# Patient Record
Sex: Male | Born: 1940
Health system: Southern US, Community
[De-identification: ages and names within clinical notes are randomized; demographics above are authoritative.]

## PROBLEM LIST (undated history)

## (undated) DIAGNOSIS — N029 Recurrent and persistent hematuria with unspecified morphologic changes: Secondary | ICD-10-CM

## (undated) DIAGNOSIS — I619 Nontraumatic intracerebral hemorrhage, unspecified: Secondary | ICD-10-CM

## (undated) DIAGNOSIS — I639 Cerebral infarction, unspecified: Secondary | ICD-10-CM

## (undated) DIAGNOSIS — A419 Sepsis, unspecified organism: Secondary | ICD-10-CM

## (undated) DIAGNOSIS — T380X5A Adverse effect of glucocorticoids and synthetic analogues, initial encounter: Secondary | ICD-10-CM

## (undated) DIAGNOSIS — I10 Essential (primary) hypertension: Secondary | ICD-10-CM

## (undated) DIAGNOSIS — F39 Unspecified mood [affective] disorder: Secondary | ICD-10-CM

## (undated) DIAGNOSIS — I209 Angina pectoris, unspecified: Secondary | ICD-10-CM

## (undated) DIAGNOSIS — C439 Malignant melanoma of skin, unspecified: Secondary | ICD-10-CM

## (undated) DIAGNOSIS — C719 Malignant neoplasm of brain, unspecified: Secondary | ICD-10-CM

## (undated) DIAGNOSIS — E7800 Pure hypercholesterolemia, unspecified: Secondary | ICD-10-CM

## (undated) DIAGNOSIS — N400 Enlarged prostate without lower urinary tract symptoms: Secondary | ICD-10-CM

## (undated) DIAGNOSIS — E039 Hypothyroidism, unspecified: Secondary | ICD-10-CM

## (undated) DIAGNOSIS — C4490 Unspecified malignant neoplasm of skin, unspecified: Secondary | ICD-10-CM

## (undated) DIAGNOSIS — I2510 Atherosclerotic heart disease of native coronary artery without angina pectoris: Secondary | ICD-10-CM

## (undated) DIAGNOSIS — G4730 Sleep apnea, unspecified: Secondary | ICD-10-CM

## (undated) DIAGNOSIS — R319 Hematuria, unspecified: Secondary | ICD-10-CM

## (undated) DIAGNOSIS — K219 Gastro-esophageal reflux disease without esophagitis: Secondary | ICD-10-CM

## (undated) DIAGNOSIS — K625 Hemorrhage of anus and rectum: Secondary | ICD-10-CM

## (undated) DIAGNOSIS — I219 Acute myocardial infarction, unspecified: Secondary | ICD-10-CM

## (undated) DIAGNOSIS — Z950 Presence of cardiac pacemaker: Secondary | ICD-10-CM

## (undated) DIAGNOSIS — N289 Disorder of kidney and ureter, unspecified: Secondary | ICD-10-CM

## (undated) DIAGNOSIS — C7800 Secondary malignant neoplasm of unspecified lung: Secondary | ICD-10-CM

## (undated) DIAGNOSIS — R739 Hyperglycemia, unspecified: Secondary | ICD-10-CM

## (undated) DIAGNOSIS — M1990 Unspecified osteoarthritis, unspecified site: Secondary | ICD-10-CM

## (undated) HISTORY — DX: Benign prostatic hyperplasia without lower urinary tract symptoms: N40.0

## (undated) HISTORY — PX: EYE SURGERY: SHX253

## (undated) HISTORY — PX: CARDIAC SURGERY: SHX584

## (undated) HISTORY — DX: Recurrent and persistent hematuria with unspecified morphologic changes: N02.9

## (undated) HISTORY — PX: TONSILLECTOMY: SUR1361

## (undated) HISTORY — PX: JOINT REPLACEMENT: SHX530

## (undated) HISTORY — DX: Hyperglycemia, unspecified: T38.0X5A

## (undated) HISTORY — DX: Nontraumatic intracerebral hemorrhage, unspecified: I61.9

## (undated) HISTORY — DX: Unspecified malignant neoplasm of skin, unspecified: C44.90

## (undated) HISTORY — PX: LYMPH NODE BIOPSY: SHX201

## (undated) HISTORY — DX: Adverse effect of glucocorticoids and synthetic analogues, initial encounter: R73.9

## (undated) HISTORY — DX: Secondary malignant neoplasm of unspecified lung: C78.00

## (undated) HISTORY — DX: Unspecified mood (affective) disorder: F39

## (undated) HISTORY — DX: Hematuria, unspecified: R31.9

## (undated) HISTORY — PX: PACEMAKER INSERTION: SHX728

## (undated) HISTORY — DX: Acute myocardial infarction, unspecified: I21.9

---
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
## 2006-02-15 ENCOUNTER — Encounter: Payer: Self-pay | Admitting: Cardiovascular Disease

## 2006-03-20 ENCOUNTER — Ambulatory Visit (HOSPITAL_BASED_OUTPATIENT_CLINIC_OR_DEPARTMENT_OTHER): Admission: RE | Admit: 2006-03-20 | Discharge: 2006-03-21 | Payer: Self-pay | Admitting: Orthopedic Surgery

## 2008-12-09 HISTORY — PX: KNEE ARTHROSCOPY: SUR90

## 2008-12-23 ENCOUNTER — Ambulatory Visit (HOSPITAL_COMMUNITY): Admission: RE | Admit: 2008-12-23 | Discharge: 2008-12-23 | Payer: Self-pay | Admitting: Orthopedic Surgery

## 2010-07-15 LAB — BASIC METABOLIC PANEL
BUN: 24 mg/dL — ABNORMAL HIGH (ref 6–23)
CO2: 28 mEq/L (ref 19–32)
Chloride: 108 mEq/L (ref 96–112)
Creatinine, Ser: 1.29 mg/dL (ref 0.4–1.5)
Glucose, Bld: 96 mg/dL (ref 70–99)

## 2010-07-15 LAB — HEMOGLOBIN AND HEMATOCRIT, BLOOD: Hemoglobin: 15.4 g/dL (ref 13.0–17.0)

## 2010-08-09 HISTORY — PX: FOOT SURGERY: SHX648

## 2010-08-26 NOTE — Op Note (Signed)
NAME:  SATVIK, PARCO NO.:  [id number]   MEDICAL RECORD NO.:  [id number]          PATIENT TYPE:  AMB   LOCATION:  DSC                          FACILITY:  [facility]   PHYSICIAN:  Leonides Grills, M.D.     DATE OF BIRTH:  May 20, 1940   DATE OF PROCEDURE:  03/20/2006  DATE OF DISCHARGE:                               OPERATIVE REPORT   PREOPERATIVE DIAGNOSES:  1. Right grade 2 posterior tibial tendon insufficiency with valgus      hindfoot malalignment.  2. Right posterior tibial tendon rupture.  3. Hypermobile right first ray.  4. Right tight gastroc.   POSTOPERATIVE DIAGNOSES:  1. Right grade 2 posterior tibial tendon insufficiency with valgus      hindfoot malalignment.  2. Right posterior tibial tendon rupture.  3. Hypermobile right first ray.  4. Right tight gastroc.   OPERATION:  1. Right lengthening calcaneal osteotomy.  2. Right iliac crest bone graft.  3. Right gastroc slide.  4. Right FDL2 navicular tendon transfer.  5. Right plantar flexion first TMP joint fusion with osteotomy.   ANESTHESIA:  General.   SURGEON:  Leonides Grills, M.D.   ASSISTANT:  Evlyn Kanner, PA-C.   ESTIMATED BLOOD LOSS:  Minimal.   TOURNIQUET TIME:  Two hours.   COMPLICATIONS:  None.   DISPOSITION:  Stable to PR.   INDICATIONS:  This is a 70 year old male who has had longstanding hind  foot pain that was interfering with his life to the point that he could  not do what he wants to do.  He was consented for the above procedure.  All risks which include infection, nerve or vessel injury, nonunion,  malunion, hardware rotation, hardware failure, persistence of the pain,  worsening of the pain, prolonged recovery, stiffness, arthritis,  weakness, bleeding, DVT, and possible PE were all explained.  Questions  were encouraged and answered.   DESCRIPTION OF PROCEDURE:  The patient was brought to the operating room  and placed in the supine position after adequate general  endotracheal  tube anesthesia was administered as well as Ancef 1 gram IV piggyback.  The right lower extremity as well as iliac crest bone graft site were  prepped and draped in a sterile manner over a proximally placed thigh  tourniquet.  We started the procedure with a longitudinal incision over  the iliac crest graft site.  Dissection was carried down through skin.  Hemostasis was obtained.  Dissection was carried down directly to crest.  Soft tissue was elevated on the inner and outer table.  An approximately  10 mm wide trapezoidal shaped tricortical graft was then obtained using  a sagittal saw followed by a curved quarter inch osteotome.  Once this  was removed cancellous graft was also obtained for the first TMT joint  fusion. Cancellous graft was placed on the back table as well for later  first TMT joint plantar flexion and fusion.   The area was copiously irrigated with normal saline.  Bone wax was  applied to the bony surfaces.  Marcaine was instilled into the wound.  Deep tissues were closed with  0Vicryl, the subcu was closed with 2-0  Vicryl, the skin was closed with 4-0 Monocryl subcuticular stitch.  Steri-Strips were applied.   We then made a longitudinal incision over the medial aspect of the  gastrocnemius muscle tendinous junction.  Dissection was carried down  through skin.  Hemostasis was obtained.  The fascia was opened in line  with the incision.  The conjoin region was then developed between the  gastroc and soleus.  We then lengthened the gastrocnemius by technique  described by Beverlyn Roux.  This had an excellent release of the tight  gastroc and improved dorsiflexion almost 20 degrees with the knee  extended.  The area was copiously irrigated with normal saline.  The  subcu was closed with 3-0 Vicryl.  The skin was closed with 4-0 Monocryl  subcuticular stitch.  Steri-Strips were applied.  The limb was gravity  exsanguinated.  Tourniquet was elevated to 290 mmHg.   A longitudinal  incision over the right post tibial tendon was then made.  Dissection  was carried down through the skin.  Hemostasis was obtained.  The flexor  retinaculum over the posterior tibial tendon was then incised.  The  posterior tibial tendon was completely ruptured and was actually vacant  from the area.  FDL tendon was then identified.  The distal stump of the  posterior tibial tendon was severely tendinosed and actually was almost  as hard as a rock.  This was also excised as well.   The navicular tuberosity was then prepared for transfer.  FDL tendon was  then traced past the knot of Henry and then tenotomized.  We then placed  a 3.5 x 12 x 6.0 mm drill into the navicular for later transfer.  The  tendon was left in the wound for later transfer after the lengthening  calcaneal osteotomy was then performed.  We then made a longitudinal  incision of the neck into the calcaneus laterally.  Dissection was  carried down through the skin.  Hemostasis was obtained.  Peroneal  tendons were protected plantarly. EDB was then elevated off the neck of  the calcaneus.  The CC joint was then entered and approximately 1.2 cm  proximal to this an osteotomy was then made within the neck of the  calcaneus parallel to the CC joint perpendicular to the lateral wall of  the calcaneus.  We then placed a large base lamina spreader into the  area and distracted and recreated the arch.  We then measured this out  with the caliper and this was 8 mm at its maximum width and then tapered  to about 6 to 5 mm at the tip of the trapezoid.  We then fashioned the  block of the tricortical bone to accommodate this perfectly.  This was  then tamped into place.  The lamina spreader was removed and this was  then affixed with a 3.5-mm fully threaded cortical set screw using a 2.5 mm drill hole respectively.  This had excellent purchase and maintenance  of the correction.  Stress x-rays were obtained in the AP  and lateral  planes.  This showed no gross motion, fixation and proper position and  correction excellent as well.  Clinically the arch was excellent.  When  looking at the hind foot the hind foot was in about 5 degrees of valgus  and perfect and he did not require a medializing calcaneal osteotomy.   We then made a longitudinal incision over the interval between the EHL  and  EHB.  Dissection was carried down through skin.  Hemostasis was  obtained.  Tendons were retracted out of harm's way.  The first TMT  joint was then entered.  We then performed an osteotomy of closing wedge  type through the base of the medial cuneiform.  Once this bone was  removed the joint was prepared.  This took quite some time.  Once this  was done and the correction was excellent.  We then placed some  cancellous graft that was obtained from the iliac crest graft and also  from the closing wedge osteotomy and packed this lightly as almost a  film in this area.  Multiple 2-mm drills were placed in the base of the  first metatarsal once the remaining cartilage was removed.  We then  placed a notch in the base of the first metatarsal approximately 1.5 cm  distal to the first TMT joint.  We then placed 2 two-point reduction  clamps on either side of the osteotomy fusion site and compressed this  area.  The forefoot was then palpated and felt to be in excellent  position.  The arch was excellent and the medial column was stabilized  beautifully.  We then placed a 3.5-mm fully threaded cortical lag screw  using 3.5, 2.5 long drills.  The screw had excellent purchase and  compression across the osteotomy/first ETMT joint fusion site.  We then  placed another screw just lateral to this from the medial cuneiform at  the base of the first metatarsal using a 3.5, 2.5 mm hole respectively.  Again this had excellent purchase and maintenance of the correction as  well as compression across the fusion site.  Stress strain  relieving  bone graft was then packed in using a bur followed by cancellous graft.  Final x-rays were obtained in AP and lateral planes.  This showed the  fixation proposition, no gross motion correction excellent as well  clinically.  The foot was in excellent alignment.  The medial column was  stabilized and he had an excellent arch postoperatively.   The area was copiously irrigated with normal saline.  Tourniquet was  deflated.  Hemostasis was obtained.  Prior to deflating the tourniquet  the FDL was pulled through a bone tunnel and the navicular tuberosity.  This was done using a Swanson suture passer and 2-0 FiberWire.  We then  sewed the tendon to the periosteum dorsally as well as to the portion of  the spring ligament plantarly as well as a portion of the distal aspect  of the posterior tibial tendon stump that was remained after the excision of the tendon.  This had an Conservation officer, historic buildings.  This was  repaired with 2-0 FiberWire stitches.  Tourniquet was deflated.  Hemostasis was obtained.  The retinaculum was closed with 2-0 Vicryl.  Subcu was closed with 3-0 Vicryl over all wounds.  Skin was closed with  4-0 nylon over all wounds.  Sterile dressing was applied.  A modified  Jones dressing was applied with the ankle in neutral dorsiflexion.  The  patient was stable to the PR.      Leonides Grills, M.D.  Electronically Signed     PB/MEDQ  D:  03/20/2006  T:  03/20/2006  Job:  [id number]

## 2011-04-11 HISTORY — PX: CORONARY ARTERY BYPASS GRAFT: SHX141

## 2011-04-11 HISTORY — PX: CARDIAC CATHETERIZATION: SHX172

## 2013-02-25 ENCOUNTER — Encounter: Payer: Self-pay | Admitting: Cardiovascular Disease

## 2015-08-22 ENCOUNTER — Inpatient Hospital Stay (HOSPITAL_COMMUNITY)
Admission: EM | Admit: 2015-08-22 | Discharge: 2015-08-26 | DRG: 545 | Disposition: A | Discharge status: Rehabilitation Facility | Payer: Medicare Other | Attending: Neurology | Admitting: Neurology

## 2015-08-22 ENCOUNTER — Encounter (HOSPITAL_COMMUNITY): Payer: Self-pay

## 2015-08-22 ENCOUNTER — Emergency Department (HOSPITAL_COMMUNITY): Payer: Medicare Other

## 2015-08-22 DIAGNOSIS — R4701 Aphasia: Secondary | ICD-10-CM | POA: Diagnosis present

## 2015-08-22 DIAGNOSIS — E854 Organ-limited amyloidosis: Secondary | ICD-10-CM | POA: Diagnosis not present

## 2015-08-22 DIAGNOSIS — R402362 Coma scale, best motor response, obeys commands, at arrival to emergency department: Secondary | ICD-10-CM | POA: Diagnosis present

## 2015-08-22 DIAGNOSIS — R402142 Coma scale, eyes open, spontaneous, at arrival to emergency department: Secondary | ICD-10-CM | POA: Diagnosis present

## 2015-08-22 DIAGNOSIS — C799 Secondary malignant neoplasm of unspecified site: Secondary | ICD-10-CM | POA: Diagnosis not present

## 2015-08-22 DIAGNOSIS — E785 Hyperlipidemia, unspecified: Secondary | ICD-10-CM | POA: Diagnosis present

## 2015-08-22 DIAGNOSIS — I619 Nontraumatic intracerebral hemorrhage, unspecified: Secondary | ICD-10-CM | POA: Diagnosis not present

## 2015-08-22 DIAGNOSIS — Z950 Presence of cardiac pacemaker: Secondary | ICD-10-CM | POA: Diagnosis not present

## 2015-08-22 DIAGNOSIS — I129 Hypertensive chronic kidney disease with stage 1 through stage 4 chronic kidney disease, or unspecified chronic kidney disease: Secondary | ICD-10-CM | POA: Diagnosis present

## 2015-08-22 DIAGNOSIS — N189 Chronic kidney disease, unspecified: Secondary | ICD-10-CM | POA: Diagnosis present

## 2015-08-22 DIAGNOSIS — I69151 Hemiplegia and hemiparesis following nontraumatic intracerebral hemorrhage affecting right dominant side: Secondary | ICD-10-CM | POA: Diagnosis not present

## 2015-08-22 DIAGNOSIS — Z6830 Body mass index (BMI) 30.0-30.9, adult: Secondary | ICD-10-CM | POA: Diagnosis not present

## 2015-08-22 DIAGNOSIS — I69398 Other sequelae of cerebral infarction: Secondary | ICD-10-CM | POA: Diagnosis not present

## 2015-08-22 DIAGNOSIS — E860 Dehydration: Secondary | ICD-10-CM | POA: Diagnosis present

## 2015-08-22 DIAGNOSIS — Z7289 Other problems related to lifestyle: Secondary | ICD-10-CM

## 2015-08-22 DIAGNOSIS — R29810 Facial weakness: Secondary | ICD-10-CM | POA: Diagnosis present

## 2015-08-22 DIAGNOSIS — R402242 Coma scale, best verbal response, confused conversation, at arrival to emergency department: Secondary | ICD-10-CM | POA: Diagnosis present

## 2015-08-22 DIAGNOSIS — Z9989 Dependence on other enabling machines and devices: Secondary | ICD-10-CM

## 2015-08-22 DIAGNOSIS — G4733 Obstructive sleep apnea (adult) (pediatric): Secondary | ICD-10-CM | POA: Diagnosis present

## 2015-08-22 DIAGNOSIS — I612 Nontraumatic intracerebral hemorrhage in hemisphere, unspecified: Secondary | ICD-10-CM

## 2015-08-22 DIAGNOSIS — N179 Acute kidney failure, unspecified: Secondary | ICD-10-CM | POA: Diagnosis present

## 2015-08-22 DIAGNOSIS — R0989 Other specified symptoms and signs involving the circulatory and respiratory systems: Secondary | ICD-10-CM | POA: Diagnosis not present

## 2015-08-22 DIAGNOSIS — Z0000 Encounter for general adult medical examination without abnormal findings: Secondary | ICD-10-CM

## 2015-08-22 DIAGNOSIS — I680 Cerebral amyloid angiopathy: Secondary | ICD-10-CM | POA: Diagnosis present

## 2015-08-22 DIAGNOSIS — I161 Hypertensive emergency: Secondary | ICD-10-CM | POA: Diagnosis present

## 2015-08-22 DIAGNOSIS — R29702 NIHSS score 2: Secondary | ICD-10-CM | POA: Diagnosis present

## 2015-08-22 DIAGNOSIS — R918 Other nonspecific abnormal finding of lung field: Secondary | ICD-10-CM | POA: Diagnosis present

## 2015-08-22 DIAGNOSIS — I160 Hypertensive urgency: Secondary | ICD-10-CM

## 2015-08-22 DIAGNOSIS — E669 Obesity, unspecified: Secondary | ICD-10-CM | POA: Diagnosis present

## 2015-08-22 DIAGNOSIS — E663 Overweight: Secondary | ICD-10-CM

## 2015-08-22 DIAGNOSIS — I10 Essential (primary) hypertension: Secondary | ICD-10-CM | POA: Diagnosis not present

## 2015-08-22 DIAGNOSIS — I629 Nontraumatic intracranial hemorrhage, unspecified: Secondary | ICD-10-CM

## 2015-08-22 DIAGNOSIS — G936 Cerebral edema: Secondary | ICD-10-CM | POA: Diagnosis present

## 2015-08-22 DIAGNOSIS — R4182 Altered mental status, unspecified: Secondary | ICD-10-CM | POA: Diagnosis not present

## 2015-08-22 DIAGNOSIS — Z951 Presence of aortocoronary bypass graft: Secondary | ICD-10-CM | POA: Diagnosis not present

## 2015-08-22 DIAGNOSIS — I611 Nontraumatic intracerebral hemorrhage in hemisphere, cortical: Secondary | ICD-10-CM | POA: Diagnosis present

## 2015-08-22 DIAGNOSIS — I369 Nonrheumatic tricuspid valve disorder, unspecified: Secondary | ICD-10-CM | POA: Diagnosis not present

## 2015-08-22 DIAGNOSIS — I69251 Hemiplegia and hemiparesis following other nontraumatic intracranial hemorrhage affecting right dominant side: Secondary | ICD-10-CM | POA: Diagnosis not present

## 2015-08-22 DIAGNOSIS — E66811 Obesity, class 1: Secondary | ICD-10-CM

## 2015-08-22 DIAGNOSIS — N182 Chronic kidney disease, stage 2 (mild): Secondary | ICD-10-CM | POA: Diagnosis not present

## 2015-08-22 DIAGNOSIS — Z95810 Presence of automatic (implantable) cardiac defibrillator: Secondary | ICD-10-CM | POA: Insufficient documentation

## 2015-08-22 DIAGNOSIS — N183 Chronic kidney disease, stage 3 (moderate): Secondary | ICD-10-CM | POA: Diagnosis not present

## 2015-08-22 DIAGNOSIS — I69120 Aphasia following nontraumatic intracerebral hemorrhage: Secondary | ICD-10-CM | POA: Diagnosis not present

## 2015-08-22 DIAGNOSIS — R32 Unspecified urinary incontinence: Secondary | ICD-10-CM

## 2015-08-22 HISTORY — DX: Essential (primary) hypertension: I10

## 2015-08-22 HISTORY — DX: Disorder of kidney and ureter, unspecified: N28.9

## 2015-08-22 HISTORY — DX: Presence of cardiac pacemaker: Z95.0

## 2015-08-22 HISTORY — DX: Pure hypercholesterolemia, unspecified: E78.00

## 2015-08-22 HISTORY — DX: Hemorrhage of anus and rectum: K62.5

## 2015-08-22 HISTORY — DX: Sleep apnea, unspecified: G47.30

## 2015-08-22 LAB — CBC
HEMATOCRIT: 47.1 % (ref 39.0–52.0)
HEMOGLOBIN: 16.4 g/dL (ref 13.0–17.0)
MCH: 32.9 pg (ref 26.0–34.0)
MCHC: 34.8 g/dL (ref 30.0–36.0)
MCV: 94.4 fL (ref 78.0–100.0)
Platelets: 159 10*3/uL (ref 150–400)
RBC: 4.99 MIL/uL (ref 4.22–5.81)
RDW: 15.2 % (ref 11.5–15.5)
WBC: 5.8 10*3/uL (ref 4.0–10.5)

## 2015-08-22 LAB — COMPREHENSIVE METABOLIC PANEL
ALBUMIN: 3.8 g/dL (ref 3.5–5.0)
ALT: 23 U/L (ref 17–63)
ANION GAP: 11 (ref 5–15)
AST: 29 U/L (ref 15–41)
Alkaline Phosphatase: 54 U/L (ref 38–126)
BILIRUBIN TOTAL: 1 mg/dL (ref 0.3–1.2)
BUN: 32 mg/dL — ABNORMAL HIGH (ref 6–20)
CO2: 23 mmol/L (ref 22–32)
Calcium: 9.7 mg/dL (ref 8.9–10.3)
Chloride: 106 mmol/L (ref 101–111)
Creatinine, Ser: 1.59 mg/dL — ABNORMAL HIGH (ref 0.61–1.24)
GFR calc Af Amer: 47 mL/min — ABNORMAL LOW (ref >60)
GFR, EST NON AFRICAN AMERICAN: 41 mL/min — AB (ref >60)
Glucose, Bld: 98 mg/dL (ref 65–99)
POTASSIUM: 4.7 mmol/L (ref 3.5–5.1)
Sodium: 140 mmol/L (ref 135–145)
TOTAL PROTEIN: 7.1 g/dL (ref 6.5–8.1)

## 2015-08-22 LAB — DIFFERENTIAL
BASOS ABS: 0 10*3/uL (ref 0.0–0.1)
BASOS PCT: 0 %
EOS ABS: 0.3 10*3/uL (ref 0.0–0.7)
EOS PCT: 5 %
LYMPHS ABS: 0.5 10*3/uL — AB (ref 0.7–4.0)
LYMPHS PCT: 9 %
MONOS PCT: 11 %
Monocytes Absolute: 0.6 10*3/uL (ref 0.1–1.0)
Neutro Abs: 4.4 10*3/uL (ref 1.7–7.7)
Neutrophils Relative %: 75 %

## 2015-08-22 LAB — I-STAT TROPONIN, ED: TROPONIN I, POC: 0.02 ng/mL (ref 0.00–0.08)

## 2015-08-22 LAB — I-STAT CHEM 8, ED
BUN: 36 mg/dL — ABNORMAL HIGH (ref 6–20)
CALCIUM ION: 1.22 mmol/L (ref 1.13–1.30)
Chloride: 104 mmol/L (ref 101–111)
Creatinine, Ser: 1.5 mg/dL — ABNORMAL HIGH (ref 0.61–1.24)
Glucose, Bld: 93 mg/dL (ref 65–99)
HCT: 51 % (ref 39.0–52.0)
HEMOGLOBIN: 17.3 g/dL — AB (ref 13.0–17.0)
Potassium: 4.7 mmol/L (ref 3.5–5.1)
SODIUM: 142 mmol/L (ref 135–145)
TCO2: 22 mmol/L (ref 0–100)

## 2015-08-22 LAB — CBG MONITORING, ED: GLUCOSE-CAPILLARY: 94 mg/dL (ref 65–99)

## 2015-08-22 LAB — PROTIME-INR
INR: 1.03 (ref 0.00–1.49)
Prothrombin Time: 13.7 seconds (ref 11.6–15.2)

## 2015-08-22 LAB — APTT: aPTT: 32 seconds (ref 24–37)

## 2015-08-22 MED ORDER — SODIUM CHLORIDE 0.9 % IV BOLUS (SEPSIS)
500.0000 mL | Freq: Once | INTRAVENOUS | Status: AC
  Administered 2015-08-22: 500 mL via INTRAVENOUS

## 2015-08-22 MED ORDER — ASPIRIN 81 MG PO CHEW: 324.0000 mg | CHEWABLE_TABLET | Freq: Once | ORAL | Status: DC

## 2015-08-22 MED ORDER — THIAMINE HCL 100 MG/ML IJ SOLN
100.0000 mg | Freq: Once | INTRAMUSCULAR | Status: AC
  Administered 2015-08-22: 100 mg via INTRAVENOUS
  Filled 2015-08-22: qty 2

## 2015-08-22 MED ORDER — NICARDIPINE HCL IN NACL 20-0.86 MG/200ML-% IV SOLN
5.0000 mg/h | Freq: Once | INTRAVENOUS | Status: AC
  Administered 2015-08-22: 5 mg/h via INTRAVENOUS
  Filled 2015-08-22: qty 200

## 2015-08-22 NOTE — ED Provider Notes (Signed)
CSN: [id number]     Arrival date & time 08/22/15  1943 History   First MD Initiated Contact with Patient 08/22/15 2014     Chief Complaint  Patient presents with  . Altered Mental Status     (Consider location/radiation/quality/duration/timing/severity/associated sxs/prior Treatment) HPI Comments: 75 year male with history of high blood pressure, regular alcohol use, pacemaker, renal disorder presents with confusion and speech difficulty since last night. No history of stroke. No unilateral weakness. No fevers chills or recent infections. Patient had alcohol last night and will drink up to 6 beers and one occasion. Patient had normal discussion with family Saturday evening regarding politics. Symptoms constant  Patient is a 75 y.o. male presenting with altered mental status. The history is provided by the patient and a relative.  Altered Mental Status Presenting symptoms: confusion   Associated symptoms: no abdominal pain, no fever, no headaches, no light-headedness, no rash and no vomiting     Past Medical History  Diagnosis Date  . Hypertension   . Sleep apnea   . High cholesterol   . Renal disorder   . Pacemaker    Past Surgical History  Procedure Laterality Date  . Cardiac surgery     History reviewed. No pertinent family history. Social History  Substance Use Topics  . Smoking status: Never Smoker   . Smokeless tobacco: None  . Alcohol Use: Yes    Review of Systems  Constitutional: Negative for fever and chills.  HENT: Negative for congestion.   Eyes: Negative for visual disturbance.  Respiratory: Negative for shortness of breath.   Cardiovascular: Negative for chest pain.  Gastrointestinal: Negative for vomiting and abdominal pain.  Genitourinary: Negative for dysuria and flank pain.  Musculoskeletal: Negative for back pain, neck pain and neck stiffness.  Skin: Negative for rash.  Neurological: Positive for speech difficulty. Negative for light-headedness and  headaches.  Psychiatric/Behavioral: Positive for confusion.      Allergies  Review of patient's allergies indicates no known allergies.  Home Medications   Prior to Admission medications   Medication Sig Start Date End Date Taking? Authorizing Provider  PRESCRIPTION MEDICATION Inhale into the lungs at bedtime. CPAP   Yes Historical Provider, MD  TURMERIC PO Take 1 capsule by mouth 2 (two) times daily.   Yes Historical Provider, MD   BP 158/108 mmHg  Pulse 77  Temp(Src) 98.2 F (36.8 C) (Oral)  Resp 23  Ht 5' 10.5" (1.791 m)  Wt 215 lb 9 oz (97.779 kg)  BMI 30.48 kg/m2  SpO2 92% Physical Exam  Constitutional: He appears well-developed and well-nourished.  HENT:  Head: Normocephalic and atraumatic.  Eyes: Conjunctivae are normal. Right eye exhibits no discharge. Left eye exhibits no discharge.  Neck: Normal range of motion. Neck supple. No tracheal deviation present.  Cardiovascular: Normal rate and regular rhythm.   Pulmonary/Chest: Effort normal and breath sounds normal.  Abdominal: Soft. He exhibits no distension. There is no tenderness. There is no guarding.  Musculoskeletal: He exhibits no edema.  Neurological: He is alert. GCS eye subscore is 4. GCS verbal subscore is 4. GCS motor subscore is 6.  Patient mild global confusion, receptive aphasia. Extraocular muscle function intact, equal 5+ strength upper lower extremities or sensation intact. Finger nose intact. Patient at times requires repeat instructions to complete a task. Patient did not know his date of birth or city.  Skin: Skin is warm. No rash noted.  Psychiatric: He has a normal mood and affect.  Nursing note and vitals  reviewed.   ED Course  Procedures (including critical care time) CRITICAL CARE Performed by: Mariea Clonts   Total critical care time: 35 minutes  Critical care time was exclusive of separately billable procedures and treating other patients.  Critical care was necessary to treat or  prevent imminent or life-threatening deterioration.  Critical care was time spent personally by me on the following activities: development of treatment plan with patient and/or surrogate as well as nursing, discussions with consultants, evaluation of patient's response to treatment, examination of patient, obtaining history from patient or surrogate, ordering and performing treatments and interventions, ordering and review of laboratory studies, ordering and review of radiographic studies, pulse oximetry and re-evaluation of patient's condition.  Labs Review Labs Reviewed  DIFFERENTIAL - Abnormal; Notable for the following:    Lymphs Abs 0.5 (*)    All other components within normal limits  COMPREHENSIVE METABOLIC PANEL - Abnormal; Notable for the following:    BUN 32 (*)    Creatinine, Ser 1.59 (*)    GFR calc non Af Amer 41 (*)    GFR calc Af Amer 47 (*)    All other components within normal limits  I-STAT CHEM 8, ED - Abnormal; Notable for the following:    BUN 36 (*)    Creatinine, Ser 1.50 (*)    Hemoglobin 17.3 (*)    All other components within normal limits  PROTIME-INR  APTT  CBC  I-STAT TROPOININ, ED  CBG MONITORING, ED    Imaging Review Ct Head Wo Contrast  08/22/2015  CLINICAL DATA:  75 year old male stents with stroke-like symptoms. EXAM: CT HEAD WITHOUT CONTRAST TECHNIQUE: Contiguous axial images were obtained from the base of the skull through the vertex without intravenous contrast. COMPARISON:  None. FINDINGS: There is a 5.7 x 4.5 cm left frontal intraparenchymal acute hemorrhage. There is edema in the surrounding brain parenchyma. There is associated mild mass effect and compression of the frontal horn of the left lateral ventricle. This is a 6 mm left-to-right midline shift is noted. No other acute hemorrhage identified. There is mild prominence of the ventricles and sulci compatible with age-related atrophy. Periventricular and deep white matter chronic microvascular  ischemic changes noted. The visualized paranasal sinuses and mastoid air cells are clear. The calvarium is intact. IMPRESSION: Large left frontal lobe intraparenchymal acute hemorrhage with associated mass effect and approximately 6 mm left-to-right midline shift. These results were called by telephone at the time of interpretation on 08/22/2015 at 10:03 pm to Dr. Elnora Morrison , who verbally acknowledged these results. Electronically Signed   By: Anner Crete M.D.   On: 08/22/2015 22:07   I have personally reviewed and evaluated these images and lab results as part of my medical decision-making.   EKG Interpretation   Date/Time:  Sunday Aug 22 2015 19:54:38 EDT Ventricular Rate:  77 PR Interval:  156 QRS Duration: 196 QT Interval:  450 QTC Calculation: 509 R Axis:   -115 Text Interpretation:  Normal sinus rhythm Ventricular pre-excitation, WPW  pattern type A Abnormal ECG paced. Confirmed by Johnney Killian, MD, Jeannie Done  [phone]) on 08/22/2015 7:59:34 PM      MDM   Final diagnoses:  ICB (intracranial bleed) [facility])   Patient presents with clinical concern for stroke that started last night. Plan for stroke evaluation with CT scan of the head. Discussed with neurology and plan for admission to medicine. Patient will be given aspirin if no bleeding on CT head.  Reviewed CT after complete, discussed with  NSGY, plan for neuro admit, bp control. Updated family.  The patients results and plan were reviewed and discussed.   Any x-rays performed were independently reviewed by myself.   Differential diagnosis were considered with the presenting HPI.  Medications  nicardipine (CARDENE) '20mg'$  in 0.86% saline 284m IV infusion (0.1 mg/ml) (not administered)  thiamine (B-1) injection 100 mg (100 mg Intravenous Given 08/22/15 2133)  sodium chloride 0.9 % bolus 500 mL (500 mLs Intravenous New Bag/Given 08/22/15 2133)    Filed Vitals:   08/22/15 1947 08/22/15 2045 08/22/15 2100 08/22/15 2115  BP:  174/103 171/102 164/108 158/108  Pulse: 79 76 74 77  Temp: 98.2 F (36.8 C)     TempSrc: Oral     Resp: '20 17 16 23  '$ Height: 5' 10.5" (1.791 m)     Weight: 215 lb 9 oz (97.779 kg)     SpO2: 93% 94% 94% 92%    Final diagnoses:  ICB (intracranial bleed) (HCC)    Admission/ observation were discussed with the admitting physician, patient and/or family and they are comfortable with the plan.     JElnora Morrison MD 08/27/15 [phone]

## 2015-08-22 NOTE — ED Notes (Signed)
Per visitor with patient, pt more confused and per daughter has flattened nasolabial folds, pt with equal grip strengths. Onset Saturday.

## 2015-08-22 NOTE — H&P (Signed)
Admission H&P    Chief Complaint: New onset speech output difficulty and right facial droop.  HPI: David [name] is an 75 y.o. male history of hypertension, hypercholesterolemia, sleep apnea, renal disorder and pacemaker placement for AV node disorder, brought to the emergency room with new onset speech output difficulty and right lower facial weakness. He said difficulty with word finding as well as paraphasic errors. Onset was at 7 PM on 08/21/2015. He has no previous history of stroke nor TIA. He has not been on an anticoagulant. CT scan showed a 5.7 x 4.5 cm left frontal intraparenchymal hemorrhage. 6 mm left-to-right midline shift was also noted. Patient has had no receptive aphasia. He also has had no weakness involving right extremities and no visual changes. Patient's blood pressure was elevated at 176/111 on presentation. Cardene drip was started for management of hypertensive urgency.  LSN: 10 PM on 08/21/2015 tPA Given: No: Acute ICH mRankin:  Past Medical History  Diagnosis Date  . Hypertension   . Sleep apnea   . High cholesterol   . Renal disorder   . Pacemaker     Past Surgical History  Procedure Laterality Date  . Cardiac surgery      History reviewed. No pertinent family history. Social History:  reports that he has never smoked. He does not have any smokeless tobacco history on file. He reports that he drinks alcohol. He reports that he does not use illicit drugs.  Allergies: No Known Allergies  Medications: Preadmission medications were reviewed by me.  ROS: History obtained from patient and spouse.  General ROS: negative for - chills, fatigue, fever, night sweats, weight gain or weight loss Psychological ROS: negative for - behavioral disorder, hallucinations, memory difficulties, mood swings or suicidal ideation Ophthalmic ROS: negative for - blurry vision, double vision, eye pain or loss of vision ENT ROS: negative for - epistaxis, nasal discharge, oral  lesions, sore throat, tinnitus or vertigo Allergy and Immunology ROS: negative for - hives or itchy/watery eyes Hematological and Lymphatic ROS: negative for - bleeding problems, bruising or swollen lymph nodes Endocrine ROS: negative for - galactorrhea, hair pattern changes, polydipsia/polyuria or temperature intolerance Respiratory ROS: negative for - cough, hemoptysis, shortness of breath or wheezing Cardiovascular ROS: negative for - chest pain, dyspnea on exertion, edema or irregular heartbeat Gastrointestinal ROS: negative for - abdominal pain, diarrhea, hematemesis, nausea/vomiting or stool incontinence Genito-Urinary ROS: negative for - dysuria, hematuria, incontinence or urinary frequency/urgency Musculoskeletal ROS: negative for - joint swelling or muscular weakness Neurological ROS: as noted in HPI Dermatological ROS: negative for rash and skin lesion changes  Physical Examination: Blood pressure 154/88, pulse 77, temperature 98.2 F (36.8 C), temperature source Oral, resp. rate 23, height 5' 10.5" (1.791 m), weight 97.779 kg (215 lb 9 oz), SpO2 92 %.  HEENT-  Normocephalic, no lesions, without obvious abnormality.  Normal external eye and conjunctiva.  Normal TM's bilaterally.  Normal auditory canals and external ears. Normal external nose, mucus membranes and septum.  Normal pharynx. Neck supple with no masses, nodes, nodules or enlargement. Cardiovascular - regular rate and rhythm, S1, S2 normal, no murmur, click, rub or gallop Lungs - chest clear, no wheezing, rales, normal symmetric air entry Abdomen - soft, non-tender; bowel sounds normal; no masses,  no organomegaly Extremities - no joint deformities, effusion, or inflammation and no edema  Neurologic Examination: Mental Status: Alert, oriented, thought content appropriate.  Moderate word finding difficulty and early frequent paraphasic errors. No receptive aphasia. Able to follow commands without  difficulty. Cranial  Nerves: II-Visual fields were normal. III/IV/VI-Pupils were equal and reacted normally to light. Extraocular movements were full and conjugate.    V/VII-no facial numbness and no facial weakness. VIII-normal. X-normal speech and symmetrical palatal movement. XI: trapezius strength/neck flexion strength normal bilaterally XII-midline tongue extension with normal strength. Motor: 5/5 bilaterally with normal tone and bulk Sensory: Normal throughout. Deep Tendon Reflexes: 1+ and symmetric. Plantars: Mute bilaterally Cerebellar: Normal finger-to-nose testing. Carotid auscultation: Normal  Results for orders placed or performed during the hospital encounter of 08/22/15 (from the past 48 hour(s))  Protime-INR     Status: None   Collection Time: 08/22/15  8:24 PM  Result Value Ref Range   Prothrombin Time 13.7 11.6 - 15.2 seconds   INR 1.03 0.00 - 1.49  APTT     Status: None   Collection Time: 08/22/15  8:24 PM  Result Value Ref Range   aPTT 32 24 - 37 seconds  CBC     Status: None   Collection Time: 08/22/15  8:24 PM  Result Value Ref Range   WBC 5.8 4.0 - 10.5 K/uL   RBC 4.99 4.22 - 5.81 MIL/uL   Hemoglobin 16.4 13.0 - 17.0 g/dL   HCT 47.1 39.0 - 52.0 %   MCV 94.4 78.0 - 100.0 fL   MCH 32.9 26.0 - 34.0 pg   MCHC 34.8 30.0 - 36.0 g/dL   RDW 15.2 11.5 - 15.5 %   Platelets 159 150 - 400 K/uL  Differential     Status: Abnormal   Collection Time: 08/22/15  8:24 PM  Result Value Ref Range   Neutrophils Relative % 75 %   Neutro Abs 4.4 1.7 - 7.7 K/uL   Lymphocytes Relative 9 %   Lymphs Abs 0.5 (L) 0.7 - 4.0 K/uL   Monocytes Relative 11 %   Monocytes Absolute 0.6 0.1 - 1.0 K/uL   Eosinophils Relative 5 %   Eosinophils Absolute 0.3 0.0 - 0.7 K/uL   Basophils Relative 0 %   Basophils Absolute 0.0 0.0 - 0.1 K/uL  Comprehensive metabolic panel     Status: Abnormal   Collection Time: 08/22/15  8:24 PM  Result Value Ref Range   Sodium 140 135 - 145 mmol/L   Potassium 4.7 3.5 - 5.1  mmol/L   Chloride 106 101 - 111 mmol/L   CO2 23 22 - 32 mmol/L   Glucose, Bld 98 65 - 99 mg/dL   BUN 32 (H) 6 - 20 mg/dL   Creatinine, Ser 1.59 (H) 0.61 - 1.24 mg/dL   Calcium 9.7 8.9 - 10.3 mg/dL   Total Protein 7.1 6.5 - 8.1 g/dL   Albumin 3.8 3.5 - 5.0 g/dL   AST 29 15 - 41 U/L   ALT 23 17 - 63 U/L   Alkaline Phosphatase 54 38 - 126 U/L   Total Bilirubin 1.0 0.3 - 1.2 mg/dL   GFR calc non Af Amer 41 (L) >60 mL/min   GFR calc Af Amer 47 (L) >60 mL/min    Comment: (NOTE) The eGFR has been calculated using the CKD EPI equation. This calculation has not been validated in all clinical situations. eGFR's persistently <60 mL/min signify possible Chronic Kidney Disease.    Anion gap 11 5 - 15  I-stat troponin, ED     Status: None   Collection Time: 08/22/15  8:46 PM  Result Value Ref Range   Troponin i, poc 0.02 0.00 - 0.08 ng/mL   Comment 3  Comment: Due to the release kinetics of cTnI, a negative result within the first hours of the onset of symptoms does not rule out myocardial infarction with certainty. If myocardial infarction is still suspected, repeat the test at appropriate intervals.   I-Stat Chem 8, ED     Status: Abnormal   Collection Time: 08/22/15  8:48 PM  Result Value Ref Range   Sodium 142 135 - 145 mmol/L   Potassium 4.7 3.5 - 5.1 mmol/L   Chloride 104 101 - 111 mmol/L   BUN 36 (H) 6 - 20 mg/dL   Creatinine, Ser 1.50 (H) 0.61 - 1.24 mg/dL   Glucose, Bld 93 65 - 99 mg/dL   Calcium, Ion 1.22 1.13 - 1.30 mmol/L   TCO2 22 0 - 100 mmol/L   Hemoglobin 17.3 (H) 13.0 - 17.0 g/dL   HCT 51.0 39.0 - 52.0 %   Ct Head Wo Contrast  08/22/2015  CLINICAL DATA:  75 year old male stents with stroke-like symptoms. EXAM: CT HEAD WITHOUT CONTRAST TECHNIQUE: Contiguous axial images were obtained from the base of the skull through the vertex without intravenous contrast. COMPARISON:  None. FINDINGS: There is a 5.7 x 4.5 cm left frontal intraparenchymal acute  hemorrhage. There is edema in the surrounding brain parenchyma. There is associated mild mass effect and compression of the frontal horn of the left lateral ventricle. This is a 6 mm left-to-right midline shift is noted. No other acute hemorrhage identified. There is mild prominence of the ventricles and sulci compatible with age-related atrophy. Periventricular and deep white matter chronic microvascular ischemic changes noted. The visualized paranasal sinuses and mastoid air cells are clear. The calvarium is intact. IMPRESSION: Large left frontal lobe intraparenchymal acute hemorrhage with associated mass effect and approximately 6 mm left-to-right midline shift. These results were called by telephone at the time of interpretation on 08/22/2015 at 10:03 pm to Dr. Elnora Morrison , who verbally acknowledged these results. Electronically Signed   By: Anner Crete M.D.   On: 08/22/2015 22:07    Assessment: 75 y.o. male with a history of hypertension and hyperlipidemia presenting with a large left frontal intraparenchymal hemorrhage, most likely secondary to hypertension. Patient has moderate expressive aphasia and no receptive aphasia. He has no other deficits other than mild right lower facial droop.  Stroke Risk Factors - hyperlipidemia and hypertension  Plan: 1. HgbA1c, fasting lipid panel 2. Speech consult 3. Echocardiogram 4. CT angiogram of head and neck with contrast 5. Prophylactic therapy-None 6. Risk factor modification 7. Telemetry monitoring  C.R. Nicole Kindred, MD [facility] [phone]  08/22/2015, 10:36 PM

## 2015-08-23 ENCOUNTER — Inpatient Hospital Stay (HOSPITAL_COMMUNITY): Payer: Medicare Other

## 2015-08-23 DIAGNOSIS — E785 Hyperlipidemia, unspecified: Secondary | ICD-10-CM

## 2015-08-23 DIAGNOSIS — R0989 Other specified symptoms and signs involving the circulatory and respiratory systems: Secondary | ICD-10-CM

## 2015-08-23 DIAGNOSIS — I369 Nonrheumatic tricuspid valve disorder, unspecified: Secondary | ICD-10-CM

## 2015-08-23 LAB — BASIC METABOLIC PANEL
ANION GAP: 10 (ref 5–15)
BUN: 24 mg/dL — AB (ref 6–20)
CALCIUM: 9.2 mg/dL (ref 8.9–10.3)
CO2: 24 mmol/L (ref 22–32)
Chloride: 104 mmol/L (ref 101–111)
Creatinine, Ser: 1.24 mg/dL (ref 0.61–1.24)
GFR calc Af Amer: >60 mL/min (ref >60)
GFR, EST NON AFRICAN AMERICAN: 55 mL/min — AB (ref >60)
GLUCOSE: 108 mg/dL — AB (ref 65–99)
POTASSIUM: 4.6 mmol/L (ref 3.5–5.1)
Sodium: 138 mmol/L (ref 135–145)

## 2015-08-23 LAB — ECHOCARDIOGRAM COMPLETE
Height: 70.5 in
WEIGHTICAEL: 3449 [oz_av]

## 2015-08-23 LAB — CBC
HEMATOCRIT: 44.1 % (ref 39.0–52.0)
Hemoglobin: 14.9 g/dL (ref 13.0–17.0)
MCH: 31.8 pg (ref 26.0–34.0)
MCHC: 33.8 g/dL (ref 30.0–36.0)
MCV: 94 fL (ref 78.0–100.0)
PLATELETS: 140 10*3/uL — AB (ref 150–400)
RBC: 4.69 MIL/uL (ref 4.22–5.81)
RDW: 15.2 % (ref 11.5–15.5)
WBC: 4.8 10*3/uL (ref 4.0–10.5)

## 2015-08-23 LAB — MRSA PCR SCREENING: MRSA by PCR: NEGATIVE

## 2015-08-23 LAB — VITAMIN B12: VITAMIN B 12: 471 pg/mL (ref 180–914)

## 2015-08-23 LAB — TSH: TSH: 3.902 u[IU]/mL (ref 0.350–4.500)

## 2015-08-23 MED ORDER — SENNOSIDES-DOCUSATE SODIUM 8.6-50 MG PO TABS
1.0000 | ORAL_TABLET | Freq: Two times a day (BID) | ORAL | Status: DC
  Administered 2015-08-23 – 2015-08-26 (×6): 1 via ORAL
  Filled 2015-08-23 (×6): qty 1

## 2015-08-23 MED ORDER — LISINOPRIL 5 MG PO TABS: 5.0000 mg | ORAL_TABLET | Freq: Two times a day (BID) | ORAL | Status: DC

## 2015-08-23 MED ORDER — ACETAMINOPHEN 325 MG PO TABS
650.0000 mg | ORAL_TABLET | ORAL | Status: DC | PRN
  Administered 2015-08-25: 650 mg via ORAL
  Filled 2015-08-23: qty 2

## 2015-08-23 MED ORDER — ACETAMINOPHEN 650 MG RE SUPP: 650.0000 mg | RECTAL | Status: DC | PRN

## 2015-08-23 MED ORDER — SODIUM CHLORIDE 0.9 % IV SOLN
INTRAVENOUS | Status: DC
Start: 2015-08-23 — End: 2015-08-24
  Administered 2015-08-23 – 2015-08-24 (×2): via INTRAVENOUS

## 2015-08-23 MED ORDER — LISINOPRIL 10 MG PO TABS
10.0000 mg | ORAL_TABLET | Freq: Two times a day (BID) | ORAL | Status: DC
  Administered 2015-08-23 – 2015-08-26 (×6): 10 mg via ORAL
  Filled 2015-08-23 (×6): qty 1

## 2015-08-23 MED ORDER — CARVEDILOL 3.125 MG PO TABS
3.1250 mg | ORAL_TABLET | Freq: Two times a day (BID) | ORAL | Status: DC
  Administered 2015-08-23 – 2015-08-25 (×4): 3.125 mg via ORAL
  Filled 2015-08-23 (×4): qty 1

## 2015-08-23 MED ORDER — LABETALOL HCL 5 MG/ML IV SOLN
10.0000 mg | INTRAVENOUS | Status: DC | PRN
  Administered 2015-08-23 – 2015-08-24 (×3): 10 mg via INTRAVENOUS
  Filled 2015-08-23 (×3): qty 4

## 2015-08-23 MED ORDER — NICARDIPINE HCL IN NACL 20-0.86 MG/200ML-% IV SOLN
3.0000 mg/h | INTRAVENOUS | Status: DC
  Administered 2015-08-23: 7.5 mg/h via INTRAVENOUS
  Administered 2015-08-23: 10 mg/h via INTRAVENOUS
  Filled 2015-08-23 (×2): qty 200

## 2015-08-23 MED ORDER — IOPAMIDOL (ISOVUE-370) INJECTION 76%
INTRAVENOUS | Status: AC
  Administered 2015-08-23: 50 mL
  Filled 2015-08-23: qty 50

## 2015-08-23 MED ORDER — PANTOPRAZOLE SODIUM 40 MG IV SOLR
40.0000 mg | Freq: Every day | INTRAVENOUS | Status: DC
  Administered 2015-08-23: 40 mg via INTRAVENOUS
  Filled 2015-08-23: qty 40

## 2015-08-23 MED ORDER — STROKE: EARLY STAGES OF RECOVERY BOOK
Freq: Once | Status: AC
  Administered 2015-08-23: 01:00:00
  Filled 2015-08-23: qty 1

## 2015-08-23 NOTE — Progress Notes (Signed)
*  PRELIMINARY RESULTS* Vascular Ultrasound Carotid Duplex (Doppler) has been completed.   There is no obvious evidence of hemodynamically significant internal carotid artery stenosis bilaterally. Vertebral arteries are patent with antegrade flow.  08/23/2015 11:28 AM Maudry Mayhew, RVT, RDCS, RDMS

## 2015-08-23 NOTE — Progress Notes (Signed)
STROKE TEAM PROGRESS NOTE   HISTORY OF PRESENT ILLNESS David [name] is an 75 y.o. male history of hypertension, hypercholesterolemia, sleep apnea, renal disorder and pacemaker placement for AV node disorder, brought to the emergency room with new onset speech output difficulty and right lower facial weakness. He said difficulty with word finding as well as paraphasic errors. Onset was at 7 PM on 08/21/2015. He has no previous history of stroke nor TIA. He has not been on an anticoagulant. CT scan showed a 5.7 x 4.5 cm left frontal intraparenchymal hemorrhage. 6 mm left-to-right midline shift was also noted. Patient has had no receptive aphasia. He also has had no weakness involving right extremities and no visual changes. Patient's blood pressure was elevated at 176/111 on presentation. Cardene drip was started for management of hypertensive urgency.  LSN: 10 PM on 08/21/2015 tPA Given: No: Acute ICH mRankin:   SUBJECTIVE (INTERVAL HISTORY) His daughter and wife are at the bedside.  Overall he feels his condition is stable. He still has partial expressive aphasia but otherwise intact. No HA. Pending CT and CTA head. Passed swallow.   OBJECTIVE Temp:  [97.6 F (36.4 C)-98.2 F (36.8 C)] 97.6 F (36.4 C) (05/15 0800) Pulse Rate:  [74-88] 75 (05/15 1000) Cardiac Rhythm:  [-] Atrial paced (05/15 0800) Resp:  [11-24] 11 (05/15 1000) BP: (115-176)/(66-111) 155/90 mmHg (05/15 1000) SpO2:  [90 %-98 %] 94 % (05/15 1000) Weight:  [97.779 kg (215 lb 9 oz)] 97.779 kg (215 lb 9 oz) (05/14 1947)  CBC:  Recent Labs Lab 08/22/15 2024 08/22/15 2048  WBC 5.8  --   NEUTROABS 4.4  --   HGB 16.4 17.3*  HCT 47.1 51.0  MCV 94.4  --   PLT 159  --     Basic Metabolic Panel:  Recent Labs Lab 08/22/15 2024 08/22/15 2048  NA 140 142  K 4.7 4.7  CL 106 104  CO2 23  --   GLUCOSE 98 93  BUN 32* 36*  CREATININE 1.59* 1.50*  CALCIUM 9.7  --     Lipid Panel: No results found for: CHOL, TRIG,  HDL, CHOLHDL, VLDL, LDLCALC HgbA1c: No results found for: HGBA1C Urine Drug Screen: No results found for: LABOPIA, COCAINSCRNUR, LABBENZ, AMPHETMU, THCU, LABBARB    IMAGING  Ct Head Wo Contrast 08/22/2015   Large left frontal lobe intraparenchymal acute hemorrhage with associated mass effect and approximately 6 mm left-to-right midline shift.   Chest Port 1 View 08/23/2015   Apparent left lower lobe mass measuring 3.8 x 3.0 cm. Advise correlation with chest CT, ideally with intravenous contrast, to further evaluate. Lungs elsewhere clear. Heart mildly enlarged with pacemaker leads as described. Patient is also status post coronary artery bypass grafting.   CTA head - pending  CUS - There is no obvious evidence of hemodynamically significant internal carotid artery stenosis bilaterally. Vertebral arteries are patent with antegrade flow.  TTE - pending   PHYSICAL EXAM  Temp:  [97.6 F (36.4 C)-98.2 F (36.8 C)] 97.7 F (36.5 C) (05/15 1200) Pulse Rate:  [74-88] 78 (05/15 1500) Resp:  [11-24] 12 (05/15 1500) BP: (115-176)/(66-111) 153/94 mmHg (05/15 1500) SpO2:  [90 %-98 %] 98 % (05/15 1500) Weight:  [215 lb 9 oz (97.779 kg)] 215 lb 9 oz (97.779 kg) (05/14 1947)  General - Well nourished, well developed, in no apparent distress.  Ophthalmologic - Fundi not visualized due to noncooperation.  Cardiovascular - Regular rate and rhythm with no murmur.  Mental Status -  Level of arousal and orientation to time, place, and person were intact. Language exam showed partial expressive aphasia, difficulty with naming and complex sentence repeating. Intact on following commands.   Cranial Nerves II - XII - II - Visual field intact OU. III, IV, VI - Extraocular movements intact. V - Facial sensation intact bilaterally. VII - Facial movement intact bilaterally. VIII - Hearing & vestibular intact bilaterally. X - Palate elevates symmetrically. XI - Chin turning & shoulder shrug intact  bilaterally. XII - Tongue protrusion intact.  Motor Strength - The patient's strength was normal in all extremities and pronator drift was absent.  Bulk was normal and fasciculations were absent.   Motor Tone - Muscle tone was assessed at the neck and appendages and was normal.  Reflexes - The patient's reflexes were 1+ in all extremities and he had no pathological reflexes.  Sensory - Light touch, temperature/pinprick were assessed and were symmetrical.    Coordination - The patient had normal movements in the hands with no ataxia or dysmetria.  Tremor was absent.  Gait and Station - deferred due to safety concerns.   ASSESSMENT/PLAN [name] is a 75 y.o. male with history of hypertension, OSA, hyperlipidemia, CKD, and s/p permanent pacemaker presenting with speech difficulties and elevated blood pressure. He did not receive IV t-PA due to intraparenchymal hemorrhage.  Stroke:  Left frontal ICH likely secondary to hypertension vs. CAA.  Resultant  Partial expressive aphasia  CT head - left frontal large ICH  CTA of head - pending  Carotid Doppler - no significant stenosis  2D Echo - pending  LDL - pending  HgbA1c pending  VTE prophylaxis - SCDs  Diet NPO time specified  No antithrombotic prior to admission, now on No antithrombotic secondary to hemorrhage  Ongoing aggressive stroke risk factor management  Therapy recommendations: Pending  Disposition: Pending  Cerebral edema  Closing monitoring  Consider 3% saline if needed.  Hypertension  Blood pressure mildly high  BP goal < 160  cardene PRN  Put back on coreg and lisinopril home meds  Hyperlipidemia  Home meds:  Crestor 10 mg daily currently on hold  LDL pending, goal < 70  Continue statin at discharge  Other Stroke Risk Factors  Advanced age  ETOH use  Obesity, Body mass index is 30.48 kg/(m^2).   Other Active Problems  Apparent left lower lobe lung mass measuring 3.8 x 3.0  cm - further evaluation recommended  Renal insufficiency - creatinine 1.5 - 1.24     Hospital day # 1  This patient is critically ill due to large left frontal ICH and at significant risk of neurological worsening, death form recurrent ICH, cerebral edema, brain herniation. This patient's care requires constant monitoring of vital signs, hemodynamics, respiratory and cardiac monitoring, review of multiple databases, neurological assessment, discussion with family, other specialists and medical decision making of high complexity. I spent 35 minutes of neurocritical care time in the care of this patient.  Rosalin Hawking, MD PhD Stroke Neurology 08/23/2015 4:10 PM    To contact Stroke Continuity provider, please refer to [email or web address] After hours, contact General Neurology

## 2015-08-23 NOTE — Progress Notes (Signed)
  Echocardiogram 2D Echocardiogram has been performed.  David [name] 08/23/2015, 2:26 PM

## 2015-08-24 DIAGNOSIS — G936 Cerebral edema: Secondary | ICD-10-CM

## 2015-08-24 DIAGNOSIS — N182 Chronic kidney disease, stage 2 (mild): Secondary | ICD-10-CM

## 2015-08-24 LAB — LIPID PANEL
CHOL/HDL RATIO: 5.2 ratio
CHOLESTEROL: 215 mg/dL — AB (ref 0–200)
HDL: 41 mg/dL (ref >40)
LDL Cholesterol: 135 mg/dL — ABNORMAL HIGH (ref 0–99)
Triglycerides: 196 mg/dL — ABNORMAL HIGH (ref <150)
VLDL: 39 mg/dL (ref 0–40)

## 2015-08-24 LAB — CBC
HEMATOCRIT: 43.4 % (ref 39.0–52.0)
HEMOGLOBIN: 14.4 g/dL (ref 13.0–17.0)
MCH: 31.4 pg (ref 26.0–34.0)
MCHC: 33.2 g/dL (ref 30.0–36.0)
MCV: 94.6 fL (ref 78.0–100.0)
Platelets: 142 10*3/uL — ABNORMAL LOW (ref 150–400)
RBC: 4.59 MIL/uL (ref 4.22–5.81)
RDW: 15.3 % (ref 11.5–15.5)
WBC: 5.2 10*3/uL (ref 4.0–10.5)

## 2015-08-24 LAB — BASIC METABOLIC PANEL
Anion gap: 12 (ref 5–15)
BUN: 21 mg/dL — ABNORMAL HIGH (ref 6–20)
CALCIUM: 8.9 mg/dL (ref 8.9–10.3)
CHLORIDE: 103 mmol/L (ref 101–111)
CO2: 23 mmol/L (ref 22–32)
CREATININE: 1.27 mg/dL — AB (ref 0.61–1.24)
GFR calc non Af Amer: 53 mL/min — ABNORMAL LOW (ref >60)
GLUCOSE: 94 mg/dL (ref 65–99)
Potassium: 4.1 mmol/L (ref 3.5–5.1)
Sodium: 138 mmol/L (ref 135–145)

## 2015-08-24 LAB — HEMOGLOBIN A1C
Hgb A1c MFr Bld: 6.1 % — ABNORMAL HIGH (ref 4.8–5.6)
MEAN PLASMA GLUCOSE: 128 mg/dL

## 2015-08-24 MED ORDER — PANTOPRAZOLE SODIUM 40 MG PO TBEC
40.0000 mg | DELAYED_RELEASE_TABLET | Freq: Every day | ORAL | Status: DC
Start: 2015-08-24 — End: 2015-08-26
  Administered 2015-08-24 – 2015-08-26 (×3): 40 mg via ORAL
  Filled 2015-08-24 (×3): qty 1

## 2015-08-24 MED ORDER — ADULT MULTIVITAMIN W/MINERALS CH
1.0000 | ORAL_TABLET | Freq: Every day | ORAL | Status: DC
  Administered 2015-08-24 – 2015-08-26 (×3): 1 via ORAL
  Filled 2015-08-24 (×3): qty 1

## 2015-08-24 MED ORDER — LEVOTHYROXINE SODIUM 25 MCG PO TABS
125.0000 ug | ORAL_TABLET | Freq: Every day | ORAL | Status: DC
  Administered 2015-08-25 – 2015-08-26 (×2): 125 ug via ORAL
  Filled 2015-08-24 (×2): qty 1

## 2015-08-24 MED ORDER — SERTRALINE HCL 100 MG PO TABS
100.0000 mg | ORAL_TABLET | Freq: Every day | ORAL | Status: DC
  Administered 2015-08-24 – 2015-08-26 (×3): 100 mg via ORAL
  Filled 2015-08-24 (×2): qty 1
  Filled 2015-08-24: qty 2

## 2015-08-24 MED ORDER — ROSUVASTATIN CALCIUM 10 MG PO TABS
10.0000 mg | ORAL_TABLET | Freq: Every day | ORAL | Status: DC
  Administered 2015-08-24 – 2015-08-25 (×2): 10 mg via ORAL
  Filled 2015-08-24 (×4): qty 1

## 2015-08-24 MED ORDER — LABETALOL HCL 5 MG/ML IV SOLN: 10.0000 mg | INTRAVENOUS | Status: DC | PRN

## 2015-08-24 NOTE — Evaluation (Signed)
Speech Language Pathology Evaluation Patient Details Name: David [name] MRN: [id number] DOB: 05/20/1940 Today's Date: 08/24/2015 Time: 9833-8250 SLP Time Calculation (min) (ACUTE ONLY): 26 min  Problem List:  Patient Active Problem List   Diagnosis Date Noted  . ICH (intracerebral hemorrhage) ([place]) 08/22/2015   Past Medical History:  Past Medical History  Diagnosis Date  . Hypertension   . Sleep apnea   . High cholesterol   . Renal disorder   . Pacemaker    Past Surgical History:  Past Surgical History  Procedure Laterality Date  . Cardiac surgery     HPI:  75 y.o. male history of hypertension, hypercholesterolemia, sleep apnea, renal disorder and pacemaker brought to the emergency room with new onset speech output difficulty (word finding and paraphasic errors) and right lower facial weakness. CT scan showed a 5.7 x 4.5 cm left frontal intraparenchymal hemorrhage. 6 mm left-to-right midline shift was also noted.    Assessment / Plan / Recommendation Clinical Impression  Pt demonstrated global aphasia and apraxia of speech during assessment with bedside WAB however receptive language > expressive. Pt aware of majority of expressive errors/difficulty (dysfluency, phonemic paraphasias) although stimulable with phonemic cues approximately 30% of the time. Dysnomia with common objects, biographical and environmental y/n questions 70%, 4/10 on sequential commands, dysgraphia. Continued ST to maximize communicative independence.      SLP Assessment  Patient needs continued Speech Lanaguage Pathology Services    Follow Up Recommendations  Inpatient Rehab    Frequency and Duration min 2x/week  2 weeks      SLP Evaluation Prior Functioning  Cognitive/Linguistic Baseline: Within functional limits   Cognition  Overall Cognitive Status: Impaired/Different from baseline Arousal/Alertness: Awake/alert Orientation Level: Oriented to person;Oriented to place;Oriented to  time Attention: Sustained Sustained Attention: Appears intact Memory:  (appeared intact during assessment, will continue to evaluate) Awareness: Impaired Awareness Impairment: Anticipatory impairment Problem Solving:  (will continue to assess) Safety/Judgment:  (will continue to assess)    Comprehension  Auditory Comprehension Overall Auditory Comprehension: Impaired Yes/No Questions: Impaired Basic Biographical Questions: 51-75% accurate Basic Immediate Environment Questions: 50-74% accurate Commands: Impaired Two Step Basic Commands: 0-24% accurate Multistep Basic Commands: 0-24% accurate Visual Recognition/Discrimination Discrimination: Not tested Reading Comprehension Reading Status:  (TBA)    Expression Expression Primary Mode of Expression: Verbal Verbal Expression Overall Verbal Expression: Impaired Initiation: Impaired Level of Generative/Spontaneous Verbalization: Phrase Repetition: Impaired Level of Impairment: Phrase level;Sentence level (4/6) Naming: Impairment Responsive: Not tested Confrontation: Impaired (20%) Convergent: Not tested Verbal Errors: Aware of errors;Phonemic paraphasias Pragmatics: No impairment Written Expression Dominant Hand: Right Written Expression: Exceptions to [facility] Self Formulation Ability: Word   Oral / Motor  Oral Motor/Sensory Function Overall Oral Motor/Sensory Function: Mild impairment Facial ROM: Reduced right Lingual ROM: Within Functional Limits Motor Speech Overall Motor Speech: Impaired Respiration: Within functional limits Phonation: Normal Resonance: Within functional limits Articulation: Impaired Level of Impairment: Phrase Intelligibility: Intelligible Motor Planning: Impaired Level of Impairment: Phrase Motor Speech Errors: Aware   GO                    Houston Siren 08/24/2015, 11:50 AM  Orbie Pyo Colvin Caroli.Ed [employer] [phone]

## 2015-08-24 NOTE — Clinical Documentation Improvement (Signed)
  Neurology  Can the diagnosis of Renal Insufficiency be further specified? Please document response in next progress note. Thank you.   Acute Renal Failure/Acute Kidney Injury  Acute on Chronic Renal Failure - please identify stage if known  Chronic Renal Failure - please identify stage if known (see GFR's below)  Other Condition  Clinically Undetermined  Document any associated diagnoses/conditions.  Supporting Information:  Creatinine's have improved from 1.59 on admit to 1.27 this morning  This is a 0.32 drop in a 48 hour time frame  GFR's for current admission are running from 41 to 55  Please exercise your independent, professional judgment when responding. A specific answer is not anticipated or expected.  Thank You,  Zoila Shutter RN, BSN, [place] [phone]; Cell: [phone]

## 2015-08-24 NOTE — Progress Notes (Signed)
PT Cancellation Note  Patient Details Name: David [name] MRN: [id number] DOB: Jul 28, 1940   Cancelled Treatment:    Reason Eval/Treat Not Completed: Patient not medically ready, patient with active bedrest orders at this time.   David [name] 08/24/2015, 7:29 AM Alben Deeds, PT DPT  [phone]

## 2015-08-24 NOTE — Evaluation (Signed)
Physical Therapy Evaluation Patient Details Name: David [name] MRN: [id number] DOB: 12/23/1940 Today's Date: 08/24/2015   History of Present Illness  75 y.o. male history of hypertension, hypercholesterolemia, sleep apnea, renal disorder and pacemaker brought to the emergency room with new onset speech output difficulty (word finding and paraphasic errors) and right lower facial weakness. CT scan showed a 5.7 x 4.5 cm left frontal intraparenchymal hemorrhage. 6 mm left-to-right midline shift was also noted.    Clinical Impression  Patient demonstrates deficits in functional mobility as indicated below. Will need continued skilled PT to address deficits and maximize function. Will see as indicated and progress as tolerated. Patient with cognitive deficits and expressive deficits impacting functional tasks. Feel patient would benefit from comprehensive therapies upon acute discharge to address impairments and facilitate recovery.    Follow Up Recommendations CIR;Supervision/Assistance - 24 hour    Equipment Recommendations  None recommended by PT    Recommendations for Other Services       Precautions / Restrictions Precautions Precautions: Fall      Mobility  Bed Mobility Overal bed mobility: Needs Assistance Bed Mobility: Supine to Sit     Supine to sit: Min assist     General bed mobility comments: when coming to EOB, patient could not problem solve how to get LE in position around bed rail, assist to complete task and come to EOB.  Transfers Overall transfer level: Needs assistance Equipment used: 1 person hand held assist Transfers: Sit to/from Stand Sit to Stand: Min assist         General transfer comment: min assist for stability  Ambulation/Gait Ambulation/Gait assistance: Min assist Ambulation Distance (Feet): 80 Feet Assistive device: 1 person hand held assist Gait Pattern/deviations: Decreased stride length;Drifts right/left;Narrow base of support Gait  velocity: decreased   General Gait Details: patient very rigid and guarded with gait  Stairs            Wheelchair Mobility    Modified Rankin (Stroke Patients Only)       Balance Overall balance assessment: Needs assistance   Sitting balance-Leahy Scale: Fair       Standing balance-Leahy Scale: Fair Standing balance comment: min guard for static standing while holding on to wall and objects to stablize                             Pertinent Vitals/Pain Pain Assessment: No/denies pain    Home Living Family/patient expects to be discharged to:: Private residence Living Arrangements: Spouse/significant other;Children Available Help at Discharge: Family Type of Home: House Home Access: Stairs to enter Entrance Stairs-Rails: None Technical brewer of Steps: 3 Home Layout: Able to live on main level with bedroom/bathroom Home Equipment: None      Prior Function Level of Independence: Independent               Hand Dominance   Dominant Hand: Right    Extremity/Trunk Assessment               Lower Extremity Assessment:  (strength WFL, some coordination deficits)         Communication   Communication: Expressive difficulties  Cognition Arousal/Alertness: Awake/alert Behavior During Therapy: Flat affect Overall Cognitive Status: Impaired/Different from baseline Area of Impairment: Following commands;Awareness;Problem solving       Following Commands: Follows one step commands with increased time   Awareness: Intellectual Problem Solving: Slow processing;Decreased initiation;Difficulty sequencing;Requires verbal cues;Requires tactile cues General Comments: patient with  intermittent difficulty problem sovling through tasks such as getting his LE around the side rail to sit at EOB    General Comments General comments (skin integrity, edema, etc.): patient incontinent of bowl and bladder during session, assisted patient with  bathing, hygiene, and pericare.    Exercises        Assessment/Plan    PT Assessment Patient needs continued PT services  PT Diagnosis Difficulty walking;Abnormality of gait;Altered mental status   PT Problem List Decreased activity tolerance;Decreased balance;Decreased mobility;Decreased coordination;Decreased safety awareness  PT Treatment Interventions DME instruction;Gait training;Stair training;Functional mobility training;Therapeutic activities;Therapeutic exercise;Balance training;Cognitive remediation;Patient/family education   PT Goals (Current goals can be found in the Care Plan section) Acute Rehab PT Goals Patient Stated Goal: to get better (per wife) PT Goal Formulation: With patient/family Time For Goal Achievement: 09/07/15 Potential to Achieve Goals: Good    Frequency Min 3X/week   Barriers to discharge        Co-evaluation               End of Session Equipment Utilized During Treatment: Gait belt Activity Tolerance: Patient limited by fatigue Patient left: in bed;with call bell/phone within reach;with family/visitor present Nurse Communication: Mobility status         Time: 3532-9924 PT Time Calculation (min) (ACUTE ONLY): 34 min   Charges:   PT Evaluation $PT Eval Moderate Complexity: 1 Procedure PT Treatments $Therapeutic Activity: 8-22 mins   PT G CodesDuncan Dull 09/18/2015, 4:36 PM Alben Deeds, [place] DPT  [phone]

## 2015-08-24 NOTE — Progress Notes (Signed)
STROKE TEAM PROGRESS NOTE   SUBJECTIVE (INTERVAL HISTORY) His daughter and wife are at the bedside.  He seems more awake alert than yesterday. Repeat CT yesterday showed stable hematoma and swelling. No AVM or aneurysm. He still has partial expressive aphasia but otherwise intact. No HA. Passed swallow on diet. Cre better but still has high BP and put back on po meds.   OBJECTIVE Temp:  [97.5 F (36.4 C)-98.9 F (37.2 C)] 97.5 F (36.4 C) (05/16 0800) Pulse Rate:  [74-83] 75 (05/16 1000) Cardiac Rhythm:  [-] Atrial paced (05/16 0800) Resp:  [12-25] 21 (05/16 1000) BP: (123-162)/(83-127) 151/97 mmHg (05/16 0900) SpO2:  [90 %-98 %] 91 % (05/16 1000)  CBC:  Recent Labs Lab 08/22/15 2024  08/23/15 1114 08/24/15 0336  WBC 5.8  --  4.8 5.2  NEUTROABS 4.4  --   --   --   HGB 16.4  < > 14.9 14.4  HCT 47.1  < > 44.1 43.4  MCV 94.4  --  94.0 94.6  PLT 159  --  140* 142*  < > = values in this interval not displayed.  Basic Metabolic Panel:   Recent Labs Lab 08/23/15 1114 08/24/15 0336  NA 138 138  K 4.6 4.1  CL 104 103  CO2 24 23  GLUCOSE 108* 94  BUN 24* 21*  CREATININE 1.24 1.27*  CALCIUM 9.2 8.9    Lipid Panel:     Component Value Date/Time   CHOL 215* 08/24/2015 0336   TRIG 196* 08/24/2015 0336   HDL 41 08/24/2015 0336   CHOLHDL 5.2 08/24/2015 0336   VLDL 39 08/24/2015 0336   LDLCALC 135* 08/24/2015 0336   HgbA1c:  Lab Results  Component Value Date   HGBA1C 6.1* 08/23/2015   Urine Drug Screen: No results found for: LABOPIA, COCAINSCRNUR, LABBENZ, AMPHETMU, THCU, LABBARB    IMAGING I have personally reviewed the radiological images below and agree with the radiology interpretations.  Ct Head Wo Contrast 08/22/2015   Large left frontal lobe intraparenchymal acute hemorrhage with associated mass effect and approximately 6 mm left-to-right midline shift.   Chest Port 1 View 08/23/2015   Apparent left lower lobe mass measuring 3.8 x 3.0 cm. Advise  correlation with chest CT, ideally with intravenous contrast, to further evaluate. Lungs elsewhere clear. Heart mildly enlarged with pacemaker leads as described. Patient is also status post coronary artery bypass grafting.   Ct Angio Head W/cm &/or Wo Cm  08/23/2015  IMPRESSION: Large left frontal hematoma unchanged from yesterday. No underlying vascular malformation. Consider amyloid and hypertension as causes of the hemorrhage.  CUS - There is no obvious evidence of hemodynamically significant internal carotid artery stenosis bilaterally. Vertebral arteries are patent with antegrade flow.  TTE - - Procedure narrative: Transthoracic echocardiography. Image  quality was poor. The study was technically difficult, as a  result of poor sound wave transmission. - Left ventricle: The cavity size was normal. There was moderate  concentric hypertrophy. Systolic function was vigorous. The  estimated ejection fraction was in the range of 65% to 70%. Wall  motion was normal; there were no regional wall motion  abnormalities. Doppler parameters are consistent with abnormal  left ventricular relaxation (grade 1 diastolic dysfunction).  There was no evidence of elevated ventricular filling pressure by  Doppler parameters. - Aortic valve: There was no regurgitation. - Aortic root: The aortic root was normal in size. - Ascending aorta: The ascending aorta was normal in size. - Mitral valve: There was  no regurgitation. - Left atrium: The atrium was normal in size. - Right ventricle: The cavity size was normal. Wall thickness was  normal. Systolic function was normal. - Right atrium: The atrium was normal in size. - Tricuspid valve: There was mild regurgitation. - Pulmonic valve: There was no regurgitation. - Pulmonary arteries: Systolic pressure was within the normal  range. - Inferior vena cava: The vessel was normal in size. - Pericardium, extracardiac: There was no pericardial  effusion.   PHYSICAL EXAM  Temp:  [97.5 F (36.4 C)-98.9 F (37.2 C)] 97.5 F (36.4 C) (05/16 0800) Pulse Rate:  [74-83] 75 (05/16 1000) Resp:  [12-25] 21 (05/16 1000) BP: (123-162)/(83-127) 151/97 mmHg (05/16 0900) SpO2:  [90 %-98 %] 91 % (05/16 1000)  General - Well nourished, well developed, in no apparent distress.  Ophthalmologic - Fundi not visualized due to noncooperation.  Cardiovascular - Regular rate and rhythm with no murmur.  Mental Status -  Level of arousal and orientation to time, place, and person were intact. Language exam showed partial expressive aphasia, difficulty with naming and complex sentence repeating. Agraphia. Intact calculation. Not able to do finger naming but intact left right orientation. Able to read. Did not test writing. Intact on following commands.   Cranial Nerves II - XII - II - Visual field intact OU. III, IV, VI - Extraocular movements intact. V - Facial sensation intact bilaterally. VII - Facial movement intact bilaterally. VIII - Hearing & vestibular intact bilaterally. X - Palate elevates symmetrically. XI - Chin turning & shoulder shrug intact bilaterally. XII - Tongue protrusion intact.  Motor Strength - The patient's strength was normal in all extremities and pronator drift was absent.  Bulk was normal and fasciculations were absent.   Motor Tone - Muscle tone was assessed at the neck and appendages and was normal.  Reflexes - The patient's reflexes were 1+ in all extremities and he had no pathological reflexes.  Sensory - Light touch, temperature/pinprick were assessed and were symmetrical.    Coordination - The patient had normal movements in the hands with no ataxia or dysmetria.  Tremor was absent.  Gait and Station - deferred due to safety concerns.   ASSESSMENT/PLAN David [name] is a 75 y.o. male with history of hypertension, OSA, hyperlipidemia, CKD, and s/p permanent pacemaker presenting with speech  difficulties and elevated blood pressure. He did not receive IV t-PA due to intraparenchymal hemorrhage.  Stroke:  Left frontal ICH likely secondary to hypertension vs. CAA.  Resultant  Partial expressive aphasia, agraphia  CT head - left frontal large ICH  CTA of head - no underlying vascular pathology  Carotid Doppler - no significant stenosis  2D Echo EF 65-70%  LDL - 135  HgbA1c 6.1  VTE prophylaxis - SCDs Diet Heart Room service appropriate?: Yes; Fluid consistency:: Thin  No antithrombotic prior to admission, now on No antithrombotic secondary to hemorrhage.  Ongoing aggressive stroke risk factor management  Therapy recommendations: Pending  Disposition: Pending  Acute on chronic CKD  Cre 1.5 on admission, likely due to dehydration  Cre 1.24-1.27 after hydration  Encourage po intake  D/c IVF today  Put back on lisinopril  Hypertension  Blood pressure mildly high  BP goal < 160  cardene PRN  Put back on coreg and lisinopril home meds  Hyperlipidemia  Home meds:  Crestor 10 mg daily currently on hold  LDL 135, goal < 70  Resume crestor 10  Continue statin at discharge  Other Stroke Risk  Factors  Advanced age  ETOH use  Obesity, Body mass index is 30.48 kg/(m^2).   Other Active Problems  Apparent left lower lobe lung mass measuring 3.8 x 3.0 cm - further evaluation recommended - consider CT chest with contrast after Cre stabilized   Hospital day # 2  This patient is critically ill due to large left frontal ICH and at significant risk of neurological worsening, death form recurrent ICH, cerebral edema, brain herniation. This patient's care requires constant monitoring of vital signs, hemodynamics, respiratory and cardiac monitoring, review of multiple databases, neurological assessment, discussion with family, other specialists and medical decision making of high complexity. I spent 35 minutes of neurocritical care time in the care of this  patient.  Rosalin Hawking, MD PhD Stroke Neurology 08/24/2015 10:05 AM    To contact Stroke Continuity provider, please refer to [email or web address] After hours, contact General Neurology

## 2015-08-24 NOTE — Progress Notes (Signed)
OT Cancellation Note  Patient Details Name: David [name] MRN: [id number] DOB: [birth year]   Cancelled Treatment:    Reason Eval/Treat Not Completed: Patient not medically ready;Other (comment) (bedrest) OT order received and appreciated however this conflicts with current bedrest order set. Please increase activity tolerance as appropriate and remove bedrest from orders.OT will hold evaluation at this time and will check back as time allows pending increased activity orders.  Carden drip started and CT scan showed a 5.7 x 4.5 cm left frontal intraparenchymal hemorrhage. 6 mm left-to-right midline shift   Vonita Moss   OTR/L Pager: [phone] Office: [phone] .  08/24/2015, 7:00 AM

## 2015-08-24 NOTE — Progress Notes (Signed)
Rehab Admissions Coordinator Note:  Patient was screened by Cleatrice Burke for appropriateness for an Inpatient Acute Rehab Consult per PT recommendation.  At this time, we are recommending Inpatient Rehab consult. Please place order.  Cleatrice Burke 08/24/2015, 4:49 PM  I can be reached at [phone].

## 2015-08-25 ENCOUNTER — Encounter (HOSPITAL_COMMUNITY): Payer: Self-pay | Admitting: Physical Medicine and Rehabilitation

## 2015-08-25 DIAGNOSIS — Z95810 Presence of automatic (implantable) cardiac defibrillator: Secondary | ICD-10-CM | POA: Insufficient documentation

## 2015-08-25 DIAGNOSIS — Z950 Presence of cardiac pacemaker: Secondary | ICD-10-CM

## 2015-08-25 DIAGNOSIS — Z9989 Dependence on other enabling machines and devices: Secondary | ICD-10-CM

## 2015-08-25 DIAGNOSIS — N183 Chronic kidney disease, stage 3 (moderate): Secondary | ICD-10-CM

## 2015-08-25 DIAGNOSIS — I611 Nontraumatic intracerebral hemorrhage in hemisphere, cortical: Secondary | ICD-10-CM

## 2015-08-25 DIAGNOSIS — G4733 Obstructive sleep apnea (adult) (pediatric): Secondary | ICD-10-CM

## 2015-08-25 DIAGNOSIS — I10 Essential (primary) hypertension: Secondary | ICD-10-CM

## 2015-08-25 LAB — CBC
HCT: 43.1 % (ref 39.0–52.0)
Hemoglobin: 14.8 g/dL (ref 13.0–17.0)
MCH: 32.5 pg (ref 26.0–34.0)
MCHC: 34.3 g/dL (ref 30.0–36.0)
MCV: 94.5 fL (ref 78.0–100.0)
PLATELETS: 162 10*3/uL (ref 150–400)
RBC: 4.56 MIL/uL (ref 4.22–5.81)
RDW: 15.3 % (ref 11.5–15.5)
WBC: 7.7 10*3/uL (ref 4.0–10.5)

## 2015-08-25 LAB — BASIC METABOLIC PANEL
Anion gap: 13 (ref 5–15)
BUN: 22 mg/dL — AB (ref 6–20)
CO2: 23 mmol/L (ref 22–32)
Calcium: 9 mg/dL (ref 8.9–10.3)
Chloride: 102 mmol/L (ref 101–111)
Creatinine, Ser: 1.36 mg/dL — ABNORMAL HIGH (ref 0.61–1.24)
GFR calc Af Amer: 57 mL/min — ABNORMAL LOW (ref >60)
GFR, EST NON AFRICAN AMERICAN: 49 mL/min — AB (ref >60)
GLUCOSE: 102 mg/dL — AB (ref 65–99)
Potassium: 4.2 mmol/L (ref 3.5–5.1)
Sodium: 138 mmol/L (ref 135–145)

## 2015-08-25 MED ORDER — CARVEDILOL 6.25 MG PO TABS
6.2500 mg | ORAL_TABLET | Freq: Two times a day (BID) | ORAL | Status: DC
  Administered 2015-08-25 – 2015-08-26 (×2): 6.25 mg via ORAL
  Filled 2015-08-25 (×2): qty 1

## 2015-08-25 NOTE — Progress Notes (Addendum)
Occupational Therapy Evaluation Patient Details Name: David [name] MRN: [id number] DOB: 10/24/1940 Today's Date: 08/25/2015    History of Present Illness 75 y.o. male history of hypertension, hypercholesterolemia, sleep apnea, renal disorder and pacemaker brought to the emergency room with new onset speech output difficulty (word finding and paraphasic errors) and right lower facial weakness. CT scan showed a 5.7 x 4.5 cm left frontal intraparenchymal hemorrhage. 6 mm left-to-right midline shift was also noted.     Clinical Impression   PTA, pt was independent with ADLs and mobility as reported by his wife. Pt currently presents with cognitive deficits including functional communication, motor planning, delayed processing, attention, and awareness deficits impacting his ability to perform daily tasks. Pt was unaware of BM incontinence on OT arrival required min-max assist with bathing and dressing tasks and mod assist for functional transfers. Overall, pt responded better to gestural cues rather than verbal cues. Pt will benefit from continued acute OT to increase independence and safety with ADLs and functional mobility to allow for safe discharge to the venue listed below. Recommend CIR for post-acute stay as I feel pt could make significant gains towards independence. Will continue to follow acutely.    Follow Up Recommendations  CIR    Equipment Recommendations  Other (comment) (TBD in next venue)    Recommendations for Other Services       Precautions / Restrictions Precautions Precautions: Fall Restrictions Weight Bearing Restrictions: No      Mobility Bed Mobility Overal bed mobility: Needs Assistance Bed Mobility: Supine to Sit     Supine to sit: Min guard     General bed mobility comments: Pt able to sit EOB with 1 verbal and gestural cue. Required increased time and effort.  Transfers Overall transfer level: Needs assistance Equipment used: 2 person hand held  assist;Rolling walker (2 wheeled) Transfers: Sit to/from Stand Sit to Stand: Mod assist         General transfer comment: Min-mod assist for boost to stand and balance upon standing. Initially used 1-2 person hand held assist, but pt reaching for wall outside BoS, pt performed better with RW and min assist on R side. Pt demonstrated weakness on R side and was unable to clear R foot from floor while ambulating. Gestural/tactile cues for safe hand placement while using RW and cues to bring awareness to R side drifting.      Balance Overall balance assessment: Needs assistance Sitting-balance support: No upper extremity supported;Feet supported Sitting balance-Leahy Scale: Good     Standing balance support: Bilateral upper extremity supported;During functional activity Standing balance-Leahy Scale: Poor Standing balance comment: Unable to maintain balance without UE support from table, wall, therapist or RW                            ADL Overall ADL's : Needs assistance/impaired             Lower Body Bathing: Maximal assistance;Cueing for sequencing;Sit to/from stand Lower Body Bathing Details (indicate cue type and reason): Assist for sit-stand and to thoroughly complete washing. With 2 verbal cues, pt unable to get wash cloth, but pt responded better with 1 gestural cue.   Upper Body Dressing : Minimal assistance;Cueing for sequencing;Sitting Upper Body Dressing Details (indicate cue type and reason): Pt able to remove gown when asked and put arms through clean gown sleeves with 1 verbal cues. However, when asked to "Put this gown on like a robe", pt attempted but  was unable to complete task as asked. Lower Body Dressing: Minimal assistance;Sit to/from stand Lower Body Dressing Details (indicate cue type and reason): Handed pt 1 sock at a time and pt automatically put them on and did not require verbal cues. Assist to fully don socks as pt visibly fatigued, but was unable  to ask for help until therapist prompted. Toilet Transfer: Moderate assistance;Cueing for sequencing;Ambulation;BSC;RW Toilet Transfer Details (indicate cue type and reason): Assist for sit-stand, better response to gestural cues. Toileting- Clothing Manipulation and Hygiene: Maximal assistance;Cueing for sequencing;Sit to/from stand Toileting - Clothing Manipulation Details (indicate cue type and reason): Pt unaware of BM incontinence in bed on OT arrival. Assist for sit-stand, pericare and dressing tasks.     Functional mobility during ADLs: Minimal assistance;Rolling walker;Cueing for safety;Cueing for sequencing General ADL Comments: Pt responds better to gestural cues and requires increased time and effort. Pt also responds better when given 2 choices due to functional communication deficits.     Vision Additional Comments: Unsure if pt experiencing visual deficits. Difficult to assess due to impaired cognition. Appears to be deficits related more to attention and awareness.   Perception     Praxis      Pertinent Vitals/Pain Pain Assessment: No/denies pain     Hand Dominance Right   Extremity/Trunk Assessment         Cervical / Trunk Assessment Cervical / Trunk Assessment: Other exceptions Cervical / Trunk Exceptions: forward head, rounded shoulders   Communication Communication Communication: Expressive difficulties   Cognition Arousal/Alertness: Awake/alert Behavior During Therapy: Flat affect Overall Cognitive Status: Impaired/Different from baseline Area of Impairment: Attention;Memory;Following commands;Safety/judgement;Awareness;Problem solving   Current Attention Level: Sustained Memory: Decreased short-term memory Following Commands: Follows one step commands inconsistently;Follows one step commands with increased time Safety/Judgement: Decreased awareness of safety;Decreased awareness of deficits Awareness: Intellectual Problem Solving: Slow  processing;Decreased initiation;Difficulty sequencing;Requires verbal cues;Requires tactile cues General Comments: Pt has motor planning, functional communication, and attention deficits impacting his ability to perform daily self-care tasks. Pt appears to have difficulty initiating and sequencing tasks and responds better with gestural cues than verbal cues. Unsure if pt demonstrates decreased attention or visual perceptual deficits as pt ran therapist into laundry cart on R side and required cues to avoid doing this. When provided simple choices (2), pt was able to verbalize that he wanted water, but was unable to respond to open ended question "What would you like to drink."    General Comments       Exercises       Shoulder Instructions      Home Living Family/patient expects to be discharged to:: Private residence Living Arrangements: Spouse/significant other;Children Available Help at Discharge: Family Type of Home: House Home Access: Stairs to enter Technical brewer of Steps: 3 Entrance Stairs-Rails: None Home Layout: Able to live on main level with bedroom/bathroom         Bathroom Toilet: Standard     Home Equipment: None          Prior Functioning/Environment Level of Independence: Independent             OT Diagnosis: Cognitive deficits;Apraxia;Hemiplegia non-dominant side   OT Problem List: Decreased activity tolerance;Impaired balance (sitting and/or standing);Decreased coordination;Decreased cognition;Decreased safety awareness;Decreased knowledge of use of DME or AE   OT Treatment/Interventions: Self-care/ADL training;Therapeutic exercise;Energy conservation;DME and/or AE instruction;Therapeutic activities;Cognitive remediation/compensation;Patient/family education;Balance training    OT Goals(Current goals can be found in the care plan section) Acute Rehab OT Goals Patient Stated Goal: to get  better (per wife) OT Goal Formulation: With  family Time For Goal Achievement: 09/08/15 Potential to Achieve Goals: Good ADL Goals Pt Will Perform Grooming: with set-up;sitting (with 1 gestural cue) Pt Will Perform Upper Body Dressing: with set-up;sitting (with 1 gestural cue) Pt Will Perform Lower Body Dressing: with min guard assist;sit to/from stand (with 1 gestural cue) Pt Will Transfer to Toilet: with supervision;ambulating;bedside commode (with 1 gestural cue) Pt Will Perform Toileting - Clothing Manipulation and hygiene: with supervision;sitting/lateral leans;sit to/from stand (with 1 gestural cue) Additional ADL Goal #1: Pt will initiate a self-care task when provided with 1 gestural cue.  OT Frequency: Min 3X/week   Barriers to D/C: Decreased caregiver support  Pt lives with wife who can only provide supervision assistance       Co-evaluation              End of Session Equipment Utilized During Treatment: Gait belt;Rolling walker Nurse Communication: Mobility status;Other (comment) (Pt responds better to gestural cues than verbal cues)  Activity Tolerance: Patient tolerated treatment well Patient left: in chair;with call bell/phone within reach;with chair alarm set;with family/visitor present   Time: 2993-7169 OT Time Calculation (min): 38 min Charges:  OT General Charges $OT Visit: 1 Procedure OT Evaluation $OT Eval High Complexity: 1 Procedure OT Treatments $Self Care/Home Management : 23-37 mins G-Codes:    Redmond Baseman, OTR/L Pager: [phone] 08/25/2015, 4:45 PM

## 2015-08-25 NOTE — Progress Notes (Signed)
Speech Language Pathology Treatment: Cognitive-Linquistic  Patient Details Name: David [name] MRN: [id number] DOB: [birth year] Today's Date: 08/25/2015 Time: 1694-5038 SLP Time Calculation (min) (ACUTE ONLY): 16 min  Assessment / Plan / Recommendation Clinical Impression: Pt was very tired having just finished walking and working with PT/OT. He fell asleep multiple times during session. He did not respond to semantic or phonemic cues well possibly due to lethargy. His wife said he is much more awake during the morning.      HPI        SLP Plan  Continue with current plan of care                  Plan: Continue with current plan of care     [place], MA, CCC-SLP 08/25/2015 4:01 PM

## 2015-08-25 NOTE — Care Management Important Message (Signed)
Important Message  Patient Details  Name: David [name] MRN: [id number] Date of Birth: 09-16-1940   Medicare Important Message Given:  Yes    Pollie Friar, RN 08/25/2015, 9:11 AM

## 2015-08-25 NOTE — Care Management Note (Signed)
Case Management Note  Patient Details  Name: David [name] MRN: [id number] Date of Birth: [birth year]  Subjective/Objective:       Pt admitted with ICH. He is from home with his wife.              Action/Plan: PT rec is for CIR. CM following for discharge disposition.   Expected Discharge Date:                  Expected Discharge Plan:  [place]  In-House Referral:     Discharge planning Services     Post Acute Care Choice:    Choice offered to:     DME Arranged:    DME Agency:     HH Arranged:    [place] Agency:     Status of Service:  In process, will continue to follow  Medicare Important Message Given:  Yes Date Medicare IM Given:    Medicare IM give by:    Date Additional Medicare IM Given:    Additional Medicare Important Message give by:     If discussed at [place] of Stay Meetings, dates discussed:    Additional Comments:  Pollie Friar, RN 08/25/2015, 10:28 AM

## 2015-08-25 NOTE — Consult Note (Signed)
Physical Medicine and Rehabilitation Consult   Reason for Consult: Right facial weakness and global aphasia.   Referring Physician: Dr. Erlinda Hong   HPI: David [name] is a 75 y.o. male with history of HTN, OSA, CKD, PPM who was admitted on 08/22/15 with expressive difficulty. He was found to have left frontal hemorrhage 5.7 cm x 4.5 cm with 6 mm left to right shift.  Blood pressure 176/111 at admission and he was started on Cardene drip. Carotid dopplers without significant ICA stenosis. TEE with EF 65-70% with grade one diastolic dysfunction. Follow up CTA head showed stable large left frontal hematoma without vascular malformations. Speech therapy evaluation done showing global aphasia with receptive > expressive deficits including dysfluency, paraphasias and dysnomia. PT evaluation showed cognitive deficits and processing affecting mobility and CIR recommended by rehab team.   Lives with wife at [facility].  Daughter is Dr. [name] at [facility] [employer] ([place]).   Review of Systems  Unable to perform ROS: language     Past Medical History  Diagnosis Date  . Hypertension   . Sleep apnea   . High cholesterol   . Renal disorder   . Pacemaker     Past Surgical History  Procedure Laterality Date  . Cardiac surgery    . Knee arthroscopy  12/2008  . Foot surgery  08/2010    History reviewed. No pertinent family history.    Social History:  Married. Lives with wife. He smoked briefly in college and was exposed to second hand smoke. Uses cigar occasionally.  He does not have any smokeless tobacco history on file. Per reports that he drinks alcohol--"too much"  per sister. Per  reports that he does not use illicit drugs.    Allergies: No Known Allergies    Medications Prior to Admission  Medication Sig Dispense Refill  . carvedilol (COREG) 3.125 MG tablet Take 3.125 mg by mouth 2 (two) times daily with a meal.    . levothyroxine (SYNTHROID, LEVOTHROID)  125 MCG tablet Take 125 mcg by mouth daily before breakfast.    . lisinopril (PRINIVIL,ZESTRIL) 5 MG tablet Take 5 mg by mouth 2 (two) times daily.    . Multiple Vitamin (MULTIVITAMIN WITH MINERALS) TABS tablet Take 1 tablet by mouth daily.    . naproxen sodium (ALEVE) 220 MG tablet Take 220 mg by mouth 2 (two) times daily as needed (pain).    Marland Kitchen PRESCRIPTION MEDICATION Inhale into the lungs at bedtime. CPAP    . rosuvastatin (CRESTOR) 10 MG tablet Take 10 mg by mouth at bedtime.    . sertraline (ZOLOFT) 100 MG tablet Take 100 mg by mouth daily.    . Turmeric Curcumin 500 MG CAPS Take 500 mg by mouth 2 (two) times daily.      Home: Home Living Family/patient expects to be discharged to:: Private residence Living Arrangements: Spouse/significant other, Children Available Help at Discharge: Family Type of Home: House Home Access: Stairs to enter Technical brewer of Steps: 3 Entrance Stairs-Rails: None Home Layout: Able to live on main level with bedroom/bathroom Bathroom Toilet: Standard Home Equipment: None  Functional History: Prior Function Level of Independence: Independent Functional Status:  Mobility: Bed Mobility Overal bed mobility: Needs Assistance Bed Mobility: Supine to Sit Supine to sit: Min assist General bed mobility comments: when coming to EOB, patient could not problem solve how to get LE in position around bed rail, assist to complete task and come to EOB. Transfers Overall transfer level: Needs  assistance Equipment used: 1 person hand held assist Transfers: Sit to/from Stand Sit to Stand: Min assist General transfer comment: min assist for stability Ambulation/Gait Ambulation/Gait assistance: Min assist Ambulation Distance (Feet): 80 Feet Assistive device: 1 person hand held assist Gait Pattern/deviations: Decreased stride length, Drifts right/left, Narrow base of support General Gait Details: patient very rigid and guarded with gait Gait velocity:  decreased    ADL:    Cognition: Cognition Overall Cognitive Status: Impaired/Different from baseline Arousal/Alertness: Awake/alert Orientation Level: Oriented to person, Oriented to place, Oriented to time (expressive aphasia ) Attention: Sustained Sustained Attention: Appears intact Memory:  (appeared intact during assessment, will continue to evaluate) Awareness: Impaired Awareness Impairment: Anticipatory impairment Problem Solving:  (will continue to assess) Safety/Judgment:  (will continue to assess) Cognition Arousal/Alertness: Awake/alert Behavior During Therapy: Flat affect Overall Cognitive Status: Impaired/Different from baseline Area of Impairment: Following commands, Awareness, Problem solving Following Commands: Follows one step commands with increased time Awareness: Intellectual Problem Solving: Slow processing, Decreased initiation, Difficulty sequencing, Requires verbal cues, Requires tactile cues General Comments: patient with intermittent difficulty problem sovling through tasks such as getting his LE around the side rail to sit at EOB Difficult to assess due to: Impaired communication   Blood pressure 122/72, pulse 92, temperature 97.9 F (36.6 C), temperature source Oral, resp. rate 16, height '5\' 10"'$  (1.778 m), weight 95.664 kg (210 lb 14.4 oz), SpO2 95 %. Physical Exam  Vitals reviewed. Constitutional: He appears well-developed and well-nourished.  HENT:  Head: Normocephalic and atraumatic.  Eyes: Conjunctivae and EOM are normal.  Neck: No JVD present. No tracheal deviation present.  Cardiovascular: Normal rate and regular rhythm.   Respiratory: Effort normal and breath sounds normal.  GI: Soft. Bowel sounds are normal.  Musculoskeletal: He exhibits no edema or tenderness.  Neurological: He is alert.  Rec >Exp aphasia DTRs symmetric Right facial weakness Inconsistently follows commands Unable to assess sensation Motor: ?Grossly 4+/5 throughout    Skin: Skin is warm and dry.  Psychiatric:  Unable to assess    Results for orders placed or performed during the hospital encounter of 08/22/15 (from the past 24 hour(s))  CBC     Status: None   Collection Time: 08/25/15  5:33 AM  Result Value Ref Range   WBC 7.7 4.0 - 10.5 K/uL   RBC 4.56 4.22 - 5.81 MIL/uL   Hemoglobin 14.8 13.0 - 17.0 g/dL   HCT 43.1 39.0 - 52.0 %   MCV 94.5 78.0 - 100.0 fL   MCH 32.5 26.0 - 34.0 pg   MCHC 34.3 30.0 - 36.0 g/dL   RDW 15.3 11.5 - 15.5 %   Platelets 162 150 - 400 K/uL  Basic metabolic panel     Status: Abnormal   Collection Time: 08/25/15  5:33 AM  Result Value Ref Range   Sodium 138 135 - 145 mmol/L   Potassium 4.2 3.5 - 5.1 mmol/L   Chloride 102 101 - 111 mmol/L   CO2 23 22 - 32 mmol/L   Glucose, Bld 102 (H) 65 - 99 mg/dL   BUN 22 (H) 6 - 20 mg/dL   Creatinine, Ser 1.36 (H) 0.61 - 1.24 mg/dL   Calcium 9.0 8.9 - 10.3 mg/dL   GFR calc non Af Amer 49 (L) >60 mL/min   GFR calc Af Amer 57 (L) >60 mL/min   Anion gap 13 5 - 15   Ct Angio Head W/cm &/or Wo Cm  08/23/2015  CLINICAL DATA:  Intracranial hemorrhage. EXAM: CT  ANGIOGRAPHY HEAD TECHNIQUE: Multidetector CT imaging of the head was performed using the standard protocol during bolus administration of intravenous contrast. Multiplanar CT image reconstructions and MIPs were obtained to evaluate the vascular anatomy. CONTRAST:  100 mL Omnipaque 300 IV COMPARISON:  CT head 08/22/2015 FINDINGS: CT HEAD Brain: Left frontal lobe hemorrhage is unchanged measuring 5.9 x 4.7 cm. There is surrounding edema which shows mild progression. No new area of hemorrhage. There is mass-effect on the left frontal lobe with mild midline shift to the right of 5 mm which appears unchanged. No ventricular hemorrhage. Negative for acute ischemic infarct. Calvarium and skull base: Negative Paranasal sinuses: Mild mucosal edema in the sphenoid sinus. Mastoid sinus is clear. Orbits: No orbital mass. Bilateral lens  replacement. Scleral buckle on the left. CTA HEAD Anterior circulation: Cavernous carotid widely patent bilaterally. No cavernous stenosis or aneurysm. Anterior and middle cerebral arteries are patent bilaterally. No stenosis or occluded segments. No aneurysm or vascular malformation to account for the left frontal hemorrhage. Posterior circulation: Both vertebral arteries patent to the basilar. Left vertebral dominant. PICA patent bilaterally. Basilar patent. Superior cerebellar and posterior cerebral arteries patent bilaterally without stenosis. Venous sinuses: Patent Anatomic variants: None Delayed phase:Normal enhancement on delayed imaging. No enhancing mass lesion identified. IMPRESSION: Large left frontal hematoma unchanged from yesterday. No underlying vascular malformation. Consider amyloid and hypertension as causes of the hemorrhage. Electronically Signed   By: Franchot Gallo M.D.   On: 08/23/2015 17:38    Assessment/Plan: Diagnosis: Left frontal hemorrhage Labs and images independently reviewed.  Records reviewed and summated above. Stroke: Continue secondary stroke prophylaxis and Risk Factor Modification listed below:   Blood Pressure Management:  Continue current medication with prn's with permisive HTN per primary   1. Does the need for close, 24 hr/day medical supervision in concert with the patient's rehab needs make it unreasonable for this patient to be served in a less intensive setting? Yes  2. Co-Morbidities requiring supervision/potential complications: HTN (monitor and provide prns in accordance with increased physical exertion and pain), OSA (encourage CPAP, monitor for daytime somnolence), CKD (avoid nephrotoxic meds), PPM  3. Due to bladder management, safety, disease management, medication administration and patient education, does the patient require 24 hr/day rehab nursing? Yes 4. Does the patient require coordinated care of a physician, rehab nurse, PT (1-2 hrs/day, 5  days/week), OT (1-2 hrs/day, 5 days/week) and SLP (1-2 hrs/day, 5 days/week) to address physical and functional deficits in the context of the above medical diagnosis(es)? Yes Addressing deficits in the following areas: balance, endurance, locomotion, strength, transferring, bathing, dressing, toileting, cognition, speech, language and psychosocial support 5. Can the patient actively participate in an intensive therapy program of at least 3 hrs of therapy per day at least 5 days per week? Yes 6. The potential for patient to make measurable gains while on inpatient rehab is excellent 7. Anticipated functional outcomes upon discharge from inpatient rehab are supervision  with PT, supervision and min assist with OT, min assist and mod assist with SLP. 8. Estimated rehab length of stay to reach the above functional goals is: 20-24 days. 9. Does the patient have adequate social supports and living environment to accommodate these discharge functional goals? Yes 10. Anticipated D/C setting: Home 11. Anticipated post D/C treatments: HH therapy and Home excercise program 12. Overall Rehab/Functional Prognosis: good  RECOMMENDATIONS: This patient's condition is appropriate for continued rehabilitative care in the following setting: CIR Patient has agreed to participate in recommended program. Potentially Note that  insurance prior authorization may be required for reimbursement for recommended care.  Comment: Rehab Admissions Coordinator to follow up.  Delice Lesch, MD 08/25/2015

## 2015-08-26 ENCOUNTER — Inpatient Hospital Stay (HOSPITAL_COMMUNITY)
Admission: RE | Admit: 2015-08-26 | Discharge: 2015-09-10 | DRG: 057 | Disposition: A | Discharge status: Skilled Nursing Facility | Payer: Medicare Other | Source: Intra-hospital | Attending: Physical Medicine & Rehabilitation | Admitting: Physical Medicine & Rehabilitation

## 2015-08-26 ENCOUNTER — Inpatient Hospital Stay (HOSPITAL_COMMUNITY): Payer: Medicare Other

## 2015-08-26 DIAGNOSIS — E7800 Pure hypercholesterolemia, unspecified: Secondary | ICD-10-CM | POA: Diagnosis present

## 2015-08-26 DIAGNOSIS — N183 Chronic kidney disease, stage 3 unspecified: Secondary | ICD-10-CM | POA: Insufficient documentation

## 2015-08-26 DIAGNOSIS — C799 Secondary malignant neoplasm of unspecified site: Secondary | ICD-10-CM | POA: Diagnosis not present

## 2015-08-26 DIAGNOSIS — R319 Hematuria, unspecified: Secondary | ICD-10-CM | POA: Diagnosis present

## 2015-08-26 DIAGNOSIS — F17290 Nicotine dependence, other tobacco product, uncomplicated: Secondary | ICD-10-CM | POA: Diagnosis present

## 2015-08-26 DIAGNOSIS — Z951 Presence of aortocoronary bypass graft: Secondary | ICD-10-CM

## 2015-08-26 DIAGNOSIS — I611 Nontraumatic intracerebral hemorrhage in hemisphere, cortical: Secondary | ICD-10-CM | POA: Diagnosis not present

## 2015-08-26 DIAGNOSIS — R32 Unspecified urinary incontinence: Secondary | ICD-10-CM

## 2015-08-26 DIAGNOSIS — N189 Chronic kidney disease, unspecified: Secondary | ICD-10-CM | POA: Diagnosis present

## 2015-08-26 DIAGNOSIS — E039 Hypothyroidism, unspecified: Secondary | ICD-10-CM | POA: Diagnosis present

## 2015-08-26 DIAGNOSIS — G4733 Obstructive sleep apnea (adult) (pediatric): Secondary | ICD-10-CM | POA: Diagnosis present

## 2015-08-26 DIAGNOSIS — I69151 Hemiplegia and hemiparesis following nontraumatic intracerebral hemorrhage affecting right dominant side: Principal | ICD-10-CM

## 2015-08-26 DIAGNOSIS — R338 Other retention of urine: Secondary | ICD-10-CM | POA: Diagnosis present

## 2015-08-26 DIAGNOSIS — I69120 Aphasia following nontraumatic intracerebral hemorrhage: Secondary | ICD-10-CM

## 2015-08-26 DIAGNOSIS — R269 Unspecified abnormalities of gait and mobility: Secondary | ICD-10-CM

## 2015-08-26 DIAGNOSIS — I69398 Other sequelae of cerebral infarction: Secondary | ICD-10-CM | POA: Diagnosis not present

## 2015-08-26 DIAGNOSIS — Z950 Presence of cardiac pacemaker: Secondary | ICD-10-CM

## 2015-08-26 DIAGNOSIS — I161 Hypertensive emergency: Secondary | ICD-10-CM

## 2015-08-26 DIAGNOSIS — Z79899 Other long term (current) drug therapy: Secondary | ICD-10-CM

## 2015-08-26 DIAGNOSIS — N179 Acute kidney failure, unspecified: Secondary | ICD-10-CM | POA: Diagnosis present

## 2015-08-26 DIAGNOSIS — R29810 Facial weakness: Secondary | ICD-10-CM | POA: Diagnosis present

## 2015-08-26 DIAGNOSIS — M25579 Pain in unspecified ankle and joints of unspecified foot: Secondary | ICD-10-CM | POA: Insufficient documentation

## 2015-08-26 DIAGNOSIS — I2510 Atherosclerotic heart disease of native coronary artery without angina pectoris: Secondary | ICD-10-CM | POA: Diagnosis present

## 2015-08-26 DIAGNOSIS — M25473 Effusion, unspecified ankle: Secondary | ICD-10-CM

## 2015-08-26 DIAGNOSIS — F329 Major depressive disorder, single episode, unspecified: Secondary | ICD-10-CM | POA: Diagnosis present

## 2015-08-26 DIAGNOSIS — N39498 Other specified urinary incontinence: Secondary | ICD-10-CM | POA: Diagnosis present

## 2015-08-26 DIAGNOSIS — R4701 Aphasia: Secondary | ICD-10-CM | POA: Diagnosis present

## 2015-08-26 DIAGNOSIS — E663 Overweight: Secondary | ICD-10-CM

## 2015-08-26 DIAGNOSIS — E785 Hyperlipidemia, unspecified: Secondary | ICD-10-CM

## 2015-08-26 DIAGNOSIS — N401 Enlarged prostate with lower urinary tract symptoms: Secondary | ICD-10-CM | POA: Diagnosis present

## 2015-08-26 DIAGNOSIS — I619 Nontraumatic intracerebral hemorrhage, unspecified: Secondary | ICD-10-CM | POA: Insufficient documentation

## 2015-08-26 DIAGNOSIS — R918 Other nonspecific abnormal finding of lung field: Secondary | ICD-10-CM | POA: Diagnosis not present

## 2015-08-26 DIAGNOSIS — F419 Anxiety disorder, unspecified: Secondary | ICD-10-CM | POA: Diagnosis present

## 2015-08-26 DIAGNOSIS — E669 Obesity, unspecified: Secondary | ICD-10-CM

## 2015-08-26 DIAGNOSIS — I129 Hypertensive chronic kidney disease with stage 1 through stage 4 chronic kidney disease, or unspecified chronic kidney disease: Secondary | ICD-10-CM | POA: Diagnosis present

## 2015-08-26 DIAGNOSIS — R339 Retention of urine, unspecified: Secondary | ICD-10-CM | POA: Insufficient documentation

## 2015-08-26 DIAGNOSIS — I69251 Hemiplegia and hemiparesis following other nontraumatic intracranial hemorrhage affecting right dominant side: Secondary | ICD-10-CM

## 2015-08-26 HISTORY — DX: Malignant melanoma of skin, unspecified: C43.9

## 2015-08-26 LAB — URINALYSIS, ROUTINE W REFLEX MICROSCOPIC
Bilirubin Urine: NEGATIVE
GLUCOSE, UA: NEGATIVE mg/dL
KETONES UR: NEGATIVE mg/dL
LEUKOCYTES UA: NEGATIVE
NITRITE: NEGATIVE
PROTEIN: 100 mg/dL — AB
Specific Gravity, Urine: 1.017 (ref 1.005–1.030)
pH: 5.5 (ref 5.0–8.0)

## 2015-08-26 LAB — URINE MICROSCOPIC-ADD ON

## 2015-08-26 LAB — BASIC METABOLIC PANEL
Anion gap: 15 (ref 5–15)
BUN: 28 mg/dL — ABNORMAL HIGH (ref 6–20)
CO2: 22 mmol/L (ref 22–32)
Calcium: 8.9 mg/dL (ref 8.9–10.3)
Chloride: 98 mmol/L — ABNORMAL LOW (ref 101–111)
Creatinine, Ser: 1.55 mg/dL — ABNORMAL HIGH (ref 0.61–1.24)
GFR calc Af Amer: 48 mL/min — ABNORMAL LOW (ref >60)
GFR calc non Af Amer: 42 mL/min — ABNORMAL LOW (ref >60)
Glucose, Bld: 119 mg/dL — ABNORMAL HIGH (ref 65–99)
Potassium: 4.3 mmol/L (ref 3.5–5.1)
Sodium: 135 mmol/L (ref 135–145)

## 2015-08-26 LAB — CBC
HCT: 41.8 % (ref 39.0–52.0)
Hemoglobin: 14.4 g/dL (ref 13.0–17.0)
MCH: 32.2 pg (ref 26.0–34.0)
MCHC: 34.4 g/dL (ref 30.0–36.0)
MCV: 93.5 fL (ref 78.0–100.0)
PLATELETS: 157 10*3/uL (ref 150–400)
RBC: 4.47 MIL/uL (ref 4.22–5.81)
RDW: 15.1 % (ref 11.5–15.5)
WBC: 9.1 10*3/uL (ref 4.0–10.5)

## 2015-08-26 MED ORDER — ACETAMINOPHEN 325 MG PO TABS: 325.0000 mg | ORAL_TABLET | ORAL | Status: DC | PRN

## 2015-08-26 MED ORDER — ADULT MULTIVITAMIN W/MINERALS CH
1.0000 | ORAL_TABLET | Freq: Every day | ORAL | Status: DC
  Administered 2015-08-27 – 2015-09-10 (×15): 1 via ORAL
  Filled 2015-08-26 (×15): qty 1

## 2015-08-26 MED ORDER — LISINOPRIL 10 MG PO TABS
10.0000 mg | ORAL_TABLET | Freq: Two times a day (BID) | ORAL | Status: DC
  Administered 2015-08-26 – 2015-08-28 (×4): 10 mg via ORAL
  Filled 2015-08-26 (×5): qty 1

## 2015-08-26 MED ORDER — CARVEDILOL 6.25 MG PO TABS
6.2500 mg | ORAL_TABLET | Freq: Two times a day (BID) | ORAL | Status: DC
  Administered 2015-08-26 – 2015-08-28 (×4): 6.25 mg via ORAL
  Filled 2015-08-26 (×5): qty 1

## 2015-08-26 MED ORDER — ACETAMINOPHEN 325 MG PO TABS
650.0000 mg | ORAL_TABLET | ORAL | Status: DC | PRN
  Administered 2015-08-27 – 2015-09-08 (×3): 650 mg via ORAL
  Filled 2015-08-26 (×3): qty 2

## 2015-08-26 MED ORDER — PROCHLORPERAZINE EDISYLATE 5 MG/ML IJ SOLN: 5.0000 mg | Freq: Four times a day (QID) | INTRAMUSCULAR | Status: DC | PRN

## 2015-08-26 MED ORDER — LEVOTHYROXINE SODIUM 25 MCG PO TABS
125.0000 ug | ORAL_TABLET | Freq: Every day | ORAL | Status: DC
  Administered 2015-08-27 – 2015-09-10 (×15): 125 ug via ORAL
  Filled 2015-08-26 (×16): qty 1

## 2015-08-26 MED ORDER — ALUM & MAG HYDROXIDE-SIMETH 200-200-20 MG/5ML PO SUSP: 30.0000 mL | ORAL | Status: DC | PRN

## 2015-08-26 MED ORDER — TRAZODONE HCL 50 MG PO TABS: 25.0000 mg | ORAL_TABLET | Freq: Every evening | ORAL | Status: DC | PRN

## 2015-08-26 MED ORDER — PROCHLORPERAZINE MALEATE 5 MG PO TABS: 5.0000 mg | ORAL_TABLET | Freq: Four times a day (QID) | ORAL | Status: DC | PRN

## 2015-08-26 MED ORDER — GUAIFENESIN-DM 100-10 MG/5ML PO SYRP: 5.0000 mL | ORAL_SOLUTION | Freq: Four times a day (QID) | ORAL | Status: DC | PRN

## 2015-08-26 MED ORDER — BISACODYL 10 MG RE SUPP
10.0000 mg | Freq: Every day | RECTAL | Status: DC | PRN
  Administered 2015-08-31: 10 mg via RECTAL
  Filled 2015-08-26: qty 1

## 2015-08-26 MED ORDER — FLEET ENEMA 7-19 GM/118ML RE ENEM: 1.0000 | ENEMA | Freq: Once | RECTAL | Status: DC | PRN

## 2015-08-26 MED ORDER — SENNOSIDES-DOCUSATE SODIUM 8.6-50 MG PO TABS
1.0000 | ORAL_TABLET | Freq: Two times a day (BID) | ORAL | Status: DC
  Administered 2015-08-27 – 2015-09-10 (×29): 1 via ORAL
  Filled 2015-08-26 (×30): qty 1

## 2015-08-26 MED ORDER — PANTOPRAZOLE SODIUM 40 MG PO TBEC
40.0000 mg | DELAYED_RELEASE_TABLET | Freq: Every day | ORAL | Status: DC
  Administered 2015-08-27 – 2015-09-10 (×15): 40 mg via ORAL
  Filled 2015-08-26 (×15): qty 1

## 2015-08-26 MED ORDER — PROCHLORPERAZINE 25 MG RE SUPP: 12.5000 mg | Freq: Four times a day (QID) | RECTAL | Status: DC | PRN

## 2015-08-26 MED ORDER — SERTRALINE HCL 100 MG PO TABS
100.0000 mg | ORAL_TABLET | Freq: Every day | ORAL | Status: DC
  Administered 2015-08-27 – 2015-08-30 (×4): 100 mg via ORAL
  Filled 2015-08-26: qty 2
  Filled 2015-08-26 (×3): qty 1

## 2015-08-26 MED ORDER — CLONIDINE HCL 0.1 MG PO TABS
0.1000 mg | ORAL_TABLET | ORAL | Status: DC | PRN
Start: 2015-08-26 — End: 2015-09-10

## 2015-08-26 MED ORDER — ROSUVASTATIN CALCIUM 10 MG PO TABS
10.0000 mg | ORAL_TABLET | Freq: Every day | ORAL | Status: DC
  Administered 2015-08-26 – 2015-09-09 (×15): 10 mg via ORAL
  Filled 2015-08-26 (×15): qty 1

## 2015-08-26 NOTE — Care Management Note (Signed)
Case Management Note  Patient Details  Name: David [name] MRN: [id number] Date of Birth: [birth year]  Subjective/Objective:                 Patient admitted with CVA.   Action/Plan:  Will DC to CIR today.  Expected Discharge Date:                  Expected Discharge Plan:  [place]  In-House Referral:     Discharge planning Services  CM Consult  Post Acute Care Choice:  NA Choice offered to:  Patient  DME Arranged:    DME Agency:     HH Arranged:    [place] Agency:     Status of Service:  Completed, signed off  Medicare Important Message Given:  Yes Date Medicare IM Given:    Medicare IM give by:    Date Additional Medicare IM Given:    Additional Medicare Important Message give by:     If discussed at [place] of Stay Meetings, dates discussed:    Additional Comments:  Carles Collet, RN 08/26/2015, 11:16 AM

## 2015-08-26 NOTE — Progress Notes (Signed)
Patient ID: David [name], male   DOB: 01-Sep-1940, 75 y.o.   MRN: [id number] Patient admitted to 4W01 via bed, escorted by nursing staff and family.  Patients son verbalized understanding of rehab process, signed fall safety agreement.  Patient appears to be in no immediate distress but is very drowsy and falls asleep during conversation.  Will continue to monitor.  Brita Romp, RN

## 2015-08-26 NOTE — Clinical Documentation Improvement (Signed)
Neurology  Can the diagnosis of CKD be further specified? Please document response in next progress note. Thank you.   CKD Stage I - GFR greater than or equal to 90  CKD Stage II - GFR 60-89  CKD Stage III - GFR 30-59  CKD Stage IV - GFR 15-29  CKD Stage V - GFR < 15  ESRD (End Stage Renal Disease)  Other condition  Unable to clinically determine  Supporting Information: : (risk factors, signs and symptoms, diagnostics, treatment)  White male  GFR's running from 52 to 59 this hospitalization  Please exercise your independent, professional judgment when responding. A specific answer is not anticipated or expected.  Thank You, Zoila Shutter RN, BSN, [place] [phone]; Cell: [phone]

## 2015-08-26 NOTE — Discharge Summary (Signed)
Stroke Discharge Summary  Patient ID: David [name]   MRN: [id number]      DOB: 03/21/1941  Date of Admission: 08/22/2015 Date of Discharge: 08/26/2015  Attending Physician:  Rosalin Hawking, MD, Stroke MD Consulting Physician(s):    Delice Lesch, MD (Physical Medicine & Rehabtilitation)  Patient's PCP:  No primary care provider on file.  Discharge Diagnoses:  Principal Problem:   ICH (intracerebral hemorrhage) ([place]) - L frontal due to HTN vs CAA Active Problems:   Benign essential HTN   OSA (obstructive sleep apnea)   CKD (chronic kidney disease)   Pacemaker   Hypertensive emergency   Hyperlipidemia   Obesity   Lung mass   Incontinence BMI  Body mass index is 30.26 kg/(m^2).  Past Medical History  Diagnosis Date  . Hypertension   . Sleep apnea     uses CPAP  . High cholesterol   . Renal disorder   . Pacemaker     due to syncope  . Rectal bleed     due to NSAIDS   Past Surgical History  Procedure Laterality Date  . Cardiac surgery      bypass X 2  . Knee arthroscopy  12/2008  . Foot surgery  08/2010    Medications to be continued on Rehab . carvedilol  6.25 mg Oral BID  . levothyroxine  125 mcg Oral QAC breakfast  . lisinopril  10 mg Oral BID  . multivitamin with minerals  1 tablet Oral Daily  . pantoprazole  40 mg Oral Daily  . rosuvastatin  10 mg Oral QHS  . senna-docusate  1 tablet Oral BID  . sertraline  100 mg Oral Daily    LABORATORY STUDIES CBC    Component Value Date/Time   WBC 9.1 08/26/2015 0704   RBC 4.47 08/26/2015 0704   HGB 14.4 08/26/2015 0704   HCT 41.8 08/26/2015 0704   PLT 157 08/26/2015 0704   MCV 93.5 08/26/2015 0704   MCH 32.2 08/26/2015 0704   MCHC 34.4 08/26/2015 0704   RDW 15.1 08/26/2015 0704   LYMPHSABS 0.5* 08/22/2015 2024   MONOABS 0.6 08/22/2015 2024   EOSABS 0.3 08/22/2015 2024   BASOSABS 0.0 08/22/2015 2024   CMP    Component Value Date/Time   NA 135 08/26/2015 0704   K 4.3 08/26/2015 0704   CL 98* 08/26/2015  0704   CO2 22 08/26/2015 0704   GLUCOSE 119* 08/26/2015 0704   BUN 28* 08/26/2015 0704   CREATININE 1.55* 08/26/2015 0704   CALCIUM 8.9 08/26/2015 0704   PROT 7.1 08/22/2015 2024   ALBUMIN 3.8 08/22/2015 2024   AST 29 08/22/2015 2024   ALT 23 08/22/2015 2024   ALKPHOS 54 08/22/2015 2024   BILITOT 1.0 08/22/2015 2024   GFRNONAA 42* 08/26/2015 0704   GFRAA 48* 08/26/2015 0704   COAGS Lab Results  Component Value Date   INR 1.03 08/22/2015   Lipid Panel    Component Value Date/Time   CHOL 215* 08/24/2015 0336   TRIG 196* 08/24/2015 0336   HDL 41 08/24/2015 0336   CHOLHDL 5.2 08/24/2015 0336   VLDL 39 08/24/2015 0336   LDLCALC 135* 08/24/2015 0336   HgbA1C  Lab Results  Component Value Date   HGBA1C 6.1* 08/23/2015    SIGNIFICANT DIAGNOSTIC STUDIES I have personally reviewed the radiological images below and agree with the radiology interpretations.  Ct Head Wo Contrast 08/22/2015  Large left frontal lobe intraparenchymal acute hemorrhage with associated mass effect and  approximately 6 mm left-to-right midline shift.   Chest Port 1 View 08/23/2015  Apparent left lower lobe mass measuring 3.8 x 3.0 cm. Advise correlation with chest CT, ideally with intravenous contrast, to further evaluate. Lungs elsewhere clear. Heart mildly enlarged with pacemaker leads as described. Patient is also status post coronary artery bypass grafting.   Ct Angio Head W/cm &/or Wo Cm 08/23/2015 Large left frontal hematoma unchanged from yesterday. No underlying vascular malformation. Consider amyloid and hypertension as causes of the hemorrhage.  CT Head 08/26/2015 Left frontal hematoma shows no interval enlargement. Similar local mass effect and vasogenic edema.  Carotid ultrasound There is no obvious evidence of hemodynamically significant internal carotid artery stenosis bilaterally. Vertebral arteries are patent with antegrade flow.  2D echocardiogram - Left ventricle: The cavity  size was normal. There was moderate concentric hypertrophy. Systolic function was vigorous. The estimated ejection fraction was in the range of 65% to 70%. Wall motion was normal; there were no regional wall motion abnormalities. Doppler parameters are consistent with abnormal left ventricular relaxation (grade 1 diastolic dysfunction). There was no evidence of elevated ventricular filling pressure by Doppler parameters. - Aortic valve: There was no regurgitation. - Aortic root: The aortic root was normal in size. - Ascending aorta: The ascending aorta was normal in size. - Mitral valve: There was no regurgitation. - Left atrium: The atrium was normal in size. - Right ventricle: The cavity size was normal. Wall thickness was normal. Systolic function was normal. - Right atrium: The atrium was normal in size. - Tricuspid valve: There was mild regurgitation. - Pulmonic valve: There was no regurgitation. - Pulmonary arteries: Systolic pressure was within the normal range. - Inferior vena cava: The vessel was normal in size. - Pericardium, extracardiac: There was no pericardial effusion.     HISTORY OF PRESENT ILLNESS David [name] is an 75 y.o. male history of hypertension, hypercholesterolemia, sleep apnea, renal disorder and pacemaker placement for AV node disorder, brought to the emergency room with new onset speech output difficulty and right lower facial weakness. He had difficulty with word finding as well as paraphasic errors. Onset was at 7 PM on 08/21/2015. He has no previous history of stroke nor TIA. He has not been on an anticoagulant. CT scan showed a 5.7 x 4.5 cm left frontal intraparenchymal hemorrhage. 6 mm left-to-right midline shift was also noted. Patient has had no receptive aphasia. He also has had no weakness involving right extremities and no visual changes. Patient's blood pressure was elevated at 176/111 on presentation. Cardene drip was started for management of hypertensive  urgency.   HOSPITAL COURSE [name] is a 75 y.o. male with history of hypertension, OSA, hyperlipidemia, CKD, and s/p permanent pacemaker presenting with speech difficulties and elevated blood pressure. CT showed a L frontal ICH w/ midline shift.  Stroke: Left frontal ICH with midline shift, likely secondary to hypertension vs. cerebral amyloid angiopathy  Resultant Partial expressive aphasia, agraphia  CT head - large left frontal ICH  CTA of head - no underlying vascular pathology  Carotid Doppler - no significant stenosis  Repeat CT head day of discharge stable  2D Echo EF 65-70%  LDL - 135  HgbA1c 6.1  No antithrombotic prior to admission, now on No antithrombotic secondary to hemorrhage.  Ongoing aggressive stroke risk factor management  Therapy recommendations: CIR  Disposition:  CIR  Acute on chronic CKD  Cre 1.5 on admission, likely due to dehydration  Cre 1.24-1.27-1.36->1.55  Encourage po fluid intake  Hypertensive Emergency  Blood pressure 174/103 on arrival in setting of neurologic symptoms  Increased coreg and resumed lisinopril  Now stabilized  Hyperlipidemia  Home meds: Crestor 10 mg daily   LDL 135, goal < 70  Resumed crestor 10  Continue statin at discharge  Other Stroke Risk Factors  Advanced age  ETOH use  Obesity, Body mass index is 30.26 kg/(m^2).  Other Active Problems  Apparent left lower lobe lung mass measuring 3.8 x 3.0 cm - further evaluation recommended - consider CT chest with contrast after Cre stabilized  Incontinence    DISCHARGE EXAM Blood pressure 124/69, pulse 75, temperature 97.8 F (36.6 C), temperature source Oral, resp. rate 16, height '5\' 10"'$  (1.778 m), weight 95.664 kg (210 lb 14.4 oz), SpO2 94 %. General - Well nourished, well developed, in no apparent distress.  Ophthalmologic - Fundi not visualized due to noncooperation.  Cardiovascular - Regular rate and rhythm with no  murmur.  Mental Status -  Level of arousal and orientation to time, place, and person were intact. Language exam showed partial expressive aphasia, difficulty with naming and complex sentence repeating. Agraphia. Intact calculation. Not able to do finger naming but intact left right orientation. Able to read. Did not test writing. Intact on following commands.   Cranial Nerves II - XII - II - Visual field intact OU. III, IV, VI - Extraocular movements intact. V - Facial sensation intact bilaterally. VII - Facial movement intact bilaterally. VIII - Hearing & vestibular intact bilaterally. X - Palate elevates symmetrically. XI - Chin turning & shoulder shrug intact bilaterally. XII - Tongue protrusion intact.  Motor Strength - The patient's strength was normal in all extremities and pronator drift was absent. Bulk was normal and fasciculations were absent.  Motor Tone - Muscle tone was assessed at the neck and appendages and was normal.  Reflexes - The patient's reflexes were 1+ in all extremities and he had no pathological reflexes.  Sensory - Light touch, temperature/pinprick were assessed and were symmetrical.   Coordination - The patient had normal movements in the hands with no ataxia or dysmetria. Tremor was absent.  Gait and Station - deferred due to safety concerns.  Discharge Diet  Diet Heart Room service appropriate?: Yes; Fluid consistency:: Thin liquids  DISCHARGE PLAN  Disposition:  Transfer to [facility] for ongoing PT, OT and ST  No aspirin or aspirin containing products  Recommend ongoing risk factor control by Primary Care Physician at time of discharge from inpatient rehabilitation.  Follow-up primary care provider in 2 weeks following discharge from rehab.get a primary MD if you do not have one  CT chest to follow up LLL mass seen on CXR  Follow-up with Dr. Rosalin Hawking, Stroke Clinic in 2 months, office to schedule an appointment.    45 minutes were spent preparing discharge.  [place] for Pager information 08/26/2015 3:18 PM   I, the attending vascular neurologist, have personally obtained a history, examined the patient, evaluated laboratory data, individually viewed imaging studies and agree with radiology interpretations. I also discussed with daughter over the phone regarding his care plan. Together with the NP/PA, we formulated the assessment and plan of care which reflects our mutual decision.  I have made any additions or clarifications directly to the above note and agree with the findings and plan as currently documented.   Rosalin Hawking, MD PhD Stroke Neurology 08/26/2015 7:12 PM

## 2015-08-26 NOTE — Progress Notes (Signed)
STROKE TEAM PROGRESS NOTE   SUBJECTIVE (INTERVAL HISTORY) His sister and wife are at the bedside.  He is stable neurologically. BP still at high side. Will increase his BP meds. Pending CIR.    OBJECTIVE Temp:  [97.9 F (36.6 C)-99.6 F (37.6 C)] 98.4 F (36.9 C) (05/17 2252) Pulse Rate:  [80-92] 81 (05/17 2252) Cardiac Rhythm:  [-] Ventricular paced (05/17 1952) Resp:  [16] 16 (05/17 2252) BP: (122-152)/(72-89) 152/79 mmHg (05/17 2252) SpO2:  [93 %-95 %] 94 % (05/17 2252)  CBC:  Recent Labs Lab 08/22/15 2024  08/24/15 0336 08/25/15 0533  WBC 5.8  < > 5.2 7.7  NEUTROABS 4.4  --   --   --   HGB 16.4  < > 14.4 14.8  HCT 47.1  < > 43.4 43.1  MCV 94.4  < > 94.6 94.5  PLT 159  < > 142* 162  < > = values in this interval not displayed.  Basic Metabolic Panel:   Recent Labs Lab 08/24/15 0336 08/25/15 0533  NA 138 138  K 4.1 4.2  CL 103 102  CO2 23 23  GLUCOSE 94 102*  BUN 21* 22*  CREATININE 1.27* 1.36*  CALCIUM 8.9 9.0    Lipid Panel:     Component Value Date/Time   CHOL 215* 08/24/2015 0336   TRIG 196* 08/24/2015 0336   HDL 41 08/24/2015 0336   CHOLHDL 5.2 08/24/2015 0336   VLDL 39 08/24/2015 0336   LDLCALC 135* 08/24/2015 0336   HgbA1c:  Lab Results  Component Value Date   HGBA1C 6.1* 08/23/2015   Urine Drug Screen: No results found for: LABOPIA, COCAINSCRNUR, LABBENZ, AMPHETMU, THCU, LABBARB    IMAGING I have personally reviewed the radiological images below and agree with the radiology interpretations.  Ct Head Wo Contrast 08/22/2015   Large left frontal lobe intraparenchymal acute hemorrhage with associated mass effect and approximately 6 mm left-to-right midline shift.   Chest Port 1 View 08/23/2015   Apparent left lower lobe mass measuring 3.8 x 3.0 cm. Advise correlation with chest CT, ideally with intravenous contrast, to further evaluate. Lungs elsewhere clear. Heart mildly enlarged with pacemaker leads as described. Patient is also status  post coronary artery bypass grafting.   Ct Angio Head W/cm &/or Wo Cm  08/23/2015  IMPRESSION: Large left frontal hematoma unchanged from yesterday. No underlying vascular malformation. Consider amyloid and hypertension as causes of the hemorrhage.  CUS - There is no obvious evidence of hemodynamically significant internal carotid artery stenosis bilaterally. Vertebral arteries are patent with antegrade flow.  TTE - - Procedure narrative: Transthoracic echocardiography. Image  quality was poor. The study was technically difficult, as a  result of poor sound wave transmission. - Left ventricle: The cavity size was normal. There was moderate  concentric hypertrophy. Systolic function was vigorous. The  estimated ejection fraction was in the range of 65% to 70%. Wall  motion was normal; there were no regional wall motion  abnormalities. Doppler parameters are consistent with abnormal  left ventricular relaxation (grade 1 diastolic dysfunction).  There was no evidence of elevated ventricular filling pressure by  Doppler parameters. - Aortic valve: There was no regurgitation. - Aortic root: The aortic root was normal in size. - Ascending aorta: The ascending aorta was normal in size. - Mitral valve: There was no regurgitation. - Left atrium: The atrium was normal in size. - Right ventricle: The cavity size was normal. Wall thickness was  normal. Systolic function was normal. - Right  atrium: The atrium was normal in size. - Tricuspid valve: There was mild regurgitation. - Pulmonic valve: There was no regurgitation. - Pulmonary arteries: Systolic pressure was within the normal  range. - Inferior vena cava: The vessel was normal in size. - Pericardium, extracardiac: There was no pericardial effusion.   PHYSICAL EXAM  Temp:  [97.9 F (36.6 C)-99.6 F (37.6 C)] 98.4 F (36.9 C) (05/17 2252) Pulse Rate:  [80-92] 81 (05/17 2252) Resp:  [16] 16 (05/17 2252) BP:  (122-152)/(72-89) 152/79 mmHg (05/17 2252) SpO2:  [93 %-95 %] 94 % (05/17 2252)  General - Well nourished, well developed, in no apparent distress.  Ophthalmologic - Fundi not visualized due to noncooperation.  Cardiovascular - Regular rate and rhythm with no murmur.  Mental Status -  Level of arousal and orientation to time, place, and person were intact. Language exam showed partial expressive aphasia, difficulty with naming and complex sentence repeating. Agraphia. Intact calculation. Not able to do finger naming but intact left right orientation. Able to read. Did not test writing. Intact on following commands.   Cranial Nerves II - XII - II - Visual field intact OU. III, IV, VI - Extraocular movements intact. V - Facial sensation intact bilaterally. VII - Facial movement intact bilaterally. VIII - Hearing & vestibular intact bilaterally. X - Palate elevates symmetrically. XI - Chin turning & shoulder shrug intact bilaterally. XII - Tongue protrusion intact.  Motor Strength - The patient's strength was normal in all extremities and pronator drift was absent.  Bulk was normal and fasciculations were absent.   Motor Tone - Muscle tone was assessed at the neck and appendages and was normal.  Reflexes - The patient's reflexes were 1+ in all extremities and he had no pathological reflexes.  Sensory - Light touch, temperature/pinprick were assessed and were symmetrical.    Coordination - The patient had normal movements in the hands with no ataxia or dysmetria.  Tremor was absent.  Gait and Station - deferred due to safety concerns.   ASSESSMENT/PLAN David [name] is a 75 y.o. male with history of hypertension, OSA, hyperlipidemia, CKD, and s/p permanent pacemaker presenting with speech difficulties and elevated blood pressure. He did not receive IV t-PA due to intraparenchymal hemorrhage.  Stroke:  Left frontal ICH likely secondary to hypertension vs. CAA.  Resultant   Partial expressive aphasia, agraphia  CT head - left frontal large ICH  CTA of head - no underlying vascular pathology  Carotid Doppler - no significant stenosis  2D Echo EF 65-70%  LDL - 135  HgbA1c 6.1  VTE prophylaxis - SCDs Diet Heart Room service appropriate?: Yes; Fluid consistency:: Thin  No antithrombotic prior to admission, now on No antithrombotic secondary to hemorrhage.  Ongoing aggressive stroke risk factor management  Therapy recommendations: CIR  Disposition: Pending  Acute on chronic CKD  Cre 1.5 on admission, likely due to dehydration  Cre 1.24-1.27-1.36  Encourage po intake  D/c IVF today  Hypertension  Blood pressure mildly high  BP goal < 160  Put back on coreg and lisinopril home meds  Increase coreg to 6.'25mg'$  bid  Hyperlipidemia  Home meds:  Crestor 10 mg daily currently on hold  LDL 135, goal < 70  Resume crestor 10  Continue statin at discharge  Other Stroke Risk Factors  Advanced age  ETOH use  Obesity, Body mass index is 30.26 kg/(m^2).   Other Active Problems  Apparent left lower lobe lung mass measuring 3.8 x 3.0 cm - further  evaluation recommended - consider CT chest with contrast after Cre stabilized   Hospital day # 4   Rosalin Hawking, MD PhD Stroke Neurology 08/26/2015 12:51 AM    To contact Stroke Continuity provider, please refer to [email or web address] After hours, contact General Neurology

## 2015-08-26 NOTE — Progress Notes (Signed)
Physical Therapy Treatment Patient Details Name: David [name] MRN: [id number] DOB: 10/07/1940 Today's Date: 08/26/2015    History of Present Illness 75 y.o. male history of hypertension, hypercholesterolemia, sleep apnea, renal disorder and pacemaker brought to the emergency room with new onset speech output difficulty (word finding and paraphasic errors) and right lower facial weakness. CT scan showed a 5.7 x 4.5 cm left frontal intraparenchymal hemorrhage. 6 mm left-to-right midline shift was also noted.      PT Comments    Plan is for repeat CT today and transfer to CIR if no changes.  Follow Up Recommendations  CIR;Supervision/Assistance - 24 hour     Equipment Recommendations  None recommended by PT    Recommendations for Other Services       Precautions / Restrictions Precautions Precautions: Fall    Mobility  Bed Mobility Overal bed mobility: Needs Assistance Bed Mobility: Supine to Sit;Sit to Supine     Supine to sit: Min assist Sit to supine: Min assist   General bed mobility comments: Physical assist required today to scoot to EOB during supine <> sit and to get BLEs back in bed during sit <> supine.  Transfers Overall transfer level: Needs assistance Equipment used: Rolling walker (2 wheeled) Transfers: Sit to/from [facility]   Stand pivot transfers: Mod assist       General transfer comment: Verbal/tactile cues for hand placement and sequencing. Assist to power up. Pt trembling upon stance hindering ability to progress with gait.  Ambulation/Gait                 Stairs            Wheelchair Mobility    Modified Rankin (Stroke Patients Only) Modified Rankin (Stroke Patients Only) Pre-Morbid Rankin Score: No symptoms Modified Rankin: Moderately severe disability     Balance Overall balance assessment: Needs assistance Sitting-balance support: No upper extremity supported;Feet supported Sitting balance-Leahy  Scale: Good     Standing balance support: Bilateral upper extremity supported;During functional activity Standing balance-Leahy Scale: Poor                      Cognition Arousal/Alertness: Awake/alert Behavior During Therapy: Flat affect Overall Cognitive Status: Impaired/Different from baseline Area of Impairment: Attention;Memory;Following commands;Safety/judgement;Awareness;Problem solving   Current Attention Level: Sustained Memory: Decreased short-term memory Following Commands: Follows one step commands inconsistently;Follows one step commands with increased time Safety/Judgement: Decreased awareness of safety;Decreased awareness of deficits Awareness: Intellectual Problem Solving: Slow processing;Decreased initiation;Difficulty sequencing;Requires verbal cues;Requires tactile cues General Comments: Pt responds better with gestural cues than verbal cues. He appears very frustrated with his expressive aphasia.    Exercises      General Comments        Pertinent Vitals/Pain Pain Assessment: No/denies pain    Home Living                      Prior Function            PT Goals (current goals can now be found in the care plan section) Acute Rehab PT Goals Patient Stated Goal: to get better (per wife) PT Goal Formulation: With patient/family Time For Goal Achievement: 09/07/15 Potential to Achieve Goals: Good Progress towards PT goals: Progressing toward goals    Frequency  Min 3X/week    PT Plan Current plan remains appropriate    Co-evaluation             End of Session Equipment Utilized  During Treatment: Gait belt Activity Tolerance: Patient limited by fatigue;Treatment limited secondary to medical complications (Comment) Patient left: in bed;with call bell/phone within reach;with bed alarm set     Time: 1040-1120 PT Time Calculation (min) (ACUTE ONLY): 40 min  Charges:  $Therapeutic Activity: 38-52 mins                    G  Codes:      Lorriane Shire 08/26/2015, 12:13 PM

## 2015-08-26 NOTE — Progress Notes (Signed)
Rehab admissions - I met with patient, but he is aphasic.  I obtained permission to call his family.  I spoke with his daughter.  Daughter wants inpatient rehab here at [facility].  I spoke with Dr. Erlinda Hong who says that patient is medically ready for inpatient rehab today.  Bed available and will admit to acute inpatient rehab today.  Call me for questions.  #[phone]

## 2015-08-26 NOTE — H&P (Signed)
Physical Medicine and Rehabilitation Admission H&P     Chief Complaint   Patient presents with   .  Right facial weakness and global aphsia    HPI:  David [name] is a 75 y.o. male with history of HTN, OSA, CKD, PPM who was admitted on 08/22/15 with expressive difficulty. He was found to have left frontal hemorrhage 5.7 cm x 4.5 cm with 6 mm left to right shift.  Blood pressure 176/111 at admission and he was started on Cardene drip. Carotid dopplers without significant ICA stenosis. TEE with EF 65-70% with grade one diastolic dysfunction. Follow up CTA head showed stable large left frontal hematoma without vascular malformations. Speech therapy evaluation done showing global aphasia with receptive > expressive deficits including dysfluency, paraphasias and dysnomia. Dr. Erlinda Hong felt that bleed due to uncontrolled HTN v/s CAA.   Lisinopril titrated for better control.  Incidental note of LLL mass 3.8 X 3.0 cm noted--CT with contrast recommended once renal status improves.  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 Wife with memory problems but his sister lives close by. PT/OT evaluation showed cognitive deficits and processing affecting mobility and ability to carry out ADL tasks.   Repeat CT head ordered today due to family's concerns of decline/incontinence-The scan was repeated and showed no changes compared to prior scan on 5/15   Review of Systems  Unable to perform ROS: mental acuity        Past Medical History   Diagnosis  Date   .  Hypertension     .  Sleep apnea         uses CPAP   .  High cholesterol     .  Renal disorder     .  Pacemaker         due to syncope   .  Rectal bleed         due to NSAIDS       Past Surgical History   Procedure  Laterality  Date   .  Cardiac surgery           bypass X 2   .  Knee arthroscopy    12/2008   .  Foot surgery    08/2010    Family History   Problem  Relation  Age of Onset   .  Cancer  Mother      .  Hypertension  Mother     .  Hypertension  Father     .  Heart attack  Father     .  Rheum arthritis  Sister       Social History:   Married. Independent PTA--has been limited by back pain. Per  reports that he has smokes cigars occasionally.  He does not have any smokeless tobacco history on file.  Pt reports that he drinks wine and occasional liquor --"too much" per sister.  Per  reports that he does not use illicit drugs.    Allergies: No Known Allergies      Medications Prior to Admission   Medication  Sig  Dispense  Refill   .  carvedilol (COREG) 3.125 MG tablet  Take 3.125 mg by mouth 2 (two) times daily with a meal.       .  levothyroxine (SYNTHROID, LEVOTHROID) 125 MCG tablet  Take 125 mcg by mouth daily before breakfast.       .  lisinopril (PRINIVIL,ZESTRIL) 5 MG tablet  Take 5 mg by mouth  2 (two) times daily.       .  Multiple Vitamin (MULTIVITAMIN WITH MINERALS) TABS tablet  Take 1 tablet by mouth daily.       .  naproxen sodium (ALEVE) 220 MG tablet  Take 220 mg by mouth 2 (two) times daily as needed (pain).       Marland Kitchen  PRESCRIPTION MEDICATION  Inhale into the lungs at bedtime. CPAP       .  rosuvastatin (CRESTOR) 10 MG tablet  Take 10 mg by mouth at bedtime.       .  sertraline (ZOLOFT) 100 MG tablet  Take 100 mg by mouth daily.       .  Turmeric Curcumin 500 MG CAPS  Take 500 mg by mouth 2 (two) times daily.         Home: Home Living Family/patient expects to be discharged to:: Private residence Living Arrangements: Spouse/significant other, Children Available Help at Discharge: Family Type of Home: House Home Access: Stairs to enter Technical brewer of Steps: 3 Entrance Stairs-Rails: None Home Layout: Able to live on main level with bedroom/bathroom Bathroom Toilet: Standard Home Equipment: None    Functional History: Prior Function Level of Independence: Independent  Functional Status:  Mobility: Bed Mobility Overal bed mobility: Needs  Assistance Bed Mobility: Supine to Sit Supine to sit: Min guard General bed mobility comments: Pt able to sit EOB with 1 verbal and gestural cue. Required increased time and effort. Transfers Overall transfer level: Needs assistance Equipment used: 2 person hand held assist, Rolling walker (2 wheeled) Transfers: Sit to/from Stand Sit to Stand: Mod assist General transfer comment: Min-mod assist for boost to stand and balance upon standing. Initially used 1-2 person hand held assist, but pt reaching for wall outside BoS, pt performed better with RW and min assist on R side. Pt demonstrated weakness on R side and was unable to clear R foot from floor while ambulating. Gestural/tactile cues for safe hand placement while using RW and cues to bring awareness to R side drifting.   Ambulation/Gait Ambulation/Gait assistance: Min assist Ambulation Distance (Feet): 80 Feet Assistive device: 1 person hand held assist Gait Pattern/deviations: Decreased stride length, Drifts right/left, Narrow base of support General Gait Details: patient very rigid and guarded with gait Gait velocity: decreased    ADL: ADL Overall ADL's : Needs assistance/impaired Lower Body Bathing: Maximal assistance, Cueing for sequencing, Sit to/from stand Lower Body Bathing Details (indicate cue type and reason): Assist for sit-stand and to thoroughly complete washing. With 2 verbal cues, pt unable to get wash cloth, but pt responded better with 1 gestural cue.    Upper Body Dressing : Minimal assistance, Cueing for sequencing, Sitting Upper Body Dressing Details (indicate cue type and reason): Pt able to remove gown when asked and put arms through clean gown sleeves with 1 verbal cues. However, when asked to "Put this gown on like a robe", pt attempted but was unable to complete task as asked. Lower Body Dressing: Minimal assistance, Sit to/from stand Lower Body Dressing Details (indicate cue type and reason): Handed pt 1 sock  at a time and pt automatically put them on and did not require verbal cues. Assist to fully don socks as pt visibly fatigued, but was unable to ask for help until therapist prompted. Toilet Transfer: Moderate assistance, Cueing for sequencing, Ambulation, BSC, RW Toilet Transfer Details (indicate cue type and reason): Assist for sit-stand, better response to gestural cues. Toileting- Clothing Manipulation and Hygiene: Maximal assistance, Cueing  for sequencing, Sit to/from stand Toileting - Clothing Manipulation Details (indicate cue type and reason): Pt unaware of BM incontinence in bed on OT arrival. Assist for sit-stand, pericare and dressing tasks. Functional mobility during ADLs: Minimal assistance, Rolling walker, Cueing for safety, Cueing for sequencing General ADL Comments: Pt responds better to gestural cues and requires increased time and effort. Pt also responds better when given 2 choices due to functional communication deficits.   Cognition: Cognition Overall Cognitive Status: Impaired/Different from baseline Arousal/Alertness: Awake/alert Orientation Level: Oriented to person, Oriented to place, Oriented to time, Disoriented to situation (expressive aphasia ) Attention: Sustained Sustained Attention: Appears intact Memory:  (appeared intact during assessment, will continue to evaluate) Awareness: Impaired Awareness Impairment: Anticipatory impairment Problem Solving:  (will continue to assess) Safety/Judgment:  (will continue to assess) Cognition Arousal/Alertness: Awake/alert Behavior During Therapy: Flat affect Overall Cognitive Status: Impaired/Different from baseline Area of Impairment: Attention, Memory, Following commands, Safety/judgement, Awareness, Problem solving Current Attention Level: Sustained Memory: Decreased short-term memory Following Commands: Follows one step commands inconsistently, Follows one step commands with increased time Safety/Judgement:  Decreased awareness of safety, Decreased awareness of deficits Awareness: Intellectual Problem Solving: Slow processing, Decreased initiation, Difficulty sequencing, Requires verbal cues, Requires tactile cues General Comments: Pt has motor planning, functional communication, and attention deficits impacting his ability to perform daily self-care tasks. Pt appears to have difficulty initiating and sequencing tasks and responds better with gestural cues than verbal cues. Unsure if pt demonstrates decreased attention or visual perceptual deficits as pt ran therapist into laundry cart on R side and required cues to avoid doing this. When provided simple choices (2), pt was able to verbalize that he wanted water, but was unable to respond to open ended question "What would you like to drink."   Difficult to assess due to: Impaired communication (Appears expressive>receptive)   Blood pressure 127/73, pulse 77, temperature 97.4 F (36.3 C), temperature source Oral, resp. rate 16, height 5' 10"  (1.778 m), weight 95.664 kg (210 lb 14.4 oz), SpO2 90 %. Physical Exam  Nursing note and vitals reviewed. Constitutional: He appears well-developed and well-nourished.  HENT:   Head: Normocephalic and atraumatic.   Mouth/Throat: Oropharynx is clear and moist.  Eyes: Conjunctivae are normal. Pupils are equal, round, and reactive to light. Right eye exhibits no discharge. Left eye exhibits no discharge.  Neck: Normal range of motion. Neck supple.  Cardiovascular: Normal rate and regular rhythm.    Murmur heard. Respiratory: Effort normal and breath sounds normal. No stridor. No respiratory distress. He has no wheezes.  GI: Soft. Bowel sounds are normal. He exhibits no distension. There is no tenderness.  Musculoskeletal: He exhibits  edema and tenderness, Left ankle. Patient has pain with left ankle range of motion Neurological: He is alert.  Able to state name. Perseveration due to frustrated. Able to state  name and follow occasional motor commands with visual/tactile cues. Unable to answer basic Y/N questions with choice of two.  Has component of apraxia. Motor strength is 4 minus in the right deltoid, biceps, triceps, grip, hip flexor, knee extensor, ankle dorsiflexor 55/5 in the left deltoid, biceps, triceps, grip, hip flexor, knee extensor left ankle dorsiflexor 4 minus limited by pain Skin: Skin is warm and dry. No rash noted. No erythema.  Psychiatric: His mood appears anxious. His speech is delayed.      Lab Results Last 48 Hours    Results for orders placed or performed during the hospital encounter of 08/22/15 (from the past 48  hour(s))   CBC     Status: None     Collection Time: 08/25/15  5:33 AM   Result  Value  Ref Range     WBC  7.7  4.0 - 10.5 K/uL     RBC  4.56  4.22 - 5.81 MIL/uL     Hemoglobin  14.8  13.0 - 17.0 g/dL     HCT  43.1  39.0 - 52.0 %     MCV  94.5  78.0 - 100.0 fL     MCH  32.5  26.0 - 34.0 pg     MCHC  34.3  30.0 - 36.0 g/dL     RDW  15.3  11.5 - 15.5 %     Platelets  162  150 - 400 K/uL   Basic metabolic panel     Status: Abnormal     Collection Time: 08/25/15  5:33 AM   Result  Value  Ref Range     Sodium  138  135 - 145 mmol/L     Potassium  4.2  3.5 - 5.1 mmol/L     Chloride  102  101 - 111 mmol/L     CO2  23  22 - 32 mmol/L     Glucose, Bld  102 (H)  65 - 99 mg/dL     BUN  22 (H)  6 - 20 mg/dL     Creatinine, Ser  1.36 (H)  0.61 - 1.24 mg/dL     Calcium  9.0  8.9 - 10.3 mg/dL     GFR calc non Af Amer  49 (L)  >60 mL/min     GFR calc Af Amer  57 (L)  >60 mL/min       Comment:  (NOTE)  The eGFR has been calculated using the CKD EPI equation. This calculation has not been validated in all clinical situations. eGFR's persistently <60 mL/min signify possible Chronic Kidney Disease.      Anion gap  13  5 - 15   CBC     Status: None     Collection Time: 08/26/15  7:04 AM   Result  Value  Ref Range     WBC  9.1  4.0 - 10.5 K/uL     RBC  4.47  4.22 -  5.81 MIL/uL     Hemoglobin  14.4  13.0 - 17.0 g/dL     HCT  41.8  39.0 - 52.0 %     MCV  93.5  78.0 - 100.0 fL     MCH  32.2  26.0 - 34.0 pg     MCHC  34.4  30.0 - 36.0 g/dL     RDW  15.1  11.5 - 15.5 %     Platelets  157  150 - 400 K/uL   Basic metabolic panel     Status: Abnormal     Collection Time: 08/26/15  7:04 AM   Result  Value  Ref Range     Sodium  135  135 - 145 mmol/L     Potassium  4.3  3.5 - 5.1 mmol/L     Chloride  98 (L)  101 - 111 mmol/L     CO2  22  22 - 32 mmol/L     Glucose, Bld  119 (H)  65 - 99 mg/dL     BUN  28 (H)  6 - 20 mg/dL     Creatinine, Ser  1.55 (H)  0.61 -  1.24 mg/dL     Calcium  8.9  8.9 - 10.3 mg/dL     GFR calc non Af Amer  42 (L)  >60 mL/min     GFR calc Af Amer  48 (L)  >60 mL/min       Comment:  (NOTE)  The eGFR has been calculated using the CKD EPI equation. This calculation has not been validated in all clinical situations. eGFR's persistently <60 mL/min signify possible Chronic Kidney Disease.      Anion gap  15  5 - 15       Imaging Results (Last 48 hours)    No results found.       Medical Problem List and Plan: 1.  Right hemiparesis, a facial, apraxia and cognitive deficits secondary to Left frontal intracranial hypertensive hemorrhage 2.  DVT Prophylaxis/Anticoagulation: Mechanical: Sequential compression devices, below knee Bilateral lower extremities 3. Pain Management: tylenol prn 4. Mood:  Seems frustrated due to expressive/receptive deficits. Team to provide ego support. LCSW to follow for evaluation and support.   5. Neuropsych: This patient is not capable of making decisions on his own behalf. 6. Skin/Wound Care: routine pressure relief measures 7. Fluids/Electrolytes/Nutrition:  Monitor I/O. Check lytes in am. ] 8.  CAD s/p CABG: On coreg and crestor 9. HTN: Monitor BP tid. Continue Lisinopril bid and titrate as needed for tighter control.   10. CKD: BUN/Creatinine 32/1.5 at admission and currently at baseline.    Encourage fluid intake.   11. Depression: Continue zoloft daily.  12. Hypothyroid: Resume supplement.   13. LLL mass: CT chest when as renal status stabilizes.   14. Incontinence: Likely due to cognitive and processing issues--will check UA/UCS. Toilet every 3 hours while awake.    Post Admission Physician Evaluation: 1. Functional deficits secondary  to Left frontal intracranial hypertensive hemorrhage. 2. Patient is admitted to receive collaborative, interdisciplinary care between the physiatrist, rehab nursing staff, and therapy team. 3. Patient's level of medical complexity and substantial therapy needs in context of that medical necessity cannot be provided at a lesser intensity of care such as a SNF. 4. Patient has experienced substantial functional loss from his/her baseline which was documented above under the "Functional History" and "Functional Status" headings.  Judging by the patient's diagnosis, physical exam, and functional history, the patient has potential for functional progress which will result in measurable gains while on inpatient rehab.  These gains will be of substantial and practical use upon discharge  in facilitating mobility and self-care at the household level. 5. Physiatrist will provide 24 hour management of medical needs as well as oversight of the therapy plan/treatment and provide guidance as appropriate regarding the interaction of the two. 6. 24 hour rehab nursing will assist with bladder management, bowel management, safety, skin/wound care, disease management, medication administration, pain management and patient education  and help integrate therapy concepts, techniques,education, etc. 7. PT will assess and treat for/with: pre gait, gait training, endurance , safety, equipment, neuromuscular re education.   Goals are: Supervision//min assist. 8. OT will assess and treat for/with: ADLs, Cognitive perceptual skills, Neuromuscular re education, safety, endurance,  equipment.   Goals are: Supervision/min assist. Therapy May proceed with showering this patient. 9. SLP will assess and treat for/with: Attention, contact attention, aphasia, swallowing.  Goals are: Safe and adequate by mouth intake, express basic needs at a sentence level. 10. Case Management and Social Worker will assess and treat for psychological issues and discharge planning. 11. Team conference will be held  weekly to assess progress toward goals and to determine barriers to discharge. 12. Patient will receive at least 3 hours of therapy per day at least 5 days per week. 13. ELOS: 21-25 days        14. Prognosis: good     Charlett Blake M.D. [facility] FAAPM&R (Sports Med, Neuromuscular Med) Diplomate [place] Board of Electrodiagnostic Med  08/26/2015

## 2015-08-26 NOTE — Progress Notes (Signed)
Ankit Lorie Phenix, MD Physician Signed Physical Medicine and Rehabilitation Consult Note 08/25/2015 11:58 AM  Related encounter: ED to Hosp-Admission (Current) from 08/22/2015 in [place] All Collapse All        Physical Medicine and Rehabilitation Consult   Reason for Consult: Right facial weakness and global aphasia.  Referring Physician: Dr. Erlinda Hong   HPI: David [name] is a 75 y.o. male with history of HTN, OSA, CKD, PPM who was admitted on 08/22/15 with expressive difficulty. He was found to have left frontal hemorrhage 5.7 cm x 4.5 cm with 6 mm left to right shift. Blood pressure 176/111 at admission and he was started on Cardene drip. Carotid dopplers without significant ICA stenosis. TEE with EF 65-70% with grade one diastolic dysfunction. Follow up CTA head showed stable large left frontal hematoma without vascular malformations. Speech therapy evaluation done showing global aphasia with receptive > expressive deficits including dysfluency, paraphasias and dysnomia. PT evaluation showed cognitive deficits and processing affecting mobility and CIR recommended by rehab team.   Lives with wife at [facility]. Daughter is Dr. [name] at [facility].   Review of Systems  Unable to perform ROS: language     Past Medical History  Diagnosis Date  . Hypertension   . Sleep apnea   . High cholesterol   . Renal disorder   . Pacemaker     Past Surgical History  Procedure Laterality Date  . Cardiac surgery    . Knee arthroscopy  12/2008  . Foot surgery  08/2010    History reviewed. No pertinent family history.    Social History: Married. Lives with wife. He smoked briefly in college and was exposed to second hand smoke. Uses cigar occasionally. He does not have any smokeless tobacco history on file. Per reports that he drinks alcohol--"too much" per  sister. Per reports that he does not use illicit drugs.    Allergies: No Known Allergies    Medications Prior to Admission  Medication Sig Dispense Refill  . carvedilol (COREG) 3.125 MG tablet Take 3.125 mg by mouth 2 (two) times daily with a meal.    . levothyroxine (SYNTHROID, LEVOTHROID) 125 MCG tablet Take 125 mcg by mouth daily before breakfast.    . lisinopril (PRINIVIL,ZESTRIL) 5 MG tablet Take 5 mg by mouth 2 (two) times daily.    . Multiple Vitamin (MULTIVITAMIN WITH MINERALS) TABS tablet Take 1 tablet by mouth daily.    . naproxen sodium (ALEVE) 220 MG tablet Take 220 mg by mouth 2 (two) times daily as needed (pain).    Marland Kitchen PRESCRIPTION MEDICATION Inhale into the lungs at bedtime. CPAP    . rosuvastatin (CRESTOR) 10 MG tablet Take 10 mg by mouth at bedtime.    . sertraline (ZOLOFT) 100 MG tablet Take 100 mg by mouth daily.    . Turmeric Curcumin 500 MG CAPS Take 500 mg by mouth 2 (two) times daily.      Home: Home Living Family/patient expects to be discharged to:: Private residence Living Arrangements: Spouse/significant other, Children Available Help at Discharge: Family Type of Home: House Home Access: Stairs to enter Technical brewer of Steps: 3 Entrance Stairs-Rails: None Home Layout: Able to live on main level with bedroom/bathroom Bathroom Toilet: Standard Home Equipment: None  Functional History: Prior Function Level of Independence: Independent Functional Status:  Mobility: Bed Mobility Overal bed mobility: Needs Assistance Bed Mobility: Supine to Sit Supine to sit: Min  assist General bed mobility comments: when coming to EOB, patient could not problem solve how to get LE in position around bed rail, assist to complete task and come to EOB. Transfers Overall transfer level: Needs assistance Equipment used: 1 person hand held assist Transfers: Sit to/from Stand Sit to Stand: Min assist General  transfer comment: min assist for stability Ambulation/Gait Ambulation/Gait assistance: Min assist Ambulation Distance (Feet): 80 Feet Assistive device: 1 person hand held assist Gait Pattern/deviations: Decreased stride length, Drifts right/left, Narrow base of support General Gait Details: patient very rigid and guarded with gait Gait velocity: decreased    ADL:    Cognition: Cognition Overall Cognitive Status: Impaired/Different from baseline Arousal/Alertness: Awake/alert Orientation Level: Oriented to person, Oriented to place, Oriented to time (expressive aphasia ) Attention: Sustained Sustained Attention: Appears intact Memory: (appeared intact during assessment, will continue to evaluate) Awareness: Impaired Awareness Impairment: Anticipatory impairment Problem Solving: (will continue to assess) Safety/Judgment: (will continue to assess) Cognition Arousal/Alertness: Awake/alert Behavior During Therapy: Flat affect Overall Cognitive Status: Impaired/Different from baseline Area of Impairment: Following commands, Awareness, Problem solving Following Commands: Follows one step commands with increased time Awareness: Intellectual Problem Solving: Slow processing, Decreased initiation, Difficulty sequencing, Requires verbal cues, Requires tactile cues General Comments: patient with intermittent difficulty problem sovling through tasks such as getting his LE around the side rail to sit at EOB Difficult to assess due to: Impaired communication   Blood pressure 122/72, pulse 92, temperature 97.9 F (36.6 C), temperature source Oral, resp. rate 16, height '5\' 10"'$  (1.778 m), weight 95.664 kg (210 lb 14.4 oz), SpO2 95 %. Physical Exam  Vitals reviewed. Constitutional: He appears well-developed and well-nourished.  HENT:  Head: Normocephalic and atraumatic.  Eyes: Conjunctivae and EOM are normal.  Neck: No JVD present. No tracheal deviation present.  Cardiovascular: Normal  rate and regular rhythm.  Respiratory: Effort normal and breath sounds normal.  GI: Soft. Bowel sounds are normal.  Musculoskeletal: He exhibits no edema or tenderness.  Neurological: He is alert.  Rec >Exp aphasia DTRs symmetric Right facial weakness Inconsistently follows commands Unable to assess sensation Motor: ?Grossly 4+/5 throughout  Skin: Skin is warm and dry.  Psychiatric:  Unable to assess     Lab Results Last 24 Hours    Results for orders placed or performed during the hospital encounter of 08/22/15 (from the past 24 hour(s))  CBC Status: None   Collection Time: 08/25/15 5:33 AM  Result Value Ref Range   WBC 7.7 4.0 - 10.5 K/uL   RBC 4.56 4.22 - 5.81 MIL/uL   Hemoglobin 14.8 13.0 - 17.0 g/dL   HCT 43.1 39.0 - 52.0 %   MCV 94.5 78.0 - 100.0 fL   MCH 32.5 26.0 - 34.0 pg   MCHC 34.3 30.0 - 36.0 g/dL   RDW 15.3 11.5 - 15.5 %   Platelets 162 150 - 400 K/uL  Basic metabolic panel Status: Abnormal   Collection Time: 08/25/15 5:33 AM  Result Value Ref Range   Sodium 138 135 - 145 mmol/L   Potassium 4.2 3.5 - 5.1 mmol/L   Chloride 102 101 - 111 mmol/L   CO2 23 22 - 32 mmol/L   Glucose, Bld 102 (H) 65 - 99 mg/dL   BUN 22 (H) 6 - 20 mg/dL   Creatinine, Ser 1.36 (H) 0.61 - 1.24 mg/dL   Calcium 9.0 8.9 - 10.3 mg/dL   GFR calc non Af Amer 49 (L) >60 mL/min   GFR calc Af Amer 57 (  L) >60 mL/min   Anion gap 13 5 - 15      Imaging Results (Last 48 hours)    Ct Angio Head W/cm &/or Wo Cm  08/23/2015 CLINICAL DATA: Intracranial hemorrhage. EXAM: CT ANGIOGRAPHY HEAD TECHNIQUE: Multidetector CT imaging of the head was performed using the standard protocol during bolus administration of intravenous contrast. Multiplanar CT image reconstructions and MIPs were obtained to evaluate the vascular anatomy. CONTRAST: 100 mL Omnipaque 300 IV COMPARISON: CT head 08/22/2015  FINDINGS: CT HEAD Brain: Left frontal lobe hemorrhage is unchanged measuring 5.9 x 4.7 cm. There is surrounding edema which shows mild progression. No new area of hemorrhage. There is mass-effect on the left frontal lobe with mild midline shift to the right of 5 mm which appears unchanged. No ventricular hemorrhage. Negative for acute ischemic infarct. Calvarium and skull base: Negative Paranasal sinuses: Mild mucosal edema in the sphenoid sinus. Mastoid sinus is clear. Orbits: No orbital mass. Bilateral lens replacement. Scleral buckle on the left. CTA HEAD Anterior circulation: Cavernous carotid widely patent bilaterally. No cavernous stenosis or aneurysm. Anterior and middle cerebral arteries are patent bilaterally. No stenosis or occluded segments. No aneurysm or vascular malformation to account for the left frontal hemorrhage. Posterior circulation: Both vertebral arteries patent to the basilar. Left vertebral dominant. PICA patent bilaterally. Basilar patent. Superior cerebellar and posterior cerebral arteries patent bilaterally without stenosis. Venous sinuses: Patent Anatomic variants: None Delayed phase:Normal enhancement on delayed imaging. No enhancing mass lesion identified. IMPRESSION: Large left frontal hematoma unchanged from yesterday. No underlying vascular malformation. Consider amyloid and hypertension as causes of the hemorrhage. Electronically Signed By: Franchot Gallo M.D. On: 08/23/2015 17:38     Assessment/Plan: Diagnosis: Left frontal hemorrhage Labs and images independently reviewed. Records reviewed and summated above. Stroke: Continue secondary stroke prophylaxis and Risk Factor Modification listed below:  Blood Pressure Management: Continue current medication with prn's with permisive HTN per primary   1. Does the need for close, 24 hr/day medical supervision in concert with the patient's rehab needs make it unreasonable for this patient to be served in a less  intensive setting? Yes  2. Co-Morbidities requiring supervision/potential complications: HTN (monitor and provide prns in accordance with increased physical exertion and pain), OSA (encourage CPAP, monitor for daytime somnolence), CKD (avoid nephrotoxic meds), PPM  3. Due to bladder management, safety, disease management, medication administration and patient education, does the patient require 24 hr/day rehab nursing? Yes 4. Does the patient require coordinated care of a physician, rehab nurse, PT (1-2 hrs/day, 5 days/week), OT (1-2 hrs/day, 5 days/week) and SLP (1-2 hrs/day, 5 days/week) to address physical and functional deficits in the context of the above medical diagnosis(es)? Yes Addressing deficits in the following areas: balance, endurance, locomotion, strength, transferring, bathing, dressing, toileting, cognition, speech, language and psychosocial support 5. Can the patient actively participate in an intensive therapy program of at least 3 hrs of therapy per day at least 5 days per week? Yes 6. The potential for patient to make measurable gains while on inpatient rehab is excellent 7. Anticipated functional outcomes upon discharge from inpatient rehab are supervision with PT, supervision and min assist with OT, min assist and mod assist with SLP. 8. Estimated rehab length of stay to reach the above functional goals is: 20-24 days. 9. Does the patient have adequate social supports and living environment to accommodate these discharge functional goals? Yes 10. Anticipated D/C setting: Home 11. Anticipated post D/C treatments: HH therapy and Home excercise program 12. Overall Rehab/Functional  Prognosis: good  RECOMMENDATIONS: This patient's condition is appropriate for continued rehabilitative care in the following setting: CIR Patient has agreed to participate in recommended program. Potentially Note that insurance prior authorization may be required for reimbursement for recommended  care.  Comment: Rehab Admissions Coordinator to follow up.  Delice Lesch, MD 08/25/2015       Revision History     Date/Time User Provider Type Action   08/25/2015 1:31 PM Ankit Lorie Phenix, MD Physician Sign   08/25/2015 12:53 PM Bary Leriche, PA-C Physician Assistant [facility] Details Report

## 2015-08-26 NOTE — PMR Pre-admission (Signed)
PMR Admission Coordinator Pre-Admission Assessment  Patient: David [name] is an 75 y.o., male MRN: [id number] DOB: 12/28/1940 Height: '5\' 10"'$  (177.8 cm) Weight: 95.664 kg (210 lb 14.4 oz)              Insurance Information HMO: No    PPO:       PCP:       IPA:       80/20:       OTHER:   PRIMARY: Medicare A/B      Policy#: [id number]      Subscriber: [name] CM Name:        Phone#:       Fax#:   Pre-Cert#:        Employer: Self employed - side business Benefits:  Phone #:       Name: Checked in [place]. Date: 06/08/04     Deduct: $1316      Out of Pocket Max: none      Life Max: unlimited CIR: 100%      SNF: 100 days Outpatient: 80%     Co-Pay: 20% Home Health: 100%      Co-Pay: none DME: 80%     Co-Pay: 20% Providers: patient's choice  SECONDARY:        Policy#:        Subscriber:   CM Name:        Phone#:       Fax#:   Pre-Cert#:        Employer:   Benefits:  Phone #:       Name:   Eff. Date:       Deduct:        Out of Pocket Max:        Life Max:   CIR:        SNF:   Outpatient:       Co-Pay:   Home Health:        Co-Pay:   DME:       Co-Pay:    Emergency Contact Information Contact Information    Name Relation Home Work [place] Daughter   [phone]   Navarro,Jacqueline  [phone]       Current Medical History  Patient Admitting Diagnosis:  Left frontal hemorrhage  History of Present Illness: A 75 y.o. male with history of HTN, OSA, CKD, PPM who was admitted on 08/22/15 with expressive difficulty. He was found to have left frontal hemorrhage 5.7 cm x 4.5 cm with 6 mm left to right shift. Blood pressure 176/111 at admission and he was started on Cardene drip. Carotid dopplers without significant ICA stenosis. TEE with EF 65-70% with grade one diastolic dysfunction. Follow up CTA head showed stable large left frontal hematoma without vascular malformations. Speech therapy evaluation done showing global aphasia with receptive > expressive  deficits including dysfluency, paraphasias and dysnomia. Dr. Erlinda Hong felt that bleed due to uncontrolled HTN v/s CAA.   Lisinopril titrated for better control. Incidental note of LLL mass 3.8 X 3.0 cm noted--CT with contrast recommended once renal satus improves. 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 Wife with memory problems but his sister lives close by. PT/OT evaluation showed cognitive deficits and processing affecting mobility and ability to carry out ADL tasks. CIR recommended by rehab team.   Repeat CT head ordered today due to family's concerns of decline/incontinence and repeat CT shows no interval enlargement. Similar local mass effect and vasogenic edema.  Total: 7=NIH  Past Medical History  Past Medical History  Diagnosis Date  . Hypertension   . Sleep apnea     uses CPAP  . High cholesterol   . Renal disorder   . Pacemaker     due to syncope  . Rectal bleed     due to NSAIDS    Family History  family history includes Cancer in his mother; Heart attack in his father; Hypertension in his father and mother; Rheum arthritis in his sister.  Prior Rehab/Hospitalizations: No previous rehab.  Has the patient had major surgery during 100 days prior to admission? No  Current Medications   Current facility-administered medications:  .  acetaminophen (TYLENOL) tablet 650 mg, 650 mg, Oral, Q4H PRN, 650 mg at 08/25/15 1330 **OR** [DISCONTINUED] acetaminophen (TYLENOL) suppository 650 mg, 650 mg, Rectal, Q4H PRN, Wallie Char .  carvedilol (COREG) tablet 6.25 mg, 6.25 mg, Oral, BID, Rosalin Hawking, MD, 6.25 mg at 08/26/15 1100 .  labetalol (NORMODYNE,TRANDATE) injection 10 mg, 10 mg, Intravenous, Q2H PRN, Rosalin Hawking, MD .  levothyroxine (SYNTHROID, LEVOTHROID) tablet 125 mcg, 125 mcg, Oral, QAC breakfast, Rosalin Hawking, MD, 125 mcg at 08/26/15 0755 .  lisinopril (PRINIVIL,ZESTRIL) tablet 10 mg, 10 mg, Oral, BID, Rosalin Hawking, MD, 10 mg at  08/26/15 1100 .  multivitamin with minerals tablet 1 tablet, 1 tablet, Oral, Daily, Rosalin Hawking, MD, 1 tablet at 08/26/15 1059 .  pantoprazole (PROTONIX) EC tablet 40 mg, 40 mg, Oral, Daily, Rosalin Hawking, MD, 40 mg at 08/26/15 1059 .  rosuvastatin (CRESTOR) tablet 10 mg, 10 mg, Oral, QHS, Rosalin Hawking, MD, 10 mg at 08/25/15 2252 .  senna-docusate (Senokot-S) tablet 1 tablet, 1 tablet, Oral, BID, Wallie Char, 1 tablet at 08/26/15 1100 .  sertraline (ZOLOFT) tablet 100 mg, 100 mg, Oral, Daily, Rosalin Hawking, MD, 100 mg at 08/26/15 1059  Patients Current Diet: Diet Heart Room service appropriate?: Yes; Fluid consistency:: Thin  Precautions / Restrictions Precautions Precautions: Fall Restrictions Weight Bearing Restrictions: No   Has the patient had 2 or more falls or a fall with injury in the past year?No  Prior Activity Level Community (5-7x/wk): Went out daily.  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  Was driving.  Home Assistive Devices / Equipment Home Assistive Devices/Equipment: None Home Equipment: None  Prior Device Use: Indicate devices/aids used by the patient prior to current illness, exacerbation or injury? None  Prior Functional Level Prior Function Level of Independence: Independent  Self Care: Did the patient need help bathing, dressing, using the toilet or eating?  Independent  Indoor Mobility: Did the patient need assistance with walking from room to room (with or without device)? Independent  Stairs: Did the patient need assistance with internal or external stairs (with or without device)? Independent  Functional Cognition: Did the patient need help planning regular tasks such as shopping or remembering to take medications? Independent  Current Functional Level Cognition  Arousal/Alertness: Awake/alert Overall Cognitive Status: Impaired/Different from baseline Difficult to assess due to: Impaired communication Current Attention Level:  Sustained Orientation Level: Oriented to person, Oriented to place, Oriented to time, Disoriented to situation (expressive aphasia ) Following Commands: Follows one step commands inconsistently, Follows one step commands with increased time Safety/Judgement: Decreased awareness of safety, Decreased awareness of deficits General Comments: Pt responds better with gestural cues than verbal cues. He appears very frustrated with his expressive aphasia. Attention: Sustained Sustained Attention: Appears intact Memory:  (appeared intact during assessment, will continue to evaluate) Awareness: Impaired  Awareness Impairment: Anticipatory impairment Problem Solving:  (will continue to assess) Safety/Judgment:  (will continue to assess)    Extremity Assessment (includes Sensation/Coordination)     Lower Extremity Assessment:  (strength WFL, some coordination deficits)    ADLs  Overall ADL's : Needs assistance/impaired Lower Body Bathing: Maximal assistance, Cueing for sequencing, Sit to/from stand Lower Body Bathing Details (indicate cue type and reason): Assist for sit-stand and to thoroughly complete washing. With 2 verbal cues, pt unable to get wash cloth, but pt responded better with 1 gestural cue.   Upper Body Dressing : Minimal assistance, Cueing for sequencing, Sitting Upper Body Dressing Details (indicate cue type and reason): Pt able to remove gown when asked and put arms through clean gown sleeves with 1 verbal cues. However, when asked to "Put this gown on like a robe", pt attempted but was unable to complete task as asked. Lower Body Dressing: Minimal assistance, Sit to/from stand Lower Body Dressing Details (indicate cue type and reason): Handed pt 1 sock at a time and pt automatically put them on and did not require verbal cues. Assist to fully don socks as pt visibly fatigued, but was unable to ask for help until therapist prompted. Toilet Transfer: Moderate assistance, Cueing for  sequencing, Ambulation, BSC, RW Toilet Transfer Details (indicate cue type and reason): Assist for sit-stand, better response to gestural cues. Toileting- Clothing Manipulation and Hygiene: Maximal assistance, Cueing for sequencing, Sit to/from stand Toileting - Clothing Manipulation Details (indicate cue type and reason): Pt unaware of BM incontinence in bed on OT arrival. Assist for sit-stand, pericare and dressing tasks. Functional mobility during ADLs: Minimal assistance, Rolling walker, Cueing for safety, Cueing for sequencing General ADL Comments: Pt responds better to gestural cues and requires increased time and effort. Pt also responds better when given 2 choices due to functional communication deficits.    Mobility  Overal bed mobility: Needs Assistance Bed Mobility: Supine to Sit, Sit to Supine Supine to sit: Min assist Sit to supine: Min assist General bed mobility comments: Physical assist required today to scoot to EOB during supine <> sit and to get BLEs back in bed during sit <> supine.    Transfers  Overall transfer level: Needs assistance Equipment used: Rolling walker (2 wheeled) Transfers: Sit to/from Stand, [employer] Transfers Sit to Stand: Mod assist Stand pivot transfers: Mod assist General transfer comment: Verbal/tactile cues for hand placement and sequencing. Assist to power up. Pt trembling upon stance hindering ability to progress with gait.    Ambulation / Gait / Stairs / Wheelchair Mobility  Ambulation/Gait Ambulation/Gait assistance: Museum/gallery curator (Feet): 80 Feet Assistive device: 1 person hand held assist Gait Pattern/deviations: Decreased stride length, Drifts right/left, Narrow base of support General Gait Details: patient very rigid and guarded with gait Gait velocity: decreased    Posture / Balance Balance Overall balance assessment: Needs assistance Sitting-balance support: No upper extremity supported, Feet supported Sitting  balance-Leahy Scale: Good Standing balance support: Bilateral upper extremity supported, During functional activity Standing balance-Leahy Scale: Poor Standing balance comment: Unable to maintain balance without UE support from table, wall, therapist or RW    Special needs/care consideration BiPAP/CPAP Yes, CPAP at home CPM No Continuous Drip IV 0.9% NS 50 mL/hr  Dialysis No        Life Vest No Oxygen No Special Bed No Trach Size No Wound Vac (area) No     Skin H/O skin cancers/melanoma  Bowel mgmt: Incontinence BM documented 08/25/15 Bladder mgmt: Voiding in urinal Diabetic mgmt No    Previous Home Environment Living Arrangements: Spouse/significant other, Children Available Help at Discharge: Family Type of Home: House Home Layout: Able to live on main level with bedroom/bathroom Home Access: Stairs to enter Entrance Stairs-Rails: None Entrance Stairs-Number of Steps: 3 Bathroom Toilet: [place]: No  Discharge Living Setting Plans for Discharge Living Setting: House, Lives with (comment) (Lives in a home at [facility] in [place].) Type of Home at Discharge: [place] Name at Discharge: Maia Breslow Discharge Home Layout: One level Discharge Home Access: Stairs to enter Entrance Stairs-Number of Steps: 2 steps Does the patient have any problems obtaining your medications?: No  Social/Family/Support Systems Patient Roles: Spouse, Parent (Has a wife at home with dementia.  Daughter works at [employer] ) Sport and exercise psychologist Information: Silas [place] - daughter [phone] Anticipated Caregiver: Wife can provide supervision and very light assistance per daughter. Anticipated Caregiver's Contact Information: Declan Mier - wife (h) [phone] Ability/Limitations of Caregiver: Wife has dementia.  Daughter works at [employer]. Caregiver Availability: 24/7 Discharge Plan Discussed with Primary  Caregiver: Yes Is Caregiver In Agreement with Plan?: Yes Does Caregiver/Family have Issues with Lodging/Transportation while Pt is in Rehab?: No  Goals/Additional Needs Patient/Family Goal for Rehab: PT/OT supervision to min assist, ST min to mod assist goals Expected length of stay: 20-24 days Cultural Considerations: [facility] Dietary Needs: Heart diet, thin liquids Equipment Needs: TBD Pt/Family Agrees to Admission and willing to participate: Yes Program Orientation Provided & Reviewed with Pt/Caregiver Including Roles  & Responsibilities: Yes  Decrease burden of Care through IP rehab admission: N/A  Possible need for SNF placement upon discharge: Yes, if patient cannot be managed by wife after rehab stay.  Patient Condition: This patient's condition remains as documented in the consult dated 08/25/15, in which the Rehabilitation Physician determined and documented that the patient's condition is appropriate for intensive rehabilitative care in an inpatient rehabilitation facility. Will admit to inpatient rehab today.  Preadmission Screen Completed By:  Retta Diones, 08/26/2015 2:01 PM ______________________________________________________________________   Discussed status with Dr. Letta Pate on 08/26/15 at 1400 and received telephone approval for admission today.  Admission Coordinator:  Retta Diones, time1400/Date05/18/17

## 2015-08-26 NOTE — Progress Notes (Signed)
David Diones, RN Rehab Admission Coordinator Signed Physical Medicine and Rehabilitation PMR Pre-admission 08/26/2015 12:17 PM  Related encounter: ED to Hosp-Admission (Current) from 08/22/2015 in [place] All Collapse All   PMR Admission Coordinator Pre-Admission Assessment  Patient: David [name] is an 75 y.o., male MRN: [id number] DOB: June 27, 1940 Height: '5\' 10"'$  (177.8 cm) Weight: 95.664 kg (210 lb 14.4 oz)  Insurance Information HMO: No PPO: PCP: IPA: 80/20: OTHER:  PRIMARY: Medicare A/B Policy#: [id number] Subscriber: David [name] CM Name: Phone#: Fax#:  Pre-Cert#: Employer: Self employed - side business Benefits: Phone #: Name: Checked in [place]. Date: 06/08/04 Deduct: $1316 Out of Pocket Max: none Life Max: unlimited CIR: 100% SNF: 100 days Outpatient: 80% Co-Pay: 20% Home Health: 100% Co-Pay: none DME: 80% Co-Pay: 20% Providers: patient's choice  SECONDARY: Policy#: Subscriber:  CM Name: Phone#: Fax#:  Pre-Cert#: Employer:  Benefits: Phone #: Name:  Eff. Date: Deduct: Out of Pocket Max: Life Max:  CIR: SNF:  Outpatient: Co-Pay:  Home Health: Co-Pay:  DME: Co-Pay:   Emergency Contact Information Contact Information    Name Relation Home Work [place] Daughter   [phone]   [name],David  [phone]       Current Medical History  Patient Admitting Diagnosis: Left frontal hemorrhage  History of Present Illness: A 75 y.o. male with history of HTN, OSA, CKD, PPM who was admitted on 08/22/15 with expressive difficulty. He  was found to have left frontal hemorrhage 5.7 cm x 4.5 cm with 6 mm left to right shift. Blood pressure 176/111 at admission and he was started on Cardene drip. Carotid dopplers without significant ICA stenosis. TEE with EF 65-70% with grade one diastolic dysfunction. Follow up CTA head showed stable large left frontal hematoma without vascular malformations. Speech therapy evaluation done showing global aphasia with receptive > expressive deficits including dysfluency, paraphasias and dysnomia. Dr. Erlinda Hong felt that bleed due to uncontrolled HTN v/s CAA.   Lisinopril titrated for better control. Incidental note of LLL mass 3.8 X 3.0 cm noted--CT with contrast recommended once renal satus improves. 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 Wife with memory problems but his sister lives close by. PT/OT evaluation showed cognitive deficits and processing affecting mobility and ability to carry out ADL tasks. CIR recommended by rehab team.   Repeat CT head ordered today due to family's concerns of decline/incontinence and repeat CT shows no interval enlargement. Similar local mass effect and vasogenic edema.  Total: 7=NIH  Past Medical History  Past Medical History  Diagnosis Date  . Hypertension   . Sleep apnea     uses CPAP  . High cholesterol   . Renal disorder   . Pacemaker     due to syncope  . Rectal bleed     due to NSAIDS    Family History  family history includes Cancer in his mother; Heart attack in his father; Hypertension in his father and mother; Rheum arthritis in his sister.  Prior Rehab/Hospitalizations: No previous rehab.  Has the patient had major surgery during 100 days prior to admission? No  Current Medications   Current facility-administered medications:  . acetaminophen (TYLENOL) tablet 650 mg, 650 mg, Oral, Q4H PRN, 650 mg at 08/25/15 1330 **OR** [DISCONTINUED] acetaminophen (TYLENOL)  suppository 650 mg, 650 mg, Rectal, Q4H PRN, Wallie Char . carvedilol (COREG) tablet 6.25 mg, 6.25 mg, Oral, BID, Rosalin Hawking, MD, 6.25 mg  at 08/26/15 1100 . labetalol (NORMODYNE,TRANDATE) injection 10 mg, 10 mg, Intravenous, Q2H PRN, Rosalin Hawking, MD . levothyroxine (SYNTHROID, LEVOTHROID) tablet 125 mcg, 125 mcg, Oral, QAC breakfast, Rosalin Hawking, MD, 125 mcg at 08/26/15 0755 . lisinopril (PRINIVIL,ZESTRIL) tablet 10 mg, 10 mg, Oral, BID, Rosalin Hawking, MD, 10 mg at 08/26/15 1100 . multivitamin with minerals tablet 1 tablet, 1 tablet, Oral, Daily, Rosalin Hawking, MD, 1 tablet at 08/26/15 1059 . pantoprazole (PROTONIX) EC tablet 40 mg, 40 mg, Oral, Daily, Rosalin Hawking, MD, 40 mg at 08/26/15 1059 . rosuvastatin (CRESTOR) tablet 10 mg, 10 mg, Oral, QHS, Rosalin Hawking, MD, 10 mg at 08/25/15 2252 . senna-docusate (Senokot-S) tablet 1 tablet, 1 tablet, Oral, BID, Wallie Char, 1 tablet at 08/26/15 1100 . sertraline (ZOLOFT) tablet 100 mg, 100 mg, Oral, Daily, Rosalin Hawking, MD, 100 mg at 08/26/15 1059  Patients Current Diet: Diet Heart Room service appropriate?: Yes; Fluid consistency:: Thin  Precautions / Restrictions Precautions Precautions: Fall Restrictions Weight Bearing Restrictions: No   Has the patient had 2 or more falls or a fall with injury in the past year?No  Prior Activity Level Community (5-7x/wk): Went out daily. 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 Was driving.  Home Assistive Devices / Equipment Home Assistive Devices/Equipment: None Home Equipment: None  Prior Device Use: Indicate devices/aids used by the patient prior to current illness, exacerbation or injury? None  Prior Functional Level Prior Function Level of Independence: Independent  Self Care: Did the patient need help bathing, dressing, using the toilet or eating? Independent  Indoor Mobility: Did the patient need assistance with walking from room to room (with or without device)?  Independent  Stairs: Did the patient need assistance with internal or external stairs (with or without device)? Independent  Functional Cognition: Did the patient need help planning regular tasks such as shopping or remembering to take medications? Independent  Current Functional Level Cognition  Arousal/Alertness: Awake/alert Overall Cognitive Status: Impaired/Different from baseline Difficult to assess due to: Impaired communication Current Attention Level: Sustained Orientation Level: Oriented to person, Oriented to place, Oriented to time, Disoriented to situation (expressive aphasia ) Following Commands: Follows one step commands inconsistently, Follows one step commands with increased time Safety/Judgement: Decreased awareness of safety, Decreased awareness of deficits General Comments: Pt responds better with gestural cues than verbal cues. He appears very frustrated with his expressive aphasia. Attention: Sustained Sustained Attention: Appears intact Memory: (appeared intact during assessment, will continue to evaluate) Awareness: Impaired Awareness Impairment: Anticipatory impairment Problem Solving: (will continue to assess) Safety/Judgment: (will continue to assess)   Extremity Assessment (includes Sensation/Coordination)     Lower Extremity Assessment: (strength WFL, some coordination deficits)    ADLs  Overall ADL's : Needs assistance/impaired Lower Body Bathing: Maximal assistance, Cueing for sequencing, Sit to/from stand Lower Body Bathing Details (indicate cue type and reason): Assist for sit-stand and to thoroughly complete washing. With 2 verbal cues, pt unable to get wash cloth, but pt responded better with 1 gestural cue.  Upper Body Dressing : Minimal assistance, Cueing for sequencing, Sitting Upper Body Dressing Details (indicate cue type and reason): Pt able to remove gown when asked and put arms through clean gown sleeves with 1 verbal cues.  However, when asked to "Put this gown on like a robe", pt attempted but was unable to complete task as asked. Lower Body Dressing: Minimal assistance, Sit to/from stand Lower Body Dressing Details (indicate cue type and reason): Handed pt 1 sock at a time and pt automatically  put them on and did not require verbal cues. Assist to fully don socks as pt visibly fatigued, but was unable to ask for help until therapist prompted. Toilet Transfer: Moderate assistance, Cueing for sequencing, Ambulation, BSC, RW Toilet Transfer Details (indicate cue type and reason): Assist for sit-stand, better response to gestural cues. Toileting- Clothing Manipulation and Hygiene: Maximal assistance, Cueing for sequencing, Sit to/from stand Toileting - Clothing Manipulation Details (indicate cue type and reason): Pt unaware of BM incontinence in bed on OT arrival. Assist for sit-stand, pericare and dressing tasks. Functional mobility during ADLs: Minimal assistance, Rolling walker, Cueing for safety, Cueing for sequencing General ADL Comments: Pt responds better to gestural cues and requires increased time and effort. Pt also responds better when given 2 choices due to functional communication deficits.    Mobility  Overal bed mobility: Needs Assistance Bed Mobility: Supine to Sit, Sit to Supine Supine to sit: Min assist Sit to supine: Min assist General bed mobility comments: Physical assist required today to scoot to EOB during supine <> sit and to get BLEs back in bed during sit <> supine.    Transfers  Overall transfer level: Needs assistance Equipment used: Rolling walker (2 wheeled) Transfers: Sit to/from Stand, [employer] Transfers Sit to Stand: Mod assist Stand pivot transfers: Mod assist General transfer comment: Verbal/tactile cues for hand placement and sequencing. Assist to power up. Pt trembling upon stance hindering ability to progress with gait.    Ambulation / Gait / Stairs / Wheelchair  Mobility  Ambulation/Gait Ambulation/Gait assistance: Museum/gallery curator (Feet): 80 Feet Assistive device: 1 person hand held assist Gait Pattern/deviations: Decreased stride length, Drifts right/left, Narrow base of support General Gait Details: patient very rigid and guarded with gait Gait velocity: decreased    Posture / Balance Balance Overall balance assessment: Needs assistance Sitting-balance support: No upper extremity supported, Feet supported Sitting balance-Leahy Scale: Good Standing balance support: Bilateral upper extremity supported, During functional activity Standing balance-Leahy Scale: Poor Standing balance comment: Unable to maintain balance without UE support from table, wall, therapist or RW    Special needs/care consideration BiPAP/CPAP Yes, CPAP at home CPM No Continuous Drip IV 0.9% NS 50 mL/hr  Dialysis No  Life Vest No Oxygen No Special Bed No Trach Size No Wound Vac (area) No  Skin H/O skin cancers/melanoma  Bowel mgmt: Incontinence BM documented 08/25/15 Bladder mgmt: Voiding in urinal Diabetic mgmt No    Previous Home Environment Living Arrangements: Spouse/significant other, Children Available Help at Discharge: Family Type of Home: House Home Layout: Able to live on main level with bedroom/bathroom Home Access: Stairs to enter Entrance Stairs-Rails: None Entrance Stairs-Number of Steps: 3 Biochemist, clinical: Standard Home Care Services: No  Discharge Living Setting Plans for Discharge Living Setting: House, Lives with (comment) (Lives in a home at [facility] in [place].) Type of Home at Discharge: [place] Name at Discharge: Maia Breslow Discharge Home Layout: One level Discharge Home Access: Stairs to enter Entrance Stairs-Number of Steps: 2 steps Does the patient have any problems obtaining your medications?: No  Social/Family/Support  Systems Patient Roles: Spouse, Parent (Has a wife at home with dementia. Daughter works at [employer] ) Sport and exercise psychologist Information: Silas [place] - daughter [phone] Anticipated Caregiver: Wife can provide supervision and very light assistance per daughter. Anticipated Caregiver's Contact Information: Kien Mirsky - wife (h) [phone] Ability/Limitations of Caregiver: Wife has dementia. Daughter works at [employer]. Caregiver Availability: 24/7 Discharge Plan Discussed with Primary Caregiver: Yes  Is Caregiver In Agreement with Plan?: Yes Does Caregiver/Family have Issues with Lodging/Transportation while Pt is in Rehab?: No  Goals/Additional Needs Patient/Family Goal for Rehab: PT/OT supervision to min assist, ST min to mod assist goals Expected length of stay: 20-24 days Cultural Considerations: [facility] Dietary Needs: Heart diet, thin liquids Equipment Needs: TBD Pt/Family Agrees to Admission and willing to participate: Yes Program Orientation Provided & Reviewed with Pt/Caregiver Including Roles & Responsibilities: Yes  Decrease burden of Care through IP rehab admission: N/A  Possible need for SNF placement upon discharge: Yes, if patient cannot be managed by wife after rehab stay.  Patient Condition: This patient's condition remains as documented in the consult dated 08/25/15, in which the Rehabilitation Physician determined and documented that the patient's condition is appropriate for intensive rehabilitative care in an inpatient rehabilitation facility. Will admit to inpatient rehab today.  Preadmission Screen Completed By: David [name], 08/26/2015 2:01 PM ______________________________________________________________________  Discussed status with Dr. Letta Pate on 08/26/15 at 1400 and received telephone approval for admission today.  Admission Coordinator: David [name], time1400/Date05/18/17          Cosigned by: Charlett Blake, MD at 08/26/2015  2:33 PM  Revision History     Date/Time User Provider Type Action   08/26/2015 2:33 PM Charlett Blake, MD Physician Cosign   08/26/2015 2:01 PM David Diones, RN Rehab Admission Coordinator Sign

## 2015-08-27 ENCOUNTER — Inpatient Hospital Stay (HOSPITAL_COMMUNITY): Payer: Medicare Other | Admitting: Occupational Therapy

## 2015-08-27 ENCOUNTER — Inpatient Hospital Stay (HOSPITAL_COMMUNITY): Payer: Medicare Other | Admitting: Physical Therapy

## 2015-08-27 ENCOUNTER — Inpatient Hospital Stay (HOSPITAL_COMMUNITY): Payer: Medicare Other | Admitting: Speech Pathology

## 2015-08-27 ENCOUNTER — Inpatient Hospital Stay (HOSPITAL_COMMUNITY): Payer: Medicare Other

## 2015-08-27 DIAGNOSIS — I69120 Aphasia following nontraumatic intracerebral hemorrhage: Secondary | ICD-10-CM

## 2015-08-27 DIAGNOSIS — I69398 Other sequelae of cerebral infarction: Secondary | ICD-10-CM

## 2015-08-27 DIAGNOSIS — R269 Unspecified abnormalities of gait and mobility: Secondary | ICD-10-CM

## 2015-08-27 DIAGNOSIS — I69151 Hemiplegia and hemiparesis following nontraumatic intracerebral hemorrhage affecting right dominant side: Principal | ICD-10-CM

## 2015-08-27 DIAGNOSIS — I611 Nontraumatic intracerebral hemorrhage in hemisphere, cortical: Secondary | ICD-10-CM

## 2015-08-27 LAB — COMPREHENSIVE METABOLIC PANEL
ALK PHOS: 63 U/L (ref 38–126)
ALT: 21 U/L (ref 17–63)
AST: 28 U/L (ref 15–41)
Albumin: 3.2 g/dL — ABNORMAL LOW (ref 3.5–5.0)
Anion gap: 13 (ref 5–15)
BILIRUBIN TOTAL: 0.9 mg/dL (ref 0.3–1.2)
BUN: 37 mg/dL — AB (ref 6–20)
CALCIUM: 9.4 mg/dL (ref 8.9–10.3)
CO2: 25 mmol/L (ref 22–32)
CREATININE: 1.61 mg/dL — AB (ref 0.61–1.24)
Chloride: 96 mmol/L — ABNORMAL LOW (ref 101–111)
GFR calc Af Amer: 46 mL/min — ABNORMAL LOW (ref >60)
GFR, EST NON AFRICAN AMERICAN: 40 mL/min — AB (ref >60)
Glucose, Bld: 113 mg/dL — ABNORMAL HIGH (ref 65–99)
Potassium: 4.6 mmol/L (ref 3.5–5.1)
Sodium: 134 mmol/L — ABNORMAL LOW (ref 135–145)
TOTAL PROTEIN: 7.7 g/dL (ref 6.5–8.1)

## 2015-08-27 LAB — CBC WITH DIFFERENTIAL/PLATELET
BASOS ABS: 0 10*3/uL (ref 0.0–0.1)
Basophils Relative: 0 %
Eosinophils Absolute: 0.1 10*3/uL (ref 0.0–0.7)
Eosinophils Relative: 1 %
HEMATOCRIT: 44.9 % (ref 39.0–52.0)
Hemoglobin: 15.3 g/dL (ref 13.0–17.0)
LYMPHS PCT: 4 %
Lymphs Abs: 0.4 10*3/uL — ABNORMAL LOW (ref 0.7–4.0)
MCH: 32.1 pg (ref 26.0–34.0)
MCHC: 34.1 g/dL (ref 30.0–36.0)
MCV: 94.3 fL (ref 78.0–100.0)
MONO ABS: 1.2 10*3/uL — AB (ref 0.1–1.0)
Monocytes Relative: 12 %
NEUTROS ABS: 8.5 10*3/uL — AB (ref 1.7–7.7)
Neutrophils Relative %: 83 %
Platelets: 208 10*3/uL (ref 150–400)
RBC: 4.76 MIL/uL (ref 4.22–5.81)
RDW: 15.1 % (ref 11.5–15.5)
WBC: 10.2 10*3/uL (ref 4.0–10.5)

## 2015-08-27 LAB — URINE CULTURE

## 2015-08-27 MED ORDER — SELENIUM SULFIDE 1 % EX LOTN
TOPICAL_LOTION | Freq: Every day | CUTANEOUS | Status: DC
  Administered 2015-08-28 – 2015-09-10 (×13): via TOPICAL
  Filled 2015-08-27 (×2): qty 207

## 2015-08-27 NOTE — Progress Notes (Signed)
Patient information reviewed and entered into eRehab system by Caine Barfield, RN, CRRN, PPS Coordinator.  Information including medical coding and functional independence measure will be reviewed and updated through discharge.     Per nursing patient was given "Data Collection Information Summary for Patients in Inpatient Rehabilitation Facilities with attached "Privacy Act Statement-Health Care Records" upon admission.  

## 2015-08-27 NOTE — IPOC Note (Signed)
Overall Plan of Care [facility]) Patient Details Name: David [name] MRN: [id number] DOB: Aug 13, 1940  Admitting Diagnosis: LT FRONTAL HEMMORRHAGE  Hospital Problems: Principal Problem:   Hemiplegia and hemiparesis following nontraumatic intracerebral hemorrhage affecting right dominant side ([place]) Active Problems:   ICH (intracerebral hemorrhage) (HCC) - L frontal due to HTN vs CAA   Aphasia following nontraumatic intracerebral hemorrhage   Gait disturbance, post-stroke     Functional Problem List: Nursing Bladder, Edema, Bowel, Endurance, Medication Management, Perception, Safety  PT Balance, Edema, Endurance, Motor, Pain, Perception, Safety, Sensory  OT Balance, Cognition, Endurance, Motor, Pain, Perception, Safety, Sensory  SLP Behavior, Cognition, Linguistic, Safety  TR         Basic ADL's: OT Grooming, Bathing, Dressing, Toileting     Advanced  ADL's: OT Simple Meal Preparation, Laundry     Transfers: PT Bed Mobility, Bed to Chair, Musician, Manufacturing systems engineer, Metallurgist: PT Stairs, Ambulation, Emergency planning/management officer     Additional Impairments: OT None  SLP Communication, Social Cognition comprehension, expression Social Interaction, Problem Solving, Memory, Attention, Awareness  TR      Anticipated Outcomes Item Anticipated Outcome  Self Feeding n/a  Swallowing  mod I regular diet   Basic self-care  supervision overall  Toileting  supervision overall   Bathroom Transfers supervision overall  Bowel/Bladder  Min assist  Transfers  supervision  Locomotion  supervision, min assist on stairs  Communication  mod I for basic  Cognition  supervision  Pain  < 3  Safety/Judgment  Supervision   Therapy Plan: PT Intensity: Minimum of 1-2 x/day ,45 to 90 minutes PT Frequency: 5 out of 7 days PT Duration Estimated Length of Stay: 21-24 OT Intensity: Minimum of 1-2 x/day, 45 to 90 minutes OT Frequency: 5 out of 7 days OT Duration/Estimated  Length of Stay: 21-24 days SLP Intensity: Minumum of 1-2 x/day, 30 to 90 minutes SLP Frequency: 3 to 5 out of 7 days SLP Duration/Estimated Length of Stay: 21-24 days       Team Interventions: Nursing Interventions Patient/Family Education, Bladder Management, Bowel Management, Disease Management/Prevention, Cognitive Remediation/Compensation, Medication Management, Discharge Planning  PT interventions Ambulation/gait training, Pain management, Stair training, Wheelchair propulsion/positioning, Therapeutic Activities, Patient/family education, DME/adaptive equipment instruction, Training and development officer, Cognitive remediation/compensation, Psychosocial support, Therapeutic Exercise, UE/LE Strength taining/ROM, Functional mobility training, Community reintegration, Discharge planning, Neuromuscular re-education, Splinting/orthotics, UE/LE Coordination activities  OT Interventions Training and development officer, Cognitive remediation/compensation, Community reintegration, Discharge planning, Pain management, UE/LE Coordination activities, Functional mobility training, Self Care/advanced ADL retraining, UE/LE Strength taining/ROM, Therapeutic Exercise, Psychosocial support, DME/adaptive equipment instruction, Patient/family education, Therapeutic Activities  SLP Interventions Cognitive remediation/compensation, Multimodal communication approach, Speech/Language facilitation, Functional tasks, Patient/family education, Dysphagia/aspiration precaution training, Cueing hierarchy, Therapeutic Activities  TR Interventions    SW/CM Interventions Discharge Planning, Psychosocial Support, Patient/Family Education    Team Discharge Planning: Destination: PT-Home ,OT- Home , SLP-Home Projected Follow-up: PT-Home health PT, 24 hour supervision/assistance, OT-  Home health OT, SLP-Outpatient SLP Projected Equipment Needs: PT-To be determined, OT- To be determined, SLP-None recommended by SLP Equipment Details:  PT-likely will need to use RW at d/c, may need w/c , OT-  Patient/family involved in discharge planning: PT- Patient,  OT-Patient, SLP-Patient unable/family or caregive not available  MD ELOS: 21-24 days Medical Rehab Prognosis:  Excellent Assessment: The patient has been admitted for CIR therapies with the diagnosis of left frontal hemorrhage. The team will be addressing functional mobility, strength, stamina, balance, safety, adaptive techniques and equipment, self-care, bowel and bladder  mgt, patient and caregiver education, NMR, visual-spatial awareness, cognitive perceptual awareness, coping skills, communication, community reintegration. Goals have been set at supervision to min assist with mobility and self-care activities and mod I to supervision for cognition and communication.    Meredith Staggers, MD, FAAPMR      See Team Conference Notes for weekly updates to the plan of care

## 2015-08-27 NOTE — Evaluation (Signed)
Physical Therapy Assessment and Plan  Patient Details  Name: David [name] MRN: [id number] Date of Birth: [birth year]  PT Diagnosis: Abnormality of gait, Ataxia, Cognitive deficits and Coordination disorder Rehab Potential: Fair ELOS: 21-24   Today's Date: 08/27/2015 PT Individual Time: 0900-1000 PT Individual Time Calculation (min): 60 min    Problem List:  Patient Active Problem List   Diagnosis Date Noted  . Hypertensive emergency 08/26/2015  . Hyperlipidemia 08/26/2015  . Obesity 08/26/2015  . Lung mass 08/26/2015  . Incontinence 08/26/2015  . Hemorrhagic stroke ([place]) 08/26/2015  . Hemiplegia and hemiparesis following nontraumatic intracerebral hemorrhage affecting right dominant side ([place]) 08/26/2015  . Aphasia following nontraumatic intracerebral hemorrhage 08/26/2015  . Gait disturbance, post-stroke 08/26/2015  . Benign essential HTN   . OSA (obstructive sleep apnea)   . CKD (chronic kidney disease)   . Pacemaker   . ICH (intracerebral hemorrhage) ([facility]) - L frontal due to HTN vs CAA 08/22/2015    Past Medical History:  Past Medical History  Diagnosis Date  . Hypertension   . Sleep apnea     uses CPAP  . High cholesterol   . Renal disorder   . Pacemaker     due to syncope  . Rectal bleed     due to NSAIDS   Past Surgical History:  Past Surgical History  Procedure Laterality Date  . Cardiac surgery      bypass X 2  . Knee arthroscopy  12/2008  . Foot surgery  08/2010    Assessment & Plan Clinical Impression: A 75 y.o. male with history of HTN, OSA, CKD, PPM who was admitted on 08/22/15 with expressive difficulty. He was found to have left frontal hemorrhage 5.7 cm x 4.5 cm with 6 mm left to right shift.  Blood pressure 176/111 at admission and he was started on Cardene drip. Carotid dopplers without significant ICA stenosis. TEE with EF 65-70% with grade one diastolic dysfunction. Follow up CTA head showed stable large left frontal hematoma without vascular  malformations. Speech therapy evaluation done showing global aphasia with receptive > expressive deficits including dysfluency, paraphasias and dysnomia. Dr. Erlinda Hong felt that bleed due to uncontrolled HTN v/s CAA.   Lisinopril titrated for better control.  Incidental note of LLL mass 3.8 X 3.0 cm noted--CT with contrast recommended once renal satus improves.  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 Wife with memory problems but his sister lives close by. PT/OT evaluation showed cognitive deficits and processing affecting mobility and ability to carry out ADL tasks. CIR recommended by rehab team.   Patient transferred to CIR on 08/26/2015 .   Patient currently requires mod with mobility secondary to muscle weakness, decreased endurance, impaired timing and sequencing, unbalanced muscle activation, motor apraxia, ataxia, decreased coordination and decreased motor planning, decreased motor planning and ideational apraxia, decreased initiation, decreased awareness, decreased problem solving and decreased memory and decreased sitting balance, decreased standing balance, decreased postural control and decreased balance strategies.  Prior to hospitalization, patient was modified independent  with mobility and lived with Spouse in a House home.  Home access is 3Stairs to enter.  Patient will benefit from skilled PT intervention to maximize safe functional mobility, minimize fall risk and decrease caregiver burden for planned discharge home with 24 hour supervision.  Anticipate patient will benefit from follow up [place] at discharge.  PT - End of Session Activity Tolerance: Tolerates 30+ min activity with multiple rests Endurance Deficit: Yes Endurance Deficit Description: rest  breaks secondary to fatigue PT Assessment Rehab Potential (ACUTE/IP ONLY): Fair Barriers to Discharge: Inaccessible home environment;Decreased caregiver support PT Patient demonstrates impairments in  the following area(s): Balance;Edema;Endurance;Motor;Pain;Perception;Safety;Sensory PT Transfers Functional Problem(s): Bed Mobility;Bed to Chair;Car;Furniture PT Locomotion Functional Problem(s): Stairs;Ambulation;Wheelchair Mobility PT Plan PT Intensity: Minimum of 1-2 x/day ,45 to 90 minutes PT Frequency: 5 out of 7 days PT Duration Estimated Length of Stay: 21-24 PT Treatment/Interventions: Ambulation/gait training;Pain management;Stair training;Wheelchair propulsion/positioning;Therapeutic Activities;Patient/family education;DME/adaptive equipment instruction;Balance/vestibular training;Cognitive remediation/compensation;Psychosocial support;Therapeutic Exercise;UE/LE Strength taining/ROM;Functional mobility training;Community reintegration;Discharge planning;Neuromuscular re-education;Splinting/orthotics;UE/LE Coordination activities PT Transfers Anticipated Outcome(s): supervision PT Locomotion Anticipated Outcome(s): supervision, min assist on stairs PT Recommendation Follow Up Recommendations: Home health PT;24 hour supervision/assistance Patient destination: Home Equipment Recommended: To be determined Equipment Details: likely will need to use RW at d/c, may need w/c   Skilled Therapeutic Intervention Pt received resting upright in w/c with no c/o pain (but grimaces with L knee/ankle movement), agreeable to therapy.  Pt globally aphasic and presents with apraxia which affect his ability to carry out functional mobility without assistance.  PT provided education on role of PT, goals of therapy, plan of care, and reviewed safety plan.  Pt correctly able to recall use of call bell for assistance back to bed.   Pt's mobility fluctuates from steady assist to mod assist due to a likely combination of weakness, pain, and apraxia.  Gait training x20' with initial steady assist fade to mod assist with pt fatigue and worsening forward trunk flexion.  Pt returned to room in w/c at end of session and  left upright with call bell in reach and needs met.   PT Evaluation Precautions/Restrictions Precautions Precautions: Fall Precaution Comments: L ankle edema and pain with AROM and weight bearing Required Braces or Orthoses: Other Brace/Splint Other Brace/Splint: ASO  Restrictions Weight Bearing Restrictions: No Home Living/Prior Functioning Home Living Available Help at Discharge: Family Type of Home: House Home Access: Stairs to enter Technical brewer of Steps: 3 Entrance Stairs-Rails: None Home Layout: Able to live on main level with bedroom/bathroom;Two level Bathroom Shower/Tub: Walk-in shower;Door Bathroom Accessibility: Yes  Lives With: Spouse Prior Function Level of Independence: Independent with gait;Independent with transfers  Able to Take Stairs?: Yes Driving: Yes Vocation: Retired Biomedical scientist: retired Pharmacist, community Comments: retired Pharmacist, community Vision/Perception  Vision - Risk analyst: Within Passenger transport manager Range of Motion: Within Functional Limits Alignment/Gaze Preference: Within Defined Limits Tracking/Visual Pursuits: Able to track stimulus in all quads without difficulty Saccades: Within functional limits Convergence: Within functional limits Additional Comments: difficult to formally assess secondary to aphasia and cognition  Cognition Overall Cognitive Status: Impaired/Different from baseline Arousal/Alertness: Awake/alert Orientation Level: Oriented to person (difficult to assess 2/2 global aphasia) Attention: Sustained Sustained Attention: Appears intact Memory:  (appears intact but unable to formally assess) Awareness: Impaired Awareness Impairment: Anticipatory impairment;Emergent impairment Problem Solving: Impaired Behaviors: Perseveration Safety/Judgment: Impaired Sensation Sensation Light Touch: Appears Intact Stereognosis: Not tested Hot/Cold: Appears Intact Proprioception: Impaired Detail Proprioception  Impaired Details: Impaired LLE;Impaired LUE Additional Comments: decreased proprioception in L foot and thumb, unable to assess ankle/knee due to pain Coordination Gross Motor Movements are Fluid and Coordinated: No Fine Motor Movements are Fluid and Coordinated: No Finger Nose Finger Test: normal, R=L Heel Shin Test: severely impaired ? due to pain, apraxia, coordination disorder, or combination Motor  Motor Motor: Ataxia;Motor apraxia;Abnormal postural alignment and control  Mobility Transfers Transfers: Yes Sit to Stand: 3: Mod assist Sit to Stand Details: Tactile cues for posture;Tactile cues for weight shifting;Verbal cues  for technique;Visual cues/gestures for sequencing;Visual cues for safe use of DME/AE Sit to Stand Details (indicate cue type and reason): some ideomotor apraxia noted as sit<>stand fluctuates from supervision to mod assist Stand to Sit: 3: Mod assist Stand to Sit Details (indicate cue type and reason): Visual cues for safe use of DME/AE;Tactile cues for weight shifting;Verbal cues for technique;Tactile cues for posture;Visual cues/gestures for sequencing Locomotion  Ambulation Ambulation: Yes Ambulation/Gait Assistance: 3: Mod assist Ambulation Distance (Feet): 20 Feet Assistive device: Rolling walker Ambulation/Gait Assistance Details: Tactile cues for placement;Tactile cues for posture;Manual facilitation for weight shifting;Verbal cues for safe use of DME/AE;Visual cues/gestures for sequencing;Visual cues for safe use of DME/AE Ambulation/Gait Assistance Details: forward flexed trunk leads to increased forward momentum requiring verbal/tactile cues for upright posture and L/R LE progression Gait Gait: Yes Gait Pattern: Impaired Gait Pattern: Step-through pattern;Decreased step length - left;Decreased step length - right;Trunk flexed Gait velocity: decreased Stairs / Additional Locomotion Stairs: No Architect: Yes Wheelchair  Assistance: 5: Investment banker, operational Details: Verbal cues for Marketing executive: Both upper extremities Wheelchair Parts Management: Needs assistance Distance: 120  Trunk/Postural Assessment  Cervical Assessment Cervical Assessment: Within Water engineer Thoracic Assessment: Within Functional Limits Lumbar Assessment Lumbar Assessment: Within Functional Limits Postural Control Postural Control: Deficits on evaluation Righting Reactions: delayed Protective Responses: delayed  Balance Balance Balance Assessed: Yes Static Standing Balance Static Standing - Balance Support: Bilateral upper extremity supported Static Standing - Level of Assistance: 4: Min assist Dynamic Standing Balance Dynamic Standing - Balance Support: During functional activity;No upper extremity supported;Left upper extremity supported;Right upper extremity supported Dynamic Standing - Level of Assistance: 3: Mod assist Extremity Assessment  RUE Assessment RUE Assessment: Within Functional Limits LUE Assessment LUE Assessment: Within Functional Limits RLE Assessment RLE Assessment: Exceptions to [facility] RLE AROM (degrees) RLE Overall AROM Comments: WFL asssessed in sitting RLE Strength Right Hip Flexion: 4-/5 Right Knee Flexion: 4/5 Right Knee Extension: 4-/5 Right Ankle Dorsiflexion: 3+/5 Right Ankle Plantar Flexion: 3+/5 LLE Assessment LLE Assessment: Exceptions to WFL LLE AROM (degrees) LLE Overall AROM Comments: WFL assessed in sitting LLE Strength Left Hip Flexion: 4/5 Left Knee Flexion: 3-/5 Left Knee Extension: 3-/5 Left Ankle Dorsiflexion: 3-/5 (painful) Left Ankle Plantar Flexion: 3-/5 (painful)   See Function Navigator for Current Functional Status.   Refer to Care Plan for Long Term Goals  Recommendations for other services: None  Discharge Criteria: Patient will be discharged from PT if patient refuses treatment 3 consecutive times  without medical reason, if treatment goals not met, if there is a change in medical status, if patient makes no progress towards goals or if patient is discharged from hospital.  The above assessment, treatment plan, treatment alternatives and goals were discussed and mutually agreed upon: by patient  Urban Gibson E Penven-Crew 08/27/2015, 11:40 AM

## 2015-08-27 NOTE — Progress Notes (Signed)
Speech Language Pathology Daily Session Note  Patient Details  Name: David [name] MRN: [id number] Date of Birth: May 25, 1940  Today's Date: 08/27/2015 SLP Individual Time: 1330-1400 SLP Individual Time Calculation (min): 30 min  Short Term Goals: Week 1: SLP Short Term Goal 1 (Week 1): Pt demonstrate >75% acc with Y/N?s with min A. SLP Short Term Goal 2 (Week 1): Pt demonstrate single word reading comprehension with min A. SLP Short Term Goal 3 (Week 1): Pt demonstrate object naming >75% with mod A. SLP Short Term Goal 4 (Week 1): Pt communicate basic needs/wants with mod A. SLP Short Term Goal 5 (Week 1): Pt demonstrate O x4 with mod A. SLP Short Term Goal 6 (Week 1): Pt tolerate regular diet at mod I.  Skilled Therapeutic Interventions: Skilled treatment session focused on language goals. SLP facilitated session by providing max multi-modal demonstration of simple yes/no questions. After demonstration, pt was not able to answer any yes/no questions accurately. When pt was given two verbal choices, he was able to independently state correct choice for orientation information (month, day, date, year). He was able to independently demonstrate reading and comprehension at the word level for 15/15. He was able to read at the simple phrase level for 10/10 but was not able to demonstrate comprehension. Pt was able to independently demonstrate use of call bell. Pt was left in bed with all needs within reach. Education provided to his nurse on communication abilities following session. Continue current plan of care.   Function:  Cognition Comprehension Comprehension assist level: Understands basic 25 - 49% of the time/ requires cueing 50 - 75% of the time  Expression   Expression assist level: Expresses basis less than 25% of the time/requires cueing >75% of the time.  Social Interaction Social Interaction assist level: Interacts appropriately 75 - 89% of the time - Needs redirection for  appropriate language or to initiate interaction.  Problem Solving Problem solving assist level: Solves basic less than 25% of the time - needs direction nearly all the time or does not effectively solve problems and may need a restraint for safety  Memory Memory assist level: Recognizes or recalls less than 25% of the time/requires cueing greater than 75% of the time    Pain Pain Assessment Pain Assessment: Faces Faces Pain Scale: Hurts a little bit Pain Intervention(s): Distraction  Therapy/Group: Individual Therapy  Teron Blais 08/27/2015, 4:35 PM

## 2015-08-27 NOTE — Progress Notes (Signed)
75 y.o. male with history of HTN, OSA, CKD, PPM who was admitted on 08/22/15 with expressive difficulty. He was found to have left frontal hemorrhage 5.7 cm x 4.5 cm with 6 mm left to right shift. Blood pressure 176/111 at admission and he was started on Cardene drip. Carotid dopplers without significant ICA stenosis. TEE with EF 65-70% with grade one diastolic dysfunction. Follow up CTA head showed stable large left frontal hematoma without vascular malformations. Speech therapy evaluation done showing global aphasia with receptive > expressive deficits including dysfluency, paraphasias and dysnomia. Dr. Erlinda Hong felt that bleed due to uncontrolled HTN v/s CAA.   Subjective/Complaints: no complaints, remains aphasic but has some short phrase level expressive abilities I discussed patient's ankle pain with OT this morning. Patient does not recall any type of injury to ankle on the left side. Prior ankle surgery on the right side   Review systems limited by aphasia and cognition  Objective: Vital Signs: Blood pressure 139/89, pulse 78, temperature 98.3 F (36.8 C), temperature source Oral, resp. rate 18, height '5\' 9"'$  (1.753 m), SpO2 97 %. Ct Head Wo Contrast  08/26/2015  CLINICAL DATA:  Follow-up for intracranial hemorrhage. EXAM: CT HEAD WITHOUT CONTRAST TECHNIQUE: Contiguous axial images were obtained from the base of the skull through the vertex without intravenous contrast. COMPARISON:  08/23/2015 FINDINGS: Skull and Sinuses:Stable.  No interval or acute finding. Visualized orbits: Bilateral cataract resection and scleral band on the left. No acute finding Brain: Parenchymal hematoma in the left frontal lobe shows no interval enlargement. Maximal dimensions today are 56 x 51 by 31 mm. A rim of vasogenic edema is stable. The hematoma continues to dissect to the left lateral ventricular margin, without intraventricular or subarachnoid extension. Mass effect is similar to previous, midline shift 6-7 mm  anteriorly. No signs of acute infarct. No new site of hemorrhage. IMPRESSION: Left frontal hematoma shows no interval enlargement. Similar local mass effect and vasogenic edema. Electronically Signed   By: Monte Fantasia M.D.   On: 08/26/2015 12:49   Results for orders placed or performed during the hospital encounter of 08/26/15 (from the past 72 hour(s))  Urinalysis, Routine w reflex microscopic (not at [facility])     Status: Abnormal   Collection Time: 08/26/15  6:38 PM  Result Value Ref Range   Color, Urine YELLOW YELLOW   APPearance CLEAR CLEAR   Specific Gravity, Urine 1.017 1.005 - 1.030   pH 5.5 5.0 - 8.0   Glucose, UA NEGATIVE NEGATIVE mg/dL   Hgb urine dipstick MODERATE (A) NEGATIVE   Bilirubin Urine NEGATIVE NEGATIVE   Ketones, ur NEGATIVE NEGATIVE mg/dL   Protein, ur 100 (A) NEGATIVE mg/dL   Nitrite NEGATIVE NEGATIVE   Leukocytes, UA NEGATIVE NEGATIVE  Urine microscopic-add on     Status: Abnormal   Collection Time: 08/26/15  6:38 PM  Result Value Ref Range   Squamous Epithelial / LPF 0-5 (A) NONE SEEN   WBC, UA 0-5 0 - 5 WBC/hpf   RBC / HPF 0-5 0 - 5 RBC/hpf   Bacteria, UA FEW (A) NONE SEEN     HEENT: normal Cardio: RRR and No murmur Resp: CTA B/L and unlabored GI: BS positive and nondistended nontender Extremity:  Pulses positive and No Edema Skin:   Intact Neuro: Confused, Flat, Cranial Nerve II-XII normal, Normal Sensory, Abnormal Motor motor strength is 4 minus in the right deltoid, biceps, triceps, grip, hip flexor, knee extensor, ankle dorsiflex and plantar flexor. 5/5 on the left side in  the same muscle groups., Abnormal FMC Ataxic/ dec FMC and Aphasic Musc/Skel:  Swelling left ankle swelling and pain with range of motion Gen. no acute distress   Assessment/Plan: 1. Functional deficits secondary to Left frontal intracranial hypertensive hemorrhage  which require 3+ hours per day of interdisciplinary therapy in a comprehensive inpatient rehab  setting. Physiatrist is providing close team supervision and 24 hour management of active medical problems listed below. Physiatrist and rehab team continue to assess barriers to discharge/monitor patient progress toward functional and medical goals. FIM:                   Function - Comprehension Comprehension: Auditory  Function - Expression Expression: Verbal        Function - Memory Patient normally able to recall (first 3 days only): That he or she is in a hospital, Current season  Medical Problem List and Plan: 1.  Right hemiparesis, aphasia, apraxia and cognitive deficits secondary to Left frontal intracranial hypertensive hemorrhage, initiate CIR PT, OT SLP today 2.  DVT Prophylaxis/Anticoagulation: Mechanical: Sequential compression devices, below knee Bilateral lower extremities 3. Pain Management: tylenol prn, left ankle pain suspect sprain we'll check x-rays, Aircast for now, weightbearing as tolerated 4. Mood:  Seems frustrated due to expressive/receptive deficits. Team to provide ego support. LCSW to follow for evaluation and support.   5. Neuropsych: This patient is not capable of making decisions on his own behalf. 6. Skin/Wound Care: routine pressure relief measures 7. Fluids/Electrolytes/Nutrition:  Monitor I/O. Check lytes today 8.  CAD s/p CABG: On coreg and crestor 9. HTN: Monitor BP tid. Continue Lisinopril bid and titrate as needed for tighter control.  Filed Vitals:   08/26/15 2041 08/27/15 0448  BP: 135/77 139/89  Pulse: 78 78  Temp:  98.3 F (36.8 C)  Resp: 16 18    10. CKD: BUN/Creatinine 32/1.5 at admission and currently at baseline.   Encourage fluid intake.   11. Depression: Continue zoloft daily.   12. Hypothyroid: Resume supplement.   13. LLL mass: CT chest when as renal status stabilizes.   14. Incontinence: Likely due to cognitive and processing issues--UA essentially neg except for some hematuria   LOS (Days) 1 A FACE TO FACE  EVALUATION WAS PERFORMED  Sharmane Dame E 08/27/2015, 7:49 AM

## 2015-08-27 NOTE — Evaluation (Signed)
Speech Language Pathology Assessment and Plan  Patient Details  Name: David [name] MRN: [id number] Date of Birth: 1940-12-22  SLP Diagnosis: Aphasia;Cognitive Impairments;Speech and Language deficits  Rehab Potential: Excellent ELOS: 21-24 days    Today's Date: 08/27/2015 SLP Individual Time: 1100-1200 SLP Individual Time Calculation (min): 60 min   Problem List:  Patient Active Problem List   Diagnosis Date Noted  . Hypertensive emergency 08/26/2015  . Hyperlipidemia 08/26/2015  . Obesity 08/26/2015  . Lung mass 08/26/2015  . Incontinence 08/26/2015  . Hemorrhagic stroke ([place]) 08/26/2015  . Hemiplegia and hemiparesis following nontraumatic intracerebral hemorrhage affecting right dominant side ([place]) 08/26/2015  . Aphasia following nontraumatic intracerebral hemorrhage 08/26/2015  . Gait disturbance, post-stroke 08/26/2015  . Benign essential HTN   . OSA (obstructive sleep apnea)   . CKD (chronic kidney disease)   . Pacemaker   . ICH (intracerebral hemorrhage) ([facility]) - L frontal due to HTN vs CAA 08/22/2015   Past Medical History:  Past Medical History  Diagnosis Date  . Hypertension   . Sleep apnea     uses CPAP  . High cholesterol   . Renal disorder   . Pacemaker     due to syncope  . Rectal bleed     due to NSAIDS   Past Surgical History:  Past Surgical History  Procedure Laterality Date  . Cardiac surgery      bypass X 2  . Knee arthroscopy  12/2008  . Foot surgery  08/2010    Assessment / Plan / Recommendation Clinical Impression   [name] is a 75 y.o. male with history of HTN, OSA, CKD, PPM who was admitted on 08/22/15 with expressive difficulty. He was found to have left frontal hemorrhage 5.7 cm x 4.5 cm with 6 mm left to right shift. Blood pressure 176/111 at admission and he was started on Cardene drip. Carotid dopplers without significant ICA stenosis. TEE with EF 65-70% with grade one diastolic dysfunction. Follow up CTA head showed stable  large left frontal hematoma without vascular malformations.  Pt participated with speech/language and swallow assessment. Pt's ability to take PO safely is limited by his cognitive impairment- impulsivity, oral holding, etc. Pt demonstrates moderate global aphasia characterized by decreased initiation, phonemic paraphasias and perseveration. Pt would benefit from ongoing SLP services throughout this rehab stay.   Skilled Therapeutic Interventions          Pt was able to follow 1-step directives with 80% acc with increased time and repetition. Picture ID in field of 4, 6/8. Repetition is labored, but possible and pt benefits from visual cueing. Pt able to label letters and read at the single word level with 100% acc, but unable to demonstrate comprehension at the word level. Pt appears to benefit from semantic and visual cueing as well as repetition.   SLP Assessment  Patient will need skilled Speech Lanaguage Pathology Services during CIR admission    Recommendations  SLP Diet Recommendations: Age appropriate regular solids Liquid Administration via: Cup;Straw Medication Administration: Whole meds with liquid Supervision: Intermittent supervision to cue for compensatory strategies Compensations: Minimize environmental distractions;Slow rate;Small sips/bites;Other (Comment) (monitor for pocketing/residue) Postural Changes and/or Swallow Maneuvers: Out of bed for meals;Seated upright 90 degrees Oral Care Recommendations: Oral care QID Patient destination: Home Follow up Recommendations: Outpatient SLP Equipment Recommended: None recommended by SLP    SLP Frequency 3 to 5 out of 7 days   SLP Duration  SLP Intensity  SLP Treatment/Interventions 21-24 days  Minumum of 1-2 x/day,  30 to 90 minutes  Cognitive remediation/compensation;Multimodal communication approach;Speech/Language facilitation;Functional tasks;Patient/family education;Dysphagia/aspiration precaution training;Cueing  hierarchy;Therapeutic Activities    Pain Pain Assessment Pain Assessment: Faces Faces Pain Scale: Hurts a little bit Pain Intervention(s): Distraction  Prior Functioning Cognitive/Linguistic Baseline: Within functional limits  Lives With: Spouse Vocation: Retired Community education officer)  Function:  Eating Eating   Modified Consistency Diet: No Eating Assist Level: Supervision or verbal cues;Helper checks for pocketed food           Cognition Comprehension Comprehension assist level: Understands basic 25 - 49% of the time/ requires cueing 50 - 75% of the time  Expression   Expression assist level: Expresses basis less than 25% of the time/requires cueing >75% of the time.  Social Interaction Social Interaction assist level: Interacts appropriately 75 - 89% of the time - Needs redirection for appropriate language or to initiate interaction.  Problem Solving Problem solving assist level: Solves basic less than 25% of the time - needs direction nearly all the time or does not effectively solve problems and may need a restraint for safety  Memory Memory assist level: Recognizes or recalls less than 25% of the time/requires cueing greater than 75% of the time   Short Term Goals: Week 1: SLP Short Term Goal 1 (Week 1): Pt demonstrate >75% acc with Y/N?s with min A. SLP Short Term Goal 2 (Week 1): Pt demonstrate single word reading comprehension with min A. SLP Short Term Goal 3 (Week 1): Pt demonstrate object naming >75% with mod A. SLP Short Term Goal 4 (Week 1): Pt communicate basic needs/wants with mod A. SLP Short Term Goal 5 (Week 1): Pt demonstrate O x4 with mod A. SLP Short Term Goal 6 (Week 1): Pt tolerate regular diet at mod I.  Refer to Care Plan for Long Term Goals  Recommendations for other services: None  Discharge Criteria: Patient will be discharged from SLP if patient refuses treatment 3 consecutive times without medical reason, if treatment goals not met, if there is a change  in medical status, if patient makes no progress towards goals or if patient is discharged from hospital.  The above assessment, treatment plan, treatment alternatives and goals were discussed and mutually agreed upon: No family available/patient unable  Vinetta Bergamo 08/27/2015, 4:15 PM

## 2015-08-27 NOTE — Progress Notes (Signed)
Orthopedic Tech Progress Note Patient Details:  David [name] 05/27/1940 [id number]  Ortho Devices Type of Ortho Device: ASO Ortho Device/Splint Location: lle Ortho Device/Splint Interventions: Application   [name] 08/27/2015, 9:31 AM

## 2015-08-27 NOTE — Progress Notes (Addendum)
Social Work Patient ID: David [name], male   DOB: 07-04-1940, 75 y.o.   MRN: [id number]   CSW attempted to meet with pt to introduce self and complete assessment, but he was not able to communicate with CSW at the time and no family was present.  CSW will try again to meet with them on 08-30-15.

## 2015-08-27 NOTE — Evaluation (Signed)
Occupational Therapy Assessment and Plan  Patient Details  Name: Omeed Osuna MRN: [id number] Date of Birth: 07/07/1940  OT Diagnosis: abnormal posture, acute pain, apraxia, muscle weakness (generalized) and coordination disorder Rehab Potential: Rehab Potential (ACUTE ONLY): Fair ELOS: 21-24 days   Today's Date: 08/27/2015 OT Individual Time: 0801-0900 OT Individual Time Calculation (min): 59 min     Problem List:  Patient Active Problem List   Diagnosis Date Noted  . Hypertensive emergency 08/26/2015  . Hyperlipidemia 08/26/2015  . Obesity 08/26/2015  . Lung mass 08/26/2015  . Incontinence 08/26/2015  . Hemorrhagic stroke ([place]) 08/26/2015  . Hemiplegia and hemiparesis following nontraumatic intracerebral hemorrhage affecting right dominant side ([place]) 08/26/2015  . Aphasia following nontraumatic intracerebral hemorrhage 08/26/2015  . Gait disturbance, post-stroke 08/26/2015  . Benign essential HTN   . OSA (obstructive sleep apnea)   . CKD (chronic kidney disease)   . Pacemaker   . ICH (intracerebral hemorrhage) (HCC) - L frontal due to HTN vs CAA 08/22/2015    Past Medical History:  Past Medical History  Diagnosis Date  . Hypertension   . Sleep apnea     uses CPAP  . High cholesterol   . Renal disorder   . Pacemaker     due to syncope  . Rectal bleed     due to NSAIDS   Past Surgical History:  Past Surgical History  Procedure Laterality Date  . Cardiac surgery      bypass X 2  . Knee arthroscopy  12/2008  . Foot surgery  08/2010    Assessment & Plan Clinical Impression: Patient is a 75 y.o. year old male with history of HTN, OSA, CKD, PPM who was admitted on 08/22/15 with expressive difficulty. He was found to have left frontal hemorrhage 5.7 cm x 4.5 cm with 6 mm left to right shift. Blood pressure 176/111 at admission and he was started on Cardene drip. Carotid dopplers without significant ICA stenosis. TEE with EF 65-70% with grade one diastolic dysfunction.  Follow up CTA head showed stable large left frontal hematoma without vascular malformations. Speech therapy evaluation done showing global aphasia with receptive > expressive deficits including dysfluency, paraphasias and dysnomia. Dr. Erlinda Hong felt that bleed due to uncontrolled HTN v/s CAA.   Lisinopril titrated for better control. Incidental note of LLL mass 3.8 X 3.0 cm noted--CT with contrast recommended once renal status improves. 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 Wife with memory problems but his sister lives close by. PT/OT evaluation showed cognitive deficits and processing affecting mobility and ability to carry out ADL tasks.   Repeat CT head ordered today due to family's concerns of decline/incontinence-The scan was repeated and showed no changes compared to prior scan on 5/15 .  Patient transferred to CIR on 08/26/2015 .    Patient currently requires mod with basic self-care skills secondary to muscle weakness, decreased cardiorespiratoy endurance, motor apraxia and decreased coordination, decreased awareness, decreased problem solving and decreased safety awareness and decreased sitting balance, decreased standing balance and decreased balance strategies.  Prior to hospitalization, patient could complete ADLs and IADLs with independent .  Patient will benefit from skilled intervention to increase independence with basic self-care skills prior to discharge home with care partner.  Anticipate patient will require 24 hour supervision and follow up home health.  OT - End of Session Activity Tolerance: Decreased this session Endurance Deficit: Yes Endurance Deficit Description: rest breaks secondary to fatigue OT Assessment Rehab Potential (ACUTE  ONLY): Fair OT Patient demonstrates impairments in the following area(s): Balance;Cognition;Endurance;Motor;Pain;Perception;Safety;Sensory OT Basic ADL's Functional Problem(s):  Grooming;Bathing;Dressing;Toileting OT Advanced ADL's Functional Problem(s): Simple Meal Preparation;Laundry OT Transfers Functional Problem(s): Toilet;Tub/Shower OT Additional Impairment(s): None OT Plan OT Intensity: Minimum of 1-2 x/day, 45 to 90 minutes OT Frequency: 5 out of 7 days OT Duration/Estimated Length of Stay: 21-24 days OT Treatment/Interventions: Balance/vestibular training;Cognitive remediation/compensation;Community reintegration;Discharge planning;Pain management;UE/LE Coordination activities;Functional mobility training;Self Care/advanced ADL retraining;UE/LE Strength taining/ROM;Therapeutic Exercise;Psychosocial support;DME/adaptive equipment instruction;Patient/family education;Therapeutic Activities OT Self Feeding Anticipated Outcome(s): n/a OT Basic Self-Care Anticipated Outcome(s): supervision overall OT Toileting Anticipated Outcome(s): supervision overall OT Bathroom Transfers Anticipated Outcome(s): supervision overall OT Recommendation Patient destination: Home Follow Up Recommendations: Home health OT Equipment Recommended: To be determined   Skilled Therapeutic Intervention Upon entering the room, pt supine in bed awaiting therapist with generalized c/o pain "all over". OT noted that pt has swelling on L ankle and MD ordered air cast but states WBAT for mobility this session. Pt agreeable to OT intervention and motivated to shower. Pt required mod multimodal cues for self care tasks this session. Pt ambulated short distance of 10' with RW and min A into bathroom for shower. Pt bathing at shower level with one sit <>stand in order to wash buttocks and peri area with mod lifting assistance to stand. Pt exited the shower and donned hospital gown as clothing from home not available. Pt answered "yes" and "no" questions correctly based on information found in patients chart given by family. Pt remained in seated in wheelchair at end of session with call bell and all  needed items within reach upon exiting the room. When given call bell and asked how pt would call RN for assistance pt pointed to correct button on call bell.   OT Evaluation Precautions/Restrictions  Precautions Precautions: Fall Precaution Comments: L ankle edema noted. MD ordered air cast and states WBAT Pain Pain Assessment Pain Assessment: Faces Faces Pain Scale: Hurts even more Pain Type: Acute pain Pain Location: Generalized Pain Descriptors / Indicators: Grimacing;Guarding Pain Onset: On-going Patients Stated Pain Goal: 2 Pain Intervention(s): Repositioned;Relaxation Home Living/Prior Functioning Home Living Family/patient expects to be discharged to:: Assisted living Living Arrangements: Other (Comment) Available Help at Discharge: Family Type of Home: House Home Access: Stairs to enter Technical brewer of Steps: 3 Entrance Stairs-Rails: None Home Layout: Able to live on main level with bedroom/bathroom, Two level Bathroom Shower/Tub: Gaffer, Door Bathroom Accessibility: Yes  Lives With: Spouse Prior Function Level of Independence: Independent with basic ADLs, Independent with homemaking with ambulation, Independent with gait, Independent with transfers  Able to Take Stairs?: Yes Driving: Yes Vocation: Retired Comments: retired Administrator, arts- History Baseline Vision/History: Wears glasses Wears Glasses: Reading only Patient Visual Report: No change from baseline Vision- Assessment Vision Assessment?: Yes Eye Alignment: Within Functional Limits Ocular Range of Motion: Within Functional Limits Alignment/Gaze Preference: Within Defined Limits Tracking/Visual Pursuits: Able to track stimulus in all quads without difficulty Saccades: Within functional limits Convergence: Within functional limits Visual Fields:  (possible mild deficit in L visual field to be further tested in functional setting) Additional Comments: difficult to  formally assess secondary to aphasia and cognition  Cognition Overall Cognitive Status: Impaired/Different from baseline Arousal/Alertness: Awake/alert Orientation Level: Other(comment) (unable to formally assess secondary to global aphasia) Memory:  (appears intact but unable to formally assess) Attention: Sustained Sustained Attention: Appears intact Awareness: Impaired Awareness Impairment: Anticipatory impairment;Emergent impairment Problem Solving: Impaired Behaviors: Perseveration Safety/Judgment: Impaired Sensation Sensation Light Touch: Appears Intact  Stereognosis: Not tested Hot/Cold: Appears Intact Proprioception: Impaired Detail Proprioception Impaired Details: Impaired LLE;Impaired LUE Additional Comments: decreased proprioception in L foot and thumb, unable to assess ankle/knee due to pain Coordination Gross Motor Movements are Fluid and Coordinated: No Fine Motor Movements are Fluid and Coordinated: No Finger Nose Finger Test: normal, R=L Heel Shin Test: severely impaired ? due to pain, apraxia, coordination disorder, or combination Motor  Motor Motor: Ataxia;Motor apraxia;Abnormal postural alignment and control Mobility  Transfers Sit to Stand: 3: Mod assist Sit to Stand Details: Tactile cues for posture;Tactile cues for weight shifting;Verbal cues for technique;Visual cues/gestures for sequencing;Visual cues for safe use of DME/AE Sit to Stand Details (indicate cue type and reason): some ideomotor apraxia noted as sit<>stand fluctuates from supervision to mod assist Stand to Sit: 3: Mod assist Stand to Sit Details (indicate cue type and reason): Visual cues for safe use of DME/AE;Tactile cues for weight shifting;Verbal cues for technique;Tactile cues for posture;Visual cues/gestures for sequencing  Trunk/Postural Assessment  Cervical Assessment Cervical Assessment: Within Functional Limits Thoracic Assessment Thoracic Assessment: Within Functional  Limits Lumbar Assessment Lumbar Assessment: Within Functional Limits Postural Control Postural Control: Deficits on evaluation Righting Reactions: delayed Protective Responses: delayed  Balance Balance Balance Assessed: Yes Static Standing Balance Static Standing - Balance Support: Bilateral upper extremity supported Static Standing - Level of Assistance: 4: Min assist Dynamic Standing Balance Dynamic Standing - Balance Support: During functional activity;No upper extremity supported;Left upper extremity supported;Right upper extremity supported Dynamic Standing - Level of Assistance: 3: Mod assist Extremity/Trunk Assessment RUE Assessment RUE Assessment: Within Functional Limits LUE Assessment LUE Assessment: Within Functional Limits   See Function Navigator for Current Functional Status.   Refer to Care Plan for Long Term Goals  Recommendations for other services: None  Discharge Criteria: Patient will be discharged from OT if patient refuses treatment 3 consecutive times without medical reason, if treatment goals not met, if there is a change in medical status, if patient makes no progress towards goals or if patient is discharged from hospital.  The above assessment, treatment plan, treatment alternatives and goals were discussed and mutually agreed upon: by patient  [name] 08/27/2015, 8:48 AM

## 2015-08-28 ENCOUNTER — Inpatient Hospital Stay (HOSPITAL_COMMUNITY): Payer: Medicare Other | Admitting: Speech Pathology

## 2015-08-28 ENCOUNTER — Inpatient Hospital Stay (HOSPITAL_COMMUNITY): Payer: Medicare Other

## 2015-08-28 ENCOUNTER — Inpatient Hospital Stay (HOSPITAL_COMMUNITY): Payer: Medicare Other | Admitting: *Deleted

## 2015-08-28 DIAGNOSIS — N183 Chronic kidney disease, stage 3 (moderate): Secondary | ICD-10-CM

## 2015-08-28 LAB — URINALYSIS, ROUTINE W REFLEX MICROSCOPIC
BILIRUBIN URINE: NEGATIVE
Glucose, UA: NEGATIVE mg/dL
KETONES UR: NEGATIVE mg/dL
Leukocytes, UA: NEGATIVE
NITRITE: NEGATIVE
PROTEIN: 30 mg/dL — AB
Specific Gravity, Urine: 1.015 (ref 1.005–1.030)
pH: 5.5 (ref 5.0–8.0)

## 2015-08-28 LAB — BASIC METABOLIC PANEL
Anion gap: 16 — ABNORMAL HIGH (ref 5–15)
Anion gap: 11 (ref 5–15)
BUN: 49 mg/dL — AB (ref 6–20)
BUN: 65 mg/dL — AB (ref 6–20)
CALCIUM: 8.9 mg/dL (ref 8.9–10.3)
CHLORIDE: 96 mmol/L — AB (ref 101–111)
CO2: 21 mmol/L — ABNORMAL LOW (ref 22–32)
CO2: 21 mmol/L — ABNORMAL LOW (ref 22–32)
CREATININE: 1.54 mg/dL — AB (ref 0.61–1.24)
CREATININE: 1.91 mg/dL — AB (ref 0.61–1.24)
Calcium: 9 mg/dL (ref 8.9–10.3)
Chloride: 101 mmol/L (ref 101–111)
GFR calc Af Amer: 38 mL/min — ABNORMAL LOW (ref >60)
GFR, EST AFRICAN AMERICAN: 49 mL/min — AB (ref >60)
GFR, EST NON AFRICAN AMERICAN: 42 mL/min — AB (ref >60)
GFR, EST NON AFRICAN AMERICAN: 32 mL/min — AB (ref >60)
GLUCOSE: 135 mg/dL — AB (ref 65–99)
Glucose, Bld: 97 mg/dL (ref 65–99)
Potassium: 4.2 mmol/L (ref 3.5–5.1)
Potassium: 4.7 mmol/L (ref 3.5–5.1)
SODIUM: 133 mmol/L — AB (ref 135–145)
Sodium: 133 mmol/L — ABNORMAL LOW (ref 135–145)

## 2015-08-28 LAB — CBC
HCT: 41.3 % (ref 39.0–52.0)
Hemoglobin: 14.2 g/dL (ref 13.0–17.0)
MCH: 32.3 pg (ref 26.0–34.0)
MCHC: 34.4 g/dL (ref 30.0–36.0)
MCV: 93.9 fL (ref 78.0–100.0)
PLATELETS: 240 10*3/uL (ref 150–400)
RBC: 4.4 MIL/uL (ref 4.22–5.81)
RDW: 14.9 % (ref 11.5–15.5)
WBC: 7.3 10*3/uL (ref 4.0–10.5)

## 2015-08-28 LAB — URINE MICROSCOPIC-ADD ON: SQUAMOUS EPITHELIAL / LPF: NONE SEEN

## 2015-08-28 MED ORDER — SODIUM CHLORIDE 0.9 % IV SOLN
INTRAVENOUS | Status: DC
  Administered 2015-08-28 – 2015-08-29 (×2): via INTRAVENOUS

## 2015-08-28 MED ORDER — CARVEDILOL 3.125 MG PO TABS: 3.1250 mg | ORAL_TABLET | Freq: Two times a day (BID) | ORAL | Status: DC

## 2015-08-28 MED ORDER — SODIUM CHLORIDE 0.9 % IV BOLUS (SEPSIS)
300.0000 mL | Freq: Once | INTRAVENOUS | Status: AC
  Administered 2015-08-28: 300 mL via INTRAVENOUS

## 2015-08-28 MED ORDER — CARVEDILOL 3.125 MG PO TABS
3.1250 mg | ORAL_TABLET | Freq: Two times a day (BID) | ORAL | Status: DC
  Administered 2015-08-29 – 2015-09-10 (×24): 3.125 mg via ORAL
  Filled 2015-08-28 (×25): qty 1

## 2015-08-28 MED ORDER — CIPROFLOXACIN HCL 500 MG PO TABS
500.0000 mg | ORAL_TABLET | Freq: Two times a day (BID) | ORAL | Status: DC
  Administered 2015-08-28 – 2015-08-30 (×4): 500 mg via ORAL
  Filled 2015-08-28 (×4): qty 1

## 2015-08-28 MED ORDER — DEXTROSE 5 % IV SOLN
1.0000 g | Freq: Once | INTRAVENOUS | Status: AC
  Administered 2015-08-28: 1 g via INTRAVENOUS
  Filled 2015-08-28: qty 10

## 2015-08-28 NOTE — Progress Notes (Signed)
Occupational Therapy Session Note  Patient Details  Name: David [name] MRN: [id number] Date of Birth: 1941/01/28  Today's Date: 08/28/2015 OT Individual Time: 1300-1330 OT Individual Time Calculation (min): 30 min    Short Term Goals: Week 1:  OT Short Term Goal 1 (Week 1): Pt will perform toilet transfer with min A in order to decrease level of assist needed for functional transfers.  OT Short Term Goal 2 (Week 1): Pt will engage in 15 minutes of functional activity with 2 or less rest breaks secondary to fatigue. OT Short Term Goal 3 (Week 1): Pt will perform LB dressing with mod A in order to decrease level of assist with functional tasks.  OT Short Term Goal 4 (Week 1): Pt will perform shower transfer with min A in order to decrease level of assist for functional transfers.   Skilled Therapeutic Interventions/Progress Updates:    OT session focused on functional transfers, standing balance, and activity tolerance. Pt continued to be limited with standing today secondary to LLE pain. Practiced squat pivot transfers w/c<>mat table 5x as alternative technique technique and for core strengthening. Pt completed transfers initially with mod A, progressing to min A. Pt completed clothes pin activity in standing for 1-2 minutes 3x with focus on dynamic standing balance as pt alternated UE support during task. Pt required min A for balance during standing activity. Pt returned to room and left sitting in w/c with all needs in reach.  Therapy Documentation Precautions:  Precautions Precautions: Fall Precaution Comments: L ankle edema and pain with AROM and weight bearing Required Braces or Orthoses: Other Brace/Splint Other Brace/Splint: ASO  Restrictions Weight Bearing Restrictions: No General:   Vital Signs: Therapy Vitals Temp: 97.8 F (36.6 C) Temp Source: Oral Pulse Rate: 75 Resp: 18 BP: 106/70 mmHg Patient Position (if appropriate): Sitting Oxygen Therapy SpO2: 93 % O2  Device: Not Delivered Pain:   ADL:  See Function Navigator for Current Functional Status.   Therapy/Group: Individual Therapy  [name] 08/28/2015, 2:39 PM

## 2015-08-28 NOTE — Progress Notes (Signed)
David [name] is a 74 y.o. male 1940/04/21 [id number]  Subjective:  No new problems. Slept well. Feeling OK.  Objective: Vital signs in last 24 hours: Temp:  [98.6 F (37 C)] 98.6 F (37 C) (05/19 1356) Pulse Rate:  [75-80] 78 (05/20 0603) Resp:  [18] 18 (05/20 0603) BP: (109-122)/(62-78) 120/78 mmHg (05/20 0603) SpO2:  [95 %-97 %] 95 % (05/20 0603) Weight change:  Last BM Date: 08/27/15  Intake/Output from previous day: 05/19 0701 - 05/20 0700 In: 600 [P.O.:600] Out: 750 [Urine:750] Last cbgs: CBG (last 3)  No results for input(s): GLUCAP in the last 72 hours.   Physical Exam General: No apparent distress   HEENT: not dry Lungs: Normal effort. Lungs clear to auscultation, no crackles or wheezes. Cardiovascular: Regular rate and rhythm, no edema Abdomen: S/NT/ND; BS(+) Musculoskeletal:  unchanged Neurological: No new neurological deficits Wounds: N/A    Skin: clear  Aging changes Mental state: Alert, aphasic, cooperative    Lab Results: BMET    Component Value Date/Time   NA 133* 08/28/2015 0624   K 4.2 08/28/2015 0624   CL 96* 08/28/2015 0624   CO2 21* 08/28/2015 0624   GLUCOSE 97 08/28/2015 0624   BUN 49* 08/28/2015 0624   CREATININE 1.54* 08/28/2015 0624   CALCIUM 9.0 08/28/2015 0624   GFRNONAA 42* 08/28/2015 0624   GFRAA 49* 08/28/2015 0624   CBC    Component Value Date/Time   WBC 10.2 08/27/2015 1207   RBC 4.76 08/27/2015 1207   HGB 15.3 08/27/2015 1207   HCT 44.9 08/27/2015 1207   PLT 208 08/27/2015 1207   MCV 94.3 08/27/2015 1207   MCH 32.1 08/27/2015 1207   MCHC 34.1 08/27/2015 1207   RDW 15.1 08/27/2015 1207   LYMPHSABS 0.4* 08/27/2015 1207   MONOABS 1.2* 08/27/2015 1207   EOSABS 0.1 08/27/2015 1207   BASOSABS 0.0 08/27/2015 1207    Studies/Results: Dg Ankle 2 Views Left  08/27/2015  CLINICAL DATA:  Left ankle pain and swelling EXAM: LEFT ANKLE - 2 VIEW COMPARISON:  None. FINDINGS: Two views of the left ankle submitted. Ankle  mortise is preserved. There is avulsed bony fragment adjacent to distal tibia medial malleolus measures about 8.8 mm. This is of indeterminate age. Appears well corticated and could be from prior injury. Clinical correlation is necessary. IMPRESSION: Ankle mortise is preserved. There is avulsed bony fragment adjacent to distal tibia medial malleolus measures about 8.8 mm. This is of indeterminate age. Appears well corticated and could be from prior injury. Clinical correlation is necessary. Electronically Signed   By: Lahoma Crocker M.D.   On: 08/27/2015 13:43   Ct Head Wo Contrast  08/26/2015  CLINICAL DATA:  Follow-up for intracranial hemorrhage. EXAM: CT HEAD WITHOUT CONTRAST TECHNIQUE: Contiguous axial images were obtained from the base of the skull through the vertex without intravenous contrast. COMPARISON:  08/23/2015 FINDINGS: Skull and Sinuses:Stable.  No interval or acute finding. Visualized orbits: Bilateral cataract resection and scleral band on the left. No acute finding Brain: Parenchymal hematoma in the left frontal lobe shows no interval enlargement. Maximal dimensions today are 56 x 51 by 31 mm. A rim of vasogenic edema is stable. The hematoma continues to dissect to the left lateral ventricular margin, without intraventricular or subarachnoid extension. Mass effect is similar to previous, midline shift 6-7 mm anteriorly. No signs of acute infarct. No new site of hemorrhage. IMPRESSION: Left frontal hematoma shows no interval enlargement. Similar local mass effect and vasogenic edema. Electronically Signed  By: Monte Fantasia M.D.   On: 08/26/2015 12:49    Medications: I have reviewed the patient's current medications.  Assessment/Plan:  1. Right hemiparesis, aphasia, apraxia and cognitive deficits secondary to Left frontal intracranial hypertensive hemorrhage - CIR PT, OT SLP  2. DVT Prophylaxis/Anticoagulation: Mechanical: Sequential compression devices, below knee Bilateral lower  extremities 3. Pain Management: tylenol prn, left ankle pain suspect sprain we'll check x-rays, Aircast for now, weightbearing as tolerated 4. Mood: Seems frustrated due to expressive/receptive deficits. Team to provide ego support. LCSW to follow for evaluation and support.  5. Neuropsych: This patient is not capable of making decisions on his own behalf. 6. Skin/Wound Care: routine pressure relief measures 7. Fluids/Electrolytes/Nutrition: Monitor I/O. Check lytes today 8. CAD s/p CABG: On coreg and crestor 9. HTN: Monitor BP tid. Continue Lisinopril bid and titrate as needed for tighter control.  10. CKD: BUN/Creatinine 32/1.5 at admission and currently at baseline. Encourage fluid intake.  11. Depression: Continue zoloft daily.  12. Hypothyroid: Resume supplement.  13. LLL mass: CT chest when as renal status stabilizes.  14. Incontinence: Likely due to cognitive and processing issues         Length of stay, days: 2  Walker Kehr , MD 08/28/2015, 8:57 AM

## 2015-08-28 NOTE — Progress Notes (Signed)
08/27/17 1915 nursing Daughter called RN re: pt getting confused. Some confusion noted when RN went to check patient ;  V/S: T 100 120/60 78 94%RA; RN noted urine has a smell called MD New orders noted; RN called back daughter to update patient's condition. Daughter came to check patient. RN updated her again. Report given to incoming RN. We will continue to monitor.       Marland Kitchen..

## 2015-08-28 NOTE — Procedures (Signed)
Placed patient on CPAP for the night.  Patient is tolerating well at this time. 

## 2015-08-28 NOTE — Progress Notes (Signed)
Physical Therapy Session Note  Patient Details  Name: David [name] MRN: [id number] Date of Birth: 07-06-1940  Today's Date: 08/28/2015 PT Individual Time: 0715-0800 and 1435-1450 PT Individual Time Calculation (min): 45 min and 14mn   Short Term Goals: Week 1:  PT Short Term Goal 1 (Week 1): Pt will transfer with consistent min assist and LRAD PT Short Term Goal 2 (Week 1): Pt will amb 50' with min assist and LRAD PT Short Term Goal 3 (Week 1): Pt will propel w/c 150' with supervision PT Short Term Goal 4 (Week 1): Pt will negotiate 4 steps with 1 rail and min assist  Skilled Therapeutic Interventions/Progress Updates:  Tx focused on functional mobility training, gait with RW, and NMR via cognitive remediation, forced use, manual facilitation, and multi-modal cues. Pt sleeping hard upon arrival, but able to wake and engage easily. Pt indicating being bothered by OA pain this morning, "a little bit all over, but mostly the leg." Modified tx and offered pain relief from RN, but he declined.  Pt engaged in expressive tasks throughout with good success after multiple attempts, increased processing time, and cues. L ankle support brace donned.   Bed mobility with Min A for initiation  Transfer training for sit<>stands and squat-pivot transfers with solid Mod A for lifting and steadying. Demonstration instruction 5xSTS 33 sec from ~28" elevated surface to RW with Min A, score below age-norm for safety and function.   Balance training for static and dynamic tasks EOB and at sink with Mod A overall due multi-directional instability.  - static standing without UE support x15m - reaching in/outside BOS in all directions - head/trunk rotations - marching at RW x20 with challenge to count aloud Seated NMR in sitting for trunk control and tasks as well as marching  WC propulsion x100' with S and cues for efficient propulsion technique.  Gait training with RW x75' with up to Mod A  Second tx  included car [facility] for lifting and lowering as well as gait in controlled setting x75' with Mod A for turns. Pt limited by fatigue, unable to safely complete stairs today.  Pt left with RN to toilet due to bladder incontinence.          Therapy Documentation Precautions:  Precautions Precautions: Fall Precaution Comments: L ankle edema and pain with AROM and weight bearing Required Braces or Orthoses: Other Brace/Splint Other Brace/Splint: ASO  Restrictions Weight Bearing Restrictions: No General:   Vital Signs: Therapy Vitals Pulse Rate: 78 Resp: 18 BP: 120/78 mmHg Oxygen Therapy SpO2: 95 % O2 Device: Not Delivered Pain: + but unable to give number rating. Modified tx throughout.     See Function Navigator for Current Functional Status.   Therapy/Group: Individual Therapy CoKennieth RadPT, DPT  KADwaine DeterCOFowler/20/2017, 7:08 AM

## 2015-08-28 NOTE — Progress Notes (Signed)
.  Patient ID: David [name], male   DOB: 1941/03/23, 75 y.o.   MRN: [id number]  08/28/15  75 year old patient admitted with left frontal hemorrhagic stroke.  Patient admitted for CIR with aphasia and right hemiparesis.  Medical problems include essential hypertension and mild chronic kidney disease. Patient has become slightly more confused throughout the day with intermittent hypotension and intermittently febrile.  Family states his by mouth intake has been good but laboratory studies revealed increasing BUN/creatinine.  Patient has been incontinent and there is been no recent recorded urine output.  Staff has reported urine to be foul-smelling and dark.  Daughter reports that he has been voiding poorly since his hospital admission  Past Medical History  Diagnosis Date  . Hypertension   . Sleep apnea     uses CPAP  . High cholesterol   . Renal disorder   . Pacemaker     due to syncope  . Rectal bleed     due to NSAIDS    Patient Vitals for the past 24 hrs:  BP Temp Temp src Pulse Resp SpO2  08/28/15 2054 (!) 90/50 mmHg 98.3 F (36.8 C) Oral 78 17 94 %  08/28/15 1910 120/60 mmHg 100 F (37.8 C) Oral 78 18 94 %  08/28/15 1404 106/70 mmHg 97.8 F (36.6 C) Oral 75 18 93 %  08/28/15 0603 120/78 mmHg - - 78 18 95 %    Intake/Output Summary (Last 24 hours) at 08/28/15 2203 Last data filed at 08/28/15 1404  Gross per 24 hour  Intake    420 ml  Output      0 ml  Net    420 ml    General- alert, appears weak with expressive aphasia HEENT- ENT unremarkable; oral mucosa appears well-hydrated Neck supple.  No adenopathy Chest clear CV- regular.  No tachycardia Abdomen-soft, no tenderness.  Bowel sounds active Genitourinary- condom cath with no urine output Extremities- SCDs  Noted; left ankle brace; no calf tenderness or swelling.  Negative Homans Neuro- expressive aphasia with right-sided weakness  Impression- intermittent fever and hypotension in the setting of acute on chronic  renal insufficiency.  A Foley catheter will be placed to rule out possible bladder outlet obstruction and to monitor urine output.  Patient will be placed at bed rest and given supplemental IV fluids.  A urine culture and urinalysis will be repeated and the patient placed on empiric antibiotic therapy.  Follow-up laboratory studies in the morning.  Lisinopril  discontinued and Coreg dose down titrated. Depending on clinical response will be considered for chest x-ray and lower extremity venous Doppler studies tomorrow.  Discussed with  daughter (OBGYN MD)  and family who wished patient to stay in the rehabilitation unit if possible.  We'll transfer to acute care setting if any deterioration.

## 2015-08-28 NOTE — Progress Notes (Signed)
Speech Language Pathology Daily Session Note  Patient Details  Name: David [name] MRN: [id number] Date of Birth: 1940-06-07  Today's Date: 08/28/2015 SLP Individual Time: 6950-7225 SLP Individual Time Calculation (min): 45 min  Short Term Goals: Week 1: SLP Short Term Goal 1 (Week 1): Pt demonstrate >75% acc with Y/N?s with min A. SLP Short Term Goal 2 (Week 1): Pt demonstrate single word reading comprehension with min A. SLP Short Term Goal 3 (Week 1): Pt demonstrate object naming >75% with mod A. SLP Short Term Goal 4 (Week 1): Pt communicate basic needs/wants with mod A. SLP Short Term Goal 5 (Week 1): Pt demonstrate O x4 with mod A. SLP Short Term Goal 6 (Week 1): Pt tolerate regular diet at mod I.  Skilled Therapeutic Interventions:  Pt was seen for skilled ST targeting communication goals.  Pt followed 1 and 2-step simple commands in a functional context with min assist verbal and visual cues.  Pt was able to answer simple, immediate environmental yes/no questions for 100% accuracy with min assist.  Pt required mod assist sentence completion cues to tell therapist his name and birthday.  Pt was able to name 1 out of 10 familiar objects independently which improved to 70% accuracy with mod assist verbal cues for sentence completion and phonemic placement.  Pt aware of verbal errors but requires mod assist verbal and visual cues to correct verbal errors in the moment due to perseveration and echolalia.  Pt left in wheelchair with call bell within reach and quick release belt donned.  Continue per current plan of care.    Function:  Eating Eating                 Cognition Comprehension Comprehension assist level: Understands basic 50 - 74% of the time/ requires cueing 25 - 49% of the time  Expression   Expression assist level: Expresses basic 25 - 49% of the time/requires cueing 50 - 75% of the time. Uses single words/gestures.  Social Interaction Social Interaction assist  level: Interacts appropriately 75 - 89% of the time - Needs redirection for appropriate language or to initiate interaction.  Problem Solving Problem solving assist level: Solves basic 50 - 74% of the time/requires cueing 25 - 49% of the time  Memory Memory assist level: Recognizes or recalls less than 25% of the time/requires cueing greater than 75% of the time    Pain Pain Assessment Pain Assessment: Faces Faces Pain Scale: No hurt  Therapy/Group: Individual Therapy  Ruchi Stoney, Selinda Orion 08/28/2015, 8:42 AM

## 2015-08-28 NOTE — Progress Notes (Signed)
NT assisted patient up to stand to attempt to void but unsuccessful and assisted back to bed. Checked pt VS at 2055 B/P 90/50, T 98.3, HR 78, RR 17, pt reports feeling dizzy. Daughter at the bedside and concerned with patient status and complaints. MD on call notified and order received. MD arrived to unit shortly after to assess patient. IVF started and indwelling foley catheter inserted at 2230 with minimal resistance. Initial urine output of 825 cc. Family left shortly after IVF and foley was placed. Will cont to monitor pt.  Kenita Bines, Dione Plover

## 2015-08-28 NOTE — Progress Notes (Signed)
Occupational Therapy Session Note  Patient Details  Name: David [name] MRN: [id number] Date of Birth: May 13, 1940  Today's Date: 08/28/2015 OT Individual Time: 0900-1000 OT Individual Time Calculation (min): 60 min    Short Term Goals: Week 1:  OT Short Term Goal 1 (Week 1): Pt will perform toilet transfer with min A in order to decrease level of assist needed for functional transfers.  OT Short Term Goal 2 (Week 1): Pt will engage in 15 minutes of functional activity with 2 or less rest breaks secondary to fatigue. OT Short Term Goal 3 (Week 1): Pt will perform LB dressing with mod A in order to decrease level of assist with functional tasks.  OT Short Term Goal 4 (Week 1): Pt will perform shower transfer with min A in order to decrease level of assist for functional transfers.   Skilled Therapeutic Interventions/Progress Updates:    OT session focused on ADL retraining, functional transfers, sit<>stand and standing balance, and activity tolerance. Pt received sitting in w/c agreeable to shower this AM. Attempted to complete sit>stand with RW however pt limited by pain. Completed stand pivot transfer w/c>TTB in walk-in shower with mod A and heavy reliance on grab bar. Pt completed bathing with overall mod A, requiring increased time for buttocks hygiene due to incontinent episode. Pt required moderate multimodal cues for sequencing during bathing. Completed dressing sit<>stand at sink, requiring min-mod A for sit<>stand and min A for static standing balance. Completed sit<>stand 5x, remaining in static standing without UE support for 35-60 seconds with min A overall. Pt required max multimodal cues for use of UE to reach back and facilitate controlled decent from standing position. Pt left sitting in w/c with all needs in reach.   Therapy Documentation Precautions:  Precautions Precautions: Fall Precaution Comments: L ankle edema and pain with AROM and weight bearing Required Braces or  Orthoses: Other Brace/Splint Other Brace/Splint: ASO  Restrictions Weight Bearing Restrictions: No General:   Vital Signs:   Pain: Pain Assessment Pain Assessment: Faces Faces Pain Scale: No hurt ADL:   Exercises:   Other Treatments:    See Function Navigator for Current Functional Status.   Therapy/Group: Individual Therapy  [name], [name] 08/28/2015, 10:05 AM

## 2015-08-29 ENCOUNTER — Inpatient Hospital Stay (HOSPITAL_COMMUNITY): Payer: Medicare Other | Admitting: Occupational Therapy

## 2015-08-29 ENCOUNTER — Inpatient Hospital Stay (HOSPITAL_COMMUNITY): Payer: Medicare Other

## 2015-08-29 DIAGNOSIS — N183 Chronic kidney disease, stage 3 unspecified: Secondary | ICD-10-CM | POA: Insufficient documentation

## 2015-08-29 DIAGNOSIS — R339 Retention of urine, unspecified: Secondary | ICD-10-CM | POA: Insufficient documentation

## 2015-08-29 LAB — COMPREHENSIVE METABOLIC PANEL
ALK PHOS: 46 U/L (ref 38–126)
ALT: 55 U/L (ref 17–63)
ANION GAP: 12 (ref 5–15)
AST: 56 U/L — ABNORMAL HIGH (ref 15–41)
Albumin: 2.4 g/dL — ABNORMAL LOW (ref 3.5–5.0)
BILIRUBIN TOTAL: 0.5 mg/dL (ref 0.3–1.2)
BUN: 62 mg/dL — ABNORMAL HIGH (ref 6–20)
CALCIUM: 8.6 mg/dL — AB (ref 8.9–10.3)
CO2: 21 mmol/L — ABNORMAL LOW (ref 22–32)
Chloride: 100 mmol/L — ABNORMAL LOW (ref 101–111)
Creatinine, Ser: 1.73 mg/dL — ABNORMAL HIGH (ref 0.61–1.24)
GFR calc Af Amer: 42 mL/min — ABNORMAL LOW (ref >60)
GFR, EST NON AFRICAN AMERICAN: 37 mL/min — AB (ref >60)
Glucose, Bld: 105 mg/dL — ABNORMAL HIGH (ref 65–99)
POTASSIUM: 4.2 mmol/L (ref 3.5–5.1)
Sodium: 133 mmol/L — ABNORMAL LOW (ref 135–145)
TOTAL PROTEIN: 6.3 g/dL — AB (ref 6.5–8.1)

## 2015-08-29 LAB — CBC
HEMATOCRIT: 38.7 % — AB (ref 39.0–52.0)
Hemoglobin: 13 g/dL (ref 13.0–17.0)
MCH: 31.1 pg (ref 26.0–34.0)
MCHC: 33.6 g/dL (ref 30.0–36.0)
MCV: 92.6 fL (ref 78.0–100.0)
Platelets: 232 10*3/uL (ref 150–400)
RBC: 4.18 MIL/uL — ABNORMAL LOW (ref 4.22–5.81)
RDW: 15 % (ref 11.5–15.5)
WBC: 6.7 10*3/uL (ref 4.0–10.5)

## 2015-08-29 MED ORDER — ALFUZOSIN HCL ER 10 MG PO TB24
10.0000 mg | ORAL_TABLET | Freq: Every day | ORAL | Status: DC
  Administered 2015-08-29 – 2015-09-10 (×13): 10 mg via ORAL
  Filled 2015-08-29 (×13): qty 1

## 2015-08-29 NOTE — Progress Notes (Signed)
David [name] is a 75 y.o. male 1940-10-04 [id number]  Subjective: No complaints  Per Dr Raliegh Ip:  Last nighta Foley catheter was placed to rule out possible bladder outlet obstruction and to monitor urine output; bed rest and given supplemental IV fluids. A urine culture and urinalysis were repeated and the patient placed on empiric antibiotic therapy. Lisinopril discontinued and Coreg dose down titrated.  Objective: Vital signs in last 24 hours: Temp:  [97.8 F (36.6 C)-100 F (37.8 C)] 98 F (36.7 C) (05/21 0527) Pulse Rate:  [69-79] 79 (05/21 0527) Resp:  [17-18] 18 (05/21 0527) BP: (90-120)/(50-71) 108/71 mmHg (05/21 0527) SpO2:  [93 %-97 %] 95 % (05/21 0527) Weight change:  Last BM Date: 08/27/15  Intake/Output from previous day: 05/20 0701 - 05/21 0700 In: 420 [P.O.:420] Out: 1425 [Urine:1425] Last cbgs: CBG (last 3)  No results for input(s): GLUCAP in the last 72 hours.   Physical Exam General: No apparent distress   HEENT: not dry Lungs: Normal effort. Lungs clear to auscultation, no crackles or wheezes. Cardiovascular: Regular rate and rhythm, no edema Abdomen: S/NT/ND; BS(+) Foley is in Musculoskeletal:  unchanged Neurological: No new neurological deficits Wounds: N/A    Skin: clear  Aging changes Mental state: Alert, aphasic, cooperative    Lab Results: BMET    Component Value Date/Time   NA 133* 08/29/2015 0454   K 4.2 08/29/2015 0454   CL 100* 08/29/2015 0454   CO2 21* 08/29/2015 0454   GLUCOSE 105* 08/29/2015 0454   BUN 62* 08/29/2015 0454   CREATININE 1.73* 08/29/2015 0454   CALCIUM 8.6* 08/29/2015 0454   GFRNONAA 37* 08/29/2015 0454   GFRAA 42* 08/29/2015 0454   CBC    Component Value Date/Time   WBC 6.7 08/29/2015 0454   RBC 4.18* 08/29/2015 0454   HGB 13.0 08/29/2015 0454   HCT 38.7* 08/29/2015 0454   PLT 232 08/29/2015 0454   MCV 92.6 08/29/2015 0454   MCH 31.1 08/29/2015 0454   MCHC 33.6 08/29/2015 0454   RDW 15.0 08/29/2015  0454   LYMPHSABS 0.4* 08/27/2015 1207   MONOABS 1.2* 08/27/2015 1207   EOSABS 0.1 08/27/2015 1207   BASOSABS 0.0 08/27/2015 1207    Studies/Results: Dg Ankle 2 Views Left  08/27/2015  CLINICAL DATA:  Left ankle pain and swelling EXAM: LEFT ANKLE - 2 VIEW COMPARISON:  None. FINDINGS: Two views of the left ankle submitted. Ankle mortise is preserved. There is avulsed bony fragment adjacent to distal tibia medial malleolus measures about 8.8 mm. This is of indeterminate age. Appears well corticated and could be from prior injury. Clinical correlation is necessary. IMPRESSION: Ankle mortise is preserved. There is avulsed bony fragment adjacent to distal tibia medial malleolus measures about 8.8 mm. This is of indeterminate age. Appears well corticated and could be from prior injury. Clinical correlation is necessary. Electronically Signed   By: Lahoma Crocker M.D.   On: 08/27/2015 13:43    Medications: I have reviewed the patient's current medications.  Assessment/Plan:  1. Right hemiparesis, aphasia, apraxia and cognitive deficits secondary to Left frontal intracranial hypertensive hemorrhage - CIR PT, OT SLP  2. DVT Prophylaxis/Anticoagulation: Mechanical: Sequential compression devices, below knee Bilateral lower extremities 3. Pain Management: tylenol prn, left ankle pain suspect sprain we'll check x-rays, Aircast for now, weightbearing as tolerated 4. Mood: Seems frustrated due to expressive/receptive deficits. Team to provide ego support. LCSW to follow for evaluation and support.  5. Neuropsych: This patient is not capable of making decisions on his  own behalf. 6. Skin/Wound Care: routine pressure relief measures 7. Fluids/Electrolytes/Nutrition: IVF. Monitor I/O. Check lytes today 8. CAD s/p CABG: On coreg and crestor 9. HTN: Monitor BP tid. D/c'dLisinopril bid and titrate as needed for tighter control. Coreg reduced. 10. CKD: BUN/Creatinine is being monitored. Encourage fluid  intake.  11. Depression: Continue zoloft daily.  12. Hypothyroid: Resume supplement.  13. LLL mass: CT chest when as renal status stabilizes.  14. Incontinence: Likely due to cognitive and processing issues    15. Urinary retention, low BP: Last nighta Foley catheter was placed to rule out possible bladder outlet obstruction and to monitor urine output; bed rest and given supplemental IV fluids. A urine culture and urinalysis were repeated and the patient placed on empiric antibiotic therapy. Rocephin x 1 dose; Cipro po. Start 16. Low BP: Lisinopril discontinued and Coreg dose down titrated. Rocephin x 1 dose; Cipro po. IVF 17. CRF -- Creat clearance 38: will get renal US 18. BPH: Start Uroxatral 65. OSA on CPAP      Length of stay, days: 3  Walker Kehr , MD 08/29/2015, 8:12 AM

## 2015-08-29 NOTE — Plan of Care (Signed)
Problem: RH BLADDER ELIMINATION Goal: RH STG MANAGE BLADDER WITH ASSISTANCE STG Manage Bladder With min Assistance  Outcome: Not Progressing Patient has acute urinary retention on foley cath

## 2015-08-29 NOTE — Progress Notes (Signed)
Occupational Therapy Session Note  Patient Details  Name: David [name] MRN: [id number] Date of Birth: 02-Feb-1941  Today's Date: 08/29/2015 OT Individual Time:  - 0900-0945  (36 miin)      Short Term Goals: Week 1:  OT Short Term Goal 1 (Week 1): Pt will perform toilet transfer with min A in order to decrease level of assist needed for functional transfers.  OT Short Term Goal 2 (Week 1): Pt will engage in 15 minutes of functional activity with 2 or less rest breaks secondary to fatigue. OT Short Term Goal 3 (Week 1): Pt will perform LB dressing with mod A in order to decrease level of assist with functional tasks.  OT Short Term Goal 4 (Week 1): Pt will perform shower transfer with min A in order to decrease level of assist for functional transfers.  Week 2:     Skilled Therapeutic Interventions/Progress Updates:   Addressed the following: functional transfers, standing balance, dressing techniques and activity tolerance.   Pt lying in bed upon arrival.   He had bouts of confusion last evening with decreased urine output.   Pt off bed rest.    Pt agreed to bath and dress.  .  Pt engaged in rolling for pressure relief.  Pt rolled to right and left with manual assistance and hand placement.  BP=  118/76;  HR= 76, Ox 95.  .  Transferred to wc with mod assist  (stand pivot).  Set pt up in wc for breakfast.    Pt knew day of week, and year with calendar as cue.   . Pt left in room and left sitting in w/c with all needs in reach and eating breakfast.  .  Therapy Documentation Precautions:  Precautions Precautions: Fall Precaution Comments: L ankle edema and pain with AROM and weight bearing Required Braces or Orthoses: Other Brace/Splint Other Brace/Splint: ASO  Restrictions Weight Bearing Restrictions: No    Vital Signs: Therapy Vitals Temp: 98 F (36.7 C) Temp Source: Axillary Pulse Rate: 79 Resp: 18 BP: 108/71 mmHg Patient Position (if appropriate): Lying Oxygen Therapy SpO2:  95 % O2 Device: CPAP Pain:  Left leg 3/10              See Function Navigator for Current Functional Status.   Therapy/Group: Individual Therapy  Lisa Roca 08/29/2015, 7:53 AM

## 2015-08-30 ENCOUNTER — Inpatient Hospital Stay (HOSPITAL_COMMUNITY): Payer: Medicare Other | Admitting: Speech Pathology

## 2015-08-30 ENCOUNTER — Inpatient Hospital Stay (HOSPITAL_COMMUNITY): Payer: Medicare Other | Admitting: Occupational Therapy

## 2015-08-30 ENCOUNTER — Inpatient Hospital Stay (HOSPITAL_COMMUNITY): Payer: Medicare Other | Admitting: Physical Therapy

## 2015-08-30 DIAGNOSIS — M25473 Effusion, unspecified ankle: Secondary | ICD-10-CM | POA: Insufficient documentation

## 2015-08-30 DIAGNOSIS — M25472 Effusion, left ankle: Secondary | ICD-10-CM

## 2015-08-30 DIAGNOSIS — M25475 Effusion, left foot: Secondary | ICD-10-CM

## 2015-08-30 DIAGNOSIS — M25572 Pain in left ankle and joints of left foot: Secondary | ICD-10-CM

## 2015-08-30 DIAGNOSIS — M25579 Pain in unspecified ankle and joints of unspecified foot: Secondary | ICD-10-CM | POA: Insufficient documentation

## 2015-08-30 LAB — URINE CULTURE: Culture: NO GROWTH

## 2015-08-30 LAB — BASIC METABOLIC PANEL
Anion gap: 7 (ref 5–15)
BUN: 52 mg/dL — ABNORMAL HIGH (ref 6–20)
CALCIUM: 8.8 mg/dL — AB (ref 8.9–10.3)
CO2: 23 mmol/L (ref 22–32)
CREATININE: 1.45 mg/dL — AB (ref 0.61–1.24)
Chloride: 106 mmol/L (ref 101–111)
GFR calc Af Amer: 52 mL/min — ABNORMAL LOW (ref >60)
GFR, EST NON AFRICAN AMERICAN: 45 mL/min — AB (ref >60)
GLUCOSE: 100 mg/dL — AB (ref 65–99)
Potassium: 4.7 mmol/L (ref 3.5–5.1)
Sodium: 136 mmol/L (ref 135–145)

## 2015-08-30 LAB — PSA: PSA: 3.9 ng/mL (ref 0.00–4.00)

## 2015-08-30 MED ORDER — SERTRALINE HCL 50 MG PO TABS
25.0000 mg | ORAL_TABLET | Freq: Once | ORAL | Status: AC
  Administered 2015-08-30: 25 mg via ORAL
  Filled 2015-08-30: qty 1

## 2015-08-30 MED ORDER — SERTRALINE HCL 50 MG PO TABS
125.0000 mg | ORAL_TABLET | Freq: Every day | ORAL | Status: DC
  Administered 2015-08-31 – 2015-09-10 (×11): 125 mg via ORAL
  Filled 2015-08-30 (×11): qty 1

## 2015-08-30 NOTE — Progress Notes (Signed)
Subjective/Complaints: Patient sitting up in bed. He is awake. He repeats "okay" when asked questions.   Review systems limited by aphasia and cognition  Objective: Vital Signs: Blood pressure 113/66, pulse 72, temperature 97.9 F (36.6 C), temperature source Oral, resp. rate 18, height _0  (1.753 m), SpO2 98 %. US Renal  08/29/2015  CLINICAL DATA:  75 year old with left frontal hematoma, also with acute superimposed upon chronic renal insufficiency. EXAM: RENAL / URINARY TRACT ULTRASOUND COMPLETE COMPARISON:  None. FINDINGS: Right Kidney: Length: Approximately 11.7 cm. Well-preserved cortex. Echogenic parenchyma. Approximate 2.3 x 1.9 x 1.6 cm anechoic mass with a thin internal septation demonstrating acoustic enhancement and no internal color Doppler flow involving the mid kidney. Approximate 1.1 x 1.0 x 1.3 cm anechoic mass with acoustic enhancement and no internal color Doppler flow involving the upper pole. No solid renal masses. No hydronephrosis. Left Kidney: Length: Approximately 12.1 cm. Well-preserved cortex. Echogenic parenchyma. Approximate 1.3 x 1.3 x 1.1 cm anechoic mass with acoustic enhancement and no internal color Doppler flow involving the upper pole. Approximate 1.2 x 0.9 x 0.6 cm anechoic mass with acoustic enhancement and no internal color Doppler flow involving the mid kidney. No solid renal masses. No hydronephrosis. Bladder: Decompressed by Foley catheter. IMPRESSION: 1. No evidence of hydronephrosis involving either kidney to suggest urinary tract obstruction as a cause of renal insufficiency. 2. Echogenic parenchyma bilaterally indicating medical renal disease. 3. Cortical cysts involving both kidneys. No significant focal parenchymal abnormality involving either kidney. Electronically Signed   By: Evangeline Dakin M.D.   On: 08/29/2015 15:50   Results for orders placed or performed during the hospital encounter of 08/26/15 (from the past 72 hour(s))  CBC with  Differential/Platelet     Status: Abnormal   Collection Time: 08/27/15 12:07 PM  Result Value Ref Range   WBC 10.2 4.0 - 10.5 K/uL   RBC 4.76 4.22 - 5.81 MIL/uL   Hemoglobin 15.3 13.0 - 17.0 g/dL   HCT 44.9 39.0 - 52.0 %   MCV 94.3 78.0 - 100.0 fL   MCH 32.1 26.0 - 34.0 pg   MCHC 34.1 30.0 - 36.0 g/dL   RDW 15.1 11.5 - 15.5 %   Platelets 208 150 - 400 K/uL   Neutrophils Relative % 83 %   Neutro Abs 8.5 (H) 1.7 - 7.7 K/uL   Lymphocytes Relative 4 %   Lymphs Abs 0.4 (L) 0.7 - 4.0 K/uL   Monocytes Relative 12 %   Monocytes Absolute 1.2 (H) 0.1 - 1.0 K/uL   Eosinophils Relative 1 %   Eosinophils Absolute 0.1 0.0 - 0.7 K/uL   Basophils Relative 0 %   Basophils Absolute 0.0 0.0 - 0.1 K/uL  Comprehensive metabolic panel     Status: Abnormal   Collection Time: 08/27/15 12:07 PM  Result Value Ref Range   Sodium 134 (L) 135 - 145 mmol/L   Potassium 4.6 3.5 - 5.1 mmol/L   Chloride 96 (L) 101 - 111 mmol/L   CO2 25 22 - 32 mmol/L   Glucose, Bld 113 (H) 65 - 99 mg/dL   BUN 37 (H) 6 - 20 mg/dL   Creatinine, Ser 1.61 (H) 0.61 - 1.24 mg/dL   Calcium 9.4 8.9 - 10.3 mg/dL   Total Protein 7.7 6.5 - 8.1 g/dL   Albumin 3.2 (L) 3.5 - 5.0 g/dL   AST 28 15 - 41 U/L   ALT 21 17 - 63 U/L   Alkaline Phosphatase 63 38 - 126 U/L  Total Bilirubin 0.9 0.3 - 1.2 mg/dL   GFR calc non Af Amer 40 (L) >60 mL/min   GFR calc Af Amer 46 (L) >60 mL/min    Comment: (NOTE) The eGFR has been calculated using the CKD EPI equation. This calculation has not been validated in all clinical situations. eGFR's persistently <60 mL/min signify possible Chronic Kidney Disease.    Anion gap 13 5 - 15  Basic metabolic panel     Status: Abnormal   Collection Time: 08/28/15  6:24 AM  Result Value Ref Range   Sodium 133 (L) 135 - 145 mmol/L   Potassium 4.2 3.5 - 5.1 mmol/L   Chloride 96 (L) 101 - 111 mmol/L   CO2 21 (L) 22 - 32 mmol/L   Glucose, Bld 97 65 - 99 mg/dL   BUN 49 (H) 6 - 20 mg/dL   Creatinine, Ser 1.54  (H) 0.61 - 1.24 mg/dL   Calcium 9.0 8.9 - 10.3 mg/dL   GFR calc non Af Amer 42 (L) >60 mL/min   GFR calc Af Amer 49 (L) >60 mL/min    Comment: (NOTE) The eGFR has been calculated using the CKD EPI equation. This calculation has not been validated in all clinical situations. eGFR's persistently <60 mL/min signify possible Chronic Kidney Disease.    Anion gap 16 (H) 5 - 15  CBC     Status: None   Collection Time: 08/28/15  8:00 PM  Result Value Ref Range   WBC 7.3 4.0 - 10.5 K/uL   RBC 4.40 4.22 - 5.81 MIL/uL   Hemoglobin 14.2 13.0 - 17.0 g/dL   HCT 41.3 39.0 - 52.0 %   MCV 93.9 78.0 - 100.0 fL   MCH 32.3 26.0 - 34.0 pg   MCHC 34.4 30.0 - 36.0 g/dL   RDW 14.9 11.5 - 15.5 %   Platelets 240 150 - 400 K/uL  Basic metabolic panel     Status: Abnormal   Collection Time: 08/28/15  8:00 PM  Result Value Ref Range   Sodium 133 (L) 135 - 145 mmol/L   Potassium 4.7 3.5 - 5.1 mmol/L   Chloride 101 101 - 111 mmol/L   CO2 21 (L) 22 - 32 mmol/L   Glucose, Bld 135 (H) 65 - 99 mg/dL   BUN 65 (H) 6 - 20 mg/dL   Creatinine, Ser 1.91 (H) 0.61 - 1.24 mg/dL   Calcium 8.9 8.9 - 10.3 mg/dL   GFR calc non Af Amer 32 (L) >60 mL/min   GFR calc Af Amer 38 (L) >60 mL/min    Comment: (NOTE) The eGFR has been calculated using the CKD EPI equation. This calculation has not been validated in all clinical situations. eGFR's persistently <60 mL/min signify possible Chronic Kidney Disease.    Anion gap 11 5 - 15  Urinalysis, Routine w reflex microscopic (not at [facility])     Status: Abnormal   Collection Time: 08/28/15 10:47 PM  Result Value Ref Range   Color, Urine YELLOW YELLOW   APPearance CLOUDY (A) CLEAR   Specific Gravity, Urine 1.015 1.005 - 1.030   pH 5.5 5.0 - 8.0   Glucose, UA NEGATIVE NEGATIVE mg/dL   Hgb urine dipstick SMALL (A) NEGATIVE   Bilirubin Urine NEGATIVE NEGATIVE   Ketones, ur NEGATIVE NEGATIVE mg/dL   Protein, ur 30 (A) NEGATIVE mg/dL   Nitrite NEGATIVE NEGATIVE   Leukocytes, UA  NEGATIVE NEGATIVE  Urine microscopic-add on     Status: Abnormal   Collection Time: 08/28/15  10:47 PM  Result Value Ref Range   Squamous Epithelial / LPF NONE SEEN NONE SEEN   WBC, UA 0-5 0 - 5 WBC/hpf   RBC / HPF 0-5 0 - 5 RBC/hpf   Bacteria, UA RARE (A) NONE SEEN   Casts HYALINE CASTS (A) NEGATIVE   Urine-Other AMORPHOUS URATES/PHOSPHATES   Culture, Urine     Status: None   Collection Time: 08/28/15 10:48 PM  Result Value Ref Range   Specimen Description URINE, CATHETERIZED    Special Requests NONE    Culture NO GROWTH    Report Status 08/30/2015 FINAL   CBC     Status: Abnormal   Collection Time: 08/29/15  4:54 AM  Result Value Ref Range   WBC 6.7 4.0 - 10.5 K/uL   RBC 4.18 (L) 4.22 - 5.81 MIL/uL   Hemoglobin 13.0 13.0 - 17.0 g/dL   HCT 38.7 (L) 39.0 - 52.0 %   MCV 92.6 78.0 - 100.0 fL   MCH 31.1 26.0 - 34.0 pg   MCHC 33.6 30.0 - 36.0 g/dL   RDW 15.0 11.5 - 15.5 %   Platelets 232 150 - 400 K/uL  Comprehensive metabolic panel     Status: Abnormal   Collection Time: 08/29/15  4:54 AM  Result Value Ref Range   Sodium 133 (L) 135 - 145 mmol/L   Potassium 4.2 3.5 - 5.1 mmol/L   Chloride 100 (L) 101 - 111 mmol/L   CO2 21 (L) 22 - 32 mmol/L   Glucose, Bld 105 (H) 65 - 99 mg/dL   BUN 62 (H) 6 - 20 mg/dL   Creatinine, Ser 1.73 (H) 0.61 - 1.24 mg/dL   Calcium 8.6 (L) 8.9 - 10.3 mg/dL   Total Protein 6.3 (L) 6.5 - 8.1 g/dL   Albumin 2.4 (L) 3.5 - 5.0 g/dL   AST 56 (H) 15 - 41 U/L   ALT 55 17 - 63 U/L   Alkaline Phosphatase 46 38 - 126 U/L   Total Bilirubin 0.5 0.3 - 1.2 mg/dL   GFR calc non Af Amer 37 (L) >60 mL/min   GFR calc Af Amer 42 (L) >60 mL/min    Comment: (NOTE) The eGFR has been calculated using the CKD EPI equation. This calculation has not been validated in all clinical situations. eGFR's persistently <60 mL/min signify possible Chronic Kidney Disease.    Anion gap 12 5 - 15  Basic metabolic panel     Status: Abnormal   Collection Time: 08/30/15  8:01 AM   Result Value Ref Range   Sodium 136 135 - 145 mmol/L   Potassium 4.7 3.5 - 5.1 mmol/L   Chloride 106 101 - 111 mmol/L   CO2 23 22 - 32 mmol/L   Glucose, Bld 100 (H) 65 - 99 mg/dL   BUN 52 (H) 6 - 20 mg/dL   Creatinine, Ser 1.45 (H) 0.61 - 1.24 mg/dL   Calcium 8.8 (L) 8.9 - 10.3 mg/dL   GFR calc non Af Amer 45 (L) >60 mL/min   GFR calc Af Amer 52 (L) >60 mL/min    Comment: (NOTE) The eGFR has been calculated using the CKD EPI equation. This calculation has not been validated in all clinical situations. eGFR's persistently <60 mL/min signify possible Chronic Kidney Disease.    Anion gap 7 5 - 15  PSA     Status: None   Collection Time: 08/30/15  8:01 AM  Result Value Ref Range   PSA 3.90 0.00 - 4.00 ng/mL  Comment: (NOTE) While PSA levels of <=4.0 ng/ml are reported as reference range, some men with levels below 4.0 ng/ml can have prostate cancer and many men with PSA above 4.0 ng/ml do not have prostate cancer.  Other tests such as free PSA, age specific reference ranges, PSA velocity and PSA doubling time may be helpful especially in men less than 71 years old.     Gen. no acute distress. Vital signs reviewed. Well-developed, well-nourished. HENT: Normocephalic atraumatic Eyes: Conjunctivae and EOM normal Cardio: RRR and No murmur Resp: CTA B/L and unlabored GI: BS positive and nondistended nontender Musc/Skel:  No edema, no tenderness noted. Neuro:  Global aphasia Alert, confused Able to perform demonstrated commands for the most part Motor: 4/5 in the right deltoid, biceps, triceps, grip, hip flexor, knee extensor, ankle dorsiflex and plantar flexor.  5/5 on the left side in the same muscle groups., Skin:   Intact. Warm and dry.  Assessment/Plan: 1. Functional deficits secondary to Left frontal intracranial hypertensive hemorrhage  which require 3+ hours per day of interdisciplinary therapy in a comprehensive inpatient rehab setting. Physiatrist is providing  close team supervision and 24 hour management of active medical problems listed below. Physiatrist and rehab team continue to assess barriers to discharge/monitor patient progress toward functional and medical goals. FIM: Function - Bathing Position: Sitting EOB Body parts bathed by patient: Right arm, Left arm, Chest, Abdomen, Front perineal area, Right upper leg, Left upper leg Body parts bathed by helper: Buttocks, Right lower leg, Left lower leg, Back Assist Level: Touching or steadying assistance(Pt > 75%)  Function- Upper Body Dressing/Undressing What is the patient wearing?: Pull over shirt/dress Pull over shirt/dress - Perfomed by patient: Thread/unthread right sleeve, Thread/unthread left sleeve, Put head through opening, Pull shirt over trunk Assist Level: Set up Set up : To obtain clothing/put away Function - Lower Body Dressing/Undressing What is the patient wearing?: Pants Position: Sitting EOB Pants- Performed by patient: Thread/unthread left pants leg, Thread/unthread right pants leg, Pull pants up/down, Fasten/unfasten pants Pants- Performed by helper: Pull pants up/down, Thread/unthread right pants leg Non-skid slipper socks- Performed by helper: Don/doff right sock, Don/doff left sock Assist for footwear: Dependant Assist for lower body dressing: Touching or steadying assistance (Pt > 75%)  Function - Toileting Toileting activity did not occur: No continent bowel/bladder event Toileting steps completed by patient:  (gown on)  Function - Air cabin crew transfer activity did not occur: Refused  Function - Chair/bed transfer Chair/bed transfer method: Stand pivot Chair/bed transfer assist level: Touching or steadying assistance (Pt > 75%) Chair/bed transfer assistive device: Armrests, Walker Chair/bed transfer details: Verbal cues for sequencing, Verbal cues for technique, Tactile cues for posture, Tactile cues for weight bearing  Function - Locomotion:  Wheelchair Will patient use wheelchair at discharge?: No Type: Manual Max wheelchair distance: 150 Assist Level: Supervision or verbal cues Assist Level: Supervision or verbal cues Wheel 150 feet activity did not occur: Safety/medical concerns Assist Level: Supervision or verbal cues Turns around,maneuvers to table,bed, and toilet,negotiates 3% grade,maneuvers on rugs and over doorsills: No Function - Locomotion: Ambulation Assistive device: Walker-rolling Max distance: 105 Assist level: Touching or steadying assistance (Pt > 75%) Assist level: Touching or steadying assistance (Pt > 75%) Assist level: Touching or steadying assistance (Pt > 75%) Walk 150 feet activity did not occur: Safety/medical concerns Walk 10 feet on uneven surfaces activity did not occur: Safety/medical concerns  Function - Comprehension Comprehension: Auditory Comprehension assist level: Understands basic 25 - 49% of the time/  requires cueing 50 - 75% of the time  Function - Expression Expression: Verbal Expression assist level: Expresses basis less than 25% of the time/requires cueing >75% of the time.  Function - Social Interaction Social Interaction assist level: Interacts appropriately 75 - 89% of the time - Needs redirection for appropriate language or to initiate interaction.  Function - Problem Solving Problem solving assist level: Solves basic less than 25% of the time - needs direction nearly all the time or does not effectively solve problems and may need a restraint for safety  Function - Memory Memory assist level: Recognizes or recalls less than 25% of the time/requires cueing greater than 75% of the time Patient normally able to recall (first 3 days only): That he or she is in a hospital, Location of own room  Medical Problem List and Plan: 1.  Right hemiparesis, aphasia, apraxia and cognitive deficits secondary to Left frontal intracranial hypertensive hemorrhage  Continue CIR 2.  DVT  Prophylaxis/Anticoagulation: Mechanical: Sequential compression devices, below knee Bilateral lower extremities 3. Pain Management: tylenol prn,   left ankle pain suspect sprain, x-ray reviewed, showing avulsed bony fragment distal tibia medial malleolus of indeterminate age. Continue Aircast, weightbearing as tolerated 4. Mood:  Seems frustrated due to expressive/receptive deficits. Team to provide ego support. LCSW to follow for evaluation and support.   5. Neuropsych: This patient is not capable of making decisions on his own behalf. 6. Skin/Wound Care: routine pressure relief measures 7. Fluids/Electrolytes/Nutrition:  Monitor I/O.   BMP improving on 5/22 8.  CAD s/p CABG: On coreg and crestor 9. Labile BP: Monitor BP. Lisinoprildiscontinued and Coreg dose down titrated.  Improved with IVF, DC'd on 5/22 10. CKD: BUN/Creatinine 32/1.5 at admission and at baseline.     Creatinine 1.5 on 5/22  Will continue to monitor  Renal ultrasound reviewed suggesting medical renal disease, junction  Encourage fluid intake.   11. Depression: Continue zoloft daily.   12. Hypothyroid: Resumed supplement.   13. LLL mass: CT chest when renal status stabilizes.   14. Incontinence: Likely due to cognitive and processing issues--UA essentially neg except for some hematuria 15. Urinary retention  Urine culture negative on 5/21  Renal U/S reviewed suggesting medical renal disease, no obstruction. 16. BPH: Started Uroxatral 17. OSA : Continue CPAP  LOS (Days) 4 A FACE TO FACE EVALUATION WAS PERFORMED  Luisana Lutzke Lorie Phenix 08/30/2015, 10:19 AM

## 2015-08-30 NOTE — Progress Notes (Signed)
Occupational Therapy Session Note  Patient Details  Name: David [name] MRN: [id number] Date of Birth: Aug 18, 1940  Today's Date: 08/30/2015 OT Individual Time: 1638-4536 OT Individual Time Calculation (min): 45 min    Short Term Goals: Week 1:  OT Short Term Goal 1 (Week 1): Pt will perform toilet transfer with min A in order to decrease level of assist needed for functional transfers.  OT Short Term Goal 2 (Week 1): Pt will engage in 15 minutes of functional activity with 2 or less rest breaks secondary to fatigue. OT Short Term Goal 3 (Week 1): Pt will perform LB dressing with mod A in order to decrease level of assist with functional tasks.  OT Short Term Goal 4 (Week 1): Pt will perform shower transfer with min A in order to decrease level of assist for functional transfers.   Skilled Therapeutic Interventions/Progress Updates:    Pt seen for skilled OT to facilitate sit to stand, dynamic balance, communication and cognition skills with ADL retraining. Pt agreeable to a shower. Pt needed steady A with all sit to stands today from w/c and tub bench, ambulated with RW with steady A to shower. Once in standing position, pt stands and reaches to thigh level with supervision. Needs A with bathing feet and donning socks due to low back pain. Pt often confuses his yes/no responses or states "I don't care". Pt tired after B/D. Pt adjusted in w/c with all needs met.    Therapy Documentation Precautions:  Precautions Precautions: Fall Precaution Comments: L ankle edema and pain with AROM and weight bearing Required Braces or Orthoses: Other Brace/Splint Other Brace/Splint: ASO  Restrictions Weight Bearing Restrictions: No      Pain: Pain Assessment Pain Assessment: No/denies pain   ADL:  See Function Navigator for Current Functional Status.   Therapy/Group: Individual Therapy  Justice 08/30/2015, 12:30 PM

## 2015-08-30 NOTE — Progress Notes (Signed)
Social Work Assessment and Plan  Patient Details  Name: David [name] MRN: [id number] Date of Birth: 1940/11/30  Today's Date: 08/30/2015  Problem List:  Patient Active Problem List   Diagnosis Date Noted  . Pain and swelling of ankle   . CRF (chronic renal failure)   . Urinary retention   . Hypertensive emergency 08/26/2015  . Hyperlipidemia 08/26/2015  . Obesity 08/26/2015  . Lung mass 08/26/2015  . Incontinence 08/26/2015  . Hemorrhagic stroke ([place]) 08/26/2015  . Hemiplegia and hemiparesis following nontraumatic intracerebral hemorrhage affecting right dominant side ([place]) 08/26/2015  . Aphasia following nontraumatic intracerebral hemorrhage 08/26/2015  . Gait disturbance, post-stroke 08/26/2015  . Benign essential HTN   . OSA (obstructive sleep apnea)   . CKD (chronic kidney disease)   . Pacemaker   . ICH (intracerebral hemorrhage) ([facility]) - L frontal due to HTN vs CAA 08/22/2015   Past Medical History:  Past Medical History  Diagnosis Date  . Hypertension   . Sleep apnea     uses CPAP  . High cholesterol   . Renal disorder   . Pacemaker     due to syncope  . Rectal bleed     due to NSAIDS   Past Surgical History:  Past Surgical History  Procedure Laterality Date  . Cardiac surgery      bypass X 2  . Knee arthroscopy  12/2008  . Foot surgery  08/2010   Social History:  reports that he has never smoked. He does not have any smokeless tobacco history on file. He reports that he drinks alcohol. He reports that he does not use illicit drugs.  Family / Support Systems Marital Status: Married Patient Roles: Spouse, Parent (Pt has a wife at home with dementia.  Dtr is an OB/GYN at [employer].) Spouse/Significant Other: David [name] - wife - [phone] Children: David [name] - dtr - [phone] Anticipated Caregiver: Wife can provide supervision and very light assistance per daughter.  CSW to verify this. Ability/Limitations of Caregiver: Wife has  dementia.  Daughter is an Software engineer and works at [employer]. Caregiver Availability: 24/7  Social History Preferred language: English Religion:  Education: Pt is a retired Pharmacist, community. Read: Yes Write: Yes Employment Status: Retired Date Retired/Disabled/Unemployed: 2007 Age Retired: 65 Public relations account executive Issues: none reported Guardian/Conservator: None at this time.  Pt is not able to make his own decisions, but his dtr is an OB/GYN and is helping him with medical decisions/plan.   Abuse/Neglect Physical Abuse: Denies Verbal Abuse: Denies Sexual Abuse: Denies Exploitation of patient/patient's resources: Denies Self-Neglect: Denies  Emotional Status Pt's affect, behavior and adjustment status: Pt was flat during CSW visit and seemed ambivalent to how rehab was going.  CSW to continue to assess this during his stay and refer to Neuropsychology when appropriate. Recent Psychosocial Issues: Pt recently moved to independent living at [facility]. Psychiatric History: none reported Substance Abuse History: none reported  Patient / Family Perceptions, Expectations & Goals Pt/Family understanding of illness & functional limitations: CSW will continue to assess this.  Awaiting return call from pt's dtr. Premorbid pt/family roles/activities: Pt was very independent PTA and was helping his wife at home. Anticipated changes in roles/activities/participation: Pt will need assistance at home, at least at the supervision level.  He will not be driving and will need help with errands, appointments, etc. Pt/family expectations/goals: Pt was not able to state.  CSW to ask dtr for family's goals/expectations.  Community [employer] Agencies: None Premorbid Home  Care/DME Agencies: Other (Comment) (Pt and wife recently moved to [facility] in an independent living apartment.) Transportation available at discharge: family Resource referrals recommended: Neuropsychology, Support group  (specify)  Discharge Planning Living Arrangements: Spouse/significant other Support Systems: Spouse/significant other, Children, Other relatives, Other (Comment) ([place]) Type of Residence: Private residence ([place]) Insurance Resources: Chartered certified accountant Resources: [place] Referred: No Money Management: Patient Does the patient have any problems obtaining your medications?: No Home Management: CSW to ask dtr who will assist with this. Patient/Family Preliminary Plans: CSW to speak with dtr about their plans for pt at d/c. Barriers to Discharge: Steps, Self care Social Work Anticipated Follow Up Needs: HH/OP, Support Group Expected length of stay: 21 to 24 days  Clinical Impression CSW visited with pt to introduce self and role of CSW, as well as to complete assessment.  Pt could answer some of CSW's questions, but deferred to his dtr, Dr. Silas [name], for things he did not know.  CSW called Dr. [name] and she will return CSW's call when she is available to talk.  CSW will ask her to fill in the gaps on what pt could not remember or did not know - PCP, plan at d/c, care wife can assist with, etc.  Pt was caring for his wife who has dementia and he stated he has watched her decline over the last two years.  They live in independent living at [facility].  CSW will continue to follow and assist pt/family with d/c planning.  Await return call from pt's dtr.  David [name], David [name] 08/31/2015, 10:03 AM

## 2015-08-30 NOTE — Progress Notes (Signed)
Pt places self on/off cpap.  Rt will monitor.

## 2015-08-30 NOTE — Progress Notes (Signed)
Occupational Therapy Session Note  Patient Details  Name: David [name] MRN: [id number] Date of Birth: February 08, 1941  Today's Date: 08/30/2015 OT Individual Time: 1300-1400 OT Individual Time Calculation (min): 60 min    Short Term Goals: Week 1:  OT Short Term Goal 1 (Week 1): Pt will perform toilet transfer with min A in order to decrease level of assist needed for functional transfers.  OT Short Term Goal 2 (Week 1): Pt will engage in 15 minutes of functional activity with 2 or less rest breaks secondary to fatigue. OT Short Term Goal 3 (Week 1): Pt will perform LB dressing with mod A in order to decrease level of assist with functional tasks.  OT Short Term Goal 4 (Week 1): Pt will perform shower transfer with min A in order to decrease level of assist for functional transfers.   Skilled Therapeutic Interventions/Progress Updates:    Treatment session with focus on sit > stand, standing balance, and functional transfers.  Pt received upright in w/c, requiring mod assist sit > stand from w/c and cues for technique.  Ambulated to therapy gym with RW and min assist for upright posture.  Engaged in sit > stand x5 throughout activity with pt progressing from mod assist/mod cues to supervision/min cues by end of session.  Completed 3D pipe tree puzzle in standing with pt maintaining standing 10 mins before seated rest break.  Incorporated sit > stand throughout task to increase independence.  Pt completed 30 mins standing activity with 2 seated rest breaks.  Returned to room and completed toilet transfer with min assist onto toilet and mod assist off from lower surface.  Recommend use of BSC over toilet to elevate seat height and increase independence with toilet transfers.  Therapy Documentation Precautions:  Precautions Precautions: Fall Precaution Comments: L ankle edema and pain with AROM and weight bearing Required Braces or Orthoses: Other Brace/Splint Other Brace/Splint: ASO   Restrictions Weight Bearing Restrictions: No General:   Vital Signs: Therapy Vitals Temp: 98.1 F (36.7 C) Temp Source: Oral Pulse Rate: 76 Resp: 18 BP: 121/61 mmHg Patient Position (if appropriate): Lying Oxygen Therapy SpO2: 97 % O2 Device: Not Delivered Pain:  Pt with no c/o pain  See Function Navigator for Current Functional Status.   Therapy/Group: Individual Therapy  [name] 08/30/2015, 3:13 PM

## 2015-08-30 NOTE — Progress Notes (Signed)
Pt's foley d/ced at 1030; assisted to stand to attempt void at 1600, unable to void, scan = 255cc, no I/O at this time. At 1800 assisted to stand, unable to void, I/O cathed for 500cc. Urine clear,  light amber. Good po fluid intake, See I/O FS.

## 2015-08-30 NOTE — Plan of Care (Signed)
Problem: RH BLADDER ELIMINATION Goal: RH STG MANAGE BLADDER WITH ASSISTANCE STG Manage Bladder With min Assistance  Outcome: Not Progressing Foley placed

## 2015-08-30 NOTE — Progress Notes (Signed)
Physical Therapy Session Note  Patient Details  Name: David [name] MRN: [id number] Date of Birth: October 25, 1940  Today's Date: 08/30/2015 PT Individual Time: 0905-1004 PT Individual Time Calculation (min): 59 min   Short Term Goals: Week 1:  PT Short Term Goal 1 (Week 1): Pt will transfer with consistent min assist and LRAD PT Short Term Goal 2 (Week 1): Pt will amb 50' with min assist and LRAD PT Short Term Goal 3 (Week 1): Pt will propel w/c 150' with supervision PT Short Term Goal 4 (Week 1): Pt will negotiate 4 steps with 1 rail and min assist  Skilled Therapeutic Interventions/Progress Updates:    Pt received sleeping in bed, easily awakes to voice, and agreeable to therapy.  Pt c/o pain in ankle but does not rate.  Session focus on balance, transfers, and gait.    Balance retraining at EOB for LB dressing with verbal cues for technique to thread pants and perform lateral leans to pull up pants.  Pt required max cues to stand to pull pants all the way up and fasten.  Balance retraining in gym focus on reaching and crossing midline to move horseshoes to/from basketball rim 2x6 trials with close supervision, progress to 2x6 trials on compliant surface with initial mod assist fade to close supervision with practice.    Pt transfers sit<>stand and stand/pivot throughout session with steady assist and min/mod verbal cues for sequencing question due to motor planning deficits.    Gait training [phone]' with RW and steady assist at pelvis with verbal cues for upright posture.  Pt demonstrates decreased stride length and decreased weight bearing through LLE due to ankle pain.  PT instructed pt in negotiation of 4 steps with 2 hand rails with mod assist to ascend and steady assist to descend.  Discussed possibility of installing 1-2 rails for pt's home entry to reduce risk of falling.  Pt states he's unsure whether he can add rails at the ILF or not.    Pt returned to room in w/c at end of session,  positioned upright in chair with call bell in reach and needs met.  RN in to deliver meds.   Therapy Documentation Precautions:  Precautions Precautions: Fall Precaution Comments: L ankle edema and pain with AROM and weight bearing Required Braces or Orthoses: Other Brace/Splint Other Brace/Splint: ASO  Restrictions Weight Bearing Restrictions: No   See Function Navigator for Current Functional Status.   Therapy/Group: Individual Therapy  Earnest Conroy Penven-Crew 08/30/2015, 9:46 AM

## 2015-08-30 NOTE — Progress Notes (Signed)
Speech Language Pathology Daily Session Note  Patient Details  Name: David [name] MRN: [id number] Date of Birth: 1940-12-28  Today's Date: 08/30/2015 SLP Individual Time: 5883-2549 SLP Individual Time Calculation (min): 45 min  Short Term Goals: Week 1: SLP Short Term Goal 1 (Week 1): Pt demonstrate >75% acc with Y/N?s with min A. SLP Short Term Goal 2 (Week 1): Pt demonstrate single word reading comprehension with min A. SLP Short Term Goal 3 (Week 1): Pt demonstrate object naming >75% with mod A. SLP Short Term Goal 4 (Week 1): Pt communicate basic needs/wants with mod A. SLP Short Term Goal 5 (Week 1): Pt demonstrate O x4 with mod A. SLP Short Term Goal 6 (Week 1): Pt tolerate regular diet at mod I.  Skilled Therapeutic Interventions: Skilled treatment session focused on dysphagia and cognitive-linguistic goals. SLP facilitated session by providing Mod A verbal cues for use of small bites and a slow rate of self-feeding with breakfast meal of regular textures with thin liquids. Patient consumed meal without overt s/s of aspiration and required liquid washes to clear minimal oral residue. SLP also facilitated session by providing Min-Mod A verbal and visual cues for reading comprehension at the word level from a field of two with 70% accuracy. Patient independently verbalized at the word level in regards to basic biographical information and was oriented to place and situation with verbal choices from a field of 2 with 100% accuracy. Patient left supine in bed with all needs within reach. Continue with current plan of care.   Function:  Eating Eating   Modified Consistency Diet: No Eating Assist Level: Supervision or verbal cues;Helper checks for pocketed food           Cognition Comprehension Comprehension assist level: Understands basic 50 - 74% of the time/ requires cueing 25 - 49% of the time  Expression   Expression assist level: Expresses basic 25 - 49% of the time/requires  cueing 50 - 75% of the time. Uses single words/gestures.  Social Interaction Social Interaction assist level: Interacts appropriately 75 - 89% of the time - Needs redirection for appropriate language or to initiate interaction.  Problem Solving Problem solving assist level: Solves basic less than 25% of the time - needs direction nearly all the time or does not effectively solve problems and may need a restraint for safety  Memory Memory assist level: Recognizes or recalls less than 25% of the time/requires cueing greater than 75% of the time    Pain No/Denies Pain   Therapy/Group: Individual Therapy  Taygen Newsome 08/30/2015, 3:39 PM

## 2015-08-30 NOTE — Progress Notes (Signed)
Patient placed on CPAP for the night with the assistance of RN. Kennieth Francois, RN

## 2015-08-30 NOTE — Progress Notes (Signed)
[facility] Individual Statement of Services  Patient Name:  David [name]  Date:  08/30/2015  Welcome to the [facility].  Our goal is to provide you with an individualized program based on your diagnosis and situation, designed to meet your specific needs.  With this comprehensive rehabilitation program, you will be expected to participate in at least 3 hours of rehabilitation therapies Monday-Friday, with modified therapy programming on the weekends.  Your rehabilitation program will include the following services:  Physical Therapy (PT), Occupational Therapy (OT), Speech Therapy (ST), 24 hour per day rehabilitation nursing, Neuropsychology, Case Management (Social Worker), Rehabilitation Medicine, Nutrition Services and Pharmacy Services  Weekly team conferences will be held on Wednesdays to discuss your progress.  Your Social Worker will talk with you frequently to get your input and to update you on team discussions.  Team conferences with you and your family in attendance may also be held.  Expected length of stay:  21 to 24 days  Overall anticipated outcome:  Supervision with minimal assistance for stairs  Depending on your progress and recovery, your program may change. Your Social Worker will coordinate services and will keep you informed of any changes. Your Social Worker's name and contact numbers are listed  below.  The following services may also be recommended but are not provided by the [facility] will be made to provide these services after discharge if needed.  Arrangements include referral to agencies that provide these services.  Your insurance has been verified to be:  Medicare Your primary doctor is:  Dr. [name]  Pertinent information will be shared with your doctor and your insurance  company.  Social Worker:  Alfonse Alpers, LCSW  [phone] or [phone]  Information discussed with and copy given to patient by: Trey Sailors, 08/30/2015, 1:30 PM

## 2015-08-31 ENCOUNTER — Inpatient Hospital Stay (HOSPITAL_COMMUNITY): Payer: Medicare Other | Admitting: *Deleted

## 2015-08-31 ENCOUNTER — Inpatient Hospital Stay (HOSPITAL_COMMUNITY): Payer: Medicare Other | Admitting: Physical Therapy

## 2015-08-31 ENCOUNTER — Inpatient Hospital Stay (HOSPITAL_COMMUNITY): Payer: Medicare Other | Admitting: Occupational Therapy

## 2015-08-31 ENCOUNTER — Inpatient Hospital Stay (HOSPITAL_COMMUNITY): Payer: Medicare Other | Admitting: Speech Pathology

## 2015-08-31 LAB — BASIC METABOLIC PANEL
Anion gap: 10 (ref 5–15)
BUN: 46 mg/dL — AB (ref 6–20)
CHLORIDE: 103 mmol/L (ref 101–111)
CO2: 22 mmol/L (ref 22–32)
Calcium: 9 mg/dL (ref 8.9–10.3)
Creatinine, Ser: 1.26 mg/dL — ABNORMAL HIGH (ref 0.61–1.24)
GFR calc Af Amer: >60 mL/min (ref >60)
GFR calc non Af Amer: 54 mL/min — ABNORMAL LOW (ref >60)
Glucose, Bld: 96 mg/dL (ref 65–99)
POTASSIUM: 4.4 mmol/L (ref 3.5–5.1)
SODIUM: 135 mmol/L (ref 135–145)

## 2015-08-31 MED ORDER — BETHANECHOL CHLORIDE 10 MG PO TABS
5.0000 mg | ORAL_TABLET | Freq: Three times a day (TID) | ORAL | Status: DC
  Administered 2015-08-31 – 2015-09-01 (×4): 5 mg via ORAL
  Filled 2015-08-31 (×4): qty 1

## 2015-08-31 NOTE — Progress Notes (Signed)
Speech Language Pathology Daily Session Note  Patient Details  Name: David [name] MRN: [id number] Date of Birth: 07/27/1940  Today's Date: 08/31/2015 SLP Individual Time: 0800-0900 SLP Individual Time Calculation (min): 60 min  Short Term Goals: Week 1: SLP Short Term Goal 1 (Week 1): Pt demonstrate >75% acc with Y/N?s with min A. SLP Short Term Goal 2 (Week 1): Pt demonstrate single word reading comprehension with min A. SLP Short Term Goal 3 (Week 1): Pt demonstrate object naming >75% with mod A. SLP Short Term Goal 4 (Week 1): Pt communicate basic needs/wants with mod A. SLP Short Term Goal 5 (Week 1): Pt demonstrate O x4 with mod A. SLP Short Term Goal 6 (Week 1): Pt tolerate regular diet at mod I.  Skilled Therapeutic Interventions: Pt seen for skilled SLP with focus on language. Given significant difficulty and resulting frustration, some focus also on cognition to take a break from language. Automatic speech required min- mod A of initiation and verbal/visual cueing to complete. Object naming 10% acc at mod I, 30% with semantic and phonemic cueing. Single word reading comprehension 0% acc. Pt expressed feeling discouraged with decline this a.m. Pt able to sequence 3-step pictures of functional activities with mod I for increased time/processing. Discussed using singing as a tool to gain fluency. Pt O x 4 with choices. Min A for verbal cueing with PO intake due to large bites/rapid rate of intake.   Function:  Eating Eating   Modified Consistency Diet: No Eating Assist Level: Supervision or verbal cues           Cognition Comprehension Comprehension assist level: Understands basic 50 - 74% of the time/ requires cueing 25 - 49% of the time  Expression   Expression assist level: Expresses basis less than 25% of the time/requires cueing >75% of the time.  Social Interaction Social Interaction assist level: Interacts appropriately 75 - 89% of the time - Needs redirection for  appropriate language or to initiate interaction.  Problem Solving Problem solving assist level: Solves basic 25 - 49% of the time - needs direction more than half the time to initiate, plan or complete simple activities  Memory Memory assist level: Recognizes or recalls 25 - 49% of the time/requires cueing 50 - 75% of the time    Pain Pain Assessment Pain Assessment: No/denies pain  Therapy/Group: Individual Therapy  Vinetta Bergamo MA, CCC-SLP 08/31/2015, 1:56 PM

## 2015-08-31 NOTE — Plan of Care (Signed)
Problem: RH BOWEL ELIMINATION Goal: RH STG MANAGE BOWEL WITH ASSISTANCE STG Manage Bowel with min Assistance.  Outcome: Not Progressing incontinent of bowel   Problem: RH BLADDER ELIMINATION Goal: RH STG MANAGE BLADDER WITH ASSISTANCE STG Manage Bladder With min Assistance  Outcome: Not Progressing Requiring I&O cath

## 2015-08-31 NOTE — Progress Notes (Signed)
RT placed patient on CPAP of 8. No O2 bleed in needed. Patient tolerating well.

## 2015-08-31 NOTE — Progress Notes (Addendum)
Subjective/Complaints: Patient lying in bed this morning. He is easily arousable. He appears to be able to communicate a little more freely today.  Review systems limited by aphasia and cognition  Objective: Vital Signs: Blood pressure 129/82, pulse 77, temperature 98.5 F (36.9 C), temperature source Oral, resp. rate 17, height 5' 9"  (1.753 m), SpO2 96 %. US Renal  08/29/2015  CLINICAL DATA:  75 year old with left frontal hematoma, also with acute superimposed upon chronic renal insufficiency. EXAM: RENAL / URINARY TRACT ULTRASOUND COMPLETE COMPARISON:  None. FINDINGS: Right Kidney: Length: Approximately 11.7 cm. Well-preserved cortex. Echogenic parenchyma. Approximate 2.3 x 1.9 x 1.6 cm anechoic mass with a thin internal septation demonstrating acoustic enhancement and no internal color Doppler flow involving the mid kidney. Approximate 1.1 x 1.0 x 1.3 cm anechoic mass with acoustic enhancement and no internal color Doppler flow involving the upper pole. No solid renal masses. No hydronephrosis. Left Kidney: Length: Approximately 12.1 cm. Well-preserved cortex. Echogenic parenchyma. Approximate 1.3 x 1.3 x 1.1 cm anechoic mass with acoustic enhancement and no internal color Doppler flow involving the upper pole. Approximate 1.2 x 0.9 x 0.6 cm anechoic mass with acoustic enhancement and no internal color Doppler flow involving the mid kidney. No solid renal masses. No hydronephrosis. Bladder: Decompressed by Foley catheter. IMPRESSION: 1. No evidence of hydronephrosis involving either kidney to suggest urinary tract obstruction as a cause of renal insufficiency. 2. Echogenic parenchyma bilaterally indicating medical renal disease. 3. Cortical cysts involving both kidneys. No significant focal parenchymal abnormality involving either kidney. Electronically Signed   By: Evangeline Dakin M.D.   On: 08/29/2015 15:50   Results for orders placed or performed during the hospital encounter of 08/26/15 (from the  past 72 hour(s))  CBC     Status: None   Collection Time: 08/28/15  8:00 PM  Result Value Ref Range   WBC 7.3 4.0 - 10.5 K/uL   RBC 4.40 4.22 - 5.81 MIL/uL   Hemoglobin 14.2 13.0 - 17.0 g/dL   HCT 41.3 39.0 - 52.0 %   MCV 93.9 78.0 - 100.0 fL   MCH 32.3 26.0 - 34.0 pg   MCHC 34.4 30.0 - 36.0 g/dL   RDW 14.9 11.5 - 15.5 %   Platelets 240 150 - 400 K/uL  Basic metabolic panel     Status: Abnormal   Collection Time: 08/28/15  8:00 PM  Result Value Ref Range   Sodium 133 (L) 135 - 145 mmol/L   Potassium 4.7 3.5 - 5.1 mmol/L   Chloride 101 101 - 111 mmol/L   CO2 21 (L) 22 - 32 mmol/L   Glucose, Bld 135 (H) 65 - 99 mg/dL   BUN 65 (H) 6 - 20 mg/dL   Creatinine, Ser 1.91 (H) 0.61 - 1.24 mg/dL   Calcium 8.9 8.9 - 10.3 mg/dL   GFR calc non Af Amer 32 (L) >60 mL/min   GFR calc Af Amer 38 (L) >60 mL/min    Comment: (NOTE) The eGFR has been calculated using the CKD EPI equation. This calculation has not been validated in all clinical situations. eGFR's persistently <60 mL/min signify possible Chronic Kidney Disease.    Anion gap 11 5 - 15  Urinalysis, Routine w reflex microscopic (not at [facility])     Status: Abnormal   Collection Time: 08/28/15 10:47 PM  Result Value Ref Range   Color, Urine YELLOW YELLOW   APPearance CLOUDY (A) CLEAR   Specific Gravity, Urine 1.015 1.005 - 1.030   pH 5.5  5.0 - 8.0   Glucose, UA NEGATIVE NEGATIVE mg/dL   Hgb urine dipstick SMALL (A) NEGATIVE   Bilirubin Urine NEGATIVE NEGATIVE   Ketones, ur NEGATIVE NEGATIVE mg/dL   Protein, ur 30 (A) NEGATIVE mg/dL   Nitrite NEGATIVE NEGATIVE   Leukocytes, UA NEGATIVE NEGATIVE  Urine microscopic-add on     Status: Abnormal   Collection Time: 08/28/15 10:47 PM  Result Value Ref Range   Squamous Epithelial / LPF NONE SEEN NONE SEEN   WBC, UA 0-5 0 - 5 WBC/hpf   RBC / HPF 0-5 0 - 5 RBC/hpf   Bacteria, UA RARE (A) NONE SEEN   Casts HYALINE CASTS (A) NEGATIVE   Urine-Other AMORPHOUS URATES/PHOSPHATES   Culture,  Urine     Status: None   Collection Time: 08/28/15 10:48 PM  Result Value Ref Range   Specimen Description URINE, CATHETERIZED    Special Requests NONE    Culture NO GROWTH    Report Status 08/30/2015 FINAL   CBC     Status: Abnormal   Collection Time: 08/29/15  4:54 AM  Result Value Ref Range   WBC 6.7 4.0 - 10.5 K/uL   RBC 4.18 (L) 4.22 - 5.81 MIL/uL   Hemoglobin 13.0 13.0 - 17.0 g/dL   HCT 38.7 (L) 39.0 - 52.0 %   MCV 92.6 78.0 - 100.0 fL   MCH 31.1 26.0 - 34.0 pg   MCHC 33.6 30.0 - 36.0 g/dL   RDW 15.0 11.5 - 15.5 %   Platelets 232 150 - 400 K/uL  Comprehensive metabolic panel     Status: Abnormal   Collection Time: 08/29/15  4:54 AM  Result Value Ref Range   Sodium 133 (L) 135 - 145 mmol/L   Potassium 4.2 3.5 - 5.1 mmol/L   Chloride 100 (L) 101 - 111 mmol/L   CO2 21 (L) 22 - 32 mmol/L   Glucose, Bld 105 (H) 65 - 99 mg/dL   BUN 62 (H) 6 - 20 mg/dL   Creatinine, Ser 1.73 (H) 0.61 - 1.24 mg/dL   Calcium 8.6 (L) 8.9 - 10.3 mg/dL   Total Protein 6.3 (L) 6.5 - 8.1 g/dL   Albumin 2.4 (L) 3.5 - 5.0 g/dL   AST 56 (H) 15 - 41 U/L   ALT 55 17 - 63 U/L   Alkaline Phosphatase 46 38 - 126 U/L   Total Bilirubin 0.5 0.3 - 1.2 mg/dL   GFR calc non Af Amer 37 (L) >60 mL/min   GFR calc Af Amer 42 (L) >60 mL/min    Comment: (NOTE) The eGFR has been calculated using the CKD EPI equation. This calculation has not been validated in all clinical situations. eGFR's persistently <60 mL/min signify possible Chronic Kidney Disease.    Anion gap 12 5 - 15  Basic metabolic panel     Status: Abnormal   Collection Time: 08/30/15  8:01 AM  Result Value Ref Range   Sodium 136 135 - 145 mmol/L   Potassium 4.7 3.5 - 5.1 mmol/L   Chloride 106 101 - 111 mmol/L   CO2 23 22 - 32 mmol/L   Glucose, Bld 100 (H) 65 - 99 mg/dL   BUN 52 (H) 6 - 20 mg/dL   Creatinine, Ser 1.45 (H) 0.61 - 1.24 mg/dL   Calcium 8.8 (L) 8.9 - 10.3 mg/dL   GFR calc non Af Amer 45 (L) >60 mL/min   GFR calc Af Amer 52 (L)  >60 mL/min    Comment: (NOTE) The  eGFR has been calculated using the CKD EPI equation. This calculation has not been validated in all clinical situations. eGFR's persistently <60 mL/min signify possible Chronic Kidney Disease.    Anion gap 7 5 - 15  PSA     Status: None   Collection Time: 08/30/15  8:01 AM  Result Value Ref Range   PSA 3.90 0.00 - 4.00 ng/mL    Comment: (NOTE) While PSA levels of <=4.0 ng/ml are reported as reference range, some men with levels below 4.0 ng/ml can have prostate cancer and many men with PSA above 4.0 ng/ml do not have prostate cancer.  Other tests such as free PSA, age specific reference ranges, PSA velocity and PSA doubling time may be helpful especially in men less than 101 years old.   Basic metabolic panel     Status: Abnormal   Collection Time: 08/31/15  4:40 AM  Result Value Ref Range   Sodium 135 135 - 145 mmol/L   Potassium 4.4 3.5 - 5.1 mmol/L   Chloride 103 101 - 111 mmol/L   CO2 22 22 - 32 mmol/L   Glucose, Bld 96 65 - 99 mg/dL   BUN 46 (H) 6 - 20 mg/dL   Creatinine, Ser 1.26 (H) 0.61 - 1.24 mg/dL   Calcium 9.0 8.9 - 10.3 mg/dL   GFR calc non Af Amer 54 (L) >60 mL/min   GFR calc Af Amer >60 >60 mL/min    Comment: (NOTE) The eGFR has been calculated using the CKD EPI equation. This calculation has not been validated in all clinical situations. eGFR's persistently <60 mL/min signify possible Chronic Kidney Disease.    Anion gap 10 5 - 15    Gen. no acute distress. Vital signs reviewed. Well-developed, well-nourished. HENT: Normocephalic atraumatic Eyes: Conjunctivae and EOM normal Cardio: RRR and No murmur Resp: CTA B/L and unlabored GI: BS positive and nondistended nontender Musc/Skel:  No edema, no tenderness noted. Neuro:  Global aphasia, Expressive > receptive Alert, confused Able to perform demonstrated commands for the most part Motor: 4+/5 in the right deltoid, biceps, triceps, grip, hip flexor, knee extensor, ankle  dorsiflex and plantar flexor.  5/5 on the left side in the same muscle groups., Skin:   Intact. Warm and dry.  Assessment/Plan: 1. Functional deficits secondary to Left frontal intracranial hypertensive hemorrhage  which require 3+ hours per day of interdisciplinary therapy in a comprehensive inpatient rehab setting. Physiatrist is providing close team supervision and 24 hour management of active medical problems listed below. Physiatrist and rehab team continue to assess barriers to discharge/monitor patient progress toward functional and medical goals. FIM: Function - Bathing Position: Shower Body parts bathed by patient: Right arm, Left arm, Chest, Abdomen, Front perineal area, Right upper leg, Left upper leg, Buttocks Body parts bathed by helper: Right lower leg, Left lower leg, Back Assist Level: Touching or steadying assistance(Pt > 75%)  Function- Upper Body Dressing/Undressing What is the patient wearing?: Pull over shirt/dress Pull over shirt/dress - Perfomed by patient: Thread/unthread right sleeve, Thread/unthread left sleeve, Put head through opening, Pull shirt over trunk Assist Level: Set up Set up : To obtain clothing/put away Function - Lower Body Dressing/Undressing What is the patient wearing?: Pants, Socks Position: Wheelchair/chair at sink Pants- Performed by patient: Thread/unthread left pants leg, Thread/unthread right pants leg, Pull pants up/down, Fasten/unfasten pants Pants- Performed by helper: Pull pants up/down, Thread/unthread right pants leg Non-skid slipper socks- Performed by helper: Don/doff right sock, Don/doff left sock Assist for footwear: Dependant  Assist for lower body dressing: Touching or steadying assistance (Pt > 75%)  Function - Toileting Toileting activity did not occur: No continent bowel/bladder event Toileting steps completed by patient:  (gown on)  Function - Air cabin crew transfer activity did not occur: Risk analyst  transfer assistive device: Grab bar, Walker Assist level to toilet: Touching or steadying assistance (Pt > 75%) Assist level from toilet: Moderate assist (Pt 50 - 74%/lift or lower)  Function - Chair/bed transfer Chair/bed transfer method: Stand pivot Chair/bed transfer assist level: Touching or steadying assistance (Pt > 75%) Chair/bed transfer assistive device: Armrests, Walker Chair/bed transfer details: Verbal cues for sequencing, Verbal cues for technique, Tactile cues for posture, Tactile cues for weight bearing  Function - Locomotion: Wheelchair Will patient use wheelchair at discharge?: No Type: Manual Max wheelchair distance: 150 Assist Level: Supervision or verbal cues Assist Level: Supervision or verbal cues Wheel 150 feet activity did not occur: Safety/medical concerns Assist Level: Supervision or verbal cues Turns around,maneuvers to table,bed, and toilet,negotiates 3% grade,maneuvers on rugs and over doorsills: No Function - Locomotion: Ambulation Assistive device: Walker-rolling Max distance: 105 Assist level: Touching or steadying assistance (Pt > 75%) Assist level: Touching or steadying assistance (Pt > 75%) Assist level: Touching or steadying assistance (Pt > 75%) Walk 150 feet activity did not occur: Safety/medical concerns Walk 10 feet on uneven surfaces activity did not occur: Safety/medical concerns  Function - Comprehension Comprehension: Auditory Comprehension assist level: Understands basic 50 - 74% of the time/ requires cueing 25 - 49% of the time  Function - Expression Expression: Verbal Expression assist level: Expresses basic 25 - 49% of the time/requires cueing 50 - 75% of the time. Uses single words/gestures.  Function - Social Interaction Social Interaction assist level: Interacts appropriately 75 - 89% of the time - Needs redirection for appropriate language or to initiate interaction.  Function - Problem Solving Problem solving assist level:  Solves basic less than 25% of the time - needs direction nearly all the time or does not effectively solve problems and may need a restraint for safety  Function - Memory Memory assist level: Recognizes or recalls less than 25% of the time/requires cueing greater than 75% of the time Patient normally able to recall (first 3 days only): That he or she is in a hospital, Location of own room  Medical Problem List and Plan: 1.  Right hemiparesis, aphasia, apraxia and cognitive deficits secondary to Left frontal intracranial hypertensive hemorrhage  Continue CIR 2.  DVT Prophylaxis/Anticoagulation: Mechanical: Sequential compression devices, below knee Bilateral lower extremities 3. Pain Management: tylenol prn,   left ankle pain suspect sprain, x-ray reviewed, showing avulsed bony fragment distal tibia medial malleolus of indeterminate age. Continue Aircast, weightbearing as tolerated 4. Mood:  Seems frustrated due to expressive/receptive deficits. Team to provide ego support. LCSW to follow for evaluation and support.   5. Neuropsych: This patient is not capable of making decisions on his own behalf. 6. Skin/Wound Care: routine pressure relief measures 7. Fluids/Electrolytes/Nutrition:  Monitor I/O.   BMP improving on 5/23 8.  CAD s/p CABG: On coreg and crestor 9. Labile BP: Monitor BP. Lisinoprildiscontinued and Coreg dose down titrated.  Improved with IVF, DC'd on 5/22 10. CKD: BUN/Creatinine 32/1.5 at admission and at baseline.     Creatinine 1.26 on 5/23, improving  Will continue to monitor  Renal ultrasound reviewed suggesting medical renal disease, junction  Continue to Encourage fluid intake.   11. Depression: Continue zoloft daily, increased on 5/22.  12. Hypothyroid: Resumed supplement.   13. LLL mass: CT chest when renal status stabilizes.   14. Incontinence: Likely due to cognitive and processing issues--UA essentially neg except for some hematuria 15. Urinary retention  Urine  culture negative on 5/21  Renal U/S reviewed suggesting medical renal disease, no obstruction.  Bethanechol 5 mg started 5/23, will monitor 16. BPH: Started Uroxatral 17. OSA : Continue CPAP  LOS (Days) 5 A FACE TO FACE EVALUATION WAS PERFORMED  David [name] 08/31/2015, 9:43 AM   Diarrhea

## 2015-08-31 NOTE — Plan of Care (Signed)
Problem: RH BLADDER ELIMINATION Goal: RH STG MANAGE BLADDER WITH ASSISTANCE STG Manage Bladder With min Assistance  Outcome: Not Progressing Requiring I&O cath

## 2015-08-31 NOTE — Progress Notes (Signed)
Recreational Therapy Session Note  Patient Details  Name: David [name] MRN: [id number] Date of Birth: 08/16/1940 Today's Date: 08/31/2015  Attempted eval completion, but difficult due to aphasia and pt's frustration level.  Leisure interest screen left in room for family completion.  [place] 08/31/2015, 3:15 PM

## 2015-08-31 NOTE — Progress Notes (Signed)
Occupational Therapy Session Note  Patient Details  Name: David [name] MRN: [id number] Date of Birth: 08-31-1940  Today's Date: 08/31/2015 OT Individual Time: [phone] and 1415-1445 OT Individual Time Calculation (min): 58 min and 30 min    Short Term Goals: Week 1:  OT Short Term Goal 1 (Week 1): Pt will perform toilet transfer with min A in order to decrease level of assist needed for functional transfers.  OT Short Term Goal 2 (Week 1): Pt will engage in 15 minutes of functional activity with 2 or less rest breaks secondary to fatigue. OT Short Term Goal 3 (Week 1): Pt will perform LB dressing with mod A in order to decrease level of assist with functional tasks.  OT Short Term Goal 4 (Week 1): Pt will perform shower transfer with min A in order to decrease level of assist for functional transfers.   Skilled Therapeutic Interventions/Progress Updates:    Session 1: Upon entering the room, pt seated in wheelchair awaiting therapist arrival. Pt declined shower stating, "I did that yesterday." Pt ambulated with min A and use of RW to dresser to obtain clothing items. Pt in squatted position in order to open drawers 2 and 3 with min A for balance and use of RW. Pt required mod verbal questioning cues to obtain clothing items. Pt then ambulating to sit in wheelchair at sink for dressing tasks. Pt needing min - mod A for standing balance with LB dressing tasks. Pt with increased frustration with verbal communication. When provided with a choice of 2 options either verbally or written pt was able to answer effectively and correctly 10/10 times with increased time and min cues. Pt remained seated in wheelchair with quick release belt donned at end of session. Call bell within reach.     Session 2: Upon entering the room, pt supine in bed with wife present in the room. Pt verbalizes he does not need to use the bathroom. However, OT encouraged pt to practice toileting transfer with pt agreeing. Pt  performed supine >sit with steady assist to EOB. Pt ambulated with RW and min A into bathroom. Pt able to perform clothing management and hygiene with steady assist for balance after having BM. Pt returning to stand at sink for mod multimodal cues for sequencing and initiation to wash hands. Pt returning to bed at end of session with bed alarm activated and call bell within reach upon exiting the room.   Therapy Documentation Precautions:  Precautions Precautions: Fall Precaution Comments: L ankle edema and pain with AROM and weight bearing Required Braces or Orthoses: Other Brace/Splint Other Brace/Splint: ASO  Restrictions Weight Bearing Restrictions: No General:   Vital Signs: Therapy Vitals Temp: 98.7 F (37.1 C) Temp Source: Oral Pulse Rate: 75 Resp: 18 BP: (!) 144/79 mmHg Patient Position (if appropriate): Lying Oxygen Therapy SpO2: 98 % O2 Device: Not Delivered  See Function Navigator for Current Functional Status.   Therapy/Group: Individual Therapy  [name] 08/31/2015, 5:20 PM

## 2015-08-31 NOTE — Progress Notes (Signed)
Physical Therapy Session Note  Patient Details  Name: David [name] MRN: [id number] Date of Birth: 1941-03-18  Today's Date: 08/31/2015 PT Individual Time: 1117-1206 PT Individual Time Calculation (min): 49 min   Short Term Goals: Week 1:  PT Short Term Goal 1 (Week 1): Pt will transfer with consistent min assist and LRAD PT Short Term Goal 2 (Week 1): Pt will amb 50' with min assist and LRAD PT Short Term Goal 3 (Week 1): Pt will propel w/c 150' with supervision PT Short Term Goal 4 (Week 1): Pt will negotiate 4 steps with 1 rail and min assist  Skilled Therapeutic Interventions/Progress Updates:    Pt received upright in w/c with no c/o pain and agreeable to therapy session.  Session focus on L NMR, endurance, transfers, stair negotiation, and edema management.    Pt propelled w/c x100' with more than a reasonable amount of time and supervision with verbal cues to use LUE.  Nustep x12 minutes at level 4 for NMR forced use, reciprocal stepping pattern, reciprocal UE movement, and overall endurance.  PT instructed pt in stair negotiation 2x4 steps with 2 handrails, initially mod assist for first step progress to min assist for remaining 7 steps.  PT provided min verbal cues for sequencing of LE advancement and hand placement.    PT assessed pt's L ankle due to ongoing pain and swelling.  Painful PROM with plantarflexion>inversion>eversion.  Not painful with dorsiflexion.  PT applied kinesiotape for edema management to pt's L foot.    Pt returned to room at end of session and positioned in bed with steady assist, call bell in reach and needs met.   Therapy Documentation Precautions:  Precautions Precautions: Fall Precaution Comments: L ankle edema and pain with AROM and weight bearing Required Braces or Orthoses: Other Brace/Splint Other Brace/Splint: ASO  Restrictions Weight Bearing Restrictions: No   See Function Navigator for Current Functional Status.   Therapy/Group:  Individual Therapy  Earnest Conroy Penven-Crew 08/31/2015, 12:15 PM

## 2015-09-01 ENCOUNTER — Inpatient Hospital Stay (HOSPITAL_COMMUNITY): Payer: Medicare Other | Admitting: Occupational Therapy

## 2015-09-01 ENCOUNTER — Inpatient Hospital Stay (HOSPITAL_COMMUNITY): Payer: Medicare Other

## 2015-09-01 ENCOUNTER — Inpatient Hospital Stay (HOSPITAL_COMMUNITY): Payer: Medicare Other | Admitting: Speech Pathology

## 2015-09-01 ENCOUNTER — Inpatient Hospital Stay (HOSPITAL_COMMUNITY): Payer: Medicare Other | Admitting: Physical Therapy

## 2015-09-01 DIAGNOSIS — J984 Other disorders of lung: Secondary | ICD-10-CM

## 2015-09-01 DIAGNOSIS — R918 Other nonspecific abnormal finding of lung field: Secondary | ICD-10-CM | POA: Insufficient documentation

## 2015-09-01 LAB — BASIC METABOLIC PANEL
ANION GAP: 10 (ref 5–15)
BUN: 42 mg/dL — ABNORMAL HIGH (ref 6–20)
CALCIUM: 9.1 mg/dL (ref 8.9–10.3)
CO2: 21 mmol/L — ABNORMAL LOW (ref 22–32)
Chloride: 104 mmol/L (ref 101–111)
Creatinine, Ser: 1.16 mg/dL (ref 0.61–1.24)
GFR, EST NON AFRICAN AMERICAN: 59 mL/min — AB (ref >60)
Glucose, Bld: 100 mg/dL — ABNORMAL HIGH (ref 65–99)
POTASSIUM: 4.4 mmol/L (ref 3.5–5.1)
SODIUM: 135 mmol/L (ref 135–145)

## 2015-09-01 MED ORDER — BETHANECHOL CHLORIDE 10 MG PO TABS
10.0000 mg | ORAL_TABLET | Freq: Three times a day (TID) | ORAL | Status: DC
  Administered 2015-09-01 – 2015-09-02 (×3): 10 mg via ORAL
  Filled 2015-09-01 (×3): qty 1

## 2015-09-01 MED ORDER — SODIUM CHLORIDE 0.9 % IV SOLN
INTRAVENOUS | Status: DC
  Administered 2015-09-01: 11:00:00 via INTRAVENOUS

## 2015-09-01 MED ORDER — IOPAMIDOL (ISOVUE-300) INJECTION 61%
INTRAVENOUS | Status: AC
  Administered 2015-09-01: 80 mL
  Filled 2015-09-01: qty 100

## 2015-09-01 MED ORDER — LIDOCAINE HCL 2 % EX GEL
1.0000 "application " | CUTANEOUS | Status: DC | PRN
  Filled 2015-09-01 (×2): qty 5

## 2015-09-01 MED ORDER — LIDOCAINE HCL 2 % EX GEL: 1.0000 "application " | Freq: Once | CUTANEOUS | Status: DC

## 2015-09-01 NOTE — Progress Notes (Signed)
Subjective/Complaints: Pt laying in bed this AM.  He appears to indicate he slept well overnight.    Review systems limited by aphasia and cognition  Objective: Vital Signs: Blood pressure 144/79, pulse 76, temperature 97.5 F (36.4 C), temperature source Oral, resp. rate 17, height _0  (1.753 m), SpO2 92 %. No results found. Results for orders placed or performed during the hospital encounter of 08/26/15 (from the past 72 hour(s))  Basic metabolic panel     Status: Abnormal   Collection Time: 08/30/15  8:01 AM  Result Value Ref Range   Sodium 136 135 - 145 mmol/L   Potassium 4.7 3.5 - 5.1 mmol/L   Chloride 106 101 - 111 mmol/L   CO2 23 22 - 32 mmol/L   Glucose, Bld 100 (H) 65 - 99 mg/dL   BUN 52 (H) 6 - 20 mg/dL   Creatinine, Ser 1.45 (H) 0.61 - 1.24 mg/dL   Calcium 8.8 (L) 8.9 - 10.3 mg/dL   GFR calc non Af Amer 45 (L) >60 mL/min   GFR calc Af Amer 52 (L) >60 mL/min    Comment: (NOTE) The eGFR has been calculated using the CKD EPI equation. This calculation has not been validated in all clinical situations. eGFR's persistently <60 mL/min signify possible Chronic Kidney Disease.    Anion gap 7 5 - 15  PSA     Status: None   Collection Time: 08/30/15  8:01 AM  Result Value Ref Range   PSA 3.90 0.00 - 4.00 ng/mL    Comment: (NOTE) While PSA levels of <=4.0 ng/ml are reported as reference range, some men with levels below 4.0 ng/ml can have prostate cancer and many men with PSA above 4.0 ng/ml do not have prostate cancer.  Other tests such as free PSA, age specific reference ranges, PSA velocity and PSA doubling time may be helpful especially in men less than 90 years old.   Basic metabolic panel     Status: Abnormal   Collection Time: 08/31/15  4:40 AM  Result Value Ref Range   Sodium 135 135 - 145 mmol/L   Potassium 4.4 3.5 - 5.1 mmol/L   Chloride 103 101 - 111 mmol/L   CO2 22 22 - 32 mmol/L   Glucose, Bld 96 65 - 99 mg/dL   BUN 46 (H) 6 - 20 mg/dL   Creatinine,  Ser 1.26 (H) 0.61 - 1.24 mg/dL   Calcium 9.0 8.9 - 10.3 mg/dL   GFR calc non Af Amer 54 (L) >60 mL/min   GFR calc Af Amer >60 >60 mL/min    Comment: (NOTE) The eGFR has been calculated using the CKD EPI equation. This calculation has not been validated in all clinical situations. eGFR's persistently <60 mL/min signify possible Chronic Kidney Disease.    Anion gap 10 5 - 15  Basic metabolic panel     Status: Abnormal   Collection Time: 09/01/15  7:00 AM  Result Value Ref Range   Sodium 135 135 - 145 mmol/L   Potassium 4.4 3.5 - 5.1 mmol/L   Chloride 104 101 - 111 mmol/L   CO2 21 (L) 22 - 32 mmol/L   Glucose, Bld 100 (H) 65 - 99 mg/dL   BUN 42 (H) 6 - 20 mg/dL   Creatinine, Ser 1.16 0.61 - 1.24 mg/dL   Calcium 9.1 8.9 - 10.3 mg/dL   GFR calc non Af Amer 59 (L) >60 mL/min   GFR calc Af Amer >60 >60 mL/min    Comment: (  NOTE) The eGFR has been calculated using the CKD EPI equation. This calculation has not been validated in all clinical situations. eGFR's persistently <60 mL/min signify possible Chronic Kidney Disease.    Anion gap 10 5 - 15    Gen. no acute distress. Vital signs reviewed. Well-developed, well-nourished. HENT: Normocephalic atraumatic Eyes: Conjunctivae and EOM normal Cardio: RRR and No murmur Resp: CTA B/L and unlabored GI: BS positive and nondistended nontender Musc/Skel: No edema, no tenderness noted. Neuro:  Global aphasia, Expressive >> receptive Alert, confused Able to perform demonstrated commands for the most part Motor: 4+/5 in the right deltoid, biceps, triceps, grip, hip flexor, knee extensor, ankle dorsiflex and plantar flexor.  5/5 on the left side in the same muscle groups., Skin:   Intact. Warm and dry.  Assessment/Plan: 1. Functional deficits secondary to Left frontal intracranial hypertensive hemorrhage  which require 3+ hours per day of interdisciplinary therapy in a comprehensive inpatient rehab setting. Physiatrist is providing close  team supervision and 24 hour management of active medical problems listed below. Physiatrist and rehab team continue to assess barriers to discharge/monitor patient progress toward functional and medical goals. FIM: Function - Bathing Bathing activity did not occur: Refused Position: Shower Body parts bathed by patient: Right arm, Left arm, Chest, Abdomen, Front perineal area, Right upper leg, Left upper leg, Buttocks Body parts bathed by helper: Right lower leg, Left lower leg, Back Assist Level: Touching or steadying assistance(Pt > 75%)  Function- Upper Body Dressing/Undressing What is the patient wearing?: Pull over shirt/dress Pull over shirt/dress - Perfomed by patient: Thread/unthread right sleeve, Thread/unthread left sleeve, Put head through opening, Pull shirt over trunk Assist Level: Supervision or verbal cues Set up : To obtain clothing/put away Function - Lower Body Dressing/Undressing What is the patient wearing?: Pants, Socks Position: Wheelchair/chair at sink Pants- Performed by patient: Thread/unthread left pants leg, Thread/unthread right pants leg, Pull pants up/down, Fasten/unfasten pants Pants- Performed by helper: Pull pants up/down, Thread/unthread right pants leg Non-skid slipper socks- Performed by patient: Don/doff right sock, Don/doff left sock Non-skid slipper socks- Performed by helper: Don/doff right sock, Don/doff left sock Assist for footwear: Supervision/touching assist Assist for lower body dressing: Touching or steadying assistance (Pt > 75%)  Function - Toileting Toileting activity did not occur: No continent bowel/bladder event Toileting steps completed by patient: Adjust clothing prior to toileting, Performs perineal hygiene, Adjust clothing after toileting Toileting steps completed by helper: Adjust clothing prior to toileting, Performs perineal hygiene, Adjust clothing after toileting Assist level: Touching or steadying assistance  (Pt.75%)  Function - Air cabin crew transfer activity did not occur: Refused Toilet transfer assistive device: Grab bar, Walker Assist level to toilet: Moderate assist (Pt 50 - 74%/lift or lower) Assist level from toilet: Moderate assist (Pt 50 - 74%/lift or lower) Assist level to bedside commode (at bedside): 2 helpers (per Phebe Colla, NT) Assist level from bedside commode (at bedside): 2 helpers  Function - Chair/bed transfer Chair/bed transfer method: Stand pivot, Ambulatory Chair/bed transfer assist level: Touching or steadying assistance (Pt > 75%) Chair/bed transfer assistive device: Armrests, Walker Chair/bed transfer details: Verbal cues for sequencing, Verbal cues for technique, Tactile cues for posture, Tactile cues for weight bearing  Function - Locomotion: Wheelchair Will patient use wheelchair at discharge?: No Type: Manual Max wheelchair distance: 100 Assist Level: Supervision or verbal cues Assist Level: Supervision or verbal cues Wheel 150 feet activity did not occur: Safety/medical concerns Assist Level: Supervision or verbal cues Turns around,maneuvers to table,bed, and  toilet,negotiates 3% grade,maneuvers on rugs and over doorsills: No Function - Locomotion: Ambulation Assistive device: Walker-rolling Max distance: 105 Assist level: Touching or steadying assistance (Pt > 75%) Assist level: Touching or steadying assistance (Pt > 75%) Assist level: Touching or steadying assistance (Pt > 75%) Walk 150 feet activity did not occur: Safety/medical concerns Walk 10 feet on uneven surfaces activity did not occur: Safety/medical concerns  Function - Comprehension Comprehension: Auditory Comprehension assist level: Understands basic 50 - 74% of the time/ requires cueing 25 - 49% of the time  Function - Expression Expression: Verbal Expression assist level: Expresses basis less than 25% of the time/requires cueing >75% of the time.  Function - Social  Interaction Social Interaction assist level: Interacts appropriately 50 - 74% of the time - May be physically or verbally inappropriate.  Function - Problem Solving Problem solving assist level: Solves basic 25 - 49% of the time - needs direction more than half the time to initiate, plan or complete simple activities  Function - Memory Memory assist level: Recognizes or recalls 25 - 49% of the time/requires cueing 50 - 75% of the time Patient normally able to recall (first 3 days only): That he or she is in a hospital, Location of own room  Medical Problem List and Plan: 1.  Right hemiparesis, aphasia, apraxia and cognitive deficits secondary to Left frontal intracranial hypertensive hemorrhage  Continue CIR 2.  DVT Prophylaxis/Anticoagulation: Mechanical: Sequential compression devices, below knee Bilateral lower extremities 3. Pain Management: tylenol prn,   left ankle pain suspect sprain, x-ray reviewed, showing avulsed bony fragment distal tibia medial malleolus of indeterminate age. Continue Aircast, weightbearing as tolerated 4. Mood:  Seems frustrated due to expressive/receptive deficits. Team to provide ego support. LCSW to follow for evaluation and support.   5. Neuropsych: This patient is not capable of making decisions on his own behalf. 6. Skin/Wound Care: routine pressure relief measures 7. Fluids/Electrolytes/Nutrition:  Monitor I/O.   BMP improving on 5/23 8.  CAD s/p CABG: On coreg and crestor 9. Labile BP: Monitor BP. Lisinoprildiscontinued and Coreg dose down titrated.  Improved with IVF, DC'd on 5/22 10. CKD: BUN/Creatinine 32/1.5 at admission and at baseline.     Creatinine 1.16 on 5/24, improving  IVF started for CT, will plan to d/c tomorrow  Will continue to monitor  Renal ultrasound reviewed suggesting medical renal disease disease  Continue to Encourage fluid intake.   11. Depression: Continue zoloft daily, increased on 5/22.   12. Hypothyroid: Resumed  supplement.   13. LLL mass: Will order CT chest as renal status has improved 14. Incontinence: Likely due to cognitive and processing issues--UA essentially neg except for some hematuria 15. Urinary retention  Urine culture negative on 5/21  Renal U/S reviewed suggesting medical renal disease, no obstruction.  Bethanechol 5 mg started 5/23, increased to 10 on 5/24 will monitor 16. BPH: Started Uroxatral 17. OSA : Continue CPAP  LOS (Days) 6 A FACE TO FACE EVALUATION WAS PERFORMED  Ankit Lorie Phenix 09/01/2015, 10:48 AM   Diarrhea

## 2015-09-01 NOTE — Progress Notes (Signed)
Physical Therapy Session Note  Patient Details  Name: David [name] MRN: [id number] Date of Birth: 1940-07-29  Today's Date: 09/01/2015 PT Individual Time: 1400-1430 PT Individual Time Calculation (min): 30 min   Short Term Goals: Week 1:  PT Short Term Goal 1 (Week 1): Pt will transfer with consistent min assist and LRAD PT Short Term Goal 2 (Week 1): Pt will amb 50' with min assist and LRAD PT Short Term Goal 3 (Week 1): Pt will propel w/c 150' with supervision PT Short Term Goal 4 (Week 1): Pt will negotiate 4 steps with 1 rail and min assist  Skilled Therapeutic Interventions/Progress Updates:    Pt received resting in w/c and agreeable to therapy session.  Session focus on gait training with RW, activity tolerance, and dynamic standing balance.    Gait training to/from therapy gym with overall steady assist with periods of supervision with RW.  Intermittent min verbal cues for slowing face, increased RLE step length, and walker positioning.  Dynamic standing balance in therapy gym reaching R/L for horse shoes and retrieving with long handled reacher.  PT provided close supervision for dynamic standing balance, increase to steady assist with reaching outside BOS, and mod verbal cues for sustained attention to task.    Pt returned to room at end of session and positioned in bed with supervision, call bell in reach and needs met.   Therapy Documentation Precautions:  Precautions Precautions: Fall Precaution Comments: L ankle edema and pain with AROM and weight bearing Required Braces or Orthoses: Other Brace/Splint Other Brace/Splint: ASO  Restrictions Weight Bearing Restrictions: No   See Function Navigator for Current Functional Status.   Therapy/Group: Individual Therapy  Earnest Conroy Penven-Crew 09/01/2015, 3:05 PM

## 2015-09-01 NOTE — Progress Notes (Signed)
RT placed patient on  CPAP of 8. Patient tolerating well. No O2 BLleed in needed.

## 2015-09-01 NOTE — Progress Notes (Signed)
Speech Language Pathology Daily Session Note  Patient Details  Name: David [name] MRN: [id number] Date of Birth: [birth year]  Today's Date: 09/01/2015 SLP Individual Time: 0900-1000 SLP Individual Time Calculation (min): 60 min  Short Term Goals: Week 1: SLP Short Term Goal 1 (Week 1): Pt demonstrate >75% acc with Y/N?s with min A. SLP Short Term Goal 2 (Week 1): Pt demonstrate single word reading comprehension with min A. SLP Short Term Goal 3 (Week 1): Pt demonstrate object naming >75% with mod A. SLP Short Term Goal 4 (Week 1): Pt communicate basic needs/wants with mod A. SLP Short Term Goal 5 (Week 1): Pt demonstrate O x4 with mod A. SLP Short Term Goal 6 (Week 1): Pt tolerate regular diet at mod I.  Skilled Therapeutic Interventions: Skilled treatment session focused on cognitive-linguistic goals. SLP facilitated session by providing extra time and Min A phonemic cues to complete familiar automatic sequences and Mod A multimodal cues to match an object to a written word from a field of 2. However, once the written aid was removed, patient was able to identify 1/8 objects with Max A multimodal cues.  Patient performed a basic matching task with Mod I and required Max A multimodal cues to name 3/9 colors. Patient left upright in wheelchair with all needs within reach. Continue with current plan of care.    Cognition Comprehension Comprehension assist level: Understands basic 50 - 74% of the time/ requires cueing 25 - 49% of the time  Expression   Expression assist level: Expresses basis less than 25% of the time/requires cueing >75% of the time.  Social Interaction Social Interaction assist level: Interacts appropriately 50 - 74% of the time - May be physically or verbally inappropriate.  Problem Solving Problem solving assist level: Solves basic 25 - 49% of the time - needs direction more than half the time to initiate, plan or complete simple activities  Memory Memory assist level:  Recognizes or recalls 25 - 49% of the time/requires cueing 50 - 75% of the time    Pain No/Denies Pain  Therapy/Group: Individual Therapy  Jessiah Steinhart 09/01/2015, 4:09 PM

## 2015-09-01 NOTE — Progress Notes (Signed)
Occupational Therapy Session Note  Patient Details  Name: David [name] MRN: [id number] Date of Birth: 08/22/1940  Today's Date: 09/01/2015 OT Individual Time: [phone] and 1017-5102 OT Individual Time Calculation (min): 59 min and 56 min    Short Term Goals: Week 1:  OT Short Term Goal 1 (Week 1): Pt will perform toilet transfer with min A in order to decrease level of assist needed for functional transfers.  OT Short Term Goal 2 (Week 1): Pt will engage in 15 minutes of functional activity with 2 or less rest breaks secondary to fatigue. OT Short Term Goal 3 (Week 1): Pt will perform LB dressing with mod A in order to decrease level of assist with functional tasks.  OT Short Term Goal 4 (Week 1): Pt will perform shower transfer with min A in order to decrease level of assist for functional transfers.   Skilled Therapeutic Interventions/Progress Updates:    Session 1: Upon entering the room, pt seated in wheelchair awaiting therapist. Pt with no c/o pain this session. Pt needing max multimodal cues this session in order to sequence, problem solve, attend, and initiate functional tasks. Pt ambulated to shower with min A and use of RW. Pt engaged in bathing from TTB with min A for sit <>stand in order to clean buttocks. Pt displaying perseveration during shower as well with washing face. Pt exited the shower and dressed from EOB with min A for balance with LB clothing management. Pt transferred to wheelchair at end of session with quick release belt donned and call bell within reach upon exiting the room.   Session 2: Upon entering the room, pt supine in bed with wife and sister present in the room. OT discussing discharge recommendations with family present in the room in order to confirm current set up at home. OT recommended shower chair for home use and pt's sister vebalizing she would provide for pt. Pt performed supine >sit with supervision to EOB. Pt standing and ambulating with min A into  bathroom with use of RW for toileting. Pt performed clothing management with max multimodal cues. Pt unable to void this session. He is also currently receiving IV fluids with therapist managing IV during ambulation. Pt ambulated 60' into dayroom for seated aerobic exercises. Pt needing frequent rest breaks secondary to fatigue and decreased activity tolerance. Pt then ambulating back to room in same manner. Pt seated on EOB with family present in the room as pt will be transitioning to next therapy session as therapist exits.   Therapy Documentation Precautions:  Precautions Precautions: Fall Precaution Comments: L ankle edema and pain with AROM and weight bearing Required Braces or Orthoses: Other Brace/Splint Other Brace/Splint: ASO  Restrictions Weight Bearing Restrictions: No   Pain: Pain Assessment Pain Assessment: No/denies pain  See Function Navigator for Current Functional Status.   Therapy/Group: Individual Therapy  [name] 09/01/2015, 12:51 PM

## 2015-09-02 ENCOUNTER — Inpatient Hospital Stay (HOSPITAL_COMMUNITY): Payer: Medicare Other | Admitting: Speech Pathology

## 2015-09-02 ENCOUNTER — Inpatient Hospital Stay (HOSPITAL_COMMUNITY): Payer: Medicare Other | Admitting: Occupational Therapy

## 2015-09-02 ENCOUNTER — Inpatient Hospital Stay (HOSPITAL_COMMUNITY): Payer: Medicare Other | Admitting: Physical Therapy

## 2015-09-02 ENCOUNTER — Encounter (HOSPITAL_COMMUNITY): Payer: Self-pay | Admitting: Physical Medicine and Rehabilitation

## 2015-09-02 DIAGNOSIS — R918 Other nonspecific abnormal finding of lung field: Secondary | ICD-10-CM

## 2015-09-02 DIAGNOSIS — C799 Secondary malignant neoplasm of unspecified site: Secondary | ICD-10-CM

## 2015-09-02 LAB — BASIC METABOLIC PANEL
ANION GAP: 8 (ref 5–15)
BUN: 40 mg/dL — AB (ref 6–20)
CALCIUM: 9 mg/dL (ref 8.9–10.3)
CO2: 23 mmol/L (ref 22–32)
Chloride: 104 mmol/L (ref 101–111)
Creatinine, Ser: 1.3 mg/dL — ABNORMAL HIGH (ref 0.61–1.24)
GFR calc Af Amer: 60 mL/min — ABNORMAL LOW (ref >60)
GFR calc non Af Amer: 52 mL/min — ABNORMAL LOW (ref >60)
GLUCOSE: 103 mg/dL — AB (ref 65–99)
Potassium: 4.5 mmol/L (ref 3.5–5.1)
Sodium: 135 mmol/L (ref 135–145)

## 2015-09-02 MED ORDER — BETHANECHOL CHLORIDE 25 MG PO TABS: 25.0000 mg | ORAL_TABLET | Freq: Three times a day (TID) | ORAL | Status: DC

## 2015-09-02 MED ORDER — BETHANECHOL CHLORIDE 25 MG PO TABS
25.0000 mg | ORAL_TABLET | Freq: Three times a day (TID) | ORAL | Status: DC
  Administered 2015-09-02 – 2015-09-03 (×3): 25 mg via ORAL
  Filled 2015-09-02 (×3): qty 1

## 2015-09-02 NOTE — Progress Notes (Signed)
When going to I&O cath patient, RN noticed head of penis to be dark in color, and swollen/blister like skin surrounding head of penis. I&O cath with 16Fr Coude cath with no resistance, or pain. Lidocaine gel used. Patient saw the area, but denies any pain. RN assessed with oncoming RN to continue to monitor. Kennieth Francois, RN

## 2015-09-02 NOTE — Progress Notes (Signed)
Occupational Therapy Weekly Progress Note  Patient Details  Name: David [name] MRN: [id number] Date of Birth: October 07, 1940  Beginning of progress report period: Aug 27, 2015 End of progress report period: Sep 02, 2015  Today's Date: 09/02/2015 OT Individual Time: 2585-2778 OT Individual Time Calculation (min): 72 min    Patient has met 4 of 4 short term goals. Pt making steady progress towards occupational therapy goals. Pt requires supervision - min A for self care tasks with use of RW. Pt currently needing mod - max multimodal cues during functional tasks.   Patient continues to demonstrate the following deficits: decreased I in self care, decreased balance, decreased cognition, problem solving, initiation, sequencing, and safety awareness and therefore will continue to benefit from skilled OT intervention to enhance overall performance with BADL and iADL.  Patient progressing toward long term goals..  Continue plan of care.  OT Short Term Goals Week 1:  OT Short Term Goal 1 (Week 1): Pt will perform toilet transfer with min A in order to decrease level of assist needed for functional transfers.  OT Short Term Goal 1 - Progress (Week 1): Met OT Short Term Goal 2 (Week 1): Pt will engage in 15 minutes of functional activity with 2 or less rest breaks secondary to fatigue. OT Short Term Goal 2 - Progress (Week 1): Met OT Short Term Goal 3 (Week 1): Pt will perform LB dressing with mod A in order to decrease level of assist with functional tasks.  OT Short Term Goal 3 - Progress (Week 1): Met OT Short Term Goal 4 (Week 1): Pt will perform shower transfer with min A in order to decrease level of assist for functional transfers.  OT Short Term Goal 4 - Progress (Week 1): Met Week 2:  OT Short Term Goal 1 (Week 2): STGs=LTGs secondary to upcoming discharge  Skilled Therapeutic Interventions/Progress Updates:    Upon entering the room, pt supine in bed with no c/o pain this session. Pt  agreeable to OT intervention. Pt performed supine >sit with supervision to EOB. Pt declined shower but changed clothing while seated on EOB. Pt required mod multimodal cues for initiation and sequencing of dressing tasks along with increased time. Pt donning LB clothing with min A for standing balance. Pt verbalized need for toileting. Pt ambulating with steady assist and RW into bathroom. Pt able to perform clothing management and hygiene with max multimodal cues and steady assistance. Pt having BM and wiping self with wash cloth and attempting to flush down toilet. Pt returned to wheelchair with quick release belt donned and pt washing hands before eating meal. OT provided supervision for meal with min cues for smaller bites. Call bell and all needed items within reach upon exiting the room.   Therapy Documentation Precautions:  Precautions Precautions: Fall Precaution Comments: L ankle edema and pain with AROM and weight bearing Required Braces or Orthoses: Other Brace/Splint Other Brace/Splint: ASO  Restrictions Weight Bearing Restrictions: No General:   Vital Signs: Therapy Vitals Temp: 97.4 F (36.3 C) Temp Source: Oral Pulse Rate: 91 Resp: 18 BP: 128/81 mmHg Patient Position (if appropriate): Lying Oxygen Therapy SpO2: 93 % O2 Device: CPAP FiO2 (%): 21 %  See Function Navigator for Current Functional Status.   Therapy/Group: Individual Therapy  [name] 09/02/2015, 8:02 AM

## 2015-09-02 NOTE — Consult Note (Signed)
Name: Torence Palmeri MRN: [id number] DOB: 1940-08-10    ADMISSION DATE:  08/26/2015 CONSULTATION DATE:  09/02/15  REFERRING MD :  Dr. 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     During his initial hospitalization, he was evaluated with carotid dopplers (without significant ICA stenosis), ECHO with EF 65-70% with grade one diastolic dysfunction and follow up CTA that showed a stable large left frontal hematoma.  Speech therapy evaluation revealed global aphasia receptive > expressive deficits.  He was also noted to have a left lung mass on CXR but due to renal insufficiency, CT with contrast was delayed.  He was discharged to [facility] on 5/18 for further PT efforts. While on rehab, CT of the chest was evaluated which showed a 2.8 x 2.5 x 2.1 cm well-circumscribed mass in the left upper lobe in the lingular region and an additional 6 mm posterior lateral nodule in the LUL.    PCCM consulted for nodule evaluation.  The patient currently denies shortness of breath, chest pain, pain with inspiration, cough, fevers, chills, sputum production, decreased appetite and weight loss.    Of note, patients daughter is Dr. [name] Leggitt  PAST MEDICAL HISTORY :   has a past medical history of Hypertension; Sleep apnea; High cholesterol; Renal disorder; Pacemaker; and Rectal bleed.  has past surgical history that includes Cardiac surgery; Knee arthroscopy (12/2008); and Foot surgery (08/2010).   Prior to Admission medications   Medication Sig Start Date End Date Taking? Authorizing Provider    carvedilol (COREG) 3.125 MG tablet Take 3.125 mg by mouth 2 (two) times daily with a meal.    Historical Provider, MD  levothyroxine (SYNTHROID, LEVOTHROID) 125 MCG tablet Take 125 mcg by mouth daily before breakfast.    Historical Provider, MD  lisinopril (PRINIVIL,ZESTRIL) 5 MG tablet Take 5 mg by mouth 2 (two) times daily.    Historical Provider, MD  Multiple Vitamin (MULTIVITAMIN WITH MINERALS) TABS tablet Take 1 tablet by mouth daily.    Historical Provider, MD  naproxen sodium (ALEVE) 220 MG tablet Take 220 mg by mouth 2 (two) times daily as needed (pain).    Historical Provider, MD  PRESCRIPTION MEDICATION Inhale into the lungs at bedtime. CPAP    Historical Provider, MD  rosuvastatin (CRESTOR) 10 MG tablet Take 10 mg by mouth at bedtime.    Historical Provider, MD  sertraline (ZOLOFT) 100 MG tablet Take 100 mg by mouth daily.    Historical Provider, MD  Turmeric Curcumin 500 MG CAPS Take 500 mg by mouth 2 (two) times daily.    Historical Provider, MD   No Known Allergies  FAMILY HISTORY:  family history includes Cancer in his mother; Heart attack in his father; Hypertension in his father and mother; Rheum arthritis in his sister.   SOCIAL HISTORY:  reports that he has never smoked. He does not have any smokeless tobacco history on file. He reports that he drinks alcohol. He reports that he does not use illicit drugs.  REVIEW OF SYSTEMS:   Constitutional: Negative for fever, chills, weight loss,  malaise/fatigue and diaphoresis.  HENT: Negative for hearing loss, ear pain, nosebleeds, congestion, sore throat, neck pain, tinnitus and ear discharge.   Eyes: Negative for blurred vision, double vision, photophobia, pain, discharge and redness.  Respiratory: Negative for cough, hemoptysis, sputum production, shortness of breath, wheezing and stridor.   Cardiovascular: Negative for chest pain, palpitations, orthopnea, claudication, leg swelling and PND.  Gastrointestinal: Negative for  heartburn, nausea, vomiting, abdominal pain, diarrhea, constipation, blood in stool and melena.  Genitourinary: Negative for dysuria, urgency, frequency, hematuria and flank pain.  Musculoskeletal: Negative for myalgias, back pain, joint pain and falls.  Skin: Negative for itching and rash.  Neurological: Negative for dizziness, tingling, tremors, sensory change, speech change, focal weakness, seizures, loss of consciousness, weakness and headaches.  Endo/Heme/Allergies: Negative for environmental allergies and polydipsia. Does not bruise/bleed easily.  SUBJECTIVE:   VITAL SIGNS: Temp:  [97.4 F (36.3 C)-98.2 F (36.8 C)] 97.4 F (36.3 C) (05/25 0510) Pulse Rate:  [76-91] 91 (05/25 0510) Resp:  [18-20] 18 (05/25 0510) BP: (116-128)/(76-81) 128/81 mmHg (05/25 0510) SpO2:  [93 %-100 %] 93 % (05/25 0510) FiO2 (%):  [21 %] 21 % (05/25 0510) Weight:  [202 lb 1.6 oz (91.672 kg)] 202 lb 1.6 oz (91.672 kg) (05/25 0510)  PHYSICAL EXAMINATION: General:  Well developed elderly male in NAD  Neuro:  Awake, alert, answers yes / no questions appropriately, follows commands, expressive aphasia  HEENT:  MM pink/moist, no jvd  Cardiovascular:  s1s2 rrr, no m/r/g Lungs:  Even/non-labored, lungs bilaterally clear  Abdomen:  Obese/soft, bsx4 active  Musculoskeletal:  No acute deformities  Skin:  Warm/dry, no edema    Recent Labs Lab 08/31/15 0440 09/01/15 0700 09/02/15 0556  NA 135 135 135  K 4.4 4.4 4.5  CL 103 104 104  CO2 22 21* 23  BUN 46* 42* 40*  CREATININE 1.26* 1.16 1.30*  GLUCOSE 96 100* 103*    Recent Labs Lab 08/27/15 1207 08/28/15 2000 08/29/15 0454  HGB 15.3 14.2 13.0  HCT 44.9 41.3 38.7*  WBC 10.2 7.3 6.7  PLT 208 240 232   Ct Chest W Contrast  09/01/2015  CLINICAL DATA:  Left lower lobe mass on previous chest radiography. EXAM: CT CHEST WITH CONTRAST TECHNIQUE: Multidetector CT imaging of the chest was performed during intravenous contrast administration. CONTRAST:   82m ISOVUE-300 IOPAMIDOL (ISOVUE-300) INJECTION 61% COMPARISON:  Radiography 08/23/2015 FINDINGS: Within the left upper lobe in the lingular region, there is a well-circumscribed mass measuring 2.8 x 2.5 x 2.1 cm. This was not described on a previous study from 2000 for as therefore worrisome for well-circumscribed malignancy. This is not spiculated. Does not appear to extend to the pleura. No second lesion identified in the left lung. In the right lung, in the upper lobe posterior laterally, there is a 6 mm well-circumscribed nodule. Otherwise, the lung parenchyma is clear. There is not a background pattern of emphysema. No pleural fluid. No pericardial fluid. The patient has had previous median sternotomy and CABG. Pacemaker is in place. There is atherosclerosis of the aorta but no aneurysm or dissection. No mediastinal or hilar mass or lymphadenopathy. Scans in the upper abdomen do not show any significant finding. No adrenal mass. Ordinary degenerative changes affect the spine. IMPRESSION: 2.8 x 2.5 x 2.1 cm well-circumscribed mass in the left upper lobe in the lingular region, not described on a CT of 2004. This is likely to represent a malignancy. 6 mm nodule posterior laterally in the left upper lobe, image 35.  This is nonspecific and could be a benign nodule, a second primary or a metastasis. No lymphadenopathy. PET scan may be useful in evaluating these 2 areas. Electronically Signed   By: Nelson Chimes M.D.   On: 09/01/2015 20:09    SIGNIFICANT EVENTS  5/18  Admit to CIR for rehab post ICH  STUDIES:     ASSESSMENT / PLAN:  Pulmonary Nodules - two LUL nodules identified on CT, largest 2.8 x 2.5 x 2.1 cm concerning for malignancy.    Plan: Obtain Super-D images of most recent CT (verifed images available with CT, will need to be picked up) Will discuss case with Dr. Lamonte Sakai for possible ENB vs CT guided needle biopsy of LUL for best approach Will need outpatient PET scan  Arrange for follow  up with Dr. Lamonte Sakai in office (See d/c section).  Will need outpatient PET scan before seeing Dr. Lamonte Sakai.   Wife appropriately concerned that now (in the middle of CVA rehab) is not the best timing to biopsy the nodule.  She is requesting that we update her daughter - Dr. [name] Leggitt (OBGYN in [place] community)      Noe Gens, NP-C [place] Pgr: [phone] or if no answer [phone] 09/02/2015, 12:27 PM  Attending Note:  75 year old male with PMH of melanoma who suffered a recent stroke and is now in rehab.  CT of the chest was done with homogenous mass in the LUL.  PCCM called for consultation.  On exam, lungs are clear.  I reviewed CT myself, LUL lung mass.  Discussed with Dr. Lamonte Sakai and PCCM NP.  Lung Mass: peripheral, sub pleural.  I spoke with the patient's daughter who is a Field seismologist.  Recommend completion of rehab then f/u with a PET scan as outpatient then f/u with Dr. Lamonte Sakai.  I am concerned that this is metastasis of the melanoma therefore PET scan is essential.  Super D images produced.  Arranged appointment on June 29, but office will arrange PET scan then move appointment up sooner.   PCCM will sign off, please call back if needed.  Patient seen and examined, agree with above note.  I dictated the care and orders written for this patient under my direction.  Rush Farmer, MD [phone]

## 2015-09-02 NOTE — Progress Notes (Signed)
Speech Language Pathology Daily Session Note  Patient Details  Name: David [name] MRN: [id number] Date of Birth: [birth year]  Today's Date: 09/02/2015 SLP Individual Time: 1030-1100 SLP Individual Time Calculation (min): 30 min  Short Term Goals: Week 1: SLP Short Term Goal 1 (Week 1): Pt demonstrate >75% acc with Y/N?s with min A. SLP Short Term Goal 2 (Week 1): Pt demonstrate single word reading comprehension with min A. SLP Short Term Goal 3 (Week 1): Pt demonstrate object naming >75% with mod A. SLP Short Term Goal 4 (Week 1): Pt communicate basic needs/wants with mod A. SLP Short Term Goal 5 (Week 1): Pt demonstrate O x4 with mod A. SLP Short Term Goal 6 (Week 1): Pt tolerate regular diet at mod I.  Skilled Therapeutic Interventions: Skilled treatment session focused on cognitive-linguistic and dysphagia goals. SLP facilitated session by providing max multi-modal cues for singe word reading with 20% accuracy this session. Pt with 25% accuracy with basic yes/no questions with mod to max A verbal cues. Pt able to match object to word in field of two with max multi-modal cues for 40% accuracy. Pt was Mod I with regular snack for cognitive retention of bite size and rate. Pt left upright in chair with all needs within reach. Continue plan of care.   Function:   Cognition Comprehension Comprehension assist level: Understands basic 50 - 74% of the time/ requires cueing 25 - 49% of the time  Expression   Expression assist level: Expresses basic 25 - 49% of the time/requires cueing 50 - 75% of the time. Uses single words/gestures.  Social Interaction Social Interaction assist level: Interacts appropriately 50 - 74% of the time - May be physically or verbally inappropriate.  Problem Solving Problem solving assist level: Solves basic less than 25% of the time - needs direction nearly all the time or does not effectively solve problems and may need a restraint for safety  Memory Memory assist  level: Recognizes or recalls 25 - 49% of the time/requires cueing 50 - 75% of the time    Pain    Therapy/Group: Individual Therapy  Rickiya Picariello 09/02/2015, 12:24 PM

## 2015-09-02 NOTE — Progress Notes (Signed)
Physical Therapy Weekly Progress Note  Patient Details  Name: David [name] MRN: [id number] Date of Birth: 1940-08-09  Beginning of progress report period: Aug 26, 2015 End of progress report period: Sep 02, 2015  Today's Date: 09/02/2015 PT Individual Time: 0920-1017 PT Individual Time Calculation (min): 57 min   Patient has met 4 of 4 short term goals.  Pt has made excellent progress with his mobility, but continues to require intermittent steady assist and fluctuates with need for mod to max verbal cues.  Pt demonstrates poor frustration tolerance, which presents challenges with communication and mobility.    Patient continues to demonstrate the following deficits: balance, initiation, and cognition and therefore will continue to benefit from skilled PT intervention to enhance overall performance with activity tolerance, balance, awareness and coordination.  Patient progressing toward long term goals..  Continue plan of care.  PT Short Term Goals Week 1:  PT Short Term Goal 1 (Week 1): Pt will transfer with consistent min assist and LRAD PT Short Term Goal 1 - Progress (Week 1): Met PT Short Term Goal 2 (Week 1): Pt will amb 50' with min assist and LRAD PT Short Term Goal 2 - Progress (Week 1): Met PT Short Term Goal 3 (Week 1): Pt will propel w/c 150' with supervision PT Short Term Goal 3 - Progress (Week 1): Met PT Short Term Goal 4 (Week 1): Pt will negotiate 4 steps with 1 rail and min assist PT Short Term Goal 4 - Progress (Week 1): Met Week 2:  PT Short Term Goal 1 (Week 2): =LTGs  Skilled Therapeutic Interventions/Progress Updates:    Pt received resting in w/c with no c/o pain and agreeable to therapy session.  Session focus on gait training, problem solving, communication, and functional tasks.    Pt transfers sit<>stand and stand/pivot with RW with close supervision and verbal cues for hand placement and multimodal cues for initiation.    Gait training to therapy gym  with RW and close supervision.  Pt demonstrates appropriate walker positioning and requires min verbal cues for R step length.  PT instructed pt in obstacle course x2 trials with min assist progress to supervision and mod progress to min verbal cues for navigating over and around obstacles.    PT instructed pt in dynamic standing task washing dishes and putting away in kitchen with supervision and mod/max verbal cues for sequencing of familiar functional task.    Pt returned to room in w/c at end of session and positioned upright with call bell in reach and needs met.   Therapy Documentation Precautions:  Precautions Precautions: Fall Precaution Comments: L ankle edema and pain with AROM and weight bearing Required Braces or Orthoses: Other Brace/Splint Other Brace/Splint: ASO  Restrictions Weight Bearing Restrictions: No   See Function Navigator for Current Functional Status.  Therapy/Group: Individual Therapy  Earnest Conroy Penven-Crew 09/02/2015, 12:19 PM

## 2015-09-02 NOTE — Progress Notes (Addendum)
Subjective/Complaints: Pt laying in bed this AM.  He is able to tell me the date and where he is with options.  Family conference with daughter as well.     Review systems limited by aphasia and cognition  Objective: Vital Signs: Blood pressure 128/81, pulse 91, temperature 97.4 F (36.3 C), temperature source Oral, resp. rate 18, height 5' 9" (1.753 m), weight 91.672 kg (202 lb 1.6 oz), SpO2 93 %. Ct Chest W Contrast  09/01/2015  CLINICAL DATA:  Left lower lobe mass on previous chest radiography. EXAM: CT CHEST WITH CONTRAST TECHNIQUE: Multidetector CT imaging of the chest was performed during intravenous contrast administration. CONTRAST:  34m ISOVUE-300 IOPAMIDOL (ISOVUE-300) INJECTION 61% COMPARISON:  Radiography 08/23/2015 FINDINGS: Within the left upper lobe in the lingular region, there is a well-circumscribed mass measuring 2.8 x 2.5 x 2.1 cm. This was not described on a previous study from 2000 for as therefore worrisome for well-circumscribed malignancy. This is not spiculated. Does not appear to extend to the pleura. No second lesion identified in the left lung. In the right lung, in the upper lobe posterior laterally, there is a 6 mm well-circumscribed nodule. Otherwise, the lung parenchyma is clear. There is not a background pattern of emphysema. No pleural fluid. No pericardial fluid. The patient has had previous median sternotomy and CABG. Pacemaker is in place. There is atherosclerosis of the aorta but no aneurysm or dissection. No mediastinal or hilar mass or lymphadenopathy. Scans in the upper abdomen do not show any significant finding. No adrenal mass. Ordinary degenerative changes affect the spine. IMPRESSION: 2.8 x 2.5 x 2.1 cm well-circumscribed mass in the left upper lobe in the lingular region, not described on a CT of 2004. This is likely to represent a malignancy. 6 mm nodule posterior laterally in the left upper lobe, image 35. This is nonspecific and could be a benign nodule,  a second primary or a metastasis. No lymphadenopathy. PET scan may be useful in evaluating these 2 areas. Electronically Signed   By: MNelson ChimesM.D.   On: 09/01/2015 20:09   Results for orders placed or performed during the hospital encounter of 08/26/15 (from the past 72 hour(s))  Basic metabolic panel     Status: Abnormal   Collection Time: 08/31/15  4:40 AM  Result Value Ref Range   Sodium 135 135 - 145 mmol/L   Potassium 4.4 3.5 - 5.1 mmol/L   Chloride 103 101 - 111 mmol/L   CO2 22 22 - 32 mmol/L   Glucose, Bld 96 65 - 99 mg/dL   BUN 46 (H) 6 - 20 mg/dL   Creatinine, Ser 1.26 (H) 0.61 - 1.24 mg/dL   Calcium 9.0 8.9 - 10.3 mg/dL   GFR calc non Af Amer 54 (L) >60 mL/min   GFR calc Af Amer >60 >60 mL/min    Comment: (NOTE) The eGFR has been calculated using the CKD EPI equation. This calculation has not been validated in all clinical situations. eGFR's persistently <60 mL/min signify possible Chronic Kidney Disease.    Anion gap 10 5 - 15  Basic metabolic panel     Status: Abnormal   Collection Time: 09/01/15  7:00 AM  Result Value Ref Range   Sodium 135 135 - 145 mmol/L   Potassium 4.4 3.5 - 5.1 mmol/L   Chloride 104 101 - 111 mmol/L   CO2 21 (L) 22 - 32 mmol/L   Glucose, Bld 100 (H) 65 - 99 mg/dL   BUN 42 (H)  6 - 20 mg/dL   Creatinine, Ser 1.16 0.61 - 1.24 mg/dL   Calcium 9.1 8.9 - 10.3 mg/dL   GFR calc non Af Amer 59 (L) >60 mL/min   GFR calc Af Amer >60 >60 mL/min    Comment: (NOTE) The eGFR has been calculated using the CKD EPI equation. This calculation has not been validated in all clinical situations. eGFR's persistently <60 mL/min signify possible Chronic Kidney Disease.    Anion gap 10 5 - 15  Basic metabolic panel     Status: Abnormal   Collection Time: 09/02/15  5:56 AM  Result Value Ref Range   Sodium 135 135 - 145 mmol/L   Potassium 4.5 3.5 - 5.1 mmol/L   Chloride 104 101 - 111 mmol/L   CO2 23 22 - 32 mmol/L   Glucose, Bld 103 (H) 65 - 99 mg/dL    BUN 40 (H) 6 - 20 mg/dL   Creatinine, Ser 1.30 (H) 0.61 - 1.24 mg/dL   Calcium 9.0 8.9 - 10.3 mg/dL   GFR calc non Af Amer 52 (L) >60 mL/min   GFR calc Af Amer 60 (L) >60 mL/min    Comment: (NOTE) The eGFR has been calculated using the CKD EPI equation. This calculation has not been validated in all clinical situations. eGFR's persistently <60 mL/min signify possible Chronic Kidney Disease.    Anion gap 8 5 - 15    Gen. no acute distress. Vital signs reviewed. Well-developed, well-nourished. HENT: Normocephalic atraumatic Eyes: Conjunctivae and EOM normal Cardio: RRR and No murmur Resp: CTA B/L and unlabored GI: BS positive and nondistended nontender Musc/Skel: No edema, no tenderness noted. Neuro:  Global aphasia, Expressive >> receptive Alert and oriented x3 with increased time and options Able to perform demonstrated commands for the most part Motor: 4+/5 in the right deltoid, biceps, triceps, grip, hip flexor, knee extensor, ankle dorsiflex and plantar flexor.  5/5 on the left side in the same muscle groups., No ataxia. Skin:   Intact. Warm and dry.  Assessment/Plan: 1. Functional deficits secondary to Left frontal intracranial hypertensive hemorrhage  which require 3+ hours per day of interdisciplinary therapy in a comprehensive inpatient rehab setting. Physiatrist is providing close team supervision and 24 hour management of active medical problems listed below. Physiatrist and rehab team continue to assess barriers to discharge/monitor patient progress toward functional and medical goals. FIM: Function - Bathing Bathing activity did not occur: Refused Position: Shower Body parts bathed by patient: Right arm, Left arm, Chest, Abdomen, Front perineal area, Right upper leg, Left upper leg, Buttocks Body parts bathed by helper: Right lower leg, Left lower leg Bathing not applicable: Back Assist Level: Touching or steadying assistance(Pt > 75%)  Function- Upper Body  Dressing/Undressing What is the patient wearing?: Pull over shirt/dress Pull over shirt/dress - Perfomed by patient: Thread/unthread right sleeve, Thread/unthread left sleeve, Put head through opening, Pull shirt over trunk Assist Level: Set up Set up : To obtain clothing/put away Function - Lower Body Dressing/Undressing What is the patient wearing?: Pants, Underwear Position: Sitting EOB Underwear - Performed by patient: Thread/unthread right underwear leg, Thread/unthread left underwear leg, Pull underwear up/down Pants- Performed by patient: Thread/unthread left pants leg, Thread/unthread right pants leg, Pull pants up/down, Fasten/unfasten pants Pants- Performed by helper: Pull pants up/down, Thread/unthread right pants leg Non-skid slipper socks- Performed by patient: Don/doff right sock, Don/doff left sock Non-skid slipper socks- Performed by helper: Don/doff right sock, Don/doff left sock Assist for footwear: Supervision/touching assist Assist for lower  body dressing: Touching or steadying assistance (Pt > 75%)  Function - Toileting Toileting activity did not occur: No continent bowel/bladder event Toileting steps completed by patient: Adjust clothing prior to toileting, Performs perineal hygiene, Adjust clothing after toileting Toileting steps completed by helper: Adjust clothing prior to toileting, Performs perineal hygiene, Adjust clothing after toileting Assist level: Touching or steadying assistance (Pt.75%)  Function - Toilet Transfers Toilet transfer activity did not occur: Refused Toilet transfer assistive device: Grab bar, Walker, Elevated toilet seat/BSC over toilet Assist level to toilet: Touching or steadying assistance (Pt > 75%) Assist level from toilet: Touching or steadying assistance (Pt > 75%) Assist level to bedside commode (at bedside): 2 helpers (per Phebe Colla, NT) Assist level from bedside commode (at bedside): 2 helpers  Function - Chair/bed  transfer Chair/bed transfer method: Stand pivot, Ambulatory Chair/bed transfer assist level: Touching or steadying assistance (Pt > 75%) Chair/bed transfer assistive device: Armrests, Walker Chair/bed transfer details: Verbal cues for sequencing, Verbal cues for technique, Tactile cues for posture, Tactile cues for weight bearing  Function - Locomotion: Wheelchair Will patient use wheelchair at discharge?: No Type: Manual Max wheelchair distance: 100 Assist Level: Supervision or verbal cues Assist Level: Supervision or verbal cues Wheel 150 feet activity did not occur: Safety/medical concerns Assist Level: Supervision or verbal cues Turns around,maneuvers to table,bed, and toilet,negotiates 3% grade,maneuvers on rugs and over doorsills: No Function - Locomotion: Ambulation Assistive device: Walker-rolling, No device Max distance: 105 Assist level: Touching or steadying assistance (Pt > 75%) Assist level: Supervision or verbal cues Assist level: Supervision or verbal cues Walk 150 feet activity did not occur: Safety/medical concerns Assist level: Touching or steadying assistance (Pt > 75%) Walk 10 feet on uneven surfaces activity did not occur: Safety/medical concerns  Function - Comprehension Comprehension: Auditory Comprehension assist level: Understands basic 75 - 89% of the time/ requires cueing 10 - 24% of the time  Function - Expression Expression: Verbal Expression assist level: Expresses basic 25 - 49% of the time/requires cueing 50 - 75% of the time. Uses single words/gestures.  Function - Social Interaction Social Interaction assist level: Interacts appropriately 50 - 74% of the time - May be physically or verbally inappropriate.  Function - Problem Solving Problem solving assist level: Solves basic 25 - 49% of the time - needs direction more than half the time to initiate, plan or complete simple activities  Function - Memory Memory assist level: Recognizes or  recalls 25 - 49% of the time/requires cueing 50 - 75% of the time Patient normally able to recall (first 3 days only): That he or she is in a hospital, Location of own room  Medical Problem List and Plan: 1.  Right hemiparesis, aphasia, apraxia and cognitive deficits secondary to Left frontal intracranial hypertensive hemorrhage  Continue CIR 2.  DVT Prophylaxis/Anticoagulation: Mechanical: Sequential compression devices, below knee Bilateral lower extremities 3. Pain Management: tylenol prn,   left ankle pain suspect sprain, x-ray reviewed, showing avulsed bony fragment distal tibia medial malleolus of indeterminate age. Continue Aircast, weightbearing as tolerated 4. Mood:  Seems frustrated due to expressive/receptive deficits. Team to provide ego support. LCSW to follow for evaluation and support.   5. Neuropsych: This patient is not capable of making decisions on his own behalf. 6. Skin/Wound Care: routine pressure relief measures 7. Fluids/Electrolytes/Nutrition:  Monitor I/O.   BMP stable on 5/25 8.  CAD s/p CABG: On coreg and crestor 9. Labile BP: Monitor BP. Lisinoprildiscontinued and Coreg dose down titrated.  Improved  with IVF, [place] on 5/22 10. CKD: BUN/Creatinine 32/1.5 at admission and at baseline.     Creatinine 1.30 on 5/25  IVF started for CT, will plan to d/c tomorrow  Will continue to monitor  Renal ultrasound reviewed suggesting medical renal disease disease  Continue to Encourage fluid intake.   11. Depression: Continue zoloft daily, increased on 5/22.   12. Hypothyroid: Resumed supplement.   13. LLL mass: CT chest reviewed from 5/24, suggesting potential malignancy.  Will follow up with Heme/Onc 14. Incontinence: Likely due to cognitive and processing issues--UA essentially neg except for some hematuria 15. Urinary retention  Urine culture negative on 5/21  Renal U/S reviewed suggesting medical renal disease, no obstruction.  Bethanechol 5 mg started 5/23, increased  to 10 on 5/24, increased to 25 on 5/25. Cont to monitor 16. BPH: Cont Uroxatral 17. OSA : Continue CPAP  Total of 1 hour spent with pt and family, 6mn of counseling  LOS (Days) 7 A FACE TO FACE EVALUATION WAS PERFORMED  Ankit ALorie Phenix5/25/2017, 9:16 AM   Diarrhea

## 2015-09-02 NOTE — Consult Note (Signed)
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 He was found to have left frontal hemorrhage 5.7 cm x 4.5 cm with 6 mm left to right shift.During the workup for his stroke, a left upper lung mass was noticed on the CT scan. I was called to evaluate his lung mass.  Patient has aphasia, not able to communicate much.  According to his daughter, Dr. Gala [name], who is a OB GYN at [employer], he probably only smoked for very short period time during the college. He did have 3 skin melanoma, which was removed, last one was 2-3 years ago. No other cancer history.  MEDICAL HISTORY:  Past Medical History  Diagnosis Date  . Hypertension   . Sleep apnea     uses CPAP  . High cholesterol   . Renal disorder   . Pacemaker     due to syncope  . Rectal bleed     due to NSAIDS  . Melanoma ([place])     SURGICAL HISTORY: Past Surgical History  Procedure Laterality Date  . Cardiac surgery      bypass X 2  . Knee arthroscopy  12/2008  . Foot surgery  08/2010    SOCIAL HISTORY: Social History   Social History  . Marital Status: Single    Spouse Name: N/A  . Number of Children: N/A  . Years of Education: N/A   Occupational History  . Not on file.   Social History Main Topics  . Smoking status: Never Smoker   . Smokeless tobacco: Not on file  . Alcohol Use: Yes  . Drug Use: No  . Sexual Activity: Not on file   Other Topics Concern  . Not on file   Social History Narrative    FAMILY HISTORY: Family History  Problem Relation Age of Onset  . Cancer Mother     . Hypertension Mother   . Hypertension Father   . Heart attack Father   . Rheum arthritis Sister     ALLERGIES:  has No Known Allergies.  MEDICATIONS:  Current Facility-Administered Medications  Medication Dose Route Frequency Provider Last Rate Last Dose  . 0.9 %  sodium chloride infusion   Intravenous Continuous Ankit Lorie Phenix, MD 75 mL/hr at 09/01/15 1112    . acetaminophen (TYLENOL) tablet 650 mg  650 mg Oral Q4H PRN Bary Leriche, PA-C   650 mg at 08/27/15 1754  . alfuzosin (UROXATRAL) 24 hr tablet 10 mg  10 mg Oral Q breakfast Aleksei Plotnikov V, MD   10 mg at 09/02/15 0816  . alum & mag hydroxide-simeth (MAALOX/MYLANTA) 200-200-20 MG/5ML suspension 30 mL  30 mL Oral Q4H PRN Bary Leriche, PA-C      . bethanechol (URECHOLINE) tablet 25 mg  25 mg Oral TID Ivan Anchors Love, PA-C   25 mg at 09/02/15 1414  . bisacodyl (DULCOLAX) suppository 10 mg  10 mg Rectal Daily PRN Bary Leriche, PA-C   10 mg at 08/31/15 1756  . carvedilol (COREG) tablet 3.125 mg  3.125 mg Oral BID WC Aleksei  Plotnikov V, MD   3.125 mg at 09/02/15 1751  . cloNIDine (CATAPRES) tablet 0.1 mg  0.1 mg Oral Q4H PRN Ivan Anchors Love, PA-C      . guaiFENesin-dextromethorphan (ROBITUSSIN DM) 100-10 MG/5ML syrup 5-10 mL  5-10 mL Oral Q6H PRN Bary Leriche, PA-C      . levothyroxine (SYNTHROID, LEVOTHROID) tablet 125 mcg  125 mcg Oral QAC breakfast Bary Leriche, PA-C   125 mcg at 09/02/15 0600  . lidocaine (XYLOCAINE) 2 % jelly 1 application  1 application Urethral Once Ankit Lorie Phenix, MD      . lidocaine (XYLOCAINE) 2 % jelly 1 application  1 application Urethral PRN Ankit Lorie Phenix, MD      . multivitamin with minerals tablet 1 tablet  1 tablet Oral Daily Bary Leriche, PA-C   1 tablet at 09/02/15 0817  . pantoprazole (PROTONIX) EC tablet 40 mg  40 mg Oral Daily Bary Leriche, PA-C   40 mg at 09/02/15 2595  . prochlorperazine (COMPAZINE) tablet 5-10 mg  5-10 mg Oral Q6H PRN Bary Leriche, PA-C       Or  .  prochlorperazine (COMPAZINE) injection 5-10 mg  5-10 mg Intramuscular Q6H PRN Bary Leriche, PA-C       Or  . prochlorperazine (COMPAZINE) suppository 12.5 mg  12.5 mg Rectal Q6H PRN Bary Leriche, PA-C      . rosuvastatin (CRESTOR) tablet 10 mg  10 mg Oral QHS Ivan Anchors Love, PA-C   10 mg at 09/01/15 2028  . selenium sulfide (SELSUN) 1 % shampoo   Topical Daily Pamela S Love, PA-C      . senna-docusate (Senokot-S) tablet 1 tablet  1 tablet Oral BID Bary Leriche, PA-C   1 tablet at 09/02/15 [phone]  . sertraline (ZOLOFT) tablet 125 mg  125 mg Oral Daily Bary Leriche, PA-C   125 mg at 09/02/15 0816  . sodium phosphate (FLEET) 7-19 GM/118ML enema 1 enema  1 enema Rectal Once PRN Pamela S Love, PA-C      . traZODone (DESYREL) tablet 25-50 mg  25-50 mg Oral QHS PRN Bary Leriche, PA-C        REVIEW OF SYSTEMS:   Unable to obtain due to patient's aphasia  PHYSICAL EXAMINATION: ECOG PERFORMANCE STATUS: 4 - Bedbound  Filed Vitals:   09/02/15 0510 09/02/15 1547  BP: 128/81 114/71  Pulse: 91 80  Temp: 97.4 F (36.3 C) 98.2 F (36.8 C)  Resp: 18 18   Filed Weights   09/01/15 1116 09/02/15 0510  Weight: 209 lb 3.2 oz (94.892 kg) 202 lb 1.6 oz (91.672 kg)    GENERAL:alert, no distress and comfortable SKIN: skin color, texture, turgor are normal, no rashes or significant lesions EYES: normal, conjunctiva are pink and non-injected, sclera clear OROPHARYNX:no exudate, no erythema and lips, buccal mucosa, and tongue normal  NECK: supple, thyroid normal size, non-tender, without nodularity LYMPH:  no palpable lymphadenopathy in the cervical, axillary or inguinal LUNGS: clear to auscultation and percussion with normal breathing effort HEART: regular rate & rhythm and no murmurs and no lower extremity edema ABDOMEN:abdomen soft, non-tender and normal bowel sounds Musculoskeletal:no cyanosis of digits and no clubbing    LABORATORY DATA:  I have reviewed the data as listed Lab Results   Component Value Date   WBC 6.7 08/29/2015   HGB 13.0 08/29/2015   HCT 38.7* 08/29/2015   MCV 92.6 08/29/2015   PLT 232 08/29/2015  Recent Labs  08/22/15 2024  08/27/15 1207  08/29/15 0454  08/31/15 0440 09/01/15 0700 09/02/15 0556  NA 140  < > 134*  < > 133*  < > 135 135 135  K 4.7  < > 4.6  < > 4.2  < > 4.4 4.4 4.5  CL 106  < > 96*  < > 100*  < > 103 104 104  CO2 23  < > 25  < > 21*  < > 22 21* 23  GLUCOSE 98  < > 113*  < > 105*  < > 96 100* 103*  BUN 32*  < > 37*  < > 62*  < > 46* 42* 40*  CREATININE 1.59*  < > 1.61*  < > 1.73*  < > 1.26* 1.16 1.30*  CALCIUM 9.7  < > 9.4  < > 8.6*  < > 9.0 9.1 9.0  GFRNONAA 41*  < > 40*  < > 37*  < > 54* 59* 52*  GFRAA 47*  < > 46*  < > 42*  < > >60 >60 60*  PROT 7.1  --  7.7  --  6.3*  --   --   --   --   ALBUMIN 3.8  --  3.2*  --  2.4*  --   --   --   --   AST 29  --  28  --  56*  --   --   --   --   ALT 23  --  21  --  55  --   --   --   --   ALKPHOS 54  --  63  --  46  --   --   --   --   BILITOT 1.0  --  0.9  --  0.5  --   --   --   --   < > = values in this interval not displayed.  RADIOGRAPHIC STUDIES: I have personally reviewed the radiological images as listed and agreed with the findings in the report. Ct Angio Head W/cm &/or Wo Cm  08/23/2015  CLINICAL DATA:  Intracranial hemorrhage. EXAM: CT ANGIOGRAPHY HEAD TECHNIQUE: Multidetector CT imaging of the head was performed using the standard protocol during bolus administration of intravenous contrast. Multiplanar CT image reconstructions and MIPs were obtained to evaluate the vascular anatomy. CONTRAST:  100 mL Omnipaque 300 IV COMPARISON:  CT head 08/22/2015 FINDINGS: CT HEAD Brain: Left frontal lobe hemorrhage is unchanged measuring 5.9 x 4.7 cm. There is surrounding edema which shows mild progression. No new area of hemorrhage. There is mass-effect on the left frontal lobe with mild midline shift to the right of 5 mm which appears unchanged. No ventricular hemorrhage. Negative  for acute ischemic infarct. Calvarium and skull base: Negative Paranasal sinuses: Mild mucosal edema in the sphenoid sinus. Mastoid sinus is clear. Orbits: No orbital mass. Bilateral lens replacement. Scleral buckle on the left. CTA HEAD Anterior circulation: Cavernous carotid widely patent bilaterally. No cavernous stenosis or aneurysm. Anterior and middle cerebral arteries are patent bilaterally. No stenosis or occluded segments. No aneurysm or vascular malformation to account for the left frontal hemorrhage. Posterior circulation: Both vertebral arteries patent to the basilar. Left vertebral dominant. PICA patent bilaterally. Basilar patent. Superior cerebellar and posterior cerebral arteries patent bilaterally without stenosis. Venous sinuses: Patent Anatomic variants: None Delayed phase:Normal enhancement on delayed imaging. No enhancing mass lesion identified. IMPRESSION: Large left frontal hematoma unchanged from yesterday. No underlying  vascular malformation. Consider amyloid and hypertension as causes of the hemorrhage. Electronically Signed   By: Franchot Gallo M.D.   On: 08/23/2015 17:38   Dg Ankle 2 Views Left  08/27/2015  CLINICAL DATA:  Left ankle pain and swelling EXAM: LEFT ANKLE - 2 VIEW COMPARISON:  None. FINDINGS: Two views of the left ankle submitted. Ankle mortise is preserved. There is avulsed bony fragment adjacent to distal tibia medial malleolus measures about 8.8 mm. This is of indeterminate age. Appears well corticated and could be from prior injury. Clinical correlation is necessary. IMPRESSION: Ankle mortise is preserved. There is avulsed bony fragment adjacent to distal tibia medial malleolus measures about 8.8 mm. This is of indeterminate age. Appears well corticated and could be from prior injury. Clinical correlation is necessary. Electronically Signed   By: Lahoma Crocker M.D.   On: 08/27/2015 13:43   Ct Head Wo Contrast  08/26/2015  CLINICAL DATA:  Follow-up for intracranial  hemorrhage. EXAM: CT HEAD WITHOUT CONTRAST TECHNIQUE: Contiguous axial images were obtained from the base of the skull through the vertex without intravenous contrast. COMPARISON:  08/23/2015 FINDINGS: Skull and Sinuses:Stable.  No interval or acute finding. Visualized orbits: Bilateral cataract resection and scleral band on the left. No acute finding Brain: Parenchymal hematoma in the left frontal lobe shows no interval enlargement. Maximal dimensions today are 56 x 51 by 31 mm. A rim of vasogenic edema is stable. The hematoma continues to dissect to the left lateral ventricular margin, without intraventricular or subarachnoid extension. Mass effect is similar to previous, midline shift 6-7 mm anteriorly. No signs of acute infarct. No new site of hemorrhage. IMPRESSION: Left frontal hematoma shows no interval enlargement. Similar local mass effect and vasogenic edema. Electronically Signed   By: Monte Fantasia M.D.   On: 08/26/2015 12:49   Ct Head Wo Contrast  08/22/2015  CLINICAL DATA:  75 year old male stents with stroke-like symptoms. EXAM: CT HEAD WITHOUT CONTRAST TECHNIQUE: Contiguous axial images were obtained from the base of the skull through the vertex without intravenous contrast. COMPARISON:  None. FINDINGS: There is a 5.7 x 4.5 cm left frontal intraparenchymal acute hemorrhage. There is edema in the surrounding brain parenchyma. There is associated mild mass effect and compression of the frontal horn of the left lateral ventricle. This is a 6 mm left-to-right midline shift is noted. No other acute hemorrhage identified. There is mild prominence of the ventricles and sulci compatible with age-related atrophy. Periventricular and deep white matter chronic microvascular ischemic changes noted. The visualized paranasal sinuses and mastoid air cells are clear. The calvarium is intact. IMPRESSION: Large left frontal lobe intraparenchymal acute hemorrhage with associated mass effect and approximately 6 mm  left-to-right midline shift. These results were called by telephone at the time of interpretation on 08/22/2015 at 10:03 pm to Dr. Elnora Morrison , who verbally acknowledged these results. Electronically Signed   By: Anner Crete M.D.   On: 08/22/2015 22:07   Ct Chest W Contrast  09/01/2015  CLINICAL DATA:  Left lower lobe mass on previous chest radiography. EXAM: CT CHEST WITH CONTRAST TECHNIQUE: Multidetector CT imaging of the chest was performed during intravenous contrast administration. CONTRAST:  64m ISOVUE-300 IOPAMIDOL (ISOVUE-300) INJECTION 61% COMPARISON:  Radiography 08/23/2015 FINDINGS: Within the left upper lobe in the lingular region, there is a well-circumscribed mass measuring 2.8 x 2.5 x 2.1 cm. This was not described on a previous study from 2000 for as therefore worrisome for well-circumscribed malignancy. This is not spiculated. Does not  appear to extend to the pleura. No second lesion identified in the left lung. In the right lung, in the upper lobe posterior laterally, there is a 6 mm well-circumscribed nodule. Otherwise, the lung parenchyma is clear. There is not a background pattern of emphysema. No pleural fluid. No pericardial fluid. The patient has had previous median sternotomy and CABG. Pacemaker is in place. There is atherosclerosis of the aorta but no aneurysm or dissection. No mediastinal or hilar mass or lymphadenopathy. Scans in the upper abdomen do not show any significant finding. No adrenal mass. Ordinary degenerative changes affect the spine. IMPRESSION: 2.8 x 2.5 x 2.1 cm well-circumscribed mass in the left upper lobe in the lingular region, not described on a CT of 2004. This is likely to represent a malignancy. 6 mm nodule posterior laterally in the left upper lobe, image 35. This is nonspecific and could be a benign nodule, a second primary or a metastasis. No lymphadenopathy. PET scan may be useful in evaluating these 2 areas. Electronically Signed   By: Nelson Chimes  M.D.   On: 09/01/2015 20:09   US Renal  08/29/2015  CLINICAL DATA:  75 year old with left frontal hematoma, also with acute superimposed upon chronic renal insufficiency. EXAM: RENAL / URINARY TRACT ULTRASOUND COMPLETE COMPARISON:  None. FINDINGS: Right Kidney: Length: Approximately 11.7 cm. Well-preserved cortex. Echogenic parenchyma. Approximate 2.3 x 1.9 x 1.6 cm anechoic mass with a thin internal septation demonstrating acoustic enhancement and no internal color Doppler flow involving the mid kidney. Approximate 1.1 x 1.0 x 1.3 cm anechoic mass with acoustic enhancement and no internal color Doppler flow involving the upper pole. No solid renal masses. No hydronephrosis. Left Kidney: Length: Approximately 12.1 cm. Well-preserved cortex. Echogenic parenchyma. Approximate 1.3 x 1.3 x 1.1 cm anechoic mass with acoustic enhancement and no internal color Doppler flow involving the upper pole. Approximate 1.2 x 0.9 x 0.6 cm anechoic mass with acoustic enhancement and no internal color Doppler flow involving the mid kidney. No solid renal masses. No hydronephrosis. Bladder: Decompressed by Foley catheter. IMPRESSION: 1. No evidence of hydronephrosis involving either kidney to suggest urinary tract obstruction as a cause of renal insufficiency. 2. Echogenic parenchyma bilaterally indicating medical renal disease. 3. Cortical cysts involving both kidneys. No significant focal parenchymal abnormality involving either kidney. Electronically Signed   By: Evangeline Dakin M.D.   On: 08/29/2015 15:50   Chest Port 1 View  08/23/2015  CLINICAL DATA:  Hypertension EXAM: PORTABLE CHEST 1 VIEW COMPARISON:  None. FINDINGS: There is a masslike area in the left lower lobe region measuring 3.8 x 3.0 cm. Lungs elsewhere clear. Heart is mildly enlarged with pulmonary vascularity within normal limits. No adenopathy. Pacemaker leads are attached to the right atrium, right ventricle, and left ventricle. There is atherosclerotic  calcification in the aorta. IMPRESSION: Apparent left lower lobe mass measuring 3.8 x 3.0 cm. Advise correlation with chest CT, ideally with intravenous contrast, to further evaluate. Lungs elsewhere clear. Heart mildly enlarged with pacemaker leads as described. Patient is also status post coronary artery bypass grafting. These results will be called to the ordering clinician or representative by the Radiologist Assistant, and communication documented in the PACS or zVision Dashboard. Electronically Signed   By: Lowella Grip III M.D.   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 He was incidentally found to have  a 2.8 cm mass in the left upper lobe of lung.  1. LUL lung mass -I personally reviewed his CT chest, the left upper lobe lung mass appears to be round, no other significant lung nodules, no hilar or mediastinal adenopathy. -His daughter will try to obtain a CT chest from 2-3 years ago, for comparison -I discussed with her daughter that malignancy certainly is a possibility, could be a primary lung cancer, versus a metastatic melanoma. -I recommend a PET scan as outpatient. If PET scan is negative for other metastasis, I would recommend a tissue biopsy. The lesion is peripheral, I think percutaneous needle biopsy by interventional radiology is probably more feasible. She has been validated by only service, he can certainly follow up with Dr. Lamonte Sakai also for bronchoscopy and biopsy. Given his recent hemorrhagic stroke, I'll feel bronchoscopy and anesthesia may more difficult for pt to tolerate.  -If patient is going to stay for rehabilitation more than 3-4 weeks, I recommend get IR consult for needle biopsy. -I will follow up with him after his hospital discharge. I will be happy to set up his outpt PET scan if  needed. -I discussed the above with his daughter Dr. Gala [name].  -please inform me when he is ready to be discharged.  All questions were answered. The patient knows to call the clinic with any problems, questions or concerns.      [name], MD 09/02/2015 7:48 PM

## 2015-09-02 NOTE — Progress Notes (Signed)
Speech Language Pathology Daily Session Note  Patient Details  Name: David [name] MRN: [id number] Date of Birth: [birth year]  Today's Date: 09/02/2015 SLP Individual Time: 1430-1530 SLP Individual Time Calculation (min): 60 min  Short Term Goals: Week 1: SLP Short Term Goal 1 (Week 1): Pt demonstrate >75% acc with Y/N?s with min A. SLP Short Term Goal 2 (Week 1): Pt demonstrate single word reading comprehension with min A. SLP Short Term Goal 3 (Week 1): Pt demonstrate object naming >75% with mod A. SLP Short Term Goal 4 (Week 1): Pt communicate basic needs/wants with mod A. SLP Short Term Goal 5 (Week 1): Pt demonstrate O x4 with mod A. SLP Short Term Goal 6 (Week 1): Pt tolerate regular diet at mod I.  Skilled Therapeutic Interventions: Pt seen for skilled SLP with focus on language.  Focus this date on increasing confidence and fluency, so the majority of tasks were completed initially with max A faded to min/mod. Pt O x4 with written choices. Object labeling 90% acc with phonemic and semantic cueing. Demonstrated single word reading comprehension 4/5 acc. Pt expressed feeling encouraged with today's session.   Function:  Eating Eating                 Cognition Comprehension Comprehension assist level: Understands basic 50 - 74% of the time/ requires cueing 25 - 49% of the time  Expression   Expression assist level: Expresses basic 25 - 49% of the time/requires cueing 50 - 75% of the time. Uses single words/gestures.  Social Interaction Social Interaction assist level: Interacts appropriately 75 - 89% of the time - Needs redirection for appropriate language or to initiate interaction.  Problem Solving Problem solving assist level: Solves basic 25 - 49% of the time - needs direction more than half the time to initiate, plan or complete simple activities  Memory Memory assist level: Recognizes or recalls 25 - 49% of the time/requires cueing 50 - 75% of the time    Pain Pain  Assessment Pain Assessment: Faces Faces Pain Scale: Hurts little more Pain Location: Arm Pain Orientation: Left Pain Descriptors / Indicators: Grimacing Pain Onset: With Activity Pain Intervention(s): Rest  Therapy/Group: Individual Therapy  Vinetta Bergamo MA, CCC-SLP 09/02/2015, 5:03 PM

## 2015-09-03 ENCOUNTER — Inpatient Hospital Stay (HOSPITAL_COMMUNITY): Payer: Medicare Other | Admitting: Occupational Therapy

## 2015-09-03 ENCOUNTER — Inpatient Hospital Stay (HOSPITAL_COMMUNITY): Payer: Medicare Other | Admitting: Physical Therapy

## 2015-09-03 ENCOUNTER — Inpatient Hospital Stay (HOSPITAL_COMMUNITY): Payer: Medicare Other | Admitting: Speech Pathology

## 2015-09-03 ENCOUNTER — Other Ambulatory Visit: Payer: Self-pay | Admitting: Emergency Medicine

## 2015-09-03 DIAGNOSIS — R918 Other nonspecific abnormal finding of lung field: Secondary | ICD-10-CM

## 2015-09-03 LAB — BASIC METABOLIC PANEL
Anion gap: 9 (ref 5–15)
BUN: 36 mg/dL — AB (ref 6–20)
CHLORIDE: 106 mmol/L (ref 101–111)
CO2: 22 mmol/L (ref 22–32)
Calcium: 8.8 mg/dL — ABNORMAL LOW (ref 8.9–10.3)
Creatinine, Ser: 1.26 mg/dL — ABNORMAL HIGH (ref 0.61–1.24)
GFR calc Af Amer: >60 mL/min (ref >60)
GFR calc non Af Amer: 54 mL/min — ABNORMAL LOW (ref >60)
GLUCOSE: 92 mg/dL (ref 65–99)
POTASSIUM: 4.4 mmol/L (ref 3.5–5.1)
Sodium: 137 mmol/L (ref 135–145)

## 2015-09-03 MED ORDER — BETHANECHOL CHLORIDE 25 MG PO TABS
50.0000 mg | ORAL_TABLET | Freq: Three times a day (TID) | ORAL | Status: DC
  Administered 2015-09-03 – 2015-09-07 (×14): 50 mg via ORAL
  Filled 2015-09-03 (×15): qty 2

## 2015-09-03 NOTE — Progress Notes (Signed)
At 0415, no void, bladder scan=387, I & O cath=400. No change in previous nurses assessment of edema like area  to penis. I & O cath with 54f coude cath, with minimal discomfort. No lidocaine gel used during cath.  NS infusing at 75cc/hr. MPatrici RanksA

## 2015-09-03 NOTE — Progress Notes (Signed)
Social Work Patient ID: David [name], male   DOB: January 04, 1941, 74 y.o.   MRN: [id number]  Patient/Family Conference  Patient/family in attendance:  Dr. Silas [place], pt's dtr  Staff in attendance:  Weldon Inches, ST; Benay Pillow, OT; Dwyane Dee, PT; Dr. Joie Bimler, attending MD; Alfonse Alpers, LCSW  Main focus:  To discuss pt's progress, prognosis, and discharge planning.  Synopsis of information shared:  Urban Gibson discussed pt's overall goals of supervision.  She is concerned about how pt will do stairs and she recommended to dtr that they have rails installed which she said can be done.  Katie talked about pt needing lots of verbal cues and that his cognition fluctuates with his frustration and fatigue levels.  Marcene Brawn shared that speech has been pt's biggest challenge and will continue to be as he heals.  She is using music to help him and having him sing.  She reported that pt is trying really hard.  It's not just his language that is difficult, but his initiation of language.  Dr. Posey Pronto discussed pt's brain condition and how the brain works and heals.  Pt's dtr asked about pt's urination and renal function.  Dr. Posey Pronto explained the reasons for the retention include that pt may have the urge to urinate, but can't express the need or that the stroke itself can cause urinary retention.  Pt is on urecholine and Dr. Posey Pronto just increased this.  Pt's dtr questioned why pt was voiding in ICU and Dr. Posey Pronto said that sometimes it takes a while to see all of the symptoms of stroke or that he may not have been completely emptying back then.  BP is good and lisinopril has been stopped.  Dtr shared that pt has been on Zoloft for years and wondered if there was something better for him to be on.  Dr. Posey Pronto wants to stick with Zoloft for now.    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  Both  of them will need supervision and dtr is trying to decide if she will hire caregivers for them at home.or if they need to move to assisted living.  Pt could also move to [facility] for a bit to give him more time to heal and rehab.  Dtr is eager to receive results from pt's CT scan of lung where a mass was found on x-ray.  Patient/family response:  Dtr was appreciative of time spent with team and stated that her questions had been answered. She is not sure of the next step, but was made aware of the targeted d/c date of 09-10-15.  Follow-up/action plans:  CSW to call [facility] to find what pt's options are for a higher level of care, if needed.  CSW to f/u with pt's dtr at that time.  CSW also gave her a private duty list, as she wonders if pt would do better in his own home.  PT explained that pt will likely need 24/7 supervision for a while and it does not seem that pt's wife is an appropriate person to provide this.  CSW and dtr to talk after [facility] calls CSW re: options and then plans for pt's d/c can start to be made.

## 2015-09-03 NOTE — Progress Notes (Signed)
Physical Therapy Session Note  Patient Details  Name: David [name] MRN: [id number] Date of Birth: Jul 19, 1940  Today's Date: 09/03/2015 PT Individual Time: 0812-0920 PT Individual Time Calculation (min): 68 min   Short Term Goals: Week 2:  PT Short Term Goal 1 (Week 2): =LTGs  Skilled Therapeutic Interventions/Progress Updates:   Pt received in bed; performed supine > sit with bed rail and supervision with extra time and verbal cues to initiate and sequence.  Pt noted to have had incontinent BM.  Required mod A to stand initially from bed but then able to ambulate with RW and ASO to toilet with min A and verbal cues for safety with RW.  Pt able to doff pants but able to maintain balance in standing with UE support while therapist performed hygiene and donning of clothing.  Transitioned to w/c with min A.  In gym discussed with pt home entry/exit.  Pt able to confirm 3 STE, no rail.  Assessed pt ability to perform one step negotiation with L HHA; pt required mod-max A to perform safely and consistently grabbed at rail on R.  Performed stair negotiation with quad cane in RUE with therapist demonstrating safe sequence first; pt return demonstrated up/down 4 stairs with quad cane in RUE, HHA on L with mod A overall and verbal cues for safe step to sequence.  Also performed gait training with quad cane x 100' over level and compliant surface with min A for even surfaces and mod A for compliant with verbal cues for gait sequence and full step length RLE.  Pt preferred quad cane over RW but will require continued training.  Returned to room in w/c and pt left with all items within reach.  Therapy Documentation Precautions:  Precautions Precautions: Fall Precaution Comments: L ankle edema and pain with AROM and weight bearing Required Braces or Orthoses: Other Brace/Splint Other Brace/Splint: ASO  Restrictions Weight Bearing Restrictions: No Vital Signs:  116/49; HR: 78 bpm, 98% Pain:  Reports  pain around penis; RN and PA aware and in room assessing at start of session  See Function Navigator for Current Functional Status.   Therapy/Group: Individual Therapy  Raylene Everts [facility] 09/03/2015, 12:27 PM

## 2015-09-03 NOTE — Progress Notes (Signed)
Social Work Patient ID: David [name], male   DOB: 07-01-1940, 75 y.o.   MRN: [id number]   CSW called pt's dtr to update her on team conference and to discuss pt's d/c plan.  Dtr wanted to have a family conference, so CSW arranged this for 09-02-15.  See team conference note for more details.

## 2015-09-03 NOTE — Progress Notes (Signed)
Occupational Therapy Session Note  Patient Details  Name: David [name] MRN: [id number] Date of Birth: 01/31/1941  Today's Date: 09/03/2015 OT Individual Time:  -   1300--1400   (60 min)      Short Term Goals: Week 1:  OT Short Term Goal 1 (Week 1): Pt will perform toilet transfer with min A in order to decrease level of assist needed for functional transfers.  OT Short Term Goal 1 - Progress (Week 1): Met OT Short Term Goal 2 (Week 1): Pt will engage in 15 minutes of functional activity with 2 or less rest breaks secondary to fatigue. OT Short Term Goal 2 - Progress (Week 1): Met OT Short Term Goal 3 (Week 1): Pt will perform LB dressing with mod A in order to decrease level of assist with functional tasks.  OT Short Term Goal 3 - Progress (Week 1): Met OT Short Term Goal 4 (Week 1): Pt will perform shower transfer with min A in order to decrease level of assist for functional transfers.  OT Short Term Goal 4 - Progress (Week 1): Met Week 2:  OT Short Term Goal 1 (Week 2): STGs=LTGs secondary to upcoming discharge Week 3:     Skilled Therapeutic Interventions/Progress Updates:    Pt sitting in wc.  Wife and sister present. Skilled OT intervention with treatment focus on the following: transfers, standing balance, sit to stand, safety awareness, dynamic sitting balance.     Pt agreed to shower.  Transferred wc to tub transfer bench with mod assist.  Pt performed sit to stand x 4 with min assist during shower/  Pt exhibied safe behaviors during shower.  He urinated while in shower.  Transferred to wc and dressed at sink with increased time due for rest breaks needed.    Pt  Transferred to bed with squat pivot and left with all needs in reach.    Therapy Documentation Precautions:  Precautions Precautions: Fall Precaution Comments: L ankle edema and pain with AROM and weight bearing Required Braces or Orthoses: Other Brace/Splint Other Brace/Splint: ASO  Restrictions Weight Bearing  Restrictions: No    Pain:  none        See Function Navigator for Current Functional Status.   Therapy/Group: Individual Therapy  Lisa Roca 09/03/2015, 1:51 PM

## 2015-09-03 NOTE — Progress Notes (Signed)
Social Work Patient ID: David [name], male   DOB: 1940-09-02, 75 y.o.   MRN: [id number]   Lynnda Child, LCSW Social Worker Signed  Patient Care Conference 09/03/2015 10:35 AM    Expand All Collapse All   Inpatient RehabilitationTeam Conference and Plan of Care Update Date: 09/01/2015   Time: 2:50 PM     Patient Name: David [name]       Medical Record Number: [id number]  Date of Birth: [birth year] Sex: Male         Room/Bed: 4W11C/4W11C-01 Payor Info: Payor: MEDICARE / Plan: MEDICARE PART A AND B / Product Type: *No Product type* /    Admitting Diagnosis: LT FRONTAL HEMMORRHAGE   Admit Date/Time:  08/26/2015  4:42 PM Admission Comments: No comment available   Primary Diagnosis:  Hemiplegia and hemiparesis following nontraumatic intracerebral hemorrhage affecting right dominant side (HCC) Principal Problem: Hemiplegia and hemiparesis following nontraumatic intracerebral hemorrhage affecting right dominant side [facility])    Patient Active Problem List     Diagnosis  Date Noted   .  Mass of lung     .  Mass of lower lobe of left lung     .  Pain and swelling of ankle     .  CRF (chronic renal failure)     .  Urinary retention     .  Hypertensive emergency  08/26/2015   .  Hyperlipidemia  08/26/2015   .  Obesity  08/26/2015   .  Lung mass  08/26/2015   .  Incontinence  08/26/2015   .  Hemorrhagic stroke ([place])  08/26/2015   .  Hemiplegia and hemiparesis following nontraumatic intracerebral hemorrhage affecting right dominant side ([place])  08/26/2015   .  Aphasia following nontraumatic intracerebral hemorrhage  08/26/2015   .  Gait disturbance, post-stroke  08/26/2015   .  Benign essential HTN     .  OSA (obstructive sleep apnea)     .  CKD (chronic kidney disease)     .  Pacemaker     .  ICH (intracerebral hemorrhage) ([place]) - L frontal due to HTN vs CAA  08/22/2015     Expected Discharge Date: Expected Discharge Date: 09/10/15  Team Members Present: Physician leading  conference: Dr. Delice Lesch Social Worker Present: Lennart Pall, LCSW Nurse Present: Dorien Chihuahua, RN PT Present: Dwyane Dee, PT OT Present: Benay Pillow, OT SLP Present: Weston Anna, SLP PPS Coordinator present : Daiva Nakayama, RN, CRRN        Current Status/Progress  Goal  Weekly Team Focus   Medical     Right hemiparesis, aphasia, apraxia and cognitive deficits secondary to Left frontal intracranial hypertensive hemorrhage   SLP, improve internal frustration, follow up on imaging and AKI   See above   Bowel/Bladder     I&O coude cath for large volumes. Will have >350 q4hours   I&O cath from caregiver with min assist and manage bowel min assist  contiune education of I&O cathing and continue to attempt timed tolieting with bowel    Swallow/Nutrition/ Hydration     Regular textures with thin liquids, Mod I-Supervision for use of swallow strategies  Mod I  Increased use of small bites and a slow rate   ADL's     supervision - min A for self care tasks, min A for functional transfers/mobility, mod- max multimodal cues  supervision overall  self care retraining, transfers, balance, activity tolerance    Mobility     min assist for  transfers and short distance gait, mod assist on stairs   supervision overall  gait training, transfers, stair negotiation, balance, activity tolerance   Communication     Max-Total A  Min A  yes/no accuracy, naming, comprehension at the word level, basic wants/needs    Safety/Cognition/ Behavioral Observations    Supervision   Supervision   orientation, awareness    Pain     pain to left ankle. PRN tylenol   3 or less  assess pain and medicate as needed    Skin     skin CDI          Rehab Goals Patient on target to meet rehab goals: Yes Rehab Goals Revised: none *See Care Plan and progress notes for long and short-term goals.    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  Family is trying to decide what is best for both parents going forward. Independent living apt with paid caregivers vs. temporary ALF vs. short term SNF.  Pt's family can come for family education as needed.    Team Discussion:    Family conference to be held in the morning of 09-02-15.  Family has already purchased tub seat.  MD stated that kidney function is improving, but pt still with urinary retention and being switched to coude cath and medications started.  MD to work up left lower lobe mass when renal function is better.  Pt is currently stead assist/supervision for showers and dressing with max cues.  Language is his biggest barrier.  Supervision with PT with max multi-modal cues.  Some decreased safety awareness.  Pt's frustration may be making his language worse.  Pt with supervision level goals.   Revisions to Treatment Plan:    none    Continued Need for Acute Rehabilitation Level of Care: The patient requires daily medical management by a physician with specialized training in physical medicine and rehabilitation for the following conditions: Daily direction of a multidisciplinary physical rehabilitation program to ensure safe treatment while eliciting the highest outcome that is of practical value to the patient.: Yes Daily medical management of patient stability for increased activity during participation in an intensive rehabilitation regime.: Yes Daily analysis of laboratory values and/or radiology reports with any subsequent need for medication adjustment of medical intervention for : Neurological problems;Urological problems;Renal problems;Pulmonary problems  Sheridyn Canino, [name] 09/03/2015, 10:38 AM

## 2015-09-03 NOTE — Patient Care Conference (Signed)
Inpatient RehabilitationTeam Conference and Plan of Care Update Date: 09/01/2015   Time: 2:50 PM    Patient Name: David [name]      Medical Record Number: [id number]  Date of Birth: 1940/04/14 Sex: Male         Room/Bed: 4W11C/4W11C-01 Payor Info: Payor: MEDICARE / Plan: MEDICARE PART A AND B / Product Type: *No Product type* /    Admitting Diagnosis: LT FRONTAL HEMMORRHAGE  Admit Date/Time:  08/26/2015  4:42 PM Admission Comments: No comment available   Primary Diagnosis:  Hemiplegia and hemiparesis following nontraumatic intracerebral hemorrhage affecting right dominant side (HCC) Principal Problem: Hemiplegia and hemiparesis following nontraumatic intracerebral hemorrhage affecting right dominant side [facility])  Patient Active Problem List   Diagnosis Date Noted  . Mass of lung   . Mass of lower lobe of left lung   . Pain and swelling of ankle   . CRF (chronic renal failure)   . Urinary retention   . Hypertensive emergency 08/26/2015  . Hyperlipidemia 08/26/2015  . Obesity 08/26/2015  . Lung mass 08/26/2015  . Incontinence 08/26/2015  . Hemorrhagic stroke ([place]) 08/26/2015  . Hemiplegia and hemiparesis following nontraumatic intracerebral hemorrhage affecting right dominant side ([place]) 08/26/2015  . Aphasia following nontraumatic intracerebral hemorrhage 08/26/2015  . Gait disturbance, post-stroke 08/26/2015  . Benign essential HTN   . OSA (obstructive sleep apnea)   . CKD (chronic kidney disease)   . Pacemaker   . ICH (intracerebral hemorrhage) ([place]) - L frontal due to HTN vs CAA 08/22/2015    Expected Discharge Date: Expected Discharge Date: 09/10/15  Team Members Present: Physician leading conference: Dr. Delice Lesch Social Worker Present: Lennart Pall, LCSW Nurse Present: Dorien Chihuahua, RN PT Present: Dwyane Dee, PT OT Present: Benay Pillow, OT SLP Present: Weston Anna, SLP PPS Coordinator present : Daiva Nakayama, RN, CRRN     Current Status/Progress Goal  Weekly Team Focus  Medical   Right hemiparesis, aphasia, apraxia and cognitive deficits secondary to Left frontal intracranial hypertensive hemorrhage  SLP, improve internal frustration, follow up on imaging and AKI  See above   Bowel/Bladder   I&O coude cath for large volumes. Will have >350 q4hours  I&O cath from caregiver with min assist and manage bowel min assist  contiune education of I&O cathing and continue to attempt timed tolieting with bowel    Swallow/Nutrition/ Hydration   Regular textures with thin liquids, Mod I-Supervision for use of swallow strategies  Mod I  Increased use of small bites and a slow rate   ADL's   supervision - min A for self care tasks, min A for functional transfers/mobility, mod- max multimodal cues  supervision overall  self care retraining, transfers, balance, activity tolerance   Mobility   min assist for transfers and short distance gait, mod assist on stairs   supervision overall  gait training, transfers, stair negotiation, balance, activity tolerance   Communication   Max-Total A  Min A  yes/no accuracy, naming, comprehension at the word level, basic wants/needs    Safety/Cognition/ Behavioral Observations  Supervision   Supervision   orientation, awareness    Pain   pain to left ankle. PRN tylenol   3 or less  assess pain and medicate as needed   Skin   skin CDI          Rehab Goals Patient on target to meet rehab goals: Yes Rehab Goals Revised: none *See Care Plan and progress notes for long and short-term goals.  Barriers to Discharge: LLL  mass, AKI, safety, mobility, transfers    Possible Resolutions to Barriers:  CT ordered, follow labs, pt and family edu    Discharge Planning/Teaching Needs:  Pt is the caregiver for his wife with dementia.  Family is trying to decide what is best for both parents going forward. Independent living apt with paid caregivers vs. temporary ALF vs. short term SNF.  Pt's family can come for family  education as needed.   Team Discussion:  Family conference to be held in the morning of 09-02-15.  Family has already purchased tub seat.  MD stated that kidney function is improving, but pt still with urinary retention and being switched to coude cath and medications started.  MD to work up left lower lobe mass when renal function is better.  Pt is currently stead assist/supervision for showers and dressing with max cues.  Language is his biggest barrier.  Supervision with PT with max multi-modal cues.  Some decreased safety awareness.  Pt's frustration may be making his language worse.  Pt with supervision level goals.  Revisions to Treatment Plan:  none   Continued Need for Acute Rehabilitation Level of Care: The patient requires daily medical management by a physician with specialized training in physical medicine and rehabilitation for the following conditions: Daily direction of a multidisciplinary physical rehabilitation program to ensure safe treatment while eliciting the highest outcome that is of practical value to the patient.: Yes Daily medical management of patient stability for increased activity during participation in an intensive rehabilitation regime.: Yes Daily analysis of laboratory values and/or radiology reports with any subsequent need for medication adjustment of medical intervention for : Neurological problems;Urological problems;Renal problems;Pulmonary problems  Chetan Mehring, [name] 09/03/2015, 10:38 AM

## 2015-09-03 NOTE — Progress Notes (Signed)
Subjective/Complaints: Pt seen sitting up in bed this morning.  He appears to be able to answer questions more freely.  Review systems limited by aphasia and cognition  Objective: Vital Signs: Blood pressure 131/82, pulse 76, temperature 98.2 F (36.8 C), temperature source Oral, resp. rate 16, height 5' 9"  (1.753 m), weight 91.536 kg (201 lb 12.8 oz), SpO2 97 %. Ct Chest W Contrast  09/01/2015  CLINICAL DATA:  Left lower lobe mass on previous chest radiography. EXAM: CT CHEST WITH CONTRAST TECHNIQUE: Multidetector CT imaging of the chest was performed during intravenous contrast administration. CONTRAST:  39m ISOVUE-300 IOPAMIDOL (ISOVUE-300) INJECTION 61% COMPARISON:  Radiography 08/23/2015 FINDINGS: Within the left upper lobe in the lingular region, there is a well-circumscribed mass measuring 2.8 x 2.5 x 2.1 cm. This was not described on a previous study from 2000 for as therefore worrisome for well-circumscribed malignancy. This is not spiculated. Does not appear to extend to the pleura. No second lesion identified in the left lung. In the right lung, in the upper lobe posterior laterally, there is a 6 mm well-circumscribed nodule. Otherwise, the lung parenchyma is clear. There is not a background pattern of emphysema. No pleural fluid. No pericardial fluid. The patient has had previous median sternotomy and CABG. Pacemaker is in place. There is atherosclerosis of the aorta but no aneurysm or dissection. No mediastinal or hilar mass or lymphadenopathy. Scans in the upper abdomen do not show any significant finding. No adrenal mass. Ordinary degenerative changes affect the spine. IMPRESSION: 2.8 x 2.5 x 2.1 cm well-circumscribed mass in the left upper lobe in the lingular region, not described on a CT of 2004. This is likely to represent a malignancy. 6 mm nodule posterior laterally in the left upper lobe, image 35. This is nonspecific and could be a benign nodule, a second primary or a metastasis. No  lymphadenopathy. PET scan may be useful in evaluating these 2 areas. Electronically Signed   By: MNelson ChimesM.D.   On: 09/01/2015 20:09   Results for orders placed or performed during the hospital encounter of 08/26/15 (from the past 72 hour(s))  Basic metabolic panel     Status: Abnormal   Collection Time: 09/01/15  7:00 AM  Result Value Ref Range   Sodium 135 135 - 145 mmol/L   Potassium 4.4 3.5 - 5.1 mmol/L   Chloride 104 101 - 111 mmol/L   CO2 21 (L) 22 - 32 mmol/L   Glucose, Bld 100 (H) 65 - 99 mg/dL   BUN 42 (H) 6 - 20 mg/dL   Creatinine, Ser 1.16 0.61 - 1.24 mg/dL   Calcium 9.1 8.9 - 10.3 mg/dL   GFR calc non Af Amer 59 (L) >60 mL/min   GFR calc Af Amer >60 >60 mL/min    Comment: (NOTE) The eGFR has been calculated using the CKD EPI equation. This calculation has not been validated in all clinical situations. eGFR's persistently <60 mL/min signify possible Chronic Kidney Disease.    Anion gap 10 5 - 15  Basic metabolic panel     Status: Abnormal   Collection Time: 09/02/15  5:56 AM  Result Value Ref Range   Sodium 135 135 - 145 mmol/L   Potassium 4.5 3.5 - 5.1 mmol/L   Chloride 104 101 - 111 mmol/L   CO2 23 22 - 32 mmol/L   Glucose, Bld 103 (H) 65 - 99 mg/dL   BUN 40 (H) 6 - 20 mg/dL   Creatinine, Ser 1.30 (H) 0.61 -  1.24 mg/dL   Calcium 9.0 8.9 - 10.3 mg/dL   GFR calc non Af Amer 52 (L) >60 mL/min   GFR calc Af Amer 60 (L) >60 mL/min    Comment: (NOTE) The eGFR has been calculated using the CKD EPI equation. This calculation has not been validated in all clinical situations. eGFR's persistently <60 mL/min signify possible Chronic Kidney Disease.    Anion gap 8 5 - 15  Basic metabolic panel     Status: Abnormal   Collection Time: 09/03/15  4:12 AM  Result Value Ref Range   Sodium 137 135 - 145 mmol/L   Potassium 4.4 3.5 - 5.1 mmol/L   Chloride 106 101 - 111 mmol/L   CO2 22 22 - 32 mmol/L   Glucose, Bld 92 65 - 99 mg/dL   BUN 36 (H) 6 - 20 mg/dL    Creatinine, Ser 1.26 (H) 0.61 - 1.24 mg/dL   Calcium 8.8 (L) 8.9 - 10.3 mg/dL   GFR calc non Af Amer 54 (L) >60 mL/min   GFR calc Af Amer >60 >60 mL/min    Comment: (NOTE) The eGFR has been calculated using the CKD EPI equation. This calculation has not been validated in all clinical situations. eGFR's persistently <60 mL/min signify possible Chronic Kidney Disease.    Anion gap 9 5 - 15    Gen. no acute distress. Vital signs reviewed. Well-developed, well-nourished. HENT: Normocephalic atraumatic Eyes: Conjunctivae and EOM normal Cardio: RRR and No murmur Resp: CTA B/L and unlabored GI: BS positive and nondistended nontender Musc/Skel: No edema, no tenderness noted. Neuro:  Global aphasia, Expressive > receptive Alert and oriented x3 with increased time and options Able to perform demonstrated commands for the most part Motor: 4+/5 in the right deltoid, biceps, triceps, grip, hip flexor, knee extensor, ankle dorsiflex and plantar flexor.  5/5 on the left side in the same muscle groups., No ataxia. Skin:   Intact. Warm and dry.  Assessment/Plan: 1. Functional deficits secondary to Left frontal intracranial hypertensive hemorrhage  which require 3+ hours per day of interdisciplinary therapy in a comprehensive inpatient rehab setting. Physiatrist is providing close team supervision and 24 hour management of active medical problems listed below. Physiatrist and rehab team continue to assess barriers to discharge/monitor patient progress toward functional and medical goals. FIM: Function - Bathing Bathing activity did not occur: Refused Position: Shower Body parts bathed by patient: Right arm, Left arm, Chest, Abdomen, Front perineal area, Right upper leg, Left upper leg, Buttocks Body parts bathed by helper: Right lower leg, Left lower leg Bathing not applicable: Back Assist Level: Touching or steadying assistance(Pt > 75%)  Function- Upper Body Dressing/Undressing What is the  patient wearing?: Pull over shirt/dress Pull over shirt/dress - Perfomed by patient: Thread/unthread right sleeve, Thread/unthread left sleeve, Put head through opening, Pull shirt over trunk Assist Level: Set up Set up : To obtain clothing/put away Function - Lower Body Dressing/Undressing What is the patient wearing?: Pants, Underwear Position: Sitting EOB Underwear - Performed by patient: Thread/unthread right underwear leg, Thread/unthread left underwear leg, Pull underwear up/down Pants- Performed by patient: Thread/unthread left pants leg, Thread/unthread right pants leg, Pull pants up/down, Fasten/unfasten pants Pants- Performed by helper: Pull pants up/down, Thread/unthread right pants leg Non-skid slipper socks- Performed by patient: Don/doff right sock, Don/doff left sock Non-skid slipper socks- Performed by helper: Don/doff right sock, Don/doff left sock Assist for footwear: Supervision/touching assist Assist for lower body dressing: Touching or steadying assistance (Pt > 75%)  Function -  Toileting Toileting activity did not occur: No continent bowel/bladder event Toileting steps completed by patient: Adjust clothing prior to toileting, Performs perineal hygiene, Adjust clothing after toileting Toileting steps completed by helper: Adjust clothing prior to toileting, Performs perineal hygiene, Adjust clothing after toileting Assist level: Touching or steadying assistance (Pt.75%)  Function - Toilet Transfers Toilet transfer activity did not occur: Refused Toilet transfer assistive device: Grab bar, Walker, Elevated toilet seat/BSC over toilet Assist level to toilet: Touching or steadying assistance (Pt > 75%) Assist level from toilet: Touching or steadying assistance (Pt > 75%) Assist level to bedside commode (at bedside): 2 helpers (per Phebe Colla, NT) Assist level from bedside commode (at bedside): 2 helpers  Function - Chair/bed transfer Chair/bed transfer method: Stand  pivot, Ambulatory Chair/bed transfer assist level: Supervision or verbal cues Chair/bed transfer assistive device: Armrests, Walker Chair/bed transfer details: Verbal cues for sequencing, Verbal cues for technique, Tactile cues for posture, Tactile cues for weight bearing  Function - Locomotion: Wheelchair Will patient use wheelchair at discharge?: No Type: Manual Max wheelchair distance: 100 Assist Level: Supervision or verbal cues Assist Level: Supervision or verbal cues Wheel 150 feet activity did not occur: Safety/medical concerns Assist Level: Supervision or verbal cues Turns around,maneuvers to table,bed, and toilet,negotiates 3% grade,maneuvers on rugs and over doorsills: No Function - Locomotion: Ambulation Assistive device: Walker-rolling Max distance: 220 Assist level: Supervision or verbal cues Assist level: Supervision or verbal cues Assist level: Supervision or verbal cues Walk 150 feet activity did not occur: Safety/medical concerns Assist level: Supervision or verbal cues Walk 10 feet on uneven surfaces activity did not occur: Safety/medical concerns  Function - Comprehension Comprehension: Auditory Comprehension assist level: Understands basic 50 - 74% of the time/ requires cueing 25 - 49% of the time  Function - Expression Expression: Verbal Expression assist level: Expresses basic 25 - 49% of the time/requires cueing 50 - 75% of the time. Uses single words/gestures.  Function - Social Interaction Social Interaction assist level: Interacts appropriately 75 - 89% of the time - Needs redirection for appropriate language or to initiate interaction.  Function - Problem Solving Problem solving assist level: Solves basic 50 - 74% of the time/requires cueing 25 - 49% of the time  Function - Memory Memory assist level: Recognizes or recalls 25 - 49% of the time/requires cueing 50 - 75% of the time Patient normally able to recall (first 3 days only): That he or she is  in a hospital, Location of own room  Medical Problem List and Plan: 1.  Right hemiparesis, aphasia, apraxia and cognitive deficits secondary to Left frontal intracranial hypertensive hemorrhage  Continue CIR 2.  DVT Prophylaxis/Anticoagulation: Mechanical: Sequential compression devices, below knee Bilateral lower extremities 3. Pain Management: tylenol prn,   left ankle pain suspect sprain, x-ray reviewed, showing avulsed bony fragment distal tibia medial malleolus of indeterminate age 29. Mood:  Seems frustrated due to expressive/receptive deficits. Team to provide ego support. LCSW to follow for evaluation and support.   5. Neuropsych: This patient is not capable of making decisions on his own behalf. 6. Skin/Wound Care: routine pressure relief measures 7. Fluids/Electrolytes/Nutrition:  Monitor I/O.   BMP stable on 5/26 8.  CAD s/p CABG: On coreg and crestor 9. Labile BP: Monitor BP. Lisinoprildiscontinued and Coreg dose down titrated.  Improved with IVF, DC'd 10. CKD: BUN/Creatinine 32/1.5 at admission and at baseline.     Creatinine 1.26 on 5/26  Will continue to monitor  Renal ultrasound reviewed suggesting medical renal disease  disease  Continue to Encourage fluid intake.   11. Depression: Continue zoloft daily, increased on 5/22.   12. Hypothyroid: Resumed supplement.   13. LLL mass: CT chest reviewed from 5/24, suggesting potential malignancy.    Patient being followed by pulmonary and he monitored, who have communicated with the patient's daughter. Workup to be performed as outpatient. 14. Incontinence: Likely due to cognitive and processing issues--UA essentially neg except for some hematuria 15. Urinary retention  Urine culture negative on 5/21  Renal U/S reviewed suggesting medical renal disease, no obstruction.  Bethanechol 5 mg started 5/23, increased to 10 on 5/24, increased to 25 on 5/25, increased to 50 mg on 5/26. Cont to monitor 16. BPH: Cont Uroxatral 17. OSA :  Continue CPAP  LOS (Days) 8 A FACE TO FACE EVALUATION WAS PERFORMED  Ankit Lorie Phenix 09/03/2015, 9:18 AM   Diarrhea

## 2015-09-03 NOTE — Progress Notes (Signed)
Speech Language Pathology Weekly Progress and Session Notes  Patient Details  Name: David [name] MRN: [id number] Date of Birth: Apr 02, 1941  Beginning of progress report period: Aug 27, 2015 End of progress report period: Sep 03, 2015  Today's Date: 09/03/2015 SLP Individual Time: 0930-1030 SLP Individual Time Calculation (min): 60 min  Short Term Goals: Week 1: SLP Short Term Goal 1 (Week 1): Pt demonstrate >75% acc with Y/N?s with min A. SLP Short Term Goal 1 - Progress (Week 1): Not met SLP Short Term Goal 2 (Week 1): Pt demonstrate single word reading comprehension with min A. SLP Short Term Goal 2 - Progress (Week 1): Not met SLP Short Term Goal 3 (Week 1): Pt demonstrate object naming >75% with mod A. SLP Short Term Goal 3 - Progress (Week 1): Not met SLP Short Term Goal 4 (Week 1): Pt communicate basic needs/wants with mod A. SLP Short Term Goal 4 - Progress (Week 1): Not met SLP Short Term Goal 5 (Week 1): Pt demonstrate O x4 with mod A. SLP Short Term Goal 5 - Progress (Week 1): Met SLP Short Term Goal 6 (Week 1): Pt tolerate regular diet at mod I. SLP Short Term Goal 6 - Progress (Week 1): Met    New Short Term Goals: Week 2: SLP Short Term Goal 1 (Week 2): Pt will demonstrate >75% acc with Y/N?s with min A multimodal cues. SLP Short Term Goal 2 (Week 2): Pt will demonstrate O x4 with min A verbal and visual cues. SLP Short Term Goal 3 (Week 2): Pt will communicate basic needs/wants with mod A multimodal cues.  SLP Short Term Goal 4 (Week 2): Pt will demonstrate object naming >75% with mod A multimodal cues.  SLP Short Term Goal 5 (Week 2): Pt will demonstrate single word reading comprehension with min A multimodal cues.   Weekly Progress Updates: Patient has made functional but inconsistent gains and has met 2 of 6 STG's this reporting period due to improved swallowing function and orientation. Currently, patient is consuming regular textures with thin liquids without  overt s/s of aspiration and is Mod I for use of swallowing strategies. Patient is independently oriented X 4 when given written choices from a field of 3 and can complete automatic sequencies with extra time and overall Min A phonemic cues. Patient requires overall Max A multimodal cues to express his basic wants/needs at the word and phrase level, name functional objects and perform reading comprehension at the word level. Patient and family education is ongoing. Patient would benefit from continued skilled SLP intervention to maximize his functional communication and overall functional independence prior to discharge.     Intensity: Minumum of 1-2 x/day, 30 to 90 minutes Frequency: 3 to 5 out of 7 days Duration/Length of Stay: 6/2 Treatment/Interventions: Cognitive remediation/compensation;Multimodal communication approach;Speech/Language facilitation;Functional tasks;Patient/family education;Dysphagia/aspiration precaution training;Cueing hierarchy;Therapeutic Activities;Internal/external aids;Environmental controls   Daily Session  Skilled Therapeutic Interventions: Skilled treatment session focused on functional communication. SLP facilitated session by providing extra time and Min A phonemic cues for patient to perform automatic sequences with 100% accuracy.  Focus of this session was increasing confidence and fluency, so the majority of tasks were completed initially with Max A faded to Mod A multimodal cues. Patient was oriented x4 with written choices from a field of 3 and completed object naming with 60% accuaracy with Mod-Max phonemic and semantic cueing. Patient demonstrated single word reading comprehension 3/5 accuracy with Max A multimodal cues. Patient left upright in wheelchair with all needs  within reach and quick release belt in place. Continue with current plan of care.       Function:   Cognition Comprehension Comprehension assist level: Understands basic 50 - 74% of the time/  requires cueing 25 - 49% of the time  Expression   Expression assist level: Expresses basic 25 - 49% of the time/requires cueing 50 - 75% of the time. Uses single words/gestures.  Social Interaction Social Interaction assist level: Interacts appropriately 75 - 89% of the time - Needs redirection for appropriate language or to initiate interaction.  Problem Solving Problem solving assist level: Solves basic 50 - 74% of the time/requires cueing 25 - 49% of the time  Memory Memory assist level: Recognizes or recalls 25 - 49% of the time/requires cueing 50 - 75% of the time   Pain No/Denies Pain   Therapy/Group: Individual Therapy  Lennard Capek, [place] 09/03/2015, 4:25 PM

## 2015-09-04 ENCOUNTER — Inpatient Hospital Stay (HOSPITAL_COMMUNITY): Payer: Medicare Other

## 2015-09-04 ENCOUNTER — Inpatient Hospital Stay (HOSPITAL_COMMUNITY): Payer: Medicare Other | Admitting: Physical Therapy

## 2015-09-04 ENCOUNTER — Inpatient Hospital Stay (HOSPITAL_COMMUNITY): Payer: Medicare Other | Admitting: *Deleted

## 2015-09-04 ENCOUNTER — Inpatient Hospital Stay (HOSPITAL_COMMUNITY): Payer: Medicare Other | Admitting: Speech Pathology

## 2015-09-04 LAB — BASIC METABOLIC PANEL
ANION GAP: 8 (ref 5–15)
BUN: 31 mg/dL — ABNORMAL HIGH (ref 6–20)
CALCIUM: 9 mg/dL (ref 8.9–10.3)
CHLORIDE: 104 mmol/L (ref 101–111)
CO2: 24 mmol/L (ref 22–32)
Creatinine, Ser: 1.08 mg/dL (ref 0.61–1.24)
GFR calc non Af Amer: >60 mL/min (ref >60)
GLUCOSE: 100 mg/dL — AB (ref 65–99)
POTASSIUM: 4.4 mmol/L (ref 3.5–5.1)
Sodium: 136 mmol/L (ref 135–145)

## 2015-09-04 LAB — CBC WITH DIFFERENTIAL/PLATELET
BASOS PCT: 0 %
Basophils Absolute: 0 10*3/uL (ref 0.0–0.1)
Eosinophils Absolute: 0.3 10*3/uL (ref 0.0–0.7)
Eosinophils Relative: 3 %
HEMATOCRIT: 37.5 % — AB (ref 39.0–52.0)
HEMOGLOBIN: 12.8 g/dL — AB (ref 13.0–17.0)
LYMPHS PCT: 7 %
Lymphs Abs: 0.6 10*3/uL — ABNORMAL LOW (ref 0.7–4.0)
MCH: 31.4 pg (ref 26.0–34.0)
MCHC: 34.1 g/dL (ref 30.0–36.0)
MCV: 92.1 fL (ref 78.0–100.0)
MONO ABS: 0.6 10*3/uL (ref 0.1–1.0)
MONOS PCT: 7 %
NEUTROS ABS: 7.1 10*3/uL (ref 1.7–7.7)
NEUTROS PCT: 83 %
Platelets: 344 10*3/uL (ref 150–400)
RBC: 4.07 MIL/uL — ABNORMAL LOW (ref 4.22–5.81)
RDW: 14.4 % (ref 11.5–15.5)
WBC: 8.6 10*3/uL (ref 4.0–10.5)

## 2015-09-04 NOTE — Progress Notes (Signed)
Physical Therapy Session Note  Patient Details  Name: David [name] MRN: [id number] Date of Birth: 01-24-1941  Today's Date: 09/04/2015 PT Individual Time: 1515-1630 PT Individual Time Calculation (min): 75 min   Short Term Goals: Week 1:  PT Short Term Goal 1 (Week 1): Pt will transfer with consistent min assist and LRAD PT Short Term Goal 1 - Progress (Week 1): Met PT Short Term Goal 2 (Week 1): Pt will amb 50' with min assist and LRAD PT Short Term Goal 2 - Progress (Week 1): Met PT Short Term Goal 3 (Week 1): Pt will propel w/c 150' with supervision PT Short Term Goal 3 - Progress (Week 1): Met PT Short Term Goal 4 (Week 1): Pt will negotiate 4 steps with 1 rail and min assist PT Short Term Goal 4 - Progress (Week 1): Met Week 2:  PT Short Term Goal 1 (Week 2): =LTGs  Skilled Therapeutic Interventions/Progress Updates:    Gait with LBQC 3x 80 ft with min A from PT and mod cues for improved step to gait pattern and proprt sequencing. Gait training with RW for 171f, 1260f2, and 7542fT. Patient required min progressing to supervision A from PT with RW, as well as min cues for improved step-through gait pattern. Patient reports feeling more secure with RW compared to Quad cane on this day.  Gait with RW over 4 canes for 25 ft with min-supervision A from PT and min-mod cueing for improved AD management to avoid obstacles and improve gait patterns. .   NMR Foot taps on 3 inch step without UE support with min-mod A from PT and mod cues to remain on task and proper sequencing Lateral stepping to target x 10 BLE with HHA from PT. Min-mod A from PT to maintain  Balance and mod cues for problem solving to locate and identify target Anterior Stepping to target x 10 BLE with HHA from PT. PT provided min A and provided min-mod cues for identification of targets.   Patient left sitting on toilet with RN present.     Therapy Documentation Precautions:  Precautions Precautions:  Fall Precaution Comments: L ankle edema and pain with AROM and weight bearing Required Braces or Orthoses: Other Brace/Splint Other Brace/Splint: ASO  Restrictions Weight Bearing Restrictions: No General:   Vital Signs: Therapy Vitals Temp: 98.2 F (36.8 C) Temp Source: Oral Pulse Rate: 75 Resp: 17 BP: 118/69 mmHg  See Function Navigator for Current Functional Status.   Therapy/Group: Individual Therapy  AusLorie Phenix27/2017, 4:58 PM

## 2015-09-04 NOTE — Progress Notes (Signed)
Speech Language Pathology Daily Session Note  Patient Details  Name: David [name] MRN: [id number] Date of Birth: [birth year]  Today's Date: 09/04/2015 SLP Individual Time: 1300-1330 SLP Individual Time Calculation (min): 30 min  Short Term Goals: Week 2: SLP Short Term Goal 1 (Week 2): Pt will demonstrate >75% acc with Y/N?s with min A multimodal cues. SLP Short Term Goal 2 (Week 2): Pt will demonstrate O x4 with min A verbal and visual cues. SLP Short Term Goal 3 (Week 2): Pt will communicate basic needs/wants with mod A multimodal cues.  SLP Short Term Goal 4 (Week 2): Pt will demonstrate object naming >75% with mod A multimodal cues.  SLP Short Term Goal 5 (Week 2): Pt will demonstrate single word reading comprehension with min A multimodal cues.   Skilled Therapeutic Interventions: Skilled treatment session focused on cognition goals. SLP facilitated session by providing written cues for orientation information. Pt required mod A verbal cues to select correct written response in field of two. Pt provided with written choices for yes/no with basic questions with object present. Uncertain if pt understood task as he repeated object names when [place] named object. Pt returned to room, left upright in wheelchair with safety belt in place and all needs within reach. Continue current plan of care.    Function:   Cognition Comprehension Comprehension assist level: Understands basic 50 - 74% of the time/ requires cueing 25 - 49% of the time  Expression   Expression assist level: Expresses basic 25 - 49% of the time/requires cueing 50 - 75% of the time. Uses single words/gestures.  Social Interaction Social Interaction assist level: Interacts appropriately 75 - 89% of the time - Needs redirection for appropriate language or to initiate interaction.  Problem Solving Problem solving assist level: Solves basic 50 - 74% of the time/requires cueing 25 - 49% of the time  Memory Memory assist level:  Recognizes or recalls 25 - 49% of the time/requires cueing 50 - 75% of the time    Pain    Therapy/Group: Individual Therapy  Legend Tumminello 09/04/2015, 1:48 PM

## 2015-09-04 NOTE — Progress Notes (Signed)
Subjective/Complaints: Pt seen sitting up in bed this morning.  He appears to be able to answer questions more freely.  Review systems limited by aphasia and cognition  Objective: Vital Signs: Blood pressure 133/72, pulse 75, temperature 97.8 F (36.6 C), temperature source Axillary, resp. rate 16, height _0  (1.753 m), weight 91.173 kg (201 lb), SpO2 94 %. No results found. Results for orders placed or performed during the hospital encounter of 08/26/15 (from the past 72 hour(s))  Basic metabolic panel     Status: Abnormal   Collection Time: 09/02/15  5:56 AM  Result Value Ref Range   Sodium 135 135 - 145 mmol/L   Potassium 4.5 3.5 - 5.1 mmol/L   Chloride 104 101 - 111 mmol/L   CO2 23 22 - 32 mmol/L   Glucose, Bld 103 (H) 65 - 99 mg/dL   BUN 40 (H) 6 - 20 mg/dL   Creatinine, Ser 1.30 (H) 0.61 - 1.24 mg/dL   Calcium 9.0 8.9 - 10.3 mg/dL   GFR calc non Af Amer 52 (L) >60 mL/min   GFR calc Af Amer 60 (L) >60 mL/min    Comment: (NOTE) The eGFR has been calculated using the CKD EPI equation. This calculation has not been validated in all clinical situations. eGFR's persistently <60 mL/min signify possible Chronic Kidney Disease.    Anion gap 8 5 - 15  Basic metabolic panel     Status: Abnormal   Collection Time: 09/03/15  4:12 AM  Result Value Ref Range   Sodium 137 135 - 145 mmol/L   Potassium 4.4 3.5 - 5.1 mmol/L   Chloride 106 101 - 111 mmol/L   CO2 22 22 - 32 mmol/L   Glucose, Bld 92 65 - 99 mg/dL   BUN 36 (H) 6 - 20 mg/dL   Creatinine, Ser 1.26 (H) 0.61 - 1.24 mg/dL   Calcium 8.8 (L) 8.9 - 10.3 mg/dL   GFR calc non Af Amer 54 (L) >60 mL/min   GFR calc Af Amer >60 >60 mL/min    Comment: (NOTE) The eGFR has been calculated using the CKD EPI equation. This calculation has not been validated in all clinical situations. eGFR's persistently <60 mL/min signify possible Chronic Kidney Disease.    Anion gap 9 5 - 15  Basic metabolic panel     Status: Abnormal   Collection Time: 09/04/15  7:11 AM  Result Value Ref Range   Sodium 136 135 - 145 mmol/L   Potassium 4.4 3.5 - 5.1 mmol/L   Chloride 104 101 - 111 mmol/L   CO2 24 22 - 32 mmol/L   Glucose, Bld 100 (H) 65 - 99 mg/dL   BUN 31 (H) 6 - 20 mg/dL   Creatinine, Ser 1.08 0.61 - 1.24 mg/dL   Calcium 9.0 8.9 - 10.3 mg/dL   GFR calc non Af Amer >60 >60 mL/min   GFR calc Af Amer >60 >60 mL/min    Comment: (NOTE) The eGFR has been calculated using the CKD EPI equation. This calculation has not been validated in all clinical situations. eGFR's persistently <60 mL/min signify possible Chronic Kidney Disease.    Anion gap 8 5 - 15  CBC with Differential/Platelet     Status: Abnormal   Collection Time: 09/04/15  7:11 AM  Result Value Ref Range   WBC 8.6 4.0 - 10.5 K/uL   RBC 4.07 (L) 4.22 - 5.81 MIL/uL   Hemoglobin 12.8 (L) 13.0 - 17.0 g/dL   HCT 37.5 (L) 39.0 -  52.0 %   MCV 92.1 78.0 - 100.0 fL   MCH 31.4 26.0 - 34.0 pg   MCHC 34.1 30.0 - 36.0 g/dL   RDW 14.4 11.5 - 15.5 %   Platelets 344 150 - 400 K/uL   Neutrophils Relative % 83 %   Neutro Abs 7.1 1.7 - 7.7 K/uL   Lymphocytes Relative 7 %   Lymphs Abs 0.6 (L) 0.7 - 4.0 K/uL   Monocytes Relative 7 %   Monocytes Absolute 0.6 0.1 - 1.0 K/uL   Eosinophils Relative 3 %   Eosinophils Absolute 0.3 0.0 - 0.7 K/uL   Basophils Relative 0 %   Basophils Absolute 0.0 0.0 - 0.1 K/uL    Gen. no acute distress. Vital signs reviewed. Well-developed, well-nourished. HENT: Normocephalic atraumatic Eyes: Conjunctivae and EOM normal Cardio: RRR and No murmur Resp: CTA B/L and unlabored GI: BS positive and nondistended nontender Musc/Skel: No edema, no tenderness noted. Neuro:  Global aphasia, Expressive > receptive Alert and oriented x3 with increased time and options Able to perform demonstrated commands for the most part Motor: 4+/5 in the right deltoid, biceps, triceps, grip, hip flexor, knee extensor, ankle dorsiflex and plantar flexor.  5/5  on the left side in the same muscle groups., No ataxia. Skin:   Intact. Warm and dry.  Assessment/Plan: 1. Functional deficits secondary to Left frontal intracranial hypertensive hemorrhage  which require 3+ hours per day of interdisciplinary therapy in a comprehensive inpatient rehab setting. Physiatrist is providing close team supervision and 24 hour management of active medical problems listed below. Physiatrist and rehab team continue to assess barriers to discharge/monitor patient progress toward functional and medical goals. FIM: Function - Bathing Bathing activity did not occur: Refused Position: Shower Body parts bathed by patient: Right arm, Left arm, Chest, Abdomen, Front perineal area, Right upper leg, Left upper leg, Buttocks, Right lower leg, Left lower leg Body parts bathed by helper: Back Bathing not applicable: Back Assist Level: Touching or steadying assistance(Pt > 75%)  Function- Upper Body Dressing/Undressing What is the patient wearing?: Pull over shirt/dress Pull over shirt/dress - Perfomed by patient: Thread/unthread right sleeve, Thread/unthread left sleeve, Put head through opening, Pull shirt over trunk Assist Level: Set up Set up : To obtain clothing/put away Function - Lower Body Dressing/Undressing What is the patient wearing?: Pants, Underwear Position: Wheelchair/chair at sink Underwear - Performed by patient: Thread/unthread right underwear leg, Pull underwear up/down Underwear - Performed by helper: Thread/unthread left underwear leg Pants- Performed by patient: Thread/unthread right pants leg, Pull pants up/down, Fasten/unfasten pants Pants- Performed by helper: Thread/unthread left pants leg Non-skid slipper socks- Performed by patient: Don/doff right sock, Don/doff left sock Non-skid slipper socks- Performed by helper: Don/doff left sock, Don/doff right sock Assist for footwear: Partial/moderate assist Assist for lower body dressing: Touching or  steadying assistance (Pt > 75%)  Function - Toileting Toileting activity did not occur: No continent bowel/bladder event Toileting steps completed by patient: Adjust clothing prior to toileting, Performs perineal hygiene, Adjust clothing after toileting Toileting steps completed by helper: Adjust clothing prior to toileting, Performs perineal hygiene, Adjust clothing after toileting Assist level: Touching or steadying assistance (Pt.75%)  Function - Air cabin crew transfer activity did not occur: Refused Toilet transfer assistive device: Drop arm commode Assist level to toilet: Touching or steadying assistance (Pt > 75%) Assist level from toilet: Touching or steadying assistance (Pt > 75%) Assist level to bedside commode (at bedside): Moderate assist (Pt 50 - 74%/lift or lower) Assist level  from bedside commode (at bedside): Moderate assist (Pt 50 - 74%/lift or lower)  Function - Chair/bed transfer Chair/bed transfer method: Stand pivot, Ambulatory Chair/bed transfer assist level: Moderate assist (Pt 50 - 74%/lift or lower) Chair/bed transfer assistive device: Angela Burke, Armrests Chair/bed transfer details: Verbal cues for sequencing, Verbal cues for technique, Tactile cues for posture, Tactile cues for weight bearing  Function - Locomotion: Wheelchair Will patient use wheelchair at discharge?: No Type: Manual Max wheelchair distance: 100 Assist Level: Supervision or verbal cues Assist Level: Supervision or verbal cues Wheel 150 feet activity did not occur: Safety/medical concerns Assist Level: Supervision or verbal cues Turns around,maneuvers to table,bed, and toilet,negotiates 3% grade,maneuvers on rugs and over doorsills: No Function - Locomotion: Ambulation Assistive device: Cane-quad Max distance: 100 Assist level: Touching or steadying assistance (Pt > 75%) Assist level: Touching or steadying assistance (Pt > 75%) Assist level: Touching or steadying assistance  (Pt > 75%) Walk 150 feet activity did not occur: Safety/medical concerns Assist level: Supervision or verbal cues Walk 10 feet on uneven surfaces activity did not occur: Safety/medical concerns Assist level: Moderate assist (Pt 50 - 74%)  Function - Comprehension Comprehension: Auditory Comprehension assist level: Understands basic 50 - 74% of the time/ requires cueing 25 - 49% of the time  Function - Expression Expression: Verbal Expression assist level: Expresses basic 25 - 49% of the time/requires cueing 50 - 75% of the time. Uses single words/gestures.  Function - Social Interaction Social Interaction assist level: Interacts appropriately 75 - 89% of the time - Needs redirection for appropriate language or to initiate interaction.  Function - Problem Solving Problem solving assist level: Solves basic 50 - 74% of the time/requires cueing 25 - 49% of the time  Function - Memory Memory assist level: Recognizes or recalls 25 - 49% of the time/requires cueing 50 - 75% of the time Patient normally able to recall (first 3 days only): That he or she is in a hospital, Location of own room  Medical Problem List and Plan: 1.  Right hemiparesis, aphasia, apraxia and cognitive deficits secondary to Left frontal intracranial hypertensive hemorrhage  Continue CIR 2.  DVT Prophylaxis/Anticoagulation: Mechanical: Sequential compression devices, below knee Bilateral lower extremities 3. Pain Management: tylenol prn,   left ankle pain suspect sprain, x-ray reviewed, showing avulsed bony fragment distal tibia medial malleolus of indeterminate age 41. Mood:  Seems frustrated due to expressive/receptive deficits. Team to provide ego support. LCSW to follow for evaluation and support.   5. Neuropsych: This patient is not capable of making decisions on his own behalf. 6. Skin/Wound Care: routine pressure relief measures 7. Fluids/Electrolytes/Nutrition:  Monitor I/O.   BMP stable on 5/26 8.  CAD s/p  CABG: On coreg and crestor 9. Labile BP: Monitor BP. Lisinoprildiscontinued and Coreg dose down titrated.  Improved with IVF, DC'd 10. CKD: BUN/Creatinine 32/1.5 at admission and at baseline.     Creatinine 1.26 on 5/26  Will continue to monitor  Renal ultrasound reviewed suggesting medical renal disease disease  Continue to Encourage fluid intake.   11. Depression: Continue zoloft daily, increased on 5/22.   12. Hypothyroid: Resumed supplement.   13. LLL mass: CT chest reviewed from 5/24, suggesting potential malignancy.    Patient being followed by pulmonary and he monitored, who have communicated with the patient's daughter. Workup to be performed as outpatient. 14. Incontinence: Likely due to cognitive and processing issues--UA essentially neg except for some hematuria 15. Urinary retention- on uroxatral  Urine culture negative  on 5/21  Renal U/S reviewed suggesting medical renal disease, no obstruction.  Bethanechol 5 mg started 5/23, increased to 10 on 5/24, increased to 25 on 5/25, increased to 50 mg on 5/26. Cont to monitor, still requiring ICP 16. BPH: Cont Uroxatral 17. OSA : Continue CPAP  LOS (Days) 9 A FACE TO FACE EVALUATION WAS PERFORMED  KIRSTEINS,ANDREW E 09/04/2015, 10:05 AM   Diarrhea

## 2015-09-04 NOTE — Progress Notes (Signed)
Occupational Therapy Session Note  Patient Details  Name: David [name] MRN: [id number] Date of Birth: 1941/02/26  Today's Date: 09/04/2015 OT Individual Time: 2549-8264 and 1330-1430 OT Individual Time Calculation (min): 60 min and 60 min    Short Term Goals: Week 2:  OT Short Term Goal 1 (Week 2): STGs=LTGs secondary to upcoming discharge  Skilled Therapeutic Interventions/Progress Updates:    Session 1: OT session focused on functional transfers, standing balance, sequencing, and activity tolerance. Pt received supine in bed and had not had breakfast. Completed supine>sit with supervision then pt ate breakfast sitting unsupported EOB at supervision. Completed bathing at sink today requiring min A for sit<>stand 4/4x remaining in standing up to 1 minute. Pt required max multi-modal cues for task initiation and sequencing for bathing. Transitioned to carpeted room to simulate home environment. Pt completed functional mobility, ambulating apprx 50 feet with QC on carpet at CGA and increase time. Engaged in dynamic standing balance activities including side stepping left and right with overall CGA for balance. Pt returned to room and left sitting in w/c with all needs in reach.  Session 2: OT session focused on functional mobility, balance, activity tolerance, and strengthening. Pt received sitting in w/c. OT donned ankle brace then transitioned to ADL apartment. Practiced sit<>stand from low couch 5x with overall min A and increased time. Pt required rest break between each stand. Transitioned outside to increase affect and practice functional mobility on community surface. Pt ambulated around items for apprx 20 feet 3x with min A and use of quad cane. Pt required moderate verbal cues for sequencing with cane outside. Engaged in [place] with OT placing on low workload of 2 to increase functional endurance as pt completed for 11 minutes with 3 rest breaks. Pt returned to room and left sitting in w/c  with visitors present.  Therapy Documentation Precautions:  Precautions Precautions: Fall Precaution Comments: L ankle edema and pain with AROM and weight bearing Required Braces or Orthoses: Other Brace/Splint Other Brace/Splint: ASO  Restrictions Weight Bearing Restrictions: No General:   Vital Signs: Therapy Vitals Temp: 97.8 F (36.6 C) Temp Source: Axillary Pulse Rate: 75 Resp: 16 BP: 133/72 mmHg Oxygen Therapy SpO2: 94 % O2 Device: CPAP Pain: L ankle pain  See Function Navigator for Current Functional Status.   Therapy/Group: Individual Therapy  [name] 09/04/2015, 10:11 AM

## 2015-09-04 NOTE — Progress Notes (Signed)
RT placed patient on CPAP of 8. NO O2 bleed in needed. Patient tolerating well.

## 2015-09-05 NOTE — Progress Notes (Signed)
Subjective/Complaints: PT sitting at sink waiting for help with orofacial hygiene , moves BUEs but states he cannot do this independantly  Review systems limited by aphasia and cognition  Objective: Vital Signs: Blood pressure 130/74, pulse 76, temperature 97.9 F (36.6 C), temperature source Axillary, resp. rate 17, height 5' 9"  (1.753 m), weight 91.173 kg (201 lb), SpO2 95 %. No results found. Results for orders placed or performed during the hospital encounter of 08/26/15 (from the past 72 hour(s))  Basic metabolic panel     Status: Abnormal   Collection Time: 09/03/15  4:12 AM  Result Value Ref Range   Sodium 137 135 - 145 mmol/L   Potassium 4.4 3.5 - 5.1 mmol/L   Chloride 106 101 - 111 mmol/L   CO2 22 22 - 32 mmol/L   Glucose, Bld 92 65 - 99 mg/dL   BUN 36 (H) 6 - 20 mg/dL   Creatinine, Ser 1.26 (H) 0.61 - 1.24 mg/dL   Calcium 8.8 (L) 8.9 - 10.3 mg/dL   GFR calc non Af Amer 54 (L) >60 mL/min   GFR calc Af Amer >60 >60 mL/min    Comment: (NOTE) The eGFR has been calculated using the CKD EPI equation. This calculation has not been validated in all clinical situations. eGFR's persistently <60 mL/min signify possible Chronic Kidney Disease.    Anion gap 9 5 - 15  Basic metabolic panel     Status: Abnormal   Collection Time: 09/04/15  7:11 AM  Result Value Ref Range   Sodium 136 135 - 145 mmol/L   Potassium 4.4 3.5 - 5.1 mmol/L   Chloride 104 101 - 111 mmol/L   CO2 24 22 - 32 mmol/L   Glucose, Bld 100 (H) 65 - 99 mg/dL   BUN 31 (H) 6 - 20 mg/dL   Creatinine, Ser 1.08 0.61 - 1.24 mg/dL   Calcium 9.0 8.9 - 10.3 mg/dL   GFR calc non Af Amer >60 >60 mL/min   GFR calc Af Amer >60 >60 mL/min    Comment: (NOTE) The eGFR has been calculated using the CKD EPI equation. This calculation has not been validated in all clinical situations. eGFR's persistently <60 mL/min signify possible Chronic Kidney Disease.    Anion gap 8 5 - 15  CBC with Differential/Platelet     Status:  Abnormal   Collection Time: 09/04/15  7:11 AM  Result Value Ref Range   WBC 8.6 4.0 - 10.5 K/uL   RBC 4.07 (L) 4.22 - 5.81 MIL/uL   Hemoglobin 12.8 (L) 13.0 - 17.0 g/dL   HCT 37.5 (L) 39.0 - 52.0 %   MCV 92.1 78.0 - 100.0 fL   MCH 31.4 26.0 - 34.0 pg   MCHC 34.1 30.0 - 36.0 g/dL   RDW 14.4 11.5 - 15.5 %   Platelets 344 150 - 400 K/uL   Neutrophils Relative % 83 %   Neutro Abs 7.1 1.7 - 7.7 K/uL   Lymphocytes Relative 7 %   Lymphs Abs 0.6 (L) 0.7 - 4.0 K/uL   Monocytes Relative 7 %   Monocytes Absolute 0.6 0.1 - 1.0 K/uL   Eosinophils Relative 3 %   Eosinophils Absolute 0.3 0.0 - 0.7 K/uL   Basophils Relative 0 %   Basophils Absolute 0.0 0.0 - 0.1 K/uL    Gen. no acute distress. Vital signs reviewed. Well-developed, well-nourished. HENT: Normocephalic atraumatic Eyes: Conjunctivae and EOM normal Cardio: RRR and No murmur Resp: CTA B/L and unlabored GI: BS positive and  nondistended nontender Musc/Skel: No edema, no tenderness noted. Neuro:  Global aphasia, Expressive > receptive Alert and oriented x3 with increased time and options Able to perform demonstrated commands for the most part Motor: 4+/5 in the right deltoid, biceps, triceps, grip, hip flexor, knee extensor, ankle dorsiflex and plantar flexor.  5/5 on the left side in the same muscle groups., No ataxia. Skin:   Intact. Warm and dry.  Assessment/Plan: 1. Functional deficits secondary to Left frontal intracranial hypertensive hemorrhage  which require 3+ hours per day of interdisciplinary therapy in a comprehensive inpatient rehab setting. Physiatrist is providing close team supervision and 24 hour management of active medical problems listed below. Physiatrist and rehab team continue to assess barriers to discharge/monitor patient progress toward functional and medical goals. FIM: Function - Bathing Bathing activity did not occur: Refused Position: Shower Body parts bathed by patient: Right arm, Left arm,  Chest, Abdomen, Front perineal area, Right upper leg, Left upper leg, Buttocks, Right lower leg, Left lower leg Body parts bathed by helper: Back Bathing not applicable: Back Assist Level: Touching or steadying assistance(Pt > 75%)  Function- Upper Body Dressing/Undressing What is the patient wearing?: Pull over shirt/dress Pull over shirt/dress - Perfomed by patient: Thread/unthread right sleeve, Thread/unthread left sleeve, Put head through opening, Pull shirt over trunk Assist Level: Set up Set up : To obtain clothing/put away Function - Lower Body Dressing/Undressing What is the patient wearing?: Pants, Non-skid slipper socks Position: Wheelchair/chair at sink Underwear - Performed by patient: Thread/unthread right underwear leg, Pull underwear up/down Underwear - Performed by helper: Thread/unthread left underwear leg Pants- Performed by patient: Thread/unthread right pants leg, Pull pants up/down, Fasten/unfasten pants Pants- Performed by helper: Thread/unthread left pants leg Non-skid slipper socks- Performed by patient: Don/doff right sock, Don/doff left sock Non-skid slipper socks- Performed by helper: Don/doff left sock, Don/doff right sock Assist for footwear: Maximal assist Assist for lower body dressing: Touching or steadying assistance (Pt > 75%)  Function - Toileting Toileting activity did not occur: No continent bowel/bladder event Toileting steps completed by patient: Adjust clothing prior to toileting Toileting steps completed by helper: Adjust clothing prior to toileting, Performs perineal hygiene, Adjust clothing after toileting Toileting Assistive Devices: Grab bar or rail Assist level: Touching or steadying assistance (Pt.75%)  Function - Air cabin crew transfer activity did not occur: Refused Toilet transfer assistive device: Elevated toilet seat/BSC over toilet, Grab bar Assist level to toilet: Touching or steadying assistance (Pt > 75%) Assist level  from toilet: Touching or steadying assistance (Pt > 75%) Assist level to bedside commode (at bedside): Moderate assist (Pt 50 - 74%/lift or lower) Assist level from bedside commode (at bedside): Moderate assist (Pt 50 - 74%/lift or lower)  Function - Chair/bed transfer Chair/bed transfer method: Stand pivot, Ambulatory Chair/bed transfer assist level: Touching or steadying assistance (Pt > 75%) Chair/bed transfer assistive device: Angela Burke, Armrests Chair/bed transfer details: Verbal cues for sequencing, Verbal cues for technique, Tactile cues for posture, Tactile cues for weight bearing  Function - Locomotion: Wheelchair Will patient use wheelchair at discharge?: No Type: Manual Max wheelchair distance: 100 Assist Level: Supervision or verbal cues Assist Level: Supervision or verbal cues Wheel 150 feet activity did not occur: Safety/medical concerns Assist Level: Supervision or verbal cues Turns around,maneuvers to table,bed, and toilet,negotiates 3% grade,maneuvers on rugs and over doorsills: No Function - Locomotion: Ambulation Assistive device: Cane-quad Max distance: 100 Assist level: Touching or steadying assistance (Pt > 75%) Assist level: Touching or steadying assistance (Pt >  75%) Assist level: Touching or steadying assistance (Pt > 75%) Walk 150 feet activity did not occur: Safety/medical concerns Assist level: Supervision or verbal cues Walk 10 feet on uneven surfaces activity did not occur: Safety/medical concerns Assist level: Moderate assist (Pt 50 - 74%)  Function - Comprehension Comprehension: Auditory Comprehension assist level: Understands basic 50 - 74% of the time/ requires cueing 25 - 49% of the time  Function - Expression Expression: Verbal Expression assist level: Expresses basic 25 - 49% of the time/requires cueing 50 - 75% of the time. Uses single words/gestures.  Function - Social Interaction Social Interaction assist level: Interacts appropriately  75 - 89% of the time - Needs redirection for appropriate language or to initiate interaction.  Function - Problem Solving Problem solving assist level: Solves basic 25 - 49% of the time - needs direction more than half the time to initiate, plan or complete simple activities  Function - Memory Memory assist level: Recognizes or recalls 25 - 49% of the time/requires cueing 50 - 75% of the time Patient normally able to recall (first 3 days only): That he or she is in a hospital, Location of own room  Medical Problem List and Plan: 1.  Right hemiparesis, aphasia, apraxia and cognitive deficits secondary to Left frontal intracranial hypertensive hemorrhage  Continue CIR 2.  DVT Prophylaxis/Anticoagulation: Mechanical: Sequential compression devices, below knee Bilateral lower extremities 3. Pain Management: tylenol prn,    4. Mood:  Seems frustrated due to expressive/receptive deficits. Team to provide ego support. LCSW to follow for evaluation and support.   5. Neuropsych: This patient is not capable of making decisions on his own behalf. 6. Skin/Wound Care: routine pressure relief measures 7. Fluids/Electrolytes/Nutrition:  Monitor I/O.   BMP normal on 5/27 except mildly elevated BUN 8.  CAD s/p CABG: On coreg and crestor 9. Labile BP: Monitor BP. Lisinoprildiscontinued and Coreg dose down titrated.  Improved with IVF, DC'd 10. CKD: BUN/Creatinine 32/1.5 at admission and at baseline.     Creatinine 1.08 on 5/27  Will continue to monitor  Renal ultrasound reviewed suggesting medical renal disease disease  Continue to Encourage fluid intake.   11. Depression: Continue zoloft daily, increased on 5/22.   12. Hypothyroid: Resumed supplement.   13. LLL mass: CT chest reviewed from 5/24, suggesting potential malignancy.    Patient being followed by pulmonary and he monitored, who have communicated with the patient's daughter. Workup to be performed as outpatient. 14. Incontinence: Likely due  to cognitive and processing issues--UA essentially neg except for some hematuria 15. Urinary retention- on uroxatral  Urine culture negative on 5/21  Renal U/S reviewed suggesting medical renal disease, no obstruction.  Bethanechol 5 mg started 5/23, increased to 10 on 5/24, increased to 25 on 5/25, increased to 50 mg on 5/26. Cont to monitor, still requiring ICP 16. BPH: Cont Uroxatral, may need to go home with foley if no caregiver can be trained with ICP, will need OP uro f/u 17. OSA : Continue CPAP  LOS (Days) 10 A FACE TO FACE EVALUATION WAS PERFORMED  KIRSTEINS,ANDREW E 09/05/2015, 9:17 AM   Diarrhea

## 2015-09-05 NOTE — Progress Notes (Signed)
Placed patient on CPAP for the night with pressure set at 8cm  

## 2015-09-06 ENCOUNTER — Inpatient Hospital Stay (HOSPITAL_COMMUNITY): Payer: Medicare Other | Admitting: Speech Pathology

## 2015-09-06 ENCOUNTER — Inpatient Hospital Stay (HOSPITAL_COMMUNITY): Payer: Medicare Other | Admitting: Physical Therapy

## 2015-09-06 ENCOUNTER — Inpatient Hospital Stay (HOSPITAL_COMMUNITY): Payer: Medicare Other | Admitting: Occupational Therapy

## 2015-09-06 NOTE — Progress Notes (Signed)
Speech Language Pathology Daily Session Note  Patient Details  Name: David [name] MRN: [id number] Date of Birth: Feb 28, 1941  Today's Date: 09/06/2015 SLP Individual Time: 1000-1030 SLP Individual Time Calculation (min): 30 min  Short Term Goals: Week 2: SLP Short Term Goal 1 (Week 2): Pt will demonstrate >75% acc with Y/N?s with min A multimodal cues. SLP Short Term Goal 2 (Week 2): Pt will demonstrate O x4 with min A verbal and visual cues. SLP Short Term Goal 3 (Week 2): Pt will communicate basic needs/wants with mod A multimodal cues.  SLP Short Term Goal 4 (Week 2): Pt will demonstrate object naming >75% with mod A multimodal cues.  SLP Short Term Goal 5 (Week 2): Pt will demonstrate single word reading comprehension with min A multimodal cues.   Skilled Therapeutic Interventions: Pt seen for skilled SLP therapy with focus on spoken language, letter ID and word finding. Pt able to complete automatic speech tasks with min A for initiation with 100% acc. Letter ID 100% acc at mod I for increased time and self-correction. Pt able to generate words that begin with specific letters with mod A of semantic and phonemic cueing. Of note, discussed anxiety as a barrier with the pt. He increases his rate of breathing when he encounters difficulty- encouraged a slow deep breath to increase awareness of anxiety and actively work to dispel it. Pt verbalized understanding and agreement with dicussion re: anxiety.   Function:  Eating Eating                 Cognition Comprehension Comprehension assist level: Understands basic 50 - 74% of the time/ requires cueing 25 - 49% of the time  Expression   Expression assist level: Expresses basic 25 - 49% of the time/requires cueing 50 - 75% of the time. Uses single words/gestures.  Social Interaction Social Interaction assist level: Interacts appropriately 90% of the time - Needs monitoring or encouragement for participation or interaction.  Problem  Solving Problem solving assist level: Solves basic 50 - 74% of the time/requires cueing 25 - 49% of the time  Memory Memory assist level: Recognizes or recalls 50 - 74% of the time/requires cueing 25 - 49% of the time    Pain Pain Assessment No pain  Therapy/Group: Individual Therapy  Vinetta Bergamo MA, CCC-SLP 09/06/2015, 12:32 PM

## 2015-09-06 NOTE — Progress Notes (Signed)
Subjective/Complaints: Patient sitting up in bed this morning. He states he had a good weekend and a good night.  Review systems limited by aphasia and cognition  Objective: Vital Signs: Blood pressure 123/79, pulse 87, temperature 99 F (37.2 C), temperature source Axillary, resp. rate 17, height 5' 9"  (1.753 m), weight 91.173 kg (201 lb), SpO2 94 %. No results found. Results for orders placed or performed during the hospital encounter of 08/26/15 (from the past 72 hour(s))  Basic metabolic panel     Status: Abnormal   Collection Time: 09/04/15  7:11 AM  Result Value Ref Range   Sodium 136 135 - 145 mmol/L   Potassium 4.4 3.5 - 5.1 mmol/L   Chloride 104 101 - 111 mmol/L   CO2 24 22 - 32 mmol/L   Glucose, Bld 100 (H) 65 - 99 mg/dL   BUN 31 (H) 6 - 20 mg/dL   Creatinine, Ser 1.08 0.61 - 1.24 mg/dL   Calcium 9.0 8.9 - 10.3 mg/dL   GFR calc non Af Amer >60 >60 mL/min   GFR calc Af Amer >60 >60 mL/min    Comment: (NOTE) The eGFR has been calculated using the CKD EPI equation. This calculation has not been validated in all clinical situations. eGFR's persistently <60 mL/min signify possible Chronic Kidney Disease.    Anion gap 8 5 - 15  CBC with Differential/Platelet     Status: Abnormal   Collection Time: 09/04/15  7:11 AM  Result Value Ref Range   WBC 8.6 4.0 - 10.5 K/uL   RBC 4.07 (L) 4.22 - 5.81 MIL/uL   Hemoglobin 12.8 (L) 13.0 - 17.0 g/dL   HCT 37.5 (L) 39.0 - 52.0 %   MCV 92.1 78.0 - 100.0 fL   MCH 31.4 26.0 - 34.0 pg   MCHC 34.1 30.0 - 36.0 g/dL   RDW 14.4 11.5 - 15.5 %   Platelets 344 150 - 400 K/uL   Neutrophils Relative % 83 %   Neutro Abs 7.1 1.7 - 7.7 K/uL   Lymphocytes Relative 7 %   Lymphs Abs 0.6 (L) 0.7 - 4.0 K/uL   Monocytes Relative 7 %   Monocytes Absolute 0.6 0.1 - 1.0 K/uL   Eosinophils Relative 3 %   Eosinophils Absolute 0.3 0.0 - 0.7 K/uL   Basophils Relative 0 %   Basophils Absolute 0.0 0.0 - 0.1 K/uL    Gen. no acute distress. Vital signs  reviewed. Well-developed, well-nourished. HENT: Normocephalic atraumatic Eyes: Conjunctivae and EOM normal Cardio: RRR and No murmur Resp: CTA B/L and unlabored GI: BS positive and nondistended nontender Musc/Skel: No edema, no tenderness noted. Neuro:  Global aphasia, Expressive > receptive Alert and oriented x3 with increased time and options Able to perform demonstrated commands for the most part Motor: 4+-5/5 in the right deltoid, biceps, triceps, grip, hip flexor, knee extensor, ankle dorsiflex and plantar flexor.  5/5 on the left side in the same muscle groups., No ataxia. Skin:   Intact. Warm and dry.  Assessment/Plan: 1. Functional deficits secondary to Left frontal intracranial hypertensive hemorrhage  which require 3+ hours per day of interdisciplinary therapy in a comprehensive inpatient rehab setting. Physiatrist is providing close team supervision and 24 hour management of active medical problems listed below. Physiatrist and rehab team continue to assess barriers to discharge/monitor patient progress toward functional and medical goals. FIM: Function - Bathing Bathing activity did not occur: Refused Position: Wheelchair/chair at sink Body parts bathed by patient: Right arm, Left arm, Chest Body  parts bathed by helper: Abdomen, Front perineal area, Buttocks, Right upper leg, Left upper leg, Right lower leg, Left lower leg, Back Bathing not applicable: Back Assist Level: Touching or steadying assistance(Pt > 75%)  Function- Upper Body Dressing/Undressing What is the patient wearing?: Pull over shirt/dress Pull over shirt/dress - Perfomed by patient: Thread/unthread right sleeve, Thread/unthread left sleeve Pull over shirt/dress - Perfomed by helper: Put head through opening, Pull shirt over trunk Assist Level: Touching or steadying assistance(Pt > 75%) Set up : To obtain clothing/put away Function - Lower Body Dressing/Undressing What is the patient wearing?:  Pants Position: Wheelchair/chair at sink Underwear - Performed by patient: Thread/unthread right underwear leg, Pull underwear up/down Underwear - Performed by helper: Thread/unthread left underwear leg Pants- Performed by patient: Pull pants up/down Pants- Performed by helper: Thread/unthread right pants leg, Thread/unthread left pants leg, Fasten/unfasten pants Non-skid slipper socks- Performed by patient: Don/doff right sock, Don/doff left sock Non-skid slipper socks- Performed by helper: Don/doff left sock, Don/doff right sock Assist for footwear: Maximal assist Assist for lower body dressing: Touching or steadying assistance (Pt > 75%)  Function - Toileting Toileting activity did not occur: No continent bowel/bladder event Toileting steps completed by patient: Adjust clothing prior to toileting, Adjust clothing after toileting Toileting steps completed by helper: Performs perineal hygiene, Adjust clothing after toileting Toileting Assistive Devices: Grab bar or rail Assist level: Touching or steadying assistance (Pt.75%)  Function - Air cabin crew transfer activity did not occur: Refused Toilet transfer assistive device: Elevated toilet seat/BSC over toilet, Grab bar Assist level to toilet: Touching or steadying assistance (Pt > 75%) Assist level from toilet: Touching or steadying assistance (Pt > 75%) Assist level to bedside commode (at bedside): Moderate assist (Pt 50 - 74%/lift or lower) Assist level from bedside commode (at bedside): Moderate assist (Pt 50 - 74%/lift or lower)  Function - Chair/bed transfer Chair/bed transfer method: Stand pivot, Ambulatory Chair/bed transfer assist level: Touching or steadying assistance (Pt > 75%) Chair/bed transfer assistive device: Angela Burke, Armrests Chair/bed transfer details: Verbal cues for sequencing, Verbal cues for technique, Tactile cues for posture, Tactile cues for weight bearing  Function - Locomotion:  Wheelchair Will patient use wheelchair at discharge?: No Type: Manual Max wheelchair distance: 100 Assist Level: Supervision or verbal cues Assist Level: Supervision or verbal cues Wheel 150 feet activity did not occur: Safety/medical concerns Assist Level: Supervision or verbal cues Turns around,maneuvers to table,bed, and toilet,negotiates 3% grade,maneuvers on rugs and over doorsills: No Function - Locomotion: Ambulation Assistive device: Cane-quad Max distance: 100 Assist level: Touching or steadying assistance (Pt > 75%) Assist level: Touching or steadying assistance (Pt > 75%) Assist level: Touching or steadying assistance (Pt > 75%) Walk 150 feet activity did not occur: Safety/medical concerns Assist level: Supervision or verbal cues Walk 10 feet on uneven surfaces activity did not occur: Safety/medical concerns Assist level: Moderate assist (Pt 50 - 74%)  Function - Comprehension Comprehension: Auditory Comprehension assist level: Understands basic 50 - 74% of the time/ requires cueing 25 - 49% of the time  Function - Expression Expression: Verbal Expression assist level: Expresses basic 25 - 49% of the time/requires cueing 50 - 75% of the time. Uses single words/gestures.  Function - Social Interaction Social Interaction assist level: Interacts appropriately 75 - 89% of the time - Needs redirection for appropriate language or to initiate interaction.  Function - Problem Solving Problem solving assist level: Solves basic 50 - 74% of the time/requires cueing 25 - 49% of the  time  Function - Memory Memory assist level: Recognizes or recalls 25 - 49% of the time/requires cueing 50 - 75% of the time Patient normally able to recall (first 3 days only): That he or she is in a hospital  Medical Problem List and Plan: 1.  Right hemiparesis, aphasia, apraxia and cognitive deficits secondary to Left frontal intracranial hypertensive hemorrhage  Continue CIR 2.  DVT  Prophylaxis/Anticoagulation: Mechanical: Sequential compression devices, below knee Bilateral lower extremities 3. Pain Management: tylenol prn,    4. Mood:  Seems frustrated due to expressive/receptive deficits. Team to provide ego support. LCSW to follow for evaluation and support.   5. Neuropsych: This patient is not capable of making decisions on his own behalf. 6. Skin/Wound Care: routine pressure relief measures 7. Fluids/Electrolytes/Nutrition:  Monitor I/O.   BMP improving on 5/27 8.  CAD s/p CABG: On coreg and crestor 9. Labile BP: Monitor BP. Lisinoprildiscontinued and Coreg dose down titrated.  Improved with IVF, DC'd 10. CKD: BUN/Creatinine 32/1.5 at admission and at baseline.     Creatinine 1.08 on 5/27  Will continue to monitor  Renal ultrasound reviewed suggesting medical renal disease disease  Continue to Encourage fluid intake.   11. Depression: Continue zoloft daily, increased on 5/22.   12. Hypothyroid: Resumed supplement.   13. LLL mass: CT chest reviewed from 5/24, suggesting potential malignancy.    Patient being followed by pulmonary and he monitored, who have communicated with the patient's daughter. Workup to be performed as outpatient. 14. Incontinence: Likely due to cognitive and processing issues--UA essentially neg except for some hematuria 15. Urinary retention- on uroxatral  Urine culture negative on 5/21  Renal U/S reviewed suggesting medical renal disease, no obstruction.  Bethanechol 5 mg started 5/23, increased to 10 on 5/24, increased to 25 on 5/25, increased to 50 mg on 5/26. Cont to monitor, still requiring ICP, will monitor today and consider DC medication tomorrow if continues to be effective. 16. BPH: Cont Uroxatral, may need to go home with foley if no caregiver can be trained with ICP, will need OP uro f/u 17. OSA : Continue CPAP  LOS (Days) 11 A FACE TO FACE EVALUATION WAS PERFORMED  Ankit Lorie Phenix 09/06/2015, 9:07 AM   Diarrhea

## 2015-09-06 NOTE — Progress Notes (Addendum)
Speech Language Pathology Daily Session Notes  Patient Details  Name: David [name] MRN: [id number] Date of Birth: 04-Aug-1940  Today's Date: 09/06/2015  Session 1: SLP Individual Time: 1000-1030 SLP Individual Time Calculation (min): 30 min  Session 1: SLP Individual Time: 3557-3220 SLP Individual Time Calculation (min): 35 min  Short Term Goals: Week 2: SLP Short Term Goal 1 (Week 2): Pt will demonstrate >75% acc with Y/N?s with min A multimodal cues. SLP Short Term Goal 2 (Week 2): Pt will demonstrate O x4 with min A verbal and visual cues. SLP Short Term Goal 3 (Week 2): Pt will communicate basic needs/wants with mod A multimodal cues.  SLP Short Term Goal 4 (Week 2): Pt will demonstrate object naming >75% with mod A multimodal cues.  SLP Short Term Goal 5 (Week 2): Pt will demonstrate single word reading comprehension with min A multimodal cues.   Skilled Therapeutic Interventions:  Session 1: Skilled treatment session focused on functional communication. SLP facilitated session by providing extra time for patient to match pictures of objects and written words from a field of 3 with 100% accuracy and Mod A verbal and question cues for patient to match an object to the corresponding word from a field of 2 with 75% accuracy. Patient participated in a basic conversation in regards to his weekend with Min A verbal cues. Patient left upright in wheelchair with all needs within reach. Continue with current plan of care.   Session 2: Skilled treatment session focused on cognitive-linguistic goals. Upon arrival, patient was awake while supine in bed with family present. Patient transferred to the wheelchair with extra time and Min verbal cues needed for initiation and sequencing. SLP facilitated session by providing extra time for patient to sequence 4 step picture cards with 100% accuracy and Mod I. Patient then answered questions in regards to activities within the pictures at the word and  phrase level with Mod-Max A multimodal cues. Patient left upright in wheelchair with family present. Continue with current plan of care.   Function:  Cognition Comprehension Comprehension assist level: Understands basic 50 - 74% of the time/ requires cueing 25 - 49% of the time  Expression   Expression assist level: Expresses basic 25 - 49% of the time/requires cueing 50 - 75% of the time. Uses single words/gestures.  Social Interaction Social Interaction assist level: Interacts appropriately 90% of the time - Needs monitoring or encouragement for participation or interaction.  Problem Solving Problem solving assist level: Solves basic 50 - 74% of the time/requires cueing 25 - 49% of the time  Memory Memory assist level: Recognizes or recalls 50 - 74% of the time/requires cueing 25 - 49% of the time    Pain Pain Assessment Pain Assessment: No/denies pain  Therapy/Group: Individual Therapy  David [name] 09/06/2015, 12:30 PM

## 2015-09-06 NOTE — Progress Notes (Signed)
Physical Therapy Session Note  Patient Details  Name: David [name] MRN: [id number] Date of Birth: Sep 18, 1940  Today's Date: 09/06/2015 PT Individual Time: 0915-1001 and 9163-8466 PT Individual Time Calculation (min): 46 min and 30 min   Short Term Goals: Week 2:  PT Short Term Goal 1 (Week 2): =LTGs  Skilled Therapeutic Interventions/Progress Updates:    Treatment 1: Pt received in w/c with L ASO donned & agreeable to PT, denying c/o pain. Transported pt room>gym via w/c total A for time management. Pt reports he feels more comfortable using RW for ambulation compared to [facility]. Attempted stair negotiation forwards & backwards with RW but pt demonstrates increased anxiety with task. Switched to using [facility] & attempted negotiation with various sequences. Pt appears most comfortable by ascending stairs with LBQC>RLE>LLE and descending with LBQC>LLE>RLE. Completed stair negotiation multiple times, cuing pt to not lean on rails with forearms and for sequencing. Pt required min A fade to close supervision & still demonstrates anxiety with task. Pt completed car transfer at small SUV simulated height by ambulating to car with [facility] & supervision A. Pt able to transfer in/out of car with supervision, requiring cuing to sit down on seat then transfer BLE into vehicle. Pt returned to w/c & transported to room via w/c total A. Pt left in w/c with QRB in place & vitals assessed due to pt not appearing to feel well; HR = 87 bpm, SpO2 = 93% on room air, BP = 114/74 mmHg. Pt left in handoff to SLP.  Treatment 2: Pt received in w/c & agreeable to PT, denying c/o pain. Gait training x 200 ft with RW & steady A fade to supervision A. Pt with decreased hip flexion during swing through phase LLE & decreased step length BLE. With verbal cuing to increase step length BLE pt able to do so ~30% of the time. Utilized nu-step up to Level 5 x 12 minutes for cardiovascular endurance training, with pt reporting he's working "medium  to hard".  Gait training x 100 ft with RW & Supervision + 50 ft without AD & Min A. When ambulating without AD pt with decreased step length RLE & increased lateral sway. At end of session pt left in w/c with QRB in place & all needs within reach.   Therapy Documentation Precautions:  Precautions Precautions: Fall Precaution Comments: L ankle edema and pain with AROM and weight bearing Required Braces or Orthoses: Other Brace/Splint Other Brace/Splint: ASO  Restrictions Weight Bearing Restrictions: No  Pain: Pain Assessment Pain Assessment: No/denies pain    See Function Navigator for Current Functional Status.   Therapy/Group: Individual Therapy  Waunita Schooner 09/06/2015, 7:52 AM

## 2015-09-06 NOTE — Progress Notes (Signed)
Occupational Therapy Session Note  Patient Details  Name: David [name] MRN: [id number] Date of Birth: 1941-03-23  Today's Date: 09/06/2015 OT Individual Time:  - 1115-1215  (60 min)      Short Term Goals: Week 1:  OT Short Term Goal 1 (Week 1): Pt will perform toilet transfer with min A in order to decrease level of assist needed for functional transfers.  OT Short Term Goal 1 - Progress (Week 1): Met OT Short Term Goal 2 (Week 1): Pt will engage in 15 minutes of functional activity with 2 or less rest breaks secondary to fatigue. OT Short Term Goal 2 - Progress (Week 1): Met OT Short Term Goal 3 (Week 1): Pt will perform LB dressing with mod A in order to decrease level of assist with functional tasks.  OT Short Term Goal 3 - Progress (Week 1): Met OT Short Term Goal 4 (Week 1): Pt will perform shower transfer with min A in order to decrease level of assist for functional transfers.  OT Short Term Goal 4 - Progress (Week 1): Met Week 2:  OT Short Term Goal 1 (Week 2): STGs=LTGs secondary to upcoming discharge      Skilled Therapeutic Interventions/Progress Updates:    Pt sitting EOB.  He had transferred from wc to EOB after removing his safety belt.  OT and nursing educated him on using call bell to let us know when he needed something.  Pt agreed to shower.  Ambulated with quad cane to toilet  Was continent of bowel and bladder.  Undressed and ambulated to TTB.  Pt having trouble with expressive component of speech and increased time for getting his message across.  Pt  Demonstrated decreased eccentric control when going to TTB and toilet.  He needed cues for organizing and sequencing his bath.  He washed a dry head but was cued and was able to correct with minimal assist.  He needed cues throughout session for attention to activity.  Ambulated to bed and returned to supine with all needs in reach.    Therapy Documentation Precautions:  Precautions Precautions: Fall Precaution  Comments: L ankle edema and pain with AROM and weight bearing Required Braces or Orthoses: Other Brace/Splint Other Brace/Splint: ASO  Restrictions Weight Bearing Restrictions: No      Pain:  All over           See Function Navigator for Current Functional Status.   Therapy/Group: Individual Therapy  Lisa Roca 09/06/2015, 7:59 AM

## 2015-09-07 ENCOUNTER — Inpatient Hospital Stay (HOSPITAL_COMMUNITY): Payer: Medicare Other | Admitting: Speech Pathology

## 2015-09-07 ENCOUNTER — Inpatient Hospital Stay (HOSPITAL_COMMUNITY): Payer: Medicare Other

## 2015-09-07 ENCOUNTER — Inpatient Hospital Stay (HOSPITAL_COMMUNITY): Payer: Medicare Other | Admitting: Physical Therapy

## 2015-09-07 NOTE — Progress Notes (Signed)
Physical Therapy Session Note  Patient Details  Name: David [name] MRN: [id number] Date of Birth: Apr 02, 1941  Today's Date: 09/07/2015 PT Individual Time: 0905-1000 PT Individual Time Calculation (min): 55 min   Short Term Goals: Week 2:  PT Short Term Goal 1 (Week 2): =LTGs  Skilled Therapeutic Interventions/Progress Updates:    Pt received resting in bed, no c/o pain, and agreeable to therapy.  Pt does report some discomfort in L ankle throughout session, describes it as "medium" and declines intervention for pain.  Session focus on initiation, transfers, balance, gait, and stair negotiation.    Pt transfers throughout session from a variety of surfaces with RW and supervision.  Pt continues to require min>mod verbal cues for initiation and safe placement of hands throughout session.   Pt amb throughout unit, room>therapy gym>ortho gym>room, max 300' with RW and supervision.  Pt demonstrates antalgic gait pattern, RW>LLE>RLE with decreased LLE weight shift and weight bearing.  PT instructed pt in negotiation of 2x4 steps with 2 handrails with supervision.  PT instructed pt in LE therex for improved balance including 2x10 heel/toe raises in standing and minisquats, side stepping x30' each direction, and backwards walking x30'.    PT provided rest breaks as needed, removed kinesiotape from L ankle, and provided ice to L ankle at end of session.    Pt returned to room at end of session and left upright in w/c with call bell in reach and needs met.   Therapy Documentation Precautions:  Precautions Precautions: Fall Precaution Comments: L ankle edema and pain with AROM and weight bearing Required Braces or Orthoses: Other Brace/Splint Other Brace/Splint: ASO  Restrictions Weight Bearing Restrictions: No   See Function Navigator for Current Functional Status.   Therapy/Group: Individual Therapy  Earnest Conroy Penven-Crew 09/07/2015, 9:46 AM

## 2015-09-07 NOTE — Progress Notes (Signed)
Occupational Therapy Session Note  Patient Details  Name: David [name] MRN: [id number] Date of Birth: 08/17/1940  Today's Date: 09/07/2015 OT Individual Time: 1000-1100 OT Individual Time Calculation (min): 60 min    Short Term Goals: Week 1:  OT Short Term Goal 1 (Week 1): Pt will perform toilet transfer with min A in order to decrease level of assist needed for functional transfers.  OT Short Term Goal 1 - Progress (Week 1): Met OT Short Term Goal 2 (Week 1): Pt will engage in 15 minutes of functional activity with 2 or less rest breaks secondary to fatigue. OT Short Term Goal 2 - Progress (Week 1): Met OT Short Term Goal 3 (Week 1): Pt will perform LB dressing with mod A in order to decrease level of assist with functional tasks.  OT Short Term Goal 3 - Progress (Week 1): Met OT Short Term Goal 4 (Week 1): Pt will perform shower transfer with min A in order to decrease level of assist for functional transfers.  OT Short Term Goal 4 - Progress (Week 1): Met  Skilled Therapeutic Interventions/Progress Updates:    Pt engaged in BADL retraining including bathing at shower level and dressing with sit<>stand from seat.  Pt required max multimodal cues for task initiation and sequencing for all tasks.  Pt required assistance with LB bathing and donning socks, stating "I can't." Pt required more than a reasonable amount of time to complete tasks.  Pt amb to/from bathroom with HHA/min A.  Focus on BADL retraining, task initiation, sequencing, functional amb with HHA, and safety awareness to increase independence with BADLs.  Therapy Documentation Precautions:  Precautions Precautions: Fall Precaution Comments: L ankle edema and pain with AROM and weight bearing Required Braces or Orthoses: Other Brace/Splint Other Brace/Splint: ASO  Restrictions Weight Bearing Restrictions: No Pain:  Pt denied pain  See Function Navigator for Current Functional Status.   Therapy/Group: Individual  Therapy  Leroy Libman 09/07/2015, 11:21 AM

## 2015-09-07 NOTE — Progress Notes (Signed)
Occupational Therapy Note  Patient Details  Name: David [name] MRN: [id number] Date of Birth: Nov 21, 1940  Today's Date: 09/07/2015 OT Individual Time: 1300-1330 OT Individual Time Calculation (min): 30 min   Pt denied pain Individual therapy  Pt resting in bed upon arrival and agreeable to therapy.  Pt sat EOB at supervision level but required min A and max multimodal cues for sit>stand from EOB to transfer to w/c.  Pt engaged in table activity with pipe tree.  Pt was able to assemble three structures with increasing complexity after appropriate pieces were provided. Pt remained in w/c with QRB in place and all needs within reach.   Leotis Shames [facility] 09/07/2015, 2:24 PM

## 2015-09-07 NOTE — Progress Notes (Signed)
Social Work Patient ID: David [name], male   DOB: 09-Apr-1941, 75 y.o.   MRN: [id number]   CSW spoke with pt's dtr re: options for pt to have support at d/c, including SNF at [facility] vs. 24/7 paid caregivers in his independent apartment at [facility].  Dtr feels pt going to the Rehab at [facility] is in pt's best interest, giving him more time and more therapy before going home.  CSW to talk pt and his wife tomorrow re: this.  Family plans to have hand rails installed at pt's home and will begin making plans to have 24/7 supervision when he leaves the SNF.  Dtr had other medical questions related to PET scan and records, so CSW to talk with PA and relay information to the dtr.  CSW will begin process to send information to [facility].

## 2015-09-07 NOTE — Progress Notes (Signed)
Speech Language Pathology Daily Session Notes  Patient Details  Name: David [name] MRN: [id number] Date of Birth: Aug 12, 1940  Today's Date: 09/07/2015  Session 1: SLP Individual Time: 1130-1200 SLP Individual Time Calculation (min): 30 min   Session 2: SLP Individual Time: 0762-2633 SLP Individual Time Calculation (min): 30 min  Short Term Goals: Week 2: SLP Short Term Goal 1 (Week 2): Pt will demonstrate >75% acc with Y/N?s with min A multimodal cues. SLP Short Term Goal 2 (Week 2): Pt will demonstrate O x4 with min A verbal and visual cues. SLP Short Term Goal 3 (Week 2): Pt will communicate basic needs/wants with mod A multimodal cues.  SLP Short Term Goal 4 (Week 2): Pt will demonstrate object naming >75% with mod A multimodal cues.  SLP Short Term Goal 5 (Week 2): Pt will demonstrate single word reading comprehension with min A multimodal cues.   Skilled Therapeutic Interventions:  Session 1: Skilled treatment session focused on dysphagia goals. SLP facilitated session by providing supervision verbal cues for use of swallowing compensatory strategies of small bites due to impulsivity with lunch meal of regular textures and thin liquids. Patient consumed meal without overt s/s of aspiration. Recommend patient continue current diet. Patient transferred back to bed at end of session with all needs within reach. Continue with current plan of care.   Session 2: Skilled treatment session focused on functional communication. Upon arrival, patient was asleep while supine in bed but awakened to voice. It was apparent that the patient has transferred himself back to bed from the wheelchair based on the position of the leg rests with the quick release belt still intact (slid over his head). RN made aware. SLP facilitated session by providing Max A multimodal cues for patient to complete sentence completion task with use of a visual aid with 40% accuracy. Patient also wrote his name, the date and  where he was (hospital) with extra time and Mod-Max A verbal and visual cues. Patient left upright in wheelchair with family present. Continue with current plan of care.   Function:  Eating Eating   Modified Consistency Diet: No Eating Assist Level: Supervision or verbal cues           Cognition Comprehension Comprehension assist level: Understands basic 50 - 74% of the time/ requires cueing 25 - 49% of the time  Expression   Expression assist level: Expresses basic 25 - 49% of the time/requires cueing 50 - 75% of the time. Uses single words/gestures.  Social Interaction Social Interaction assist level: Interacts appropriately 90% of the time - Needs monitoring or encouragement for participation or interaction.  Problem Solving Problem solving assist level: Solves basic 50 - 74% of the time/requires cueing 25 - 49% of the time  Memory Memory assist level: Recognizes or recalls 75 - 89% of the time/requires cueing 10 - 24% of the time    Pain    Therapy/Group: Individual Therapy  Zayed Griffie, [place] 09/07/2015, 12:19 PM

## 2015-09-07 NOTE — Progress Notes (Signed)
Subjective/Complaints: Patient lying in bed this morning. He is easily arousable. He states stimulable.  Review systems limited by aphasia and cognition  Objective: Vital Signs: Blood pressure 116/81, pulse 80, temperature 98.2 F (36.8 C), temperature source Oral, resp. rate 18, height '5\' 9"'$  (1.753 m), weight 91.173 kg (201 lb), SpO2 97 %. No results found. No results found for this or any previous visit (from the past 72 hour(s)).  Gen. no acute distress. Vital signs reviewed. Well-developed, well-nourished. HENT: Normocephalic atraumatic Eyes: Conjunctivae and EOM normal Cardio: RRR and No murmur Resp: CTA B/L and unlabored GI: BS positive and nondistended nontender Musc/Skel: No edema, no tenderness noted. Neuro:  Global aphasia, Expressive > receptive Alert and oriented x3 with increased time and options Able to perform demonstrated commands for the most part Motor: 4+-5/5 in the right deltoid, biceps, triceps, grip, hip flexor, knee extensor, ankle dorsiflex and plantar flexor.  5/5 on the left side in the same muscle groups., No ataxia. Skin:   Intact. Warm and dry.  Assessment/Plan: 1. Functional deficits secondary to Left frontal intracranial hypertensive hemorrhage  which require 3+ hours per day of interdisciplinary therapy in a comprehensive inpatient rehab setting. Physiatrist is providing close team supervision and 24 hour management of active medical problems listed below. Physiatrist and rehab team continue to assess barriers to discharge/monitor patient progress toward functional and medical goals. FIM: Function - Bathing Bathing activity did not occur: Refused Position: Shower Body parts bathed by patient: Right arm, Left arm, Abdomen, Chest, Front perineal area, Buttocks, Right upper leg, Left upper leg Body parts bathed by helper: Left lower leg, Right lower leg, Back Bathing not applicable: Back Assist Level: Touching or steadying assistance(Pt >  75%)  Function- Upper Body Dressing/Undressing What is the patient wearing?: Pull over shirt/dress Pull over shirt/dress - Perfomed by patient: Thread/unthread right sleeve, Thread/unthread left sleeve, Put head through opening Pull over shirt/dress - Perfomed by helper: Put head through opening, Pull shirt over trunk Assist Level: Touching or steadying assistance(Pt > 75%) Set up : To obtain clothing/put away Function - Lower Body Dressing/Undressing What is the patient wearing?: Pants, Non-skid slipper socks Position: Standing at sink Underwear - Performed by patient: Thread/unthread right underwear leg, Pull underwear up/down Underwear - Performed by helper: Thread/unthread left underwear leg Pants- Performed by patient: Thread/unthread right pants leg, Thread/unthread left pants leg Pants- Performed by helper: Pull pants up/down Non-skid slipper socks- Performed by patient: Don/doff right sock, Don/doff left sock (able to don) Non-skid slipper socks- Performed by helper: Don/doff left sock, Don/doff right sock (unable to don) Assist for footwear: Partial/moderate assist Assist for lower body dressing: Touching or steadying assistance (Pt > 75%)  Function - Toileting Toileting activity did not occur: No continent bowel/bladder event Toileting steps completed by patient: Performs perineal hygiene, Adjust clothing prior to toileting Toileting steps completed by helper: Adjust clothing after toileting Toileting Assistive Devices: Grab bar or rail Assist level: Touching or steadying assistance (Pt.75%)  Function - Air cabin crew transfer activity did not occur: Refused Toilet transfer assistive device: Grab bar, Elevated toilet seat/BSC over toilet Assist level to toilet: Moderate assist (Pt 50 - 74%/lift or lower) Assist level from toilet: Moderate assist (Pt 50 - 74%/lift or lower) Assist level to bedside commode (at bedside): Moderate assist (Pt 50 - 74%/lift or  lower) Assist level from bedside commode (at bedside): Moderate assist (Pt 50 - 74%/lift or lower)  Function - Chair/bed transfer Chair/bed transfer method: Stand pivot Chair/bed transfer assist level:  Touching or steadying assistance (Pt > 75%) Chair/bed transfer assistive device: Armrests Chair/bed transfer details: Verbal cues for sequencing  Function - Locomotion: Wheelchair Will patient use wheelchair at discharge?: No Type: Manual Max wheelchair distance: 100 Assist Level: Supervision or verbal cues Assist Level: Supervision or verbal cues Wheel 150 feet activity did not occur: Safety/medical concerns Assist Level: Supervision or verbal cues Turns around,maneuvers to table,bed, and toilet,negotiates 3% grade,maneuvers on rugs and over doorsills: No Function - Locomotion: Ambulation Assistive device: Walker-rolling Max distance: 200 ft Assist level: Supervision or verbal cues Assist level: Supervision or verbal cues Assist level: Supervision or verbal cues Walk 150 feet activity did not occur: Safety/medical concerns Assist level: Supervision or verbal cues Walk 10 feet on uneven surfaces activity did not occur: Safety/medical concerns Assist level: Moderate assist (Pt 50 - 74%)  Function - Comprehension Comprehension: Auditory Comprehension assist level: Understands basic 50 - 74% of the time/ requires cueing 25 - 49% of the time  Function - Expression Expression: Verbal Expression assist level: Expresses basic 25 - 49% of the time/requires cueing 50 - 75% of the time. Uses single words/gestures.  Function - Social Interaction Social Interaction assist level: Interacts appropriately 90% of the time - Needs monitoring or encouragement for participation or interaction.  Function - Problem Solving Problem solving assist level: Solves basic 50 - 74% of the time/requires cueing 25 - 49% of the time  Function - Memory Memory assist level: Recognizes or recalls 50 - 74% of  the time/requires cueing 25 - 49% of the time Patient normally able to recall (first 3 days only): That he or she is in a hospital  Medical Problem List and Plan: 1.  Right hemiparesis, aphasia, apraxia and cognitive deficits secondary to Left frontal intracranial hypertensive hemorrhage  Continue CIR 2.  DVT Prophylaxis/Anticoagulation: Mechanical: Sequential compression devices, below knee Bilateral lower extremities 3. Pain Management: tylenol prn  4. Mood:  Seems frustrated due to expressive/receptive deficits. Team to provide ego support. LCSW to follow for evaluation and support.   5. Neuropsych: This patient is not capable of making decisions on his own behalf. 6. Skin/Wound Care: routine pressure relief measures 7. Fluids/Electrolytes/Nutrition:  Monitor I/O.   BMP improving on 5/27 8.  CAD s/p CABG: On coreg and crestor 9. Labile BP: Monitor BP. Lisinoprildiscontinued and Coreg dose down titrated.  Improved with IVF, DC'd 10. CKD: BUN/Creatinine 32/1.5 at admission and at baseline.     Creatinine 1.08 on 5/27  Will continue to monitor  Renal ultrasound reviewed suggesting medical renal disease disease  Continue to Encourage fluid intake.   11. Depression: Continue zoloft daily, increased on 5/22.   12. Hypothyroid: Resumed supplement.   13. LLL mass: CT chest reviewed from 5/24, suggesting potential malignancy.    Patient being followed by pulmonary and he monitored, who have communicated with the patient's daughter. Workup to be performed as outpatient. 14. Incontinence: Likely due to cognitive and processing issues--UA essentially neg except for some hematuria 15. Urinary retention- on uroxatral  Urine culture negative on 5/21  Renal U/S reviewed suggesting medical renal disease, no obstruction.  Bethanechol 5 mg started 5/23, increased to 10 on 5/24, increased to 25 on 5/25, increased to 50 mg on 5/26. Cont to monitor, still requiring ICP, ? Appears to be improving based on  documentation. We'll continue to follow. 16. BPH: Cont Uroxatral, may need to go home with foley if no caregiver can be trained with ICP, will need OP uro f/u 17. OSA :  Continue CPAP  LOS (Days) 12 A FACE TO FACE EVALUATION WAS PERFORMED  David [name] David [name] 09/07/2015, 8:11 AM   Diarrhea

## 2015-09-08 ENCOUNTER — Inpatient Hospital Stay (HOSPITAL_COMMUNITY): Payer: Medicare Other | Admitting: Speech Pathology

## 2015-09-08 ENCOUNTER — Inpatient Hospital Stay (HOSPITAL_COMMUNITY): Payer: Medicare Other | Admitting: Occupational Therapy

## 2015-09-08 ENCOUNTER — Inpatient Hospital Stay (HOSPITAL_COMMUNITY): Payer: Medicare Other | Admitting: Physical Therapy

## 2015-09-08 MED ORDER — BETHANECHOL CHLORIDE 25 MG PO TABS
25.0000 mg | ORAL_TABLET | Freq: Three times a day (TID) | ORAL | Status: DC
  Administered 2015-09-08 (×3): 25 mg via ORAL
  Filled 2015-09-08 (×3): qty 1

## 2015-09-08 NOTE — Progress Notes (Signed)
Physical Therapy Session Note  Patient Details  Name: David [name] MRN: [id number] Date of Birth: 1940-04-17  Today's Date: 09/08/2015 PT Individual Time: 1430-1500 PT Individual Time Calculation (min): 30 min   Short Term Goals: Week 2:  PT Short Term Goal 1 (Week 2): =LTGs  Skilled Therapeutic Interventions/Progress Updates:    Pt received resting in bed, no c/o pain, and agreeable to therapy session.    Pt transfers throughout session and ambulates throughout unit with close supervision with RW.  Occasional verbal cues for hand placement during transfer.    PT instructed pt in RLE and LLE tapping target and coming back to center 3x4 each side for balance.  Pt performed with HHA on L and min assist overall to maintain balance.  PT instructed pt in functional task, putting clean pillowcases on pillows which pt was able to do with increased time and min verbal cues.   Handoff to SLP in speech office at completion of session.   Therapy Documentation Precautions:  Precautions Precautions: Fall Precaution Comments: L ankle edema and pain with AROM and weight bearing Required Braces or Orthoses: Other Brace/Splint Other Brace/Splint: ASO  Restrictions Weight Bearing Restrictions: No   See Function Navigator for Current Functional Status.   Therapy/Group: Individual Therapy  Earnest Conroy Penven-Crew 09/08/2015, 2:59 PM

## 2015-09-08 NOTE — Progress Notes (Signed)
Occupational Therapy Session Note  Patient Details  Name: Ranvir Renovato MRN: [id number] Date of Birth: [birth year]  Today's Date: 09/08/2015 OT Individual Time: 0705-0800 OT Individual Time Calculation (min): 55 min    Short Term Goals: Week 2:  OT Short Term Goal 1 (Week 2): STGs=LTGs secondary to upcoming discharge  Skilled Therapeutic Interventions/Progress Updates:  Upon entering the room, pt supine in bed with no c/o pain this session. Pt declined bathing multiple times throughout session. Pt performed supine >sit with supervision and short ambulation to toilet with steady assist and use of quad cane. Pt performed toilet transfer with supervision onto standard toilet. Pt unable to void during this session. Pt agreeable to don clean pants from toilet with steady assist for balance to manage clothing. Pt returning to sink for grooming tasks while standing with mod multimodal cues for functional routine tasks during this session with increased time. Pt seated on EOB for breakfast with min verbal cues needed for small bites of food and to utilize utensils with some food items as pt attempts to pick up french toast with fingers. Pt returns to bed at end of session with call bell and all needed items within reach upon exiting the room.   Therapy Documentation Precautions:  Precautions Precautions: Fall Precaution Comments: L ankle edema and pain with AROM and weight bearing Required Braces or Orthoses: Other Brace/Splint Other Brace/Splint: ASO  Restrictions Weight Bearing Restrictions: No  See Function Navigator for Current Functional Status.   Therapy/Group: Individual Therapy  [name] 09/08/2015, 12:57 PM

## 2015-09-08 NOTE — Progress Notes (Signed)
Subjective/Complaints: Patient seen lying in bed this morning. He continues to be pleasant and denies complaints.  Review systems limited by aphasia and cognition  Objective: Vital Signs: Blood pressure 124/81, pulse 85, temperature 97.6 F (36.4 C), temperature source Oral, resp. rate 18, height '5\' 9"'$  (1.753 m), weight 86.728 kg (191 lb 3.2 oz), SpO2 96 %. No results found. No results found for this or any previous visit (from the past 72 hour(s)).  Gen. no acute distress. Vital signs reviewed. Well-developed, well-nourished. HENT: Normocephalic atraumatic Eyes: Conjunctivae and EOM normal Cardio: RRR and No murmur Resp: CTA B/L and unlabored GI: BS positive and nondistended nontender Musc/Skel: No edema, no tenderness noted. Neuro:  Global aphasia, Expressive > receptive Alert and oriented x3 with increased time and options Able to perform demonstrated commands for the most part Motor: 4+/5 in the right deltoid, biceps, triceps, grip, hip flexor, knee extensor, ankle dorsiflex and plantar flexor.  5/5 on the left side in the same muscle groups., No ataxia. Skin:   Intact. Warm and dry.  Assessment/Plan: 1. Functional deficits secondary to Left frontal intracranial hypertensive hemorrhage  which require 3+ hours per day of interdisciplinary therapy in a comprehensive inpatient rehab setting. Physiatrist is providing close team supervision and 24 hour management of active medical problems listed below. Physiatrist and rehab team continue to assess barriers to discharge/monitor patient progress toward functional and medical goals. FIM: Function - Bathing Bathing activity did not occur: Refused Position: Shower Body parts bathed by patient: Right arm, Left arm, Abdomen, Chest, Front perineal area, Buttocks, Right upper leg, Left upper leg, Back Body parts bathed by helper: Left lower leg, Right lower leg Bathing not applicable: Back Assist Level: Touching or steadying  assistance(Pt > 75%)  Function- Upper Body Dressing/Undressing What is the patient wearing?: Pull over shirt/dress Pull over shirt/dress - Perfomed by patient: Thread/unthread right sleeve, Thread/unthread left sleeve, Put head through opening, Pull shirt over trunk Pull over shirt/dress - Perfomed by helper: Put head through opening, Pull shirt over trunk Assist Level: Set up, Supervision or verbal cues Set up : To obtain clothing/put away Function - Lower Body Dressing/Undressing What is the patient wearing?: Pants, Non-skid slipper socks Position: Wheelchair/chair at sink Underwear - Performed by patient: Thread/unthread right underwear leg, Pull underwear up/down Underwear - Performed by helper: Thread/unthread left underwear leg Pants- Performed by patient: Thread/unthread right pants leg, Thread/unthread left pants leg, Pull pants up/down Pants- Performed by helper: Pull pants up/down Non-skid slipper socks- Performed by patient: Don/doff right sock, Don/doff left sock (able to don) Non-skid slipper socks- Performed by helper: Don/doff left sock, Don/doff right sock Assist for footwear: Maximal assist Assist for lower body dressing: Touching or steadying assistance (Pt > 75%)  Function - Toileting Toileting activity did not occur: No continent bowel/bladder event Toileting steps completed by patient: Adjust clothing prior to toileting, Performs perineal hygiene Toileting steps completed by helper: Adjust clothing after toileting Toileting Assistive Devices: Grab bar or rail Assist level: Touching or steadying assistance (Pt.75%)  Function - Air cabin crew transfer activity did not occur: Refused Toilet transfer assistive device: Grab bar, Elevated toilet seat/BSC over toilet Assist level to toilet: Moderate assist (Pt 50 - 74%/lift or lower) Assist level from toilet: Moderate assist (Pt 50 - 74%/lift or lower) Assist level to bedside commode (at bedside): Moderate  assist (Pt 50 - 74%/lift or lower) Assist level from bedside commode (at bedside): Moderate assist (Pt 50 - 74%/lift or lower)  Function - Chair/bed transfer Chair/bed  transfer method: Ambulatory, Stand pivot Chair/bed transfer assist level: Supervision or verbal cues Chair/bed transfer assistive device: Armrests, Walker Chair/bed transfer details: Verbal cues for technique  Function - Locomotion: Wheelchair Will patient use wheelchair at discharge?: No Type: Manual Max wheelchair distance: 100 Assist Level: Supervision or verbal cues Assist Level: Supervision or verbal cues Wheel 150 feet activity did not occur: Safety/medical concerns Assist Level: Supervision or verbal cues Turns around,maneuvers to table,bed, and toilet,negotiates 3% grade,maneuvers on rugs and over doorsills: No Function - Locomotion: Ambulation Assistive device: Walker-rolling Max distance: 300 Assist level: Supervision or verbal cues Assist level: Supervision or verbal cues Assist level: Supervision or verbal cues Walk 150 feet activity did not occur: Safety/medical concerns Assist level: Supervision or verbal cues Walk 10 feet on uneven surfaces activity did not occur: Safety/medical concerns Assist level: Moderate assist (Pt 50 - 74%)  Function - Comprehension Comprehension: Auditory Comprehension assist level: Understands basic 50 - 74% of the time/ requires cueing 25 - 49% of the time  Function - Expression Expression: Verbal Expression assist level: Expresses basic 25 - 49% of the time/requires cueing 50 - 75% of the time. Uses single words/gestures.  Function - Social Interaction Social Interaction assist level: Interacts appropriately 90% of the time - Needs monitoring or encouragement for participation or interaction.  Function - Problem Solving Problem solving assist level: Solves basic 50 - 74% of the time/requires cueing 25 - 49% of the time  Function - Memory Memory assist level:  Recognizes or recalls 75 - 89% of the time/requires cueing 10 - 24% of the time Patient normally able to recall (first 3 days only): That he or she is in a hospital  Medical Problem List and Plan: 1.  Right hemiparesis, aphasia, apraxia and cognitive deficits secondary to Left frontal intracranial hypertensive hemorrhage  Continue CIR 2.  DVT Prophylaxis/Anticoagulation: Mechanical: Sequential compression devices, below knee Bilateral lower extremities 3. Pain Management: tylenol prn  4. Mood:  Seems frustrated due to expressive/receptive deficits. Team to provide ego support. LCSW to follow for evaluation and support.   5. Neuropsych: This patient is not capable of making decisions on his own behalf. 6. Skin/Wound Care: routine pressure relief measures 7. Fluids/Electrolytes/Nutrition:  Monitor I/O.   BMP improving on 5/27 8.  CAD s/p CABG: On coreg and crestor 9. Labile BP: Monitor BP. Lisinoprildiscontinued and Coreg dose down titrated.   10. CKD: BUN/Creatinine 32/1.5 at admission and at baseline.     Creatinine 1.08 on 5/27  Will continue to monitor  Renal ultrasound reviewed suggesting medical renal disease disease  Continue to Encourage fluid intake.   11. Depression: Continue zoloft daily, increased on 5/22.   12. Hypothyroid: Resumed supplement.   13. LLL mass: CT chest reviewed from 5/24, suggesting potential malignancy.    Patient being followed by pulmonary and he monitored, who have communicated with the patient's daughter. Workup to be performed as outpatient. 14. Incontinence: Likely due to cognitive and processing issues--UA essentially neg except for some hematuria 15. Urinary retention- on uroxatral  Urine culture negative on 5/21  Renal U/S reviewed suggesting medical renal disease, no obstruction.  Bethanechol 5 mg started 5/23, increased to 10 on 5/24, increased to 25 on 5/25, increased to 50 mg on 5/26. Cont to monitor, Appears to be improving based on  documentation, will start doing bethanechol and monitor for retention, will wean further as tolerated. 16. BPH: Cont Uroxatral, may need to go home with foley if no caregiver can be trained with  ICP, will need OP uro f/u 17. OSA : Continue CPAP  LOS (Days) 13 A FACE TO FACE EVALUATION WAS PERFORMED  Ankit Lorie Phenix 09/08/2015, 7:48 AM   Diarrhea

## 2015-09-08 NOTE — Progress Notes (Signed)
Speech Language Pathology Daily Session Notes  Patient Details  Name: David [name] MRN: [id number] Date of Birth: January 23, 1941  Today's Date: 09/08/2015  Session 1: SLP Individual Time: 1000-1030 SLP Individual Time Calculation (min): 30 min   Session 2: SLP Individual Time: 5379-4327 SLP Individual Time Calculation (min): 30 min  Short Term Goals: Week 2: SLP Short Term Goal 1 (Week 2): Pt will demonstrate >75% acc with Y/N?s with min A multimodal cues. SLP Short Term Goal 2 (Week 2): Pt will demonstrate O x4 with min A verbal and visual cues. SLP Short Term Goal 3 (Week 2): Pt will communicate basic needs/wants with mod A multimodal cues.  SLP Short Term Goal 4 (Week 2): Pt will demonstrate object naming >75% with mod A multimodal cues.  SLP Short Term Goal 5 (Week 2): Pt will demonstrate single word reading comprehension with min A multimodal cues.   Skilled Therapeutic Interventions:  Session 1: Skilled treatment session focused on functional communication. Patient was able to name functional items with Mod-Max semantic and phonemic cues with 100% accuracy and was able to write the object names at the word level with Min A verbal cues and extra time. Patient ambulated to and from his room to the SLP office with supervision. Patient left supine in bed with family present and all needs within reach. Continue with current plan of care.   Session 2: Skilled treatment session focused on functional communication. SLP facilitated session by providing Max A sentence completion, verbal and phonemic cues for word-finding during a sentence completion and written expression task at the word level. Patient with decreased frustration tolerance this session, SLP provided encouragement. Patient ambulated to his room with supervision and left supine in bed with all needs within reach. Continue with current plan of care.   Function:  Cognition Comprehension Comprehension assist level: Understands basic  75 - 89% of the time/ requires cueing 10 - 24% of the time  Expression   Expression assist level: Expresses basic 25 - 49% of the time/requires cueing 50 - 75% of the time. Uses single words/gestures.  Social Interaction Social Interaction assist level: Interacts appropriately 90% of the time - Needs monitoring or encouragement for participation or interaction.  Problem Solving Problem solving assist level: Solves basic 50 - 74% of the time/requires cueing 25 - 49% of the time  Memory Memory assist level: Recognizes or recalls 75 - 89% of the time/requires cueing 10 - 24% of the time    Pain No/Denies Pain   Therapy/Group: Individual Therapy  Miriana Gaertner 09/08/2015, 2:03 PM

## 2015-09-08 NOTE — Progress Notes (Signed)
Physical Therapy Session Note  Patient Details  Name: David [name] MRN: [id number] Date of Birth: 1941-01-14  Today's Date: 09/08/2015 PT Individual Time: 0900-0958 PT Individual Time Calculation (min): 58 min   Short Term Goals: Week 2:  PT Short Term Goal 1 (Week 2): =LTGs  Skilled Therapeutic Interventions/Progress Updates:    Pt received resting in bed, no c/o pain, and agreeable to therapy session.  Session focus on problem solving, balance, activity tolerance, NMR, gait, stair negotiation, and transfers.    Pt transfers sit<>stand, stand/pivot, and ambulatory transfers throughout session with RW and supervision with verbal cues for hand placement and occasional cues for initiation.  Pt ambulates throughout unit, max distance 300', with RW and supervision with cues for upright posture and walker positioning.    PT instructed pt in dynamic standing balance activity x2 trials of 5 minutes each with and without RW reaching in all planes with LUE and RUE for cards to match on back of mirror.    Pt requesting to toilet and ambulates to public restroom on [address] performs toilet transfer and clothing management with supervision>steady assist and hygiene with distant supervision.  Pt unable to problem solve hand washing without total cues for sequencing of water on, soap, wash soap off, and get paper towel.  Pt demos low frustration tolerance for problem solving and frequently states "I can't do it."   PT instructed pt in Nustep x10 minutes for reciprocal stepping pattern, and attention to task with min verbal cues to stop at 10 minutes.  Pt amb back to room at end of session and positioned self in bed with call bell in reach and needs met.    Therapy Documentation Precautions:  Precautions Precautions: Fall Precaution Comments: L ankle edema and pain with AROM and weight bearing Required Braces or Orthoses: Other Brace/Splint Other Brace/Splint: ASO  Restrictions Weight Bearing  Restrictions: No  See Function Navigator for Current Functional Status.   Therapy/Group: Individual Therapy  Earnest Conroy Penven-Crew 09/08/2015, 9:46 AM

## 2015-09-09 ENCOUNTER — Inpatient Hospital Stay (HOSPITAL_COMMUNITY): Payer: Medicare Other | Admitting: Physical Therapy

## 2015-09-09 ENCOUNTER — Telehealth: Payer: Self-pay | Admitting: Hematology

## 2015-09-09 ENCOUNTER — Inpatient Hospital Stay (HOSPITAL_COMMUNITY): Payer: Medicare Other

## 2015-09-09 ENCOUNTER — Other Ambulatory Visit: Payer: Self-pay | Admitting: Hematology

## 2015-09-09 ENCOUNTER — Inpatient Hospital Stay (HOSPITAL_COMMUNITY): Payer: Medicare Other | Admitting: Speech Pathology

## 2015-09-09 DIAGNOSIS — R918 Other nonspecific abnormal finding of lung field: Secondary | ICD-10-CM

## 2015-09-09 MED ORDER — SENNOSIDES-DOCUSATE SODIUM 8.6-50 MG PO TABS: 1.0000 | ORAL_TABLET | Freq: Two times a day (BID) | ORAL | Status: DC

## 2015-09-09 MED ORDER — SELENIUM SULFIDE 1 % EX LOTN: 1.0000 "application " | TOPICAL_LOTION | Freq: Every day | CUTANEOUS | Status: DC

## 2015-09-09 MED ORDER — BETHANECHOL CHLORIDE 5 MG PO TABS: 5.0000 mg | ORAL_TABLET | Freq: Three times a day (TID) | ORAL | Status: DC

## 2015-09-09 MED ORDER — BETHANECHOL CHLORIDE 10 MG PO TABS: 5.0000 mg | ORAL_TABLET | Freq: Three times a day (TID) | ORAL | Status: DC

## 2015-09-09 MED ORDER — SERTRALINE HCL 50 MG PO TABS
125.0000 mg | ORAL_TABLET | Freq: Every day | ORAL | Status: DC
Start: 2015-09-09 — End: 2016-05-06

## 2015-09-09 MED ORDER — ACETAMINOPHEN 325 MG PO TABS: 650.0000 mg | ORAL_TABLET | Freq: Four times a day (QID) | ORAL | Status: DC | PRN

## 2015-09-09 MED ORDER — PANTOPRAZOLE SODIUM 40 MG PO TBEC: 40.0000 mg | DELAYED_RELEASE_TABLET | Freq: Every day | ORAL | Status: DC

## 2015-09-09 MED ORDER — ALFUZOSIN HCL ER 10 MG PO TB24: 10.0000 mg | ORAL_TABLET | Freq: Every day | ORAL | Status: DC

## 2015-09-09 MED ORDER — BISACODYL 10 MG RE SUPP: 10.0000 mg | Freq: Every day | RECTAL | Status: DC | PRN

## 2015-09-09 MED ORDER — BETHANECHOL CHLORIDE 10 MG PO TABS
5.0000 mg | ORAL_TABLET | Freq: Three times a day (TID) | ORAL | Status: DC
  Administered 2015-09-09 (×3): 5 mg via ORAL
  Filled 2015-09-09 (×3): qty 1

## 2015-09-09 NOTE — Progress Notes (Signed)
Oncology brief follow up note  I received a message from Pt's daughter that pt will be discharged soon. Per her request, I will set up his PET scan for the next few weeks, and I will see him after the scan.  David [name]  09/09/2015

## 2015-09-09 NOTE — Progress Notes (Signed)
Physical Therapy Discharge Summary  Patient Details  Name: David [name] MRN: [id number] Date of Birth: May 18, 1940  Today's Date: 09/09/2015 PT Individual Time: 1100-1200 and 1350-1415 PT Individual Time Calculation (min): 60 min and 25 min    Patient has met 12 of 12 long term goals due to improved activity tolerance, improved balance, improved postural control, increased strength and improved awareness.  Patient to discharge at an ambulatory level Supervision.   Patient to d/c to SNF for further rehabilitation in order to maximize safety upon ultimate d/c home.   Recommendation:  Patient will benefit from ongoing skilled PT services in skilled nursing facility setting to continue to advance safe functional mobility, address ongoing impairments in awareness, balance, and activity tolerance, and minimize fall risk.  Equipment: No equipment provided, to be provided at next level of are  Reasons for discharge: treatment goals met  Patient/family agrees with progress made and goals achieved: Yes   Skilled Therapeutic Intervention: Session 1: Pt received resting in w/c and agreeable to therapy session, no c/o pain.  Session focus on transfers, gait, stair negotiation, gait on compliant surfaces, problem solving, communication, and patient education.  Pt transfers from a variety of surfaces with stand/pivot and ambulatory approach with RW and supervision.  Pt ambulates throughout unit, max distance 300' with RW and distant supervision.  PT instructed pt in car transfer at simulated small SUV height with supervision and verbal cues for sequencing of sitting first before swinging LEs into car.  PT instructed pt in ambulation up/down a ramp and on compliant surfaces with distant supervision.  Pt negotiates 12 steps with 2 handrails and supervision with verbal cues for hand placement and sequencing on LEs.  Pt able to path find throughout unit without cues and returned to room at end of session,  positioned self supine in bed with supervision.  Call bell in reach and needs met.   Session 2: Pt received resting in w/c with no c/o pain and agreeable to therapy session.  Pt amb to/from [employer] with supervision.  Pt engaged in cognitive task focus on problem solving and self correct to create a calendar for June with min question cues and increased time.  Pt demos one episode of poor frustration tolerance but easily redirected to task with min cues. Pt able to sustain attention x5 minutes until frustrated and then for 15 minutes following redirection.  Pt returned to room at end of session and left in w/c with call bell in reach and needs met.   PT Discharge Precautions/Restrictions Precautions Precautions: Fall Restrictions Weight Bearing Restrictions: No Pain Pain Assessment Pain Assessment: No/denies pain  Cognition Arousal/Alertness: Awake/alert Orientation Level: Oriented to person;Oriented to place;Oriented to time (min question cues for situation) Sustained Attention: Appears intact Awareness: Impaired Awareness Impairment: Anticipatory impairment Problem Solving: Impaired Problem Solving Impairment: Functional basic;Verbal basic Behaviors: Poor frustration tolerance Safety/Judgment: Appears intact Sensation Sensation Light Touch: Appears Intact Motor  Motor Motor: Within Functional Limits Motor - Discharge Observations: improved strength and mobility   Mobility Transfers Transfers: Yes Sit to Stand: 5: Supervision Stand to Sit: 5: Supervision Locomotion  Ambulation Ambulation: Yes Ambulation/Gait Assistance: 5: Supervision Ambulation Distance (Feet): 300 Feet Assistive device: Rolling walker Ambulation/Gait Assistance Details: cues for relaxed shoulders/upper back Gait Gait: Yes Gait Pattern: Step-to pattern;Decreased stance time - left;Decreased stride length;Decreased weight shift to left;Antalgic Gait velocity: decreased Stairs / Additional  Locomotion Stairs: Yes Stairs Assistance: 5: Supervision Stair Management Technique: Two rails Number of Stairs: 12 Ramp: 5: Supervision  Curb: 5: Psychiatric nurse: No  Trunk/Postural Assessment  Cervical Assessment Cervical Assessment: Within Functional Limits Thoracic Assessment Thoracic Assessment: Within Functional Limits Lumbar Assessment Lumbar Assessment: Within Functional Limits Postural Control Postural Control: Within Functional Limits  Balance Balance Balance Assessed: Yes Static Standing Balance Static Standing - Level of Assistance: 6: Modified independent (Device/Increase time) Dynamic Standing Balance Dynamic Standing - Level of Assistance: 5: Stand by assistance Extremity Assessment      RLE AROM (degrees) RLE Overall AROM Comments: WFL asssessed in sitting RLE Strength Right Hip Flexion: 4/5 Right Knee Flexion: 4+/5 Right Knee Extension: 5/5 Right Ankle Dorsiflexion: 4+/5 Right Ankle Plantar Flexion: 4+/5 LLE AROM (degrees) LLE Overall AROM Comments: WFL assessed in sitting LLE Strength Left Hip Flexion: 4+/5 Left Knee Flexion: 5/5 Left Knee Extension: 5/5 Left Ankle Dorsiflexion: 3/5 (not tested with resistance 2/2 pain) Left Ankle Plantar Flexion: 3/5 (not tested with resistance 2/2 pain)   See Function Navigator for Current Functional Status.  Nanea Jared E Penven-Crew 09/09/2015, 11:44 AM

## 2015-09-09 NOTE — NC FL2 (Signed)
[place] MEDICAID FL2 LEVEL OF CARE SCREENING TOOL     IDENTIFICATION  Patient Name: David [name] Birthdate: 01-08-1941 Sex: male Admission Date (Current Location): 08/26/2015  [facility] and [place] Number:  Engineering geologist and Address:  [facility] [place], [place] [address], [place], [place] [address]      Provider Number:    Attending Physician Name and Address:  Ankit Lorie Phenix, MD  Relative Name and Phone Number:  Silas [place] - daughter - [phone]    Current Level of Care: Hospital Recommended Level of Care: [place] Prior Approval Number:    Date Approved/Denied:   PASRR Number: [id number]  Discharge Plan: SNF    Current Diagnoses: Patient Active Problem List   Diagnosis Date Noted  . Mass of lung   . Mass of lower lobe of left lung   . Pain and swelling of ankle   . CRF (chronic renal failure)   . Urinary retention   . Hypertensive emergency 08/26/2015  . Hyperlipidemia 08/26/2015  . Obesity 08/26/2015  . Lung mass 08/26/2015  . Incontinence 08/26/2015  . Hemorrhagic stroke ([place]) 08/26/2015  . Hemiplegia and hemiparesis following nontraumatic intracerebral hemorrhage affecting right dominant side ([place]) 08/26/2015  . Aphasia following nontraumatic intracerebral hemorrhage 08/26/2015  . Gait disturbance, post-stroke 08/26/2015  . Benign essential HTN   . OSA (obstructive sleep apnea)   . CKD (chronic kidney disease)   . Pacemaker   . ICH (intracerebral hemorrhage) (HCC) - L frontal due to HTN vs CAA 08/22/2015    Orientation RESPIRATION BLADDER Height & Weight     Self, Time, Situation, Place  Normal Continent (with timed toileting, every 4 hours) Weight: 87.1 kg (192 lb 0.3 oz) Height:  '5\' 9"'$  (175.3 cm)  BEHAVIORAL SYMPTOMS/MOOD NEUROLOGICAL BOWEL NUTRITION STATUS      Continent Diet (heart healthy)  AMBULATORY STATUS COMMUNICATION OF NEEDS Skin   Supervision Verbally (has some difficulty with  communicating when he's frustrated) Normal                       Personal Care Assistance Level of Assistance  Bathing, Dressing Bathing Assistance: Limited assistance Feeding assistance: Limited assistance Dressing Assistance: Limited assistance     Functional Limitations Info  Speech     Speech Info: Impaired    SPECIAL CARE FACTORS FREQUENCY  PT (By licensed PT), OT (By licensed OT), Bowel and bladder program, Speech therapy (watch pt for regular urination. monitor for no urination for > 6-8 hours)     PT Frequency: 5x/week OT Frequency: 5x/week Bowel and Bladder Program Frequency: timed toileting every 4 hours   Speech Therapy Frequency: 5x/week - 30 - 45 minutes sessions      Contractures Contractures Info: Not present    Additional Factors Info  Code Status, Allergies Code Status Info: full Allergies Info: NKA           Current Medications (09/09/2015):  This is the current hospital active medication list Current Facility-Administered Medications  Medication Dose Route Frequency Provider Last Rate Last Dose  . acetaminophen (TYLENOL) tablet 650 mg  650 mg Oral Q4H PRN Bary Leriche, PA-C   650 mg at 09/08/15 3474  . alfuzosin (UROXATRAL) 24 hr tablet 10 mg  10 mg Oral Q breakfast Aleksei Plotnikov V, MD   10 mg at 09/09/15 0944  . alum & mag hydroxide-simeth (MAALOX/MYLANTA) 200-200-20 MG/5ML suspension 30 mL  30 mL Oral Q4H PRN Bary Leriche,  PA-C      . bethanechol (URECHOLINE) tablet 5 mg  5 mg Oral TID Bary Leriche, PA-C   5 mg at 09/09/15 1107  . bisacodyl (DULCOLAX) suppository 10 mg  10 mg Rectal Daily PRN Bary Leriche, PA-C   10 mg at 08/31/15 1756  . carvedilol (COREG) tablet 3.125 mg  3.125 mg Oral BID WC Aleksei Plotnikov V, MD   3.125 mg at 09/09/15 0945  . cloNIDine (CATAPRES) tablet 0.1 mg  0.1 mg Oral Q4H PRN Ivan Anchors Love, PA-C      . guaiFENesin-dextromethorphan (ROBITUSSIN DM) 100-10 MG/5ML syrup 5-10 mL  5-10 mL Oral Q6H PRN Bary Leriche,  PA-C      . levothyroxine (SYNTHROID, LEVOTHROID) tablet 125 mcg  125 mcg Oral QAC breakfast Bary Leriche, PA-C   125 mcg at 09/09/15 0263  . lidocaine (XYLOCAINE) 2 % jelly 1 application  1 application Urethral Once Ankit Lorie Phenix, MD      . lidocaine (XYLOCAINE) 2 % jelly 1 application  1 application Urethral PRN Ankit Lorie Phenix, MD      . multivitamin with minerals tablet 1 tablet  1 tablet Oral Daily Bary Leriche, PA-C   1 tablet at 09/09/15 0945  . pantoprazole (PROTONIX) EC tablet 40 mg  40 mg Oral Daily Bary Leriche, PA-C   40 mg at 09/09/15 0946  . prochlorperazine (COMPAZINE) tablet 5-10 mg  5-10 mg Oral Q6H PRN Bary Leriche, PA-C       Or  . prochlorperazine (COMPAZINE) injection 5-10 mg  5-10 mg Intramuscular Q6H PRN Bary Leriche, PA-C       Or  . prochlorperazine (COMPAZINE) suppository 12.5 mg  12.5 mg Rectal Q6H PRN Bary Leriche, PA-C      . rosuvastatin (CRESTOR) tablet 10 mg  10 mg Oral QHS Ivan Anchors Love, PA-C   10 mg at 09/08/15 2006  . selenium sulfide (SELSUN) 1 % shampoo   Topical Daily Pamela S Love, PA-C      . senna-docusate (Senokot-S) tablet 1 tablet  1 tablet Oral BID Bary Leriche, PA-C   1 tablet at 09/09/15 0944  . sertraline (ZOLOFT) tablet 125 mg  125 mg Oral Daily Bary Leriche, PA-C   125 mg at 09/09/15 0945  . sodium phosphate (FLEET) 7-19 GM/118ML enema 1 enema  1 enema Rectal Once PRN Pamela S Love, PA-C      . traZODone (DESYREL) tablet 25-50 mg  25-50 mg Oral QHS PRN Bary Leriche, PA-C         Discharge Medications: Please see discharge summary for a list of discharge medications.  Relevant Imaging Results:  Relevant Lab Results:   Additional Information SSN: [id number]  David [name], [name], LCSW

## 2015-09-09 NOTE — Progress Notes (Signed)
Speech Language Pathology Daily Session Notes  Patient Details  Name: David [name] MRN: [id number] Date of Birth: 12-29-1940  Today's Date: 09/09/2015   Session 1: SLP Individual Time: 0900-0930 SLP Individual Time Calculation (min): 30 min   Session 2: SLP Individual Time: 6712-4580 SLP Individual Time Calculation (min): 30 min  Short Term Goals: Week 2: SLP Short Term Goal 1 (Week 2): Pt will demonstrate >75% acc with Y/N?s with min A multimodal cues. SLP Short Term Goal 2 (Week 2): Pt will demonstrate O x4 with min A verbal and visual cues. SLP Short Term Goal 3 (Week 2): Pt will communicate basic needs/wants with mod A multimodal cues.  SLP Short Term Goal 4 (Week 2): Pt will demonstrate object naming >75% with mod A multimodal cues.  SLP Short Term Goal 5 (Week 2): Pt will demonstrate single word reading comprehension with min A multimodal cues.   Skilled Therapeutic Interventions:  Session 1: Skilled treatment session focused on functional communication goals. Upon arrival, patient was awake while supine in bed. SLP facilitated session by providing Min A verbal cues for problem solving for donning pants and a clean shirt. SLP also facilitated session by providing extra time and Max A multimodal cues for word-finding during a sentence completion task. Patient answered basic yes/no questions with 100% accuracy and complex yes/no questions with 70% accuracy. Patient also follow 1-step commands with 100% accuracy and 2-step commands with 60% accuracy and extra time due to apraxia. Patient left upright in wheelchair with quick release belt in place and all needs within reach. Continue with current plan of care.   Session 2: Skilled treatment session focused on functional communication. SLP facilitated session by providing Mod A sentence completion and phonemic cues for naming functional items with 80% accuracy and Max A multimodal cues to complete responsive naming tasks with 80% accuracy.  Patient completed basic automatic sequences (MOY, DOW, counting 1-10) with extra time and 100% accuracy and completed reading comprehension at the word-sentence level with 100% accuracy and Mod A verbal and question cues. Patient ambulated to and from the SLP office with supervision. Patient left upright in wheelchair with all needs within reach, quick release belt in place and family present. Continue with current plan of care.   Function:   Cognition Comprehension Comprehension assist level: Understands basic 75 - 89% of the time/ requires cueing 10 - 24% of the time  Expression   Expression assist level: Expresses basic 25 - 49% of the time/requires cueing 50 - 75% of the time. Uses single words/gestures.  Social Interaction Social Interaction assist level: Interacts appropriately 90% of the time - Needs monitoring or encouragement for participation or interaction.  Problem Solving Problem solving assist level: Solves basic 50 - 74% of the time/requires cueing 25 - 49% of the time  Memory Memory assist level: Recognizes or recalls 75 - 89% of the time/requires cueing 10 - 24% of the time    Pain Pain Assessment Pain Assessment: No/denies pain  Therapy/Group: Individual Therapy  Sven Pinheiro 09/09/2015, 12:38 PM

## 2015-09-09 NOTE — Plan of Care (Signed)
Problem: RH Laundry Goal: LTG Patient will perform laundry w/assist, cues (OT) LTG: Patient will perform laundry with assistance, with/without cues (OT).  Outcome: Not Met (add Reason) Discharge to SNF

## 2015-09-09 NOTE — Progress Notes (Signed)
Occupational Therapy Discharge Summary  Patient Details  Name: David [name] MRN: [id number] Date of Birth: 11-Jan-1941   Patient has met 8 of 12 long term goals due to improved activity tolerance, improved balance and ability to compensate for deficits.  Pt has made steady progress with BADLs and functional transfers but continues to require assistance with LB bathing and dressing tasks. Pt requires max verbal cues for task initiation and sequencing.  Laundry and simple meal prep goals were not addressed secondary to planned discharge to SNF. Patient to discharge at overall supervision - min A level.    Reasons goals not met: goals not met secondary to safety and pt needing additional assist  Recommendation:  Patient will benefit from ongoing skilled OT services in skilled nursing facility setting to continue to advance functional skills in the area of BADL.  Equipment: No equipment providedDischarge to SNF  Reasons for discharge: discharge from hospital  Patient/family agrees with progress made and goals achieved: Yes  OT Discharge Precautions/Restrictions  Precautions Precautions: Fall Restrictions Weight Bearing Restrictions: No Pain Pain Assessment Pain Assessment: No/denies pain Vision/Perception  Vision- History Baseline Vision/History: Wears glasses Wears Glasses: Reading only Patient Visual Report: No change from baseline Vision- Assessment Vision Assessment?: No apparent visual deficits Eye Alignment: Within Functional Limits Ocular Range of Motion: Within Functional Limits Alignment/Gaze Preference: Within Defined Limits  Cognition Overall Cognitive Status: Impaired/Different from baseline Arousal/Alertness: Awake/alert Orientation Level: Oriented X4 Attention: Sustained Sustained Attention: Appears intact Sustained Attention Impairment: Verbal basic;Functional basic Awareness: Impaired Awareness Impairment: Anticipatory impairment Problem Solving:  Impaired Problem Solving Impairment: Functional basic;Verbal basic Behaviors: Poor frustration tolerance Safety/Judgment: Appears intact Sensation Sensation Light Touch: Appears Intact Stereognosis: Not tested Hot/Cold: Appears Intact Proprioception: Impaired by gross assessment Proprioception Impaired Details: Impaired LLE;Impaired LUE Motor  Motor Motor: Within Functional Limits Motor - Discharge Observations: improved strength and mobility  Mobility  Transfers Sit to Stand: 5: Supervision Stand to Sit: 5: Supervision  Trunk/Postural Assessment  Cervical Assessment Cervical Assessment: Within Functional Limits Thoracic Assessment Thoracic Assessment: Within Functional Limits Lumbar Assessment Lumbar Assessment: Within Functional Limits Postural Control Postural Control: Within Functional Limits  Balance Balance Balance Assessed: Yes Static Standing Balance Static Standing - Level of Assistance: 6: Modified independent (Device/Increase time) Dynamic Standing Balance Dynamic Standing - Level of Assistance: 5: Stand by assistance Extremity/Trunk Assessment RUE Assessment RUE Assessment: Within Functional Limits LUE Assessment LUE Assessment: Within Functional Limits   See Function Navigator for Current Functional Status.  Leotis Shames [facility] 09/09/2015, 1:59 PM

## 2015-09-09 NOTE — Telephone Encounter (Signed)
per pof to sch pt appt-per pof [name] will send [facility] message to sch.I will send one as well-PET [facility]

## 2015-09-09 NOTE — Plan of Care (Signed)
Problem: RH Simple Meal Prep Goal: LTG Patient will perform simple meal prep w/assist (OT) LTG: Patient will perform simple meal prep with assistance, with/without cues (OT).  Outcome: Not Met (add Reason) Discharge to SNF

## 2015-09-09 NOTE — Discharge Summary (Addendum)
Physician Discharge Summary  Patient ID: David [name] MRN: [id number] DOB/AGE: Jun 01, 1940 75 y.o.  Admit date: 08/26/2015 Discharge date: 09/10/2015  Discharge Diagnoses:  Principal Problem:   Hemiplegia and hemiparesis following nontraumatic intracerebral hemorrhage affecting right dominant side (HCC) Active Problems:   ICH (intracerebral hemorrhage) (HCC) - L frontal due to HTN vs CAA   Aphasia following nontraumatic intracerebral hemorrhage   Gait disturbance, post-stroke   CRF (chronic renal failure)   Urinary retention   Pain and swelling of ankle   Mass of lower lobe of left lung   Mass of lung   Discharged Condition:  Stable.   Significant Diagnostic Studies: Dg Ankle 2 Views Left  08/27/2015  CLINICAL DATA:  Left ankle pain and swelling EXAM: LEFT ANKLE - 2 VIEW COMPARISON:  None. FINDINGS: Two views of the left ankle submitted. Ankle mortise is preserved. There is avulsed bony fragment adjacent to distal tibia medial malleolus measures about 8.8 mm. This is of indeterminate age. Appears well corticated and could be from prior injury. Clinical correlation is necessary. IMPRESSION: Ankle mortise is preserved. There is avulsed bony fragment adjacent to distal tibia medial malleolus measures about 8.8 mm. This is of indeterminate age. Appears well corticated and could be from prior injury. Clinical correlation is necessary. Electronically Signed   By: Lahoma Crocker M.D.   On: 08/27/2015 13:43   Ct Head Wo Contrast  08/26/2015  CLINICAL DATA:  Follow-up for intracranial hemorrhage. EXAM: CT HEAD WITHOUT CONTRAST TECHNIQUE: Contiguous axial images were obtained from the base of the skull through the vertex without intravenous contrast. COMPARISON:  08/23/2015 FINDINGS: Skull and Sinuses:Stable.  No interval or acute finding. Visualized orbits: Bilateral cataract resection and scleral band on the left. No acute finding Brain: Parenchymal hematoma in the left frontal lobe shows no interval  enlargement. Maximal dimensions today are 56 x 51 by 31 mm. A rim of vasogenic edema is stable. The hematoma continues to dissect to the left lateral ventricular margin, without intraventricular or subarachnoid extension. Mass effect is similar to previous, midline shift 6-7 mm anteriorly. No signs of acute infarct. No new site of hemorrhage. IMPRESSION: Left frontal hematoma shows no interval enlargement. Similar local mass effect and vasogenic edema. Electronically Signed   By: Monte Fantasia M.D.   On: 08/26/2015 12:49    Ct Chest W Contrast  09/01/2015  CLINICAL DATA:  Left lower lobe mass on previous chest radiography. EXAM: CT CHEST WITH CONTRAST TECHNIQUE: Multidetector CT imaging of the chest was performed during intravenous contrast administration. CONTRAST:  15m ISOVUE-300 IOPAMIDOL (ISOVUE-300) INJECTION 61% COMPARISON:  Radiography 08/23/2015 FINDINGS: Within the left upper lobe in the lingular region, there is a well-circumscribed mass measuring 2.8 x 2.5 x 2.1 cm. This was not described on a previous study from 2000 for as therefore worrisome for well-circumscribed malignancy. This is not spiculated. Does not appear to extend to the pleura. No second lesion identified in the left lung. In the right lung, in the upper lobe posterior laterally, there is a 6 mm well-circumscribed nodule. Otherwise, the lung parenchyma is clear. There is not a background pattern of emphysema. No pleural fluid. No pericardial fluid. The patient has had previous median sternotomy and CABG. Pacemaker is in place. There is atherosclerosis of the aorta but no aneurysm or dissection. No mediastinal or hilar mass or lymphadenopathy. Scans in the upper abdomen do not show any significant finding. No adrenal mass. Ordinary degenerative changes affect the spine. IMPRESSION: 2.8 x 2.5 x 2.1 cm  well-circumscribed mass in the left upper lobe in the lingular region, not described on a CT of 2004. This is likely to represent a  malignancy. 6 mm nodule posterior laterally in the left upper lobe, image 35. This is nonspecific and could be a benign nodule, a second primary or a metastasis. No lymphadenopathy. PET scan may be useful in evaluating these 2 areas. Electronically Signed   By: Nelson Chimes M.D.   On: 09/01/2015 20:09   US Renal  08/29/2015  CLINICAL DATA:  75 year old with left frontal hematoma, also with acute superimposed upon chronic renal insufficiency. EXAM: RENAL / URINARY TRACT ULTRASOUND COMPLETE COMPARISON:  None. FINDINGS: Right Kidney: Length: Approximately 11.7 cm. Well-preserved cortex. Echogenic parenchyma. Approximate 2.3 x 1.9 x 1.6 cm anechoic mass with a thin internal septation demonstrating acoustic enhancement and no internal color Doppler flow involving the mid kidney. Approximate 1.1 x 1.0 x 1.3 cm anechoic mass with acoustic enhancement and no internal color Doppler flow involving the upper pole. No solid renal masses. No hydronephrosis. Left Kidney: Length: Approximately 12.1 cm. Well-preserved cortex. Echogenic parenchyma. Approximate 1.3 x 1.3 x 1.1 cm anechoic mass with acoustic enhancement and no internal color Doppler flow involving the upper pole. Approximate 1.2 x 0.9 x 0.6 cm anechoic mass with acoustic enhancement and no internal color Doppler flow involving the mid kidney. No solid renal masses. No hydronephrosis. Bladder: Decompressed by Foley catheter. IMPRESSION: 1. No evidence of hydronephrosis involving either kidney to suggest urinary tract obstruction as a cause of renal insufficiency. 2. Echogenic parenchyma bilaterally indicating medical renal disease. 3. Cortical cysts involving both kidneys. No significant focal parenchymal abnormality involving either kidney. Electronically Signed   By: Evangeline Dakin M.D.   On: 08/29/2015 15:50    Labs:  Basic Metabolic Panel: BMP Latest Ref Rng 09/04/2015 09/03/2015 09/02/2015  Glucose 65 - 99 mg/dL 100(H) 92 103(H)  BUN 6 - 20 mg/dL 31(H)  36(H) 40(H)  Creatinine 0.61 - 1.24 mg/dL 1.08 1.26(H) 1.30(H)  Sodium 135 - 145 mmol/L 136 137 135  Potassium 3.5 - 5.1 mmol/L 4.4 4.4 4.5  Chloride 101 - 111 mmol/L 104 106 104  CO2 22 - 32 mmol/L '24 22 23  '$ Calcium 8.9 - 10.3 mg/dL 9.0 8.8(L) 9.0     CBC: CBC Latest Ref Rng 09/04/2015 08/29/2015 08/28/2015  WBC 4.0 - 10.5 K/uL 8.6 6.7 7.3  Hemoglobin 13.0 - 17.0 g/dL 12.8(L) 13.0 14.2  Hematocrit 39.0 - 52.0 % 37.5(L) 38.7(L) 41.3  Platelets 150 - 400 K/uL 344 232 240     CBG: No results for input(s): GLUCAP in the last 168 hours.   Brief HPI:   David [name] is a 75 y.o. male with history of HTN, OSA, CKD, PPM who was admitted on 08/22/15 with expressive difficulty. He was found to have left frontal hemorrhage 5.7 cm x 4.5 cm with 6 mm left to right shift. Blood pressure 176/111 at admission and he was started on Cardene drip and transitioned to lisinopril.   Follow up CTA head showed stable large left frontal hematoma without vascular malformations.  Incidental note of LLL mass 3.8 X 3.0 cm noted--CT with contrast recommended once renal status improves.  Dr. Erlinda Hong felt that bleed due to uncontrolled HTN v/s CAA.  Repeat CT head done on 5/18 due to concerns regarding incontinence and showed no changes compared to prior scan on 5/15. Patient with resultant gait disorder, right sided weakness and global aphasia with receptive > expressive deficits. CIR was recommended  for follow up therapy.   Hospital Course: David [name] was admitted to rehab 08/26/2015 for inpatient therapies to consist of PT, ST and OT at least three hours five days a week. Past admission physiatrist, therapy team and rehab RN have worked together to provide customized collaborative inpatient rehab. Blood pressures were monitored bid and have shown good control.  He was found to have left ankle edema with pain at admission and X rays of foot showed ankle mortise preserved with evidence of old injury and ASO was ordered  for support. He was found to have acute on chronic renal failure and was encouraged to push fluids. He continued to worsen with orthostatic changes and arise in BUN/Cr to 65/1.91.  He was hydrated with IVF and lisinopril was discontinued. Renal ultrasound was negative for obstruction and showed medical renal disease. Urine cultures of 5/18 and 5/20 were negative for infection but he was  found to have urinary retention. He was started on uroxatrol as well as urecholine with improvement in voiding function. Currently PVRs are ranging from 0- 50 cc and urecholine has been tapered off.   He is continent of bowel and requires scheduled toileting to help with bladder continence.  Incontinence due to cognitive and processing issues. Po intake has been good and anxiety/frustration levels are decreasing. Zoloft was increased to 125 mg daily. CT chest was done on  5/24 and showed LUL lingular lung mass worrisome for malignancy as well as small posterior LUL mass question benign or second primary. Dr. Nelda Marseille was consulted for work up and expressed concerns of metastasis of melanoma. PET scan to be arranged by office and patient to follow up with Dr. Lamonte Sakai after discharge. Dr. Burr Medico was also consulted and recommended PET scan and if latter negative to work up with biopsy. Respiratory status has been stable and he has been compliant with CPAP use during his stay.  He has made good progress during his rehab stay and is currently at supervision level for mobility, min assist with ADL tasks and requires max multimodal cues for verbal expression due to apraxia. Family has elected on SNF for further therapies and he was discharged to [facility] on 09/10/15.     Rehab course: During patient's stay in rehab weekly team conferences were held to monitor patient's progress, set goals and discuss barriers to discharge. At admission, patient required moderate assist with ADL tasks and mobility. He had moderate global aphasia with  phonemic paraphasias, perseveration and required max assist for problem solving and memory. He has had improvement in activity tolerance, balance, postural control, as well as ability to compensate for deficits. He is has had improvement in functional use RUE  and RLE as well as improved awareness. He is bathing at supervision level and requires max verbal cues with sequencing for initiation of dressing.    He is currently ambulating with supervision.  He requires extra time with max multimodal cues for word finding for sentence completion. He is able to follow 1 step command with 100% accuracy and 2 step commands with 60% accuracy. He is 100% accurate for basic Y/N questions and requires min verbal cues for basic functional tasks.     Disposition:  Skilled Nursing facility.   Diet: Heart Healthy  Special Instructions: 1. Toilet patient every 4 hours when awake. Check post void residuals and resume urecholine if volumes noted to be over 200 cc.  2. Offer fluids frequently during the day.  3. Recheck BMET in 5-7 days  Medication List    STOP taking these medications        ALEVE 220 MG tablet  Generic drug:  naproxen sodium     lisinopril 5 MG tablet  Commonly known as:  PRINIVIL,ZESTRIL      TAKE these medications        acetaminophen 325 MG tablet  Commonly known as:  TYLENOL  Take 2 tablets (650 mg total) by mouth every 6 (six) hours as needed for mild pain.     alfuzosin 10 MG 24 hr tablet  Commonly known as:  UROXATRAL  Take 1 tablet (10 mg total) by mouth daily with breakfast.     bisacodyl 10 MG suppository  Commonly known as:  DULCOLAX  Place 1 suppository (10 mg total) rectally daily as needed for moderate constipation.     carvedilol 3.125 MG tablet  Commonly known as:  COREG  Take 3.125 mg by mouth 2 (two) times daily with a meal.     levothyroxine 125 MCG tablet  Commonly known as:  SYNTHROID, LEVOTHROID  Take 125 mcg by mouth daily before breakfast.      multivitamin with minerals Tabs tablet  Take 1 tablet by mouth daily.     pantoprazole 40 MG tablet  Commonly known as:  PROTONIX  Take 1 tablet (40 mg total) by mouth daily.     PRESCRIPTION MEDICATION  Inhale into the lungs at bedtime. CPAP     rosuvastatin 10 MG tablet  Commonly known as:  CRESTOR  Take 10 mg by mouth at bedtime.     selenium sulfide 1 % Lotn  Commonly known as:  SELSUN  Apply 1 application topically daily. To head and face and rinse off     senna-docusate 8.6-50 MG tablet  Commonly known as:  Senokot-S  Take 1 tablet by mouth 2 (two) times daily.     sertraline 50 MG tablet  Commonly known as:  ZOLOFT  Take 2.5 tablets (125 mg total) by mouth daily.     Turmeric Curcumin 500 MG Caps  Take 500 mg by mouth 2 (two) times daily.        Follow-up Information    Follow up with Ankit Lorie Phenix, MD.   Specialty:  Physical Medicine and Rehabilitation   Why:  office will call you with follow up appointment. ( Address after June 10th--[address]  [place], [place] [address]   Contact information:   [place] [address] [phone]       Follow up with Xu,Jindong, MD On 11/04/2015.   Specialty:  Neurology   Why:  appointment at 8:30 am for follow.    Contact information:   [address] [place] [address] [phone]       Follow up with [name], MD. Call today.   Specialties:  Hematology, Oncology   Why:  for follow up appointment   Contact information:   [place] [address] [phone]       Follow up with Collene Gobble., MD On 10/07/2015.   Specialty:  Pulmonary Disease   Why:  Appt at 10:15 to follow up on pulmonary nodule   Contact information:   [address] [address] [phone]       Follow up with [facility] .   Specialties:  [place], [place]   Contact information:   [place]  [place] [address] [phone]      Signed: Bary Leriche 09/10/2015, 10:03 AM

## 2015-09-09 NOTE — Progress Notes (Signed)
Occupational Therapy Session Note  Patient Details  Name: David [name] MRN: [id number] Date of Birth: September 25, 1940  Today's Date: 09/09/2015 OT Individual Time: 1000-1100 OT Individual Time Calculation (min): 60 min    Short Term Goals: Week 2:  OT Short Term Goal 1 (Week 2): STGs=LTGs secondary to upcoming discharge  Skilled Therapeutic Interventions/Progress Updates:    Pt engaged in BADL retraining including bathing at shower level and dressing with sit<>stand from seat.  Pt required assistance with LB bathing and dressing.  Pt encouraged to attempt tasks but continued to decline.  Pt amb with RW into bathroom and completed all transfers at supervision level.  Pt requested use of toilet and completed all toileting tasks at supervision level. Pt required max verbal cues for task initiation and sequencing during bathing tasks but initiated all dressing tasks when presented with clothing.  Pt stood at sink to complete grooming tasks.  Pt returned to w/c with QRB in place and all needs within reach.  Therapy Documentation Precautions:  Precautions Precautions: Fall Precaution Comments: L ankle edema and pain with AROM and weight bearing Required Braces or Orthoses: Other Brace/Splint Other Brace/Splint: ASO  Restrictions Weight Bearing Restrictions: No Pain:  Pt c/o generalized pain (unrated); emotional support, RN aware  See Function Navigator for Current Functional Status.   Therapy/Group: Individual Therapy  Leroy Libman 09/09/2015, 11:03 AM

## 2015-09-09 NOTE — Progress Notes (Signed)
Subjective/Complaints: Patient seen lying in bed this morning. He appears to continue to be doing well.    Review systems limited by aphasia and cognition, but appears to deny CP, SOB, N/V/D.  Objective: Vital Signs: Blood pressure 117/75, pulse 71, temperature 98.2 F (36.8 C), temperature source Oral, resp. rate 18, height '5\' 9"'$  (1.753 m), weight 87.1 kg (192 lb 0.3 oz), SpO2 96 %. No results found. No results found for this or any previous visit (from the past 72 hour(s)).  Gen. no acute distress. Vital signs reviewed. Well-developed, well-nourished. HENT: Normocephalic atraumatic Eyes: Conjunctivae and EOM normal Cardio: RRR and No murmur Resp: CTA B/L and unlabored GI: BS positive and nondistended nontender Musc/Skel: No edema, no tenderness noted. Neuro:  Global aphasia, Expressive > receptive Alert and oriented x3 with increased time and options Able to perform demonstrated commands for the most part Motor: 4+/5 in the right deltoid, biceps, triceps, grip, hip flexor, knee extensor, ankle dorsiflex and plantar flexor.  5/5 on the left side in the same muscle groups., No ataxia. Skin:   Intact. Warm and dry.  Assessment/Plan: 1. Functional deficits secondary to Left frontal intracranial hypertensive hemorrhage  which require 3+ hours per day of interdisciplinary therapy in a comprehensive inpatient rehab setting. Physiatrist is providing close team supervision and 24 hour management of active medical problems listed below. Physiatrist and rehab team continue to assess barriers to discharge/monitor patient progress toward functional and medical goals. FIM: Function - Bathing Bathing activity did not occur: Refused Position: Shower Body parts bathed by patient: Right arm, Left arm, Abdomen, Chest, Front perineal area, Buttocks, Right upper leg, Left upper leg, Back Body parts bathed by helper: Left lower leg, Right lower leg Bathing not applicable: Back Assist Level:  Touching or steadying assistance(Pt > 75%)  Function- Upper Body Dressing/Undressing Upper body dressing/undressing activity did not occur: Refused What is the patient wearing?: Pull over shirt/dress Pull over shirt/dress - Perfomed by patient: Pull shirt over trunk Pull over shirt/dress - Perfomed by helper: Put head through opening, Pull shirt over trunk Assist Level: Set up, Supervision or verbal cues Set up : To obtain clothing/put away Function - Lower Body Dressing/Undressing What is the patient wearing?: Pants Position: Wheelchair/chair at sink Underwear - Performed by patient: Thread/unthread right underwear leg, Pull underwear up/down Underwear - Performed by helper: Thread/unthread left underwear leg Pants- Performed by patient: Thread/unthread right pants leg, Thread/unthread left pants leg, Pull pants up/down Pants- Performed by helper: Pull pants up/down Non-skid slipper socks- Performed by patient: Don/doff right sock, Don/doff left sock (able to don) Non-skid slipper socks- Performed by helper: Don/doff left sock, Don/doff right sock Assist for footwear: Maximal assist Assist for lower body dressing: Touching or steadying assistance (Pt > 75%)  Function - Toileting Toileting activity did not occur: No continent bowel/bladder event Toileting steps completed by patient: Adjust clothing prior to toileting, Performs perineal hygiene, Adjust clothing after toileting Toileting steps completed by helper: Adjust clothing after toileting Toileting Assistive Devices: Grab bar or rail Assist level: Touching or steadying assistance (Pt.75%)  Function - Air cabin crew transfer activity did not occur: Refused Toilet transfer assistive device: Grab bar, Elevated toilet seat/BSC over toilet Assist level to toilet: Touching or steadying assistance (Pt > 75%) Assist level from toilet: Touching or steadying assistance (Pt > 75%) Assist level to bedside commode (at bedside):  Moderate assist (Pt 50 - 74%/lift or lower) Assist level from bedside commode (at bedside): Moderate assist (Pt 50 - 74%/lift or lower)  Function - Chair/bed transfer Chair/bed transfer method: Stand pivot Chair/bed transfer assist level: Supervision or verbal cues Chair/bed transfer assistive device: Walker, Armrests Chair/bed transfer details: Verbal cues for precautions/safety  Function - Locomotion: Wheelchair Will patient use wheelchair at discharge?: No Type: Manual Max wheelchair distance: 100 Assist Level: Supervision or verbal cues Assist Level: Supervision or verbal cues Wheel 150 feet activity did not occur: Safety/medical concerns Assist Level: Supervision or verbal cues Turns around,maneuvers to table,bed, and toilet,negotiates 3% grade,maneuvers on rugs and over doorsills: No Function - Locomotion: Ambulation Assistive device: Walker-rolling Max distance: 200 Assist level: Supervision or verbal cues Assist level: Supervision or verbal cues Assist level: Supervision or verbal cues Walk 150 feet activity did not occur: Safety/medical concerns Assist level: Supervision or verbal cues Walk 10 feet on uneven surfaces activity did not occur: Safety/medical concerns Assist level: Supervision or verbal cues  Function - Comprehension Comprehension: Auditory Comprehension assist level: Understands basic 75 - 89% of the time/ requires cueing 10 - 24% of the time  Function - Expression Expression: Verbal Expression assist level: Expresses basic 25 - 49% of the time/requires cueing 50 - 75% of the time. Uses single words/gestures.  Function - Social Interaction Social Interaction assist level: Interacts appropriately 90% of the time - Needs monitoring or encouragement for participation or interaction.  Function - Problem Solving Problem solving assist level: Solves basic 50 - 74% of the time/requires cueing 25 - 49% of the time  Function - Memory Memory assist level:  Recognizes or recalls 75 - 89% of the time/requires cueing 10 - 24% of the time Patient normally able to recall (first 3 days only): That he or she is in a hospital  Medical Problem List and Plan: 1.  Right hemiparesis, aphasia, apraxia and cognitive deficits secondary to Left frontal intracranial hypertensive hemorrhage  Continue CIR, plan for d/c tomorrow 2.  DVT Prophylaxis/Anticoagulation: Mechanical: Sequential compression devices, below knee Bilateral lower extremities 3. Pain Management: tylenol prn  4. Mood:  Seems frustrated due to expressive/receptive deficits. Team to provide ego support. LCSW to follow for evaluation and support.   5. Neuropsych: This patient is not capable of making decisions on his own behalf. 6. Skin/Wound Care: routine pressure relief measures 7. Fluids/Electrolytes/Nutrition:  Monitor I/O.   BMP improving on 5/27 8.  CAD s/p CABG: On coreg and crestor 9. Labile BP: Monitor BP. Lisinoprildiscontinued and Coreg dose down titrated.   10. CKD: BUN/Creatinine 32/1.5 at admission and at baseline.     Creatinine 1.08 on 5/27  Will continue to monitor  Renal ultrasound reviewed suggesting medical renal disease disease  Continue to Encourage fluid intake.   11. Depression: Continue zoloft daily, increased on 5/22.   12. Hypothyroid: Resumed supplement.   13. LLL mass: CT chest reviewed from 5/24, suggesting potential malignancy.    Patient being followed by pulmonary and he monitored, who have communicated with the patient's daughter. Workup to be performed as outpatient. 14. Incontinence: Likely due to cognitive and processing issues--UA essentially neg except for some hematuria 15. Urinary retention- on uroxatral  Urine culture negative on 5/21  Renal U/S reviewed suggesting medical renal disease, no obstruction.  Bethanechol 5 mg started 5/23, increased to 10 on 5/24, increased to 25 on 5/25, increased to 50 mg on 5/26. Cont to monitor, Appears to be improving  based on documentation, will cont to wean bethanechol and monitor for retention, will plan to d/c tomorrow. 16. BPH: Cont Uroxatral, may need to go home with foley  if no caregiver can be trained with ICP, will need OP uro f/u 17. OSA : Continue CPAP  LOS (Days) 14 A FACE TO FACE EVALUATION WAS PERFORMED  Ankit Lorie Phenix 09/09/2015, 8:15 AM   Diarrhea

## 2015-09-10 ENCOUNTER — Other Ambulatory Visit: Payer: Self-pay | Admitting: Emergency Medicine

## 2015-09-10 ENCOUNTER — Inpatient Hospital Stay (HOSPITAL_COMMUNITY): Payer: Medicare Other | Admitting: Speech Pathology

## 2015-09-10 NOTE — Progress Notes (Signed)
Social Work Discharge Note  The overall goal for the admission was met for:   Discharge location: No - Pt to SNF at [facility] of Stay: Yes - 15 days  Discharge activity level: Yes - supervision  Home/community participation: No - pt's cognition impacts his ability to return home   Services provided included: MD, RD, PT, OT, SLP, RN, Pharmacy and SW  Financial Services: Medicare  Follow-up services arranged: Other: [facility] SNF  Comments (or additional information): Pt to transfer to SNF for more rehab.  Pt's family is also working on the plan for pt and his wife after he leaves SNF at [facility].  Dtr is working on caregivers at home and handrails for entry to home.    Patient/Family verbalized understanding of follow-up arrangements: Yes  Individual responsible for coordination of the follow-up plan: pt's dtr, Dr. Silas [place]  Confirmed correct DME delivered: Alwin Lanigan, [name] 09/10/2015    Imajean Mcdermid, [name]

## 2015-09-10 NOTE — Plan of Care (Signed)
Problem: RH SAFETY Goal: RH STG ADHERE TO SAFETY PRECAUTIONS W/ASSISTANCE/DEVICE STG Adhere to Safety Precautions With mod Assistance/Device.  Outcome: Completed/Met Date Met:  09/10/15 Pt supervision w/RW     

## 2015-09-10 NOTE — Progress Notes (Signed)
Pt discharged to [facility] in [place]. Report called to receiving facility nurse, Precious Bard. Precious Bard also questioning CPAP setting so that they could provide the pt with a CPAP upon arrival. Pt and wife unaware of his home CPAP setting. Spoke to [place]. Mindy stated that our CPAP's settings automatically tritiated to pt's needs. However, Mindy did indicate that we could provide the nursing home with the following information. EPAT of 8, and pt's was on Room Air. Information related to [place]. Pt escorted off unit in w/c with personal belonging by [place], [place]. Pt transferred via private transport to receiving facility, by pt's wife and daughter.

## 2015-09-10 NOTE — Progress Notes (Signed)
Social Work Patient ID: David [name], male   DOB: Dec 04, 1940, 75 y.o.   MRN: [id number]   Lynnda Child, LCSW Social Worker Signed  Patient Care Conference 09/10/2015 11:10 AM    Expand All Collapse All   Inpatient RehabilitationTeam Conference and Plan of Care Update Date: 09/08/2015   Time: 2:00 PM     Patient Name: David [name]       Medical Record Number: [id number]  Date of Birth: 08-27-1940 Sex: Male         Room/Bed: 4W11C/4W11C-01 Payor Info: Payor: MEDICARE / Plan: MEDICARE PART A AND B / Product Type: *No Product type* /    Admitting Diagnosis: LT FRONTAL HEMMORRHAGE   Admit Date/Time:  08/26/2015  4:42 PM Admission Comments: No comment available   Primary Diagnosis:  Hemiplegia and hemiparesis following nontraumatic intracerebral hemorrhage affecting right dominant side (HCC) Principal Problem: Hemiplegia and hemiparesis following nontraumatic intracerebral hemorrhage affecting right dominant side [facility])    Patient Active Problem List     Diagnosis  Date Noted   .  Mass of lung     .  Mass of lower lobe of left lung     .  Pain and swelling of ankle     .  CRF (chronic renal failure)     .  Urinary retention     .  Hypertensive emergency  08/26/2015   .  Hyperlipidemia  08/26/2015   .  Obesity  08/26/2015   .  Lung mass  08/26/2015   .  Incontinence  08/26/2015   .  Hemorrhagic stroke ([place])  08/26/2015   .  Hemiplegia and hemiparesis following nontraumatic intracerebral hemorrhage affecting right dominant side ([place])  08/26/2015   .  Aphasia following nontraumatic intracerebral hemorrhage  08/26/2015   .  Gait disturbance, post-stroke  08/26/2015   .  Benign essential HTN     .  OSA (obstructive sleep apnea)     .  CKD (chronic kidney disease)     .  Pacemaker     .  ICH (intracerebral hemorrhage) ([place]) - L frontal due to HTN vs CAA  08/22/2015     Expected Discharge Date: Expected Discharge Date: 09/10/15  Team Members Present: Physician leading  conference: Dr. Delice Lesch Social Worker Present: Alfonse Alpers, LCSW Nurse Present: Junius Creamer, RN PT Present: Dwyane Dee, PT;Austin Berline Lopes, PT;Victoria Miller, Dustin Folks, PT OT Present: Benay Pillow, OT SLP Present: Weston Anna, SLP PPS Coordinator present : Daiva Nakayama, RN, CRRN        Current Status/Progress  Goal  Weekly Team Focus   Medical     Right hemiparesis, aphasia, apraxia and cognitive deficits secondary to Left frontal intracranial hypertensive hemorrhage with urinary retention  Improve cognition, mobility, urinary retention   see above   Bowel/Bladder     continent of bowel and bladder. uses urinal to void.   remain continent of bowel and bladder.   monitor for no void >6-8 hours   Swallow/Nutrition/ Hydration     Regular textures with thin liquids, Mod I for use of swallow strategies   Mod I  Goals Met   ADL's     supervision with RW for self care tasks, with steady assist for LB clothing management  supervision overall  self care retraining, d/c planning, activity tolerance    Mobility     supervision overall for mobility, min assist on stairs and compliant surfaces  supervision overall  progressive cognitive challenges during mobility tasks  Communication     Max A for functional communiction, Min A for comprehension of basic information   Max A for functional communication, Min A for comprehension of basic information   comprehension of basic commands, functional communication    Safety/Cognition/ Behavioral Observations    Supervison  Supervision   awareness   Pain     denies pain.  pain less than 2  monitor for pain and medicate prn.    Skin     No skin breakdown.  monitor skin q shift prn  No new skin breakdown.    Rehab Goals Patient on target to meet rehab goals: Yes Rehab Goals Revised: none *See Care Plan and progress notes for long and short-term goals.    Barriers to Discharge:  safety, mobility, transfers, urinary  retention      Possible Resolutions to Barriers:   follow labs, pt and family edu, may need SNF, weaning urinary meds as function improving     Discharge Planning/Teaching Needs:   Family has decided that pt going to SNF at [facility] is the best option at this time.  Family will then prepare for pt's return to their apt by having handrails installed and hiring caregivers, as needed.   none - Pt to SNF.  Family has been present for therapies.    Team Discussion:    Pt's labs are stable and urinary retention is better and Dr. Posey Pronto is weaning pt's medications.  Pt should be on track for SNF transfer on 09-10-15.  PT reported that pt is supervision for transfers and gait.  His challenges are with problem solving and poor tolerance for frustration.  OT stated that pt did better with cueing today.  He fluctuates, but overall is supervision with rolling walker.  ST shared that pt has trouble recognizing/naming common objects due to aphasia and ataxia.  Pt's reading is intact and writing is functional.  Pt has anxiety and frustration when he can't figure things out, so he does better with shorter, daily SLP sessions.   Revisions to Treatment Plan:    none    Continued Need for Acute Rehabilitation Level of Care: The patient requires daily medical management by a physician with specialized training in physical medicine and rehabilitation for the following conditions: Daily direction of a multidisciplinary physical rehabilitation program to ensure safe treatment while eliciting the highest outcome that is of practical value to the patient.: Yes Daily medical management of patient stability for increased activity during participation in an intensive rehabilitation regime.: Yes Daily analysis of laboratory values and/or radiology reports with any subsequent need for medication adjustment of medical intervention for : Neurological problems;Urological problems  Rawan Riendeau, [name] 09/10/2015, 11:10 AM

## 2015-09-10 NOTE — Progress Notes (Addendum)
Subjective/Complaints: Patient seen lying in bed this morning. He states he is doing well.  Review systems limited by aphasia and cognition, but appears to deny CP, SOB, N/V/D.  Objective: Vital Signs: Blood pressure 122/76, pulse 73, temperature 98.5 F (36.9 C), temperature source Oral, resp. rate 18, height '5\' 9"'$  (1.753 m), weight 87.1 kg (192 lb 0.3 oz), SpO2 97 %. No results found. No results found for this or any previous visit (from the past 72 hour(s)).  Gen. no acute distress. Vital signs reviewed. Well-developed, well-nourished. HENT: Normocephalic atraumatic Eyes: Conjunctivae and EOM normal Cardio: RRR and No murmur Resp: CTA B/L and unlabored GI: BS positive and nondistended nontender Musc/Skel: No edema, no tenderness noted. Neuro:  Global aphasia, Expressive > receptive, with repitition Alert and oriented x3 with increased time and options Able to perform demonstrated commands for the most part Motor: 4+/5 in the right deltoid, biceps, triceps, grip, hip flexor, knee extensor, ankle dorsiflex and plantar flexor.  5/5 on the left side in the same muscle groups., No ataxia. Skin:   Intact. Warm and dry.  Assessment/Plan: 1. Functional deficits secondary to Left frontal intracranial hypertensive hemorrhage  which require 3+ hours per day of interdisciplinary therapy in a comprehensive inpatient rehab setting. Physiatrist is providing close team supervision and 24 hour management of active medical problems listed below. Physiatrist and rehab team continue to assess barriers to discharge/monitor patient progress toward functional and medical goals. FIM: Function - Bathing Bathing activity did not occur: Refused Position: Shower Body parts bathed by patient: Right arm, Left arm, Abdomen, Chest, Front perineal area, Buttocks, Right upper leg, Left upper leg, Back Body parts bathed by helper: Right lower leg, Left lower leg Bathing not applicable: Back Assist Level:  Touching or steadying assistance(Pt > 75%)  Function- Upper Body Dressing/Undressing Upper body dressing/undressing activity did not occur: Refused What is the patient wearing?: Pull over shirt/dress Pull over shirt/dress - Perfomed by patient: Thread/unthread right sleeve, Thread/unthread left sleeve, Put head through opening, Pull shirt over trunk Pull over shirt/dress - Perfomed by helper: Put head through opening, Pull shirt over trunk Assist Level: Set up, Supervision or verbal cues Set up : To obtain clothing/put away Function - Lower Body Dressing/Undressing What is the patient wearing?: Pants, Non-skid slipper socks, AFO Position: Wheelchair/chair at sink Underwear - Performed by patient: Thread/unthread right underwear leg, Pull underwear up/down Underwear - Performed by helper: Thread/unthread left underwear leg Pants- Performed by patient: Pull pants up/down, Thread/unthread left pants leg Pants- Performed by helper: Thread/unthread right pants leg Non-skid slipper socks- Performed by patient: Don/doff right sock, Don/doff left sock (able to don) Non-skid slipper socks- Performed by helper: Don/doff right sock, Don/doff left sock Assist for footwear: Dependant Assist for lower body dressing: Touching or steadying assistance (Pt > 75%)  Function - Toileting Toileting activity did not occur: No continent bowel/bladder event Toileting steps completed by patient: Adjust clothing prior to toileting, Performs perineal hygiene, Adjust clothing after toileting Toileting steps completed by helper: Adjust clothing after toileting Toileting Assistive Devices: Grab bar or rail Assist level: Supervision or verbal cues  Function - Air cabin crew transfer activity did not occur: Risk analyst transfer assistive device: Pension scheme manager level to toilet: Supervision or verbal cues Assist level from toilet: Supervision or verbal cues Assist level to bedside commode (at bedside):  Moderate assist (Pt 50 - 74%/lift or lower) Assist level from bedside commode (at bedside): Moderate assist (Pt 50 - 74%/lift or lower)  Function - Chair/bed transfer  Chair/bed transfer method: Ambulatory Chair/bed transfer assist level: Supervision or verbal cues Chair/bed transfer assistive device: Armrests, Walker Chair/bed transfer details: Verbal cues for precautions/safety  Function - Locomotion: Wheelchair Will patient use wheelchair at discharge?: No Type: Manual Wheelchair activity did not occur: N/A (pt ambulatory on unit) Max wheelchair distance: 100 Assist Level: Supervision or verbal cues Assist Level: Supervision or verbal cues Wheel 150 feet activity did not occur: Safety/medical concerns Assist Level: Supervision or verbal cues Turns around,maneuvers to table,bed, and toilet,negotiates 3% grade,maneuvers on rugs and over doorsills: No Function - Locomotion: Ambulation Assistive device: Walker-rolling Max distance: 300 Assist level: Supervision or verbal cues Assist level: Supervision or verbal cues Assist level: Supervision or verbal cues Walk 150 feet activity did not occur: Safety/medical concerns Assist level: Supervision or verbal cues Walk 10 feet on uneven surfaces activity did not occur: Safety/medical concerns Assist level: Supervision or verbal cues  Function - Comprehension Comprehension: Auditory Comprehension assist level: Understands basic 75 - 89% of the time/ requires cueing 10 - 24% of the time  Function - Expression Expression: Verbal Expression assist level: Expresses basic 25 - 49% of the time/requires cueing 50 - 75% of the time. Uses single words/gestures.  Function - Social Interaction Social Interaction assist level: Interacts appropriately 90% of the time - Needs monitoring or encouragement for participation or interaction.  Function - Problem Solving Problem solving assist level: Solves basic 50 - 74% of the time/requires cueing 25 -  49% of the time  Function - Memory Memory assistive device: Memory book Memory assist level: Recognizes or recalls 50 - 74% of the time/requires cueing 25 - 49% of the time Patient normally able to recall (first 3 days only): That he or she is in a hospital  Medical Problem List and Plan: 1.  Right hemiparesis, aphasia, apraxia and cognitive deficits secondary to Left frontal intracranial hypertensive hemorrhage  D/c today  Will see pt for transitional care management in 1-2 weeks 2.  DVT Prophylaxis/Anticoagulation: Mechanical: Sequential compression devices, below knee Bilateral lower extremities 3. Pain Management: tylenol prn  4. Mood:  Seems frustrated due to expressive/receptive deficits. Team to provide ego support. LCSW to follow for evaluation and support.   5. Neuropsych: This patient is not capable of making decisions on his own behalf. 6. Skin/Wound Care: routine pressure relief measures 7. Fluids/Electrolytes/Nutrition:  Monitor I/O.   BMP improving on 5/27 8.  CAD s/p CABG: On coreg and crestor 9. Labile BP: Monitor BP. Lisinoprildiscontinued and Coreg dose down titrated.   10. CKD: BUN/Creatinine 32/1.5 at admission and at baseline.     Creatinine 1.08 on 5/27  Will continue to monitor  Renal ultrasound reviewed suggesting medical renal disease disease  Continue to Encourage fluid intake.   11. Depression: Continue zoloft daily, increased on 5/22.   12. Hypothyroid: Resumed supplement.   13. LLL mass: CT chest reviewed from 5/24, suggesting potential malignancy.    Patient being followed by pulmonary and he monitored, who have communicated with the patient's daughter. Workup to be performed as outpatient.  Appreciate Onc recs, plan PET as outpt 14. Incontinence: Likely due to cognitive and processing issues--UA essentially neg except for some hematuria 15. Urinary retention- on uroxatral  Urine culture negative on 5/21  Renal U/S reviewed suggesting medical renal  disease, no obstruction.  Bethanechol 5 mg started 5/23, increased to 10 on 5/24, increased to 25 on 5/25, increased to 50 mg on 5/26. Cont to monitor, Appears to be improving based on documentation,  will cont to wean bethanechol and monitor for retention, slight retention, but improving.  Due to possibility of side effects and limiting polypharmacy, will d/c today and have pt follow as outpt.  May restart if necessary. 16. BPH: Cont Uroxatral, may need to go home with foley if no caregiver can be trained with ICP, will need OP uro f/u 17. OSA : Continue CPAP  LOS (Days) 15 A FACE TO FACE EVALUATION WAS PERFORMED  David [name] 09/10/2015, 8:46 AM   Diarrhea

## 2015-09-10 NOTE — Discharge Instructions (Signed)
Inpatient Rehab Discharge Instructions  David [name] Discharge date and time:  09/10/15  Activities/Precautions/ Functional Status: Activity: no lifting, driving, or strenuous exercise till cleared by MD Diet: cardiac diet Wound Care: none needed   Functional status:  ___ No restrictions     ___ Walk up steps independently _X__ 24/7 supervision/assistance   ___ Walk up steps with assistance ___ Intermittent supervision/assistance  ___ Bathe/dress independently ___ Walk with walker     _X__ Bathe/dress with assistance ___ Walk Independently    ___ Shower independently ___ Walk with assistance    ___ Shower with assistance _X__ No alcohol     ___ Return to work/school ________   Special Instructions:    STROKE/TIA DISCHARGE INSTRUCTIONS SMOKING Cigarette smoking nearly doubles your risk of having a stroke & is the single most alterable risk factor  If you smoke or have smoked in the last 12 months, you are advised to quit smoking for your health.  Most of the excess cardiovascular risk related to smoking disappears within a year of stopping.  Ask you doctor about anti-smoking medications  [place] Quit Line: 1-800-QUIT NOW  Free Smoking Cessation Classes [phone]  CHOLESTEROL Know your levels; limit fat & cholesterol in your diet  Lipid Panel     Component Value Date/Time   CHOL 215* 08/24/2015 0336   TRIG 196* 08/24/2015 0336   HDL 41 08/24/2015 0336   CHOLHDL 5.2 08/24/2015 0336   VLDL 39 08/24/2015 0336   LDLCALC 135* 08/24/2015 0336      Many patients benefit from treatment even if their cholesterol is at goal.  Goal: Total Cholesterol (CHOL) less than 160  Goal:  Triglycerides (TRIG) less than 150  Goal:  HDL greater than 40  Goal:  LDL (LDLCALC) less than 100   BLOOD PRESSURE American Stroke Association blood pressure target is less that 120/80 mm/Hg  Your discharge blood pressure is:  BP: 128/81 mmHg  Monitor your blood pressure  Limit your salt and  alcohol intake  Many individuals will require more than one medication for high blood pressure  DIABETES (A1c is a blood sugar average for last 3 months) Goal HGBA1c is under 7% (HBGA1c is blood sugar average for last 3 months)  Diabetes: No known diagnosis of diabetes    Lab Results  Component Value Date   HGBA1C 6.1* 08/23/2015     Your HGBA1c can be lowered with medications, healthy diet, and exercise.  Check your blood sugar as directed by your physician  Call your physician if you experience unexplained or low blood sugars.  PHYSICAL ACTIVITY/REHABILITATION Goal is 30 minutes at least 4 days per week  Activity: No driving, Therapies: See above Return to work: N/A  Activity decreases your risk of heart attack and stroke and makes your heart stronger.  It helps control your weight and blood pressure; helps you relax and can improve your mood.  Participate in a regular exercise program.  Talk with your doctor about the best form of exercise for you (dancing, walking, swimming, cycling).  DIET/WEIGHT Goal is to maintain a healthy weight  Your discharge diet is: Diet Heart Room service appropriate?: Yes; Fluid consistency:: Thin  liquids Your height is:  Height: '5\' 9"'$  (175.3 cm) Your current weight is: Weight: 91.672 kg (202 lb 1.6 oz) Your Body Mass Index (BMI) is:     Following the type of diet specifically designed for you will help prevent another stroke.  Your goal weight is:   Your goal Body  Mass Index (BMI) is 19-24.  Healthy food habits can help reduce 3 risk factors for stroke:  High cholesterol, hypertension, and excess weight.  RESOURCES Stroke/Support Group:  Call [phone]   STROKE EDUCATION PROVIDED/REVIEWED AND GIVEN TO PATIENT Stroke warning signs and symptoms How to activate emergency medical system (call 911). Medications prescribed at discharge. Need for follow-up after discharge. Personal risk factors for stroke. Pneumonia vaccine given:  Flu  vaccine given:  My questions have been answered, the writing is legible, and I understand these instructions.  I will adhere to these goals & educational materials that have been provided to me after my discharge from the hospital.      My questions have been answered and I understand these instructions. I will adhere to these goals and the provided educational materials after my discharge from the hospital.  Patient/Caregiver Signature _______________________________ Date __________  Clinician Signature _______________________________________ Date __________  Please bring this form and your medication list with you to all your follow-up doctor's appointments.

## 2015-09-10 NOTE — Progress Notes (Signed)
Social Work Patient ID: [name], male   DOB: 09-21-1940, 75 y.o.   MRN: [id number]   CSW met with pt and his wife with pt's dtr, Dr. Silas [place], on speaker phone to update them on team conference discussion and to talk about pt's d/c plan.  Pt's wife and pt both feel he will need more rehab before trying to navigate at home and dtr agrees.  Pt and wife both have difficulty problem solving and they will likely need help when pt does return home.  In the meantime, pt will go to SNF at [facility] on 09-10-15 and dtr will transport him in her private vehicle.  Facility will be ready to accept him when she arrives around 3:30 PM.  CSW to send orders ahead of pt for them to prepare for him.  CSW will continue to follow and assist as needed.

## 2015-09-10 NOTE — Progress Notes (Signed)
Speech Language Pathology Session Note & Discharge Summary  Patient Details  Name: David [name] MRN: [id number] Date of Birth: 08/07/1940  Today's Date: 09/10/2015 SLP Individual Time: 1330-1400 SLP Individual Time Calculation (min): 30 min   Skilled Therapeutic Interventions:  Skilled treatment session focused on functional communication. SLP facilitated session by providing Mod A semantic and phonemic cues for word-finding during a sentence completion task. Patient also followed basic commands within functional tasks with Mod I. Patient left in wheelchair with all needs within reach.   Patient has met 5 of 5 long term goals.  Patient to discharge at [facility] Max;Mod level.   Reasons goals not met: N/A   Clinical Impression/Discharge Summary: Patient has made functional gains and has met 5 of 5 LTG's this admission due to improved orientation, swallowing and cognitive function. Currently, patient is consuming regular textures with thin liquids without overt s/s of aspiration and is Mod I for use of swallowing compensatory strategies. Patient can follow basic commands with extra time and Min A cues and can express his basic wants/needs with Mod-Max A multimodal communication. Education is complete and patient's family is unable to provide the necessary assistance needed at this time, therefore, he will discharge to a SNF. Patient would benefit from f/u SLP services to maximize his functional communication and overall functional independence.   Care Partner:  Caregiver Able to Provide Assistance: No  Type of Caregiver Assistance: Physical;Cognitive  Recommendation:  Outpatient SLP;24 hour supervision/assistance  Rationale for SLP Follow Up: Maximize functional communication;Maximize cognitive function and independence;Reduce caregiver burden   Equipment: N/A   Reasons for discharge: Discharged from hospital   Patient/Family Agrees with Progress Made and Goals Achieved: Yes    Function:   Cognition Comprehension Comprehension assist level: Understands basic 75 - 89% of the time/ requires cueing 10 - 24% of the time  Expression   Expression assist level: Expresses basic 25 - 49% of the time/requires cueing 50 - 75% of the time. Uses single words/gestures.  Social Interaction Social Interaction assist level: Interacts appropriately 90% of the time - Needs monitoring or encouragement for participation or interaction.  Problem Solving Problem solving assist level: Solves basic 50 - 74% of the time/requires cueing 25 - 49% of the time  Memory Memory assist level: Recognizes or recalls 50 - 74% of the time/requires cueing 25 - 49% of the time   David [name] 09/10/2015, 5:10 PM

## 2015-09-10 NOTE — Patient Care Conference (Signed)
Inpatient RehabilitationTeam Conference and Plan of Care Update Date: 09/08/2015   Time: 2:00 PM    Patient Name: David [name]      Medical Record Number: [id number]  Date of Birth: 1940-06-29 Sex: Male         Room/Bed: 4W11C/4W11C-01 Payor Info: Payor: MEDICARE / Plan: MEDICARE PART A AND B / Product Type: *No Product type* /    Admitting Diagnosis: LT FRONTAL HEMMORRHAGE  Admit Date/Time:  08/26/2015  4:42 PM Admission Comments: No comment available   Primary Diagnosis:  Hemiplegia and hemiparesis following nontraumatic intracerebral hemorrhage affecting right dominant side (HCC) Principal Problem: Hemiplegia and hemiparesis following nontraumatic intracerebral hemorrhage affecting right dominant side [facility])  Patient Active Problem List   Diagnosis Date Noted  . Mass of lung   . Mass of lower lobe of left lung   . Pain and swelling of ankle   . CRF (chronic renal failure)   . Urinary retention   . Hypertensive emergency 08/26/2015  . Hyperlipidemia 08/26/2015  . Obesity 08/26/2015  . Lung mass 08/26/2015  . Incontinence 08/26/2015  . Hemorrhagic stroke ([place]) 08/26/2015  . Hemiplegia and hemiparesis following nontraumatic intracerebral hemorrhage affecting right dominant side ([name]) 08/26/2015  . Aphasia following nontraumatic intracerebral hemorrhage 08/26/2015  . Gait disturbance, post-stroke 08/26/2015  . Benign essential HTN   . OSA (obstructive sleep apnea)   . CKD (chronic kidney disease)   . Pacemaker   . ICH (intracerebral hemorrhage) ([place]) - L frontal due to HTN vs CAA 08/22/2015    Expected Discharge Date: Expected Discharge Date: 09/10/15  Team Members Present: Physician leading conference: Dr. Delice Lesch Social Worker Present: Alfonse Alpers, LCSW Nurse Present: Junius Creamer, RN PT Present: Dwyane Dee, PT;Austin Berline Lopes, PT;Victoria Miller, Dustin Folks, PT OT Present: Benay Pillow, OT SLP Present: Weston Anna, SLP PPS Coordinator present :  Daiva Nakayama, RN, CRRN     Current Status/Progress Goal Weekly Team Focus  Medical   Right hemiparesis, aphasia, apraxia and cognitive deficits secondary to Left frontal intracranial hypertensive hemorrhage with urinary retention  Improve cognition, mobility, urinary retention  see above   Bowel/Bladder   continent of bowel and bladder. uses urinal to void.  remain continent of bowel and bladder.  monitor for no void >6-8 hours   Swallow/Nutrition/ Hydration   Regular textures with thin liquids, Mod I for use of swallow strategies   Mod I  Goals Met   ADL's   supervision with RW for self care tasks, with steady assist for LB clothing management  supervision overall  self care retraining, d/c planning, activity tolerance   Mobility   supervision overall for mobility, min assist on stairs and compliant surfaces  supervision overall  progressive cognitive challenges during mobility tasks   Communication   Max A for functional communiction, Min A for comprehension of basic information   Max A for functional communication, Min A for comprehension of basic information   comprehension of basic commands, functional communication    Safety/Cognition/ Behavioral Observations  Supervison  Supervision   awareness   Pain   denies pain.  pain less than 2  monitor for pain and medicate prn.   Skin   No skin breakdown.  monitor skin q shift prn  No new skin breakdown.    Rehab Goals Patient on target to meet rehab goals: Yes Rehab Goals Revised: none *See Care Plan and progress notes for long and short-term goals.  Barriers to Discharge: safety, mobility, transfers, urinary retention  Possible Resolutions to Barriers:  follow labs, pt and family edu, may need SNF, weaning urinary meds as function improving    Discharge Planning/Teaching Needs:  Family has decided that pt going to SNF at [facility] is the best option at this time.  Family will then prepare for pt's return to their apt by  having handrails installed and hiring caregivers, as needed.  none - Pt to SNF.  Family has been present for therapies.   Team Discussion:  Pt's labs are stable and urinary retention is better and Dr. Posey Pronto is weaning pt's medications.  Pt should be on track for SNF transfer on 09-10-15.  PT reported that pt is supervision for transfers and gait.  His challenges are with problem solving and poor tolerance for frustration.  OT stated that pt did better with cueing today.  He fluctuates, but overall is supervision with rolling walker.  ST shared that pt has trouble recognizing/naming common objects due to aphasia and ataxia.  Pt's reading is intact and writing is functional.  Pt has anxiety and frustration when he can't figure things out, so he does better with shorter, daily SLP sessions.  Revisions to Treatment Plan:  none   Continued Need for Acute Rehabilitation Level of Care: The patient requires daily medical management by a physician with specialized training in physical medicine and rehabilitation for the following conditions: Daily direction of a multidisciplinary physical rehabilitation program to ensure safe treatment while eliciting the highest outcome that is of practical value to the patient.: Yes Daily medical management of patient stability for increased activity during participation in an intensive rehabilitation regime.: Yes Daily analysis of laboratory values and/or radiology reports with any subsequent need for medication adjustment of medical intervention for : Neurological problems;Urological problems  Suzzanne Brunkhorst, [name] 09/10/2015, 11:10 AM

## 2015-09-14 ENCOUNTER — Telehealth: Payer: Self-pay

## 2015-09-14 DIAGNOSIS — R918 Other nonspecific abnormal finding of lung field: Secondary | ICD-10-CM

## 2015-09-14 DIAGNOSIS — I619 Nontraumatic intracerebral hemorrhage, unspecified: Secondary | ICD-10-CM

## 2015-09-14 DIAGNOSIS — F39 Unspecified mood [affective] disorder: Secondary | ICD-10-CM

## 2015-09-14 DIAGNOSIS — N400 Enlarged prostate without lower urinary tract symptoms: Secondary | ICD-10-CM

## 2015-09-14 DIAGNOSIS — G8191 Hemiplegia, unspecified affecting right dominant side: Secondary | ICD-10-CM

## 2015-09-14 NOTE — Telephone Encounter (Signed)
Spoke with a nurse to make aware of pt's appt on 09/23/15 @ 12:00 pm.

## 2015-09-16 ENCOUNTER — Other Ambulatory Visit: Payer: Self-pay | Admitting: Hematology

## 2015-09-16 ENCOUNTER — Encounter (HOSPITAL_COMMUNITY)
Admission: RE | Admit: 2015-09-16 | Discharge: 2015-09-16 | Disposition: A | Discharge status: Home / Self Care | Payer: No Typology Code available for payment source | Source: Ambulatory Visit | Attending: Hematology | Admitting: Hematology

## 2015-09-16 DIAGNOSIS — M899 Disorder of bone, unspecified: Secondary | ICD-10-CM | POA: Diagnosis not present

## 2015-09-16 DIAGNOSIS — R918 Other nonspecific abnormal finding of lung field: Secondary | ICD-10-CM | POA: Diagnosis not present

## 2015-09-16 DIAGNOSIS — E079 Disorder of thyroid, unspecified: Secondary | ICD-10-CM | POA: Diagnosis not present

## 2015-09-16 LAB — GLUCOSE, CAPILLARY: GLUCOSE-CAPILLARY: 107 mg/dL — AB (ref 65–99)

## 2015-09-16 MED ORDER — FLUDEOXYGLUCOSE F - 18 (FDG) INJECTION
9.5000 | Freq: Once | INTRAVENOUS | Status: AC | PRN
  Administered 2015-09-16: 9.5 via INTRAVENOUS

## 2015-09-17 ENCOUNTER — Ambulatory Visit (HOSPITAL_BASED_OUTPATIENT_CLINIC_OR_DEPARTMENT_OTHER): Payer: Medicare Other | Admitting: Hematology

## 2015-09-17 ENCOUNTER — Encounter: Payer: Self-pay | Admitting: Hematology

## 2015-09-17 ENCOUNTER — Telehealth: Payer: Self-pay | Admitting: Hematology

## 2015-09-17 ENCOUNTER — Ambulatory Visit (HOSPITAL_COMMUNITY): Admission: RE | Admit: 2015-09-17 | Payer: Medicare Other | Source: Ambulatory Visit

## 2015-09-17 VITALS — BP 136/85 | HR 86 | Temp 97.9°F | Resp 18 | Ht 69.0 in | Wt 201.4 lb

## 2015-09-17 DIAGNOSIS — R918 Other nonspecific abnormal finding of lung field: Secondary | ICD-10-CM | POA: Diagnosis present

## 2015-09-17 DIAGNOSIS — I619 Nontraumatic intracerebral hemorrhage, unspecified: Secondary | ICD-10-CM

## 2015-09-17 DIAGNOSIS — L989 Disorder of the skin and subcutaneous tissue, unspecified: Secondary | ICD-10-CM | POA: Diagnosis not present

## 2015-09-17 NOTE — Progress Notes (Signed)
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 He was found to have left frontal hemorrhage 5.7 cm x 4.5 cm with 6 mm left to right shift.During the workup for his stroke, a left upper lung mass was noticed on the CT scan. I was called to evaluate his lung mass.  Patient has aphasia, not able to communicate much. According to his daughter, Dr. [name], who is a OB GYN at [employer], he probably only smoked for very short period time during the college. He did have 3 skin melanoma, which was removed, last one was 2-3 years ago. No other cancer history.  INTERVAL HISTORY: Mr. Burmester returns for follow-up. He is accompanied by his wife, daughter and a son to my clinic today. I initially saw him in the hospital when he had a stroke, and workup showed a left lung mass.  He has been recovering well from the stroke. Her aphasia has mostly resolved. She was discharged from inpatient rehabilitation to a nursing home last week. She is able to walk independently, with a walker. No significant residual weakness. His swallow function is fine, he has good appetite and eats well. He denies any significant pain, cough, dyspnea, abdominal discomfort, or other symptoms.  REVIEW OF SYSTEMS:   Constitutional: Denies fevers, chills or abnormal weight loss Eyes: Denies blurriness of vision Ears, nose, mouth, throat, and face: Denies mucositis or sore throat Respiratory: Denies cough, dyspnea or wheezes Cardiovascular: Denies palpitation, chest discomfort or lower extremity swelling Gastrointestinal:  Denies nausea, heartburn or change in bowel habits Skin: Denies  abnormal skin rashes Lymphatics: Denies new lymphadenopathy or easy bruising Neurological:see HPI  Behavioral/Psych: Mood is stable, no new changes  All other systems were reviewed with the patient and are negative.  MEDICAL HISTORY:  Past Medical History  Diagnosis Date  . Hypertension   . Sleep apnea     uses CPAP  . High cholesterol   . Renal disorder   . Pacemaker     due to syncope  . Rectal bleed     due to NSAIDS  . Melanoma ([place])     SURGICAL HISTORY: Past Surgical History  Procedure Laterality Date  . Cardiac surgery      bypass X 2  . Knee arthroscopy  12/2008  . Foot surgery  08/2010    I have reviewed the social history and family history with the patient and they are unchanged from previous note.  ALLERGIES:  is allergic to zocor.  MEDICATIONS:  Current Outpatient Prescriptions  Medication Sig Dispense Refill  . acetaminophen (TYLENOL) 325 MG tablet Take 2 tablets (650 mg total) by mouth every 6 (six) hours as needed for mild pain.    Marland Kitchen alfuzosin (UROXATRAL) 10 MG 24 hr tablet Take 1 tablet (10 mg total) by mouth daily with breakfast.    . bisacodyl (DULCOLAX) 10 MG suppository Place 1 suppository (10 mg total) rectally daily as needed for moderate constipation. 12 suppository 0  . carvedilol (COREG) 3.125 MG tablet Take 3.125 mg by mouth 2 (two) times daily with a meal.    . levothyroxine (SYNTHROID, LEVOTHROID) 125 MCG tablet Take 125 mcg by mouth daily before breakfast.    .  Multiple Vitamin (MULTIVITAMIN WITH MINERALS) TABS tablet Take 1 tablet by mouth daily.    . pantoprazole (PROTONIX) 40 MG tablet Take 1 tablet (40 mg total) by mouth daily.    Marland Kitchen PRESCRIPTION MEDICATION Inhale into the lungs at bedtime. CPAP    . rosuvastatin (CRESTOR) 10 MG tablet Take 10 mg by mouth at bedtime.    . selenium sulfide (SELSUN) 1 % LOTN Apply 1 application topically daily. To head and face and rinse off    . senna-docusate (SENOKOT-S) 8.6-50 MG tablet Take 1 tablet by  mouth 2 (two) times daily.    . sertraline (ZOLOFT) 50 MG tablet Take 2.5 tablets (125 mg total) by mouth daily.    . Turmeric Curcumin 500 MG CAPS Take 500 mg by mouth 2 (two) times daily.     No current facility-administered medications for this visit.    PHYSICAL EXAMINATION: ECOG PERFORMANCE STATUS: 2 - Symptomatic, <50% confined to bed  Filed Vitals:   09/17/15 1619  BP: 136/85  Pulse: 86  Temp: 97.9 F (36.6 C)  Resp: 18   Filed Weights   09/17/15 1619  Weight: 201 lb 6.4 oz (91.354 kg)    GENERAL:alert, no distress and comfortable SKIN: skin color, texture, turgor are normal, no rashes or significant lesions EYES: normal, Conjunctiva are pink and non-injected, sclera clear OROPHARYNX:no exudate, no erythema and lips, buccal mucosa, and tongue normal  NECK: supple, thyroid normal size, non-tender, without nodularity LYMPH:  no palpable lymphadenopathy in the cervical, [place], axillary or inguinal LUNGS: clear to auscultation and percussion with normal breathing effort HEART: regular rate & rhythm and no murmurs and no lower extremity edema ABDOMEN:abdomen soft, non-tender and normal bowel sounds Musculoskeletal:no cyanosis of digits and no clubbing  NEURO: alert & oriented x 3 with fluent speech, no focal motor/sensory deficits  LABORATORY DATA:  I have reviewed the data as listed CBC Latest Ref Rng 09/04/2015 08/29/2015 08/28/2015  WBC 4.0 - 10.5 K/uL 8.6 6.7 7.3  Hemoglobin 13.0 - 17.0 g/dL 12.8(L) 13.0 14.2  Hematocrit 39.0 - 52.0 % 37.5(L) 38.7(L) 41.3  Platelets 150 - 400 K/uL 344 232 240     CMP Latest Ref Rng 09/04/2015 09/03/2015 09/02/2015  Glucose 65 - 99 mg/dL 100(H) 92 103(H)  BUN 6 - 20 mg/dL 31(H) 36(H) 40(H)  Creatinine 0.61 - 1.24 mg/dL 1.08 1.26(H) 1.30(H)  Sodium 135 - 145 mmol/L 136 137 135  Potassium 3.5 - 5.1 mmol/L 4.4 4.4 4.5  Chloride 101 - 111 mmol/L 104 106 104  CO2 22 - 32 mmol/L '24 22 23  '$ Calcium 8.9 - 10.3 mg/dL 9.0 8.8(L) 9.0       RADIOGRAPHIC STUDIES: I have personally reviewed the radiological images as listed and agreed with the findings in the report. Nm Pet Image Initial (pi) Skull Base To Thigh  09/16/2015  CLINICAL DATA:  Initial treatment strategy for lung mass. EXAM: NUCLEAR MEDICINE PET SKULL BASE TO THIGH TECHNIQUE: 9.5 mCi F-18 FDG was injected intravenously. Full-ring PET imaging was performed from the skull base to thigh after the radiotracer. CT data was obtained and used for attenuation correction and anatomic localization. FASTING BLOOD GLUCOSE:  Value: 107 mg/dl COMPARISON:  CT chest 09/01/2015. FINDINGS: NECK No hypermetabolic lung of the neck. CT images show no acute findings. CHEST Inferior lingular nodule measures 2.2 x 2.5 cm (series 8, image 42) with an SUV max of 17.7. 6 mm right upper lobe nodule (series 8, image 20) has an SUV max 2.0. 3 mm  nodule along the minor fissure (image 33), too small for PET resolution. No hypermetabolic mediastinal, hilar or axillary lymph nodes. There is focal hypermetabolism in the right lobe of the thyroid, which is slightly asymmetrically enlarged with an SUV max 6.8. Faint soft tissue nodule overlying the distal left clavicle measures 9 mm (CT image 51) with an SUV max of 10.2. 1.7 cm low-attenuation extrapleural lesion in the posteromedial mid right hemi thorax (series 4, image 83), shows no abnormal hypermetabolism and may represent a neurogenic tumor. No pericardial effusion. There may be a tiny right pleural effusion. ABDOMEN/PELVIS An 8 mm nodule in or adjacent to the left paraspinous musculature at the L1-2 level measures 8 mm (series 4, image 128) with an SUV max of 6.9. Subcutaneous lesion overlying the right gluteus musculature measures 8 mm (image 170) with an SUV max of 4.0. No abnormal hypermetabolism in the liver, adrenal glands, spleen or pancreas. No hypermetabolic lymph nodes. CT images show the liver, gallbladder, adrenal glands to be unremarkable.  Low-attenuation lesions in the kidneys measure up to approximately 1.7 cm and are difficult to further characterize due to size and/or lack of post-contrast imaging. Kidneys, spleen, pancreas, stomach and bowel are otherwise grossly unremarkable. SKELETON Slightly increased marrow attenuation is seen in the proximal right humerus (series 4, images 60-70). This is best seen on soft tissue windows. Associated SUV max of 10.5. There is cortical hypermetabolism along the greater trochanter of the right femur, where there may be slight cortical thickening (series 4, image 196), SUV max 6.1. No additional abnormal osseous hypermetabolism. IMPRESSION: 1. Hypermetabolic lingular nodule with hypermetabolic right upper lobe nodule, subcutaneous nodules and osseous lesions. Distribution is not typical of primary bronchogenic carcinoma, although not excluded. Melanoma could have this appearance. Consider MR of the proximal right humerus without and with contrast in further evaluation. Percutaneous tissue sampling of lingular lesion may be useful, as clinically indicated. 2. Hypermetabolic right thyroid lesion. Ultrasound is recommended in further evaluation as malignancy cannot be excluded. Electronically Signed   By: Lorin Picket M.D.   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 He was incidentally found to have a 2.8 cm mass in the left upper lobe of lung.  1. LUL lung mass, possible bone and subcutaneous metastasis -I personally reviewed his PET/CT scan images with pt and his family members. The scan revealed hypermetabolic left upper lobe lung mass, 2 small right lung nodules, probable bone metastasis, and a subcutaneous left supraclavicular lesion, which is not palpable  -I discussed with her daughter that malignancy is highly  suspected, could be a primary lung cancer, versus a metastatic melanoma, given his prior history of skin melanoma  -I recommend tissue biopsy. I suggest [place] to look for the left [place] subcutaneous lesion, if this visible, consider core needle biopsy. If ultrasound could not find the lesion, then I would recommend a CT-guided left lung biopsy. The lesion is peripheral, I think percutaneous needle biopsy by interventional radiology is probably feasible.  -Bronchoscopy with chemotherapy and the navigation biopsy of the left lung nodule is also possible, but would require a general anesthesia. Patient had recent hemorrhagic stroke, would like to avoid general anesthesia if possible.  -We briefly discussed that tolerated therapy and immunotherapy available for post metastatic lung and melanoma, in addition to chemotherapy. Given his recent stroke and advanced  age, tolerated therapy or immunotherapy are preferred if he is a candidate.  2. Hemorrhagic stroke -I encouraged him to continue physical therapy and occupational therapy. He has recovered well -We'll repeat CT has in 4-8 weeks, to ruled out brain metastasis after the hemorrhage resolves.  Plan -IR consult for US guided left [place] subcutaneous nodule biopsy, or CT-guided left lung mass biopsy -Motor testing will be ordered if tissue biopsy confirms malignancy -I'll see him back in 2-3 weeks to discuss biopsy results.  All questions were answered. The patient knows to call the clinic with any problems, questions or concerns. No barriers to learning was detected.  I spent 40 minutes counseling the patient face to face. The total time spent in the appointment was 55 minutes and more than 50% was on counseling and review of test results     [name], MD 09/17/2015

## 2015-09-17 NOTE — Telephone Encounter (Signed)
s.w pt wife and advised on todays appt....he is in the hospital at [facility].Marland KitchenMarland Kitchen

## 2015-09-18 ENCOUNTER — Encounter: Payer: Self-pay | Admitting: Hematology

## 2015-09-20 ENCOUNTER — Other Ambulatory Visit: Payer: Self-pay | Admitting: General Surgery

## 2015-09-21 ENCOUNTER — Encounter (HOSPITAL_COMMUNITY): Payer: Self-pay

## 2015-09-21 ENCOUNTER — Ambulatory Visit (HOSPITAL_COMMUNITY)
Admission: RE | Admit: 2015-09-21 | Discharge: 2015-09-21 | Disposition: A | Discharge status: Home / Self Care | Payer: Medicare Other | Source: Ambulatory Visit | Attending: Hematology | Admitting: Hematology

## 2015-09-21 DIAGNOSIS — C773 Secondary and unspecified malignant neoplasm of axilla and upper limb lymph nodes: Secondary | ICD-10-CM | POA: Diagnosis not present

## 2015-09-21 DIAGNOSIS — Z951 Presence of aortocoronary bypass graft: Secondary | ICD-10-CM | POA: Diagnosis not present

## 2015-09-21 DIAGNOSIS — I252 Old myocardial infarction: Secondary | ICD-10-CM | POA: Insufficient documentation

## 2015-09-21 DIAGNOSIS — Z85820 Personal history of malignant melanoma of skin: Secondary | ICD-10-CM | POA: Diagnosis not present

## 2015-09-21 DIAGNOSIS — Z79899 Other long term (current) drug therapy: Secondary | ICD-10-CM | POA: Insufficient documentation

## 2015-09-21 DIAGNOSIS — Z87891 Personal history of nicotine dependence: Secondary | ICD-10-CM | POA: Diagnosis not present

## 2015-09-21 DIAGNOSIS — R590 Localized enlarged lymph nodes: Secondary | ICD-10-CM | POA: Diagnosis present

## 2015-09-21 DIAGNOSIS — G4730 Sleep apnea, unspecified: Secondary | ICD-10-CM | POA: Insufficient documentation

## 2015-09-21 DIAGNOSIS — Z950 Presence of cardiac pacemaker: Secondary | ICD-10-CM | POA: Insufficient documentation

## 2015-09-21 DIAGNOSIS — Z801 Family history of malignant neoplasm of trachea, bronchus and lung: Secondary | ICD-10-CM | POA: Diagnosis not present

## 2015-09-21 DIAGNOSIS — E7800 Pure hypercholesterolemia, unspecified: Secondary | ICD-10-CM | POA: Insufficient documentation

## 2015-09-21 DIAGNOSIS — R918 Other nonspecific abnormal finding of lung field: Secondary | ICD-10-CM

## 2015-09-21 DIAGNOSIS — I10 Essential (primary) hypertension: Secondary | ICD-10-CM | POA: Insufficient documentation

## 2015-09-21 DIAGNOSIS — N289 Disorder of kidney and ureter, unspecified: Secondary | ICD-10-CM | POA: Insufficient documentation

## 2015-09-21 DIAGNOSIS — Z888 Allergy status to other drugs, medicaments and biological substances status: Secondary | ICD-10-CM | POA: Diagnosis not present

## 2015-09-21 LAB — CBC
HCT: 37.9 % — ABNORMAL LOW (ref 39.0–52.0)
Hemoglobin: 13.5 g/dL (ref 13.0–17.0)
MCH: 31.6 pg (ref 26.0–34.0)
MCHC: 35.6 g/dL (ref 30.0–36.0)
MCV: 88.8 fL (ref 78.0–100.0)
PLATELETS: 240 10*3/uL (ref 150–400)
RBC: 4.27 MIL/uL (ref 4.22–5.81)
RDW: 14 % (ref 11.5–15.5)
WBC: 5.2 10*3/uL (ref 4.0–10.5)

## 2015-09-21 LAB — PROTIME-INR
INR: 1.21 (ref 0.00–1.49)
PROTHROMBIN TIME: 15 s (ref 11.6–15.2)

## 2015-09-21 LAB — APTT: aPTT: 40 seconds — ABNORMAL HIGH (ref 24–37)

## 2015-09-21 MED ORDER — MIDAZOLAM HCL 2 MG/2ML IJ SOLN
INTRAMUSCULAR | Status: AC
  Filled 2015-09-21: qty 6

## 2015-09-21 MED ORDER — SODIUM CHLORIDE 0.9 % IV SOLN: INTRAVENOUS | Status: DC

## 2015-09-21 MED ORDER — FENTANYL CITRATE (PF) 100 MCG/2ML IJ SOLN
INTRAMUSCULAR | Status: AC
  Filled 2015-09-21: qty 4

## 2015-09-21 MED ORDER — FENTANYL CITRATE (PF) 100 MCG/2ML IJ SOLN
INTRAMUSCULAR | Status: AC | PRN
  Administered 2015-09-21 (×2): 25 ug via INTRAVENOUS

## 2015-09-21 MED ORDER — MIDAZOLAM HCL 2 MG/2ML IJ SOLN
INTRAMUSCULAR | Status: AC | PRN
Start: 2015-09-21 — End: 2015-09-21
  Administered 2015-09-21: 0.5 mg via INTRAVENOUS
  Administered 2015-09-21: 1 mg via INTRAVENOUS

## 2015-09-21 NOTE — H&P (Signed)
Chief Complaint: lymphadenopathy, lung mass  Referring Physician:Dr. [name]  Supervising Physician: Sandi Mariscal  Patient Status: Out-pt  HPI: David [name] is an 75 y.o. male with a history of melanoma as well as aphasia secondary to an Century.  During that stay he was found to have a left lung mass.  He has since had a PET scan that revealed hypermetabolic activity in the right thyroid, left lung mass, and left supraclavicular LN.  We have been asked to biopsy this LN for hopefully some more information.  He states he lives with his wife in [place].  He has otherwise been feeling well.  Past Medical History:  Past Medical History  Diagnosis Date  . Hypertension   . Sleep apnea     uses CPAP  . High cholesterol   . Renal disorder   . Pacemaker     due to syncope  . Rectal bleed     due to NSAIDS  . Melanoma ([place])   . Skin cancer   . Myocardial infarction [facility])     Past Surgical History:  Past Surgical History  Procedure Laterality Date  . Cardiac surgery      bypass X 2  . Knee arthroscopy  12/2008  . Foot surgery  08/2010    Family History:  Family History  Problem Relation Age of Onset  . Cancer Mother 77    lung   . Hypertension Mother   . Hypertension Father   . Heart attack Father   . Rheum arthritis Sister     Social History:  reports that he quit smoking about 96 years ago. He does not have any smokeless tobacco history on file. He reports that he drinks about 2.4 oz of alcohol per week. He reports that he does not use illicit drugs.  Allergies:  Allergies  Allergen Reactions  . Zocor [Simvastatin] Other (See Comments)    Fever - temp of 103.   In 2003    Medications:   Medication List    ASK your doctor about these medications        acetaminophen 325 MG tablet  Commonly known as:  TYLENOL  Take 2 tablets (650 mg total) by mouth every 6 (six) hours as needed for mild pain.     alfuzosin 10 MG 24 hr tablet  Commonly known as:  UROXATRAL    Take 1 tablet (10 mg total) by mouth daily with breakfast.     bisacodyl 10 MG suppository  Commonly known as:  DULCOLAX  Place 1 suppository (10 mg total) rectally daily as needed for moderate constipation.     carvedilol 3.125 MG tablet  Commonly known as:  COREG  Take 3.125 mg by mouth 2 (two) times daily with a meal.     levothyroxine 125 MCG tablet  Commonly known as:  SYNTHROID, LEVOTHROID  Take 125 mcg by mouth daily before breakfast.     multivitamin with minerals Tabs tablet  Take 1 tablet by mouth daily.     pantoprazole 40 MG tablet  Commonly known as:  PROTONIX  Take 1 tablet (40 mg total) by mouth daily.     PRESCRIPTION MEDICATION  Inhale into the lungs at bedtime. CPAP     rosuvastatin 10 MG tablet  Commonly known as:  CRESTOR  Take 10 mg by mouth at bedtime.     selenium sulfide 1 % Lotn  Commonly known as:  SELSUN  Apply 1 application topically daily. To head and  face and rinse off     senna-docusate 8.6-50 MG tablet  Commonly known as:  Senokot-S  Take 1 tablet by mouth 2 (two) times daily.     sertraline 50 MG tablet  Commonly known as:  ZOLOFT  Take 2.5 tablets (125 mg total) by mouth daily.     Turmeric Curcumin 500 MG Caps  Take 500 mg by mouth 2 (two) times daily.        Please HPI for pertinent positives, otherwise complete 10 system ROS negative.  Mallampati Score: MD Evaluation Airway: WNL Heart: WNL Abdomen: WNL Chest/ Lungs: WNL ASA  Classification: 3 Mallampati/Airway Score: One  Physical Exam: Temp (F)   97.8  97.8 (36.6)  06/13 0957   Pulse Rate   77  77  06/13 0957   Resp   18  18  06/13 0957   BP   116/81  116/81  06/13 0957   SpO2 (%)   95  95  06/13 0957      General: pleasant, WD, WN white male who is laying in bed in NAD HEENT: head is normocephalic, atraumatic.  Sclera are noninjected.  PERRL.  Ears and nose without any masses or lesions.  Mouth is pink and moist Heart: regular, rate, and rhythm.   Normal s1,s2. No obvious murmurs, gallops, or rubs noted.  Palpable radial and pedal pulses bilaterally Lungs/chest: CTAB, no wheezes, rhonchi, or rales noted.  Respiratory effort nonlabored.  Palpable fairly hard LN that is anterior to his clavicle on the left side. Abd: soft, NT, ND, +BS, no masses, hernias, or organomegaly MS: all 4 extremities are symmetrical with no cyanosis, clubbing, or edema. Psych: A&Ox3 with an appropriate affect.   Labs: Results for orders placed or performed during the hospital encounter of 09/21/15 (from the past 48 hour(s))  APTT upon arrival     Status: Abnormal   Collection Time: 09/21/15 10:20 AM  Result Value Ref Range   aPTT 40 (H) 24 - 37 seconds    Comment:        IF BASELINE aPTT IS ELEVATED, SUGGEST PATIENT RISK ASSESSMENT BE USED TO DETERMINE APPROPRIATE ANTICOAGULANT THERAPY.   CBC upon arrival     Status: Abnormal   Collection Time: 09/21/15 10:20 AM  Result Value Ref Range   WBC 5.2 4.0 - 10.5 K/uL   RBC 4.27 4.22 - 5.81 MIL/uL   Hemoglobin 13.5 13.0 - 17.0 g/dL   HCT 37.9 (L) 39.0 - 52.0 %   MCV 88.8 78.0 - 100.0 fL   MCH 31.6 26.0 - 34.0 pg   MCHC 35.6 30.0 - 36.0 g/dL   RDW 14.0 11.5 - 15.5 %   Platelets 240 150 - 400 K/uL  Protime-INR upon arrival     Status: None   Collection Time: 09/21/15 10:20 AM  Result Value Ref Range   Prothrombin Time 15.0 11.6 - 15.2 seconds   INR 1.21 0.00 - 1.49    Imaging: No results found.  Assessment/Plan 1. Left lung mass with left-side supraclavicular lymph node with hypermetabolic activity on PET -this LN is very palpable.  We will plan on biopsying this area under US guidance today. -labs and vitals have been reviewed -Risks and Benefits discussed with the patient including, but not limited to bleeding, infection, damage to adjacent structures or low yield requiring additional tests. All of the patient's questions were answered, patient is agreeable to proceed. Consent signed and in  chart.   Thank you for this interesting consult.  I greatly enjoyed meeting David [name] and look forward to participating in their care.  A copy of this report was sent to the requesting provider on this date.  Electronically Signed: Henreitta Cea 09/21/2015, 11:35 AM   I spent a total of  30 Minutes   in face to face in clinical consultation, greater than 50% of which was counseling/coordinating care for supraclavicular lymphadenopathy

## 2015-09-21 NOTE — Discharge Instructions (Signed)
Needle Biopsy, Care After °These instructions give you information about caring for yourself after your procedure. Your doctor may also give you more specific instructions. Call your doctor if you have any problems or questions after your procedure. °HOME CARE °· Rest as told by your doctor. °· Take medicines only as told by your doctor. °· There are many different ways to close and cover the biopsy site, including stitches (sutures), skin glue, and adhesive strips. Follow instructions from your doctor about: °¨ How to take care of your biopsy site. °¨ When and how you should change your bandage (dressing). °¨ When you should remove your dressing. °¨ Removing whatever was used to close your biopsy site. °· Check your biopsy site every day for signs of infection. Watch for: °¨ Redness, swelling, or pain. °¨ Fluid, blood, or pus. °GET HELP IF: °· You have a fever. °· You have redness, swelling, or pain at the biopsy site, and it lasts longer than a few days. °· You have fluid, blood, or pus coming from the biopsy site. °· You feel sick to your stomach (nauseous). °· You throw up (vomit). °GET HELP RIGHT AWAY IF: °· You are short of breath. °· You have trouble breathing. °· Your chest hurts. °· You feel dizzy or you pass out (faint). °· You have bleeding that does not stop with pressure or a bandage. °· You cough up blood. °· Your belly (abdomen) hurts. °  °This information is not intended to replace advice given to you by your health care provider. Make sure you discuss any questions you have with your health care provider. °  °Document Released: 03/09/2008 Document Revised: 08/11/2014 Document Reviewed: 03/23/2014 °Elsevier Interactive Patient Education ©2016 [employer] °Moderate Conscious Sedation, Adult, Care After °Refer to this sheet in the next few weeks. These instructions provide you with information on caring for yourself after your procedure. Your health care provider may also give you more specific  instructions. Your treatment has been planned according to current medical practices, but problems sometimes occur. Call your health care provider if you have any problems or questions after your procedure. °WHAT TO EXPECT AFTER THE PROCEDURE  °After your procedure: °· You may feel sleepy, clumsy, and have poor balance for several hours. °· Vomiting may occur if you eat too soon after the procedure. °HOME CARE INSTRUCTIONS °· Do not participate in any activities where you could become injured for at least 24 hours. Do not: °· Drive. °· Swim. °· Ride a bicycle. °· Operate heavy machinery. °· Cook. °· Use power tools. °· Climb ladders. °· Work from a high place. °· Do not make important decisions or sign legal documents until you are improved. °· If you vomit, drink water, juice, or soup when you can drink without vomiting. Make sure you have little or no nausea before eating solid foods. °· Only take over-the-counter or prescription medicines for pain, discomfort, or fever as directed by your health care provider. °· Make sure you and your family fully understand everything about the medicines given to you, including what side effects may occur. °· You should not drink alcohol, take sleeping pills, or take medicines that cause drowsiness for at least 24 hours. °· If you smoke, do not smoke without supervision. °· If you are feeling better, you may resume normal activities 24 hours after you were sedated. °· Keep all appointments with your health care provider. °SEEK MEDICAL CARE IF: °· Your skin is pale or bluish in color. °· You   continue to feel nauseous or vomit. °· Your pain is getting worse and is not helped by medicine. °· You have bleeding or swelling. °· You are still sleepy or feeling clumsy after 24 hours. °SEEK IMMEDIATE MEDICAL CARE IF: °· You develop a rash. °· You have difficulty breathing. °· You develop any type of allergic problem. °· You have a fever. °MAKE SURE YOU: °· Understand these  instructions. °· Will watch your condition. °· Will get help right away if you are not doing well or get worse. °  °This information is not intended to replace advice given to you by your health care provider. Make sure you discuss any questions you have with your health care provider. °  °Document Released: 01/15/2013 Document Revised: 04/17/2014 Document Reviewed: 01/15/2013 °Elsevier Interactive Patient Education ©2016 Elsevier Inc. ° °

## 2015-09-21 NOTE — Procedures (Signed)
Technically successful US guided biopsy of hypermetabolic left sub-clavicular LN.   EBL: Minimal   No immediate complications.   Ronny Bacon, MD Pager #: [phone]

## 2015-09-22 ENCOUNTER — Other Ambulatory Visit: Payer: Self-pay | Admitting: Hematology

## 2015-09-22 ENCOUNTER — Telehealth: Payer: Self-pay | Admitting: *Deleted

## 2015-09-22 ENCOUNTER — Inpatient Hospital Stay (HOSPITAL_COMMUNITY): Admit: 2015-09-22 | Payer: Self-pay

## 2015-09-22 ENCOUNTER — Other Ambulatory Visit: Payer: Self-pay | Admitting: Neurology

## 2015-09-22 DIAGNOSIS — B351 Tinea unguium: Secondary | ICD-10-CM

## 2015-09-22 DIAGNOSIS — R918 Other nonspecific abnormal finding of lung field: Secondary | ICD-10-CM

## 2015-09-22 DIAGNOSIS — C799 Secondary malignant neoplasm of unspecified site: Secondary | ICD-10-CM

## 2015-09-22 DIAGNOSIS — I611 Nontraumatic intracerebral hemorrhage in hemisphere, cortical: Secondary | ICD-10-CM

## 2015-09-22 DIAGNOSIS — C439 Malignant melanoma of skin, unspecified: Secondary | ICD-10-CM | POA: Insufficient documentation

## 2015-09-22 NOTE — Telephone Encounter (Signed)
-----  Message from Yan Feng, MD sent at 09/22/2015  3:04 PM EDT ----- ,   Please call pathology and request Foundation One test on his biopsy tissue. If not enough tissue for Foundattion One, then do BRAF mutation test. Please call pt's daughter Dr. leggett and let her know the biopsy showed melanoma, and I recommend Nivolumab IV every 2 weeks, will start the week of 6/26, and I will see him before treatment, chemo class. I will send POF.  Feng, Yan   I will 

## 2015-09-22 NOTE — Telephone Encounter (Signed)
Called daughter, Dr. Silas [place] with info re: diagnosis & treatment & appts per Dr Ernestina Penna request.  Dr [name] would like to be called regarding any communication since mother has dementia & pt doesn't have phone reception in his room. She wanted to know if there was a tumor board that discussed his case & informed that I did not know the answer to this but would try to find out.  Message to [name] & Dr Alen Blew.  She also states that she has talked with a neurologist to schedule a head CT for f/u for mets to brain in a couple of weeks.  Unable to reach Pathology at this late hour & will have RN, Royce Macadamia to call for testing tomorrow.

## 2015-09-23 ENCOUNTER — Telehealth: Payer: Self-pay | Admitting: Hematology

## 2015-09-23 ENCOUNTER — Encounter: Payer: Self-pay | Admitting: Physical Medicine & Rehabilitation

## 2015-09-23 ENCOUNTER — Encounter
Payer: No Typology Code available for payment source | Attending: Physical Medicine & Rehabilitation | Admitting: Physical Medicine & Rehabilitation

## 2015-09-23 ENCOUNTER — Ambulatory Visit: Payer: Medicare Other

## 2015-09-23 VITALS — BP 119/81 | HR 77 | Resp 14

## 2015-09-23 DIAGNOSIS — N189 Chronic kidney disease, unspecified: Secondary | ICD-10-CM | POA: Diagnosis not present

## 2015-09-23 DIAGNOSIS — N400 Enlarged prostate without lower urinary tract symptoms: Secondary | ICD-10-CM

## 2015-09-23 DIAGNOSIS — I252 Old myocardial infarction: Secondary | ICD-10-CM | POA: Insufficient documentation

## 2015-09-23 DIAGNOSIS — Z87891 Personal history of nicotine dependence: Secondary | ICD-10-CM | POA: Insufficient documentation

## 2015-09-23 DIAGNOSIS — R339 Retention of urine, unspecified: Secondary | ICD-10-CM

## 2015-09-23 DIAGNOSIS — R748 Abnormal levels of other serum enzymes: Secondary | ICD-10-CM

## 2015-09-23 DIAGNOSIS — N401 Enlarged prostate with lower urinary tract symptoms: Secondary | ICD-10-CM | POA: Insufficient documentation

## 2015-09-23 DIAGNOSIS — I611 Nontraumatic intracerebral hemorrhage in hemisphere, cortical: Secondary | ICD-10-CM | POA: Diagnosis not present

## 2015-09-23 DIAGNOSIS — R338 Other retention of urine: Secondary | ICD-10-CM | POA: Diagnosis not present

## 2015-09-23 DIAGNOSIS — S93402S Sprain of unspecified ligament of left ankle, sequela: Secondary | ICD-10-CM

## 2015-09-23 DIAGNOSIS — Z5189 Encounter for other specified aftercare: Secondary | ICD-10-CM | POA: Diagnosis not present

## 2015-09-23 DIAGNOSIS — I129 Hypertensive chronic kidney disease with stage 1 through stage 4 chronic kidney disease, or unspecified chronic kidney disease: Secondary | ICD-10-CM | POA: Diagnosis not present

## 2015-09-23 DIAGNOSIS — I69151 Hemiplegia and hemiparesis following nontraumatic intracerebral hemorrhage affecting right dominant side: Secondary | ICD-10-CM | POA: Diagnosis present

## 2015-09-23 DIAGNOSIS — G4733 Obstructive sleep apnea (adult) (pediatric): Secondary | ICD-10-CM | POA: Diagnosis not present

## 2015-09-23 DIAGNOSIS — I69219 Unspecified symptoms and signs involving cognitive functions following other nontraumatic intracranial hemorrhage: Secondary | ICD-10-CM | POA: Diagnosis not present

## 2015-09-23 DIAGNOSIS — I69120 Aphasia following nontraumatic intracerebral hemorrhage: Secondary | ICD-10-CM | POA: Diagnosis not present

## 2015-09-23 DIAGNOSIS — E7800 Pure hypercholesterolemia, unspecified: Secondary | ICD-10-CM | POA: Diagnosis not present

## 2015-09-23 DIAGNOSIS — R7989 Other specified abnormal findings of blood chemistry: Secondary | ICD-10-CM

## 2015-09-23 NOTE — Progress Notes (Signed)
Subjective:    Patient ID: David [name], male    DOB: 11/24/1940, 75 y.o.   MRN: [id number]  HPI 75 y.o. male with history of HTN, OSA, CKD, PPM presents for transitional care management after being discharged from the hospital after have left frontal hemorrhage. Wife (with dementia) present as well. He had deficits with hemiparesis, aphasia, apraxia, and cognition.   Admit date: 08/26/2015 Discharge date: 09/10/2015 Pt was discharged to SNF.  At the time of discharge he was encouraged to follow up with Pulm and Heme/Onc. He has since had a bx of sub-clavicular LN.  He is scheduled to have a repeat head CT in 4-8 weeks. He continues to have times toileting.  He is getting good fluids.  He is unsure if his labs have been drawn (he was told to have a BMET).  He is uncertain if he has seen the Neurology.   He denies urinary incontinence.  Denies falls.  Therapies: At SNF DME: At [facility] Mobility Walker for mobility  Pain Inventory Average Pain 4 Pain Right Now 4 My pain is dull  In the last 24 hours, has pain interfered with the following? General activity 3 Relation with others 3 Enjoyment of life 3 What TIME of day is your pain at its worst? morning Sleep (in general) Good  Pain is worse with: walking Pain improves with: medication Relief from Meds: 3  Mobility walk without assistance walk with assistance use a cane use a walker how many minutes can you walk? 10 ability to climb steps?  yes do you drive?  yes Do you have any goals in this area?  yes  Function retired I need assistance with the following:  bathing, toileting, meal prep, household duties and shopping  Neuro/Psych trouble walking  Prior Studies hospital follow up  Physicians involved in your care hospital follow up   Family History  Problem Relation Age of Onset  . Cancer Mother 44    lung   . Hypertension Mother   . Hypertension Father   . Heart attack Father   . Rheum arthritis Sister     Social History   Social History  . Marital Status: Single    Spouse Name: N/A  . Number of Children: N/A  . Years of Education: N/A   Social History Main Topics  . Smoking status: Former Smoker -- 1.00 packs/day for 3 years    Quit date: 04/11/1919  . Smokeless tobacco: None  . Alcohol Use: 2.4 oz/week    4 Glasses of wine per week     Comment: daily for the past 10 years   . Drug Use: No  . Sexual Activity: Not Asked   Other Topics Concern  . None   Social History Narrative   Past Surgical History  Procedure Laterality Date  . Cardiac surgery      bypass X 2  . Knee arthroscopy  12/2008  . Foot surgery  08/2010   Past Medical History  Diagnosis Date  . Hypertension   . Sleep apnea     uses CPAP  . High cholesterol   . Renal disorder   . Pacemaker     due to syncope  . Rectal bleed     due to NSAIDS  . Melanoma ([place])   . Skin cancer   . Myocardial infarction ([facility])    BP 119/81 mmHg  Pulse 77  Resp 14  SpO2 91%  Opioid Risk Score:   Fall Risk Score:  `1  Depression screen PHQ 2/9  Depression screen PHQ 2/9 09/23/2015  Decreased Interest 0  Down, Depressed, Hopeless 0  PHQ - 2 Score 0  Altered sleeping 0  Tired, decreased energy 0  Change in appetite 0  Feeling bad or failure about yourself  0  Trouble concentrating 0  Moving slowly or fidgety/restless 0  Suicidal thoughts 0  PHQ-9 Score 0    Review of Systems  Eyes: Negative for visual disturbance.  Respiratory: Positive for apnea.   Genitourinary: Positive for difficulty urinating.  Musculoskeletal: Negative for back pain, arthralgias and neck pain.  Neurological: Positive for weakness. Negative for dizziness, numbness and headaches.  All other systems reviewed and are negative.     Objective:   Physical Exam Gen. no acute distress. Vital signs reviewed. Well-developed, well-nourished. HENT: Normocephalic atraumatic Eyes: Conjunctivae and EOM normal Cardio: RRR and No murmur Resp:  CTA B/L and unlabored GI: BS positive and nondistended nontender Musc/Skel: Edema and tenderness in left ankle. Neuro:   Global aphasia, Expressive >> receptive (significantly improved with ability to converse) Alert and oriented x3 Motor: 4+/5 in the right deltoid, biceps, triceps, grip, hip flexor, knee extensor, ankle dorsiflex and plantar flexor.   5/5 on the left side in the same muscle groups. Sensation intact to light touch No ataxia. Skin:   Intact. Warm and dry.    Assessment & Plan:  75 y.o. male with history of HTN, OSA, CKD, PPM presents for transitional care management after being discharged from the hospital after have left frontal hemorrhage.  1. Left frontal intracranial hypertensive hemorrhage Sequela of right hemiparesis, aphasia, apraxia, and cognitive deficits.  Appears to have significant improvement.   Cont therapies  Follow up with Neurology, Dr. Erlinda Hong.  If no scheduled appointment, please call office (pt unaware of appointment)  2. Fluids/Electrolytes/Nutrition:  Please order BMP and follow up on results. Abnormal in hospital  Encourage fluid intake  3. LLL mass:  Follow up plans per Heme/Onc.  Await results of bx.  Follow up as scheduled  4.  Urinary retention/BPH.    Likely due to cognitive and processing issues  Bethanochol d/ced prior to discharge.  Pt unclear if retaining.  May resuming 5 mg TID if retention.   Cont Uroxatral  5. Left ankle sprain  Denies pain  Hx of fracture on Xray  WBAT  Rest, Ice, Compression, elevation  Pain meds as needed  Cont cast  Follow up in 3 months  Meds reviewed Referrals reviewed. All questions answered

## 2015-09-23 NOTE — Telephone Encounter (Signed)
per pof to sch pt appt-mailed copy both voicemails were full

## 2015-09-24 ENCOUNTER — Telehealth: Payer: Self-pay | Admitting: Hematology

## 2015-09-24 NOTE — Progress Notes (Signed)
Jan called pathology on thurs 09/23/15 for Foundation One testing.

## 2015-09-24 NOTE — Telephone Encounter (Signed)
cld & spoke to [name] and Harvin Hazel of time & date of appt on 6/21'@10'$ -adv t get updated copy at chemo class

## 2015-09-28 ENCOUNTER — Telehealth: Payer: Self-pay | Admitting: Neurology

## 2015-09-28 ENCOUNTER — Telehealth: Payer: Self-pay | Admitting: Hematology

## 2015-09-28 DIAGNOSIS — C7800 Secondary malignant neoplasm of unspecified lung: Secondary | ICD-10-CM | POA: Diagnosis not present

## 2015-09-28 DIAGNOSIS — M159 Polyosteoarthritis, unspecified: Secondary | ICD-10-CM | POA: Diagnosis not present

## 2015-09-28 DIAGNOSIS — C439 Malignant melanoma of skin, unspecified: Secondary | ICD-10-CM | POA: Diagnosis not present

## 2015-09-28 NOTE — Telephone Encounter (Signed)
Pt's daughter called said pt's CT scan needs to have PA. Pt has not been seen in our clinic. I tried to explain that this would probably not be done by the clinic but I would send the message to Dr Erlinda Hong.  She is Dr @ ED at [facility].

## 2015-09-28 NOTE — Telephone Encounter (Signed)
pt daughter cld to get updated schfor pt-stated may chage chemo class-keep what is scheduled for now

## 2015-09-29 ENCOUNTER — Other Ambulatory Visit: Payer: Medicare Other

## 2015-09-29 ENCOUNTER — Encounter: Payer: Self-pay | Admitting: *Deleted

## 2015-09-29 NOTE — Telephone Encounter (Signed)
Rn spoke with Andee Poles in referrals. PEr Andee Poles the CT scan does not require a PA. The wife had been communicating with [facility]. The daughter was given number to call to schedule for her dad for the scan next week.

## 2015-09-29 NOTE — Telephone Encounter (Signed)
Hi, Katrina, I have ordered CT scan on 09/22/15 to be done on 10/06/15. Please do the PA if needed. Thanks.   Rosalin Hawking, MD PhD Stroke Neurology 09/29/2015 12:54 PM

## 2015-09-29 NOTE — Telephone Encounter (Signed)
Spoke with the patients daughter who stated that [facility] told her they were awaiting a PA from our office to schedule. I told her that the order notes in the system said [facility] had tried calling the patient and spoke with his wife who told them to call back to schedule in two weeks. I called [facility] who said they had called the patient to schedule twice but that the patient requested a call back later. Then they told me that referral requested [facility], so I call [facility] to make sure they had received the referral and they stated that they had also called the patient and his wife had requested a call back later. Called the daughter to inform her and give her the number to [facility].

## 2015-09-30 ENCOUNTER — Encounter (HOSPITAL_COMMUNITY): Payer: Self-pay

## 2015-10-04 ENCOUNTER — Ambulatory Visit (HOSPITAL_COMMUNITY)
Admission: RE | Admit: 2015-10-04 | Discharge: 2015-10-04 | Disposition: A | Discharge status: Home / Self Care | Payer: Medicare Other | Source: Ambulatory Visit | Attending: Neurology | Admitting: Neurology

## 2015-10-04 DIAGNOSIS — I611 Nontraumatic intracerebral hemorrhage in hemisphere, cortical: Secondary | ICD-10-CM | POA: Diagnosis not present

## 2015-10-04 DIAGNOSIS — G939 Disorder of brain, unspecified: Secondary | ICD-10-CM | POA: Diagnosis not present

## 2015-10-04 MED ORDER — IOPAMIDOL (ISOVUE-300) INJECTION 61%
75.0000 mL | Freq: Once | INTRAVENOUS | Status: AC | PRN
  Administered 2015-10-04: 75 mL via INTRAVENOUS

## 2015-10-05 ENCOUNTER — Other Ambulatory Visit: Payer: Self-pay | Admitting: *Deleted

## 2015-10-05 ENCOUNTER — Telehealth: Payer: Self-pay | Admitting: Neurology

## 2015-10-05 NOTE — Telephone Encounter (Signed)
Jody with [facility] is calling to let Dr. Erlinda Hong know the Information CT/head/with/without contrast is ready to be viewed.

## 2015-10-05 NOTE — Telephone Encounter (Signed)
Rn call patients daughter to give her fathers Ct scan results. Rn told David [name] that tell her that her father's CT showed likely solitary melanoma metastasis at left frontal in the center of previous bleeding. Please let her discuss with pt's oncologist for further action. Thanks. This is per Dr. Erlinda Hong note. [name] verbalized understanding. Pt will be seeing a [facility] and they can see the Ct scan in epic.

## 2015-10-06 ENCOUNTER — Ambulatory Visit (HOSPITAL_BASED_OUTPATIENT_CLINIC_OR_DEPARTMENT_OTHER): Payer: Medicare Other | Admitting: Hematology

## 2015-10-06 ENCOUNTER — Encounter: Payer: Self-pay | Admitting: Hematology

## 2015-10-06 ENCOUNTER — Other Ambulatory Visit (HOSPITAL_BASED_OUTPATIENT_CLINIC_OR_DEPARTMENT_OTHER): Payer: Medicare Other

## 2015-10-06 ENCOUNTER — Ambulatory Visit (HOSPITAL_BASED_OUTPATIENT_CLINIC_OR_DEPARTMENT_OTHER): Payer: Medicare Other

## 2015-10-06 ENCOUNTER — Telehealth: Payer: Self-pay | Admitting: Hematology

## 2015-10-06 VITALS — BP 139/87 | HR 77 | Temp 98.0°F | Resp 18 | Ht 69.0 in | Wt 204.6 lb

## 2015-10-06 DIAGNOSIS — I10 Essential (primary) hypertension: Secondary | ICD-10-CM

## 2015-10-06 DIAGNOSIS — C7800 Secondary malignant neoplasm of unspecified lung: Secondary | ICD-10-CM

## 2015-10-06 DIAGNOSIS — C439 Malignant melanoma of skin, unspecified: Secondary | ICD-10-CM

## 2015-10-06 DIAGNOSIS — C7931 Secondary malignant neoplasm of brain: Secondary | ICD-10-CM | POA: Diagnosis not present

## 2015-10-06 DIAGNOSIS — I619 Nontraumatic intracerebral hemorrhage, unspecified: Secondary | ICD-10-CM

## 2015-10-06 DIAGNOSIS — I629 Nontraumatic intracranial hemorrhage, unspecified: Secondary | ICD-10-CM | POA: Diagnosis not present

## 2015-10-06 DIAGNOSIS — Z5112 Encounter for antineoplastic immunotherapy: Secondary | ICD-10-CM | POA: Diagnosis present

## 2015-10-06 DIAGNOSIS — C799 Secondary malignant neoplasm of unspecified site: Secondary | ICD-10-CM

## 2015-10-06 DIAGNOSIS — C770 Secondary and unspecified malignant neoplasm of lymph nodes of head, face and neck: Secondary | ICD-10-CM | POA: Diagnosis not present

## 2015-10-06 DIAGNOSIS — C7951 Secondary malignant neoplasm of bone: Secondary | ICD-10-CM

## 2015-10-06 LAB — COMPREHENSIVE METABOLIC PANEL
ALT: 28 U/L (ref 0–55)
ANION GAP: 11 meq/L (ref 3–11)
AST: 29 U/L (ref 5–34)
Albumin: 3.4 g/dL — ABNORMAL LOW (ref 3.5–5.0)
Alkaline Phosphatase: 77 U/L (ref 40–150)
BUN: 33.7 mg/dL — ABNORMAL HIGH (ref 7.0–26.0)
CALCIUM: 9.6 mg/dL (ref 8.4–10.4)
CHLORIDE: 105 meq/L (ref 98–109)
CO2: 22 mEq/L (ref 22–29)
Creatinine: 1.1 mg/dL (ref 0.7–1.3)
EGFR: 64 mL/min/{1.73_m2} — ABNORMAL LOW (ref >90)
Glucose: 103 mg/dl (ref 70–140)
POTASSIUM: 4.5 meq/L (ref 3.5–5.1)
Sodium: 138 mEq/L (ref 136–145)
Total Bilirubin: <0.3 mg/dL (ref 0.20–1.20)
Total Protein: 7.3 g/dL (ref 6.4–8.3)

## 2015-10-06 LAB — CBC WITH DIFFERENTIAL/PLATELET
BASO%: 0.1 % (ref 0.0–2.0)
BASOS ABS: 0 10*3/uL (ref 0.0–0.1)
EOS ABS: 0.2 10*3/uL (ref 0.0–0.5)
EOS%: 3.3 % (ref 0.0–7.0)
HEMATOCRIT: 39.3 % (ref 38.4–49.9)
HGB: 13.6 g/dL (ref 13.0–17.1)
LYMPH%: 8.5 % — AB (ref 14.0–49.0)
MCH: 31.5 pg (ref 27.2–33.4)
MCHC: 34.6 g/dL (ref 32.0–36.0)
MCV: 91 fL (ref 79.3–98.0)
MONO#: 0.4 10*3/uL (ref 0.1–0.9)
MONO%: 5.9 % (ref 0.0–14.0)
NEUT#: 6 10*3/uL (ref 1.5–6.5)
NEUT%: 82.2 % — ABNORMAL HIGH (ref 39.0–75.0)
PLATELETS: 237 10*3/uL (ref 140–400)
RBC: 4.32 10*6/uL (ref 4.20–5.82)
RDW: 14.8 % — ABNORMAL HIGH (ref 11.0–14.6)
WBC: 7.3 10*3/uL (ref 4.0–10.3)
lymph#: 0.6 10*3/uL — ABNORMAL LOW (ref 0.9–3.3)

## 2015-10-06 LAB — TSH: TSH: 3.38 m(IU)/L (ref 0.320–4.118)

## 2015-10-06 MED ORDER — SODIUM CHLORIDE 0.9 % IV SOLN
Freq: Once | INTRAVENOUS | Status: AC
  Administered 2015-10-06: 15:00:00 via INTRAVENOUS

## 2015-10-06 MED ORDER — SODIUM CHLORIDE 0.9 % IV SOLN
240.0000 mg | Freq: Once | INTRAVENOUS | Status: AC
  Administered 2015-10-06: 240 mg via INTRAVENOUS
  Filled 2015-10-06: qty 20

## 2015-10-06 NOTE — Patient Instructions (Addendum)
[facility] Discharge Instructions for Patients Receiving Chemotherapy  Today you received the following chemotherapy agents:  Nivolumab  To help prevent nausea and vomiting after your treatment, we encourage you to take your nausea medication as prescribed.   If you develop nausea and vomiting that is not controlled by your nausea medication, call the clinic.   BELOW ARE SYMPTOMS THAT SHOULD BE REPORTED IMMEDIATELY:  *FEVER GREATER THAN 100.5 F  *CHILLS WITH OR WITHOUT FEVER  NAUSEA AND VOMITING THAT IS NOT CONTROLLED WITH YOUR NAUSEA MEDICATION  *UNUSUAL SHORTNESS OF BREATH  *UNUSUAL BRUISING OR BLEEDING  TENDERNESS IN MOUTH AND THROAT WITH OR WITHOUT PRESENCE OF ULCERS  *URINARY PROBLEMS  *BOWEL PROBLEMS  UNUSUAL RASH Items with * indicate a potential emergency and should be followed up as soon as possible.  Feel free to call the clinic you have any questions or concerns. The clinic phone number is [phone].  Please show the CHEMO ALERT CARD at check-in to the Emergency Department and triage nurse.    Nivolumab injection What is this medicine? NIVOLUMAB (nye VOL ue mab) is a monoclonal antibody. It is used to treat melanoma, lung cancer, kidney cancer, and Hodgkin lymphoma. This medicine may be used for other purposes; ask your health care provider or pharmacist if you have questions. What should I tell my health care provider before I take this medicine? They need to know if you have any of these conditions: -diabetes -immune system problems -kidney disease -liver disease -lung disease -organ transplant -stomach or intestine problems -thyroid disease -an unusual or allergic reaction to nivolumab, other medicines, foods, dyes, or preservatives -pregnant or trying to get pregnant -breast-feeding How should I use this medicine? This medicine is for infusion into a vein. It is given by a health care professional in a hospital or clinic  setting. A special MedGuide will be given to you before each treatment. Be sure to read this information carefully each time. Talk to your pediatrician regarding the use of this medicine in children. Special care may be needed. Overdosage: If you think you have taken too much of this medicine contact a poison control center or emergency room at once. NOTE: This medicine is only for you. Do not share this medicine with others. What if I miss a dose? It is important not to miss your dose. Call your doctor or health care professional if you are unable to keep an appointment. What may interact with this medicine? Interactions have not been studied. Give your health care provider a list of all the medicines, herbs, non-prescription drugs, or dietary supplements you use. Also tell them if you smoke, drink alcohol, or use illegal drugs. Some items may interact with your medicine. This list may not describe all possible interactions. Give your health care provider a list of all the medicines, herbs, non-prescription drugs, or dietary supplements you use. Also tell them if you smoke, drink alcohol, or use illegal drugs. Some items may interact with your medicine. What should I watch for while using this medicine? This drug may make you feel generally unwell. Continue your course of treatment even though you feel ill unless your doctor tells you to stop. You may need blood work done while you are taking this medicine. Do not become pregnant while taking this medicine or for 5 months after stopping it. Women should inform their doctor if they wish to become pregnant or think they might be pregnant. There is a potential for serious side effects   to an unborn child. Talk to your health care professional or pharmacist for more information. Do not breast-feed an infant while taking this medicine. What side effects may I notice from receiving this medicine? Side effects that you should report to your doctor or health  care professional as soon as possible: -allergic reactions like skin rash, itching or hives, swelling of the face, lips, or tongue -black, tarry stools -blood in the urine -bloody or watery diarrhea -changes in vision -change in sex drive -changes in emotions or moods -chest pain -confusion -cough -decreased appetite -diarrhea -facial flushing -feeling faint or lightheaded -fever, chills -hair loss -hallucination, loss of contact with reality -headache -irritable -joint pain -loss of memory -muscle pain -muscle weakness -seizures -shortness of breath -signs and symptoms of high blood sugar such as dizziness; dry mouth; dry skin; fruity breath; nausea; stomach pain; increased hunger or thirst; increased urination -signs and symptoms of kidney injury like trouble passing urine or change in the amount of urine -signs and symptoms of liver injury like dark yellow or brown urine; general ill feeling or flu-like symptoms; light-colored stools; loss of appetite; nausea; right upper belly pain; unusually weak or tired; yellowing of the eyes or skin -stiff neck -swelling of the ankles, feet, hands -weight gain Side effects that usually do not require medical attention (report to your doctor or health care professional if they continue or are bothersome): -bone pain -constipation -tiredness -vomiting This list may not describe all possible side effects. Call your doctor for medical advice about side effects. You may report side effects to FDA at 1-800-FDA-1088. Where should I keep my medicine? This drug is given in a hospital or clinic and will not be stored at home. NOTE: This sheet is a summary. It may not cover all possible information. If you have questions about this medicine, talk to your doctor, pharmacist, or health care provider.    2016, Elsevier/Gold Standard. (2014-08-26 10:03:42)  

## 2015-10-06 NOTE — Telephone Encounter (Signed)
Gave pt cal & avs °

## 2015-10-06 NOTE — Progress Notes (Signed)
[place]  [phone] [phone]  Clinic Follow up Note   Patient Care Team: [name], MD as PCP - General (Internal Medicine) 10/06/2015  Chief complaint: Follow-up metastatic melanoma  Oncology History   Metastatic melanoma [facility])   Staging form: Melanoma of the Skin, AJCC 7th Edition     Clinical stage from 09/21/2015: Stage IV [place], [place], [place]) - Signed by [name], MD on 10/09/2015       Metastatic melanoma ([place])   09/16/2015 Imaging PET scan showed a hypermetabolic 2.5EN lingular nodule, and a 70m right upper lobe lung nodule, left subclavicular nodes, and bone metastasis in the proximal right humerus and right femur.    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 He was found to have left frontal hemorrhage 5.7 cm x 4.5 cm with 6 mm left to right shift.During the workup for his stroke, a left upper lung mass was noticed on the CT scan. I was called to evaluate his lung mass.  Patient has aphasia, not able to communicate much. According to his daughter, Dr. LeGala Romneywho is a OB GYN at [employer] probably only smoked for very short period time during the college. He did have 3 skin melanoma, which was removed, last one was 2-3 years ago. No other cancer history.  CURRENT THERAPY: Nivolumab 240 mg, every 2 weeks, starting  10/06/2015  INTERVAL HISTORY: Mr. HaSesayeturns for follow-up. He is accompanied by his daughter kelly. He has been recovering well from his stroke, he was discharged from the rehabilitation this morning, and lives in the assistant living with his wife now. He denies any significant weakness, walks normally without any assistance. He is more physically active now. His speech is fluent, memory has improved, but does not remember what happened with each he was in the hospital after stroke. His appetite and energy level are decent. He denies any significant pain cough or dyspnea.    REVIEW OF SYSTEMS:   Constitutional: Denies fevers, chills or abnormal weight loss Eyes: Denies blurriness of vision Ears, nose, mouth, throat, and face: Denies mucositis or sore throat Respiratory: Denies cough, dyspnea or wheezes Cardiovascular: Denies palpitation, chest discomfort or lower extremity swelling Gastrointestinal:  Denies nausea, heartburn or change in bowel habits Skin: Denies abnormal skin rashes Lymphatics: Denies new lymphadenopathy or easy bruising Neurological:see HPI  Behavioral/Psych: Mood is stable, no new changes  All other systems were reviewed with the patient and are negative.  MEDICAL HISTORY:  Past Medical History  Diagnosis Date  . Hypertension   . Sleep apnea     uses CPAP  . High cholesterol   . Renal disorder   . Pacemaker     due to syncope  . Rectal bleed     due to NSAIDS  . [place]  . Skin cancer   . [facility]    SURGICAL HISTORY: Past  Surgical History  Procedure Laterality Date  . Cardiac surgery      bypass X 2  . Knee arthroscopy  12/2008  . Foot surgery  08/2010    I have reviewed the social history and family history with the patient and they are unchanged from previous note.  ALLERGIES:  is allergic to zocor.  MEDICATIONS:  Current Outpatient Prescriptions  Medication Sig Dispense Refill  . acetaminophen (TYLENOL) 325 MG  tablet Take 2 tablets (650 mg total) by mouth every 6 (six) hours as needed for mild pain.    . carvedilol (COREG) 3.125 MG tablet Take 3.125 mg by mouth 2 (two) times daily with a meal.    . levothyroxine (SYNTHROID, LEVOTHROID) 125 MCG tablet Take 125 mcg by mouth daily before breakfast.    . Multiple Vitamin (MULTIVITAMIN WITH MINERALS) TABS tablet Take 1 tablet by mouth daily.    Marland Kitchen PRESCRIPTION MEDICATION Inhale into the lungs at bedtime. CPAP    . rosuvastatin (CRESTOR) 10 MG tablet Take 10 mg by mouth at bedtime.    . selenium sulfide (SELSUN) 1 % LOTN Apply 1 application topically daily. To head and face and rinse off    . senna-docusate (SENOKOT-S) 8.6-50 MG tablet Take 1 tablet by mouth 2 (two) times daily.    . sertraline (ZOLOFT) 50 MG tablet Take 2.5 tablets (125 mg total) by mouth daily.    . Turmeric Curcumin 500 MG CAPS Take 500 mg by mouth 2 (two) times daily.     No current facility-administered medications for this visit.    PHYSICAL EXAMINATION: ECOG PERFORMANCE STATUS: 2 - Symptomatic, <50% confined to bed  Filed Vitals:   10/06/15 1340  BP: 139/87  Pulse: 77  Temp: 98 F (36.7 C)  Resp: 18   Filed Weights   10/06/15 1340  Weight: 204 lb 9.6 oz (92.806 kg)    GENERAL:alert, no distress and comfortable SKIN: skin color, texture, turgor are normal, no rashes or significant lesions EYES: normal, Conjunctiva are pink and non-injected, sclera clear OROPHARYNX:no exudate, no erythema and lips, buccal mucosa, and tongue normal  NECK: supple, thyroid normal size, non-tender, without nodularity LYMPH:  no palpable lymphadenopathy in the cervical, [place], axillary or inguinal LUNGS: clear to auscultation and percussion with normal breathing effort HEART: regular rate & rhythm and no murmurs and no lower extremity edema ABDOMEN:abdomen soft, non-tender and normal bowel sounds Musculoskeletal:no cyanosis of digits and no clubbing  NEURO: alert & oriented x 3 with fluent  speech, no focal motor/sensory deficits  LABORATORY DATA:  I have reviewed the data as listed CBC Latest Ref Rng 10/06/2015 09/21/2015 09/04/2015  WBC 4.0 - 10.3 10e3/uL 7.3 5.2 8.6  Hemoglobin 13.0 - 17.1 g/dL 13.6 13.5 12.8(L)  Hematocrit 38.4 - 49.9 % 39.3 37.9(L) 37.5(L)  Platelets 140 - 400 10e3/uL 237 240 344     CMP Latest Ref Rng 10/06/2015 09/04/2015 09/03/2015  Glucose 70 - 140 mg/dl 103 100(H) 92  BUN 7.0 - 26.0 mg/dL 33.7(H) 31(H) 36(H)  Creatinine 0.7 - 1.3 mg/dL 1.1 1.08 1.26(H)  Sodium 136 - 145 mEq/L 138 136 137  Potassium 3.5 - 5.1 mEq/L 4.5 4.4 4.4  Chloride 101 - 111 mmol/L - 104 106  CO2 22 - 29 mEq/L 22 24 22   Calcium 8.4 - 10.4 mg/dL 9.6 9.0 8.8(L)  Total Protein 6.4 - 8.3 g/dL 7.3 - -  Total Bilirubin 0.20 - 1.20 mg/dL <0.30 - -  Alkaline Phos 40 - 150 U/L 77 - -  AST 5 - 34 U/L 29 - -  ALT 0 - 55 U/L 28 - -   PATHOLOGY REPORT: Diagnosis 09/21/2015 Lymph node, needle/core biopsy, hypermetabolic left sub clavicular LN - METASTATIC MALIGNANT MELANOMA. - SEE COMMENT. Microscopic Comment The case was discussed with Dr. Alen Blew on 09/22/15. Additional studies can be performed upon clinician request. (JBK:gt, 09/22/15)    RADIOGRAPHIC STUDIES: I have personally reviewed the radiological images as listed and agreed with the findings in the report. Ct Head W Wo Contrast  10/04/2015  CLINICAL DATA:  Continued surveillance LEFT frontal hemorrhage. Biopsy of a LEFT neck lymph node yielded melanoma. Pacemaker. EXAM: CT HEAD WITHOUT AND WITH CONTRAST TECHNIQUE: Contiguous axial images were obtained from the base of the skull through the vertex without and with intravenous contrast CONTRAST:  35m ISOVUE-300 IOPAMIDOL (ISOVUE-300) INJECTION 61% COMPARISON:  Multiple priors, most recent CT head 08/26/2015. FINDINGS: Generalized atrophy. Hypoattenuation of white matter representing chronic microvascular ischemic change. No hydrocephalus or extra-axial fluid. Residual hyperdense  focus in the LEFT frontal lobe, with considerable improvement since previous CT scans. See image 19 series 2. Post infusion, there is variable, but definite enhancement concerning for a solid mass, surrounded by cystic change; see for instance image 18 series 3. There is some enhancement of the cystic change into the parenchyma which could represent granulation tissue. The nodular area of enhancement, measuring 20 x 16 x 17 mm, is favored to represent a melanoma metastasis. No other similar lesions. No midline shift. Mild surrounding vasogenic edema is suspected, extending to the centrum semiovale as well as the frontal lobe, all on the LEFT. Cortical lucency, LEFT parietal bone, not clearly representing a venous [place]. See image 27 series 4. No skull base lesions. No sinus or mastoid disease. BILATERAL cataract extraction with LEFT globe banding. IMPRESSION: 20 x 16 x 17 mm superficial LEFT frontal hyperdense lesion, showing postcontrast enhancement, lying within the epicenter of the previous hemorrhage, concerning for a solitary melanoma metastasis. Interval resolution of the previous intraparenchymal hemorrhage, but residual vasogenic edema may be secondary to the enhancing tumor. Peripheral enhancement surrounding the hypoattenuating cavity occupied previously by the hematoma, is favored to represent granulation tissue, rather than tumor. These results will be called to the ordering clinician or representative by the Radiologist Assistant, and communication documented in the PACS or zVision Dashboard. Electronically Signed   By: JStaci RighterM.D.   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 He was incidentally found to have a 2.8 cm mass in the left upper lobe of lung.  1. Metastatic melanoma to lung, node, bone and brain, BRAF  V600K mutation (+) -I previously  reviewed his PET/CT scan images with pt and his family members. The scan revealed hypermetabolic left upper lobe lung mass, 2 small right lung nodules, probable bone metastasis, and a subcutaneous left supraclavicular lesion, which is not palpable  -His repeat his CT had showed a 2.0 cm left frontal hyperdense lesion with enhancement on contrast, in the area of his recent hemorrhagic stroke, highly suspicious for brain metastasis.  -We discussed his left supraclavicular node biopsy results, which showed metastatic melanoma.. -His case was discussed in our sarcoma tumor board last week.  -I recommend a first-line treatment with Nivolumab alone, given his recent stroke, low tumor burden. I'll reserve. Of Proteus therapy with or without MEK inhibitor  as a second line therapy if he progress on immunotherapy. Other options of immunotherapy, especially checkpoint inhibitor with ipilumimab, were also discussed with pt and his daughter --Chemotherapy consent: Side effects including but does not not limited to, fatigue, nausea, vomiting, diarrhea, skin rash, thyroid dysfunction, other endocrine disorders, pneumonitis, etc, were discussed with patient in great detail. He agrees to proceed. -Lab results reviewed, adequate for treatment, we'll start first cycle Nivolumab today and continue every 2 weeks.   2. Hemorrhagic stroke and brain metastasis  -I encouraged him to continue physical therapy and occupational therapy. He has recovered  very well -I'll refer him to radiation oncologist Dr. Tammi Klippel to consider SBRT for the solitary brain mets. He would need a brain MRI to rule out other small brain mets   Plan -First cycle Nivolumab today, and we'll continue every 2 weeks  -rad/onc referral to Dr. Tammi Klippel for SBRT to his brain met  -I'll see him back in 2 weeks   All questions were answered. The patient knows to call the clinic with any problems, questions or concerns. No  barriers to learning was detected.  I spent 30 minutes counseling the patient face to face. The total time spent in the appointment was 35 minutes and more than 50% was on counseling and review of test results     [name], MD 10/06/2015

## 2015-10-07 ENCOUNTER — Telehealth: Payer: Self-pay | Admitting: *Deleted

## 2015-10-07 ENCOUNTER — Other Ambulatory Visit (HOSPITAL_COMMUNITY): Payer: Self-pay | Admitting: Neurosurgery

## 2015-10-07 ENCOUNTER — Other Ambulatory Visit: Payer: Self-pay | Admitting: Neurosurgery

## 2015-10-07 ENCOUNTER — Inpatient Hospital Stay: Payer: Medicare Other | Admitting: Emergency Medicine

## 2015-10-07 DIAGNOSIS — D332 Benign neoplasm of brain, unspecified: Secondary | ICD-10-CM

## 2015-10-07 NOTE — Telephone Encounter (Signed)
Called daughter & left vm to call back re how pt did with chemo yest.   She called back & left message that he did well with immunotherapy & she will cont to check on him.

## 2015-10-07 NOTE — Telephone Encounter (Signed)
-----   Message from Jarvis Morgan, RN sent at 10/06/2015  6:52 PM EDT ----- Regarding: Chemo follow up First time Nivolumab.  Dr. Evonnie Pat, TB, RN.

## 2015-10-08 ENCOUNTER — Ambulatory Visit
Admission: RE | Admit: 2015-10-08 | Discharge: 2015-10-08 | Disposition: A | Discharge status: Home / Self Care | Payer: Medicare Other | Source: Ambulatory Visit | Attending: Radiation Oncology | Admitting: Radiation Oncology

## 2015-10-08 ENCOUNTER — Encounter: Payer: Self-pay | Admitting: Radiation Oncology

## 2015-10-08 ENCOUNTER — Inpatient Hospital Stay: Payer: Medicare Other | Admitting: Hematology

## 2015-10-08 VITALS — BP 112/65 | HR 75 | Resp 16 | Ht 69.0 in | Wt 209.4 lb

## 2015-10-08 DIAGNOSIS — C7931 Secondary malignant neoplasm of brain: Secondary | ICD-10-CM | POA: Diagnosis not present

## 2015-10-08 DIAGNOSIS — C799 Secondary malignant neoplasm of unspecified site: Secondary | ICD-10-CM

## 2015-10-08 DIAGNOSIS — Z510 Encounter for antineoplastic radiation therapy: Secondary | ICD-10-CM | POA: Diagnosis present

## 2015-10-08 DIAGNOSIS — G4730 Sleep apnea, unspecified: Secondary | ICD-10-CM | POA: Diagnosis not present

## 2015-10-08 DIAGNOSIS — I10 Essential (primary) hypertension: Secondary | ICD-10-CM | POA: Diagnosis not present

## 2015-10-08 DIAGNOSIS — C7949 Secondary malignant neoplasm of other parts of nervous system: Principal | ICD-10-CM

## 2015-10-08 DIAGNOSIS — R4701 Aphasia: Secondary | ICD-10-CM | POA: Insufficient documentation

## 2015-10-08 DIAGNOSIS — E7800 Pure hypercholesterolemia, unspecified: Secondary | ICD-10-CM | POA: Diagnosis not present

## 2015-10-08 DIAGNOSIS — I252 Old myocardial infarction: Secondary | ICD-10-CM | POA: Diagnosis not present

## 2015-10-08 DIAGNOSIS — R32 Unspecified urinary incontinence: Secondary | ICD-10-CM | POA: Diagnosis not present

## 2015-10-08 DIAGNOSIS — C439 Malignant melanoma of skin, unspecified: Secondary | ICD-10-CM | POA: Diagnosis present

## 2015-10-08 HISTORY — DX: Malignant neoplasm of brain, unspecified: C71.9

## 2015-10-08 NOTE — Progress Notes (Signed)
Location/Histology of Brain Tumor: metastatic malignant melanoma with 20 x 16 x 17 mm superficial left frontal lesion  Patient presented with symptoms of:  Of expressive difficulty on 08/22/15  Past or anticipated interventions, if any, per neurosurgery: no  Past or anticipated interventions, if any, per medical oncology: consulted on 10/06/15 and referred for tissue biopsy  Dose of Decadron, if applicable: no  Recent neurologic symptoms, if any:   Seizures: no  Headaches: no  Nausea: no  Dizziness/ataxia: no  Difficulty with hand coordination: no  Focal numbness/weakness: yes  Visual deficits/changes: diminished but, not diplopia   Confusion/Memory deficits: denies  Painful bone metastases at present, if any: right proximal humerus and right femur. Reports persistent right low back pain just above belt line failed to score pain.  SAFETY ISSUES:  Prior radiation? no  Pacemaker/ICD? yes  Possible current pregnancy? no  Is the patient on methotrexate? 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 Married. Daughter, Dr. [name], at [employer] [facility]. Weakened. Ambulating independently just slower.

## 2015-10-08 NOTE — Progress Notes (Signed)
See progress note under physician encounter. 

## 2015-10-09 ENCOUNTER — Encounter: Payer: Self-pay | Admitting: Radiation Oncology

## 2015-10-09 NOTE — Progress Notes (Addendum)
Radiation Oncology         [phone] ________________________________  Initial outpatient Consultation  Name: David [name] MRN: [id number]  Date: 10/08/2015  DOB: March 27, 1941  AJ:GOTLXB, [name], MD  [name], MD   REFERRING PHYSICIAN: [name], MD  DIAGNOSIS: The encounter diagnosis was Metastatic melanoma ([place]).    ICD-9-CM ICD-10-CM   1. Metastatic melanoma (HCC) 172.9 C79.9     HISTORY OF PRESENT ILLNESS: David [name] is a 75 y.o. male seen at the request of Dr. Burr Medico for a newly noted metastatic melanoma to the left chest, left supraclavicular nodes, and left frontal lobe. The patient was found to have melanoma on three occasions, the most recent was resected from his right leg about 2-3 years ago, and the first time this was seen was about 20 years ago along the right neck. He reports that he has not required any systemic therapy in the past.   On 08/22/15 he presented with aphasia to the ED and a 5.7 x 4.5 cm left frontal acute hemorrhage was noted. A 6 mm left to right shift was seen. He did have a Chest X-Ray during that time that was suspicious for a mass in the LLL. A CT chest was performed on 09/01/15 and a 2.8x2.5 x2.1 cm mass in the left upper lobe, and a 10m nodule posterior laterally in the RUL was noted. No adenopathy was noted. A PET on 09/16/15 revealed hypermetabolic activity in the LLL lesion, low SUV of 2 in the 665mnodule in the RUL, a 73m15moft tissue nodule in the left clavical region and a extrapleural lesion int he mid right thorax without metabolic change. An 8mm44mdule in the left paraspinous musculature with hypermetabolic change was also seen. Hypermetabolic change in the right thyroid consistent with lesion was also seen. A biopsy of the left supraclavicular region revealed metastatic melanoma.   A follow up CT on 08/26/15 was performed due to an episode of incontinence. Which revealed 2 x 1.6 x 1.7 cm left frontal hyperdense lesion no additional lesion was  noted. He has begun systemic Keytruda on 10/06/15, and his tumor was also tested for BRAF mutation and is positive which will guide future therapy. He is scheduled to meet with Dermatology next week, and to meet Dr. SterVertell LimberMonday. He comes to discuss the options for radiotherapy with Dr. MannTammi Klippel PREVIOUS RADIATION THERAPY: No  PAST MEDICAL HISTORY:  Past Medical History  Diagnosis Date  . Hypertension   . Sleep apnea     uses CPAP  . High cholesterol   . Renal disorder   . Pacemaker     due to syncope  . Rectal bleed     due to NSAIDS  . Melanoma (HCC)David [name]. Myocardial infarction (HCC)David [name]. Skin cancer     melanoma  . Brain cancer (HCC)David [name]  melanoma with brain met      PAST SURGICAL HISTORY: Past Surgical History  Procedure Laterality Date  . Cardiac surgery      bypass X 2  . Knee arthroscopy  12/2008  . Foot surgery  08/2010  . Lymph node biopsy    . Pacemaker insertion      FAMILY HISTORY:  Family History  Problem Relation Age of Onset  . Cancer Mother 78  68lung   . Hypertension Mother   . Hypertension Father   . Heart attack Father   . Rheum arthritis Sister  SOCIAL HISTORY:  Social History   Social History  . Marital Status: Married    Spouse Name: N/A  . Number of Children: N/A  . Years of Education: N/A   Occupational History  . Not on file.   Social History Main Topics  . Smoking status: Former Smoker -- 1.00 packs/day for 3 years    Quit date: 04/11/1919  . Smokeless tobacco: Never Used  . Alcohol Use: 2.4 oz/week    4 Glasses of wine per week     Comment: daily for the past 10 years   . Drug Use: No  . Sexual Activity: No   Other Topics Concern  . Not on file   Social History Narrative  The patient is a retired Pharmacist, community. He lives in [place] and is married. His wife suffers from dementia and they live in an assisted facility but still in a more independent setting. His daughter accompanies him and is an OB/GYN here in  [employer].  ALLERGIES: Zocor  MEDICATIONS:  Current Outpatient Prescriptions  Medication Sig Dispense Refill  . acetaminophen (TYLENOL) 325 MG tablet Take 2 tablets (650 mg total) by mouth every 6 (six) hours as needed for mild pain.    . carvedilol (COREG) 3.125 MG tablet Take 3.125 mg by mouth 2 (two) times daily with a meal.    . levothyroxine (SYNTHROID, LEVOTHROID) 125 MCG tablet Take 125 mcg by mouth daily before breakfast.    . Multiple Vitamin (MULTIVITAMIN WITH MINERALS) TABS tablet Take 1 tablet by mouth daily.    Marland Kitchen PRESCRIPTION MEDICATION Inhale into the lungs at bedtime. CPAP    . rosuvastatin (CRESTOR) 10 MG tablet Take 10 mg by mouth at bedtime.    . selenium sulfide (SELSUN) 1 % LOTN Apply 1 application topically daily. To head and face and rinse off    . senna-docusate (SENOKOT-S) 8.6-50 MG tablet Take 1 tablet by mouth 2 (two) times daily.    . sertraline (ZOLOFT) 50 MG tablet Take 2.5 tablets (125 mg total) by mouth daily.    . Turmeric Curcumin 500 MG CAPS Take 500 mg by mouth 2 (two) times daily.     No current facility-administered medications for this encounter.    REVIEW OF SYSTEMS:  On review of systems, the patient reports that he is doing well overall. His speech has returned, and he denies any headaches, but notes weakness that is generalized and visual acuity has diminished a bit, but he denies any diplopia. 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 No other complaints are verbalized.   PHYSICAL EXAM:  BP 112/65 mmHg  Pulse 75  Resp 16  Ht 5' 9"  (1.753 m)  Wt 209 lb 6.4 oz (94.983 kg)  BMI 30.91 kg/m2  SpO2 100% In general this is a well appearing caucasian male in no acute distress. He is alert and oriented x4 and appropriate throughout the examination. HEENT reveals that the patient is normocephalic, atraumatic. Skin is intact without any evidence of gross lesions of the face or upper  extremities. Cardiopulmonary assessment is negative for acute distress and he exhibits normal effort.    KPS = 80  100 - Normal; no complaints; no evidence of disease. 90   - Able to carry on normal activity; minor signs or symptoms of disease. 80   - Normal activity with effort; some signs or symptoms of disease. 51   - Cares for self; unable to carry on  normal activity or to do active work. 60   - Requires occasional assistance, but is able to care for most of his personal needs. 50   - Requires considerable assistance and frequent medical care. 65   - Disabled; requires special care and assistance. 1   - Severely disabled; hospital admission is indicated although death not imminent. 63   - Very sick; hospital admission necessary; active supportive treatment necessary. 10   - Moribund; fatal processes progressing rapidly. 0     - Dead  Karnofsky DA, Abelmann [place], Craver LS and Burchenal [facility] [phone]) The use of the nitrogen mustards in the palliative treatment of carcinoma: with particular reference to bronchogenic carcinoma Cancer 1 634-56  LABORATORY DATA:  Lab Results  Component Value Date   WBC 7.3 10/06/2015   HGB 13.6 10/06/2015   HCT 39.3 10/06/2015   MCV 91.0 10/06/2015   PLT 237 10/06/2015   Lab Results  Component Value Date   NA 138 10/06/2015   K 4.5 10/06/2015   CL 104 09/04/2015   CO2 22 10/06/2015   Lab Results  Component Value Date   ALT 28 10/06/2015   AST 29 10/06/2015   ALKPHOS 77 10/06/2015   BILITOT <0.30 10/06/2015     RADIOGRAPHY: Ct Head W Wo Contrast  10/04/2015  CLINICAL DATA:  Continued surveillance LEFT frontal hemorrhage. Biopsy of a LEFT neck lymph node yielded melanoma. Pacemaker. EXAM: CT HEAD WITHOUT AND WITH CONTRAST TECHNIQUE: Contiguous axial images were obtained from the base of the skull through the vertex without and with intravenous contrast CONTRAST:  35m ISOVUE-300 IOPAMIDOL (ISOVUE-300) INJECTION 61% COMPARISON:  Multiple priors,  most recent CT head 08/26/2015. FINDINGS: Generalized atrophy. Hypoattenuation of white matter representing chronic microvascular ischemic change. No hydrocephalus or extra-axial fluid. Residual hyperdense focus in the LEFT frontal lobe, with considerable improvement since previous CT scans. See image 19 series 2. Post infusion, there is variable, but definite enhancement concerning for a solid mass, surrounded by cystic change; see for instance image 18 series 3. There is some enhancement of the cystic change into the parenchyma which could represent granulation tissue. The nodular area of enhancement, measuring 20 x 16 x 17 mm, is favored to represent a melanoma metastasis. No other similar lesions. No midline shift. Mild surrounding vasogenic edema is suspected, extending to the centrum semiovale as well as the frontal lobe, all on the LEFT. Cortical lucency, LEFT parietal bone, not clearly representing a venous [place]. See image 27 series 4. No skull base lesions. No sinus or mastoid disease. BILATERAL cataract extraction with LEFT globe banding. IMPRESSION: 20 x 16 x 17 mm superficial LEFT frontal hyperdense lesion, showing postcontrast enhancement, lying within the epicenter of the previous hemorrhage, concerning for a solitary melanoma metastasis. Interval resolution of the previous intraparenchymal hemorrhage, but residual vasogenic edema may be secondary to the enhancing tumor. Peripheral enhancement surrounding the hypoattenuating cavity occupied previously by the hematoma, is favored to represent granulation tissue, rather than tumor. These results will be called to the ordering clinician or representative by the Radiologist Assistant, and communication documented in the PACS or zVision Dashboard. Electronically Signed   By: JStaci RighterM.D.   On: 10/04/2015 17:29   Nm Pet Image Initial (pi) Skull Base To Thigh  09/16/2015  CLINICAL DATA:  Initial treatment strategy for lung mass. EXAM: NUCLEAR MEDICINE  PET SKULL BASE TO THIGH TECHNIQUE: 9.5 mCi F-18 FDG was injected intravenously. Full-ring PET imaging was performed from the skull base to thigh  after the radiotracer. CT data was obtained and used for attenuation correction and anatomic localization. FASTING BLOOD GLUCOSE:  Value: 107 mg/dl COMPARISON:  CT chest 09/01/2015. FINDINGS: NECK No hypermetabolic lung of the neck. CT images show no acute findings. CHEST Inferior lingular nodule measures 2.2 x 2.5 cm (series 8, image 42) with an SUV max of 17.7. 6 mm right upper lobe nodule (series 8, image 20) has an SUV max 2.0. 3 mm nodule along the minor fissure (image 33), too small for PET resolution. No hypermetabolic mediastinal, hilar or axillary lymph nodes. There is focal hypermetabolism in the right lobe of the thyroid, which is slightly asymmetrically enlarged with an SUV max 6.8. Faint soft tissue nodule overlying the distal left clavicle measures 9 mm (CT image 51) with an SUV max of 10.2. 1.7 cm low-attenuation extrapleural lesion in the posteromedial mid right hemi thorax (series 4, image 83), shows no abnormal hypermetabolism and may represent a neurogenic tumor. No pericardial effusion. There may be a tiny right pleural effusion. ABDOMEN/PELVIS An 8 mm nodule in or adjacent to the left paraspinous musculature at the L1-2 level measures 8 mm (series 4, image 128) with an SUV max of 6.9. Subcutaneous lesion overlying the right gluteus musculature measures 8 mm (image 170) with an SUV max of 4.0. No abnormal hypermetabolism in the liver, adrenal glands, spleen or pancreas. No hypermetabolic lymph nodes. CT images show the liver, gallbladder, adrenal glands to be unremarkable. Low-attenuation lesions in the kidneys measure up to approximately 1.7 cm and are difficult to further characterize due to size and/or lack of post-contrast imaging. Kidneys, spleen, pancreas, stomach and bowel are otherwise grossly unremarkable. SKELETON Slightly increased marrow  attenuation is seen in the proximal right humerus (series 4, images 60-70). This is best seen on soft tissue windows. Associated SUV max of 10.5. There is cortical hypermetabolism along the greater trochanter of the right femur, where there may be slight cortical thickening (series 4, image 196), SUV max 6.1. No additional abnormal osseous hypermetabolism. IMPRESSION: 1. Hypermetabolic lingular nodule with hypermetabolic right upper lobe nodule, subcutaneous nodules and osseous lesions. Distribution is not typical of primary bronchogenic carcinoma, although not excluded. Melanoma could have this appearance. Consider MR of the proximal right humerus without and with contrast in further evaluation. Percutaneous tissue sampling of lingular lesion may be useful, as clinically indicated. 2. Hypermetabolic right thyroid lesion. Ultrasound is recommended in further evaluation as malignancy cannot be excluded. Electronically Signed   By: Lorin Picket M.D.   On: 09/16/2015 10:01   US Biopsy  09/21/2015  INDICATION: History of melanoma, now with hypermetabolic left infarct follicular lymph node worrisome for metastatic disease. Please perform ultrasound-guided lymph node biopsy for tissue diagnostic purposes. EXAM: ULTRASOUND GUIDED LYMPH NODE BIOPSY COMPARISON:  PET-CT - 09/16/2015 MEDICATIONS: None ANESTHESIA/SEDATION: Moderate (conscious) sedation was employed during this procedure. A total of Versed 1.5 mg and Fentanyl 50 mcg was administered intravenously. Moderate Sedation Time: 16 minutes. The patient's level of consciousness and vital signs were monitored continuously by radiology nursing throughout the procedure under my direct supervision. COMPLICATIONS: None immediate. TECHNIQUE: Informed written consent was obtained from the patient after a discussion of the risks, benefits and alternatives to treatment. Questions regarding the procedure were encouraged and answered. Initial ultrasound scanning demonstrated  an approximately 1.4 x 0.7 cm nodule within the subcutaneous tissues inferior to the lateral aspect of the left clavicle (image 2) which correlates with the hyper metabolic left infraclavicular lymph node seen on preceding PET-CT  scan. An ultrasound image was saved for documentation purposes. The procedure was planned. A timeout was performed prior to the initiation of the procedure. The operative was prepped and draped in the usual sterile fashion, and a sterile drape was applied covering the operative field. A timeout was performed prior to the initiation of the procedure. Local anesthesia was provided with 1% lidocaine with epinephrine. Under direct ultrasound guidance, an 18 gauge core needle device was utilized to obtain to obtain 6 core needle samples of the dominant hypermetabolic left infraclavicular lymph node. The samples were placed in saline and submitted to pathology. The needle was removed and hemostasis was achieved with manual compression. Post procedure scan was negative for significant hematoma. A dressing was placed. The patient tolerated the procedure well without immediate postprocedural complication. IMPRESSION: Technically successful ultrasound guided biopsy of dominant hypermetabolic left infraclavicular lymph node. Electronically Signed   By: Sandi Mariscal M.D.   On: 09/21/2015 12:44      IMPRESSION: Metastatic melanoma to the lingula, right upper lobe,left supraclavicular node, and brain.  PLAN: Dr. Tammi Klippel discusses the pathology findings, imaging studies, reviews the recommendations to proceed with surgical resection and stereotactic radiosurgery. He discusses the sequence of preop SRS. The patient will meet with Dr. Vertell Limber next Monday and we will move forward with the planning process for [facility]. The risks, benefits, short and long term effects are reviewed with the patient. He is interested in moving forward, he will continue systemic therapy with Dr. Burr Medico on [place] as well. At the  end of the conversation all questions were answered to the satisfaction of the patient and his daughter.  The above documentation reflects my direct findings during this shared patient visit. Please see the separate note by Dr. Sharrie Rothman on this date for the remainder of the patient's plan of care.    Carola Rhine, PAC

## 2015-10-11 DIAGNOSIS — I252 Old myocardial infarction: Secondary | ICD-10-CM | POA: Diagnosis not present

## 2015-10-11 DIAGNOSIS — G4730 Sleep apnea, unspecified: Secondary | ICD-10-CM | POA: Diagnosis not present

## 2015-10-11 DIAGNOSIS — R32 Unspecified urinary incontinence: Secondary | ICD-10-CM | POA: Diagnosis not present

## 2015-10-11 DIAGNOSIS — E7800 Pure hypercholesterolemia, unspecified: Secondary | ICD-10-CM | POA: Diagnosis not present

## 2015-10-11 DIAGNOSIS — C799 Secondary malignant neoplasm of unspecified site: Secondary | ICD-10-CM | POA: Diagnosis present

## 2015-10-11 DIAGNOSIS — C439 Malignant melanoma of skin, unspecified: Secondary | ICD-10-CM | POA: Diagnosis present

## 2015-10-11 DIAGNOSIS — C7931 Secondary malignant neoplasm of brain: Secondary | ICD-10-CM | POA: Diagnosis not present

## 2015-10-11 DIAGNOSIS — R4701 Aphasia: Secondary | ICD-10-CM | POA: Diagnosis not present

## 2015-10-11 DIAGNOSIS — I10 Essential (primary) hypertension: Secondary | ICD-10-CM | POA: Diagnosis not present

## 2015-10-11 DIAGNOSIS — Z510 Encounter for antineoplastic radiation therapy: Secondary | ICD-10-CM | POA: Diagnosis present

## 2015-10-13 ENCOUNTER — Encounter: Payer: Self-pay | Admitting: Radiation Oncology

## 2015-10-13 ENCOUNTER — Ambulatory Visit
Admission: RE | Admit: 2015-10-13 | Discharge: 2015-10-13 | Disposition: A | Discharge status: Home / Self Care | Payer: Medicare Other | Source: Ambulatory Visit | Attending: Radiation Oncology | Admitting: Radiation Oncology

## 2015-10-13 ENCOUNTER — Telehealth: Payer: Self-pay | Admitting: *Deleted

## 2015-10-13 ENCOUNTER — Ambulatory Visit (HOSPITAL_COMMUNITY)
Admission: RE | Admit: 2015-10-13 | Discharge: 2015-10-13 | Disposition: A | Discharge status: Home / Self Care | Payer: Medicare Other | Source: Ambulatory Visit | Attending: Neurosurgery | Admitting: Neurosurgery

## 2015-10-13 ENCOUNTER — Encounter (HOSPITAL_COMMUNITY): Payer: Self-pay

## 2015-10-13 ENCOUNTER — Other Ambulatory Visit: Payer: Self-pay | Admitting: Radiation Oncology

## 2015-10-13 ENCOUNTER — Encounter (HOSPITAL_COMMUNITY)
Admission: RE | Admit: 2015-10-13 | Discharge: 2015-10-13 | Disposition: A | Discharge status: Home / Self Care | Payer: Medicare Other | Source: Ambulatory Visit | Attending: Neurosurgery | Admitting: Neurosurgery

## 2015-10-13 VITALS — BP 116/83 | HR 76 | Temp 97.6°F | Resp 20

## 2015-10-13 DIAGNOSIS — D332 Benign neoplasm of brain, unspecified: Secondary | ICD-10-CM

## 2015-10-13 DIAGNOSIS — C7931 Secondary malignant neoplasm of brain: Secondary | ICD-10-CM

## 2015-10-13 DIAGNOSIS — C799 Secondary malignant neoplasm of unspecified site: Secondary | ICD-10-CM

## 2015-10-13 DIAGNOSIS — R600 Localized edema: Secondary | ICD-10-CM | POA: Insufficient documentation

## 2015-10-13 DIAGNOSIS — C439 Malignant melanoma of skin, unspecified: Secondary | ICD-10-CM

## 2015-10-13 DIAGNOSIS — M899 Disorder of bone, unspecified: Secondary | ICD-10-CM | POA: Insufficient documentation

## 2015-10-13 HISTORY — DX: Gastro-esophageal reflux disease without esophagitis: K21.9

## 2015-10-13 HISTORY — DX: Atherosclerotic heart disease of native coronary artery without angina pectoris: I25.10

## 2015-10-13 HISTORY — DX: Unspecified osteoarthritis, unspecified site: M19.90

## 2015-10-13 HISTORY — DX: Angina pectoris, unspecified: I20.9

## 2015-10-13 HISTORY — DX: Cerebral infarction, unspecified: I63.9

## 2015-10-13 HISTORY — DX: Hypothyroidism, unspecified: E03.9

## 2015-10-13 LAB — CBC
HCT: 39.2 % (ref 39.0–52.0)
Hemoglobin: 13 g/dL (ref 13.0–17.0)
MCH: 30.8 pg (ref 26.0–34.0)
MCHC: 33.2 g/dL (ref 30.0–36.0)
MCV: 92.9 fL (ref 78.0–100.0)
PLATELETS: 211 10*3/uL (ref 150–400)
RBC: 4.22 MIL/uL (ref 4.22–5.81)
RDW: 15 % (ref 11.5–15.5)
WBC: 5.9 10*3/uL (ref 4.0–10.5)

## 2015-10-13 LAB — COMPREHENSIVE METABOLIC PANEL
ALBUMIN: 3.3 g/dL — AB (ref 3.5–5.0)
ALK PHOS: 55 U/L (ref 38–126)
ALT: 20 U/L (ref 17–63)
AST: 24 U/L (ref 15–41)
Anion gap: 8 (ref 5–15)
BUN: 31 mg/dL — AB (ref 6–20)
CALCIUM: 9.6 mg/dL (ref 8.9–10.3)
CHLORIDE: 107 mmol/L (ref 101–111)
CO2: 22 mmol/L (ref 22–32)
CREATININE: 1.24 mg/dL (ref 0.61–1.24)
GFR calc Af Amer: >60 mL/min (ref >60)
GFR calc non Af Amer: 55 mL/min — ABNORMAL LOW (ref >60)
GLUCOSE: 125 mg/dL — AB (ref 65–99)
Potassium: 4.6 mmol/L (ref 3.5–5.1)
SODIUM: 137 mmol/L (ref 135–145)
Total Bilirubin: 0.7 mg/dL (ref 0.3–1.2)
Total Protein: 6.7 g/dL (ref 6.5–8.1)

## 2015-10-13 LAB — TYPE AND SCREEN
ABO/RH(D): O POS
Antibody Screen: NEGATIVE

## 2015-10-13 LAB — ABO/RH: ABO/RH(D): O POS

## 2015-10-13 MED ORDER — IOPAMIDOL (ISOVUE-300) INJECTION 61%
INTRAVENOUS | Status: AC
  Filled 2015-10-13: qty 75

## 2015-10-13 MED ORDER — DEXAMETHASONE 4 MG PO TABS: 4.0000 mg | ORAL_TABLET | Freq: Two times a day (BID) | ORAL | Status: DC

## 2015-10-13 NOTE — Progress Notes (Addendum)
S/p SRS Brain, patient  Vitals wnl, stable, no fever, no sob,nausea, or light headedness,  Monitor for 30 minutes, patient knows no driving today, wife at side, offered water, declined blanket, will call for any unusual symptoms not normal for him once home, patient and wife informed Dr,. Moody to call in Dexamethasone '4mg'$  tablet to take bid, after surgery on Friday, Neurosurgeon to give patient tapered dose,verbal understanding 1:15 PM Daughter in with patient and wife, 2nd set vitals wnl, patient stated he feels about same some wierdness  Nothing changed, d/c home\1:54 PM

## 2015-10-13 NOTE — Progress Notes (Signed)
PCP is Dr. [name] Cardiologist is Dr.Krahnert- request sent for card cath, stress test and echo Echo noted in epic from 08-23-15 Sleep study done at [facility]- request sent. Encouraged pt to bring in his CPAP mask on the day of surgery

## 2015-10-13 NOTE — Pre-Procedure Instructions (Addendum)
David [name]  10/13/2015      [facility] [address], [place] - [place] AT [address] [place] [address] Phone: [phone] Fax: [phone]  [place], [place] [address] [place] [address] [place] [address] Phone: [phone] Fax: [phone]    Your procedure is scheduled on July 7  Report to [place] at 530 A.M.  Call this number if you have problems the morning of surgery:  [phone]   Remember:  Do not eat food or drink liquids after midnight.  Take these medicines the morning of surgery with A SIP OF WATER tylenol if needed, carvedilol (Coreg), levothyroxine (Synthroid), sertraline (Zoloft), Dexamethasone (Decadron)  Stop taking, Aspirin, BC's, Goody's, Herbal medications, fish oil, Ibuprofen, Motrin, Advil, Aleve, tumeric   Do not wear jewelry, make-up or nail polish.  Do not wear lotions, powders, or perfumes.  You may wear deoderant.  Do not shave 48 hours prior to surgery.  Men may shave face and neck.  Do not bring valuables to the hospital.  [facility] is not responsible for any belongings or valuables.  Contacts, dentures or bridgework may not be worn into surgery.  Leave your suitcase in the car.  After surgery it may be brought to your room.  For patients admitted to the hospital, discharge time will be determined by your treatment team.  Patients discharged the day of surgery will not be allowed to drive home.    Special instructions:  [place] - Preparing for Surgery  Before surgery, you can play an important role.  Because skin is not sterile, your skin needs to be as free of germs as possible.  You can reduce the number of germs on you skin by washing with CHG (chlorahexidine gluconate) soap before surgery.  CHG is an antiseptic cleaner which kills germs and bonds with the skin to continue killing germs  even after washing.  Please DO NOT use if you have an allergy to CHG or antibacterial soaps.  If your skin becomes reddened/irritated stop using the CHG and inform your nurse when you arrive at Short Stay.  Do not shave (including legs and underarms) for at least 48 hours prior to the first CHG shower.  You may shave your face.  Please follow these instructions carefully:   1.  Shower with CHG Soap the night before surgery and the  morning of Surgery.  2.  If you choose to wash your hair, wash your hair first as usual with your  normal shampoo.  3.  After you shampoo, rinse your hair and body thoroughly to remove the Shampoo.  4.  Use CHG as you would any other liquid soap.  You can apply chg directly to the skin and wash gently with scrungie or a clean washcloth.  5.  Apply the CHG Soap to your body ONLY FROM THE NECK DOWN.    Do not use on open wounds or open sores.  Avoid contact with your eyes, ears, mouth and genitals (private parts).  Wash genitals (private parts)  with your normal soap.  6.  Wash thoroughly, paying special attention to the area where your surgery will be performed.  7.  Thoroughly rinse your body with warm water from the neck down.  8.  DO NOT shower/wash with your normal soap after using and rinsing off  the CHG Soap.  9.  Pat yourself dry with a clean towel.            10.  Wear clean pajamas.            11.  Place clean sheets on your bed the night of your first shower and do not sleep with pets.  Day of Surgery  Do not apply any lotions/deoderants the morning of surgery.  Please wear clean clothes to the hospital/surgery center.     Please read over the following fact sheets that you were given. Pain Booklet and Surgical Site Infection Prevention

## 2015-10-13 NOTE — Telephone Encounter (Signed)
Crystal from [employer], caled and asked what time to be here for pacemaker check of the patient?, informed her that  his appt is for 1200 noon today, she stated she would be here between 1130-1145am  Will let Joaquim Lai RN know,  7:47 AM

## 2015-10-14 ENCOUNTER — Telehealth: Payer: Self-pay | Admitting: Radiation Oncology

## 2015-10-14 ENCOUNTER — Encounter: Payer: Self-pay | Admitting: Radiation Oncology

## 2015-10-14 ENCOUNTER — Encounter (HOSPITAL_COMMUNITY): Payer: Self-pay

## 2015-10-14 MED ORDER — CEFAZOLIN SODIUM-DEXTROSE 2-4 GM/100ML-% IV SOLN
2.0000 g | INTRAVENOUS | Status: AC
  Administered 2015-10-15: 2 g via INTRAVENOUS
  Filled 2015-10-14: qty 100

## 2015-10-14 MED ORDER — IOPAMIDOL (ISOVUE-300) INJECTION 61%
50.0000 mL | Freq: Once | INTRAVENOUS | Status: AC | PRN
  Administered 2015-10-13: 50 mL via INTRAVENOUS

## 2015-10-14 NOTE — Op Note (Signed)
  Name: David [name]  MRN: [id number]  Date: 10/07/2015   DOB: 13-Feb-1941  Stereotactic Radiosurgery Operative Note  PRE-OPERATIVE DIAGNOSIS:  Solitary Brain Metastasis  POST-OPERATIVE DIAGNOSIS:  Solitary Brain Metastasis  PROCEDURE:  Stereotactic Radiosurgery  SURGEON:  Peggyann Shoals, MD  NARRATIVE: The patient underwent a radiation treatment planning session in the radiation oncology simulation suite under the care of the radiation oncology physician and physicist.  I participated closely in the radiation treatment planning afterwards. The patient underwent planning CT which was fused to 3T high resolution MRI with 1 mm axial slices.  These images were fused on the planning system.  We contoured the gross target volumes and subsequently expanded this to yield the Planning Target Volume. I actively participated in the planning process.  I helped to define and review the target contours and also the contours of the optic pathway, eyes, brainstem and selected nearby organs at risk.  All the dose constraints for critical structures were reviewed and compared to AAPM Task Group 101.  The prescription dose conformity was reviewed.  I approved the plan electronically.    Accordingly, David [name] was brought to the TrueBeam stereotactic radiation treatment linac and placed in the custom immobilization mask.  The patient was aligned according to the IR fiducial markers with BrainLab Exactrac, then orthogonal x-rays were used in ExacTrac with the 6DOF robotic table and the shifts were made to align the patient  David [name] received stereotactic radiosurgery uneventfully.    The detailed description of the procedure is recorded in the radiation oncology procedure note.  I was present for the duration of the procedure.  DISPOSITION:  Following delivery, the patient was transported to nursing in stable condition and monitored for possible acute effects to be discharged to home in stable condition  with follow-up in one month.  Peggyann Shoals, MD 10/14/2015 2:38 PM

## 2015-10-14 NOTE — Telephone Encounter (Signed)
David [name] called back and confirmed the patient does have a [employer] pacemaker. Requested he be present to evaluate the pacemaker before and after treatment since the patient has to be treated on 10/13/15 preoperatively. He confirmed a [employer] rep would be present.

## 2015-10-14 NOTE — Progress Notes (Signed)
Anesthesia Chart Review: Patient is a 75 year old male scheduled for craniotomy tumor excision with Curve on 10/15/15 by Dr. Vertell Limber.  History includes former smoker, HTN, hypercholesterolemia, metastatic melanoma to lung, node, bone, brain (presented with expressive aphasia related to left fontal hemorrhage 08/22/15), OSA (with CPAP), hypothyroidism, GERD, history of chest pain with MI 02/2011, CAD s/p CABG (LIMA-LAD, SVG-PDA 04/06/11), third degree AV block s/p pacemaker (by notes, original PPM '98 with generator change '03; s/p removal with upgrade to atrial synchronous [employer] Allure Quadra model 3242 Bi-V PPM CRT-P 02/25/13), renal disorder (not specified). Notes indicate his daughter is Dr. Silas [place] (OB-GYN).  - PCP is Dr. Barbee Shropshire.  - Cardiologist reported as Dr. Francee Gentile (but he is actually a CT surgeon). Last cardiology device clinic note signed by Dr. Aggie Cosier was on 07/01/15. Interrogation revealed "He is 85% atrially and 99% biventricular paced. He has not mode switched according to the memory. The device reports an estimated longevity of 4.3 years. The intrinsic rhythm is sinus with complete heart block., he is pacemaker dependent." Normal device function. The physician did not complete or sign our PPM perioperative device form that was faxed. - HEM-ONC is Dr. Burr Medico. RAD-ONC is Dr. Tammi Klippel for SBRT.  Meds include Coreg, Decadron, lisinopril, levothyroxine, Crestor, Zoloft, turmeric. He was started on Nivolumab every 2 weeks 10/06/15.  08/22/15 EKG: V-paced, underlying SR. Tracing appears similar to those from 02/2013 from [facility].  08/23/15 Echo: Study Conclusions - Procedure narrative: Transthoracic echocardiography. Image  quality was poor. The study was technically difficult, as a  result of poor sound wave transmission. - Left ventricle: The cavity size was normal. There was moderate  concentric hypertrophy. Systolic function was vigorous. The  estimated  ejection fraction was in the range of 65% to 70%. Wall  motion was normal; there were no regional wall motion  abnormalities. Doppler parameters are consistent with abnormal  left ventricular relaxation (grade 1 diastolic dysfunction).  There was no evidence of elevated ventricular filling pressure by  Doppler parameters. - Aortic valve: There was no regurgitation. - Aortic root: The aortic root was normal in size. - Ascending aorta: The ascending aorta was normal in size. - Mitral valve: There was no regurgitation. - Left atrium: The atrium was normal in size. - Right ventricle: The cavity size was normal. Wall thickness was  normal. Systolic function was normal. - Right atrium: The atrium was normal in size. - Tricuspid valve: There was mild regurgitation. - Pulmonic valve: There was no regurgitation. - Pulmonary arteries: Systolic pressure was within the normal  range. - Inferior vena cava: The vessel was normal in size. - Pericardium, extracardiac: There was no pericardial effusion. (EF ~ 40-45% by comparison echo from [facility] on 04/06/11.)  03/07/11 Cardiac cath [facility], Dr. Alla German): Summary: 1. Normal origin of the coronary arteries with right dominant system. Ramus intermedius is present. 2. Occlusion of mid LAD with moderate to severe disease proximal to the occlusion. 3. At least an ejection fraction of 40% or greater. This needs further evaluation with an echocardiogram. Recommendation: Consideration will have to be given to coronary bypass grafting and/or percutaneous intervention. Given the patient's recent GI bleed, both of these strategies are fraught with potential complications do to the need for anticoagulation with both procedures and then thereafter with stenting. Discussion will be held with the patient, GI, cardiothoracic surgery and the patient's daughter was brought up today regarding this. (S/P CABG LIMA-LAD, SVG-PDA  04/06/11.)  08/23/15 Carotid U/S: Summary: There is no obvious evidence of hemodynamically significant internal carotid artery stenosis bilaterally. Vertebral arteries are patent with antegrade flow.  08/23/15 1V CXR: IMPRESSION: Apparent left lower lobe mass measuring 3.8 x 3.0 cm. Advise correlation with chest CT, ideally with intravenous contrast, to further evaluate. Lungs elsewhere clear. Heart mildly enlarged with pacemaker leads as described. Patient is also status post coronary artery bypass grafting.  09/01/15 Chest CT: IMPRESSION: - 2.8 x 2.5 x 2.1 cm well-circumscribed mass in the left upper lobe in the lingular region, not described on a CT of 2004. This is likely to represent a malignancy. - 6 mm nodule posterior laterally in the left upper lobe, image 35. This is nonspecific and could be a benign nodule, a second primary or a metastasis. - No lymphadenopathy. - PET scan may be useful in evaluating these 2 areas (done 09/16/15).  10/13/15 Head CT: IMPRESSION: 1. Cystic and solid left frontal lobe mass with mild enlargement - especially the solid component - since 10/04/2015. Increased appearance of surrounding vasogenic edema, although regional mass effect remains mild. 2. No new brain metastasis or new intracranial abnormality identified. 3. Indeterminate left parietal bone lucent lesion appears increased by 1-2 mm since May and as such is suspicious for a small bone metastasis.  Preoperative labs noted. Cr 1.24, glucose 125. CBC WNL. T&S done.  Patient with known cardiac history (CABG within five years), he is pacer dependent with recent PPM interrogation, and EF normal by 08/2015 echo. Unfortunately, now with metastatic melanoma with need for craniotomy. If no acute changes then I would anticipate that he could proceed as planned.  Reviewed with anesthesiologist Dr. Lissa Hoard.  [facility] Short Stay Center/Anesthesiology Phone [phone] 10/14/2015  2:56 PM

## 2015-10-14 NOTE — Telephone Encounter (Signed)
Received call from Dr. [name] reference her father. She reports his decadron was picked up today and that he took a double dose and is feeling much better. She understands to contact this RN with any future needs.

## 2015-10-14 NOTE — Telephone Encounter (Signed)
Patient unable to recall name of physician would inserted pacemaker nor does he have a pacemaker card. Phoned Windle Guard with [employer]. Left message requesting him to inquire if the patient's pacemaker is a [employer] officer, political party.

## 2015-10-14 NOTE — Anesthesia Preprocedure Evaluation (Addendum)
Anesthesia Evaluation  Patient identified by MRN, date of birth, ID band Patient awake    Reviewed: Allergy & Precautions, NPO status , Patient's Chart, lab work & pertinent test results  History of Anesthesia Complications Negative for: history of anesthetic complications  Airway Mallampati: II  TM Distance: >3 FB Neck ROM: Full    Dental no notable dental hx. (+) Caps, Poor Dentition   Pulmonary sleep apnea and Continuous Positive Airway Pressure Ventilation , former smoker,  Mass of lower lobe of left lung      Pulmonary exam normal breath sounds clear to auscultation       Cardiovascular Exercise Tolerance: Poor hypertension, Pt. on medications and Pt. on home beta blockers + angina with exertion + CAD, + Past MI and + CABG  Normal cardiovascular exam+ dysrhythmias + pacemaker  Rhythm:Regular Rate:Normal  Pacemaker due to syncope, 3rd degree HB (upgrade to [employer] Bi-V PPM CRT-P 02/25/13 LV EF: 65% - 70%  CAD s/p CABG (LIMA-LAD, SVG-PDA 04/06/11)  Pacemaker Dependent    Neuro/Psych Aphasia, hemiplegia, and hemiparesis following nontraumatic intracerebral hemorrhage affecting right dominant side       CVA, Residual Symptoms negative psych ROS   GI/Hepatic Neg liver ROS, GERD  Medicated,  Endo/Other  Hypothyroidism   Renal/GU Renal InsufficiencyRenal disease  negative genitourinary   Musculoskeletal  (+) Arthritis ,   Abdominal (+) + obese,   Peds negative pediatric ROS (+)  Hematology negative hematology ROS (+)   Anesthesia Other Findings Metastatic melanoma    Reproductive/Obstetrics negative OB ROS                        EKG Aug 22, 2015 Normal sinus rhythm Ventricular pre-excitation, WPW pattern type A Abnormal ECG paced.   BP Readings from Last 3 Encounters:  10/13/15 118/64  10/13/15 116/83  10/08/15 112/65   Lab Results  Component Value Date   WBC 5.9  10/13/2015   HGB 13.0 10/13/2015   HCT 39.2 10/13/2015   MCV 92.9 10/13/2015   PLT 211 10/13/2015     Chemistry      Component Value Date/Time   NA 137 10/13/2015 1511   NA 138 10/06/2015 1248   K 4.6 10/13/2015 1511   K 4.5 10/06/2015 1248   CL 107 10/13/2015 1511   CO2 22 10/13/2015 1511   CO2 22 10/06/2015 1248   BUN 31* 10/13/2015 1511   BUN 33.7* 10/06/2015 1248   CREATININE 1.24 10/13/2015 1511   CREATININE 1.1 10/06/2015 1248      Component Value Date/Time   CALCIUM 9.6 10/13/2015 1511   CALCIUM 9.6 10/06/2015 1248   ALKPHOS 55 10/13/2015 1511   ALKPHOS 77 10/06/2015 1248   AST 24 10/13/2015 1511   AST 29 10/06/2015 1248   ALT 20 10/13/2015 1511   ALT 28 10/06/2015 1248   BILITOT 0.7 10/13/2015 1511   BILITOT <0.30 10/06/2015 1248     Lab Results  Component Value Date   INR 1.21 09/21/2015   INR 1.03 08/22/2015    Anesthesia Physical Anesthesia Plan  ASA: III  Anesthesia Plan: General   Post-op Pain Management:    Induction: Intravenous  Airway Management Planned: Oral ETT  Additional Equipment: Arterial line and CVP  Intra-op Plan:   Post-operative Plan: Possible Post-op intubation/ventilation  Informed Consent: I have reviewed the patients History and Physical, chart, labs and discussed the procedure including the risks, benefits and alternatives for the proposed anesthesia with the patient or  authorized representative who has indicated his/her understanding and acceptance.   Dental advisory given  Plan Discussed with: CRNA  Anesthesia Plan Comments:        Anesthesia Quick Evaluation

## 2015-10-15 ENCOUNTER — Inpatient Hospital Stay (HOSPITAL_COMMUNITY): Payer: Medicare Other

## 2015-10-15 ENCOUNTER — Inpatient Hospital Stay (HOSPITAL_COMMUNITY)
Admission: RE | Admit: 2015-10-15 | Discharge: 2015-10-21 | DRG: 025 | Disposition: A | Discharge status: Home Health | Payer: Medicare Other | Source: Ambulatory Visit | Attending: Neurosurgery | Admitting: Neurosurgery

## 2015-10-15 ENCOUNTER — Inpatient Hospital Stay (HOSPITAL_COMMUNITY): Payer: Medicare Other | Admitting: Certified Registered Nurse Anesthetist

## 2015-10-15 ENCOUNTER — Encounter (HOSPITAL_COMMUNITY)
Admission: RE | Disposition: A | Discharge status: Home Health | Payer: Self-pay | Source: Ambulatory Visit | Attending: Neurosurgery

## 2015-10-15 DIAGNOSIS — E039 Hypothyroidism, unspecified: Secondary | ICD-10-CM | POA: Diagnosis present

## 2015-10-15 DIAGNOSIS — Z79899 Other long term (current) drug therapy: Secondary | ICD-10-CM

## 2015-10-15 DIAGNOSIS — I69920 Aphasia following unspecified cerebrovascular disease: Secondary | ICD-10-CM | POA: Diagnosis not present

## 2015-10-15 DIAGNOSIS — G936 Cerebral edema: Secondary | ICD-10-CM | POA: Diagnosis present

## 2015-10-15 DIAGNOSIS — I10 Essential (primary) hypertension: Secondary | ICD-10-CM | POA: Diagnosis present

## 2015-10-15 DIAGNOSIS — E7800 Pure hypercholesterolemia, unspecified: Secondary | ICD-10-CM | POA: Diagnosis present

## 2015-10-15 DIAGNOSIS — Z950 Presence of cardiac pacemaker: Secondary | ICD-10-CM | POA: Diagnosis not present

## 2015-10-15 DIAGNOSIS — G4730 Sleep apnea, unspecified: Secondary | ICD-10-CM | POA: Diagnosis present

## 2015-10-15 DIAGNOSIS — I252 Old myocardial infarction: Secondary | ICD-10-CM | POA: Diagnosis not present

## 2015-10-15 DIAGNOSIS — K219 Gastro-esophageal reflux disease without esophagitis: Secondary | ICD-10-CM | POA: Diagnosis present

## 2015-10-15 DIAGNOSIS — M1990 Unspecified osteoarthritis, unspecified site: Secondary | ICD-10-CM | POA: Diagnosis present

## 2015-10-15 DIAGNOSIS — I2510 Atherosclerotic heart disease of native coronary artery without angina pectoris: Secondary | ICD-10-CM | POA: Diagnosis present

## 2015-10-15 DIAGNOSIS — I69959 Hemiplegia and hemiparesis following unspecified cerebrovascular disease affecting unspecified side: Secondary | ICD-10-CM | POA: Diagnosis not present

## 2015-10-15 DIAGNOSIS — C7931 Secondary malignant neoplasm of brain: Secondary | ICD-10-CM | POA: Diagnosis present

## 2015-10-15 DIAGNOSIS — C439 Malignant melanoma of skin, unspecified: Secondary | ICD-10-CM | POA: Diagnosis present

## 2015-10-15 DIAGNOSIS — Z87891 Personal history of nicotine dependence: Secondary | ICD-10-CM | POA: Diagnosis not present

## 2015-10-15 DIAGNOSIS — I619 Nontraumatic intracerebral hemorrhage, unspecified: Secondary | ICD-10-CM

## 2015-10-15 DIAGNOSIS — R531 Weakness: Secondary | ICD-10-CM | POA: Diagnosis present

## 2015-10-15 DIAGNOSIS — Z951 Presence of aortocoronary bypass graft: Secondary | ICD-10-CM | POA: Diagnosis not present

## 2015-10-15 DIAGNOSIS — Z452 Encounter for adjustment and management of vascular access device: Secondary | ICD-10-CM

## 2015-10-15 HISTORY — PX: CRANIOTOMY: SHX93

## 2015-10-15 HISTORY — PX: APPLICATION OF CRANIAL NAVIGATION: SHX6578

## 2015-10-15 LAB — MRSA PCR SCREENING: MRSA BY PCR: NEGATIVE

## 2015-10-15 SURGERY — CRANIOTOMY TUMOR EXCISION
Anesthesia: General | Site: Head

## 2015-10-15 MED ORDER — SENNOSIDES-DOCUSATE SODIUM 8.6-50 MG PO TABS
1.0000 | ORAL_TABLET | Freq: Two times a day (BID) | ORAL | Status: DC
  Administered 2015-10-15 – 2015-10-19 (×7): 1 via ORAL
  Filled 2015-10-15 (×10): qty 1

## 2015-10-15 MED ORDER — PROMETHAZINE HCL 25 MG PO TABS: 12.5000 mg | ORAL_TABLET | ORAL | Status: DC | PRN

## 2015-10-15 MED ORDER — PROMETHAZINE HCL 25 MG/ML IJ SOLN
INTRAMUSCULAR | Status: AC
  Administered 2015-10-15: 6.25 mg via INTRAVENOUS
  Filled 2015-10-15: qty 1

## 2015-10-15 MED ORDER — SODIUM CHLORIDE 0.9 % IV SOLN
500.0000 mg | Freq: Once | INTRAVENOUS | Status: AC
  Administered 2015-10-15: 500 mg via INTRAVENOUS
  Filled 2015-10-15: qty 5

## 2015-10-15 MED ORDER — DEXAMETHASONE SODIUM PHOSPHATE 10 MG/ML IJ SOLN
6.0000 mg | Freq: Four times a day (QID) | INTRAMUSCULAR | Status: AC
  Administered 2015-10-15 – 2015-10-16 (×4): 6 mg via INTRAVENOUS
  Filled 2015-10-15 (×4): qty 1

## 2015-10-15 MED ORDER — SERTRALINE HCL 50 MG PO TABS
125.0000 mg | ORAL_TABLET | Freq: Every day | ORAL | Status: DC
  Administered 2015-10-16 – 2015-10-21 (×6): 125 mg via ORAL
  Filled 2015-10-15 (×4): qty 3
  Filled 2015-10-15 (×2): qty 1

## 2015-10-15 MED ORDER — METOCLOPRAMIDE HCL 5 MG/ML IJ SOLN
INTRAMUSCULAR | Status: AC
  Filled 2015-10-15: qty 2

## 2015-10-15 MED ORDER — SUGAMMADEX SODIUM 200 MG/2ML IV SOLN
INTRAVENOUS | Status: DC | PRN
  Administered 2015-10-15: 200 mg via INTRAVENOUS

## 2015-10-15 MED ORDER — ONDANSETRON HCL 4 MG PO TABS: 4.0000 mg | ORAL_TABLET | ORAL | Status: DC | PRN

## 2015-10-15 MED ORDER — DOCUSATE SODIUM 100 MG PO CAPS
100.0000 mg | ORAL_CAPSULE | Freq: Two times a day (BID) | ORAL | Status: DC
  Administered 2015-10-15 – 2015-10-21 (×11): 100 mg via ORAL
  Filled 2015-10-15 (×12): qty 1

## 2015-10-15 MED ORDER — FLEET ENEMA 7-19 GM/118ML RE ENEM: 1.0000 | ENEMA | Freq: Once | RECTAL | Status: DC | PRN

## 2015-10-15 MED ORDER — BUPIVACAINE HCL (PF) 0.5 % IJ SOLN
INTRAMUSCULAR | Status: DC | PRN
  Administered 2015-10-15: 7.5 mL

## 2015-10-15 MED ORDER — ADULT MULTIVITAMIN W/MINERALS CH
1.0000 | ORAL_TABLET | Freq: Every day | ORAL | Status: DC
  Administered 2015-10-16 – 2015-10-21 (×6): 1 via ORAL
  Filled 2015-10-15 (×6): qty 1

## 2015-10-15 MED ORDER — PHENYLEPHRINE HCL 10 MG/ML IJ SOLN
10.0000 mg | INTRAVENOUS | Status: DC | PRN
  Administered 2015-10-15: 10:00:00 via INTRAVENOUS
  Administered 2015-10-15: 15 ug/min via INTRAVENOUS

## 2015-10-15 MED ORDER — LEVOTHYROXINE SODIUM 25 MCG PO TABS
125.0000 ug | ORAL_TABLET | Freq: Every day | ORAL | Status: DC
  Administered 2015-10-16 – 2015-10-21 (×6): 125 ug via ORAL
  Filled 2015-10-15 (×6): qty 1

## 2015-10-15 MED ORDER — CEFAZOLIN SODIUM-DEXTROSE 2-4 GM/100ML-% IV SOLN
2.0000 g | Freq: Three times a day (TID) | INTRAVENOUS | Status: AC
  Administered 2015-10-15 – 2015-10-16 (×2): 2 g via INTRAVENOUS
  Filled 2015-10-15 (×2): qty 100

## 2015-10-15 MED ORDER — LIDOCAINE-EPINEPHRINE 1 %-1:100000 IJ SOLN
INTRAMUSCULAR | Status: DC | PRN
  Administered 2015-10-15: 7.5 mL

## 2015-10-15 MED ORDER — METOCLOPRAMIDE HCL 5 MG/ML IJ SOLN
10.0000 mg | Freq: Once | INTRAMUSCULAR | Status: AC | PRN
  Administered 2015-10-15: 10 mg via INTRAVENOUS

## 2015-10-15 MED ORDER — FENTANYL CITRATE (PF) 100 MCG/2ML IJ SOLN: 25.0000 ug | INTRAMUSCULAR | Status: DC | PRN

## 2015-10-15 MED ORDER — TURMERIC CURCUMIN 500 MG PO CAPS: 500.0000 mg | ORAL_CAPSULE | Freq: Two times a day (BID) | ORAL | Status: DC

## 2015-10-15 MED ORDER — OXYCODONE-ACETAMINOPHEN 5-325 MG PO TABS
1.0000 | ORAL_TABLET | ORAL | Status: DC | PRN
  Administered 2015-10-15: 2 via ORAL
  Administered 2015-10-15 – 2015-10-16 (×5): 1 via ORAL
  Filled 2015-10-15 (×4): qty 1
  Filled 2015-10-15: qty 2
  Filled 2015-10-15: qty 1

## 2015-10-15 MED ORDER — POTASSIUM CHLORIDE IN NACL 20-0.9 MEQ/L-% IV SOLN
INTRAVENOUS | Status: DC
  Administered 2015-10-15 – 2015-10-18 (×2): via INTRAVENOUS
  Filled 2015-10-15 (×13): qty 1000

## 2015-10-15 MED ORDER — ROSUVASTATIN CALCIUM 10 MG PO TABS
10.0000 mg | ORAL_TABLET | Freq: Every day | ORAL | Status: DC
  Administered 2015-10-15 – 2015-10-20 (×6): 10 mg via ORAL
  Filled 2015-10-15 (×6): qty 1

## 2015-10-15 MED ORDER — DEXAMETHASONE SODIUM PHOSPHATE 4 MG/ML IJ SOLN
4.0000 mg | Freq: Three times a day (TID) | INTRAMUSCULAR | Status: DC
  Administered 2015-10-17 – 2015-10-19 (×7): 4 mg via INTRAVENOUS
  Filled 2015-10-15 (×7): qty 1

## 2015-10-15 MED ORDER — CARVEDILOL 3.125 MG PO TABS
3.1250 mg | ORAL_TABLET | Freq: Two times a day (BID) | ORAL | Status: DC
Start: 2015-10-15 — End: 2015-10-21
  Administered 2015-10-15 – 2015-10-21 (×12): 3.125 mg via ORAL
  Filled 2015-10-15 (×12): qty 1

## 2015-10-15 MED ORDER — ONDANSETRON HCL 4 MG/2ML IJ SOLN
INTRAMUSCULAR | Status: AC
  Filled 2015-10-15: qty 2

## 2015-10-15 MED ORDER — SUGAMMADEX SODIUM 200 MG/2ML IV SOLN
INTRAVENOUS | Status: AC
  Filled 2015-10-15: qty 2

## 2015-10-15 MED ORDER — ROCURONIUM BROMIDE 100 MG/10ML IV SOLN
INTRAVENOUS | Status: DC | PRN
  Administered 2015-10-15 (×2): 50 mg via INTRAVENOUS
  Administered 2015-10-15: 10 mg via INTRAVENOUS

## 2015-10-15 MED ORDER — BISACODYL 10 MG RE SUPP
10.0000 mg | Freq: Every day | RECTAL | Status: DC | PRN
  Administered 2015-10-18 – 2015-10-20 (×3): 10 mg via RECTAL
  Filled 2015-10-15 (×3): qty 1

## 2015-10-15 MED ORDER — ACETAMINOPHEN 325 MG PO TABS
650.0000 mg | ORAL_TABLET | Freq: Four times a day (QID) | ORAL | Status: DC
  Administered 2015-10-15 – 2015-10-20 (×17): 650 mg via ORAL
  Filled 2015-10-15 (×17): qty 2

## 2015-10-15 MED ORDER — PHENYLEPHRINE HCL 10 MG/ML IJ SOLN
INTRAMUSCULAR | Status: DC | PRN
  Administered 2015-10-15 (×2): 40 ug via INTRAVENOUS

## 2015-10-15 MED ORDER — MEPERIDINE HCL 25 MG/ML IJ SOLN: 6.2500 mg | INTRAMUSCULAR | Status: DC | PRN

## 2015-10-15 MED ORDER — DEXAMETHASONE SODIUM PHOSPHATE 4 MG/ML IJ SOLN
4.0000 mg | Freq: Four times a day (QID) | INTRAMUSCULAR | Status: AC
  Administered 2015-10-16 – 2015-10-17 (×4): 4 mg via INTRAVENOUS
  Filled 2015-10-15 (×4): qty 1

## 2015-10-15 MED ORDER — ACETAMINOPHEN 650 MG RE SUPP: 650.0000 mg | RECTAL | Status: DC | PRN

## 2015-10-15 MED ORDER — MORPHINE SULFATE (PF) 2 MG/ML IV SOLN
1.0000 mg | INTRAVENOUS | Status: DC | PRN
  Administered 2015-10-15: 1 mg via INTRAVENOUS
  Filled 2015-10-15: qty 1

## 2015-10-15 MED ORDER — FAMOTIDINE IN NACL 20-0.9 MG/50ML-% IV SOLN
20.0000 mg | Freq: Two times a day (BID) | INTRAVENOUS | Status: DC
  Administered 2015-10-15 (×2): 20 mg via INTRAVENOUS
  Filled 2015-10-15 (×3): qty 50

## 2015-10-15 MED ORDER — FENTANYL CITRATE (PF) 250 MCG/5ML IJ SOLN
INTRAMUSCULAR | Status: AC
  Filled 2015-10-15: qty 5

## 2015-10-15 MED ORDER — THROMBIN 5000 UNITS EX SOLR
OROMUCOSAL | Status: DC | PRN
  Administered 2015-10-15 (×2): via TOPICAL

## 2015-10-15 MED ORDER — FENTANYL CITRATE (PF) 250 MCG/5ML IJ SOLN
INTRAMUSCULAR | Status: DC | PRN
  Administered 2015-10-15 (×2): 50 ug via INTRAVENOUS
  Administered 2015-10-15: 100 ug via INTRAVENOUS
  Administered 2015-10-15: 50 ug via INTRAVENOUS

## 2015-10-15 MED ORDER — 0.9 % SODIUM CHLORIDE (POUR BTL) OPTIME
TOPICAL | Status: DC | PRN
  Administered 2015-10-15 (×2): 1000 mL

## 2015-10-15 MED ORDER — CARVEDILOL 3.125 MG PO TABS
ORAL_TABLET | ORAL | Status: AC
  Filled 2015-10-15: qty 1

## 2015-10-15 MED ORDER — LEVETIRACETAM 500 MG/5ML IV SOLN
500.0000 mg | Freq: Two times a day (BID) | INTRAVENOUS | Status: DC
  Administered 2015-10-15 – 2015-10-18 (×6): 500 mg via INTRAVENOUS
  Filled 2015-10-15 (×8): qty 5

## 2015-10-15 MED ORDER — LISINOPRIL 5 MG PO TABS
5.0000 mg | ORAL_TABLET | Freq: Two times a day (BID) | ORAL | Status: DC
  Administered 2015-10-15 – 2015-10-21 (×12): 5 mg via ORAL
  Filled 2015-10-15 (×13): qty 1

## 2015-10-15 MED ORDER — ROCURONIUM BROMIDE 50 MG/5ML IV SOLN
INTRAVENOUS | Status: AC
  Filled 2015-10-15: qty 1

## 2015-10-15 MED ORDER — ONDANSETRON HCL 4 MG/2ML IJ SOLN
4.0000 mg | INTRAMUSCULAR | Status: DC | PRN
  Administered 2015-10-15 – 2015-10-17 (×3): 4 mg via INTRAVENOUS
  Filled 2015-10-15 (×6): qty 2

## 2015-10-15 MED ORDER — PROPOFOL 10 MG/ML IV BOLUS
INTRAVENOUS | Status: AC
  Filled 2015-10-15: qty 20

## 2015-10-15 MED ORDER — LABETALOL HCL 5 MG/ML IV SOLN
10.0000 mg | INTRAVENOUS | Status: DC | PRN
Start: 2015-10-15 — End: 2015-10-19
  Administered 2015-10-18: 20 mg via INTRAVENOUS
  Filled 2015-10-15: qty 4

## 2015-10-15 MED ORDER — HYDROCODONE-ACETAMINOPHEN 5-325 MG PO TABS
1.0000 | ORAL_TABLET | ORAL | Status: DC | PRN
  Administered 2015-10-15: 1 via ORAL
  Filled 2015-10-15: qty 1

## 2015-10-15 MED ORDER — ACETAMINOPHEN 325 MG PO TABS
650.0000 mg | ORAL_TABLET | ORAL | Status: DC | PRN
  Administered 2015-10-21: 650 mg via ORAL
  Filled 2015-10-15 (×2): qty 2

## 2015-10-15 MED ORDER — DEXAMETHASONE SODIUM PHOSPHATE 10 MG/ML IJ SOLN
INTRAMUSCULAR | Status: DC | PRN
  Administered 2015-10-15: 10 mg via INTRAVENOUS

## 2015-10-15 MED ORDER — CHLORHEXIDINE GLUCONATE CLOTH 2 % EX PADS: 6.0000 | MEDICATED_PAD | Freq: Once | CUTANEOUS | Status: DC

## 2015-10-15 MED ORDER — SURGIFOAM 100 EX MISC
CUTANEOUS | Status: DC | PRN
  Administered 2015-10-15: 09:00:00 via TOPICAL

## 2015-10-15 MED ORDER — HYDROMORPHONE HCL 1 MG/ML IJ SOLN: 0.5000 mg | INTRAMUSCULAR | Status: DC | PRN

## 2015-10-15 MED ORDER — SODIUM CHLORIDE 0.9 % IV SOLN
INTRAVENOUS | Status: DC | PRN
  Administered 2015-10-15: 08:00:00 via INTRAVENOUS

## 2015-10-15 MED ORDER — LIDOCAINE 2% (20 MG/ML) 5 ML SYRINGE
INTRAMUSCULAR | Status: DC | PRN
  Administered 2015-10-15: 100 mg via INTRAVENOUS

## 2015-10-15 MED ORDER — HEMOSTATIC AGENTS (NO CHARGE) OPTIME
TOPICAL | Status: DC | PRN
  Administered 2015-10-15: 1 via TOPICAL

## 2015-10-15 MED ORDER — CARVEDILOL 3.125 MG PO TABS
3.1250 mg | ORAL_TABLET | Freq: Once | ORAL | Status: AC
Start: 2015-10-15 — End: 2015-10-15
  Administered 2015-10-15: 3.125 mg via ORAL
  Filled 2015-10-15: qty 1

## 2015-10-15 MED ORDER — BACITRACIN ZINC 500 UNIT/GM EX OINT
TOPICAL_OINTMENT | CUTANEOUS | Status: DC | PRN
  Administered 2015-10-15: 1 via TOPICAL

## 2015-10-15 MED ORDER — SODIUM CHLORIDE 0.9 % IV SOLN
INTRAVENOUS | Status: DC | PRN
  Administered 2015-10-15: 07:00:00 via INTRAVENOUS

## 2015-10-15 MED ORDER — SENNOSIDES-DOCUSATE SODIUM 8.6-50 MG PO TABS
1.0000 | ORAL_TABLET | Freq: Every evening | ORAL | Status: DC | PRN
  Administered 2015-10-17: 1 via ORAL

## 2015-10-15 MED ORDER — ONDANSETRON HCL 4 MG/2ML IJ SOLN
INTRAMUSCULAR | Status: DC | PRN
  Administered 2015-10-15: 4 mg via INTRAVENOUS

## 2015-10-15 MED ORDER — PROMETHAZINE HCL 25 MG/ML IJ SOLN
6.2500 mg | Freq: Four times a day (QID) | INTRAMUSCULAR | Status: DC | PRN
  Administered 2015-10-15: 6.25 mg via INTRAVENOUS

## 2015-10-15 MED ORDER — PROPOFOL 10 MG/ML IV BOLUS
INTRAVENOUS | Status: DC | PRN
  Administered 2015-10-15: 120 mg via INTRAVENOUS

## 2015-10-15 SURGICAL SUPPLY — 85 items
BANDAGE GAUZE 4  KLING STR (GAUZE/BANDAGES/DRESSINGS) ×6 IMPLANT
BIT DRILL WIRE PASS 1.3MM (BIT) IMPLANT
BLADE CLIPPER SURG (BLADE) ×3 IMPLANT
BRUSH SCRUB EZ 1% IODOPHOR (MISCELLANEOUS) ×3 IMPLANT
BRUSH SCRUB EZ PLAIN DRY (MISCELLANEOUS) ×3 IMPLANT
BUR ACORN 6.0 PRECISION (BURR) ×3 IMPLANT
BUR ADDG 1.1 (BURR) IMPLANT
BUR SPIRAL ROUTER 2.3 (BUR) ×3 IMPLANT
CANISTER SUCT 3000ML PPV (MISCELLANEOUS) ×3 IMPLANT
CLIP TI MEDIUM 6 (CLIP) IMPLANT
CONT SPEC 4OZ CLIKSEAL STRL BL (MISCELLANEOUS) ×3 IMPLANT
DECANTER SPIKE VIAL GLASS SM (MISCELLANEOUS) ×3 IMPLANT
DRAIN SNY WOU 7FLT (WOUND CARE) IMPLANT
DRAPE MICROSCOPE LEICA (MISCELLANEOUS) ×1 IMPLANT
DRAPE NEUROLOGICAL W/INCISE (DRAPES) ×3 IMPLANT
DRAPE WARM FLUID 44X44 (DRAPE) ×3 IMPLANT
DRILL WIRE PASS 1.3MM (BIT)
DRSG OPSITE 4X5.5 SM (GAUZE/BANDAGES/DRESSINGS) ×3 IMPLANT
DRSG TEGADERM 4X4.75 (GAUZE/BANDAGES/DRESSINGS) ×1 IMPLANT
DRSG TELFA 3X8 NADH (GAUZE/BANDAGES/DRESSINGS) ×3 IMPLANT
DURAMATRIX ONLAY 2X2 (Neuro Prosthesis/Implant) ×1 IMPLANT
DURAMATRIX ONLAY 3X3 (Plate) ×1 IMPLANT
DURAPREP 6ML APPLICATOR 50/CS (WOUND CARE) ×3 IMPLANT
ELECT REM PT RETURN 9FT ADLT (ELECTROSURGICAL) ×3
ELECTRODE REM PT RTRN 9FT ADLT (ELECTROSURGICAL) ×2 IMPLANT
EVACUATOR 1/8 PVC DRAIN (DRAIN) IMPLANT
EVACUATOR SILICONE 100CC (DRAIN) IMPLANT
FORCEPS BIPOLAR SPETZLER 8 1.0 (NEUROSURGERY SUPPLIES) ×1 IMPLANT
GAUZE SPONGE 4X4 12PLY STRL (GAUZE/BANDAGES/DRESSINGS) ×2 IMPLANT
GAUZE SPONGE 4X4 16PLY XRAY LF (GAUZE/BANDAGES/DRESSINGS) IMPLANT
GLOVE BIO SURGEON STRL SZ8 (GLOVE) ×3 IMPLANT
GLOVE BIOGEL PI IND STRL 8 (GLOVE) ×2 IMPLANT
GLOVE BIOGEL PI IND STRL 8.5 (GLOVE) ×2 IMPLANT
GLOVE BIOGEL PI INDICATOR 8 (GLOVE) ×1
GLOVE BIOGEL PI INDICATOR 8.5 (GLOVE) ×1
GLOVE ECLIPSE 8.0 STRL XLNG CF (GLOVE) ×3 IMPLANT
GLOVE EXAM NITRILE LRG STRL (GLOVE) IMPLANT
GLOVE EXAM NITRILE MD LF STRL (GLOVE) IMPLANT
GLOVE EXAM NITRILE XL STR (GLOVE) IMPLANT
GLOVE EXAM NITRILE XS STR PU (GLOVE) IMPLANT
GOWN STRL REUS W/ TWL LRG LVL3 (GOWN DISPOSABLE) IMPLANT
GOWN STRL REUS W/ TWL XL LVL3 (GOWN DISPOSABLE) IMPLANT
GOWN STRL REUS W/TWL 2XL LVL3 (GOWN DISPOSABLE) IMPLANT
GOWN STRL REUS W/TWL LRG LVL3 (GOWN DISPOSABLE)
GOWN STRL REUS W/TWL XL LVL3 (GOWN DISPOSABLE)
HEMOSTAT POWDER KIT SURGIFOAM (HEMOSTASIS) ×2 IMPLANT
HEMOSTAT SURGICEL 2X14 (HEMOSTASIS) ×3 IMPLANT
KIT BASIN OR (CUSTOM PROCEDURE TRAY) ×3 IMPLANT
KIT ROOM TURNOVER OR (KITS) ×3 IMPLANT
MARKER SKIN DUAL TIP RULER LAB (MISCELLANEOUS) ×3 IMPLANT
MARKER SPHERE PSV REFLC 13MM (MARKER) ×12 IMPLANT
NDL HYPO 25X1 1.5 SAFETY (NEEDLE) ×2 IMPLANT
NEEDLE HYPO 25X1 1.5 SAFETY (NEEDLE) ×3 IMPLANT
NS IRRIG 1000ML POUR BTL (IV SOLUTION) ×6 IMPLANT
PACK CRANIOTOMY (CUSTOM PROCEDURE TRAY) ×3 IMPLANT
PAD ARMBOARD 7.5X6 YLW CONV (MISCELLANEOUS) ×4 IMPLANT
PAD DRESSING TELFA 3X8 NADH (GAUZE/BANDAGES/DRESSINGS) IMPLANT
PAD EYE OVAL STERILE LF (GAUZE/BANDAGES/DRESSINGS) IMPLANT
PATTIES SURGICAL .25X.25 (GAUZE/BANDAGES/DRESSINGS) IMPLANT
PATTIES SURGICAL .5 X.5 (GAUZE/BANDAGES/DRESSINGS) IMPLANT
PATTIES SURGICAL .5 X3 (DISPOSABLE) IMPLANT
PATTIES SURGICAL 1/4 X 3 (GAUZE/BANDAGES/DRESSINGS) IMPLANT
PATTIES SURGICAL 1X1 (DISPOSABLE) IMPLANT
PIN MAYFIELD SKULL DISP (PIN) ×3 IMPLANT
PLATE 1.5  2HOLE LNG NEURO (Plate) ×2 IMPLANT
PLATE 1.5 2HOLE LNG NEURO (Plate) IMPLANT
PLATE 1.5/0.5 18.5MM BURR HOLE (Plate) ×2 IMPLANT
RUBBERBAND STERILE (MISCELLANEOUS) IMPLANT
SCREW SELF DRILL HT 1.5/4MM (Screw) ×13 IMPLANT
SPECIMEN JAR SMALL (MISCELLANEOUS) IMPLANT
SPONGE NEURO XRAY DETECT 1X3 (DISPOSABLE) IMPLANT
SPONGE SURGIFOAM ABS GEL 100 (HEMOSTASIS) ×3 IMPLANT
STAPLER SKIN PROX WIDE 3.9 (STAPLE) ×3 IMPLANT
SUT ETHILON 3 0 FSL (SUTURE) IMPLANT
SUT NURALON 4 0 TR CR/8 (SUTURE) ×9 IMPLANT
SUT SILK 2 0 FS (SUTURE) IMPLANT
SUT VIC AB 2-0 CP2 18 (SUTURE) ×6 IMPLANT
SYR CONTROL 10ML LL (SYRINGE) ×3 IMPLANT
TIP SONASTAR STD MISONIX 1.9 (TRAY / TRAY PROCEDURE) IMPLANT
TOWEL OR 17X24 6PK STRL BLUE (TOWEL DISPOSABLE) ×3 IMPLANT
TOWEL OR 17X26 10 PK STRL BLUE (TOWEL DISPOSABLE) ×3 IMPLANT
TRAY FOLEY W/METER SILVER 16FR (SET/KITS/TRAYS/PACK) ×3 IMPLANT
TUBE CONNECTING 12X1/4 (SUCTIONS) ×3 IMPLANT
UNDERPAD 30X30 INCONTINENT (UNDERPADS AND DIAPERS) ×3 IMPLANT
WATER STERILE IRR 1000ML POUR (IV SOLUTION) ×3 IMPLANT

## 2015-10-15 NOTE — Brief Op Note (Signed)
10/15/2015  10:39 AM  PATIENT:  David [name]  75 y.o. male  PRE-OPERATIVE DIAGNOSIS:  Melanoma metastasis to brain (Left frontal region)  POST-OPERATIVE DIAGNOSIS:  Melanoma metastasis to brain (Left frontal region)  PROCEDURE:  Procedure(s) with comments: LEFT FRONTAL CRANIOTOMY TUMOR EXCISION with Curve (N/A) - CRANIOTOMY TUMOR EXCISION APPLICATION OF CRANIAL NAVIGATION (N/A) with microdissection  SURGEON:  Surgeon(s) and Role:    * Erline Levine, MD - Primary  PHYSICIAN ASSISTANT:   ASSISTANTS: Poteat, RN   ANESTHESIA:   general  EBL:  Total I/O In: 1600 [I.V.:1600] Out: 550 [Urine:400; Blood:150]  BLOOD ADMINISTERED:none  DRAINS: none   LOCAL MEDICATIONS USED:  LIDOCAINE   SPECIMEN:  Excision  DISPOSITION OF SPECIMEN:  PATHOLOGY  COUNTS:  YES  TOURNIQUET:  * No tourniquets in log *  DICTATION: Patient is 75 year old man with melanoma, which has metastasized to the brain.  He initially presented with a hemorrhage and aphasia.  This has improved.  It was elected to perform SRS to the tumor with craniotomy and tumor resection.   He had a BrainLab CT for surgical localization of tumor, as he has a pacemaker and is unable to have an MRI.  Procedure:  Following smooth intubation, patient was placed in right semi-lateral position with blanket roll.  Head was placed in pins and left frontal scalp was shaved and prepped and draped in usual sterile fashion after BrainLab navigation was localized to map tumor location.  Area of planned incision was infiltrated with lidocaine. A curvilinear pre-auricular incision was made and carried through temporalis fascia and muscle to expose calvarium.  Skull flap was elevated exposing the dura directly overlying the brain mass.  Dura was opened.  A corticotomy was created in the frontal lobe and carried to remove the brain metastasis.  The mass did not come to the surface, but there was some bloody discoloration where it came to the surface.   I made an opening between two gyri to expose the tumor.  The Curve and microscope were used to confirm extent of tumor resection.  Hemostasis was assured with irrigation and cotton balls.  Hemostasis was assured and the tumor cavity was lined with Surgicell. The dura was closed with 4-0 neurilon sutures along with tack up neurilon sutures and a dural patch graft. The bone flap was replaced with plates, the fascia and galea were closed with 2-0 vicryl sutures and the skin was re approximated with staples.  A sterile occlusive dressing was placed.  Patient was returned to a supine position and taken out of head pins, then extubated in the operating room, having tolerated surgery well.  Counts were correct at the end of the case.  PLAN OF CARE: Admit to inpatient   PATIENT DISPOSITION:  PACU - hemodynamically stable.   Delay start of Pharmacological VTE agent (>24hrs) due to surgical blood loss or risk of bleeding: yes

## 2015-10-15 NOTE — Transfer of Care (Signed)
Immediate Anesthesia Transfer of Care Note  Patient: David [name]  Procedure(s) Performed: Procedure(s) with comments: LEFT FRONTAL CRANIOTOMY TUMOR EXCISION with Curve (N/A) - CRANIOTOMY TUMOR EXCISION APPLICATION OF CRANIAL NAVIGATION (N/A)  Patient Location: PACU  Anesthesia Type:General  Level of Consciousness: awake, alert , oriented and patient cooperative  Airway & Oxygen Therapy: Patient Spontanous Breathing and Patient connected to nasal cannula oxygen  Post-op Assessment: Report given to RN, Post -op Vital signs reviewed and stable and Patient moving all extremities X 4  Post vital signs: Reviewed and stable  Last Vitals:  Filed Vitals:   10/15/15 0638 10/15/15 1046  BP: 120/74   Pulse: 76   Temp: 36.4 C   Resp:  15    Last Pain:  Filed Vitals:   10/15/15 1054  PainSc: 0-No pain      Patients Stated Pain Goal: 2 (15/95/39 [id number])  Complications: No apparent anesthesia complications

## 2015-10-15 NOTE — Anesthesia Procedure Notes (Addendum)
Procedure Name: Intubation Date/Time: 10/15/2015 7:45 AM Performed by: Layla Maw Pre-anesthesia Checklist: Patient identified, Patient being monitored, Timeout performed, Emergency Drugs available and Suction available Patient Re-evaluated:Patient Re-evaluated prior to inductionOxygen Delivery Method: Circle System Utilized Preoxygenation: Pre-oxygenation with 100% oxygen Intubation Type: IV induction Ventilation: Mask ventilation without difficulty and Two handed mask ventilation required Laryngoscope Size: Mac and 3 Grade View: Grade I Tube type: Oral Tube size: 7.5 mm Number of attempts: 1 Airway Equipment and Method: Stylet Placement Confirmation: ETT inserted through vocal cords under direct vision,  positive ETCO2 and breath sounds checked- equal and bilateral Secured at: 22 cm Tube secured with: Tape Dental Injury: Teeth and Oropharynx as per pre-operative assessment    Central Venous Catheter Insertion Performed by: anesthesiologist Patient location: Pre-op. Preanesthetic checklist: patient identified, IV checked, site marked, risks and benefits discussed, surgical consent, monitors and equipment checked, pre-op evaluation, timeout performed and anesthesia consent Position: Trendelenburg Lidocaine 1% used for infiltration Landmarks identified and Seldinger technique used Catheter size: 8 Fr Central line was placed.Double lumen Procedure performed using ultrasound guided technique. Attempts: 1 Following insertion, dressing applied, line sutured and Biopatch. Post procedure assessment: blood return through all ports. Patient tolerated the procedure well with no immediate complications.

## 2015-10-15 NOTE — H&P (Signed)
HISTORY OF PRESENT ILLNESS: David [name] is a 75 y.o. male seen at the request of Dr. Burr Medico for a newly noted metastatic melanoma to the left chest, left supraclavicular nodes, and left frontal lobe. The patient was found to have melanoma on three occasions, the most recent was resected from his right leg about 2-3 years ago, and the first time this was seen was about 20 years ago along the right neck. He reports that he has not required any systemic therapy in the past.   On 08/22/15 he presented with aphasia to the ED and a 5.7 x 4.5 cm left frontal acute hemorrhage was noted. A 6 mm left to right shift was seen. He did have a Chest X-Ray during that time that was suspicious for a mass in the LLL. A CT chest was performed on 09/01/15 and a 2.8x2.5 x2.1 cm mass in the left upper lobe, and a 30m nodule posterior laterally in the RUL was noted. No adenopathy was noted. A PET on 09/16/15 revealed hypermetabolic activity in the LLL lesion, low SUV of 2 in the 632mnodule in the RUL, a 60m52moft tissue nodule in the left clavical region and a extrapleural lesion int he mid right thorax without metabolic change. An 8mm33mdule in the left paraspinous musculature with hypermetabolic change was also seen. Hypermetabolic change in the right thyroid consistent with lesion was also seen. A biopsy of the left supraclavicular region revealed metastatic melanoma.   A follow up CT on 08/26/15 was performed due to an episode of incontinence. Which revealed 2 x 1.6 x 1.7 cm left frontal hyperdense lesion no additional lesion was noted. He has begun systemic Keytruda on 10/06/15, and his tumor was also tested for BRAF mutation and is positive which will guide future therapy. He is scheduled to meet with Dermatology next week.    PREVIOUS RADIATION THERAPY: No  PAST MEDICAL HISTORY:  Past Medical History  Diagnosis Date  . Hypertension   . Sleep apnea     uses CPAP  . High cholesterol   . Renal disorder    . Pacemaker     due to syncope  . Rectal bleed     due to NSAIDS  . Melanoma (HCC)[place]. Myocardial infarction (HCC)[facility]. Skin cancer     melanoma  . Brain cancer (HCC)[facility]  melanoma with brain met     PAST SURGICAL HISTORY: Past Surgical History  Procedure Laterality Date  . Cardiac surgery      bypass X 2  . Knee arthroscopy  12/2008  . Foot surgery  08/2010  . Lymph node biopsy    . Pacemaker insertion      FAMILY HISTORY:  Family History  Problem Relation Age of Onset  . Cancer Mother 78  35lung   . Hypertension Mother   . Hypertension Father   . Heart attack Father   . Rheum arthritis Sister     SOCIAL HISTORY:  Social History   Social History  . Marital Status: Married    Spouse Name: N/A  . Number of Children: N/A  . Years of Education: N/A   Occupational History  . Not on file.   Social History Main Topics  . Smoking status: Former Smoker -- 1.00 packs/day for 3 years    Quit date: 04/11/1919  . Smokeless tobacco: Never Used  . Alcohol Use: 2.4 oz/week    4 Glasses of wine per week  Comment: daily for the past 10 years   . Drug Use: No  . Sexual Activity: No   Other Topics Concern  . Not on file   Social History Narrative  The patient is a retired Pharmacist, community. He lives in [place] and is married. His wife suffers from dementia and they live in an assisted facility but still in a more independent setting. His daughter accompanies him and is an OB/GYN here in [place].  ALLERGIES: Zocor  MEDICATIONS:  Current Outpatient Prescriptions  Medication Sig Dispense Refill  . acetaminophen (TYLENOL) 325 MG tablet Take 2 tablets (650 mg total) by mouth every 6 (six) hours as needed for mild pain.    . carvedilol (COREG) 3.125 MG tablet Take 3.125 mg by mouth 2 (two) times daily with a meal.     . levothyroxine (SYNTHROID, LEVOTHROID) 125 MCG tablet Take 125 mcg by mouth daily before breakfast.    . Multiple Vitamin (MULTIVITAMIN WITH MINERALS) TABS tablet Take 1 tablet by mouth daily.    Marland Kitchen PRESCRIPTION MEDICATION Inhale into the lungs at bedtime. CPAP    . rosuvastatin (CRESTOR) 10 MG tablet Take 10 mg by mouth at bedtime.    . selenium sulfide (SELSUN) 1 % LOTN Apply 1 application topically daily. To head and face and rinse off    . senna-docusate (SENOKOT-S) 8.6-50 MG tablet Take 1 tablet by mouth 2 (two) times daily.    . sertraline (ZOLOFT) 50 MG tablet Take 2.5 tablets (125 mg total) by mouth daily.    . Turmeric Curcumin 500 MG CAPS Take 500 mg by mouth 2 (two) times daily.     No current facility-administered medications for this encounter.    REVIEW OF SYSTEMS: On review of systems, the patient reports that he is doing well overall. His speech has returned, and he denies any headaches, but notes weakness that is generalized and visual acuity has diminished a bit, but he denies any diplopia. 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 No other complaints are verbalized.  PHYSICAL EXAM: BP 112/65 mmHg  Pulse 75  Resp 16  Ht 5' 9" (1.753 m)  Wt 209 lb 6.4 oz (94.983 kg)  BMI 30.91 kg/m2  SpO2 100% In general this is a well appearing caucasian male in no acute distress. He is alert and oriented x4 and appropriate throughout the examination. HEENT reveals that the patient is normocephalic, atraumatic. Skin is intact without any evidence of gross lesions of the face or upper extremities. Cardiopulmonary assessment is negative for acute distress and he exhibits normal effort.    KPS = 80  100 - Normal; no complaints; no evidence of disease. 90 - Able to carry on normal activity; minor signs or symptoms of disease. 80 - Normal activity with effort; some signs or  symptoms of disease. 57 - Cares for self; unable to carry on normal activity or to do active work. 60 - Requires occasional assistance, but is able to care for most of his personal needs. 50 - Requires considerable assistance and frequent medical care. 14 - Disabled; requires special care and assistance. 54 - Severely disabled; hospital admission is indicated although death not imminent. 70 - Very sick; hospital admission necessary; active supportive treatment necessary. 10 - Moribund; fatal processes progressing rapidly. 0 - Dead  Karnofsky DA, Abelmann WH, Craver LS and Burchenal [facility] [phone]) The use of the nitrogen mustards in the palliative treatment of carcinoma: with particular reference  to bronchogenic carcinoma Cancer 1 634-56  LABORATORY DATA:  Lab Results  Component Value Date   WBC 7.3 10/06/2015   HGB 13.6 10/06/2015   HCT 39.3 10/06/2015   MCV 91.0 10/06/2015   PLT 237 10/06/2015   Lab Results  Component Value Date   NA 138 10/06/2015   K 4.5 10/06/2015   CL 104 09/04/2015   CO2 22 10/06/2015   Lab Results  Component Value Date   ALT 28 10/06/2015   AST 29 10/06/2015   ALKPHOS 77 10/06/2015   BILITOT <0.30 10/06/2015    RADIOGRAPHY: Ct Head W Wo Contrast  10/04/2015 CLINICAL DATA: Continued surveillance LEFT frontal hemorrhage. Biopsy of a LEFT neck lymph node yielded melanoma. Pacemaker. EXAM: CT HEAD WITHOUT AND WITH CONTRAST TECHNIQUE: Contiguous axial images were obtained from the base of the skull through the vertex without and with intravenous contrast CONTRAST: 54m ISOVUE-300 IOPAMIDOL (ISOVUE-300) INJECTION 61% COMPARISON: Multiple priors, most recent CT head 08/26/2015. FINDINGS: Generalized atrophy. Hypoattenuation of white matter representing chronic microvascular ischemic change. No hydrocephalus or extra-axial fluid. Residual hyperdense focus in the LEFT frontal  lobe, with considerable improvement since previous CT scans. See image 19 series 2. Post infusion, there is variable, but definite enhancement concerning for a solid mass, surrounded by cystic change; see for instance image 18 series 3. There is some enhancement of the cystic change into the parenchyma which could represent granulation tissue. The nodular area of enhancement, measuring 20 x 16 x 17 mm, is favored to represent a melanoma metastasis. No other similar lesions. No midline shift. Mild surrounding vasogenic edema is suspected, extending to the centrum semiovale as well as the frontal lobe, all on the LEFT. Cortical lucency, LEFT parietal bone, not clearly representing a venous [place]. See image 27 series 4. No skull base lesions. No sinus or mastoid disease. BILATERAL cataract extraction with LEFT globe banding. IMPRESSION: 20 x 16 x 17 mm superficial LEFT frontal hyperdense lesion, showing postcontrast enhancement, lying within the epicenter of the previous hemorrhage, concerning for a solitary melanoma metastasis. Interval resolution of the previous intraparenchymal hemorrhage, but residual vasogenic edema may be secondary to the enhancing tumor. Peripheral enhancement surrounding the hypoattenuating cavity occupied previously by the hematoma, is favored to represent granulation tissue, rather than tumor. These results will be called to the ordering clinician or representative by the Radiologist Assistant, and communication documented in the PACS or zVision Dashboard. Electronically Signed By: JStaci RighterM.D. On: 10/04/2015 17:29   Nm Pet Image Initial (pi) Skull Base To Thigh  09/16/2015 CLINICAL DATA: Initial treatment strategy for lung mass. EXAM: NUCLEAR MEDICINE PET SKULL BASE TO THIGH TECHNIQUE: 9.5 mCi F-18 FDG was injected intravenously. Full-ring PET imaging was performed from the skull base to thigh after the radiotracer. CT data was obtained and used for attenuation correction and  anatomic localization. FASTING BLOOD GLUCOSE: Value: 107 mg/dl COMPARISON: CT chest 09/01/2015. FINDINGS: NECK No hypermetabolic lung of the neck. CT images show no acute findings. CHEST Inferior lingular nodule measures 2.2 x 2.5 cm (series 8, image 42) with an SUV max of 17.7. 6 mm right upper lobe nodule (series 8, image 20) has an SUV max 2.0. 3 mm nodule along the minor fissure (image 33), too small for PET resolution. No hypermetabolic mediastinal, hilar or axillary lymph nodes. There is focal hypermetabolism in the right lobe of the thyroid, which is slightly asymmetrically enlarged with an SUV max 6.8. Faint soft tissue nodule overlying the distal left clavicle measures  9 mm (CT image 51) with an SUV max of 10.2. 1.7 cm low-attenuation extrapleural lesion in the posteromedial mid right hemi thorax (series 4, image 83), shows no abnormal hypermetabolism and may represent a neurogenic tumor. No pericardial effusion. There may be a tiny right pleural effusion. ABDOMEN/PELVIS An 8 mm nodule in or adjacent to the left paraspinous musculature at the L1-2 level measures 8 mm (series 4, image 128) with an SUV max of 6.9. Subcutaneous lesion overlying the right gluteus musculature measures 8 mm (image 170) with an SUV max of 4.0. No abnormal hypermetabolism in the liver, adrenal glands, spleen or pancreas. No hypermetabolic lymph nodes. CT images show the liver, gallbladder, adrenal glands to be unremarkable. Low-attenuation lesions in the kidneys measure up to approximately 1.7 cm and are difficult to further characterize due to size and/or lack of post-contrast imaging. Kidneys, spleen, pancreas, stomach and bowel are otherwise grossly unremarkable. SKELETON Slightly increased marrow attenuation is seen in the proximal right humerus (series 4, images 60-70). This is best seen on soft tissue windows. Associated SUV max of 10.5. There is cortical hypermetabolism along the greater trochanter of the right femur, where  there may be slight cortical thickening (series 4, image 196), SUV max 6.1. No additional abnormal osseous hypermetabolism. IMPRESSION: 1. Hypermetabolic lingular nodule with hypermetabolic right upper lobe nodule, subcutaneous nodules and osseous lesions. Distribution is not typical of primary bronchogenic carcinoma, although not excluded. Melanoma could have this appearance. Consider MR of the proximal right humerus without and with contrast in further evaluation. Percutaneous tissue sampling of lingular lesion may be useful, as clinically indicated. 2. Hypermetabolic right thyroid lesion. Ultrasound is recommended in further evaluation as malignancy cannot be excluded. Electronically Signed By: Lorin Picket M.D. On: 09/16/2015 10:01   US Biopsy  09/21/2015 INDICATION: History of melanoma, now with hypermetabolic left infarct follicular lymph node worrisome for metastatic disease. Please perform ultrasound-guided lymph node biopsy for tissue diagnostic purposes. EXAM: ULTRASOUND GUIDED LYMPH NODE BIOPSY COMPARISON: PET-CT - 09/16/2015 MEDICATIONS: None ANESTHESIA/SEDATION: Moderate (conscious) sedation was employed during this procedure. A total of Versed 1.5 mg and Fentanyl 50 mcg was administered intravenously. Moderate Sedation Time: 16 minutes. The patient's level of consciousness and vital signs were monitored continuously by radiology nursing throughout the procedure under my direct supervision. COMPLICATIONS: None immediate. TECHNIQUE: Informed written consent was obtained from the patient after a discussion of the risks, benefits and alternatives to treatment. Questions regarding the procedure were encouraged and answered. Initial ultrasound scanning demonstrated an approximately 1.4 x 0.7 cm nodule within the subcutaneous tissues inferior to the lateral aspect of the left clavicle (image 2) which correlates with the hyper metabolic left infraclavicular lymph node seen on preceding PET-CT scan.  An ultrasound image was saved for documentation purposes. The procedure was planned. A timeout was performed prior to the initiation of the procedure. The operative was prepped and draped in the usual sterile fashion, and a sterile drape was applied covering the operative field. A timeout was performed prior to the initiation of the procedure. Local anesthesia was provided with 1% lidocaine with epinephrine. Under direct ultrasound guidance, an 18 gauge core needle device was utilized to obtain to obtain 6 core needle samples of the dominant hypermetabolic left infraclavicular lymph node. The samples were placed in saline and submitted to pathology. The needle was removed and hemostasis was achieved with manual compression. Post procedure scan was negative for significant hematoma. A dressing was placed. The patient tolerated the procedure well without immediate postprocedural  complication. IMPRESSION: Technically successful ultrasound guided biopsy of dominant hypermetabolic left infraclavicular lymph node. Electronically Signed By: Sandi Mariscal M.D. On: 09/21/2015 12:44     IMPRESSION: Metastatic melanoma to the lingula, right upper lobe,left supraclavicular node, and brain.  PLAN: Dr. Tammi Klippel discusses the pathology findings, imaging studies, reviews the recommendations to proceed with surgical resection and stereotactic radiosurgery. He discusses the sequence of preop SRS. The patient  met with Dr. Vertell Limber on Monday and had [place] on 10/13/15. The risks, benefits, short and long term effects are reviewed with the patient. He is interested in moving forward, he will continue systemic therapy with Dr. Burr Medico on [place] as well. At the end of the conversation all questions were answered to the satisfaction of the patient and his daughter.     The patient would benefit from surgical resection of the brain metastasis.* In addition, the patient would potentially benefit from radiotherapy.* The options include  whole brain irradiation versus stereotactic radiosurgery. There are pros and cons associated with each of these potential treatment options. Whole brain radiotherapy would treat the known metastatic deposits and help provide some reduction of risk for future brain metastases. However, whole brain radiotherapy carries potential risks including hair loss, subacute somnolence, and neurocognitive changes including a possible reduction in short-term memory. Whole brain radiotherapy also may carry a lower likelihood of tumor control at the treatment sites because of the low-dose used and the inherent radioresistance of melanoma to low dose per fraction. Stereotactic radiosurgery carries a higher likelihood for local tumor control at the targeted sites with lower associated risk for neurocognitive changes such as memory loss.* However, the use of stereotactic radiosurgery in this setting may leave the patient at increased risk for new brain metastases elsewhere in the brain as high as 50-60%. Accordingly, patients who receive stereotactic radiosurgery in this setting should undergo ongoing surveillance imaging with brain MRI more frequently in order to identify and treat new small brain metastases before they become symptomatic. Stereotactic radiosurgery does carry some different risks, including a risk of radionecrosis.  PLAN: Today, I reviewed the findings and workup thus far with the patient. We discussed the dilemma regarding whole brain radiotherapy versus stereotactic radiosurgery. We discussed the pros and cons of each. We also discussed the logistics and delivery of each. We reviewed the results associated with each of the treatments described above. The patient seems to understand the treatment options and would like to proceed with stereotactic radiosurgery.  In terms of timing of the stereotactic radiosurgery, evidence suggests that risk of radionecrosis and leptomeningeal recurrence is lower when used in  the pre-operative setting as opposed to post-operative SRS.*  Tentatively, the patient will be set up for [place] today, with treatment on 7/5, and Surgical resection on 7/7.    Marchia Meiers. Vertell Limber, MD (and Tyler Pita, MD)    *References:  1: Patchell RA, Tibbs PA, Helene Shoe, [address], Lyman, [place], Hurst JS, Young B. A randomized trial of surgery in the treatment of single metastases to the brain. [place] Feb 22;322(8):494-500. PubMed PMID: 2122482.   2: Patchell RA, Tibbs PA, Regine WF, Jonita Albee, Mohiuddin M, Arrie Eastern, [place], [place], Young B. Postoperative radiotherapy in the treatment of single metastases to the brain: a randomized trial. JAMA. 1998 Nov 4;280(17):1485-9. PubMed PMID: 5003704.   3: Erlene Senters, Wenda Low, Hess KR, Tomie China, Lang FF, [place] Darletta Moll, [place], Swint JM, Shiu AS, Maor [facility], Olen Pel  [place]. Neurocognition in patients with brain metastases treated with radiosurgery or radiosurgery plus whole-brain irradiation: a randomised controlled trial. Lancet Oncol. 2009 Nov;10(11):1037-44. doi: 10.1016/S1470-2045(09)70263-3. Epub 2009 Oct 2. PubMed PMID: 74128786.  4: Weyman Rodney, Estill Dooms, Coralee Pesa, Crocker IR, Lorie Phenix, Charlesetta Garibaldi, Press RH, Tanya Nones, Lindsay NM, Wait SD, Higinio Plan, Shu HG, [place]. Comparing Preoperative With Postoperative Stereotactic Radiosurgery for Resectable Brain Metastases: A Multi-institutional Analysis. Neurosurgery. 2015 Nov 2. [Epub ahead of print] PubMed PMID: [id number].

## 2015-10-15 NOTE — Progress Notes (Signed)
Awake, alert, conversant, nauseated.  MAEW with good strength.  Speech clear and fluent.  Doing well following craniotomy for tumor.

## 2015-10-15 NOTE — Op Note (Signed)
10/15/2015  10:39 AM  PATIENT:  David [name]  75 y.o. male  PRE-OPERATIVE DIAGNOSIS:  Melanoma metastasis to brain (Left frontal region)  POST-OPERATIVE DIAGNOSIS:  Melanoma metastasis to brain (Left frontal region)  PROCEDURE:  Procedure(s) with comments: LEFT FRONTAL CRANIOTOMY TUMOR EXCISION with Curve (N/A) - CRANIOTOMY TUMOR EXCISION APPLICATION OF CRANIAL NAVIGATION (N/A) with microdissection  SURGEON:  Surgeon(s) and Role:    * Erline Levine, MD - Primary  PHYSICIAN ASSISTANT:   ASSISTANTS: P[place], RN   ANESTHESIA:   general  EBL:  Total I/O In: 1600 [I.V.:1600] Out: 550 [Urine:400; Blood:150]  BLOOD ADMINISTERED:none  DRAINS: none   LOCAL MEDICATIONS USED:  LIDOCAINE   SPECIMEN:  Excision  DISPOSITION OF SPECIMEN:  PATHOLOGY  COUNTS:  YES  TOURNIQUET:  * No tourniquets in log *  DICTATION: Patient is 75 year old man with melanoma, which has metastasized to the brain.  He initially presented with a hemorrhage and aphasia.  This has improved.  It was elected to perform SRS to the tumor with craniotomy and tumor resection.   He had a BrainLab CT for surgical localization of tumor, as he has a pacemaker and is unable to have an MRI.  Procedure:  Following smooth intubation, patient was placed in right semi-lateral position with blanket roll.  Head was placed in pins and left frontal scalp was shaved and prepped and draped in usual sterile fashion after BrainLab navigation was localized to map tumor location.  Area of planned incision was infiltrated with lidocaine. A curvilinear pre-auricular incision was made and carried through temporalis fascia and muscle to expose calvarium.  Skull flap was elevated exposing the dura directly overlying the brain mass.  Dura was opened.  A corticotomy was created in the frontal lobe and carried to remove the brain metastasis.  The mass did not come to the surface, but there was some bloody discoloration where it came to the surface.   I made an opening between two gyri to expose the tumor.  The Curve and microscope were used to confirm extent of tumor resection.  Hemostasis was assured with irrigation and cotton balls.  Hemostasis was assured and the tumor cavity was lined with Surgicell. The dura was closed with 4-0 neurilon sutures along with tack up neurilon sutures and a dural patch graft. The bone flap was replaced with plates, the fascia and galea were closed with 2-0 vicryl sutures and the skin was re approximated with staples.  A sterile occlusive dressing was placed.  Patient was returned to a supine position and taken out of head pins, then extubated in the operating room, having tolerated surgery well.  Counts were correct at the end of the case.  PLAN OF CARE: Admit to inpatient   PATIENT DISPOSITION:  PACU - hemodynamically stable.   Delay start of Pharmacological VTE agent (>24hrs) due to surgical blood loss or risk of bleeding: yes

## 2015-10-15 NOTE — Anesthesia Postprocedure Evaluation (Signed)
Anesthesia Post Note  Patient: David [name]  Procedure(s) Performed: Procedure(s) (LRB): LEFT FRONTAL CRANIOTOMY TUMOR EXCISION with Curve (N/A) APPLICATION OF CRANIAL NAVIGATION (N/A)  Patient location during evaluation: PACU Anesthesia Type: General Level of consciousness: awake and alert Pain management: pain level controlled Vital Signs Assessment: post-procedure vital signs reviewed and stable Respiratory status: spontaneous breathing, nonlabored ventilation, respiratory function stable and patient connected to nasal cannula oxygen Cardiovascular status: blood pressure returned to baseline and stable Postop Assessment: no signs of nausea or vomiting Anesthetic complications: no    Last Vitals:  Filed Vitals:   10/15/15 1300 10/15/15 1330  BP: 146/86 142/90  Pulse: 75 75  Temp:    Resp: 17 16    Last Pain:  Filed Vitals:   10/15/15 1506  PainSc: 4                  [name]

## 2015-10-16 ENCOUNTER — Inpatient Hospital Stay (HOSPITAL_COMMUNITY): Payer: Medicare Other

## 2015-10-16 MED ORDER — IOPAMIDOL (ISOVUE-300) INJECTION 61%
INTRAVENOUS | Status: AC
Start: 2015-10-16 — End: 2015-10-16
  Administered 2015-10-16: 75 mL via INTRAVENOUS
  Filled 2015-10-16: qty 75

## 2015-10-16 MED ORDER — FAMOTIDINE 20 MG PO TABS
20.0000 mg | ORAL_TABLET | Freq: Two times a day (BID) | ORAL | Status: DC
  Administered 2015-10-16 – 2015-10-21 (×11): 20 mg via ORAL
  Filled 2015-10-16 (×10): qty 1

## 2015-10-16 NOTE — Progress Notes (Signed)
RT placed patient on CPAP HS. 2L O2 bleed in needed. Patient tolerating well.  ?

## 2015-10-16 NOTE — Progress Notes (Signed)
Patient ID: David [name], male   DOB: 05-Aug-1940, 75 y.o.   MRN: [id number] Subjective: Patient reports no real headache. No numbness tingling or weakness. No nausea vomiting  Objective: Vital signs in last 24 hours: Temp:  [97.4 F (36.3 C)-98.8 F (37.1 C)] 98.8 F (37.1 C) (07/08 0353) Pulse Rate:  [74-82] 75 (07/07 2200) Resp:  [12-23] 18 (07/07 2200) BP: (128-158)/(80-102) 130/80 mmHg (07/07 2200) SpO2:  [94 %-98 %] 95 % (07/07 2200) Arterial Line BP: (116-195)/(82-152) 116/112 mmHg (07/07 2200)  Intake/Output from previous day: 07/07 0701 - 07/08 0700 In: 2192.5 [I.V.:1937.5; IV Piggyback:255] Out: 2200 [GYJEH:6314; Emesis/NG output:100; Blood:150] Intake/Output this shift:    Neurologic: Grossly normal , does have significant. We'll go edema on the left Lab Results: Lab Results  Component Value Date   WBC 5.9 10/13/2015   HGB 13.0 10/13/2015   HCT 39.2 10/13/2015   MCV 92.9 10/13/2015   PLT 211 10/13/2015   Lab Results  Component Value Date   INR 1.21 09/21/2015   BMET Lab Results  Component Value Date   NA 137 10/13/2015   K 4.6 10/13/2015   CL 107 10/13/2015   CO2 22 10/13/2015   GLUCOSE 125* 10/13/2015   BUN 31* 10/13/2015   CREATININE 1.24 10/13/2015   CALCIUM 9.6 10/13/2015    Studies/Results: Ct Head W Wo Contrast  10/16/2015  CLINICAL DATA:  Brain metastasis. History of melanoma, brain cancer and stroke. EXAM: CT HEAD WITHOUT AND WITH CONTRAST TECHNIQUE: Contiguous axial images were obtained from the base of the skull through the vertex without and with intravenous contrast CONTRAST:  75 cc Isovue 370 COMPARISON:  CT HEAD October 13, 2015 FINDINGS: INTRACRANIAL CONTENTS: Interval debulking of LEFT frontal lobe mass with faint similar linear enhancing component along the deep margin. Small amount of new intraventricular blood products. Similar LEFT frontal edema. Minimal LEFT frontal extra-axial pneumocephalus and small amount of extra-axial blood products  deep to the surgical defect. Similar LEFT frontal mass effect. 3 mm LEFT to RIGHT midline shift. Basal cisterns are patent. No acute large vascular territory infarcts. ORBITS: Status post bilateral ocular lens implant, LEFT scleral banding. SINUSES: The mastoid aircells and included paranasal sinuses are well-aerated. SKULL/SOFT TISSUES: Interval LEFT frontotemporal craniotomy with extensive scalp soft tissue swelling extending to LEFT periorbital soft tissues. Overlying skin staples. IMPRESSION: Interval LEFT frontotemporal craniotomy for debulking of LEFT frontal lobe metastasis. Similar local mass-effect and 3 mm LEFT-to-RIGHT midline shift. Small amount of intraventricular hemorrhage.  No hydrocephalus. Electronically Signed   By: Elon Alas M.D.   On: 10/16/2015 06:15   Dg Chest Port 1 View  10/15/2015  CLINICAL DATA:  Central line placement EXAM: PORTABLE CHEST 1 VIEW COMPARISON:  08/23/2015 FINDINGS: Cardiomediastinal silhouette is stable. 3 leads cardiac pacemaker is unchanged in position. No infiltrate or pulmonary edema. There is right IJ central line with tip in SVC. No pneumothorax. Nodular mass in left lower lobe again noted measures 3.8 cm. IMPRESSION: No infiltrate or pulmonary edema. There is right IJ central line with tip in SVC. No pneumothorax. Nodular mass in left lower lobe again noted measures 3.8 cm. Electronically Signed   By: Lahoma Crocker M.D.   On: 10/15/2015 14:48    Assessment/Plan: Doing well postop day 1. CT scan postoperatively looks good. Mobilize today.   LOS: 1 day    Labrandon Knoch S 10/16/2015, 7:21 AM

## 2015-10-17 ENCOUNTER — Inpatient Hospital Stay (HOSPITAL_COMMUNITY): Payer: Medicare Other

## 2015-10-17 MED ORDER — PROMETHAZINE HCL 25 MG/ML IJ SOLN
12.5000 mg | Freq: Four times a day (QID) | INTRAMUSCULAR | Status: DC | PRN
  Administered 2015-10-17 – 2015-10-18 (×3): 12.5 mg via INTRAVENOUS
  Filled 2015-10-17 (×3): qty 1

## 2015-10-17 NOTE — Progress Notes (Signed)
Patient ID: David [name], male   DOB: Apr 17, 1940, 75 y.o.   MRN: [id number] Subjective: Patient reports he is doing well. No real headache.  Objective: Vital signs in last 24 hours: Temp:  [97.6 F (36.4 C)-98.9 F (37.2 C)] 97.9 F (36.6 C) (07/09 0400) Pulse Rate:  [74-82] 79 (07/09 0700) Resp:  [11-21] 14 (07/09 0700) BP: (117-151)/(64-106) 150/101 mmHg (07/09 0700) SpO2:  [91 %-97 %] 97 % (07/09 0700)  Intake/Output from previous day: 07/08 0701 - 07/09 0700 In: 495 [P.O.:60; I.V.:225; IV Piggyback:210] Out: 650 [Urine:650] Intake/Output this shift:    Neurologic: Grossly normal  Lab Results: Lab Results  Component Value Date   WBC 5.9 10/13/2015   HGB 13.0 10/13/2015   HCT 39.2 10/13/2015   MCV 92.9 10/13/2015   PLT 211 10/13/2015   Lab Results  Component Value Date   INR 1.21 09/21/2015   BMET Lab Results  Component Value Date   NA 137 10/13/2015   K 4.6 10/13/2015   CL 107 10/13/2015   CO2 22 10/13/2015   GLUCOSE 125* 10/13/2015   BUN 31* 10/13/2015   CREATININE 1.24 10/13/2015   CALCIUM 9.6 10/13/2015    Studies/Results: Ct Head W Wo Contrast  10/16/2015  CLINICAL DATA:  Brain metastasis. History of melanoma, brain cancer and stroke. EXAM: CT HEAD WITHOUT AND WITH CONTRAST TECHNIQUE: Contiguous axial images were obtained from the base of the skull through the vertex without and with intravenous contrast CONTRAST:  75 cc Isovue 370 COMPARISON:  CT HEAD October 13, 2015 FINDINGS: INTRACRANIAL CONTENTS: Interval debulking of LEFT frontal lobe mass with faint similar linear enhancing component along the deep margin. Small amount of new intraventricular blood products. Similar LEFT frontal edema. Minimal LEFT frontal extra-axial pneumocephalus and small amount of extra-axial blood products deep to the surgical defect. Similar LEFT frontal mass effect. 3 mm LEFT to RIGHT midline shift. Basal cisterns are patent. No acute large vascular territory infarcts. ORBITS:  Status post bilateral ocular lens implant, LEFT scleral banding. SINUSES: The mastoid aircells and included paranasal sinuses are well-aerated. SKULL/SOFT TISSUES: Interval LEFT frontotemporal craniotomy with extensive scalp soft tissue swelling extending to LEFT periorbital soft tissues. Overlying skin staples. IMPRESSION: Interval LEFT frontotemporal craniotomy for debulking of LEFT frontal lobe metastasis. Similar local mass-effect and 3 mm LEFT-to-RIGHT midline shift. Small amount of intraventricular hemorrhage.  No hydrocephalus. Electronically Signed   By: Elon Alas M.D.   On: 10/16/2015 06:15   Dg Chest Port 1 View  10/15/2015  CLINICAL DATA:  Central line placement EXAM: PORTABLE CHEST 1 VIEW COMPARISON:  08/23/2015 FINDINGS: Cardiomediastinal silhouette is stable. 3 leads cardiac pacemaker is unchanged in position. No infiltrate or pulmonary edema. There is right IJ central line with tip in SVC. No pneumothorax. Nodular mass in left lower lobe again noted measures 3.8 cm. IMPRESSION: No infiltrate or pulmonary edema. There is right IJ central line with tip in SVC. No pneumothorax. Nodular mass in left lower lobe again noted measures 3.8 cm. Electronically Signed   By: Lahoma Crocker M.D.   On: 10/15/2015 14:48    Assessment/Plan: Seems to be doing very well. To the floor today.   LOS: 2 days    Durward Matranga S 10/17/2015, 8:29 AM

## 2015-10-17 NOTE — Progress Notes (Signed)
RT placed patient on CPAP with home mask. 2L O2 bleed in needed. Patient tolerating well.

## 2015-10-17 NOTE — Progress Notes (Addendum)
Pt had episode of nausea/vomiting without any changes in neuro assessments, pt has bowel sounds in all quadrants.   MD paged, order for 12.5 mg IV Phenergan ordered and administered.  Pt is drowsy but neuro intact, and without any subsequent episodes of vomiting.  Pt rates his headache at 3/10. MD aware.

## 2015-10-17 NOTE — Progress Notes (Deleted)
Patient ID: David [name], male   DOB: 1940/11/18, 75 y.o.   MRN: [id number] Called because headache, n/v. Neuro intact as per RN. LAST ct head was 8 days ao  To repeat one tonite

## 2015-10-17 NOTE — Progress Notes (Signed)
Patient ID: David [name], male   DOB: 12-10-1940, 75 y.o.   MRN: [id number] Ct head done because headache,n/v  Neurology intact. Ct head shows postop chnages wit edema ,minimal sdh, mild shift. Continue present treatment

## 2015-10-18 ENCOUNTER — Encounter (HOSPITAL_COMMUNITY): Payer: Self-pay | Admitting: Neurosurgery

## 2015-10-18 ENCOUNTER — Encounter: Payer: Self-pay | Admitting: Internal Medicine

## 2015-10-18 DIAGNOSIS — N400 Enlarged prostate without lower urinary tract symptoms: Secondary | ICD-10-CM | POA: Insufficient documentation

## 2015-10-18 DIAGNOSIS — C7800 Secondary malignant neoplasm of unspecified lung: Secondary | ICD-10-CM | POA: Insufficient documentation

## 2015-10-18 DIAGNOSIS — F331 Major depressive disorder, recurrent, moderate: Secondary | ICD-10-CM | POA: Insufficient documentation

## 2015-10-18 MED ORDER — DEXAMETHASONE SODIUM PHOSPHATE 10 MG/ML IJ SOLN
10.0000 mg | Freq: Once | INTRAMUSCULAR | Status: AC
  Administered 2015-10-18: 10 mg via INTRAVENOUS
  Filled 2015-10-18: qty 1

## 2015-10-18 MED ORDER — LEVETIRACETAM 500 MG PO TABS
500.0000 mg | ORAL_TABLET | Freq: Two times a day (BID) | ORAL | Status: DC
  Administered 2015-10-18 – 2015-10-21 (×6): 500 mg via ORAL
  Filled 2015-10-18 (×7): qty 1

## 2015-10-18 NOTE — Progress Notes (Signed)
  Radiation Oncology         [phone]) [phone] ________________________________  Name: David [name] MRN: [id number]  Date: 10/13/2015  DOB: October 30, 1940   SPECIAL TREATMENT PROCEDURE   3D TREATMENT PLANNING AND DOSIMETRY: The patient's radiation plan was reviewed and approved by Dr. Vertell Limber from neurosurgery and radiation oncology prior to treatment. It showed 3-dimensional radiation distributions overlaid onto the planning CT/MRI image set. The [facility] for the target structures as well as the organs at risk were reviewed. The documentation of the 3D plan and dosimetry are filed in the radiation oncology EMR.   NARRATIVE: The patient was brought to the TrueBeam stereotactic radiation treatment machine and placed supine on the CT couch. The head frame was applied, and the patient was set up for stereotactic radiosurgery. Neurosurgery was present for the set-up and delivery   SIMULATION VERIFICATION: In the couch zero-angle position, the patient underwent Exactrac imaging using the Brainlab system with orthogonal KV images. These were carefully aligned and repeated to confirm treatment position for each of the isocenters. The Exactrac snap film verification was repeated at each couch angle.   SPECIAL TREATMENT PROCEDURE: The patient received stereotactic radiosurgery to the following target:  PTV1 Lt Frontal lesion 20 mm target was treated using 4 Arcs to a prescription dose of 14 Gy. ExacTrac Snap verification was performed for each couch angle.   STEREOTACTIC TREATMENT MANAGEMENT: Following delivery, the patient was transported to nursing in stable condition and monitored for possible acute effects. Vital signs were recorded . The patient tolerated treatment without significant acute effects, and was discharged to home in stable condition.  PLAN: Follow-up in one month.   ------------------------------------------------  Jodelle Gross, MD, PhD

## 2015-10-18 NOTE — Evaluation (Signed)
Occupational Therapy Evaluation Patient Details Name: David [name] MRN: [id number] DOB: [birth year] Today's Date: 10/18/2015    History of Present Illness 75 yo male s/p L frontal craniotomy tumor excision with curve PMH: HTN OSA, CKD PPM L frontal hemorrhage with L to R shift , L lung mass, 3 skin melanoma, MI, metastatic malignant melanoma with 20 x 16 x 17 mm superficial left frontal lesion   Clinical Impression   Patient is s/p L frontal craniotomy tumore excision surgery resulting in functional limitations due to the deficits listed below (see OT problem list). PTA was living at ALF with wife MOD I. No family present during evaluation.  Patient will benefit from skilled OT acutely to increase independence and safety with ADLS to allow discharge [place]. Pt demonstrates R drift in RW during session and lack awareness. Balance to be further assessed and sequencing adl task.      Follow Up Recommendations  Home health OT;Supervision/Assistance - 24 hour    Equipment Recommendations  Other (comment) (RW)    Recommendations for Other Services       Precautions / Restrictions Precautions Precautions: Fall      Mobility Bed Mobility Overal bed mobility: Needs Assistance Bed Mobility: Supine to Sit     Supine to sit: Min assist     General bed mobility comments: incr time exiting L side  Transfers Overall transfer level: Needs assistance Equipment used: Rolling walker (2 wheeled) Transfers: Sit to/from Stand Sit to Stand: Min assist;From elevated surface         General transfer comment: surface elevated and allowed incr time to static stand    Balance Overall balance assessment: Needs assistance Sitting-balance support: Bilateral upper extremity supported;Feet supported Sitting balance-Leahy Scale: Fair     Standing balance support: Single extremity supported;During functional activity Standing balance-Leahy Scale: Fair Standing balance comment: L Ue on sink  during oral care                            ADL Overall ADL's : Needs assistance/impaired Eating/Feeding: Set up;Sitting Eating/Feeding Details (indicate cue type and reason): eating crackers at the end of session Grooming: Wash/dry hands;Wash/dry face;Oral care;Min guard;Standing Grooming Details (indicate cue type and reason): pt needed (A) to work foot pedals to sink but was able to brush teeth. pt states "oh my head" when seeing himself in the mirror for the first time.                  Toilet Transfer: Ambulation;RW;Moderate Print production planner Details (indicate cue type and reason): simulated. cues for RW and safety in RW         Functional mobility during ADLs: Minimal assistance;Rolling walker General ADL Comments: Pt demonstrates R sided drift in RW this session. pt attempting to abandon RW x2 during session. pt needed cues to remain inside RW. Pt demonstrates balance deficits and asked to stand in front of staff pictures and locate all MD related to care. pt then transfered to recliner placed behind patient. Pt pushed back to room in recliner. pt provided snack RN setup for patient in recliner. pt reports generalized fatigue. Pt educated that PT would come later today     Vision Vision Assessment?: No apparent visual deficits Additional Comments: edema over L eye noted   Perception     Praxis      Pertinent Vitals/Pain Pain Assessment:  (nausea reported not pain)     Hand Dominance Right  Extremity/Trunk Assessment Upper Extremity Assessment Upper Extremity Assessment: Overall WFL for tasks assessed   Lower Extremity Assessment Lower Extremity Assessment: Defer to PT evaluation   Cervical / Trunk Assessment Cervical / Trunk Assessment: Normal   Communication Communication Communication: Expressive difficulties   Cognition Arousal/Alertness: Awake/alert Behavior During Therapy: Flat affect Overall Cognitive Status: Within Functional  Limits for tasks assessed                     General Comments       Exercises       Shoulder Instructions      Home Living Family/patient expects to be discharged to:: Assisted living [facility]) Living Arrangements: Spouse/significant other                               Additional Comments: retired Pharmacist, community, [facility]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      Prior Functioning/Environment Level of Independence: Independent             OT Diagnosis: Generalized weakness;Acute pain   OT Problem List: Decreased strength;Decreased activity tolerance;Impaired balance (sitting and/or standing);Decreased safety awareness;Decreased knowledge of use of DME or AE;Decreased knowledge of precautions;Pain   OT Treatment/Interventions: Self-care/ADL training;Therapeutic exercise;DME and/or AE instruction;Therapeutic activities;Patient/family education;Balance training    OT Goals(Current goals can be found in the care plan section) Acute Rehab OT Goals Patient Stated Goal: none stated at this time. Did request to have feet down in recliner for comfort OT Goal Formulation: With patient Time For Goal Achievement: 11/01/15 Potential to Achieve Goals: Good  OT Frequency: Min 3X/week   Barriers to D/C:            Co-evaluation              End of Session Equipment Utilized During Treatment: Gait belt;Rolling walker Nurse Communication: Mobility status;Precautions  Activity Tolerance: Patient tolerated treatment well Patient left: in chair;with call bell/phone within reach;with chair alarm set   Time: [phone] OT Time Calculation (min): 18 min Charges:  OT General Charges $OT Visit: 1 Procedure OT Evaluation $OT Eval Moderate Complexity: 1 Procedure G-Codes:    Parke Poisson B 2015-10-22, 10:40 AM  Jeri Modena   OTR/L Pager: [phone] Office: [phone] .

## 2015-10-18 NOTE — Care Management Important Message (Signed)
Important Message  Patient Details  Name: David [name] MRN: [id number] Date of Birth: [birth year]   Medicare Important Message Given:  Yes    Loann Quill 10/18/2015, 8:24 AM

## 2015-10-18 NOTE — Progress Notes (Signed)
RT placed patient on CPAP HS. Patient has home mask. 2L O2 bleed in. Patient tolerating well.

## 2015-10-18 NOTE — Progress Notes (Signed)
  Radiation Oncology         [phone]) [phone] ________________________________  Name: David [name] MRN: [id number]  Date: 10/13/2015  DOB: 24-Sep-1940  End of Treatment Note  Diagnosis:      ICD-9-CM ICD-10-CM   1. Metastasis to brain [facility]) 198.3 C79.31         Indication for treatment:  palliative       Radiation treatment dates:   10/13/15  Site/dose:    PTV1 Lt Frontal lesion 20 mm target was treated using 4 Arcs to a prescription dose of 14 Gy. ExacTrac Snap verification was performed for each couch angle.   Narrative: The patient tolerated radiation treatment well.   There were no signs of acute toxicity after treatment.  Plan: The patient has completed radiation treatment. The patient will return to radiation oncology clinic for routine followup in one month. I advised the patient to call or return sooner if they have any questions or concerns related to their recovery or treatment. ________________________________  ------------------------------------------------  Jodelle Gross, MD, PhD

## 2015-10-18 NOTE — Progress Notes (Signed)
Subjective: Patient reports "I vomited again this morning"  Objective: Vital signs in last 24 hours: Temp:  [97.4 F (36.3 C)-98.6 F (37 C)] 97.4 F (36.3 C) (07/10 0347) Pulse Rate:  [74-82] 75 (07/10 0700) Resp:  [13-23] 16 (07/10 0700) BP: (133-168)/(78-107) 134/94 mmHg (07/10 0700) SpO2:  [90 %-97 %] 94 % (07/10 0700)  Intake/Output from previous day: 07/09 0701 - 07/10 0700 In: 210 [IV Piggyback:210] Out: 2 [Emesis/NG output:2] Intake/Output this shift:    Alert, conversant. Reports intermittent headache, seems to precede n/v episodes. CT yesterday showed no new bleeding, evolving post-op, stable. PEARL. MAEW. Has been up to bathroom with assistance. Poor appetite, but taking food without difficulty.   Lab Results: No results for input(s): WBC, HGB, HCT, PLT in the last 72 hours. BMET No results for input(s): NA, K, CL, CO2, GLUCOSE, BUN, CREATININE, CALCIUM in the last 72 hours.  Studies/Results: Ct Head Wo Contrast  10/17/2015  CLINICAL DATA:  75 year old male with intracranial hemorrhage and an episode of nausea vomiting. History of melanoma with brain metastases. EXAM: CT HEAD WITHOUT CONTRAST TECHNIQUE: Contiguous axial images were obtained from the base of the skull through the vertex without intravenous contrast. COMPARISON:  Multiple head CTs dating back to 10/04/2015 FINDINGS: Left frontal are craniotomy with postsurgical changes of debulking of the left frontal lobe mass. There is edema in the left frontal lobe. Small left frontal subdural hemorrhage measuring approximately 4 mm in thickness with no significant interval change compared to prior study. Small amount of blood products in the operative bed appears grossly similar in size. Small amount of subarachnoid hemorrhage is also noted in the left frontal lobe also similar in size compared to prior study. Small linear density extending into the uncal horn of the left lateral ventricle similar to prior study and likely  represents a small amount of blood or clot. No new hemorrhage identified. There has been interval decrease in the size of pneumocephalus compared to prior study. There is mild mass effect and approximately 3 mm left-to-right midline shift. The visualized paranasal sinuses and mastoid air cells are clear. No acute calvarial fracture. Left frontal and temporal extracranial soft tissue hematoma similar in size to prior study. Scalp surgical staples noted. IMPRESSION: Postsurgical changes of left frontal craniotomy and debulking surgery for left frontal mass. Small left frontal subdural, and subarachnoid hemorrhage as well as small amount of blood at the surgical bed appear similar in size compared to prior study. No new hemorrhage identified. Mild mass effect and approximately 3 mm left-to-right midline shift, stable from prior study. Electronically Signed   By: Anner Crete M.D.   On: 10/17/2015 23:08    Assessment/Plan:   LOS: 3 days  Managing nausea/vomiting. Await Dr. Ronnald Ramp' input re: floor vs. ICU today.    Verdis Prime 10/18/2015, 7:37 AM    I have examined the pt and reviewed the note. Some mild headache, no NTW, dressing dry, MAE, no aphasia. No flatus but good BS. Limit pos, decadron 10 IV x1, mobilize

## 2015-10-18 NOTE — Evaluation (Signed)
Physical Therapy Evaluation Patient Details Name: David [name] MRN: [id number] DOB: [birth year] Today's Date: 10/18/2015   History of Present Illness  75 yo male s/p L frontal craniotomy tumor excision with curve PMH: HTN OSA, CKD PPM L frontal hemorrhage with L to R shift , L lung mass, 3 skin melanoma, MI, metastatic malignant melanoma with 20 x 16 x 17 mm superficial left frontal lesion  Clinical Impression  Patient demonstrates deficits in functional mobility as indicated below. Will need continued skilled PT to address deficits and maximize function. Will see as indicated and progress as tolerated. Anticipate patient will be safe for d/c home with wife and recommend HHPT upon initial discharge.     Follow Up Recommendations Home health PT;Supervision for mobility/OOB    Equipment Recommendations  None recommended by PT    Recommendations for Other Services       Precautions / Restrictions Precautions Precautions: Fall Restrictions Weight Bearing Restrictions: No      Mobility  Bed Mobility Overal bed mobility: Needs Assistance Bed Mobility: Supine to Sit;Sit to Supine     Supine to sit: Supervision Sit to supine: Supervision   General bed mobility comments: No physical assist required, increased time to perform  Transfers Overall transfer level: Needs assistance Equipment used: Rolling walker (2 wheeled) Transfers: Sit to/from Stand Sit to Stand: Supervision         General transfer comment: no physical assist required to come to standing from bed or from toilet, patient did utilize grab bar in bathroom when standing  Ambulation/Gait Ambulation/Gait assistance: Min guard Ambulation Distance (Feet): 180 Feet Assistive device: Rolling walker (2 wheeled) (10 ft without device) Gait Pattern/deviations: Step-through pattern;Decreased stride length;Drifts right/left Gait velocity: decreased   General Gait Details: modest instability with gait, overall tolerated  mobility well without physical assist  Stairs            Wheelchair Mobility    Modified Rankin (Stroke Patients Only)       Balance     Sitting balance-Leahy Scale: Fair     Standing balance support: No upper extremity supported Standing balance-Leahy Scale: Fair Standing balance comment: able to perform functional tasks and pericare without assist                             Pertinent Vitals/Pain Pain Assessment: Faces Faces Pain Scale: Hurts a little bit Pain Location: head Pain Descriptors / Indicators: Sore Pain Intervention(s): Monitored during session    Home Living Family/patient expects to be discharged to:: Assisted living [facility]) Living Arrangements: Spouse/significant other               Additional Comments: retired Pharmacist, community, [facility]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    Prior Function Level of Independence: Independent               Hand Dominance   Dominant Hand: Right    Extremity/Trunk Assessment   Upper Extremity Assessment: Overall WFL for tasks assessed           Lower Extremity Assessment: Overall WFL for tasks assessed      Cervical / Trunk Assessment: Normal  Communication   Communication: Expressive difficulties  Cognition Arousal/Alertness: Awake/alert Behavior During Therapy: Flat affect Overall Cognitive Status: Within Functional Limits for tasks assessed                      General Comments  Exercises        Assessment/Plan    PT Assessment Patient needs continued PT services  PT Diagnosis Difficulty walking   PT Problem List Decreased strength;Decreased activity tolerance;Decreased balance;Decreased mobility;Decreased coordination  PT Treatment Interventions DME instruction;Gait training;Stair training;Functional mobility training;Therapeutic activities;Therapeutic exercise;Balance training;Patient/family education   PT Goals (Current goals can be found in the Care  Plan section) Acute Rehab PT Goals Patient Stated Goal: none stated at this time. Did request to have feet down in recliner for comfort PT Goal Formulation: With patient Time For Goal Achievement: 11/01/15 Potential to Achieve Goals: Good    Frequency Min 3X/week   Barriers to discharge        Co-evaluation               End of Session Equipment Utilized During Treatment: Gait belt Activity Tolerance: Patient tolerated treatment well;Patient limited by fatigue Patient left: in bed;with call bell/phone within reach Nurse Communication: Mobility status         Time: 4562-5638 PT Time Calculation (min) (ACUTE ONLY): 17 min   Charges:   PT Evaluation $PT Eval Moderate Complexity: 1 Procedure     PT G CodesDuncan Dull 2015/11/15, 4:19 PM Alben Deeds, [place] DPT  [phone]

## 2015-10-19 ENCOUNTER — Telehealth: Payer: Self-pay | Admitting: *Deleted

## 2015-10-19 ENCOUNTER — Other Ambulatory Visit: Payer: Self-pay | Admitting: *Deleted

## 2015-10-19 ENCOUNTER — Encounter (HOSPITAL_COMMUNITY): Payer: Self-pay | Admitting: *Deleted

## 2015-10-19 MED ORDER — DEXAMETHASONE SODIUM PHOSPHATE 4 MG/ML IJ SOLN
4.0000 mg | Freq: Two times a day (BID) | INTRAMUSCULAR | Status: AC
  Administered 2015-10-19 – 2015-10-21 (×4): 4 mg via INTRAVENOUS
  Filled 2015-10-19 (×4): qty 1

## 2015-10-19 MED ORDER — POLYETHYLENE GLYCOL 3350 17 G PO PACK
17.0000 g | PACK | Freq: Every day | ORAL | Status: DC
  Administered 2015-10-19 – 2015-10-20 (×2): 17 g via ORAL
  Filled 2015-10-19 (×3): qty 1

## 2015-10-19 MED ORDER — LEVETIRACETAM 500 MG PO TABS: 500.0000 mg | ORAL_TABLET | Freq: Two times a day (BID) | ORAL | Status: DC

## 2015-10-19 NOTE — Telephone Encounter (Signed)
Received call from [facility] stating pt had craniotomy on 10/13/15 & has been on steroids-decadron tid.  They plan to decrease to bid tomorrow & then one on thurs & want to make sure this is OK with [name] to cont with immunotherapy this week.  Discusses with [name] & she will postpone treatment & will contact [name] with this info.

## 2015-10-19 NOTE — Progress Notes (Signed)
Patient ID: David [name], male   DOB: Jul 10, 1940, 75 y.o.   MRN: [id number] Subjective: Patient reports 4 small BMs, head and belly feeling somewhat better  Objective: Vital signs in last 24 hours: Temp:  [97.4 F (36.3 C)-98.7 F (37.1 C)] 97.7 F (36.5 C) (07/11 0400) Pulse Rate:  [75-82] 80 (07/11 0600) Resp:  [13-25] 16 (07/11 0600) BP: (122-164)/(83-117) 126/94 mmHg (07/11 0600) SpO2:  [91 %-100 %] 100 % (07/11 0600)  Intake/Output from previous day: 07/10 0701 - 07/11 0700 In: 905 [P.O.:680; I.V.:225] Out: 250 [Emesis/NG output:250] Intake/Output this shift:    Neurologic: Grossly normal  Lab Results: Lab Results  Component Value Date   WBC 5.9 10/13/2015   HGB 13.0 10/13/2015   HCT 39.2 10/13/2015   MCV 92.9 10/13/2015   PLT 211 10/13/2015   Lab Results  Component Value Date   INR 1.21 09/21/2015   BMET Lab Results  Component Value Date   NA 137 10/13/2015   K 4.6 10/13/2015   CL 107 10/13/2015   CO2 22 10/13/2015   GLUCOSE 125* 10/13/2015   BUN 31* 10/13/2015   CREATININE 1.24 10/13/2015   CALCIUM 9.6 10/13/2015    Studies/Results: Ct Head Wo Contrast  10/17/2015  CLINICAL DATA:  75 year old male with intracranial hemorrhage and an episode of nausea vomiting. History of melanoma with brain metastases. EXAM: CT HEAD WITHOUT CONTRAST TECHNIQUE: Contiguous axial images were obtained from the base of the skull through the vertex without intravenous contrast. COMPARISON:  Multiple head CTs dating back to 10/04/2015 FINDINGS: Left frontal are craniotomy with postsurgical changes of debulking of the left frontal lobe mass. There is edema in the left frontal lobe. Small left frontal subdural hemorrhage measuring approximately 4 mm in thickness with no significant interval change compared to prior study. Small amount of blood products in the operative bed appears grossly similar in size. Small amount of subarachnoid hemorrhage is also noted in the left frontal lobe  also similar in size compared to prior study. Small linear density extending into the uncal horn of the left lateral ventricle similar to prior study and likely represents a small amount of blood or clot. No new hemorrhage identified. There has been interval decrease in the size of pneumocephalus compared to prior study. There is mild mass effect and approximately 3 mm left-to-right midline shift. The visualized paranasal sinuses and mastoid air cells are clear. No acute calvarial fracture. Left frontal and temporal extracranial soft tissue hematoma similar in size to prior study. Scalp surgical staples noted. IMPRESSION: Postsurgical changes of left frontal craniotomy and debulking surgery for left frontal mass. Small left frontal subdural, and subarachnoid hemorrhage as well as small amount of blood at the surgical bed appear similar in size compared to prior study. No new hemorrhage identified. Mild mass effect and approximately 3 mm left-to-right midline shift, stable from prior study. Electronically Signed   By: Anner Crete M.D.   On: 10/17/2015 23:08    Assessment/Plan: Doing better, floor today   LOS: 4 days    Zaryiah Barz S 10/19/2015, 7:23 AM

## 2015-10-19 NOTE — Progress Notes (Signed)
Physical Therapy Treatment Patient Details Name: David [name] MRN: [id number] DOB: 1940/09/13 Today's Date: 10/19/2015    History of Present Illness 75 yo male s/p L frontal craniotomy tumor excision with curve PMH: HTN OSA, CKD PPM L frontal hemorrhage with L to R shift , L lung mass, 3 skin melanoma, MI, metastatic malignant melanoma with 20 x 16 x 17 mm superficial left frontal lesion    PT Comments    Patient seen for mobility progression. Patient performed functional tasks in room without need for assistive device. Patient then able to tolerate increased ambulation in hall, again without need for assistive device this session. Spoke with patient regarding increased mobility and encouraged to mobilize with staff. Patient receptive and appreciative. Will follow.  Follow Up Recommendations  Home health PT;Supervision for mobility/OOB     Equipment Recommendations  None recommended by PT    Recommendations for Other Services       Precautions / Restrictions Precautions Precautions: Fall Restrictions Weight Bearing Restrictions: No    Mobility  Bed Mobility Overal bed mobility: Needs Assistance Bed Mobility: Supine to Sit;Sit to Supine     Supine to sit: Supervision Sit to supine: Supervision   General bed mobility comments: No physical assist required, increased time to perform  Transfers Overall transfer level: Needs assistance Equipment used: None Transfers: Sit to/from Stand Sit to Stand: Supervision         General transfer comment: no use of assistive device, performed from bed and from toilet  Ambulation/Gait Ambulation/Gait assistance: Supervision Ambulation Distance (Feet): 410 Feet Assistive device: None Gait Pattern/deviations: Step-through pattern;Decreased stride length;Drifts right/left Gait velocity: decreased   General Gait Details: very modest instability with ambulation, cues for increased cadence   Stairs            Wheelchair  Mobility    Modified Rankin (Stroke Patients Only)       Balance   Sitting-balance support: No upper extremity supported Sitting balance-Leahy Scale: Good     Standing balance support: No upper extremity supported Standing balance-Leahy Scale: Fair Standing balance comment: no UE support required today                    Cognition Arousal/Alertness: Awake/alert Behavior During Therapy: Flat affect Overall Cognitive Status: Within Functional Limits for tasks assessed                      Exercises      General Comments General comments (skin integrity, edema, etc.): educated on mobility expectations and safety with mobility upon discharge      Pertinent Vitals/Pain Pain Assessment: 0-10 Pain Score: 3  Pain Location: headache Pain Descriptors / Indicators: Sore Pain Intervention(s): Monitored during session    Home Living                      Prior Function            PT Goals (current goals can now be found in the care plan section) Acute Rehab PT Goals Patient Stated Goal: none stated at this time. Did request to have feet down in recliner for comfort PT Goal Formulation: With patient Time For Goal Achievement: 11/01/15 Potential to Achieve Goals: Good Progress towards PT goals: Progressing toward goals    Frequency  Min 3X/week    PT Plan Current plan remains appropriate    Co-evaluation             End of Session  Equipment Utilized During Treatment: Gait belt Activity Tolerance: Patient tolerated treatment well;Patient limited by fatigue Patient left: in bed;with call bell/phone within reach;with family/visitor present     Time: 1521-1540 PT Time Calculation (min) (ACUTE ONLY): 19 min  Charges:  $Gait Training: 8-22 mins                    G CodesDuncan Dull 2015/10/24, 4:01 PM Alben Deeds, [place] DPT  [phone]

## 2015-10-19 NOTE — Progress Notes (Signed)
Patient ID: David [name], male   DOB: 14-Oct-1940, 75 y.o.   MRN: [id number] Spoke with Dr. Ronnald Ramp & left message at Dr. Ernestina Penna (oncology) office re: steroid dosing and immunotherapy injection. Per Dr. Ronnald Ramp, ok to taper Decadron to end so pt may proceed with Nivolumab on Thursday. Order entered for Decadron '4mg'$  BID tomorrow & '4mg'$  am Thursday.   Verdis Prime RN BSN

## 2015-10-20 ENCOUNTER — Ambulatory Visit: Payer: Medicare Other

## 2015-10-20 MED ORDER — POLYETHYLENE GLYCOL 3350 17 G PO PACK
17.0000 g | PACK | Freq: Every day | ORAL | Status: DC | PRN
  Administered 2015-10-20: 17 g via ORAL
  Filled 2015-10-20: qty 1

## 2015-10-20 NOTE — Progress Notes (Signed)
Pt had a moderate bowel movement after suppository given.  Will continue to monitor.  Cori Razor, RN

## 2015-10-20 NOTE — Progress Notes (Signed)
Subjective: Patient reports "I think I'm still a little constipated.Marland KitchenMarland KitchenI'm usually pretty regular"  Objective: Vital signs in last 24 hours: Temp:  [97.5 F (36.4 C)-98.8 F (37.1 C)] 98 F (36.7 C) (07/12 0524) Pulse Rate:  [72-80] 75 (07/12 0524) Resp:  [17-20] 20 (07/12 0524) BP: (120-137)/(81-103) 137/90 mmHg (07/12 0524) SpO2:  [94 %-100 %] 95 % (07/12 0524) Weight:  [90.357 kg (199 lb 3.2 oz)] 90.357 kg (199 lb 3.2 oz) (07/11 1015)  Intake/Output from previous day: 07/11 0701 - 07/12 0700 In: 360 [P.O.:360] Out: -  Intake/Output this shift:    Awake, alert, conversant. Notes only mild headache; primarily concerned with constipation. (Small BM's last few days) No distention or pain. Scalp incision well approximated with staples intact, no erythema, swellng, or drainage. PEARL. MAEW.  Lab Results: No results for input(s): WBC, HGB, HCT, PLT in the last 72 hours. BMET No results for input(s): NA, K, CL, CO2, GLUCOSE, BUN, CREATININE, CALCIUM in the last 72 hours.  Studies/Results: No results found.  Assessment/Plan: Improving   LOS: 5 days  Will continue to work on bowels and increasing mobility. He plans to request a suppository if no BM by noon; as miralax has not helped this morning. Pt is aware & agreeable to plan of decreasing Decadron to end, and postponing immunotherapy to next week.    Verdis Prime 10/20/2015, 7:37 AM

## 2015-10-20 NOTE — Care Management Note (Signed)
Case Management Note  Patient Details  Name: David [name] MRN: [id number] Date of Birth: 1940-12-11  Subjective/Objective:                    Action/Plan: Plan when patient is medically ready for discharge is for patient to return to [place] with [facility] services. CM spoke to [facility] and they do not have a specific [place] agency they use. CM called and spoke to patients daughter and asked her if there was a [facility] agency she would like to use and she stated [place]. Butch Penny with [facility] notified and accepted the referral. CM will continue to follow for further d/c needs.  Expected Discharge Date:                  Expected Discharge Plan:  [place]  In-House Referral:     Discharge planning Services  CM Consult  Post Acute Care Choice:  Home Health Choice offered to:  Adult Children  DME Arranged:    DME Agency:     HH Arranged:  PT, RN, Nurse's Aide [place] Agency:  [place]  Status of Service:  Completed, signed off  If discussed at [place] of Stay Meetings, dates discussed:    Additional Comments:  Pollie Friar, RN 10/20/2015, 12:34 PM

## 2015-10-20 NOTE — Progress Notes (Signed)
Physical Therapy Treatment Patient Details Name: David [name] MRN: [id number] DOB: 1941-02-23 Today's Date: 10/20/2015    History of Present Illness 75 yo male s/p L frontal craniotomy tumor excision with curve PMH: HTN OSA, CKD PPM L frontal hemorrhage with L to R shift , L lung mass, 3 skin melanoma, MI, metastatic malignant melanoma with 20 x 16 x 17 mm superficial left frontal lesion    PT Comments    Pt ambulation limited today by onset of nausea and dry heaving. Pt gagging, dry heaving and spitting up but no actual vomitting. Pt unable to tolerate food at this time. Pt primary concern "being stocked up." Acute PT to con't to follow.  Follow Up Recommendations  Supervision for mobility/OOB;Outpatient PT     Equipment Recommendations  None recommended by PT    Recommendations for Other Services       Precautions / Restrictions Precautions Precautions: Fall Restrictions Weight Bearing Restrictions: No    Mobility  Bed Mobility Overal bed mobility: Modified Independent Bed Mobility: Sit to Supine       Sit to supine: Modified independent (Device/Increase time)   General bed mobility comments: HOB elevated   Transfers Overall transfer level: Needs assistance Equipment used: None Transfers: Sit to/from Stand Sit to Stand: Supervision         General transfer comment: no use of UE use   Ambulation/Gait Ambulation/Gait assistance: Supervision Ambulation Distance (Feet): 75 Feet Assistive device: None Gait Pattern/deviations: WFL(Within Functional Limits)     General Gait Details: pt was supervision until pt became nauseated and began vomitting   Stairs            Wheelchair Mobility    Modified Rankin (Stroke Patients Only)       Balance Overall balance assessment: Needs assistance Sitting-balance support: No upper extremity supported;Feet supported Sitting balance-Leahy Scale: Good     Standing balance support: No upper extremity  supported;During functional activity Standing balance-Leahy Scale: Fair Standing balance comment: stood at sink to brush teeth with water s/p vommitting/dry heaving                    Cognition Arousal/Alertness: Awake/alert Behavior During Therapy: Impulsive Overall Cognitive Status: Impaired/Different from baseline Area of Impairment: Memory;Safety/judgement     Memory: Decreased short-term memory   Safety/Judgement: Decreased awareness of safety (impulsive)          Exercises      General Comments General comments (skin integrity, edema, etc.): educated      Pertinent Vitals/Pain Pain Assessment: 0-10 Pain Score: 7  Pain Location: head/incision Pain Descriptors / Indicators: Headache Pain Intervention(s): Monitored during session (pt given tylenol at 6am)    Home Living                      Prior Function            PT Goals (current goals can now be found in the care plan section) Acute Rehab PT Goals Patient Stated Goal: to go to the bathroom - pt inable to void bowels and very fixated on voiding Progress towards PT goals: Progressing toward goals    Frequency  Min 3X/week    PT Plan Current plan remains appropriate    Co-evaluation             End of Session Equipment Utilized During Treatment: Gait belt Activity Tolerance: Patient tolerated treatment well;Patient limited by fatigue Patient left: in bed;with call bell/phone within reach;with family/visitor present  Time: 0211-1552 PT Time Calculation (min) (ACUTE ONLY): 14 min  Charges:  $Gait Training: 8-22 mins                    G Codes:      David [name] 10/20/2015, 9:28 AM  David [name], PT, DPT Pager #: [phone] Office #: [phone]

## 2015-10-20 NOTE — Progress Notes (Signed)
Occupational Therapy Treatment Patient Details Name: David [name] MRN: [id number] DOB: 1940/12/19 Today's Date: 10/20/2015    History of present illness 75 yo male s/p L frontal craniotomy tumor excision with curve PMH: HTN OSA, CKD PPM L frontal hemorrhage with L to R shift , L lung mass, 3 skin melanoma, MI, metastatic malignant melanoma with 20 x 16 x 17 mm superficial left frontal lesion   OT comments  Pt with multiple trips to the bathroom this AM with staff. Declined first attempt at OT, reporting inability to void bowels and just needing to rest. Pt pushing call bell asking to do exercise. OT worked on ADLs sink level and dove tailed PT Ashly. Pt becoming nauseate and attempting to vomit in the hall so OT remained to assist with return to room. Pt provided warm prune juice.    Follow Up Recommendations  Home health OT;Supervision/Assistance - 24 hour    Equipment Recommendations  None recommended by OT    Recommendations for Other Services      Precautions / Restrictions Precautions Precautions: Fall       Mobility Bed Mobility Overal bed mobility: Modified Independent             General bed mobility comments: HOB elevated   Transfers Overall transfer level: Needs assistance   Transfers: Sit to/from Stand Sit to Stand: Supervision         General transfer comment: no use of UE use     Balance Overall balance assessment: Needs assistance Sitting-balance support: No upper extremity supported;Feet supported Sitting balance-Leahy Scale: Good     Standing balance support: No upper extremity supported;During functional activity Standing balance-Leahy Scale: Fair                     ADL Overall ADL's : Needs assistance/impaired     Grooming: Wash/dry face;Oral care;Wash/dry hands;Set up;Standing Grooming Details (indicate cue type and reason): sink level                  Toilet Transfer: Supervision/safety;Ambulation            Functional mobility during ADLs: Supervision/safety General ADL Comments: Pt answering cognitive quesitons while exiting the room with PT Ashly and becoming nauseated. pt requried total +2 (A) to return to room via chair.       Vision                     Perception     Praxis      Cognition   Behavior During Therapy: Flat affect Overall Cognitive Status: Within Functional Limits for tasks assessed                       Extremity/Trunk Assessment               Exercises     Shoulder Instructions       General Comments      Pertinent Vitals/ Pain       Pain Assessment: 0-10 Pain Score: 7  Pain Location: headache Pain Descriptors / Indicators: Headache Pain Intervention(s): Monitored during session;Repositioned;Patient requesting pain meds-RN notified (Rn checking on medication)  Home Living                                          Prior Functioning/Environment  Frequency Min 3X/week     Progress Toward Goals  OT Goals(current goals can now be found in the care plan section)  Progress towards OT goals: Progressing toward goals  Acute Rehab OT Goals Patient Stated Goal: to go to the bathroom - pt inable to void bowels and very fixated on voiding OT Goal Formulation: With patient Time For Goal Achievement: 11/01/15 Potential to Achieve Goals: Good ADL Goals Pt Will Perform Grooming: with supervision;standing Pt Will Perform Upper Body Dressing: with set-up;sitting Pt Will Perform Lower Body Dressing: with min guard assist;sit to/from stand Pt Will Transfer to Toilet: with min guard assist;regular height toilet;ambulating Pt Will Perform Toileting - Clothing Manipulation and hygiene: with supervision;sitting/lateral leans;sit to/from stand Additional ADL Goal #1: Pt will complete bed moblity mod I without rails as precursor to adls. Additional ADL Goal #2: Pt will complete DGI with score >19 to indicated  decr fall risk during adls.  Plan Discharge plan remains appropriate    Co-evaluation                 End of Session Equipment Utilized During Treatment: Gait belt;Rolling walker   Activity Tolerance Patient tolerated treatment well   Patient Left in bed;with call bell/phone within reach;with bed alarm set   Nurse Communication Mobility status;Precautions        Time: 9629-5284 OT Time Calculation (min): 10 min  Charges: OT General Charges $OT Visit: 1 Procedure OT Treatments $Self Care/Home Management : 8-22 mins  Parke Poisson B 10/20/2015, 9:05 AM   Jeri Modena   OTR/L Pager: [phone] Office: [phone] .

## 2015-10-20 NOTE — [place] FL2 (Signed)
[place] MEDICAID FL2 LEVEL OF CARE SCREENING TOOL     IDENTIFICATION  Patient Name: David [name] Birthdate: 1940/12/24 Sex: male Admission Date (Current Location): 10/15/2015  [facility] and [place] Number:  Engineering geologist and Address:  [facility] [place], [address] [place], [place] [address]      Provider Number: [id number]  Attending Physician Name and Address:  Erline Levine, MD  Relative Name and Phone Number:  Silas [place] - daughter - [phone]    Current Level of Care: Hospital Recommended Level of Care: [place] Prior Approval Number:    Date Approved/Denied:   PASRR Number: [id number]  Discharge Plan: SNF    Current Diagnoses: Patient Active Problem List   Diagnosis Date Noted  . Metastatic melanoma to lung ([place])   . Mood disorder ([place])   . Intracerebral hemorrhage ([place])   . BPH (benign prostatic hypertrophy)   . Metastasis to brain ([place]) 10/15/2015  . Metastatic melanoma ([place]) 09/22/2015  . Mass of lower lobe of left lung   . Chronic kidney disease, stage III (moderate)   . Hyperlipidemia 08/26/2015  . Obesity 08/26/2015  . Incontinence 08/26/2015  . Hemorrhagic stroke ([place]) 08/26/2015  . Hemiplegia and hemiparesis following nontraumatic intracerebral hemorrhage affecting right dominant side ([place]) 08/26/2015  . Aphasia following nontraumatic intracerebral hemorrhage 08/26/2015  . Gait disturbance, post-stroke 08/26/2015  . Benign essential HTN   . OSA (obstructive sleep apnea)   . CKD (chronic kidney disease)   . Pacemaker   . ICH (intracerebral hemorrhage) (HCC) - L frontal due to HTN vs CAA 08/22/2015    Orientation RESPIRATION BLADDER Height & Weight     Self, Time, Situation, Place  Normal Continent Weight: 90.357 kg (199 lb 3.2 oz) Height:  '5\' 9"'$  (175.3 cm)  BEHAVIORAL SYMPTOMS/MOOD NEUROLOGICAL BOWEL NUTRITION STATUS      Continent Diet (Please see DC Summary)  AMBULATORY STATUS  COMMUNICATION OF NEEDS Skin   Limited Assist Verbally Surgical wounds (Closed incision on head)                       Personal Care Assistance Level of Assistance  Bathing, Dressing, Feeding Bathing Assistance: Maximum assistance Feeding assistance: Limited assistance Dressing Assistance: Limited assistance     Functional Limitations Info             [place]  PT (By licensed PT), OT (By licensed OT)     PT Frequency: min 3x/week OT Frequency: min 3x/week            Contractures Contractures Info: Not present    Additional Factors Info  Code Status, Allergies, Psychotropic Code Status Info: Full Allergies Info: Zocor Psychotropic Info: Zoloft         Current Medications (10/20/2015):  This is the current hospital active medication list Current Facility-Administered Medications  Medication Dose Route Frequency Provider Last Rate Last Dose  . 0.9 % NaCl with KCl 20 mEq/ L  infusion   Intravenous Continuous Erline Levine, MD 75 mL/hr at 10/18/15 2100    . acetaminophen (TYLENOL) tablet 650 mg  650 mg Oral Q4H PRN Erline Levine, MD       Or  . acetaminophen (TYLENOL) suppository 650 mg  650 mg Rectal Q4H PRN Erline Levine, MD      . acetaminophen (TYLENOL) tablet 650 mg  650 mg Oral QID Erline Levine, MD   650 mg at 10/20/15 1019  . bisacodyl (DULCOLAX) suppository  10 mg  10 mg Rectal Daily PRN Erline Levine, MD   10 mg at 10/19/15 0805  . carvedilol (COREG) tablet 3.125 mg  3.125 mg Oral BID WC Erline Levine, MD   3.125 mg at 10/20/15 0800  . dexamethasone (DECADRON) injection 4 mg  4 mg Intravenous Q12H Eustace Moore, MD   4 mg at 10/20/15 1020  . docusate sodium (COLACE) capsule 100 mg  100 mg Oral BID Erline Levine, MD   100 mg at 10/20/15 1019  . famotidine (PEPCID) tablet 20 mg  20 mg Oral Q12H Kimberly B Hammons, RPH   20 mg at 10/20/15 1020  . HYDROmorphone (DILAUDID) injection 0.5 mg  0.5 mg Intravenous Q1H PRN Ashok Pall, MD      .  levETIRAcetam (KEPPRA) tablet 500 mg  500 mg Oral BID Valeda Malm Rumbarger, RPH   500 mg at 10/20/15 1020  . levothyroxine (SYNTHROID, LEVOTHROID) tablet 125 mcg  125 mcg Oral QAC breakfast Erline Levine, MD   125 mcg at 10/20/15 0800  . lisinopril (PRINIVIL,ZESTRIL) tablet 5 mg  5 mg Oral BID Erline Levine, MD   5 mg at 10/20/15 1019  . multivitamin with minerals tablet 1 tablet  1 tablet Oral Daily Erline Levine, MD   1 tablet at 10/20/15 1020  . ondansetron (ZOFRAN) tablet 4 mg  4 mg Oral Q4H PRN Erline Levine, MD       Or  . ondansetron [facility]) injection 4 mg  4 mg Intravenous Q4H PRN Erline Levine, MD   4 mg at 10/17/15 1830  . oxyCODONE-acetaminophen (PERCOCET/ROXICET) 5-325 MG per tablet 1-2 tablet  1-2 tablet Oral Q4H PRN Ashok Pall, MD   1 tablet at 10/16/15 2328  . polyethylene glycol (MIRALAX / GLYCOLAX) packet 17 g  17 g Oral Daily Erline Levine, MD   17 g at 10/20/15 1019  . polyethylene glycol (MIRALAX / GLYCOLAX) packet 17 g  17 g Oral Daily PRN Erline Levine, MD   17 g at 10/20/15 0536  . promethazine (PHENERGAN) injection 12.5 mg  12.5 mg Intravenous Q6H PRN Eustace Moore, MD   12.5 mg at 10/18/15 7681  . promethazine (PHENERGAN) tablet 12.5-25 mg  12.5-25 mg Oral Q4H PRN Erline Levine, MD      . rosuvastatin (CRESTOR) tablet 10 mg  10 mg Oral QHS Erline Levine, MD   10 mg at 10/19/15 2132  . senna-docusate (Senokot-S) tablet 1 tablet  1 tablet Oral QHS PRN Erline Levine, MD   1 tablet at 10/17/15 2137  . sertraline (ZOLOFT) tablet 125 mg  125 mg Oral Daily Erline Levine, MD   125 mg at 10/20/15 1019     Discharge Medications: Please see discharge summary for a list of discharge medications.  Relevant Imaging Results:  Relevant Lab Results:   Additional Information SSN: [place]  [place], [place]

## 2015-10-20 NOTE — Progress Notes (Signed)
RT placed patient CPAP HS. 2L O2 bleed in needed. Patient tolerating well.

## 2015-10-21 ENCOUNTER — Other Ambulatory Visit: Payer: Medicare Other

## 2015-10-21 ENCOUNTER — Ambulatory Visit: Payer: Medicare Other

## 2015-10-21 ENCOUNTER — Ambulatory Visit: Payer: Medicare Other | Admitting: Hematology

## 2015-10-21 NOTE — Care Management Note (Signed)
Case Management Note  Patient Details  Name: David [name] MRN: [id number] Date of Birth: [birth year]  Subjective/Objective:                    Action/Plan: Pt discharging today to [place] with [facility] services through [facility]. Butch Penny with [facility] informed of the discharge. Daughter to transport patient to [place]. Bedside RN updated.   Expected Discharge Date:                  Expected Discharge Plan:  [place]  In-House Referral:     Discharge planning Services  CM Consult  Post Acute Care Choice:  Home Health Choice offered to:  Adult Children  DME Arranged:    DME Agency:     HH Arranged:  PT, RN, Nurse's Aide [place] Agency:  [place]  Status of Service:  Completed, signed off  If discussed at [place] of Stay Meetings, dates discussed:    Additional Comments:  Pollie Friar, RN 10/21/2015, 10:36 AM

## 2015-10-21 NOTE — Progress Notes (Signed)
Subjective: Patient reports "I feel much better (since BM yesterday afternoon)"  Objective: Vital signs in last 24 hours: Temp:  [97.5 F (36.4 C)-98.5 F (36.9 C)] 97.5 F (36.4 C) (07/13 0502) Pulse Rate:  [75-92] 92 (07/13 0502) Resp:  [18-20] 18 (07/13 0502) BP: (123-147)/(55-107) 147/95 mmHg (07/13 0502) SpO2:  [94 %-98 %] 97 % (07/13 0502)  Intake/Output from previous day:   Intake/Output this shift: Total I/O In: 240 [P.O.:240] Out: -   Alert, conversant. PEARL. MAEW. No nausea. No headache. Incsion remains well approximated with staples intact. No erythema or drainage. He has been ambulating to bathroom.   Lab Results: No results for input(s): WBC, HGB, HCT, PLT in the last 72 hours. BMET No results for input(s): NA, K, CL, CO2, GLUCOSE, BUN, CREATININE, CALCIUM in the last 72 hours.  Studies/Results: No results found.  Assessment/Plan: Improving   LOS: 6 days  Per Dr. Ronnald Ramp, ok to d/c to home/SNF today. Office f/u next week with Dr. Vertell Limber for staple removal.  Last dose Decadron this morning. To see oncology next week for immunotherapy.    Verdis Prime 10/21/2015, 9:50 AM

## 2015-10-21 NOTE — Discharge Summary (Signed)
Physician Discharge Summary  Patient ID: David [name] MRN: [id number] DOB/AGE: May 29, 1940 75 y.o.  Admit date: 10/15/2015 Discharge date: 10/21/2015  Admission Diagnoses: Melanoma metastasis to brain (Left frontal region)   Discharge Diagnoses: Melanoma metastasis to brain (Left frontal region) s/p LEFT FRONTAL CRANIOTOMY TUMOR EXCISION with Curve (N/A) - CRANIOTOMY TUMOR EXCISION APPLICATION OF CRANIAL NAVIGATION (N/A) with microdissection  Active Problems:   Metastasis to brain [facility])   Discharged Condition: good  Hospital Course: David [name] was admitted for surgery with dx melanoma metastasis.  Following uncomplicated craniotomy for resection, he recovered well and transferred to Neuro ICU, and 5C.  Nausea and vomiting slowed recovery, but responded well to steroids. Steroids have weaned and he has progressed steadily.   Consults: None  Significant Diagnostic Studies: radiology: CT scan: post-op  Treatments: surgery: LEFT FRONTAL CRANIOTOMY TUMOR EXCISION with Curve (N/A) - CRANIOTOMY TUMOR EXCISION APPLICATION OF CRANIAL NAVIGATION (N/A) with microdissection   Discharge Exam: Blood pressure 147/95, pulse 92, temperature 97.5 F (36.4 C), temperature source Oral, resp. rate 18, height '5\' 9"'$  (1.753 m), weight 90.357 kg (199 lb 3.2 oz), SpO2 97 %. Alert, conversant. PEARL. MAEW. No nausea. No headache. Incsion remains well approximated with staples intact. No erythema or drainage. He has been ambulating to bathroom.    Disposition: 03-Skilled Nursing Facility  Office f/u next week with Dr. Vertell Limber for staple removal.  Last dose Decadron this morning. To see oncology next week for immunotherapy.       Medication List    ASK your doctor about these medications        acetaminophen 325 MG tablet  Commonly known as:  TYLENOL  Take 2 tablets (650 mg total) by mouth every 6 (six) hours as needed for mild pain.     carvedilol 3.125 MG tablet  Commonly known as:  COREG   Take 3.125 mg by mouth 2 (two) times daily with a meal.     dexamethasone 4 MG tablet  Commonly known as:  DECADRON  Take 1 tablet (4 mg total) by mouth 2 (two) times daily.     levothyroxine 125 MCG tablet  Commonly known as:  SYNTHROID, LEVOTHROID  Take 125 mcg by mouth daily before breakfast.     lisinopril 5 MG tablet  Commonly known as:  PRINIVIL,ZESTRIL  Take 5 mg by mouth 2 (two) times daily.     multivitamin with minerals Tabs tablet  Take 1 tablet by mouth daily.     PRESCRIPTION MEDICATION  Inhale into the lungs at bedtime. CPAP     rosuvastatin 10 MG tablet  Commonly known as:  CRESTOR  Take 10 mg by mouth at bedtime.     selenium sulfide 1 % Lotn  Commonly known as:  SELSUN  Apply 1 application topically daily.     senna-docusate 8.6-50 MG tablet  Commonly known as:  Senokot-S  Take 1 tablet by mouth 2 (two) times daily.     sertraline 50 MG tablet  Commonly known as:  ZOLOFT  Take 2.5 tablets (125 mg total) by mouth daily.     Turmeric Curcumin 500 MG Caps  Take 500 mg by mouth 2 (two) times daily.         Signed: Verdis Prime 10/21/2015, 10:01 AM

## 2015-10-21 NOTE — Care Management Important Message (Signed)
Important Message  Patient Details  Name: David [name] MRN: [id number] Date of Birth: January 08, 1941   Medicare Important Message Given:  Yes    Loann Quill 10/21/2015, 8:20 AM

## 2015-10-22 ENCOUNTER — Telehealth: Payer: Self-pay

## 2015-10-22 NOTE — Telephone Encounter (Signed)
IF patients daughter call back please cancel appt for November 04, 2015 and r/s her father. This is per Dr. Erlinda Hong request.Thanks Left vm for daughter to call back.

## 2015-10-25 ENCOUNTER — Telehealth: Payer: Self-pay | Admitting: Radiation Therapy

## 2015-10-25 ENCOUNTER — Telehealth: Payer: Self-pay

## 2015-10-25 NOTE — Telephone Encounter (Signed)
Loaded for visit tomorrow

## 2015-10-25 NOTE — Telephone Encounter (Addendum)
I called to speak with [name]' daughter David [name] to see how well he has been recovering from his SRS and surgery. David said that his first couple days following surgery were great, but shortly afterwards he was not doing as well as expected. There were issues with the nurses not getting him up and walking him around, constipation, nurse staff not bathing him, and overall needs not being met as they should.     David said that from a surgical stand point and ICU, things were great, it was afterwards when he experienced the decrease in care.   Since discharge things are better and he has the home health that is needed. I asked David to please call if he needs us for anything. He is scheduled to see Dr. Feng on 7/19. David was happy for the call and knows that we are here for her and her father if needed.     R.T.(R)(T) Special Procedures Navigator    

## 2015-10-26 ENCOUNTER — Ambulatory Visit: Payer: Medicare Other | Admitting: Internal Medicine

## 2015-10-26 NOTE — Telephone Encounter (Signed)
LFT vm for patients daughter to call back to r/s father appt on 11/04/2015 per her request. Pt needs to r/s in 2 months

## 2015-10-27 ENCOUNTER — Encounter: Payer: Self-pay | Admitting: Hematology

## 2015-10-27 ENCOUNTER — Ambulatory Visit (HOSPITAL_BASED_OUTPATIENT_CLINIC_OR_DEPARTMENT_OTHER): Payer: Medicare Other | Admitting: Hematology

## 2015-10-27 ENCOUNTER — Other Ambulatory Visit (HOSPITAL_BASED_OUTPATIENT_CLINIC_OR_DEPARTMENT_OTHER): Payer: Medicare Other

## 2015-10-27 ENCOUNTER — Ambulatory Visit (HOSPITAL_BASED_OUTPATIENT_CLINIC_OR_DEPARTMENT_OTHER): Payer: Medicare Other

## 2015-10-27 ENCOUNTER — Telehealth: Payer: Self-pay | Admitting: Hematology

## 2015-10-27 VITALS — BP 111/85 | HR 82 | Temp 97.5°F | Resp 17 | Ht 69.0 in | Wt 188.8 lb

## 2015-10-27 VITALS — BP 120/86 | HR 86 | Resp 20

## 2015-10-27 DIAGNOSIS — C799 Secondary malignant neoplasm of unspecified site: Secondary | ICD-10-CM

## 2015-10-27 DIAGNOSIS — I629 Nontraumatic intracranial hemorrhage, unspecified: Secondary | ICD-10-CM | POA: Diagnosis not present

## 2015-10-27 DIAGNOSIS — C7951 Secondary malignant neoplasm of bone: Secondary | ICD-10-CM

## 2015-10-27 DIAGNOSIS — C770 Secondary and unspecified malignant neoplasm of lymph nodes of head, face and neck: Secondary | ICD-10-CM | POA: Diagnosis not present

## 2015-10-27 DIAGNOSIS — C7931 Secondary malignant neoplasm of brain: Secondary | ICD-10-CM

## 2015-10-27 DIAGNOSIS — C439 Malignant melanoma of skin, unspecified: Secondary | ICD-10-CM

## 2015-10-27 DIAGNOSIS — Z79899 Other long term (current) drug therapy: Secondary | ICD-10-CM | POA: Diagnosis not present

## 2015-10-27 DIAGNOSIS — C7800 Secondary malignant neoplasm of unspecified lung: Secondary | ICD-10-CM | POA: Diagnosis not present

## 2015-10-27 DIAGNOSIS — I619 Nontraumatic intracerebral hemorrhage, unspecified: Secondary | ICD-10-CM

## 2015-10-27 DIAGNOSIS — Z5112 Encounter for antineoplastic immunotherapy: Secondary | ICD-10-CM

## 2015-10-27 LAB — COMPREHENSIVE METABOLIC PANEL
ALBUMIN: 3.3 g/dL — AB (ref 3.5–5.0)
ALK PHOS: 71 U/L (ref 40–150)
ALT: 20 U/L (ref 0–55)
AST: 17 U/L (ref 5–34)
Anion Gap: 11 mEq/L (ref 3–11)
BILIRUBIN TOTAL: 0.56 mg/dL (ref 0.20–1.20)
BUN: 61.4 mg/dL — ABNORMAL HIGH (ref 7.0–26.0)
CO2: 17 mEq/L — ABNORMAL LOW (ref 22–29)
Calcium: 8.9 mg/dL (ref 8.4–10.4)
Chloride: 105 mEq/L (ref 98–109)
Creatinine: 1.2 mg/dL (ref 0.7–1.3)
EGFR: 58 mL/min/{1.73_m2} — AB (ref >90)
GLUCOSE: 118 mg/dL (ref 70–140)
Potassium: 5.2 mEq/L — ABNORMAL HIGH (ref 3.5–5.1)
SODIUM: 133 meq/L — AB (ref 136–145)
TOTAL PROTEIN: 6.8 g/dL (ref 6.4–8.3)

## 2015-10-27 LAB — CBC WITH DIFFERENTIAL/PLATELET
BASO%: 0.4 % (ref 0.0–2.0)
BASOS ABS: 0.1 10*3/uL (ref 0.0–0.1)
EOS%: 1 % (ref 0.0–7.0)
Eosinophils Absolute: 0.1 10*3/uL (ref 0.0–0.5)
HCT: 40.5 % (ref 38.4–49.9)
HEMOGLOBIN: 13.7 g/dL (ref 13.0–17.1)
LYMPH#: 0.8 10*3/uL — AB (ref 0.9–3.3)
LYMPH%: 6.7 % — ABNORMAL LOW (ref 14.0–49.0)
MCH: 30.2 pg (ref 27.2–33.4)
MCHC: 33.8 g/dL (ref 32.0–36.0)
MCV: 89.5 fL (ref 79.3–98.0)
MONO#: 0.8 10*3/uL (ref 0.1–0.9)
MONO%: 6.7 % (ref 0.0–14.0)
NEUT%: 85.2 % — ABNORMAL HIGH (ref 39.0–75.0)
NEUTROS ABS: 10.1 10*3/uL — AB (ref 1.5–6.5)
PLATELETS: 289 10*3/uL (ref 140–400)
RBC: 4.53 10*6/uL (ref 4.20–5.82)
RDW: 16.3 % — AB (ref 11.0–14.6)
WBC: 11.8 10*3/uL — AB (ref 4.0–10.3)

## 2015-10-27 LAB — TSH: TSH: 5.165 m[IU]/L — AB (ref 0.320–4.118)

## 2015-10-27 MED ORDER — SODIUM CHLORIDE 0.9 % IV SOLN
INTRAVENOUS | Status: AC
  Administered 2015-10-27: 10:00:00 via INTRAVENOUS

## 2015-10-27 MED ORDER — SODIUM CHLORIDE 0.9 % IV SOLN: Freq: Once | INTRAVENOUS | Status: DC

## 2015-10-27 MED ORDER — SODIUM CHLORIDE 0.9 % IV SOLN
240.0000 mg | Freq: Once | INTRAVENOUS | Status: AC
  Administered 2015-10-27: 240 mg via INTRAVENOUS
  Filled 2015-10-27: qty 8

## 2015-10-27 NOTE — Progress Notes (Signed)
Pt cmet labs abnormal. Notified Dr. Burr Medico and ordered for pt to receive 1L of IVF's over 2hrs. Pt also reports feeling some mild dizziness this morning. VSS. Will monitor pt closely.

## 2015-10-27 NOTE — Patient Instructions (Addendum)
[place] Discharge Instructions for Patients Receiving Chemotherapy  Today you received the following chemotherapy agents :  Nivolumab.  To help prevent nausea and vomiting after your treatment, we encourage you to take your nausea medication as prescribed.   If you develop nausea and vomiting that is not controlled by your nausea medication, call the clinic.   BELOW ARE SYMPTOMS THAT SHOULD BE REPORTED IMMEDIATELY:  *FEVER GREATER THAN 100.5 F  *CHILLS WITH OR WITHOUT FEVER  NAUSEA AND VOMITING THAT IS NOT CONTROLLED WITH YOUR NAUSEA MEDICATION  *UNUSUAL SHORTNESS OF BREATH  *UNUSUAL BRUISING OR BLEEDING  TENDERNESS IN MOUTH AND THROAT WITH OR WITHOUT PRESENCE OF ULCERS  *URINARY PROBLEMS  *BOWEL PROBLEMS  UNUSUAL RASH Items with * indicate a potential emergency and should be followed up as soon as possible.  Feel free to call the clinic you have any questions or concerns. The clinic phone number is [phone].  Please show the [place] at check-in to the Emergency Department and triage nurse.  Nivolumab injection What is this medicine? NIVOLUMAB (nye VOL ue mab) is a monoclonal antibody. It is used to treat melanoma, lung cancer, kidney cancer, and Hodgkin lymphoma. This medicine may be used for other purposes; ask your health care provider or pharmacist if you have questions. What should I tell my health care provider before I take this medicine? They need to know if you have any of these conditions: -diabetes -immune system problems -kidney disease -liver disease -lung disease -organ transplant -stomach or intestine problems -thyroid disease -an unusual or allergic reaction to nivolumab, other medicines, foods, dyes, or preservatives -pregnant or trying to get pregnant -breast-feeding How should I use this medicine? This medicine is for infusion into a vein. It is given by a health care professional in a hospital or clinic  setting. A special MedGuide will be given to you before each treatment. Be sure to read this information carefully each time. Talk to your pediatrician regarding the use of this medicine in children. Special care may be needed. Overdosage: If you think you have taken too much of this medicine contact a poison control center or emergency room at once. NOTE: This medicine is only for you. Do not share this medicine with others. What if I miss a dose? It is important not to miss your dose. Call your doctor or health care professional if you are unable to keep an appointment. What may interact with this medicine? Interactions have not been studied. Give your health care provider a list of all the medicines, herbs, non-prescription drugs, or dietary supplements you use. Also tell them if you smoke, drink alcohol, or use illegal drugs. Some items may interact with your medicine. This list may not describe all possible interactions. Give your health care provider a list of all the medicines, herbs, non-prescription drugs, or dietary supplements you use. Also tell them if you smoke, drink alcohol, or use illegal drugs. Some items may interact with your medicine. What should I watch for while using this medicine? This drug may make you feel generally unwell. Continue your course of treatment even though you feel ill unless your doctor tells you to stop. You may need blood work done while you are taking this medicine. Do not become pregnant while taking this medicine or for 5 months after stopping it. Women should inform their doctor if they wish to become pregnant or think they might be pregnant. There is a potential for serious side effects to  an unborn child. Talk to your health care professional or pharmacist for more information. Do not breast-feed an infant while taking this medicine. What side effects may I notice from receiving this medicine? Side effects that you should report to your doctor or health  care professional as soon as possible: -allergic reactions like skin rash, itching or hives, swelling of the face, lips, or tongue -black, tarry stools -blood in the urine -bloody or watery diarrhea -changes in vision -change in sex drive -changes in emotions or moods -chest pain -confusion -cough -decreased appetite -diarrhea -facial flushing -feeling faint or lightheaded -fever, chills -hair loss -hallucination, loss of contact with reality -headache -irritable -joint pain -loss of memory -muscle pain -muscle weakness -seizures -shortness of breath -signs and symptoms of high blood sugar such as dizziness; dry mouth; dry skin; fruity breath; nausea; stomach pain; increased hunger or thirst; increased urination -signs and symptoms of kidney injury like trouble passing urine or change in the amount of urine -signs and symptoms of liver injury like dark yellow or brown urine; general ill feeling or flu-like symptoms; light-colored stools; loss of appetite; nausea; right upper belly pain; unusually weak or tired; yellowing of the eyes or skin -stiff neck -swelling of the ankles, feet, hands -weight gain Side effects that usually do not require medical attention (report to your doctor or health care professional if they continue or are bothersome): -bone pain -constipation -tiredness -vomiting This list may not describe all possible side effects. Call your doctor for medical advice about side effects. You may report side effects to FDA at 1-800-FDA-1088. Where should I keep my medicine? This drug is given in a hospital or clinic and will not be stored at home. NOTE: This sheet is a summary. It may not cover all possible information. If you have questions about this medicine, talk to your doctor, pharmacist, or health care provider.    2016, Elsevier/Gold Standard. (2014-08-26 10:03:42)  Dehydration, Adult Dehydration is a condition in which you do not have enough fluid or  water in your body. It happens when you take in less fluid than you lose. Vital organs such as the kidneys, brain, and heart cannot function without a proper amount of fluids. Any loss of fluids from the body can cause dehydration.  Dehydration can range from mild to severe. This condition should be treated right away to help prevent it from becoming severe. CAUSES  This condition may be caused by:  Vomiting.  Diarrhea.  Excessive sweating, such as when exercising in hot or humid weather.  Not drinking enough fluid during strenuous exercise or during an illness.  Excessive urine output.  Fever.  Certain medicines. RISK FACTORS This condition is more likely to develop in:  People who are taking certain medicines that cause the body to lose excess fluid (diuretics).   People who have a chronic illness, such as diabetes, that may increase urination.  Older adults.   People who live at high altitudes.   People who participate in endurance sports.  SYMPTOMS  Mild Dehydration  Thirst.  Dry lips.  Slightly dry mouth.  Dry, warm skin. Moderate Dehydration  Very dry mouth.   Muscle cramps.   Dark urine and decreased urine production.   Decreased tear production.   Headache.   Light-headedness, especially when you stand up from a sitting position.  Severe Dehydration  Changes in skin.   Cold and clammy skin.   Skin does not spring back quickly when lightly pinched and released.  Changes in body fluids.   Extreme thirst.   No tears.   Not able to sweat when body temperature is high, such as in hot weather.   Minimal urine production.   Changes in vital signs.   Rapid, weak pulse (more than 100 beats per minute when you are sitting still).   Rapid breathing.   Low blood pressure.   Other changes.   Sunken eyes.   Cold hands and feet.   Confusion.  Lethargy and difficulty being awakened.  Fainting (syncope).    Short-term weight loss.   Unconsciousness. DIAGNOSIS  This condition may be diagnosed based on your symptoms. You may also have tests to determine how severe your dehydration is. These tests may include:   Urine tests.   Blood tests.  TREATMENT  Treatment for this condition depends on the severity. Mild or moderate dehydration can often be treated at home. Treatment should be started right away. Do not wait until dehydration becomes severe. Severe dehydration needs to be treated at the hospital. Treatment for Mild Dehydration  Drinking plenty of water to replace the fluid you have lost.   Replacing minerals in your blood (electrolytes) that you may have lost.  Treatment for Moderate Dehydration  Consuming oral rehydration solution (ORS). Treatment for Severe Dehydration  Receiving fluid through an IV tube.   Receiving electrolyte solution through a feeding tube that is passed through your nose and into your stomach (nasogastric tube or NG tube).  Correcting any abnormalities in electrolytes. HOME CARE INSTRUCTIONS   Drink enough fluid to keep your urine clear or pale yellow.   Drink water or fluid slowly by taking small sips. You can also try sucking on ice cubes.  Have food or beverages that contain electrolytes. Examples include bananas and sports drinks.  Take over-the-counter and prescription medicines only as told by your health care provider.   Prepare ORS according to the manufacturer's instructions. Take sips of ORS every 5 minutes until your urine returns to normal.  If you have vomiting or diarrhea, continue to try to drink water, ORS, or both.   If you have diarrhea, avoid:   Beverages that contain caffeine.   Fruit juice.   Milk.   Carbonated soft drinks.  Do not take salt tablets. This can lead to the condition of having too much sodium in your body (hypernatremia).  SEEK MEDICAL CARE IF:  You cannot eat or drink without  vomiting.  You have had moderate diarrhea during a period of more than 24 hours.  You have a fever. SEEK IMMEDIATE MEDICAL CARE IF:   You have extreme thirst.  You have severe diarrhea.  You have not urinated in 6-8 hours, or you have urinated only a small amount of very dark urine.  You have shriveled skin.  You are dizzy, confused, or both.   This information is not intended to replace advice given to you by your health care provider. Make sure you discuss any questions you have with your health care provider.   Document Released: 03/27/2005 Document Revised: 12/16/2014 Document Reviewed: 08/12/2014 Elsevier Interactive Patient Education [employer].

## 2015-10-27 NOTE — Progress Notes (Signed)
[place]  [phone] Fax:(336) [phone]  Clinic Follow up Note   Patient Care Team: [name], MD as PCP - General (Internal Medicine) 10/27/2015  Chief complaint: Follow-up metastatic melanoma  Oncology History   Metastatic melanoma [facility])   Staging form: Melanoma of the Skin, AJCC 7th Edition     Clinical stage from 09/21/2015: Stage IV [place], [name], [place]) - Signed by [name], MD on 10/09/2015       Metastatic melanoma ([place])   09/16/2015 Imaging PET scan showed a hypermetabolic 8.6PY lingular nodule, and a 79m right upper lobe lung nodule, left subclavicular nodes, and bone metastasis in the proximal right humerus and right femur.    09/21/2015 Initial Diagnosis Metastatic melanoma ([place]   09/21/2015 Initial Biopsy Left subclavicular lymph node biopsy showed prostatic of melanoma   09/21/2015 Miscellaneous BRAF V600K mutation (+)    10/04/2015 Imaging CT head with without contrast showed a 2.0 x 1.6 x 1.7 cm superficial left frontal hyperdense lesion, with postcontrast enhancement, lying within the previous hemorrhagic area, concerning for solitary melanoma metastasis   10/06/2015 -  Chemotherapy Nivolumab 2451mevery 2 weeks    10/13/2015 - 10/13/2015 Radiation Therapy SRS to his left frontal solitary brain metastasis    10/15/2015 Surgery left front craniotomy and tumor excision    10/15/2015 Pathology Results left frontal brain mass excision showed metastatic melanoma, 1.5X1.5X0.6cm    History of present illness (09/02/2015): Pt is a [age].o. Retired dePharmacist, communityith history of HTN, OSA, CKD, PPM who was admitted on 08/22/15 with expressive difficulty. He was found to have left frontal hemorrhage 5.7 cm x 4.5 cm with 6 mm left to right shift.During the workup for his stroke, a left upper lung mass was noticed on the CT scan. I was called to evaluate his lung mass.  Patient has aphasia, not able to communicate much. According to his daughter, Dr. LeGala Romneywho is a OB  [facility]N at [facility] probably only smoked for very short period time during the college. He did have 3 skin melanoma, which was removed, last one was 2-3 years ago. No other cancer history.  CURRENT THERAPY: Nivolumab 240 mg, every 2 weeks, started on 10/06/2015  INTERVAL HISTORY: Mr. HaMatheyeturns for follow-up. He is accompanied by his wife to my clinic today. He received SBRT to his solitary brain metastasis on July 5, and underwent left frontal craniotomy to be and brain mass excision. His postoperative was completed with constipation, and he was finally discharged back to his assisted living on July 13. He feels much better this week, with more energy, able to walk independently with a walker or cane, mild tolerable pain at the incision site, no other neurological symptoms. He has been on dexamethasone 4 mg twice daily since his hospital discharge. His appetite is normal, bowel movement has returned to normal also.   REVIEW OF SYSTEMS:  He has is in a sling 5 yes Constitutional: Denies fevers, chills or abnormal weight loss Eyes: Denies blurriness of vision Ears, nose, mouth, throat, and face: Denies mucositis or sore throat Respiratory: Denies cough, dyspnea or wheezes Cardiovascular: Denies palpitation, chest discomfort or lower extremity swelling Gastrointestinal:  Denies nausea, heartburn or change in bowel habits Skin: Denies abnormal skin rashes Lymphatics: Denies new lymphadenopathy or easy bruising Neurological:see HPI  Behavioral/Psych: Mood is stable, no new changes  All other systems were reviewed with the patient and are negative.  MEDICAL HISTORY:  Past Medical History  Diagnosis Date  .  Hypertension   . Sleep apnea     uses CPAP  . High cholesterol   . Renal disorder   . Rectal bleed     due to NSAIDS  . Melanoma ([place])   . Myocardial infarction ([place])   . Skin cancer     melanoma  . Brain cancer ([place])     melanoma with brain met  . Coronary artery disease    . Anginal pain ([place])   . Mood disorder ([place])   . Stroke ([place])   . Hypothyroidism   . GERD (gastroesophageal reflux disease)   . Arthritis     mostly hands  . Pacemaker     due to syncope, 3rd degree HB (upgrade to [facility]. Jude CRT-P 02/25/13 (Dr. Uvaldo Rising)  . Metastatic melanoma to lung ([place])   . Intracerebral hemorrhage ([place])   . BPH (benign prostatic hypertrophy)   . Benign familial hematuria     SURGICAL HISTORY: Past Surgical History  Procedure Laterality Date  . Cardiac surgery      bypass X 2  . Knee arthroscopy  12/2008  . Foot surgery  08/2010  . Lymph node biopsy    . Pacemaker insertion    . Cardiac catheterization  2013  . Joint replacement Left     partial knee  . Tonsillectomy    . Eye surgery    . Coronary artery bypass graft  2013    LIMA-LAD, SVG-PDA 03/27/11 (Dr. Francee Gentile)  . Craniotomy N/A 10/15/2015    Procedure: LEFT FRONTAL CRANIOTOMY TUMOR EXCISION with Curve;  Surgeon: Erline Levine, MD;  Location: [place] [facility]S;  Service: Neurosurgery;  Laterality: N/A;  CRANIOTOMY TUMOR EXCISION  . Application of cranial navigation N/A 10/15/2015    Procedure: APPLICATION OF CRANIAL NAVIGATION;  Surgeon: Erline Levine, MD;  Location: [place] NEURO ORS;  Service: Neurosurgery;  Laterality: N/A;    I have reviewed the social history and family history with the patient and they are unchanged from previous note.  ALLERGIES:  is allergic to zocor.  MEDICATIONS:  Current Outpatient Prescriptions  Medication Sig Dispense Refill  . acetaminophen (TYLENOL) 325 MG tablet Take 2 tablets (650 mg total) by mouth every 6 (six) hours as needed for mild pain. (Patient taking differently: Take 650 mg by mouth 3 (three) times daily. )    . alfuzosin (UROXATRAL) 10 MG 24 hr tablet Take 1 tablet by mouth daily.  0  . carvedilol (COREG) 3.125 MG tablet Take 3.125 mg by mouth 2 (two) times daily with a meal.    . dexamethasone (DECADRON) 4 MG tablet Take 1 tablet (4 mg total) by mouth 2 (two) times  daily. 60 tablet 0  . levothyroxine (SYNTHROID, LEVOTHROID) 125 MCG tablet Take 125 mcg by mouth daily before breakfast.    . Multiple Vitamin (MULTIVITAMIN WITH MINERALS) TABS tablet Take 1 tablet by mouth daily.    Marland Kitchen PRESCRIPTION MEDICATION Inhale into the lungs at bedtime. CPAP    . rosuvastatin (CRESTOR) 10 MG tablet Take 10 mg by mouth at bedtime.    . senna-docusate (SENOKOT-S) 8.6-50 MG tablet Take 1 tablet by mouth 2 (two) times daily.    . sertraline (ZOLOFT) 50 MG tablet Take 2.5 tablets (125 mg total) by mouth daily.    . Turmeric Curcumin 500 MG CAPS Take 500 mg by mouth 2 (two) times daily.     No current facility-administered medications for this visit.   Facility-Administered Medications Ordered in Other Visits  Medication Dose Route Frequency Provider  Last Rate Last Dose  . 0.9 %  sodium chloride infusion   Intravenous Continuous [name], MD 500 mL/hr at 10/27/15 1020    . 0.9 %  sodium chloride infusion   Intravenous Once [name], MD      . nivolumab (OPDIVO) 240 mg in sodium chloride 0.9 % 100 mL chemo infusion  240 mg Intravenous Once [name], MD        PHYSICAL EXAMINATION: ECOG PERFORMANCE STATUS: 3  Filed Vitals:   10/27/15 0924 10/27/15 0928  BP: 111/85   Pulse: 82   Temp:  97.5 F (36.4 C)  Resp: 17    Filed Weights   10/27/15 0924  Weight: 188 lb 12.8 oz (85.639 kg)    GENERAL:alert, no distress and comfortable SKIN: skin color, texture, turgor are normal, no rashes or significant lesions EYES: normal, Conjunctiva are pink and non-injected, sclera clear OROPHARYNX:no exudate, no erythema and lips, buccal mucosa, and tongue normal  NECK: supple, thyroid normal size, non-tender, without nodularity LYMPH:  no palpable lymphadenopathy in the cervical, [place], axillary or inguinal LUNGS: clear to auscultation and percussion with normal breathing effort HEART: regular rate & rhythm and no murmurs and no lower extremity edema ABDOMEN:abdomen soft, non-tender  and normal bowel sounds Musculoskeletal:no cyanosis of digits and no clubbing  NEURO: alert & oriented x 3 with fluent speech, no focal motor/sensory deficits, he walks with a cane independently, although his gait is slightly wobbly.   LABORATORY DATA:  I have reviewed the data as listed CBC Latest Ref Rng 10/27/2015 10/13/2015 10/06/2015  WBC 4.0 - 10.3 10e3/uL 11.8(H) 5.9 7.3  Hemoglobin 13.0 - 17.1 g/dL 13.7 13.0 13.6  Hematocrit 38.4 - 49.9 % 40.5 39.2 39.3  Platelets 140 - 400 10e3/uL 289 211 237     CMP Latest Ref Rng 10/27/2015 10/13/2015 10/06/2015  Glucose 70 - 140 mg/dl 118 125(H) 103  BUN 7.0 - 26.0 mg/dL 61.4(H) 31(H) 33.7(H)  Creatinine 0.7 - 1.3 mg/dL 1.2 1.24 1.1  Sodium 136 - 145 mEq/L 133(L) 137 138  Potassium 3.5 - 5.1 mEq/L 5.2(H) 4.6 4.5  Chloride 101 - 111 mmol/L - 107 -  CO2 22 - 29 mEq/L 17(L) 22 22  Calcium 8.4 - 10.4 mg/dL 8.9 9.6 9.6  Total Protein 6.4 - 8.3 g/dL 6.8 6.7 7.3  Total Bilirubin 0.20 - 1.20 mg/dL 0.56 0.7 <0.30  Alkaline Phos 40 - 150 U/L 71 55 77  AST 5 - 34 U/L 17 24 29   ALT 0 - 55 U/L 20 20 28    PATHOLOGY REPORT: Diagnosis 09/21/2015 Lymph node, needle/core biopsy, hypermetabolic left sub clavicular LN - METASTATIC MALIGNANT MELANOMA. - SEE COMMENT. Microscopic Comment The case was discussed with Dr. Alen Blew on 09/22/15. Additional studies can be performed upon clinician request. (JBK:gt, 09/22/15)  Diagnosis 10/15/2015 Brain, for tumor resection, Left frontal - METASTATIC MELANOMA. Microscopic Comment The tumor is a high grade malignancy with frequent mitotic figures and focal intracytoplasmic pigment. Immunohistochemistry shows strong positivity with S-100, HMB45 and Melan-A. (JDP:ecj 10/18/2015)      RADIOGRAPHIC STUDIES: I have personally reviewed the radiological images as listed and agreed with the findings in the report. No results found.   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   1. Metastatic melanoma to lung, node, bone and brain, BRAF V600K mutation (+) -I previously  reviewed his PET/CT scan images with pt and  his family members. The scan revealed hypermetabolic left upper lobe lung mass, 2 small right lung nodules, probable bone metastasis, and a subcutaneous left supraclavicular lesion, which is not palpable  -His repeat his CT had showed a 2.0 cm left frontal hyperdense lesion with enhancement on contrast, in the area of his recent hemorrhagic stroke, highly suspicious for brain metastasis.  -We discussed his left supraclavicular node biopsy results, which showed metastatic melanoma.. -His case was discussed in our sarcoma tumor   -I recommend a first-line treatment with Nivolumab alone, given his recent stroke, low tumor burden. I'll reserve BRAF inhibitor with or without MEK inhibitor as a second line therapy if he progress on immunotherapy. Other options of immunotherapy, especially checkpoint inhibitor with ipilumimab, were also discussed with pt and his daughter -He tolerated the first dose Nivolumab very well, no significant side effects. We'll continue -He is now status post SBRT and surgical resection to his solitary brain metastasis on 10/15/15 -Based on the recent date reported in ASCO 2017 one month ago (abstract [id number] and [id number]), which showed significant and meaningful improvement of response rate and PFS with combination of nivo and ipi than nivo alone for metastatic melanoma with brain metastasis. So I recommend him to consider adding ipilimumab, every 3 weeks for 4 cycles. Potential side effects, especially immune related colitis, skin rash, transaminitis, etc. were discussed with patient, he agrees to proceed. We'll start his next cycle -Lab results reviewed, adequate for treatment, we'll start first cycle Nivolumab today and continue every 2 weeks.  -I called  Dr. Melven Sartorius office and spoke with his assistance Brain who discharged pt last week, patient was supposed to stop dexamethasone after discharge, however he is still taking it 4 mg twice daily. I instructed patient to stop from now. He has been on it for 2 weeks, hopefully he will not have significant withdraw symptoms.   2. Hemorrhagic stroke and brain metastasis  -I encouraged him to continue physical therapy and occupational therapy. He has recovered  very well -History post SBRT and surgical resection of his solitary brain metastasis   Plan -We'll proceed with second cycle Nivolumab today -Return to clinic in 2 weeks to start Nivolumab (dose reduced to 1 mg/kg) and ipilimumab, he will see Dr. Alen Blew due to my absence  -I'll see him back in 3 weeks  -Stop dexamethasone from today    All questions were answered. The patient knows to call the clinic with any problems, questions or concerns. No barriers to learning was detected.  I spent 30 minutes counseling the patient face to face. The total time spent in the appointment was 35 minutes and more than 50% was on counseling and review of test results     [name], MD 10/27/2015

## 2015-10-27 NOTE — Progress Notes (Signed)
Patient informed to discontinue Dexamethasone, per Dr. Burr Medico.  Patient verbalized understanding.

## 2015-10-27 NOTE — Telephone Encounter (Signed)
Gave patient avs report and appointments for August. Per 7/19 pof chemo 2 weeks and 5 weeks and f/u with YF 3 weeks and 5 weeks - confirmed w/YF. Also per YF cxd appointments for 11/03/15.

## 2015-10-28 ENCOUNTER — Other Ambulatory Visit: Payer: Self-pay | Admitting: *Deleted

## 2015-10-29 ENCOUNTER — Telehealth: Payer: Self-pay | Admitting: *Deleted

## 2015-10-29 NOTE — Telephone Encounter (Signed)
Spoke with pt to see how he is doing today.  Per pt , he is doing fine.  Finally had a BM today.  Stated he would take Miralax again today.  Gave pt suggestions to take Senna-S 2 tabs daily, increased water intake as tolerated, eat prunes daily to help with bowel regimen.   Pt voiced understanding. Received call from daughter Silas [place] requesting nurse to call pt to discuss about bowel management. [facility]   Phone      [phone].

## 2015-11-03 NOTE — Telephone Encounter (Signed)
If patient daughter calls back she needs to r/s her appt with Dr. Erlinda Hong. The phone staff please r/s her thanks. Pt contacted Dr.Xu. Via text message.

## 2015-11-03 NOTE — Telephone Encounter (Signed)
  If patient calls back just r/s patient for a 2 month follow up with Dr. Erlinda Hong. Thanks   Rn spoke with patents [name]. [name] stated her father will be calling back this am to r/s appt with Dr. Erlinda Hong. Rn explain the phone staff can r/s patient if does not have to be the nurse. [name] verbalized understanding.

## 2015-11-04 ENCOUNTER — Ambulatory Visit: Payer: Medicare Other | Admitting: Neurology

## 2015-11-04 ENCOUNTER — Other Ambulatory Visit: Payer: Medicare Other

## 2015-11-04 ENCOUNTER — Ambulatory Visit: Payer: Medicare Other | Admitting: Hematology

## 2015-11-04 ENCOUNTER — Ambulatory Visit: Payer: Medicare Other

## 2015-11-05 ENCOUNTER — Telehealth: Payer: Self-pay

## 2015-11-05 NOTE — Telephone Encounter (Signed)
Called pt's home number and [name] w/ HH---lmovm for [name]---no VM for pt's number in chart

## 2015-11-05 NOTE — Telephone Encounter (Signed)
David [name] with [facility] left v/m; David [name] saw [name] earlier today and BP was 100-108 /75-80 sitting and standing BP 90-98 /75-80. P 80 and no fever. [name] had not taken any meds so far today. [name] said not feeling up to par the last couple of daysJatana request cb if [name] should take usual meds.  [name] has appt with David [name] on 11/30/15 as David [name] [name] to establish.

## 2015-11-05 NOTE — Telephone Encounter (Signed)
He is only on a low dose of Carvedilol. I would continue and have him follow up sooner if he continues not to feel well.

## 2015-11-05 NOTE — Telephone Encounter (Signed)
RN call patient back he has an appt on 12/28/2015 with Dr. Erlinda Hong at 0200pm.

## 2015-11-10 ENCOUNTER — Other Ambulatory Visit (HOSPITAL_BASED_OUTPATIENT_CLINIC_OR_DEPARTMENT_OTHER): Payer: Medicare Other

## 2015-11-10 ENCOUNTER — Ambulatory Visit (HOSPITAL_BASED_OUTPATIENT_CLINIC_OR_DEPARTMENT_OTHER): Payer: Medicare Other | Admitting: Oncology

## 2015-11-10 ENCOUNTER — Ambulatory Visit (HOSPITAL_BASED_OUTPATIENT_CLINIC_OR_DEPARTMENT_OTHER): Payer: Medicare Other

## 2015-11-10 VITALS — BP 130/83 | HR 77 | Temp 98.2°F | Resp 18 | Ht 69.0 in | Wt 193.7 lb

## 2015-11-10 DIAGNOSIS — Z5112 Encounter for antineoplastic immunotherapy: Secondary | ICD-10-CM | POA: Diagnosis present

## 2015-11-10 DIAGNOSIS — C7931 Secondary malignant neoplasm of brain: Secondary | ICD-10-CM | POA: Diagnosis not present

## 2015-11-10 DIAGNOSIS — C439 Malignant melanoma of skin, unspecified: Secondary | ICD-10-CM

## 2015-11-10 DIAGNOSIS — I629 Nontraumatic intracranial hemorrhage, unspecified: Secondary | ICD-10-CM

## 2015-11-10 DIAGNOSIS — C7951 Secondary malignant neoplasm of bone: Secondary | ICD-10-CM | POA: Diagnosis not present

## 2015-11-10 DIAGNOSIS — C799 Secondary malignant neoplasm of unspecified site: Secondary | ICD-10-CM

## 2015-11-10 DIAGNOSIS — C770 Secondary and unspecified malignant neoplasm of lymph nodes of head, face and neck: Secondary | ICD-10-CM

## 2015-11-10 DIAGNOSIS — Z79899 Other long term (current) drug therapy: Secondary | ICD-10-CM | POA: Diagnosis not present

## 2015-11-10 DIAGNOSIS — C7800 Secondary malignant neoplasm of unspecified lung: Secondary | ICD-10-CM

## 2015-11-10 LAB — CBC WITH DIFFERENTIAL/PLATELET
BASO%: 0.6 % (ref 0.0–2.0)
Basophils Absolute: 0 10*3/uL (ref 0.0–0.1)
EOS ABS: 0.2 10*3/uL (ref 0.0–0.5)
EOS%: 4.3 % (ref 0.0–7.0)
HCT: 38.3 % — ABNORMAL LOW (ref 38.4–49.9)
HGB: 12.8 g/dL — ABNORMAL LOW (ref 13.0–17.1)
LYMPH%: 8.6 % — AB (ref 14.0–49.0)
MCH: 30.1 pg (ref 27.2–33.4)
MCHC: 33.3 g/dL (ref 32.0–36.0)
MCV: 90.2 fL (ref 79.3–98.0)
MONO#: 0.6 10*3/uL (ref 0.1–0.9)
MONO%: 10.5 % (ref 0.0–14.0)
NEUT%: 76 % — AB (ref 39.0–75.0)
NEUTROS ABS: 4.1 10*3/uL (ref 1.5–6.5)
PLATELETS: 168 10*3/uL (ref 140–400)
RBC: 4.25 10*6/uL (ref 4.20–5.82)
RDW: 17 % — ABNORMAL HIGH (ref 11.0–14.6)
WBC: 5.3 10*3/uL (ref 4.0–10.3)
lymph#: 0.5 10*3/uL — ABNORMAL LOW (ref 0.9–3.3)

## 2015-11-10 LAB — COMPREHENSIVE METABOLIC PANEL
ALT: 21 U/L (ref 0–55)
ANION GAP: 8 meq/L (ref 3–11)
AST: 23 U/L (ref 5–34)
Albumin: 3.5 g/dL (ref 3.5–5.0)
Alkaline Phosphatase: 68 U/L (ref 40–150)
BUN: 33.4 mg/dL — ABNORMAL HIGH (ref 7.0–26.0)
CHLORIDE: 109 meq/L (ref 98–109)
CO2: 21 meq/L — AB (ref 22–29)
Calcium: 9.8 mg/dL (ref 8.4–10.4)
Creatinine: 1 mg/dL (ref 0.7–1.3)
EGFR: 73 mL/min/{1.73_m2} — AB (ref >90)
GLUCOSE: 87 mg/dL (ref 70–140)
POTASSIUM: 4.9 meq/L (ref 3.5–5.1)
SODIUM: 139 meq/L (ref 136–145)
TOTAL PROTEIN: 7 g/dL (ref 6.4–8.3)
Total Bilirubin: 0.44 mg/dL (ref 0.20–1.20)

## 2015-11-10 LAB — TSH: TSH: 5.307 m(IU)/L — ABNORMAL HIGH (ref 0.320–4.118)

## 2015-11-10 MED ORDER — ACETAMINOPHEN 500 MG PO TABS
1000.0000 mg | ORAL_TABLET | Freq: Once | ORAL | Status: AC
  Administered 2015-11-10: 1000 mg via ORAL

## 2015-11-10 MED ORDER — SODIUM CHLORIDE 0.9 % IV SOLN
240.0000 mg | Freq: Once | INTRAVENOUS | Status: AC
  Administered 2015-11-10: 240 mg via INTRAVENOUS
  Filled 2015-11-10: qty 4

## 2015-11-10 MED ORDER — SODIUM CHLORIDE 0.9 % IV SOLN
Freq: Once | INTRAVENOUS | Status: AC
  Administered 2015-11-10: 12:00:00 via INTRAVENOUS

## 2015-11-10 NOTE — Patient Instructions (Signed)
[place] Discharge Instructions for Patients Receiving Chemotherapy  Today you received the following chemotherapy agents:  Opdivo (Nivolumab)  To help prevent nausea and vomiting after your treatment, we encourage you to take your nausea medication as prescribed.   If you develop nausea and vomiting that is not controlled by your nausea medication, call the clinic.   BELOW ARE SYMPTOMS THAT SHOULD BE REPORTED IMMEDIATELY:  *FEVER GREATER THAN 100.5 F  *CHILLS WITH OR WITHOUT FEVER  NAUSEA AND VOMITING THAT IS NOT CONTROLLED WITH YOUR NAUSEA MEDICATION  *UNUSUAL SHORTNESS OF BREATH  *UNUSUAL BRUISING OR BLEEDING  TENDERNESS IN MOUTH AND THROAT WITH OR WITHOUT PRESENCE OF ULCERS  *URINARY PROBLEMS  *BOWEL PROBLEMS  UNUSUAL RASH Items with * indicate a potential emergency and should be followed up as soon as possible.  Feel free to call the clinic you have any questions or concerns. The clinic phone number is [phone].  Please show the [place] at check-in to the Emergency Department and triage nurse.

## 2015-11-10 NOTE — Progress Notes (Signed)
[place]  Telephone:(336) [phone] Fax:(336) [phone]  Clinic Follow up Note   Patient Care Team: [name], MD as PCP - General (Internal Medicine) 11/10/2015  Chief complaint: Follow-up metastatic melanoma  Oncology History   Metastatic melanoma [facility])   Staging form: Melanoma of the Skin, AJCC 7th Edition     Clinical stage from 09/21/2015: Stage IV [place], [place], [place]) - Signed by [name], MD on 10/09/2015       Metastatic melanoma ([place])   09/16/2015 Imaging    PET scan showed a hypermetabolic 8.5UD lingular nodule, and a 43m right upper lobe lung nodule, left subclavicular nodes, and bone metastasis in the proximal right humerus and right femur.      09/21/2015 Initial Diagnosis    Metastatic melanoma ([place]     09/21/2015 Initial Biopsy    Left subclavicular lymph node biopsy showed prostatic of melanoma     09/21/2015 Miscellaneous    BRAF V600K mutation (+)      10/04/2015 Imaging    CT head with without contrast showed a 2.0 x 1.6 x 1.7 cm superficial left frontal hyperdense lesion, with postcontrast enhancement, lying within the previous hemorrhagic area, concerning for solitary melanoma metastasis     10/06/2015 -  Chemotherapy    Nivolumab 2470mevery 2 weeks      10/13/2015 - 10/13/2015 Radiation Therapy    SRS to his left frontal solitary brain metastasis      10/15/2015 Surgery    left front craniotomy and tumor excision      10/15/2015 Pathology Results    left frontal brain mass excision showed metastatic melanoma, 1.5X1.5X0.6cm      History of present illness (09/02/2015): Pt is a [age].o. Retired dePharmacist, communityith history of HTN, OSA, CKD, PPM who was admitted on 08/22/15 with expressive difficulty. He was found to have left frontal hemorrhage 5.7 cm x 4.5 cm with 6 mm left to right shift.During the workup for his stroke, a left upper lung mass was noticed on the CT scan. I was called to evaluate his lung mass.  Patient has aphasia, not able to  communicate much. According to his daughter, Dr. LeGala Romneywho is a OB GYN at [facility] probably only smoked for very short period time during the college. He did have 3 skin melanoma, which was removed, last one was 2-3 years ago. No other cancer history.  CURRENT THERAPY: Nivolumab 240 mg, every 2 weeks, started on 10/06/2015  INTERVAL HISTORY: Mr. HaKibbeeturns for follow-up. He is accompanied by his wife to my clinic today. Since the last visit, he reports no major changes in his health. He tolerated Nivolumab without major toxicities. He has reported some mild fatigue and mild diarrhea. He is reporting some pain around his left gluteal muscle. His diarrhea has been manageable although this morning he describes as more frequent.  He does not report any new neurological deficits. He denied any headaches, blurry vision, syncope or seizures. He denied any major changes in his appetite. He gained some weight.   REVIEW OF SYSTEMS:   Constitutional: Denies fevers, chills or abnormal weight loss Eyes: Denies blurriness of vision Ears, nose, mouth, throat, and face: Denied any dysphagia or oral ulcers. Respiratory: Denies cough, dyspnea or wheezes Cardiovascular: Denies palpitation, chest discomfort or lower extremity swelling Gastrointestinal:  Denies nausea, heartburn or change in bowel habits Skin: Denies abnormal skin rashes Lymphatics: Denies new lymphadenopathy or easy bruising Neurological:see HPI  Behavioral/Psych: Mood is stable, not  any anxiety or depression. All other systems were reviewed with the patient and are negative.  MEDICAL HISTORY:  Past Medical History:  Diagnosis Date  . Anginal pain ([place])   . Arthritis    mostly hands  . Benign familial hematuria   . BPH (benign prostatic hypertrophy)   . Brain cancer ([place])    melanoma with brain met  . Coronary artery disease   . GERD (gastroesophageal reflux disease)   . High cholesterol   . Hypertension   .  Hypothyroidism   . Intracerebral hemorrhage ([place])   . Melanoma ([place])   . Metastatic melanoma to lung ([place])   . Mood disorder ([place])   . Myocardial infarction ([place])   . Pacemaker    due to syncope, 3rd degree HB (upgrade to [facility]. Jude CRT-P 02/25/13 (Dr. Uvaldo Rising)  . Rectal bleed    due to NSAIDS  . Renal disorder   . Skin cancer    melanoma  . Sleep apnea    uses CPAP  . Stroke [facility])     SURGICAL HISTORY: Past Surgical History:  Procedure Laterality Date  . APPLICATION OF CRANIAL NAVIGATION N/A 10/15/2015   Procedure: APPLICATION OF CRANIAL NAVIGATION;  Surgeon: Erline Levine, MD;  Location: [place] NEURO ORS;  Service: Neurosurgery;  Laterality: N/A;  . CARDIAC CATHETERIZATION  2013  . CARDIAC SURGERY     bypass X 2  . CORONARY ARTERY BYPASS GRAFT  2013   LIMA-LAD, SVG-PDA 03/27/11 (Dr. Francee Gentile)  . CRANIOTOMY N/A 10/15/2015   Procedure: LEFT FRONTAL CRANIOTOMY TUMOR EXCISION with Curve;  Surgeon: Erline Levine, MD;  Location: [place] NEURO ORS;  Service: Neurosurgery;  Laterality: N/A;  CRANIOTOMY TUMOR EXCISION  . EYE SURGERY    . FOOT SURGERY  08/2010  . JOINT REPLACEMENT Left    partial knee  . KNEE ARTHROSCOPY  12/2008  . LYMPH NODE BIOPSY    . PACEMAKER INSERTION    . TONSILLECTOMY      I have reviewed the social history and family history with the patient and they are unchanged from previous note.  ALLERGIES:  is allergic to zocor [simvastatin].  MEDICATIONS:  Current Outpatient Prescriptions  Medication Sig Dispense Refill  . acetaminophen (TYLENOL) 325 MG tablet Take 2 tablets (650 mg total) by mouth every 6 (six) hours as needed for mild pain. (Patient taking differently: Take 650 mg by mouth 3 (three) times daily. )    . alfuzosin (UROXATRAL) 10 MG 24 hr tablet Take 1 tablet by mouth daily.  0  . carvedilol (COREG) 3.125 MG tablet Take 3.125 mg by mouth 2 (two) times daily with a meal.    . dexamethasone (DECADRON) 4 MG tablet Take 1 tablet (4 mg total) by mouth 2 (two) times  daily. 60 tablet 0  . levothyroxine (SYNTHROID, LEVOTHROID) 125 MCG tablet Take 125 mcg by mouth daily before breakfast.    . Multiple Vitamin (MULTIVITAMIN WITH MINERALS) TABS tablet Take 1 tablet by mouth daily.    Marland Kitchen PRESCRIPTION MEDICATION Inhale into the lungs at bedtime. CPAP    . rosuvastatin (CRESTOR) 10 MG tablet Take 10 mg by mouth at bedtime.    . senna-docusate (SENOKOT-S) 8.6-50 MG tablet Take 1 tablet by mouth 2 (two) times daily.    . sertraline (ZOLOFT) 50 MG tablet Take 2.5 tablets (125 mg total) by mouth daily.    . Turmeric Curcumin 500 MG CAPS Take 500 mg by mouth 2 (two) times daily.     No current facility-administered medications  for this visit.     PHYSICAL EXAMINATION: ECOG PERFORMANCE STATUS: 3  Vitals:   11/10/15 1053  BP: 130/83  Pulse: 77  Resp: 18  Temp: 98.2 F (36.8 C)   Filed Weights   11/10/15 1053  Weight: 193 lb 11.2 oz (87.9 kg)    GENERAL:Alert, awake gentleman appeared without distress. SKIN: skin color, texture, turgor are normal, no rashes or significant lesions EYES: normal, Conjunctiva are pink and non-injected, sclera clear OROPHARYNX:no oral thrush noted. NECK: supple, thyroid normal size, non-tender, without nodularity LYMPH:  no palpable lymphadenopathy in the cervical, [place], axillary or inguinal LUNGS: clear to auscultation and percussion with normal breathing effort HEART: regular rate & rhythm and no murmurs and no lower extremity edema ABDOMEN:abdomen soft, non-tender and normal bowel sounds Musculoskeletal:no cyanosis of digits and no clubbing  NEURO: alert & oriented x 3 with fluent speech, no focal motor/sensory deficits, he walks with a cane independently, although his gait is slightly wobbly.   LABORATORY DATA:  I have reviewed the data as listed CBC Latest Ref Rng & Units 11/10/2015 10/27/2015 10/13/2015  WBC 4.0 - 10.3 10e3/uL 5.3 11.8(H) 5.9  Hemoglobin 13.0 - 17.1 g/dL 12.8(L) 13.7 13.0  Hematocrit 38.4 - 49.9 % 38.3(L)  40.5 39.2  Platelets 140 - 400 10e3/uL 168 289 211     CMP Latest Ref Rng & Units 11/10/2015 10/27/2015 10/13/2015  Glucose 70 - 140 mg/dl 87 118 125(H)  BUN 7.0 - 26.0 mg/dL 33.4(H) 61.4(H) 31(H)  Creatinine 0.7 - 1.3 mg/dL 1.0 1.2 1.24  Sodium 136 - 145 mEq/L 139 133(L) 137  Potassium 3.5 - 5.1 mEq/L 4.9 5.2(H) 4.6  Chloride 101 - 111 mmol/L - - 107  CO2 22 - 29 mEq/L 21(L) 17(L) 22  Calcium 8.4 - 10.4 mg/dL 9.8 8.9 9.6  Total Protein 6.4 - 8.3 g/dL 7.0 6.8 6.7  Total Bilirubin 0.20 - 1.20 mg/dL 0.44 0.56 0.7  Alkaline Phos 40 - 150 U/L 68 71 55  AST 5 - 34 U/L _0 ALT 0 - 55 U/L _1 PATHOLOGY REPORT: Diagnosis 09/21/2015 Lymph node, needle/core biopsy, hypermetabolic left sub clavicular LN - METASTATIC MALIGNANT MELANOMA. - SEE COMMENT. Microscopic Comment The case was discussed with Dr. Alen Blew on 09/22/15. Additional studies can be performed upon clinician request. (JBK:gt, 09/22/15)  Diagnosis 10/15/2015 Brain, for tumor resection, Left frontal - METASTATIC MELANOMA. Microscopic Comment The tumor is a high grade malignancy with frequent mitotic figures and focal intracytoplasmic pigment. Immunohistochemistry shows strong positivity with S-100, HMB45 and Melan-A. 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   1. Metastatic melanoma to lung, node, bone and brain, BRAF V600K mutation (+)  The natural course of this disease was reviewed again with the patient and his wife. His case has been discussed and the multidisciplinary tumor board for melanoma and sarcoma. Imaging studies have been reviewed personally and his case had been discussed with his daughter over the phone previously.  He is currently receiving Nivolumab as a single agent and we have discussed the risks and benefits of adding  ipilimumab as well. Comp capacious issue with this medication were reviewed which include worsening diarrhea, fatigue, tiredness, other autoimmune related complications. These would include dermatitis, thyroid disease and most importantly colitis. I also explained to him the importance of reporting diarrhea and  quantifying his diarrhea is critical in managing this drug.  After discussion today, it was a mutual decision to continue with single agent Nivolumab and defer adding ipilimumab to a later date. I feel he is clinical status is improving but he is still rather frail. I feel that combination therapy might be too much for him at this time and he agrees.   So the plan is to proceed Nivolumab as scheduled and defer adding ipilimumab to a later date.  2. Hemorrhagic stroke and brain metastasis  -I encouraged him to continue physical therapy and occupational therapy. He has recovered  very well -History post SBRT and surgical resection of his solitary brain metastasis  We also have discussed that combination therapy of Nivolumab and ipilimumab has an excellent response rate in the CNS which is a reasonable thing to consider down the line.  3. Follow-up: He will follow-up in 1 week with Dr. Morey Hummingbird his primary oncologist for ongoing care.     [facility], MD 11/10/15

## 2015-11-11 ENCOUNTER — Telehealth: Payer: Self-pay | Admitting: *Deleted

## 2015-11-11 NOTE — Telephone Encounter (Signed)
Patient calling to c/o pain in left buttock, gluteal area. Last night he had to stand to eat dinner, unable to sit down, takes tylenol every 4 hours. Cannot take nsaids. Pain is stationary in nature, does not radiate down his leg. Denies fever,swelling, redness nor is it hot to touch. patient wonders if it is related to nivolumab? Note to dr Alen Blew.

## 2015-11-12 ENCOUNTER — Telehealth: Payer: Self-pay | Admitting: *Deleted

## 2015-11-12 NOTE — Telephone Encounter (Signed)
Spoke with patient, dr Alen Blew does not think the discomfort is coming from the nivolumab, but from the cancer itself. He agrees with using oxycodone, patient will c.ll me back this afternoon to report on his progress.

## 2015-11-12 NOTE — Telephone Encounter (Signed)
1430 patient calling back to say that the oxycodone has helped his gluteal pain, note to dr Alen Blew.

## 2015-11-12 NOTE — Telephone Encounter (Signed)
Patient calling to say he still has discomfort in his left gluteal muscle and will try oxycodone and call me back if that helps. He just wonders if it is caused by the nivolumab?

## 2015-11-12 NOTE — Telephone Encounter (Signed)
I do not think this is related to his treatment but could be related to cancer. I agree with Oxycodone for now.

## 2015-11-12 NOTE — Telephone Encounter (Signed)
No note

## 2015-11-17 ENCOUNTER — Ambulatory Visit: Payer: Medicare Other | Admitting: Hematology

## 2015-11-17 ENCOUNTER — Other Ambulatory Visit: Payer: Medicare Other

## 2015-11-17 ENCOUNTER — Other Ambulatory Visit: Payer: Self-pay | Admitting: *Deleted

## 2015-11-17 ENCOUNTER — Telehealth: Payer: Self-pay | Admitting: Hematology

## 2015-11-17 ENCOUNTER — Telehealth: Payer: Self-pay | Admitting: *Deleted

## 2015-11-17 DIAGNOSIS — C439 Malignant melanoma of skin, unspecified: Secondary | ICD-10-CM

## 2015-11-17 DIAGNOSIS — C799 Secondary malignant neoplasm of unspecified site: Secondary | ICD-10-CM

## 2015-11-17 DIAGNOSIS — Z9189 Other specified personal risk factors, not elsewhere classified: Secondary | ICD-10-CM

## 2015-11-17 DIAGNOSIS — C7931 Secondary malignant neoplasm of brain: Secondary | ICD-10-CM

## 2015-11-17 NOTE — Telephone Encounter (Signed)
Spoke with pt today and instructed pt NOT to come in for appts today per Dr. Burr Medico.  Informed pt that a scheduler will contact pt with rescheduled appts for next week.  Pt voiced understanding. Spoke with daughter Dr. Silas [place], and informed her of above info.  Dr. [name] requested added labs for next week with Vit D.  Order placed as ok per Dr. Burr Medico.

## 2015-11-17 NOTE — Telephone Encounter (Signed)
spoke w/ pt confirmed 8/16 apt times

## 2015-11-19 ENCOUNTER — Other Ambulatory Visit: Payer: Self-pay | Admitting: Internal Medicine

## 2015-11-22 ENCOUNTER — Encounter: Payer: Self-pay | Admitting: Radiation Oncology

## 2015-11-22 ENCOUNTER — Ambulatory Visit
Admission: RE | Admit: 2015-11-22 | Discharge: 2015-11-22 | Disposition: A | Discharge status: Home / Self Care | Payer: Medicare Other | Source: Ambulatory Visit | Attending: Radiation Oncology | Admitting: Radiation Oncology

## 2015-11-22 VITALS — BP 127/82 | HR 79 | Resp 16 | Wt 194.8 lb

## 2015-11-22 DIAGNOSIS — C439 Malignant melanoma of skin, unspecified: Secondary | ICD-10-CM | POA: Diagnosis present

## 2015-11-22 DIAGNOSIS — C7931 Secondary malignant neoplasm of brain: Secondary | ICD-10-CM | POA: Insufficient documentation

## 2015-11-22 DIAGNOSIS — M5430 Sciatica, unspecified side: Secondary | ICD-10-CM | POA: Diagnosis not present

## 2015-11-22 DIAGNOSIS — M1990 Unspecified osteoarthritis, unspecified site: Secondary | ICD-10-CM | POA: Diagnosis not present

## 2015-11-22 DIAGNOSIS — C799 Secondary malignant neoplasm of unspecified site: Secondary | ICD-10-CM

## 2015-11-22 DIAGNOSIS — Y842 Radiological procedure and radiotherapy as the cause of abnormal reaction of the patient, or of later complication, without mention of misadventure at the time of the procedure: Secondary | ICD-10-CM | POA: Diagnosis not present

## 2015-11-22 NOTE — Progress Notes (Addendum)
Radiation Oncology         [phone]) [phone] ________________________________  Name: David [name] MRN: [id number]  Date: 11/22/2015  DOB: December 16, 1940  Follow-Up Visit Note  CC: [name], MD  [name], MD  Follow Up  Diagnosis:   Metastasis to brain.  No diagnosis found.  Interval Since Last Radiation: 1.5 months.   10/13/2015: Preop SRS to the left frontal lesion to 14 Gy.  Narrative:  In summary this is a very pleasant 75 y.o. male with a history of metastatic melanoma. He has been on Nivolumab since June 2017. He was found to have a left frontal solitary brain metastases for which he received preoperative SRS on 10/13/2015 followed by left frontal craniotomy and tumor excision on 10/15/2015. Final pathology confirmed metastatic melanoma. He comes today for follow-up.  On review of systems, the patient reports that in the last 2 weeks he has been experiencing terrible sciatica in his lower extremity. He describes this as being sharp and shooting pain down the back of his right lower extremity. He is working with physical therapy and is content to continue this for now. He denies any chest pain or shortness of breath. He has been using a walker at home but states that he feels stable enough to beyond about at times without this. No other complaints or verbalized.  ALLERGIES:  is allergic to zocor [simvastatin].  Meds: Current Outpatient Prescriptions  Medication Sig Dispense Refill  . acetaminophen (TYLENOL) 325 MG tablet Take 2 tablets (650 mg total) by mouth every 6 (six) hours as needed for mild pain. (Patient taking differently: Take 650 mg by mouth 3 (three) times daily. )    . alfuzosin (UROXATRAL) 10 MG 24 hr tablet TAKE 1 TABLET BY MOUTH EVERY DAY AT BREAKFAST 30 tablet 0  . carvedilol (COREG) 3.125 MG tablet Take 3.125 mg by mouth 2 (two) times daily with a meal.    . levothyroxine (SYNTHROID, LEVOTHROID) 125 MCG tablet TAKE 1 TABLET BY MOUTH EVERY MORNING BEFORE BREAKFAST  30 tablet 11  . Multiple Vitamin (MULTIVITAMIN WITH MINERALS) TABS tablet Take 1 tablet by mouth daily.    . polyethylene glycol (MIRALAX / GLYCOLAX) packet Take 17 g by mouth daily.    Marland Kitchen PRESCRIPTION MEDICATION Inhale into the lungs at bedtime. CPAP    . rosuvastatin (CRESTOR) 10 MG tablet Take 10 mg by mouth at bedtime.    . selenium sulfide (SELSUN) 2.5 % shampoo     . senna-docusate (SENOKOT-S) 8.6-50 MG tablet Take 1 tablet by mouth 2 (two) times daily.    . sertraline (ZOLOFT) 50 MG tablet Take 2.5 tablets (125 mg total) by mouth daily. (Patient taking differently: Take 100 mg by mouth daily. )    . Turmeric Curcumin 500 MG CAPS Take 500 mg by mouth 2 (two) times daily.     No current facility-administered medications for this encounter.     Physical Findings:  weight is 194 lb 12.8 oz (88.4 kg). His blood pressure is 127/82 and his pulse is 79. His respiration is 16 and oxygen saturation is 100%.   In general this is a well appearing Caucasian male in no acute distress. He's alert and oriented x4 and appropriate throughout the examination. Cardiopulmonary assessment is negative for acute distress and he exhibits normal effort.    Lab Findings: Lab Results  Component Value Date   WBC 5.3 11/10/2015   WBC 5.9 10/13/2015   HGB 12.8 (L) 11/10/2015   HCT 38.3 (L) 11/10/2015  PLT 168 11/10/2015    Lab Results  Component Value Date   NA 139 11/10/2015   K 4.9 11/10/2015   CHLORIDE 109 11/10/2015   CO2 21 (L) 11/10/2015   GLUCOSE 87 11/10/2015   BUN 33.4 (H) 11/10/2015   CREATININE 1.0 11/10/2015   BILITOT 0.44 11/10/2015   ALKPHOS 68 11/10/2015   AST 23 11/10/2015   ALT 21 11/10/2015   PROT 7.0 11/10/2015   ALBUMIN 3.5 11/10/2015   CALCIUM 9.8 11/10/2015   ANIONGAP 8 11/10/2015   ANIONGAP 8 10/13/2015    Radiographic Findings: No results found.  Impression/Plan: 1. Metastatic Melanoma to brain. The patient is recovering from the effects of radiation. He continues  on Nivolumab and is planning to follow up with Medical oncology in a few weeks for his next course of this. We will plan his first post treatment CT scan in 3 months time. He states agreement and understanding of this plan. He will call if he has any questions or concerns prior to his next visit following his CT. 2. Osteoarthritis and sciatica. The patient has been working with physical therapy and is interested in continuing with this  to see if that helps his sciatica for a few weeks prior to considering a CT scan or myelogram (as he is unable to have MRI).      Carola Rhine, [facility] Seen with the dictated for Sheral Apley. Tammi Klippel, M.D.    This document serves as a record of services personally performed by Dr. Tammi Klippel, MD and [name], [facility] It was created on their behalf by Lendon Collar, a trained medical scribe. The creation of this record is based on the scribe's personal observations and the provider's statements to them. This document has been checked and approved by the attending provider.

## 2015-11-22 NOTE — Progress Notes (Signed)
Weight and vitals stable. Reports arthritic pain. Reports his strength is slowly returning. Denies taking any decadron at this time. Reports he tapered off decadron approximately three weeks ago. Scalp incision well healed. Reports his gait remains unsteady thus he uses a walker. Reports he failed to bring his walker today, though. Reports occasional mild dizziness. Reports diminished vision in his left eye but, denies diplopia. Reports intermittent tinnitus. Reports occasional nausea but, no vomiting. Stuttered occasionally while struggling to remember. Confirms his appetite is normal.   BP 127/82   Pulse 79   Resp 16   Wt 194 lb 12.8 oz (88.4 kg)   SpO2 100%   BMI 28.77 kg/m  Wt Readings from Last 3 Encounters:  11/22/15 194 lb 12.8 oz (88.4 kg)  11/10/15 193 lb 11.2 oz (87.9 kg)  10/27/15 188 lb 12.8 oz (85.6 kg)

## 2015-11-23 NOTE — Addendum Note (Signed)
Encounter addended by: Heywood Footman, RN on: 11/23/2015 10:53 AM<BR>    Actions taken: Charge Capture section accepted

## 2015-11-24 ENCOUNTER — Other Ambulatory Visit: Payer: Self-pay | Admitting: *Deleted

## 2015-11-24 ENCOUNTER — Other Ambulatory Visit (HOSPITAL_BASED_OUTPATIENT_CLINIC_OR_DEPARTMENT_OTHER): Payer: Medicare Other

## 2015-11-24 ENCOUNTER — Ambulatory Visit (HOSPITAL_BASED_OUTPATIENT_CLINIC_OR_DEPARTMENT_OTHER): Payer: Medicare Other

## 2015-11-24 ENCOUNTER — Ambulatory Visit (HOSPITAL_BASED_OUTPATIENT_CLINIC_OR_DEPARTMENT_OTHER): Payer: Medicare Other | Admitting: Hematology

## 2015-11-24 VITALS — BP 135/84 | HR 77 | Temp 98.0°F | Resp 18 | Ht 69.0 in | Wt 195.5 lb

## 2015-11-24 DIAGNOSIS — Z79899 Other long term (current) drug therapy: Secondary | ICD-10-CM | POA: Diagnosis not present

## 2015-11-24 DIAGNOSIS — Z8673 Personal history of transient ischemic attack (TIA), and cerebral infarction without residual deficits: Secondary | ICD-10-CM

## 2015-11-24 DIAGNOSIS — C7931 Secondary malignant neoplasm of brain: Secondary | ICD-10-CM

## 2015-11-24 DIAGNOSIS — C770 Secondary and unspecified malignant neoplasm of lymph nodes of head, face and neck: Secondary | ICD-10-CM

## 2015-11-24 DIAGNOSIS — R5383 Other fatigue: Secondary | ICD-10-CM | POA: Diagnosis not present

## 2015-11-24 DIAGNOSIS — Z5112 Encounter for antineoplastic immunotherapy: Secondary | ICD-10-CM

## 2015-11-24 DIAGNOSIS — C439 Malignant melanoma of skin, unspecified: Secondary | ICD-10-CM

## 2015-11-24 DIAGNOSIS — I619 Nontraumatic intracerebral hemorrhage, unspecified: Secondary | ICD-10-CM

## 2015-11-24 DIAGNOSIS — Z9189 Other specified personal risk factors, not elsewhere classified: Secondary | ICD-10-CM

## 2015-11-24 DIAGNOSIS — C799 Secondary malignant neoplasm of unspecified site: Secondary | ICD-10-CM

## 2015-11-24 DIAGNOSIS — C7951 Secondary malignant neoplasm of bone: Secondary | ICD-10-CM

## 2015-11-24 DIAGNOSIS — C7800 Secondary malignant neoplasm of unspecified lung: Secondary | ICD-10-CM

## 2015-11-24 DIAGNOSIS — E349 Endocrine disorder, unspecified: Secondary | ICD-10-CM

## 2015-11-24 DIAGNOSIS — E039 Hypothyroidism, unspecified: Secondary | ICD-10-CM

## 2015-11-24 DIAGNOSIS — I10 Essential (primary) hypertension: Secondary | ICD-10-CM

## 2015-11-24 LAB — COMPREHENSIVE METABOLIC PANEL
ALK PHOS: 62 U/L (ref 40–150)
ALT: 18 U/L (ref 0–55)
ANION GAP: 8 meq/L (ref 3–11)
AST: 22 U/L (ref 5–34)
Albumin: 3.3 g/dL — ABNORMAL LOW (ref 3.5–5.0)
BILIRUBIN TOTAL: 0.36 mg/dL (ref 0.20–1.20)
BUN: 29 mg/dL — AB (ref 7.0–26.0)
CHLORIDE: 107 meq/L (ref 98–109)
CO2: 24 mEq/L (ref 22–29)
CREATININE: 0.9 mg/dL (ref 0.7–1.3)
Calcium: 9.9 mg/dL (ref 8.4–10.4)
EGFR: 79 mL/min/{1.73_m2} — ABNORMAL LOW (ref >90)
GLUCOSE: 78 mg/dL (ref 70–140)
Potassium: 5.1 mEq/L (ref 3.5–5.1)
Sodium: 139 mEq/L (ref 136–145)
Total Protein: 6.9 g/dL (ref 6.4–8.3)

## 2015-11-24 LAB — CBC WITH DIFFERENTIAL/PLATELET
BASO%: 0.5 % (ref 0.0–2.0)
Basophils Absolute: 0 10*3/uL (ref 0.0–0.1)
EOS%: 2.1 % (ref 0.0–7.0)
Eosinophils Absolute: 0.1 10*3/uL (ref 0.0–0.5)
HCT: 35.5 % — ABNORMAL LOW (ref 38.4–49.9)
HGB: 11.7 g/dL — ABNORMAL LOW (ref 13.0–17.1)
LYMPH%: 8.9 % — AB (ref 14.0–49.0)
MCH: 29.9 pg (ref 27.2–33.4)
MCHC: 33 g/dL (ref 32.0–36.0)
MCV: 90.6 fL (ref 79.3–98.0)
MONO#: 0.5 10*3/uL (ref 0.1–0.9)
MONO%: 10.4 % (ref 0.0–14.0)
NEUT#: 3.8 10*3/uL (ref 1.5–6.5)
NEUT%: 78.1 % — AB (ref 39.0–75.0)
Platelets: 312 10*3/uL (ref 140–400)
RBC: 3.92 10*6/uL — AB (ref 4.20–5.82)
RDW: 16.4 % — ABNORMAL HIGH (ref 11.0–14.6)
WBC: 4.9 10*3/uL (ref 4.0–10.3)
lymph#: 0.4 10*3/uL — ABNORMAL LOW (ref 0.9–3.3)

## 2015-11-24 LAB — TSH: TSH: 0.129 m[IU]/L — AB (ref 0.320–4.118)

## 2015-11-24 MED ORDER — SODIUM CHLORIDE 0.9 % IV SOLN
240.0000 mg | Freq: Once | INTRAVENOUS | Status: AC
  Administered 2015-11-24: 240 mg via INTRAVENOUS
  Filled 2015-11-24: qty 20

## 2015-11-24 MED ORDER — SODIUM CHLORIDE 0.9 % IV SOLN
Freq: Once | INTRAVENOUS | Status: AC
  Administered 2015-11-24: 15:00:00 via INTRAVENOUS

## 2015-11-24 NOTE — Progress Notes (Addendum)
David [name]  [phone] [phone]  Clinic Follow up Note   Patient Care Team: [name], MD as PCP - General (Internal Medicine) 11/25/2015  Chief complaint: Follow-up metastatic melanoma  Oncology History   Metastatic melanoma [facility])   Staging form: Melanoma of the Skin, AJCC 7th Edition     Clinical stage from 09/21/2015: Stage IV [place], [place], [place]) - Signed by [name], MD on 10/09/2015       Metastatic melanoma ([place])   09/16/2015 Imaging    PET scan showed a hypermetabolic 8.5ID lingular nodule, and a 34m right upper lobe lung nodule, left subclavicular nodes, and bone metastasis in the proximal right humerus and right femur.      09/21/2015 Initial Diagnosis    Metastatic melanoma ([place]     09/21/2015 Initial Biopsy    Left subclavicular lymph node biopsy showed prostatic of melanoma     09/21/2015 Miscellaneous    BRAF V600K mutation (+)      10/04/2015 Imaging    CT head with without contrast showed a 2.0 x 1.6 x 1.7 cm superficial left frontal hyperdense lesion, with postcontrast enhancement, lying within the previous hemorrhagic area, concerning for solitary melanoma metastasis     10/06/2015 -  Chemotherapy    Nivolumab 2431mevery 2 weeks      10/13/2015 - 10/13/2015 Radiation Therapy    SRS to his left frontal solitary brain metastasis      10/15/2015 Surgery    left front craniotomy and tumor excision      10/15/2015 Pathology Results    left frontal brain mass excision showed metastatic melanoma, 1.5X1.5X0.6cm      History of present illness (09/02/2015): Pt is a [age].o. Retired dePharmacist, communityith history of HTN, OSA, CKD, PPM who was admitted on 08/22/15 with expressive difficulty. He was found to have left frontal hemorrhage 5.7 cm x 4.5 cm with 6 mm left to right shift.During the workup for his stroke, a left upper lung mass was noticed on the CT scan. I was called to evaluate his lung mass.  Patient has aphasia, not able to  communicate much. According to his daughter, Dr. LeGala Romneywho is a OB GYN at [facility] probably only smoked for very short period time during the college. He did have 3 skin melanoma, which was removed, last one was 2-3 years ago. No other cancer history.  CURRENT THERAPY: Nivolumab 240 mg, every 2 weeks, started on 10/06/2015  INTERVAL HISTORY: Mr. HaMonteforteeturns for follow-up and nivolumab therapy. He complains of worsening fatigue, especially in the past one week. He noticed sudden worsening bilateral weakness on his legs and arms one week ago, he pretty much stayed in chair and bed on the day, he denies any focal neuro symptoms, his speech has been normal. No headaches or vision change. His generalized weakness has slightly improved today after, but overall he feels exhausted. He is able to take care of himself, and does go to [place] 2[place]3 times a week for exercise. He used to be on the testosterone injection every 2 weeks by his primary care physician for hypotestosteronemia, he has not had since his cancer diagnosis. He is also on Synthroid for his hypothyroidism.  REVIEW OF SYSTEMS:  Constitutional: Denies fevers, chills or abnormal weight loss, (+) profound fatigue Eyes: Denies blurriness of vision Ears, nose, mouth, throat, and face: Denies mucositis or sore throat Respiratory: Denies cough, dyspnea or wheezes Cardiovascular: Denies palpitation, chest discomfort or lower extremity  swelling Gastrointestinal:  Denies nausea, heartburn or change in bowel habits Skin: Denies abnormal skin rashes Lymphatics: Denies new lymphadenopathy or easy bruising Neurological:see HPI  Behavioral/Psych: Mood is stable, no new changes  All other systems were reviewed with the patient and are negative.  MEDICAL HISTORY:  Past Medical History:  Diagnosis Date  . Anginal pain ([place])   . Arthritis    mostly hands  . Benign familial hematuria   . BPH (benign prostatic hypertrophy)   . Brain cancer  ([place])    melanoma with brain met  . Coronary artery disease   . GERD (gastroesophageal reflux disease)   . High cholesterol   . Hypertension   . Hypothyroidism   . Intracerebral hemorrhage ([place])   . Melanoma ([place])   . Metastatic melanoma to lung ([place])   . Mood disorder ([place])   . Myocardial infarction ([place])   . Pacemaker    due to syncope, 3rd degree HB (upgrade to [name]. Jude CRT-P 02/25/13 (Dr. Uvaldo Rising)  . Rectal bleed    due to NSAIDS  . Renal disorder   . Skin cancer    melanoma  . Sleep apnea    uses CPAP  . Stroke [facility])     SURGICAL HISTORY: Past Surgical History:  Procedure Laterality Date  . APPLICATION OF CRANIAL NAVIGATION N/A 10/15/2015   Procedure: APPLICATION OF CRANIAL NAVIGATION;  Surgeon: Erline Levine, MD;  Location: [place] NEURO ORS;  Service: Neurosurgery;  Laterality: N/A;  . CARDIAC CATHETERIZATION  2013  . CARDIAC SURGERY     bypass X 2  . CORONARY ARTERY BYPASS GRAFT  2013   LIMA-LAD, SVG-PDA 03/27/11 (Dr. Francee Gentile)  . CRANIOTOMY N/A 10/15/2015   Procedure: LEFT FRONTAL CRANIOTOMY TUMOR EXCISION with Curve;  Surgeon: Erline Levine, MD;  Location: [place] NEURO ORS;  Service: Neurosurgery;  Laterality: N/A;  CRANIOTOMY TUMOR EXCISION  . EYE SURGERY    . FOOT SURGERY  08/2010  . JOINT REPLACEMENT Left    partial knee  . KNEE ARTHROSCOPY  12/2008  . LYMPH NODE BIOPSY    . PACEMAKER INSERTION    . TONSILLECTOMY      I have reviewed the social history and family history with the patient and they are unchanged from previous note.  ALLERGIES:  is allergic to zocor [simvastatin].  MEDICATIONS:  Current Outpatient Prescriptions  Medication Sig Dispense Refill  . acetaminophen (TYLENOL) 325 MG tablet Take 2 tablets (650 mg total) by mouth every 6 (six) hours as needed for mild pain. (Patient taking differently: Take 650 mg by mouth 3 (three) times daily. )    . alfuzosin (UROXATRAL) 10 MG 24 hr tablet TAKE 1 TABLET BY MOUTH EVERY DAY AT BREAKFAST 30 tablet 0  . carvedilol  (COREG) 3.125 MG tablet Take 3.125 mg by mouth 2 (two) times daily with a meal.    . levothyroxine (SYNTHROID, LEVOTHROID) 125 MCG tablet TAKE 1 TABLET BY MOUTH EVERY MORNING BEFORE BREAKFAST 30 tablet 11  . Multiple Vitamin (MULTIVITAMIN WITH MINERALS) TABS tablet Take 1 tablet by mouth daily.    . polyethylene glycol (MIRALAX / GLYCOLAX) packet Take 17 g by mouth daily.    Marland Kitchen PRESCRIPTION MEDICATION Inhale into the lungs at bedtime. CPAP    . rosuvastatin (CRESTOR) 10 MG tablet Take 10 mg by mouth at bedtime.    . selenium sulfide (SELSUN) 2.5 % shampoo     . senna-docusate (SENOKOT-S) 8.6-50 MG tablet Take 1 tablet by mouth 2 (two) times daily.    Marland Kitchen  sertraline (ZOLOFT) 50 MG tablet Take 2.5 tablets (125 mg total) by mouth daily. (Patient taking differently: Take 100 mg by mouth daily. )    . Turmeric Curcumin 500 MG CAPS Take 500 mg by mouth 2 (two) times daily.     No current facility-administered medications for this visit.     PHYSICAL EXAMINATION: ECOG PERFORMANCE STATUS: 3  Vitals:   11/24/15 1241  BP: 135/84  Pulse: 77  Resp: 18  Temp: 98 F (36.7 C)   Filed Weights   11/24/15 1241  Weight: 195 lb 8 oz (88.7 kg)    GENERAL:alert, no distress and comfortable SKIN: skin color, texture, turgor are normal, no rashes or significant lesions EYES: normal, Conjunctiva are pink and non-injected, sclera clear OROPHARYNX:no exudate, no erythema and lips, buccal mucosa, and tongue normal  NECK: supple, thyroid normal size, non-tender, without nodularity LYMPH:  no palpable lymphadenopathy in the cervical, [place], axillary or inguinal LUNGS: clear to auscultation and percussion with normal breathing effort HEART: regular rate & rhythm and no murmurs and no lower extremity edema ABDOMEN:abdomen soft, non-tender and normal bowel sounds Musculoskeletal:no cyanosis of digits and no clubbing  NEURO: alert & oriented x 3 with fluent speech, no focal motor/sensory deficits, he walks with a  cane independently, although his gait is slightly wobbly.   LABORATORY DATA:  I have reviewed the data as listed CBC Latest Ref Rng & Units 11/24/2015 11/10/2015 10/27/2015  WBC 4.0 - 10.3 10e3/uL 4.9 5.3 11.8(H)  Hemoglobin 13.0 - 17.1 g/dL 11.7(L) 12.8(L) 13.7  Hematocrit 38.4 - 49.9 % 35.5(L) 38.3(L) 40.5  Platelets 140 - 400 10e3/uL 312 168 289     CMP Latest Ref Rng & Units 11/24/2015 11/10/2015 10/27/2015  Glucose 70 - 140 mg/dl 78 87 118  BUN 7.0 - 26.0 mg/dL 29.0(H) 33.4(H) 61.4(H)  Creatinine 0.7 - 1.3 mg/dL 0.9 1.0 1.2  Sodium 136 - 145 mEq/L 139 139 133(L)  Potassium 3.5 - 5.1 mEq/L 5.1 4.9 5.2(H)  Chloride 101 - 111 mmol/L - - -  CO2 22 - 29 mEq/L 24 21(L) 17(L)  Calcium 8.4 - 10.4 mg/dL 9.9 9.8 8.9  Total Protein 6.4 - 8.3 g/dL 6.9 7.0 6.8  Total Bilirubin 0.20 - 1.20 mg/dL 0.36 0.44 0.56  Alkaline Phos 40 - 150 U/L 62 68 71  AST 5 - 34 U/L 22 23 17   ALT 0 - 55 U/L 18 21 20    PATHOLOGY REPORT: Diagnosis 09/21/2015 Lymph node, needle/core biopsy, hypermetabolic left sub clavicular LN - METASTATIC MALIGNANT MELANOMA. - SEE COMMENT. Microscopic Comment The case was discussed with Dr. Alen Blew on 09/22/15. Additional studies can be performed upon clinician request. (JBK:gt, 09/22/15)  Diagnosis 10/15/2015 Brain, for tumor resection, Left frontal - METASTATIC MELANOMA. Microscopic Comment The tumor is a high grade malignancy with frequent mitotic figures and focal intracytoplasmic pigment. Immunohistochemistry shows strong positivity with S-100, HMB45 and Melan-A. (JDP:ecj 10/18/2015)      RADIOGRAPHIC STUDIES: I have personally reviewed the radiological images as listed and agreed with the findings in the report. No results found.   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   1. Metastatic melanoma to lung, node, bone and  brain, BRAF V600K mutation (+) -I previously  reviewed his PET/CT scan images with pt and his family members. The scan revealed hypermetabolic left upper lobe lung mass, 2 small right lung nodules, probable bone metastasis, and  a subcutaneous left supraclavicular lesion, which is not palpable  -His repeat his CT had showed a 2.0 cm left frontal hyperdense lesion with enhancement on contrast, in the area of his recent hemorrhagic stroke, highly suspicious for brain metastasis.  -We discussed his left supraclavicular node biopsy results, which showed metastatic melanoma.. -His case was discussed in our sarcoma tumor   -I recommend a first-line treatment with Nivolumab alone, given his recent stroke, low tumor burden. I'll reserve BRAF inhibitor with or without MEK inhibitor as a second line therapy if he progress on immunotherapy. Other options of immunotherapy, especially checkpoint inhibitor with ipilumimab, were also discussed with pt and his daughter -He has been tolerating Nivolumab well, no significant side effects except fatigue We'll continue -He is now status post SBRT and surgical resection to his solitary brain metastasis on 10/15/15 -Based on the recent date reported in ASCO 2017 one month ago (abstract [id number] and [id number]), which showed significant and meaningful improvement of response rate and PFS with combination of nivo and ipi than nivo alone for metastatic melanoma with brain metastasis. So I recommend him to consider adding ipilimumab, every 3 weeks for 4 cycles. However due to his worsening fatigue, I'll hold on ipilumimab for now  -Lab results reviewed, adequate for treatment, we'll continue nivo  -plan to repeat PET in 3-4 weeks   2. Hemorrhagic stroke and brain metastasis  -I encouraged him to continue physical therapy and occupational therapy. He has recovered  very well -History post SBRT and surgical resection of his solitary brain metastasis   3. Profound fatigue -He has baseline  hypothyroidism and a hypotestosteronemia, and he has been off the testosterone injection since his cancer diagnosis -He was also on steroids for about a month, possible mild adrenal insufficiency -His fatigue could also be partially related to Nivolumab -His TSH was slightly elevated 2 weeks ago, today's results to pending, I'll add on free T3 and T4, and we'll check his testosterone level today. -I'll call him back with her lab results, to see if we need to see her endocrinologist or his primary care physician, to adjust his meds   Plan -We'll proceed with Nivolumab treatment today and continue every 2 weeks  -I'll call him back with his lab results -I'll see him back in 2 weeks for next treatment. I'll order a PET scan and x-rays visit.  All questions were answered. The patient knows to call the clinic with any problems, questions or concerns. No barriers to learning was detected.  I spent 30 minutes counseling the patient face to face. The total time spent in the appointment was 35 minutes and more than 50% was on counseling and review of test results     [name], MD 11/25/15   Addendum  Lab results returned:  Results for ADRIANA, LINA (MRN [id number]) as of 11/26/2015 17:04  Ref. Range 11/24/2015 12:08 11/24/2015 14:17  Sex Horm Binding Glob, Serum Latest Ref Range: 19.3 - 76.4 nmol/L  58.6  Testosterone Latest Ref Range: 264 - 916 ng/dL  99 (L)  Testosterone Free Latest Ref Range: 6.6 - 18.1 pg/mL  0.9 (L)  TSH Latest Ref Range: 0.320 - 4.118 m(IU)/L 0.129 (L)   T4,Free(Direct) Latest Ref Range: 0.82 - 1.77 ng/dL  2.12 (H)  Triiodothyronine,Free,Serum Latest Ref Range: 2.0 - 4.4 pg/mL  4.0   Results for NAKSH, RADI (MRN [id number]) as of 11/26/2015 17:04  Ref. Range 11/24/2015 12:08  Vitamin D, 25-Hydroxy Latest Ref Range: 30.0 - 100.0 ng/mL 28.0 (  L)   I called patient but could not get a hold of him. I called the patient's [name] and discussed the above test results.  I suggest him to see his primary care physician Dr. Asher Muir who manages his hypothyroidism and hypotestosteronemia, to restart testosterone injection and adjust his Synthroid, also replace his vitamin D. I'll copy my note to Dr Asher Muir and hope he will be seen next week.   [name]  11/26/2015 5:12 PM

## 2015-11-24 NOTE — Patient Instructions (Signed)
[place] Discharge Instructions for Patients Receiving Chemotherapy  Today you received the following chemotherapy agents:  Opdivo (Nivolumab)  To help prevent nausea and vomiting after your treatment, we encourage you to take your nausea medication as prescribed.   If you develop nausea and vomiting that is not controlled by your nausea medication, call the clinic.   BELOW ARE SYMPTOMS THAT SHOULD BE REPORTED IMMEDIATELY:  *FEVER GREATER THAN 100.5 F  *CHILLS WITH OR WITHOUT FEVER  NAUSEA AND VOMITING THAT IS NOT CONTROLLED WITH YOUR NAUSEA MEDICATION  *UNUSUAL SHORTNESS OF BREATH  *UNUSUAL BRUISING OR BLEEDING  TENDERNESS IN MOUTH AND THROAT WITH OR WITHOUT PRESENCE OF ULCERS  *URINARY PROBLEMS  *BOWEL PROBLEMS  UNUSUAL RASH Items with * indicate a potential emergency and should be followed up as soon as possible.  Feel free to call the clinic you have any questions or concerns. The clinic phone number is [phone].  Please show the [facility] at check-in to the Emergency Department and triage nurse.

## 2015-11-25 ENCOUNTER — Encounter: Payer: Self-pay | Admitting: Hematology

## 2015-11-25 ENCOUNTER — Other Ambulatory Visit: Payer: Self-pay | Admitting: Radiation Therapy

## 2015-11-25 DIAGNOSIS — C7931 Secondary malignant neoplasm of brain: Secondary | ICD-10-CM

## 2015-11-25 DIAGNOSIS — C7949 Secondary malignant neoplasm of other parts of nervous system: Principal | ICD-10-CM

## 2015-11-25 LAB — VITAMIN D 25 HYDROXY (VIT D DEFICIENCY, FRACTURES): Vitamin D, 25-Hydroxy: 28 ng/mL — ABNORMAL LOW (ref 30.0–100.0)

## 2015-11-25 LAB — TESTOSTERONE, FREE, TOTAL, SHBG
Free Testosterone(Direct): 0.9 pg/mL — ABNORMAL LOW (ref 6.6–18.1)
SEX HORMONE BINDING: 58.6 nmol/L (ref 19.3–76.4)
Testosterone, Serum: 99 ng/dL — ABNORMAL LOW (ref 264–916)

## 2015-11-25 LAB — T4, FREE: T4,Free(Direct): 2.12 ng/dL — ABNORMAL HIGH (ref 0.82–1.77)

## 2015-11-25 LAB — T3, FREE: Triiodothyronine,Free,Serum: 4 pg/mL (ref 2.0–4.4)

## 2015-11-29 ENCOUNTER — Telehealth: Payer: Self-pay | Admitting: Hematology

## 2015-11-29 NOTE — Telephone Encounter (Signed)
Appt conf with patient. 11/29/15

## 2015-11-30 ENCOUNTER — Ambulatory Visit: Payer: Medicare Other | Admitting: Internal Medicine

## 2015-12-01 ENCOUNTER — Ambulatory Visit: Payer: Medicare Other | Admitting: Hematology

## 2015-12-01 ENCOUNTER — Other Ambulatory Visit: Payer: Medicare Other

## 2015-12-01 ENCOUNTER — Ambulatory Visit: Payer: Medicare Other

## 2015-12-08 ENCOUNTER — Other Ambulatory Visit (HOSPITAL_BASED_OUTPATIENT_CLINIC_OR_DEPARTMENT_OTHER): Payer: Medicare Other

## 2015-12-08 ENCOUNTER — Telehealth: Payer: Self-pay | Admitting: Hematology

## 2015-12-08 ENCOUNTER — Ambulatory Visit (HOSPITAL_BASED_OUTPATIENT_CLINIC_OR_DEPARTMENT_OTHER): Payer: Medicare Other

## 2015-12-08 ENCOUNTER — Ambulatory Visit (HOSPITAL_BASED_OUTPATIENT_CLINIC_OR_DEPARTMENT_OTHER): Payer: Medicare Other | Admitting: Hematology

## 2015-12-08 VITALS — BP 134/78 | HR 77 | Temp 97.5°F | Resp 18 | Ht 69.0 in | Wt 197.5 lb

## 2015-12-08 DIAGNOSIS — E039 Hypothyroidism, unspecified: Secondary | ICD-10-CM

## 2015-12-08 DIAGNOSIS — C799 Secondary malignant neoplasm of unspecified site: Secondary | ICD-10-CM

## 2015-12-08 DIAGNOSIS — R5383 Other fatigue: Secondary | ICD-10-CM

## 2015-12-08 DIAGNOSIS — C7931 Secondary malignant neoplasm of brain: Secondary | ICD-10-CM

## 2015-12-08 DIAGNOSIS — C7951 Secondary malignant neoplasm of bone: Secondary | ICD-10-CM

## 2015-12-08 DIAGNOSIS — C439 Malignant melanoma of skin, unspecified: Secondary | ICD-10-CM

## 2015-12-08 DIAGNOSIS — Z8673 Personal history of transient ischemic attack (TIA), and cerebral infarction without residual deficits: Secondary | ICD-10-CM | POA: Diagnosis not present

## 2015-12-08 DIAGNOSIS — C7800 Secondary malignant neoplasm of unspecified lung: Secondary | ICD-10-CM | POA: Diagnosis not present

## 2015-12-08 DIAGNOSIS — C770 Secondary and unspecified malignant neoplasm of lymph nodes of head, face and neck: Secondary | ICD-10-CM

## 2015-12-08 DIAGNOSIS — I619 Nontraumatic intracerebral hemorrhage, unspecified: Secondary | ICD-10-CM

## 2015-12-08 DIAGNOSIS — Z5112 Encounter for antineoplastic immunotherapy: Secondary | ICD-10-CM | POA: Diagnosis present

## 2015-12-08 LAB — CBC WITH DIFFERENTIAL/PLATELET
BASO%: 0.5 % (ref 0.0–2.0)
BASOS ABS: 0 10*3/uL (ref 0.0–0.1)
EOS ABS: 0.2 10*3/uL (ref 0.0–0.5)
EOS%: 5.7 % (ref 0.0–7.0)
HEMATOCRIT: 35.2 % — AB (ref 38.4–49.9)
HEMOGLOBIN: 11.7 g/dL — AB (ref 13.0–17.1)
LYMPH#: 0.3 10*3/uL — AB (ref 0.9–3.3)
LYMPH%: 8.4 % — ABNORMAL LOW (ref 14.0–49.0)
MCH: 30 pg (ref 27.2–33.4)
MCHC: 33.3 g/dL (ref 32.0–36.0)
MCV: 90.2 fL (ref 79.3–98.0)
MONO#: 0.4 10*3/uL (ref 0.1–0.9)
MONO%: 10.6 % (ref 0.0–14.0)
NEUT#: 2.9 10*3/uL (ref 1.5–6.5)
NEUT%: 74.8 % (ref 39.0–75.0)
PLATELETS: 174 10*3/uL (ref 140–400)
RBC: 3.9 10*6/uL — ABNORMAL LOW (ref 4.20–5.82)
RDW: 16.5 % — AB (ref 11.0–14.6)
WBC: 3.9 10*3/uL — ABNORMAL LOW (ref 4.0–10.3)

## 2015-12-08 LAB — COMPREHENSIVE METABOLIC PANEL
ALBUMIN: 3.2 g/dL — AB (ref 3.5–5.0)
ALK PHOS: 64 U/L (ref 40–150)
ALT: 16 U/L (ref 0–55)
AST: 20 U/L (ref 5–34)
Anion Gap: 9 mEq/L (ref 3–11)
BILIRUBIN TOTAL: 0.38 mg/dL (ref 0.20–1.20)
BUN: 23.3 mg/dL (ref 7.0–26.0)
CALCIUM: 9.4 mg/dL (ref 8.4–10.4)
CO2: 21 mEq/L — ABNORMAL LOW (ref 22–29)
Chloride: 109 mEq/L (ref 98–109)
Creatinine: 1 mg/dL (ref 0.7–1.3)
EGFR: 72 mL/min/{1.73_m2} — AB (ref >90)
Glucose: 133 mg/dl (ref 70–140)
POTASSIUM: 4.5 meq/L (ref 3.5–5.1)
Sodium: 140 mEq/L (ref 136–145)
TOTAL PROTEIN: 6.5 g/dL (ref 6.4–8.3)

## 2015-12-08 LAB — TSH: TSH: <0.08 m(IU)/L — ABNORMAL LOW (ref 0.320–4.118)

## 2015-12-08 MED ORDER — SODIUM CHLORIDE 0.9 % IV SOLN
Freq: Once | INTRAVENOUS | Status: AC
  Administered 2015-12-08: 12:00:00 via INTRAVENOUS

## 2015-12-08 MED ORDER — SODIUM CHLORIDE 0.9 % IV SOLN
240.0000 mg | Freq: Once | INTRAVENOUS | Status: AC
  Administered 2015-12-08: 240 mg via INTRAVENOUS
  Filled 2015-12-08: qty 20

## 2015-12-08 NOTE — Progress Notes (Signed)
David [name]  [phone] [phone]  Clinic Follow up Note   Patient Care Team: [name], MD as PCP - General (Internal Medicine) 12/08/2015  Chief complaint: Follow-up metastatic melanoma  Oncology History   Metastatic melanoma [facility])   Staging form: Melanoma of the Skin, AJCC 7th Edition     Clinical stage from 09/21/2015: Stage IV [place], [place], [place]) - Signed by [name], MD on 10/09/2015       Metastatic melanoma ([place])   09/16/2015 Imaging    PET scan showed a hypermetabolic 5.6FB lingular nodule, and a 29m right upper lobe lung nodule, left subclavicular nodes, and bone metastasis in the proximal right humerus and right femur.       09/21/2015 Initial Diagnosis    [place]astatic [place]      09/21/2015 Initial Biopsy    Left subclavicular lymph node biopsy showed prostatic of melanoma      09/21/2015 Miscellaneous    BRAF V600K mutation (+)       10/04/2015 Imaging    CT head with without contrast showed a 2.0 x 1.6 x 1.7 cm superficial left frontal hyperdense lesion, with postcontrast enhancement, lying within the previous hemorrhagic area, concerning for solitary melanoma metastasis      10/06/2015 -  Chemotherapy    Nivolumab 2439mevery 2 weeks       10/13/2015 - 10/13/2015 Radiation Therapy    SRS to his left frontal solitary brain metastasis       10/15/2015 Surgery    left front craniotomy and tumor excision       10/15/2015 Pathology Results    left frontal brain mass excision showed metastatic melanoma, 1.5X1.5X0.6cm       History of present illness (09/02/2015): Pt is a [age].o. Retired dePharmacist, communityith history of HTN, OSA, CKD, PPM who was admitted on 08/22/15 with expressive difficulty. He was found to have left frontal hemorrhage 5.7 cm x 4.5 cm with 6 mm left to right shift.During the workup for his stroke, a left upper lung mass was noticed on the CT scan. I was called to evaluate his lung mass.  Patient has aphasia,  not able to communicate much. According to his daughter, Dr. LeGala Romneywho is a OB GYN at [facility] probably only smoked for very short period time during the college. He did have 3 skin melanoma, which was removed, last one was 2-3 years ago. No other cancer history.  CURRENT THERAPY: Nivolumab 240 mg, every 2 weeks, started on 10/06/2015  INTERVAL HISTORY: Mr. HaBabyeturns for follow-up and nivolumab therapy. He presents to the clinic by himself today. He has restarted testosterone last week and has felt much better overall. His energy level is much improved, he is able to tolerate more activities, no complains of pain, dyspnea or other symptoms. He has gradually gained some weight back.   REVIEW OF SYSTEMS:  Constitutional: Denies fevers, chills or abnormal weight loss, (+) mild fatigue Eyes: Denies blurriness of vision Ears, nose, mouth, throat, and face: Denies mucositis or sore throat Respiratory: Denies cough, dyspnea or wheezes Cardiovascular: Denies palpitation, chest discomfort or lower extremity swelling Gastrointestinal:  Denies nausea, heartburn or change in bowel habits Skin: Denies abnormal skin rashes Lymphatics: Denies new lymphadenopathy or easy bruising Neurological:see HPI  Behavioral/Psych: Mood is stable, no new changes  All other systems were reviewed with the patient and are negative.  MEDICAL HISTORY:  Past Medical History:  Diagnosis Date  . Anginal pain ([place]  .  Arthritis    mostly hands  . Benign familial hematuria   . BPH (benign prostatic hypertrophy)   . Brain cancer ([place])    melanoma with brain met  . Coronary artery disease   . GERD (gastroesophageal reflux disease)   . High cholesterol   . Hypertension   . Hypothyroidism   . Intracerebral hemorrhage ([place])   . Melanoma ([place])   . Metastatic melanoma to lung ([place])   . Mood disorder ([place])   . Myocardial infarction ([place])   . Pacemaker    due to syncope, 3rd degree HB (upgrade to [facility].  Jude CRT-P 02/25/13 (Dr. Uvaldo Rising)  . Rectal bleed    due to NSAIDS  . Renal disorder   . Skin cancer    melanoma  . Sleep apnea    uses CPAP  . Stroke [facility])     SURGICAL HISTORY: Past Surgical History:  Procedure Laterality Date  . APPLICATION OF CRANIAL NAVIGATION N/A 10/15/2015   Procedure: APPLICATION OF CRANIAL NAVIGATION;  Surgeon: Erline Levine, MD;  Location: [place] NEURO ORS;  Service: Neurosurgery;  Laterality: N/A;  . CARDIAC CATHETERIZATION  2013  . CARDIAC SURGERY     bypass X 2  . CORONARY ARTERY BYPASS GRAFT  2013   LIMA-LAD, SVG-PDA 03/27/11 (Dr. Francee Gentile)  . CRANIOTOMY N/A 10/15/2015   Procedure: LEFT FRONTAL CRANIOTOMY TUMOR EXCISION with Curve;  Surgeon: Erline Levine, MD;  Location: [place] NEURO ORS;  Service: Neurosurgery;  Laterality: N/A;  CRANIOTOMY TUMOR EXCISION  . EYE SURGERY    . FOOT SURGERY  08/2010  . JOINT REPLACEMENT Left    partial knee  . KNEE ARTHROSCOPY  12/2008  . LYMPH NODE BIOPSY    . PACEMAKER INSERTION    . TONSILLECTOMY      I have reviewed the social history and family history with the patient and they are unchanged from previous note.  ALLERGIES:  is allergic to zocor [simvastatin].  MEDICATIONS:  Current Outpatient Prescriptions  Medication Sig Dispense Refill  . acetaminophen (TYLENOL) 325 MG tablet Take 2 tablets (650 mg total) by mouth every 6 (six) hours as needed for mild pain. (Patient taking differently: Take 650 mg by mouth 3 (three) times daily. )    . alfuzosin (UROXATRAL) 10 MG 24 hr tablet TAKE 1 TABLET BY MOUTH EVERY DAY AT BREAKFAST 30 tablet 0  . carvedilol (COREG) 3.125 MG tablet Take 3.125 mg by mouth 2 (two) times daily with a meal.    . levothyroxine (SYNTHROID, LEVOTHROID) 125 MCG tablet TAKE 1 TABLET BY MOUTH EVERY MORNING BEFORE BREAKFAST 30 tablet 11  . Multiple Vitamin (MULTIVITAMIN WITH MINERALS) TABS tablet Take 1 tablet by mouth daily.    . polyethylene glycol (MIRALAX / GLYCOLAX) packet Take 17 g by mouth daily.    Marland Kitchen  PRESCRIPTION MEDICATION Inhale into the lungs at bedtime. CPAP    . rosuvastatin (CRESTOR) 10 MG tablet Take 10 mg by mouth at bedtime.    . selenium sulfide (SELSUN) 2.5 % shampoo     . senna-docusate (SENOKOT-S) 8.6-50 MG tablet Take 1 tablet by mouth 2 (two) times daily.    . sertraline (ZOLOFT) 50 MG tablet Take 2.5 tablets (125 mg total) by mouth daily. (Patient taking differently: Take 100 mg by mouth daily. )    . Turmeric Curcumin 500 MG CAPS Take 500 mg by mouth 2 (two) times daily.     No current facility-administered medications for this visit.     PHYSICAL EXAMINATION: ECOG PERFORMANCE STATUS:  2  Vitals:   12/08/15 1119  BP: 134/78  Pulse: 77  Resp: 18  Temp: 97.5 F (36.4 C)   Filed Weights   12/08/15 1119  Weight: 197 lb 8 oz (89.6 kg)    GENERAL:alert, no distress and comfortable SKIN: skin color, texture, turgor are normal, no rashes or significant lesions EYES: normal, Conjunctiva are pink and non-injected, sclera clear OROPHARYNX:no exudate, no erythema and lips, buccal mucosa, and tongue normal  NECK: supple, thyroid normal size, non-tender, without nodularity LYMPH:  no palpable lymphadenopathy in the cervical, [place], axillary or inguinal LUNGS: clear to auscultation and percussion with normal breathing effort HEART: regular rate & rhythm and no murmurs and no lower extremity edema ABDOMEN:abdomen soft, non-tender and normal bowel sounds Musculoskeletal:no cyanosis of digits and no clubbing  NEURO: alert & oriented x 3 with fluent speech, no focal motor/sensory deficits, he walks with a cane independently, although his gait is slightly wobbly.   LABORATORY DATA:  I have reviewed the data as listed CBC Latest Ref Rng & Units 12/08/2015 11/24/2015 11/10/2015  WBC 4.0 - 10.3 10e3/uL 3.9(L) 4.9 5.3  Hemoglobin 13.0 - 17.1 g/dL 11.7(L) 11.7(L) 12.8(L)  Hematocrit 38.4 - 49.9 % 35.2(L) 35.5(L) 38.3(L)  Platelets 140 - 400 10e3/uL 174 312 168     CMP Latest Ref  Rng & Units 12/08/2015 11/24/2015 11/10/2015  Glucose 70 - 140 mg/dl 133 78 87  BUN 7.0 - 26.0 mg/dL 23.3 29.0(H) 33.4(H)  Creatinine 0.7 - 1.3 mg/dL 1.0 0.9 1.0  Sodium 136 - 145 mEq/L 140 139 139  Potassium 3.5 - 5.1 mEq/L 4.5 5.1 4.9  Chloride 101 - 111 mmol/L - - -  CO2 22 - 29 mEq/L 21(L) 24 21(L)  Calcium 8.4 - 10.4 mg/dL 9.4 9.9 9.8  Total Protein 6.4 - 8.3 g/dL 6.5 6.9 7.0  Total Bilirubin 0.20 - 1.20 mg/dL 0.38 0.36 0.44  Alkaline Phos 40 - 150 U/L 64 62 68  AST 5 - 34 U/L _0 ALT 0 - 55 U/L _1 Results for LOCHLIN, EPPINGER (MRN [id number]) as of 12/11/2015 21:00  Ref. Range 10/27/2015 08:55 11/10/2015 10:44 11/24/2015 12:08 11/24/2015 14:17 12/08/2015 10:25  TSH Latest Ref Range: 0.320 - 4.118 m(IU)/L 5.165 (H) 5.307 (H) 0.129 (L)  <0.080 (L)  T4,Free(Direct) Latest Ref Range: 0.82 - 1.77 ng/dL    2.12 (H)   Triiodothyronine,Free,Serum Latest Ref Range: 2.0 - 4.4 pg/mL    4.0     PATHOLOGY REPORT: Diagnosis 09/21/2015 Lymph node, needle/core biopsy, hypermetabolic left sub clavicular LN - METASTATIC MALIGNANT MELANOMA. - SEE COMMENT. Microscopic Comment The case was discussed with Dr. Alen Blew on 09/22/15. Additional studies can be performed upon clinician request. (JBK:gt, 09/22/15)  Diagnosis 10/15/2015 Brain, for tumor resection, Left frontal - METASTATIC MELANOMA. Microscopic Comment The tumor is a high grade malignancy with frequent mitotic figures and focal intracytoplasmic pigment. Immunohistochemistry shows strong positivity with S-100, HMB45 and Melan-A. (JDP:ecj 10/18/2015)      RADIOGRAPHIC STUDIES: I have personally reviewed the radiological images as listed and agreed with the findings in the report. No results found.   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   1. Metastatic melanoma to lung, node, bone and  brain, BRAF V600K mutation (+) -I previously  reviewed his PET/CT scan images with pt and his family members. The scan revealed hypermetabolic left upper lobe  lung mass, 2 small right lung nodules, probable bone metastasis, and a subcutaneous left supraclavicular lesion, which is not palpable  -His repeat his CT had showed a 2.0 cm left frontal hyperdense lesion with enhancement on contrast, in the area of his recent hemorrhagic stroke, highly suspicious for brain metastasis.  -We discussed his left supraclavicular node biopsy results, which showed metastatic melanoma.. -His case was discussed in our sarcoma tumor   -I recommend a first-line treatment with Nivolumab alone, given his recent stroke, low tumor burden. I'll reserve BRAF inhibitor with or without MEK inhibitor as a second line therapy if he progress on immunotherapy. Other options of immunotherapy, especially checkpoint inhibitor with ipilumimab, were also discussed with pt and his daughter -He has been tolerating Nivolumab well, no significant side effects except mild fatigue We'll continue -He is now status post SBRT and surgical resection to his solitary brain metastasis on 10/15/15  -Lab results reviewed, adequate for treatment, we'll continue nivo  -plan to repeat PET in 3-4 weeks   2. Hemorrhagic stroke and brain metastasis  -I encouraged him to continue physical therapy and occupational therapy. He has recovered  very well -History post SBRT and surgical resection of his solitary brain metastasis   3. Fatigue -He has baseline hypothyroidism and a hypotestosteronemia, and he has been off the testosterone injection since his cancer diagnosis -his fatigue has much improved after he restarted testosterone injection  4. Hypothyroidism  -will continue monitoring his TSH, his PCP recommends him to continue current synthroid dose  -his TSH has significantly decreased today, will contact his PCP   Plan -We'll proceed with Nivolumab  treatment today and continue every 2 weeks  -I'll add free T3, T4 level and copy his TSH result (low) to his PCP, to see if he needs to cut his synthroid dose  -I'll see him back in 2 weeks for next treatment, restaging PET in 3 weeks   All questions were answered. The patient knows to call the clinic with any problems, questions or concerns. No barriers to learning was detected.  I spent 20 minutes counseling the patient face to face. The total time spent in the appointment was 30 minutes and more than 50% was on counseling and review of test results     [name], MD 12/08/15

## 2015-12-08 NOTE — Patient Instructions (Signed)
[place] Discharge Instructions for Patients Receiving Chemotherapy  Today you received the following chemotherapy agents:  Opdivo (Nivolumab)  To help prevent nausea and vomiting after your treatment, we encourage you to take your nausea medication as prescribed.   If you develop nausea and vomiting that is not controlled by your nausea medication, call the clinic.   BELOW ARE SYMPTOMS THAT SHOULD BE REPORTED IMMEDIATELY:  *FEVER GREATER THAN 100.5 F  *CHILLS WITH OR WITHOUT FEVER  NAUSEA AND VOMITING THAT IS NOT CONTROLLED WITH YOUR NAUSEA MEDICATION  *UNUSUAL SHORTNESS OF BREATH  *UNUSUAL BRUISING OR BLEEDING  TENDERNESS IN MOUTH AND THROAT WITH OR WITHOUT PRESENCE OF ULCERS  *URINARY PROBLEMS  *BOWEL PROBLEMS  UNUSUAL RASH Items with * indicate a potential emergency and should be followed up as soon as possible.  Feel free to call the clinic you have any questions or concerns. The clinic phone number is [phone].  Please show the [place] at check-in to the Emergency Department and triage nurse.

## 2015-12-08 NOTE — Telephone Encounter (Signed)
GAVE PATIENT AVS REPORT AND APPOINTMENTS FOR September.  °

## 2015-12-11 ENCOUNTER — Encounter: Payer: Self-pay | Admitting: Hematology

## 2015-12-22 ENCOUNTER — Other Ambulatory Visit (HOSPITAL_BASED_OUTPATIENT_CLINIC_OR_DEPARTMENT_OTHER): Payer: Medicare Other

## 2015-12-22 ENCOUNTER — Ambulatory Visit (HOSPITAL_BASED_OUTPATIENT_CLINIC_OR_DEPARTMENT_OTHER): Payer: Medicare Other

## 2015-12-22 ENCOUNTER — Encounter: Payer: Self-pay | Admitting: Physical Medicine & Rehabilitation

## 2015-12-22 ENCOUNTER — Encounter: Payer: Self-pay | Admitting: Hematology

## 2015-12-22 ENCOUNTER — Encounter: Payer: Medicare Other | Attending: Physical Medicine & Rehabilitation | Admitting: Physical Medicine & Rehabilitation

## 2015-12-22 ENCOUNTER — Ambulatory Visit (HOSPITAL_BASED_OUTPATIENT_CLINIC_OR_DEPARTMENT_OTHER): Payer: Medicare Other | Admitting: Hematology

## 2015-12-22 VITALS — BP 146/105 | HR 77 | Resp 17

## 2015-12-22 VITALS — BP 135/85 | HR 75 | Temp 98.0°F | Resp 17 | Ht 69.0 in | Wt 198.5 lb

## 2015-12-22 DIAGNOSIS — C799 Secondary malignant neoplasm of unspecified site: Secondary | ICD-10-CM

## 2015-12-22 DIAGNOSIS — N189 Chronic kidney disease, unspecified: Secondary | ICD-10-CM | POA: Insufficient documentation

## 2015-12-22 DIAGNOSIS — E7800 Pure hypercholesterolemia, unspecified: Secondary | ICD-10-CM | POA: Insufficient documentation

## 2015-12-22 DIAGNOSIS — G4733 Obstructive sleep apnea (adult) (pediatric): Secondary | ICD-10-CM | POA: Diagnosis not present

## 2015-12-22 DIAGNOSIS — C439 Malignant melanoma of skin, unspecified: Secondary | ICD-10-CM

## 2015-12-22 DIAGNOSIS — Z8673 Personal history of transient ischemic attack (TIA), and cerebral infarction without residual deficits: Secondary | ICD-10-CM | POA: Diagnosis not present

## 2015-12-22 DIAGNOSIS — R5383 Other fatigue: Secondary | ICD-10-CM | POA: Diagnosis not present

## 2015-12-22 DIAGNOSIS — C7931 Secondary malignant neoplasm of brain: Secondary | ICD-10-CM | POA: Diagnosis not present

## 2015-12-22 DIAGNOSIS — I129 Hypertensive chronic kidney disease with stage 1 through stage 4 chronic kidney disease, or unspecified chronic kidney disease: Secondary | ICD-10-CM | POA: Diagnosis not present

## 2015-12-22 DIAGNOSIS — R269 Unspecified abnormalities of gait and mobility: Secondary | ICD-10-CM

## 2015-12-22 DIAGNOSIS — Z87891 Personal history of nicotine dependence: Secondary | ICD-10-CM | POA: Diagnosis not present

## 2015-12-22 DIAGNOSIS — R338 Other retention of urine: Secondary | ICD-10-CM | POA: Insufficient documentation

## 2015-12-22 DIAGNOSIS — I6990 Unspecified sequelae of unspecified cerebrovascular disease: Secondary | ICD-10-CM

## 2015-12-22 DIAGNOSIS — Z5189 Encounter for other specified aftercare: Secondary | ICD-10-CM | POA: Diagnosis not present

## 2015-12-22 DIAGNOSIS — C770 Secondary and unspecified malignant neoplasm of lymph nodes of head, face and neck: Secondary | ICD-10-CM | POA: Diagnosis not present

## 2015-12-22 DIAGNOSIS — C7800 Secondary malignant neoplasm of unspecified lung: Secondary | ICD-10-CM | POA: Diagnosis not present

## 2015-12-22 DIAGNOSIS — I69120 Aphasia following nontraumatic intracerebral hemorrhage: Secondary | ICD-10-CM | POA: Diagnosis not present

## 2015-12-22 DIAGNOSIS — I252 Old myocardial infarction: Secondary | ICD-10-CM | POA: Insufficient documentation

## 2015-12-22 DIAGNOSIS — E039 Hypothyroidism, unspecified: Secondary | ICD-10-CM

## 2015-12-22 DIAGNOSIS — I69151 Hemiplegia and hemiparesis following nontraumatic intracerebral hemorrhage affecting right dominant side: Secondary | ICD-10-CM | POA: Insufficient documentation

## 2015-12-22 DIAGNOSIS — Z5112 Encounter for antineoplastic immunotherapy: Secondary | ICD-10-CM | POA: Diagnosis present

## 2015-12-22 DIAGNOSIS — I69219 Unspecified symptoms and signs involving cognitive functions following other nontraumatic intracranial hemorrhage: Secondary | ICD-10-CM | POA: Insufficient documentation

## 2015-12-22 DIAGNOSIS — C7951 Secondary malignant neoplasm of bone: Secondary | ICD-10-CM

## 2015-12-22 DIAGNOSIS — I619 Nontraumatic intracerebral hemorrhage, unspecified: Secondary | ICD-10-CM

## 2015-12-22 DIAGNOSIS — N401 Enlarged prostate with lower urinary tract symptoms: Secondary | ICD-10-CM | POA: Insufficient documentation

## 2015-12-22 LAB — CBC WITH DIFFERENTIAL/PLATELET
BASO%: 0.7 % (ref 0.0–2.0)
BASOS ABS: 0 10*3/uL (ref 0.0–0.1)
EOS ABS: 0.3 10*3/uL (ref 0.0–0.5)
EOS%: 6.7 % (ref 0.0–7.0)
HEMATOCRIT: 38.6 % (ref 38.4–49.9)
HEMOGLOBIN: 12.8 g/dL — AB (ref 13.0–17.1)
LYMPH#: 0.4 10*3/uL — AB (ref 0.9–3.3)
LYMPH%: 9.9 % — ABNORMAL LOW (ref 14.0–49.0)
MCH: 29.9 pg (ref 27.2–33.4)
MCHC: 33.1 g/dL (ref 32.0–36.0)
MCV: 90.3 fL (ref 79.3–98.0)
MONO#: 0.4 10*3/uL (ref 0.1–0.9)
MONO%: 8.9 % (ref 0.0–14.0)
NEUT%: 73.8 % (ref 39.0–75.0)
NEUTROS ABS: 3 10*3/uL (ref 1.5–6.5)
PLATELETS: 162 10*3/uL (ref 140–400)
RBC: 4.27 10*6/uL (ref 4.20–5.82)
RDW: 16.3 % — AB (ref 11.0–14.6)
WBC: 4 10*3/uL (ref 4.0–10.3)

## 2015-12-22 LAB — COMPREHENSIVE METABOLIC PANEL
ALBUMIN: 3.3 g/dL — AB (ref 3.5–5.0)
ALK PHOS: 63 U/L (ref 40–150)
ALT: 15 U/L (ref 0–55)
AST: 22 U/L (ref 5–34)
Anion Gap: 9 mEq/L (ref 3–11)
BILIRUBIN TOTAL: 0.44 mg/dL (ref 0.20–1.20)
BUN: 22 mg/dL (ref 7.0–26.0)
CALCIUM: 9.3 mg/dL (ref 8.4–10.4)
CO2: 22 mEq/L (ref 22–29)
Chloride: 109 mEq/L (ref 98–109)
Creatinine: 1.2 mg/dL (ref 0.7–1.3)
EGFR: 58 mL/min/{1.73_m2} — AB (ref >90)
GLUCOSE: 129 mg/dL (ref 70–140)
Potassium: 4.2 mEq/L (ref 3.5–5.1)
SODIUM: 140 meq/L (ref 136–145)
TOTAL PROTEIN: 6.7 g/dL (ref 6.4–8.3)

## 2015-12-22 LAB — TSH: TSH: 0.278 m[IU]/L — AB (ref 0.320–4.118)

## 2015-12-22 MED ORDER — SODIUM CHLORIDE 0.9 % IV SOLN
Freq: Once | INTRAVENOUS | Status: AC
  Administered 2015-12-22: 10:00:00 via INTRAVENOUS

## 2015-12-22 MED ORDER — SODIUM CHLORIDE 0.9 % IV SOLN
240.0000 mg | Freq: Once | INTRAVENOUS | Status: AC
  Administered 2015-12-22: 240 mg via INTRAVENOUS
  Filled 2015-12-22: qty 20

## 2015-12-22 NOTE — Progress Notes (Signed)
Subjective:    Patient ID: David [name], male    DOB: [birth year], 75 y.o.   MRN: [id number]  HPI 75 y.o. male with history of HTN, OSA, CKD, PPM presents for follow up.  Pt was discharged from the hospital after have left frontal hemorrhage. He had deficits with hemiparesis, aphasia, apraxia, and cognition.    Last clinic visit 09/23/15.  Since last visit he had cranial melanoma removed.  He has started driving again.  "I believe in life, you take chances" and notes no one told him not to drive.  He has an appointment to see Neurology in 1 week.  He had his labs drawn.  He is seeing Hematology/Onc for lung mass, which he states is melanoma.  No problems with urination.  His left ankle has improved 95%.  Completed therapies.  Denies falls.   Pain Inventory Average Pain 2 Pain Right Now 2 My pain is aching  In the last 24 hours, has pain interfered with the following? General activity 2 Relation with others 2 Enjoyment of life 2 What TIME of day is your pain at its worst? daytime Sleep (in general) Good  Pain is worse with: walking and standing Pain improves with: rest Relief from Meds: 3  Mobility how many minutes can you walk? 1 hr ability to climb steps?  yes do you drive?  yes  Function retired  Neuro/Psych No problems in this area  Prior Studies Any changes since last visit?  no  Physicians involved in your care Any changes since last visit?  no   Family History  Problem Relation Age of Onset  . Cancer Mother 9    lung   . Hypertension Mother   . Hypertension Father   . Heart attack Father   . Heart disease Father   . Rheum arthritis Sister    Social History   Social History  . Marital status: Married    Spouse name: N/A  . Number of children: 2  . Years of education: N/A   Occupational History  . retired Pharmacist, community    Social History Main Topics  . Smoking status: Former Smoker    Packs/day: 1.00    Years: 3.00    Quit date: 04/11/1919  .  Smokeless tobacco: Never Used  . Alcohol use 2.4 oz/week    4 Glasses of wine per week     Comment: daily for the past 10 years - last few weeks none  . Drug use: No  . Sexual activity: No   Other Topics Concern  . None   Social History Narrative   Wife has dementia   Daughter [name] and son      Has living will   [name] is health care POA   Requests DNR-- done 09/14/15   Not sure about tube feeds   Past Surgical History:  Procedure Laterality Date  . APPLICATION OF CRANIAL NAVIGATION N/A 10/15/2015   Procedure: APPLICATION OF CRANIAL NAVIGATION;  Surgeon: Erline Levine, MD;  Location: [place] NEURO ORS;  Service: Neurosurgery;  Laterality: N/A;  . CARDIAC CATHETERIZATION  2013  . CARDIAC SURGERY     bypass X 2  . CORONARY ARTERY BYPASS GRAFT  2013   LIMA-LAD, SVG-PDA 03/27/11 (Dr. Francee Gentile)  . CRANIOTOMY N/A 10/15/2015   Procedure: LEFT FRONTAL CRANIOTOMY TUMOR EXCISION with Curve;  Surgeon: Erline Levine, MD;  Location: [place] NEURO ORS;  Service: Neurosurgery;  Laterality: N/A;  CRANIOTOMY TUMOR EXCISION  . EYE SURGERY    .  FOOT SURGERY  08/2010  . JOINT REPLACEMENT Left    partial knee  . KNEE ARTHROSCOPY  12/2008  . LYMPH NODE BIOPSY    . PACEMAKER INSERTION    . TONSILLECTOMY     Past Medical History:  Diagnosis Date  . Anginal pain ([place])   . Arthritis    mostly hands  . Benign familial hematuria   . BPH (benign prostatic hypertrophy)   . Brain cancer ([place])    melanoma with brain met  . Coronary artery disease   . GERD (gastroesophageal reflux disease)   . High cholesterol   . Hypertension   . Hypothyroidism   . Intracerebral hemorrhage ([place])   . Melanoma ([place])   . Metastatic melanoma to lung ([place])   . Mood disorder ([place])   . Myocardial infarction ([place])   . Pacemaker    due to syncope, 3rd degree HB (upgrade to [facility]. Jude CRT-P 02/25/13 (Dr. Uvaldo Rising)  . Rectal bleed    due to NSAIDS  . Renal disorder   . Skin cancer    melanoma  . Sleep apnea    uses  CPAP  . Stroke ([place])    BP (!) 146/105   Pulse 77   Resp 17   SpO2 97%   Opioid Risk Score:   Fall Risk Score:  `1  Depression screen PHQ 2/9  Depression screen PHQ 2/9 09/23/2015  Decreased Interest 0  Down, Depressed, Hopeless 0  PHQ - 2 Score 0  Altered sleeping 0  Tired, decreased energy 0  Change in appetite 0  Feeling bad or failure about yourself  0  Trouble concentrating 0  Moving slowly or fidgety/restless 0  Suicidal thoughts 0  PHQ-9 Score 0    Review of Systems  Eyes: Negative for visual disturbance.  Musculoskeletal: Negative for arthralgias, back pain and neck pain.  Neurological: Negative for dizziness, numbness and headaches.  All other systems reviewed and are negative.     Objective:   Physical Exam Gen. no acute distress. Vital signs reviewed. Well-developed, well-nourished. HENT: Normocephalic atraumatic Eyes: Conjunctivae and EOM normal Cardio: RRR and No murmur Resp: CTA B/L and unlabored GI: BS positive and nondistended nontender Musc/Skel: No edema. No tenderness Gait: Mild right lean.  Neuro:   Very mild expressive aphasia significantly improved.  Alert and oriented x3 Motor: 5/5 in the right deltoid, biceps, triceps, grip, hip flexor, knee extensor, ankle dorsiflex and plantar flexor.   5/5 on the left side in the same muscle groups. Sensation intact to light touch No ataxia. Skin:   Intact. Warm and dry.    Assessment & Plan:  75 y.o. male with history of HTN, OSA, CKD, PPM presents for follow up.  Pt was discharged from the hospital after have left frontal hemorrhage.   1. Left frontal intracranial hypertensive hemorrhage  Sequela of right hemiparesis, aphasia, apraxia, and cognitive deficits appears to have nearly resolved with minimal residual expressive deficits  Cont HEP  Follow up with Neurology, Dr. Erlinda Hong.    2. Melanoma with mets  Cont follow up with Heme/Onc  Cont infusion  3. Abnormality of gait  Denies falls  Mild  right lean, but states does not need anything at present

## 2015-12-22 NOTE — Patient Instructions (Signed)
[facility] Discharge Instructions for Patients Receiving Chemotherapy  Today you received the following chemotherapy agents:  Nivolumab  To help prevent nausea and vomiting after your treatment, we encourage you to take your nausea medication as ordered per MD.   If you develop nausea and vomiting that is not controlled by your nausea medication, call the clinic.   BELOW ARE SYMPTOMS THAT SHOULD BE REPORTED IMMEDIATELY:  *FEVER GREATER THAN 100.5 F  *CHILLS WITH OR WITHOUT FEVER  NAUSEA AND VOMITING THAT IS NOT CONTROLLED WITH YOUR NAUSEA MEDICATION  *UNUSUAL SHORTNESS OF BREATH  *UNUSUAL BRUISING OR BLEEDING  TENDERNESS IN MOUTH AND THROAT WITH OR WITHOUT PRESENCE OF ULCERS  *URINARY PROBLEMS  *BOWEL PROBLEMS  UNUSUAL RASH Items with * indicate a potential emergency and should be followed up as soon as possible.  Feel free to call the clinic you have any questions or concerns. The clinic phone number is [phone].  Please show the CHEMO ALERT CARD at check-in to the Emergency Department and triage nurse.   

## 2015-12-22 NOTE — Progress Notes (Signed)
[place]  [phone] [phone]  Clinic Follow up Note   Patient Care Team: [name], MD as PCP - General (Internal Medicine) 12/22/2015  Chief complaint: Follow-up metastatic melanoma  Oncology History   Metastatic melanoma [facility])   Staging form: Melanoma of the Skin, AJCC 7th Edition     Clinical stage from 09/21/2015: Stage IV [place], [place], [place]) - Signed by [name], MD on 10/09/2015       Metastatic melanoma ([place])   09/16/2015 Imaging    PET scan showed a hypermetabolic 2.9JJ lingular nodule, and a 46m right upper lobe lung nodule, left subclavicular nodes, and bone metastasis in the proximal right humerus and right femur.       09/21/2015 Initial Diagnosis    Metastatic melanoma ([place]      09/21/2015 Initial Biopsy    Left subclavicular lymph node biopsy showed prostatic of melanoma      09/21/2015 Miscellaneous    BRAF V600K mutation (+)       10/04/2015 Imaging    CT head with without contrast showed a 2.0 x 1.6 x 1.7 cm superficial left frontal hyperdense lesion, with postcontrast enhancement, lying within the previous hemorrhagic area, concerning for solitary melanoma metastasis      10/06/2015 -  Chemotherapy    Nivolumab 2412mevery 2 weeks       10/13/2015 - 10/13/2015 Radiation Therapy    SRS to his left frontal solitary brain metastasis       10/15/2015 Surgery    left front craniotomy and tumor excision       10/15/2015 Pathology Results    left frontal brain mass excision showed metastatic melanoma, 1.5X1.5X0.6cm       History of present illness (09/02/2015): 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 He was found to have left frontal hemorrhage 5.7 cm x 4.5 cm with 6 mm left to right shift.During the workup for his stroke, a left upper lung mass was noticed on the CT scan. I was called to evaluate his lung mass.  Patient has aphasia,  not able to communicate much. According to his daughter, Dr. LeGala Romneywho is a OB GYN at [facility] probably only smoked for very short period time during the college. He did have 3 skin melanoma, which was removed, last one was 2-3 years ago. No other cancer history.  CURRENT THERAPY: Nivolumab 240 mg, every 2 weeks, started on 10/06/2015  INTERVAL HISTORY: Mr. HaSattareturns for follow-up and nivolumab therapy. He presents to the clinic by himself today. He is doing well overall. His fatigue has improved since he started testosterone injection about 4 weeks ago. He has restarted playing golf last week, he did 1 hour each time and 3 times a week. He is able to tolerate routine activities, does still feel tired after a few hours. He feels his memory and cognitive function has improved, denies any neurological or other symptoms. His weight is stable.  REVIEW OF SYSTEMS:  Constitutional: Denies fevers, chills or abnormal weight loss, (+) mild fatigue Eyes: Denies blurriness of vision Ears, nose, mouth, throat, and face: Denies mucositis or sore throat Respiratory: Denies cough, dyspnea or wheezes Cardiovascular: Denies palpitation, chest discomfort or lower extremity swelling Gastrointestinal:  Denies nausea, heartburn or change in bowel habits Skin: Denies abnormal skin rashes Lymphatics: Denies new lymphadenopathy or easy bruising Neurological:see HPI  Behavioral/Psych: Mood is stable, no  new changes  All other systems were reviewed with the patient and are negative.  MEDICAL HISTORY:  Past Medical History:  Diagnosis Date  . Anginal pain ([place])   . Arthritis    mostly hands  . Benign familial hematuria   . BPH (benign prostatic hypertrophy)   . Brain cancer ([place])    melanoma with brain met  . Coronary artery disease   . GERD (gastroesophageal reflux disease)   . High cholesterol   . Hypertension   . Hypothyroidism   . Intracerebral hemorrhage ([place])   . Melanoma ([place])     . Metastatic melanoma to lung ([place])   . Mood disorder ([place])   . Myocardial infarction ([place])   . Pacemaker    due to syncope, 3rd degree HB (upgrade to [facility]. Jude CRT-P 02/25/13 (Dr. Uvaldo Rising)  . Rectal bleed    due to NSAIDS  . Renal disorder   . Skin cancer    melanoma  . Sleep apnea    uses CPAP  . Stroke [facility])     SURGICAL HISTORY: Past Surgical History:  Procedure Laterality Date  . APPLICATION OF CRANIAL NAVIGATION N/A 10/15/2015   Procedure: APPLICATION OF CRANIAL NAVIGATION;  Surgeon: Erline Levine, MD;  Location: [place] NEURO ORS;  Service: Neurosurgery;  Laterality: N/A;  . CARDIAC CATHETERIZATION  2013  . CARDIAC SURGERY     bypass X 2  . CORONARY ARTERY BYPASS GRAFT  2013   LIMA-LAD, SVG-PDA 03/27/11 (Dr. Francee Gentile)  . CRANIOTOMY N/A 10/15/2015   Procedure: LEFT FRONTAL CRANIOTOMY TUMOR EXCISION with Curve;  Surgeon: Erline Levine, MD;  Location: [place] NEURO ORS;  Service: Neurosurgery;  Laterality: N/A;  CRANIOTOMY TUMOR EXCISION  . EYE SURGERY    . FOOT SURGERY  08/2010  . JOINT REPLACEMENT Left    partial knee  . KNEE ARTHROSCOPY  12/2008  . LYMPH NODE BIOPSY    . PACEMAKER INSERTION    . TONSILLECTOMY      I have reviewed the social history and family history with the patient and they are unchanged from previous note.  ALLERGIES:  is allergic to zocor [simvastatin].  MEDICATIONS:  Current Outpatient Prescriptions  Medication Sig Dispense Refill  . acetaminophen (TYLENOL) 325 MG tablet Take 2 tablets (650 mg total) by mouth every 6 (six) hours as needed for mild pain. (Patient taking differently: Take 650 mg by mouth 3 (three) times daily. )    . alfuzosin (UROXATRAL) 10 MG 24 hr tablet TAKE 1 TABLET BY MOUTH EVERY DAY AT BREAKFAST 30 tablet 0  . carvedilol (COREG) 3.125 MG tablet Take 3.125 mg by mouth 2 (two) times daily with a meal.    . levothyroxine (SYNTHROID, LEVOTHROID) 125 MCG tablet TAKE 1 TABLET BY MOUTH EVERY MORNING BEFORE BREAKFAST 30 tablet 11  . Multiple  Vitamin (MULTIVITAMIN WITH MINERALS) TABS tablet Take 1 tablet by mouth daily.    . polyethylene glycol (MIRALAX / GLYCOLAX) packet Take 17 g by mouth daily as needed.     Marland Kitchen PRESCRIPTION MEDICATION Inhale into the lungs at bedtime. CPAP    . rosuvastatin (CRESTOR) 10 MG tablet Take 10 mg by mouth at bedtime.    . senna-docusate (SENOKOT-S) 8.6-50 MG tablet Take 1 tablet by mouth 2 (two) times daily.    . sertraline (ZOLOFT) 50 MG tablet Take 2.5 tablets (125 mg total) by mouth daily. (Patient taking differently: Take 100 mg by mouth daily. )    . Turmeric Curcumin 500 MG CAPS Take 500 mg by  mouth 2 (two) times daily.     No current facility-administered medications for this visit.     PHYSICAL EXAMINATION: ECOG PERFORMANCE STATUS: 2  Vitals:   12/22/15 0936  BP: 135/85  Pulse: 75  Resp: 17  Temp: 98 F (36.7 C)   Filed Weights   12/22/15 0936  Weight: 198 lb 8 oz (90 kg)    GENERAL:alert, no distress and comfortable SKIN: skin color, texture, turgor are normal, no rashes or significant lesions EYES: normal, Conjunctiva are pink and non-injected, sclera clear OROPHARYNX:no exudate, no erythema and lips, buccal mucosa, and tongue normal  NECK: supple, thyroid normal size, non-tender, without nodularity LYMPH:  no palpable lymphadenopathy in the cervical, [place], axillary or inguinal LUNGS: clear to auscultation and percussion with normal breathing effort HEART: regular rate & rhythm and no murmurs and no lower extremity edema ABDOMEN:abdomen soft, non-tender and normal bowel sounds Musculoskeletal:no cyanosis of digits and no clubbing  NEURO: alert & oriented x 3 with fluent speech, no focal motor/sensory deficits, he walks with a cane independently, although his gait is slightly wobbly.   LABORATORY DATA:  I have reviewed the data as listed CBC Latest Ref Rng & Units 12/22/2015 12/08/2015 11/24/2015  WBC 4.0 - 10.3 10e3/uL 4.0 3.9(L) 4.9  Hemoglobin 13.0 - 17.1 g/dL 12.8(L) 11.7(L)  11.7(L)  Hematocrit 38.4 - 49.9 % 38.6 35.2(L) 35.5(L)  Platelets 140 - 400 10e3/uL 162 174 312     CMP Latest Ref Rng & Units 12/22/2015 12/08/2015 11/24/2015  Glucose 70 - 140 mg/dl 129 133 78  BUN 7.0 - 26.0 mg/dL 22.0 23.3 29.0(H)  Creatinine 0.7 - 1.3 mg/dL 1.2 1.0 0.9  Sodium 136 - 145 mEq/L 140 140 139  Potassium 3.5 - 5.1 mEq/L 4.2 4.5 5.1  Chloride 101 - 111 mmol/L - - -  CO2 22 - 29 mEq/L 22 21(L) 24  Calcium 8.4 - 10.4 mg/dL 9.3 9.4 9.9  Total Protein 6.4 - 8.3 g/dL 6.7 6.5 6.9  Total Bilirubin 0.20 - 1.20 mg/dL 0.44 0.38 0.36  Alkaline Phos 40 - 150 U/L 63 64 62  AST 5 - 34 U/L _0 ALT 0 - 55 U/L _1 Results for MANSFIELD, DANN (MRN [id number]) as of 12/11/2015 21:00  Ref. Range 10/27/2015 08:55 11/10/2015 10:44 11/24/2015 12:08 11/24/2015 14:17 12/08/2015 10:25  TSH Latest Ref Range: 0.320 - 4.118 m(IU)/L 5.165 (H) 5.307 (H) 0.129 (L)  <0.080 (L)  T4,Free(Direct) Latest Ref Range: 0.82 - 1.77 ng/dL    2.12 (H)   Triiodothyronine,Free,Serum Latest Ref Range: 2.0 - 4.4 pg/mL    4.0     PATHOLOGY REPORT: Diagnosis 09/21/2015 Lymph node, needle/core biopsy, hypermetabolic left sub clavicular LN - METASTATIC MALIGNANT MELANOMA. - SEE COMMENT. Microscopic Comment The case was discussed with Dr. Alen Blew on 09/22/15. Additional studies can be performed upon clinician request. (JBK:gt, 09/22/15)  Diagnosis 10/15/2015 Brain, for tumor resection, Left frontal - METASTATIC MELANOMA. Microscopic Comment The tumor is a high grade malignancy with frequent mitotic figures and focal intracytoplasmic pigment. Immunohistochemistry shows strong positivity with S-100, HMB45 and Melan-A. (JDP:ecj 10/18/2015)      RADIOGRAPHIC STUDIES: I have personally reviewed the radiological images as listed and agreed with the findings in the report. No results found.   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   1. Metastatic melanoma to lung, node, bone and brain,  BRAF V600K mutation (+) -I previously  reviewed his PET/CT scan images with pt and his family members. The scan revealed hypermetabolic left upper lobe lung mass, 2 small right lung nodules, probable bone metastasis, and a subcutaneous left supraclavicular lesion, which is not palpable  -His repeat his CT had showed a 2.0 cm left frontal hyperdense lesion with enhancement on contrast, in the area of his recent hemorrhagic stroke, highly suspicious for brain metastasis.  -We discussed his left supraclavicular node biopsy results, which showed metastatic melanoma.. -His case was discussed in our sarcoma tumor   -I recommend a first-line treatment with Nivolumab alone, given his recent stroke, low tumor burden. I'll reserve BRAF inhibitor with or without MEK inhibitor as a second line therapy if he progress on immunotherapy. Other options of immunotherapy, especially checkpoint inhibitor with ipilumimab, were also discussed with pt and his daughter.  -He has been tolerating Nivolumab well, no significant side effects except mild fatigue We'll continue -He is now status post SBRT and surgical resection to his solitary brain metastasis on 10/15/15  -Lab results reviewed, adequate for treatment, we'll continue nivo  -He is scheduled to have the first restaging PET scan tomorrow  2. Hemorrhagic stroke and brain metastasis  -I encouraged him to continue physical therapy and occupational therapy. He has recovered  very well -History post SBRT and surgical resection of his solitary brain metastasis  -He will continue following with Dr. Joaquim Lai and Dr. Tammi Klippel  3. Fatigue -his fatigue has much improved after he restarted testosterone injection  4. Hypothyroidism  -will continue monitoring his TSH, his PCP recommends him to continue current synthroid dose  -his TSH has significantly  decreased 2 weeks ago, today's result is still pending -he sees his primary care physician Dr. Asher Muir every 2 weeks, who will adjust his Synthroid as needed   Plan -We'll proceed with Nivolumab treatment today and continue every 2 weeks  -PET scan tomorrow  -I'll see him back in 2 weeks   All questions were answered. The patient knows to call the clinic with any problems, questions or concerns. No barriers to learning was detected.  I spent 20 minutes counseling the patient face to face. The total time spent in the appointment was 30 minutes and more than 50% was on counseling and review of test results     [name], MD 12/22/15

## 2015-12-23 ENCOUNTER — Ambulatory Visit (HOSPITAL_COMMUNITY)
Admission: RE | Admit: 2015-12-23 | Discharge: 2015-12-23 | Disposition: A | Discharge status: Home / Self Care | Payer: Medicare Other | Source: Ambulatory Visit | Attending: Hematology | Admitting: Hematology

## 2015-12-23 DIAGNOSIS — C439 Malignant melanoma of skin, unspecified: Secondary | ICD-10-CM

## 2015-12-23 DIAGNOSIS — C799 Secondary malignant neoplasm of unspecified site: Secondary | ICD-10-CM | POA: Diagnosis not present

## 2015-12-23 DIAGNOSIS — I619 Nontraumatic intracerebral hemorrhage, unspecified: Secondary | ICD-10-CM | POA: Diagnosis not present

## 2015-12-23 LAB — GLUCOSE, CAPILLARY: GLUCOSE-CAPILLARY: 91 mg/dL (ref 65–99)

## 2015-12-23 MED ORDER — FLUDEOXYGLUCOSE F - 18 (FDG) INJECTION
10.1300 | Freq: Once | INTRAVENOUS | Status: AC | PRN
  Administered 2015-12-23: 10.13 via INTRAVENOUS

## 2015-12-28 ENCOUNTER — Encounter: Payer: Self-pay | Admitting: Neurology

## 2015-12-28 ENCOUNTER — Ambulatory Visit (INDEPENDENT_AMBULATORY_CARE_PROVIDER_SITE_OTHER): Payer: Medicare Other | Admitting: Neurology

## 2015-12-28 VITALS — BP 125/82 | HR 78 | Ht 69.0 in | Wt 200.4 lb

## 2015-12-28 DIAGNOSIS — I10 Essential (primary) hypertension: Secondary | ICD-10-CM | POA: Diagnosis not present

## 2015-12-28 DIAGNOSIS — C7931 Secondary malignant neoplasm of brain: Secondary | ICD-10-CM

## 2015-12-28 DIAGNOSIS — C799 Secondary malignant neoplasm of unspecified site: Secondary | ICD-10-CM

## 2015-12-28 DIAGNOSIS — Z950 Presence of cardiac pacemaker: Secondary | ICD-10-CM

## 2015-12-28 DIAGNOSIS — I611 Nontraumatic intracerebral hemorrhage in hemisphere, cortical: Secondary | ICD-10-CM

## 2015-12-28 DIAGNOSIS — E785 Hyperlipidemia, unspecified: Secondary | ICD-10-CM | POA: Diagnosis not present

## 2015-12-28 DIAGNOSIS — C439 Malignant melanoma of skin, unspecified: Secondary | ICD-10-CM

## 2015-12-28 NOTE — Progress Notes (Signed)
STROKE NEUROLOGY FOLLOW UP NOTE  NAME: David [name] DOB: 1940-10-07  REASON FOR VISIT: stroke follow up HISTORY FROM: pt and chart  Today we had the pleasure of seeing [name] in follow-up at our Neurology Clinic. Pt was accompanied by no one.   History Summary Lehman Whiteley is an 75 y.o. male history of hypertension, hypercholesterolemia, sleep apnea, and pacemaker placement for AV node disorder admitted on 08/22/15 for aphasia. His BP was 176/111 on presentation. Cardene drip was started for management of hypertensive urgency. CT showed left frontal large ICH. He was admitted to ICU. CTA head showed no vascular pathology. CUS and TTE unremarkable. Repeat CT head stable. LDL 135 and A1C 6.1. ICH etiology unclear, concerning for HTN or CAA. Recommend repeat CT in 3-4 weeks. He was discharged to CIR on 08/26/15 with crestor.   Chest CT on 08/23/15 showed left LL mass but due to high Cre, CT chest was done later in CIR showed LUL malignancy. Recommended PET which done on 09/16/15 confirmed LUL tumor but also showed multiple metastasis in the body. 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 Neurosurgery involved and pt had repeat CT on 10/13/15 showed growing left frontal lesion. Left frontal debulk surgery done on 10/16/15. Pt also underwent chemotherapy for melanoma metastasis and repeat PET on 12/23/15 showed excellent response to chemo.   Interval History During the interval time, the patient has been doing well. He had dramatic recovery. Basically, no neuro residue deficit. He felt good only sometimes had word finding difficulty with stress. No HA. Has followed with dermatology for skin lesion resection. On crestor for HLD without side effect. BP 125/82.   REVIEW OF SYSTEMS: Full 14 system review of systems performed and notable only for those listed below and in HPI above, all others are negative:  Constitutional:   Cardiovascular:    Ear/Nose/Throat:   Skin:  Eyes:   Respiratory:   Gastroitestinal:   Genitourinary:  Hematology/Lymphatic:   Endocrine:  Musculoskeletal:   Allergy/Immunology:   Neurological:   Psychiatric:  Sleep:   The following represents the patient's updated allergies and side effects list: Allergies  Allergen Reactions  . Zocor [Simvastatin] Other (See Comments)    Fever - temp of 103.   In 2003    The neurologically relevant items on the patient's problem list were reviewed on today's visit.  Neurologic Examination  A problem focused neurological exam (12 or more points of the single system neurologic examination, vital signs counts as 1 point, cranial nerves count for 8 points) was performed.  Blood pressure 125/82, pulse 78, height '5\' 9"'$  (1.753 m), weight 200 lb 6.4 oz (90.9 kg).  General - Well nourished, well developed, in no apparent distress.  Ophthalmologic - Fundi not visualized due to small pupils.  Cardiovascular - Regular rate and rhythm with no murmur.  Mental Status -  Level of arousal and orientation to time, place, and person were intact. Language including expression, naming, repetition, comprehension was assessed and found intact. Fund of Knowledge was assessed and was impaired.  Cranial Nerves II - XII - II - Visual field intact OU. III, IV, VI - Extraocular movements intact. V - Facial sensation intact bilaterally. VII - Facial movement intact bilaterally. VIII - Hearing & vestibular intact bilaterally. X - Palate elevates symmetrically. XI - Chin turning & shoulder shrug intact bilaterally. XII - Tongue protrusion intact.  Motor Strength - The patient's strength was normal in all extremities and pronator  drift was absent.  Bulk was normal and fasciculations were absent.   Motor Tone - Muscle tone was assessed at the neck and appendages and was normal.  Reflexes - The patient's reflexes were 1+ in all extremities and he had no pathological  reflexes.  Sensory - Light touch, temperature/pinprick were assessed and were normal.    Coordination - The patient had normal movements in the hands and feet with no ataxia or dysmetria.  Tremor was absent.  Gait and Station - The patient's transfers, posture, gait, station, and turns were observed as normal.   Functional score  mRS = 1   0 - No symptoms.   1 - No significant disability. Able to carry out all usual activities, despite some symptoms.   2 - Slight disability. Able to look after own affairs without assistance, but unable to carry out all previous activities.   3 - Moderate disability. Requires some help, but able to walk unassisted.   4 - Moderately severe disability. Unable to attend to own bodily needs without assistance, and unable to walk unassisted.   5 - Severe disability. Requires constant nursing care and attention, bedridden, incontinent.   6 - Dead.   NIH Stroke Scale = 0   Data reviewed: I personally reviewed the images and agree with the radiology interpretations.  Ct Head Wo Contrast 08/22/2015  Large left frontal lobe intraparenchymal acute hemorrhage with associated mass effect and approximately 6 mm left-to-right midline shift.   Chest Port 1 View 08/23/2015  Apparent left lower lobe mass measuring 3.8 x 3.0 cm. Advise correlation with chest CT, ideally with intravenous contrast, to further evaluate. Lungs elsewhere clear. Heart mildly enlarged with pacemaker leads as described. Patient is also status post coronary artery bypass grafting.   Ct Angio Head W/cm &/or Wo Cm 08/23/2015 Large left frontal hematoma unchanged from yesterday. No underlying vascular malformation. Consider amyloid and hypertension as causes of the hemorrhage.  CT Head 08/26/2015 Left frontal hematoma shows no interval enlargement. Similar local mass effect and vasogenic edema.  Carotid ultrasound There is no obvious evidence of hemodynamically significant  internal carotid artery stenosis bilaterally. Vertebral arteries are patent with antegrade flow.  2D echocardiogram - Left ventricle: The cavity size was normal. There was moderate concentric hypertrophy. Systolic function was vigorous. The estimated ejection fraction was in the range of 65% to 70%. Wall motion was normal; there were no regional wall motion abnormalities. Doppler parameters are consistent with abnormal left ventricular relaxation (grade 1 diastolic dysfunction). There was no evidence of elevated ventricular filling pressure by Doppler parameters. - Aortic valve: There was no regurgitation. - Aortic root: The aortic root was normal in size. - Ascending aorta: The ascending aorta was normal in size. - Mitral valve: There was no regurgitation. - Left atrium: The atrium was normal in size. - Right ventricle: The cavity size was normal. Wall thickness was normal. Systolic function was normal. - Right atrium: The atrium was normal in size. - Tricuspid valve: There was mild regurgitation. - Pulmonic valve: There was no regurgitation. - Pulmonary arteries: Systolic pressure was within the normal range. - Inferior vena cava: The vessel was normal in size. - Pericardium, extracardiac: There was no pericardial effusion.  Component     Latest Ref Rng & Units 08/23/2015 08/24/2015  Cholesterol     0 - 200 mg/dL  215 (H)  Triglycerides     <150 mg/dL  196 (H)  HDL Cholesterol     >40 mg/dL  41  Total CHOL/HDL Ratio     RATIO  5.2  VLDL     0 - 40 mg/dL  39  LDL (calc)     0 - 99 mg/dL  135 (H)  Hemoglobin A1C     4.8 - 5.6 % 6.1 (H)   Mean Plasma Glucose     mg/dL 128   TSH     0.350 - 4.500 uIU/mL 3.902   Vitamin B12     180 - 914 pg/mL 471     Assessment: As you may recall, he is a 75 y.o. Caucasian male with PMH of hypertension, hypercholesterolemia, sleep apnea, skin melanoma and pacemaker placement for AV node disorder admitted on 08/22/15 for left frontal large ICH.  CTA head showed no vascular pathology. CUS and TTE unremarkable. Repeat CT head stable. LDL 135 and A1C 6.1. ICH etiology unclear, concerning for HTN or CAA at that time. Recommended to repeat CT in 3-4 weeks. He was discharged to CIR on 08/26/15 with crestor. Chest CT on 08/23/15 showed left LL mass but due to high Cre, CT chest was done later in CIR showed LUL malignancy. PET done on 09/16/15 confirmed LUL tumor but also showed multiple metastasis in the body. 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 Neurosurgery involved and pt had repeat CT on 10/13/15 showed growing left frontal lesion. Left frontal debulk surgery done on 10/16/15. Pt also underwent chemotherapy for melanoma metastasis and repeat PET on 12/23/15 showed excellent response to chemo. Currently, no neuro residue deficit.   Plan:  - continue follow up with oncology Dr. Burr Medico - continue follow up with neurosurgery Dr. Vertell Limber - follow up with dermatology  - Follow up with your primary care physician for stroke risk factor modification. Recommend maintain blood pressure goal <130/80, diabetes with hemoglobin A1c goal below 7.0% and lipids with LDL cholesterol goal below 70 mg/dL.  - check BP at home  - follow up in 6 months.   No orders of the defined types were placed in this encounter.   No orders of the defined types were placed in this encounter.   Patient Instructions  - you recovered well from brain hemorrhage and surgery - continue follow up with oncology Dr. Burr Medico - continue follow up with neurosurgery Dr. Vertell Limber - follow up with dermatology  - Follow up with your primary care physician for stroke risk factor modification. Recommend maintain blood pressure goal <130/80, diabetes with hemoglobin A1c goal below 7.0% and lipids with LDL cholesterol goal below 70 mg/dL.  - check BP at home  - follow up in 6 months.    Rosalin Hawking, MD PhD [facility] [address]  [place], [place] [address] [phone]

## 2015-12-28 NOTE — Patient Instructions (Addendum)
-   you recovered well from brain hemorrhage and surgery - continue follow up with oncology Dr. Burr Medico - continue follow up with neurosurgery Dr. Vertell Limber - follow up with dermatology  - Follow up with your primary care physician for stroke risk factor modification. Recommend maintain blood pressure goal <130/80, diabetes with hemoglobin A1c goal below 7.0% and lipids with LDL cholesterol goal below 70 mg/dL.  - check BP at home  - follow up in 6 months.

## 2015-12-30 ENCOUNTER — Other Ambulatory Visit: Payer: Self-pay | Admitting: Internal Medicine

## 2016-01-05 ENCOUNTER — Other Ambulatory Visit (HOSPITAL_BASED_OUTPATIENT_CLINIC_OR_DEPARTMENT_OTHER): Payer: Medicare Other

## 2016-01-05 ENCOUNTER — Ambulatory Visit (HOSPITAL_BASED_OUTPATIENT_CLINIC_OR_DEPARTMENT_OTHER): Payer: Medicare Other | Admitting: Hematology

## 2016-01-05 ENCOUNTER — Telehealth: Payer: Self-pay | Admitting: Hematology

## 2016-01-05 ENCOUNTER — Encounter: Payer: Self-pay | Admitting: Hematology

## 2016-01-05 ENCOUNTER — Ambulatory Visit (HOSPITAL_BASED_OUTPATIENT_CLINIC_OR_DEPARTMENT_OTHER): Payer: Medicare Other

## 2016-01-05 VITALS — BP 155/93 | HR 81 | Temp 98.2°F | Resp 18 | Ht 69.0 in | Wt 200.6 lb

## 2016-01-05 DIAGNOSIS — I619 Nontraumatic intracerebral hemorrhage, unspecified: Secondary | ICD-10-CM

## 2016-01-05 DIAGNOSIS — C7931 Secondary malignant neoplasm of brain: Secondary | ICD-10-CM

## 2016-01-05 DIAGNOSIS — I10 Essential (primary) hypertension: Secondary | ICD-10-CM | POA: Diagnosis not present

## 2016-01-05 DIAGNOSIS — C7800 Secondary malignant neoplasm of unspecified lung: Secondary | ICD-10-CM

## 2016-01-05 DIAGNOSIS — C770 Secondary and unspecified malignant neoplasm of lymph nodes of head, face and neck: Secondary | ICD-10-CM

## 2016-01-05 DIAGNOSIS — E039 Hypothyroidism, unspecified: Secondary | ICD-10-CM

## 2016-01-05 DIAGNOSIS — Z8673 Personal history of transient ischemic attack (TIA), and cerebral infarction without residual deficits: Secondary | ICD-10-CM

## 2016-01-05 DIAGNOSIS — C799 Secondary malignant neoplasm of unspecified site: Secondary | ICD-10-CM

## 2016-01-05 DIAGNOSIS — C439 Malignant melanoma of skin, unspecified: Secondary | ICD-10-CM

## 2016-01-05 DIAGNOSIS — Z5112 Encounter for antineoplastic immunotherapy: Secondary | ICD-10-CM

## 2016-01-05 DIAGNOSIS — R5383 Other fatigue: Secondary | ICD-10-CM

## 2016-01-05 LAB — COMPREHENSIVE METABOLIC PANEL
ALBUMIN: 3.4 g/dL — AB (ref 3.5–5.0)
ALK PHOS: 69 U/L (ref 40–150)
ALT: 17 U/L (ref 0–55)
ANION GAP: 10 meq/L (ref 3–11)
AST: 22 U/L (ref 5–34)
BILIRUBIN TOTAL: 0.46 mg/dL (ref 0.20–1.20)
BUN: 20.7 mg/dL (ref 7.0–26.0)
CALCIUM: 9.4 mg/dL (ref 8.4–10.4)
CO2: 21 mEq/L — ABNORMAL LOW (ref 22–29)
Chloride: 107 mEq/L (ref 98–109)
Creatinine: 1 mg/dL (ref 0.7–1.3)
EGFR: 71 mL/min/{1.73_m2} — AB (ref >90)
GLUCOSE: 96 mg/dL (ref 70–140)
Potassium: 4.9 mEq/L (ref 3.5–5.1)
SODIUM: 139 meq/L (ref 136–145)
TOTAL PROTEIN: 6.6 g/dL (ref 6.4–8.3)

## 2016-01-05 LAB — CBC WITH DIFFERENTIAL/PLATELET
BASO%: 0.7 % (ref 0.0–2.0)
Basophils Absolute: 0 10*3/uL (ref 0.0–0.1)
EOS ABS: 0.2 10*3/uL (ref 0.0–0.5)
EOS%: 4.1 % (ref 0.0–7.0)
HEMATOCRIT: 43.1 % (ref 38.4–49.9)
HEMOGLOBIN: 14.3 g/dL (ref 13.0–17.1)
LYMPH#: 0.5 10*3/uL — AB (ref 0.9–3.3)
LYMPH%: 9.9 % — ABNORMAL LOW (ref 14.0–49.0)
MCH: 30.1 pg (ref 27.2–33.4)
MCHC: 33.2 g/dL (ref 32.0–36.0)
MCV: 90.7 fL (ref 79.3–98.0)
MONO#: 0.5 10*3/uL (ref 0.1–0.9)
MONO%: 11.2 % (ref 0.0–14.0)
NEUT%: 74.1 % (ref 39.0–75.0)
NEUTROS ABS: 3.5 10*3/uL (ref 1.5–6.5)
PLATELETS: 192 10*3/uL (ref 140–400)
RBC: 4.76 10*6/uL (ref 4.20–5.82)
RDW: 15.6 % — AB (ref 11.0–14.6)
WBC: 4.7 10*3/uL (ref 4.0–10.3)

## 2016-01-05 LAB — TSH: TSH: 0.482 m(IU)/L (ref 0.320–4.118)

## 2016-01-05 MED ORDER — SODIUM CHLORIDE 0.9 % IV SOLN
Freq: Once | INTRAVENOUS | Status: AC
  Administered 2016-01-05: 14:00:00 via INTRAVENOUS

## 2016-01-05 MED ORDER — SODIUM CHLORIDE 0.9 % IV SOLN
240.0000 mg | Freq: Once | INTRAVENOUS | Status: AC
  Administered 2016-01-05: 240 mg via INTRAVENOUS
  Filled 2016-01-05: qty 20

## 2016-01-05 NOTE — Progress Notes (Signed)
[place]  [phone] [phone]  Clinic Follow up Note   Patient Care Team: [name], MD as PCP - General (Internal Medicine) 01/05/2016  Chief complaint: Follow-up metastatic melanoma  Oncology History   Metastatic melanoma [facility])   Staging form: Melanoma of the Skin, AJCC 7th Edition     Clinical stage from 09/21/2015: Stage IV [place], [place], [place]) - Signed by [name], MD on 10/09/2015       Metastatic melanoma ([place])   09/16/2015 Imaging    PET scan showed a hypermetabolic 7.5IE lingular nodule, and a 28m right upper lobe lung nodule, left subclavicular nodes, and bone metastasis in the proximal right humerus and right femur.       09/21/2015 Initial Diagnosis    Metastatic melanoma ([place]      09/21/2015 Initial Biopsy    Left subclavicular lymph node biopsy showed prostatic of melanoma      09/21/2015 Miscellaneous    BRAF V600K mutation (+)       10/04/2015 Imaging    CT head with without contrast showed a 2.0 x 1.6 x 1.7 cm superficial left frontal hyperdense lesion, with postcontrast enhancement, lying within the previous hemorrhagic area, concerning for solitary melanoma metastasis      10/06/2015 -  Chemotherapy    Nivolumab 241mevery 2 weeks       10/13/2015 - 10/13/2015 Radiation Therapy    SRS to his left frontal solitary brain metastasis       10/15/2015 Surgery    left front craniotomy and tumor excision       10/15/2015 Pathology Results    left frontal brain mass excision showed metastatic melanoma, 1.5X1.5X0.6cm       History of present illness (09/02/2015): Pt is a [age].o. Retired dePharmacist, communityith history of HTN, OSA, CKD, PPM who was admitted on 08/22/15 with expressive difficulty. He was found to have left frontal hemorrhage 5.7 cm x 4.5 cm with 6 mm left to right shift.During the workup for his stroke, a left upper lung mass was noticed on the CT scan. I was called to evaluate his lung mass.  Patient has aphasia,  not able to communicate much. According to his daughter, Dr. LeGala Romneywho is a OB GYN at [facility] probably only smoked for very short period time during the college. He did have 3 skin melanoma, which was removed, last one was 2-3 years ago. No other cancer history.  CURRENT THERAPY: Nivolumab 240 mg, every 2 weeks, started on 10/06/2015  INTERVAL HISTORY: Mr. HaHolletteturns for follow-up and nivolumab therapy. He is accompanied by his daughter KeClaiborne Billingso the clinic today. He is doing well overall, his main complaint is still moderate fatigue. His energy level has overall improved lately, but has not back to his normal level. He is able to play golf 1 hour, a few times a week. No other new complaints. He has been tolerating Nivolumab infusional very well, mild fatigue after infusion, no other issues.   REVIEW OF SYSTEMS:  Constitutional: Denies fevers, chills or abnormal weight loss, (+) mild fatigue Eyes: Denies blurriness of vision Ears, nose, mouth, throat, and face: Denies mucositis or sore throat Respiratory: Denies cough, dyspnea or wheezes Cardiovascular: Denies palpitation, chest discomfort or lower extremity swelling Gastrointestinal:  Denies nausea, heartburn or change in bowel habits Skin: Denies abnormal skin rashes Lymphatics: Denies new lymphadenopathy or easy bruising Neurological:see HPI  Behavioral/Psych: Mood is stable, no new changes  All other systems were  reviewed with the patient and are negative.  MEDICAL HISTORY:  Past Medical History:  Diagnosis Date  . Anginal pain ([place])   . Arthritis    mostly hands  . Benign familial hematuria   . BPH (benign prostatic hypertrophy)   . Brain cancer ([place])    melanoma with brain met  . Coronary artery disease   . GERD (gastroesophageal reflux disease)   . High cholesterol   . Hypertension   . Hypothyroidism   . Intracerebral hemorrhage ([place])   . Melanoma ([place])   . Metastatic melanoma to lung ([place])   . Mood  disorder ([place])   . Myocardial infarction ([place])   . Pacemaker    due to syncope, 3rd degree HB (upgrade to [facility]. Jude CRT-P 02/25/13 (Dr. Uvaldo Rising)  . Rectal bleed    due to NSAIDS  . Renal disorder   . Skin cancer    melanoma  . Sleep apnea    uses CPAP  . Stroke [facility])     SURGICAL HISTORY: Past Surgical History:  Procedure Laterality Date  . APPLICATION OF CRANIAL NAVIGATION N/A 10/15/2015   Procedure: APPLICATION OF CRANIAL NAVIGATION;  Surgeon: Erline Levine, MD;  Location: [facility] NEURO ORS;  Service: Neurosurgery;  Laterality: N/A;  . CARDIAC CATHETERIZATION  2013  . CARDIAC SURGERY     bypass X 2  . CORONARY ARTERY BYPASS GRAFT  2013   LIMA-LAD, SVG-PDA 03/27/11 (Dr. Francee Gentile)  . CRANIOTOMY N/A 10/15/2015   Procedure: LEFT FRONTAL CRANIOTOMY TUMOR EXCISION with Curve;  Surgeon: Erline Levine, MD;  Location: [place] NEURO ORS;  Service: Neurosurgery;  Laterality: N/A;  CRANIOTOMY TUMOR EXCISION  . EYE SURGERY    . FOOT SURGERY  08/2010  . JOINT REPLACEMENT Left    partial knee  . KNEE ARTHROSCOPY  12/2008  . LYMPH NODE BIOPSY    . PACEMAKER INSERTION    . TONSILLECTOMY      I have reviewed the social history and family history with the patient and they are unchanged from previous note.  ALLERGIES:  is allergic to zocor [simvastatin].  MEDICATIONS:  Current Outpatient Prescriptions  Medication Sig Dispense Refill  . acetaminophen (TYLENOL) 325 MG tablet Take 2 tablets (650 mg total) by mouth every 6 (six) hours as needed for mild pain. (Patient taking differently: Take 650 mg by mouth 3 (three) times daily. )    . carvedilol (COREG) 3.125 MG tablet Take 3.125 mg by mouth 2 (two) times daily with a meal.    . levothyroxine (SYNTHROID, LEVOTHROID) 125 MCG tablet TAKE 1 TABLET BY MOUTH EVERY MORNING BEFORE BREAKFAST 30 tablet 11  . Multiple Vitamin (MULTIVITAMIN WITH MINERALS) TABS tablet Take 1 tablet by mouth daily.    Marland Kitchen PRESCRIPTION MEDICATION Inhale into the lungs at bedtime. CPAP    .  rosuvastatin (CRESTOR) 10 MG tablet Take 10 mg by mouth at bedtime.    . senna-docusate (SENOKOT-S) 8.6-50 MG tablet Take 1 tablet by mouth 2 (two) times daily.    . sertraline (ZOLOFT) 50 MG tablet Take 2.5 tablets (125 mg total) by mouth daily. (Patient taking differently: Take 100 mg by mouth daily. )    . Turmeric Curcumin 500 MG CAPS Take 500 mg by mouth 2 (two) times daily.    Marland Kitchen alfuzosin (UROXATRAL) 10 MG 24 hr tablet TAKE 1 TABLET BY MOUTH EVERY DAY AT BREAKFAST (Patient not taking: Reported on 01/05/2016) 30 tablet 0   No current facility-administered medications for this visit.     PHYSICAL  EXAMINATION: ECOG PERFORMANCE STATUS: 2  Vitals:   01/05/16 1220  BP: (!) 155/93  Pulse: 81  Resp: 18  Temp: 98.2 F (36.8 C)   Filed Weights   01/05/16 1220  Weight: 200 lb 9.6 oz (91 kg)    GENERAL:alert, no distress and comfortable SKIN: skin color, texture, turgor are normal, no rashes or significant lesions EYES: normal, Conjunctiva are pink and non-injected, sclera clear OROPHARYNX:no exudate, no erythema and lips, buccal mucosa, and tongue normal  NECK: supple, thyroid normal size, non-tender, without nodularity LYMPH:  no palpable lymphadenopathy in the cervical, [place], axillary or inguinal LUNGS: clear to auscultation and percussion with normal breathing effort HEART: regular rate & rhythm and no murmurs and no lower extremity edema ABDOMEN:abdomen soft, non-tender and normal bowel sounds Musculoskeletal:no cyanosis of digits and no clubbing  NEURO: alert & oriented x 3 with fluent speech, no focal motor/sensory deficits, he walks with a cane independently, although his gait is slightly wobbly.   LABORATORY DATA:  I have reviewed the data as listed CBC Latest Ref Rng & Units 01/05/2016 12/22/2015 12/08/2015  WBC 4.0 - 10.3 10e3/uL 4.7 4.0 3.9(L)  Hemoglobin 13.0 - 17.1 g/dL 14.3 12.8(L) 11.7(L)  Hematocrit 38.4 - 49.9 % 43.1 38.6 35.2(L)  Platelets 140 - 400 10e3/uL 192 162  174     CMP Latest Ref Rng & Units 01/05/2016 12/22/2015 12/08/2015  Glucose 70 - 140 mg/dl 96 129 133  BUN 7.0 - 26.0 mg/dL 20.7 22.0 23.3  Creatinine 0.7 - 1.3 mg/dL 1.0 1.2 1.0  Sodium 136 - 145 mEq/L 139 140 140  Potassium 3.5 - 5.1 mEq/L 4.9 4.2 4.5  Chloride 101 - 111 mmol/L - - -  CO2 22 - 29 mEq/L 21(L) 22 21(L)  Calcium 8.4 - 10.4 mg/dL 9.4 9.3 9.4  Total Protein 6.4 - 8.3 g/dL 6.6 6.7 6.5  Total Bilirubin 0.20 - 1.20 mg/dL 0.46 0.44 0.38  Alkaline Phos 40 - 150 U/L 69 63 64  AST 5 - 34 U/L 22 22 20   ALT 0 - 55 U/L 17 15 16    Results for TIN, ENGRAM (MRN [id number]) as of 01/05/2016 06:59  Ref. Range 11/24/2015 12:08 11/24/2015 14:17 12/08/2015 10:25 12/22/2015 08:52  TSH Latest Ref Range: 0.320 - 4.118 m(IU)/L 0.129 (L)  <0.080 (L) 0.278 (L)  Triiodothyronine,Free,Serum Latest Ref Range: 2.0 - 4.4 pg/mL  4.0    T4,Free(Direct) Latest Ref Range: 0.82 - 1.77 ng/dL  2.12 (H)     PATHOLOGY REPORT: Diagnosis 09/21/2015 Lymph node, needle/core biopsy, hypermetabolic left sub clavicular LN - METASTATIC MALIGNANT MELANOMA. - SEE COMMENT. Microscopic Comment The case was discussed with Dr. Alen Blew on 09/22/15. Additional studies can be performed upon clinician request. (JBK:gt, 09/22/15)  Diagnosis 10/15/2015 Brain, for tumor resection, Left frontal - METASTATIC MELANOMA. Microscopic Comment The tumor is a high grade malignancy with frequent mitotic figures and focal intracytoplasmic pigment. Immunohistochemistry shows strong positivity with S-100, HMB45 and Melan-A. (JDP:ecj 10/18/2015)      RADIOGRAPHIC STUDIES: I have personally reviewed the radiological images as listed and agreed with the findings in the report.  PET 12/23/2015 IMPRESSION: Improved PET-CT when compared to prior study. Some lesions have resolved and other lesions are smaller. This would suggest a good response to therapy. No new lesions/disease.    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   1. Metastatic melanoma to lung, node, bone  and brain, BRAF V600K mutation (+) -I previously  reviewed his PET/CT scan images with pt and his family members. The scan revealed hypermetabolic left upper lobe lung mass, 2 small right lung nodules, probable bone metastasis, and a subcutaneous left supraclavicular lesion, which is not palpable  -His repeat his CT had showed a 2.0 cm left frontal hyperdense lesion with enhancement on contrast, in the area of his recent hemorrhagic stroke, highly suspicious for brain metastasis.  -We discussed his left supraclavicular node biopsy results, which showed metastatic melanoma.. -His case was discussed in our sarcoma tumor   -I recommend first-line treatment with Nivolumab alone, given his recent stroke, low tumor burden. I'll reserve BRAF inhibitor with or without MEK inhibitor as a second line therapy if he progress on immunotherapy. Other options of immunotherapy, especially checkpoint inhibitor with ipilumimab, were also discussed with pt and his daughter. We reviewed his restaging PET scan images in person with patient and his daughter, he had excellent partial response, especially the lung lesion and subcutaneous nodules. No other new lesions. -We will continue Nivolumab every 2 weeks  -He has been tolerating Nivolumab well, no significant side effects except mild fatigue  -We reviewed the duration of nivolumab treatment, and alternative therapy options, especially with disease progression  -we reviewed the benefit and risks of adding Ipilumumab. Given his advanced age, fatigue, and his preference of quality of life, I will hold on ipi for now  -Lab results reviewed, adequate for treatment, we'll continue nivo   2. Hemorrhagic stroke and brain metastasis  -I encouraged him to continue physical therapy and occupational  therapy. He has recovered  very well -History post SBRT and surgical resection of his solitary brain metastasis  -He will continue following with Dr. Vertell Limber and Dr. Tammi Klippel  3. Fatigue -his fatigue has much improved after he restarted testosterone injection -I encouraged him to gradually increase his activity and exercise  4. Hypothyroidism  -will continue monitoring his TSH, his PCP recommends him to continue current synthroid dose  -his TSH has significantly decreased 2 weeks ago, today's result is still pending -he sees his primary care physician Dr. Asher Muir every 2 weeks, who will adjust his Synthroid as needed   5. HTN -his BP has been high lately -His lisinopril was held during his stroke. I recommend him to monitor his blood pressure at home, and resume lisinopril if blood pressure high  -He will follow-up with his primary care physician next week   Plan -We'll proceed with Nivolumab treatment today and continue every 2 weeks  -Flu shot and a pneumonia shot at his primary care physician's office  -Okay to restart lisinopril if his blood pressure is high at home.   All questions were answered. The patient knows to call the clinic with any problems, questions or concerns. No barriers to learning was detected.  I spent 30 minutes counseling the patient face to face. The total time spent in the appointment was 35 minutes and more than 50% was on counseling and review of test results     [name], MD 01/05/16

## 2016-01-05 NOTE — Patient Instructions (Signed)
[facility] Discharge Instructions for Patients Receiving Chemotherapy  Today you received the following chemotherapy agents, Nivolumab  To help prevent nausea and vomiting after your treatment, we encourage you to take your nausea medication as directed.   If you develop nausea and vomiting that is not controlled by your nausea medication, call the clinic.   BELOW ARE SYMPTOMS THAT SHOULD BE REPORTED IMMEDIATELY:  *FEVER GREATER THAN 100.5 F  *CHILLS WITH OR WITHOUT FEVER  NAUSEA AND VOMITING THAT IS NOT CONTROLLED WITH YOUR NAUSEA MEDICATION  *UNUSUAL SHORTNESS OF BREATH  *UNUSUAL BRUISING OR BLEEDING  TENDERNESS IN MOUTH AND THROAT WITH OR WITHOUT PRESENCE OF ULCERS  *URINARY PROBLEMS  *BOWEL PROBLEMS  UNUSUAL RASH Items with * indicate a potential emergency and should be followed up as soon as possible.  Feel free to call the clinic you have any questions or concerns. The clinic phone number is [phone].  Please show the [place] at check-in to the Emergency Department and triage nurse.

## 2016-01-05 NOTE — Telephone Encounter (Signed)
Gave patient avs report and appointments for October and November.  °

## 2016-01-11 ENCOUNTER — Ambulatory Visit: Payer: Medicare Other | Admitting: Internal Medicine

## 2016-01-13 ENCOUNTER — Ambulatory Visit (HOSPITAL_COMMUNITY)
Admission: RE | Admit: 2016-01-13 | Discharge: 2016-01-13 | Disposition: A | Discharge status: Home / Self Care | Payer: Medicare Other | Source: Ambulatory Visit | Attending: Radiation Oncology | Admitting: Radiation Oncology

## 2016-01-13 DIAGNOSIS — C7931 Secondary malignant neoplasm of brain: Secondary | ICD-10-CM | POA: Diagnosis not present

## 2016-01-13 DIAGNOSIS — C7949 Secondary malignant neoplasm of other parts of nervous system: Secondary | ICD-10-CM | POA: Insufficient documentation

## 2016-01-13 DIAGNOSIS — Z950 Presence of cardiac pacemaker: Secondary | ICD-10-CM | POA: Diagnosis not present

## 2016-01-13 MED ORDER — IOPAMIDOL (ISOVUE-300) INJECTION 61%
75.0000 mL | Freq: Once | INTRAVENOUS | Status: AC | PRN
  Administered 2016-01-13: 75 mL via INTRAVENOUS

## 2016-01-17 ENCOUNTER — Ambulatory Visit: Admission: RE | Admit: 2016-01-17 | Payer: Medicare Other | Source: Ambulatory Visit | Admitting: Radiation Oncology

## 2016-01-19 ENCOUNTER — Ambulatory Visit (HOSPITAL_BASED_OUTPATIENT_CLINIC_OR_DEPARTMENT_OTHER): Payer: Medicare Other

## 2016-01-19 ENCOUNTER — Other Ambulatory Visit: Payer: Self-pay | Admitting: Hematology

## 2016-01-19 ENCOUNTER — Encounter: Payer: Self-pay | Admitting: Radiation Oncology

## 2016-01-19 ENCOUNTER — Other Ambulatory Visit (HOSPITAL_BASED_OUTPATIENT_CLINIC_OR_DEPARTMENT_OTHER): Payer: Medicare Other

## 2016-01-19 ENCOUNTER — Ambulatory Visit
Admission: RE | Admit: 2016-01-19 | Discharge: 2016-01-19 | Disposition: A | Discharge status: Home / Self Care | Payer: Medicare Other | Source: Ambulatory Visit | Attending: Radiation Oncology | Admitting: Radiation Oncology

## 2016-01-19 DIAGNOSIS — C770 Secondary and unspecified malignant neoplasm of lymph nodes of head, face and neck: Secondary | ICD-10-CM

## 2016-01-19 DIAGNOSIS — Z888 Allergy status to other drugs, medicaments and biological substances status: Secondary | ICD-10-CM | POA: Diagnosis not present

## 2016-01-19 DIAGNOSIS — C439 Malignant melanoma of skin, unspecified: Secondary | ICD-10-CM | POA: Diagnosis present

## 2016-01-19 DIAGNOSIS — Z79899 Other long term (current) drug therapy: Secondary | ICD-10-CM

## 2016-01-19 DIAGNOSIS — Z5112 Encounter for antineoplastic immunotherapy: Secondary | ICD-10-CM

## 2016-01-19 DIAGNOSIS — C7931 Secondary malignant neoplasm of brain: Secondary | ICD-10-CM | POA: Insufficient documentation

## 2016-01-19 DIAGNOSIS — C799 Secondary malignant neoplasm of unspecified site: Secondary | ICD-10-CM

## 2016-01-19 DIAGNOSIS — C7800 Secondary malignant neoplasm of unspecified lung: Secondary | ICD-10-CM | POA: Diagnosis not present

## 2016-01-19 DIAGNOSIS — R911 Solitary pulmonary nodule: Secondary | ICD-10-CM | POA: Diagnosis not present

## 2016-01-19 LAB — CBC WITH DIFFERENTIAL/PLATELET
BASO%: 0.2 % (ref 0.0–2.0)
BASOS ABS: 0 10*3/uL (ref 0.0–0.1)
EOS%: 4.2 % (ref 0.0–7.0)
Eosinophils Absolute: 0.2 10*3/uL (ref 0.0–0.5)
HCT: 42.5 % (ref 38.4–49.9)
HEMOGLOBIN: 14.4 g/dL (ref 13.0–17.1)
LYMPH%: 10.3 % — ABNORMAL LOW (ref 14.0–49.0)
MCH: 30.5 pg (ref 27.2–33.4)
MCHC: 33.9 g/dL (ref 32.0–36.0)
MCV: 90 fL (ref 79.3–98.0)
MONO#: 0.3 10*3/uL (ref 0.1–0.9)
MONO%: 8 % (ref 0.0–14.0)
NEUT#: 3.3 10*3/uL (ref 1.5–6.5)
NEUT%: 77.3 % — AB (ref 39.0–75.0)
Platelets: 150 10*3/uL (ref 140–400)
RBC: 4.72 10*6/uL (ref 4.20–5.82)
RDW: 14.9 % — AB (ref 11.0–14.6)
WBC: 4.3 10*3/uL (ref 4.0–10.3)
lymph#: 0.4 10*3/uL — ABNORMAL LOW (ref 0.9–3.3)

## 2016-01-19 LAB — COMPREHENSIVE METABOLIC PANEL
ALT: 15 U/L (ref 0–55)
AST: 23 U/L (ref 5–34)
Albumin: 3.5 g/dL (ref 3.5–5.0)
Alkaline Phosphatase: 67 U/L (ref 40–150)
Anion Gap: 10 mEq/L (ref 3–11)
BUN: 23 mg/dL (ref 7.0–26.0)
CO2: 22 meq/L (ref 22–29)
Calcium: 9.2 mg/dL (ref 8.4–10.4)
Chloride: 107 mEq/L (ref 98–109)
Creatinine: 1.1 mg/dL (ref 0.7–1.3)
EGFR: 65 mL/min/{1.73_m2} — AB (ref >90)
GLUCOSE: 134 mg/dL (ref 70–140)
POTASSIUM: 4.6 meq/L (ref 3.5–5.1)
SODIUM: 139 meq/L (ref 136–145)
TOTAL PROTEIN: 6.6 g/dL (ref 6.4–8.3)
Total Bilirubin: 0.42 mg/dL (ref 0.20–1.20)

## 2016-01-19 LAB — TSH: TSH: 2.292 m[IU]/L (ref 0.320–4.118)

## 2016-01-19 MED ORDER — SODIUM CHLORIDE 0.9 % IV SOLN
240.0000 mg | Freq: Once | INTRAVENOUS | Status: AC
  Administered 2016-01-19: 240 mg via INTRAVENOUS
  Filled 2016-01-19: qty 20

## 2016-01-19 MED ORDER — SODIUM CHLORIDE 0.9 % IV SOLN
Freq: Once | INTRAVENOUS | Status: AC
  Administered 2016-01-19: 12:00:00 via INTRAVENOUS

## 2016-01-19 NOTE — Progress Notes (Signed)
Radiation Oncology         [phone]   Name: David [name]   Date: 01/19/2016   MRN: [id number]  DOB: [birth year]    Multidisciplinary Brain and Spine Oncology Clinic Follow-Up Visit Note  CC: [name], MD  [name], MD    ICD-9-CM ICD-10-CM   1. Metastasis to brain (HCC) 198.3 C79.31     Diagnosis:   75 yo man with a new 6 mm right cerebellar brain met from melanoma  Interval Since Last Radiation:  3 months  Narrative:  The patient returns today for routine follow-up.  The recent films were presented in our multidisciplinary conference with neuroradiology just prior to the clinic.  He is doing well without ongoing neurologic sequelae of previous SRS and surgery.                              ALLERGIES:  is allergic to zocor [simvastatin].  Meds: Current Outpatient Prescriptions  Medication Sig Dispense Refill  . acetaminophen (TYLENOL) 325 MG tablet Take 2 tablets (650 mg total) by mouth every 6 (six) hours as needed for mild pain. (Patient taking differently: Take 650 mg by mouth 3 (three) times daily. )    . carvedilol (COREG) 3.125 MG tablet Take 3.125 mg by mouth 2 (two) times daily with a meal.    . levothyroxine (SYNTHROID, LEVOTHROID) 125 MCG tablet TAKE 1 TABLET BY MOUTH EVERY MORNING BEFORE BREAKFAST (Patient taking differently: TAKE 1 TABLET (100 mg) BY MOUTH EVERY MORNING BEFORE BREAKFAST) 30 tablet 11  . Multiple Vitamin (MULTIVITAMIN WITH MINERALS) TABS tablet Take 1 tablet by mouth daily.    Marland Kitchen PRESCRIPTION MEDICATION Inhale into the lungs at bedtime. CPAP    . rosuvastatin (CRESTOR) 10 MG tablet Take 10 mg by mouth at bedtime.    . senna-docusate (SENOKOT-S) 8.6-50 MG tablet Take 1 tablet by mouth 2 (two) times daily.    . sertraline (ZOLOFT) 50 MG tablet Take 2.5 tablets (125 mg total) by mouth daily. (Patient taking differently: Take 100 mg by mouth daily. )    . Turmeric Curcumin 500 MG CAPS Take 500 mg by mouth 2 (two) times daily.     No current  facility-administered medications for this encounter.     Physical Findings: The patient is in no acute distress. Patient is alert and oriented.  weight is 202 lb 6.4 oz (91.8 kg). His blood pressure is 158/118 (abnormal) and his pulse is 75. His respiration is 18. .  No significant changes.  Lab Findings: Lab Results  Component Value Date   WBC 4.3 01/19/2016   HGB 14.4 01/19/2016   HCT 42.5 01/19/2016   MCV 90.0 01/19/2016   PLT 150 01/19/2016    @LASTCHEM @  Radiographic Findings: Ct Head W Wo Contrast  Result Date: 01/13/2016 CLINICAL DATA:  75 y/o M; metastatic melanoma post resection and SBRT for follow-up. EXAM: CT HEAD WITHOUT AND WITH CONTRAST TECHNIQUE: Contiguous axial images were obtained from the base of the skull through the vertex without and with intravenous contrast CONTRAST:  51m ISOVUE-300 IOPAMIDOL (ISOVUE-300) INJECTION 61%<Contrast>733mISOVUE-300 IOPAMIDOL (ISOVUE-300) INJECTION 61% COMPARISON:  CT head 10/17/2015. FINDINGS: Brain: Left frontal encephalomalacia from prior tumor resection. There is ill-defined enhancement of the resection cavity spanning a region of approximately 32 x 26 mm (series 3, image 16) and there is dural enhancement subjacent to the craniotomy flap. 6 mm hyperdense focus with uncertain enhancement in the right cerebellar  hemisphere (series 2, image 10), was not apparent on prior studies. No evidence of large territory infarct. There is interval mild enlargement of the lateral and third ventricles and ex vacuo dilatation of frontal horn of left lateral ventricle. Vascular: Patent circle of Willis. Normal enhancement of dural venous sinuses. Skull: Postsurgical changes related to left frontotemporal craniotomy with interval near complete resolution of scalp edema. No new destructive osseous lesion is identified appear Sinuses/Orbits: No acute finding. Other: None. IMPRESSION: 1. Ill-defined enhancement and dural thickening of the left frontal  resection cavity may represent posttreatment changes or residual/recurrent neoplasm. Follow-up is recommended. 2. New 6 mm hyperdensity in right cerebellar hemisphere with uncertain enhancement may represent a metastasis or small brain parenchymal hemorrhage. 3. Mild interval dilatation of the lateral and third ventricles and resolution of left-to-right midline shift probably due to resolving left hemisphere edema and ex vacuo dilatation. These results will be called to the ordering clinician or representative by the Radiologist Assistant, and communication documented in the PACS or zVision Dashboard. Electronically Signed   By: Kristine Garbe M.D.   On: 01/13/2016 15:59   Nm Pet Image Restage (ps) Whole Body  Result Date: 12/23/2015 CLINICAL DATA:  Subsequent treatment strategy for metastatic melanoma EXAM: NUCLEAR MEDICINE PET WHOLE BODY TECHNIQUE: 10.2 mCi F-18 FDG was injected intravenously. Full-ring PET imaging was performed from the vertex to the feet after the radiotracer. CT data was obtained and used for attenuation correction and anatomic localization. FASTING BLOOD GLUCOSE:  Value:  91 mg/dl COMPARISON:  PET-CT 09/16/2015 FINDINGS: Head/Neck: Persistent bilateral subclavicular paratracheal hypermetabolic nodules, possibly in the thyroid gland. The right nodule measures 13 mm on the left nodule measures 17 mm. The right-sided lesion has an SUV max of 6.9 and the left 7.7. Small hypermetabolic subcutaneous nodule just anterior left clavicle is no longer identified. Chest: Hypermetabolic lingular nodule measures 11.5 mm and SUV max is 5.8. It previously measured 25 mm and SUV max was 17.6. Right upper lobe pulmonary nodule previously measured 6 mm and now measures 3 mm. No hypermetabolism. Persistent area of hypermetabolism in the ill-defined right lower lobe density in the as ago esophageal recess. SUV max is 4.4. Abdomen/Pelvis: No abnormal hypermetabolic activity within the liver, pancreas,  adrenal glands, or spleen. No hypermetabolic lymph nodes in the abdomen or pelvis. Small hypermetabolic lesion along the surface of the left paraspinal muscles at L1 is no longer visualized. Skeleton: Stable/persistent hypermetabolism in the upper right humerus with SUV max of 6.3. Lesion in the right iliac crest anteriorly is stable. SUV max is 4.7. Persistent hypermetabolism along the cortex of the right femur along the greater trochanter mild periosteal reaction of CT. Extremities: No hypermetabolic activity to suggest metastasis. IMPRESSION: Improved PET-CT when compared to prior study. Some lesions have resolved and other lesions are smaller. This would suggest a good response to therapy. No new lesions/disease. Electronically Signed   By: Marijo Sanes M.D.   On: 12/23/2015 17:12    Impression:  The patient has a new 6 mm brain met.  At this point, the patient would potentially benefit from radiotherapy. The options include whole brain irradiation versus stereotactic radiosurgery. There are pros and cons associated with each of these potential treatment options. Whole brain radiotherapy would treat the known metastatic deposits and help provide some reduction of risk for future brain metastases. However, whole brain radiotherapy carries potential risks including hair loss, subacute somnolence, and neurocognitive changes including a possible reduction in short-term memory. Whole brain radiotherapy also  may carry a lower likelihood of tumor control at the treatment sites because of the low-dose used. Stereotactic radiosurgery carries a higher likelihood for local tumor control at the targeted sites with lower associated risk for neurocognitive changes such as memory loss. However, the use of stereotactic radiosurgery in this setting may leave the patient at increased risk for new brain metastases elsewhere in the brain as high as 50-60%. Accordingly, patients who receive stereotactic radiosurgery in this  setting should undergo ongoing surveillance imaging with brain MRI more frequently in order to identify and treat new small brain metastases before they become symptomatic. Stereotactic radiosurgery does carry some different risks, including a risk of radionecrosis.  PLAN: Today, I reviewed the findings and workup thus far with the patient. We discussed the dilemma regarding whole brain radiotherapy versus stereotactic radiosurgery. We discussed the pros and cons of each. We also discussed the logistics and delivery of each. We reviewed the results associated with each of the treatments described above. The patient seems to understand the treatment options and would like to proceed with stereotactic radiosurgery.  Sim Friday and SRS treatment next Thursday 10/19 at noon  _____________________________________  Sheral Apley. Tammi Klippel, M.D.

## 2016-01-19 NOTE — Progress Notes (Signed)
Patient returns today to review head CT. Weight stable. BP elevated despite taking routine medication this morning. Reports a decline of short term memory. Reports an unsteady gait. Fatigues easily requiring rest breaks throughout the day. Denies headaches, dizziness, diplopia or ringing in the ears. Denies chest pain, cough or SOB. Confirms he is eating and sleeping without difficulty.  Scheduled to follow up with Dr. Burr Medico in two weeks. Scheduled to follow up with PCP in five days.   BP (!) 158/118 (BP Location: Left Arm, Patient Position: Sitting, Cuff Size: Normal) Comment: manuel  Pulse 75   Resp 18   Wt 202 lb 6.4 oz (91.8 kg)   BMI 29.89 kg/m  Wt Readings from Last 3 Encounters:  01/19/16 202 lb 6.4 oz (91.8 kg)  01/05/16 200 lb 9.6 oz (91 kg)  12/28/15 200 lb 6.4 oz (90.9 kg)

## 2016-01-19 NOTE — Patient Instructions (Signed)
[facility] Discharge Instructions for Patients Receiving Chemotherapy  Today you received the following chemotherapy agents, Nivolumab  To help prevent nausea and vomiting after your treatment, we encourage you to take your nausea medication as directed.   If you develop nausea and vomiting that is not controlled by your nausea medication, call the clinic.   BELOW ARE SYMPTOMS THAT SHOULD BE REPORTED IMMEDIATELY:  *FEVER GREATER THAN 100.5 F  *CHILLS WITH OR WITHOUT FEVER  NAUSEA AND VOMITING THAT IS NOT CONTROLLED WITH YOUR NAUSEA MEDICATION  *UNUSUAL SHORTNESS OF BREATH  *UNUSUAL BRUISING OR BLEEDING  TENDERNESS IN MOUTH AND THROAT WITH OR WITHOUT PRESENCE OF ULCERS  *URINARY PROBLEMS  *BOWEL PROBLEMS  UNUSUAL RASH Items with * indicate a potential emergency and should be followed up as soon as possible.  Feel free to call the clinic you have any questions or concerns. The clinic phone number is [phone].  Please show the [facility] at check-in to the Emergency Department and triage nurse.

## 2016-01-21 ENCOUNTER — Ambulatory Visit
Admission: RE | Admit: 2016-01-21 | Discharge: 2016-01-21 | Disposition: A | Discharge status: Home / Self Care | Payer: Medicare Other | Source: Ambulatory Visit | Attending: Radiation Oncology | Admitting: Radiation Oncology

## 2016-01-21 VITALS — BP 140/98 | HR 78 | Resp 16

## 2016-01-21 DIAGNOSIS — C7931 Secondary malignant neoplasm of brain: Secondary | ICD-10-CM

## 2016-01-21 DIAGNOSIS — Z923 Personal history of irradiation: Secondary | ICD-10-CM | POA: Insufficient documentation

## 2016-01-21 DIAGNOSIS — Z510 Encounter for antineoplastic radiation therapy: Secondary | ICD-10-CM | POA: Diagnosis not present

## 2016-01-21 DIAGNOSIS — R32 Unspecified urinary incontinence: Secondary | ICD-10-CM | POA: Diagnosis not present

## 2016-01-21 DIAGNOSIS — I252 Old myocardial infarction: Secondary | ICD-10-CM | POA: Diagnosis not present

## 2016-01-21 DIAGNOSIS — R4701 Aphasia: Secondary | ICD-10-CM | POA: Diagnosis not present

## 2016-01-21 DIAGNOSIS — C439 Malignant melanoma of skin, unspecified: Secondary | ICD-10-CM | POA: Diagnosis present

## 2016-01-21 DIAGNOSIS — E7800 Pure hypercholesterolemia, unspecified: Secondary | ICD-10-CM | POA: Diagnosis not present

## 2016-01-21 DIAGNOSIS — C799 Secondary malignant neoplasm of unspecified site: Secondary | ICD-10-CM | POA: Diagnosis present

## 2016-01-21 DIAGNOSIS — C7800 Secondary malignant neoplasm of unspecified lung: Secondary | ICD-10-CM

## 2016-01-21 DIAGNOSIS — I10 Essential (primary) hypertension: Secondary | ICD-10-CM | POA: Diagnosis not present

## 2016-01-21 DIAGNOSIS — G4730 Sleep apnea, unspecified: Secondary | ICD-10-CM | POA: Diagnosis not present

## 2016-01-21 MED ORDER — SODIUM CHLORIDE 0.9% FLUSH
10.0000 mL | INTRAVENOUS | Status: AC
  Administered 2016-01-21: 10 mL via INTRAVENOUS

## 2016-01-21 NOTE — Progress Notes (Signed)
Does patient have an allergy to IV contrast dye?: No.   Has patient ever received premedication for IV contrast dye?: No.   Does patient take metformin?: No.  If patient does take metformin when was the last dose: N/A  Date of lab work: 01/19/16 BUN: 23 CR: 1.1  IV site: antecubital right, condition no redness  BP (!) 140/98 (BP Location: Right Arm, Patient Position: Sitting, Cuff Size: Normal)   Pulse 78   Resp 16   SpO2 100%   PATIENT HAS A PACEMAKER.

## 2016-01-21 NOTE — Progress Notes (Signed)
  Radiation Oncology         [phone]) [phone] ________________________________  Name: David [name] MRN: [id number]  Date: 01/21/2016  DOB: 06/23/1940  SIMULATION AND TREATMENT PLANNING NOTE    ICD-9-CM ICD-10-CM   1. Metastasis to brain ([facility]) 198.3 C79.31     DIAGNOSIS:  75 yo man with a new 6 mm right cerebellar brain met from melanoma  NARRATIVE:  The patient was brought to the [facility].  Identity was confirmed.  All relevant records and images related to the planned course of therapy were reviewed.  The patient freely provided informed written consent to proceed with treatment after reviewing the details related to the planned course of therapy. The consent form was witnessed and verified by the simulation staff. Intravenous access was established for contrast administration. Then, the patient was set-up in a stable reproducible supine position for radiation therapy.  A relocatable thermoplastic stereotactic head frame was fabricated for precise immobilization.  CT images were obtained.  Surface markings were placed.  The CT images were loaded into the planning software and fused with the patient's targeting MRI scan.  Then the target and avoidance structures were contoured.  Treatment planning then occurred.  The radiation prescription was entered and confirmed.  I have requested 3D planning  I have requested a DVH of the following structures: Brain stem, brain, left eye, right eye, lenses, optic chiasm, target volumes, uninvolved brain, and normal tissue.    SPECIAL TREATMENT PROCEDURE:  The planned course of therapy using radiation constitutes a special treatment procedure. Special care is required in the management of this patient for the following reasons. This treatment constitutes a Special Treatment Procedure for the following reason: High dose per fraction requiring special monitoring for increased toxicities of treatment including daily imaging.  The special nature of  the planned course of radiotherapy will require increased physician supervision and oversight to ensure patient's safety with optimal treatment outcomes.  PLAN:  The patient will receive 20 Gy in 1 fraction.  ________________________________  Sheral Apley Tammi Klippel, M.D.

## 2016-01-26 DIAGNOSIS — C7931 Secondary malignant neoplasm of brain: Secondary | ICD-10-CM | POA: Diagnosis not present

## 2016-01-27 ENCOUNTER — Ambulatory Visit
Admission: RE | Admit: 2016-01-27 | Discharge: 2016-01-27 | Disposition: A | Discharge status: Home / Self Care | Payer: Medicare Other | Source: Ambulatory Visit | Attending: Radiation Oncology | Admitting: Radiation Oncology

## 2016-01-27 ENCOUNTER — Encounter: Payer: Self-pay | Admitting: Radiation Oncology

## 2016-01-27 VITALS — BP 149/103 | HR 75 | Temp 97.9°F | Resp 20

## 2016-01-27 DIAGNOSIS — C7931 Secondary malignant neoplasm of brain: Secondary | ICD-10-CM

## 2016-01-27 DIAGNOSIS — C439 Malignant melanoma of skin, unspecified: Secondary | ICD-10-CM

## 2016-01-27 DIAGNOSIS — C799 Secondary malignant neoplasm of unspecified site: Secondary | ICD-10-CM

## 2016-01-27 NOTE — Op Note (Signed)
  Name: [name]  MRN: [id number]  Date: 01/27/2016   DOB: July 05, 1940  Stereotactic Radiosurgery Operative Note  PRE-OPERATIVE DIAGNOSIS:  Solitary Brain Metastasis  POST-OPERATIVE DIAGNOSIS:  Solitary Brain Metastasis  PROCEDURE:  Stereotactic Radiosurgery  SURGEON:  Peggyann Shoals, MD  NARRATIVE: The patient underwent a radiation treatment planning session in the radiation oncology simulation suite under the care of the radiation oncology physician and physicist.  I participated closely in the radiation treatment planning afterwards. The patient underwent planning CT which was fused to 3T high resolution MRI with 1 mm axial slices.  These images were fused on the planning system.  We contoured the gross target volumes and subsequently expanded this to yield the Planning Target Volume. I actively participated in the planning process.  I helped to define and review the target contours and also the contours of the optic pathway, eyes, brainstem and selected nearby organs at risk.  All the dose constraints for critical structures were reviewed and compared to AAPM Task Group 101.  The prescription dose conformity was reviewed.  I approved the plan electronically.    Accordingly, David [name] was brought to the TrueBeam stereotactic radiation treatment linac and placed in the custom immobilization mask.  The patient was aligned according to the IR fiducial markers with BrainLab Exactrac, then orthogonal x-rays were used in ExacTrac with the 6DOF robotic table and the shifts were made to align the patient  David [name] received stereotactic radiosurgery uneventfully.    The detailed description of the procedure is recorded in the radiation oncology procedure note.  I was present for the duration of the procedure.  DISPOSITION:  Following delivery, the patient was transported to nursing in stable condition and monitored for possible acute effects to be discharged to home in stable condition  with follow-up in one month.  Peggyann Shoals, MD 01/27/2016 12:50 PM

## 2016-01-27 NOTE — Progress Notes (Signed)
2nd set vitals taken, patient denies any pain, nausea, dizzy ness, blurred vision, no tinnitus, alert,oriented x4,  "I feel fine', d/c patient home ,ambulatory,steady gait, wife at side, will call for any unusual symptoms if they occur   1:39 PM

## 2016-01-27 NOTE — Progress Notes (Signed)
Received patient in nursing following [place] treatment. Patient accompanied by wife. No distress noted. Patient alert and oriented x 3. Denies headache, nausea, dizziness, diplopia or ringing in the ears. Denies taking decadron at this time. Will continue to monitor patient for next thirty minutes.   BP (!) 156/103 (BP Location: Left Arm, Patient Position: Sitting, Cuff Size: Normal)   Pulse 75   Temp 97.5 F (36.4 C) (Oral)   Resp 18   SpO2 100%  Wt Readings from Last 3 Encounters:  01/19/16 202 lb 6.4 oz (91.8 kg)  01/05/16 200 lb 9.6 oz (91 kg)  12/28/15 200 lb 6.4 oz (90.9 kg)

## 2016-01-27 NOTE — Progress Notes (Signed)
  Radiation Oncology         [phone]) [phone] ________________________________  Stereotactic Treatment Procedure Note  Name: David [name] MRN: [id number]  Date: 01/27/2016  DOB: [birth year]  SPECIAL TREATMENT PROCEDURE    ICD-9-CM ICD-10-CM   1. Metastatic melanoma ([facility]) 172.9 C79.9   2. Metastasis to brain ([place]) 198.3 C79.31     3D TREATMENT PLANNING AND DOSIMETRY:  The patient's radiation plan was reviewed and approved by neurosurgery and radiation oncology prior to treatment.  It showed 3-dimensional radiation distributions overlaid onto the planning CT/MRI image set.  The [facility] for the target structures as well as the organs at risk were reviewed. The documentation of the 3D plan and dosimetry are filed in the radiation oncology EMR.  NARRATIVE:  Ilia Engelbert was brought to the TrueBeam stereotactic radiation treatment machine and placed supine on the CT couch. The head frame was applied, and the patient was set up for stereotactic radiosurgery.  Neurosurgery was present for the set-up and delivery  SIMULATION VERIFICATION:  In the couch zero-angle position, the patient underwent Exactrac imaging using the Brainlab system with orthogonal KV images.  These were carefully aligned and repeated to confirm treatment position for each of the isocenters.  The Exactrac snap film verification was repeated at each couch angle.  PROCEDURE: [name] received stereotactic radiosurgery to the following targets:  6 mm Right cerebellar target was treated using 3 Dynamic Conformal Arcs to a prescription dose of 20 Gy.  ExacTrac registration was performed for each couch angle.  The 80.6% isodose line was prescribed.  6 MV X-rays were delivered in the flattening filter free beam mode.   STEREOTACTIC TREATMENT MANAGEMENT:  Following delivery, the patient was transported to nursing in stable condition and monitored for possible acute effects.  Vital signs were recorded BP (!) 156/103 (BP Location: Left  Arm, Patient Position: Sitting, Cuff Size: Normal)   Pulse 75   Temp 97.5 F (36.4 C) (Oral)   Resp 18   SpO2 100% . The patient tolerated treatment without significant acute effects, and was discharged to home in stable condition.    PLAN: Follow-up in one month.  ________________________________  Sheral Apley. Tammi Klippel, M.D.  This document serves as a record of services personally performed by Tyler Pita, MD. It was created on his behalf by Arlyce Harman, a trained medical scribe. The creation of this record is based on the scribe's personal observations and the provider's statements to them. This document has been checked and approved by the attending provider.

## 2016-01-27 NOTE — Addendum Note (Signed)
Encounter addended by: Doreen Beam, RN on: 01/27/2016  1:40 PM<BR>    Actions taken: Vitals modified, Sign clinical note

## 2016-02-02 ENCOUNTER — Encounter: Payer: Self-pay | Admitting: Hematology

## 2016-02-02 ENCOUNTER — Ambulatory Visit (HOSPITAL_BASED_OUTPATIENT_CLINIC_OR_DEPARTMENT_OTHER): Payer: Medicare Other

## 2016-02-02 ENCOUNTER — Other Ambulatory Visit (HOSPITAL_BASED_OUTPATIENT_CLINIC_OR_DEPARTMENT_OTHER): Payer: Medicare Other

## 2016-02-02 ENCOUNTER — Ambulatory Visit (HOSPITAL_BASED_OUTPATIENT_CLINIC_OR_DEPARTMENT_OTHER): Payer: Medicare Other | Admitting: Hematology

## 2016-02-02 ENCOUNTER — Telehealth: Payer: Self-pay | Admitting: *Deleted

## 2016-02-02 VITALS — BP 145/104 | HR 77 | Temp 98.2°F | Resp 16

## 2016-02-02 DIAGNOSIS — Z8673 Personal history of transient ischemic attack (TIA), and cerebral infarction without residual deficits: Secondary | ICD-10-CM | POA: Diagnosis not present

## 2016-02-02 DIAGNOSIS — Z5112 Encounter for antineoplastic immunotherapy: Secondary | ICD-10-CM | POA: Diagnosis present

## 2016-02-02 DIAGNOSIS — C799 Secondary malignant neoplasm of unspecified site: Secondary | ICD-10-CM

## 2016-02-02 DIAGNOSIS — R5383 Other fatigue: Secondary | ICD-10-CM

## 2016-02-02 DIAGNOSIS — C770 Secondary and unspecified malignant neoplasm of lymph nodes of head, face and neck: Secondary | ICD-10-CM

## 2016-02-02 DIAGNOSIS — C7931 Secondary malignant neoplasm of brain: Secondary | ICD-10-CM | POA: Diagnosis not present

## 2016-02-02 DIAGNOSIS — I619 Nontraumatic intracerebral hemorrhage, unspecified: Secondary | ICD-10-CM

## 2016-02-02 DIAGNOSIS — C7800 Secondary malignant neoplasm of unspecified lung: Secondary | ICD-10-CM

## 2016-02-02 DIAGNOSIS — I10 Essential (primary) hypertension: Secondary | ICD-10-CM

## 2016-02-02 DIAGNOSIS — C439 Malignant melanoma of skin, unspecified: Secondary | ICD-10-CM | POA: Diagnosis present

## 2016-02-02 DIAGNOSIS — Z79899 Other long term (current) drug therapy: Secondary | ICD-10-CM

## 2016-02-02 DIAGNOSIS — E039 Hypothyroidism, unspecified: Secondary | ICD-10-CM | POA: Diagnosis not present

## 2016-02-02 LAB — COMPREHENSIVE METABOLIC PANEL
ALBUMIN: 3.6 g/dL (ref 3.5–5.0)
ALK PHOS: 73 U/L (ref 40–150)
ALT: 17 U/L (ref 0–55)
ANION GAP: 8 meq/L (ref 3–11)
AST: 20 U/L (ref 5–34)
BUN: 24.1 mg/dL (ref 7.0–26.0)
CALCIUM: 9.2 mg/dL (ref 8.4–10.4)
CHLORIDE: 108 meq/L (ref 98–109)
CO2: 23 mEq/L (ref 22–29)
CREATININE: 1.1 mg/dL (ref 0.7–1.3)
EGFR: 63 mL/min/{1.73_m2} — ABNORMAL LOW (ref >90)
Glucose: 94 mg/dl (ref 70–140)
POTASSIUM: 4.8 meq/L (ref 3.5–5.1)
Sodium: 140 mEq/L (ref 136–145)
Total Bilirubin: 0.45 mg/dL (ref 0.20–1.20)
Total Protein: 6.7 g/dL (ref 6.4–8.3)

## 2016-02-02 LAB — CBC WITH DIFFERENTIAL/PLATELET
BASO%: 0.7 % (ref 0.0–2.0)
BASOS ABS: 0 10*3/uL (ref 0.0–0.1)
EOS ABS: 0.2 10*3/uL (ref 0.0–0.5)
EOS%: 4.7 % (ref 0.0–7.0)
HEMATOCRIT: 45.8 % (ref 38.4–49.9)
HEMOGLOBIN: 15.2 g/dL (ref 13.0–17.1)
LYMPH#: 0.4 10*3/uL — AB (ref 0.9–3.3)
LYMPH%: 8.3 % — ABNORMAL LOW (ref 14.0–49.0)
MCH: 30.4 pg (ref 27.2–33.4)
MCHC: 33.2 g/dL (ref 32.0–36.0)
MCV: 91.6 fL (ref 79.3–98.0)
MONO#: 0.6 10*3/uL (ref 0.1–0.9)
MONO%: 11.8 % (ref 0.0–14.0)
NEUT#: 3.9 10*3/uL (ref 1.5–6.5)
NEUT%: 74.5 % (ref 39.0–75.0)
PLATELETS: 170 10*3/uL (ref 140–400)
RBC: 5 10*6/uL (ref 4.20–5.82)
RDW: 14.8 % — ABNORMAL HIGH (ref 11.0–14.6)
WBC: 5.3 10*3/uL (ref 4.0–10.3)

## 2016-02-02 LAB — TSH: TSH: 5.671 m(IU)/L — ABNORMAL HIGH (ref 0.320–4.118)

## 2016-02-02 MED ORDER — SODIUM CHLORIDE 0.9 % IV SOLN
240.0000 mg | Freq: Once | INTRAVENOUS | Status: AC
  Administered 2016-02-02: 240 mg via INTRAVENOUS
  Filled 2016-02-02: qty 20

## 2016-02-02 MED ORDER — SODIUM CHLORIDE 0.9 % IV SOLN
Freq: Once | INTRAVENOUS | Status: AC
  Administered 2016-02-02: 11:00:00 via INTRAVENOUS

## 2016-02-02 NOTE — Progress Notes (Signed)
[place]  Telephone:(336) [phone] [phone]) [phone]  Clinic Follow up Note   Patient Care Team: [name], MD as PCP - General (Internal Medicine) 02/02/2016  Chief complaint: Follow-up metastatic melanoma  Oncology History   Metastatic melanoma [facility])   Staging form: Melanoma of the Skin, AJCC 7th Edition     Clinical stage from 09/21/2015: Stage IV [place], [place], [place]) - Signed by [name], MD on 10/09/2015       Metastatic melanoma ([place])   09/16/2015 Imaging    PET scan showed a hypermetabolic 8.0KL lingular nodule, and a 27m right upper lobe lung nodule, left subclavicular nodes, and bone metastasis in the proximal right humerus and right femur.       09/21/2015 Initial Diagnosis    Metastatic melanoma ([place]      09/21/2015 Initial Biopsy    Left subclavicular lymph node biopsy showed prostatic of melanoma      09/21/2015 Miscellaneous    BRAF V600K mutation (+)       10/04/2015 Imaging    CT head with without contrast showed a 2.0 x 1.6 x 1.7 cm superficial left frontal hyperdense lesion, with postcontrast enhancement, lying within the previous hemorrhagic area, concerning for solitary melanoma metastasis      10/06/2015 -  Chemotherapy    Nivolumab 2472mevery 2 weeks       10/13/2015 - 10/13/2015 Radiation Therapy    SRS to his left frontal solitary brain metastasis       10/15/2015 Surgery    left front craniotomy and tumor excision       10/15/2015 Pathology Results    left frontal brain mass excision showed metastatic melanoma, 1.5X1.5X0.6cm      01/13/2016 Imaging    CT head without and with contrast showed ill-defined enhancement and dural thickening of the left frontal resection cavity may represent a post treatment changes or residual neoplasm. A new 6 mm hypodensity in the right cerebellar hemisphere with uncertainty enhancement may represent a metastasis or small brain parenchymal hemorrhage.       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 He was found to have left frontal hemorrhage 5.7 cm x 4.5 cm with 6 mm left to right shift.During the workup for his stroke, a left upper lung mass was noticed on the CT scan. I was called to evaluate his lung mass.  Patient has aphasia, not able to communicate much. According to his daughter, Dr. LeGala Romneywho is a OB GYN at [facility] [facility] [place] probably only smoked for very short period time during the college. He did have 3 skin melanoma, which was removed, last one was 2-3 years ago. No other cancer history.  CURRENT THERAPY: Nivolumab 240 mg, every 2 weeks, started on 10/06/2015  INTERVAL HISTORY: Mr. HaArcheturns for follow-up and nivolumab therapy. His Surveillance CT scan showed a new small lesion in the right cerebellum and he received SRS last week. He denies any headaches, or other neurological symptoms. He still has moderate fatigue, has not played golf daily, and has not gone to gym lately. He started drinking wine once a day a few weeks ago. He otherwise feels well, able to function well at home. Denies any significant side effects from  a Nivolumab infusion.  REVIEW OF SYSTEMS:  Constitutional: Denies fevers, chills or abnormal weight loss, (+) mild fatigue Eyes: Denies blurriness of vision Ears, nose, mouth, throat, and face: Denies mucositis or sore throat Respiratory: Denies cough, dyspnea or wheezes Cardiovascular: Denies palpitation, chest discomfort or lower extremity swelling Gastrointestinal:  Denies nausea, heartburn or change in bowel habits Skin: Denies abnormal skin rashes Lymphatics: Denies new lymphadenopathy or easy bruising Neurological:see HPI  Behavioral/Psych: Mood is stable, no new changes  All other systems were reviewed with the patient and  are negative.  MEDICAL HISTORY:  Past Medical History:  Diagnosis Date  . Anginal pain ([place])   . Arthritis    mostly hands  . Benign familial hematuria   . BPH (benign prostatic hypertrophy)   . Brain cancer ([place])    melanoma with brain met  . Coronary artery disease   . GERD (gastroesophageal reflux disease)   . High cholesterol   . Hypertension   . Hypothyroidism   . Intracerebral hemorrhage ([place])   . Melanoma ([place])   . Metastatic melanoma to lung ([place])   . Mood disorder ([place])   . Myocardial infarction   . Pacemaker    due to syncope, 3rd degree HB (upgrade to [employer]. Jude CRT-P 02/25/13 (Dr. Uvaldo Rising)  . Rectal bleed    due to NSAIDS  . Renal disorder   . Skin cancer    melanoma  . Sleep apnea    uses CPAP  . Stroke [facility])     SURGICAL HISTORY: Past Surgical History:  Procedure Laterality Date  . APPLICATION OF CRANIAL NAVIGATION N/A 10/15/2015   Procedure: APPLICATION OF CRANIAL NAVIGATION;  Surgeon: Erline Levine, MD;  Location: [place] NEURO ORS;  Service: Neurosurgery;  Laterality: N/A;  . CARDIAC CATHETERIZATION  2013  . CARDIAC SURGERY     bypass X 2  . CORONARY ARTERY BYPASS GRAFT  2013   LIMA-LAD, SVG-PDA 03/27/11 (Dr. Francee Gentile)  . CRANIOTOMY N/A 10/15/2015   Procedure: LEFT FRONTAL CRANIOTOMY TUMOR EXCISION with Curve;  Surgeon: Erline Levine, MD;  Location: [place] NEURO ORS;  Service: Neurosurgery;  Laterality: N/A;  CRANIOTOMY TUMOR EXCISION  . EYE SURGERY    . FOOT SURGERY  08/2010  . JOINT REPLACEMENT Left    partial knee  . KNEE ARTHROSCOPY  12/2008  . LYMPH NODE BIOPSY    . PACEMAKER INSERTION    . TONSILLECTOMY      I have reviewed the social history and family history with the patient and they are unchanged from previous note.  ALLERGIES:  is allergic to zocor [simvastatin].  MEDICATIONS:  Current Outpatient Prescriptions  Medication Sig Dispense Refill  . acetaminophen (TYLENOL) 325 MG tablet Take 2 tablets (650 mg total) by mouth every 6 (six) hours as  needed for mild pain. (Patient taking differently: Take 650 mg by mouth 3 (three) times daily. )    . carvedilol (COREG) 3.125 MG tablet Take 3.125 mg by mouth 2 (two) times daily with a meal.    . levothyroxine (SYNTHROID, LEVOTHROID) 125 MCG tablet TAKE 1 TABLET BY MOUTH EVERY MORNING BEFORE BREAKFAST (Patient taking differently: TAKE 1 TABLET (100 mg) BY MOUTH EVERY MORNING BEFORE BREAKFAST) 30 tablet 11  . Multiple Vitamin (MULTIVITAMIN WITH MINERALS) TABS tablet Take 1 tablet by mouth daily.    Marland Kitchen PRESCRIPTION MEDICATION Inhale into the lungs at bedtime. CPAP    . rosuvastatin (CRESTOR) 10 MG tablet Take 10 mg by mouth at bedtime.    . senna-docusate (SENOKOT-S) 8.6-50  MG tablet Take 1 tablet by mouth 2 (two) times daily.    . sertraline (ZOLOFT) 50 MG tablet Take 2.5 tablets (125 mg total) by mouth daily. (Patient taking differently: Take 100 mg by mouth daily. )    . Turmeric Curcumin 500 MG CAPS Take 500 mg by mouth 2 (two) times daily.     No current facility-administered medications for this visit.    Facility-Administered Medications Ordered in Other Visits  Medication Dose Route Frequency Provider Last Rate Last Dose  . 0.9 %  sodium chloride infusion   Intravenous Once [name], MD      . nivolumab (OPDIVO) 240 mg in sodium chloride 0.9 % 100 mL chemo infusion  240 mg Intravenous Once [name], MD   240 mg at 02/02/16 1044    PHYSICAL EXAMINATION: ECOG PERFORMANCE STATUS: 2 Blood pressure 145/104, heart rate 75, respiratory rate 18, temperature 98.2, pulse ox 100%. GENERAL:alert, no distress and comfortable SKIN: skin color, texture, turgor are normal, no rashes or significant lesions EYES: normal, Conjunctiva are pink and non-injected, sclera clear OROPHARYNX:no exudate, no erythema and lips, buccal mucosa, and tongue normal  NECK: supple, thyroid normal size, non-tender, without nodularity LYMPH:  no palpable lymphadenopathy in the cervical, [place], axillary or inguinal LUNGS:  clear to auscultation and percussion with normal breathing effort HEART: regular rate & rhythm and no murmurs and no lower extremity edema ABDOMEN:abdomen soft, non-tender and normal bowel sounds Musculoskeletal:no cyanosis of digits and no clubbing  NEURO: alert & oriented x 3 with fluent speech, no focal motor/sensory deficits, he walks with a cane independently, although his gait is slightly wobbly.   LABORATORY DATA:  I have reviewed the data as listed CBC Latest Ref Rng & Units 02/02/2016 01/19/2016 01/05/2016  WBC 4.0 - 10.3 10e3/uL 5.3 4.3 4.7  Hemoglobin 13.0 - 17.1 g/dL 15.2 14.4 14.3  Hematocrit 38.4 - 49.9 % 45.8 42.5 43.1  Platelets 140 - 400 10e3/uL 170 150 192     CMP Latest Ref Rng & Units 02/02/2016 01/19/2016 01/05/2016  Glucose 70 - 140 mg/dl 94 134 96  BUN 7.0 - 26.0 mg/dL 24.1 23.0 20.7  Creatinine 0.7 - 1.3 mg/dL 1.1 1.1 1.0  Sodium 136 - 145 mEq/L 140 139 139  Potassium 3.5 - 5.1 mEq/L 4.8 4.6 4.9  Chloride 101 - 111 mmol/L - - -  CO2 22 - 29 mEq/L 23 22 21(L)  Calcium 8.4 - 10.4 mg/dL 9.2 9.2 9.4  Total Protein 6.4 - 8.3 g/dL 6.7 6.6 6.6  Total Bilirubin 0.20 - 1.20 mg/dL 0.45 0.42 0.46  Alkaline Phos 40 - 150 U/L 73 67 69  AST 5 - 34 U/L _0 ALT 0 - 55 U/L _1 TSH  Order: [id number]  Status:  Final result Visible to patient:  No (Not Released) Next appt:  02/16/2016 at 11:00 AM in Oncology ([facility]) Dx:  Metastatic melanoma ([place])    Ref Range & Units 09:20 2wk ago   TSH 0.320 - 4.118 m(IU)/L 5.671   2.292         PATHOLOGY REPORT: Diagnosis 09/21/2015 Lymph node, needle/core biopsy, hypermetabolic left sub clavicular LN - METASTATIC MALIGNANT MELANOMA. - SEE COMMENT. Microscopic Comment The case was discussed with Dr. Alen Blew on 09/22/15. Additional studies can be performed upon clinician request. (JBK:gt, 09/22/15)  Diagnosis 10/15/2015 Brain, for tumor resection, Left frontal - METASTATIC MELANOMA. Microscopic Comment The  tumor is a high grade malignancy with frequent  mitotic figures and focal intracytoplasmic pigment. Immunohistochemistry shows strong positivity with S-100, HMB45 and Melan-A. (JDP:ecj 10/18/2015)      RADIOGRAPHIC STUDIES: I have personally reviewed the radiological images as listed and agreed with the findings in the report.  PET 12/23/2015 IMPRESSION: Improved PET-CT when compared to prior study. Some lesions have resolved and other lesions are smaller. This would suggest a good response to therapy. No new lesions/disease.   CT head w wo contrast 01/13/2016 IMPRESSION: 1. Ill-defined enhancement and dural thickening of the left frontal resection cavity may represent posttreatment changes or residual/recurrent neoplasm. Follow-up is recommended. 2. New 6 mm hyperdensity in right cerebellar hemisphere with uncertain enhancement may represent a metastasis or small brain parenchymal hemorrhage. 3. Mild interval dilatation of the lateral and third ventricles and resolution of left-to-right midline shift probably due to resolving left hemisphere edema and ex vacuo dilatation. These results will be called to the ordering clinician or representative by the Radiologist Assistant, and communication documented in the PACS or zVision Dashboard.   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   1. Metastatic melanoma to lung, node, bone and brain, BRAF V600K mutation (+) -I previously  reviewed his PET/CT scan images with pt and his family members. The scan revealed hypermetabolic left upper lobe lung mass, 2 small right lung nodules, probable bone metastasis, and a subcutaneous left supraclavicular lesion, which is not palpable  -His repeat his CT had showed a 2.0 cm left frontal hyperdense lesion with enhancement on contrast, in the area of his  recent hemorrhagic stroke, highly suspicious for brain metastasis.  -We discussed his left supraclavicular node biopsy results, which showed metastatic melanoma.. -His case was discussed in our sarcoma tumor   -I recommend first-line treatment with Nivolumab alone, given his recent stroke, low tumor burden. I'll reserve BRAF inhibitor with or without MEK inhibitor as a second line therapy if he progress on immunotherapy. Other options of immunotherapy, especially checkpoint inhibitor with ipilumimab, were also discussed with pt and his daughter. We reviewed his restaging PET scan images in person with patient and his daughter, he had excellent partial response, especially the lung lesion and subcutaneous nodules. No other new lesions. -We will continue Nivolumab every 2 weeks  -He has been tolerating Nivolumab well, no significant side effects except mild fatigue  -We reviewed the duration of nivolumab treatment, and alternative therapy options, especially with disease progression  -we reviewed the benefit and risks of adding Ipilumumab. Given his advanced age, fatigue, and his preference of quality of life, I will hold on ipi for now, but will consider if he has significant CNS progression. He did have a small new brain lesion on recent scan which was treated with SRS, will follow up closely   -Lab results reviewed, adequate for treatment, we'll continue nivo  -will consider adding Xgeva in the next few months for his bone mets   2. Hemorrhagic stroke and brain metastasis  -I encouraged him to continue physical therapy and occupational therapy. He has recovered  very well -History post SBRT and surgical resection of his solitary brain metastasis  -He will continue following with Dr. Vertell Limber and Dr. Tammi Klippel  3. Fatigue -his fatigue has much improved after he restarted testosterone injection -I encouraged him to gradually increase his activity and exercise  4. Hypothyroidism  -will continue  monitoring his TSH, his PCP recommends him to continue current  synthroid dose  -his TSH has significantly decreased 2 weeks ago, today's result is still pending -he sees his primary care physician Dr. Asher Muir every 2 weeks, who will adjust his Synthroid as needed   5. HTN -his BP has been high lately both at home and here  -His lisinopril was held during his stroke. I recommend him to resume lisinopril  -He will follow-up with his primary care physician  Plan -We'll proceed with Nivolumab treatment today and continue every 2 weeks  -Restart lisinopril. -I'll see him back in 4 weeks, we'll start Xgeva on next visit.   All questions were answered. The patient knows to call the clinic with any problems, questions or concerns. No barriers to learning was detected.  I spent 30 minutes counseling the patient face to face. The total time spent in the appointment was 35 minutes and more than 50% was on counseling and review of test results     [name], MD 02/02/16

## 2016-02-02 NOTE — Telephone Encounter (Signed)
Spoke with patient.  Let him know that blood pressure today was 145/104 and that Dr. Burr Medico wants him to restart his blood pressure medication, lisinopril. She also wants him to call his PCP about this.   Patient states he will call his PCP and he already has lisinopril there at home so he is able to restart it.

## 2016-02-02 NOTE — Patient Instructions (Signed)
[facility] Discharge Instructions for Patients Receiving Chemotherapy  Today you received the following chemotherapy agents:  Nivolumab.  To help prevent nausea and vomiting after your treatment, we encourage you to take your nausea medication as directed.   If you develop nausea and vomiting that is not controlled by your nausea medication, call the clinic.   BELOW ARE SYMPTOMS THAT SHOULD BE REPORTED IMMEDIATELY:  *FEVER GREATER THAN 100.5 F  *CHILLS WITH OR WITHOUT FEVER  NAUSEA AND VOMITING THAT IS NOT CONTROLLED WITH YOUR NAUSEA MEDICATION  *UNUSUAL SHORTNESS OF BREATH  *UNUSUAL BRUISING OR BLEEDING  TENDERNESS IN MOUTH AND THROAT WITH OR WITHOUT PRESENCE OF ULCERS  *URINARY PROBLEMS  *BOWEL PROBLEMS  UNUSUAL RASH Items with * indicate a potential emergency and should be followed up as soon as possible.  Feel free to call the clinic you have any questions or concerns. The clinic phone number is [phone].  Please show the CHEMO ALERT CARD at check-in to the Emergency Department and triage nurse.   

## 2016-02-06 NOTE — Progress Notes (Signed)
  Radiation Oncology         [phone]) [phone] ________________________________  Name: David [name] MRN: [id number]  Date: 01/27/2016  DOB: 11-24-1940  End of Treatment Note  Diagnosis:    75 yo man with a new 6 mm right cerebellar brain met from melanoma     Indication for treatment:  Palliation       Radiation treatment dates:   01/27/16  Site/dose/beams/energy:   6 mm Right cerebellar target was treated using 3 Dynamic Conformal Arcs to a prescription dose of 20 Gy.  ExacTrac registration was performed for each couch angle.  The 80.6% isodose line was prescribed.  6 MV X-rays were delivered in the flattening filter free beam mode.  Narrative: The patient tolerated radiation treatment relatively well.     Plan: The patient has completed radiation treatment. The patient will return to radiation oncology clinic for routine followup in one month. I advised him to call or return sooner if he has any questions or concerns related to his recovery or treatment. ________________________________  Sheral Apley. Tammi Klippel, M.D.

## 2016-02-16 ENCOUNTER — Ambulatory Visit (HOSPITAL_BASED_OUTPATIENT_CLINIC_OR_DEPARTMENT_OTHER): Payer: Medicare Other

## 2016-02-16 ENCOUNTER — Other Ambulatory Visit: Payer: Self-pay | Admitting: Hematology

## 2016-02-16 ENCOUNTER — Other Ambulatory Visit (HOSPITAL_BASED_OUTPATIENT_CLINIC_OR_DEPARTMENT_OTHER): Payer: Medicare Other

## 2016-02-16 VITALS — BP 134/89 | HR 75 | Resp 18

## 2016-02-16 DIAGNOSIS — C7931 Secondary malignant neoplasm of brain: Secondary | ICD-10-CM

## 2016-02-16 DIAGNOSIS — C439 Malignant melanoma of skin, unspecified: Secondary | ICD-10-CM

## 2016-02-16 DIAGNOSIS — C770 Secondary and unspecified malignant neoplasm of lymph nodes of head, face and neck: Secondary | ICD-10-CM

## 2016-02-16 DIAGNOSIS — C799 Secondary malignant neoplasm of unspecified site: Secondary | ICD-10-CM

## 2016-02-16 DIAGNOSIS — Z5112 Encounter for antineoplastic immunotherapy: Secondary | ICD-10-CM

## 2016-02-16 DIAGNOSIS — Z79899 Other long term (current) drug therapy: Secondary | ICD-10-CM | POA: Diagnosis not present

## 2016-02-16 DIAGNOSIS — C7800 Secondary malignant neoplasm of unspecified lung: Secondary | ICD-10-CM

## 2016-02-16 LAB — CBC WITH DIFFERENTIAL/PLATELET
BASO%: 0.5 % (ref 0.0–2.0)
Basophils Absolute: 0 10*3/uL (ref 0.0–0.1)
EOS%: 3.6 % (ref 0.0–7.0)
Eosinophils Absolute: 0.2 10*3/uL (ref 0.0–0.5)
HCT: 47.1 % (ref 38.4–49.9)
HGB: 15.6 g/dL (ref 13.0–17.1)
LYMPH%: 9 % — ABNORMAL LOW (ref 14.0–49.0)
MCH: 30.2 pg (ref 27.2–33.4)
MCHC: 33.1 g/dL (ref 32.0–36.0)
MCV: 91.3 fL (ref 79.3–98.0)
MONO#: 0.6 10*3/uL (ref 0.1–0.9)
MONO%: 11.1 % (ref 0.0–14.0)
NEUT#: 4.1 10*3/uL (ref 1.5–6.5)
NEUT%: 75.8 % — ABNORMAL HIGH (ref 39.0–75.0)
Platelets: 151 10*3/uL (ref 140–400)
RBC: 5.16 10*6/uL (ref 4.20–5.82)
RDW: 14.7 % — ABNORMAL HIGH (ref 11.0–14.6)
WBC: 5.4 10*3/uL (ref 4.0–10.3)
lymph#: 0.5 10*3/uL — ABNORMAL LOW (ref 0.9–3.3)

## 2016-02-16 LAB — COMPREHENSIVE METABOLIC PANEL
ALT: 17 U/L (ref 0–55)
AST: 23 U/L (ref 5–34)
Albumin: 3.5 g/dL (ref 3.5–5.0)
Alkaline Phosphatase: 76 U/L (ref 40–150)
Anion Gap: 9 mEq/L (ref 3–11)
BUN: 29.7 mg/dL — ABNORMAL HIGH (ref 7.0–26.0)
CO2: 20 mEq/L — ABNORMAL LOW (ref 22–29)
Calcium: 9.3 mg/dL (ref 8.4–10.4)
Chloride: 108 mEq/L (ref 98–109)
Creatinine: 1.4 mg/dL — ABNORMAL HIGH (ref 0.7–1.3)
EGFR: 47 mL/min/{1.73_m2} — ABNORMAL LOW (ref >90)
Glucose: 96 mg/dl (ref 70–140)
Potassium: 5.5 mEq/L — ABNORMAL HIGH (ref 3.5–5.1)
Sodium: 137 mEq/L (ref 136–145)
Total Bilirubin: 0.4 mg/dL (ref 0.20–1.20)
Total Protein: 6.9 g/dL (ref 6.4–8.3)

## 2016-02-16 LAB — TSH: TSH: 11.911 m(IU)/L — ABNORMAL HIGH (ref 0.320–4.118)

## 2016-02-16 MED ORDER — SODIUM CHLORIDE 0.9 % IV SOLN
240.0000 mg | Freq: Once | INTRAVENOUS | Status: AC
  Administered 2016-02-16: 240 mg via INTRAVENOUS
  Filled 2016-02-16: qty 20

## 2016-02-16 MED ORDER — SODIUM CHLORIDE 0.9 % IV SOLN
Freq: Once | INTRAVENOUS | Status: AC
  Administered 2016-02-16: 12:00:00 via INTRAVENOUS

## 2016-02-16 NOTE — Patient Instructions (Signed)
[facility] Discharge Instructions for Patients Receiving Chemotherapy  Today you received the following chemotherapy agents Nivolumab  To help prevent nausea and vomiting after your treatment, we encourage you to take your nausea medication     If you develop nausea and vomiting that is not controlled by your nausea medication, call the clinic.   BELOW ARE SYMPTOMS THAT SHOULD BE REPORTED IMMEDIATELY:  *FEVER GREATER THAN 100.5 F  *CHILLS WITH OR WITHOUT FEVER  NAUSEA AND VOMITING THAT IS NOT CONTROLLED WITH YOUR NAUSEA MEDICATION  *UNUSUAL SHORTNESS OF BREATH  *UNUSUAL BRUISING OR BLEEDING  TENDERNESS IN MOUTH AND THROAT WITH OR WITHOUT PRESENCE OF ULCERS  *URINARY PROBLEMS  *BOWEL PROBLEMS  UNUSUAL RASH Items with * indicate a potential emergency and should be followed up as soon as possible.  Feel free to call the clinic you have any questions or concerns. The clinic phone number is [phone].  Please show the CHEMO ALERT CARD at check-in to the Emergency Department and triage nurse.   

## 2016-02-28 ENCOUNTER — Ambulatory Visit
Admission: RE | Admit: 2016-02-28 | Discharge: 2016-02-28 | Disposition: A | Discharge status: Home / Self Care | Payer: Medicare Other | Source: Ambulatory Visit | Attending: Radiation Oncology | Admitting: Radiation Oncology

## 2016-02-28 ENCOUNTER — Encounter: Payer: Self-pay | Admitting: Radiation Oncology

## 2016-02-28 VITALS — BP 140/99 | HR 77 | Resp 16 | Wt 203.2 lb

## 2016-02-28 DIAGNOSIS — C799 Secondary malignant neoplasm of unspecified site: Secondary | ICD-10-CM

## 2016-02-28 DIAGNOSIS — Z888 Allergy status to other drugs, medicaments and biological substances status: Secondary | ICD-10-CM | POA: Insufficient documentation

## 2016-02-28 DIAGNOSIS — R21 Rash and other nonspecific skin eruption: Secondary | ICD-10-CM | POA: Insufficient documentation

## 2016-02-28 DIAGNOSIS — Z79899 Other long term (current) drug therapy: Secondary | ICD-10-CM | POA: Insufficient documentation

## 2016-02-28 DIAGNOSIS — C439 Malignant melanoma of skin, unspecified: Secondary | ICD-10-CM | POA: Insufficient documentation

## 2016-02-28 DIAGNOSIS — C7931 Secondary malignant neoplasm of brain: Secondary | ICD-10-CM

## 2016-02-28 NOTE — Addendum Note (Signed)
Encounter addended by: Heywood Footman, RN on: 02/28/2016  4:44 PM<BR>    Actions taken: Charge Capture section accepted

## 2016-02-28 NOTE — Progress Notes (Signed)
Radiation Oncology         [phone]) [phone] ________________________________  Name: David [name] MRN: [id number]  Date: 02/28/2016  DOB: 06-13-1940  Follow-Up Visit Note  CC: [name], MD  [name], MD  Diagnosis:   75 yo man with a new 6 mm right cerebellar brain met from melanoma    ICD-9-CM ICD-10-CM   1. Metastatic melanoma (HCC) 172.9 C79.9   2. Metastasis to brain (HCC) 198.3 C79.31     Interval Since Last Radiation: 1 month  01/27/16 SRS Treatment:  20 Gy in 1 fraction to a 6 mm right cerebellar brain met   10/13/2015 Preop SRS treatment: 14 Gy  to the left frontal lesion in 1 fraction  Narrative:  The patient returns today for routine follow-up. He continues on nivolimab with Dr. Burr Medico for treatment of his metastatic melanoma. Overall he   denies pain, headaches, nausea, or vomiting, abdominal pain, bowel or bladder dysfunction. He denies visual or auditory changes, or progressive headaches. He does describe intermittent headaches and a sense of an out of body sensation. His complaints are that he has dizziness that leaves him "feeling off center at times". He also denies falls. No other complaints are noted.                        ALLERGIES:  is allergic to zocor [simvastatin].  Meds: Current Outpatient Prescriptions  Medication Sig Dispense Refill  . acetaminophen (TYLENOL) 325 MG tablet Take 2 tablets (650 mg total) by mouth every 6 (six) hours as needed for mild pain. (Patient taking differently: Take 650 mg by mouth 3 (three) times daily. )    . carvedilol (COREG) 6.25 MG tablet TK 1 T PO  BID  0  . levothyroxine (SYNTHROID, LEVOTHROID) 125 MCG tablet TAKE 1 TABLET BY MOUTH EVERY MORNING BEFORE BREAKFAST (Patient taking differently: TAKE 1 TABLET (100 mg) BY MOUTH EVERY MORNING BEFORE BREAKFAST) 30 tablet 11  . Multiple Vitamin (MULTIVITAMIN WITH MINERALS) TABS tablet Take 1 tablet by mouth daily.    Marland Kitchen PRESCRIPTION MEDICATION Inhale into the lungs at bedtime.  CPAP    . PREVNAR 13 SUSP injection ADM 0.5ML IM UTD  0  . rosuvastatin (CRESTOR) 10 MG tablet Take 10 mg by mouth at bedtime.    . senna-docusate (SENOKOT-S) 8.6-50 MG tablet Take 1 tablet by mouth 2 (two) times daily.    . sertraline (ZOLOFT) 50 MG tablet Take 2.5 tablets (125 mg total) by mouth daily. (Patient taking differently: Take 100 mg by mouth daily. )    . Turmeric Curcumin 500 MG CAPS Take 500 mg by mouth 2 (two) times daily.     No current facility-administered medications for this encounter.     Physical Findings:   weight is 203 lb 3.2 oz (92.2 kg). His blood pressure is 140/99 (abnormal) and his pulse is 77. His respiration is 16 and oxygen saturation is 100%. .  No significant changes. In general this is a well appearing caucasian male in no acute distress. He's alert and oriented x4 and appropriate throughout the examination. Cardiopulmonary assessment is negative for acute distress and he exhibits normal effort.  Patient has erythematous nose bridge and nasal labia folds. This is without infection or cellulitis. Maculopapular rash also noted of bilateral upper extremities associated with immunotherapy.  Lab Findings: Lab Results  Component Value Date   WBC 5.4 02/16/2016   WBC 5.9 10/13/2015   HGB 15.6 02/16/2016   HCT 47.1  02/16/2016   PLT 151 02/16/2016    Lab Results  Component Value Date   NA 137 02/16/2016   K 5.5 Repeated and Verified, no hemolysis (H) 02/16/2016   CHLORIDE 108 02/16/2016   CO2 20 (L) 02/16/2016   GLUCOSE 96 02/16/2016   BUN 29.7 (H) 02/16/2016   CREATININE 1.4 (H) 02/16/2016   BILITOT 0.40 02/16/2016   ALKPHOS 76 02/16/2016   AST 23 02/16/2016   ALT 17 02/16/2016   PROT 6.9 02/16/2016   ALBUMIN 3.5 02/16/2016   CALCIUM 9.3 02/16/2016   ANIONGAP 9 02/16/2016   ANIONGAP 8 10/13/2015    Radiographic Findings: No results found.  Impression/Plan: 1. Metastatic Melanoma. We will plan to schedule imaging 3 months post treatment  with MRI. We will then plan to see him back to review results from multidisciplinary clinic. He will continue immunotherapy with Dr. Burr Medico in the meantime as well. 2. Rash. This appears to be due to his immunotherapy, we will continue to follow this as he is receiving treatment from Dr. Burr Medico.      Carola Rhine, [facility] Seen with and dictated for Sheral Apley. Tammi Klippel, MD   This document serves as a record of services personally performed by Tyler Pita, MD. It was created on his behalf by Bethann Humble, a trained medical scribe. The creation of this record is based on the scribe's personal observations and the provider's statements to them. This document has been checked and approved by the attending provider.

## 2016-02-28 NOTE — Progress Notes (Signed)
BP slightly elevated. Weight stable. Denies pain. Denies headaches, nausea or vomiting. Denies visual or auditory disturbances. Reports unchanged dizziness that leaves him "feeling off center at times." Denies any falls. Next chemo infusion scheduled for 03/06/16.   BP (!) 140/99 (BP Location: Left Arm, Patient Position: Sitting, Cuff Size: Normal)   Pulse 77   Resp 16   Wt 203 lb 3.2 oz (92.2 kg)   SpO2 100%   BMI 30.01 kg/m  Wt Readings from Last 3 Encounters:  02/28/16 203 lb 3.2 oz (92.2 kg)  01/19/16 202 lb 6.4 oz (91.8 kg)  01/05/16 200 lb 9.6 oz (91 kg)

## 2016-03-01 ENCOUNTER — Ambulatory Visit: Payer: Medicare Other | Admitting: Hematology

## 2016-03-01 ENCOUNTER — Ambulatory Visit: Payer: Medicare Other

## 2016-03-01 ENCOUNTER — Other Ambulatory Visit: Payer: Medicare Other

## 2016-03-06 ENCOUNTER — Ambulatory Visit: Payer: Medicare Other

## 2016-03-06 ENCOUNTER — Ambulatory Visit (HOSPITAL_BASED_OUTPATIENT_CLINIC_OR_DEPARTMENT_OTHER): Payer: Medicare Other

## 2016-03-06 ENCOUNTER — Other Ambulatory Visit: Payer: Self-pay | Admitting: Hematology

## 2016-03-06 ENCOUNTER — Ambulatory Visit (HOSPITAL_BASED_OUTPATIENT_CLINIC_OR_DEPARTMENT_OTHER): Payer: Medicare Other | Admitting: Nurse Practitioner

## 2016-03-06 ENCOUNTER — Other Ambulatory Visit (HOSPITAL_BASED_OUTPATIENT_CLINIC_OR_DEPARTMENT_OTHER): Payer: Medicare Other

## 2016-03-06 VITALS — BP 137/90 | HR 75 | Temp 97.5°F | Resp 17

## 2016-03-06 VITALS — BP 134/86 | HR 76 | Temp 97.9°F | Resp 18 | Wt 207.1 lb

## 2016-03-06 DIAGNOSIS — C439 Malignant melanoma of skin, unspecified: Secondary | ICD-10-CM

## 2016-03-06 DIAGNOSIS — C7800 Secondary malignant neoplasm of unspecified lung: Secondary | ICD-10-CM | POA: Diagnosis not present

## 2016-03-06 DIAGNOSIS — E039 Hypothyroidism, unspecified: Secondary | ICD-10-CM | POA: Diagnosis not present

## 2016-03-06 DIAGNOSIS — C770 Secondary and unspecified malignant neoplasm of lymph nodes of head, face and neck: Secondary | ICD-10-CM | POA: Diagnosis not present

## 2016-03-06 DIAGNOSIS — C7931 Secondary malignant neoplasm of brain: Secondary | ICD-10-CM

## 2016-03-06 DIAGNOSIS — R5383 Other fatigue: Secondary | ICD-10-CM

## 2016-03-06 DIAGNOSIS — Z79899 Other long term (current) drug therapy: Secondary | ICD-10-CM

## 2016-03-06 DIAGNOSIS — I10 Essential (primary) hypertension: Secondary | ICD-10-CM | POA: Diagnosis not present

## 2016-03-06 DIAGNOSIS — Z5112 Encounter for antineoplastic immunotherapy: Secondary | ICD-10-CM | POA: Diagnosis present

## 2016-03-06 DIAGNOSIS — Z8673 Personal history of transient ischemic attack (TIA), and cerebral infarction without residual deficits: Secondary | ICD-10-CM | POA: Diagnosis not present

## 2016-03-06 DIAGNOSIS — C799 Secondary malignant neoplasm of unspecified site: Secondary | ICD-10-CM

## 2016-03-06 DIAGNOSIS — C7951 Secondary malignant neoplasm of bone: Secondary | ICD-10-CM

## 2016-03-06 LAB — CBC WITH DIFFERENTIAL/PLATELET
BASO%: 0.6 % (ref 0.0–2.0)
Basophils Absolute: 0 10*3/uL (ref 0.0–0.1)
EOS%: 5.2 % (ref 0.0–7.0)
Eosinophils Absolute: 0.3 10*3/uL (ref 0.0–0.5)
HCT: 50.2 % — ABNORMAL HIGH (ref 38.4–49.9)
HGB: 16.6 g/dL (ref 13.0–17.1)
LYMPH%: 9.2 % — AB (ref 14.0–49.0)
MCH: 29.9 pg (ref 27.2–33.4)
MCHC: 33 g/dL (ref 32.0–36.0)
MCV: 90.5 fL (ref 79.3–98.0)
MONO#: 0.5 10*3/uL (ref 0.1–0.9)
MONO%: 6.9 % (ref 0.0–14.0)
NEUT#: 5.1 10*3/uL (ref 1.5–6.5)
NEUT%: 78.1 % — AB (ref 39.0–75.0)
PLATELETS: 188 10*3/uL (ref 140–400)
RBC: 5.54 10*6/uL (ref 4.20–5.82)
RDW: 14.6 % (ref 11.0–14.6)
WBC: 6.5 10*3/uL (ref 4.0–10.3)
lymph#: 0.6 10*3/uL — ABNORMAL LOW (ref 0.9–3.3)

## 2016-03-06 LAB — COMPREHENSIVE METABOLIC PANEL
ALT: 20 U/L (ref 0–55)
ANION GAP: 10 meq/L (ref 3–11)
AST: 23 U/L (ref 5–34)
Albumin: 3.6 g/dL (ref 3.5–5.0)
Alkaline Phosphatase: 71 U/L (ref 40–150)
BUN: 26.1 mg/dL — ABNORMAL HIGH (ref 7.0–26.0)
CHLORIDE: 107 meq/L (ref 98–109)
CO2: 22 meq/L (ref 22–29)
CREATININE: 1.3 mg/dL (ref 0.7–1.3)
Calcium: 9.3 mg/dL (ref 8.4–10.4)
EGFR: 51 mL/min/{1.73_m2} — AB (ref >90)
Glucose: 104 mg/dl (ref 70–140)
POTASSIUM: 5.2 meq/L — AB (ref 3.5–5.1)
Sodium: 139 mEq/L (ref 136–145)
Total Bilirubin: 0.43 mg/dL (ref 0.20–1.20)
Total Protein: 6.9 g/dL (ref 6.4–8.3)

## 2016-03-06 LAB — TSH: TSH: 14.178 m[IU]/L — AB (ref 0.320–4.118)

## 2016-03-06 MED ORDER — DENOSUMAB 120 MG/1.7ML ~~LOC~~ SOLN
120.0000 mg | Freq: Once | SUBCUTANEOUS | Status: AC
  Administered 2016-03-06: 120 mg via SUBCUTANEOUS
  Filled 2016-03-06: qty 1.7

## 2016-03-06 MED ORDER — SODIUM CHLORIDE 0.9 % IV SOLN
240.0000 mg | Freq: Once | INTRAVENOUS | Status: AC
  Administered 2016-03-06: 240 mg via INTRAVENOUS
  Filled 2016-03-06: qty 20

## 2016-03-06 MED ORDER — SODIUM CHLORIDE 0.9 % IV SOLN
Freq: Once | INTRAVENOUS | Status: AC
  Administered 2016-03-06: 16:00:00 via INTRAVENOUS

## 2016-03-06 NOTE — Progress Notes (Signed)
[facility] OFFICE PROGRESS NOTE   Diagnosis:  Metastatic melanoma Oncology History   Metastatic melanoma [facility])   Staging form: Melanoma of the Skin, AJCC 7th Edition     Clinical stage from 09/21/2015: Stage IV [place], [place], M1c) - Signed by David Merle, MD on 10/09/2015      Metastatic melanoma ([place])   09/16/2015 Imaging    PET scan showed a hypermetabolic 4.1OI lingular nodule, and a 41m right upper lobe lung nodule, left subclavicular nodes, and bone metastasis in the proximal right humerus and right femur.      09/21/2015 Initial Diagnosis    [place]     09/21/2015 Initial Biopsy    Left subclavicular lymph node biopsy showed prostatic of melanoma     09/21/2015 Miscellaneous    BRAF V600K mutation (+)      10/04/2015 Imaging    CT head with without contrast showed a 2.0 x 1.6 x 1.7 cm superficial left frontal hyperdense lesion, with postcontrast enhancement, lying within the previous hemorrhagic area, concerning for solitary melanoma metastasis     10/06/2015 -  Chemotherapy    Nivolumab [id number] 2 weeks      10/13/2015 - 10/13/2015 Radiation Therapy    SRS to his left frontal solitary brain metastasis      10/15/2015 Surgery    left front craniotomy and tumor excision      10/15/2015 Pathology Results    left frontal brain mass excision showed metastatic melanoma, 1.5X1.5X0.6cm     01/13/2016 Imaging    CT head without and with contrast showed ill-defined enhancement and dural thickening of the left frontal resection cavity may represent a post treatment changes or residual neoplasm. A new 6 mm hypodensity in the right cerebellar hemisphere with uncertainty enhancement may represent a metastasis or small brain parenchymal hemorrhage.      01/27/2016 -  Radiation Therapy    SRS to his new 6 mm lesion in the right cerebellum      INTERVAL HISTORY:   [name] as scheduled. He continues  nivolumab every 2 weeks, last treatment 02/16/2016. No significant nausea/vomiting. No mouth sores. No diarrhea. He applied topical Efudex to his face and forearms about 3 weeks ago, resulting in a rash. He denies shortness of breath. No chest pain. No cough. No fever.  Objective:  Vital signs in last 24 hours:  Blood pressure 134/86, pulse 76, temperature 97.9 F (36.6 C), temperature source Oral, resp. rate 18, weight 207 lb 2 oz (94 kg), SpO2 96 %.    HEENT: No thrush or ulcers. Resp: Lungs clear bilaterally. Cardio: Regular rate and rhythm. GI: Abdomen soft and nontender. No hepatomegaly. Vascular: No leg edema. Skin: Skin over the nose is erythematous. Scattered drying lesions over the forehead and forearms.    Lab Results:  Lab Results  Component Value Date   WBC 6.5 03/06/2016   HGB 16.6 03/06/2016   HCT 50.2 (H) 03/06/2016   MCV 90.5 03/06/2016   PLT 188 03/06/2016   NEUTROABS 5.1 03/06/2016    Imaging:  No results found.  Medications: I have reviewed the patient's current medications.  Assessment/Plan: 1. Metastatic melanoma to lung, node, bone and brain, BRAF V600K mutation (+); on active treatment with nivolumab every 2 weeks. 2. Hemorrhagic stroke and brain metastasis status post surgical resection and SRS 3. Fatigue 4. Hypothyroidism. PCP is adjusting Synthroid. 5. Hypertension. Dr. FeBurr Medicoecommended he resume lisinopril following office visit 02/02/2016.   Disposition: David [name]  appears stable. Plan to proceed with nivolumab today as planned and continue every 2 weeks. Dr. Burr [name] recommends a restaging PET scan in approximately 3 weeks.  He will begin [place] today. He reports being up-to-date on dental exams. We discussed the potential for osteonecrosis of the jaw. He is agreeable to proceed.  He will return for a follow-up visit in 4 weeks. He will contact the office in the interim with any problems.  Plan reviewed with Dr. Burr [name].     David [name]  ANP/GNP-BC   03/06/2016  3:05 PM

## 2016-03-06 NOTE — Patient Instructions (Signed)
[facility] Discharge Instructions for Patients Receiving Chemotherapy  Today you received the following chemotherapy agents Nivolumab  To help prevent nausea and vomiting after your treatment, we encourage you to take your nausea medication    If you develop nausea and vomiting that is not controlled by your nausea medication, call the clinic.   BELOW ARE SYMPTOMS THAT SHOULD BE REPORTED IMMEDIATELY:  *FEVER GREATER THAN 100.5 F  *CHILLS WITH OR WITHOUT FEVER  NAUSEA AND VOMITING THAT IS NOT CONTROLLED WITH YOUR NAUSEA MEDICATION  *UNUSUAL SHORTNESS OF BREATH  *UNUSUAL BRUISING OR BLEEDING  TENDERNESS IN MOUTH AND THROAT WITH OR WITHOUT PRESENCE OF ULCERS  *URINARY PROBLEMS  *BOWEL PROBLEMS  UNUSUAL RASH Items with * indicate a potential emergency and should be followed up as soon as possible.  Feel free to call the clinic you have any questions or concerns. The clinic phone number is [phone].  Please show the [facility] at check-in to the Emergency Department and triage nurse.  Denosumab injection What is this medicine? DENOSUMAB (den oh sue mab) slows bone breakdown. Prolia is used to treat osteoporosis in women after menopause and in men. Delton See is used to prevent bone fractures and other bone problems caused by cancer bone metastases. Delton See is also used to treat giant cell tumor of the bone. COMMON BRAND NAME(S): Prolia, XGEVA What should I tell my health care provider before I take this medicine? They need to know if you have any of these conditions: -dental disease -eczema -infection or history of infections -kidney disease or on dialysis -low blood calcium or vitamin D -malabsorption syndrome -scheduled to have surgery or tooth extraction -taking medicine that contains denosumab -thyroid or parathyroid disease -an unusual reaction to denosumab, other medicines, foods, dyes, or preservatives -pregnant or trying to get  pregnant -breast-feeding How should I use this medicine? This medicine is for injection under the skin. It is given by a health care professional in a hospital or clinic setting. If you are getting Prolia, a special MedGuide will be given to you by the pharmacist with each prescription and refill. Be sure to read this information carefully each time. For Prolia, talk to your pediatrician regarding the use of this medicine in children. Special care may be needed. For Delton See, talk to your pediatrician regarding the use of this medicine in children. While this drug may be prescribed for children as young as 13 years for selected conditions, precautions do apply. What if I miss a dose? It is important not to miss your dose. Call your doctor or health care professional if you are unable to keep an appointment. What may interact with this medicine? Do not take this medicine with any of the following medications: -other medicines containing denosumab This medicine may also interact with the following medications: -medicines that suppress the immune system -medicines that treat cancer -steroid medicines like prednisone or cortisone What should I watch for while using this medicine? Visit your doctor or health care professional for regular checks on your progress. Your doctor or health care professional may order blood tests and other tests to see how you are doing. Call your doctor or health care professional if you get a cold or other infection while receiving this medicine. Do not treat yourself. This medicine may decrease your body's ability to fight infection. You should make sure you get enough calcium and vitamin D while you are taking this medicine, unless your doctor tells you not to. Discuss the  foods you eat and the vitamins you take with your health care professional. See your dentist regularly. Brush and floss your teeth as directed. Before you have any dental work done, tell your dentist you  are receiving this medicine. Do not become pregnant while taking this medicine or for 5 months after stopping it. Women should inform their doctor if they wish to become pregnant or think they might be pregnant. There is a potential for serious side effects to an unborn child. Talk to your health care professional or pharmacist for more information. What side effects may I notice from receiving this medicine? Side effects that you should report to your doctor or health care professional as soon as possible: -allergic reactions like skin rash, itching or hives, swelling of the face, lips, or tongue -breathing problems -chest pain -fast, irregular heartbeat -feeling faint or lightheaded, falls -fever, chills, or any other sign of infection -muscle spasms, tightening, or twitches -numbness or tingling -skin blisters or bumps, or is dry, peels, or red -slow healing or unexplained pain in the mouth or jaw -unusual bleeding or bruising Side effects that usually do not require medical attention (report to your doctor or health care professional if they continue or are bothersome): -muscle pain -stomach upset, gas Where should I keep my medicine? This medicine is only given in a clinic, doctor's office, or other health care setting and will not be stored at home.  2017 [employer]/Gold Standard (2015-04-29 10:06:55)

## 2016-03-14 ENCOUNTER — Other Ambulatory Visit: Payer: Self-pay | Admitting: Internal Medicine

## 2016-03-17 ENCOUNTER — Other Ambulatory Visit: Payer: Self-pay | Admitting: Radiation Therapy

## 2016-03-17 DIAGNOSIS — C7949 Secondary malignant neoplasm of other parts of nervous system: Principal | ICD-10-CM

## 2016-03-17 DIAGNOSIS — C7931 Secondary malignant neoplasm of brain: Secondary | ICD-10-CM

## 2016-03-19 ENCOUNTER — Encounter: Payer: Self-pay | Admitting: Hematology

## 2016-03-20 ENCOUNTER — Telehealth: Payer: Self-pay | Admitting: Hematology

## 2016-03-20 NOTE — Telephone Encounter (Signed)
Spoke with patient re appointments for 12/13 and 12/27.

## 2016-03-22 ENCOUNTER — Other Ambulatory Visit: Payer: Self-pay | Admitting: Hematology

## 2016-03-22 ENCOUNTER — Other Ambulatory Visit (HOSPITAL_BASED_OUTPATIENT_CLINIC_OR_DEPARTMENT_OTHER): Payer: Medicare Other

## 2016-03-22 ENCOUNTER — Ambulatory Visit (HOSPITAL_BASED_OUTPATIENT_CLINIC_OR_DEPARTMENT_OTHER): Payer: Medicare Other

## 2016-03-22 ENCOUNTER — Encounter: Payer: Medicare Other | Attending: Physical Medicine & Rehabilitation | Admitting: Physical Medicine & Rehabilitation

## 2016-03-22 VITALS — BP 132/86 | HR 75 | Temp 98.4°F | Resp 16

## 2016-03-22 DIAGNOSIS — N189 Chronic kidney disease, unspecified: Secondary | ICD-10-CM | POA: Insufficient documentation

## 2016-03-22 DIAGNOSIS — C7800 Secondary malignant neoplasm of unspecified lung: Secondary | ICD-10-CM

## 2016-03-22 DIAGNOSIS — R338 Other retention of urine: Secondary | ICD-10-CM | POA: Insufficient documentation

## 2016-03-22 DIAGNOSIS — Z5112 Encounter for antineoplastic immunotherapy: Secondary | ICD-10-CM

## 2016-03-22 DIAGNOSIS — G4733 Obstructive sleep apnea (adult) (pediatric): Secondary | ICD-10-CM | POA: Insufficient documentation

## 2016-03-22 DIAGNOSIS — C7951 Secondary malignant neoplasm of bone: Secondary | ICD-10-CM

## 2016-03-22 DIAGNOSIS — C7931 Secondary malignant neoplasm of brain: Secondary | ICD-10-CM | POA: Diagnosis not present

## 2016-03-22 DIAGNOSIS — C439 Malignant melanoma of skin, unspecified: Secondary | ICD-10-CM | POA: Diagnosis present

## 2016-03-22 DIAGNOSIS — Z79899 Other long term (current) drug therapy: Secondary | ICD-10-CM | POA: Diagnosis not present

## 2016-03-22 DIAGNOSIS — E7800 Pure hypercholesterolemia, unspecified: Secondary | ICD-10-CM | POA: Insufficient documentation

## 2016-03-22 DIAGNOSIS — I252 Old myocardial infarction: Secondary | ICD-10-CM | POA: Insufficient documentation

## 2016-03-22 DIAGNOSIS — N401 Enlarged prostate with lower urinary tract symptoms: Secondary | ICD-10-CM | POA: Insufficient documentation

## 2016-03-22 DIAGNOSIS — C3490 Malignant neoplasm of unspecified part of unspecified bronchus or lung: Secondary | ICD-10-CM

## 2016-03-22 DIAGNOSIS — I69151 Hemiplegia and hemiparesis following nontraumatic intracerebral hemorrhage affecting right dominant side: Secondary | ICD-10-CM | POA: Insufficient documentation

## 2016-03-22 DIAGNOSIS — Z5189 Encounter for other specified aftercare: Secondary | ICD-10-CM | POA: Insufficient documentation

## 2016-03-22 DIAGNOSIS — I69219 Unspecified symptoms and signs involving cognitive functions following other nontraumatic intracranial hemorrhage: Secondary | ICD-10-CM | POA: Insufficient documentation

## 2016-03-22 DIAGNOSIS — C799 Secondary malignant neoplasm of unspecified site: Secondary | ICD-10-CM

## 2016-03-22 DIAGNOSIS — I69120 Aphasia following nontraumatic intracerebral hemorrhage: Secondary | ICD-10-CM | POA: Insufficient documentation

## 2016-03-22 DIAGNOSIS — Z87891 Personal history of nicotine dependence: Secondary | ICD-10-CM | POA: Insufficient documentation

## 2016-03-22 DIAGNOSIS — C770 Secondary and unspecified malignant neoplasm of lymph nodes of head, face and neck: Secondary | ICD-10-CM

## 2016-03-22 DIAGNOSIS — I129 Hypertensive chronic kidney disease with stage 1 through stage 4 chronic kidney disease, or unspecified chronic kidney disease: Secondary | ICD-10-CM | POA: Insufficient documentation

## 2016-03-22 LAB — CBC WITH DIFFERENTIAL/PLATELET
BASO%: 0.3 % (ref 0.0–2.0)
Basophils Absolute: 0 10*3/uL (ref 0.0–0.1)
EOS ABS: 0.9 10*3/uL — AB (ref 0.0–0.5)
EOS%: 15.1 % — AB (ref 0.0–7.0)
HCT: 51.4 % — ABNORMAL HIGH (ref 38.4–49.9)
HGB: 16.5 g/dL (ref 13.0–17.1)
LYMPH%: 7.7 % — AB (ref 14.0–49.0)
MCH: 29.6 pg (ref 27.2–33.4)
MCHC: 32.1 g/dL (ref 32.0–36.0)
MCV: 92 fL (ref 79.3–98.0)
MONO#: 0.6 10*3/uL (ref 0.1–0.9)
MONO%: 10 % (ref 0.0–14.0)
NEUT%: 66.9 % (ref 39.0–75.0)
NEUTROS ABS: 3.8 10*3/uL (ref 1.5–6.5)
Platelets: 154 10*3/uL (ref 140–400)
RBC: 5.59 10*6/uL (ref 4.20–5.82)
RDW: 14.9 % — ABNORMAL HIGH (ref 11.0–14.6)
WBC: 5.7 10*3/uL (ref 4.0–10.3)
lymph#: 0.4 10*3/uL — ABNORMAL LOW (ref 0.9–3.3)

## 2016-03-22 LAB — COMPREHENSIVE METABOLIC PANEL
ALT: 19 U/L (ref 0–55)
AST: 21 U/L (ref 5–34)
Albumin: 3.7 g/dL (ref 3.5–5.0)
Alkaline Phosphatase: 68 U/L (ref 40–150)
Anion Gap: 10 mEq/L (ref 3–11)
BILIRUBIN TOTAL: 0.4 mg/dL (ref 0.20–1.20)
BUN: 36.3 mg/dL — ABNORMAL HIGH (ref 7.0–26.0)
CO2: 25 meq/L (ref 22–29)
Calcium: 10 mg/dL (ref 8.4–10.4)
Chloride: 109 mEq/L (ref 98–109)
Creatinine: 1.6 mg/dL — ABNORMAL HIGH (ref 0.7–1.3)
EGFR: 42 mL/min/{1.73_m2} — AB (ref >90)
GLUCOSE: 148 mg/dL — AB (ref 70–140)
POTASSIUM: 4.6 meq/L (ref 3.5–5.1)
SODIUM: 144 meq/L (ref 136–145)
TOTAL PROTEIN: 7.2 g/dL (ref 6.4–8.3)

## 2016-03-22 LAB — TSH: TSH: 3.802 m[IU]/L (ref 0.320–4.118)

## 2016-03-22 MED ORDER — SODIUM CHLORIDE 0.9 % IV SOLN
Freq: Once | INTRAVENOUS | Status: AC
  Administered 2016-03-22: 10:00:00 via INTRAVENOUS

## 2016-03-22 MED ORDER — SODIUM CHLORIDE 0.9 % IV SOLN
240.0000 mg | Freq: Once | INTRAVENOUS | Status: AC
  Administered 2016-03-22: 240 mg via INTRAVENOUS
  Filled 2016-03-22: qty 4

## 2016-03-22 MED ORDER — SODIUM CHLORIDE 0.9 % IV SOLN: 1000.0000 mL | Freq: Once | INTRAVENOUS | Status: AC

## 2016-03-22 MED ORDER — SODIUM CHLORIDE 0.9 % IV SOLN
Freq: Once | INTRAVENOUS | Status: AC
  Administered 2016-03-22: 08:00:00 via INTRAVENOUS

## 2016-03-22 NOTE — Progress Notes (Signed)
Ok to treat with creatinine today. NS bolus added (See eMAR) per MD Burr Medico

## 2016-03-22 NOTE — Patient Instructions (Signed)
[facility] Discharge Instructions for Patients Receiving Chemotherapy  Today you received the following chemotherapy agents Opdivo.  To help prevent nausea and vomiting after your treatment, we encourage you to take your nausea medication as directed.   If you develop nausea and vomiting that is not controlled by your nausea medication, call the clinic.   BELOW ARE SYMPTOMS THAT SHOULD BE REPORTED IMMEDIATELY:  *FEVER GREATER THAN 100.5 F  *CHILLS WITH OR WITHOUT FEVER  NAUSEA AND VOMITING THAT IS NOT CONTROLLED WITH YOUR NAUSEA MEDICATION  *UNUSUAL SHORTNESS OF BREATH  *UNUSUAL BRUISING OR BLEEDING  TENDERNESS IN MOUTH AND THROAT WITH OR WITHOUT PRESENCE OF ULCERS  *URINARY PROBLEMS  *BOWEL PROBLEMS  UNUSUAL RASH Items with * indicate a potential emergency and should be followed up as soon as possible.  Feel free to call the clinic you have any questions or concerns. The clinic phone number is [phone].  Please show the CHEMO ALERT CARD at check-in to the Emergency Department and triage nurse.    

## 2016-03-23 ENCOUNTER — Ambulatory Visit: Payer: Medicare Other | Admitting: Physical Medicine & Rehabilitation

## 2016-03-29 ENCOUNTER — Encounter (HOSPITAL_COMMUNITY)
Admission: RE | Admit: 2016-03-29 | Discharge: 2016-03-29 | Disposition: A | Discharge status: Home / Self Care | Payer: Medicare Other | Source: Ambulatory Visit | Attending: Nurse Practitioner | Admitting: Nurse Practitioner

## 2016-03-29 ENCOUNTER — Encounter: Payer: Self-pay | Admitting: Hematology

## 2016-03-29 DIAGNOSIS — C7800 Secondary malignant neoplasm of unspecified lung: Secondary | ICD-10-CM

## 2016-03-29 DIAGNOSIS — C439 Malignant melanoma of skin, unspecified: Secondary | ICD-10-CM

## 2016-03-29 DIAGNOSIS — C7931 Secondary malignant neoplasm of brain: Secondary | ICD-10-CM | POA: Diagnosis present

## 2016-03-29 DIAGNOSIS — C799 Secondary malignant neoplasm of unspecified site: Secondary | ICD-10-CM | POA: Insufficient documentation

## 2016-03-29 LAB — GLUCOSE, CAPILLARY: Glucose-Capillary: 99 mg/dL (ref 65–99)

## 2016-03-29 MED ORDER — FLUDEOXYGLUCOSE F - 18 (FDG) INJECTION
9.8300 | Freq: Once | INTRAVENOUS | Status: AC | PRN
  Administered 2016-03-29: 9.83 via INTRAVENOUS

## 2016-03-31 ENCOUNTER — Other Ambulatory Visit: Payer: Self-pay | Admitting: Hematology

## 2016-03-31 DIAGNOSIS — C799 Secondary malignant neoplasm of unspecified site: Secondary | ICD-10-CM

## 2016-03-31 DIAGNOSIS — C439 Malignant melanoma of skin, unspecified: Secondary | ICD-10-CM

## 2016-04-05 ENCOUNTER — Telehealth: Payer: Self-pay | Admitting: *Deleted

## 2016-04-05 ENCOUNTER — Telehealth: Payer: Self-pay | Admitting: Hematology

## 2016-04-05 ENCOUNTER — Other Ambulatory Visit (HOSPITAL_BASED_OUTPATIENT_CLINIC_OR_DEPARTMENT_OTHER): Payer: Medicare Other

## 2016-04-05 ENCOUNTER — Ambulatory Visit (HOSPITAL_BASED_OUTPATIENT_CLINIC_OR_DEPARTMENT_OTHER): Payer: Medicare Other

## 2016-04-05 ENCOUNTER — Ambulatory Visit (HOSPITAL_BASED_OUTPATIENT_CLINIC_OR_DEPARTMENT_OTHER): Payer: Medicare Other | Admitting: Hematology

## 2016-04-05 VITALS — BP 135/99 | HR 79 | Temp 97.5°F | Resp 17 | Ht 69.0 in | Wt 204.0 lb

## 2016-04-05 DIAGNOSIS — R5383 Other fatigue: Secondary | ICD-10-CM

## 2016-04-05 DIAGNOSIS — Z5112 Encounter for antineoplastic immunotherapy: Secondary | ICD-10-CM | POA: Diagnosis present

## 2016-04-05 DIAGNOSIS — I10 Essential (primary) hypertension: Secondary | ICD-10-CM

## 2016-04-05 DIAGNOSIS — C799 Secondary malignant neoplasm of unspecified site: Secondary | ICD-10-CM

## 2016-04-05 DIAGNOSIS — C7800 Secondary malignant neoplasm of unspecified lung: Secondary | ICD-10-CM | POA: Diagnosis not present

## 2016-04-05 DIAGNOSIS — Z79899 Other long term (current) drug therapy: Secondary | ICD-10-CM | POA: Diagnosis not present

## 2016-04-05 DIAGNOSIS — R944 Abnormal results of kidney function studies: Secondary | ICD-10-CM | POA: Diagnosis not present

## 2016-04-05 DIAGNOSIS — C439 Malignant melanoma of skin, unspecified: Secondary | ICD-10-CM

## 2016-04-05 DIAGNOSIS — C770 Secondary and unspecified malignant neoplasm of lymph nodes of head, face and neck: Secondary | ICD-10-CM

## 2016-04-05 DIAGNOSIS — E039 Hypothyroidism, unspecified: Secondary | ICD-10-CM

## 2016-04-05 DIAGNOSIS — C7951 Secondary malignant neoplasm of bone: Secondary | ICD-10-CM

## 2016-04-05 DIAGNOSIS — C7931 Secondary malignant neoplasm of brain: Secondary | ICD-10-CM

## 2016-04-05 DIAGNOSIS — I619 Nontraumatic intracerebral hemorrhage, unspecified: Secondary | ICD-10-CM

## 2016-04-05 LAB — CBC WITH DIFFERENTIAL/PLATELET
BASO%: 0.2 % (ref 0.0–2.0)
Basophils Absolute: 0 10*3/uL (ref 0.0–0.1)
EOS%: 22.7 % — AB (ref 0.0–7.0)
Eosinophils Absolute: 1.4 10*3/uL — ABNORMAL HIGH (ref 0.0–0.5)
HCT: 46.3 % (ref 38.4–49.9)
HGB: 15.7 g/dL (ref 13.0–17.1)
LYMPH%: 8.7 % — AB (ref 14.0–49.0)
MCH: 30.3 pg (ref 27.2–33.4)
MCHC: 33.9 g/dL (ref 32.0–36.0)
MCV: 89.4 fL (ref 79.3–98.0)
MONO#: 0.3 10*3/uL (ref 0.1–0.9)
MONO%: 5.2 % (ref 0.0–14.0)
NEUT#: 3.9 10*3/uL (ref 1.5–6.5)
NEUT%: 63.2 % (ref 39.0–75.0)
Platelets: 164 10*3/uL (ref 140–400)
RBC: 5.18 10*6/uL (ref 4.20–5.82)
RDW: 14.1 % (ref 11.0–14.6)
WBC: 6.1 10*3/uL (ref 4.0–10.3)
lymph#: 0.5 10*3/uL — ABNORMAL LOW (ref 0.9–3.3)

## 2016-04-05 LAB — COMPREHENSIVE METABOLIC PANEL
ALBUMIN: 3.4 g/dL — AB (ref 3.5–5.0)
ALK PHOS: 65 U/L (ref 40–150)
ALT: 17 U/L (ref 0–55)
ANION GAP: 9 meq/L (ref 3–11)
AST: 20 U/L (ref 5–34)
BUN: 32.5 mg/dL — AB (ref 7.0–26.0)
CALCIUM: 9.1 mg/dL (ref 8.4–10.4)
CHLORIDE: 110 meq/L — AB (ref 98–109)
CO2: 20 mEq/L — ABNORMAL LOW (ref 22–29)
Creatinine: 1.4 mg/dL — ABNORMAL HIGH (ref 0.7–1.3)
EGFR: 50 mL/min/{1.73_m2} — ABNORMAL LOW (ref >90)
Glucose: 176 mg/dl — ABNORMAL HIGH (ref 70–140)
POTASSIUM: 5.3 meq/L — AB (ref 3.5–5.1)
Sodium: 139 mEq/L (ref 136–145)
Total Bilirubin: 0.36 mg/dL (ref 0.20–1.20)
Total Protein: 6.7 g/dL (ref 6.4–8.3)

## 2016-04-05 LAB — TSH: TSH: 0.26 m(IU)/L — ABNORMAL LOW (ref 0.320–4.118)

## 2016-04-05 MED ORDER — SODIUM CHLORIDE 0.9 % IV SOLN
240.0000 mg | Freq: Once | INTRAVENOUS | Status: AC
  Administered 2016-04-05: 240 mg via INTRAVENOUS
  Filled 2016-04-05: qty 4

## 2016-04-05 MED ORDER — SODIUM CHLORIDE 0.9 % IV SOLN
Freq: Once | INTRAVENOUS | Status: AC
  Administered 2016-04-05: 16:00:00 via INTRAVENOUS

## 2016-04-05 MED ORDER — DENOSUMAB 120 MG/1.7ML ~~LOC~~ SOLN
120.0000 mg | Freq: Once | SUBCUTANEOUS | Status: AC
  Administered 2016-04-05: 120 mg via SUBCUTANEOUS
  Filled 2016-04-05: qty 1.7

## 2016-04-05 NOTE — Progress Notes (Signed)
Notified Dr. Burr Medico of patient's elevated K+. Advised ok to treat. No additional orders received.

## 2016-04-05 NOTE — Telephone Encounter (Signed)
Per LOS I have scheduled and gave pt a calendar

## 2016-04-05 NOTE — Progress Notes (Signed)
David [name]  Telephone:(336) [phone] Fax:(336) [phone]  Clinic Follow up Note   Patient Care Team: [name], MD as PCP - General (Internal Medicine) 04/05/2016  Chief complaint: Follow-up metastatic melanoma  Oncology History   Metastatic melanoma [facility])   Staging form: Melanoma of the Skin, AJCC 7th Edition     Clinical stage from 09/21/2015: Stage IV [place], [place], [place]) - Signed by [name], MD on 10/09/2015       Metastatic melanoma ([place])   09/16/2015 Imaging    PET scan showed a hypermetabolic 0.2RK lingular nodule, and a 57m right upper lobe lung nodule, left subclavicular nodes, and bone metastasis in the proximal right humerus and right femur.       09/21/2015 Initial Diagnosis    Metastatic melanoma ([place]      09/21/2015 Initial Biopsy    Left subclavicular lymph node biopsy showed prostatic of melanoma      09/21/2015 Miscellaneous    BRAF V600K mutation (+)       10/04/2015 Imaging    CT head with without contrast showed a 2.0 x 1.6 x 1.7 cm superficial left frontal hyperdense lesion, with postcontrast enhancement, lying within the previous hemorrhagic area, concerning for solitary melanoma metastasis      10/06/2015 -  Chemotherapy    Nivolumab 2465mevery 2 weeks       10/13/2015 - 10/13/2015 Radiation Therapy    10/13/2015 Preop SRS treatment: 14 Gy  to the left frontal lesion in 1 fraction      10/15/2015 Surgery    left front craniotomy and tumor excision       10/15/2015 Pathology Results    left frontal brain mass excision showed metastatic melanoma, 1.5X1.5X0.6cm      01/13/2016 Imaging    CT head without and with contrast showed ill-defined enhancement and dural thickening of the left frontal resection cavity may represent a post treatment changes or residual neoplasm. A new 6 mm hypodensity in the right cerebellar hemisphere with uncertainty enhancement may represent a metastasis or small brain parenchymal hemorrhage.       01/27/2016 -  01/27/2016 Radiation Therapy    01/27/16 SRS Treatment:  20 Gy in 1 fraction to a 6 mm right cerebellar brain met       03/29/2016 PET scan    PET 03/29/2016 IMPRESSION: 1. Overall there has been an interval response to therapy. The index lesions sided on previous exam it are less hypermetabolic when compared with the previous exam. 2. There is an intramuscular lesion within the right masseter which has increased FDG uptake compared with previous exam. 3. No new sites of disease identified. 4. Aortic atherosclerosis and infrarenal abdominal aortic aneurysm. Recommend followup by ultrasound in 3 years. This recommendation follows ACR consensus guidelines: White Paper of the ACR Incidental Findings Committee II on Vascular Findings. 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 He was found to have left frontal hemorrhage 5.7 cm x 4.5 cm with 6 mm left to right shift.During the workup for his stroke, a left upper lung mass was noticed on the CT scan. I was called to evaluate his lung mass.  Patient has aphasia, not able to communicate much. According to his daughter, Dr. LeGala Romneywho is a OB GYN at [facility] probably only smoked for very  short period time during the college. He did have 3 skin melanoma, which was removed, last one was 2-3 years ago. No other cancer history.  CURRENT THERAPY: Nivolumab 240 mg, every 2 weeks, started on 10/06/2015  INTERVAL HISTORY: Mr. Mairena returns for follow-up and nivolumab therapy. The patient feels better with his TSH level back to normal. However, he is not 100% back to normal. He is not playing golf much due to a lack of strength. He has not been to the gym lately. He denies any headaches, however he reports his memory is not as well as it used to be before radiation. He still  has moderate fatigue. Denies any significant side effects from a Nivolumab infusion. He is drinking wine around 2-3 times a week with a glass with dinner. Reports a crown being placed in the right side of his upper tooth, denies pain in that area.  REVIEW OF SYSTEMS:  Constitutional: Denies fevers, chills or abnormal weight loss, (+) mild fatigue Eyes: Denies blurriness of vision Ears, nose, mouth, throat, and face: Denies mucositis or sore throat Respiratory: Denies cough, dyspnea or wheezes Cardiovascular: Denies palpitation, chest discomfort or lower extremity swelling Gastrointestinal:  Denies nausea, heartburn or change in bowel habits Skin: Denies abnormal skin rashes Lymphatics: Denies new lymphadenopathy or easy bruising Neurological:see HPI  Behavioral/Psych: Mood is stable, no new changes  All other systems were reviewed with the patient and are negative.  MEDICAL HISTORY:  Past Medical History:  Diagnosis Date  . Anginal pain ([place])   . Arthritis    mostly hands  . Benign familial hematuria   . BPH (benign prostatic hypertrophy)   . Brain cancer ([place])    melanoma with brain met  . Coronary artery disease   . GERD (gastroesophageal reflux disease)   . High cholesterol   . Hypertension   . Hypothyroidism   . Intracerebral hemorrhage ([place])   . Melanoma ([place])   . Metastatic melanoma to lung ([place])   . Mood disorder ([place])   . Myocardial infarction   . Pacemaker    due to syncope, 3rd degree HB (upgrade to [facility]. Jude CRT-P 02/25/13 (Dr. Uvaldo Rising)  . Rectal bleed    due to NSAIDS  . Renal disorder   . Skin cancer    melanoma  . Sleep apnea    uses CPAP  . Stroke [facility])     SURGICAL HISTORY: Past Surgical History:  Procedure Laterality Date  . APPLICATION OF CRANIAL NAVIGATION N/A 10/15/2015   Procedure: APPLICATION OF CRANIAL NAVIGATION;  Surgeon: Erline Levine, MD;  Location: [place] NEURO ORS;  Service: Neurosurgery;  Laterality: N/A;  . CARDIAC CATHETERIZATION  2013  .  CARDIAC SURGERY     bypass X 2  . CORONARY ARTERY BYPASS GRAFT  2013   LIMA-LAD, SVG-PDA 03/27/11 (Dr. Francee Gentile)  . CRANIOTOMY N/A 10/15/2015   Procedure: LEFT FRONTAL CRANIOTOMY TUMOR EXCISION with Curve;  Surgeon: Erline Levine, MD;  Location: [place] NEURO ORS;  Service: Neurosurgery;  Laterality: N/A;  CRANIOTOMY TUMOR EXCISION  . EYE SURGERY    . FOOT SURGERY  08/2010  . JOINT REPLACEMENT Left    partial knee  . KNEE ARTHROSCOPY  12/2008  . LYMPH NODE BIOPSY    . PACEMAKER INSERTION    . TONSILLECTOMY      I have reviewed the social history and family history with the patient and they are unchanged from previous note.  ALLERGIES:  is allergic to zocor [simvastatin].  MEDICATIONS:  Current Outpatient Prescriptions  Medication Sig Dispense Refill  . acetaminophen (TYLENOL) 325 MG tablet Take 2 tablets (650 mg total) by mouth every 6 (six) hours as needed for mild pain. (Patient taking differently: Take 650 mg by mouth 3 (three) times daily. )    . carvedilol (COREG) 6.25 MG tablet TK 1 T PO  BID  0  . levothyroxine (SYNTHROID, LEVOTHROID) 125 MCG tablet TAKE 1 TABLET BY MOUTH EVERY MORNING BEFORE BREAKFAST (Patient taking differently: Take  175 mcg  daily) 30 tablet 11  . Multiple Vitamin (MULTIVITAMIN WITH MINERALS) TABS tablet Take 1 tablet by mouth daily.    Marland Kitchen PRESCRIPTION MEDICATION Inhale into the lungs at bedtime. CPAP    . PREVNAR 13 SUSP injection ADM 0.5ML IM UTD  0  . rosuvastatin (CRESTOR) 10 MG tablet Take 10 mg by mouth at bedtime.    . sertraline (ZOLOFT) 50 MG tablet Take 2.5 tablets (125 mg total) by mouth daily. (Patient taking differently: Take 100 mg by mouth daily. )    . Turmeric Curcumin 500 MG CAPS Take 1,000 mg by mouth 2 (two) times daily.     Marland Kitchen UNABLE TO FIND Speech Therapy -  Please evaluate pt for  Partial  Aphasia and  Treat 1 Units 0   No current facility-administered medications for this visit.    Facility-Administered Medications Ordered in Other Visits    Medication Dose Route Frequency Provider Last Rate Last Dose  . 0.9 %  sodium chloride infusion  1,000 mL Intravenous Once [name], MD        PHYSICAL EXAMINATION: ECOG PERFORMANCE STATUS: 2 Vitals:   04/05/16 1340 04/05/16 1341  BP: (!) 146/99 (!) 135/99  Pulse: 79   Resp: 17   Temp: 97.5 F (36.4 C)   TempSrc: Oral   SpO2: 97%   Weight: 204 lb (92.5 kg)   Height: 5' 9"  (1.753 m)    GENERAL:alert, no distress and comfortable SKIN: skin color, texture, turgor are normal, no rashes or significant lesions EYES: normal, Conjunctiva are pink and non-injected, sclera clear OROPHARYNX:no exudate, no erythema and lips, buccal mucosa, and tongue normal  NECK: supple, thyroid normal size, non-tender, without nodularity LYMPH:  no palpable lymphadenopathy in the cervical, [place], axillary or inguinal LUNGS: clear to auscultation and percussion with normal breathing effort HEART: regular rate & rhythm and no murmurs and no lower extremity edema ABDOMEN:abdomen soft, non-tender and normal bowel sounds Musculoskeletal:no cyanosis of digits and no clubbing  NEURO: alert & oriented x 3 with fluent speech, no focal motor/sensory deficits, he walks with a cane independently, although his gait is slightly wobbly.   LABORATORY DATA:  I have reviewed the data as listed CBC Latest Ref Rng & Units 04/05/2016 03/22/2016 03/06/2016  WBC 4.0 - 10.3 10e3/uL 6.1 5.7 6.5  Hemoglobin 13.0 - 17.1 g/dL 15.7 16.5 16.6  Hematocrit 38.4 - 49.9 % 46.3 51.4(H) 50.2(H)  Platelets 140 - 400 10e3/uL 164 154 188     CMP Latest Ref Rng & Units 04/05/2016 03/22/2016 03/06/2016  Glucose 70 - 140 mg/dl 176(H) 148(H) 104  BUN 7.0 - 26.0 mg/dL 32.5(H) 36.3(H) 26.1(H)  Creatinine 0.7 - 1.3 mg/dL 1.4(H) 1.6(H) 1.3  Sodium 136 - 145 mEq/L 139 144 139  Potassium 3.5 - 5.1 mEq/L 5.3(H) 4.6 5.2(H)  Chloride 101 - 111 mmol/L - - -  CO2 22 - 29 mEq/L 20(L) 25 22  Calcium 8.4 - 10.4 mg/dL 9.1 10.0 9.3  Total Protein 6.4 -  8.3 g/dL 6.7 7.2 6.9  Total Bilirubin 0.20 - 1.20 mg/dL 0.36 0.40 0.43  Alkaline Phos 40 - 150 U/L 65 68 71  AST 5 - 34 U/L 20 21 23   ALT 0 - 55 U/L 17 19 20    TSH  Results for AMIT, LEECE (MRN [id number]) as of 04/05/2016 07:58  Ref. Range 01/19/2016 11:12 02/02/2016 09:20 02/16/2016 10:46 03/06/2016 14:04 03/22/2016 07:49  TSH Latest Ref Range: 0.320 - 4.118 m(IU)/L 2.292 5.671 (H) 11.911 (H) 14.178 (H) 3.802    PATHOLOGY REPORT: Diagnosis 09/21/2015 Lymph node, needle/core biopsy, hypermetabolic left sub clavicular LN - METASTATIC MALIGNANT MELANOMA. - SEE COMMENT. Microscopic Comment The case was discussed with Dr. Alen Blew on 09/22/15. Additional studies can be performed upon clinician request. (JBK:gt, 09/22/15)  Diagnosis 10/15/2015 Brain, for tumor resection, Left frontal - METASTATIC MELANOMA. Microscopic Comment The tumor is a high grade malignancy with frequent mitotic figures and focal intracytoplasmic pigment. Immunohistochemistry shows strong positivity with S-100, HMB45 and Melan-A. (JDP:ecj 10/18/2015)      RADIOGRAPHIC STUDIES: I have personally reviewed the radiological images as listed and agreed with the findings in the report.  PET 03/29/2016 IMPRESSION: 1. Overall there has been an interval response to therapy. The index lesions sided on previous exam it are less hypermetabolic when compared with the previous exam. 2. There is an intramuscular lesion within the right masseter which has increased FDG uptake compared with previous exam. 3. No new sites of disease identified. 4. Aortic atherosclerosis and infrarenal abdominal aortic aneurysm. Recommend followup by ultrasound in 3 years. This recommendation follows ACR consensus guidelines: White Paper of the ACR Incidental Findings Committee II on Vascular Findings. Natasha Mead Coll Radiol 2013; 10:789-794  CT head w wo contrast 01/13/2016 IMPRESSION: 1. Ill-defined enhancement and dural thickening of the left  frontal resection cavity may represent posttreatment changes or residual/recurrent neoplasm. Follow-up is recommended. 2. New 6 mm hyperdensity in right cerebellar hemisphere with uncertain enhancement may represent a metastasis or small brain parenchymal hemorrhage. 3. Mild interval dilatation of the lateral and third ventricles and resolution of left-to-right midline shift probably due to resolving left hemisphere edema and ex vacuo dilatation. These results will be called to the ordering clinician or representative by the Radiologist Assistant, and communication documented in the PACS or zVision Dashboard.  PET 12/23/2015 IMPRESSION: Improved PET-CT when compared to prior study. Some lesions have resolved and other lesions are smaller. This would suggest a good response to therapy. No new lesions/disease.  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   1. Metastatic melanoma to lung, node, bone and brain, BRAF V600K mutation (+) -I previously  reviewed his PET/CT scan images with pt and his family members. The scan revealed hypermetabolic left upper lobe lung mass, 2 small right lung nodules, probable bone metastasis, and a subcutaneous left supraclavicular lesion, which is not palpable  -His repeat his CT had showed a 2.0 cm left frontal hyperdense lesion with enhancement on contrast, in the area of his recent hemorrhagic stroke, highly suspicious for brain metastasis.  -We discussed his left supraclavicular node biopsy results, which showed metastatic melanoma.. -His case was discussed in our sarcoma tumor   -I previously recommend first-line treatment with Nivolumab alone, given his recent stroke, low tumor burden. I'll reserve BRAF inhibitor with or without MEK inhibitor as a second line therapy if he progress on immunotherapy. Other options of  immunotherapy, especially checkpoint inhibitor with ipilumimab,  were also discussed with pt and his daughter. We previously reviewed his restaging PET scan images in person with patient and his daughter, he had excellent partial response, especially the lung lesion and subcutaneous nodules. No other new lesions. -We discussed his restaging PET from 03/29/16 and it showed interval response to therapy, however there is an intramuscular lesion in the right masseter which has increased FDG uptake. No other new lesion. He is clinically doing well, I do not think he we need to change his systemic therapy at this point, but I'll obtain a close follow-up PET scan in 2 months to rule out disease progression. -We will continue Nivolumab every 2 weeks   2. Hemorrhagic stroke and brain metastasis  -I encouraged him to continue physical therapy and occupational therapy. He has recovered  very well -History post SBRT and surgical resection of his solitary brain metastasis  -He will continue following with Dr. Vertell Limber and Dr. Tammi Klippel. -I encouraged the patient to do puzzles and mind games to improve his brain function and memory. -He still has residual aphasia, I'll refer him to speech therapist again.  3. Fatigue -his fatigue has much improved after he restarted testosterone injection -I encouraged him to gradually increase his activity and exercise  4. Hypothyroidism  -He had baseline hypothyroidism before Nivo, but his TSH level has been fluctuating since he started Noivo -he sees his primary care physician Dr. Asher Muir every 2 weeks, who will adjust his Synthroid as needed   5. HTN -his BP has been high lately both at home and here  -His lisinopril was held during his stroke. I previously recommended him to resume lisinopril  -He will follow-up with his primary care physician  6. Kidney Function -The patient's Cr has recently become elevated at 1.4-1.6. -I advised him to cut back on caffeine and drink  more water.  7. ? Depression vs adjustment issue -Due to his wife's dementia, they moved to a new facility over year ago, he is more socially isolated because the movement and his malignancy  -He denies depression, but his [name] is concerned about his adjustment issues, during his process to find his new normal. I'll recommend social worker consultation, he declined for now.   Plan -We'll proceed with Nivolumab treatment today and continue every 2 weeks with labs. Delton See today and every 4 weeks. -I'll see him back in 4 weeks. -The patient is scheduled for a CT of the head in January 2018. -Speech therapy referral  All questions were answered. The patient knows to call the clinic with any problems, questions or concerns. No barriers to learning was detected.  I spent 30 minutes counseling the patient face to face. The total time spent in the appointment was 35 minutes and more than 50% was on counseling and review of test results     [name], MD 04/08/16   This document serves as a record of services personally performed by [name], MD. It was created on her behalf by Darcus Austin, a trained medical scribe. The creation of this record is based on the scribe's personal observations and the provider's statements to them. This document has been checked and approved by the attending provider.

## 2016-04-05 NOTE — Telephone Encounter (Signed)
Message sent to chemo scheduler to be added per 04/05/16 los. Lab and follow up appointments was scheduled per 04/05/16 los. Patient's daughter was given a copy of the AVS report and appointment schedule. Patient's daughter stated that her father's MyChart is active and that she will retrieve his updated schedule from his account.

## 2016-04-06 ENCOUNTER — Encounter: Payer: Self-pay | Admitting: *Deleted

## 2016-04-06 ENCOUNTER — Other Ambulatory Visit: Payer: Self-pay | Admitting: *Deleted

## 2016-04-06 ENCOUNTER — Telehealth: Payer: Self-pay | Admitting: *Deleted

## 2016-04-06 DIAGNOSIS — C799 Secondary malignant neoplasm of unspecified site: Secondary | ICD-10-CM

## 2016-04-06 DIAGNOSIS — C439 Malignant melanoma of skin, unspecified: Secondary | ICD-10-CM

## 2016-04-06 MED ORDER — UNABLE TO FIND: 0 refills | Status: DC

## 2016-04-06 NOTE — Progress Notes (Signed)
[place] Work  Clinical Social Work was referred by Futures trader for assessment of psychosocial needs.  Clinical Social Worker contacted patient at home to offer support and assess for needs. CSW had another appointment when he was her for his treatment. CSW introduced self and explained role of CSW/Support Team. [place] educated pt about options for support through [facility]. Pt reports to be feeling better and less depressed recently. He reports to live at [facility] [place] and has access to exercise facilities. He plans to explore exercise as an additional coping technique. CSW inquired about support options through [facility] and he shared that he did see SW there when first diagnosed. He feels that may be more accessible for him. He was interested in our resources and CSW mailing a packet with support resources to review. Pt appreciative and agrees to reach out as needed.       Clinical Social Work interventions: Resource education and referral  Loren Racer, [employer] Worker [place]  [place] Phone: [phone] Fax: [phone]

## 2016-04-06 NOTE — Telephone Encounter (Signed)
Spoke with Gaye Pollack @ Therapy Dept at [facility] for speech therapy referral for pt.   Faxed verbal order to [place]. [facility]   Phone    [phone]     ;     Fax     [phone].

## 2016-04-07 ENCOUNTER — Encounter: Payer: Medicare Other | Admitting: Physical Medicine & Rehabilitation

## 2016-04-08 ENCOUNTER — Encounter: Payer: Self-pay | Admitting: Hematology

## 2016-04-13 DIAGNOSIS — I69320 Aphasia following cerebral infarction: Secondary | ICD-10-CM | POA: Diagnosis not present

## 2016-04-17 DIAGNOSIS — E291 Testicular hypofunction: Secondary | ICD-10-CM | POA: Diagnosis not present

## 2016-04-19 ENCOUNTER — Ambulatory Visit (HOSPITAL_BASED_OUTPATIENT_CLINIC_OR_DEPARTMENT_OTHER): Payer: Medicare Other

## 2016-04-19 ENCOUNTER — Other Ambulatory Visit (HOSPITAL_BASED_OUTPATIENT_CLINIC_OR_DEPARTMENT_OTHER): Payer: Medicare Other

## 2016-04-19 VITALS — BP 126/99 | HR 76 | Temp 98.9°F | Resp 18

## 2016-04-19 DIAGNOSIS — Z5112 Encounter for antineoplastic immunotherapy: Secondary | ICD-10-CM | POA: Diagnosis present

## 2016-04-19 DIAGNOSIS — C439 Malignant melanoma of skin, unspecified: Secondary | ICD-10-CM

## 2016-04-19 DIAGNOSIS — C7951 Secondary malignant neoplasm of bone: Secondary | ICD-10-CM | POA: Diagnosis not present

## 2016-04-19 DIAGNOSIS — C799 Secondary malignant neoplasm of unspecified site: Secondary | ICD-10-CM

## 2016-04-19 DIAGNOSIS — Z79899 Other long term (current) drug therapy: Secondary | ICD-10-CM | POA: Diagnosis not present

## 2016-04-19 LAB — COMPREHENSIVE METABOLIC PANEL
ALT: 23 U/L (ref 0–55)
AST: 22 U/L (ref 5–34)
Albumin: 3.6 g/dL (ref 3.5–5.0)
Alkaline Phosphatase: 62 U/L (ref 40–150)
Anion Gap: 8 mEq/L (ref 3–11)
BILIRUBIN TOTAL: 0.55 mg/dL (ref 0.20–1.20)
BUN: 39.4 mg/dL — AB (ref 7.0–26.0)
CO2: 23 meq/L (ref 22–29)
CREATININE: 1.3 mg/dL (ref 0.7–1.3)
Calcium: 9.4 mg/dL (ref 8.4–10.4)
Chloride: 107 mEq/L (ref 98–109)
EGFR: 54 mL/min/{1.73_m2} — ABNORMAL LOW (ref >90)
GLUCOSE: 80 mg/dL (ref 70–140)
Potassium: 5.4 mEq/L — ABNORMAL HIGH (ref 3.5–5.1)
SODIUM: 138 meq/L (ref 136–145)
TOTAL PROTEIN: 6.9 g/dL (ref 6.4–8.3)

## 2016-04-19 LAB — CBC WITH DIFFERENTIAL/PLATELET
BASO%: 0.5 % (ref 0.0–2.0)
Basophils Absolute: 0 10*3/uL (ref 0.0–0.1)
EOS%: 12 % — ABNORMAL HIGH (ref 0.0–7.0)
Eosinophils Absolute: 0.7 10*3/uL — ABNORMAL HIGH (ref 0.0–0.5)
HCT: 47.9 % (ref 38.4–49.9)
HGB: 16.2 g/dL (ref 13.0–17.1)
LYMPH%: 7.6 % — AB (ref 14.0–49.0)
MCH: 30.3 pg (ref 27.2–33.4)
MCHC: 33.7 g/dL (ref 32.0–36.0)
MCV: 90 fL (ref 79.3–98.0)
MONO#: 0.7 10*3/uL (ref 0.1–0.9)
MONO%: 11.2 % (ref 0.0–14.0)
NEUT%: 68.7 % (ref 39.0–75.0)
NEUTROS ABS: 4.2 10*3/uL (ref 1.5–6.5)
Platelets: 167 10*3/uL (ref 140–400)
RBC: 5.33 10*6/uL (ref 4.20–5.82)
RDW: 15.3 % — ABNORMAL HIGH (ref 11.0–14.6)
WBC: 6.2 10*3/uL (ref 4.0–10.3)
lymph#: 0.5 10*3/uL — ABNORMAL LOW (ref 0.9–3.3)

## 2016-04-19 LAB — TSH: TSH: 0.343 m[IU]/L (ref 0.320–4.118)

## 2016-04-19 MED ORDER — SODIUM CHLORIDE 0.9 % IV SOLN
240.0000 mg | Freq: Once | INTRAVENOUS | Status: AC
  Administered 2016-04-19: 240 mg via INTRAVENOUS
  Filled 2016-04-19: qty 4

## 2016-04-19 MED ORDER — SODIUM CHLORIDE 0.9 % IV SOLN
Freq: Once | INTRAVENOUS | Status: AC
  Administered 2016-04-19: 13:00:00 via INTRAVENOUS

## 2016-04-19 NOTE — Progress Notes (Signed)
Patient blood pressure 126/99. Patient denies dizziness, headache or vision problems. Patient states he forgot to take his dose of blood pressure medication this morning. Potassium also noted to be 5.4. Dr. Burr Medico notified. Patient to take blood pressure medication when returning home and ok to treat with potassium level and blood pressure per Dr. Burr Medico.

## 2016-04-19 NOTE — Patient Instructions (Signed)
[facility] Discharge Instructions for Patients Receiving Chemotherapy  Today you received the following chemotherapy agents Nivolumab.  To help prevent nausea and vomiting after your treatment, we encourage you to take your nausea medication as prescribed.   If you develop nausea and vomiting that is not controlled by your nausea medication, call the clinic.   BELOW ARE SYMPTOMS THAT SHOULD BE REPORTED IMMEDIATELY:  *FEVER GREATER THAN 100.5 F  *CHILLS WITH OR WITHOUT FEVER  NAUSEA AND VOMITING THAT IS NOT CONTROLLED WITH YOUR NAUSEA MEDICATION  *UNUSUAL SHORTNESS OF BREATH  *UNUSUAL BRUISING OR BLEEDING  TENDERNESS IN MOUTH AND THROAT WITH OR WITHOUT PRESENCE OF ULCERS  *URINARY PROBLEMS  *BOWEL PROBLEMS  UNUSUAL RASH Items with * indicate a potential emergency and should be followed up as soon as possible.  Feel free to call the clinic you have any questions or concerns. The clinic phone number is [phone].  Please show the CHEMO ALERT CARD at check-in to the Emergency Department and triage nurse.   

## 2016-04-20 ENCOUNTER — Ambulatory Visit (HOSPITAL_COMMUNITY)
Admission: RE | Admit: 2016-04-20 | Discharge: 2016-04-20 | Disposition: A | Discharge status: Home / Self Care | Payer: Medicare Other | Source: Ambulatory Visit | Attending: Radiation Oncology | Admitting: Radiation Oncology

## 2016-04-20 ENCOUNTER — Encounter (HOSPITAL_COMMUNITY): Payer: Self-pay

## 2016-04-20 DIAGNOSIS — C7949 Secondary malignant neoplasm of other parts of nervous system: Secondary | ICD-10-CM | POA: Diagnosis not present

## 2016-04-20 DIAGNOSIS — C7931 Secondary malignant neoplasm of brain: Secondary | ICD-10-CM | POA: Diagnosis not present

## 2016-04-20 MED ORDER — IOPAMIDOL (ISOVUE-300) INJECTION 61%
INTRAVENOUS | Status: AC
  Administered 2016-04-20: 75 mL
  Filled 2016-04-20: qty 75

## 2016-04-24 ENCOUNTER — Ambulatory Visit
Admission: RE | Admit: 2016-04-24 | Discharge: 2016-04-24 | Disposition: A | Discharge status: Home / Self Care | Payer: Medicare Other | Source: Ambulatory Visit | Attending: Radiation Oncology | Admitting: Radiation Oncology

## 2016-04-24 ENCOUNTER — Encounter: Payer: Self-pay | Admitting: Radiation Oncology

## 2016-04-24 VITALS — BP 137/95 | HR 75 | Temp 97.7°F | Wt 204.8 lb

## 2016-04-24 DIAGNOSIS — K219 Gastro-esophageal reflux disease without esophagitis: Secondary | ICD-10-CM | POA: Insufficient documentation

## 2016-04-24 DIAGNOSIS — I714 Abdominal aortic aneurysm, without rupture: Secondary | ICD-10-CM | POA: Insufficient documentation

## 2016-04-24 DIAGNOSIS — Z950 Presence of cardiac pacemaker: Secondary | ICD-10-CM | POA: Diagnosis not present

## 2016-04-24 DIAGNOSIS — E039 Hypothyroidism, unspecified: Secondary | ICD-10-CM | POA: Diagnosis not present

## 2016-04-24 DIAGNOSIS — C439 Malignant melanoma of skin, unspecified: Secondary | ICD-10-CM | POA: Insufficient documentation

## 2016-04-24 DIAGNOSIS — Z801 Family history of malignant neoplasm of trachea, bronchus and lung: Secondary | ICD-10-CM | POA: Diagnosis not present

## 2016-04-24 DIAGNOSIS — Z87891 Personal history of nicotine dependence: Secondary | ICD-10-CM | POA: Diagnosis not present

## 2016-04-24 DIAGNOSIS — Z951 Presence of aortocoronary bypass graft: Secondary | ICD-10-CM | POA: Diagnosis not present

## 2016-04-24 DIAGNOSIS — Z8673 Personal history of transient ischemic attack (TIA), and cerebral infarction without residual deficits: Secondary | ICD-10-CM | POA: Diagnosis not present

## 2016-04-24 DIAGNOSIS — I2510 Atherosclerotic heart disease of native coronary artery without angina pectoris: Secondary | ICD-10-CM | POA: Diagnosis not present

## 2016-04-24 DIAGNOSIS — Z923 Personal history of irradiation: Secondary | ICD-10-CM | POA: Diagnosis not present

## 2016-04-24 DIAGNOSIS — N400 Enlarged prostate without lower urinary tract symptoms: Secondary | ICD-10-CM | POA: Insufficient documentation

## 2016-04-24 DIAGNOSIS — I252 Old myocardial infarction: Secondary | ICD-10-CM | POA: Insufficient documentation

## 2016-04-24 DIAGNOSIS — C799 Secondary malignant neoplasm of unspecified site: Secondary | ICD-10-CM

## 2016-04-24 DIAGNOSIS — G4730 Sleep apnea, unspecified: Secondary | ICD-10-CM | POA: Insufficient documentation

## 2016-04-24 DIAGNOSIS — C7931 Secondary malignant neoplasm of brain: Secondary | ICD-10-CM

## 2016-04-24 DIAGNOSIS — I10 Essential (primary) hypertension: Secondary | ICD-10-CM | POA: Diagnosis not present

## 2016-04-24 DIAGNOSIS — E7800 Pure hypercholesterolemia, unspecified: Secondary | ICD-10-CM | POA: Diagnosis not present

## 2016-04-24 DIAGNOSIS — I700 Atherosclerosis of aorta: Secondary | ICD-10-CM | POA: Diagnosis not present

## 2016-04-24 DIAGNOSIS — Z888 Allergy status to other drugs, medicaments and biological substances status: Secondary | ICD-10-CM | POA: Insufficient documentation

## 2016-04-24 DIAGNOSIS — E875 Hyperkalemia: Secondary | ICD-10-CM | POA: Diagnosis not present

## 2016-04-24 DIAGNOSIS — Z9889 Other specified postprocedural states: Secondary | ICD-10-CM | POA: Insufficient documentation

## 2016-04-24 DIAGNOSIS — Z79899 Other long term (current) drug therapy: Secondary | ICD-10-CM | POA: Insufficient documentation

## 2016-04-24 DIAGNOSIS — R21 Rash and other nonspecific skin eruption: Secondary | ICD-10-CM | POA: Insufficient documentation

## 2016-04-24 DIAGNOSIS — Z8249 Family history of ischemic heart disease and other diseases of the circulatory system: Secondary | ICD-10-CM | POA: Insufficient documentation

## 2016-04-24 DIAGNOSIS — Z8261 Family history of arthritis: Secondary | ICD-10-CM | POA: Insufficient documentation

## 2016-04-24 DIAGNOSIS — I255 Ischemic cardiomyopathy: Secondary | ICD-10-CM | POA: Diagnosis not present

## 2016-04-24 DIAGNOSIS — M159 Polyosteoarthritis, unspecified: Secondary | ICD-10-CM | POA: Diagnosis not present

## 2016-04-24 DIAGNOSIS — Z95810 Presence of automatic (implantable) cardiac defibrillator: Secondary | ICD-10-CM | POA: Diagnosis not present

## 2016-04-24 DIAGNOSIS — I25118 Atherosclerotic heart disease of native coronary artery with other forms of angina pectoris: Secondary | ICD-10-CM | POA: Insufficient documentation

## 2016-04-24 DIAGNOSIS — Z4502 Encounter for adjustment and management of automatic implantable cardiac defibrillator: Secondary | ICD-10-CM | POA: Diagnosis not present

## 2016-04-24 DIAGNOSIS — Z7689 Persons encountering health services in other specified circumstances: Secondary | ICD-10-CM | POA: Diagnosis not present

## 2016-04-24 NOTE — Progress Notes (Signed)
Radiation Oncology         [phone]) [phone] ________________________________  Name: David [name] MRN: [id number]  Date: 04/24/2016  DOB: 07-10-1940  Follow-Up Visit Note  CC: [name], MD  [name], MD  Diagnosis:   76 yo man with a new 6 mm right cerebellar brain met from melanoma    ICD-9-CM ICD-10-CM   1. Metastatic melanoma (HCC) 172.9 C79.9   2. Metastasis to brain (HCC) 198.3 C79.31     Interval Since Last Radiation: 3 months  01/27/16 SRS Treatment:  20 Gy in 1 fraction to a 6 mm right cerebellar brain met   10/13/2015 Preop SRS treatment: 14 Gy  to the left frontal lesion in 1 fraction  Narrative:  In summary this is a pleasant 76 y.o. gentleman with a history of metastatic melanoma treated on two occasions with SRS to the brain, currently undergoing nivolimab with Dr. Burr Medico for systemic treatment of his disease. He underwent a CT brain on 04/20/16 revealing stability of post treatment site. No new disease was noted. In the interval since his last visit, he did develop more noted hypothyroidism and this is felt to be due to his immunotherapy. His primary provider has increased as levothyroxine.  On review of systems, the patient reports that he is doing well overall. In fact since his hypothyroidism has been treated with higher doses of levothyroxine. He denies any chest pain, shortness of breath, cough, fevers, chills, night sweats, unintended weight changes. He denies any bowel or bladder disturbances, and denies abdominal pain, nausea or vomiting. He denies any new musculoskeletal or joint aches or pains. He again reports difficulty remembering names, reports weak muscle strength upper and lower extremities. Patient reports negative for visual blurring, double vision, eye pain,  ringing in bilateral ears. Patient presenting appropriate quality, quantity, and organization of sentences. Patient denies headaches. Skin is warm, dry, and intact. A complete review of systems is  obtained and is otherwise negative.  Past Medical History:  Past Medical History:  Diagnosis Date  . Anginal pain ([place])   . Arthritis    mostly hands  . Benign familial hematuria   . BPH (benign prostatic hypertrophy)   . Brain cancer ([place])    melanoma with brain met  . Coronary artery disease   . GERD (gastroesophageal reflux disease)   . High cholesterol   . Hypertension   . Hypothyroidism   . Intracerebral hemorrhage ([place])   . Melanoma ([place])   . Metastatic melanoma to lung ([place])   . Mood disorder ([place])   . Myocardial infarction   . Pacemaker    due to syncope, 3rd degree HB (upgrade to [facility]. Jude CRT-P 02/25/13 (Dr. Uvaldo Rising)  . Rectal bleed    due to NSAIDS  . Renal disorder   . Skin cancer    melanoma  . Sleep apnea    uses CPAP  . Stroke [facility])     Past Surgical History: Past Surgical History:  Procedure Laterality Date  . APPLICATION OF CRANIAL NAVIGATION N/A 10/15/2015   Procedure: APPLICATION OF CRANIAL NAVIGATION;  Surgeon: Erline Levine, MD;  Location: [place] NEURO ORS;  Service: Neurosurgery;  Laterality: N/A;  . CARDIAC CATHETERIZATION  2013  . CARDIAC SURGERY     bypass X 2  . CORONARY ARTERY BYPASS GRAFT  2013   LIMA-LAD, SVG-PDA 03/27/11 (Dr. Francee Gentile)  . CRANIOTOMY N/A 10/15/2015   Procedure: LEFT FRONTAL CRANIOTOMY TUMOR EXCISION with Curve;  Surgeon: Erline Levine, MD;  Location: [place] NEURO ORS;  Service: Neurosurgery;  Laterality: N/A;  CRANIOTOMY TUMOR EXCISION  . EYE SURGERY    . FOOT SURGERY  08/2010  . JOINT REPLACEMENT Left    partial knee  . KNEE ARTHROSCOPY  12/2008  . LYMPH NODE BIOPSY    . PACEMAKER INSERTION    . TONSILLECTOMY      Social History:  Social History   Social History  . Marital status: Married    Spouse name: N/A  . Number of children: 2  . Years of education: N/A   Occupational History  . retired Pharmacist, community    Social History Main Topics  . Smoking status: Former Smoker    Packs/day: 1.00    Years: 3.00    Quit date: 04/11/1919   . Smokeless tobacco: Never Used  . Alcohol use 2.4 oz/week    4 Glasses of wine per week     Comment: daily for the past 10 years - last few weeks none  . Drug use: No  . Sexual activity: No   Other Topics Concern  . Not on file   Social History Narrative   Wife has dementia   Daughter [name] and son      Has living will   [name] is health care POA   Requests DNR-- done 09/14/15   Not sure about tube feeds    Family History: Family History  Problem Relation Age of Onset  . Cancer Mother 80    lung   . Hypertension Mother   . Hypertension Father   . Heart attack Father   . Heart disease Father   . Rheum arthritis Sister               ALLERGIES:  is allergic to zocor [simvastatin].  Meds: Current Outpatient Prescriptions  Medication Sig Dispense Refill  . acetaminophen (TYLENOL) 325 MG tablet Take 2 tablets (650 mg total) by mouth every 6 (six) hours as needed for mild pain. (Patient taking differently: Take 650 mg by mouth 3 (three) times daily. )    . carvedilol (COREG) 6.25 MG tablet TK 1 T PO  BID  0  . levothyroxine (SYNTHROID, LEVOTHROID) 125 MCG tablet TAKE 1 TABLET BY MOUTH EVERY MORNING BEFORE BREAKFAST (Patient taking differently: Take  175 mcg  daily) 30 tablet 11  . Multiple Vitamin (MULTIVITAMIN WITH MINERALS) TABS tablet Take 1 tablet by mouth daily.    Marland Kitchen PRESCRIPTION MEDICATION Inhale into the lungs at bedtime. CPAP    . PREVNAR 13 SUSP injection ADM 0.5ML IM UTD  0  . rosuvastatin (CRESTOR) 10 MG tablet Take 10 mg by mouth at bedtime.    . sertraline (ZOLOFT) 50 MG tablet Take 2.5 tablets (125 mg total) by mouth daily. (Patient taking differently: Take 100 mg by mouth daily. )    . Turmeric Curcumin 500 MG CAPS Take 1,000 mg by mouth 2 (two) times daily.     Marland Kitchen UNABLE TO FIND Speech Therapy -  Please evaluate pt for  Partial  Aphasia and  Treat 1 Units 0   No current facility-administered medications for this encounter.     Facility-Administered Medications Ordered in Other Encounters  Medication Dose Route Frequency Provider Last Rate Last Dose  . 0.9 %  sodium chloride infusion  1,000 mL Intravenous Once [name], MD        Physical Findings:   weight is 204 lb 12.8 oz (92.9 kg). His oral temperature is 97.7 F (36.5 C). His blood pressure is  137/95 (abnormal) and his pulse is 75. His oxygen saturation is 96%. .  No significant changes. In general this is a well appearing caucasian male in no acute distress. He's alert and oriented x4 and appropriate throughout the examination. Cardiopulmonary assessment is negative for acute distress and he exhibits normal effort. HEENT reveals that he is normocephalic, atraumatic, EOMs are intact. He has erythematous changes along the nose bridge and nasal labia folds. This is without infection or cellulitis. Maculopapular rash also noted of bilateral upper extremities associated with immunotherapy.  Lab Findings: Lab Results  Component Value Date   WBC 6.2 04/19/2016   WBC 5.9 10/13/2015   HGB 16.2 04/19/2016   HCT 47.9 04/19/2016   PLT 167 04/19/2016    Lab Results  Component Value Date   NA 138 04/19/2016   K 5.4 No visable hemolysis (H) 04/19/2016   CHLORIDE 107 04/19/2016   CO2 23 04/19/2016   GLUCOSE 80 04/19/2016   BUN 39.4 (H) 04/19/2016   CREATININE 1.3 04/19/2016   BILITOT 0.55 04/19/2016   ALKPHOS 62 04/19/2016   AST 22 04/19/2016   ALT 23 04/19/2016   PROT 6.9 04/19/2016   ALBUMIN 3.6 04/19/2016   CALCIUM 9.4 04/19/2016   ANIONGAP 8 04/19/2016   ANIONGAP 8 10/13/2015    Radiographic Findings: Ct Head W Wo Contrast  Result Date: 04/20/2016 CLINICAL DATA:  76 year old male status post neuro surgery and stereotactic radiation treatment of metastatic melanoma to the brain. Restaging. Pacemaker, unable to have MRI. Subsequent encounter. EXAM: CT HEAD WITHOUT AND WITH CONTRAST TECHNIQUE: Contiguous axial images were obtained from the base of the  skull through the vertex without and with intravenous contrast CONTRAST:  43m ISOVUE-300 IOPAMIDOL (ISOVUE-300) INJECTION 61% COMPARISON:  PET-CT 03/29/2016. Postoperative head CT 10/16/2015. Head CTs without and with contrast 10/16/2015 and earlier FINDINGS: Brain: Mild postoperative encephalomalacia in the anterior left frontal lobe with mild ex vacuo enlargement of the left frontal horn. No regional mass effect. Following contrast there is patchy enhancement extending from the dura to the subependyma of the left frontal horn (series 11, image 39). Surrounding white matter hypodensity, but not strongly suggestive of vasogenic edema. No other abnormal intracranial enhancement. Elsewhere gray-white matter differentiation remains normal. No acute intracranial hemorrhage identified. No cortically based acute infarct identified. No ventriculomegaly. Vascular: Major intracranial vascular structures are normally enhancing. Mild Calcified atherosclerosis at the skull base. Skull: Sequelae of left frontotemporal craniotomy with no adverse features. Stable 14 mm lucent lesion along the left parietal bone since May 2017, suggesting benign etiology (series 3, image 105). This is located just cephalad to the left lambdoid suture. No new or suspicious osseous lesion. Sinuses/Orbits: Visualized paranasal sinuses and mastoids are stable and well pneumatized. Other: Postoperative soft tissue changes along the left frontal convexity. Postoperative changes to both globes, more so the left. No acute orbit or scalp soft tissue finding identified. IMPRESSION: 1. Mild patchy enhancement at the left anterior frontal lobe treatment site (series 11, image 39) without mass effect and stable surrounding white matter hypodensity without strong evidence of acute vasogenic edema. Recommend continued CT surveillance. 2. No new metastatic disease or new intracranial abnormality identified. 3. Probable benign 14 mm left parietal bone lucent  lesion, unchanged since May 2017. Electronically Signed   By: HGenevie AnnM.D.   On: 04/20/2016 13:43   Nm Pet Image Restage (ps) Whole Body  Result Date: 03/30/2016 CLINICAL DATA:  Subsequent treatment strategy for melanoma. EXAM: NUCLEAR MEDICINE PET WHOLE BODY TECHNIQUE: 9.83  mCi F-18 FDG was injected intravenously. Full-ring PET imaging was performed from the vertex to the feet after the radiotracer. CT data was obtained and used for attenuation correction and anatomic localization. FASTING BLOOD GLUCOSE:  Value: 99 mg/dl COMPARISON:  12/23/2015 FINDINGS: NECK Asymmetric area of low attenuation within the anterior right masseter muscle measures 2 x 1.4 cm. Increased uptake within this area has an SUV max equal to 14.4. On the previous exam this had an SUV max of 8.16. The index right subclavicular paratracheal nodule measures 1.3 cm and has an SUV max equal to 3.7. On the previous exam this measured 1.3 cm and had an SUV max equal to 6.9. On the left nodule measures 1.2 cm and has an SUV max equal to 3.16. Previously 1.7 cm with an SUV max of 7.7. CHEST The heart size is enlarged. Previous median sternotomy and CABG procedure. No pericardial effusion identified. The trachea appears patent and is midline. Normal appearance of the esophagus. No hypermetabolic mediastinal or hilar lymph nodes. No hypermetabolic axillary or supraclavicular adenopathy. There is no pleural effusion identified. Pulmonary nodule within the lingula measures 1 cm, image 45 of series 8. SUV max associated with this nodule is equal to 1.85. Previously this nodule measured 1.2 cm and had an SUV max equal to 5.8. 4 mm right upper lobe lung nodule is identified. This is too small to reliably characterize, image number 23 of series 8 but is unchanged from previous exam. ABDOMEN/PELVIS No abnormal hypermetabolic activity within the liver, pancreas, adrenal glands, or spleen. Aortic atherosclerosis is identified. Infrarenal abdominal aortic  aneurysm measures 3.2 cm, image 183 of series 3. No hypermetabolic lymph nodes in the abdomen or pelvis. SKELETON Right iliac crest hypermetabolic lesion has an SUV max equal to 4.18. Previously 4.74. Hypermetabolic lesion within the proximal right humerus has an SUV max equal to 3.12. Previously 6.3. IMPRESSION: 1. Overall there has been an interval response to therapy. The index lesions sided on previous exam it are less hypermetabolic when compared with the previous exam. 2. There is an intramuscular lesion within the right masseter which has increased FDG uptake compared with previous exam. 3. No new sites of disease identified. 4. Aortic atherosclerosis and infrarenal abdominal aortic aneurysm. Recommend followup by ultrasound in 3 years. This recommendation follows ACR consensus guidelines: White Paper of the ACR Incidental Findings Committee II on Vascular Findings. [facility] Radiol 2013; 10:[id number] Electronically Signed   By: Kerby Moors M.D.   On: 03/30/2016 09:00    Impression/Plan: 1. Metastatic Melanoma. The patient appears to be doing well and tolerating his immunotherapy at this time, and he will continue under Dr. Ernestina Penna recommendations. We reviewed his CT scan imaging and results, and despite the limitations of CT versus MRI scan, the patient's images appear to be stable if not slightly improved. We will continue to recommend imaging every 3 months. Patient will call with questions prior to his next visit.  2. Rash. The patient's rash on his nasolabial folds are stable, and are attributed to his immunotherapy. We will continue to follow this expectantly. 3. Hypothyroidism. Again his primary provider's closely following this as it is likely a result of his immunotherapy. Fortunately it appears that clinically he is doing much better since adjustments have been made in his medication. 4. Desired to resume exercise. The patient was given information for with strong program, as well as a  referral sheet was filled out. We will continue to follow this expectantly.  The above  documentation reflects my direct findings during this shared patient visit. Please see the separate note by Dr. Tammi Klippel on this date for the remainder of the patient's plan of care.     Carola Rhine, PAC    This document serves as a record of services personally performed by [name], PA and Tyler Pita, MD. It was created on his behalf by Bethann Humble, a trained medical scribe. The creation of this record is based on the scribe's personal observations and the provider's statements to them. This document has been checked and approved by the attending provider.

## 2016-04-24 NOTE — Progress Notes (Signed)
PAIN: He is currently in no pain.  NEURO: Pt alert & oriented x 3 with fluent speech, reports weak muscle strength upper and lower extremities. Pt reports negative for visual blurring, double vision, eye pain PERRLA, ringing in bilateral ears. Pt presenting appropriate quality, quantity and organization of sentences. Pt denies headaches. SKIN: Warm dry and intact.  OTHER: Pt complains of weakness.  BP (!) 137/95   Pulse 75   Temp 97.7 F (36.5 C) (Oral)   Wt 204 lb 12.8 oz (92.9 kg)   SpO2 96%   BMI 30.24 kg/m  Wt Readings from Last 3 Encounters:  04/24/16 204 lb 12.8 oz (92.9 kg)  04/05/16 204 lb (92.5 kg)  03/06/16 207 lb 2 oz (94 kg)

## 2016-05-01 DIAGNOSIS — Z79899 Other long term (current) drug therapy: Secondary | ICD-10-CM | POA: Diagnosis not present

## 2016-05-01 DIAGNOSIS — Z45018 Encounter for adjustment and management of other part of cardiac pacemaker: Secondary | ICD-10-CM | POA: Diagnosis not present

## 2016-05-01 DIAGNOSIS — I498 Other specified cardiac arrhythmias: Secondary | ICD-10-CM | POA: Diagnosis not present

## 2016-05-01 DIAGNOSIS — C7989 Secondary malignant neoplasm of other specified sites: Secondary | ICD-10-CM | POA: Diagnosis not present

## 2016-05-01 DIAGNOSIS — I10 Essential (primary) hypertension: Secondary | ICD-10-CM | POA: Diagnosis not present

## 2016-05-01 DIAGNOSIS — E291 Testicular hypofunction: Secondary | ICD-10-CM | POA: Diagnosis not present

## 2016-05-01 DIAGNOSIS — E039 Hypothyroidism, unspecified: Secondary | ICD-10-CM | POA: Diagnosis not present

## 2016-05-01 DIAGNOSIS — F329 Major depressive disorder, single episode, unspecified: Secondary | ICD-10-CM | POA: Diagnosis not present

## 2016-05-01 DIAGNOSIS — R5383 Other fatigue: Secondary | ICD-10-CM | POA: Diagnosis not present

## 2016-05-02 NOTE — Progress Notes (Signed)
[facility]  [phone] [phone]  Clinic Follow up Note   Patient Care Team: Stephen Ruehle, MD as PCP - General (Internal Medicine)   Chief complaint: Follow-up metastatic melanoma  Oncology History   Metastatic melanoma (HCC)   Staging form: Melanoma of the Skin, AJCC 7th Edition     Clinical stage from 09/21/2015: Stage IV (TX, NX, M1c) - Signed by  , MD on 10/09/2015       Metastatic melanoma (HCC)   09/16/2015 Imaging    PET scan showed a hypermetabolic 2.5cm lingular nodule, and a 6mm right upper lobe lung nodule, left subclavicular nodes, and bone metastasis in the proximal right humerus and right femur.       09/21/2015 Initial Diagnosis    Metastatic melanoma (HCC)      09/21/2015 Initial Biopsy    Left subclavicular lymph node biopsy showed prostatic of melanoma      09/21/2015 Miscellaneous    BRAF V600K mutation (+)       10/04/2015 Imaging    CT head with without contrast showed a 2.0 x 1.6 x 1.7 cm superficial left frontal hyperdense lesion, with postcontrast enhancement, lying within the previous hemorrhagic area, concerning for solitary melanoma metastasis      10/06/2015 -  Chemotherapy    Nivolumab 240mg every 2 weeks       10/13/2015 - 10/13/2015 Radiation Therapy    10/13/2015 Preop SRS treatment: 14 Gy  to the left frontal lesion in 1 fraction      10/15/2015 Surgery    left front craniotomy and tumor excision       10/15/2015 Pathology Results    left frontal brain mass excision showed metastatic melanoma, 1.5X1.5X0.6cm      01/13/2016 Imaging    CT head without and with contrast showed ill-defined enhancement and dural thickening of the left frontal resection cavity may represent a post treatment changes or residual neoplasm. A new 6 mm hypodensity in the right cerebellar hemisphere with uncertainty enhancement may represent a metastasis or small brain parenchymal hemorrhage.       01/27/2016 - 01/27/2016  Radiation Therapy    01/27/16 SRS Treatment:  20 Gy in 1 fraction to a 6 mm right cerebellar brain met       03/29/2016 PET scan    PET 03/29/2016 IMPRESSION: 1. Overall there has been an interval response to therapy. The index lesions sided on previous exam it are less hypermetabolic when compared with the previous exam. 2. There is an intramuscular lesion within the right masseter which has increased FDG uptake compared with previous exam. 3. No new sites of disease identified. 4. Aortic atherosclerosis and infrarenal abdominal aortic aneurysm. Recommend followup by ultrasound in 3 years. This recommendation follows ACR consensus guidelines: White Paper of the ACR Incidental Findings Committee II on Vascular Findings. J Am Coll Radiol 2013; 10:789-794      04/20/2016 Imaging    CT Head w wo contrast 1. Mild patchy enhancement at the left anterior frontal lobe treatment site (series 11, image 39) without mass effect and stable surrounding white matter hypodensity without strong evidence of acute vasogenic edema. Recommend continued CT surveillance. 2. No new metastatic disease or new intracranial abnormality identified. 3. Probable benign 14 mm left parietal bone lucent lesion, unchanged since May 2017.       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 He was found to have left frontal hemorrhage 5.7 cm x 4.5 cm with 6 mm left to right shift.During the workup for his stroke, a left upper lung mass was noticed on the CT scan. I was called to evaluate his lung mass.  Patient has aphasia, not able to communicate much. According to his daughter, Dr. Leggett, who is a OB GYN at [employer], he probably only smoked for very short period time during the college. He did have 3 skin melanoma, which was removed, last one was 2-3 years ago. No other cancer  history.  CURRENT THERAPY: Nivolumab 240 mg, every 2 weeks, started on 10/06/2015  INTERVAL HISTORY: David [name] returns for follow-up and nivolumab therapy. He has been walking on the treadmill at the gym about three or four times weekly. He saw a speech therapist and was told his speech was okay. He drinks about 8 full glasses of water daily.   REVIEW OF SYSTEMS:  Constitutional: Denies fevers, chills or abnormal weight loss Eyes: Denies blurriness of vision Ears, nose, mouth, throat, and face: Denies mucositis or sore throat Respiratory: Denies cough, dyspnea or wheezes Cardiovascular: Denies palpitation, chest discomfort or lower extremity swelling Gastrointestinal:  Denies nausea, heartburn or change in bowel habits Skin: Denies abnormal skin rashes Lymphatics: Denies new lymphadenopathy or easy bruising Neurological:see HPI  Behavioral/Psych: Mood is stable, no new changes  All other systems were reviewed with the patient and are negative.  MEDICAL HISTORY:  Past Medical History:  Diagnosis Date  . Anginal pain (HCC)   . Arthritis    mostly hands  . Benign familial hematuria   . BPH (benign prostatic hypertrophy)   . Brain cancer (HCC)    melanoma with brain met  . Coronary artery disease   . GERD (gastroesophageal reflux disease)   . High cholesterol   . Hypertension   . Hypothyroidism   . Intracerebral hemorrhage (HCC)   . Melanoma (HCC)   . Metastatic melanoma to lung (HCC)   . Mood disorder (HCC)   . Myocardial infarction   . Pacemaker    due to syncope, 3rd degree HB (upgrade to [employer] CRT-P 02/25/13 (Dr. Boyce)  . Rectal bleed    due to NSAIDS  . Renal disorder   . Skin cancer    melanoma  . Sleep apnea    uses CPAP  . Stroke (HCC)     SURGICAL HISTORY: Past Surgical History:  Procedure Laterality Date  . APPLICATION OF CRANIAL NAVIGATION N/A 10/15/2015   Procedure: APPLICATION OF CRANIAL NAVIGATION;  Surgeon: Joseph Stern, MD;  Location: [facility];  Service: Neurosurgery;  Laterality: N/A;  . CARDIAC CATHETERIZATION  2013  . CARDIAC SURGERY     bypass X 2  . CORONARY ARTERY BYPASS GRAFT  2013   LIMA-LAD, SVG-PDA 03/27/11 (Dr. Krahnert)  . CRANIOTOMY N/A 10/15/2015   Procedure: LEFT FRONTAL CRANIOTOMY TUMOR EXCISION with Curve;  Surgeon: Joseph Stern, MD;  Location: [facility];  Service: Neurosurgery;  Laterality: N/A;  CRANIOTOMY TUMOR EXCISION  . EYE SURGERY    . FOOT SURGERY  08/2010  . JOINT REPLACEMENT Left    partial knee  . KNEE ARTHROSCOPY  12/2008  . LYMPH NODE BIOPSY    . PACEMAKER INSERTION    . TONSILLECTOMY      I have reviewed the social history and family history with the patient and they are unchanged from previous note.  ALLERGIES:  is allergic to zocor [simvastatin].    MEDICATIONS:  Current Outpatient Prescriptions  Medication Sig Dispense Refill  . acetaminophen (TYLENOL) 325 MG tablet Take 2 tablets (650 mg total) by mouth every 6 (six) hours as needed for mild pain. (Patient taking differently: Take 650 mg by mouth 3 (three) times daily. )    . carvedilol (COREG) 6.25 MG tablet TK 1 T PO  BID  0  . levothyroxine (SYNTHROID, LEVOTHROID) 125 MCG tablet TAKE 1 TABLET BY MOUTH EVERY MORNING BEFORE BREAKFAST (Patient taking differently: Take  175 mcg  daily) 30 tablet 11  . Multiple Vitamin (MULTIVITAMIN WITH MINERALS) TABS tablet Take 1 tablet by mouth daily.    Marland Kitchen PRESCRIPTION MEDICATION Inhale into the lungs at bedtime. CPAP    . PREVNAR 13 SUSP injection ADM 0.5ML IM UTD  0  . rosuvastatin (CRESTOR) 10 MG tablet Take 10 mg by mouth at bedtime.    . sertraline (ZOLOFT) 50 MG tablet Take 2.5 tablets (125 mg total) by mouth daily. (Patient taking differently: Take 100 mg by mouth daily. )    . Turmeric Curcumin 500 MG CAPS Take 1,000 mg by mouth 2 (two) times daily.     Marland Kitchen UNABLE TO FIND Speech Therapy -  Please evaluate pt for  Partial  Aphasia and  Treat 1 Units 0   No current facility-administered  medications for this visit.    Facility-Administered Medications Ordered in Other Visits  Medication Dose Route Frequency Provider Last Rate Last Dose  . 0.9 %  sodium chloride infusion  1,000 mL Intravenous Once [name], MD        PHYSICAL EXAMINATION: ECOG PERFORMANCE STATUS: 2 Vitals:   05/03/16 1154  BP: (!) 130/97  Pulse: 82  Resp: 18  TempSrc: Oral  SpO2: 95%  Weight: 207 lb 4.8 oz (94 kg)  Height: 5' 9" (1.753 m)   GENERAL:alert, no distress and comfortable SKIN: skin color, texture, turgor are normal, no rashes or significant lesions EYES: normal, Conjunctiva are pink and non-injected, sclera clear OROPHARYNX:no exudate, no erythema and lips, buccal mucosa, and tongue normal  NECK: supple, thyroid normal size, non-tender, without nodularity LYMPH:  no palpable lymphadenopathy in the cervical, [place], axillary or inguinal LUNGS: clear to auscultation and percussion with normal breathing effort HEART: regular rate & rhythm and no murmurs and no lower extremity edema ABDOMEN:abdomen soft, non-tender and normal bowel sounds Musculoskeletal:no cyanosis of digits and no clubbing  NEURO: alert & oriented x 3 with fluent speech, no focal motor/sensory deficits  LABORATORY DATA:  I have reviewed the data as listed CBC Latest Ref Rng & Units 05/03/2016 04/19/2016 04/05/2016  WBC 4.0 - 10.3 10e3/uL 5.1 6.2 6.1  Hemoglobin 13.0 - 17.1 g/dL 15.5 16.2 15.7  Hematocrit 38.4 - 49.9 % 44.9 47.9 46.3  Platelets 140 - 400 10e3/uL 136(L) 167 164     CMP Latest Ref Rng & Units 05/03/2016 04/19/2016 04/05/2016  Glucose 70 - 140 mg/dl 97 80 176(H)  BUN 7.0 - 26.0 mg/dL 37.1(H) 39.4(H) 32.5(H)  Creatinine 0.7 - 1.3 mg/dL 1.3 1.3 1.4(H)  Sodium 136 - 145 mEq/L 139 138 139  Potassium 3.5 - 5.1 mEq/L 5.3(H) 5.4 No visable hemolysis(H) 5.3(H)  Chloride 101 - 111 mmol/L - - -  CO2 22 - 29 mEq/L 20(L) 23 20(L)  Calcium 8.4 - 10.4 mg/dL 9.2 9.4 9.1  Total Protein 6.4 - 8.3 g/dL 6.4 6.9 6.7    Total Bilirubin 0.20 - 1.20 mg/dL 0.53 0.55 0.36  Alkaline Phos 40 - 150 U/L 64 62  65  AST 5 - 34 U/L 19 22 20  ALT 0 - 55 U/L 16 23 17   TSH  Results for [name], David (MRN [id number]) as of 05/03/2016 12:09  Ref. Range 03/22/2016 07:49 04/05/2016 12:24 04/19/2016 11:40  TSH Latest Ref Range: 0.320 - 4.118 m(IU)/L 3.802 0.260 (L) 0.343    PATHOLOGY REPORT: Diagnosis 09/21/2015 Lymph node, needle/core biopsy, hypermetabolic left sub clavicular LN - METASTATIC MALIGNANT MELANOMA. - SEE COMMENT. Microscopic Comment The case was discussed with Dr. Shadad on 09/22/15. Additional studies can be performed upon clinician request. (JBK:gt, 09/22/15)  Diagnosis 10/15/2015 Brain, for tumor resection, Left frontal - METASTATIC MELANOMA. Microscopic Comment The tumor is a high grade malignancy with frequent mitotic figures and focal intracytoplasmic pigment. Immunohistochemistry shows strong positivity with S-100, HMB45 and Melan-A. (JDP:ecj 10/18/2015)      RADIOGRAPHIC STUDIES: I have personally reviewed the radiological images as listed and agreed with the findings in the report.  CT Head w wo contrast 04/20/2016 IMPRESSION: 1. Mild patchy enhancement at the left anterior frontal lobe treatment site (series 11, image 39) without mass effect and stable surrounding white matter hypodensity without strong evidence of acute vasogenic edema. Recommend continued CT surveillance. 2. No new metastatic disease or new intracranial abnormality identified. 3. Probable benign 14 mm left parietal bone lucent lesion, unchanged since May 2017.  PET 03/29/2016 IMPRESSION: 1. Overall there has been an interval response to therapy. The index lesions sided on previous exam it are less hypermetabolic when compared with the previous exam. 2. There is an intramuscular lesion within the right masseter which has increased FDG uptake compared with previous exam. 3. No new sites of disease identified. 4.  Aortic atherosclerosis and infrarenal abdominal aortic aneurysm. Recommend followup by ultrasound in 3 years. This recommendation follows ACR consensus guidelines: White Paper of the ACR Incidental Findings Committee II on Vascular Findings. J Am Coll Radiol 2013; 10:789-794  CT head w wo contrast 01/13/2016 IMPRESSION: 1. Ill-defined enhancement and dural thickening of the left frontal resection cavity may represent posttreatment changes or residual/recurrent neoplasm. Follow-up is recommended. 2. New 6 mm hyperdensity in right cerebellar hemisphere with uncertain enhancement may represent a metastasis or small brain parenchymal hemorrhage. 3. Mild interval dilatation of the lateral and third ventricles and resolution of left-to-right midline shift probably due to resolving left hemisphere edema and ex vacuo dilatation. These results will be called to the ordering clinician or representative by the Radiologist Assistant, and communication documented in the PACS or zVision Dashboard.  PET 12/23/2015 IMPRESSION: Improved PET-CT when compared to prior study. Some lesions have resolved and other lesions are smaller. This would suggest a good response to therapy. No new lesions/disease.  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   1. Metastatic melanoma to lung, node, bone and brain, BRAF V600K mutation (+) -I previously  reviewed his PET/CT scan images with pt and his family members. The scan revealed hypermetabolic left upper lobe lung mass, 2 small right lung nodules, probable bone metastasis, and a subcutaneous left supraclavicular lesion, which is not palpable  -His repeat his CT had showed a 2.0 cm left frontal hyperdense lesion with enhancement on contrast, in the area of his recent hemorrhagic stroke, highly suspicious for brain metastasis.    -We previously discussed his left supraclavicular node biopsy results, which showed metastatic melanoma.. -His case was discussed in our sarcoma tumor   -  I previously recommend first-line treatment with Nivolumab alone, given his recent stroke, low tumor burden. I'll reserve BRAF inhibitor with or without MEK inhibitor as a second line therapy if he progress on immunotherapy. Other options of immunotherapy, especially checkpoint inhibitor with ipilumimab, were also discussed with pt and his daughter. We previously reviewed his restaging PET scan images in person with patient and his daughter, he had excellent partial response, especially the lung lesion and subcutaneous nodules. No other new lesions. -We previously discussed his restaging PET from 03/29/16 and it showed interval response to therapy, however there is an intramuscular lesion in the right masseter which has increased FDG uptake. No other new lesion. He is clinically doing well, I do not think he we need to change his systemic therapy at this point, but I'll obtain a close follow-up PET scan next month to rule out disease progression. -We will continue Nivolumab every 2 weeks  2. Hemorrhagic stroke and brain metastasis  -I encouraged him to continue physical therapy and occupational therapy. He has recovered  very well -History post SBRT and surgical resection of his solitary brain metastasis  -He will continue following with Dr. Vertell Limber and Dr. Tammi Klippel. -I encouraged the patient to do puzzles and mind games to improve his brain function and memory. -He still has residual aphasia, I'll refer him to speech therapist again.  3. Fatigue -his fatigue has much improved after he restarted testosterone injection -I encouraged him to gradually increase his activity and exercise  4. Hypothyroidism  -He had baseline hypothyroidism before Nivo, but his TSH level has been fluctuating since he started Noivo -he sees his primary care physician Dr.  Asher Muir every 2 weeks, who will adjust his Synthroid as needed   5. HTN -his BP has been high lately both at home and here  -His lisinopril was held during his stroke. I previously recommended him to resume lisinopril  -He will follow-up with his primary care physician  6. Kidney Function -The patient's Cr has recently become elevated at 1.4-1.6. Creatinine is 1.3 today. -I advised him to cut back on caffeine and drink more water.  7. ? Depression vs adjustment issue -Due to his wife's dementia, they moved to a new facility over year ago, he is more socially isolated because the movement and his malignancy  -He denies depression, but his [name] is concerned about his adjustment issues, during his process to find his new normal. I'll recommend social worker consultation, he declined for now. -he states he feels better overall lately, has started going to Gym three times a week, I encouraged him to continue exercise, and be more active  8. Goal of care discussion  -We again discussed the incurable nature of his cancer, and the overall poor prognosis, especially if he does not have good response to chemotherapy or progress on chemo -The patient understands the goal of care is palliative. -I recommend DNR/DNI, he will think about it   Plan -We'll proceed with Nivolumab treatment today and continue every 2 weeks with labs. Delton See today and every 4 weeks. -Restaging PET in 4 weeks before our next visit -I'll see him back in 4 weeks to discuss PET results  All questions were answered. The patient knows to call the clinic with any problems, questions or concerns. No barriers to learning was detected.  I spent 20 minutes counseling the patient face to face. The total time spent in the appointment was 25 minutes and more than 50% was on counseling and  review of test results  This document serves as a record of services personally performed by  , MD. It was created on her behalf by  Elizabeth Ashley, a trained medical scribe. The creation of this record is based on the scribe's personal observations and the provider's statements to them. This document has been checked and approved by the attending provider.     , , MD 05/03/16    

## 2016-05-03 ENCOUNTER — Ambulatory Visit (HOSPITAL_BASED_OUTPATIENT_CLINIC_OR_DEPARTMENT_OTHER): Payer: Medicare Other

## 2016-05-03 ENCOUNTER — Other Ambulatory Visit (HOSPITAL_BASED_OUTPATIENT_CLINIC_OR_DEPARTMENT_OTHER): Payer: Medicare Other

## 2016-05-03 ENCOUNTER — Encounter: Payer: Self-pay | Admitting: Hematology

## 2016-05-03 ENCOUNTER — Ambulatory Visit (HOSPITAL_BASED_OUTPATIENT_CLINIC_OR_DEPARTMENT_OTHER): Payer: Medicare Other | Admitting: Hematology

## 2016-05-03 VITALS — BP 130/97 | HR 82 | Resp 18 | Ht 69.0 in | Wt 207.3 lb

## 2016-05-03 DIAGNOSIS — R7989 Other specified abnormal findings of blood chemistry: Secondary | ICD-10-CM | POA: Diagnosis not present

## 2016-05-03 DIAGNOSIS — C7931 Secondary malignant neoplasm of brain: Secondary | ICD-10-CM | POA: Diagnosis not present

## 2016-05-03 DIAGNOSIS — C7800 Secondary malignant neoplasm of unspecified lung: Secondary | ICD-10-CM

## 2016-05-03 DIAGNOSIS — Z7189 Other specified counseling: Secondary | ICD-10-CM

## 2016-05-03 DIAGNOSIS — C770 Secondary and unspecified malignant neoplasm of lymph nodes of head, face and neck: Secondary | ICD-10-CM

## 2016-05-03 DIAGNOSIS — E039 Hypothyroidism, unspecified: Secondary | ICD-10-CM

## 2016-05-03 DIAGNOSIS — C799 Secondary malignant neoplasm of unspecified site: Secondary | ICD-10-CM

## 2016-05-03 DIAGNOSIS — I619 Nontraumatic intracerebral hemorrhage, unspecified: Secondary | ICD-10-CM

## 2016-05-03 DIAGNOSIS — C439 Malignant melanoma of skin, unspecified: Secondary | ICD-10-CM | POA: Diagnosis not present

## 2016-05-03 DIAGNOSIS — R5383 Other fatigue: Secondary | ICD-10-CM

## 2016-05-03 DIAGNOSIS — I10 Essential (primary) hypertension: Secondary | ICD-10-CM

## 2016-05-03 DIAGNOSIS — C7951 Secondary malignant neoplasm of bone: Secondary | ICD-10-CM | POA: Diagnosis not present

## 2016-05-03 DIAGNOSIS — Z5112 Encounter for antineoplastic immunotherapy: Secondary | ICD-10-CM

## 2016-05-03 LAB — COMPREHENSIVE METABOLIC PANEL
ALBUMIN: 3.4 g/dL — AB (ref 3.5–5.0)
ALK PHOS: 64 U/L (ref 40–150)
ALT: 16 U/L (ref 0–55)
ANION GAP: 9 meq/L (ref 3–11)
AST: 19 U/L (ref 5–34)
BILIRUBIN TOTAL: 0.53 mg/dL (ref 0.20–1.20)
BUN: 37.1 mg/dL — ABNORMAL HIGH (ref 7.0–26.0)
CO2: 20 mEq/L — ABNORMAL LOW (ref 22–29)
Calcium: 9.2 mg/dL (ref 8.4–10.4)
Chloride: 109 mEq/L (ref 98–109)
Creatinine: 1.3 mg/dL (ref 0.7–1.3)
EGFR: 53 mL/min/{1.73_m2} — AB (ref >90)
GLUCOSE: 97 mg/dL (ref 70–140)
POTASSIUM: 5.3 meq/L — AB (ref 3.5–5.1)
SODIUM: 139 meq/L (ref 136–145)
Total Protein: 6.4 g/dL (ref 6.4–8.3)

## 2016-05-03 LAB — CBC WITH DIFFERENTIAL/PLATELET
BASO%: 0.4 % (ref 0.0–2.0)
BASOS ABS: 0 10*3/uL (ref 0.0–0.1)
EOS%: 11.4 % — ABNORMAL HIGH (ref 0.0–7.0)
Eosinophils Absolute: 0.6 10*3/uL — ABNORMAL HIGH (ref 0.0–0.5)
HCT: 44.9 % (ref 38.4–49.9)
HEMOGLOBIN: 15.5 g/dL (ref 13.0–17.1)
LYMPH%: 10.8 % — AB (ref 14.0–49.0)
MCH: 30.6 pg (ref 27.2–33.4)
MCHC: 34.5 g/dL (ref 32.0–36.0)
MCV: 88.7 fL (ref 79.3–98.0)
MONO#: 0.5 10*3/uL (ref 0.1–0.9)
MONO%: 10.1 % (ref 0.0–14.0)
NEUT#: 3.4 10*3/uL (ref 1.5–6.5)
NEUT%: 67.3 % (ref 39.0–75.0)
PLATELETS: 136 10*3/uL — AB (ref 140–400)
RBC: 5.06 10*6/uL (ref 4.20–5.82)
RDW: 14.9 % — ABNORMAL HIGH (ref 11.0–14.6)
WBC: 5.1 10*3/uL (ref 4.0–10.3)
lymph#: 0.6 10*3/uL — ABNORMAL LOW (ref 0.9–3.3)

## 2016-05-03 LAB — TSH: TSH: 0.202 m[IU]/L — AB (ref 0.320–4.118)

## 2016-05-03 MED ORDER — DENOSUMAB 120 MG/1.7ML ~~LOC~~ SOLN
120.0000 mg | Freq: Once | SUBCUTANEOUS | Status: AC
  Administered 2016-05-03: 120 mg via SUBCUTANEOUS
  Filled 2016-05-03: qty 1.7

## 2016-05-03 MED ORDER — SODIUM CHLORIDE 0.9 % IV SOLN
Freq: Once | INTRAVENOUS | Status: AC
  Administered 2016-05-03: 12:00:00 via INTRAVENOUS

## 2016-05-03 MED ORDER — HEPARIN SOD (PORK) LOCK FLUSH 100 UNIT/ML IV SOLN
500.0000 [IU] | Freq: Once | INTRAVENOUS | Status: DC | PRN
  Filled 2016-05-03: qty 5

## 2016-05-03 MED ORDER — SODIUM CHLORIDE 0.9% FLUSH
10.0000 mL | INTRAVENOUS | Status: DC | PRN
  Filled 2016-05-03: qty 10

## 2016-05-03 MED ORDER — NIVOLUMAB CHEMO INJECTION 100 MG/10ML
240.0000 mg | Freq: Once | INTRAVENOUS | Status: AC
  Administered 2016-05-03: 240 mg via INTRAVENOUS
  Filled 2016-05-03: qty 4

## 2016-05-06 ENCOUNTER — Inpatient Hospital Stay: Payer: Medicare Other

## 2016-05-06 ENCOUNTER — Inpatient Hospital Stay
Admission: EM | Admit: 2016-05-06 | Discharge: 2016-05-08 | DRG: 378 | Disposition: A | Discharge status: Home / Self Care | Payer: Medicare Other | Attending: Internal Medicine | Admitting: Internal Medicine

## 2016-05-06 ENCOUNTER — Encounter: Payer: Self-pay | Admitting: Emergency Medicine

## 2016-05-06 DIAGNOSIS — I252 Old myocardial infarction: Secondary | ICD-10-CM | POA: Diagnosis not present

## 2016-05-06 DIAGNOSIS — K625 Hemorrhage of anus and rectum: Secondary | ICD-10-CM | POA: Diagnosis not present

## 2016-05-06 DIAGNOSIS — Z888 Allergy status to other drugs, medicaments and biological substances status: Secondary | ICD-10-CM

## 2016-05-06 DIAGNOSIS — Z8673 Personal history of transient ischemic attack (TIA), and cerebral infarction without residual deficits: Secondary | ICD-10-CM | POA: Diagnosis not present

## 2016-05-06 DIAGNOSIS — K219 Gastro-esophageal reflux disease without esophagitis: Secondary | ICD-10-CM | POA: Diagnosis present

## 2016-05-06 DIAGNOSIS — I10 Essential (primary) hypertension: Secondary | ICD-10-CM | POA: Diagnosis not present

## 2016-05-06 DIAGNOSIS — Z87891 Personal history of nicotine dependence: Secondary | ICD-10-CM | POA: Diagnosis not present

## 2016-05-06 DIAGNOSIS — E039 Hypothyroidism, unspecified: Secondary | ICD-10-CM | POA: Diagnosis present

## 2016-05-06 DIAGNOSIS — I959 Hypotension, unspecified: Secondary | ICD-10-CM | POA: Diagnosis present

## 2016-05-06 DIAGNOSIS — K5731 Diverticulosis of large intestine without perforation or abscess with bleeding: Secondary | ICD-10-CM | POA: Diagnosis present

## 2016-05-06 DIAGNOSIS — I2510 Atherosclerotic heart disease of native coronary artery without angina pectoris: Secondary | ICD-10-CM | POA: Diagnosis present

## 2016-05-06 DIAGNOSIS — Z809 Family history of malignant neoplasm, unspecified: Secondary | ICD-10-CM | POA: Diagnosis not present

## 2016-05-06 DIAGNOSIS — M19042 Primary osteoarthritis, left hand: Secondary | ICD-10-CM | POA: Diagnosis present

## 2016-05-06 DIAGNOSIS — Z951 Presence of aortocoronary bypass graft: Secondary | ICD-10-CM | POA: Diagnosis not present

## 2016-05-06 DIAGNOSIS — Z8249 Family history of ischemic heart disease and other diseases of the circulatory system: Secondary | ICD-10-CM | POA: Diagnosis not present

## 2016-05-06 DIAGNOSIS — K5730 Diverticulosis of large intestine without perforation or abscess without bleeding: Secondary | ICD-10-CM | POA: Diagnosis not present

## 2016-05-06 DIAGNOSIS — F329 Major depressive disorder, single episode, unspecified: Secondary | ICD-10-CM | POA: Diagnosis present

## 2016-05-06 DIAGNOSIS — Z85841 Personal history of malignant neoplasm of brain: Secondary | ICD-10-CM | POA: Diagnosis not present

## 2016-05-06 DIAGNOSIS — I129 Hypertensive chronic kidney disease with stage 1 through stage 4 chronic kidney disease, or unspecified chronic kidney disease: Secondary | ICD-10-CM | POA: Diagnosis present

## 2016-05-06 DIAGNOSIS — G4730 Sleep apnea, unspecified: Secondary | ICD-10-CM | POA: Diagnosis present

## 2016-05-06 DIAGNOSIS — N182 Chronic kidney disease, stage 2 (mild): Secondary | ICD-10-CM | POA: Diagnosis present

## 2016-05-06 DIAGNOSIS — Z85820 Personal history of malignant melanoma of skin: Secondary | ICD-10-CM

## 2016-05-06 DIAGNOSIS — Z8261 Family history of arthritis: Secondary | ICD-10-CM | POA: Diagnosis not present

## 2016-05-06 DIAGNOSIS — K640 First degree hemorrhoids: Secondary | ICD-10-CM | POA: Diagnosis not present

## 2016-05-06 DIAGNOSIS — Z96652 Presence of left artificial knee joint: Secondary | ICD-10-CM | POA: Diagnosis present

## 2016-05-06 DIAGNOSIS — K5790 Diverticulosis of intestine, part unspecified, without perforation or abscess without bleeding: Secondary | ICD-10-CM | POA: Diagnosis not present

## 2016-05-06 DIAGNOSIS — E875 Hyperkalemia: Secondary | ICD-10-CM | POA: Diagnosis present

## 2016-05-06 DIAGNOSIS — D62 Acute posthemorrhagic anemia: Secondary | ICD-10-CM | POA: Diagnosis present

## 2016-05-06 DIAGNOSIS — Z950 Presence of cardiac pacemaker: Secondary | ICD-10-CM

## 2016-05-06 DIAGNOSIS — K921 Melena: Secondary | ICD-10-CM | POA: Diagnosis not present

## 2016-05-06 DIAGNOSIS — K649 Unspecified hemorrhoids: Secondary | ICD-10-CM | POA: Diagnosis present

## 2016-05-06 DIAGNOSIS — M19041 Primary osteoarthritis, right hand: Secondary | ICD-10-CM | POA: Diagnosis present

## 2016-05-06 LAB — COMPREHENSIVE METABOLIC PANEL
ALT: 16 U/L — AB (ref 17–63)
AST: 23 U/L (ref 15–41)
Albumin: 3.8 g/dL (ref 3.5–5.0)
Alkaline Phosphatase: 49 U/L (ref 38–126)
Anion gap: 6 (ref 5–15)
BUN: 34 mg/dL — ABNORMAL HIGH (ref 6–20)
CHLORIDE: 108 mmol/L (ref 101–111)
CO2: 23 mmol/L (ref 22–32)
CREATININE: 1.25 mg/dL — AB (ref 0.61–1.24)
Calcium: 9 mg/dL (ref 8.9–10.3)
GFR calc Af Amer: >60 mL/min (ref >60)
GFR calc non Af Amer: 54 mL/min — ABNORMAL LOW (ref >60)
Glucose, Bld: 94 mg/dL (ref 65–99)
Potassium: 5.5 mmol/L — ABNORMAL HIGH (ref 3.5–5.1)
SODIUM: 137 mmol/L (ref 135–145)
Total Bilirubin: 0.9 mg/dL (ref 0.3–1.2)
Total Protein: 6.7 g/dL (ref 6.5–8.1)

## 2016-05-06 LAB — TYPE AND SCREEN
ABO/RH(D): O POS
ANTIBODY SCREEN: NEGATIVE

## 2016-05-06 LAB — GLUCOSE, CAPILLARY
GLUCOSE-CAPILLARY: 76 mg/dL (ref 65–99)
Glucose-Capillary: 92 mg/dL (ref 65–99)

## 2016-05-06 LAB — HEMOGLOBIN
HEMOGLOBIN: 11.6 g/dL — AB (ref 13.0–18.0)
Hemoglobin: 13.3 g/dL (ref 13.0–18.0)

## 2016-05-06 LAB — CBC WITH DIFFERENTIAL/PLATELET
BASOS ABS: 0 10*3/uL (ref 0–0.1)
BASOS PCT: 1 %
EOS ABS: 0.7 10*3/uL (ref 0–0.7)
EOS PCT: 12 %
HCT: 45.7 % (ref 40.0–52.0)
Hemoglobin: 15.6 g/dL (ref 13.0–18.0)
Lymphocytes Relative: 8 %
Lymphs Abs: 0.5 10*3/uL — ABNORMAL LOW (ref 1.0–3.6)
MCH: 30.6 pg (ref 26.0–34.0)
MCHC: 34.1 g/dL (ref 32.0–36.0)
MCV: 89.7 fL (ref 80.0–100.0)
Monocytes Absolute: 0.7 10*3/uL (ref 0.2–1.0)
Monocytes Relative: 12 %
Neutro Abs: 4.1 10*3/uL (ref 1.4–6.5)
Neutrophils Relative %: 67 %
PLATELETS: 159 10*3/uL (ref 150–440)
RBC: 5.09 MIL/uL (ref 4.40–5.90)
RDW: 15.9 % — ABNORMAL HIGH (ref 11.5–14.5)
WBC: 6.1 10*3/uL (ref 3.8–10.6)

## 2016-05-06 LAB — LACTIC ACID, PLASMA: Lactic Acid, Venous: 1 mmol/L (ref 0.5–1.9)

## 2016-05-06 LAB — PROTIME-INR
INR: 0.96
Prothrombin Time: 12.8 seconds (ref 11.4–15.2)

## 2016-05-06 LAB — POTASSIUM: Potassium: 5.6 mmol/L — ABNORMAL HIGH (ref 3.5–5.1)

## 2016-05-06 LAB — APTT: APTT: 34 s (ref 24–36)

## 2016-05-06 LAB — TROPONIN I

## 2016-05-06 MED ORDER — SODIUM POLYSTYRENE SULFONATE 15 GM/60ML PO SUSP
30.0000 g | Freq: Once | ORAL | Status: DC
  Filled 2016-05-06: qty 120

## 2016-05-06 MED ORDER — ONDANSETRON HCL 4 MG/2ML IJ SOLN: 4.0000 mg | Freq: Four times a day (QID) | INTRAMUSCULAR | Status: DC | PRN

## 2016-05-06 MED ORDER — SODIUM CHLORIDE 0.9 % IV BOLUS (SEPSIS)
500.0000 mL | Freq: Once | INTRAVENOUS | Status: AC
  Administered 2016-05-06: 14:00:00 500 mL via INTRAVENOUS

## 2016-05-06 MED ORDER — PANTOPRAZOLE SODIUM 40 MG IV SOLR: 40.0000 mg | Freq: Two times a day (BID) | INTRAVENOUS | Status: DC

## 2016-05-06 MED ORDER — ONDANSETRON HCL 4 MG PO TABS: 4.0000 mg | ORAL_TABLET | Freq: Four times a day (QID) | ORAL | Status: DC | PRN

## 2016-05-06 MED ORDER — SERTRALINE HCL 50 MG PO TABS
100.0000 mg | ORAL_TABLET | Freq: Every day | ORAL | Status: DC
  Administered 2016-05-07 – 2016-05-08 (×2): 100 mg via ORAL
  Filled 2016-05-06 (×2): qty 2

## 2016-05-06 MED ORDER — SODIUM CHLORIDE 0.9 % IV SOLN
INTRAVENOUS | Status: DC
  Administered 2016-05-06 – 2016-05-08 (×5): via INTRAVENOUS

## 2016-05-06 MED ORDER — PANTOPRAZOLE SODIUM 40 MG IV SOLR
40.0000 mg | Freq: Two times a day (BID) | INTRAVENOUS | Status: DC
  Administered 2016-05-06 – 2016-05-07 (×3): 40 mg via INTRAVENOUS
  Filled 2016-05-06 (×4): qty 40

## 2016-05-06 MED ORDER — FUROSEMIDE 10 MG/ML IJ SOLN: 20.0000 mg | Freq: Once | INTRAMUSCULAR | Status: DC

## 2016-05-06 MED ORDER — SODIUM CHLORIDE 0.9 % IV BOLUS (SEPSIS): 1000.0000 mL | Freq: Once | INTRAVENOUS | Status: DC

## 2016-05-06 MED ORDER — TECHNETIUM TC 99M-LABELED RED BLOOD CELLS IV KIT
20.0000 | PACK | Freq: Once | INTRAVENOUS | Status: AC | PRN
  Administered 2016-05-06: 22.978 via INTRAVENOUS

## 2016-05-06 MED ORDER — SODIUM CHLORIDE 0.9% FLUSH
3.0000 mL | Freq: Two times a day (BID) | INTRAVENOUS | Status: DC
  Administered 2016-05-06 – 2016-05-07 (×4): 3 mL via INTRAVENOUS

## 2016-05-06 MED ORDER — FUROSEMIDE 10 MG/ML IJ SOLN
20.0000 mg | Freq: Once | INTRAMUSCULAR | Status: AC
  Administered 2016-05-06: 22:00:00 20 mg via INTRAVENOUS
  Filled 2016-05-06: qty 2

## 2016-05-06 MED ORDER — SODIUM CHLORIDE 0.9 % IV SOLN
80.0000 mg | Freq: Once | INTRAVENOUS | Status: AC
  Administered 2016-05-06: 80 mg via INTRAVENOUS
  Filled 2016-05-06: qty 80

## 2016-05-06 MED ORDER — ROSUVASTATIN CALCIUM 10 MG PO TABS
10.0000 mg | ORAL_TABLET | Freq: Every day | ORAL | Status: DC
  Administered 2016-05-06 – 2016-05-07 (×2): 10 mg via ORAL
  Filled 2016-05-06 (×2): qty 1

## 2016-05-06 MED ORDER — ALBUTEROL SULFATE (2.5 MG/3ML) 0.083% IN NEBU
5.0000 mg | INHALATION_SOLUTION | Freq: Once | RESPIRATORY_TRACT | Status: AC
  Administered 2016-05-06: 5 mg via RESPIRATORY_TRACT
  Filled 2016-05-06: qty 6

## 2016-05-06 MED ORDER — LEVOTHYROXINE SODIUM 25 MCG PO TABS
125.0000 ug | ORAL_TABLET | Freq: Every day | ORAL | Status: DC
  Administered 2016-05-07: 09:00:00 125 ug via ORAL
  Filled 2016-05-06: qty 1

## 2016-05-06 MED ORDER — SODIUM CHLORIDE 0.9 % IV BOLUS (SEPSIS)
1000.0000 mL | Freq: Once | INTRAVENOUS | Status: AC
  Administered 2016-05-06: 1000 mL via INTRAVENOUS

## 2016-05-06 MED ORDER — SODIUM CHLORIDE 0.9 % IV SOLN
8.0000 mg/h | INTRAVENOUS | Status: DC
Start: 2016-05-06 — End: 2016-05-06
  Filled 2016-05-06: qty 80

## 2016-05-06 MED ORDER — CARVEDILOL 6.25 MG PO TABS
6.2500 mg | ORAL_TABLET | Freq: Two times a day (BID) | ORAL | Status: DC
Start: 2016-05-06 — End: 2016-05-07
  Filled 2016-05-06: qty 1

## 2016-05-06 NOTE — ED Notes (Signed)
Patient has had bloody stool x 3 while in ED, dark red blood with clots noted.

## 2016-05-06 NOTE — Consult Note (Signed)
Consultation  Referring Provider:     No ref. provider found Primary Care Physician:  [name], MD Primary Gastroenterologist:  none         Reason for Consultation:     hemtochezia  Date of Admission:  05/06/2016 Date of Consultation:  05/06/2016         HPI:   David [name] is a 76 y.o. male with a history notable for a diverticular bleeding c/b NSTEMI s/p 2vCABG and metastatic melanoma treated with nivolumab and ipilimumab who presents for evaluation of bright red blood per rectum.  About 4 years ago, the patient had a routine colonoscopy with one polypectomy. Three weeks after that he presented with profound hematochezia. His volume depletion was so significant that he was found to have an NSTEMI while hospitalized for the hematochezia. He subsequently underwent a 2 vessel bypass because there was concern about anticoagulating him. At that time, his bleed was attributed to his known diverticulosis. He was doing relatively well until May 2017 when he presented with a hemorrhagic stroke and was diagnosed with melanoma metastatic to the brain, left lung, lymph node and bone. He started therapy with nivolumab q2 weeks. His daughter believes that recently ipilimumab was added every other month. Radiologically, his melanoma has been responding well to therapy and he has not had any obvious complications of therapy.   Three days ago, the patient had a day of loser stools, but he continued to have "a couple" of BMs daily, which is routine for him. He was feeling well otherwise. This morning, he felt like he had to have a bowel movement, but noted that lots of blood came out with his stools. In the ER, his hemoglobin was noted to be stable, but he did become hypotensive. Since then he has had another 8-9 episodes of bloody stools, although he is not sure how much stool is actually in his bowel movements. He last had bloody stools around 14:15.   He denies abdominal pain, nausea, emesis,  constipation, coffee ground emesis, hematemesis, melena, f/s/c or rashes.  Past Medical History:  Diagnosis Date  . Anginal pain ([place])   . Arthritis    mostly hands  . Benign familial hematuria   . BPH (benign prostatic hypertrophy)   . Brain cancer ([place])    melanoma with brain met  . Coronary artery disease   . GERD (gastroesophageal reflux disease)   . High cholesterol   . Hypertension   . Hypothyroidism   . Intracerebral hemorrhage ([place])   . Melanoma ([place])   . Metastatic melanoma to lung ([place])   . Mood disorder ([place])   . Myocardial infarction   . Pacemaker    due to syncope, 3rd degree HB (upgrade to [facility]. Jude CRT-P 02/25/13 (Dr. Uvaldo Rising)  . Rectal bleed    due to NSAIDS  . Renal disorder   . Skin cancer    melanoma  . Sleep apnea    uses CPAP  . Stroke [facility])     Past Surgical History:  Procedure Laterality Date  . APPLICATION OF CRANIAL NAVIGATION N/A 10/15/2015   Procedure: APPLICATION OF CRANIAL NAVIGATION;  Surgeon: Erline Levine, MD;  Location: [place] NEURO ORS;  Service: Neurosurgery;  Laterality: N/A;  . CARDIAC CATHETERIZATION  2013  . CARDIAC SURGERY     bypass X 2  . CORONARY ARTERY BYPASS GRAFT  2013   LIMA-LAD, SVG-PDA 03/27/11 (Dr. Francee Gentile)  . CRANIOTOMY N/A 10/15/2015   Procedure: LEFT FRONTAL CRANIOTOMY TUMOR EXCISION with  Curve;  Surgeon: Erline Levine, MD;  Location: [place] NEURO ORS;  Service: Neurosurgery;  Laterality: N/A;  CRANIOTOMY TUMOR EXCISION  . EYE SURGERY    . FOOT SURGERY  08/2010  . JOINT REPLACEMENT Left    partial knee  . KNEE ARTHROSCOPY  12/2008  . LYMPH NODE BIOPSY    . PACEMAKER INSERTION    . TONSILLECTOMY      Prior to Admission medications   Medication Sig Start Date End Date Taking? Authorizing Provider  acetaminophen (TYLENOL) 325 MG tablet Take 2 tablets (650 mg total) by mouth every 6 (six) hours as needed for mild pain. Patient taking differently: Take 650 mg by mouth 2 (two) times daily.  09/09/15  Yes Ivan Anchors Love, PA-C    carvedilol (COREG) 6.25 MG tablet Take 6.25 mg by mouth 2 (two) times daily with a meal.   Yes Historical Provider, MD  levothyroxine (SYNTHROID, LEVOTHROID) 125 MCG tablet TAKE 1 TABLET BY MOUTH EVERY MORNING BEFORE BREAKFAST Patient taking differently: Take  175 mcg  daily 11/19/15  Yes Venia Carbon, MD  lisinopril (PRINIVIL,ZESTRIL) 5 MG tablet Take 1 tablet by mouth daily. 04/11/16  Yes Historical Provider, MD  Multiple Vitamin (MULTIVITAMIN WITH MINERALS) TABS tablet Take 1 tablet by mouth daily.   Yes Historical Provider, MD  rosuvastatin (CRESTOR) 10 MG tablet Take 10 mg by mouth at bedtime.   Yes Historical Provider, MD  sertraline (ZOLOFT) 100 MG tablet Take 1 tablet by mouth daily. 02/29/16  Yes Historical Provider, MD  Turmeric Curcumin 500 MG CAPS Take 1,000 mg by mouth 2 (two) times daily.    Yes Historical Provider, MD  PRESCRIPTION MEDICATION Inhale into the lungs at bedtime. CPAP    Historical Provider, MD  PREVNAR 13 SUSP injection ADM 0.5ML IM UTD 01/22/16   Historical Provider, MD  UNABLE TO FIND Speech Therapy -  Please evaluate pt for  Partial  Aphasia and  Treat 04/06/16   [name], MD    Family History  Problem Relation Age of Onset  . Cancer Mother 27    lung   . Hypertension Mother   . Hypertension Father   . Heart attack Father   . Heart disease Father   . Rheum arthritis Sister      Social History  Substance Use Topics  . Smoking status: Former Smoker    Packs/day: 1.00    Years: 3.00    Quit date: 04/11/1919  . Smokeless tobacco: Never Used  . Alcohol use 39.0 oz/week    35 Cans of beer, 30 Glasses of wine per week     Comment: daily for the past 10 years - last few weeks none    Allergies as of 05/06/2016 - Review Complete 05/06/2016  Allergen Reaction Noted  . Zocor [simvastatin] Other (See Comments) 09/17/2015    Review of Systems:    All systems reviewed and negative except where noted in HPI.   Physical Exam:  Vital signs in last 24  hours: Temp:  [97.4 F (36.3 C)-97.7 F (36.5 C)] 97.7 F (36.5 C) (01/27 1421) Pulse Rate:  [74-79] 78 (01/27 1509) Resp:  [18] 18 (01/27 1421) BP: (92-137)/(73-93) 118/85 (01/27 1509) SpO2:  [93 %-99 %] 99 % (01/27 1421) Weight:  [93.9 kg (207 lb)] 93.9 kg (207 lb) (01/27 1046)   General: Older white male laying comfortably in a hospital bed Head:  Normocephalic Eyes:   No icterus. Ears:  Normal auditory acuity. Mouth: dry mucous membranes, clear Neck:  Supple; no masses, but palpable submandibular lymph nodes Lungs: Respirations even and unlabored. Lungs clear to auscultation bilaterally. No wheezes, crackles, or rhonchi.  Heart: Regular rate and rhythm, normal S1, S2 without murmurs Abdomen: +bowel sounds, soft, nondistended, very mildly tender in the LLQ Msk:  Symmetrical without gross deformities. Extremities: Warm Neurologic:  Alert, grossly unremarkable, communicating appropriately Skin: Intact without significant lesions or rashes. Psych:  Alert with an appropriate affect  LAB RESULTS:  Recent Labs  05/06/16 1106 05/06/16 1420  WBC 6.1  --   HGB 15.6 13.3  HCT 45.7  --   PLT 159  --    BMET  Recent Labs  05/06/16 1106 05/06/16 1420  NA 137  --   K 5.5* 5.6*  CL 108  --   CO2 23  --   GLUCOSE 94  --   BUN 34*  --   CREATININE 1.25*  --   CALCIUM 9.0  --    LFT  Recent Labs  05/06/16 1106  PROT 6.7  ALBUMIN 3.8  AST 23  ALT 16*  ALKPHOS 49  BILITOT 0.9   PT/INR  Recent Labs  05/06/16 1106  LABPROT 12.8  INR 0.96    STUDIES: No results found.    Impression / Plan:   Irl Bodie is a 76 y.o. y/o male with a history notable for a diverticular bleeding c/b NSTEMI s/p 2vCABG and metastatic melanoma treated with nivolumab and ipilimumab who presents for evaluation of bright red blood per rectum. Clinically, the patient's history is most consistent with a diverticular bleed. Checkpoint inhibitor induced colitis, more likely due to  ipilimumab rather than nivolumab, can certainly be possible although this is not a classic presentation for that. Lowest on the differential is metastatic melanoma, which is unlikely as his melanoma is radiologically responding to treatment. His blood counts have likely not equilibrated yet, but his hemodynamics signal that he certainly had a significant bleed. Therefore, it is most important to monitor him while hospitalized to stabilize him during ongoing bleeding.  - 2 large bore IVs - bolus IVF until adequately volume resuccitated - NPO while concern for active bleeding - have nursing staff observe every BM - monitor Hg q8h - should the patient not have a BM for 6-8hrs, start golytely prep to clean out old blood from the colon - at this time, a bleeding scan would not have much utility as he is likely not bleeding briskly enough to localize, but a CT Abd & Pelvis with IV and PO contrast would help evaluate for colitis - we can decide on the utility of an inpatient versus outpatient colonoscopy pending the patient's clinical situation and results of cross sectional imaging  Thank you for involving me in the care of this patient.      LOS: 0 days   Lisbeth Renshaw, MD  05/06/2016, 3:41 PM

## 2016-05-06 NOTE — ED Triage Notes (Signed)
Pt reports rectal bleeding x 1 hour. Pouring out blood. Pt had similar episode 4 years ago and had an MI. Denies any chest pain. Pt is not on any blood thinners at this time. Colonoscopy last done 5 years ago. Pt may have had a BM this morning. There have been some clots as well.

## 2016-05-06 NOTE — ED Notes (Addendum)
Dr. Quentin Cornwall notified that patients blood pressure is now 92/78, verbal order given for 1 liter normal saline bolus in pressure bag and recheck blood pressure after bolus

## 2016-05-06 NOTE — H&P (Signed)
[place] [facility] [place] NAME: David [name]    MR#:  [id number]  DATE OF BIRTH:  [birth year]  DATE OF ADMISSION:  05/06/2016  PRIMARY CARE PHYSICIAN: [name], MD   REQUESTING/REFERRING PHYSICIAN:   CHIEF COMPLAINT:   Chief Complaint  Patient presents with  . 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 According to the patient, he was well up until 9 AM, when he felt that he needed to go to the bathroom to have bowel movement. He did not have much of a bowel movement, however, had bright red blood in the commode, he thinks that it was about half of cup of blood. He had at least 6 or 7 episodes of bleeding. Today, he denies any lightheadedness, dizziness or shortness of breath, chest pains or feeling presyncopal. He has no abdominal pains. No nausea, vomiting. He had a few episodes of diarrheal stool yesterday, resolved now. Hospitalist services were contacted for admission  PAST MEDICAL HISTORY:   Past Medical History:  Diagnosis Date  . Anginal pain ([place])   . Arthritis    mostly hands  . Benign familial hematuria   . BPH (benign prostatic hypertrophy)   . Brain cancer ([place])    melanoma with brain met  . Coronary artery disease   . GERD (gastroesophageal reflux disease)   . High cholesterol   . Hypertension   . Hypothyroidism   . Intracerebral hemorrhage ([place])   . Melanoma ([place])   . Metastatic melanoma to lung ([place])   . Mood disorder ([place])   . Myocardial infarction   . Pacemaker    due to syncope, 3rd degree HB (upgrade to [facility]. Jude CRT-P 02/25/13 (Dr. Uvaldo Rising)  . Rectal bleed    due to NSAIDS  . Renal disorder   . Skin cancer    melanoma  . Sleep apnea    uses CPAP  .  Stroke [facility])     PAST SURGICAL HISTORY: Past Surgical History:  Procedure Laterality Date  . APPLICATION OF CRANIAL NAVIGATION N/A 10/15/2015   Procedure: APPLICATION OF CRANIAL NAVIGATION;  Surgeon: Erline Levine, MD;  Location: [place] NEURO ORS;  Service: Neurosurgery;  Laterality: N/A;  . CARDIAC CATHETERIZATION  2013  . CARDIAC SURGERY     bypass X 2  . CORONARY ARTERY BYPASS GRAFT  2013   LIMA-LAD, SVG-PDA 03/27/11 (Dr. Francee Gentile)  . CRANIOTOMY N/A 10/15/2015   Procedure: LEFT FRONTAL CRANIOTOMY TUMOR EXCISION with Curve;  Surgeon: Erline Levine, MD;  Location: [place] NEURO ORS;  Service: Neurosurgery;  Laterality: N/A;  CRANIOTOMY TUMOR EXCISION  . EYE SURGERY    . FOOT SURGERY  08/2010  . JOINT REPLACEMENT Left    partial knee  . KNEE ARTHROSCOPY  12/2008  . LYMPH NODE BIOPSY    . PACEMAKER INSERTION    . TONSILLECTOMY      SOCIAL HISTORY:  Social History  Substance Use Topics  . Smoking status: Former Smoker    Packs/day: 1.00    Years: 3.00    Quit date: 04/11/1919  . Smokeless tobacco: Never Used  . Alcohol use 2.4 oz/week    4 Glasses of wine per week     Comment: daily for the past 10 years - last few weeks none  FAMILY HISTORY:  Family History  Problem Relation Age of Onset  . Cancer Mother 83    lung   . Hypertension Mother   . Hypertension Father   . Heart attack Father   . Heart disease Father   . Rheum arthritis Sister     DRUG ALLERGIES:  Allergies  Allergen Reactions  . Zocor [Simvastatin] Other (See Comments)    Fever - temp of 103.   In 2003    Review of Systems  Constitutional: Negative for chills, fever and weight loss.  HENT: Negative for congestion.   Eyes: Negative for blurred vision and double vision.  Respiratory: Negative for cough, sputum production, shortness of breath and wheezing.   Cardiovascular: Negative for chest pain, palpitations, orthopnea, leg swelling and PND.  Gastrointestinal: Positive for blood in stool. Negative for abdominal  pain, constipation, diarrhea, nausea and vomiting.  Genitourinary: Negative for dysuria, frequency, hematuria and urgency.  Musculoskeletal: Negative for falls.  Neurological: Negative for dizziness, tremors, focal weakness and headaches.  Endo/Heme/Allergies: Does not bruise/bleed easily.  Psychiatric/Behavioral: Negative for depression. The patient does not have insomnia.     MEDICATIONS AT HOME:  Prior to Admission medications   Medication Sig Start Date End Date Taking? Authorizing Provider  acetaminophen (TYLENOL) 325 MG tablet Take 2 tablets (650 mg total) by mouth every 6 (six) hours as needed for mild pain. Patient taking differently: Take 650 mg by mouth 3 (three) times daily.  09/09/15  Yes Ivan Anchors Love, PA-C  carvedilol (COREG) 6.25 MG tablet Take 6.25 mg by mouth 2 (two) times daily with a meal.   Yes Historical Provider, MD  levothyroxine (SYNTHROID, LEVOTHROID) 125 MCG tablet TAKE 1 TABLET BY MOUTH EVERY MORNING BEFORE BREAKFAST Patient taking differently: Take  175 mcg  daily 11/19/15  Yes Venia Carbon, MD  lisinopril (PRINIVIL,ZESTRIL) 5 MG tablet Take 1 tablet by mouth daily. 04/11/16  Yes Historical Provider, MD  Multiple Vitamin (MULTIVITAMIN WITH MINERALS) TABS tablet Take 1 tablet by mouth daily.   Yes Historical Provider, MD  rosuvastatin (CRESTOR) 10 MG tablet Take 10 mg by mouth at bedtime.   Yes Historical Provider, MD  sertraline (ZOLOFT) 100 MG tablet Take 1 tablet by mouth daily. 02/29/16  Yes Historical Provider, MD  Turmeric Curcumin 500 MG CAPS Take 1,000 mg by mouth 2 (two) times daily.    Yes Historical Provider, MD  PRESCRIPTION MEDICATION Inhale into the lungs at bedtime. CPAP    Historical Provider, MD  PREVNAR 13 SUSP injection ADM 0.5ML IM UTD 01/22/16   Historical Provider, MD  UNABLE TO FIND Speech Therapy -  Please evaluate pt for  Partial  Aphasia and  Treat 04/06/16   [name], MD      PHYSICAL EXAMINATION:   VITAL SIGNS: Blood pressure (!)  137/93, pulse 75, temperature 97.4 F (36.3 C), temperature source Oral, resp. rate 18, height 5' 9.5" (1.765 m), weight 93.9 kg (207 lb), SpO2 96 %.  GENERAL:  76 y.o.-year-old patient lying in the bed with no acute distress. Pale, but Comfortable EYES: Pupils equal, round, reactive to light and accommodation. No scleral icterus. Extraocular muscles intact.  HEENT: Head atraumatic, normocephalic. Oropharynx and nasopharynx clear.  NECK:  Supple, no jugular venous distention. No thyroid enlargement, no tenderness.  LUNGS: Normal breath sounds bilaterally, no wheezing, rales,rhonchi or crepitation. No use of accessory muscles of respiration.  CARDIOVASCULAR: S1, S2 normal. No murmurs, rubs, or gallops.  ABDOMEN: Soft, nontender, nondistended. Bowel sounds present. No organomegaly  or mass.  EXTREMITIES: Trace lower extremity and pedal edema, no cyanosis, or clubbing.  NEUROLOGIC: Cranial nerves II through XII are intact. Muscle strength 5/5 in all extremities. Sensation intact. Gait not checked.  PSYCHIATRIC: The patient is alert and oriented x 3.  SKIN: No obvious rash, lesion, or ulcer.   LABORATORY PANEL:   CBC  Recent Labs Lab 05/03/16 1127 05/06/16 1106  WBC 5.1 6.1  HGB 15.5 15.6  HCT 44.9 45.7  PLT 136* 159  MCV 88.7 89.7  MCH 30.6 30.6  MCHC 34.5 34.1  RDW 14.9* 15.9*  LYMPHSABS 0.6* 0.5*  MONOABS 0.5 0.7  EOSABS 0.6* 0.7  BASOSABS 0.0 0.0   ------------------------------------------------------------------------------------------------------------------  Chemistries   Recent Labs Lab 05/06/16 1106  NA 137  K 5.5*  CL 108  CO2 23  GLUCOSE 94  BUN 34*  CREATININE 1.25*  CALCIUM 9.0  AST 23  ALT 16*  ALKPHOS 49  BILITOT 0.9   ------------------------------------------------------------------------------------------------------------------  Cardiac Enzymes  Recent Labs Lab 05/06/16 1106  TROPONINI <0.03    ------------------------------------------------------------------------------------------------------------------  RADIOLOGY: No results found.  EKG: Orders placed or performed during the hospital encounter of 08/22/15  . EKG 12-Lead  . EKG 12-Lead  . EKG  EKG revealed initial ventricular dual paced rhythm, no further analysis attempted due to paced at, st-t changes  IMPRESSION AND PLAN:  Active Problems:   Hematochezia  #1. Hematochezia, likely related to diverticulosis, although cannot rule out metastatic melanoma bleeding. Initiate patient on Protonix intravenously, keep him nothing by mouth, continue IV fluids, get gastroenterologist involved for further recommendations, get bleeding scan. #2. Hyperkalemia, administer IV fluids, 1 dose of Lasix, follow potassium later today #3. Chronic renal insufficiency, likely stage II, follow closely #4 . Acute posthemorrhagic anemia, hemoglobin level remained stable, follow hemoglobin every 8 hours and transfuse patient as needed   All the records are reviewed and case discussed with ED provider. Management plans discussed with the patient, family and they are in agreement.  CODE STATUS: Code Status History    Date Active Date Inactive Code Status Order ID Comments User Context   10/15/2015  1:19 PM 10/21/2015  3:34 PM Full Code [id number]  Erline Levine, MD Inpatient   08/26/2015  4:51 PM 09/10/2015  6:19 PM Full Code [id number]  Bary Leriche, PA-C Inpatient   08/23/2015 12:35 AM 08/26/2015  4:51 PM Full Code [id number]  Wallie Char Inpatient       TOTAL TIME TAKING CARE OF THIS PATIENT: 50 minutes.    Theodoro Grist M.D on 05/06/2016 at 12:35 PM  Between 7am to 6pm - Pager - [phone] After 6pm go to [email or web address] - password [email or web address] [facility]  Office  [phone]  CC: Primary care physician; [name], MD

## 2016-05-06 NOTE — ED Notes (Signed)
Called pharmacy and requested that protonix drip and bolus be sent to ED.

## 2016-05-06 NOTE — ED Provider Notes (Signed)
[facility] Emergency Department Provider Note    First MD Initiated Contact with Patient 05/06/16 1056     (approximate)  I have reviewed the triage vital signs and the nursing notes.   HISTORY  Chief Complaint Rectal Bleeding    HPI David [name] is a 76 y.o. male with a history of metastatic melanoma undergo immediate therapy presents with acute met hematochezia that started this morning. Patient states he's had a couple episodes of large-volume hematochezia. Patient has a known history of polyps as well as diverticulosis, hemorrhoids as well as gastritis secondary to NSAIDs. Patient denies any epigastric pain right now and denies any recent NSAID use. Denies any recent melena. States that yesterday he was otherwise feeling well. Denies any recent fevers. Denies any weakness or shortness of breath or chest pain at this time.   Past Medical History:  Diagnosis Date  . Anginal pain ([place])   . Arthritis    mostly hands  . Benign familial hematuria   . BPH (benign prostatic hypertrophy)   . Brain cancer ([place])    melanoma with brain met  . Coronary artery disease   . GERD (gastroesophageal reflux disease)   . High cholesterol   . Hypertension   . Hypothyroidism   . Intracerebral hemorrhage ([place])   . Melanoma ([place])   . Metastatic melanoma to lung ([place])   . Mood disorder ([place])   . Myocardial infarction   . Pacemaker    due to syncope, 3rd degree HB (upgrade to [facility]. Jude CRT-P 02/25/13 (Dr. Uvaldo Rising)  . Rectal bleed    due to NSAIDS  . Renal disorder   . Skin cancer    melanoma  . Sleep apnea    uses CPAP  . Stroke [facility])    Family History  Problem Relation Age of Onset  . Cancer Mother 25    lung   . Hypertension Mother   . Hypertension Father   . Heart attack Father   . Heart disease Father   . Rheum arthritis Sister    Past Surgical History:  Procedure Laterality Date  . APPLICATION OF CRANIAL NAVIGATION N/A 10/15/2015   Procedure:  APPLICATION OF CRANIAL NAVIGATION;  Surgeon: Erline Levine, MD;  Location: [place] [facility]UR[facility] ORS;  Service: Neurosurgery;  Laterality: N/A;  . CARDIAC CATHETERIZATION  2013  . CARDIAC SURGERY     bypass X 2  . CORONARY ARTERY BYPASS GRAFT  2013   LIMA-LAD, SVG-PDA 03/27/11 (Dr. Francee Gentile)  . CRANIOTOMY N/A 10/15/2015   Procedure: LEFT FRONTAL CRANIOTOMY TUMOR EXCISION with Curve;  Surgeon: Erline Levine, MD;  Location: [place] NEURO ORS;  Service: Neurosurgery;  Laterality: N/A;  CRANIOTOMY TUMOR EXCISION  . EYE SURGERY    . FOOT SURGERY  08/2010  . JOINT REPLACEMENT Left    partial knee  . KNEE ARTHROSCOPY  12/2008  . LYMPH NODE BIOPSY    . PACEMAKER INSERTION    . TONSILLECTOMY     Patient Active Problem List   Diagnosis Date Noted  . Hematochezia 05/06/2016  . Goals of care, counseling/discussion 05/03/2016  . Hypothyroidism (acquired) 11/24/2015  . Hypotestosteronemia 11/24/2015  . Metastatic melanoma to lung ([place])   . Mood disorder ([place])   . Intracerebral hemorrhage ([place])   . BPH (benign prostatic hypertrophy)   . Metastasis to brain ([place]) 10/15/2015  . Metastatic melanoma ([place]) 09/22/2015  . Mass of lower lobe of left lung   . Chronic kidney disease, stage III (moderate)   . Hyperlipidemia  08/26/2015  . Obesity 08/26/2015  . Incontinence 08/26/2015  . Hemorrhagic stroke ([place]) 08/26/2015  . Hemiplegia and hemiparesis following nontraumatic intracerebral hemorrhage affecting right dominant side ([place]) 08/26/2015  . Aphasia following nontraumatic intracerebral hemorrhage 08/26/2015  . Gait disturbance, post-stroke 08/26/2015  . Benign essential HTN   . OSA (obstructive sleep apnea)   . CKD (chronic kidney disease)   . Pacemaker   . ICH (intracerebral hemorrhage) (H[facility]) - L frontal due to HTN vs CAA 08/22/2015      Prior to Admission medications   Medication Sig Start Date End Date Taking? Authorizing Provider  acetaminophen (TYLENOL) 325 MG tablet Take 2 tablets (650 mg total) by mouth  every 6 (six) hours as needed for mild pain. Patient taking differently: Take 650 mg by mouth 2 (two) times daily.  09/09/15  Yes Ivan Anchors Love, PA-C  carvedilol (COREG) 6.25 MG tablet Take 6.25 mg by mouth 2 (two) times daily with a meal.   Yes Historical Provider, MD  levothyroxine (SYNTHROID, LEVOTHROID) 125 MCG tablet TAKE 1 TABLET BY MOUTH EVERY MORNING BEFORE BREAKFAST Patient taking differently: Take  175 mcg  daily 11/19/15  Yes Venia Carbon, MD  lisinopril (PRINIVIL,ZESTRIL) 5 MG tablet Take 1 tablet by mouth daily. 04/11/16  Yes Historical Provider, MD  Multiple Vitamin (MULTIVITAMIN WITH MINERALS) TABS tablet Take 1 tablet by mouth daily.   Yes Historical Provider, MD  rosuvastatin (CRESTOR) 10 MG tablet Take 10 mg by mouth at bedtime.   Yes Historical Provider, MD  sertraline (ZOLOFT) 100 MG tablet Take 1 tablet by mouth daily. 02/29/16  Yes Historical Provider, MD  Turmeric Curcumin 500 MG CAPS Take 1,000 mg by mouth 2 (two) times daily.    Yes Historical Provider, MD  PRESCRIPTION MEDICATION Inhale into the lungs at bedtime. CPAP    Historical Provider, MD  PREVNAR 13 SUSP injection ADM 0.5ML IM UTD 01/22/16   Historical Provider, MD  UNABLE TO FIND Speech Therapy -  Please evaluate pt for  Partial  Aphasia and  Treat 04/06/16   [name], MD    Allergies Zocor [simvastatin]    Social History Social History  Substance Use Topics  . Smoking status: Former Smoker    Packs/day: 1.00    Years: 3.00    Quit date: 04/11/1919  . Smokeless tobacco: Never Used  . Alcohol use 39.0 oz/week    35 Cans of beer, 30 Glasses of wine per week     Comment: daily for the past 10 years - last few weeks none    Review of Systems Patient denies headaches, rhinorrhea, blurry vision, numbness, shortness of breath, chest pain, edema, cough, abdominal pain, nausea, vomiting, diarrhea, dysuria, fevers, rashes or hallucinations unless otherwise stated above in  HPI. ____________________________________________   PHYSICAL EXAM:  VITAL SIGNS: Vitals:   05/06/16 1309 05/06/16 1421  BP: 92/78 96/73  Pulse:  75  Resp:  18  Temp:  97.7 F (36.5 C)    Constitutional: Alert and oriented.  in no acute distress. Eyes: Conjunctivae are normal. PERRL. EOMI. Head: Atraumatic. Nose: No congestion/rhinnorhea. Mouth/Throat: Mucous membranes are moist.  Oropharynx non-erythematous. Neck: No stridor. Painless ROM. No cervical spine tenderness to palpation Hematological/Lymphatic/Immunilogical: No cervical lymphadenopathy. Cardiovascular: Normal rate, regular rhythm. Grossly normal heart sounds.  Good peripheral circulation. Respiratory: Normal respiratory effort.  No retractions. Lungs CTAB. Gastrointestinal: Soft and nontender. No distention. No abdominal bruits. No CVA tenderness. Genitourinary: Patient does have evidence of external nonthrombosed hemorrhoids but no evidence of  active hemorrhage. There is frank hematochezia in the toilet. Musculoskeletal: No lower extremity tenderness nor edema.  No joint effusions. Neurologic:  Normal speech and language. No gross focal neurologic deficits are appreciated. No gait instability. Skin:  Skin is warm, dry and intact. No rash noted. Psychiatric: Mood and affect are normal. Speech and behavior are normal. ____________________________________________   LABS (all labs ordered are listed, but only abnormal results are displayed)  Results for orders placed or performed during the hospital encounter of 05/06/16 (from the past 24 hour(s))  Type and screen [place]     Status: None   Collection Time: 05/06/16 11:06 AM  Result Value Ref Range   ABO/RH(D) O POS    Antibody Screen NEG    Sample Expiration 05/09/2016   CBC with Differential/Platelet     Status: Abnormal   Collection Time: 05/06/16 11:06 AM  Result Value Ref Range   WBC 6.1 3.8 - 10.6 K/uL   RBC 5.09 4.40 - 5.90 MIL/uL    Hemoglobin 15.6 13.0 - 18.0 g/dL   HCT 45.7 40.0 - 52.0 %   MCV 89.7 80.0 - 100.0 fL   MCH 30.6 26.0 - 34.0 pg   MCHC 34.1 32.0 - 36.0 g/dL   RDW 15.9 (H) 11.5 - 14.5 %   Platelets 159 150 - 440 K/uL   Neutrophils Relative % 67 %   Neutro Abs 4.1 1.4 - 6.5 K/uL   Lymphocytes Relative 8 %   Lymphs Abs 0.5 (L) 1.0 - 3.6 K/uL   Monocytes Relative 12 %   Monocytes Absolute 0.7 0.2 - 1.0 K/uL   Eosinophils Relative 12 %   Eosinophils Absolute 0.7 0 - 0.7 K/uL   Basophils Relative 1 %   Basophils Absolute 0.0 0 - 0.1 K/uL  Comprehensive metabolic panel     Status: Abnormal   Collection Time: 05/06/16 11:06 AM  Result Value Ref Range   Sodium 137 135 - 145 mmol/L   Potassium 5.5 (H) 3.5 - 5.1 mmol/L   Chloride 108 101 - 111 mmol/L   CO2 23 22 - 32 mmol/L   Glucose, Bld 94 65 - 99 mg/dL   BUN 34 (H) 6 - 20 mg/dL   Creatinine, Ser 1.25 (H) 0.61 - 1.24 mg/dL   Calcium 9.0 8.9 - 10.3 mg/dL   Total Protein 6.7 6.5 - 8.1 g/dL   Albumin 3.8 3.5 - 5.0 g/dL   AST 23 15 - 41 U/L   ALT 16 (L) 17 - 63 U/L   Alkaline Phosphatase 49 38 - 126 U/L   Total Bilirubin 0.9 0.3 - 1.2 mg/dL   GFR calc non Af Amer 54 (L) >60 mL/min   GFR calc Af Amer >60 >60 mL/min   Anion gap 6 5 - 15  Lactic acid, plasma     Status: None   Collection Time: 05/06/16 11:06 AM  Result Value Ref Range   Lactic Acid, Venous 1.0 0.5 - 1.9 mmol/L  Troponin I     Status: None   Collection Time: 05/06/16 11:06 AM  Result Value Ref Range   Troponin I <0.03 <0.03 ng/mL  Protime-INR     Status: None   Collection Time: 05/06/16 11:06 AM  Result Value Ref Range   Prothrombin Time 12.8 11.4 - 15.2 seconds   INR 0.96   APTT     Status: None   Collection Time: 05/06/16 11:06 AM  Result Value Ref Range   aPTT 34 24 - 36  seconds   ____________________________________________  EKG My review and personal interpretation at Time: 11:05 Indication: tachycardia  Rate: 90  Rhythm: A/V paced Axis: normal Other: no Sgar[place]ossa  criteria ____________________________________________  RADIOLOGY  I personally reviewed all radiographic images ordered to evaluate for the above acute complaints and reviewed radiology reports and findings.  These findings were personally discussed with the patient.  Please see medical record for radiology report.  ____________________________________________   PROCEDURES  Procedure(s) performed:  Procedures    Critical Care performed: yes CRITICAL CARE Performed by: Merlyn Lot   Total critical care time: 35 minutes  Critical care time was exclusive of separately billable procedures and treating other patients.  Critical care was necessary to treat or prevent imminent or life-threatening deterioration.  Critical care was time spent personally by me on the following activities: development of treatment plan with patient and/or surrogate as well as nursing, discussions with consultants, evaluation of patient's response to treatment, examination of patient, obtaining history from patient or surrogate, ordering and performing treatments and interventions, ordering and review of laboratory studies, ordering and review of radiographic studies, pulse oximetry and re-evaluation of patient's condition.  ____________________________________________   INITIAL IMPRESSION / ASSESSMENT AND PLAN / ED COURSE  Pertinent labs & imaging results that were available during my care of the patient were reviewed by me and considered in my medical decision making (see chart for details).  DDX: upgib, lgib, hemorrhoid, diverticulosis, abla  Rosevelt Luu is a 76 y.o. who presents to the ED with frank hematochezia. Patient hemodynamically stable and well perfused. Patient with complex past medical history including multiple sources of GI bleeding as well as heart disease. We'll check blood work. No evidence of bleeding from hemorrhoids.  The patient will be placed on continuous pulse oximetry  and telemetry for monitoring.  Laboratory evaluation will be sent to evaluate for the above complaints.     Clinical Course as of May 06 1434  Sat May 06, 2016  1145 H/H fortunately with Hgb of 15.  INR normal.  [PR]  1155 Renal function also baseline with mild hyperkalemia which I will treat. We'll touch base with GI is a do feel the patient will require admission for further hemodynamic monitoring given his complex past medical history. Spoke with Dr. Clayton Bibles who kindly agrees to admit patient for further evaluation and management. [PR]    Clinical Course User Index [PR] Merlyn Lot, MD     ____________________________________________   FINAL CLINICAL IMPRESSION(S) / ED DIAGNOSES  Final diagnoses:  Hematochezia  Hyperkalemia      NEW MEDICATIONS STARTED DURING THIS VISIT:  Current Discharge Medication List       Note:  This document was prepared using Dragon voice recognition software and may include unintentional dictation errors.    Merlyn Lot, MD 05/06/16 [phone]

## 2016-05-06 NOTE — ED Triage Notes (Signed)
Pt currently undergoing immunotherapy for melanomas.

## 2016-05-07 LAB — BASIC METABOLIC PANEL
Anion gap: 6 (ref 5–15)
BUN: 33 mg/dL — AB (ref 6–20)
CHLORIDE: 112 mmol/L — AB (ref 101–111)
CO2: 20 mmol/L — AB (ref 22–32)
Calcium: 7.6 mg/dL — ABNORMAL LOW (ref 8.9–10.3)
Creatinine, Ser: 1.45 mg/dL — ABNORMAL HIGH (ref 0.61–1.24)
GFR calc Af Amer: 52 mL/min — ABNORMAL LOW (ref >60)
GFR calc non Af Amer: 45 mL/min — ABNORMAL LOW (ref >60)
Glucose, Bld: 93 mg/dL (ref 65–99)
POTASSIUM: 4.6 mmol/L (ref 3.5–5.1)
SODIUM: 138 mmol/L (ref 135–145)

## 2016-05-07 LAB — CBC
HEMATOCRIT: 32.3 % — AB (ref 40.0–52.0)
Hemoglobin: 11 g/dL — ABNORMAL LOW (ref 13.0–18.0)
MCH: 31.1 pg (ref 26.0–34.0)
MCHC: 34 g/dL (ref 32.0–36.0)
MCV: 91.4 fL (ref 80.0–100.0)
Platelets: 127 10*3/uL — ABNORMAL LOW (ref 150–440)
RBC: 3.54 MIL/uL — ABNORMAL LOW (ref 4.40–5.90)
RDW: 15.9 % — AB (ref 11.5–14.5)
WBC: 5.2 10*3/uL (ref 3.8–10.6)

## 2016-05-07 LAB — HEMOGLOBIN: Hemoglobin: 11.3 g/dL — ABNORMAL LOW (ref 13.0–18.0)

## 2016-05-07 LAB — GLUCOSE, CAPILLARY
Glucose-Capillary: 90 mg/dL (ref 65–99)
Glucose-Capillary: 100 mg/dL — ABNORMAL HIGH (ref 65–99)

## 2016-05-07 MED ORDER — LEVOTHYROXINE SODIUM 25 MCG PO TABS: 175.0000 ug | ORAL_TABLET | Freq: Every day | ORAL | Status: DC

## 2016-05-07 MED ORDER — LEVOTHYROXINE SODIUM 125 MCG PO TABS: 125.0000 ug | ORAL_TABLET | Freq: Every day | ORAL | 9 refills | Status: DC

## 2016-05-07 MED ORDER — LEVOTHYROXINE SODIUM 25 MCG PO TABS
125.0000 ug | ORAL_TABLET | Freq: Every day | ORAL | Status: DC
  Administered 2016-05-08: 125 ug via ORAL
  Filled 2016-05-07: qty 1

## 2016-05-07 MED ORDER — PEG 3350-KCL-NA BICARB-NACL 420 G PO SOLR
4000.0000 mL | Freq: Once | ORAL | Status: AC
  Administered 2016-05-07: 14:00:00 4000 mL via ORAL
  Filled 2016-05-07: qty 4000

## 2016-05-07 MED ORDER — CARVEDILOL 3.125 MG PO TABS
3.1250 mg | ORAL_TABLET | Freq: Two times a day (BID) | ORAL | Status: DC
  Administered 2016-05-07 – 2016-05-08 (×2): 3.125 mg via ORAL
  Filled 2016-05-07 (×2): qty 1

## 2016-05-07 NOTE — Plan of Care (Signed)
Problem: Physical Regulation: Goal: Ability to maintain clinical measurements within normal limits will improve Outcome: Progressing Pt's hemoglobin and BP remain WNL.  Problem: Nutrition: Goal: Adequate nutrition will be maintained Outcome: Progressing Pt tolerating clear liquid diet well.  Problem: Bowel/Gastric: Goal: Will not experience complications related to bowel motility Outcome: Not Progressing Pt had large, bright red stool.

## 2016-05-07 NOTE — Progress Notes (Signed)
[facility] [place] NAME: David [name]    MR#:  [id number]  DATE OF BIRTH:  1940/08/13  SUBJECTIVE:   Had 1 BM yesterday With blood. No abdominal pain  REVIEW OF SYSTEMS:    Review of Systems  Constitutional: Negative.  Negative for chills, fever and malaise/fatigue.  HENT: Negative.  Negative for ear discharge, ear pain, hearing loss, nosebleeds and sore throat.   Eyes: Negative.  Negative for blurred vision and pain.  Respiratory: Negative.  Negative for cough, hemoptysis, shortness of breath and wheezing.   Cardiovascular: Negative.  Negative for chest pain, palpitations and leg swelling.  Gastrointestinal: Positive for blood in stool. Negative for abdominal pain, diarrhea, nausea and vomiting.  Genitourinary: Negative.  Negative for dysuria.  Musculoskeletal: Negative.  Negative for back pain.  Skin: Negative.   Neurological: Negative for dizziness, tremors, speech change, focal weakness, seizures and headaches.  Endo/Heme/Allergies: Negative.  Does not bruise/bleed easily.  Psychiatric/Behavioral: Negative.  Negative for depression, hallucinations and suicidal ideas.    Tolerating Diet: npo      DRUG ALLERGIES:   Allergies  Allergen Reactions  . Zocor [Simvastatin] Other (See Comments)    Fever - temp of 103.   In 2003    VITALS:  Blood pressure 110/69, pulse 77, temperature 97.8 F (36.6 C), temperature source Oral, resp. rate 20, height 5' 9.5" (1.765 m), weight 89.8 kg (198 lb), SpO2 97 %.  PHYSICAL EXAMINATION:   Physical Exam  Constitutional: He is oriented to person, place, and time and well-developed, well-nourished, and in no distress. No distress.  HENT:  Head: Normocephalic.  Eyes: No scleral icterus.  Neck: Normal range of motion. Neck supple. No JVD present. No tracheal deviation present.  Cardiovascular: Normal rate, regular rhythm and normal heart sounds.  Exam reveals no gallop and no friction rub.    No murmur heard. Pulmonary/Chest: Effort normal and breath sounds normal. No respiratory distress. He has no wheezes. He has no rales. He exhibits no tenderness.  Abdominal: Soft. Bowel sounds are normal. He exhibits no distension and no mass. There is no tenderness. There is no rebound and no guarding.  Musculoskeletal: Normal range of motion. He exhibits no edema.  Neurological: He is alert and oriented to person, place, and time.  Skin: Skin is warm. No rash noted. No erythema.  Psychiatric: Affect and judgment normal.      LABORATORY PANEL:   CBC  Recent Labs Lab 05/07/16 0535  WBC 5.2  HGB 11.0*  HCT 32.3*  PLT 127*   ------------------------------------------------------------------------------------------------------------------  Chemistries   Recent Labs Lab 05/06/16 1106  05/07/16 0535  NA 137  --  138  K 5.5*  < > 4.6  CL 108  --  112*  CO2 23  --  20*  GLUCOSE 94  --  93  BUN 34*  --  33*  CREATININE 1.25*  --  1.45*  CALCIUM 9.0  --  7.6*  AST 23  --   --   ALT 16*  --   --   ALKPHOS 49  --   --   BILITOT 0.9  --   --   < > = values in this interval not displayed. ------------------------------------------------------------------------------------------------------------------  Cardiac Enzymes  Recent Labs Lab 05/06/16 1106  TROPONINI <0.03   ------------------------------------------------------------------------------------------------------------------  RADIOLOGY:  Nm Gi Blood Loss  Result Date: 05/06/2016 CLINICAL DATA:  76 year old male with rectal bleeding. EXAM: NUCLEAR MEDICINE GASTROINTESTINAL BLEEDING SCAN TECHNIQUE: Sequential abdominal  images were obtained following intravenous administration of Tc-82mlabeled red blood cells. RADIOPHARMACEUTICALS:  mCi Tc-980mn-vitro labeled red cells. COMPARISON:  None. FINDINGS: No abnormal activity identified to suggest active GI bleeding. Normal bladder and penile activity is identified.  IMPRESSION: No evidence of active GI bleeding during the study. The patient experiences active GI bleeding within the next 24 hours, rescanning may be helpful for possible localization. Electronically Signed   By: JeMargarette Canada.D.   On: 05/06/2016 20:32     ASSESSMENT AND PLAN:   [age]ear old male with a history of diverticular bleed, metastatic melanoma and coronary artery disease who presents with bloody stool.  1. Lower GI bleed due to diverticular etiology It appears that hemoglobin is relatively stable. Patient will start on full liquid diet and do GoLYTELY prep as per recommendations by GI If no further bleeding and patient may be discharged later today.   2. Hypothyroidism: TSH was low and therefore Synthroid dose was decreased to 125 mg daily.  3. CAD: Patient may resume his outpatient medications including Coreg, lisinopril and Crestor  4. Depression: Continue Zoloft    Plan of care as outlined above discussed with nurse and GI consultant.  Management plans discussed with the patient and he is in agreement.  CODE STATUS: full  TOTAL TIME TAKING CARE OF THIS PATIENT: 30 minutes.     POSSIBLE D/C today, DEPENDING ON CLINICAL CONDITION.   Davis Vannatter M.D on 05/07/2016 at 9:45 AM  Between 7am to 6pm - Pager - [phone] After 6pm go to [email or web address] - password [email or web address]  Office  [phone]: Primary care physician; RUCharleston PootMD  Note: This dictation was prepared with Dragon dictation along with smaller phrase technology. Any transcriptional errors that result from this process are unintentional.

## 2016-05-07 NOTE — Progress Notes (Signed)
Initial Nutrition Assessment DOCUMENTATION CODES:   Not applicable  INTERVENTION:  -Patient decline ONS, monitor diet advancement, PO intake  NUTRITION DIAGNOSIS:   Inadequate oral intake related to other (see comment) (on CLD/FLD) as evidenced by per patient/family report.  GOAL:   Patient will meet greater than or equal to 90% of their needs  MONITOR:   PO intake, I & O's, Labs  REASON FOR ASSESSMENT:   Malnutrition Screening Tool    ASSESSMENT:   David [name]  is a 76 y.o. male with a known history of Metastatic melanoma, diverticulosis, COPD, hypertension, hyperlipidemia, coronary artery disease, status post double bypass, who presents to the hospital with complaints of rectal bleeding  Spoke with Mr. Mayall at bedside. He reports good PO intake, eating 3 meals per day. Does not consume ensure or boost, or any nutrition supplements, feels he does not need them and that he eats well. Consumed 100% of full liquids this morning during my visit. Reports stable weight between 198-201# - per chart exhibits weight fluctuations, but back at baseline of 198# Also states he consumes sig alcohol, per chart, he consumes 35 cans of beer, 30 glasses of wine per week. Nutrition-Focused physical exam completed. Findings are no fat depletion, mild muscle depletion, and no edema.   Labs and medications reviewed: NS @ 121m/hr  Diet Order:  Diet clear liquid Room service appropriate? Yes; Fluid consistency: Thin  Skin:  Reviewed, no issues  Last BM:  05/06/2016  Height:   Ht Readings from Last 1 Encounters:  05/06/16 5' 9.5" (1.765 m)    Weight:   Wt Readings from Last 1 Encounters:  05/07/16 198 lb (89.8 kg)    Ideal Body Weight:  74.09 kg  BMI:  Body mass index is 28.82 kg/m.  Estimated Nutritional Needs:   Kcal:  1800-2000 calories (MSJ x1.2)  Protein:  90-108 gm  Fluid:  >/= 1.8L  EDUCATION NEEDS:   No education needs identified at this time  WSatira Anis  Jennice Renegar, MS, RD LDN Inpatient Clinical Dietitian Pager [phone]

## 2016-05-07 NOTE — Progress Notes (Signed)
David [name] is being followed for hematochezia Day 2 of follow up   Subjective:  One BM with mostly dark clotted blood this morning that I visualized in his hat He is otherwise feeling well without lightheadedness, dizziness, abdominal pain, nausea or emesis  Objective: Vital signs in last 24 hours: Vitals:   05/06/16 2159 05/07/16 0431 05/07/16 0500 05/07/16 0743  BP: 113/77 (!) 101/56  110/69  Pulse: 78 68  77  Resp:  20    Temp:  97.4 F (36.3 C)  97.8 F (36.6 C)  TempSrc:  Oral  Oral  SpO2:  99%  97%  Weight:   89.8 kg (198 lb)   Height:       Weight change:   Intake/Output Summary (Last 24 hours) at 05/07/16 1252 Last data filed at 05/07/16 1856  Gross per 24 hour  Intake             3175 ml  Output                0 ml  Net             3175 ml     Exam: Gen: older white male laying comfortably in bed Heart:: RRR Lungs: no respiratory distress Abdomen: +BS, soft, NT, ND  Lab Results: '@LABTEST2'$ @ Micro Results: No results found for this or any previous visit (from the past 240 hour(s)). Studies/Results: Nm Gi Blood Loss  Result Date: 05/06/2016 CLINICAL DATA:  76 year old male with rectal bleeding. EXAM: NUCLEAR MEDICINE GASTROINTESTINAL BLEEDING SCAN TECHNIQUE: Sequential abdominal images were obtained following intravenous administration of Tc-83mlabeled red blood cells. RADIOPHARMACEUTICALS:  mCi Tc-935mn-vitro labeled red cells. COMPARISON:  None. FINDINGS: No abnormal activity identified to suggest active GI bleeding. Normal bladder and penile activity is identified. IMPRESSION: No evidence of active GI bleeding during the study. The patient experiences active GI bleeding within the next 24 hours, rescanning may be helpful for possible localization. Electronically Signed   By: David [name].D.   On: 05/06/2016 20:32   Medications: I have reviewed the patient's current medications. Scheduled Meds: . carvedilol  3.125 mg Oral BID WC  . [START ON  05/08/2016] levothyroxine  125 mcg Oral QAC breakfast  . pantoprazole (PROTONIX) IV  40 mg Intravenous Q12H  . polyethylene glycol-electrolytes  4,000 mL Oral Once  . rosuvastatin  10 mg Oral QHS  . sertraline  100 mg Oral Daily  . sodium chloride flush  3 mL Intravenous Q12H   Continuous Infusions: . sodium chloride 125 mL/hr at 05/07/16 0431   PRN Meds:.ondansetron **OR** ondansetron (ZOFRAN) IV   Assessment: Active Problems:   Hematochezia    Plan: David [name] a 89.o. y/o male with a history notable for a diverticular bleeding c/b NSTEMI s/p 2vCABG and metastatic melanoma treated with nivolumab and ipilimumab who is admitted with what is most likely a diverticular bleed. It is quite reassuring that the patient's bleeding self-resolved and he is not having any residual symptoms, which further decreases the likelihood that he has a checkpoint inhibitor induced colitis or other nefarious etiologies of his bleeding. He should certainly be monitored today and he should undergo a colon prep to expel old blood from his colon to ensure that all rectal bleeding is resolved prior to discharge. Should this be the case later tonight or early tomorrow, he is safe to discharge from a GI standpoint.  - advance diet to full liquids today - start Golytely prep until stools have  no more blood, can stop when this is the case, no need to complete full prep or be completely clear as needed for a colonoscopy - monitor Hg q12h - defer CT abdomen/pelvis to outpatient setting if he has clinical concern for colitis  Thank you for involving me in the care of this patient    LOS: 1 day   David [name] 05/07/2016, 12:52 PM

## 2016-05-08 ENCOUNTER — Encounter: Payer: Self-pay | Admitting: Anesthesiology

## 2016-05-08 ENCOUNTER — Encounter
Admission: EM | Disposition: A | Discharge status: Home / Self Care | Payer: Self-pay | Source: Home / Self Care | Attending: Internal Medicine

## 2016-05-08 ENCOUNTER — Inpatient Hospital Stay: Payer: Medicare Other | Admitting: Anesthesiology

## 2016-05-08 DIAGNOSIS — K5730 Diverticulosis of large intestine without perforation or abscess without bleeding: Secondary | ICD-10-CM

## 2016-05-08 DIAGNOSIS — K640 First degree hemorrhoids: Secondary | ICD-10-CM

## 2016-05-08 DIAGNOSIS — K921 Melena: Principal | ICD-10-CM

## 2016-05-08 HISTORY — PX: COLONOSCOPY WITH PROPOFOL: SHX5780

## 2016-05-08 LAB — HEMOGLOBIN: HEMOGLOBIN: 10.1 g/dL — AB (ref 13.0–18.0)

## 2016-05-08 LAB — GLUCOSE, CAPILLARY: GLUCOSE-CAPILLARY: 95 mg/dL (ref 65–99)

## 2016-05-08 SURGERY — COLONOSCOPY WITH PROPOFOL
Anesthesia: General

## 2016-05-08 MED ORDER — LIDOCAINE HCL (PF) 2 % IJ SOLN
INTRAMUSCULAR | Status: DC | PRN
  Administered 2016-05-08: 100 mg via INTRADERMAL

## 2016-05-08 MED ORDER — SODIUM CHLORIDE 0.9 % IV SOLN
INTRAVENOUS | Status: DC
  Administered 2016-05-08: 1000 mL via INTRAVENOUS

## 2016-05-08 MED ORDER — FERROUS SULFATE 325 (65 FE) MG PO TABS
325.0000 mg | ORAL_TABLET | Freq: Every day | ORAL | 0 refills | Status: DC
Start: 2016-05-08 — End: 2016-11-01

## 2016-05-08 MED ORDER — PHENYLEPHRINE HCL 10 MG/ML IJ SOLN
INTRAMUSCULAR | Status: DC | PRN
  Administered 2016-05-08 (×2): 100 ug via INTRAVENOUS
  Administered 2016-05-08: 50 ug via INTRAVENOUS

## 2016-05-08 MED ORDER — LIDOCAINE HCL (PF) 2 % IJ SOLN
INTRAMUSCULAR | Status: AC
  Filled 2016-05-08: qty 2

## 2016-05-08 MED ORDER — LABETALOL HCL 5 MG/ML IV SOLN
INTRAVENOUS | Status: AC
  Filled 2016-05-08: qty 4

## 2016-05-08 MED ORDER — PROPOFOL 10 MG/ML IV BOLUS
INTRAVENOUS | Status: AC
  Filled 2016-05-08: qty 20

## 2016-05-08 MED ORDER — PROPOFOL 10 MG/ML IV BOLUS
INTRAVENOUS | Status: DC | PRN
  Administered 2016-05-08: 50 mg via INTRAVENOUS
  Administered 2016-05-08: 20 mg via INTRAVENOUS

## 2016-05-08 MED ORDER — PROPOFOL 500 MG/50ML IV EMUL
INTRAVENOUS | Status: DC | PRN
  Administered 2016-05-08: 100 ug/kg/min via INTRAVENOUS

## 2016-05-08 MED ORDER — PHENYLEPHRINE HCL 10 MG/ML IJ SOLN
INTRAMUSCULAR | Status: AC
  Filled 2016-05-08: qty 1

## 2016-05-08 NOTE — Addendum Note (Signed)
Encounter addended by: Heywood Footman, RN on: 05/08/2016 12:09 PM<BR>    Actions taken: Charge Capture section accepted

## 2016-05-08 NOTE — Discharge Instructions (Signed)
Resume diet and activity as before ° ° °

## 2016-05-08 NOTE — Anesthesia Postprocedure Evaluation (Signed)
Anesthesia Post Note  Patient: David [name]  Procedure(s) Performed: Procedure(s) (LRB): COLONOSCOPY WITH PROPOFOL (N/A)  Patient location during evaluation: Endoscopy Anesthesia Type: General Level of consciousness: awake and alert Pain management: pain level controlled Vital Signs Assessment: post-procedure vital signs reviewed and stable Respiratory status: spontaneous breathing, nonlabored ventilation, respiratory function stable and patient connected to nasal cannula oxygen Cardiovascular status: blood pressure returned to baseline and stable Postop Assessment: no signs of nausea or vomiting Anesthetic complications: no     Last Vitals:  Vitals:   05/08/16 1024 05/08/16 1034  BP: 135/87 121/86  Pulse: 78 76  Resp: 14 12  Temp:      Last Pain:  Vitals:   05/08/16 1004  TempSrc: Tympanic  PainSc:                  Koral Thaden S

## 2016-05-08 NOTE — Anesthesia Post-op Follow-up Note (Cosign Needed)
Anesthesia QCDR form completed.        

## 2016-05-08 NOTE — Progress Notes (Signed)
Patient reports stool has cleared up, reports no further bloody bm's.

## 2016-05-08 NOTE — Progress Notes (Signed)
Colonoscopy :  1. No blood seen in colon  2. Severe pan colonic diverticulosis 3. Medium internal hemorroids  Plan : 1. Advance diet 2. Likely diverticular bleed that has resolved  3.  I will sign off.  Please call me if any further GI concerns or questions.  We would like to thank you for the opportunity to participate in the care of David [name].   Dr Jonathon Bellows  Gastroenterology/Hepatology Pager: [phone]

## 2016-05-08 NOTE — Op Note (Signed)
[facility] Patient Name: David [name] Procedure Date: 05/08/2016 9:33 AM MRN: [id number] Account #: [id number] Date of Birth: 1940-07-17 Admit Type: Inpatient Age: 76 Room: [facility] ENDO ROOM 4 Gender: Male Note Status: Finalized Procedure:            Colonoscopy Indications:          Rectal bleeding Providers:            Jonathon Bellows MD, MD Referring MD:         No Local Md, MD (Referring MD) Medicines:            Monitored Anesthesia Care Complications:        No immediate complications. Procedure:            Pre-Anesthesia Assessment:                       - Prior to the procedure, a History and Physical was                        performed, and patient medications, allergies and                        sensitivities were reviewed. The patient's tolerance of                        previous anesthesia was reviewed.                       - The risks and benefits of the procedure and the                        sedation options and risks were discussed with the                        patient. All questions were answered and informed                        consent was obtained.                       - The risks and benefits of the procedure and the                        sedation options and risks were discussed with the                        patient. All questions were answered and informed                        consent was obtained.                       - ASA Grade Assessment: IV - A patient with severe                        systemic disease that is a constant threat to life.                       After obtaining informed consent, the colonoscope was  passed under direct vision. Throughout the procedure,                        the patient's blood pressure, pulse, and oxygen                        saturations were monitored continuously. The                        Colonoscope was introduced through the anus and            advanced to the the cecum, identified by the                        appendiceal orifice, IC valve and transillumination.                        The colonoscopy was somewhat difficult due to multiple                        diverticula in the colon. Successful completion of the                        procedure was aided by applying abdominal pressure. The                        patient tolerated the procedure well. The quality of                        the bowel preparation was adequate. Findings:      A moderate amount of semi-liquid stool was found in the ascending colon,       interfering with visualization. Fluid aspiration for cytology was       performed.      Multiple large-mouthed diverticula were found in the entire colon.      Non-bleeding internal hemorrhoids were found during retroflexion. The       hemorrhoids were medium-sized and Grade I (internal hemorrhoids that do       not prolapse).      The exam was otherwise without abnormality on direct and retroflexion       views.      No bright blood or old blood seen in the lumen , stool seen was yellow       in color Impression:           - Stool in the ascending colon. Fluid aspiration                        performed.                       - Diverticulosis in the entire examined colon.                       - Non-bleeding internal hemorrhoids.                       - The examination was otherwise normal on direct and                        retroflexion views. Recommendation:       - Return patient to  hospital ward for ongoing care.                       - Advance diet as tolerated.                       - Continue present medications. Procedure Code(s):    --- Professional ---                       [phone], Colonoscopy, flexible; diagnostic, including                        collection of specimen(s) by brushing or washing, when                        performed (separate procedure) Diagnosis Code(s):    ---  Professional ---                       K64.0, First degree hemorrhoids                       K62.5, Hemorrhage of anus and rectum                       K57.30, Diverticulosis of large intestine without                        perforation or abscess without bleeding CPT copyright 2016 American Medical Association. All rights reserved. The codes documented in this report are preliminary and upon coder review may  be revised to meet current compliance requirements. Jonathon Bellows, MD Jonathon Bellows MD, MD 05/08/2016 10:01:54 AM This report has been signed electronically. Number of Addenda: 0 Note Initiated On: 05/08/2016 9:33 AM Scope Withdrawal Time: 0 hours 9 minutes 33 seconds  Total Procedure Duration: 0 hours 18 minutes 32 seconds       [facility]

## 2016-05-08 NOTE — Plan of Care (Signed)
Problem: Bowel/Gastric: Goal: Will not experience complications related to bowel motility Outcome: Progressing Patient reports that last time to BR was mostly gas and only small amount of stool.

## 2016-05-08 NOTE — Anesthesia Procedure Notes (Signed)
Performed by: Wright Gravely Pre-anesthesia Checklist: Patient identified, Emergency Drugs available, Suction available, Patient being monitored and Timeout performed Patient Re-evaluated:Patient Re-evaluated prior to inductionOxygen Delivery Method: Nasal cannula Preoxygenation: Pre-oxygenation with 100% oxygen Intubation Type: IV induction       

## 2016-05-08 NOTE — Care Management Important Message (Addendum)
Important Message  Patient Details  Name: David [name] MRN: [id number] Date of Birth: April 26, 1940   Medicare Important Message Given:  Yes    Shelbie Ammons, RN 05/08/2016, 12:52 PM

## 2016-05-08 NOTE — Transfer of Care (Signed)
Immediate Anesthesia Transfer of Care Note  Patient: David [name]  Procedure(s) Performed: Procedure(s): COLONOSCOPY WITH PROPOFOL (N/A)  Patient Location: PACU  Anesthesia Type:General  Level of Consciousness: sedated and responds to stimulation  Airway & Oxygen Therapy: Patient Spontanous Breathing and Patient connected to nasal cannula oxygen  Post-op Assessment: Report given to RN and Post -op Vital signs reviewed and stable  Post vital signs: Reviewed and stable  Last Vitals:  Vitals:   05/08/16 0843 05/08/16 1004  BP: 131/83 114/73  Pulse: 77 82  Resp: 20 16  Temp: 36.2 C     Last Pain:  Vitals:   05/08/16 0442  TempSrc: Oral  PainSc:          Complications: No apparent anesthesia complications

## 2016-05-08 NOTE — Anesthesia Preprocedure Evaluation (Signed)
Anesthesia Evaluation  Patient identified by MRN, date of birth, ID band Patient awake    Reviewed: Allergy & Precautions, NPO status , Patient's Chart, lab work & pertinent test results, reviewed documented beta blocker date and time   Airway Mallampati: II  TM Distance: >3 FB     Dental  (+) Chipped   Pulmonary sleep apnea and Continuous Positive Airway Pressure Ventilation , former smoker,           Cardiovascular hypertension, Pt. on medications and Pt. on home beta blockers + angina at rest + CAD and + Past MI  + pacemaker      Neuro/Psych PSYCHIATRIC DISORDERS Anxiety CVA    GI/Hepatic GERD  ,  Endo/Other  Hypothyroidism   Renal/GU Renal disease     Musculoskeletal  (+) Arthritis ,   Abdominal   Peds  Hematology   Anesthesia Other Findings Brain, lung ca from mets.  Reproductive/Obstetrics                             Anesthesia Physical Anesthesia Plan  ASA: IV  Anesthesia Plan: General   Post-op Pain Management:    Induction: Intravenous  Airway Management Planned: Nasal Cannula  Additional Equipment:   Intra-op Plan:   Post-operative Plan:   Informed Consent: I have reviewed the patients History and Physical, chart, labs and discussed the procedure including the risks, benefits and alternatives for the proposed anesthesia with the patient or authorized representative who has indicated his/her understanding and acceptance.     Plan Discussed with: CRNA  Anesthesia Plan Comments:         Anesthesia Quick Evaluation

## 2016-05-08 NOTE — H&P (Signed)
Jonathon Bellows MD [address], [place], [place] [address] Phone: [phone] Fax : [phone]  Primary Care Physician:  [name], MD Primary Gastroenterologist:  Dr. Jonathon Bellows   Pre-Procedure History & Physical: HPI:  David [name] is a 76 y.o. male is here for an colonoscopy.   Past Medical History:  Diagnosis Date  . Anginal pain ([place])   . Arthritis    mostly hands  . Benign familial hematuria   . BPH (benign prostatic hypertrophy)   . Brain cancer ([place])    melanoma with brain met  . Coronary artery disease   . GERD (gastroesophageal reflux disease)   . High cholesterol   . Hypertension   . Hypothyroidism   . Intracerebral hemorrhage ([place])   . Melanoma ([place])   . Metastatic melanoma to lung ([place])   . Mood disorder ([place])   . Myocardial infarction   . Pacemaker    due to syncope, 3rd degree HB (upgrade to [facility]. Jude CRT-P 02/25/13 (Dr. Uvaldo Rising)  . Rectal bleed    due to NSAIDS  . Renal disorder   . Skin cancer    melanoma  . Sleep apnea    uses CPAP  . Stroke [facility])     Past Surgical History:  Procedure Laterality Date  . APPLICATION OF CRANIAL NAVIGATION N/A 10/15/2015   Procedure: APPLICATION OF CRANIAL NAVIGATION;  Surgeon: Erline Levine, MD;  Location: [place] NEURO ORS;  Service: Neurosurgery;  Laterality: N/A;  . CARDIAC CATHETERIZATION  2013  . CARDIAC SURGERY     bypass X 2  . CORONARY ARTERY BYPASS GRAFT  2013   LIMA-LAD, SVG-PDA 03/27/11 (Dr. Francee Gentile)  . CRANIOTOMY N/A 10/15/2015   Procedure: LEFT FRONTAL CRANIOTOMY TUMOR EXCISION with Curve;  Surgeon: Erline Levine, MD;  Location: [place] NEURO ORS;  Service: Neurosurgery;  Laterality: N/A;  CRANIOTOMY TUMOR EXCISION  . EYE SURGERY    . FOOT SURGERY  08/2010  . JOINT REPLACEMENT Left    partial knee  . KNEE ARTHROSCOPY  12/2008  . LYMPH NODE BIOPSY    . PACEMAKER INSERTION    . TONSILLECTOMY      Prior to Admission medications   Medication Sig Start Date End Date Taking? Authorizing Provider    acetaminophen (TYLENOL) 325 MG tablet Take 2 tablets (650 mg total) by mouth every 6 (six) hours as needed for mild pain. Patient taking differently: Take 650 mg by mouth 2 (two) times daily.  09/09/15  Yes Ivan Anchors Love, PA-C  carvedilol (COREG) 6.25 MG tablet Take 6.25 mg by mouth 2 (two) times daily with a meal.   Yes Historical Provider, MD  levothyroxine (SYNTHROID, LEVOTHROID) 125 MCG tablet TAKE 1 TABLET BY MOUTH EVERY MORNING BEFORE BREAKFAST Patient taking differently: Take  175 mcg  daily 11/19/15  Yes Venia Carbon, MD  lisinopril (PRINIVIL,ZESTRIL) 5 MG tablet Take 1 tablet by mouth daily. 04/11/16  Yes Historical Provider, MD  Multiple Vitamin (MULTIVITAMIN WITH MINERALS) TABS tablet Take 1 tablet by mouth daily.   Yes Historical Provider, MD  rosuvastatin (CRESTOR) 10 MG tablet Take 10 mg by mouth at bedtime.   Yes Historical Provider, MD  sertraline (ZOLOFT) 100 MG tablet Take 1 tablet by mouth daily. 02/29/16  Yes Historical Provider, MD  Turmeric Curcumin 500 MG CAPS Take 1,000 mg by mouth 2 (two) times daily.    Yes Historical Provider, MD  levothyroxine (SYNTHROID, LEVOTHROID) 125 MCG tablet Take 1 tablet (125 mcg total) by mouth daily before breakfast. 05/08/16  Bettey Costa, MD  PRESCRIPTION MEDICATION Inhale into the lungs at bedtime. CPAP    Historical Provider, MD  PREVNAR 13 SUSP injection ADM 0.5ML IM UTD 01/22/16   Historical Provider, MD  UNABLE TO FIND Speech Therapy -  Please evaluate pt for  Partial  Aphasia and  Treat 04/06/16   [name], MD    Allergies as of 05/06/2016 - Review Complete 05/06/2016  Allergen Reaction Noted  . Zocor [simvastatin] Other (See Comments) 09/17/2015    Family History  Problem Relation Age of Onset  . Cancer Mother 32    lung   . Hypertension Mother   . Hypertension Father   . Heart attack Father   . Heart disease Father   . Rheum arthritis Sister     Social History   Social History  . Marital status: Married    Spouse  name: N/A  . Number of children: 2  . Years of education: N/A   Occupational History  . retired Pharmacist, community    Social History Main Topics  . Smoking status: Former Smoker    Packs/day: 1.00    Years: 3.00    Quit date: 04/11/1919  . Smokeless tobacco: Never Used  . Alcohol use 39.0 oz/week    35 Cans of beer, 30 Glasses of wine per week     Comment: daily for the past 10 years - last few weeks none  . Drug use: No  . Sexual activity: No   Other Topics Concern  . Not on file   Social History Narrative   Wife has dementia   Daughter [name] and son      Has living will   [name] is health care POA   Requests DNR-- done 09/14/15   Not sure about tube feeds    Review of Systems: See HPI, otherwise negative ROS  Physical Exam: BP 131/83   Pulse 77   Temp 97.1 F (36.2 C)   Resp 20   Ht 5' 9.5" (1.765 m)   Wt 194 lb 8 oz (88.2 kg)   SpO2 97%   BMI 28.31 kg/m  General:   Alert,  pleasant and cooperative in NAD Head:  Normocephalic and atraumatic. Neck:  Supple; no masses or thyromegaly. Lungs:  Clear throughout to auscultation.    Heart:  Regular rate and rhythm. Abdomen:  Soft, nontender and nondistended. Normal bowel sounds, without guarding, and without rebound.   Neurologic:  Alert and  oriented x4;  grossly normal neurologically.  Impression/Plan: David [name] is here for an colonoscopy to be performed for rectal bleeding   Risks, benefits, limitations, and alternatives regarding  colonoscopy have been reviewed with the patient.  Questions have been answered.  All parties agreeable.   Jonathon Bellows, MD  05/08/2016, 8:58 AM

## 2016-05-08 NOTE — Progress Notes (Signed)
Pt was discharged today, IV x2 removed, all belongings packed and returned to patient. Discharge instructions reviewed with patient, he verified understanding. 1 paper prescription given to patient. He was rolled out in wheelchair by staff.

## 2016-05-09 ENCOUNTER — Encounter: Payer: Self-pay | Admitting: Gastroenterology

## 2016-05-09 NOTE — Discharge Summary (Signed)
[facility] NAME: David [name]    MR#:  [id number]  DATE OF BIRTH:  [birth year]  DATE OF ADMISSION:  05/06/2016 ADMITTING PHYSICIAN: Theodoro Grist, MD  DATE OF DISCHARGE: 05/08/2016  2:48 PM  PRIMARY CARE PHYSICIAN: [name], MD   ADMISSION DIAGNOSIS:  Hematochezia [K92.1]  DISCHARGE DIAGNOSIS:  Active Problems:   Hematochezia   SECONDARY DIAGNOSIS:   Past Medical History:  Diagnosis Date  . Anginal pain ([place])   . Arthritis    mostly hands  . Benign familial hematuria   . BPH (benign prostatic hypertrophy)   . Brain cancer ([place])    melanoma with brain met  . Coronary artery disease   . GERD (gastroesophageal reflux disease)   . High cholesterol   . Hypertension   . Hypothyroidism   . Intracerebral hemorrhage ([place])   . Melanoma ([place])   . Metastatic melanoma to lung ([place])   . Mood disorder ([place])   . Myocardial infarction   . Pacemaker    due to syncope, 3rd degree HB (upgrade to [facility]. Jude CRT-P 02/25/13 (Dr. Uvaldo Rising)  . Rectal bleed    due to NSAIDS  . Renal disorder   . Skin cancer    melanoma  . Sleep apnea    uses CPAP  . Stroke [facility])      ADMITTING HISTORY  HISTORY OF PRESENT ILLNESS: David [name]  is a 76 y.o. male with a known history of Metastatic melanoma, diverticulosis, COPD, hypertension, hyperlipidemia, coronary artery disease, status post double bypass, who presents to the hospital with complaints of rectal bleeding. According to the patient, he was well up until 9 AM, when he felt that he needed to go to the bathroom to have bowel movement. He did not have much of a bowel movement, however, had bright red blood in the commode, he thinks that it was about half of cup of blood. He had at least 6 or 7 episodes of bleeding. Today, he denies any lightheadedness, dizziness or shortness of breath, chest pains or feeling presyncopal. He has no abdominal pains. No nausea, vomiting. He had a few episodes  of diarrheal stool yesterday, resolved now. Hospitalist services were contacted for admission  HOSPITAL COURSE:   * Lower GI bleed duiverticulosis * acute blood loss anemia * hypothyroidism * CAD  Patient was admitted onto medical floor after noticing blood in his stool. This was thought to be diverticular bleed. Patient was scheduled for a colonoscopy and colonoscopy showed no acute bleeding but confirmed diverticulosis. He also had some hemorrhoids found on colonoscopy. Patient's hemoglobin remained stable by day of discharge. He had multiple bowel movements with GoLYTELY prep with no blood. Patient is being discharged home in stable condition on iron supplementation. He has been advised to hold his blood thinners for 1 week after discharge.  Stable for discharge home. Primary care physician follow-up in one week for repeat hemoglobin check.  CONSULTS OBTAINED:    DRUG ALLERGIES:   Allergies  Allergen Reactions  . Zocor [Simvastatin] Other (See Comments)    Fever - temp of 103.   In 2003    DISCHARGE MEDICATIONS:   Discharge Medication List as of 05/08/2016  1:02 PM    START taking these medications   Details  ferrous sulfate 325 (65 FE) MG tablet Take 1 tablet (325 mg total) by mouth daily with breakfast., Starting Mon 05/08/2016, Normal      CONTINUE these medications which have CHANGED  Details  levothyroxine (SYNTHROID, LEVOTHROID) 125 MCG tablet Take 1 tablet (125 mcg total) by mouth daily before breakfast., Starting Mon 05/08/2016, Print      CONTINUE these medications which have NOT CHANGED   Details  acetaminophen (TYLENOL) 325 MG tablet Take 2 tablets (650 mg total) by mouth every 6 (six) hours as needed for mild pain., Starting 09/09/2015, Until Discontinued, OTC    carvedilol (COREG) 6.25 MG tablet Take 6.25 mg by mouth 2 (two) times daily with a meal., Historical Med    lisinopril (PRINIVIL,ZESTRIL) 5 MG tablet Take 1 tablet by mouth daily., Starting Tue  04/11/2016, Historical Med    Multiple Vitamin (MULTIVITAMIN WITH MINERALS) TABS tablet Take 1 tablet by mouth daily., Until Discontinued, Historical Med    rosuvastatin (CRESTOR) 10 MG tablet Take 10 mg by mouth at bedtime., Until Discontinued, Historical Med    sertraline (ZOLOFT) 100 MG tablet Take 1 tablet by mouth daily., Starting Tue 02/29/2016, Historical Med    Turmeric Curcumin 500 MG CAPS Take 1,000 mg by mouth 2 (two) times daily. , Historical Med    PRESCRIPTION MEDICATION Inhale into the lungs at bedtime. CPAP, Until Discontinued, Historical Med      STOP taking these medications     PREVNAR 13 SUSP injection      UNABLE TO FIND         Today   VITAL SIGNS:  Blood pressure 121/86, pulse 76, temperature (!) 96.3 F (35.7 C), temperature source Tympanic, resp. rate 12, height 5' 9.5" (1.765 m), weight 88.2 kg (194 lb 8 oz), SpO2 99 %.  I/O:  No intake or output data in the 24 hours ending 05/09/16 1631  PHYSICAL EXAMINATION:  Physical Exam  GENERAL:  76 y.o.-year-old patient lying in the bed with no acute distress.  LUNGS: Normal breath sounds bilaterally, no wheezing, rales,rhonchi or crepitation. No use of accessory muscles of respiration.  CARDIOVASCULAR: S1, S2 normal. No murmurs, rubs, or gallops.  ABDOMEN: Soft, non-tender, non-distended. Bowel sounds present. No organomegaly or mass.  NEUROLOGIC: Moves all 4 extremities. PSYCHIATRIC: The patient is alert and oriented x 3.  SKIN: No obvious rash, lesion, or ulcer.   DATA REVIEW:   CBC  Recent Labs Lab 05/07/16 0535  05/08/16 0041  WBC 5.2  --   --   HGB 11.0*  < > 10.1*  HCT 32.3*  --   --   PLT 127*  --   --   < > = values in this interval not displayed.  Chemistries   Recent Labs Lab 05/06/16 1106  05/07/16 0535  NA 137  --  138  K 5.5*  < > 4.6  CL 108  --  112*  CO2 23  --  20*  GLUCOSE 94  --  93  BUN 34*  --  33*  CREATININE 1.25*  --  1.45*  CALCIUM 9.0  --  7.6*  AST 23  --    --   ALT 16*  --   --   ALKPHOS 49  --   --   BILITOT 0.9  --   --   < > = values in this interval not displayed.  Cardiac Enzymes  Recent Labs Lab 05/06/16 1106  Elizabeth <0.03    Microbiology Results  Results for orders placed or performed during the hospital encounter of 10/15/15  MRSA PCR Screening     Status: None   Collection Time: 10/15/15 12:16 PM  Result Value Ref Range Status  MRSA by PCR NEGATIVE NEGATIVE Final    Comment:        The GeneXpert MRSA Assay (FDA approved for NASAL specimens only), is one component of a comprehensive MRSA colonization surveillance program. It is not intended to diagnose MRSA infection nor to guide or monitor treatment for MRSA infections.     RADIOLOGY:  No results found.  Follow up with PCP in 1 week.  Management plans discussed with the patient, family and they are in agreement.  CODE STATUS:  Code Status History    Date Active Date Inactive Code Status Order ID Comments User Context   05/06/2016  2:12 PM 05/08/2016  5:54 PM Full Code [id number]  Theodoro Grist, MD Inpatient   10/15/2015  1:19 PM 10/21/2015  3:34 PM Full Code [id number]  Erline Levine, MD Inpatient   08/26/2015  4:51 PM 09/10/2015  6:19 PM Full Code [id number]  Bary Leriche, PA-C Inpatient   08/23/2015 12:35 AM 08/26/2015  4:51 PM Full Code [id number]  Wallie Char Inpatient    Advance Directive Documentation   Flowsheet Row Most Recent Value  Type of Advance Directive  Healthcare Power of Attorney, Living will  Pre-existing out of facility DNR order (yellow form or pink MOST form)  No data  "MOST" Form in Place?  No data      TOTAL TIME TAKING CARE OF THIS PATIENT ON DAY OF DISCHARGE: more than 30 minutes.   Hillary Bow R M.D on 05/09/2016 at 4:31 PM  Between 7am to 6pm - Pager - [phone]  After 6pm go to [email or web address] - password [email or web address] [facility]  Office  [phone]  CC: Primary care physician; [name],  MD  Note: This dictation was prepared with Dragon dictation along with smaller phrase technology. Any transcriptional errors that result from this process are unintentional.

## 2016-05-10 DIAGNOSIS — E039 Hypothyroidism, unspecified: Secondary | ICD-10-CM | POA: Diagnosis not present

## 2016-05-11 ENCOUNTER — Other Ambulatory Visit: Payer: Self-pay | Admitting: Hematology

## 2016-05-11 DIAGNOSIS — C439 Malignant melanoma of skin, unspecified: Secondary | ICD-10-CM

## 2016-05-11 DIAGNOSIS — C799 Secondary malignant neoplasm of unspecified site: Secondary | ICD-10-CM

## 2016-05-15 ENCOUNTER — Telehealth: Payer: Self-pay | Admitting: Hematology

## 2016-05-15 NOTE — Telephone Encounter (Signed)
Spoke with the patient and confirmed appointment on 2/7

## 2016-05-17 ENCOUNTER — Other Ambulatory Visit (HOSPITAL_BASED_OUTPATIENT_CLINIC_OR_DEPARTMENT_OTHER): Payer: Medicare Other

## 2016-05-17 ENCOUNTER — Ambulatory Visit (HOSPITAL_BASED_OUTPATIENT_CLINIC_OR_DEPARTMENT_OTHER): Payer: Medicare Other | Admitting: Hematology

## 2016-05-17 ENCOUNTER — Encounter: Payer: Self-pay | Admitting: Hematology

## 2016-05-17 ENCOUNTER — Ambulatory Visit (HOSPITAL_BASED_OUTPATIENT_CLINIC_OR_DEPARTMENT_OTHER): Payer: Medicare Other

## 2016-05-17 VITALS — BP 120/80 | HR 77 | Temp 98.4°F | Resp 16

## 2016-05-17 DIAGNOSIS — C770 Secondary and unspecified malignant neoplasm of lymph nodes of head, face and neck: Secondary | ICD-10-CM

## 2016-05-17 DIAGNOSIS — C799 Secondary malignant neoplasm of unspecified site: Secondary | ICD-10-CM

## 2016-05-17 DIAGNOSIS — C7951 Secondary malignant neoplasm of bone: Secondary | ICD-10-CM

## 2016-05-17 DIAGNOSIS — I10 Essential (primary) hypertension: Secondary | ICD-10-CM

## 2016-05-17 DIAGNOSIS — C7800 Secondary malignant neoplasm of unspecified lung: Secondary | ICD-10-CM | POA: Diagnosis not present

## 2016-05-17 DIAGNOSIS — K922 Gastrointestinal hemorrhage, unspecified: Secondary | ICD-10-CM

## 2016-05-17 DIAGNOSIS — Z8673 Personal history of transient ischemic attack (TIA), and cerebral infarction without residual deficits: Secondary | ICD-10-CM

## 2016-05-17 DIAGNOSIS — R5383 Other fatigue: Secondary | ICD-10-CM

## 2016-05-17 DIAGNOSIS — C439 Malignant melanoma of skin, unspecified: Secondary | ICD-10-CM

## 2016-05-17 DIAGNOSIS — C7931 Secondary malignant neoplasm of brain: Secondary | ICD-10-CM

## 2016-05-17 DIAGNOSIS — E875 Hyperkalemia: Secondary | ICD-10-CM | POA: Diagnosis not present

## 2016-05-17 DIAGNOSIS — E039 Hypothyroidism, unspecified: Secondary | ICD-10-CM

## 2016-05-17 DIAGNOSIS — D500 Iron deficiency anemia secondary to blood loss (chronic): Secondary | ICD-10-CM | POA: Diagnosis not present

## 2016-05-17 DIAGNOSIS — Z5112 Encounter for antineoplastic immunotherapy: Secondary | ICD-10-CM | POA: Diagnosis present

## 2016-05-17 LAB — CBC WITH DIFFERENTIAL/PLATELET
BASO%: 0.6 % (ref 0.0–2.0)
BASOS ABS: 0 10*3/uL (ref 0.0–0.1)
EOS ABS: 1 10*3/uL — AB (ref 0.0–0.5)
EOS%: 16.7 % — AB (ref 0.0–7.0)
HCT: 35.2 % — ABNORMAL LOW (ref 38.4–49.9)
HGB: 11.9 g/dL — ABNORMAL LOW (ref 13.0–17.1)
LYMPH%: 9.8 % — AB (ref 14.0–49.0)
MCH: 31.4 pg (ref 27.2–33.4)
MCHC: 33.8 g/dL (ref 32.0–36.0)
MCV: 93 fL (ref 79.3–98.0)
MONO#: 0.6 10*3/uL (ref 0.1–0.9)
MONO%: 10 % (ref 0.0–14.0)
NEUT#: 3.7 10*3/uL (ref 1.5–6.5)
NEUT%: 62.9 % (ref 39.0–75.0)
Platelets: 218 10*3/uL (ref 140–400)
RBC: 3.79 10*6/uL — AB (ref 4.20–5.82)
RDW: 16 % — ABNORMAL HIGH (ref 11.0–14.6)
WBC: 5.8 10*3/uL (ref 4.0–10.3)
lymph#: 0.6 10*3/uL — ABNORMAL LOW (ref 0.9–3.3)

## 2016-05-17 LAB — COMPREHENSIVE METABOLIC PANEL
ALK PHOS: 53 U/L (ref 40–150)
ALT: 19 U/L (ref 0–55)
AST: 21 U/L (ref 5–34)
Albumin: 3.8 g/dL (ref 3.5–5.0)
Anion Gap: 8 mEq/L (ref 3–11)
BUN: 30.6 mg/dL — AB (ref 7.0–26.0)
CHLORIDE: 105 meq/L (ref 98–109)
CO2: 22 mEq/L (ref 22–29)
Calcium: 9.6 mg/dL (ref 8.4–10.4)
Creatinine: 1.5 mg/dL — ABNORMAL HIGH (ref 0.7–1.3)
EGFR: 46 mL/min/{1.73_m2} — AB (ref >90)
GLUCOSE: 88 mg/dL (ref 70–140)
Potassium: 5.8 mEq/L — ABNORMAL HIGH (ref 3.5–5.1)
SODIUM: 135 meq/L — AB (ref 136–145)
Total Bilirubin: 0.52 mg/dL (ref 0.20–1.20)
Total Protein: 6.7 g/dL (ref 6.4–8.3)

## 2016-05-17 LAB — TSH: TSH: 0.81 m[IU]/L (ref 0.320–4.118)

## 2016-05-17 MED ORDER — SODIUM POLYSTYRENE SULFONATE 15 GM/60ML PO SUSP: 15.0000 g | Freq: Once | ORAL | 0 refills | Status: AC

## 2016-05-17 MED ORDER — SODIUM CHLORIDE 0.9 % IV SOLN
Freq: Once | INTRAVENOUS | Status: AC
  Administered 2016-05-17: 14:00:00 via INTRAVENOUS

## 2016-05-17 MED ORDER — SODIUM CHLORIDE 0.9 % IV SOLN
240.0000 mg | Freq: Once | INTRAVENOUS | Status: AC
  Administered 2016-05-17: 240 mg via INTRAVENOUS
  Filled 2016-05-17: qty 20

## 2016-05-17 NOTE — Progress Notes (Signed)
Creatinine 1.5 and Potassium 5.8 today, MD Feng notified. IVF added (see eMAR) per MD Burr Medico.

## 2016-05-17 NOTE — Patient Instructions (Addendum)
[facility] Discharge Instructions for Patients Receiving Chemotherapy  Today you received the following chemotherapy agents Opdivo   To help prevent nausea and vomiting after your treatment, we encourage you to take your nausea medication as directed.    If you develop nausea and vomiting that is not controlled by your nausea medication, call the clinic.   BELOW ARE SYMPTOMS THAT SHOULD BE REPORTED IMMEDIATELY:  *FEVER GREATER THAN 100.5 F  *CHILLS WITH OR WITHOUT FEVER  NAUSEA AND VOMITING THAT IS NOT CONTROLLED WITH YOUR NAUSEA MEDICATION  *UNUSUAL SHORTNESS OF BREATH  *UNUSUAL BRUISING OR BLEEDING  TENDERNESS IN MOUTH AND THROAT WITH OR WITHOUT PRESENCE OF ULCERS  *URINARY PROBLEMS  *BOWEL PROBLEMS  UNUSUAL RASH Items with * indicate a potential emergency and should be followed up as soon as possible.  Feel free to call the clinic you have any questions or concerns. The clinic phone number is [phone].  Please show the [place] at check-in to the Emergency Department and triage nurse.   Dehydration, Adult Dehydration is a condition in which there is not enough fluid or water in the body. This happens when you lose more fluids than you take in. Important organs, such as the kidneys, brain, and heart, cannot function without a proper amount of fluids. Any loss of fluids from the body can lead to dehydration. Dehydration can range from mild to severe. This condition should be treated right away to prevent it from becoming severe. What are the causes? This condition may be caused by:  Vomiting.  Diarrhea.  Excessive sweating, such as from heat exposure or exercise.  Not drinking enough fluid, especially:  When ill.  While doing activity that requires a lot of energy.  Excessive urination.  Fever.  Infection.  Certain medicines, such as medicines that cause the body to lose excess fluid (diuretics).  Inability to access safe  drinking water.  Reduced physical ability to get adequate water and food. What increases the risk? This condition is more likely to develop in people:  Who have a poorly controlled long-term (chronic) illness, such as diabetes, heart disease, or kidney disease.  Who are age 75 or older.  Who are disabled.  Who live in a place with high altitude.  Who play endurance sports. What are the signs or symptoms? Symptoms of mild dehydration may include:  Thirst.  Dry lips.  Slightly dry mouth.  Dry, warm skin.  Dizziness. Symptoms of moderate dehydration may include:  Very dry mouth.  Muscle cramps.  Dark urine. Urine may be the color of tea.  Decreased urine production.  Decreased tear production.  Heartbeat that is irregular or faster than normal (palpitations).  Headache.  Light-headedness, especially when you stand up from a sitting position.  Fainting (syncope). Symptoms of severe dehydration may include:  Changes in skin, such as:  Cold and clammy skin.  Blotchy (mottled) or pale skin.  Skin that does not quickly return to normal after being lightly pinched and released (poor skin turgor).  Changes in body fluids, such as:  Extreme thirst.  No tear production.  Inability to sweat when body temperature is high, such as in hot weather.  Very little urine production.  Changes in vital signs, such as:  Weak pulse.  Pulse that is more than 100 beats a minute when sitting still.  Rapid breathing.  Low blood pressure.  Other changes, such as:  Sunken eyes.  Cold hands and feet.  Confusion.  Lack of  energy (lethargy).  Difficulty waking up from sleep.  Short-term weight loss.  Unconsciousness. How is this diagnosed? This condition is diagnosed based on your symptoms and a physical exam. Blood and urine tests may be done to help confirm the diagnosis. How is this treated? Treatment for this condition depends on the severity. Mild or  moderate dehydration can often be treated at home. Treatment should be started right away. Do not wait until dehydration becomes severe. Severe dehydration is an emergency and it needs to be treated in a hospital. Treatment for mild dehydration may include:  Drinking more fluids.  Replacing salts and minerals in your blood (electrolytes) that you may have lost. Treatment for moderate dehydration may include:  Drinking an oral rehydration solution (ORS). This is a drink that helps you replace fluids and electrolytes (rehydrate). It can be found at pharmacies and retail stores. Treatment for severe dehydration may include:  Receiving fluids through an IV tube.  Receiving an electrolyte solution through a feeding tube that is passed through your nose and into your stomach (nasogastric tube, or NG tube).  Correcting any abnormalities in electrolytes.  Treating the underlying cause of dehydration. Follow these instructions at home:  If directed by your health care provider, drink an ORS:  Make an ORS by following instructions on the package.  Start by drinking small amounts, about  cup (120 mL) every 5-10 minutes.  Slowly increase how much you drink until you have taken the amount recommended by your health care provider.  Drink enough clear fluid to keep your urine clear or pale yellow. If you were told to drink an ORS, finish the ORS first, then start slowly drinking other clear fluids. Drink fluids such as:  Water. Do not drink only water. Doing that can lead to having too little salt (sodium) in the body (hyponatremia).  Ice chips.  Fruit juice that you have added water to (diluted fruit juice).  Low-calorie sports drinks.  Avoid:  Alcohol.  Drinks that contain a lot of sugar. These include high-calorie sports drinks, fruit juice that is not diluted, and soda.  Caffeine.  Foods that are greasy or contain a lot of fat or sugar.  Take over-the-counter and prescription  medicines only as told by your health care provider.  Do not take sodium tablets. This can lead to having too much sodium in the body (hypernatremia).  Eat foods that contain a healthy balance of electrolytes, such as bananas, oranges, potatoes, tomatoes, and spinach.  Keep all follow-up visits as told by your health care provider. This is important. Contact a health care provider if:  You have abdominal pain that:  Gets worse.  Stays in one area (localizes).  You have a rash.  You have a stiff neck.  You are more irritable than usual.  You are sleepier or more difficult to wake up than usual.  You feel weak or dizzy.  You feel very thirsty.  You have urinated only a small amount of very dark urine over 6-8 hours. Get help right away if:  You have symptoms of severe dehydration.  You cannot drink fluids without vomiting.  Your symptoms get worse with treatment.  You have a fever.  You have a severe headache.  You have vomiting or diarrhea that:  Gets worse.  Does not go away.  You have blood or green matter (bile) in your vomit.  You have blood in your stool. This may cause stool to look black and tarry.  You have  not urinated in 6-8 hours.  You faint.  Your heart rate while sitting still is over 100 beats a minute.  You have trouble breathing. This information is not intended to replace advice given to you by your health care provider. Make sure you discuss any questions you have with your health care provider. Document Released: 03/27/2005 Document Revised: 10/22/2015 Document Reviewed: 05/21/2015 Elsevier Interactive Patient Education  2017 [employer].

## 2016-05-17 NOTE — Progress Notes (Signed)
[name]  Telephone:(336) [phone] Fax:(336) [phone]  Clinic Follow up Note   Patient Care Team: [name], MD as PCP - General (Internal Medicine)   Chief complaint: Follow-up metastatic melanoma  Oncology History   Metastatic melanoma [facility])   Staging form: Melanoma of the Skin, AJCC 7th Edition     Clinical stage from 09/21/2015: Stage IV [name], [place], [name]) - Signed by [name], MD on 10/09/2015       Metastatic melanoma ([name])   09/16/2015 Imaging    PET scan showed a hypermetabolic 5.3ZJ lingular nodule, and a 62m right upper lobe lung nodule, left subclavicular nodes, and bone metastasis in the proximal right humerus and right femur.       09/21/2015 Initial Diagnosis    [place]astatic [place]      09/21/2015 Initial Biopsy    Left subclavicular lymph node biopsy showed prostatic of melanoma      09/21/2015 Miscellaneous    BRAF V600K mutation (+)       10/04/2015 Imaging    CT head with without contrast showed a 2.0 x 1.6 x 1.7 cm superficial left frontal hyperdense lesion, with postcontrast enhancement, lying within the previous hemorrhagic area, concerning for solitary melanoma metastasis      10/06/2015 -  Chemotherapy    Nivolumab 2472mevery 2 weeks       10/13/2015 - 10/13/2015 Radiation Therapy    10/13/2015 Preop SRS treatment: 14 Gy  to the left frontal lesion in 1 fraction      10/15/2015 Surgery    left front craniotomy and tumor excision       10/15/2015 Pathology Results    left frontal brain mass excision showed metastatic melanoma, 1.5X1.5X0.6cm      01/13/2016 Imaging    CT head without and with contrast showed ill-defined enhancement and dural thickening of the left frontal resection cavity may represent a post treatment changes or residual neoplasm. A new 6 mm hypodensity in the right cerebellar hemisphere with uncertainty enhancement may represent a metastasis or small brain parenchymal hemorrhage.       01/27/2016 - 01/27/2016  Radiation Therapy    01/27/16 SRS Treatment:  20 Gy in 1 fraction to a 6 mm right cerebellar brain met       03/29/2016 PET scan    PET 03/29/2016 IMPRESSION: 1. Overall there has been an interval response to therapy. The index lesions sided on previous exam it are less hypermetabolic when compared with the previous exam. 2. There is an intramuscular lesion within the right masseter which has increased FDG uptake compared with previous exam. 3. No new sites of disease identified. 4. Aortic atherosclerosis and infrarenal abdominal aortic aneurysm. Recommend followup by ultrasound in 3 years. This recommendation follows ACR consensus guidelines: White Paper of the ACR Incidental Findings Committee II on Vascular Findings. J Joellyn Ruedadiol 2013; 10:789-794      04/20/2016 Imaging    CT Head w wo contrast 1. Mild patchy enhancement at the left anterior frontal lobe treatment site (series 11, image 39) without mass effect and stable surrounding white matter hypodensity without strong evidence of acute vasogenic edema. Recommend continued CT surveillance. 2. No new metastatic disease or new intracranial abnormality identified. 3. Probable benign 14 mm left parietal bone lucent lesion, unchanged since May 2017.      05/06/2016 - 05/08/2016 Hospital Admission    Pt was admitted for rectal bleeding for one day. Breathing spontaneously resolved, hemoglobin dropped from 15 to 10.5,  did not require blood transfusion. Colonoscopy showed no active bleeding, except diverticulosis and hemorrhoid. He was discharged to home.       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 He was found to have left frontal hemorrhage 5.7 cm x 4.5 cm with 6 mm left to right shift.During the workup for his stroke, a left upper lung mass was noticed on the CT scan. I was called to evaluate his  lung mass.  Patient has aphasia, not able to communicate much. According to his daughter, Dr. Gala [name], who is a OB GYN at [employer], he probably only smoked for very short period time during the college. He did have 3 skin melanoma, which was removed, last one was 2-3 years ago. No other cancer history.  CURRENT THERAPY: Nivolumab 240 mg, every 2 weeks, started on 10/06/2015  INTERVAL HISTORY: David [name] returns for follow-up and nivolumab therapy. He was admitted to [facility] for rectal bleeding on 05/06/2016. His rectal bleeding spontaneously resolved. His hemoglobin did drop significantly, but did not require blood transfusion. He underwent colonoscopy, which showed diverticulosis and hemorrhoids, no active bleeding. He was discharged home today status. He has been feeling well overall, restarted going to gem with his son 3 times a week, and she feels better. He has been taking oral iron since he hospital stay 10 days ago. No other new complaints.  REVIEW OF SYSTEMS:  Constitutional: Denies fevers, chills or abnormal weight loss Eyes: Denies blurriness of vision Ears, nose, mouth, throat, and face: Denies mucositis or sore throat Respiratory: Denies cough, dyspnea or wheezes Cardiovascular: Denies palpitation, chest discomfort or lower extremity swelling Gastrointestinal:  Denies nausea, heartburn or change in bowel habits Skin: Denies abnormal skin rashes Lymphatics: Denies new lymphadenopathy or easy bruising Neurological:see HPI  Behavioral/Psych: Mood is stable, no new changes  All other systems were reviewed with the patient and are negative.  MEDICAL HISTORY:  Past Medical History:  Diagnosis Date  . Anginal pain (David [name])   . Arthritis    mostly hands  . Benign familial hematuria   . BPH (benign prostatic hypertrophy)   . Brain cancer (David [name])    melanoma with brain met  . Coronary artery disease   . GERD (gastroesophageal reflux disease)   .  High cholesterol   . Hypertension   . Hypothyroidism   . Intracerebral hemorrhage ([place])   . Melanoma (David [name])   . Metastatic melanoma to lung (David [name])   . Mood disorder ([place])   . Myocardial infarction   . Pacemaker    due to syncope, 3rd degree HB (upgrade to [facility]. Jude CRT-P 02/25/13 (Dr. Uvaldo Rising)  . Rectal bleed    due to NSAIDS  . Renal disorder   . Skin cancer    melanoma  . Sleep apnea    uses CPAP  . Stroke [facility])     SURGICAL HISTORY: Past Surgical History:  Procedure Laterality Date  . APPLICATION OF CRANIAL NAVIGATION N/A 10/15/2015   Procedure: APPLICATION OF CRANIAL NAVIGATION;  Surgeon: Erline Levine, MD;  Location: [place] NEURO ORS;  Service: Neurosurgery;  Laterality: N/A;  . CARDIAC CATHETERIZATION  2013  . CARDIAC SURGERY     bypass X 2  . COLONOSCOPY WITH PROPOFOL N/A 05/08/2016   Procedure: COLONOSCOPY WITH PROPOFOL;  Surgeon: Jonathon Bellows, MD;  Location: [facility] ENDOSCOPY;  Service: Gastroenterology;  Laterality: N/A;  .  CORONARY ARTERY BYPASS GRAFT  2013   LIMA-LAD, SVG-PDA 03/27/11 (Dr. Francee Gentile)  . CRANIOTOMY N/A 10/15/2015   Procedure: LEFT FRONTAL CRANIOTOMY TUMOR EXCISION with Curve;  Surgeon: Erline Levine, MD;  Location: [place] NEURO ORS;  Service: Neurosurgery;  Laterality: N/A;  CRANIOTOMY TUMOR EXCISION  . EYE SURGERY    . FOOT SURGERY  08/2010  . JOINT REPLACEMENT Left    partial knee  . KNEE ARTHROSCOPY  12/2008  . LYMPH NODE BIOPSY    . PACEMAKER INSERTION    . TONSILLECTOMY      I have reviewed the social history and family history with the patient and they are unchanged from previous note.  ALLERGIES:  is allergic to zocor [simvastatin].  MEDICATIONS:  Current Outpatient Prescriptions  Medication Sig Dispense Refill  . acetaminophen (TYLENOL) 325 MG tablet Take 2 tablets (650 mg total) by mouth every 6 (six) hours as needed for mild pain. (Patient taking differently: Take 650 mg by mouth 2 (two) times daily. )    . carvedilol (COREG) 6.25 MG tablet Take 6.25 mg  by mouth 2 (two) times daily with a meal.    . ferrous sulfate 325 (65 FE) MG tablet Take 1 tablet (325 mg total) by mouth daily with breakfast. 30 tablet 0  . levothyroxine (SYNTHROID, LEVOTHROID) 125 MCG tablet Take 1 tablet (125 mcg total) by mouth daily before breakfast. 39 tablet 9  . lisinopril (PRINIVIL,ZESTRIL) 5 MG tablet Take 1 tablet by mouth daily.    . Multiple Vitamin (MULTIVITAMIN WITH MINERALS) TABS tablet Take 1 tablet by mouth daily.    Marland Kitchen PRESCRIPTION MEDICATION Inhale into the lungs at bedtime. CPAP    . rosuvastatin (CRESTOR) 10 MG tablet Take 10 mg by mouth at bedtime.    . sertraline (ZOLOFT) 100 MG tablet Take 1 tablet by mouth daily.    . sodium polystyrene (KAYEXALATE) 15 GM/60ML suspension Take 60 mLs (15 g total) by mouth once. 60 mL 0  . Turmeric Curcumin 500 MG CAPS Take 1,000 mg by mouth 2 (two) times daily.      No current facility-administered medications for this visit.    Facility-Administered Medications Ordered in Other Visits  Medication Dose Route Frequency Provider Last Rate Last Dose  . 0.9 %  sodium chloride infusion  1,000 mL Intravenous Once [name], MD        PHYSICAL EXAMINATION: ECOG PERFORMANCE STATUS: 1 Pressure 120/80, heart rate 77, temperature 98.4, pulse ox 98% GENERAL:alert, no distress and comfortable SKIN: skin color, texture, turgor are normal, no rashes or significant lesions EYES: normal, Conjunctiva are pink and non-injected, sclera clear OROPHARYNX:no exudate, no erythema and lips, buccal mucosa, and tongue normal  NECK: supple, thyroid normal size, non-tender, without nodularity LYMPH:  no palpable lymphadenopathy in the cervical, [place], axillary or inguinal LUNGS: clear to auscultation and percussion with normal breathing effort HEART: regular rate & rhythm and no murmurs and no lower extremity edema ABDOMEN:abdomen soft, non-tender and normal bowel sounds Musculoskeletal:no cyanosis of digits and no clubbing  NEURO: alert &  oriented x 3 with fluent speech, no focal motor/sensory deficits  LABORATORY DATA:  I have reviewed the data as listed CBC Latest Ref Rng & Units 05/17/2016 05/08/2016 05/07/2016  WBC 4.0 - 10.3 10e3/uL 5.8 - -  Hemoglobin 13.0 - 17.1 g/dL 11.9(L) 10.1(L) 11.3(L)  Hematocrit 38.4 - 49.9 % 35.2(L) - -  Platelets 140 - 400 10e3/uL 218 - -     CMP Latest Ref Rng & Units 05/17/2016  05/07/2016 05/06/2016  Glucose 70 - 140 mg/dl 88 93 -  BUN 7.0 - 26.0 mg/dL 30.6(H) 33(H) -  Creatinine 0.7 - 1.3 mg/dL 1.5(H) 1.45(H) -  Sodium 136 - 145 mEq/L 135(L) 138 -  Potassium 3.5 - 5.1 mEq/L 5.8(H) 4.6 5.6(H)  Chloride 101 - 111 mmol/L - 112(H) -  CO2 22 - 29 mEq/L 22 20(L) -  Calcium 8.4 - 10.4 mg/dL 9.6 7.6(L) -  Total Protein 6.4 - 8.3 g/dL 6.7 - -  Total Bilirubin 0.20 - 1.20 mg/dL 0.52 - -  Alkaline Phos 40 - 150 U/L 53 - -  AST 5 - 34 U/L 21 - -  ALT 0 - 55 U/L 19 - -    PATHOLOGY REPORT: Diagnosis 09/21/2015 Lymph node, needle/core biopsy, hypermetabolic left sub clavicular LN - METASTATIC MALIGNANT MELANOMA. - SEE COMMENT. Microscopic Comment The case was discussed with Dr. Alen Blew on 09/22/15. Additional studies can be performed upon clinician request. (JBK:gt, 09/22/15)  Diagnosis 10/15/2015 Brain, for tumor resection, Left frontal - METASTATIC MELANOMA. Microscopic Comment The tumor is a high grade malignancy with frequent mitotic figures and focal intracytoplasmic pigment. Immunohistochemistry shows strong positivity with S-100, HMB45 and Melan-A. (JDP:ecj 10/18/2015)      RADIOGRAPHIC STUDIES: I have personally reviewed the radiological images as listed and agreed with the findings in the report.  CT Head w wo contrast 04/20/2016 IMPRESSION: 1. Mild patchy enhancement at the left anterior frontal lobe treatment site (series 11, image 39) without mass effect and stable surrounding white matter hypodensity without strong evidence of acute vasogenic edema. Recommend continued CT  surveillance. 2. No new metastatic disease or new intracranial abnormality identified. 3. Probable benign 14 mm left parietal bone lucent lesion, unchanged since May 2017.  PET 03/29/2016 IMPRESSION: 1. Overall there has been an interval response to therapy. The index lesions sided on previous exam it are less hypermetabolic when compared with the previous exam. 2. There is an intramuscular lesion within the right masseter which has increased FDG uptake compared with previous exam. 3. No new sites of disease identified. 4. Aortic atherosclerosis and infrarenal abdominal aortic aneurysm. Recommend followup by ultrasound in 3 years. This recommendation follows ACR consensus guidelines: White Paper of the ACR Incidental Findings Committee II on Vascular Findings. Natasha Mead Coll Radiol 2013; 10:789-794  CT head w wo contrast 01/13/2016 IMPRESSION: 1. Ill-defined enhancement and dural thickening of the left frontal resection cavity may represent posttreatment changes or residual/recurrent neoplasm. Follow-up is recommended. 2. New 6 mm hyperdensity in right cerebellar hemisphere with uncertain enhancement may represent a metastasis or small brain parenchymal hemorrhage. 3. Mild interval dilatation of the lateral and third ventricles and resolution of left-to-right midline shift probably due to resolving left hemisphere edema and ex vacuo dilatation. These results will be called to the ordering clinician or representative by the Radiologist Assistant, and communication documented in the PACS or zVision Dashboard.  PET 12/23/2015 IMPRESSION: Improved PET-CT when compared to prior study. Some lesions have resolved and other lesions are smaller. This would suggest a good response to therapy. No new lesions/disease.  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   1. Metastatic melanoma to lung, node, bone and brain, BRAF V600K mutation (+) -I previously  reviewed his PET/CT scan images with pt and his family members. The scan revealed hypermetabolic left upper lobe lung mass, 2 small right lung nodules, probable  bone metastasis, and a subcutaneous left supraclavicular lesion, which is not palpable  -His repeat his CT had showed a 2.0 cm left frontal hyperdense lesion with enhancement on contrast, in the area of his recent hemorrhagic stroke, highly suspicious for brain metastasis.  -We previously discussed his left supraclavicular node biopsy results, which showed metastatic melanoma.. -His case was discussed in our sarcoma tumor   -I previously recommend first-line treatment with Nivolumab alone, given his recent stroke, low tumor burden. I'll reserve BRAF inhibitor with or without MEK inhibitor as a second line therapy if he progress on immunotherapy. Other options of immunotherapy, especially checkpoint inhibitor with ipilumimab, were also discussed with pt and his daughter. We previously reviewed his restaging PET scan images in person with patient and his daughter, he had excellent partial response, especially the lung lesion and subcutaneous nodules. No other new lesions. -We previously discussed his restaging PET from 03/29/16 and it showed interval response to therapy, however there is an intramuscular lesion in the right masseter which has increased FDG uptake. No other new lesion. He is clinically doing well, I do not think he we need to change his systemic therapy at this point, but I'll obtain a close follow-up PET scan next month to rule out disease progression. -We will continue Nivolumab every 2 weeks, he is tolerating well   2. Hemorrhagic stroke and brain metastasis  -I encouraged him to continue physical therapy and occupational therapy. He has recovered  very well -History post SBRT and surgical resection of his solitary brain  metastasis  -He will continue following with Dr. Vertell Limber and Dr. Tammi Klippel. -I encouraged the patient to do puzzles and mind games to improve his brain function and memory. -He still has trace residual aphasia, was evaluated by speech therapist again.  3. Rectal bleeding on 05/06/2016, presumably from diverticulosis -He was admitted to hospital, underwent a colonoscopy, which showed diffuse diverticulosis and interductal hemorrhoids, no active bleeding  4. Anemia form GI bleeding -His anemia is recovering well, hemoglobin 11.9 today -He will continue oral iron  5. Hyperkalemia and AKI  -He has been having mild hypokalemia and slightly increased creatinine in the past month -I recommend him to change his Lisinopril to amlodipine, will ask his PCP Dr. Asher Muir  -I will give NS 1L today, I encouraged him to drink more water -I called in Kayexalate 15 mg once today, potential side effects discussed with him, he agrees to try  6. Fatigue -his fatigue has much improved after he restarted testosterone injection -I encouraged him to gradually increase his activity and exercise  7. Hypothyroidism -He had baseline hypothyroidism before Nivo, but his TSH level has been fluctuating since he started Noivo -he sees his primary care physician Dr. Asher Muir every 2 weeks, who will adjust his Synthroid as needed   8. HTN -his BP has been well controlled lately -Due to his hyperkalemia and slightly increased creatinine, I recommend him to change lisinopril to amlodipine, we'll check with his PCP  9. ? Depression vs adjustment issue -Due to his wife's dementia, they moved to a new facility over year ago, he is more socially isolated because the movement and his malignancy  -He denies depression, but his [name] is concerned about his adjustment issues, during his process to find his new normal. I'll recommend social worker consultation, he declined for now. -he states he feels better overall lately, has  started going to Gym three times a week, I encouraged him to continue exercise, and be  more active  10. Goal of care discussion  -We previously discussed the incurable nature of his cancer, and the overall poor prognosis, especially if he does not have good response to chemotherapy or progress on chemo -The patient understands the goal of care is palliative. -I recommend DNR/DNI, he will think about it   Plan -We'll proceed with Nivolumab treatment today and continue every 2 weeks with labs. -I called in Kayexalate 15 g for his hyperkalemia today -I recommend him to change lisinopril to amlodipine, due to his hyperkalemia and slightly elevated creatinine, I'll copy of my note to his PCP and I will let Dr. Asher Muir decide and order  -I'll see him back in 2 weeks with a restaging PET scan  All questions were answered. The patient knows to call the clinic with any problems, questions or concerns. No barriers to learning was detected.  I spent 25 minutes counseling the patient face to face. The total time spent in the appointment was 30 minutes and more than 50% was on counseling and review of test results    [name], MD 05/17/16

## 2016-05-18 ENCOUNTER — Telehealth: Payer: Self-pay | Admitting: *Deleted

## 2016-05-18 NOTE — Telephone Encounter (Signed)
I passed this message to pt's daughter Dr. [name].   [name] MD

## 2016-05-18 NOTE — Telephone Encounter (Signed)
Spoke with Dr. Asher Muir and asked if pt's  BP med could be changed from  Lisinopril to Amlodipine due to Hyperkalemia issue - as per Dr. Ernestina Penna instructions.  Dr. Asher Muir stated Lisinopril was prescribed by pt's cardiologist; however, Dr. Asher Muir will be glad to change BP med when pt returns for office visit -  Pt sees his PCP every 2 weeks. Dr. [name]    Phone     [phone].

## 2016-05-22 DIAGNOSIS — F329 Major depressive disorder, single episode, unspecified: Secondary | ICD-10-CM | POA: Diagnosis not present

## 2016-05-22 DIAGNOSIS — R413 Other amnesia: Secondary | ICD-10-CM | POA: Diagnosis not present

## 2016-05-22 DIAGNOSIS — D649 Anemia, unspecified: Secondary | ICD-10-CM | POA: Diagnosis not present

## 2016-05-22 DIAGNOSIS — I10 Essential (primary) hypertension: Secondary | ICD-10-CM | POA: Diagnosis not present

## 2016-05-22 DIAGNOSIS — Z79899 Other long term (current) drug therapy: Secondary | ICD-10-CM | POA: Diagnosis not present

## 2016-05-22 DIAGNOSIS — E039 Hypothyroidism, unspecified: Secondary | ICD-10-CM | POA: Diagnosis not present

## 2016-05-22 DIAGNOSIS — C7989 Secondary malignant neoplasm of other specified sites: Secondary | ICD-10-CM | POA: Diagnosis not present

## 2016-05-22 DIAGNOSIS — E7800 Pure hypercholesterolemia, unspecified: Secondary | ICD-10-CM | POA: Diagnosis not present

## 2016-05-29 ENCOUNTER — Encounter (HOSPITAL_COMMUNITY)
Admission: RE | Admit: 2016-05-29 | Discharge: 2016-05-29 | Disposition: A | Discharge status: Home / Self Care | Payer: Medicare Other | Source: Ambulatory Visit | Attending: Hematology | Admitting: Hematology

## 2016-05-29 DIAGNOSIS — R5383 Other fatigue: Secondary | ICD-10-CM | POA: Diagnosis not present

## 2016-05-29 DIAGNOSIS — C7989 Secondary malignant neoplasm of other specified sites: Secondary | ICD-10-CM | POA: Diagnosis not present

## 2016-05-29 DIAGNOSIS — C439 Malignant melanoma of skin, unspecified: Secondary | ICD-10-CM | POA: Diagnosis not present

## 2016-05-29 DIAGNOSIS — C799 Secondary malignant neoplasm of unspecified site: Secondary | ICD-10-CM

## 2016-05-29 DIAGNOSIS — Z79899 Other long term (current) drug therapy: Secondary | ICD-10-CM | POA: Diagnosis not present

## 2016-05-29 DIAGNOSIS — E291 Testicular hypofunction: Secondary | ICD-10-CM | POA: Diagnosis not present

## 2016-05-29 DIAGNOSIS — F329 Major depressive disorder, single episode, unspecified: Secondary | ICD-10-CM | POA: Diagnosis not present

## 2016-05-29 LAB — GLUCOSE, CAPILLARY: GLUCOSE-CAPILLARY: 88 mg/dL (ref 65–99)

## 2016-05-29 MED ORDER — FLUDEOXYGLUCOSE F - 18 (FDG) INJECTION
9.9500 | Freq: Once | INTRAVENOUS | Status: AC | PRN
  Administered 2016-05-29: 9.95 via INTRAVENOUS

## 2016-05-30 NOTE — Progress Notes (Signed)
[place]  [phone] [phone]  Clinic Follow up Note   Patient Care Team: [name], MD as PCP - General (Internal Medicine) 05/31/2016  Chief complaint: Follow-up metastatic melanoma  Oncology History   Metastatic melanoma [facility])   Staging form: Melanoma of the Skin, AJCC 7th Edition     Clinical stage from 09/21/2015: Stage IV [place], [place], [place]) - Signed by [name], MD on 10/09/2015       Metastatic melanoma ([place])   09/16/2015 Imaging    PET scan showed a hypermetabolic 5.1ZG lingular nodule, and a 62m right upper lobe lung nodule, left subclavicular nodes, and bone metastasis in the proximal right humerus and right femur.       09/21/2015 Initial Diagnosis    Metastatic [place]      09/21/2015 Initial Biopsy    Left subclavicular lymph node biopsy showed prostatic of melanoma      09/21/2015 Miscellaneous    BRAF V600K mutation (+)       10/04/2015 Imaging    CT head with without contrast showed a 2.0 x 1.6 x 1.7 cm superficial left frontal hyperdense lesion, with postcontrast enhancement, lying within the previous hemorrhagic area, concerning for solitary melanoma metastasis      10/06/2015 -  Chemotherapy    Nivolumab 2416mevery 2 weeks       10/13/2015 - 10/13/2015 Radiation Therapy    10/13/2015 Preop SRS treatment: 14 Gy  to the left frontal lesion in 1 fraction      10/15/2015 Surgery    left front craniotomy and tumor excision       10/15/2015 Pathology Results    left frontal brain mass excision showed metastatic melanoma, 1.5X1.5X0.6cm      01/13/2016 Imaging    CT head without and with contrast showed ill-defined enhancement and dural thickening of the left frontal resection cavity may represent a post treatment changes or residual neoplasm. A new 6 mm hypodensity in the right cerebellar hemisphere with uncertainty enhancement may represent a metastasis or small brain parenchymal hemorrhage.       01/27/2016 -  01/27/2016 Radiation Therapy    01/27/16 SRS Treatment:  20 Gy in 1 fraction to a 6 mm right cerebellar brain met       03/29/2016 PET scan    PET 03/29/2016 IMPRESSION: 1. Overall there has been an interval response to therapy. The index lesions sided on previous exam it are less hypermetabolic when compared with the previous exam. 2. There is an intramuscular lesion within the right masseter which has increased FDG uptake compared with previous exam. 3. No new sites of disease identified. 4. Aortic atherosclerosis and infrarenal abdominal aortic aneurysm. Recommend followup by ultrasound in 3 years. This recommendation follows ACR consensus guidelines: White Paper of the ACR Incidental Findings Committee II on Vascular Findings. J Joellyn Ruedadiol 2013; 10:789-794      04/20/2016 Imaging    CT Head w wo contrast 1. Mild patchy enhancement at the left anterior frontal lobe treatment site (series 11, image 39) without mass effect and stable surrounding white matter hypodensity without strong evidence of acute vasogenic edema. Recommend continued CT surveillance. 2. No new metastatic disease or new intracranial abnormality identified. 3. Probable benign 14 mm left parietal bone lucent lesion, unchanged since May 2017.      05/06/2016 - 05/08/2016 Hospital Admission    Pt was admitted for rectal bleeding for one day. Breathing spontaneously resolved, hemoglobin dropped from 15 to 10.5,  did not require blood transfusion. Colonoscopy showed no active bleeding, except diverticulosis and hemorrhoid. He was discharged to home.      05/29/2016 Imaging    PET Image Restaging IMPRESSION: 1. Stable exam with no evidence progression. 2. Photopenia in the LEFT frontal lobe at prior surgical site. 3. Stable mildly hypermetabolic RIGHT paratracheal node. This may represent a thyroid nodule. 4. Stable size of minimally metabolic lingular pulmonary nodule. 5. Stable skeletal lesion of the RIGHT  iliac crest 6. No cutaneous metabolic lesion.       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 He was found to have left frontal hemorrhage 5.7 cm x 4.5 cm with 6 mm left to right shift.During the workup for his stroke, a left upper lung mass was noticed on the CT scan. I was called to evaluate his lung mass.  Patient has aphasia, not able to communicate much. According to his daughter, Dr. [name], who is a OB GYN at [employer], he probably only smoked for very short period time during the college. He did have 3 skin melanoma, which was removed, last one was 2-3 years ago. No other cancer history.  CURRENT THERAPY: Nivolumab 240 mg, every 2 weeks, started on 10/06/2015  INTERVAL HISTORY: Mr. Alverio returns for follow-up and nivolumab therapy. He reports he is doing well. He has stopped taking Lisinopril, and switched to amlodipine. He denies hematochezia, though he reports his stools are much darker than before, likely secondary to oral iron pill.   REVIEW OF SYSTEMS:  Constitutional: Denies fevers, chills or abnormal weight loss, (+) profound fatigue Eyes: Denies blurriness of vision Ears, nose, mouth, throat, and face: Denies mucositis or sore throat Respiratory: Denies cough, dyspnea or wheezes Cardiovascular: Denies palpitation, chest discomfort or lower extremity swelling Gastrointestinal:  Denies nausea, heartburn, hematochezia (+) stool darkening from iron supplements Skin: Denies abnormal skin rashes Lymphatics: Denies new lymphadenopathy or easy bruising Neurological:see HPI  Behavioral/Psych: Mood is stable, no new changes  All other systems were reviewed with the patient and are negative.  MEDICAL HISTORY:  Past Medical History:  Diagnosis Date  . Anginal pain ([place])   . Arthritis    mostly hands  . Benign familial hematuria   . BPH (benign  prostatic hypertrophy)   . Brain cancer ([place])    melanoma with brain met  . Coronary artery disease   . GERD (gastroesophageal reflux disease)   . High cholesterol   . Hypertension   . Hypothyroidism   . Intracerebral hemorrhage ([place])   . Melanoma ([place])   . Metastatic melanoma to lung ([place])   . Mood disorder ([place])   . Myocardial infarction   . Pacemaker    due to syncope, 3rd degree HB (upgrade to [facility]. Jude CRT-P 02/25/13 (Dr. Uvaldo Rising)  . Rectal bleed    due to NSAIDS  . Renal disorder   . Skin cancer    melanoma  . Sleep apnea    uses CPAP  . Stroke [facility])     SURGICAL HISTORY: Past Surgical History:  Procedure Laterality Date  . APPLICATION OF CRANIAL NAVIGATION N/A 10/15/2015   Procedure: APPLICATION OF CRANIAL NAVIGATION;  Surgeon: Erline Levine, MD;  Location: [place] NEURO ORS;  Service: Neurosurgery;  Laterality: N/A;  . CARDIAC CATHETERIZATION  2013  . CARDIAC SURGERY     bypass X 2  . COLONOSCOPY WITH PROPOFOL N/A 05/08/2016  Procedure: COLONOSCOPY WITH PROPOFOL;  Surgeon: Jonathon Bellows, MD;  Location[facility] ENDOSCOPY;  Service: Gastroenterology;  Laterality: N/A;  . CORONARY ARTERY BYPASS GRAFT  2013   LIMA-LAD, SVG-PDA 03/27/11 (Dr. Francee Gentile)  . CRANIOTOMY N/A 10/15/2015   Procedure: LEFT FRONTAL CRANIOTOMY TUMOR EXCISION with Curve;  Surgeon: Erline Levine, MD;  Location: [place] NEURO ORS;  Service: Neurosurgery;  Laterality: N/A;  CRANIOTOMY TUMOR EXCISION  . EYE SURGERY    . FOOT SURGERY  08/2010  . JOINT REPLACEMENT Left    partial knee  . KNEE ARTHROSCOPY  12/2008  . LYMPH NODE BIOPSY    . PACEMAKER INSERTION    . TONSILLECTOMY      I have reviewed the social history and family history with the patient and they are unchanged from previous note.  ALLERGIES:  is allergic to zocor [simvastatin].  MEDICATIONS:  Current Outpatient Prescriptions  Medication Sig Dispense Refill  . acetaminophen (TYLENOL) 325 MG tablet Take 2 tablets (650 mg total) by mouth every 6 (six) hours  as needed for mild pain. (Patient taking differently: Take 650 mg by mouth 2 (two) times daily. )    . amLODipine (NORVASC) 2.5 MG tablet Take 2.5 mg by mouth daily.    . carvedilol (COREG) 6.25 MG tablet Take 6.25 mg by mouth 2 (two) times daily with a meal.    . ferrous sulfate 325 (65 FE) MG tablet Take 1 tablet (325 mg total) by mouth daily with breakfast. 30 tablet 0  . levothyroxine (SYNTHROID, LEVOTHROID) 125 MCG tablet Take 1 tablet (125 mcg total) by mouth daily before breakfast. 39 tablet 9  . Multiple Vitamin (MULTIVITAMIN WITH MINERALS) TABS tablet Take 1 tablet by mouth daily.    Mar[place] Kitchen PRESCRIPTION MEDICATION Inhale into the lungs at bedtime. CPAP    . rosuvastatin (CRESTOR) 10 MG tablet Take 10 mg by mouth at bedtime.    . sertraline (ZOLOFT) 100 MG tablet Take 1 tablet by mouth daily.    Marland Kitchen testosterone cypionate (DEPOTESTOTERONE CYPIONATE) 100 MG/ML injection Inject 100 mg into the muscle every 14 (fourteen) days.      No current facility-administered medications for this visit.    Facility-Administered Medications Ordered in Other Visits  Medication Dose Route Frequency Provider Last Rate Last Dose  . 0.9 %  sodium chloride infusion  1,000 mL Intravenous Once [name], MD        PHYSICAL EXAMINATION: ECOG PERFORMANCE STATUS: 3  Vitals:   05/31/16 1446  BP: 125/86  Pulse: 75  Resp: 18  Temp: 98.2 F (36.8 C)   Filed Weights   05/31/16 1446  Weight: 206 lb 3.2 oz (93.5 kg)    GENERAL:alert, no distress and comfortable SKIN: skin color, texture, turgor are normal, no rashes or significant lesions EYES: normal, Conjunctiva are pink and non-injected, sclera clear OROPHARYNX:no exudate, no erythema and lips, buccal mucosa, and tongue normal  NECK: supple, thyroid normal size, non-tender, without nodularity LYMPH:  no palpable lymphadenopathy in the cervical, [place], axillary or inguinal LUNGS: clear to auscultation and percussion with normal breathing effort HEART: regular  rate & rhythm and no murmurs and no lower extremity edema ABDOMEN:abdomen soft, non-tender and normal bowel sounds Musculoskeletal:no cyanosis of digits and no clubbing  NEURO: alert & oriented x 3 with fluent speech, no focal motor/sensory deficits, he walks with a cane independently, although his gait is slightly wobbly.  LABORATORY DATA:  I have reviewed the data as listed CBC Latest Ref Rng & Units 05/31/2016 05/17/2016 05/08/2016  WBC 4.0 - 10.3 10e3/uL 4.7 5.8 -  Hemoglobin 13.0 - 17.1 g/dL 13.1 11.9(L) 10.1(L)  Hematocrit 38.4 - 49.9 % 39.6 35.2(L) -  Platelets 140 - 400 10e3/uL 161 218 -     CMP Latest Ref Rng & Units 05/31/2016 05/17/2016 05/07/2016  Glucose 70 - 140 mg/dl 143(H) 88 93  BUN 7.0 - 26.0 mg/dL 26.8(H) 30.6(H) 33(H)  Creatinine 0.7 - 1.3 mg/dL 1.4(H) 1.5(H) 1.45(H)  Sodium 136 - 145 mEq/L 139 135(L) 138  Potassium 3.5 - 5.1 mEq/L 4.7 5.8(H) 4.6  Chloride 101 - 111 mmol/L - - 112(H)  CO2 22 - 29 mEq/L 22 22 20(L)  Calcium 8.4 - 10.4 mg/dL 9.5 9.6 7.6(L)  Total Protein 6.4 - 8.3 g/dL 6.5 6.7 -  Total Bilirubin 0.20 - 1.20 mg/dL 0.52 0.52 -  Alkaline Phos 40 - 150 U/L 49 53 -  AST 5 - 34 U/L 18 21 -  ALT 0 - 55 U/L 15 19 -   PATHOLOGY REPORT: Diagnosis 09/21/2015 Lymph node, needle/core biopsy, hypermetabolic left sub clavicular LN - METASTATIC MALIGNANT MELANOMA. - SEE COMMENT.  Diagnosis 10/15/2015 Brain, for tumor resection, Left frontal - METASTATIC MELANOMA.      RADIOGRAPHIC STUDIES: I have personally reviewed the radiological images as listed and agreed with the findings in the report. No results found.   PET Image Restage 05/29/16 IMPRESSION: 1. Stable exam with no evidence progression. 2. Photopenia in the LEFT frontal lobe at prior surgical site. 3. Stable mildly hypermetabolic RIGHT paratracheal node. This may represent a thyroid nodule. 4. Stable size of minimally metabolic lingular pulmonary nodule. 5. Stable skeletal lesion of the RIGHT  iliac crest 6. No cutaneous metabolic lesion.  CT Head W Wo Contrast 04/20/16 IMPRESSION: 1. Mild patchy enhancement at the left anterior frontal lobe treatment site (series 11, image 39) without mass effect and stable surrounding white matter hypodensity without strong evidence of acute vasogenic edema. Recommend continued CT surveillance. 2. No new metastatic disease or new intracranial abnormality identified. 3. Probable benign 14 mm left parietal bone lucent lesion, unchanged since May 2017.  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   1. Metastatic melanoma to lung, node, bone and brain, BRAF V600K mutation (+) -I previously  reviewed his PET/CT scan images with pt and his family members. The scan revealed hypermetabolic left upper lobe lung mass, 2 small right lung nodules, probable bone metastasis, and a subcutaneous left supraclavicular lesion, which is not palpable  -His repeat CT had showed a 2.0 cm left frontal hyperdense lesion with enhancement on contrast, in the area of his recent hemorrhagic stroke, highly suspicious for brain metastasis.  -We previously discussed his left supraclavicular node biopsy results, which showed metastatic melanoma.. -His case was discussed in our sarcoma tumor   -I previously recommend a first-line treatment with Nivolumab alone, given his recent stroke, low tumor burden. I'll reserve BRAF inhibitor with or without MEK inhibitor as a second line therapy if he progress on immunotherapy. Other options of immunotherapy, especially checkpoint inhibitor with ipilumimab, were also discussed with pt and his daughter -He has been tolerating Nivolumab well, no significant side effects except fatigue We'll continue -He is now status post SBRT and surgical resection to his solitary brain metastasis on 10/15/15. -Based on  the recent date reported in ASCO 2017 one month ago (abstract [id number] and [id number]), which showed significant and meaningful  improvement of response rate and PFS with combination of nivo and ipi than nivo alone for metastatic melanoma with brain metastasis. So I recommend him to consider adding ipilimumab, every 3 weeks for 4 cycles. However due to his worsening fatigue, I'll hold on ipilumimab for now. -I reviewed his recent PET scan findings, which showed stable disease in lung, node and bone. His previous hypermetabolic uptake in the right master muscle has resolved, no other new lesions  -Lab results reviewed, adequate for treatment, we'll continue nivolumab. -The patient will continue to take iron supplements for the next few months.  2. Hemorrhagic stroke and brain metastasis  -I encouraged him to continue physical therapy and occupational therapy. He has recovered very well -History post SBRT and surgical resection of his solitary brain metastasis   3. Fatigue, hypothyroidism and hypotestosteronemia -He has baseline hypothyroidism and a hypotestosteronemia -His fatigue has much improved since he restarted testosterone injection by his primary care physician -He will follow-up with his primary care physician Dr. Asher Muir for his Synthroid dose adjustment  -His TSH was slightly elevated several weeks ago, today's results are 5.43. -The patient will continue with light exercise as he is able.  4. Anemia and recent GI bleeding  -his anemia has resolved now -I encouraged him to continue oral iron supplement for a total of 3 months   5. Hyperkalemia -possible related to Lisinopril, which he has stopped -resolved now   6. HTN -he is on amlodipine and carvedilol, follow-up of his primary care physician  7. CKD stage III -His Cr has been slightly elevated -I encouraged him to drink water adequately -avoid NSAIDs  8. Goal of care discussion  -We again discussed the incurable nature of his  metastatic melanoma, and the overall poor prognosis, especially if he does not have good response to chemotherapy or progress on chemo -The patient understands the goal of care is palliative. -I recommend DNR/DNI, he will think about it     Plan -lab and PET scan reviewed  -We'll proceed with Nivolumab treatment today and continue every 2 weeks   -I'll see him back in 4 weeks for follow up.  All questions were answered. The patient knows to call the clinic with any problems, questions or concerns. No barriers to learning was detected.  I spent 30 minutes counseling the patient face to face. The total time spent in the appointment was 35 minutes and more than 50% was on counseling and review of test results  This document serves as a record of services personally performed by [name], MD. It was created on her behalf by Maryla Morrow, a trained medical scribe. The creation of this record is based on the scribe's personal observations and the provider's statements to them. This document has been checked and approved by the attending provider.    [name], MD 05/31/16

## 2016-05-31 ENCOUNTER — Other Ambulatory Visit (HOSPITAL_BASED_OUTPATIENT_CLINIC_OR_DEPARTMENT_OTHER): Payer: Medicare Other

## 2016-05-31 ENCOUNTER — Ambulatory Visit: Payer: Medicare Other

## 2016-05-31 ENCOUNTER — Ambulatory Visit (HOSPITAL_BASED_OUTPATIENT_CLINIC_OR_DEPARTMENT_OTHER): Payer: Medicare Other

## 2016-05-31 ENCOUNTER — Ambulatory Visit (HOSPITAL_BASED_OUTPATIENT_CLINIC_OR_DEPARTMENT_OTHER): Payer: Medicare Other | Admitting: Hematology

## 2016-05-31 ENCOUNTER — Encounter: Payer: Self-pay | Admitting: Hematology

## 2016-05-31 VITALS — BP 125/86 | HR 75 | Temp 98.2°F | Resp 18 | Ht 69.5 in | Wt 206.2 lb

## 2016-05-31 DIAGNOSIS — C770 Secondary and unspecified malignant neoplasm of lymph nodes of head, face and neck: Secondary | ICD-10-CM

## 2016-05-31 DIAGNOSIS — C7800 Secondary malignant neoplasm of unspecified lung: Secondary | ICD-10-CM

## 2016-05-31 DIAGNOSIS — E039 Hypothyroidism, unspecified: Secondary | ICD-10-CM

## 2016-05-31 DIAGNOSIS — N183 Chronic kidney disease, stage 3 (moderate): Secondary | ICD-10-CM | POA: Diagnosis not present

## 2016-05-31 DIAGNOSIS — I619 Nontraumatic intracerebral hemorrhage, unspecified: Secondary | ICD-10-CM

## 2016-05-31 DIAGNOSIS — E875 Hyperkalemia: Secondary | ICD-10-CM | POA: Diagnosis not present

## 2016-05-31 DIAGNOSIS — C7951 Secondary malignant neoplasm of bone: Secondary | ICD-10-CM

## 2016-05-31 DIAGNOSIS — Z5112 Encounter for antineoplastic immunotherapy: Secondary | ICD-10-CM

## 2016-05-31 DIAGNOSIS — C799 Secondary malignant neoplasm of unspecified site: Secondary | ICD-10-CM

## 2016-05-31 DIAGNOSIS — C439 Malignant melanoma of skin, unspecified: Secondary | ICD-10-CM | POA: Diagnosis not present

## 2016-05-31 DIAGNOSIS — C7931 Secondary malignant neoplasm of brain: Secondary | ICD-10-CM

## 2016-05-31 DIAGNOSIS — E291 Testicular hypofunction: Secondary | ICD-10-CM

## 2016-05-31 DIAGNOSIS — Z8673 Personal history of transient ischemic attack (TIA), and cerebral infarction without residual deficits: Secondary | ICD-10-CM

## 2016-05-31 DIAGNOSIS — R5383 Other fatigue: Secondary | ICD-10-CM | POA: Diagnosis not present

## 2016-05-31 DIAGNOSIS — I10 Essential (primary) hypertension: Secondary | ICD-10-CM

## 2016-05-31 LAB — CBC WITH DIFFERENTIAL/PLATELET
BASO%: 0.4 % (ref 0.0–2.0)
Basophils Absolute: 0 10*3/uL (ref 0.0–0.1)
EOS%: 10.2 % — AB (ref 0.0–7.0)
Eosinophils Absolute: 0.5 10*3/uL (ref 0.0–0.5)
HEMATOCRIT: 39.6 % (ref 38.4–49.9)
HEMOGLOBIN: 13.1 g/dL (ref 13.0–17.1)
LYMPH%: 11.5 % — ABNORMAL LOW (ref 14.0–49.0)
MCH: 31.6 pg (ref 27.2–33.4)
MCHC: 33.2 g/dL (ref 32.0–36.0)
MCV: 95.2 fL (ref 79.3–98.0)
MONO#: 0.5 10*3/uL (ref 0.1–0.9)
MONO%: 10.8 % (ref 0.0–14.0)
NEUT%: 67.1 % (ref 39.0–75.0)
NEUTROS ABS: 3.2 10*3/uL (ref 1.5–6.5)
Platelets: 161 10*3/uL (ref 140–400)
RBC: 4.16 10*6/uL — ABNORMAL LOW (ref 4.20–5.82)
RDW: 16.9 % — AB (ref 11.0–14.6)
WBC: 4.7 10*3/uL (ref 4.0–10.3)
lymph#: 0.5 10*3/uL — ABNORMAL LOW (ref 0.9–3.3)

## 2016-05-31 LAB — COMPREHENSIVE METABOLIC PANEL
ALBUMIN: 3.7 g/dL (ref 3.5–5.0)
ALT: 15 U/L (ref 0–55)
AST: 18 U/L (ref 5–34)
Alkaline Phosphatase: 49 U/L (ref 40–150)
Anion Gap: 10 mEq/L (ref 3–11)
BILIRUBIN TOTAL: 0.52 mg/dL (ref 0.20–1.20)
BUN: 26.8 mg/dL — AB (ref 7.0–26.0)
CALCIUM: 9.5 mg/dL (ref 8.4–10.4)
CO2: 22 mEq/L (ref 22–29)
CREATININE: 1.4 mg/dL — AB (ref 0.7–1.3)
Chloride: 108 mEq/L (ref 98–109)
EGFR: 50 mL/min/{1.73_m2} — ABNORMAL LOW (ref >90)
Glucose: 143 mg/dl — ABNORMAL HIGH (ref 70–140)
Potassium: 4.7 mEq/L (ref 3.5–5.1)
Sodium: 139 mEq/L (ref 136–145)
TOTAL PROTEIN: 6.5 g/dL (ref 6.4–8.3)

## 2016-05-31 LAB — TSH: TSH: 5.43 m[IU]/L — AB (ref 0.320–4.118)

## 2016-05-31 MED ORDER — SODIUM CHLORIDE 0.9 % IV SOLN
Freq: Once | INTRAVENOUS | Status: AC
  Administered 2016-05-31: 16:00:00 via INTRAVENOUS

## 2016-05-31 MED ORDER — SODIUM CHLORIDE 0.9 % IV SOLN
240.0000 mg | Freq: Once | INTRAVENOUS | Status: AC
  Administered 2016-05-31: 240 mg via INTRAVENOUS
  Filled 2016-05-31: qty 4

## 2016-05-31 MED ORDER — DENOSUMAB 120 MG/1.7ML ~~LOC~~ SOLN
120.0000 mg | Freq: Once | SUBCUTANEOUS | Status: AC
  Administered 2016-05-31: 120 mg via SUBCUTANEOUS
  Filled 2016-05-31: qty 1.7

## 2016-05-31 NOTE — Patient Instructions (Signed)
[facility] Discharge Instructions for Patients Receiving Chemotherapy  Today you received the following chemotherapy agents Opdivo   To help prevent nausea and vomiting after your treatment, we encourage you to take your nausea medication as directed.    If you develop nausea and vomiting that is not controlled by your nausea medication, call the clinic.   BELOW ARE SYMPTOMS THAT SHOULD BE REPORTED IMMEDIATELY:  *FEVER GREATER THAN 100.5 F  *CHILLS WITH OR WITHOUT FEVER  NAUSEA AND VOMITING THAT IS NOT CONTROLLED WITH YOUR NAUSEA MEDICATION  *UNUSUAL SHORTNESS OF BREATH  *UNUSUAL BRUISING OR BLEEDING  TENDERNESS IN MOUTH AND THROAT WITH OR WITHOUT PRESENCE OF ULCERS  *URINARY PROBLEMS  *BOWEL PROBLEMS  UNUSUAL RASH Items with * indicate a potential emergency and should be followed up as soon as possible.  Feel free to call the clinic you have any questions or concerns. The clinic phone number is [phone].  Please show the [place] at check-in to the Emergency Department and triage nurse.   Dehydration, Adult Dehydration is a condition in which there is not enough fluid or water in the body. This happens when you lose more fluids than you take in. Important organs, such as the kidneys, brain, and heart, cannot function without a proper amount of fluids. Any loss of fluids from the body can lead to dehydration. Dehydration can range from mild to severe. This condition should be treated right away to prevent it from becoming severe. What are the causes? This condition may be caused by:  Vomiting.  Diarrhea.  Excessive sweating, such as from heat exposure or exercise.  Not drinking enough fluid, especially:  When ill.  While doing activity that requires a lot of energy.  Excessive urination.  Fever.  Infection.  Certain medicines, such as medicines that cause the body to lose excess fluid (diuretics).  Inability to access safe  drinking water.  Reduced physical ability to get adequate water and food. What increases the risk? This condition is more likely to develop in people:  Who have a poorly controlled long-term (chronic) illness, such as diabetes, heart disease, or kidney disease.  Who are age 26 or older.  Who are disabled.  Who live in a place with high altitude.  Who play endurance sports. What are the signs or symptoms? Symptoms of mild dehydration may include:  Thirst.  Dry lips.  Slightly dry mouth.  Dry, warm skin.  Dizziness. Symptoms of moderate dehydration may include:  Very dry mouth.  Muscle cramps.  Dark urine. Urine may be the color of tea.  Decreased urine production.  Decreased tear production.  Heartbeat that is irregular or faster than normal (palpitations).  Headache.  Light-headedness, especially when you stand up from a sitting position.  Fainting (syncope). Symptoms of severe dehydration may include:  Changes in skin, such as:  Cold and clammy skin.  Blotchy (mottled) or pale skin.  Skin that does not quickly return to normal after being lightly pinched and released (poor skin turgor).  Changes in body fluids, such as:  Extreme thirst.  No tear production.  Inability to sweat when body temperature is high, such as in hot weather.  Very little urine production.  Changes in vital signs, such as:  Weak pulse.  Pulse that is more than 100 beats a minute when sitting still.  Rapid breathing.  Low blood pressure.  Other changes, such as:  Sunken eyes.  Cold hands and feet.  Confusion.  Lack of  energy (lethargy).  Difficulty waking up from sleep.  Short-term weight loss.  Unconsciousness. How is this diagnosed? This condition is diagnosed based on your symptoms and a physical exam. Blood and urine tests may be done to help confirm the diagnosis. How is this treated? Treatment for this condition depends on the severity. Mild or  moderate dehydration can often be treated at home. Treatment should be started right away. Do not wait until dehydration becomes severe. Severe dehydration is an emergency and it needs to be treated in a hospital. Treatment for mild dehydration may include:  Drinking more fluids.  Replacing salts and minerals in your blood (electrolytes) that you may have lost. Treatment for moderate dehydration may include:  Drinking an oral rehydration solution (ORS). This is a drink that helps you replace fluids and electrolytes (rehydrate). It can be found at pharmacies and retail stores. Treatment for severe dehydration may include:  Receiving fluids through an IV tube.  Receiving an electrolyte solution through a feeding tube that is passed through your nose and into your stomach (nasogastric tube, or NG tube).  Correcting any abnormalities in electrolytes.  Treating the underlying cause of dehydration. Follow these instructions at home:  If directed by your health care provider, drink an ORS:  Make an ORS by following instructions on the package.  Start by drinking small amounts, about  cup (120 mL) every 5-10 minutes.  Slowly increase how much you drink until you have taken the amount recommended by your health care provider.  Drink enough clear fluid to keep your urine clear or pale yellow. If you were told to drink an ORS, finish the ORS first, then start slowly drinking other clear fluids. Drink fluids such as:  Water. Do not drink only water. Doing that can lead to having too little salt (sodium) in the body (hyponatremia).  Ice chips.  Fruit juice that you have added water to (diluted fruit juice).  Low-calorie sports drinks.  Avoid:  Alcohol.  Drinks that contain a lot of sugar. These include high-calorie sports drinks, fruit juice that is not diluted, and soda.  Caffeine.  Foods that are greasy or contain a lot of fat or sugar.  Take over-the-counter and prescription  medicines only as told by your health care provider.  Do not take sodium tablets. This can lead to having too much sodium in the body (hypernatremia).  Eat foods that contain a healthy balance of electrolytes, such as bananas, oranges, potatoes, tomatoes, and spinach.  Keep all follow-up visits as told by your health care provider. This is important. Contact a health care provider if:  You have abdominal pain that:  Gets worse.  Stays in one area (localizes).  You have a rash.  You have a stiff neck.  You are more irritable than usual.  You are sleepier or more difficult to wake up than usual.  You feel weak or dizzy.  You feel very thirsty.  You have urinated only a small amount of very dark urine over 6-8 hours. Get help right away if:  You have symptoms of severe dehydration.  You cannot drink fluids without vomiting.  Your symptoms get worse with treatment.  You have a fever.  You have a severe headache.  You have vomiting or diarrhea that:  Gets worse.  Does not go away.  You have blood or green matter (bile) in your vomit.  You have blood in your stool. This may cause stool to look black and tarry.  You have  not urinated in 6-8 hours.  You faint.  Your heart rate while sitting still is over 100 beats a minute.  You have trouble breathing. This information is not intended to replace advice given to you by your health care provider. Make sure you discuss any questions you have with your health care provider. Document Released: 03/27/2005 Document Revised: 10/22/2015 Document Reviewed: 05/21/2015 Elsevier Interactive Patient Education  2017 [employer].

## 2016-06-08 DIAGNOSIS — L853 Xerosis cutis: Secondary | ICD-10-CM | POA: Diagnosis not present

## 2016-06-08 DIAGNOSIS — Z85828 Personal history of other malignant neoplasm of skin: Secondary | ICD-10-CM | POA: Diagnosis not present

## 2016-06-08 DIAGNOSIS — L570 Actinic keratosis: Secondary | ICD-10-CM | POA: Diagnosis not present

## 2016-06-08 DIAGNOSIS — L218 Other seborrheic dermatitis: Secondary | ICD-10-CM | POA: Diagnosis not present

## 2016-06-08 DIAGNOSIS — L720 Epidermal cyst: Secondary | ICD-10-CM | POA: Diagnosis not present

## 2016-06-08 DIAGNOSIS — L821 Other seborrheic keratosis: Secondary | ICD-10-CM | POA: Diagnosis not present

## 2016-06-08 DIAGNOSIS — D1801 Hemangioma of skin and subcutaneous tissue: Secondary | ICD-10-CM | POA: Diagnosis not present

## 2016-06-08 DIAGNOSIS — L814 Other melanin hyperpigmentation: Secondary | ICD-10-CM | POA: Diagnosis not present

## 2016-06-08 DIAGNOSIS — Z85820 Personal history of malignant melanoma of skin: Secondary | ICD-10-CM | POA: Diagnosis not present

## 2016-06-12 DIAGNOSIS — E291 Testicular hypofunction: Secondary | ICD-10-CM | POA: Diagnosis not present

## 2016-06-12 DIAGNOSIS — Z79899 Other long term (current) drug therapy: Secondary | ICD-10-CM | POA: Diagnosis not present

## 2016-06-12 DIAGNOSIS — R531 Weakness: Secondary | ICD-10-CM | POA: Diagnosis not present

## 2016-06-12 DIAGNOSIS — I10 Essential (primary) hypertension: Secondary | ICD-10-CM | POA: Diagnosis not present

## 2016-06-12 DIAGNOSIS — E039 Hypothyroidism, unspecified: Secondary | ICD-10-CM | POA: Diagnosis not present

## 2016-06-12 DIAGNOSIS — C7989 Secondary malignant neoplasm of other specified sites: Secondary | ICD-10-CM | POA: Diagnosis not present

## 2016-06-12 DIAGNOSIS — F329 Major depressive disorder, single episode, unspecified: Secondary | ICD-10-CM | POA: Diagnosis not present

## 2016-06-13 ENCOUNTER — Telehealth: Payer: Self-pay | Admitting: Hematology

## 2016-06-13 NOTE — Telephone Encounter (Signed)
Confirmed 3/8 appointments with patient

## 2016-06-15 ENCOUNTER — Ambulatory Visit (HOSPITAL_BASED_OUTPATIENT_CLINIC_OR_DEPARTMENT_OTHER): Payer: Medicare Other

## 2016-06-15 ENCOUNTER — Other Ambulatory Visit (HOSPITAL_BASED_OUTPATIENT_CLINIC_OR_DEPARTMENT_OTHER): Payer: Medicare Other

## 2016-06-15 ENCOUNTER — Other Ambulatory Visit: Payer: Self-pay | Admitting: Hematology

## 2016-06-15 VITALS — BP 133/70 | HR 74 | Temp 98.0°F | Resp 20

## 2016-06-15 DIAGNOSIS — C7800 Secondary malignant neoplasm of unspecified lung: Secondary | ICD-10-CM | POA: Diagnosis not present

## 2016-06-15 DIAGNOSIS — C799 Secondary malignant neoplasm of unspecified site: Secondary | ICD-10-CM

## 2016-06-15 DIAGNOSIS — C7931 Secondary malignant neoplasm of brain: Secondary | ICD-10-CM

## 2016-06-15 DIAGNOSIS — C439 Malignant melanoma of skin, unspecified: Secondary | ICD-10-CM | POA: Diagnosis not present

## 2016-06-15 DIAGNOSIS — C770 Secondary and unspecified malignant neoplasm of lymph nodes of head, face and neck: Secondary | ICD-10-CM

## 2016-06-15 DIAGNOSIS — Z5112 Encounter for antineoplastic immunotherapy: Secondary | ICD-10-CM

## 2016-06-15 DIAGNOSIS — C7951 Secondary malignant neoplasm of bone: Secondary | ICD-10-CM | POA: Diagnosis not present

## 2016-06-15 DIAGNOSIS — Z79899 Other long term (current) drug therapy: Secondary | ICD-10-CM | POA: Diagnosis not present

## 2016-06-15 LAB — COMPREHENSIVE METABOLIC PANEL
ALK PHOS: 51 U/L (ref 40–150)
ALT: 14 U/L (ref 0–55)
ANION GAP: 8 meq/L (ref 3–11)
AST: 19 U/L (ref 5–34)
Albumin: 3.9 g/dL (ref 3.5–5.0)
BILIRUBIN TOTAL: 0.58 mg/dL (ref 0.20–1.20)
BUN: 28.8 mg/dL — ABNORMAL HIGH (ref 7.0–26.0)
CO2: 28 mEq/L (ref 22–29)
Calcium: 10.1 mg/dL (ref 8.4–10.4)
Chloride: 104 mEq/L (ref 98–109)
Creatinine: 1.4 mg/dL — ABNORMAL HIGH (ref 0.7–1.3)
EGFR: 49 mL/min/{1.73_m2} — AB (ref >90)
Glucose: 87 mg/dl (ref 70–140)
Potassium: 5.1 mEq/L (ref 3.5–5.1)
Sodium: 139 mEq/L (ref 136–145)
TOTAL PROTEIN: 6.7 g/dL (ref 6.4–8.3)

## 2016-06-15 LAB — CBC WITH DIFFERENTIAL/PLATELET
BASO%: 0.2 % (ref 0.0–2.0)
BASOS ABS: 0 10*3/uL (ref 0.0–0.1)
EOS ABS: 0.3 10*3/uL (ref 0.0–0.5)
EOS%: 5.5 % (ref 0.0–7.0)
HCT: 42.6 % (ref 38.4–49.9)
HEMOGLOBIN: 14.6 g/dL (ref 13.0–17.1)
LYMPH%: 10.2 % — AB (ref 14.0–49.0)
MCH: 32.3 pg (ref 27.2–33.4)
MCHC: 34.3 g/dL (ref 32.0–36.0)
MCV: 94.2 fL (ref 79.3–98.0)
MONO#: 0.5 10*3/uL (ref 0.1–0.9)
MONO%: 11 % (ref 0.0–14.0)
NEUT%: 73.1 % (ref 39.0–75.0)
NEUTROS ABS: 3.4 10*3/uL (ref 1.5–6.5)
PLATELETS: 168 10*3/uL (ref 140–400)
RBC: 4.52 10*6/uL (ref 4.20–5.82)
RDW: 14.5 % (ref 11.0–14.6)
WBC: 4.7 10*3/uL (ref 4.0–10.3)
lymph#: 0.5 10*3/uL — ABNORMAL LOW (ref 0.9–3.3)

## 2016-06-15 LAB — TSH: TSH: 9.568 m(IU)/L — ABNORMAL HIGH (ref 0.320–4.118)

## 2016-06-15 MED ORDER — SODIUM CHLORIDE 0.9 % IV SOLN
240.0000 mg | Freq: Once | INTRAVENOUS | Status: AC
  Administered 2016-06-15: 240 mg via INTRAVENOUS
  Filled 2016-06-15: qty 20

## 2016-06-15 MED ORDER — SODIUM CHLORIDE 0.9 % IV SOLN
Freq: Once | INTRAVENOUS | Status: AC
  Administered 2016-06-15: 13:00:00 via INTRAVENOUS

## 2016-06-15 NOTE — Patient Instructions (Signed)
[facility] Discharge Instructions for Patients Receiving Chemotherapy  Today you received the following chemotherapy agents Opdivo.  To help prevent nausea and vomiting after your treatment, we encourage you to take your nausea medication as directed.   If you develop nausea and vomiting that is not controlled by your nausea medication, call the clinic.   BELOW ARE SYMPTOMS THAT SHOULD BE REPORTED IMMEDIATELY:  *FEVER GREATER THAN 100.5 F  *CHILLS WITH OR WITHOUT FEVER  NAUSEA AND VOMITING THAT IS NOT CONTROLLED WITH YOUR NAUSEA MEDICATION  *UNUSUAL SHORTNESS OF BREATH  *UNUSUAL BRUISING OR BLEEDING  TENDERNESS IN MOUTH AND THROAT WITH OR WITHOUT PRESENCE OF ULCERS  *URINARY PROBLEMS  *BOWEL PROBLEMS  UNUSUAL RASH Items with * indicate a potential emergency and should be followed up as soon as possible.  Feel free to call the clinic you have any questions or concerns. The clinic phone number is [phone].  Please show the CHEMO ALERT CARD at check-in to the Emergency Department and triage nurse.    

## 2016-06-21 NOTE — Progress Notes (Signed)
[name]  Telephone:([phone]) [phone] [phone]) [phone]  Clinic Follow up Note   Patient Care Team: David Poot, MD as PCP - General (Internal Medicine) 06/28/2016  Chief complaint: Follow-up metastatic melanoma  Oncology History   Metastatic melanoma [facility])   Staging form: Melanoma of the Skin, AJCC 7th Edition     Clinical stage from 09/21/2015: Stage IV [place], [place], [place]) - Signed by David Merle, MD on 10/09/2015       Metastatic melanoma ([place])   09/16/2015 Imaging    PET scan showed a hypermetabolic 1.8EX lingular nodule, and a 66m right upper lobe lung nodule, left subclavicular nodes, and bone metastasis in the proximal right humerus and right femur.       09/21/2015 Initial Diagnosis    Metastatic melanoma ([place]      09/21/2015 Initial Biopsy    Left subclavicular lymph node biopsy showed prostatic of melanoma      09/21/2015 Miscellaneous    BRAF V600K mutation (+)       10/04/2015 Imaging    CT head with without contrast showed a 2.0 x 1.6 x 1.7 cm superficial left frontal hyperdense lesion, with postcontrast enhancement, lying within the previous hemorrhagic area, concerning for solitary melanoma metastasis      10/06/2015 -  Chemotherapy    Nivolumab [id number] 2 weeks       10/13/2015 - 10/13/2015 Radiation Therapy    10/13/2015 Preop SRS treatment: 14 Gy  to the left frontal lesion in 1 fraction      10/15/2015 Surgery    left front craniotomy and tumor excision       10/15/2015 Pathology Results    left frontal brain mass excision showed metastatic melanoma, 1.5X1.5X0.6cm      01/13/2016 Imaging    CT head without and with contrast showed ill-defined enhancement and dural thickening of the left frontal resection cavity may represent a post treatment changes or residual neoplasm. A new 6 mm hypodensity in the right cerebellar hemisphere with uncertainty enhancement may represent a metastasis or small brain parenchymal hemorrhage.       01/27/2016 -  01/27/2016 Radiation Therapy    01/27/16 SRS Treatment:  20 Gy in 1 fraction to a 6 mm right cerebellar brain met       03/29/2016 PET scan    PET 03/29/2016 IMPRESSION: 1. Overall there has been an interval response to therapy. The index lesions sided on previous exam it are less hypermetabolic when compared with the previous exam. 2. There is an intramuscular lesion within the right masseter which has increased FDG uptake compared with previous exam. 3. No new sites of disease identified. 4. Aortic atherosclerosis and infrarenal abdominal aortic aneurysm. Recommend followup by ultrasound in 3 years. This recommendation follows ACR consensus guidelines: White Paper of the ACR Incidental Findings Committee II on Vascular Findings. J David [name] 2013; 10:789-794      04/20/2016 Imaging    CT Head w wo contrast 1. Mild patchy enhancement at the left anterior frontal lobe treatment site (series 11, image 39) without mass effect and stable surrounding white matter hypodensity without strong evidence of acute vasogenic edema. Recommend continued CT surveillance. 2. No new metastatic disease or new intracranial abnormality identified. 3. Probable benign 14 mm left parietal bone lucent lesion, unchanged since May 2017.      05/06/2016 - 05/08/2016 Hospital Admission    Pt was admitted for rectal bleeding for one day. Breathing spontaneously resolved, hemoglobin dropped from 15 to 10.5,  did not require blood transfusion. Colonoscopy showed no active bleeding, except diverticulosis and hemorrhoid. He was discharged to home.      05/29/2016 Imaging    PET Image Restaging IMPRESSION: 1. Stable exam with no evidence progression. 2. Photopenia in the LEFT frontal lobe at prior surgical site. 3. Stable mildly hypermetabolic RIGHT paratracheal node. This may represent a thyroid nodule. 4. Stable size of minimally metabolic lingular pulmonary nodule. 5. Stable skeletal lesion of the RIGHT  iliac crest 6. No cutaneous metabolic lesion.       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 He was found to have left frontal hemorrhage 5.7 cm x 4.5 cm with 6 mm left to right shift.During the workup for his stroke, a left upper lung mass was noticed on the CT scan. I was called to evaluate his lung mass.  Patient has aphasia, not able to communicate much. According to his daughter, Dr. Gala [name], who is a OB GYN at [employer], he probably only smoked for very short period time during the college. He did have 3 skin melanoma, which was removed, last one was 2-3 years ago. No other cancer history.  CURRENT THERAPY: Nivolumab 240 mg, every 2 weeks, started on 10/06/2015  INTERVAL HISTORY: [name] returns for follow-up and nivolumab therapy. He reports he is doing well. He reports being taken off Zoloft to another medication, but was unable to play the piano or function and was switched back to Zoloft. He was on 100 mg of Zoloft and is now on 50 mg. He has mild constipation from taking an iron pill. He reports drinking adequate fluids and urinating once an hour. The patient is still fatigued. He denies any reactions after his infusions.  REVIEW OF SYSTEMS:  Constitutional: Denies fevers, chills or abnormal weight loss, (+) fatigue Eyes: Denies blurriness of vision Ears, nose, mouth, throat, and face: Denies mucositis or sore throat Respiratory: Denies cough, dyspnea or wheezes Cardiovascular: Denies palpitation, chest discomfort or lower extremity swelling Gastrointestinal:  Denies nausea, heartburn, hematochezia (+) mild constipation from iron pill. Skin: Denies abnormal skin rashes Lymphatics: Denies new lymphadenopathy or easy bruising Neurological:see HPI  Behavioral/Psych: Mood is stable, no new changes  All other systems were reviewed with the patient  and are negative.  MEDICAL HISTORY:  Past Medical History:  Diagnosis Date  . Anginal pain ([place])   . Arthritis    mostly hands  . Benign familial hematuria   . BPH (benign prostatic hypertrophy)   . Brain cancer ([place])    melanoma with brain met  . Coronary artery disease   . GERD (gastroesophageal reflux disease)   . High cholesterol   . Hypertension   . Hypothyroidism   . Intracerebral hemorrhage ([place])   . Melanoma ([place])   . Metastatic melanoma to lung ([place])   . Mood disorder ([place])   . Myocardial infarction   . Pacemaker    due to syncope, 3rd degree HB (upgrade to [facility]. Jude CRT-P 02/25/13 (Dr. Uvaldo Rising)  . Rectal bleed    due to NSAIDS  . Renal disorder   . Skin cancer    melanoma  . Sleep apnea    uses CPAP  . Stroke [facility])     SURGICAL HISTORY: Past Surgical History:  Procedure Laterality Date  . APPLICATION OF CRANIAL NAVIGATION N/A 10/15/2015   Procedure: APPLICATION OF CRANIAL NAVIGATION;  Surgeon:  Erline Levine, MD;  Location: [facility] NEURO ORS;  Service: Neurosurgery;  Laterality: N/A;  . CARDIAC CATHETERIZATION  2013  . CARDIAC SURGERY     bypass X 2  . COLONOSCOPY WITH PROPOFOL N/A 05/08/2016   Procedure: COLONOSCOPY WITH PROPOFOL;  Surgeon: Jonathon Bellows, MD;  Location: [facility] ENDOSCOPY;  Service: Gastroenterology;  Laterality: N/A;  . CORONARY ARTERY BYPASS GRAFT  2013   LIMA-LAD, SVG-PDA 03/27/11 (Dr. Francee Gentile)  . CRANIOTOMY N/A 10/15/2015   Procedure: LEFT FRONTAL CRANIOTOMY TUMOR EXCISION with Curve;  Surgeon: Erline Levine, MD;  Location: [facility] NEURO ORS;  Service: Neurosurgery;  Laterality: N/A;  CRANIOTOMY TUMOR EXCISION  . EYE SURGERY    . FOOT SURGERY  08/2010  . JOINT REPLACEMENT Left    partial knee  . KNEE ARTHROSCOPY  12/2008  . LYMPH NODE BIOPSY    . PACEMAKER INSERTION    . TONSILLECTOMY      I have reviewed the social history and family history with the patient and they are unchanged from previous note.  ALLERGIES:  is allergic to zocor  [simvastatin].  MEDICATIONS:  Current Outpatient Prescriptions  Medication Sig Dispense Refill  . acetaminophen (TYLENOL) 325 MG tablet Take 2 tablets (650 mg total) by mouth every 6 (six) hours as needed for mild pain. (Patient taking differently: Take 650 mg by mouth 2 (two) times daily. )    . amLODipine (NORVASC) 2.5 MG tablet Take 2.5 mg by mouth daily.    . ASPIRIN LOW DOSE 81 MG EC tablet TK 1 T PO QD  11  . carvedilol (COREG) 6.25 MG tablet Take 6.25 mg by mouth 2 (two) times daily with a meal.    . ferrous sulfate 325 (65 FE) MG tablet Take 1 tablet (325 mg total) by mouth daily with breakfast. 30 tablet 0  . levothyroxine (SYNTHROID, LEVOTHROID) 125 MCG tablet Take 1 tablet (125 mcg total) by mouth daily before breakfast. 39 tablet 9  . Multiple Vitamin (MULTIVITAMIN WITH MINERALS) TABS tablet Take 1 tablet by mouth daily.    Marland Kitchen PRESCRIPTION MEDICATION Inhale into the lungs at bedtime. CPAP    . rosuvastatin (CRESTOR) 10 MG tablet Take 10 mg by mouth at bedtime.    . sertraline (ZOLOFT) 50 MG tablet     . testosterone cypionate (DEPOTESTOTERONE CYPIONATE) 100 MG/ML injection Inject 100 mg into the muscle every 14 (fourteen) days.      No current facility-administered medications for this visit.    Facility-Administered Medications Ordered in Other Visits  Medication Dose Route Frequency Provider Last Rate Last Dose  . 0.9 %  sodium chloride infusion  1,000 mL Intravenous Once David Merle, MD        PHYSICAL EXAMINATION: ECOG PERFORMANCE STATUS: 3  Vitals:   06/28/16 1138  BP: (!) 142/98  Pulse: 75  Resp: 18  Temp: 98.3 F (36.8 C)   Filed Weights   06/28/16 1138  Weight: 205 lb 1.6 oz (93 kg)    GENERAL:alert, no distress and comfortable SKIN: skin color, texture, turgor are normal, no rashes or significant lesions EYES: normal, Conjunctiva are pink and non-injected, sclera clear OROPHARYNX:no exudate, no erythema and lips, buccal mucosa, and tongue normal  NECK:  supple, thyroid normal size, non-tender, without nodularity LYMPH:  no palpable lymphadenopathy in the cervical, [place], axillary or inguinal LUNGS: clear to auscultation and percussion with normal breathing effort HEART: regular rate & rhythm and no murmurs and no lower extremity edema ABDOMEN:abdomen soft, non-tender and normal bowel sounds Musculoskeletal:no cyanosis  of digits and no clubbing  NEURO: alert & oriented x 3 with fluent speech, no focal motor/sensory deficits, he walks independently, gait is normal   LABORATORY DATA:  I have reviewed the data as listed CBC Latest Ref Rng & Units 06/28/2016 06/15/2016 05/31/2016  WBC 4.0 - 10.3 10e3/uL 4.3 4.7 4.7  Hemoglobin 13.0 - 17.1 g/dL 15.2 14.6 13.1  Hematocrit 38.4 - 49.9 % 44.4 42.6 39.6  Platelets 140 - 400 10e3/uL 177 168 161     CMP Latest Ref Rng & Units 06/28/2016 06/15/2016 05/31/2016  Glucose 70 - 140 mg/dl 95 87 143(H)  BUN 7.0 - 26.0 mg/dL 28.8(H) 28.8(H) 26.8(H)  Creatinine 0.7 - 1.3 mg/dL 1.4(H) 1.4(H) 1.4(H)  Sodium 136 - 145 mEq/L 140 139 139  Potassium 3.5 - 5.1 mEq/L 4.6 5.1 4.7  Chloride 101 - 111 mmol/L - - -  CO2 22 - 29 mEq/L 23 28 22   Calcium 8.4 - 10.4 mg/dL 9.7 10.1 9.5  Total Protein 6.4 - 8.3 g/dL 6.7 6.7 6.5  Total Bilirubin 0.20 - 1.20 mg/dL 0.44 0.58 0.52  Alkaline Phos 40 - 150 U/L 54 51 49  AST 5 - 34 U/L 23 19 18   ALT 0 - 55 U/L 23 14 15    PATHOLOGY REPORT: Diagnosis 09/21/2015 Lymph node, needle/core biopsy, hypermetabolic left sub clavicular LN - METASTATIC MALIGNANT MELANOMA. - SEE COMMENT.  Diagnosis 10/15/2015 Brain, for tumor resection, Left frontal - METASTATIC MELANOMA.      RADIOGRAPHIC STUDIES: I have personally reviewed the radiological images as listed and agreed with the findings in the report. No results found.   PET Image Restage 05/29/16 IMPRESSION: 1. Stable exam with no evidence progression. 2. Photopenia in the LEFT frontal lobe at prior surgical site. 3. Stable mildly  hypermetabolic RIGHT paratracheal node. This may represent a thyroid nodule. 4. Stable size of minimally metabolic lingular pulmonary nodule. 5. Stable skeletal lesion of the RIGHT iliac crest 6. No cutaneous metabolic lesion.   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   1. Metastatic melanoma to lung, node, bone and brain, BRAF V600K mutation (+) -I previously  reviewed his PET/CT scan images with pt and his family members. The scan revealed hypermetabolic left upper lobe lung mass, 2 small right lung nodules, probable bone metastasis, and a subcutaneous left supraclavicular lesion, which is not palpable  -His repeat CT had showed a 2.0 cm left frontal hyperdense lesion with enhancement on contrast, in the area of his recent hemorrhagic stroke, highly suspicious for brain metastasis.  -We previously discussed his left supraclavicular node biopsy results, which showed metastatic melanoma.. -His case was discussed in our sarcoma tumor board. -I previously recommend a first-line treatment with Nivolumab alone, given his recent stroke, low tumor burden. I'll reserve BRAF inhibitor with or without MEK inhibitor as a second line therapy if he progress on immunotherapy. Other options of immunotherapy, especially checkpoint inhibitor with ipilumimab, were also discussed with pt and his daughter -He has been tolerating Nivolumab well, no significant side effects except fatigue We'll continue -He is now status post SRS and surgical resection to his solitary brain metastasis on 10/15/15. -I reviewed his recent PET scan findings from 05/29/16, which showed stable disease in lung, node and bone. His previous hypermetabolic uptake in the right master muscle has resolved, no other new lesions  -Lab results reviewed, adequate for treatment, we'll continue nivolumab. -  We  discussed the FDA has recently approved that Nivolumab could be given every 4 weeks, but at a higher dose. However, insurance has not approved this yet. Once insurance approves this treatment option, then he could consider this. -plan to repeat PET in May  -He is clinically doing very well, tolerating Nivolumab without any significant side effects. -We'll continue Xgeva monthly for his bone metastasis.   2. Hemorrhagic stroke and brain metastasis  -I encouraged him to continue physical therapy and occupational therapy. He has recovered very well -History post SRS and surgical resection of his solitary brain metastasis  -His neurology deficit has much improved  3. Fatigue, hypothyroidism and hypotestosteronemia -He has baseline hypothyroidism and hypotestosteronemia -His fatigue has much improved since he restarted testosterone injection by his primary care physician -He will follow-up with his primary care physician Dr. Asher Muir for his Synthroid dose adjustment  -His TSH was slightly In November 2017. TSH 9.6 on 06/15/16. -The patient will continue with light exercise as he is able.  4. Anemia and recent GI bleeding  -his anemia has resolved now -I encouraged him to continue oral iron supplement for a total of 3 months   5. HTN -he is on amlodipine and carvedilol, follow-up of his primary care physician  6. Depression  -he is on zoloft 69m daily, management by his PCP  -improved overall   7. CKD stage III -His Cr has been slightly elevated -I encouraged him to drink water adequately -avoid NSAIDs  8. Goal of care discussion  -We again discussed the incurable nature of his metastatic melanoma, and the overall poor prognosis, especially if he does not have good response to chemotherapy or progress on chemo -The patient understands the goal of care is palliative. -I recommend DNR/DNI, he will think about it   Plan -labs reviewed. -We'll proceed with Nivolumab treatment today and  continue every 2 weeks, 30 mins infusion   -Xgeva injection today and continue every 4 weeks  -I'll see him back in 4 weeks for follow up. Will order restaging PET on next visit   All questions were answered. The patient knows to call the clinic with any problems, questions or concerns. No barriers to learning was detected.  I spent 30 minutes counseling the patient face to face. The total time spent in the appointment was 35 minutes and more than 50% was on counseling and review of test results    FTruitt Merle MD 06/28/16   This document serves as a record of services personally performed by YTruitt Merle MD. It was created on her behalf by JDarcus Austin a trained medical scribe. The creation of this record is based on the scribe's personal observations and the provider's statements to them. This document has been checked and approved by the attending provider.

## 2016-06-26 ENCOUNTER — Ambulatory Visit: Payer: Medicare Other | Admitting: Neurology

## 2016-06-26 DIAGNOSIS — E291 Testicular hypofunction: Secondary | ICD-10-CM | POA: Diagnosis not present

## 2016-06-27 ENCOUNTER — Encounter: Payer: Self-pay | Admitting: Neurology

## 2016-06-27 ENCOUNTER — Ambulatory Visit (INDEPENDENT_AMBULATORY_CARE_PROVIDER_SITE_OTHER): Payer: Medicare Other | Admitting: Neurology

## 2016-06-27 VITALS — BP 132/93 | HR 81 | Wt 206.0 lb

## 2016-06-27 DIAGNOSIS — E785 Hyperlipidemia, unspecified: Secondary | ICD-10-CM | POA: Diagnosis not present

## 2016-06-27 DIAGNOSIS — C799 Secondary malignant neoplasm of unspecified site: Secondary | ICD-10-CM | POA: Diagnosis not present

## 2016-06-27 DIAGNOSIS — C7931 Secondary malignant neoplasm of brain: Secondary | ICD-10-CM

## 2016-06-27 DIAGNOSIS — I10 Essential (primary) hypertension: Secondary | ICD-10-CM | POA: Diagnosis not present

## 2016-06-27 DIAGNOSIS — I611 Nontraumatic intracerebral hemorrhage in hemisphere, cortical: Secondary | ICD-10-CM | POA: Insufficient documentation

## 2016-06-27 DIAGNOSIS — Z950 Presence of cardiac pacemaker: Secondary | ICD-10-CM

## 2016-06-27 DIAGNOSIS — C439 Malignant melanoma of skin, unspecified: Secondary | ICD-10-CM

## 2016-06-27 NOTE — Progress Notes (Signed)
STROKE NEUROLOGY FOLLOW UP NOTE  NAME: Marlo Goodrich DOB: 1941-01-28  REASON FOR VISIT: stroke follow up HISTORY FROM: pt and chart  Today we had the pleasure of seeing [name] in follow-up at our Neurology Clinic. Pt was accompanied by no one.   History Summary Ramondo Dietze is an 76 y.o. male history of hypertension, hypercholesterolemia, sleep apnea, and pacemaker placement for AV node disorder admitted on 08/22/15 for aphasia. His BP was 176/111 on presentation. Cardene drip was started for management of hypertensive urgency. CT showed left frontal large ICH. He was admitted to ICU. CTA head showed no vascular pathology. CUS and TTE unremarkable. Repeat CT head stable. LDL 135 and A1C 6.1. ICH etiology unclear, concerning for HTN or CAA. Recommend repeat CT in 3-4 weeks. He was discharged to CIR on 08/26/15 with crestor.   Chest CT on 08/23/15 showed left LL mass but due to high Cre, CT chest was done later in CIR showed LUL malignancy. Recommended PET which done on 09/16/15 confirmed LUL tumor but also showed multiple metastasis in the body. 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 Neurosurgery involved and pt had repeat CT on 10/13/15 showed growing left frontal lesion. Left frontal debulk surgery done on 10/16/15. Pt also underwent chemotherapy for melanoma metastasis and repeat PET on 12/23/15 showed excellent response to chemo.   12/28/15 follow up - the patient has been doing well. He had dramatic recovery. Basically, no neuro residue deficit. He felt good only sometimes had word finding difficulty with stress. No HA. Has followed with dermatology for skin lesion resection. On crestor for HLD without side effect. BP 125/82.   Interval History During the interval time, pt has been doing well. Repeat CT head in 04/2016 showed no new lesion and PET in 05/2016 showed responding well to chemo. Pt feels great although occasionally still has word  finding difficulty but largely doing well. He was put back on ASA '81mg'$  and crestor by his cardiologist for cardiac prevention. He is still on chemo therapy with Dr. Burr Medico and radiation with Dr. Tammi Klippel. He follows with Dr. Vertell Limber also. Bp today 132/93.  REVIEW OF SYSTEMS: Full 14 system review of systems performed and notable only for those listed below and in HPI above, all others are negative:  Constitutional:   Cardiovascular:  Ear/Nose/Throat:  Runny nose Skin:  Eyes:   Respiratory:   Gastroitestinal:  Rectal bleeding Genitourinary:  Hematology/Lymphatic:   Endocrine:  Musculoskeletal:  Joint pain Allergy/Immunology:   Neurological:  Memory loss Psychiatric:  Sleep:   The following represents the patient's updated allergies and side effects list: Allergies  Allergen Reactions  . Zocor [Simvastatin] Other (See Comments)    Fever - temp of 103.   In 2003    The neurologically relevant items on the patient's problem list were reviewed on today's visit.  Neurologic Examination  A problem focused neurological exam (12 or more points of the single system neurologic examination, vital signs counts as 1 point, cranial nerves count for 8 points) was performed.  Blood pressure (!) 132/93, pulse 81, weight 206 lb (93.4 kg).  General - Well nourished, well developed, in no apparent distress.  Ophthalmologic - Fundi not visualized due to small pupils.  Cardiovascular - Regular rate and rhythm with no murmur.  Mental Status -  Level of arousal and orientation to time, place, and person were intact. Language including expression, naming, repetition, comprehension was assessed and found intact. Fund of [employer]  was assessed and was impaired.  Cranial Nerves II - XII - II - Visual field intact OU. III, IV, VI - Extraocular movements intact. V - Facial sensation intact bilaterally. VII - Facial movement intact bilaterally. VIII - Hearing & vestibular intact bilaterally. X - Palate  elevates symmetrically. XI - Chin turning & shoulder shrug intact bilaterally. XII - Tongue protrusion intact.  Motor Strength - The patient's strength was normal in all extremities and pronator drift was absent.  Bulk was normal and fasciculations were absent.   Motor Tone - Muscle tone was assessed at the neck and appendages and was normal.  Reflexes - The patient's reflexes were 1+ in all extremities and he had no pathological reflexes.  Sensory - Light touch, temperature/pinprick were assessed and were normal.    Coordination - The patient had normal movements in the hands with no ataxia or dysmetria.  Tremor was absent.  Gait and Station - mild stooped posturing   Data reviewed: I personally reviewed the images and agree with the radiology interpretations.  Ct Head Wo Contrast 08/22/2015  Large left frontal lobe intraparenchymal acute hemorrhage with associated mass effect and approximately 6 mm left-to-right midline shift.   Chest Port 1 View 08/23/2015  Apparent left lower lobe mass measuring 3.8 x 3.0 cm. Advise correlation with chest CT, ideally with intravenous contrast, to further evaluate. Lungs elsewhere clear. Heart mildly enlarged with pacemaker leads as described. Patient is also status post coronary artery bypass grafting.   Ct Angio Head W/cm &/or Wo Cm 08/23/2015 Large left frontal hematoma unchanged from yesterday. No underlying vascular malformation. Consider amyloid and hypertension as causes of the hemorrhage.  CT Head 08/26/2015 Left frontal hematoma shows no interval enlargement. Similar local mass effect and vasogenic edema.  Carotid ultrasound There is no obvious evidence of hemodynamically significant internal carotid artery stenosis bilaterally. Vertebral arteries are patent with antegrade flow.  2D echocardiogram - Left ventricle: The cavity size was normal. There was moderate concentric hypertrophy. Systolic function was vigorous. The  estimated ejection fraction was in the range of 65% to 70%. Wall motion was normal; there were no regional wall motion abnormalities. Doppler parameters are consistent with abnormal left ventricular relaxation (grade 1 diastolic dysfunction). There was no evidence of elevated ventricular filling pressure by Doppler parameters. - Aortic valve: There was no regurgitation. - Aortic root: The aortic root was normal in size. - Ascending aorta: The ascending aorta was normal in size. - Mitral valve: There was no regurgitation. - Left atrium: The atrium was normal in size. - Right ventricle: The cavity size was normal. Wall thickness was normal. Systolic function was normal. - Right atrium: The atrium was normal in size. - Tricuspid valve: There was mild regurgitation. - Pulmonic valve: There was no regurgitation. - Pulmonary arteries: Systolic pressure was within the normal range. - Inferior vena cava: The vessel was normal in size. - Pericardium, extracardiac: There was no pericardial effusion.  04/2016 head CT 1. Mild patchy enhancement at the left anterior frontal lobe treatment site (series 11, image 39) without mass effect and stable surrounding white matter hypodensity without strong evidence of acute vasogenic edema. Recommend continued CT surveillance. 2. No new metastatic disease or new intracranial abnormality identified. 3. Probable benign 14 mm left parietal bone lucent lesion, unchanged since May 2017.  05/2016 PET 1. Stable exam with no evidence progression. 2. Photopenia in the LEFT frontal lobe at prior surgical site. 3. Stable mildly hypermetabolic RIGHT paratracheal node. This may represent a  thyroid nodule. 4. Stable size of minimally metabolic lingular pulmonary nodule. 5. Stable skeletal lesion of the RIGHT iliac crest 6. No cutaneous metabolic lesion.  Component     Latest Ref Rng & Units 08/23/2015 08/24/2015  Cholesterol     0 - 200 mg/dL  215 (H)  Triglycerides      <150 mg/dL  196 (H)  HDL Cholesterol     >40 mg/dL  41  Total CHOL/HDL Ratio     RATIO  5.2  VLDL     0 - 40 mg/dL  39  LDL (calc)     0 - 99 mg/dL  135 (H)  Hemoglobin A1C     4.8 - 5.6 % 6.1 (H)   Mean Plasma Glucose     mg/dL 128   TSH     0.350 - 4.500 uIU/mL 3.902   Vitamin B12     180 - 914 pg/mL 471     Assessment: As you may recall, he is a 76 y.o. Caucasian male with PMH of hypertension, hypercholesterolemia, sleep apnea, skin melanoma and pacemaker placement for AV node disorder admitted on 08/22/15 for left frontal large ICH. CTA head showed no vascular pathology. CUS and TTE unremarkable. Repeat CT head stable. LDL 135 and A1C 6.1. ICH etiology unclear, concerning for HTN or CAA at that time. Recommended to repeat CT in 3-4 weeks. He was discharged to CIR on 08/26/15 with crestor. Chest CT on 08/23/15 showed left LL mass but due to high Cre, CT chest was done later in CIR showed LUL malignancy. PET done on 09/16/15 confirmed LUL tumor but also showed multiple metastasis in the body. 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 Neurosurgery involved and pt had repeat CT on 10/13/15 showed growing left frontal lesion. Left frontal debulk surgery done on 10/16/15. Pt also underwent chemotherapy for melanoma metastasis and repeat PET on 12/23/15 showed excellent response to chemo. He continues following with Drs. Ludwig Clarks, repeat CT head in 04/2016 and PET in 05/2016 showed continued responding to chemo. Cardiologist put him back on ASA '81mg'$  and crestor. Currently, pt doing well, no significant neuro deficit.   Plan:  - continue ASA '81mg'$  and crestor for cardiac prevention - continue follow up with oncology Dr. Burr Medico - continue follow up with neurosurgery Dr. Vertell Limber and Dr. Tammi Klippel - follow up with cardiology - Follow up with your primary care physician for stroke risk factor modification. Recommend maintain blood pressure goal <130/80,  diabetes with hemoglobin A1c goal below 7.0% and lipids with LDL cholesterol goal below 70 mg/dL.  - check BP at home  - follow up as needed.   No orders of the defined types were placed in this encounter.   Meds ordered this encounter  Medications  . ASPIRIN LOW DOSE 81 MG EC tablet    Sig: TK 1 T PO QD    Refill:  11  . sertraline (ZOLOFT) 50 MG tablet    Patient Instructions  - continue ASA '81mg'$  and crestor for cardiac prevention - continue follow up with oncology Dr. Burr Medico - continue follow up with neurosurgery Dr. Vertell Limber and Dr. Tammi Klippel - follow up with cardiology - Follow up with your primary care physician for stroke risk factor modification. Recommend maintain blood pressure goal <130/80, diabetes with hemoglobin A1c goal below 7.0% and lipids with LDL cholesterol goal below 70 mg/dL.  - check BP at home  - follow up as needed.  Rosalin Hawking, MD PhD [facility] [address], [place], [place] [address] [phone]

## 2016-06-27 NOTE — Patient Instructions (Addendum)
-   continue ASA '81mg'$  and crestor for cardiac prevention - continue follow up with oncology Dr. Burr Medico - continue follow up with neurosurgery Dr. Vertell Limber and Dr. Tammi Klippel - follow up with cardiology - Follow up with your primary care physician for stroke risk factor modification. Recommend maintain blood pressure goal <130/80, diabetes with hemoglobin A1c goal below 7.0% and lipids with LDL cholesterol goal below 70 mg/dL.  - check BP at home  - follow up as needed.

## 2016-06-28 ENCOUNTER — Ambulatory Visit (HOSPITAL_BASED_OUTPATIENT_CLINIC_OR_DEPARTMENT_OTHER): Payer: Medicare Other | Admitting: Hematology

## 2016-06-28 ENCOUNTER — Encounter: Payer: Self-pay | Admitting: Neurology

## 2016-06-28 ENCOUNTER — Ambulatory Visit (HOSPITAL_BASED_OUTPATIENT_CLINIC_OR_DEPARTMENT_OTHER): Payer: Medicare Other

## 2016-06-28 ENCOUNTER — Telehealth: Payer: Self-pay | Admitting: Hematology

## 2016-06-28 ENCOUNTER — Other Ambulatory Visit (HOSPITAL_BASED_OUTPATIENT_CLINIC_OR_DEPARTMENT_OTHER): Payer: Medicare Other

## 2016-06-28 ENCOUNTER — Encounter: Payer: Self-pay | Admitting: Hematology

## 2016-06-28 VITALS — BP 142/98 | HR 75 | Temp 98.3°F | Resp 18 | Ht 69.5 in | Wt 205.1 lb

## 2016-06-28 DIAGNOSIS — C799 Secondary malignant neoplasm of unspecified site: Secondary | ICD-10-CM

## 2016-06-28 DIAGNOSIS — I10 Essential (primary) hypertension: Secondary | ICD-10-CM

## 2016-06-28 DIAGNOSIS — Z79899 Other long term (current) drug therapy: Secondary | ICD-10-CM | POA: Diagnosis not present

## 2016-06-28 DIAGNOSIS — I619 Nontraumatic intracerebral hemorrhage, unspecified: Secondary | ICD-10-CM

## 2016-06-28 DIAGNOSIS — C439 Malignant melanoma of skin, unspecified: Secondary | ICD-10-CM

## 2016-06-28 DIAGNOSIS — C7931 Secondary malignant neoplasm of brain: Secondary | ICD-10-CM

## 2016-06-28 DIAGNOSIS — Z5112 Encounter for antineoplastic immunotherapy: Secondary | ICD-10-CM

## 2016-06-28 DIAGNOSIS — G4733 Obstructive sleep apnea (adult) (pediatric): Secondary | ICD-10-CM

## 2016-06-28 DIAGNOSIS — N183 Chronic kidney disease, stage 3 (moderate): Secondary | ICD-10-CM | POA: Diagnosis not present

## 2016-06-28 DIAGNOSIS — E039 Hypothyroidism, unspecified: Secondary | ICD-10-CM

## 2016-06-28 DIAGNOSIS — C7951 Secondary malignant neoplasm of bone: Secondary | ICD-10-CM | POA: Diagnosis not present

## 2016-06-28 DIAGNOSIS — C773 Secondary and unspecified malignant neoplasm of axilla and upper limb lymph nodes: Secondary | ICD-10-CM

## 2016-06-28 DIAGNOSIS — N182 Chronic kidney disease, stage 2 (mild): Secondary | ICD-10-CM

## 2016-06-28 LAB — CBC WITH DIFFERENTIAL/PLATELET
BASO%: 0.9 % (ref 0.0–2.0)
Basophils Absolute: 0 10*3/uL (ref 0.0–0.1)
EOS ABS: 0.3 10*3/uL (ref 0.0–0.5)
EOS%: 6.5 % (ref 0.0–7.0)
HEMATOCRIT: 44.4 % (ref 38.4–49.9)
HEMOGLOBIN: 15.2 g/dL (ref 13.0–17.1)
LYMPH#: 0.5 10*3/uL — AB (ref 0.9–3.3)
LYMPH%: 11.9 % — ABNORMAL LOW (ref 14.0–49.0)
MCH: 32.5 pg (ref 27.2–33.4)
MCHC: 34.2 g/dL (ref 32.0–36.0)
MCV: 95.2 fL (ref 79.3–98.0)
MONO#: 0.5 10*3/uL (ref 0.1–0.9)
MONO%: 11.8 % (ref 0.0–14.0)
NEUT%: 68.9 % (ref 39.0–75.0)
NEUTROS ABS: 2.9 10*3/uL (ref 1.5–6.5)
PLATELETS: 177 10*3/uL (ref 140–400)
RBC: 4.67 10*6/uL (ref 4.20–5.82)
RDW: 14.1 % (ref 11.0–14.6)
WBC: 4.3 10*3/uL (ref 4.0–10.3)

## 2016-06-28 LAB — COMPREHENSIVE METABOLIC PANEL
ALBUMIN: 3.8 g/dL (ref 3.5–5.0)
ALK PHOS: 54 U/L (ref 40–150)
ALT: 23 U/L (ref 0–55)
ANION GAP: 9 meq/L (ref 3–11)
AST: 23 U/L (ref 5–34)
BILIRUBIN TOTAL: 0.44 mg/dL (ref 0.20–1.20)
BUN: 28.8 mg/dL — ABNORMAL HIGH (ref 7.0–26.0)
CO2: 23 mEq/L (ref 22–29)
CREATININE: 1.4 mg/dL — AB (ref 0.7–1.3)
Calcium: 9.7 mg/dL (ref 8.4–10.4)
Chloride: 107 mEq/L (ref 98–109)
EGFR: 47 mL/min/{1.73_m2} — AB (ref >90)
Glucose: 95 mg/dl (ref 70–140)
Potassium: 4.6 mEq/L (ref 3.5–5.1)
Sodium: 140 mEq/L (ref 136–145)
TOTAL PROTEIN: 6.7 g/dL (ref 6.4–8.3)

## 2016-06-28 LAB — TSH: TSH: 7.453 m(IU)/L — ABNORMAL HIGH (ref 0.320–4.118)

## 2016-06-28 MED ORDER — SODIUM CHLORIDE 0.9 % IV SOLN
Freq: Once | INTRAVENOUS | Status: AC
  Administered 2016-06-28: 12:00:00 via INTRAVENOUS

## 2016-06-28 MED ORDER — DENOSUMAB 120 MG/1.7ML ~~LOC~~ SOLN
120.0000 mg | Freq: Once | SUBCUTANEOUS | Status: AC
  Administered 2016-06-28: 120 mg via SUBCUTANEOUS
  Filled 2016-06-28: qty 1.7

## 2016-06-28 MED ORDER — SODIUM CHLORIDE 0.9 % IV SOLN
240.0000 mg | Freq: Once | INTRAVENOUS | Status: AC
  Administered 2016-06-28: 240 mg via INTRAVENOUS
  Filled 2016-06-28: qty 24

## 2016-06-28 NOTE — Telephone Encounter (Signed)
Gave patient avs report and appointments for April.  No los complete for 3/21 f/u and no schedule message sent. Per patient he usually see's YF with every other treatment.   Added chemo per care plan and spoke with desk nurse re confirming f/u. Desk nurse will get back to me to confirm f/u.

## 2016-06-28 NOTE — Patient Instructions (Signed)
[facility] Discharge Instructions for Patients Receiving Chemotherapy  Today you received the following chemotherapy agents Opdivo   To help prevent nausea and vomiting after your treatment, we encourage you to take your nausea medication as directed.    If you develop nausea and vomiting that is not controlled by your nausea medication, call the clinic.   BELOW ARE SYMPTOMS THAT SHOULD BE REPORTED IMMEDIATELY:  *FEVER GREATER THAN 100.5 F  *CHILLS WITH OR WITHOUT FEVER  NAUSEA AND VOMITING THAT IS NOT CONTROLLED WITH YOUR NAUSEA MEDICATION  *UNUSUAL SHORTNESS OF BREATH  *UNUSUAL BRUISING OR BLEEDING  TENDERNESS IN MOUTH AND THROAT WITH OR WITHOUT PRESENCE OF ULCERS  *URINARY PROBLEMS  *BOWEL PROBLEMS  UNUSUAL RASH Items with * indicate a potential emergency and should be followed up as soon as possible.  Feel free to call the clinic you have any questions or concerns. The clinic phone number is [phone].  Please show the [facility] at check-in to the Emergency Department and triage nurse.

## 2016-06-29 ENCOUNTER — Other Ambulatory Visit: Payer: Self-pay | Admitting: Radiation Therapy

## 2016-06-29 DIAGNOSIS — C7931 Secondary malignant neoplasm of brain: Secondary | ICD-10-CM

## 2016-07-10 DIAGNOSIS — E291 Testicular hypofunction: Secondary | ICD-10-CM | POA: Diagnosis not present

## 2016-07-12 ENCOUNTER — Ambulatory Visit (HOSPITAL_BASED_OUTPATIENT_CLINIC_OR_DEPARTMENT_OTHER): Payer: Medicare Other

## 2016-07-12 ENCOUNTER — Other Ambulatory Visit (HOSPITAL_BASED_OUTPATIENT_CLINIC_OR_DEPARTMENT_OTHER): Payer: Medicare Other

## 2016-07-12 VITALS — BP 138/87 | HR 82 | Temp 98.4°F | Resp 17

## 2016-07-12 DIAGNOSIS — C799 Secondary malignant neoplasm of unspecified site: Secondary | ICD-10-CM

## 2016-07-12 DIAGNOSIS — C439 Malignant melanoma of skin, unspecified: Secondary | ICD-10-CM

## 2016-07-12 DIAGNOSIS — Z79899 Other long term (current) drug therapy: Secondary | ICD-10-CM

## 2016-07-12 DIAGNOSIS — Z5112 Encounter for antineoplastic immunotherapy: Secondary | ICD-10-CM

## 2016-07-12 DIAGNOSIS — C7800 Secondary malignant neoplasm of unspecified lung: Secondary | ICD-10-CM | POA: Diagnosis not present

## 2016-07-12 DIAGNOSIS — C7931 Secondary malignant neoplasm of brain: Secondary | ICD-10-CM

## 2016-07-12 DIAGNOSIS — C7951 Secondary malignant neoplasm of bone: Secondary | ICD-10-CM

## 2016-07-12 LAB — COMPREHENSIVE METABOLIC PANEL
ALT: 16 U/L (ref 0–55)
AST: 19 U/L (ref 5–34)
Albumin: 3.9 g/dL (ref 3.5–5.0)
Alkaline Phosphatase: 47 U/L (ref 40–150)
Anion Gap: 9 mEq/L (ref 3–11)
BUN: 33.1 mg/dL — AB (ref 7.0–26.0)
CO2: 23 meq/L (ref 22–29)
CREATININE: 1.4 mg/dL — AB (ref 0.7–1.3)
Calcium: 9.8 mg/dL (ref 8.4–10.4)
Chloride: 107 mEq/L (ref 98–109)
EGFR: 48 mL/min/{1.73_m2} — ABNORMAL LOW (ref >90)
Glucose: 149 mg/dl — ABNORMAL HIGH (ref 70–140)
POTASSIUM: 4.5 meq/L (ref 3.5–5.1)
SODIUM: 139 meq/L (ref 136–145)
Total Bilirubin: 0.44 mg/dL (ref 0.20–1.20)
Total Protein: 6.9 g/dL (ref 6.4–8.3)

## 2016-07-12 LAB — CBC WITH DIFFERENTIAL/PLATELET
BASO%: 0.4 % (ref 0.0–2.0)
Basophils Absolute: 0 10*3/uL (ref 0.0–0.1)
EOS%: 4.8 % (ref 0.0–7.0)
Eosinophils Absolute: 0.3 10*3/uL (ref 0.0–0.5)
HCT: 46.4 % (ref 38.4–49.9)
HGB: 15.7 g/dL (ref 13.0–17.1)
LYMPH#: 0.5 10*3/uL — AB (ref 0.9–3.3)
LYMPH%: 10.3 % — AB (ref 14.0–49.0)
MCH: 31.8 pg (ref 27.2–33.4)
MCHC: 33.8 g/dL (ref 32.0–36.0)
MCV: 94.1 fL (ref 79.3–98.0)
MONO#: 0.5 10*3/uL (ref 0.1–0.9)
MONO%: 9.5 % (ref 0.0–14.0)
NEUT#: 3.9 10*3/uL (ref 1.5–6.5)
NEUT%: 75 % (ref 39.0–75.0)
Platelets: 147 10*3/uL (ref 140–400)
RBC: 4.93 10*6/uL (ref 4.20–5.82)
RDW: 13.3 % (ref 11.0–14.6)
WBC: 5.2 10*3/uL (ref 4.0–10.3)

## 2016-07-12 LAB — TSH: TSH: 8.596 m[IU]/L — AB (ref 0.320–4.118)

## 2016-07-12 MED ORDER — SODIUM CHLORIDE 0.9 % IV SOLN
Freq: Once | INTRAVENOUS | Status: AC
  Administered 2016-07-12: 14:00:00 via INTRAVENOUS

## 2016-07-12 MED ORDER — SODIUM CHLORIDE 0.9 % IV SOLN
240.0000 mg | Freq: Once | INTRAVENOUS | Status: AC
  Administered 2016-07-12: 240 mg via INTRAVENOUS
  Filled 2016-07-12: qty 24

## 2016-07-12 NOTE — Patient Instructions (Signed)
[facility] Discharge Instructions for Patients Receiving Chemotherapy  Today you received the following chemotherapy agents:  Nivolumab.  To help prevent nausea and vomiting after your treatment, we encourage you to take your nausea medication as directed.   If you develop nausea and vomiting that is not controlled by your nausea medication, call the clinic.   BELOW ARE SYMPTOMS THAT SHOULD BE REPORTED IMMEDIATELY:  *FEVER GREATER THAN 100.5 F  *CHILLS WITH OR WITHOUT FEVER  NAUSEA AND VOMITING THAT IS NOT CONTROLLED WITH YOUR NAUSEA MEDICATION  *UNUSUAL SHORTNESS OF BREATH  *UNUSUAL BRUISING OR BLEEDING  TENDERNESS IN MOUTH AND THROAT WITH OR WITHOUT PRESENCE OF ULCERS  *URINARY PROBLEMS  *BOWEL PROBLEMS  UNUSUAL RASH Items with * indicate a potential emergency and should be followed up as soon as possible.  Feel free to call the clinic you have any questions or concerns. The clinic phone number is [phone].  Please show the CHEMO ALERT CARD at check-in to the Emergency Department and triage nurse.   

## 2016-07-19 NOTE — Progress Notes (Signed)
David [name] 76 y.o. 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   PAIN: He is currently not having pain. NEURO:Alert and oriented x 3 with fluent speech,able to complete sentences without difficulty with word finding or organization of sentences. Having problems not remembering at times.  Reports,denies any visual disturbance,blurred vision,double vision,blind spots or peripheral vision changes, Denies reports any nausea or vomiting,ataxia,ring of ears,dizziness,denies headache.  SKIN:Warm and dry. Started a steroid five days ago does not know the name of the medication, has taken it 11 times and he reports feeling better, will call the name of the medication back to [place]. Wt Readings from Last 3 Encounters:  07/24/16 203 lb 9.6 oz (92.4 kg)  06/28/16 205 lb 1.6 oz (93 kg)  06/27/16 206 lb (93.4 kg)  BP (!) 142/92   Pulse 77   Temp 97.8 F (36.6 C) (Oral)   Resp 18   Ht 5' 9.5" (1.765 m)   Wt 203 lb 9.6 oz (92.4 kg)   SpO2 98%   BMI 29.64 kg/m

## 2016-07-20 ENCOUNTER — Ambulatory Visit (HOSPITAL_COMMUNITY)
Admission: RE | Admit: 2016-07-20 | Discharge: 2016-07-20 | Disposition: A | Discharge status: Home / Self Care | Payer: Medicare Other | Source: Ambulatory Visit | Attending: Radiation Oncology | Admitting: Radiation Oncology

## 2016-07-20 DIAGNOSIS — C7931 Secondary malignant neoplasm of brain: Secondary | ICD-10-CM

## 2016-07-20 DIAGNOSIS — R930 Abnormal findings on diagnostic imaging of skull and head, not elsewhere classified: Secondary | ICD-10-CM | POA: Diagnosis not present

## 2016-07-20 MED ORDER — IOPAMIDOL (ISOVUE-300) INJECTION 61%
INTRAVENOUS | Status: AC
  Filled 2016-07-20: qty 75

## 2016-07-20 MED ORDER — IOPAMIDOL (ISOVUE-300) INJECTION 61%
75.0000 mL | Freq: Once | INTRAVENOUS | Status: AC | PRN
Start: 2016-07-20 — End: 2016-07-20
  Administered 2016-07-20: 75 mL via INTRAVENOUS

## 2016-07-24 ENCOUNTER — Encounter: Payer: Self-pay | Admitting: Radiation Oncology

## 2016-07-24 ENCOUNTER — Ambulatory Visit
Admission: RE | Admit: 2016-07-24 | Discharge: 2016-07-24 | Disposition: A | Discharge status: Home / Self Care | Payer: Medicare Other | Source: Ambulatory Visit | Attending: Radiation Oncology | Admitting: Radiation Oncology

## 2016-07-24 ENCOUNTER — Telehealth: Payer: Self-pay | Admitting: Hematology

## 2016-07-24 VITALS — BP 142/92 | HR 77 | Temp 97.8°F | Resp 18 | Ht 69.5 in | Wt 203.6 lb

## 2016-07-24 DIAGNOSIS — C439 Malignant melanoma of skin, unspecified: Secondary | ICD-10-CM | POA: Diagnosis not present

## 2016-07-24 DIAGNOSIS — E7800 Pure hypercholesterolemia, unspecified: Secondary | ICD-10-CM | POA: Diagnosis not present

## 2016-07-24 DIAGNOSIS — Z87891 Personal history of nicotine dependence: Secondary | ICD-10-CM | POA: Diagnosis not present

## 2016-07-24 DIAGNOSIS — K219 Gastro-esophageal reflux disease without esophagitis: Secondary | ICD-10-CM | POA: Insufficient documentation

## 2016-07-24 DIAGNOSIS — E291 Testicular hypofunction: Secondary | ICD-10-CM | POA: Diagnosis not present

## 2016-07-24 DIAGNOSIS — I10 Essential (primary) hypertension: Secondary | ICD-10-CM | POA: Diagnosis not present

## 2016-07-24 DIAGNOSIS — N400 Enlarged prostate without lower urinary tract symptoms: Secondary | ICD-10-CM | POA: Insufficient documentation

## 2016-07-24 DIAGNOSIS — Z951 Presence of aortocoronary bypass graft: Secondary | ICD-10-CM | POA: Insufficient documentation

## 2016-07-24 DIAGNOSIS — I252 Old myocardial infarction: Secondary | ICD-10-CM | POA: Insufficient documentation

## 2016-07-24 DIAGNOSIS — C7931 Secondary malignant neoplasm of brain: Secondary | ICD-10-CM | POA: Insufficient documentation

## 2016-07-24 DIAGNOSIS — I69151 Hemiplegia and hemiparesis following nontraumatic intracerebral hemorrhage affecting right dominant side: Secondary | ICD-10-CM | POA: Diagnosis not present

## 2016-07-24 DIAGNOSIS — C7800 Secondary malignant neoplasm of unspecified lung: Secondary | ICD-10-CM | POA: Insufficient documentation

## 2016-07-24 DIAGNOSIS — Z85820 Personal history of malignant melanoma of skin: Secondary | ICD-10-CM | POA: Diagnosis not present

## 2016-07-24 DIAGNOSIS — Z08 Encounter for follow-up examination after completed treatment for malignant neoplasm: Secondary | ICD-10-CM | POA: Diagnosis not present

## 2016-07-24 DIAGNOSIS — I2510 Atherosclerotic heart disease of native coronary artery without angina pectoris: Secondary | ICD-10-CM | POA: Insufficient documentation

## 2016-07-24 DIAGNOSIS — Z7952 Long term (current) use of systemic steroids: Secondary | ICD-10-CM | POA: Diagnosis not present

## 2016-07-24 DIAGNOSIS — E039 Hypothyroidism, unspecified: Secondary | ICD-10-CM | POA: Diagnosis not present

## 2016-07-24 DIAGNOSIS — G4730 Sleep apnea, unspecified: Secondary | ICD-10-CM | POA: Diagnosis not present

## 2016-07-24 MED ORDER — PENTOXIFYLLINE ER 400 MG PO TBCR: 400.0000 mg | EXTENDED_RELEASE_TABLET | Freq: Two times a day (BID) | ORAL | 6 refills | Status: DC

## 2016-07-24 MED ORDER — VITAMIN E 180 MG (400 UNIT) PO CAPS: 400.0000 [IU] | ORAL_CAPSULE | Freq: Two times a day (BID) | ORAL | 6 refills | Status: DC

## 2016-07-24 NOTE — Telephone Encounter (Signed)
Per 4/16 schedule message moved all appointments to 5/2. Moved appointments from 4/18 to 5/3 - date per pt due to he is out of town 5/2. Patient aware 4/18 appointments cxd. Also per patient he has an injection appointment at the tail end of his treatment - is the injection being done 5/3 or should he come in for that this month. Spoke to desk nurse and she will get back to me.

## 2016-07-24 NOTE — Progress Notes (Signed)
Radiation Oncology         [phone]) [phone] ________________________________  Name: David [name] MRN: [id number]  Date: 07/24/2016  DOB: [birth year]  Follow-Up Visit Note  CC: [name], MD  [name], MD  Diagnosis:  76 y.o. gentleman with metastatic brain disease from melanoma    ICD-9-CM ICD-10-CM   1. Metastasis to brain (HCC) 198.3 C79.31   2. Hemiplegia and hemiparesis following nontraumatic intracerebral hemorrhage affecting right dominant side (HCC) 438.21 I69.151   3. Secondary malignant melanoma of lung, unspecified laterality ([place]) 197.0 C78.00    172.9      Interval Since Last Radiation: 6 months  01/27/16 SRS Treatment:  20 Gy in 1 fraction to a 6 mm right cerebellar brain met   10/13/2015 Preop SRS treatment: 14 Gy  to the left frontal lesion in 1 fraction  Narrative:  In summary this is a pleasant 76 y.o. gentleman with a history of metastatic melanoma treated on two occasions with SRS to the brain, currently undergoing nivolimab with Dr. Burr Medico for systemic treatment of his disease. He has developed hypothyroidism as a result of his immunotherapy. He developed difficulty with word recall and word finding and he did have a CT brain on 07/20/16 revealing left frontal vasogenic edema with stability in the enhancement of the previously treated resection site. There is new enhancement along the lateral ventricle measuring now about 3.2 cm. This is concerning per our discussion in conference for radionecrosis.   On review of systems, the patient reports that he feels much better since starting steroids. He is currently taking 4 mg po TID. His speech has improved per report and he denies any headaches, changes in visual or auditory acuity. He denies any chest pain, shortness of breath, cough, fevers, chills, night sweats, unintended weight changes. He denies any bowel or bladder disturbances, and denies abdominal pain, nausea or vomiting. He denies any new musculoskeletal or  joint aches or pains, new skin lesions or concerns. A complete review of systems is obtained and is otherwise negative.   Past Medical History:  Past Medical History:  Diagnosis Date  . Anginal pain ([place])   . Arthritis    mostly hands  . Benign familial hematuria   . BPH (benign prostatic hypertrophy)   . Brain cancer ([place])    melanoma with brain met  . Coronary artery disease   . GERD (gastroesophageal reflux disease)   . High cholesterol   . Hypertension   . Hypothyroidism   . Intracerebral hemorrhage ([place])   . Melanoma ([place])   . Metastatic melanoma to lung ([place])   . Mood disorder ([place])   . Myocardial infarction ([place])   . Pacemaker    due to syncope, 3rd degree HB (upgrade to [facility]. Jude CRT-P 02/25/13 (Dr. Uvaldo Rising)  . Rectal bleed    due to NSAIDS  . Renal disorder   . Skin cancer    melanoma  . Sleep apnea    uses CPAP  . Stroke [facility])     Past Surgical History: Past Surgical History:  Procedure Laterality Date  . APPLICATION OF CRANIAL NAVIGATION N/A 10/15/2015   Procedure: APPLICATION OF CRANIAL NAVIGATION;  Surgeon: Erline Levine, MD;  Location: [place] NEURO ORS;  Service: Neurosurgery;  Laterality: N/A;  . CARDIAC CATHETERIZATION  2013  . CARDIAC SURGERY     bypass X 2  . COLONOSCOPY WITH PROPOFOL N/A 05/08/2016   Procedure: COLONOSCOPY WITH PROPOFOL;  Surgeon: Jonathon Bellows, MD;  Location[facility];  Service: Gastroenterology;  Laterality: N/A;  . CORONARY ARTERY BYPASS GRAFT  2013   LIMA-LAD, SVG-PDA 03/27/11 (Dr. Francee Gentile)  . CRANIOTOMY N/A 10/15/2015   Procedure: LEFT FRONTAL CRANIOTOMY TUMOR EXCISION with Curve;  Surgeon: Erline Levine, MD;  Location: [place] NEURO ORS;  Service: Neurosurgery;  Laterality: N/A;  CRANIOTOMY TUMOR EXCISION  . EYE SURGERY    . FOOT SURGERY  08/2010  . JOINT REPLACEMENT Left    partial knee  . KNEE ARTHROSCOPY  12/2008  . LYMPH NODE BIOPSY    . PACEMAKER INSERTION    . TONSILLECTOMY      Social History:  Social History   Social  History  . Marital status: Married    Spouse name: N/A  . Number of children: 2  . Years of education: N/A   Occupational History  . retired Pharmacist, community    Social History Main Topics  . Smoking status: Former Smoker    Packs/day: 1.00    Years: 3.00    Quit date: 04/11/1919  . Smokeless tobacco: Never Used  . Alcohol use 24.0 oz/week    30 Glasses of wine, 10 Cans of beer per week     Comment: 10 per week, wine per week  . Drug use: No  . Sexual activity: No   Other Topics Concern  . Not on file   Social History Narrative   Wife has dementia   Daughter [name] and son      Has living will   [name] is health care POA   Requests DNR-- done 09/14/15   Not sure about tube feeds    Family History: Family History  Problem Relation Age of Onset  . Cancer Mother 59    lung   . Hypertension Mother   . Hypertension Father   . Heart attack Father   . Heart disease Father   . Rheum arthritis Sister               ALLERGIES:  is allergic to zocor [simvastatin].  Meds: Current Outpatient Prescriptions  Medication Sig Dispense Refill  . acetaminophen (TYLENOL) 325 MG tablet Take 2 tablets (650 mg total) by mouth every 6 (six) hours as needed for mild pain. (Patient taking differently: Take 650 mg by mouth 2 (two) times daily. )    . amLODipine (NORVASC) 2.5 MG tablet Take 2.5 mg by mouth daily.    . ASPIRIN LOW DOSE 81 MG EC tablet TK 1 T PO QD  11  . carvedilol (COREG) 6.25 MG tablet Take 6.25 mg by mouth 2 (two) times daily with a meal.    . ferrous sulfate 325 (65 FE) MG tablet Take 1 tablet (325 mg total) by mouth daily with breakfast. 30 tablet 0  . levothyroxine (SYNTHROID, LEVOTHROID) 125 MCG tablet Take 1 tablet (125 mcg total) by mouth daily before breakfast. 39 tablet 9  . Multiple Vitamin (MULTIVITAMIN WITH MINERALS) TABS tablet Take 1 tablet by mouth daily.    Marland Kitchen PRESCRIPTION MEDICATION Inhale into the lungs at bedtime. CPAP    . rosuvastatin (CRESTOR)  10 MG tablet Take 10 mg by mouth at bedtime.    . sertraline (ZOLOFT) 50 MG tablet     . testosterone cypionate (DEPOTESTOTERONE CYPIONATE) 100 MG/ML injection Inject 100 mg into the muscle every 14 (fourteen) days.     . pentoxifylline (TRENTAL) 400 MG CR tablet Take 1 tablet (400 mg total) by mouth 2 (two) times daily. 60 tablet 6  . vitamin E (VITAMIN E) 400  UNIT capsule Take 1 capsule (400 Units total) by mouth 2 (two) times daily. 60 capsule 6   No current facility-administered medications for this encounter.    Facility-Administered Medications Ordered in Other Encounters  Medication Dose Route Frequency Provider Last Rate Last Dose  . 0.9 %  sodium chloride infusion  1,000 mL Intravenous Once [name], MD        Physical Findings:   height is 5' 9.5" (1.765 m) and weight is 203 lb 9.6 oz (92.4 kg). His oral temperature is 97.8 F (36.6 C). His blood pressure is 142/92 (abnormal) and his pulse is 77. His respiration is 18 and oxygen saturation is 98%. .   In general this is a well appearing caucasian male in no acute distress. He's alert and oriented x4 and appropriate throughout the examination. Cardiopulmonary assessment is negative for acute distress and he exhibits normal effort. HEENT reveals that he is normocephalic, atraumatic, EOMs are intact. He has erythematous changes along the nose bridge and nasal labia folds. This is without infection or cellulitis. Maculopapular rash also noted of bilateral upper extremities associated with immunotherapy. This exam is unchanged from prior as well. He does not have any focal neurologic abnormalities.  Lab Findings: Lab Results  Component Value Date   WBC 5.2 07/12/2016   WBC 5.2 05/07/2016   HGB 15.7 07/12/2016   HCT 46.4 07/12/2016   PLT 147 07/12/2016    Lab Results  Component Value Date   NA 139 07/12/2016   K 4.5 07/12/2016   CHLORIDE 107 07/12/2016   CO2 23 07/12/2016   GLUCOSE 149 (H) 07/12/2016   BUN 33.1 (H) 07/12/2016     CREATININE 1.4 (H) 07/12/2016   BILITOT 0.44 07/12/2016   ALKPHOS 47 07/12/2016   AST 19 07/12/2016   ALT 16 07/12/2016   PROT 6.9 07/12/2016   ALBUMIN 3.9 07/12/2016   CALCIUM 9.8 07/12/2016   ANIONGAP 9 07/12/2016   ANIONGAP 6 05/07/2016    Radiographic Findings: Ct Head W Wo Contrast  Result Date: 07/20/2016 CLINICAL DATA:  Metastatic melanoma.  Follow-up. EXAM: CT HEAD WITHOUT AND WITH CONTRAST TECHNIQUE: Contiguous axial images were obtained from the base of the skull through the vertex without and with intravenous contrast CONTRAST:  15m ISOVUE-300 IOPAMIDOL (ISOVUE-300) INJECTION 61% COMPARISON:  04/20/2016.  01/13/2016.  10/17/2015.  10/04/2015. FINDINGS: Brain: There is marked increase an vasogenic edema throughout the left frontal lobe with mass effect resulting and flattening of the frontal horn of the left lateral ventricle compared to the previous configuration which showed postsurgical ex vacuo dilatation. The region of low level enhancement extending from the lateral margin of the frontal horn of lateral ventricle to the left frontal surface appears quite similar, marked with opposing arrows. There is a new region of abnormal enhancement surrounding the frontal horn of the left lateral ventricle, measuring approximately 3.2 cm in diameter. These findings are indeterminate for recurrent tumor versus radiation necrosis surrounding the frontal horn. No evidence of hemorrhage. No second lesion seen elsewhere within the brain. No evidence of obstructive hydrocephalus. No extra-axial fluid collection. Vascular: Major vessels at the base of the brain show flow. Skull: Previous craniotomy changes. Chronic benign left parietal lucency is unchanged. Sinuses/Orbits: Negative Other: None significant IMPRESSION: Pronounced change in the left frontal region. Much more regional vasogenic edema. Relatively stable appearance of the abnormal enhancement extending from the lateral margin of the  frontal horn of left lateral ventricle to the frontal brain surface. New region of abnormal enhancement  surrounding the frontal horn of the left lateral ventricle measuring approximately 3.2 cm. The differential diagnosis is recurrent tumor versus radiation necrosis. The patient is reportedly not an MRI candidate. This area is large enough that CT perfusion could possibly make a reasonable differentiation. Electronically Signed   By: Nelson Chimes M.D.   On: 07/20/2016 13:06    Impression/Plan: 1. Metastatic Melanoma. Dr. Kenton Kingfisher appears to be doing well since starting steroid therapy. We reviewed his images today and believe his symptoms are due to radiation necrosis, which he's at higher risk of due to his immunotherapy. We discussed the use of Trental 449m BID, and Vitamin E 400 IU BID. These were called to his pharmacy, and we have also made recommendations to taper his steroid stargin 07/26/16 to 459mBID, on 07/30/16 to 2 mg (1/2 tab) BID, and on 08/04/16 to 63m78m1/2 tab) daily. I will check in on him later this week to make sure he's doing ok with the adjustments. Dr. FenBurr Medicoans to hold his immunotherapy for about 2 weeks so we can administer steroids. We will plan to repeat his CT head as he cannot have MRI in about 2 months.  2. Hypothyroidism. The patient continues on levothyroxine for his hypothyroidism. We will follow this expectantly.      AliCarola RhineAC Seen with    MatSheral ApleyanTammi Klippel.D.

## 2016-07-26 ENCOUNTER — Ambulatory Visit: Payer: Medicare Other

## 2016-07-26 ENCOUNTER — Other Ambulatory Visit: Payer: Medicare Other

## 2016-07-26 ENCOUNTER — Ambulatory Visit: Payer: Medicare Other | Admitting: Hematology

## 2016-08-09 NOTE — Progress Notes (Signed)
David [name]  Telephone:([phone]) [phone] Fax:(336) [phone]  Clinic Follow up Note   Patient Care Team: David Poot, MD as PCP - General (Internal Medicine) 08/10/2016  Chief complaint: Follow-up metastatic melanoma  Oncology History   Metastatic melanoma [facility]   Staging form: Melanoma of the Skin, AJCC 7th Edition     Clinical stage from 09/21/2015: Stage IV [place], [place], [place]) - Signed by [name], MD on 10/09/2015       Metastatic melanoma ([place])   09/16/2015 Imaging    PET scan showed a hypermetabolic 3.7DS lingular nodule, and a 84m right upper lobe lung nodule, left subclavicular nodes, and bone metastasis in the proximal right humerus and right femur.       09/21/2015 Initial Diagnosis    Metastatic melanoma [place]      09/21/2015 Initial Biopsy    Left subclavicular lymph node biopsy showed prostatic of melanoma      09/21/2015 Miscellaneous    BRAF V600K mutation (+)       10/04/2015 Imaging    CT head with without contrast showed a 2.0 x 1.6 x 1.7 cm superficial left frontal hyperdense lesion, with postcontrast enhancement, lying within the previous hemorrhagic area, concerning for solitary melanoma metastasis      10/06/2015 -  Chemotherapy    Nivolumab 2424mevery 2 weeks       10/13/2015 - 10/13/2015 Radiation Therapy    10/13/2015 Preop SRS treatment: 14 Gy  to the left frontal lesion in 1 fraction      10/15/2015 Surgery    left front craniotomy and tumor excision       10/15/2015 Pathology Results    left frontal brain mass excision showed metastatic melanoma, 1.5X1.5X0.6cm      01/13/2016 Imaging    CT head without and with contrast showed ill-defined enhancement and dural thickening of the left frontal resection cavity may represent a post treatment changes or residual neoplasm. A new 6 mm hypodensity in the right cerebellar hemisphere with uncertainty enhancement may represent a metastasis or small brain parenchymal hemorrhage.       01/27/2016 -  01/27/2016 Radiation Therapy    01/27/16 SRS Treatment:  20 Gy in 1 fraction to a 6 mm right cerebellar brain met       03/29/2016 PET scan    PET 03/29/2016 IMPRESSION: 1. Overall there has been an interval response to therapy. The index lesions sided on previous exam it are less hypermetabolic when compared with the previous exam. 2. There is an intramuscular lesion within the right masseter which has increased FDG uptake compared with previous exam. 3. No new sites of disease identified. 4. Aortic atherosclerosis and infrarenal abdominal aortic aneurysm. Recommend followup by ultrasound in 3 years. This recommendation follows ACR consensus guidelines: White Paper of the ACR Incidental Findings Committee II on Vascular Findings. J Joellyn Ruedadiol 2013; 10:789-794      04/20/2016 Imaging    CT Head w wo contrast 1. Mild patchy enhancement at the left anterior frontal lobe treatment site (series 11, image 39) without mass effect and stable surrounding white matter hypodensity without strong evidence of acute vasogenic edema. Recommend continued CT surveillance. 2. No new metastatic disease or new intracranial abnormality identified. 3. Probable benign 14 mm left parietal bone lucent lesion, unchanged since May 2017.      05/06/2016 - 05/08/2016 Hospital Admission    Pt was admitted for rectal bleeding for one day. Breathing spontaneously resolved, hemoglobin dropped from 15 to 10.5,  did not require blood transfusion. Colonoscopy showed no active bleeding, except diverticulosis and hemorrhoid. He was discharged to home.      05/08/2016 Procedure    Colonoscopy  - Stool in the ascending colon. Fluid aspiration performed. - Diverticulosis in the entire examined colon. - Non-bleeding internal hemorrhoids. - The examination was otherwise normal on direct and retroflexion views.      05/29/2016 Imaging    PET Image Restaging IMPRESSION: 1. Stable exam with no evidence  progression. 2. Photopenia in the LEFT frontal lobe at prior surgical site. 3. Stable mildly hypermetabolic RIGHT paratracheal node. This may represent a thyroid nodule. 4. Stable size of minimally metabolic lingular pulmonary nodule. 5. Stable skeletal lesion of the RIGHT iliac crest 6. No cutaneous metabolic lesion.      07/20/2016 Imaging    CT Head W WO Contrast IMPRESSION: Pronounced change in the left frontal region. Much more regional vasogenic edema. Relatively stable appearance of the abnormal enhancement extending from the lateral margin of the frontal horn of left lateral ventricle to the frontal brain surface. New region of abnormal enhancement surrounding the frontal horn of the left lateral ventricle measuring approximately 3.2 cm. The differential diagnosis is recurrent tumor versus radiation necrosis. The patient is reportedly not an MRI candidate. This area is large enough that CT perfusion could possibly make a reasonable differentiation.       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 He was found to have left frontal hemorrhage 5.7 cm x 4.5 cm with 6 mm left to right shift.During the workup for his stroke, a left upper lung mass was noticed on the CT scan. I was called to evaluate his lung mass.  Patient has aphasia, not able to communicate much. According to his daughter, David [name], who is a OB GYN at [employer], he probably only smoked for very short period time during the college. He did have 3 skin melanoma, which was removed, last one was 2-3 years ago. No other cancer history.  CURRENT THERAPY: Nivolumab 240 mg, every 2 weeks, started on 10/06/2015  INTERVAL HISTORY: David [name] returns for follow-up and nivolumab therapy. The patient reports he is doing well. 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 He was previously forgetting things easily, and now reports he is "much better off." He denies headaches. The patient spent time in the sun recently on a trip to [place], and reports a mild rash on his bilateral arms. He reports this is mildly tender. The patient reports no new health concerns. The patient continues to take calcium and Vitamin D daily as instructed.   REVIEW OF SYSTEMS:  Constitutional: Denies fevers, chills or abnormal weight loss, (+) fatigue Eyes: Denies blurriness of vision Ears, nose, mouth, throat, and face: Denies mucositis or sore throat Respiratory: Denies cough, dyspnea or wheezes Cardiovascular: Denies palpitation, chest discomfort or lower extremity swelling Gastrointestinal:  Denies nausea, heartburn, hematochezia (+) mild constipation from iron pill. Skin: (+) mild rash on bilateral upper extremities which is mildly tender Lymphatics: Denies new lymphadenopathy or easy bruising Neurological:see HPI  Behavioral/Psych: Mood is stable, no new changes  All other systems were reviewed with the patient and are negative.  MEDICAL HISTORY:  Past Medical History:  Diagnosis Date  . Anginal pain ([place])   . Arthritis  mostly hands  . Benign familial hematuria   . BPH (benign prostatic hypertrophy)   . Brain cancer ([place])    melanoma with brain met  . Coronary artery disease   . GERD (gastroesophageal reflux disease)   . High cholesterol   . Hypertension   . Hypothyroidism   . Intracerebral hemorrhage ([place])   . Melanoma ([place])   . Metastatic melanoma to lung ([place])   . Mood disorder ([place])   . Myocardial infarction ([place])   . Pacemaker    due to syncope, 3rd degree HB (upgrade to [facility]. Jude CRT-P 02/25/13 (Dr. Uvaldo Rising)  . Rectal bleed    due to NSAIDS  . Renal disorder   . Skin cancer    melanoma  . Sleep apnea    uses CPAP  . Stroke [facility])     SURGICAL HISTORY: Past Surgical  History:  Procedure Laterality Date  . APPLICATION OF CRANIAL NAVIGATION N/A 10/15/2015   Procedure: APPLICATION OF CRANIAL NAVIGATION;  Surgeon: Erline Levine, MD;  [facility];  Service: Neurosurgery;  Laterality: N/A;  . CARDIAC CATHETERIZATION  2013  . CARDIAC SURGERY     bypass X 2  . COLONOSCOPY WITH PROPOFOL N/A 05/08/2016   Procedure: COLONOSCOPY WITH PROPOFOL;  Surgeon: Jonathon Bellows, MD;  Location: [facility]OSCOPY;  Service: Gastroenterology;  Laterality: N/A;  . CORONARY ARTERY BYPASS GRAFT  2013   LIMA-LAD, SVG-PDA 03/27/11 (Dr. Francee Gentile)  . CRANIOTOMY N/A 10/15/2015   Procedure: LEFT FRONTAL CRANIOTOMY TUMOR EXCISION with Curve;  Surgeon: Erline Levine, MD;  Location: [facility];  Service: Neurosurgery;  Laterality: N/A;  CRANIOTOMY TUMOR EXCISION  . EYE SURGERY    . FOOT SURGERY  08/2010  . JOINT REPLACEMENT Left    partial knee  . KNEE ARTHROSCOPY  12/2008  . LYMPH NODE BIOPSY    . PACEMAKER INSERTION    . TONSILLECTOMY      I have reviewed the social history and family history with the patient and they are unchanged from previous note.  ALLERGIES:  is allergic to zocor [simvastatin].  MEDICATIONS:  Current Outpatient Prescriptions  Medication Sig Dispense Refill  . acetaminophen (TYLENOL) 325 MG tablet Take 2 tablets (650 mg total) by mouth every 6 (six) hours as needed for mild pain. (Patient taking differently: Take 650 mg by mouth 2 (two) times daily. )    . amLODipine (NORVASC) 2.5 MG tablet Take 2.5 mg by mouth daily.    . ASPIRIN LOW DOSE 81 MG EC tablet TK 1 T PO QD  11  . carvedilol (COREG) 6.25 MG tablet Take 6.25 mg by mouth 2 (two) times daily with a meal.    . ferrous sulfate 325 (65 FE) MG tablet Take 1 tablet (325 mg total) by mouth daily with breakfast. 30 tablet 0  . levothyroxine (SYNTHROID, LEVOTHROID) 125 MCG tablet Take 1 tablet (125 mcg total) by mouth daily before breakfast. 39 tablet 9  . Multiple Vitamin (MULTIVITAMIN WITH MINERALS) TABS  tablet Take 1 tablet by mouth daily.    . pentoxifylline (TRENTAL) 400 MG CR tablet Take 1 tablet (400 mg total) by mouth 2 (two) times daily. 60 tablet 6  . PRESCRIPTION MEDICATION Inhale into the lungs at bedtime. CPAP    . rosuvastatin (CRESTOR) 10 MG tablet Take 10 mg by mouth at bedtime.    . sertraline (ZOLOFT) 50 MG tablet     . testosterone cypionate (DEPOTESTOTERONE CYPIONATE) 100 MG/ML injection Inject 100 mg into the muscle every 14 (  fourteen) days.     . vitamin E (VITAMIN E) 400 UNIT capsule Take 1 capsule (400 Units total) by mouth 2 (two) times daily. 60 capsule 6   No current facility-administered medications for this visit.    Facility-Administered Medications Ordered in Other Visits  Medication Dose Route Frequency Provider Last Rate Last Dose  . 0.9 %  sodium chloride infusion  1,000 mL Intravenous Once [name], MD        PHYSICAL EXAMINATION: ECOG PERFORMANCE STATUS: 1  Vitals:   08/10/16 1220  BP: 134/85  Pulse: 74  Resp: 18  Temp: 98.3 F (36.8 C)   Filed Weights   08/10/16 1220  Weight: 202 lb 11.2 oz (91.9 kg)    GENERAL:alert, no distress and comfortable SKIN: skin color, texture, turgor are normal, (+) diffuse papular skin rash on bilateral forearms and hand  EYES: normal, Conjunctiva are pink and non-injected, sclera clear OROPHARYNX:no exudate, no erythema and lips, buccal mucosa, and tongue normal  NECK: supple, thyroid normal size, non-tender, without nodularity LYMPH:  no palpable lymphadenopathy in the cervical, [place], axillary or inguinal LUNGS: clear to auscultation and percussion with normal breathing effort HEART: regular rate & rhythm and no murmurs and no lower extremity edema ABDOMEN:abdomen soft, non-tender and normal bowel sounds Musculoskeletal:no cyanosis of digits and no clubbing  NEURO: alert & oriented x 3 with fluent speech, no focal motor/sensory deficits, he walks independently, gait is normal   LABORATORY DATA:  I have  reviewed the data as listed CBC Latest Ref Rng & Units 08/10/2016 07/12/2016 06/28/2016  WBC 4.0 - 10.3 10e3/uL 6.7 5.2 4.3  Hemoglobin 13.0 - 17.1 g/dL 16.8 15.7 15.2  Hematocrit 38.4 - 49.9 % 48.6 46.4 44.4  Platelets 140 - 400 10e3/uL 95(L) 147 177     CMP Latest Ref Rng & Units 08/10/2016 07/12/2016 06/28/2016  Glucose 70 - 140 mg/dl 79 149(H) 95  BUN 7.0 - 26.0 mg/dL 48.6(H) 33.1(H) 28.8(H)  Creatinine 0.7 - 1.3 mg/dL 1.4(H) 1.4(H) 1.4(H)  Sodium 136 - 145 mEq/L 138 139 140  Potassium 3.5 - 5.1 mEq/L 4.6 4.5 4.6  Chloride 101 - 111 mmol/L - - -  CO2 22 - 29 mEq/L _0 Calcium 8.4 - 10.4 mg/dL 9.0 9.8 9.7  Total Protein 6.4 - 8.3 g/dL 6.1(L) 6.9 6.7  Total Bilirubin 0.20 - 1.20 mg/dL 0.48 0.44 0.44  Alkaline Phos 40 - 150 U/L 47 47 54  AST 5 - 34 U/L _1 ALT 0 - 55 U/L _2 PATHOLOGY REPORT: Diagnosis 09/21/2015 Lymph node, needle/core biopsy, hypermetabolic left sub clavicular LN - METASTATIC MALIGNANT MELANOMA. - SEE COMMENT.  Diagnosis 10/15/2015 Brain, for tumor resection, Left frontal - METASTATIC MELANOMA.     PROCEDURES: Colonoscopy 05/08/16 - Stool in the ascending colon. Fluid aspiration performed. - Diverticulosis in the entire examined colon. - Non-bleeding internal hemorrhoids. - The examination was otherwise normal on direct and retroflexion views.  RADIOGRAPHIC STUDIES: I have personally reviewed the radiological images as listed and agreed with the findings in the report. No results found.   CT Head W WO Contrast 2016/08/02 IMPRESSION: Pronounced change in the left frontal region. Much more regional vasogenic edema. Relatively stable appearance of the abnormal enhancement extending from the lateral margin of the frontal horn of left lateral ventricle to the frontal brain surface. New region of abnormal enhancement surrounding the frontal horn of the left lateral ventricle measuring approximately 3.2 cm. The differential diagnosis  is recurrent  tumor versus radiation necrosis. The patient is reportedly not an MRI candidate. This area is large enough that CT perfusion could possibly make a reasonable differentiation.  PET Image Restage 05/29/16 IMPRESSION: 1. Stable exam with no evidence progression. 2. Photopenia in the LEFT frontal lobe at prior surgical site. 3. Stable mildly hypermetabolic RIGHT paratracheal node. This may represent a thyroid nodule. 4. Stable size of minimally metabolic lingular pulmonary nodule. 5. Stable skeletal lesion of the RIGHT iliac crest 6. No cutaneous metabolic lesion.   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   1. Metastatic melanoma to lung, node, bone and brain, BRAF V600K mutation (+) -I previously  reviewed his PET/CT scan images with pt and his family members. The scan revealed hypermetabolic left upper lobe lung mass, 2 small right lung nodules, probable bone metastasis, and a subcutaneous left supraclavicular lesion, which is not palpable  -His repeat CT had showed a 2.0 cm left frontal hyperdense lesion with enhancement on contrast, in the area of his recent hemorrhagic stroke, highly suspicious for brain metastasis.  -We previously discussed his left supraclavicular node biopsy results, which showed metastatic melanoma.. -His case was discussed in our sarcoma tumor board. -I previously recommend a first-line treatment with Nivolumab alone, given his recent stroke, low tumor burden. I'll reserve BRAF inhibitor with or without MEK inhibitor as a second line therapy if he progress on immunotherapy. Other options of immunotherapy, especially checkpoint inhibitor with ipilumimab, were also discussed with pt and his daughter -He has been tolerating Nivolumab well, no significant side effects except fatigue We'll continue -He is now status post SRS and  surgical resection to his solitary brain metastasis on 10/15/15. -I previously reviewed his recent PET scan findings from 05/29/16, which showed stable disease in lung, node and bone. His previous hypermetabolic uptake in the right master muscle has resolved, no other new lesions  -We previously discussed the FDA has recently approved that Nivolumab could be given every 4 weeks, but at a higher dose. However, insurance has not approved this yet. Once insurance approves this treatment option, then he could consider this. -He is clinically doing very well, tolerating Nivolumab without any significant side effects. -We'll continue Xgeva monthly for his bone metastasis. - The patient is feeling much better since beginning dexamethasone; he is following taper instructions per radiation oncology, he will complete tomorrow. Nivo was held 2 weeks ago due to steroids usage.  -Lab results reviewed, adequate for treatment, we'll continue nivolumab. - Restaging scans in June. I will order this at next follow up.   2. Hemorrhagic stroke and brain metastasis  -I encouraged him to continue physical therapy and occupational therapy. He has recovered very well -History post SRS and surgical resection of his solitary brain metastasis  -His neurology deficit has much improved -He recently received a course of dexamethasone for radionecrosis  3. Fatigue, hypothyroidism and hypotestosteronemia -He has baseline hypothyroidism and hypotestosteronemia -His fatigue has much improved since he restarted testosterone injection by his primary care physician -He will follow-up with his primary care physician Dr. Asher Muir for his Synthroid dose adjustment  -His TSH was slightly In November 2017. TSH 9.6 on 06/15/16. - TSH 25.01 on 08/10/16. -The patient will continue with light exercise as he is able.  4. Anemia and history of GI bleeding  -his anemia has resolved now -I encouraged him to continue oral  iron supplement for a total  of 3 months   5. HTN -he is on amlodipine and carvedilol, follow-up of his primary care physician  6. Depression  -he is on zoloft 3m daily, management by his PCP  -improved overall   7. CKD stage III -His Cr has been slightly elevated -I encouraged him to drink water adequately -avoid NSAIDs  8. Goal of care discussion  -We again discussed the incurable nature of his metastatic melanoma, and the overall poor prognosis, especially if he does not have good response to chemotherapy or progress on chemo -The patient understands the goal of care is palliative. -I recommend DNR/DNI, he will think about it   9. Skin Rash - [place] a [place]. - I advised the patient to use sunscreen and wear protective clothing when out in the sun in the future. He is in agreement.  Plan - Labs reviewed, adequate to continue Nivolumab treatment today and every 2, 4, and 6 weeks. - Continue taking calcium and vitamin D. - Follow up in 4 weeks. - Repeat scans in June, order on next visit   All questions were answered. The patient knows to call the clinic with any problems, questions or concerns. No barriers to learning was detected.  I spent 30 minutes counseling the patient face to face. The total time spent in the appointment was 35 minutes and more than 50% was on counseling and review of test results.  This document serves as a record of services personally performed by YTruitt Merle MD. It was created on her behalf by SMaryla Morrow a trained medical scribe. The creation of this record is based on the scribe's personal observations and the provider's statements to them. This document has been checked and approved by the attending provider.    FTruitt Merle MD 08/10/16

## 2016-08-10 ENCOUNTER — Other Ambulatory Visit (HOSPITAL_BASED_OUTPATIENT_CLINIC_OR_DEPARTMENT_OTHER): Payer: Medicare Other

## 2016-08-10 ENCOUNTER — Telehealth: Payer: Self-pay | Admitting: Hematology

## 2016-08-10 ENCOUNTER — Ambulatory Visit (HOSPITAL_BASED_OUTPATIENT_CLINIC_OR_DEPARTMENT_OTHER): Payer: Medicare Other | Admitting: Hematology

## 2016-08-10 ENCOUNTER — Ambulatory Visit (HOSPITAL_BASED_OUTPATIENT_CLINIC_OR_DEPARTMENT_OTHER): Payer: Medicare Other

## 2016-08-10 VITALS — BP 134/85 | HR 74 | Temp 98.3°F | Resp 18 | Ht 69.5 in | Wt 202.7 lb

## 2016-08-10 DIAGNOSIS — C7800 Secondary malignant neoplasm of unspecified lung: Secondary | ICD-10-CM | POA: Diagnosis not present

## 2016-08-10 DIAGNOSIS — C799 Secondary malignant neoplasm of unspecified site: Secondary | ICD-10-CM

## 2016-08-10 DIAGNOSIS — I10 Essential (primary) hypertension: Secondary | ICD-10-CM

## 2016-08-10 DIAGNOSIS — Z79899 Other long term (current) drug therapy: Secondary | ICD-10-CM

## 2016-08-10 DIAGNOSIS — R5383 Other fatigue: Secondary | ICD-10-CM | POA: Diagnosis not present

## 2016-08-10 DIAGNOSIS — C770 Secondary and unspecified malignant neoplasm of lymph nodes of head, face and neck: Secondary | ICD-10-CM | POA: Diagnosis not present

## 2016-08-10 DIAGNOSIS — Z8673 Personal history of transient ischemic attack (TIA), and cerebral infarction without residual deficits: Secondary | ICD-10-CM | POA: Diagnosis not present

## 2016-08-10 DIAGNOSIS — N183 Chronic kidney disease, stage 3 (moderate): Secondary | ICD-10-CM | POA: Diagnosis not present

## 2016-08-10 DIAGNOSIS — Z5112 Encounter for antineoplastic immunotherapy: Secondary | ICD-10-CM | POA: Diagnosis not present

## 2016-08-10 DIAGNOSIS — C439 Malignant melanoma of skin, unspecified: Secondary | ICD-10-CM | POA: Diagnosis not present

## 2016-08-10 DIAGNOSIS — E039 Hypothyroidism, unspecified: Secondary | ICD-10-CM | POA: Diagnosis not present

## 2016-08-10 DIAGNOSIS — R21 Rash and other nonspecific skin eruption: Secondary | ICD-10-CM | POA: Diagnosis not present

## 2016-08-10 DIAGNOSIS — C7931 Secondary malignant neoplasm of brain: Secondary | ICD-10-CM

## 2016-08-10 DIAGNOSIS — C7951 Secondary malignant neoplasm of bone: Secondary | ICD-10-CM | POA: Diagnosis not present

## 2016-08-10 DIAGNOSIS — F329 Major depressive disorder, single episode, unspecified: Secondary | ICD-10-CM

## 2016-08-10 DIAGNOSIS — E291 Testicular hypofunction: Secondary | ICD-10-CM | POA: Diagnosis not present

## 2016-08-10 DIAGNOSIS — I619 Nontraumatic intracerebral hemorrhage, unspecified: Secondary | ICD-10-CM

## 2016-08-10 LAB — CBC WITH DIFFERENTIAL/PLATELET
BASO%: 0 % (ref 0.0–2.0)
Basophils Absolute: 0 10*3/uL (ref 0.0–0.1)
EOS%: 3.2 % (ref 0.0–7.0)
Eosinophils Absolute: 0.2 10*3/uL (ref 0.0–0.5)
HEMATOCRIT: 48.6 % (ref 38.4–49.9)
HEMOGLOBIN: 16.8 g/dL (ref 13.0–17.1)
LYMPH#: 0.6 10*3/uL — AB (ref 0.9–3.3)
LYMPH%: 9 % — AB (ref 14.0–49.0)
MCH: 32.2 pg (ref 27.2–33.4)
MCHC: 34.6 g/dL (ref 32.0–36.0)
MCV: 93.1 fL (ref 79.3–98.0)
MONO#: 0.3 10*3/uL (ref 0.1–0.9)
MONO%: 4.2 % (ref 0.0–14.0)
NEUT%: 83.6 % — ABNORMAL HIGH (ref 39.0–75.0)
NEUTROS ABS: 5.6 10*3/uL (ref 1.5–6.5)
NRBC: 0 % (ref 0–0)
Platelets: 95 10*3/uL — ABNORMAL LOW (ref 140–400)
RBC: 5.22 10*6/uL (ref 4.20–5.82)
RDW: 13.9 % (ref 11.0–14.6)
WBC: 6.7 10*3/uL (ref 4.0–10.3)

## 2016-08-10 LAB — COMPREHENSIVE METABOLIC PANEL
ALT: 23 U/L (ref 0–55)
AST: 19 U/L (ref 5–34)
Albumin: 3.3 g/dL — ABNORMAL LOW (ref 3.5–5.0)
Alkaline Phosphatase: 47 U/L (ref 40–150)
Anion Gap: 8 mEq/L (ref 3–11)
BUN: 48.6 mg/dL — AB (ref 7.0–26.0)
CALCIUM: 9 mg/dL (ref 8.4–10.4)
CO2: 24 mEq/L (ref 22–29)
CREATININE: 1.4 mg/dL — AB (ref 0.7–1.3)
Chloride: 106 mEq/L (ref 98–109)
EGFR: 50 mL/min/{1.73_m2} — ABNORMAL LOW (ref >90)
Glucose: 79 mg/dl (ref 70–140)
Potassium: 4.6 mEq/L (ref 3.5–5.1)
Sodium: 138 mEq/L (ref 136–145)
TOTAL PROTEIN: 6.1 g/dL — AB (ref 6.4–8.3)
Total Bilirubin: 0.48 mg/dL (ref 0.20–1.20)

## 2016-08-10 LAB — TSH: TSH: 25.011 m[IU]/L — AB (ref 0.320–4.118)

## 2016-08-10 MED ORDER — SODIUM CHLORIDE 0.9 % IV SOLN
Freq: Once | INTRAVENOUS | Status: AC
Start: 2016-08-10 — End: 2016-08-10
  Administered 2016-08-10: 13:00:00 via INTRAVENOUS

## 2016-08-10 MED ORDER — SODIUM CHLORIDE 0.9 % IV SOLN
240.0000 mg | Freq: Once | INTRAVENOUS | Status: AC
  Administered 2016-08-10: 240 mg via INTRAVENOUS
  Filled 2016-08-10: qty 24

## 2016-08-10 MED ORDER — DENOSUMAB 120 MG/1.7ML ~~LOC~~ SOLN
120.0000 mg | Freq: Once | SUBCUTANEOUS | Status: AC
  Administered 2016-08-10: 120 mg via SUBCUTANEOUS
  Filled 2016-08-10: qty 1.7

## 2016-08-10 NOTE — Telephone Encounter (Signed)
Appointments scheduled per 08/10/16 los.  Patient was given a copy of the AVS report and appointment schedule per 08/10/16 los.

## 2016-08-10 NOTE — Patient Instructions (Signed)
[facility] Discharge Instructions for Patients Receiving Chemotherapy  Today you received the following chemotherapy agents Opdivo   To help prevent nausea and vomiting after your treatment, we encourage you to take your nausea medication as directed.    If you develop nausea and vomiting that is not controlled by your nausea medication, call the clinic.   BELOW ARE SYMPTOMS THAT SHOULD BE REPORTED IMMEDIATELY:  *FEVER GREATER THAN 100.5 F  *CHILLS WITH OR WITHOUT FEVER  NAUSEA AND VOMITING THAT IS NOT CONTROLLED WITH YOUR NAUSEA MEDICATION  *UNUSUAL SHORTNESS OF BREATH  *UNUSUAL BRUISING OR BLEEDING  TENDERNESS IN MOUTH AND THROAT WITH OR WITHOUT PRESENCE OF ULCERS  *URINARY PROBLEMS  *BOWEL PROBLEMS  UNUSUAL RASH Items with * indicate a potential emergency and should be followed up as soon as possible.  Feel free to call the clinic you have any questions or concerns. The clinic phone number is [phone].  Please show the [place] at check-in to the Emergency Department and triage nurse.

## 2016-08-10 NOTE — Progress Notes (Signed)
Platelets 95. Okay to proceed with treatment today.

## 2016-08-12 ENCOUNTER — Encounter: Payer: Self-pay | Admitting: Hematology

## 2016-08-14 DIAGNOSIS — E291 Testicular hypofunction: Secondary | ICD-10-CM | POA: Diagnosis not present

## 2016-08-15 ENCOUNTER — Telehealth: Payer: Self-pay | Admitting: *Deleted

## 2016-08-15 NOTE — Telephone Encounter (Signed)
-----   Message from [name], MD sent at 08/13/2016  8:40 PM EDT ----- Please inform pt the TSH result, and fax the report to his PCP, thanks.  [name]  08/13/2016

## 2016-08-15 NOTE — Telephone Encounter (Signed)
Spoke with pt and informed pt of TSH result.   Faxed result to Dr. [name], PCP as well.  Instructed pt to follow up with his PCP if he has not heard from their office soon.  Pt voiced understanding. Dr. [name]     Fax     [phone]     ;     Phone      [phone].

## 2016-08-16 DIAGNOSIS — F329 Major depressive disorder, single episode, unspecified: Secondary | ICD-10-CM | POA: Diagnosis not present

## 2016-08-16 DIAGNOSIS — E039 Hypothyroidism, unspecified: Secondary | ICD-10-CM | POA: Diagnosis not present

## 2016-08-16 DIAGNOSIS — Z79899 Other long term (current) drug therapy: Secondary | ICD-10-CM | POA: Diagnosis not present

## 2016-08-16 DIAGNOSIS — C7989 Secondary malignant neoplasm of other specified sites: Secondary | ICD-10-CM | POA: Diagnosis not present

## 2016-08-18 ENCOUNTER — Telehealth: Payer: Self-pay | Admitting: Hematology

## 2016-08-18 NOTE — Telephone Encounter (Signed)
High priority schedule message sent to request his 5/16 appointment   [name] MD

## 2016-08-18 NOTE — Telephone Encounter (Signed)
Patient called and said that he was not able to make his appointment on 5/16 (lab and infusion) if he could move it to Thursday that would be good.  I told the patient I was not sure if we could move him to Thursday but I would follow up as to what needed to be done if we could not move him.  He said either was he was not able to make his appointment on 5/16

## 2016-08-22 NOTE — Telephone Encounter (Signed)
Patient called and said to leave his 5/16 appointment and he would be able to make it as of now 11:41 am he has labs at 9am and an infusion at 10 on 5/16.  PLEASE DO NOT CHANGE

## 2016-08-23 ENCOUNTER — Ambulatory Visit (HOSPITAL_BASED_OUTPATIENT_CLINIC_OR_DEPARTMENT_OTHER): Payer: Medicare Other

## 2016-08-23 ENCOUNTER — Other Ambulatory Visit (HOSPITAL_BASED_OUTPATIENT_CLINIC_OR_DEPARTMENT_OTHER): Payer: Medicare Other

## 2016-08-23 VITALS — BP 128/91 | HR 75 | Resp 20

## 2016-08-23 DIAGNOSIS — C770 Secondary and unspecified malignant neoplasm of lymph nodes of head, face and neck: Secondary | ICD-10-CM

## 2016-08-23 DIAGNOSIS — C439 Malignant melanoma of skin, unspecified: Secondary | ICD-10-CM

## 2016-08-23 DIAGNOSIS — C7951 Secondary malignant neoplasm of bone: Secondary | ICD-10-CM

## 2016-08-23 DIAGNOSIS — C7931 Secondary malignant neoplasm of brain: Secondary | ICD-10-CM | POA: Diagnosis not present

## 2016-08-23 DIAGNOSIS — C799 Secondary malignant neoplasm of unspecified site: Secondary | ICD-10-CM

## 2016-08-23 DIAGNOSIS — Z5111 Encounter for antineoplastic chemotherapy: Secondary | ICD-10-CM | POA: Diagnosis not present

## 2016-08-23 DIAGNOSIS — Z79899 Other long term (current) drug therapy: Secondary | ICD-10-CM

## 2016-08-23 LAB — COMPREHENSIVE METABOLIC PANEL
ALBUMIN: 3.5 g/dL (ref 3.5–5.0)
ALK PHOS: 59 U/L (ref 40–150)
ALT: 19 U/L (ref 0–55)
ANION GAP: 12 meq/L — AB (ref 3–11)
AST: 22 U/L (ref 5–34)
BILIRUBIN TOTAL: 0.44 mg/dL (ref 0.20–1.20)
BUN: 32.3 mg/dL — ABNORMAL HIGH (ref 7.0–26.0)
CO2: 23 mEq/L (ref 22–29)
Calcium: 10.7 mg/dL — ABNORMAL HIGH (ref 8.4–10.4)
Chloride: 106 mEq/L (ref 98–109)
Creatinine: 1.3 mg/dL (ref 0.7–1.3)
EGFR: 53 mL/min/{1.73_m2} — AB (ref >90)
Glucose: 161 mg/dl — ABNORMAL HIGH (ref 70–140)
Potassium: 4.9 mEq/L (ref 3.5–5.1)
Sodium: 140 mEq/L (ref 136–145)
TOTAL PROTEIN: 7.1 g/dL (ref 6.4–8.3)

## 2016-08-23 LAB — CBC WITH DIFFERENTIAL/PLATELET
BASO%: 0.6 % (ref 0.0–2.0)
BASOS ABS: 0 10*3/uL (ref 0.0–0.1)
EOS ABS: 0.1 10*3/uL (ref 0.0–0.5)
EOS%: 3.1 % (ref 0.0–7.0)
HCT: 50.5 % — ABNORMAL HIGH (ref 38.4–49.9)
HGB: 17 g/dL (ref 13.0–17.1)
LYMPH%: 9.8 % — AB (ref 14.0–49.0)
MCH: 31.9 pg (ref 27.2–33.4)
MCHC: 33.6 g/dL (ref 32.0–36.0)
MCV: 94.9 fL (ref 79.3–98.0)
MONO#: 0.4 10*3/uL (ref 0.1–0.9)
MONO%: 10.4 % (ref 0.0–14.0)
NEUT%: 76.1 % — AB (ref 39.0–75.0)
NEUTROS ABS: 3 10*3/uL (ref 1.5–6.5)
PLATELETS: 213 10*3/uL (ref 140–400)
RBC: 5.32 10*6/uL (ref 4.20–5.82)
RDW: 14.1 % (ref 11.0–14.6)
WBC: 3.9 10*3/uL — AB (ref 4.0–10.3)
lymph#: 0.4 10*3/uL — ABNORMAL LOW (ref 0.9–3.3)

## 2016-08-23 LAB — TSH: TSH: 13.758 m(IU)/L — ABNORMAL HIGH (ref 0.320–4.118)

## 2016-08-23 MED ORDER — SODIUM CHLORIDE 0.9 % IV SOLN
240.0000 mg | Freq: Once | INTRAVENOUS | Status: AC
  Administered 2016-08-23: 240 mg via INTRAVENOUS
  Filled 2016-08-23: qty 24

## 2016-08-23 MED ORDER — SODIUM CHLORIDE 0.9 % IV SOLN
Freq: Once | INTRAVENOUS | Status: AC
  Administered 2016-08-23: 11:00:00 via INTRAVENOUS

## 2016-08-23 MED ORDER — PALONOSETRON HCL INJECTION 0.25 MG/5ML
INTRAVENOUS | Status: AC
  Filled 2016-08-23: qty 5

## 2016-08-23 NOTE — Patient Instructions (Signed)
[facility] Discharge Instructions for Patients Receiving Chemotherapy  Today you received the following chemotherapy agents Nivolumab  To help prevent nausea and vomiting after your treatment, we encourage you to take your nausea medication     If you develop nausea and vomiting that is not controlled by your nausea medication, call the clinic.   BELOW ARE SYMPTOMS THAT SHOULD BE REPORTED IMMEDIATELY:  *FEVER GREATER THAN 100.5 F  *CHILLS WITH OR WITHOUT FEVER  NAUSEA AND VOMITING THAT IS NOT CONTROLLED WITH YOUR NAUSEA MEDICATION  *UNUSUAL SHORTNESS OF BREATH  *UNUSUAL BRUISING OR BLEEDING  TENDERNESS IN MOUTH AND THROAT WITH OR WITHOUT PRESENCE OF ULCERS  *URINARY PROBLEMS  *BOWEL PROBLEMS  UNUSUAL RASH Items with * indicate a potential emergency and should be followed up as soon as possible.  Feel free to call the clinic you have any questions or concerns. The clinic phone number is [phone].  Please show the CHEMO ALERT CARD at check-in to the Emergency Department and triage nurse.   

## 2016-08-23 NOTE — Progress Notes (Signed)
Ok to treat pt with nivolumab and do not wait for lab results before Nivolumab.

## 2016-08-25 ENCOUNTER — Other Ambulatory Visit: Payer: Self-pay | Admitting: Radiation Therapy

## 2016-08-25 DIAGNOSIS — C7931 Secondary malignant neoplasm of brain: Secondary | ICD-10-CM

## 2016-08-28 DIAGNOSIS — R531 Weakness: Secondary | ICD-10-CM | POA: Diagnosis not present

## 2016-08-28 DIAGNOSIS — E039 Hypothyroidism, unspecified: Secondary | ICD-10-CM | POA: Diagnosis not present

## 2016-08-31 NOTE — Progress Notes (Signed)
[place]  [phone] Fax:([phone]) [phone]  Clinic Follow up Note   Patient Care Team: [name], MD as PCP - General (Internal Medicine) 09/06/2016  Chief complaint: Follow-up metastatic melanoma  Oncology History   Metastatic melanoma [facility])   Staging form: Melanoma of the Skin, AJCC 7th Edition     Clinical stage from 09/21/2015: Stage IV [employer] [place]) - Signed by [name], MD on 10/09/2015       Metastatic melanoma ([place])   09/16/2015 Imaging    PET scan showed a hypermetabolic 3.5TI lingular nodule, and a 30m right upper lobe lung nodule, left subclavicular nodes, and bone metastasis in the proximal right humerus and right femur.       09/21/2015 Initial Diagnosis    Metastatic melanoma ([place]      09/21/2015 Initial Biopsy    Left subclavicular lymph node biopsy showed prostatic of melanoma      09/21/2015 Miscellaneous    BRAF V600K mutation (+)       10/04/2015 Imaging    CT head with without contrast showed a 2.0 x 1.6 x 1.7 cm superficial left frontal hyperdense lesion, with postcontrast enhancement, lying within the previous hemorrhagic area, concerning for solitary melanoma metastasis      10/06/2015 -  Chemotherapy    Nivolumab [id number] 2 weeks       10/13/2015 - 10/13/2015 Radiation Therapy    10/13/2015 Preop SRS treatment: 14 Gy  to the left frontal lesion in 1 fraction      10/15/2015 Surgery    left front craniotomy and tumor excision       10/15/2015 Pathology Results    left frontal brain mass excision showed metastatic melanoma, 1.5X1.5X0.6cm      01/13/2016 Imaging    CT head without and with contrast showed ill-defined enhancement and dural thickening of the left frontal resection cavity may represent a post treatment changes or residual neoplasm. A new 6 mm hypodensity in the right cerebellar hemisphere with uncertainty enhancement may represent a metastasis or small brain parenchymal hemorrhage.       01/27/2016 -  01/27/2016 Radiation Therapy    01/27/16 SRS Treatment:  20 Gy in 1 fraction to a 6 mm right cerebellar brain met       03/29/2016 PET scan    PET 03/29/2016 IMPRESSION: 1. Overall there has been an interval response to therapy. The index lesions sided on previous exam it are less hypermetabolic when compared with the previous exam. 2. There is an intramuscular lesion within the right masseter which has increased FDG uptake compared with previous exam. 3. No new sites of disease identified. 4. Aortic atherosclerosis and infrarenal abdominal aortic aneurysm. Recommend followup by ultrasound in 3 years. This recommendation follows ACR consensus guidelines: White Paper of the ACR Incidental Findings Committee II on Vascular Findings. J Joellyn Ruedadiol 2013; 10:789-794      04/20/2016 Imaging    CT Head w wo contrast 1. Mild patchy enhancement at the left anterior frontal lobe treatment site (series 11, image 39) without mass effect and stable surrounding white matter hypodensity without strong evidence of acute vasogenic edema. Recommend continued CT surveillance. 2. No new metastatic disease or new intracranial abnormality identified. 3. Probable benign 14 mm left parietal bone lucent lesion, unchanged since May 2017.      05/06/2016 - 05/08/2016 Hospital Admission    Pt was admitted for rectal bleeding for one day. Breathing spontaneously resolved, hemoglobin dropped from 15 to 10.5,  did not require blood transfusion. Colonoscopy showed no active bleeding, except diverticulosis and hemorrhoid. He was discharged to home.      05/08/2016 Procedure    Colonoscopy  - Stool in the ascending colon. Fluid aspiration performed. - Diverticulosis in the entire examined colon. - Non-bleeding internal hemorrhoids. - The examination was otherwise normal on direct and retroflexion views.      05/29/2016 Imaging    PET Image Restaging IMPRESSION: 1. Stable exam with no evidence  progression. 2. Photopenia in the LEFT frontal lobe at prior surgical site. 3. Stable mildly hypermetabolic RIGHT paratracheal node. This may represent a thyroid nodule. 4. Stable size of minimally metabolic lingular pulmonary nodule. 5. Stable skeletal lesion of the RIGHT iliac crest 6. No cutaneous metabolic lesion.      07/20/2016 Imaging    CT Head W WO Contrast IMPRESSION: Pronounced change in the left frontal region. Much more regional vasogenic edema. Relatively stable appearance of the abnormal enhancement extending from the lateral margin of the frontal horn of left lateral ventricle to the frontal brain surface. New region of abnormal enhancement surrounding the frontal horn of the left lateral ventricle measuring approximately 3.2 cm. The differential diagnosis is recurrent tumor versus radiation necrosis. The patient is reportedly not an MRI candidate. This area is large enough that CT perfusion could possibly make a reasonable differentiation.      09/02/2016 Imaging    CT Head 09/02/16 IMPRESSION: Somewhat mixed pattern of slight posterior progression of pleomorphic enhancement surrounding the LEFT lateral ventricle although no significant mass effect, and reduced vasogenic edema compared with the scan in April.       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 He was found to have left frontal hemorrhage 5.7 cm x 4.5 cm with 6 mm left to right shift.During the workup for his stroke, a left upper lung mass was noticed on the CT scan. I was called to evaluate his lung mass.  Patient has aphasia, not able to communicate much. According to his daughter, Dr. [name], who is a OB GYN at [employer], he probably only smoked for very short period time during the college. He did have 3 skin melanoma, which was removed, last one was 2-3 years ago. No  other cancer history.  CURRENT THERAPY: Nivolumab 240 mg, every 2 weeks, started on 10/06/2015  INTERVAL HISTORY: [name] returns for follow-up and nivolumab therapy. He presents to the clinic today by himself. He developed worsening aphasia, memory loss and cognitive difficulty in the past week, and was evaluated in ED on 5/27. CT head showed slightly worsen radionecrosis, and he was started on dexa 62m bid. He has felt better since then, able to complete sentence, but still has difficulty with memory and cognitive functions. He did drive to our clinic by himself, reports has no issue with balance, no other new complains.     REVIEW OF SYSTEMS:  Constitutional: Denies fevers, chills or abnormal weight loss, (+) fatigue (+) confusion (+) head pain  Eyes: Denies blurriness of vision Ears, nose, mouth, throat, and face: Denies mucositis or sore throat Respiratory: Denies cough, dyspnea or wheezes Cardiovascular: Denies palpitation, chest discomfort or lower extremity swelling Gastrointestinal:  Denies nausea, heartburn, hematochezia (+) mild constipation from iron pill. Skin: (+) mild rash on bilateral upper extremities which is mildly tender Lymphatics: Denies new lymphadenopathy or easy bruising Neurological:see HPI  Behavioral/Psych: Mood is stable, no new changes  All other systems were reviewed with the patient and are negative.  MEDICAL HISTORY:  Past Medical History:  Diagnosis Date  . Anginal pain ([place])   . Arthritis    mostly hands  . Benign familial hematuria   . BPH (benign prostatic hypertrophy)   . Brain cancer ([place])    melanoma with brain met  . Coronary artery disease   . GERD (gastroesophageal reflux disease)   . High cholesterol   . Hypertension   . Hypothyroidism   . Intracerebral hemorrhage ([place])   . Melanoma ([place])   . Metastatic melanoma to lung ([place])   . Mood disorder ([place])   . Myocardial infarction ([place])   . Pacemaker    due to syncope, 3rd degree HB  (upgrade to [facility]. Jude CRT-P 02/25/13 (Dr. Uvaldo Rising)  . Rectal bleed    due to NSAIDS  . Renal disorder   . Skin cancer    melanoma  . Sleep apnea    uses CPAP  . Stroke [facility])     SURGICAL HISTORY: Past Surgical History:  Procedure Laterality Date  . APPLICATION OF CRANIAL NAVIGATION N/A 10/15/2015   Procedure: APPLICATION OF CRANIAL NAVIGATION;  Surgeon: Erline Levine, MD;  Location: [place] NEURO ORS;  Service: Neurosurgery;  Laterality: N/A;  . CARDIAC CATHETERIZATION  2013  . CARDIAC SURGERY     bypass X 2  . COLONOSCOPY WITH PROPOFOL N/A 05/08/2016   Procedure: COLONOSCOPY WITH PROPOFOL;  Surgeon: Jonathon Bellows, MD;  Location: [facility] ENDOSCOPY;  Service: Gastroenterology;  Laterality: N/A;  . CORONARY ARTERY BYPASS GRAFT  2013   LIMA-LAD, SVG-PDA 03/27/11 (Dr. Francee Gentile)  . CRANIOTOMY N/A 10/15/2015   Procedure: LEFT FRONTAL CRANIOTOMY TUMOR EXCISION with Curve;  Surgeon: Erline Levine, MD;  Location: [place] NEURO ORS;  Service: Neurosurgery;  Laterality: N/A;  CRANIOTOMY TUMOR EXCISION  . EYE SURGERY    . FOOT SURGERY  08/2010  . JOINT REPLACEMENT Left    partial knee  . KNEE ARTHROSCOPY  12/2008  . LYMPH NODE BIOPSY    . PACEMAKER INSERTION    . TONSILLECTOMY      I have reviewed the social history and family history with the patient and they are unchanged from previous note.  ALLERGIES:  is allergic to zocor [simvastatin].  MEDICATIONS:  Current Outpatient Prescriptions  Medication Sig Dispense Refill  . acetaminophen (TYLENOL) 325 MG tablet Take 2 tablets (650 mg total) by mouth every 6 (six) hours as needed for mild pain. (Patient taking differently: Take 650 mg by mouth 2 (two) times daily. )    . amLODipine (NORVASC) 2.5 MG tablet Take 2.5 mg by mouth daily.    . ASPIRIN LOW DOSE 81 MG EC tablet Take 81 mg by mouth daily  11  . carvedilol (COREG) 6.25 MG tablet Take 6.25 mg by mouth 2 (two) times daily with a meal.    . dexamethasone (DECADRON) 2 MG tablet Take 1 tablet (2 mg total) by  mouth 2 (two) times daily. 10 tablet 0  . ferrous sulfate 325 (65 FE) MG tablet Take 1 tablet (325 mg total) by mouth daily with breakfast. 30 tablet 0  . levothyroxine (SYNTHROID, LEVOTHROID) 175 MCG tablet Take 175 mcg by mouth daily before breakfast.    . Multiple Vitamin (MULTIVITAMIN WITH MINERALS) TABS tablet Take 1 tablet by mouth daily.    . pentoxifylline (TRENTAL) 400 MG CR tablet Take 1 tablet (400 mg total) by mouth 2 (two) times  daily. 60 tablet 6  . PRESCRIPTION MEDICATION Inhale into the lungs at bedtime. CPAP    . rosuvastatin (CRESTOR) 10 MG tablet Take 10 mg by mouth at bedtime.    . sertraline (ZOLOFT) 50 MG tablet Take 100 mg by mouth daily.     Marland Kitchen testosterone cypionate (DEPOTESTOTERONE CYPIONATE) 100 MG/ML injection Inject 100 mg into the muscle every 14 (fourteen) days.     . vitamin E (VITAMIN E) 400 UNIT capsule Take 1 capsule (400 Units total) by mouth 2 (two) times daily. 60 capsule 6   No current facility-administered medications for this visit.    Facility-Administered Medications Ordered in Other Visits  Medication Dose Route Frequency Provider Last Rate Last Dose  . 0.9 %  sodium chloride infusion  1,000 mL Intravenous Once [name], MD      . nivolumab (OPDIVO) 240 mg in sodium chloride 0.9 % 100 mL chemo infusion  240 mg Intravenous Once [name], MD 248 mL/hr at 09/06/16 1050 240 mg at 09/06/16 1050    PHYSICAL EXAMINATION: ECOG PERFORMANCE STATUS: 1  Vitals:   09/06/16 0920  BP: (!) 150/92  Pulse: 81  Resp: 18  Temp: 97.8 F (36.6 C)   Filed Weights   09/06/16 0920  Weight: 204 lb 8 oz (92.8 kg)    GENERAL:alert, no distress and comfortable SKIN: skin color, texture, turgor are normal, (+) diffuse papular skin rash on bilateral forearms and hand, improved EYES: normal, Conjunctiva are pink and non-injected, sclera clear OROPHARYNX:no exudate, no erythema and lips, buccal mucosa, and tongue normal  NECK: supple, thyroid normal size,  non-tender, without nodularity LYMPH:  no palpable lymphadenopathy in the cervical, [place], axillary or inguinal LUNGS: clear to auscultation and percussion with normal breathing effort HEART: regular rate & rhythm and no murmurs and no lower extremity edema ABDOMEN:abdomen soft, non-tender and normal bowel sounds Musculoskeletal:no cyanosis of digits and no clubbing  NEURO: alert & oriented x 3 with fluent speech, no focal motor/sensory deficits, he walks independently, gait is normal   LABORATORY DATA:  I have reviewed the data as listed CBC Latest Ref Rng & Units 09/06/2016 09/02/2016 09/02/2016  WBC 4.0 - 10.3 10e3/uL 7.6 - 10.0  Hemoglobin 13.0 - 17.1 g/dL 16.0 16.3 17.1(H)  Hematocrit 38.4 - 49.9 % 46.9 48.0 49.0  Platelets 140 - 400 10e3/uL 179 - 207     CMP Latest Ref Rng & Units 09/06/2016 09/02/2016 09/02/2016  Glucose 70 - 140 mg/dl 211(H) 105(H) 109(H)  BUN 7.0 - 26.0 mg/dL 34.5(H) 31(H) 28(H)  Creatinine 0.7 - 1.3 mg/dL 1.2 1.40(H) 1.35(H)  Sodium 136 - 145 mEq/L 137 138 138  Potassium 3.5 - 5.1 mEq/L 4.7 4.2 4.2  Chloride 101 - 111 mmol/L - 103 103  CO2 22 - 29 mEq/L 21(L) - 26  Calcium 8.4 - 10.4 mg/dL 9.5 - 9.3  Total Protein 6.4 - 8.3 g/dL 6.7 - 6.8  Total Bilirubin 0.20 - 1.20 mg/dL 0.41 - 0.7  Alkaline Phos 40 - 150 U/L 53 - 54  AST 5 - 34 U/L 28 - 27  ALT 0 - 55 U/L 32 - 23   PATHOLOGY REPORT: Diagnosis 09/21/2015 Lymph node, needle/core biopsy, hypermetabolic left sub clavicular LN - METASTATIC MALIGNANT MELANOMA. - SEE COMMENT.  Diagnosis 10/15/2015 Brain, for tumor resection, Left frontal - METASTATIC MELANOMA.     PROCEDURES: Colonoscopy 05/08/16 - Stool in the ascending colon. Fluid aspiration performed. - Diverticulosis in the entire examined colon. - Non-bleeding internal hemorrhoids. -  The examination was otherwise normal on direct and retroflexion views.  RADIOGRAPHIC STUDIES: I have personally reviewed the radiological images as listed and agreed  with the findings in the report. No results found.   CT Head 09/02/16 IMPRESSION: Somewhat mixed pattern of slight posterior progression of pleomorphic enhancement surrounding the LEFT lateral ventricle although no significant mass effect, and reduced vasogenic edema compared with the scan in April.  CT Head W WO Contrast 25-Jul-2016 IMPRESSION: Pronounced change in the left frontal region. Much more regional vasogenic edema. Relatively stable appearance of the abnormal enhancement extending from the lateral margin of the frontal horn of left lateral ventricle to the frontal brain surface. New region of abnormal enhancement surrounding the frontal horn of the left lateral ventricle measuring approximately 3.2 cm. The differential diagnosis is recurrent tumor versus radiation necrosis. The patient is reportedly not an MRI candidate. This area is large enough that CT perfusion could possibly make a reasonable differentiation.  PET Image Restage 05/29/16 IMPRESSION: 1. Stable exam with no evidence progression. 2. Photopenia in the LEFT frontal lobe at prior surgical site. 3. Stable mildly hypermetabolic RIGHT paratracheal node. This may represent a thyroid nodule. 4. Stable size of minimally metabolic lingular pulmonary nodule. 5. Stable skeletal lesion of the RIGHT iliac crest 6. No cutaneous metabolic lesion.   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   1. Metastatic melanoma to lung, node, bone and brain, BRAF V600K mutation (+) -I previously  reviewed his PET/CT scan images with pt and his family members. The scan revealed hypermetabolic left upper lobe lung mass, 2 small right lung nodules, probable bone metastasis, and a subcutaneous left supraclavicular lesion, which is not palpable  -His repeat CT had showed a 2.0 cm left frontal  hyperdense lesion with enhancement on contrast, in the area of his recent hemorrhagic stroke, highly suspicious for brain metastasis.  -We previously discussed his left supraclavicular node biopsy results, which showed metastatic melanoma.. -His case was discussed in our sarcoma tumor board. -I previously recommend a first-line treatment with Nivolumab alone, given his recent stroke, low tumor burden. I'll reserve BRAF inhibitor with or without MEK inhibitor as a second line therapy if he progress on immunotherapy. Other options of immunotherapy, especially checkpoint inhibitor with ipilumimab, were also discussed with pt and his daughter -He has been tolerating Nivolumab well, no significant side effects except fatigue We'll continue -He is now status post SRS and surgical resection to his solitary brain metastasis on 10/15/15. -I previously reviewed his recent PET scan findings from 05/29/16, which showed stable disease in lung, node and bone. His previous hypermetabolic uptake in the right master muscle has resolved, no other new lesions  -We previously discussed the FDA has recently approved that Nivolumab could be given every 4 weeks, but at a higher dose. However, insurance has not approved this yet. Once insurance approves this treatment option, then he could consider this. -He is tolerating Nivolumab without any significant side effects. -We'll continue Xgeva monthly for his bone metastasis. -He has recently developed neurological symptoms secondary to his radionecrosis  - The patient is feeling much better since beginning dexamethasone; he is following taper instructions per radiation oncology -Lab results reviewed, adequate for treatment, we'll continue nivolumab today - Restaging scans in 10 days  -If his restaging PET scan shows continues response, I may hold Nivolumab for the next 2 months when he  is on steroids. Still less likely will potentially interact the effect of Nivolumab due to its  immunosuppression. I may consider switching him to BRAF inhibitor when he is off Nivolumab    2. Hemorrhagic stroke and brain metastasis  -I encouraged him to continue physical therapy and occupational therapy. He has recovered very well -History post SRS and surgical resection of his solitary brain metastasis  -His neurology deficit has much improved -He recently received a course of dexamethasone for radionecrosis -He went to ED for slurred speech and vomiting and a Head CT was taken 09/02/16 -Per rad/onc, we suggest he taking dexamethasone 75m BID for a month and then 276mdaily for a month then stop.  -Next CT head will be in 3 months and will cancel June CT   3. Fatigue, hypothyroidism and hypotestosteronemia -He has baseline hypothyroidism and hypotestosteronemia -His fatigue has much improved since he restarted testosterone injection by his primary care physician -He will follow-up with his primary care physician Dr. RuAsher Muiror his Synthroid dose adjustment  -His TSH was slightly In November 2017. TSH 9.6 on 06/15/16. - TSH 25.01 on 08/10/16. -The patient will continue with light exercise as he is able.  4. Anemia and history of GI bleeding  -his anemia has resolved now -I previously encouraged him to continue oral iron supplement for a total of 3 months   5. HTN -he is on amlodipine and carvedilol, follow-up of his primary care physician  6. Depression  -he is on zoloft 5012maily, management by his PCP  -improved overall   7. CKD stage III -His Cr has been slightly elevated -I previously encouraged him to drink water adequately -avoid NSAIDs  8. Goal of care discussion  -We again discussed the incurable nature of his metastatic melanoma, and the overall poor prognosis, especially if he does not have good response to chemotherapy or progress on chemo -The patient understands the goal of care is palliative. -I recommend DNR/DNI, he will think about it    Plan -Cancel June  CT which was scheduled before -PET in 10 days   -Labs reviewed, adequate to continue Nivolumab treatment today, will proceed Nivolumab -f/u in 2 weeks, if PET shows good PR, will hold Nivolumab due to his chronic course of dexa, and probably switch to dabrafenib and trametinib  - Continue dexamethasone per rad/onc -I wrote down the instructions on paper for pt, and briefly communicated with his daughter KelClaiborne BillingsAll questions were answered. The patient knows to call the clinic with any problems, questions or concerns. No barriers to learning was detected.  I spent 30 minutes counseling the patient face to face. The total time spent in the appointment was 35 minutes and more than 50% was on counseling and review of test results.  This document serves as a record of services personally performed by YanTruitt MerleD. It was created on her behalf by AmoJoslyn Devon trained medical scribe. The creation of this record is based on the scribe's personal observations and the provider's statements to them. This document has been checked and approved by the attending provider.     FenTruitt MerleD 09/06/2016

## 2016-09-02 ENCOUNTER — Other Ambulatory Visit: Payer: Self-pay

## 2016-09-02 ENCOUNTER — Encounter (HOSPITAL_COMMUNITY): Payer: Self-pay | Admitting: *Deleted

## 2016-09-02 ENCOUNTER — Emergency Department (HOSPITAL_COMMUNITY): Payer: Medicare Other

## 2016-09-02 ENCOUNTER — Emergency Department (HOSPITAL_COMMUNITY)
Admission: EM | Admit: 2016-09-02 | Discharge: 2016-09-02 | Disposition: A | Discharge status: Home / Self Care | Payer: Medicare Other | Attending: Emergency Medicine | Admitting: Emergency Medicine

## 2016-09-02 DIAGNOSIS — I252 Old myocardial infarction: Secondary | ICD-10-CM | POA: Insufficient documentation

## 2016-09-02 DIAGNOSIS — I6789 Other cerebrovascular disease: Secondary | ICD-10-CM

## 2016-09-02 DIAGNOSIS — Z8673 Personal history of transient ischemic attack (TIA), and cerebral infarction without residual deficits: Secondary | ICD-10-CM | POA: Diagnosis not present

## 2016-09-02 DIAGNOSIS — Z79899 Other long term (current) drug therapy: Secondary | ICD-10-CM | POA: Diagnosis not present

## 2016-09-02 DIAGNOSIS — E039 Hypothyroidism, unspecified: Secondary | ICD-10-CM | POA: Diagnosis not present

## 2016-09-02 DIAGNOSIS — Z951 Presence of aortocoronary bypass graft: Secondary | ICD-10-CM | POA: Insufficient documentation

## 2016-09-02 DIAGNOSIS — C7931 Secondary malignant neoplasm of brain: Secondary | ICD-10-CM | POA: Diagnosis not present

## 2016-09-02 DIAGNOSIS — Z5181 Encounter for therapeutic drug level monitoring: Secondary | ICD-10-CM | POA: Insufficient documentation

## 2016-09-02 DIAGNOSIS — T66XXXA Radiation sickness, unspecified, initial encounter: Secondary | ICD-10-CM | POA: Diagnosis not present

## 2016-09-02 DIAGNOSIS — Z950 Presence of cardiac pacemaker: Secondary | ICD-10-CM | POA: Insufficient documentation

## 2016-09-02 DIAGNOSIS — R519 Headache, unspecified: Secondary | ICD-10-CM

## 2016-09-02 DIAGNOSIS — Z85841 Personal history of malignant neoplasm of brain: Secondary | ICD-10-CM | POA: Diagnosis not present

## 2016-09-02 DIAGNOSIS — I2510 Atherosclerotic heart disease of native coronary artery without angina pectoris: Secondary | ICD-10-CM | POA: Insufficient documentation

## 2016-09-02 DIAGNOSIS — R51 Headache: Secondary | ICD-10-CM | POA: Insufficient documentation

## 2016-09-02 DIAGNOSIS — N183 Chronic kidney disease, stage 3 (moderate): Secondary | ICD-10-CM | POA: Insufficient documentation

## 2016-09-02 DIAGNOSIS — I129 Hypertensive chronic kidney disease with stage 1 through stage 4 chronic kidney disease, or unspecified chronic kidney disease: Secondary | ICD-10-CM | POA: Insufficient documentation

## 2016-09-02 DIAGNOSIS — Y842 Radiological procedure and radiotherapy as the cause of abnormal reaction of the patient, or of later complication, without mention of misadventure at the time of the procedure: Secondary | ICD-10-CM

## 2016-09-02 DIAGNOSIS — Z87891 Personal history of nicotine dependence: Secondary | ICD-10-CM | POA: Insufficient documentation

## 2016-09-02 DIAGNOSIS — R4182 Altered mental status, unspecified: Secondary | ICD-10-CM

## 2016-09-02 DIAGNOSIS — Z7982 Long term (current) use of aspirin: Secondary | ICD-10-CM | POA: Insufficient documentation

## 2016-09-02 LAB — COMPREHENSIVE METABOLIC PANEL
ALT: 23 U/L (ref 17–63)
AST: 27 U/L (ref 15–41)
Albumin: 3.8 g/dL (ref 3.5–5.0)
Alkaline Phosphatase: 54 U/L (ref 38–126)
Anion gap: 9 (ref 5–15)
BUN: 28 mg/dL — ABNORMAL HIGH (ref 6–20)
CO2: 26 mmol/L (ref 22–32)
Calcium: 9.3 mg/dL (ref 8.9–10.3)
Chloride: 103 mmol/L (ref 101–111)
Creatinine, Ser: 1.35 mg/dL — ABNORMAL HIGH (ref 0.61–1.24)
GFR calc Af Amer: 57 mL/min — ABNORMAL LOW (ref >60)
GFR calc non Af Amer: 49 mL/min — ABNORMAL LOW (ref >60)
Glucose, Bld: 109 mg/dL — ABNORMAL HIGH (ref 65–99)
Potassium: 4.2 mmol/L (ref 3.5–5.1)
Sodium: 138 mmol/L (ref 135–145)
Total Bilirubin: 0.7 mg/dL (ref 0.3–1.2)
Total Protein: 6.8 g/dL (ref 6.5–8.1)

## 2016-09-02 LAB — PROTIME-INR
INR: 1
Prothrombin Time: 13.2 seconds (ref 11.4–15.2)

## 2016-09-02 LAB — I-STAT CHEM 8, ED
BUN: 31 mg/dL — ABNORMAL HIGH (ref 6–20)
Calcium, Ion: 1.14 mmol/L — ABNORMAL LOW (ref 1.15–1.40)
Chloride: 103 mmol/L (ref 101–111)
Creatinine, Ser: 1.4 mg/dL — ABNORMAL HIGH (ref 0.61–1.24)
Glucose, Bld: 105 mg/dL — ABNORMAL HIGH (ref 65–99)
HCT: 48 % (ref 39.0–52.0)
Hemoglobin: 16.3 g/dL (ref 13.0–17.0)
Potassium: 4.2 mmol/L (ref 3.5–5.1)
Sodium: 138 mmol/L (ref 135–145)
TCO2: 27 mmol/L (ref 0–100)

## 2016-09-02 LAB — CBC
HCT: 49 % (ref 39.0–52.0)
Hemoglobin: 17.1 g/dL — ABNORMAL HIGH (ref 13.0–17.0)
MCH: 32.1 pg (ref 26.0–34.0)
MCHC: 34.9 g/dL (ref 30.0–36.0)
MCV: 92.1 fL (ref 78.0–100.0)
Platelets: 207 10*3/uL (ref 150–400)
RBC: 5.32 MIL/uL (ref 4.22–5.81)
RDW: 13 % (ref 11.5–15.5)
WBC: 10 10*3/uL (ref 4.0–10.5)

## 2016-09-02 LAB — DIFFERENTIAL
Basophils Absolute: 0 10*3/uL (ref 0.0–0.1)
Basophils Relative: 0 %
Eosinophils Absolute: 0.1 10*3/uL (ref 0.0–0.7)
Eosinophils Relative: 1 %
Lymphocytes Relative: 5 %
Lymphs Abs: 0.5 10*3/uL — ABNORMAL LOW (ref 0.7–4.0)
Monocytes Absolute: 0.9 10*3/uL (ref 0.1–1.0)
Monocytes Relative: 9 %
Neutro Abs: 8.5 10*3/uL — ABNORMAL HIGH (ref 1.7–7.7)
Neutrophils Relative %: 85 %

## 2016-09-02 LAB — CBG MONITORING, ED: Glucose-Capillary: 102 mg/dL — ABNORMAL HIGH (ref 65–99)

## 2016-09-02 LAB — I-STAT TROPONIN, ED: Troponin i, poc: 0.04 ng/mL (ref 0.00–0.08)

## 2016-09-02 LAB — APTT: aPTT: 36 seconds (ref 24–36)

## 2016-09-02 MED ORDER — MORPHINE SULFATE (PF) 4 MG/ML IV SOLN
4.0000 mg | Freq: Once | INTRAVENOUS | Status: AC
  Administered 2016-09-02: 4 mg via INTRAVENOUS
  Filled 2016-09-02: qty 1

## 2016-09-02 MED ORDER — DEXAMETHASONE 4 MG PO TABS: 4.0000 mg | ORAL_TABLET | Freq: Two times a day (BID) | ORAL | 0 refills | Status: DC

## 2016-09-02 MED ORDER — DEXAMETHASONE 2 MG PO TABS: 2.0000 mg | ORAL_TABLET | Freq: Two times a day (BID) | ORAL | 0 refills | Status: AC

## 2016-09-02 NOTE — Discharge Instructions (Addendum)
Decadron 2mg  BID

## 2016-09-02 NOTE — ED Triage Notes (Signed)
To ED via pov for eval of ams since waking this am. Wife states pt has done normal am routine but was 'just quiet'. Pt unable to get some thoughts out. States he had a 'really bad' HA earlier but better after vomiting. Pt without noted weakness. Pupils equal. No slurred speech noted.

## 2016-09-02 NOTE — ED Notes (Signed)
ED Provider at bedside. 

## 2016-09-02 NOTE — ED Notes (Signed)
Pt discharged to the lobby to wait for family. Sister stated she was on the way to pick pt up.

## 2016-09-02 NOTE — ED Provider Notes (Signed)
[facility] DEPT Provider Note    By signing my name below, I, Bea Graff, attest that this documentation has been prepared under the direction and in the presence of Virgel Manifold, MD. Electronically Signed: Bea Graff, ED Scribe. 09/02/16. 2:13 PM.    History   Chief Complaint Chief Complaint  Patient presents with  . Altered Mental Status   The history is provided by the patient and medical records. No language interpreter was used.    David [name] is a 76 y.o. male with PMHx of brain cancer who presents to the Emergency Department complaining of AMS. He reports associated HA that began 2-3 weeks ago. Sister reports he was retching and spitting up earlier today which helped relieve the HA. His daughter whom is a physician advised him to come here after he reported this to her. Sister states pt has been complaining that he has been confused for the past few days and cannot convey what he is thinking. He reports generalized weakness. He has not taken anything for his symptoms. There are no modifying factors noted. He denies numbness or tingling of any extremity, visual changes. Pt is followed by Dr. Burr Medico.   Past Medical History:  Diagnosis Date  . Anginal pain ([place])   . Arthritis    mostly hands  . Benign familial hematuria   . BPH (benign prostatic hypertrophy)   . Brain cancer ([place])    melanoma with brain met  . Coronary artery disease   . GERD (gastroesophageal reflux disease)   . High cholesterol   . Hypertension   . Hypothyroidism   . Intracerebral hemorrhage ([place])   . Melanoma ([place])   . Metastatic melanoma to lung ([place])   . Mood disorder ([place])   . Myocardial infarction ([place])   . Pacemaker    due to syncope, 3rd degree HB (upgrade to [facility] [employer] [facility]. Jude CRT-P 02/25/13 (Dr. Uvaldo Rising)  . Rectal bleed    due to NSAIDS  . Renal disorder   . Skin cancer    melanoma  . Sleep apnea    uses CPAP  . Stroke [facility])     Patient Active Problem List   Diagnosis Date  Noted  . Nontraumatic cortical hemorrhage of left cerebral hemisphere ([place]) 06/27/2016  . Hyperkalemia 05/17/2016  . Hematochezia 05/06/2016  . Goals of care, counseling/discussion 05/03/2016  . Hypothyroidism (acquired) 11/24/2015  . Hypotestosteronemia 11/24/2015  . Metastatic melanoma to lung ([place])   . Mood disorder ([place])   . Intracerebral hemorrhage ([place])   . BPH (benign prostatic hypertrophy)   . Metastasis to brain ([place]) 10/15/2015  . Metastatic melanoma ([place]) 09/22/2015  . Mass of lower lobe of left lung   . Chronic kidney disease, stage III (moderate)   . Hyperlipidemia 08/26/2015  . Obesity 08/26/2015  . Incontinence 08/26/2015  . Hemorrhagic stroke ([place]) 08/26/2015  . Hemiplegia and hemiparesis following nontraumatic intracerebral hemorrhage affecting right dominant side ([place]) 08/26/2015  . Aphasia following nontraumatic intracerebral hemorrhage 08/26/2015  . Gait disturbance, post-stroke 08/26/2015  . Benign essential HTN   . OSA (obstructive sleep apnea)   . CKD (chronic kidney disease)   . Pacemaker   . ICH (intracerebral hemorrhage) ([facility]) - L frontal due to HTN vs CAA 08/22/2015    Past Surgical History:  Procedure Laterality Date  . APPLICATION OF CRANIAL NAVIGATION N/A 10/15/2015   Procedure: APPLICATION OF CRANIAL NAVIGATION;  Surgeon: Erline Levine, MD;  Location: [facility] NEURO ORS;  Service: Neurosurgery;  Laterality: N/A;  . CARDIAC CATHETERIZATION  2013  . CARDIAC SURGERY     bypass X 2  . COLONOSCOPY WITH PROPOFOL N/A 05/08/2016   Procedure: COLONOSCOPY WITH PROPOFOL;  Surgeon: Jonathon Bellows, MD;  Location: [facility] ENDOSCOPY;  Service: Gastroenterology;  Laterality: N/A;  . CORONARY ARTERY BYPASS GRAFT  2013   LIMA-LAD, SVG-PDA 03/27/11 (Dr. Francee Gentile)  . CRANIOTOMY N/A 10/15/2015   Procedure: LEFT FRONTAL CRANIOTOMY TUMOR EXCISION with Curve;  Surgeon: Erline Levine, MD;  Location: [facility] NEURO ORS;  Service: Neurosurgery;  Laterality: N/A;  CRANIOTOMY TUMOR EXCISION  . EYE  SURGERY    . FOOT SURGERY  08/2010  . JOINT REPLACEMENT Left    partial knee  . KNEE ARTHROSCOPY  12/2008  . LYMPH NODE BIOPSY    . PACEMAKER INSERTION    . TONSILLECTOMY         Home Medications    Prior to Admission medications   Medication Sig Start Date End Date Taking? Authorizing Provider  acetaminophen (TYLENOL) 325 MG tablet Take 2 tablets (650 mg total) by mouth every 6 (six) hours as needed for mild pain. Patient taking differently: Take 650 mg by mouth 2 (two) times daily.  09/09/15  Yes Love, Ivan Anchors, PA-C  amLODipine (NORVASC) 2.5 MG tablet Take 2.5 mg by mouth daily. 05/24/16  Yes [provider]  ASPIRIN LOW DOSE 81 MG EC tablet Take 81 mg by mouth daily 04/24/16  Yes [provider]  carvedilol (COREG) 6.25 MG tablet Take 6.25 mg by mouth 2 (two) times daily with a meal.   Yes [provider]  ferrous sulfate 325 (65 FE) MG tablet Take 1 tablet (325 mg total) by mouth daily with breakfast. 05/08/16  Yes Sudini, Artist, MD  levothyroxine (SYNTHROID, LEVOTHROID) 175 MCG tablet Take 175 mcg by mouth daily before breakfast.   Yes [provider]  Multiple Vitamin (MULTIVITAMIN WITH MINERALS) TABS tablet Take 1 tablet by mouth daily.   Yes [provider]  pentoxifylline (TRENTAL) 400 MG CR tablet Take 1 tablet (400 mg total) by mouth 2 (two) times daily. 07/24/16  Yes Hayden Pedro, PA-C  PRESCRIPTION MEDICATION Inhale into the lungs at bedtime. CPAP   Yes [provider]  rosuvastatin (CRESTOR) 10 MG tablet Take 10 mg by mouth at bedtime.   Yes [provider]  sertraline (ZOLOFT) 50 MG tablet Take 50 mg by mouth daily.  06/26/16  Yes [provider]  testosterone cypionate (DEPOTESTOTERONE CYPIONATE) 100 MG/ML injection Inject 100 mg into the muscle every 14 (fourteen) days.    Yes [provider]  vitamin E (VITAMIN E) 400 UNIT capsule Take 1 capsule (400 Units total) by mouth 2 (two) times  daily. 07/24/16  Yes Hayden Pedro, PA-C  dexamethasone (DECADRON) 4 MG tablet Take 1 tablet (4 mg total) by mouth 2 (two) times daily. 09/02/16   [name], MD  levothyroxine (SYNTHROID, LEVOTHROID) 125 MCG tablet Take 1 tablet (125 mcg total) by mouth daily before breakfast. Patient not taking: Reported on 09/02/2016 05/08/16   Bettey Costa, MD    Family History Family History  Problem Relation Age of Onset  . Cancer Mother 64       lung   . Hypertension Mother   . Hypertension Father   . Heart attack Father   . Heart disease Father   . Rheum arthritis Sister     Social History Social History  Substance Use Topics  . Smoking status: Former Smoker    Packs/day: 1.00  Years: 3.00    Quit date: 04/11/1919  . Smokeless tobacco: Never Used  . Alcohol use 24.0 oz/week    30 Glasses of wine, 10 Cans of beer per week     Comment: 10 per week, wine per week     Allergies   Zocor [simvastatin]   Review of Systems Review of Systems All other systems reviewed and are negative for acute change except as noted in the HPI.   Physical Exam Updated Vital Signs BP (!) 137/106   Pulse 77   Temp 97.3 F (36.3 C) (Oral)   Resp 16   SpO2 95%   Physical Exam  Constitutional: He appears well-developed and well-nourished.  HENT:  Head: Normocephalic.  Eyes: EOM are normal.  Right pupil slightly larger than left. Both pupils reactive.  Neck: Normal range of motion.  Cardiovascular: Normal rate, regular rhythm and normal heart sounds.   Pulmonary/Chest: Effort normal and breath sounds normal. No respiratory distress.  Abdominal: Soft. He exhibits no distension. There is no tenderness.  Musculoskeletal: Normal range of motion.  Neurological: He is alert.  Cranial nerves otherwise intact. 5/5 strength in BUE and LLE. 4/5 in RLE. Difficulty with word finding. Expressive aphasia.  Psychiatric: He has a normal mood and affect.  Nursing note and vitals reviewed.    ED  Treatments / Results  DIAGNOSTIC STUDIES: Oxygen Saturation is 95% on RA, adequate by my interpretation.   COORDINATION OF CARE: 2:01 PM- Will order head CT. Pt verbalizes understanding and agrees to plan.  Medications - No data to display  Labs (all labs ordered are listed, but only abnormal results are displayed) Labs Reviewed  CBC - Abnormal; Notable for the following:       Result Value   Hemoglobin 17.1 (*)    All other components within normal limits  DIFFERENTIAL - Abnormal; Notable for the following:    Neutro Abs 8.5 (*)    Lymphs Abs 0.5 (*)    All other components within normal limits  CBG MONITORING, ED - Abnormal; Notable for the following:    Glucose-Capillary 102 (*)    All other components within normal limits  I-STAT CHEM 8, ED - Abnormal; Notable for the following:    BUN 31 (*)    Creatinine, Ser 1.40 (*)    Glucose, Bld 105 (*)    Calcium, Ion 1.14 (*)    All other components within normal limits  PROTIME-INR  APTT  COMPREHENSIVE METABOLIC PANEL  I-STAT TROPOININ, ED    EKG  EKG Interpretation None       Radiology No results found.   Procedures Procedures (including critical care time)  Medications Ordered in ED Medications - No data to display   Initial Impression / Assessment and Plan / ED Course  I have reviewed the triage vital signs and the nursing notes.  Pertinent labs & imaging results that were available during my care of the patient were reviewed by me and considered in my medical decision making (see chart for details).    76yM with headache and mental status changes. Hx of metastatic CA s/p radiation. HD stable. Will CT. Discussion with oncology after CT assuming symptoms attributed to this.   Final Clinical Impressions(s) / ED Diagnoses   Final diagnoses:  Acute nonintractable headache, unspecified headache type  Radiation therapy induced brain necrosis  Altered mental status, unspecified altered mental status type     New Prescriptions New Prescriptions   No medications on file  I personally preformed the services scribed in my presence. The recorded information has been reviewed is accurate. Virgel Manifold, MD.     Virgel Manifold, MD 09/10/16 [phone]

## 2016-09-03 NOTE — ED Provider Notes (Signed)
Received care of patient at [facility] from Dr. Wilson Singer. Please see prior note for hx, physical and care. Briefly, this is a 76yo male who presents with concern for headache this AM, altered mental status, worsening of baseline aphasia.    CT ordered shows progression and sequela of radiation necrosis with enhancement around the left lateral ventricle.  Patient states he feels improved at this time.  Symptoms likely secondary to metastatic melanoma, continuing radiation changes.  Given headache, symptoms, discussed with Dr. Earlie Server of Oncology. Will place on lower dose decadron 2mg  BID and have him follow up with Dr. Burr Medico this week.    Gareth Morgan, MD 09/03/16 1355

## 2016-09-05 ENCOUNTER — Telehealth: Payer: Self-pay | Admitting: Radiation Oncology

## 2016-09-05 NOTE — Telephone Encounter (Signed)
Per [name], PA-C request phoned patient to inquire about status. Patient reports he is feeling much better since starting back on Decadron. Patient evaluated over the weekend in the emergency room for altered mental status, headache and aphasia. CT revealed necrosis with enhancement along the left lateral ventricle. David [name] had the ED doc prescribe decadron 2 mg bid. Patient confirmed appointments for tomorrow 0830 lab, 0900 Feng, and 0945 opdivo infusion.

## 2016-09-05 NOTE — Telephone Encounter (Signed)
-----   Message from Hayden Pedro, [place] sent at 09/05/2016 12:22 PM EDT ----- Sam-Can you call and check on him today? We had him on a steroid taper that should have ended at the beginning of May... He also started Vitamin E and Trental. If his symptoms are no better we may need to get a CT scan to re-assess for edema to determine restarting dex. He's due at the end of June for scans.   Dr. Tammi Klippel- how do you feel about repeating CTs now?  ----- Message ----- From: [name], MD Sent: 09/02/2016  11:14 AM To: Tyler Pita, MD, #  Hi Matt,  His daughter texted me this morning that his cognitive function and ability to finish sentences is markedly worse lately. I am not sure if he is off steroids now, but I will probably call in dexa for him for this weekend, can you or Bryson Ha see him on Tuesday or Wednesday? I am seeing him on Wednesday with treatment.   Thanks  Krista Blue

## 2016-09-06 ENCOUNTER — Ambulatory Visit (HOSPITAL_BASED_OUTPATIENT_CLINIC_OR_DEPARTMENT_OTHER): Payer: Medicare Other

## 2016-09-06 ENCOUNTER — Ambulatory Visit (HOSPITAL_BASED_OUTPATIENT_CLINIC_OR_DEPARTMENT_OTHER): Payer: Medicare Other | Admitting: Hematology

## 2016-09-06 ENCOUNTER — Encounter: Payer: Self-pay | Admitting: Hematology

## 2016-09-06 ENCOUNTER — Other Ambulatory Visit (HOSPITAL_BASED_OUTPATIENT_CLINIC_OR_DEPARTMENT_OTHER): Payer: Medicare Other

## 2016-09-06 VITALS — BP 150/92 | HR 81 | Temp 97.8°F | Resp 18 | Ht 69.5 in | Wt 204.5 lb

## 2016-09-06 DIAGNOSIS — C7951 Secondary malignant neoplasm of bone: Secondary | ICD-10-CM

## 2016-09-06 DIAGNOSIS — E039 Hypothyroidism, unspecified: Secondary | ICD-10-CM | POA: Diagnosis not present

## 2016-09-06 DIAGNOSIS — I10 Essential (primary) hypertension: Secondary | ICD-10-CM

## 2016-09-06 DIAGNOSIS — C439 Malignant melanoma of skin, unspecified: Secondary | ICD-10-CM

## 2016-09-06 DIAGNOSIS — E291 Testicular hypofunction: Secondary | ICD-10-CM

## 2016-09-06 DIAGNOSIS — I619 Nontraumatic intracerebral hemorrhage, unspecified: Secondary | ICD-10-CM

## 2016-09-06 DIAGNOSIS — N183 Chronic kidney disease, stage 3 (moderate): Secondary | ICD-10-CM

## 2016-09-06 DIAGNOSIS — R5383 Other fatigue: Secondary | ICD-10-CM

## 2016-09-06 DIAGNOSIS — C799 Secondary malignant neoplasm of unspecified site: Secondary | ICD-10-CM

## 2016-09-06 DIAGNOSIS — C7931 Secondary malignant neoplasm of brain: Secondary | ICD-10-CM

## 2016-09-06 DIAGNOSIS — R21 Rash and other nonspecific skin eruption: Secondary | ICD-10-CM | POA: Diagnosis not present

## 2016-09-06 DIAGNOSIS — Z5112 Encounter for antineoplastic immunotherapy: Secondary | ICD-10-CM | POA: Diagnosis not present

## 2016-09-06 DIAGNOSIS — Z8673 Personal history of transient ischemic attack (TIA), and cerebral infarction without residual deficits: Secondary | ICD-10-CM

## 2016-09-06 DIAGNOSIS — F329 Major depressive disorder, single episode, unspecified: Secondary | ICD-10-CM

## 2016-09-06 DIAGNOSIS — C770 Secondary and unspecified malignant neoplasm of lymph nodes of head, face and neck: Secondary | ICD-10-CM

## 2016-09-06 LAB — COMPREHENSIVE METABOLIC PANEL
ALK PHOS: 53 U/L (ref 40–150)
ALT: 32 U/L (ref 0–55)
ANION GAP: 10 meq/L (ref 3–11)
AST: 28 U/L (ref 5–34)
Albumin: 3.6 g/dL (ref 3.5–5.0)
BILIRUBIN TOTAL: 0.41 mg/dL (ref 0.20–1.20)
BUN: 34.5 mg/dL — ABNORMAL HIGH (ref 7.0–26.0)
CO2: 21 meq/L — AB (ref 22–29)
Calcium: 9.5 mg/dL (ref 8.4–10.4)
Chloride: 105 mEq/L (ref 98–109)
Creatinine: 1.2 mg/dL (ref 0.7–1.3)
EGFR: 56 mL/min/{1.73_m2} — AB (ref >90)
Glucose: 211 mg/dl — ABNORMAL HIGH (ref 70–140)
POTASSIUM: 4.7 meq/L (ref 3.5–5.1)
SODIUM: 137 meq/L (ref 136–145)
TOTAL PROTEIN: 6.7 g/dL (ref 6.4–8.3)

## 2016-09-06 LAB — CBC WITH DIFFERENTIAL/PLATELET
BASO%: 0 % (ref 0.0–2.0)
BASOS ABS: 0 10*3/uL (ref 0.0–0.1)
EOS ABS: 0 10*3/uL (ref 0.0–0.5)
EOS%: 0 % (ref 0.0–7.0)
HCT: 46.9 % (ref 38.4–49.9)
HGB: 16 g/dL (ref 13.0–17.1)
LYMPH%: 6.7 % — AB (ref 14.0–49.0)
MCH: 31.9 pg (ref 27.2–33.4)
MCHC: 34.1 g/dL (ref 32.0–36.0)
MCV: 93.4 fL (ref 79.3–98.0)
MONO#: 0.2 10*3/uL (ref 0.1–0.9)
MONO%: 2.6 % (ref 0.0–14.0)
NEUT%: 90.7 % — AB (ref 39.0–75.0)
NEUTROS ABS: 6.9 10*3/uL — AB (ref 1.5–6.5)
PLATELETS: 179 10*3/uL (ref 140–400)
RBC: 5.02 10*6/uL (ref 4.20–5.82)
RDW: 13.1 % (ref 11.0–14.6)
WBC: 7.6 10*3/uL (ref 4.0–10.3)
lymph#: 0.5 10*3/uL — ABNORMAL LOW (ref 0.9–3.3)

## 2016-09-06 LAB — TSH: TSH: 2.376 m(IU)/L (ref 0.320–4.118)

## 2016-09-06 MED ORDER — SODIUM CHLORIDE 0.9 % IV SOLN
Freq: Once | INTRAVENOUS | Status: AC
  Administered 2016-09-06: 10:00:00 via INTRAVENOUS

## 2016-09-06 MED ORDER — DABRAFENIB MESYLATE 75 MG PO CAPS: 150.0000 mg | ORAL_CAPSULE | Freq: Two times a day (BID) | ORAL | 1 refills | Status: DC

## 2016-09-06 MED ORDER — TRAMETINIB DIMETHYL SULFOXIDE 2 MG PO TABS: 2.0000 mg | ORAL_TABLET | Freq: Every day | ORAL | 1 refills | Status: DC

## 2016-09-06 MED ORDER — SODIUM CHLORIDE 0.9 % IV SOLN
240.0000 mg | Freq: Once | INTRAVENOUS | Status: AC
  Administered 2016-09-06: 240 mg via INTRAVENOUS
  Filled 2016-09-06: qty 24

## 2016-09-08 ENCOUNTER — Telehealth: Payer: Self-pay | Admitting: Hematology

## 2016-09-08 NOTE — Telephone Encounter (Signed)
Patient bypassed scheduling on 09/06/16. Appointments scheduled and confirmed with patient,per 09/06/16 los.

## 2016-09-11 ENCOUNTER — Encounter: Payer: Self-pay | Admitting: Family Medicine

## 2016-09-11 DIAGNOSIS — R51 Headache: Secondary | ICD-10-CM | POA: Diagnosis not present

## 2016-09-11 DIAGNOSIS — E291 Testicular hypofunction: Secondary | ICD-10-CM | POA: Diagnosis not present

## 2016-09-11 DIAGNOSIS — F329 Major depressive disorder, single episode, unspecified: Secondary | ICD-10-CM | POA: Diagnosis not present

## 2016-09-11 DIAGNOSIS — R413 Other amnesia: Secondary | ICD-10-CM | POA: Diagnosis not present

## 2016-09-11 DIAGNOSIS — E039 Hypothyroidism, unspecified: Secondary | ICD-10-CM | POA: Diagnosis not present

## 2016-09-11 DIAGNOSIS — I10 Essential (primary) hypertension: Secondary | ICD-10-CM | POA: Diagnosis not present

## 2016-09-11 DIAGNOSIS — R531 Weakness: Secondary | ICD-10-CM | POA: Diagnosis not present

## 2016-09-11 DIAGNOSIS — C7989 Secondary malignant neoplasm of other specified sites: Secondary | ICD-10-CM | POA: Diagnosis not present

## 2016-09-11 DIAGNOSIS — Z79899 Other long term (current) drug therapy: Secondary | ICD-10-CM | POA: Diagnosis not present

## 2016-09-11 NOTE — Progress Notes (Signed)
[place]  [phone] [phone]  Clinic Follow up Note   Patient Care Team: [name], MD as PCP - General (Internal Medicine) 09/20/2016  Chief complaint: Follow-up metastatic melanoma  Oncology History   Metastatic melanoma [facility])   Staging form: Melanoma of the Skin, AJCC 7th Edition     Clinical stage from 09/21/2015: Stage IV [place], [employer], [place]) - Signed by [name], MD on 10/09/2015       Metastatic melanoma ([place])   09/16/2015 Imaging    PET scan showed a hypermetabolic 1.7BL lingular nodule, and a 107m right upper lobe lung nodule, left subclavicular nodes, and bone metastasis in the proximal right humerus and right femur.       09/21/2015 Initial Diagnosis    Metastatic melanoma ([place]      09/21/2015 Initial Biopsy    Left subclavicular lymph node biopsy showed prostatic of melanoma      09/21/2015 Miscellaneous    BRAF V600K mutation (+)       10/04/2015 Imaging    CT head with without contrast showed a 2.0 x 1.6 x 1.7 cm superficial left frontal hyperdense lesion, with postcontrast enhancement, lying within the previous hemorrhagic area, concerning for solitary melanoma metastasis      10/06/2015 - 09/06/2016 Chemotherapy    Nivolumab 2445mevery 2 weeks      10/13/2015 - 10/13/2015 Radiation Therapy    10/13/2015 Preop SRS treatment: 14 Gy  to the left frontal lesion in 1 fraction      10/15/2015 Surgery    left front craniotomy and tumor excision       10/15/2015 Pathology Results    left frontal brain mass excision showed metastatic melanoma, 1.5X1.5X0.6cm      01/13/2016 Imaging    CT head without and with contrast showed ill-defined enhancement and dural thickening of the left frontal resection cavity may represent a post treatment changes or residual neoplasm. A new 6 mm hypodensity in the right cerebellar hemisphere with uncertainty enhancement may represent a metastasis or small brain parenchymal hemorrhage.       01/27/2016 - 01/27/2016 Radiation Therapy    01/27/16 SRS Treatment:  20 Gy in 1 fraction to a 6 mm right cerebellar brain met       03/29/2016 PET scan    PET 03/29/2016 IMPRESSION: 1. Overall there has been an interval response to therapy. The index lesions sided on previous exam it are less hypermetabolic when compared with the previous exam. 2. There is an intramuscular lesion within the right masseter which has increased FDG uptake compared with previous exam. 3. No new sites of disease identified. 4. Aortic atherosclerosis and infrarenal abdominal aortic aneurysm. Recommend followup by ultrasound in 3 years. This recommendation follows ACR consensus guidelines: White Paper of the ACR Incidental Findings Committee II on Vascular Findings. J Joellyn Ruedadiol 2013; 10:789-794      04/20/2016 Imaging    CT Head w wo contrast 1. Mild patchy enhancement at the left anterior frontal lobe treatment site (series 11, image 39) without mass effect and stable surrounding white matter hypodensity without strong evidence of acute vasogenic edema. Recommend continued CT surveillance. 2. No new metastatic disease or new intracranial abnormality identified. 3. Probable benign 14 mm left parietal bone lucent lesion, unchanged since May 2017.      05/06/2016 - 05/08/2016 Hospital Admission    Pt was admitted for rectal bleeding for one day. Breathing spontaneously resolved, hemoglobin dropped from 15 to 10.5, did not  require blood transfusion. Colonoscopy showed no active bleeding, except diverticulosis and hemorrhoid. He was discharged to home.      05/08/2016 Procedure    Colonoscopy  - Stool in the ascending colon. Fluid aspiration performed. - Diverticulosis in the entire examined colon. - Non-bleeding internal hemorrhoids. - The examination was otherwise normal on direct and retroflexion views.      05/29/2016 Imaging    PET Image Restaging IMPRESSION: 1. Stable exam with no evidence  progression. 2. Photopenia in the LEFT frontal lobe at prior surgical site. 3. Stable mildly hypermetabolic RIGHT paratracheal node. This may represent a thyroid nodule. 4. Stable size of minimally metabolic lingular pulmonary nodule. 5. Stable skeletal lesion of the RIGHT iliac crest 6. No cutaneous metabolic lesion.      07/20/2016 Imaging    CT Head W WO Contrast IMPRESSION: Pronounced change in the left frontal region. Much more regional vasogenic edema. Relatively stable appearance of the abnormal enhancement extending from the lateral margin of the frontal horn of left lateral ventricle to the frontal brain surface. New region of abnormal enhancement surrounding the frontal horn of the left lateral ventricle measuring approximately 3.2 cm. The differential diagnosis is recurrent tumor versus radiation necrosis. The patient is reportedly not an MRI candidate. This area is large enough that CT perfusion could possibly make a reasonable differentiation.      09/02/2016 Imaging    CT Head 09/02/16 IMPRESSION: Somewhat mixed pattern of slight posterior progression of pleomorphic enhancement surrounding the LEFT lateral ventricle although no significant mass effect, and reduced vasogenic edema compared with the scan in April.       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 He was found to have left frontal hemorrhage 5.7 cm x 4.5 cm with 6 mm left to right shift.During the workup for his stroke, a left upper lung mass was noticed on the CT scan. I was called to evaluate his lung mass.  Patient has aphasia, not able to communicate much. According to his daughter, Dr. [name], who is a OB GYN at [employer], he probably only smoked for very short period time during the college. He did have  3 skin melanoma, which was removed, last one was 2-3 years ago. No other cancer history.  CURRENT THERAPY: Nivolumab 240 mg, every 2 weeks, started on 10/06/2015 changed to dabrafenib and trametinib 09/27/16  INTERVAL HISTORY:  Mr. Nusz returns for follow-up and nivolumab therapy. He presents to the clinic today with his wife reporting things are the same. He thinks his speech and memory has not improved. When he goes into the hot sun his memory wilts away. His wife wants him to more active. Appetite is good. No issues taking dexamethosone but he his wired at night. He still gets enough sleep. He is on 38m once a day for dexamethasone nd is not sure how long to be on them. He claims to have a cardiologist and his wife prefers he has a cardiologist at [facility] His wife says he has CAD and a heart bypass in the past. His wife says he has 2 prescriptions of steroids and wants to know which he showed take. He will contact [place] he sees the label of his pill   REVIEW OF SYSTEMS:  Constitutional: Denies fevers, chills or  abnormal weight loss, (+) fatigue (+) confusion (+) head pain  Eyes: Denies blurriness of vision Ears, nose, mouth, throat, and face: Denies mucositis or sore throat Respiratory: Denies cough, dyspnea or wheezes Cardiovascular: Denies palpitation, chest discomfort or lower extremity swelling Gastrointestinal:  Denies nausea, heartburn, hematochezia (+) mild constipation from iron pill. Skin: (+) mild rash on bilateral upper extremities which is mildly tender Lymphatics: Denies new lymphadenopathy or easy bruising Neurological:see HPI  Behavioral/Psych: Mood is stable, no new changes  All other systems were reviewed with the patient and are negative.  MEDICAL HISTORY:  Past Medical History:  Diagnosis Date  . Anginal pain ([place])   . Arthritis    mostly hands  . Benign familial hematuria   . BPH (benign prostatic hypertrophy)   . Brain cancer ([place])    melanoma with brain met  .  Coronary artery disease   . GERD (gastroesophageal reflux disease)   . High cholesterol   . Hypertension   . Hypothyroidism   . Intracerebral hemorrhage ([place])   . Melanoma ([place])   . Metastatic melanoma to lung ([place])   . Mood disorder ([place])   . Myocardial infarction ([place])   . Pacemaker    due to syncope, 3rd degree HB (upgrade to [facility]. Jude CRT-P 02/25/13 (Dr. Uvaldo Rising)  . Rectal bleed    due to NSAIDS  . Renal disorder   . Skin cancer    melanoma  . Sleep apnea    uses CPAP  . Stroke [facility] ([place]))     SURGICAL HISTORY: Past Surgical History:  Procedure Laterality Date  . APPLICATION OF CRANIAL NAVIGATION N/A 10/15/2015   Procedure: APPLICATION OF CRANIAL NAVIGATION;  Surgeon: Erline Levine, MD;  Location: [place] NEURO ORS;  Service: Neurosurgery;  Laterality: N/A;  . CARDIAC CATHETERIZATION  2013  . CARDIAC SURGERY     bypass X 2  . COLONOSCOPY WITH PROPOFOL N/A 05/08/2016   Procedure: COLONOSCOPY WITH PROPOFOL;  Surgeon: Jonathon Bellows, MD;  Location: [facility] ENDOSCOPY;  Service: Gastroenterology;  Laterality: N/A;  . CORONARY ARTERY BYPASS GRAFT  2013   LIMA-LAD, SVG-PDA 03/27/11 (Dr. Francee Gentile)  . CRANIOTOMY N/A 10/15/2015   Procedure: LEFT FRONTAL CRANIOTOMY TUMOR EXCISION with Curve;  Surgeon: Erline Levine, MD;  Location: [place] NEURO ORS;  Service: Neurosurgery;  Laterality: N/A;  CRANIOTOMY TUMOR EXCISION  . EYE SURGERY    . FOOT SURGERY  08/2010  . JOINT REPLACEMENT Left    partial knee  . KNEE ARTHROSCOPY  12/2008  . LYMPH NODE BIOPSY    . PACEMAKER INSERTION    . TONSILLECTOMY      I have reviewed the social history and family history with the patient and they are unchanged from previous note.  ALLERGIES:  is allergic to zocor [simvastatin].  MEDICATIONS:  Current Outpatient Prescriptions  Medication Sig Dispense Refill  . acetaminophen (TYLENOL) 325 MG tablet Take 2 tablets (650 mg total) by mouth every 6 (six) hours as needed for mild pain. (Patient taking differently: Take 650 mg by  mouth 2 (two) times daily. )    . amLODipine (NORVASC) 2.5 MG tablet Take 2.5 mg by mouth daily.    . ASPIRIN LOW DOSE 81 MG EC tablet Take 81 mg by mouth daily  11  . carvedilol (COREG) 6.25 MG tablet Take 6.25 mg by mouth 2 (two) times daily with a meal.    . dabrafenib mesylate (TAFINLAR) 75 MG capsule Take 2 capsules (150 mg total) by mouth 2 (two) times daily. Take on an  empty stomach 1 hour before or 2 hours after meals. 120 capsule 1  . ferrous sulfate 325 (65 FE) MG tablet Take 1 tablet (325 mg total) by mouth daily with breakfast. 30 tablet 0  . levothyroxine (SYNTHROID, LEVOTHROID) 175 MCG tablet Take 175 mcg by mouth daily before breakfast.    . Multiple Vitamin (MULTIVITAMIN WITH MINERALS) TABS tablet Take 1 tablet by mouth daily.    . pentoxifylline (TRENTAL) 400 MG CR tablet Take 1 tablet (400 mg total) by mouth 2 (two) times daily. 60 tablet 6  . PRESCRIPTION MEDICATION Inhale into the lungs at bedtime. CPAP    . rosuvastatin (CRESTOR) 10 MG tablet Take 10 mg by mouth at bedtime.    . sertraline (ZOLOFT) 50 MG tablet Take 100 mg by mouth daily.     Marland Kitchen testosterone cypionate (DEPOTESTOTERONE CYPIONATE) 100 MG/ML injection Inject 100 mg into the muscle every 14 (fourteen) days.     . trametinib dimethyl sulfoxide (MEKINIST) 2 MG tablet Take 1 tablet (2 mg total) by mouth daily. Take 1 hour before or 2 hours after a meal. Store refrigerated in original container. 30 tablet 1  . vitamin E (VITAMIN E) 400 UNIT capsule Take 1 capsule (400 Units total) by mouth 2 (two) times daily. 60 capsule 6   No current facility-administered medications for this visit.    Facility-Administered Medications Ordered in Other Visits  Medication Dose Route Frequency Provider Last Rate Last Dose  . 0.9 %  sodium chloride infusion  1,000 mL Intravenous Once [name], MD        PHYSICAL EXAMINATION: ECOG PERFORMANCE STATUS: 1  Vitals:   09/20/16 0839  BP: 128/81  Pulse: 77  Resp: 18  Temp: 97.9 F  (36.6 C)   Filed Weights   09/20/16 0839  Weight: 200 lb 11.2 oz (91 kg)    GENERAL:alert, no distress and comfortable SKIN: skin color, texture, turgor are normal, (+) diffuse papular skin rash on bilateral forearms and hand, improved EYES: normal, Conjunctiva are pink and non-injected, sclera clear OROPHARYNX:no exudate, no erythema and lips, buccal mucosa, and tongue normal  NECK: supple, thyroid normal size, non-tender, without nodularity LYMPH:  no palpable lymphadenopathy in the cervical, [place], axillary or inguinal LUNGS: clear to auscultation and percussion with normal breathing effort HEART: regular rate & rhythm and no murmurs and no lower extremity edema ABDOMEN:abdomen soft, non-tender and normal bowel sounds Musculoskeletal:no cyanosis of digits and no clubbing  NEURO: alert & oriented x 3 with fluent speech, no focal motor/sensory deficits, he walks independently, gait is normal   LABORATORY DATA:  I have reviewed the data as listed CBC Latest Ref Rng & Units 09/20/2016 09/06/2016 09/02/2016  WBC 4.0 - 10.3 10e3/uL 8.6 7.6 -  Hemoglobin 13.0 - 17.1 g/dL 17.1 16.0 16.3  Hematocrit 38.4 - 49.9 % 49.4 46.9 48.0  Platelets 140 - 400 10e3/uL 92(L) 179 -     CMP Latest Ref Rng & Units 09/20/2016 09/06/2016 09/02/2016  Glucose 70 - 140 mg/dl 85 211(H) 105(H)  BUN 7.0 - 26.0 mg/dL 37.0(H) 34.5(H) 31(H)  Creatinine 0.7 - 1.3 mg/dL 1.1 1.2 1.40(H)  Sodium 136 - 145 mEq/L 136 137 138  Potassium 3.5 - 5.1 mEq/L 5.0 4.7 4.2  Chloride 101 - 111 mmol/L - - 103  CO2 22 - 29 mEq/L 25 21(L) -  Calcium 8.4 - 10.4 mg/dL 9.4 9.5 -  Total Protein 6.4 - 8.3 g/dL 6.2(L) 6.7 -  Total Bilirubin 0.20 - 1.20 mg/dL 0.66  0.41 -  Alkaline Phos 40 - 150 U/L 56 53 -  AST 5 - 34 U/L 24 28 -  ALT 0 - 55 U/L 47 32 -   PATHOLOGY REPORT: Diagnosis 09/21/2015 Lymph node, needle/core biopsy, hypermetabolic left sub clavicular LN - METASTATIC MALIGNANT MELANOMA. - SEE COMMENT.  Diagnosis  10/15/2015 Brain, for tumor resection, Left frontal - METASTATIC MELANOMA.     PROCEDURES: Colonoscopy 05/08/16 - Stool in the ascending colon. Fluid aspiration performed. - Diverticulosis in the entire examined colon. - Non-bleeding internal hemorrhoids. - The examination was otherwise normal on direct and retroflexion views.  RADIOGRAPHIC STUDIES: I have personally reviewed the radiological images as listed and agreed with the findings in the report. No results found.     CT Head 09/02/16 IMPRESSION: Somewhat mixed pattern of slight posterior progression of pleomorphic enhancement surrounding the LEFT lateral ventricle although no significant mass effect, and reduced vasogenic edema compared with the scan in April.  CT Head W WO Contrast 08-09-2016 IMPRESSION: Pronounced change in the left frontal region. Much more regional vasogenic edema. Relatively stable appearance of the abnormal enhancement extending from the lateral margin of the frontal horn of left lateral ventricle to the frontal brain surface. New region of abnormal enhancement surrounding the frontal horn of the left lateral ventricle measuring approximately 3.2 cm. The differential diagnosis is recurrent tumor versus radiation necrosis. The patient is reportedly not an MRI candidate. This area is large enough that CT perfusion could possibly make a reasonable differentiation.  PET Image Restage 05/29/16 IMPRESSION: 1. Stable exam with no evidence progression. 2. Photopenia in the LEFT frontal lobe at prior surgical site. 3. Stable mildly hypermetabolic RIGHT paratracheal node. This may represent a thyroid nodule. 4. Stable size of minimally metabolic lingular pulmonary nodule. 5. Stable skeletal lesion of the RIGHT iliac crest 6. No cutaneous metabolic lesion.   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   1. Metastatic melanoma to lung, node, bone and brain, BRAF V600K mutation (+) -I previously  reviewed his PET/CT scan images with pt and his family members. The scan revealed hypermetabolic left upper lobe lung mass, 2 small right lung nodules, probable bone metastasis, and a subcutaneous left supraclavicular lesion, which is not palpable  -His repeat CT had showed a 2.0 cm left frontal hyperdense lesion with enhancement on contrast, in the area of his recent hemorrhagic stroke, highly suspicious for brain metastasis.  -We previously discussed his left supraclavicular node biopsy results, which showed metastatic melanoma.. -His case was discussed in our sarcoma tumor board. -I previously recommend a first-line treatment with Nivolumab alone, given his recent stroke, low tumor burden. I'll reserve BRAF inhibitor with or without MEK inhibitor as a second line therapy if he progress on immunotherapy. Other options of immunotherapy, especially checkpoint inhibitor with ipilumimab, were also discussed with pt and his daughter -He has been tolerating Nivolumab well, no significant side effects except fatigue We'll continue -He is now status post SRS and surgical resection to his solitary brain metastasis on 10/15/15. -I previously reviewed his recent PET scan findings from 05/29/16, which showed stable disease in lung, node and bone. His previous hypermetabolic uptake in the right master muscle has resolved, no other new lesions  -He is tolerating Nivolumab without any significant side effects. -We'll continue Xgeva monthly for his bone metastasis. 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 I recommend switching him to BRAF inhibitor and MEK  inhibitor -He is still being weened off dexamethasone by red onc. He will contact us to tell us how he is taking his steroids. Will send note to Dr. Tammi Klippel   -We discussed an oral drug dabrafenib and trametinib replace to [facility] and we discussed side effects, which includes but not limited to skin toxicity, colitis, uvulitis, cardiomyopathy, skin cancers, pneumonitis, fatigue, nausea, etc. He agrees to proceed. His income is high so he now has high co-pay for medication, our oral pharmacist Denyse Amass is working on his manufacturer assistance -He will need a ECHO and EKG soon and another after starting. He had previous history of coronary artery disease and bypass surgery, he sees a cardiologist at [facility], his daughter prefers him to change his cardiac status to [place]. I'll refer him to Dr. Haroldine Laws.   -EKG repeated today which shows a right bundle block and paced rhythm,QTc 497.  -will get a restaging PET in the next few months    2. Hemorrhagic stroke and brain metastasis  -I encouraged him to continue physical therapy and occupational therapy. He has recovered very well -History post SRS and surgical resection of his solitary brain metastasis  -His neurology deficit has much improved -He recently received a course of dexamethasone for radionecrosis -He went to ED for slurred speech and vomiting and a Head CT was taken 09/02/16 -Per rad/onc, we suggest he taking dexamethasone 7m BID for a month and then 224mdaily for a month then stop.  -His next CTs range is scheduled for June 28   3. Fatigue, hypothyroidism and hypotestosteronemia -He has baseline hypothyroidism and hypotestosteronemia -His fatigue has much improved since he restarted testosterone injection by his primary care physician -He will follow-up with his primary care physician Dr. RuAsher Muiror his Synthroid dose adjustment  -His TSH was slightly In November 2017. TSH 9.6 on 06/15/16. - TSH 25.01 on 08/10/16. -The patient will  continue with light exercise as he is able. -I again encouraged him to be active  4. Anemia and history of GI bleeding  -his anemia has resolved now -I previously encouraged him to continue oral iron supplement for a total of 3 months   5. HTN -he is on amlodipine and carvedilol, follow-up of his primary care physician -Due to drug interaction with new oral chemo he iwll ned to monitor his BP more often.   6. Depression  -he is on zoloft 5062maily, management by his PCP  -improved overall   7. CKD stage III -His Cr has been slightly elevated -I previously encouraged him to drink water adequately -avoid NSAIDs  8. Goal of care discussion  -We previously discussed the incurable nature of his metastatic melanoma, and the overall poor prognosis, especially if he does not have good response to chemotherapy or progress on chemo -The patient understands the goal of care is palliative. -I recommend DNR/DNI, he will think about it   9. CAD -2 double vessel bypass -PacPsychologist, forensicer pt (her daughter) request, will switch his cardiologist to Dr. BenHaroldine Laws Plan -labs today [facility] and Order ECHO for next week with Dr. BenHaroldine LawsKG today -Start dabrafenib and trametinib next week - Continue dexamethasone per rad/onc, he is scheduled to have a repeat CT  brain on June 28, and f/u with rad/onc on 7/2  -labs and f/u on 7/2 with restaging PET before  -Cancel appointments on 6/27    All questions were answered. The patient knows to call the clinic with any problems, questions or concerns. No barriers to learning was detected.  I spent 30 minutes counseling the patient face to face. The total time spent in the appointment was 40 minutes and more than 50% was on counseling and review of test results.  This document serves as a record of services personally performed by [name], MD. It was created on her behalf by Joslyn Devon, a trained medical scribe. The creation  of this record is based on the scribe's personal observations and the provider's statements to them. This document has been checked and approved by the attending provider.     [name], MD 09/20/2016

## 2016-09-15 ENCOUNTER — Telehealth: Payer: Self-pay | Admitting: Pharmacist

## 2016-09-15 DIAGNOSIS — C439 Malignant melanoma of skin, unspecified: Secondary | ICD-10-CM

## 2016-09-15 DIAGNOSIS — C799 Secondary malignant neoplasm of unspecified site: Secondary | ICD-10-CM

## 2016-09-15 MED ORDER — TRAMETINIB DIMETHYL SULFOXIDE 2 MG PO TABS: 2.0000 mg | ORAL_TABLET | Freq: Every day | ORAL | 1 refills | Status: DC

## 2016-09-15 MED ORDER — DABRAFENIB MESYLATE 75 MG PO CAPS: 150.0000 mg | ORAL_CAPSULE | Freq: Two times a day (BID) | ORAL | 1 refills | Status: DC

## 2016-09-15 NOTE — Telephone Encounter (Signed)
Oral Chemotherapy Pharmacist Encounter  Received new prescriptions for Tafinlar and Mekinist for the treatment of BRAF+ metastatic melanoma.  5/30 labs reviewed, OK for treatment  Both agents carry risk of decrease in LVEF, last ECHO in Epic (08/13/15) showed EF 65-70%. Manufacturer recommends to assess LVEF by ECHO prior to initiation of combination treatment, at 1 month of treatment, and then periodically based on symptoms of heart failure.  Tafinlar also carries risk of QTc prolongation. EKGs performed in May 2018 and 2017 show baseline QTc interval of 494-509 msec. Recommend repeat EKG at next MD appointment and then 1-2 weeks after therapy initiation. Electrolytes (including Ca, Mag, and K) will be closely monitored.  Current medication list in Epic assessed, some DDIs with Tafinlar identified:  Tafinlar and Norvasc: Category D interaction. [place] Myrtle is an inducer of CYP3A4 and may decrease systemic exposure to patient's Norvasc. BP will be monitored, no change to therapy indicated at this time.  Tafinlar and Zoloft: category B interaction due to both agents ability to prolong QTc interval. Recommendation for EKGs above.  I called [employer] at [phone] to initiate PA for Tafinlar and Mekinist. Tafinlar approved 09/15/16-04/09/17, [id number] Mekinsit approved 09/15/16-04/09/17, [id number]  Prescriptions e-scribed to [facility] in [place], [place] [phone]).  Oral oncology Clinic will continue to follow.  Johny Drilling, PharmD, BCPS, BCOP 09/15/2016  8:23 AM Oral Oncology Clinic [phone]

## 2016-09-18 NOTE — Telephone Encounter (Signed)
Oral Chemotherapy Pharmacist Encounter  Received notifcation from [employer] that prior authorizations for patient's Tafinlar and Mekinst have been approved  Tafinlar 75mg  capsules Effective dates: 09/15/16-04/09/17 [id number]  Mekinist 2mg  tablets Effective dates: 09/15/16-04/09/17 [id number]  I will alert [facility] to process prescription.  Oral Oncology Clinic will continue to follow.  Johny Drilling, PharmD, BCPS, BCOP 09/18/2016  12:17 PM Oral Oncology Clinic [phone]

## 2016-09-20 ENCOUNTER — Other Ambulatory Visit (HOSPITAL_BASED_OUTPATIENT_CLINIC_OR_DEPARTMENT_OTHER): Payer: Medicare Other

## 2016-09-20 ENCOUNTER — Other Ambulatory Visit: Payer: Medicare Other

## 2016-09-20 ENCOUNTER — Ambulatory Visit (HOSPITAL_BASED_OUTPATIENT_CLINIC_OR_DEPARTMENT_OTHER): Payer: Medicare Other | Admitting: Hematology

## 2016-09-20 ENCOUNTER — Encounter: Payer: Self-pay | Admitting: Hematology

## 2016-09-20 ENCOUNTER — Ambulatory Visit: Payer: Medicare Other

## 2016-09-20 VITALS — BP 128/81 | HR 77 | Temp 97.9°F | Resp 18 | Ht 69.5 in | Wt 200.7 lb

## 2016-09-20 DIAGNOSIS — I25810 Atherosclerosis of coronary artery bypass graft(s) without angina pectoris: Secondary | ICD-10-CM | POA: Diagnosis not present

## 2016-09-20 DIAGNOSIS — N183 Chronic kidney disease, stage 3 (moderate): Secondary | ICD-10-CM | POA: Diagnosis not present

## 2016-09-20 DIAGNOSIS — E039 Hypothyroidism, unspecified: Secondary | ICD-10-CM

## 2016-09-20 DIAGNOSIS — I619 Nontraumatic intracerebral hemorrhage, unspecified: Secondary | ICD-10-CM

## 2016-09-20 DIAGNOSIS — F329 Major depressive disorder, single episode, unspecified: Secondary | ICD-10-CM | POA: Diagnosis not present

## 2016-09-20 DIAGNOSIS — E291 Testicular hypofunction: Secondary | ICD-10-CM | POA: Diagnosis not present

## 2016-09-20 DIAGNOSIS — R5383 Other fatigue: Secondary | ICD-10-CM | POA: Diagnosis not present

## 2016-09-20 DIAGNOSIS — Z8673 Personal history of transient ischemic attack (TIA), and cerebral infarction without residual deficits: Secondary | ICD-10-CM | POA: Diagnosis not present

## 2016-09-20 DIAGNOSIS — C7931 Secondary malignant neoplasm of brain: Secondary | ICD-10-CM

## 2016-09-20 DIAGNOSIS — C799 Secondary malignant neoplasm of unspecified site: Secondary | ICD-10-CM

## 2016-09-20 DIAGNOSIS — I10 Essential (primary) hypertension: Secondary | ICD-10-CM

## 2016-09-20 DIAGNOSIS — C439 Malignant melanoma of skin, unspecified: Secondary | ICD-10-CM

## 2016-09-20 DIAGNOSIS — C7951 Secondary malignant neoplasm of bone: Secondary | ICD-10-CM

## 2016-09-20 DIAGNOSIS — C770 Secondary and unspecified malignant neoplasm of lymph nodes of head, face and neck: Secondary | ICD-10-CM

## 2016-09-20 LAB — CBC WITH DIFFERENTIAL/PLATELET
BASO%: 0 % (ref 0.0–2.0)
BASOS ABS: 0 10*3/uL (ref 0.0–0.1)
EOS ABS: 0.1 10*3/uL (ref 0.0–0.5)
EOS%: 1.5 % (ref 0.0–7.0)
HEMATOCRIT: 49.4 % (ref 38.4–49.9)
HGB: 17.1 g/dL (ref 13.0–17.1)
LYMPH%: 9.9 % — AB (ref 14.0–49.0)
MCH: 32.3 pg (ref 27.2–33.4)
MCHC: 34.6 g/dL (ref 32.0–36.0)
MCV: 93.4 fL (ref 79.3–98.0)
MONO#: 0.3 10*3/uL (ref 0.1–0.9)
MONO%: 3 % (ref 0.0–14.0)
NEUT%: 85.6 % — AB (ref 39.0–75.0)
NEUTROS ABS: 7.4 10*3/uL — AB (ref 1.5–6.5)
PLATELETS: 92 10*3/uL — AB (ref 140–400)
RBC: 5.29 10*6/uL (ref 4.20–5.82)
RDW: 14.5 % (ref 11.0–14.6)
WBC: 8.6 10*3/uL (ref 4.0–10.3)
lymph#: 0.9 10*3/uL (ref 0.9–3.3)
nRBC: 0 % (ref 0–0)

## 2016-09-20 LAB — TSH: TSH: 0.941 m(IU)/L (ref 0.320–4.118)

## 2016-09-20 LAB — MAGNESIUM: MAGNESIUM: 2.6 mg/dL — AB (ref 1.5–2.5)

## 2016-09-20 LAB — COMPREHENSIVE METABOLIC PANEL
ALT: 47 U/L (ref 0–55)
ANION GAP: 8 meq/L (ref 3–11)
AST: 24 U/L (ref 5–34)
Albumin: 3.3 g/dL — ABNORMAL LOW (ref 3.5–5.0)
Alkaline Phosphatase: 56 U/L (ref 40–150)
BUN: 37 mg/dL — ABNORMAL HIGH (ref 7.0–26.0)
CALCIUM: 9.4 mg/dL (ref 8.4–10.4)
CHLORIDE: 103 meq/L (ref 98–109)
CO2: 25 mEq/L (ref 22–29)
CREATININE: 1.1 mg/dL (ref 0.7–1.3)
EGFR: 65 mL/min/{1.73_m2} — AB (ref >90)
Glucose: 85 mg/dl (ref 70–140)
Potassium: 5 mEq/L (ref 3.5–5.1)
Sodium: 136 mEq/L (ref 136–145)
Total Bilirubin: 0.66 mg/dL (ref 0.20–1.20)
Total Protein: 6.2 g/dL — ABNORMAL LOW (ref 6.4–8.3)

## 2016-09-21 DIAGNOSIS — E039 Hypothyroidism, unspecified: Secondary | ICD-10-CM | POA: Diagnosis not present

## 2016-09-25 ENCOUNTER — Telehealth: Payer: Self-pay | Admitting: Radiation Oncology

## 2016-09-25 ENCOUNTER — Encounter: Payer: Self-pay | Admitting: Radiation Oncology

## 2016-09-25 DIAGNOSIS — R531 Weakness: Secondary | ICD-10-CM | POA: Diagnosis not present

## 2016-09-25 DIAGNOSIS — E291 Testicular hypofunction: Secondary | ICD-10-CM | POA: Diagnosis not present

## 2016-09-25 DIAGNOSIS — Z79899 Other long term (current) drug therapy: Secondary | ICD-10-CM | POA: Diagnosis not present

## 2016-09-25 DIAGNOSIS — R450 Nervousness: Secondary | ICD-10-CM | POA: Diagnosis not present

## 2016-09-25 DIAGNOSIS — F329 Major depressive disorder, single episode, unspecified: Secondary | ICD-10-CM | POA: Diagnosis not present

## 2016-09-25 DIAGNOSIS — C7989 Secondary malignant neoplasm of other specified sites: Secondary | ICD-10-CM | POA: Diagnosis not present

## 2016-09-25 NOTE — Telephone Encounter (Signed)
Phoned patient's home. Wife reports the patient still isn't home. Explained I called earlier and explained that based upon the taper instructions he should be taking decadron 2 mg once per day. Went onto explain that Dr. Tammi Klippel has called this evening and requested I call them back instructing him to take 4 mg once per day until his follow up appointment with Dr. Tammi Klippel. Patient's wife, [name], verbalized understanding.

## 2016-09-25 NOTE — Telephone Encounter (Signed)
Phoned patient's home to discuss decadron. Patient's wife, David [name], answered. She confirms David [name] is out. Offered to call the patient's cell. David [name] reports he doesn't have his cell with him today. Left message with her explaining that the patient should have taken decadron 2 mg twice per day until 08/29/2016 at which time he should have tapered down to 2 mg daily. Explained he should continue decadron 2 mg daily until 09/29/16 and STOP. David [name] reports writing everything day and read back to me what she wrote which was correct. Left my contact information should her husband have any questions.

## 2016-09-28 ENCOUNTER — Other Ambulatory Visit: Payer: Self-pay | Admitting: Hematology

## 2016-09-28 ENCOUNTER — Telehealth: Payer: Self-pay | Admitting: Pharmacy Technician

## 2016-09-28 MED ORDER — DEXAMETHASONE 4 MG PO TABS: 4.0000 mg | ORAL_TABLET | Freq: Every day | ORAL | 0 refills | Status: DC

## 2016-09-28 NOTE — Telephone Encounter (Signed)
Received notification from [employer] Patient Assistance program that patient has been successfully enrolled into their program to receive Tafinlar and Mekinist at $0 until 04/09/17 from the manufacturer.  I called and spoke with patient's daughter.  They had not yet heard from [employer].  I advised that if they don't hear from them by end of business today, that they should call tomorrow.  I provided the contact number [phone]) and Mr. Vanderveen' [employer] patient ID [id number]).   She understands that we will have to re-apply in 2019 should this therapy continue.  She voiced appreciation and understanding.   Patient and daughter know to call the office with questions or concerns.  [facility] will continue to follow.  Gilmore Laroche, CPhT, [facility] Patient Advocate [phone] 09/28/2016 10:34 AM

## 2016-09-29 ENCOUNTER — Ambulatory Visit (HOSPITAL_COMMUNITY)
Admission: RE | Admit: 2016-09-29 | Discharge: 2016-09-29 | Disposition: A | Discharge status: Home / Self Care | Payer: Medicare Other | Source: Ambulatory Visit | Attending: Hematology | Admitting: Hematology

## 2016-09-29 DIAGNOSIS — I25810 Atherosclerosis of coronary artery bypass graft(s) without angina pectoris: Secondary | ICD-10-CM

## 2016-09-29 DIAGNOSIS — I5189 Other ill-defined heart diseases: Secondary | ICD-10-CM | POA: Insufficient documentation

## 2016-09-29 NOTE — Progress Notes (Signed)
  Echocardiogram 2D Echocardiogram has been performed.  Darlina Sicilian M 09/29/2016, 10:46 AM

## 2016-10-04 ENCOUNTER — Ambulatory Visit: Payer: Medicare Other | Admitting: Nurse Practitioner

## 2016-10-04 ENCOUNTER — Ambulatory Visit: Payer: Medicare Other

## 2016-10-04 ENCOUNTER — Other Ambulatory Visit: Payer: Medicare Other

## 2016-10-05 ENCOUNTER — Encounter (HOSPITAL_COMMUNITY): Payer: Self-pay

## 2016-10-05 ENCOUNTER — Ambulatory Visit (HOSPITAL_COMMUNITY)
Admission: RE | Admit: 2016-10-05 | Discharge: 2016-10-05 | Disposition: A | Discharge status: Home / Self Care | Payer: Medicare Other | Source: Ambulatory Visit | Attending: Radiation Oncology | Admitting: Radiation Oncology

## 2016-10-05 ENCOUNTER — Telehealth: Payer: Self-pay | Admitting: Hematology

## 2016-10-05 DIAGNOSIS — C7931 Secondary malignant neoplasm of brain: Secondary | ICD-10-CM

## 2016-10-05 DIAGNOSIS — C439 Malignant melanoma of skin, unspecified: Secondary | ICD-10-CM | POA: Diagnosis not present

## 2016-10-05 MED ORDER — IOPAMIDOL (ISOVUE-300) INJECTION 61%
INTRAVENOUS | Status: AC
  Administered 2016-10-05: 75 mL via INTRAVENOUS
  Filled 2016-10-05: qty 75

## 2016-10-05 MED ORDER — IOPAMIDOL (ISOVUE-300) INJECTION 61%
75.0000 mL | Freq: Once | INTRAVENOUS | Status: AC | PRN
  Administered 2016-10-05: 75 mL via INTRAVENOUS

## 2016-10-05 NOTE — Telephone Encounter (Signed)
Appointments scheduled and confirmed with kelly, patient's daughter. Appointment letter and schedule also mailed to patient.

## 2016-10-09 ENCOUNTER — Ambulatory Visit
Admission: RE | Admit: 2016-10-09 | Discharge: 2016-10-09 | Disposition: A | Discharge status: Home / Self Care | Payer: Medicare Other | Source: Ambulatory Visit | Attending: Radiation Oncology | Admitting: Radiation Oncology

## 2016-10-09 DIAGNOSIS — K219 Gastro-esophageal reflux disease without esophagitis: Secondary | ICD-10-CM | POA: Insufficient documentation

## 2016-10-09 DIAGNOSIS — Z9889 Other specified postprocedural states: Secondary | ICD-10-CM | POA: Insufficient documentation

## 2016-10-09 DIAGNOSIS — Z85820 Personal history of malignant melanoma of skin: Secondary | ICD-10-CM | POA: Insufficient documentation

## 2016-10-09 DIAGNOSIS — Z79899 Other long term (current) drug therapy: Secondary | ICD-10-CM | POA: Insufficient documentation

## 2016-10-09 DIAGNOSIS — I10 Essential (primary) hypertension: Secondary | ICD-10-CM | POA: Insufficient documentation

## 2016-10-09 DIAGNOSIS — C439 Malignant melanoma of skin, unspecified: Secondary | ICD-10-CM | POA: Insufficient documentation

## 2016-10-09 DIAGNOSIS — Z8249 Family history of ischemic heart disease and other diseases of the circulatory system: Secondary | ICD-10-CM | POA: Insufficient documentation

## 2016-10-09 DIAGNOSIS — Z7982 Long term (current) use of aspirin: Secondary | ICD-10-CM | POA: Insufficient documentation

## 2016-10-09 DIAGNOSIS — C7931 Secondary malignant neoplasm of brain: Secondary | ICD-10-CM | POA: Insufficient documentation

## 2016-10-09 DIAGNOSIS — Z87891 Personal history of nicotine dependence: Secondary | ICD-10-CM | POA: Insufficient documentation

## 2016-10-09 DIAGNOSIS — Z8673 Personal history of transient ischemic attack (TIA), and cerebral infarction without residual deficits: Secondary | ICD-10-CM | POA: Insufficient documentation

## 2016-10-09 DIAGNOSIS — Z801 Family history of malignant neoplasm of trachea, bronchus and lung: Secondary | ICD-10-CM | POA: Insufficient documentation

## 2016-10-10 ENCOUNTER — Other Ambulatory Visit: Payer: Medicare Other

## 2016-10-10 ENCOUNTER — Ambulatory Visit: Payer: Medicare Other | Admitting: Hematology

## 2016-10-16 ENCOUNTER — Telehealth: Payer: Self-pay | Admitting: Radiation Oncology

## 2016-10-16 ENCOUNTER — Ambulatory Visit
Admission: RE | Admit: 2016-10-16 | Discharge: 2016-10-16 | Disposition: A | Discharge status: Home / Self Care | Payer: Medicare Other | Source: Ambulatory Visit | Attending: Radiation Oncology | Admitting: Radiation Oncology

## 2016-10-16 VITALS — BP 136/99 | HR 79 | Resp 18 | Wt 199.6 lb

## 2016-10-16 DIAGNOSIS — Z801 Family history of malignant neoplasm of trachea, bronchus and lung: Secondary | ICD-10-CM | POA: Diagnosis not present

## 2016-10-16 DIAGNOSIS — K219 Gastro-esophageal reflux disease without esophagitis: Secondary | ICD-10-CM | POA: Diagnosis not present

## 2016-10-16 DIAGNOSIS — Z7982 Long term (current) use of aspirin: Secondary | ICD-10-CM | POA: Diagnosis not present

## 2016-10-16 DIAGNOSIS — Z85820 Personal history of malignant melanoma of skin: Secondary | ICD-10-CM | POA: Diagnosis not present

## 2016-10-16 DIAGNOSIS — I10 Essential (primary) hypertension: Secondary | ICD-10-CM | POA: Diagnosis not present

## 2016-10-16 DIAGNOSIS — Z923 Personal history of irradiation: Secondary | ICD-10-CM | POA: Diagnosis not present

## 2016-10-16 DIAGNOSIS — R413 Other amnesia: Secondary | ICD-10-CM | POA: Diagnosis not present

## 2016-10-16 DIAGNOSIS — Z79899 Other long term (current) drug therapy: Secondary | ICD-10-CM | POA: Diagnosis not present

## 2016-10-16 DIAGNOSIS — C439 Malignant melanoma of skin, unspecified: Secondary | ICD-10-CM | POA: Diagnosis not present

## 2016-10-16 DIAGNOSIS — Z8249 Family history of ischemic heart disease and other diseases of the circulatory system: Secondary | ICD-10-CM | POA: Diagnosis not present

## 2016-10-16 DIAGNOSIS — Z8673 Personal history of transient ischemic attack (TIA), and cerebral infarction without residual deficits: Secondary | ICD-10-CM | POA: Diagnosis not present

## 2016-10-16 DIAGNOSIS — C7931 Secondary malignant neoplasm of brain: Secondary | ICD-10-CM

## 2016-10-16 DIAGNOSIS — Z87891 Personal history of nicotine dependence: Secondary | ICD-10-CM | POA: Diagnosis not present

## 2016-10-16 DIAGNOSIS — Z9889 Other specified postprocedural states: Secondary | ICD-10-CM | POA: Diagnosis not present

## 2016-10-16 DIAGNOSIS — E291 Testicular hypofunction: Secondary | ICD-10-CM | POA: Diagnosis not present

## 2016-10-16 MED ORDER — DEXAMETHASONE 4 MG PO TABS: 4.0000 mg | ORAL_TABLET | Freq: Every day | ORAL | 2 refills | Status: DC

## 2016-10-16 NOTE — Progress Notes (Addendum)
[name] is here today for follow up.  Patient reports feeling very unlike himself.  He states "I'm just not myself",  Denies having any pain, but does report feeling very slow.  Patient states that he memory is rapidly declining.  He is very concerned about it.  States he can be driving and forgets if he went past somewhere on the way to where he is going.  He also reports that he lives with his wife who also has memory issues.  Patient reports fatigue.  He does report having weakness in both upper and lower extremities and he reports he feels like he needs to start using an assistive device to get around to prevent fall.  Patient states that all of these symptoms have worsened since starting the last two medications that Dr. Burr Medico prescribed ([place]is).  He is unsure which might be contributing.  Clarity of exact drug name was also a challenge for the patient to recall.  Denies having an shortness of breath.  Reports no issues with appetite.   Patient denies having any headaches.   Vitals:   10/16/16 0830  BP: (!) 136/99  Pulse: 79  Resp: 18  SpO2: 94%  Weight: 199 lb 9.6 oz (90.5 kg)    Unable to obtain reading for temperature.   Wt Readings from Last 3 Encounters:  10/16/16 199 lb 9.6 oz (90.5 kg)  09/20/16 200 lb 11.2 oz (91 kg)  09/06/16 204 lb 8 oz (92.8 kg)

## 2016-10-16 NOTE — Telephone Encounter (Signed)
Per Dr. Johny Shears order called in decadron 4 mg tablet once daily to [facility] in [place], [place].

## 2016-10-16 NOTE — Addendum Note (Signed)
Encounter addended by: Heywood Footman, RN on: 10/16/2016 10:49 AM<BR>    Actions taken: Charge Capture section accepted

## 2016-10-16 NOTE — Progress Notes (Signed)
Radiation Oncology         [phone]) [phone] ________________________________  Name: David [name] MRN: [id number]  Date: 10/16/2016  DOB: 1940/07/03  Follow-Up Visit Note  CC: David Poot, MD  David Merle, MD  Diagnosis:  76 y.o. gentleman with metastatic brain disease from melanoma.    ICD-10-CM   1. Metastasis to brain [facility]) C79.31     Interval Since Last Radiation: 8 months  01/27/16 SRS Treatment:  20 Gy in 1 fraction to a 6 mm right cerebellar brain met   10/13/2015 Preop SRS treatment: 14 Gy  to the left frontal lesion in 1 fraction  Narrative:  In summary this is a pleasant 76 y.o. gentleman with a history of metastatic melanoma treated on two occasions with SRS to the brain, currently undergoing systemic treatment with David. Burr [name] for systemic treatment of his disease. He has developed hypothyroidism as a result of his immunotherapy. He developed difficulty with word recall and word finding and he did have a CT brain on 07/20/2016 revealing left frontal vasogenic edema with stability in the enhancement of the previously treated resection site. There was new enhancement along the lateral ventricle measuring about 3.2 cm. This was concerning per our discussion in conference for radionecrosis. CT scan of the head on 09/02/2016 showed a somewhat mixed pattern of slight posterior progression of pleomorphic enhancement surrounding the left lateral ventricle, although no significant mass effect, and reduced vasogenic edema compared with the scan in April.  Immunotherapy was discontinued and he has been receiving steroids.  CT scan of the head on 10/05/2016 showed decreased size of the enhancing area within the left frontal lobe treatment site with decreased surrounding edema. These findings supported continued evolution of radiation necrosis. There were no new enhancing lesions.  RN notes:  Today the patient presents for a follow up. On review of systems, he reports feeling very unlike himself,  stating, "I'm just not myself."  He denies having any pain, but does report feeling very slow.  He states that his memory is rapidly declining and is very concerned about it.  He states he can be driving and forgets if he went past somewhere on the way to where he is going.  He also reports that he lives with his wife who also has memory issues. He reports fatigue, weakness in both upper and lower extremities, and feels like he needs to start using an assistive device to get around to prevent fall. He states that all of these symptoms have worsened since starting the last two medications that David. Burr [name] prescribed (David [name]).  He is unsure which might be contributing.  Clarity of exact drug name was also a challenge for the patient to recall.  He denies having shortness of breath, headaches, pain/discomfort with swallowing, or issues with appetite. A complete review of systems is obtained and is otherwise negative.  Past Medical History:  Past Medical History:  Diagnosis Date  . Anginal pain ([place])   . Arthritis    mostly hands  . Benign familial hematuria   . BPH (benign prostatic hypertrophy)   . Brain cancer ([place])    melanoma with brain met  . Coronary artery disease   . GERD (gastroesophageal reflux disease)   . High cholesterol   . Hypertension   . Hypothyroidism   . Intracerebral hemorrhage ([place])   . Melanoma ([place])   . Metastatic melanoma to lung ([place])   . Mood disorder ([place])   . Myocardial infarction ([place])   .  Pacemaker    due to syncope, 3rd degree HB (upgrade to [facility]. Jude CRT-P 02/25/13 (David [name])  . Rectal bleed    due to NSAIDS  . Renal disorder   . Skin cancer    melanoma  . Sleep apnea    uses CPAP  . Stroke [facility])     Past Surgical History: Past Surgical History:  Procedure Laterality Date  . APPLICATION OF CRANIAL NAVIGATION N/A 10/15/2015   Procedure: APPLICATION OF CRANIAL NAVIGATION;  Surgeon: David Levine, MD;  Location: [place] NEURO ORS;  Service:  Neurosurgery;  Laterality: N/A;  . CARDIAC CATHETERIZATION  2013  . CARDIAC SURGERY     bypass X 2  . COLONOSCOPY WITH PROPOFOL N/A 05/08/2016   Procedure: COLONOSCOPY WITH PROPOFOL;  Surgeon: David Bellows, MD;  Location: [facility] ENDOSCOPY;  Service: Gastroenterology;  Laterality: N/A;  . CORONARY ARTERY BYPASS GRAFT  2013   LIMA-LAD, SVG-PDA 03/27/11 (David. Francee [name])  . CRANIOTOMY N/A 10/15/2015   Procedure: LEFT FRONTAL CRANIOTOMY TUMOR EXCISION with Curve;  Surgeon: David Levine, MD;  Location: [place] NEURO ORS;  Service: Neurosurgery;  Laterality: N/A;  CRANIOTOMY TUMOR EXCISION  . EYE SURGERY    . FOOT SURGERY  08/2010  . JOINT REPLACEMENT Left    partial knee  . KNEE ARTHROSCOPY  12/2008  . LYMPH NODE BIOPSY    . PACEMAKER INSERTION    . TONSILLECTOMY      Social History:  Social History   Social History  . Marital status: Married    Spouse name: N/A  . Number of children: 2  . Years of education: N/A   Occupational History  . retired Pharmacist, community    Social History Main Topics  . Smoking status: Former Smoker    Packs/day: 1.00    Years: 3.00    Quit date: 04/11/1919  . Smokeless tobacco: Never Used  . Alcohol use 24.0 oz/week    30 Glasses of wine, 10 Cans of beer per week     Comment: 10 per week, wine per week  . Drug use: No  . Sexual activity: No   Other Topics Concern  . Not on file   Social History Narrative   Wife has dementia   Daughter David [name] and son      Has living will   Daughter David [name] is health care POA   Requests DNR-- done 09/14/15   Not sure about tube feeds    Family History: Family History  Problem Relation Age of Onset  . Cancer Mother 80       lung   . Hypertension Mother   . Hypertension Father   . Heart attack Father   . Heart disease Father   . Rheum arthritis Sister               ALLERGIES:  is allergic to zocor [simvastatin].  Meds: Current Outpatient Prescriptions  Medication Sig Dispense Refill  . acetaminophen (TYLENOL) 325  MG tablet Take 2 tablets (650 mg total) by mouth every 6 (six) hours as needed for mild pain. (Patient taking differently: Take 650 mg by mouth 2 (two) times daily. )    . amLODipine (NORVASC) 2.5 MG tablet Take 2.5 mg by mouth daily.    . ASPIRIN LOW DOSE 81 MG EC tablet Take 81 mg by mouth daily  11  . carvedilol (COREG) 6.25 MG tablet Take 6.25 mg by mouth 2 (two) times daily with a meal.    . dabrafenib mesylate (TAFINLAR) 75 MG  capsule Take 2 capsules (150 mg total) by mouth 2 (two) times daily. Take on an empty stomach 1 hour before or 2 hours after meals. 120 capsule 1  . dexamethasone (DECADRON) 4 MG tablet Take 1 tablet (4 mg total) by mouth daily. 14 tablet 0  . levothyroxine (SYNTHROID, LEVOTHROID) 175 MCG tablet Take 175 mcg by mouth daily before breakfast.    . Multiple Vitamin (MULTIVITAMIN WITH MINERALS) TABS tablet Take 1 tablet by mouth daily.    . pentoxifylline (TRENTAL) 400 MG CR tablet Take 1 tablet (400 mg total) by mouth 2 (two) times daily. 60 tablet 6  . rosuvastatin (CRESTOR) 10 MG tablet Take 10 mg by mouth at bedtime.    . sertraline (ZOLOFT) 50 MG tablet Take 100 mg by mouth daily.     Marland Kitchen testosterone cypionate (DEPOTESTOTERONE CYPIONATE) 100 MG/ML injection Inject 100 mg into the muscle every 14 (fourteen) days.     . trametinib dimethyl sulfoxide (MEKINIST) 2 MG tablet Take 1 tablet (2 mg total) by mouth daily. Take 1 hour before or 2 hours after a meal. Store refrigerated in original container. 30 tablet 1  . vitamin E (VITAMIN E) 400 UNIT capsule Take 1 capsule (400 Units total) by mouth 2 (two) times daily. 60 capsule 6  . ferrous sulfate 325 (65 FE) MG tablet Take 1 tablet (325 mg total) by mouth daily with breakfast. (Patient not taking: Reported on 10/16/2016) 30 tablet 0  . PRESCRIPTION MEDICATION Inhale into the lungs at bedtime. CPAP     No current facility-administered medications for this encounter.    Facility-Administered Medications Ordered in Other  Encounters  Medication Dose Route Frequency Provider Last Rate Last Dose  . 0.9 %  sodium chloride infusion  1,000 mL Intravenous Once David Merle, MD        Physical Findings:   weight is 199 lb 9.6 oz (90.5 kg). His blood pressure is 136/99 (abnormal) and his pulse is 79. His respiration is 18 and oxygen saturation is 94%. . In general this is a well appearing caucasian male in no acute distress. He's alert and oriented x4 and appropriate throughout the examination. HEENT reveals that he is normocephalic, atraumatic, and shows no oral thrush.  Lab Findings: Lab Results  Component Value Date   WBC 8.6 09/20/2016   WBC 10.0 09/02/2016   HGB 17.1 09/20/2016   HCT 49.4 09/20/2016   PLT 92 (L) 09/20/2016    Lab Results  Component Value Date   NA 136 09/20/2016   K 5.0 09/20/2016   CHLORIDE 103 09/20/2016   CO2 25 09/20/2016   GLUCOSE 85 09/20/2016   BUN 37.0 (H) 09/20/2016   CREATININE 1.1 09/20/2016   BILITOT 0.66 09/20/2016   ALKPHOS 56 09/20/2016   AST 24 09/20/2016   ALT 47 09/20/2016   PROT 6.2 (L) 09/20/2016   ALBUMIN 3.3 (L) 09/20/2016   CALCIUM 9.4 09/20/2016   ANIONGAP 8 09/20/2016   ANIONGAP 9 09/02/2016    Radiographic Findings: Ct Head W Wo Contrast  Result Date: 10/05/2016 CLINICAL DATA:  Melanoma. Stereotactic radiosurgery. Evaluation for radiation necrosis. EXAM: CT HEAD WITHOUT AND WITH CONTRAST TECHNIQUE: Contiguous axial images were obtained from the base of the skull through the vertex without and with intravenous contrast CONTRAST:  37m ISOVUE-300 IOPAMIDOL (ISOVUE-300) INJECTION 61% COMPARISON:  Head CT 09/02/2016, 07/20/2016, 04/20/2016 FINDINGS: Brain: The area of hypoattenuation in the left frontal lobe white matter has slightly decreased in size. The areas of internal enhancement have also  decreased. The largest continuous area of enhancement now measures 2.3 cm, previously 3.0 cm. Enhancement abutting the frontal horn of the left lateral ventricle is  also slightly decreased. No remote enhancing lesions are identified. No acute intracranial hemorrhage or evidence of acute infarct. No midline shift or other significant mass effect. Vascular: No hyperdense vessel or unexpected calcification. Skull: Remote left pterional craniotomy. Sinuses/Orbits: No sinus fluid levels or advanced mucosal thickening. No mastoid effusion. Bilateral lens replacements and left scleral banding. IMPRESSION: 1. Decreased size of the enhancing area within the left frontal lobe treatment site. Surrounding edema has also decreased. The findings support continued evolution of radiation necrosis. 2. No new enhancing lesions. Electronically Signed   By: Ulyses Jarred M.D.   On: 10/05/2016 13:47    Impression/Plan: 1. Metastatic Melanoma. CT scan shows improvement but the patient is reporting forgetfulness. I will increase dexamethasone from 2 mg to 4 mg per day and then assess his response.  Follow-up head CT in 2-3 months.   Sheral Apley Tammi Klippel, M.D.  This document serves as a record of services personally performed by Tyler Pita, MD. It was created on his behalf by Rae Lips, a trained medical scribe. The creation of this record is based on the scribe's personal observations and the provider's statements to them. This document has been checked and approved by the attending provider.

## 2016-10-16 NOTE — Addendum Note (Signed)
Encounter addended by: Heywood Footman, RN on: 10/16/2016  4:20 PM<BR>    Actions taken: Order Reconciliation Section accessed, Order list changed

## 2016-10-17 NOTE — Progress Notes (Signed)
David [name]  Telephone:(336) [phone] Fax:(336) [phone]  Clinic Follow up Note   Patient Care Team: [name], MD as PCP - General (Internal Medicine) 10/19/2016  Chief complaint: Follow-up metastatic melanoma  Oncology History   Metastatic melanoma [facility])   Staging form: Melanoma of the Skin, AJCC 7th Edition     Clinical stage from 09/21/2015: Stage IV [place], [place], [place]) - Signed by David Merle, MD on 10/09/2015       Metastatic melanoma ([place])   09/16/2015 Imaging    PET scan showed a hypermetabolic 3.7GB lingular nodule, and a 65m right upper lobe lung nodule, left subclavicular nodes, and bone metastasis in the proximal right humerus and right femur.       09/21/2015 Initial Diagnosis    [place]astatic [place]      09/21/2015 Initial Biopsy    Left subclavicular lymph node biopsy showed prostatic of melanoma      09/21/2015 Miscellaneous    BRAF V600K mutation (+)       10/04/2015 Imaging    CT head with without contrast showed a 2.0 x 1.6 x 1.7 cm superficial left frontal hyperdense lesion, with postcontrast enhancement, lying within the previous hemorrhagic area, concerning for solitary melanoma metastasis      10/06/2015 - 09/06/2016 Chemotherapy    Nivolumab '240mg'$  every 2 weeks      10/13/2015 - 10/13/2015 Radiation Therapy    10/13/2015 Preop SRS treatment: 14 Gy  to the left frontal lesion in 1 fraction      10/15/2015 Surgery    left front craniotomy and tumor excision       10/15/2015 Pathology Results    left frontal brain mass excision showed metastatic melanoma, 1.5X1.5X0.6cm      01/13/2016 Imaging    CT head without and with contrast showed ill-defined enhancement and dural thickening of the left frontal resection cavity may represent a post treatment changes or residual neoplasm. A new 6 mm hypodensity in the right cerebellar hemisphere with uncertainty enhancement may represent a metastasis or small brain parenchymal hemorrhage.       01/27/2016 - 01/27/2016 Radiation Therapy    01/27/16 SRS Treatment:  20 Gy in 1 fraction to a 6 mm right cerebellar brain met       03/29/2016 PET scan    PET 03/29/2016 IMPRESSION: 1. Overall there has been an interval response to therapy. The index lesions sided on previous exam it are less hypermetabolic when compared with the previous exam. 2. There is an intramuscular lesion within the right masseter which has increased FDG uptake compared with previous exam. 3. No new sites of disease identified. 4. Aortic atherosclerosis and infrarenal abdominal aortic aneurysm. Recommend followup by ultrasound in 3 years. This recommendation follows ACR consensus guidelines: White Paper of the ACR Incidental Findings Committee II on Vascular Findings. JJoellyn RuedRadiol 2013; 10:789-794      04/20/2016 Imaging    CT Head w wo contrast 1. Mild patchy enhancement at the left anterior frontal lobe treatment site (series 11, image 39) without mass effect and stable surrounding white matter hypodensity without strong evidence of acute vasogenic edema. Recommend continued CT surveillance. 2. No new metastatic disease or new intracranial abnormality identified. 3. Probable benign 14 mm left parietal bone lucent lesion, unchanged since May 2017.      05/06/2016 - 05/08/2016 Hospital Admission    Pt was admitted for rectal bleeding for one day. Breathing spontaneously resolved, hemoglobin dropped from 15 to 10.5, did not  require blood transfusion. Colonoscopy showed no active bleeding, except diverticulosis and hemorrhoid. He was discharged to home.      05/08/2016 Procedure    Colonoscopy  - Stool in the ascending colon. Fluid aspiration performed. - Diverticulosis in the entire examined colon. - Non-bleeding internal hemorrhoids. - The examination was otherwise normal on direct and retroflexion views.      05/29/2016 Imaging    PET Image Restaging IMPRESSION: 1. Stable exam with no evidence  progression. 2. Photopenia in the LEFT frontal lobe at prior surgical site. 3. Stable mildly hypermetabolic RIGHT paratracheal node. This may represent a thyroid nodule. 4. Stable size of minimally metabolic lingular pulmonary nodule. 5. Stable skeletal lesion of the RIGHT iliac crest 6. No cutaneous metabolic lesion.      07/20/2016 Imaging    CT Head W WO Contrast IMPRESSION: Pronounced change in the left frontal region. Much more regional vasogenic edema. Relatively stable appearance of the abnormal enhancement extending from the lateral margin of the frontal horn of left lateral ventricle to the frontal brain surface. New region of abnormal enhancement surrounding the frontal horn of the left lateral ventricle measuring approximately 3.2 cm. The differential diagnosis is recurrent tumor versus radiation necrosis. The patient is reportedly not an MRI candidate. This area is large enough that CT perfusion could possibly make a reasonable differentiation.      09/02/2016 Imaging    CT Head 09/02/16 IMPRESSION: Somewhat mixed pattern of slight posterior progression of pleomorphic enhancement surrounding the LEFT lateral ventricle although no significant mass effect, and reduced vasogenic edema compared with the scan in April.       Chemotherapy    Start dabrafenib and trametinib 09/27/16       10/05/2016 Imaging    CT Head w & w/o contrast  IMPRESSION: 1. Decreased size of the enhancing area within the left frontal lobe treatment site. Surrounding edema has also decreased. The findings support continued evolution of radiation necrosis. 2. No new enhancing lesions.      10/18/2016 PET scan    IMPRESSION: 1. New focal hypermetabolism in the proximal sigmoid colon with associated focal colonic wall thickening and mild pericolonic fat stranding at the site of a proximal sigmoid diverticulum, favored to represent mild acute sigmoid diverticulitis. Metastatic disease  is considered less likely. Clinical correlation is necessary. Consider appropriate antibiotic therapy, as clinically warranted. Attention to this region recommended on future PET-CT follow-up. 2. Otherwise complete metabolic response with no evidence of hypermetabolic metastatic disease. Lingular pulmonary nodule is slightly decreased in size and demonstrates no significant metabolism. 3. No residual hypermetabolism in the right thyroid lobe nodule, which is stable in size. 4. Aortic Atherosclerosis (ICD10-I70.0). Stable 3.1 cm infrarenal abdominal aortic aneurysm. Aortic aneurysm NOS (ICD10-I71.9). Recommend followup by ultrasound in 3 years. This recommendation follows ACR consensus guidelines: White Paper of the ACR Incidental Findings Committee II on Vascular Findings. J Am Coll Radiol 2013; 10:789-794.       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 He was found to have left frontal hemorrhage 5.7 cm x 4.5 cm with 6 mm left to right shift.During the workup for his stroke, a left upper lung mass was noticed on the CT scan. I was called to evaluate his lung mass.  Patient has aphasia, not able to communicate much. According to his daughter, Dr. Gala [name], who is a OB GYN at [employer], he probably only smoked for very short period time during the college. He did have 3 skin melanoma, which was removed, last one was 2-3 years ago. No other cancer history.  CURRENT THERAPY: Nivolumab 240 mg, every 2 weeks, started on 10/06/2015 changed to dabrafenib and trametinib 10/04/16  INTERVAL HISTORY:  Mr. Harner returns for follow-up. He is now on dabrafenib and trametinib therapy for 2 weeks now. He is having some issues including fatigue. This causes him to avoid doing some things.He has had a slight change in appetite. He denies any nausea, changes in bowel movement  change, or pain. He reports his memory is not improving. Her has no other concerns. He denies abdominal pain or diarrhea. No fever or chills.  REVIEW OF SYSTEMS:  Constitutional: Denies fevers, chills or abnormal weight loss, (+) fatigue (+) confusion Eyes: Denies blurriness of vision Ears, nose, mouth, throat, and face: Denies mucositis or sore throat Respiratory: Denies cough, dyspnea or wheezes Cardiovascular: Denies palpitation, chest discomfort or lower extremity swelling Gastrointestinal:  Denies nausea, heartburn, hematochezia  Skin: (+) mild rash on bilateral upper extremities which is mildly tender Lymphatics: Denies new lymphadenopathy or easy bruising Neurological:see HPI  Behavioral/Psych: Mood is stable, no new changes  All other systems were reviewed with the patient and are negative.  MEDICAL HISTORY:  Past Medical History:  Diagnosis Date   Anginal pain ([place])    Arthritis    mostly hands   Benign familial hematuria    BPH (benign prostatic hypertrophy)    Brain cancer (HCC)    melanoma with brain met   Coronary artery disease    GERD (gastroesophageal reflux disease)    High cholesterol    Hypertension    Hypothyroidism    Intracerebral hemorrhage (HCC)    Melanoma ([place])    Metastatic melanoma to lung (HCC)    Mood disorder ([place])    Myocardial infarction [facility])    Pacemaker    due to syncope, 3rd degree HB (upgrade to [employer] CRT-P 02/25/13 (Dr. Uvaldo Rising)   Rectal bleed    due to NSAIDS   Renal disorder    Skin cancer    melanoma   Sleep apnea    uses CPAP   Stroke [facility])     SURGICAL HISTORY: Past Surgical History:  Procedure Laterality Date   APPLICATION OF CRANIAL NAVIGATION N/A 10/15/2015   Procedure: APPLICATION OF CRANIAL NAVIGATION;  Surgeon: Erline Levine, MD;  Location: [place] NEURO ORS;  Service: Neurosurgery;  Laterality: N/A;   CARDIAC CATHETERIZATION  2013   CARDIAC SURGERY     bypass X 2   COLONOSCOPY WITH PROPOFOL N/A  05/08/2016   Procedure: COLONOSCOPY WITH PROPOFOL;  Surgeon: Jonathon Bellows, MD;  Location: [facility] ENDOSCOPY;  Service: Gastroenterology;  Laterality: N/A;   CORONARY ARTERY BYPASS GRAFT  2013   LIMA-LAD, SVG-PDA 03/27/11 (Dr. Francee Gentile)   [place] N/A 10/15/2015   Procedure: LEFT FRONTAL CRANIOTOMY TUMOR EXCISION with Curve;  Surgeon: Erline Levine, MD;  Location: [place] [facility]UR[facility] ORS;  Service: Neurosurgery;  Laterality: N/A;  CRANIOTOMY TUMOR EXCISION   EYE SURGERY     FOOT SURGERY  08/2010   JOINT REPLACEMENT Left    partial knee   KNEE ARTHROSCOPY  12/2008   LYMPH NODE BIOPSY     PACEMAKER INSERTION     TONSILLECTOMY      I have reviewed the social history and family history with the patient and they are unchanged from previous note.  ALLERGIES:  is allergic to zocor [  simvastatin].  MEDICATIONS:  Current Outpatient Prescriptions  Medication Sig Dispense Refill   acetaminophen (TYLENOL) 325 MG tablet Take 2 tablets (650 mg total) by mouth every 6 (six) hours as needed for mild pain. (Patient taking differently: Take 650 mg by mouth 2 (two) times daily. )     amLODipine (NORVASC) 2.5 MG tablet Take 2.5 mg by mouth daily.     ASPIRIN LOW DOSE 81 MG EC tablet Take 81 mg by mouth daily  11   carvedilol (COREG) 6.25 MG tablet Take 6.25 mg by mouth 2 (two) times daily with a meal.     dabrafenib mesylate (TAFINLAR) 75 MG capsule Take 2 capsules (150 mg total) by mouth 2 (two) times daily. Take on an empty stomach 1 hour before or 2 hours after meals. 120 capsule 1   dexamethasone (DECADRON) 4 MG tablet Take 1 tablet (4 mg total) by mouth daily. 30 tablet 2   ferrous sulfate 325 (65 FE) MG tablet Take 1 tablet (325 mg total) by mouth daily with breakfast. 30 tablet 0   levothyroxine (SYNTHROID, LEVOTHROID) 175 MCG tablet Take 175 mcg by mouth daily before breakfast.     Multiple Vitamin (MULTIVITAMIN WITH MINERALS) TABS tablet Take 1 tablet by mouth daily.     pentoxifylline (TRENTAL)  400 MG CR tablet Take 1 tablet (400 mg total) by mouth 2 (two) times daily. 60 tablet 6   PRESCRIPTION MEDICATION Inhale into the lungs at bedtime. CPAP     rosuvastatin (CRESTOR) 10 MG tablet Take 10 mg by mouth at bedtime.     sertraline (ZOLOFT) 50 MG tablet Take 100 mg by mouth daily.      testosterone cypionate (DEPOTESTOTERONE CYPIONATE) 100 MG/ML injection Inject 100 mg into the muscle every 14 (fourteen) days.      trametinib dimethyl sulfoxide (MEKINIST) 2 MG tablet Take 1 tablet (2 mg total) by mouth daily. Take 1 hour before or 2 hours after a meal. Store refrigerated in original container. 30 tablet 1   vitamin E (VITAMIN E) 400 UNIT capsule Take 1 capsule (400 Units total) by mouth 2 (two) times daily. 60 capsule 6   No current facility-administered medications for this visit.    Facility-Administered Medications Ordered in Other Visits  Medication Dose Route Frequency Provider Last Rate Last Dose   0.9 %  sodium chloride infusion  1,000 mL Intravenous Once David Merle, MD        PHYSICAL EXAMINATION:  ECOG PERFORMANCE STATUS: 1  Vitals:   10/19/16 0952  BP: (!) 142/88  Pulse: 82  Resp: 18  Temp: 98.3 F (36.8 C)   Filed Weights   10/19/16 0952  Weight: 200 lb 3.2 oz (90.8 kg)    GENERAL:alert, no distress and comfortable SKIN: skin color, texture, turgor are normal, (+)skin peeling and rash on upper chest and shoulder area EYES: normal, Conjunctiva are pink and non-injected, sclera clear OROPHARYNX:no exudate, no erythema and lips, buccal mucosa, and tongue normal  NECK: supple, thyroid normal size, non-tender, without nodularity LYMPH:  no palpable lymphadenopathy in the cervical, Menan, axillary or inguinal LUNGS: clear to auscultation and percussion with normal breathing effort HEART: regular rate & rhythm and no murmurs and no lower extremity edema ABDOMEN:abdomen soft, non-tender and normal bowel sounds Musculoskeletal:no cyanosis of digits and no clubbing   NEURO: alert & oriented x 3 with fluent speech, no focal motor/sensory deficits, he walks independently, gait is normal   LABORATORY DATA:  I have reviewed the data as listed  CBC Latest Ref Rng & Units 10/19/2016 09/20/2016 09/06/2016  WBC 4.0 - 10.3 10e3/uL 5.1 8.6 7.6  Hemoglobin 13.0 - 17.1 g/dL 16.4 17.1 16.0  Hematocrit 38.4 - 49.9 % 47.7 49.4 46.9  Platelets 140 - 400 10e3/uL 161 92(L) 179     CMP Latest Ref Rng & Units 09/20/2016 09/06/2016 09/02/2016  Glucose 70 - 140 mg/dl 85 211(H) 105(H)  BUN 7.0 - 26.0 mg/dL 37.0(H) 34.5(H) 31(H)  Creatinine 0.7 - 1.3 mg/dL 1.1 1.2 1.40(H)  Sodium 136 - 145 mEq/L 136 137 138  Potassium 3.5 - 5.1 mEq/L 5.0 4.7 4.2  Chloride 101 - 111 mmol/L - - 103  CO2 22 - 29 mEq/L 25 21(L) -  Calcium 8.4 - 10.4 mg/dL 9.4 9.5 -  Total Protein 6.4 - 8.3 g/dL 6.2(L) 6.7 -  Total Bilirubin 0.20 - 1.20 mg/dL 0.66 0.41 -  Alkaline Phos 40 - 150 U/L 56 53 -  AST 5 - 34 U/L 24 28 -  ALT 0 - 55 U/L 47 32 -   PATHOLOGY REPORT: Diagnosis 09/21/2015 Lymph node, needle/core biopsy, hypermetabolic left sub clavicular LN - METASTATIC MALIGNANT MELANOMA. - SEE COMMENT.  Diagnosis 10/15/2015 Brain, for tumor resection, Left frontal - METASTATIC MELANOMA.     PROCEDURES: Colonoscopy 09/29/2016 - all normal findings  Colonoscopy 05/08/16 - Stool in the ascending colon. Fluid aspiration performed. - Diverticulosis in the entire examined colon. - Non-bleeding internal hemorrhoids. - The examination was otherwise normal on direct and retroflexion views.  RADIOGRAPHIC STUDIES: I have personally reviewed the radiological images as listed and agreed with the findings in the report. Nm Pet Image Restage (ps) Whole Body  Result Date: 10/18/2016 CLINICAL DATA:  Subsequent treatment strategy for metastatic melanoma. EXAM: NUCLEAR MEDICINE PET WHOLE BODY TECHNIQUE: 11.0 mCi F-18 FDG was injected intravenously. Full-ring PET imaging was performed from the vertex to the  feet after the radiotracer. CT data was obtained and used for attenuation correction and anatomic localization. FASTING BLOOD GLUCOSE:  Value: 101 mg/dl COMPARISON:  05/29/2016 PET-CT. FINDINGS: HEAD/NECK No hypermetabolic activity in the scalp. No hypermetabolic cervical lymph nodes. Stable left frontal encephalomalacia with overlying left frontal craniotomy. No residual hypermetabolism in the 1.5 cm right thyroid lobe nodule, which is stable in size (series 3/image 83). CHEST No enlarged or hypermetabolic axillary, mediastinal or hilar lymph nodes. Solid 0.8 cm lingular pulmonary nodule (series 8/image 48) is non hypermetabolic (max SUV 0.6), previously 1.0 cm, slightly decreased in size. No acute consolidative airspace disease or additional significant pulmonary nodules. Stable mild cardiomegaly. Stable configuration of 3 lead left subclavian ICD with lead tips in the right atrium, right ventricular apex and coronary sinus. Left main and 3 vessel coronary atherosclerosis status post CABG. Atherosclerotic nonaneurysmal thoracic aorta. Stable top-normal caliber pulmonary arteries. No pneumothorax. No pleural effusions. Intact sternotomy wires. ABDOMEN/PELVIS Marked diffuse colonic diverticulosis. There is new focal hypermetabolism (max SUV 10.0) localizing to the proximal sigmoid colon, with associated focal colonic wall thickening and mild pericolonic fat stranding at the site of a proximal sigmoid diverticulum (series 3/image 191), most suggestive of mild acute sigmoid diverticulitis. No abnormal hypermetabolic activity within the liver, pancreas, adrenal glands, or spleen. No hypermetabolic lymph nodes in the abdomen or pelvis. Simple 2.3 cm interpolar right renal cyst. Atherosclerotic abdominal aorta with stable 3.1 cm infrarenal abdominal aortic aneurysm. SKELETON No focal hypermetabolic activity to suggest skeletal metastasis. EXTREMITIES Stable mild degenerative uptake in the region of the right first  metatarsophalangeal joint. No abnormal hypermetabolic activity in  the lower extremities. IMPRESSION: 1. New focal hypermetabolism in the proximal sigmoid colon with associated focal colonic wall thickening and mild pericolonic fat stranding at the site of a proximal sigmoid diverticulum, favored to represent mild acute sigmoid diverticulitis. Metastatic disease is considered less likely. Clinical correlation is necessary. Consider appropriate antibiotic therapy, as clinically warranted. Attention to this region recommended on future PET-CT follow-up. 2. Otherwise complete metabolic response with no evidence of hypermetabolic metastatic disease. Lingular pulmonary nodule is slightly decreased in size and demonstrates no significant metabolism. 3. No residual hypermetabolism in the right thyroid lobe nodule, which is stable in size. 4. Aortic Atherosclerosis (ICD10-I70.0). Stable 3.1 cm infrarenal abdominal aortic aneurysm. Aortic aneurysm NOS (ICD10-I71.9). Recommend followup by ultrasound in 3 years. This recommendation follows ACR consensus guidelines: White Paper of the ACR Incidental Findings Committee II on Vascular Findings. J Am Coll Radiol 2013; 10:789-794. These results will be called to the ordering clinician or representative by the Radiologist Assistant, and communication documented in the PACS or zVision Dashboard. Electronically Signed   By: Ilona Sorrel M.D.   On: 10/18/2016 17:42     CT Head w & w/o contrast 10/05/2016 IMPRESSION: 1. Decreased size of the enhancing area within the left frontal lobe treatment site. Surrounding edema has also decreased. The findings support continued evolution of radiation necrosis. 2. No new enhancing lesions.   CT Head 09/02/16 IMPRESSION: Somewhat mixed pattern of slight posterior progression of pleomorphic enhancement surrounding the LEFT lateral ventricle although no significant mass effect, and reduced vasogenic edema compared with the scan in  April.  CT Head W WO Contrast 2016/07/22 IMPRESSION: Pronounced change in the left frontal region. Much more regional vasogenic edema. Relatively stable appearance of the abnormal enhancement extending from the lateral margin of the frontal horn of left lateral ventricle to the frontal brain surface. New region of abnormal enhancement surrounding the frontal horn of the left lateral ventricle measuring approximately 3.2 cm. The differential diagnosis is recurrent tumor versus radiation necrosis. The patient is reportedly not an MRI candidate. This area is large enough that CT perfusion could possibly make a reasonable differentiation.  PET Image Restage 05/29/16 IMPRESSION: 1. Stable exam with no evidence progression. 2. Photopenia in the LEFT frontal lobe at prior surgical site. 3. Stable mildly hypermetabolic RIGHT paratracheal node. This may represent a thyroid nodule. 4. Stable size of minimally metabolic lingular pulmonary nodule. 5. Stable skeletal lesion of the RIGHT iliac crest 6. No cutaneous metabolic lesion.   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   1. Metastatic melanoma to lung, node, bone and brain, BRAF V600K mutation (+) -I previously  reviewed his PET/CT scan images with pt and his family members. The scan revealed hypermetabolic left upper lobe lung mass, 2 small right lung nodules, probable bone metastasis, and a subcutaneous left supraclavicular lesion, which is not palpable  -His repeat CT had showed a 2.0 cm left frontal hyperdense lesion with enhancement on contrast, in the area of his recent hemorrhagic stroke, highly suspicious for brain metastasis.  -We previously discussed his left supraclavicular node biopsy results, which showed metastatic melanoma.. -His case was discussed in our sarcoma tumor board. -I previously  recommend a first-line treatment with Nivolumab alone, given his recent stroke, low tumor burden. I'll reserve BRAF inhibitor with or without MEK inhibitor as a second line therapy if he progress on immunotherapy. Other options of  immunotherapy, especially checkpoint inhibitor with ipilumimab, were also discussed with pt and his daughter -He has been tolerating Nivolumab well, no significant side effects except fatigue, Nivo was held after 09/06/2016 due to his needs for steroids for radionecrosis -He is now status post SRS and surgical resection to his solitary brain metastasis on 10/15/15. -I previously reviewed his recent PET scan findings from 05/29/16, which showed stable disease in lung, node and bone. His previous hypermetabolic uptake in the right master muscle has resolved, no other new lesions  -We'll continue Xgeva monthly for his bone metastasis. -He has recently developed neurological symptoms secondary to his radionecrosis, has been on steroids since the end of May 2018. He is currently on 4 mg daily, he will followed up with Dr. Tammi Klippel. -I reviewed her his restaging PET scan from 10/18/2016, which showed complete metabolic response. PET scan also showed focal hypermetabolism in the sigmoid colon, likely diverticulitis, however he is asymptomatic, we'll follow up clinically. -I have started him on dabrafenib and trametinib, since he is off Optivo  -He will need a ECHO and EKG soon and another after starting. He had previous history of coronary artery disease and bypass surgery, he sees a cardiologist at [facility], his daughter prefers him to change his cardiac status to [place]. I'll refer him to Dr. Haroldine Laws.   -previous EKG repeated which shows a right bundle block and paced rhythm,QTc 497.  -He has developed a significant fatigue since he started Dabrafenib and trametinib, I recommend him to stop trametinib for now - I will stop his trametinib for 1 week. If he still has significant  fatigue, I will decrease dabrafenib to '75mg'$  (1tab) twice daily. He will call me in one week to discuss how he feels.   2. Hemorrhagic stroke and brain metastasis, radionecrosis  -I encouraged him to continue physical therapy and occupational therapy. He has recovered very well -History post SRS and surgical resection of his solitary brain metastasis  -His neurology deficit has much improved -He recently received a course of dexamethasone for radionecrosis, but his symptoms recurred after tapering of steroids, he has restarted in late May 2018, currently on 4 mg daily, followed by Dr. Tammi Klippel   3. Fatigue, hypothyroidism and hypotestosteronemia -He has baseline hypothyroidism and hypotestosteronemia -His fatigue has much improved since he restarted testosterone injection by his primary care physician -He will follow-up with his primary care physician Dr. Asher Muir for his Synthroid dose adjustment  -His TSH was slightly In November 2017. TSH 9.6 on 06/15/16. - TSH 25.01 on 08/10/16. -The patient will continue with light exercise as he is able. -I again encouraged him to be active  4. Anemia and history of GI bleeding  -his anemia has resolved now -I previously encouraged him to continue oral iron supplement for a total of 3 months   5. HTN -he is on amlodipine and carvedilol, follow-up of his primary care physician -Due to drug interaction with new oral chemo he iwll ned to monitor his BP more often.   6. Depression  -he is on zoloft '50mg'$  daily, management by his PCP  -improved overall   7. CKD stage III -His Cr has been slightly elevated -I previously encouraged him to drink water adequately -avoid NSAIDs  8. Goal of care discussion  -We previously discussed the incurable nature of his metastatic melanoma, and the overall poor prognosis, especially if he does not have good response to chemotherapy or progress on chemo -The patient understands the goal of care is  palliative. -I  recommend DNR/DNI, he will think about it   9. CAD -2 double vessel bypass -Psychologist, forensic -per pt (her daughter) request, will switch his cardiologist to Dr. Haroldine Laws    Plan -labs today - stop his trametinib for 1 week. He will call in one week with any changes. - lab and f/u in 3 weeks, will repeat EKG on next visit  -I will update his daughter David [name]   All questions were answered. The patient knows to call the clinic with any problems, questions or concerns. No barriers to -learning was detected.  I spent 25 minutes counseling the patient face to face. The total time spent in the appointment was 35 minutes and more than 50% was on counseling and review of test results.  This document serves as a record of services personally performed by David Merle, MD. It was created on her behalf by Brandt Loosen, a trained medical scribe. The creation of this record is based on the scribe's personal observations and the provider's statements to them. This document has been checked and approved by the attending provider.     David Merle, MD 10/19/2016

## 2016-10-18 ENCOUNTER — Ambulatory Visit (HOSPITAL_COMMUNITY)
Admission: RE | Admit: 2016-10-18 | Discharge: 2016-10-18 | Disposition: A | Discharge status: Home / Self Care | Payer: Medicare Other | Source: Ambulatory Visit | Attending: Hematology | Admitting: Hematology

## 2016-10-18 DIAGNOSIS — K5730 Diverticulosis of large intestine without perforation or abscess without bleeding: Secondary | ICD-10-CM | POA: Diagnosis not present

## 2016-10-18 DIAGNOSIS — E041 Nontoxic single thyroid nodule: Secondary | ICD-10-CM | POA: Diagnosis not present

## 2016-10-18 DIAGNOSIS — I700 Atherosclerosis of aorta: Secondary | ICD-10-CM | POA: Insufficient documentation

## 2016-10-18 DIAGNOSIS — C799 Secondary malignant neoplasm of unspecified site: Secondary | ICD-10-CM

## 2016-10-18 DIAGNOSIS — Z9889 Other specified postprocedural states: Secondary | ICD-10-CM | POA: Insufficient documentation

## 2016-10-18 DIAGNOSIS — C439 Malignant melanoma of skin, unspecified: Secondary | ICD-10-CM

## 2016-10-18 DIAGNOSIS — I714 Abdominal aortic aneurysm, without rupture: Secondary | ICD-10-CM | POA: Diagnosis not present

## 2016-10-18 DIAGNOSIS — R911 Solitary pulmonary nodule: Secondary | ICD-10-CM | POA: Diagnosis not present

## 2016-10-18 LAB — GLUCOSE, CAPILLARY: Glucose-Capillary: 101 mg/dL — ABNORMAL HIGH (ref 65–99)

## 2016-10-18 MED ORDER — FLUDEOXYGLUCOSE F - 18 (FDG) INJECTION
11.0000 | Freq: Once | INTRAVENOUS | Status: AC | PRN
  Administered 2016-10-18: 11 via INTRAVENOUS

## 2016-10-19 ENCOUNTER — Telehealth: Payer: Self-pay | Admitting: Hematology

## 2016-10-19 ENCOUNTER — Ambulatory Visit (HOSPITAL_BASED_OUTPATIENT_CLINIC_OR_DEPARTMENT_OTHER): Payer: Medicare Other | Admitting: Hematology

## 2016-10-19 ENCOUNTER — Other Ambulatory Visit (HOSPITAL_BASED_OUTPATIENT_CLINIC_OR_DEPARTMENT_OTHER): Payer: Medicare Other

## 2016-10-19 VITALS — BP 142/88 | HR 82 | Temp 98.3°F | Resp 18 | Ht 69.5 in | Wt 200.2 lb

## 2016-10-19 DIAGNOSIS — R5383 Other fatigue: Secondary | ICD-10-CM | POA: Diagnosis not present

## 2016-10-19 DIAGNOSIS — I10 Essential (primary) hypertension: Secondary | ICD-10-CM

## 2016-10-19 DIAGNOSIS — C799 Secondary malignant neoplasm of unspecified site: Secondary | ICD-10-CM

## 2016-10-19 DIAGNOSIS — C7951 Secondary malignant neoplasm of bone: Secondary | ICD-10-CM

## 2016-10-19 DIAGNOSIS — I25810 Atherosclerosis of coronary artery bypass graft(s) without angina pectoris: Secondary | ICD-10-CM | POA: Diagnosis not present

## 2016-10-19 DIAGNOSIS — I619 Nontraumatic intracerebral hemorrhage, unspecified: Secondary | ICD-10-CM

## 2016-10-19 DIAGNOSIS — Z8673 Personal history of transient ischemic attack (TIA), and cerebral infarction without residual deficits: Secondary | ICD-10-CM | POA: Diagnosis not present

## 2016-10-19 DIAGNOSIS — T66XXXA Radiation sickness, unspecified, initial encounter: Secondary | ICD-10-CM

## 2016-10-19 DIAGNOSIS — F329 Major depressive disorder, single episode, unspecified: Secondary | ICD-10-CM | POA: Diagnosis not present

## 2016-10-19 DIAGNOSIS — C439 Malignant melanoma of skin, unspecified: Secondary | ICD-10-CM

## 2016-10-19 DIAGNOSIS — C770 Secondary and unspecified malignant neoplasm of lymph nodes of head, face and neck: Secondary | ICD-10-CM | POA: Diagnosis not present

## 2016-10-19 DIAGNOSIS — C7931 Secondary malignant neoplasm of brain: Secondary | ICD-10-CM

## 2016-10-19 DIAGNOSIS — Z79899 Other long term (current) drug therapy: Secondary | ICD-10-CM

## 2016-10-19 DIAGNOSIS — N183 Chronic kidney disease, stage 3 unspecified: Secondary | ICD-10-CM

## 2016-10-19 DIAGNOSIS — E291 Testicular hypofunction: Secondary | ICD-10-CM | POA: Diagnosis not present

## 2016-10-19 DIAGNOSIS — E039 Hypothyroidism, unspecified: Secondary | ICD-10-CM

## 2016-10-19 LAB — CBC WITH DIFFERENTIAL/PLATELET
BASO%: 0.6 % (ref 0.0–2.0)
Basophils Absolute: 0 10*3/uL (ref 0.0–0.1)
EOS ABS: 0.1 10*3/uL (ref 0.0–0.5)
EOS%: 2.2 % (ref 0.0–7.0)
HEMATOCRIT: 47.7 % (ref 38.4–49.9)
HGB: 16.4 g/dL (ref 13.0–17.1)
LYMPH#: 0.4 10*3/uL — AB (ref 0.9–3.3)
LYMPH%: 7.3 % — AB (ref 14.0–49.0)
MCH: 32.4 pg (ref 27.2–33.4)
MCHC: 34.5 g/dL (ref 32.0–36.0)
MCV: 94.1 fL (ref 79.3–98.0)
MONO#: 0.5 10*3/uL (ref 0.1–0.9)
MONO%: 10 % (ref 0.0–14.0)
NEUT%: 79.9 % — AB (ref 39.0–75.0)
NEUTROS ABS: 4.1 10*3/uL (ref 1.5–6.5)
PLATELETS: 161 10*3/uL (ref 140–400)
RBC: 5.07 10*6/uL (ref 4.20–5.82)
RDW: 15.8 % — ABNORMAL HIGH (ref 11.0–14.6)
WBC: 5.1 10*3/uL (ref 4.0–10.3)

## 2016-10-19 LAB — TSH: TSH: 5.431 m[IU]/L — AB (ref 0.320–4.118)

## 2016-10-19 LAB — COMPREHENSIVE METABOLIC PANEL
ALT: 18 U/L (ref 0–55)
ANION GAP: 12 meq/L — AB (ref 3–11)
AST: 17 U/L (ref 5–34)
Albumin: 3.1 g/dL — ABNORMAL LOW (ref 3.5–5.0)
Alkaline Phosphatase: 77 U/L (ref 40–150)
BILIRUBIN TOTAL: 0.57 mg/dL (ref 0.20–1.20)
BUN: 41.6 mg/dL — ABNORMAL HIGH (ref 7.0–26.0)
CALCIUM: 10 mg/dL (ref 8.4–10.4)
CO2: 27 mEq/L (ref 22–29)
CREATININE: 1.3 mg/dL (ref 0.7–1.3)
Chloride: 104 mEq/L (ref 98–109)
EGFR: 54 mL/min/{1.73_m2} — AB (ref >90)
Glucose: 146 mg/dl — ABNORMAL HIGH (ref 70–140)
Potassium: 4.4 mEq/L (ref 3.5–5.1)
Sodium: 143 mEq/L (ref 136–145)
TOTAL PROTEIN: 6.4 g/dL (ref 6.4–8.3)

## 2016-10-19 LAB — MAGNESIUM: MAGNESIUM: 2.3 mg/dL (ref 1.5–2.5)

## 2016-10-19 NOTE — Telephone Encounter (Signed)
Gave patient avs report and appointments for August.  °

## 2016-10-22 ENCOUNTER — Encounter: Payer: Self-pay | Admitting: Hematology

## 2016-10-26 ENCOUNTER — Telehealth: Payer: Self-pay | Admitting: Hematology

## 2016-10-26 DIAGNOSIS — I255 Ischemic cardiomyopathy: Secondary | ICD-10-CM | POA: Diagnosis not present

## 2016-10-26 DIAGNOSIS — C799 Secondary malignant neoplasm of unspecified site: Secondary | ICD-10-CM | POA: Diagnosis not present

## 2016-10-26 DIAGNOSIS — I2510 Atherosclerotic heart disease of native coronary artery without angina pectoris: Secondary | ICD-10-CM | POA: Diagnosis not present

## 2016-10-26 DIAGNOSIS — Z95810 Presence of automatic (implantable) cardiac defibrillator: Secondary | ICD-10-CM | POA: Diagnosis not present

## 2016-10-26 NOTE — Telephone Encounter (Signed)
sw pt to confirm r/s appt to 7/25 per sch msg

## 2016-10-30 ENCOUNTER — Telehealth: Payer: Self-pay | Admitting: Pharmacist

## 2016-10-30 DIAGNOSIS — Z79899 Other long term (current) drug therapy: Secondary | ICD-10-CM | POA: Diagnosis not present

## 2016-10-30 DIAGNOSIS — R531 Weakness: Secondary | ICD-10-CM | POA: Diagnosis not present

## 2016-10-30 DIAGNOSIS — F329 Major depressive disorder, single episode, unspecified: Secondary | ICD-10-CM | POA: Diagnosis not present

## 2016-10-30 DIAGNOSIS — D509 Iron deficiency anemia, unspecified: Secondary | ICD-10-CM | POA: Diagnosis not present

## 2016-10-30 DIAGNOSIS — I2510 Atherosclerotic heart disease of native coronary artery without angina pectoris: Secondary | ICD-10-CM | POA: Diagnosis not present

## 2016-10-30 DIAGNOSIS — E039 Hypothyroidism, unspecified: Secondary | ICD-10-CM | POA: Diagnosis not present

## 2016-10-30 DIAGNOSIS — E291 Testicular hypofunction: Secondary | ICD-10-CM | POA: Diagnosis not present

## 2016-10-30 DIAGNOSIS — T887XXS Unspecified adverse effect of drug or medicament, sequela: Secondary | ICD-10-CM | POA: Diagnosis not present

## 2016-10-30 NOTE — Telephone Encounter (Signed)
Oral Chemotherapy Pharmacist Encounter   Date7/23/18: Attempted to reach patient for follow up on oral medication: Tafinlar and Mekinist. MD had discontinued Mekinist usage short term due to fatigue. Calling to follow-up on energy level of patient and assess adherence/further toxicities. Patient was not at home. Left message for patient to call back with any questions or issues.   I also left VM for patient's daughter, [name], to call [facility] with questions or concerns.  Thank you,  Johny Drilling, PharmD, BCPS, [facility] [phone]

## 2016-11-01 ENCOUNTER — Encounter: Payer: Self-pay | Admitting: Hematology

## 2016-11-01 ENCOUNTER — Other Ambulatory Visit (HOSPITAL_BASED_OUTPATIENT_CLINIC_OR_DEPARTMENT_OTHER): Payer: Medicare Other

## 2016-11-01 ENCOUNTER — Inpatient Hospital Stay (HOSPITAL_COMMUNITY)
Admission: RE | Admit: 2016-11-01 | Discharge: 2016-11-04 | DRG: 641 | Disposition: A | Discharge status: Skilled Nursing Facility | Payer: Medicare Other | Source: Ambulatory Visit | Attending: Internal Medicine | Admitting: Internal Medicine

## 2016-11-01 ENCOUNTER — Encounter (HOSPITAL_COMMUNITY): Payer: Self-pay | Admitting: *Deleted

## 2016-11-01 ENCOUNTER — Ambulatory Visit (HOSPITAL_BASED_OUTPATIENT_CLINIC_OR_DEPARTMENT_OTHER): Payer: Medicare Other | Admitting: Hematology

## 2016-11-01 DIAGNOSIS — F329 Major depressive disorder, single episode, unspecified: Secondary | ICD-10-CM | POA: Diagnosis present

## 2016-11-01 DIAGNOSIS — N400 Enlarged prostate without lower urinary tract symptoms: Secondary | ICD-10-CM | POA: Diagnosis present

## 2016-11-01 DIAGNOSIS — Z8249 Family history of ischemic heart disease and other diseases of the circulatory system: Secondary | ICD-10-CM

## 2016-11-01 DIAGNOSIS — Z801 Family history of malignant neoplasm of trachea, bronchus and lung: Secondary | ICD-10-CM

## 2016-11-01 DIAGNOSIS — I69151 Hemiplegia and hemiparesis following nontraumatic intracerebral hemorrhage affecting right dominant side: Secondary | ICD-10-CM | POA: Diagnosis not present

## 2016-11-01 DIAGNOSIS — Z8261 Family history of arthritis: Secondary | ICD-10-CM

## 2016-11-01 DIAGNOSIS — Z923 Personal history of irradiation: Secondary | ICD-10-CM

## 2016-11-01 DIAGNOSIS — N183 Chronic kidney disease, stage 3 unspecified: Secondary | ICD-10-CM | POA: Diagnosis present

## 2016-11-01 DIAGNOSIS — Z9989 Dependence on other enabling machines and devices: Secondary | ICD-10-CM

## 2016-11-01 DIAGNOSIS — I25810 Atherosclerosis of coronary artery bypass graft(s) without angina pectoris: Secondary | ICD-10-CM | POA: Diagnosis not present

## 2016-11-01 DIAGNOSIS — R5383 Other fatigue: Secondary | ICD-10-CM | POA: Diagnosis not present

## 2016-11-01 DIAGNOSIS — I619 Nontraumatic intracerebral hemorrhage, unspecified: Secondary | ICD-10-CM | POA: Diagnosis present

## 2016-11-01 DIAGNOSIS — Z7952 Long term (current) use of systemic steroids: Secondary | ICD-10-CM

## 2016-11-01 DIAGNOSIS — I129 Hypertensive chronic kidney disease with stage 1 through stage 4 chronic kidney disease, or unspecified chronic kidney disease: Secondary | ICD-10-CM | POA: Diagnosis present

## 2016-11-01 DIAGNOSIS — I10 Essential (primary) hypertension: Secondary | ICD-10-CM

## 2016-11-01 DIAGNOSIS — G4733 Obstructive sleep apnea (adult) (pediatric): Secondary | ICD-10-CM | POA: Diagnosis present

## 2016-11-01 DIAGNOSIS — K219 Gastro-esophageal reflux disease without esophagitis: Secondary | ICD-10-CM | POA: Diagnosis present

## 2016-11-01 DIAGNOSIS — Z8673 Personal history of transient ischemic attack (TIA), and cerebral infarction without residual deficits: Secondary | ICD-10-CM

## 2016-11-01 DIAGNOSIS — C770 Secondary and unspecified malignant neoplasm of lymph nodes of head, face and neck: Secondary | ICD-10-CM | POA: Diagnosis not present

## 2016-11-01 DIAGNOSIS — C7951 Secondary malignant neoplasm of bone: Secondary | ICD-10-CM

## 2016-11-01 DIAGNOSIS — R2681 Unsteadiness on feet: Secondary | ICD-10-CM | POA: Diagnosis not present

## 2016-11-01 DIAGNOSIS — Z951 Presence of aortocoronary bypass graft: Secondary | ICD-10-CM

## 2016-11-01 DIAGNOSIS — E039 Hypothyroidism, unspecified: Secondary | ICD-10-CM | POA: Diagnosis present

## 2016-11-01 DIAGNOSIS — F068 Other specified mental disorders due to known physiological condition: Secondary | ICD-10-CM | POA: Diagnosis present

## 2016-11-01 DIAGNOSIS — E291 Testicular hypofunction: Secondary | ICD-10-CM

## 2016-11-01 DIAGNOSIS — T66XXXA Radiation sickness, unspecified, initial encounter: Secondary | ICD-10-CM | POA: Diagnosis not present

## 2016-11-01 DIAGNOSIS — T451X5A Adverse effect of antineoplastic and immunosuppressive drugs, initial encounter: Secondary | ICD-10-CM | POA: Diagnosis present

## 2016-11-01 DIAGNOSIS — C439 Malignant melanoma of skin, unspecified: Secondary | ICD-10-CM

## 2016-11-01 DIAGNOSIS — C7800 Secondary malignant neoplasm of unspecified lung: Secondary | ICD-10-CM | POA: Diagnosis not present

## 2016-11-01 DIAGNOSIS — Z79899 Other long term (current) drug therapy: Secondary | ICD-10-CM

## 2016-11-01 DIAGNOSIS — C779 Secondary and unspecified malignant neoplasm of lymph node, unspecified: Secondary | ICD-10-CM | POA: Diagnosis present

## 2016-11-01 DIAGNOSIS — I252 Old myocardial infarction: Secondary | ICD-10-CM

## 2016-11-01 DIAGNOSIS — N179 Acute kidney failure, unspecified: Secondary | ICD-10-CM | POA: Diagnosis not present

## 2016-11-01 DIAGNOSIS — I2510 Atherosclerotic heart disease of native coronary artery without angina pectoris: Secondary | ICD-10-CM | POA: Diagnosis present

## 2016-11-01 DIAGNOSIS — Z7982 Long term (current) use of aspirin: Secondary | ICD-10-CM | POA: Diagnosis not present

## 2016-11-01 DIAGNOSIS — C7931 Secondary malignant neoplasm of brain: Secondary | ICD-10-CM | POA: Diagnosis not present

## 2016-11-01 DIAGNOSIS — Z950 Presence of cardiac pacemaker: Secondary | ICD-10-CM

## 2016-11-01 DIAGNOSIS — Z87891 Personal history of nicotine dependence: Secondary | ICD-10-CM

## 2016-11-01 DIAGNOSIS — C799 Secondary malignant neoplasm of unspecified site: Secondary | ICD-10-CM

## 2016-11-01 DIAGNOSIS — I69111 Memory deficit following nontraumatic intracerebral hemorrhage: Secondary | ICD-10-CM | POA: Diagnosis not present

## 2016-11-01 DIAGNOSIS — N189 Chronic kidney disease, unspecified: Secondary | ICD-10-CM | POA: Diagnosis not present

## 2016-11-01 LAB — CBC WITH DIFFERENTIAL/PLATELET
BASO%: 0.3 % (ref 0.0–2.0)
BASOS ABS: 0 10*3/uL (ref 0.0–0.1)
EOS ABS: 0.2 10*3/uL (ref 0.0–0.5)
EOS%: 3.3 % (ref 0.0–7.0)
HEMATOCRIT: 45.9 % (ref 38.4–49.9)
HEMOGLOBIN: 15.6 g/dL (ref 13.0–17.1)
LYMPH%: 16.6 % (ref 14.0–49.0)
MCH: 32.4 pg (ref 27.2–33.4)
MCHC: 34 g/dL (ref 32.0–36.0)
MCV: 95.2 fL (ref 79.3–98.0)
MONO#: 0.3 10*3/uL (ref 0.1–0.9)
MONO%: 3.7 % (ref 0.0–14.0)
NEUT#: 5.3 10*3/uL (ref 1.5–6.5)
NEUT%: 76.1 % — ABNORMAL HIGH (ref 39.0–75.0)
PLATELETS: 186 10*3/uL (ref 140–400)
RBC: 4.82 10*6/uL (ref 4.20–5.82)
RDW: 14.9 % — AB (ref 11.0–14.6)
WBC: 7 10*3/uL (ref 4.0–10.3)
lymph#: 1.2 10*3/uL (ref 0.9–3.3)

## 2016-11-01 LAB — COMPREHENSIVE METABOLIC PANEL
ALBUMIN: 3.6 g/dL (ref 3.5–5.0)
ALK PHOS: 62 U/L (ref 40–150)
ALT: 15 U/L (ref 0–55)
ANION GAP: 11 meq/L (ref 3–11)
AST: 17 U/L (ref 5–34)
BUN: 54.1 mg/dL — AB (ref 7.0–26.0)
CALCIUM: 13.4 mg/dL — AB (ref 8.4–10.4)
CO2: 31 mEq/L — ABNORMAL HIGH (ref 22–29)
Chloride: 102 mEq/L (ref 98–109)
Creatinine: 2 mg/dL — ABNORMAL HIGH (ref 0.7–1.3)
EGFR: 31 mL/min/{1.73_m2} — AB (ref >90)
Glucose: 90 mg/dl (ref 70–140)
POTASSIUM: 4.5 meq/L (ref 3.5–5.1)
Sodium: 143 mEq/L (ref 136–145)
Total Bilirubin: 0.77 mg/dL (ref 0.20–1.20)
Total Protein: 7.1 g/dL (ref 6.4–8.3)

## 2016-11-01 LAB — MAGNESIUM: MAGNESIUM: 2.2 mg/dL (ref 1.5–2.5)

## 2016-11-01 MED ORDER — SODIUM CHLORIDE 0.9 % IV SOLN
INTRAVENOUS | Status: DC
  Administered 2016-11-01: 21:00:00 via INTRAVENOUS

## 2016-11-01 MED ORDER — SODIUM CHLORIDE 0.9 % IV SOLN
60.0000 mg | Freq: Once | INTRAVENOUS | Status: AC
  Administered 2016-11-01: 60 mg via INTRAVENOUS
  Filled 2016-11-01: qty 10

## 2016-11-01 MED ORDER — CARVEDILOL 6.25 MG PO TABS
6.2500 mg | ORAL_TABLET | Freq: Two times a day (BID) | ORAL | Status: DC
  Administered 2016-11-02 – 2016-11-04 (×5): 6.25 mg via ORAL
  Filled 2016-11-01 (×5): qty 1

## 2016-11-01 MED ORDER — SERTRALINE HCL 100 MG PO TABS
100.0000 mg | ORAL_TABLET | Freq: Every day | ORAL | Status: DC
  Administered 2016-11-02 – 2016-11-04 (×3): 100 mg via ORAL
  Filled 2016-11-01 (×4): qty 1

## 2016-11-01 MED ORDER — ENSURE ENLIVE PO LIQD
237.0000 mL | Freq: Two times a day (BID) | ORAL | Status: DC
  Administered 2016-11-02 – 2016-11-04 (×5): 237 mL via ORAL

## 2016-11-01 MED ORDER — DEXAMETHASONE 4 MG PO TABS
4.0000 mg | ORAL_TABLET | Freq: Every day | ORAL | Status: DC
  Administered 2016-11-02 – 2016-11-04 (×3): 4 mg via ORAL
  Filled 2016-11-01 (×4): qty 1

## 2016-11-01 MED ORDER — PENTOXIFYLLINE ER 400 MG PO TBCR
400.0000 mg | EXTENDED_RELEASE_TABLET | Freq: Two times a day (BID) | ORAL | Status: DC
Start: 2016-11-01 — End: 2016-11-04
  Administered 2016-11-01 – 2016-11-04 (×6): 400 mg via ORAL
  Filled 2016-11-01 (×7): qty 1

## 2016-11-01 MED ORDER — ROSUVASTATIN CALCIUM 10 MG PO TABS
10.0000 mg | ORAL_TABLET | Freq: Every day | ORAL | Status: DC
  Administered 2016-11-01 – 2016-11-03 (×3): 10 mg via ORAL
  Filled 2016-11-01 (×3): qty 1

## 2016-11-01 MED ORDER — ASPIRIN EC 81 MG PO TBEC
81.0000 mg | DELAYED_RELEASE_TABLET | Freq: Every day | ORAL | Status: DC
  Administered 2016-11-02 – 2016-11-04 (×3): 81 mg via ORAL
  Filled 2016-11-01 (×4): qty 1

## 2016-11-01 MED ORDER — ENOXAPARIN SODIUM 30 MG/0.3ML ~~LOC~~ SOLN: 30.0000 mg | SUBCUTANEOUS | Status: DC

## 2016-11-01 MED ORDER — LEVOTHYROXINE SODIUM 50 MCG PO TABS
175.0000 ug | ORAL_TABLET | Freq: Every day | ORAL | Status: DC
  Administered 2016-11-02 – 2016-11-04 (×3): 175 ug via ORAL
  Filled 2016-11-01 (×3): qty 1

## 2016-11-01 NOTE — Progress Notes (Signed)
David [name]  [phone]) [phone] [phone]) [phone]  Clinic Follow up Note   Patient Care Team: [name], MD as PCP - General (Internal Medicine) 11/01/2016  Chief complaint: Follow-up metastatic melanoma  Oncology History   Metastatic melanoma [facility])   Staging form: Melanoma of the Skin, AJCC 7th Edition     Clinical stage from 09/21/2015: Stage IV [place], [place], [place]) - Signed by [name], MD on 10/09/2015       Metastatic melanoma ([place])   09/16/2015 Imaging    PET scan showed a hypermetabolic 5.5DD lingular nodule, and a 15m right upper lobe lung nodule, left subclavicular nodes, and bone metastasis in the proximal right humerus and right femur.       09/21/2015 Initial Diagnosis    Metastatic melanoma ([place]      09/21/2015 Initial Biopsy    Left subclavicular lymph node biopsy showed prostatic of melanoma      09/21/2015 Miscellaneous    BRAF V600K mutation (+)       10/04/2015 Imaging    CT head with without contrast showed a 2.0 x 1.6 x 1.7 cm superficial left frontal hyperdense lesion, with postcontrast enhancement, lying within the previous hemorrhagic area, concerning for solitary melanoma metastasis      10/06/2015 - 09/06/2016 Chemotherapy    Nivolumab 2492mevery 2 weeks      10/13/2015 - 10/13/2015 Radiation Therapy    10/13/2015 Preop SRS treatment: 14 Gy  to the left frontal lesion in 1 fraction      10/15/2015 Surgery    left front craniotomy and tumor excision       10/15/2015 Pathology Results    left frontal brain mass excision showed metastatic melanoma, 1.5X1.5X0.6cm      01/13/2016 Imaging    CT head without and with contrast showed ill-defined enhancement and dural thickening of the left frontal resection cavity may represent a post treatment changes or residual neoplasm. A new 6 mm hypodensity in the right cerebellar hemisphere with uncertainty enhancement may represent a metastasis or small brain parenchymal hemorrhage.       01/27/2016 - 01/27/2016 Radiation Therapy    01/27/16 SRS Treatment:  20 Gy in 1 fraction to a 6 mm right cerebellar brain met       03/29/2016 PET scan    PET 03/29/2016 IMPRESSION: 1. Overall there has been an interval response to therapy. The index lesions sided on previous exam it are less hypermetabolic when compared with the previous exam. 2. There is an intramuscular lesion within the right masseter which has increased FDG uptake compared with previous exam. 3. No new sites of disease identified. 4. Aortic atherosclerosis and infrarenal abdominal aortic aneurysm. Recommend followup by ultrasound in 3 years. This recommendation follows ACR consensus guidelines: White Paper of the ACR Incidental Findings Committee II on Vascular Findings. J Joellyn Ruedadiol 2013; 10:789-794      04/20/2016 Imaging    CT Head w wo contrast 1. Mild patchy enhancement at the left anterior frontal lobe treatment site (series 11, image 39) without mass effect and stable surrounding white matter hypodensity without strong evidence of acute vasogenic edema. Recommend continued CT surveillance. 2. No new metastatic disease or new intracranial abnormality identified. 3. Probable benign 14 mm left parietal bone lucent lesion, unchanged since May 2017.      05/06/2016 - 05/08/2016 Hospital Admission    Pt was admitted for rectal bleeding for one day. Breathing spontaneously resolved, hemoglobin dropped from 15 to 10.5, did not  require blood transfusion. Colonoscopy showed no active bleeding, except diverticulosis and hemorrhoid. He was discharged to home.      05/08/2016 Procedure    Colonoscopy  - Stool in the ascending colon. Fluid aspiration performed. - Diverticulosis in the entire examined colon. - Non-bleeding internal hemorrhoids. - The examination was otherwise normal on direct and retroflexion views.      05/29/2016 Imaging    PET Image Restaging IMPRESSION: 1. Stable exam with no evidence  progression. 2. Photopenia in the LEFT frontal lobe at prior surgical site. 3. Stable mildly hypermetabolic RIGHT paratracheal node. This may represent a thyroid nodule. 4. Stable size of minimally metabolic lingular pulmonary nodule. 5. Stable skeletal lesion of the RIGHT iliac crest 6. No cutaneous metabolic lesion.      07/20/2016 Imaging    CT Head W WO Contrast IMPRESSION: Pronounced change in the left frontal region. Much more regional vasogenic edema. Relatively stable appearance of the abnormal enhancement extending from the lateral margin of the frontal horn of left lateral ventricle to the frontal brain surface. New region of abnormal enhancement surrounding the frontal horn of the left lateral ventricle measuring approximately 3.2 cm. The differential diagnosis is recurrent tumor versus radiation necrosis. The patient is reportedly not an MRI candidate. This area is large enough that CT perfusion could possibly make a reasonable differentiation.      09/02/2016 Imaging    CT Head 09/02/16 IMPRESSION: Somewhat mixed pattern of slight posterior progression of pleomorphic enhancement surrounding the LEFT lateral ventricle although no significant mass effect, and reduced vasogenic edema compared with the scan in April.       Chemotherapy    Start dabrafenib and trametinib 09/27/16       10/05/2016 Imaging    CT Head w & w/o contrast  IMPRESSION: 1. Decreased size of the enhancing area within the left frontal lobe treatment site. Surrounding edema has also decreased. The findings support continued evolution of radiation necrosis. 2. No new enhancing lesions.      10/18/2016 PET scan    IMPRESSION: 1. New focal hypermetabolism in the proximal sigmoid colon with associated focal colonic wall thickening and mild pericolonic fat stranding at the site of a proximal sigmoid diverticulum, favored to represent mild acute sigmoid diverticulitis. Metastatic disease  is considered less likely. Clinical correlation is necessary. Consider appropriate antibiotic therapy, as clinically warranted. Attention to this region recommended on future PET-CT follow-up. 2. Otherwise complete metabolic response with no evidence of hypermetabolic metastatic disease. Lingular pulmonary nodule is slightly decreased in size and demonstrates no significant metabolism. 3. No residual hypermetabolism in the right thyroid lobe nodule, which is stable in size. 4. Aortic Atherosclerosis (ICD10-I70.0). Stable 3.1 cm infrarenal abdominal aortic aneurysm. Aortic aneurysm NOS (ICD10-I71.9). Recommend followup by ultrasound in 3 years. This recommendation follows ACR consensus guidelines: White Paper of the ACR Incidental Findings Committee II on Vascular Findings. J Am Coll Radiol 2013; 10:789-794.       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 He was found to have left frontal hemorrhage 5.7 cm x 4.5 cm with 6 mm left to right shift.During the workup for his stroke, a left upper lung mass was noticed on the CT scan. I was called to evaluate his lung mass.  Patient has aphasia, not able to communicate much. According to his daughter, Dr. [name], who is a OB GYN at [employer]  [facility], he probably only smoked for very short period time during the college. He did have 3 skin melanoma, which was removed, last one was 2-3 years ago. No other cancer history.  CURRENT THERAPY: Nivolumab 240 mg, every 2 weeks, started on 10/06/2015 changed to dabrafenib and trametinib 09/27/16 - stopping 11/01/16  INTERVAL HISTORY:  David [name] returns for follow-up. He presents with his wife and he is in a wheelchair. He reports he is has no energy to walk. He stumbbles. He has not fallen.   He was taking dabrafenib 1 tab (84m) 2 times a day, dose reduced from last week as I  recommended due to his fatigue. However his fatigue has not improved.  He is taking dexamethasone but not sure how he is taking it, probably 243mdaily.    He also has memory loss, difficulty on some cognitive functions. This is overall stable. No fever or chills.   REVIEW OF SYSTEMS:  Constitutional: Denies fevers, chills or abnormal weight loss, (+) fatigue (+) confusion (+) head pain  Eyes: Denies blurriness of vision Ears, nose, mouth, throat, and face: Denies mucositis or sore throat Respiratory: Denies cough, dyspnea or wheezes Cardiovascular: Denies palpitation, chest discomfort or lower extremity swelling Gastrointestinal:  Denies nausea, heartburn, hematochezia (+) mild constipation from iron pill. Skin: (+) mild rash on bilateral upper extremities which is mildly tender Lymphatics: Denies new lymphadenopathy or easy bruising Neurological:see HPI  Behavioral/Psych: Mood is stable, no new changes  All other systems were reviewed with the patient and are negative.  MEDICAL HISTORY:  Past Medical History:  Diagnosis Date  . Anginal pain ([facility]  . Arthritis    mostly hands  . Benign familial hematuria   . BPH (benign prostatic hypertrophy)   . Brain cancer ([facility]   melanoma with brain met  . Coronary artery disease   . GERD (gastroesophageal reflux disease)   . High cholesterol   . Hypertension   . Hypothyroidism   . Intracerebral hemorrhage ([place]  . Melanoma ([facility]  . Metastatic melanoma to lung ([place]  . Mood disorder ([place]  . Myocardial infarction ([facility]  . Pacemaker    due to syncope, [facility] (Dr. BoUvaldo Rising . Rectal bleed    due to NSAIDS  . Renal disorder   . Skin cancer    melanoma  . Sleep apnea    uses CPAP  . [facility] ([facility]    SURGICAL HISTORY: Past Surgical History:  Procedure Laterality Date  . APPLICATION OF CRANIAL NAVIGATION N/A 10/15/2015   Procedure: APPLICATION OF CRANIAL NAVIGATION;  Surgeon: JoErline LevineMD;  [facility];  Service: Neurosurgery;  Laterality: N/A;  . CARDIAC CATHETERIZATION  2013  . CARDIAC SURGERY     bypass X 2  . COLONOSCOPY WITH PROPOFOL N/A 05/08/2016   Procedure: COLONOSCOPY WITH PROPOFOL;  Surgeon: KiJonathon BellowsMD;  [facility];  Service: Gastroenterology;  Laterality: N/A;  . CORONARY ARTERY BYPASS GRAFT  2013   LIMA-LAD, SVG-PDA 03/27/11 (Dr. KrFrancee Gentile . CRANIOTOMY N/A 10/15/2015   Procedure: LEFT FRONTAL CRANIOTOMY TUMOR EXCISION with Curve;  Surgeon: JoErline LevineMD;  [facility];  Service: Neurosurgery;  Laterality: N/A;  CRANIOTOMY TUMOR EXCISION  . EYE SURGERY    . FOOT SURGERY  08/2010  . JOINT REPLACEMENT Left    partial knee  . KNEE ARTHROSCOPY  12/2008  . LYMPH NODE BIOPSY    .  PACEMAKER INSERTION    . TONSILLECTOMY      I have reviewed the social history and family history with the patient and they are unchanged from previous note.  ALLERGIES:  is allergic to zocor [simvastatin].  MEDICATIONS:  Current Outpatient Prescriptions  Medication Sig Dispense Refill  . acetaminophen (TYLENOL) 325 MG tablet Take 2 tablets (650 mg total) by mouth every 6 (six) hours as needed for mild pain. (Patient taking differently: Take 650 mg by mouth 2 (two) times daily. )    . ASPIRIN LOW DOSE 81 MG EC tablet Take 81 mg by mouth daily  11  . carvedilol (COREG) 6.25 MG tablet Take 6.25 mg by mouth 2 (two) times daily with a meal.    . dabrafenib mesylate (TAFINLAR) 75 MG capsule Take 2 capsules (150 mg total) by mouth 2 (two) times daily. Take on an empty stomach 1 hour before or 2 hours after meals. (Patient taking differently: Take 75 mg by mouth 2 (two) times daily. Take on an empty stomach 1 hour before or 2 hours after meals.) 120 capsule 1  . dexamethasone (DECADRON) 4 MG tablet Take 1 tablet (4 mg total) by mouth daily. 30 tablet 2  . levothyroxine (SYNTHROID, LEVOTHROID) 175 MCG tablet Take 175 mcg by mouth daily before  breakfast.    . pentoxifylline (TRENTAL) 400 MG CR tablet Take 1 tablet (400 mg total) by mouth 2 (two) times daily. 60 tablet 6  . PRESCRIPTION MEDICATION Inhale into the lungs at bedtime. CPAP    . rosuvastatin (CRESTOR) 10 MG tablet Take 10 mg by mouth at bedtime.    . sertraline (ZOLOFT) 50 MG tablet Take 100 mg by mouth daily.     Marland Kitchen testosterone cypionate (DEPOTESTOTERONE CYPIONATE) 100 MG/ML injection Inject 100 mg into the muscle every 14 (fourteen) days.     . vitamin E (VITAMIN E) 400 UNIT capsule Take 1 capsule (400 Units total) by mouth 2 (two) times daily. 60 capsule 6  . amLODipine (NORVASC) 2.5 MG tablet Take 2.5 mg by mouth daily.     No current facility-administered medications for this visit.    Facility-Administered Medications Ordered in Other Visits  Medication Dose Route Frequency Provider Last Rate Last Dose  . 0.9 %  sodium chloride infusion  1,000 mL Intravenous Once [name], MD        PHYSICAL EXAMINATION: ECOG PERFORMANCE STATUS: 1 BP: 142/88 P: 82 R: 18 W: 200 GENERAL:alert, no distress and comfortable SKIN: skin color, texture, turgor are normal, (+) diffuse papular skin rash on bilateral forearms and hand, improved EYES: normal, Conjunctiva are pink and non-injected, sclera clear OROPHARYNX:no exudate, no erythema and lips, buccal mucosa, and tongue normal  NECK: supple, thyroid normal size, non-tender, without nodularity LYMPH:  no palpable lymphadenopathy in the cervical, [place], axillary or inguinal LUNGS: clear to auscultation and percussion with normal breathing effort HEART: regular rate & rhythm and no murmurs and no lower extremity edema ABDOMEN:abdomen soft, non-tender and normal bowel sounds Musculoskeletal:no cyanosis of digits and no clubbing  NEURO: alert & oriented x 3 with fluent speech, no focal motor/sensory deficits, he walks independently, gait is normal   LABORATORY DATA:  I have reviewed the data as listed CBC Latest Ref Rng & Units  11/01/2016 10/19/2016 09/20/2016  WBC 4.0 - 10.3 10e3/uL 7.0 5.1 8.6  Hemoglobin 13.0 - 17.1 g/dL 15.6 16.4 17.1  Hematocrit 38.4 - 49.9 % 45.9 47.7 49.4  Platelets 140 - 400 10e3/uL 186 161 92(L)  CMP Latest Ref Rng & Units 11/01/2016 10/19/2016 09/20/2016  Glucose 70 - 140 mg/dl 90 146(H) 85  BUN 7.0 - 26.0 mg/dL 54.1(H) 41.6(H) 37.0(H)  Creatinine 0.7 - 1.3 mg/dL 2.0(H) 1.3 1.1  Sodium 136 - 145 mEq/L 143 143 136  Potassium 3.5 - 5.1 mEq/L 4.5 4.4 5.0  Chloride 101 - 111 mmol/L - - -  CO2 22 - 29 mEq/L 31(H) 27 25  Calcium 8.4 - 10.4 mg/dL 13.4(HH) 10.0 9.4  Total Protein 6.4 - 8.3 g/dL 7.1 6.4 6.2(L)  Total Bilirubin 0.20 - 1.20 mg/dL 0.77 0.57 0.66  Alkaline Phos 40 - 150 U/L 62 77 56  AST 5 - 34 U/L 17 17 24   ALT 0 - 55 U/L 15 18 47   PATHOLOGY REPORT: Diagnosis 09/21/2015 Lymph node, needle/core biopsy, hypermetabolic left sub clavicular LN - METASTATIC MALIGNANT MELANOMA. - SEE COMMENT.  Diagnosis 10/15/2015 Brain, for tumor resection, Left frontal - METASTATIC MELANOMA.     PROCEDURES: Colonoscopy 05/08/16 - Stool in the ascending colon. Fluid aspiration performed. - Diverticulosis in the entire examined colon. - Non-bleeding internal hemorrhoids. - The examination was otherwise normal on direct and retroflexion views.  RADIOGRAPHIC STUDIES: I have personally reviewed the radiological images as listed and agreed with the findings in the report. No results found.   PET 10/18/16 IMPRESSION: 1. New focal hypermetabolism in the proximal sigmoid colon with associated focal colonic wall thickening and mild pericolonic fat stranding at the site of a proximal sigmoid diverticulum, favored to represent mild acute sigmoid diverticulitis. Metastatic disease is considered less likely. Clinical correlation is necessary. Consider appropriate antibiotic therapy, as clinically warranted. Attention to this region recommended on future PET-CT follow-up. 2. Otherwise complete  metabolic response with no evidence of hypermetabolic metastatic disease. Lingular pulmonary nodule is slightly decreased in size and demonstrates no significant metabolism. 3. No residual hypermetabolism in the right thyroid lobe nodule, which is stable in size. 4. Aortic Atherosclerosis (ICD10-I70.0). Stable 3.1 cm infrarenal abdominal aortic aneurysm. Aortic aneurysm NOS (ICD10-I71.9). Recommend followup by ultrasound in 3 years. This recommendation follows ACR consensus guidelines: White Paper of the ACR Incidental Findings Committee II on Vascular Findings. J Am Coll Radiol 2013;   CT Head W and WO Contrast 10/05/16 IMPRESSION: 1. Decreased size of the enhancing area within the left frontal lobe treatment site. Surrounding edema has also decreased. The findings support continued evolution of radiation necrosis. 2. No new enhancing lesions.  CT Head 09/02/16 IMPRESSION: Somewhat mixed pattern of slight posterior progression of pleomorphic enhancement surrounding the LEFT lateral ventricle although no significant mass effect, and reduced vasogenic edema compared with the scan in April.  CT Head W WO Contrast 2016/07/30 IMPRESSION: Pronounced change in the left frontal region. Much more regional vasogenic edema. Relatively stable appearance of the abnormal enhancement extending from the lateral margin of the frontal horn of left lateral ventricle to the frontal brain surface. New region of abnormal enhancement surrounding the frontal horn of the left lateral ventricle measuring approximately 3.2 cm. The differential diagnosis is recurrent tumor versus radiation necrosis. The patient is reportedly not an MRI candidate. This area is large enough that CT perfusion could possibly make a reasonable differentiation.  PET Image Restage 05/29/16 IMPRESSION: 1. Stable exam with no evidence progression. 2. Photopenia in the LEFT frontal lobe at prior surgical site. 3. Stable mildly  hypermetabolic RIGHT paratracheal node. This may represent a thyroid nodule. 4. Stable size of minimally metabolic lingular pulmonary nodule. 5. Stable skeletal lesion of the  RIGHT iliac crest 6. No cutaneous metabolic lesion.   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   1. Hypercalcemia and AKI  -New, calcium 13.4 today, it was normal 2 weeks ago. His creatinine also increased to 2.0 today. -He is not taking calcium supplement, is on multivitamin. -Possibly related to his underlying malignancy (bone mets), I don't think is related to Dabrafenib, but will stop it anyway due to his severe fatigue  -He was on Xgeva monthly, last dose on 08/10/2016, missed dose last month, which probably contributes to his hypercalcemia too  -Will admit pt to [facility] and give IV fluids and pamidronate.    2. Metastatic melanoma to lung, node, bone and brain, BRAF V600K mutation (+) -I previously  reviewed his PET/CT scan images with pt and his family members. The scan revealed hypermetabolic left upper lobe lung mass, 2 small right lung nodules, probable bone metastasis, and a subcutaneous left supraclavicular lesion, which is not palpable  -His repeat CT had showed a 2.0 cm left frontal hyperdense lesion with enhancement on contrast, in the area of his recent hemorrhagic stroke, highly suspicious for brain metastasis.  -We previously discussed his left supraclavicular node biopsy results, which showed metastatic melanoma.. -His case was discussed in our sarcoma tumor board. -I previously recommend a first-line treatment with Nivolumab alone, given his recent stroke, low tumor burden. I'll reserve BRAF inhibitor with or without MEK inhibitor as a second line therapy if he progress on immunotherapy. Other options of immunotherapy, especially checkpoint inhibitor with ipilumimab,  were also discussed with pt and his daughter -He has been tolerating Nivolumab well, no significant side effects except fatigue We'll continue -He is now status post SRS and surgical resection to his solitary brain metastasis on 10/15/15. -I previously reviewed his recent PET scan findings from 05/29/16, which showed stable disease in lung, node and bone. His previous hypermetabolic uptake in the right master muscle has resolved, no other new lesions  -He was tolerating Nivolumab without any significant side effects. -We'll continue Xgeva monthly for his bone metastasis. 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  -He has started dabrafenib and trametinib but tolerated poorly, will stop for now  -Labs reviewed and he has high calcium. Will [facility] and f/u with him there.   2. Hemorrhagic stroke and brain metastasis  -I encouraged him to continue physical therapy and occupational therapy. He has recovered very well -History post SRS and surgical resection of his solitary brain metastasis  -His neurology deficit has much improved -He recently received a course of dexamethasone for radionecrosis -He went to ED for slurred speech and vomiting and a Head CT was taken 09/02/16 -Per rad/onc, we suggest he taking dexamethasone 63m BID for a month and then 271mdaily for a month then stop.  -His last CT was 10/05/16   3. Fatigue, hypothyroidism and hypotestosteronemia -He has baseline hypothyroidism and hypotestosteronemia -His fatigue has much improved since he restarted testosterone injection by his primary care physician -He will follow-up with his primary care physician Dr. RuAsher Muiror his Synthroid dose adjustment  -His TSH was slightly In November 2017. TSH 9.6 on 06/15/16. - TSH 25.01 on 08/10/16. -The patient will continue with light exercise as he is able. -I again encouraged  him  to be active  4. Anemia and history of GI bleeding  -his anemia has resolved now -I previously encouraged him to continue oral iron supplement for a total of 3 months   5. HTN -he is on amlodipine and carvedilol, follow-up of his primary care physician -Due to drug interaction with new oral chemo he iwll ned to monitor his BP more often.   6. Depression  -he is on zoloft 66m daily, management by his PCP  -improved overall   7. CKD stage III -His Cr has been slightly elevated -I previously encouraged him to drink water adequately -avoid NSAIDs  8. Goal of care discussion  -We previously discussed the incurable nature of his metastatic melanoma, and the overall poor prognosis, especially if he does not have good response to chemotherapy or progress on chemo -The patient understands the goal of care is palliative. -I recommend DNR/DNI, he will think about it   9. CAD -2 double vessel bypass -PPsychologist, forensic-per pt (her daughter) request, will switch his cardiologist to Dr. BHaroldine Laws  [facility] to [facility]F and pamidronate -I contact his sister LWindy Carina she will come to pick up pt's wife (she has dementia). I spoke with pt's daughter Dr. LGala Romney(she is out of town now) and updated her  -F/u in hospital   All questions were answered. The patient knows to call the clinic with any problems, questions or concerns. No barriers to learning was detected.  I spent 30 minutes counseling the patient face to face. The total time spent in the appointment was 40 minutes and more than 50% was on counseling and review of test results.  This document serves as a record of services personally performed by YTruitt Merle MD. It was created on her behalf by AJoslyn Devon a trained medical scribe. The creation of this record is based on the scribe's personal observations and the provider's statements to them. This document has been checked and approved by the  attending provider.     FTruitt Merle MD 11/01/2016 6pm

## 2016-11-01 NOTE — H&P (Signed)
History and Physical  David [name] WYO:[id number] DOB: 03-29-1941 DOA: 11/01/2016  PCP:  [name], MD   Chief Complaint:  Sent for hypercalcemia  History of Present Illness:  Pt is a 76 yo male with hx of metastatic melanoma s/p surgical solitary brain lesion resection and chemotherapy with mets to bone and lung, HTN, hypothyroidism, who was sent from oncologist office today due to hypercalcemia. Pt said he has been feeling fatigue and tired recently but denied any abnormal sensory symptoms including numbness, tingling or spasms/tremors. He denied any other symptoms including pain, constipation ( last BM today), fever, chills, dyspnea. He has been taken Vit and Calcium tablets for months as a supplement.   Review of Systems:  CONSTITUTIONAL:     No night sweats.  +fatigue.  No fever. No chills. Eyes:                            No visual changes.  No eye pain.  No eye discharge.   ENT:                              No epistaxis.  No sinus pain.  No sore throat.   No congestion. RESPIRATORY:           No cough.  No wheeze.  No hemoptysis.  No dyspnea CARDIOVASCULAR   :  No chest pains.  No palpitations. GASTROINTESTINAL:  No abdominal pain.  No nausea. No vomiting.  No diarrhea. No     constipation.  No hematemesis.  No hematochezia.  No melena. GENITOURINARY:      No urgency.  No frequency.  No dysuria.  No hematuria.  N   obstructive symptoms.  No discharge.  No pain.   MUSCULOSKELETAL:  No musculoskeletal pain.  No joint swelling.  No arthritis. NEUROLOGICAL:        No confusion.  No weakness. No headache. No seizure. PSYCHIATRIC:             No depression. No anxiety. No suicidal ideation. SKIN:                             No rashes.  No lesions.  No wounds. ENDOCRINE:                No weight loss.  No polydipsia.  No polyuria.  No polyphagia. HEMATOLOGIC:           No purpura.  No petechiae.  No bleeding.  ALLERGIC                 : No pruritus.  No  angioedema Other:  Past Medical and Surgical History:   Past Medical History:  Diagnosis Date  . Anginal pain ([place])   . Arthritis    mostly hands  . Benign familial hematuria   . BPH (benign prostatic hypertrophy)   . Brain cancer ([place])    melanoma with brain met  . Coronary artery disease   . GERD (gastroesophageal reflux disease)   . High cholesterol   . Hypertension   . Hypothyroidism   . Intracerebral hemorrhage ([place])   . Melanoma ([place])   . Metastatic melanoma to lung ([place])   . Mood disorder ([place])   . Myocardial infarction ([place])   . Pacemaker    due to syncope, 3rd degree HB (upgrade to [place]  CRT-P 02/25/13 (Dr. Uvaldo Rising)  . Rectal bleed    due to NSAIDS  . Renal disorder   . Skin cancer    melanoma  . Sleep apnea    uses CPAP  . Stroke [facility])    Past Surgical History:  Procedure Laterality Date  . APPLICATION OF CRANIAL NAVIGATION N/A 10/15/2015   Procedure: APPLICATION OF CRANIAL NAVIGATION;  Surgeon: Erline Levine, MD;  Location: [facility] NEURO ORS;  Service: Neurosurgery;  Laterality: N/A;  . CARDIAC CATHETERIZATION  2013  . CARDIAC SURGERY     bypass X 2  . COLONOSCOPY WITH PROPOFOL N/A 05/08/2016   Procedure: COLONOSCOPY WITH PROPOFOL;  Surgeon: Jonathon Bellows, MD;  Location: [facility] END[facility]COPY;  Service: Gastroenterology;  Laterality: N/A;  . CORONARY ARTERY BYPASS GRAFT  2013   LIMA-LAD, SVG-PDA 03/27/11 (Dr. Francee Gentile)  . CRANIOTOMY N/A 10/15/2015   Procedure: LEFT FRONTAL CRANIOTOMY TUMOR EXCISION with Curve;  Surgeon: Erline Levine, MD;  Location: [facility] NEURO ORS;  Service: Neurosurgery;  Laterality: N/A;  CRANIOTOMY TUMOR EXCISION  . EYE SURGERY    . FOOT SURGERY  08/2010  . JOINT REPLACEMENT Left    partial knee  . KNEE ARTHROSCOPY  12/2008  . LYMPH NODE BIOPSY    . PACEMAKER INSERTION    . TONSILLECTOMY      Social History:   reports that he quit smoking about 97 years ago. He has a 3.00 pack-year smoking history. He has never used smokeless tobacco. He reports that  he drinks about 24.0 oz of alcohol per week . He reports that he does not use drugs.    Allergies  Allergen Reactions  . Zocor [Simvastatin] Other (See Comments)    Reaction:  Gave pt a fever     Family History  Problem Relation Age of Onset  . Cancer Mother 77       lung   . Hypertension Mother   . Hypertension Father   . Heart attack Father   . Heart disease Father   . Rheum arthritis Sister       Prior to Admission medications   Medication Sig Start Date End Date Taking? Authorizing Provider  aspirin EC 81 MG tablet Take 81 mg by mouth daily.   Yes [provider]  carvedilol (COREG) 6.25 MG tablet Take 6.25 mg by mouth 2 (two) times daily with a meal.   Yes [provider]  dexamethasone (DECADRON) 4 MG tablet Take 1 tablet (4 mg total) by mouth daily. 10/16/16  Yes Tyler Pita, MD  levothyroxine (SYNTHROID, LEVOTHROID) 175 MCG tablet Take 175 mcg by mouth daily before breakfast.   Yes [provider]  pentoxifylline (TRENTAL) 400 MG CR tablet Take 1 tablet (400 mg total) by mouth 2 (two) times daily. 07/24/16  Yes Hayden Pedro, PA-C  rosuvastatin (CRESTOR) 10 MG tablet Take 10 mg by mouth at bedtime.   Yes [provider]  sertraline (ZOLOFT) 50 MG tablet Take 100 mg by mouth daily.    Yes [provider]  testosterone cypionate (DEPOTESTOTERONE CYPIONATE) 100 MG/ML injection Inject 100 mg into the muscle every 14 (fourteen) days.    Yes [provider]  vitamin E (VITAMIN E) 400 UNIT capsule Take 1 capsule (400 Units total) by mouth 2 (two) times daily. 07/24/16  Yes Hayden Pedro, PA-C    Physical Exam: BP (!) 154/102 (BP Location: Right Arm)   Pulse 76   Temp 97.6 F (36.4 C) (Oral)   Resp 18   Ht  5' 9.5" (1.765 m)   Wt 87 kg (191 lb 12.8 oz)   SpO2 98%   BMI 27.92 kg/m   GENERAL :   Alert and cooperative, and appears to be in no acute distress. HEAD:           normocephalic. EYES:             PERRL, EOMI.  v EARS:           hearing grossly intact. NOSE:           No nasal discharge.  NECK:          supple CARDIAC:    Normal S1 and S2. No gallop. No murmurs.  Vascular:     no peripheral edema.  LUNGS:       Clear to auscultation  ABDOMEN: Positive bowel sounds. Soft, nondistended, nontender. No guarding or rebound.      EXT           : No significant deformity or joint abnormality. Neuro        : Alert, oriented to person, place, and time.                      CN II-XII intact.  SKIN:             Right heel wound/phlegmon w/o oozing, looks healing  PSYCH:       No hallucination. Patient is not suicidal.          Labs on Admission:  Reviewed.   Radiological Exams on Admission: No results found.   Assessment/Plan  Hypercalcemia:  Likely related to bone mets from melanoma and ca supplements Asymptomatic  Start IVF @ 200 cc/hr, goal at least 100 cc/hr urine output Started pamidronate 60 mg IV once Monitor I/O Consult nephrology  AKI:  Likely related to above Monitor I/O, continue IVF and repeat Cr and electrolytes in am.   Melanoma w/mets to lung/bone/brain:  Currently on chemotherapy with poor response / progression of disease. To be followed up with oncology as outpatient. ( please check note from oncologist earlier today).   Hx of CVA: continue asp and statin.   Fatigue:  Likely related to cancer/chemotherapy, also has hx of hypothyroidism and hypotesteron.  HTN: cont home meds.   Depression: cont home meds  CAD: s/p CABG, continue asp and statin and BB.    Input & Output: ordered  Lines & Tubes: PIV DVT prophylaxis: SCDs holding enoxaparin for tonight GI prophylaxis: NA Consultants: Nephro Code Status: Full for now ( discussion with oncology) Family Communication: at bedside Disposition Plan: TBD    Gennaro Africa M.D [facility]

## 2016-11-02 DIAGNOSIS — N179 Acute kidney failure, unspecified: Secondary | ICD-10-CM

## 2016-11-02 DIAGNOSIS — N189 Chronic kidney disease, unspecified: Secondary | ICD-10-CM

## 2016-11-02 DIAGNOSIS — C7800 Secondary malignant neoplasm of unspecified lung: Secondary | ICD-10-CM

## 2016-11-02 DIAGNOSIS — E039 Hypothyroidism, unspecified: Secondary | ICD-10-CM

## 2016-11-02 DIAGNOSIS — C7931 Secondary malignant neoplasm of brain: Secondary | ICD-10-CM

## 2016-11-02 DIAGNOSIS — C770 Secondary and unspecified malignant neoplasm of lymph nodes of head, face and neck: Secondary | ICD-10-CM

## 2016-11-02 DIAGNOSIS — Z8673 Personal history of transient ischemic attack (TIA), and cerebral infarction without residual deficits: Secondary | ICD-10-CM

## 2016-11-02 DIAGNOSIS — I69111 Memory deficit following nontraumatic intracerebral hemorrhage: Secondary | ICD-10-CM

## 2016-11-02 DIAGNOSIS — T66XXXA Radiation sickness, unspecified, initial encounter: Secondary | ICD-10-CM

## 2016-11-02 DIAGNOSIS — C7951 Secondary malignant neoplasm of bone: Secondary | ICD-10-CM

## 2016-11-02 DIAGNOSIS — R5383 Other fatigue: Secondary | ICD-10-CM

## 2016-11-02 DIAGNOSIS — E291 Testicular hypofunction: Secondary | ICD-10-CM

## 2016-11-02 DIAGNOSIS — I10 Essential (primary) hypertension: Secondary | ICD-10-CM

## 2016-11-02 DIAGNOSIS — I69151 Hemiplegia and hemiparesis following nontraumatic intracerebral hemorrhage affecting right dominant side: Secondary | ICD-10-CM

## 2016-11-02 DIAGNOSIS — C439 Malignant melanoma of skin, unspecified: Secondary | ICD-10-CM

## 2016-11-02 DIAGNOSIS — N183 Chronic kidney disease, stage 3 (moderate): Secondary | ICD-10-CM

## 2016-11-02 LAB — COMPREHENSIVE METABOLIC PANEL
ALK PHOS: 41 U/L (ref 38–126)
ALT: 13 U/L — AB (ref 17–63)
AST: 14 U/L — AB (ref 15–41)
Albumin: 3.1 g/dL — ABNORMAL LOW (ref 3.5–5.0)
Anion gap: 8 (ref 5–15)
BUN: 48 mg/dL — AB (ref 6–20)
CALCIUM: 11.1 mg/dL — AB (ref 8.9–10.3)
CHLORIDE: 108 mmol/L (ref 101–111)
CO2: 26 mmol/L (ref 22–32)
CREATININE: 1.69 mg/dL — AB (ref 0.61–1.24)
GFR, EST AFRICAN AMERICAN: 43 mL/min — AB (ref >60)
GFR, EST NON AFRICAN AMERICAN: 37 mL/min — AB (ref >60)
Glucose, Bld: 91 mg/dL (ref 65–99)
Potassium: 3.8 mmol/L (ref 3.5–5.1)
Sodium: 142 mmol/L (ref 135–145)
Total Bilirubin: 0.7 mg/dL (ref 0.3–1.2)
Total Protein: 5.7 g/dL — ABNORMAL LOW (ref 6.5–8.1)

## 2016-11-02 LAB — CBC
HCT: 39 % (ref 39.0–52.0)
Hemoglobin: 13.4 g/dL (ref 13.0–17.0)
MCH: 31.5 pg (ref 26.0–34.0)
MCHC: 34.4 g/dL (ref 30.0–36.0)
MCV: 91.8 fL (ref 78.0–100.0)
PLATELETS: 167 10*3/uL (ref 150–400)
RBC: 4.25 MIL/uL (ref 4.22–5.81)
RDW: 14.5 % (ref 11.5–15.5)
WBC: 6.3 10*3/uL (ref 4.0–10.5)

## 2016-11-02 MED ORDER — HYDRALAZINE HCL 20 MG/ML IJ SOLN
10.0000 mg | Freq: Once | INTRAMUSCULAR | Status: AC
  Administered 2016-11-02: 10 mg via INTRAVENOUS
  Filled 2016-11-02: qty 0.5

## 2016-11-02 MED ORDER — SODIUM CHLORIDE 0.9 % IV SOLN
INTRAVENOUS | Status: AC
  Administered 2016-11-02: 16:00:00 via INTRAVENOUS

## 2016-11-02 NOTE — Progress Notes (Addendum)
PROGRESS NOTE    David [name]  IEP:[id number] DOB: 1940/08/19 DOA: 11/01/2016 PCP: [name], MD  Brief Narrative:Pt is a 76 yo male with hx of metastatic melanoma s/p surgical solitary brain lesion resection and chemotherapy with mets to bone and lung, HTN, hypothyroidism, who was sent from oncologist office today due to hypercalcemia. Pt said he has been feeling fatigue and tired recently    Assessment & Plan:   Principal Problem:    Hypercalcemia  -due to malignancy/bone mets etc  -improving with aggressive IVF and given Pamidronate x1 yesterday -repeat in am  -cut down IVF   Aki on CKD 3 -improving, continue IVF today -suspect poor PO intake and Chemo combination contributing -cut down rate    Widely metastatic melanoma -mets to brain,  lung, bone, lymph nodes -foll by Dr.Feng -overall prognosis appears quite poor -add ensure, Pt eval    H/o hemorrhagic stroke and brain mets -s/p surgery and XRT -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 per Rad Onc -on ASA/statin    H/o CAD s/p CABG -continue ASA/Coreg/statin  DVT prophylaxis: SCDs Code Status: Full COde,  Family Communication: None at bedside Disposition Plan: Home tomorrow if stable  Consultants:   Onc Dr.Feng   Subjective: Feels better still weak  Objective: Vitals:   11/01/16 2126 11/02/16 0553 11/02/16 0557 11/02/16 0743  BP: (!) 148/91 (!) 164/105 (!) 160/100 (!) 150/90  Pulse: 77 75    Resp: 18 18    Temp: (!) 97.5 F (36.4 C) 98.7 F (37.1 C)    TempSrc: Oral Oral    SpO2: 97% 96%    Weight:      Height:        Intake/Output Summary (Last 24 hours) at 11/02/16 1148 Last data filed at 11/02/16 0958  Gross per 24 hour  Intake          1666.67 ml  Output             2275 ml  Net          -608.33 ml   Filed Weights   11/01/16 1815  Weight: 87 kg (191 lb 12.8 oz)    Examination:  General exam: Appears calm and comfortable, frail and chronically ill appearing Respiratory  system: Clear to auscultation. Respiratory effort normal. Cardiovascular system: S1 & S2 heard, RRR Gastrointestinal system: Abdomen is nondistended, soft and nontender. Normal bowel sounds heard. Central nervous system: Alert and oriented. No focal neurological deficits, mild cognitive slowing Extremities: Symmetric 5 x 5 power. Skin: No rashes, lesions or ulcers Psychiatry: flat affect    Data Reviewed:   CBC:  Recent Labs Lab 11/01/16 1544 11/02/16 0357  WBC 7.0 6.3  NEUTROABS 5.3  --   HGB 15.6 13.4  HCT 45.9 39.0  MCV 95.2 91.8  PLT 186 660   Basic Metabolic Panel:  Recent Labs Lab 11/01/16 1544 11/02/16 0357  NA 143 142  K 4.5 3.8  CL  --  108  CO2 31* 26  GLUCOSE 90 91  BUN 54.1* 48*  CREATININE 2.0* 1.69*  CALCIUM 13.4* 11.1*  MG 2.2  --    GFR: Estimated Creatinine Clearance: 40.3 mL/min (A) (by C-G formula based on SCr of 1.69 mg/dL (H)). Liver Function Tests:  Recent Labs Lab 11/01/16 1544 11/02/16 0357  AST 17 14*  ALT 15 13*  ALKPHOS 62 41  BILITOT 0.77 0.7  PROT 7.1 5.7*  ALBUMIN 3.6 3.1*   No results for input(s): LIPASE, AMYLASE in the last  168 hours. No results for input(s): AMMONIA in the last 168 hours. Coagulation Profile: No results for input(s): INR, PROTIME in the last 168 hours. Cardiac Enzymes: No results for input(s): CKTOTAL, CKMB, CKMBINDEX, TROPONINI in the last 168 hours. BNP (last 3 results) No results for input(s): PROBNP in the last 8760 hours. HbA1C: No results for input(s): HGBA1C in the last 72 hours. CBG: No results for input(s): GLUCAP in the last 168 hours. Lipid Profile: No results for input(s): CHOL, HDL, LDLCALC, TRIG, CHOLHDL, LDLDIRECT in the last 72 hours. Thyroid Function Tests: No results for input(s): TSH, T4TOTAL, FREET4, T3FREE, THYROIDAB in the last 72 hours. Anemia Panel: No results for input(s): VITAMINB12, FOLATE, FERRITIN, TIBC, IRON, RETICCTPCT in the last 72 hours. Urine analysis:      Component Value Date/Time   COLORURINE YELLOW 08/28/2015 2247   APPEARANCEUR CLOUDY (A) 08/28/2015 2247   LABSPEC 1.015 08/28/2015 2247   PHURINE 5.5 08/28/2015 2247   GLUCOSEU NEGATIVE 08/28/2015 2247   HGBUR SMALL (A) 08/28/2015 2247   BILIRUBINUR NEGATIVE 08/28/2015 2247   [place] 08/28/2015 2247   PROTEINUR 30 (A) 08/28/2015 2247   NITRITE NEGATIVE 08/28/2015 2247   LEUKOCYTESUR NEGATIVE 08/28/2015 2247   Sepsis Labs: @LABRCNTIP (procalcitonin:4,lacticidven:4)  )No results found for this or any previous visit (from the past 240 hour(s)).       Radiology Studies: No results found.      Scheduled Meds: . aspirin EC  81 mg Oral Daily  . carvedilol  6.25 mg Oral BID WC  . dexamethasone  4 mg Oral Daily  . feeding supplement (ENSURE ENLIVE)  237 mL Oral BID BM  . levothyroxine  175 mcg Oral QAC breakfast  . pentoxifylline  400 mg Oral BID  . rosuvastatin  10 mg Oral QHS  . sertraline  100 mg Oral Daily   Continuous Infusions: . sodium chloride 100 mL/hr at 11/02/16 1009     LOS: 1 day    Time spent: 47min    Domenic Polite, MD [facility] Pager [phone]  If 7PM-7AM, please contact night-coverage [email or web address] Password [facility] 11/02/2016, 11:48 AM

## 2016-11-02 NOTE — Progress Notes (Signed)
David [name]   DOB:[birth year]   TI#:[id number]   QQP#:[id number]  Oncology follow-up note  Subjective: Patient is well-known to me, was admitted from my office to [facility] yesterday for hypercalcemia and acute on chronic renal failure. He received IV fluids and pamidronate 60 mg last night, calcium came down to 11.1 today, renal function also improved. He did not sleep well last night, does not feel much better. No other new complaints. He has difficulty to recall the name of his medications, which is not new.    Objective:  Vitals:   11/02/16 0557 11/02/16 0743  BP: (!) 160/100 (!) 150/90  Pulse:    Resp:    Temp:      Body mass index is 27.92 kg/m.  Intake/Output Summary (Last 24 hours) at 11/02/16 0833 Last data filed at 11/02/16 0622  Gross per 24 hour  Intake          1366.67 ml  Output             1550 ml  Net          -183.33 ml     Sclerae unicteric  Oropharynx clear  No peripheral adenopathy  Lungs clear -- no rales or rhonchi  Heart regular rate and rhythm  Abdomen benign  MSK no focal spinal tenderness, no peripheral edema  Neuro nonfocal   CBG (last 3)  No results for input(s): GLUCAP in the last 72 hours.   Labs:  Lab Results  Component Value Date   WBC 6.3 11/02/2016   HGB 13.4 11/02/2016   HCT 39.0 11/02/2016   MCV 91.8 11/02/2016   PLT 167 11/02/2016   NEUTROABS 5.3 11/01/2016   CMP Latest Ref Rng & Units 11/02/2016 11/01/2016 10/19/2016  Glucose 65 - 99 mg/dL 91 90 146(H)  BUN 6 - 20 mg/dL 48(H) 54.1(H) 41.6(H)  Creatinine 0.61 - 1.24 mg/dL 1.69(H) 2.0(H) 1.3  Sodium 135 - 145 mmol/L 142 143 143  Potassium 3.5 - 5.1 mmol/L 3.8 4.5 4.4  Chloride 101 - 111 mmol/L 108 - -  CO2 22 - 32 mmol/L 26 31(H) 27  Calcium 8.9 - 10.3 mg/dL 11.1(H) 13.4(HH) 10.0  Total Protein 6.5 - 8.1 g/dL 5.7(L) 7.1 6.4  Total Bilirubin 0.3 - 1.2 mg/dL 0.7 0.77 0.57  Alkaline Phos 38 - 126 U/L 41 62 77  AST 15 - 41 U/L 14(L) 17 17  ALT 17 - 63 U/L 13(L) 15 18      Urine Studies No results for input(s): UHGB, CRYS in the last 72 hours.  Invalid input(s): UACOL, UAPR, USPG, UPH, UTP, UGL, UKET, UBIL, UNIT, UROB, ULEU, UEPI, UWBC, URBC, UBAC, CAST, UCOM, BILUA  Basic Metabolic Panel:  Recent Labs Lab 11/01/16 1544 11/02/16 0357  NA 143 142  K 4.5 3.8  CL  --  108  CO2 31* 26  GLUCOSE 90 91  BUN 54.1* 48*  CREATININE 2.0* 1.69*  CALCIUM 13.4* 11.1*  MG 2.2  --    GFR Estimated Creatinine Clearance: 40.3 mL/min (A) (by C-G formula based on SCr of 1.69 mg/dL (H)). Liver Function Tests:  Recent Labs Lab 11/01/16 1544 11/02/16 0357  AST 17 14*  ALT 15 13*  ALKPHOS 62 41  BILITOT 0.77 0.7  PROT 7.1 5.7*  ALBUMIN 3.6 3.1*   No results for input(s): LIPASE, AMYLASE in the last 168 hours. No results for input(s): AMMONIA in the last 168 hours. Coagulation profile No results for input(s): INR, PROTIME in the last 168  hours.  CBC:  Recent Labs Lab 11/01/16 1544 11/02/16 0357  WBC 7.0 6.3  NEUTROABS 5.3  --   HGB 15.6 13.4  HCT 45.9 39.0  MCV 95.2 91.8  PLT 186 167   Cardiac Enzymes: No results for input(s): CKTOTAL, CKMB, CKMBINDEX, TROPONINI in the last 168 hours. BNP: Invalid input(s): POCBNP CBG: No results for input(s): GLUCAP in the last 168 hours. D-Dimer No results for input(s): DDIMER in the last 72 hours. Hgb A1c No results for input(s): HGBA1C in the last 72 hours. Lipid Profile No results for input(s): CHOL, HDL, LDLCALC, TRIG, CHOLHDL, LDLDIRECT in the last 72 hours. Thyroid function studies No results for input(s): TSH, T4TOTAL, T3FREE, THYROIDAB in the last 72 hours.  Invalid input(s): FREET3 Anemia work up No results for input(s): VITAMINB12, FOLATE, FERRITIN, TIBC, IRON, RETICCTPCT in the last 72 hours. Microbiology No results found for this or any previous visit (from the past 240 hour(s)).    Studies:  No results found.  Assessment: 76 y.o. with past medical history of metastatic  melanoma to brain, lung, lymph nodes and bone, was admitted to hospital for hypercalcemia and acute on chronic renal failure.  1. Hypercalcemia, secondary to his malignancy, improved 2. Acute on chronic renal insufficiency, improved  3. Metastatic melanoma to lung, nodes, bone and brain 4. Radionecrosis, on chronic steroids dexa 4mg  daily  5. Hemorrhagic stroke and a brain metastasis, status post surgery and radiation 6. Memory loss secondary tp #4 and #5 7. Fatigue, hypothyroidism and hypotestosteronemia   Plan:  -His calcium has much improved, so does her creatinine, continue IV fluids -If the repeated calcium and creatinine continued to improve in the afternoon or tomorrow morning, he can potentially be discharged afterwards -I'll follow-up his calcium level closely in early next week -continue holding dabrafenib for now  -continue dexa 4mg  daily for now, will discuss tapering with Dr. Tammi Klippel  -I appreciate the excellent care from the hospitalist team    [name], MD 11/02/2016  8:33 AM

## 2016-11-02 NOTE — Progress Notes (Addendum)
Pt has tele order according to the on call hospitalist K.Schorr, if pt has a Med-Surg bed request order the nurse can discontinue the order. This nurse left order because this is out of scope of practice to discontinue order.

## 2016-11-02 NOTE — Progress Notes (Signed)
Initial Nutrition Assessment  DOCUMENTATION CODES:   Not applicable  INTERVENTION:   Ensure Enlive po BID, each supplement provides 350 kcal and 20 grams of protein  NUTRITION DIAGNOSIS:   Increased nutrient needs related to cancer and cancer related treatments as evidenced by increased estimated needs from protein.  GOAL:   Patient will meet greater than or equal to 90% of their needs  MONITOR:   PO intake, Supplement acceptance, Labs, Weight trends  REASON FOR ASSESSMENT:   Malnutrition Screening Tool    ASSESSMENT:   76 yo male with hx of metastatic melanoma s/p surgical solitary brain lesion resection and chemotherapy with mets to bone and lung, HTN, hypothyroidism, who was sent from oncologist office today due to hypercalcemia.    Met with pt in room today. Pt reports good appetite today and pta. Pt eating 100% of meals currently. Pt reports that he is usually a good eater at home and that he does not usually drink supplements. Per chart, pt has lost 9lbs(5%) in 2 weeks; this is severe. RD discussed with pt the importance of adequate protein intake in order to preserve lean muscle mass. Pt would like to try Ensure.   Medications reviewed and include: aspirin, dexamethasone, synthroid  Labs reviewed: BUN 48(H), creat 1.69(H), Ca 11.1(H) adj. 11.82(H), alb 3.1(L)  Nutrition-Focused physical exam completed. Findings are no fat depletion, no muscle depletion, and no edema.   Diet Order:  Diet regular Room service appropriate? Yes; Fluid consistency: Thin  Skin:  Reviewed, no issues  Last BM:  7/25  Height:   Ht Readings from Last 1 Encounters:  11/01/16 5' 9.5" (1.765 m)    Weight:   Wt Readings from Last 1 Encounters:  11/01/16 191 lb 12.8 oz (87 kg)    Ideal Body Weight:  74 kg  BMI:  Body mass index is 27.92 kg/m.  Estimated Nutritional Needs:   Kcal:  2000-2300kcal/day   Protein:  87-104g/day  Fluid:  >2L/day   EDUCATION NEEDS:   Education  needs addressed  Koleen Distance MS, RD, LDN Pager #[phone] After Hours Pager: [phone]

## 2016-11-03 LAB — COMPREHENSIVE METABOLIC PANEL
ALBUMIN: 3.1 g/dL — AB (ref 3.5–5.0)
ALT: 13 U/L — ABNORMAL LOW (ref 17–63)
ANION GAP: 6 (ref 5–15)
AST: 16 U/L (ref 15–41)
Alkaline Phosphatase: 42 U/L (ref 38–126)
BILIRUBIN TOTAL: 0.7 mg/dL (ref 0.3–1.2)
BUN: 47 mg/dL — ABNORMAL HIGH (ref 6–20)
CHLORIDE: 110 mmol/L (ref 101–111)
CO2: 26 mmol/L (ref 22–32)
Calcium: 10.5 mg/dL — ABNORMAL HIGH (ref 8.9–10.3)
Creatinine, Ser: 1.96 mg/dL — ABNORMAL HIGH (ref 0.61–1.24)
GFR calc Af Amer: 36 mL/min — ABNORMAL LOW (ref >60)
GFR calc non Af Amer: 31 mL/min — ABNORMAL LOW (ref >60)
GLUCOSE: 109 mg/dL — AB (ref 65–99)
POTASSIUM: 3.8 mmol/L (ref 3.5–5.1)
SODIUM: 142 mmol/L (ref 135–145)
TOTAL PROTEIN: 5.7 g/dL — AB (ref 6.5–8.1)

## 2016-11-03 NOTE — Progress Notes (Addendum)
David [name]   DOB:March 06, 1941   XN#:[id number]   URK#:[id number]  Oncology follow-up note  Subjective: Pt is stable, still feels weak, has not walked much, spent most time in bed, did not sleep very well. Denies pain or other new symptoms.   Objective:  Vitals:   11/03/16 0539 11/03/16 1430  BP: (!) 142/98 (!) 156/84  Pulse: 77 73  Resp: 17 18  Temp: 98.3 F (36.8 C) 97.8 F (36.6 C)    Body mass index is 27.92 kg/m.  Intake/Output Summary (Last 24 hours) at 11/03/16 1646 Last data filed at 11/03/16 1628  Gross per 24 hour  Intake             4413 ml  Output              880 ml  Net             3533 ml     Sclerae unicteric  Oropharynx clear  No peripheral adenopathy  Lungs clear -- no rales or rhonchi  Heart regular rate and rhythm  Abdomen benign  MSK no focal spinal tenderness, no peripheral edema  Neuro nonfocal, (+) short-term memory loss, speech little slow    CBG (last 3)  No results for input(s): GLUCAP in the last 72 hours.   Labs:  Lab Results  Component Value Date   WBC 6.3 11/02/2016   HGB 13.4 11/02/2016   HCT 39.0 11/02/2016   MCV 91.8 11/02/2016   PLT 167 11/02/2016   NEUTROABS 5.3 11/01/2016   CMP Latest Ref Rng & Units 11/03/2016 11/02/2016 11/01/2016  Glucose 65 - 99 mg/dL 109(H) 91 90  BUN 6 - 20 mg/dL 47(H) 48(H) 54.1(H)  Creatinine 0.61 - 1.24 mg/dL 1.96(H) 1.69(H) 2.0(H)  Sodium 135 - 145 mmol/L 142 142 143  Potassium 3.5 - 5.1 mmol/L 3.8 3.8 4.5  Chloride 101 - 111 mmol/L 110 108 -  CO2 22 - 32 mmol/L 26 26 31(H)  Calcium 8.9 - 10.3 mg/dL 10.5(H) 11.1(H) 13.4(HH)  Total Protein 6.5 - 8.1 g/dL 5.7(L) 5.7(L) 7.1  Total Bilirubin 0.3 - 1.2 mg/dL 0.7 0.7 0.77  Alkaline Phos 38 - 126 U/L 42 41 62  AST 15 - 41 U/L 16 14(L) 17  ALT 17 - 63 U/L 13(L) 13(L) 15     Urine Studies No results for input(s): UHGB, CRYS in the last 72 hours.  Invalid input(s): UACOL, UAPR, USPG, UPH, UTP, UGL, UKET, UBIL, UNIT, UROB, ULEU, UEPI, UWBC, [place],  [place], [place], [place], [place]  Basic Metabolic Panel:  Recent Labs Lab 11/01/16 1544 11/02/16 0357 11/03/16 0337  NA 143 142 142  K 4.5 3.8 3.8  CL  --  108 110  CO2 31* 26 26  GLUCOSE 90 91 109*  BUN 54.1* 48* 47*  CREATININE 2.0* 1.69* 1.96*  CALCIUM 13.4* 11.1* 10.5*  MG 2.2  --   --    GFR Estimated Creatinine Clearance: 34.8 mL/min (A) (by C-G formula based on SCr of 1.96 mg/dL (H)). Liver Function Tests:  Recent Labs Lab 11/01/16 1544 11/02/16 0357 11/03/16 0337  AST 17 14* 16  ALT 15 13* 13*  ALKPHOS 62 41 42  BILITOT 0.77 0.7 0.7  PROT 7.1 5.7* 5.7*  ALBUMIN 3.6 3.1* 3.1*   No results for input(s): LIPASE, AMYLASE in the last 168 hours. No results for input(s): AMMONIA in the last 168 hours. Coagulation profile No results for input(s): INR, PROTIME in the last 168 hours.  CBC:  Recent Labs  Lab 11/01/16 1544 11/02/16 0357  WBC 7.0 6.3  NEUTROABS 5.3  --   HGB 15.6 13.4  HCT 45.9 39.0  MCV 95.2 91.8  PLT 186 167   Cardiac Enzymes: No results for input(s): CKTOTAL, CKMB, CKMBINDEX, TROPONINI in the last 168 hours. BNP: Invalid input(s): POCBNP CBG: No results for input(s): GLUCAP in the last 168 hours. D-Dimer No results for input(s): DDIMER in the last 72 hours. Hgb A1c No results for input(s): HGBA1C in the last 72 hours. Lipid Profile No results for input(s): CHOL, HDL, LDLCALC, TRIG, CHOLHDL, LDLDIRECT in the last 72 hours. Thyroid function studies No results for input(s): TSH, T4TOTAL, T3FREE, THYROIDAB in the last 72 hours.  Invalid input(s): FREET3 Anemia work up No results for input(s): VITAMINB12, FOLATE, FERRITIN, TIBC, IRON, RETICCTPCT in the last 72 hours. Microbiology No results found for this or any previous visit (from the past 240 hour(s)).    Studies:  No results found.  Assessment: 76 y.o. with past medical history of metastatic melanoma to brain, lung, lymph nodes and bone, was admitted to hospital for hypercalcemia and  acute on chronic renal failure.  1. Hypercalcemia, secondary to his malignancy, improved 2. Acute on chronic renal insufficiency, improved  3. Metastatic melanoma to lung, nodes, bone and brain 4. Radionecrosis, on chronic steroids dexa 2-4mg  daily  5. Hemorrhagic stroke and brain metastasis, status post surgery and radiation 6. Memory loss secondary tp #4 and #5 7. Fatigue, hypothyroidism and hypotestosteronemia   Plan:  -His calcium has much improved, Cr slightly up again, I encourage him to drink more water  -Pt has agreed with discharge to SNF for rehabilitation, which I think it's important for him to recover  -I have spoke with Dr. Tammi Klippel, he was on dexa 2mg  daily before admission, has been on 4mg  since admission,  please continue 4mg  daily for now until I see him back in one week  -Plan to taper his dexa off in the next 4-6 weeks  -I will hold off his systemic therapy for now, likely restart Nivo in 3-4 weeks when he recovers better  -I will update his daughter  -I will see him in one week with lab and Xgeva injection, I recommend the rehab to repeat BMP on 7/30 or 7/31 and fax the result to my office at [phone]    [name], MD 11/03/2016  4:46 PM

## 2016-11-03 NOTE — Progress Notes (Signed)
Pt going to SNF, CSW following.

## 2016-11-03 NOTE — Progress Notes (Signed)
Patient will discharge to [facility] SNF for rehab 7/28 Patient will got to room 22, daughter notified.  Nurse call report to: [phone] Weekend CSW will assist with discharge.  Kathrin Greathouse, Latanya Presser, MSW Clinical Social Worker [place] [facility] [phone] 11/03/2016  3:52 PM

## 2016-11-03 NOTE — Evaluation (Signed)
Physical Therapy Evaluation Patient Details Name: David [name] MRN: [id number] DOB: 1940/12/13 Today's Date: 11/03/2016   History of Present Illness  Pt is a 76 yo male with hx of metastatic melanoma s/p surgical solitary brain lesion resection and chemotherapy with mets to bone and lung, HTN, hypothyroidism, who was sent from oncologist office 11/01/16 due to hypercalcemia.   Clinical Impression  The patient ambulated with RW x 440'. Noted decreased balance responses and at risk for fall. Patient reports that sister helps out and wife unable. May benefit from Rehab stay if agreeable.     Follow Up Recommendations Home health PT;Supervision/Assistance - 24 hour; versus SNF    Equipment Recommendations  None recommended by PT    Recommendations for Other Services   OT    Precautions / Restrictions Precautions Precautions: Fall      Mobility  Bed Mobility Overal bed mobility: Needs Assistance Bed Mobility: Supine to Sit     Supine to sit: Min guard     General bed mobility comments: extra time to move trunk to upright  Transfers Overall transfer level: Needs assistance Equipment used: 1 person hand held assist Transfers: Sit to/from Stand Sit to Stand: Min guard         General transfer comment: use of rail and RW to pull to stand up.  Ambulation/Gait Ambulation/Gait assistance: Min assist Ambulation Distance (Feet): 440 Feet Assistive device: Rolling walker (2 wheeled) Gait Pattern/deviations: Shuffle Gait velocity: decr   General Gait Details: the patient is motivated to ambulate. Gait is unsteady at times, assist to veer from objects, delayed to change directions and back up. Balance loss x 1 when patient  got in front of recliner and let go of the RW to lean forward to reach to armrest. Steady assist to maintain balance by PT  Stairs            Wheelchair Mobility    Modified Rankin (Stroke Patients Only)       Balance Overall balance  assessment: Needs assistance Sitting-balance support: Feet supported;No upper extremity supported Sitting balance-Leahy Scale: Good     Standing balance support: During functional activity;No upper extremity supported Standing balance-Leahy Scale: Poor                               Pertinent Vitals/Pain Pain Assessment: No/denies pain    Home Living Family/patient expects to be discharged to:: Private residence Living Arrangements: Spouse/significant other;Other relatives Available Help at Discharge: Family Type of Home: House Home Access: Level entry     Home Layout: One level   Additional Comments: resides at [facility], sister lives close by and helps out. wife has dementia.    Prior Function Level of Independence: Independent               Hand Dominance        Extremity/Trunk Assessment   Upper Extremity Assessment Upper Extremity Assessment: Generalized weakness    Lower Extremity Assessment Lower Extremity Assessment: Generalized weakness    Cervical / Trunk Assessment Cervical / Trunk Assessment: Kyphotic  Communication   Communication: Expressive difficulties  Cognition Arousal/Alertness: Awake/alert Behavior During Therapy: WFL for tasks assessed/performed                                   General Comments: patient states" since  I had brain surgery, i can't  remeber anything." patient oriented to palce and day      General Comments      Exercises     Assessment/Plan    PT Assessment Patient needs continued PT services  PT Problem List Decreased strength;Decreased activity tolerance;Decreased balance;Decreased mobility;Decreased knowledge of precautions;Decreased safety awareness;Decreased knowledge of use of DME       PT Treatment Interventions DME instruction;Gait training;Functional mobility training;Therapeutic activities;Therapeutic exercise;Patient/family education    PT Goals (Current  goals can be found in the Care Plan section)  Acute Rehab PT Goals Patient Stated Goal: wants to walk and get better. PT Goal Formulation: With patient Time For Goal Achievement: 11/17/16 Potential to Achieve Goals: Good    Frequency Min 3X/week   Barriers to discharge Decreased caregiver support      Co-evaluation               AM-PAC PT "6 Clicks" Daily Activity  Outcome Measure Difficulty turning over in bed (including adjusting bedclothes, sheets and blankets)?: Total Difficulty moving from lying on back to sitting on the side of the bed? : Total Difficulty sitting down on and standing up from a chair with arms (e.g., wheelchair, bedside commode, etc,.)?: Total Help needed moving to and from a bed to chair (including a wheelchair)?: Total Help needed walking in hospital room?: Total Help needed climbing 3-5 steps with a railing? : Total 6 Click Score: 6    End of Session Equipment Utilized During Treatment: Gait belt Activity Tolerance: Patient tolerated treatment well Patient left: in chair;with call bell/phone within reach;with family/visitor present;with chair alarm set Nurse Communication: Mobility status PT Visit Diagnosis: Difficulty in walking, not elsewhere classified (R26.2);Unsteadiness on feet (R26.81)    Time: 9688-6484 PT Time Calculation (min) (ACUTE ONLY): 17 min   Charges:   PT Evaluation $PT Eval Low Complexity: 1 Procedure     PT G CodesTresa Endo PT [phone]  Claretha Cooper 11/03/2016, 11:07 AM

## 2016-11-03 NOTE — Clinical Social Work Note (Signed)
Clinical Social Work Assessment  Patient Details  Name: David [name] MRN: [id number] Date of Birth: 12/29/1940  Date of referral:  11/03/16               Reason for consult:  Facility Placement                Permission sought to share information with:  Family Supports Permission granted to share information::     Name::        Agency::     Relationship::  Daughter  Contact Information:  [phone]  Housing/Transportation Living arrangements for the past 2 months:  Independent Living at [facility] Source of Information:  Patient Patient Interpreter Needed:  None Criminal Activity/Legal Involvement Pertinent to Current Situation/Hospitalization:  No - Comment as needed Significant Relationships:  Adult Children, Spouse Lives with:  Spouse Do you feel safe going back to the place where you live?  Yes Need for family participation in patient care:  Yes (Comment)  Care giving concerns:  Weakness, need for rehab before retuning to his Independent Living.    Social Worker assessment / plan:  CSW met with patient at bedside, explain role and reason for consult- to assist with SNF placement. Patient reports he would like to complete short term rehab before going home. Patient reports he lives at home with his spouse and needs assistance sometime with his ADL's. The patient reports this is is first time going to rehab placement and is interested in a facility close to his home. CSW educated patient about SNF faxing out process and  Later providing list of bed offer. Patient gave CSW permission to contact his daughter( Dr. [name] Leggett) who is currently out of town.   Plan: Complete FL2, PASRR, Provide bed offers.   Employment status:  Retired Insurance information:  Medicare PT Recommendations:  Skilled Nursing Facility, Home with Home Health Information / Referral to community resources:  Skilled Nursing Facility  Patient/Family's Response to care:  Agreeable to  care  Patient/Family's Understanding of and Emotional Response to Diagnosis, Current Treatment, and Prognosis:  "He lives at [facility] and can use rehab before going back."- Daughter very involved in care, and understands pt. Current treatment and needed care.  Emotional Assessment Appearance:  Developmentally appropriate Attitude/Demeanor/Rapport:    Affect (typically observed):  Accepting, Calm, Pleasant Orientation:  Oriented to Self, Oriented to Place, Oriented to  Time, Oriented to Situation Alcohol / Substance use:  Not Applicable Psych involvement (Current and /or in the community):  No (Comment)  Discharge Needs  Concerns to be addressed:  Discharge Planning Concerns Readmission within the last 30 days:  No Current discharge risk:  None Barriers to Discharge:  Continued Medical Work up    A , LCSW 11/03/2016, 1:19 PM  

## 2016-11-03 NOTE — Progress Notes (Signed)
PROGRESS NOTE    David [name]  EZM:[id number] DOB: [birth year] DOA: 11/01/2016 PCP: [name], MD  Brief Narrative:Pt is a 76 yo male with hx of metastatic melanoma s/p surgical solitary brain lesion resection and chemotherapy with mets to bone and lung, HTN, hypothyroidism, who was sent from oncologist office due to hypercalcemia. Pt said he has been feeling fatigue and tired recently  Ca corrected with Pamidronate and IVF, Physically weak Pt recommending HHPT vs SNF  Assessment & Plan:    Hypercalcemia  -due to malignancy/bone mets etc  -improving with aggressive IVF and given Pamidronate x1  -Ca down to 10.5 now   Aki on CKD 3 -improving, and then plateued -suspect poor PO intake and Chemo combination (dabrafenib and recent trametinib) contributing -stopped IVF, adequately hydrated    Widely metastatic melanoma -mets to brain,  lung, bone, lymph nodes -foll by Dr.Feng -overall prognosis appears quite poor -added ensure, Pt eval completed, SNF being considered    H/o hemorrhagic stroke and brain mets -s/p surgery and XRT -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 per Rad Onc -on ASA/statin    H/o CAD s/p CABG -continue ASA/Coreg/statin  DVT prophylaxis: SCDs Code Status: Full COde,  Family Communication: None at bedside, called and d/w daughter Dr.Legette Disposition Plan: HHPT vs SNF if possible based on PT eval  Consultants:   Onc Dr.Feng   Subjective: Feels better still weak  Objective: Vitals:   11/02/16 0743 11/02/16 1318 11/02/16 2042 11/03/16 0539  BP: (!) 150/90 (!) 152/91 133/81 (!) 142/98  Pulse:  95 75 77  Resp:  16 16 17   Temp:  97.6 F (36.4 C) 98.1 F (36.7 C) 98.3 F (36.8 C)  TempSrc:  Oral Oral Oral  SpO2:  95% 93% 94%  Weight:      Height:        Intake/Output Summary (Last 24 hours) at 11/03/16 1302 Last data filed at 11/03/16 0945  Gross per 24 hour  Intake             4173 ml  Output             1330 ml  Net              2843 ml   Filed Weights   11/01/16 1815  Weight: 87 kg (191 lb 12.8 oz)    Examinatio Gen: Awake, Alert, Oriented to self and place, cognitive delay noted HEENT: PERRLA, Neck supple, no JVD Lungs: Good air movement bilaterally, CTAB CVS: RRR,No Gallops,Rubs or new Murmurs Abd: soft, Non tender, non distended, BS present Extremities: No Cyanosis, Clubbing or edema Skin: no new rashes Psych: flat affect    Data Reviewed:   CBC:  Recent Labs Lab 11/01/16 1544 11/02/16 0357  WBC 7.0 6.3  NEUTROABS 5.3  --   HGB 15.6 13.4  HCT 45.9 39.0  MCV 95.2 91.8  PLT 186 503   Basic Metabolic Panel:  Recent Labs Lab 11/01/16 1544 11/02/16 0357 11/03/16 0337  NA 143 142 142  K 4.5 3.8 3.8  CL  --  108 110  CO2 31* 26 26  GLUCOSE 90 91 109*  BUN 54.1* 48* 47*  CREATININE 2.0* 1.69* 1.96*  CALCIUM 13.4* 11.1* 10.5*  MG 2.2  --   --    GFR: Estimated Creatinine Clearance: 34.8 mL/min (A) (by C-G formula based on SCr of 1.96 mg/dL (H)). Liver Function Tests:  Recent Labs Lab 11/01/16 1544 11/02/16 0357 11/03/16 0337  AST 17 14* 16  ALT 15 13* 13*  ALKPHOS 62 41 42  BILITOT 0.77 0.7 0.7  PROT 7.1 5.7* 5.7*  ALBUMIN 3.6 3.1* 3.1*   No results for input(s): LIPASE, AMYLASE in the last 168 hours. No results for input(s): AMMONIA in the last 168 hours. Coagulation Profile: No results for input(s): INR, PROTIME in the last 168 hours. Cardiac Enzymes: No results for input(s): CKTOTAL, CKMB, CKMBINDEX, TROPONINI in the last 168 hours. BNP (last 3 results) No results for input(s): PROBNP in the last 8760 hours. HbA1C: No results for input(s): HGBA1C in the last 72 hours. CBG: No results for input(s): GLUCAP in the last 168 hours. Lipid Profile: No results for input(s): CHOL, HDL, LDLCALC, TRIG, CHOLHDL, LDLDIRECT in the last 72 hours. Thyroid Function Tests: No results for input(s): TSH, T4TOTAL, FREET4, T3FREE, THYROIDAB in the last 72 hours. Anemia Panel: No  results for input(s): VITAMINB12, FOLATE, FERRITIN, TIBC, IRON, RETICCTPCT in the last 72 hours. Urine analysis:    Component Value Date/Time   COLORURINE YELLOW 08/28/2015 2247   APPEARANCEUR CLOUDY (A) 08/28/2015 2247   LABSPEC 1.015 08/28/2015 2247   PHURINE 5.5 08/28/2015 2247   GLUCOSEU NEGATIVE 08/28/2015 2247   HGBUR SMALL (A) 08/28/2015 2247   BILIRUBINUR NEGATIVE 08/28/2015 2247   [place] 08/28/2015 2247   PROTEINUR 30 (A) 08/28/2015 2247   NITRITE NEGATIVE 08/28/2015 2247   LEUKOCYTESUR NEGATIVE 08/28/2015 2247   Sepsis Labs: @LABRCNTIP (procalcitonin:4,lacticidven:4)  )No results found for this or any previous visit (from the past 240 hour(s)).       Radiology Studies: No results found.      Scheduled Meds: . aspirin EC  81 mg Oral Daily  . carvedilol  6.25 mg Oral BID WC  . dexamethasone  4 mg Oral Daily  . feeding supplement (ENSURE ENLIVE)  237 mL Oral BID BM  . levothyroxine  175 mcg Oral QAC breakfast  . pentoxifylline  400 mg Oral BID  . rosuvastatin  10 mg Oral QHS  . sertraline  100 mg Oral Daily   Continuous Infusions:    LOS: 2 days    Time spent: 36min    Domenic Polite, MD [facility] Pager [phone]  If 7PM-7AM, please contact night-coverage [email or web address] Password [email or web address] 11/03/2016, 1:02 PM

## 2016-11-03 NOTE — NC FL2 (Signed)
[place] LEVEL OF CARE SCREENING TOOL     IDENTIFICATION  Patient Name: David [name] Birthdate: 05-27-1940 Sex: male Admission Date (Current Location): 11/01/2016  [facility] and [place] Number:  Herbalist and Address:  [facility],  [place], [place]      Provider Number: [id number]  Attending Physician Name and Address:  Domenic Polite, MD  Relative Name and Phone Number:       Current Level of Care: Hospital Recommended Level of Care: [place] Prior Approval Number:    Date Approved/Denied: 11/03/16 PASRR Number: [id number]  Discharge Plan: SNF    Current Diagnoses: Patient Active Problem List   Diagnosis Date Noted  . Hypercalcemia 11/01/2016  . Nontraumatic cortical hemorrhage of left cerebral hemisphere ([place]) 06/27/2016  . Hyperkalemia 05/17/2016  . Hematochezia 05/06/2016  . Goals of care, counseling/discussion 05/03/2016  . Hypothyroidism (acquired) 11/24/2015  . Hypotestosteronemia 11/24/2015  . Metastatic melanoma to lung ([place])   . Mood disorder ([place])   . Intracerebral hemorrhage ([place])   . BPH (benign prostatic hypertrophy)   . Metastasis to brain ([place]) 10/15/2015  . Metastatic melanoma ([place]) 09/22/2015  . Mass of lower lobe of left lung   . Chronic kidney disease, stage III (moderate)   . Hyperlipidemia 08/26/2015  . Obesity 08/26/2015  . Incontinence 08/26/2015  . Hemorrhagic stroke ([place]) 08/26/2015  . Hemiplegia and hemiparesis following nontraumatic intracerebral hemorrhage affecting right dominant side ([place]) 08/26/2015  . Aphasia following nontraumatic intracerebral hemorrhage 08/26/2015  . Gait disturbance, post-stroke 08/26/2015  . Benign essential HTN   . OSA (obstructive sleep apnea)   . CKD (chronic kidney disease)   . Pacemaker   . ICH (intracerebral hemorrhage) (H[facility]) - L frontal due to HTN vs CAA 08/22/2015    Orientation RESPIRATION BLADDER Height & Weight      Self, Time, Situation, Place  Normal Continent Weight: 191 lb 12.8 oz (87 kg) Height:  5' 9.5" (176.5 cm)  BEHAVIORAL SYMPTOMS/MOOD NEUROLOGICAL BOWEL NUTRITION STATUS      Continent Diet (See DC Summary)  AMBULATORY STATUS COMMUNICATION OF NEEDS Skin   Limited Assist Verbally Other (Comment), Normal (Body Weakness)                       Personal Care Assistance Level of Assistance              Functional Limitations Info             SPECIAL CARE FACTORS FREQUENCY  PT (By licensed PT)     PT Frequency: 3x week              Contractures      Additional Factors Info  Code Status, Allergies, Psychotropic Code Status Info: Full Code Allergies Info: ZOCOR SIMVASTATIN  Psychotropic Info: Zoloft         Current Medications (11/03/2016):  This is the current hospital active medication list Current Facility-Administered Medications  Medication Dose Route Frequency Provider Last Rate Last Dose  . aspirin EC tablet 81 mg  81 mg Oral Daily Gennaro Africa, MD   81 mg at 11/03/16 0946  . carvedilol (COREG) tablet 6.25 mg  6.25 mg Oral BID WC Gennaro Africa, MD   6.25 mg at 11/03/16 0834  . dexamethasone (DECADRON) tablet 4 mg  4 mg Oral Daily Gennaro Africa, MD   4 mg at 11/03/16 0946  . feeding supplement (ENSURE ENLIVE) (ENSURE ENLIVE) liquid 237 mL  237 mL Oral BID BM Gennaro Africa, MD   237 mL at 11/03/16 0946  . levothyroxine (SYNTHROID, LEVOTHROID) tablet 175 mcg  175 mcg Oral QAC breakfast Gennaro Africa, MD   175 mcg at 11/03/16 [phone]  . pentoxifylline (TRENTAL) CR tablet 400 mg  400 mg Oral BID Gennaro Africa, MD   400 mg at 11/03/16 0834  . rosuvastatin (CRESTOR) tablet 10 mg  10 mg Oral QHS Gennaro Africa, MD   10 mg at 11/02/16 2117  . sertraline (ZOLOFT) tablet 100 mg  100 mg Oral Daily Gennaro Africa, MD   100 mg at 11/03/16 3893   Facility-Administered Medications Ordered in Other Encounters  Medication Dose Route Frequency Provider Last Rate Last Dose  . 0.9 %   sodium chloride infusion  1,000 mL Intravenous Once [name], MD         Discharge Medications: Please see discharge summary for a list of discharge medications.  Relevant Imaging Results:  Relevant Lab Results:   Additional Information SS#:[id number] [place], LCSW

## 2016-11-04 DIAGNOSIS — I69320 Aphasia following cerebral infarction: Secondary | ICD-10-CM | POA: Diagnosis not present

## 2016-11-04 DIAGNOSIS — E039 Hypothyroidism, unspecified: Secondary | ICD-10-CM | POA: Diagnosis not present

## 2016-11-04 DIAGNOSIS — C4370 Malignant melanoma of unspecified lower limb, including hip: Secondary | ICD-10-CM | POA: Diagnosis not present

## 2016-11-04 DIAGNOSIS — C7800 Secondary malignant neoplasm of unspecified lung: Secondary | ICD-10-CM | POA: Diagnosis not present

## 2016-11-04 DIAGNOSIS — I25810 Atherosclerosis of coronary artery bypass graft(s) without angina pectoris: Secondary | ICD-10-CM | POA: Diagnosis not present

## 2016-11-04 DIAGNOSIS — R413 Other amnesia: Secondary | ICD-10-CM | POA: Diagnosis not present

## 2016-11-04 DIAGNOSIS — I10 Essential (primary) hypertension: Secondary | ICD-10-CM | POA: Diagnosis not present

## 2016-11-04 DIAGNOSIS — E291 Testicular hypofunction: Secondary | ICD-10-CM | POA: Diagnosis not present

## 2016-11-04 DIAGNOSIS — C7931 Secondary malignant neoplasm of brain: Secondary | ICD-10-CM | POA: Diagnosis not present

## 2016-11-04 DIAGNOSIS — R2681 Unsteadiness on feet: Secondary | ICD-10-CM | POA: Diagnosis not present

## 2016-11-04 DIAGNOSIS — Z8673 Personal history of transient ischemic attack (TIA), and cerebral infarction without residual deficits: Secondary | ICD-10-CM | POA: Diagnosis not present

## 2016-11-04 DIAGNOSIS — C7951 Secondary malignant neoplasm of bone: Secondary | ICD-10-CM | POA: Diagnosis not present

## 2016-11-04 DIAGNOSIS — K039 Disease of hard tissues of teeth, unspecified: Secondary | ICD-10-CM | POA: Diagnosis not present

## 2016-11-04 DIAGNOSIS — F329 Major depressive disorder, single episode, unspecified: Secondary | ICD-10-CM | POA: Diagnosis not present

## 2016-11-04 DIAGNOSIS — C439 Malignant melanoma of skin, unspecified: Secondary | ICD-10-CM | POA: Diagnosis not present

## 2016-11-04 DIAGNOSIS — I2510 Atherosclerotic heart disease of native coronary artery without angina pectoris: Secondary | ICD-10-CM | POA: Diagnosis not present

## 2016-11-04 DIAGNOSIS — F39 Unspecified mood [affective] disorder: Secondary | ICD-10-CM | POA: Diagnosis not present

## 2016-11-04 DIAGNOSIS — N183 Chronic kidney disease, stage 3 (moderate): Secondary | ICD-10-CM | POA: Diagnosis not present

## 2016-11-04 DIAGNOSIS — R5383 Other fatigue: Secondary | ICD-10-CM | POA: Diagnosis not present

## 2016-11-04 DIAGNOSIS — C770 Secondary and unspecified malignant neoplasm of lymph nodes of head, face and neck: Secondary | ICD-10-CM | POA: Diagnosis not present

## 2016-11-04 LAB — BASIC METABOLIC PANEL
Anion gap: 10 (ref 5–15)
BUN: 49 mg/dL — AB (ref 6–20)
CHLORIDE: 105 mmol/L (ref 101–111)
CO2: 26 mmol/L (ref 22–32)
CREATININE: 1.51 mg/dL — AB (ref 0.61–1.24)
Calcium: 10.7 mg/dL — ABNORMAL HIGH (ref 8.9–10.3)
GFR calc non Af Amer: 43 mL/min — ABNORMAL LOW (ref >60)
GFR, EST AFRICAN AMERICAN: 50 mL/min — AB (ref >60)
Glucose, Bld: 103 mg/dL — ABNORMAL HIGH (ref 65–99)
Potassium: 3.9 mmol/L (ref 3.5–5.1)
SODIUM: 141 mmol/L (ref 135–145)

## 2016-11-04 MED ORDER — ENSURE ENLIVE PO LIQD: 237.0000 mL | Freq: Two times a day (BID) | ORAL | Status: DC

## 2016-11-04 NOTE — Care Management Note (Signed)
Case Management Note  Patient Details  Name: David [name] MRN: [id number] Date of Birth: 23-Oct-1940  Subjective/Objective:      hypercalcemia and acute on chronic renal failure              Action/Plan: Discharge Planning: Chart reviewed. Scheduled dc to SNF, CSW following for SNF placement.  PCP [name] MD  Expected Discharge Date:  11/04/16               Expected Discharge Plan:  Skilled Nursing Facility  In-House Referral:  Clinical Social Work  Discharge planning Services  CM Consult  Post Acute Care Choice:  NA Choice offered to:  Patient  DME Arranged:  N/A DME Agency:  NA  HH Arranged:  NA HH Agency:  NA  Status of Service:  Completed, signed off  If discussed at [employer] of Stay Meetings, dates discussed:    Additional Comments:  Erenest Rasher, RN 11/04/2016, 1:35 PM

## 2016-11-04 NOTE — Progress Notes (Signed)
Patient discharged to [facility] SNF with family, discharge instructions reviewed with patient and family who verbalized understanding. No new RX.

## 2016-11-04 NOTE — Discharge Summary (Addendum)
Physician Discharge Summary  David [name] ZSW:[id number] DOB: 06/26/1940 DOA: 11/01/2016  PCP: David Poot, MD  Admit date: 11/01/2016 Discharge date: 11/04/2016  Time spent: 35 minutes  Recommendations for Outpatient Follow-up:  1. Oncology Dr.Yan Burr Medico in 1-2weeks 2.  Please check Cmet on 7/31 and fax results to Dr.Feng's office at [phone]  3. SNF for Short term Rehab   Discharge Diagnoses:  Principal Problem:   Hypercalcemia Active Problems:   Benign essential HTN   OSA (obstructive sleep apnea)   Hemorrhagic stroke (HCC)   Hemiplegia and hemiparesis following nontraumatic intracerebral hemorrhage affecting right dominant side (HCC)   Chronic kidney disease, stage III (moderate)   Metastatic melanoma (HCC)   H/o CAD s/p CABG   Mild Cognitive dysfunction/short term memory loss  Discharge Condition: stable  Diet recommendation: heart healthy  Filed Weights   11/01/16 1815  Weight: 87 kg (191 lb 12.8 oz)    History of present illness:  Pt is a 76 yo male with hx of metastatic melanoma s/p surgical solitary brain lesion resection and chemotherapy with mets to bone and lung, HTN, hypothyroidism, who was sent from oncologist office due to hypercalcemia, with increased fatigue recently   Hospital Course:   Hypercalcemia  -due to malignancy/bone mets/chemotherapy etc  -improved with aggressive IVF and given Pamidronate x1  -Ca down to 10.7 now -repeat Cmet on 7/31, to be faxed to Dr.Feng's office   Aki on CKD 3 -improving, slowly -suspect poor PO intake and Chemo combination (dabrafenib and recent trametinib) contributing -stopped IVF now, adequately hydrated, creatinine down to 1.6 from 2 -encouraged Po intake    Widely metastatic melanoma -mets to brain,  lung, bone, lymph nodes -followed by Dr.Feng and Dr.Manning -overall prognosis appears quite poor -added ensure, Pt eval completed, SNF recommended    H/o hemorrhagic stroke and brain mets -s/p  surgery and XRT with some memory loss and cognitive issues -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 at 4mg  daily per Rad Onc, slow taper planned after FU in office -on ASA/statin    H/o CAD s/p CABG -stable, last ECHo in June showed normal EF and wall motion -continue ASA/Coreg/statin   Consultations:  Dr.Feng  Discharge Exam: Vitals:   11/03/16 2002 11/04/16 0539  BP: (!) 152/92 (!) 158/89  Pulse: 80 81  Resp: 16 14  Temp: 98 F (36.7 C) 98 F (36.7 C)    General: AAOx3, slow cognition Cardiovascular: S1S2/RRR Respiratory: CTAB  Discharge Instructions   Discharge Instructions    Diet - low sodium heart healthy    Complete by:  As directed    Increase activity slowly    Complete by:  As directed      Current Discharge Medication List    START taking these medications   Details  feeding supplement, ENSURE ENLIVE, (ENSURE ENLIVE) LIQD Take 237 mLs by mouth 2 (two) times daily between meals.      CONTINUE these medications which have NOT CHANGED   Details  aspirin EC 81 MG tablet Take 81 mg by mouth daily.    carvedilol (COREG) 6.25 MG tablet Take 6.25 mg by mouth 2 (two) times daily with a meal.    dexamethasone (DECADRON) 4 MG tablet Take 1 tablet (4 mg total) by mouth daily. Qty: 30 tablet, Refills: 2    levothyroxine (SYNTHROID, LEVOTHROID) 175 MCG tablet Take 175 mcg by mouth daily before breakfast.    pentoxifylline (TRENTAL) 400 MG CR tablet Take 1 tablet (400 mg total) by mouth 2 (two) times  daily. Qty: 60 tablet, Refills: 6    rosuvastatin (CRESTOR) 10 MG tablet Take 10 mg by mouth at bedtime.    sertraline (ZOLOFT) 50 MG tablet Take 100 mg by mouth daily.     testosterone cypionate (DEPOTESTOTERONE CYPIONATE) 100 MG/ML injection Inject 100 mg into the muscle every 14 (fourteen) days.     vitamin E (VITAMIN E) 400 UNIT capsule Take 1 capsule (400 Units total) by mouth 2 (two) times daily. Qty: 60 capsule, Refills: 6       Allergies   Allergen Reactions  . Zocor [Simvastatin] Other (See Comments)    Reaction:  Gave pt a fever     Contact information for follow-up providers    David Poot, MD Follow up in 2 week(s).   Specialty:  Internal Medicine Contact information: [address] [place] [address] [phone]        David Merle, MD. Schedule an appointment as soon as possible for a visit in 1 week(s).   Specialties:  Hematology, Oncology Contact information: [place] [address] [phone]            Contact information for after-discharge care    Destination    [facility] Follow up.   Specialty:  [place] information: [place] [phone]                   The results of significant diagnostics from this hospitalization (including imaging, microbiology, ancillary and laboratory) are listed below for reference.    Significant Diagnostic Studies: Ct Head W Wo Contrast  Result Date: 10/05/2016 CLINICAL DATA:  Melanoma. Stereotactic radiosurgery. Evaluation for radiation necrosis. EXAM: CT HEAD WITHOUT AND WITH CONTRAST TECHNIQUE: Contiguous axial images were obtained from the base of the skull through the vertex without and with intravenous contrast CONTRAST:  85mL ISOVUE-300 IOPAMIDOL (ISOVUE-300) INJECTION 61% COMPARISON:  Head CT 09/02/2016, 07/20/2016, 04/20/2016 FINDINGS: Brain: The area of hypoattenuation in the left frontal lobe white matter has slightly decreased in size. The areas of internal enhancement have also decreased. The largest continuous area of enhancement now measures 2.3 cm, previously 3.0 cm. Enhancement abutting the frontal horn of the left lateral ventricle is also slightly decreased. No remote enhancing lesions are identified. No acute intracranial hemorrhage or evidence of acute infarct. No midline shift or other significant mass effect. Vascular: No  hyperdense vessel or unexpected calcification. Skull: Remote left pterional craniotomy. Sinuses/Orbits: No sinus fluid levels or advanced mucosal thickening. No mastoid effusion. Bilateral lens replacements and left scleral banding. IMPRESSION: 1. Decreased size of the enhancing area within the left frontal lobe treatment site. Surrounding edema has also decreased. The findings support continued evolution of radiation necrosis. 2. No new enhancing lesions. Electronically Signed   By: Ulyses Jarred M.D.   On: 10/05/2016 13:47   Nm Pet Image Restage (ps) Whole Body  Result Date: 10/18/2016 CLINICAL DATA:  Subsequent treatment strategy for metastatic melanoma. EXAM: NUCLEAR MEDICINE PET WHOLE BODY TECHNIQUE: 11.0 mCi F-18 FDG was injected intravenously. Full-ring PET imaging was performed from the vertex to the feet after the radiotracer. CT data was obtained and used for attenuation correction and anatomic localization. FASTING BLOOD GLUCOSE:  Value: 101 mg/dl COMPARISON:  05/29/2016 PET-CT. FINDINGS: HEAD/NECK No hypermetabolic activity in the scalp. No hypermetabolic cervical lymph nodes. Stable left frontal encephalomalacia with overlying left frontal craniotomy. No residual hypermetabolism in the 1.5 cm right thyroid lobe nodule, which  is stable in size (series 3/image 83). CHEST No enlarged or hypermetabolic axillary, mediastinal or hilar lymph nodes. Solid 0.8 cm lingular pulmonary nodule (series 8/image 48) is non hypermetabolic (max SUV 0.6), previously 1.0 cm, slightly decreased in size. No acute consolidative airspace disease or additional significant pulmonary nodules. Stable mild cardiomegaly. Stable configuration of 3 lead left subclavian ICD with lead tips in the right atrium, right ventricular apex and coronary sinus. Left main and 3 vessel coronary atherosclerosis status post CABG. Atherosclerotic nonaneurysmal thoracic aorta. Stable top-normal caliber pulmonary arteries. No pneumothorax. No  pleural effusions. Intact sternotomy wires. ABDOMEN/PELVIS Marked diffuse colonic diverticulosis. There is new focal hypermetabolism (max SUV 10.0) localizing to the proximal sigmoid colon, with associated focal colonic wall thickening and mild pericolonic fat stranding at the site of a proximal sigmoid diverticulum (series 3/image 191), most suggestive of mild acute sigmoid diverticulitis. No abnormal hypermetabolic activity within the liver, pancreas, adrenal glands, or spleen. No hypermetabolic lymph nodes in the abdomen or pelvis. Simple 2.3 cm interpolar right renal cyst. Atherosclerotic abdominal aorta with stable 3.1 cm infrarenal abdominal aortic aneurysm. SKELETON No focal hypermetabolic activity to suggest skeletal metastasis. EXTREMITIES Stable mild degenerative uptake in the region of the right first metatarsophalangeal joint. No abnormal hypermetabolic activity in the lower extremities. IMPRESSION: 1. New focal hypermetabolism in the proximal sigmoid colon with associated focal colonic wall thickening and mild pericolonic fat stranding at the site of a proximal sigmoid diverticulum, favored to represent mild acute sigmoid diverticulitis. Metastatic disease is considered less likely. Clinical correlation is necessary. Consider appropriate antibiotic therapy, as clinically warranted. Attention to this region recommended on future PET-CT follow-up. 2. Otherwise complete metabolic response with no evidence of hypermetabolic metastatic disease. Lingular pulmonary nodule is slightly decreased in size and demonstrates no significant metabolism. 3. No residual hypermetabolism in the right thyroid lobe nodule, which is stable in size. 4. Aortic Atherosclerosis (ICD10-I70.0). Stable 3.1 cm infrarenal abdominal aortic aneurysm. Aortic aneurysm NOS (ICD10-I71.9). Recommend followup by ultrasound in 3 years. This recommendation follows ACR consensus guidelines: White Paper of the ACR Incidental Findings Committee II  on Vascular Findings. J Am Coll Radiol 2013; 10:789-794. These results will be called to the ordering clinician or representative by the Radiologist Assistant, and communication documented in the PACS or zVision Dashboard. Electronically Signed   By: Ilona Sorrel M.D.   On: 10/18/2016 17:42    Microbiology: No results found for this or any previous visit (from the past 240 hour(s)).   Labs: Basic Metabolic Panel:  Recent Labs Lab 11/01/16 1544 11/02/16 0357 11/03/16 0337 11/04/16 0723  NA 143 142 142 141  K 4.5 3.8 3.8 3.9  CL  --  108 110 105  CO2 31* 26 26 26   GLUCOSE 90 91 109* 103*  BUN 54.1* 48* 47* 49*  CREATININE 2.0* 1.69* 1.96* 1.51*  CALCIUM 13.4* 11.1* 10.5* 10.7*  MG 2.2  --   --   --    Liver Function Tests:  Recent Labs Lab 11/01/16 1544 11/02/16 0357 11/03/16 0337  AST 17 14* 16  ALT 15 13* 13*  ALKPHOS 62 41 42  BILITOT 0.77 0.7 0.7  PROT 7.1 5.7* 5.7*  ALBUMIN 3.6 3.1* 3.1*   No results for input(s): LIPASE, AMYLASE in the last 168 hours. No results for input(s): AMMONIA in the last 168 hours. CBC:  Recent Labs Lab 11/01/16 1544 11/02/16 0357  WBC 7.0 6.3  NEUTROABS 5.3  --   HGB 15.6 13.4  HCT 45.9 39.0  MCV 95.2 91.8  PLT 186 167   Cardiac Enzymes: No results for input(s): CKTOTAL, CKMB, CKMBINDEX, TROPONINI in the last 168 hours. BNP: BNP (last 3 results) No results for input(s): BNP in the last 8760 hours.  ProBNP (last 3 results) No results for input(s): PROBNP in the last 8760 hours.  CBG: No results for input(s): GLUCAP in the last 168 hours.     SignedDomenic Polite MD.  [facility] 11/04/2016, 9:41 AM

## 2016-11-04 NOTE — Clinical Social Work Placement (Signed)
   CLINICAL SOCIAL WORK PLACEMENT  NOTE  Date:  11/04/2016  Patient Details  Name: David [name] MRN: [id number] Date of Birth: Mar 31, 1941  Clinical Social Work is seeking post-discharge placement for this patient at the [place] level of care (*CSW will initial, date and re-position this form in  chart as items are completed):  Yes   Patient/family provided with [facility] Work Department's list of facilities offering this level of care within the geographic area requested by the patient (or if unable, by the patient's family).  Yes   Patient/family informed of their freedom to choose among providers that offer the needed level of care, that participate in Medicare, Medicaid or managed care program needed by the patient, have an available bed and are willing to accept the patient.  Yes   Patient/family informed of [facility]'s ownership interest in [facility] and [facility], as well as of the fact that they are under no obligation to receive care at these facilities.  PASRR submitted to EDS on       PASRR number received on       Existing PASRR number confirmed on 11/04/16     FL2 transmitted to all facilities in geographic area requested by pt/family on 11/04/16     FL2 transmitted to all facilities within larger geographic area on       Patient informed that his/her managed care company has contracts with or will negotiate with certain facilities, including the following:        Yes   Patient/family informed of bed offers received.  Patient chooses bed at [facility]     Physician recommends and patient chooses bed at [facility]    Patient to be transferred to [facility] on 11/04/16.  Patient to be transferred to facility by EMS     Patient family notified on 11/04/16 of transfer.  Name of family member notified:        PHYSICIAN Please sign FL2     Additional Comment:     _______________________________________________ Lilly Cove, LCSW 11/04/2016, 10:04 AM

## 2016-11-04 NOTE — Care Management Important Message (Signed)
Important Message  Patient Details  Name: David [name] MRN: [id number] Date of Birth: Jan 08, 1941   Medicare Important Message Given:  Yes    Erenest Rasher, RN 11/04/2016, 1:32 PM

## 2016-11-04 NOTE — Evaluation (Signed)
Occupational Therapy Evaluation Patient Details Name: David [name] MRN: [id number] DOB: 29-Aug-1940 Today's Date: 11/04/2016    History of Present Illness Pt is a 76 yo male with hx of metastatic melanoma s/p surgical solitary brain lesion resection and chemotherapy with mets to bone and lung, HTN, hypothyroidism, who was sent from oncologist office today due to hypercalcemia. Pt said he has been feeling fatigue and tired    Clinical Impression   Pt was admitted for the above.  He was mostly independent prior to admission but had assistance from sister as needed. Per chart, his wife has dementia. Pt needs min guard overall but fatiques quickly. Goals are for supervision level in acute    Follow Up Recommendations  SNF    Equipment Recommendations   (to be further assessed)    Recommendations for Other Services       Precautions / Restrictions Precautions Precautions: Fall Restrictions Weight Bearing Restrictions: No      Mobility Bed Mobility           Sit to supine: Supervision   General bed mobility comments: extra time  Transfers   Equipment used: Rolling walker (2 wheeled)   Sit to Stand: Min guard         General transfer comment: for safety    Balance                                           ADL either performed or assessed with clinical judgement   ADL Overall ADL's : Needs assistance/impaired Eating/Feeding: Independent   Grooming: Set up;Sitting   Upper Body Bathing: Set up;Sitting   Lower Body Bathing: Min guard;Sit to/from stand   Upper Body Dressing : Set up;Sitting   Lower Body Dressing: Min guard;Sit to/from stand   Toilet Transfer: Min guard;Ambulation;RW   Toileting- Water quality scientist and Hygiene: Min guard;Sit to/from stand         General ADL Comments: pt was up in chair and fatiqued.  Ambulated around bed to return to supine     Vision         Perception     Praxis      Pertinent  Vitals/Pain Pain Assessment: No/denies pain     Hand Dominance     Extremity/Trunk Assessment Upper Extremity Assessment Upper Extremity Assessment: Overall WFL for tasks assessed           Communication Communication Communication: No difficulties   Cognition Arousal/Alertness: Awake/alert Behavior During Therapy: WFL for tasks assessed/performed Overall Cognitive Status:  (decreased memory per chart; WFLs during session)                                     General Comments       Exercises     Shoulder Instructions      Home Living Family/patient expects to be discharged to:: Skilled nursing facility Living Arrangements: Spouse/significant other;Other relatives                               Additional Comments: resides at [facility] modular home, sister lives close by and helps out. wife has dementia.      Prior Functioning/Environment Level of Independence: Independent  OT Problem List: Decreased strength;Decreased activity tolerance;Decreased knowledge of use of DME or AE;Impaired balance (sitting and/or standing)      OT Treatment/Interventions: Self-care/ADL training;DME and/or AE instruction;Patient/family education;Balance training;energy conservation    OT Goals(Current goals can be found in the care plan section) Acute Rehab OT Goals Patient Stated Goal: wants to walk and get better. OT Goal Formulation: With patient Time For Goal Achievement: 11/11/16 Potential to Achieve Goals: Good ADL Goals Pt Will Transfer to Toilet: with supervision;ambulating;bedside commode;regular height toilet (vs) Additional ADL Goal #1: pt will gather clothes at supervision level and complete adl this level  OT Frequency: Min 2X/week   Barriers to D/C:            Co-evaluation              AM-PAC PT "6 Clicks" Daily Activity     Outcome Measure Help from another person eating meals?: None Help from another  person taking care of personal grooming?: A Little Help from another person toileting, which includes using toliet, bedpan, or urinal?: A Little Help from another person bathing (including washing, rinsing, drying)?: A Little Help from another person to put on and taking off regular upper body clothing?: A Little Help from another person to put on and taking off regular lower body clothing?: A Little 6 Click Score: 19   End of Session    Activity Tolerance: Patient limited by fatigue Patient left: in bed;with call bell/phone within reach;with bed alarm set  OT Visit Diagnosis: Unsteadiness on feet (R26.81);Muscle weakness (generalized) (M62.81)                Time: 6144-3154 OT Time Calculation (min): 12 min Charges:  OT General Charges $OT Visit: 1 Procedure OT Evaluation $OT Eval Low Complexity: 1 Procedure G-Codes:     David [name], OTR/L [phone] 11/04/2016  David [name] 11/04/2016, 10:25 AM

## 2016-11-04 NOTE — Progress Notes (Addendum)
LCSW following to complete discharge to SNF  Patient medically stable will DC to [facility] Discussed with Seth Bake who confirms plan:     Patient going to room:  Patient will got to room 65, daughter notified.  Nurse call report to: [phone]   LCSW has sent all paperwork by the [facility] and also by fax per request of RN on unit.  [phone]. Patient will transport by sister:  David [name].  Spoke with [name] (daughter who reports she is out of town) [phone] and Windy Carina will be here after 1pm.  Will update RN and daughter.  No other needs, will sign off.  Lane Hacker, MSW Clinical Social Work: Printmaker Coverage for :  [phone]

## 2016-11-06 ENCOUNTER — Telehealth: Payer: Self-pay | Admitting: Hematology

## 2016-11-06 NOTE — Telephone Encounter (Signed)
sw pt wife to inform of 8/3 appt at 115 per sch msg

## 2016-11-07 ENCOUNTER — Telehealth: Payer: Self-pay

## 2016-11-07 DIAGNOSIS — F39 Unspecified mood [affective] disorder: Secondary | ICD-10-CM

## 2016-11-07 DIAGNOSIS — C7951 Secondary malignant neoplasm of bone: Secondary | ICD-10-CM | POA: Diagnosis not present

## 2016-11-07 DIAGNOSIS — C4370 Malignant melanoma of unspecified lower limb, including hip: Secondary | ICD-10-CM | POA: Diagnosis not present

## 2016-11-07 DIAGNOSIS — K039 Disease of hard tissues of teeth, unspecified: Secondary | ICD-10-CM

## 2016-11-07 DIAGNOSIS — I2510 Atherosclerotic heart disease of native coronary artery without angina pectoris: Secondary | ICD-10-CM | POA: Diagnosis not present

## 2016-11-07 NOTE — Telephone Encounter (Signed)
Pt called to clarify he has stopped his chemo pills. Also to clarify next appt. Instructed him to write it down.

## 2016-11-08 ENCOUNTER — Other Ambulatory Visit: Payer: Self-pay | Admitting: Hematology

## 2016-11-09 ENCOUNTER — Other Ambulatory Visit: Payer: Medicare Other

## 2016-11-09 ENCOUNTER — Ambulatory Visit: Payer: Medicare Other | Admitting: Hematology

## 2016-11-09 NOTE — Progress Notes (Signed)
[place]  [phone] [phone]) [phone]  Clinic Follow up Note   Patient Care Team: [name], MD as PCP - General (Internal Medicine) 11/10/2016  Chief complaint: Follow-up metastatic melanoma  Oncology History   Metastatic melanoma [facility])   Staging form: Melanoma of the Skin, AJCC 7th Edition     Clinical stage from 09/21/2015: Stage IV [place], [place], [place]) - Signed by [name], MD on 10/09/2015       Metastatic melanoma ([place])   09/16/2015 Imaging    PET scan showed a hypermetabolic 2.4OX lingular nodule, and a 32m right upper lobe lung nodule, left subclavicular nodes, and bone metastasis in the proximal right humerus and right femur.       09/21/2015 Initial Diagnosis    [place]astatic [place]      09/21/2015 Initial Biopsy    Left subclavicular lymph node biopsy showed prostatic of melanoma      09/21/2015 Miscellaneous    BRAF V600K mutation (+)       10/04/2015 Imaging    CT head with without contrast showed a 2.0 x 1.6 x 1.7 cm superficial left frontal hyperdense lesion, with postcontrast enhancement, lying within the previous hemorrhagic area, concerning for solitary melanoma metastasis      10/06/2015 - 09/06/2016 Chemotherapy    Nivolumab 2477mevery 2 weeks      10/13/2015 - 10/13/2015 Radiation Therapy    10/13/2015 Preop SRS treatment: 14 Gy  to the left frontal lesion in 1 fraction      10/15/2015 Surgery    left front craniotomy and tumor excision       10/15/2015 Pathology Results    left frontal brain mass excision showed metastatic melanoma, 1.5X1.5X0.6cm      01/13/2016 Imaging    CT head without and with contrast showed ill-defined enhancement and dural thickening of the left frontal resection cavity may represent a post treatment changes or residual neoplasm. A new 6 mm hypodensity in the right cerebellar hemisphere with uncertainty enhancement may represent a metastasis or small brain parenchymal hemorrhage.       01/27/2016 - 01/27/2016 Radiation Therapy    01/27/16 SRS Treatment:  20 Gy in 1 fraction to a 6 mm right cerebellar brain met       03/29/2016 PET scan    PET 03/29/2016 IMPRESSION: 1. Overall there has been an interval response to therapy. The index lesions sided on previous exam it are less hypermetabolic when compared with the previous exam. 2. There is an intramuscular lesion within the right masseter which has increased FDG uptake compared with previous exam. 3. No new sites of disease identified. 4. Aortic atherosclerosis and infrarenal abdominal aortic aneurysm. Recommend followup by ultrasound in 3 years. This recommendation follows ACR consensus guidelines: White Paper of the ACR Incidental Findings Committee II on Vascular Findings. J Joellyn Ruedadiol 2013; 10:789-794      04/20/2016 Imaging    CT Head w wo contrast 1. Mild patchy enhancement at the left anterior frontal lobe treatment site (series 11, image 39) without mass effect and stable surrounding white matter hypodensity without strong evidence of acute vasogenic edema. Recommend continued CT surveillance. 2. No new metastatic disease or new intracranial abnormality identified. 3. Probable benign 14 mm left parietal bone lucent lesion, unchanged since May 2017.      05/06/2016 - 05/08/2016 Hospital Admission    Pt was admitted for rectal bleeding for one day. Breathing spontaneously resolved, hemoglobin dropped from 15 to 10.5, did not  require blood transfusion. Colonoscopy showed no active bleeding, except diverticulosis and hemorrhoid. He was discharged to home.      05/08/2016 Procedure    Colonoscopy  - Stool in the ascending colon. Fluid aspiration performed. - Diverticulosis in the entire examined colon. - Non-bleeding internal hemorrhoids. - The examination was otherwise normal on direct and retroflexion views.      05/29/2016 Imaging    PET Image Restaging IMPRESSION: 1. Stable exam with no evidence  progression. 2. Photopenia in the LEFT frontal lobe at prior surgical site. 3. Stable mildly hypermetabolic RIGHT paratracheal node. This may represent a thyroid nodule. 4. Stable size of minimally metabolic lingular pulmonary nodule. 5. Stable skeletal lesion of the RIGHT iliac crest 6. No cutaneous metabolic lesion.      07/20/2016 Imaging    CT Head W WO Contrast IMPRESSION: Pronounced change in the left frontal region. Much more regional vasogenic edema. Relatively stable appearance of the abnormal enhancement extending from the lateral margin of the frontal horn of left lateral ventricle to the frontal brain surface. New region of abnormal enhancement surrounding the frontal horn of the left lateral ventricle measuring approximately 3.2 cm. The differential diagnosis is recurrent tumor versus radiation necrosis. The patient is reportedly not an MRI candidate. This area is large enough that CT perfusion could possibly make a reasonable differentiation.      09/02/2016 Imaging    CT Head 09/02/16 IMPRESSION: Somewhat mixed pattern of slight posterior progression of pleomorphic enhancement surrounding the LEFT lateral ventricle although no significant mass effect, and reduced vasogenic edema compared with the scan in April.       Chemotherapy    Start dabrafenib and trametinib 09/27/16       10/05/2016 Imaging    CT Head w & w/o contrast  IMPRESSION: 1. Decreased size of the enhancing area within the left frontal lobe treatment site. Surrounding edema has also decreased. The findings support continued evolution of radiation necrosis. 2. No new enhancing lesions.      10/18/2016 PET scan    IMPRESSION: 1. New focal hypermetabolism in the proximal sigmoid colon with associated focal colonic wall thickening and mild pericolonic fat stranding at the site of a proximal sigmoid diverticulum, favored to represent mild acute sigmoid diverticulitis. Metastatic disease  is considered less likely. Clinical correlation is necessary. Consider appropriate antibiotic therapy, as clinically warranted. Attention to this region recommended on future PET-CT follow-up. 2. Otherwise complete metabolic response with no evidence of hypermetabolic metastatic disease. Lingular pulmonary nodule is slightly decreased in size and demonstrates no significant metabolism. 3. No residual hypermetabolism in the right thyroid lobe nodule, which is stable in size. 4. Aortic Atherosclerosis (ICD10-I70.0). Stable 3.1 cm infrarenal abdominal aortic aneurysm. Aortic aneurysm NOS (ICD10-I71.9). Recommend followup by ultrasound in 3 years. This recommendation follows ACR consensus guidelines: White Paper of the ACR Incidental Findings Committee II on Vascular Findings. Joellyn Rued Radiol 2013; 16:109-604.      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 He was found to have left frontal hemorrhage 5.7 cm x 4.5 cm with 6 mm left to right shift.During the workup for his stroke, a left upper lung mass was noticed on the CT scan. I was  called to evaluate his lung mass.  Patient has aphasia, not able to communicate much. According to his daughter, Dr. [name], who is a OB GYN at [employer], he probably only smoked for very short period time during the college. He did have 3 skin melanoma, which was removed, last one was 2-3 years ago. No other cancer history.  CURRENT THERAPY:  Supportive care and dexamethasone   INTERVAL HISTORY:  Mr. Cortopassi returns for follow-up accompanied by his family. He was admitted to the hospital on 7/25 due to fatigue and hypercalcemia. He received IV fluids and he is feeling much better. He does  some Pt and OT while in the nursing home. He is scheduled to go home next week. He is able to move around independently with his walker.  Per his daughter, she thinks he feels a lot better compared to last week. A year from now, if he does not resolve thinks about stopping all his treatments. She also says that the PTs have noticed right sided weakness but is still able to use it while writing and eating.   REVIEW OF SYSTEMS:  Constitutional: Denies fevers, chills or abnormal weight loss, (+) fatigue (+) confusion  Eyes: Denies blurriness of vision Ears, nose, mouth, throat, and face: Denies mucositis or sore throat Respiratory: Denies cough, dyspnea or wheezes Cardiovascular: Denies palpitation, chest discomfort or lower extremity swelling Gastrointestinal:  Denies nausea, heartburn, hematochezia  Skin: (+) mild rash on bilateral upper extremities which is mildly tender Lymphatics: Denies new lymphadenopathy or easy bruising Neurological:see HPI  Behavioral/Psych: Mood is stable, no new changes  All other systems were reviewed with the patient and are negative.  MEDICAL HISTORY:  Past Medical History:  Diagnosis Date  . Anginal pain ([place])   . Arthritis    mostly hands  . Benign familial hematuria   . BPH (benign prostatic hypertrophy)   . Brain cancer ([place])    melanoma with brain met  . Coronary artery disease   . GERD (gastroesophageal reflux disease)   . High cholesterol   . Hypertension   . Hypothyroidism   . Intracerebral hemorrhage ([place])   . Melanoma ([place])   . Metastatic melanoma to lung ([place])   . Mood disorder ([place])   . Myocardial infarction ([place])   . Pacemaker    due to syncope, 3rd degree HB (upgrade to [facility]. Jude CRT-P 02/25/13 (Dr. Uvaldo Rising)  . Rectal bleed    due to NSAIDS  . Renal disorder   . Skin cancer    melanoma  . Sleep apnea    uses CPAP  . Stroke [facility])     SURGICAL HISTORY: Past Surgical History:  Procedure Laterality Date  . APPLICATION OF CRANIAL  NAVIGATION N/A 10/15/2015   Procedure: APPLICATION OF CRANIAL NAVIGATION;  Surgeon: Erline Levine, MD;  Location: [place] NEURO ORS;  Service: Neurosurgery;  Laterality: N/A;  . CARDIAC CATHETERIZATION  2013  . CARDIAC SURGERY     bypass X 2  . COLONOSCOPY WITH PROPOFOL N/A 05/08/2016   Procedure: COLONOSCOPY WITH PROPOFOL;  Surgeon: Jonathon Bellows, MD;  Location: [facility] ENDOSCOPY;  Service: Gastroenterology;  Laterality: N/A;  . CORONARY ARTERY BYPASS GRAFT  2013   LIMA-LAD, SVG-PDA 03/27/11 (Dr. Francee Gentile)  . CRANIOTOMY N/A 10/15/2015   Procedure: LEFT FRONTAL CRANIOTOMY TUMOR EXCISION with Curve;  Surgeon: Erline Levine, MD;  Location: [place] NEURO ORS;  Service: Neurosurgery;  Laterality: N/A;  CRANIOTOMY TUMOR EXCISION  . EYE SURGERY    . FOOT SURGERY  08/2010  .  JOINT REPLACEMENT Left    partial knee  . KNEE ARTHROSCOPY  12/2008  . LYMPH NODE BIOPSY    . PACEMAKER INSERTION    . TONSILLECTOMY      I have reviewed the social history and family history with the patient and they are unchanged from previous note.  ALLERGIES:  is allergic to zocor [simvastatin].  MEDICATIONS:  Current Outpatient Prescriptions  Medication Sig Dispense Refill  . aspirin EC 81 MG tablet Take 81 mg by mouth daily.    . carvedilol (COREG) 6.25 MG tablet Take 6.25 mg by mouth 2 (two) times daily with a meal.    . dexamethasone (DECADRON) 4 MG tablet Take 1 tablet (4 mg total) by mouth daily. 30 tablet 2  . feeding supplement, ENSURE ENLIVE, (ENSURE ENLIVE) LIQD Take 237 mLs by mouth 2 (two) times daily between meals.    Marland Kitchen levothyroxine (SYNTHROID, LEVOTHROID) 175 MCG tablet Take 175 mcg by mouth daily before breakfast.    . pentoxifylline (TRENTAL) 400 MG CR tablet Take 1 tablet (400 mg total) by mouth 2 (two) times daily. 60 tablet 6  . rosuvastatin (CRESTOR) 10 MG tablet Take 10 mg by mouth at bedtime.    . sertraline (ZOLOFT) 50 MG tablet Take 100 mg by mouth daily.     Marland Kitchen testosterone cypionate (DEPOTESTOTERONE CYPIONATE)  100 MG/ML injection Inject 100 mg into the muscle every 14 (fourteen) days.     . vitamin E (VITAMIN E) 400 UNIT capsule Take 1 capsule (400 Units total) by mouth 2 (two) times daily. 60 capsule 6   No current facility-administered medications for this visit.    Facility-Administered Medications Ordered in Other Visits  Medication Dose Route Frequency Provider Last Rate Last Dose  . 0.9 %  sodium chloride infusion  1,000 mL Intravenous Once [name], MD        PHYSICAL EXAMINATION:  ECOG PERFORMANCE STATUS: 1 BP (!) 134/96 (BP Location: Left Arm, Patient Position: Sitting) Comment: nurse aware  Pulse 75   Resp 18   Ht 5' 9.5" (1.765 m)   Wt 203 lb 8 oz (92.3 kg)   SpO2 97%   BMI 29.62 kg/m  GENERAL:alert, no distress and comfortable SKIN: skin color, texture, turgor are normal, (+) diffuse papular skin rash on bilateral forearms and hand, improved EYES: normal, Conjunctiva are pink and non-injected, sclera clear OROPHARYNX:no exudate, no erythema and lips, buccal mucosa, and tongue normal  NECK: supple, thyroid normal size, non-tender, without nodularity LYMPH:  no palpable lymphadenopathy in the cervical, [place], axillary or inguinal LUNGS: clear to auscultation and percussion with normal breathing effort HEART: regular rate & rhythm and no murmurs and no lower extremity edema ABDOMEN:abdomen soft, non-tender and normal bowel sounds Musculoskeletal:no cyanosis of digits and no clubbing (+)weaker right arm  NEURO: alert & oriented x 3 with fluent speech, no focal motor/sensory deficits, he walks independently, gait is normal   LABORATORY DATA:  I have reviewed the data as listed CBC Latest Ref Rng & Units 11/10/2016 11/02/2016 11/01/2016  WBC 4.0 - 10.3 10e3/uL 9.5 6.3 7.0  Hemoglobin 13.0 - 17.1 g/dL 14.9 13.4 15.6  Hematocrit 38.4 - 49.9 % 42.9 39.0 45.9  Platelets 140 - 400 10e3/uL 197 167 186     CMP Latest Ref Rng & Units 11/10/2016 11/04/2016 11/03/2016  Glucose 70 - 140 mg/dl  88 103(H) 109(H)  BUN 7.0 - 26.0 mg/dL 40.1(H) 49(H) 47(H)  Creatinine 0.7 - 1.3 mg/dL 1.2 1.51(H) 1.96(H)  Sodium 136 -  145 mEq/L 136 141 142  Potassium 3.5 - 5.1 mEq/L 4.4 3.9 3.8  Chloride 101 - 111 mmol/L - 105 110  CO2 22 - 29 mEq/L 23 26 26   Calcium 8.4 - 10.4 mg/dL 9.1 10.7(H) 10.5(H)  Total Protein 6.4 - 8.3 g/dL 6.4 - 5.7(L)  Total Bilirubin 0.20 - 1.20 mg/dL 0.43 - 0.7  Alkaline Phos 40 - 150 U/L 58 - 42  AST 5 - 34 U/L 27 - 16  ALT 0 - 55 U/L 43 - 13(L)   PATHOLOGY REPORT: Diagnosis 09/21/2015 Lymph node, needle/core biopsy, hypermetabolic left sub clavicular LN - METASTATIC MALIGNANT MELANOMA. - SEE COMMENT.  Diagnosis 10/15/2015 Brain, for tumor resection, Left frontal - METASTATIC MELANOMA.     PROCEDURES: Colonoscopy 05/08/16 - Stool in the ascending colon. Fluid aspiration performed. - Diverticulosis in the entire examined colon. - Non-bleeding internal hemorrhoids. - The examination was otherwise normal on direct and retroflexion views.  RADIOGRAPHIC STUDIES: I have personally reviewed the radiological images as listed and agreed with the findings in the report. No results found.   PET 10/18/16 IMPRESSION: 1. New focal hypermetabolism in the proximal sigmoid colon with associated focal colonic wall thickening and mild pericolonic fat stranding at the site of a proximal sigmoid diverticulum, favored to represent mild acute sigmoid diverticulitis. Metastatic disease is considered less likely. Clinical correlation is necessary. Consider appropriate antibiotic therapy, as clinically warranted. Attention to this region recommended on future PET-CT follow-up. 2. Otherwise complete metabolic response with no evidence of hypermetabolic metastatic disease. Lingular pulmonary nodule is slightly decreased in size and demonstrates no significant metabolism. 3. No residual hypermetabolism in the right thyroid lobe nodule, which is stable in size. 4. Aortic  Atherosclerosis (ICD10-I70.0). Stable 3.1 cm infrarenal abdominal aortic aneurysm. Aortic aneurysm NOS (ICD10-I71.9). Recommend followup by ultrasound in 3 years. This recommendation follows ACR consensus guidelines: White Paper of the ACR Incidental Findings Committee II on Vascular Findings. J Am Coll Radiol 2013;   CT Head W and WO Contrast 10/05/16 IMPRESSION: 1. Decreased size of the enhancing area within the left frontal lobe treatment site. Surrounding edema has also decreased. The findings support continued evolution of radiation necrosis. 2. No new enhancing lesions.  CT Head 09/02/16 IMPRESSION: Somewhat mixed pattern of slight posterior progression of pleomorphic enhancement surrounding the LEFT lateral ventricle although no significant mass effect, and reduced vasogenic edema compared with the scan in April.  CT Head W WO Contrast July 29, 2016 IMPRESSION: Pronounced change in the left frontal region. Much more regional vasogenic edema. Relatively stable appearance of the abnormal enhancement extending from the lateral margin of the frontal horn of left lateral ventricle to the frontal brain surface. New region of abnormal enhancement surrounding the frontal horn of the left lateral ventricle measuring approximately 3.2 cm. The differential diagnosis is recurrent tumor versus radiation necrosis. The patient is reportedly not an MRI candidate. This area is large enough that CT perfusion could possibly make a reasonable differentiation.  PET Image Restage 05/29/16 IMPRESSION: 1. Stable exam with no evidence progression. 2. Photopenia in the LEFT frontal lobe at prior surgical site. 3. Stable mildly hypermetabolic RIGHT paratracheal node. This may represent a thyroid nodule. 4. Stable size of minimally metabolic lingular pulmonary nodule. 5. Stable skeletal lesion of the RIGHT iliac crest 6. No cutaneous metabolic lesion.   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   1. Hypercalcemia  and AKI  -he was found to have calcium 13.4 today and Cr 2.0 on his office visit 11/01/2016 -Possibly related to his underlying malignancy (bone mets) -He was on Xgeva monthly, last dose on 08/10/2016, missed dose last month, which probably contributes to his hypercalcemia too, will restart today  -Patient recieved IV fluids and pamidronate in the hospital -resolved now   2. Metastatic melanoma to lung, node, bone and brain, BRAF V600K mutation (+) -I previously  reviewed his PET/CT scan images with pt and his family members. The scan revealed hypermetabolic left upper lobe lung mass, 2 small right lung nodules, probable bone metastasis, and a subcutaneous left supraclavicular lesion, which is not palpable  -His repeat CT had showed a 2.0 cm left frontal hyperdense lesion with enhancement on contrast, in the area of his recent hemorrhagic stroke, highly suspicious for brain metastasis.  -We previously discussed his left supraclavicular node biopsy results, which showed metastatic melanoma.. -His case was discussed in our sarcoma tumor board. -I previously recommend a first-line treatment with Nivolumab alone, given his recent stroke, low tumor burden. I'll reserve BRAF inhibitor with or without MEK inhibitor as a second line therapy if he progress on immunotherapy. Other options of immunotherapy, especially checkpoint inhibitor with ipilumimab, were also discussed with pt and his daughter -He has been tolerating Nivolumab well, no significant side effects except fatigue. It has been held [place] due to his chronic steroids use -He has had near complete metabolic response on the recent restaging PET scan in July 2018 -We'll continue Delton See monthly for his bone metastasis. 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 Hemorrhagic stroke and brain metastasis  -I encouraged him to continue physical therapy and occupational therapy. 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 However his clinical neurological function has deteriorated -I have discussed with our neuro-oncologist Dr. VMickeal Skinner and both of uKoreafeel he is not a candidate for Avastin for his radionecrosis due to the risk of bleeding  -repeat brain CT scan before next visit in 2 weeks  -I'll gradually and slowly taper off his dexamethasone.  4. 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 VMickeal Skinnerto discuss treatment options to improve his neurologic function, patient is very frustrated about this, which significantly impacting his quality of life. -continue PT/OT  -repeat CT brain in 2 weeks   5. Fatigue, hypothyroidism and hypotestosteronemia -He has baseline hypothyroidism and hypotestosteronemia -His fatigue has much improved since he restarted testosterone injection  by his primary care physician -He will follow-up with  his primary care physician Dr. Asher Muir for his Synthroid dose adjustment  -His TSH was slightly In November 2017. TSH 9.6 on 06/15/16. - TSH 25.01 on 08/10/16. -The patient will continue with light exercise as he is able. -I again encouraged him to be active  5. Anemia and history of GI bleeding  -his anemia has resolved now -I previously encouraged him to continue oral iron supplement for a total of 3 months   6. HTN -he is on amlodipine and carvedilol, follow-up of his primary care physician -Due to drug interaction with new oral chemo he iwll ned to monitor his BP more often.   7. Depression  -he is on zoloft 39m daily, management by his PCP  -improved overall   8. CKD stage III -His Cr has been slightly elevated -I previously encouraged him to drink water adequately -avoid NSAIDs  9. Goal of care discussion  -We previously discussed the incurable nature of his metastatic melanoma, and the overall poor prognosis, especially if he does not have good response to chemotherapy or progress on chemo -The patient understands the goal of care is palliative. -I recommend DNR/DNI, he will think about it   10. CAD -history of 2 double vessel bypass -He has pPsychologist, forensic-per pt (her daughter) request, will switch his cardiologist to Dr. BHaroldine Laws  Plan - XDelton Seeinjection today and continue monthly  - brain CT before next visit - f/u in 2 weeks - set up appointment with Dr. VMickeal Skinner- reduce dexamethasone from 421mdaily to [id number] until next visit  -hold on systemic therapy for his metastatic melanoma for now   All questions were answered. The patient knows to call the clinic with any problems, questions or concerns. No barriers to learning was detected.  I spent 30 minutes counseling the patient face to face. The total time spent in the appointment was 40 minutes and more than 50% was on counseling and review of test results.  This  document serves as a record of services personally performed by YanTruitt MerleD. It was created on her behalf by TalBrandt Loosen trained medical scribe. The creation of this record is based on the scribe's personal observations and the provider's statements to them. This document has been checked and approved by the attending provider.

## 2016-11-10 ENCOUNTER — Other Ambulatory Visit (HOSPITAL_BASED_OUTPATIENT_CLINIC_OR_DEPARTMENT_OTHER): Payer: Medicare Other

## 2016-11-10 ENCOUNTER — Inpatient Hospital Stay (HOSPITAL_BASED_OUTPATIENT_CLINIC_OR_DEPARTMENT_OTHER): Payer: Medicare Other

## 2016-11-10 ENCOUNTER — Ambulatory Visit (HOSPITAL_BASED_OUTPATIENT_CLINIC_OR_DEPARTMENT_OTHER): Payer: Medicare Other | Admitting: Hematology

## 2016-11-10 ENCOUNTER — Encounter: Payer: Self-pay | Admitting: Hematology

## 2016-11-10 VITALS — BP 134/96 | HR 75 | Resp 18 | Ht 69.5 in | Wt 203.5 lb

## 2016-11-10 DIAGNOSIS — C799 Secondary malignant neoplasm of unspecified site: Secondary | ICD-10-CM

## 2016-11-10 DIAGNOSIS — C7931 Secondary malignant neoplasm of brain: Secondary | ICD-10-CM

## 2016-11-10 DIAGNOSIS — I25810 Atherosclerosis of coronary artery bypass graft(s) without angina pectoris: Secondary | ICD-10-CM

## 2016-11-10 DIAGNOSIS — C7800 Secondary malignant neoplasm of unspecified lung: Secondary | ICD-10-CM

## 2016-11-10 DIAGNOSIS — E291 Testicular hypofunction: Secondary | ICD-10-CM

## 2016-11-10 DIAGNOSIS — C439 Malignant melanoma of skin, unspecified: Secondary | ICD-10-CM | POA: Diagnosis not present

## 2016-11-10 DIAGNOSIS — N183 Chronic kidney disease, stage 3 (moderate): Secondary | ICD-10-CM

## 2016-11-10 DIAGNOSIS — I10 Essential (primary) hypertension: Secondary | ICD-10-CM

## 2016-11-10 DIAGNOSIS — R413 Other amnesia: Secondary | ICD-10-CM | POA: Diagnosis not present

## 2016-11-10 DIAGNOSIS — C770 Secondary and unspecified malignant neoplasm of lymph nodes of head, face and neck: Secondary | ICD-10-CM

## 2016-11-10 DIAGNOSIS — F329 Major depressive disorder, single episode, unspecified: Secondary | ICD-10-CM

## 2016-11-10 DIAGNOSIS — R5383 Other fatigue: Secondary | ICD-10-CM

## 2016-11-10 DIAGNOSIS — C7951 Secondary malignant neoplasm of bone: Secondary | ICD-10-CM | POA: Diagnosis not present

## 2016-11-10 DIAGNOSIS — I69320 Aphasia following cerebral infarction: Secondary | ICD-10-CM | POA: Diagnosis not present

## 2016-11-10 DIAGNOSIS — Z8673 Personal history of transient ischemic attack (TIA), and cerebral infarction without residual deficits: Secondary | ICD-10-CM | POA: Diagnosis not present

## 2016-11-10 DIAGNOSIS — E039 Hypothyroidism, unspecified: Secondary | ICD-10-CM | POA: Diagnosis not present

## 2016-11-10 LAB — COMPREHENSIVE METABOLIC PANEL
ALT: 43 U/L (ref 0–55)
AST: 27 U/L (ref 5–34)
Albumin: 3.4 g/dL — ABNORMAL LOW (ref 3.5–5.0)
Alkaline Phosphatase: 58 U/L (ref 40–150)
Anion Gap: 9 mEq/L (ref 3–11)
BUN: 40.1 mg/dL — AB (ref 7.0–26.0)
CO2: 23 mEq/L (ref 22–29)
Calcium: 9.1 mg/dL (ref 8.4–10.4)
Chloride: 104 mEq/L (ref 98–109)
Creatinine: 1.2 mg/dL (ref 0.7–1.3)
EGFR: 57 mL/min/{1.73_m2} — ABNORMAL LOW (ref >90)
GLUCOSE: 88 mg/dL (ref 70–140)
POTASSIUM: 4.4 meq/L (ref 3.5–5.1)
SODIUM: 136 meq/L (ref 136–145)
Total Bilirubin: 0.43 mg/dL (ref 0.20–1.20)
Total Protein: 6.4 g/dL (ref 6.4–8.3)

## 2016-11-10 LAB — TECHNOLOGIST REVIEW

## 2016-11-10 LAB — CBC WITH DIFFERENTIAL/PLATELET
BASO%: 0.2 % (ref 0.0–2.0)
BASOS ABS: 0 10*3/uL (ref 0.0–0.1)
EOS%: 2.7 % (ref 0.0–7.0)
Eosinophils Absolute: 0.3 10*3/uL (ref 0.0–0.5)
HCT: 42.9 % (ref 38.4–49.9)
HEMOGLOBIN: 14.9 g/dL (ref 13.0–17.1)
LYMPH%: 5.7 % — ABNORMAL LOW (ref 14.0–49.0)
MCH: 32.4 pg (ref 27.2–33.4)
MCHC: 34.7 g/dL (ref 32.0–36.0)
MCV: 93.4 fL (ref 79.3–98.0)
MONO#: 0.7 10*3/uL (ref 0.1–0.9)
MONO%: 7.5 % (ref 0.0–14.0)
NEUT#: 8 10*3/uL — ABNORMAL HIGH (ref 1.5–6.5)
NEUT%: 83.9 % — AB (ref 39.0–75.0)
Platelets: 197 10*3/uL (ref 140–400)
RBC: 4.6 10*6/uL (ref 4.20–5.82)
RDW: 15.1 % — AB (ref 11.0–14.6)
WBC: 9.5 10*3/uL (ref 4.0–10.3)
lymph#: 0.5 10*3/uL — ABNORMAL LOW (ref 0.9–3.3)

## 2016-11-10 LAB — MAGNESIUM: Magnesium: 2.4 mg/dl (ref 1.5–2.5)

## 2016-11-10 MED ORDER — DENOSUMAB 120 MG/1.7ML ~~LOC~~ SOLN
120.0000 mg | Freq: Once | SUBCUTANEOUS | Status: AC
  Administered 2016-11-10: 120 mg via SUBCUTANEOUS
  Filled 2016-11-10: qty 1.7

## 2016-11-10 NOTE — Patient Instructions (Signed)

## 2016-11-10 NOTE — Addendum Note (Signed)
Addended by: [name] on: 11/10/2016 11:38 PM   Modules accepted: Orders

## 2016-11-13 NOTE — Telephone Encounter (Signed)
Called and spoke with family member. Gave her 1st appointment for 8/17 at 8:30am. Family said that was to early and to reschedule. Left a voice message with the new appointment  Information for  8/20 at 2:15 pm.

## 2016-11-20 DIAGNOSIS — R2689 Other abnormalities of gait and mobility: Secondary | ICD-10-CM | POA: Diagnosis not present

## 2016-11-20 DIAGNOSIS — R4189 Other symptoms and signs involving cognitive functions and awareness: Secondary | ICD-10-CM | POA: Diagnosis not present

## 2016-11-22 DIAGNOSIS — R4189 Other symptoms and signs involving cognitive functions and awareness: Secondary | ICD-10-CM | POA: Diagnosis not present

## 2016-11-22 DIAGNOSIS — R2689 Other abnormalities of gait and mobility: Secondary | ICD-10-CM | POA: Diagnosis not present

## 2016-11-23 NOTE — Progress Notes (Signed)
[place]  [phone] Fax:(336) [phone]  Clinic Follow up Note   Patient Care Team: [name], MD as PCP - General (Internal Medicine) 11/27/2016  Chief complaint: Follow-up metastatic melanoma  Oncology History   Metastatic melanoma [facility])   Staging form: Melanoma of the Skin, AJCC 7th Edition     Clinical stage from 09/21/2015: Stage IV [place], [place], [place]) - Signed by [name], MD on 10/09/2015       Metastatic melanoma ([place])   09/16/2015 Imaging    PET scan showed a hypermetabolic 2.2QJ lingular nodule, and a 34m right upper lobe lung nodule, left subclavicular nodes, and bone metastasis in the proximal right humerus and right femur.       09/21/2015 Initial Diagnosis    Metastatic melanoma ([place]      09/21/2015 Initial Biopsy    Left subclavicular lymph node biopsy showed prostatic of melanoma      09/21/2015 Miscellaneous    BRAF V600K mutation (+)       10/04/2015 Imaging    CT head with without contrast showed a 2.0 x 1.6 x 1.7 cm superficial left frontal hyperdense lesion, with postcontrast enhancement, lying within the previous hemorrhagic area, concerning for solitary melanoma metastasis      10/06/2015 - 09/06/2016 Chemotherapy    Nivolumab 2489mevery 2 weeks, held due to his dexamethasone for radionecrosis       10/13/2015 - 10/13/2015 Radiation Therapy    10/13/2015 Preop SRS treatment: 14 Gy  to the left frontal lesion in 1 fraction      10/15/2015 Surgery    left front craniotomy and tumor excision       10/15/2015 Pathology Results    left frontal brain mass excision showed metastatic melanoma, 1.5X1.5X0.6cm      01/13/2016 Imaging    CT head without and with contrast showed ill-defined enhancement and dural thickening of the left frontal resection cavity may represent a post treatment changes or residual neoplasm. A new 6 mm hypodensity in the right cerebellar hemisphere with uncertainty enhancement may represent a metastasis or  small brain parenchymal hemorrhage.       01/27/2016 - 01/27/2016 Radiation Therapy    01/27/16 SRS Treatment:  20 Gy in 1 fraction to a 6 mm right cerebellar brain met       03/29/2016 PET scan    PET 03/29/2016 IMPRESSION: 1. Overall there has been an interval response to therapy. The index lesions sided on previous exam it are less hypermetabolic when compared with the previous exam. 2. There is an intramuscular lesion within the right masseter which has increased FDG uptake compared with previous exam. 3. No new sites of disease identified. 4. Aortic atherosclerosis and infrarenal abdominal aortic aneurysm. Recommend followup by ultrasound in 3 years. This recommendation follows ACR consensus guidelines: White Paper of the ACR Incidental Findings Committee II on Vascular Findings. J Joellyn Ruedadiol 2013; 10:789-794      04/20/2016 Imaging    CT Head w wo contrast 1. Mild patchy enhancement at the left anterior frontal lobe treatment site (series 11, image 39) without mass effect and stable surrounding white matter hypodensity without strong evidence of acute vasogenic edema. Recommend continued CT surveillance. 2. No new metastatic disease or new intracranial abnormality identified. 3. Probable benign 14 mm left parietal bone lucent lesion, unchanged since May 2017.      05/06/2016 - 05/08/2016 Hospital Admission    Pt was admitted for rectal bleeding for one day. Breathing spontaneously  resolved, hemoglobin dropped from 15 to 10.5, did not require blood transfusion. Colonoscopy showed no active bleeding, except diverticulosis and hemorrhoid. He was discharged to home.      05/08/2016 Procedure    Colonoscopy  - Stool in the ascending colon. Fluid aspiration performed. - Diverticulosis in the entire examined colon. - Non-bleeding internal hemorrhoids. - The examination was otherwise normal on direct and retroflexion views.      05/29/2016 Imaging    PET Image  Restaging IMPRESSION: 1. Stable exam with no evidence progression. 2. Photopenia in the LEFT frontal lobe at prior surgical site. 3. Stable mildly hypermetabolic RIGHT paratracheal node. This may represent a thyroid nodule. 4. Stable size of minimally metabolic lingular pulmonary nodule. 5. Stable skeletal lesion of the RIGHT iliac crest 6. No cutaneous metabolic lesion.      07/20/2016 Imaging    CT Head W WO Contrast IMPRESSION: Pronounced change in the left frontal region. Much more regional vasogenic edema. Relatively stable appearance of the abnormal enhancement extending from the lateral margin of the frontal horn of left lateral ventricle to the frontal brain surface. New region of abnormal enhancement surrounding the frontal horn of the left lateral ventricle measuring approximately 3.2 cm. The differential diagnosis is recurrent tumor versus radiation necrosis. The patient is reportedly not an MRI candidate. This area is large enough that CT perfusion could possibly make a reasonable differentiation.      09/02/2016 Imaging    CT Head 09/02/16 IMPRESSION: Somewhat mixed pattern of slight posterior progression of pleomorphic enhancement surrounding the LEFT lateral ventricle although no significant mass effect, and reduced vasogenic edema compared with the scan in April.      09/27/2016 - 11/02/2016 Chemotherapy    Start dabrafenib and trametinib 09/27/16, stopped due to severe fatigue        10/05/2016 Imaging    CT Head w & w/o contrast  IMPRESSION: 1. Decreased size of the enhancing area within the left frontal lobe treatment site. Surrounding edema has also decreased. The findings support continued evolution of radiation necrosis. 2. No new enhancing lesions.      10/18/2016 PET scan    IMPRESSION: 1. New focal hypermetabolism in the proximal sigmoid colon with associated focal colonic wall thickening and mild pericolonic fat stranding at the site of a proximal  sigmoid diverticulum, favored to represent mild acute sigmoid diverticulitis. Metastatic disease is considered less likely. Clinical correlation is necessary. Consider appropriate antibiotic therapy, as clinically warranted. Attention to this region recommended on future PET-CT follow-up. 2. Otherwise complete metabolic response with no evidence of hypermetabolic metastatic disease. Lingular pulmonary nodule is slightly decreased in size and demonstrates no significant metabolism. 3. No residual hypermetabolism in the right thyroid lobe nodule, which is stable in size. 4. Aortic Atherosclerosis (ICD10-I70.0). Stable 3.1 cm infrarenal abdominal aortic aneurysm. Aortic aneurysm NOS (ICD10-I71.9). Recommend followup by ultrasound in 3 years. This recommendation follows ACR consensus guidelines: White Paper of the ACR Incidental Findings Committee II on Vascular Findings. Joellyn Rued Radiol 2013; 81:191-478.      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 He was found to have left frontal hemorrhage 5.7 cm x 4.5 cm with 6 mm left to right shift.During the  workup for his stroke, a left upper lung mass was noticed on the CT scan. I was called to evaluate his lung mass.  Patient has aphasia, not able to communicate much. According to his daughter, Dr. [name], who is a OB GYN at [employer], he probably only smoked for very short period time during the college. He did have 3 skin melanoma, which was removed, last one was 2-3 years ago. No other cancer history.  CURRENT THERAPY:  Supportive care and dexamethasone, tapering dose   INTERVAL HISTORY:  Mr. Sternberg returns for follow-up alone. He was last seen in our clinic two weeks  ago. He had CT head scan earlier today, results pending. He reports he is doing much better today than two weeks ago. He is not currently driving long distances. The other day he drove to a restaurant without issue. He is living in an assisted living facility, [place]. He has some trouble processing between short-term and long-term memories. He is currently taking dexamethasone 1 mg three times daily. He continues to attend physical therapy three times weekly. He continues to attend speech therapy, as well.    REVIEW OF SYSTEMS:  Constitutional: Denies fevers, chills or abnormal weight loss Eyes: Denies blurriness of vision Ears, nose, mouth, throat, and face: Denies mucositis or sore throat Respiratory: Denies cough, dyspnea or wheezes Cardiovascular: Denies palpitation, chest discomfort or lower extremity swelling Gastrointestinal:  Denies nausea, heartburn, hematochezia  Skin: Negative. Lymphatics: Denies new lymphadenopathy or easy bruising Neurological:see HPI  Behavioral/Psych: Mood is stable, no new changes (+) trouble processing short and long term memory All other systems were reviewed with the patient and are negative.  MEDICAL HISTORY:  Past Medical History:  Diagnosis Date  . Anginal pain ([place])   . Arthritis    mostly hands  . Benign familial hematuria   . BPH (benign prostatic hypertrophy)   . Brain cancer ([place])    melanoma with brain met  . Coronary artery disease   . GERD (gastroesophageal reflux disease)   . High cholesterol   . Hypertension   . Hypothyroidism   . Intracerebral hemorrhage ([place])   . Melanoma ([place])   . Metastatic melanoma to lung ([place])   . Mood disorder ([place])   . Myocardial infarction ([place])   . Pacemaker    due to syncope, 3rd degree HB (upgrade to [facility]. Jude CRT-P 02/25/13 (Dr. Uvaldo Rising)  . Rectal bleed    due to NSAIDS  . Renal disorder   . Skin cancer    melanoma  . Sleep apnea    uses CPAP  . Stroke [facility])     SURGICAL HISTORY: Past  Surgical History:  Procedure Laterality Date  . APPLICATION OF CRANIAL NAVIGATION N/A 10/15/2015   Procedure: APPLICATION OF CRANIAL NAVIGATION;  Surgeon: Erline Levine, MD;  Location: [place] NEURO ORS;  Service: Neurosurgery;  Laterality: N/A;  . CARDIAC CATHETERIZATION  2013  . CARDIAC SURGERY     bypass X 2  . COLONOSCOPY WITH PROPOFOL N/A 05/08/2016   Procedure: COLONOSCOPY WITH PROPOFOL;  Surgeon: Jonathon Bellows, MD;  Location[facility] ENDOSCOPY;  Service: Gastroenterology;  Laterality: N/A;  . CORONARY ARTERY BYPASS GRAFT  2013   LIMA-LAD, SVG-PDA 03/27/11 (Dr. Francee Gentile)  . CRANIOTOMY N/A 10/15/2015   Procedure: LEFT FRONTAL CRANIOTOMY TUMOR EXCISION with Curve;  Surgeon: Erline Levine, MD;  Location: [place] NEURO ORS;  Service: Neurosurgery;  Laterality: N/A;  CRANIOTOMY TUMOR EXCISION  . EYE SURGERY    . FOOT SURGERY  08/2010  .  JOINT REPLACEMENT Left    partial knee  . KNEE ARTHROSCOPY  12/2008  . LYMPH NODE BIOPSY    . PACEMAKER INSERTION    . TONSILLECTOMY      I have reviewed the social history and family history with the patient and they are unchanged from previous note.  ALLERGIES:  is allergic to zocor [simvastatin].  MEDICATIONS:  Current Outpatient Prescriptions  Medication Sig Dispense Refill  . aspirin EC 81 MG tablet Take 81 mg by mouth daily.    . carvedilol (COREG) 6.25 MG tablet Take 6.25 mg by mouth 2 (two) times daily with a meal.    . feeding supplement, ENSURE ENLIVE, (ENSURE ENLIVE) LIQD Take 237 mLs by mouth 2 (two) times daily between meals.    Marland Kitchen levothyroxine (SYNTHROID, LEVOTHROID) 175 MCG tablet Take 175 mcg by mouth daily before breakfast.    . pentoxifylline (TRENTAL) 400 MG CR tablet Take 1 tablet (400 mg total) by mouth 2 (two) times daily. 60 tablet 6  . rosuvastatin (CRESTOR) 10 MG tablet Take 10 mg by mouth at bedtime.    . sertraline (ZOLOFT) 50 MG tablet Take 100 mg by mouth daily.     Marland Kitchen testosterone cypionate (DEPOTESTOTERONE CYPIONATE) 100 MG/ML injection  Inject 100 mg into the muscle every 14 (fourteen) days.     . vitamin E (VITAMIN E) 400 UNIT capsule Take 1 capsule (400 Units total) by mouth 2 (two) times daily. 60 capsule 6  . dexamethasone (DECADRON) 1 MG tablet Take 2 tablets (2 mg total) by mouth daily with breakfast. 60 tablet 0   No current facility-administered medications for this visit.    Facility-Administered Medications Ordered in Other Visits  Medication Dose Route Frequency Provider Last Rate Last Dose  . 0.9 %  sodium chloride infusion  1,000 mL Intravenous Once [name], MD      . iopamidol (ISOVUE-300) 61 % injection             PHYSICAL EXAMINATION:  ECOG PERFORMANCE STATUS: 1 BP 125/80 (BP Location: Left Arm, Patient Position: Sitting)   Pulse 66   Temp (!) 97.1 F (36.2 C) (Oral)   Resp 18   Ht 5' 9.5" (1.765 m)   Wt 205 lb 3.2 oz (93.1 kg)   SpO2 95%   BMI 29.87 kg/m  GENERAL:alert, no distress and comfortable SKIN: skin color, texture, turgor are normal, (+) diffuse papular skin rash on bilateral forearms and hand, improved EYES: normal, Conjunctiva are pink and non-injected, sclera clear OROPHARYNX:no exudate, no erythema and lips, buccal mucosa, and tongue normal  NECK: supple, thyroid normal size, non-tender, without nodularity LYMPH:  no palpable lymphadenopathy in the cervical, [place], axillary or inguinal LUNGS: clear to auscultation and percussion with normal breathing effort HEART: regular rate & rhythm and no murmurs and no lower extremity edema ABDOMEN:abdomen soft, non-tender and normal bowel sounds Musculoskeletal:no cyanosis of digits and no clubbing (+)weaker right arm  NEURO: alert & oriented x 3 with fluent speech, no focal motor/sensory deficits, he walks independently, gait is normal   LABORATORY DATA:  I have reviewed the data as listed CBC Latest Ref Rng & Units 11/27/2016 11/10/2016 11/02/2016  WBC 4.0 - 10.3 10e3/uL 6.4 9.5 6.3  Hemoglobin 13.0 - 17.1 g/dL 14.0 14.9 13.4  Hematocrit  38.4 - 49.9 % 41.2 42.9 39.0  Platelets 140 - 400 10e3/uL 163 197 167     CMP Latest Ref Rng & Units 11/27/2016 11/10/2016 11/04/2016  Glucose 70 - 140 mg/dl 105  88 103(H)  BUN 7.0 - 26.0 mg/dL 44.3(H) 40.1(H) 49(H)  Creatinine 0.7 - 1.3 mg/dL 1.3 1.2 1.51(H)  Sodium 136 - 145 mEq/L 142 136 141  Potassium 3.5 - 5.1 mEq/L 4.6 4.4 3.9  Chloride 101 - 111 mmol/L - - 105  CO2 22 - 29 mEq/L 28 23 26   Calcium 8.4 - 10.4 mg/dL 9.6 9.1 10.7(H)  Total Protein 6.4 - 8.3 g/dL 6.4 6.4 -  Total Bilirubin 0.20 - 1.20 mg/dL 0.51 0.43 -  Alkaline Phos 40 - 150 U/L 46 58 -  AST 5 - 34 U/L 15 27 -  ALT 0 - 55 U/L 19 43 -   Results for IOSEFA, WEINTRAUB (MRN [id number]) as of 11/27/2016 13:46  Ref. Range 08/23/2016 10:16 09/06/2016 08:51 09/20/2016 09:51 10/19/2016 09:25  TSH Latest Ref Range: 0.320 - 4.118 m(IU)/L 13.758 (H) 2.376 0.941 5.431 (H)    PATHOLOGY REPORT: Diagnosis 09/21/2015 Lymph node, needle/core biopsy, hypermetabolic left sub clavicular LN - METASTATIC MALIGNANT MELANOMA. - SEE COMMENT.  Diagnosis 10/15/2015 Brain, for tumor resection, Left frontal - METASTATIC MELANOMA.     PROCEDURES: Colonoscopy 05/08/16 - Stool in the ascending colon. Fluid aspiration performed. - Diverticulosis in the entire examined colon. - Non-bleeding internal hemorrhoids. - The examination was otherwise normal on direct and retroflexion views.  RADIOGRAPHIC STUDIES: I have personally reviewed the radiological images as listed and agreed with the findings in the report. Ct Head W Wo Contrast  Result Date: 11/27/2016 CLINICAL DATA:  Melanoma and brain metastases. Status post brain surgery and radiation therapy. Currently undergoing immunotherapy. EXAM: CT HEAD WITHOUT AND WITH CONTRAST TECHNIQUE: Contiguous axial images were obtained from the base of the skull through the vertex without and with intravenous contrast CONTRAST:  69m ISOVUE-300 IOPAMIDOL (ISOVUE-300) INJECTION 61% COMPARISON:  Head CT 10/05/2016  and 09/02/2016 FINDINGS: Brain: The area of cortical enhancements in the left frontal lobe has decreased compared to the prior study. There are no new areas of contrast enhancement. No acute hemorrhage. No midline shift or mass effect. Hypoattenuation in the left frontal white matter is slightly decreased. Persistent ex vacuo dilatation of the left lateral ventricle. Vascular: No hyperdense vessel or unexpected calcification. Skull: Remote left frontal craniotomy. Sinuses/Orbits: No sinus fluid levels or advanced mucosal thickening. No mastoid effusion. Normal orbits. IMPRESSION: 1. Continued evolution of post radiation changes in the left frontal lobe with further decrease in the area of contrast enhancement and adjacent edema. 2. No new contrast-enhancing lesions. Electronically Signed   By: KUlyses JarredM.D.   On: 11/27/2016 14:20     CT Head with and without contrast 11/27/2016 IMPRESSION: 1. Continued evolution of post radiation changes in the left frontal lobe with further decrease in the area of contrast enhancement and adjacent edema. 2. No new contrast-enhancing lesions.  PET 10/18/16 IMPRESSION: 1. New focal hypermetabolism in the proximal sigmoid colon with associated focal colonic wall thickening and mild pericolonic fat stranding at the site of a proximal sigmoid diverticulum, favored to represent mild acute sigmoid diverticulitis. Metastatic disease is considered less likely. Clinical correlation is necessary. Consider appropriate antibiotic therapy, as clinically warranted. Attention to this region recommended on future PET-CT follow-up. 2. Otherwise complete metabolic response with no evidence of hypermetabolic metastatic disease. Lingular pulmonary nodule is slightly decreased in size and demonstrates no significant metabolism. 3. No residual hypermetabolism in the right thyroid lobe nodule, which is stable in size. 4. Aortic Atherosclerosis (ICD10-I70.0). Stable 3.1 cm  infrarenal abdominal aortic aneurysm. Aortic aneurysm NOS (ICD10-I71.9).  Recommend followup by ultrasound in 3 years. This recommendation follows ACR consensus guidelines: White Paper of the ACR Incidental Findings Committee II on Vascular Findings. J Am Coll Radiol 2013;   CT Head W and WO Contrast 10/05/16 IMPRESSION: 1. Decreased size of the enhancing area within the left frontal lobe treatment site. Surrounding edema has also decreased. The findings support continued evolution of radiation necrosis. 2. No new enhancing lesions.  CT Head 09/02/16 IMPRESSION: Somewhat mixed pattern of slight posterior progression of pleomorphic enhancement surrounding the LEFT lateral ventricle although no significant mass effect, and reduced vasogenic edema compared with the scan in April.  CT Head W WO Contrast Jul 21, 2016 IMPRESSION: Pronounced change in the left frontal region. Much more regional vasogenic edema. Relatively stable appearance of the abnormal enhancement extending from the lateral margin of the frontal horn of left lateral ventricle to the frontal brain surface. New region of abnormal enhancement surrounding the frontal horn of the left lateral ventricle measuring approximately 3.2 cm. The differential diagnosis is recurrent tumor versus radiation necrosis. The patient is reportedly not an MRI candidate. This area is large enough that CT perfusion could possibly make a reasonable differentiation.  PET Image Restage 05/29/16 IMPRESSION: 1. Stable exam with no evidence progression. 2. Photopenia in the LEFT frontal lobe at prior surgical site. 3. Stable mildly hypermetabolic RIGHT paratracheal node. This may represent a thyroid nodule. 4. Stable size of minimally metabolic lingular pulmonary nodule. 5. Stable skeletal lesion of the RIGHT iliac crest 6. No cutaneous metabolic lesion.   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   1. Metastatic melanoma to lung, node, bone and brain, BRAF V600K mutation (+) -I previously  reviewed his PET/CT scan images with pt and his family members. The scan revealed hypermetabolic left upper lobe lung mass, 2 small right lung nodules, probable bone metastasis, and a subcutaneous left supraclavicular lesion, which is not palpable  -His repeat CT had showed a 2.0 cm left frontal hyperdense lesion with enhancement on contrast, in the area of his recent hemorrhagic stroke, highly suspicious for brain metastasis.  -We previously discussed his left supraclavicular node biopsy results, which showed metastatic melanoma.. -His case was discussed in our sarcoma tumor board. -I previously recommended a first-line treatment with Nivolumab alone, given his recent stroke, low tumor burden. I'll reserve BRAF inhibitor with or without MEK inhibitor as a second line therapy if he progress on immunotherapy. Other options of immunotherapy, especially checkpoint inhibitor with ipilumimab, were also discussed with pt and his daughter -He has been tolerating Nivolumab well, no significant side effects except fatigue. It has been held Leslie due to his chronic steroids use -He has had near complete metabolic response on the recent restaging PET scan in July 2018 -We'll continue Delton See monthly for his bone metastasis. 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  -Follow up in  3 weeks. He will restart Nivolumab at that time if he recovers well   3. Hemorrhagic stroke and brain metastasis  -I encouraged him to continue physical therapy and occupational therapy. 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 However his clinical neurological function has deteriorated -I have previously discussed with our neuro-oncologist Dr. Mickeal Skinner, and both of [place] feel he is not a candidate for Avastin for his radionecrosis due to the risk of bleeding  -I reviewed his restaging CT had with our neuro-oncologist Dr. Mickeal Skinner, his radionecrosis has improved, no other new lesions. He was also seen by Dr. Mickeal Skinner today who feels he is recovering well from neurological standpoint -I'll gradually and slowly taper off his dexamethasone. He is currently on 3 mg daily, I'll reduce to 2 mg daily, until next visit in 3 weeks   4. 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 Mickeal Skinner to discuss treatment options to improve his neurologic function, patient is very frustrated about this, which significantly impacting his quality of life. -continue PT/OT  -CT Head 11/27/2016 showed continued evolution of post radiation changes in the left frontal lobe with further decrease in the area of contrast enhancement and  adjacent edema. No new contrast-enhancing lesions. The patient reviewed these results with neuro-oncologist, Dr. Mickeal Skinner today.  5.  Hypercalcemia and AKI  -he was found to have calcium 13.4 today and Cr 2.0 on his office visit 11/01/2016 -Possibly related to his underlying malignancy (bone mets) and missing one dose of Xgeva  -He has restarted Xgeva monthly. Calcium has been normal   6. Fatigue, hypothyroidism and hypotestosteronemia -He has baseline hypothyroidism and hypotestosteronemia -His fatigue has much improved since he restarted testosterone injection by his primary care physician -He will follow-up with his primary care physician Dr. Asher Muir for his Synthroid dose adjustment  -His TSH was slightly In November 2017. TSH 9.6 on 06/15/16. - TSH 25.01 on 08/10/16. TSH 5.431 TODAY. -The patient will continue with light exercise as he is able. -I again encouraged him to be active  7. Anemia and history of GI bleeding  -his anemia has resolved now -I previously encouraged him to continue oral iron supplement for a total of 3 months   8. HTN -he is on amlodipine and carvedilol, follow-up of his primary care physician -Due to drug interaction with new oral chemo he iwll ned to monitor his BP more often.   9. Depression  -he is on zoloft 81m daily, management by his PCP  -improved overall   10. CKD stage III -His Cr was previously slightly elevated -I previously encouraged him to drink water adequately -avoid NSAIDs  11. Goal of care discussion  -We previously discussed the incurable nature of his metastatic melanoma, and the overall poor prognosis, especially if he does not have good response to chemotherapy or progress on chemo -The patient understands the goal of care is palliative. -I recommend DNR/DNI, he will think about it  12. CAD -history of 2 double vessel bypass -He has pace maker -per pt (her daughter) request, will switch his cardiologist to Dr. Haroldine Laws    Plan - He will see neuro-oncologist Dr. Mickeal Skinner after our visit today to discuss today's CT Head results. -Discussed steroid taper today. He is currently dexamethasone 3 mg daily and will reduce to 2 mg daily. I called in dexamethasone 72m pills today. Patient was given written instruction on steroid taper. -I will order PET scan to be completed in 3 weeks.  -I will see him again in 3 weeks. At that time he will restart Nivolumab, and futher taper his dexa  -I communicated and updated his condition to his daughter KClaiborne Billings  All questions were answered. The patient knows to call the clinic with any problems, questions or concerns. No barriers to learning was detected.  I spent 25 minutes counseling the patient face to face. The total time spent in the appointment was 30 minutes and more than 50% was on counseling and review of test results.  This document serves as a record of services personally performed by YTruitt Merle MD. It was created on her behalf by EArlyce Harman a trained medical scribe. The creation of this record is based on the scribe's personal observations and the provider's statements to them. This document has been checked and approved by the attending provider.  FTruitt Merle MD  11/27/16

## 2016-11-24 ENCOUNTER — Other Ambulatory Visit: Payer: Medicare Other

## 2016-11-24 ENCOUNTER — Ambulatory Visit: Payer: Medicare Other | Admitting: Hematology

## 2016-11-24 DIAGNOSIS — R4189 Other symptoms and signs involving cognitive functions and awareness: Secondary | ICD-10-CM | POA: Diagnosis not present

## 2016-11-24 DIAGNOSIS — R2689 Other abnormalities of gait and mobility: Secondary | ICD-10-CM | POA: Diagnosis not present

## 2016-11-27 ENCOUNTER — Ambulatory Visit (HOSPITAL_BASED_OUTPATIENT_CLINIC_OR_DEPARTMENT_OTHER): Payer: Medicare Other | Admitting: Hematology

## 2016-11-27 ENCOUNTER — Ambulatory Visit (HOSPITAL_COMMUNITY)
Admission: RE | Admit: 2016-11-27 | Discharge: 2016-11-27 | Disposition: A | Discharge status: Home / Self Care | Payer: Medicare Other | Source: Ambulatory Visit | Attending: Hematology | Admitting: Hematology

## 2016-11-27 ENCOUNTER — Other Ambulatory Visit (HOSPITAL_BASED_OUTPATIENT_CLINIC_OR_DEPARTMENT_OTHER): Payer: Medicare Other

## 2016-11-27 ENCOUNTER — Telehealth: Payer: Self-pay | Admitting: Hematology

## 2016-11-27 VITALS — BP 125/80 | HR 66 | Temp 97.1°F | Resp 18 | Ht 69.5 in | Wt 205.2 lb

## 2016-11-27 DIAGNOSIS — C770 Secondary and unspecified malignant neoplasm of lymph nodes of head, face and neck: Secondary | ICD-10-CM | POA: Diagnosis not present

## 2016-11-27 DIAGNOSIS — C7931 Secondary malignant neoplasm of brain: Secondary | ICD-10-CM | POA: Diagnosis not present

## 2016-11-27 DIAGNOSIS — Z85820 Personal history of malignant melanoma of skin: Secondary | ICD-10-CM | POA: Insufficient documentation

## 2016-11-27 DIAGNOSIS — C439 Malignant melanoma of skin, unspecified: Secondary | ICD-10-CM

## 2016-11-27 DIAGNOSIS — Z923 Personal history of irradiation: Secondary | ICD-10-CM | POA: Insufficient documentation

## 2016-11-27 DIAGNOSIS — C799 Secondary malignant neoplasm of unspecified site: Secondary | ICD-10-CM

## 2016-11-27 DIAGNOSIS — C7951 Secondary malignant neoplasm of bone: Secondary | ICD-10-CM | POA: Diagnosis not present

## 2016-11-27 DIAGNOSIS — R2689 Other abnormalities of gait and mobility: Secondary | ICD-10-CM | POA: Diagnosis not present

## 2016-11-27 DIAGNOSIS — I10 Essential (primary) hypertension: Secondary | ICD-10-CM

## 2016-11-27 DIAGNOSIS — N183 Chronic kidney disease, stage 3 (moderate): Secondary | ICD-10-CM

## 2016-11-27 DIAGNOSIS — G936 Cerebral edema: Secondary | ICD-10-CM | POA: Insufficient documentation

## 2016-11-27 DIAGNOSIS — E039 Hypothyroidism, unspecified: Secondary | ICD-10-CM | POA: Diagnosis not present

## 2016-11-27 DIAGNOSIS — R4189 Other symptoms and signs involving cognitive functions and awareness: Secondary | ICD-10-CM | POA: Diagnosis not present

## 2016-11-27 LAB — COMPREHENSIVE METABOLIC PANEL
ALT: 19 U/L (ref 0–55)
ANION GAP: 7 meq/L (ref 3–11)
AST: 15 U/L (ref 5–34)
Albumin: 3.2 g/dL — ABNORMAL LOW (ref 3.5–5.0)
Alkaline Phosphatase: 46 U/L (ref 40–150)
BUN: 44.3 mg/dL — ABNORMAL HIGH (ref 7.0–26.0)
CHLORIDE: 107 meq/L (ref 98–109)
CO2: 28 meq/L (ref 22–29)
CREATININE: 1.3 mg/dL (ref 0.7–1.3)
Calcium: 9.6 mg/dL (ref 8.4–10.4)
EGFR: 51 mL/min/{1.73_m2} — AB (ref >90)
GLUCOSE: 105 mg/dL (ref 70–140)
Potassium: 4.6 mEq/L (ref 3.5–5.1)
SODIUM: 142 meq/L (ref 136–145)
Total Bilirubin: 0.51 mg/dL (ref 0.20–1.20)
Total Protein: 6.4 g/dL (ref 6.4–8.3)

## 2016-11-27 LAB — CBC WITH DIFFERENTIAL/PLATELET
BASO%: 0.3 % (ref 0.0–2.0)
Basophils Absolute: 0 10*3/uL (ref 0.0–0.1)
EOS%: 2.4 % (ref 0.0–7.0)
Eosinophils Absolute: 0.2 10*3/uL (ref 0.0–0.5)
HCT: 41.2 % (ref 38.4–49.9)
HGB: 14 g/dL (ref 13.0–17.1)
LYMPH%: 6.1 % — AB (ref 14.0–49.0)
MCH: 32.8 pg (ref 27.2–33.4)
MCHC: 34 g/dL (ref 32.0–36.0)
MCV: 96.3 fL (ref 79.3–98.0)
MONO#: 0.4 10*3/uL (ref 0.1–0.9)
MONO%: 6.9 % (ref 0.0–14.0)
NEUT#: 5.4 10*3/uL (ref 1.5–6.5)
NEUT%: 84.3 % — AB (ref 39.0–75.0)
PLATELETS: 163 10*3/uL (ref 140–400)
RBC: 4.28 10*6/uL (ref 4.20–5.82)
RDW: 14.8 % — ABNORMAL HIGH (ref 11.0–14.6)
WBC: 6.4 10*3/uL (ref 4.0–10.3)
lymph#: 0.4 10*3/uL — ABNORMAL LOW (ref 0.9–3.3)

## 2016-11-27 LAB — MAGNESIUM: Magnesium: 2.5 mg/dl (ref 1.5–2.5)

## 2016-11-27 MED ORDER — DEXAMETHASONE 1 MG PO TABS: 2.0000 mg | ORAL_TABLET | Freq: Two times a day (BID) | ORAL | 0 refills | Status: DC

## 2016-11-27 MED ORDER — IOPAMIDOL (ISOVUE-300) INJECTION 61%
INTRAVENOUS | Status: AC
  Filled 2016-11-27: qty 75

## 2016-11-27 MED ORDER — IOPAMIDOL (ISOVUE-300) INJECTION 61%
75.0000 mL | Freq: Once | INTRAVENOUS | Status: AC | PRN
  Administered 2016-11-27: 75 mL via INTRAVENOUS

## 2016-11-27 MED ORDER — DEXAMETHASONE 1 MG PO TABS: 2.0000 mg | ORAL_TABLET | Freq: Every day | ORAL | 0 refills | Status: DC

## 2016-11-27 NOTE — Telephone Encounter (Signed)
Scheduled appt per 8/20 los - Gave patient AVS and calender per los.

## 2016-11-29 DIAGNOSIS — R4189 Other symptoms and signs involving cognitive functions and awareness: Secondary | ICD-10-CM | POA: Diagnosis not present

## 2016-11-29 DIAGNOSIS — R2689 Other abnormalities of gait and mobility: Secondary | ICD-10-CM | POA: Diagnosis not present

## 2016-11-29 MED ORDER — DEXAMETHASONE 1 MG PO TABS: 2.0000 mg | ORAL_TABLET | Freq: Every day | ORAL | 0 refills | Status: DC

## 2016-12-01 DIAGNOSIS — R4189 Other symptoms and signs involving cognitive functions and awareness: Secondary | ICD-10-CM | POA: Diagnosis not present

## 2016-12-01 DIAGNOSIS — R2689 Other abnormalities of gait and mobility: Secondary | ICD-10-CM | POA: Diagnosis not present

## 2016-12-02 ENCOUNTER — Encounter: Payer: Self-pay | Admitting: Hematology

## 2016-12-04 DIAGNOSIS — R4189 Other symptoms and signs involving cognitive functions and awareness: Secondary | ICD-10-CM | POA: Diagnosis not present

## 2016-12-04 DIAGNOSIS — R2689 Other abnormalities of gait and mobility: Secondary | ICD-10-CM | POA: Diagnosis not present

## 2016-12-06 DIAGNOSIS — R4189 Other symptoms and signs involving cognitive functions and awareness: Secondary | ICD-10-CM | POA: Diagnosis not present

## 2016-12-06 DIAGNOSIS — R2689 Other abnormalities of gait and mobility: Secondary | ICD-10-CM | POA: Diagnosis not present

## 2016-12-08 DIAGNOSIS — R4189 Other symptoms and signs involving cognitive functions and awareness: Secondary | ICD-10-CM | POA: Diagnosis not present

## 2016-12-08 DIAGNOSIS — R2689 Other abnormalities of gait and mobility: Secondary | ICD-10-CM | POA: Diagnosis not present

## 2016-12-12 DIAGNOSIS — R4189 Other symptoms and signs involving cognitive functions and awareness: Secondary | ICD-10-CM | POA: Diagnosis not present

## 2016-12-13 ENCOUNTER — Ambulatory Visit (HOSPITAL_COMMUNITY)
Admission: RE | Admit: 2016-12-13 | Discharge: 2016-12-13 | Disposition: A | Discharge status: Home / Self Care | Payer: Medicare Other | Source: Ambulatory Visit | Attending: Hematology | Admitting: Hematology

## 2016-12-13 DIAGNOSIS — I714 Abdominal aortic aneurysm, without rupture: Secondary | ICD-10-CM | POA: Insufficient documentation

## 2016-12-13 DIAGNOSIS — R911 Solitary pulmonary nodule: Secondary | ICD-10-CM | POA: Insufficient documentation

## 2016-12-13 DIAGNOSIS — I700 Atherosclerosis of aorta: Secondary | ICD-10-CM | POA: Diagnosis not present

## 2016-12-13 DIAGNOSIS — C799 Secondary malignant neoplasm of unspecified site: Secondary | ICD-10-CM | POA: Diagnosis not present

## 2016-12-13 DIAGNOSIS — C439 Malignant melanoma of skin, unspecified: Secondary | ICD-10-CM | POA: Diagnosis not present

## 2016-12-13 DIAGNOSIS — I2510 Atherosclerotic heart disease of native coronary artery without angina pectoris: Secondary | ICD-10-CM | POA: Insufficient documentation

## 2016-12-13 DIAGNOSIS — R4189 Other symptoms and signs involving cognitive functions and awareness: Secondary | ICD-10-CM | POA: Diagnosis not present

## 2016-12-13 DIAGNOSIS — Z951 Presence of aortocoronary bypass graft: Secondary | ICD-10-CM | POA: Insufficient documentation

## 2016-12-13 DIAGNOSIS — I723 Aneurysm of iliac artery: Secondary | ICD-10-CM | POA: Insufficient documentation

## 2016-12-13 DIAGNOSIS — K5730 Diverticulosis of large intestine without perforation or abscess without bleeding: Secondary | ICD-10-CM | POA: Insufficient documentation

## 2016-12-13 LAB — GLUCOSE, CAPILLARY: GLUCOSE-CAPILLARY: 82 mg/dL (ref 65–99)

## 2016-12-14 NOTE — Progress Notes (Signed)
[place]  Telephone:([phone] Fax:([phone]) [phone]  Clinic Follow up Note   Patient Care Team: [name], MD as PCP - General (Internal Medicine) 12/18/2016  Chief complaint: Follow-up metastatic melanoma  Oncology History   Metastatic melanoma [facility])   Staging form: Melanoma of the Skin, AJCC 7th Edition     Clinical stage from 09/21/2015: Stage IV [place], [place], [place]) - Signed by [name], MD on 10/09/2015       Metastatic melanoma ([place])   09/16/2015 Imaging    PET scan showed a hypermetabolic 2.5DG lingular nodule, and a 80m right upper lobe lung nodule, left subclavicular nodes, and bone metastasis in the proximal right humerus and right femur.       09/21/2015 Initial Diagnosis    [place]astatic [place]      09/21/2015 Initial Biopsy    Left subclavicular lymph node biopsy showed prostatic of melanoma      09/21/2015 Miscellaneous    BRAF V600K mutation (+)       10/04/2015 Imaging    CT head with without contrast showed a 2.0 x 1.6 x 1.7 cm superficial left frontal hyperdense lesion, with postcontrast enhancement, lying within the previous hemorrhagic area, concerning for solitary melanoma metastasis      10/06/2015 - 09/06/2016 Chemotherapy    Nivolumab [id number] 2 weeks, held due to his dexamethasone for radionecrosis       10/13/2015 - 10/13/2015 Radiation Therapy    10/13/2015 Preop SRS treatment: 14 Gy  to the left frontal lesion in 1 fraction      10/15/2015 Surgery    left front craniotomy and tumor excision       10/15/2015 Pathology Results    left frontal brain mass excision showed metastatic melanoma, 1.5X1.5X0.6cm      01/13/2016 Imaging    CT head without and with contrast showed ill-defined enhancement and dural thickening of the left frontal resection cavity may represent a post treatment changes or residual neoplasm. A new 6 mm hypodensity in the right cerebellar hemisphere with uncertainty enhancement may represent a metastasis or  small brain parenchymal hemorrhage.       01/27/2016 - 01/27/2016 Radiation Therapy    01/27/16 SRS Treatment:  20 Gy in 1 fraction to a 6 mm right cerebellar brain met       03/29/2016 PET scan    PET 03/29/2016 IMPRESSION: 1. Overall there has been an interval response to therapy. The index lesions sided on previous exam it are less hypermetabolic when compared with the previous exam. 2. There is an intramuscular lesion within the right masseter which has increased FDG uptake compared with previous exam. 3. No new sites of disease identified. 4. Aortic atherosclerosis and infrarenal abdominal aortic aneurysm. Recommend followup by ultrasound in 3 years. This recommendation follows ACR consensus guidelines: White Paper of the ACR Incidental Findings Committee II on Vascular Findings. J Joellyn Ruedadiol 2013; 10:789-794      04/20/2016 Imaging    CT Head w wo contrast 1. Mild patchy enhancement at the left anterior frontal lobe treatment site (series 11, image 39) without mass effect and stable surrounding white matter hypodensity without strong evidence of acute vasogenic edema. Recommend continued CT surveillance. 2. No new metastatic disease or new intracranial abnormality identified. 3. Probable benign 14 mm left parietal bone lucent lesion, unchanged since May 2017.      05/06/2016 - 05/08/2016 Hospital Admission    Pt was admitted for rectal bleeding for one day. Breathing spontaneously  resolved, hemoglobin dropped from 15 to 10.5, did not require blood transfusion. Colonoscopy showed no active bleeding, except diverticulosis and hemorrhoid. He was discharged to home.      05/08/2016 Procedure    Colonoscopy  - Stool in the ascending colon. Fluid aspiration performed. - Diverticulosis in the entire examined colon. - Non-bleeding internal hemorrhoids. - The examination was otherwise normal on direct and retroflexion views.      05/29/2016 Imaging    PET Image  Restaging IMPRESSION: 1. Stable exam with no evidence progression. 2. Photopenia in the LEFT frontal lobe at prior surgical site. 3. Stable mildly hypermetabolic RIGHT paratracheal node. This may represent a thyroid nodule. 4. Stable size of minimally metabolic lingular pulmonary nodule. 5. Stable skeletal lesion of the RIGHT iliac crest 6. No cutaneous metabolic lesion.      07/20/2016 Imaging    CT Head W WO Contrast IMPRESSION: Pronounced change in the left frontal region. Much more regional vasogenic edema. Relatively stable appearance of the abnormal enhancement extending from the lateral margin of the frontal horn of left lateral ventricle to the frontal brain surface. New region of abnormal enhancement surrounding the frontal horn of the left lateral ventricle measuring approximately 3.2 cm. The differential diagnosis is recurrent tumor versus radiation necrosis. The patient is reportedly not an MRI candidate. This area is large enough that CT perfusion could possibly make a reasonable differentiation.      09/02/2016 Imaging    CT Head 09/02/16 IMPRESSION: Somewhat mixed pattern of slight posterior progression of pleomorphic enhancement surrounding the LEFT lateral ventricle although no significant mass effect, and reduced vasogenic edema compared with the scan in April.      09/27/2016 - 11/02/2016 Chemotherapy    Start dabrafenib and trametinib 09/27/16, stopped due to severe fatigue        10/05/2016 Imaging    CT Head w & w/o contrast  IMPRESSION: 1. Decreased size of the enhancing area within the left frontal lobe treatment site. Surrounding edema has also decreased. The findings support continued evolution of radiation necrosis. 2. No new enhancing lesions.      10/18/2016 PET scan    IMPRESSION: 1. New focal hypermetabolism in the proximal sigmoid colon with associated focal colonic wall thickening and mild pericolonic fat stranding at the site of a proximal  sigmoid diverticulum, favored to represent mild acute sigmoid diverticulitis. Metastatic disease is considered less likely. Clinical correlation is necessary. Consider appropriate antibiotic therapy, as clinically warranted. Attention to this region recommended on future PET-CT follow-up. 2. Otherwise complete metabolic response with no evidence of hypermetabolic metastatic disease. Lingular pulmonary nodule is slightly decreased in size and demonstrates no significant metabolism. 3. No residual hypermetabolism in the right thyroid lobe nodule, which is stable in size. 4. Aortic Atherosclerosis (ICD10-I70.0). Stable 3.1 cm infrarenal abdominal aortic aneurysm. Aortic aneurysm NOS (ICD10-I71.9). Recommend followup by ultrasound in 3 years. This recommendation follows ACR consensus guidelines: White Paper of the ACR Incidental Findings Committee II on Vascular Findings. Joellyn Rued Radiol 2013; 66:063-016.      11/01/2016 -  Hospital Admission    Patient admitted to the hospital due to hypercalcemia where he received IV fluids and pamidronate      12/13/2016 PET scan    PET 12/13/16 IMPRESSION: 1. No findings to suggest metastatic disease on today's examination. 2. Previously noted hypermetabolism in the sigmoid colon has resolved, presumably indicative of a focus of diverticulitis on the prior study. Today's study does demonstrate relatively diffuse hypermetabolism throughout the  cecum, ascending colon and proximal transverse colon where there is some mild mural thickening, favored to reflect mild chronic inflammation related to underlying diverticular disease. No definite inflammatory changes on the CT portion of the examination to strongly suggest an active acute diverticulitis at this time. No discrete lesion to suggest neoplasm. 3. Previously noted 8 mm lingular nodule is stable in size and known remains non hypermetabolic. This is nonspecific but reassuring. Continued attention on  future followup studies is recommended. 4. Aortic atherosclerosis, in addition to left main and 3 vessel coronary artery disease. Status post median sternotomy for CABG including LIMA to the LAD. 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 He was found to have left frontal hemorrhage 5.7 cm x 4.5 cm with 6 mm left to right shift.During the workup for his stroke, a left upper lung mass was noticed on the CT scan. I was called to evaluate his lung mass.  Patient has aphasia, not able to communicate much. According to his daughter, Dr. [name], who is a OB GYN at [employer], he probably only smoked for very short period time during the college. He did have 3 skin melanoma, which was removed, last one was 2-3 years ago. No other cancer history.  CURRENT THERAPY:  Supportive care and dexamethasone, tapering dose restart nivolamab today, every 2 weeks.    INTERVAL HISTORY: Mr. Williamson returns for follow-up alone. He notes to not exercising much anymore. He is able to work on his memory but his short-term memory is terrible. He does piano and reading to help his brain function. He only drives places .5 miles. He has more energy and his appetite is better.  At home he watches tv and he plays the piano and plays with his dog and wife. He has someone who comes to his house to help them cook. He does not need to walk with cane. He is careful when he walks.    REVIEW OF SYSTEMS:  Constitutional: Denies fevers, chills or abnormal weight loss Eyes: Denies blurriness of vision Ears, nose, mouth, throat, and face: Denies mucositis or sore throat Respiratory: Denies cough, dyspnea or  wheezes Cardiovascular: Denies palpitation, chest discomfort or lower extremity swelling Gastrointestinal:  Denies nausea, heartburn, hematochezia  Skin: Negative. Lymphatics: Denies new lymphadenopathy or easy bruising Neurological:see HPI  Behavioral/Psych: Mood is stable, no new changes (+) trouble processing short and long term memory All other systems were reviewed with the patient and are negative.  MEDICAL HISTORY:  Past Medical History:  Diagnosis Date  . Anginal pain ([place])   . Arthritis    mostly hands  . Benign familial hematuria   . BPH (benign prostatic hypertrophy)   . Brain cancer ([place])    melanoma with brain met  . Coronary artery disease   . GERD (gastroesophageal reflux disease)   . High cholesterol   . Hypertension   . Hypothyroidism   . Intracerebral hemorrhage ([place])   . Melanoma ([place])   . Metastatic melanoma to lung ([place])   . Mood disorder ([place])   . Myocardial infarction ([place])   . Pacemaker    due to syncope, 3rd degree HB (upgrade to [facility]. Jude CRT-P 02/25/13 (Dr. Uvaldo Rising)  .  Rectal bleed    due to NSAIDS  . Renal disorder   . Skin cancer    melanoma  . Sleep apnea    uses CPAP  . Stroke [facility])     SURGICAL HISTORY: Past Surgical History:  Procedure Laterality Date  . APPLICATION OF CRANIAL NAVIGATION N/A 10/15/2015   Procedure: APPLICATION OF CRANIAL NAVIGATION;  Surgeon: Erline Levine, MD;  Location: [place] NEURO ORS;  Service: Neurosurgery;  Laterality: N/A;  . CARDIAC CATHETERIZATION  2013  . CARDIAC SURGERY     bypass X 2  . COLONOSCOPY WITH PROPOFOL N/A 05/08/2016   Procedure: COLONOSCOPY WITH PROPOFOL;  Surgeon: Jonathon Bellows, MD;  Location: [facility] [place] ENDOSCOPY;  Service: Gastroenterology;  Laterality: N/A;  . CORONARY ARTERY BYPASS GRAFT  2013   LIMA-LAD, SVG-PDA 03/27/11 (Dr. Francee Gentile)  . CRANIOTOMY N/A 10/15/2015   Procedure: LEFT FRONTAL CRANIOTOMY TUMOR EXCISION with Curve;  Surgeon: Erline Levine, MD;  Location: [place] NEURO ORS;  Service: Neurosurgery;   Laterality: N/A;  CRANIOTOMY TUMOR EXCISION  . EYE SURGERY    . FOOT SURGERY  08/2010  . JOINT REPLACEMENT Left    partial knee  . KNEE ARTHROSCOPY  12/2008  . LYMPH NODE BIOPSY    . PACEMAKER INSERTION    . TONSILLECTOMY      I have reviewed the social history and family history with the patient and they are unchanged from previous note.  ALLERGIES:  is allergic to zocor [simvastatin].  MEDICATIONS:  Current Outpatient Prescriptions  Medication Sig Dispense Refill  . aspirin EC 81 MG tablet Take 81 mg by mouth daily.    . carvedilol (COREG) 6.25 MG tablet Take 6.25 mg by mouth 2 (two) times daily with a meal.    . dexamethasone (DECADRON) 1 MG tablet Take 2 tablets (2 mg total) by mouth daily with breakfast. 60 tablet 0  . feeding supplement, ENSURE ENLIVE, (ENSURE ENLIVE) LIQD Take 237 mLs by mouth 2 (two) times daily between meals.    Marland Kitchen levothyroxine (SYNTHROID, LEVOTHROID) 175 MCG tablet Take 175 mcg by mouth daily before breakfast.    . pentoxifylline (TRENTAL) 400 MG CR tablet Take 1 tablet (400 mg total) by mouth 2 (two) times daily. 60 tablet 6  . rosuvastatin (CRESTOR) 10 MG tablet Take 10 mg by mouth at bedtime.    . sertraline (ZOLOFT) 50 MG tablet Take 100 mg by mouth daily.     Marland Kitchen testosterone cypionate (DEPOTESTOTERONE CYPIONATE) 100 MG/ML injection Inject 100 mg into the muscle every 14 (fourteen) days.     . vitamin E (VITAMIN E) 400 UNIT capsule Take 1 capsule (400 Units total) by mouth 2 (two) times daily. 60 capsule 6   No current facility-administered medications for this visit.    Facility-Administered Medications Ordered in Other Visits  Medication Dose Route Frequency Provider Last Rate Last Dose  . 0.9 %  sodium chloride infusion  1,000 mL Intravenous Once [name], MD        PHYSICAL EXAMINATION:  ECOG PERFORMANCE STATUS: 1 BP 120/83 (BP Location: Left Arm, Patient Position: Sitting)   Pulse 76   Temp 97.8 F (36.6 C) (Oral)   Resp 18   Ht 5' 9.5"  (1.765 m)   Wt 207 lb 14.4 oz (94.3 kg)   SpO2 97%   BMI 30.26 kg/m    GENERAL:alert, no distress and comfortable SKIN: skin color, texture, turgor are normal, (+) diffuse papular skin rash on bilateral forearms and hand, improved EYES: normal, Conjunctiva are pink and  non-injected, sclera clear OROPHARYNX:no exudate, no erythema and lips, buccal mucosa, and tongue normal  NECK: supple, thyroid normal size, non-tender, without nodularity LYMPH:  no palpable lymphadenopathy in the cervical, [place], axillary or inguinal LUNGS: clear to auscultation and percussion with normal breathing effort HEART: regular rate & rhythm and no murmurs and no lower extremity edema ABDOMEN:abdomen soft, non-tender and normal bowel sounds Musculoskeletal:no cyanosis of digits and no clubbing (+)weaker right arm  NEURO: alert & oriented x 3 with fluent speech, no focal motor/sensory deficits, he walks independently, gait is normal   LABORATORY DATA:  I have reviewed the data as listed CBC Latest Ref Rng & Units 12/18/2016 11/27/2016 11/10/2016  WBC 4.0 - 10.3 10e3/uL 8.7 6.4 9.5  Hemoglobin 13.0 - 17.1 g/dL 14.9 14.0 14.9  Hematocrit 38.4 - 49.9 % 43.8 41.2 42.9  Platelets 140 - 400 10e3/uL 164 163 197     CMP Latest Ref Rng & Units 12/18/2016 11/27/2016 11/10/2016  Glucose 70 - 140 mg/dl 120 105 88  BUN 7.0 - 26.0 mg/dL 39.6(H) 44.3(H) 40.1(H)  Creatinine 0.7 - 1.3 mg/dL 1.2 1.3 1.2  Sodium 136 - 145 mEq/L 142 142 136  Potassium 3.5 - 5.1 mEq/L 4.8 4.6 4.4  Chloride 101 - 111 mmol/L - - -  CO2 22 - 29 mEq/L _0 Calcium 8.4 - 10.4 mg/dL 9.0 9.6 9.1  Total Protein 6.4 - 8.3 g/dL 6.6 6.4 6.4  Total Bilirubin 0.20 - 1.20 mg/dL 0.42 0.51 0.43  Alkaline Phos 40 - 150 U/L 44 46 58  AST 5 - 34 U/L _1 ALT 0 - 55 U/L 16 19 43    Results for ISAIR, INABINET (MRN [id number]) as of 11/27/2016 13:46  Ref. Range 08/23/2016 10:16 09/06/2016 08:51 09/20/2016 09:51 10/19/2016 09:25  TSH Latest Ref Range: 0.320 -  4.118 m(IU)/L 13.758 (H) 2.376 0.941 5.431 (H)    PATHOLOGY REPORT: Diagnosis 09/21/2015 Lymph node, needle/core biopsy, hypermetabolic left sub clavicular LN - METASTATIC MALIGNANT MELANOMA. - SEE COMMENT.  Diagnosis 10/15/2015 Brain, for tumor resection, Left frontal - METASTATIC MELANOMA.     PROCEDURES: Colonoscopy 05/08/16 - Stool in the ascending colon. Fluid aspiration performed. - Diverticulosis in the entire examined colon. - Non-bleeding internal hemorrhoids. - The examination was otherwise normal on direct and retroflexion views.  RADIOGRAPHIC STUDIES: I have personally reviewed the radiological images as listed and agreed with the findings in the report. No results found.   CT Head with and without contrast 11/27/2016 IMPRESSION: 1. Continued evolution of post radiation changes in the left frontal lobe with further decrease in the area of contrast enhancement and adjacent edema. 2. No new contrast-enhancing lesions.  PET 10/18/16 IMPRESSION: 1. New focal hypermetabolism in the proximal sigmoid colon with associated focal colonic wall thickening and mild pericolonic fat stranding at the site of a proximal sigmoid diverticulum, favored to represent mild acute sigmoid diverticulitis. Metastatic disease is considered less likely. Clinical correlation is necessary. Consider appropriate antibiotic therapy, as clinically warranted. Attention to this region recommended on future PET-CT follow-up. 2. Otherwise complete metabolic response with no evidence of hypermetabolic metastatic disease. Lingular pulmonary nodule is slightly decreased in size and demonstrates no significant metabolism. 3. No residual hypermetabolism in the right thyroid lobe nodule, which is stable in size. 4. Aortic Atherosclerosis (ICD10-I70.0). Stable 3.1 cm infrarenal abdominal aortic aneurysm. Aortic aneurysm NOS (ICD10-I71.9). Recommend followup by ultrasound in 3 years. This  recommendation follows ACR consensus guidelines: White Paper of the ACR Incidental  Findings Committee II on Vascular Findings. J Am Coll Radiol 2013;   CT Head W and WO Contrast 10/05/16 IMPRESSION: 1. Decreased size of the enhancing area within the left frontal lobe treatment site. Surrounding edema has also decreased. The findings support continued evolution of radiation necrosis. 2. No new enhancing lesions.  CT Head 09/02/16 IMPRESSION: Somewhat mixed pattern of slight posterior progression of pleomorphic enhancement surrounding the LEFT lateral ventricle although no significant mass effect, and reduced vasogenic edema compared with the scan in April.  CT Head W WO Contrast Aug 15, 2016 IMPRESSION: Pronounced change in the left frontal region. Much more regional vasogenic edema. Relatively stable appearance of the abnormal enhancement extending from the lateral margin of the frontal horn of left lateral ventricle to the frontal brain surface. New region of abnormal enhancement surrounding the frontal horn of the left lateral ventricle measuring approximately 3.2 cm. The differential diagnosis is recurrent tumor versus radiation necrosis. The patient is reportedly not an MRI candidate. This area is large enough that CT perfusion could possibly make a reasonable differentiation.  PET Image Restage 05/29/16 IMPRESSION: 1. Stable exam with no evidence progression. 2. Photopenia in the LEFT frontal lobe at prior surgical site. 3. Stable mildly hypermetabolic RIGHT paratracheal node. This may represent a thyroid nodule. 4. Stable size of minimally metabolic lingular pulmonary nodule. 5. Stable skeletal lesion of the RIGHT iliac crest 6. No cutaneous metabolic lesion.   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   1. Metastatic melanoma to  lung, node, bone and brain, BRAF V600K mutation (+) -I previously  reviewed his PET/CT scan images with pt and his family members. The scan revealed hypermetabolic left upper lobe lung mass, 2 small right lung nodules, probable bone metastasis, and a subcutaneous left supraclavicular lesion, which is not palpable  -His repeat CT had showed a 2.0 cm left frontal hyperdense lesion with enhancement on contrast, in the area of his recent hemorrhagic stroke, highly suspicious for brain metastasis.  -We previously discussed his left supraclavicular node biopsy results, which showed metastatic melanoma.. -His case was discussed in our sarcoma tumor board. -I previously recommended a first-line treatment with Nivolumab alone, given his recent stroke, low tumor burden. I'll reserve BRAF inhibitor with or without MEK inhibitor as a second line therapy if he progress on immunotherapy. Other options of immunotherapy, especially checkpoint inhibitor with ipilumimab, were also discussed with pt and his daughter -He has been tolerating Nivolumab well, no significant side effects except fatigue. It has been held [place] due to his chronic steroids use -He has had near complete metabolic response on the recent restaging PET scan in July 2018 -We'll continue Del[place] See monthly for his bone metastasis. 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 He has achieved a radiographic complete response.  -He is a very low dose dexamethasone 2 mg daily, I recommend him to restart Nivolumab, I plan to continue Nivolumab for total of 2 years if he remains to be NED on  scan -Lab reviewed and adequate to continue treatment.  -continue slow tapering dexa,  he is currently on 2 mg daily, we'll reduce to 48m on Tuesday, Thursday and Saturday, and 2 mg on the rest of the week -Follow up in 2 weeks.    3. Hemorrhagic stroke and brain metastasis  -I encouraged him to continue physical therapy and occupational therapy. 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 However his clinical neurological function has deteriorated -I have previously discussed with our neuro-oncologist Dr. 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 VMickeal Skinner his radionecrosis has improved, no other new lesions. He was also seen by Dr. VMickeal Skinnertoday who feels he is recovering well from neurological standpoint -I set up taper of his dexamethasone. He is currently on 3 mg daily, I'll reduce to 2 mg daily, until next visit in 3 weeks  -He will reduce dexamethasone 3 days a week he will take 1 pill and on the other 4 days he will take 2 tablets a day.    4. 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 VMickeal Skinnerto discuss treatment options to improve his neurologic function, patient is very frustrated about this, which significantly impacting his quality of life. -continue PT/OT  -CT Head  11/27/2016 showed continued evolution of post radiation changes in the left frontal lobe with further decrease in the area of contrast enhancement and adjacent edema. No new contrast-enhancing lesions.  -The patient previously reviewed these results with neuro-oncologist, Dr. VMickeal Skinner  5.  Hypercalcemia and AKI  -he was found to have calcium 13.4 today and Cr 2.0 on his office visit 11/01/2016 -Possibly related to his underlying malignancy (bone mets) and missing one dose of Xgeva  -He has restarted Xgeva monthly. Calcium has been normal   6. Fatigue, hypothyroidism and hypotestosteronemia -He has baseline hypothyroidism and hypotestosteronemia -His fatigue has much improved since he restarted testosterone injection by his primary care physician -He will follow-up with his primary care physician Dr. RAsher Muirfor his Synthroid dose adjustment  -His TSH was slightly In November 2017. TSH 9.6 on 06/15/16. - TSH 25.01 on 08/10/16. TSH 5.431 on 10/19/16. -The patient will continue with light exercise as he is able. -I again encouraged him to be active -I encouraged him to continue outpt physical therapy  7. Anemia and history of GI bleeding  -his anemia has resolved now -I previously encouraged him to continue oral iron supplement for a total of 3 months   8. HTN -he is on amlodipine and carvedilol, follow-up of his primary care physician -Due to drug interaction with new oral chemo he will need to monitor his BP more often.   9. Depression  -he is on zoloft 549mdaily, management by his PCP  -improved overall   10. CKD stage III -His Cr was previously slightly elevated -I previously encouraged him to drink water adequately -avoid NSAIDs  11. Goal of care discussion  -We previously discussed the incurable nature of his metastatic melanoma, and the overall poor prognosis, especially if he does not have good response to chemotherapy or progress on chemo -The patient understands the goal of care  is palliative. -I recommend DNR/DNI, he will think about it   12. CAD -history of 2 double vessel bypass -He has pace maker -per pt (her daughter) request, will switch his cardiologist to Dr. Haroldine Laws   Plan -Restart Nivolamab today, and continue every 2 weeks -reduce dexamethasone 31m daily for 4 days and 144mdaily 3 days (Tuesday, Thursday and Saturday).  -Lab and f/u in 2 weeks     All questions were answered. The patient knows to call the clinic with any problems, questions or concerns. No barriers to learning was detected.  I spent 25 minutes counseling the patient face to face. The total time spent in the appointment was 30 minutes and more than 50% was on counseling and review of test results.  This document serves as a record of services personally performed by YaTruitt MerleMD. It was created on her behalf by AmJoslyn Devona trained medical scribe. The creation of this record is based on the scribe's personal observations and the provider's statements to them. This document has been checked and approved by the attending provider.   FeTruitt MerleMD  12/18/16

## 2016-12-15 DIAGNOSIS — R4189 Other symptoms and signs involving cognitive functions and awareness: Secondary | ICD-10-CM | POA: Diagnosis not present

## 2016-12-18 ENCOUNTER — Telehealth: Payer: Self-pay

## 2016-12-18 ENCOUNTER — Ambulatory Visit (HOSPITAL_BASED_OUTPATIENT_CLINIC_OR_DEPARTMENT_OTHER): Payer: Medicare Other | Admitting: Hematology

## 2016-12-18 ENCOUNTER — Ambulatory Visit (HOSPITAL_BASED_OUTPATIENT_CLINIC_OR_DEPARTMENT_OTHER): Payer: Medicare Other

## 2016-12-18 ENCOUNTER — Other Ambulatory Visit (HOSPITAL_BASED_OUTPATIENT_CLINIC_OR_DEPARTMENT_OTHER): Payer: Medicare Other

## 2016-12-18 VITALS — BP 120/83 | HR 76 | Temp 97.8°F | Resp 18 | Ht 69.5 in | Wt 207.9 lb

## 2016-12-18 DIAGNOSIS — I10 Essential (primary) hypertension: Secondary | ICD-10-CM

## 2016-12-18 DIAGNOSIS — C770 Secondary and unspecified malignant neoplasm of lymph nodes of head, face and neck: Secondary | ICD-10-CM

## 2016-12-18 DIAGNOSIS — C439 Malignant melanoma of skin, unspecified: Secondary | ICD-10-CM

## 2016-12-18 DIAGNOSIS — C7951 Secondary malignant neoplasm of bone: Secondary | ICD-10-CM | POA: Diagnosis not present

## 2016-12-18 DIAGNOSIS — I619 Nontraumatic intracerebral hemorrhage, unspecified: Secondary | ICD-10-CM

## 2016-12-18 DIAGNOSIS — I2510 Atherosclerotic heart disease of native coronary artery without angina pectoris: Secondary | ICD-10-CM

## 2016-12-18 DIAGNOSIS — C7800 Secondary malignant neoplasm of unspecified lung: Secondary | ICD-10-CM

## 2016-12-18 DIAGNOSIS — N179 Acute kidney failure, unspecified: Secondary | ICD-10-CM

## 2016-12-18 DIAGNOSIS — E039 Hypothyroidism, unspecified: Secondary | ICD-10-CM | POA: Diagnosis not present

## 2016-12-18 DIAGNOSIS — E291 Testicular hypofunction: Secondary | ICD-10-CM | POA: Diagnosis not present

## 2016-12-18 DIAGNOSIS — C7931 Secondary malignant neoplasm of brain: Secondary | ICD-10-CM

## 2016-12-18 DIAGNOSIS — R5383 Other fatigue: Secondary | ICD-10-CM

## 2016-12-18 DIAGNOSIS — F329 Major depressive disorder, single episode, unspecified: Secondary | ICD-10-CM

## 2016-12-18 DIAGNOSIS — N183 Chronic kidney disease, stage 3 (moderate): Secondary | ICD-10-CM

## 2016-12-18 DIAGNOSIS — C799 Secondary malignant neoplasm of unspecified site: Secondary | ICD-10-CM

## 2016-12-18 DIAGNOSIS — R413 Other amnesia: Secondary | ICD-10-CM | POA: Diagnosis not present

## 2016-12-18 DIAGNOSIS — Z5112 Encounter for antineoplastic immunotherapy: Secondary | ICD-10-CM | POA: Diagnosis present

## 2016-12-18 DIAGNOSIS — I69320 Aphasia following cerebral infarction: Secondary | ICD-10-CM

## 2016-12-18 LAB — COMPREHENSIVE METABOLIC PANEL
ALBUMIN: 3.4 g/dL — AB (ref 3.5–5.0)
ALK PHOS: 44 U/L (ref 40–150)
ALT: 16 U/L (ref 0–55)
AST: 14 U/L (ref 5–34)
Anion Gap: 8 mEq/L (ref 3–11)
BILIRUBIN TOTAL: 0.42 mg/dL (ref 0.20–1.20)
BUN: 39.6 mg/dL — AB (ref 7.0–26.0)
CO2: 22 meq/L (ref 22–29)
CREATININE: 1.2 mg/dL (ref 0.7–1.3)
Calcium: 9 mg/dL (ref 8.4–10.4)
Chloride: 111 mEq/L — ABNORMAL HIGH (ref 98–109)
EGFR: 56 mL/min/{1.73_m2} — AB (ref >90)
GLUCOSE: 120 mg/dL (ref 70–140)
Potassium: 4.8 mEq/L (ref 3.5–5.1)
SODIUM: 142 meq/L (ref 136–145)
TOTAL PROTEIN: 6.6 g/dL (ref 6.4–8.3)

## 2016-12-18 LAB — CBC WITH DIFFERENTIAL/PLATELET
BASO%: 0.1 % (ref 0.0–2.0)
Basophils Absolute: 0 10*3/uL (ref 0.0–0.1)
EOS%: 0.6 % (ref 0.0–7.0)
Eosinophils Absolute: 0.1 10*3/uL (ref 0.0–0.5)
HCT: 43.8 % (ref 38.4–49.9)
HGB: 14.9 g/dL (ref 13.0–17.1)
LYMPH%: 4.7 % — AB (ref 14.0–49.0)
MCH: 33 pg (ref 27.2–33.4)
MCHC: 34 g/dL (ref 32.0–36.0)
MCV: 97.1 fL (ref 79.3–98.0)
MONO#: 0.5 10*3/uL (ref 0.1–0.9)
MONO%: 5.8 % (ref 0.0–14.0)
NEUT#: 7.8 10*3/uL — ABNORMAL HIGH (ref 1.5–6.5)
NEUT%: 88.8 % — AB (ref 39.0–75.0)
Platelets: 164 10*3/uL (ref 140–400)
RBC: 4.51 10*6/uL (ref 4.20–5.82)
RDW: 13.7 % (ref 11.0–14.6)
WBC: 8.7 10*3/uL (ref 4.0–10.3)
lymph#: 0.4 10*3/uL — ABNORMAL LOW (ref 0.9–3.3)

## 2016-12-18 LAB — MAGNESIUM: Magnesium: 2.5 mg/dl (ref 1.5–2.5)

## 2016-12-18 MED ORDER — SODIUM CHLORIDE 0.9 % IV SOLN
240.0000 mg | Freq: Once | INTRAVENOUS | Status: AC
  Administered 2016-12-18: 240 mg via INTRAVENOUS
  Filled 2016-12-18: qty 24

## 2016-12-18 MED ORDER — DENOSUMAB 120 MG/1.7ML ~~LOC~~ SOLN
120.0000 mg | Freq: Once | SUBCUTANEOUS | Status: AC
  Administered 2016-12-18: 120 mg via SUBCUTANEOUS
  Filled 2016-12-18: qty 1.7

## 2016-12-18 MED ORDER — SODIUM CHLORIDE 0.9 % IV SOLN
Freq: Once | INTRAVENOUS | Status: AC
  Administered 2016-12-18: 16:00:00 via INTRAVENOUS

## 2016-12-18 NOTE — Telephone Encounter (Signed)
Gave patient avs and calender for September and October per 9/10 los

## 2016-12-18 NOTE — Patient Instructions (Signed)
[facility] Discharge Instructions for Patients Receiving Chemotherapy  Today you received the following chemotherapy agents Opdivo.  To help prevent nausea and vomiting after your treatment, we encourage you to take your nausea medication as directed.   If you develop nausea and vomiting that is not controlled by your nausea medication, call the clinic.   BELOW ARE SYMPTOMS THAT SHOULD BE REPORTED IMMEDIATELY:  *FEVER GREATER THAN 100.5 F  *CHILLS WITH OR WITHOUT FEVER  NAUSEA AND VOMITING THAT IS NOT CONTROLLED WITH YOUR NAUSEA MEDICATION  *UNUSUAL SHORTNESS OF BREATH  *UNUSUAL BRUISING OR BLEEDING  TENDERNESS IN MOUTH AND THROAT WITH OR WITHOUT PRESENCE OF ULCERS  *URINARY PROBLEMS  *BOWEL PROBLEMS  UNUSUAL RASH Items with * indicate a potential emergency and should be followed up as soon as possible.  Feel free to call the clinic you have any questions or concerns. The clinic phone number is [phone].  Please show the CHEMO ALERT CARD at check-in to the Emergency Department and triage nurse.    

## 2016-12-19 ENCOUNTER — Encounter: Payer: Self-pay | Admitting: Hematology

## 2017-01-01 ENCOUNTER — Ambulatory Visit (HOSPITAL_BASED_OUTPATIENT_CLINIC_OR_DEPARTMENT_OTHER): Payer: Medicare Other | Admitting: Hematology

## 2017-01-01 ENCOUNTER — Other Ambulatory Visit (HOSPITAL_BASED_OUTPATIENT_CLINIC_OR_DEPARTMENT_OTHER): Payer: Medicare Other

## 2017-01-01 ENCOUNTER — Ambulatory Visit (HOSPITAL_BASED_OUTPATIENT_CLINIC_OR_DEPARTMENT_OTHER): Payer: Medicare Other

## 2017-01-01 ENCOUNTER — Encounter: Payer: Self-pay | Admitting: Hematology

## 2017-01-01 VITALS — BP 125/83 | HR 80 | Temp 97.9°F | Resp 17 | Ht 69.5 in | Wt 206.8 lb

## 2017-01-01 DIAGNOSIS — E039 Hypothyroidism, unspecified: Secondary | ICD-10-CM | POA: Diagnosis not present

## 2017-01-01 DIAGNOSIS — N186 End stage renal disease: Secondary | ICD-10-CM | POA: Diagnosis not present

## 2017-01-01 DIAGNOSIS — I2510 Atherosclerotic heart disease of native coronary artery without angina pectoris: Secondary | ICD-10-CM | POA: Diagnosis not present

## 2017-01-01 DIAGNOSIS — C7951 Secondary malignant neoplasm of bone: Secondary | ICD-10-CM

## 2017-01-01 DIAGNOSIS — N179 Acute kidney failure, unspecified: Secondary | ICD-10-CM | POA: Diagnosis not present

## 2017-01-01 DIAGNOSIS — R5383 Other fatigue: Secondary | ICD-10-CM

## 2017-01-01 DIAGNOSIS — I10 Essential (primary) hypertension: Secondary | ICD-10-CM

## 2017-01-01 DIAGNOSIS — R413 Other amnesia: Secondary | ICD-10-CM | POA: Diagnosis not present

## 2017-01-01 DIAGNOSIS — I69320 Aphasia following cerebral infarction: Secondary | ICD-10-CM

## 2017-01-01 DIAGNOSIS — C7800 Secondary malignant neoplasm of unspecified lung: Secondary | ICD-10-CM

## 2017-01-01 DIAGNOSIS — C439 Malignant melanoma of skin, unspecified: Secondary | ICD-10-CM

## 2017-01-01 DIAGNOSIS — C770 Secondary and unspecified malignant neoplasm of lymph nodes of head, face and neck: Secondary | ICD-10-CM | POA: Diagnosis not present

## 2017-01-01 DIAGNOSIS — E291 Testicular hypofunction: Secondary | ICD-10-CM | POA: Diagnosis not present

## 2017-01-01 DIAGNOSIS — C7931 Secondary malignant neoplasm of brain: Secondary | ICD-10-CM | POA: Diagnosis not present

## 2017-01-01 DIAGNOSIS — C799 Secondary malignant neoplasm of unspecified site: Secondary | ICD-10-CM

## 2017-01-01 DIAGNOSIS — I619 Nontraumatic intracerebral hemorrhage, unspecified: Secondary | ICD-10-CM

## 2017-01-01 DIAGNOSIS — Z5112 Encounter for antineoplastic immunotherapy: Secondary | ICD-10-CM

## 2017-01-01 DIAGNOSIS — F329 Major depressive disorder, single episode, unspecified: Secondary | ICD-10-CM | POA: Diagnosis not present

## 2017-01-01 LAB — COMPREHENSIVE METABOLIC PANEL
ALBUMIN: 3.6 g/dL (ref 3.5–5.0)
ALK PHOS: 43 U/L (ref 40–150)
ALT: 17 U/L (ref 0–55)
ANION GAP: 7 meq/L (ref 3–11)
AST: 17 U/L (ref 5–34)
BILIRUBIN TOTAL: 0.64 mg/dL (ref 0.20–1.20)
BUN: 39.2 mg/dL — ABNORMAL HIGH (ref 7.0–26.0)
CALCIUM: 9.7 mg/dL (ref 8.4–10.4)
CO2: 30 mEq/L — ABNORMAL HIGH (ref 22–29)
Chloride: 106 mEq/L (ref 98–109)
Creatinine: 1.4 mg/dL — ABNORMAL HIGH (ref 0.7–1.3)
EGFR: 50 mL/min/{1.73_m2} — AB (ref >90)
GLUCOSE: 146 mg/dL — AB (ref 70–140)
POTASSIUM: 4.1 meq/L (ref 3.5–5.1)
Sodium: 143 mEq/L (ref 136–145)
TOTAL PROTEIN: 6.9 g/dL (ref 6.4–8.3)

## 2017-01-01 LAB — CBC WITH DIFFERENTIAL/PLATELET
BASO%: 0.2 % (ref 0.0–2.0)
BASOS ABS: 0 10*3/uL (ref 0.0–0.1)
EOS ABS: 0.1 10*3/uL (ref 0.0–0.5)
EOS%: 2.7 % (ref 0.0–7.0)
HEMATOCRIT: 44.7 % (ref 38.4–49.9)
HEMOGLOBIN: 15 g/dL (ref 13.0–17.1)
LYMPH%: 10.7 % — ABNORMAL LOW (ref 14.0–49.0)
MCH: 32.6 pg (ref 27.2–33.4)
MCHC: 33.6 g/dL (ref 32.0–36.0)
MCV: 97.2 fL (ref 79.3–98.0)
MONO#: 0.4 10*3/uL (ref 0.1–0.9)
MONO%: 8.2 % (ref 0.0–14.0)
NEUT%: 78.2 % — ABNORMAL HIGH (ref 39.0–75.0)
NEUTROS ABS: 4 10*3/uL (ref 1.5–6.5)
PLATELETS: 140 10*3/uL (ref 140–400)
RBC: 4.6 10*6/uL (ref 4.20–5.82)
RDW: 13.4 % (ref 11.0–14.6)
WBC: 5.1 10*3/uL (ref 4.0–10.3)
lymph#: 0.6 10*3/uL — ABNORMAL LOW (ref 0.9–3.3)

## 2017-01-01 MED ORDER — SODIUM CHLORIDE 0.9 % IV SOLN
Freq: Once | INTRAVENOUS | Status: AC
  Administered 2017-01-01: 15:00:00 via INTRAVENOUS

## 2017-01-01 MED ORDER — SODIUM CHLORIDE 0.9 % IV SOLN
240.0000 mg | Freq: Once | INTRAVENOUS | Status: AC
  Administered 2017-01-01: 240 mg via INTRAVENOUS
  Filled 2017-01-01: qty 24

## 2017-01-01 NOTE — Patient Instructions (Signed)
[facility] Discharge Instructions for Patients Receiving Chemotherapy  Today you received the following chemotherapy agents :  Opdivo.  To help prevent nausea and vomiting after your treatment, we encourage you to take your nausea medication as prescribed.   If you develop nausea and vomiting that is not controlled by your nausea medication, call the clinic.   BELOW ARE SYMPTOMS THAT SHOULD BE REPORTED IMMEDIATELY:  *FEVER GREATER THAN 100.5 F  *CHILLS WITH OR WITHOUT FEVER  NAUSEA AND VOMITING THAT IS NOT CONTROLLED WITH YOUR NAUSEA MEDICATION  *UNUSUAL SHORTNESS OF BREATH  *UNUSUAL BRUISING OR BLEEDING  TENDERNESS IN MOUTH AND THROAT WITH OR WITHOUT PRESENCE OF ULCERS  *URINARY PROBLEMS  *BOWEL PROBLEMS  UNUSUAL RASH Items with * indicate a potential emergency and should be followed up as soon as possible.  Feel free to call the clinic should you have any questions or concerns. The clinic phone number is [phone].  Please show the CHEMO ALERT CARD at check-in to the Emergency Department and triage nurse.   

## 2017-01-01 NOTE — Progress Notes (Signed)
[place]  Telephone:([phone] Fax:(336) [phone]  Clinic Follow up Note   Patient Care Team: [name], MD as PCP - General (Internal Medicine) 01/01/2017  Chief complaint: Follow-up metastatic melanoma  Oncology History   Metastatic melanoma [facility])   Staging form: Melanoma of the Skin, AJCC 7th Edition     Clinical stage from 09/21/2015: Stage IV [place], [place], [place]) - Signed by [name], MD on 10/09/2015       Metastatic melanoma ([place])   09/16/2015 Imaging    PET scan showed a hypermetabolic 8.0DX lingular nodule, and a 53m right upper lobe lung nodule, left subclavicular nodes, and bone metastasis in the proximal right humerus and right femur.       09/21/2015 Initial Diagnosis    [place]astatic [place]      09/21/2015 Initial Biopsy    Left subclavicular lymph node biopsy showed prostatic of melanoma      09/21/2015 Miscellaneous    BRAF V600K mutation (+)       10/04/2015 Imaging    CT head with without contrast showed a 2.0 x 1.6 x 1.7 cm superficial left frontal hyperdense lesion, with postcontrast enhancement, lying within the previous hemorrhagic area, concerning for solitary melanoma metastasis      10/06/2015 - 09/06/2016 Chemotherapy    Nivolumab [id number] 2 weeks, held due to his dexamethasone for radionecrosis       10/13/2015 - 10/13/2015 Radiation Therapy    10/13/2015 Preop SRS treatment: 14 Gy  to the left frontal lesion in 1 fraction      10/15/2015 Surgery    left front craniotomy and tumor excision       10/15/2015 Pathology Results    left frontal brain mass excision showed metastatic melanoma, 1.5X1.5X0.6cm      01/13/2016 Imaging    CT head without and with contrast showed ill-defined enhancement and dural thickening of the left frontal resection cavity may represent a post treatment changes or residual neoplasm. A new 6 mm hypodensity in the right cerebellar hemisphere with uncertainty enhancement may represent a metastasis or  small brain parenchymal hemorrhage.       01/27/2016 - 01/27/2016 Radiation Therapy    01/27/16 SRS Treatment:  20 Gy in 1 fraction to a 6 mm right cerebellar brain met       03/29/2016 PET scan    PET 03/29/2016 IMPRESSION: 1. Overall there has been an interval response to therapy. The index lesions sided on previous exam it are less hypermetabolic when compared with the previous exam. 2. There is an intramuscular lesion within the right masseter which has increased FDG uptake compared with previous exam. 3. No new sites of disease identified. 4. Aortic atherosclerosis and infrarenal abdominal aortic aneurysm. Recommend followup by ultrasound in 3 years. This recommendation follows ACR consensus guidelines: White Paper of the ACR Incidental Findings Committee II on Vascular Findings. J Joellyn Ruedadiol 2013; 10:789-794      04/20/2016 Imaging    CT Head w wo contrast 1. Mild patchy enhancement at the left anterior frontal lobe treatment site (series 11, image 39) without mass effect and stable surrounding white matter hypodensity without strong evidence of acute vasogenic edema. Recommend continued CT surveillance. 2. No new metastatic disease or new intracranial abnormality identified. 3. Probable benign 14 mm left parietal bone lucent lesion, unchanged since May 2017.      05/06/2016 - 05/08/2016 Hospital Admission    Pt was admitted for rectal bleeding for one day. Breathing spontaneously  resolved, hemoglobin dropped from 15 to 10.5, did not require blood transfusion. Colonoscopy showed no active bleeding, except diverticulosis and hemorrhoid. He was discharged to home.      05/08/2016 Procedure    Colonoscopy  - Stool in the ascending colon. Fluid aspiration performed. - Diverticulosis in the entire examined colon. - Non-bleeding internal hemorrhoids. - The examination was otherwise normal on direct and retroflexion views.      05/29/2016 Imaging    PET Image  Restaging IMPRESSION: 1. Stable exam with no evidence progression. 2. Photopenia in the LEFT frontal lobe at prior surgical site. 3. Stable mildly hypermetabolic RIGHT paratracheal node. This may represent a thyroid nodule. 4. Stable size of minimally metabolic lingular pulmonary nodule. 5. Stable skeletal lesion of the RIGHT iliac crest 6. No cutaneous metabolic lesion.      07/20/2016 Imaging    CT Head W WO Contrast IMPRESSION: Pronounced change in the left frontal region. Much more regional vasogenic edema. Relatively stable appearance of the abnormal enhancement extending from the lateral margin of the frontal horn of left lateral ventricle to the frontal brain surface. New region of abnormal enhancement surrounding the frontal horn of the left lateral ventricle measuring approximately 3.2 cm. The differential diagnosis is recurrent tumor versus radiation necrosis. The patient is reportedly not an MRI candidate. This area is large enough that CT perfusion could possibly make a reasonable differentiation.      09/02/2016 Imaging    CT Head 09/02/16 IMPRESSION: Somewhat mixed pattern of slight posterior progression of pleomorphic enhancement surrounding the LEFT lateral ventricle although no significant mass effect, and reduced vasogenic edema compared with the scan in April.      09/27/2016 - 11/02/2016 Chemotherapy    Start dabrafenib and trametinib 09/27/16, stopped due to severe fatigue        10/05/2016 Imaging    CT Head w & w/o contrast  IMPRESSION: 1. Decreased size of the enhancing area within the left frontal lobe treatment site. Surrounding edema has also decreased. The findings support continued evolution of radiation necrosis. 2. No new enhancing lesions.      10/18/2016 PET scan    IMPRESSION: 1. New focal hypermetabolism in the proximal sigmoid colon with associated focal colonic wall thickening and mild pericolonic fat stranding at the site of a proximal  sigmoid diverticulum, favored to represent mild acute sigmoid diverticulitis. Metastatic disease is considered less likely. Clinical correlation is necessary. Consider appropriate antibiotic therapy, as clinically warranted. Attention to this region recommended on future PET-CT follow-up. 2. Otherwise complete metabolic response with no evidence of hypermetabolic metastatic disease. Lingular pulmonary nodule is slightly decreased in size and demonstrates no significant metabolism. 3. No residual hypermetabolism in the right thyroid lobe nodule, which is stable in size. 4. Aortic Atherosclerosis (ICD10-I70.0). Stable 3.1 cm infrarenal abdominal aortic aneurysm. Aortic aneurysm NOS (ICD10-I71.9). Recommend followup by ultrasound in 3 years. This recommendation follows ACR consensus guidelines: White Paper of the ACR Incidental Findings Committee II on Vascular Findings. Joellyn Rued Radiol 2013; 83:662-947.      11/01/2016 - 11/04/2016 Hospital Admission    Patient admitted to the hospital due to hypercalcemia where he received IV fluids and pamidronate      12/13/2016 PET scan    PET 12/13/16 IMPRESSION: 1. No findings to suggest metastatic disease on today's examination. 2. Previously noted hypermetabolism in the sigmoid colon has resolved, presumably indicative of a focus of diverticulitis on the prior study. Today's study does demonstrate relatively diffuse hypermetabolism throughout the  cecum, ascending colon and proximal transverse colon where there is some mild mural thickening, favored to reflect mild chronic inflammation related to underlying diverticular disease. No definite inflammatory changes on the CT portion of the examination to strongly suggest an active acute diverticulitis at this time. No discrete lesion to suggest neoplasm. 3. Previously noted 8 mm lingular nodule is stable in size and known remains non hypermetabolic. This is nonspecific but reassuring. Continued  attention on future followup studies is recommended. 4. Aortic atherosclerosis, in addition to left main and 3 vessel coronary artery disease. Status post median sternotomy for CABG including LIMA to the LAD. 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 He was found to have left frontal hemorrhage 5.7 cm x 4.5 cm with 6 mm left to right shift.During the workup for his stroke, a left upper lung mass was noticed on the CT scan. I was called to evaluate his lung mass.  Patient has aphasia, not able to communicate much. According to his daughter, David [name], who is a OB GYN at [employer], he probably only smoked for very short period time during the college. He did have 3 skin melanoma, which was removed, last one was 2-3 years ago. No other cancer history.  CURRENT THERAPY:   1.Supportive care and dexamethasone, tapering dose 2. restarted nivolamab on 12/18/2016, every 2 weeks.    INTERVAL HISTORY: David [name] returns for follow-up alone. He developed moderate fatigue after last treatment, which probably was a combination of Nivolumab and tapering dose steroids. He has felt much better since a week ago, with good energy and appetite. Denies any other new symptoms, still has memory issue, which is stable. He walks independently without difficulty. He wants to know if he can drive again.   REVIEW OF SYSTEMS:  Constitutional: Denies fevers, chills or abnormal weight loss Eyes: Denies blurriness of vision Ears, nose, mouth, throat, and face: Denies mucositis or sore throat Respiratory: Denies cough, dyspnea or wheezes Cardiovascular: Denies palpitation, chest discomfort or lower extremity  swelling Gastrointestinal:  Denies nausea, heartburn, hematochezia  Skin: Negative. Lymphatics: Denies new lymphadenopathy or easy bruising Neurological:see HPI  Behavioral/Psych: Mood is stable, no new changes (+) trouble processing short and long term memory All other systems were reviewed with the patient and are negative.  MEDICAL HISTORY:  Past Medical History:  Diagnosis Date  . Anginal pain ([place])   . Arthritis    mostly hands  . Benign familial hematuria   . BPH (benign prostatic hypertrophy)   . Brain cancer ([place])    melanoma with brain met  . Coronary artery disease   . GERD (gastroesophageal reflux disease)   . High cholesterol   . Hypertension   . Hypothyroidism   . Intracerebral hemorrhage ([place])   . Melanoma ([place])   . Metastatic melanoma to lung ([place])   . Mood disorder ([place])   . Myocardial infarction ([place])   . Pacemaker    due to syncope, 3rd degree HB (upgrade to [facility]. Jude CRT-P 02/25/13 (Dr. Uvaldo Rising)  . Rectal bleed    due to NSAIDS  . Renal disorder   . Skin cancer    melanoma  . Sleep apnea  uses CPAP  . Stroke [facility])     SURGICAL HISTORY: Past Surgical History:  Procedure Laterality Date  . APPLICATION OF CRANIAL NAVIGATION N/A 10/15/2015   Procedure: APPLICATION OF CRANIAL NAVIGATION;  Surgeon: Erline Levine, MD;  Location: [place] NEURO ORS;  Service: Neurosurgery;  Laterality: N/A;  . CARDIAC CATHETERIZATION  2013  . CARDIAC SURGERY     bypass X 2  . COLONOSCOPY WITH PROPOFOL N/A 05/08/2016   Procedure: COLONOSCOPY WITH PROPOFOL;  Surgeon: Jonathon Bellows, MD;  Location: [facility] ENDOSCOPY;  Service: Gastroenterology;  Laterality: N/A;  . CORONARY ARTERY BYPASS GRAFT  2013   LIMA-LAD, SVG-PDA 03/27/11 (Dr. Francee Gentile)  . CRANIOTOMY N/A 10/15/2015   Procedure: LEFT FRONTAL CRANIOTOMY TUMOR EXCISION with Curve;  Surgeon: Erline Levine, MD;  Location: [place] NEURO ORS;  Service: Neurosurgery;  Laterality: N/A;  CRANIOTOMY TUMOR EXCISION  . EYE SURGERY    . FOOT SURGERY   08/2010  . JOINT REPLACEMENT Left    partial knee  . KNEE ARTHROSCOPY  12/2008  . LYMPH NODE BIOPSY    . PACEMAKER INSERTION    . TONSILLECTOMY      I have reviewed the social history and family history with the patient and they are unchanged from previous note.  ALLERGIES:  is allergic to zocor [simvastatin].  MEDICATIONS:  Current Outpatient Prescriptions  Medication Sig Dispense Refill  . aspirin EC 81 MG tablet Take 81 mg by mouth daily.    . carvedilol (COREG) 6.25 MG tablet Take 6.25 mg by mouth 2 (two) times daily with a meal.    . dexamethasone (DECADRON) 1 MG tablet Take 2 tablets (2 mg total) by mouth daily with breakfast. 60 tablet 0  . levothyroxine (SYNTHROID, LEVOTHROID) 175 MCG tablet Take 175 mcg by mouth daily before breakfast.    . pentoxifylline (TRENTAL) 400 MG CR tablet Take 1 tablet (400 mg total) by mouth 2 (two) times daily. 60 tablet 6  . rosuvastatin (CRESTOR) 10 MG tablet Take 10 mg by mouth at bedtime.    . sertraline (ZOLOFT) 50 MG tablet Take 100 mg by mouth daily.     Marland Kitchen testosterone cypionate (DEPOTESTOTERONE CYPIONATE) 100 MG/ML injection Inject 100 mg into the muscle every 14 (fourteen) days.     . vitamin E (VITAMIN E) 400 UNIT capsule Take 1 capsule (400 Units total) by mouth 2 (two) times daily. 60 capsule 6  . feeding supplement, ENSURE ENLIVE, (ENSURE ENLIVE) LIQD Take 237 mLs by mouth 2 (two) times daily between meals. (Patient not taking: Reported on 01/01/2017)     No current facility-administered medications for this visit.    Facility-Administered Medications Ordered in Other Visits  Medication Dose Route Frequency Provider Last Rate Last Dose  . 0.9 %  sodium chloride infusion  1,000 mL Intravenous Once [name], MD        PHYSICAL EXAMINATION:  ECOG PERFORMANCE STATUS: 1 BP 125/83 (BP Location: Right Arm, Patient Position: Sitting)   Pulse 80   Temp 97.9 F (36.6 C) (Oral)   Resp 17   Ht 5' 9.5" (1.765 m)   Wt 206 lb 12.8 oz (93.8  kg)   SpO2 97%   BMI 30.10 kg/m    GENERAL:alert, no distress and comfortable SKIN: skin color, texture, turgor are normal, (+) diffuse papular skin rash on bilateral forearms and hand, improved EYES: normal, Conjunctiva are pink and non-injected, sclera clear OROPHARYNX:no exudate, no erythema and lips, buccal mucosa, and tongue normal  NECK: supple, thyroid normal size, non-tender, without  nodularity LYMPH:  no palpable lymphadenopathy in the cervical, [place], axillary or inguinal LUNGS: clear to auscultation and percussion with normal breathing effort HEART: regular rate & rhythm and no murmurs and no lower extremity edema ABDOMEN:abdomen soft, non-tender and normal bowel sounds Musculoskeletal:no cyanosis of digits and no clubbing (+)weaker right arm  NEURO: alert & oriented x 3 with fluent speech, no focal motor/sensory deficits, he walks independently, gait is normal   LABORATORY DATA:  I have reviewed the data as listed CBC Latest Ref Rng & Units 01/01/2017 12/18/2016 11/27/2016  WBC 4.0 - 10.3 10e3/uL 5.1 8.7 6.4  Hemoglobin 13.0 - 17.1 g/dL 15.0 14.9 14.0  Hematocrit 38.4 - 49.9 % 44.7 43.8 41.2  Platelets 140 - 400 10e3/uL 140 164 163     CMP Latest Ref Rng & Units 01/01/2017 12/18/2016 11/27/2016  Glucose 70 - 140 mg/dl 146(H) 120 105  BUN 7.0 - 26.0 mg/dL 39.2(H) 39.6(H) 44.3(H)  Creatinine 0.7 - 1.3 mg/dL 1.4(H) 1.2 1.3  Sodium 136 - 145 mEq/L 143 142 142  Potassium 3.5 - 5.1 mEq/L 4.1 4.8 4.6  Chloride 101 - 111 mmol/L - - -  CO2 22 - 29 mEq/L 30(H) 22 28  Calcium 8.4 - 10.4 mg/dL 9.7 9.0 9.6  Total Protein 6.4 - 8.3 g/dL 6.9 6.6 6.4  Total Bilirubin 0.20 - 1.20 mg/dL 0.64 0.42 0.51  Alkaline Phos 40 - 150 U/L 43 44 46  AST 5 - 34 U/L _0 ALT 0 - 55 U/L _1 Results for CARDIN, NITSCHKE (MRN [id number]) as of 11/27/2016 13:46  Ref. Range 08/23/2016 10:16 09/06/2016 08:51 09/20/2016 09:51 10/19/2016 09:25  TSH Latest Ref Range: 0.320 - 4.118 m(IU)/L 13.758 (H)  2.376 0.941 5.431 (H)    PATHOLOGY REPORT: Diagnosis 09/21/2015 Lymph node, needle/core biopsy, hypermetabolic left sub clavicular LN - METASTATIC MALIGNANT MELANOMA. - SEE COMMENT.  Diagnosis 10/15/2015 Brain, for tumor resection, Left frontal - METASTATIC MELANOMA.     PROCEDURES: Colonoscopy 05/08/16 - Stool in the ascending colon. Fluid aspiration performed. - Diverticulosis in the entire examined colon. - Non-bleeding internal hemorrhoids. - The examination was otherwise normal on direct and retroflexion views.  RADIOGRAPHIC STUDIES: I have personally reviewed the radiological images as listed and agreed with the findings in the report. No results found.   CT Head with and without contrast 11/27/2016 IMPRESSION: 1. Continued evolution of post radiation changes in the left frontal lobe with further decrease in the area of contrast enhancement and adjacent edema. 2. No new contrast-enhancing lesions.  PET 10/18/16 IMPRESSION: 1. New focal hypermetabolism in the proximal sigmoid colon with associated focal colonic wall thickening and mild pericolonic fat stranding at the site of a proximal sigmoid diverticulum, favored to represent mild acute sigmoid diverticulitis. Metastatic disease is considered less likely. Clinical correlation is necessary. Consider appropriate antibiotic therapy, as clinically warranted. Attention to this region recommended on future PET-CT follow-up. 2. Otherwise complete metabolic response with no evidence of hypermetabolic metastatic disease. Lingular pulmonary nodule is slightly decreased in size and demonstrates no significant metabolism. 3. No residual hypermetabolism in the right thyroid lobe nodule, which is stable in size. 4. Aortic Atherosclerosis (ICD10-I70.0). Stable 3.1 cm infrarenal abdominal aortic aneurysm. Aortic aneurysm NOS (ICD10-I71.9). Recommend followup by ultrasound in 3 years. This recommendation follows ACR consensus  guidelines: White Paper of the ACR Incidental Findings Committee II on Vascular Findings. J Am Coll Radiol 2013;   CT Head W and WO Contrast 10/05/16 IMPRESSION: 1.  Decreased size of the enhancing area within the left frontal lobe treatment site. Surrounding edema has also decreased. The findings support continued evolution of radiation necrosis. 2. No new enhancing lesions.  CT Head 09/02/16 IMPRESSION: Somewhat mixed pattern of slight posterior progression of pleomorphic enhancement surrounding the LEFT lateral ventricle although no significant mass effect, and reduced vasogenic edema compared with the scan in April.  CT Head W WO Contrast 08-01-2016 IMPRESSION: Pronounced change in the left frontal region. Much more regional vasogenic edema. Relatively stable appearance of the abnormal enhancement extending from the lateral margin of the frontal horn of left lateral ventricle to the frontal brain surface. New region of abnormal enhancement surrounding the frontal horn of the left lateral ventricle measuring approximately 3.2 cm. The differential diagnosis is recurrent tumor versus radiation necrosis. The patient is reportedly not an MRI candidate. This area is large enough that CT perfusion could possibly make a reasonable differentiation.  PET Image Restage 05/29/16 IMPRESSION: 1. Stable exam with no evidence progression. 2. Photopenia in the LEFT frontal lobe at prior surgical site. 3. Stable mildly hypermetabolic RIGHT paratracheal node. This may represent a thyroid nodule. 4. Stable size of minimally metabolic lingular pulmonary nodule. 5. Stable skeletal lesion of the RIGHT iliac crest 6. No cutaneous metabolic lesion.   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   1. Metastatic melanoma to lung, node, bone and brain, BRAF V600K  mutation (+) -I previously  reviewed his PET/CT scan images with pt and his family members. The scan revealed hypermetabolic left upper lobe lung mass, 2 small right lung nodules, probable bone metastasis, and a subcutaneous left supraclavicular lesion, which is not palpable  -His repeat CT had showed a 2.0 cm left frontal hyperdense lesion with enhancement on contrast, in the area of his recent hemorrhagic stroke, highly suspicious for brain metastasis.  -We previously discussed his left supraclavicular node biopsy results, which showed metastatic melanoma.. -His case was discussed in our sarcoma tumor board. -I previously recommended a first-line treatment with Nivolumab alone, given his recent stroke, low tumor burden. I'll reserve BRAF inhibitor with or without MEK inhibitor as a second line therapy if he progress on immunotherapy. Other options of immunotherapy, especially checkpoint inhibitor with ipilumimab, were also discussed with pt and his daughter -He has been tolerating Nivolumab well, no significant side effects except fatigue. It has been held [place] due to his chronic steroids use -He has had near complete metabolic response on the recent restaging PET scan in July 2018 -We'll continue Delton See monthly for his bone metastasis. 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 He has achieved a radiographic complete response.  -He is a very low dose dexamethasone 2 mg daily, I recommend him to restart Nivolumab, I plan to continue Nivolumab for total of 2 years if he remains to be NED on scan -Lab reviewed and adequate to continue  treatment. We'll continue Nivolumab today and every 2 weeks, I'll potentially change his treatment to every 4 weeks after a few months if he is tolerating well. -Continue  tapering steroids (see below)    2. Hemorrhagic stroke and brain metastasis  -I encouraged him to continue physical therapy and occupational therapy. 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 However his clinical neurological function has deteriorated -I have previously discussed with our neuro-oncologist Dr. 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 Mickeal Skinner, his radionecrosis has improved, no other new lesions. He was also seen by Dr. Mickeal Skinner today who feels he is recovering well from neurological standpoint -I'll continue tapering his stool is, he is on '2mg'$  and '1mg'$  dialy alternatively dose schedule now, we'll change to 1 mg daily from tomorrow, until next visit in 2 weeks    3. 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 Mickeal Skinner to discuss treatment options to improve his neurologic function, patient is very frustrated about this, which significantly impacting his quality of life. -continue PT/OT  -CT Head 11/27/2016 showed continued evolution of post radiation changes in the left frontal lobe with further  decrease in the area of contrast enhancement and adjacent edema. No new contrast-enhancing lesions.  -The patient previously reviewed these results with neuro-oncologist, Dr. Mickeal Skinner.  4.  Hypercalcemia and AKI  -he was found to have calcium 13.4 today and Cr 2.0 on his office visit 11/01/2016 -Possibly related to his underlying malignancy (bone mets) and missing one dose of Xgeva  -He has restarted Xgeva monthly. Calcium has been normal   5. Fatigue, hypothyroidism and hypotestosteronemia -He has baseline hypothyroidism and hypotestosteronemia -His fatigue has much improved since he restarted testosterone injection by his primary care physician -He will follow-up with his primary care physician Dr. Asher Muir for his Synthroid dose adjustment  -His TSH was slightly In November 2017. TSH 9.6 on 06/15/16. - TSH 25.01 on 08/10/16. TSH 5.431 on 10/19/16.  -The patient will continue with light exercise as he is able. -I again encouraged him to be active -I encouraged him to continue outpt physical therapy  6. Anemia and history of GI bleeding  -his anemia has resolved now -I previously encouraged him to continue oral iron supplement for a total of 3 months   7. HTN -he is on amlodipine and carvedilol, follow-up of his primary care physician -Due to drug interaction with new oral chemo he will need to monitor his BP more often.   8. Depression  -he is on zoloft '50mg'$  daily, management by his PCP  -improved overall   9. CKD stage III -His Cr was previously slightly elevated -I previously encouraged him to drink water adequately -avoid NSAIDs  10. Goal of care discussion  -We previously discussed the incurable nature of his metastatic melanoma, and the overall poor prognosis, especially if he does not have good response to chemotherapy or progress on chemo -The patient understands the goal of care is palliative. -I recommend DNR/DNI,  he will think about it   11. CAD -history of 2 double vessel  bypass -He has Psychologist, forensic -per pt (her daughter) request, will switch his cardiologist to Dr. Haroldine Laws   Plan -continue Nivolamab today, and  every 2 weeks -reduce dexamethasone to 30m daily.  -Lab, Nivo and f/u in 2 weeks     All questions were answered. The patient knows to call the clinic with any problems, questions or concerns. No barriers to learning was detected.  I spent 25 minutes counseling the patient face to face. The total time spent in the appointment was 30 minutes and more than 50% was on counseling and review of test results.  This document serves as a record of services personally performed by YTruitt Merle MD. It was created on her behalf by WReola Mosher a trained medical scribe. The creation of this record is based on the scribe's personal observations and the provider's statements to them. This document has been checked and approved by the attending provider.  FTruitt Merle MD 01/01/17 7:01 PM

## 2017-01-05 ENCOUNTER — Telehealth: Payer: Self-pay | Admitting: Hematology

## 2017-01-05 NOTE — Telephone Encounter (Signed)
Called patient regarding October - November advised to ask for calendar

## 2017-01-12 NOTE — Progress Notes (Signed)
[place]  [phone] [phone]  Clinic Follow up Note   Patient Care Team: [name], MD as PCP - General (Internal Medicine) 01/15/2017  Chief complaint: Follow-up metastatic melanoma  Oncology History   Metastatic melanoma [facility])   Staging form: Melanoma of the Skin, AJCC 7th Edition     Clinical stage from 09/21/2015: Stage IV [place], [place], [place]) - Signed by [name], MD on 10/09/2015       Metastatic melanoma ([place])   09/16/2015 Imaging    PET scan showed a hypermetabolic 3.3LK lingular nodule, and a 66m right upper lobe lung nodule, left subclavicular nodes, and bone metastasis in the proximal right humerus and right femur.       09/21/2015 Initial Diagnosis    Metastatic melanoma ([place]      09/21/2015 Initial Biopsy    Left subclavicular lymph node biopsy showed prostatic of melanoma      09/21/2015 Miscellaneous    BRAF V600K mutation (+)       10/04/2015 Imaging    CT head with without contrast showed a 2.0 x 1.6 x 1.7 cm superficial left frontal hyperdense lesion, with postcontrast enhancement, lying within the previous hemorrhagic area, concerning for solitary melanoma metastasis      10/06/2015 - 09/06/2016 Chemotherapy    Nivolumab 2423mevery 2 weeks, held due to his dexamethasone for radionecrosis       10/13/2015 - 10/13/2015 Radiation Therapy    10/13/2015 Preop SRS treatment: 14 Gy  to the left frontal lesion in 1 fraction      10/15/2015 Surgery    left front craniotomy and tumor excision       10/15/2015 Pathology Results    left frontal brain mass excision showed metastatic melanoma, 1.5X1.5X0.6cm      01/13/2016 Imaging    CT head without and with contrast showed ill-defined enhancement and dural thickening of the left frontal resection cavity may represent a post treatment changes or residual neoplasm. A new 6 mm hypodensity in the right cerebellar hemisphere with uncertainty enhancement may represent a metastasis or  small brain parenchymal hemorrhage.       01/27/2016 - 01/27/2016 Radiation Therapy    01/27/16 SRS Treatment:  20 Gy in 1 fraction to a 6 mm right cerebellar brain met       03/29/2016 PET scan    PET 03/29/2016 IMPRESSION: 1. Overall there has been an interval response to therapy. The index lesions sided on previous exam it are less hypermetabolic when compared with the previous exam. 2. There is an intramuscular lesion within the right masseter which has increased FDG uptake compared with previous exam. 3. No new sites of disease identified. 4. Aortic atherosclerosis and infrarenal abdominal aortic aneurysm. Recommend followup by ultrasound in 3 years. This recommendation follows ACR consensus guidelines: White Paper of the ACR Incidental Findings Committee II on Vascular Findings. J Joellyn Ruedadiol 2013; 10:789-794      04/20/2016 Imaging    CT Head w wo contrast 1. Mild patchy enhancement at the left anterior frontal lobe treatment site (series 11, image 39) without mass effect and stable surrounding white matter hypodensity without strong evidence of acute vasogenic edema. Recommend continued CT surveillance. 2. No new metastatic disease or new intracranial abnormality identified. 3. Probable benign 14 mm left parietal bone lucent lesion, unchanged since May 2017.      05/06/2016 - 05/08/2016 Hospital Admission    Pt was admitted for rectal bleeding for one day. Breathing spontaneously  resolved, hemoglobin dropped from 15 to 10.5, did not require blood transfusion. Colonoscopy showed no active bleeding, except diverticulosis and hemorrhoid. He was discharged to home.      05/08/2016 Procedure    Colonoscopy  - Stool in the ascending colon. Fluid aspiration performed. - Diverticulosis in the entire examined colon. - Non-bleeding internal hemorrhoids. - The examination was otherwise normal on direct and retroflexion views.      05/29/2016 Imaging    PET Image  Restaging IMPRESSION: 1. Stable exam with no evidence progression. 2. Photopenia in the LEFT frontal lobe at prior surgical site. 3. Stable mildly hypermetabolic RIGHT paratracheal node. This may represent a thyroid nodule. 4. Stable size of minimally metabolic lingular pulmonary nodule. 5. Stable skeletal lesion of the RIGHT iliac crest 6. No cutaneous metabolic lesion.      07/20/2016 Imaging    CT Head W WO Contrast IMPRESSION: Pronounced change in the left frontal region. Much more regional vasogenic edema. Relatively stable appearance of the abnormal enhancement extending from the lateral margin of the frontal horn of left lateral ventricle to the frontal brain surface. New region of abnormal enhancement surrounding the frontal horn of the left lateral ventricle measuring approximately 3.2 cm. The differential diagnosis is recurrent tumor versus radiation necrosis. The patient is reportedly not an MRI candidate. This area is large enough that CT perfusion could possibly make a reasonable differentiation.      09/02/2016 Imaging    CT Head 09/02/16 IMPRESSION: Somewhat mixed pattern of slight posterior progression of pleomorphic enhancement surrounding the LEFT lateral ventricle although no significant mass effect, and reduced vasogenic edema compared with the scan in April.      09/27/2016 - 11/02/2016 Chemotherapy    Start dabrafenib and trametinib 09/27/16, stopped due to severe fatigue        10/05/2016 Imaging    CT Head w & w/o contrast  IMPRESSION: 1. Decreased size of the enhancing area within the left frontal lobe treatment site. Surrounding edema has also decreased. The findings support continued evolution of radiation necrosis. 2. No new enhancing lesions.      10/18/2016 PET scan    IMPRESSION: 1. New focal hypermetabolism in the proximal sigmoid colon with associated focal colonic wall thickening and mild pericolonic fat stranding at the site of a proximal  sigmoid diverticulum, favored to represent mild acute sigmoid diverticulitis. Metastatic disease is considered less likely. Clinical correlation is necessary. Consider appropriate antibiotic therapy, as clinically warranted. Attention to this region recommended on future PET-CT follow-up. 2. Otherwise complete metabolic response with no evidence of hypermetabolic metastatic disease. Lingular pulmonary nodule is slightly decreased in size and demonstrates no significant metabolism. 3. No residual hypermetabolism in the right thyroid lobe nodule, which is stable in size. 4. Aortic Atherosclerosis (ICD10-I70.0). Stable 3.1 cm infrarenal abdominal aortic aneurysm. Aortic aneurysm NOS (ICD10-I71.9). Recommend followup by ultrasound in 3 years. This recommendation follows ACR consensus guidelines: White Paper of the ACR Incidental Findings Committee II on Vascular Findings. Joellyn Rued Radiol 2013; 83:662-947.      11/01/2016 - 11/04/2016 Hospital Admission    Patient admitted to the hospital due to hypercalcemia where he received IV fluids and pamidronate      12/13/2016 PET scan    PET 12/13/16 IMPRESSION: 1. No findings to suggest metastatic disease on today's examination. 2. Previously noted hypermetabolism in the sigmoid colon has resolved, presumably indicative of a focus of diverticulitis on the prior study. Today's study does demonstrate relatively diffuse hypermetabolism throughout the  cecum, ascending colon and proximal transverse colon where there is some mild mural thickening, favored to reflect mild chronic inflammation related to underlying diverticular disease. No definite inflammatory changes on the CT portion of the examination to strongly suggest an active acute diverticulitis at this time. No discrete lesion to suggest neoplasm. 3. Previously noted 8 mm lingular nodule is stable in size and known remains non hypermetabolic. This is nonspecific but reassuring. Continued  attention on future followup studies is recommended. 4. Aortic atherosclerosis, in addition to left main and 3 vessel coronary artery disease. Status post median sternotomy for CABG including LIMA to the LAD. In addition, there is mild fusiform aneurysmal dilatation of the infrarenal abdominal aorta (3.1 cm in diameter) as well as aneurysmal dilatation of the left common iliac      12/18/2016 -  Chemotherapy    Restarted nivolamab 28m every 2 weeks, with tapering dose dexamethasone         History of present illness (09/02/2015): Pt is a 76y.o. Retired dPharmacist, communitywith history of HTN, OSA, CKD, PPM who was admitted on 08/22/15 with expressive difficulty. He was found to have left frontal hemorrhage 5.7 cm x 4.5 cm with 6 mm left to right shift.During the workup for his stroke, a left upper lung mass was noticed on the CT scan. I was called to evaluate his lung mass.  Patient has aphasia, not able to communicate much. According to his daughter, Dr. LGala Romney who is a OB GYN at [facility] he probably only smoked for very short period time during the college. He did have 3 skin melanoma, which was removed, last one was 2-3 years ago. No other cancer history.  CURRENT THERAPY:   1.Supportive care and dexamethasone, tapering dose, currently 1 mg every other day for 2 weeks then stop by 01/29/17 2. restarted nivolamab on 12/18/2016, every 2 weeks, plan to change to every 4 weeks in a few months    INTERVAL HISTORY:  Mr. HIlesreturns for follow-up alone. He notes he is not going backwards. He is not doing much exercise. He notes he does not go to PT anymore. He knows he will need to start working out himself. He is still tapering Dexamethasone and he has not noticed much change. He report his license was taken away because he has no short-term memory. He needs a physician to get his license reinstated within the next 3 weeks. He is fine without driving. He does have someone to drive him  around from the Physical Therapy center. Overall he is doing well.    REVIEW OF SYSTEMS:  Constitutional: Denies fevers, chills or abnormal weight loss Eyes: Denies blurriness of vision Ears, nose, mouth, throat, and face: Denies mucositis or sore throat Respiratory: Denies cough, dyspnea or wheezes Cardiovascular: Denies palpitation, chest discomfort or lower extremity swelling Gastrointestinal:  Denies nausea, heartburn, hematochezia  Skin: Negative. Lymphatics: Denies new lymphadenopathy or easy bruising Neurological:see HPI  Behavioral/Psych: Mood is stable, no new changes (+) trouble processing short and long term memory All other systems were reviewed with the patient and are negative.  MEDICAL HISTORY:  Past Medical History:  Diagnosis Date  . Anginal pain ([place]   . Arthritis    mostly hands  . Benign familial hematuria   . BPH (benign prostatic hypertrophy)   . Brain cancer ([place]    melanoma with brain met  . Coronary artery disease   . GERD (gastroesophageal reflux disease)   . High cholesterol   . Hypertension   .  Hypothyroidism   . Intracerebral hemorrhage ([place])   . Melanoma ([place])   . Metastatic melanoma to lung ([place])   . Mood disorder ([place])   . Myocardial infarction ([place])   . Pacemaker    due to syncope, 3rd degree HB (upgrade to [facility]. Jude CRT-P 02/25/13 (Dr. Uvaldo Rising)  . Rectal bleed    due to NSAIDS  . Renal disorder   . Skin cancer    melanoma  . Sleep apnea    uses CPAP  . Stroke [facility])     SURGICAL HISTORY: Past Surgical History:  Procedure Laterality Date  . APPLICATION OF CRANIAL NAVIGATION N/A 10/15/2015   Procedure: APPLICATION OF CRANIAL NAVIGATION;  Surgeon: Erline Levine, MD;  Location: [place] NEURO ORS;  Service: Neurosurgery;  Laterality: N/A;  . CARDIAC CATHETERIZATION  2013  . CARDIAC SURGERY     bypass X 2  . COLONOSCOPY WITH PROPOFOL N/A 05/08/2016   Procedure: COLONOSCOPY WITH PROPOFOL;  Surgeon: Jonathon Bellows, MD;  Location[facility] ENDOSCOPY;   Service: Gastroenterology;  Laterality: N/A;  . CORONARY ARTERY BYPASS GRAFT  2013   LIMA-LAD, SVG-PDA 03/27/11 (Dr. Francee Gentile)  . CRANIOTOMY N/A 10/15/2015   Procedure: LEFT FRONTAL CRANIOTOMY TUMOR EXCISION with Curve;  Surgeon: Erline Levine, MD;  Location: [place] NEURO ORS;  Service: Neurosurgery;  Laterality: N/A;  CRANIOTOMY TUMOR EXCISION  . EYE SURGERY    . FOOT SURGERY  08/2010  . JOINT REPLACEMENT Left    partial knee  . KNEE ARTHROSCOPY  12/2008  . LYMPH NODE BIOPSY    . PACEMAKER INSERTION    . TONSILLECTOMY      I have reviewed the social history and family history with the patient and they are unchanged from previous note.  ALLERGIES:  is allergic to zocor [simvastatin].  MEDICATIONS:  Current Outpatient Prescriptions  Medication Sig Dispense Refill  . aspirin EC 81 MG tablet Take 81 mg by mouth daily.    . carvedilol (COREG) 6.25 MG tablet Take 6.25 mg by mouth 2 (two) times daily with a meal.    . dexamethasone (DECADRON) 1 MG tablet Take 2 tablets (2 mg total) by mouth daily with breakfast. 60 tablet 0  . feeding supplement, ENSURE ENLIVE, (ENSURE ENLIVE) LIQD Take 237 mLs by mouth 2 (two) times daily between meals.    Marland Kitchen levothyroxine (SYNTHROID, LEVOTHROID) 175 MCG tablet Take 175 mcg by mouth daily before breakfast.    . pentoxifylline (TRENTAL) 400 MG CR tablet Take 1 tablet (400 mg total) by mouth 2 (two) times daily. 60 tablet 6  . rosuvastatin (CRESTOR) 10 MG tablet Take 10 mg by mouth at bedtime.    . sertraline (ZOLOFT) 50 MG tablet Take 100 mg by mouth daily.     Marland Kitchen testosterone cypionate (DEPOTESTOTERONE CYPIONATE) 100 MG/ML injection Inject 100 mg into the muscle every 14 (fourteen) days.     . vitamin E (VITAMIN E) 400 UNIT capsule Take 1 capsule (400 Units total) by mouth 2 (two) times daily. 60 capsule 6   No current facility-administered medications for this visit.    Facility-Administered Medications Ordered in Other Visits  Medication Dose Route Frequency  Provider Last Rate Last Dose  . 0.9 %  sodium chloride infusion  1,000 mL Intravenous Once [name], MD        PHYSICAL EXAMINATION:  ECOG PERFORMANCE STATUS: 1 BP 136/88 (BP Location: Left Arm, Patient Position: Sitting)   Pulse 87   Temp 98 F (36.7 C) (Oral)   Resp 18  Ht 5' 9.5" (1.765 m)   Wt 206 lb 2.2 oz (93.5 kg)   SpO2 95%   BMI 30.01 kg/m    GENERAL:alert, no distress and comfortable SKIN: skin color, texture, turgor are normal, (+) diffuse papular skin rash on bilateral forearms and hand, improved EYES: normal, Conjunctiva are pink and non-injected, sclera clear OROPHARYNX:no exudate, no erythema and lips, buccal mucosa, and tongue normal  NECK: supple, thyroid normal size, non-tender, without nodularity LYMPH:  no palpable lymphadenopathy in the cervical, [place], axillary or inguinal LUNGS: clear to auscultation and percussion with normal breathing effort HEART: regular rate & rhythm and no murmurs and no lower extremity edema ABDOMEN:abdomen soft, non-tender and normal bowel sounds Musculoskeletal:no cyanosis of digits and no clubbing (+)weaker right arm  NEURO: alert & oriented x 3 with fluent speech, no focal motor/sensory deficits, he walks independently, gait is normal   LABORATORY DATA:  I have reviewed the data as listed CBC Latest Ref Rng & Units 01/15/2017 01/01/2017 12/18/2016  WBC 4.0 - 10.3 10e3/uL 5.0 5.1 8.7  Hemoglobin 13.0 - 17.1 g/dL 15.0 15.0 14.9  Hematocrit 38.4 - 49.9 % 43.9 44.7 43.8  Platelets 140 - 400 10e3/uL 159 140 164     CMP Latest Ref Rng & Units 01/15/2017 01/01/2017 12/18/2016  Glucose 70 - 140 mg/dl 90 146(H) 120  BUN 7.0 - 26.0 mg/dL 36.8(H) 39.2(H) 39.6(H)  Creatinine 0.7 - 1.3 mg/dL 1.2 1.4(H) 1.2  Sodium 136 - 145 mEq/L 140 143 142  Potassium 3.5 - 5.1 mEq/L 4.5 4.1 4.8  Chloride 101 - 111 mmol/L - - -  CO2 22 - 29 mEq/L 23 30(H) 22  Calcium 8.4 - 10.4 mg/dL 9.4 9.7 9.0  Total Protein 6.4 - 8.3 g/dL 6.8 6.9 6.6  Total Bilirubin  0.20 - 1.20 mg/dL 0.53 0.64 0.42  Alkaline Phos 40 - 150 U/L 42 43 44  AST 5 - 34 U/L _0 ALT 0 - 55 U/L _1 Results for MAVRYK, PINO (MRN [id number]) as of 11/27/2016 13:46  Ref. Range 08/23/2016 10:16 09/06/2016 08:51 09/20/2016 09:51 10/19/2016 09:25  TSH Latest Ref Range: 0.320 - 4.118 m(IU)/L 13.758 (H) 2.376 0.941 5.431 (H)  PENDING for 01/15/17  PATHOLOGY REPORT: Diagnosis 09/21/2015 Lymph node, needle/core biopsy, hypermetabolic left sub clavicular LN - METASTATIC MALIGNANT MELANOMA. - SEE COMMENT.  Diagnosis 10/15/2015 Brain, for tumor resection, Left frontal - METASTATIC MELANOMA.     PROCEDURES: Colonoscopy 05/08/16 - Stool in the ascending colon. Fluid aspiration performed. - Diverticulosis in the entire examined colon. - Non-bleeding internal hemorrhoids. - The examination was otherwise normal on direct and retroflexion views.  RADIOGRAPHIC STUDIES: I have personally reviewed the radiological images as listed and agreed with the findings in the report. No results found.   CT Head with and without contrast 11/27/2016 IMPRESSION: 1. Continued evolution of post radiation changes in the left frontal lobe with further decrease in the area of contrast enhancement and adjacent edema. 2. No new contrast-enhancing lesions.  PET 10/18/16 IMPRESSION: 1. New focal hypermetabolism in the proximal sigmoid colon with associated focal colonic wall thickening and mild pericolonic fat stranding at the site of a proximal sigmoid diverticulum, favored to represent mild acute sigmoid diverticulitis. Metastatic disease is considered less likely. Clinical correlation is necessary. Consider appropriate antibiotic therapy, as clinically warranted. Attention to this region recommended on future PET-CT follow-up. 2. Otherwise complete metabolic response with no evidence of hypermetabolic metastatic disease. Lingular pulmonary nodule is slightly decreased  in size and  demonstrates no significant metabolism. 3. No residual hypermetabolism in the right thyroid lobe nodule, which is stable in size. 4. Aortic Atherosclerosis (ICD10-I70.0). Stable 3.1 cm infrarenal abdominal aortic aneurysm. Aortic aneurysm NOS (ICD10-I71.9). Recommend followup by ultrasound in 3 years. This recommendation follows ACR consensus guidelines: White Paper of the ACR Incidental Findings Committee II on Vascular Findings. J Am Coll Radiol 2013;   CT Head W and WO Contrast 10/05/16 IMPRESSION: 1. Decreased size of the enhancing area within the left frontal lobe treatment site. Surrounding edema has also decreased. The findings support continued evolution of radiation necrosis. 2. No new enhancing lesions.  CT Head 09/02/16 IMPRESSION: Somewhat mixed pattern of slight posterior progression of pleomorphic enhancement surrounding the LEFT lateral ventricle although no significant mass effect, and reduced vasogenic edema compared with the scan in April.  CT Head W WO Contrast 07-23-2016 IMPRESSION: Pronounced change in the left frontal region. Much more regional vasogenic edema. Relatively stable appearance of the abnormal enhancement extending from the lateral margin of the frontal horn of left lateral ventricle to the frontal brain surface. New region of abnormal enhancement surrounding the frontal horn of the left lateral ventricle measuring approximately 3.2 cm. The differential diagnosis is recurrent tumor versus radiation necrosis. The patient is reportedly not an MRI candidate. This area is large enough that CT perfusion could possibly make a reasonable differentiation.  PET Image Restage 05/29/16 IMPRESSION: 1. Stable exam with no evidence progression. 2. Photopenia in the LEFT frontal lobe at prior surgical site. 3. Stable mildly hypermetabolic RIGHT paratracheal node. This may represent a thyroid nodule. 4. Stable size of minimally metabolic lingular pulmonary  nodule. 5. Stable skeletal lesion of the RIGHT iliac crest 6. No cutaneous metabolic lesion.   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   1. Metastatic melanoma to lung, node, bone and brain, BRAF V600K mutation (+) -I previously  reviewed his PET/CT scan images with pt and his family members. The scan revealed hypermetabolic left upper lobe lung mass, 2 small right lung nodules, probable bone metastasis, and a subcutaneous left supraclavicular lesion, which is not palpable  -His repeat CT had showed a 2.0 cm left frontal hyperdense lesion with enhancement on contrast, in the area of his recent hemorrhagic stroke, highly suspicious for brain metastasis.  -We previously discussed his left supraclavicular node biopsy results, which showed metastatic melanoma.. -His case was discussed in our sarcoma tumor board. -I previously recommended a first-line treatment with Nivolumab alone, given his recent stroke, low tumor burden. I'll reserve BRAF inhibitor with or without MEK inhibitor as a second line therapy if he progress on immunotherapy. Other options of immunotherapy, especially checkpoint inhibitor with ipilumimab, were also discussed with pt and his daughter -He has been tolerating Nivolumab well, no significant side effects except fatigue. It has been held [place] due to his chronic steroids use -He has had near complete metabolic response on the recent restaging PET scan in July 2018 -We'll continue Delton See monthly for his bone metastasis. -He has recently developed neurological symptoms secondary to his radionecrosis in May 2018, on tapering dose of dexa  -He tried dabrafenib and trametinib but tolerated poorly, stopped on 11/01/2016 -PET can 12/13/16 shows no evidence of disease. He has achieved a radiographic complete response.  -He has restarted Nivo.  I  plan to continue Nivolumab for total of 2 years if he  remains to be NED on scan. I'll potentially change his treatment to every 4 weeks after a few months if he is tolerating well. -Labs reviewed and adequate to continue nivolamab.  -Continue tapering steroids (see below). If doing well by next visit we will change is nivolamab to every 4 weeks.  -I encouraged him to be active with walking or the gym since he has completed physical therapy -F/u in 2 weeks    2. Hemorrhagic stroke and brain metastasis  -I encouraged him to continue physical therapy and occupational therapy. 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 However his clinical neurological function has deteriorated -I have previously discussed with our neuro-oncologist Dr. 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 Mickeal Skinner, his radionecrosis has improved, no other new lesions. He was also seen by Dr. Mickeal Skinner who feels he is recovering well from neurological standpoint -I'll continue tapering his steroids, he is on 54m dialy alternatively dose schedule, we'll change to 1 mg every other day for 2 weeks then stop altogether by 01/29/17 -I will refer him to neoro-oncologist Dr. VMickeal Skinner  3. Memory loss and cognitive dysfunction -He has developed a worsening memory loss, mild aphasia, cognitive dysfunction since May 2018, likely secondary to the radionecrosis of his treated brain metastasis, and previous stroke  -he also has mild right arm weakness lately  -I will refer him to our new  oncologist Dr. VMickeal Skinnerto discuss treatment options to improve his neurologic function, patient is very frustrated about this, which significantly impacting his quality of life. -continue PT/OT  -CT Head 11/27/2016 showed continued evolution of post radiation changes in the left frontal lobe with further decrease in the area of contrast enhancement and adjacent edema. No new contrast-enhancing lesions.  -The patient previously reviewed these results with neuro-oncologist, Dr. VMickeal Skinner  4.  Hypercalcemia and AKI  -he was found to have calcium 13.4 and Cr 2.0 on his office visit 11/01/2016 -Possibly related to his underlying malignancy (bone mets) and missing one dose of Xgeva  -He has restarted Xgeva monthly on 11/10/16. Calcium has been normal   5. Fatigue, hypothyroidism and hypotestosteronemia -He has baseline hypothyroidism and hypotestosteronemia -His fatigue has much improved since he restarted testosterone injection by his primary care physician -He will follow-up with his primary care physician Dr. RAsher Muirfor his Synthroid dose adjustment  -His TSH was slightly In November 2017. TSH 9.6 on 06/15/16. -TSH 25.01 on 08/10/16. TSH 5.431 on 10/19/16.  -The patient will continue with light exercise as he is able. -I again encouraged him to be active -I encouraged him to continue outpt physical therapy  6. Anemia and history of GI bleeding  -his anemia has resolved now -I previously encouraged him to continue oral iron supplement for a total of 3 months  -Anemia has been stable and resolved lately   7. HTN  -he is on amlodipine and carvedilol, follow-up of his primary care physician -Due to drug interaction with new oral chemo he will need to monitor his BP more often.   8. Depression  -  he is on zoloft 94m daily, management by his PCP  -improved overall   9. CKD stage III -His Cr was previously slightly elevated -I previously encouraged him to drink water adequately -avoid NSAIDs  10.  Goal of care discussion  -We previously discussed the incurable nature of his metastatic melanoma, and the overall poor prognosis, especially if he does not have good response to chemotherapy or progress on chemo -The patient understands the goal of care is palliative. -I recommend DNR/DNI, he will think about it   11. CAD -history of 2 double vessel bypass -He has pPsychologist, forensic-per pt (her daughter) request, he has switched his cardiologist to Dr. BHaroldine Laws  Plan -continue Nivolamab today, and  every 2 weeks -reduce dexamethasone to 127mevery other day for 2 weeks, then stop  -Lab, Nivo and f/u in 2 weeks, and will change Nivo to every 4 weeks from next treatment if he is doing well  -refer to Dr. VaMickeal Skinner  All questions were answered. The patient knows to call the clinic with any problems, questions or concerns. No barriers to learning was detected.  I spent 20 minutes counseling the patient face to face. The total time spent in the appointment was 25 minutes and more than 50% was on counseling and review of test results.  This document serves as a record of services personally performed by YaTruitt MerleMD. It was created on her behalf by AmJoslyn Devona trained medical scribe. The creation of this record is based on the scribe's personal observations and the provider's statements to them. This document has been checked and approved by the attending provider.   FeTruitt MerleMD 01/15/17

## 2017-01-15 ENCOUNTER — Ambulatory Visit (HOSPITAL_BASED_OUTPATIENT_CLINIC_OR_DEPARTMENT_OTHER): Payer: Medicare Other | Admitting: Hematology

## 2017-01-15 ENCOUNTER — Encounter: Payer: Self-pay | Admitting: Hematology

## 2017-01-15 ENCOUNTER — Ambulatory Visit (HOSPITAL_BASED_OUTPATIENT_CLINIC_OR_DEPARTMENT_OTHER): Payer: Medicare Other

## 2017-01-15 ENCOUNTER — Other Ambulatory Visit (HOSPITAL_BASED_OUTPATIENT_CLINIC_OR_DEPARTMENT_OTHER): Payer: Medicare Other

## 2017-01-15 VITALS — BP 136/88 | HR 87 | Temp 98.0°F | Resp 18 | Ht 69.5 in | Wt 206.1 lb

## 2017-01-15 DIAGNOSIS — C7931 Secondary malignant neoplasm of brain: Secondary | ICD-10-CM

## 2017-01-15 DIAGNOSIS — E291 Testicular hypofunction: Secondary | ICD-10-CM

## 2017-01-15 DIAGNOSIS — I619 Nontraumatic intracerebral hemorrhage, unspecified: Secondary | ICD-10-CM

## 2017-01-15 DIAGNOSIS — C799 Secondary malignant neoplasm of unspecified site: Secondary | ICD-10-CM

## 2017-01-15 DIAGNOSIS — I10 Essential (primary) hypertension: Secondary | ICD-10-CM

## 2017-01-15 DIAGNOSIS — C770 Secondary and unspecified malignant neoplasm of lymph nodes of head, face and neck: Secondary | ICD-10-CM | POA: Diagnosis not present

## 2017-01-15 DIAGNOSIS — R413 Other amnesia: Secondary | ICD-10-CM

## 2017-01-15 DIAGNOSIS — Z5112 Encounter for antineoplastic immunotherapy: Secondary | ICD-10-CM | POA: Diagnosis present

## 2017-01-15 DIAGNOSIS — C7800 Secondary malignant neoplasm of unspecified lung: Secondary | ICD-10-CM | POA: Diagnosis not present

## 2017-01-15 DIAGNOSIS — C439 Malignant melanoma of skin, unspecified: Secondary | ICD-10-CM | POA: Diagnosis not present

## 2017-01-15 DIAGNOSIS — N179 Acute kidney failure, unspecified: Secondary | ICD-10-CM | POA: Diagnosis not present

## 2017-01-15 DIAGNOSIS — R5383 Other fatigue: Secondary | ICD-10-CM

## 2017-01-15 DIAGNOSIS — C7951 Secondary malignant neoplasm of bone: Secondary | ICD-10-CM

## 2017-01-15 DIAGNOSIS — Z8673 Personal history of transient ischemic attack (TIA), and cerebral infarction without residual deficits: Secondary | ICD-10-CM | POA: Diagnosis not present

## 2017-01-15 DIAGNOSIS — N183 Chronic kidney disease, stage 3 (moderate): Secondary | ICD-10-CM

## 2017-01-15 DIAGNOSIS — F329 Major depressive disorder, single episode, unspecified: Secondary | ICD-10-CM

## 2017-01-15 DIAGNOSIS — E039 Hypothyroidism, unspecified: Secondary | ICD-10-CM

## 2017-01-15 LAB — COMPREHENSIVE METABOLIC PANEL
ALT: 16 U/L (ref 0–55)
AST: 16 U/L (ref 5–34)
Albumin: 3.6 g/dL (ref 3.5–5.0)
Alkaline Phosphatase: 42 U/L (ref 40–150)
Anion Gap: 7 mEq/L (ref 3–11)
BUN: 36.8 mg/dL — AB (ref 7.0–26.0)
CALCIUM: 9.4 mg/dL (ref 8.4–10.4)
CHLORIDE: 110 meq/L — AB (ref 98–109)
CO2: 23 meq/L (ref 22–29)
Creatinine: 1.2 mg/dL (ref 0.7–1.3)
EGFR: 57 mL/min/{1.73_m2} — ABNORMAL LOW (ref >90)
Glucose: 90 mg/dl (ref 70–140)
POTASSIUM: 4.5 meq/L (ref 3.5–5.1)
SODIUM: 140 meq/L (ref 136–145)
Total Bilirubin: 0.53 mg/dL (ref 0.20–1.20)
Total Protein: 6.8 g/dL (ref 6.4–8.3)

## 2017-01-15 LAB — CBC WITH DIFFERENTIAL/PLATELET
BASO%: 0.2 % (ref 0.0–2.0)
BASOS ABS: 0 10*3/uL (ref 0.0–0.1)
EOS%: 2.4 % (ref 0.0–7.0)
Eosinophils Absolute: 0.1 10*3/uL (ref 0.0–0.5)
HEMATOCRIT: 43.9 % (ref 38.4–49.9)
HGB: 15 g/dL (ref 13.0–17.1)
LYMPH%: 14.9 % (ref 14.0–49.0)
MCH: 32.5 pg (ref 27.2–33.4)
MCHC: 34.2 g/dL (ref 32.0–36.0)
MCV: 95 fL (ref 79.3–98.0)
MONO#: 0.2 10*3/uL (ref 0.1–0.9)
MONO%: 4.6 % (ref 0.0–14.0)
NEUT#: 3.9 10*3/uL (ref 1.5–6.5)
NEUT%: 77.9 % — AB (ref 39.0–75.0)
Platelets: 159 10*3/uL (ref 140–400)
RBC: 4.62 10*6/uL (ref 4.20–5.82)
RDW: 13.3 % (ref 11.0–14.6)
WBC: 5 10*3/uL (ref 4.0–10.3)
lymph#: 0.8 10*3/uL — ABNORMAL LOW (ref 0.9–3.3)

## 2017-01-15 LAB — TSH

## 2017-01-15 MED ORDER — SODIUM CHLORIDE 0.9 % IV SOLN
Freq: Once | INTRAVENOUS | Status: AC
  Administered 2017-01-15: 13:00:00 via INTRAVENOUS

## 2017-01-15 MED ORDER — DENOSUMAB 120 MG/1.7ML ~~LOC~~ SOLN
120.0000 mg | Freq: Once | SUBCUTANEOUS | Status: AC
  Administered 2017-01-15: 120 mg via SUBCUTANEOUS
  Filled 2017-01-15: qty 1.7

## 2017-01-15 MED ORDER — NIVOLUMAB CHEMO INJECTION 100 MG/10ML
240.0000 mg | Freq: Once | INTRAVENOUS | Status: AC
  Administered 2017-01-15: 240 mg via INTRAVENOUS
  Filled 2017-01-15: qty 24

## 2017-01-15 NOTE — Patient Instructions (Signed)
[facility] Discharge Instructions for Patients Receiving Chemotherapy  Today you received the following chemotherapy agents: Nivolumab  To help prevent nausea and vomiting after your treatment, we encourage you to take your nausea medication as directed.    If you develop nausea and vomiting that is not controlled by your nausea medication, call the clinic.   BELOW ARE SYMPTOMS THAT SHOULD BE REPORTED IMMEDIATELY:  *FEVER GREATER THAN 100.5 F  *CHILLS WITH OR WITHOUT FEVER  NAUSEA AND VOMITING THAT IS NOT CONTROLLED WITH YOUR NAUSEA MEDICATION  *UNUSUAL SHORTNESS OF BREATH  *UNUSUAL BRUISING OR BLEEDING  TENDERNESS IN MOUTH AND THROAT WITH OR WITHOUT PRESENCE OF ULCERS  *URINARY PROBLEMS  *BOWEL PROBLEMS  UNUSUAL RASH Items with * indicate a potential emergency and should be followed up as soon as possible.  Feel free to call the clinic should you have any questions or concerns. The clinic phone number is [phone].  Please show the CHEMO ALERT CARD at check-in to the Emergency Department and triage nurse.   

## 2017-01-16 ENCOUNTER — Telehealth: Payer: Self-pay | Admitting: Hematology

## 2017-01-16 NOTE — Telephone Encounter (Signed)
Patient already scheduled. No 10/8 los.

## 2017-01-17 ENCOUNTER — Telehealth: Payer: Self-pay | Admitting: Hematology

## 2017-01-17 NOTE — Telephone Encounter (Signed)
Scheduled appt per 10/8 sch message - patient is aware of apt with Dr. Mickeal Skinner.

## 2017-01-19 ENCOUNTER — Telehealth: Payer: Self-pay | Admitting: *Deleted

## 2017-01-19 NOTE — Telephone Encounter (Signed)
-----   Message from [name], MD sent at 01/19/2017  2:38 PM EDT ----- Regan Rakers, could you contact his PCP about his TSH result, to see of his synthroid dose needs to be adjusted? This maybe related to Nivo. Thanks  [name]  01/19/2017

## 2017-01-19 NOTE — Telephone Encounter (Signed)
Labs will be routed to Dr  [name] with Dr Ernestina Penna message.

## 2017-01-22 ENCOUNTER — Telehealth: Payer: Self-pay | Admitting: Nurse Practitioner

## 2017-01-22 NOTE — Telephone Encounter (Addendum)
I called Dr. [name], patient's PCP, per instructions below. He is aware of TSH <0.080. He will f/u with the patient to manage this issue.   ----- Message from [name], MD sent at 01/19/2017  2:38 PM EDT ----- David [name], could you contact his PCP about his TSH result, to see of his synthroid dose needs to be adjusted? This maybe related to Nivo. Thanks  [name]  01/19/2017

## 2017-01-25 ENCOUNTER — Telehealth: Payer: Self-pay

## 2017-01-25 NOTE — Telephone Encounter (Signed)
[facility] called. They are managing the pt's meds, making up blister packs of his daily meds for the pt. They set him up with 2 weeks supply.   [facility] is asking about the tapering of the pt's decadron and his current dose so they can make his packs up right. Instructed them per last OV note 10/8 that dex is 1 mg every other day for 2 weeks then stop.

## 2017-01-30 ENCOUNTER — Ambulatory Visit (HOSPITAL_BASED_OUTPATIENT_CLINIC_OR_DEPARTMENT_OTHER): Payer: Medicare Other

## 2017-01-30 ENCOUNTER — Other Ambulatory Visit (HOSPITAL_BASED_OUTPATIENT_CLINIC_OR_DEPARTMENT_OTHER): Payer: Medicare Other

## 2017-01-30 ENCOUNTER — Other Ambulatory Visit: Payer: Self-pay | Admitting: *Deleted

## 2017-01-30 ENCOUNTER — Ambulatory Visit (HOSPITAL_BASED_OUTPATIENT_CLINIC_OR_DEPARTMENT_OTHER): Payer: Medicare Other | Admitting: Hematology

## 2017-01-30 ENCOUNTER — Telehealth: Payer: Self-pay | Admitting: Hematology

## 2017-01-30 VITALS — BP 139/94 | HR 82 | Temp 97.7°F | Resp 20 | Ht 69.5 in | Wt 204.8 lb

## 2017-01-30 DIAGNOSIS — Z79899 Other long term (current) drug therapy: Secondary | ICD-10-CM | POA: Diagnosis not present

## 2017-01-30 DIAGNOSIS — R5383 Other fatigue: Secondary | ICD-10-CM

## 2017-01-30 DIAGNOSIS — C7800 Secondary malignant neoplasm of unspecified lung: Secondary | ICD-10-CM

## 2017-01-30 DIAGNOSIS — C439 Malignant melanoma of skin, unspecified: Secondary | ICD-10-CM

## 2017-01-30 DIAGNOSIS — I10 Essential (primary) hypertension: Secondary | ICD-10-CM

## 2017-01-30 DIAGNOSIS — Z8673 Personal history of transient ischemic attack (TIA), and cerebral infarction without residual deficits: Secondary | ICD-10-CM | POA: Diagnosis not present

## 2017-01-30 DIAGNOSIS — N179 Acute kidney failure, unspecified: Secondary | ICD-10-CM

## 2017-01-30 DIAGNOSIS — E291 Testicular hypofunction: Secondary | ICD-10-CM

## 2017-01-30 DIAGNOSIS — C7951 Secondary malignant neoplasm of bone: Secondary | ICD-10-CM

## 2017-01-30 DIAGNOSIS — F329 Major depressive disorder, single episode, unspecified: Secondary | ICD-10-CM

## 2017-01-30 DIAGNOSIS — C770 Secondary and unspecified malignant neoplasm of lymph nodes of head, face and neck: Secondary | ICD-10-CM

## 2017-01-30 DIAGNOSIS — C7931 Secondary malignant neoplasm of brain: Secondary | ICD-10-CM | POA: Diagnosis not present

## 2017-01-30 DIAGNOSIS — Z5112 Encounter for antineoplastic immunotherapy: Secondary | ICD-10-CM

## 2017-01-30 DIAGNOSIS — E039 Hypothyroidism, unspecified: Secondary | ICD-10-CM

## 2017-01-30 DIAGNOSIS — L84 Corns and callosities: Secondary | ICD-10-CM

## 2017-01-30 DIAGNOSIS — R413 Other amnesia: Secondary | ICD-10-CM

## 2017-01-30 DIAGNOSIS — I2510 Atherosclerotic heart disease of native coronary artery without angina pectoris: Secondary | ICD-10-CM | POA: Diagnosis not present

## 2017-01-30 DIAGNOSIS — Z23 Encounter for immunization: Secondary | ICD-10-CM

## 2017-01-30 DIAGNOSIS — C799 Secondary malignant neoplasm of unspecified site: Secondary | ICD-10-CM

## 2017-01-30 DIAGNOSIS — I619 Nontraumatic intracerebral hemorrhage, unspecified: Secondary | ICD-10-CM

## 2017-01-30 DIAGNOSIS — N189 Chronic kidney disease, unspecified: Secondary | ICD-10-CM

## 2017-01-30 LAB — COMPREHENSIVE METABOLIC PANEL
ALT: 13 U/L (ref 0–55)
AST: 16 U/L (ref 5–34)
Albumin: 3.5 g/dL (ref 3.5–5.0)
Alkaline Phosphatase: 47 U/L (ref 40–150)
Anion Gap: 11 mEq/L (ref 3–11)
BUN: 29.1 mg/dL — AB (ref 7.0–26.0)
CHLORIDE: 107 meq/L (ref 98–109)
CO2: 22 mEq/L (ref 22–29)
Calcium: 9.7 mg/dL (ref 8.4–10.4)
Creatinine: 1.5 mg/dL — ABNORMAL HIGH (ref 0.7–1.3)
EGFR: 44 mL/min/{1.73_m2} — ABNORMAL LOW (ref >60)
Glucose: 146 mg/dl — ABNORMAL HIGH (ref 70–140)
POTASSIUM: 4.6 meq/L (ref 3.5–5.1)
SODIUM: 139 meq/L (ref 136–145)
Total Bilirubin: 0.56 mg/dL (ref 0.20–1.20)
Total Protein: 6.6 g/dL (ref 6.4–8.3)

## 2017-01-30 LAB — CBC WITH DIFFERENTIAL/PLATELET
BASO%: 0.7 % (ref 0.0–2.0)
BASOS ABS: 0 10*3/uL (ref 0.0–0.1)
EOS%: 3 % (ref 0.0–7.0)
Eosinophils Absolute: 0.2 10*3/uL (ref 0.0–0.5)
HCT: 42.7 % (ref 38.4–49.9)
HGB: 14.9 g/dL (ref 13.0–17.1)
LYMPH%: 8.6 % — ABNORMAL LOW (ref 14.0–49.0)
MCH: 32.1 pg (ref 27.2–33.4)
MCHC: 34.9 g/dL (ref 32.0–36.0)
MCV: 92 fL (ref 79.3–98.0)
MONO#: 0.5 10*3/uL (ref 0.1–0.9)
MONO%: 9.4 % (ref 0.0–14.0)
NEUT#: 4.5 10*3/uL (ref 1.5–6.5)
NEUT%: 78.3 % — AB (ref 39.0–75.0)
Platelets: 170 10*3/uL (ref 140–400)
RBC: 4.64 10*6/uL (ref 4.20–5.82)
RDW: 13.3 % (ref 11.0–14.6)
WBC: 5.7 10*3/uL (ref 4.0–10.3)
lymph#: 0.5 10*3/uL — ABNORMAL LOW (ref 0.9–3.3)

## 2017-01-30 LAB — TSH: TSH: 0.091 m(IU)/L — ABNORMAL LOW (ref 0.320–4.118)

## 2017-01-30 MED ORDER — SODIUM CHLORIDE 0.9 % IV SOLN
Freq: Once | INTRAVENOUS | Status: AC
  Administered 2017-01-30: 13:00:00 via INTRAVENOUS

## 2017-01-30 MED ORDER — LEVOTHYROXINE SODIUM 125 MCG PO TABS
125.0000 ug | ORAL_TABLET | Freq: Every day | ORAL | 0 refills | Status: DC
Start: 2017-01-30 — End: 2017-02-12

## 2017-01-30 MED ORDER — NIVOLUMAB CHEMO INJECTION 100 MG/10ML
480.0000 mg | Freq: Once | INTRAVENOUS | Status: AC
  Administered 2017-01-30: 480 mg via INTRAVENOUS
  Filled 2017-01-30: qty 48

## 2017-01-30 MED ORDER — INFLUENZA VAC SPLIT HIGH-DOSE 0.5 ML IM SUSY
0.5000 mL | PREFILLED_SYRINGE | Freq: Once | INTRAMUSCULAR | Status: AC
  Administered 2017-01-30: 0.5 mL via INTRAMUSCULAR
  Filled 2017-01-30: qty 0.5

## 2017-01-30 NOTE — Progress Notes (Signed)
[place]  [phone] [phone]) [phone]  Clinic Follow up Note   Patient Care Team: [name], MD as PCP - General (Internal Medicine) 01/30/2017  Chief complaint: Follow-up metastatic melanoma  Oncology History   Metastatic melanoma [facility])   Staging form: Melanoma of the Skin, AJCC 7th Edition     Clinical stage from 09/21/2015: Stage IV [place], [place], [place]) - Signed by [name], MD on 10/09/2015       Metastatic melanoma ([place])   09/16/2015 Imaging    PET scan showed a hypermetabolic 9.8JX lingular nodule, and a 65m right upper lobe lung nodule, left subclavicular nodes, and bone metastasis in the proximal right humerus and right femur.       09/21/2015 Initial Diagnosis    [place]astatic [place]      09/21/2015 Initial Biopsy    Left subclavicular lymph node biopsy showed prostatic of melanoma      09/21/2015 Miscellaneous    BRAF V600K mutation (+)       10/04/2015 Imaging    CT head with without contrast showed a 2.0 x 1.6 x 1.7 cm superficial left frontal hyperdense lesion, with postcontrast enhancement, lying within the previous hemorrhagic area, concerning for solitary melanoma metastasis      10/06/2015 - 09/06/2016 Chemotherapy    Nivolumab 2423mevery 2 weeks, held due to his dexamethasone for radionecrosis       10/13/2015 - 10/13/2015 Radiation Therapy    10/13/2015 Preop SRS treatment: 14 Gy  to the left frontal lesion in 1 fraction      10/15/2015 Surgery    left front craniotomy and tumor excision       10/15/2015 Pathology Results    left frontal brain mass excision showed metastatic melanoma, 1.5X1.5X0.6cm      01/13/2016 Imaging    CT head without and with contrast showed ill-defined enhancement and dural thickening of the left frontal resection cavity may represent a post treatment changes or residual neoplasm. A new 6 mm hypodensity in the right cerebellar hemisphere with uncertainty enhancement may represent a metastasis or  small brain parenchymal hemorrhage.       01/27/2016 - 01/27/2016 Radiation Therapy    01/27/16 SRS Treatment:  20 Gy in 1 fraction to a 6 mm right cerebellar brain met       03/29/2016 PET scan    PET 03/29/2016 IMPRESSION: 1. Overall there has been an interval response to therapy. The index lesions sided on previous exam it are less hypermetabolic when compared with the previous exam. 2. There is an intramuscular lesion within the right masseter which has increased FDG uptake compared with previous exam. 3. No new sites of disease identified. 4. Aortic atherosclerosis and infrarenal abdominal aortic aneurysm. Recommend followup by ultrasound in 3 years. This recommendation follows ACR consensus guidelines: White Paper of the ACR Incidental Findings Committee II on Vascular Findings. J Joellyn Ruedadiol 2013; 10:789-794      04/20/2016 Imaging    CT Head w wo contrast 1. Mild patchy enhancement at the left anterior frontal lobe treatment site (series 11, image 39) without mass effect and stable surrounding white matter hypodensity without strong evidence of acute vasogenic edema. Recommend continued CT surveillance. 2. No new metastatic disease or new intracranial abnormality identified. 3. Probable benign 14 mm left parietal bone lucent lesion, unchanged since May 2017.      05/06/2016 - 05/08/2016 Hospital Admission    Pt was admitted for rectal bleeding for one day. Breathing spontaneously  resolved, hemoglobin dropped from 15 to 10.5, did not require blood transfusion. Colonoscopy showed no active bleeding, except diverticulosis and hemorrhoid. He was discharged to home.      05/08/2016 Procedure    Colonoscopy  - Stool in the ascending colon. Fluid aspiration performed. - Diverticulosis in the entire examined colon. - Non-bleeding internal hemorrhoids. - The examination was otherwise normal on direct and retroflexion views.      05/29/2016 Imaging    PET Image  Restaging IMPRESSION: 1. Stable exam with no evidence progression. 2. Photopenia in the LEFT frontal lobe at prior surgical site. 3. Stable mildly hypermetabolic RIGHT paratracheal node. This may represent a thyroid nodule. 4. Stable size of minimally metabolic lingular pulmonary nodule. 5. Stable skeletal lesion of the RIGHT iliac crest 6. No cutaneous metabolic lesion.      07/20/2016 Imaging    CT Head W WO Contrast IMPRESSION: Pronounced change in the left frontal region. Much more regional vasogenic edema. Relatively stable appearance of the abnormal enhancement extending from the lateral margin of the frontal horn of left lateral ventricle to the frontal brain surface. New region of abnormal enhancement surrounding the frontal horn of the left lateral ventricle measuring approximately 3.2 cm. The differential diagnosis is recurrent tumor versus radiation necrosis. The patient is reportedly not an MRI candidate. This area is large enough that CT perfusion could possibly make a reasonable differentiation.      09/02/2016 Imaging    CT Head 09/02/16 IMPRESSION: Somewhat mixed pattern of slight posterior progression of pleomorphic enhancement surrounding the LEFT lateral ventricle although no significant mass effect, and reduced vasogenic edema compared with the scan in April.      09/27/2016 - 11/02/2016 Chemotherapy    Start dabrafenib and trametinib 09/27/16, stopped due to severe fatigue        10/05/2016 Imaging    CT Head w & w/o contrast  IMPRESSION: 1. Decreased size of the enhancing area within the left frontal lobe treatment site. Surrounding edema has also decreased. The findings support continued evolution of radiation necrosis. 2. No new enhancing lesions.      10/18/2016 PET scan    IMPRESSION: 1. New focal hypermetabolism in the proximal sigmoid colon with associated focal colonic wall thickening and mild pericolonic fat stranding at the site of a proximal  sigmoid diverticulum, favored to represent mild acute sigmoid diverticulitis. Metastatic disease is considered less likely. Clinical correlation is necessary. Consider appropriate antibiotic therapy, as clinically warranted. Attention to this region recommended on future PET-CT follow-up. 2. Otherwise complete metabolic response with no evidence of hypermetabolic metastatic disease. Lingular pulmonary nodule is slightly decreased in size and demonstrates no significant metabolism. 3. No residual hypermetabolism in the right thyroid lobe nodule, which is stable in size. 4. Aortic Atherosclerosis (ICD10-I70.0). Stable 3.1 cm infrarenal abdominal aortic aneurysm. Aortic aneurysm NOS (ICD10-I71.9). Recommend followup by ultrasound in 3 years. This recommendation follows ACR consensus guidelines: White Paper of the ACR Incidental Findings Committee II on Vascular Findings. Joellyn Rued Radiol 2013; 83:662-947.      11/01/2016 - 11/04/2016 Hospital Admission    Patient admitted to the hospital due to hypercalcemia where he received IV fluids and pamidronate      12/13/2016 PET scan    PET 12/13/16 IMPRESSION: 1. No findings to suggest metastatic disease on today's examination. 2. Previously noted hypermetabolism in the sigmoid colon has resolved, presumably indicative of a focus of diverticulitis on the prior study. Today's study does demonstrate relatively diffuse hypermetabolism throughout the  cecum, ascending colon and proximal transverse colon where there is some mild mural thickening, favored to reflect mild chronic inflammation related to underlying diverticular disease. No definite inflammatory changes on the CT portion of the examination to strongly suggest an active acute diverticulitis at this time. No discrete lesion to suggest neoplasm. 3. Previously noted 8 mm lingular nodule is stable in size and known remains non hypermetabolic. This is nonspecific but reassuring. Continued  attention on future followup studies is recommended. 4. Aortic atherosclerosis, in addition to left main and 3 vessel coronary artery disease. Status post median sternotomy for CABG including LIMA to the LAD. In addition, there is mild fusiform aneurysmal dilatation of the infrarenal abdominal aorta (3.1 cm in diameter) as well as aneurysmal dilatation of the left common iliac      12/18/2016 -  Chemotherapy    Restarted nivolamab 297m every 2 weeks, with tapering dose dexamethasone         History of present illness (09/02/2015): Pt is a 76y.o. Retired dPharmacist, communitywith history of HTN, OSA, CKD, PPM who was admitted on 08/22/15 with expressive difficulty. He was found to have left frontal hemorrhage 5.7 cm x 4.5 cm with 6 mm left to right shift.During the workup for his stroke, a left upper lung mass was noticed on the CT scan. I was called to evaluate his lung mass.  Patient has aphasia, not able to communicate much. According to his daughter, Dr. LGala Romney who is a OB GYN at [employer] he probably only smoked for very short period time during the college. He did have 3 skin melanoma, which was removed, last one was 2-3 years ago. No other cancer history.  CURRENT THERAPY:   1.Supportive care and dexamethasone, tapering dose, currently 1 mg every other day for 2 weeks then stopped by 01/29/17 2. Restarted nivolamab on 12/18/2016 and changed to every 4 weeks on 01/30/2017  INTERVAL HISTORY:  Mr. HDurisreturns for follow-up with his daughter. Overall, things are going well for him. He reports that he has been experiencing some weakness into his bilateral lower legs and he has noticed that his right foot has been more "red" in the past 2-3 weeks. He notes some surrounding pain to the area and he reports that there is some mild bleeding from the area. His pain to the area is worse with ambulation.He is not currently followed by a Podiatrist. His daughter does report that an OT has been  working with him. They were previously concerned with his reaction times and they stopped letting him drive at that time. He finishes his Prednisone taper today. He has not noticed any acute changes besides his lower leg weakness acutely since beginning the taper. He denies loss of appetite, headaches, or any other associated symptoms.   REVIEW OF SYSTEMS:  Constitutional: Denies fevers, chills or abnormal weight loss Eyes: Denies blurriness of vision Ears, nose, mouth, throat, and face: Denies mucositis or sore throat Respiratory: Denies cough, dyspnea or wheezes Cardiovascular: Denies palpitation, chest discomfort or lower extremity swelling Gastrointestinal:  Denies nausea, heartburn, hematochezia  Skin: Negative. Lymphatics: Denies new lymphadenopathy or easy bruising Neurological:see HPI  Behavioral/Psych: Mood is stable, no new changes (+) trouble processing short and long term memory All other systems were reviewed with the patient and are negative.  MEDICAL HISTORY:  Past Medical History:  Diagnosis Date  . Anginal pain ([place]   . Arthritis    mostly hands  . Benign familial hematuria   . BPH (benign  prostatic hypertrophy)   . Brain cancer ([place])    melanoma with brain met  . Coronary artery disease   . GERD (gastroesophageal reflux disease)   . High cholesterol   . Hypertension   . Hypothyroidism   . Intracerebral hemorrhage ([place])   . Melanoma ([place])   . Metastatic melanoma to lung ([place])   . Mood disorder ([place])   . Myocardial infarction ([place])   . Pacemaker    due to syncope, 3rd degree HB (upgrade to [facility]. Jude CRT-P 02/25/13 (Dr. Uvaldo Rising)  . Rectal bleed    due to NSAIDS  . Renal disorder   . Skin cancer    melanoma  . Sleep apnea    uses CPAP  . Stroke [facility])     SURGICAL HISTORY: Past Surgical History:  Procedure Laterality Date  . APPLICATION OF CRANIAL NAVIGATION N/A 10/15/2015   Procedure: APPLICATION OF CRANIAL NAVIGATION;  Surgeon: Erline Levine, MD;   Location: [place] NEURO ORS;  Service: Neurosurgery;  Laterality: N/A;  . CARDIAC CATHETERIZATION  2013  . CARDIAC SURGERY     bypass X 2  . COLONOSCOPY WITH PROPOFOL N/A 05/08/2016   Procedure: COLONOSCOPY WITH PROPOFOL;  Surgeon: Jonathon Bellows, MD;  Location: [facility] ENDOSCOPY;  Service: Gastroenterology;  Laterality: N/A;  . CORONARY ARTERY BYPASS GRAFT  2013   LIMA-LAD, SVG-PDA 03/27/11 (Dr. Francee Gentile)  . CRANIOTOMY N/A 10/15/2015   Procedure: LEFT FRONTAL CRANIOTOMY TUMOR EXCISION with Curve;  Surgeon: Erline Levine, MD;  Location: [place] NEURO ORS;  Service: Neurosurgery;  Laterality: N/A;  CRANIOTOMY TUMOR EXCISION  . EYE SURGERY    . FOOT SURGERY  08/2010  . JOINT REPLACEMENT Left    partial knee  . KNEE ARTHROSCOPY  12/2008  . LYMPH NODE BIOPSY    . PACEMAKER INSERTION    . TONSILLECTOMY      I have reviewed the social history and family history with the patient and they are unchanged from previous note.  ALLERGIES:  is allergic to zocor [simvastatin].  MEDICATIONS:  Current Outpatient Prescriptions  Medication Sig Dispense Refill  . aspirin EC 81 MG tablet Take 81 mg by mouth daily.    . carvedilol (COREG) 6.25 MG tablet Take 6.25 mg by mouth 2 (two) times daily with a meal.    . dexamethasone (DECADRON) 1 MG tablet Take 2 tablets (2 mg total) by mouth daily with breakfast. 60 tablet 0  . feeding supplement, ENSURE ENLIVE, (ENSURE ENLIVE) LIQD Take 237 mLs by mouth 2 (two) times daily between meals.    Marland Kitchen levothyroxine (SYNTHROID, LEVOTHROID) 175 MCG tablet Take 175 mcg by mouth daily before breakfast.    . pentoxifylline (TRENTAL) 400 MG CR tablet Take 1 tablet (400 mg total) by mouth 2 (two) times daily. 60 tablet 6  . rosuvastatin (CRESTOR) 10 MG tablet Take 10 mg by mouth at bedtime.    . sertraline (ZOLOFT) 50 MG tablet Take 100 mg by mouth daily.     Marland Kitchen testosterone cypionate (DEPOTESTOTERONE CYPIONATE) 100 MG/ML injection Inject 100 mg into the muscle every 14 (fourteen) days.     .  vitamin E (VITAMIN E) 400 UNIT capsule Take 1 capsule (400 Units total) by mouth 2 (two) times daily. 60 capsule 6   No current facility-administered medications for this visit.    Facility-Administered Medications Ordered in Other Visits  Medication Dose Route Frequency Provider Last Rate Last Dose  . 0.9 %  sodium chloride infusion  1,000 mL Intravenous Once [name], MD  PHYSICAL EXAMINATION:  ECOG PERFORMANCE STATUS: 1 BP (!) 139/94 (BP Location: Right Arm, Patient Position: Sitting)   Pulse 82   Temp 97.7 F (36.5 C) (Oral)   Resp 20   Ht 5' 9.5" (1.765 m)   Wt 204 lb 12.8 oz (92.9 kg)   SpO2 96%   BMI 29.81 kg/m    GENERAL:alert, no distress and comfortable SKIN: Significant skin thickening below his right big toe with mild ecchymosis. Other skin color, texture, turgor are normal.  EYES: normal, Conjunctiva are pink and non-injected, sclera clear OROPHARYNX:no exudate, no erythema and lips, buccal mucosa, and tongue normal  NECK: supple, thyroid normal size, non-tender, without nodularity LYMPH:  no palpable lymphadenopathy in the cervical, [place], axillary or inguinal LUNGS: clear to auscultation and percussion with normal breathing effort HEART: regular rate & rhythm and no murmurs and no lower extremity edema ABDOMEN:abdomen soft, non-tender and normal bowel sounds Musculoskeletal:no cyanosis of digits and no clubbing  NEURO: alert & oriented x 3 with fluent speech, he walks independently, gait is normal. RLE strength is 4/5. All other extremities are 5/5.  LABORATORY DATA:  I have reviewed the data as listed CBC Latest Ref Rng & Units 01/30/2017 01/15/2017 01/01/2017  WBC 4.0 - 10.3 10e3/uL 5.7 5.0 5.1  Hemoglobin 13.0 - 17.1 g/dL 14.9 15.0 15.0  Hematocrit 38.4 - 49.9 % 42.7 43.9 44.7  Platelets 140 - 400 10e3/uL 170 159 140     CMP Latest Ref Rng & Units 01/30/2017 01/15/2017 01/01/2017  Glucose 70 - 140 mg/dl 146(H) 90 146(H)  BUN 7.0 - 26.0 mg/dL 29.1(H)  36.8(H) 39.2(H)  Creatinine 0.7 - 1.3 mg/dL 1.5(H) 1.2 1.4(H)  Sodium 136 - 145 mEq/L 139 140 143  Potassium 3.5 - 5.1 mEq/L 4.6 4.5 4.1  Chloride 101 - 111 mmol/L - - -  CO2 22 - 29 mEq/L 22 23 30(H)  Calcium 8.4 - 10.4 mg/dL 9.7 9.4 9.7  Total Protein 6.4 - 8.3 g/dL 6.6 6.8 6.9  Total Bilirubin 0.20 - 1.20 mg/dL 0.56 0.53 0.64  Alkaline Phos 40 - 150 U/L 47 42 43  AST 5 - 34 U/L _0 ALT 0 - 55 U/L _1 Results for KUPER, RENNELS (MRN [id number]) as of 11/27/2016 13:46  Ref. Range 08/23/2016 10:16 09/06/2016 08:51 09/20/2016 09:51 10/19/2016 09:25  TSH Latest Ref Range: 0.320 - 4.118 m(IU)/L 13.758 (H) 2.376 0.941 5.431 (H)  PENDING for 01/15/17  PATHOLOGY REPORT: Diagnosis 09/21/2015 Lymph node, needle/core biopsy, hypermetabolic left sub clavicular LN - METASTATIC MALIGNANT MELANOMA. - SEE COMMENT.  Diagnosis 10/15/2015 Brain, for tumor resection, Left frontal - METASTATIC MELANOMA.     PROCEDURES: Colonoscopy 05/08/16 - Stool in the ascending colon. Fluid aspiration performed. - Diverticulosis in the entire examined colon. - Non-bleeding internal hemorrhoids. - The examination was otherwise normal on direct and retroflexion views.  RADIOGRAPHIC STUDIES: I have personally reviewed the radiological images as listed and agreed with the findings in the report. No results found.   CT Head with and without contrast 11/27/2016 IMPRESSION: 1. Continued evolution of post radiation changes in the left frontal lobe with further decrease in the area of contrast enhancement and adjacent edema. 2. No new contrast-enhancing lesions.  PET 10/18/16 IMPRESSION: 1. New focal hypermetabolism in the proximal sigmoid colon with associated focal colonic wall thickening and mild pericolonic fat stranding at the site of a proximal sigmoid diverticulum, favored to represent mild acute sigmoid diverticulitis. Metastatic disease is considered less likely.  Clinical correlation is  necessary. Consider appropriate antibiotic therapy, as clinically warranted. Attention to this region recommended on future PET-CT follow-up. 2. Otherwise complete metabolic response with no evidence of hypermetabolic metastatic disease. Lingular pulmonary nodule is slightly decreased in size and demonstrates no significant metabolism. 3. No residual hypermetabolism in the right thyroid lobe nodule, which is stable in size. 4. Aortic Atherosclerosis (ICD10-I70.0). Stable 3.1 cm infrarenal abdominal aortic aneurysm. Aortic aneurysm NOS (ICD10-I71.9). Recommend followup by ultrasound in 3 years. This recommendation follows ACR consensus guidelines: White Paper of the ACR Incidental Findings Committee II on Vascular Findings. J Am Coll Radiol 2013;   CT Head W and WO Contrast 10/05/16 IMPRESSION: 1. Decreased size of the enhancing area within the left frontal lobe treatment site. Surrounding edema has also decreased. The findings support continued evolution of radiation necrosis. 2. No new enhancing lesions.  CT Head 09/02/16 IMPRESSION: Somewhat mixed pattern of slight posterior progression of pleomorphic enhancement surrounding the LEFT lateral ventricle although no significant mass effect, and reduced vasogenic edema compared with the scan in April.  CT Head W WO Contrast 08-15-2016 IMPRESSION: Pronounced change in the left frontal region. Much more regional vasogenic edema. Relatively stable appearance of the abnormal enhancement extending from the lateral margin of the frontal horn of left lateral ventricle to the frontal brain surface. New region of abnormal enhancement surrounding the frontal horn of the left lateral ventricle measuring approximately 3.2 cm. The differential diagnosis is recurrent tumor versus radiation necrosis. The patient is reportedly not an MRI candidate. This area is large enough that CT perfusion could possibly make a reasonable differentiation.  PET  Image Restage 05/29/16 IMPRESSION: 1. Stable exam with no evidence progression. 2. Photopenia in the LEFT frontal lobe at prior surgical site. 3. Stable mildly hypermetabolic RIGHT paratracheal node. This may represent a thyroid nodule. 4. Stable size of minimally metabolic lingular pulmonary nodule. 5. Stable skeletal lesion of the RIGHT iliac crest 6. No cutaneous metabolic lesion.   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   1. Metastatic melanoma to lung, node, bone and brain, BRAF V600K mutation (+) -I previously  reviewed his PET/CT scan images with pt and his family members. The scan revealed hypermetabolic left upper lobe lung mass, 2 small right lung nodules, probable bone metastasis, and a subcutaneous left supraclavicular lesion, which is not palpable  -His repeat CT had showed a 2.0 cm left frontal hyperdense lesion with enhancement on contrast, in the area of his recent hemorrhagic stroke, highly suspicious for brain metastasis.  -We previously discussed his left supraclavicular node biopsy results, which showed metastatic melanoma.. -His case was discussed in our sarcoma tumor board. -I previously recommended a first-line treatment with Nivolumab alone, given his recent stroke, low tumor burden. I'll reserve BRAF inhibitor with or without MEK inhibitor as a second line therapy if he progress on immunotherapy. Other options of immunotherapy, especially checkpoint inhibitor with ipilumimab, were also discussed with pt and his daughter -He is on first line therapy with Nivolumab, has been tolerating Nivolumab well, no significant side effects except fatigue. It was held from 09/06/16 to 12/18/2016 due to his chronic steroids use -He has had near complete metabolic response on the recent restaging PET scan in July 2018. -We'll continue Delton See  monthly for his bone metastasis. -He has recently developed neurological symptoms secondary to his radionecrosis in May 2018, on tapering dose  of dexa  -He tried dabrafenib and trametinib when he was off Nivo but tolerated poorly, stopped on 11/01/2016. -PET can 12/13/16 shows no evidence of disease. He has achieved a radiographic complete response.  -He has restarted Nivolamab.  I plan to continue Nivolumab for total of 2 years if he remains to be NED on scan. We will start him on this q4weeks.  -Labs reviewed and adequate to continue nivolamab.  -Finish tapering steroids today, he has tolerated this well.  -I again encouraged him to be active with walking or the gym since he has completed physical therapy, his daughter has been encouraging to use his pool in his facility.  -repeat PET in 17mo we will order this at his next appointment   2. Hemorrhagic stroke and brain metastasis  -I encouraged him to continue physical therapy and occupational therapy. 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 However his clinical neurological function has deteriorated -I have previously discussed with our neuro-oncologist Dr. 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 VMickeal Skinner his radionecrosis has improved, no other new lesions. He was also seen by Dr. VMickeal Skinnerwho feels he is recovering well from neurological standpoint -I'll continue tapering his steroids, he is on '1mg'$  dialy alternatively dose schedule, we'll change to 1 mg every other day for 2 weeks then stop  altogether by 01/29/17 -I will refer him to neoro-oncologist Dr. VMickeal Skinnerto closely follow him for his brain metastases, he will order repeated brain MRI  3. 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 VMickeal Skinnerto discuss treatment options to improve his neurologic function, patient is very frustrated about this, which significantly impacting his quality of life. -continue PT/OT  -CT Head 11/27/2016 showed continued evolution of post radiation changes in the left frontal lobe with further decrease in the area of contrast enhancement and adjacent edema. No new contrast-enhancing lesions.  -The patient previously reviewed these results with neuro-oncologist, Dr. VMickeal Skinner  4.  Hypercalcemia and AKI  -he was found to have calcium 13.4 and Cr 2.0 on his office visit 11/01/2016 -Possibly related to his underlying malignancy (bone mets) and missing one dose of Xgeva  -He has restarted Xgeva monthly on 11/10/16. Calcium has been normal   5. Fatigue, hypothyroidism and hypotestosteronemia -He has baseline hypothyroidism and hypotestosteronemia -His fatigue has much improved since he restarted testosterone injection by his primary care physician -He will follow-up with his primary care physician Dr. RAsher Muirfor his Synthroid dose adjustment  -His TSH was slightly In November 2017. TSH 9.6 on 06/15/16. -TSH 25.01 on 08/10/16. TSH 5.431 on 10/19/16.  -The patient will continue with light exercise as he is able. -I again encouraged him to be active -I encouraged him to continue outpt physical therapy  6. Anemia and history of GI bleeding  -his anemia  has resolved now -I previously encouraged him to continue oral iron supplement for a total of 3  months  -Anemia has been stable and resolved lately   7. HTN  -he is on amlodipine and carvedilol, follow-up of his primary care physician -Due to drug interaction with new oral chemo he will need to monitor his BP more often.   8. Depression  -he is on zoloft 21m daily, management by his PCP  -improved overall   9. CKD stage III -His Cr still slightly elevated -I previously encouraged him to drink water adequately -avoid NSAIDs  10. Goal of care discussion  -We previously discussed the incurable nature of his metastatic melanoma, and the overall poor prognosis, especially if he does not have good response to chemotherapy or progress on chemo -The patient understands the goal of care is palliative. -I recommend DNR/DNI, he will think about it   11. CAD -history of 2 double vessel bypass -He has pace maker -per pt (her daughter) request, he has switched his cardiologist to Dr. BHaroldine Laws  12. [place] Call[place] of the right foot  -We will refer to podiatry for further workup and management of this.   Plan -continue Nivolamab today, and change to every 4 weeks -he will stop Dexa after last dose tomorrow  -Lab, f/u and Nivolumab in 4 weeks  -[place]iatry referral in [place] -consult with Dr VMickeal Skinnerthis week  -flu shot today  All questions were answered. The patient knows to call the clinic with any problems, questions or concerns. No barriers to learning was detected.  I spent 20 minutes counseling the patient face to face. The total time spent in the appointment was 25 minutes and more than 50% was on counseling and review of test results.  This document serves as a record of services personally performed by YTruitt Merle MD. It was created on her behalf by WReola Mosher a trained medical scribe. The creation of this record is based on the scribe's personal observations and the provider's statements to them. This document has been checked and approved by the attending  provider.   FTruitt Merle MD 01/30/17

## 2017-01-30 NOTE — Telephone Encounter (Signed)
Scheduled appt per 10/23 los - Gave patient AVS and calender per los.

## 2017-01-31 ENCOUNTER — Encounter: Payer: Self-pay | Admitting: Hematology

## 2017-02-01 ENCOUNTER — Telehealth: Payer: Self-pay | Admitting: Internal Medicine

## 2017-02-01 ENCOUNTER — Encounter: Payer: Self-pay | Admitting: Internal Medicine

## 2017-02-01 ENCOUNTER — Ambulatory Visit (HOSPITAL_BASED_OUTPATIENT_CLINIC_OR_DEPARTMENT_OTHER): Payer: Medicare Other | Admitting: Internal Medicine

## 2017-02-01 VITALS — BP 149/83 | HR 108 | Temp 98.8°F | Resp 18 | Ht 69.5 in | Wt 194.4 lb

## 2017-02-01 DIAGNOSIS — C7931 Secondary malignant neoplasm of brain: Secondary | ICD-10-CM

## 2017-02-01 DIAGNOSIS — C439 Malignant melanoma of skin, unspecified: Secondary | ICD-10-CM | POA: Diagnosis not present

## 2017-02-01 DIAGNOSIS — R4183 Borderline intellectual functioning: Secondary | ICD-10-CM

## 2017-02-01 NOTE — Telephone Encounter (Signed)
Patient declined AVS. Gave patient calendar of upcoming December appointment.

## 2017-02-01 NOTE — Progress Notes (Signed)
[facility] at [facility], [place] [address] [phone]   New Patient Evaluation  Date of Service: 02/01/17 Patient Name: Leibish Mcgregor Patient MRN: [id number] Patient DOB: Aug 13, 1940 Provider: Ventura Sellers, MD  Identifying Statement:  Zelig Gacek is a 76 y.o. male with Brain metastasis ([place]) [C79.31] who presents for initial consultation and evaluation regarding cancer associated neurologic deficits.    Referring Provider: [name], MD [address] [place], [place] [address]  Primary Cancer: Melanoma Stage IV  Oncologic History: Oncology History   Metastatic melanoma [facility])   Staging form: Melanoma of the Skin, AJCC 7th Edition     Clinical stage from 09/21/2015: Stage IV [place], [place], [place]) - Signed by [name], MD on 10/09/2015       Metastatic melanoma ([place])   09/16/2015 Imaging    PET scan showed a hypermetabolic 7.3XT lingular nodule, and a 24m right upper lobe lung nodule, left subclavicular nodes, and bone metastasis in the proximal right humerus and right femur.       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 A new 6 mm hypodensity in the right cerebellar hemisphere with uncertainty enhancement may represent a metastasis or small brain parenchymal hemorrhage.       01/27/2016 - 01/27/2016 Radiation Therapy    01/27/16 SRS Treatment:  20 Gy in 1 fraction to a 6 mm right cerebellar brain met       03/29/2016 PET scan    PET 03/29/2016 IMPRESSION: 1. Overall there has been an interval response to therapy. The index lesions sided on previous exam it are less hypermetabolic when compared with the previous exam. 2. There is an intramuscular lesion within the right masseter which has increased FDG uptake compared with previous exam. 3. No new sites of disease identified. 4. Aortic atherosclerosis and infrarenal abdominal aortic aneurysm. Recommend followup by ultrasound in 3 years. This recommendation follows ACR consensus guidelines: White Paper of the ACR Incidental Findings Committee II on Vascular Findings. J Joellyn Ruedadiol 2013; 10:789-794      04/20/2016 Imaging    CT Head w wo contrast 1. Mild patchy enhancement at the left anterior frontal lobe treatment site (series 11, image 39) without mass effect and stable surrounding white  matter hypodensity without strong evidence of acute vasogenic edema. Recommend continued CT surveillance. 2. No new metastatic disease or new intracranial abnormality identified. 3. Probable benign 14 mm left parietal bone lucent lesion, unchanged since May 2017.      05/06/2016 - 05/08/2016 Hospital Admission    Pt was admitted for rectal bleeding for one day. Breathing spontaneously resolved, hemoglobin dropped from 15 to 10.5, did not require blood transfusion. Colonoscopy showed no active  bleeding, except diverticulosis and hemorrhoid. He was discharged to home.      05/08/2016 Procedure    Colonoscopy  - Stool in the ascending colon. Fluid aspiration performed. - Diverticulosis in the entire examined colon. - Non-bleeding internal hemorrhoids. - The examination was otherwise normal on direct and retroflexion views.      05/29/2016 Imaging    PET Image Restaging IMPRESSION: 1. Stable exam with no evidence progression. 2. Photopenia in the LEFT frontal lobe at prior surgical site. 3. Stable mildly hypermetabolic RIGHT paratracheal node. This may represent a thyroid nodule. 4. Stable size of minimally metabolic lingular pulmonary nodule. 5. Stable skeletal lesion of the RIGHT iliac crest 6. No cutaneous metabolic lesion.      07/20/2016 Imaging    CT Head W WO Contrast IMPRESSION: Pronounced change in the left frontal region. Much more regional vasogenic edema. Relatively stable appearance of the abnormal enhancement extending from the lateral margin of the frontal horn of left lateral ventricle to the frontal brain surface. New region of abnormal enhancement surrounding the frontal horn of the left lateral ventricle measuring approximately 3.2 cm. The differential diagnosis is recurrent tumor versus radiation necrosis. The patient is reportedly not an MRI candidate. This area is large enough that CT perfusion could possibly make a reasonable differentiation.      09/02/2016 Imaging    CT Head 09/02/16 IMPRESSION: Somewhat mixed pattern of slight posterior progression of pleomorphic enhancement surrounding the LEFT lateral ventricle although no significant mass effect, and reduced vasogenic edema compared with the scan in April.      09/27/2016 - 11/02/2016 Chemotherapy    Start dabrafenib and trametinib 09/27/16, stopped due to severe fatigue        10/05/2016 Imaging    CT Head w & w/o contrast  IMPRESSION: 1. Decreased size of the enhancing area within  the left frontal lobe treatment site. Surrounding edema has also decreased. The findings support continued evolution of radiation necrosis. 2. No new enhancing lesions.      10/18/2016 PET scan    IMPRESSION: 1. New focal hypermetabolism in the proximal sigmoid colon with associated focal colonic wall thickening and mild pericolonic fat stranding at the site of a proximal sigmoid diverticulum, favored to represent mild acute sigmoid diverticulitis. Metastatic disease is considered less likely. Clinical correlation is necessary. Consider appropriate antibiotic therapy, as clinically warranted. Attention to this region recommended on future PET-CT follow-up. 2. Otherwise complete metabolic response with no evidence of hypermetabolic metastatic disease. Lingular pulmonary nodule is slightly decreased in size and demonstrates no significant metabolism. 3. No residual hypermetabolism in the right thyroid lobe nodule, which is stable in size. 4. Aortic Atherosclerosis (ICD10-I70.0). Stable 3.1 cm infrarenal abdominal aortic aneurysm. Aortic aneurysm NOS (ICD10-I71.9). Recommend followup by ultrasound in 3 years. This recommendation follows ACR consensus guidelines: White Paper of the ACR Incidental Findings Committee II on Vascular Findings. Joellyn Rued Radiol 2013; 70:488-891.      11/01/2016 - 11/04/2016 Hospital Admission    Patient admitted to the hospital due to hypercalcemia  where he received IV fluids and pamidronate      12/13/2016 PET scan    PET 12/13/16 IMPRESSION: 1. No findings to suggest metastatic disease on today's examination. 2. Previously noted hypermetabolism in the sigmoid colon has resolved, presumably indicative of a focus of diverticulitis on the prior study. Today's study does demonstrate relatively diffuse hypermetabolism throughout the cecum, ascending colon and proximal transverse colon where there is some mild mural thickening, favored to reflect mild chronic  inflammation related to underlying diverticular disease. No definite inflammatory changes on the CT portion of the examination to strongly suggest an active acute diverticulitis at this time. No discrete lesion to suggest neoplasm. 3. Previously noted 8 mm lingular nodule is stable in size and known remains non hypermetabolic. This is nonspecific but reassuring. Continued attention on future followup studies is recommended. 4. Aortic atherosclerosis, in addition to left main and 3 vessel coronary artery disease. Status post median sternotomy for CABG including LIMA to the LAD. In addition, there is mild fusiform aneurysmal dilatation of the infrarenal abdominal aorta (3.1 cm in diameter) as well as aneurysmal dilatation of the left common iliac      12/18/2016 -  Chemotherapy    Restarted nivolamab 238m every 2 weeks, with tapering dose dexamethasone         History of Present Illness: The patient's records from the referring physician were obtained and reviewed and the patient interviewed to confirm this HPI.  CManraj Yeopresents today to evaluate his melanoma brain metastases and their accompanying neurologic complications.  His main complaints are "slow thinking", "poor short term memory" and right leg weakness.  All of these problems had been present prior, but he worsened significant in May 2018 associated with radiation necrosis and worsened left frontal edema.  Since that time he has improved, but not back to his pre-necrosis level.  Currently, he requires some assistance for living.  He is able to drive a golf cart short distances but not a car.  He walks on his own despite his weakness, and only experiences fatigue when walking multiple blocks.  He acknowledges slowing of his mentation, slower thinking and delayed recall from prior.  Outpatient OT has been performing MOCA tests, recently he scored 21/30.  Decadron has been weaned off as of last week, without complication. He  otherwise denies seizures, headaches, neck or back pain.     Medications: Current Outpatient Prescriptions on File Prior to Visit  Medication Sig Dispense Refill  . aspirin EC 81 MG tablet Take 81 mg by mouth daily.    . carvedilol (COREG) 6.25 MG tablet Take 6.25 mg by mouth 2 (two) times daily with a meal.    . levothyroxine (SYNTHROID, LEVOTHROID) 125 MCG tablet Take 1 tablet (125 mcg total) by mouth daily. 60 tablet 0  . pentoxifylline (TRENTAL) 400 MG CR tablet Take 1 tablet (400 mg total) by mouth 2 (two) times daily. 60 tablet 6  . rosuvastatin (CRESTOR) 10 MG tablet Take 10 mg by mouth at bedtime.    . sertraline (ZOLOFT) 50 MG tablet Take 100 mg by mouth daily.     . vitamin E (VITAMIN E) 400 UNIT capsule Take 1 capsule (400 Units total) by mouth 2 (two) times daily. 60 capsule 6  . dexamethasone (DECADRON) 1 MG tablet Take 2 tablets (2 mg total) by mouth daily with breakfast. (Patient not taking: Reported on 02/01/2017) 60 tablet 0  . feeding supplement, ENSURE ENLIVE, (ENSURE ENLIVE) LIQD Take 237 mLs by mouth  2 (two) times daily between meals. (Patient not taking: Reported on 02/01/2017)    . testosterone cypionate (DEPOTESTOTERONE CYPIONATE) 100 MG/ML injection Inject 100 mg into the muscle every 14 (fourteen) days.      Current Facility-Administered Medications on File Prior to Visit  Medication Dose Route Frequency Provider Last Rate Last Dose  . 0.9 %  sodium chloride infusion  1,000 mL Intravenous Once [name], MD        Allergies:  Allergies  Allergen Reactions  . Zocor [Simvastatin] Other (See Comments)    Reaction:  Gave pt a fever    Past Medical History:  Past Medical History:  Diagnosis Date  . Anginal pain ([place])   . Arthritis    mostly hands  . Benign familial hematuria   . BPH (benign prostatic hypertrophy)   . Brain cancer ([place])    melanoma with brain met  . Coronary artery disease   . GERD (gastroesophageal reflux disease)   . High cholesterol   .  Hypertension   . Hypothyroidism   . Intracerebral hemorrhage ([place])   . Melanoma ([place])   . Metastatic melanoma to lung ([place])   . Mood disorder ([place])   . Myocardial infarction ([place])   . Pacemaker    due to syncope, 3rd degree HB (upgrade to [facility]. Jude CRT-P 02/25/13 (Dr. Uvaldo Rising)  . Rectal bleed    due to NSAIDS  . Renal disorder   . Skin cancer    melanoma  . Sleep apnea    uses CPAP  . Stroke [facility])    Past Surgical History:  Past Surgical History:  Procedure Laterality Date  . APPLICATION OF CRANIAL NAVIGATION N/A 10/15/2015   Procedure: APPLICATION OF CRANIAL NAVIGATION;  Surgeon: Erline Levine, MD;  Location: [facility] NEURO ORS;  Service: Neurosurgery;  Laterality: N/A;  . CARDIAC CATHETERIZATION  2013  . CARDIAC SURGERY     bypass X 2  . COLONOSCOPY WITH PROPOFOL N/A 05/08/2016   Procedure: COLONOSCOPY WITH PROPOFOL;  Surgeon: Jonathon Bellows, MD;  Location: [facility] ENDOSCOPY;  Service: Gastroenterology;  Laterality: N/A;  . CORONARY ARTERY BYPASS GRAFT  2013   LIMA-LAD, SVG-PDA 03/27/11 (Dr. Francee Gentile)  . CRANIOTOMY N/A 10/15/2015   Procedure: LEFT FRONTAL CRANIOTOMY TUMOR EXCISION with Curve;  Surgeon: Erline Levine, MD;  Location: [facility] NEURO ORS;  Service: Neurosurgery;  Laterality: N/A;  CRANIOTOMY TUMOR EXCISION  . EYE SURGERY    . FOOT SURGERY  08/2010  . JOINT REPLACEMENT Left    partial knee  . KNEE ARTHROSCOPY  12/2008  . LYMPH NODE BIOPSY    . PACEMAKER INSERTION    . TONSILLECTOMY     Social History:  Social History   Social History  . Marital status: Married    Spouse name: N/A  . Number of children: 2  . Years of education: N/A   Occupational History  . retired Pharmacist, community    Social History Main Topics  . Smoking status: Former Smoker    Packs/day: 1.00    Years: 3.00    Quit date: 04/10/1957  . Smokeless tobacco: Never Used  . Alcohol use 24.0 oz/week    30 Glasses of wine, 10 Cans of beer per week     Comment: 10 per week, wine per week  . Drug use: No  . Sexual activity:  No   Other Topics Concern  . Not on file   Social History Narrative   Wife has dementia   Daughter [name] and son  Has living will   [name] is health care POA   Requests DNR-- done 09/14/15   Not sure about tube feeds   Family History:  Family History  Problem Relation Age of Onset  . Cancer Mother 76       lung   . Hypertension Mother   . Hypertension Father   . Heart attack Father   . Heart disease Father   . Rheum arthritis Sister     Review of Systems: Constitutional: Denies fevers, chills or abnormal weight loss Eyes: Denies blurriness of vision Ears, nose, mouth, throat, and face: Denies mucositis or sore throat Respiratory: Denies cough, dyspnea or wheezes Cardiovascular: Denies palpitation, chest discomfort or lower extremity swelling Gastrointestinal:  Denies nausea, constipation, diarrhea GU: Denies dysuria or incontinence Skin: Denies abnormal skin rashes Neurological: Per HPI Musculoskeletal: Denies joint pain, back or neck discomfort. No decrease in ROM Behavioral/Psych: Denies anxiety, disturbance in thought content, and mood instability   Physical Exam: Vitals:   02/01/17 1308  BP: (!) 149/83  Pulse: (!) 108  Resp: 18  Temp: 98.8 F (37.1 C)  SpO2: 97%   KPS: 70. General: Alert, cooperative, pleasant, in no acute distress Head: Craniotomy scar noted, dry and intact. EENT: No conjunctival injection or scleral icterus. Oral mucosa moist Lungs: Resp effort normal Cardiac: Regular rate and rhythm Abdomen: Soft, non-distended abdomen Skin: No rashes cyanosis or petechiae. Extremities: No clubbing or edema  Neurologic Exam: Mental Status: Awake, alert, attentive to examiner. Oriented to self and environment. Language is fluent with intact comprehension.  Age advanced psychomotor slowing.  Limited insight at times.  Cranial Nerves: Visual acuity is grossly normal. Visual fields are full. Extra-ocular movements intact. No  ptosis. Face is symmetric, tongue midline. Motor: Tone and bulk are normal. Power is impaired to 4/5 right leg, flexors>extendors. Reflexes are symmetric, no pathologic reflexes present. Intact finger to nose bilaterally Sensory: Intact to light touch and temperature Gait: Hemiparetic   Labs: I have reviewed the data as listed    Component Value Date/Time   NA 139 01/30/2017 1005   K 4.6 01/30/2017 1005   CL 105 11/04/2016 0723   CO2 22 01/30/2017 1005   GLUCOSE 146 (H) 01/30/2017 1005   BUN 29.1 (H) 01/30/2017 1005   CREATININE 1.5 (H) 01/30/2017 1005   CALCIUM 9.7 01/30/2017 1005   PROT 6.6 01/30/2017 1005   ALBUMIN 3.5 01/30/2017 1005   AST 16 01/30/2017 1005   ALT 13 01/30/2017 1005   ALKPHOS 47 01/30/2017 1005   BILITOT 0.56 01/30/2017 1005   GFRNONAA 43 (L) 11/04/2016 0723   GFRAA 50 (L) 11/04/2016 0723   Lab Results  Component Value Date   WBC 5.7 01/30/2017   NEUTROABS 4.5 01/30/2017   HGB 14.9 01/30/2017   HCT 42.7 01/30/2017   MCV 92.0 01/30/2017   PLT 170 01/30/2017    Imaging:  CLINICAL DATA:  Melanoma and brain metastases. Status post brain surgery and radiation therapy. Currently undergoing immunotherapy.  EXAM: CT HEAD WITHOUT AND WITH CONTRAST  TECHNIQUE: Contiguous axial images were obtained from the base of the skull through the vertex without and with intravenous contrast  CONTRAST:  34m ISOVUE-300 IOPAMIDOL (ISOVUE-300) INJECTION 61%  COMPARISON:  Head CT 10/05/2016 and 09/02/2016  FINDINGS: Brain: The area of cortical enhancements in the left frontal lobe has decreased compared to the prior study. There are no new areas of contrast enhancement. No acute hemorrhage. No midline shift or mass effect. Hypoattenuation in the left frontal  white matter is slightly decreased. Persistent ex vacuo dilatation of the left lateral ventricle.  Vascular: No hyperdense vessel or unexpected calcification.  Skull: Remote left frontal  craniotomy.  Sinuses/Orbits: No sinus fluid levels or advanced mucosal thickening. No mastoid effusion. Normal orbits.  IMPRESSION: 1. Continued evolution of post radiation changes in the left frontal lobe with further decrease in the area of contrast enhancement and adjacent edema. 2. No new contrast-enhancing lesions.   Electronically Signed   By: Ulyses Jarred M.D.   On: 11/27/2016 14:20  [place] Clinician Interpretation: I have personally reviewed the radiological images as listed.  My interpretation, in the context of the patient's clinical presentation, is stable disease   Assessment/Plan 1. Brain metastases ([place])  2. Cognitive Impairment  Mr. Jeffries is clinically stable today.  We reviewed extensively his prior treatment, imaging, and the context of his cognitive and motor deficits.    We do not recommend re-initiating steroids.  He may continue Vit. E and Trental as prior.    He should continue to stay active and rehab aggressively.    At this time we recommend returning to clinic in 4-6 weeks with a repeat CT head with and without contrast for evaluation.  The case at that time will be reviewed again in multidisciplinary brain tumor board.   He will continue to follow with Dr. Burr Medico for his systemic therapy, currently Nivolumab.     We spent twenty additional minutes teaching regarding the natural history, biology, and historical experience in the treatment of neurologic complications of cancer. We also provided teaching sheets for the patient to take home as an additional resource.  We appreciate the opportunity to participate in the care of [name].   All questions were answered. The patient knows to call the clinic with any problems, questions or concerns. No barriers to learning were detected.  The total time spent in the encounter was 60 minutes and more than 50% was on counseling and review of test results   Ventura Sellers, MD Medical Director of  Neuro-Oncology [facility] [place] 02/01/17 2:26 PM

## 2017-02-07 ENCOUNTER — Telehealth: Payer: Self-pay | Admitting: Hematology

## 2017-02-07 NOTE — Telephone Encounter (Signed)
02/07/2017 @ 3:59 pm called and spoke with wife, [name], regarding forms for driving from the [facility] that patient dropped off.  Informed her that Dr. Burr Medico filled out her portion, but there were still other disciplines that needed to fill out their portion (ie ophthalmologist).  [name] requested that I send forms to [place] [place], [place] [id number]

## 2017-02-12 ENCOUNTER — Other Ambulatory Visit: Payer: Medicare Other

## 2017-02-12 ENCOUNTER — Ambulatory Visit: Payer: Medicare Other | Admitting: Hematology

## 2017-02-12 ENCOUNTER — Ambulatory Visit: Payer: Medicare Other

## 2017-02-12 ENCOUNTER — Encounter: Payer: Self-pay | Admitting: Podiatry

## 2017-02-12 ENCOUNTER — Ambulatory Visit (INDEPENDENT_AMBULATORY_CARE_PROVIDER_SITE_OTHER): Payer: Medicare Other | Admitting: Podiatry

## 2017-02-12 VITALS — BP 159/101 | HR 75 | Resp 16

## 2017-02-12 DIAGNOSIS — I25810 Atherosclerosis of coronary artery bypass graft(s) without angina pectoris: Secondary | ICD-10-CM | POA: Diagnosis not present

## 2017-02-12 DIAGNOSIS — M1990 Unspecified osteoarthritis, unspecified site: Secondary | ICD-10-CM | POA: Insufficient documentation

## 2017-02-12 DIAGNOSIS — M1710 Unilateral primary osteoarthritis, unspecified knee: Secondary | ICD-10-CM

## 2017-02-12 DIAGNOSIS — Z96629 Presence of unspecified artificial elbow joint: Secondary | ICD-10-CM | POA: Insufficient documentation

## 2017-02-12 DIAGNOSIS — L97511 Non-pressure chronic ulcer of other part of right foot limited to breakdown of skin: Secondary | ICD-10-CM | POA: Diagnosis not present

## 2017-02-12 MED ORDER — MUPIROCIN 2 % EX OINT: TOPICAL_OINTMENT | CUTANEOUS | 2 refills | Status: DC

## 2017-02-12 NOTE — Progress Notes (Signed)
Subjective:  Patient ID: David [name], male    DOB: November 17, 1940,  MRN: [id number] HPI Chief Complaint  Patient presents with  . Foot Pain    Sub 1st MPJ right - callused, pre-ulcerative lesion x several weeks, [facility] doc sent here for evaluation since more tender, bleeds some, tries to not walk barefoot which helps    76 y.o. male presents with the above complaint.     Past Medical History:  Diagnosis Date  . Anginal pain ([place])   . Arthritis    mostly hands  . Benign familial hematuria   . BPH (benign prostatic hypertrophy)   . Brain cancer ([place])    melanoma with brain met  . Coronary artery disease   . GERD (gastroesophageal reflux disease)   . High cholesterol   . Hypertension   . Hypothyroidism   . Intracerebral hemorrhage ([place])   . Melanoma ([place])   . Metastatic melanoma to lung ([place])   . Mood disorder ([place])   . Myocardial infarction ([place])   . Pacemaker    due to syncope, 3rd degree HB (upgrade to [facility]. Jude CRT-P 02/25/13 (Dr. Uvaldo Rising)  . Rectal bleed    due to NSAIDS  . Renal disorder   . Skin cancer    melanoma  . Sleep apnea    uses CPAP  . Stroke [facility]    Past Surgical History:  Procedure Laterality Date  . CARDIAC CATHETERIZATION  2013  . CARDIAC SURGERY     bypass X 2  . CORONARY ARTERY BYPASS GRAFT  2013   LIMA-LAD, SVG-PDA 03/27/11 (Dr. Francee Gentile)  . EYE SURGERY    . FOOT SURGERY  08/2010  . JOINT REPLACEMENT Left    partial knee  . KNEE ARTHROSCOPY  12/2008  . LYMPH NODE BIOPSY    . PACEMAKER INSERTION    . TONSILLECTOMY      Current Outpatient Medications:  .  aspirin EC 81 MG tablet, Take 81 mg by mouth daily., Disp: , Rfl:  .  carvedilol (COREG) 6.25 MG tablet, Take 6.25 mg by mouth 2 (two) times daily with a meal., Disp: , Rfl:  .  dexamethasone (DECADRON) 1 MG tablet, Take 2 tablets (2 mg total) by mouth daily with breakfast. (Patient not taking: Reported on 02/01/2017), Disp: 60 tablet, Rfl: 0 .  feeding supplement, ENSURE ENLIVE,  (ENSURE ENLIVE) LIQD, Take 237 mLs by mouth 2 (two) times daily between meals. (Patient not taking: Reported on 02/01/2017), Disp: , Rfl:  .  levothyroxine (SYNTHROID, LEVOTHROID) 175 MCG tablet, , Disp: , Rfl:  .  metoprolol succinate (TOPROL-XL) 25 MG 24 hr tablet, , Disp: , Rfl:  .  pentoxifylline (TRENTAL) 400 MG CR tablet, Take 1 tablet (400 mg total) by mouth 2 (two) times daily., Disp: 60 tablet, Rfl: 6 .  rosuvastatin (CRESTOR) 10 MG tablet, Take 10 mg by mouth at bedtime., Disp: , Rfl:  .  sertraline (ZOLOFT) 100 MG tablet, , Disp: , Rfl:  .  testosterone cypionate (DEPOTESTOTERONE CYPIONATE) 100 MG/ML injection, Inject 100 mg into the muscle every 14 (fourteen) days. , Disp: , Rfl:  .  vitamin E (VITAMIN E) 400 UNIT capsule, Take 1 capsule (400 Units total) by mouth 2 (two) times daily., Disp: 60 capsule, Rfl: 6 No current facility-administered medications for this visit.   Facility-Administered Medications Ordered in Other Visits:  .  0.9 %  sodium chloride infusion, 1,000 mL, Intravenous, Once, [name], MD  Allergies  Allergen Reactions  . Zocor [Simvastatin]  Other (See Comments)    Reaction:  Gave pt a fever    Review of Systems  Constitutional: Positive for activity change and fatigue.  Musculoskeletal: Positive for arthralgias and gait problem.  Neurological: Positive for dizziness and weakness.   Objective:   Vitals:   02/12/17 1050  BP: (!) 159/101  Pulse: 75  Resp: 16    General: Well developed, nourished, in no acute distress, alert and oriented x3   Dermatological: Skin is warm, dry and supple bilateral. Nails x 10 are well maintained; remaining integument appears unremarkable at this time. There are no open sores, no preulcerative lesions, no rash or signs of infection present. Reactive hyperkeratotic lesion plantar aspect first metatarsal right foot demonstrates ulceration was debrided. The actual ulceration measures less than a centimeter in diameter there  is bleeding present. It does not probe to bone.    Vascular: Dorsalis Pedis artery and Posterior Tibial artery pedal pulses are 2/4 bilateral with immedate capillary fill time. Pedal hair growth present. No varicosities and no lower extremity edema present bilateral.   Neruologic: Grossly intact via light touch bilateral. Vibratory intact via tuning fork bilateral. Protective threshold with Semmes Wienstein monofilament intact to all pedal sites bilateral. Patellar and Achilles deep tendon reflexes 2+ bilateral. No Babinski or clonus noted bilateral.   Musculoskeletal: No gross boney pedal deformities bilateral. No pain, crepitus, or limitation noted with foot and ankle range of motion bilateral. Muscular strength 5/5 in all groups tested bilateral. After reconstruction of his right foot many years ago he's been left with a plantarflexed first metatarsal. This is resulting in skin breakdown.  Gait: Unassisted, Nonantalgic.    Radiographs:    Assessment & Plan:   Assessment: Ulceration sub-first metatarsal right foot.  Plan: Debrided ulcerative wound today placed him on Bactroban ointment to be applied daily. Demonstrated padding to offload. I also recommended that he follow-up with Liliane Channel for orthotics or some type of offloading device.     Max T. [place], [place]

## 2017-02-23 NOTE — Progress Notes (Signed)
[place]  Telephone:(336) [phone] Fax:(336) [phone]  Clinic Follow up Note   Patient Care Team: [name], MD as PCP - General (Internal Medicine) 02/26/2017  Chief complaint: Follow-up metastatic melanoma  Oncology History   Metastatic melanoma [facility])   Staging form: Melanoma of the Skin, AJCC 7th Edition     Clinical stage from 09/21/2015: Stage IV [place], [place], [place]) - Signed by [name], MD on 10/09/2015       Malignant melanoma ([place])   09/16/2015 Imaging    PET scan showed a hypermetabolic 6.9GE lingular nodule, and a 91m right upper lobe lung nodule, left subclavicular nodes, and bone metastasis in the proximal right humerus and right femur.       09/21/2015 Initial Diagnosis    Metastatic melanoma ([place]      09/21/2015 Initial Biopsy    Left subclavicular lymph node biopsy showed prostatic of melanoma      09/21/2015 Miscellaneous    BRAF V600K mutation (+)       10/04/2015 Imaging    CT head with without contrast showed a 2.0 x 1.6 x 1.7 cm superficial left frontal hyperdense lesion, with postcontrast enhancement, lying within the previous hemorrhagic area, concerning for solitary melanoma metastasis      10/06/2015 - 09/06/2016 Chemotherapy    Nivolumab 2473mevery 2 weeks, held due to his dexamethasone for radionecrosis       10/13/2015 - 10/13/2015 Radiation Therapy    10/13/2015 Preop SRS treatment: 14 Gy  to the left frontal lesion in 1 fraction      10/15/2015 Surgery    left front craniotomy and tumor excision       10/15/2015 Pathology Results    left frontal brain mass excision showed metastatic melanoma, 1.5X1.5X0.6cm      01/13/2016 Imaging    CT head without and with contrast showed ill-defined enhancement and dural thickening of the left frontal resection cavity may represent a post treatment changes or residual neoplasm. A new 6 mm hypodensity in the right cerebellar hemisphere with uncertainty enhancement may represent a metastasis or  small brain parenchymal hemorrhage.       01/27/2016 - 01/27/2016 Radiation Therapy    01/27/16 SRS Treatment:  20 Gy in 1 fraction to a 6 mm right cerebellar brain met       03/29/2016 PET scan    PET 03/29/2016 IMPRESSION: 1. Overall there has been an interval response to therapy. The index lesions sided on previous exam it are less hypermetabolic when compared with the previous exam. 2. There is an intramuscular lesion within the right masseter which has increased FDG uptake compared with previous exam. 3. No new sites of disease identified. 4. Aortic atherosclerosis and infrarenal abdominal aortic aneurysm. Recommend followup by ultrasound in 3 years. This recommendation follows ACR consensus guidelines: White Paper of the ACR Incidental Findings Committee II on Vascular Findings. J Joellyn Ruedadiol 2013; 10:789-794      04/20/2016 Imaging    CT Head w wo contrast 1. Mild patchy enhancement at the left anterior frontal lobe treatment site (series 11, image 39) without mass effect and stable surrounding white matter hypodensity without strong evidence of acute vasogenic edema. Recommend continued CT surveillance. 2. No new metastatic disease or new intracranial abnormality identified. 3. Probable benign 14 mm left parietal bone lucent lesion, unchanged since May 2017.      05/06/2016 - 05/08/2016 Hospital Admission    Pt was admitted for rectal bleeding for one day. Breathing spontaneously  resolved, hemoglobin dropped from 15 to 10.5, did not require blood transfusion. Colonoscopy showed no active bleeding, except diverticulosis and hemorrhoid. He was discharged to home.      05/08/2016 Procedure    Colonoscopy  - Stool in the ascending colon. Fluid aspiration performed. - Diverticulosis in the entire examined colon. - Non-bleeding internal hemorrhoids. - The examination was otherwise normal on direct and retroflexion views.      05/29/2016 Imaging    PET Image  Restaging IMPRESSION: 1. Stable exam with no evidence progression. 2. Photopenia in the LEFT frontal lobe at prior surgical site. 3. Stable mildly hypermetabolic RIGHT paratracheal node. This may represent a thyroid nodule. 4. Stable size of minimally metabolic lingular pulmonary nodule. 5. Stable skeletal lesion of the RIGHT iliac crest 6. No cutaneous metabolic lesion.      07/20/2016 Imaging    CT Head W WO Contrast IMPRESSION: Pronounced change in the left frontal region. Much more regional vasogenic edema. Relatively stable appearance of the abnormal enhancement extending from the lateral margin of the frontal horn of left lateral ventricle to the frontal brain surface. New region of abnormal enhancement surrounding the frontal horn of the left lateral ventricle measuring approximately 3.2 cm. The differential diagnosis is recurrent tumor versus radiation necrosis. The patient is reportedly not an MRI candidate. This area is large enough that CT perfusion could possibly make a reasonable differentiation.      09/02/2016 Imaging    CT Head 09/02/16 IMPRESSION: Somewhat mixed pattern of slight posterior progression of pleomorphic enhancement surrounding the LEFT lateral ventricle although no significant mass effect, and reduced vasogenic edema compared with the scan in April.      09/27/2016 - 11/02/2016 Chemotherapy    Start dabrafenib and trametinib 09/27/16, stopped due to severe fatigue        10/05/2016 Imaging    CT Head w & w/o contrast  IMPRESSION: 1. Decreased size of the enhancing area within the left frontal lobe treatment site. Surrounding edema has also decreased. The findings support continued evolution of radiation necrosis. 2. No new enhancing lesions.      10/18/2016 PET scan    IMPRESSION: 1. New focal hypermetabolism in the proximal sigmoid colon with associated focal colonic wall thickening and mild pericolonic fat stranding at the site of a proximal  sigmoid diverticulum, favored to represent mild acute sigmoid diverticulitis. Metastatic disease is considered less likely. Clinical correlation is necessary. Consider appropriate antibiotic therapy, as clinically warranted. Attention to this region recommended on future PET-CT follow-up. 2. Otherwise complete metabolic response with no evidence of hypermetabolic metastatic disease. Lingular pulmonary nodule is slightly decreased in size and demonstrates no significant metabolism. 3. No residual hypermetabolism in the right thyroid lobe nodule, which is stable in size. 4. Aortic Atherosclerosis (ICD10-I70.0). Stable 3.1 cm infrarenal abdominal aortic aneurysm. Aortic aneurysm NOS (ICD10-I71.9). Recommend followup by ultrasound in 3 years. This recommendation follows ACR consensus guidelines: White Paper of the ACR Incidental Findings Committee II on Vascular Findings. Joellyn Rued Radiol 2013; 83:662-947.      11/01/2016 - 11/04/2016 Hospital Admission    Patient admitted to the hospital due to hypercalcemia where he received IV fluids and pamidronate      12/13/2016 PET scan    PET 12/13/16 IMPRESSION: 1. No findings to suggest metastatic disease on today's examination. 2. Previously noted hypermetabolism in the sigmoid colon has resolved, presumably indicative of a focus of diverticulitis on the prior study. Today's study does demonstrate relatively diffuse hypermetabolism throughout the  cecum, ascending colon and proximal transverse colon where there is some mild mural thickening, favored to reflect mild chronic inflammation related to underlying diverticular disease. No definite inflammatory changes on the CT portion of the examination to strongly suggest an active acute diverticulitis at this time. No discrete lesion to suggest neoplasm. 3. Previously noted 8 mm lingular nodule is stable in size and known remains non hypermetabolic. This is nonspecific but reassuring. Continued  attention on future followup studies is recommended. 4. Aortic atherosclerosis, in addition to left main and 3 vessel coronary artery disease. Status post median sternotomy for CABG including LIMA to the LAD. In addition, there is mild fusiform aneurysmal dilatation of the infrarenal abdominal aorta (3.1 cm in diameter) as well as aneurysmal dilatation of the left common iliac      12/18/2016 -  Chemotherapy    Restarted nivolamab 239m every 2 weeks, with tapering dose dexamethasone         History of present illness (09/02/2015): Pt is a 76y.o. Retired dPharmacist, communitywith history of HTN, OSA, CKD, PPM who was admitted on 08/22/15 with expressive difficulty. He was found to have left frontal hemorrhage 5.7 cm x 4.5 cm with 6 mm left to right shift.During the workup for his stroke, a left upper lung mass was noticed on the CT scan. I was called to evaluate his lung mass.  Patient has aphasia, not able to communicate much. According to his daughter, Dr. LGala Romney who is a OB GYN at [employer] he probably only smoked for very short period time during the college. He did have 3 skin melanoma, which was removed, last one was 2-3 years ago. No other cancer history.  CURRENT THERAPY:   1.Supportive care and dexamethasone, tapering dose, currently 1 mg every other day for 2 weeks then stopped by 01/29/17 2. Restarted nivolamab on 12/18/2016 and changed to every 4 weeks on 01/30/2017  INTERVAL HISTORY:   Mr. HVinsantreturns for follow-up. She presents to the clinic today noting he does exercise 3 times a week and can tolerate the 30 minutes each. He has been off steroids since last visit. He transitioned well. He saw podiatrist, no surgery is needed. His feet feel better since. He saw Dr. VMickeal Skinnerand will repeat Brain scan. He has a helper at the living community. He gets helps with his medication. He reviewed his medication list.    REVIEW OF SYSTEMS:   Constitutional: Denies fevers, chills or  abnormal weight loss Eyes: Denies blurriness of vision Ears, nose, mouth, throat, and face: Denies mucositis or sore throat Respiratory: Denies cough, dyspnea or wheezes Cardiovascular: Denies palpitation, chest discomfort or lower extremity swelling Gastrointestinal:  Denies nausea, heartburn, hematochezia  Skin: Negative. Lymphatics: Denies new lymphadenopathy or easy bruising Neurological:see HPI  Behavioral/Psych: Mood is stable, no new changes (+) trouble processing short and long term memory All other systems were reviewed with the patient and are negative.  MEDICAL HISTORY:  Past Medical History:  Diagnosis Date  . Anginal pain ([place]   . Arthritis    mostly hands  . Benign familial hematuria   . BPH (benign prostatic hypertrophy)   . Brain cancer ([place]    melanoma with brain met  . Coronary artery disease   . GERD (gastroesophageal reflux disease)   . High cholesterol   . Hypertension   . Hypothyroidism   . Intracerebral hemorrhage ([place]   . Melanoma ([place]   . Metastatic melanoma to lung ([place]   . Mood disorder ([place]   .  Myocardial infarction ([place])   . Pacemaker    due to syncope, 3rd degree HB (upgrade to [facility]. Jude CRT-P 02/25/13 (Dr. Uvaldo Rising)  . Rectal bleed    due to NSAIDS  . Renal disorder   . Skin cancer    melanoma  . Sleep apnea    uses CPAP  . Stroke [facility])     SURGICAL HISTORY: Past Surgical History:  Procedure Laterality Date  . APPLICATION OF CRANIAL NAVIGATION N/A 10/15/2015   Performed by Erline Levine, MD at [facility] NEURO ORS  . CARDIAC CATHETERIZATION  2013  . CARDIAC SURGERY     bypass X 2  . COLONOSCOPY WITH PROPOFOL N/A 05/08/2016   Performed by Jonathon Bellows, MD at [facility]  . CORONARY ARTERY BYPASS GRAFT  2013   LIMA-LAD, SVG-PDA 03/27/11 (Dr. Francee Gentile)  . EYE SURGERY    . FOOT SURGERY  08/2010  . JOINT REPLACEMENT Left    partial knee  . KNEE ARTHROSCOPY  12/2008  . LEFT FRONTAL CRANIOTOMY TUMOR EXCISION with Curve N/A 10/15/2015    Performed by Erline Levine, MD at [facility] NEURO ORS  . LYMPH NODE BIOPSY    . PACEMAKER INSERTION    . TONSILLECTOMY      I have reviewed the social history and family history with the patient and they are unchanged from previous note.  ALLERGIES:  is allergic to zocor [simvastatin].  MEDICATIONS:  Current Outpatient Medications  Medication Sig Dispense Refill  . aspirin EC 81 MG tablet Take 81 mg by mouth daily.    . carvedilol (COREG) 6.25 MG tablet Take 6.25 mg by mouth 2 (two) times daily with a meal.    . dexamethasone (DECADRON) 1 MG tablet Take 2 tablets (2 mg total) by mouth daily with breakfast. (Patient not taking: Reported on 02/01/2017) 60 tablet 0  . feeding supplement, ENSURE ENLIVE, (ENSURE ENLIVE) LIQD Take 237 mLs by mouth 2 (two) times daily between meals. (Patient not taking: Reported on 02/01/2017)    . levothyroxine (SYNTHROID, LEVOTHROID) 175 MCG tablet     . metoprolol succinate (TOPROL-XL) 25 MG 24 hr tablet     . mupirocin ointment (BACTROBAN) 2 % Apply to wound twice a day. 30 g 2  . pentoxifylline (TRENTAL) 400 MG CR tablet Take 1 tablet (400 mg total) by mouth 2 (two) times daily. 60 tablet 6  . rosuvastatin (CRESTOR) 10 MG tablet Take 10 mg by mouth at bedtime.    . sertraline (ZOLOFT) 100 MG tablet     . testosterone cypionate (DEPOTESTOTERONE CYPIONATE) 100 MG/ML injection Inject 100 mg into the muscle every 14 (fourteen) days.     . vitamin E (VITAMIN E) 400 UNIT capsule Take 1 capsule (400 Units total) by mouth 2 (two) times daily. 60 capsule 6   No current facility-administered medications for this visit.    Facility-Administered Medications Ordered in Other Visits  Medication Dose Route Frequency Provider Last Rate Last Dose  . 0.9 %  sodium chloride infusion  1,000 mL Intravenous Once [name], MD        PHYSICAL EXAMINATION:  ECOG PERFORMANCE STATUS: 1 BP 140/90 (BP Location: Left Arm, Patient Position: Sitting)   Pulse 79   Temp 98 F (36.7 C)  (Oral)   Resp 18   Ht 5' 9.5" (1.765 m)   Wt 207 lb 9.6 oz (94.2 kg)   SpO2 96%   BMI 30.22 kg/m    GENERAL:alert, no distress and comfortable SKIN: Significant skin thickening below his  right big toe with mild ecchymosis. Other skin color, texture, turgor are normal.  EYES: normal, Conjunctiva are pink and non-injected, sclera clear OROPHARYNX:no exudate, no erythema and lips, buccal mucosa, and tongue normal  NECK: supple, thyroid normal size, non-tender, without nodularity LYMPH:  no palpable lymphadenopathy in the cervical, [place], axillary or inguinal LUNGS: clear to auscultation and percussion with normal breathing effort HEART: regular rate & rhythm and no murmurs and no lower extremity edema ABDOMEN:abdomen soft, non-tender and normal bowel sounds Musculoskeletal:no cyanosis of digits and no clubbing  NEURO: alert & oriented x 3 with fluent speech, he walks independently, gait is normal. RLE strength is 4/5. All other extremities are 5/5.  LABORATORY DATA:  I have reviewed the data as listed CBC Latest Ref Rng & Units 02/26/2017 01/30/2017 01/15/2017  WBC 4.0 - 10.3 10e3/uL 4.2 5.7 5.0  Hemoglobin 13.0 - 17.1 g/dL 14.3 14.9 15.0  Hematocrit 38.4 - 49.9 % 43.1 42.7 43.9  Platelets 140 - 400 10e3/uL 164 170 159     CMP Latest Ref Rng & Units 02/26/2017 01/30/2017 01/15/2017  Glucose 70 - 140 mg/dl 104 146(H) 90  BUN 7.0 - 26.0 mg/dL 29.5(H) 29.1(H) 36.8(H)  Creatinine 0.7 - 1.3 mg/dL 1.3 1.5(H) 1.2  Sodium 136 - 145 mEq/L 143 139 140  Potassium 3.5 - 5.1 mEq/L 4.6 4.6 4.5  Chloride 101 - 111 mmol/L - - -  CO2 22 - 29 mEq/L _0 Calcium 8.4 - 10.4 mg/dL 8.9 9.7 9.4  Total Protein 6.4 - 8.3 g/dL 6.6 6.6 6.8  Total Bilirubin 0.20 - 1.20 mg/dL 0.45 0.56 0.53  Alkaline Phos 40 - 150 U/L 50 47 42  AST 5 - 34 U/L _1 ALT 0 - 55 U/L _2 Results for COPELAND, NEISEN (MRN [id number]) as of 02/23/2017 16:16  Ref. Range 10/19/2016 09:25 01/15/2017 11:30 01/30/2017  10:05  TSH Latest Ref Range: 0.320 - 4.118 m(IU)/L 5.431 (H) <0.080 (L) 0.091 (L)  PENDING 02/26/17  PATHOLOGY REPORT: Diagnosis 09/21/2015 Lymph node, needle/core biopsy, hypermetabolic left sub clavicular LN - METASTATIC MALIGNANT MELANOMA. - SEE COMMENT.  Diagnosis 10/15/2015 Brain, for tumor resection, Left frontal - METASTATIC MELANOMA.     PROCEDURES: Colonoscopy 05/08/16 - Stool in the ascending colon. Fluid aspiration performed. - Diverticulosis in the entire examined colon. - Non-bleeding internal hemorrhoids. - The examination was otherwise normal on direct and retroflexion views.  RADIOGRAPHIC STUDIES: I have personally reviewed the radiological images as listed and agreed with the findings in the report. No results found.   CT Head with and without contrast 11/27/2016 IMPRESSION: 1. Continued evolution of post radiation changes in the left frontal lobe with further decrease in the area of contrast enhancement and adjacent edema. 2. No new contrast-enhancing lesions.  PET 10/18/16 IMPRESSION: 1. New focal hypermetabolism in the proximal sigmoid colon with associated focal colonic wall thickening and mild pericolonic fat stranding at the site of a proximal sigmoid diverticulum, favored to represent mild acute sigmoid diverticulitis. Metastatic disease is considered less likely. Clinical correlation is necessary. Consider appropriate antibiotic therapy, as clinically warranted. Attention to this region recommended on future PET-CT follow-up. 2. Otherwise complete metabolic response with no evidence of hypermetabolic metastatic disease. Lingular pulmonary nodule is slightly decreased in size and demonstrates no significant metabolism. 3. No residual hypermetabolism in the right thyroid lobe nodule, which is stable in size. 4. Aortic Atherosclerosis (ICD10-I70.0). Stable 3.1 cm infrarenal abdominal aortic aneurysm. Aortic aneurysm NOS (ICD10-I71.9).  Recommend  followup by ultrasound in 3 years. This recommendation follows ACR consensus guidelines: White Paper of the ACR Incidental Findings Committee II on Vascular Findings. J Am Coll Radiol 2013;   CT Head W and WO Contrast 10/05/16 IMPRESSION: 1. Decreased size of the enhancing area within the left frontal lobe treatment site. Surrounding edema has also decreased. The findings support continued evolution of radiation necrosis. 2. No new enhancing lesions.  CT Head 09/02/16 IMPRESSION: Somewhat mixed pattern of slight posterior progression of pleomorphic enhancement surrounding the LEFT lateral ventricle although no significant mass effect, and reduced vasogenic edema compared with the scan in April.  CT Head W WO Contrast 27-Jul-2016 IMPRESSION: Pronounced change in the left frontal region. Much more regional vasogenic edema. Relatively stable appearance of the abnormal enhancement extending from the lateral margin of the frontal horn of left lateral ventricle to the frontal brain surface. New region of abnormal enhancement surrounding the frontal horn of the left lateral ventricle measuring approximately 3.2 cm. The differential diagnosis is recurrent tumor versus radiation necrosis. The patient is reportedly not an MRI candidate. This area is large enough that CT perfusion could possibly make a reasonable differentiation.  PET Image Restage 05/29/16 IMPRESSION: 1. Stable exam with no evidence progression. 2. Photopenia in the LEFT frontal lobe at prior surgical site. 3. Stable mildly hypermetabolic RIGHT paratracheal node. This may represent a thyroid nodule. 4. Stable size of minimally metabolic lingular pulmonary nodule. 5. Stable skeletal lesion of the RIGHT iliac crest 6. No cutaneous metabolic lesion.   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   1. Metastatic melanoma to lung, node, bone and brain, BRAF V600K mutation (+) -I previously  reviewed his PET/CT scan images with pt and his family members. The scan revealed hypermetabolic left upper lobe lung mass, 2 small right lung nodules, probable bone metastasis, and a subcutaneous left supraclavicular lesion, which is not palpable  -His repeat CT had showed a 2.0 cm left frontal hyperdense lesion with enhancement on contrast, in the area of his recent hemorrhagic stroke, highly suspicious for brain metastasis.  -We previously discussed his left supraclavicular node biopsy results, which showed metastatic melanoma.. -His case was discussed in our sarcoma tumor board. -I previously recommended a first-line treatment with Nivolumab alone, given his recent stroke, low tumor burden. I'll reserve BRAF inhibitor with or without MEK inhibitor as a second line therapy if he progress on immunotherapy. Other options of immunotherapy, especially checkpoint inhibitor with ipilumimab, were also discussed with pt and his daughter -He is on first line therapy with Nivolumab, has been tolerating Nivolumab well, no significant side effects except fatigue. It was held from 09/06/16 to 12/18/2016 due to his chronic steroids use -He has had near complete metabolic response on the recent restaging PET scan in July 2018. -We'll continue Delton See monthly for his bone metastasis. -He has recently developed neurological symptoms secondary to his radionecrosis in May 2018, on tapering dose of dexa  -He tried dabrafenib and trametinib when he was off Nivo but tolerated poorly, stopped on 11/01/2016. -PET can 12/13/16 shows no evidence of disease. He has achieved a radiographic complete response.  -He has restarted Nivolamab on 12/18/16 and changed to every 4 weeks on 01/30/17. I plan to continue Nivolumab for total of 2 years if he remains to be NED on scan -He finish tapering steroids on 10/32/18, he has tolerated  this well.  -I again encouraged him to be active with walking or the gym since he has completed physical therapy, his daughter has been encouraging to use his pool in his facility.  -Lab reviewed, Kidney function has improved and labs adequate to proceed with Nivolumab today and will continue every 4 weeks. Will check his thyroid level today, results pending.  -repeat PET in 04/2017 -f/u in 4 weeks    2. Hemorrhagic stroke and brain metastasis  -I encouraged him to continue physical therapy and occupational therapy. 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 However his clinical neurological function has deteriorated -I have previously discussed with our neuro-oncologist Dr. 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 Mickeal Skinner, his radionecrosis has improved, no other new lesions. He was also seen by Dr. Mickeal Skinner who feels he is recovering well from neurological standpoint -I'll continue tapering his steroids, he is on 41m dialy alternatively dose schedule, we'll change to 1 mg every other day for 2 weeks then stop altogether by 01/29/17 -He will f/u with Dr. VMickeal Skinner he will order repeated brain MRI/Scan in 03/2017  3. 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 VMickeal Skinnerto discuss  treatment options to improve his neurologic function, patient is very frustrated about this, which significantly impacting his quality of life. -continue PT/OT  -CT Head 11/27/2016 showed continued evolution of post radiation changes in the left frontal lobe with further decrease in the area of contrast enhancement and adjacent edema. No new contrast-enhancing lesions.  -The patient previously reviewed these results with neuro-oncologist, Dr. VMickeal Skinner  4.  Hypercalcemia and AKI  -he was found to have calcium 13.4 and Cr 2.0 on his office visit 11/01/2016 -Possibly related to his underlying malignancy (bone mets) and missing one dose of Xgeva  -He has restarted Xgeva monthly on 11/10/16. Calcium has been normal  -kidney function tests have improved as of 02/26/17  5. Fatigue, hypothyroidism and hypotestosteronemia -He has baseline hypothyroidism and hypotestosteronemia -His fatigue has much improved since he restarted testosterone injection by his primary care physician -He will follow-up with his primary care physician Dr. RAsher Muirfor his Synthroid dose adjustment  -His TSH was slightly In November 2017. TSH 9.6 on 06/15/16. -TSH 25.01 on 08/10/16. TSH 5.431 on 10/19/16.  -The patient will continue with light exercise as he is able. -I again encouraged him to be active -I encouraged him to continue outpt physical therapy -TSH <0.08 on 01/15/17 and 0.091 on 10/32/18   6. Anemia and history of GI bleeding  -his anemia has resolved now -I previously encouraged him to continue oral iron supplement for a total of 3 months  -Anemia has been stable and resolved lately   7. HTN  -he is on amlodipine and carvedilol, follow-up of his primary care physician -Due to drug  interaction with new oral chemo he will need to monitor his BP more often.   8. Depression  -he is on Zoloft 66m daily, management by his PCP  -improved overall   9. CKD stage III -His Cr still slightly elevated -I previously  encouraged him to drink water adequately -avoid NSAIDs  10. Goal of care discussion  -We previously discussed the incurable nature of his metastatic melanoma, and the overall poor prognosis, especially if he does not have good response to chemotherapy or progress on chemo -The patient understands the goal of care is palliative. -I recommend DNR/DNI, he will think about it   11. CAD -history of 2 double vessel bypass -He has pace maker -per pt (her daughter) request, he has switched his cardiologist to Dr. BHaroldine Laws  12. [place] Callus of the right foot  -He was referred to podiatry for further workup and management of this.  -He was seen by Podiatrist and symptoms have improved.   Plan -Labs reviewed, continue Nivolamab today, and every 4 weeks -Lab, f/u and Nivolumab in 4 weeks  -will check with his PCP about his cardiac medications   All questions were answered. The patient knows to call the clinic with any problems, questions or concerns. No barriers to learning was detected.  I spent 20 minutes counseling the patient face to face. The total time spent in the appointment was 25 minutes and more than 50% was on counseling and review of test results.  This document serves as a record of services personally performed by YTruitt Merle MD. It was created on her behalf by AJoslyn Devon a trained medical scribe. The creation of this record is based on the scribe's personal observations and the provider's statements to them.    I have reviewed the above documentation for accuracy and completeness, and I agree with the above.  FTruitt Merle MD 02/26/17

## 2017-02-25 DIAGNOSIS — G3189 Other specified degenerative diseases of nervous system: Secondary | ICD-10-CM | POA: Insufficient documentation

## 2017-02-25 DIAGNOSIS — S069X9S Unspecified intracranial injury with loss of consciousness of unspecified duration, sequela: Secondary | ICD-10-CM | POA: Insufficient documentation

## 2017-02-25 DIAGNOSIS — F09 Unspecified mental disorder due to known physiological condition: Secondary | ICD-10-CM | POA: Insufficient documentation

## 2017-02-25 DIAGNOSIS — R4189 Other symptoms and signs involving cognitive functions and awareness: Secondary | ICD-10-CM

## 2017-02-26 ENCOUNTER — Ambulatory Visit (HOSPITAL_BASED_OUTPATIENT_CLINIC_OR_DEPARTMENT_OTHER): Payer: Medicare Other

## 2017-02-26 ENCOUNTER — Encounter: Payer: Self-pay | Admitting: Hematology

## 2017-02-26 ENCOUNTER — Ambulatory Visit (HOSPITAL_BASED_OUTPATIENT_CLINIC_OR_DEPARTMENT_OTHER): Payer: Medicare Other | Admitting: Hematology

## 2017-02-26 ENCOUNTER — Other Ambulatory Visit (HOSPITAL_BASED_OUTPATIENT_CLINIC_OR_DEPARTMENT_OTHER): Payer: Medicare Other

## 2017-02-26 VITALS — BP 140/90 | HR 79 | Temp 98.0°F | Resp 18 | Ht 69.5 in | Wt 207.6 lb

## 2017-02-26 DIAGNOSIS — C7931 Secondary malignant neoplasm of brain: Secondary | ICD-10-CM | POA: Diagnosis not present

## 2017-02-26 DIAGNOSIS — C7800 Secondary malignant neoplasm of unspecified lung: Secondary | ICD-10-CM | POA: Diagnosis not present

## 2017-02-26 DIAGNOSIS — I10 Essential (primary) hypertension: Secondary | ICD-10-CM | POA: Diagnosis not present

## 2017-02-26 DIAGNOSIS — Z5112 Encounter for antineoplastic immunotherapy: Secondary | ICD-10-CM | POA: Diagnosis present

## 2017-02-26 DIAGNOSIS — E039 Hypothyroidism, unspecified: Secondary | ICD-10-CM | POA: Diagnosis not present

## 2017-02-26 DIAGNOSIS — C799 Secondary malignant neoplasm of unspecified site: Secondary | ICD-10-CM

## 2017-02-26 DIAGNOSIS — I619 Nontraumatic intracerebral hemorrhage, unspecified: Secondary | ICD-10-CM

## 2017-02-26 DIAGNOSIS — N183 Chronic kidney disease, stage 3 unspecified: Secondary | ICD-10-CM

## 2017-02-26 DIAGNOSIS — F329 Major depressive disorder, single episode, unspecified: Secondary | ICD-10-CM | POA: Diagnosis not present

## 2017-02-26 DIAGNOSIS — Z8673 Personal history of transient ischemic attack (TIA), and cerebral infarction without residual deficits: Secondary | ICD-10-CM

## 2017-02-26 DIAGNOSIS — C770 Secondary and unspecified malignant neoplasm of lymph nodes of head, face and neck: Secondary | ICD-10-CM

## 2017-02-26 DIAGNOSIS — C439 Malignant melanoma of skin, unspecified: Secondary | ICD-10-CM | POA: Diagnosis not present

## 2017-02-26 DIAGNOSIS — E291 Testicular hypofunction: Secondary | ICD-10-CM

## 2017-02-26 DIAGNOSIS — R5383 Other fatigue: Secondary | ICD-10-CM

## 2017-02-26 DIAGNOSIS — R4189 Other symptoms and signs involving cognitive functions and awareness: Secondary | ICD-10-CM

## 2017-02-26 DIAGNOSIS — C7951 Secondary malignant neoplasm of bone: Secondary | ICD-10-CM | POA: Diagnosis not present

## 2017-02-26 DIAGNOSIS — I2510 Atherosclerotic heart disease of native coronary artery without angina pectoris: Secondary | ICD-10-CM

## 2017-02-26 DIAGNOSIS — R413 Other amnesia: Secondary | ICD-10-CM | POA: Diagnosis not present

## 2017-02-26 DIAGNOSIS — L84 Corns and callosities: Secondary | ICD-10-CM | POA: Diagnosis not present

## 2017-02-26 LAB — COMPREHENSIVE METABOLIC PANEL
ALK PHOS: 50 U/L (ref 40–150)
ALT: 16 U/L (ref 0–55)
AST: 18 U/L (ref 5–34)
Albumin: 3.4 g/dL — ABNORMAL LOW (ref 3.5–5.0)
Anion Gap: 9 mEq/L (ref 3–11)
BUN: 29.5 mg/dL — AB (ref 7.0–26.0)
CALCIUM: 8.9 mg/dL (ref 8.4–10.4)
CHLORIDE: 113 meq/L — AB (ref 98–109)
CO2: 22 mEq/L (ref 22–29)
Creatinine: 1.3 mg/dL (ref 0.7–1.3)
EGFR: 51 mL/min/{1.73_m2} — AB (ref >60)
GLUCOSE: 104 mg/dL (ref 70–140)
POTASSIUM: 4.6 meq/L (ref 3.5–5.1)
SODIUM: 143 meq/L (ref 136–145)
Total Bilirubin: 0.45 mg/dL (ref 0.20–1.20)
Total Protein: 6.6 g/dL (ref 6.4–8.3)

## 2017-02-26 LAB — CBC WITH DIFFERENTIAL/PLATELET
BASO%: 0.2 % (ref 0.0–2.0)
Basophils Absolute: 0 10*3/uL (ref 0.0–0.1)
EOS ABS: 0.3 10*3/uL (ref 0.0–0.5)
EOS%: 8.2 % — ABNORMAL HIGH (ref 0.0–7.0)
HEMATOCRIT: 43.1 % (ref 38.4–49.9)
HGB: 14.3 g/dL (ref 13.0–17.1)
LYMPH#: 0.7 10*3/uL — AB (ref 0.9–3.3)
LYMPH%: 16.3 % (ref 14.0–49.0)
MCH: 31.3 pg (ref 27.2–33.4)
MCHC: 33.2 g/dL (ref 32.0–36.0)
MCV: 94.3 fL (ref 79.3–98.0)
MONO#: 0.4 10*3/uL (ref 0.1–0.9)
MONO%: 10.6 % (ref 0.0–14.0)
NEUT%: 64.7 % (ref 39.0–75.0)
NEUTROS ABS: 2.7 10*3/uL (ref 1.5–6.5)
PLATELETS: 164 10*3/uL (ref 140–400)
RBC: 4.57 10*6/uL (ref 4.20–5.82)
RDW: 13.2 % (ref 11.0–14.6)
WBC: 4.2 10*3/uL (ref 4.0–10.3)

## 2017-02-26 LAB — TSH: TSH: <0.08 m(IU)/L — ABNORMAL LOW (ref 0.320–4.118)

## 2017-02-26 MED ORDER — SODIUM CHLORIDE 0.9 % IV SOLN
480.0000 mg | Freq: Once | INTRAVENOUS | Status: AC
  Administered 2017-02-26: 480 mg via INTRAVENOUS
  Filled 2017-02-26: qty 48

## 2017-02-26 MED ORDER — SODIUM CHLORIDE 0.9 % IV SOLN
Freq: Once | INTRAVENOUS | Status: AC
  Administered 2017-02-26: 11:00:00 via INTRAVENOUS

## 2017-02-26 NOTE — Patient Instructions (Signed)
[facility] Discharge Instructions for Patients Receiving Chemotherapy  Today you received the following chemotherapy agents: Nivolumab  To help prevent nausea and vomiting after your treatment, we encourage you to take your nausea medication as directed.    If you develop nausea and vomiting that is not controlled by your nausea medication, call the clinic.   BELOW ARE SYMPTOMS THAT SHOULD BE REPORTED IMMEDIATELY:  *FEVER GREATER THAN 100.5 F  *CHILLS WITH OR WITHOUT FEVER  NAUSEA AND VOMITING THAT IS NOT CONTROLLED WITH YOUR NAUSEA MEDICATION  *UNUSUAL SHORTNESS OF BREATH  *UNUSUAL BRUISING OR BLEEDING  TENDERNESS IN MOUTH AND THROAT WITH OR WITHOUT PRESENCE OF ULCERS  *URINARY PROBLEMS  *BOWEL PROBLEMS  UNUSUAL RASH Items with * indicate a potential emergency and should be followed up as soon as possible.  Feel free to call the clinic should you have any questions or concerns. The clinic phone number is [phone].  Please show the CHEMO ALERT CARD at check-in to the Emergency Department and triage nurse.   

## 2017-02-27 NOTE — Addendum Note (Signed)
Addended by: Elray Buba LE on: 02/27/2017 05:02 PM   Modules accepted: Orders

## 2017-03-05 ENCOUNTER — Encounter: Payer: Self-pay | Admitting: Podiatry

## 2017-03-05 ENCOUNTER — Ambulatory Visit (INDEPENDENT_AMBULATORY_CARE_PROVIDER_SITE_OTHER): Payer: Medicare Other | Admitting: Podiatry

## 2017-03-05 DIAGNOSIS — L97511 Non-pressure chronic ulcer of other part of right foot limited to breakdown of skin: Secondary | ICD-10-CM

## 2017-03-05 NOTE — Progress Notes (Signed)
This patient returns to the office follow-up for diagnosis of an ulcer under the big toe joint of the right foot. He was seen by Dr. Milinda Pointer approximately 3 weeks ago. He was treated with 2 debridement and padding.David [name]  He denies any drainage from the ulcer. He returns to the office today for reevaluation of the ulcer under the big toe joint, right foot.  General Appearance  Alert, conversant and in no acute stress.  Vascular  Dorsalis pedis and posterior pulses are palpable  bilaterally.  Capillary return is within normal limits  Bilaterally. Temperature is within normal limits  Bilaterally  Neurologic  [name]-Weinstein monofilament wire test within normal limits  bilaterally. Muscle power  Within normal limits bilaterally.  Nails Normotropic nails both feet .  Orthopedic  No limitations of motion of motion feet bilaterally.  No crepitus or effusions noted.  No bony pathology or digital deformities noted. Plantarflexed first metatarsal right foot.  Skin healing ulcer under the first MPJ of the right foot.  No evidence of any redness, swelling or infection.  No drainage noted.  Ulcer right foot secondary previous foot surgery.  ROV  Debridement of hyperkeratotic tissue at the site of ulcer right foot.  Padding was applied to his orthotic in his shoe.  This patient was told to make an appointment with Liliane Channel  To make new appliances.  RTC 4 weeks for ulcer care.   Gardiner Barefoot DPM

## 2017-03-07 ENCOUNTER — Ambulatory Visit: Payer: Medicare Other | Admitting: Orthotics

## 2017-03-07 DIAGNOSIS — L97511 Non-pressure chronic ulcer of other part of right foot limited to breakdown of skin: Secondary | ICD-10-CM

## 2017-03-07 NOTE — Progress Notes (Signed)
Patient was cast today for accommodative CMFO to address plantar ulcer 1st met head R.  Plan on soft f/o with dancers pad off load of 1st met head.   Self pay $300

## 2017-03-08 ENCOUNTER — Ambulatory Visit (HOSPITAL_COMMUNITY)
Admission: RE | Admit: 2017-03-08 | Discharge: 2017-03-08 | Disposition: A | Discharge status: Home / Self Care | Payer: Medicare Other | Source: Ambulatory Visit | Attending: Internal Medicine | Admitting: Internal Medicine

## 2017-03-08 ENCOUNTER — Encounter (HOSPITAL_COMMUNITY): Payer: Self-pay

## 2017-03-08 DIAGNOSIS — C801 Malignant (primary) neoplasm, unspecified: Secondary | ICD-10-CM | POA: Insufficient documentation

## 2017-03-08 DIAGNOSIS — C7931 Secondary malignant neoplasm of brain: Secondary | ICD-10-CM | POA: Diagnosis not present

## 2017-03-08 DIAGNOSIS — C719 Malignant neoplasm of brain, unspecified: Secondary | ICD-10-CM | POA: Diagnosis not present

## 2017-03-08 MED ORDER — IOPAMIDOL (ISOVUE-300) INJECTION 61%
INTRAVENOUS | Status: AC
  Administered 2017-03-08: 75 mL via INTRAVENOUS
  Filled 2017-03-08: qty 75

## 2017-03-08 MED ORDER — IOPAMIDOL (ISOVUE-300) INJECTION 61%
75.0000 mL | Freq: Once | INTRAVENOUS | Status: AC | PRN
  Administered 2017-03-08: 75 mL via INTRAVENOUS

## 2017-03-12 ENCOUNTER — Ambulatory Visit (HOSPITAL_BASED_OUTPATIENT_CLINIC_OR_DEPARTMENT_OTHER): Payer: Medicare Other | Admitting: Internal Medicine

## 2017-03-12 ENCOUNTER — Telehealth: Payer: Self-pay | Admitting: Internal Medicine

## 2017-03-12 ENCOUNTER — Encounter: Payer: Self-pay | Admitting: Internal Medicine

## 2017-03-12 VITALS — BP 135/79 | HR 96 | Temp 97.7°F | Resp 18 | Ht 68.5 in | Wt 208.8 lb

## 2017-03-12 DIAGNOSIS — C7931 Secondary malignant neoplasm of brain: Secondary | ICD-10-CM | POA: Diagnosis not present

## 2017-03-12 DIAGNOSIS — R413 Other amnesia: Secondary | ICD-10-CM

## 2017-03-12 DIAGNOSIS — C439 Malignant melanoma of skin, unspecified: Secondary | ICD-10-CM | POA: Diagnosis not present

## 2017-03-12 NOTE — Progress Notes (Signed)
[facility] [place], [place] [address] [phone]   Interval Evaluation  Date of Service: 03/12/17 Patient Name: David [name] Patient MRN: [id number] Patient DOB: [birth year] Provider: Ventura Sellers, MD  Identifying Statement:  David [name] is a 76 y.o. male with Metastasis to brain [facility]) [C79.31]   Primary Cancer: Melanoma Stage IV  Oncologic History: Oncology History   Metastatic melanoma [facility])   Staging form: Melanoma of the Skin, AJCC 7th Edition     Clinical stage from 09/21/2015: Stage IV [place] [place] [place]) - Signed by [name], MD on 10/09/2015       Malignant melanoma ([place])   09/16/2015 Imaging    PET scan showed a hypermetabolic 7.9TJ lingular nodule, and a 33m right upper lobe lung nodule, left subclavicular nodes, and bone metastasis in the proximal right humerus and right femur.       09/21/2015 Initial Diagnosis    Metastatic melanoma ([place]      09/21/2015 Initial Biopsy    Left subclavicular lymph node biopsy showed prostatic of melanoma      09/21/2015 Miscellaneous    BRAF V600K mutation (+)       10/04/2015 Imaging    CT head with without contrast showed a 2.0 x 1.6 x 1.7 cm superficial left frontal hyperdense lesion, with postcontrast enhancement, lying within the previous hemorrhagic area, concerning for solitary melanoma metastasis      10/06/2015 - 09/06/2016 Chemotherapy    Nivolumab 2437mevery 2 weeks, held due to his dexamethasone for radionecrosis       10/13/2015 - 10/13/2015 Radiation Therapy    10/13/2015 Preop SRS treatment: 14 Gy  to the left frontal lesion in 1 fraction      10/15/2015 Surgery    left front craniotomy and tumor excision       10/15/2015 Pathology Results    left frontal brain mass excision showed metastatic melanoma, 1.5X1.5X0.6cm      01/13/2016 Imaging    CT head without and with contrast showed ill-defined enhancement and dural thickening of the left frontal  resection cavity may represent a post treatment changes or residual neoplasm. A new 6 mm hypodensity in the right cerebellar hemisphere with uncertainty enhancement may represent a metastasis or small brain parenchymal hemorrhage.       01/27/2016 - 01/27/2016 Radiation Therapy    01/27/16 SRS Treatment:  20 Gy in 1 fraction to a 6 mm right cerebellar brain met       03/29/2016 PET scan    PET 03/29/2016 IMPRESSION: 1. Overall there has been an interval response to therapy. The index lesions sided on previous exam it are less hypermetabolic when compared with the previous exam. 2. There is an intramuscular lesion within the right masseter which has increased FDG uptake compared with previous exam. 3. No new sites of disease identified. 4. Aortic atherosclerosis and infrarenal abdominal aortic aneurysm. Recommend followup by ultrasound in 3 years. This recommendation follows ACR consensus guidelines: White Paper of the ACR Incidental Findings Committee II on Vascular Findings. J Joellyn Ruedadiol 2013; 10:789-794      04/20/2016 Imaging    CT Head w wo contrast 1. Mild patchy enhancement at the left anterior frontal lobe treatment site (series 11, image 39) without mass effect and stable surrounding white matter hypodensity without strong evidence of acute vasogenic edema. Recommend continued CT surveillance. 2. No new metastatic disease or new intracranial abnormality identified. 3. Probable benign 14 mm  left parietal bone lucent lesion, unchanged since May 2017.      05/06/2016 - 05/08/2016 Hospital Admission    Pt was admitted for rectal bleeding for one day. Breathing spontaneously resolved, hemoglobin dropped from 15 to 10.5, did not require blood transfusion. Colonoscopy showed no active bleeding, except diverticulosis and hemorrhoid. He was discharged to home.      05/08/2016 Procedure    Colonoscopy  - Stool in the ascending colon. Fluid aspiration performed. -  Diverticulosis in the entire examined colon. - Non-bleeding internal hemorrhoids. - The examination was otherwise normal on direct and retroflexion views.      05/29/2016 Imaging    PET Image Restaging IMPRESSION: 1. Stable exam with no evidence progression. 2. Photopenia in the LEFT frontal lobe at prior surgical site. 3. Stable mildly hypermetabolic RIGHT paratracheal node. This may represent a thyroid nodule. 4. Stable size of minimally metabolic lingular pulmonary nodule. 5. Stable skeletal lesion of the RIGHT iliac crest 6. No cutaneous metabolic lesion.      07/20/2016 Imaging    CT Head W WO Contrast IMPRESSION: Pronounced change in the left frontal region. Much more regional vasogenic edema. Relatively stable appearance of the abnormal enhancement extending from the lateral margin of the frontal horn of left lateral ventricle to the frontal brain surface. New region of abnormal enhancement surrounding the frontal horn of the left lateral ventricle measuring approximately 3.2 cm. The differential diagnosis is recurrent tumor versus radiation necrosis. The patient is reportedly not an MRI candidate. This area is large enough that CT perfusion could possibly make a reasonable differentiation.      09/02/2016 Imaging    CT Head 09/02/16 IMPRESSION: Somewhat mixed pattern of slight posterior progression of pleomorphic enhancement surrounding the LEFT lateral ventricle although no significant mass effect, and reduced vasogenic edema compared with the scan in April.      09/27/2016 - 11/02/2016 Chemotherapy    Start dabrafenib and trametinib 09/27/16, stopped due to severe fatigue        10/05/2016 Imaging    CT Head w & w/o contrast  IMPRESSION: 1. Decreased size of the enhancing area within the left frontal lobe treatment site. Surrounding edema has also decreased. The findings support continued evolution of radiation necrosis. 2. No new enhancing lesions.       10/18/2016 PET scan    IMPRESSION: 1. New focal hypermetabolism in the proximal sigmoid colon with associated focal colonic wall thickening and mild pericolonic fat stranding at the site of a proximal sigmoid diverticulum, favored to represent mild acute sigmoid diverticulitis. Metastatic disease is considered less likely. Clinical correlation is necessary. Consider appropriate antibiotic therapy, as clinically warranted. Attention to this region recommended on future PET-CT follow-up. 2. Otherwise complete metabolic response with no evidence of hypermetabolic metastatic disease. Lingular pulmonary nodule is slightly decreased in size and demonstrates no significant metabolism. 3. No residual hypermetabolism in the right thyroid lobe nodule, which is stable in size. 4. Aortic Atherosclerosis (ICD10-I70.0). Stable 3.1 cm infrarenal abdominal aortic aneurysm. Aortic aneurysm NOS (ICD10-I71.9). Recommend followup by ultrasound in 3 years. This recommendation follows ACR consensus guidelines: White Paper of the ACR Incidental Findings Committee II on Vascular Findings. Joellyn Rued Radiol 2013; 78:675-449.      11/01/2016 - 11/04/2016 Hospital Admission    Patient admitted to the hospital due to hypercalcemia where he received IV fluids and pamidronate      12/13/2016 PET scan    PET 12/13/16 IMPRESSION: 1. No findings to suggest metastatic disease  on today's examination. 2. Previously noted hypermetabolism in the sigmoid colon has resolved, presumably indicative of a focus of diverticulitis on the prior study. Today's study does demonstrate relatively diffuse hypermetabolism throughout the cecum, ascending colon and proximal transverse colon where there is some mild mural thickening, favored to reflect mild chronic inflammation related to underlying diverticular disease. No definite inflammatory changes on the CT portion of the examination to strongly suggest an active acute diverticulitis  at this time. No discrete lesion to suggest neoplasm. 3. Previously noted 8 mm lingular nodule is stable in size and known remains non hypermetabolic. This is nonspecific but reassuring. Continued attention on future followup studies is recommended. 4. Aortic atherosclerosis, in addition to left main and 3 vessel coronary artery disease. Status post median sternotomy for CABG including LIMA to the LAD. In addition, there is mild fusiform aneurysmal dilatation of the infrarenal abdominal aorta (3.1 cm in diameter) as well as aneurysmal dilatation of the left common iliac      12/18/2016 -  Chemotherapy    Restarted nivolamab 233m every 2 weeks, with tapering dose dexamethasone         Interval History:  CTrendon Zaringpresents today for follow up.  He has no new complaints today.  He does continue to feel weakness in his right leg, although this may have improved a little.  Otherwise, his short term memory impairment still persists, as prior.  Continues to undergo Nivolumab infusions q4 weeks. He otherwise denies seizures, headaches, neck or back pain.     Medications: Current Outpatient Medications on File Prior to Visit  Medication Sig Dispense Refill  . aspirin EC 81 MG tablet Take 81 mg by mouth daily.    . carvedilol (COREG) 6.25 MG tablet Take 6.25 mg by mouth daily.     .Marland Kitchenlevothyroxine (SYNTHROID, LEVOTHROID) 125 MCG tablet Take 125 mcg by mouth daily before breakfast.    . mupirocin ointment (BACTROBAN) 2 % Apply to wound twice a day. 30 g 2  . pentoxifylline (TRENTAL) 400 MG CR tablet Take 1 tablet (400 mg total) by mouth 2 (two) times daily. 60 tablet 6  . rosuvastatin (CRESTOR) 10 MG tablet Take 10 mg by mouth at bedtime.    . sertraline (ZOLOFT) 100 MG tablet     . testosterone cypionate (DEPOTESTOTERONE CYPIONATE) 100 MG/ML injection Inject 100 mg into the muscle every 14 (fourteen) days.     . vitamin E (VITAMIN E) 400 UNIT capsule Take 1 capsule (400 Units total) by  mouth 2 (two) times daily. 60 capsule 6   Current Facility-Administered Medications on File Prior to Visit  Medication Dose Route Frequency Provider Last Rate Last Dose  . 0.9 %  sodium chloride infusion  1,000 mL Intravenous Once FTruitt Merle MD        Allergies:  Allergies  Allergen Reactions  . Zocor [Simvastatin] Other (See Comments)    Reaction:  Gave pt a fever    Past Medical History:  Past Medical History:  Diagnosis Date  . Anginal pain ([place]   . Arthritis    mostly hands  . Benign familial hematuria   . BPH (benign prostatic hypertrophy)   . Brain cancer ([place]    melanoma with brain met  . Coronary artery disease   . GERD (gastroesophageal reflux disease)   . High cholesterol   . Hypertension   . Hypothyroidism   . Intracerebral hemorrhage ([place]   . Melanoma ([place]   . Metastatic melanoma to lung ([place]   .  Mood disorder ([place])   . Myocardial infarction ([place])   . Pacemaker    due to syncope, 3rd degree HB (upgrade to [facility]. Jude CRT-P 02/25/13 (Dr. Uvaldo Rising)  . Rectal bleed    due to NSAIDS  . Renal disorder   . Skin cancer    melanoma  . Sleep apnea    uses CPAP  . Stroke [facility])    Past Surgical History:  Past Surgical History:  Procedure Laterality Date  . APPLICATION OF CRANIAL NAVIGATION N/A 10/15/2015   Procedure: APPLICATION OF CRANIAL NAVIGATION;  Surgeon: Erline Levine, MD;  Location: [facility]URO ORS;  Service: Neurosurgery;  Laterality: N/A;  . CARDIAC CATHETERIZATION  2013  . CARDIAC SURGERY     bypass X 2  . COLONOSCOPY WITH PROPOFOL N/A 05/08/2016   Procedure: COLONOSCOPY WITH PROPOFOL;  Surgeon: Jonathon Bellows, MD;  Location: [facility] ENDOSCOPY;  Service: Gastroenterology;  Laterality: N/A;  . CORONARY ARTERY BYPASS GRAFT  2013   LIMA-LAD, SVG-PDA 03/27/11 (Dr. Francee Gentile)  . CRANIOTOMY N/A 10/15/2015   Procedure: LEFT FRONTAL CRANIOTOMY TUMOR EXCISION with Curve;  Surgeon: Erline Levine, MD;  Location: [facility] NEURO ORS;  Service: Neurosurgery;  Laterality: N/A;   CRANIOTOMY TUMOR EXCISION  . EYE SURGERY    . FOOT SURGERY  08/2010  . JOINT REPLACEMENT Left    partial knee  . KNEE ARTHROSCOPY  12/2008  . LYMPH NODE BIOPSY    . PACEMAKER INSERTION    . TONSILLECTOMY     Social History:  Social History   Socioeconomic History  . Marital status: Married    Spouse name: Not on file  . Number of children: 2  . Years of education: Not on file  . Highest education level: Not on file  Social Needs  . Financial resource strain: Not on file  . Food insecurity - worry: Not on file  . Food insecurity - inability: Not on file  . Transportation needs - medical: Not on file  . Transportation needs - non-medical: Not on file  Occupational History  . Occupation: retired Pharmacist, community  Tobacco Use  . Smoking status: Former Smoker    Packs/day: 1.00    Years: 3.00    Pack years: 3.00    Last attempt to quit: 04/10/1957    Years since quitting: 59.9  . Smokeless tobacco: Never Used  Substance and Sexual Activity  . Alcohol use: Yes    Alcohol/week: 24.0 oz    Types: 30 Glasses of wine, 10 Cans of beer per week    Comment: 10 per week, wine per week  . Drug use: No  . Sexual activity: No  Other Topics Concern  . Not on file  Social History Narrative   Wife has dementia   Daughter [name] and son      Has living will   [name] is health care POA   Requests DNR-- done 09/14/15   Not sure about tube feeds   Family History:  Family History  Problem Relation Age of Onset  . Cancer Mother 53       lung   . Hypertension Mother   . Hypertension Father   . Heart attack Father   . Heart disease Father   . Rheum arthritis Sister     Review of Systems: Constitutional: Denies fevers, chills or abnormal weight loss Eyes: Denies blurriness of vision Ears, nose, mouth, throat, and face: Denies mucositis or sore throat Respiratory: Denies cough, dyspnea or wheezes Cardiovascular: Denies palpitation, chest discomfort  or lower extremity  swelling Gastrointestinal:  Denies nausea, constipation, diarrhea GU: Denies dysuria or incontinence Skin: Denies abnormal skin rashes Neurological: Per HPI Musculoskeletal: Denies joint pain, back or neck discomfort. No decrease in ROM Behavioral/Psych: Denies anxiety, disturbance in thought content, and mood instability   Physical Exam: Vitals:   03/12/17 1151  BP: 135/79  Pulse: 96  Resp: 18  Temp: 97.7 F (36.5 C)  SpO2: 96%   KPS: 70. General: Alert, cooperative, pleasant, in no acute distress Head: Craniotomy scar noted, dry and intact. EENT: No conjunctival injection or scleral icterus. Oral mucosa moist Lungs: Resp effort normal Cardiac: Regular rate and rhythm Abdomen: Soft, non-distended abdomen Skin: No rashes cyanosis or petechiae. Extremities: No clubbing or edema  Neurologic Exam: Mental Status: Awake, alert, attentive to examiner. Oriented to self and environment. Language is fluent with intact comprehension.  Age advanced psychomotor slowing.  Limited insight at times.  Cranial Nerves: Visual acuity is grossly normal. Visual fields are full. Extra-ocular movements intact. No ptosis. Face is symmetric, tongue midline. Motor: Tone and bulk are normal. Power is impaired to 4+/5 right leg, flexors>extendors. Reflexes are symmetric, no pathologic reflexes present. Intact finger to nose bilaterally Sensory: Intact to light touch and temperature Gait: Mildly hemiparetic   Labs: I have reviewed the data as listed    Component Value Date/Time   NA 143 02/26/2017 0912   K 4.6 02/26/2017 0912   CL 105 11/04/2016 0723   CO2 22 02/26/2017 0912   GLUCOSE 104 02/26/2017 0912   BUN 29.5 (H) 02/26/2017 0912   CREATININE 1.3 02/26/2017 0912   CALCIUM 8.9 02/26/2017 0912   PROT 6.6 02/26/2017 0912   ALBUMIN 3.4 (L) 02/26/2017 0912   AST 18 02/26/2017 0912   ALT 16 02/26/2017 0912   ALKPHOS 50 02/26/2017 0912   BILITOT 0.45 02/26/2017 0912   GFRNONAA 43 (L)  11/04/2016 0723   GFRAA 50 (L) 11/04/2016 0723   Lab Results  Component Value Date   WBC 4.2 02/26/2017   NEUTROABS 2.7 02/26/2017   HGB 14.3 02/26/2017   HCT 43.1 02/26/2017   MCV 94.3 02/26/2017   PLT 164 02/26/2017     Imaging:  [place] Clinician Interpretation: I have personally reviewed the CNS images as listed.  My interpretation, in the context of the patient's clinical presentation, is stable disease  Ct Head W Wo Contrast  Result Date: 03/08/2017 CLINICAL DATA:  Metastatic melanoma with brain metastasis. Craniotomy and radiation. Cannot have MRI due to pacemaker EXAM: CT HEAD WITHOUT AND WITH CONTRAST TECHNIQUE: Contiguous axial images were obtained from the base of the skull through the vertex without and with intravenous contrast CONTRAST:  100 mL Isovue 370 IV COMPARISON:  CT head 11/27/2016 FINDINGS: Brain: Left frontal craniotomy with encephalomalacia in the left frontal lobe due to tumor removal. Continued improvement in enhancement of the left frontal lobe compared with prior studies. Currently there is minimal if any abnormal enhancement in this area. No mass-effect or edema. Postsurgical hypodensity unchanged with compensatory enlargement of the left frontal horn. Enhancing mass lesion in the right cerebellum measures 6 mm. This is slightly hyperdense prior to contrast. Mild surrounding edema. This has enlarged since the prior study and is compatible with metastatic disease. No other enhancing lesions identified. Negative for hydrocephalus.  Negative for acute infarct. Vascular: Negative for hyperdense vessel. Normal vascular enhancement. Skull: Left parietal skull lesion is stable from prior studies. Left frontal craniotomy unchanged. Sinuses/Orbits: Negative Other: None IMPRESSION: Left frontal craniotomy for  tumor resection. Continued improvement enhancement in the surgical bed compatible with post treatment effect. Enlarging 6 mm enhancing metastatic deposit right cerebellum  with mild surrounding edema. The lesion is hyper dense prior to contrast suggesting mild hemorrhage. Electronically Signed   By: Franchot Gallo M.D.   On: 03/08/2017 11:24      Assessment/Plan 1. Brain metastases ([place])  2. Cognitive Impairment  Mr. Asato is clinically and radiographically stable today.  The cerebellar lesion enhances more vividly then previous studies, and this needs to be followed closely.    He should continue to stay active and rehab aggressively.    At this time we recommend returning to clinic in 3 months with a repeat CT head with and without contrast for evaluation.    The case at that time will be reviewed again in multidisciplinary brain tumor board.   He will continue to follow with Dr. Burr Medico for his systemic therapy, currently Nivolumab q4 weeks.     We appreciate the opportunity to participate in the care of [name].   All questions were answered. The patient knows to call the clinic with any problems, questions or concerns. No barriers to learning were detected.  The total time spent in the encounter was 25 minutes and more than 50% was on counseling and review of test results   Ventura Sellers, MD Medical Director of Neuro-Oncology [facility] [place] 03/12/17 11:45 AM

## 2017-03-12 NOTE — Telephone Encounter (Signed)
Patient declined AVS and calendar of upcoming March appointments.  Patient scheduled per 12/3 los.

## 2017-03-22 NOTE — Progress Notes (Signed)
[place]  Telephone:([phone] Fax:(336) [phone]  Clinic Follow up Note   Patient Care Team: [name], MD as PCP - General (Internal Medicine)   Date of Service:  03/26/2017 2 Chief complaint: Follow-up metastatic melanoma   Oncology History   Metastatic melanoma [facility])   Staging form: Melanoma of the Skin, AJCC 7th Edition     Clinical stage from 09/21/2015: Stage IV [place], [place], [place]) - Signed by [name], MD on 10/09/2015       Malignant melanoma ([place])   09/16/2015 Imaging    PET scan showed a hypermetabolic 8.6PY lingular nodule, and a 62m right upper lobe lung nodule, left subclavicular nodes, and bone metastasis in the proximal right humerus and right femur.       09/21/2015 Initial Diagnosis    Metastatic melanoma ([place]      09/21/2015 Initial Biopsy    Left subclavicular lymph node biopsy showed prostatic of melanoma      09/21/2015 Miscellaneous    BRAF V600K mutation (+)       10/04/2015 Imaging    CT head with without contrast showed a 2.0 x 1.6 x 1.7 cm superficial left frontal hyperdense lesion, with postcontrast enhancement, lying within the previous hemorrhagic area, concerning for solitary melanoma metastasis      10/06/2015 - 09/06/2016 Chemotherapy    Nivolumab '240mg'$  every 2 weeks, held due to his dexamethasone for radionecrosis       10/13/2015 - 10/13/2015 Radiation Therapy    10/13/2015 Preop SRS treatment: 14 Gy  to the left frontal lesion in 1 fraction      10/15/2015 Surgery    left front craniotomy and tumor excision       10/15/2015 Pathology Results    left frontal brain mass excision showed metastatic melanoma, 1.5X1.5X0.6cm      01/13/2016 Imaging    CT head without and with contrast showed ill-defined enhancement and dural thickening of the left frontal resection cavity may represent a post treatment changes or residual neoplasm. A new 6 mm hypodensity in the right cerebellar hemisphere with uncertainty enhancement may  represent a metastasis or small brain parenchymal hemorrhage.       01/27/2016 - 01/27/2016 Radiation Therapy    01/27/16 SRS Treatment:  20 Gy in 1 fraction to a 6 mm right cerebellar brain met       03/29/2016 PET scan    PET 03/29/2016 IMPRESSION: 1. Overall there has been an interval response to therapy. The index lesions sided on previous exam it are less hypermetabolic when compared with the previous exam. 2. There is an intramuscular lesion within the right masseter which has increased FDG uptake compared with previous exam. 3. No new sites of disease identified. 4. Aortic atherosclerosis and infrarenal abdominal aortic aneurysm. Recommend followup by ultrasound in 3 years. This recommendation follows ACR consensus guidelines: White Paper of the ACR Incidental Findings Committee II on Vascular Findings. JJoellyn RuedRadiol 2013; 10:789-794      04/20/2016 Imaging    CT Head w wo contrast 1. Mild patchy enhancement at the left anterior frontal lobe treatment site (series 11, image 39) without mass effect and stable surrounding white matter hypodensity without strong evidence of acute vasogenic edema. Recommend continued CT surveillance. 2. No new metastatic disease or new intracranial abnormality identified. 3. Probable benign 14 mm left parietal bone lucent lesion, unchanged since May 2017.      05/06/2016 - 05/08/2016 Hospital Admission    Pt was admitted for  rectal bleeding for one day. Breathing spontaneously resolved, hemoglobin dropped from 15 to 10.5, did not require blood transfusion. Colonoscopy showed no active bleeding, except diverticulosis and hemorrhoid. He was discharged to home.      05/08/2016 Procedure    Colonoscopy  - Stool in the ascending colon. Fluid aspiration performed. - Diverticulosis in the entire examined colon. - Non-bleeding internal hemorrhoids. - The examination was otherwise normal on direct and retroflexion views.      05/29/2016  Imaging    PET Image Restaging IMPRESSION: 1. Stable exam with no evidence progression. 2. Photopenia in the LEFT frontal lobe at prior surgical site. 3. Stable mildly hypermetabolic RIGHT paratracheal node. This may represent a thyroid nodule. 4. Stable size of minimally metabolic lingular pulmonary nodule. 5. Stable skeletal lesion of the RIGHT iliac crest 6. No cutaneous metabolic lesion.      07/20/2016 Imaging    CT Head W WO Contrast IMPRESSION: Pronounced change in the left frontal region. Much more regional vasogenic edema. Relatively stable appearance of the abnormal enhancement extending from the lateral margin of the frontal horn of left lateral ventricle to the frontal brain surface. New region of abnormal enhancement surrounding the frontal horn of the left lateral ventricle measuring approximately 3.2 cm. The differential diagnosis is recurrent tumor versus radiation necrosis. The patient is reportedly not an MRI candidate. This area is large enough that CT perfusion could possibly make a reasonable differentiation.      09/02/2016 Imaging    CT Head 09/02/16 IMPRESSION: Somewhat mixed pattern of slight posterior progression of pleomorphic enhancement surrounding the LEFT lateral ventricle although no significant mass effect, and reduced vasogenic edema compared with the scan in April.      09/27/2016 - 11/02/2016 Chemotherapy    Start dabrafenib and trametinib 09/27/16, stopped due to severe fatigue        10/05/2016 Imaging    CT Head w & w/o contrast  IMPRESSION: 1. Decreased size of the enhancing area within the left frontal lobe treatment site. Surrounding edema has also decreased. The findings support continued evolution of radiation necrosis. 2. No new enhancing lesions.      10/18/2016 PET scan    IMPRESSION: 1. New focal hypermetabolism in the proximal sigmoid colon with associated focal colonic wall thickening and mild pericolonic fat stranding at  the site of a proximal sigmoid diverticulum, favored to represent mild acute sigmoid diverticulitis. Metastatic disease is considered less likely. Clinical correlation is necessary. Consider appropriate antibiotic therapy, as clinically warranted. Attention to this region recommended on future PET-CT follow-up. 2. Otherwise complete metabolic response with no evidence of hypermetabolic metastatic disease. Lingular pulmonary nodule is slightly decreased in size and demonstrates no significant metabolism. 3. No residual hypermetabolism in the right thyroid lobe nodule, which is stable in size. 4. Aortic Atherosclerosis (ICD10-I70.0). Stable 3.1 cm infrarenal abdominal aortic aneurysm. Aortic aneurysm NOS (ICD10-I71.9). Recommend followup by ultrasound in 3 years. This recommendation follows ACR consensus guidelines: White Paper of the ACR Incidental Findings Committee II on Vascular Findings. Joellyn Rued Radiol 2013; 29:518-841.      11/01/2016 - 11/04/2016 Hospital Admission    Patient admitted to the hospital due to hypercalcemia where he received IV fluids and pamidronate      12/13/2016 PET scan    PET 12/13/16 IMPRESSION: 1. No findings to suggest metastatic disease on today's examination. 2. Previously noted hypermetabolism in the sigmoid colon has resolved, presumably indicative of a focus of diverticulitis on the prior study. Today's study  does demonstrate relatively diffuse hypermetabolism throughout the cecum, ascending colon and proximal transverse colon where there is some mild mural thickening, favored to reflect mild chronic inflammation related to underlying diverticular disease. No definite inflammatory changes on the CT portion of the examination to strongly suggest an active acute diverticulitis at this time. No discrete lesion to suggest neoplasm. 3. Previously noted 8 mm lingular nodule is stable in size and known remains non hypermetabolic. This is nonspecific but  reassuring. Continued attention on future followup studies is recommended. 4. Aortic atherosclerosis, in addition to left main and 3 vessel coronary artery disease. Status post median sternotomy for CABG including LIMA to the LAD. 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 He was found to have left frontal hemorrhage 5.7 cm x 4.5 cm with 6 mm left to right shift.During the workup for his stroke, a left upper lung mass was noticed on the CT scan. I was called to evaluate his lung mass.  Patient has aphasia, not able to communicate much. According to his daughter, Dr. [name], who is a OB GYN at [employer], he probably only smoked for very short period time during the college. He did have 3 skin melanoma, which was removed, last one was 2-3 years ago. No other cancer history.  CURRENT THERAPY:   1.Supportive care and dexamethasone, tapering dose, currently 1 mg every other day for 2 weeks then stopped by 01/29/17 2. Restarted nivolamab on 12/18/2016 and changed to every 4 weeks on 01/30/2017  INTERVAL HISTORY:   Mr. Gehl returns for follow-up. She presents to the clinic today noting his blood pressure is high. His systolic is 706. He took BP medication last night. He takes low dose Coreg given by Dr. Asher Muir. He notes having dizziness when having a shower. He has not had a fall. This dizziness has been going on for 2-3 weeks. He notes having trouble sleeping occasionally. He notes not having the energy to stand without pushing himself off the chair. He still goes to the gym and PT.  He notes being on tylenol once a day now for his arthritis.     REVIEW OF SYSTEMS:   Constitutional: Denies fevers, chills or abnormal weight loss (+) insomnia Eyes: Denies blurriness of vision Ears, nose, mouth, throat, and face: Denies mucositis or sore throat Respiratory: Denies cough, dyspnea or wheezes Cardiovascular: Denies palpitation, chest discomfort or lower extremity swelling Gastrointestinal:  Denies nausea, heartburn, hematochezia  Skin: Negative. Lymphatics: Denies new lymphadenopathy or easy bruising Neurological: (+) dizziness MSK: (+) Arthritis pain  Behavioral/Psych: Mood is stable, no new changes (+) trouble processing short and long term memory All other systems were reviewed with the patient and are negative.  MEDICAL HISTORY:  Past Medical History:  Diagnosis Date  . Anginal pain ([place])   . Arthritis    mostly hands  . Benign familial hematuria   . BPH (benign prostatic hypertrophy)   . Brain cancer ([place])    melanoma with brain met  . Coronary artery disease   . GERD (gastroesophageal reflux disease)   .  High cholesterol   . Hypertension   . Hypothyroidism   . Intracerebral hemorrhage ([place])   . Melanoma ([place])   . Metastatic melanoma to lung ([place])   . Mood disorder ([place])   . Myocardial infarction ([place])   . Pacemaker    due to syncope, 3rd degree HB (upgrade to [facility]. Jude CRT-P 02/25/13 (Dr. Uvaldo Rising)  . Rectal bleed    due to NSAIDS  . Renal disorder   . Skin cancer    melanoma  . Sleep apnea    uses CPAP  . Stroke [facility])     SURGICAL HISTORY: Past Surgical History:  Procedure Laterality Date  . APPLICATION OF CRANIAL NAVIGATION N/A 10/15/2015   Procedure: APPLICATION OF CRANIAL NAVIGATION;  Surgeon: Erline Levine, MD;  Location: [place] NEURO ORS;  Service: Neurosurgery;  Laterality: N/A;  . CARDIAC CATHETERIZATION  2013  . CARDIAC SURGERY     bypass X 2  . COLONOSCOPY WITH PROPOFOL N/A 05/08/2016   Procedure: COLONOSCOPY WITH PROPOFOL;  Surgeon: Jonathon Bellows, MD;  Location: [facility] ENDOSCOPY;  Service: Gastroenterology;  Laterality: N/A;  . CORONARY ARTERY BYPASS GRAFT  2013   LIMA-LAD, SVG-PDA 03/27/11 (Dr. Francee Gentile)  . CRANIOTOMY N/A 10/15/2015   Procedure: LEFT FRONTAL CRANIOTOMY TUMOR EXCISION with Curve;  Surgeon: Erline Levine, MD;  Location: [place] NEURO ORS;  Service: Neurosurgery;  Laterality: N/A;  CRANIOTOMY TUMOR EXCISION  . EYE SURGERY    . FOOT SURGERY  08/2010  . JOINT REPLACEMENT Left    partial knee  . KNEE ARTHROSCOPY  12/2008  . LYMPH NODE BIOPSY    . PACEMAKER INSERTION    . TONSILLECTOMY      I have reviewed the social history and family history with the patient and they are unchanged from previous note.  ALLERGIES:  is allergic to zocor [simvastatin].  MEDICATIONS:  Current Outpatient Medications  Medication Sig Dispense Refill  . aspirin EC 81 MG tablet Take 81 mg by mouth daily.    . carvedilol (COREG) 6.25 MG tablet Take 6.25 mg by mouth daily.     Marland Kitchen levothyroxine (SYNTHROID, LEVOTHROID) 125 MCG tablet Take 125 mcg by mouth daily before breakfast.    . mupirocin ointment (BACTROBAN) 2 % Apply to wound twice a day. 30 g 2  . pentoxifylline (TRENTAL) 400 MG CR tablet Take 1 tablet (400 mg total) by mouth 2 (two) times daily. 60 tablet 6  . rosuvastatin (CRESTOR) 10 MG tablet Take 10 mg by mouth at bedtime.    . sertraline (ZOLOFT) 100 MG tablet     . testosterone cypionate (DEPOTESTOTERONE CYPIONATE) 100 MG/ML injection Inject 100 mg into the muscle every 14 (fourteen) days.     . vitamin E (VITAMIN E) 400 UNIT capsule Take 1 capsule (400 Units total) by mouth 2 (two) times daily. 60 capsule 6   No current facility-administered medications for this visit.    Facility-Administered Medications Ordered in Other Visits  Medication Dose Route Frequency Provider Last Rate Last Dose  . 0.9 %  sodium chloride infusion  1,000 mL Intravenous Once [name], MD        PHYSICAL EXAMINATION:  ECOG PERFORMANCE STATUS: 1 BP (!)  148/101 (BP Location: Right Arm, Patient Position: Sitting)   Pulse 81   Temp 97.8 F (36.6 C) (Oral)   Resp 20   Ht 5' 8.5" (1.74 m)   Wt 208 lb 8 oz (94.6 kg)   SpO2 95%   BMI 31.24 kg/m    GENERAL:alert, no  distress and comfortable SKIN: Significant skin thickening below his right big toe with mild ecchymosis. Other skin color, texture, turgor are normal.  EYES: normal, Conjunctiva are pink and non-injected, sclera clear OROPHARYNX:no exudate, no erythema and lips, buccal mucosa, and tongue normal  NECK: supple, thyroid normal size, non-tender, without nodularity LYMPH:  no palpable lymphadenopathy in the cervical, [place], axillary or inguinal LUNGS: clear to auscultation and percussion with normal breathing effort HEART: regular rate & rhythm and no murmurs and no lower extremity edema ABDOMEN:abdomen soft, non-tender and normal bowel sounds Musculoskeletal:no cyanosis of digits and no clubbing  NEURO: alert & oriented x 3 with fluent speech, he walks independently, gait is normal. RLE strength is 4/5. All other extremities are 5/5.  LABORATORY DATA:  I have reviewed the data as listed CBC Latest Ref Rng & Units 03/26/2017 02/26/2017 01/30/2017  WBC 4.0 - 10.3 10e3/uL 5.0 4.2 5.7  Hemoglobin 13.0 - 17.1 g/dL 15.8 14.3 14.9  Hematocrit 38.4 - 49.9 % 46.9 43.1 42.7  Platelets 140 - 400 10e3/uL 158 164 170     CMP Latest Ref Rng & Units 03/26/2017 02/26/2017 01/30/2017  Glucose 70 - 140 mg/dl 95 104 146(H)  BUN 7.0 - 26.0 mg/dL 19.3 29.5(H) 29.1(H)  Creatinine 0.7 - 1.3 mg/dL 1.2 1.3 1.5(H)  Sodium 136 - 145 mEq/L 140 143 139  Potassium 3.5 - 5.1 mEq/L 4.7 4.6 4.6  Chloride 101 - 111 mmol/L - - -  CO2 22 - 29 mEq/L 21(L) 22 22  Calcium 8.4 - 10.4 mg/dL 8.7 8.9 9.7  Total Protein 6.4 - 8.3 g/dL 6.8 6.6 6.6  Total Bilirubin 0.20 - 1.20 mg/dL 0.58 0.45 0.56  Alkaline Phos 40 - 150 U/L 58 50 47  AST 5 - 34 U/L '22 18 16  '$ ALT 0 - 55 U/L '15 16 13    '$ Results for David [name], David [name]  (MRN [id number]) as of 03/22/2017 18:40  Ref. Range 10/19/2016 09:25 01/15/2017 11:30 01/30/2017 10:05 02/26/2017 09:12  TSH Latest Ref Range: 0.320 - 4.118 m(IU)/L 5.431 (H) <0.080 (L) 0.091 (L) <0.080 (L)  PENDING 03/26/17    PATHOLOGY REPORT: Diagnosis 09/21/2015 Lymph node, needle/core biopsy, hypermetabolic left sub clavicular LN - METASTATIC MALIGNANT MELANOMA. - SEE COMMENT.  Diagnosis 10/15/2015 Brain, for tumor resection, Left frontal - METASTATIC MELANOMA.     PROCEDURES: Colonoscopy 05/08/16 - Stool in the ascending colon. Fluid aspiration performed. - Diverticulosis in the entire examined colon. - Non-bleeding internal hemorrhoids. - The examination was otherwise normal on direct and retroflexion views.  RADIOGRAPHIC STUDIES: I have personally reviewed the radiological images as listed and agreed with the findings in the report. No results found.   CT Head with and without contrast 11/27/2016 IMPRESSION: 1. Continued evolution of post radiation changes in the left frontal lobe with further decrease in the area of contrast enhancement and adjacent edema. 2. No new contrast-enhancing lesions.  PET 10/18/16 IMPRESSION: 1. New focal hypermetabolism in the proximal sigmoid colon with associated focal colonic wall thickening and mild pericolonic fat stranding at the site of a proximal sigmoid diverticulum, favored to represent mild acute sigmoid diverticulitis. Metastatic disease is considered less likely. Clinical correlation is necessary. Consider appropriate antibiotic therapy, as clinically warranted. Attention to this region recommended on future PET-CT follow-up. 2. Otherwise complete metabolic response with no evidence of hypermetabolic metastatic disease. Lingular pulmonary nodule is slightly decreased in size and demonstrates no significant metabolism. 3. No residual hypermetabolism in the right thyroid lobe nodule, which is stable in size.  4. Aortic  Atherosclerosis (ICD10-I70.0). Stable 3.1 cm infrarenal abdominal aortic aneurysm. Aortic aneurysm NOS (ICD10-I71.9). Recommend followup by ultrasound in 3 years. This recommendation follows ACR consensus guidelines: White Paper of the ACR Incidental Findings Committee II on Vascular Findings. J Am Coll Radiol 2013;   CT Head W and WO Contrast 10/05/16 IMPRESSION: 1. Decreased size of the enhancing area within the left frontal lobe treatment site. Surrounding edema has also decreased. The findings support continued evolution of radiation necrosis. 2. No new enhancing lesions.  CT Head 09/02/16 IMPRESSION: Somewhat mixed pattern of slight posterior progression of pleomorphic enhancement surrounding the LEFT lateral ventricle although no significant mass effect, and reduced vasogenic edema compared with the scan in April.  CT Head W WO Contrast 08/13/2016 IMPRESSION: Pronounced change in the left frontal region. Much more regional vasogenic edema. Relatively stable appearance of the abnormal enhancement extending from the lateral margin of the frontal horn of left lateral ventricle to the frontal brain surface. New region of abnormal enhancement surrounding the frontal horn of the left lateral ventricle measuring approximately 3.2 cm. The differential diagnosis is recurrent tumor versus radiation necrosis. The patient is reportedly not an MRI candidate. This area is large enough that CT perfusion could possibly make a reasonable differentiation.  PET Image Restage 05/29/16 IMPRESSION: 1. Stable exam with no evidence progression. 2. Photopenia in the LEFT frontal lobe at prior surgical site. 3. Stable mildly hypermetabolic RIGHT paratracheal node. This may represent a thyroid nodule. 4. Stable size of minimally metabolic lingular pulmonary nodule. 5. Stable skeletal lesion of the RIGHT iliac crest 6. No cutaneous metabolic lesion.   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   1. Metastatic melanoma to lung, node, bone and brain, BRAF V600K mutation (+) -I previously  reviewed his PET/CT scan images with pt and his family members. The scan revealed hypermetabolic left upper lobe lung mass, 2 small right lung nodules, probable bone metastasis, and a subcutaneous left supraclavicular lesion, which is not palpable  -His repeat CT had showed a 2.0 cm left frontal hyperdense lesion with enhancement on contrast, in the area of his recent hemorrhagic stroke, highly suspicious for brain metastasis.  -We previously discussed his left supraclavicular node biopsy results, which showed metastatic melanoma.. -His case was discussed in our sarcoma tumor board. -I previously recommended a first-line treatment with Nivolamab alone, given his recent stroke, low tumor burden. I'll reserve BRAF inhibitor with or without MEK inhibitor as a second line therapy if he progress on immunotherapy. Other options of immunotherapy, especially checkpoint inhibitor with ipilumimab, were also discussed with pt and his daughter -He is on first line therapy with Nivolumab, has been tolerating Nivolumab well, no significant side effects except fatigue. It was held from 09/06/16 to 12/18/2016 due to his chronic steroids use -He has had near complete metabolic response on the recent restaging PET scan in July 2018. -We'll continue Del[place] See monthly for his bone metastasis. -He has recently developed neurological symptoms secondary to his radionecrosis in May 2018, on tapering dose of dexa  -He tried dabrafenib and trametinib when he was off Nivo but tolerated poorly, stopped on 11/01/2016. -PET can 12/13/16 shows no evidence of disease. He has achieved a radiographic complete response.  -He has restarted Nivolamab on 12/18/16 and changed to every 4 weeks on 01/30/17. I plan to continue  Nivolumab for total of 2 years if  he remains to be NED on scan -He finish tapering steroids on 10/32/18, he has tolerated this well.  -I previously encouraged him to be active with walking or the gym since he has completed physical therapy, his daughter has been encouraging to use his pool in his facility.  -I discussed his residual muscle atrophy from previous steroid use can be helped with PT and being active with exercise, which I encourage him to do  -Labs reviewed, Kidney function normalized, TSH normal.Labs adequate to proceed with Nivolumab today and will continue every 4 weeks.  -repeat PET in 3-4 weeks  -f/u in 4 weeks    2. Hemorrhagic stroke and brain metastasis  -I encouraged him to continue physical therapy and occupational therapy. 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 However his clinical neurological function has deteriorated -I have previously discussed with our neuro-oncologist Dr. 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 Mickeal Skinner, his radionecrosis has improved, no other new lesions. He was also seen by Dr. Mickeal Skinner who feels he is recovering well from neurological standpoint -I'll continue tapering his steroids, he is on '1mg'$  daily alternatively dose schedule, we'll change to 1 mg every other day for 2 weeks then stop altogether by 01/29/17 -He will f/u with Dr. Mickeal Skinner, he will order repeated brain MRI/Scan in 03/2017  3. Memory loss and cognitive dysfunction -He has developed a worsening memory loss, mild aphasia,  cognitive dysfunction since May 2018, likely secondary to the radionecrosis of his treated brain metastasis, and previous stroke  -he also has mild right arm weakness lately  -I will refer him to our new oncologist Dr. Mickeal Skinner to discuss treatment options to improve his neurologic function, patient is very frustrated about this, which significantly impacting his quality of life. -continue PT/OT  -CT Head 11/27/2016 showed continued evolution of post radiation changes in the left frontal lobe with further decrease in the area of contrast enhancement and adjacent edema. No new contrast-enhancing lesions.  -The patient previously reviewed these results with neuro-oncologist, Dr. Mickeal Skinner.   4.  Hypercalcemia and AKI  -he was found to have calcium 13.4 and Cr 2.0 on his office visit 11/01/2016 -Possibly related to his underlying malignancy (bone mets) and missing one dose of Xgeva  -He has restarted Xgeva monthly on 11/10/16. Calcium has been normal lately  -kidney function tests have improved as of 02/26/17 -kidney function test normal as of 03/26/17  5. Fatigue, hypothyroidism and hypotestosteronemia -He has baseline hypothyroidism and hypotestosteronemia -His fatigue has much improved since he restarted testosterone injection by his primary care physician -He will follow-up with his primary care physician Dr. Asher Muir for his Synthroid dose adjustment  -His TSH was slightly In November 2017. TSH 9.6 on 06/15/16. -TSH 25.01 on 08/10/16. TSH 5.431 on 10/19/16.  -The patient will continue with light exercise as he is able. -I again encouraged him to be active -I encouraged him to continue outpt physical therapy -TSH <0.08 on 01/15/17, 0.091 on 10/32/18 and <0.08 on 02/26/17 -His TSH is normal today at 0.382 (03/26/18)  6.  Anemia and history of GI bleeding  -his anemia has resolved now -I previously encouraged him to continue oral iron supplement for a total of 3 months  -Anemia has been stable and  resolved lately   7. HTN  -he is on amlodipine and carvedilol, follow-up of his primary care physician -Due to drug interaction with new oral chemo he will need to monitor his BP more often.  -Systolic BP has been high, 148/101 today (03/26/17). He is currently on low dose Coreg. He has been dizzy lately. I will send note to Dr. Asher Muir about medication.  8. Depression  -he is on Zoloft '50mg'$  daily, management by his PCP  -improved overall   9. CKD stage III -His Cr still slightly elevated, improved lately  -I previously encouraged him to drink water adequately -avoid NSAIDs  10. Goal of care discussion  -We previously discussed the incurable nature of his metastatic melanoma, and the overall poor prognosis, especially if he does not have good response to chemotherapy or progress on chemo -The patient understands the goal of care is palliative. -I recommend DNR/DNI, he will think about it   11. CAD -history of 2 double vessel bypass -He has pace maker -per pt (her daughter) request, he has switched his cardiologist to Dr. Haroldine Laws   12. [place] of the right foot  -He was referred to podiatry for further workup and management of this.  -He was seen by Podiatrist and symptoms have improved.   13. Insomnia -I recommend he try OTC melatonin to help him sleep. I reviewed sleep hygiene with him.   Plan -lab reviewed,will proceed with Nivolumab and Xgeva injection today, and continue every 4 weeks  -I spoke with Dr. Asher Muir about his HTN and medication, we will increase his Coreg to 6.25 mg twice daily, I will ask patient's [name] to monitor his blood pressure at home, and to find a new PCP in [place] for him -Lab, F/u and nivolumab and Xgeva in 4 weeks   -PET one week before next visit   All questions were answered. The patient knows to call the clinic with any problems, questions or concerns. No barriers to learning was detected.  I spent 20 minutes counseling the  patient face to face. The total time spent in the appointment was 25 minutes and more than 50% was on counseling and review of test results.  This document serves as a record of services personally performed by [name], MD. It was created on her behalf by Joslyn Devon, a trained medical scribe. The creation of this record is based on the scribe's personal observations and the provider's statements to them.    I have reviewed the above documentation for accuracy and completeness, and I agree with the above.  [name], MD 03/26/17

## 2017-03-26 ENCOUNTER — Other Ambulatory Visit (HOSPITAL_BASED_OUTPATIENT_CLINIC_OR_DEPARTMENT_OTHER): Payer: Medicare Other

## 2017-03-26 ENCOUNTER — Ambulatory Visit (HOSPITAL_BASED_OUTPATIENT_CLINIC_OR_DEPARTMENT_OTHER): Payer: Medicare Other

## 2017-03-26 ENCOUNTER — Encounter: Payer: Self-pay | Admitting: Hematology

## 2017-03-26 ENCOUNTER — Telehealth: Payer: Self-pay | Admitting: Hematology

## 2017-03-26 ENCOUNTER — Ambulatory Visit (HOSPITAL_BASED_OUTPATIENT_CLINIC_OR_DEPARTMENT_OTHER): Payer: Medicare Other | Admitting: Hematology

## 2017-03-26 VITALS — BP 148/101 | HR 81 | Temp 97.8°F | Resp 20 | Ht 68.5 in | Wt 208.5 lb

## 2017-03-26 DIAGNOSIS — C7951 Secondary malignant neoplasm of bone: Secondary | ICD-10-CM | POA: Diagnosis not present

## 2017-03-26 DIAGNOSIS — R5383 Other fatigue: Secondary | ICD-10-CM

## 2017-03-26 DIAGNOSIS — R413 Other amnesia: Secondary | ICD-10-CM

## 2017-03-26 DIAGNOSIS — C439 Malignant melanoma of skin, unspecified: Secondary | ICD-10-CM

## 2017-03-26 DIAGNOSIS — F329 Major depressive disorder, single episode, unspecified: Secondary | ICD-10-CM | POA: Diagnosis not present

## 2017-03-26 DIAGNOSIS — C7800 Secondary malignant neoplasm of unspecified lung: Secondary | ICD-10-CM

## 2017-03-26 DIAGNOSIS — I2510 Atherosclerotic heart disease of native coronary artery without angina pectoris: Secondary | ICD-10-CM | POA: Diagnosis not present

## 2017-03-26 DIAGNOSIS — E039 Hypothyroidism, unspecified: Secondary | ICD-10-CM

## 2017-03-26 DIAGNOSIS — C7931 Secondary malignant neoplasm of brain: Secondary | ICD-10-CM | POA: Diagnosis not present

## 2017-03-26 DIAGNOSIS — I619 Nontraumatic intracerebral hemorrhage, unspecified: Secondary | ICD-10-CM

## 2017-03-26 DIAGNOSIS — C799 Secondary malignant neoplasm of unspecified site: Secondary | ICD-10-CM

## 2017-03-26 DIAGNOSIS — G4700 Insomnia, unspecified: Secondary | ICD-10-CM | POA: Diagnosis not present

## 2017-03-26 DIAGNOSIS — L84 Corns and callosities: Secondary | ICD-10-CM

## 2017-03-26 DIAGNOSIS — N179 Acute kidney failure, unspecified: Secondary | ICD-10-CM | POA: Diagnosis not present

## 2017-03-26 DIAGNOSIS — C770 Secondary and unspecified malignant neoplasm of lymph nodes of head, face and neck: Secondary | ICD-10-CM | POA: Diagnosis not present

## 2017-03-26 DIAGNOSIS — E291 Testicular hypofunction: Secondary | ICD-10-CM | POA: Diagnosis not present

## 2017-03-26 DIAGNOSIS — Z8673 Personal history of transient ischemic attack (TIA), and cerebral infarction without residual deficits: Secondary | ICD-10-CM | POA: Diagnosis not present

## 2017-03-26 DIAGNOSIS — I10 Essential (primary) hypertension: Secondary | ICD-10-CM

## 2017-03-26 DIAGNOSIS — R4701 Aphasia: Secondary | ICD-10-CM

## 2017-03-26 DIAGNOSIS — Z5112 Encounter for antineoplastic immunotherapy: Secondary | ICD-10-CM | POA: Diagnosis present

## 2017-03-26 DIAGNOSIS — N183 Chronic kidney disease, stage 3 unspecified: Secondary | ICD-10-CM

## 2017-03-26 DIAGNOSIS — Z79899 Other long term (current) drug therapy: Secondary | ICD-10-CM | POA: Diagnosis not present

## 2017-03-26 DIAGNOSIS — R4189 Other symptoms and signs involving cognitive functions and awareness: Secondary | ICD-10-CM

## 2017-03-26 LAB — COMPREHENSIVE METABOLIC PANEL
ALBUMIN: 3.7 g/dL (ref 3.5–5.0)
ALK PHOS: 58 U/L (ref 40–150)
ALT: 15 U/L (ref 0–55)
AST: 22 U/L (ref 5–34)
Anion Gap: 8 mEq/L (ref 3–11)
BILIRUBIN TOTAL: 0.58 mg/dL (ref 0.20–1.20)
BUN: 19.3 mg/dL (ref 7.0–26.0)
CO2: 21 mEq/L — ABNORMAL LOW (ref 22–29)
CREATININE: 1.2 mg/dL (ref 0.7–1.3)
Calcium: 8.7 mg/dL (ref 8.4–10.4)
Chloride: 110 mEq/L — ABNORMAL HIGH (ref 98–109)
EGFR: 58 mL/min/{1.73_m2} — AB (ref >60)
GLUCOSE: 95 mg/dL (ref 70–140)
Potassium: 4.7 mEq/L (ref 3.5–5.1)
SODIUM: 140 meq/L (ref 136–145)
TOTAL PROTEIN: 6.8 g/dL (ref 6.4–8.3)

## 2017-03-26 LAB — CBC WITH DIFFERENTIAL/PLATELET
BASO%: 0.5 % (ref 0.0–2.0)
Basophils Absolute: 0 10*3/uL (ref 0.0–0.1)
EOS%: 5.3 % (ref 0.0–7.0)
Eosinophils Absolute: 0.3 10*3/uL (ref 0.0–0.5)
HCT: 46.9 % (ref 38.4–49.9)
HGB: 15.8 g/dL (ref 13.0–17.1)
LYMPH#: 0.6 10*3/uL — AB (ref 0.9–3.3)
LYMPH%: 12.7 % — ABNORMAL LOW (ref 14.0–49.0)
MCH: 31.2 pg (ref 27.2–33.4)
MCHC: 33.6 g/dL (ref 32.0–36.0)
MCV: 92.6 fL (ref 79.3–98.0)
MONO#: 0.6 10*3/uL (ref 0.1–0.9)
MONO%: 12.3 % (ref 0.0–14.0)
NEUT#: 3.4 10*3/uL (ref 1.5–6.5)
NEUT%: 69.2 % (ref 39.0–75.0)
Platelets: 158 10*3/uL (ref 140–400)
RBC: 5.06 10*6/uL (ref 4.20–5.82)
RDW: 14.2 % (ref 11.0–14.6)
WBC: 5 10*3/uL (ref 4.0–10.3)

## 2017-03-26 LAB — TSH: TSH: 0.382 m(IU)/L (ref 0.320–4.118)

## 2017-03-26 MED ORDER — SODIUM CHLORIDE 0.9 % IV SOLN
Freq: Once | INTRAVENOUS | Status: AC
  Administered 2017-03-26: 12:00:00 via INTRAVENOUS

## 2017-03-26 MED ORDER — DENOSUMAB 120 MG/1.7ML ~~LOC~~ SOLN
SUBCUTANEOUS | Status: AC
  Filled 2017-03-26: qty 1.7

## 2017-03-26 MED ORDER — DENOSUMAB 120 MG/1.7ML ~~LOC~~ SOLN
120.0000 mg | Freq: Once | SUBCUTANEOUS | Status: AC
  Administered 2017-03-26: 120 mg via SUBCUTANEOUS

## 2017-03-26 MED ORDER — NIVOLUMAB CHEMO INJECTION 100 MG/10ML
480.0000 mg | Freq: Once | INTRAVENOUS | Status: AC
  Administered 2017-03-26: 480 mg via INTRAVENOUS
  Filled 2017-03-26: qty 48

## 2017-03-26 NOTE — Patient Instructions (Signed)
Nivolumab injection What is this medicine? NIVOLUMAB (nye VOL ue mab) is a monoclonal antibody. It is used to treat melanoma, lung cancer, kidney cancer, head and neck cancer, Hodgkin lymphoma, urothelial cancer, colon cancer, and liver cancer. This medicine may be used for other purposes; ask your health care provider or pharmacist if you have questions. COMMON BRAND NAME(S): Opdivo What should I tell my health care provider before I take this medicine? They need to know if you have any of these conditions: -diabetes -immune system problems -kidney disease -liver disease -lung disease -organ transplant -stomach or intestine problems -thyroid disease -an unusual or allergic reaction to nivolumab, other medicines, foods, dyes, or preservatives -pregnant or trying to get pregnant -breast-feeding How should I use this medicine? This medicine is for infusion into a vein. It is given by a health care professional in a hospital or clinic setting. A special MedGuide will be given to you before each treatment. Be sure to read this information carefully each time. Talk to your pediatrician regarding the use of this medicine in children. While this drug may be prescribed for children as young as 12 years for selected conditions, precautions do apply. Overdosage: If you think you have taken too much of this medicine contact a poison control center or emergency room at once. NOTE: This medicine is only for you. Do not share this medicine with others. What if I miss a dose? It is important not to miss your dose. Call your doctor or health care professional if you are unable to keep an appointment. What may interact with this medicine? Interactions have not been studied. Give your health care provider a list of all the medicines, herbs, non-prescription drugs, or dietary supplements you use. Also tell them if you smoke, drink alcohol, or use illegal drugs. Some items may interact with your  medicine. This list may not describe all possible interactions. Give your health care provider a list of all the medicines, herbs, non-prescription drugs, or dietary supplements you use. Also tell them if you smoke, drink alcohol, or use illegal drugs. Some items may interact with your medicine. What should I watch for while using this medicine? This drug may make you feel generally unwell. Continue your course of treatment even though you feel ill unless your doctor tells you to stop. You may need blood work done while you are taking this medicine. Do not become pregnant while taking this medicine or for 5 months after stopping it. Women should inform their doctor if they wish to become pregnant or think they might be pregnant. There is a potential for serious side effects to an unborn child. Talk to your health care professional or pharmacist for more information. Do not breast-feed an infant while taking this medicine. What side effects may I notice from receiving this medicine? Side effects that you should report to your doctor or health care professional as soon as possible: -allergic reactions like skin rash, itching or hives, swelling of the face, lips, or tongue -black, tarry stools -blood in the urine -bloody or watery diarrhea -changes in vision -change in sex drive -changes in emotions or moods -chest pain -confusion -cough -decreased appetite -diarrhea -facial flushing -feeling faint or lightheaded -fever, chills -hair loss -hallucination, loss of contact with reality -headache -irritable -joint pain -loss of memory -muscle pain -muscle weakness -seizures -shortness of breath -signs and symptoms of high blood sugar such as dizziness; dry mouth; dry skin; fruity breath; nausea; stomach pain; increased hunger or thirst; increased   urination -signs and symptoms of kidney injury like trouble passing urine or change in the amount of urine -signs and symptoms of liver injury  like dark yellow or brown urine; general ill feeling or flu-like symptoms; light-colored stools; loss of appetite; nausea; right upper belly pain; unusually weak or tired; yellowing of the eyes or skin -stiff neck -swelling of the ankles, feet, hands -weight gain Side effects that usually do not require medical attention (report to your doctor or health care professional if they continue or are bothersome): -bone pain -constipation -tiredness -vomiting This list may not describe all possible side effects. Call your doctor for medical advice about side effects. You may report side effects to FDA at 1-800-FDA-1088. Where should I keep my medicine? This drug is given in a hospital or clinic and will not be stored at home. NOTE: This sheet is a summary. It may not cover all possible information. If you have questions about this medicine, talk to your doctor, pharmacist, or health care provider.  2018 Elsevier/Gold Standard (2016-01-03 17:49:34)  

## 2017-03-26 NOTE — Telephone Encounter (Signed)
Scheduled appt per 12/17 los - Gave pt AVS and calender per [facility] radiology to contact patient for PET schedule.

## 2017-04-04 ENCOUNTER — Encounter: Payer: Medicare Other | Admitting: Orthotics

## 2017-04-05 ENCOUNTER — Ambulatory Visit (INDEPENDENT_AMBULATORY_CARE_PROVIDER_SITE_OTHER): Payer: Medicare Other | Admitting: Podiatry

## 2017-04-05 ENCOUNTER — Encounter: Payer: Self-pay | Admitting: Podiatry

## 2017-04-05 DIAGNOSIS — Q828 Other specified congenital malformations of skin: Secondary | ICD-10-CM

## 2017-04-05 DIAGNOSIS — M722 Plantar fascial fibromatosis: Secondary | ICD-10-CM

## 2017-04-05 NOTE — Progress Notes (Signed)
This patient presents the office follow-up for a previously  diagnosed ulcer under the big toe joint, right foot by Dr.  Milinda Pointer.  He was seen by myself on November 26 where debridement of necrotic tissue was performed and padding was added to his orthotic in his shoe.  He was also then told to make an appointment with [facility]  for a new orthoses.  He returns to the office today stating that his foot is still sore but there is no drainage at the site of the previous ulcer.  He presents the office today for continued reevaluation of his feet as well as to pick up his new orthoses   General Appearance  Alert, conversant and in no acute stress.  Vascular  Dorsalis pedis and posterior pulses are palpable  bilaterally.  Capillary return is within normal limits  bilaterally. Temperature is within normal limits  Bilaterally.  Neurologic  [name]-Weinstein monofilament wire test within normal limits  bilaterally. Muscle power within normal limits bilaterally.  Nails Normotropic nails both feet.  Orthopedic  No limitations of motion of motion feet bilaterally.  No crepitus or effusions noted.  DJD 1st MPJ  B/L.  Skin  Porokeratosis sub 1 right and sub 2 left.     No signs of infections or ulcers noted.     Porokeratosis  B/L    Debridement of hyperkeratotic tissue was performed both feet.  Patient was dispensed his new orthoses.  These were placed into his shoes and I remarked that he needs a new pair of shoes.  He is to return to the office in 9 weeks for further evaluation and treatment   Gardiner Barefoot DPM

## 2017-04-05 NOTE — Patient Instructions (Signed)

## 2017-04-16 ENCOUNTER — Ambulatory Visit (INDEPENDENT_AMBULATORY_CARE_PROVIDER_SITE_OTHER): Payer: Medicare Other | Admitting: Family Medicine

## 2017-04-16 ENCOUNTER — Encounter: Payer: Self-pay | Admitting: Family Medicine

## 2017-04-16 VITALS — BP 122/82 | HR 76 | Temp 97.9°F | Ht 68.0 in | Wt 205.2 lb

## 2017-04-16 DIAGNOSIS — R4189 Other symptoms and signs involving cognitive functions and awareness: Secondary | ICD-10-CM | POA: Diagnosis not present

## 2017-04-16 DIAGNOSIS — I611 Nontraumatic intracerebral hemorrhage in hemisphere, cortical: Secondary | ICD-10-CM | POA: Diagnosis not present

## 2017-04-16 DIAGNOSIS — F39 Unspecified mood [affective] disorder: Secondary | ICD-10-CM | POA: Diagnosis not present

## 2017-04-16 DIAGNOSIS — E349 Endocrine disorder, unspecified: Secondary | ICD-10-CM | POA: Diagnosis not present

## 2017-04-16 DIAGNOSIS — C439 Malignant melanoma of skin, unspecified: Secondary | ICD-10-CM

## 2017-04-16 DIAGNOSIS — I255 Ischemic cardiomyopathy: Secondary | ICD-10-CM

## 2017-04-16 DIAGNOSIS — N183 Chronic kidney disease, stage 3 unspecified: Secondary | ICD-10-CM

## 2017-04-16 DIAGNOSIS — E785 Hyperlipidemia, unspecified: Secondary | ICD-10-CM

## 2017-04-16 DIAGNOSIS — N400 Enlarged prostate without lower urinary tract symptoms: Secondary | ICD-10-CM

## 2017-04-16 DIAGNOSIS — E039 Hypothyroidism, unspecified: Secondary | ICD-10-CM

## 2017-04-16 DIAGNOSIS — I2510 Atherosclerotic heart disease of native coronary artery without angina pectoris: Secondary | ICD-10-CM

## 2017-04-16 DIAGNOSIS — I10 Essential (primary) hypertension: Secondary | ICD-10-CM

## 2017-04-16 DIAGNOSIS — C7800 Secondary malignant neoplasm of unspecified lung: Secondary | ICD-10-CM | POA: Diagnosis not present

## 2017-04-16 DIAGNOSIS — C7931 Secondary malignant neoplasm of brain: Secondary | ICD-10-CM

## 2017-04-16 DIAGNOSIS — Z95810 Presence of automatic (implantable) cardiac defibrillator: Secondary | ICD-10-CM

## 2017-04-16 MED ORDER — LEVOTHYROXINE SODIUM 125 MCG PO TABS: 125.0000 ug | ORAL_TABLET | Freq: Every day | ORAL | 6 refills | Status: DC

## 2017-04-16 MED ORDER — METOPROLOL SUCCINATE ER 50 MG PO TB24: 50.0000 mg | ORAL_TABLET | Freq: Every day | ORAL | 6 refills | Status: DC

## 2017-04-16 NOTE — Progress Notes (Signed)
BP 122/82 (BP Location: Left Arm, Patient Position: Sitting, Cuff Size: Normal)   Pulse 76   Temp 97.9 F (36.6 C) (Oral)   Ht 5' 8"  (1.727 m)   Wt 205 lb 4 oz (93.1 kg)   SpO2 93%   BMI 31.21 kg/m    CC: new pt to establish care Subjective:    Patient ID: David Stall., male    DOB: 1940-10-04, 77 y.o.   MRN: [id number]  HPI: David [name]. is a 77 y.o. male presenting on 04/16/2017 for New Patient (Initial Visit)   Father of David [name].  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 All latest notes available in [facility] reviewed. H/o L frontal craniotomy for tumor resection. He also has know 49m metastatic deposit R cerebellum.   [facility] [place]onda [place]  [place]. [facility]  Coreg recently increased to 6.27mbid for better blood pressure control. Actually currently taking toprol XL 505maily. Brings current medications. He also states he's taking levothyroxine 125m71mn place of 175mc58m his med list currently - he's just biting all but the edge of 175mcg33mlets. Takes all his meds in evenings.  Melanoma was diagnosed almost 2 yrs ago. Followed by Dr Feng. [facility] does not drive - for last 4-5 months. Sister drove him here today.  Wife also with dementia.   Long term on testosterone 100mg i68mtion to hips Q2 wks managed by prior PCP.   Reviewing chart, last saw cardiology Dr Cheek [facility]tilano [facility] due to h/o ischemic cardiomyopathy in h/o CAD s/p CABG 2014 with BiVentricular ICD in place with overall stable evaluation - plan at that time was to establish  care closer to home. Will discuss cards referral at f/u visit next month.   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 Interim medical history since our last visit reviewed. Allergies and medications reviewed and updated. Outpatient Medications Prior to Visit  Medication Sig Dispense Refill  . acetaminophen (TYLENOL) 500 MG tablet Take 500 mg by mouth at bedtime.    . aspirMarland Kitchenn EC 81 MG tablet Take 81 mg by mouth daily.    . pentoxifylline (TRENTAL) 400 MG CR tablet Take 1 tablet (400 mg total) by mouth 2 (two) times daily. 60 tablet 6  . rosuvastatin (CRESTOR) 10 MG tablet Take 10 mg by mouth at bedtime.    . sertraline (ZOLOFT) 100 MG tablet     . testosterone cypionate (DEPOTESTOTERONE CYPIONATE) 100 MG/ML injection Inject 100 mg into the muscle every 14 (fourteen) days.     . Turmeric 500 MG CAPS Take 2 capsules by mouth daily.    . vitamin E (VITAMIN E) 400 UNIT capsule Take 1 capsule (400 Units total) by mouth 2 (two) times daily. 60 capsule 6  . carvedilol (COREG) 6.25 MG tablet Take 6.25 mg by mouth daily.     .Marland Kitchen  levothyroxine (SYNTHROID, LEVOTHROID) 125 MCG tablet Take 125 mcg by mouth daily before breakfast.    . mupirocin ointment (BACTROBAN) 2 % Apply to wound twice a day. 30 g 2   Facility-Administered Medications Prior to Visit  Medication Dose Route Frequency Provider Last Rate Last Dose  . 0.9 %  sodium chloride infusion  1,000 mL Intravenous Once [name], MD         Per HPI unless specifically indicated in ROS section below Review of Systems     Objective:    BP 122/82 (BP Location: Left Arm, Patient Position: Sitting, Cuff Size: Normal)   Pulse 76   Temp 97.9 F (36.6 C) (Oral)   Ht 5' 8"  (1.727 m)   Wt 205  lb 4 oz (93.1 kg)   SpO2 93%   BMI 31.21 kg/m   Wt Readings from Last 3 Encounters:  04/16/17 205 lb 4 oz (93.1 kg)  03/26/17 208 lb 8 oz (94.6 kg)  03/12/17 208 lb 12.8 oz (94.7 kg)    Physical Exam  Constitutional: He appears well-developed and well-nourished. No distress.  HENT:  Mouth/Throat: Oropharynx is clear and moist. No oropharyngeal exudate.  Eyes: Conjunctivae are normal. Pupils are equal, round, and reactive to light.  Neck: Normal range of motion. Neck supple.  Cardiovascular: Normal rate, regular rhythm, normal heart sounds and intact distal pulses.  No murmur heard. Pulmonary/Chest: Effort normal and breath sounds normal. No respiratory distress. He has no wheezes. He has no rales.  Coarse at bases  Musculoskeletal: He exhibits no edema.  Skin: Skin is warm and dry. No rash noted.  Psychiatric: He has a normal mood and affect.  Nursing note and vitals reviewed.  Results for orders placed or performed in visit on 03/26/17  TSH  Result Value Ref Range   TSH 0.382 0.320 - 4.118 m(IU)/L  Comprehensive metabolic panel  Result Value Ref Range   Sodium 140 136 - 145 mEq/L   Potassium 4.7 3.5 - 5.1 mEq/L   Chloride 110 (H) 98 - 109 mEq/L   CO2 21 (L) 22 - 29 mEq/L   Glucose 95 70 - 140 mg/dl   BUN 19.3 7.0 - 26.0 mg/dL   Creatinine 1.2 0.7 - 1.3 mg/dL   Total Bilirubin 0.58 0.20 - 1.20 mg/dL   Alkaline Phosphatase 58 40 - 150 U/L   AST 22 5 - 34 U/L   ALT 15 0 - 55 U/L   Total Protein 6.8 6.4 - 8.3 g/dL   Albumin 3.7 3.5 - 5.0 g/dL   Calcium 8.7 8.4 - 10.4 mg/dL   Anion Gap 8 3 - 11 mEq/L   EGFR 58 (L) >60 ml/min/1.73 m2  CBC with Differential  Result Value Ref Range   WBC 5.0 4.0 - 10.3 10e3/uL   NEUT# 3.4 1.5 - 6.5 10e3/uL   HGB 15.8 13.0 - 17.1 g/dL   HCT 46.9 38.4 - 49.9 %   Platelets 158 140 - 400 10e3/uL   MCV 92.6 79.3 - 98.0 fL   MCH 31.2 27.2 - 33.4 pg   MCHC 33.6 32.0 - 36.0 g/dL   RBC 5.06 4.20 - 5.82 10e6/uL   RDW 14.2 11.0 - 14.6 %    lymph# 0.6 (L) 0.9 - 3.3 10e3/uL   MONO# 0.6 0.1 - 0.9 10e3/uL   Eosinophils Absolute 0.3 0.0 - 0.5 10e3/uL   Basophils Absolute 0.0 0.0 - 0.1 10e3/uL   NEUT% 69.2 39.0 - 75.0 %   LYMPH% 12.7 (L) 14.0 -  49.0 %   MONO% 12.3 0.0 - 14.0 %   EOS% 5.3 0.0 - 7.0 %   BASO% 0.5 0.0 - 2.0 %      Assessment & Plan:  Will request records from prior PCP in [place] Problem List Items Addressed This Visit    Acquired hypothyroidism    Will decrease levothyroxine to 165mg daily which pt states is new dose. Will check TFTs in 3 wks.       Relevant Medications   levothyroxine (SYNTHROID, LEVOTHROID) 125 MCG tablet   metoprolol succinate (TOPROL-XL) 50 MG 24 hr tablet   Benign essential HTN    Chronic, seems better controlled on toprol XL 574mdaily. Will continue to monitor. Will aim for tight control in h/o hemorrhagic stroke.       Relevant Medications   metoprolol succinate (TOPROL-XL) 50 MG 24 hr tablet   Benign prostatic hyperplasia    Will need monitoring on testosterone replacement.       Biventricular ICD (implantable cardioverter-defibrillator) in place   Chronic kidney disease, stage III (moderate) ([place]   Will monitor.       Cognitive impairment    Post stroke. Sees neuro-onc.       Coronary artery disease    Will need to establish with local cardiology - will review next visit.       Relevant Medications   metoprolol succinate (TOPROL-XL) 50 MG 24 hr tablet   Hyperlipidemia    Chronic on crestor. Check FLP next fasting labs.  The 10-year ASCVD risk score (GMikey BussingC JrBrooke Bonito et al., 2013) is: 31.6%   Values used to calculate the score:     Age: 89ears     Sex: Male     Is Non-Hispanic African American: No     Diabetic: No     Tobacco smoker: No     Systolic Blood Pressure: 12861mHg     Is BP treated: Yes     HDL Cholesterol: 41 mg/dL     Total Cholesterol: 215 mg/dL       Relevant Medications   metoprolol succinate (TOPROL-XL) 50 MG 24 hr tablet    Hypotestosteronemia    Long term replacement with IM testosterone cypionate injections. Current regimen is 10059m2 wks. Reviewed benefits of testosterone replacement, also reviewed risks of replacement including cardiovascular risk, and reviewed need to monitor other blood work including cholesterol and prostate levels. Pt desires to continue replacement at this time. Will check testosterone levels in 3 wks (mid cycle).       Ischemic cardiomyopathy   Relevant Medications   metoprolol succinate (TOPROL-XL) 50 MG 24 hr tablet   Malignant melanoma ([facility]    Appreciate oncology care ([facility] [place] f/u with them.       [facility] Metastatic melanoma to lung ([facility] Mood disorder ([facility]  Carries this dx but not on antidepressant- will need to further eval.       [facility]  H/o this 06/2016 with residual short term memory loss. Aiming for tight blood pressure control.       Relevant Medications   metoprolol succinate (TOPROL-XL) 50 MG 24 hr tablet       Follow up plan: No Follow-up on file.  JavRia BushD

## 2017-04-16 NOTE — Patient Instructions (Addendum)
Continue current medicines - medicine list updated.  Continue testosterone injections every 2 weeks.  Return Thursday Jan 31st for 8am labs. This should be 1 week after a testsoterone injection.  Return 1 week later (anytime the first week of February) for office visit follow up.  Nice to meet you today! Call us with questions.

## 2017-04-17 ENCOUNTER — Encounter: Payer: Self-pay | Admitting: Family Medicine

## 2017-04-17 NOTE — Assessment & Plan Note (Signed)
Will need to establish with local cardiology - will review next visit.

## 2017-04-17 NOTE — Assessment & Plan Note (Signed)
Post stroke. Sees neuro-onc.

## 2017-04-17 NOTE — Assessment & Plan Note (Signed)
Carries this dx but not on antidepressant- will need to further eval.

## 2017-04-17 NOTE — Assessment & Plan Note (Signed)
Will monitor

## 2017-04-17 NOTE — Assessment & Plan Note (Signed)
Chronic on crestor. Check FLP next fasting labs.  The 10-year ASCVD risk score Mikey Bussing DC [name]., et al., 2013) is: 31.6%   Values used to calculate the score:     Age: 77 years     Sex: Male     Is Non-Hispanic African American: No     Diabetic: No     Tobacco smoker: No     Systolic Blood Pressure: 914 mmHg     Is BP treated: Yes     HDL Cholesterol: 41 mg/dL     Total Cholesterol: 215 mg/dL

## 2017-04-17 NOTE — Assessment & Plan Note (Signed)
Will need monitoring on testosterone replacement.

## 2017-04-17 NOTE — Assessment & Plan Note (Addendum)
H/o this 06/2016 with residual short term memory loss. Aiming for tight blood pressure control.

## 2017-04-17 NOTE — Assessment & Plan Note (Signed)
Appreciate oncology care [facility] and [place]). Continue close f/u with them.

## 2017-04-17 NOTE — Assessment & Plan Note (Signed)
Will decrease levothyroxine to 175mcg daily which pt states is new dose. Will check TFTs in 3 wks.

## 2017-04-17 NOTE — Assessment & Plan Note (Signed)
Chronic, seems better controlled on toprol XL 50mg  daily. Will continue to monitor. Will aim for tight control in h/o hemorrhagic stroke.

## 2017-04-17 NOTE — Assessment & Plan Note (Signed)
Long term replacement with IM testosterone cypionate injections. Current regimen is 100mg  Q2 wks. Reviewed benefits of testosterone replacement, also reviewed risks of replacement including cardiovascular risk, and reviewed need to monitor other blood work including cholesterol and prostate levels. Pt desires to continue replacement at this time. Will check testosterone levels in 3 wks (mid cycle).

## 2017-04-18 ENCOUNTER — Ambulatory Visit
Admission: RE | Admit: 2017-04-18 | Discharge: 2017-04-18 | Disposition: A | Discharge status: Home / Self Care | Payer: Medicare Other | Source: Ambulatory Visit | Attending: Hematology | Admitting: Hematology

## 2017-04-18 DIAGNOSIS — C439 Malignant melanoma of skin, unspecified: Secondary | ICD-10-CM | POA: Diagnosis not present

## 2017-04-18 DIAGNOSIS — I714 Abdominal aortic aneurysm, without rupture: Secondary | ICD-10-CM | POA: Diagnosis not present

## 2017-04-18 DIAGNOSIS — K5730 Diverticulosis of large intestine without perforation or abscess without bleeding: Secondary | ICD-10-CM | POA: Diagnosis not present

## 2017-04-18 DIAGNOSIS — C792 Secondary malignant neoplasm of skin: Secondary | ICD-10-CM | POA: Diagnosis not present

## 2017-04-18 DIAGNOSIS — M4186 Other forms of scoliosis, lumbar region: Secondary | ICD-10-CM | POA: Diagnosis not present

## 2017-04-18 DIAGNOSIS — M25462 Effusion, left knee: Secondary | ICD-10-CM | POA: Diagnosis not present

## 2017-04-18 DIAGNOSIS — I700 Atherosclerosis of aorta: Secondary | ICD-10-CM | POA: Insufficient documentation

## 2017-04-18 DIAGNOSIS — N281 Cyst of kidney, acquired: Secondary | ICD-10-CM | POA: Insufficient documentation

## 2017-04-18 DIAGNOSIS — M1990 Unspecified osteoarthritis, unspecified site: Secondary | ICD-10-CM | POA: Diagnosis not present

## 2017-04-18 LAB — GLUCOSE, CAPILLARY: Glucose-Capillary: 102 mg/dL — ABNORMAL HIGH (ref 65–99)

## 2017-04-18 MED ORDER — FLUDEOXYGLUCOSE F - 18 (FDG) INJECTION
12.2500 | Freq: Once | INTRAVENOUS | Status: AC | PRN
  Administered 2017-04-18: 12.25 via INTRAVENOUS

## 2017-04-19 ENCOUNTER — Encounter: Payer: Self-pay | Admitting: Family Medicine

## 2017-04-19 DIAGNOSIS — Z789 Other specified health status: Secondary | ICD-10-CM | POA: Insufficient documentation

## 2017-04-19 DIAGNOSIS — Z7289 Other problems related to lifestyle: Secondary | ICD-10-CM | POA: Insufficient documentation

## 2017-04-19 DIAGNOSIS — F1090 Alcohol use, unspecified, uncomplicated: Secondary | ICD-10-CM | POA: Insufficient documentation

## 2017-04-20 NOTE — Progress Notes (Signed)
[place]  Telephone:(336) [phone] Fax:(336) [phone]  Clinic Follow up Note   Patient Care Team: [name], MD as PCP - General (Family Medicine)   Date of Service:  04/23/2017  Chief complaint: Follow-up metastatic melanoma   Oncology History   Metastatic melanoma [facility])   Staging form: Melanoma of the Skin, AJCC 7th Edition     Clinical stage from 09/21/2015: Stage IV [place], [place], [place]) - Signed by [name], MD on 10/09/2015       Malignant melanoma ([place])   09/16/2015 Imaging    PET scan showed a hypermetabolic 6.9GE lingular nodule, and a 50m right upper lobe lung nodule, left subclavicular nodes, and bone metastasis in the proximal right humerus and right femur.       09/21/2015 Initial Diagnosis    [place]      09/21/2015 Initial Biopsy    Left subclavicular lymph node biopsy showed prostatic of melanoma      09/21/2015 Miscellaneous    BRAF V600K mutation (+)       10/04/2015 Imaging    CT head with without contrast showed a 2.0 x 1.6 x 1.7 cm superficial left frontal hyperdense lesion, with postcontrast enhancement, lying within the previous hemorrhagic area, concerning for solitary melanoma metastasis      10/06/2015 - 09/06/2016 Chemotherapy    Nivolumab [id number] 2 weeks, held due to his dexamethasone for radionecrosis       10/13/2015 - 10/13/2015 Radiation Therapy    10/13/2015 Preop SRS treatment: 14 Gy  to the left frontal lesion in 1 fraction      10/15/2015 Surgery    left front craniotomy and tumor excision       10/15/2015 Pathology Results    left frontal brain mass excision showed metastatic melanoma, 1.5X1.5X0.6cm      01/13/2016 Imaging    CT head without and with contrast showed ill-defined enhancement and dural thickening of the left frontal resection cavity may represent a post treatment changes or residual neoplasm. A new 6 mm hypodensity in the right cerebellar hemisphere with uncertainty enhancement may  represent a metastasis or small brain parenchymal hemorrhage.       01/27/2016 - 01/27/2016 Radiation Therapy    01/27/16 SRS Treatment:  20 Gy in 1 fraction to a 6 mm right cerebellar brain met       03/29/2016 PET scan    PET 03/29/2016 IMPRESSION: 1. Overall there has been an interval response to therapy. The index lesions sided on previous exam it are less hypermetabolic when compared with the previous exam. 2. There is an intramuscular lesion within the right masseter which has increased FDG uptake compared with previous exam. 3. No new sites of disease identified. 4. Aortic atherosclerosis and infrarenal abdominal aortic aneurysm. Recommend followup by ultrasound in 3 years. This recommendation follows ACR consensus guidelines: White Paper of the ACR Incidental Findings Committee II on Vascular Findings. J Joellyn Ruedadiol 2013; 10:789-794      04/20/2016 Imaging    CT Head w wo contrast 1. Mild patchy enhancement at the left anterior frontal lobe treatment site (series 11, image 39) without mass effect and stable surrounding white matter hypodensity without strong evidence of acute vasogenic edema. Recommend continued CT surveillance. 2. No new metastatic disease or new intracranial abnormality identified. 3. Probable benign 14 mm left parietal bone lucent lesion, unchanged since May 2017.      05/06/2016 - 05/08/2016 Hospital Admission    Pt was admitted for  rectal bleeding for one day. Breathing spontaneously resolved, hemoglobin dropped from 15 to 10.5, did not require blood transfusion. Colonoscopy showed no active bleeding, except diverticulosis and hemorrhoid. He was discharged to home.      05/08/2016 Procedure    Colonoscopy  - Stool in the ascending colon. Fluid aspiration performed. - Diverticulosis in the entire examined colon. - Non-bleeding internal hemorrhoids. - The examination was otherwise normal on direct and retroflexion views.      05/29/2016  Imaging    PET Image Restaging IMPRESSION: 1. Stable exam with no evidence progression. 2. Photopenia in the LEFT frontal lobe at prior surgical site. 3. Stable mildly hypermetabolic RIGHT paratracheal node. This may represent a thyroid nodule. 4. Stable size of minimally metabolic lingular pulmonary nodule. 5. Stable skeletal lesion of the RIGHT iliac crest 6. No cutaneous metabolic lesion.      07/20/2016 Imaging    CT Head W WO Contrast IMPRESSION: Pronounced change in the left frontal region. Much more regional vasogenic edema. Relatively stable appearance of the abnormal enhancement extending from the lateral margin of the frontal horn of left lateral ventricle to the frontal brain surface. New region of abnormal enhancement surrounding the frontal horn of the left lateral ventricle measuring approximately 3.2 cm. The differential diagnosis is recurrent tumor versus radiation necrosis. The patient is reportedly not an MRI candidate. This area is large enough that CT perfusion could possibly make a reasonable differentiation.      09/02/2016 Imaging    CT Head 09/02/16 IMPRESSION: Somewhat mixed pattern of slight posterior progression of pleomorphic enhancement surrounding the LEFT lateral ventricle although no significant mass effect, and reduced vasogenic edema compared with the scan in April.      09/27/2016 - 11/02/2016 Chemotherapy    Start dabrafenib and trametinib 09/27/16, stopped due to severe fatigue        10/05/2016 Imaging    CT Head w & w/o contrast  IMPRESSION: 1. Decreased size of the enhancing area within the left frontal lobe treatment site. Surrounding edema has also decreased. The findings support continued evolution of radiation necrosis. 2. No new enhancing lesions.      10/18/2016 PET scan    IMPRESSION: 1. New focal hypermetabolism in the proximal sigmoid colon with associated focal colonic wall thickening and mild pericolonic fat stranding at  the site of a proximal sigmoid diverticulum, favored to represent mild acute sigmoid diverticulitis. Metastatic disease is considered less likely. Clinical correlation is necessary. Consider appropriate antibiotic therapy, as clinically warranted. Attention to this region recommended on future PET-CT follow-up. 2. Otherwise complete metabolic response with no evidence of hypermetabolic metastatic disease. Lingular pulmonary nodule is slightly decreased in size and demonstrates no significant metabolism. 3. No residual hypermetabolism in the right thyroid lobe nodule, which is stable in size. 4. Aortic Atherosclerosis (ICD10-I70.0). Stable 3.1 cm infrarenal abdominal aortic aneurysm. Aortic aneurysm NOS (ICD10-I71.9). Recommend followup by ultrasound in 3 years. This recommendation follows ACR consensus guidelines: White Paper of the ACR Incidental Findings Committee II on Vascular Findings. Joellyn Rued Radiol 2013; 29:518-841.      11/01/2016 - 11/04/2016 Hospital Admission    Patient admitted to the hospital due to hypercalcemia where he received IV fluids and pamidronate      12/13/2016 PET scan    PET 12/13/16 IMPRESSION: 1. No findings to suggest metastatic disease on today's examination. 2. Previously noted hypermetabolism in the sigmoid colon has resolved, presumably indicative of a focus of diverticulitis on the prior study. Today's study  does demonstrate relatively diffuse hypermetabolism throughout the cecum, ascending colon and proximal transverse colon where there is some mild mural thickening, favored to reflect mild chronic inflammation related to underlying diverticular disease. No definite inflammatory changes on the CT portion of the examination to strongly suggest an active acute diverticulitis at this time. No discrete lesion to suggest neoplasm. 3. Previously noted 8 mm lingular nodule is stable in size and known remains non hypermetabolic. This is nonspecific but  reassuring. Continued attention on future followup studies is recommended. 4. Aortic atherosclerosis, in addition to left main and 3 vessel coronary artery disease. Status post median sternotomy for CABG including LIMA to the LAD. In addition, there is mild fusiform aneurysmal dilatation of the infrarenal abdominal aorta (3.1 cm in diameter) as well as aneurysmal dilatation of the left common iliac      12/18/2016 -  Chemotherapy    Restarted nivolamab 284m every 2 weeks, with tapering dose dexamethasone        04/18/2017 Imaging    PET Scan IMPRESSION: 1. No hypermetabolic lesion to suggest active malignancy. 2. Stable 8 mm lingular nodule is not currently hypermetabolic but merit surveillance. Infrarenal abdominal aortic aneurysm 3.1 cm in diameter. Recommend followup by [facility] 3 years. 3. Other imaging findings of potential clinical significance: Postoperative findings in the left frontal lobe. Right renal cyst. Aortic Atherosclerosis (ICD10-I70.0). Osteoarthritis. Lumbar scoliosis. Left knee effusion. Colonic diverticulosis.        History of present illness (09/02/2015): Pt is a 77y.o. Retired dPharmacist, communitywith history of HTN, OSA, CKD, PPM who was admitted on 08/22/15 with expressive difficulty. He was found to have left frontal hemorrhage 5.7 cm x 4.5 cm with 6 mm left to right shift.During the workup for his stroke, a left upper lung mass was noticed on the CT scan. I was called to evaluate his lung mass.  Patient has aphasia, not able to communicate much. According to his daughter, Dr. LGala Romney who is a OB GYN at [facility] he probably only smoked for very short period time during the college. He did have 3 skin melanoma, which was removed, last one was 2-3 years ago. No other cancer history.  CURRENT THERAPY:  Restarted nivolamab on 12/18/2016 and changed to every 4 weeks on 01/30/2017, changed back to every 2 weeks due to fatigue on 04/23/17  INTERVAL HISTORY:   Mr. HCogburn returns for follow-up. Hereports that he is doing well overall. Pt notes that his blood pressure has been wavering between high readings and low. He checks it regularly at home. His BP is 163/116 today. He states he takes his Metoprolol at night regularly. He recently switched PCP and has a follow up appointment on 05/11/17. Pt denies chest pain, headache or any other associated symptoms. Pt's daughter has concerns that he has increased fatigue after chemotherapy and has inquired about him receiving treatment every other week.   Of note since the patient last visit, he has had a PET scan completed on 04/18/2017 with results revealing: 1. No hypermetabolic lesion to suggest active malignancy. 2. Stable 8 mm lingular nodule is not currently hypermetabolic but merit surveillance. Infrarenal abdominal aortic aneurysm 3.1 cm in diameter. Recommend followup by [facility] 3 years. 3. Other imaging findings of potential clinical significance: Postoperative findings in the left frontal lobe. Right renal cyst. Aortic Atherosclerosis (ICD10-I70.0). Osteoarthritis. Lumbar scoliosis. Left knee effusion. Colonic diverticulosis.  On review of systems, pt reports no other complaints at this time.    REVIEW  OF SYSTEMS:   Constitutional: Denies fevers, chills or abnormal weight loss (+) insomnia Eyes: Denies blurriness of vision Ears, nose, mouth, throat, and face: Denies mucositis or sore throat Respiratory: Denies cough, dyspnea or wheezes Cardiovascular: Denies palpitation, chest discomfort or lower extremity swelling Gastrointestinal:  Denies nausea, heartburn, hematochezia  Skin: Negative. Lymphatics: Denies new lymphadenopathy or easy bruising Neurological: (+) dizziness MSK: (+) Arthritis pain  Behavioral/Psych: Mood is stable, no new changes (+) trouble processing short and long term memory All other systems were reviewed with the patient and are negative.  MEDICAL HISTORY:  Past Medical History:  Diagnosis Date   . Anginal pain ([place])   . Arthritis    mostly hands  . Benign familial hematuria   . BPH (benign prostatic hypertrophy)   . Brain cancer ([place])    melanoma with brain met  . Coronary artery disease   . GERD (gastroesophageal reflux disease)   . High cholesterol   . Hypertension   . Hypothyroidism   . Intracerebral hemorrhage ([place])   . Melanoma ([place])   . Metastatic melanoma to lung ([place])   . Mood disorder ([place])   . Myocardial infarction ([place])   . Pacemaker    due to syncope, 3rd degree HB (upgrade to [facility]. Jude CRT-P 02/25/13 (Dr. Uvaldo Rising)  . Rectal bleed    due to NSAIDS  . Renal disorder   . Skin cancer    melanoma  . Sleep apnea    uses CPAP  . Stroke [facility])     SURGICAL HISTORY: Past Surgical History:  Procedure Laterality Date  . APPLICATION OF CRANIAL NAVIGATION N/A 10/15/2015   Procedure: APPLICATION OF CRANIAL NAVIGATION;  Surgeon: Erline Levine, MD;  Location: [facility] NEURO ORS;  Service: Neurosurgery;  Laterality: N/A;  . CARDIAC CATHETERIZATION  2013  . CARDIAC SURGERY     bypass X 2  . COLONOSCOPY WITH PROPOFOL N/A 05/08/2016   Procedure: COLONOSCOPY WITH PROPOFOL;  Surgeon: Jonathon Bellows, MD;  Location: [facility] ENDOSCOPY;  Service: Gastroenterology;  Laterality: N/A;  . CORONARY ARTERY BYPASS GRAFT  2013   LIMA-LAD, SVG-PDA 03/27/11 (Dr. Francee Gentile)  . CRANIOTOMY N/A 10/15/2015   Procedure: LEFT FRONTAL CRANIOTOMY TUMOR EXCISION with Curve;  Surgeon: Erline Levine, MD;  Location: [place] NEURO ORS;  Service: Neurosurgery;  Laterality: N/A;  CRANIOTOMY TUMOR EXCISION  . EYE SURGERY    . FOOT SURGERY  08/2010  . JOINT REPLACEMENT Left    partial knee  . KNEE ARTHROSCOPY  12/2008  . LYMPH NODE BIOPSY    . PACEMAKER INSERTION    . TONSILLECTOMY      I have reviewed the social history and family history with the patient and they are unchanged from previous note.  ALLERGIES:  is allergic to zocor [simvastatin].  MEDICATIONS:  Current Outpatient Medications  Medication Sig Dispense  Refill  . acetaminophen (TYLENOL) 500 MG tablet Take 500 mg by mouth at bedtime.    Marland Kitchen aspirin EC 81 MG tablet Take 81 mg by mouth daily.    Marland Kitchen levothyroxine (SYNTHROID, LEVOTHROID) 125 MCG tablet Take 1 tablet (125 mcg total) by mouth daily before breakfast. 30 tablet 6  . metoprolol succinate (TOPROL-XL) 50 MG 24 hr tablet Take 1 tablet (50 mg total) by mouth daily. Take with or immediately following a meal. 30 tablet 6  . pentoxifylline (TRENTAL) 400 MG CR tablet Take 1 tablet (400 mg total) by mouth 2 (two) times daily. 60 tablet 6  . rosuvastatin (CRESTOR) 10 MG tablet Take 10 mg  by mouth at bedtime.    . sertraline (ZOLOFT) 100 MG tablet     . testosterone cypionate (DEPOTESTOTERONE CYPIONATE) 100 MG/ML injection Inject 100 mg into the muscle every 14 (fourteen) days.     . Turmeric 500 MG CAPS Take 2 capsules by mouth daily.    . vitamin E (VITAMIN E) 400 UNIT capsule Take 1 capsule (400 Units total) by mouth 2 (two) times daily. 60 capsule 6   No current facility-administered medications for this visit.    Facility-Administered Medications Ordered in Other Visits  Medication Dose Route Frequency Provider Last Rate Last Dose  . 0.9 %  sodium chloride infusion  1,000 mL Intravenous Once [name], MD        PHYSICAL EXAMINATION:  ECOG PERFORMANCE STATUS: 1 BP (!) 149/106 (BP Location: Left Arm, Patient Position: Sitting) Comment: Notified Nurse Of BP  Pulse 77   Temp 97.7 F (36.5 C) (Oral)   Resp 18   Ht 5' 8" (1.727 m)   Wt 203 lb 1.6 oz (92.1 kg)   SpO2 95%   BMI 30.88 kg/m    GENERAL:alert, no distress and comfortable SKIN: Significant skin thickening below his right big toe with mild ecchymosis. Other skin color, texture, turgor are normal.  EYES: normal, Conjunctiva are pink and non-injected, sclera clear OROPHARYNX:no exudate, no erythema and lips, buccal mucosa, and tongue normal  NECK: supple, thyroid normal size, non-tender, without nodularity LYMPH:  no palpable  lymphadenopathy in the cervical, [place], axillary or inguinal LUNGS: clear to auscultation and percussion with normal breathing effort HEART: regular rate & rhythm and no murmurs and no lower extremity edema ABDOMEN:abdomen soft, non-tender and normal bowel sounds Musculoskeletal:no cyanosis of digits and no clubbing  NEURO: alert & oriented x 3 with fluent speech, he walks independently, gait is normal. RLE strength is 4/5. All other extremities are 5/5.  LABORATORY DATA:  I have reviewed the data as listed CBC Latest Ref Rng & Units 04/23/2017 03/26/2017 02/26/2017  WBC 4.0 - 10.3 K/uL 4.9 5.0 4.2  Hemoglobin 13.0 - 17.1 g/dL 16.2 15.8 14.3  Hematocrit 38.4 - 49.9 % 48.9 46.9 43.1  Platelets 140 - 400 K/uL 176 158 164     CMP Latest Ref Rng & Units 04/23/2017 03/26/2017 02/26/2017  Glucose 70 - 140 mg/dL 96 95 104  BUN 7 - 26 mg/dL 25 19.3 29.5(H)  Creatinine 0.70 - 1.30 mg/dL 1.32(H) 1.2 1.3  Sodium 136 - 145 mmol/L 142 140 143  Potassium 3.5 - 5.1 mmol/L 4.7 4.7 4.6  Chloride 98 - 109 mmol/L 111(H) - -  CO2 22 - 29 mmol/L 23 21(L) 22  Calcium 8.4 - 10.4 mg/dL 9.0 8.7 8.9  Total Protein 6.4 - 8.3 g/dL 6.8 6.8 6.6  Total Bilirubin 0.2 - 1.2 mg/dL 0.6 0.58 0.45  Alkaline Phos 40 - 150 U/L 52 58 50  AST 5 - 34 U/L _0 ALT 0 - 55 U/L _1 Results for TREY, GULBRANSON (MRN [id number]) as of 03/22/2017 18:40  Ref. Range 10/19/2016 09:25 01/15/2017 11:30 01/30/2017 10:05 02/26/2017 09:12 03/26/2017 09:28  TSH Latest Ref Range: 0.320 - 4.118 m(IU)/L 5.431 (H) <0.080 (L) 0.091 (L) <0.080 (L) 0.382     PATHOLOGY REPORT: Diagnosis 09/21/2015 Lymph node, needle/core biopsy, hypermetabolic left sub clavicular LN - METASTATIC MALIGNANT MELANOMA. - SEE COMMENT.  Diagnosis 10/15/2015 Brain, for tumor resection, Left frontal - METASTATIC MELANOMA.     PROCEDURES: Colonoscopy 05/08/16 - Stool in  the ascending colon. Fluid aspiration performed. - Diverticulosis in the entire  examined colon. - Non-bleeding internal hemorrhoids. - The examination was otherwise normal on direct and retroflexion views.  RADIOGRAPHIC STUDIES: I have personally reviewed the radiological images as listed and agreed with the findings in the report. No results found.   CT Head with and without contrast 11/27/2016 IMPRESSION: 1. Continued evolution of post radiation changes in the left frontal lobe with further decrease in the area of contrast enhancement and adjacent edema. 2. No new contrast-enhancing lesions.  PET 10/18/16 IMPRESSION: 1. New focal hypermetabolism in the proximal sigmoid colon with associated focal colonic wall thickening and mild pericolonic fat stranding at the site of a proximal sigmoid diverticulum, favored to represent mild acute sigmoid diverticulitis. Metastatic disease is considered less likely. Clinical correlation is necessary. Consider appropriate antibiotic therapy, as clinically warranted. Attention to this region recommended on future PET-CT follow-up. 2. Otherwise complete metabolic response with no evidence of hypermetabolic metastatic disease. Lingular pulmonary nodule is slightly decreased in size and demonstrates no significant metabolism. 3. No residual hypermetabolism in the right thyroid lobe nodule, which is stable in size. 4. Aortic Atherosclerosis (ICD10-I70.0). Stable 3.1 cm infrarenal abdominal aortic aneurysm. Aortic aneurysm NOS (ICD10-I71.9). Recommend followup by ultrasound in 3 years. This recommendation follows ACR consensus guidelines: White Paper of the ACR Incidental Findings Committee II on Vascular Findings. J Am Coll Radiol 2013;   CT Head W and WO Contrast 10/05/16 IMPRESSION: 1. Decreased size of the enhancing area within the left frontal lobe treatment site. Surrounding edema has also decreased. The findings support continued evolution of radiation necrosis. 2. No new enhancing lesions.  CT Head  09/02/16 IMPRESSION: Somewhat mixed pattern of slight posterior progression of pleomorphic enhancement surrounding the LEFT lateral ventricle although no significant mass effect, and reduced vasogenic edema compared with the scan in April.  CT Head W WO Contrast August 04, 2016 IMPRESSION: Pronounced change in the left frontal region. Much more regional vasogenic edema. Relatively stable appearance of the abnormal enhancement extending from the lateral margin of the frontal horn of left lateral ventricle to the frontal brain surface. New region of abnormal enhancement surrounding the frontal horn of the left lateral ventricle measuring approximately 3.2 cm. The differential diagnosis is recurrent tumor versus radiation necrosis. The patient is reportedly not an MRI candidate. This area is large enough that CT perfusion could possibly make a reasonable differentiation.  PET Image Restage 05/29/16 IMPRESSION: 1. Stable exam with no evidence progression. 2. Photopenia in the LEFT frontal lobe at prior surgical site. 3. Stable mildly hypermetabolic RIGHT paratracheal node. This may represent a thyroid nodule. 4. Stable size of minimally metabolic lingular pulmonary nodule. 5. Stable skeletal lesion of the RIGHT iliac crest 6. No cutaneous metabolic lesion.   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   1. Metastatic melanoma to lung, node, bone and brain, BRAF V600K mutation (+) -I previously  reviewed his PET/CT scan images with pt and his family members. The scan revealed hypermetabolic left upper lobe lung mass, 2 small right lung nodules, probable bone metastasis, and a subcutaneous left supraclavicular lesion, which is not palpable  -His repeat CT had showed a 2.0 cm left frontal hyperdense lesion with enhancement on contrast, in the area of his recent  hemorrhagic stroke, highly suspicious for brain metastasis.  -We previously discussed his left supraclavicular node biopsy results, which showed  metastatic melanoma.. -His case was discussed in our sarcoma tumor board. -I previously recommended a first-line treatment with Nivolamab alone, given his recent stroke, low tumor burden. I'll reserve BRAF inhibitor with or without MEK inhibitor as a second line therapy if he progress on immunotherapy. Other options of immunotherapy, especially checkpoint inhibitor with ipilumimab, were also discussed with pt and his daughter -He is on first line therapy with Nivolumab, has been tolerating Nivolumab well, no significant side effects except fatigue. It was held from 09/06/16 to 12/18/2016 due to his chronic steroids use -He has had near complete metabolic response on the recent restaging PET scan in July 2018. -We'll continue Delton See monthly for his bone metastasis. -He has recently developed neurological symptoms secondary to his radionecrosis in May 2018, on tapering dose of dexa  -He tried dabrafenib and trametinib when he was off Nivo but tolerated poorly, stopped on 11/01/2016. -PET can 12/13/16 shows no evidence of disease. He has achieved a radiographic complete response.  -He has restarted Nivolamab on 12/18/16 and changed to every 4 weeks on 01/30/17. I plan to continue Nivolumab for total of 2 years if he remains to be NED on scan -He finish tapering steroids on 10/32/18, he has tolerated this well.  -I previously encouraged him to be active with walking or the gym since he has completed physical therapy, his daughter has been encouraging to use his pool in his facility.  -I discussed his residual muscle atrophy from previous steroid use can be helped with PT and being active with exercise, which I encourage him to do  -PET scan on 04/18/2017 again showed complete radiographic metabolic response with only a residual 5m left lung nodule without any FDG uptake.  Discussed results with pt and he is pleased  -He is clinically doing well, no symptoms, except memory loss and cognitive dysfunction, able to do self-care at home. -Labs reviewed today (04/23/17) and his CBC is WNL to continue with Nivolumab today.  Due to his fatigue after treatment, will change his Nivolumab back to every 2 weeks  -If he remains to be in complete response by September 2019, I will stop his nivolumab after a total of 2 years treatment. -f/u in 4 weeks  2. History of hemorrhagic stroke and brain metastasis  -I encouraged him to continue physical therapy and occupational therapy. 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 However his clinical neurological function has deteriorated -I have previously discussed with our neuro-oncologist Dr. 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 VMickeal Skinner his radionecrosis has improved, no other new lesions. He was also seen by Dr. VMickeal Skinnerwho feels he is recovering well from neurological standpoint -I'll continue tapering his steroids, he is on 128mdaily alternatively dose schedule, we'll change to 1 mg every other day for 2 weeks then stop altogether by 01/29/17 -He will f/u with Dr. VaMickeal Skinner 3. 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 Mickeal Skinner to discuss treatment options to improve his neurologic function, patient is very frustrated about this, which significantly impacting his quality of life. -continue PT/OT  -CT Head 11/27/2016 showed continued evolution of post radiation changes in the left frontal lobe with further decrease in the area of contrast enhancement and adjacent edema. No new contrast-enhancing lesions.  -The patient previously reviewed these results with neuro-oncologist, Dr. Mickeal Skinner.   4.  Hypercalcemia and AKI  -he was found to have calcium 13.4 and Cr 2.0 on his office visit 11/01/2016 -Possibly related to his underlying malignancy (bone mets) and missing one dose of Xgeva  -He has restarted Xgeva monthly on 11/10/16. Calcium has been normal lately  -kidney function tests have improved as of 02/26/17 -kidney function test normal as of 03/26/17   5. Fatigue, hypothyroidism and hypotestosteronemia -He has baseline hypothyroidism and hypotestosteronemia -His fatigue has much improved since he restarted testosterone injection by his primary care physician -He will follow-up with his primary care physician for his Synthroid dose adjustment  -His TSH was slightly In November 2017. TSH 9.6 on 06/15/16. -TSH 25.01 on 08/10/16. TSH 5.431 on 10/19/16.  -The patient will continue with light exercise as he is able. -I again encouraged him to be active -I encouraged him to continue outpt physical therapy -TSH <0.08 on 01/15/17, 0.091 on 10/32/18 and <0.08 on 02/26/17 -His TSH is normal today at 0.382 (03/26/18)   6. Anemia and history of GI bleeding  -his anemia has resolved now -I previously encouraged him to continue oral iron supplement for a total of 3 months  -Anemia has been stable and resolved lately   7. HTN  -he is on amlodipine and carvedilol, follow-up of his primary care physician -Due to drug interaction with new oral chemo he will need to monitor his BP more often.  -Systolic BP has been  high, 148/101 today (03/26/17). He is currently on low dose Coreg. He has been dizzy lately. I will send note to Dr. Asher Muir about medication. -163/116 today, pt will follow up with PCP and maybe add another form of BP medication. No chest pain or headaches. Will repeat in infusion room.  -I advised him to continue to keep a BP log at home   8. Depression  -he is on Zoloft 3m daily, management by his PCP  -improved overall   9. CKD stage III -His Cr still slightly elevated, improved lately  -I previously encouraged him to drink water adequately -avoid NSAIDs  10. Goal of care discussion  -We previously discussed the incurable nature of his metastatic melanoma, and the overall poor prognosis, especially if he does not have good response to chemotherapy or progress on chemo -The patient understands the goal of care is palliative. -I previously recommended DNR/DNI, he will think about it   11. CAD -history of 2 double vessel bypass -He has pace maker -per pt (her daughter) request, he has switched his cardiologist to Dr. BHaroldine Laws  12. [place] Callus of the right foot  -He was referred to podiatry for further workup and management of this.  -He was seen by Podiatrist and symptoms have improved.   13. Insomnia -I previously recommended he try OTC melatonin to help him sleep. I reviewed sleep hygiene with him.   Pla PET scan reviewed, and ED  We will change his Nivolumab to every 2 weeks due  to his fatigue, treatment today  lab and chemo nivolumab in 2, 4 and 6 weeks F/u in 4 weeks    All questions were answered. The patient knows to call the clinic with any problems, questions or concerns. No barriers to learning was detected.  I spent 20 minutes counseling the patient face to face. The total time spent in the appointment was 25 minutes and more than 50% was on counseling and review of test results.  This document serves as a record of services personally performed by [name],  MD. It was created on her behalf by Theresia Bough, a trained medical scribe. The creation of this record is based on the scribe's personal observations and the provider's statements to them.   I have reviewed the above documentation for accuracy and completeness, and I agree with the above.   [name], MD 04/23/17

## 2017-04-23 ENCOUNTER — Encounter: Payer: Self-pay | Admitting: Hematology

## 2017-04-23 ENCOUNTER — Telehealth: Payer: Self-pay | Admitting: Hematology

## 2017-04-23 ENCOUNTER — Inpatient Hospital Stay: Payer: Medicare Other

## 2017-04-23 ENCOUNTER — Inpatient Hospital Stay: Payer: Medicare Other | Attending: Hematology

## 2017-04-23 ENCOUNTER — Inpatient Hospital Stay (HOSPITAL_BASED_OUTPATIENT_CLINIC_OR_DEPARTMENT_OTHER): Payer: Medicare Other | Admitting: Hematology

## 2017-04-23 VITALS — BP 149/106 | HR 77 | Temp 97.7°F | Resp 18 | Ht 68.0 in | Wt 203.1 lb

## 2017-04-23 VITALS — BP 147/94 | HR 77

## 2017-04-23 DIAGNOSIS — I619 Nontraumatic intracerebral hemorrhage, unspecified: Secondary | ICD-10-CM

## 2017-04-23 DIAGNOSIS — E039 Hypothyroidism, unspecified: Secondary | ICD-10-CM | POA: Diagnosis not present

## 2017-04-23 DIAGNOSIS — C7951 Secondary malignant neoplasm of bone: Secondary | ICD-10-CM | POA: Diagnosis not present

## 2017-04-23 DIAGNOSIS — C7800 Secondary malignant neoplasm of unspecified lung: Secondary | ICD-10-CM | POA: Insufficient documentation

## 2017-04-23 DIAGNOSIS — N281 Cyst of kidney, acquired: Secondary | ICD-10-CM

## 2017-04-23 DIAGNOSIS — Z950 Presence of cardiac pacemaker: Secondary | ICD-10-CM | POA: Diagnosis not present

## 2017-04-23 DIAGNOSIS — Z5112 Encounter for antineoplastic immunotherapy: Secondary | ICD-10-CM | POA: Insufficient documentation

## 2017-04-23 DIAGNOSIS — Z8673 Personal history of transient ischemic attack (TIA), and cerebral infarction without residual deficits: Secondary | ICD-10-CM | POA: Diagnosis not present

## 2017-04-23 DIAGNOSIS — C7931 Secondary malignant neoplasm of brain: Secondary | ICD-10-CM | POA: Diagnosis not present

## 2017-04-23 DIAGNOSIS — N183 Chronic kidney disease, stage 3 unspecified: Secondary | ICD-10-CM

## 2017-04-23 DIAGNOSIS — I10 Essential (primary) hypertension: Secondary | ICD-10-CM

## 2017-04-23 DIAGNOSIS — I129 Hypertensive chronic kidney disease with stage 1 through stage 4 chronic kidney disease, or unspecified chronic kidney disease: Secondary | ICD-10-CM | POA: Insufficient documentation

## 2017-04-23 DIAGNOSIS — C770 Secondary and unspecified malignant neoplasm of lymph nodes of head, face and neck: Secondary | ICD-10-CM | POA: Diagnosis not present

## 2017-04-23 DIAGNOSIS — Z85820 Personal history of malignant melanoma of skin: Secondary | ICD-10-CM | POA: Insufficient documentation

## 2017-04-23 DIAGNOSIS — Z87891 Personal history of nicotine dependence: Secondary | ICD-10-CM

## 2017-04-23 DIAGNOSIS — G4700 Insomnia, unspecified: Secondary | ICD-10-CM | POA: Insufficient documentation

## 2017-04-23 DIAGNOSIS — C439 Malignant melanoma of skin, unspecified: Secondary | ICD-10-CM

## 2017-04-23 DIAGNOSIS — R4701 Aphasia: Secondary | ICD-10-CM | POA: Insufficient documentation

## 2017-04-23 DIAGNOSIS — I714 Abdominal aortic aneurysm, without rupture: Secondary | ICD-10-CM | POA: Diagnosis not present

## 2017-04-23 DIAGNOSIS — F329 Major depressive disorder, single episode, unspecified: Secondary | ICD-10-CM | POA: Insufficient documentation

## 2017-04-23 DIAGNOSIS — I700 Atherosclerosis of aorta: Secondary | ICD-10-CM

## 2017-04-23 DIAGNOSIS — I25810 Atherosclerosis of coronary artery bypass graft(s) without angina pectoris: Secondary | ICD-10-CM

## 2017-04-23 DIAGNOSIS — R530 Neoplastic (malignant) related fatigue: Secondary | ICD-10-CM | POA: Diagnosis not present

## 2017-04-23 DIAGNOSIS — L84 Corns and callosities: Secondary | ICD-10-CM | POA: Diagnosis not present

## 2017-04-23 DIAGNOSIS — C799 Secondary malignant neoplasm of unspecified site: Secondary | ICD-10-CM

## 2017-04-23 DIAGNOSIS — E291 Testicular hypofunction: Secondary | ICD-10-CM | POA: Insufficient documentation

## 2017-04-23 DIAGNOSIS — R4189 Other symptoms and signs involving cognitive functions and awareness: Secondary | ICD-10-CM

## 2017-04-23 LAB — CBC WITH DIFFERENTIAL/PLATELET
BASOS PCT: 1 %
Basophils Absolute: 0 10*3/uL (ref 0.0–0.1)
EOS ABS: 0.3 10*3/uL (ref 0.0–0.5)
EOS PCT: 6 %
HCT: 48.9 % (ref 38.4–49.9)
HEMOGLOBIN: 16.2 g/dL (ref 13.0–17.1)
LYMPHS ABS: 0.5 10*3/uL — AB (ref 0.9–3.3)
Lymphocytes Relative: 10 %
MCH: 30.4 pg (ref 27.2–33.4)
MCHC: 33.1 g/dL (ref 32.0–36.0)
MCV: 92 fL (ref 79.3–98.0)
MONO ABS: 0.5 10*3/uL (ref 0.1–0.9)
MONOS PCT: 10 %
NEUTROS PCT: 73 %
Neutro Abs: 3.6 10*3/uL (ref 1.5–6.5)
PLATELETS: 176 10*3/uL (ref 140–400)
RBC: 5.32 MIL/uL (ref 4.20–5.82)
RDW: 14.1 % (ref 11.0–15.6)
WBC: 4.9 10*3/uL (ref 4.0–10.3)

## 2017-04-23 LAB — COMPREHENSIVE METABOLIC PANEL
ALT: 14 U/L (ref 0–55)
AST: 19 U/L (ref 5–34)
Albumin: 3.6 g/dL (ref 3.5–5.0)
Alkaline Phosphatase: 52 U/L (ref 40–150)
Anion gap: 8 (ref 3–11)
BUN: 25 mg/dL (ref 7–26)
CALCIUM: 9 mg/dL (ref 8.4–10.4)
CHLORIDE: 111 mmol/L — AB (ref 98–109)
CO2: 23 mmol/L (ref 22–29)
CREATININE: 1.32 mg/dL — AB (ref 0.70–1.30)
GFR calc non Af Amer: 50 mL/min — ABNORMAL LOW (ref >60)
GFR, EST AFRICAN AMERICAN: 58 mL/min — AB (ref >60)
GLUCOSE: 96 mg/dL (ref 70–140)
Potassium: 4.7 mmol/L (ref 3.5–5.1)
SODIUM: 142 mmol/L (ref 136–145)
Total Bilirubin: 0.6 mg/dL (ref 0.2–1.2)
Total Protein: 6.8 g/dL (ref 6.4–8.3)

## 2017-04-23 LAB — TSH: TSH: 0.665 u[IU]/mL (ref 0.320–4.118)

## 2017-04-23 MED ORDER — DENOSUMAB 120 MG/1.7ML ~~LOC~~ SOLN
120.0000 mg | Freq: Once | SUBCUTANEOUS | Status: AC
  Administered 2017-04-23: 120 mg via SUBCUTANEOUS

## 2017-04-23 MED ORDER — SODIUM CHLORIDE 0.9 % IV SOLN
240.0000 mg | Freq: Once | INTRAVENOUS | Status: AC
  Administered 2017-04-23: 240 mg via INTRAVENOUS
  Filled 2017-04-23: qty 24

## 2017-04-23 MED ORDER — DENOSUMAB 120 MG/1.7ML ~~LOC~~ SOLN
SUBCUTANEOUS | Status: AC
  Filled 2017-04-23: qty 1.7

## 2017-04-23 MED ORDER — SODIUM CHLORIDE 0.9 % IV SOLN
Freq: Once | INTRAVENOUS | Status: AC
  Administered 2017-04-23: 11:00:00 via INTRAVENOUS

## 2017-04-23 NOTE — Patient Instructions (Signed)
[facility] Discharge Instructions for Patients Receiving Chemotherapy  Today you received the following chemotherapy agents :  Nivolumab,  Xgeva.  To help prevent nausea and vomiting after your treatment, we encourage you to take your nausea medication as prescribed.   If you develop nausea and vomiting that is not controlled by your nausea medication, call the clinic.   BELOW ARE SYMPTOMS THAT SHOULD BE REPORTED IMMEDIATELY:  *FEVER GREATER THAN 100.5 F  *CHILLS WITH OR WITHOUT FEVER  NAUSEA AND VOMITING THAT IS NOT CONTROLLED WITH YOUR NAUSEA MEDICATION  *UNUSUAL SHORTNESS OF BREATH  *UNUSUAL BRUISING OR BLEEDING  TENDERNESS IN MOUTH AND THROAT WITH OR WITHOUT PRESENCE OF ULCERS  *URINARY PROBLEMS  *BOWEL PROBLEMS  UNUSUAL RASH Items with * indicate a potential emergency and should be followed up as soon as possible.  Feel free to call the clinic should you have any questions or concerns. The clinic phone number is [phone].  Please show the [facility] at check-in to the Emergency Department and triage nurse.

## 2017-04-23 NOTE — Telephone Encounter (Signed)
Gave avs and calendar for January - march

## 2017-04-23 NOTE — Progress Notes (Signed)
Pt saw Dr. Burr Medico today prior to chemo.  OK to treat with Crea 1.32 as per Dr. Burr Medico.

## 2017-04-25 ENCOUNTER — Telehealth: Payer: Self-pay | Admitting: *Deleted

## 2017-04-25 MED ORDER — METOPROLOL SUCCINATE ER 50 MG PO TB24: ORAL_TABLET | ORAL | 6 refills | Status: DC

## 2017-04-25 NOTE — Telephone Encounter (Signed)
Noted. HR should tolerate higher Toprol XL dose. Let's increase dose to 1.5 tablets daily (75mg  daily.) New dose sent to pharmacy. Monitor BP at home and update me with readings in another 2 wks.

## 2017-04-25 NOTE — Telephone Encounter (Signed)
Copied from [facility] [id number]. Topic: General - Other >> Apr 25, 2017 12:07 PM Carolyn Stare wrote:  Pt call to say his bp has been running high     would like a back 140/95 the past 2 days   Spoke to patient and was advised that he does is not having any symptoms. Patient stated that he just wanted to let Dr. Darnell Level know about his BP readings and wanted his advise. Patient stated that he feels fine.  Pharmacy : [facility]

## 2017-04-25 NOTE — Telephone Encounter (Signed)
Spoke with pt relaying message per Dr. Oneita Jolly ok and he will update Dr. Darnell Level.

## 2017-04-30 ENCOUNTER — Ambulatory Visit: Payer: Self-pay | Admitting: *Deleted

## 2017-04-30 MED ORDER — AMLODIPINE BESYLATE 5 MG PO TABS: 5.0000 mg | ORAL_TABLET | Freq: Every day | ORAL | 3 refills | Status: DC

## 2017-04-30 NOTE — Telephone Encounter (Signed)
Pt   Has  No  Symptoms    bp  Is  Elevated    Seen   By  Dr  Lucretia Roers  A  Few days  Pt  Has  Had  Recent  Medication  Changes   bp  Was   150/115  Both   Arms   This  Am   Pt  Was  Advised   To  Call  Back   If  Symptoms   Bp   At  1145   Is   149/114  At  1145  . Pt  [place]  Is  toprol   Was  increased  From  81  Mg  To  68  Mg    Last  Week    Pharmacy  Of  Choice  Is   [facility]  In  [place] .  Spoke  With   [employer]  And  Route  To  Office  .  Answer Assessment - Initial Assessment Questions 1. BLOOD PRESSURE: "What is the blood pressure?" "Did you take at least two measurements 5 minutes apart?"     150/115   2  Measurements   Both  Arms    At   1115   Am    145/108  At  1135    2. ONSET: "When did you take your blood pressure?"      See  Above   3. HOW: "How did you obtain the blood pressure?" (e.g., visiting nurse, automatic home BP monitor)       Home  Machine   4. HISTORY: "Do you have a history of high blood pressure?"       yes 5. MEDICATIONS: "Are you taking any medications for blood pressure?" "Have you missed any doses recently?"     toprol    Was  50  Mg  And  Increased   3  Days   6. OTHER SYMPTOMS: "Do you have any symptoms?" (e.g., headache, chest pain, blurred vision, difficulty breathing, weakness)       Has  No  Symptoms     7. PREGNANCY: "Is there any chance you are pregnant?" "When was your last menstrual period?"     none  Protocols used: HIGH BLOOD PRESSURE-A-AH

## 2017-04-30 NOTE — Addendum Note (Signed)
Addended by: [name] on: 04/30/2017 01:26 PM   Modules accepted: Orders

## 2017-04-30 NOTE — Telephone Encounter (Addendum)
Noted. Continue toprol XL 75mg  daily, add amlodipine 5mg  daily - sent to pharmacy. Watch for ankle swelling on this medication.  Schedule follow up next week (1 wk sooner than already scheduled).

## 2017-04-30 NOTE — Telephone Encounter (Signed)
Pt last seen 04/16/17.Please advise.

## 2017-05-01 ENCOUNTER — Telehealth: Payer: Self-pay

## 2017-05-01 NOTE — Telephone Encounter (Signed)
Left message on vm per dpr notifying pt, per Dr. Darnell Level, to continue the toprol XL 75 mg and add amlodipine 5 mg tab daily, which was sent to the pharmacy.  Needs to watch for ankle swelling.  Also, asked pt to call back to reschedule his 05/17/17 HTN f/u to a day next week instead, per Dr. Darnell Level.

## 2017-05-08 ENCOUNTER — Inpatient Hospital Stay: Payer: Medicare Other

## 2017-05-08 VITALS — BP 121/95 | HR 78 | Temp 97.6°F | Resp 18

## 2017-05-08 DIAGNOSIS — I10 Essential (primary) hypertension: Secondary | ICD-10-CM

## 2017-05-08 DIAGNOSIS — C7951 Secondary malignant neoplasm of bone: Secondary | ICD-10-CM | POA: Diagnosis not present

## 2017-05-08 DIAGNOSIS — C799 Secondary malignant neoplasm of unspecified site: Secondary | ICD-10-CM

## 2017-05-08 DIAGNOSIS — C439 Malignant melanoma of skin, unspecified: Secondary | ICD-10-CM

## 2017-05-08 DIAGNOSIS — C7800 Secondary malignant neoplasm of unspecified lung: Secondary | ICD-10-CM | POA: Diagnosis not present

## 2017-05-08 DIAGNOSIS — C7931 Secondary malignant neoplasm of brain: Secondary | ICD-10-CM | POA: Diagnosis not present

## 2017-05-08 DIAGNOSIS — I619 Nontraumatic intracerebral hemorrhage, unspecified: Secondary | ICD-10-CM

## 2017-05-08 DIAGNOSIS — Z5112 Encounter for antineoplastic immunotherapy: Secondary | ICD-10-CM | POA: Diagnosis not present

## 2017-05-08 DIAGNOSIS — C770 Secondary and unspecified malignant neoplasm of lymph nodes of head, face and neck: Secondary | ICD-10-CM | POA: Diagnosis not present

## 2017-05-08 LAB — COMPREHENSIVE METABOLIC PANEL
ALBUMIN: 3.5 g/dL (ref 3.5–5.0)
ALT: 15 U/L (ref 0–55)
ANION GAP: 9 (ref 3–11)
AST: 20 U/L (ref 5–34)
Alkaline Phosphatase: 55 U/L (ref 40–150)
BUN: 22 mg/dL (ref 7–26)
CHLORIDE: 107 mmol/L (ref 98–109)
CO2: 22 mmol/L (ref 22–29)
Calcium: 9.1 mg/dL (ref 8.4–10.4)
Creatinine, Ser: 1.33 mg/dL — ABNORMAL HIGH (ref 0.70–1.30)
GFR calc Af Amer: 58 mL/min — ABNORMAL LOW (ref >60)
GFR calc non Af Amer: 50 mL/min — ABNORMAL LOW (ref >60)
GLUCOSE: 149 mg/dL — AB (ref 70–140)
POTASSIUM: 4.9 mmol/L (ref 3.5–5.1)
Sodium: 138 mmol/L (ref 136–145)
Total Bilirubin: 0.4 mg/dL (ref 0.2–1.2)
Total Protein: 6.7 g/dL (ref 6.4–8.3)

## 2017-05-08 LAB — CBC WITH DIFFERENTIAL/PLATELET
Basophils Absolute: 0 10*3/uL (ref 0.0–0.1)
Basophils Relative: 0 %
Eosinophils Absolute: 0.2 10*3/uL (ref 0.0–0.5)
Eosinophils Relative: 4 %
HEMATOCRIT: 48.6 % (ref 38.4–49.9)
HEMOGLOBIN: 16.5 g/dL (ref 13.0–17.1)
LYMPHS PCT: 9 %
Lymphs Abs: 0.5 10*3/uL — ABNORMAL LOW (ref 0.9–3.3)
MCH: 31.1 pg (ref 27.2–33.4)
MCHC: 34 g/dL (ref 32.0–36.0)
MCV: 91.7 fL (ref 79.3–98.0)
MONO ABS: 0.5 10*3/uL (ref 0.1–0.9)
MONOS PCT: 10 %
NEUTROS ABS: 3.7 10*3/uL (ref 1.5–6.5)
Neutrophils Relative %: 77 %
Platelets: 176 10*3/uL (ref 140–400)
RBC: 5.3 MIL/uL (ref 4.20–5.82)
RDW: 13.5 % (ref 11.0–15.6)
WBC: 4.9 10*3/uL (ref 4.0–10.3)

## 2017-05-08 LAB — TSH: TSH: 2.748 u[IU]/mL (ref 0.320–4.118)

## 2017-05-08 MED ORDER — SODIUM CHLORIDE 0.9 % IV SOLN
240.0000 mg | Freq: Once | INTRAVENOUS | Status: AC
  Administered 2017-05-08: 240 mg via INTRAVENOUS
  Filled 2017-05-08: qty 24

## 2017-05-08 MED ORDER — SODIUM CHLORIDE 0.9 % IV SOLN
Freq: Once | INTRAVENOUS | Status: AC
  Administered 2017-05-08: 11:00:00 via INTRAVENOUS

## 2017-05-08 NOTE — Patient Instructions (Signed)
[facility] Discharge Instructions for Patients Receiving Chemotherapy  Today you received the following chemotherapy agent:  Nivolumab (Opdivo)  To help prevent nausea and vomiting after your treatment, we encourage you to take your nausea medication as prescribed.   If you develop nausea and vomiting that is not controlled by your nausea medication, call the clinic.   BELOW ARE SYMPTOMS THAT SHOULD BE REPORTED IMMEDIATELY:  *FEVER GREATER THAN 100.5 F  *CHILLS WITH OR WITHOUT FEVER  NAUSEA AND VOMITING THAT IS NOT CONTROLLED WITH YOUR NAUSEA MEDICATION  *UNUSUAL SHORTNESS OF BREATH  *UNUSUAL BRUISING OR BLEEDING  TENDERNESS IN MOUTH AND THROAT WITH OR WITHOUT PRESENCE OF ULCERS  *URINARY PROBLEMS  *BOWEL PROBLEMS  UNUSUAL RASH Items with * indicate a potential emergency and should be followed up as soon as possible.  Feel free to call the clinic should you have any questions or concerns. The clinic phone number is [phone].  Please show the [facility] at check-in to the Emergency Department and triage nurse.

## 2017-05-09 ENCOUNTER — Other Ambulatory Visit: Payer: Self-pay | Admitting: Family Medicine

## 2017-05-09 DIAGNOSIS — E785 Hyperlipidemia, unspecified: Secondary | ICD-10-CM

## 2017-05-09 DIAGNOSIS — R7303 Prediabetes: Secondary | ICD-10-CM

## 2017-05-09 DIAGNOSIS — N400 Enlarged prostate without lower urinary tract symptoms: Secondary | ICD-10-CM

## 2017-05-09 DIAGNOSIS — E039 Hypothyroidism, unspecified: Secondary | ICD-10-CM

## 2017-05-09 DIAGNOSIS — E349 Endocrine disorder, unspecified: Secondary | ICD-10-CM

## 2017-05-10 ENCOUNTER — Other Ambulatory Visit (INDEPENDENT_AMBULATORY_CARE_PROVIDER_SITE_OTHER): Payer: Medicare Other

## 2017-05-10 DIAGNOSIS — N400 Enlarged prostate without lower urinary tract symptoms: Secondary | ICD-10-CM | POA: Diagnosis not present

## 2017-05-10 DIAGNOSIS — E349 Endocrine disorder, unspecified: Secondary | ICD-10-CM

## 2017-05-10 DIAGNOSIS — E785 Hyperlipidemia, unspecified: Secondary | ICD-10-CM

## 2017-05-10 DIAGNOSIS — R7303 Prediabetes: Secondary | ICD-10-CM | POA: Diagnosis not present

## 2017-05-10 LAB — LIPID PANEL
Cholesterol: 178 mg/dL (ref 0–200)
HDL: 38.3 mg/dL — AB (ref >39.00)
NonHDL: 139.49
Total CHOL/HDL Ratio: 5
Triglycerides: 258 mg/dL — ABNORMAL HIGH (ref 0.0–149.0)
VLDL: 51.6 mg/dL — AB (ref 0.0–40.0)

## 2017-05-10 LAB — HEMOGLOBIN A1C: Hgb A1c MFr Bld: 5.8 % (ref 4.6–6.5)

## 2017-05-10 LAB — LDL CHOLESTEROL, DIRECT: Direct LDL: 98 mg/dL

## 2017-05-10 LAB — PSA: PSA: 4.9 ng/mL — AB (ref 0.10–4.00)

## 2017-05-16 LAB — TESTOS,TOTAL,FREE AND SHBG (FEMALE)
Free Testosterone: 85 pg/mL (ref 30.0–135.0)
SEX HORMONE BINDING: 38 nmol/L (ref 22–77)
TESTOSTERONE, TOTAL, LC-MS-MS: 500 ng/dL (ref 250–1100)

## 2017-05-17 ENCOUNTER — Ambulatory Visit: Payer: Medicare Other | Admitting: Family Medicine

## 2017-05-21 ENCOUNTER — Inpatient Hospital Stay (HOSPITAL_BASED_OUTPATIENT_CLINIC_OR_DEPARTMENT_OTHER): Payer: Medicare Other | Admitting: Nurse Practitioner

## 2017-05-21 ENCOUNTER — Telehealth: Payer: Self-pay

## 2017-05-21 ENCOUNTER — Inpatient Hospital Stay: Payer: Medicare Other | Attending: Hematology

## 2017-05-21 ENCOUNTER — Encounter: Payer: Self-pay | Admitting: Nurse Practitioner

## 2017-05-21 ENCOUNTER — Inpatient Hospital Stay: Payer: Medicare Other

## 2017-05-21 VITALS — BP 129/91 | HR 80 | Temp 98.0°F | Resp 18 | Ht 68.0 in | Wt 205.3 lb

## 2017-05-21 DIAGNOSIS — Z79899 Other long term (current) drug therapy: Secondary | ICD-10-CM | POA: Diagnosis not present

## 2017-05-21 DIAGNOSIS — R5383 Other fatigue: Secondary | ICD-10-CM | POA: Diagnosis not present

## 2017-05-21 DIAGNOSIS — C7800 Secondary malignant neoplasm of unspecified lung: Secondary | ICD-10-CM | POA: Insufficient documentation

## 2017-05-21 DIAGNOSIS — C7931 Secondary malignant neoplasm of brain: Secondary | ICD-10-CM

## 2017-05-21 DIAGNOSIS — N179 Acute kidney failure, unspecified: Secondary | ICD-10-CM

## 2017-05-21 DIAGNOSIS — N183 Chronic kidney disease, stage 3 unspecified: Secondary | ICD-10-CM

## 2017-05-21 DIAGNOSIS — D649 Anemia, unspecified: Secondary | ICD-10-CM

## 2017-05-21 DIAGNOSIS — C799 Secondary malignant neoplasm of unspecified site: Secondary | ICD-10-CM

## 2017-05-21 DIAGNOSIS — C7951 Secondary malignant neoplasm of bone: Secondary | ICD-10-CM | POA: Diagnosis not present

## 2017-05-21 DIAGNOSIS — Z5112 Encounter for antineoplastic immunotherapy: Secondary | ICD-10-CM | POA: Diagnosis not present

## 2017-05-21 DIAGNOSIS — I2510 Atherosclerotic heart disease of native coronary artery without angina pectoris: Secondary | ICD-10-CM

## 2017-05-21 DIAGNOSIS — F329 Major depressive disorder, single episode, unspecified: Secondary | ICD-10-CM

## 2017-05-21 DIAGNOSIS — C439 Malignant melanoma of skin, unspecified: Secondary | ICD-10-CM | POA: Insufficient documentation

## 2017-05-21 DIAGNOSIS — E785 Hyperlipidemia, unspecified: Secondary | ICD-10-CM

## 2017-05-21 DIAGNOSIS — I129 Hypertensive chronic kidney disease with stage 1 through stage 4 chronic kidney disease, or unspecified chronic kidney disease: Secondary | ICD-10-CM

## 2017-05-21 DIAGNOSIS — E291 Testicular hypofunction: Secondary | ICD-10-CM

## 2017-05-21 DIAGNOSIS — C770 Secondary and unspecified malignant neoplasm of lymph nodes of head, face and neck: Secondary | ICD-10-CM | POA: Diagnosis not present

## 2017-05-21 DIAGNOSIS — L84 Corns and callosities: Secondary | ICD-10-CM

## 2017-05-21 DIAGNOSIS — I10 Essential (primary) hypertension: Secondary | ICD-10-CM

## 2017-05-21 DIAGNOSIS — G4700 Insomnia, unspecified: Secondary | ICD-10-CM

## 2017-05-21 DIAGNOSIS — I619 Nontraumatic intracerebral hemorrhage, unspecified: Secondary | ICD-10-CM

## 2017-05-21 DIAGNOSIS — R4189 Other symptoms and signs involving cognitive functions and awareness: Secondary | ICD-10-CM

## 2017-05-21 LAB — CBC WITH DIFFERENTIAL/PLATELET
BASOS PCT: 0 %
Basophils Absolute: 0 10*3/uL (ref 0.0–0.1)
EOS ABS: 0.3 10*3/uL (ref 0.0–0.5)
EOS PCT: 5 %
HCT: 47.6 % (ref 38.4–49.9)
HEMOGLOBIN: 16 g/dL (ref 13.0–17.1)
Lymphocytes Relative: 14 %
Lymphs Abs: 0.7 10*3/uL — ABNORMAL LOW (ref 0.9–3.3)
MCH: 31.2 pg (ref 27.2–33.4)
MCHC: 33.6 g/dL (ref 32.0–36.0)
MCV: 92.8 fL (ref 79.3–98.0)
MONOS PCT: 8 %
Monocytes Absolute: 0.4 10*3/uL (ref 0.1–0.9)
NEUTROS PCT: 73 %
Neutro Abs: 3.4 10*3/uL (ref 1.5–6.5)
PLATELETS: 149 10*3/uL (ref 140–400)
RBC: 5.13 MIL/uL (ref 4.20–5.82)
RDW: 13.9 % (ref 11.0–14.6)
WBC: 4.7 10*3/uL (ref 4.0–10.3)

## 2017-05-21 LAB — COMPREHENSIVE METABOLIC PANEL
ALBUMIN: 3.7 g/dL (ref 3.5–5.0)
ALT: 16 U/L (ref 0–55)
AST: 21 U/L (ref 5–34)
Alkaline Phosphatase: 57 U/L (ref 40–150)
Anion gap: 9 (ref 3–11)
BUN: 36 mg/dL — AB (ref 7–26)
CHLORIDE: 110 mmol/L — AB (ref 98–109)
CO2: 22 mmol/L (ref 22–29)
CREATININE: 1.4 mg/dL — AB (ref 0.70–1.30)
Calcium: 9.5 mg/dL (ref 8.4–10.4)
GFR calc Af Amer: 54 mL/min — ABNORMAL LOW (ref >60)
GFR, EST NON AFRICAN AMERICAN: 47 mL/min — AB (ref >60)
GLUCOSE: 137 mg/dL (ref 70–140)
POTASSIUM: 4.7 mmol/L (ref 3.5–5.1)
SODIUM: 141 mmol/L (ref 136–145)
Total Bilirubin: 0.5 mg/dL (ref 0.2–1.2)
Total Protein: 6.8 g/dL (ref 6.4–8.3)

## 2017-05-21 LAB — TSH: TSH: 3.511 u[IU]/mL (ref 0.320–4.118)

## 2017-05-21 MED ORDER — HEPARIN SOD (PORK) LOCK FLUSH 100 UNIT/ML IV SOLN
500.0000 [IU] | Freq: Once | INTRAVENOUS | Status: DC | PRN
  Filled 2017-05-21: qty 5

## 2017-05-21 MED ORDER — SODIUM CHLORIDE 0.9 % IV SOLN
Freq: Once | INTRAVENOUS | Status: AC
  Administered 2017-05-21: 11:00:00 via INTRAVENOUS

## 2017-05-21 MED ORDER — DENOSUMAB 120 MG/1.7ML ~~LOC~~ SOLN
120.0000 mg | Freq: Once | SUBCUTANEOUS | Status: AC
  Administered 2017-05-21: 120 mg via SUBCUTANEOUS

## 2017-05-21 MED ORDER — DENOSUMAB 120 MG/1.7ML ~~LOC~~ SOLN
SUBCUTANEOUS | Status: AC
  Filled 2017-05-21: qty 1.7

## 2017-05-21 MED ORDER — SODIUM CHLORIDE 0.9% FLUSH
10.0000 mL | INTRAVENOUS | Status: DC | PRN
  Filled 2017-05-21: qty 10

## 2017-05-21 MED ORDER — SODIUM CHLORIDE 0.9 % IV SOLN
240.0000 mg | Freq: Once | INTRAVENOUS | Status: AC
  Administered 2017-05-21: 240 mg via INTRAVENOUS
  Filled 2017-05-21: qty 24

## 2017-05-21 NOTE — Progress Notes (Signed)
[place]  [phone] [phone]) [phone]  Clinic Follow up Note   Patient Care Team: [name], MD as PCP - General (Family Medicine) 05/21/2017  CHIEF COMPLAINT: F/u metastatic melanoma   SUMMARY OF ONCOLOGIC HISTORY: Oncology History   Metastatic melanoma ([place])   Staging form: Melanoma of the Skin, AJCC 7th Edition     Clinical stage from 09/21/2015: Stage IV [place], [place], [place]) - Signed by [name], MD on 10/09/2015       Malignant melanoma ([place])   09/16/2015 Imaging    PET scan showed a hypermetabolic 0.3JK lingular nodule, and a 103m right upper lobe lung nodule, left subclavicular nodes, and bone metastasis in the proximal right humerus and right femur.       09/21/2015 Initial Diagnosis    Metastatic melanoma ([place]      09/21/2015 Initial Biopsy    Left subclavicular lymph node biopsy showed prostatic of melanoma      09/21/2015 Miscellaneous    BRAF V600K mutation (+)       10/04/2015 Imaging    CT head with without contrast showed a 2.0 x 1.6 x 1.7 cm superficial left frontal hyperdense lesion, with postcontrast enhancement, lying within the previous hemorrhagic area, concerning for solitary melanoma metastasis      10/06/2015 - 09/06/2016 Chemotherapy    Nivolumab 2432mevery 2 weeks, held due to his dexamethasone for radionecrosis       10/13/2015 - 10/13/2015 Radiation Therapy    10/13/2015 Preop SRS treatment: 14 Gy  to the left frontal lesion in 1 fraction      10/15/2015 Surgery    left front craniotomy and tumor excision       10/15/2015 Pathology Results    left frontal brain mass excision showed metastatic melanoma, 1.5X1.5X0.6cm      01/13/2016 Imaging    CT head without and with contrast showed ill-defined enhancement and dural thickening of the left frontal resection cavity may represent a post treatment changes or residual neoplasm. A new 6 mm hypodensity in the right cerebellar hemisphere with uncertainty enhancement may  represent a metastasis or small brain parenchymal hemorrhage.       01/27/2016 - 01/27/2016 Radiation Therapy    01/27/16 SRS Treatment:  20 Gy in 1 fraction to a 6 mm right cerebellar brain met       03/29/2016 PET scan    PET 03/29/2016 IMPRESSION: 1. Overall there has been an interval response to therapy. The index lesions sided on previous exam it are less hypermetabolic when compared with the previous exam. 2. There is an intramuscular lesion within the right masseter which has increased FDG uptake compared with previous exam. 3. No new sites of disease identified. 4. Aortic atherosclerosis and infrarenal abdominal aortic aneurysm. Recommend followup by ultrasound in 3 years. This recommendation follows ACR consensus guidelines: White Paper of the ACR Incidental Findings Committee II on Vascular Findings. J Joellyn Ruedadiol 2013; 10:789-794      04/20/2016 Imaging    CT Head w wo contrast 1. Mild patchy enhancement at the left anterior frontal lobe treatment site (series 11, image 39) without mass effect and stable surrounding white matter hypodensity without strong evidence of acute vasogenic edema. Recommend continued CT surveillance. 2. No new metastatic disease or new intracranial abnormality identified. 3. Probable benign 14 mm left parietal bone lucent lesion, unchanged since May 2017.      05/06/2016 - 05/08/2016 Hospital Admission    Pt was admitted for rectal bleeding  for one day. Breathing spontaneously resolved, hemoglobin dropped from 15 to 10.5, did not require blood transfusion. Colonoscopy showed no active bleeding, except diverticulosis and hemorrhoid. He was discharged to home.      05/08/2016 Procedure    Colonoscopy  - Stool in the ascending colon. Fluid aspiration performed. - Diverticulosis in the entire examined colon. - Non-bleeding internal hemorrhoids. - The examination was otherwise normal on direct and retroflexion views.      05/29/2016  Imaging    PET Image Restaging IMPRESSION: 1. Stable exam with no evidence progression. 2. Photopenia in the LEFT frontal lobe at prior surgical site. 3. Stable mildly hypermetabolic RIGHT paratracheal node. This may represent a thyroid nodule. 4. Stable size of minimally metabolic lingular pulmonary nodule. 5. Stable skeletal lesion of the RIGHT iliac crest 6. No cutaneous metabolic lesion.      07/20/2016 Imaging    CT Head W WO Contrast IMPRESSION: Pronounced change in the left frontal region. Much more regional vasogenic edema. Relatively stable appearance of the abnormal enhancement extending from the lateral margin of the frontal horn of left lateral ventricle to the frontal brain surface. New region of abnormal enhancement surrounding the frontal horn of the left lateral ventricle measuring approximately 3.2 cm. The differential diagnosis is recurrent tumor versus radiation necrosis. The patient is reportedly not an MRI candidate. This area is large enough that CT perfusion could possibly make a reasonable differentiation.      09/02/2016 Imaging    CT Head 09/02/16 IMPRESSION: Somewhat mixed pattern of slight posterior progression of pleomorphic enhancement surrounding the LEFT lateral ventricle although no significant mass effect, and reduced vasogenic edema compared with the scan in April.      09/27/2016 - 11/02/2016 Chemotherapy    Start dabrafenib and trametinib 09/27/16, stopped due to severe fatigue        10/05/2016 Imaging    CT Head w & w/o contrast  IMPRESSION: 1. Decreased size of the enhancing area within the left frontal lobe treatment site. Surrounding edema has also decreased. The findings support continued evolution of radiation necrosis. 2. No new enhancing lesions.      10/18/2016 PET scan    IMPRESSION: 1. New focal hypermetabolism in the proximal sigmoid colon with associated focal colonic wall thickening and mild pericolonic fat stranding at  the site of a proximal sigmoid diverticulum, favored to represent mild acute sigmoid diverticulitis. Metastatic disease is considered less likely. Clinical correlation is necessary. Consider appropriate antibiotic therapy, as clinically warranted. Attention to this region recommended on future PET-CT follow-up. 2. Otherwise complete metabolic response with no evidence of hypermetabolic metastatic disease. Lingular pulmonary nodule is slightly decreased in size and demonstrates no significant metabolism. 3. No residual hypermetabolism in the right thyroid lobe nodule, which is stable in size. 4. Aortic Atherosclerosis (ICD10-I70.0). Stable 3.1 cm infrarenal abdominal aortic aneurysm. Aortic aneurysm NOS (ICD10-I71.9). Recommend followup by ultrasound in 3 years. This recommendation follows ACR consensus guidelines: White Paper of the ACR Incidental Findings Committee II on Vascular Findings. Joellyn Rued Radiol 2013; 51:761-607.      11/01/2016 - 11/04/2016 Hospital Admission    Patient admitted to the hospital due to hypercalcemia where he received IV fluids and pamidronate      12/13/2016 PET scan    PET 12/13/16 IMPRESSION: 1. No findings to suggest metastatic disease on today's examination. 2. Previously noted hypermetabolism in the sigmoid colon has resolved, presumably indicative of a focus of diverticulitis on the prior study. Today's study does demonstrate  relatively diffuse hypermetabolism throughout the cecum, ascending colon and proximal transverse colon where there is some mild mural thickening, favored to reflect mild chronic inflammation related to underlying diverticular disease. No definite inflammatory changes on the CT portion of the examination to strongly suggest an active acute diverticulitis at this time. No discrete lesion to suggest neoplasm. 3. Previously noted 8 mm lingular nodule is stable in size and known remains non hypermetabolic. This is nonspecific but  reassuring. Continued attention on future followup studies is recommended. 4. Aortic atherosclerosis, in addition to left main and 3 vessel coronary artery disease. Status post median sternotomy for CABG including LIMA to the LAD. In addition, there is mild fusiform aneurysmal dilatation of the infrarenal abdominal aorta (3.1 cm in diameter) as well as aneurysmal dilatation of the left common iliac      12/18/2016 -  Chemotherapy    Restarted nivolamab 267m every 2 weeks, with tapering dose dexamethasone        04/18/2017 Imaging    PET Scan IMPRESSION: 1. No hypermetabolic lesion to suggest active malignancy. 2. Stable 8 mm lingular nodule is not currently hypermetabolic but merit surveillance. Infrarenal abdominal aortic aneurysm 3.1 cm in diameter. Recommend followup by [facility] 3 years. 3. Other imaging findings of potential clinical significance: Postoperative findings in the left frontal lobe. Right renal cyst. Aortic Atherosclerosis (ICD10-I70.0). Osteoarthritis. Lumbar scoliosis. Left knee effusion. Colonic diverticulosis.      CURRENT THERAPY:  Restarted nivolamab on 12/18/2016 and changed to every 4 weeks on 01/30/2017, changed back to every 2 weeks due to fatigue on 04/23/17  INTERVAL HISTORY: Mr. HRoperreturns for follow up as scheduled prior to next cycle nivolumab. Last treated on 05/08/17. He feels "mediocre." Has morning fatigue and sluggishness; has been without CPAP since he cleaned it a few weeks ago and hasn't worked properly since then. He reports sleeping 6.5-8.5 hours per night. Has good appetite, no nausea, vomiting, constipation, or diarrhea. Denies neurologic changes, continues PT/OT twice per week. Denies fever, chills, cough, chest pain, dyspnea. No skin rash. He plans to see PCP later this week for routine f/u.    REVIEW OF SYSTEMS:   Constitutional: Denies fevers, chills or abnormal weight loss (+) fatigue, stable (+) morning sluggishness  Eyes: Denies blurriness of  vision Ears, nose, mouth, throat, and face: Denies mucositis or sore throat Respiratory: Denies cough, dyspnea or wheezes Cardiovascular: Denies palpitation, chest discomfort or lower extremity swelling Gastrointestinal:  Denies nausea, vomiting, constipation, diarrhea, heartburn or change in bowel habits Skin: Denies abnormal skin rashes Lymphatics: Denies new lymphadenopathy or easy bruising Neurological:Denies numbness, tingling or new weaknesses Behavioral/Psych: Mood is stable, no new changes (+) decreased memory  MSK: "legs don't work" All other systems were reviewed with the patient and are negative.  MEDICAL HISTORY:  Past Medical History:  Diagnosis Date  . Anginal pain ([place]   . Arthritis    mostly hands  . Benign familial hematuria   . BPH (benign prostatic hypertrophy)   . Brain cancer ([place]    melanoma with brain met  . Coronary artery disease   . GERD (gastroesophageal reflux disease)   . High cholesterol   . Hypertension   . Hypothyroidism   . Intracerebral hemorrhage ([place]   . Melanoma ([place]   . Metastatic melanoma to lung ([place]   . Mood disorder ([place]   . Myocardial infarction ([place]   . Pacemaker    due to syncope, 3rd degree HB [facility] (Dr.  Boyce)  . Rectal bleed    due to NSAIDS  . Renal disorder   . Skin cancer    melanoma  . Sleep apnea    uses CPAP  . Stroke [facility])     SURGICAL HISTORY: Past Surgical History:  Procedure Laterality Date  . APPLICATION OF CRANIAL NAVIGATION N/A 10/15/2015   Procedure: APPLICATION OF CRANIAL NAVIGATION;  Surgeon: Erline Levine, MD;  Location: [place] NEURO ORS;  Service: Neurosurgery;  Laterality: N/A;  . CARDIAC CATHETERIZATION  2013  . CARDIAC SURGERY     bypass X 2  . COLONOSCOPY WITH PROPOFOL N/A 05/08/2016   Procedure: COLONOSCOPY WITH PROPOFOL;  Surgeon: Jonathon Bellows, MD;  [facility] ENDOSCOPY;  Service: Gastroenterology;  Laterality: N/A;  . CORONARY ARTERY BYPASS GRAFT  2013    LIMA-LAD, SVG-PDA 03/27/11 (Dr. Francee Gentile)  . CRANIOTOMY N/A 10/15/2015   Procedure: LEFT FRONTAL CRANIOTOMY TUMOR EXCISION with Curve;  Surgeon: Erline Levine, MD;  Location: [place] NEURO ORS;  Service: Neurosurgery;  Laterality: N/A;  CRANIOTOMY TUMOR EXCISION  . EYE SURGERY    . FOOT SURGERY  08/2010  . JOINT REPLACEMENT Left    partial knee  . KNEE ARTHROSCOPY  12/2008  . LYMPH NODE BIOPSY    . PACEMAKER INSERTION    . TONSILLECTOMY      I have reviewed the social history and family history with the patient and they are unchanged from previous note.  ALLERGIES:  is allergic to zocor [simvastatin].  MEDICATIONS:  Current Outpatient Medications  Medication Sig Dispense Refill  . acetaminophen (TYLENOL) 500 MG tablet Take 500 mg by mouth at bedtime.    Marland Kitchen amLODipine (NORVASC) 5 MG tablet Take 1 tablet (5 mg total) by mouth daily. 30 tablet 3  . aspirin EC 81 MG tablet Take 81 mg by mouth daily.    Marland Kitchen levothyroxine (SYNTHROID, LEVOTHROID) 125 MCG tablet Take 1 tablet (125 mcg total) by mouth daily before breakfast. 30 tablet 6  . metoprolol succinate (TOPROL-XL) 50 MG 24 hr tablet Take 1.5 tablets daily (33m). Take with or immediately following a meal. 45 tablet 6  . pentoxifylline (TRENTAL) 400 MG CR tablet Take 1 tablet (400 mg total) by mouth 2 (two) times daily. 60 tablet 6  . rosuvastatin (CRESTOR) 10 MG tablet Take 10 mg by mouth at bedtime.    . sertraline (ZOLOFT) 100 MG tablet     . testosterone cypionate (DEPOTESTOTERONE CYPIONATE) 100 MG/ML injection Inject 100 mg into the muscle every 14 (fourteen) days.     . Turmeric 500 MG CAPS Take 2 capsules by mouth daily.    . vitamin E (VITAMIN E) 400 UNIT capsule Take 1 capsule (400 Units total) by mouth 2 (two) times daily. 60 capsule 6   No current facility-administered medications for this visit.    Facility-Administered Medications Ordered in Other Visits  Medication Dose Route Frequency Provider Last Rate Last Dose  . 0.9 %  sodium  chloride infusion  1,000 mL Intravenous Once FTruitt Merle MD        PHYSICAL EXAMINATION: ECOG PERFORMANCE STATUS: 1 - Symptomatic but completely ambulatory  Vitals:   05/21/17 0926  BP: (!) 129/91  Pulse: 80  Resp: 18  Temp: 98 F (36.7 C)  SpO2: 93%   Filed Weights   05/21/17 0926  Weight: 205 lb 4.8 oz (93.1 kg)    GENERAL:alert, no distress and comfortable SKIN: skin color, texture, turgor are normal, no rashes or significant lesions EYES: normal, Conjunctiva are pink and  non-injected, sclera clear OROPHARYNX:no exudate, no erythema and lips, buccal mucosa, and tongue normal  NECK: supple, thyroid normal size, non-tender, without nodularity LYMPH:  no palpable cervical, supraclavicular, axillary, or inguinal lymphadenopathy LUNGS: clear to auscultation bilaterally with normal breathing effort HEART: regular rate & rhythm and no murmurs and no lower extremity edema ABDOMEN:abdomen soft, non-tender and normal bowel sounds Musculoskeletal:no cyanosis of digits and no clubbing  NEURO: alert & oriented x 3 with fluent speech. Walks independently with normal gait.   LABORATORY DATA:  I have reviewed the data as listed CBC Latest Ref Rng & Units 05/21/2017 05/08/2017 04/23/2017  WBC 4.0 - 10.3 K/uL 4.7 4.9 4.9  Hemoglobin 13.0 - 17.1 g/dL 16.0 16.5 16.2  Hematocrit 38.4 - 49.9 % 47.6 48.6 48.9  Platelets 140 - 400 K/uL 149 176 176     CMP Latest Ref Rng & Units 05/21/2017 05/08/2017 04/23/2017  Glucose 70 - 140 mg/dL 137 149(H) 96  BUN 7 - 26 mg/dL 36(H) 22 25  Creatinine 0.70 - 1.30 mg/dL 1.40(H) 1.33(H) 1.32(H)  Sodium 136 - 145 mmol/L 141 138 142  Potassium 3.5 - 5.1 mmol/L 4.7 4.9 4.7  Chloride 98 - 109 mmol/L 110(H) 107 111(H)  CO2 22 - 29 mmol/L 22 22 23   Calcium 8.4 - 10.4 mg/dL 9.5 9.1 9.0  Total Protein 6.4 - 8.3 g/dL 6.8 6.7 6.8  Total Bilirubin 0.2 - 1.2 mg/dL 0.5 0.4 0.6  Alkaline Phos 40 - 150 U/L 57 55 52  AST 5 - 34 U/L 21 20 19   ALT 0 - 55 U/L 16 15 14     PATHOLOGY REPORT: Diagnosis 09/21/2015 Lymph node, needle/core biopsy, hypermetabolic left sub clavicular LN - METASTATIC MALIGNANT MELANOMA. - SEE COMMENT.  Diagnosis 10/15/2015 Brain, for tumor resection, Left frontal - METASTATIC MELANOMA.       RADIOGRAPHIC STUDIES: I have personally reviewed the radiological images as listed and agreed with the findings in the report. No results found.   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   1. Metastatic melanoma to lung, node, bone and brain, BRAF V600K mutation (+) 2. History of hemorrhagic stroke and brain metastasis  3. Memory loss and cognitive dysfunction 4. Hypercalcemia and AKI 5. Fatigue, hypothyroidism, hypotestosteronemia 6. Anemia and h/o GI bleeding 7. HTN 8. Depression 9. CKD, stage III 10. Goals of care discussion 11. CAD 12. Plantar callus on right foot 13. Insomnia  Mr. Hosie appears stable today. He completed another cycle of nivolumab on 05/08/17. He is tolerating well overall with chronic stable fatigue. He is still able to perform self care; continues PT/OT twice weekly. Sleep is improved, getting 6.5-8.5 hours per night. I encouraged him to be active and eat well. He noted CPAP is not working properly, I sent message to his PCP to help him address this, he has f/u this week. Weight stable, BP remains high but improved with increased beta blocker. Cr. Trending up lately to 1.4 today in the setting of CKD; it fluctuates. Will monitor closely. TSH normal but slightly trending up, 3.511 today, on synthroid. He reportedly missed his morning dose today but otherwise no missed doses. I reviewed to take in the morning on empty stomach. CBC stable. Labs adequate for treatment today. Will continue nivolumab every 2 weeks. Will f/u in 4 weeks with Dr. Burr Medico.   PLAN -labs  reviewed, proceed with next cycle nivolumab today, continue  q2 weeks -f/u with Dr. Burr Medico in 4 weeks -continue f/u with PCP, next on 2/13; to address CPAP at next visit   All questions were answered. The patient knows to call the clinic with any problems, questions or concerns. No barriers to learning was detected.     Alla Feeling, NP 05/21/17

## 2017-05-21 NOTE — Patient Instructions (Signed)
[place] Discharge Instructions for Patients Receiving Chemotherapy  Today you received the following chemotherapy agents:  Nivolumab & Xgeva.  To help prevent nausea and vomiting after your treatment, we encourage you to take your nausea medication as prescribed.   If you develop nausea and vomiting that is not controlled by your nausea medication, call the clinic.   BELOW ARE SYMPTOMS THAT SHOULD BE REPORTED IMMEDIATELY:  *FEVER GREATER THAN 100.5 F  *CHILLS WITH OR WITHOUT FEVER  NAUSEA AND VOMITING THAT IS NOT CONTROLLED WITH YOUR NAUSEA MEDICATION  *UNUSUAL SHORTNESS OF BREATH  *UNUSUAL BRUISING OR BLEEDING  TENDERNESS IN MOUTH AND THROAT WITH OR WITHOUT PRESENCE OF ULCERS  *URINARY PROBLEMS  *BOWEL PROBLEMS  UNUSUAL RASH Items with * indicate a potential emergency and should be followed up as soon as possible.  Feel free to call the clinic should you have any questions or concerns. The clinic phone number is [phone].  Please show the [place] at check-in to the Emergency Department and triage nurse.

## 2017-05-21 NOTE — Telephone Encounter (Signed)
Printed avs and calender for upcoming appointment. Per 2/11 los 

## 2017-05-22 ENCOUNTER — Ambulatory Visit: Payer: Medicare Other | Admitting: Family Medicine

## 2017-05-23 ENCOUNTER — Encounter: Payer: Self-pay | Admitting: Family Medicine

## 2017-05-23 ENCOUNTER — Ambulatory Visit (INDEPENDENT_AMBULATORY_CARE_PROVIDER_SITE_OTHER): Payer: Medicare Other | Admitting: Family Medicine

## 2017-05-23 VITALS — BP 118/82 | HR 81 | Temp 97.9°F | Wt 203.5 lb

## 2017-05-23 DIAGNOSIS — I255 Ischemic cardiomyopathy: Secondary | ICD-10-CM | POA: Diagnosis not present

## 2017-05-23 DIAGNOSIS — E349 Endocrine disorder, unspecified: Secondary | ICD-10-CM | POA: Diagnosis not present

## 2017-05-23 DIAGNOSIS — G4733 Obstructive sleep apnea (adult) (pediatric): Secondary | ICD-10-CM

## 2017-05-23 DIAGNOSIS — N400 Enlarged prostate without lower urinary tract symptoms: Secondary | ICD-10-CM | POA: Diagnosis not present

## 2017-05-23 DIAGNOSIS — E039 Hypothyroidism, unspecified: Secondary | ICD-10-CM

## 2017-05-23 DIAGNOSIS — I10 Essential (primary) hypertension: Secondary | ICD-10-CM

## 2017-05-23 DIAGNOSIS — C439 Malignant melanoma of skin, unspecified: Secondary | ICD-10-CM

## 2017-05-23 MED ORDER — AMLODIPINE BESYLATE 5 MG PO TABS: 5.0000 mg | ORAL_TABLET | Freq: Every day | ORAL | 3 refills | Status: DC

## 2017-05-23 MED ORDER — METOPROLOL SUCCINATE ER 50 MG PO TB24: ORAL_TABLET | ORAL | 3 refills | Status: DC

## 2017-05-23 NOTE — Assessment & Plan Note (Addendum)
Chronic, improved. Continue current regimen. Tolerating medication well.

## 2017-05-23 NOTE — Patient Instructions (Addendum)
See Rosaria Ferries our referral coordinator for referral to sleep doctor.  Continue current blood pressure medicines.

## 2017-05-23 NOTE — Progress Notes (Signed)
BP 118/82 (BP Location: Left Arm, Patient Position: Sitting, Cuff Size: Normal)   Pulse 81   Temp 97.9 F (36.6 C) (Oral)   Wt 203 lb 8 oz (92.3 kg)   SpO2 95%   BMI 30.94 kg/m    CC: HTN f/u visit Subjective:    Patient ID: David Stall., male    DOB: 1940/05/16, 77 y.o.   MRN: [id number]  HPI: David Dibiasio. is a 77 y.o. male presenting on 05/23/2017 for Hypertension (Here for follow-up. Started amlodipine 5 mg.)   HTN - Compliant with current antihypertensive regimen of amlodipine 5mg  daily (marked improvement on this) and toprol XL 75mg  daily. Tolerating medications well. Does check blood pressures at home: well controlled. No low blood pressure readings or symptoms of dizziness/syncope.  Denies HA, vision changes, CP/tightness, SOB, leg swelling.    OSA - his CPAP stopped working 2-3 wks ago - may have gotten water in device when he was cleaning this. He's had machine for 12 years. Last sleep study was 3-4 yrs ago, was followed by pulm at [facility]. Doesn't know what company he uses.   Relevant past medical, surgical, family and social history reviewed and updated as indicated. Interim medical history since our last visit reviewed. Allergies and medications reviewed and updated. Outpatient Medications Prior to Visit  Medication Sig Dispense Refill  . acetaminophen (TYLENOL) 500 MG tablet Take 500 mg by mouth at bedtime.    Marland Kitchen aspirin EC 81 MG tablet Take 81 mg by mouth daily.    Marland Kitchen levothyroxine (SYNTHROID, LEVOTHROID) 125 MCG tablet Take 1 tablet (125 mcg total) by mouth daily before breakfast. 30 tablet 6  . pentoxifylline (TRENTAL) 400 MG CR tablet Take 1 tablet (400 mg total) by mouth 2 (two) times daily. 60 tablet 6  . rosuvastatin (CRESTOR) 10 MG tablet Take 10 mg by mouth at bedtime.    . sertraline (ZOLOFT) 100 MG tablet     . testosterone cypionate (DEPOTESTOTERONE CYPIONATE) 100 MG/ML injection Inject 100 mg into the muscle every 14 (fourteen) days.       . Turmeric 500 MG CAPS Take 2 capsules by mouth daily.    . vitamin E (VITAMIN E) 400 UNIT capsule Take 1 capsule (400 Units total) by mouth 2 (two) times daily. 60 capsule 6  . amLODipine (NORVASC) 5 MG tablet Take 1 tablet (5 mg total) by mouth daily. 30 tablet 3  . metoprolol succinate (TOPROL-XL) 50 MG 24 hr tablet Take 1.5 tablets daily (75mg ). Take with or immediately following a meal. 45 tablet 6   Facility-Administered Medications Prior to Visit  Medication Dose Route Frequency Provider Last Rate Last Dose  . 0.9 %  sodium chloride infusion  1,000 mL Intravenous Once [name], MD         Per HPI unless specifically indicated in ROS section below Review of Systems     Objective:    BP 118/82 (BP Location: Left Arm, Patient Position: Sitting, Cuff Size: Normal)   Pulse 81   Temp 97.9 F (36.6 C) (Oral)   Wt 203 lb 8 oz (92.3 kg)   SpO2 95%   BMI 30.94 kg/m   Wt Readings from Last 3 Encounters:  05/23/17 203 lb 8 oz (92.3 kg)  05/21/17 205 lb 4.8 oz (93.1 kg)  04/23/17 203 lb 1.6 oz (92.1 kg)    Physical Exam  Constitutional: He appears well-developed and well-nourished. No distress.  HENT:  Mouth/Throat: Oropharynx is clear and moist.  No oropharyngeal exudate.  Cardiovascular: Normal rate, regular rhythm, normal heart sounds and intact distal pulses.  No murmur heard. Pulmonary/Chest: Effort normal and breath sounds normal. No respiratory distress. He has no wheezes. He has no rales.  Musculoskeletal: He exhibits no edema.  Skin: Skin is warm and dry. No rash noted.  Psychiatric: He has a normal mood and affect.  Nursing note and vitals reviewed.  Results for orders placed or performed in visit on 05/21/17  CBC with Differential  Result Value Ref Range   WBC 4.7 4.0 - 10.3 K/uL   RBC 5.13 4.20 - 5.82 MIL/uL   Hemoglobin 16.0 13.0 - 17.1 g/dL   HCT 47.6 38.4 - 49.9 %   MCV 92.8 79.3 - 98.0 fL   MCH 31.2 27.2 - 33.4 pg   MCHC 33.6 32.0 - 36.0 g/dL   RDW 13.9  11.0 - 14.6 %   Platelets 149 140 - 400 K/uL   Neutrophils Relative % 73 %   Neutro Abs 3.4 1.5 - 6.5 K/uL   Lymphocytes Relative 14 %   Lymphs Abs 0.7 (L) 0.9 - 3.3 K/uL   Monocytes Relative 8 %   Monocytes Absolute 0.4 0.1 - 0.9 K/uL   Eosinophils Relative 5 %   Eosinophils Absolute 0.3 0.0 - 0.5 K/uL   Basophils Relative 0 %   Basophils Absolute 0.0 0.0 - 0.1 K/uL  Comprehensive metabolic panel  Result Value Ref Range   Sodium 141 136 - 145 mmol/L   Potassium 4.7 3.5 - 5.1 mmol/L   Chloride 110 (H) 98 - 109 mmol/L   CO2 22 22 - 29 mmol/L   Glucose, Bld 137 70 - 140 mg/dL   BUN 36 (H) 7 - 26 mg/dL   Creatinine, Ser 1.40 (H) 0.70 - 1.30 mg/dL   Calcium 9.5 8.4 - 10.4 mg/dL   Total Protein 6.8 6.4 - 8.3 g/dL   Albumin 3.7 3.5 - 5.0 g/dL   AST 21 5 - 34 U/L   ALT 16 0 - 55 U/L   Alkaline Phosphatase 57 40 - 150 U/L   Total Bilirubin 0.5 0.2 - 1.2 mg/dL   GFR calc non Af Amer 47 (L) >60 mL/min   GFR calc Af Amer 54 (L) >60 mL/min   Anion gap 9 3 - 11  TSH  Result Value Ref Range   TSH 3.511 0.320 - 4.118 uIU/mL      Assessment & Plan:   Problem List Items Addressed This Visit    Acquired hypothyroidism    TSH at goal. Continue current regimen.       Relevant Medications   metoprolol succinate (TOPROL-XL) 50 MG 24 hr tablet   Benign essential HTN - Primary    Chronic, improved. Continue current regimen. Tolerating medication well.      Relevant Medications   amLODipine (NORVASC) 5 MG tablet   metoprolol succinate (TOPROL-XL) 50 MG 24 hr tablet   Benign prostatic hyperplasia    H/o this - see below.       Hypotestosteronemia    Reviewed latest labs with patient - T at goal on replacement however PSA trending up. Reviewed possible causes including enlarging prostate in h/o BPH vs small cancer - will need to closely monitor and if continued trend likely d/c T injections. Pt agrees with plan. He and I agree that we will not pursue urological evaluation at this time  given other comorbidities.       Malignant melanoma ([place])  Metastatic malignant melanoma to lungs skull brain. Sees onc regularly.       OSA (obstructive sleep apnea)    CPAP machine broke. Has not established with local sleep doctor. Will refer. He prefers [facility].       Relevant Orders   Ambulatory referral to Pulmonology       Meds ordered this encounter  Medications  . amLODipine (NORVASC) 5 MG tablet    Sig: Take 1 tablet (5 mg total) by mouth daily.    Dispense:  90 tablet    Refill:  3  . metoprolol succinate (TOPROL-XL) 50 MG 24 hr tablet    Sig: Take 1.5 tablets daily (75mg ). Take with or immediately following a meal.    Dispense:  135 tablet    Refill:  3   Orders Placed This Encounter  Procedures  . Ambulatory referral to Pulmonology    Referral Priority:   Routine    Referral Type:   Consultation    Referral Reason:   Specialty Services Required    Requested Specialty:   Pulmonary Disease    Number of Visits Requested:   1    Follow up plan: Return in about 3 months (around 08/20/2017) for follow up visit.  [name], MD

## 2017-05-24 ENCOUNTER — Ambulatory Visit
Admission: RE | Admit: 2017-05-24 | Discharge: 2017-05-24 | Disposition: A | Discharge status: Home / Self Care | Payer: Medicare Other | Source: Ambulatory Visit | Attending: Internal Medicine | Admitting: Internal Medicine

## 2017-05-24 ENCOUNTER — Encounter: Payer: Self-pay | Admitting: Internal Medicine

## 2017-05-24 ENCOUNTER — Ambulatory Visit (INDEPENDENT_AMBULATORY_CARE_PROVIDER_SITE_OTHER): Payer: Medicare Other | Admitting: Internal Medicine

## 2017-05-24 VITALS — BP 130/86 | HR 82 | Ht 68.0 in | Wt 204.0 lb

## 2017-05-24 DIAGNOSIS — G4733 Obstructive sleep apnea (adult) (pediatric): Secondary | ICD-10-CM | POA: Diagnosis not present

## 2017-05-24 DIAGNOSIS — R0602 Shortness of breath: Secondary | ICD-10-CM | POA: Diagnosis not present

## 2017-05-24 DIAGNOSIS — R0609 Other forms of dyspnea: Secondary | ICD-10-CM | POA: Insufficient documentation

## 2017-05-24 DIAGNOSIS — I255 Ischemic cardiomyopathy: Secondary | ICD-10-CM | POA: Diagnosis not present

## 2017-05-24 NOTE — Addendum Note (Signed)
Addended by: [name] on: 05/24/2017 07:52 AM   Modules accepted: Level of Service

## 2017-05-24 NOTE — Patient Instructions (Addendum)
Will refer you to [facility] in [place].   Will need to get a copy of your old sleep study. Will order Auto-CPAP with pressure range of 5-20  however insurance may require [place] to perform a new sleep study.

## 2017-05-24 NOTE — Addendum Note (Signed)
Addended by: Oscar La R on: 05/24/2017 11:10 AM   Modules accepted: Orders

## 2017-05-24 NOTE — Assessment & Plan Note (Signed)
H/o this - see below.

## 2017-05-24 NOTE — Progress Notes (Addendum)
[facility] Consultation      Assessment and Plan:  Obstructive sleep apnea. -History of obstructive sleep apnea, symptoms are now worse now that his CPAP is no longer working. - We will obtain a copy of his old sleep study, and order a new CPAP.  We may be required to order a new sleep study per insurance requirements.  Ischemic cardiomyopathy. -Follows with cardiology, status post pacemaker.  Metastatic melanoma. - With metastases to lung, left subclavicular nodes, bone, brain, status post left frontal craniotomy with brain metastases.  Hemorrhagic stroke. -Likely secondary to metastatic melanoma. -Expressive aphasia  Orders Placed This Encounter  Procedures  . DG Chest 2 View  . Ambulatory referral to Cardiology   Return in about 3 months (around 08/21/2017).   Date: 05/24/2017  MRN# [id number] David [name]. [birth year]   David [name]. is a 77 y.o. old male seen in consultation for chief complaint of:    Chief Complaint  Patient presents with  . Advice Only    sleep issues: daytime sleepiness:    HPI:   Patient is a 77 year old male with a usually goes to bed at 11 PM, falls asleep within 1 hour, gets out of bed at 8 AM.  Pt is present with his sister who provides some of the history is a patient is somewhat confused.   He had a sleep study more than 10 years ago, and was using cpap regularly and did welll with it, it stopped working 2-3 weeks ago and no longer blows. He is more sleepy during the day, he wakes feeling that he did not sleep well. The original sleep study showed was done in [place].   He has metastatic melanoma with mets to brain, and had a brain bleed which required surgical craniotomy in 2017, he has also had radiation to a right cerebellar brain metastases.He has a history of 3rd degree HB s/p pacemaker.   He denies breathing difficulties  Addendum: Review and summary of outside records 07/13/17: **Spirometry  shows FVC of 106% predicted, FEV1 is up and 22% predicted, ratio 77%, there is no significant improvement bronchodilator.  Flow volume loop is normal.  Consistent with normal pulmonary functions. **Baseline sleep study 01/14/2007 severe obstructive sleep apnea with AHI of 60 **CPAP titration study 02/07/2007; BiPAP started 8/4 patient was noted to have complex sleep apnea syndrome, best treated at BiPAP 18/14. Most recent follow-ups 03/23/2009 showed continued use of BiPAP with good tolerance and compliance.    PMHX:   Past Medical History:  Diagnosis Date  . Anginal pain ([place])   . Arthritis    mostly hands  . Benign familial hematuria   . BPH (benign prostatic hypertrophy)   . Brain cancer ([place])    melanoma with brain met  . Coronary artery disease   . GERD (gastroesophageal reflux disease)   . High cholesterol   . Hypertension   . Hypothyroidism   . Intracerebral hemorrhage ([place])   . Melanoma ([place])   . Metastatic melanoma to lung ([place])   . Mood disorder ([place])   . Myocardial infarction ([place])   . Pacemaker    due to syncope, 3rd degree HB (upgrade to [facility]. Jude CRT-P 02/25/13 (Dr. Uvaldo Rising)  . Rectal bleed    due to NSAIDS  . Renal disorder   . Skin cancer    melanoma  . Sleep apnea    uses CPAP  . Stroke [facility])    Surgical Hx:  Past Surgical History:  Procedure Laterality Date  . APPLICATION OF CRANIAL NAVIGATION N/A 10/15/2015   Procedure: APPLICATION OF CRANIAL NAVIGATION;  Surgeon: Erline Levine, MD;  Location: [facility]URO ORS;  Service: Neurosurgery;  Laterality: N/A;  . CARDIAC CATHETERIZATION  2013  . CARDIAC SURGERY     bypass X 2  . COLONOSCOPY WITH PROPOFOL N/A 05/08/2016   Procedure: COLONOSCOPY WITH PROPOFOL;  Surgeon: Jonathon Bellows, MD;  Location: [facility] ENDOSCOPY;  Service: Gastroenterology;  Laterality: N/A;  . CORONARY ARTERY BYPASS GRAFT  2013   LIMA-LAD, SVG-PDA 03/27/11 (Dr. Francee Gentile)  . CRANIOTOMY N/A 10/15/2015   Procedure: LEFT FRONTAL CRANIOTOMY TUMOR EXCISION  with Curve;  Surgeon: Erline Levine, MD;  Location: [facility] NEURO ORS;  Service: Neurosurgery;  Laterality: N/A;  CRANIOTOMY TUMOR EXCISION  . EYE SURGERY    . FOOT SURGERY  08/2010  . JOINT REPLACEMENT Left    partial knee  . KNEE ARTHROSCOPY  12/2008  . LYMPH NODE BIOPSY    . PACEMAKER INSERTION    . TONSILLECTOMY     Family Hx:  Family History  Problem Relation Age of Onset  . Cancer Mother 21       lung   . Hypertension Mother   . Hypertension Father   . Heart attack Father   . Heart disease Father   . Rheum arthritis Sister   . Cancer Maternal Grandmother    Social Hx:   Social History   Tobacco Use  . Smoking status: Former Smoker    Packs/day: 1.00    Years: 3.00    Pack years: 3.00    Last attempt to quit: 04/10/1957    Years since quitting: 60.1  . Smokeless tobacco: Never Used  Substance Use Topics  . Alcohol use: Yes    Alcohol/week: 24.0 oz    Types: 30 Glasses of wine, 10 Cans of beer per week    Comment: 10 per week, wine per week  . Drug use: No   Medication:    Current Outpatient Medications:  .  acetaminophen (TYLENOL) 500 MG tablet, Take 500 mg by mouth at bedtime., Disp: , Rfl:  .  amLODipine (NORVASC) 5 MG tablet, Take 1 tablet (5 mg total) by mouth daily., Disp: 90 tablet, Rfl: 3 .  aspirin EC 81 MG tablet, Take 81 mg by mouth daily., Disp: , Rfl:  .  levothyroxine (SYNTHROID, LEVOTHROID) 125 MCG tablet, Take 1 tablet (125 mcg total) by mouth daily before breakfast., Disp: 30 tablet, Rfl: 6 .  metoprolol succinate (TOPROL-XL) 50 MG 24 hr tablet, Take 1.5 tablets daily (18m). Take with or immediately following a meal., Disp: 135 tablet, Rfl: 3 .  pentoxifylline (TRENTAL) 400 MG CR tablet, Take 1 tablet (400 mg total) by mouth 2 (two) times daily., Disp: 60 tablet, Rfl: 6 .  rosuvastatin (CRESTOR) 10 MG tablet, Take 10 mg by mouth at bedtime., Disp: , Rfl:  .  sertraline (ZOLOFT) 100 MG tablet, , Disp: , Rfl:  .  testosterone cypionate (DEPOTESTOTERONE  CYPIONATE) 100 MG/ML injection, Inject 100 mg into the muscle every 14 (fourteen) days. , Disp: , Rfl:  .  Turmeric 500 MG CAPS, Take 2 capsules by mouth daily., Disp: , Rfl:  .  vitamin E (VITAMIN E) 400 UNIT capsule, Take 1 capsule (400 Units total) by mouth 2 (two) times daily., Disp: 60 capsule, Rfl: 6 No current facility-administered medications for this visit.   Facility-Administered Medications Ordered in Other Visits:  .  0.9 %  sodium chloride infusion, 1,000 mL,  Intravenous, Once, [name], MD   Allergies:  Zocor [simvastatin]  Review of Systems: Gen:  Denies  fever, sweats, chills HEENT: Denies blurred vision, double vision. bleeds, sore throat Cvc:  No dizziness, chest pain. Resp:   Denies cough or sputum production, shortness of breath Gi: Denies swallowing difficulty, stomach pain. Gu:  Denies bladder incontinence, burning urine Ext:   No Joint pain, stiffness. Skin: No skin rash,  hives  Endoc:  No polyuria, polydipsia. Psych: No depression, insomnia. Other:  All other systems were reviewed with the patient and were negative other that what is mentioned in the HPI.   Physical Examination:   VS: BP 130/86 (BP Location: Left Arm, Cuff Size: Normal)   Pulse 82   Ht 5' 8"  (1.727 m)   Wt 204 lb (92.5 kg)   SpO2 96%   BMI 31.02 kg/m   General Appearance: No distress  Neuro:without focal findings, some difficulty finding words. HEENT: PERRLA, EOM intact.   Pulmonary: normal breath sounds, No wheezing.  CardiovascularNormal S1,S2.  No m/r/g.   Abdomen: Benign, Soft, non-tender. Renal:  No costovertebral tenderness  GU:  No performed at this time. Endoc: No evident thyromegaly, no signs of acromegaly. Skin:   warm, no rashes, no ecchymosis  Extremities: normal, no cyanosis, clubbing.  Other findings:    LABORATORY PANEL:   CBC Recent Labs  Lab 05/21/17 0853  WBC 4.7  HGB 16.0  HCT 47.6  PLT 149    ------------------------------------------------------------------------------------------------------------------  Chemistries  Recent Labs  Lab 05/21/17 0853  NA 141  K 4.7  CL 110*  CO2 22  GLUCOSE 137  BUN 36*  CREATININE 1.40*  CALCIUM 9.5  AST 21  ALT 16  ALKPHOS 57  BILITOT 0.5   ------------------------------------------------------------------------------------------------------------------  Cardiac Enzymes No results for input(s): TROPONINI in the last 168 hours. ------------------------------------------------------------  RADIOLOGY:  No results found.     Thank  you for the consultation and for allowing [facility] to assist in the care of your patient. Our recommendations are noted above.  Please contact us if we can be of further service.   Marda Stalker, MD.  Board Certified in Internal Medicine, Pulmonary Medicine, [place], and Sleep Medicine.  [facility] Office Number: [phone]  Patricia Pesa, M.D.  Merton Border, M.D  05/24/2017

## 2017-05-24 NOTE — Assessment & Plan Note (Signed)
TSH at goal. Continue current regimen.

## 2017-05-24 NOTE — Assessment & Plan Note (Signed)
CPAP machine broke. Has not established with local sleep doctor. Will refer. He prefers [facility].

## 2017-05-24 NOTE — Assessment & Plan Note (Addendum)
Metastatic malignant melanoma to lungs skull brain. Sees onc regularly.

## 2017-05-24 NOTE — Assessment & Plan Note (Addendum)
Reviewed latest labs with patient - T at goal on replacement however PSA trending up. Reviewed possible causes including enlarging prostate in h/o BPH vs small cancer - will need to closely monitor and if continued trend likely d/c T injections. Pt agrees with plan. He and I agree that we will not pursue urological evaluation at this time given other comorbidities.

## 2017-06-04 ENCOUNTER — Inpatient Hospital Stay: Payer: Medicare Other

## 2017-06-04 VITALS — BP 129/80 | HR 75 | Temp 97.5°F | Resp 17

## 2017-06-04 DIAGNOSIS — I619 Nontraumatic intracerebral hemorrhage, unspecified: Secondary | ICD-10-CM

## 2017-06-04 DIAGNOSIS — Z5112 Encounter for antineoplastic immunotherapy: Secondary | ICD-10-CM | POA: Diagnosis not present

## 2017-06-04 DIAGNOSIS — C439 Malignant melanoma of skin, unspecified: Secondary | ICD-10-CM | POA: Diagnosis not present

## 2017-06-04 DIAGNOSIS — C7931 Secondary malignant neoplasm of brain: Secondary | ICD-10-CM | POA: Diagnosis not present

## 2017-06-04 DIAGNOSIS — C770 Secondary and unspecified malignant neoplasm of lymph nodes of head, face and neck: Secondary | ICD-10-CM | POA: Diagnosis not present

## 2017-06-04 DIAGNOSIS — I10 Essential (primary) hypertension: Secondary | ICD-10-CM

## 2017-06-04 DIAGNOSIS — C799 Secondary malignant neoplasm of unspecified site: Secondary | ICD-10-CM

## 2017-06-04 DIAGNOSIS — C7800 Secondary malignant neoplasm of unspecified lung: Secondary | ICD-10-CM | POA: Diagnosis not present

## 2017-06-04 DIAGNOSIS — C7951 Secondary malignant neoplasm of bone: Secondary | ICD-10-CM | POA: Diagnosis not present

## 2017-06-04 LAB — CBC WITH DIFFERENTIAL/PLATELET
BASOS PCT: 1 %
Basophils Absolute: 0 10*3/uL (ref 0.0–0.1)
Eosinophils Absolute: 0.2 10*3/uL (ref 0.0–0.5)
Eosinophils Relative: 5 %
HEMATOCRIT: 48.6 % (ref 38.4–49.9)
HEMOGLOBIN: 16.2 g/dL (ref 13.0–17.1)
LYMPHS PCT: 11 %
Lymphs Abs: 0.5 10*3/uL — ABNORMAL LOW (ref 0.9–3.3)
MCH: 30.3 pg (ref 27.2–33.4)
MCHC: 33.3 g/dL (ref 32.0–36.0)
MCV: 90.9 fL (ref 79.3–98.0)
Monocytes Absolute: 0.4 10*3/uL (ref 0.1–0.9)
Monocytes Relative: 7 %
NEUTROS ABS: 3.6 10*3/uL (ref 1.5–6.5)
NEUTROS PCT: 76 %
Platelets: 151 10*3/uL (ref 140–400)
RBC: 5.35 MIL/uL (ref 4.20–5.82)
RDW: 14.5 % (ref 11.0–14.6)
WBC: 4.7 10*3/uL (ref 4.0–10.3)

## 2017-06-04 LAB — COMPREHENSIVE METABOLIC PANEL
ALBUMIN: 3.6 g/dL (ref 3.5–5.0)
ALT: 18 U/L (ref 0–55)
AST: 20 U/L (ref 5–34)
Alkaline Phosphatase: 50 U/L (ref 40–150)
Anion gap: 10 (ref 3–11)
BILIRUBIN TOTAL: 0.5 mg/dL (ref 0.2–1.2)
BUN: 32 mg/dL — ABNORMAL HIGH (ref 7–26)
CALCIUM: 9.2 mg/dL (ref 8.4–10.4)
CO2: 21 mmol/L — ABNORMAL LOW (ref 22–29)
Chloride: 109 mmol/L (ref 98–109)
Creatinine, Ser: 1.37 mg/dL — ABNORMAL HIGH (ref 0.70–1.30)
GFR calc Af Amer: 56 mL/min — ABNORMAL LOW (ref >60)
GFR, EST NON AFRICAN AMERICAN: 48 mL/min — AB (ref >60)
GLUCOSE: 175 mg/dL — AB (ref 70–140)
POTASSIUM: 4.5 mmol/L (ref 3.5–5.1)
Sodium: 140 mmol/L (ref 136–145)
TOTAL PROTEIN: 6.6 g/dL (ref 6.4–8.3)

## 2017-06-04 LAB — TSH: TSH: 9.179 u[IU]/mL — ABNORMAL HIGH (ref 0.320–4.118)

## 2017-06-04 MED ORDER — SODIUM CHLORIDE 0.9 % IV SOLN
Freq: Once | INTRAVENOUS | Status: AC
  Administered 2017-06-04: 11:00:00 via INTRAVENOUS

## 2017-06-04 MED ORDER — SODIUM CHLORIDE 0.9 % IV SOLN
240.0000 mg | Freq: Once | INTRAVENOUS | Status: AC
  Administered 2017-06-04: 240 mg via INTRAVENOUS
  Filled 2017-06-04: qty 24

## 2017-06-04 NOTE — Patient Instructions (Signed)
[facility] Discharge Instructions for Patients Receiving Chemotherapy  Today you received the following chemotherapy agent:  Nivolumab (Opdivo)  To help prevent nausea and vomiting after your treatment, we encourage you to take your nausea medication as prescribed.   If you develop nausea and vomiting that is not controlled by your nausea medication, call the clinic.   BELOW ARE SYMPTOMS THAT SHOULD BE REPORTED IMMEDIATELY:  *FEVER GREATER THAN 100.5 F  *CHILLS WITH OR WITHOUT FEVER  NAUSEA AND VOMITING THAT IS NOT CONTROLLED WITH YOUR NAUSEA MEDICATION  *UNUSUAL SHORTNESS OF BREATH  *UNUSUAL BRUISING OR BLEEDING  TENDERNESS IN MOUTH AND THROAT WITH OR WITHOUT PRESENCE OF ULCERS  *URINARY PROBLEMS  *BOWEL PROBLEMS  UNUSUAL RASH Items with * indicate a potential emergency and should be followed up as soon as possible.  Feel free to call the clinic should you have any questions or concerns. The clinic phone number is [phone].  Please show the [facility] at check-in to the Emergency Department and triage nurse.

## 2017-06-06 ENCOUNTER — Other Ambulatory Visit: Payer: Self-pay | Admitting: Internal Medicine

## 2017-06-06 ENCOUNTER — Telehealth: Payer: Self-pay | Admitting: Internal Medicine

## 2017-06-06 DIAGNOSIS — G4733 Obstructive sleep apnea (adult) (pediatric): Secondary | ICD-10-CM

## 2017-06-06 NOTE — Telephone Encounter (Signed)
Patient is calling to let us know he needs a new cpap   He wants [place] to contact bonnie at [facility]  in [place]  About getting this machine ordered

## 2017-06-06 NOTE — Telephone Encounter (Signed)
Called patient and made him aware again that we need a copy of his sleep study before any orders can be placed. Patient seems very confused therefore, I asked if he would like for me to call his daugher Dr. [name]. Spoke with Dr. [name] and notified of the same. She will find out where sleep study was performed and our fax # has been provided. Nothing further needed until sleep study received.

## 2017-06-06 NOTE — Telephone Encounter (Signed)
Patient calling to let us know he left a msg with sleep office in [facility]  Patient states this is from 10-12 years ago   [phone]

## 2017-06-06 NOTE — Telephone Encounter (Signed)
Date of last sleep study 12/08/2006 per sleep center in [place]. Patient will need another sleep study.

## 2017-06-07 ENCOUNTER — Ambulatory Visit (INDEPENDENT_AMBULATORY_CARE_PROVIDER_SITE_OTHER): Payer: Medicare Other | Admitting: Podiatry

## 2017-06-07 ENCOUNTER — Encounter: Payer: Self-pay | Admitting: Podiatry

## 2017-06-07 DIAGNOSIS — L84 Corns and callosities: Secondary | ICD-10-CM

## 2017-06-07 DIAGNOSIS — Q828 Other specified congenital malformations of skin: Secondary | ICD-10-CM

## 2017-06-07 NOTE — Progress Notes (Signed)
This patient presents the office follow-up for a previously  diagnosed ulcer under the big toe joint, right foot.  He says the skin lesion under the big toe joint is doing well when using his orthoses.  He says he feels pain when walking barefoot without his insole.  He denies any drainage from the skin lesion at this visit.  He presents the office today for continued evaluation and treatment of this painful skin lesion.   General Appearance  Alert, conversant and in no acute stress.  Vascular  Dorsalis pedis and posterior pulses are palpable  bilaterally.  Capillary return is within normal limits  bilaterally. Temperature is within normal limits  Bilaterally.  Neurologic  [name]-Weinstein monofilament wire test within normal limits  bilaterally. Muscle power within normal limits bilaterally.  Nails Normotropic nails both feet.  Orthopedic  No limitations of motion of motion feet bilaterally.  No crepitus or effusions noted.  DJD 1st MPJ  B/L. Plantarflexed first metatarsal right foot.  Skin  Porokeratosis sub 1 right foot. There is a hemorrhagic subacromial noted in the center of this hyperkeratotic lesion.  No evidence of any fluctuance or drainage noted     No signs of infections or ulcers noted.     Porokeratosis  B/L    Debridement of hyperkeratotic tissue was performed right foot using a #15 blade.   He is to return to the office in 9 weeks for further evaluation and treatment.  Continue using orthoses.  RTC 9 weeks.   Gardiner Barefoot DPM

## 2017-06-08 ENCOUNTER — Ambulatory Visit (HOSPITAL_COMMUNITY): Payer: Medicare Other

## 2017-06-08 ENCOUNTER — Ambulatory Visit
Admission: RE | Admit: 2017-06-08 | Discharge: 2017-06-08 | Disposition: A | Discharge status: Home / Self Care | Payer: Medicare Other | Source: Ambulatory Visit | Attending: Internal Medicine | Admitting: Internal Medicine

## 2017-06-08 DIAGNOSIS — C7931 Secondary malignant neoplasm of brain: Secondary | ICD-10-CM | POA: Diagnosis not present

## 2017-06-08 DIAGNOSIS — C4490 Unspecified malignant neoplasm of skin, unspecified: Secondary | ICD-10-CM | POA: Diagnosis not present

## 2017-06-08 DIAGNOSIS — C801 Malignant (primary) neoplasm, unspecified: Secondary | ICD-10-CM | POA: Insufficient documentation

## 2017-06-08 MED ORDER — IOPAMIDOL (ISOVUE-300) INJECTION 61%
60.0000 mL | Freq: Once | INTRAVENOUS | Status: AC | PRN
  Administered 2017-06-08: 75 mL via INTRAVENOUS

## 2017-06-09 ENCOUNTER — Telehealth: Payer: Self-pay | Admitting: Family Medicine

## 2017-06-09 MED ORDER — LEVOTHYROXINE SODIUM 137 MCG PO TABS: 137.0000 ug | ORAL_TABLET | Freq: Every day | ORAL | 3 refills | Status: DC

## 2017-06-09 NOTE — Telephone Encounter (Signed)
plz notify - received latest labs from onc. Chemotherapy med likely affecting thyroid levels - now too low. Recommend we increase thyroid dose to 163mcg daily - new dose sent to pharmacy. Will continue to monitor thyroid levels closely via onc labs.

## 2017-06-09 NOTE — Telephone Encounter (Signed)
-----   Message from Jesse Fall, RN sent at 06/05/2017  4:45 PM EST ----- [name] is deferring to you for managing thyroid.  Thanks, Jesse Fall RN

## 2017-06-11 ENCOUNTER — Inpatient Hospital Stay: Payer: Medicare Other | Attending: Hematology | Admitting: Internal Medicine

## 2017-06-11 ENCOUNTER — Telehealth: Payer: Self-pay | Admitting: Internal Medicine

## 2017-06-11 ENCOUNTER — Encounter: Payer: Self-pay | Admitting: Internal Medicine

## 2017-06-11 VITALS — BP 131/100 | HR 81 | Temp 97.7°F | Resp 20 | Ht 68.0 in | Wt 206.0 lb

## 2017-06-11 DIAGNOSIS — C7951 Secondary malignant neoplasm of bone: Secondary | ICD-10-CM | POA: Insufficient documentation

## 2017-06-11 DIAGNOSIS — Z5112 Encounter for antineoplastic immunotherapy: Secondary | ICD-10-CM | POA: Insufficient documentation

## 2017-06-11 DIAGNOSIS — C7931 Secondary malignant neoplasm of brain: Secondary | ICD-10-CM

## 2017-06-11 DIAGNOSIS — C439 Malignant melanoma of skin, unspecified: Secondary | ICD-10-CM

## 2017-06-11 DIAGNOSIS — M6281 Muscle weakness (generalized): Secondary | ICD-10-CM | POA: Diagnosis not present

## 2017-06-11 DIAGNOSIS — C770 Secondary and unspecified malignant neoplasm of lymph nodes of head, face and neck: Secondary | ICD-10-CM | POA: Insufficient documentation

## 2017-06-11 NOTE — Progress Notes (Signed)
[facility] [address] [address], [place] [address] [id number]   Interval Evaluation  Date of Service: 06/11/17 Patient Name: Bessie Livingood. Patient MRN: [id number] Patient DOB: Sep 17, 1940 Provider: Ventura Sellers, MD  Identifying Statement:  Montarius Kitagawa. is a 77 y.o. male with melanoma, brain metastases  Primary Cancer: Melanoma Stage IV  Oncologic History: Oncology History   Metastatic melanoma [facility])   Staging form: Melanoma of the Skin, AJCC 7th Edition     Clinical stage from 09/21/2015: Stage IV [address], [place], [place]) - Signed by [name], MD on 10/09/2015       Malignant melanoma ([place])   09/16/2015 Imaging    PET scan showed a hypermetabolic 6.2MM lingular nodule, and a 51m right upper lobe lung nodule, left subclavicular nodes, and bone metastasis in the proximal right humerus and right femur.       09/21/2015 Initial Diagnosis    [place]      09/21/2015 Initial Biopsy    Left subclavicular lymph node biopsy showed prostatic of melanoma      09/21/2015 Miscellaneous    BRAF V600K mutation (+)       10/04/2015 Imaging    CT head with without contrast showed a 2.0 x 1.6 x 1.7 cm superficial left frontal hyperdense lesion, with postcontrast enhancement, lying within the previous hemorrhagic area, concerning for solitary melanoma metastasis      10/06/2015 - 09/06/2016 Chemotherapy    Nivolumab 2465mevery 2 weeks, held due to his dexamethasone for radionecrosis       10/13/2015 - 10/13/2015 Radiation Therapy    10/13/2015 Preop SRS treatment: 14 Gy  to the left frontal lesion in 1 fraction      10/15/2015 Surgery    left front craniotomy and tumor excision       10/15/2015 Pathology Results    left frontal brain mass excision showed metastatic melanoma, 1.5X1.5X0.6cm      01/13/2016 Imaging    CT head without and with contrast showed ill-defined enhancement and dural thickening of the left frontal  resection cavity may represent a post treatment changes or residual neoplasm. A new 6 mm hypodensity in the right cerebellar hemisphere with uncertainty enhancement may represent a metastasis or small brain parenchymal hemorrhage.       01/27/2016 - 01/27/2016 Radiation Therapy    01/27/16 SRS Treatment:  20 Gy in 1 fraction to a 6 mm right cerebellar brain met       03/29/2016 PET scan    PET 03/29/2016 IMPRESSION: 1. Overall there has been an interval response to therapy. The index lesions sided on previous exam it are less hypermetabolic when compared with the previous exam. 2. There is an intramuscular lesion within the right masseter which has increased FDG uptake compared with previous exam. 3. No new sites of disease identified. 4. Aortic atherosclerosis and infrarenal abdominal aortic aneurysm. Recommend followup by ultrasound in 3 years. This recommendation follows ACR consensus guidelines: White Paper of the ACR Incidental Findings Committee II on Vascular Findings. J Joellyn Ruedadiol 2013; 10:789-794      04/20/2016 Imaging    CT Head w wo contrast 1. Mild patchy enhancement at the left anterior frontal lobe treatment site (series 11, image 39) without mass effect and stable surrounding white matter hypodensity without strong evidence of acute vasogenic edema. Recommend continued CT surveillance. 2. No new metastatic disease or new intracranial abnormality identified. 3. Probable benign 14  mm left parietal bone lucent lesion, unchanged since May 2017.      05/06/2016 - 05/08/2016 Hospital Admission    Pt was admitted for rectal bleeding for one day. Breathing spontaneously resolved, hemoglobin dropped from 15 to 10.5, did not require blood transfusion. Colonoscopy showed no active bleeding, except diverticulosis and hemorrhoid. He was discharged to home.      05/08/2016 Procedure    Colonoscopy  - Stool in the ascending colon. Fluid aspiration performed. -  Diverticulosis in the entire examined colon. - Non-bleeding internal hemorrhoids. - The examination was otherwise normal on direct and retroflexion views.      05/29/2016 Imaging    PET Image Restaging IMPRESSION: 1. Stable exam with no evidence progression. 2. Photopenia in the LEFT frontal lobe at prior surgical site. 3. Stable mildly hypermetabolic RIGHT paratracheal node. This may represent a thyroid nodule. 4. Stable size of minimally metabolic lingular pulmonary nodule. 5. Stable skeletal lesion of the RIGHT iliac crest 6. No cutaneous metabolic lesion.      07/20/2016 Imaging    CT Head W WO Contrast IMPRESSION: Pronounced change in the left frontal region. Much more regional vasogenic edema. Relatively stable appearance of the abnormal enhancement extending from the lateral margin of the frontal horn of left lateral ventricle to the frontal brain surface. New region of abnormal enhancement surrounding the frontal horn of the left lateral ventricle measuring approximately 3.2 cm. The differential diagnosis is recurrent tumor versus radiation necrosis. The patient is reportedly not an MRI candidate. This area is large enough that CT perfusion could possibly make a reasonable differentiation.      09/02/2016 Imaging    CT Head 09/02/16 IMPRESSION: Somewhat mixed pattern of slight posterior progression of pleomorphic enhancement surrounding the LEFT lateral ventricle although no significant mass effect, and reduced vasogenic edema compared with the scan in April.      09/27/2016 - 11/02/2016 Chemotherapy    Start dabrafenib and trametinib 09/27/16, stopped due to severe fatigue        10/05/2016 Imaging    CT Head w & w/o contrast  IMPRESSION: 1. Decreased size of the enhancing area within the left frontal lobe treatment site. Surrounding edema has also decreased. The findings support continued evolution of radiation necrosis. 2. No new enhancing lesions.       10/18/2016 PET scan    IMPRESSION: 1. New focal hypermetabolism in the proximal sigmoid colon with associated focal colonic wall thickening and mild pericolonic fat stranding at the site of a proximal sigmoid diverticulum, favored to represent mild acute sigmoid diverticulitis. Metastatic disease is considered less likely. Clinical correlation is necessary. Consider appropriate antibiotic therapy, as clinically warranted. Attention to this region recommended on future PET-CT follow-up. 2. Otherwise complete metabolic response with no evidence of hypermetabolic metastatic disease. Lingular pulmonary nodule is slightly decreased in size and demonstrates no significant metabolism. 3. No residual hypermetabolism in the right thyroid lobe nodule, which is stable in size. 4. Aortic Atherosclerosis (ICD10-I70.0). Stable 3.1 cm infrarenal abdominal aortic aneurysm. Aortic aneurysm NOS (ICD10-I71.9). Recommend followup by ultrasound in 3 years. This recommendation follows ACR consensus guidelines: White Paper of the ACR Incidental Findings Committee II on Vascular Findings. Joellyn Rued Radiol 2013; 52:841-324.      11/01/2016 - 11/04/2016 Hospital Admission    Patient admitted to the hospital due to hypercalcemia where he received IV fluids and pamidronate      12/13/2016 PET scan    PET 12/13/16 IMPRESSION: 1. No findings to suggest metastatic  disease on today's examination. 2. Previously noted hypermetabolism in the sigmoid colon has resolved, presumably indicative of a focus of diverticulitis on the prior study. Today's study does demonstrate relatively diffuse hypermetabolism throughout the cecum, ascending colon and proximal transverse colon where there is some mild mural thickening, favored to reflect mild chronic inflammation related to underlying diverticular disease. No definite inflammatory changes on the CT portion of the examination to strongly suggest an active acute diverticulitis  at this time. No discrete lesion to suggest neoplasm. 3. Previously noted 8 mm lingular nodule is stable in size and known remains non hypermetabolic. This is nonspecific but reassuring. Continued attention on future followup studies is recommended. 4. Aortic atherosclerosis, in addition to left main and 3 vessel coronary artery disease. Status post median sternotomy for CABG including LIMA to the LAD. In addition, there is mild fusiform aneurysmal dilatation of the infrarenal abdominal aorta (3.1 cm in diameter) as well as aneurysmal dilatation of the left common iliac      12/18/2016 -  Chemotherapy    Restarted nivolamab 224m every 2 weeks, with tapering dose dexamethasone        04/18/2017 Imaging    PET Scan IMPRESSION: 1. No hypermetabolic lesion to suggest active malignancy. 2. Stable 8 mm lingular nodule is not currently hypermetabolic but merit surveillance. Infrarenal abdominal aortic aneurysm 3.1 cm in diameter. Recommend followup by [facility] 3 years. 3. Other imaging findings of potential clinical significance: Postoperative findings in the left frontal lobe. Right renal cyst. Aortic Atherosclerosis (ICD10-I70.0). Osteoarthritis. Lumbar scoliosis. Left knee effusion. Colonic diverticulosis.        Interval History:  COttis Vacha presents today for follow up.  He has no new complaints today.  He does continue to feel weakness in his right leg, although this may have improved a little.  He is hopeful to begin driving again soon. He otherwise denies seizures, headaches, neck or back pain.     Medications: Current Outpatient Medications on File Prior to Visit  Medication Sig Dispense Refill  . acetaminophen (TYLENOL) 500 MG tablet Take 500 mg by mouth at bedtime.    .Marland KitchenamLODipine (NORVASC) 5 MG tablet Take 1 tablet (5 mg total) by mouth daily. 90 tablet 3  . aspirin EC 81 MG tablet Take 81 mg by mouth daily.    .Marland Kitchenlevothyroxine (SYNTHROID, LEVOTHROID) 137 MCG tablet Take 1  tablet (137 mcg total) by mouth daily before breakfast. 30 tablet 3  . metoprolol succinate (TOPROL-XL) 50 MG 24 hr tablet Take 1.5 tablets daily (742m. Take with or immediately following a meal. 135 tablet 3  . pentoxifylline (TRENTAL) 400 MG CR tablet Take 1 tablet (400 mg total) by mouth 2 (two) times daily. 60 tablet 6  . rosuvastatin (CRESTOR) 10 MG tablet Take 10 mg by mouth at bedtime.    . sertraline (ZOLOFT) 100 MG tablet     . testosterone cypionate (DEPOTESTOTERONE CYPIONATE) 100 MG/ML injection Inject 100 mg into the muscle every 14 (fourteen) days.     . Turmeric 500 MG CAPS Take 2 capsules by mouth daily.    . vitamin E (VITAMIN E) 400 UNIT capsule Take 1 capsule (400 Units total) by mouth 2 (two) times daily. 60 capsule 6   Current Facility-Administered Medications on File Prior to Visit  Medication Dose Route Frequency Provider Last Rate Last Dose  . 0.9 %  sodium chloride infusion  1,000 mL Intravenous Once FeTruitt MerleMD  Allergies:  Allergies  Allergen Reactions  . Zocor [Simvastatin] Other (See Comments)    Reaction:  Gave pt a fever    Past Medical History:  Past Medical History:  Diagnosis Date  . Anginal pain ([place])   . Arthritis    mostly hands  . Benign familial hematuria   . BPH (benign prostatic hypertrophy)   . Brain cancer ([place])    melanoma with brain met  . Coronary artery disease   . GERD (gastroesophageal reflux disease)   . High cholesterol   . Hypertension   . Hypothyroidism   . Intracerebral hemorrhage ([place])   . Melanoma ([place])   . Metastatic melanoma to lung ([place])   . Mood disorder ([place])   . Myocardial infarction ([place])   . Pacemaker    due to syncope, 3rd degree HB (upgrade to [facility]. Jude CRT-P 02/25/13 (Dr. Uvaldo Rising)  . Rectal bleed    due to NSAIDS  . Renal disorder   . Skin cancer    melanoma  . Sleep apnea    uses CPAP  . Stroke [facility])    Past Surgical History:  Past Surgical History:  Procedure Laterality Date  . APPLICATION  OF CRANIAL NAVIGATION N/A 10/15/2015   Procedure: APPLICATION OF CRANIAL NAVIGATION;  Surgeon: Erline Levine, MD;  Location: [place] NEURO ORS;  Service: Neurosurgery;  Laterality: N/A;  . CARDIAC CATHETERIZATION  2013  . CARDIAC SURGERY     bypass X 2  . COLONOSCOPY WITH PROPOFOL N/A 05/08/2016   Procedure: COLONOSCOPY WITH PROPOFOL;  Surgeon: Jonathon Bellows, MD;  Location: [facility] ENDOSCOPY;  Service: Gastroenterology;  Laterality: N/A;  . CORONARY ARTERY BYPASS GRAFT  2013   LIMA-LAD, SVG-PDA 03/27/11 (Dr. Francee Gentile)  . CRANIOTOMY N/A 10/15/2015   Procedure: LEFT FRONTAL CRANIOTOMY TUMOR EXCISION with Curve;  Surgeon: Erline Levine, MD;  Location: [place] NEURO ORS;  Service: Neurosurgery;  Laterality: N/A;  CRANIOTOMY TUMOR EXCISION  . EYE SURGERY    . FOOT SURGERY  08/2010  . JOINT REPLACEMENT Left    partial knee  . KNEE ARTHROSCOPY  12/2008  . LYMPH NODE BIOPSY    . PACEMAKER INSERTION    . TONSILLECTOMY     Social History:  Social History   Socioeconomic History  . Marital status: Married    Spouse name: Not on file  . Number of children: 2  . Years of education: Not on file  . Highest education level: Not on file  Social Needs  . Financial resource strain: Not on file  . Food insecurity - worry: Not on file  . Food insecurity - inability: Not on file  . Transportation needs - medical: Not on file  . Transportation needs - non-medical: Not on file  Occupational History  . Occupation: retired Pharmacist, community  Tobacco Use  . Smoking status: Former Smoker    Packs/day: 1.00    Years: 3.00    Pack years: 3.00    Last attempt to quit: 04/10/1957    Years since quitting: 60.2  . Smokeless tobacco: Never Used  Substance and Sexual Activity  . Alcohol use: Yes    Alcohol/week: 24.0 oz    Types: 30 Glasses of wine, 10 Cans of beer per week    Comment: 10 per week, wine per week  . Drug use: No  . Sexual activity: No  Other Topics Concern  . Not on file  Social History Narrative   Lives with wife  [name]) with dementia in [facility]   Daughter Dr  Silas [place] and son   Occ: retired Pharmacist, community   Has living will   [name] is health care POA   Requests DNR-- done 09/14/15   Not sure about tube feeds   Family History:  Family History  Problem Relation Age of Onset  . Cancer Mother 55       lung   . Hypertension Mother   . Hypertension Father   . Heart attack Father   . Heart disease Father   . Rheum arthritis Sister   . Cancer Maternal Grandmother     Review of Systems: Constitutional: Denies fevers, chills or abnormal weight loss Eyes: Denies blurriness of vision Ears, nose, mouth, throat, and face: Denies mucositis or sore throat Respiratory: Denies cough, dyspnea or wheezes Cardiovascular: Denies palpitation, chest discomfort or lower extremity swelling Gastrointestinal:  Denies nausea, constipation, diarrhea GU: Denies dysuria or incontinence Skin: Denies abnormal skin rashes Neurological: Per HPI Musculoskeletal: Denies joint pain, back or neck discomfort. No decrease in ROM Behavioral/Psych: Denies anxiety, disturbance in thought content, and mood instability   Physical Exam: Vitals:   06/11/17 1145  BP: (!) 131/100  Pulse: 81  Resp: 20  Temp: 97.7 F (36.5 C)  SpO2: 97%   KPS: 70. General: Alert, cooperative, pleasant, in no acute distress Head: Craniotomy scar noted, dry and intact. EENT: No conjunctival injection or scleral icterus. Oral mucosa moist Lungs: Resp effort normal Cardiac: Regular rate and rhythm Abdomen: Soft, non-distended abdomen Skin: No rashes cyanosis or petechiae. Extremities: No clubbing or edema  Neurologic Exam: Mental Status: Awake, alert, attentive to examiner. Oriented to self and environment. Language is fluent with intact comprehension.  Age advanced psychomotor slowing.  Limited insight at times.  Cranial Nerves: Visual acuity is grossly normal. Visual fields are full. Extra-ocular movements intact. No ptosis. Face  is symmetric, tongue midline. Motor: Tone and bulk are normal. Power is impaired to 4+/5 right leg, flexors>extendors. Reflexes are symmetric, no pathologic reflexes present. Intact finger to nose bilaterally Sensory: Intact to light touch and temperature Gait: Mildly hemiparetic   Labs: I have reviewed the data as listed    Component Value Date/Time   NA 140 06/04/2017 0911   NA 140 03/26/2017 0928   K 4.5 06/04/2017 0911   K 4.7 03/26/2017 0928   CL 109 06/04/2017 0911   CO2 21 (L) 06/04/2017 0911   CO2 21 (L) 03/26/2017 0928   GLUCOSE 175 (H) 06/04/2017 0911   GLUCOSE 95 03/26/2017 0928   BUN 32 (H) 06/04/2017 0911   BUN 19.3 03/26/2017 0928   CREATININE 1.37 (H) 06/04/2017 0911   CREATININE 1.2 03/26/2017 0928   CALCIUM 9.2 06/04/2017 0911   CALCIUM 8.7 03/26/2017 0928   PROT 6.6 06/04/2017 0911   PROT 6.8 03/26/2017 0928   ALBUMIN 3.6 06/04/2017 0911   ALBUMIN 3.7 03/26/2017 0928   AST 20 06/04/2017 0911   AST 22 03/26/2017 0928   ALT 18 06/04/2017 0911   ALT 15 03/26/2017 0928   ALKPHOS 50 06/04/2017 0911   ALKPHOS 58 03/26/2017 0928   BILITOT 0.5 06/04/2017 0911   BILITOT 0.58 03/26/2017 0928   GFRNONAA 48 (L) 06/04/2017 0911   GFRAA 56 (L) 06/04/2017 0911   Lab Results  Component Value Date   WBC 4.7 06/04/2017   NEUTROABS 3.6 06/04/2017   HGB 16.2 06/04/2017   HCT 48.6 06/04/2017   MCV 90.9 06/04/2017   PLT 151 06/04/2017     Imaging:  [facility] P[facility] Clinician Interpretation: I have personally reviewed  the CNS images as listed.  My interpretation, in the context of the patient's clinical presentation, is stable disease  Dg Chest 2 View  Result Date: 05/24/2017 CLINICAL DATA:  Shortness of Breath EXAM: CHEST  2 VIEW COMPARISON:  10/15/2015 FINDINGS: Left pacer remains in place, unchanged. Prior CABG. Linear scarring or atelectasis at the left base. Right lung clear. Heart is normal size. No effusions or acute bony abnormality. IMPRESSION: No active  cardiopulmonary disease. Electronically Signed   By: Rolm Baptise M.D.   On: 05/24/2017 11:34   Ct Head W Wo Contrast  Result Date: 06/08/2017 CLINICAL DATA:  77 year old male with melanoma metastases to the brain. Pacemaker, unable to have MRI. Left frontal metastasis treated with SRS and left frontal craniotomy and resection in 2017. Small right cerebellar metastasis treated with SRS in late 2017. Restaging. EXAM: CT HEAD WITHOUT AND WITH CONTRAST TECHNIQUE: Contiguous axial images were obtained from the base of the skull through the vertex without and with intravenous contrast CONTRAST:  56m ISOVUE-300 IOPAMIDOL (ISOVUE-300) INJECTION 61% COMPARISON:  03/08/2017 and earlier. FINDINGS: Brain: Stable postoperative changes with volume loss in the anterior left frontal lobe lobe and ex vacuo enlargement of the left lateral ventricle, especially the frontal horn. Patchy hypodensity about the left frontal horn is stable with no mass effect. Following contrast, mild subependymal curvilinear enhancement about the frontal horn appears decreased compared to 11/27/2016. Small hyperdense round 6 millimeter right cerebellar lesion demonstrates stable surrounding hypodensity with no definite regional mass effect. Following contrast there is continued pronounced enhancement of the lesion, but lesion size remains stable at 6-7 millimeters diameter. Stable gray-white matter differentiation elsewhere. Stable ventricle size and configuration. No intracranial mass effect. No acute intracranial hemorrhage identified. No other abnormal intracranial enhancement identified. Vascular: Mild Calcified atherosclerosis at the skull base. The major intracranial vascular structures are enhancing as expected. Skull: Stable previous left frontotemporal craniotomy. No acute osseous abnormality identified. Sinuses/Orbits: Visualized paranasal sinuses and mastoids are stable and well pneumatized. Other: Stable orbit and scalp soft tissues.  IMPRESSION: 1. Enhancement of the treated small right cerebellar lesion remains increased compared to studies prior to November 2018, but size is stable at 6-7 mm and surrounding hypodensity is stable. 2. Stable post treatment appearance of the left anterior frontal lobe. Curvilinear enhancement about the left frontal horn appears regressed since August 2018. 3. No new metastatic disease or new intracranial abnormality identified. Electronically Signed   By: HGenevie AnnM.D.   On: 06/08/2017 14:32      Assessment/Plan 1. Brain metastases ([place]  2. Cognitive Impairment  Mr. HGirgisis clinically and radiographically stable today.    At this time we recommend returning to clinic in 4 months with a repeat CT head with and without contrast for evaluation.    The case at that time will be reviewed again in multidisciplinary brain tumor board.   He will continue to follow with Dr. FBurr Medicofor his systemic therapy, currently Nivolumab q4 weeks.     We appreciate the opportunity to participate in the care of CDarriel Utter.   All questions were answered. The patient knows to call the clinic with any problems, questions or concerns. No barriers to learning were detected.  The total time spent in the encounter was 25 minutes and more than 50% was on counseling and review of test results   ZVentura Sellers MD Medical Director of Neuro-Oncology [facility] [place]03/04/19 11:46 AM

## 2017-06-11 NOTE — Telephone Encounter (Signed)
Scheduled appt per 3/4 los - Gave patient AVS and calender per los.

## 2017-06-11 NOTE — Telephone Encounter (Signed)
Left message for pt to call back.  Need to relay message and instructions per Dr. Darnell Level.

## 2017-06-12 DIAGNOSIS — L308 Other specified dermatitis: Secondary | ICD-10-CM | POA: Diagnosis not present

## 2017-06-12 DIAGNOSIS — L853 Xerosis cutis: Secondary | ICD-10-CM | POA: Diagnosis not present

## 2017-06-12 DIAGNOSIS — L821 Other seborrheic keratosis: Secondary | ICD-10-CM | POA: Diagnosis not present

## 2017-06-12 DIAGNOSIS — L738 Other specified follicular disorders: Secondary | ICD-10-CM | POA: Diagnosis not present

## 2017-06-12 DIAGNOSIS — L918 Other hypertrophic disorders of the skin: Secondary | ICD-10-CM | POA: Diagnosis not present

## 2017-06-12 DIAGNOSIS — L218 Other seborrheic dermatitis: Secondary | ICD-10-CM | POA: Diagnosis not present

## 2017-06-12 DIAGNOSIS — D1801 Hemangioma of skin and subcutaneous tissue: Secondary | ICD-10-CM | POA: Diagnosis not present

## 2017-06-12 DIAGNOSIS — L570 Actinic keratosis: Secondary | ICD-10-CM | POA: Diagnosis not present

## 2017-06-12 DIAGNOSIS — Z85820 Personal history of malignant melanoma of skin: Secondary | ICD-10-CM | POA: Diagnosis not present

## 2017-06-12 DIAGNOSIS — Z85828 Personal history of other malignant neoplasm of skin: Secondary | ICD-10-CM | POA: Diagnosis not present

## 2017-06-12 NOTE — Telephone Encounter (Signed)
Spoke with pt relaying message and instructions per Dr. Darnell Level.  Pt verbalizes understanding.

## 2017-06-16 NOTE — Progress Notes (Signed)
[place]  [phone] Fax:([phone]  Clinic Follow up Note   Patient Care Team: [name], MD as PCP - General (Family Medicine)   Date of Service:  06/18/2017  Chief complaint: Follow-up metastatic melanoma   Oncology History   Metastatic melanoma [facility])   Staging form: Melanoma of the Skin, AJCC 7th Edition     Clinical stage from 09/21/2015: Stage IV [place], [place], [place]) - Signed by [name], MD on 10/09/2015       Malignant melanoma ([place])   09/16/2015 Imaging    PET scan showed a hypermetabolic 2.5KY lingular nodule, and a 46m right upper lobe lung nodule, left subclavicular nodes, and bone metastasis in the proximal right humerus and right femur.       09/21/2015 Initial Diagnosis    Metastatic melanoma ([place]      09/21/2015 Initial Biopsy    Left subclavicular lymph node biopsy showed prostatic of melanoma      09/21/2015 Miscellaneous    BRAF V600K mutation (+)       10/04/2015 Imaging    CT head with without contrast showed a 2.0 x 1.6 x 1.7 cm superficial left frontal hyperdense lesion, with postcontrast enhancement, lying within the previous hemorrhagic area, concerning for solitary melanoma metastasis      10/06/2015 - 09/06/2016 Chemotherapy    Nivolumab '240mg'$  every 2 weeks, held due to his dexamethasone for radionecrosis       10/13/2015 - 10/13/2015 Radiation Therapy    10/13/2015 Preop SRS treatment: 14 Gy  to the left frontal lesion in 1 fraction      10/15/2015 Surgery    left front craniotomy and tumor excision       10/15/2015 Pathology Results    left frontal brain mass excision showed metastatic melanoma, 1.5X1.5X0.6cm      01/13/2016 Imaging    CT head without and with contrast showed ill-defined enhancement and dural thickening of the left frontal resection cavity may represent a post treatment changes or residual neoplasm. A new 6 mm hypodensity in the right cerebellar hemisphere with uncertainty enhancement may  represent a metastasis or small brain parenchymal hemorrhage.       01/27/2016 - 01/27/2016 Radiation Therapy    01/27/16 SRS Treatment:  20 Gy in 1 fraction to a 6 mm right cerebellar brain met       03/29/2016 PET scan    PET 03/29/2016 IMPRESSION: 1. Overall there has been an interval response to therapy. The index lesions sided on previous exam it are less hypermetabolic when compared with the previous exam. 2. There is an intramuscular lesion within the right masseter which has increased FDG uptake compared with previous exam. 3. No new sites of disease identified. 4. Aortic atherosclerosis and infrarenal abdominal aortic aneurysm. Recommend followup by ultrasound in 3 years. This recommendation follows ACR consensus guidelines: White Paper of the ACR Incidental Findings Committee II on Vascular Findings. JJoellyn RuedRadiol 2013; 10:789-794      04/20/2016 Imaging    CT Head w wo contrast 1. Mild patchy enhancement at the left anterior frontal lobe treatment site (series 11, image 39) without mass effect and stable surrounding white matter hypodensity without strong evidence of acute vasogenic edema. Recommend continued CT surveillance. 2. No new metastatic disease or new intracranial abnormality identified. 3. Probable benign 14 mm left parietal bone lucent lesion, unchanged since May 2017.      05/06/2016 - 05/08/2016 Hospital Admission    Pt was admitted for  rectal bleeding for one day. Breathing spontaneously resolved, hemoglobin dropped from 15 to 10.5, did not require blood transfusion. Colonoscopy showed no active bleeding, except diverticulosis and hemorrhoid. He was discharged to home.      05/08/2016 Procedure    Colonoscopy  - Stool in the ascending colon. Fluid aspiration performed. - Diverticulosis in the entire examined colon. - Non-bleeding internal hemorrhoids. - The examination was otherwise normal on direct and retroflexion views.      05/29/2016  Imaging    PET Image Restaging IMPRESSION: 1. Stable exam with no evidence progression. 2. Photopenia in the LEFT frontal lobe at prior surgical site. 3. Stable mildly hypermetabolic RIGHT paratracheal node. This may represent a thyroid nodule. 4. Stable size of minimally metabolic lingular pulmonary nodule. 5. Stable skeletal lesion of the RIGHT iliac crest 6. No cutaneous metabolic lesion.      07/20/2016 Imaging    CT Head W WO Contrast IMPRESSION: Pronounced change in the left frontal region. Much more regional vasogenic edema. Relatively stable appearance of the abnormal enhancement extending from the lateral margin of the frontal horn of left lateral ventricle to the frontal brain surface. New region of abnormal enhancement surrounding the frontal horn of the left lateral ventricle measuring approximately 3.2 cm. The differential diagnosis is recurrent tumor versus radiation necrosis. The patient is reportedly not an MRI candidate. This area is large enough that CT perfusion could possibly make a reasonable differentiation.      09/02/2016 Imaging    CT Head 09/02/16 IMPRESSION: Somewhat mixed pattern of slight posterior progression of pleomorphic enhancement surrounding the LEFT lateral ventricle although no significant mass effect, and reduced vasogenic edema compared with the scan in April.      09/27/2016 - 11/02/2016 Chemotherapy    Start dabrafenib and trametinib 09/27/16, stopped due to severe fatigue        10/05/2016 Imaging    CT Head w & w/o contrast  IMPRESSION: 1. Decreased size of the enhancing area within the left frontal lobe treatment site. Surrounding edema has also decreased. The findings support continued evolution of radiation necrosis. 2. No new enhancing lesions.      10/18/2016 PET scan    IMPRESSION: 1. New focal hypermetabolism in the proximal sigmoid colon with associated focal colonic wall thickening and mild pericolonic fat stranding at  the site of a proximal sigmoid diverticulum, favored to represent mild acute sigmoid diverticulitis. Metastatic disease is considered less likely. Clinical correlation is necessary. Consider appropriate antibiotic therapy, as clinically warranted. Attention to this region recommended on future PET-CT follow-up. 2. Otherwise complete metabolic response with no evidence of hypermetabolic metastatic disease. Lingular pulmonary nodule is slightly decreased in size and demonstrates no significant metabolism. 3. No residual hypermetabolism in the right thyroid lobe nodule, which is stable in size. 4. Aortic Atherosclerosis (ICD10-I70.0). Stable 3.1 cm infrarenal abdominal aortic aneurysm. Aortic aneurysm NOS (ICD10-I71.9). Recommend followup by ultrasound in 3 years. This recommendation follows ACR consensus guidelines: White Paper of the ACR Incidental Findings Committee II on Vascular Findings. Joellyn Rued Radiol 2013; 29:518-841.      11/01/2016 - 11/04/2016 Hospital Admission    Patient admitted to the hospital due to hypercalcemia where he received IV fluids and pamidronate      12/13/2016 PET scan    PET 12/13/16 IMPRESSION: 1. No findings to suggest metastatic disease on today's examination. 2. Previously noted hypermetabolism in the sigmoid colon has resolved, presumably indicative of a focus of diverticulitis on the prior study. Today's study  does demonstrate relatively diffuse hypermetabolism throughout the cecum, ascending colon and proximal transverse colon where there is some mild mural thickening, favored to reflect mild chronic inflammation related to underlying diverticular disease. No definite inflammatory changes on the CT portion of the examination to strongly suggest an active acute diverticulitis at this time. No discrete lesion to suggest neoplasm. 3. Previously noted 8 mm lingular nodule is stable in size and known remains non hypermetabolic. This is nonspecific but  reassuring. Continued attention on future followup studies is recommended. 4. Aortic atherosclerosis, in addition to left main and 3 vessel coronary artery disease. Status post median sternotomy for CABG including LIMA to the LAD. In addition, there is mild fusiform aneurysmal dilatation of the infrarenal abdominal aorta (3.1 cm in diameter) as well as aneurysmal dilatation of the left common iliac      12/18/2016 -  Chemotherapy    Restarted nivolamab '240mg'$  every 2 weeks, with tapering dose dexamethasone        04/18/2017 Imaging    PET Scan IMPRESSION: 1. No hypermetabolic lesion to suggest active malignancy. 2. Stable 8 mm lingular nodule is not currently hypermetabolic but merit surveillance. Infrarenal abdominal aortic aneurysm 3.1 cm in diameter. Recommend followup by [place] in 3 years. 3. Other imaging findings of potential clinical significance: Postoperative findings in the left frontal lobe. Right renal cyst. Aortic Atherosclerosis (ICD10-I70.0). Osteoarthritis. Lumbar scoliosis. Left knee effusion. Colonic diverticulosis.        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 He was found to have left frontal hemorrhage 5.7 cm x 4.5 cm with 6 mm left to right shift.During the workup for his stroke, a left upper lung mass was noticed on the CT scan. I was called to evaluate his lung mass.  Patient has aphasia, not able to communicate much. According to his daughter, Dr. [name], who is a OB GYN at [employer], he probably only smoked for very short period time during the college. He did have 3 skin melanoma, which was removed, last one was 2-3 years ago. No other cancer history.  CURRENT THERAPY:  Restarted nivolamab on 12/18/2016 and changed to every 4 weeks on 01/30/2017, changed back to every 2 weeks due to fatigue on 04/23/17  INTERVAL HISTORY:   David [name]. returns for follow-up. He presents to the clinic today by himself. He states he is doing okay overall. He states he is having low energy and is having a hard time following asleep. He reports he is able to take care of himself and he is practicing some memory exercises. He has not been going out much an dhe is not able to play the piano for enjoyment anymore. He notes he feels mildly depressed but he is continuing the Zoloft. He also reports that his feet have been hurting and that is making it difficult to be active.    He states he has a new PCP in [place]. He has been following up with Dr. Mickeal Skinner every 3-4 months.   On review of systems, pt denies any other complaints at this time. Pertinent positives are listed and detailed within the above HPI.   REVIEW OF SYSTEMS:   Constitutional: Denies fevers, chills or abnormal weight loss (+) insomnia (+) fatigue  Eyes: Denies blurriness of vision Ears, nose, mouth, throat, and face: Denies mucositis or sore throat Respiratory: Denies cough, dyspnea or wheezes  Cardiovascular: Denies palpitation, chest discomfort or lower extremity swelling Gastrointestinal:  Denies nausea, heartburn, hematochezia  Skin: Negative. Lymphatics: Denies new lymphadenopathy or easy bruising Neurological: (+) dizziness MSK: (+) Arthritis pain  Behavioral/Psych: Mood is stable, no new changes (+) trouble processing short and long term memory All other systems were reviewed with the patient and are negative.  MEDICAL HISTORY:  Past Medical History:  Diagnosis Date  . Anginal pain ([place])   . Arthritis    mostly hands  . Benign familial hematuria   . BPH (benign prostatic hypertrophy)   . Brain cancer ([place])    melanoma with brain met  . Coronary artery disease   . GERD (gastroesophageal reflux disease)   . High cholesterol   . Hypertension   . Hypothyroidism   . Intracerebral hemorrhage ([place])   . Melanoma ([place])   . Metastatic melanoma to lung ([place])     . Mood disorder ([place])   . Myocardial infarction ([place])   . Pacemaker    due to syncope, 3rd degree HB (upgrade to [facility]. Jude CRT-P 02/25/13 (Dr. Uvaldo Rising)  . Rectal bleed    due to NSAIDS  . Renal disorder   . Skin cancer    melanoma  . Sleep apnea    uses CPAP  . Stroke [facility])     SURGICAL HISTORY: Past Surgical History:  Procedure Laterality Date  . APPLICATION OF CRANIAL NAVIGATION N/A 10/15/2015   Procedure: APPLICATION OF CRANIAL NAVIGATION;  Surgeon: Erline Levine, MD;  Location: [facility] NEURO ORS;  Service: Neurosurgery;  Laterality: N/A;  . CARDIAC CATHETERIZATION  2013  . CARDIAC SURGERY     bypass X 2  . COLONOSCOPY WITH PROPOFOL N/A 05/08/2016   Procedure: COLONOSCOPY WITH PROPOFOL;  Surgeon: Jonathon Bellows, MD;  Location: [facility] ENDOSCOPY;  Service: Gastroenterology;  Laterality: N/A;  . CORONARY ARTERY BYPASS GRAFT  2013   LIMA-LAD, SVG-PDA 03/27/11 (Dr. Francee Gentile)  . CRANIOTOMY N/A 10/15/2015   Procedure: LEFT FRONTAL CRANIOTOMY TUMOR EXCISION with Curve;  Surgeon: Erline Levine, MD;  Location: [facility] NEURO ORS;  Service: Neurosurgery;  Laterality: N/A;  CRANIOTOMY TUMOR EXCISION  . EYE SURGERY    . FOOT SURGERY  08/2010  . JOINT REPLACEMENT Left    partial knee  . KNEE ARTHROSCOPY  12/2008  . LYMPH NODE BIOPSY    . PACEMAKER INSERTION    . TONSILLECTOMY      I have reviewed the social history and family history with the patient and they are unchanged from previous note.  ALLERGIES:  is allergic to zocor [simvastatin].  MEDICATIONS:  Current Outpatient Medications  Medication Sig Dispense Refill  . acetaminophen (TYLENOL) 500 MG tablet Take 500 mg by mouth at bedtime.    Marland Kitchen amLODipine (NORVASC) 5 MG tablet Take 1 tablet (5 mg total) by mouth daily. 90 tablet 3  . aspirin EC 81 MG tablet Take 81 mg by mouth daily.    Marland Kitchen levothyroxine (SYNTHROID, LEVOTHROID) 137 MCG tablet Take 1 tablet (137 mcg total) by mouth daily before breakfast. 30 tablet 3  . metoprolol succinate (TOPROL-XL) 50 MG  24 hr tablet Take 1.5 tablets daily ('75mg'$ ). Take with or immediately following a meal. 135 tablet 3  . pentoxifylline (TRENTAL) 400 MG CR tablet Take 1 tablet (400 mg total) by mouth 2 (two) times daily. 60 tablet 6  . rosuvastatin (CRESTOR) 10 MG tablet Take 10 mg by mouth at bedtime.    . sertraline (ZOLOFT) 100 MG tablet     . testosterone cypionate (  DEPOTESTOTERONE CYPIONATE) 100 MG/ML injection Inject 100 mg into the muscle every 14 (fourteen) days.     . Turmeric 500 MG CAPS Take 2 capsules by mouth daily.    . vitamin E (VITAMIN E) 400 UNIT capsule Take 1 capsule (400 Units total) by mouth 2 (two) times daily. 60 capsule 6   No current facility-administered medications for this visit.    Facility-Administered Medications Ordered in Other Visits  Medication Dose Route Frequency Provider Last Rate Last Dose  . 0.9 %  sodium chloride infusion  1,000 mL Intravenous Once [name], MD        PHYSICAL EXAMINATION:  ECOG PERFORMANCE STATUS: 2 BP (!) 148/92 (BP Location: Left Arm, Patient Position: Sitting) Comment: [place] RN is aware  Pulse 76   Temp 97.9 F (36.6 C) (Oral)   Resp 20   Ht '5\' 8"'$  (1.727 m)   Wt 202 lb 12.8 oz (92 kg)   SpO2 95%   BMI 30.84 kg/m   GENERAL:alert, no distress and comfortable SKIN: Significant skin thickening below his right big toe with mild ecchymosis. Other skin color, texture, turgor are normal.  EYES: normal, Conjunctiva are pink and non-injected, sclera clear OROPHARYNX:no exudate, no erythema and lips, buccal mucosa, and tongue normal  NECK: supple, thyroid normal size, non-tender, without nodularity LYMPH:  no palpable lymphadenopathy in the cervical, [place], axillary or inguinal LUNGS: clear to auscultation and percussion with normal breathing effort HEART: regular rate & rhythm and no murmurs and no lower extremity edema ABDOMEN:abdomen soft, non-tender and normal bowel sounds Musculoskeletal:no cyanosis of digits and no clubbing    LABORATORY  DATA:  I have reviewed the data as listed CBC Latest Ref Rng & Units 06/18/2017 06/04/2017 05/21/2017  WBC 4.0 - 10.3 K/uL 5.6 4.7 4.7  Hemoglobin 13.0 - 17.1 g/dL 17.7(H) 16.2 16.0  Hematocrit 38.4 - 49.9 % 52.1(H) 48.6 47.6  Platelets 140 - 400 K/uL 149 151 149     CMP Latest Ref Rng & Units 06/18/2017 06/04/2017 05/21/2017  Glucose 70 - 140 mg/dL 103 175(H) 137  BUN 7 - 26 mg/dL 36(H) 32(H) 36(H)  Creatinine 0.70 - 1.30 mg/dL 1.63(H) 1.37(H) 1.40(H)  Sodium 136 - 145 mmol/L 141 140 141  Potassium 3.5 - 5.1 mmol/L 5.1 4.5 4.7  Chloride 98 - 109 mmol/L 107 109 110(H)  CO2 22 - 29 mmol/L 25 21(L) 22  Calcium 8.4 - 10.4 mg/dL 10.6(H) 9.2 9.5  Total Protein 6.4 - 8.3 g/dL 7.3 6.6 6.8  Total Bilirubin 0.2 - 1.2 mg/dL 0.5 0.5 0.5  Alkaline Phos 40 - 150 U/L 57 50 57  AST 5 - 34 U/L '23 20 21  '$ ALT 0 - 55 U/L '21 18 16     '$ PATHOLOGY REPORT: Diagnosis 09/21/2015 Lymph node, needle/core biopsy, hypermetabolic left sub clavicular LN - METASTATIC MALIGNANT MELANOMA. - SEE COMMENT.  Diagnosis 10/15/2015 Brain, for tumor resection, Left frontal - METASTATIC MELANOMA.     PROCEDURES: Colonoscopy 05/08/16 - Stool in the ascending colon. Fluid aspiration performed. - Diverticulosis in the entire examined colon. - Non-bleeding internal hemorrhoids. - The examination was otherwise normal on direct and retroflexion views.  RADIOGRAPHIC STUDIES: I have personally reviewed the radiological images as listed and agreed with the findings in the report. No results found.   CT Head with and without contrast 11/27/2016 IMPRESSION: 1. Continued evolution of post radiation changes in the left frontal lobe with further decrease in the area of contrast enhancement and adjacent edema. 2. No new  contrast-enhancing lesions.  PET 10/18/16 IMPRESSION: 1. New focal hypermetabolism in the proximal sigmoid colon with associated focal colonic wall thickening and mild pericolonic fat stranding at the site of  a proximal sigmoid diverticulum, favored to represent mild acute sigmoid diverticulitis. Metastatic disease is considered less likely. Clinical correlation is necessary. Consider appropriate antibiotic therapy, as clinically warranted. Attention to this region recommended on future PET-CT follow-up. 2. Otherwise complete metabolic response with no evidence of hypermetabolic metastatic disease. Lingular pulmonary nodule is slightly decreased in size and demonstrates no significant metabolism. 3. No residual hypermetabolism in the right thyroid lobe nodule, which is stable in size. 4. Aortic Atherosclerosis (ICD10-I70.0). Stable 3.1 cm infrarenal abdominal aortic aneurysm. Aortic aneurysm NOS (ICD10-I71.9). Recommend followup by ultrasound in 3 years. This recommendation follows ACR consensus guidelines: White Paper of the ACR Incidental Findings Committee II on Vascular Findings. J Am Coll Radiol 2013;   CT Head W and WO Contrast 10/05/16 IMPRESSION: 1. Decreased size of the enhancing area within the left frontal lobe treatment site. Surrounding edema has also decreased. The findings support continued evolution of radiation necrosis. 2. No new enhancing lesions.  CT Head 09/02/16 IMPRESSION: Somewhat mixed pattern of slight posterior progression of pleomorphic enhancement surrounding the LEFT lateral ventricle although no significant mass effect, and reduced vasogenic edema compared with the scan in April.  CT Head W WO Contrast 07-27-2016 IMPRESSION: Pronounced change in the left frontal region. Much more regional vasogenic edema. Relatively stable appearance of the abnormal enhancement extending from the lateral margin of the frontal horn of left lateral ventricle to the frontal brain surface. New region of abnormal enhancement surrounding the frontal horn of the left lateral ventricle measuring approximately 3.2 cm. The differential diagnosis is recurrent tumor versus radiation  necrosis. The patient is reportedly not an MRI candidate. This area is large enough that CT perfusion could possibly make a reasonable differentiation.  PET Image Restage 05/29/16 IMPRESSION: 1. Stable exam with no evidence progression. 2. Photopenia in the LEFT frontal lobe at prior surgical site. 3. Stable mildly hypermetabolic RIGHT paratracheal node. This may represent a thyroid nodule. 4. Stable size of minimally metabolic lingular pulmonary nodule. 5. Stable skeletal lesion of the RIGHT iliac crest 6. No cutaneous metabolic lesion.   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   1. Metastatic melanoma to lung, node, bone and brain, stage IV, BRAF V600K mutation (+) -I previously  reviewed his PET/CT scan images with pt and his family members. The scan revealed hypermetabolic left upper lobe lung mass, 2 small right lung nodules, probable bone metastasis, and a subcutaneous left supraclavicular lesion, which is not palpable  -His repeat CT had showed a 2.0 cm left frontal hyperdense lesion with enhancement on contrast, in the area of his recent hemorrhagic stroke, highly suspicious for brain metastasis.  -We previously discussed his left supraclavicular node biopsy results, which showed metastatic melanoma.. -His case was discussed in our sarcoma tumor board. -I previously recommended a first-line treatment with Nivolamab alone, given his recent stroke, low tumor burden. I'll reserve BRAF inhibitor with or without MEK inhibitor as a second line therapy if he progress on immunotherapy. Other options of immunotherapy, especially checkpoint inhibitor with ipilumimab, were also discussed with pt and his daughter -He is on first line therapy with Nivolumab, has been tolerating Nivolumab well, no significant side effects except fatigue. It was held from  09/06/16 to 12/18/2016 due to his chronic steroids use -He has had near complete metabolic response on the recent restaging PET scan in July 2018. -We'll continue Delton See monthly for his bone metastasis. -He has recently developed neurological symptoms secondary to his radionecrosis in May 2018, on tapering dose of dexa  -He tried dabrafenib and trametinib when he was off Nivo but tolerated poorly, stopped on 11/01/2016. -PET can 12/13/16 shows no evidence of disease. He has achieved a radiographic complete response.  -He has restarted Nivolamab on 12/18/16 and changed to every 4 weeks on 01/30/17. I plan to continue Nivolumab for total of 2 years if he remains to be NED on scan -He finish tapering steroids on 10/32/18, he has tolerated this well.  -I previously encouraged him to be active with walking or the gym since he has completed physical therapy, his daughter has been encouraging to use his pool in his facility.  -I discussed his residual muscle atrophy from previous steroid use can be helped with PT and being active with exercise, which I encourage him to do  -PET scan on 04/18/2017 again showed complete radiographic metabolic response with only a residual 17m left lung nodule without any FDG uptake. Discussed results with pt and he is pleased  -He is clinically doing well, no symptoms, except memory loss and cognitive dysfunction, able to do self-care at home. -Labs reviewed today (06/18/17) , adequate for continuing with Nivolumab today.  Due to his fatigue after treatment, will continue his Nivolumab every 2 weeks  -If he remains to be in complete response by September 2019, I will stop his nivolumab after a total of 2 years treatment. -f/u in 4 weeks  2. History of hemorrhagic stroke and brain metastasis  -I encouraged him to continue physical therapy and occupational therapy. He has recovered very well -History post SRS and surgical resection of his solitary brain metastasis  -His neurology  deficit has much improved -He recently received a course of dexamethasone for radionecrosis -He went to ED for slurred speech and vomiting and a Head CT was taken 09/02/16 which showed radionecrosis -His last CT was 10/05/16 which showed some improvement. However his clinical neurological function has deteriorated -I have previously discussed with our neuro-oncologist Dr. 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 VMickeal Skinner his radionecrosis has improved, no other new lesions. He was also seen by Dr. VMickeal Skinnerwho feels he is recovering well from neurological standpoint -I tapered his steroids until he stopped altogether by 01/29/17 -He will f/u with Dr. VMickeal Skinner  3. 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 VMickeal Skinnerto discuss treatment options to improve his neurologic function, patient is very frustrated about this, which significantly impacting his quality of life. -continue PT/OT  -CT Head 11/27/2016 showed continued evolution of post radiation changes in the left frontal lobe with further decrease in the area of contrast enhancement and adjacent edema. No new contrast-enhancing lesions.  -The patient previously reviewed these results with neuro-oncologist, Dr. VMickeal Skinner   4.  Hypercalcemia and CKD stage III -he was found to have calcium 13.4 and Cr 2.0 on his  office visit 11/01/2016 -Possibly related to his underlying malignancy (bone mets) and missing one dose of Xgeva  -He has restarted Xgeva monthly on 11/10/16. Calcium has been normal lately  -Cr also improved, it has been slightly elevated, EGFR in  50s -his Ca is 10.6 today (06/18/17), he will receive Xgeva today. I advised him to eat less cheese and cut out milk and dairy products as possible --I previously encouraged him to drink water adequately -avoid NSAIDs  5. Fatigue, hypothyroidism and hypotestosteronemia -He has baseline hypothyroidism and hypotestosteronemia -His fatigue has much improved since he restarted testosterone injection by his primary care physician -His TSH level has been slightly fluctuating, possibly partially related to Nivolumab -He will follow-up with his primary care physician for his Synthroid dose adjustment    6. HTN  -he is on amlodipine and carvedilol, follow-up of his primary care physician -Due to drug interaction with new oral chemo he will need to monitor his BP more often.  -Systolic BP has been high, 148/101 today (03/26/17). He is currently on low dose Coreg. He has been dizzy lately. I will send note to Dr. Asher Muir about medication. -163/116 today, pt will follow up with PCP and maybe add another form of BP medication. No chest pain or headaches. Will repeat in infusion room.  -I advised him to continue to keep a BP log at home   7. Depression  -he is on Zoloft '50mg'$  daily, management by his PCP  -improved overall   8. Goal of care discussion  -We previously discussed the incurable nature of his metastatic melanoma, and the overall poor prognosis, especially if he does not have good response to chemotherapy or progress on chemo -The patient understands the goal of care is palliative. -I previously recommended DNR/DNI, he will think about it   9. CAD -history of 2 double vessel bypass -He has pace maker -per pt (her daughter) request, he has switched his cardiologist to Dr. Haroldine Laws   10. [place] of the right foot  -He was referred to podiatry for further workup and management of this.  -He was seen by Podiatrist and symptoms have improved.   11. Insomnia -I previously  recommended he try OTC melatonin to help him sleep. I reviewed sleep hygiene with him.   Plan -Labs reviewed, will proceed Nivolumab today and continue every 2 weeks -Xgeva injection today and continue every 4 weeks  -Decrease/cut our dairy products -F/u in 4 weeks  -Send a message to his daughter to update her  All questions were answered. The patient knows to call the clinic with any problems, questions or concerns. No barriers to learning was detected.  I spent 20 minutes counseling the patient face to face. The total time spent in the appointment was 25 minutes and more than 50% was on counseling and review of test results.  This document serves as a record of services personally performed by [name], MD. It was created on her behalf by Theresia Bough, a trained medical scribe. The creation of this record is based on the scribe's personal observations and the provider's statements to them.   I have reviewed the above documentation for accuracy and completeness, and I agree with the above.   [name], MD 06/18/17 4:54 PM

## 2017-06-18 ENCOUNTER — Inpatient Hospital Stay: Payer: Medicare Other

## 2017-06-18 ENCOUNTER — Inpatient Hospital Stay (HOSPITAL_BASED_OUTPATIENT_CLINIC_OR_DEPARTMENT_OTHER): Payer: Medicare Other | Admitting: Hematology

## 2017-06-18 ENCOUNTER — Other Ambulatory Visit: Payer: Medicare Other

## 2017-06-18 ENCOUNTER — Encounter: Payer: Self-pay | Admitting: Hematology

## 2017-06-18 VITALS — BP 148/92 | HR 76 | Temp 97.9°F | Resp 20 | Ht 68.0 in | Wt 202.8 lb

## 2017-06-18 DIAGNOSIS — Z5112 Encounter for antineoplastic immunotherapy: Secondary | ICD-10-CM | POA: Diagnosis not present

## 2017-06-18 DIAGNOSIS — C7951 Secondary malignant neoplasm of bone: Secondary | ICD-10-CM

## 2017-06-18 DIAGNOSIS — E039 Hypothyroidism, unspecified: Secondary | ICD-10-CM

## 2017-06-18 DIAGNOSIS — C439 Malignant melanoma of skin, unspecified: Secondary | ICD-10-CM

## 2017-06-18 DIAGNOSIS — E291 Testicular hypofunction: Secondary | ICD-10-CM | POA: Diagnosis not present

## 2017-06-18 DIAGNOSIS — N183 Chronic kidney disease, stage 3 (moderate): Secondary | ICD-10-CM | POA: Diagnosis not present

## 2017-06-18 DIAGNOSIS — M6281 Muscle weakness (generalized): Secondary | ICD-10-CM | POA: Diagnosis not present

## 2017-06-18 DIAGNOSIS — I619 Nontraumatic intracerebral hemorrhage, unspecified: Secondary | ICD-10-CM

## 2017-06-18 DIAGNOSIS — C770 Secondary and unspecified malignant neoplasm of lymph nodes of head, face and neck: Secondary | ICD-10-CM

## 2017-06-18 DIAGNOSIS — F329 Major depressive disorder, single episode, unspecified: Secondary | ICD-10-CM | POA: Diagnosis not present

## 2017-06-18 DIAGNOSIS — C7931 Secondary malignant neoplasm of brain: Secondary | ICD-10-CM

## 2017-06-18 DIAGNOSIS — I129 Hypertensive chronic kidney disease with stage 1 through stage 4 chronic kidney disease, or unspecified chronic kidney disease: Secondary | ICD-10-CM

## 2017-06-18 DIAGNOSIS — R413 Other amnesia: Secondary | ICD-10-CM | POA: Diagnosis not present

## 2017-06-18 DIAGNOSIS — C799 Secondary malignant neoplasm of unspecified site: Secondary | ICD-10-CM

## 2017-06-18 DIAGNOSIS — I10 Essential (primary) hypertension: Secondary | ICD-10-CM

## 2017-06-18 DIAGNOSIS — G4700 Insomnia, unspecified: Secondary | ICD-10-CM

## 2017-06-18 DIAGNOSIS — I25810 Atherosclerosis of coronary artery bypass graft(s) without angina pectoris: Secondary | ICD-10-CM

## 2017-06-18 DIAGNOSIS — Z8673 Personal history of transient ischemic attack (TIA), and cerebral infarction without residual deficits: Secondary | ICD-10-CM

## 2017-06-18 DIAGNOSIS — L84 Corns and callosities: Secondary | ICD-10-CM

## 2017-06-18 LAB — CBC WITH DIFFERENTIAL/PLATELET
BASOS PCT: 1 %
Basophils Absolute: 0 10*3/uL (ref 0.0–0.1)
Eosinophils Absolute: 0.3 10*3/uL (ref 0.0–0.5)
Eosinophils Relative: 6 %
HEMATOCRIT: 52.1 % — AB (ref 38.4–49.9)
HEMOGLOBIN: 17.7 g/dL — AB (ref 13.0–17.1)
LYMPHS ABS: 0.6 10*3/uL — AB (ref 0.9–3.3)
Lymphocytes Relative: 10 %
MCH: 31 pg (ref 27.2–33.4)
MCHC: 34 g/dL (ref 32.0–36.0)
MCV: 91.2 fL (ref 79.3–98.0)
MONO ABS: 0.7 10*3/uL (ref 0.1–0.9)
MONOS PCT: 12 %
NEUTROS PCT: 71 %
Neutro Abs: 4.1 10*3/uL (ref 1.5–6.5)
Platelets: 149 10*3/uL (ref 140–400)
RBC: 5.71 MIL/uL (ref 4.20–5.82)
RDW: 14 % (ref 11.0–14.6)
WBC: 5.6 10*3/uL (ref 4.0–10.3)

## 2017-06-18 LAB — COMPREHENSIVE METABOLIC PANEL
ALBUMIN: 3.8 g/dL (ref 3.5–5.0)
ALT: 21 U/L (ref 0–55)
ANION GAP: 9 (ref 3–11)
AST: 23 U/L (ref 5–34)
Alkaline Phosphatase: 57 U/L (ref 40–150)
BUN: 36 mg/dL — ABNORMAL HIGH (ref 7–26)
CALCIUM: 10.6 mg/dL — AB (ref 8.4–10.4)
CO2: 25 mmol/L (ref 22–29)
Chloride: 107 mmol/L (ref 98–109)
Creatinine, Ser: 1.63 mg/dL — ABNORMAL HIGH (ref 0.70–1.30)
GFR calc non Af Amer: 39 mL/min — ABNORMAL LOW (ref >60)
GFR, EST AFRICAN AMERICAN: 45 mL/min — AB (ref >60)
GLUCOSE: 103 mg/dL (ref 70–140)
Potassium: 5.1 mmol/L (ref 3.5–5.1)
SODIUM: 141 mmol/L (ref 136–145)
Total Bilirubin: 0.5 mg/dL (ref 0.2–1.2)
Total Protein: 7.3 g/dL (ref 6.4–8.3)

## 2017-06-18 LAB — TSH: TSH: 8.704 u[IU]/mL — AB (ref 0.320–4.118)

## 2017-06-18 MED ORDER — DENOSUMAB 120 MG/1.7ML ~~LOC~~ SOLN
120.0000 mg | Freq: Once | SUBCUTANEOUS | Status: AC
  Administered 2017-06-18: 120 mg via SUBCUTANEOUS

## 2017-06-18 MED ORDER — SODIUM CHLORIDE 0.9 % IV SOLN
Freq: Once | INTRAVENOUS | Status: AC
  Administered 2017-06-18: 12:00:00 via INTRAVENOUS

## 2017-06-18 MED ORDER — SODIUM CHLORIDE 0.9 % IV SOLN
240.0000 mg | Freq: Once | INTRAVENOUS | Status: AC
  Administered 2017-06-18: 240 mg via INTRAVENOUS
  Filled 2017-06-18: qty 24

## 2017-06-18 MED ORDER — DENOSUMAB 120 MG/1.7ML ~~LOC~~ SOLN
SUBCUTANEOUS | Status: AC
  Filled 2017-06-18: qty 1.7

## 2017-06-18 NOTE — Patient Instructions (Signed)
[facility] Discharge Instructions for Patients Receiving Chemotherapy  Today you received the following chemotherapy agent:  Nivolumab (Opdivo)  To help prevent nausea and vomiting after your treatment, we encourage you to take your nausea medication as prescribed.   If you develop nausea and vomiting that is not controlled by your nausea medication, call the clinic.   BELOW ARE SYMPTOMS THAT SHOULD BE REPORTED IMMEDIATELY:  *FEVER GREATER THAN 100.5 F  *CHILLS WITH OR WITHOUT FEVER  NAUSEA AND VOMITING THAT IS NOT CONTROLLED WITH YOUR NAUSEA MEDICATION  *UNUSUAL SHORTNESS OF BREATH  *UNUSUAL BRUISING OR BLEEDING  TENDERNESS IN MOUTH AND THROAT WITH OR WITHOUT PRESENCE OF ULCERS  *URINARY PROBLEMS  *BOWEL PROBLEMS  UNUSUAL RASH Items with * indicate a potential emergency and should be followed up as soon as possible.  Feel free to call the clinic should you have any questions or concerns. The clinic phone number is [phone].  Please show the [place] at check-in to the Emergency Department and triage nurse.

## 2017-06-18 NOTE — Progress Notes (Signed)
OK to treat today despite elevated creat per [name].

## 2017-06-26 ENCOUNTER — Ambulatory Visit: Payer: Medicare Other | Attending: Internal Medicine

## 2017-06-26 DIAGNOSIS — N183 Chronic kidney disease, stage 3 (moderate): Secondary | ICD-10-CM | POA: Insufficient documentation

## 2017-06-26 DIAGNOSIS — G4733 Obstructive sleep apnea (adult) (pediatric): Secondary | ICD-10-CM | POA: Insufficient documentation

## 2017-06-26 DIAGNOSIS — R918 Other nonspecific abnormal finding of lung field: Secondary | ICD-10-CM | POA: Insufficient documentation

## 2017-06-26 DIAGNOSIS — G4761 Periodic limb movement disorder: Secondary | ICD-10-CM | POA: Insufficient documentation

## 2017-06-26 DIAGNOSIS — I2510 Atherosclerotic heart disease of native coronary artery without angina pectoris: Secondary | ICD-10-CM | POA: Insufficient documentation

## 2017-07-01 ENCOUNTER — Encounter: Payer: Self-pay | Admitting: Family Medicine

## 2017-07-02 ENCOUNTER — Inpatient Hospital Stay: Payer: Medicare Other

## 2017-07-02 ENCOUNTER — Other Ambulatory Visit: Payer: Medicare Other

## 2017-07-02 VITALS — BP 131/89 | HR 78 | Temp 98.7°F | Resp 17

## 2017-07-02 DIAGNOSIS — C7931 Secondary malignant neoplasm of brain: Secondary | ICD-10-CM | POA: Diagnosis not present

## 2017-07-02 DIAGNOSIS — Z5112 Encounter for antineoplastic immunotherapy: Secondary | ICD-10-CM | POA: Diagnosis not present

## 2017-07-02 DIAGNOSIS — I10 Essential (primary) hypertension: Secondary | ICD-10-CM

## 2017-07-02 DIAGNOSIS — C770 Secondary and unspecified malignant neoplasm of lymph nodes of head, face and neck: Secondary | ICD-10-CM | POA: Diagnosis not present

## 2017-07-02 DIAGNOSIS — C799 Secondary malignant neoplasm of unspecified site: Secondary | ICD-10-CM

## 2017-07-02 DIAGNOSIS — C439 Malignant melanoma of skin, unspecified: Secondary | ICD-10-CM | POA: Diagnosis not present

## 2017-07-02 DIAGNOSIS — M6281 Muscle weakness (generalized): Secondary | ICD-10-CM | POA: Diagnosis not present

## 2017-07-02 DIAGNOSIS — C7951 Secondary malignant neoplasm of bone: Secondary | ICD-10-CM | POA: Diagnosis not present

## 2017-07-02 DIAGNOSIS — I619 Nontraumatic intracerebral hemorrhage, unspecified: Secondary | ICD-10-CM

## 2017-07-02 LAB — CBC WITH DIFFERENTIAL/PLATELET
Basophils Absolute: 0 10*3/uL (ref 0.0–0.1)
Basophils Relative: 1 %
EOS ABS: 0.2 10*3/uL (ref 0.0–0.5)
Eosinophils Relative: 5 %
HEMATOCRIT: 48.7 % (ref 38.4–49.9)
HEMOGLOBIN: 16.3 g/dL (ref 13.0–17.1)
LYMPHS ABS: 0.4 10*3/uL — AB (ref 0.9–3.3)
Lymphocytes Relative: 8 %
MCH: 30.2 pg (ref 27.2–33.4)
MCHC: 33.4 g/dL (ref 32.0–36.0)
MCV: 90.3 fL (ref 79.3–98.0)
MONOS PCT: 10 %
Monocytes Absolute: 0.5 10*3/uL (ref 0.1–0.9)
NEUTROS PCT: 76 %
Neutro Abs: 4 10*3/uL (ref 1.5–6.5)
Platelets: 175 10*3/uL (ref 140–400)
RBC: 5.4 MIL/uL (ref 4.20–5.82)
RDW: 14.1 % (ref 11.0–14.6)
WBC: 5.1 10*3/uL (ref 4.0–10.3)

## 2017-07-02 LAB — COMPREHENSIVE METABOLIC PANEL
ALK PHOS: 56 U/L (ref 40–150)
ALT: 21 U/L (ref 0–55)
ANION GAP: 10 (ref 3–11)
AST: 23 U/L (ref 5–34)
Albumin: 3.6 g/dL (ref 3.5–5.0)
BILIRUBIN TOTAL: 0.4 mg/dL (ref 0.2–1.2)
BUN: 31 mg/dL — ABNORMAL HIGH (ref 7–26)
CALCIUM: 10 mg/dL (ref 8.4–10.4)
CO2: 22 mmol/L (ref 22–29)
Chloride: 110 mmol/L — ABNORMAL HIGH (ref 98–109)
Creatinine, Ser: 1.28 mg/dL (ref 0.70–1.30)
GFR calc non Af Amer: 52 mL/min — ABNORMAL LOW (ref >60)
Glucose, Bld: 113 mg/dL (ref 70–140)
Potassium: 4.6 mmol/L (ref 3.5–5.1)
SODIUM: 142 mmol/L (ref 136–145)
TOTAL PROTEIN: 6.7 g/dL (ref 6.4–8.3)

## 2017-07-02 LAB — TSH: TSH: 3.825 u[IU]/mL (ref 0.320–4.118)

## 2017-07-02 MED ORDER — SODIUM CHLORIDE 0.9 % IV SOLN
240.0000 mg | Freq: Once | INTRAVENOUS | Status: AC
  Administered 2017-07-02: 240 mg via INTRAVENOUS
  Filled 2017-07-02: qty 24

## 2017-07-02 MED ORDER — SODIUM CHLORIDE 0.9 % IV SOLN
Freq: Once | INTRAVENOUS | Status: AC
  Administered 2017-07-02: 10:00:00 via INTRAVENOUS

## 2017-07-02 NOTE — Patient Instructions (Signed)
[name] Discharge Instructions for Patients Receiving Chemotherapy  Today you received the following chemotherapy agents:  Nivolumab  To help prevent nausea and vomiting after your treatment, we encourage you to take your nausea medication as prescribed.   If you develop nausea and vomiting that is not controlled by your nausea medication, call the clinic.   BELOW ARE SYMPTOMS THAT SHOULD BE REPORTED IMMEDIATELY:  *FEVER GREATER THAN 100.5 F  *CHILLS WITH OR WITHOUT FEVER  NAUSEA AND VOMITING THAT IS NOT CONTROLLED WITH YOUR NAUSEA MEDICATION  *UNUSUAL SHORTNESS OF BREATH  *UNUSUAL BRUISING OR BLEEDING  TENDERNESS IN MOUTH AND THROAT WITH OR WITHOUT PRESENCE OF ULCERS  *URINARY PROBLEMS  *BOWEL PROBLEMS  UNUSUAL RASH Items with * indicate a potential emergency and should be followed up as soon as possible.  Feel free to call the clinic should you have any questions or concerns. The clinic phone number is [phone].  Please show the CHEMO ALERT CARD at check-in to the Emergency Department and triage nurse.   

## 2017-07-04 ENCOUNTER — Telehealth: Payer: Self-pay | Admitting: Internal Medicine

## 2017-07-04 DIAGNOSIS — G4733 Obstructive sleep apnea (adult) (pediatric): Secondary | ICD-10-CM | POA: Diagnosis not present

## 2017-07-04 NOTE — Telephone Encounter (Signed)
Study results still pending, should have the results in next 1-2 days.

## 2017-07-04 NOTE — Telephone Encounter (Signed)
Urgent!!  Patient's daughter will sent epic message as well. Please review sleep study.

## 2017-07-04 NOTE — Telephone Encounter (Signed)
Pt daughter would like to know what the follow up is from pt sleep study. Please call to advise. Pt daughter is a physician and states it is ok to leave a detailed message on cell.

## 2017-07-06 ENCOUNTER — Other Ambulatory Visit: Payer: Self-pay | Admitting: *Deleted

## 2017-07-06 DIAGNOSIS — G4733 Obstructive sleep apnea (adult) (pediatric): Secondary | ICD-10-CM

## 2017-07-06 NOTE — Telephone Encounter (Signed)
Called patient's daughter Dr. [name] and made aware orders have been placed for cpap. Pt also aware of results and orders placed.

## 2017-07-14 NOTE — Progress Notes (Signed)
Cardiology Office Note  Date:  07/17/2017   ID:  David Stall., DOB [birth year], MRN [id number]  PCP:  [name], MD   Chief Complaint  Patient presents with  . other    Pt mentioned that he has a device& would like to get it checked today c/o elevated BP. Pt does not recall name prior cardiologist name or type of device. Meds reviewed verbally with pt.    HPI:  David [name] is a 77 year old gentleman with past medical history of Former smoker, quit 1960 etoh abuse CAD, CABG 2014 (prior angina sx : syncope) Cardiomyopathy, resolved Syncope, 3rd degree heart block, pacer, upgrade to [facility]. Jude ICD CRT-P 02/25/13 [place]  osa on CPAP metastatic melanoma stage IV, brain surgery 1 -2 years ago Memory issues, recent events aphasia, not able to communicate much. Referred by pulmonary, Dr. Juanell Fairly  for SOB, history of cardiomyopathy  He reports that he has been doing well since his bypass surgery 2014 Previous anginal symptoms were syncope  2 months ago, CPAP broke Has not felt well without the CPAP Restless, more short of breath, waking frequently, tired  Got a new machine yesterday,  Feels back to normal, feels well, good energy, no problems with his breathing Denies any chest pain on exertion  Long discussion concerning history of melanoma "stable" melanoma "problems are gone"  Since his brain surgery, "legs are dead",  no exercise, sedentary  Previous records reviewed PET scan 08/3974 hypermetabolic 7.3AL lingular nodule, and a 65mm right upper lobe lung nodule, left subclavicular nodes, and bone metastasis in the proximal right humerus and right femur.  Mets to head  Echo 09/29/2016 Left ventricle: The cavity size was normal. Wall thickness was   increased in a pattern of mild LVH. Systolic function was normal.   The estimated ejection fraction was in the range of 60% to 65%.   Wall motion was normal; there were no regional wall motion   abnormalities.  Doppler parameters are consistent with abnormal   left ventricular relaxation (grade 1 diastolic dysfunction).  EKG personally reviewed by myself on todays visit Shows paced rhythm rate 78 bpm  PMH:   has a past medical history of Anginal pain ([place]), Arthritis, Benign familial hematuria, BPH (benign prostatic hypertrophy), Brain cancer ([place]), Coronary artery disease, GERD (gastroesophageal reflux disease), High cholesterol, Hypertension, Hypothyroidism, Intracerebral hemorrhage ([place]), Melanoma ([place]), Metastatic melanoma to lung ([place]), Mood disorder ([place]), Myocardial infarction [facility]), Pacemaker, Rectal bleed, Renal disorder, Skin cancer, Sleep apnea, and Stroke ([place]).  PSH:    Past Surgical History:  Procedure Laterality Date  . APPLICATION OF CRANIAL NAVIGATION N/A 10/15/2015   Procedure: APPLICATION OF CRANIAL NAVIGATION;  Surgeon: Erline Levine, MD;  Location: [facility] NEURO ORS;  Service: Neurosurgery;  Laterality: N/A;  . CARDIAC CATHETERIZATION  2013  . CARDIAC SURGERY     bypass X 2  . COLONOSCOPY WITH PROPOFOL N/A 05/08/2016   Procedure: COLONOSCOPY WITH PROPOFOL;  Surgeon: Jonathon Bellows, MD;  Location: [facility] ENDOSCOPY;  Service: Gastroenterology;  Laterality: N/A;  . CORONARY ARTERY BYPASS GRAFT  2013   LIMA-LAD, SVG-PDA 03/27/11 (Dr. Francee Gentile)  . CRANIOTOMY N/A 10/15/2015   Procedure: LEFT FRONTAL CRANIOTOMY TUMOR EXCISION with Curve;  Surgeon: Erline Levine, MD;  Location: [facility] NEURO ORS;  Service: Neurosurgery;  Laterality: N/A;  CRANIOTOMY TUMOR EXCISION  . EYE SURGERY    . FOOT SURGERY  08/2010  . JOINT REPLACEMENT Left    partial knee  . KNEE ARTHROSCOPY  12/2008  . LYMPH  NODE BIOPSY    . PACEMAKER INSERTION    . TONSILLECTOMY      Current Outpatient Medications  Medication Sig Dispense Refill  . acetaminophen (TYLENOL) 500 MG tablet Take 500 mg by mouth at bedtime.    Marland Kitchen amLODipine (NORVASC) 5 MG tablet Take 1 tablet (5 mg total) by mouth daily. 90 tablet 3  . aspirin EC 81 MG tablet  Take 81 mg by mouth daily.    . fluocinonide cream (LIDEX) 0.05 %     . levothyroxine (SYNTHROID, LEVOTHROID) 137 MCG tablet Take 1 tablet (137 mcg total) by mouth daily before breakfast. 30 tablet 3  . metoprolol succinate (TOPROL-XL) 50 MG 24 hr tablet Take 1.5 tablets daily (75mg ). Take with or immediately following a meal. 135 tablet 3  . pentoxifylline (TRENTAL) 400 MG CR tablet Take 1 tablet (400 mg total) by mouth 2 (two) times daily. 60 tablet 6  . rosuvastatin (CRESTOR) 10 MG tablet Take 10 mg by mouth at bedtime.    . sertraline (ZOLOFT) 100 MG tablet     . testosterone cypionate (DEPOTESTOTERONE CYPIONATE) 100 MG/ML injection Inject 100 mg into the muscle every 14 (fourteen) days.     . Turmeric 500 MG CAPS Take 2 capsules by mouth daily.    . vitamin E (VITAMIN E) 400 UNIT capsule Take 1 capsule (400 Units total) by mouth 2 (two) times daily. 60 capsule 6   No current facility-administered medications for this visit.    Facility-Administered Medications Ordered in Other Visits  Medication Dose Route Frequency Provider Last Rate Last Dose  . 0.9 %  sodium chloride infusion  1,000 mL Intravenous Once [name], MD         Allergies:   Zocor [simvastatin]   Social History:  The patient  reports that he quit smoking about 60 years ago. He has a 3.00 pack-year smoking history. He has never used smokeless tobacco. He reports that he drinks about 24.0 oz of alcohol per week. He reports that he does not use drugs.   Family History:   family history includes Cancer in his maternal grandmother; Cancer (age of onset: 21) in his mother; Heart attack in his father; Heart disease in his father; Hypertension in his father and mother; Rheum arthritis in his sister.    Review of Systems: Review of Systems  Constitutional: Negative.   Respiratory: Negative.   Cardiovascular: Negative.   Gastrointestinal: Negative.   Musculoskeletal: Negative.        Gait instability  Neurological:  Negative.   Psychiatric/Behavioral: Negative.   All other systems reviewed and are negative.    PHYSICAL EXAM: VS:  BP 115/79 (BP Location: Right Arm, Patient Position: Sitting, Cuff Size: Normal)   Pulse 78   Ht 5' 8.5" (1.74 m)   Wt 201 lb 12 oz (91.5 kg)   BMI 30.23 kg/m  , BMI Body mass index is 30.23 kg/m. GEN: Well nourished, well developed, in no acute distress , walks with a cane HEENT: normal  Neck: no JVD, carotid bruits, or masses Cardiac: RRR; no murmurs, rubs, or gallops,no edema  Respiratory:  clear to auscultation bilaterally, normal work of breathing GI: soft, nontender, nondistended, + BS MS: no deformity or atrophy  Skin: warm and dry, no rash Neuro:  Strength and sensation are intact Psych: euthymic mood, full affect    Recent Labs: 12/18/2016: Magnesium 2.5 07/16/2017: ALT 18; BUN 37; Creatinine, Ser 1.51; Hemoglobin 15.8; Platelets 159; Potassium 5.0; Sodium 141; TSH 2.267  Lipid Panel Lab Results  Component Value Date   CHOL 178 05/10/2017   HDL 38.30 (L) 05/10/2017   LDLCALC 135 (H) 08/24/2015   TRIG 258.0 (H) 05/10/2017      Wt Readings from Last 3 Encounters:  07/17/17 201 lb 12 oz (91.5 kg)  07/16/17 201 lb (91.2 kg)  06/18/17 202 lb 12.8 oz (92 kg)       ASSESSMENT AND PLAN:  Atherosclerosis of native coronary artery of native heart with stable angina pectoris ([place]) - Plan: EKG 12-Lead  Hx of CABG - Plan: EKG 12-Lead Reports he has done well since bypass surgery 5 years ago Does not feel that he needs any ischemic workup Active, no chest pain or shortness of breath on exertion Long discussion concerning anginal symptoms to watch for He will call us for any new symptoms  Ischemic cardiomyopathy - Plan: EKG 12-Lead Improvement in his ejection fraction, most recent echocardiogram EF 60-65% He does not want to change his medications at this time  Biventricular ICD (implantable cardioverter-defibrillator) in place We will place a  referral to Dr. Caryl Comes, EP Reports that he feels well with heart rate set at 75, does not feel well when this runs lower  Mixed hyperlipidemia Long discussion, numbers not at goal Reports having problem in the past on Zetia We will increase Crestor up to 20 mg daily, goal LDL less than 70  Benign essential HTN Blood pressure is well controlled on today's visit. No changes made to the medications.  Secondary malignant melanoma of lung, unspecified laterality ([place]) Brain surgery, Reports PET scans negative   Disposition:   F/U  6 months   Total encounter time more than 60 minutes  Greater than 50% was spent in counseling and coordination of care with the patient    Orders Placed This Encounter  Procedures  . EKG 12-Lead     Signed, Esmond Plants, M.D., Ph.D. 07/17/2017  [place], [place]

## 2017-07-16 ENCOUNTER — Inpatient Hospital Stay: Payer: Medicare Other | Attending: Hematology

## 2017-07-16 ENCOUNTER — Encounter: Payer: Self-pay | Admitting: Nurse Practitioner

## 2017-07-16 ENCOUNTER — Inpatient Hospital Stay: Payer: Medicare Other

## 2017-07-16 ENCOUNTER — Inpatient Hospital Stay (HOSPITAL_BASED_OUTPATIENT_CLINIC_OR_DEPARTMENT_OTHER): Payer: Medicare Other | Admitting: Nurse Practitioner

## 2017-07-16 ENCOUNTER — Other Ambulatory Visit: Payer: Self-pay | Admitting: Medical

## 2017-07-16 VITALS — BP 117/86 | HR 75 | Temp 98.0°F | Resp 20 | Ht 68.0 in | Wt 201.0 lb

## 2017-07-16 DIAGNOSIS — R5383 Other fatigue: Secondary | ICD-10-CM

## 2017-07-16 DIAGNOSIS — I25810 Atherosclerosis of coronary artery bypass graft(s) without angina pectoris: Secondary | ICD-10-CM

## 2017-07-16 DIAGNOSIS — C770 Secondary and unspecified malignant neoplasm of lymph nodes of head, face and neck: Secondary | ICD-10-CM | POA: Insufficient documentation

## 2017-07-16 DIAGNOSIS — I129 Hypertensive chronic kidney disease with stage 1 through stage 4 chronic kidney disease, or unspecified chronic kidney disease: Secondary | ICD-10-CM

## 2017-07-16 DIAGNOSIS — Z8673 Personal history of transient ischemic attack (TIA), and cerebral infarction without residual deficits: Secondary | ICD-10-CM | POA: Diagnosis not present

## 2017-07-16 DIAGNOSIS — N183 Chronic kidney disease, stage 3 (moderate): Secondary | ICD-10-CM | POA: Diagnosis not present

## 2017-07-16 DIAGNOSIS — C799 Secondary malignant neoplasm of unspecified site: Secondary | ICD-10-CM

## 2017-07-16 DIAGNOSIS — C7951 Secondary malignant neoplasm of bone: Secondary | ICD-10-CM | POA: Insufficient documentation

## 2017-07-16 DIAGNOSIS — F329 Major depressive disorder, single episode, unspecified: Secondary | ICD-10-CM | POA: Insufficient documentation

## 2017-07-16 DIAGNOSIS — Z5112 Encounter for antineoplastic immunotherapy: Secondary | ICD-10-CM | POA: Diagnosis not present

## 2017-07-16 DIAGNOSIS — E291 Testicular hypofunction: Secondary | ICD-10-CM

## 2017-07-16 DIAGNOSIS — L308 Other specified dermatitis: Secondary | ICD-10-CM

## 2017-07-16 DIAGNOSIS — R21 Rash and other nonspecific skin eruption: Secondary | ICD-10-CM

## 2017-07-16 DIAGNOSIS — C7931 Secondary malignant neoplasm of brain: Secondary | ICD-10-CM | POA: Diagnosis not present

## 2017-07-16 DIAGNOSIS — R413 Other amnesia: Secondary | ICD-10-CM | POA: Insufficient documentation

## 2017-07-16 DIAGNOSIS — L84 Corns and callosities: Secondary | ICD-10-CM | POA: Insufficient documentation

## 2017-07-16 DIAGNOSIS — G4700 Insomnia, unspecified: Secondary | ICD-10-CM | POA: Diagnosis not present

## 2017-07-16 DIAGNOSIS — I619 Nontraumatic intracerebral hemorrhage, unspecified: Secondary | ICD-10-CM

## 2017-07-16 DIAGNOSIS — I10 Essential (primary) hypertension: Secondary | ICD-10-CM | POA: Diagnosis not present

## 2017-07-16 DIAGNOSIS — C7800 Secondary malignant neoplasm of unspecified lung: Secondary | ICD-10-CM | POA: Diagnosis not present

## 2017-07-16 DIAGNOSIS — E039 Hypothyroidism, unspecified: Secondary | ICD-10-CM

## 2017-07-16 DIAGNOSIS — C439 Malignant melanoma of skin, unspecified: Secondary | ICD-10-CM | POA: Diagnosis not present

## 2017-07-16 DIAGNOSIS — L989 Disorder of the skin and subcutaneous tissue, unspecified: Secondary | ICD-10-CM

## 2017-07-16 LAB — COMPREHENSIVE METABOLIC PANEL
ALBUMIN: 3.5 g/dL (ref 3.5–5.0)
ALK PHOS: 58 U/L (ref 40–150)
ALT: 18 U/L (ref 0–55)
ANION GAP: 10 (ref 3–11)
AST: 19 U/L (ref 5–34)
BUN: 37 mg/dL — ABNORMAL HIGH (ref 7–26)
CALCIUM: 9.2 mg/dL (ref 8.4–10.4)
CHLORIDE: 110 mmol/L — AB (ref 98–109)
CO2: 21 mmol/L — AB (ref 22–29)
Creatinine, Ser: 1.51 mg/dL — ABNORMAL HIGH (ref 0.70–1.30)
GFR calc non Af Amer: 42 mL/min — ABNORMAL LOW (ref >60)
GFR, EST AFRICAN AMERICAN: 49 mL/min — AB (ref >60)
GLUCOSE: 170 mg/dL — AB (ref 70–140)
Potassium: 5 mmol/L (ref 3.5–5.1)
SODIUM: 141 mmol/L (ref 136–145)
Total Bilirubin: 0.4 mg/dL (ref 0.2–1.2)
Total Protein: 7 g/dL (ref 6.4–8.3)

## 2017-07-16 LAB — CBC WITH DIFFERENTIAL/PLATELET
Basophils Absolute: 0 10*3/uL (ref 0.0–0.1)
Basophils Relative: 1 %
Eosinophils Absolute: 0.3 10*3/uL (ref 0.0–0.5)
Eosinophils Relative: 7 %
HEMATOCRIT: 47.5 % (ref 38.4–49.9)
HEMOGLOBIN: 15.8 g/dL (ref 13.0–17.1)
LYMPHS ABS: 0.5 10*3/uL — AB (ref 0.9–3.3)
LYMPHS PCT: 9 %
MCH: 30.2 pg (ref 27.2–33.4)
MCHC: 33.3 g/dL (ref 32.0–36.0)
MCV: 90.6 fL (ref 79.3–98.0)
MONO ABS: 0.5 10*3/uL (ref 0.1–0.9)
MONOS PCT: 10 %
NEUTROS ABS: 3.7 10*3/uL (ref 1.5–6.5)
NEUTROS PCT: 73 %
Platelets: 159 10*3/uL (ref 140–400)
RBC: 5.24 MIL/uL (ref 4.20–5.82)
RDW: 14 % (ref 11.0–14.6)
WBC: 5 10*3/uL (ref 4.0–10.3)

## 2017-07-16 LAB — TSH: TSH: 2.267 u[IU]/mL (ref 0.320–4.118)

## 2017-07-16 NOTE — Progress Notes (Addendum)
[place]  Telephone:(336) [phone] Fax:(336) [phone]  Clinic Follow up Note   Patient Care Team: [name], MD as PCP - General (Family Medicine) 07/16/2017  SUMMARY OF ONCOLOGIC HISTORY: Oncology History   Metastatic melanoma [facility])   Staging form: Melanoma of the Skin, AJCC 7th Edition     Clinical stage from 09/21/2015: Stage IV [place], [place], [place]) - Signed by [name], MD on 10/09/2015       Malignant melanoma ([place])   09/16/2015 Imaging    PET scan showed a hypermetabolic 7.3ZH lingular nodule, and a 28m right upper lobe lung nodule, left subclavicular nodes, and bone metastasis in the proximal right humerus and right femur.       09/21/2015 Initial Diagnosis    Metastatic melanoma [place]      09/21/2015 Initial Biopsy    Left subclavicular lymph node biopsy showed prostatic of melanoma      09/21/2015 Miscellaneous    BRAF V600K mutation (+)       10/04/2015 Imaging    CT head with without contrast showed a 2.0 x 1.6 x 1.7 cm superficial left frontal hyperdense lesion, with postcontrast enhancement, lying within the previous hemorrhagic area, concerning for solitary melanoma metastasis      10/06/2015 - 09/06/2016 Chemotherapy    Nivolumab '240mg'$  every 2 weeks, held due to his dexamethasone for radionecrosis       10/13/2015 - 10/13/2015 Radiation Therapy    10/13/2015 Preop SRS treatment: 14 Gy  to the left frontal lesion in 1 fraction      10/15/2015 Surgery    left front craniotomy and tumor excision       10/15/2015 Pathology Results    left frontal brain mass excision showed metastatic melanoma, 1.5X1.5X0.6cm      01/13/2016 Imaging    CT head without and with contrast showed ill-defined enhancement and dural thickening of the left frontal resection cavity may represent a post treatment changes or residual neoplasm. A new 6 mm hypodensity in the right cerebellar hemisphere with uncertainty enhancement may represent a metastasis or small brain parenchymal  hemorrhage.       01/27/2016 - 01/27/2016 Radiation Therapy    01/27/16 SRS Treatment:  20 Gy in 1 fraction to a 6 mm right cerebellar brain met       03/29/2016 PET scan    PET 03/29/2016 IMPRESSION: 1. Overall there has been an interval response to therapy. The index lesions sided on previous exam it are less hypermetabolic when compared with the previous exam. 2. There is an intramuscular lesion within the right masseter which has increased FDG uptake compared with previous exam. 3. No new sites of disease identified. 4. Aortic atherosclerosis and infrarenal abdominal aortic aneurysm. Recommend followup by ultrasound in 3 years. This recommendation follows ACR consensus guidelines: White Paper of the ACR Incidental Findings Committee II on Vascular Findings. JJoellyn RuedRadiol 2013; 10:789-794      04/20/2016 Imaging    CT Head w wo contrast 1. Mild patchy enhancement at the left anterior frontal lobe treatment site (series 11, image 39) without mass effect and stable surrounding white matter hypodensity without strong evidence of acute vasogenic edema. Recommend continued CT surveillance. 2. No new metastatic disease or new intracranial abnormality identified. 3. Probable benign 14 mm left parietal bone lucent lesion, unchanged since May 2017.      05/06/2016 - 05/08/2016 Hospital Admission    Pt was admitted for rectal bleeding for one day. Breathing spontaneously resolved, hemoglobin  dropped from 15 to 10.5, did not require blood transfusion. Colonoscopy showed no active bleeding, except diverticulosis and hemorrhoid. He was discharged to home.      05/08/2016 Procedure    Colonoscopy  - Stool in the ascending colon. Fluid aspiration performed. - Diverticulosis in the entire examined colon. - Non-bleeding internal hemorrhoids. - The examination was otherwise normal on direct and retroflexion views.      05/29/2016 Imaging    PET Image Restaging IMPRESSION: 1. Stable  exam with no evidence progression. 2. Photopenia in the LEFT frontal lobe at prior surgical site. 3. Stable mildly hypermetabolic RIGHT paratracheal node. This may represent a thyroid nodule. 4. Stable size of minimally metabolic lingular pulmonary nodule. 5. Stable skeletal lesion of the RIGHT iliac crest 6. No cutaneous metabolic lesion.      07/20/2016 Imaging    CT Head W WO Contrast IMPRESSION: Pronounced change in the left frontal region. Much more regional vasogenic edema. Relatively stable appearance of the abnormal enhancement extending from the lateral margin of the frontal horn of left lateral ventricle to the frontal brain surface. New region of abnormal enhancement surrounding the frontal horn of the left lateral ventricle measuring approximately 3.2 cm. The differential diagnosis is recurrent tumor versus radiation necrosis. The patient is reportedly not an MRI candidate. This area is large enough that CT perfusion could possibly make a reasonable differentiation.      09/02/2016 Imaging    CT Head 09/02/16 IMPRESSION: Somewhat mixed pattern of slight posterior progression of pleomorphic enhancement surrounding the LEFT lateral ventricle although no significant mass effect, and reduced vasogenic edema compared with the scan in April.      09/27/2016 - 11/02/2016 Chemotherapy    Start dabrafenib and trametinib 09/27/16, stopped due to severe fatigue        10/05/2016 Imaging    CT Head w & w/o contrast  IMPRESSION: 1. Decreased size of the enhancing area within the left frontal lobe treatment site. Surrounding edema has also decreased. The findings support continued evolution of radiation necrosis. 2. No new enhancing lesions.      10/18/2016 PET scan    IMPRESSION: 1. New focal hypermetabolism in the proximal sigmoid colon with associated focal colonic wall thickening and mild pericolonic fat stranding at the site of a proximal sigmoid diverticulum, favored  to represent mild acute sigmoid diverticulitis. Metastatic disease is considered less likely. Clinical correlation is necessary. Consider appropriate antibiotic therapy, as clinically warranted. Attention to this region recommended on future PET-CT follow-up. 2. Otherwise complete metabolic response with no evidence of hypermetabolic metastatic disease. Lingular pulmonary nodule is slightly decreased in size and demonstrates no significant metabolism. 3. No residual hypermetabolism in the right thyroid lobe nodule, which is stable in size. 4. Aortic Atherosclerosis (ICD10-I70.0). Stable 3.1 cm infrarenal abdominal aortic aneurysm. Aortic aneurysm NOS (ICD10-I71.9). Recommend followup by ultrasound in 3 years. This recommendation follows ACR consensus guidelines: White Paper of the ACR Incidental Findings Committee II on Vascular Findings. Joellyn Rued Radiol 2013; 70:177-939.      11/01/2016 - 11/04/2016 Hospital Admission    Patient admitted to the hospital due to hypercalcemia where he received IV fluids and pamidronate      12/13/2016 PET scan    PET 12/13/16 IMPRESSION: 1. No findings to suggest metastatic disease on today's examination. 2. Previously noted hypermetabolism in the sigmoid colon has resolved, presumably indicative of a focus of diverticulitis on the prior study. Today's study does demonstrate relatively diffuse hypermetabolism throughout the cecum, ascending  colon and proximal transverse colon where there is some mild mural thickening, favored to reflect mild chronic inflammation related to underlying diverticular disease. No definite inflammatory changes on the CT portion of the examination to strongly suggest an active acute diverticulitis at this time. No discrete lesion to suggest neoplasm. 3. Previously noted 8 mm lingular nodule is stable in size and known remains non hypermetabolic. This is nonspecific but reassuring. Continued attention on future followup  studies is recommended. 4. Aortic atherosclerosis, in addition to left main and 3 vessel coronary artery disease. Status post median sternotomy for CABG including LIMA to the LAD. In addition, there is mild fusiform aneurysmal dilatation of the infrarenal abdominal aorta (3.1 cm in diameter) as well as aneurysmal dilatation of the left common iliac      12/18/2016 -  Chemotherapy    Restarted nivolamab 231m every 2 weeks, with tapering dose dexamethasone        04/18/2017 Imaging    PET Scan IMPRESSION: 1. No hypermetabolic lesion to suggest active malignancy. 2. Stable 8 mm lingular nodule is not currently hypermetabolic but merit surveillance. Infrarenal abdominal aortic aneurysm 3.1 cm in diameter. Recommend followup by [place] 3 years. 3. Other imaging findings of potential clinical significance: Postoperative findings in the left frontal lobe. Right renal cyst. Aortic Atherosclerosis (ICD10-I70.0). Osteoarthritis. Lumbar scoliosis. Left knee effusion. Colonic diverticulosis.      CURRENT THERAPY:  Restarted nivolamab on 12/18/2016 and changed to every 4 weeks on 01/30/2017, changed back to every 2 weeks due to fatigue on 04/23/17  INTERVAL HISTORY: Dr. HKenton Kingfisherreturns for follow up as scheduled prior to next cycle nivolumab, s/p cycle 35 on 07/02/17. Continues to have mild, at times moderate, fatigue; still able to complete activities with effort. States his legs are "dead," weakness getting slightly worse. He is not very active. Has had a rash on his back, arms, and legs for "a while" that he thinks is getting worse, occasionally itches. Being seen by dermatologist Dr. SFontaine Noon 06/12/17 for a rash on his left knee and ankle. Prescribed lidex cream that he has not started. No fever or chills. Good appetite. Eating and drinking well. No nausea, vomiting, constipation, or diarrhea. Denies bleeding. No neurological changes. Occasional pain to right foot, followed by podiatry.    REVIEW OF  SYSTEMS:   Constitutional: Denies fevers, chills or abnormal weight loss (+) mild-moderate fatigue  Eyes: Denies blurriness of vision Ears, nose, mouth, throat, and face: Denies mucositis or sore throat Respiratory: Denies cough, dyspnea or wheezes Cardiovascular: Denies palpitation, chest discomfort or lower extremity swelling Gastrointestinal:  Denies nausea, vomiting, constipation, diarrhea, heartburn or change in bowel habits Skin:(+) abnormal itching skin rash to back, arms, and legs  Lymphatics: Denies new lymphadenopathy or easy bruising Neurological:Denies numbness, tingling, or neuro changes (+) lower extremity weakness, worse  Behavioral/Psych: Mood is stable, no new changes (+) impaired memory  All other systems were reviewed with the patient and are negative.  MEDICAL HISTORY:  Past Medical History:  Diagnosis Date  . Anginal pain ([place]   . Arthritis    mostly hands  . Benign familial hematuria   . BPH (benign prostatic hypertrophy)   . Brain cancer [place]    melanoma with brain met  . Coronary artery disease   . GERD (gastroesophageal reflux disease)   . High cholesterol   . Hypertension   . Hypothyroidism   . Intracerebral hemorrhage ([place]   . Melanoma ([place]   . Metastatic melanoma to lung ([place]   .  Mood disorder ([place])   . Myocardial infarction ([place])   . Pacemaker    due to syncope, 3rd degree HB (upgrade to [facility]. Jude CRT-P 02/25/13 (Dr. Uvaldo Rising)  . Rectal bleed    due to NSAIDS  . Renal disorder   . Skin cancer    melanoma  . Sleep apnea    uses CPAP  . Stroke [facility])     SURGICAL HISTORY: Past Surgical History:  Procedure Laterality Date  . APPLICATION OF CRANIAL NAVIGATION N/A 10/15/2015   Procedure: APPLICATION OF CRANIAL NAVIGATION;  Surgeon: Erline Levine, MD;  Location: [place] NEURO ORS;  Service: Neurosurgery;  Laterality: N/A;  . CARDIAC CATHETERIZATION  2013  . CARDIAC SURGERY     bypass X 2  . COLONOSCOPY WITH PROPOFOL N/A 05/08/2016   Procedure:  COLONOSCOPY WITH PROPOFOL;  Surgeon: Jonathon Bellows, MD;  Location: [facility] ENDOSCOPY;  Service: Gastroenterology;  Laterality: N/A;  . CORONARY ARTERY BYPASS GRAFT  2013   LIMA-LAD, SVG-PDA 03/27/11 (Dr. Francee Gentile)  . CRANIOTOMY N/A 10/15/2015   Procedure: LEFT FRONTAL CRANIOTOMY TUMOR EXCISION with Curve;  Surgeon: Erline Levine, MD;  Location: [place] NEURO ORS;  Service: Neurosurgery;  Laterality: N/A;  CRANIOTOMY TUMOR EXCISION  . EYE SURGERY    . FOOT SURGERY  08/2010  . JOINT REPLACEMENT Left    partial knee  . KNEE ARTHROSCOPY  12/2008  . LYMPH NODE BIOPSY    . PACEMAKER INSERTION    . TONSILLECTOMY      I have reviewed the social history and family history with the patient and they are unchanged from previous note.  ALLERGIES:  is allergic to zocor [simvastatin].  MEDICATIONS:  Current Outpatient Medications  Medication Sig Dispense Refill  . acetaminophen (TYLENOL) 500 MG tablet Take 500 mg by mouth at bedtime.    Marland Kitchen amLODipine (NORVASC) 5 MG tablet Take 1 tablet (5 mg total) by mouth daily. 90 tablet 3  . aspirin EC 81 MG tablet Take 81 mg by mouth daily.    Marland Kitchen levothyroxine (SYNTHROID, LEVOTHROID) 137 MCG tablet Take 1 tablet (137 mcg total) by mouth daily before breakfast. 30 tablet 3  . metoprolol succinate (TOPROL-XL) 50 MG 24 hr tablet Take 1.5 tablets daily ('75mg'$ ). Take with or immediately following a meal. 135 tablet 3  . pentoxifylline (TRENTAL) 400 MG CR tablet Take 1 tablet (400 mg total) by mouth 2 (two) times daily. 60 tablet 6  . rosuvastatin (CRESTOR) 10 MG tablet Take 10 mg by mouth at bedtime.    . sertraline (ZOLOFT) 100 MG tablet     . testosterone cypionate (DEPOTESTOTERONE CYPIONATE) 100 MG/ML injection Inject 100 mg into the muscle every 14 (fourteen) days.     . Turmeric 500 MG CAPS Take 2 capsules by mouth daily.    . vitamin E (VITAMIN E) 400 UNIT capsule Take 1 capsule (400 Units total) by mouth 2 (two) times daily. 60 capsule 6  . fluocinonide cream (LIDEX) 0.05 %       No current facility-administered medications for this visit.    Facility-Administered Medications Ordered in Other Visits  Medication Dose Route Frequency Provider Last Rate Last Dose  . 0.9 %  sodium chloride infusion  1,000 mL Intravenous Once [name], MD        PHYSICAL EXAMINATION: ECOG PERFORMANCE STATUS: 2-3   Vitals:   07/16/17 0833  BP: 117/86  Pulse: 75  Resp: 20  Temp: 98 F (36.7 C)  SpO2: 94%   Filed Weights   07/16/17 2979  Weight: 201 lb (91.2 kg)    GENERAL:alert, no distress and comfortable SKIN: skin color, texture, turgor are normal (+) scattered fine maculopapular rash to back and bilateral arms/hands (+) erythematous scaly patches/plaques to bilateral lower extremities  EYES: normal, Conjunctiva are pink and non-injected, sclera clear OROPHARYNX:no exudate, no erythema and lips, buccal mucosa, and tongue normal  LYMPH:  no palpable cervical, supraclavicular, or axillary lymphadenopathy LUNGS: clear to auscultation with normal breathing effort HEART: regular rate & rhythm and no murmurs and no lower extremity edema ABDOMEN:abdomen soft, non-tender and normal bowel sounds. No hepatomegaly  Musculoskeletal:no cyanosis of digits and no clubbing  NEURO: alert & oriented x 3 with fluent speech; ambulates with cane.   LABORATORY DATA:  I have reviewed the data as listed CBC Latest Ref Rng & Units 07/16/2017 07/02/2017 06/18/2017  WBC 4.0 - 10.3 K/uL 5.0 5.1 5.6  Hemoglobin 13.0 - 17.1 g/dL 15.8 16.3 17.7(H)  Hematocrit 38.4 - 49.9 % 47.5 48.7 52.1(H)  Platelets 140 - 400 K/uL 159 175 149     CMP Latest Ref Rng & Units 07/16/2017 07/02/2017 06/18/2017  Glucose 70 - 140 mg/dL 170(H) 113 103  BUN 7 - 26 mg/dL 37(H) 31(H) 36(H)  Creatinine 0.70 - 1.30 mg/dL 1.51(H) 1.28 1.63(H)  Sodium 136 - 145 mmol/L 141 142 141  Potassium 3.5 - 5.1 mmol/L 5.0 4.6 5.1  Chloride 98 - 109 mmol/L 110(H) 110(H) 107  CO2 22 - 29 mmol/L 21(L) 22 25  Calcium 8.4 - 10.4 mg/dL 9.2  10.0 10.6(H)  Total Protein 6.4 - 8.3 g/dL 7.0 6.7 7.3  Total Bilirubin 0.2 - 1.2 mg/dL 0.4 0.4 0.5  Alkaline Phos 40 - 150 U/L 58 56 57  AST 5 - 34 U/L _0 ALT 0 - 55 U/L _1 PATHOLOGY REPORT: Diagnosis 09/21/2015 Lymph node, needle/core biopsy, hypermetabolic left sub clavicular LN - METASTATIC MALIGNANT MELANOMA. - SEE COMMENT.  Diagnosis 10/15/2015 Brain, for tumor resection, Left frontal - METASTATIC MELANOMA.       RADIOGRAPHIC STUDIES: I have personally reviewed the radiological images as listed and agreed with the findings in the report. No results found.   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   1. Skin rash - Pruritic maculopapular rash to back, arms, hands; scaly plaque rash to legs 2. Metastatic melanoma to lung, node, bone and brain, stage IV, BRAF V600K mutation (+) 3. History of hemorrhagic stroke and brain metastasis 4. Memory loss and cognitive dysfunction 5. hyercalcemia and CKD stage III 6. Fatigue, hypothyroidism, and hypotestosteronemia 7. HTN 8. Depression, insomnia 9. Goals of care discussion 10. CAD 68. Plantar callus on R foot   Mr. Dobosz appears stable. He completed 35 cycles nivolumab on 07/02/17; he is tolerating well overall. Continues to report fatigue and slightly worse lower extremity weakness. Encouraged to exercise at the gym at his facility for strengthening. Dr. Burr Medico to send message to his previous physical therapist to see if he would benefit from additional PT. VS and weight stable; CBC unremarkable. Cr fluctuates, 1.51 today. I encouraged him to increase po liquids intake especially water, he agrees.  He has scattered maculopapular rash to his back, arms/hands, and slightly different appearing rash on his legs, more plaque-like. Followed by Dr. Fontaine No at [facility] who instructed him to apply topical lidex cream BID to legs for asteatotic dermatitis; he  has not started yet. The diagnosis was not available during today's visit; Dr. Burr Medico recommended punch biopsy. Duration is unclear and also unclear whether this is related to nivolumab; Dr. Burr Medico recommends holding treatment today to see if it clears with topical steroids. F/u in 2 weeks to monitor.   PLAN: -Punch biopsy of rash -Obtain derm office note -Hold nivolumab, begin topical lidex -Monitor rash, return for lab, f/u in 2 weeks -Increase exercise, gym vs restarting PT, message to physical therapist   All questions were answered. The patient knows to call the clinic with any problems, questions or concerns. No barriers to learning was detected. I spent 25 minutes counseling the patient face to face. The total time spent in the appointment was 35 minutes and more than 50% was on counseling and review of test results     Alla Feeling, NP 07/16/17    Addendum  I have seen the patient, examined him. I agree with the assessment and and plan and have edited the notes.   Mr Twombly has been having diffuse skin rashes in the past 1-2 months, pruritic, he was seen by dermatology last month, but has not filled the topical steroids which was prescribed. He denies any new medication recently. His rash could be related to [facility], I recommend a skin punch biopsy today, which was done by our NP Lucianne Lei today. Will hold on Nivo treatment today, to see if the rash will improve. Will decide if he would benefit from a short course of oral steroids after the biopsy result returns. Will see him back in 2 weeks  I communicated the above with pt's [name] today.   [name]  07/16/2017

## 2017-07-17 ENCOUNTER — Ambulatory Visit (INDEPENDENT_AMBULATORY_CARE_PROVIDER_SITE_OTHER): Payer: Medicare Other | Admitting: Cardiovascular Disease

## 2017-07-17 ENCOUNTER — Encounter: Payer: Self-pay | Admitting: Cardiovascular Disease

## 2017-07-17 VITALS — BP 115/79 | HR 78 | Ht 68.5 in | Wt 201.8 lb

## 2017-07-17 DIAGNOSIS — I255 Ischemic cardiomyopathy: Secondary | ICD-10-CM | POA: Diagnosis not present

## 2017-07-17 DIAGNOSIS — E782 Mixed hyperlipidemia: Secondary | ICD-10-CM

## 2017-07-17 DIAGNOSIS — Z95810 Presence of automatic (implantable) cardiac defibrillator: Secondary | ICD-10-CM

## 2017-07-17 DIAGNOSIS — I25118 Atherosclerotic heart disease of native coronary artery with other forms of angina pectoris: Secondary | ICD-10-CM | POA: Diagnosis not present

## 2017-07-17 DIAGNOSIS — I10 Essential (primary) hypertension: Secondary | ICD-10-CM | POA: Diagnosis not present

## 2017-07-17 DIAGNOSIS — C7800 Secondary malignant neoplasm of unspecified lung: Secondary | ICD-10-CM

## 2017-07-17 DIAGNOSIS — Z951 Presence of aortocoronary bypass graft: Secondary | ICD-10-CM | POA: Diagnosis not present

## 2017-07-17 MED ORDER — ROSUVASTATIN CALCIUM 20 MG PO TABS: 20.0000 mg | ORAL_TABLET | Freq: Every day | ORAL | 3 refills | Status: DC

## 2017-07-17 NOTE — Patient Instructions (Signed)
Medication Instructions:   Please increase the crestor up to 20 mg daily  Labwork:  No new labs needed  Testing/Procedures:  No further testing at this time   Follow-Up: It was a pleasure seeing you in the office today. Please call us if you have new issues that need to be addressed before your next appt.  [phone]  Your physician wants you to follow-up in: 12 months.  You will receive a reminder letter in the mail two months in advance. If you don't receive a letter, please call our office to schedule the follow-up appointment.  If you need a refill on your cardiac medications before your next appointment, please call your pharmacy.  For educational health videos Log in to : [email or web address] Or : [email or web address], password : [email or web address]

## 2017-07-29 NOTE — Progress Notes (Signed)
[facility]  [phone] [phone]) [phone]  Clinic Follow up Note   Patient Care Team: [name], MD as PCP - General (Family Medicine) 07/30/2017  SUMMARY OF ONCOLOGIC HISTORY: Oncology History   Metastatic melanoma [facility])   Staging form: Melanoma of the Skin, AJCC 7th Edition     Clinical stage from 09/21/2015: Stage IV [place], [place], [place]) - Signed by [name], MD on 10/09/2015       Malignant melanoma ([place])   09/16/2015 Imaging    PET scan showed a hypermetabolic 8.1XB lingular nodule, and a 22m right upper lobe lung nodule, left subclavicular nodes, and bone metastasis in the proximal right humerus and right femur.       09/21/2015 Initial Diagnosis    Metastatic melanoma ([place]      09/21/2015 Initial Biopsy    Left subclavicular lymph node biopsy showed prostatic of melanoma      09/21/2015 Miscellaneous    BRAF V600K mutation (+)       10/04/2015 Imaging    CT head with without contrast showed a 2.0 x 1.6 x 1.7 cm superficial left frontal hyperdense lesion, with postcontrast enhancement, lying within the previous hemorrhagic area, concerning for solitary melanoma metastasis      10/06/2015 - 09/06/2016 Chemotherapy    Nivolumab '240mg'$  every 2 weeks, held due to his dexamethasone for radionecrosis       10/13/2015 - 10/13/2015 Radiation Therapy    10/13/2015 Preop SRS treatment: 14 Gy  to the left frontal lesion in 1 fraction      10/15/2015 Surgery    left front craniotomy and tumor excision       10/15/2015 Pathology Results    left frontal brain mass excision showed metastatic melanoma, 1.5X1.5X0.6cm      01/13/2016 Imaging    CT head without and with contrast showed ill-defined enhancement and dural thickening of the left frontal resection cavity may represent a post treatment changes or residual neoplasm. A new 6 mm hypodensity in the right cerebellar hemisphere with uncertainty enhancement may represent a metastasis or small brain  parenchymal hemorrhage.       01/27/2016 - 01/27/2016 Radiation Therapy    01/27/16 SRS Treatment:  20 Gy in 1 fraction to a 6 mm right cerebellar brain met       03/29/2016 PET scan    PET 03/29/2016 IMPRESSION: 1. Overall there has been an interval response to therapy. The index lesions sided on previous exam it are less hypermetabolic when compared with the previous exam. 2. There is an intramuscular lesion within the right masseter which has increased FDG uptake compared with previous exam. 3. No new sites of disease identified. 4. Aortic atherosclerosis and infrarenal abdominal aortic aneurysm. Recommend followup by ultrasound in 3 years. This recommendation follows ACR consensus guidelines: White Paper of the ACR Incidental Findings Committee II on Vascular Findings. JJoellyn RuedRadiol 2013; 10:789-794      04/20/2016 Imaging    CT Head w wo contrast 1. Mild patchy enhancement at the left anterior frontal lobe treatment site (series 11, image 39) without mass effect and stable surrounding white matter hypodensity without strong evidence of acute vasogenic edema. Recommend continued CT surveillance. 2. No new metastatic disease or new intracranial abnormality identified. 3. Probable benign 14 mm left parietal bone lucent lesion, unchanged since May 2017.      05/06/2016 - 05/08/2016 Hospital Admission    Pt was admitted for rectal bleeding for one day. Breathing spontaneously resolved, hemoglobin  dropped from 15 to 10.5, did not require blood transfusion. Colonoscopy showed no active bleeding, except diverticulosis and hemorrhoid. He was discharged to home.      05/08/2016 Procedure    Colonoscopy  - Stool in the ascending colon. Fluid aspiration performed. - Diverticulosis in the entire examined colon. - Non-bleeding internal hemorrhoids. - The examination was otherwise normal on direct and retroflexion views.      05/29/2016 Imaging    PET Image  Restaging IMPRESSION: 1. Stable exam with no evidence progression. 2. Photopenia in the LEFT frontal lobe at prior surgical site. 3. Stable mildly hypermetabolic RIGHT paratracheal node. This may represent a thyroid nodule. 4. Stable size of minimally metabolic lingular pulmonary nodule. 5. Stable skeletal lesion of the RIGHT iliac crest 6. No cutaneous metabolic lesion.      07/20/2016 Imaging    CT Head W WO Contrast IMPRESSION: Pronounced change in the left frontal region. Much more regional vasogenic edema. Relatively stable appearance of the abnormal enhancement extending from the lateral margin of the frontal horn of left lateral ventricle to the frontal brain surface. New region of abnormal enhancement surrounding the frontal horn of the left lateral ventricle measuring approximately 3.2 cm. The differential diagnosis is recurrent tumor versus radiation necrosis. The patient is reportedly not an MRI candidate. This area is large enough that CT perfusion could possibly make a reasonable differentiation.      09/02/2016 Imaging    CT Head 09/02/16 IMPRESSION: Somewhat mixed pattern of slight posterior progression of pleomorphic enhancement surrounding the LEFT lateral ventricle although no significant mass effect, and reduced vasogenic edema compared with the scan in April.      09/27/2016 - 11/02/2016 Chemotherapy    Start dabrafenib and trametinib 09/27/16, stopped due to severe fatigue        10/05/2016 Imaging    CT Head w & w/o contrast  IMPRESSION: 1. Decreased size of the enhancing area within the left frontal lobe treatment site. Surrounding edema has also decreased. The findings support continued evolution of radiation necrosis. 2. No new enhancing lesions.      10/18/2016 PET scan    IMPRESSION: 1. New focal hypermetabolism in the proximal sigmoid colon with associated focal colonic wall thickening and mild pericolonic fat stranding at the site of a proximal  sigmoid diverticulum, favored to represent mild acute sigmoid diverticulitis. Metastatic disease is considered less likely. Clinical correlation is necessary. Consider appropriate antibiotic therapy, as clinically warranted. Attention to this region recommended on future PET-CT follow-up. 2. Otherwise complete metabolic response with no evidence of hypermetabolic metastatic disease. Lingular pulmonary nodule is slightly decreased in size and demonstrates no significant metabolism. 3. No residual hypermetabolism in the right thyroid lobe nodule, which is stable in size. 4. Aortic Atherosclerosis (ICD10-I70.0). Stable 3.1 cm infrarenal abdominal aortic aneurysm. Aortic aneurysm NOS (ICD10-I71.9). Recommend followup by ultrasound in 3 years. This recommendation follows ACR consensus guidelines: White Paper of the ACR Incidental Findings Committee II on Vascular Findings. Joellyn Rued Radiol 2013; 16:384-536.      11/01/2016 - 11/04/2016 Hospital Admission    Patient admitted to the hospital due to hypercalcemia where he received IV fluids and pamidronate      12/13/2016 PET scan    PET 12/13/16 IMPRESSION: 1. No findings to suggest metastatic disease on today's examination. 2. Previously noted hypermetabolism in the sigmoid colon has resolved, presumably indicative of a focus of diverticulitis on the prior study. Today's study does demonstrate relatively diffuse hypermetabolism throughout the cecum, ascending  colon and proximal transverse colon where there is some mild mural thickening, favored to reflect mild chronic inflammation related to underlying diverticular disease. No definite inflammatory changes on the CT portion of the examination to strongly suggest an active acute diverticulitis at this time. No discrete lesion to suggest neoplasm. 3. Previously noted 8 mm lingular nodule is stable in size and known remains non hypermetabolic. This is nonspecific but reassuring. Continued  attention on future followup studies is recommended. 4. Aortic atherosclerosis, in addition to left main and 3 vessel coronary artery disease. Status post median sternotomy for CABG including LIMA to the LAD. In addition, there is mild fusiform aneurysmal dilatation of the infrarenal abdominal aorta (3.1 cm in diameter) as well as aneurysmal dilatation of the left common iliac      12/18/2016 -  Chemotherapy    Restarted nivolamab '240mg'$  every 2 weeks, with tapering dose dexamethasone        04/18/2017 Imaging    PET Scan IMPRESSION: 1. No hypermetabolic lesion to suggest active malignancy. 2. Stable 8 mm lingular nodule is not currently hypermetabolic but merit surveillance. Infrarenal abdominal aortic aneurysm 3.1 cm in diameter. Recommend followup by [place] in 3 years. 3. Other imaging findings of potential clinical significance: Postoperative findings in the left frontal lobe. Right renal cyst. Aortic Atherosclerosis (ICD10-I70.0). Osteoarthritis. Lumbar scoliosis. Left knee effusion. Colonic diverticulosis.      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 Last nivolumab on 07/02/17. Treatment was held on 07/16/17 due to rash and fatigue/weakness. He applied topical steroid cream and scaly lesions on his legs are nearly resolved. Now flat and not itching. Scattered rash to back and arms/hands is faint today but he has not been as diligent with topical steroids has he has been with his legs. Hands to not itch. Remains to have low energy and "dead legs" which did not improve much with chemo break. Rests more than half of the day. Occasionally uses the stationary bike in his car port, has not been to the gym at his facility recently. He drinks 3 cups water per day and also drinks mild and orange juice. Has mild controlled  constipation, has 1-2 small BM per day. Occasional cough with clear sputum; no fever, chills, or dyspnea.   REVIEW OF SYSTEMS:   Constitutional: Denies fevers, chills or abnormal weight loss (+) low energy  Eyes: Denies blurriness of vision Ears, nose, mouth, throat, and face: Denies mucositis or sore throat Respiratory: Denies dyspnea or wheezes (+) occasional cough with clear sputum  Cardiovascular: Denies palpitation, chest discomfort or lower extremity swelling Gastrointestinal:  Denies nausea, vomiting, diarrhea, heartburn or change in bowel habits (+) mild constipation, BM q1-2 days  Skin: (+) skin rash, overall improving less itching, significantly improved on legs with lidex cream  Lymphatics: Denies new lymphadenopathy or easy bruising Neurological:Denies numbness, tingling or new weaknesses (+) no neurological changes   Behavioral/Psych: Mood is stable, no new changes  All other systems were reviewed with the patient and are negative.  MEDICAL HISTORY:  Past Medical History:  Diagnosis Date  . Anginal pain ([place])   . Arthritis    mostly hands  . Benign familial hematuria   . BPH (benign prostatic hypertrophy)   . Brain cancer ([place])    melanoma with brain met  . Coronary artery disease   .  GERD (gastroesophageal reflux disease)   . High cholesterol   . Hypertension   . Hypothyroidism   . Intracerebral hemorrhage ([place])   . Melanoma ([place])   . Metastatic melanoma to lung ([place])   . Mood disorder ([place])   . Myocardial infarction ([place])   . Pacemaker    due to syncope, 3rd degree HB (upgrade to [facility]. Jude CRT-P 02/25/13 (Dr. Uvaldo Rising)  . Rectal bleed    due to NSAIDS  . Renal disorder   . Skin cancer    melanoma  . Sleep apnea    uses CPAP  . Stroke [facility])     SURGICAL HISTORY: Past Surgical History:  Procedure Laterality Date  . APPLICATION OF CRANIAL NAVIGATION N/A 10/15/2015   Procedure: APPLICATION OF CRANIAL NAVIGATION;  Surgeon: Erline Levine, MD;  Location: [facility] NEURO  ORS;  Service: Neurosurgery;  Laterality: N/A;  . CARDIAC CATHETERIZATION  2013  . CARDIAC SURGERY     bypass X 2  . COLONOSCOPY WITH PROPOFOL N/A 05/08/2016   Procedure: COLONOSCOPY WITH PROPOFOL;  Surgeon: Jonathon Bellows, MD;  Location: [facility] ENDOSCOPY;  Service: Gastroenterology;  Laterality: N/A;  . CORONARY ARTERY BYPASS GRAFT  2013   LIMA-LAD, SVG-PDA 03/27/11 (Dr. Francee Gentile)  . CRANIOTOMY N/A 10/15/2015   Procedure: LEFT FRONTAL CRANIOTOMY TUMOR EXCISION with Curve;  Surgeon: Erline Levine, MD;  Location: [facility] NEURO ORS;  Service: Neurosurgery;  Laterality: N/A;  CRANIOTOMY TUMOR EXCISION  . EYE SURGERY    . FOOT SURGERY  08/2010  . JOINT REPLACEMENT Left    partial knee  . KNEE ARTHROSCOPY  12/2008  . LYMPH NODE BIOPSY    . PACEMAKER INSERTION    . TONSILLECTOMY      I have reviewed the social history and family history with the patient and they are unchanged from previous note.  ALLERGIES:  is allergic to zocor [simvastatin].  MEDICATIONS:  Current Outpatient Medications  Medication Sig Dispense Refill  . acetaminophen (TYLENOL) 500 MG tablet Take 500 mg by mouth at bedtime.    Marland Kitchen amLODipine (NORVASC) 5 MG tablet Take 1 tablet (5 mg total) by mouth daily. 90 tablet 3  . aspirin EC 81 MG tablet Take 81 mg by mouth daily.    . fluocinonide cream (LIDEX) 0.05 % Apply topically 2 (two) times daily as needed. 30 g 1  . levothyroxine (SYNTHROID, LEVOTHROID) 137 MCG tablet Take 1 tablet (137 mcg total) by mouth daily before breakfast. 30 tablet 3  . metoprolol succinate (TOPROL-XL) 50 MG 24 hr tablet Take 1.5 tablets daily ('75mg'$ ). Take with or immediately following a meal. 135 tablet 3  . pentoxifylline (TRENTAL) 400 MG CR tablet Take 1 tablet (400 mg total) by mouth 2 (two) times daily. 60 tablet 6  . rosuvastatin (CRESTOR) 20 MG tablet Take 1 tablet (20 mg total) by mouth at bedtime. 90 tablet 3  . sertraline (ZOLOFT) 100 MG tablet     . testosterone cypionate (DEPOTESTOTERONE CYPIONATE) 100  MG/ML injection Inject 100 mg into the muscle every 14 (fourteen) days.     . Turmeric 500 MG CAPS Take 2 capsules by mouth daily.    . vitamin E (VITAMIN E) 400 UNIT capsule Take 1 capsule (400 Units total) by mouth 2 (two) times daily. 60 capsule 6   No current facility-administered medications for this visit.    Facility-Administered Medications Ordered in Other Visits  Medication Dose Route Frequency Provider Last Rate Last Dose  . 0.9 %  sodium chloride infusion  1,000 mL  Intravenous Once [name], MD        PHYSICAL EXAMINATION: ECOG PERFORMANCE STATUS: 2-3  Vitals:   07/30/17 0956  BP: 117/80  Pulse: 77  Resp: 18  Temp: 97.6 F (36.4 C)  SpO2: 95%   Filed Weights   07/30/17 0956  Weight: 203 lb 6.4 oz (92.3 kg)    GENERAL:alert, no distress and comfortable SKIN: skin color, texture are normal, (+) scaly lesions to legs now flat and mildly erythematous, nearly resolved (+) faint scattered maculopapular rash to back, arms, hands  (+) generalized dry skin   EYES: normal, Conjunctiva are pink and non-injected, sclera clear OROPHARYNX:no exudate, no erythema and lips, buccal mucosa, and tongue normal  LYMPH:  no palpable cervical or supraclavicular lymphadenopathy LUNGS: clear to auscultation with normal breathing effort HEART: regular rate & rhythm and no murmurs and no lower extremity edema ABDOMEN:abdomen soft, non-tender and normal bowel sounds Musculoskeletal:no cyanosis of digits and no clubbing  NEURO: alert & oriented x 3 with fluent speech, ambulates with cane   LABORATORY DATA:  I have reviewed the data as listed CBC Latest Ref Rng & Units 07/30/2017 07/16/2017 07/02/2017  WBC 4.0 - 10.3 K/uL 4.0 5.0 5.1  Hemoglobin 13.0 - 17.1 g/dL 15.5 15.8 16.3  Hematocrit 38.4 - 49.9 % 46.5 47.5 48.7  Platelets 140 - 400 K/uL 154 159 175     CMP Latest Ref Rng & Units 07/30/2017 07/16/2017 07/02/2017  Glucose 70 - 140 mg/dL 128 170(H) 113  BUN 7 - 26 mg/dL 31(H) 37(H) 31(H)   Creatinine 0.70 - 1.30 mg/dL 1.41(H) 1.51(H) 1.28  Sodium 136 - 145 mmol/L 141 141 142  Potassium 3.5 - 5.1 mmol/L 4.5 5.0 4.6  Chloride 98 - 109 mmol/L 110(H) 110(H) 110(H)  CO2 22 - 29 mmol/L 23 21(L) 22  Calcium 8.4 - 10.4 mg/dL 9.5 9.2 10.0  Total Protein 6.4 - 8.3 g/dL 6.7 7.0 6.7  Total Bilirubin 0.2 - 1.2 mg/dL 0.5 0.4 0.4  Alkaline Phos 40 - 150 U/L 50 58 56  AST 5 - 34 U/L '18 19 23  '$ ALT 0 - 55 U/L '15 18 21   '$ PATHOLOGY REPORT: Diagnosis 09/21/2015 Lymph node, needle/core biopsy, hypermetabolic left sub clavicular LN - METASTATIC MALIGNANT MELANOMA. - SEE COMMENT.  Diagnosis 10/15/2015 Brain, for tumor resection, Left frontal - METASTATIC MELANOMA.     Diagnosis 07/16/17 Skin , left medial knee SPONGIOTIC PSORIASIFORM DERMATITIS, SEE DESCRIPTION Microscopic Comment There is a spongiotic and psoriasiform tissue reaction involving the epidermis with an underlying mononuclear cell infiltrate in which eosinophils are not conspicuous. This pattern can be seen in a chronic eczematous reaction such as a chronic contact dermatitis, id reaction, or nummular eczema. A special stain (PAS-F) is performed to determine the presence or absence of fungal hyphae in this biopsy. The PAS stain is negative for fungal hyphae, and the controls stained appropriately.   RADIOGRAPHIC STUDIES: I have personally reviewed the radiological images as listed and agreed with the findings in the report. No results found.   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   1. Skin rash - Pruritic maculopapular rash to back, arms, hands; scaly plaque rash to legs 2. Metastatic melanoma to lung, node, bone and brain,stage IV,BRAF V600K mutation (+) 3. History of hemorrhagic stroke and brain metastasis 4. Memory loss and cognitive dysfunction 5. hyercalcemia and  CKD stage III 6. Fatigue, hypothyroidism,  and hypotestosteronemia 7. HTN 8. Depression, insomnia 9. Goals of care discussion 10. CAD 15. Plantar callus on R foot  Dr. Kenton Kingfisher appears stable. He completed cycle 35 Nivolumab on 07/02/17. Treatment was held 07/16/17 for skin rash and generalized fatigue. Fatigue has not improved much with chemo break. I recommend she exercise on his stationary bike as well as go to the gym at his facility some days to gain strength and improve fatigue as previously recommended. He agrees to try. Skin rash on left medial leg was biopsied, path revealed spongiotic psoriasiform dermatitis, no evidence of fungal infection. Stains suggest chronic eczematous reaction such as a chronic contact dermatitis, ID reaction, or nummular eczema. I reviewed with Dr. Burr Medico who believes this is related to Nivolumab. The rash improved dramatically with lidex cream. I recommend he continue lidex; I reviewed risk of possible flare in the future. VS and weight stable. Labs reviewed, adequate to proceed with treatment. I recommend he increase po liquids especially water to improve renal function; Cr. 1.41 today, slightly improved. Will restart Nivolumab today and continue q2 weeks. Return in 2 weeks for lab and next cycle, f/u in 4 weeks.   PLAN:  -Labs reviewed, proceed with cycle 36 Nivolumab today, continue q2 weeks -Return in 2 weeks for next cycle -F/u with Dr. Burr Medico in 4 weeks -Continue Lidex, refilled today    All questions were answered. The patient knows to call the clinic with any problems, questions or concerns. No barriers to learning was detected. I spent 20 minutes counseling the patient face to face. The total time spent in the appointment was 25 minutes and more than 50% was on counseling and review of test results     Alla Feeling, NP 07/30/17

## 2017-07-30 ENCOUNTER — Inpatient Hospital Stay: Payer: Medicare Other

## 2017-07-30 ENCOUNTER — Inpatient Hospital Stay (HOSPITAL_BASED_OUTPATIENT_CLINIC_OR_DEPARTMENT_OTHER): Payer: Medicare Other | Admitting: Nurse Practitioner

## 2017-07-30 ENCOUNTER — Encounter: Payer: Self-pay | Admitting: Nurse Practitioner

## 2017-07-30 ENCOUNTER — Other Ambulatory Visit: Payer: Self-pay

## 2017-07-30 VITALS — BP 117/80 | HR 77 | Temp 97.6°F | Resp 18 | Ht 68.5 in | Wt 203.4 lb

## 2017-07-30 DIAGNOSIS — C439 Malignant melanoma of skin, unspecified: Secondary | ICD-10-CM

## 2017-07-30 DIAGNOSIS — N183 Chronic kidney disease, stage 3 unspecified: Secondary | ICD-10-CM

## 2017-07-30 DIAGNOSIS — C799 Secondary malignant neoplasm of unspecified site: Secondary | ICD-10-CM

## 2017-07-30 DIAGNOSIS — L308 Other specified dermatitis: Secondary | ICD-10-CM | POA: Diagnosis not present

## 2017-07-30 DIAGNOSIS — R21 Rash and other nonspecific skin eruption: Secondary | ICD-10-CM

## 2017-07-30 DIAGNOSIS — Z8673 Personal history of transient ischemic attack (TIA), and cerebral infarction without residual deficits: Secondary | ICD-10-CM

## 2017-07-30 DIAGNOSIS — I2510 Atherosclerotic heart disease of native coronary artery without angina pectoris: Secondary | ICD-10-CM

## 2017-07-30 DIAGNOSIS — I10 Essential (primary) hypertension: Secondary | ICD-10-CM

## 2017-07-30 DIAGNOSIS — I619 Nontraumatic intracerebral hemorrhage, unspecified: Secondary | ICD-10-CM

## 2017-07-30 DIAGNOSIS — R5383 Other fatigue: Secondary | ICD-10-CM | POA: Diagnosis not present

## 2017-07-30 DIAGNOSIS — E039 Hypothyroidism, unspecified: Secondary | ICD-10-CM | POA: Diagnosis not present

## 2017-07-30 DIAGNOSIS — E291 Testicular hypofunction: Secondary | ICD-10-CM

## 2017-07-30 DIAGNOSIS — G4700 Insomnia, unspecified: Secondary | ICD-10-CM

## 2017-07-30 DIAGNOSIS — I219 Acute myocardial infarction, unspecified: Secondary | ICD-10-CM | POA: Diagnosis not present

## 2017-07-30 DIAGNOSIS — C7800 Secondary malignant neoplasm of unspecified lung: Secondary | ICD-10-CM | POA: Diagnosis not present

## 2017-07-30 DIAGNOSIS — F329 Major depressive disorder, single episode, unspecified: Secondary | ICD-10-CM

## 2017-07-30 DIAGNOSIS — C7931 Secondary malignant neoplasm of brain: Secondary | ICD-10-CM | POA: Diagnosis not present

## 2017-07-30 DIAGNOSIS — C7951 Secondary malignant neoplasm of bone: Secondary | ICD-10-CM | POA: Diagnosis not present

## 2017-07-30 DIAGNOSIS — L84 Corns and callosities: Secondary | ICD-10-CM

## 2017-07-30 DIAGNOSIS — C770 Secondary and unspecified malignant neoplasm of lymph nodes of head, face and neck: Secondary | ICD-10-CM

## 2017-07-30 DIAGNOSIS — Z5112 Encounter for antineoplastic immunotherapy: Secondary | ICD-10-CM | POA: Diagnosis not present

## 2017-07-30 LAB — CBC WITH DIFFERENTIAL/PLATELET
BASOS PCT: 1 %
Basophils Absolute: 0 10*3/uL (ref 0.0–0.1)
EOS ABS: 0.3 10*3/uL (ref 0.0–0.5)
EOS PCT: 6 %
HCT: 46.5 % (ref 38.4–49.9)
HEMOGLOBIN: 15.5 g/dL (ref 13.0–17.1)
Lymphocytes Relative: 12 %
Lymphs Abs: 0.5 10*3/uL — ABNORMAL LOW (ref 0.9–3.3)
MCH: 30.4 pg (ref 27.2–33.4)
MCHC: 33.3 g/dL (ref 32.0–36.0)
MCV: 91.4 fL (ref 79.3–98.0)
MONOS PCT: 9 %
Monocytes Absolute: 0.4 10*3/uL (ref 0.1–0.9)
NEUTROS PCT: 72 %
Neutro Abs: 2.9 10*3/uL (ref 1.5–6.5)
PLATELETS: 154 10*3/uL (ref 140–400)
RBC: 5.09 MIL/uL (ref 4.20–5.82)
RDW: 14.5 % (ref 11.0–14.6)
WBC: 4 10*3/uL (ref 4.0–10.3)

## 2017-07-30 LAB — COMPREHENSIVE METABOLIC PANEL
ALBUMIN: 3.6 g/dL (ref 3.5–5.0)
ALK PHOS: 50 U/L (ref 40–150)
ALT: 15 U/L (ref 0–55)
ANION GAP: 8 (ref 3–11)
AST: 18 U/L (ref 5–34)
BUN: 31 mg/dL — ABNORMAL HIGH (ref 7–26)
CALCIUM: 9.5 mg/dL (ref 8.4–10.4)
CO2: 23 mmol/L (ref 22–29)
Chloride: 110 mmol/L — ABNORMAL HIGH (ref 98–109)
Creatinine, Ser: 1.41 mg/dL — ABNORMAL HIGH (ref 0.70–1.30)
GFR calc non Af Amer: 46 mL/min — ABNORMAL LOW (ref >60)
GFR, EST AFRICAN AMERICAN: 54 mL/min — AB (ref >60)
GLUCOSE: 128 mg/dL (ref 70–140)
Potassium: 4.5 mmol/L (ref 3.5–5.1)
Sodium: 141 mmol/L (ref 136–145)
Total Bilirubin: 0.5 mg/dL (ref 0.2–1.2)
Total Protein: 6.7 g/dL (ref 6.4–8.3)

## 2017-07-30 LAB — TSH: TSH: 2.484 u[IU]/mL (ref 0.320–4.118)

## 2017-07-30 MED ORDER — SODIUM CHLORIDE 0.9 % IV SOLN
240.0000 mg | Freq: Once | INTRAVENOUS | Status: AC
  Administered 2017-07-30: 240 mg via INTRAVENOUS
  Filled 2017-07-30: qty 24

## 2017-07-30 MED ORDER — FLUOCINONIDE 0.05 % EX CREA: TOPICAL_CREAM | Freq: Two times a day (BID) | CUTANEOUS | 1 refills | Status: DC | PRN

## 2017-07-30 MED ORDER — DENOSUMAB 120 MG/1.7ML ~~LOC~~ SOLN
SUBCUTANEOUS | Status: AC
  Filled 2017-07-30: qty 1.7

## 2017-07-30 MED ORDER — DENOSUMAB 120 MG/1.7ML ~~LOC~~ SOLN
120.0000 mg | Freq: Once | SUBCUTANEOUS | Status: AC
  Administered 2017-07-30: 120 mg via SUBCUTANEOUS

## 2017-07-30 MED ORDER — SODIUM CHLORIDE 0.9 % IV SOLN
Freq: Once | INTRAVENOUS | Status: AC
  Administered 2017-07-30: 12:00:00 via INTRAVENOUS

## 2017-07-30 NOTE — Patient Instructions (Signed)
[facility] Discharge Instructions for Patients Receiving Chemotherapy  Today you received the following chemotherapy agents:  Nivolumab  To help prevent nausea and vomiting after your treatment, we encourage you to take your nausea medication as prescribed.   If you develop nausea and vomiting that is not controlled by your nausea medication, call the clinic.   BELOW ARE SYMPTOMS THAT SHOULD BE REPORTED IMMEDIATELY:  *FEVER GREATER THAN 100.5 F  *CHILLS WITH OR WITHOUT FEVER  NAUSEA AND VOMITING THAT IS NOT CONTROLLED WITH YOUR NAUSEA MEDICATION  *UNUSUAL SHORTNESS OF BREATH  *UNUSUAL BRUISING OR BLEEDING  TENDERNESS IN MOUTH AND THROAT WITH OR WITHOUT PRESENCE OF ULCERS  *URINARY PROBLEMS  *BOWEL PROBLEMS  UNUSUAL RASH Items with * indicate a potential emergency and should be followed up as soon as possible.  Feel free to call the clinic should you have any questions or concerns. The clinic phone number is [phone].  Please show the CHEMO ALERT CARD at check-in to the Emergency Department and triage nurse.   

## 2017-07-31 ENCOUNTER — Telehealth: Payer: Self-pay | Admitting: Hematology

## 2017-07-31 NOTE — Telephone Encounter (Signed)
Appointments scheduled per 4/22 los/ pt not present

## 2017-08-09 ENCOUNTER — Encounter: Payer: Self-pay | Admitting: Podiatry

## 2017-08-09 ENCOUNTER — Ambulatory Visit (INDEPENDENT_AMBULATORY_CARE_PROVIDER_SITE_OTHER): Payer: Medicare Other | Admitting: Podiatry

## 2017-08-09 DIAGNOSIS — L84 Corns and callosities: Secondary | ICD-10-CM

## 2017-08-09 DIAGNOSIS — Q828 Other specified congenital malformations of skin: Secondary | ICD-10-CM | POA: Diagnosis not present

## 2017-08-09 DIAGNOSIS — L97511 Non-pressure chronic ulcer of other part of right foot limited to breakdown of skin: Secondary | ICD-10-CM

## 2017-08-09 NOTE — Progress Notes (Signed)
This patient presents the office follow-up for a previously  Treated under his big toe joint right foot.  He says that the callus that has developed is painful walking.  He has been wearing his insoles in his shoes.  He denies any drainage from this painful skin lesion.David [name]  He presents the office today for continued evaluation and treatment of this skin lesion   General Appearance  Alert, conversant and in no acute stress.  Vascular  Dorsalis pedis and posterior pulses are palpable  bilaterally.  Capillary return is within normal limits  bilaterally. Temperature is within normal limits  Bilaterally.  Neurologic  [name]-Weinstein monofilament wire test within normal limits  bilaterally. Muscle power within normal limits bilaterally.  Nails Normotropic nails both feet.  Orthopedic  No limitations of motion of motion feet bilaterally.  No crepitus or effusions noted.  DJD 1st MPJ  B/L. Plantarflexed first metatarsal right foot.  Skin  Porokeratosis sub 1 right foot. There is a hemorrhagic callus  noted in the center of this hyperkeratotic lesion.  No evidence of any fluctuance or drainage noted     No signs of infections or ulcers noted.   Porokeratosis/Skin ulcer 1st MPJ left foot.    Debridement of hyperkeratotic tissue was performed right foot using a #15 blade.   He is to return to the office in 6 weeks for further evaluation and treatment.  Continue using orthoses.  RTC 6 weeks.   Gardiner Barefoot DPM

## 2017-08-13 ENCOUNTER — Inpatient Hospital Stay: Payer: Medicare Other | Attending: Hematology

## 2017-08-13 ENCOUNTER — Inpatient Hospital Stay: Payer: Medicare Other

## 2017-08-13 VITALS — BP 125/91 | HR 76 | Temp 97.8°F | Resp 18

## 2017-08-13 DIAGNOSIS — I10 Essential (primary) hypertension: Secondary | ICD-10-CM

## 2017-08-13 DIAGNOSIS — C439 Malignant melanoma of skin, unspecified: Secondary | ICD-10-CM | POA: Diagnosis not present

## 2017-08-13 DIAGNOSIS — I619 Nontraumatic intracerebral hemorrhage, unspecified: Secondary | ICD-10-CM

## 2017-08-13 DIAGNOSIS — Z79899 Other long term (current) drug therapy: Secondary | ICD-10-CM | POA: Diagnosis not present

## 2017-08-13 DIAGNOSIS — Z5112 Encounter for antineoplastic immunotherapy: Secondary | ICD-10-CM | POA: Diagnosis not present

## 2017-08-13 DIAGNOSIS — C7951 Secondary malignant neoplasm of bone: Secondary | ICD-10-CM | POA: Insufficient documentation

## 2017-08-13 DIAGNOSIS — C7931 Secondary malignant neoplasm of brain: Secondary | ICD-10-CM | POA: Insufficient documentation

## 2017-08-13 DIAGNOSIS — C770 Secondary and unspecified malignant neoplasm of lymph nodes of head, face and neck: Secondary | ICD-10-CM | POA: Insufficient documentation

## 2017-08-13 DIAGNOSIS — C799 Secondary malignant neoplasm of unspecified site: Secondary | ICD-10-CM

## 2017-08-13 DIAGNOSIS — C7800 Secondary malignant neoplasm of unspecified lung: Secondary | ICD-10-CM | POA: Diagnosis not present

## 2017-08-13 LAB — CBC WITH DIFFERENTIAL/PLATELET
BASOS ABS: 0 10*3/uL (ref 0.0–0.1)
BASOS PCT: 0 %
EOS ABS: 0.2 10*3/uL (ref 0.0–0.5)
Eosinophils Relative: 4 %
HCT: 45 % (ref 38.4–49.9)
Hemoglobin: 15.2 g/dL (ref 13.0–17.1)
Lymphocytes Relative: 11 %
Lymphs Abs: 0.5 10*3/uL — ABNORMAL LOW (ref 0.9–3.3)
MCH: 30.8 pg (ref 27.2–33.4)
MCHC: 33.8 g/dL (ref 32.0–36.0)
MCV: 91.3 fL (ref 79.3–98.0)
MONO ABS: 0.5 10*3/uL (ref 0.1–0.9)
Monocytes Relative: 10 %
Neutro Abs: 3.3 10*3/uL (ref 1.5–6.5)
Neutrophils Relative %: 75 %
Platelets: 147 10*3/uL (ref 140–400)
RBC: 4.93 MIL/uL (ref 4.20–5.82)
RDW: 14.2 % (ref 11.0–14.6)
WBC: 4.5 10*3/uL (ref 4.0–10.3)

## 2017-08-13 LAB — COMPREHENSIVE METABOLIC PANEL
ALK PHOS: 58 U/L (ref 40–150)
ALT: 20 U/L (ref 0–55)
AST: 28 U/L (ref 5–34)
Albumin: 3.8 g/dL (ref 3.5–5.0)
Anion gap: 6 (ref 3–11)
BILIRUBIN TOTAL: 0.7 mg/dL (ref 0.2–1.2)
BUN: 27 mg/dL — ABNORMAL HIGH (ref 7–26)
CALCIUM: 9.3 mg/dL (ref 8.4–10.4)
CO2: 23 mmol/L (ref 22–29)
CREATININE: 1.4 mg/dL — AB (ref 0.70–1.30)
Chloride: 107 mmol/L (ref 98–109)
GFR calc Af Amer: 54 mL/min — ABNORMAL LOW (ref >60)
GFR, EST NON AFRICAN AMERICAN: 47 mL/min — AB (ref >60)
GLUCOSE: 119 mg/dL (ref 70–140)
POTASSIUM: 5.4 mmol/L — AB (ref 3.5–5.1)
Sodium: 136 mmol/L (ref 136–145)
TOTAL PROTEIN: 6.9 g/dL (ref 6.4–8.3)

## 2017-08-13 LAB — TSH: TSH: 2.02 u[IU]/mL (ref 0.320–4.118)

## 2017-08-13 MED ORDER — SODIUM CHLORIDE 0.9 % IV SOLN
Freq: Once | INTRAVENOUS | Status: AC
  Administered 2017-08-13: 14:00:00 via INTRAVENOUS

## 2017-08-13 MED ORDER — NIVOLUMAB CHEMO INJECTION 100 MG/10ML
240.0000 mg | Freq: Once | INTRAVENOUS | Status: AC
  Administered 2017-08-13: 240 mg via INTRAVENOUS
  Filled 2017-08-13: qty 24

## 2017-08-13 NOTE — Patient Instructions (Signed)
[facility] Discharge Instructions for Patients Receiving Chemotherapy  Today you received the following chemotherapy agents Opdivo  To help prevent nausea and vomiting after your treatment, we encourage you to take your nausea medication as directed   If you develop nausea and vomiting that is not controlled by your nausea medication, call the clinic.   BELOW ARE SYMPTOMS THAT SHOULD BE REPORTED IMMEDIATELY:  *FEVER GREATER THAN 100.5 F  *CHILLS WITH OR WITHOUT FEVER  NAUSEA AND VOMITING THAT IS NOT CONTROLLED WITH YOUR NAUSEA MEDICATION  *UNUSUAL SHORTNESS OF BREATH  *UNUSUAL BRUISING OR BLEEDING  TENDERNESS IN MOUTH AND THROAT WITH OR WITHOUT PRESENCE OF ULCERS  *URINARY PROBLEMS  *BOWEL PROBLEMS  UNUSUAL RASH Items with * indicate a potential emergency and should be followed up as soon as possible.  Feel free to call the clinic should you have any questions or concerns. The clinic phone number is [phone].  Please show the CHEMO ALERT CARD at check-in to the Emergency Department and triage nurse.   

## 2017-08-15 ENCOUNTER — Encounter: Payer: Self-pay | Admitting: Internal Medicine

## 2017-08-16 ENCOUNTER — Ambulatory Visit: Payer: Medicare Other | Admitting: Hematology

## 2017-08-16 ENCOUNTER — Other Ambulatory Visit: Payer: Medicare Other

## 2017-08-16 ENCOUNTER — Ambulatory Visit: Payer: Medicare Other

## 2017-08-23 ENCOUNTER — Encounter: Payer: Self-pay | Admitting: Family Medicine

## 2017-08-23 ENCOUNTER — Ambulatory Visit (INDEPENDENT_AMBULATORY_CARE_PROVIDER_SITE_OTHER): Payer: Medicare Other | Admitting: Family Medicine

## 2017-08-23 VITALS — BP 120/78 | HR 77 | Temp 98.2°F | Ht 69.0 in | Wt 204.5 lb

## 2017-08-23 DIAGNOSIS — I255 Ischemic cardiomyopathy: Secondary | ICD-10-CM | POA: Diagnosis not present

## 2017-08-23 DIAGNOSIS — C7931 Secondary malignant neoplasm of brain: Secondary | ICD-10-CM

## 2017-08-23 DIAGNOSIS — Z95810 Presence of automatic (implantable) cardiac defibrillator: Secondary | ICD-10-CM

## 2017-08-23 DIAGNOSIS — E349 Endocrine disorder, unspecified: Secondary | ICD-10-CM

## 2017-08-23 DIAGNOSIS — Z9989 Dependence on other enabling machines and devices: Secondary | ICD-10-CM | POA: Diagnosis not present

## 2017-08-23 DIAGNOSIS — N183 Chronic kidney disease, stage 3 unspecified: Secondary | ICD-10-CM

## 2017-08-23 DIAGNOSIS — E782 Mixed hyperlipidemia: Secondary | ICD-10-CM

## 2017-08-23 DIAGNOSIS — I10 Essential (primary) hypertension: Secondary | ICD-10-CM

## 2017-08-23 DIAGNOSIS — G4733 Obstructive sleep apnea (adult) (pediatric): Secondary | ICD-10-CM

## 2017-08-23 DIAGNOSIS — R4189 Other symptoms and signs involving cognitive functions and awareness: Secondary | ICD-10-CM | POA: Diagnosis not present

## 2017-08-23 DIAGNOSIS — E039 Hypothyroidism, unspecified: Secondary | ICD-10-CM | POA: Diagnosis not present

## 2017-08-23 DIAGNOSIS — C439 Malignant melanoma of skin, unspecified: Secondary | ICD-10-CM | POA: Diagnosis not present

## 2017-08-23 NOTE — Progress Notes (Signed)
BP 120/78 (BP Location: Left Arm, Patient Position: Sitting, Cuff Size: Normal)   Pulse 77   Temp 98.2 F (36.8 C) (Oral)   Ht 5\' 9"  (1.753 m)   Wt 204 lb 8 oz (92.8 kg)   SpO2 94%   BMI 30.20 kg/m    CC: 3 mo f/u visit Subjective:    Patient ID: David [name]., male    DOB: April 12, 1940, 77 y.o.   MRN: [id number]  HPI: David [name]. is a 77 y.o. male presenting on 08/23/2017 for 3 mo follow up   Feels well today.   HTN - Compliant with current antihypertensive regimen of amlodipine 5mg , toprol XL 75mg  daily. Does not regularly check blood pressures at home: well controlled when infrequently checked.  No low blood pressure readings or symptoms of dizziness/syncope. Denies HA, vision changes, CP/tightness, SOB, leg swelling.    Established with [name] cardiology, note reviewed. Referred to [name] for BiV ICD. Upcoming appt for this. Crestor was increased to 20mg  daily - tolerating this without myalgias.   Known metastatic melanoma to brain s/p brain surgery and radiation with residual cognitive impairment sees [name] and [name] neuro oncologist. [name] response to nivolumab Q2 weeks plan to continue through 12/2017 to complete 2 yrs.   Saw OT driving rehab therapist for driving evaluation in [place] - thought ok to drive, with some restrictions. Planning to go to [facility] next month to further pursue this.  Continues testosterone injection every 2 weeks. Doing well on this regimen, feels energy levels stable.   Relevant past medical, surgical, family and social history reviewed and updated as indicated. Interim medical history since our last visit reviewed. Allergies and medications reviewed and updated. Outpatient Medications Prior to Visit  Medication Sig Dispense Refill  . acetaminophen (TYLENOL) 500 MG tablet Take 500 mg by mouth at bedtime.    Marland Kitchen amLODipine (NORVASC) 5 MG tablet Take 1 tablet (5 mg total) by mouth daily. 90 tablet 3  . aspirin EC 81 MG  tablet Take 81 mg by mouth daily.    . fluocinonide cream (LIDEX) 0.05 % Apply topically 2 (two) times daily as needed. 30 g 1  . levothyroxine (SYNTHROID, LEVOTHROID) 137 MCG tablet Take 1 tablet (137 mcg total) by mouth daily before breakfast. 30 tablet 3  . metoprolol succinate (TOPROL-XL) 50 MG 24 hr tablet Take 1.5 tablets daily (75mg ). Take with or immediately following a meal. 135 tablet 3  . pentoxifylline (TRENTAL) 400 MG CR tablet Take 1 tablet (400 mg total) by mouth 2 (two) times daily. 60 tablet 6  . rosuvastatin (CRESTOR) 20 MG tablet Take 1 tablet (20 mg total) by mouth at bedtime. 90 tablet 3  . sertraline (ZOLOFT) 100 MG tablet     . testosterone cypionate (DEPOTESTOTERONE CYPIONATE) 100 MG/ML injection Inject 100 mg into the muscle every 14 (fourteen) days.     . Turmeric 500 MG CAPS Take 2 capsules by mouth daily.    . vitamin E (VITAMIN E) 400 UNIT capsule Take 1 capsule (400 Units total) by mouth 2 (two) times daily. 60 capsule 6   Facility-Administered Medications Prior to Visit  Medication Dose Route Frequency Provider Last Rate Last Dose  . 0.9 %  sodium chloride infusion  1,000 mL Intravenous Once [name], MD         Per HPI unless specifically indicated in ROS section below Review of Systems     Objective:    BP 120/78 (  BP Location: Left Arm, Patient Position: Sitting, Cuff Size: Normal)   Pulse 77   Temp 98.2 F (36.8 C) (Oral)   Ht 5\' 9"  (1.753 m)   Wt 204 lb 8 oz (92.8 kg)   SpO2 94%   BMI 30.20 kg/m   Wt Readings from Last 3 Encounters:  08/23/17 204 lb 8 oz (92.8 kg)  07/30/17 203 lb 6.4 oz (92.3 kg)  07/17/17 201 lb 12 oz (91.5 kg)    Physical Exam  Constitutional: He appears well-developed and well-nourished. No distress.  HENT:  Mouth/Throat: Oropharynx is clear and moist. No oropharyngeal exudate.  Cardiovascular: Normal rate, regular rhythm and normal heart sounds.  No murmur heard. Pulmonary/Chest: Effort normal and breath sounds  normal. No respiratory distress. He has no wheezes. He has no rales.  Musculoskeletal: He exhibits no edema.  Nursing note and vitals reviewed.  Results for orders placed or performed in visit on 08/13/17  TSH  Result Value Ref Range   TSH 2.020 0.320 - 4.118 uIU/mL  Comprehensive metabolic panel  Result Value Ref Range   Sodium 136 136 - 145 mmol/L   Potassium 5.4 (H) 3.5 - 5.1 mmol/L   Chloride 107 98 - 109 mmol/L   CO2 23 22 - 29 mmol/L   Glucose, Bld 119 70 - 140 mg/dL   BUN 27 (H) 7 - 26 mg/dL   Creatinine, Ser 1.40 (H) 0.70 - 1.30 mg/dL   Calcium 9.3 8.4 - 10.4 mg/dL   Total Protein 6.9 6.4 - 8.3 g/dL   Albumin 3.8 3.5 - 5.0 g/dL   AST 28 5 - 34 U/L   ALT 20 0 - 55 U/L   Alkaline Phosphatase 58 40 - 150 U/L   Total Bilirubin 0.7 0.2 - 1.2 mg/dL   GFR calc non Af Amer 47 (L) >60 mL/min   GFR calc Af Amer 54 (L) >60 mL/min   Anion gap 6 3 - 11  CBC with Differential  Result Value Ref Range   WBC 4.5 4.0 - 10.3 K/uL   RBC 4.93 4.20 - 5.82 MIL/uL   Hemoglobin 15.2 13.0 - 17.1 g/dL   HCT 45.0 38.4 - 49.9 %   MCV 91.3 79.3 - 98.0 fL   MCH 30.8 27.2 - 33.4 pg   MCHC 33.8 32.0 - 36.0 g/dL   RDW 14.2 11.0 - 14.6 %   Platelets 147 140 - 400 K/uL   Neutrophils Relative % 75 %   Neutro Abs 3.3 1.5 - 6.5 K/uL   Lymphocytes Relative 11 %   Lymphs Abs 0.5 (L) 0.9 - 3.3 K/uL   Monocytes Relative 10 %   Monocytes Absolute 0.5 0.1 - 0.9 K/uL   Eosinophils Relative 4 %   Eosinophils Absolute 0.2 0.0 - 0.5 K/uL   Basophils Relative 0 %   Basophils Absolute 0.0 0.0 - 0.1 K/uL      Assessment & Plan:   Problem List Items Addressed This Visit    Acquired hypothyroidism    TSH at goal - continue levothyroxine 149mcg daily.       Benign essential HTN - Primary    Chronic, stable. Continue current regimen.       Biventricular ICD (implantable cardioverter-defibrillator) in place    To establish with EP.       Chronic kidney disease, stage III (moderate) (HCC)    Chronic  over last few years, stable. Reviewed stable trend with patient.       Cognitive impairment  Residual post stroke. Followed by neuro-onc. He has had evaluation by driver rehab occupational therapy with recommendation for limited driving with restrictions - he is planning to [place] pursue this through the [facility]. Daughter sent me a message regarding possible use of stimulants for noted inattention with driving on exam. I don't recommend additional stimulant at this time - will touch base with daughter.       Hyperlipidemia    Chronic, crestor dose has been increased, he is tolerating this well. Continue.       Hypotestosteronemia    Reviewed with patient. Stable based on latest labwork and overall feels well - will continue this. Will continue to monitor PSA given testosterone use, pt not prone to undergo further evaluation.       Malignant melanoma ([place])    Metastatic malignant melanoma to lungs, skull, brain, lymph nodes. Followed by onc and neuro-onc, appreciate their care. He seems to be responding well to biweekly immunotherapy nivolumab.       Metastasis to brain (HCC)   OSA on CPAP    Established with Dr Isidore Moos pulmonology. Now with new CPAP machine          No orders of the defined types were placed in this encounter.  No orders of the defined types were placed in this encounter.   Follow up plan: Return in about 4 months (around 12/24/2017) for medicare wellness visit, follow up visit.  [name], MD

## 2017-08-23 NOTE — Assessment & Plan Note (Addendum)
Metastatic malignant melanoma to lungs, skull, brain, lymph nodes. Followed by onc and neuro-onc, appreciate their care. He seems to be responding well to biweekly immunotherapy nivolumab.

## 2017-08-23 NOTE — Assessment & Plan Note (Addendum)
Residual post stroke. Followed by neuro-onc. He has had evaluation by driver rehab occupational therapy with recommendation for limited driving with restrictions - he is planning to [place] pursue this through the [facility]. Daughter sent me a message regarding possible use of stimulants for noted inattention with driving on exam. I don't recommend additional stimulant at this time - will touch base with daughter.

## 2017-08-23 NOTE — Assessment & Plan Note (Addendum)
Established with Dr Isidore Moos pulmonology. Now with new CPAP machine

## 2017-08-23 NOTE — Assessment & Plan Note (Signed)
To establish with EP.

## 2017-08-23 NOTE — Patient Instructions (Signed)
You are doing well today. Continue current medicines Return as needed or in 4 months for medicare wellness visit and follow up. We will check labs at that time (8am blood work).

## 2017-08-23 NOTE — Assessment & Plan Note (Signed)
Chronic, stable. Continue current regimen. 

## 2017-08-23 NOTE — Assessment & Plan Note (Signed)
Chronic, crestor dose has been increased, he is tolerating this well. Continue.

## 2017-08-23 NOTE — Assessment & Plan Note (Signed)
TSH at goal - continue levothyroxine 147mcg daily.

## 2017-08-23 NOTE — Assessment & Plan Note (Addendum)
Reviewed with patient. Stable based on latest labwork and overall feels well - will continue this. Will continue to monitor PSA given testosterone use, pt not prone to undergo further evaluation.

## 2017-08-23 NOTE — Assessment & Plan Note (Signed)
Chronic over last few years, stable. Reviewed stable trend with patient.

## 2017-08-27 ENCOUNTER — Inpatient Hospital Stay: Payer: Medicare Other

## 2017-08-27 ENCOUNTER — Telehealth: Payer: Self-pay

## 2017-08-27 ENCOUNTER — Encounter: Payer: Self-pay | Admitting: Hematology

## 2017-08-27 ENCOUNTER — Inpatient Hospital Stay (HOSPITAL_BASED_OUTPATIENT_CLINIC_OR_DEPARTMENT_OTHER): Payer: Medicare Other | Admitting: Hematology

## 2017-08-27 VITALS — BP 124/85 | HR 77 | Temp 97.7°F | Resp 20 | Ht 69.0 in | Wt 203.8 lb

## 2017-08-27 DIAGNOSIS — R21 Rash and other nonspecific skin eruption: Secondary | ICD-10-CM | POA: Diagnosis not present

## 2017-08-27 DIAGNOSIS — I10 Essential (primary) hypertension: Secondary | ICD-10-CM

## 2017-08-27 DIAGNOSIS — F413 Other mixed anxiety disorders: Secondary | ICD-10-CM

## 2017-08-27 DIAGNOSIS — C7800 Secondary malignant neoplasm of unspecified lung: Secondary | ICD-10-CM

## 2017-08-27 DIAGNOSIS — F329 Major depressive disorder, single episode, unspecified: Secondary | ICD-10-CM

## 2017-08-27 DIAGNOSIS — C770 Secondary and unspecified malignant neoplasm of lymph nodes of head, face and neck: Secondary | ICD-10-CM

## 2017-08-27 DIAGNOSIS — C7931 Secondary malignant neoplasm of brain: Secondary | ICD-10-CM

## 2017-08-27 DIAGNOSIS — E039 Hypothyroidism, unspecified: Secondary | ICD-10-CM

## 2017-08-27 DIAGNOSIS — N183 Chronic kidney disease, stage 3 (moderate): Secondary | ICD-10-CM

## 2017-08-27 DIAGNOSIS — E291 Testicular hypofunction: Secondary | ICD-10-CM | POA: Diagnosis not present

## 2017-08-27 DIAGNOSIS — I619 Nontraumatic intracerebral hemorrhage, unspecified: Secondary | ICD-10-CM

## 2017-08-27 DIAGNOSIS — C439 Malignant melanoma of skin, unspecified: Secondary | ICD-10-CM | POA: Diagnosis not present

## 2017-08-27 DIAGNOSIS — C7951 Secondary malignant neoplasm of bone: Secondary | ICD-10-CM

## 2017-08-27 DIAGNOSIS — Z5112 Encounter for antineoplastic immunotherapy: Secondary | ICD-10-CM | POA: Diagnosis not present

## 2017-08-27 DIAGNOSIS — R5383 Other fatigue: Secondary | ICD-10-CM

## 2017-08-27 DIAGNOSIS — C799 Secondary malignant neoplasm of unspecified site: Secondary | ICD-10-CM

## 2017-08-27 DIAGNOSIS — I129 Hypertensive chronic kidney disease with stage 1 through stage 4 chronic kidney disease, or unspecified chronic kidney disease: Secondary | ICD-10-CM | POA: Diagnosis not present

## 2017-08-27 DIAGNOSIS — I25810 Atherosclerosis of coronary artery bypass graft(s) without angina pectoris: Secondary | ICD-10-CM | POA: Diagnosis not present

## 2017-08-27 DIAGNOSIS — Z8673 Personal history of transient ischemic attack (TIA), and cerebral infarction without residual deficits: Secondary | ICD-10-CM

## 2017-08-27 DIAGNOSIS — Z950 Presence of cardiac pacemaker: Secondary | ICD-10-CM

## 2017-08-27 LAB — CBC WITH DIFFERENTIAL/PLATELET
BASOS ABS: 0 10*3/uL (ref 0.0–0.1)
BASOS PCT: 0 %
EOS ABS: 0.3 10*3/uL (ref 0.0–0.5)
EOS PCT: 5 %
HCT: 44.9 % (ref 38.4–49.9)
Hemoglobin: 15.1 g/dL (ref 13.0–17.1)
Lymphocytes Relative: 14 %
Lymphs Abs: 0.7 10*3/uL — ABNORMAL LOW (ref 0.9–3.3)
MCH: 30.8 pg (ref 27.2–33.4)
MCHC: 33.6 g/dL (ref 32.0–36.0)
MCV: 91.6 fL (ref 79.3–98.0)
Monocytes Absolute: 0.5 10*3/uL (ref 0.1–0.9)
Monocytes Relative: 9 %
Neutro Abs: 3.6 10*3/uL (ref 1.5–6.5)
Neutrophils Relative %: 72 %
PLATELETS: 158 10*3/uL (ref 140–400)
RBC: 4.9 MIL/uL (ref 4.20–5.82)
RDW: 14.1 % (ref 11.0–14.6)
WBC: 5 10*3/uL (ref 4.0–10.3)

## 2017-08-27 LAB — COMPREHENSIVE METABOLIC PANEL
ALT: 19 U/L (ref 0–55)
AST: 22 U/L (ref 5–34)
Albumin: 4 g/dL (ref 3.5–5.0)
Alkaline Phosphatase: 54 U/L (ref 40–150)
Anion gap: 8 (ref 3–11)
BILIRUBIN TOTAL: 0.6 mg/dL (ref 0.2–1.2)
BUN: 31 mg/dL — AB (ref 7–26)
CO2: 23 mmol/L (ref 22–29)
CREATININE: 1.36 mg/dL — AB (ref 0.70–1.30)
Calcium: 9.2 mg/dL (ref 8.4–10.4)
Chloride: 107 mmol/L (ref 98–109)
GFR calc Af Amer: 56 mL/min — ABNORMAL LOW (ref >60)
GFR calc non Af Amer: 48 mL/min — ABNORMAL LOW (ref >60)
Glucose, Bld: 103 mg/dL (ref 70–140)
POTASSIUM: 5.2 mmol/L — AB (ref 3.5–5.1)
Sodium: 138 mmol/L (ref 136–145)
TOTAL PROTEIN: 7 g/dL (ref 6.4–8.3)

## 2017-08-27 LAB — TSH: TSH: 1.286 u[IU]/mL (ref 0.320–4.118)

## 2017-08-27 MED ORDER — DENOSUMAB 120 MG/1.7ML ~~LOC~~ SOLN
SUBCUTANEOUS | Status: AC
  Filled 2017-08-27: qty 1.7

## 2017-08-27 MED ORDER — DENOSUMAB 120 MG/1.7ML ~~LOC~~ SOLN
120.0000 mg | Freq: Once | SUBCUTANEOUS | Status: AC
  Administered 2017-08-27: 120 mg via SUBCUTANEOUS

## 2017-08-27 MED ORDER — SODIUM CHLORIDE 0.9 % IV SOLN
240.0000 mg | Freq: Once | INTRAVENOUS | Status: AC
  Administered 2017-08-27: 240 mg via INTRAVENOUS
  Filled 2017-08-27: qty 24

## 2017-08-27 MED ORDER — SODIUM CHLORIDE 0.9 % IV SOLN
Freq: Once | INTRAVENOUS | Status: AC
  Administered 2017-08-27: 14:00:00 via INTRAVENOUS

## 2017-08-27 NOTE — Patient Instructions (Addendum)
[facility] Discharge Instructions for Patients Receiving Chemotherapy  Today you received the following chemotherapy agents: Opdivo.  To help prevent nausea and vomiting after your treatment, we encourage you to take your nausea medication as directed   If you develop nausea and vomiting that is not controlled by your nausea medication, call the clinic.   BELOW ARE SYMPTOMS THAT SHOULD BE REPORTED IMMEDIATELY:  *FEVER GREATER THAN 100.5 F  *CHILLS WITH OR WITHOUT FEVER  NAUSEA AND VOMITING THAT IS NOT CONTROLLED WITH YOUR NAUSEA MEDICATION  *UNUSUAL SHORTNESS OF BREATH  *UNUSUAL BRUISING OR BLEEDING  TENDERNESS IN MOUTH AND THROAT WITH OR WITHOUT PRESENCE OF ULCERS  *URINARY PROBLEMS  *BOWEL PROBLEMS  UNUSUAL RASH Items with * indicate a potential emergency and should be followed up as soon as possible.  Feel free to call the clinic should you have any questions or concerns. The clinic phone number is [phone].  Please show the [place] at check-in to the Emergency Department and triage nurse.  Denosumab injection What is this medicine? DENOSUMAB (den oh sue mab) slows bone breakdown. Prolia is used to treat osteoporosis in women after menopause and in men. Delton See is used to treat a high calcium level due to cancer and to prevent bone fractures and other bone problems caused by multiple myeloma or cancer bone metastases. Delton See is also used to treat giant cell tumor of the bone. This medicine may be used for other purposes; ask your health care provider or pharmacist if you have questions. COMMON BRAND NAME(S): Prolia, XGEVA What should I tell my health care provider before I take this medicine? They need to know if you have any of these conditions: -dental disease -having surgery or tooth extraction -infection -kidney disease -low levels of calcium or Vitamin D in the blood -malnutrition -on hemodialysis -skin conditions or  sensitivity -thyroid or parathyroid disease -an unusual reaction to denosumab, other medicines, foods, dyes, or preservatives -pregnant or trying to get pregnant -breast-feeding How should I use this medicine? This medicine is for injection under the skin. It is given by a health care professional in a hospital or clinic setting. If you are getting Prolia, a special MedGuide will be given to you by the pharmacist with each prescription and refill. Be sure to read this information carefully each time. For Prolia, talk to your pediatrician regarding the use of this medicine in children. Special care may be needed. For Delton See, talk to your pediatrician regarding the use of this medicine in children. While this drug may be prescribed for children as young as 13 years for selected conditions, precautions do apply. Overdosage: If you think you have taken too much of this medicine contact a poison control center or emergency room at once. NOTE: This medicine is only for you. Do not share this medicine with others. What if I miss a dose? It is important not to miss your dose. Call your doctor or health care professional if you are unable to keep an appointment. What may interact with this medicine? Do not take this medicine with any of the following medications: -other medicines containing denosumab This medicine may also interact with the following medications: -medicines that lower your chance of fighting infection -steroid medicines like prednisone or cortisone This list may not describe all possible interactions. Give your health care provider a list of all the medicines, herbs, non-prescription drugs, or dietary supplements you use. Also tell them if you smoke, drink alcohol, or use illegal  drugs. Some items may interact with your medicine. What should I watch for while using this medicine? Visit your doctor or health care professional for regular checks on your progress. Your doctor or health care  professional may order blood tests and other tests to see how you are doing. Call your doctor or health care professional for advice if you get a fever, chills or sore throat, or other symptoms of a cold or flu. Do not treat yourself. This drug may decrease your body's ability to fight infection. Try to avoid being around people who are sick. You should make sure you get enough calcium and vitamin D while you are taking this medicine, unless your doctor tells you not to. Discuss the foods you eat and the vitamins you take with your health care professional. See your dentist regularly. Brush and floss your teeth as directed. Before you have any dental work done, tell your dentist you are receiving this medicine. Do not become pregnant while taking this medicine or for 5 months after stopping it. Talk with your doctor or health care professional about your birth control options while taking this medicine. Women should inform their doctor if they wish to become pregnant or think they might be pregnant. There is a potential for serious side effects to an unborn child. Talk to your health care professional or pharmacist for more information. What side effects may I notice from receiving this medicine? Side effects that you should report to your doctor or health care professional as soon as possible: -allergic reactions like skin rash, itching or hives, swelling of the face, lips, or tongue -bone pain -breathing problems -dizziness -jaw pain, especially after dental work -redness, blistering, peeling of the skin -signs and symptoms of infection like fever or chills; cough; sore throat; pain or trouble passing urine -signs of low calcium like fast heartbeat, muscle cramps or muscle pain; pain, tingling, numbness in the hands or feet; seizures -unusual bleeding or bruising -unusually weak or tired Side effects that usually do not require medical attention (report to your doctor or health care professional  if they continue or are bothersome): -constipation -diarrhea -headache -joint pain -loss of appetite -muscle pain -runny nose -tiredness -upset stomach This list may not describe all possible side effects. Call your doctor for medical advice about side effects. You may report side effects to FDA at 1-800-FDA-1088. Where should I keep my medicine? This medicine is only given in a clinic, doctor's office, or other health care setting and will not be stored at home. NOTE: This sheet is a summary. It may not cover all possible information. If you have questions about this medicine, talk to your doctor, pharmacist, or health care provider.  2018 Elsevier/Gold Standard (2016-04-18 19:17:21)  

## 2017-08-27 NOTE — Progress Notes (Signed)
[place]  Telephone:(336) [phone] Fax:(336) [phone]  Clinic Follow up Note   Patient Care Team: [name], MD as PCP - General (Family Medicine)   Date of Service:  08/27/2017  Chief complaint: Follow-up metastatic melanoma   Oncology History   Metastatic melanoma [facility])   Staging form: Melanoma of the Skin, AJCC 7th Edition     Clinical stage from 09/21/2015: Stage IV [place], [place], [place]) - Signed by [name], MD on 10/09/2015       Malignant melanoma ([place])   09/16/2015 Imaging    PET scan showed a hypermetabolic 2.9UT lingular nodule, and a 90m right upper lobe lung nodule, left subclavicular nodes, and bone metastasis in the proximal right humerus and right femur.       09/21/2015 Initial Diagnosis    Metastatic melanoma ([place]      09/21/2015 Initial Biopsy    Left subclavicular lymph node biopsy showed prostatic of melanoma      09/21/2015 Miscellaneous    BRAF V600K mutation (+)       10/04/2015 Imaging    CT head with without contrast showed a 2.0 x 1.6 x 1.7 cm superficial left frontal hyperdense lesion, with postcontrast enhancement, lying within the previous hemorrhagic area, concerning for solitary melanoma metastasis      10/06/2015 - 09/06/2016 Chemotherapy    Nivolumab '240mg'$  every 2 weeks, held due to his dexamethasone for radionecrosis       10/13/2015 - 10/13/2015 Radiation Therapy    10/13/2015 Preop SRS treatment: 14 Gy  to the left frontal lesion in 1 fraction      10/15/2015 Surgery    left front craniotomy and tumor excision       10/15/2015 Pathology Results    left frontal brain mass excision showed metastatic melanoma, 1.5X1.5X0.6cm      01/13/2016 Imaging    CT head without and with contrast showed ill-defined enhancement and dural thickening of the left frontal resection cavity may represent a post treatment changes or residual neoplasm. A new 6 mm hypodensity in the right cerebellar hemisphere with uncertainty enhancement may  represent a metastasis or small brain parenchymal hemorrhage.       01/27/2016 - 01/27/2016 Radiation Therapy    01/27/16 SRS Treatment:  20 Gy in 1 fraction to a 6 mm right cerebellar brain met       03/29/2016 PET scan    PET 03/29/2016 IMPRESSION: 1. Overall there has been an interval response to therapy. The index lesions sided on previous exam it are less hypermetabolic when compared with the previous exam. 2. There is an intramuscular lesion within the right masseter which has increased FDG uptake compared with previous exam. 3. No new sites of disease identified. 4. Aortic atherosclerosis and infrarenal abdominal aortic aneurysm. Recommend followup by ultrasound in 3 years. This recommendation follows ACR consensus guidelines: White Paper of the ACR Incidental Findings Committee II on Vascular Findings. JJoellyn RuedRadiol 2013; 10:789-794      04/20/2016 Imaging    CT Head w wo contrast 1. Mild patchy enhancement at the left anterior frontal lobe treatment site (series 11, image 39) without mass effect and stable surrounding white matter hypodensity without strong evidence of acute vasogenic edema. Recommend continued CT surveillance. 2. No new metastatic disease or new intracranial abnormality identified. 3. Probable benign 14 mm left parietal bone lucent lesion, unchanged since May 2017.      05/06/2016 - 05/08/2016 Hospital Admission    Pt was admitted for  rectal bleeding for one day. Breathing spontaneously resolved, hemoglobin dropped from 15 to 10.5, did not require blood transfusion. Colonoscopy showed no active bleeding, except diverticulosis and hemorrhoid. He was discharged to home.      05/08/2016 Procedure    Colonoscopy  - Stool in the ascending colon. Fluid aspiration performed. - Diverticulosis in the entire examined colon. - Non-bleeding internal hemorrhoids. - The examination was otherwise normal on direct and retroflexion views.      05/29/2016  Imaging    PET Image Restaging IMPRESSION: 1. Stable exam with no evidence progression. 2. Photopenia in the LEFT frontal lobe at prior surgical site. 3. Stable mildly hypermetabolic RIGHT paratracheal node. This may represent a thyroid nodule. 4. Stable size of minimally metabolic lingular pulmonary nodule. 5. Stable skeletal lesion of the RIGHT iliac crest 6. No cutaneous metabolic lesion.      07/20/2016 Imaging    CT Head W WO Contrast IMPRESSION: Pronounced change in the left frontal region. Much more regional vasogenic edema. Relatively stable appearance of the abnormal enhancement extending from the lateral margin of the frontal horn of left lateral ventricle to the frontal brain surface. New region of abnormal enhancement surrounding the frontal horn of the left lateral ventricle measuring approximately 3.2 cm. The differential diagnosis is recurrent tumor versus radiation necrosis. The patient is reportedly not an MRI candidate. This area is large enough that CT perfusion could possibly make a reasonable differentiation.      09/02/2016 Imaging    CT Head 09/02/16 IMPRESSION: Somewhat mixed pattern of slight posterior progression of pleomorphic enhancement surrounding the LEFT lateral ventricle although no significant mass effect, and reduced vasogenic edema compared with the scan in April.      09/27/2016 - 11/02/2016 Chemotherapy    Start dabrafenib and trametinib 09/27/16, stopped due to severe fatigue        10/05/2016 Imaging    CT Head w & w/o contrast  IMPRESSION: 1. Decreased size of the enhancing area within the left frontal lobe treatment site. Surrounding edema has also decreased. The findings support continued evolution of radiation necrosis. 2. No new enhancing lesions.      10/18/2016 PET scan    IMPRESSION: 1. New focal hypermetabolism in the proximal sigmoid colon with associated focal colonic wall thickening and mild pericolonic fat stranding at  the site of a proximal sigmoid diverticulum, favored to represent mild acute sigmoid diverticulitis. Metastatic disease is considered less likely. Clinical correlation is necessary. Consider appropriate antibiotic therapy, as clinically warranted. Attention to this region recommended on future PET-CT follow-up. 2. Otherwise complete metabolic response with no evidence of hypermetabolic metastatic disease. Lingular pulmonary nodule is slightly decreased in size and demonstrates no significant metabolism. 3. No residual hypermetabolism in the right thyroid lobe nodule, which is stable in size. 4. Aortic Atherosclerosis (ICD10-I70.0). Stable 3.1 cm infrarenal abdominal aortic aneurysm. Aortic aneurysm NOS (ICD10-I71.9). Recommend followup by ultrasound in 3 years. This recommendation follows ACR consensus guidelines: White Paper of the ACR Incidental Findings Committee II on Vascular Findings. Joellyn Rued Radiol 2013; 29:518-841.      11/01/2016 - 11/04/2016 Hospital Admission    Patient admitted to the hospital due to hypercalcemia where he received IV fluids and pamidronate      12/13/2016 PET scan    PET 12/13/16 IMPRESSION: 1. No findings to suggest metastatic disease on today's examination. 2. Previously noted hypermetabolism in the sigmoid colon has resolved, presumably indicative of a focus of diverticulitis on the prior study. Today's study  does demonstrate relatively diffuse hypermetabolism throughout the cecum, ascending colon and proximal transverse colon where there is some mild mural thickening, favored to reflect mild chronic inflammation related to underlying diverticular disease. No definite inflammatory changes on the CT portion of the examination to strongly suggest an active acute diverticulitis at this time. No discrete lesion to suggest neoplasm. 3. Previously noted 8 mm lingular nodule is stable in size and known remains non hypermetabolic. This is nonspecific but  reassuring. Continued attention on future followup studies is recommended. 4. Aortic atherosclerosis, in addition to left main and 3 vessel coronary artery disease. Status post median sternotomy for CABG including LIMA to the LAD. In addition, there is mild fusiform aneurysmal dilatation of the infrarenal abdominal aorta (3.1 cm in diameter) as well as aneurysmal dilatation of the left common iliac      12/18/2016 -  Chemotherapy    Restarted nivolamab 230m every 2 weeks, with tapering dose dexamethasone        04/18/2017 Imaging    PET Scan IMPRESSION: 1. No hypermetabolic lesion to suggest active malignancy. 2. Stable 8 mm lingular nodule is not currently hypermetabolic but merit surveillance. Infrarenal abdominal aortic aneurysm 3.1 cm in diameter. Recommend followup by [facility] 3 years. 3. Other imaging findings of potential clinical significance: Postoperative findings in the left frontal lobe. Right renal cyst. Aortic Atherosclerosis (ICD10-I70.0). Osteoarthritis. Lumbar scoliosis. Left knee effusion. Colonic diverticulosis.        History of present illness (09/02/2015): Pt is a 77y.o. Retired dPharmacist, communitywith history of HTN, OSA, CKD, PPM who was admitted on 08/22/15 with expressive difficulty. He was found to have left frontal hemorrhage 5.7 cm x 4.5 cm with 6 mm left to right shift.During the workup for his stroke, a left upper lung mass was noticed on the CT scan. I was called to evaluate his lung mass.  Patient has aphasia, not able to communicate much. According to his daughter, Dr. LGala Romney who is a [facility] GYN [facility] he probably only smoked for very short period time during the college. He did have 3 skin melanoma, which was removed, last one was 2-3 years ago. No other cancer history.  CURRENT THERAPY:  Restarted nivolamab on 12/18/2016 and changed to every 4 weeks on 01/30/2017, changed back to every 2 weeks due to fatigue on 04/23/17  INTERVAL HISTORY:   CRayhaan [name] returns for follow-up. He presents to the clinic today by himself. He notes he feels pretty well. He notes he should exercise more. His new PCP is Dr. GDanise Mina He is still getting Testosterone injecitons at home. He notes his memeory is not good with names and numbers but he has adapted to this. He notes his living home likes to pick him up before 4pm so he would like future appointments in the morning.    On review of symptoms, pt notes his rash is 90% gone. He is no longer itching. He is still using topical steriods 1-2 times a day. He denies being in any pain, his eating and apptite is adequate.     REVIEW OF SYSTEMS:   Constitutional: Denies fevers, chills or abnormal weight loss  Eyes: Denies blurriness of vision Ears, nose, mouth, throat, and face: Denies mucositis or sore throat Respiratory: Denies cough, dyspnea or wheezes Cardiovascular: Denies palpitation, chest discomfort or lower extremity swelling Gastrointestinal:  Denies nausea, heartburn, hematochezia  Skin: Negative. Lymphatics: Denies new lymphadenopathy or easy bruising Neurological: (+) trouble processing short and long  term memory MSK: (+) Arthritis pain  Behavioral/Psych: Mood is stable, no new changes  All other systems were reviewed with the patient and are negative.  MEDICAL HISTORY:  Past Medical History:  Diagnosis Date  . Anginal pain ([place])   . Arthritis    mostly hands  . Benign familial hematuria   . BPH (benign prostatic hypertrophy)   . Brain cancer ([place])    melanoma with brain met  . Coronary artery disease   . GERD (gastroesophageal reflux disease)   . High cholesterol   . Hypertension   . Hypothyroidism   . Intracerebral hemorrhage ([place])   . Melanoma ([place])   . Metastatic melanoma to lung ([place])   . Mood disorder ([place])   . Myocardial infarction ([place])   . Pacemaker    due to syncope, 3rd degree HB (upgrade to [facility]. Jude CRT-P 02/25/13 (Dr. Uvaldo Rising)  . Rectal bleed    due to NSAIDS    . Renal disorder   . Skin cancer    melanoma  . Sleep apnea    uses CPAP  . Stroke [facility])     SURGICAL HISTORY: Past Surgical History:  Procedure Laterality Date  . APPLICATION OF CRANIAL NAVIGATION N/A 10/15/2015   Procedure: APPLICATION OF CRANIAL NAVIGATION;  Surgeon: Erline Levine, MD;  Location: [place] NEURO ORS;  Service: Neurosurgery;  Laterality: N/A;  . CARDIAC CATHETERIZATION  2013  . CARDIAC SURGERY     bypass X 2  . COLONOSCOPY WITH PROPOFOL N/A 05/08/2016   Procedure: COLONOSCOPY WITH PROPOFOL;  Surgeon: Jonathon Bellows, MD;  Location: [facility] ENDOSCOPY;  Service: Gastroenterology;  Laterality: N/A;  . CORONARY ARTERY BYPASS GRAFT  2013   LIMA-LAD, SVG-PDA 03/27/11 (Dr. Francee Gentile)  . CRANIOTOMY N/A 10/15/2015   Procedure: LEFT FRONTAL CRANIOTOMY TUMOR EXCISION with Curve;  Surgeon: Erline Levine, MD;  Location: [place] NEURO ORS;  Service: Neurosurgery;  Laterality: N/A;  CRANIOTOMY TUMOR EXCISION  . EYE SURGERY    . FOOT SURGERY  08/2010  . JOINT REPLACEMENT Left    partial knee  . KNEE ARTHROSCOPY  12/2008  . LYMPH NODE BIOPSY    . PACEMAKER INSERTION    . TONSILLECTOMY      I have reviewed the social history and family history with the patient and they are unchanged from previous note.  ALLERGIES:  is allergic to ezetimibe and simvastatin.  MEDICATIONS:  Current Outpatient Medications  Medication Sig Dispense Refill  . acetaminophen (TYLENOL) 500 MG tablet Take 500 mg by mouth at bedtime.    Marland Kitchen amLODipine (NORVASC) 5 MG tablet Take 1 tablet (5 mg total) by mouth daily. 90 tablet 3  . aspirin EC 81 MG tablet Take 81 mg by mouth daily.    . fluocinonide cream (LIDEX) 0.05 % Apply topically 2 (two) times daily as needed. 30 g 1  . levothyroxine (SYNTHROID, LEVOTHROID) 137 MCG tablet Take 1 tablet (137 mcg total) by mouth daily before breakfast. 30 tablet 3  . metoprolol succinate (TOPROL-XL) 50 MG 24 hr tablet Take 1.5 tablets daily ('75mg'$ ). Take with or immediately following a meal. 135  tablet 3  . pentoxifylline (TRENTAL) 400 MG CR tablet Take 1 tablet (400 mg total) by mouth 2 (two) times daily. 60 tablet 6  . rosuvastatin (CRESTOR) 20 MG tablet Take 1 tablet (20 mg total) by mouth at bedtime. 90 tablet 3  . sertraline (ZOLOFT) 100 MG tablet     . testosterone cypionate (DEPOTESTOTERONE CYPIONATE) 100 MG/ML injection Inject 100 mg into the  muscle every 14 (fourteen) days.     . Turmeric 500 MG CAPS Take 2 capsules by mouth daily.    . vitamin E (VITAMIN E) 400 UNIT capsule Take 1 capsule (400 Units total) by mouth 2 (two) times daily. 60 capsule 6   No current facility-administered medications for this visit.    Facility-Administered Medications Ordered in Other Visits  Medication Dose Route Frequency Provider Last Rate Last Dose  . 0.9 %  sodium chloride infusion  1,000 mL Intravenous Once [name], MD        PHYSICAL EXAMINATION:  ECOG PERFORMANCE STATUS: 2 BP 124/85 (BP Location: Right Arm, Patient Position: Sitting)   Pulse 77   Temp 97.7 F (36.5 C) (Oral)   Resp 20   Ht _0  (1.753 m)   Wt 203 lb 12.8 oz (92.4 kg)   SpO2 95%   BMI 30.10 kg/m    GENERAL:alert, no distress and comfortable SKIN: Significant skin thickening below his right big toe with mild ecchymosis. Other skin color, texture, turgor are normal.  EYES: normal, Conjunctiva are pink and non-injected, sclera clear OROPHARYNX:no exudate, no erythema and lips, buccal mucosa, and tongue normal  NECK: supple, thyroid normal size, non-tender, without nodularity LYMPH:  no palpable lymphadenopathy in the cervical, Amanda, axillary or inguinal LUNGS: clear to auscultation and percussion with normal breathing effort HEART: regular rate & rhythm and no murmurs and no lower extremity edema ABDOMEN:abdomen soft, non-tender and normal bowel sounds Musculoskeletal:no cyanosis of digits and no clubbing    LABORATORY DATA:  I have reviewed the data as listed CBC Latest Ref Rng & Units 08/27/2017 08/13/2017  07/30/2017  WBC 4.0 - 10.3 K/uL 5.0 4.5 4.0  Hemoglobin 13.0 - 17.1 g/dL 15.1 15.2 15.5  Hematocrit 38.4 - 49.9 % 44.9 45.0 46.5  Platelets 140 - 400 K/uL 158 147 154     CMP Latest Ref Rng & Units 08/27/2017 08/13/2017 07/30/2017  Glucose 70 - 140 mg/dL 103 119 128  BUN 7 - 26 mg/dL 31(H) 27(H) 31(H)  Creatinine 0.70 - 1.30 mg/dL 1.36(H) 1.40(H) 1.41(H)  Sodium 136 - 145 mmol/L 138 136 141  Potassium 3.5 - 5.1 mmol/L 5.2(H) 5.4(H) 4.5  Chloride 98 - 109 mmol/L 107 107 110(H)  CO2 22 - 29 mmol/L _1 Calcium 8.4 - 10.4 mg/dL 9.2 9.3 9.5  Total Protein 6.4 - 8.3 g/dL 7.0 6.9 6.7  Total Bilirubin 0.2 - 1.2 mg/dL 0.6 0.7 0.5  Alkaline Phos 40 - 150 U/L 54 58 50  AST 5 - 34 U/L _2 ALT 0 - 55 U/L _3 PATHOLOGY REPORT:   Diagnosis 07/16/17  Skin , left medial knee SPONGIOTIC PSORIASIFORM DERMATITIS, SEE DESCRIPTION Microscopic Comment There is a spongiotic and psoriasiform tissue reaction involving the epidermis with an underlying mononuclear cell infiltrate in which eosinophils are not conspicuous. This pattern can be seen in a chronic eczematous reaction such as a chronic contact dermatitis, id reaction, or nummular eczema. A special stain (PAS-F) is performed to determine the presence or absence of fungal hyphae in this biopsy. The PAS stain is negative for fungal hyphae, and the controls stained appropriately. The slides were also reviewed by Dr. Andria Frames, who is in general agreement with the diagnosis.   Diagnosis 09/21/2015 Lymph node, needle/core biopsy, hypermetabolic left sub clavicular LN - METASTATIC MALIGNANT MELANOMA. - SEE COMMENT.  Diagnosis 10/15/2015 Brain, for tumor resection, Left frontal - METASTATIC MELANOMA.  PROCEDURES: Colonoscopy 05/08/16 - Stool in the ascending colon. Fluid aspiration performed. - Diverticulosis in the entire examined colon. - Non-bleeding internal hemorrhoids. - The examination was otherwise normal on direct and  retroflexion views.  RADIOGRAPHIC STUDIES: I have personally reviewed the radiological images as listed and agreed with the findings in the report. No results found.   CT Head with and without contrast 11/27/2016 IMPRESSION: 1. Continued evolution of post radiation changes in the left frontal lobe with further decrease in the area of contrast enhancement and adjacent edema. 2. No new contrast-enhancing lesions.  PET 10/18/16 IMPRESSION: 1. New focal hypermetabolism in the proximal sigmoid colon with associated focal colonic wall thickening and mild pericolonic fat stranding at the site of a proximal sigmoid diverticulum, favored to represent mild acute sigmoid diverticulitis. Metastatic disease is considered less likely. Clinical correlation is necessary. Consider appropriate antibiotic therapy, as clinically warranted. Attention to this region recommended on future PET-CT follow-up. 2. Otherwise complete metabolic response with no evidence of hypermetabolic metastatic disease. Lingular pulmonary nodule is slightly decreased in size and demonstrates no significant metabolism. 3. No residual hypermetabolism in the right thyroid lobe nodule, which is stable in size. 4. Aortic Atherosclerosis (ICD10-I70.0). Stable 3.1 cm infrarenal abdominal aortic aneurysm. Aortic aneurysm NOS (ICD10-I71.9). Recommend followup by ultrasound in 3 years. This recommendation follows ACR consensus guidelines: White Paper of the ACR Incidental Findings Committee II on Vascular Findings. J Am Coll Radiol 2013;   CT Head W and WO Contrast 10/05/16 IMPRESSION: 1. Decreased size of the enhancing area within the left frontal lobe treatment site. Surrounding edema has also decreased. The findings support continued evolution of radiation necrosis. 2. No new enhancing lesions.  CT Head 09/02/16 IMPRESSION: Somewhat mixed pattern of slight posterior progression of pleomorphic enhancement surrounding the LEFT  lateral ventricle although no significant mass effect, and reduced vasogenic edema compared with the scan in April.  CT Head W WO Contrast 2016/08/11 IMPRESSION: Pronounced change in the left frontal region. Much more regional vasogenic edema. Relatively stable appearance of the abnormal enhancement extending from the lateral margin of the frontal horn of left lateral ventricle to the frontal brain surface. New region of abnormal enhancement surrounding the frontal horn of the left lateral ventricle measuring approximately 3.2 cm. The differential diagnosis is recurrent tumor versus radiation necrosis. The patient is reportedly not an MRI candidate. This area is large enough that CT perfusion could possibly make a reasonable differentiation.  PET Image Restage 05/29/16 IMPRESSION: 1. Stable exam with no evidence progression. 2. Photopenia in the LEFT frontal lobe at prior surgical site. 3. Stable mildly hypermetabolic RIGHT paratracheal node. This may represent a thyroid nodule. 4. Stable size of minimally metabolic lingular pulmonary nodule. 5. Stable skeletal lesion of the RIGHT iliac crest 6. No cutaneous metabolic lesion.   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   1. Metastatic melanoma to lung, node, bone and brain, stage IV, BRAF V600K mutation (+) -I previously  reviewed his PET/CT scan images with pt and his family members. The scan revealed hypermetabolic left upper lobe lung mass, 2 small right lung nodules, probable bone metastasis, and a subcutaneous left supraclavicular lesion, which is not palpable  -His repeat CT had showed a 2.0 cm left frontal hyperdense lesion with enhancement on contrast, in the area of his recent hemorrhagic stroke, highly suspicious for brain metastasis.  -We previously discussed his  left supraclavicular  node biopsy results, which showed metastatic melanoma.. -His case was discussed in our sarcoma tumor board. -I previously recommended a first-line treatment with Nivolamab alone, given his recent stroke, low tumor burden. I'll reserve BRAF inhibitor with or without MEK inhibitor as a second line therapy if he progress on immunotherapy. Other options of immunotherapy, especially checkpoint inhibitor with ipilumimab, were also discussed with pt and his daughter -He is on first line therapy with Nivolumab, has been tolerating Nivolumab well, no significant side effects except fatigue. It was held from 09/06/16 to 12/18/2016 due to his chronic steroids use -He has had near complete metabolic response on the recent restaging PET scan in July 2018. -We'll continue Delton See monthly for his bone metastasis. -He has recently developed neurological symptoms secondary to his radionecrosis in May 2018, on tapering dose of dexa  -He tried dabrafenib and trametinib when he was off Nivo but tolerated poorly, stopped on 11/01/2016. -PET can 12/13/16 shows no evidence of disease. He has achieved a radiographic complete response.  -He has restarted Nivolamab on 12/18/16 and changed to every 4 weeks on 01/30/17. I plan to continue Nivolumab for total of 2 years if he remains to be NED on scan -He finish tapering steroids on 10/32/18, he has tolerated this well.  -I previously encouraged him to be active with walking or the gym since he has completed physical therapy, his daughter has been encouraging to use his pool in his facility.  -I discussed his residual muscle atrophy from previous steroid use can be helped with PT and being active with exercise, which I encourage him to do  -PET scan on 04/18/2017 again showed complete radiographic metabolic response with only a residual 25m left lung nodule without any FDG uptake. Discussed results with pt and he is pleased  -Due to his fatigue after treatment, will continue his Nivolumab  every 2 weeks  -If he remains to be in complete response by September 2019, I will stop his nivolumab after a total of 2 years treatment. -I encourged him to continue exercising with stationary bike 1-2 times a day and rest as needed. -He is clinically doing well, no symptoms, except memory loss and cognitive dysfunction, able to do self-care at home. -Labs reviewed today, prior Anemia resolved, overall adequate for continuing with Nivolumab today.  -Plan for next PET scan in 10/2017.  -f/u in 4 weeks   2. Skin rash- Pruritic maculopapular rash to back, arms, hands; scaly plaque rash to legs -Skin rash on left medial leg was biopsied, path revealed spongiotic psoriasiform dermatitis, no evidence of fungal infection. Stains suggest chronic eczematous reaction such as a chronic contact dermatitis, ID reaction, or nummular eczema.  -I believe this is related to Nivolumab. The rash improved dramatically with lidex cream. -I recommend he continue lidex; I reviewed risk of possible flare in the future -Rash is mostly gone now. I advised him to reduce topical steriod use to no more than once a day. If his rash progresses or reoccurs he can increase use.    3. History of hemorrhagic stroke and brain metastasis  -I encouraged him to continue physical therapy and occupational therapy. 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 However his clinical neurological function has deteriorated -I have previously discussed with our neuro-oncologist Dr. Mickeal Skinner, and both of [place] feel he is not a candidate for Avastin for his radionecrosis due to the risk of bleeding -I reviewed his restaging  CT had with our neuro-oncologist Dr. Mickeal Skinner, his radionecrosis has improved, no other new lesions. He was also seen by Dr. Mickeal Skinner who feels he is recovering well from neurological standpoint -I tapered his steroids until he stopped altogether by 01/29/17 -He will f/u with Dr. Mickeal Skinner.  4. 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 Mickeal Skinner to discuss treatment options to improve his neurologic function, patient is very frustrated about this, which significantly impacting his quality of life. -continue PT/OT  -CT Head 11/27/2016 showed continued evolution of post radiation changes in the left frontal lobe with further decrease in the area of contrast enhancement and adjacent edema. No new contrast-enhancing lesions.  -The patient previously reviewed these results with neuro-oncologist, Dr. Mickeal Skinner.   5.  Hypercalcemia and CKD stage III -he was found to have calcium 13.4 and Cr 2.0 on his office visit 11/01/2016 -Possibly related to his underlying malignancy (bone mets) and missing one dose of Xgeva  -He has restarted Xgeva monthly on 11/10/16. Calcium has been normal lately  -Cr also improved, it has been slightly elevated, EGFR in 50s -I advised him to eat less cheese and cut out milk and dairy products as possible --I previously encouraged him to drink water adequately -avoid NSAIDs -Ca normal at 9.2, Cr at 1.36 today (08/27/17)  6. Fatigue, hypothyroidism and hypotestosteronemia -He has baseline hypothyroidism and hypotestosteronemia -His fatigue has much improved since he restarted testosterone injection by his primary care physician -His TSH level has been slightly fluctuating, possibly partially related to Nivolumab -He will  follow-up with his primary care physician for his Synthroid dose adjustment    7. HTN   -he is on amlodipine and carvedilol, follow-up of his primary care physician -Due to drug interaction with new oral chemo he will need to monitor his BP more often.  -Systolic BP has been high, 148/101 on 03/26/17. He is currently on low dose Coreg. He has been dizzy lately. I will send note to Dr. Asher Muir about medication. -163/116 today, pt will follow up with PCP and maybe add another form of BP medication. No chest pain or headaches. Will repeat in infusion room.  -I advised him to continue to keep a BP log at home -BP has been normal in clinic lately.    8. Depression  She is on Zoloft 50m daily, management by his PCP  -improved overall   9.Goal of care discussion  -We previously discussed the incurable nature of his metastatic melanoma, and the overall poor prognosis, especially if he does not have good response to chemotherapy or progress on chemo -The patient understands the goal of care is palliative. -He is full code, recent PET showed NED   10. CAD -history of 2 double vessel bypass -He has pPsychologist, forensic-per pt (her daughter) request, he has switched his cardiologist to Dr. BHaroldine Laws   Plan -Labs reviewed, will proceed Nivolumab today and continue every 2 weeks -Xgeva injection today and continue every 4 weeks  -F/u  in 4 weeks, will order restaging PET on next visit  -Lab, and nivolumab in 4 and 6weeks (morning)   All questions were answered. The patient knows to call the clinic with any problems, questions or concerns. No barriers to learning was detected.  I spent 20 minutes counseling the patient face to face. The total time spent in the appointment was 25 minutes and more than 50% was on counseling and review of test results.  This document serves as a record of services personally performed by [name], MD. It was created on her behalf by Joslyn Devon, a trained medical scribe.  The creation of this record is based on the scribe's personal observations and the provider's statements to them.    I have reviewed the above documentation for accuracy and completeness, and I agree with the above.     [name], MD 08/27/17

## 2017-08-27 NOTE — Telephone Encounter (Signed)
Printed avs and calender of upcoming appointment . Per 5/20 los 

## 2017-09-03 ENCOUNTER — Encounter: Payer: Self-pay | Admitting: Family Medicine

## 2017-09-04 ENCOUNTER — Encounter: Payer: Self-pay | Admitting: Internal Medicine

## 2017-09-04 ENCOUNTER — Ambulatory Visit (INDEPENDENT_AMBULATORY_CARE_PROVIDER_SITE_OTHER): Payer: Medicare Other | Admitting: Internal Medicine

## 2017-09-04 VITALS — BP 112/82 | HR 75 | Ht 69.0 in | Wt 200.5 lb

## 2017-09-04 DIAGNOSIS — I255 Ischemic cardiomyopathy: Secondary | ICD-10-CM | POA: Diagnosis not present

## 2017-09-04 DIAGNOSIS — Z95810 Presence of automatic (implantable) cardiac defibrillator: Secondary | ICD-10-CM | POA: Diagnosis not present

## 2017-09-04 NOTE — Progress Notes (Signed)
ELECTROPHYSIOLOGY CONSULT NOTE  Patient ID: David Pelc., MRN: [id number], DOB/AGE: [birth year] 78 y.o. Admit date: (Not on file) Date of Consult: 09/04/2017  Primary Physician: [name], MD Primary Cardiologist: Saverio Danker David Noon. is a 77 y.o. male who is being seen today for the evaluation of pacemaker function at the request of TG.    HPI David Weightman. 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  It is his recollection that this is his fourth device.  Hx of ischemic heart disease and prior CABG 2014; denies chest pain.  He does not have edema.  He has some mild shortness of breath with exertion.  He has stage 4 melanoma with brain mets and neuro complications including memory and aphasia; treated sleep apnea DATE TEST EF   11/12 Cardiac CTA  T LAD  11/12 Myoview 42% Inferoapical scar  11/12 Echo 45-50%   6/18 Echo   60-65 %          Alcohol intake has been variably exuberant.  Now he is drinking 2-4 days a week which includes some days a bottle of wine and other days scotch; his wife and daughter remain concerned  Past Medical History:  Diagnosis Date  . Anginal pain ([place])   . Arthritis    mostly hands  . Benign familial hematuria   . BPH (benign prostatic hypertrophy)   . Brain cancer ([place])    melanoma with brain met  . Coronary artery disease   . GERD (gastroesophageal reflux disease)   . High cholesterol   . Hypertension   . Hypothyroidism   . Intracerebral hemorrhage ([place])   . Melanoma ([place])   . Metastatic melanoma to lung ([place])   . Mood disorder ([place])   . Myocardial infarction ([place])   . Pacemaker    due to syncope, 3rd degree HB (upgrade to [facility]. Jude CRT-P 02/25/13 (Dr. Uvaldo Rising)  . Rectal bleed    due to NSAIDS  . Renal disorder   . Skin cancer    melanoma  . Sleep apnea    uses CPAP  . Stroke [facility])       Surgical History:    Past Surgical History:  Procedure Laterality Date  . APPLICATION OF CRANIAL NAVIGATION N/A 10/15/2015   Procedure: APPLICATION OF CRANIAL NAVIGATION;  Surgeon: Erline Levine, MD;  Location: [facility]URO ORS;  Service: Neurosurgery;  Laterality: N/A;  . CARDIAC CATHETERIZATION  2013  . CARDIAC SURGERY     bypass X 2  . COLONOSCOPY WITH PROPOFOL N/A 05/08/2016   Procedure: COLONOSCOPY WITH PROPOFOL;  Surgeon: Jonathon Bellows, MD;  Location: [facility]COPY;  Service: Gastroenterology;  Laterality: N/A;  . CORONARY ARTERY BYPASS GRAFT  2013   LIMA-LAD, SVG-PDA 03/27/11 (Dr. Francee Gentile)  . CRANIOTOMY N/A 10/15/2015   Procedure: LEFT FRONTAL CRANIOTOMY TUMOR EXCISION with Curve;  Surgeon: Erline Levine, MD;  Location: [place] NEURO ORS;  Service: Neurosurgery;  Laterality: N/A;  CRANIOTOMY TUMOR EXCISION  . EYE SURGERY    . FOOT SURGERY  08/2010  . JOINT REPLACEMENT Left    partial knee  . KNEE ARTHROSCOPY  12/2008  . LYMPH NODE BIOPSY    . PACEMAKER INSERTION    . TONSILLECTOMY       Home Meds: Prior to Admission medications   Medication Sig Start Date End Date Taking? Authorizing Provider  acetaminophen (TYLENOL) 500  MG tablet Take 500 mg by mouth at bedtime.    [provider]  amLODipine (NORVASC) 5 MG tablet Take 1 tablet (5 mg total) by mouth daily. 05/23/17   [name], MD  aspirin EC 81 MG tablet Take 81 mg by mouth daily.    [provider]  fluocinonide cream (LIDEX) 0.05 % Apply topically 2 (two) times daily as needed. 07/30/17   Alla Feeling, NP  levothyroxine (SYNTHROID, LEVOTHROID) 137 MCG tablet Take 1 tablet (137 mcg total) by mouth daily before breakfast. 06/09/17   [name], MD  metoprolol succinate (TOPROL-XL) 50 MG 24 hr tablet Take 1.5 tablets daily (48m). Take with or immediately following a meal. 05/23/17   GRia Bush MD  pentoxifylline (TRENTAL) 400 MG CR tablet Take 1 tablet (400 mg total) by mouth 2 (two) times daily. 07/24/16   PHayden Pedro PA-C  rosuvastatin (CRESTOR) 20 MG tablet Take 1 tablet (20 mg total) by mouth at bedtime. 07/17/17   GMinna Merritts MD  sertraline (ZOLOFT) 100 MG tablet  02/07/17   [provider]  testosterone cypionate (DEPOTESTOTERONE CYPIONATE) 100 MG/ML injection Inject 100 mg into the muscle every 14 (fourteen) days.     [provider]  Turmeric 500 MG CAPS Take 2 capsules by mouth daily.    [provider]  vitamin E (VITAMIN E) 400 UNIT capsule Take 1 capsule (400 Units total) by mouth 2 (two) times daily. 07/24/16   PHayden Pedro PA-C    Allergies:  Allergies  Allergen Reactions  . Ezetimibe   . Simvastatin Other (See Comments)    Reaction:  Gave pt a fever  Fever - temp of 103.   In 2003    Social History   Socioeconomic History  . Marital status: Married    Spouse name: Not on file  . Number of children: 2  . Years of education: Not on file  . Highest education level: Not on file  Occupational History  . Occupation: retired dPharmacist, community Social Needs  . Financial resource strain: Not on file  . Food insecurity:    Worry: Not on file    Inability: Not on file  . Transportation needs:    Medical: Not on file    Non-medical: Not on file  Tobacco Use  . Smoking status: Former Smoker    Packs/day: 1.00    Years: 3.00    Pack years: 3.00    Last attempt to quit: 04/10/1957    Years since quitting: 60.4  . Smokeless tobacco: Never Used  Substance and Sexual Activity  . Alcohol use: Yes    Alcohol/week: 24.0 oz    Types: 30 Glasses of wine, 10 Cans of beer per week    Comment: 10 per week, wine per week  . Drug use: No  . Sexual activity: Never  Lifestyle  . Physical activity:    Days per week: Not on file    Minutes per session: Not on file  . Stress: Not on file  Relationships  . Social connections:    Talks on phone: Not on file    Gets together: Not on file    Attends religious service: Not on file    Active member of club  or organization: Not on file    Attends meetings of clubs or organizations: Not on file    Relationship status: Not on file  . Intimate partner violence:    Fear of current or ex partner:  Not on file    Emotionally abused: Not on file    Physically abused: Not on file    Forced sexual activity: Not on file  Other Topics Concern  . Not on file  Social History Narrative   Lives with wife David [name]) with dementia in [facility]   Daughter Dr David [name] and son   Occ: retired Pharmacist, community practiced in [place]   Has living will   Daughter David [name] is health care POA   Requests DNR-- done 09/14/15   Not sure about tube feeds     Family History  Problem Relation Age of Onset  . Cancer Mother 32       lung   . Hypertension Mother   . Hypertension Father   . Heart attack Father   . Heart disease Father   . Rheum arthritis Sister   . Cancer Maternal Grandmother      ROS:  Please see the history of present illness.     All other systems reviewed and negative.    Physical Exam: Blood pressure 112/82, pulse 75, height _0  (1.753 m), weight 200 lb 8 oz (90.9 kg). General: Well developed, well nourished male in no acute distress. Head: Normocephalic, atraumatic, sclera non-icteric, no xanthomas, nares are without discharge. EENT: normal  Lymph Nodes:  none Neck: Negative for carotid bruits. JVD not elevated. Back:without scoliosis kyphosis Lungs: Clear bilaterally to auscultation without wheezes, rales, or rhonchi. Breathing is unlabored. Heart: RRR with S1 S2. 2/6 systolic murmur . No rubs, or gallops appreciated. Abdomen: Soft, non-tender, non-distended with normoactive bowel sounds. No hepatomegaly. No rebound/guarding. No obvious abdominal masses. Msk:  Strength and tone appear normal for age. Extremities: No clubbing or cyanosis. No edema.  Distal pedal pulses are 2+ and equal bilaterally. Skin: Warm and Dry Neuro: Alert and oriented X 3. CN III-XII intact Grossly normal sensory and  motor function . Psych:  Responds to questions appropriately with a normal affect.      Labs: Cardiac Enzymes No results for input(s): CKTOTAL, CKMB, TROPONINI in the last 72 hours. CBC Lab Results  Component Value Date   WBC 5.0 08/27/2017   HGB 15.1 08/27/2017   HCT 44.9 08/27/2017   MCV 91.6 08/27/2017   PLT 158 08/27/2017   PROTIME: No results for input(s): LABPROT, INR in the last 72 hours. Chemistry No results for input(s): NA, K, CL, CO2, BUN, CREATININE, CALCIUM, PROT, BILITOT, ALKPHOS, ALT, AST, GLUCOSE in the last 168 hours.  Invalid input(s): LABALBU Lipids Lab Results  Component Value Date   CHOL 178 05/10/2017   HDL 38.30 (L) 05/10/2017   LDLCALC 135 (H) 08/24/2015   TRIG 258.0 (H) 05/10/2017   BNP No results found for: PROBNP Thyroid Function Tests: No results for input(s): TSH, T4TOTAL, T3FREE, THYROIDAB in the last 72 hours.  Invalid input(s): FREET3 Miscellaneous No results found for: DDIMER  Radiology/Studies:  No results found.  EKG: AV pacing @ 75  Upright QRS V1 and neg Lead 1   Assessment and Plan:  Complete heart block  Ischemic cardiomyopathy-interval resolution  Coronary disease with prior bypass  CRT-P [facility] Jude?  Device #4  Melanoma-brain status post surgical resection apparently disease-free  Alcohol use   Device function is normal.  Reprogramming was accomplished to minimize battery drain.  Euvolemic continue current meds  Without symptoms of ischemia  Alcohol intake is less than previously and he feels good about this      Virl Axe

## 2017-09-04 NOTE — Patient Instructions (Signed)
Medication Instructions: - Your physician recommends that you continue on your current medications as directed. Please refer to the Current Medication list given to you today.  Labwork: - none ordered  Procedures/Testing: - none ordered  Follow-Up: - Remote monitoring is used to monitor your Pacemaker of ICD from home. This monitoring reduces the number of office visits required to check your device to one time per year. It allows [place] to keep an eye on the functioning of your device to ensure it is working properly. You are scheduled for a device check from home on 12/04/17. You may send your transmission at any time that day. If you have a wireless device, the transmission will be sent automatically. After your physician reviews your transmission, you will receive a postcard with your next transmission date.  - Your physician wants you to follow-up in: February 2020 with Dr. Caryl Comes. You will receive a reminder letter in the mail two months in advance. If you don't receive a letter, please call our office to schedule the follow-up appointment.   Any Additional Special Instructions Will Be Listed Below (If Applicable).     If you need a refill on your cardiac medications before your next appointment, please call your pharmacy.

## 2017-09-10 ENCOUNTER — Inpatient Hospital Stay: Payer: Medicare Other

## 2017-09-10 ENCOUNTER — Inpatient Hospital Stay: Payer: Medicare Other | Attending: Hematology

## 2017-09-10 VITALS — BP 130/82 | HR 75 | Temp 97.7°F | Resp 16 | Ht 69.0 in | Wt 204.4 lb

## 2017-09-10 DIAGNOSIS — C439 Malignant melanoma of skin, unspecified: Secondary | ICD-10-CM

## 2017-09-10 DIAGNOSIS — C7800 Secondary malignant neoplasm of unspecified lung: Secondary | ICD-10-CM | POA: Insufficient documentation

## 2017-09-10 DIAGNOSIS — Z5112 Encounter for antineoplastic immunotherapy: Secondary | ICD-10-CM | POA: Diagnosis not present

## 2017-09-10 DIAGNOSIS — Z79899 Other long term (current) drug therapy: Secondary | ICD-10-CM | POA: Insufficient documentation

## 2017-09-10 DIAGNOSIS — I619 Nontraumatic intracerebral hemorrhage, unspecified: Secondary | ICD-10-CM

## 2017-09-10 DIAGNOSIS — I10 Essential (primary) hypertension: Secondary | ICD-10-CM

## 2017-09-10 DIAGNOSIS — C770 Secondary and unspecified malignant neoplasm of lymph nodes of head, face and neck: Secondary | ICD-10-CM | POA: Diagnosis not present

## 2017-09-10 DIAGNOSIS — C7951 Secondary malignant neoplasm of bone: Secondary | ICD-10-CM | POA: Insufficient documentation

## 2017-09-10 DIAGNOSIS — C799 Secondary malignant neoplasm of unspecified site: Secondary | ICD-10-CM

## 2017-09-10 LAB — CBC WITH DIFFERENTIAL/PLATELET
Basophils Absolute: 0 10*3/uL (ref 0.0–0.1)
Basophils Relative: 1 %
EOS ABS: 0.3 10*3/uL (ref 0.0–0.5)
Eosinophils Relative: 6 %
HEMATOCRIT: 44 % (ref 38.4–49.9)
HEMOGLOBIN: 14.9 g/dL (ref 13.0–17.1)
LYMPHS ABS: 0.5 10*3/uL — AB (ref 0.9–3.3)
LYMPHS PCT: 10 %
MCH: 31.3 pg (ref 27.2–33.4)
MCHC: 33.8 g/dL (ref 32.0–36.0)
MCV: 92.5 fL (ref 79.3–98.0)
MONOS PCT: 7 %
Monocytes Absolute: 0.3 10*3/uL (ref 0.1–0.9)
NEUTROS PCT: 76 %
Neutro Abs: 3.6 10*3/uL (ref 1.5–6.5)
Platelets: 161 10*3/uL (ref 140–400)
RBC: 4.76 MIL/uL (ref 4.20–5.82)
RDW: 14.6 % (ref 11.0–14.6)
WBC: 4.7 10*3/uL (ref 4.0–10.3)

## 2017-09-10 LAB — COMPREHENSIVE METABOLIC PANEL
ALT: 18 U/L (ref 0–55)
AST: 23 U/L (ref 5–34)
Albumin: 3.8 g/dL (ref 3.5–5.0)
Alkaline Phosphatase: 58 U/L (ref 40–150)
Anion gap: 8 (ref 3–11)
BUN: 27 mg/dL — ABNORMAL HIGH (ref 7–26)
CHLORIDE: 110 mmol/L — AB (ref 98–109)
CO2: 21 mmol/L — ABNORMAL LOW (ref 22–29)
CREATININE: 1.31 mg/dL — AB (ref 0.70–1.30)
Calcium: 8.7 mg/dL (ref 8.4–10.4)
GFR, EST AFRICAN AMERICAN: 58 mL/min — AB (ref >60)
GFR, EST NON AFRICAN AMERICAN: 50 mL/min — AB (ref >60)
Glucose, Bld: 122 mg/dL (ref 70–140)
POTASSIUM: 5 mmol/L (ref 3.5–5.1)
SODIUM: 139 mmol/L (ref 136–145)
Total Bilirubin: 0.4 mg/dL (ref 0.2–1.2)
Total Protein: 6.8 g/dL (ref 6.4–8.3)

## 2017-09-10 LAB — TSH: TSH: 1.185 u[IU]/mL (ref 0.320–4.118)

## 2017-09-10 MED ORDER — SODIUM CHLORIDE 0.9 % IV SOLN
Freq: Once | INTRAVENOUS | Status: AC
  Administered 2017-09-10: 11:00:00 via INTRAVENOUS

## 2017-09-10 MED ORDER — NIVOLUMAB CHEMO INJECTION 100 MG/10ML
240.0000 mg | Freq: Once | INTRAVENOUS | Status: AC
  Administered 2017-09-10: 240 mg via INTRAVENOUS
  Filled 2017-09-10: qty 24

## 2017-09-10 NOTE — Patient Instructions (Signed)
[facility] Discharge Instructions for Patients Receiving Chemotherapy  Today you received the following chemotherapy agents:  Opdivo (nivolumab)  To help prevent nausea and vomiting after your treatment, we encourage you to take your nausea medication as prescribed.   If you develop nausea and vomiting that is not controlled by your nausea medication, call the clinic.   BELOW ARE SYMPTOMS THAT SHOULD BE REPORTED IMMEDIATELY:  *FEVER GREATER THAN 100.5 F  *CHILLS WITH OR WITHOUT FEVER  NAUSEA AND VOMITING THAT IS NOT CONTROLLED WITH YOUR NAUSEA MEDICATION  *UNUSUAL SHORTNESS OF BREATH  *UNUSUAL BRUISING OR BLEEDING  TENDERNESS IN MOUTH AND THROAT WITH OR WITHOUT PRESENCE OF ULCERS  *URINARY PROBLEMS  *BOWEL PROBLEMS  UNUSUAL RASH Items with * indicate a potential emergency and should be followed up as soon as possible.  Feel free to call the clinic should you have any questions or concerns. The clinic phone number is [phone].  Please show the [facility] at check-in to the Emergency Department and triage nurse.

## 2017-09-17 ENCOUNTER — Telehealth: Payer: Self-pay | Admitting: Family Medicine

## 2017-09-17 NOTE — Telephone Encounter (Signed)
Patient came by the office and said he needs a letter for the [facility].  Patient said he hasn't driven in a year and the DMV is requiring a letter to be faxed to them stating he's in good health. Patient said he'd like to be able to drive locally to [place], [place], and occasionally to [facility].  Letter can be faxed to [phone].  Patient's driver's license number is [id number]. When letter is complete, please call patient  at [phone].

## 2017-09-20 ENCOUNTER — Telehealth: Payer: Self-pay | Admitting: Internal Medicine

## 2017-09-20 ENCOUNTER — Ambulatory Visit (INDEPENDENT_AMBULATORY_CARE_PROVIDER_SITE_OTHER): Payer: Medicare Other | Admitting: Podiatry

## 2017-09-20 ENCOUNTER — Encounter: Payer: Self-pay | Admitting: Podiatry

## 2017-09-20 DIAGNOSIS — L84 Corns and callosities: Secondary | ICD-10-CM

## 2017-09-20 DIAGNOSIS — Q828 Other specified congenital malformations of skin: Secondary | ICD-10-CM | POA: Diagnosis not present

## 2017-09-20 NOTE — Telephone Encounter (Signed)
Explained that orders will be placed at 10/01/17. Nothing further needed.

## 2017-09-20 NOTE — Progress Notes (Signed)
This patient presents the office follow-up for a previously  Treated under his big toe joint right foot.  He says that the callus that has developed is painful walking.  He has been wearing his insoles in his shoes.  He denies any drainage from this painful skin lesion.David [name]  He presents the office today for continued evaluation and treatment of this skin lesion   General Appearance  Alert, conversant and in no acute stress.  Vascular  Dorsalis pedis and posterior pulses are palpable  bilaterally.  Capillary return is within normal limits  bilaterally. Temperature is within normal limits  Bilaterally.  Neurologic  [name]-Weinstein monofilament wire test within normal limits  bilaterally. Muscle power within normal limits bilaterally.  Nails Normotropic nails both feet.  Orthopedic  No limitations of motion of motion feet bilaterally.  No crepitus or effusions noted.  DJD 1st MPJ  B/L. Plantarflexed first metatarsal right foot.  Skin  Porokeratosis sub 1 right foot. There is a hemorrhagic callus  noted in the center of this hyperkeratotic lesion.  No evidence of any fluctuance or drainage noted     No signs of infections or ulcers noted.   Porokeratosis/Skin ulcer 1st MPJ left foot.    Debridement of hyperkeratotic tissue was performed right foot using a #15 blade.   He is to return to the office in 6 weeks for further evaluation and treatment.  Continue using orthoses.  RTC 6 weeks.   Gardiner Barefoot DPM

## 2017-09-20 NOTE — Telephone Encounter (Signed)
Patient calling  Has set up follow-up appointment but does need supplies for CPAP machine Please call to discuss

## 2017-09-21 NOTE — Telephone Encounter (Signed)
Letter written and in Lisa's box.  

## 2017-09-21 NOTE — Telephone Encounter (Signed)
Spoke with pt notifying him the letter was faxed.

## 2017-09-24 ENCOUNTER — Encounter: Payer: Self-pay | Admitting: Nurse Practitioner

## 2017-09-24 ENCOUNTER — Telehealth: Payer: Self-pay | Admitting: Nurse Practitioner

## 2017-09-24 ENCOUNTER — Inpatient Hospital Stay: Payer: Medicare Other

## 2017-09-24 ENCOUNTER — Other Ambulatory Visit: Payer: Self-pay | Admitting: Hematology

## 2017-09-24 ENCOUNTER — Inpatient Hospital Stay (HOSPITAL_BASED_OUTPATIENT_CLINIC_OR_DEPARTMENT_OTHER): Payer: Medicare Other | Admitting: Nurse Practitioner

## 2017-09-24 VITALS — BP 120/86 | HR 76 | Resp 18 | Ht 69.0 in

## 2017-09-24 DIAGNOSIS — I619 Nontraumatic intracerebral hemorrhage, unspecified: Secondary | ICD-10-CM

## 2017-09-24 DIAGNOSIS — C770 Secondary and unspecified malignant neoplasm of lymph nodes of head, face and neck: Secondary | ICD-10-CM | POA: Diagnosis not present

## 2017-09-24 DIAGNOSIS — C799 Secondary malignant neoplasm of unspecified site: Secondary | ICD-10-CM

## 2017-09-24 DIAGNOSIS — C7800 Secondary malignant neoplasm of unspecified lung: Secondary | ICD-10-CM

## 2017-09-24 DIAGNOSIS — Z79899 Other long term (current) drug therapy: Secondary | ICD-10-CM | POA: Diagnosis not present

## 2017-09-24 DIAGNOSIS — I10 Essential (primary) hypertension: Secondary | ICD-10-CM

## 2017-09-24 DIAGNOSIS — I25810 Atherosclerosis of coronary artery bypass graft(s) without angina pectoris: Secondary | ICD-10-CM

## 2017-09-24 DIAGNOSIS — F329 Major depressive disorder, single episode, unspecified: Secondary | ICD-10-CM | POA: Diagnosis not present

## 2017-09-24 DIAGNOSIS — N183 Chronic kidney disease, stage 3 unspecified: Secondary | ICD-10-CM

## 2017-09-24 DIAGNOSIS — E291 Testicular hypofunction: Secondary | ICD-10-CM | POA: Diagnosis not present

## 2017-09-24 DIAGNOSIS — Z5112 Encounter for antineoplastic immunotherapy: Secondary | ICD-10-CM | POA: Diagnosis not present

## 2017-09-24 DIAGNOSIS — R21 Rash and other nonspecific skin eruption: Secondary | ICD-10-CM

## 2017-09-24 DIAGNOSIS — C7951 Secondary malignant neoplasm of bone: Secondary | ICD-10-CM

## 2017-09-24 DIAGNOSIS — C439 Malignant melanoma of skin, unspecified: Secondary | ICD-10-CM

## 2017-09-24 DIAGNOSIS — E039 Hypothyroidism, unspecified: Secondary | ICD-10-CM

## 2017-09-24 DIAGNOSIS — Z8673 Personal history of transient ischemic attack (TIA), and cerebral infarction without residual deficits: Secondary | ICD-10-CM

## 2017-09-24 DIAGNOSIS — I129 Hypertensive chronic kidney disease with stage 1 through stage 4 chronic kidney disease, or unspecified chronic kidney disease: Secondary | ICD-10-CM | POA: Diagnosis not present

## 2017-09-24 LAB — COMPREHENSIVE METABOLIC PANEL
ALK PHOS: 55 U/L (ref 40–150)
ALT: 15 U/L (ref 0–55)
AST: 19 U/L (ref 5–34)
Albumin: 3.9 g/dL (ref 3.5–5.0)
Anion gap: 8 (ref 3–11)
BUN: 32 mg/dL — ABNORMAL HIGH (ref 7–26)
CO2: 24 mmol/L (ref 22–29)
CREATININE: 1.5 mg/dL — AB (ref 0.70–1.30)
Calcium: 9.2 mg/dL (ref 8.4–10.4)
Chloride: 108 mmol/L (ref 98–109)
GFR, EST AFRICAN AMERICAN: 50 mL/min — AB (ref >60)
GFR, EST NON AFRICAN AMERICAN: 43 mL/min — AB (ref >60)
Glucose, Bld: 108 mg/dL (ref 70–140)
Potassium: 4.8 mmol/L (ref 3.5–5.1)
Sodium: 140 mmol/L (ref 136–145)
Total Bilirubin: 0.5 mg/dL (ref 0.2–1.2)
Total Protein: 6.7 g/dL (ref 6.4–8.3)

## 2017-09-24 LAB — CBC WITH DIFFERENTIAL/PLATELET
Basophils Absolute: 0 10*3/uL (ref 0.0–0.1)
Basophils Relative: 0 %
Eosinophils Absolute: 0.3 10*3/uL (ref 0.0–0.5)
Eosinophils Relative: 5 %
HCT: 44.5 % (ref 38.4–49.9)
HEMOGLOBIN: 14.9 g/dL (ref 13.0–17.1)
LYMPHS ABS: 0.6 10*3/uL — AB (ref 0.9–3.3)
Lymphocytes Relative: 13 %
MCH: 31.3 pg (ref 27.2–33.4)
MCHC: 33.5 g/dL (ref 32.0–36.0)
MCV: 93.5 fL (ref 79.3–98.0)
Monocytes Absolute: 0.5 10*3/uL (ref 0.1–0.9)
Monocytes Relative: 10 %
NEUTROS PCT: 72 %
Neutro Abs: 3.4 10*3/uL (ref 1.5–6.5)
Platelets: 145 10*3/uL (ref 140–400)
RBC: 4.76 MIL/uL (ref 4.20–5.82)
RDW: 14 % (ref 11.0–14.6)
WBC: 4.8 10*3/uL (ref 4.0–10.3)

## 2017-09-24 LAB — TSH: TSH: 0.747 u[IU]/mL (ref 0.320–4.118)

## 2017-09-24 MED ORDER — DENOSUMAB 120 MG/1.7ML ~~LOC~~ SOLN
SUBCUTANEOUS | Status: AC
Start: 2017-09-24 — End: ?
  Filled 2017-09-24: qty 1.7

## 2017-09-24 MED ORDER — SODIUM CHLORIDE 0.9 % IV SOLN
Freq: Once | INTRAVENOUS | Status: AC
  Administered 2017-09-24: 13:00:00 via INTRAVENOUS

## 2017-09-24 MED ORDER — DENOSUMAB 120 MG/1.7ML ~~LOC~~ SOLN
120.0000 mg | Freq: Once | SUBCUTANEOUS | Status: AC
  Administered 2017-09-24: 120 mg via SUBCUTANEOUS

## 2017-09-24 MED ORDER — SODIUM CHLORIDE 0.9 % IV SOLN
240.0000 mg | Freq: Once | INTRAVENOUS | Status: AC
  Administered 2017-09-24: 240 mg via INTRAVENOUS
  Filled 2017-09-24: qty 24

## 2017-09-24 NOTE — Progress Notes (Signed)
OK to treat with opdivo & xgeva today with creatinine 1.5 per NP Lacie.  Pt to receive 500 ml NS bolus.

## 2017-09-24 NOTE — Progress Notes (Signed)
David [name]  [phone] Fax:(336) [phone]  Clinic Follow up Note   Patient Care Team: [name], MD as PCP - General (Family Medicine) 09/24/2017  SUMMARY OF ONCOLOGIC HISTORY: Oncology History   Metastatic melanoma [facility])   Staging form: Melanoma of the Skin, AJCC 7th Edition     Clinical stage from 09/21/2015: Stage IV [place], [place], [place]) - Signed by [name], MD on 10/09/2015       Malignant melanoma ([place])   09/16/2015 Imaging    PET scan showed a hypermetabolic 1.4GY lingular nodule, and a 5m right upper lobe lung nodule, left subclavicular nodes, and bone metastasis in the proximal right humerus and right femur.       09/21/2015 Initial Diagnosis    [place]astatic [place]      09/21/2015 Initial Biopsy    Left subclavicular lymph node biopsy showed prostatic of melanoma      09/21/2015 Miscellaneous    BRAF V600K mutation (+)       10/04/2015 Imaging    CT head with without contrast showed a 2.0 x 1.6 x 1.7 cm superficial left frontal hyperdense lesion, with postcontrast enhancement, lying within the previous hemorrhagic area, concerning for solitary melanoma metastasis      10/06/2015 - 09/06/2016 Chemotherapy    Nivolumab 2439mevery 2 weeks, held due to his dexamethasone for radionecrosis       10/13/2015 - 10/13/2015 Radiation Therapy    10/13/2015 Preop SRS treatment: 14 Gy  to the left frontal lesion in 1 fraction      10/15/2015 Surgery    left front craniotomy and tumor excision       10/15/2015 Pathology Results    left frontal brain mass excision showed metastatic melanoma, 1.5X1.5X0.6cm      01/13/2016 Imaging    CT head without and with contrast showed ill-defined enhancement and dural thickening of the left frontal resection cavity may represent a post treatment changes or residual neoplasm. A new 6 mm hypodensity in the right cerebellar hemisphere with uncertainty enhancement may represent a metastasis or small brain  parenchymal hemorrhage.       01/27/2016 - 01/27/2016 Radiation Therapy    01/27/16 SRS Treatment:  20 Gy in 1 fraction to a 6 mm right cerebellar brain met       03/29/2016 PET scan    PET 03/29/2016 IMPRESSION: 1. Overall there has been an interval response to therapy. The index lesions sided on previous exam it are less hypermetabolic when compared with the previous exam. 2. There is an intramuscular lesion within the right masseter which has increased FDG uptake compared with previous exam. 3. No new sites of disease identified. 4. Aortic atherosclerosis and infrarenal abdominal aortic aneurysm. Recommend followup by ultrasound in 3 years. This recommendation follows ACR consensus guidelines: White Paper of the ACR Incidental Findings Committee II on Vascular Findings. J Joellyn Ruedadiol 2013; 10:789-794      04/20/2016 Imaging    CT Head w wo contrast 1. Mild patchy enhancement at the left anterior frontal lobe treatment site (series 11, image 39) without mass effect and stable surrounding white matter hypodensity without strong evidence of acute vasogenic edema. Recommend continued CT surveillance. 2. No new metastatic disease or new intracranial abnormality identified. 3. Probable benign 14 mm left parietal bone lucent lesion, unchanged since May 2017.      05/06/2016 - 05/08/2016 Hospital Admission    Pt was admitted for rectal bleeding for one day. Breathing spontaneously resolved, hemoglobin  dropped from 15 to 10.5, did not require blood transfusion. Colonoscopy showed no active bleeding, except diverticulosis and hemorrhoid. He was discharged to home.      05/08/2016 Procedure    Colonoscopy  - Stool in the ascending colon. Fluid aspiration performed. - Diverticulosis in the entire examined colon. - Non-bleeding internal hemorrhoids. - The examination was otherwise normal on direct and retroflexion views.      05/29/2016 Imaging    PET Image  Restaging IMPRESSION: 1. Stable exam with no evidence progression. 2. Photopenia in the LEFT frontal lobe at prior surgical site. 3. Stable mildly hypermetabolic RIGHT paratracheal node. This may represent a thyroid nodule. 4. Stable size of minimally metabolic lingular pulmonary nodule. 5. Stable skeletal lesion of the RIGHT iliac crest 6. No cutaneous metabolic lesion.      07/20/2016 Imaging    CT Head W WO Contrast IMPRESSION: Pronounced change in the left frontal region. Much more regional vasogenic edema. Relatively stable appearance of the abnormal enhancement extending from the lateral margin of the frontal horn of left lateral ventricle to the frontal brain surface. New region of abnormal enhancement surrounding the frontal horn of the left lateral ventricle measuring approximately 3.2 cm. The differential diagnosis is recurrent tumor versus radiation necrosis. The patient is reportedly not an MRI candidate. This area is large enough that CT perfusion could possibly make a reasonable differentiation.      09/02/2016 Imaging    CT Head 09/02/16 IMPRESSION: Somewhat mixed pattern of slight posterior progression of pleomorphic enhancement surrounding the LEFT lateral ventricle although no significant mass effect, and reduced vasogenic edema compared with the scan in April.      09/27/2016 - 11/02/2016 Chemotherapy    Start dabrafenib and trametinib 09/27/16, stopped due to severe fatigue        10/05/2016 Imaging    CT Head w & w/o contrast  IMPRESSION: 1. Decreased size of the enhancing area within the left frontal lobe treatment site. Surrounding edema has also decreased. The findings support continued evolution of radiation necrosis. 2. No new enhancing lesions.      10/18/2016 PET scan    IMPRESSION: 1. New focal hypermetabolism in the proximal sigmoid colon with associated focal colonic wall thickening and mild pericolonic fat stranding at the site of a proximal  sigmoid diverticulum, favored to represent mild acute sigmoid diverticulitis. Metastatic disease is considered less likely. Clinical correlation is necessary. Consider appropriate antibiotic therapy, as clinically warranted. Attention to this region recommended on future PET-CT follow-up. 2. Otherwise complete metabolic response with no evidence of hypermetabolic metastatic disease. Lingular pulmonary nodule is slightly decreased in size and demonstrates no significant metabolism. 3. No residual hypermetabolism in the right thyroid lobe nodule, which is stable in size. 4. Aortic Atherosclerosis (ICD10-I70.0). Stable 3.1 cm infrarenal abdominal aortic aneurysm. Aortic aneurysm NOS (ICD10-I71.9). Recommend followup by ultrasound in 3 years. This recommendation follows ACR consensus guidelines: White Paper of the ACR Incidental Findings Committee II on Vascular Findings. Joellyn Rued Radiol 2013; 16:384-536.      11/01/2016 - 11/04/2016 Hospital Admission    Patient admitted to the hospital due to hypercalcemia where he received IV fluids and pamidronate      12/13/2016 PET scan    PET 12/13/16 IMPRESSION: 1. No findings to suggest metastatic disease on today's examination. 2. Previously noted hypermetabolism in the sigmoid colon has resolved, presumably indicative of a focus of diverticulitis on the prior study. Today's study does demonstrate relatively diffuse hypermetabolism throughout the cecum, ascending  colon and proximal transverse colon where there is some mild mural thickening, favored to reflect mild chronic inflammation related to underlying diverticular disease. No definite inflammatory changes on the CT portion of the examination to strongly suggest an active acute diverticulitis at this time. No discrete lesion to suggest neoplasm. 3. Previously noted 8 mm lingular nodule is stable in size and known remains non hypermetabolic. This is nonspecific but reassuring. Continued  attention on future followup studies is recommended. 4. Aortic atherosclerosis, in addition to left main and 3 vessel coronary artery disease. Status post median sternotomy for CABG including LIMA to the LAD. In addition, there is mild fusiform aneurysmal dilatation of the infrarenal abdominal aorta (3.1 cm in diameter) as well as aneurysmal dilatation of the left common iliac      12/18/2016 -  Chemotherapy    Restarted nivolamab 212m every 2 weeks, with tapering dose dexamethasone        04/18/2017 Imaging    PET Scan IMPRESSION: 1. No hypermetabolic lesion to suggest active malignancy. 2. Stable 8 mm lingular nodule is not currently hypermetabolic but merit surveillance. Infrarenal abdominal aortic aneurysm 3.1 cm in diameter. Recommend followup by [facility] 3 years. 3. Other imaging findings of potential clinical significance: Postoperative findings in the left frontal lobe. Right renal cyst. Aortic Atherosclerosis (ICD10-I70.0). Osteoarthritis. Lumbar scoliosis. Left knee effusion. Colonic diverticulosis.      CURRENT THERAPY:  Restarted nivolamab on 12/18/2016 and changed to every 4 weeks on 01/30/2017, changed back to every 2 weeks due to fatigue on 04/23/17   INTERVAL HISTORY: Dr. HKenton Kingfisherreturns for follow up and nivolumab as scheduled. He completed his last cycle on 09/10/17. He is doing well. Continues to try to be active. Fatigue is stable. Good appetite. Skin rash is nearly resolved, continues topical steroids. No new rash or itching. Denies n/v/c/d, pain, fever, chills, cough, dyspnea, chest pain, or neuro changes. 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   REVIEW OF SYSTEMS:   Constitutional: Denies fevers, chills or abnormal weight loss (+) fatigue, stable (+) good appetite  Eyes: Denies blurriness of vision Ears, nose, mouth, throat, and face: Denies mucositis or sore throat Respiratory: Denies cough, dyspnea or  wheezes Cardiovascular: Denies palpitation, chest discomfort or lower extremity swelling Gastrointestinal:  Denies nausea, vomiting, constipation, diarrhea, heartburn or change in bowel habits Skin: Denies new skin rash or itching (+) leg rash nearly resolved  Lymphatics: Denies new lymphadenopathy or easy bruising Neurological:Denies numbness, tingling, new weakness, or neuro change Behavioral/Psych: Mood is stable, no new changes  All other systems were reviewed with the patient and are negative.  MEDICAL HISTORY:  Past Medical History:  Diagnosis Date  . Anginal pain ([place]   . Arthritis    mostly hands  . Benign familial hematuria   . BPH (benign prostatic hypertrophy)   . Brain cancer ([place]    melanoma with brain met  . Coronary artery disease   . GERD (gastroesophageal reflux disease)   . High cholesterol   . Hypertension   . Hypothyroidism   . Intracerebral hemorrhage ([place]   . Melanoma ([place]   . Metastatic melanoma to lung ([place]   . Mood disorder ([place]   . Myocardial infarction ([place]   . Pacemaker    due to syncope, 3rd degree HB ([facility] 02/25/13 (Dr. BUvaldo Rising  . Rectal bleed    due to NSAIDS  . Renal disorder   .  Skin cancer    melanoma  . Sleep apnea    uses CPAP  . Stroke [facility])     SURGICAL HISTORY: Past Surgical History:  Procedure Laterality Date  . APPLICATION OF CRANIAL NAVIGATION N/A 10/15/2015   Procedure: APPLICATION OF CRANIAL NAVIGATION;  Surgeon: Erline Levine, MD;  Location: [facility];  Service: Neurosurgery;  Laterality: N/A;  . CARDIAC CATHETERIZATION  2013  . CARDIAC SURGERY     bypass X 2  . COLONOSCOPY WITH PROPOFOL N/A 05/08/2016   Procedure: COLONOSCOPY WITH PROPOFOL;  Surgeon: Jonathon Bellows, MD;  [facility]COPY;  Service: Gastroenterology;  Laterality: N/A;  . CORONARY ARTERY BYPASS GRAFT  2013   LIMA-LAD, SVG-PDA 03/27/11 (Dr. Francee Gentile)  . CRANIOTOMY N/A 10/15/2015   Procedure: LEFT FRONTAL CRANIOTOMY TUMOR  EXCISION with Curve;  Surgeon: Erline Levine, MD;  Location: [facility];  Service: Neurosurgery;  Laterality: N/A;  CRANIOTOMY TUMOR EXCISION  . EYE SURGERY    . FOOT SURGERY  08/2010  . JOINT REPLACEMENT Left    partial knee  . KNEE ARTHROSCOPY  12/2008  . LYMPH NODE BIOPSY    . PACEMAKER INSERTION    . TONSILLECTOMY      I have reviewed the social history and family history with the patient and they are unchanged from previous note.  ALLERGIES:  is allergic to ezetimibe and simvastatin.  MEDICATIONS:  Current Outpatient Medications  Medication Sig Dispense Refill  . acetaminophen (TYLENOL) 500 MG tablet Take 500 mg by mouth at bedtime.    Marland Kitchen amLODipine (NORVASC) 5 MG tablet Take 1 tablet (5 mg total) by mouth daily. 90 tablet 3  . aspirin EC 81 MG tablet Take 81 mg by mouth daily.    . fluocinonide cream (LIDEX) 0.05 % Apply topically 2 (two) times daily as needed. 30 g 1  . levothyroxine (SYNTHROID, LEVOTHROID) 137 MCG tablet Take 1 tablet (137 mcg total) by mouth daily before breakfast. 30 tablet 3  . metoprolol succinate (TOPROL-XL) 50 MG 24 hr tablet Take 1.5 tablets daily (80m). Take with or immediately following a meal. 135 tablet 3  . pentoxifylline (TRENTAL) 400 MG CR tablet Take 1 tablet (400 mg total) by mouth 2 (two) times daily. 60 tablet 6  . rosuvastatin (CRESTOR) 20 MG tablet Take 1 tablet (20 mg total) by mouth at bedtime. 90 tablet 3  . sertraline (ZOLOFT) 100 MG tablet     . testosterone cypionate (DEPOTESTOTERONE CYPIONATE) 100 MG/ML injection Inject 100 mg into the muscle every 14 (fourteen) days.     . Turmeric 500 MG CAPS Take 2 capsules by mouth daily.    . vitamin E (VITAMIN E) 400 UNIT capsule Take 1 capsule (400 Units total) by mouth 2 (two) times daily. 60 capsule 6   No current facility-administered medications for this visit.    Facility-Administered Medications Ordered in Other Visits  Medication Dose Route Frequency Provider Last Rate Last Dose  .  0.9 %  sodium chloride infusion  1,000 mL Intravenous Once FTruitt Merle MD        PHYSICAL EXAMINATION: ECOG PERFORMANCE STATUS: 2 - Symptomatic, <50% confined to bed  Vitals:   09/24/17 1115  BP: 120/86  Pulse: 76  Resp: 18  SpO2: 94%  wt 204.8 lbs on 09/24/17   There were no vitals filed for this visit.  GENERAL:alert, no distress and comfortable SKIN: plaque rash to legs is resolved, mild hyperpigmentation from previous plauqe on medial left knee  EYES: normal, Conjunctiva are  pink and non-injected, sclera clear OROPHARYNX:no thrush or ulcers  LYMPH:  no palpable cervical or supraclavicular lymphadenopathy LUNGS: clear to auscultation with normal breathing effort HEART: regular rate & rhythm and no murmurs and no lower extremity edema ABDOMEN:abdomen soft, non-tender and normal bowel sounds Musculoskeletal:no cyanosis of digits and no clubbing  NEURO: alert & oriented x 3 with fluent speech  LABORATORY DATA:  I have reviewed the data as listed CBC Latest Ref Rng & Units 09/24/2017 09/10/2017 08/27/2017  WBC 4.0 - 10.3 K/uL 4.8 4.7 5.0  Hemoglobin 13.0 - 17.1 g/dL 14.9 14.9 15.1  Hematocrit 38.4 - 49.9 % 44.5 44.0 44.9  Platelets 140 - 400 K/uL 145 161 158     CMP Latest Ref Rng & Units 09/24/2017 09/10/2017 08/27/2017  Glucose 70 - 140 mg/dL 108 122 103  BUN 7 - 26 mg/dL 32(H) 27(H) 31(H)  Creatinine 0.70 - 1.30 mg/dL 1.50(H) 1.31(H) 1.36(H)  Sodium 136 - 145 mmol/L 140 139 138  Potassium 3.5 - 5.1 mmol/L 4.8 5.0 5.2(H)  Chloride 98 - 109 mmol/L 108 110(H) 107  CO2 22 - 29 mmol/L 24 21(L) 23  Calcium 8.4 - 10.4 mg/dL 9.2 8.7 9.2  Total Protein 6.4 - 8.3 g/dL 6.7 6.8 7.0  Total Bilirubin 0.2 - 1.2 mg/dL 0.5 0.4 0.6  Alkaline Phos 40 - 150 U/L 55 58 54  AST 5 - 34 U/L _0 ALT 0 - 55 U/L _1 PATHOLOGY REPORT:   Diagnosis 07/16/17  Skin , left medial knee SPONGIOTIC PSORIASIFORM DERMATITIS, SEE DESCRIPTION Microscopic Comment There is a spongiotic and  psoriasiform tissue reaction involving the epidermis with an underlying mononuclear cell infiltrate in which eosinophils are not conspicuous. This pattern can be seen in a chronic eczematous reaction such as a chronic contact dermatitis, id reaction, or nummular eczema. A special stain (PAS-F) is performed to determine the presence or absence of fungal hyphae in this biopsy. The PAS stain is negative for fungal hyphae, and the controls stained appropriately. The slides were also reviewed by Dr. Andria Frames, who is in general agreement with the diagnosis.   Diagnosis 09/21/2015 Lymph node, needle/core biopsy, hypermetabolic left sub clavicular LN - METASTATIC MALIGNANT MELANOMA. - SEE COMMENT.  Diagnosis 10/15/2015 Brain, for tumor resection, Left frontal - METASTATIC MELANOMA.        RADIOGRAPHIC STUDIES: I have personally reviewed the radiological images as listed and agreed with the findings in the report. No results found.   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   1. Metastatic melanoma to lung, node, bone and brain, stage IV, BRAF V600K mutation (+) 2. Skin rash- Pruritic maculopapular rash to back, arms, hands; scaly plaque rash to legs 3. H/o hemorrhagic stroke and brain metastasis  4. Memory loss and cognitive dysfunction 5. Hypercalcemia and CKD stage III 6. Fatigue, hypothyroidism, hypotestosteronemia  7. HTN 8. Depression 9. Goals of care discussion  10. CAD   Dr. Kenton [name] appears stable. He completed another cycle Nivolumab on 09/10/17, he continues to tolerate well. I encouraged him to continue physical activity, healthy diet, and adequate hydration. Labs reviewed, CBC is stable. Cr slightly increased to 1.5, will give 500 cc NS. Otherwise labs adequate to proceed with next cycle Nivolumab, continue q2 weeks, and monthly xgeva.  He will have CT head w/wo contrast on 7/3 and f/u with Dr. Mickeal Skinner on 7/9;  plan to obtain restaging PET same day to consolidate care. F/u with Dr. Burr Medico in 4 weeks with ongoing treatment.  PLAN: -Labs reviewed, proceed with next cycle Nivolumab, continue q2 weeks -Xgeva due today, continue monthly  -CT head 7/3, f/u Dr. Mickeal Skinner 7/9 -Restaging PET whole body 10/10/17 with CT if possible (ordered today) -F/u Dr. Burr Medico in 4 weeks -500 cc NS over 1 hour today for increased creatinine    Orders Placed This Encounter  Procedures  . NM PET Image Restage (PS) Whole Body    Standing Status:   Future    Standing Expiration Date:   09/24/2018    Scheduling Instructions:     Please schedule same day as CT head on 10/10/17 if possible    Order Specific Question:   If indicated for the ordered procedure, I authorize the administration of a radiopharmaceutical per Radiology protocol    Answer:   Yes    Order Specific Question:   Preferred imaging location?    Answer:   [facility]    Order Specific Question:   Radiology Contrast Protocol - do NOT remove file path    Answer:   [email or web address]    Order Specific Question:   Reason for Exam additional comments    Answer:   metastatic melanoma on continuous nivolumab q2 weeks; restaging   All questions were answered. The patient knows to call the clinic with any problems, questions or concerns. No barriers to learning was detected. I spent 20 minutes counseling the patient face to face. The total time spent in the appointment was 25 minutes and more than 50% was on counseling and review of test results.     Alla Feeling, NP 09/24/17

## 2017-09-24 NOTE — Telephone Encounter (Signed)
Scheduled appt per 6/17 los - gave patient aVS and calender per los.

## 2017-09-24 NOTE — Patient Instructions (Signed)
[facility] Discharge Instructions for Patients Receiving Chemotherapy  Today you received the following chemotherapy agents: Opdivo (nivolumab)  To help prevent nausea and vomiting after your treatment, we encourage you to take your nausea medication as prescribed.   If you develop nausea and vomiting that is not controlled by your nausea medication, call the clinic.   BELOW ARE SYMPTOMS THAT SHOULD BE REPORTED IMMEDIATELY:  *FEVER GREATER THAN 100.5 F  *CHILLS WITH OR WITHOUT FEVER  NAUSEA AND VOMITING THAT IS NOT CONTROLLED WITH YOUR NAUSEA MEDICATION  *UNUSUAL SHORTNESS OF BREATH  *UNUSUAL BRUISING OR BLEEDING  TENDERNESS IN MOUTH AND THROAT WITH OR WITHOUT PRESENCE OF ULCERS  *URINARY PROBLEMS  *BOWEL PROBLEMS  UNUSUAL RASH Items with * indicate a potential emergency and should be followed up as soon as possible.  Feel free to call the clinic should you have any questions or concerns. The clinic phone number is [phone].  Please show the [place] at check-in to the Emergency Department and triage nurse.    Denosumab injection What is this medicine? DENOSUMAB (den oh sue mab) slows bone breakdown. Prolia is used to treat osteoporosis in women after menopause and in men. Delton See is used to treat a high calcium level due to cancer and to prevent bone fractures and other bone problems caused by multiple myeloma or cancer bone metastases. Delton See is also used to treat giant cell tumor of the bone. This medicine may be used for other purposes; ask your health care provider or pharmacist if you have questions. COMMON BRAND NAME(S): Prolia, XGEVA What should I tell my health care provider before I take this medicine? They need to know if you have any of these conditions: -dental disease -having surgery or tooth extraction -infection -kidney disease -low levels of calcium or Vitamin D in the blood -malnutrition -on hemodialysis -skin conditions or  sensitivity -thyroid or parathyroid disease -an unusual reaction to denosumab, other medicines, foods, dyes, or preservatives -pregnant or trying to get pregnant -breast-feeding How should I use this medicine? This medicine is for injection under the skin. It is given by a health care professional in a hospital or clinic setting. If you are getting Prolia, a special MedGuide will be given to you by the pharmacist with each prescription and refill. Be sure to read this information carefully each time. For Prolia, talk to your pediatrician regarding the use of this medicine in children. Special care may be needed. For Delton See, talk to your pediatrician regarding the use of this medicine in children. While this drug may be prescribed for children as young as 13 years for selected conditions, precautions do apply. Overdosage: If you think you have taken too much of this medicine contact a poison control center or emergency room at once. NOTE: This medicine is only for you. Do not share this medicine with others. What if I miss a dose? It is important not to miss your dose. Call your doctor or health care professional if you are unable to keep an appointment. What may interact with this medicine? Do not take this medicine with any of the following medications: -other medicines containing denosumab This medicine may also interact with the following medications: -medicines that lower your chance of fighting infection -steroid medicines like prednisone or cortisone This list may not describe all possible interactions. Give your health care provider a list of all the medicines, herbs, non-prescription drugs, or dietary supplements you use. Also tell them if you smoke, drink alcohol,  or use illegal drugs. Some items may interact with your medicine. What should I watch for while using this medicine? Visit your doctor or health care professional for regular checks on your progress. Your doctor or health care  professional may order blood tests and other tests to see how you are doing. Call your doctor or health care professional for advice if you get a fever, chills or sore throat, or other symptoms of a cold or flu. Do not treat yourself. This drug may decrease your body's ability to fight infection. Try to avoid being around people who are sick. You should make sure you get enough calcium and vitamin D while you are taking this medicine, unless your doctor tells you not to. Discuss the foods you eat and the vitamins you take with your health care professional. See your dentist regularly. Brush and floss your teeth as directed. Before you have any dental work done, tell your dentist you are receiving this medicine. Do not become pregnant while taking this medicine or for 5 months after stopping it. Talk with your doctor or health care professional about your birth control options while taking this medicine. Women should inform their doctor if they wish to become pregnant or think they might be pregnant. There is a potential for serious side effects to an unborn child. Talk to your health care professional or pharmacist for more information. What side effects may I notice from receiving this medicine? Side effects that you should report to your doctor or health care professional as soon as possible: -allergic reactions like skin rash, itching or hives, swelling of the face, lips, or tongue -bone pain -breathing problems -dizziness -jaw pain, especially after dental work -redness, blistering, peeling of the skin -signs and symptoms of infection like fever or chills; cough; sore throat; pain or trouble passing urine -signs of low calcium like fast heartbeat, muscle cramps or muscle pain; pain, tingling, numbness in the hands or feet; seizures -unusual bleeding or bruising -unusually weak or tired Side effects that usually do not require medical attention (report to your doctor or health care professional  if they continue or are bothersome): -constipation -diarrhea -headache -joint pain -loss of appetite -muscle pain -runny nose -tiredness -upset stomach This list may not describe all possible side effects. Call your doctor for medical advice about side effects. You may report side effects to FDA at 1-800-FDA-1088. Where should I keep my medicine? This medicine is only given in a clinic, doctor's office, or other health care setting and will not be stored at home. NOTE: This sheet is a summary. It may not cover all possible information. If you have questions about this medicine, talk to your doctor, pharmacist, or health care provider.  2018 Elsevier/Gold Standard (2016-04-18 19:17:21)

## 2017-09-25 ENCOUNTER — Ambulatory Visit: Payer: Medicare Other | Admitting: Internal Medicine

## 2017-09-25 NOTE — Progress Notes (Signed)
[facility] Consultation      Assessment and Plan:  Obstructive sleep apnea. -Patient was recently restarted on CPAP, per most recent study patient was recommended to be on CPAP pressure range of 8-15, is currently on pressure setting of 11, with slightly elevated apnea index of 12.7. - Change CPAP settings to AutoPap with pressure range 8-15.  Ischemic cardiomyopathy. -Follows with cardiology, status post pacemaker.  Metastatic melanoma. - With metastases to lung, left subclavicular nodes, bone, brain, status post left frontal craniotomy with brain metastases.  Hemorrhagic stroke. -Likely secondary to metastatic melanoma. -Expressive aphasia  Orders Placed This Encounter  Procedures  . Ambulatory Referral for DME   Return in about 1 year (around 10/02/2018).   Date: 09/25/2017  MRN# [id number] David [name]. [birth year]   David [name]. is a 77 y.o. old male seen in consultation for chief complaint of:    Chief Complaint  Patient presents with  . Sleep Apnea    pt is using cpap machine nightly without any concerns.    HPI:   Patient is a 77 yo male with a history of severe OSA but stopped after PAP stopped working. At last visit he was sent for a new sleep study and restarted on PAP. ] Since last visit he has restarted on CPAP and feels that he is doing great, he is less sleepy during the day, he tolerates the pressure well, he is using it every night.   He has metastatic melanoma with mets to brain, and had a brain bleed which required surgical craniotomy in 2017, he has also had radiation to a right cerebellar brain metastases.He has a history of 3rd degree HB s/p pacemaker.   He denies breathing difficulties  **Download data 5/22-6/20/2019>> data personally reviewed usage greater than 4 hours is 30/30 days.  Average usage on days used is 8 hours 37 minutes.  Set pressure is 11.  Residual AHI is 12.7. **Split-night sleep study  06/26/2017>> AHI of 24, CPAP was recommended with AutoPap range of 8-15.  Addendum: Review and summary of outside records 07/13/17: **Spirometry shows FVC of 106% predicted, FEV1 is up and 22% predicted, ratio 77%, there is no significant improvement bronchodilator.  Flow volume loop is normal.  Consistent with normal pulmonary functions. **Baseline sleep study 01/14/2007>>severe obstructive sleep apnea with AHI of 60 **CPAP titration study 02/07/2007>>BiPAP started 8/4 patient was noted to have complex sleep apnea syndrome, best treated at BiPAP 18/14. Most recent follow-ups 03/23/2009 showed continued use of BiPAP with good tolerance and compliance.   Social Hx:   Social History   Tobacco Use  . Smoking status: Former Smoker    Packs/day: 1.00    Years: 3.00    Pack years: 3.00    Last attempt to quit: 04/10/1957    Years since quitting: 60.5  . Smokeless tobacco: Never Used  Substance Use Topics  . Alcohol use: Yes    Alcohol/week: 24.0 oz    Types: 30 Glasses of wine, 10 Cans of beer per week    Comment: 10 per week, wine per week  . Drug use: No   Medication:    Current Outpatient Medications:  .  acetaminophen (TYLENOL) 500 MG tablet, Take 500 mg by mouth at bedtime., Disp: , Rfl:  .  amLODipine (NORVASC) 5 MG tablet, Take 1 tablet (5 mg total) by mouth daily., Disp: 90 tablet, Rfl: 3 .  aspirin EC 81 MG tablet, Take 81 mg by mouth daily., Disp: ,  Rfl:  .  fluocinonide cream (LIDEX) 0.05 %, Apply topically 2 (two) times daily as needed., Disp: 30 g, Rfl: 1 .  levothyroxine (SYNTHROID, LEVOTHROID) 137 MCG tablet, Take 1 tablet (137 mcg total) by mouth daily before breakfast., Disp: 30 tablet, Rfl: 3 .  metoprolol succinate (TOPROL-XL) 50 MG 24 hr tablet, Take 1.5 tablets daily (75mg ). Take with or immediately following a meal., Disp: 135 tablet, Rfl: 3 .  pentoxifylline (TRENTAL) 400 MG CR tablet, Take 1 tablet (400 mg total) by mouth 2 (two) times daily., Disp: 60 tablet, Rfl:  6 .  rosuvastatin (CRESTOR) 20 MG tablet, Take 1 tablet (20 mg total) by mouth at bedtime., Disp: 90 tablet, Rfl: 3 .  sertraline (ZOLOFT) 100 MG tablet, , Disp: , Rfl:  .  testosterone cypionate (DEPOTESTOTERONE CYPIONATE) 100 MG/ML injection, Inject 100 mg into the muscle every 14 (fourteen) days. , Disp: , Rfl:  .  Turmeric 500 MG CAPS, Take 2 capsules by mouth daily., Disp: , Rfl:  .  vitamin E (VITAMIN E) 400 UNIT capsule, Take 1 capsule (400 Units total) by mouth 2 (two) times daily., Disp: 60 capsule, Rfl: 6 No current facility-administered medications for this visit.   Facility-Administered Medications Ordered in Other Visits:  .  0.9 %  sodium chloride infusion, 1,000 mL, Intravenous, Once, [name], MD   Allergies:  Ezetimibe and Simvastatin  Review of Systems: Review of Systems:  Constitutional: Feels well. Cardiovascular: No chest pain.  Pulmonary: Denies dyspnea.   The remainder of systems were reviewed and were found to be negative other than what is documented in the HPI.    Physical Examination:   VS: BP 132/90 (BP Location: Left Arm, Cuff Size: Large)   Pulse 79   Resp 16   Ht 5\' 9"  (1.753 m)   Wt 204 lb (92.5 kg)   SpO2 93%   BMI 30.13 kg/m   General Appearance: No distress  Neuro:without focal findings, mental status, speech normal, alert and oriented HEENT: PERRLA, EOM intact Pulmonary: No wheezing, No rales  CardiovascularNormal S1,S2.  No m/r/g.  Abdomen: Benign, Soft, non-tender, No masses Renal:  No costovertebral tenderness  GU:  No performed at this time. Endoc: No evident thyromegaly, no signs of acromegaly or Cushing features Skin:   warm, no rashes, no ecchymosis  Extremities: normal, no cyanosis, clubbing.    LABORATORY PANEL:   CBC Recent Labs  Lab 09/24/17 1056  WBC 4.8  HGB 14.9  HCT 44.5  PLT 145    ------------------------------------------------------------------------------------------------------------------  Chemistries  Recent Labs  Lab 09/24/17 1056  NA 140  K 4.8  CL 108  CO2 24  GLUCOSE 108  BUN 32*  CREATININE 1.50*  CALCIUM 9.2  AST 19  ALT 15  ALKPHOS 55  BILITOT 0.5   ------------------------------------------------------------------------------------------------------------------  Cardiac Enzymes No results for input(s): TROPONINI in the last 168 hours. ------------------------------------------------------------  RADIOLOGY:  No results found.     Thank  you for the consultation and for allowing [facility] to assist in the care of your patient. Our recommendations are noted above.  Please contact us if we can be of further service.   Marda Stalker, M.D., F.C.C.P.  Board Certified in Internal Medicine, Pulmonary Medicine, [place], and Sleep Medicine.  [facility] Office Number: [phone]   10/01/2017

## 2017-09-26 ENCOUNTER — Other Ambulatory Visit: Payer: Self-pay

## 2017-09-26 NOTE — Telephone Encounter (Signed)
Received faxed refill request from [facility] for testosterone cu[ 200 mg/ml.  Last OV:  08/23/17 Next OV (CPE):  12/24/17

## 2017-09-27 ENCOUNTER — Encounter: Payer: Self-pay | Admitting: Internal Medicine

## 2017-09-28 ENCOUNTER — Telehealth: Payer: Self-pay | Admitting: Family Medicine

## 2017-09-28 ENCOUNTER — Telehealth: Payer: Self-pay

## 2017-09-28 DIAGNOSIS — F1010 Alcohol abuse, uncomplicated: Secondary | ICD-10-CM

## 2017-09-28 NOTE — Telephone Encounter (Signed)
Form placed in Dr. Synthia Innocent box.

## 2017-09-28 NOTE — Telephone Encounter (Signed)
Pt dropped off paperwork to be filled out for DM[employer]. Placed in [place]

## 2017-09-28 NOTE — Telephone Encounter (Signed)
Scheduled patient PET scan for 12:00 on 7/3 following the CT of the head to arrive at 10:30.  Instructed him not to eat or drink for 6 hours prior.  Patient verbalized an understanding.

## 2017-09-30 DIAGNOSIS — Z0279 Encounter for issue of other medical certificate: Secondary | ICD-10-CM

## 2017-09-30 NOTE — Telephone Encounter (Signed)
Filled and in my outbox

## 2017-10-01 ENCOUNTER — Encounter: Payer: Self-pay | Admitting: Internal Medicine

## 2017-10-01 ENCOUNTER — Ambulatory Visit (INDEPENDENT_AMBULATORY_CARE_PROVIDER_SITE_OTHER): Payer: Medicare Other | Admitting: Internal Medicine

## 2017-10-01 VITALS — BP 132/90 | HR 79 | Resp 16 | Ht 69.0 in | Wt 204.0 lb

## 2017-10-01 DIAGNOSIS — I255 Ischemic cardiomyopathy: Secondary | ICD-10-CM | POA: Diagnosis not present

## 2017-10-01 DIAGNOSIS — G4733 Obstructive sleep apnea (adult) (pediatric): Secondary | ICD-10-CM | POA: Diagnosis not present

## 2017-10-01 MED ORDER — TESTOSTERONE CYPIONATE 100 MG/ML IM SOLN: 100.0000 mg | INTRAMUSCULAR | 0 refills | Status: DC

## 2017-10-01 NOTE — Telephone Encounter (Signed)
Patient notified form is ready for pick up and $20 charge.

## 2017-10-01 NOTE — Patient Instructions (Signed)
Will change your pressure settings to Auto-CPAP with range of 8-15 cm H2O.  Continue to use cpap every night.

## 2017-10-01 NOTE — Telephone Encounter (Signed)
Eprescribed.

## 2017-10-01 NOTE — Telephone Encounter (Signed)
Thank you! David [name]

## 2017-10-03 LAB — CUP PACEART INCLINIC DEVICE CHECK
Date Time Interrogation Session: 20190626104731
Implantable Lead Implant Date: 19980107
Implantable Lead Location: 753859
Implantable Lead Location: 753860
Implantable Pulse Generator Implant Date: 20141118
MDC IDC LEAD IMPLANT DT: 19980107
MDC IDC LEAD IMPLANT DT: 20141118
MDC IDC LEAD LOCATION: 753858
Pulse Gen Model: 3242
Pulse Gen Serial Number: 7523334

## 2017-10-05 ENCOUNTER — Encounter: Payer: Self-pay | Admitting: Cardiovascular Disease

## 2017-10-08 ENCOUNTER — Inpatient Hospital Stay: Payer: Medicare Other

## 2017-10-08 ENCOUNTER — Inpatient Hospital Stay: Payer: Medicare Other | Attending: Hematology

## 2017-10-08 VITALS — BP 127/86 | HR 76 | Temp 97.7°F | Resp 16

## 2017-10-08 DIAGNOSIS — Z923 Personal history of irradiation: Secondary | ICD-10-CM | POA: Insufficient documentation

## 2017-10-08 DIAGNOSIS — C7800 Secondary malignant neoplasm of unspecified lung: Secondary | ICD-10-CM | POA: Diagnosis not present

## 2017-10-08 DIAGNOSIS — I619 Nontraumatic intracerebral hemorrhage, unspecified: Secondary | ICD-10-CM

## 2017-10-08 DIAGNOSIS — C439 Malignant melanoma of skin, unspecified: Secondary | ICD-10-CM

## 2017-10-08 DIAGNOSIS — C770 Secondary and unspecified malignant neoplasm of lymph nodes of head, face and neck: Secondary | ICD-10-CM | POA: Diagnosis not present

## 2017-10-08 DIAGNOSIS — Z9221 Personal history of antineoplastic chemotherapy: Secondary | ICD-10-CM | POA: Diagnosis not present

## 2017-10-08 DIAGNOSIS — G3184 Mild cognitive impairment, so stated: Secondary | ICD-10-CM | POA: Diagnosis not present

## 2017-10-08 DIAGNOSIS — C7931 Secondary malignant neoplasm of brain: Secondary | ICD-10-CM | POA: Diagnosis not present

## 2017-10-08 DIAGNOSIS — C799 Secondary malignant neoplasm of unspecified site: Secondary | ICD-10-CM

## 2017-10-08 DIAGNOSIS — I10 Essential (primary) hypertension: Secondary | ICD-10-CM

## 2017-10-08 DIAGNOSIS — Z5112 Encounter for antineoplastic immunotherapy: Secondary | ICD-10-CM | POA: Insufficient documentation

## 2017-10-08 DIAGNOSIS — C7951 Secondary malignant neoplasm of bone: Secondary | ICD-10-CM | POA: Diagnosis not present

## 2017-10-08 LAB — COMPREHENSIVE METABOLIC PANEL
ALBUMIN: 3.9 g/dL (ref 3.5–5.0)
ALK PHOS: 56 U/L (ref 38–126)
ALT: 23 U/L (ref 0–44)
ANION GAP: 8 (ref 5–15)
AST: 22 U/L (ref 15–41)
BUN: 28 mg/dL — AB (ref 8–23)
CALCIUM: 9.5 mg/dL (ref 8.9–10.3)
CO2: 26 mmol/L (ref 22–32)
Chloride: 111 mmol/L (ref 98–111)
Creatinine, Ser: 1.36 mg/dL — ABNORMAL HIGH (ref 0.61–1.24)
GFR calc Af Amer: 56 mL/min — ABNORMAL LOW (ref >60)
GFR, EST NON AFRICAN AMERICAN: 48 mL/min — AB (ref >60)
GLUCOSE: 136 mg/dL — AB (ref 70–99)
Potassium: 4.7 mmol/L (ref 3.5–5.1)
Sodium: 145 mmol/L (ref 135–145)
Total Bilirubin: 0.5 mg/dL (ref 0.3–1.2)
Total Protein: 6.6 g/dL (ref 6.5–8.1)

## 2017-10-08 LAB — CBC WITH DIFFERENTIAL/PLATELET
BASOS ABS: 0 10*3/uL (ref 0.0–0.1)
Basophils Relative: 0 %
EOS PCT: 6 %
Eosinophils Absolute: 0.3 10*3/uL (ref 0.0–0.5)
HCT: 45.4 % (ref 38.4–49.9)
Hemoglobin: 15.1 g/dL (ref 13.0–17.1)
Lymphocytes Relative: 7 %
Lymphs Abs: 0.4 10*3/uL — ABNORMAL LOW (ref 0.9–3.3)
MCH: 31.4 pg (ref 27.2–33.4)
MCHC: 33.3 g/dL (ref 32.0–36.0)
MCV: 94.4 fL (ref 79.3–98.0)
MONO ABS: 0.5 10*3/uL (ref 0.1–0.9)
Monocytes Relative: 10 %
NEUTROS ABS: 3.8 10*3/uL (ref 1.5–6.5)
Neutrophils Relative %: 77 %
PLATELETS: 148 10*3/uL (ref 140–400)
RBC: 4.81 MIL/uL (ref 4.20–5.82)
RDW: 14 % (ref 11.0–14.6)
WBC: 5 10*3/uL (ref 4.0–10.3)

## 2017-10-08 LAB — TSH: TSH: 0.937 u[IU]/mL (ref 0.320–4.118)

## 2017-10-08 MED ORDER — SODIUM CHLORIDE 0.9 % IV SOLN
240.0000 mg | Freq: Once | INTRAVENOUS | Status: AC
  Administered 2017-10-08: 240 mg via INTRAVENOUS
  Filled 2017-10-08: qty 24

## 2017-10-08 MED ORDER — SODIUM CHLORIDE 0.9 % IV SOLN
Freq: Once | INTRAVENOUS | Status: AC
  Administered 2017-10-08: 10:00:00 via INTRAVENOUS

## 2017-10-08 NOTE — Patient Instructions (Signed)
[facility] Discharge Instructions for Patients Receiving Chemotherapy  Today you received the following chemotherapy agents: Opdivo (nivolumab)  To help prevent nausea and vomiting after your treatment, we encourage you to take your nausea medication as prescribed.   If you develop nausea and vomiting that is not controlled by your nausea medication, call the clinic.   BELOW ARE SYMPTOMS THAT SHOULD BE REPORTED IMMEDIATELY:  *FEVER GREATER THAN 100.5 F  *CHILLS WITH OR WITHOUT FEVER  NAUSEA AND VOMITING THAT IS NOT CONTROLLED WITH YOUR NAUSEA MEDICATION  *UNUSUAL SHORTNESS OF BREATH  *UNUSUAL BRUISING OR BLEEDING  TENDERNESS IN MOUTH AND THROAT WITH OR WITHOUT PRESENCE OF ULCERS  *URINARY PROBLEMS  *BOWEL PROBLEMS  UNUSUAL RASH Items with * indicate a potential emergency and should be followed up as soon as possible.  Feel free to call the clinic should you have any questions or concerns. The clinic phone number is [phone].  Please show the CHEMO ALERT CARD at check-in to the Emergency Department and triage nurse.    Denosumab injection What is this medicine? DENOSUMAB (den oh sue mab) slows bone breakdown. Prolia is used to treat osteoporosis in women after menopause and in men. Xgeva is used to treat a high calcium level due to cancer and to prevent bone fractures and other bone problems caused by multiple myeloma or cancer bone metastases. Xgeva is also used to treat giant cell tumor of the bone. This medicine may be used for other purposes; ask your health care provider or pharmacist if you have questions. COMMON BRAND NAME(S): Prolia, XGEVA What should I tell my health care provider before I take this medicine? They need to know if you have any of these conditions: -dental disease -having surgery or tooth extraction -infection -kidney disease -low levels of calcium or Vitamin D in the blood -malnutrition -on hemodialysis -skin conditions or  sensitivity -thyroid or parathyroid disease -an unusual reaction to denosumab, other medicines, foods, dyes, or preservatives -pregnant or trying to get pregnant -breast-feeding How should I use this medicine? This medicine is for injection under the skin. It is given by a health care professional in a hospital or clinic setting. If you are getting Prolia, a special MedGuide will be given to you by the pharmacist with each prescription and refill. Be sure to read this information carefully each time. For Prolia, talk to your pediatrician regarding the use of this medicine in children. Special care may be needed. For Xgeva, talk to your pediatrician regarding the use of this medicine in children. While this drug may be prescribed for children as young as 13 years for selected conditions, precautions do apply. Overdosage: If you think you have taken too much of this medicine contact a poison control center or emergency room at once. NOTE: This medicine is only for you. Do not share this medicine with others. What if I miss a dose? It is important not to miss your dose. Call your doctor or health care professional if you are unable to keep an appointment. What may interact with this medicine? Do not take this medicine with any of the following medications: -other medicines containing denosumab This medicine may also interact with the following medications: -medicines that lower your chance of fighting infection -steroid medicines like prednisone or cortisone This list may not describe all possible interactions. Give your health care provider a list of all the medicines, herbs, non-prescription drugs, or dietary supplements you use. Also tell them if you smoke, drink alcohol,   or use illegal drugs. Some items may interact with your medicine. What should I watch for while using this medicine? Visit your doctor or health care professional for regular checks on your progress. Your doctor or health care  professional may order blood tests and other tests to see how you are doing. Call your doctor or health care professional for advice if you get a fever, chills or sore throat, or other symptoms of a cold or flu. Do not treat yourself. This drug may decrease your body's ability to fight infection. Try to avoid being around people who are sick. You should make sure you get enough calcium and vitamin D while you are taking this medicine, unless your doctor tells you not to. Discuss the foods you eat and the vitamins you take with your health care professional. See your dentist regularly. Brush and floss your teeth as directed. Before you have any dental work done, tell your dentist you are receiving this medicine. Do not become pregnant while taking this medicine or for 5 months after stopping it. Talk with your doctor or health care professional about your birth control options while taking this medicine. Women should inform their doctor if they wish to become pregnant or think they might be pregnant. There is a potential for serious side effects to an unborn child. Talk to your health care professional or pharmacist for more information. What side effects may I notice from receiving this medicine? Side effects that you should report to your doctor or health care professional as soon as possible: -allergic reactions like skin rash, itching or hives, swelling of the face, lips, or tongue -bone pain -breathing problems -dizziness -jaw pain, especially after dental work -redness, blistering, peeling of the skin -signs and symptoms of infection like fever or chills; cough; sore throat; pain or trouble passing urine -signs of low calcium like fast heartbeat, muscle cramps or muscle pain; pain, tingling, numbness in the hands or feet; seizures -unusual bleeding or bruising -unusually weak or tired Side effects that usually do not require medical attention (report to your doctor or health care professional  if they continue or are bothersome): -constipation -diarrhea -headache -joint pain -loss of appetite -muscle pain -runny nose -tiredness -upset stomach This list may not describe all possible side effects. Call your doctor for medical advice about side effects. You may report side effects to FDA at 1-800-FDA-1088. Where should I keep my medicine? This medicine is only given in a clinic, doctor's office, or other health care setting and will not be stored at home. NOTE: This sheet is a summary. It may not cover all possible information. If you have questions about this medicine, talk to your doctor, pharmacist, or health care provider.  2018 Elsevier/Gold Standard (2016-04-18 19:17:21)   

## 2017-10-10 ENCOUNTER — Ambulatory Visit (HOSPITAL_COMMUNITY)
Admission: RE | Admit: 2017-10-10 | Discharge: 2017-10-10 | Disposition: A | Discharge status: Home / Self Care | Payer: Medicare Other | Source: Ambulatory Visit | Attending: Internal Medicine | Admitting: Internal Medicine

## 2017-10-10 ENCOUNTER — Ambulatory Visit (HOSPITAL_COMMUNITY)
Admission: RE | Admit: 2017-10-10 | Discharge: 2017-10-10 | Disposition: A | Discharge status: Home / Self Care | Payer: Medicare Other | Source: Ambulatory Visit | Attending: Nurse Practitioner | Admitting: Nurse Practitioner

## 2017-10-10 DIAGNOSIS — R911 Solitary pulmonary nodule: Secondary | ICD-10-CM | POA: Diagnosis not present

## 2017-10-10 DIAGNOSIS — I700 Atherosclerosis of aorta: Secondary | ICD-10-CM | POA: Diagnosis not present

## 2017-10-10 DIAGNOSIS — C7931 Secondary malignant neoplasm of brain: Secondary | ICD-10-CM | POA: Diagnosis not present

## 2017-10-10 DIAGNOSIS — C439 Malignant melanoma of skin, unspecified: Secondary | ICD-10-CM

## 2017-10-10 DIAGNOSIS — C799 Secondary malignant neoplasm of unspecified site: Secondary | ICD-10-CM

## 2017-10-10 LAB — GLUCOSE, CAPILLARY: GLUCOSE-CAPILLARY: 66 mg/dL — AB (ref 70–99)

## 2017-10-10 MED ORDER — IOPAMIDOL (ISOVUE-300) INJECTION 61%
100.0000 mL | Freq: Once | INTRAVENOUS | Status: AC | PRN
  Administered 2017-10-10: 100 mL via INTRAVENOUS

## 2017-10-10 MED ORDER — FLUDEOXYGLUCOSE F - 18 (FDG) INJECTION
9.8000 | Freq: Once | INTRAVENOUS | Status: AC | PRN
  Administered 2017-10-10: 9.8 via INTRAVENOUS

## 2017-10-10 MED ORDER — IOPAMIDOL (ISOVUE-300) INJECTION 61%
INTRAVENOUS | Status: AC
  Filled 2017-10-10: qty 100

## 2017-10-12 ENCOUNTER — Ambulatory Visit (HOSPITAL_COMMUNITY): Payer: Medicare Other

## 2017-10-16 ENCOUNTER — Telehealth: Payer: Self-pay | Admitting: Internal Medicine

## 2017-10-16 ENCOUNTER — Inpatient Hospital Stay (HOSPITAL_BASED_OUTPATIENT_CLINIC_OR_DEPARTMENT_OTHER): Payer: Medicare Other | Admitting: Internal Medicine

## 2017-10-16 ENCOUNTER — Encounter: Payer: Self-pay | Admitting: Internal Medicine

## 2017-10-16 VITALS — BP 129/93 | HR 75 | Temp 97.7°F | Resp 17 | Ht 69.0 in | Wt 205.5 lb

## 2017-10-16 DIAGNOSIS — C439 Malignant melanoma of skin, unspecified: Secondary | ICD-10-CM | POA: Diagnosis not present

## 2017-10-16 DIAGNOSIS — C770 Secondary and unspecified malignant neoplasm of lymph nodes of head, face and neck: Secondary | ICD-10-CM

## 2017-10-16 DIAGNOSIS — Z8673 Personal history of transient ischemic attack (TIA), and cerebral infarction without residual deficits: Secondary | ICD-10-CM

## 2017-10-16 DIAGNOSIS — C7931 Secondary malignant neoplasm of brain: Secondary | ICD-10-CM | POA: Diagnosis not present

## 2017-10-16 DIAGNOSIS — G3184 Mild cognitive impairment, so stated: Secondary | ICD-10-CM

## 2017-10-16 DIAGNOSIS — C7951 Secondary malignant neoplasm of bone: Secondary | ICD-10-CM

## 2017-10-16 DIAGNOSIS — Z5112 Encounter for antineoplastic immunotherapy: Secondary | ICD-10-CM | POA: Diagnosis not present

## 2017-10-16 DIAGNOSIS — C7800 Secondary malignant neoplasm of unspecified lung: Secondary | ICD-10-CM

## 2017-10-16 NOTE — Progress Notes (Signed)
David [name] at [place], [place] [address] [phone]   Interval Evaluation  Date of Service: 10/16/17 Patient Name: David [name]. Patient MRN: [id number] Patient DOB: Jan 06, 1941 Provider: Ventura Sellers, MD  Identifying Statement:  David [name]. is a 77 y.o. male with melanoma, brain metastases  Primary Cancer: Melanoma Stage IV  Oncologic History: Oncology History   Metastatic melanoma [facility])   Staging form: Melanoma of the Skin, AJCC 7th Edition     Clinical stage from 09/21/2015: Stage IV [place], [place], [place]) - Signed by [name], MD on 10/09/2015       Malignant melanoma ([place])   09/16/2015 Imaging    PET scan showed a hypermetabolic 4.0NU lingular nodule, and a 13m right upper lobe lung nodule, left subclavicular nodes, and bone metastasis in the proximal right humerus and right femur.       09/21/2015 Initial Diagnosis    Metastatic melanoma ([place]      09/21/2015 Initial Biopsy    Left subclavicular lymph node biopsy showed prostatic of melanoma      09/21/2015 Miscellaneous    BRAF V600K mutation (+)       10/04/2015 Imaging    CT head with without contrast showed a 2.0 x 1.6 x 1.7 cm superficial left frontal hyperdense lesion, with postcontrast enhancement, lying within the previous hemorrhagic area, concerning for solitary melanoma metastasis      10/06/2015 - 09/06/2016 Chemotherapy    Nivolumab 2421mevery 2 weeks, held due to his dexamethasone for radionecrosis       10/13/2015 - 10/13/2015 Radiation Therapy    10/13/2015 Preop SRS treatment: 14 Gy  to the left frontal lesion in 1 fraction      10/15/2015 Surgery    left front craniotomy and tumor excision       10/15/2015 Pathology Results    left frontal brain mass excision showed metastatic melanoma, 1.5X1.5X0.6cm      01/13/2016 Imaging    CT head without and with contrast showed ill-defined enhancement and dural thickening of the left frontal  resection cavity may represent a post treatment changes or residual neoplasm. A new 6 mm hypodensity in the right cerebellar hemisphere with uncertainty enhancement may represent a metastasis or small brain parenchymal hemorrhage.       01/27/2016 - 01/27/2016 Radiation Therapy    01/27/16 SRS Treatment:  20 Gy in 1 fraction to a 6 mm right cerebellar brain met       03/29/2016 PET scan    PET 03/29/2016 IMPRESSION: 1. Overall there has been an interval response to therapy. The index lesions sided on previous exam it are less hypermetabolic when compared with the previous exam. 2. There is an intramuscular lesion within the right masseter which has increased FDG uptake compared with previous exam. 3. No new sites of disease identified. 4. Aortic atherosclerosis and infrarenal abdominal aortic aneurysm. Recommend followup by ultrasound in 3 years. This recommendation follows ACR consensus guidelines: White Paper of the ACR Incidental Findings Committee II on Vascular Findings. J Joellyn Ruedadiol 2013; 10:789-794      04/20/2016 Imaging    CT Head w wo contrast 1. Mild patchy enhancement at the left anterior frontal lobe treatment site (series 11, image 39) without mass effect and stable surrounding white matter hypodensity without strong evidence of acute vasogenic edema. Recommend continued CT surveillance. 2. No new metastatic disease or new intracranial abnormality identified. 3. Probable benign 14  mm left parietal bone lucent lesion, unchanged since May 2017.      05/06/2016 - 05/08/2016 Hospital Admission    Pt was admitted for rectal bleeding for one day. Breathing spontaneously resolved, hemoglobin dropped from 15 to 10.5, did not require blood transfusion. Colonoscopy showed no active bleeding, except diverticulosis and hemorrhoid. He was discharged to home.      05/08/2016 Procedure    Colonoscopy  - Stool in the ascending colon. Fluid aspiration performed. -  Diverticulosis in the entire examined colon. - Non-bleeding internal hemorrhoids. - The examination was otherwise normal on direct and retroflexion views.      05/29/2016 Imaging    PET Image Restaging IMPRESSION: 1. Stable exam with no evidence progression. 2. Photopenia in the LEFT frontal lobe at prior surgical site. 3. Stable mildly hypermetabolic RIGHT paratracheal node. This may represent a thyroid nodule. 4. Stable size of minimally metabolic lingular pulmonary nodule. 5. Stable skeletal lesion of the RIGHT iliac crest 6. No cutaneous metabolic lesion.      07/20/2016 Imaging    CT Head W WO Contrast IMPRESSION: Pronounced change in the left frontal region. Much more regional vasogenic edema. Relatively stable appearance of the abnormal enhancement extending from the lateral margin of the frontal horn of left lateral ventricle to the frontal brain surface. New region of abnormal enhancement surrounding the frontal horn of the left lateral ventricle measuring approximately 3.2 cm. The differential diagnosis is recurrent tumor versus radiation necrosis. The patient is reportedly not an MRI candidate. This area is large enough that CT perfusion could possibly make a reasonable differentiation.      09/02/2016 Imaging    CT Head 09/02/16 IMPRESSION: Somewhat mixed pattern of slight posterior progression of pleomorphic enhancement surrounding the LEFT lateral ventricle although no significant mass effect, and reduced vasogenic edema compared with the scan in April.      09/27/2016 - 11/02/2016 Chemotherapy    Start dabrafenib and trametinib 09/27/16, stopped due to severe fatigue        10/05/2016 Imaging    CT Head w & w/o contrast  IMPRESSION: 1. Decreased size of the enhancing area within the left frontal lobe treatment site. Surrounding edema has also decreased. The findings support continued evolution of radiation necrosis. 2. No new enhancing lesions.       10/18/2016 PET scan    IMPRESSION: 1. New focal hypermetabolism in the proximal sigmoid colon with associated focal colonic wall thickening and mild pericolonic fat stranding at the site of a proximal sigmoid diverticulum, favored to represent mild acute sigmoid diverticulitis. Metastatic disease is considered less likely. Clinical correlation is necessary. Consider appropriate antibiotic therapy, as clinically warranted. Attention to this region recommended on future PET-CT follow-up. 2. Otherwise complete metabolic response with no evidence of hypermetabolic metastatic disease. Lingular pulmonary nodule is slightly decreased in size and demonstrates no significant metabolism. 3. No residual hypermetabolism in the right thyroid lobe nodule, which is stable in size. 4. Aortic Atherosclerosis (ICD10-I70.0). Stable 3.1 cm infrarenal abdominal aortic aneurysm. Aortic aneurysm NOS (ICD10-I71.9). Recommend followup by ultrasound in 3 years. This recommendation follows ACR consensus guidelines: White Paper of the ACR Incidental Findings Committee II on Vascular Findings. Joellyn Rued Radiol 2013; 52:841-324.      11/01/2016 - 11/04/2016 Hospital Admission    Patient admitted to the hospital due to hypercalcemia where he received IV fluids and pamidronate      12/13/2016 PET scan    PET 12/13/16 IMPRESSION: 1. No findings to suggest metastatic  disease on today's examination. 2. Previously noted hypermetabolism in the sigmoid colon has resolved, presumably indicative of a focus of diverticulitis on the prior study. Today's study does demonstrate relatively diffuse hypermetabolism throughout the cecum, ascending colon and proximal transverse colon where there is some mild mural thickening, favored to reflect mild chronic inflammation related to underlying diverticular disease. No definite inflammatory changes on the CT portion of the examination to strongly suggest an active acute diverticulitis  at this time. No discrete lesion to suggest neoplasm. 3. Previously noted 8 mm lingular nodule is stable in size and known remains non hypermetabolic. This is nonspecific but reassuring. Continued attention on future followup studies is recommended. 4. Aortic atherosclerosis, in addition to left main and 3 vessel coronary artery disease. Status post median sternotomy for CABG including LIMA to the LAD. In addition, there is mild fusiform aneurysmal dilatation of the infrarenal abdominal aorta (3.1 cm in diameter) as well as aneurysmal dilatation of the left common iliac      12/18/2016 -  Chemotherapy    Restarted nivolamab 218m every 2 weeks, with tapering dose dexamethasone        04/18/2017 Imaging    PET Scan IMPRESSION: 1. No hypermetabolic lesion to suggest active malignancy. 2. Stable 8 mm lingular nodule is not currently hypermetabolic but merit surveillance. Infrarenal abdominal aortic aneurysm 3.1 cm in diameter. Recommend followup by [facility] 3 years. 3. Other imaging findings of potential clinical significance: Postoperative findings in the left frontal lobe. Right renal cyst. Aortic Atherosclerosis (ICD10-I70.0). Osteoarthritis. Lumbar scoliosis. Left knee effusion. Colonic diverticulosis.        Interval History:  CDickson Kostelnik presents today for follow up.  He has no new complaints today. Gait impairment is stable.  Has next stage of driving evaluation upcoming. He otherwise denies seizures, headaches, neck or back pain.     Medications: Current Outpatient Medications on File Prior to Visit  Medication Sig Dispense Refill  . acetaminophen (TYLENOL) 500 MG tablet Take 500 mg by mouth at bedtime.    .Marland KitchenamLODipine (NORVASC) 5 MG tablet Take 1 tablet (5 mg total) by mouth daily. 90 tablet 3  . aspirin EC 81 MG tablet Take 81 mg by mouth daily.    .Marland Kitchenlevothyroxine (SYNTHROID, LEVOTHROID) 137 MCG tablet Take 1 tablet (137 mcg total) by mouth daily before breakfast. 30 tablet  3  . metoprolol succinate (TOPROL-XL) 50 MG 24 hr tablet Take 1.5 tablets daily (723m. Take with or immediately following a meal. 135 tablet 3  . pentoxifylline (TRENTAL) 400 MG CR tablet Take 1 tablet (400 mg total) by mouth 2 (two) times daily. 60 tablet 6  . rosuvastatin (CRESTOR) 20 MG tablet Take 1 tablet (20 mg total) by mouth at bedtime. 90 tablet 3  . sertraline (ZOLOFT) 100 MG tablet     . testosterone cypionate (DEPOTESTOTERONE CYPIONATE) 100 MG/ML injection Inject 1 mL (100 mg total) into the muscle every 14 (fourteen) days. 10 mL 0  . Turmeric 500 MG CAPS Take 2 capsules by mouth daily.    . vitamin E (VITAMIN E) 400 UNIT capsule Take 1 capsule (400 Units total) by mouth 2 (two) times daily. 60 capsule 6  . fluocinonide cream (LIDEX) 0.05 % Apply topically 2 (two) times daily as needed. (Patient not taking: Reported on 10/16/2017) 30 g 1   Current Facility-Administered Medications on File Prior to Visit  Medication Dose Route Frequency Provider Last Rate Last Dose  . 0.9 %  sodium chloride  infusion  1,000 mL Intravenous Once [name], MD        Allergies:  Allergies  Allergen Reactions  . Ezetimibe   . Simvastatin Other (See Comments)    Reaction:  Gave pt a fever  Fever - temp of 103.   In 2003   Past Medical History:  Past Medical History:  Diagnosis Date  . Anginal pain ([place])   . Arthritis    mostly hands  . Benign familial hematuria   . BPH (benign prostatic hypertrophy)   . Brain cancer ([place])    melanoma with brain met  . Coronary artery disease   . GERD (gastroesophageal reflux disease)   . High cholesterol   . Hypertension   . Hypothyroidism   . Intracerebral hemorrhage ([place])   . Melanoma ([place])   . Metastatic melanoma to lung ([place])   . Mood disorder ([place])   . Myocardial infarction ([place])   . Pacemaker    due to syncope, 3rd degree HB (upgrade to [facility]. Jude CRT-P 02/25/13 (Dr. Uvaldo Rising)  . Rectal bleed    due to NSAIDS  . Renal disorder   . Skin cancer     melanoma  . Sleep apnea    uses CPAP  . Stroke [facility])    Past Surgical History:  Past Surgical History:  Procedure Laterality Date  . APPLICATION OF CRANIAL NAVIGATION N/A 10/15/2015   Procedure: APPLICATION OF CRANIAL NAVIGATION;  Surgeon: Erline Levine, MD;  Location: [place] NEURO ORS;  Service: Neurosurgery;  Laterality: N/A;  . CARDIAC CATHETERIZATION  2013  . CARDIAC SURGERY     bypass X 2  . COLONOSCOPY WITH PROPOFOL N/A 05/08/2016   Procedure: COLONOSCOPY WITH PROPOFOL;  Surgeon: Jonathon Bellows, MD;  Location[facility] ENDOSCOPY;  Service: Gastroenterology;  Laterality: N/A;  . CORONARY ARTERY BYPASS GRAFT  2013   LIMA-LAD, SVG-PDA 03/27/11 (Dr. Francee Gentile)  . CRANIOTOMY N/A 10/15/2015   Procedure: LEFT FRONTAL CRANIOTOMY TUMOR EXCISION with Curve;  Surgeon: Erline Levine, MD;  Location: [place] NEURO ORS;  Service: Neurosurgery;  Laterality: N/A;  CRANIOTOMY TUMOR EXCISION  . EYE SURGERY    . FOOT SURGERY  08/2010  . JOINT REPLACEMENT Left    partial knee  . KNEE ARTHROSCOPY  12/2008  . LYMPH NODE BIOPSY    . PACEMAKER INSERTION    . TONSILLECTOMY     Social History:  Social History   Socioeconomic History  . Marital status: Married    Spouse name: Not on file  . Number of children: 2  . Years of education: Not on file  . Highest education level: Not on file  Occupational History  . Occupation: retired Pharmacist, community  Social Needs  . Financial resource strain: Not on file  . Food insecurity:    Worry: Not on file    Inability: Not on file  . Transportation needs:    Medical: Not on file    Non-medical: Not on file  Tobacco Use  . Smoking status: Former Smoker    Packs/day: 1.00    Years: 3.00    Pack years: 3.00    Last attempt to quit: 04/10/1957    Years since quitting: 60.5  . Smokeless tobacco: Never Used  Substance and Sexual Activity  . Alcohol use: Yes    Alcohol/week: 24.0 oz    Types: 30 Glasses of wine, 10 Cans of beer per week    Comment: 10 per week, wine per week  . Drug  use: No  . Sexual activity: Never  Lifestyle  . Physical activity:    Days per week: Not on file    Minutes per session: Not on file  . Stress: Not on file  Relationships  . Social connections:    Talks on phone: Not on file    Gets together: Not on file    Attends religious service: Not on file    Active member of club or organization: Not on file    Attends meetings of clubs or organizations: Not on file    Relationship status: Not on file  . Intimate partner violence:    Fear of current or ex partner: Not on file    Emotionally abused: Not on file    Physically abused: Not on file    Forced sexual activity: Not on file  Other Topics Concern  . Not on file  Social History Narrative   Lives with wife [name]) with dementia in [place] [facility]   Daughter [name] and son   Occ: retired Pharmacist, community practiced in [place]   Has living will   [name] is health care POA   Requests DNR-- done 09/14/15   Not sure about tube feeds   Family History:  Family History  Problem Relation Age of Onset  . Cancer Mother 49       lung   . Hypertension Mother   . Hypertension Father   . Heart attack Father   . Heart disease Father   . Rheum arthritis Sister   . Cancer Maternal Grandmother     Review of Systems: Constitutional: Denies fevers, chills or abnormal weight loss Eyes: Denies blurriness of vision Ears, nose, mouth, throat, and face: Denies mucositis or sore throat Respiratory: Denies cough, dyspnea or wheezes Cardiovascular: Denies palpitation, chest discomfort or lower extremity swelling Gastrointestinal:  Denies nausea, constipation, diarrhea GU: Denies dysuria or incontinence Skin: Denies abnormal skin rashes Neurological: Per HPI Musculoskeletal: Denies joint pain, back or neck discomfort. No decrease in ROM Behavioral/Psych: Denies anxiety, disturbance in thought content, and mood instability   Physical Exam: Vitals:   10/16/17 1238  BP: (!) 129/93    Pulse: 75  Resp: 17  Temp: 97.7 F (36.5 C)  SpO2: 95%   KPS: 70. General: Alert, cooperative, pleasant, in no acute distress Head: Craniotomy scar noted, dry and intact. EENT: No conjunctival injection or scleral icterus. Oral mucosa moist Lungs: Resp effort normal Cardiac: Regular rate and rhythm Abdomen: Soft, non-distended abdomen Skin: No rashes cyanosis or petechiae. Extremities: No clubbing or edema  Neurologic Exam: Mental Status: Awake, alert, attentive to examiner. Oriented to self and environment. Language is fluent with intact comprehension.  Age advanced psychomotor slowing.  Limited insight at times.  Cranial Nerves: Visual acuity is grossly normal. Visual fields are full. Extra-ocular movements intact. No ptosis. Face is symmetric, tongue midline. Motor: Tone and bulk are normal. Power is impaired to 4+/5 right leg, flexors>extendors. Reflexes are symmetric, no pathologic reflexes present. Intact finger to nose bilaterally Sensory: Intact to light touch and temperature Gait: Mildly hemiparetic   Labs: I have reviewed the data as listed    Component Value Date/Time   NA 145 10/08/2017 0854   NA 140 03/26/2017 0928   K 4.7 10/08/2017 0854   K 4.7 03/26/2017 0928   CL 111 10/08/2017 0854   CO2 26 10/08/2017 0854   CO2 21 (L) 03/26/2017 0928   GLUCOSE 136 (H) 10/08/2017 0854   GLUCOSE 95 03/26/2017 0928   BUN 28 (H) 10/08/2017 7371  BUN 19.3 03/26/2017 0928   CREATININE 1.36 (H) 10/08/2017 0854   CREATININE 1.2 03/26/2017 0928   CALCIUM 9.5 10/08/2017 0854   CALCIUM 8.7 03/26/2017 0928   PROT 6.6 10/08/2017 0854   PROT 6.8 03/26/2017 0928   ALBUMIN 3.9 10/08/2017 0854   ALBUMIN 3.7 03/26/2017 0928   AST 22 10/08/2017 0854   AST 22 03/26/2017 0928   ALT 23 10/08/2017 0854   ALT 15 03/26/2017 0928   ALKPHOS 56 10/08/2017 0854   ALKPHOS 58 03/26/2017 0928   BILITOT 0.5 10/08/2017 0854   BILITOT 0.58 03/26/2017 0928   GFRNONAA 48 (L) 10/08/2017 0854    GFRAA 56 (L) 10/08/2017 0854   Lab Results  Component Value Date   WBC 5.0 10/08/2017   NEUTROABS 3.8 10/08/2017   HGB 15.1 10/08/2017   HCT 45.4 10/08/2017   MCV 94.4 10/08/2017   PLT 148 10/08/2017     Imaging:  [place] Clinician Interpretation: I have personally reviewed the CNS images as listed.  My interpretation, in the context of the patient's clinical presentation, is stable disease  Ct Head W Wo Contrast  Result Date: 10/10/2017 CLINICAL DATA:  Melanoma with brain Mets.  Continued surveillance. EXAM: CT HEAD WITHOUT AND WITH CONTRAST TECHNIQUE: Contiguous axial images were obtained from the base of the skull through the vertex without and with intravenous contrast CONTRAST:  148m ISOVUE-300 IOPAMIDOL (ISOVUE-300) INJECTION 61% COMPARISON:  06/08/2017. FINDINGS: Brain: Postsurgical/post treatment changes in the LEFT frontal lobe, with edema/gliosis/encephalomalacia, compensatory enlargement of the LEFT frontal horn of the ventricle, and stable curvilinear enhancement (series 4 image 16-17). No signs of recurrence. Overall improved appearance of the RIGHT cerebellar metastasis. There is less surrounding edema. The lesion is now ovoid and smaller, 4 x 5 mm cross-section as seen on post infusion imaging. No new lesions. Generalized atrophy. Hypoattenuation of white matter, greatest in the LEFT frontal lobe. No midline shift. Vascular: Calcification of the cavernous internal carotid arteries consistent with cerebrovascular atherosclerotic disease. No signs of intracranial large vessel occlusion. Visible vessels are patent. Skull: Benign appearing LEFT parietal bone lesion, stable from 2017. Unremarkable LEFT frontal craniotomy. Sinuses/Orbits: No paranasal sinus disease. BILATERAL ocular surgery appears uncomplicated. Other: No middle ear or mastoid fluid. IMPRESSION: Stable post treatment appearance LEFT frontal lobe. Overall improved RIGHT cerebellar metastasis, decreased size and edema.  Electronically Signed   By: JStaci RighterM.D.   On: 10/10/2017 12:53   Nm Pet Image Restage (ps) Whole Body  Result Date: 10/10/2017 CLINICAL DATA:  Subsequent treatment strategy for metastatic melanoma. EXAM: NUCLEAR MEDICINE PET WHOLE BODY TECHNIQUE: 9.8 mCi F-18 FDG was injected intravenously. Full-ring PET imaging was performed from the skull base to thigh after the radiotracer. CT data was obtained and used for attenuation correction and anatomic localization. Fasting blood glucose: 66 mg/dl COMPARISON:  04/18/2017 FINDINGS: Mediastinal blood pool activity: SUV max 2.5 HEAD/NECK: No hypermetabolic activity in the scalp. No hypermetabolic cervical lymph nodes. Incidental CT findings: none CHEST: No hypermetabolic mediastinal or hilar nodes. No suspicious pulmonary nodules on the CT scan. 8 mm lingular nodule is stable without evidence for hypermetabolism. Incidental CT findings: none ABDOMEN/PELVIS: No abnormal hypermetabolic activity within the liver, pancreas, adrenal glands, or spleen. No hypermetabolic lymph nodes in the abdomen or pelvis. Small focus of subtle hypermetabolism identified in the subcutaneous fat of the right gluteal region (4:220) without a discrete underlying nodule evident. There is some hazy soft tissue attenuation in the fat of this region which may be related to an  injection site or trauma. Incidental CT findings: Excreted contrast material noted in both kidneys from CT neck earlier today. Small cysts are noted in the kidneys bilaterally. Infrarenal abdominal aorta measures up to 3.3 cm diameter. Advanced diverticular changes noted in the left colon. SKELETON: No focal hypermetabolic activity to suggest skeletal metastasis. Incidental CT findings: none EXTREMITIES: No abnormal hypermetabolic activity in the lower extremities. Incidental CT findings: none IMPRESSION: 1. Small focus of FDG accumulation in the subcutaneous fat of the right gluteal region, nonspecific. Attention on  follow-up suggested. 2. Otherwise stable exam without new or progressive hypermetabolic disease in the interval since prior PET-CT. 3. Stable 8 mm nodule in the lingula. No evidence for hypermetabolism on PET imaging. 4.  Aortic Atherosclerois (ICD10-170.0) Electronically Signed   By: Misty Stanley M.D.   On: 10/10/2017 16:30      Assessment/Plan 1. Brain metastases ([place])  2. Cognitive Impairment  Mr. Minton is clinically and radiographically stable today.    At this time we recommend returning to clinic in 4 months with a repeat CT head with and without contrast for evaluation.    The case at that time will be reviewed again in multidisciplinary brain tumor board.   He will continue to follow with Dr. Burr Medico for his systemic therapy.  We appreciate the opportunity to participate in the care of Brittain Hosie..   All questions were answered. The patient knows to call the clinic with any problems, questions or concerns. No barriers to learning were detected.  The total time spent in the encounter was 25 minutes and more than 50% was on counseling and review of test results   Ventura Sellers, MD Medical Director of Neuro-Oncology [name] at [place] 10/16/17 12:47 PM

## 2017-10-16 NOTE — Telephone Encounter (Signed)
Appointments scheduled AVS/Calendar printed per 7/9 los

## 2017-10-19 NOTE — Progress Notes (Signed)
[facility]  [phone] [phone]  Clinic Follow up Note   Patient Care Team: [name], MD as PCP - General (Family Medicine)   Date of Service:  10/22/2017  Chief complaint: Follow-up metastatic melanoma   Oncology History   Metastatic melanoma [facility])   Staging form: Melanoma of the Skin, AJCC 7th Edition     Clinical stage from 09/21/2015: Stage IV [place], [place], [place]) - Signed by [name], MD on 10/09/2015       Malignant melanoma ([place])   09/16/2015 Imaging    PET scan showed a hypermetabolic 8.3GP lingular nodule, and a 79m right upper lobe lung nodule, left subclavicular nodes, and bone metastasis in the proximal right humerus and right femur.       09/21/2015 Initial Diagnosis    [place]astatic [place]      09/21/2015 Initial Biopsy    Left subclavicular lymph node biopsy showed prostatic of melanoma      09/21/2015 Miscellaneous    BRAF V600K mutation (+)       10/04/2015 Imaging    CT head with without contrast showed a 2.0 x 1.6 x 1.7 cm superficial left frontal hyperdense lesion, with postcontrast enhancement, lying within the previous hemorrhagic area, concerning for solitary melanoma metastasis      10/06/2015 - 09/06/2016 Chemotherapy    Nivolumab [id number] 2 weeks, held due to his dexamethasone for radionecrosis       10/13/2015 - 10/13/2015 Radiation Therapy    10/13/2015 Preop SRS treatment: 14 Gy  to the left frontal lesion in 1 fraction      10/15/2015 Surgery    left front craniotomy and tumor excision       10/15/2015 Pathology Results    left frontal brain mass excision showed metastatic melanoma, 1.5X1.5X0.6cm      01/13/2016 Imaging    CT head without and with contrast showed ill-defined enhancement and dural thickening of the left frontal resection cavity may represent a post treatment changes or residual neoplasm. A new 6 mm hypodensity in the right cerebellar hemisphere with uncertainty enhancement may  represent a metastasis or small brain parenchymal hemorrhage.       01/27/2016 - 01/27/2016 Radiation Therapy    01/27/16 SRS Treatment:  20 Gy in 1 fraction to a 6 mm right cerebellar brain met       03/29/2016 PET scan    PET 03/29/2016 IMPRESSION: 1. Overall there has been an interval response to therapy. The index lesions sided on previous exam it are less hypermetabolic when compared with the previous exam. 2. There is an intramuscular lesion within the right masseter which has increased FDG uptake compared with previous exam. 3. No new sites of disease identified. 4. Aortic atherosclerosis and infrarenal abdominal aortic aneurysm. Recommend followup by ultrasound in 3 years. This recommendation follows ACR consensus guidelines: White Paper of the ACR Incidental Findings Committee II on Vascular Findings. J Joellyn Ruedadiol 2013; 10:789-794      04/20/2016 Imaging    CT Head w wo contrast 1. Mild patchy enhancement at the left anterior frontal lobe treatment site (series 11, image 39) without mass effect and stable surrounding white matter hypodensity without strong evidence of acute vasogenic edema. Recommend continued CT surveillance. 2. No new metastatic disease or new intracranial abnormality identified. 3. Probable benign 14 mm left parietal bone lucent lesion, unchanged since May 2017.      05/06/2016 - 05/08/2016 Hospital Admission    Pt was admitted for  rectal bleeding for one day. Breathing spontaneously resolved, hemoglobin dropped from 15 to 10.5, did not require blood transfusion. Colonoscopy showed no active bleeding, except diverticulosis and hemorrhoid. He was discharged to home.      05/08/2016 Procedure    Colonoscopy  - Stool in the ascending colon. Fluid aspiration performed. - Diverticulosis in the entire examined colon. - Non-bleeding internal hemorrhoids. - The examination was otherwise normal on direct and retroflexion views.      05/29/2016  Imaging    PET Image Restaging IMPRESSION: 1. Stable exam with no evidence progression. 2. Photopenia in the LEFT frontal lobe at prior surgical site. 3. Stable mildly hypermetabolic RIGHT paratracheal node. This may represent a thyroid nodule. 4. Stable size of minimally metabolic lingular pulmonary nodule. 5. Stable skeletal lesion of the RIGHT iliac crest 6. No cutaneous metabolic lesion.      07/20/2016 Imaging    CT Head W WO Contrast IMPRESSION: Pronounced change in the left frontal region. Much more regional vasogenic edema. Relatively stable appearance of the abnormal enhancement extending from the lateral margin of the frontal horn of left lateral ventricle to the frontal brain surface. New region of abnormal enhancement surrounding the frontal horn of the left lateral ventricle measuring approximately 3.2 cm. The differential diagnosis is recurrent tumor versus radiation necrosis. The patient is reportedly not an MRI candidate. This area is large enough that CT perfusion could possibly make a reasonable differentiation.      09/02/2016 Imaging    CT Head 09/02/16 IMPRESSION: Somewhat mixed pattern of slight posterior progression of pleomorphic enhancement surrounding the LEFT lateral ventricle although no significant mass effect, and reduced vasogenic edema compared with the scan in April.      09/27/2016 - 11/02/2016 Chemotherapy    Start dabrafenib and trametinib 09/27/16, stopped due to severe fatigue        10/05/2016 Imaging    CT Head w & w/o contrast  IMPRESSION: 1. Decreased size of the enhancing area within the left frontal lobe treatment site. Surrounding edema has also decreased. The findings support continued evolution of radiation necrosis. 2. No new enhancing lesions.      10/18/2016 PET scan    IMPRESSION: 1. New focal hypermetabolism in the proximal sigmoid colon with associated focal colonic wall thickening and mild pericolonic fat stranding at  the site of a proximal sigmoid diverticulum, favored to represent mild acute sigmoid diverticulitis. Metastatic disease is considered less likely. Clinical correlation is necessary. Consider appropriate antibiotic therapy, as clinically warranted. Attention to this region recommended on future PET-CT follow-up. 2. Otherwise complete metabolic response with no evidence of hypermetabolic metastatic disease. Lingular pulmonary nodule is slightly decreased in size and demonstrates no significant metabolism. 3. No residual hypermetabolism in the right thyroid lobe nodule, which is stable in size. 4. Aortic Atherosclerosis (ICD10-I70.0). Stable 3.1 cm infrarenal abdominal aortic aneurysm. Aortic aneurysm NOS (ICD10-I71.9). Recommend followup by ultrasound in 3 years. This recommendation follows ACR consensus guidelines: White Paper of the ACR Incidental Findings Committee II on Vascular Findings. Joellyn Rued Radiol 2013; 68:341-962.      11/01/2016 - 11/04/2016 Hospital Admission    Patient admitted to the hospital due to hypercalcemia where he received IV fluids and pamidronate      12/13/2016 PET scan    PET 12/13/16 IMPRESSION: 1. No findings to suggest metastatic disease on today's examination. 2. Previously noted hypermetabolism in the sigmoid colon has resolved, presumably indicative of a focus of diverticulitis on the prior study. Today's study  does demonstrate relatively diffuse hypermetabolism throughout the cecum, ascending colon and proximal transverse colon where there is some mild mural thickening, favored to reflect mild chronic inflammation related to underlying diverticular disease. No definite inflammatory changes on the CT portion of the examination to strongly suggest an active acute diverticulitis at this time. No discrete lesion to suggest neoplasm. 3. Previously noted 8 mm lingular nodule is stable in size and known remains non hypermetabolic. This is nonspecific but  reassuring. Continued attention on future followup studies is recommended. 4. Aortic atherosclerosis, in addition to left main and 3 vessel coronary artery disease. Status post median sternotomy for CABG including LIMA to the LAD. In addition, there is mild fusiform aneurysmal dilatation of the infrarenal abdominal aorta (3.1 cm in diameter) as well as aneurysmal dilatation of the left common iliac      12/18/2016 -  Chemotherapy    Restarted nivolamab '240mg'$  every 2 weeks, with tapering dose dexamethasone        04/18/2017 Imaging    PET Scan IMPRESSION: 1. No hypermetabolic lesion to suggest active malignancy. 2. Stable 8 mm lingular nodule is not currently hypermetabolic but merit surveillance. Infrarenal abdominal aortic aneurysm 3.1 cm in diameter. Recommend followup by [place] in 3 years. 3. Other imaging findings of potential clinical significance: Postoperative findings in the left frontal lobe. Right renal cyst. Aortic Atherosclerosis (ICD10-I70.0). Osteoarthritis. Lumbar scoliosis. Left knee effusion. Colonic diverticulosis.       10/10/2017 PET scan    IMPRESSION: 1. Small focus of FDG accumulation in the subcutaneous fat of the right gluteal region, nonspecific. Attention on follow-up suggested. 2. Otherwise stable exam without new or progressive hypermetabolic disease in the interval since prior PET-CT. 3. Stable 8 mm nodule in the lingula. No evidence for hypermetabolism on PET imaging. 4.  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 He was found to have left frontal hemorrhage 5.7 cm x 4.5 cm with 6 mm left to right shift.During the workup for his stroke, a left upper lung mass was noticed on the CT scan. I was called to evaluate his lung mass.  Patient has aphasia, not able to communicate much. According to his  daughter, Dr. [name], who is a OB GYN at [employer], he probably only smoked for very short period time during the college. He did have 3 skin melanoma, which was removed, last one was 2-3 years ago. No other cancer history.  CURRENT THERAPY:  Restarted nivolamab on 12/18/2016 and changed to every 4 weeks on 01/30/2017, changed back to every 2 weeks due to fatigue on 04/23/17  INTERVAL HISTORY:   David [name]. returns for follow-up on treatment. He presents to the clinic today by himself. He notes he is overall stale and notes he cannot exercise as much. He was playing in the pool with his granddaughter which exhausted him. He notes he can walk 1/4 mile before needing to stop. He notes he could be doing more on stationary bike. He notes he has balance issues with no falls. He does not ambulate with cane. He has shower chair but does not use it.  He denies any new pain or buttocks pain or skin boil, but notes recent body ache on his buttocks that lasted for 2-3 weeks and continues to improve. He notes he is eating well and  denies cough, chest discomfort.    REVIEW OF SYSTEMS:   Constitutional: Denies fevers, chills or abnormal weight loss (+) fatigue  Eyes: Denies blurriness of vision Ears, nose, mouth, throat, and face: Denies mucositis or sore throat Respiratory: Denies cough, dyspnea or wheezes Cardiovascular: Denies palpitation, chest discomfort or lower extremity swelling Gastrointestinal:  Denies nausea, heartburn, hematochezia  Skin: Negative. Lymphatics: Denies new lymphadenopathy or easy bruising Neurological: (+) trouble processing short and long term memory (+) balance issues form leg weakness, no falls  MSK: (+) Arthritis pain Behavioral/Psych: Mood is stable, no new changes  All other systems were reviewed with the patient and are negative.  MEDICAL HISTORY:  Past Medical History:  Diagnosis Date  . Anginal pain ([place])   . Arthritis    mostly hands  . Benign  familial hematuria   . BPH (benign prostatic hypertrophy)   . Brain cancer ([place])    melanoma with brain met  . Coronary artery disease   . GERD (gastroesophageal reflux disease)   . High cholesterol   . Hypertension   . Hypothyroidism   . Intracerebral hemorrhage ([place])   . Melanoma ([place])   . Metastatic melanoma to lung ([place])   . Mood disorder ([place])   . Myocardial infarction ([place])   . Pacemaker    due to syncope, 3rd degree HB (upgrade to [facility]. Jude CRT-P 02/25/13 (Dr. Uvaldo Rising)  . Rectal bleed    due to NSAIDS  . Renal disorder   . Skin cancer    melanoma  . Sleep apnea    uses CPAP  . Stroke [facility])     SURGICAL HISTORY: Past Surgical History:  Procedure Laterality Date  . APPLICATION OF CRANIAL NAVIGATION N/A 10/15/2015   Procedure: APPLICATION OF CRANIAL NAVIGATION;  Surgeon: Erline Levine, MD;  Location: [facility]S;  Service: Neurosurgery;  Laterality: N/A;  . CARDIAC CATHETERIZATION  2013  . CARDIAC SURGERY     bypass X 2  . COLONOSCOPY WITH PROPOFOL N/A 05/08/2016   Procedure: COLONOSCOPY WITH PROPOFOL;  Surgeon: Jonathon Bellows, MD;  Location[facility] ENDOSCOPY;  Service: Gastroenterology;  Laterality: N/A;  . CORONARY ARTERY BYPASS GRAFT  2013   LIMA-LAD, SVG-PDA 03/27/11 (Dr. Francee Gentile)  . CRANIOTOMY N/A 10/15/2015   Procedure: LEFT FRONTAL CRANIOTOMY TUMOR EXCISION with Curve;  Surgeon: Erline Levine, MD;  Location: [facility] NEURO ORS;  Service: Neurosurgery;  Laterality: N/A;  CRANIOTOMY TUMOR EXCISION  . EYE SURGERY    . FOOT SURGERY  08/2010  . JOINT REPLACEMENT Left    partial knee  . KNEE ARTHROSCOPY  12/2008  . LYMPH NODE BIOPSY    . PACEMAKER INSERTION    . TONSILLECTOMY      I have reviewed the social history and family history with the patient and they are unchanged from previous note.  ALLERGIES:  is allergic to ezetimibe and simvastatin.  MEDICATIONS:  Current Outpatient Medications  Medication Sig Dispense Refill  . acetaminophen (TYLENOL) 500 MG tablet Take 500 mg  by mouth at bedtime.    Marland Kitchen amLODipine (NORVASC) 5 MG tablet Take 1 tablet (5 mg total) by mouth daily. 90 tablet 3  . aspirin EC 81 MG tablet Take 81 mg by mouth daily.    . fluocinonide cream (LIDEX) 0.05 % Apply topically 2 (two) times daily as needed. (Patient not taking: Reported on 10/16/2017) 30 g 1  . levothyroxine (SYNTHROID, LEVOTHROID) 137 MCG tablet Take 1 tablet (137 mcg total) by mouth daily before breakfast. 30 tablet 3  .  metoprolol succinate (TOPROL-XL) 50 MG 24 hr tablet Take 1.5 tablets daily (67m). Take with or immediately following a meal. 135 tablet 3  . pentoxifylline (TRENTAL) 400 MG CR tablet Take 1 tablet (400 mg total) by mouth 2 (two) times daily. 60 tablet 6  . rosuvastatin (CRESTOR) 20 MG tablet Take 1 tablet (20 mg total) by mouth at bedtime. 90 tablet 3  . sertraline (ZOLOFT) 100 MG tablet     . testosterone cypionate (DEPOTESTOTERONE CYPIONATE) 100 MG/ML injection Inject 1 mL (100 mg total) into the muscle every 14 (fourteen) days. 10 mL 0  . Turmeric 500 MG CAPS Take 2 capsules by mouth daily.    . vitamin E (VITAMIN E) 400 UNIT capsule Take 1 capsule (400 Units total) by mouth 2 (two) times daily. 60 capsule 6   No current facility-administered medications for this visit.    Facility-Administered Medications Ordered in Other Visits  Medication Dose Route Frequency Provider Last Rate Last Dose  . 0.9 %  sodium chloride infusion  1,000 mL Intravenous Once FTruitt Merle MD        PHYSICAL EXAMINATION:  ECOG PERFORMANCE STATUS: 2 BP (!) 125/95 (BP Location: Left Arm, Patient Position: Sitting) Comment: dr fBurr [place] of bp  Pulse 75   Temp 97.8 F (36.6 C) (Oral)   Resp 17   Ht _0  (1.753 m)   Wt 202 lb 9.6 oz (91.9 kg)   SpO2 96%   BMI 29.92 kg/m    GENERAL:alert, no distress and comfortable SKIN: Significant skin thickening below his right big toe with mild ecchymosis. Other skin color, texture, turgor are normal.  EYES: normal, Conjunctiva are pink and  non-injected, sclera clear OROPHARYNX:no exudate, no erythema and lips, buccal mucosa, and tongue normal  NECK: supple, thyroid normal size, non-tender, without nodularity LYMPH:  no palpable lymphadenopathy in the cervical, [place], axillary or inguinal LUNGS: clear to auscultation and percussion with normal breathing effort HEART: regular rate & rhythm and no murmurs and no lower extremity edema ABDOMEN:abdomen soft, non-tender and normal bowel sounds Musculoskeletal:no cyanosis of digits and no clubbing    LABORATORY DATA:  I have reviewed the data as listed CBC Latest Ref Rng & Units 10/22/2017 10/08/2017 09/24/2017  WBC 4.0 - 10.3 K/uL 4.3 5.0 4.8  Hemoglobin 13.0 - 17.1 g/dL 15.5 15.1 14.9  Hematocrit 38.4 - 49.9 % 46.0 45.4 44.5  Platelets 140 - 400 K/uL 159 148 145     CMP Latest Ref Rng & Units 10/08/2017 09/24/2017 09/10/2017  Glucose 70 - 99 mg/dL 136(H) 108 122  BUN 8 - 23 mg/dL 28(H) 32(H) 27(H)  Creatinine 0.61 - 1.24 mg/dL 1.36(H) 1.50(H) 1.31(H)  Sodium 135 - 145 mmol/L 145 140 139  Potassium 3.5 - 5.1 mmol/L 4.7 4.8 5.0  Chloride 98 - 111 mmol/L 111 108 110(H)  CO2 22 - 32 mmol/L 26 24 21(L)  Calcium 8.9 - 10.3 mg/dL 9.5 9.2 8.7  Total Protein 6.5 - 8.1 g/dL 6.6 6.7 6.8  Total Bilirubin 0.3 - 1.2 mg/dL 0.5 0.5 0.4  Alkaline Phos 38 - 126 U/L 56 55 58  AST 15 - 41 U/L _1 ALT 0 - 44 U/L _2 PATHOLOGY REPORT:   Diagnosis 07/16/17  Skin , left medial knee SPONGIOTIC PSORIASIFORM DERMATITIS, SEE DESCRIPTION Microscopic Comment There is a spongiotic and psoriasiform tissue reaction involving the epidermis with an underlying mononuclear cell infiltrate in which eosinophils are not conspicuous. This pattern can  be seen in a chronic eczematous reaction such as a chronic contact dermatitis, id reaction, or nummular eczema. A special stain (PAS-F) is performed to determine the presence or absence of fungal hyphae in this biopsy. The PAS stain is negative for  fungal hyphae, and the controls stained appropriately. The slides were also reviewed by Dr. Andria Frames, who is in general agreement with the diagnosis.   Diagnosis 09/21/2015 Lymph node, needle/core biopsy, hypermetabolic left sub clavicular LN - METASTATIC MALIGNANT MELANOMA. - SEE COMMENT.  Diagnosis 10/15/2015 Brain, for tumor resection, Left frontal - METASTATIC MELANOMA.     PROCEDURES: Colonoscopy 05/08/16 - Stool in the ascending colon. Fluid aspiration performed. - Diverticulosis in the entire examined colon. - Non-bleeding internal hemorrhoids. - The examination was otherwise normal on direct and retroflexion views.  RADIOGRAPHIC STUDIES: I have personally reviewed the radiological images as listed and agreed with the findings in the report. No results found.    PET 10/10/17 IMPRESSION: 1. Small focus of FDG accumulation in the subcutaneous fat of the right gluteal region, nonspecific. Attention on follow-up suggested. 2. Otherwise stable exam without new or progressive hypermetabolic disease in the interval since prior PET-CT. 3. Stable 8 mm nodule in the lingula. No evidence for hypermetabolism on PET imaging. 4.  Aortic Atherosclerois (ICD10-170.0)    CT Head with and without contrast 11/27/2016 IMPRESSION: 1. Continued evolution of post radiation changes in the left frontal lobe with further decrease in the area of contrast enhancement and adjacent edema. 2. No new contrast-enhancing lesions.  PET 10/18/16 IMPRESSION: 1. New focal hypermetabolism in the proximal sigmoid colon with associated focal colonic wall thickening and mild pericolonic fat stranding at the site of a proximal sigmoid diverticulum, favored to represent mild acute sigmoid diverticulitis. Metastatic disease is considered less likely. Clinical correlation is necessary. Consider appropriate antibiotic therapy, as clinically warranted. Attention to this region recommended on future PET-CT  follow-up. 2. Otherwise complete metabolic response with no evidence of hypermetabolic metastatic disease. Lingular pulmonary nodule is slightly decreased in size and demonstrates no significant metabolism. 3. No residual hypermetabolism in the right thyroid lobe nodule, which is stable in size. 4. Aortic Atherosclerosis (ICD10-I70.0). Stable 3.1 cm infrarenal abdominal aortic aneurysm. Aortic aneurysm NOS (ICD10-I71.9). Recommend followup by ultrasound in 3 years. This recommendation follows ACR consensus guidelines: White Paper of the ACR Incidental Findings Committee II on Vascular Findings. J Am Coll Radiol 2013;   CT Head W and WO Contrast 10/05/16 IMPRESSION: 1. Decreased size of the enhancing area within the left frontal lobe treatment site. Surrounding edema has also decreased. The findings support continued evolution of radiation necrosis. 2. No new enhancing lesions.  CT Head 09/02/16 IMPRESSION: Somewhat mixed pattern of slight posterior progression of pleomorphic enhancement surrounding the LEFT lateral ventricle although no significant mass effect, and reduced vasogenic edema compared with the scan in April.  CT Head W WO Contrast 2016-08-03 IMPRESSION: Pronounced change in the left frontal region. Much more regional vasogenic edema. Relatively stable appearance of the abnormal enhancement extending from the lateral margin of the frontal horn of left lateral ventricle to the frontal brain surface. New region of abnormal enhancement surrounding the frontal horn of the left lateral ventricle measuring approximately 3.2 cm. The differential diagnosis is recurrent tumor versus radiation necrosis. The patient is reportedly not an MRI candidate. This area is large enough that CT perfusion could possibly make a reasonable differentiation.  PET Image Restage 05/29/16 IMPRESSION: 1. Stable exam with no evidence progression. 2. Photopenia in the  LEFT frontal lobe at prior  surgical site. 3. Stable mildly hypermetabolic RIGHT paratracheal node. This may represent a thyroid nodule. 4. Stable size of minimally metabolic lingular pulmonary nodule. 5. Stable skeletal lesion of the RIGHT iliac crest 6. No cutaneous metabolic lesion.   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   1. Metastatic melanoma to lung, node, bone and brain, stage IV, BRAF V600K mutation (+) -I previously  reviewed his PET/CT scan images with pt and his family members. The scan revealed hypermetabolic left upper lobe lung mass, 2 small right lung nodules, probable bone metastasis, and a subcutaneous left supraclavicular lesion, which is not palpable  -His repeat CT had showed a 2.0 cm left frontal hyperdense lesion with enhancement on contrast, in the area of his recent hemorrhagic stroke, highly suspicious for brain metastasis.  -We previously discussed his left supraclavicular node biopsy results, which showed metastatic melanoma.. -His case was discussed in our sarcoma tumor board. -I previously recommended a first-line treatment with Nivolamab alone, given his recent stroke, low tumor burden. I'll reserve BRAF inhibitor with or without MEK inhibitor as a second line therapy if he progress on immunotherapy. Other options of immunotherapy, especially checkpoint inhibitor with ipilumimab, were also discussed with pt and his daughter -He is on first line therapy with Nivolumab, has been tolerating Nivolumab well, no significant side effects except fatigue. It was held from 09/06/16 to 12/18/2016 due to his chronic steroids use -He has had near complete metabolic response on the recent restaging PET scan in July 2018. -We'll continue Delton See monthly for his bone metastasis. -He has recently developed neurological symptoms secondary to his radionecrosis in May  2018, on tapering dose of dexa  -He tried dabrafenib and trametinib when he was off Nivo but tolerated poorly, stopped on 11/01/2016. -PET can 12/13/16 shows no evidence of disease. He has achieved a radiographic complete response.  -He has restarted Nivolamab on 12/18/16 and changed to every 4 weeks on 01/30/17. I plan to continue Nivolumab for total of 2 years if he remains to be NED on scan -He finish tapering steroids on 10/32/18, he has tolerated this well.  -I previously encouraged him to be active with walking or the gym since he has completed physical therapy, his daughter has been encouraging to use his pool in his facility.  -I discussed his residual muscle atrophy from previous steroid use can be helped with PT and being active with exercise, which I encourage him to do  -PET scan on 04/18/2017 again showed complete radiographic metabolic response with only a residual 80m left lung nodule without any FDG uptake. Discussed results with pt and he is pleased  -Due to his fatigue after treatment, will continue his Nivolumab every 2 weeks  -If he remains to be in complete response by September 2019, I will stop his nivolumab after a total of 2 years treatment. -We discussed his 10/10/17 PET which shows stable with no disease progression. There is incidental finding of soft tissue uptake in the right gluteal region. Pt notes recent body ache in that location, but exam that area showed no skin change, palpable mass, tenderness or other significant findings. Will monitor. -I discussed he continues to have good response to treatment. Will continue Nivolumab and plan to stop in 12/2017 to complete 2 years of treatment.  -I again encouraged him to continue exercising with stationary bike 1-2  times a day and rest as needed and remain active. He agrees to try  -He is clinically doing well, no symptoms, except memory loss and cognitive dysfunction, able to do self-care at home. Physical exam unremarkable.  -Labs  reviewed today, CBC WNL, overall adequate for continuing with Nivolumab and Xgeva today.  -F/u in 4 weeks   2. Skin rash- Pruritic maculopapular rash to back, arms, hands; scaly plaque rash to legs -Skin rash on left medial leg was biopsied, path revealed spongiotic psoriasiform dermatitis, no evidence of fungal infection. Stains suggest chronic eczematous reaction such as a chronic contact dermatitis, ID reaction, or nummular eczema.  -I believe this is related to Nivolumab. The rash improved dramatically with lidex cream. -I previously recommended he continue lidex; I reviewed risk of possible flare in the future -Rash 95% resolved, he no longer uses cream. If his rash progresses or returns I encouraged him to restart Lidex again.    3. History of hemorrhagic stroke and brain metastasis  -I encouraged him to continue physical therapy and occupational therapy. 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 However his clinical neurological function has deteriorated -I have previously discussed with our neuro-oncologist Dr. 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 Mickeal Skinner, his radionecrosis has improved, no other new lesions. He was also seen by Dr. Mickeal Skinner who feels he is recovering well from neurological standpoint -I tapered his steroids until he stopped altogether by 01/29/17 -He will f/u with Dr. Mickeal Skinner.  4. 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 Mickeal Skinner to discuss treatment options to improve his neurologic function, patient is very frustrated about this, which significantly impacting his quality of life. -continue PT/OT  -CT Head 11/27/2016 showed continued evolution of post radiation changes in the left frontal lobe with further decrease in the area of contrast enhancement and adjacent edema. No new contrast-enhancing lesions.  -The patient previously reviewed these results with neuro-oncologist, Dr. Mickeal Skinner.   5.  Hypercalcemia and CKD stage III -Possibly related to his underlying malignancy (bone mets) and missing one dose of Xgeva. He has restarted Xgeva monthly on 11/10/16.   -I advised him to eat less cheese and cut out milk and dairy products as possible -I previously encouraged him to drink water adequately and avoid NSAIDs   6. Fatigue, hypothyroidism and hypotestosteronemia -He has baseline hypothyroidism and hypotestosteronemia -His fatigue has much improved since he restarted testosterone injection by his primary care physician -His TSH level have been slightly fluctuating, possibly partially related to Nivolumab -He will follow-up with his primary care physician for his Synthroid dose adjustment    7. HTN   -he is on amlodipine and carvedilol, follow-up of his primary care physician -Due to drug interaction with new oral chemo he will need to monitor his BP more often.  -  Systolic BP had previously increased, so he was started on low dose Coreg. He has been dizzy lately. I will send note to Dr. Asher Muir about medication. -I previously advised him to continue to keep a BP log at home -better controlled lately, 125/95 today (10/22/17)   8. Depression  -She is on Zoloft 32m daily, management by his PCP  -improved overall   9. Goal of care  discussion  -We previously discussed the incurable nature of his metastatic melanoma, and the overall poor prognosis, especially if he does not have good response to chemotherapy or progress on chemo -The patient understands the goal of care is palliative. -He is full code, recent PET showed NED   10. CAD -history of 2 double vessel bypass -He has pPsychologist, forensic-per pt (his daughter) request, he has switched his cardiologist to Dr. BHaroldine Laws   Plan -Labs reviewed, will proceed Nivolumab today and continue every 2 weeks -Xgeva injection today and continue every 4 weeks  -F/u in 4 weeks  -I will update his daughter    All questions were answered. The patient knows to call the clinic with any problems, questions or concerns. No barriers to learning was detected.  I spent 20 minutes counseling the patient face to face. The total time spent in the appointment was 25 minutes and more than 50% was on counseling and review of test results.  IOneal Deputy am acting as scribe for YTruitt Merle MD.   I have reviewed the above documentation for accuracy and completeness, and I agree with the above.    YTruitt Merle MD 10/22/17

## 2017-10-22 ENCOUNTER — Telehealth: Payer: Self-pay

## 2017-10-22 ENCOUNTER — Inpatient Hospital Stay: Payer: Medicare Other

## 2017-10-22 ENCOUNTER — Encounter: Payer: Self-pay | Admitting: Hematology

## 2017-10-22 ENCOUNTER — Inpatient Hospital Stay (HOSPITAL_BASED_OUTPATIENT_CLINIC_OR_DEPARTMENT_OTHER): Payer: Medicare Other | Admitting: Hematology

## 2017-10-22 VITALS — BP 125/95 | HR 75 | Temp 97.8°F | Resp 17 | Ht 69.0 in | Wt 202.6 lb

## 2017-10-22 VITALS — BP 129/90 | HR 78 | Temp 98.2°F | Resp 17

## 2017-10-22 DIAGNOSIS — C7931 Secondary malignant neoplasm of brain: Secondary | ICD-10-CM | POA: Diagnosis not present

## 2017-10-22 DIAGNOSIS — I129 Hypertensive chronic kidney disease with stage 1 through stage 4 chronic kidney disease, or unspecified chronic kidney disease: Secondary | ICD-10-CM

## 2017-10-22 DIAGNOSIS — R4189 Other symptoms and signs involving cognitive functions and awareness: Secondary | ICD-10-CM

## 2017-10-22 DIAGNOSIS — G3184 Mild cognitive impairment, so stated: Secondary | ICD-10-CM

## 2017-10-22 DIAGNOSIS — F419 Anxiety disorder, unspecified: Secondary | ICD-10-CM

## 2017-10-22 DIAGNOSIS — C439 Malignant melanoma of skin, unspecified: Secondary | ICD-10-CM

## 2017-10-22 DIAGNOSIS — C770 Secondary and unspecified malignant neoplasm of lymph nodes of head, face and neck: Secondary | ICD-10-CM

## 2017-10-22 DIAGNOSIS — L308 Other specified dermatitis: Secondary | ICD-10-CM

## 2017-10-22 DIAGNOSIS — R5383 Other fatigue: Secondary | ICD-10-CM

## 2017-10-22 DIAGNOSIS — N183 Chronic kidney disease, stage 3 unspecified: Secondary | ICD-10-CM

## 2017-10-22 DIAGNOSIS — E291 Testicular hypofunction: Secondary | ICD-10-CM

## 2017-10-22 DIAGNOSIS — I10 Essential (primary) hypertension: Secondary | ICD-10-CM

## 2017-10-22 DIAGNOSIS — C7951 Secondary malignant neoplasm of bone: Secondary | ICD-10-CM | POA: Diagnosis not present

## 2017-10-22 DIAGNOSIS — I619 Nontraumatic intracerebral hemorrhage, unspecified: Secondary | ICD-10-CM

## 2017-10-22 DIAGNOSIS — C7800 Secondary malignant neoplasm of unspecified lung: Secondary | ICD-10-CM

## 2017-10-22 DIAGNOSIS — C799 Secondary malignant neoplasm of unspecified site: Secondary | ICD-10-CM

## 2017-10-22 DIAGNOSIS — Z8673 Personal history of transient ischemic attack (TIA), and cerebral infarction without residual deficits: Secondary | ICD-10-CM

## 2017-10-22 DIAGNOSIS — E039 Hypothyroidism, unspecified: Secondary | ICD-10-CM

## 2017-10-22 DIAGNOSIS — I25810 Atherosclerosis of coronary artery bypass graft(s) without angina pectoris: Secondary | ICD-10-CM

## 2017-10-22 DIAGNOSIS — Z5112 Encounter for antineoplastic immunotherapy: Secondary | ICD-10-CM | POA: Diagnosis not present

## 2017-10-22 LAB — CBC WITH DIFFERENTIAL/PLATELET
BASOS ABS: 0 10*3/uL (ref 0.0–0.1)
BASOS PCT: 1 %
EOS ABS: 0.3 10*3/uL (ref 0.0–0.5)
Eosinophils Relative: 7 %
HEMATOCRIT: 46 % (ref 38.4–49.9)
Hemoglobin: 15.5 g/dL (ref 13.0–17.1)
Lymphocytes Relative: 11 %
Lymphs Abs: 0.5 10*3/uL — ABNORMAL LOW (ref 0.9–3.3)
MCH: 31.1 pg (ref 27.2–33.4)
MCHC: 33.6 g/dL (ref 32.0–36.0)
MCV: 92.6 fL (ref 79.3–98.0)
MONO ABS: 0.5 10*3/uL (ref 0.1–0.9)
Monocytes Relative: 12 %
NEUTROS ABS: 3 10*3/uL (ref 1.5–6.5)
Neutrophils Relative %: 69 %
Platelets: 159 10*3/uL (ref 140–400)
RBC: 4.97 MIL/uL (ref 4.20–5.82)
RDW: 14.5 % (ref 11.0–14.6)
WBC: 4.3 10*3/uL (ref 4.0–10.3)

## 2017-10-22 LAB — COMPREHENSIVE METABOLIC PANEL
ALBUMIN: 3.8 g/dL (ref 3.5–5.0)
ALK PHOS: 54 U/L (ref 38–126)
ALT: 22 U/L (ref 0–44)
ANION GAP: 9 (ref 5–15)
AST: 20 U/L (ref 15–41)
BILIRUBIN TOTAL: 0.5 mg/dL (ref 0.3–1.2)
BUN: 27 mg/dL — ABNORMAL HIGH (ref 8–23)
CALCIUM: 9.3 mg/dL (ref 8.9–10.3)
CO2: 24 mmol/L (ref 22–32)
Chloride: 107 mmol/L (ref 98–111)
Creatinine, Ser: 1.27 mg/dL — ABNORMAL HIGH (ref 0.61–1.24)
GFR calc Af Amer: >60 mL/min (ref >60)
GFR calc non Af Amer: 52 mL/min — ABNORMAL LOW (ref >60)
GLUCOSE: 93 mg/dL (ref 70–99)
Potassium: 4.8 mmol/L (ref 3.5–5.1)
Sodium: 140 mmol/L (ref 135–145)
TOTAL PROTEIN: 6.8 g/dL (ref 6.5–8.1)

## 2017-10-22 LAB — TSH: TSH: 2.479 u[IU]/mL (ref 0.320–4.118)

## 2017-10-22 MED ORDER — DENOSUMAB 120 MG/1.7ML ~~LOC~~ SOLN
120.0000 mg | Freq: Once | SUBCUTANEOUS | Status: AC
  Administered 2017-10-22: 120 mg via SUBCUTANEOUS

## 2017-10-22 MED ORDER — SODIUM CHLORIDE 0.9 % IV SOLN
240.0000 mg | Freq: Once | INTRAVENOUS | Status: AC
  Administered 2017-10-22: 240 mg via INTRAVENOUS
  Filled 2017-10-22: qty 24

## 2017-10-22 MED ORDER — SODIUM CHLORIDE 0.9 % IV SOLN
Freq: Once | INTRAVENOUS | Status: AC
  Administered 2017-10-22: 10:00:00 via INTRAVENOUS

## 2017-10-22 MED ORDER — DENOSUMAB 120 MG/1.7ML ~~LOC~~ SOLN
SUBCUTANEOUS | Status: AC
  Filled 2017-10-22: qty 1.7

## 2017-10-22 NOTE — Patient Instructions (Signed)
[facility] Discharge Instructions for Patients Receiving Chemotherapy  Today you received the following chemotherapy agents: Opdivo.  To help prevent nausea and vomiting after your treatment, we encourage you to take your nausea medication as directed   If you develop nausea and vomiting that is not controlled by your nausea medication, call the clinic.   BELOW ARE SYMPTOMS THAT SHOULD BE REPORTED IMMEDIATELY:  *FEVER GREATER THAN 100.5 F  *CHILLS WITH OR WITHOUT FEVER  NAUSEA AND VOMITING THAT IS NOT CONTROLLED WITH YOUR NAUSEA MEDICATION  *UNUSUAL SHORTNESS OF BREATH  *UNUSUAL BRUISING OR BLEEDING  TENDERNESS IN MOUTH AND THROAT WITH OR WITHOUT PRESENCE OF ULCERS  *URINARY PROBLEMS  *BOWEL PROBLEMS  UNUSUAL RASH Items with * indicate a potential emergency and should be followed up as soon as possible.  Feel free to call the clinic should you have any questions or concerns. The clinic phone number is [phone].  Please show the CHEMO ALERT CARD at check-in to the Emergency Department and triage nurse.  Denosumab injection What is this medicine? DENOSUMAB (den oh sue mab) slows bone breakdown. Prolia is used to treat osteoporosis in women after menopause and in men. Xgeva is used to treat a high calcium level due to cancer and to prevent bone fractures and other bone problems caused by multiple myeloma or cancer bone metastases. Xgeva is also used to treat giant cell tumor of the bone. This medicine may be used for other purposes; ask your health care provider or pharmacist if you have questions. COMMON BRAND NAME(S): Prolia, XGEVA What should I tell my health care provider before I take this medicine? They need to know if you have any of these conditions: -dental disease -having surgery or tooth extraction -infection -kidney disease -low levels of calcium or Vitamin D in the blood -malnutrition -on hemodialysis -skin conditions or  sensitivity -thyroid or parathyroid disease -an unusual reaction to denosumab, other medicines, foods, dyes, or preservatives -pregnant or trying to get pregnant -breast-feeding How should I use this medicine? This medicine is for injection under the skin. It is given by a health care professional in a hospital or clinic setting. If you are getting Prolia, a special MedGuide will be given to you by the pharmacist with each prescription and refill. Be sure to read this information carefully each time. For Prolia, talk to your pediatrician regarding the use of this medicine in children. Special care may be needed. For Xgeva, talk to your pediatrician regarding the use of this medicine in children. While this drug may be prescribed for children as young as 13 years for selected conditions, precautions do apply. Overdosage: If you think you have taken too much of this medicine contact a poison control center or emergency room at once. NOTE: This medicine is only for you. Do not share this medicine with others. What if I miss a dose? It is important not to miss your dose. Call your doctor or health care professional if you are unable to keep an appointment. What may interact with this medicine? Do not take this medicine with any of the following medications: -other medicines containing denosumab This medicine may also interact with the following medications: -medicines that lower your chance of fighting infection -steroid medicines like prednisone or cortisone This list may not describe all possible interactions. Give your health care provider a list of all the medicines, herbs, non-prescription drugs, or dietary supplements you use. Also tell them if you smoke, drink alcohol, or use illegal   drugs. Some items may interact with your medicine. What should I watch for while using this medicine? Visit your doctor or health care professional for regular checks on your progress. Your doctor or health care  professional may order blood tests and other tests to see how you are doing. Call your doctor or health care professional for advice if you get a fever, chills or sore throat, or other symptoms of a cold or flu. Do not treat yourself. This drug may decrease your body's ability to fight infection. Try to avoid being around people who are sick. You should make sure you get enough calcium and vitamin D while you are taking this medicine, unless your doctor tells you not to. Discuss the foods you eat and the vitamins you take with your health care professional. See your dentist regularly. Brush and floss your teeth as directed. Before you have any dental work done, tell your dentist you are receiving this medicine. Do not become pregnant while taking this medicine or for 5 months after stopping it. Talk with your doctor or health care professional about your birth control options while taking this medicine. Women should inform their doctor if they wish to become pregnant or think they might be pregnant. There is a potential for serious side effects to an unborn child. Talk to your health care professional or pharmacist for more information. What side effects may I notice from receiving this medicine? Side effects that you should report to your doctor or health care professional as soon as possible: -allergic reactions like skin rash, itching or hives, swelling of the face, lips, or tongue -bone pain -breathing problems -dizziness -jaw pain, especially after dental work -redness, blistering, peeling of the skin -signs and symptoms of infection like fever or chills; cough; sore throat; pain or trouble passing urine -signs of low calcium like fast heartbeat, muscle cramps or muscle pain; pain, tingling, numbness in the hands or feet; seizures -unusual bleeding or bruising -unusually weak or tired Side effects that usually do not require medical attention (report to your doctor or health care professional  if they continue or are bothersome): -constipation -diarrhea -headache -joint pain -loss of appetite -muscle pain -runny nose -tiredness -upset stomach This list may not describe all possible side effects. Call your doctor for medical advice about side effects. You may report side effects to FDA at 1-800-FDA-1088. Where should I keep my medicine? This medicine is only given in a clinic, doctor's office, or other health care setting and will not be stored at home. NOTE: This sheet is a summary. It may not cover all possible information. If you have questions about this medicine, talk to your doctor, pharmacist, or health care provider.  2018 Elsevier/Gold Standard (2016-04-18 19:17:21)  

## 2017-10-22 NOTE — Telephone Encounter (Signed)
PRINTED AVS AND CALENDER OF UPCOMING APPOINTMENT. PER 7/15 LOS

## 2017-11-05 ENCOUNTER — Inpatient Hospital Stay: Payer: Medicare Other

## 2017-11-05 VITALS — BP 139/86 | HR 76 | Temp 97.8°F | Resp 18

## 2017-11-05 DIAGNOSIS — C7800 Secondary malignant neoplasm of unspecified lung: Secondary | ICD-10-CM | POA: Diagnosis not present

## 2017-11-05 DIAGNOSIS — Z5112 Encounter for antineoplastic immunotherapy: Secondary | ICD-10-CM | POA: Diagnosis not present

## 2017-11-05 DIAGNOSIS — C7951 Secondary malignant neoplasm of bone: Secondary | ICD-10-CM | POA: Diagnosis not present

## 2017-11-05 DIAGNOSIS — C799 Secondary malignant neoplasm of unspecified site: Secondary | ICD-10-CM

## 2017-11-05 DIAGNOSIS — C439 Malignant melanoma of skin, unspecified: Secondary | ICD-10-CM

## 2017-11-05 DIAGNOSIS — I619 Nontraumatic intracerebral hemorrhage, unspecified: Secondary | ICD-10-CM

## 2017-11-05 DIAGNOSIS — C7931 Secondary malignant neoplasm of brain: Secondary | ICD-10-CM | POA: Diagnosis not present

## 2017-11-05 DIAGNOSIS — G3184 Mild cognitive impairment, so stated: Secondary | ICD-10-CM | POA: Diagnosis not present

## 2017-11-05 DIAGNOSIS — I10 Essential (primary) hypertension: Secondary | ICD-10-CM

## 2017-11-05 LAB — CBC WITH DIFFERENTIAL/PLATELET
BASOS ABS: 0 10*3/uL (ref 0.0–0.1)
Basophils Relative: 0 %
EOS PCT: 7 %
Eosinophils Absolute: 0.4 10*3/uL (ref 0.0–0.5)
HEMATOCRIT: 47.7 % (ref 38.4–49.9)
HEMOGLOBIN: 16 g/dL (ref 13.0–17.1)
LYMPHS ABS: 0.7 10*3/uL — AB (ref 0.9–3.3)
LYMPHS PCT: 14 %
MCH: 31.3 pg (ref 27.2–33.4)
MCHC: 33.5 g/dL (ref 32.0–36.0)
MCV: 93.3 fL (ref 79.3–98.0)
Monocytes Absolute: 0.4 10*3/uL (ref 0.1–0.9)
Monocytes Relative: 8 %
NEUTROS ABS: 3.9 10*3/uL (ref 1.5–6.5)
Neutrophils Relative %: 71 %
Platelets: 135 10*3/uL — ABNORMAL LOW (ref 140–400)
RBC: 5.11 MIL/uL (ref 4.20–5.82)
RDW: 13.8 % (ref 11.0–14.6)
WBC: 5.5 10*3/uL (ref 4.0–10.3)

## 2017-11-05 LAB — TSH: TSH: 2.84 u[IU]/mL (ref 0.320–4.118)

## 2017-11-05 LAB — COMPREHENSIVE METABOLIC PANEL
ALBUMIN: 3.9 g/dL (ref 3.5–5.0)
ALT: 21 U/L (ref 0–44)
ANION GAP: 9 (ref 5–15)
AST: 22 U/L (ref 15–41)
Alkaline Phosphatase: 51 U/L (ref 38–126)
BUN: 35 mg/dL — AB (ref 8–23)
CHLORIDE: 109 mmol/L (ref 98–111)
CO2: 23 mmol/L (ref 22–32)
Calcium: 9.3 mg/dL (ref 8.9–10.3)
Creatinine, Ser: 1.44 mg/dL — ABNORMAL HIGH (ref 0.61–1.24)
GFR calc Af Amer: 52 mL/min — ABNORMAL LOW (ref >60)
GFR, EST NON AFRICAN AMERICAN: 45 mL/min — AB (ref >60)
Glucose, Bld: 141 mg/dL — ABNORMAL HIGH (ref 70–99)
POTASSIUM: 4.8 mmol/L (ref 3.5–5.1)
Sodium: 141 mmol/L (ref 135–145)
Total Bilirubin: 0.5 mg/dL (ref 0.3–1.2)
Total Protein: 7 g/dL (ref 6.5–8.1)

## 2017-11-05 MED ORDER — SODIUM CHLORIDE 0.9 % IV SOLN
Freq: Once | INTRAVENOUS | Status: AC
  Administered 2017-11-05: 10:00:00 via INTRAVENOUS
  Filled 2017-11-05: qty 250

## 2017-11-05 MED ORDER — SODIUM CHLORIDE 0.9 % IV SOLN
240.0000 mg | Freq: Once | INTRAVENOUS | Status: AC
  Administered 2017-11-05: 240 mg via INTRAVENOUS
  Filled 2017-11-05: qty 24

## 2017-11-05 NOTE — Patient Instructions (Signed)
[facility] Discharge Instructions for Patients Receiving Chemotherapy  Today you received the following chemotherapy agents Opdivo  To help prevent nausea and vomiting after your treatment, we encourage you to take your nausea medication as directed   If you develop nausea and vomiting that is not controlled by your nausea medication, call the clinic.   BELOW ARE SYMPTOMS THAT SHOULD BE REPORTED IMMEDIATELY:  *FEVER GREATER THAN 100.5 F  *CHILLS WITH OR WITHOUT FEVER  NAUSEA AND VOMITING THAT IS NOT CONTROLLED WITH YOUR NAUSEA MEDICATION  *UNUSUAL SHORTNESS OF BREATH  *UNUSUAL BRUISING OR BLEEDING  TENDERNESS IN MOUTH AND THROAT WITH OR WITHOUT PRESENCE OF ULCERS  *URINARY PROBLEMS  *BOWEL PROBLEMS  UNUSUAL RASH Items with * indicate a potential emergency and should be followed up as soon as possible.  Feel free to call the clinic should you have any questions or concerns. The clinic phone number is [phone].  Please show the CHEMO ALERT CARD at check-in to the Emergency Department and triage nurse.   

## 2017-11-06 ENCOUNTER — Telehealth: Payer: Self-pay

## 2017-11-06 NOTE — Telephone Encounter (Signed)
Happy to see him on the 12th.  [name]

## 2017-11-06 NOTE — Telephone Encounter (Signed)
David [name] calls regarding her father, when she brought him for treatment he had on shorts and she noticed his left thigh is noticeably smaller than his right thigh.  She wonders who she should make an appointment with regarding this?  And definitely she thinks he needs PT.  Her [phone]

## 2017-11-06 NOTE — Telephone Encounter (Signed)
I called back and left her a message. I suggest him to be evaluated by his neuro-oncologist [name]. My guess is probably related to the muscle atrophy in left thigh, he has not be very physically active lately. I left her my number to call back if needed.   Dr. Mickeal Skinner, are you able to see him when he comes in for [facility] treatment on 8/12? Thanks much.    David Merle MD

## 2017-11-06 NOTE — Telephone Encounter (Signed)
Happy to see him on the 12th.  David [name]

## 2017-11-08 ENCOUNTER — Ambulatory Visit (INDEPENDENT_AMBULATORY_CARE_PROVIDER_SITE_OTHER): Payer: Medicare Other | Admitting: Podiatry

## 2017-11-08 ENCOUNTER — Encounter: Payer: Self-pay | Admitting: Podiatry

## 2017-11-08 ENCOUNTER — Other Ambulatory Visit: Payer: Self-pay | Admitting: Cardiovascular Disease

## 2017-11-08 ENCOUNTER — Other Ambulatory Visit: Payer: Self-pay | Admitting: Family Medicine

## 2017-11-08 DIAGNOSIS — I255 Ischemic cardiomyopathy: Secondary | ICD-10-CM | POA: Diagnosis not present

## 2017-11-08 DIAGNOSIS — L84 Corns and callosities: Secondary | ICD-10-CM | POA: Diagnosis not present

## 2017-11-08 DIAGNOSIS — Q828 Other specified congenital malformations of skin: Secondary | ICD-10-CM

## 2017-11-08 NOTE — Progress Notes (Signed)
This patient presents the office follow-up for a previously  treated under his big toe joint right foot.  He says that the callus that has developed is painful walking.  He has been wearing his insoles in his shoes.  He denies any drainage from this painful skin lesion.Marland Kitchen  He presents the office today for continued evaluation and treatment of this skin lesion   General Appearance  Alert, conversant and in no acute stress.  Vascular  Dorsalis pedis and posterior pulses are palpable  bilaterally.  Capillary return is within normal limits  bilaterally. Temperature is within normal limits  Bilaterally.  Neurologic  [name]-Weinstein monofilament wire test within normal limits  bilaterally. Muscle power within normal limits bilaterally.  Nails Normotropic nails both feet.  Orthopedic  No limitations of motion of motion feet bilaterally.  No crepitus or effusions noted.  DJD 1st MPJ  B/L. Plantarflexed first metatarsal right foot.  Skin  Porokeratosis sub 1 right foot.   No evidence of any fluctuance or drainage noted    No signs of infections or ulcers noted.   Porokeratosis/Skin ulcer 1st MPJ left foot.    ROV .   He is to return to the office in 12 weeks for further evaluation and treatment.  Continue using orthoses.     Gardiner Barefoot DPM

## 2017-11-08 NOTE — Telephone Encounter (Signed)
Sent refill

## 2017-11-16 NOTE — Progress Notes (Signed)
[place]  [phone]) [phone] [phone]  Clinic Follow up Note   Patient Care Team: [name], MD as PCP - General (Family Medicine)   Date of Service:  11/19/2017  Chief complaint: Follow-up metastatic melanoma   Oncology History   Metastatic melanoma [facility])   Staging form: Melanoma of the Skin, AJCC 7th Edition     Clinical stage from 09/21/2015: Stage IV [place], [employer], [place]) - Signed by [name], MD on 10/09/2015       Malignant melanoma ([place])   09/16/2015 Imaging    PET scan showed a hypermetabolic 2.2QJ lingular nodule, and a 37m right upper lobe lung nodule, left subclavicular nodes, and bone metastasis in the proximal right humerus and right femur.     09/21/2015 Initial Diagnosis    [place]astatic [place]    09/21/2015 Initial Biopsy    Left subclavicular lymph node biopsy showed prostatic of melanoma    09/21/2015 Miscellaneous    BRAF V600K mutation (+)     10/04/2015 Imaging    CT head with without contrast showed a 2.0 x 1.6 x 1.7 cm superficial left frontal hyperdense lesion, with postcontrast enhancement, lying within the previous hemorrhagic area, concerning for solitary melanoma metastasis    10/06/2015 - 09/06/2016 Chemotherapy    Nivolumab 2470mevery 2 weeks, held due to his dexamethasone for radionecrosis     10/13/2015 - 10/13/2015 Radiation Therapy    10/13/2015 Preop SRS treatment: 14 Gy  to the left frontal lesion in 1 fraction    10/15/2015 Surgery    left front craniotomy and tumor excision     10/15/2015 Pathology Results    left frontal brain mass excision showed metastatic melanoma, 1.5X1.5X0.6cm    01/13/2016 Imaging    CT head without and with contrast showed ill-defined enhancement and dural thickening of the left frontal resection cavity may represent a post treatment changes or residual neoplasm. A new 6 mm hypodensity in the right cerebellar hemisphere with uncertainty enhancement may represent a metastasis or small  brain parenchymal hemorrhage.     01/27/2016 - 01/27/2016 Radiation Therapy    01/27/16 SRS Treatment:  20 Gy in 1 fraction to a 6 mm right cerebellar brain met     03/29/2016 PET scan    PET 03/29/2016 IMPRESSION: 1. Overall there has been an interval response to therapy. The index lesions sided on previous exam it are less hypermetabolic when compared with the previous exam. 2. There is an intramuscular lesion within the right masseter which has increased FDG uptake compared with previous exam. 3. No new sites of disease identified. 4. Aortic atherosclerosis and infrarenal abdominal aortic aneurysm. Recommend followup by ultrasound in 3 years. This recommendation follows ACR consensus guidelines: White Paper of the ACR Incidental Findings Committee II on Vascular Findings. J Joellyn Ruedadiol 2013; 10:789-794    04/20/2016 Imaging    CT Head w wo contrast 1. Mild patchy enhancement at the left anterior frontal lobe treatment site (series 11, image 39) without mass effect and stable surrounding white matter hypodensity without strong evidence of acute vasogenic edema. Recommend continued CT surveillance. 2. No new metastatic disease or new intracranial abnormality identified. 3. Probable benign 14 mm left parietal bone lucent lesion, unchanged since May 2017.    05/06/2016 - 05/08/2016 Hospital Admission    Pt was admitted for rectal bleeding for one day. Breathing spontaneously resolved, hemoglobin dropped from 15 to 10.5, did not require blood transfusion. Colonoscopy showed no active bleeding, except diverticulosis  and hemorrhoid. He was discharged to home.    05/08/2016 Procedure    Colonoscopy  - Stool in the ascending colon. Fluid aspiration performed. - Diverticulosis in the entire examined colon. - Non-bleeding internal hemorrhoids. - The examination was otherwise normal on direct and retroflexion views.    05/29/2016 Imaging    PET Image Restaging IMPRESSION: 1. Stable  exam with no evidence progression. 2. Photopenia in the LEFT frontal lobe at prior surgical site. 3. Stable mildly hypermetabolic RIGHT paratracheal node. This may represent a thyroid nodule. 4. Stable size of minimally metabolic lingular pulmonary nodule. 5. Stable skeletal lesion of the RIGHT iliac crest 6. No cutaneous metabolic lesion.    07/20/2016 Imaging    CT Head W WO Contrast IMPRESSION: Pronounced change in the left frontal region. Much more regional vasogenic edema. Relatively stable appearance of the abnormal enhancement extending from the lateral margin of the frontal horn of left lateral ventricle to the frontal brain surface. New region of abnormal enhancement surrounding the frontal horn of the left lateral ventricle measuring approximately 3.2 cm. The differential diagnosis is recurrent tumor versus radiation necrosis. The patient is reportedly not an MRI candidate. This area is large enough that CT perfusion could possibly make a reasonable differentiation.    09/02/2016 Imaging    CT Head 09/02/16 IMPRESSION: Somewhat mixed pattern of slight posterior progression of pleomorphic enhancement surrounding the LEFT lateral ventricle although no significant mass effect, and reduced vasogenic edema compared with the scan in April.    09/27/2016 - 11/02/2016 Chemotherapy    Start dabrafenib and trametinib 09/27/16, stopped due to severe fatigue      10/05/2016 Imaging    CT Head w & w/o contrast  IMPRESSION: 1. Decreased size of the enhancing area within the left frontal lobe treatment site. Surrounding edema has also decreased. The findings support continued evolution of radiation necrosis. 2. No new enhancing lesions.    10/18/2016 PET scan    IMPRESSION: 1. New focal hypermetabolism in the proximal sigmoid colon with associated focal colonic wall thickening and mild pericolonic fat stranding at the site of a proximal sigmoid diverticulum, favored to represent mild  acute sigmoid diverticulitis. Metastatic disease is considered less likely. Clinical correlation is necessary. Consider appropriate antibiotic therapy, as clinically warranted. Attention to this region recommended on future PET-CT follow-up. 2. Otherwise complete metabolic response with no evidence of hypermetabolic metastatic disease. Lingular pulmonary nodule is slightly decreased in size and demonstrates no significant metabolism. 3. No residual hypermetabolism in the right thyroid lobe nodule, which is stable in size. 4. Aortic Atherosclerosis (ICD10-I70.0). Stable 3.1 cm infrarenal abdominal aortic aneurysm. Aortic aneurysm NOS (ICD10-I71.9). Recommend followup by ultrasound in 3 years. This recommendation follows ACR consensus guidelines: White Paper of the ACR Incidental Findings Committee II on Vascular Findings. Joellyn Rued Radiol 2013; 32:951-884.    11/01/2016 - 11/04/2016 Hospital Admission    Patient admitted to the hospital due to hypercalcemia where he received IV fluids and pamidronate    12/13/2016 PET scan    PET 12/13/16 IMPRESSION: 1. No findings to suggest metastatic disease on today's examination. 2. Previously noted hypermetabolism in the sigmoid colon has resolved, presumably indicative of a focus of diverticulitis on the prior study. Today's study does demonstrate relatively diffuse hypermetabolism throughout the cecum, ascending colon and proximal transverse colon where there is some mild mural thickening, favored to reflect mild chronic inflammation related to underlying diverticular disease. No definite inflammatory changes on the CT portion of the examination to  strongly suggest an active acute diverticulitis at this time. No discrete lesion to suggest neoplasm. 3. Previously noted 8 mm lingular nodule is stable in size and known remains non hypermetabolic. This is nonspecific but reassuring. Continued attention on future followup studies is recommended. 4.  Aortic atherosclerosis, in addition to left main and 3 vessel coronary artery disease. Status post median sternotomy for CABG including LIMA to the LAD. In addition, there is mild fusiform aneurysmal dilatation of the infrarenal abdominal aorta (3.1 cm in diameter) as well as aneurysmal dilatation of the left common iliac    12/18/2016 -  Chemotherapy    Restarted nivolamab 22m every 2 weeks, with tapering dose dexamethasone      04/18/2017 Imaging    PET Scan IMPRESSION: 1. No hypermetabolic lesion to suggest active malignancy. 2. Stable 8 mm lingular nodule is not currently hypermetabolic but merit surveillance. Infrarenal abdominal aortic aneurysm 3.1 cm in diameter. Recommend followup by [facility] 3 years. 3. Other imaging findings of potential clinical significance: Postoperative findings in the left frontal lobe. Right renal cyst. Aortic Atherosclerosis (ICD10-I70.0). Osteoarthritis. Lumbar scoliosis. Left knee effusion. Colonic diverticulosis.     10/10/2017 PET scan    IMPRESSION: 1. Small focus of FDG accumulation in the subcutaneous fat of the right gluteal region, nonspecific. Attention on follow-up suggested. 2. Otherwise stable exam without new or progressive hypermetabolic disease in the interval since prior PET-CT. 3. Stable 8 mm nodule in the lingula. No evidence for hypermetabolism on PET imaging. 4.  Aortic Atherosclerois (ICD10-170.0)      History of present illness (09/02/2015): Pt is a 77y.o. Retired dPharmacist, communitywith history of HTN, OSA, CKD, PPM who was admitted on 08/22/15 with expressive difficulty. He was found to have left frontal hemorrhage 5.7 cm x 4.5 cm with 6 mm left to right shift.During the workup for his stroke, a left upper lung mass was noticed on the CT scan. I was called to evaluate his lung mass.  Patient has aphasia, not able to communicate much. According to his daughter, Dr. LGala Romney who is a OB GYN at [employer] he probably only smoked for very  short period time during the college. He did have 3 skin melanoma, which was removed, last one was 2-3 years ago. No other cancer history.  CURRENT THERAPY:   -Monthly Xgeva starting 02/2016 -Restarted nivolamab on 12/18/2016 and changed to every 4 weeks on 01/30/2017, changed back to every 2 weeks due to fatigue on 04/23/17  INTERVAL HISTORY:   CLeman [name] returns for follow-up and ongoing treatment. He presents to the clinic today by himself. He notes he is moderately well, stable. He saw Dr. VMickeal Skinnera few weeks ago and things are going well for him. He is doing pushup and pullups and elyptical. He still sees PT who says L leg is slightly smaller than the right. He denies any weakness differences in his legs.  He notes he is eating well and was able to gain some weight. He feels he is not drinking enough water.      REVIEW OF SYSTEMS:   Constitutional: Denies fevers, chills or abnormal weight loss (+) weight gain Eyes: Denies blurriness of vision Ears, nose, mouth, throat, and face: Denies mucositis or sore throat Respiratory: Denies cough, dyspnea or wheezes Cardiovascular: Denies palpitation, chest discomfort or lower extremity swelling Gastrointestinal:  Denies nausea, heartburn, hematochezia  Skin: Negative. Lymphatics: Denies new lymphadenopathy or easy bruising Neurological: (+) trouble processing short and long term memory (+)  balance issues from leg weakness, no falls  MSK: (+) Arthritis pain Behavioral/Psych: Mood is stable, no new changes  All other systems were reviewed with the patient and are negative.  MEDICAL HISTORY:  Past Medical History:  Diagnosis Date  . Anginal pain ([place])   . Arthritis    mostly hands  . Benign familial hematuria   . BPH (benign prostatic hypertrophy)   . Brain cancer ([place])    melanoma with brain met  . Coronary artery disease   . GERD (gastroesophageal reflux disease)   . High cholesterol   . Hypertension   . Hypothyroidism   .  Intracerebral hemorrhage ([place])   . Melanoma ([place])   . Metastatic melanoma to lung ([place])   . Mood disorder ([place])   . Myocardial infarction ([place])   . Pacemaker    due to syncope, 3rd degree HB (upgrade to [facility]. Jude CRT-P 02/25/13 (Dr. Uvaldo Rising)  . Rectal bleed    due to NSAIDS  . Renal disorder   . Skin cancer    melanoma  . Sleep apnea    uses CPAP  . Stroke [facility])     SURGICAL HISTORY: Past Surgical History:  Procedure Laterality Date  . APPLICATION OF CRANIAL NAVIGATION N/A 10/15/2015   Procedure: APPLICATION OF CRANIAL NAVIGATION;  Surgeon: Erline Levine, MD;  Location: [place] NEURO ORS;  Service: Neurosurgery;  Laterality: N/A;  . CARDIAC CATHETERIZATION  2013  . CARDIAC SURGERY     bypass X 2  . COLONOSCOPY WITH PROPOFOL N/A 05/08/2016   Procedure: COLONOSCOPY WITH PROPOFOL;  Surgeon: Jonathon Bellows, MD;  [facility];  Service: Gastroenterology;  Laterality: N/A;  . CORONARY ARTERY BYPASS GRAFT  2013   LIMA-LAD, SVG-PDA 03/27/11 (Dr. Francee Gentile)  . CRANIOTOMY N/A 10/15/2015   Procedure: LEFT FRONTAL CRANIOTOMY TUMOR EXCISION with Curve;  Surgeon: Erline Levine, MD;  Location: [place] NEURO ORS;  Service: Neurosurgery;  Laterality: N/A;  CRANIOTOMY TUMOR EXCISION  . EYE SURGERY    . FOOT SURGERY  08/2010  . JOINT REPLACEMENT Left    partial knee  . KNEE ARTHROSCOPY  12/2008  . LYMPH NODE BIOPSY    . PACEMAKER INSERTION    . TONSILLECTOMY      I have reviewed the social history and family history with the patient and they are unchanged from previous note.  ALLERGIES:  is allergic to ezetimibe and simvastatin.  MEDICATIONS:  Current Outpatient Medications  Medication Sig Dispense Refill  . acetaminophen (TYLENOL) 500 MG tablet Take 500 mg by mouth at bedtime.    Marland Kitchen amLODipine (NORVASC) 5 MG tablet Take 1 tablet (5 mg total) by mouth daily. 90 tablet 3  . aspirin EC 81 MG tablet Take 81 mg by mouth daily.    . fluocinonide cream (LIDEX) 0.05 % Apply topically 2 (two) times daily as  needed. 30 g 1  . levothyroxine (SYNTHROID, LEVOTHROID) 137 MCG tablet TAKE 1 TABLET BY MOUTH ONCE DAILY BEFORE BREAKFAST 30 tablet 1  . metoprolol succinate (TOPROL-XL) 50 MG 24 hr tablet Take 1.5 tablets daily (27m). Take with or immediately following a meal. 135 tablet 3  . pentoxifylline (TRENTAL) 400 MG CR tablet Take 1 tablet (400 mg total) by mouth 2 (two) times daily. 60 tablet 6  . rosuvastatin (CRESTOR) 20 MG tablet TAKE ONE TABLET BY MOUTH AT BEDTIME 30 tablet 8  . sertraline (ZOLOFT) 100 MG tablet     . testosterone cypionate (DEPOTESTOTERONE CYPIONATE) 100 MG/ML injection Inject 1 mL (100 mg total)  into the muscle every 14 (fourteen) days. 10 mL 0  . Turmeric 500 MG CAPS Take 2 capsules by mouth daily.    . vitamin E (VITAMIN E) 400 UNIT capsule Take 1 capsule (400 Units total) by mouth 2 (two) times daily. 60 capsule 6   No current facility-administered medications for this visit.    Facility-Administered Medications Ordered in Other Visits  Medication Dose Route Frequency Provider Last Rate Last Dose  . 0.9 %  sodium chloride infusion  1,000 mL Intravenous Once [name], MD        PHYSICAL EXAMINATION:  ECOG PERFORMANCE STATUS: 2 BP (!) 128/94 (BP Location: Left Arm, Patient Position: Sitting) Comment: RN Cherly aware of BP.  Pulse 77   Temp (!) 97.5 F (36.4 David) (Oral)   Resp 18   Ht _0  (1.753 m)   Wt 209 lb 8 oz (95 kg)   SpO2 98%   BMI 30.94 kg/m    GENERAL:alert, no distress and comfortable SKIN: Significant skin thickening below his right big toe with mild ecchymosis. Other skin color, texture, turgor are normal.  EYES: normal, Conjunctiva are pink and non-injected, sclera clear OROPHARYNX:no exudate, no erythema and lips, buccal mucosa, and tongue normal  NECK: supple, thyroid normal size, non-tender, without nodularity LYMPH:  no palpable lymphadenopathy in the cervical, [place], axillary or inguinal LUNGS: clear to auscultation and percussion with normal  breathing effort HEART: regular rate & rhythm and no murmurs and no lower extremity edema ABDOMEN:abdomen soft, non-tender and normal bowel sounds Musculoskeletal:no cyanosis of digits and no clubbing    LABORATORY DATA:  I have reviewed the data as listed CBC Latest Ref Rng & Units 11/19/2017 11/05/2017 10/22/2017  WBC 4.0 - 10.3 K/uL 4.2 5.5 4.3  Hemoglobin 13.0 - 17.1 g/dL 16.1 16.0 15.5  Hematocrit 38.4 - 49.9 % 48.3 47.7 46.0  Platelets 140 - 400 K/uL 150 135(L) 159     CMP Latest Ref Rng & Units 11/19/2017 11/05/2017 10/22/2017  Glucose 70 - 99 mg/dL 105(H) 141(H) 93  BUN 8 - 23 mg/dL 27(H) 35(H) 27(H)  Creatinine 0.61 - 1.24 mg/dL 1.33(H) 1.44(H) 1.27(H)  Sodium 135 - 145 mmol/L 138 141 140  Potassium 3.5 - 5.1 mmol/L 4.8 4.8 4.8  Chloride 98 - 111 mmol/L 106 109 107  CO2 22 - 32 mmol/L 20(L) 23 24  Calcium 8.9 - 10.3 mg/dL 9.0 9.3 9.3  Total Protein 6.5 - 8.1 g/dL 7.1 7.0 6.8  Total Bilirubin 0.3 - 1.2 mg/dL 0.5 0.5 0.5  Alkaline Phos 38 - 126 U/L 53 51 54  AST 15 - 41 U/L _1 ALT 0 - 44 U/L _2 PATHOLOGY REPORT:   Diagnosis 07/16/17  Skin , left medial knee SPONGIOTIC PSORIASIFORM DERMATITIS, SEE DESCRIPTION Microscopic Comment There is a spongiotic and psoriasiform tissue reaction involving the epidermis with an underlying mononuclear cell infiltrate in which eosinophils are not conspicuous. This pattern can be seen in a chronic eczematous reaction such as a chronic contact dermatitis, id reaction, or nummular eczema. A special stain (PAS-F) is performed to determine the presence or absence of fungal hyphae in this biopsy. The PAS stain is negative for fungal hyphae, and the controls stained appropriately. The slides were also reviewed by Dr. Andria Frames, who is in general agreement with the diagnosis.   Diagnosis 09/21/2015 Lymph node, needle/core biopsy, hypermetabolic left sub clavicular LN - METASTATIC MALIGNANT MELANOMA. - SEE COMMENT.  Diagnosis  10/15/2015 Brain,  for tumor resection, Left frontal - METASTATIC MELANOMA.     PROCEDURES: Colonoscopy 05/08/16 - Stool in the ascending colon. Fluid aspiration performed. - Diverticulosis in the entire examined colon. - Non-bleeding internal hemorrhoids. - The examination was otherwise normal on direct and retroflexion views.  RADIOGRAPHIC STUDIES: I have personally reviewed the radiological images as listed and agreed with the findings in the report. No results found.    PET 10/10/17 IMPRESSION: 1. Small focus of FDG accumulation in the subcutaneous fat of the right gluteal region, nonspecific. Attention on follow-up suggested. 2. Otherwise stable exam without new or progressive hypermetabolic disease in the interval since prior PET-CT. 3. Stable 8 mm nodule in the lingula. No evidence for hypermetabolism on PET imaging. 4.  Aortic Atherosclerois (ICD10-170.0)    CT Head with and without contrast 11/27/2016 IMPRESSION: 1. Continued evolution of post radiation changes in the left frontal lobe with further decrease in the area of contrast enhancement and adjacent edema. 2. No new contrast-enhancing lesions.  PET 10/18/16 IMPRESSION: 1. New focal hypermetabolism in the proximal sigmoid colon with associated focal colonic wall thickening and mild pericolonic fat stranding at the site of a proximal sigmoid diverticulum, favored to represent mild acute sigmoid diverticulitis. Metastatic disease is considered less likely. Clinical correlation is necessary. Consider appropriate antibiotic therapy, as clinically warranted. Attention to this region recommended on future PET-CT follow-up. 2. Otherwise complete metabolic response with no evidence of hypermetabolic metastatic disease. Lingular pulmonary nodule is slightly decreased in size and demonstrates no significant metabolism. 3. No residual hypermetabolism in the right thyroid lobe nodule, which is stable in size. 4. Aortic  Atherosclerosis (ICD10-I70.0). Stable 3.1 cm infrarenal abdominal aortic aneurysm. Aortic aneurysm NOS (ICD10-I71.9). Recommend followup by ultrasound in 3 years. This recommendation follows ACR consensus guidelines: White Paper of the ACR Incidental Findings Committee II on Vascular Findings. J Am Coll Radiol 2013;   CT Head W and WO Contrast 10/05/16 IMPRESSION: 1. Decreased size of the enhancing area within the left frontal lobe treatment site. Surrounding edema has also decreased. The findings support continued evolution of radiation necrosis. 2. No new enhancing lesions.  CT Head 09/02/16 IMPRESSION: Somewhat mixed pattern of slight posterior progression of pleomorphic enhancement surrounding the LEFT lateral ventricle although no significant mass effect, and reduced vasogenic edema compared with the scan in April.  CT Head W WO Contrast 07-27-16 IMPRESSION: Pronounced change in the left frontal region. Much more regional vasogenic edema. Relatively stable appearance of the abnormal enhancement extending from the lateral margin of the frontal horn of left lateral ventricle to the frontal brain surface. New region of abnormal enhancement surrounding the frontal horn of the left lateral ventricle measuring approximately 3.2 cm. The differential diagnosis is recurrent tumor versus radiation necrosis. The patient is reportedly not an MRI candidate. This area is large enough that CT perfusion could possibly make a reasonable differentiation.  PET Image Restage 05/29/16 IMPRESSION: 1. Stable exam with no evidence progression. 2. Photopenia in the LEFT frontal lobe at prior surgical site. 3. Stable mildly hypermetabolic RIGHT paratracheal node. This may represent a thyroid nodule. 4. Stable size of minimally metabolic lingular pulmonary nodule. 5. Stable skeletal lesion of the RIGHT iliac crest 6. No cutaneous metabolic lesion.   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   1. Metastatic melanoma to lung, node, bone and brain, stage IV, BRAF V600K mutation (+) -I  previously reviewed his PET/CT scan images with pt and his family members. The scan revealed hypermetabolic left upper lobe lung mass, 2 small right lung nodules, probable bone metastasis, and a subcutaneous left supraclavicular lesion, which is not palpable  -His repeat CT had showed a 2.0 cm left frontal hyperdense lesion with enhancement on contrast, in the area of his recent hemorrhagic stroke, highly suspicious for brain metastasis.  -We previously discussed his left supraclavicular node biopsy results, which showed metastatic melanoma.. -His case was discussed in our sarcoma tumor board. -I previously recommended a first-line treatment with Nivolamab alone, given his recent stroke, low tumor burden. I'll reserve BRAF inhibitor with or without MEK inhibitor as a second line therapy if he progress on immunotherapy. Other options of immunotherapy, especially checkpoint inhibitor with ipilumimab, were also discussed with pt and his daughter -He is on first line therapy with Nivolumab, has been tolerating Nivolumab well, no significant side effects except fatigue. It was held from 09/06/16 to 12/18/2016 due to his chronic steroids use. -He has had near complete metabolic response on the recent restaging PET scan in July 2018. 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 -PET can 12/13/16 shows no evidence of disease. He has achieved a radiographic complete response.  -He has restarted Nivolamab on 12/18/16 and changed to every 4 weeks on  01/30/17. I plan to continue Nivolumab for total of 2 years if he remains to be NED on scan -He finish tapering steroids on 10/32/18, he has tolerated this well.   -PET scan on 04/18/2017 again showed complete radiographic metabolic response with only a residual 23m left lung nodule without any FDG uptake. Discussed results with pt and he is pleased  -Due to his fatigue after treatment, will continue his Nivolumab every 2 weeks in 04/2017 -We previously discussed his 10/10/17 PET which shows stable with no disease progression. There is incidental finding of soft tissue uptake in the right gluteal region. Pt notes recent body ache in that location, but exam that area showed no skin change, palpable mass, tenderness or other significant findings. Will monitor. -I previously discussed he continues to have good response to treatment. Will continue Nivolumab and plan to stop in 12/2017 to complete 2 years of treatment.  -I again encouraged him to continue exercising regularly.  -He is clinically doing well, no symptoms, except memory loss and cognitive dysfunction, able to do self-care at home. Physical exam unremarkable.  -Labs reviewed, CBC overall WNL and CKD is stable. Overall adequate to proceed with Nivolumab and Xgeva today. I discussed he has 3 cycles left and I plan to scan him afterward.  -F/u in 4 weeks   2. Skin rash- Pruritic maculopapular rash to back, arms, hands; scaly plaque rash to legs -Skin rash on left medial leg was biopsied, path revealed spongiotic psoriasiform dermatitis, no evidence of fungal infection. Stains suggest chronic eczematous reaction such as a chronic contact dermatitis, ID reaction, or nummular eczema.  -I believe this is related to Nivolumab. The rash improved dramatically with lidex cream. -I previously recommended he continue lidex; I reviewed risk of possible flare in the future -Rash overall resolved, he no longer uses cream. If his rash progresses or returns I  encouraged him to restart Lidex again.    3.  History of hemorrhagic stroke and brain metastasis  -I previously encouraged him to continue physical therapy and occupational therapy. 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 However his clinical neurological function has deteriorated -I have previously discussed with our neuro-oncologist Dr. 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 Mickeal Skinner, his radionecrosis has improved, no other new lesions. He was also seen by Dr. Mickeal Skinner who feels he is recovering well from neurological standpoint -I tapered his steroids until he stopped altogether by 01/29/17 -He will continue to f/u with Dr. Mickeal Skinner.  4. 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 Mickeal Skinner to discuss treatment options to improve his neurologic function, patient is very frustrated about this, which significantly impacting his quality of life. -continue PT/OT  -CT Head 11/27/2016 showed continued evolution of post radiation changes in the left frontal lobe with further decrease in the area of contrast enhancement and adjacent edema. No new contrast-enhancing lesions.  -He will continue to follow up with Dr. Mickeal Skinner.   5.  Hypercalcemia and CKD stage III  -Possibly related to his  underlying malignancy (bone mets) and missing one dose of Xgeva. He has restarted Xgeva monthly on 11/10/16.   -I previously advised him to eat less cheese and cut out milk and dairy products as possible -I previously encouraged him to drink water adequately and avoid NSAIDs -Ca normal and Cr at 1.33, BUN at 27 today (11/19/17)  6. Fatigue, hypothyroidism and hypotestosteronemia -He has baseline hypothyroidism and hypotestosteronemia -His fatigue has much improved since he restarted testosterone injection by his primary care physician -His TSH level have been slightly fluctuating, possibly partially related to Nivolumab -He will follow-up with his primary care physician for his Synthroid dose adjustment    7. HTN   -he is on amlodipine and carvedilol, follow-up of his primary care physician -Due to drug interaction with new oral chemo he will need to monitor his BP more often.  -Systolic BP had previously increased, so he was started on low dose Coreg. He has been dizzy lately.  -I previously advised him to continue to keep a BP log at home -better controlled lately and stable at 128/94 today (11/19/17)    8. Depression  -She is on Zoloft 68m daily, management by his PCP  -improved overall   9. Goal of care discussion  -We previously discussed the incurable nature of his metastatic melanoma, and the overall poor prognosis, especially if he does not have good response to chemotherapy or progress on chemo -The patient understands the goal of care is palliative. -He is full code, recent PET showed NED   10. CAD -history of 2 double vessel bypass -He has pPsychologist, forensic-per pt (his daughter) request, he has switched his cardiologist to Dr. BHaroldine Laws   Plan -Labs reviewed, will proceed Nivolumab today and continue  every 2 weeks for 3 more cycles  -Xgeva injection today and continue every 4 weeks  -F/u in 4 weeks, will order restaging PET on next visit    All questions were answered. The  patient knows to call the clinic with any problems, questions or concerns. No barriers to learning was detected.  I spent 20 minutes counseling the patient face to face. The total time spent in the appointment was 25 minutes and more than 50% was on counseling and review of test results.  Oneal Deputy, am acting as scribe for [name], MD.   I have reviewed the above documentation for accuracy and completeness, and I agree with the above.     [name], MD 11/19/17

## 2017-11-19 ENCOUNTER — Inpatient Hospital Stay (HOSPITAL_BASED_OUTPATIENT_CLINIC_OR_DEPARTMENT_OTHER): Payer: Medicare Other | Admitting: Hematology

## 2017-11-19 ENCOUNTER — Inpatient Hospital Stay: Payer: Medicare Other | Attending: Hematology

## 2017-11-19 ENCOUNTER — Inpatient Hospital Stay (HOSPITAL_BASED_OUTPATIENT_CLINIC_OR_DEPARTMENT_OTHER): Payer: Medicare Other | Admitting: Internal Medicine

## 2017-11-19 ENCOUNTER — Encounter: Payer: Self-pay | Admitting: Hematology

## 2017-11-19 ENCOUNTER — Inpatient Hospital Stay: Payer: Medicare Other

## 2017-11-19 ENCOUNTER — Encounter: Payer: Self-pay | Admitting: Internal Medicine

## 2017-11-19 ENCOUNTER — Telehealth: Payer: Self-pay

## 2017-11-19 VITALS — BP 146/98 | HR 74 | Temp 97.6°F | Resp 20 | Ht 69.0 in | Wt 208.7 lb

## 2017-11-19 VITALS — BP 128/94 | HR 77 | Temp 97.5°F | Resp 18 | Ht 69.0 in | Wt 209.5 lb

## 2017-11-19 DIAGNOSIS — N183 Chronic kidney disease, stage 3 unspecified: Secondary | ICD-10-CM

## 2017-11-19 DIAGNOSIS — C439 Malignant melanoma of skin, unspecified: Secondary | ICD-10-CM | POA: Diagnosis not present

## 2017-11-19 DIAGNOSIS — Z79899 Other long term (current) drug therapy: Secondary | ICD-10-CM | POA: Insufficient documentation

## 2017-11-19 DIAGNOSIS — C799 Secondary malignant neoplasm of unspecified site: Secondary | ICD-10-CM

## 2017-11-19 DIAGNOSIS — C7800 Secondary malignant neoplasm of unspecified lung: Secondary | ICD-10-CM

## 2017-11-19 DIAGNOSIS — C770 Secondary and unspecified malignant neoplasm of lymph nodes of head, face and neck: Secondary | ICD-10-CM | POA: Diagnosis not present

## 2017-11-19 DIAGNOSIS — R413 Other amnesia: Secondary | ICD-10-CM | POA: Diagnosis not present

## 2017-11-19 DIAGNOSIS — Z5112 Encounter for antineoplastic immunotherapy: Secondary | ICD-10-CM | POA: Insufficient documentation

## 2017-11-19 DIAGNOSIS — R4189 Other symptoms and signs involving cognitive functions and awareness: Secondary | ICD-10-CM | POA: Diagnosis not present

## 2017-11-19 DIAGNOSIS — I10 Essential (primary) hypertension: Secondary | ICD-10-CM

## 2017-11-19 DIAGNOSIS — I129 Hypertensive chronic kidney disease with stage 1 through stage 4 chronic kidney disease, or unspecified chronic kidney disease: Secondary | ICD-10-CM

## 2017-11-19 DIAGNOSIS — I619 Nontraumatic intracerebral hemorrhage, unspecified: Secondary | ICD-10-CM

## 2017-11-19 DIAGNOSIS — C7931 Secondary malignant neoplasm of brain: Secondary | ICD-10-CM | POA: Diagnosis not present

## 2017-11-19 DIAGNOSIS — C7951 Secondary malignant neoplasm of bone: Secondary | ICD-10-CM | POA: Insufficient documentation

## 2017-11-19 DIAGNOSIS — I25810 Atherosclerosis of coronary artery bypass graft(s) without angina pectoris: Secondary | ICD-10-CM

## 2017-11-19 DIAGNOSIS — E291 Testicular hypofunction: Secondary | ICD-10-CM | POA: Diagnosis not present

## 2017-11-19 DIAGNOSIS — E079 Disorder of thyroid, unspecified: Secondary | ICD-10-CM

## 2017-11-19 DIAGNOSIS — Z950 Presence of cardiac pacemaker: Secondary | ICD-10-CM

## 2017-11-19 DIAGNOSIS — F329 Major depressive disorder, single episode, unspecified: Secondary | ICD-10-CM

## 2017-11-19 DIAGNOSIS — R5383 Other fatigue: Secondary | ICD-10-CM | POA: Diagnosis not present

## 2017-11-19 DIAGNOSIS — Z8673 Personal history of transient ischemic attack (TIA), and cerebral infarction without residual deficits: Secondary | ICD-10-CM

## 2017-11-19 LAB — COMPREHENSIVE METABOLIC PANEL
ALBUMIN: 3.8 g/dL (ref 3.5–5.0)
ALT: 16 U/L (ref 0–44)
AST: 20 U/L (ref 15–41)
Alkaline Phosphatase: 53 U/L (ref 38–126)
Anion gap: 12 (ref 5–15)
BUN: 27 mg/dL — AB (ref 8–23)
CHLORIDE: 106 mmol/L (ref 98–111)
CO2: 20 mmol/L — ABNORMAL LOW (ref 22–32)
Calcium: 9 mg/dL (ref 8.9–10.3)
Creatinine, Ser: 1.33 mg/dL — ABNORMAL HIGH (ref 0.61–1.24)
GFR calc Af Amer: 57 mL/min — ABNORMAL LOW (ref >60)
GFR calc non Af Amer: 50 mL/min — ABNORMAL LOW (ref >60)
GLUCOSE: 105 mg/dL — AB (ref 70–99)
Potassium: 4.8 mmol/L (ref 3.5–5.1)
SODIUM: 138 mmol/L (ref 135–145)
Total Bilirubin: 0.5 mg/dL (ref 0.3–1.2)
Total Protein: 7.1 g/dL (ref 6.5–8.1)

## 2017-11-19 LAB — CBC WITH DIFFERENTIAL/PLATELET
Basophils Absolute: 0 10*3/uL (ref 0.0–0.1)
Basophils Relative: 1 %
EOS PCT: 7 %
Eosinophils Absolute: 0.3 10*3/uL (ref 0.0–0.5)
HCT: 48.3 % (ref 38.4–49.9)
Hemoglobin: 16.1 g/dL (ref 13.0–17.1)
LYMPHS ABS: 0.6 10*3/uL — AB (ref 0.9–3.3)
LYMPHS PCT: 14 %
MCH: 31.2 pg (ref 27.2–33.4)
MCHC: 33.3 g/dL (ref 32.0–36.0)
MCV: 93.6 fL (ref 79.3–98.0)
MONO ABS: 0.5 10*3/uL (ref 0.1–0.9)
Monocytes Relative: 11 %
Neutro Abs: 2.9 10*3/uL (ref 1.5–6.5)
Neutrophils Relative %: 67 %
PLATELETS: 150 10*3/uL (ref 140–400)
RBC: 5.16 MIL/uL (ref 4.20–5.82)
RDW: 13.9 % (ref 11.0–14.6)
WBC: 4.2 10*3/uL (ref 4.0–10.3)

## 2017-11-19 LAB — TSH: TSH: 2.907 u[IU]/mL (ref 0.320–4.118)

## 2017-11-19 MED ORDER — DENOSUMAB 120 MG/1.7ML ~~LOC~~ SOLN
SUBCUTANEOUS | Status: AC
Start: 2017-11-19 — End: ?
  Filled 2017-11-19: qty 1.7

## 2017-11-19 MED ORDER — DENOSUMAB 120 MG/1.7ML ~~LOC~~ SOLN
120.0000 mg | Freq: Once | SUBCUTANEOUS | Status: AC
  Administered 2017-11-19: 120 mg via SUBCUTANEOUS

## 2017-11-19 MED ORDER — SODIUM CHLORIDE 0.9 % IV SOLN
Freq: Once | INTRAVENOUS | Status: AC
  Administered 2017-11-19: 11:00:00 via INTRAVENOUS
  Filled 2017-11-19: qty 250

## 2017-11-19 MED ORDER — SODIUM CHLORIDE 0.9 % IV SOLN
240.0000 mg | Freq: Once | INTRAVENOUS | Status: AC
  Administered 2017-11-19: 240 mg via INTRAVENOUS
  Filled 2017-11-19: qty 24

## 2017-11-19 NOTE — Telephone Encounter (Signed)
Printed avs and calender of upcoming appointment. Per 8/12 los

## 2017-11-19 NOTE — Progress Notes (Signed)
[facility] [address], [place] [address] [phone]   Interval Evaluation  Date of Service: 11/19/17 Patient Name: David [name]. Patient MRN: [id number] Patient DOB: April 23, 1940 Provider: Ventura Sellers, MD  Identifying Statement:  David Sgro. is a 77 y.o. male with melanoma, brain metastases  Primary Cancer: Melanoma Stage IV  Oncologic History: Oncology History   Metastatic melanoma [facility])   Staging form: Melanoma of the Skin, AJCC 7th Edition     Clinical stage from 09/21/2015: Stage IV [place], [place], [place]) - Signed by [name], MD on 10/09/2015       Malignant melanoma ([place])   09/16/2015 Imaging    PET scan showed a hypermetabolic 3.7SE lingular nodule, and a 64m right upper lobe lung nodule, left subclavicular nodes, and bone metastasis in the proximal right humerus and right femur.     09/21/2015 Initial Diagnosis    [place]astatic [place]    09/21/2015 Initial Biopsy    Left subclavicular lymph node biopsy showed prostatic of melanoma    09/21/2015 Miscellaneous    BRAF [id number] mutation (+)     10/04/2015 Imaging    CT head with without contrast showed a 2.0 x 1.6 x 1.7 cm superficial left frontal hyperdense lesion, with postcontrast enhancement, lying within the previous hemorrhagic area, concerning for solitary melanoma metastasis    10/06/2015 - 09/06/2016 Chemotherapy    Nivolumab 2422mevery 2 weeks, held due to his dexamethasone for radionecrosis     10/13/2015 - 10/13/2015 Radiation Therapy    10/13/2015 Preop SRS treatment: 14 Gy  to the left frontal lesion in 1 fraction    10/15/2015 Surgery    left front craniotomy and tumor excision     10/15/2015 Pathology Results    left frontal brain mass excision showed metastatic melanoma, 1.5X1.5X0.6cm    01/13/2016 Imaging    CT head without and with contrast showed ill-defined enhancement and dural thickening of the left frontal resection cavity may  represent a post treatment changes or residual neoplasm. A new 6 mm hypodensity in the right cerebellar hemisphere with uncertainty enhancement may represent a metastasis or small brain parenchymal hemorrhage.     01/27/2016 - 01/27/2016 Radiation Therapy    01/27/16 SRS Treatment:  20 Gy in 1 fraction to a 6 mm right cerebellar brain met     03/29/2016 PET scan    PET 03/29/2016 IMPRESSION: 1. Overall there has been an interval response to therapy. The index lesions sided on previous exam it are less hypermetabolic when compared with the previous exam. 2. There is an intramuscular lesion within the right masseter which has increased FDG uptake compared with previous exam. 3. No new sites of disease identified. 4. Aortic atherosclerosis and infrarenal abdominal aortic aneurysm. Recommend followup by ultrasound in 3 years. This recommendation follows ACR consensus guidelines: White Paper of the ACR Incidental Findings Committee II on Vascular Findings. J Joellyn Ruedadiol 2013; 10:789-794    04/20/2016 Imaging    CT Head w wo contrast 1. Mild patchy enhancement at the left anterior frontal lobe treatment site (series 11, image 39) without mass effect and stable surrounding white matter hypodensity without strong evidence of acute vasogenic edema. Recommend continued CT surveillance. 2. No new metastatic disease or new intracranial abnormality identified. 3. Probable benign 14 mm left parietal bone lucent lesion, unchanged since May 2017.    05/06/2016 - 05/08/2016 Hospital Admission    Pt was admitted  for rectal bleeding for one day. Breathing spontaneously resolved, hemoglobin dropped from 15 to 10.5, did not require blood transfusion. Colonoscopy showed no active bleeding, except diverticulosis and hemorrhoid. He was discharged to home.    05/08/2016 Procedure    Colonoscopy  - Stool in the ascending colon. Fluid aspiration performed. - Diverticulosis in the entire examined colon. -  Non-bleeding internal hemorrhoids. - The examination was otherwise normal on direct and retroflexion views.    05/29/2016 Imaging    PET Image Restaging IMPRESSION: 1. Stable exam with no evidence progression. 2. Photopenia in the LEFT frontal lobe at prior surgical site. 3. Stable mildly hypermetabolic RIGHT paratracheal node. This may represent a thyroid nodule. 4. Stable size of minimally metabolic lingular pulmonary nodule. 5. Stable skeletal lesion of the RIGHT iliac crest 6. No cutaneous metabolic lesion.    07/20/2016 Imaging    CT Head W WO Contrast IMPRESSION: Pronounced change in the left frontal region. Much more regional vasogenic edema. Relatively stable appearance of the abnormal enhancement extending from the lateral margin of the frontal horn of left lateral ventricle to the frontal brain surface. New region of abnormal enhancement surrounding the frontal horn of the left lateral ventricle measuring approximately 3.2 cm. The differential diagnosis is recurrent tumor versus radiation necrosis. The patient is reportedly not an MRI candidate. This area is large enough that CT perfusion could possibly make a reasonable differentiation.    09/02/2016 Imaging    CT Head 09/02/16 IMPRESSION: Somewhat mixed pattern of slight posterior progression of pleomorphic enhancement surrounding the LEFT lateral ventricle although no significant mass effect, and reduced vasogenic edema compared with the scan in April.    09/27/2016 - 11/02/2016 Chemotherapy    Start dabrafenib and trametinib 09/27/16, stopped due to severe fatigue      10/05/2016 Imaging    CT Head w & w/o contrast  IMPRESSION: 1. Decreased size of the enhancing area within the left frontal lobe treatment site. Surrounding edema has also decreased. The findings support continued evolution of radiation necrosis. 2. No new enhancing lesions.    10/18/2016 PET scan    IMPRESSION: 1. New focal hypermetabolism in the  proximal sigmoid colon with associated focal colonic wall thickening and mild pericolonic fat stranding at the site of a proximal sigmoid diverticulum, favored to represent mild acute sigmoid diverticulitis. Metastatic disease is considered less likely. Clinical correlation is necessary. Consider appropriate antibiotic therapy, as clinically warranted. Attention to this region recommended on future PET-CT follow-up. 2. Otherwise complete metabolic response with no evidence of hypermetabolic metastatic disease. Lingular pulmonary nodule is slightly decreased in size and demonstrates no significant metabolism. 3. No residual hypermetabolism in the right thyroid lobe nodule, which is stable in size. 4. Aortic Atherosclerosis (ICD10-I70.0). Stable 3.1 cm infrarenal abdominal aortic aneurysm. Aortic aneurysm NOS (ICD10-I71.9). Recommend followup by ultrasound in 3 years. This recommendation follows ACR consensus guidelines: White Paper of the ACR Incidental Findings Committee II on Vascular Findings. Joellyn Rued Radiol 2013; 38:756-433.    11/01/2016 - 11/04/2016 Hospital Admission    Patient admitted to the hospital due to hypercalcemia where he received IV fluids and pamidronate    12/13/2016 PET scan    PET 12/13/16 IMPRESSION: 1. No findings to suggest metastatic disease on today's examination. 2. Previously noted hypermetabolism in the sigmoid colon has resolved, presumably indicative of a focus of diverticulitis on the prior study. Today's study does demonstrate relatively diffuse hypermetabolism throughout the cecum, ascending colon and proximal transverse colon where there is  some mild mural thickening, favored to reflect mild chronic inflammation related to underlying diverticular disease. No definite inflammatory changes on the CT portion of the examination to strongly suggest an active acute diverticulitis at this time. No discrete lesion to suggest neoplasm. 3. Previously noted 8  mm lingular nodule is stable in size and known remains non hypermetabolic. This is nonspecific but reassuring. Continued attention on future followup studies is recommended. 4. Aortic atherosclerosis, in addition to left main and 3 vessel coronary artery disease. Status post median sternotomy for CABG including LIMA to the LAD. In addition, there is mild fusiform aneurysmal dilatation of the infrarenal abdominal aorta (3.1 cm in diameter) as well as aneurysmal dilatation of the left common iliac    12/18/2016 -  Chemotherapy    Restarted nivolamab 224m every 2 weeks, with tapering dose dexamethasone      04/18/2017 Imaging    PET Scan IMPRESSION: 1. No hypermetabolic lesion to suggest active malignancy. 2. Stable 8 mm lingular nodule is not currently hypermetabolic but merit surveillance. Infrarenal abdominal aortic aneurysm 3.1 cm in diameter. Recommend followup by [facility] 3 years. 3. Other imaging findings of potential clinical significance: Postoperative findings in the left frontal lobe. Right renal cyst. Aortic Atherosclerosis (ICD10-I70.0). Osteoarthritis. Lumbar scoliosis. Left knee effusion. Colonic diverticulosis.     10/10/2017 PET scan    IMPRESSION: 1. Small focus of FDG accumulation in the subcutaneous fat of the right gluteal region, nonspecific. Attention on follow-up suggested. 2. Otherwise stable exam without new or progressive hypermetabolic disease in the interval since prior PET-CT. 3. Stable 8 mm nodule in the lingula. No evidence for hypermetabolism on PET imaging. 4.  Aortic Atherosclerois (ICD10-170.0)      Interval History:  David [name] presents today for follow up.  His daughter has noticed a change in the appearance/bulk of his left leg. The patient does not describe any change in function related to movement, sensation, coordination in the leg.  There are no fasciculations or involuntary movements. He otherwise denies seizures, headaches, neck or back  pain.     Medications: Current Outpatient Medications on File Prior to Visit  Medication Sig Dispense Refill  . acetaminophen (TYLENOL) 500 MG tablet Take 500 mg by mouth at bedtime.    .Marland KitchenamLODipine (NORVASC) 5 MG tablet Take 1 tablet (5 mg total) by mouth daily. 90 tablet 3  . aspirin EC 81 MG tablet Take 81 mg by mouth daily.    . fluocinonide cream (LIDEX) 0.05 % Apply topically 2 (two) times daily as needed. 30 g 1  . levothyroxine (SYNTHROID, LEVOTHROID) 137 MCG tablet TAKE 1 TABLET BY MOUTH ONCE DAILY BEFORE BREAKFAST 30 tablet 1  . metoprolol succinate (TOPROL-XL) 50 MG 24 hr tablet Take 1.5 tablets daily (772m. Take with or immediately following a meal. 135 tablet 3  . pentoxifylline (TRENTAL) 400 MG CR tablet Take 1 tablet (400 mg total) by mouth 2 (two) times daily. 60 tablet 6  . rosuvastatin (CRESTOR) 20 MG tablet TAKE ONE TABLET BY MOUTH AT BEDTIME 30 tablet 8  . sertraline (ZOLOFT) 100 MG tablet     . testosterone cypionate (DEPOTESTOTERONE CYPIONATE) 100 MG/ML injection Inject 1 mL (100 mg total) into the muscle every 14 (fourteen) days. 10 mL 0  . Turmeric 500 MG CAPS Take 2 capsules by mouth daily.    . vitamin E (VITAMIN E) 400 UNIT capsule Take 1 capsule (400 Units total) by mouth 2 (two) times daily. 60 capsule 6  Current Facility-Administered Medications on File Prior to Visit  Medication Dose Route Frequency Provider Last Rate Last Dose  . 0.9 %  sodium chloride infusion  1,000 mL Intravenous Once [name], MD        Allergies:  Allergies  Allergen Reactions  . Ezetimibe   . Simvastatin Other (See Comments)    Reaction:  Gave pt a fever  Fever - temp of 103.   In 2003   Past Medical History:  Past Medical History:  Diagnosis Date  . Anginal pain ([place])   . Arthritis    mostly hands  . Benign familial hematuria   . BPH (benign prostatic hypertrophy)   . Brain cancer ([place])    melanoma with brain met  . Coronary artery disease   . GERD (gastroesophageal  reflux disease)   . High cholesterol   . Hypertension   . Hypothyroidism   . Intracerebral hemorrhage ([place])   . Melanoma ([place])   . Metastatic melanoma to lung ([place])   . Mood disorder ([place])   . Myocardial infarction ([place])   . Pacemaker    due to syncope, 3rd degree HB (upgrade to [facility] Jude CRT-P 02/25/13 (Dr. Uvaldo Rising)  . Rectal bleed    due to NSAIDS  . Renal disorder   . Skin cancer    melanoma  . Sleep apnea    uses CPAP  . Stroke [facility])    Past Surgical History:  Past Surgical History:  Procedure Laterality Date  . APPLICATION OF CRANIAL NAVIGATION N/A 10/15/2015   Procedure: APPLICATION OF CRANIAL NAVIGATION;  Surgeon: Erline Levine, MD;  Location: [facility] NEURO ORS;  Service: Neurosurgery;  Laterality: N/A;  . CARDIAC CATHETERIZATION  2013  . CARDIAC SURGERY     bypass X 2  . COLONOSCOPY WITH PROPOFOL N/A 05/08/2016   Procedure: COLONOSCOPY WITH PROPOFOL;  Surgeon: Jonathon Bellows, MD;  Location[facility] ENDOSCOPY;  Service: Gastroenterology;  Laterality: N/A;  . CORONARY ARTERY BYPASS GRAFT  2013   LIMA-LAD, SVG-PDA 03/27/11 (Dr. Francee Gentile)  . CRANIOTOMY N/A 10/15/2015   Procedure: LEFT FRONTAL CRANIOTOMY TUMOR EXCISION with Curve;  Surgeon: Erline Levine, MD;  Location: [facility] NEURO ORS;  Service: Neurosurgery;  Laterality: N/A;  CRANIOTOMY TUMOR EXCISION  . EYE SURGERY    . FOOT SURGERY  08/2010  . JOINT REPLACEMENT Left    partial knee  . KNEE ARTHROSCOPY  12/2008  . LYMPH NODE BIOPSY    . PACEMAKER INSERTION    . TONSILLECTOMY     Social History:  Social History   Socioeconomic History  . Marital status: Married    Spouse name: Not on file  . Number of children: 2  . Years of education: Not on file  . Highest education level: Not on file  Occupational History  . Occupation: retired Pharmacist, community  Social Needs  . Financial resource strain: Not on file  . Food insecurity:    Worry: Not on file    Inability: Not on file  . Transportation needs:    Medical: Not on file    Non-medical:  Not on file  Tobacco Use  . Smoking status: Former Smoker    Packs/day: 1.00    Years: 3.00    Pack years: 3.00    Last attempt to quit: 04/10/1957    Years since quitting: 60.6  . Smokeless tobacco: Never Used  Substance and Sexual Activity  . Alcohol use: Yes    Alcohol/week: 40.0 standard drinks    Types: 30 Glasses of wine,  10 Cans of beer per week    Comment: 10 per week, wine per week  . Drug use: No  . Sexual activity: Never  Lifestyle  . Physical activity:    Days per week: Not on file    Minutes per session: Not on file  . Stress: Not on file  Relationships  . Social connections:    Talks on phone: Not on file    Gets together: Not on file    Attends religious service: Not on file    Active member of club or organization: Not on file    Attends meetings of clubs or organizations: Not on file    Relationship status: Not on file  . Intimate partner violence:    Fear of current or ex partner: Not on file    Emotionally abused: Not on file    Physically abused: Not on file    Forced sexual activity: Not on file  Other Topics Concern  . Not on file  Social History Narrative   Lives with wife [name]) with dementia in [facility]   Daughter [name] and son   Occ: retired Pharmacist, community practiced in [place]   Has living will   [name] is health care POA   Requests DNR-- done 09/14/15   Not sure about tube feeds   Family History:  Family History  Problem Relation Age of Onset  . Cancer Mother 69       lung   . Hypertension Mother   . Hypertension Father   . Heart attack Father   . Heart disease Father   . Rheum arthritis Sister   . Cancer Maternal Grandmother     Review of Systems: Constitutional: Denies fevers, chills or abnormal weight loss Eyes: Denies blurriness of vision Ears, nose, mouth, throat, and face: Denies mucositis or sore throat Respiratory: Denies cough, dyspnea or wheezes Cardiovascular: Denies palpitation, chest discomfort or  lower extremity swelling Gastrointestinal:  Denies nausea, constipation, diarrhea GU: Denies dysuria or incontinence Skin: Denies abnormal skin rashes Neurological: Per HPI Musculoskeletal: Denies joint pain, back or neck discomfort. No decrease in ROM Behavioral/Psych: Denies anxiety, disturbance in thought content, and mood instability   Physical Exam: Vitals:   11/19/17 1238  BP: (!) 146/98  Pulse: 74  Resp: 20  Temp: 97.6 F (36.4 C)  SpO2: 98%   KPS: 70. General: Alert, cooperative, pleasant, in no acute distress Head: Craniotomy scar noted, dry and intact. EENT: No conjunctival injection or scleral icterus. Oral mucosa moist Lungs: Resp effort normal Cardiac: Regular rate and rhythm Abdomen: Soft, non-distended abdomen Skin: No rashes cyanosis or petechiae. Extremities: No clubbing or edema  Neurologic Exam: Mental Status: Awake, alert, attentive to examiner. Oriented to self and environment. Language is fluent with intact comprehension.  Age advanced psychomotor slowing.  Limited insight at times.  Cranial Nerves: Visual acuity is grossly normal. Visual fields are full. Extra-ocular movements intact. No ptosis. Face is symmetric, tongue midline. Motor: Tone and bulk are normal. Power is impaired to 4+/5 right leg, flexors>extendors. Subtle atrophy appreciated in left proximal hip girdle musculature. Reflexes are symmetric, no pathologic reflexes present. Intact finger to nose bilaterally Sensory: Intact to light touch and temperature Gait: Mildly hemiparetic   Labs: I have reviewed the data as listed    Component Value Date/Time   NA 138 11/19/2017 0912   NA 140 03/26/2017 0928   K 4.8 11/19/2017 0912   K 4.7 03/26/2017 0928   CL 106 11/19/2017  0912   CO2 20 (L) 11/19/2017 0912   CO2 21 (L) 03/26/2017 0928   GLUCOSE 105 (H) 11/19/2017 0912   GLUCOSE 95 03/26/2017 0928   BUN 27 (H) 11/19/2017 0912   BUN 19.3 03/26/2017 0928   CREATININE 1.33 (H) 11/19/2017  0912   CREATININE 1.2 03/26/2017 0928   CALCIUM 9.0 11/19/2017 0912   CALCIUM 8.7 03/26/2017 0928   PROT 7.1 11/19/2017 0912   PROT 6.8 03/26/2017 0928   ALBUMIN 3.8 11/19/2017 0912   ALBUMIN 3.7 03/26/2017 0928   AST 20 11/19/2017 0912   AST 22 03/26/2017 0928   ALT 16 11/19/2017 0912   ALT 15 03/26/2017 0928   ALKPHOS 53 11/19/2017 0912   ALKPHOS 58 03/26/2017 0928   BILITOT 0.5 11/19/2017 0912   BILITOT 0.58 03/26/2017 0928   GFRNONAA 50 (L) 11/19/2017 0912   GFRAA 57 (L) 11/19/2017 0912   Lab Results  Component Value Date   WBC 4.2 11/19/2017   NEUTROABS 2.9 11/19/2017   HGB 16.1 11/19/2017   HCT 48.3 11/19/2017   MCV 93.6 11/19/2017   PLT 150 11/19/2017    Assessment/Plan 1. Brain metastases ([place])  2. Cognitive Impairment  Mr. Goldsborough is clinically stable today.    I agree with his daughter that there is mild atrophy of the proximal/hip girdle musculature in the left leg.  Patellar reflex is relatively increased compared to the right, although both sides are still normal.   Motor and sensory function are preserved in the limb.   Atrophy with subtle hyperreflexia most likely represents some degree of denervation from chronic myelopathy, usually from disc disease in this age group.  Would not be consistent with an acute cause of myelopathy such as compression from tumor.  Because of lack of symptoms, no further workup needed at this time.  He will continue to follow with Dr. Burr Medico for his systemic therapy.  We appreciate the opportunity to participate in the care of Jayro Mcmath..   All questions were answered. The patient knows to call the clinic with any problems, questions or concerns. No barriers to learning were detected.  The total time spent in the encounter was 15 minutes minutes and more than 50% was on counseling and review of test results   Ventura Sellers, MD Medical Director of Neuro-Oncology [facility] [place] 11/19/17 12:33 PM

## 2017-11-19 NOTE — Patient Instructions (Signed)
[facility] Discharge Instructions for Patients Receiving Chemotherapy  Today you received the following chemotherapy agents :  Opdivo.  To help prevent nausea and vomiting after your treatment, we encourage you to take your nausea medication as prescribed.   If you develop nausea and vomiting that is not controlled by your nausea medication, call the clinic.   BELOW ARE SYMPTOMS THAT SHOULD BE REPORTED IMMEDIATELY:  *FEVER GREATER THAN 100.5 F  *CHILLS WITH OR WITHOUT FEVER  NAUSEA AND VOMITING THAT IS NOT CONTROLLED WITH YOUR NAUSEA MEDICATION  *UNUSUAL SHORTNESS OF BREATH  *UNUSUAL BRUISING OR BLEEDING  TENDERNESS IN MOUTH AND THROAT WITH OR WITHOUT PRESENCE OF ULCERS  *URINARY PROBLEMS  *BOWEL PROBLEMS  UNUSUAL RASH Items with * indicate a potential emergency and should be followed up as soon as possible.  Feel free to call the clinic should you have any questions or concerns. The clinic phone number is [phone].  Please show the CHEMO ALERT CARD at check-in to the Emergency Department and triage nurse.   

## 2017-11-20 ENCOUNTER — Telehealth: Payer: Self-pay

## 2017-11-20 NOTE — Telephone Encounter (Signed)
Per 8/12 no los

## 2017-11-26 ENCOUNTER — Ambulatory Visit (INDEPENDENT_AMBULATORY_CARE_PROVIDER_SITE_OTHER): Payer: Medicare Other | Admitting: Family Medicine

## 2017-11-26 ENCOUNTER — Ambulatory Visit (INDEPENDENT_AMBULATORY_CARE_PROVIDER_SITE_OTHER)
Admission: RE | Admit: 2017-11-26 | Discharge: 2017-11-26 | Disposition: A | Discharge status: Home / Self Care | Payer: Medicare Other | Source: Ambulatory Visit | Attending: Family Medicine | Admitting: Family Medicine

## 2017-11-26 ENCOUNTER — Encounter: Payer: Self-pay | Admitting: Family Medicine

## 2017-11-26 VITALS — BP 112/78 | HR 76 | Temp 97.5°F | Ht 69.0 in | Wt 206.5 lb

## 2017-11-26 DIAGNOSIS — M19011 Primary osteoarthritis, right shoulder: Secondary | ICD-10-CM

## 2017-11-26 DIAGNOSIS — M25511 Pain in right shoulder: Secondary | ICD-10-CM

## 2017-11-26 DIAGNOSIS — I255 Ischemic cardiomyopathy: Secondary | ICD-10-CM | POA: Diagnosis not present

## 2017-11-26 DIAGNOSIS — I619 Nontraumatic intracerebral hemorrhage, unspecified: Secondary | ICD-10-CM | POA: Diagnosis not present

## 2017-11-26 DIAGNOSIS — M7501 Adhesive capsulitis of right shoulder: Secondary | ICD-10-CM | POA: Diagnosis not present

## 2017-11-26 MED ORDER — METHYLPREDNISOLONE ACETATE 40 MG/ML IJ SUSP
80.0000 mg | Freq: Once | INTRAMUSCULAR | Status: AC
  Administered 2017-11-26: 80 mg via INTRA_ARTICULAR

## 2017-11-26 NOTE — Progress Notes (Signed)
Dr. Frederico Hamman T. Tenita Cue, MD, [facility] [place], [address] Phone: [phone] Fax: [phone]  11/26/2017  Patient: David [name]., MRN: [id number], DOB: [birth year], 77 y.o.  Primary Physician:  [name], MD   Chief Complaint  Patient presents with  . Shoulder Pain    Right   Subjective:   David [name]. is a 77 y.o. very pleasant male patient who presents with the following: shoulder pain  The patient noted above presents with shoulder pain that has been ongoing for approx 2 years since his prior stroke.  there is no history of trauma or accident. The patient denies neck pain or radicular symptoms. No shoulder blade pain Denies dislocation, subluxation, separation of the shoulder. The patient does complain of pain with flexion, abduction, and terminal motion.  Significant restriction of motion. he describes a deep ache around the shoulder, and sometimes it will wake the patient up at night.  He does have stage IV melanoma.  No cancer now Did not do much with it over the last couple years. ROM is decreased.   No old injuries to the R shoulder.  Going to the gym some now, does some exercises and some leg work.   08/2015 had his stroke.  Medications Tried: tylenol, limited with CKD Ice or Heat: minimal help Tried PT: No  Prior shoulder Injury: No Prior surgery: No Prior fracture: No  Past Medical History, Surgical History, Social History, Family History, Medications, and allergies reviewed and updated if relevant.   Patient Active Problem List   Diagnosis Date Noted  . Hx of CABG 07/17/2017  . Alcohol abuse 04/19/2017  . Cognitive impairment 02/25/2017  . History of implantation of joint prosthesis of elbow 02/12/2017  . Osteoarthritis of knee 02/12/2017  . Hypercalcemia 11/01/2016  . Nontraumatic cortical hemorrhage of left cerebral hemisphere ([place]) 06/27/2016  .  Hematochezia 05/06/2016  . Advanced care planning/counseling discussion 05/03/2016  . Atherosclerosis of native coronary artery of native heart with stable angina pectoris ([place]) 04/24/2016  . Ischemic cardiomyopathy 04/24/2016  . Acquired hypothyroidism 11/24/2015  . Hypotestosteronemia 11/24/2015  . Metastatic melanoma to lung ([place])   . Mood disorder ([place])   . Benign prostatic hyperplasia   . Metastasis to brain ([place]) 10/15/2015  . Malignant melanoma ([place]) 09/22/2015  . Lung mass   . Chronic kidney disease, stage III (moderate) (HCC)   . Hyperlipidemia 08/26/2015  . Obesity, Class I, BMI 30.0-34.9 (see actual BMI) 08/26/2015  . Incontinence 08/26/2015  . Hemorrhagic stroke ([place]) 08/26/2015  . Hemiplegia and hemiparesis following nontraumatic intracerebral hemorrhage affecting right dominant side ([place]) 08/26/2015  . Aphasia following nontraumatic intracerebral hemorrhage 08/26/2015  . Gait disturbance, post-stroke 08/26/2015  . Benign essential HTN   . OSA on CPAP   . Biventricular ICD (implantable cardioverter-defibrillator) in place   . ICH (intracerebral hemorrhage) (HCC) - L frontal due to HTN vs CAA 08/22/2015    Past Medical History:  Diagnosis Date  . Anginal pain ([place])   . Arthritis    mostly hands  . Benign familial hematuria   . BPH (benign prostatic hypertrophy)   . Brain cancer ([place])    melanoma with brain met  . Coronary artery disease   . GERD (gastroesophageal reflux disease)   . High cholesterol   . Hypertension   . Hypothyroidism   . Intracerebral hemorrhage ([place])   . Melanoma ([place])   . Metastatic melanoma to lung ([place])   .  Mood disorder ([place])   . Myocardial infarction ([place])   . Pacemaker    due to syncope, 3rd degree HB (upgrade to [facility]. Jude CRT-P 02/25/13 (Dr. Uvaldo Rising)  . Rectal bleed    due to NSAIDS  . Renal disorder   . Skin cancer    melanoma  . Sleep apnea    uses CPAP  . Stroke [facility])     Past Surgical History:  Procedure Laterality Date    . APPLICATION OF CRANIAL NAVIGATION N/A 10/15/2015   Procedure: APPLICATION OF CRANIAL NAVIGATION;  Surgeon: Erline Levine, MD;  Location: [facility] NEURO ORS;  Service: Neurosurgery;  Laterality: N/A;  . CARDIAC CATHETERIZATION  2013  . CARDIAC SURGERY     bypass X 2  . COLONOSCOPY WITH PROPOFOL N/A 05/08/2016   Procedure: COLONOSCOPY WITH PROPOFOL;  Surgeon: Jonathon Bellows, MD;  Location: [facility] ENDOSCOPY;  Service: Gastroenterology;  Laterality: N/A;  . CORONARY ARTERY BYPASS GRAFT  2013   LIMA-LAD, SVG-PDA 03/27/11 (Dr. Francee Gentile)  . CRANIOTOMY N/A 10/15/2015   Procedure: LEFT FRONTAL CRANIOTOMY TUMOR EXCISION with Curve;  Surgeon: Erline Levine, MD;  Location: [facility] NEURO ORS;  Service: Neurosurgery;  Laterality: N/A;  CRANIOTOMY TUMOR EXCISION  . EYE SURGERY    . FOOT SURGERY  08/2010  . JOINT REPLACEMENT Left    partial knee  . KNEE ARTHROSCOPY  12/2008  . LYMPH NODE BIOPSY    . PACEMAKER INSERTION    . TONSILLECTOMY      Social History   Socioeconomic History  . Marital status: Married    Spouse name: Not on file  . Number of children: 2  . Years of education: Not on file  . Highest education level: Not on file  Occupational History  . Occupation: retired Pharmacist, community  Social Needs  . Financial resource strain: Not on file  . Food insecurity:    Worry: Not on file    Inability: Not on file  . Transportation needs:    Medical: Not on file    Non-medical: Not on file  Tobacco Use  . Smoking status: Former Smoker    Packs/day: 1.00    Years: 3.00    Pack years: 3.00    Last attempt to quit: 04/10/1957    Years since quitting: 60.6  . Smokeless tobacco: Never Used  Substance and Sexual Activity  . Alcohol use: Yes    Alcohol/week: 40.0 standard drinks    Types: 30 Glasses of wine, 10 Cans of beer per week    Comment: 10 per week, wine per week  . Drug use: No  . Sexual activity: Never  Lifestyle  . Physical activity:    Days per week: Not on file    Minutes per session: Not on file  .  Stress: Not on file  Relationships  . Social connections:    Talks on phone: Not on file    Gets together: Not on file    Attends religious service: Not on file    Active member of club or organization: Not on file    Attends meetings of clubs or organizations: Not on file    Relationship status: Not on file  . Intimate partner violence:    Fear of current or ex partner: Not on file    Emotionally abused: Not on file    Physically abused: Not on file    Forced sexual activity: Not on file  Other Topics Concern  . Not on file  Social History Narrative   Lives  with wife [name]) with dementia in [facility]   Daughter [name] and son   Occ: retired Pharmacist, community practiced in [place]   Has living will   [name] is health care POA   Requests DNR-- done 09/14/15   Not sure about tube feeds    Family History  Problem Relation Age of Onset  . Cancer Mother 30       lung   . Hypertension Mother   . Hypertension Father   . Heart attack Father   . Heart disease Father   . Rheum arthritis Sister   . Cancer Maternal Grandmother     Allergies  Allergen Reactions  . Ezetimibe   . Simvastatin Other (See Comments)    Reaction:  Gave pt a fever  Fever - temp of 103.   In 2003    Medication list reviewed and updated in full in [facility].  GEN: No fevers, chills. Nontoxic. Primarily MSK c/o today. MSK: Detailed in the HPI GI: tolerating PO intake without difficulty Neuro: No numbness, parasthesias, or tingling associated. Otherwise the pertinent positives of the ROS are noted above.    Objective:   Blood pressure 112/78, pulse 76, temperature (!) 97.5 F (36.4 C), temperature source Oral, height _0  (1.753 m), weight 206 lb 8 oz (93.7 kg).   GEN: WDWN, NAD, Non-toxic, Alert & Oriented x 3 HEENT: Atraumatic, Normocephalic.  Ears and Nose: No external deformity. EXTR: No clubbing/cyanosis/edema NEURO: Normal gait.  PSYCH: Normally interactive.  Conversant. Not depressed or anxious appearing.  Calm demeanor.   Shoulder: R and L Inspection: No muscle wasting or winging Ecchymosis/edema: neg  AC joint, scapula, clavicle: NT Cervical spine: NT, full ROM Spurling's: neg ABNORMAL SIDE TESTED: R UNLESS OTHERWISE NOTED, THE CONTRALATERAL SIDE HAS FULL RANGE OF MOTION. Abduction: 5/5, LIMITED TO 145 DEGREES Flexion: 5/5, LIMITED TO 150 DEGNO ROM  IR, lift-off: 5/5. TESTED AT 90 DEGREES OF ABDUCTION, LIMITED TO 0 DEGREES ER at neutral:  5/5, TESTED AT 90 DEGREES OF ABDUCTION, LIMITED TO 80 DEGREES AC crossover and compression: PAIN Drop Test: neg Empty Can: neg Supraspinatus insertion: NT Bicipital groove: NT ALL OTHER SPECIAL TESTING EQUIVOCAL GIVEN LOSS OF MOTION C5-T1 intact Sensation intact Grip 5/5   Dg Shoulder Right  Result Date: 11/26/2017 CLINICAL DATA:  Decreased range of motion, pain. EXAM: RIGHT SHOULDER - 2+ VIEW COMPARISON:  None. FINDINGS: There is osteophytosis in the glenohumeral joint. Mild sclerosis along the glenoid. Prominent osteophytosis along the right acromioclavicular joint. Visualized portion of the right chest is unremarkable. IMPRESSION: Glenohumeral and acromioclavicular joint osteoarthritis. Electronically Signed   By: Lorin Picket M.D.   On: 11/26/2017 14:55    Assessment and Plan:   Adhesive capsulitis of right shoulder - Plan: Ambulatory referral to Physical Therapy, methylPREDNISolone acetate (DEPO-MEDROL) injection 80 mg  Acute pain of right shoulder - Plan: DG Shoulder Right  Hemorrhagic stroke (HCC)  Glenohumeral arthritis, right   >25 minutes spent in face to face time with patient, >50% spent in counselling or coordination of care   Secondary frozen shoulder after the patient's stroke and treatment for metastatic cancer.  He also has some glenohumeral arthritis, but this is only mild to moderate in character.  Strong suspicion that the restriction of motion is all secondary to  adhesive capsulitis.  I think that it is reasonable that he can get this to resolve or improve, and his goal is to be able to play golf and have a higher  quality of life.  I think that that is a reasonable goal with a reasonable likelihood of success.  Patient was given a systematic ROM protocol from [facility] to be done daily. Emphasized importance of adherence, help of PT, daily HEP. Intraarticular shoulder injections discussed with patient, which have good evidence for accelerating the thawing phase.  Patient will be sent for formal PT for aggressive frozen shoulder ROM. Will need RTC str and scapular stabilization to fix underlying mechanics.  Intrarticular Shoulder Injection, R Verbal consent was obtained from the patient. Risks including infection explained and contrasted with benefits and alternatives. Patient prepped with Chloraprep and Ethyl Chloride used for anesthesia. An intraarticular shoulder injection was performed using the posterior approach. The patient tolerated the procedure well and had decreased pain post injection. No complications. Injection: 8 cc of Lidocaine 1% and 2 mL Depo-Medrol 40 mg. Needle: 21 gauge, 2 inch  Follow-up: 2 mo  Meds ordered this encounter  Medications  . methylPREDNISolone acetate (DEPO-MEDROL) injection 80 mg   Orders Placed This Encounter  Procedures  . DG Shoulder Right  . Ambulatory referral to Physical Therapy    Signed,  Frederico Hamman T. Cain Fitzhenry, MD   Patient's Medications  New Prescriptions   No medications on file  Previous Medications   ACETAMINOPHEN (TYLENOL) 500 MG TABLET    Take 500 mg by mouth at bedtime.   AMLODIPINE (NORVASC) 5 MG TABLET    Take 1 tablet (5 mg total) by mouth daily.   ASPIRIN EC 81 MG TABLET    Take 81 mg by mouth daily.   FLUOCINONIDE CREAM (LIDEX) 0.05 %    Apply topically 2 (two) times daily as needed.   LEVOTHYROXINE (SYNTHROID, LEVOTHROID) 137 MCG TABLET    TAKE 1 TABLET BY MOUTH ONCE DAILY BEFORE  BREAKFAST   METOPROLOL SUCCINATE (TOPROL-XL) 50 MG 24 HR TABLET    Take 1.5 tablets daily (58m). Take with or immediately following a meal.   PENTOXIFYLLINE (TRENTAL) 400 MG CR TABLET    Take 1 tablet (400 mg total) by mouth 2 (two) times daily.   ROSUVASTATIN (CRESTOR) 20 MG TABLET    TAKE ONE TABLET BY MOUTH AT BEDTIME   SERTRALINE (ZOLOFT) 100 MG TABLET       TESTOSTERONE CYPIONATE (DEPOTESTOTERONE CYPIONATE) 100 MG/ML INJECTION    Inject 1 mL (100 mg total) into the muscle every 14 (fourteen) days.   TURMERIC 500 MG CAPS    Take 2 capsules by mouth daily.   VITAMIN E (VITAMIN E) 400 UNIT CAPSULE    Take 1 capsule (400 Units total) by mouth 2 (two) times daily.  Modified Medications   No medications on file  Discontinued Medications   No medications on file

## 2017-11-26 NOTE — Patient Instructions (Signed)
REFERRALS TO SPECIALISTS, SPECIAL TESTS (MRI, CT, ULTRASOUNDS)  MAR[facility] or  Anastasiya will help you. ASK CHECK-IN FOR HELP.  Specialist appointment times vary a great deal, based on their schedule / openings. -- Some specialists have very long wait times. (Example. Dermatology)    

## 2017-11-30 ENCOUNTER — Telehealth: Payer: Self-pay

## 2017-11-30 NOTE — Telephone Encounter (Signed)
Returned patient call to verify appointment time and to assist him with any changes and was unable to leave a message or speak to the patient. Per 8/22 phone msg return

## 2017-12-03 ENCOUNTER — Inpatient Hospital Stay (HOSPITAL_BASED_OUTPATIENT_CLINIC_OR_DEPARTMENT_OTHER): Payer: Medicare Other | Admitting: Hematology

## 2017-12-03 ENCOUNTER — Inpatient Hospital Stay: Payer: Medicare Other

## 2017-12-03 ENCOUNTER — Encounter: Payer: Self-pay | Admitting: Hematology

## 2017-12-03 VITALS — BP 126/83 | HR 77 | Temp 97.8°F | Resp 17 | Ht 69.0 in | Wt 206.4 lb

## 2017-12-03 DIAGNOSIS — C7951 Secondary malignant neoplasm of bone: Secondary | ICD-10-CM | POA: Diagnosis not present

## 2017-12-03 DIAGNOSIS — C439 Malignant melanoma of skin, unspecified: Secondary | ICD-10-CM | POA: Diagnosis not present

## 2017-12-03 DIAGNOSIS — E291 Testicular hypofunction: Secondary | ICD-10-CM | POA: Diagnosis not present

## 2017-12-03 DIAGNOSIS — I129 Hypertensive chronic kidney disease with stage 1 through stage 4 chronic kidney disease, or unspecified chronic kidney disease: Secondary | ICD-10-CM | POA: Diagnosis not present

## 2017-12-03 DIAGNOSIS — C7931 Secondary malignant neoplasm of brain: Secondary | ICD-10-CM | POA: Diagnosis not present

## 2017-12-03 DIAGNOSIS — C799 Secondary malignant neoplasm of unspecified site: Secondary | ICD-10-CM

## 2017-12-03 DIAGNOSIS — I619 Nontraumatic intracerebral hemorrhage, unspecified: Secondary | ICD-10-CM

## 2017-12-03 DIAGNOSIS — Z8673 Personal history of transient ischemic attack (TIA), and cerebral infarction without residual deficits: Secondary | ICD-10-CM

## 2017-12-03 DIAGNOSIS — F329 Major depressive disorder, single episode, unspecified: Secondary | ICD-10-CM | POA: Diagnosis not present

## 2017-12-03 DIAGNOSIS — I25810 Atherosclerosis of coronary artery bypass graft(s) without angina pectoris: Secondary | ICD-10-CM

## 2017-12-03 DIAGNOSIS — I10 Essential (primary) hypertension: Secondary | ICD-10-CM

## 2017-12-03 DIAGNOSIS — R4189 Other symptoms and signs involving cognitive functions and awareness: Secondary | ICD-10-CM

## 2017-12-03 DIAGNOSIS — N183 Chronic kidney disease, stage 3 unspecified: Secondary | ICD-10-CM

## 2017-12-03 DIAGNOSIS — E079 Disorder of thyroid, unspecified: Secondary | ICD-10-CM | POA: Diagnosis not present

## 2017-12-03 DIAGNOSIS — R5383 Other fatigue: Secondary | ICD-10-CM

## 2017-12-03 DIAGNOSIS — Z950 Presence of cardiac pacemaker: Secondary | ICD-10-CM | POA: Diagnosis not present

## 2017-12-03 DIAGNOSIS — C770 Secondary and unspecified malignant neoplasm of lymph nodes of head, face and neck: Secondary | ICD-10-CM | POA: Diagnosis not present

## 2017-12-03 DIAGNOSIS — C7800 Secondary malignant neoplasm of unspecified lung: Secondary | ICD-10-CM | POA: Diagnosis not present

## 2017-12-03 DIAGNOSIS — Z5112 Encounter for antineoplastic immunotherapy: Secondary | ICD-10-CM | POA: Diagnosis not present

## 2017-12-03 LAB — CBC WITH DIFFERENTIAL/PLATELET
Basophils Absolute: 0 10*3/uL (ref 0.0–0.1)
Basophils Relative: 0 %
Eosinophils Absolute: 0.3 10*3/uL (ref 0.0–0.5)
Eosinophils Relative: 6 %
HCT: 47.8 % (ref 38.4–49.9)
Hemoglobin: 16.2 g/dL (ref 13.0–17.1)
LYMPHS ABS: 0.5 10*3/uL — AB (ref 0.9–3.3)
LYMPHS PCT: 10 %
MCH: 31.2 pg (ref 27.2–33.4)
MCHC: 33.9 g/dL (ref 32.0–36.0)
MCV: 92.1 fL (ref 79.3–98.0)
MONOS PCT: 9 %
Monocytes Absolute: 0.5 10*3/uL (ref 0.1–0.9)
Neutro Abs: 4.2 10*3/uL (ref 1.5–6.5)
Neutrophils Relative %: 75 %
PLATELETS: 158 10*3/uL (ref 140–400)
RBC: 5.19 MIL/uL (ref 4.20–5.82)
RDW: 13.7 % (ref 11.0–14.6)
WBC: 5.5 10*3/uL (ref 4.0–10.3)

## 2017-12-03 LAB — COMPREHENSIVE METABOLIC PANEL
ALBUMIN: 3.6 g/dL (ref 3.5–5.0)
ALT: 28 U/L (ref 0–44)
AST: 24 U/L (ref 15–41)
Alkaline Phosphatase: 54 U/L (ref 38–126)
Anion gap: 10 (ref 5–15)
BUN: 29 mg/dL — ABNORMAL HIGH (ref 8–23)
CHLORIDE: 105 mmol/L (ref 98–111)
CO2: 25 mmol/L (ref 22–32)
Calcium: 9.5 mg/dL (ref 8.9–10.3)
Creatinine, Ser: 1.45 mg/dL — ABNORMAL HIGH (ref 0.61–1.24)
GFR calc Af Amer: 52 mL/min — ABNORMAL LOW (ref >60)
GFR, EST NON AFRICAN AMERICAN: 45 mL/min — AB (ref >60)
GLUCOSE: 138 mg/dL — AB (ref 70–99)
POTASSIUM: 4.5 mmol/L (ref 3.5–5.1)
SODIUM: 140 mmol/L (ref 135–145)
Total Bilirubin: 0.5 mg/dL (ref 0.3–1.2)
Total Protein: 6.7 g/dL (ref 6.5–8.1)

## 2017-12-03 LAB — TSH: TSH: 5.285 u[IU]/mL — AB (ref 0.320–4.118)

## 2017-12-03 MED ORDER — SODIUM CHLORIDE 0.9 % IV SOLN
240.0000 mg | Freq: Once | INTRAVENOUS | Status: AC
  Administered 2017-12-03: 240 mg via INTRAVENOUS
  Filled 2017-12-03: qty 24

## 2017-12-03 MED ORDER — SODIUM CHLORIDE 0.9 % IV SOLN
Freq: Once | INTRAVENOUS | Status: AC
  Administered 2017-12-03: 12:00:00 via INTRAVENOUS
  Filled 2017-12-03: qty 250

## 2017-12-03 NOTE — Progress Notes (Signed)
[place]  [phone] [phone]  Clinic Follow up Note   Patient Care Team: [name], MD as PCP - General (Family Medicine)   Date of Service:  12/03/2017  Chief complaint: Follow-up metastatic melanoma   Oncology History   Metastatic melanoma [facility])   Staging form: Melanoma of the Skin, AJCC 7th Edition     Clinical stage from 09/21/2015: Stage IV [place], [place], [place]) - Signed by [name], MD on 10/09/2015       Malignant melanoma ([place])   09/16/2015 Imaging    PET scan showed a hypermetabolic 3.9JQ lingular nodule, and a 13m right upper lobe lung nodule, left subclavicular nodes, and bone metastasis in the proximal right humerus and right femur.     09/21/2015 Initial Diagnosis    Metastatic melanoma ([place]    09/21/2015 Initial Biopsy    Left subclavicular lymph node biopsy showed prostatic of melanoma    09/21/2015 Miscellaneous    BRAF V600K mutation (+)     10/04/2015 Imaging    CT head with without contrast showed a 2.0 x 1.6 x 1.7 cm superficial left frontal hyperdense lesion, with postcontrast enhancement, lying within the previous hemorrhagic area, concerning for solitary melanoma metastasis    10/06/2015 - 09/06/2016 Chemotherapy    Nivolumab '240mg'$  every 2 weeks, held due to his dexamethasone for radionecrosis     10/13/2015 - 10/13/2015 Radiation Therapy    10/13/2015 Preop SRS treatment: 14 Gy  to the left frontal lesion in 1 fraction    10/15/2015 Surgery    left front craniotomy and tumor excision     10/15/2015 Pathology Results    left frontal brain mass excision showed metastatic melanoma, 1.5X1.5X0.6cm    01/13/2016 Imaging    CT head without and with contrast showed ill-defined enhancement and dural thickening of the left frontal resection cavity may represent a post treatment changes or residual neoplasm. A new 6 mm hypodensity in the right cerebellar hemisphere with uncertainty enhancement may represent a metastasis or small  brain parenchymal hemorrhage.     01/27/2016 - 01/27/2016 Radiation Therapy    01/27/16 SRS Treatment:  20 Gy in 1 fraction to a 6 mm right cerebellar brain met     03/29/2016 PET scan    PET 03/29/2016 IMPRESSION: 1. Overall there has been an interval response to therapy. The index lesions sided on previous exam it are less hypermetabolic when compared with the previous exam. 2. There is an intramuscular lesion within the right masseter which has increased FDG uptake compared with previous exam. 3. No new sites of disease identified. 4. Aortic atherosclerosis and infrarenal abdominal aortic aneurysm. Recommend followup by ultrasound in 3 years. This recommendation follows ACR consensus guidelines: White Paper of the ACR Incidental Findings Committee II on Vascular Findings. JJoellyn RuedRadiol 2013; 10:789-794    04/20/2016 Imaging    CT Head w wo contrast 1. Mild patchy enhancement at the left anterior frontal lobe treatment site (series 11, image 39) without mass effect and stable surrounding white matter hypodensity without strong evidence of acute vasogenic edema. Recommend continued CT surveillance. 2. No new metastatic disease or new intracranial abnormality identified. 3. Probable benign 14 mm left parietal bone lucent lesion, unchanged since May 2017.    05/06/2016 - 05/08/2016 Hospital Admission    Pt was admitted for rectal bleeding for one day. Breathing spontaneously resolved, hemoglobin dropped from 15 to 10.5, did not require blood transfusion. Colonoscopy showed no active bleeding, except diverticulosis  and hemorrhoid. He was discharged to home.    05/08/2016 Procedure    Colonoscopy  - Stool in the ascending colon. Fluid aspiration performed. - Diverticulosis in the entire examined colon. - Non-bleeding internal hemorrhoids. - The examination was otherwise normal on direct and retroflexion views.    05/29/2016 Imaging    PET Image Restaging IMPRESSION: 1. Stable  exam with no evidence progression. 2. Photopenia in the LEFT frontal lobe at prior surgical site. 3. Stable mildly hypermetabolic RIGHT paratracheal node. This may represent a thyroid nodule. 4. Stable size of minimally metabolic lingular pulmonary nodule. 5. Stable skeletal lesion of the RIGHT iliac crest 6. No cutaneous metabolic lesion.    07/20/2016 Imaging    CT Head W WO Contrast IMPRESSION: Pronounced change in the left frontal region. Much more regional vasogenic edema. Relatively stable appearance of the abnormal enhancement extending from the lateral margin of the frontal horn of left lateral ventricle to the frontal brain surface. New region of abnormal enhancement surrounding the frontal horn of the left lateral ventricle measuring approximately 3.2 cm. The differential diagnosis is recurrent tumor versus radiation necrosis. The patient is reportedly not an MRI candidate. This area is large enough that CT perfusion could possibly make a reasonable differentiation.    09/02/2016 Imaging    CT Head 09/02/16 IMPRESSION: Somewhat mixed pattern of slight posterior progression of pleomorphic enhancement surrounding the LEFT lateral ventricle although no significant mass effect, and reduced vasogenic edema compared with the scan in April.    09/27/2016 - 11/02/2016 Chemotherapy    Start dabrafenib and trametinib 09/27/16, stopped due to severe fatigue      10/05/2016 Imaging    CT Head w & w/o contrast  IMPRESSION: 1. Decreased size of the enhancing area within the left frontal lobe treatment site. Surrounding edema has also decreased. The findings support continued evolution of radiation necrosis. 2. No new enhancing lesions.    10/18/2016 PET scan    IMPRESSION: 1. New focal hypermetabolism in the proximal sigmoid colon with associated focal colonic wall thickening and mild pericolonic fat stranding at the site of a proximal sigmoid diverticulum, favored to represent mild  acute sigmoid diverticulitis. Metastatic disease is considered less likely. Clinical correlation is necessary. Consider appropriate antibiotic therapy, as clinically warranted. Attention to this region recommended on future PET-CT follow-up. 2. Otherwise complete metabolic response with no evidence of hypermetabolic metastatic disease. Lingular pulmonary nodule is slightly decreased in size and demonstrates no significant metabolism. 3. No residual hypermetabolism in the right thyroid lobe nodule, which is stable in size. 4. Aortic Atherosclerosis (ICD10-I70.0). Stable 3.1 cm infrarenal abdominal aortic aneurysm. Aortic aneurysm NOS (ICD10-I71.9). Recommend followup by ultrasound in 3 years. This recommendation follows ACR consensus guidelines: White Paper of the ACR Incidental Findings Committee II on Vascular Findings. Joellyn Rued Radiol 2013; 32:951-884.    11/01/2016 - 11/04/2016 Hospital Admission    Patient admitted to the hospital due to hypercalcemia where he received IV fluids and pamidronate    12/13/2016 PET scan    PET 12/13/16 IMPRESSION: 1. No findings to suggest metastatic disease on today's examination. 2. Previously noted hypermetabolism in the sigmoid colon has resolved, presumably indicative of a focus of diverticulitis on the prior study. Today's study does demonstrate relatively diffuse hypermetabolism throughout the cecum, ascending colon and proximal transverse colon where there is some mild mural thickening, favored to reflect mild chronic inflammation related to underlying diverticular disease. No definite inflammatory changes on the CT portion of the examination to  strongly suggest an active acute diverticulitis at this time. No discrete lesion to suggest neoplasm. 3. Previously noted 8 mm lingular nodule is stable in size and known remains non hypermetabolic. This is nonspecific but reassuring. Continued attention on future followup studies is recommended. 4.  Aortic atherosclerosis, in addition to left main and 3 vessel coronary artery disease. Status post median sternotomy for CABG including LIMA to the LAD. In addition, there is mild fusiform aneurysmal dilatation of the infrarenal abdominal aorta (3.1 cm in diameter) as well as aneurysmal dilatation of the left common iliac    12/18/2016 -  Chemotherapy    Restarted nivolamab '240mg'$  every 2 weeks, with tapering dose dexamethasone      04/18/2017 Imaging    PET Scan IMPRESSION: 1. No hypermetabolic lesion to suggest active malignancy. 2. Stable 8 mm lingular nodule is not currently hypermetabolic but merit surveillance. Infrarenal abdominal aortic aneurysm 3.1 cm in diameter. Recommend followup by [place] in 3 years. 3. Other imaging findings of potential clinical significance: Postoperative findings in the left frontal lobe. Right renal cyst. Aortic Atherosclerosis (ICD10-I70.0). Osteoarthritis. Lumbar scoliosis. Left knee effusion. Colonic diverticulosis.     10/10/2017 PET scan    IMPRESSION: 1. Small focus of FDG accumulation in the subcutaneous fat of the right gluteal region, nonspecific. Attention on follow-up suggested. 2. Otherwise stable exam without new or progressive hypermetabolic disease in the interval since prior PET-CT. 3. Stable 8 mm nodule in the lingula. No evidence for hypermetabolism on PET imaging. 4.  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 He was found to have left frontal hemorrhage 5.7 cm x 4.5 cm with 6 mm left to right shift.During the workup for his stroke, a left upper lung mass was noticed on the CT scan. I was called to evaluate his lung mass.  Patient has aphasia, not able to communicate much. According to his daughter, Dr. [name], who is a OB GYN at [employer], he probably only smoked for very  short period time during the college. He did have 3 skin melanoma, which was removed, last one was 2-3 years ago. No other cancer history.  CURRENT THERAPY:   -Monthly Xgeva starting 02/2016 -Restarted nivolamab on 12/18/2016 and changed to every 4 weeks on 01/30/2017, changed back to every 2 weeks due to fatigue on 04/23/17  INTERVAL HISTORY:   David [name]. returns for follow-up and ongoing treatment. He presents to the clinic today by himself. He states that his left eyelid was tender a few days ago, and is improving now. He denies blurry vision or headaches. He takes 25 mg sertraline now. He states that his motivation is low, but he tries to go to PT and stay active.     REVIEW OF SYSTEMS:   Constitutional: Denies fevers, chills or abnormal weight loss  Eyes: Denies blurriness of vision (+) left eyelid swelling Ears, nose, mouth, throat, and face: Denies mucositis or sore throat Respiratory: Denies cough, dyspnea or wheezes Cardiovascular: Denies palpitation, chest discomfort or lower extremity swelling Gastrointestinal:  Denies nausea, heartburn, hematochezia  Skin: Negative. Lymphatics: Denies new lymphadenopathy or easy bruising Neurological: (+) sleep difficulties  MSK: (+) Arthritis pain Behavioral/Psych: Mood is stable, no new changes  All other systems were reviewed with the patient and are negative.  MEDICAL HISTORY:  Past Medical History:  Diagnosis Date  .  Anginal pain ([place])   . Arthritis    mostly hands  . Benign familial hematuria   . BPH (benign prostatic hypertrophy)   . Brain cancer ([place])    melanoma with brain met  . Coronary artery disease   . GERD (gastroesophageal reflux disease)   . High cholesterol   . Hypertension   . Hypothyroidism   . Intracerebral hemorrhage ([place])   . Melanoma ([place])   . Metastatic melanoma to lung ([place])   . Mood disorder ([place])   . Myocardial infarction ([place])   . Pacemaker    due to syncope, 3rd degree HB (upgrade to  [facility]. Jude CRT-P 02/25/13 (Dr. Uvaldo Rising)  . Rectal bleed    due to NSAIDS  . Renal disorder   . Skin cancer    melanoma  . Sleep apnea    uses CPAP  . Stroke [facility])     SURGICAL HISTORY: Past Surgical History:  Procedure Laterality Date  . APPLICATION OF CRANIAL NAVIGATION N/A 10/15/2015   Procedure: APPLICATION OF CRANIAL NAVIGATION;  Surgeon: Erline Levine, MD;  Location: [facility] NEURO ORS;  Service: Neurosurgery;  Laterality: N/A;  . CARDIAC CATHETERIZATION  2013  . CARDIAC SURGERY     bypass X 2  . COLONOSCOPY WITH PROPOFOL N/A 05/08/2016   Procedure: COLONOSCOPY WITH PROPOFOL;  Surgeon: Jonathon Bellows, MD;  Location: [facility] ENDOSCOPY;  Service: Gastroenterology;  Laterality: N/A;  . CORONARY ARTERY BYPASS GRAFT  2013   LIMA-LAD, SVG-PDA 03/27/11 (Dr. Francee Gentile)  . CRANIOTOMY N/A 10/15/2015   Procedure: LEFT FRONTAL CRANIOTOMY TUMOR EXCISION with Curve;  Surgeon: Erline Levine, MD;  Location: [place] NEURO ORS;  Service: Neurosurgery;  Laterality: N/A;  CRANIOTOMY TUMOR EXCISION  . EYE SURGERY    . FOOT SURGERY  08/2010  . JOINT REPLACEMENT Left    partial knee  . KNEE ARTHROSCOPY  12/2008  . LYMPH NODE BIOPSY    . PACEMAKER INSERTION    . TONSILLECTOMY      I have reviewed the social history and family history with the patient and they are unchanged from previous note.  ALLERGIES:  is allergic to ezetimibe and simvastatin.  MEDICATIONS:  Current Outpatient Medications  Medication Sig Dispense Refill  . acetaminophen (TYLENOL) 500 MG tablet Take 500 mg by mouth at bedtime.    Marland Kitchen amLODipine (NORVASC) 5 MG tablet Take 1 tablet (5 mg total) by mouth daily. 90 tablet 3  . aspirin EC 81 MG tablet Take 81 mg by mouth daily.    . fluocinonide cream (LIDEX) 0.05 % Apply topically 2 (two) times daily as needed. 30 g 1  . levothyroxine (SYNTHROID, LEVOTHROID) 137 MCG tablet TAKE 1 TABLET BY MOUTH ONCE DAILY BEFORE BREAKFAST 30 tablet 1  . metoprolol succinate (TOPROL-XL) 50 MG 24 hr tablet Take 1.5 tablets  daily ('75mg'$ ). Take with or immediately following a meal. 135 tablet 3  . pentoxifylline (TRENTAL) 400 MG CR tablet Take 1 tablet (400 mg total) by mouth 2 (two) times daily. 60 tablet 6  . rosuvastatin (CRESTOR) 20 MG tablet TAKE ONE TABLET BY MOUTH AT BEDTIME 30 tablet 8  . sertraline (ZOLOFT) 100 MG tablet Take 25 mg by mouth.     . testosterone cypionate (DEPOTESTOTERONE CYPIONATE) 100 MG/ML injection Inject 1 mL (100 mg total) into the muscle every 14 (fourteen) days. 10 mL 0  . Turmeric 500 MG CAPS Take 2 capsules by mouth daily.    . vitamin E (VITAMIN E) 400 UNIT capsule Take 1 capsule (400 Units  total) by mouth 2 (two) times daily. 60 capsule 6   No current facility-administered medications for this visit.    Facility-Administered Medications Ordered in Other Visits  Medication Dose Route Frequency Provider Last Rate Last Dose  . 0.9 %  sodium chloride infusion  1,000 mL Intravenous Once [name], MD        PHYSICAL EXAMINATION:  ECOG PERFORMANCE STATUS: 2 BP 126/83 (BP Location: Left Arm, Patient Position: Sitting)   Pulse 77   Temp 97.8 F (36.6 C) (Oral)   Resp 17   Ht '5\' 9"'$  (1.753 m)   Wt 206 lb 6.4 oz (93.6 kg)   SpO2 96%   BMI 30.48 kg/m    GENERAL:alert, no distress and comfortable SKIN: Significant skin thickening below his right big toe with mild ecchymosis. Other skin color, texture, turgor are normal.  EYES: normal, right conjunctiva are pink and non-injected, sclera clear (+) left eye lid swelling with conjunctival injection  OROPHARYNX:no exudate, no erythema and lips, buccal mucosa, and tongue normal  NECK: supple, thyroid normal size, non-tender, without nodularity LYMPH:  no palpable lymphadenopathy in the cervical, , axillary or inguinal LUNGS: clear to auscultation and percussion with normal breathing effort HEART: regular rate & rhythm and no murmurs and no lower extremity edema ABDOMEN:abdomen soft, non-tender and normal bowel  sounds Musculoskeletal:no cyanosis of digits and no clubbing    LABORATORY DATA:  I have reviewed the data as listed CBC Latest Ref Rng & Units 12/03/2017 11/19/2017 11/05/2017  WBC 4.0 - 10.3 K/uL 5.5 4.2 5.5  Hemoglobin 13.0 - 17.1 g/dL 16.2 16.1 16.0  Hematocrit 38.4 - 49.9 % 47.8 48.3 47.7  Platelets 140 - 400 K/uL 158 150 135(L)     CMP Latest Ref Rng & Units 11/19/2017 11/05/2017 10/22/2017  Glucose 70 - 99 mg/dL 105(H) 141(H) 93  BUN 8 - 23 mg/dL 27(H) 35(H) 27(H)  Creatinine 0.61 - 1.24 mg/dL 1.33(H) 1.44(H) 1.27(H)  Sodium 135 - 145 mmol/L 138 141 140  Potassium 3.5 - 5.1 mmol/L 4.8 4.8 4.8  Chloride 98 - 111 mmol/L 106 109 107  CO2 22 - 32 mmol/L 20(L) 23 24  Calcium 8.9 - 10.3 mg/dL 9.0 9.3 9.3  Total Protein 6.5 - 8.1 g/dL 7.1 7.0 6.8  Total Bilirubin 0.3 - 1.2 mg/dL 0.5 0.5 0.5  Alkaline Phos 38 - 126 U/L 53 51 54  AST 15 - 41 U/L '20 22 20  '$ ALT 0 - 44 U/L '16 21 22     '$ PATHOLOGY REPORT:   Diagnosis 07/16/17  Skin , left medial knee SPONGIOTIC PSORIASIFORM DERMATITIS, SEE DESCRIPTION Microscopic Comment There is a spongiotic and psoriasiform tissue reaction involving the epidermis with an underlying mononuclear cell infiltrate in which eosinophils are not conspicuous. This pattern can be seen in a chronic eczematous reaction such as a chronic contact dermatitis, id reaction, or nummular eczema. A special stain (PAS-F) is performed to determine the presence or absence of fungal hyphae in this biopsy. The PAS stain is negative for fungal hyphae, and the controls stained appropriately. The slides were also reviewed by Dr. Andria Frames, who is in general agreement with the diagnosis.   Diagnosis 09/21/2015 Lymph node, needle/core biopsy, hypermetabolic left sub clavicular LN - METASTATIC MALIGNANT MELANOMA. - SEE COMMENT.  Diagnosis 10/15/2015 Brain, for tumor resection, Left frontal - METASTATIC MELANOMA.     PROCEDURES: Colonoscopy 05/08/16 - Stool in the ascending  colon. Fluid aspiration performed. - Diverticulosis in the entire examined colon. - Non-bleeding internal hemorrhoids. -  The examination was otherwise normal on direct and retroflexion views.  RADIOGRAPHIC STUDIES: I have personally reviewed the radiological images as listed and agreed with the findings in the report. No results found.    PET 10/10/17 IMPRESSION: 1. Small focus of FDG accumulation in the subcutaneous fat of the right gluteal region, nonspecific. Attention on follow-up suggested. 2. Otherwise stable exam without new or progressive hypermetabolic disease in the interval since prior PET-CT. 3. Stable 8 mm nodule in the lingula. No evidence for hypermetabolism on PET imaging. 4.  Aortic Atherosclerois (ICD10-170.0)    CT Head with and without contrast 11/27/2016 IMPRESSION: 1. Continued evolution of post radiation changes in the left frontal lobe with further decrease in the area of contrast enhancement and adjacent edema. 2. No new contrast-enhancing lesions.  PET 10/18/16 IMPRESSION: 1. New focal hypermetabolism in the proximal sigmoid colon with associated focal colonic wall thickening and mild pericolonic fat stranding at the site of a proximal sigmoid diverticulum, favored to represent mild acute sigmoid diverticulitis. Metastatic disease is considered less likely. Clinical correlation is necessary. Consider appropriate antibiotic therapy, as clinically warranted. Attention to this region recommended on future PET-CT follow-up. 2. Otherwise complete metabolic response with no evidence of hypermetabolic metastatic disease. Lingular pulmonary nodule is slightly decreased in size and demonstrates no significant metabolism. 3. No residual hypermetabolism in the right thyroid lobe nodule, which is stable in size. 4. Aortic Atherosclerosis (ICD10-I70.0). Stable 3.1 cm infrarenal abdominal aortic aneurysm. Aortic aneurysm NOS (ICD10-I71.9). Recommend followup by  ultrasound in 3 years. This recommendation follows ACR consensus guidelines: White Paper of the ACR Incidental Findings Committee II on Vascular Findings. J Am Coll Radiol 2013;   CT Head W and WO Contrast 10/05/16 IMPRESSION: 1. Decreased size of the enhancing area within the left frontal lobe treatment site. Surrounding edema has also decreased. The findings support continued evolution of radiation necrosis. 2. No new enhancing lesions.  CT Head 09/02/16 IMPRESSION: Somewhat mixed pattern of slight posterior progression of pleomorphic enhancement surrounding the LEFT lateral ventricle although no significant mass effect, and reduced vasogenic edema compared with the scan in April.  CT Head W WO Contrast 07-29-2016 IMPRESSION: Pronounced change in the left frontal region. Much more regional vasogenic edema. Relatively stable appearance of the abnormal enhancement extending from the lateral margin of the frontal horn of left lateral ventricle to the frontal brain surface. New region of abnormal enhancement surrounding the frontal horn of the left lateral ventricle measuring approximately 3.2 cm. The differential diagnosis is recurrent tumor versus radiation necrosis. The patient is reportedly not an MRI candidate. This area is large enough that CT perfusion could possibly make a reasonable differentiation.  PET Image Restage 05/29/16 IMPRESSION: 1. Stable exam with no evidence progression. 2. Photopenia in the LEFT frontal lobe at prior surgical site. 3. Stable mildly hypermetabolic RIGHT paratracheal node. This may represent a thyroid nodule. 4. Stable size of minimally metabolic lingular pulmonary nodule. 5. Stable skeletal lesion of the RIGHT iliac crest 6. No cutaneous metabolic lesion.   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   1.  Metastatic melanoma to lung, node, bone and brain, stage IV, BRAF V600K mutation (+) -I previously reviewed his PET/CT scan images with pt and his family members. The scan revealed hypermetabolic left upper lobe lung mass, 2 small right lung nodules, probable bone metastasis, and a subcutaneous left supraclavicular lesion, which  is not palpable  -His repeat CT had showed a 2.0 cm left frontal hyperdense lesion with enhancement on contrast, in the area of his recent hemorrhagic stroke, highly suspicious for brain metastasis.  -We previously discussed his left supraclavicular node biopsy results, which showed metastatic melanoma.. -His case was discussed in our sarcoma tumor board. -I previously recommended a first-line treatment with Nivolamab alone, given his recent stroke, low tumor burden. I'll reserve BRAF inhibitor with or without MEK inhibitor as a second line therapy if he progress on immunotherapy. Other options of immunotherapy, especially checkpoint inhibitor with ipilumimab, were also discussed with pt and his daughter -He is on first line therapy with Nivolumab, has been tolerating Nivolumab well, no significant side effects except fatigue. It was held from 09/06/16 to 12/18/2016 due to his chronic steroids use. -He has had near complete metabolic response on the recent restaging PET scan in July 2018. 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 -PET can 12/13/16 shows no evidence of disease. He has achieved a radiographic complete response.  -He has restarted Nivolamab on 12/18/16 and changed to every 4 weeks on 01/30/17. I plan to continue Nivolumab for total of 2 years if he remains to be NED on scan -He finish tapering steroids on 10/32/18, he has  tolerated this well.   -PET scan on 04/18/2017 again showed complete radiographic metabolic response with only a residual 67m left lung nodule without any FDG uptake. Discussed results with pt and he is pleased  -Due to his fatigue after treatment, will continue his Nivolumab every 2 weeks in 04/2017 -We previously discussed his 10/10/17 PET which shows stable with no disease progression. There is incidental finding of soft tissue uptake in the right gluteal region. Pt notes recent body ache in that location, but exam that area showed no skin change, palpable mass, tenderness or other significant findings. Will monitor. -I previously discussed he continues to have good response to treatment. Will continue Nivolumab and plan to stop in 12/2017 to complete 2 years of treatment.  -I again encouraged him to continue exercising regularly.  -He is clinically doing well, no other symptoms, except fatigue, memory loss and cognitive dysfunction, able to do self-care at home. Physical exam unremarkable.  There is no clinical concern for disease progression -Labs reviewed, CBC overall WNL and CKD is stable. Overall adequate to proceed with Nivolumab today, and to continue every 2 weeks for 2 more doses. -plan to repeat staging PET scan in October -F/u in 2 weeks   2. Skin rash- Pruritic maculopapular rash to back, arms, hands; scaly plaque rash to legs -Skin rash on left medial leg was biopsied, path revealed spongiotic psoriasiform dermatitis, no evidence of fungal infection. Stains suggest chronic eczematous reaction such as a chronic contact dermatitis, ID reaction, or nummular eczema.  -I believe this is related to Nivolumab. The rash improved dramatically with lidex cream. -I previously recommended he continue lidex; I reviewed risk of possible flare in the future -Rash overall resolved, he no longer uses cream. If his rash progresses or returns I encouraged him to restart Lidex again.    3. History of  hemorrhagic stroke and brain metastasis  -I previously encouraged him to continue physical therapy and occupational therapy. 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 However his clinical neurological function has deteriorated -I have previously discussed with our neuro-oncologist Dr. 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 Mickeal Skinner, his radionecrosis has improved, no other new lesions. He was also seen by Dr. Mickeal Skinner who feels he is recovering well from neurological standpoint -I tapered his steroids until he stopped altogether by 01/29/17 -He will continue to f/u with Dr. Mickeal Skinner.  4. 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 Mickeal Skinner to discuss treatment options to improve his neurologic function, patient is very frustrated about this, which significantly impacting his quality of life. -continue PT/OT  -CT Head 11/27/2016 showed continued evolution of post radiation changes in the left frontal lobe with further decrease in the area of contrast enhancement and adjacent edema. No new contrast-enhancing lesions.  -He will continue to follow up with Dr. Mickeal Skinner.   5.  Hypercalcemia and CKD stage III  -Possibly related to his underlying malignancy (bone mets) and missing one dose of Xgeva.  He has restarted Xgeva monthly on 11/10/16.   -I previously advised him to eat less cheese and cut out milk and dairy products as possible -I previously encouraged him to drink water adequately and avoid NSAIDs -CMP was pending   6. Fatigue, hypothyroidism and hypotestosteronemia -He has baseline hypothyroidism and hypotestosteronemia -His fatigue has much improved since he restarted testosterone injection by his primary care physician -His TSH level have been slightly fluctuating, possibly partially related to Nivolumab -He will follow-up with his primary care physician for his Synthroid dose adjustment    7. HTN   -he is on amlodipine and carvedilol, follow-up of his primary care physician -Due to drug interaction with new oral chemo he will need to monitor his BP more often.  -Systolic BP had previously increased, so he was started on low dose Coreg. He has been dizzy lately.  -I previously advised him to continue to keep a BP log at home -better controlled lately BP at 126/83 today    8. Depression  -He is on Zoloft '100mg'$  daily, management by his PCP  -Due to his recent insomnia, he has reduced his Zoloft to 25 mg daily by himself, he reports to be less motivated to do physical therapy after the change, I encouraged him to increase Zoloft to 50 mg daily, and consider melatonin at night for insomnia.   9. Goal of care discussion  -We previously discussed the incurable nature of his metastatic melanoma, and the overall poor prognosis, especially if he does not have good response to chemotherapy or progress on chemo -The patient understands the goal of care is palliative. -He is full code, recent PET showed NED   10. CAD -history of 2 double vessel bypass -He has pace maker -per pt (his daughter) request, he has switched his cardiologist to Dr. Haroldine Laws  Plan -I advised him to increase zoloft dose to 50 mg daily and take in the morning. He recently reduced dose due to insomnia    -Increase melatonin to 10 mg for insomnia  -Labs reviewed, will proceed Nivolumab today and continue every 2 weeks for 2 more doses  -f/u in 2 weeks    All questions were answered. The patient knows to call the clinic with any problems, questions or concerns. No barriers to learning was detected.  I spent 20 minutes counseling the patient face to face. The total time spent in the appointment was 25 minutes and more than 50% was on counseling and review of test results.  Oneal Deputy, am acting as scribe for [name], MD.   I have reviewed the above documentation for accuracy and completeness, and I agree with the above.      [name], MD 12/03/17

## 2017-12-03 NOTE — Patient Instructions (Addendum)
[facility] Discharge Instructions for Patients Receiving Chemotherapy  Today you received the following chemotherapy agents: Nivolumab  To help prevent nausea and vomiting after your treatment, we encourage you to take your nausea medication as directed.    If you develop nausea and vomiting that is not controlled by your nausea medication, call the clinic.   BELOW ARE SYMPTOMS THAT SHOULD BE REPORTED IMMEDIATELY:  *FEVER GREATER THAN 100.5 F  *CHILLS WITH OR WITHOUT FEVER  NAUSEA AND VOMITING THAT IS NOT CONTROLLED WITH YOUR NAUSEA MEDICATION  *UNUSUAL SHORTNESS OF BREATH  *UNUSUAL BRUISING OR BLEEDING  TENDERNESS IN MOUTH AND THROAT WITH OR WITHOUT PRESENCE OF ULCERS  *URINARY PROBLEMS  *BOWEL PROBLEMS  UNUSUAL RASH Items with * indicate a potential emergency and should be followed up as soon as possible.  Feel free to call the clinic should you have any questions or concerns. The clinic phone number is [phone].  Please show the CHEMO ALERT CARD at check-in to the Emergency Department and triage nurse.   

## 2017-12-04 ENCOUNTER — Telehealth: Payer: Self-pay | Admitting: Hematology

## 2017-12-04 ENCOUNTER — Encounter: Payer: Self-pay | Admitting: Hematology

## 2017-12-04 DIAGNOSIS — M7501 Adhesive capsulitis of right shoulder: Secondary | ICD-10-CM | POA: Diagnosis not present

## 2017-12-04 DIAGNOSIS — M25511 Pain in right shoulder: Secondary | ICD-10-CM | POA: Diagnosis not present

## 2017-12-04 NOTE — Telephone Encounter (Signed)
No LOS 8/26

## 2017-12-06 DIAGNOSIS — M7501 Adhesive capsulitis of right shoulder: Secondary | ICD-10-CM | POA: Diagnosis not present

## 2017-12-06 DIAGNOSIS — M25511 Pain in right shoulder: Secondary | ICD-10-CM | POA: Diagnosis not present

## 2017-12-11 DIAGNOSIS — M25511 Pain in right shoulder: Secondary | ICD-10-CM | POA: Diagnosis not present

## 2017-12-11 DIAGNOSIS — M7501 Adhesive capsulitis of right shoulder: Secondary | ICD-10-CM | POA: Diagnosis not present

## 2017-12-13 ENCOUNTER — Telehealth: Payer: Self-pay | Admitting: Hematology

## 2017-12-13 DIAGNOSIS — M7501 Adhesive capsulitis of right shoulder: Secondary | ICD-10-CM | POA: Diagnosis not present

## 2017-12-13 DIAGNOSIS — M25511 Pain in right shoulder: Secondary | ICD-10-CM | POA: Diagnosis not present

## 2017-12-13 NOTE — Telephone Encounter (Signed)
Called regarding 9/9 and 9/23

## 2017-12-17 ENCOUNTER — Inpatient Hospital Stay: Payer: Medicare Other

## 2017-12-17 ENCOUNTER — Inpatient Hospital Stay: Payer: Medicare Other | Attending: Hematology

## 2017-12-17 ENCOUNTER — Inpatient Hospital Stay: Payer: Medicare Other | Admitting: Hematology

## 2017-12-17 ENCOUNTER — Other Ambulatory Visit: Payer: Medicare Other

## 2017-12-17 VITALS — BP 133/88 | HR 75 | Temp 97.8°F | Resp 17

## 2017-12-17 DIAGNOSIS — C7951 Secondary malignant neoplasm of bone: Secondary | ICD-10-CM | POA: Diagnosis not present

## 2017-12-17 DIAGNOSIS — Z5112 Encounter for antineoplastic immunotherapy: Secondary | ICD-10-CM | POA: Diagnosis not present

## 2017-12-17 DIAGNOSIS — Z79899 Other long term (current) drug therapy: Secondary | ICD-10-CM | POA: Insufficient documentation

## 2017-12-17 DIAGNOSIS — C799 Secondary malignant neoplasm of unspecified site: Secondary | ICD-10-CM

## 2017-12-17 DIAGNOSIS — I10 Essential (primary) hypertension: Secondary | ICD-10-CM

## 2017-12-17 DIAGNOSIS — C439 Malignant melanoma of skin, unspecified: Secondary | ICD-10-CM | POA: Diagnosis not present

## 2017-12-17 DIAGNOSIS — I619 Nontraumatic intracerebral hemorrhage, unspecified: Secondary | ICD-10-CM

## 2017-12-17 DIAGNOSIS — Z23 Encounter for immunization: Secondary | ICD-10-CM | POA: Insufficient documentation

## 2017-12-17 DIAGNOSIS — C770 Secondary and unspecified malignant neoplasm of lymph nodes of head, face and neck: Secondary | ICD-10-CM | POA: Insufficient documentation

## 2017-12-17 DIAGNOSIS — C7800 Secondary malignant neoplasm of unspecified lung: Secondary | ICD-10-CM | POA: Insufficient documentation

## 2017-12-17 DIAGNOSIS — C7931 Secondary malignant neoplasm of brain: Secondary | ICD-10-CM | POA: Diagnosis not present

## 2017-12-17 LAB — COMPREHENSIVE METABOLIC PANEL
ALK PHOS: 59 U/L (ref 38–126)
ALT: 25 U/L (ref 0–44)
ANION GAP: 9 (ref 5–15)
AST: 27 U/L (ref 15–41)
Albumin: 3.7 g/dL (ref 3.5–5.0)
BUN: 21 mg/dL (ref 8–23)
CHLORIDE: 106 mmol/L (ref 98–111)
CO2: 24 mmol/L (ref 22–32)
Calcium: 9.6 mg/dL (ref 8.9–10.3)
Creatinine, Ser: 1.31 mg/dL — ABNORMAL HIGH (ref 0.61–1.24)
GFR calc Af Amer: 58 mL/min — ABNORMAL LOW (ref >60)
GFR calc non Af Amer: 50 mL/min — ABNORMAL LOW (ref >60)
GLUCOSE: 137 mg/dL — AB (ref 70–99)
Potassium: 5 mmol/L (ref 3.5–5.1)
Sodium: 139 mmol/L (ref 135–145)
Total Bilirubin: 0.5 mg/dL (ref 0.3–1.2)
Total Protein: 7 g/dL (ref 6.5–8.1)

## 2017-12-17 LAB — CBC WITH DIFFERENTIAL/PLATELET
BASOS ABS: 0 10*3/uL (ref 0.0–0.1)
Basophils Relative: 1 %
EOS PCT: 7 %
Eosinophils Absolute: 0.3 10*3/uL (ref 0.0–0.5)
HCT: 46.9 % (ref 38.4–49.9)
HEMOGLOBIN: 15.8 g/dL (ref 13.0–17.1)
LYMPHS ABS: 0.4 10*3/uL — AB (ref 0.9–3.3)
Lymphocytes Relative: 9 %
MCH: 30.9 pg (ref 27.2–33.4)
MCHC: 33.7 g/dL (ref 32.0–36.0)
MCV: 91.7 fL (ref 79.3–98.0)
MONO ABS: 0.4 10*3/uL (ref 0.1–0.9)
Monocytes Relative: 8 %
NEUTROS ABS: 3.4 10*3/uL (ref 1.5–6.5)
Neutrophils Relative %: 75 %
Platelets: 175 10*3/uL (ref 140–400)
RBC: 5.11 MIL/uL (ref 4.20–5.82)
RDW: 13.8 % (ref 11.0–14.6)
WBC: 4.5 10*3/uL (ref 4.0–10.3)

## 2017-12-17 LAB — TSH: TSH: 3.448 u[IU]/mL (ref 0.320–4.118)

## 2017-12-17 MED ORDER — DENOSUMAB 120 MG/1.7ML ~~LOC~~ SOLN
SUBCUTANEOUS | Status: AC
  Filled 2017-12-17: qty 1.7

## 2017-12-17 MED ORDER — DENOSUMAB 120 MG/1.7ML ~~LOC~~ SOLN
120.0000 mg | Freq: Once | SUBCUTANEOUS | Status: AC
  Administered 2017-12-17: 120 mg via SUBCUTANEOUS

## 2017-12-17 MED ORDER — SODIUM CHLORIDE 0.9 % IV SOLN
Freq: Once | INTRAVENOUS | Status: AC
  Administered 2017-12-17: 12:00:00 via INTRAVENOUS
  Filled 2017-12-17: qty 250

## 2017-12-17 MED ORDER — SODIUM CHLORIDE 0.9 % IV SOLN
240.0000 mg | Freq: Once | INTRAVENOUS | Status: AC
  Administered 2017-12-17: 240 mg via INTRAVENOUS
  Filled 2017-12-17: qty 24

## 2017-12-17 NOTE — Patient Instructions (Signed)
[facility] Discharge Instructions for Patients Receiving Chemotherapy  Today you received the following chemotherapy agents Opdivo  To help prevent nausea and vomiting after your treatment, we encourage you to take your nausea medication as directed   If you develop nausea and vomiting that is not controlled by your nausea medication, call the clinic.   BELOW ARE SYMPTOMS THAT SHOULD BE REPORTED IMMEDIATELY:  *FEVER GREATER THAN 100.5 F  *CHILLS WITH OR WITHOUT FEVER  NAUSEA AND VOMITING THAT IS NOT CONTROLLED WITH YOUR NAUSEA MEDICATION  *UNUSUAL SHORTNESS OF BREATH  *UNUSUAL BRUISING OR BLEEDING  TENDERNESS IN MOUTH AND THROAT WITH OR WITHOUT PRESENCE OF ULCERS  *URINARY PROBLEMS  *BOWEL PROBLEMS  UNUSUAL RASH Items with * indicate a potential emergency and should be followed up as soon as possible.  Feel free to call the clinic should you have any questions or concerns. The clinic phone number is [phone].  Please show the CHEMO ALERT CARD at check-in to the Emergency Department and triage nurse.   

## 2017-12-19 DIAGNOSIS — M25511 Pain in right shoulder: Secondary | ICD-10-CM | POA: Diagnosis not present

## 2017-12-19 DIAGNOSIS — M7501 Adhesive capsulitis of right shoulder: Secondary | ICD-10-CM | POA: Diagnosis not present

## 2017-12-20 ENCOUNTER — Other Ambulatory Visit: Payer: Self-pay | Admitting: Family Medicine

## 2017-12-20 DIAGNOSIS — E349 Endocrine disorder, unspecified: Secondary | ICD-10-CM

## 2017-12-20 DIAGNOSIS — N183 Chronic kidney disease, stage 3 unspecified: Secondary | ICD-10-CM

## 2017-12-20 DIAGNOSIS — E039 Hypothyroidism, unspecified: Secondary | ICD-10-CM

## 2017-12-20 DIAGNOSIS — R7303 Prediabetes: Secondary | ICD-10-CM

## 2017-12-20 DIAGNOSIS — E782 Mixed hyperlipidemia: Secondary | ICD-10-CM

## 2017-12-20 DIAGNOSIS — R972 Elevated prostate specific antigen [PSA]: Secondary | ICD-10-CM

## 2017-12-21 ENCOUNTER — Ambulatory Visit (INDEPENDENT_AMBULATORY_CARE_PROVIDER_SITE_OTHER): Payer: Medicare Other

## 2017-12-21 VITALS — BP 110/82 | HR 76 | Temp 98.5°F | Ht 69.5 in | Wt 202.0 lb

## 2017-12-21 DIAGNOSIS — R972 Elevated prostate specific antigen [PSA]: Secondary | ICD-10-CM

## 2017-12-21 DIAGNOSIS — M25511 Pain in right shoulder: Secondary | ICD-10-CM | POA: Diagnosis not present

## 2017-12-21 DIAGNOSIS — N183 Chronic kidney disease, stage 3 unspecified: Secondary | ICD-10-CM

## 2017-12-21 DIAGNOSIS — M7501 Adhesive capsulitis of right shoulder: Secondary | ICD-10-CM | POA: Diagnosis not present

## 2017-12-21 DIAGNOSIS — Z0000 Encounter for general adult medical examination without abnormal findings: Secondary | ICD-10-CM

## 2017-12-21 DIAGNOSIS — R7303 Prediabetes: Secondary | ICD-10-CM | POA: Diagnosis not present

## 2017-12-21 DIAGNOSIS — E349 Endocrine disorder, unspecified: Secondary | ICD-10-CM

## 2017-12-21 DIAGNOSIS — E782 Mixed hyperlipidemia: Secondary | ICD-10-CM | POA: Diagnosis not present

## 2017-12-21 LAB — LIPID PANEL
CHOL/HDL RATIO: 4
Cholesterol: 164 mg/dL (ref 0–200)
HDL: 39.8 mg/dL (ref >39.00)
NONHDL: 124.56
Triglycerides: 227 mg/dL — ABNORMAL HIGH (ref 0.0–149.0)
VLDL: 45.4 mg/dL — AB (ref 0.0–40.0)

## 2017-12-21 LAB — VITAMIN D 25 HYDROXY (VIT D DEFICIENCY, FRACTURES): VITD: 22.93 ng/mL — ABNORMAL LOW (ref 30.00–100.00)

## 2017-12-21 LAB — HEMOGLOBIN A1C: Hgb A1c MFr Bld: 6.3 % (ref 4.6–6.5)

## 2017-12-21 LAB — LDL CHOLESTEROL, DIRECT: LDL DIRECT: 95 mg/dL

## 2017-12-21 LAB — PSA: PSA: 5.6 ng/mL — ABNORMAL HIGH (ref 0.10–4.00)

## 2017-12-21 NOTE — Progress Notes (Signed)
PCP notes:   Health maintenance:  No gaps identified.  Abnormal screenings:   Mini-Cog score: 19/20 MMSE - Mini Mental State Exam 12/21/2017  Orientation to time 5  Orientation to Place 5  Registration 3  Attention/ Calculation 0  Recall 2  Recall-comments unable to recall 1 of 3 words  Language- name 2 objects 0  Language- repeat 1  Language- follow 3 step command 3  Language- read & follow direction 0  Write a sentence 0  Copy design 0  Total score 19     Patient concerns:   None  Nurse concerns:  None  Next PCP appt:   12/24/17 @ 0830

## 2017-12-21 NOTE — Progress Notes (Signed)
Subjective:   David [name]. is a 77 y.o. male who presents for Medicare Annual/Subsequent preventive examination.  Review of Systems:  N/A Cardiac Risk Factors include: advanced age (>74mn, >>27women);male gender;dyslipidemia;hypertension     Objective:    Vitals: BP 110/82 (BP Location: Right Arm, Patient Position: Sitting, Cuff Size: Normal)   Pulse 76   Temp 98.5 F (36.9 C) (Oral)   Ht 5' 9.5" (1.765 m) Comment: shoes  Wt 202 lb (91.6 kg)   SpO2 96%   BMI 29.40 kg/m   Body mass index is 29.4 kg/m.  Advanced Directives 07/30/2017 11/27/2016 11/10/2016 11/01/2016 11/01/2016 10/19/2016 10/16/2016  Does Patient Have a Medical Advance Directive? _0  Yes Yes  Type of [place]amed[place] - [place] - - Living will;Healthcare Power of Attorney  Does patient want to make changes to medical advance directive? - - - No - Patient declined - - -  Copy of [place] Chart? No - copy requested - No - copy requested No - copy requested - - No - copy requested    Tobacco Social History   Tobacco Use  Smoking Status Former Smoker  . Packs/day: 1.00  . Years: 3.00  . Pack years: 3.00  . Last attempt to quit: 04/10/1957  . Years since quitting: 60.7  Smokeless Tobacco Never Used     Counseling given: No   Clinical Intake:  Pre-visit preparation completed: Yes  Pain : No/denies pain Pain Score: 0-No pain     Nutritional Status: BMI 25 -29 Overweight Nutritional Risks: None Diabetes: No  How often do you need to have someone help you when you read instructions, pamphlets, or other written materials from your doctor or pharmacy?: 1 - Never What is the last grade level you completed in school?: DDS  Interpreter Needed?: No  Comments: pt lives with spouse Information entered by :: LPinson, LPN  Past Medical History:    Diagnosis Date  . Anginal pain ([place]   . Arthritis    mostly hands  . Benign familial hematuria   . BPH (benign prostatic hypertrophy)   . Brain cancer ([place]    melanoma with brain met  . Coronary artery disease   . GERD (gastroesophageal reflux disease)   . High cholesterol   . Hypertension   . Hypothyroidism   . Intracerebral hemorrhage (HL[place]   . Melanoma (HH[place]   . Metastatic melanoma to lung ([place]   . Mood disorder (HGood [place]   . Myocardial infarction (HF[place]ers[place]   . Pacemaker    due to syncope, 3rd degree HB ([facility] CRT-P 02/25/13 (Dr. BUvaldo Rising  . Rectal bleed    due to NSAIDS  . Renal disorder   . Skin cancer    melanoma  . Sleep apnea    uses CPAP  . Stroke ([facility]    Past Surgical History:  Procedure Laterality Date  . APPLICATION OF CRANIAL NAVIGATION N/A 10/15/2015   Procedure: APPLICATION OF CRANIAL NAVIGATION;  Surgeon: JErline Levine MD;  [facility];  Service: Neurosurgery;  Laterality: N/A;  . CARDIAC CATHETERIZATION  2013  . CARDIAC SURGERY     bypass X 2  . COLONOSCOPY WITH PROPOFOL N/A 05/08/2016   Procedure: COLONOSCOPY WITH PROPOFOL;  Surgeon: KJonathon Bellows MD;  [facility];  Service: Gastroenterology;  Laterality: N/A;  . CORONARY ARTERY BYPASS GRAFT  2013  LIMA-LAD, SVG-PDA 03/27/11 (Dr. Francee Gentile)  . CRANIOTOMY N/A 10/15/2015   Procedure: LEFT FRONTAL CRANIOTOMY TUMOR EXCISION with Curve;  Surgeon: Erline Levine, MD;  Location: [place] [facility];  Service: Neurosurgery;  Laterality: N/A;  CRANIOTOMY TUMOR EXCISION  . EYE SURGERY    . FOOT SURGERY  08/2010  . JOINT REPLACEMENT Left    partial knee  . KNEE ARTHROSCOPY  12/2008  . LYMPH NODE BIOPSY    . PACEMAKER INSERTION    . TONSILLECTOMY     Family History  Problem Relation Age of Onset  . Cancer Mother 84       lung   . Hypertension Mother   . Hypertension Father   . Heart attack Father   . Heart disease Father   . Rheum arthritis Sister   . Cancer Maternal Grandmother     Social History   Socioeconomic History  . Marital status: Married    Spouse name: Not on file  . Number of children: 2  . Years of education: Not on file  . Highest education level: Not on file  Occupational History  . Occupation: retired Pharmacist, community  Social Needs  . Financial resource strain: Not on file  . Food insecurity:    Worry: Not on file    Inability: Not on file  . Transportation needs:    Medical: Not on file    Non-medical: Not on file  Tobacco Use  . Smoking status: Former Smoker    Packs/day: 1.00    Years: 3.00    Pack years: 3.00    Last attempt to quit: 04/10/1957    Years since quitting: 60.7  . Smokeless tobacco: Never Used  Substance and Sexual Activity  . Alcohol use: Yes    Alcohol/week: 14.0 standard drinks    Types: 10 Glasses of wine, 4 Cans of beer per week  . Drug use: No  . Sexual activity: Not Currently  Lifestyle  . Physical activity:    Days per week: Not on file    Minutes per session: Not on file  . Stress: Not on file  Relationships  . Social connections:    Talks on phone: Not on file    Gets together: Not on file    Attends religious service: Not on file    Active member of club or organization: Not on file    Attends meetings of clubs or organizations: Not on file    Relationship status: Not on file  Other Topics Concern  . Not on file  Social History Narrative   Lives with wife David [name]) with dementia in [facility]   David [name] and son   Occ: retired Pharmacist, community practiced in [place]   Has living will   David [name] is health care [place]   Requests DNR-- done 09/14/15   Not sure about tube feeds    Outpatient Encounter Medications as of 12/21/2017  Medication Sig  . acetaminophen (TYLENOL) 500 MG tablet Take 500 mg by mouth at bedtime.  Marland Kitchen amLODipine (NORVASC) 5 MG tablet Take 1 tablet (5 mg total) by mouth daily.  Marland Kitchen aspirin EC 81 MG tablet Take 81 mg by mouth daily.  . fluocinonide cream (LIDEX) 0.05 % Apply  topically 2 (two) times daily as needed.  Marland Kitchen levothyroxine (SYNTHROID, LEVOTHROID) 137 MCG tablet TAKE 1 TABLET BY MOUTH ONCE DAILY BEFORE BREAKFAST  . metoprolol succinate (TOPROL-XL) 50 MG 24 hr tablet Take 1.5 tablets daily (91m). Take with or immediately following a  meal.  . pentoxifylline (TRENTAL) 400 MG CR tablet Take 1 tablet (400 mg total) by mouth 2 (two) times daily.  . rosuvastatin (CRESTOR) 20 MG tablet TAKE ONE TABLET BY MOUTH AT BEDTIME  . sertraline (ZOLOFT) 100 MG tablet Take 25 mg by mouth.   . testosterone cypionate (DEPOTESTOTERONE CYPIONATE) 100 MG/ML injection Inject 1 mL (100 mg total) into the muscle every 14 (fourteen) days.  . Turmeric 500 MG CAPS Take 2 capsules by mouth daily.  . vitamin E (VITAMIN E) 400 UNIT capsule Take 1 capsule (400 Units total) by mouth 2 (two) times daily.   Facility-Administered Encounter Medications as of 12/21/2017  Medication  . 0.9 %  sodium chloride infusion    Activities of Daily Living In your present state of health, do you have any difficulty performing the following activities: 12/21/2017  Hearing? Y  Vision? N  Difficulty concentrating or making decisions? Y  Walking or climbing stairs? Y  Dressing or bathing? N  Doing errands, shopping? Y  Preparing Food and eating ? N  Using the Toilet? N  In the past six months, have you accidently leaked urine? N  Do you have problems with loss of bowel control? Y  Managing your Medications? N  Managing your Finances? N  Housekeeping or managing your Housekeeping? N  Some recent data might be hidden    Patient Care Team: [name], MD as PCP - General (Family Medicine)   Assessment:   This is a routine wellness examination for [place].   Hearing Screening   125Hz 250Hz 500Hz 1000Hz 2000Hz 3000Hz 4000Hz 6000Hz 8000Hz  Right ear:   40 40 40  40    Left ear:   40 40 40  0    Vision Screening Comments: Vision exam in September 2018     Exercise Activities and Dietary  recommendations Current Exercise Habits: Structured exercise class, Type of exercise: stretching;strength training/weights, Time (Minutes): 20, Frequency (Times/Week): 2, Weekly Exercise (Minutes/Week): 40, Intensity: Mild, Exercise limited by: None identified  Goals    . Patient Stated     Starting 12/21/2017, I will continue to take medications as prescribed.        Fall Risk Fall Risk  12/21/2017 10/16/2016 07/24/2016 06/27/2016 04/24/2016  Falls in the past year? _0   Risk for fall due to : - Impaired mobility;Impaired balance/gait - - -   Depression Screen PHQ 2/9 Scores 12/21/2017 10/16/2016 07/24/2016 01/19/2016  PHQ - 2 Score 1 0 1 0  PHQ- 9 Score 3 - - -    Cognitive Function MMSE - Mini Mental State Exam 12/21/2017  Orientation to time 5  Orientation to Place 5  Registration 3  Attention/ Calculation 0  Recall 2  Recall-comments unable to recall 1 of 3 words  Language- name 2 objects 0  Language- repeat 1  Language- follow 3 step command 3  Language- read & follow direction 0  Write a sentence 0  Copy design 0  Total score 19     PLEASE NOTE: A Mini-Cog screen was completed. Maximum score is 20. A value of 0 denotes this part of Folstein MMSE was not completed or the patient failed this part of the Mini-Cog screening.   Mini-Cog Screening Orientation to Time - Max 5 pts Orientation to Place - Max 5 pts Registration - Max 3 pts Recall - Max 3 pts Language Repeat - Max 1 pts Language Follow 3 Step Command - Max 3 pts  Immunization History  Administered Date(s) Administered  . DTaP 08/07/2012  . Influenza, High Dose Seasonal PF 01/30/2017  . Pneumococcal Conjugate-13 10/28/2013, 05/10/2016  . Pneumococcal Polysaccharide-23 08/07/2012   Screening Tests Health Maintenance  Topic Date Due  . TETANUS/TDAP  08/08/2022 (Originally 06/28/1959)  . DTaP/Tdap/Td (2 - Tdap) 08/08/2022  . PNA vac Low Risk Adult  Completed    Plan:   I have personally  reviewed, addressed, and noted the following in the patient's chart:  A. Medical and social history B. Use of alcohol, tobacco or illicit drugs  C. Current medications and supplements D. Functional ability and status E.  Nutritional status F.  Physical activity G. Advance directives H. List of other physicians I.  Hospitalizations, surgeries, and ER visits in previous 12 months J.  [place] to include hearing, vision, cognitive, depression L. Referrals and appointments - none  In addition, I have reviewed and discussed with patient certain preventive protocols, quality metrics, and best practice recommendations. A written personalized care plan for preventive services as well as general preventive health recommendations were provided to patient.  See attached scanned questionnaire for additional information.   Signed,   Lindell Noe, MHA, BS, LPN Health Coach

## 2017-12-21 NOTE — Patient Instructions (Signed)
Mr. Docter , Thank you for taking time to come for your Medicare Wellness Visit. I appreciate your ongoing commitment to your health goals. Please review the following plan we discussed and let me know if I can assist you in the future.   These are the goals we discussed: Goals    . Patient Stated     Starting 12/21/2017, I will continue to take medications as prescribed.        This is a list of the screening recommended for you and due dates:  Health Maintenance  Topic Date Due  . Tetanus Vaccine  08/08/2022*  . DTaP/Tdap/Td vaccine (2 - Tdap) 08/08/2022  . Pneumonia vaccines  Completed  *Topic was postponed. The date shown is not the original due date.   Preventive Care for Adults  A healthy lifestyle and preventive care can promote health and wellness. Preventive health guidelines for adults include the following key practices.  . A routine yearly physical is a good way to check with your health care provider about your health and preventive screening. It is a chance to share any concerns and updates on your health and to receive a thorough exam.  . Visit your dentist for a routine exam and preventive care every 6 months. Brush your teeth twice a day and floss once a day. Good oral hygiene prevents tooth decay and gum disease.  . The frequency of eye exams is based on your age, health, family medical history, use  of contact lenses, and other factors. Follow your health care provider's recommendations for frequency of eye exams.  . Eat a healthy diet. Foods like vegetables, fruits, whole grains, low-fat dairy products, and lean protein foods contain the nutrients you need without too many calories. Decrease your intake of foods high in solid fats, added sugars, and salt. Eat the right amount of calories for you. Get information about a proper diet from your health care provider, if necessary.  . Regular physical exercise is one of the most important things you can do for your  health. Most adults should get at least 150 minutes of moderate-intensity exercise (any activity that increases your heart rate and causes you to sweat) each week. In addition, most adults need muscle-strengthening exercises on 2 or more days a week.  Silver Sneakers may be a benefit available to you. To determine eligibility, you may visit the website: www.silversneakers.com or contact program at 801-094-3042 Mon-Fri between 8AM-8PM.   . Maintain a healthy weight. The body mass index (BMI) is a screening tool to identify possible weight problems. It provides an estimate of body fat based on height and weight. Your health care provider can find your BMI and can help you achieve or maintain a healthy weight.   For adults 20 years and older: ? A BMI below 18.5 is considered underweight. ? A BMI of 18.5 to 24.9 is normal. ? A BMI of 25 to 29.9 is considered overweight. ? A BMI of 30 and above is considered obese.   . Maintain normal blood lipids and cholesterol levels by exercising and minimizing your intake of saturated fat. Eat a balanced diet with plenty of fruit and vegetables. Blood tests for lipids and cholesterol should begin at age 70 and be repeated every 5 years. If your lipid or cholesterol levels are high, you are over 50, or you are at high risk for heart disease, you may need your cholesterol levels checked more frequently. Ongoing high lipid and cholesterol levels should be treated  with medicines if diet and exercise are not working.  . If you smoke, find out from your health care provider how to quit. If you do not use tobacco, please do not start.  . If you choose to drink alcohol, please do not consume more than 2 drinks per day. One drink is considered to be 12 ounces (355 mL) of beer, 5 ounces (148 mL) of wine, or 1.5 ounces (44 mL) of liquor.  . If you are 66-76 years old, ask your health care provider if you should take aspirin to prevent strokes.  . Use sunscreen. Apply  sunscreen liberally and repeatedly throughout the day. You should seek shade when your shadow is shorter than you. Protect yourself by wearing long sleeves, pants, a wide-brimmed hat, and sunglasses year round, whenever you are outdoors.  . Once a month, do a whole body skin exam, using a mirror to look at the skin on your back. Tell your health care provider of new moles, moles that have irregular borders, moles that are larger than a pencil eraser, or moles that have changed in shape or color.

## 2017-12-22 ENCOUNTER — Other Ambulatory Visit: Payer: Self-pay | Admitting: Hematology

## 2017-12-24 ENCOUNTER — Ambulatory Visit (INDEPENDENT_AMBULATORY_CARE_PROVIDER_SITE_OTHER): Payer: Medicare Other | Admitting: Family Medicine

## 2017-12-24 ENCOUNTER — Encounter: Payer: Self-pay | Admitting: Family Medicine

## 2017-12-24 VITALS — BP 120/80 | HR 75 | Temp 97.8°F | Ht 69.5 in | Wt 204.2 lb

## 2017-12-24 DIAGNOSIS — N401 Enlarged prostate with lower urinary tract symptoms: Secondary | ICD-10-CM | POA: Diagnosis not present

## 2017-12-24 DIAGNOSIS — R972 Elevated prostate specific antigen [PSA]: Secondary | ICD-10-CM

## 2017-12-24 DIAGNOSIS — E782 Mixed hyperlipidemia: Secondary | ICD-10-CM | POA: Diagnosis not present

## 2017-12-24 DIAGNOSIS — Z23 Encounter for immunization: Secondary | ICD-10-CM | POA: Diagnosis not present

## 2017-12-24 DIAGNOSIS — I255 Ischemic cardiomyopathy: Secondary | ICD-10-CM

## 2017-12-24 DIAGNOSIS — N183 Chronic kidney disease, stage 3 unspecified: Secondary | ICD-10-CM

## 2017-12-24 DIAGNOSIS — C7800 Secondary malignant neoplasm of unspecified lung: Secondary | ICD-10-CM

## 2017-12-24 DIAGNOSIS — C439 Malignant melanoma of skin, unspecified: Secondary | ICD-10-CM

## 2017-12-24 DIAGNOSIS — C7931 Secondary malignant neoplasm of brain: Secondary | ICD-10-CM

## 2017-12-24 DIAGNOSIS — E039 Hypothyroidism, unspecified: Secondary | ICD-10-CM

## 2017-12-24 DIAGNOSIS — I25118 Atherosclerotic heart disease of native coronary artery with other forms of angina pectoris: Secondary | ICD-10-CM

## 2017-12-24 DIAGNOSIS — F331 Major depressive disorder, recurrent, moderate: Secondary | ICD-10-CM | POA: Diagnosis not present

## 2017-12-24 DIAGNOSIS — E349 Endocrine disorder, unspecified: Secondary | ICD-10-CM | POA: Diagnosis not present

## 2017-12-24 DIAGNOSIS — R7303 Prediabetes: Secondary | ICD-10-CM

## 2017-12-24 DIAGNOSIS — R4189 Other symptoms and signs involving cognitive functions and awareness: Secondary | ICD-10-CM | POA: Diagnosis not present

## 2017-12-24 DIAGNOSIS — I10 Essential (primary) hypertension: Secondary | ICD-10-CM | POA: Diagnosis not present

## 2017-12-24 DIAGNOSIS — M25511 Pain in right shoulder: Secondary | ICD-10-CM | POA: Diagnosis not present

## 2017-12-24 DIAGNOSIS — F1090 Alcohol use, unspecified, uncomplicated: Secondary | ICD-10-CM

## 2017-12-24 DIAGNOSIS — Z7289 Other problems related to lifestyle: Secondary | ICD-10-CM

## 2017-12-24 DIAGNOSIS — Z789 Other specified health status: Secondary | ICD-10-CM

## 2017-12-24 DIAGNOSIS — C61 Malignant neoplasm of prostate: Secondary | ICD-10-CM | POA: Insufficient documentation

## 2017-12-24 DIAGNOSIS — M7501 Adhesive capsulitis of right shoulder: Secondary | ICD-10-CM | POA: Diagnosis not present

## 2017-12-24 MED ORDER — VITAMIN D3 25 MCG (1000 UT) PO CAPS: 1.0000 | ORAL_CAPSULE | Freq: Every day | ORAL | Status: DC

## 2017-12-24 MED ORDER — LEVOTHYROXINE SODIUM 137 MCG PO TABS: ORAL_TABLET | ORAL | 11 refills | Status: DC

## 2017-12-24 NOTE — Progress Notes (Signed)
BP 120/80 (BP Location: Left Arm, Patient Position: Sitting, Cuff Size: Normal)   Pulse 75   Temp 97.8 F (36.6 C) (Oral)   Ht 5' 9.5" (1.765 m)   Wt 204 lb 4 oz (92.6 kg)   SpO2 94%   BMI 29.73 kg/m    CC: AMW f/u visit Subjective:    Patient ID: David Stall., male    DOB: November 10, 1940, 77 y.o.   MRN: [id number]  HPI: David Furches. is a 77 y.o. male presenting on 12/24/2017 for Annual Exam (Pt 2.)   Recent notes reviewed.  Saw oncology [name] - zoloft increased to 50mg  daily - back to 100mg  daily, melatonin to 10mg  for insomnia. Continues nivolumab.  Saw SM for adhesive capsulitis - undergoing ROM protocol rehab exercises and referred for formal PT after steroid injection into shoulder.  Saw neuro-oncology [name] - left leg weakness with atrophy thought stemming from lumbar disc disease.   Saw Lesia last week for medicare wellness visit. Note reviewed.    Testosterone deficiency receiving testosterone shots.   Preventative: COLONOSCOPY WITH PROPOFOL 05/08/2016 - diverticulosis, int hem, no f/u needed [name]).  Prostate cancer screening - no recent DRE. On testosterone. Nocturia x1, weakening of stream.  Flu shot - today Pneumovax 07/2012, previar 04/2016 and 10/2013 Tdap 07/2012 Shingrix - discussed Seat belt use discussed Sunscreen use discussed, no changing moles on skin. Avoids outdoors. Sees derm regularly for h/o MM.  Non smoker Alcohol - 3 bottles of wine a week (1 bottle at a time), occasional beer.  Dentist Q6 month Eye exam - not regular  Lives with wife with dementia in [facility] Daughter [name] and son Occ: retired Pharmacist, community Has living will [name] is health care [place] Requests DNR-- done 09/14/15 Not sure about tube feeds  Relevant past medical, surgical, family and social history reviewed and updated as indicated. Interim medical history since our last visit reviewed. Allergies and medications reviewed and  updated. Outpatient Medications Prior to Visit  Medication Sig Dispense Refill  . acetaminophen (TYLENOL) 500 MG tablet Take 500 mg by mouth at bedtime.    Marland Kitchen amLODipine (NORVASC) 5 MG tablet Take 1 tablet (5 mg total) by mouth daily. 90 tablet 3  . aspirin EC 81 MG tablet Take 81 mg by mouth daily.    . fluocinonide cream (LIDEX) 0.05 % Apply topically 2 (two) times daily as needed. 30 g 1  . metoprolol succinate (TOPROL-XL) 50 MG 24 hr tablet Take 1.5 tablets daily (75mg ). Take with or immediately following a meal. 135 tablet 3  . pentoxifylline (TRENTAL) 400 MG CR tablet Take 1 tablet (400 mg total) by mouth 2 (two) times daily. 60 tablet 6  . rosuvastatin (CRESTOR) 20 MG tablet TAKE ONE TABLET BY MOUTH AT BEDTIME 30 tablet 8  . sertraline (ZOLOFT) 100 MG tablet Take 1 tablet (100 mg total) by mouth daily.    Marland Kitchen testosterone cypionate (DEPOTESTOTERONE CYPIONATE) 100 MG/ML injection Inject 1 mL (100 mg total) into the muscle every 14 (fourteen) days. 10 mL 0  . Turmeric 500 MG CAPS Take 2 capsules by mouth daily.    . vitamin E (VITAMIN E) 400 UNIT capsule Take 1 capsule (400 Units total) by mouth 2 (two) times daily. 60 capsule 6  . levothyroxine (SYNTHROID, LEVOTHROID) 137 MCG tablet TAKE 1 TABLET BY MOUTH ONCE DAILY BEFORE BREAKFAST 30 tablet 1  . sertraline (ZOLOFT) 100 MG tablet Take 25 mg by mouth.  Facility-Administered Medications Prior to Visit  Medication Dose Route Frequency Provider Last Rate Last Dose  . 0.9 %  sodium chloride infusion  1,000 mL Intravenous Once [name], MD         Per HPI unless specifically indicated in ROS section below Review of Systems     Objective:    BP 120/80 (BP Location: Left Arm, Patient Position: Sitting, Cuff Size: Normal)   Pulse 75   Temp 97.8 F (36.6 C) (Oral)   Ht 5' 9.5" (1.765 m)   Wt 204 lb 4 oz (92.6 kg)   SpO2 94%   BMI 29.73 kg/m   Wt Readings from Last 3 Encounters:  12/24/17 204 lb 4 oz (92.6 kg)  12/21/17 202 lb  (91.6 kg)  12/03/17 206 lb 6.4 oz (93.6 kg)    Physical Exam  Constitutional: He is oriented to person, place, and time. He appears well-developed and well-nourished. No distress.  HENT:  Head: Normocephalic and atraumatic.  Right Ear: Hearing, tympanic membrane, external ear and ear canal normal.  Left Ear: Hearing, tympanic membrane, external ear and ear canal normal.  Nose: Nose normal.  Mouth/Throat: Uvula is midline, oropharynx is clear and moist and mucous membranes are normal. No oropharyngeal exudate, posterior oropharyngeal edema or posterior oropharyngeal erythema.  Eyes: Pupils are equal, round, and reactive to light. Conjunctivae and EOM are normal. No scleral icterus.  Neck: Normal range of motion. Neck supple. Carotid bruit is not present. No thyromegaly present.  Cardiovascular: Normal rate, regular rhythm, normal heart sounds and intact distal pulses.  No murmur heard. Pulses:      Radial pulses are 2+ on the right side, and 2+ on the left side.  Pulmonary/Chest: Effort normal and breath sounds normal. No respiratory distress. He has no wheezes. He has no rales.  Abdominal: Soft. Bowel sounds are normal. He exhibits no distension and no mass. There is no tenderness. There is no rebound and no guarding.  Genitourinary: Rectum normal and prostate normal. Rectal exam shows no external hemorrhoid, no internal hemorrhoid, no fissure, no mass, no tenderness and anal tone normal. Prostate is not enlarged (20gm) and not tender.  Musculoskeletal: Normal range of motion. He exhibits no edema.  Lymphadenopathy:    He has no cervical adenopathy.  Neurological: He is alert and oriented to person, place, and time.  CN grossly intact, station and gait intact  Skin: Skin is warm and dry. No rash noted.  Psychiatric: He has a normal mood and affect. His behavior is normal. Judgment and thought content normal.  Nursing note and vitals reviewed.  Results for orders placed or performed in  visit on 12/21/17  VITAMIN D 25 Hydroxy (Vit-D Deficiency, Fractures)  Result Value Ref Range   VITD 22.93 (L) 30.00 - 100.00 ng/mL  PSA  Result Value Ref Range   PSA 5.60 (H) 0.10 - 4.00 ng/mL  Hemoglobin A1c  Result Value Ref Range   Hgb A1c MFr Bld 6.3 4.6 - 6.5 %  Lipid panel  Result Value Ref Range   Cholesterol 164 0 - 200 mg/dL   Triglycerides 227.0 (H) 0.0 - 149.0 mg/dL   HDL 39.80 >39.00 mg/dL   VLDL 45.4 (H) 0.0 - 40.0 mg/dL   Total CHOL/HDL Ratio 4    NonHDL 124.56   LDL cholesterol, direct  Result Value Ref Range   Direct LDL 95.0 mg/dL   Lab Results  Component Value Date   TSH 3.448 12/17/2017    Lab Results  Component  Value Date   CREATININE 1.31 (H) 12/17/2017   BUN 21 12/17/2017   NA 139 12/17/2017   K 5.0 12/17/2017   CL 106 12/17/2017   CO2 24 12/17/2017       Assessment & Plan:   Problem List Items Addressed This Visit    Prediabetes    Encouraged limiting alcohol and other added sugars in diet.       Metastatic melanoma to lung ([place])   Metastasis to brain [facility])   MDD (major depressive disorder), recurrent episode, moderate (HCC)    Continues sertraline 100mg  daily.       Relevant Medications   sertraline (ZOLOFT) 100 MG tablet   Malignant melanoma ([place])    Metastatic to lungs, skull, brain and lymph nodes. Pt states primary site never found.       Hypotestosteronemia    Due for testosterone check. Will hold testosterone replacement at this time. See below for further discussion.       Relevant Orders   Testos,Total,Free and SHBG (Male)   PSA, Total with Reflex to PSA, Free   Hyperlipidemia    Chronic, triglycerides elevated but patient was not fasting when checked. Continue crestor 20mg  daily. LDL goal likely <70 given heart history. The ASCVD Risk score Mikey Bussing DC Jr., et al., 2013) failed to calculate for the following reasons:   The patient has a prior MI or stroke diagnosis       Elevated PSA - Primary    Reviewed trend over  the last 2 years as well as possible causes including BPH (pt carries this diagnosis) vs prostate cancer. He has been receiving testosterone injections for hypogonadism - recommend we stop testosterone supplementation for 3 months and reassess levels then decide on further management. Pt agrees with plan. RTC 3 mo f/u visit.       Relevant Orders   PSA, Total with Reflex to PSA, Free   Cognitive impairment    Stable period. No increased level of care needs identified.       Chronic kidney disease, stage III (moderate) (HCC)   Benign prostatic hyperplasia    Mild symptoms. Endorsed h/o this by prior PCP although no significant enlargement appreciated on exam today. See below.       Benign essential HTN    Chronic, stable. Continue antihypertensives.       Atherosclerosis of native coronary artery of native heart with stable angina pectoris ([place])    Latest LDL not at goal of LDL <70.  No changes made today.       Alcohol use    Discussed with patient today. Drinks 3 bottles of wine per week, one whole bottle per sitting. Alternatively may drink 2 beers at a time. Reviewed this is too high alcohol intake per sitting - agrees to limit alcohol to 2 glasses. After discussion of prediabetes, he may try stopping alcohol all together.      Acquired hypothyroidism    Chronic, stable. Continue current regimen.       Relevant Medications   levothyroxine (SYNTHROID, LEVOTHROID) 137 MCG tablet    Other Visit Diagnoses    Need for influenza vaccination       Relevant Orders   Flu Vaccine QUAD 36+ mos IM (Completed)       Meds ordered this encounter  Medications  . levothyroxine (SYNTHROID, LEVOTHROID) 137 MCG tablet    Sig: TAKE 1 TABLET BY MOUTH ONCE DAILY BEFORE BREAKFAST    Dispense:  30 tablet  Refill:  11  . Cholecalciferol (VITAMIN D3) 1000 units CAPS    Sig: Take 1 capsule (1,000 Units total) by mouth daily.    Dispense:  30 capsule   Orders Placed This Encounter   Procedures  . Flu Vaccine QUAD 36+ mos IM  . Testos,Total,Free and SHBG (Male)    Standing Status:   Future    Standing Expiration Date:   12/25/2018  . PSA, Total with Reflex to PSA, Free    Standing Status:   Future    Standing Expiration Date:   12/25/2018    Follow up plan: Return in about 3 months (around 03/25/2018) for follow up visit.  David Bush, MD

## 2017-12-24 NOTE — Assessment & Plan Note (Signed)
Due for testosterone check. Will hold testosterone replacement at this time. See below for further discussion.

## 2017-12-24 NOTE — Assessment & Plan Note (Signed)
Encouraged limiting alcohol and other added sugars in diet.

## 2017-12-24 NOTE — Assessment & Plan Note (Addendum)
Mild symptoms. Endorsed h/o this by prior PCP although no significant enlargement appreciated on exam today. See below.

## 2017-12-24 NOTE — Assessment & Plan Note (Deleted)
This remains to be reviewed,

## 2017-12-24 NOTE — Patient Instructions (Addendum)
Flu shot today.  If interested, check with [facility] about new 2 shot shingles series (shingrix).  Check with [name] if you should have another pneumovax shot (last pneumovax was 2014).  You are drinking too much wine - decrease amount of wine per sitting.  See eye doctor yearly.  Prostate levels increasing - let's hold testosterone for 3 months, return after this for office visit with 8 am labs prior.

## 2017-12-24 NOTE — Assessment & Plan Note (Addendum)
Chronic, triglycerides elevated but patient was not fasting when checked. Continue crestor 20mg  daily. LDL goal likely <70 given heart history. The ASCVD Risk score Mikey Bussing DC Jr., et al., 2013) failed to calculate for the following reasons:   The patient has a prior MI or stroke diagnosis

## 2017-12-24 NOTE — Assessment & Plan Note (Addendum)
Reviewed trend over the last 2 years as well as possible causes including BPH (pt carries this diagnosis) vs prostate cancer. He has been receiving testosterone injections for hypogonadism - recommend we stop testosterone supplementation for 3 months and reassess levels then decide on further management. Pt agrees with plan. RTC 3 mo f/u visit.

## 2017-12-24 NOTE — Assessment & Plan Note (Signed)
Stable period. No increased level of care needs identified.

## 2017-12-24 NOTE — Assessment & Plan Note (Signed)
Chronic, stable. Continue current regimen. 

## 2017-12-24 NOTE — Assessment & Plan Note (Signed)
Metastatic to lungs, skull, brain and lymph nodes. Pt states primary site never found.

## 2017-12-24 NOTE — Assessment & Plan Note (Signed)
Continues sertraline 100mg  daily.

## 2017-12-24 NOTE — Assessment & Plan Note (Signed)
Chronic, stable. Continue antihypertensives.

## 2017-12-24 NOTE — Assessment & Plan Note (Signed)
Discussed with patient today. Drinks 3 bottles of wine per week, one whole bottle per sitting. Alternatively may drink 2 beers at a time. Reviewed this is too high alcohol intake per sitting - agrees to limit alcohol to 2 glasses. After discussion of prediabetes, he may try stopping alcohol all together.

## 2017-12-24 NOTE — Assessment & Plan Note (Addendum)
Latest LDL not at goal of LDL <70.  No changes made today.

## 2017-12-26 ENCOUNTER — Telehealth: Payer: Self-pay | Admitting: Family Medicine

## 2017-12-26 DIAGNOSIS — G4733 Obstructive sleep apnea (adult) (pediatric): Secondary | ICD-10-CM

## 2017-12-26 NOTE — Telephone Encounter (Signed)
Will forward to Dr Ashby Dawes. Thank you, [name]

## 2017-12-26 NOTE — Telephone Encounter (Signed)
Copied from [place] [id number]. Topic: Quick Communication - See Telephone Encounter >> Dec 26, 2017 11:29 AM Gardiner Ramus wrote: CRM for notification. See Telephone encounter for: 12/26/17. Needing a replacement simplus by fisher paykel full face mask medium cushion for c-pap machine. Do we know where we can get this from please advise.

## 2017-12-27 ENCOUNTER — Other Ambulatory Visit: Payer: Self-pay | Admitting: Family Medicine

## 2017-12-27 DIAGNOSIS — M25511 Pain in right shoulder: Secondary | ICD-10-CM | POA: Diagnosis not present

## 2017-12-27 DIAGNOSIS — M7501 Adhesive capsulitis of right shoulder: Secondary | ICD-10-CM | POA: Diagnosis not present

## 2017-12-27 DIAGNOSIS — E349 Endocrine disorder, unspecified: Secondary | ICD-10-CM

## 2017-12-27 LAB — TESTOS,TOTAL,FREE AND SHBG (FEMALE)
Free Testosterone: 33 pg/mL (ref 30.0–135.0)
Sex Hormone Binding: 35 nmol/L (ref 22–77)
TESTOSTERONE, TOTAL, LC-MS-MS: 229 ng/dL — AB (ref 250–1100)

## 2017-12-27 NOTE — Telephone Encounter (Signed)
Pt called PCP for a replacement simplus by fisher paykel full face mask medium cushion for c-pap machine. Not sure why pt did not call his DME? Sonya please call patient instruct and him to call his DME for replacement supplies.

## 2017-12-27 NOTE — Telephone Encounter (Signed)
DME order placed for mask.

## 2017-12-27 NOTE — Addendum Note (Signed)
Addended by: Stephanie Coup on: 12/27/2017 12:11 PM   Modules accepted: Orders

## 2017-12-28 ENCOUNTER — Telehealth: Payer: Self-pay

## 2017-12-28 DIAGNOSIS — G4733 Obstructive sleep apnea (adult) (pediatric): Secondary | ICD-10-CM

## 2017-12-28 NOTE — Telephone Encounter (Signed)
We have placed a DME order for mask requested. Patient should be contacted by [place] in regards to shipment.  Thanks,  [place]

## 2017-12-28 NOTE — Addendum Note (Signed)
Addended by: Stephanie Coup on: 12/28/2017 04:52 PM   Modules accepted: Orders

## 2017-12-28 NOTE — Telephone Encounter (Signed)
Ok thank you. I was not sure if I need to look at it because the order for DME fell on our referral WQ.Kris Mouton, RMA

## 2017-12-28 NOTE — Telephone Encounter (Signed)
I wanted to follow up on this patient's request for DME supplies. This was routed to Dr Danise Mina his primary and he placed order for DME but per patient he was requesting Dr Mathis Fare office to take care of this and we have not been ordering this. I just wanted to make sure that we did not need to do anything for this or if there are any issues with this request. Thank Edrick Kins, RMA

## 2017-12-28 NOTE — Telephone Encounter (Signed)
done

## 2017-12-30 NOTE — Progress Notes (Signed)
[place]  [phone] [phone]) [phone]  Clinic Follow up Note   Patient Care Team: [name], MD as PCP - General (Family Medicine) 12/31/2017  SUMMARY OF ONCOLOGIC HISTORY: Oncology History   Metastatic melanoma [facility])   Staging form: Melanoma of the Skin, AJCC 7th Edition     Clinical stage from 09/21/2015: Stage IV [place], [place] [place]) - Signed by [name], MD on 10/09/2015       Malignant melanoma ([place])   09/16/2015 Imaging    PET scan showed a hypermetabolic 1.4NW lingular nodule, and a 29m right upper lobe lung nodule, left subclavicular nodes, and bone metastasis in the proximal right humerus and right femur.     09/21/2015 Initial Diagnosis    Metastatic melanoma ([facility]    09/21/2015 Initial Biopsy    Left subclavicular lymph node biopsy showed prostatic of melanoma    09/21/2015 Miscellaneous    BRAF V600K mutation (+)     10/04/2015 Imaging    CT head with without contrast showed a 2.0 x 1.6 x 1.7 cm superficial left frontal hyperdense lesion, with postcontrast enhancement, lying within the previous hemorrhagic area, concerning for solitary melanoma metastasis    10/06/2015 - 09/06/2016 Chemotherapy    Nivolumab 2418mevery 2 weeks, held due to his dexamethasone for radionecrosis     10/13/2015 - 10/13/2015 Radiation Therapy    10/13/2015 Preop SRS treatment: 14 Gy  to the left frontal lesion in 1 fraction    10/15/2015 Surgery    left front craniotomy and tumor excision     10/15/2015 Pathology Results    left frontal brain mass excision showed metastatic melanoma, 1.5X1.5X0.6cm    01/13/2016 Imaging    CT head without and with contrast showed ill-defined enhancement and dural thickening of the left frontal resection cavity may represent a post treatment changes or residual neoplasm. A new 6 mm hypodensity in the right cerebellar hemisphere with uncertainty enhancement may represent a metastasis or small brain parenchymal hemorrhage.     01/27/2016 - 01/27/2016 Radiation Therapy    01/27/16 SRS Treatment:  20 Gy in 1 fraction to a 6 mm right cerebellar brain met     03/29/2016 PET scan    PET 03/29/2016 IMPRESSION: 1. Overall there has been an interval response to therapy. The index lesions sided on previous exam it are less hypermetabolic when compared with the previous exam. 2. There is an intramuscular lesion within the right masseter which has increased FDG uptake compared with previous exam. 3. No new sites of disease identified. 4. Aortic atherosclerosis and infrarenal abdominal aortic aneurysm. Recommend followup by ultrasound in 3 years. This recommendation follows ACR consensus guidelines: White Paper of the ACR Incidental Findings Committee II on Vascular Findings. J Joellyn Ruedadiol 2013; 10:789-794    04/20/2016 Imaging    CT Head w wo contrast 1. Mild patchy enhancement at the left anterior frontal lobe treatment site (series 11, image 39) without mass effect and stable surrounding white matter hypodensity without strong evidence of acute vasogenic edema. Recommend continued CT surveillance. 2. No new metastatic disease or new intracranial abnormality identified. 3. Probable benign 14 mm left parietal bone lucent lesion, unchanged since May 2017.    05/06/2016 - 05/08/2016 Hospital Admission    Pt was admitted for rectal bleeding for one day. Breathing spontaneously resolved, hemoglobin dropped from 15 to 10.5, did not require blood transfusion. Colonoscopy showed no active bleeding, except diverticulosis and hemorrhoid. He was discharged to home.  05/08/2016 Procedure    Colonoscopy  - Stool in the ascending colon. Fluid aspiration performed. - Diverticulosis in the entire examined colon. - Non-bleeding internal hemorrhoids. - The examination was otherwise normal on direct and retroflexion views.    05/29/2016 Imaging    PET Image Restaging IMPRESSION: 1. Stable exam with no evidence  progression. 2. Photopenia in the LEFT frontal lobe at prior surgical site. 3. Stable mildly hypermetabolic RIGHT paratracheal node. This may represent a thyroid nodule. 4. Stable size of minimally metabolic lingular pulmonary nodule. 5. Stable skeletal lesion of the RIGHT iliac crest 6. No cutaneous metabolic lesion.    07/20/2016 Imaging    CT Head W WO Contrast IMPRESSION: Pronounced change in the left frontal region. Much more regional vasogenic edema. Relatively stable appearance of the abnormal enhancement extending from the lateral margin of the frontal horn of left lateral ventricle to the frontal brain surface. New region of abnormal enhancement surrounding the frontal horn of the left lateral ventricle measuring approximately 3.2 cm. The differential diagnosis is recurrent tumor versus radiation necrosis. The patient is reportedly not an MRI candidate. This area is large enough that CT perfusion could possibly make a reasonable differentiation.    09/02/2016 Imaging    CT Head 09/02/16 IMPRESSION: Somewhat mixed pattern of slight posterior progression of pleomorphic enhancement surrounding the LEFT lateral ventricle although no significant mass effect, and reduced vasogenic edema compared with the scan in April.    09/27/2016 - 11/02/2016 Chemotherapy    Start dabrafenib and trametinib 09/27/16, stopped due to severe fatigue      10/05/2016 Imaging    CT Head w & w/o contrast  IMPRESSION: 1. Decreased size of the enhancing area within the left frontal lobe treatment site. Surrounding edema has also decreased. The findings support continued evolution of radiation necrosis. 2. No new enhancing lesions.    10/18/2016 PET scan    IMPRESSION: 1. New focal hypermetabolism in the proximal sigmoid colon with associated focal colonic wall thickening and mild pericolonic fat stranding at the site of a proximal sigmoid diverticulum, favored to represent mild acute sigmoid  diverticulitis. Metastatic disease is considered less likely. Clinical correlation is necessary. Consider appropriate antibiotic therapy, as clinically warranted. Attention to this region recommended on future PET-CT follow-up. 2. Otherwise complete metabolic response with no evidence of hypermetabolic metastatic disease. Lingular pulmonary nodule is slightly decreased in size and demonstrates no significant metabolism. 3. No residual hypermetabolism in the right thyroid lobe nodule, which is stable in size. 4. Aortic Atherosclerosis (ICD10-I70.0). Stable 3.1 cm infrarenal abdominal aortic aneurysm. Aortic aneurysm NOS (ICD10-I71.9). Recommend followup by ultrasound in 3 years. This recommendation follows ACR consensus guidelines: White Paper of the ACR Incidental Findings Committee II on Vascular Findings. Joellyn Rued Radiol 2013; 62:836-629.    11/01/2016 - 11/04/2016 Hospital Admission    Patient admitted to the hospital due to hypercalcemia where he received IV fluids and pamidronate    12/13/2016 PET scan    PET 12/13/16 IMPRESSION: 1. No findings to suggest metastatic disease on today's examination. 2. Previously noted hypermetabolism in the sigmoid colon has resolved, presumably indicative of a focus of diverticulitis on the prior study. Today's study does demonstrate relatively diffuse hypermetabolism throughout the cecum, ascending colon and proximal transverse colon where there is some mild mural thickening, favored to reflect mild chronic inflammation related to underlying diverticular disease. No definite inflammatory changes on the CT portion of the examination to strongly suggest an active acute diverticulitis at this time. No  discrete lesion to suggest neoplasm. 3. Previously noted 8 mm lingular nodule is stable in size and known remains non hypermetabolic. This is nonspecific but reassuring. Continued attention on future followup studies is recommended. 4. Aortic  atherosclerosis, in addition to left main and 3 vessel coronary artery disease. Status post median sternotomy for CABG including LIMA to the LAD. In addition, there is mild fusiform aneurysmal dilatation of the infrarenal abdominal aorta (3.1 cm in diameter) as well as aneurysmal dilatation of the left common iliac    12/18/2016 -  Chemotherapy    Restarted nivolamab 23m every 2 weeks, with tapering dose dexamethasone      04/18/2017 Imaging    PET Scan IMPRESSION: 1. No hypermetabolic lesion to suggest active malignancy. 2. Stable 8 mm lingular nodule is not currently hypermetabolic but merit surveillance. Infrarenal abdominal aortic aneurysm 3.1 cm in diameter. Recommend followup by [facility] 3 years. 3. Other imaging findings of potential clinical significance: Postoperative findings in the left frontal lobe. Right renal cyst. Aortic Atherosclerosis (ICD10-I70.0). Osteoarthritis. Lumbar scoliosis. Left knee effusion. Colonic diverticulosis.     10/10/2017 PET scan    IMPRESSION: 1. Small focus of FDG accumulation in the subcutaneous fat of the right gluteal region, nonspecific. Attention on follow-up suggested. 2. Otherwise stable exam without new or progressive hypermetabolic disease in the interval since prior PET-CT. 3. Stable 8 mm nodule in the lingula. No evidence for hypermetabolism on PET imaging. 4.  Aortic Atherosclerois (ICD10-170.0)    CURRENT THERAPY:   -Monthly Xgeva starting 02/2016 -Restarted nivolamab on 12/18/2016 and changed to every 4 weeks on 01/30/2017, changed back to every 2 weeks due to fatigue on 04/23/17  INTERVAL HISTORY: Dr. HKenton Kingfisherreturns for f/u and last cycle Nivolumab as scheduled. He feels fair, at his baseline. He had a nice family get together over the weekend. His activity level is "medium at best." He continues PT on right shoulder. 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 Denies change in appetite or n/v/c/d. No recent  fever, chills, cough, chest pain, dyspnea, or leg edema. His rashes have resolved and no longer using Lidex; denies new rash. Denies pain. He continues to see Dr. VMickeal Skinner Recently, he has PCP who recommended he stop testosterone due to enlarging prostate. He was also told to start vitamin D. He is currently on zoloft 100 mg and feels well with this dose.    MEDICAL HISTORY:  Past Medical History:  Diagnosis Date  . [place]inal pain ([place]   . Arthritis    mostly hands  . Benign familial hematuria   . BPH (benign prostatic hypertrophy)   . Brain cancer ([facility]    melanoma with brain met  . Coronary artery disease   . GERD (gastroesophageal reflux disease)   . High cholesterol   . Hypertension   . Hypothyroidism   . Intracerebral hemorrhage ([place]   . Melanoma ([place]   . Metastatic melanoma to lung ([place]   . Mood disorder ([place]   . Myocardial infarction ([place]   . Pacemaker    due to syncope, 3rd degree HB ([facility]/25/13 (Dr. BUvaldo Rising  . Rectal bleed    due to NSAIDS  . Renal disorder   . Skin cancer    melanoma  . Sleep apnea    uses CPAP  . Stroke ([facility]     SURGICAL HISTORY: Past Surgical History:  Procedure Laterality Date  . APPLICATION OF CRANIAL NAVIGATION N/A 10/15/2015  Procedure: APPLICATION OF CRANIAL NAVIGATION;  Surgeon: Erline Levine, MD;  Location: [place] NEURO ORS;  Service: Neurosurgery;  Laterality: N/A;  . CARDIAC CATHETERIZATION  2013  . CARDIAC SURGERY     bypass X 2  . COLONOSCOPY WITH PROPOFOL N/A 05/08/2016   diverticulosis, int hem, no f/u needed [name])  . CORONARY ARTERY BYPASS GRAFT  2013   LIMA-LAD, SVG-PDA 03/27/11 (Dr. Francee Gentile)  . CRANIOTOMY N/A 10/15/2015   Procedure: LEFT FRONTAL CRANIOTOMY TUMOR EXCISION with Curve;  Surgeon: Erline Levine, MD;  Location: [place] NEURO ORS;  Service: Neurosurgery;  Laterality: N/A;  CRANIOTOMY TUMOR EXCISION  . EYE SURGERY    . FOOT SURGERY  08/2010  . JOINT REPLACEMENT Left    partial knee  . KNEE  ARTHROSCOPY  12/2008  . LYMPH NODE BIOPSY    . PACEMAKER INSERTION    . TONSILLECTOMY      I have reviewed the social history and family history with the patient and they are unchanged from previous note.  ALLERGIES:  is allergic to ezetimibe and simvastatin.  MEDICATIONS:  Current Outpatient Medications  Medication Sig Dispense Refill  . aspirin EC 81 MG tablet Take 81 mg by mouth daily.    . Cholecalciferol (VITAMIN D3) 1000 units CAPS Take 1 capsule (1,000 Units total) by mouth daily. 30 capsule   . levothyroxine (SYNTHROID, LEVOTHROID) 137 MCG tablet TAKE 1 TABLET BY MOUTH ONCE DAILY BEFORE BREAKFAST 30 tablet 11  . metoprolol succinate (TOPROL-XL) 50 MG 24 hr tablet Take 1.5 tablets daily (53m). Take with or immediately following a meal. 135 tablet 3  . pentoxifylline (TRENTAL) 400 MG CR tablet Take 1 tablet (400 mg total) by mouth 2 (two) times daily. 60 tablet 6  . rosuvastatin (CRESTOR) 20 MG tablet TAKE ONE TABLET BY MOUTH AT BEDTIME 30 tablet 8  . sertraline (ZOLOFT) 100 MG tablet Take 1 tablet (100 mg total) by mouth daily.    . Turmeric 500 MG CAPS Take 2 capsules by mouth daily.    . vitamin E (VITAMIN E) 400 UNIT capsule Take 1 capsule (400 Units total) by mouth 2 (two) times daily. 60 capsule 6  . acetaminophen (TYLENOL) 500 MG tablet Take 500 mg by mouth at bedtime.    .Marland KitchenamLODipine (NORVASC) 5 MG tablet Take 1 tablet (5 mg total) by mouth daily. 90 tablet 3  . fluocinonide cream (LIDEX) 0.05 % Apply topically 2 (two) times daily as needed. 30 g 1  . testosterone cypionate (DEPOTESTOTERONE CYPIONATE) 100 MG/ML injection Inject 1 mL (100 mg total) into the muscle every 14 (fourteen) days. 10 mL 0   No current facility-administered medications for this visit.    Facility-Administered Medications Ordered in Other Visits  Medication Dose Route Frequency Provider Last Rate Last Dose  . 0.9 %  sodium chloride infusion  1,000 mL Intravenous Once FTruitt Merle MD        PHYSICAL  EXAMINATION: ECOG PERFORMANCE STATUS: 1 - Symptomatic but completely ambulatory  Vitals:   12/31/17 0913  BP: 122/82  Pulse: 75  Resp: 18  Temp: 97.6 F (36.4 C)  SpO2: 96%   Filed Weights   12/31/17 0913  Weight: 203 lb 11.2 oz (92.4 kg)    GENERAL:alert, no distress and comfortable SKIN: no rashes or significant lesions EYES: sclera clear OROPHARYNX:no thrush or ulcers LYMPH:  no palpable cervical or supraclavicular lymphadenopathy LUNGS: clear to auscultation with normal breathing effort HEART: regular rate & rhythm; no lower extremity edema ABDOMEN:abdomen soft, non-tender and  normal bowel sounds Musculoskeletal:no cyanosis of digits and no clubbing  NEURO: alert & oriented x 3 with fluent speech   LABORATORY DATA:  I have reviewed the data as listed CBC Latest Ref Rng & Units 12/31/2017 12/17/2017 12/03/2017  WBC 4.0 - 10.3 K/uL 4.2 4.5 5.5  Hemoglobin 13.0 - 17.1 g/dL 15.2 15.8 16.2  Hematocrit 38.4 - 49.9 % 45.3 46.9 47.8  Platelets 140 - 400 K/uL 157 175 158     CMP Latest Ref Rng & Units 12/31/2017 12/17/2017 12/03/2017  Glucose 70 - 99 mg/dL 175(H) 137(H) 138(H)  BUN 8 - 23 mg/dL 30(H) 21 29(H)  Creatinine 0.61 - 1.24 mg/dL 1.43(H) 1.31(H) 1.45(H)  Sodium 135 - 145 mmol/L 142 139 140  Potassium 3.5 - 5.1 mmol/L 4.5 5.0 4.5  Chloride 98 - 111 mmol/L 109 106 105  CO2 22 - 32 mmol/L 22 24 25   Calcium 8.9 - 10.3 mg/dL 8.8(L) 9.6 9.5  Total Protein 6.5 - 8.1 g/dL 7.2 7.0 6.7  Total Bilirubin 0.3 - 1.2 mg/dL 0.5 0.5 0.5  Alkaline Phos 38 - 126 U/L 62 59 54  AST 15 - 41 U/L 19 27 24   ALT 0 - 44 U/L 15 25 28       RADIOGRAPHIC STUDIES: I have personally reviewed the radiological images as listed and agreed with the findings in the report. No results found.   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   1. Metastatic  melanoma to lung, node, bone and brain, stage IV, BRAF V600K mutation (+) 2. Skin rash- Pruritic maculopapular rash to back, arms, hands; scaly plaque rash to legs 3. History of hemorrhagic stroke and brain metastasis  4. Memory loss and cognitive dysfunction 5.  Hypercalcemia and CKD stage III  6. Fatigue, hypothyroidism and hypotestosteronemia 7. HTN   8. Depression  9. Goal of care discussion  10. CAD   Dr. Kenton Kingfisher appears stable. He completed 46 cycles of Nivolumab. He tolerates treatment well overall. His fatigue and activity level remain at baseline. He has no other significant toxicities. His mood and sleep pattern are stable on zoloft 100 mg, he will continue. I encouraged him to remain physically active.   Labs reviewed, CBC, CMP, TSH are stable. His PCP is holding testosterone due to enlarging prostate. He continues f/u with PCP. He is also followed by Dr. Mickeal Skinner for his cognitive function, will f/u in 02/2018 with him.   I recommend he proceed with final cycle today to complete 2 years of therapy. Will repeat staging PET in October, I ordered today. He will f/u in 1 month to review scan. Continue monthly xgeva   Plan -labs reviewed -Proceed with final nivolumab today -PET in 1 month, ordered today -F/u with Dr. Burr Medico and xgeva in 1 month, after PET -continue f/u with PCP, Dr. Mickeal Skinner   Orders Placed This Encounter  Procedures  . NM PET Image Restage (PS) Whole Body    Standing Status:   Future    Standing Expiration Date:   12/31/2018    Order Specific Question:   ** REASON FOR EXAM (FREE TEXT)    Answer:   melanoma, completed nivolumab 9/23, follow up    Order Specific Question:   If indicated for the ordered procedure, I authorize the administration of a radiopharmaceutical per Radiology protocol    Answer:   Yes    Order Specific Question:  Preferred imaging location?    Answer:   [facility]    Order Specific Question:   Radiology Contrast Protocol - do NOT  remove file path    Answer:   [email or web address]   All questions were answered. The patient knows to call the clinic with any problems, questions or concerns. No barriers to learning was detected.     Alla Feeling, NP 12/31/17

## 2017-12-31 ENCOUNTER — Inpatient Hospital Stay: Payer: Medicare Other

## 2017-12-31 ENCOUNTER — Other Ambulatory Visit: Payer: Medicare Other

## 2017-12-31 ENCOUNTER — Telehealth: Payer: Self-pay | Admitting: Hematology

## 2017-12-31 ENCOUNTER — Inpatient Hospital Stay (HOSPITAL_BASED_OUTPATIENT_CLINIC_OR_DEPARTMENT_OTHER): Payer: Medicare Other | Admitting: Nurse Practitioner

## 2017-12-31 ENCOUNTER — Encounter: Payer: Self-pay | Admitting: Nurse Practitioner

## 2017-12-31 VITALS — BP 122/82 | HR 75 | Temp 97.6°F | Resp 18 | Ht 69.5 in | Wt 203.7 lb

## 2017-12-31 DIAGNOSIS — Z8673 Personal history of transient ischemic attack (TIA), and cerebral infarction without residual deficits: Secondary | ICD-10-CM

## 2017-12-31 DIAGNOSIS — C770 Secondary and unspecified malignant neoplasm of lymph nodes of head, face and neck: Secondary | ICD-10-CM | POA: Diagnosis not present

## 2017-12-31 DIAGNOSIS — C7951 Secondary malignant neoplasm of bone: Secondary | ICD-10-CM | POA: Diagnosis not present

## 2017-12-31 DIAGNOSIS — C439 Malignant melanoma of skin, unspecified: Secondary | ICD-10-CM

## 2017-12-31 DIAGNOSIS — F329 Major depressive disorder, single episode, unspecified: Secondary | ICD-10-CM

## 2017-12-31 DIAGNOSIS — I2510 Atherosclerotic heart disease of native coronary artery without angina pectoris: Secondary | ICD-10-CM

## 2017-12-31 DIAGNOSIS — E291 Testicular hypofunction: Secondary | ICD-10-CM

## 2017-12-31 DIAGNOSIS — Z23 Encounter for immunization: Secondary | ICD-10-CM

## 2017-12-31 DIAGNOSIS — C7800 Secondary malignant neoplasm of unspecified lung: Secondary | ICD-10-CM | POA: Diagnosis not present

## 2017-12-31 DIAGNOSIS — I10 Essential (primary) hypertension: Secondary | ICD-10-CM

## 2017-12-31 DIAGNOSIS — Z5112 Encounter for antineoplastic immunotherapy: Secondary | ICD-10-CM | POA: Diagnosis not present

## 2017-12-31 DIAGNOSIS — C7931 Secondary malignant neoplasm of brain: Secondary | ICD-10-CM | POA: Diagnosis not present

## 2017-12-31 DIAGNOSIS — C799 Secondary malignant neoplasm of unspecified site: Secondary | ICD-10-CM

## 2017-12-31 DIAGNOSIS — R5383 Other fatigue: Secondary | ICD-10-CM

## 2017-12-31 DIAGNOSIS — E039 Hypothyroidism, unspecified: Secondary | ICD-10-CM

## 2017-12-31 DIAGNOSIS — I619 Nontraumatic intracerebral hemorrhage, unspecified: Secondary | ICD-10-CM

## 2017-12-31 LAB — COMPREHENSIVE METABOLIC PANEL
ALBUMIN: 3.7 g/dL (ref 3.5–5.0)
ALT: 15 U/L (ref 0–44)
AST: 19 U/L (ref 15–41)
Alkaline Phosphatase: 62 U/L (ref 38–126)
Anion gap: 11 (ref 5–15)
BILIRUBIN TOTAL: 0.5 mg/dL (ref 0.3–1.2)
BUN: 30 mg/dL — AB (ref 8–23)
CO2: 22 mmol/L (ref 22–32)
Calcium: 8.8 mg/dL — ABNORMAL LOW (ref 8.9–10.3)
Chloride: 109 mmol/L (ref 98–111)
Creatinine, Ser: 1.43 mg/dL — ABNORMAL HIGH (ref 0.61–1.24)
GFR calc Af Amer: 53 mL/min — ABNORMAL LOW (ref >60)
GFR calc non Af Amer: 45 mL/min — ABNORMAL LOW (ref >60)
GLUCOSE: 175 mg/dL — AB (ref 70–99)
POTASSIUM: 4.5 mmol/L (ref 3.5–5.1)
SODIUM: 142 mmol/L (ref 135–145)
TOTAL PROTEIN: 7.2 g/dL (ref 6.5–8.1)

## 2017-12-31 LAB — CBC WITH DIFFERENTIAL/PLATELET
BASOS ABS: 0 10*3/uL (ref 0.0–0.1)
Basophils Relative: 1 %
EOS ABS: 0.2 10*3/uL (ref 0.0–0.5)
EOS PCT: 6 %
HEMATOCRIT: 45.3 % (ref 38.4–49.9)
HEMOGLOBIN: 15.2 g/dL (ref 13.0–17.1)
LYMPHS PCT: 11 %
Lymphs Abs: 0.5 10*3/uL — ABNORMAL LOW (ref 0.9–3.3)
MCH: 31.3 pg (ref 27.2–33.4)
MCHC: 33.6 g/dL (ref 32.0–36.0)
MCV: 93.2 fL (ref 79.3–98.0)
Monocytes Absolute: 0.3 10*3/uL (ref 0.1–0.9)
Monocytes Relative: 8 %
Neutro Abs: 3.1 10*3/uL (ref 1.5–6.5)
Neutrophils Relative %: 74 %
Platelets: 157 10*3/uL (ref 140–400)
RBC: 4.86 MIL/uL (ref 4.20–5.82)
RDW: 13.2 % (ref 11.0–14.6)
WBC: 4.2 10*3/uL (ref 4.0–10.3)

## 2017-12-31 LAB — TSH: TSH: 1.981 u[IU]/mL (ref 0.320–4.118)

## 2017-12-31 MED ORDER — SODIUM CHLORIDE 0.9 % IV SOLN
240.0000 mg | Freq: Once | INTRAVENOUS | Status: AC
  Administered 2017-12-31: 240 mg via INTRAVENOUS
  Filled 2017-12-31: qty 24

## 2017-12-31 MED ORDER — INFLUENZA VAC SPLIT QUAD 0.5 ML IM SUSY
0.5000 mL | PREFILLED_SYRINGE | Freq: Once | INTRAMUSCULAR | Status: AC
  Administered 2017-12-31: 0.5 mL via INTRAMUSCULAR

## 2017-12-31 MED ORDER — INFLUENZA VAC SPLIT QUAD 0.5 ML IM SUSY
PREFILLED_SYRINGE | INTRAMUSCULAR | Status: AC
  Filled 2017-12-31: qty 0.5

## 2017-12-31 MED ORDER — SODIUM CHLORIDE 0.9 % IV SOLN
Freq: Once | INTRAVENOUS | Status: AC
  Administered 2017-12-31: 10:00:00 via INTRAVENOUS
  Filled 2017-12-31: qty 250

## 2017-12-31 NOTE — Patient Instructions (Signed)
[facility] Discharge Instructions for Patients Receiving Chemotherapy  Today you received the following chemotherapy agents Opdivo  To help prevent nausea and vomiting after your treatment, we encourage you to take your nausea medication as directed   If you develop nausea and vomiting that is not controlled by your nausea medication, call the clinic.   BELOW ARE SYMPTOMS THAT SHOULD BE REPORTED IMMEDIATELY:  *FEVER GREATER THAN 100.5 F  *CHILLS WITH OR WITHOUT FEVER  NAUSEA AND VOMITING THAT IS NOT CONTROLLED WITH YOUR NAUSEA MEDICATION  *UNUSUAL SHORTNESS OF BREATH  *UNUSUAL BRUISING OR BLEEDING  TENDERNESS IN MOUTH AND THROAT WITH OR WITHOUT PRESENCE OF ULCERS  *URINARY PROBLEMS  *BOWEL PROBLEMS  UNUSUAL RASH Items with * indicate a potential emergency and should be followed up as soon as possible.  Feel free to call the clinic should you have any questions or concerns. The clinic phone number is [phone].  Please show the CHEMO ALERT CARD at check-in to the Emergency Department and triage nurse.   

## 2017-12-31 NOTE — Telephone Encounter (Signed)
Appts scheduled patient notified letter/calendar mailed per 9/23 los

## 2018-01-01 DIAGNOSIS — M25511 Pain in right shoulder: Secondary | ICD-10-CM | POA: Diagnosis not present

## 2018-01-01 DIAGNOSIS — M7501 Adhesive capsulitis of right shoulder: Secondary | ICD-10-CM | POA: Diagnosis not present

## 2018-01-02 ENCOUNTER — Other Ambulatory Visit: Payer: Self-pay | Admitting: Family Medicine

## 2018-01-04 DIAGNOSIS — M7501 Adhesive capsulitis of right shoulder: Secondary | ICD-10-CM | POA: Diagnosis not present

## 2018-01-04 DIAGNOSIS — M25511 Pain in right shoulder: Secondary | ICD-10-CM | POA: Diagnosis not present

## 2018-01-07 ENCOUNTER — Other Ambulatory Visit: Payer: Self-pay | Admitting: *Deleted

## 2018-01-07 DIAGNOSIS — C7931 Secondary malignant neoplasm of brain: Secondary | ICD-10-CM

## 2018-01-08 NOTE — Progress Notes (Signed)
I reviewed health advisor's note, was available for consultation, and agree with documentation and plan.  

## 2018-01-11 ENCOUNTER — Telehealth: Payer: Self-pay | Admitting: Hematology

## 2018-01-11 NOTE — Telephone Encounter (Signed)
Per 10/4 sch message - left message for daughter to call back to r;/s appt per sch message   Message : Provider: [name], MD  Appointment Notes:  please move his appointments to late that week, call his daughter to schedule   please also scheudle is PET to be done before next OV

## 2018-01-28 ENCOUNTER — Ambulatory Visit: Payer: Medicare Other

## 2018-01-28 ENCOUNTER — Ambulatory Visit: Payer: Medicare Other | Admitting: Hematology

## 2018-01-28 ENCOUNTER — Other Ambulatory Visit: Payer: Medicare Other

## 2018-01-28 NOTE — Progress Notes (Signed)
[place]  [phone] [phone]) [phone]  Clinic Follow up Note   Patient Care Team: [name], MD as PCP - General (Family Medicine)   Date of Service:  01/30/2018  Chief complaint: Follow-up metastatic melanoma   Oncology History   Metastatic melanoma [facility])   Staging form: Melanoma of the Skin, AJCC 7th Edition     Clinical stage from 09/21/2015: Stage IV [place], [place], [place]) - Signed by [name], MD on 10/09/2015       Malignant melanoma ([place])   09/16/2015 Imaging    PET scan showed a hypermetabolic 6.2XB lingular nodule, and a 68m right upper lobe lung nodule, left subclavicular nodes, and bone metastasis in the proximal right humerus and right femur.     09/21/2015 Initial Diagnosis    Metastatic melanoma [place]    09/21/2015 Initial Biopsy    Left subclavicular lymph node biopsy showed prostatic of melanoma    09/21/2015 Miscellaneous    BRAF V600K mutation (+)     10/04/2015 Imaging    CT head with without contrast showed a 2.0 x 1.6 x 1.7 cm superficial left frontal hyperdense lesion, with postcontrast enhancement, lying within the previous hemorrhagic area, concerning for solitary melanoma metastasis    10/06/2015 - 09/06/2016 Chemotherapy    Nivolumab '240mg'$  every 2 weeks, held due to his dexamethasone for radionecrosis     10/13/2015 - 10/13/2015 Radiation Therapy    10/13/2015 Preop SRS treatment: 14 Gy  to the left frontal lesion in 1 fraction    10/15/2015 Surgery    left front craniotomy and tumor excision     10/15/2015 Pathology Results    left frontal brain mass excision showed metastatic melanoma, 1.5X1.5X0.6cm    01/13/2016 Imaging    CT head without and with contrast showed ill-defined enhancement and dural thickening of the left frontal resection cavity may represent a post treatment changes or residual neoplasm. A new 6 mm hypodensity in the right cerebellar hemisphere with uncertainty enhancement may represent a metastasis or small  brain parenchymal hemorrhage.     01/27/2016 - 01/27/2016 Radiation Therapy    01/27/16 SRS Treatment:  20 Gy in 1 fraction to a 6 mm right cerebellar brain met     03/29/2016 PET scan    PET 03/29/2016 IMPRESSION: 1. Overall there has been an interval response to therapy. The index lesions sided on previous exam it are less hypermetabolic when compared with the previous exam. 2. There is an intramuscular lesion within the right masseter which has increased FDG uptake compared with previous exam. 3. No new sites of disease identified. 4. Aortic atherosclerosis and infrarenal abdominal aortic aneurysm. Recommend followup by ultrasound in 3 years. This recommendation follows ACR consensus guidelines: White Paper of the ACR Incidental Findings Committee II on Vascular Findings. JJoellyn RuedRadiol 2013; 10:789-794    04/20/2016 Imaging    CT Head w wo contrast 1. Mild patchy enhancement at the left anterior frontal lobe treatment site (series 11, image 39) without mass effect and stable surrounding white matter hypodensity without strong evidence of acute vasogenic edema. Recommend continued CT surveillance. 2. No new metastatic disease or new intracranial abnormality identified. 3. Probable benign 14 mm left parietal bone lucent lesion, unchanged since May 2017.    05/06/2016 - 05/08/2016 Hospital Admission    Pt was admitted for rectal bleeding for one day. Breathing spontaneously resolved, hemoglobin dropped from 15 to 10.5, did not require blood transfusion. Colonoscopy showed no active bleeding, except diverticulosis  and hemorrhoid. He was discharged to home.    05/08/2016 Procedure    Colonoscopy  - Stool in the ascending colon. Fluid aspiration performed. - Diverticulosis in the entire examined colon. - Non-bleeding internal hemorrhoids. - The examination was otherwise normal on direct and retroflexion views.    05/29/2016 Imaging    PET Image Restaging IMPRESSION: 1. Stable  exam with no evidence progression. 2. Photopenia in the LEFT frontal lobe at prior surgical site. 3. Stable mildly hypermetabolic RIGHT paratracheal node. This may represent a thyroid nodule. 4. Stable size of minimally metabolic lingular pulmonary nodule. 5. Stable skeletal lesion of the RIGHT iliac crest 6. No cutaneous metabolic lesion.    07/20/2016 Imaging    CT Head W WO Contrast IMPRESSION: Pronounced change in the left frontal region. Much more regional vasogenic edema. Relatively stable appearance of the abnormal enhancement extending from the lateral margin of the frontal horn of left lateral ventricle to the frontal brain surface. New region of abnormal enhancement surrounding the frontal horn of the left lateral ventricle measuring approximately 3.2 cm. The differential diagnosis is recurrent tumor versus radiation necrosis. The patient is reportedly not an MRI candidate. This area is large enough that CT perfusion could possibly make a reasonable differentiation.    09/02/2016 Imaging    CT Head 09/02/16 IMPRESSION: Somewhat mixed pattern of slight posterior progression of pleomorphic enhancement surrounding the LEFT lateral ventricle although no significant mass effect, and reduced vasogenic edema compared with the scan in April.    09/27/2016 - 11/02/2016 Chemotherapy    Start dabrafenib and trametinib 09/27/16, stopped due to severe fatigue      10/05/2016 Imaging    CT Head w & w/o contrast  IMPRESSION: 1. Decreased size of the enhancing area within the left frontal lobe treatment site. Surrounding edema has also decreased. The findings support continued evolution of radiation necrosis. 2. No new enhancing lesions.    10/18/2016 PET scan    IMPRESSION: 1. New focal hypermetabolism in the proximal sigmoid colon with associated focal colonic wall thickening and mild pericolonic fat stranding at the site of a proximal sigmoid diverticulum, favored to represent mild  acute sigmoid diverticulitis. Metastatic disease is considered less likely. Clinical correlation is necessary. Consider appropriate antibiotic therapy, as clinically warranted. Attention to this region recommended on future PET-CT follow-up. 2. Otherwise complete metabolic response with no evidence of hypermetabolic metastatic disease. Lingular pulmonary nodule is slightly decreased in size and demonstrates no significant metabolism. 3. No residual hypermetabolism in the right thyroid lobe nodule, which is stable in size. 4. Aortic Atherosclerosis (ICD10-I70.0). Stable 3.1 cm infrarenal abdominal aortic aneurysm. Aortic aneurysm NOS (ICD10-I71.9). Recommend followup by ultrasound in 3 years. This recommendation follows ACR consensus guidelines: White Paper of the ACR Incidental Findings Committee II on Vascular Findings. Joellyn Rued Radiol 2013; 32:951-884.    11/01/2016 - 11/04/2016 Hospital Admission    Patient admitted to the hospital due to hypercalcemia where he received IV fluids and pamidronate    12/13/2016 PET scan    PET 12/13/16 IMPRESSION: 1. No findings to suggest metastatic disease on today's examination. 2. Previously noted hypermetabolism in the sigmoid colon has resolved, presumably indicative of a focus of diverticulitis on the prior study. Today's study does demonstrate relatively diffuse hypermetabolism throughout the cecum, ascending colon and proximal transverse colon where there is some mild mural thickening, favored to reflect mild chronic inflammation related to underlying diverticular disease. No definite inflammatory changes on the CT portion of the examination to  strongly suggest an active acute diverticulitis at this time. No discrete lesion to suggest neoplasm. 3. Previously noted 8 mm lingular nodule is stable in size and known remains non hypermetabolic. This is nonspecific but reassuring. Continued attention on future followup studies is recommended. 4.  Aortic atherosclerosis, in addition to left main and 3 vessel coronary artery disease. Status post median sternotomy for CABG including LIMA to the LAD. In addition, there is mild fusiform aneurysmal dilatation of the infrarenal abdominal aorta (3.1 cm in diameter) as well as aneurysmal dilatation of the left common iliac    12/18/2016 - 12/31/2017 Chemotherapy    Restarted nivolamab '240mg'$  every 2 weeks, with tapering dose dexamethasone      04/18/2017 Imaging    PET Scan IMPRESSION: 1. No hypermetabolic lesion to suggest active malignancy. 2. Stable 8 mm lingular nodule is not currently hypermetabolic but merit surveillance. Infrarenal abdominal aortic aneurysm 3.1 cm in diameter. Recommend followup by [place] in 3 years. 3. Other imaging findings of potential clinical significance: Postoperative findings in the left frontal lobe. Right renal cyst. Aortic Atherosclerosis (ICD10-I70.0). Osteoarthritis. Lumbar scoliosis. Left knee effusion. Colonic diverticulosis.     10/10/2017 PET scan    IMPRESSION: 1. Small focus of FDG accumulation in the subcutaneous fat of the right gluteal region, nonspecific. Attention on follow-up suggested. 2. Otherwise stable exam without new or progressive hypermetabolic disease in the interval since prior PET-CT. 3. Stable 8 mm nodule in the lingula. No evidence for hypermetabolism on PET imaging. 4.  Aortic Atherosclerois (ICD10-170.0)     01/29/2018 PET scan    IMPRESSION: 1. Small focus of hypermetabolism in the lower left paratracheal neck, which appears to localize to the posterior left thyroid lobe, without discrete thyroid nodule on the CT images, decreased in metabolism since 10/10/2017 PET-CT. This finding is nonspecific and may represent a hypermetabolic thyroid nodule. Consider thyroid ultrasound correlation. 2. No new hypermetabolic findings suspicious for metastatic disease. Previously described low level metabolism in the subcutaneous right gluteal  region has resolved, compatible with resolved inflammatory focus. No evidence of recurrent hypermetabolism within the stable lingular pulmonary nodule. No evidence of recurrent osseous hypermetabolism. 3. Stable infrarenal 3.4 cm Abdominal Aortic Aneurysm (ICD10-I71.9). Recommend follow-up aortic ultrasound in 3 years. This recommendation follows ACR consensus guidelines: White Paper of the ACR Incidental Findings Committee II on Vascular Findings. J Am Coll Radiol 2013; 78:938-101. 4.  Aortic Atherosclerosis (ICD10-I70.0).     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 He was found to have left frontal hemorrhage 5.7 cm x 4.5 cm with 6 mm left to right shift.During the workup for his stroke, a left upper lung mass was noticed on the CT scan. I was called to evaluate his lung mass.  Patient has aphasia, not able to communicate much. According to his daughter, Dr. [name], who is a OB GYN at [employer], he probably only smoked for very short period time during the college. He did have 3 skin melanoma, which was removed, last one was 2-3 years ago. No other cancer history.  CURRENT THERAPY:   -Monthly Xgeva starting 02/2016, changed to every 3 months on 01/30/2018 -Restarted nivolamab on 12/18/2016 and changed to every 4 weeks on 01/30/2017, changed back to every 2 weeks due to fatigue on 04/23/17. Last treatment was on 12/31/2017   INTERVAL HISTORY:   David [name]. returns for follow-up and  ongoing treatment. He has seen NP Lacie in the interim, and was noted to have been recommended stopping testosterone due to enlarged prostate.  Today, he is here with a family member. He states that he is doing well and denies thyroid symptoms. He states that he is not as active as he was before, but rides a bicycle every once in a while. He feels that his LL are weaker,  even though he still walks.  He is eating well and denies abdominal bloating or discomfort.    REVIEW OF SYSTEMS:   Constitutional: Denies fevers, chills or abnormal weight loss  Eyes: Denies blurriness of vision  Ears, nose, mouth, throat, and face: Denies mucositis or sore throat Respiratory: Denies cough, dyspnea or wheezes Cardiovascular: Denies palpitation, chest discomfort or lower extremity swelling Gastrointestinal:  Denies nausea, heartburn, hematochezia  Skin: Negative. Lymphatics: Denies new lymphadenopathy or easy bruising Neurological: (+) weakness in LLs  MSK: (+) Arthritis pain, chronic Behavioral/Psych: Mood is stable, no new changes (+) low motivation All other systems were reviewed with the patient and are negative.  MEDICAL HISTORY:  Past Medical History:  Diagnosis Date  . Anginal pain ([place])   . Arthritis    mostly hands  . Benign familial hematuria   . BPH (benign prostatic hypertrophy)   . Brain cancer ([place])    melanoma with brain met  . Coronary artery disease   . GERD (gastroesophageal reflux disease)   . High cholesterol   . Hypertension   . Hypothyroidism   . Intracerebral hemorrhage ([place])   . Melanoma ([place])   . Metastatic melanoma to lung ([place])   . Mood disorder ([place])   . Myocardial infarction ([place])   . Pacemaker    due to syncope, 3rd degree HB (upgrade to [facility]. Jude CRT-P 02/25/13 (Dr. Uvaldo Rising)  . Rectal bleed    due to NSAIDS  . Renal disorder   . Skin cancer    melanoma  . Sleep apnea    uses CPAP  . Stroke [facility])     SURGICAL HISTORY: Past Surgical History:  Procedure Laterality Date  . APPLICATION OF CRANIAL NAVIGATION N/A 10/15/2015   Procedure: APPLICATION OF CRANIAL NAVIGATION;  Surgeon: Erline Levine, MD;  Location: [place];  Service: Neurosurgery;  Laterality: N/A;  . CARDIAC CATHETERIZATION  2013  . CARDIAC SURGERY     bypass X 2  . COLONOSCOPY WITH PROPOFOL N/A 05/08/2016   diverticulosis, int hem, no f/u needed [name])  .  CORONARY ARTERY BYPASS GRAFT  2013   LIMA-LAD, SVG-PDA 03/27/11 (Dr. Francee Gentile)  . CRANIOTOMY N/A 10/15/2015   Procedure: LEFT FRONTAL CRANIOTOMY TUMOR EXCISION with Curve;  Surgeon: Erline Levine, MD;  Location: [facility]UR[facility] ORS;  Service: Neurosurgery;  Laterality: N/A;  CRANIOTOMY TUMOR EXCISION  . EYE SURGERY    . FOOT SURGERY  08/2010  . JOINT REPLACEMENT Left    partial knee  . KNEE ARTHROSCOPY  12/2008  . LYMPH NODE BIOPSY    . PACEMAKER INSERTION    . TONSILLECTOMY      I have reviewed the social history and family history with the patient and they are unchanged from previous note.  ALLERGIES:  is allergic to ezetimibe and simvastatin.  MEDICATIONS:  Current Outpatient Medications  Medication Sig Dispense Refill  . acetaminophen (TYLENOL) 500 MG tablet Take 500 mg by mouth at bedtime.    Marland Kitchen amLODipine (NORVASC) 5 MG tablet TAKE ONE TABLET BY MOUTH EVERY DAY 32 tablet 11  . aspirin EC  81 MG tablet Take 81 mg by mouth daily.    . Cholecalciferol (VITAMIN D3) 1000 units CAPS Take 1 capsule (1,000 Units total) by mouth daily. 30 capsule   . fluocinonide cream (LIDEX) 0.05 % Apply topically 2 (two) times daily as needed. 30 g 1  . levothyroxine (SYNTHROID, LEVOTHROID) 137 MCG tablet TAKE 1 TABLET BY MOUTH ONCE DAILY BEFORE BREAKFAST 30 tablet 11  . metoprolol succinate (TOPROL-XL) 50 MG 24 hr tablet TAKE 1 AND 1/2 TABLET (75 MG) BY MOUTH EVERY DAY WITH OR IMMEDIATELY FOLLOWING A MEAL *DO NOT CRUSH* 50 tablet 11  . pentoxifylline (TRENTAL) 400 MG CR tablet Take 1 tablet (400 mg total) by mouth 2 (two) times daily. 60 tablet 6  . rosuvastatin (CRESTOR) 20 MG tablet TAKE ONE TABLET BY MOUTH AT BEDTIME 30 tablet 8  . sertraline (ZOLOFT) 100 MG tablet Take 1 tablet (100 mg total) by mouth daily.    . Turmeric 500 MG CAPS Take 2 capsules by mouth daily.    . vitamin E (VITAMIN E) 400 UNIT capsule Take 1 capsule (400 Units total) by mouth 2 (two) times daily. 60 capsule 6  . testosterone cypionate  (DEPOTESTOTERONE CYPIONATE) 100 MG/ML injection Inject 1 mL (100 mg total) into the muscle every 14 (fourteen) days. (Patient not taking: Reported on 01/30/2018) 10 mL 0   No current facility-administered medications for this visit.    Facility-Administered Medications Ordered in Other Visits  Medication Dose Route Frequency Provider Last Rate Last Dose  . 0.9 %  sodium chloride infusion  1,000 mL Intravenous Once [name], MD      . denosumab (XGEVA) injection 120 mg  120 mg Subcutaneous Once [name], MD        PHYSICAL EXAMINATION:  ECOG PERFORMANCE STATUS: 2 BP 109/80 (BP Location: Left Arm, Patient Position: Sitting)   Pulse 80   Temp (!) 97.5 F (36.4 C) (Oral)   Resp 18   Ht 5' 9.5" (1.765 m)   Wt 201 lb 14.4 oz (91.6 kg)   SpO2 93%   BMI 29.39 kg/m   GENERAL:alert, no distress and comfortable SKIN: Significant skin thickening below his right big toe with mild ecchymosis. Other skin color, texture, turgor are normal.  EYES: normal, right conjunctiva are pink and non-injected, sclera clear OROPHARYNX:no exudate, no erythema and lips, buccal mucosa, and tongue normal  NECK: supple, thyroid normal size, non-tender, without nodularity LYMPH:  no palpable lymphadenopathy in the cervical, [place], axillary or inguinal LUNGS: clear to auscultation and percussion with normal breathing effort HEART: regular rate & rhythm and no murmurs and no lower extremity edema ABDOMEN:abdomen soft, non-tender and normal bowel sounds Musculoskeletal:no cyanosis of digits and no clubbing  PSYCH: (+) low motivation   LABORATORY DATA:  I have reviewed the data as listed CBC Latest Ref Rng & Units 01/30/2018 12/31/2017 12/17/2017  WBC 4.0 - 10.5 K/uL 4.4 4.2 4.5  Hemoglobin 13.0 - 17.0 g/dL 14.3 15.2 15.8  Hematocrit 39.0 - 52.0 % 42.4 45.3 46.9  Platelets 150 - 400 K/uL 143(L) 157 175     CMP Latest Ref Rng & Units 01/30/2018 12/31/2017 12/17/2017  Glucose 70 - 99 mg/dL 161(H) 175(H) 137(H)  BUN 8  - 23 mg/dL 34(H) 30(H) 21  Creatinine 0.61 - 1.24 mg/dL 1.70(H) 1.43(H) 1.31(H)  Sodium 135 - 145 mmol/L 141 142 139  Potassium 3.5 - 5.1 mmol/L 4.8 4.5 5.0  Chloride 98 - 111 mmol/L 102 109 106  CO2 22 - 32 mmol/L  $'28 22 24  'B$ Calcium 8.9 - 10.3 mg/dL 10.3 8.8(L) 9.6  Total Protein 6.5 - 8.1 g/dL 7.0 7.2 7.0  Total Bilirubin 0.3 - 1.2 mg/dL 0.6 0.5 0.5  Alkaline Phos 38 - 126 U/L 59 62 59  AST 15 - 41 U/L '22 19 27  '$ ALT 0 - 44 U/L '19 15 25     '$ PATHOLOGY REPORT:  Diagnosis 07/16/17  Skin , left medial knee SPONGIOTIC PSORIASIFORM DERMATITIS, SEE DESCRIPTION Microscopic Comment There is a spongiotic and psoriasiform tissue reaction involving the epidermis with an underlying mononuclear cell infiltrate in which eosinophils are not conspicuous. This pattern can be seen in a chronic eczematous reaction such as a chronic contact dermatitis, id reaction, or nummular eczema. A special stain (PAS-F) is performed to determine the presence or absence of fungal hyphae in this biopsy. The PAS stain is negative for fungal hyphae, and the controls stained appropriately. The slides were also reviewed by Dr. Andria Frames, who is in general agreement with the diagnosis.   Diagnosis 09/21/2015 Lymph node, needle/core biopsy, hypermetabolic left sub clavicular LN - METASTATIC MALIGNANT MELANOMA. - SEE COMMENT.  Diagnosis 10/15/2015 Brain, for tumor resection, Left frontal - METASTATIC MELANOMA.     PROCEDURES: Colonoscopy 05/08/16 - Stool in the ascending colon. Fluid aspiration performed. - Diverticulosis in the entire examined colon. - Non-bleeding internal hemorrhoids. - The examination was otherwise normal on direct and retroflexion views.  RADIOGRAPHIC STUDIES: I have personally reviewed the radiological images as listed and agreed with the findings in the report. Nm Pet Image Restage (ps) Whole Body  Result Date: 01/30/2018 CLINICAL DATA:  Subsequent treatment strategy for metastatic melanoma  diagnosed June 2017. Completed immunotherapy. History of left frontal brain metastasis excision 10/15/2015 with brain radiation therapy completed 01/27/2016. EXAM: NUCLEAR MEDICINE PET WHOLE BODY TECHNIQUE: 10.2 mCi F-18 FDG was injected intravenously. Full-ring PET imaging was performed from the skull base to thigh after the radiotracer. CT data was obtained and used for attenuation correction and anatomic localization. Fasting blood glucose: 85 mg/dl COMPARISON:  10/10/2017 PET-CT. FINDINGS: Mediastinal blood pool activity: SUV max 3.0 HEAD/NECK: No hypermetabolic activity in the scalp. No hypermetabolic cervical lymph nodes. Stable photopenic defect at the area of encephalomalacia in the left frontal lobe at the site of prior brain metastasis resection. There is a focus of hypermetabolism in the left paratracheal neck with max SUV 4.3, which appears to correlate to the posterior left thyroid lobe, without discrete thyroid nodule on the CT images, with decreased hypermetabolism with previous max SUV 6.1 on 10/10/2017 PET-CT. Incidental CT findings: Stable postsurgical changes from left frontal craniotomy with ex vacuo dilatation of anterior horn of the left lateral ventricle. CHEST: No hypermetabolic axillary, mediastinal or hilar lymph nodes. No hypermetabolic pulmonary findings. Incidental CT findings: Solid 0.8 cm lingular pulmonary nodule (series 8/image 47) is stable and demonstrates no significant metabolism. No acute consolidative airspace disease, lung masses or new significant pulmonary nodules. Stable configuration of 3 lead left subclavian pacemaker with lead tips in the right atrium, right ventricular apex and coronary sinus. Coronary atherosclerosis status post CABG. Intact sternotomy wires. Atherosclerotic nonaneurysmal thoracic aorta. ABDOMEN/PELVIS: No abnormal hypermetabolic activity within the liver, pancreas, adrenal glands, or spleen. No hypermetabolic lymph nodes in the abdomen or pelvis.  Previously described focus of low level subcutaneous metabolism in the right gluteal region has resolved with no mass in this location on the CT images, most compatible with resolved inflammatory focus. Incidental CT findings: Atherosclerotic abdominal aorta with stable 3.4 cm  infrarenal abdominal aortic aneurysm. Marked diffuse colonic diverticulosis. Mildly enlarged prostate. SKELETON: No focal hypermetabolic activity to suggest skeletal metastasis. Incidental CT findings: Medial left knee compartment hemiarthroplasty. EXTREMITIES: No abnormal hypermetabolic activity in the lower extremities. Incidental CT findings: none IMPRESSION: 1. Small focus of hypermetabolism in the lower left paratracheal neck, which appears to localize to the posterior left thyroid lobe, without discrete thyroid nodule on the CT images, decreased in metabolism since 10/10/2017 PET-CT. This finding is nonspecific and may represent a hypermetabolic thyroid nodule. Consider thyroid ultrasound correlation. 2. No new hypermetabolic findings suspicious for metastatic disease. Previously described low level metabolism in the subcutaneous right gluteal region has resolved, compatible with resolved inflammatory focus. No evidence of recurrent hypermetabolism within the stable lingular pulmonary nodule. No evidence of recurrent osseous hypermetabolism. 3. Stable infrarenal 3.4 cm Abdominal Aortic Aneurysm (ICD10-I71.9). Recommend follow-up aortic ultrasound in 3 years. This recommendation follows ACR consensus guidelines: White Paper of the ACR Incidental Findings Committee II on Vascular Findings. J Am Coll Radiol 2013; 47:096-283. 4.  Aortic Atherosclerosis (ICD10-I70.0). Electronically Signed   By: Ilona Sorrel M.D.   On: 01/30/2018 10:21     01/29/2018 PET Scan IMPRESSION: 1. Small focus of hypermetabolism in the lower left paratracheal neck, which appears to localize to the posterior left thyroid lobe, without discrete thyroid nodule on  the CT images, decreased in metabolism since 10/10/2017 PET-CT. This finding is nonspecific and may represent a hypermetabolic thyroid nodule. Consider thyroid ultrasound correlation. 2. No new hypermetabolic findings suspicious for metastatic disease. Previously described low level metabolism in the subcutaneous right gluteal region has resolved, compatible with resolved inflammatory focus. No evidence of recurrent hypermetabolism within the stable lingular pulmonary nodule. No evidence of recurrent osseous hypermetabolism. 3. Stable infrarenal 3.4 cm Abdominal Aortic Aneurysm (ICD10-I71.9). Recommend follow-up aortic ultrasound in 3 years. This recommendation follows ACR consensus guidelines: White Paper of the ACR Incidental Findings Committee II on Vascular Findings. J Am Coll Radiol 2013; 66:294-765. 4.  Aortic Atherosclerosis (ICD10-I70.0).   PET 10/10/17 IMPRESSION: 1. Small focus of FDG accumulation in the subcutaneous fat of the right gluteal region, nonspecific. Attention on follow-up suggested. 2. Otherwise stable exam without new or progressive hypermetabolic disease in the interval since prior PET-CT. 3. Stable 8 mm nodule in the lingula. No evidence for hypermetabolism on PET imaging. 4.  Aortic Atherosclerois (ICD10-170.0)  CT Head with and without contrast 11/27/2016 IMPRESSION: 1. Continued evolution of post radiation changes in the left frontal lobe with further decrease in the area of contrast enhancement and adjacent edema. 2. No new contrast-enhancing lesions.  PET 10/18/16 IMPRESSION: 1. New focal hypermetabolism in the proximal sigmoid colon with associated focal colonic wall thickening and mild pericolonic fat stranding at the site of a proximal sigmoid diverticulum, favored to represent mild acute sigmoid diverticulitis. Metastatic disease is considered less likely. Clinical correlation is necessary. Consider appropriate antibiotic therapy, as clinically  warranted. Attention to this region recommended on future PET-CT follow-up. 2. Otherwise complete metabolic response with no evidence of hypermetabolic metastatic disease. Lingular pulmonary nodule is slightly decreased in size and demonstrates no significant metabolism. 3. No residual hypermetabolism in the right thyroid lobe nodule, which is stable in size. 4. Aortic Atherosclerosis (ICD10-I70.0). Stable 3.1 cm infrarenal abdominal aortic aneurysm. Aortic aneurysm NOS (ICD10-I71.9). Recommend followup by ultrasound in 3 years. This recommendation follows ACR consensus guidelines: White Paper of the ACR Incidental Findings Committee II on Vascular Findings. J Am Coll Radiol 2013;   CT Head W and WO  Contrast 10/05/16 IMPRESSION: 1. Decreased size of the enhancing area within the left frontal lobe treatment site. Surrounding edema has also decreased. The findings support continued evolution of radiation necrosis. 2. No new enhancing lesions.  CT Head 09/02/16 IMPRESSION: Somewhat mixed pattern of slight posterior progression of pleomorphic enhancement surrounding the LEFT lateral ventricle although no significant mass effect, and reduced vasogenic edema compared with the scan in April.  CT Head W WO Contrast 08-19-2016 IMPRESSION: Pronounced change in the left frontal region. Much more regional vasogenic edema. Relatively stable appearance of the abnormal enhancement extending from the lateral margin of the frontal horn of left lateral ventricle to the frontal brain surface. New region of abnormal enhancement surrounding the frontal horn of the left lateral ventricle measuring approximately 3.2 cm. The differential diagnosis is recurrent tumor versus radiation necrosis. The patient is reportedly not an MRI candidate. This area is large enough that CT perfusion could possibly make a reasonable differentiation.  PET Image Restage 05/29/16 IMPRESSION: 1. Stable exam with no evidence  progression. 2. Photopenia in the LEFT frontal lobe at prior surgical site. 3. Stable mildly hypermetabolic RIGHT paratracheal node. This may represent a thyroid nodule. 4. Stable size of minimally metabolic lingular pulmonary nodule. 5. Stable skeletal lesion of the RIGHT iliac crest 6. No cutaneous metabolic lesion.   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   1. Metastatic melanoma to lung, node, bone and brain, stage IV, BRAF V600K mutation (+), NED now  -I previously reviewed his PET/CT scan images with pt and his family members. The scan revealed hypermetabolic left upper lobe lung mass, 2 small right lung nodules, probable bone metastasis, and a subcutaneous left supraclavicular lesion, which is not palpable  -His repeat CT had showed a 2.0 cm left frontal hyperdense lesion with enhancement on contrast, in the area of his recent hemorrhagic stroke, highly suspicious for brain metastasis.  -We previously discussed his left supraclavicular node biopsy results, which showed metastatic melanoma.. -His case was discussed in our sarcoma tumor board. -I previously recommended a first-line treatment with Nivolamab alone, given his recent stroke, low tumor burden. I'll reserve BRAF inhibitor with or without MEK inhibitor as a second line therapy if he progress on immunotherapy. Other options of immunotherapy, especially checkpoint inhibitor with ipilumimab, were also discussed with pt and his daughter -He is on first line therapy with Nivolumab, has been tolerating Nivolumab well, no significant side effects except fatigue. It was held from 09/06/16 to 12/18/2016 due to his chronic steroids use. -He has had near complete metabolic response on the recent restaging PET scan in July 2018. 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 -PET can 12/13/16 shows no evidence of disease. He has achieved a radiographic complete response.  -due to his NED on scan, I have stopped Nivolumab after 2 years of treatment in 12/2017.  -I again encouraged him to continue exercising regularly.  -I reviewed and discussed his PET scan from10/22/2019 with him and his daughter which showed NED except a thyroid nodule. I advised him to get a thyroid US to better visualize his thyroid nodule.  -He is clinically doing well, no other symptoms, except  low motivation, able to do self-care at home. Physical exam unremarkable.  There is no clinical concern for disease progression -Labs reviewed, CBC overall WNLS except for platelets 143K. CMP showed BG 161 BUN 34 Cr 1.70.  -F/u in 3 months  2. Bone metastasis  -He has been on Xgeva monthly for 2 years, restaging PET scan has showed no hypermetabolic bone metastasis -I will change his Xgeva junction to every 3 months, and may stop it after 1 year of therapy if no recurrent disease    3. History of hemorrhagic stroke and brain metastasis  -I previously encouraged him to continue physical therapy and occupational therapy. 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 However his clinical neurological function has deteriorated -I have previously discussed with our neuro-oncologist Dr. Mickeal Skinner, and both of [place] feel he is not a candidate for Avastin for his radionecrosis due to  the risk of bleeding -I reviewed his restaging CT had with our neuro-oncologist Dr. Mickeal Skinner, his radionecrosis has improved, no other new lesions. He was also seen by Dr. Mickeal Skinner who feels he is recovering well from neurological standpoint -I tapered his steroids until he stopped altogether by 01/29/17 -He will continue to f/u with Dr. Mickeal Skinner.  4. 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 Mickeal Skinner to discuss treatment options to improve his neurologic function, patient is very frustrated about this, which significantly impacting his quality of life. -continue PT/OT  -CT Head 11/27/2016 showed continued evolution of post radiation changes in the left frontal lobe with further decrease in the area of contrast enhancement and adjacent edema. No new contrast-enhancing lesions.  -He will continue to follow up with Dr. Mickeal Skinner.   5.  Hypercalcemia and CKD stage III  -Possibly related to his underlying malignancy (bone mets) and missing one dose of Xgeva. He has restarted Xgeva monthly on 11/10/16.   -I previously advised him to eat less cheese and cut out milk and dairy products as possible -I previously encouraged him to drink water adequately and avoid NSAIDs  6. Fatigue, hypothyroidism and hypotestosteronemia -He has baseline hypothyroidism and hypotestosteronemia -His fatigue has much improved since he restarted testosterone injection by his primary care physician -His TSH level have been slightly fluctuating, possibly partially related to Nivolumab -He will follow-up with his primary care physician for his Synthroid dose adjustment    7. HTN   -he is on amlodipine and carvedilol, follow-up of his primary care physician -Due to  drug interaction with new oral chemo he will need to monitor his BP more often.  -Systolic BP had previously increased, so he was started on low dose Coreg. He has been dizzy lately.  -I previously advised him to continue to keep a BP log at home -better controlled lately BP at 109/80 today    8. Depression  -He is on Zoloft '100mg'$  daily, management by his PCP  -Due to his recent insomnia, he has reduced his Zoloft to 25 mg daily by  himself, he reports to be less motivated to do physical therapy after the change, I encouraged him to increase Zoloft to 50 mg daily, and consider melatonin at night for insomnia.   9. Goal of care discussion  -We previously discussed the incurable nature of his metastatic melanoma, and the overall poor prognosis, especially if he does not have good response to chemotherapy or progress on chemo -The patient understands the goal of care is palliative. -He is full code, recent PET showed NED   10. CAD -history of 2 double vessel bypass -He has Psychologist, forensic -per pt (his daughter) request, he has switched his cardiologist to Dr. Haroldine Laws    Plan -Restaging PET scan reviewed, no evidence of disease, except a thyroid nodule -I recommend a thyroid ultrasound for follow-up, he will discuss with his primary care physician Dr. Unk Lightning on his next visit  -Xgeva injection today, and will change to every 3 months -f/u in 3 months with lab and injection   All questions were answered. The patient knows to call the clinic with any problems, questions or concerns. No barriers to learning was detected.  I spent 20 minutes counseling the patient face to face. The total time spent in the appointment was 25 minutes and more than 50% was on counseling and review of test results.  Dierdre Searles Dweik am acting as scribe for Dr. [name].  I have reviewed the above documentation for accuracy and completeness, and I agree with the above.    [name], MD 01/30/18

## 2018-01-29 ENCOUNTER — Ambulatory Visit (HOSPITAL_COMMUNITY)
Admission: RE | Admit: 2018-01-29 | Discharge: 2018-01-29 | Disposition: A | Discharge status: Home / Self Care | Payer: Medicare Other | Source: Ambulatory Visit | Attending: Nurse Practitioner | Admitting: Nurse Practitioner

## 2018-01-29 DIAGNOSIS — C7931 Secondary malignant neoplasm of brain: Secondary | ICD-10-CM | POA: Insufficient documentation

## 2018-01-29 DIAGNOSIS — I714 Abdominal aortic aneurysm, without rupture: Secondary | ICD-10-CM | POA: Diagnosis not present

## 2018-01-29 DIAGNOSIS — G9389 Other specified disorders of brain: Secondary | ICD-10-CM | POA: Insufficient documentation

## 2018-01-29 DIAGNOSIS — R911 Solitary pulmonary nodule: Secondary | ICD-10-CM | POA: Insufficient documentation

## 2018-01-29 DIAGNOSIS — E041 Nontoxic single thyroid nodule: Secondary | ICD-10-CM | POA: Diagnosis not present

## 2018-01-29 DIAGNOSIS — I700 Atherosclerosis of aorta: Secondary | ICD-10-CM | POA: Insufficient documentation

## 2018-01-29 DIAGNOSIS — Z9889 Other specified postprocedural states: Secondary | ICD-10-CM | POA: Diagnosis not present

## 2018-01-29 DIAGNOSIS — C439 Malignant melanoma of skin, unspecified: Secondary | ICD-10-CM

## 2018-01-29 LAB — GLUCOSE, CAPILLARY: GLUCOSE-CAPILLARY: 85 mg/dL (ref 70–99)

## 2018-01-29 MED ORDER — FLUDEOXYGLUCOSE F - 18 (FDG) INJECTION
10.2000 | Freq: Once | INTRAVENOUS | Status: AC | PRN
  Administered 2018-01-29: 10.2 via INTRAVENOUS

## 2018-01-30 ENCOUNTER — Inpatient Hospital Stay (HOSPITAL_BASED_OUTPATIENT_CLINIC_OR_DEPARTMENT_OTHER): Payer: Medicare Other | Admitting: Hematology

## 2018-01-30 ENCOUNTER — Inpatient Hospital Stay: Payer: Medicare Other | Attending: Hematology

## 2018-01-30 ENCOUNTER — Inpatient Hospital Stay: Payer: Medicare Other

## 2018-01-30 ENCOUNTER — Encounter: Payer: Self-pay | Admitting: Hematology

## 2018-01-30 ENCOUNTER — Ambulatory Visit: Payer: Medicare Other | Admitting: Family Medicine

## 2018-01-30 VITALS — BP 109/80 | HR 80 | Temp 97.5°F | Resp 18 | Ht 69.5 in | Wt 201.9 lb

## 2018-01-30 DIAGNOSIS — C7800 Secondary malignant neoplasm of unspecified lung: Secondary | ICD-10-CM | POA: Insufficient documentation

## 2018-01-30 DIAGNOSIS — I10 Essential (primary) hypertension: Secondary | ICD-10-CM

## 2018-01-30 DIAGNOSIS — R5383 Other fatigue: Secondary | ICD-10-CM | POA: Diagnosis not present

## 2018-01-30 DIAGNOSIS — R4189 Other symptoms and signs involving cognitive functions and awareness: Secondary | ICD-10-CM

## 2018-01-30 DIAGNOSIS — I619 Nontraumatic intracerebral hemorrhage, unspecified: Secondary | ICD-10-CM

## 2018-01-30 DIAGNOSIS — C7951 Secondary malignant neoplasm of bone: Secondary | ICD-10-CM | POA: Insufficient documentation

## 2018-01-30 DIAGNOSIS — Z79899 Other long term (current) drug therapy: Secondary | ICD-10-CM | POA: Diagnosis not present

## 2018-01-30 DIAGNOSIS — C770 Secondary and unspecified malignant neoplasm of lymph nodes of head, face and neck: Secondary | ICD-10-CM | POA: Insufficient documentation

## 2018-01-30 DIAGNOSIS — I2510 Atherosclerotic heart disease of native coronary artery without angina pectoris: Secondary | ICD-10-CM

## 2018-01-30 DIAGNOSIS — C799 Secondary malignant neoplasm of unspecified site: Secondary | ICD-10-CM

## 2018-01-30 DIAGNOSIS — N183 Chronic kidney disease, stage 3 unspecified: Secondary | ICD-10-CM

## 2018-01-30 DIAGNOSIS — C7931 Secondary malignant neoplasm of brain: Secondary | ICD-10-CM

## 2018-01-30 DIAGNOSIS — E039 Hypothyroidism, unspecified: Secondary | ICD-10-CM | POA: Diagnosis not present

## 2018-01-30 DIAGNOSIS — C439 Malignant melanoma of skin, unspecified: Secondary | ICD-10-CM | POA: Diagnosis not present

## 2018-01-30 DIAGNOSIS — F329 Major depressive disorder, single episode, unspecified: Secondary | ICD-10-CM

## 2018-01-30 DIAGNOSIS — E291 Testicular hypofunction: Secondary | ICD-10-CM

## 2018-01-30 DIAGNOSIS — Z8673 Personal history of transient ischemic attack (TIA), and cerebral infarction without residual deficits: Secondary | ICD-10-CM

## 2018-01-30 DIAGNOSIS — I129 Hypertensive chronic kidney disease with stage 1 through stage 4 chronic kidney disease, or unspecified chronic kidney disease: Secondary | ICD-10-CM | POA: Diagnosis not present

## 2018-01-30 LAB — COMPREHENSIVE METABOLIC PANEL
ALK PHOS: 59 U/L (ref 38–126)
ALT: 19 U/L (ref 0–44)
AST: 22 U/L (ref 15–41)
Albumin: 3.7 g/dL (ref 3.5–5.0)
Anion gap: 11 (ref 5–15)
BUN: 34 mg/dL — ABNORMAL HIGH (ref 8–23)
CALCIUM: 10.3 mg/dL (ref 8.9–10.3)
CO2: 28 mmol/L (ref 22–32)
Chloride: 102 mmol/L (ref 98–111)
Creatinine, Ser: 1.7 mg/dL — ABNORMAL HIGH (ref 0.61–1.24)
GFR, EST AFRICAN AMERICAN: 43 mL/min — AB (ref >60)
GFR, EST NON AFRICAN AMERICAN: 37 mL/min — AB (ref >60)
Glucose, Bld: 161 mg/dL — ABNORMAL HIGH (ref 70–99)
Potassium: 4.8 mmol/L (ref 3.5–5.1)
SODIUM: 141 mmol/L (ref 135–145)
Total Bilirubin: 0.6 mg/dL (ref 0.3–1.2)
Total Protein: 7 g/dL (ref 6.5–8.1)

## 2018-01-30 LAB — CBC WITH DIFFERENTIAL/PLATELET
Abs Immature Granulocytes: 0.02 10*3/uL (ref 0.00–0.07)
BASOS PCT: 1 %
Basophils Absolute: 0 10*3/uL (ref 0.0–0.1)
Eosinophils Absolute: 0.4 10*3/uL (ref 0.0–0.5)
Eosinophils Relative: 9 %
HCT: 42.4 % (ref 39.0–52.0)
HEMOGLOBIN: 14.3 g/dL (ref 13.0–17.0)
Immature Granulocytes: 1 %
LYMPHS ABS: 0.6 10*3/uL — AB (ref 0.7–4.0)
Lymphocytes Relative: 12 %
MCH: 31 pg (ref 26.0–34.0)
MCHC: 33.7 g/dL (ref 30.0–36.0)
MCV: 92 fL (ref 80.0–100.0)
Monocytes Absolute: 0.3 10*3/uL (ref 0.1–1.0)
Monocytes Relative: 7 %
NRBC: 0 % (ref 0.0–0.2)
Neutro Abs: 3.1 10*3/uL (ref 1.7–7.7)
Neutrophils Relative %: 70 %
Platelets: 143 10*3/uL — ABNORMAL LOW (ref 150–400)
RBC: 4.61 MIL/uL (ref 4.22–5.81)
RDW: 12.5 % (ref 11.5–15.5)
WBC: 4.4 10*3/uL (ref 4.0–10.5)

## 2018-01-30 LAB — TSH: TSH: 2.141 u[IU]/mL (ref 0.320–4.118)

## 2018-01-30 MED ORDER — DENOSUMAB 120 MG/1.7ML ~~LOC~~ SOLN
120.0000 mg | Freq: Once | SUBCUTANEOUS | Status: AC
  Administered 2018-01-30: 120 mg via SUBCUTANEOUS

## 2018-01-30 MED ORDER — DENOSUMAB 120 MG/1.7ML ~~LOC~~ SOLN
SUBCUTANEOUS | Status: AC
  Filled 2018-01-30: qty 1.7

## 2018-01-30 NOTE — Progress Notes (Signed)
Per Dr.Feng, ok to give pt xgeva injection. Safir Michalec LPN

## 2018-02-01 ENCOUNTER — Telehealth: Payer: Self-pay | Admitting: Hematology

## 2018-02-01 NOTE — Telephone Encounter (Signed)
Appts scheduled patient notified per 10/23 los

## 2018-02-04 ENCOUNTER — Ambulatory Visit: Payer: PRIVATE HEALTH INSURANCE | Admitting: Podiatry

## 2018-02-06 ENCOUNTER — Telehealth: Payer: Self-pay | Admitting: Family Medicine

## 2018-02-06 NOTE — Telephone Encounter (Signed)
Received message from concerned daughter with increased depressive symptoms noted in father and PHQ9 =14.  Please call patient - I'd like to bring him into office in next 1-2 weeks for mood check.

## 2018-02-06 NOTE — Telephone Encounter (Signed)
Patient notified as instructed by telephone and verbalized understanding. Appointment scheduled 02/13/18 as instructed.

## 2018-02-13 ENCOUNTER — Other Ambulatory Visit: Payer: Self-pay | Admitting: Radiation Therapy

## 2018-02-13 ENCOUNTER — Encounter: Payer: Self-pay | Admitting: Family Medicine

## 2018-02-13 ENCOUNTER — Ambulatory Visit (INDEPENDENT_AMBULATORY_CARE_PROVIDER_SITE_OTHER): Payer: Medicare Other | Admitting: Family Medicine

## 2018-02-13 VITALS — BP 120/84 | HR 76 | Temp 97.8°F | Ht 69.5 in | Wt 202.5 lb

## 2018-02-13 DIAGNOSIS — I255 Ischemic cardiomyopathy: Secondary | ICD-10-CM

## 2018-02-13 DIAGNOSIS — N183 Chronic kidney disease, stage 3 unspecified: Secondary | ICD-10-CM

## 2018-02-13 DIAGNOSIS — N401 Enlarged prostate with lower urinary tract symptoms: Secondary | ICD-10-CM

## 2018-02-13 DIAGNOSIS — F331 Major depressive disorder, recurrent, moderate: Secondary | ICD-10-CM | POA: Diagnosis not present

## 2018-02-13 DIAGNOSIS — R972 Elevated prostate specific antigen [PSA]: Secondary | ICD-10-CM

## 2018-02-13 DIAGNOSIS — C439 Malignant melanoma of skin, unspecified: Secondary | ICD-10-CM

## 2018-02-13 DIAGNOSIS — E349 Endocrine disorder, unspecified: Secondary | ICD-10-CM

## 2018-02-13 MED ORDER — CITALOPRAM HYDROBROMIDE 20 MG PO TABS: 20.0000 mg | ORAL_TABLET | Freq: Every day | ORAL | 3 refills | Status: DC

## 2018-02-13 NOTE — Assessment & Plan Note (Addendum)
Testosterone has significant helped. Now off testosterone, notes worsening depression, anxiety, energy levels. Will change from sertraline 100mg  daily to celexa 20mg  daily to see if any improvement in effect. Pt agrees with plan.

## 2018-02-13 NOTE — Progress Notes (Signed)
BP 120/84 (BP Location: Left Arm, Patient Position: Sitting, Cuff Size: Normal)   Pulse 76   Temp 97.8 F (36.6 C) (Oral)   Ht 5' 9.5" (1.765 m)   Wt 202 lb 8 oz (91.9 kg)   SpO2 95%   BMI 29.48 kg/m    CC: depression f/u Subjective:    Patient ID: David Stall., male    DOB: Nov 09, 1940, 77 y.o.   MRN: [id number]  HPI: David [name]. is a 77 y.o. male presenting on 02/13/2018 for Depression (Here for mood chk.) and Discuss Medication (Wants to discuss testosterone. )   See prior note for details. Testosterone stopped 12/2017 due to concern over increasing PSA. Daughter brought up concern about worsening depression since testosterone stopped. He had decreased sertraline to 1/3 dosing for 2 weeks several months ago, then self tapered back up to 100mg  dose for the last few months.   Since off testosterone, notes ongoing trouble with motivation, depression, fatigue and lack of energy.  Insomnia several nights a week despite melatonin 10mg  nightly.  Notes incomplete emptying over the past 6 weeks since off testosterone.   Recent PHQ9 = 14 with daughter. Here in office, see below  Has increased vitamin D dosing on his own as well.   Lab Results  Component Value Date   PSA 5.60 (H) 12/21/2017   PSA 4.90 (H) 05/10/2017   PSA 3.90 08/30/2015    Relevant past medical, surgical, family and social history reviewed and updated as indicated. Interim medical history since our last visit reviewed. Allergies and medications reviewed and updated. Outpatient Medications Prior to Visit  Medication Sig Dispense Refill  . acetaminophen (TYLENOL) 500 MG tablet Take 500 mg by mouth at bedtime.    Marland Kitchen amLODipine (NORVASC) 5 MG tablet TAKE ONE TABLET BY MOUTH EVERY DAY 32 tablet 11  . aspirin EC 81 MG tablet Take 81 mg by mouth daily.    . Cholecalciferol (VITAMIN D3) 1000 units CAPS Take 1 capsule (1,000 Units total) by mouth daily. 30 capsule   . fluocinonide cream (LIDEX) 0.05 %  Apply topically 2 (two) times daily as needed. 30 g 1  . levothyroxine (SYNTHROID, LEVOTHROID) 137 MCG tablet TAKE 1 TABLET BY MOUTH ONCE DAILY BEFORE BREAKFAST 30 tablet 11  . metoprolol succinate (TOPROL-XL) 50 MG 24 hr tablet TAKE 1 AND 1/2 TABLET (75 MG) BY MOUTH EVERY DAY WITH OR IMMEDIATELY FOLLOWING A MEAL *DO NOT CRUSH* 50 tablet 11  . pentoxifylline (TRENTAL) 400 MG CR tablet Take 1 tablet (400 mg total) by mouth 2 (two) times daily. 60 tablet 6  . rosuvastatin (CRESTOR) 20 MG tablet TAKE ONE TABLET BY MOUTH AT BEDTIME 30 tablet 8  . sertraline (ZOLOFT) 100 MG tablet Take 1 tablet (100 mg total) by mouth daily.    . Turmeric 500 MG CAPS Take 2 capsules by mouth daily.    . vitamin E (VITAMIN E) 400 UNIT capsule Take 1 capsule (400 Units total) by mouth 2 (two) times daily. 60 capsule 6  . testosterone cypionate (DEPOTESTOTERONE CYPIONATE) 100 MG/ML injection Inject 1 mL (100 mg total) into the muscle every 14 (fourteen) days. (Patient not taking: Reported on 01/30/2018) 10 mL 0   Facility-Administered Medications Prior to Visit  Medication Dose Route Frequency Provider Last Rate Last Dose  . 0.9 %  sodium chloride infusion  1,000 mL Intravenous Once [name], MD         Per HPI unless specifically indicated in ROS  section below Review of Systems     Objective:    BP 120/84 (BP Location: Left Arm, Patient Position: Sitting, Cuff Size: Normal)   Pulse 76   Temp 97.8 F (36.6 C) (Oral)   Ht 5' 9.5" (1.765 m)   Wt 202 lb 8 oz (91.9 kg)   SpO2 95%   BMI 29.48 kg/m   Wt Readings from Last 3 Encounters:  02/13/18 202 lb 8 oz (91.9 kg)  01/30/18 201 lb 14.4 oz (91.6 kg)  12/31/17 203 lb 11.2 oz (92.4 kg)    Physical Exam  Constitutional: He appears well-developed and well-nourished. No distress.  Psychiatric: He has a normal mood and affect. His behavior is normal.  Nursing note and vitals reviewed.  Results for orders placed or performed in visit on 01/30/18  CBC with  Differential  Result Value Ref Range   WBC 4.4 4.0 - 10.5 K/uL   RBC 4.61 4.22 - 5.81 MIL/uL   Hemoglobin 14.3 13.0 - 17.0 g/dL   HCT 42.4 39.0 - 52.0 %   MCV 92.0 80.0 - 100.0 fL   MCH 31.0 26.0 - 34.0 pg   MCHC 33.7 30.0 - 36.0 g/dL   RDW 12.5 11.5 - 15.5 %   Platelets 143 (L) 150 - 400 K/uL   nRBC 0.0 0.0 - 0.2 %   Neutrophils Relative % 70 %   Neutro Abs 3.1 1.7 - 7.7 K/uL   Lymphocytes Relative 12 %   Lymphs Abs 0.6 (L) 0.7 - 4.0 K/uL   Monocytes Relative 7 %   Monocytes Absolute 0.3 0.1 - 1.0 K/uL   Eosinophils Relative 9 %   Eosinophils Absolute 0.4 0.0 - 0.5 K/uL   Basophils Relative 1 %   Basophils Absolute 0.0 0.0 - 0.1 K/uL   Immature Granulocytes 1 %   Abs Immature Granulocytes 0.02 0.00 - 0.07 K/uL  Comprehensive metabolic panel  Result Value Ref Range   Sodium 141 135 - 145 mmol/L   Potassium 4.8 3.5 - 5.1 mmol/L   Chloride 102 98 - 111 mmol/L   CO2 28 22 - 32 mmol/L   Glucose, Bld 161 (H) 70 - 99 mg/dL   BUN 34 (H) 8 - 23 mg/dL   Creatinine, Ser 1.70 (H) 0.61 - 1.24 mg/dL   Calcium 10.3 8.9 - 10.3 mg/dL   Total Protein 7.0 6.5 - 8.1 g/dL   Albumin 3.7 3.5 - 5.0 g/dL   AST 22 15 - 41 U/L   ALT 19 0 - 44 U/L   Alkaline Phosphatase 59 38 - 126 U/L   Total Bilirubin 0.6 0.3 - 1.2 mg/dL   GFR calc non Af Amer 37 (L) >60 mL/min   GFR calc Af Amer 43 (L) >60 mL/min   Anion gap 11 5 - 15  TSH  Result Value Ref Range   TSH 2.141 0.320 - 4.118 uIU/mL      Assessment & Plan:   Problem List Items Addressed This Visit    MDD (major depressive disorder), recurrent episode, moderate (HCC)    Testosterone has significant helped. Now off testosterone, notes worsening depression, anxiety, energy levels. Will change from sertraline 100mg  daily to celexa 20mg  daily to see if any improvement in effect. Pt agrees with plan.       Relevant Medications   citalopram (CELEXA) 20 MG tablet   Malignant melanoma ([place])    Known metastatic melanoma, under treatment by  oncology with latest PET 01/2018 not showing active disease.  Hypotestosteronemia - Primary    Longstanding IM testosterone cypionate replacement at 100mg  Q2 wks, recently stopped due to elevated PSA levels - and concern that T could be fueling small prostate cancer vs BPH.  However, he feels miserable off testosterone replacement. He strongly desires to return to IM replacement. Will check testosterone levels off replacement as well as trend PSA values. Discussed possible urology referral for opinion.  Known metastatic melanoma under active treatment complicates the issue.       Relevant Orders   Testos,Total,Free and SHBG (Male)   Elevated PSA    Increasing trend on testosterone, now off this will recheck PSA. We have discussed possible causes of elevated PSA including BPH and prostate cancer. Will likely ultimately refer to urology for their recommendations for this complex patient.       Relevant Orders   PSA, Total with Reflex to PSA, Free   Chronic kidney disease, stage III (moderate) (HCC)   Benign prostatic hyperplasia       Meds ordered this encounter  Medications  . citalopram (CELEXA) 20 MG tablet    Sig: Take 1 tablet (20 mg total) by mouth daily.    Dispense:  30 tablet    Refill:  3    In place of sertraline   Orders Placed This Encounter  Procedures  . PSA, Total with Reflex to PSA, Free    Standing Status:   Future    Standing Expiration Date:   02/14/2019  . Testos,Total,Free and SHBG (Male)    Standing Status:   Future    Standing Expiration Date:   02/14/2019    Follow up plan: No follow-ups on file.  [name], MD

## 2018-02-13 NOTE — Assessment & Plan Note (Signed)
Known metastatic melanoma, under treatment by oncology with latest PET 01/2018 not showing active disease.

## 2018-02-13 NOTE — Assessment & Plan Note (Addendum)
Increasing trend on testosterone, now off this will recheck PSA. We have discussed possible causes of elevated PSA including BPH and prostate cancer. Will likely ultimately refer to urology for their recommendations for this complex patient.

## 2018-02-13 NOTE — Patient Instructions (Addendum)
Return Friday for labwork in the morning. We will be in touch with results.  Start celexa 20mg  daily in the morning in place of sertraline.

## 2018-02-13 NOTE — Assessment & Plan Note (Addendum)
Longstanding IM testosterone cypionate replacement at 100mg  Q2 wks, recently stopped due to elevated PSA levels - and concern that T could be fueling small prostate cancer vs BPH.  However, he feels miserable off testosterone replacement. He strongly desires to return to IM replacement. Will check testosterone levels off replacement as well as trend PSA values. Discussed possible urology referral for opinion.  Known metastatic melanoma under active treatment complicates the issue.

## 2018-02-14 ENCOUNTER — Ambulatory Visit (HOSPITAL_COMMUNITY)
Admission: RE | Admit: 2018-02-14 | Discharge: 2018-02-14 | Disposition: A | Discharge status: Home / Self Care | Payer: Medicare Other | Source: Ambulatory Visit | Attending: Internal Medicine | Admitting: Internal Medicine

## 2018-02-14 DIAGNOSIS — C801 Malignant (primary) neoplasm, unspecified: Secondary | ICD-10-CM | POA: Diagnosis not present

## 2018-02-14 DIAGNOSIS — C7931 Secondary malignant neoplasm of brain: Secondary | ICD-10-CM | POA: Diagnosis not present

## 2018-02-14 MED ORDER — IOHEXOL 300 MG/ML  SOLN
75.0000 mL | Freq: Once | INTRAMUSCULAR | Status: AC | PRN
  Administered 2018-02-14: 60 mL via INTRAVENOUS

## 2018-02-14 MED ORDER — SODIUM CHLORIDE (PF) 0.9 % IJ SOLN
INTRAMUSCULAR | Status: AC
  Filled 2018-02-14: qty 50

## 2018-02-15 ENCOUNTER — Other Ambulatory Visit (INDEPENDENT_AMBULATORY_CARE_PROVIDER_SITE_OTHER): Payer: Medicare Other

## 2018-02-15 ENCOUNTER — Ambulatory Visit: Payer: Medicare Other | Admitting: Family Medicine

## 2018-02-15 DIAGNOSIS — E349 Endocrine disorder, unspecified: Secondary | ICD-10-CM | POA: Diagnosis not present

## 2018-02-15 DIAGNOSIS — R972 Elevated prostate specific antigen [PSA]: Secondary | ICD-10-CM | POA: Diagnosis not present

## 2018-02-18 ENCOUNTER — Other Ambulatory Visit: Payer: Medicare Other

## 2018-02-18 ENCOUNTER — Ambulatory Visit: Payer: Medicare Other | Admitting: Internal Medicine

## 2018-02-20 LAB — PSA, TOTAL WITH REFLEX TO PSA, FREE: PSA, Total: 2.8 ng/mL (ref <=4.0)

## 2018-02-20 LAB — TESTOS,TOTAL,FREE AND SHBG (FEMALE)
Free Testosterone: 8.9 pg/mL — ABNORMAL LOW (ref 30.0–135.0)
SEX HORMONE BINDING: 37 nmol/L (ref 22–77)
Testosterone, Total, LC-MS-MS: 60 ng/dL — ABNORMAL LOW (ref 250–1100)

## 2018-02-21 NOTE — Progress Notes (Signed)
Brain and Spine Tumor Board Documentation  Dontay Harm. was presented by Cecil Cobbs, MD at Brain and Spine Tumor Board on 02/21/2018, which included representatives from neuro oncology, radiation oncology, surgical oncology.  Vashawn was presented as a current patient with history of the following treatments:  .  Additionally, we reviewed previous medical and familial history, history of present illness, and recent lab results along with all available histopathologic and imaging studies. The tumor board considered available treatment options and made the following recommendations:  Active surveillance    Tumor board is a meeting of clinicians from various specialty areas who evaluate and discuss patients for whom a multidisciplinary approach is being considered. Final determinations in the plan of care are those of the provider(s). The responsibility for follow up of recommendations given during tumor board is that of the provider.   Today's extended care, comprehensive team conference, Dmarion was not present for the discussion and was not examined.

## 2018-02-22 ENCOUNTER — Inpatient Hospital Stay: Payer: Medicare Other | Attending: Hematology | Admitting: Internal Medicine

## 2018-02-22 ENCOUNTER — Telehealth: Payer: Self-pay | Admitting: Family Medicine

## 2018-02-22 VITALS — BP 122/80 | HR 78 | Temp 97.4°F | Resp 18 | Ht 69.5 in | Wt 202.5 lb

## 2018-02-22 DIAGNOSIS — C7931 Secondary malignant neoplasm of brain: Secondary | ICD-10-CM | POA: Diagnosis not present

## 2018-02-22 DIAGNOSIS — C439 Malignant melanoma of skin, unspecified: Secondary | ICD-10-CM | POA: Diagnosis not present

## 2018-02-22 DIAGNOSIS — R4189 Other symptoms and signs involving cognitive functions and awareness: Secondary | ICD-10-CM | POA: Insufficient documentation

## 2018-02-22 DIAGNOSIS — I10 Essential (primary) hypertension: Secondary | ICD-10-CM | POA: Diagnosis not present

## 2018-02-22 NOTE — Telephone Encounter (Signed)
Left message asking pt to call office please r/s 12/2 appointment with dr copland Offer dr Paulla Fore or dr schmitz.  If pt wants to wait on dr copland   Schedule in Jette

## 2018-02-22 NOTE — Progress Notes (Signed)
[facility] [place] [place], [place] [address] [phone]   Interval Evaluation  Date of Service: 02/22/18 Patient Name: David [name]. Patient MRN: [id number] Patient DOB: 1940-12-05 Provider: Ventura Sellers, MD  Identifying Statement:  David [place]. is a 77 y.o. male with melanoma, brain metastases  Primary Cancer: Melanoma Stage IV  Oncologic History: Oncology History   Metastatic melanoma [facility])   Staging form: Melanoma of the Skin, AJCC 7th Edition     Clinical stage from 09/21/2015: Stage IV [place] [place] [place]) - Signed by [name], MD on 10/09/2015       Malignant melanoma ([place])   09/16/2015 Imaging    PET scan showed a hypermetabolic 2.1HY lingular nodule, and a 83m right upper lobe lung nodule, left subclavicular nodes, and bone metastasis in the proximal right humerus and right femur.     09/21/2015 Initial Diagnosis    Metastatic melanoma ([facility]    09/21/2015 Initial Biopsy    Left subclavicular lymph node biopsy showed prostatic of melanoma    09/21/2015 Miscellaneous    BRAF V600K mutation (+)     10/04/2015 Imaging    CT head with without contrast showed a 2.0 x 1.6 x 1.7 cm superficial left frontal hyperdense lesion, with postcontrast enhancement, lying within the previous hemorrhagic area, concerning for solitary melanoma metastasis    10/06/2015 - 09/06/2016 Chemotherapy    Nivolumab '240mg'$  every 2 weeks, held due to his dexamethasone for radionecrosis     10/13/2015 - 10/13/2015 Radiation Therapy    10/13/2015 Preop SRS treatment: 14 Gy  to the left frontal lesion in 1 fraction    10/15/2015 Surgery    left front craniotomy and tumor excision     10/15/2015 Pathology Results    left frontal brain mass excision showed metastatic melanoma, 1.5X1.5X0.6cm    01/13/2016 Imaging    CT head without and with contrast showed ill-defined enhancement and dural thickening of the left frontal resection cavity may  represent a post treatment changes or residual neoplasm. A new 6 mm hypodensity in the right cerebellar hemisphere with uncertainty enhancement may represent a metastasis or small brain parenchymal hemorrhage.     01/27/2016 - 01/27/2016 Radiation Therapy    01/27/16 SRS Treatment:  20 Gy in 1 fraction to a 6 mm right cerebellar brain met     03/29/2016 PET scan    PET 03/29/2016 IMPRESSION: 1. Overall there has been an interval response to therapy. The index lesions sided on previous exam it are less hypermetabolic when compared with the previous exam. 2. There is an intramuscular lesion within the right masseter which has increased FDG uptake compared with previous exam. 3. No new sites of disease identified. 4. Aortic atherosclerosis and infrarenal abdominal aortic aneurysm. Recommend followup by ultrasound in 3 years. This recommendation follows ACR consensus guidelines: White Paper of the ACR Incidental Findings Committee II on Vascular Findings. JJoellyn RuedRadiol 2013; 10:789-794    04/20/2016 Imaging    CT Head w wo contrast 1. Mild patchy enhancement at the left anterior frontal lobe treatment site (series 11, image 39) without mass effect and stable surrounding white matter hypodensity without strong evidence of acute vasogenic edema. Recommend continued CT surveillance. 2. No new metastatic disease or new intracranial abnormality identified. 3. Probable benign 14 mm left parietal bone lucent lesion, unchanged since May 2017.    05/06/2016 - 05/08/2016 Hospital Admission    Pt was admitted  for rectal bleeding for one day. Breathing spontaneously resolved, hemoglobin dropped from 15 to 10.5, did not require blood transfusion. Colonoscopy showed no active bleeding, except diverticulosis and hemorrhoid. He was discharged to home.    05/08/2016 Procedure    Colonoscopy  - Stool in the ascending colon. Fluid aspiration performed. - Diverticulosis in the entire examined colon. -  Non-bleeding internal hemorrhoids. - The examination was otherwise normal on direct and retroflexion views.    05/29/2016 Imaging    PET Image Restaging IMPRESSION: 1. Stable exam with no evidence progression. 2. Photopenia in the LEFT frontal lobe at prior surgical site. 3. Stable mildly hypermetabolic RIGHT paratracheal node. This may represent a thyroid nodule. 4. Stable size of minimally metabolic lingular pulmonary nodule. 5. Stable skeletal lesion of the RIGHT iliac crest 6. No cutaneous metabolic lesion.    07/20/2016 Imaging    CT Head W WO Contrast IMPRESSION: Pronounced change in the left frontal region. Much more regional vasogenic edema. Relatively stable appearance of the abnormal enhancement extending from the lateral margin of the frontal horn of left lateral ventricle to the frontal brain surface. New region of abnormal enhancement surrounding the frontal horn of the left lateral ventricle measuring approximately 3.2 cm. The differential diagnosis is recurrent tumor versus radiation necrosis. The patient is reportedly not an MRI candidate. This area is large enough that CT perfusion could possibly make a reasonable differentiation.    09/02/2016 Imaging    CT Head 09/02/16 IMPRESSION: Somewhat mixed pattern of slight posterior progression of pleomorphic enhancement surrounding the LEFT lateral ventricle although no significant mass effect, and reduced vasogenic edema compared with the scan in April.    09/27/2016 - 11/02/2016 Chemotherapy    Start dabrafenib and trametinib 09/27/16, stopped due to severe fatigue      10/05/2016 Imaging    CT Head w & w/o contrast  IMPRESSION: 1. Decreased size of the enhancing area within the left frontal lobe treatment site. Surrounding edema has also decreased. The findings support continued evolution of radiation necrosis. 2. No new enhancing lesions.    10/18/2016 PET scan    IMPRESSION: 1. New focal hypermetabolism in the  proximal sigmoid colon with associated focal colonic wall thickening and mild pericolonic fat stranding at the site of a proximal sigmoid diverticulum, favored to represent mild acute sigmoid diverticulitis. Metastatic disease is considered less likely. Clinical correlation is necessary. Consider appropriate antibiotic therapy, as clinically warranted. Attention to this region recommended on future PET-CT follow-up. 2. Otherwise complete metabolic response with no evidence of hypermetabolic metastatic disease. Lingular pulmonary nodule is slightly decreased in size and demonstrates no significant metabolism. 3. No residual hypermetabolism in the right thyroid lobe nodule, which is stable in size. 4. Aortic Atherosclerosis (ICD10-I70.0). Stable 3.1 cm infrarenal abdominal aortic aneurysm. Aortic aneurysm NOS (ICD10-I71.9). Recommend followup by ultrasound in 3 years. This recommendation follows ACR consensus guidelines: White Paper of the ACR Incidental Findings Committee II on Vascular Findings. Joellyn Rued Radiol 2013; 38:756-433.    11/01/2016 - 11/04/2016 Hospital Admission    Patient admitted to the hospital due to hypercalcemia where he received IV fluids and pamidronate    12/13/2016 PET scan    PET 12/13/16 IMPRESSION: 1. No findings to suggest metastatic disease on today's examination. 2. Previously noted hypermetabolism in the sigmoid colon has resolved, presumably indicative of a focus of diverticulitis on the prior study. Today's study does demonstrate relatively diffuse hypermetabolism throughout the cecum, ascending colon and proximal transverse colon where there is  some mild mural thickening, favored to reflect mild chronic inflammation related to underlying diverticular disease. No definite inflammatory changes on the CT portion of the examination to strongly suggest an active acute diverticulitis at this time. No discrete lesion to suggest neoplasm. 3. Previously noted 8  mm lingular nodule is stable in size and known remains non hypermetabolic. This is nonspecific but reassuring. Continued attention on future followup studies is recommended. 4. Aortic atherosclerosis, in addition to left main and 3 vessel coronary artery disease. Status post median sternotomy for CABG including LIMA to the LAD. In addition, there is mild fusiform aneurysmal dilatation of the infrarenal abdominal aorta (3.1 cm in diameter) as well as aneurysmal dilatation of the left common iliac    12/18/2016 - 12/31/2017 Chemotherapy    Restarted nivolamab 242m every 2 weeks, with tapering dose dexamethasone      04/18/2017 Imaging    PET Scan IMPRESSION: 1. No hypermetabolic lesion to suggest active malignancy. 2. Stable 8 mm lingular nodule is not currently hypermetabolic but merit surveillance. Infrarenal abdominal aortic aneurysm 3.1 cm in diameter. Recommend followup by [facility] 3 years. 3. Other imaging findings of potential clinical significance: Postoperative findings in the left frontal lobe. Right renal cyst. Aortic Atherosclerosis (ICD10-I70.0). Osteoarthritis. Lumbar scoliosis. Left knee effusion. Colonic diverticulosis.     10/10/2017 PET scan    IMPRESSION: 1. Small focus of FDG accumulation in the subcutaneous fat of the right gluteal region, nonspecific. Attention on follow-up suggested. 2. Otherwise stable exam without new or progressive hypermetabolic disease in the interval since prior PET-CT. 3. Stable 8 mm nodule in the lingula. No evidence for hypermetabolism on PET imaging. 4.  Aortic Atherosclerois (ICD10-170.0)     01/29/2018 PET scan    IMPRESSION: 1. Small focus of hypermetabolism in the lower left paratracheal neck, which appears to localize to the posterior left thyroid lobe, without discrete thyroid nodule on the CT images, decreased in metabolism since 10/10/2017 PET-CT. This finding is nonspecific and may represent a hypermetabolic thyroid nodule. Consider  thyroid ultrasound correlation. 2. No new hypermetabolic findings suspicious for metastatic disease. Previously described low level metabolism in the subcutaneous right gluteal region has resolved, compatible with resolved inflammatory focus. No evidence of recurrent hypermetabolism within the stable lingular pulmonary nodule. No evidence of recurrent osseous hypermetabolism. 3. Stable infrarenal 3.4 cm Abdominal Aortic Aneurysm (ICD10-I71.9). Recommend follow-up aortic ultrasound in 3 years. This recommendation follows ACR consensus guidelines: White Paper of the ACR Incidental Findings Committee II on Vascular Findings. J Am Coll Radiol 2013;; 66:063-016 4.  Aortic Atherosclerosis (ICD10-I70.0).     Interval History:  CKamron [name] presents today for follow up. He describes feeling sluggish, tired, and low energy.  His PCP checked labs and found his testosterone level to be very low.  He otherwise denies seizures, headaches, neck or back pain.  No new or progressive neurologic deficits.     Medications: Current Outpatient Medications on File Prior to Visit  Medication Sig Dispense Refill  . acetaminophen (TYLENOL) 500 MG tablet Take 500 mg by mouth at bedtime.    .Marland KitchenamLODipine (NORVASC) 5 MG tablet TAKE ONE TABLET BY MOUTH EVERY DAY 32 tablet 11  . aspirin EC 81 MG tablet Take 81 mg by mouth daily.    . Cholecalciferol (VITAMIN D3) 1000 units CAPS Take 1 capsule (1,000 Units total) by mouth daily. 30 capsule   . citalopram (CELEXA) 20 MG tablet Take 1 tablet (20 mg total) by mouth daily. 30 tablet 3  .  fluocinonide cream (LIDEX) 0.05 % Apply topically 2 (two) times daily as needed. 30 g 1  . levothyroxine (SYNTHROID, LEVOTHROID) 137 MCG tablet TAKE 1 TABLET BY MOUTH ONCE DAILY BEFORE BREAKFAST 30 tablet 11  . metoprolol succinate (TOPROL-XL) 50 MG 24 hr tablet TAKE 1 AND 1/2 TABLET (75 MG) BY MOUTH EVERY DAY WITH OR IMMEDIATELY FOLLOWING A MEAL *DO NOT CRUSH* 50 tablet 11  .  pentoxifylline (TRENTAL) 400 MG CR tablet Take 1 tablet (400 mg total) by mouth 2 (two) times daily. 60 tablet 6  . rosuvastatin (CRESTOR) 20 MG tablet TAKE ONE TABLET BY MOUTH AT BEDTIME 30 tablet 8  . sertraline (ZOLOFT) 100 MG tablet Take 1 tablet (100 mg total) by mouth daily.    Marland Kitchen testosterone cypionate (DEPOTESTOTERONE CYPIONATE) 100 MG/ML injection Inject 1 mL (100 mg total) into the muscle every 14 (fourteen) days. (Patient not taking: Reported on 01/30/2018) 10 mL 0  . Turmeric 500 MG CAPS Take 2 capsules by mouth daily.    . vitamin E (VITAMIN E) 400 UNIT capsule Take 1 capsule (400 Units total) by mouth 2 (two) times daily. 60 capsule 6   Current Facility-Administered Medications on File Prior to Visit  Medication Dose Route Frequency Provider Last Rate Last Dose  . 0.9 %  sodium chloride infusion  1,000 mL Intravenous Once [name], MD        Allergies:  Allergies  Allergen Reactions  . Ezetimibe   . Simvastatin Other (See Comments)    Reaction:  Gave pt a fever  Fever - temp of 103.   In 2003   Past Medical History:  Past Medical History:  Diagnosis Date  . Anginal pain ([place])   . Arthritis    mostly hands  . Benign familial hematuria   . BPH (benign prostatic hypertrophy)   . Brain cancer ([place])    melanoma with brain met  . Coronary artery disease   . GERD (gastroesophageal reflux disease)   . High cholesterol   . Hypertension   . Hypothyroidism   . Intracerebral hemorrhage ([place])   . Melanoma ([place])   . Metastatic melanoma to lung ([place])   . Mood disorder ([place])   . Myocardial infarction ([place])   . Pacemaker    due to syncope, 3rd degree HB (upgrade to [facility]. Jude CRT-P 02/25/13 (Dr. Uvaldo Rising)  . Rectal bleed    due to NSAIDS  . Renal disorder   . Skin cancer    melanoma  . Sleep apnea    uses CPAP  . Stroke [facility])    Past Surgical History:  Past Surgical History:  Procedure Laterality Date  . APPLICATION OF CRANIAL NAVIGATION N/A 10/15/2015   Procedure:  APPLICATION OF CRANIAL NAVIGATION;  Surgeon: Erline Levine, MD;  Location: [name] NEURO ORS;  Service: Neurosurgery;  Laterality: N/A;  . CARDIAC CATHETERIZATION  2013  . CARDIAC SURGERY     bypass X 2  . COLONOSCOPY WITH PROPOFOL N/A 05/08/2016   diverticulosis, int hem, no f/u needed [name])  . CORONARY ARTERY BYPASS GRAFT  2013   LIMA-LAD, SVG-PDA 03/27/11 (Dr. Francee Gentile)  . CRANIOTOMY N/A 10/15/2015   Procedure: LEFT FRONTAL CRANIOTOMY TUMOR EXCISION with Curve;  Surgeon: Erline Levine, MD;  Location: [place] NEURO ORS;  Service: Neurosurgery;  Laterality: N/A;  CRANIOTOMY TUMOR EXCISION  . EYE SURGERY    . FOOT SURGERY  08/2010  . JOINT REPLACEMENT Left    partial knee  . KNEE ARTHROSCOPY  12/2008  . LYMPH NODE  BIOPSY    . PACEMAKER INSERTION    . TONSILLECTOMY     Social History:  Social History   Socioeconomic History  . Marital status: Married    Spouse name: Not on file  . Number of children: 2  . Years of education: Not on file  . Highest education level: Not on file  Occupational History  . Occupation: retired Pharmacist, community  Social Needs  . Financial resource strain: Not on file  . Food insecurity:    Worry: Not on file    Inability: Not on file  . Transportation needs:    Medical: Not on file    Non-medical: Not on file  Tobacco Use  . Smoking status: Former Smoker    Packs/day: 1.00    Years: 3.00    Pack years: 3.00    Last attempt to quit: 04/10/1957    Years since quitting: 60.9  . Smokeless tobacco: Never Used  Substance and Sexual Activity  . Alcohol use: Yes    Alcohol/week: 14.0 standard drinks    Types: 10 Glasses of wine, 4 Cans of beer per week  . Drug use: No  . Sexual activity: Not Currently  Lifestyle  . Physical activity:    Days per week: Not on file    Minutes per session: Not on file  . Stress: Not on file  Relationships  . Social connections:    Talks on phone: Not on file    Gets together: Not on file    Attends religious service: Not on file     Active member of club or organization: Not on file    Attends meetings of clubs or organizations: Not on file    Relationship status: Not on file  . Intimate partner violence:    Fear of current or ex partner: Not on file    Emotionally abused: Not on file    Physically abused: Not on file    Forced sexual activity: Not on file  Other Topics Concern  . Not on file  Social History Narrative   Lives with wife [name]) with dementia in [facility]   Daughter [name] and son   Occ: retired Pharmacist, community practiced in [place]   Has living will   [name] is health care POA   Requests DNR-- done 09/14/15   Not sure about tube feeds   Family History:  Family History  Problem Relation Age of Onset  . Cancer Mother 15       lung   . Hypertension Mother   . Hypertension Father   . Heart attack Father   . Heart disease Father   . Rheum arthritis Sister   . Cancer Maternal Grandmother     Review of Systems: Constitutional: Denies fevers, chills or abnormal weight loss Eyes: Denies blurriness of vision Ears, nose, mouth, throat, and face: Denies mucositis or sore throat Respiratory: Denies cough, dyspnea or wheezes Cardiovascular: Denies palpitation, chest discomfort or lower extremity swelling Gastrointestinal:  Denies nausea, constipation, diarrhea GU: Denies dysuria or incontinence Skin: Denies abnormal skin rashes Neurological: Per HPI Musculoskeletal: Denies joint pain, back or neck discomfort. No decrease in ROM Behavioral/Psych: Denies anxiety, disturbance in thought content, and mood instability   Physical Exam: Vitals:   02/22/18 1101  BP: 122/80  Pulse: 78  Resp: 18  Temp: (!) 97.4 F (36.3 C)  SpO2: 97%   KPS: 70. General: Alert, cooperative, pleasant, in no acute distress Head: Craniotomy scar noted, dry and intact.  EENT: No conjunctival injection or scleral icterus. Oral mucosa moist Lungs: Resp effort normal Cardiac: Regular rate and  rhythm Abdomen: Soft, non-distended abdomen Skin: No rashes cyanosis or petechiae. Extremities: No clubbing or edema  Neurologic Exam: Mental Status: Awake, alert, attentive to examiner. Oriented to self and environment. Language is fluent with intact comprehension.  Age advanced psychomotor slowing.  Limited insight at times.  Cranial Nerves: Visual acuity is grossly normal. Visual fields are full. Extra-ocular movements intact. No ptosis. Face is symmetric, tongue midline. Motor: Tone and bulk are normal. Power is impaired to 4+/5 right leg, flexors>extendors. Subtle atrophy appreciated in left proximal hip girdle musculature. Reflexes are symmetric, no pathologic reflexes present. Intact finger to nose bilaterally Sensory: Intact to light touch and temperature Gait: Mildly hemiparetic   Labs: I have reviewed the data as listed    Component Value Date/Time   NA 141 01/30/2018 0857   NA 140 03/26/2017 0928   K 4.8 01/30/2018 0857   K 4.7 03/26/2017 0928   CL 102 01/30/2018 0857   CO2 28 01/30/2018 0857   CO2 21 (L) 03/26/2017 0928   GLUCOSE 161 (H) 01/30/2018 0857   GLUCOSE 95 03/26/2017 0928   BUN 34 (H) 01/30/2018 0857   BUN 19.3 03/26/2017 0928   CREATININE 1.70 (H) 01/30/2018 0857   CREATININE 1.2 03/26/2017 0928   CALCIUM 10.3 01/30/2018 0857   CALCIUM 8.7 03/26/2017 0928   PROT 7.0 01/30/2018 0857   PROT 6.8 03/26/2017 0928   ALBUMIN 3.7 01/30/2018 0857   ALBUMIN 3.7 03/26/2017 0928   AST 22 01/30/2018 0857   AST 22 03/26/2017 0928   ALT 19 01/30/2018 0857   ALT 15 03/26/2017 0928   ALKPHOS 59 01/30/2018 0857   ALKPHOS 58 03/26/2017 0928   BILITOT 0.6 01/30/2018 0857   BILITOT 0.58 03/26/2017 0928   GFRNONAA 37 (L) 01/30/2018 0857   GFRAA 43 (L) 01/30/2018 0857   Lab Results  Component Value Date   WBC 4.4 01/30/2018   NEUTROABS 3.1 01/30/2018   HGB 14.3 01/30/2018   HCT 42.4 01/30/2018   MCV 92.0 01/30/2018   PLT 143 (L) 01/30/2018   Imaging:  [place]  Clinician Interpretation: I have personally reviewed the CNS images as listed.  My interpretation, in the context of the patient's clinical presentation, is stable disease  Ct Head W Wo Contrast  Result Date: 02/14/2018 CLINICAL DATA:  Follow up intracranial metastatic melanoma, status post multiple brain surgeries. EXAM: CT HEAD WITHOUT AND WITH CONTRAST TECHNIQUE: Contiguous axial images were obtained from the base of the skull through the vertex without and with intravenous contrast CONTRAST:  72m OMNIPAQUE IOHEXOL 300 MG/ML  SOLN COMPARISON:  CT HEAD October 10, 2017 and PET-CT January 29, 2018 and CT HEAD April 20, 2016. FINDINGS: BRAIN: Faintly dense RIGHT cerebellar 4 x 4 mm enhancing metastasis is relatively unchanged. Mild surrounding low-density vasogenic edema. Subtle 4 mm density RIGHT inferior frontal lobe (series 2, image 12) without distinct enhancement. No intraparenchymal hemorrhage, mass effect nor midline shift. LEFT frontal lobe encephalomalacia, faint similar enhancement with ex vacuo dilatation LEFT frontal horn of the lateral ventricle. No hydrocephalus. Patchy supratentorial white matter hypodensities exclusive aforementioned abnormality compatible with mild chronic small vessel ischemic changes, less than expected for age. No abnormal extra-axial fluid collections. VASCULAR: Trace calcific atherosclerosis of the carotid siphons. SKULL: No skull fracture. LEFT frontal craniotomy. Stable 12 mm lucency LEFT parietal calvarium. No significant scalp soft tissue swelling. LEFT frontal scalp scarring. SINUSES/ORBITS: Mild sphenoid  ethmoidal mucosal thickening. Small LEFT frontal mucosal retention cyst. Mastoid air cells are well aerated.The included ocular globes and orbital contents are non-suspicious. Status post bilateral ocular lens implants. OTHER: None. IMPRESSION: 1. Relatively stable solitary RIGHT cerebellar 4 x 4 mm metastasis without mass effect. 2. 4 mm faint density RIGHT inferior  frontal lobe, likely partial volume artifact. Recommend close attention on follow-up imaging. 3. Stable post treatment changes LEFT frontal lobe. Electronically Signed   By: Elon Alas M.D.   On: 02/14/2018 13:48   Nm Pet Image Restage (ps) Whole Body  Result Date: 01/30/2018 CLINICAL DATA:  Subsequent treatment strategy for metastatic melanoma diagnosed June 2017. Completed immunotherapy. History of left frontal brain metastasis excision 10/15/2015 with brain radiation therapy completed 01/27/2016. EXAM: NUCLEAR MEDICINE PET WHOLE BODY TECHNIQUE: 10.2 mCi F-18 FDG was injected intravenously. Full-ring PET imaging was performed from the skull base to thigh after the radiotracer. CT data was obtained and used for attenuation correction and anatomic localization. Fasting blood glucose: 85 mg/dl COMPARISON:  10/10/2017 PET-CT. FINDINGS: Mediastinal blood pool activity: SUV max 3.0 HEAD/NECK: No hypermetabolic activity in the scalp. No hypermetabolic cervical lymph nodes. Stable photopenic defect at the area of encephalomalacia in the left frontal lobe at the site of prior brain metastasis resection. There is a focus of hypermetabolism in the left paratracheal neck with max SUV 4.3, which appears to correlate to the posterior left thyroid lobe, without discrete thyroid nodule on the CT images, with decreased hypermetabolism with previous max SUV 6.1 on 10/10/2017 PET-CT. Incidental CT findings: Stable postsurgical changes from left frontal craniotomy with ex vacuo dilatation of anterior horn of the left lateral ventricle. CHEST: No hypermetabolic axillary, mediastinal or hilar lymph nodes. No hypermetabolic pulmonary findings. Incidental CT findings: Solid 0.8 cm lingular pulmonary nodule (series 8/image 47) is stable and demonstrates no significant metabolism. No acute consolidative airspace disease, lung masses or new significant pulmonary nodules. Stable configuration of 3 lead left subclavian pacemaker  with lead tips in the right atrium, right ventricular apex and coronary sinus. Coronary atherosclerosis status post CABG. Intact sternotomy wires. Atherosclerotic nonaneurysmal thoracic aorta. ABDOMEN/PELVIS: No abnormal hypermetabolic activity within the liver, pancreas, adrenal glands, or spleen. No hypermetabolic lymph nodes in the abdomen or pelvis. Previously described focus of low level subcutaneous metabolism in the right gluteal region has resolved with no mass in this location on the CT images, most compatible with resolved inflammatory focus. Incidental CT findings: Atherosclerotic abdominal aorta with stable 3.4 cm infrarenal abdominal aortic aneurysm. Marked diffuse colonic diverticulosis. Mildly enlarged prostate. SKELETON: No focal hypermetabolic activity to suggest skeletal metastasis. Incidental CT findings: Medial left knee compartment hemiarthroplasty. EXTREMITIES: No abnormal hypermetabolic activity in the lower extremities. Incidental CT findings: none IMPRESSION: 1. Small focus of hypermetabolism in the lower left paratracheal neck, which appears to localize to the posterior left thyroid lobe, without discrete thyroid nodule on the CT images, decreased in metabolism since 10/10/2017 PET-CT. This finding is nonspecific and may represent a hypermetabolic thyroid nodule. Consider thyroid ultrasound correlation. 2. No new hypermetabolic findings suspicious for metastatic disease. Previously described low level metabolism in the subcutaneous right gluteal region has resolved, compatible with resolved inflammatory focus. No evidence of recurrent hypermetabolism within the stable lingular pulmonary nodule. No evidence of recurrent osseous hypermetabolism. 3. Stable infrarenal 3.4 cm Abdominal Aortic Aneurysm (ICD10-I71.9). Recommend follow-up aortic ultrasound in 3 years. This recommendation follows ACR consensus guidelines: White Paper of the ACR Incidental Findings Committee II on Vascular Findings.  J Am Coll  Radiol 2013; 90:122-241. 4.  Aortic Atherosclerosis (ICD10-I70.0). Electronically Signed   By: Ilona Sorrel M.D.   On: 01/30/2018 10:21    Assessment/Plan 1. Brain metastases ([place])  2. Cognitive Impairment  Mr. Yanik is clinically and radiographically stable today.  Suspect recent subjective complaints are related to endocrine issues, not brain tumor.  We recommend he return to clinic in 6 months with a CT head for evaluation.    He will continue to follow with Dr. Burr Medico for his systemic therapy.  We appreciate the opportunity to participate in the care of Niguel Moure..   All questions were answered. The patient knows to call the clinic with any problems, questions or concerns. No barriers to learning were detected.  The total time spent in the encounter was 25 minutes minutes and more than 50% was on counseling and review of test results   Ventura Sellers, MD Medical Director of Neuro-Oncology [facility] [place] 02/22/18 10:49 AM

## 2018-02-25 ENCOUNTER — Other Ambulatory Visit: Payer: Self-pay | Admitting: *Deleted

## 2018-02-25 ENCOUNTER — Telehealth: Payer: Self-pay

## 2018-02-25 DIAGNOSIS — C7931 Secondary malignant neoplasm of brain: Secondary | ICD-10-CM

## 2018-02-25 NOTE — Telephone Encounter (Signed)
Printed avs and calender of upcoming appointment. Per 11/18 los

## 2018-02-26 ENCOUNTER — Telehealth: Payer: Self-pay

## 2018-02-26 NOTE — Telephone Encounter (Signed)
Spoke with pt relaying results. [See Result Note in Labs, 02/15/18.]

## 2018-02-26 NOTE — Telephone Encounter (Signed)
Attempted to contact pt. No answer.  No vm.  Need to relay results and message per Dr. Coralie Keens Labs, 02/15/18]  Results: "Testosterone levels returned low as expected, but PSA prostate level returned normal - suggesting testosterone was raising this. How is mood on new med Celexa?  If ongoing struggle, would offer urology evaluation."

## 2018-03-10 ENCOUNTER — Other Ambulatory Visit: Payer: Self-pay | Admitting: Family Medicine

## 2018-03-10 DIAGNOSIS — N401 Enlarged prostate with lower urinary tract symptoms: Secondary | ICD-10-CM

## 2018-03-10 DIAGNOSIS — R972 Elevated prostate specific antigen [PSA]: Secondary | ICD-10-CM

## 2018-03-10 DIAGNOSIS — E349 Endocrine disorder, unspecified: Secondary | ICD-10-CM

## 2018-03-11 ENCOUNTER — Ambulatory Visit: Payer: Medicare Other | Admitting: Family Medicine

## 2018-03-11 ENCOUNTER — Encounter: Payer: Self-pay | Admitting: Family Medicine

## 2018-03-11 ENCOUNTER — Ambulatory Visit (INDEPENDENT_AMBULATORY_CARE_PROVIDER_SITE_OTHER): Payer: Medicare Other | Admitting: Family Medicine

## 2018-03-11 VITALS — BP 110/90 | HR 88 | Temp 97.5°F | Ht 69.5 in | Wt 204.0 lb

## 2018-03-11 DIAGNOSIS — I255 Ischemic cardiomyopathy: Secondary | ICD-10-CM

## 2018-03-11 DIAGNOSIS — M25511 Pain in right shoulder: Secondary | ICD-10-CM | POA: Diagnosis not present

## 2018-03-11 NOTE — Progress Notes (Signed)
David [name]. - 77 y.o. male MRN [id number]  Date of birth: April 29, 1940  SUBJECTIVE:  Including CC & ROS.  Chief Complaint  Patient presents with  . Shoulder Pain    right shoulder     David [name]. is a 77 y.o. male that is presenting with right shoulder pain.  He reports the pain is acute on chronic in nature.  The pain is worse with abduction.  Pain is localized to his shoulder.  Pain is intermittent.  He denies any inciting event.  He was seen on 8/19 and received an intra-articular injection.  He reports improvement with that injection.  He has been through physical therapy and has been released.  He feels like his range of motion has been better.  He has not been able to play golf for about 3 years.  He feels weak ever since undergoing treatment for his melanoma.  Denies any radicular symptoms.  Independent review of the right shoulder x-ray from 8/19 shows glenohumeral arthritic changes.   Review of Systems  Constitutional: Negative for fever.  HENT: Negative for congestion.   Respiratory: Negative for cough.   Gastrointestinal: Negative for abdominal pain.  Musculoskeletal: Positive for arthralgias and back pain.  Skin: Negative for color change.  Neurological: Positive for weakness.  Hematological: Negative for adenopathy.  Psychiatric/Behavioral: Negative for agitation.    HISTORY: Past Medical, Surgical, Social, and Family History Reviewed & Updated per EMR.   Pertinent Historical Findings include:  Past Medical History:  Diagnosis Date  . Anginal pain ([place])   . Arthritis    mostly hands  . Benign familial hematuria   . BPH (benign prostatic hypertrophy)   . Brain cancer (David [name])    melanoma with brain met  . Coronary artery disease   . GERD (gastroesophageal reflux disease)   . High cholesterol   . Hypertension   . Hypothyroidism   . Intracerebral hemorrhage (David [name])   . Melanoma (David [name])   . Metastatic melanoma to lung (David [name])   . Mood disorder (David [name])     . Myocardial infarction (David [name])   . Pacemaker    due to syncope, 3rd degree HB (upgrade to [facility]. David [name] CRT-P 02/25/13 (Dr. Uvaldo Rising)  . Rectal bleed    due to NSAIDS  . Renal disorder   . Skin cancer    melanoma  . Sleep apnea    uses CPAP  . Stroke [facility])     Past Surgical History:  Procedure Laterality Date  . APPLICATION OF CRANIAL NAVIGATION N/A 10/15/2015   Procedure: APPLICATION OF CRANIAL NAVIGATION;  Surgeon: Erline Levine, MD;  Location: [place] NEURO ORS;  Service: Neurosurgery;  Laterality: N/A;  . CARDIAC CATHETERIZATION  2013  . CARDIAC SURGERY     bypass X 2  . COLONOSCOPY WITH PROPOFOL N/A 05/08/2016   diverticulosis, int hem, no f/u needed David [name])  . CORONARY ARTERY BYPASS GRAFT  2013   LIMA-LAD, SVG-PDA 03/27/11 (Dr. Francee Gentile)  . CRANIOTOMY N/A 10/15/2015   Procedure: LEFT FRONTAL CRANIOTOMY TUMOR EXCISION with Curve;  Surgeon: Erline Levine, MD;  Location: [place] NEURO ORS;  Service: Neurosurgery;  Laterality: N/A;  CRANIOTOMY TUMOR EXCISION  . EYE SURGERY    . FOOT SURGERY  08/2010  . JOINT REPLACEMENT Left    partial knee  . KNEE ARTHROSCOPY  12/2008  . LYMPH NODE BIOPSY    . PACEMAKER INSERTION    . TONSILLECTOMY      Allergies  Allergen Reactions  . Ezetimibe   .  Simvastatin Other (See Comments)    Reaction:  Gave pt a fever  Fever - temp of 103.   In 2003    Family History  Problem Relation Age of Onset  . Cancer Mother 32       lung   . Hypertension Mother   . Hypertension Father   . Heart attack Father   . Heart disease Father   . Rheum arthritis Sister   . Cancer Maternal Grandmother      Social History   Socioeconomic History  . Marital status: Married    Spouse name: Not on file  . Number of children: 2  . Years of education: Not on file  . Highest education level: Not on file  Occupational History  . Occupation: retired Pharmacist, community  Social Needs  . Financial resource strain: Not on file  . Food insecurity:    Worry: Not on file    Inability: Not  on file  . Transportation needs:    Medical: Not on file    Non-medical: Not on file  Tobacco Use  . Smoking status: Former Smoker    Packs/day: 1.00    Years: 3.00    Pack years: 3.00    Last attempt to quit: 04/10/1957    Years since quitting: 60.9  . Smokeless tobacco: Never Used  Substance and Sexual Activity  . Alcohol use: Yes    Alcohol/week: 14.0 standard drinks    Types: 10 Glasses of wine, 4 Cans of beer per week  . Drug use: No  . Sexual activity: Not Currently  Lifestyle  . Physical activity:    Days per week: Not on file    Minutes per session: Not on file  . Stress: Not on file  Relationships  . Social connections:    Talks on phone: Not on file    Gets together: Not on file    Attends religious service: Not on file    Active member of club or organization: Not on file    Attends meetings of clubs or organizations: Not on file    Relationship status: Not on file  . Intimate partner violence:    Fear of current or ex partner: Not on file    Emotionally abused: Not on file    Physically abused: Not on file    Forced sexual activity: Not on file  Other Topics Concern  . Not on file  Social History Narrative   Lives with wife David [name]) with dementia in [facility]   Daughter Dr David [name] and son   Occ: retired Pharmacist, community practiced in [place]   Has living will   Daughter David [name] is health care POA   Requests DNR-- done 09/14/15   Not sure about tube feeds     PHYSICAL EXAM:  VS: BP 110/90 (BP Location: Right Arm, Patient Position: Sitting, Cuff Size: Normal)   Pulse 88   Temp (!) 97.5 F (36.4 C) (Oral)   Ht 5' 9.5" (1.765 m)   Wt 204 lb (92.5 kg)   SpO2 95%   BMI 29.69 kg/m  Physical Exam Gen: NAD, alert, cooperative with exam, well-appearing ENT: normal lips, normal nasal mucosa,  Eye: normal EOM, normal conjunctiva and lids CV:  no edema, +2 pedal pulses   Resp: no accessory muscle use, non-labored,  Skin: no rashes, no areas of induration   Neuro: normal tone, normal sensation to touch Psych:  normal insight, alert and oriented MSK:  Right shoulder: Normal active flexion abduction.  Normal external rotation. Pain exacerbated with abduction and external rotation. Normal strength resistance with internal and external rotation. Some pain with resistance to empty can testing. Some pain with Hawkins testing. Normal grip strength. Neurovascular intact     ASSESSMENT & PLAN:   Acute pain of right shoulder I feel like his right shoulder pain likely is related to his degenerative changes as well as some impingement.  Discussed his treatment options and ongoing injections are probably the best route for him. -Provided Pennsaid samples -Counseled on home exercise therapy and supportive care. -Pain is not severe today.  So we will hold off on a subacromial or glenohumeral injection today. -Provided indications to follow-up

## 2018-03-11 NOTE — Patient Instructions (Signed)
Nice to meet you  Please place a pea size of the medicine on your finger and then rub it on the shoulder  Please use ice or heat on the shoulder. 20 minutes at a time and 3-4 times daily  Please try the exercises  Please try taking some golf swings  Please see me back in 3-4 weeks if no improvement.

## 2018-03-11 NOTE — Assessment & Plan Note (Signed)
I feel like his right shoulder pain likely is related to his degenerative changes as well as some impingement.  Discussed his treatment options and ongoing injections are probably the best route for him. -Provided Pennsaid samples -Counseled on home exercise therapy and supportive care. -Pain is not severe today.  So we will hold off on a subacromial or glenohumeral injection today. -Provided indications to follow-up

## 2018-03-19 ENCOUNTER — Other Ambulatory Visit: Payer: Self-pay | Admitting: Family Medicine

## 2018-03-19 DIAGNOSIS — E559 Vitamin D deficiency, unspecified: Secondary | ICD-10-CM

## 2018-03-19 DIAGNOSIS — E782 Mixed hyperlipidemia: Secondary | ICD-10-CM

## 2018-03-19 DIAGNOSIS — N183 Chronic kidney disease, stage 3 unspecified: Secondary | ICD-10-CM

## 2018-03-19 DIAGNOSIS — R7303 Prediabetes: Secondary | ICD-10-CM

## 2018-03-21 ENCOUNTER — Other Ambulatory Visit (INDEPENDENT_AMBULATORY_CARE_PROVIDER_SITE_OTHER): Payer: Medicare Other

## 2018-03-21 ENCOUNTER — Other Ambulatory Visit: Payer: Medicare Other

## 2018-03-21 DIAGNOSIS — N183 Chronic kidney disease, stage 3 unspecified: Secondary | ICD-10-CM

## 2018-03-21 DIAGNOSIS — E782 Mixed hyperlipidemia: Secondary | ICD-10-CM | POA: Diagnosis not present

## 2018-03-21 DIAGNOSIS — E559 Vitamin D deficiency, unspecified: Secondary | ICD-10-CM

## 2018-03-21 LAB — LIPID PANEL
CHOLESTEROL: 165 mg/dL (ref 0–200)
HDL: 40.7 mg/dL (ref >39.00)
NONHDL: 124.04
Total CHOL/HDL Ratio: 4
Triglycerides: 352 mg/dL — ABNORMAL HIGH (ref 0.0–149.0)
VLDL: 70.4 mg/dL — ABNORMAL HIGH (ref 0.0–40.0)

## 2018-03-21 LAB — RENAL FUNCTION PANEL
Albumin: 4.2 g/dL (ref 3.5–5.2)
BUN: 37 mg/dL — ABNORMAL HIGH (ref 6–23)
CHLORIDE: 102 meq/L (ref 96–112)
CO2: 30 mEq/L (ref 19–32)
CREATININE: 1.56 mg/dL — AB (ref 0.40–1.50)
Calcium: 9.6 mg/dL (ref 8.4–10.5)
GFR: 45.89 mL/min — AB (ref >60.00)
GLUCOSE: 84 mg/dL (ref 70–99)
PHOSPHORUS: 4.4 mg/dL (ref 2.3–4.6)
Potassium: 4.8 mEq/L (ref 3.5–5.1)
Sodium: 139 mEq/L (ref 135–145)

## 2018-03-21 LAB — LDL CHOLESTEROL, DIRECT: Direct LDL: 81 mg/dL

## 2018-03-21 LAB — VITAMIN D 25 HYDROXY (VIT D DEFICIENCY, FRACTURES): VITD: 26.89 ng/mL — ABNORMAL LOW (ref 30.00–100.00)

## 2018-03-26 ENCOUNTER — Encounter: Payer: Self-pay | Admitting: Family Medicine

## 2018-03-26 ENCOUNTER — Ambulatory Visit (INDEPENDENT_AMBULATORY_CARE_PROVIDER_SITE_OTHER): Payer: Medicare Other | Admitting: Family Medicine

## 2018-03-26 VITALS — BP 122/72 | HR 76 | Temp 97.8°F | Ht 69.5 in | Wt 205.0 lb

## 2018-03-26 DIAGNOSIS — E785 Hyperlipidemia, unspecified: Secondary | ICD-10-CM | POA: Diagnosis not present

## 2018-03-26 DIAGNOSIS — I255 Ischemic cardiomyopathy: Secondary | ICD-10-CM

## 2018-03-26 DIAGNOSIS — L210 Seborrhea capitis: Secondary | ICD-10-CM | POA: Diagnosis not present

## 2018-03-26 DIAGNOSIS — E349 Endocrine disorder, unspecified: Secondary | ICD-10-CM | POA: Diagnosis not present

## 2018-03-26 DIAGNOSIS — N401 Enlarged prostate with lower urinary tract symptoms: Secondary | ICD-10-CM | POA: Diagnosis not present

## 2018-03-26 DIAGNOSIS — F331 Major depressive disorder, recurrent, moderate: Secondary | ICD-10-CM

## 2018-03-26 DIAGNOSIS — R972 Elevated prostate specific antigen [PSA]: Secondary | ICD-10-CM | POA: Diagnosis not present

## 2018-03-26 MED ORDER — KETOCONAZOLE 2 % EX SHAM: 1.0000 "application " | MEDICATED_SHAMPOO | CUTANEOUS | 1 refills | Status: DC

## 2018-03-26 MED ORDER — TESTOSTERONE CYPIONATE 100 MG/ML IM SOLN: 100.0000 mg | INTRAMUSCULAR | 0 refills | Status: DC

## 2018-03-26 NOTE — Progress Notes (Signed)
BP 122/72 (BP Location: Left Arm, Patient Position: Sitting, Cuff Size: Normal)   Pulse 76   Temp 97.8 F (36.6 C) (Oral)   Ht 5' 9.5" (1.765 m)   Wt 205 lb (93 kg)   SpO2 94%   BMI 29.84 kg/m    CC: 39mo f/u visit Subjective:    Patient ID: David Stall., male    DOB: May 24, 1940, 77 y.o.   MRN: [id number]  HPI: David Auld. is a 77 y.o. male presenting on 03/26/2018 for Follow-up (Here for 3 mo f/u. )   See prior note for details.  Last visit we changed from sertraline to celexa 20mg  - some trouble sleeping with this. Wonders if sertraline worked better.   Upcoming appt with urology 04/16/2017 to review testosterone use with elevated PSA. He has felt unwell since testosterone injections stopped - malaise, fatigue, worsened depression. "I don't care what you need to do with my prostate" as long as he's able to restart testosterone.   Requests topical medicine to get rid of scalp flakes. Selsun blue has not helped.   Lab Results  Component Value Date   PSA 5.60 (H) 12/21/2017   PSA 4.90 (H) 05/10/2017   PSA 3.90 08/30/2015    Relevant past medical, surgical, family and social history reviewed and updated as indicated. Interim medical history since our last visit reviewed. Allergies and medications reviewed and updated. Outpatient Medications Prior to Visit  Medication Sig Dispense Refill  . acetaminophen (TYLENOL) 500 MG tablet Take 500 mg by mouth at bedtime.    Marland Kitchen amLODipine (NORVASC) 5 MG tablet TAKE ONE TABLET BY MOUTH EVERY DAY 32 tablet 11  . aspirin EC 81 MG tablet Take 81 mg by mouth daily.    . Cholecalciferol (VITAMIN D3) 1000 units CAPS Take 1 capsule (1,000 Units total) by mouth daily. 30 capsule   . citalopram (CELEXA) 20 MG tablet Take 1 tablet (20 mg total) by mouth daily. 30 tablet 3  . fluocinonide cream (LIDEX) 0.05 % Apply topically 2 (two) times daily as needed. 30 g 1  . levothyroxine (SYNTHROID, LEVOTHROID) 137 MCG tablet TAKE 1 TABLET  BY MOUTH ONCE DAILY BEFORE BREAKFAST 30 tablet 11  . metoprolol succinate (TOPROL-XL) 50 MG 24 hr tablet TAKE 1 AND 1/2 TABLET (75 MG) BY MOUTH EVERY DAY WITH OR IMMEDIATELY FOLLOWING A MEAL *DO NOT CRUSH* 50 tablet 11  . pentoxifylline (TRENTAL) 400 MG CR tablet Take 1 tablet (400 mg total) by mouth 2 (two) times daily. 60 tablet 6  . rosuvastatin (CRESTOR) 20 MG tablet TAKE ONE TABLET BY MOUTH AT BEDTIME 30 tablet 8  . sertraline (ZOLOFT) 100 MG tablet Take 1 tablet (100 mg total) by mouth daily.    . Turmeric 500 MG CAPS Take 2 capsules by mouth daily.    . vitamin E (VITAMIN E) 400 UNIT capsule Take 1 capsule (400 Units total) by mouth 2 (two) times daily. 60 capsule 6  . testosterone cypionate (DEPOTESTOTERONE CYPIONATE) 100 MG/ML injection Inject 1 mL (100 mg total) into the muscle every 14 (fourteen) days. 10 mL 0   Facility-Administered Medications Prior to Visit  Medication Dose Route Frequency Provider Last Rate Last Dose  . 0.9 %  sodium chloride infusion  1,000 mL Intravenous Once [name], MD         Per HPI unless specifically indicated in ROS section below Review of Systems     Objective:    BP 122/72 (BP Location: Left  Arm, Patient Position: Sitting, Cuff Size: Normal)   Pulse 76   Temp 97.8 F (36.6 C) (Oral)   Ht 5' 9.5" (1.765 m)   Wt 205 lb (93 kg)   SpO2 94%   BMI 29.84 kg/m   Wt Readings from Last 3 Encounters:  03/26/18 205 lb (93 kg)  03/11/18 204 lb (92.5 kg)  02/22/18 202 lb 8 oz (91.9 kg)    Physical Exam Vitals signs and nursing note reviewed.  Constitutional:      General: He is not in acute distress.    Appearance: Normal appearance.  Skin:    Comments: No erythema of scalp, but there is flaking present bilateral parietal scalp  Neurological:     Mental Status: He is alert.  Psychiatric:        Mood and Affect: Mood normal.    Results for orders placed or performed in visit on 03/21/18  Lipid panel  Result Value Ref Range   Cholesterol  165 0 - 200 mg/dL   Triglycerides 352.0 (H) 0.0 - 149.0 mg/dL   HDL 40.70 >39.00 mg/dL   VLDL 70.4 (H) 0.0 - 40.0 mg/dL   Total CHOL/HDL Ratio 4    NonHDL 124.04   VITAMIN D 25 Hydroxy (Vit-D Deficiency, Fractures)  Result Value Ref Range   VITD 26.89 (L) 30.00 - 100.00 ng/mL  Renal function panel  Result Value Ref Range   Sodium 139 135 - 145 mEq/L   Potassium 4.8 3.5 - 5.1 mEq/L   Chloride 102 96 - 112 mEq/L   CO2 30 19 - 32 mEq/L   Calcium 9.6 8.4 - 10.5 mg/dL   Albumin 4.2 3.5 - 5.2 g/dL   BUN 37 (H) 6 - 23 mg/dL   Creatinine, Ser 1.56 (H) 0.40 - 1.50 mg/dL   Glucose, Bld 84 70 - 99 mg/dL   Phosphorus 4.4 2.3 - 4.6 mg/dL   GFR 45.89 (L) >60.00 mL/min  LDL cholesterol, direct  Result Value Ref Range   Direct LDL 81.0 mg/dL      Assessment & Plan:   Problem List Items Addressed This Visit    MDD (major depressive disorder), recurrent episode, moderate (HCC)    No significant improvement on celexa in place of sertraline. Will start testosterone - see above.       Hypotestosteronemia - Primary    Marked malaise/fatigue since stopping testosterone feels miserable, desires to restart testosterone at all costs. We had long discussion about elevated PSA and drop from 5.6 --> 2.8 since stopping testosterone. Discussed ddx includes prostate cancer. Will appreciate uro input.  At this time will restart testosterone 100mg  IM Q2 wks. RTC 2 mo f/u visit and labs.       Elevated PSA    PSA 5.6 --> 2.8 after 2 months off testosterone.       Dyslipidemia    crestor recently increased from 10 to 20mg . Triglycerides remain elevated. Has not tolerated ezetimibe in the past. ?vascepa - suggested he get cards recs on this med.       Dandruff in adult    Rx nizoral shampoo.       Benign prostatic hyperplasia       Meds ordered this encounter  Medications  . testosterone cypionate (DEPOTESTOTERONE CYPIONATE) 100 MG/ML injection    Sig: Inject 1 mL (100 mg total) into the muscle  every 14 (fourteen) days.    Dispense:  10 mL    Refill:  0  . ketoconazole (NIZORAL) 2 %  shampoo    Sig: Apply 1 application topically 2 (two) times a week.    Dispense:  120 mL    Refill:  1   No orders of the defined types were placed in this encounter.  Patient Instructions  Schedule follow up appointment with [name] cardiologist for recommendations on cholesterol (triglyceride) medicine (?vascepa).  Ok to restart testosterone shots.  Keep urology appointment for next month.  Return in 2 months for follow up visit.    Follow up plan: Return in about 2 months (around 05/27/2018) for follow up visit.  [name], MD

## 2018-03-26 NOTE — Patient Instructions (Addendum)
Schedule follow up appointment with [name] cardiologist for recommendations on cholesterol (triglyceride) medicine (?vascepa).  Ok to restart testosterone shots.  Keep urology appointment for next month.  Return in 2 months for follow up visit.

## 2018-03-27 ENCOUNTER — Telehealth: Payer: Self-pay | Admitting: Family Medicine

## 2018-03-27 DIAGNOSIS — E785 Hyperlipidemia, unspecified: Secondary | ICD-10-CM | POA: Insufficient documentation

## 2018-03-27 DIAGNOSIS — L210 Seborrhea capitis: Secondary | ICD-10-CM | POA: Insufficient documentation

## 2018-03-27 NOTE — Assessment & Plan Note (Signed)
Marked malaise/fatigue since stopping testosterone feels miserable, desires to restart testosterone at all costs. We had long discussion about elevated PSA and drop from 5.6 --> 2.8 since stopping testosterone. Discussed ddx includes prostate cancer. Will appreciate uro input.  At this time will restart testosterone 100mg  IM Q2 wks. RTC 2 mo f/u visit and labs.

## 2018-03-27 NOTE — Assessment & Plan Note (Signed)
PSA 5.6 --> 2.8 after 2 months off testosterone.

## 2018-03-27 NOTE — Telephone Encounter (Signed)
Placed letter in Dr. Synthia Innocent box.

## 2018-03-27 NOTE — Assessment & Plan Note (Addendum)
Rx nizoral shampoo.

## 2018-03-27 NOTE — Assessment & Plan Note (Signed)
No significant improvement on celexa in place of sertraline. Will start testosterone - see above.

## 2018-03-27 NOTE — Telephone Encounter (Signed)
Patient dropped off a paper to be sent to [facility] via fax. Dr. Danise Mina needs to send approval for patient to have  restricted driving privilege. Placed in [place] tower.

## 2018-03-27 NOTE — Assessment & Plan Note (Signed)
crestor recently increased from 10 to 20mg . Triglycerides remain elevated. Has not tolerated ezetimibe in the past. ?vascepa - suggested he get cards recs on this med.

## 2018-04-01 ENCOUNTER — Inpatient Hospital Stay: Payer: Medicare Other | Attending: Hematology | Admitting: Internal Medicine

## 2018-04-01 VITALS — BP 117/77 | HR 78 | Temp 97.7°F | Resp 18 | Ht 69.0 in | Wt 207.7 lb

## 2018-04-01 DIAGNOSIS — Z87891 Personal history of nicotine dependence: Secondary | ICD-10-CM | POA: Diagnosis not present

## 2018-04-01 DIAGNOSIS — Z79899 Other long term (current) drug therapy: Secondary | ICD-10-CM | POA: Diagnosis not present

## 2018-04-01 DIAGNOSIS — C7801 Secondary malignant neoplasm of right lung: Secondary | ICD-10-CM | POA: Diagnosis not present

## 2018-04-01 DIAGNOSIS — C7931 Secondary malignant neoplasm of brain: Secondary | ICD-10-CM | POA: Diagnosis not present

## 2018-04-01 DIAGNOSIS — Z801 Family history of malignant neoplasm of trachea, bronchus and lung: Secondary | ICD-10-CM

## 2018-04-01 DIAGNOSIS — C7951 Secondary malignant neoplasm of bone: Secondary | ICD-10-CM

## 2018-04-01 DIAGNOSIS — C439 Malignant melanoma of skin, unspecified: Secondary | ICD-10-CM

## 2018-04-01 DIAGNOSIS — Z9221 Personal history of antineoplastic chemotherapy: Secondary | ICD-10-CM

## 2018-04-01 DIAGNOSIS — R4189 Other symptoms and signs involving cognitive functions and awareness: Secondary | ICD-10-CM

## 2018-04-01 DIAGNOSIS — C773 Secondary and unspecified malignant neoplasm of axilla and upper limb lymph nodes: Secondary | ICD-10-CM

## 2018-04-01 DIAGNOSIS — Z7982 Long term (current) use of aspirin: Secondary | ICD-10-CM

## 2018-04-01 DIAGNOSIS — Z923 Personal history of irradiation: Secondary | ICD-10-CM | POA: Diagnosis not present

## 2018-04-01 MED ORDER — METHYLPHENIDATE HCL 10 MG PO TABS: 10.0000 mg | ORAL_TABLET | Freq: Every day | ORAL | 0 refills | Status: DC

## 2018-04-01 NOTE — Telephone Encounter (Signed)
Letter written and in Lisa's box.  May send pt copy if he desires.

## 2018-04-01 NOTE — Progress Notes (Signed)
[facility] at [place], [place] [address] [phone]   Interval Evaluation  Date of Service: 04/01/18 Patient Name: David [name]. Patient MRN: [id number] Patient DOB: [birth year] Provider: Ventura Sellers, MD  Identifying Statement:  Samad Thon. is a 77 y.o. male with melanoma, brain metastases  Primary Cancer: Melanoma Stage IV  Oncologic History: Oncology History   Metastatic melanoma [facility])   Staging form: Melanoma of the Skin, AJCC 7th Edition     Clinical stage from 09/21/2015: Stage IV [place], [place], [place]) - Signed by [name], MD on 10/09/2015       Malignant melanoma ([place])   09/16/2015 Imaging    PET scan showed a hypermetabolic 6.9SW lingular nodule, and a 59m right upper lobe lung nodule, left subclavicular nodes, and bone metastasis in the proximal right humerus and right femur.     09/21/2015 Initial Diagnosis    Metastatic melanoma ([place]    09/21/2015 Initial Biopsy    Left subclavicular lymph node biopsy showed prostatic of melanoma    09/21/2015 Miscellaneous    BRAF V600K mutation (+)     10/04/2015 Imaging    CT head with without contrast showed a 2.0 x 1.6 x 1.7 cm superficial left frontal hyperdense lesion, with postcontrast enhancement, lying within the previous hemorrhagic area, concerning for solitary melanoma metastasis    10/06/2015 - 09/06/2016 Chemotherapy    Nivolumab '240mg'$  every 2 weeks, held due to his dexamethasone for radionecrosis     10/13/2015 - 10/13/2015 Radiation Therapy    10/13/2015 Preop SRS treatment: 14 Gy  to the left frontal lesion in 1 fraction    10/15/2015 Surgery    left front craniotomy and tumor excision     10/15/2015 Pathology Results    left frontal brain mass excision showed metastatic melanoma, 1.5X1.5X0.6cm    01/13/2016 Imaging    CT head without and with contrast showed ill-defined enhancement and dural thickening of the left frontal resection cavity may  represent a post treatment changes or residual neoplasm. A new 6 mm hypodensity in the right cerebellar hemisphere with uncertainty enhancement may represent a metastasis or small brain parenchymal hemorrhage.     01/27/2016 - 01/27/2016 Radiation Therapy    01/27/16 SRS Treatment:  20 Gy in 1 fraction to a 6 mm right cerebellar brain met     03/29/2016 PET scan    PET 03/29/2016 IMPRESSION: 1. Overall there has been an interval response to therapy. The index lesions sided on previous exam it are less hypermetabolic when compared with the previous exam. 2. There is an intramuscular lesion within the right masseter which has increased FDG uptake compared with previous exam. 3. No new sites of disease identified. 4. Aortic atherosclerosis and infrarenal abdominal aortic aneurysm. Recommend followup by ultrasound in 3 years. This recommendation follows ACR consensus guidelines: White Paper of the ACR Incidental Findings Committee II on Vascular Findings. JJoellyn RuedRadiol 2013; 10:789-794    04/20/2016 Imaging    CT Head w wo contrast 1. Mild patchy enhancement at the left anterior frontal lobe treatment site (series 11, image 39) without mass effect and stable surrounding white matter hypodensity without strong evidence of acute vasogenic edema. Recommend continued CT surveillance. 2. No new metastatic disease or new intracranial abnormality identified. 3. Probable benign 14 mm left parietal bone lucent lesion, unchanged since May 2017.    05/06/2016 - 05/08/2016 Hospital Admission    Pt was admitted  for rectal bleeding for one day. Breathing spontaneously resolved, hemoglobin dropped from 15 to 10.5, did not require blood transfusion. Colonoscopy showed no active bleeding, except diverticulosis and hemorrhoid. He was discharged to home.    05/08/2016 Procedure    Colonoscopy  - Stool in the ascending colon. Fluid aspiration performed. - Diverticulosis in the entire examined colon. -  Non-bleeding internal hemorrhoids. - The examination was otherwise normal on direct and retroflexion views.    05/29/2016 Imaging    PET Image Restaging IMPRESSION: 1. Stable exam with no evidence progression. 2. Photopenia in the LEFT frontal lobe at prior surgical site. 3. Stable mildly hypermetabolic RIGHT paratracheal node. This may represent a thyroid nodule. 4. Stable size of minimally metabolic lingular pulmonary nodule. 5. Stable skeletal lesion of the RIGHT iliac crest 6. No cutaneous metabolic lesion.    07/20/2016 Imaging    CT Head W WO Contrast IMPRESSION: Pronounced change in the left frontal region. Much more regional vasogenic edema. Relatively stable appearance of the abnormal enhancement extending from the lateral margin of the frontal horn of left lateral ventricle to the frontal brain surface. New region of abnormal enhancement surrounding the frontal horn of the left lateral ventricle measuring approximately 3.2 cm. The differential diagnosis is recurrent tumor versus radiation necrosis. The patient is reportedly not an MRI candidate. This area is large enough that CT perfusion could possibly make a reasonable differentiation.    09/02/2016 Imaging    CT Head 09/02/16 IMPRESSION: Somewhat mixed pattern of slight posterior progression of pleomorphic enhancement surrounding the LEFT lateral ventricle although no significant mass effect, and reduced vasogenic edema compared with the scan in April.    09/27/2016 - 11/02/2016 Chemotherapy    Start dabrafenib and trametinib 09/27/16, stopped due to severe fatigue      10/05/2016 Imaging    CT Head w & w/o contrast  IMPRESSION: 1. Decreased size of the enhancing area within the left frontal lobe treatment site. Surrounding edema has also decreased. The findings support continued evolution of radiation necrosis. 2. No new enhancing lesions.    10/18/2016 PET scan    IMPRESSION: 1. New focal hypermetabolism in the  proximal sigmoid colon with associated focal colonic wall thickening and mild pericolonic fat stranding at the site of a proximal sigmoid diverticulum, favored to represent mild acute sigmoid diverticulitis. Metastatic disease is considered less likely. Clinical correlation is necessary. Consider appropriate antibiotic therapy, as clinically warranted. Attention to this region recommended on future PET-CT follow-up. 2. Otherwise complete metabolic response with no evidence of hypermetabolic metastatic disease. Lingular pulmonary nodule is slightly decreased in size and demonstrates no significant metabolism. 3. No residual hypermetabolism in the right thyroid lobe nodule, which is stable in size. 4. Aortic Atherosclerosis (ICD10-I70.0). Stable 3.1 cm infrarenal abdominal aortic aneurysm. Aortic aneurysm NOS (ICD10-I71.9). Recommend followup by ultrasound in 3 years. This recommendation follows ACR consensus guidelines: White Paper of the ACR Incidental Findings Committee II on Vascular Findings. Joellyn Rued Radiol 2013; 38:756-433.    11/01/2016 - 11/04/2016 Hospital Admission    Patient admitted to the hospital due to hypercalcemia where he received IV fluids and pamidronate    12/13/2016 PET scan    PET 12/13/16 IMPRESSION: 1. No findings to suggest metastatic disease on today's examination. 2. Previously noted hypermetabolism in the sigmoid colon has resolved, presumably indicative of a focus of diverticulitis on the prior study. Today's study does demonstrate relatively diffuse hypermetabolism throughout the cecum, ascending colon and proximal transverse colon where there is  some mild mural thickening, favored to reflect mild chronic inflammation related to underlying diverticular disease. No definite inflammatory changes on the CT portion of the examination to strongly suggest an active acute diverticulitis at this time. No discrete lesion to suggest neoplasm. 3. Previously noted 8  mm lingular nodule is stable in size and known remains non hypermetabolic. This is nonspecific but reassuring. Continued attention on future followup studies is recommended. 4. Aortic atherosclerosis, in addition to left main and 3 vessel coronary artery disease. Status post median sternotomy for CABG including LIMA to the LAD. In addition, there is mild fusiform aneurysmal dilatation of the infrarenal abdominal aorta (3.1 cm in diameter) as well as aneurysmal dilatation of the left common iliac    12/18/2016 - 12/31/2017 Chemotherapy    Restarted nivolamab '240mg'$  every 2 weeks, with tapering dose dexamethasone      04/18/2017 Imaging    PET Scan IMPRESSION: 1. No hypermetabolic lesion to suggest active malignancy. 2. Stable 8 mm lingular nodule is not currently hypermetabolic but merit surveillance. Infrarenal abdominal aortic aneurysm 3.1 cm in diameter. Recommend followup by [place] in 3 years. 3. Other imaging findings of potential clinical significance: Postoperative findings in the left frontal lobe. Right renal cyst. Aortic Atherosclerosis (ICD10-I70.0). Osteoarthritis. Lumbar scoliosis. Left knee effusion. Colonic diverticulosis.     10/10/2017 PET scan    IMPRESSION: 1. Small focus of FDG accumulation in the subcutaneous fat of the right gluteal region, nonspecific. Attention on follow-up suggested. 2. Otherwise stable exam without new or progressive hypermetabolic disease in the interval since prior PET-CT. 3. Stable 8 mm nodule in the lingula. No evidence for hypermetabolism on PET imaging. 4.  Aortic Atherosclerois (ICD10-170.0)     01/29/2018 PET scan    IMPRESSION: 1. Small focus of hypermetabolism in the lower left paratracheal neck, which appears to localize to the posterior left thyroid lobe, without discrete thyroid nodule on the CT images, decreased in metabolism since 10/10/2017 PET-CT. This finding is nonspecific and may represent a hypermetabolic thyroid nodule. Consider  thyroid ultrasound correlation. 2. No new hypermetabolic findings suspicious for metastatic disease. Previously described low level metabolism in the subcutaneous right gluteal region has resolved, compatible with resolved inflammatory focus. No evidence of recurrent hypermetabolism within the stable lingular pulmonary nodule. No evidence of recurrent osseous hypermetabolism. 3. Stable infrarenal 3.4 cm Abdominal Aortic Aneurysm (ICD10-I71.9). Recommend follow-up aortic ultrasound in 3 years. This recommendation follows ACR consensus guidelines: White Paper of the ACR Incidental Findings Committee II on Vascular Findings. J Am Coll Radiol 2013; 56:861-683. 4.  Aortic Atherosclerosis (ICD10-I70.0).     Interval History:  Broc Caspers. presents today for follow up with his daughter. They describe recent issues with concentration and staying on task.  He feels difficulty with previously learned tasks which take focus, such as playing piano. He has passed his driving evaluation and would like to address concentration deficits which he feels could potentially impact his driving safety and ability.  Otherwise no new or progressive neurologic deficits.     Medications: Current Outpatient Medications on File Prior to Visit  Medication Sig Dispense Refill  . acetaminophen (TYLENOL) 500 MG tablet Take 500 mg by mouth at bedtime.    Marland Kitchen amLODipine (NORVASC) 5 MG tablet TAKE ONE TABLET BY MOUTH EVERY DAY 32 tablet 11  . aspirin EC 81 MG tablet Take 81 mg by mouth daily.    . Cholecalciferol (VITAMIN D3) 1000 units CAPS Take 1 capsule (1,000 Units total) by mouth daily. [place]  capsule   . citalopram (CELEXA) 20 MG tablet Take 1 tablet (20 mg total) by mouth daily. 30 tablet 3  . fluocinonide cream (LIDEX) 0.05 % Apply topically 2 (two) times daily as needed. 30 g 1  . ketoconazole (NIZORAL) 2 % shampoo Apply 1 application topically 2 (two) times a week. 120 mL 1  . levothyroxine (SYNTHROID,  LEVOTHROID) 137 MCG tablet TAKE 1 TABLET BY MOUTH ONCE DAILY BEFORE BREAKFAST 30 tablet 11  . metoprolol succinate (TOPROL-XL) 50 MG 24 hr tablet TAKE 1 AND 1/2 TABLET (75 MG) BY MOUTH EVERY DAY WITH OR IMMEDIATELY FOLLOWING A MEAL *DO NOT CRUSH* 50 tablet 11  . pentoxifylline (TRENTAL) 400 MG CR tablet Take 1 tablet (400 mg total) by mouth 2 (two) times daily. 60 tablet 6  . rosuvastatin (CRESTOR) 20 MG tablet TAKE ONE TABLET BY MOUTH AT BEDTIME 30 tablet 8  . sertraline (ZOLOFT) 100 MG tablet Take 1 tablet (100 mg total) by mouth daily.    Marland Kitchen testosterone cypionate (DEPOTESTOTERONE CYPIONATE) 100 MG/ML injection Inject 1 mL (100 mg total) into the muscle every 14 (fourteen) days. 10 mL 0  . Turmeric 500 MG CAPS Take 2 capsules by mouth daily.    . vitamin E (VITAMIN E) 400 UNIT capsule Take 1 capsule (400 Units total) by mouth 2 (two) times daily. 60 capsule 6   Current Facility-Administered Medications on File Prior to Visit  Medication Dose Route Frequency Provider Last Rate Last Dose  . 0.9 %  sodium chloride infusion  1,000 mL Intravenous Once [name], MD        Allergies:  Allergies  Allergen Reactions  . Ezetimibe   . Simvastatin Other (See Comments)    Reaction:  Gave pt a fever  Fever - temp of 103.   In 2003   Past Medical History:  Past Medical History:  Diagnosis Date  . Anginal pain ([place])   . Arthritis    mostly hands  . Benign familial hematuria   . BPH (benign prostatic hypertrophy)   . Brain cancer ([place])    melanoma with brain met  . Coronary artery disease   . GERD (gastroesophageal reflux disease)   . High cholesterol   . Hypertension   . Hypothyroidism   . Intracerebral hemorrhage ([place])   . Melanoma ([place])   . Metastatic melanoma to lung ([place])   . Mood disorder ([place])   . Myocardial infarction ([place])   . Pacemaker    due to syncope, 3rd degree HB (upgrade to [facility]. Jude CRT-P 02/25/13 (Dr. Uvaldo Rising)  . Rectal bleed    due to NSAIDS  . Renal disorder   . Skin  cancer    melanoma  . Sleep apnea    uses CPAP  . Stroke [facility])    Past Surgical History:  Past Surgical History:  Procedure Laterality Date  . APPLICATION OF CRANIAL NAVIGATION N/A 10/15/2015   Procedure: APPLICATION OF CRANIAL NAVIGATION;  Surgeon: Erline Levine, MD;  Location: [place] NEURO ORS;  Service: Neurosurgery;  Laterality: N/A;  . CARDIAC CATHETERIZATION  2013  . CARDIAC SURGERY     bypass X 2  . COLONOSCOPY WITH PROPOFOL N/A 05/08/2016   diverticulosis, int hem, no f/u needed [name])  . CORONARY ARTERY BYPASS GRAFT  2013   LIMA-LAD, SVG-PDA 03/27/11 (Dr. Francee Gentile)  . CRANIOTOMY N/A 10/15/2015   Procedure: LEFT FRONTAL CRANIOTOMY TUMOR EXCISION with Curve;  Surgeon: Erline Levine, MD;  Location: [place] NEURO ORS;  Service: Neurosurgery;  Laterality: N/A;  CRANIOTOMY TUMOR EXCISION  . EYE SURGERY    . FOOT SURGERY  08/2010  . JOINT REPLACEMENT Left    partial knee  . KNEE ARTHROSCOPY  12/2008  . LYMPH NODE BIOPSY    . PACEMAKER INSERTION    . TONSILLECTOMY     Social History:  Social History   Socioeconomic History  . Marital status: Married    Spouse name: Not on file  . Number of children: 2  . Years of education: Not on file  . Highest education level: Not on file  Occupational History  . Occupation: retired Pharmacist, community  Social Needs  . Financial resource strain: Not on file  . Food insecurity:    Worry: Not on file    Inability: Not on file  . Transportation needs:    Medical: Not on file    Non-medical: Not on file  Tobacco Use  . Smoking status: Former Smoker    Packs/day: 1.00    Years: 3.00    Pack years: 3.00    Last attempt to quit: 04/10/1957    Years since quitting: 61.0  . Smokeless tobacco: Never Used  Substance and Sexual Activity  . Alcohol use: Yes    Alcohol/week: 14.0 standard drinks    Types: 10 Glasses of wine, 4 Cans of beer per week  . Drug use: No  . Sexual activity: Not Currently  Lifestyle  . Physical activity:    Days per week: Not on file      Minutes per session: Not on file  . Stress: Not on file  Relationships  . Social connections:    Talks on phone: Not on file    Gets together: Not on file    Attends religious service: Not on file    Active member of club or organization: Not on file    Attends meetings of clubs or organizations: Not on file    Relationship status: Not on file  . Intimate partner violence:    Fear of current or ex partner: Not on file    Emotionally abused: Not on file    Physically abused: Not on file    Forced sexual activity: Not on file  Other Topics Concern  . Not on file  Social History Narrative   Lives with wife [name]) with dementia in [facility]   Daughter [name] and son   Occ: retired Pharmacist, community practiced in [place]   Has living will   [name] is health care POA   Requests DNR-- done 09/14/15   Not sure about tube feeds   Family History:  Family History  Problem Relation Age of Onset  . Cancer Mother 24       lung   . Hypertension Mother   . Hypertension Father   . Heart attack Father   . Heart disease Father   . Rheum arthritis Sister   . Cancer Maternal Grandmother     Review of Systems: Constitutional: Denies fevers, chills or abnormal weight loss Eyes: Denies blurriness of vision Ears, nose, mouth, throat, and face: Denies mucositis or sore throat Respiratory: Denies cough, dyspnea or wheezes Cardiovascular: Denies palpitation, chest discomfort or lower extremity swelling Gastrointestinal:  Denies nausea, constipation, diarrhea GU: Denies dysuria or incontinence Skin: Denies abnormal skin rashes Neurological: Per HPI Musculoskeletal: Denies joint pain, back or neck discomfort. No decrease in ROM Behavioral/Psych: Denies anxiety, disturbance in thought content, and mood instability   Physical Exam: Vitals:   04/01/18 0915  BP:  117/77  Pulse: 78  Resp: 18  Temp: 97.7 F (36.5 C)  SpO2: 96%   KPS: 70. General: Alert, cooperative,  pleasant, in no acute distress Head: Craniotomy scar noted, dry and intact. EENT: No conjunctival injection or scleral icterus. Oral mucosa moist Lungs: Resp effort normal Cardiac: Regular rate and rhythm Abdomen: Soft, non-distended abdomen Skin: No rashes cyanosis or petechiae. Extremities: No clubbing or edema  Neurologic Exam: Mental Status: Awake, alert, attentive to examiner. Oriented to self and environment. Language is fluent with intact comprehension.  Age advanced psychomotor slowing.  Limited insight at times.  Cranial Nerves: Visual acuity is grossly normal. Visual fields are full. Extra-ocular movements intact. No ptosis. Face is symmetric, tongue midline. Motor: Tone and bulk are normal. Power is impaired to 4+/5 right leg, flexors>extendors. Subtle atrophy appreciated in left proximal hip girdle musculature. Reflexes are symmetric, no pathologic reflexes present. Intact finger to nose bilaterally Sensory: Intact to light touch and temperature Gait: Mildly hemiparetic   Labs: I have reviewed the data as listed    Component Value Date/Time   NA 139 03/21/2018 1106   NA 140 03/26/2017 0928   K 4.8 03/21/2018 1106   K 4.7 03/26/2017 0928   CL 102 03/21/2018 1106   CO2 30 03/21/2018 1106   CO2 21 (L) 03/26/2017 0928   GLUCOSE 84 03/21/2018 1106   GLUCOSE 95 03/26/2017 0928   BUN 37 (H) 03/21/2018 1106   BUN 19.3 03/26/2017 0928   CREATININE 1.56 (H) 03/21/2018 1106   CREATININE 1.2 03/26/2017 0928   CALCIUM 9.6 03/21/2018 1106   CALCIUM 8.7 03/26/2017 0928   PROT 7.0 01/30/2018 0857   PROT 6.8 03/26/2017 0928   ALBUMIN 4.2 03/21/2018 1106   ALBUMIN 3.7 03/26/2017 0928   AST 22 01/30/2018 0857   AST 22 03/26/2017 0928   ALT 19 01/30/2018 0857   ALT 15 03/26/2017 0928   ALKPHOS 59 01/30/2018 0857   ALKPHOS 58 03/26/2017 0928   BILITOT 0.6 01/30/2018 0857   BILITOT 0.58 03/26/2017 0928   GFRNONAA 37 (L) 01/30/2018 0857   GFRAA 43 (L) 01/30/2018 0857   Lab  Results  Component Value Date   WBC 4.4 01/30/2018   NEUTROABS 3.1 01/30/2018   HGB 14.3 01/30/2018   HCT 42.4 01/30/2018   MCV 92.0 01/30/2018   PLT 143 (L) 01/30/2018    Assessment/Plan 1. Brain metastases ([place])  2. Cognitive Impairment  Mr. Goswami presents today with complaints consistent with features of an attention deficit.  This is likely related to underlying brain atrophy from radiation, particularly that affecting the left frontal lobe.  We discussed starting him on a low dose of Ritalin, '10mg'$  daily, as a therapeutic intervention.  If this is somewhat efficacious dose could be further titrated.  We reviewed side effects including theoretical risk of seizures, behavioral changes.    They will contact us in 1-2 weeks with feedback on response to Ritalin.  He will continue to follow with Dr. Burr Medico for melanoma.  We appreciate the opportunity to participate in the care of Zackerie Sara..   All questions were answered. The patient knows to call the clinic with any problems, questions or concerns. No barriers to learning were detected.  The total time spent in the encounter was 25 minutes minutes and more than 50% was on counseling and review of test results   Ventura Sellers, MD Medical Director of Neuro-Oncology [facility] [place] 04/01/18 9:06 AM

## 2018-04-01 NOTE — Telephone Encounter (Addendum)
Left message for pt to call back. Need to let him know the letters were faxed to [facility] and the original plus Dr. Synthia Innocent letter is being mailed to him for his records.   Faxed all info to [facility] at [phone], Ref: Cust No. [id number].  Phn #  [phone].  [Made copy of DMV letter to scan.  Mailed original DMV letter and Dr. Synthia Innocent letter to pt.]

## 2018-04-04 ENCOUNTER — Telehealth: Payer: Self-pay

## 2018-04-04 NOTE — Telephone Encounter (Signed)
Per 12/23 no los

## 2018-04-16 ENCOUNTER — Ambulatory Visit (INDEPENDENT_AMBULATORY_CARE_PROVIDER_SITE_OTHER): Payer: Medicare Other | Admitting: Urology

## 2018-04-16 ENCOUNTER — Encounter: Payer: Self-pay | Admitting: Urology

## 2018-04-16 VITALS — BP 106/62 | HR 97 | Ht 69.0 in | Wt 198.0 lb

## 2018-04-16 DIAGNOSIS — E291 Testicular hypofunction: Secondary | ICD-10-CM | POA: Diagnosis not present

## 2018-04-16 DIAGNOSIS — Z87898 Personal history of other specified conditions: Secondary | ICD-10-CM

## 2018-04-16 NOTE — Progress Notes (Signed)
04/16/2018 3:26 PM   David [name]. 1940/09/10 [id number]  Referring provider: [name], MD [address] [place], [place] [address]  Chief Complaint  Patient presents with  . Elevated PSA    HPI: 78 year old male seen at request of Dr. Danise Mina for evaluation of an elevated PSA while on testosterone replacement therapy.  He was started on TRT for symptoms of tiredness, malaise and fatigue with improvement in the symptoms.  His baseline PSA was in the upper 3 range and while on replacement had increased to 4.9 then 5.6.  His testosterone was discontinued due to this rise and a repeat PSA was 2.8.  Due to significant symptoms that he was restarted on replacement on 12/17.  He has no bothersome lower urinary tract symptoms.  Denies dysuria, gross hematuria or flank/abdominal/pelvic/scrotal pain.   PMH: Past Medical History:  Diagnosis Date  . Anginal pain ([place])   . Arthritis    mostly hands  . Benign familial hematuria   . BPH (benign prostatic hypertrophy)   . Brain cancer ([place])    melanoma with brain met  . Coronary artery disease   . GERD (gastroesophageal reflux disease)   . High cholesterol   . Hypertension   . Hypothyroidism   . Intracerebral hemorrhage ([place])   . Melanoma ([place])   . Metastatic melanoma to lung ([place])   . Mood disorder ([place])   . Myocardial infarction ([place])   . Pacemaker    due to syncope, 3rd degree HB (upgrade to [facility]. Jude CRT-P 02/25/13 (Dr. Uvaldo Rising)  . Rectal bleed    due to NSAIDS  . Renal disorder   . Skin cancer    melanoma  . Sleep apnea    uses CPAP  . Stroke [facility])     Surgical History: Past Surgical History:  Procedure Laterality Date  . APPLICATION OF CRANIAL NAVIGATION N/A 10/15/2015   Procedure: APPLICATION OF CRANIAL NAVIGATION;  Surgeon: Erline Levine, MD;  Location: [place] NEURO ORS;  Service: Neurosurgery;  Laterality: N/A;  . CARDIAC CATHETERIZATION  2013  . CARDIAC SURGERY     bypass X 2  . COLONOSCOPY  WITH PROPOFOL N/A 05/08/2016   diverticulosis, int hem, no f/u needed [name])  . CORONARY ARTERY BYPASS GRAFT  2013   LIMA-LAD, SVG-PDA 03/27/11 (Dr. Francee Gentile)  . CRANIOTOMY N/A 10/15/2015   Procedure: LEFT FRONTAL CRANIOTOMY TUMOR EXCISION with Curve;  Surgeon: Erline Levine, MD;  Location: [place] [facility]URO ORS;  Service: Neurosurgery;  Laterality: N/A;  CRANIOTOMY TUMOR EXCISION  . EYE SURGERY    . FOOT SURGERY  08/2010  . JOINT REPLACEMENT Left    partial knee  . KNEE ARTHROSCOPY  12/2008  . LYMPH NODE BIOPSY    . PACEMAKER INSERTION    . TONSILLECTOMY      Home Medications:  Allergies as of 04/16/2018      Reactions   Ezetimibe    Simvastatin Other (See Comments)   Reaction:  Gave pt a fever  Fever - temp of 103.   In 2003      Medication List       Accurate as of April 16, 2018  3:26 PM. Always use your most recent med list.        acetaminophen 500 MG tablet Commonly known as:  TYLENOL Take 500 mg by mouth at bedtime.   amLODipine 5 MG tablet Commonly known as:  NORVASC TAKE ONE TABLET BY MOUTH EVERY DAY   aspirin EC 81 MG tablet Take  81 mg by mouth daily.   citalopram 20 MG tablet Commonly known as:  CELEXA Take 1 tablet (20 mg total) by mouth daily.   fluocinonide cream 0.05 % Commonly known as:  LIDEX Apply topically 2 (two) times daily as needed.   ketoconazole 2 % shampoo Commonly known as:  NIZORAL Apply 1 application topically 2 (two) times a week.   levothyroxine 137 MCG tablet Commonly known as:  SYNTHROID, LEVOTHROID TAKE 1 TABLET BY MOUTH ONCE DAILY BEFORE BREAKFAST   methylphenidate 10 MG tablet Commonly known as:  RITALIN Take 1 tablet (10 mg total) by mouth daily.   metoprolol succinate 50 MG 24 hr tablet Commonly known as:  TOPROL-XL TAKE 1 AND 1/2 TABLET (75 MG) BY MOUTH EVERY DAY WITH OR IMMEDIATELY FOLLOWING A MEAL *DO NOT CRUSH*   pentoxifylline 400 MG CR tablet Commonly known as:  TRENTAL Take 1 tablet (400 mg total) by mouth 2 (two)  times daily.   rosuvastatin 20 MG tablet Commonly known as:  CRESTOR TAKE ONE TABLET BY MOUTH AT BEDTIME   sertraline 100 MG tablet Commonly known as:  ZOLOFT Take 1 tablet (100 mg total) by mouth daily.   testosterone cypionate 100 MG/ML injection Commonly known as:  DEPOTESTOTERONE CYPIONATE Inject 1 mL (100 mg total) into the muscle every 14 (fourteen) days.   Turmeric 500 MG Caps Take 2 capsules by mouth daily.   Vitamin D3 25 MCG (1000 UT) Caps Take 1 capsule (1,000 Units total) by mouth daily.   vitamin E 400 UNIT capsule Commonly known as:  vitamin E Take 1 capsule (400 Units total) by mouth 2 (two) times daily.       Allergies:  Allergies  Allergen Reactions  . Ezetimibe   . Simvastatin Other (See Comments)    Reaction:  Gave pt a fever  Fever - temp of 103.   In 2003    Family History: Family History  Problem Relation Age of Onset  . Cancer Mother 75       lung   . Hypertension Mother   . Hypertension Father   . Heart attack Father   . Heart disease Father   . Rheum arthritis Sister   . Cancer Maternal Grandmother     Social History:  reports that he quit smoking about 61 years ago. He has a 3.00 pack-year smoking history. He has never used smokeless tobacco. He reports current alcohol use of about 14.0 standard drinks of alcohol per week. He reports that he does not use drugs.  ROS: UROLOGY Frequent Urination?: No Hard to postpone urination?: No Burning/pain with urination?: No Get up at night to urinate?: Yes Leakage of urine?: No Urine stream starts and stops?: No Trouble starting stream?: No Do you have to strain to urinate?: No Blood in urine?: No Urinary tract infection?: No Sexually transmitted disease?: No Injury to kidneys or bladder?: No Painful intercourse?: No Weak stream?: No Erection problems?: No Penile pain?: No  Gastrointestinal Nausea?: No Vomiting?: No Indigestion/heartburn?: No Diarrhea?: No Constipation?:  No  Constitutional Fever: No Night sweats?: No Weight loss?: No Fatigue?: No  Skin Skin rash/lesions?: No Itching?: No  Eyes Blurred vision?: No Double vision?: No  Ears/Nose/Throat Sore throat?: No Sinus problems?: No  Hematologic/Lymphatic Swollen glands?: No Easy bruising?: No  Cardiovascular Leg swelling?: No Chest pain?: No  Respiratory Cough?: No Shortness of breath?: No  Endocrine Excessive thirst?: No  Musculoskeletal Back pain?: No Joint pain?: Yes  Neurological Headaches?: No Dizziness?: No  Psychologic Depression?: No Anxiety?: No  Physical Exam: BP 106/62 (BP Location: Right Arm, Patient Position: Sitting, Cuff Size: Normal) Comment: Manual  Pulse 97   Ht _0  (1.753 m)   Wt 198 lb (89.8 kg)   BMI 29.24 kg/m   Constitutional:  Alert and oriented, No acute distress. HEENT: [place] AT, moist mucus membranes.  Trachea midline, no masses. Cardiovascular: No clubbing, cyanosis, or edema. Respiratory: Normal respiratory effort, no increased work of breathing. GI: Abdomen is soft, nontender, nondistended, no abdominal masses GU: No CVA tenderness.  Prostate 40 g, smooth without nodules or induration Lymph: No cervical or inguinal lymphadenopathy. Skin: No rashes, bruises or suspicious lesions. Neurologic: Grossly intact, no focal deficits, moving all 4 extremities. Psychiatric: Normal mood and affect.   Assessment & Plan:   78 year old male with symptomatic hypogonadism and a rise in his PSA while on replacement.  DRE today is benign.  I recommended a follow-up PSA in approximately 3 months.  If it is elevated at that time and he desires to stay on TRT would recommend TRUS/prostate biopsy.    Abbie Sons, [place] [address] [place], [place] [address] [phone]

## 2018-04-18 ENCOUNTER — Encounter: Payer: Self-pay | Admitting: Urology

## 2018-04-18 ENCOUNTER — Telehealth: Payer: Self-pay | Admitting: *Deleted

## 2018-04-18 DIAGNOSIS — Z87898 Personal history of other specified conditions: Secondary | ICD-10-CM

## 2018-04-18 DIAGNOSIS — E291 Testicular hypofunction: Secondary | ICD-10-CM | POA: Insufficient documentation

## 2018-04-18 NOTE — Telephone Encounter (Signed)
Follow up call completed to check on the effectiveness of Ritalin for patients level of concentration in preparation for driving.  Patient reports that he has only taken it a couple of times and hasn't driven while on it yet.  He denies having any adverse side effects and reports being able to concentrate and stay at a task much longer than before.  Example he gave was reading.    He will call if he doesn't feel that it is helpful with his driving.

## 2018-05-02 ENCOUNTER — Telehealth: Payer: Self-pay | Admitting: Hematology

## 2018-05-02 NOTE — Telephone Encounter (Signed)
R/s appt per 1/22 sch message - pt is aware of appt date and time

## 2018-05-03 ENCOUNTER — Ambulatory Visit: Payer: Medicare Other | Admitting: Hematology

## 2018-05-03 ENCOUNTER — Ambulatory Visit: Payer: Medicare Other

## 2018-05-03 ENCOUNTER — Other Ambulatory Visit: Payer: Medicare Other

## 2018-05-05 NOTE — Progress Notes (Signed)
Cardiology Office Note  Date:  05/06/2018   ID:  David [name]., DOB 07/14/1940, MRN [id number]  PCP:  [name], MD   Chief Complaint  Patient presents with  . other    Discuss cholesterol labs with pcp. Meds reviewed verbally with pt.    HPI:  [name] is a 78 year old gentleman with past medical history of Former smoker, quit 1960 etoh abuse CAD, CABG 2014 (prior angina sx : syncope) Cardiomyopathy, resolved Syncope, 3rd degree heart block, pacer, upgrade to [facility]. Jude ICD CRT-P 02/25/13 [place]  osa on CPAP metastatic melanoma stage IV, brain surgery 1 -2 years ago Memory issues, recent events aphasia, not able to communicate much. Referred by pulmonary, Dr. Juanell Fairly  for SOB, history of cardiomyopathy  Problems with low testosterone level Followed by urology Better on the supplement,   Symptoms of tiredness, malaise and fatigue have improved on supplement Elevated PSA in the past Scheduled for repeat PSA 3 months  He denies any shortness of breath or chest pain Helps to take care of his wife who has dementia Reports his memory is stable but has some trouble  No regular exercise program  In the past has been on CPAP Feels he does better on his machine   history of melanoma History of brain surgery,  EKG personally reviewed by myself on todays visit Shows paced rhythm rate 75 bpm no significant change compared to previous EKGs  Previous records reviewed PET scan 08/4006 hypermetabolic 6.7YP lingular nodule, and a 31mm right upper lobe lung nodule, left subclavicular nodes, and bone metastasis in the proximal right humerus and right femur.  Mets to head  Echo 09/29/2016 Left ventricle: The cavity size was normal. Wall thickness was   increased in a pattern of mild LVH. Systolic function was normal.   The estimated ejection fraction was in the range of 60% to 65%.   Wall motion was normal; there were no regional wall motion   abnormalities.  Doppler parameters are consistent with abnormal   left ventricular relaxation (grade 1 diastolic dysfunction).  PMH:   has a past medical history of Anginal pain ([place]), Arthritis, Benign familial hematuria, BPH (benign prostatic hypertrophy), Brain cancer ([place]), Coronary artery disease, GERD (gastroesophageal reflux disease), High cholesterol, Hypertension, Hypothyroidism, Intracerebral hemorrhage ([place]), Melanoma ([place]), Metastatic melanoma to lung ([place]), Mood disorder ([place]), Myocardial infarction [facility]), Pacemaker, Rectal bleed, Renal disorder, Skin cancer, Sleep apnea, and Stroke ([place]).  PSH:    Past Surgical History:  Procedure Laterality Date  . APPLICATION OF CRANIAL NAVIGATION N/A 10/15/2015   Procedure: APPLICATION OF CRANIAL NAVIGATION;  Surgeon: Erline Levine, MD;  Location: [place] [facility]UR[facility] ORS;  Service: Neurosurgery;  Laterality: N/A;  . CARDIAC CATHETERIZATION  2013  . CARDIAC SURGERY     bypass X 2  . COLONOSCOPY WITH PROPOFOL N/A 05/08/2016   diverticulosis, int hem, no f/u needed [name])  . CORONARY ARTERY BYPASS GRAFT  2013   LIMA-LAD, SVG-PDA 03/27/11 (Dr. Francee Gentile)  . CRANIOTOMY N/A 10/15/2015   Procedure: LEFT FRONTAL CRANIOTOMY TUMOR EXCISION with Curve;  Surgeon: Erline Levine, MD;  Location: [place] NEURO ORS;  Service: Neurosurgery;  Laterality: N/A;  CRANIOTOMY TUMOR EXCISION  . EYE SURGERY    . FOOT SURGERY  08/2010  . JOINT REPLACEMENT Left    partial knee  . KNEE ARTHROSCOPY  12/2008  . LYMPH NODE BIOPSY    . PACEMAKER INSERTION    . TONSILLECTOMY      Current Outpatient Medications  Medication Sig Dispense  Refill  . acetaminophen (TYLENOL) 500 MG tablet Take 500 mg by mouth at bedtime.    Marland Kitchen amLODipine (NORVASC) 5 MG tablet TAKE ONE TABLET BY MOUTH EVERY DAY 32 tablet 11  . aspirin EC 81 MG tablet Take 81 mg by mouth daily.    . Cholecalciferol (VITAMIN D3) 1000 units CAPS Take 1 capsule (1,000 Units total) by mouth daily. 30 capsule   . citalopram (CELEXA) 20 MG tablet Take 1  tablet (20 mg total) by mouth daily. 30 tablet 3  . fluocinonide cream (LIDEX) 0.05 % Apply topically 2 (two) times daily as needed. 30 g 1  . ketoconazole (NIZORAL) 2 % shampoo Apply 1 application topically 2 (two) times a week. 120 mL 1  . levothyroxine (SYNTHROID, LEVOTHROID) 137 MCG tablet TAKE 1 TABLET BY MOUTH ONCE DAILY BEFORE BREAKFAST 30 tablet 11  . methylphenidate (RITALIN) 10 MG tablet Take 1 tablet (10 mg total) by mouth daily. 30 tablet 0  . metoprolol succinate (TOPROL-XL) 50 MG 24 hr tablet TAKE 1 AND 1/2 TABLET (75 MG) BY MOUTH EVERY DAY WITH OR IMMEDIATELY FOLLOWING A MEAL *DO NOT CRUSH* 50 tablet 11  . pentoxifylline (TRENTAL) 400 MG CR tablet Take 1 tablet (400 mg total) by mouth 2 (two) times daily. 60 tablet 6  . rosuvastatin (CRESTOR) 20 MG tablet TAKE ONE TABLET BY MOUTH AT BEDTIME 30 tablet 8  . sertraline (ZOLOFT) 100 MG tablet Take 1 tablet (100 mg total) by mouth daily.    Marland Kitchen testosterone cypionate (DEPOTESTOTERONE CYPIONATE) 100 MG/ML injection Inject 1 mL (100 mg total) into the muscle every 14 (fourteen) days. 10 mL 0  . Turmeric 500 MG CAPS Take 2 capsules by mouth daily.    . vitamin E (VITAMIN E) 400 UNIT capsule Take 1 capsule (400 Units total) by mouth 2 (two) times daily. 60 capsule 6   No current facility-administered medications for this visit.    Facility-Administered Medications Ordered in Other Visits  Medication Dose Route Frequency Provider Last Rate Last Dose  . 0.9 %  sodium chloride infusion  1,000 mL Intravenous Once [name], MD        Allergies:   Ezetimibe and Simvastatin   Social History:  The patient  reports that he quit smoking about 61 years ago. He has a 3.00 pack-year smoking history. He has never used smokeless tobacco. He reports current alcohol use of about 14.0 standard drinks of alcohol per week. He reports that he does not use drugs.   Family History:   family history includes Cancer in his maternal grandmother; Cancer (age of  onset: 86) in his mother; Heart attack in his father; Heart disease in his father; Hypertension in his father and mother; Rheum arthritis in his sister.    Review of Systems: Review of Systems  Constitutional: Negative.   Respiratory: Negative.   Cardiovascular: Negative.   Gastrointestinal: Negative.   Musculoskeletal: Negative.        Gait instability  Neurological: Negative.   Psychiatric/Behavioral: Positive for memory loss.  All other systems reviewed and are negative.    PHYSICAL EXAM: VS:  BP 110/80 (BP Location: Left Arm, Patient Position: Sitting, Cuff Size: Normal)   Pulse 75   Ht 5' 9.5" (1.765 m)   Wt 206 lb 4 oz (93.6 kg)   BMI 30.02 kg/m  , BMI Body mass index is 30.02 kg/m. Constitutional:   No distress.  HENT:  Head: Grossly normal Eyes:  no discharge. No scleral icterus.  Neck:  No JVD, no carotid bruits  Cardiovascular: Regular rate and rhythm, no murmurs appreciated Pulmonary/Chest: Clear to auscultation bilaterally, no wheezes or rails Abdominal: Soft.  no distension.  no tenderness.  Musculoskeletal: Normal range of motion Neurological:  normal muscle tone. Coordination normal. No atrophy Skin: Skin warm and dry Psychiatric: normal affect, pleasant, memory issues   Recent Labs: 01/30/2018: ALT 19; Hemoglobin 14.3; Platelets 143; TSH 2.141 03/21/2018: BUN 37; Creatinine, Ser 1.56; Potassium 4.8; Sodium 139    Lipid Panel Lab Results  Component Value Date   CHOL 165 03/21/2018   HDL 40.70 03/21/2018   LDLCALC 135 (H) 08/24/2015   TRIG 352.0 (H) 03/21/2018  LDL 81   Wt Readings from Last 3 Encounters:  05/06/18 206 lb 4 oz (93.6 kg)  04/16/18 198 lb (89.8 kg)  04/01/18 207 lb 11.2 oz (94.2 kg)       ASSESSMENT AND PLAN:  Atherosclerosis of native coronary artery of native heart with stable angina pectoris ([place]) - Plan: EKG 12-Lead Denies any anginal symptoms, no further testing at this time  Hx of CABG - Plan: EKG 12-Lead done well  since bypass surgery 6 years ago No new symptoms concerning for angina Cholesterol slightly above goal but trending downward He is eating less  Ischemic cardiomyopathy - Plan: EKG 12-Lead Improved numbers, echocardiogram EF 60-65% No medication changes made  Biventricular ICD (implantable cardioverter-defibrillator) in place Followed by Dr. Caryl Comes, EP Last seen May 2019  Mixed hyperlipidemia Reports having problem in the past on Zetia Recommend he continue Crestor 20   Benign essential HTN Stable, no changes made Weight trending downward, will need to monitor for low blood pressure  Secondary malignant melanoma of lung, unspecified laterality ([place]) Brain surgery, Reports PET scans negative  Disposition:   F/U  12 months   Total encounter time more than 25 minutes  Greater than 50% was spent in counseling and coordination of care with the patient    Orders Placed This Encounter  Procedures  . EKG 12-Lead     Signed, Esmond Plants, M.D., Ph.D. 05/06/2018  [place], [place]

## 2018-05-06 ENCOUNTER — Encounter: Payer: Self-pay | Admitting: Cardiovascular Disease

## 2018-05-06 ENCOUNTER — Ambulatory Visit (INDEPENDENT_AMBULATORY_CARE_PROVIDER_SITE_OTHER): Payer: Medicare Other | Admitting: Cardiovascular Disease

## 2018-05-06 VITALS — BP 110/80 | HR 75 | Ht 69.5 in | Wt 206.2 lb

## 2018-05-06 DIAGNOSIS — Z7289 Other problems related to lifestyle: Secondary | ICD-10-CM

## 2018-05-06 DIAGNOSIS — E782 Mixed hyperlipidemia: Secondary | ICD-10-CM | POA: Diagnosis not present

## 2018-05-06 DIAGNOSIS — I619 Nontraumatic intracerebral hemorrhage, unspecified: Secondary | ICD-10-CM | POA: Diagnosis not present

## 2018-05-06 DIAGNOSIS — I255 Ischemic cardiomyopathy: Secondary | ICD-10-CM | POA: Diagnosis not present

## 2018-05-06 DIAGNOSIS — N183 Chronic kidney disease, stage 3 unspecified: Secondary | ICD-10-CM

## 2018-05-06 DIAGNOSIS — I25118 Atherosclerotic heart disease of native coronary artery with other forms of angina pectoris: Secondary | ICD-10-CM

## 2018-05-06 DIAGNOSIS — Z951 Presence of aortocoronary bypass graft: Secondary | ICD-10-CM | POA: Diagnosis not present

## 2018-05-06 DIAGNOSIS — Z789 Other specified health status: Secondary | ICD-10-CM

## 2018-05-06 DIAGNOSIS — R4189 Other symptoms and signs involving cognitive functions and awareness: Secondary | ICD-10-CM | POA: Diagnosis not present

## 2018-05-06 NOTE — Patient Instructions (Signed)

## 2018-05-07 ENCOUNTER — Other Ambulatory Visit: Payer: Self-pay | Admitting: Hematology

## 2018-05-07 DIAGNOSIS — R21 Rash and other nonspecific skin eruption: Secondary | ICD-10-CM

## 2018-05-07 MED ORDER — FLUOCINONIDE 0.05 % EX CREA: TOPICAL_CREAM | Freq: Two times a day (BID) | CUTANEOUS | 0 refills | Status: DC | PRN

## 2018-05-15 ENCOUNTER — Other Ambulatory Visit: Payer: Self-pay | Admitting: Family Medicine

## 2018-05-15 NOTE — Progress Notes (Signed)
[place]   Telephone:(336) [phone] Fax:(336) [phone]   Clinic Follow up Note   Patient Care Team: [name], MD as PCP - General (Family Medicine)  Date of Service:  05/17/2018  CHIEF COMPLAINT: Follow-up metastatic melanoma  SUMMARY OF ONCOLOGIC HISTORY: Oncology History   Metastatic melanoma [facility])   Staging form: Melanoma of the Skin, AJCC 7th Edition     Clinical stage from 09/21/2015: Stage IV [place], [place], [place]) - Signed by [name], MD on 10/09/2015       Malignant melanoma ([place])   09/16/2015 Imaging    PET scan showed a hypermetabolic 0.7MA lingular nodule, and a 21m right upper lobe lung nodule, left subclavicular nodes, and bone metastasis in the proximal right humerus and right femur.     09/21/2015 Initial Diagnosis    Metastatic melanoma ([place]    09/21/2015 Initial Biopsy    Left subclavicular lymph node biopsy showed prostatic of melanoma    09/21/2015 Miscellaneous    BRAF V600K mutation (+)     10/04/2015 Imaging    CT head with without contrast showed a 2.0 x 1.6 x 1.7 cm superficial left frontal hyperdense lesion, with postcontrast enhancement, lying within the previous hemorrhagic area, concerning for solitary melanoma metastasis    10/06/2015 - 09/06/2016 Chemotherapy    Nivolumab 2431mevery 2 weeks, held due to his dexamethasone for radionecrosis     10/13/2015 - 10/13/2015 Radiation Therapy    10/13/2015 Preop SRS treatment: 14 Gy  to the left frontal lesion in 1 fraction    10/15/2015 Surgery    left front craniotomy and tumor excision     10/15/2015 Pathology Results    left frontal brain mass excision showed metastatic melanoma, 1.5X1.5X0.6cm    01/13/2016 Imaging    CT head without and with contrast showed ill-defined enhancement and dural thickening of the left frontal resection cavity may represent a post treatment changes or residual neoplasm. A new 6 mm hypodensity in the right cerebellar hemisphere with uncertainty enhancement may  represent a metastasis or small brain parenchymal hemorrhage.     01/27/2016 - 01/27/2016 Radiation Therapy    01/27/16 SRS Treatment:  20 Gy in 1 fraction to a 6 mm right cerebellar brain met     03/29/2016 PET scan    PET 03/29/2016 IMPRESSION: 1. Overall there has been an interval response to therapy. The index lesions sided on previous exam it are less hypermetabolic when compared with the previous exam. 2. There is an intramuscular lesion within the right masseter which has increased FDG uptake compared with previous exam. 3. No new sites of disease identified. 4. Aortic atherosclerosis and infrarenal abdominal aortic aneurysm. Recommend followup by ultrasound in 3 years. This recommendation follows ACR consensus guidelines: White Paper of the ACR Incidental Findings Committee II on Vascular Findings. J Joellyn Ruedadiol 2013; 10:789-794    04/20/2016 Imaging    CT Head w wo contrast 1. Mild patchy enhancement at the left anterior frontal lobe treatment site (series 11, image 39) without mass effect and stable surrounding white matter hypodensity without strong evidence of acute vasogenic edema. Recommend continued CT surveillance. 2. No new metastatic disease or new intracranial abnormality identified. 3. Probable benign 14 mm left parietal bone lucent lesion, unchanged since May 2017.    05/06/2016 - 05/08/2016 Hospital Admission    Pt was admitted for rectal bleeding for one day. Breathing spontaneously resolved, hemoglobin dropped from 15 to 10.5, did not require blood transfusion. Colonoscopy showed no  active bleeding, except diverticulosis and hemorrhoid. He was discharged to home.    05/08/2016 Procedure    Colonoscopy  - Stool in the ascending colon. Fluid aspiration performed. - Diverticulosis in the entire examined colon. - Non-bleeding internal hemorrhoids. - The examination was otherwise normal on direct and retroflexion views.    05/29/2016 Imaging    PET Image  Restaging IMPRESSION: 1. Stable exam with no evidence progression. 2. Photopenia in the LEFT frontal lobe at prior surgical site. 3. Stable mildly hypermetabolic RIGHT paratracheal node. This may represent a thyroid nodule. 4. Stable size of minimally metabolic lingular pulmonary nodule. 5. Stable skeletal lesion of the RIGHT iliac crest 6. No cutaneous metabolic lesion.    07/20/2016 Imaging    CT Head W WO Contrast IMPRESSION: Pronounced change in the left frontal region. Much more regional vasogenic edema. Relatively stable appearance of the abnormal enhancement extending from the lateral margin of the frontal horn of left lateral ventricle to the frontal brain surface. New region of abnormal enhancement surrounding the frontal horn of the left lateral ventricle measuring approximately 3.2 cm. The differential diagnosis is recurrent tumor versus radiation necrosis. The patient is reportedly not an MRI candidate. This area is large enough that CT perfusion could possibly make a reasonable differentiation.    09/02/2016 Imaging    CT Head 09/02/16 IMPRESSION: Somewhat mixed pattern of slight posterior progression of pleomorphic enhancement surrounding the LEFT lateral ventricle although no significant mass effect, and reduced vasogenic edema compared with the scan in April.    09/27/2016 - 11/02/2016 Chemotherapy    Start dabrafenib and trametinib 09/27/16, stopped due to severe fatigue      10/05/2016 Imaging    CT Head w & w/o contrast  IMPRESSION: 1. Decreased size of the enhancing area within the left frontal lobe treatment site. Surrounding edema has also decreased. The findings support continued evolution of radiation necrosis. 2. No new enhancing lesions.    10/18/2016 PET scan    IMPRESSION: 1. New focal hypermetabolism in the proximal sigmoid colon with associated focal colonic wall thickening and mild pericolonic fat stranding at the site of a proximal sigmoid  diverticulum, favored to represent mild acute sigmoid diverticulitis. Metastatic disease is considered less likely. Clinical correlation is necessary. Consider appropriate antibiotic therapy, as clinically warranted. Attention to this region recommended on future PET-CT follow-up. 2. Otherwise complete metabolic response with no evidence of hypermetabolic metastatic disease. Lingular pulmonary nodule is slightly decreased in size and demonstrates no significant metabolism. 3. No residual hypermetabolism in the right thyroid lobe nodule, which is stable in size. 4. Aortic Atherosclerosis (ICD10-I70.0). Stable 3.1 cm infrarenal abdominal aortic aneurysm. Aortic aneurysm NOS (ICD10-I71.9). Recommend followup by ultrasound in 3 years. This recommendation follows ACR consensus guidelines: White Paper of the ACR Incidental Findings Committee II on Vascular Findings. Joellyn Rued Radiol 2013; 24:097-353.    11/01/2016 - 11/04/2016 Hospital Admission    Patient admitted to the hospital due to hypercalcemia where he received IV fluids and pamidronate    12/13/2016 PET scan    PET 12/13/16 IMPRESSION: 1. No findings to suggest metastatic disease on today's examination. 2. Previously noted hypermetabolism in the sigmoid colon has resolved, presumably indicative of a focus of diverticulitis on the prior study. Today's study does demonstrate relatively diffuse hypermetabolism throughout the cecum, ascending colon and proximal transverse colon where there is some mild mural thickening, favored to reflect mild chronic inflammation related to underlying diverticular disease. No definite inflammatory changes on the CT portion  of the examination to strongly suggest an active acute diverticulitis at this time. No discrete lesion to suggest neoplasm. 3. Previously noted 8 mm lingular nodule is stable in size and known remains non hypermetabolic. This is nonspecific but reassuring. Continued attention on  future followup studies is recommended. 4. Aortic atherosclerosis, in addition to left main and 3 vessel coronary artery disease. Status post median sternotomy for CABG including LIMA to the LAD. In addition, there is mild fusiform aneurysmal dilatation of the infrarenal abdominal aorta (3.1 cm in diameter) as well as aneurysmal dilatation of the left common iliac    12/18/2016 - 12/31/2017 Chemotherapy    Restarted nivolamab 261m every 2 weeks, with tapering dose dexamethasone      04/18/2017 Imaging    PET Scan IMPRESSION: 1. No hypermetabolic lesion to suggest active malignancy. 2. Stable 8 mm lingular nodule is not currently hypermetabolic but merit surveillance. Infrarenal abdominal aortic aneurysm 3.1 cm in diameter. Recommend followup by [facility] 3 years. 3. Other imaging findings of potential clinical significance: Postoperative findings in the left frontal lobe. Right renal cyst. Aortic Atherosclerosis (ICD10-I70.0). Osteoarthritis. Lumbar scoliosis. Left knee effusion. Colonic diverticulosis.     10/10/2017 PET scan    IMPRESSION: 1. Small focus of FDG accumulation in the subcutaneous fat of the right gluteal region, nonspecific. Attention on follow-up suggested. 2. Otherwise stable exam without new or progressive hypermetabolic disease in the interval since prior PET-CT. 3. Stable 8 mm nodule in the lingula. No evidence for hypermetabolism on PET imaging. 4.  Aortic Atherosclerois (ICD10-170.0)     01/29/2018 PET scan    IMPRESSION: 1. Small focus of hypermetabolism in the lower left paratracheal neck, which appears to localize to the posterior left thyroid lobe, without discrete thyroid nodule on the CT images, decreased in metabolism since 10/10/2017 PET-CT. This finding is nonspecific and may represent a hypermetabolic thyroid nodule. Consider thyroid ultrasound correlation. 2. No new hypermetabolic findings suspicious for metastatic disease. Previously described low level  metabolism in the subcutaneous right gluteal region has resolved, compatible with resolved inflammatory focus. No evidence of recurrent hypermetabolism within the stable lingular pulmonary nodule. No evidence of recurrent osseous hypermetabolism. 3. Stable infrarenal 3.4 cm Abdominal Aortic Aneurysm (ICD10-I71.9). Recommend follow-up aortic ultrasound in 3 years. This recommendation follows ACR consensus guidelines: White Paper of the ACR Incidental Findings Committee II on Vascular Findings. J Am Coll Radiol 2013;; 70:350-093 4.  Aortic Atherosclerosis (ICD10-I70.0).      CURRENT THERAPY:  Observation   PREVIOUS THERAPY: -Monthly Xgeva starting 02/2016, changed to every 3 months on 01/30/2018. Stopped on 05/17/2018 -Restarted nivolamab on 12/18/2016 and changed to every 4 weeks on 01/30/2017, changed back to every 2 weeks due to fatigue on 04/23/17. Last treatment was on 12/31/2017   INTERVAL HISTORY:  CDelmos [name] is here for a follow up of metastatic melanoma. He presents to the clinic today by himself. He notes he is doing well overall. He last saw Dr. VMickeal Skinnerlast month and had follow up MRI. He was started on Ritalin for when he needs to be alert to drive. He has pill box at home to help him remember medications. He notes he still has memory issues. He notes he does not have his driver's license back yet. He notes he will drive golf cart around neighborhood when it will get warmer. His sister can drive him but he does not want to bother her by asking.  He fixes breakfast for herself and has someone to do  most of the cleaning and cooking. He notes he is eating adequately still.  He notes he remains slightly off balance. He has not started using cane regularly yet.      REVIEW OF SYSTEMS:   Constitutional: Denies fevers, chills or abnormal weight loss Eyes: Denies blurriness of vision Ears, nose, mouth, throat, and face: Denies mucositis or sore throat Respiratory: Denies  cough, dyspnea or wheezes Cardiovascular: Denies palpitation, chest discomfort or lower extremity swelling Gastrointestinal:  Denies nausea, heartburn or change in bowel habits Skin: Denies abnormal skin rashes Lymphatics: Denies new lymphadenopathy or easy bruising Neurological:Denies numbness, tingling or new weaknesses (+) Stable memory (+0 Mild unbalance  Behavioral/Psych: Mood is stable, no new changes  All other systems were reviewed with the patient and are negative.  MEDICAL HISTORY:  Past Medical History:  Diagnosis Date  . Anginal pain ([place])   . Arthritis    mostly hands  . Benign familial hematuria   . BPH (benign prostatic hypertrophy)   . Brain cancer ([place])    melanoma with brain met  . Coronary artery disease   . GERD (gastroesophageal reflux disease)   . High cholesterol   . Hypertension   . Hypothyroidism   . Intracerebral hemorrhage ([place])   . Melanoma ([place])   . Metastatic melanoma to lung ([place])   . Mood disorder ([place])   . Myocardial infarction ([place])   . Pacemaker    due to syncope, 3rd degree HB (upgrade to [facility]. Jude CRT-P 02/25/13 (Dr. Uvaldo Rising)  . Rectal bleed    due to NSAIDS  . Renal disorder   . Skin cancer    melanoma  . Sleep apnea    uses CPAP  . Stroke [facility])     SURGICAL HISTORY: Past Surgical History:  Procedure Laterality Date  . APPLICATION OF CRANIAL NAVIGATION N/A 10/15/2015   Procedure: APPLICATION OF CRANIAL NAVIGATION;  Surgeon: Erline Levine, MD;  Location: [place] [facility]UR[facility] ORS;  Service: Neurosurgery;  Laterality: N/A;  . CARDIAC CATHETERIZATION  2013  . CARDIAC SURGERY     bypass X 2  . COLONOSCOPY WITH PROPOFOL N/A 05/08/2016   diverticulosis, int hem, no f/u needed [name])  . CORONARY ARTERY BYPASS GRAFT  2013   LIMA-LAD, SVG-PDA 03/27/11 (Dr. Francee Gentile)  . CRANIOTOMY N/A 10/15/2015   Procedure: LEFT FRONTAL CRANIOTOMY TUMOR EXCISION with Curve;  Surgeon: Erline Levine, MD;  Location: [place] [facility]URO ORS;  Service: Neurosurgery;  Laterality: N/A;   CRANIOTOMY TUMOR EXCISION  . EYE SURGERY    . FOOT SURGERY  08/2010  . JOINT REPLACEMENT Left    partial knee  . KNEE ARTHROSCOPY  12/2008  . LYMPH NODE BIOPSY    . PACEMAKER INSERTION    . TONSILLECTOMY      I have reviewed the social history and family history with the patient and they are unchanged from previous note.  ALLERGIES:  is allergic to ezetimibe and simvastatin.  MEDICATIONS:  Current Outpatient Medications  Medication Sig Dispense Refill  . acetaminophen (TYLENOL) 500 MG tablet Take 500 mg by mouth at bedtime.    Marland Kitchen amLODipine (NORVASC) 5 MG tablet TAKE ONE TABLET BY MOUTH EVERY DAY 32 tablet 11  . aspirin EC 81 MG tablet Take 81 mg by mouth daily.    . Cholecalciferol (VITAMIN D3) 1000 units CAPS Take 1 capsule (1,000 Units total) by mouth daily. 30 capsule   . citalopram (CELEXA) 20 MG tablet Take 1 tablet (20 mg total) by mouth daily. 30 tablet 3  .  fluocinonide cream (LIDEX) 0.05 % Apply topically 2 (two) times daily as needed. 30 g 0  . ketoconazole (NIZORAL) 2 % shampoo Apply 1 application topically 2 (two) times a week. 120 mL 1  . levothyroxine (SYNTHROID, LEVOTHROID) 137 MCG tablet TAKE 1 TABLET BY MOUTH ONCE DAILY BEFORE BREAKFAST 30 tablet 11  . methylphenidate (RITALIN) 10 MG tablet Take 1 tablet (10 mg total) by mouth daily. 30 tablet 0  . metoprolol succinate (TOPROL-XL) 50 MG 24 hr tablet TAKE 1 AND 1/2 TABLET (75 MG) BY MOUTH EVERY DAY WITH OR IMMEDIATELY FOLLOWING A MEAL *DO NOT CRUSH* 50 tablet 11  . pentoxifylline (TRENTAL) 400 MG CR tablet Take 1 tablet (400 mg total) by mouth 2 (two) times daily. 60 tablet 6  . rosuvastatin (CRESTOR) 20 MG tablet TAKE ONE TABLET BY MOUTH AT BEDTIME 30 tablet 8  . testosterone cypionate (DEPOTESTOTERONE CYPIONATE) 100 MG/ML injection Inject 1 mL (100 mg total) into the muscle every 14 (fourteen) days. 10 mL 0  . Turmeric 500 MG CAPS Take 2 capsules by mouth daily.    . vitamin E (VITAMIN E) 400 UNIT capsule Take 1  capsule (400 Units total) by mouth 2 (two) times daily. 60 capsule 6   No current facility-administered medications for this visit.    Facility-Administered Medications Ordered in Other Visits  Medication Dose Route Frequency Provider Last Rate Last Dose  . 0.9 %  sodium chloride infusion  1,000 mL Intravenous Once [name], MD        PHYSICAL EXAMINATION: ECOG PERFORMANCE STATUS: 2 - Symptomatic, <50% confined to bed  Vitals:   05/17/18 0946  BP: 122/81  Pulse: 80  Resp: 16  Temp: 98 F (36.7 C)  SpO2: 93%   Filed Weights   05/17/18 0946  Weight: 206 lb 8 oz (93.7 kg)    GENERAL:alert, no distress and comfortable SKIN: skin color, texture, turgor are normal, no rashes or significant lesions EYES: normal, Conjunctiva are pink and non-injected, sclera clear OROPHARYNX:no exudate, no erythema and lips, buccal mucosa, and tongue normal  NECK: supple, thyroid normal size, non-tender, without nodularity LYMPH:  no palpable lymphadenopathy in the cervical, axillary or inguinal LUNGS: clear to auscultation and percussion with normal breathing effort HEART: regular rate & rhythm and no murmurs and no lower extremity edema ABDOMEN:abdomen soft, non-tender and normal bowel sounds Musculoskeletal:no cyanosis of digits and no clubbing  NEURO: alert & oriented x 3 with fluent speech, no focal motor/sensory deficits  LABORATORY DATA:  I have reviewed the data as listed CBC Latest Ref Rng & Units 05/17/2018 01/30/2018 12/31/2017  WBC 4.0 - 10.5 K/uL 5.7 4.4 4.2  Hemoglobin 13.0 - 17.0 g/dL 14.0 14.3 15.2  Hematocrit 39.0 - 52.0 % 40.9 42.4 45.3  Platelets 150 - 400 K/uL 155 143(L) 157     CMP Latest Ref Rng & Units 05/17/2018 03/21/2018 01/30/2018  Glucose 70 - 99 mg/dL 101(H) 84 161(H)  BUN 8 - 23 mg/dL 36(H) 37(H) 34(H)  Creatinine 0.61 - 1.24 mg/dL 1.66(H) 1.56(H) 1.70(H)  Sodium 135 - 145 mmol/L 142 139 141  Potassium 3.5 - 5.1 mmol/L 4.7 4.8 4.8  Chloride 98 - 111 mmol/L 106  102 102  CO2 22 - 32 mmol/L 26 30 28   Calcium 8.9 - 10.3 mg/dL 11.2(H) 9.6 10.3  Total Protein 6.5 - 8.1 g/dL 6.8 - 7.0  Total Bilirubin 0.3 - 1.2 mg/dL 0.8 - 0.6  Alkaline Phos 38 - 126 U/L 52 - 59  AST 15 -  41 U/L 17 - 22  ALT 0 - 44 U/L 14 - 19      RADIOGRAPHIC STUDIES: I have personally reviewed the radiological images as listed and agreed with the findings in the report. No results found.   ASSESSMENT & PLAN:  Nabor Thomann. is a 78 y.o. male with   1. Metastatic melanoma to lung, node, bone and brain, stage IV, BRAF V600K mutation (+), NED now  -He was diagnosed in 09/2015. He has been treated with SRS treatments left front craniotomy and tumor excision, dabrafenib and trametinib and Nivolumab.  -Due to his NED on scan, I have stopped Nivolumab after 2 years of treatment in 12/2017. He has remained NED since.  -He has been on Xgeva monthly for his bone metastasis since 02/2016. Given cancer is in remission, his bone mets are stable and has been on Xgeva for over 2 years, I was planning to stop his Injections. However he still has hypercalcemia, will continue it every 3 months  -He is clinically doing well, stable, able to do self-care at home. Physical exam unremarkable.  There is no clinical concern for disease progression -Labs reviewed, CBC overall WNL. CMP and TSH still pending.  -Next PET scan before next visit  -F/u in 3 months   2. Bone metastasis  -He has been on Xgeva monthly for 2 years, restaging PET scan has showed no hypermetabolic bone metastasis  -continue Xgeva injections every 3 months  -I encouraged him take VitD daily.    3. History of hemorrhagic stroke and brain metastasis  -He has recovered very well -History post SRS and surgical resection of his solitary brain metastasis  -He previously received a course of dexamethasone for radionecrosis -He went to ED for slurred speech and vomiting and a Head CT was taken 09/02/16 which showed  radionecrosis -I reviewed his restaging CT had with our neuro-oncologist Dr. Mickeal Skinner, his radionecrosis has improved, no other new lesions. He was also seen by Dr. Mickeal Skinner who feels he is recovering well from neurological standpoint -I tapered his steroids until he stopped altogether by 01/29/17 -He will continue to f/u with Dr. Mickeal Skinner.  4. Memory loss and cognitive dysfunction -He has developed a worsening memory loss, mild aphasia, cognitive dysfunction since May 2018, likely secondary to the radionecrosis of his treated brain metastasis, and previous stroke  -CT Head 11/27/2016 showed continued evolution of post radiation changes in the left frontal lobe with further decrease in the area of contrast enhancement and adjacent edema. No new contrast-enhancing lesions.  -continue PT/OT  -Latest CT head in 02/2018 is stable -He will continue to follow up with Dr. Mickeal Skinner.   5. Hypercalcemia and CKD stage III  -Possibly related to his underlying malignancy (bone mets) and missing one dose of Xgeva. He has restarted Xgeva monthly on 11/10/16 and will continue every 3 months    -He has been advised to reduce dairy in diet and increase water intake and avoid NSAIDs   6. Fatigue, hypothyroidism and hypotestosteronemia -He has baseline hypothyroidism and hypotestosteronemia -His fatigue has much improved since he restarted testosterone injection by his primary care physician -His TSH level have been slightly fluctuating, possibly partially related to Nivolumab -He will follow-up with his primary care physician for his Synthroid dose adjustment    7. HTN   -He is currently on amlodipine and Toprolol, follow-up of his primary care physician -HTN controlled with normal BP lately   8. Depression  -He is on Zoloft 132m daily,  management by his PCP  -Due to his recent insomnia, he has reduced his Zoloft to 25 mg daily by himself, he reports to be less motivated to do physical therapy after the  change, I encouraged him to increase Zoloft to 50 mg daily, and consider melatonin at night for insomnia. -His mood is doing well    9. Goal of care discussion  -We previously discussed the incurable nature of his metastatic melanoma, and the overall poor prognosis, especially if he does not have good response to chemotherapy or progress on chemo -The patient understands the goal of care is palliative. -He is full code, recent PET showed NED   10. CAD -history of 2 double vessel bypass -He has pace maker -Continue to follow up with, cardiologist, Dr. Haroldine Laws    Plan -lab reviewed, he has recurrent mild hypercalcemia, will continue Xgeva injection today and every 3 months -Lab and f/u in 3 months with PET a few days before     No problem-specific Assessment & Plan notes found for this encounter.   Orders Placed This Encounter  Procedures  . NM PET Image Restag (PS) Skull Base To Thigh    Standing Status:   Future    Standing Expiration Date:   05/17/2019    Order Specific Question:   If indicated for the ordered procedure, I authorize the administration of a radiopharmaceutical per Radiology protocol    Answer:   Yes    Order Specific Question:   Preferred imaging location?    Answer:   [facility]    Order Specific Question:   Radiology Contrast Protocol - do NOT remove file path    Answer:   [email or web address]   All questions were answered. The patient knows to call the clinic with any problems, questions or concerns. No barriers to learning was detected. I spent 20 minutes counseling the patient face to face. The total time spent in the appointment was 25 minutes and more than 50% was on counseling and review of test results     [name], MD 05/17/2018   I, Joslyn Devon, am acting as scribe for [name], MD.   I have reviewed the above documentation for accuracy and completeness, and I agree with the above.

## 2018-05-17 ENCOUNTER — Telehealth: Payer: Self-pay | Admitting: Hematology

## 2018-05-17 ENCOUNTER — Encounter: Payer: Self-pay | Admitting: Hematology

## 2018-05-17 ENCOUNTER — Inpatient Hospital Stay: Payer: Medicare Other | Attending: Hematology

## 2018-05-17 ENCOUNTER — Inpatient Hospital Stay (HOSPITAL_BASED_OUTPATIENT_CLINIC_OR_DEPARTMENT_OTHER): Payer: Medicare Other | Admitting: Hematology

## 2018-05-17 ENCOUNTER — Inpatient Hospital Stay: Payer: Medicare Other

## 2018-05-17 VITALS — BP 122/81 | HR 80 | Temp 98.0°F | Resp 16 | Ht 70.0 in | Wt 206.5 lb

## 2018-05-17 DIAGNOSIS — C439 Malignant melanoma of skin, unspecified: Secondary | ICD-10-CM | POA: Diagnosis not present

## 2018-05-17 DIAGNOSIS — F329 Major depressive disorder, single episode, unspecified: Secondary | ICD-10-CM | POA: Diagnosis not present

## 2018-05-17 DIAGNOSIS — N183 Chronic kidney disease, stage 3 unspecified: Secondary | ICD-10-CM

## 2018-05-17 DIAGNOSIS — C7931 Secondary malignant neoplasm of brain: Secondary | ICD-10-CM

## 2018-05-17 DIAGNOSIS — I129 Hypertensive chronic kidney disease with stage 1 through stage 4 chronic kidney disease, or unspecified chronic kidney disease: Secondary | ICD-10-CM | POA: Diagnosis not present

## 2018-05-17 DIAGNOSIS — Z7982 Long term (current) use of aspirin: Secondary | ICD-10-CM | POA: Insufficient documentation

## 2018-05-17 DIAGNOSIS — G4700 Insomnia, unspecified: Secondary | ICD-10-CM | POA: Diagnosis not present

## 2018-05-17 DIAGNOSIS — Z79899 Other long term (current) drug therapy: Secondary | ICD-10-CM | POA: Diagnosis not present

## 2018-05-17 DIAGNOSIS — Z923 Personal history of irradiation: Secondary | ICD-10-CM | POA: Diagnosis not present

## 2018-05-17 DIAGNOSIS — I10 Essential (primary) hypertension: Secondary | ICD-10-CM | POA: Insufficient documentation

## 2018-05-17 DIAGNOSIS — I2510 Atherosclerotic heart disease of native coronary artery without angina pectoris: Secondary | ICD-10-CM | POA: Insufficient documentation

## 2018-05-17 DIAGNOSIS — Z9221 Personal history of antineoplastic chemotherapy: Secondary | ICD-10-CM | POA: Diagnosis not present

## 2018-05-17 DIAGNOSIS — Z950 Presence of cardiac pacemaker: Secondary | ICD-10-CM | POA: Diagnosis not present

## 2018-05-17 DIAGNOSIS — E039 Hypothyroidism, unspecified: Secondary | ICD-10-CM | POA: Insufficient documentation

## 2018-05-17 DIAGNOSIS — C7801 Secondary malignant neoplasm of right lung: Secondary | ICD-10-CM | POA: Diagnosis not present

## 2018-05-17 DIAGNOSIS — I252 Old myocardial infarction: Secondary | ICD-10-CM | POA: Insufficient documentation

## 2018-05-17 DIAGNOSIS — R4189 Other symptoms and signs involving cognitive functions and awareness: Secondary | ICD-10-CM | POA: Diagnosis not present

## 2018-05-17 DIAGNOSIS — I619 Nontraumatic intracerebral hemorrhage, unspecified: Secondary | ICD-10-CM

## 2018-05-17 DIAGNOSIS — E291 Testicular hypofunction: Secondary | ICD-10-CM

## 2018-05-17 DIAGNOSIS — C770 Secondary and unspecified malignant neoplasm of lymph nodes of head, face and neck: Secondary | ICD-10-CM | POA: Insufficient documentation

## 2018-05-17 DIAGNOSIS — Z951 Presence of aortocoronary bypass graft: Secondary | ICD-10-CM | POA: Insufficient documentation

## 2018-05-17 DIAGNOSIS — C7951 Secondary malignant neoplasm of bone: Secondary | ICD-10-CM | POA: Diagnosis not present

## 2018-05-17 DIAGNOSIS — Z8673 Personal history of transient ischemic attack (TIA), and cerebral infarction without residual deficits: Secondary | ICD-10-CM

## 2018-05-17 DIAGNOSIS — C7802 Secondary malignant neoplasm of left lung: Secondary | ICD-10-CM | POA: Diagnosis not present

## 2018-05-17 DIAGNOSIS — C799 Secondary malignant neoplasm of unspecified site: Secondary | ICD-10-CM

## 2018-05-17 DIAGNOSIS — R5383 Other fatigue: Secondary | ICD-10-CM

## 2018-05-17 LAB — CBC WITH DIFFERENTIAL/PLATELET
ABS IMMATURE GRANULOCYTES: 0.02 10*3/uL (ref 0.00–0.07)
BASOS ABS: 0 10*3/uL (ref 0.0–0.1)
Basophils Relative: 0 %
Eosinophils Absolute: 0.4 10*3/uL (ref 0.0–0.5)
Eosinophils Relative: 6 %
HCT: 40.9 % (ref 39.0–52.0)
Hemoglobin: 14 g/dL (ref 13.0–17.0)
Immature Granulocytes: 0 %
LYMPHS PCT: 11 %
Lymphs Abs: 0.6 10*3/uL — ABNORMAL LOW (ref 0.7–4.0)
MCH: 31.7 pg (ref 26.0–34.0)
MCHC: 34.2 g/dL (ref 30.0–36.0)
MCV: 92.7 fL (ref 80.0–100.0)
MONO ABS: 0.6 10*3/uL (ref 0.1–1.0)
Monocytes Relative: 10 %
NEUTROS ABS: 4.1 10*3/uL (ref 1.7–7.7)
Neutrophils Relative %: 73 %
Platelets: 155 10*3/uL (ref 150–400)
RBC: 4.41 MIL/uL (ref 4.22–5.81)
RDW: 13.1 % (ref 11.5–15.5)
WBC: 5.7 10*3/uL (ref 4.0–10.5)
nRBC: 0 % (ref 0.0–0.2)

## 2018-05-17 LAB — TSH: TSH: 3.039 u[IU]/mL (ref 0.320–4.118)

## 2018-05-17 LAB — COMPREHENSIVE METABOLIC PANEL
ALBUMIN: 3.9 g/dL (ref 3.5–5.0)
ALT: 14 U/L (ref 0–44)
AST: 17 U/L (ref 15–41)
Alkaline Phosphatase: 52 U/L (ref 38–126)
Anion gap: 10 (ref 5–15)
BUN: 36 mg/dL — AB (ref 8–23)
CHLORIDE: 106 mmol/L (ref 98–111)
CO2: 26 mmol/L (ref 22–32)
Calcium: 11.2 mg/dL — ABNORMAL HIGH (ref 8.9–10.3)
Creatinine, Ser: 1.66 mg/dL — ABNORMAL HIGH (ref 0.61–1.24)
GFR calc Af Amer: 45 mL/min — ABNORMAL LOW (ref >60)
GFR, EST NON AFRICAN AMERICAN: 39 mL/min — AB (ref >60)
Glucose, Bld: 101 mg/dL — ABNORMAL HIGH (ref 70–99)
POTASSIUM: 4.7 mmol/L (ref 3.5–5.1)
SODIUM: 142 mmol/L (ref 135–145)
Total Bilirubin: 0.8 mg/dL (ref 0.3–1.2)
Total Protein: 6.8 g/dL (ref 6.5–8.1)

## 2018-05-17 NOTE — Telephone Encounter (Signed)
Scheduled appt per 2/7 los.  Printed calendar and avs.  Patient stated he will contact [place] about his PET scan.

## 2018-05-23 ENCOUNTER — Telehealth: Payer: Self-pay

## 2018-05-23 NOTE — Telephone Encounter (Signed)
Spoke with patient per Dr. Burr Medico to let him know to stop taking the Calcium supplement, he verbalized an understanding.

## 2018-05-23 NOTE — Telephone Encounter (Signed)
Patient calls stating he does not want to switch his care from [facility] to [place], will come tomorrow for injection, sent scheduling a message.  Informed him Calcium is high and that is why Dr. Burr Medico thinks the injection may help to lower.  He states he takes a Calcium and Vitamin D supplement and asked if he should stop the Calcium.  Informed him will check with Dr. Burr Medico and call him.

## 2018-05-24 ENCOUNTER — Inpatient Hospital Stay: Payer: Medicare Other

## 2018-05-24 ENCOUNTER — Other Ambulatory Visit: Payer: Self-pay | Admitting: Internal Medicine

## 2018-05-24 VITALS — BP 120/77 | Temp 97.5°F | Resp 16

## 2018-05-24 DIAGNOSIS — C770 Secondary and unspecified malignant neoplasm of lymph nodes of head, face and neck: Secondary | ICD-10-CM | POA: Diagnosis not present

## 2018-05-24 DIAGNOSIS — C7951 Secondary malignant neoplasm of bone: Secondary | ICD-10-CM | POA: Diagnosis not present

## 2018-05-24 DIAGNOSIS — C7931 Secondary malignant neoplasm of brain: Secondary | ICD-10-CM | POA: Diagnosis not present

## 2018-05-24 DIAGNOSIS — C439 Malignant melanoma of skin, unspecified: Secondary | ICD-10-CM | POA: Diagnosis not present

## 2018-05-24 DIAGNOSIS — C7802 Secondary malignant neoplasm of left lung: Secondary | ICD-10-CM | POA: Diagnosis not present

## 2018-05-24 LAB — CBC WITH DIFFERENTIAL (CANCER CENTER ONLY)
Abs Immature Granulocytes: 0.02 10*3/uL (ref 0.00–0.07)
Basophils Absolute: 0 10*3/uL (ref 0.0–0.1)
Basophils Relative: 0 %
Eosinophils Absolute: 0.3 10*3/uL (ref 0.0–0.5)
Eosinophils Relative: 5 %
HCT: 41.7 % (ref 39.0–52.0)
Hemoglobin: 14 g/dL (ref 13.0–17.0)
IMMATURE GRANULOCYTES: 0 %
Lymphocytes Relative: 8 %
Lymphs Abs: 0.6 10*3/uL — ABNORMAL LOW (ref 0.7–4.0)
MCH: 31.5 pg (ref 26.0–34.0)
MCHC: 33.6 g/dL (ref 30.0–36.0)
MCV: 93.7 fL (ref 80.0–100.0)
MONOS PCT: 9 %
Monocytes Absolute: 0.6 10*3/uL (ref 0.1–1.0)
NEUTROS ABS: 5.5 10*3/uL (ref 1.7–7.7)
NEUTROS PCT: 78 %
PLATELETS: 153 10*3/uL (ref 150–400)
RBC: 4.45 MIL/uL (ref 4.22–5.81)
RDW: 13 % (ref 11.5–15.5)
WBC: 7.1 10*3/uL (ref 4.0–10.5)
nRBC: 0 % (ref 0.0–0.2)

## 2018-05-24 LAB — URINALYSIS, COMPLETE (UACMP) WITH MICROSCOPIC
Bacteria, UA: NONE SEEN
Bilirubin Urine: NEGATIVE
Glucose, UA: NEGATIVE mg/dL
Ketones, ur: NEGATIVE mg/dL
Leukocytes,Ua: NEGATIVE
NITRITE: NEGATIVE
PH: 6 (ref 5.0–8.0)
Protein, ur: 100 mg/dL — AB
SPECIFIC GRAVITY, URINE: 1.013 (ref 1.005–1.030)

## 2018-05-24 LAB — CMP (CANCER CENTER ONLY)
ALT: 13 U/L (ref 0–44)
AST: 18 U/L (ref 15–41)
Albumin: 3.9 g/dL (ref 3.5–5.0)
Alkaline Phosphatase: 53 U/L (ref 38–126)
Anion gap: 10 (ref 5–15)
BUN: 32 mg/dL — ABNORMAL HIGH (ref 8–23)
CALCIUM: 10.3 mg/dL (ref 8.9–10.3)
CHLORIDE: 102 mmol/L (ref 98–111)
CO2: 27 mmol/L (ref 22–32)
CREATININE: 1.77 mg/dL — AB (ref 0.61–1.24)
GFR, EST AFRICAN AMERICAN: 42 mL/min — AB (ref >60)
GFR, EST NON AFRICAN AMERICAN: 36 mL/min — AB (ref >60)
GLUCOSE: 105 mg/dL — AB (ref 70–99)
POTASSIUM: 4.7 mmol/L (ref 3.5–5.1)
Sodium: 139 mmol/L (ref 135–145)
Total Bilirubin: 0.6 mg/dL (ref 0.3–1.2)
Total Protein: 6.9 g/dL (ref 6.5–8.1)

## 2018-05-24 MED ORDER — SODIUM CHLORIDE 0.9 % IV SOLN
INTRAVENOUS | Status: AC
  Filled 2018-05-24: qty 250

## 2018-05-24 MED ORDER — SODIUM CHLORIDE 0.9 % IV SOLN
INTRAVENOUS | Status: AC
  Administered 2018-05-24 (×2): via INTRAVENOUS
  Filled 2018-05-24 (×2): qty 250

## 2018-05-24 MED ORDER — DENOSUMAB 120 MG/1.7ML ~~LOC~~ SOLN
120.0000 mg | Freq: Once | SUBCUTANEOUS | Status: AC
  Administered 2018-05-24: 120 mg via SUBCUTANEOUS

## 2018-05-24 MED ORDER — DENOSUMAB 120 MG/1.7ML ~~LOC~~ SOLN
SUBCUTANEOUS | Status: AC
  Filled 2018-05-24: qty 1.7

## 2018-05-27 ENCOUNTER — Ambulatory Visit: Payer: Medicare Other | Admitting: Family Medicine

## 2018-06-03 ENCOUNTER — Ambulatory Visit (INDEPENDENT_AMBULATORY_CARE_PROVIDER_SITE_OTHER): Payer: Medicare Other | Admitting: Family Medicine

## 2018-06-03 ENCOUNTER — Encounter: Payer: Self-pay | Admitting: Family Medicine

## 2018-06-03 VITALS — BP 110/64 | HR 74 | Temp 97.4°F | Resp 14 | Ht 69.5 in | Wt 209.2 lb

## 2018-06-03 DIAGNOSIS — N183 Chronic kidney disease, stage 3 unspecified: Secondary | ICD-10-CM

## 2018-06-03 DIAGNOSIS — E349 Endocrine disorder, unspecified: Secondary | ICD-10-CM

## 2018-06-03 DIAGNOSIS — R972 Elevated prostate specific antigen [PSA]: Secondary | ICD-10-CM

## 2018-06-03 NOTE — Patient Instructions (Addendum)
Call Dr Olin Pia office (electrophysiologist) to schedule appointment [phone]. Return Friday morning for AM labs (non-fasting) for testosterone and kidney function.  Good to see you today, call us with questions.  Return in 3-4 months for follow up visit.

## 2018-06-03 NOTE — Assessment & Plan Note (Signed)
Reviewed recent uro visit. Appreciate Dr Dene Gentry care.

## 2018-06-03 NOTE — Assessment & Plan Note (Signed)
Recent dehydration episode. Will recheck renal panel when he returns Friday for testosterone check.

## 2018-06-03 NOTE — Assessment & Plan Note (Signed)
Feeling much better since restarting testosterone injections.  Just received testosterone injection yesterday - too soon for check. Will return Friday for Testosterone level.

## 2018-06-03 NOTE — Progress Notes (Signed)
BP 110/64   Pulse 74   Temp (!) 97.4 F (36.3 C)   Resp 14   Ht 5' 9.5" (1.765 m)   Wt 209 lb 3 oz (94.9 kg)   SpO2 95%   BMI 30.45 kg/m    CC:  2 mo f/u visit Subjective:    Patient ID: David [name]., male    DOB: 1940/08/20, 78 y.o.   MRN: [id number]  HPI: Saatvik Thielman. is a 78 y.o. male presenting on 06/03/2018 for Follow-up (2 months follow up)   Seen for Xvega infusion 2 wks ago, found to be dehydrated given 1/4 L IVF with improvement UA without infection. Making an effort to stay better hydrated.   Testosterone injections restarted 03/2018 (100mg  IM Q2 wks) - marked malaise/fatigue otherwise. Last shot was 2 days ago.   Saw urology - planned rpt PSA in setting of testosterone use. Appreciate their care.   Saw [name] - started on ritalin 10mg  daily PRN for inattention sxs.  Saw [name] - rec continued crestor 20mg .   Depression on sertraline 100mg  daily.       Relevant past medical, surgical, family and social history reviewed and updated as indicated. Interim medical history since our last visit reviewed. Allergies and medications reviewed and updated. Outpatient Medications Prior to Visit  Medication Sig Dispense Refill  . acetaminophen (TYLENOL) 500 MG tablet Take 500 mg by mouth at bedtime.    Marland Kitchen amLODipine (NORVASC) 5 MG tablet TAKE ONE TABLET BY MOUTH EVERY DAY 32 tablet 11  . aspirin EC 81 MG tablet Take 81 mg by mouth daily.    . Cholecalciferol (VITAMIN D3) 1000 units CAPS Take 1 capsule (1,000 Units total) by mouth daily. 30 capsule   . citalopram (CELEXA) 20 MG tablet Take 1 tablet (20 mg total) by mouth daily. 30 tablet 3  . fluocinonide cream (LIDEX) 0.05 % Apply topically 2 (two) times daily as needed. 30 g 0  . ketoconazole (NIZORAL) 2 % shampoo Apply 1 application topically 2 (two) times a week. 120 mL 1  . levothyroxine (SYNTHROID, LEVOTHROID) 137 MCG tablet TAKE 1 TABLET BY MOUTH ONCE DAILY BEFORE BREAKFAST 30 tablet 11  .  methylphenidate (RITALIN) 10 MG tablet Take 1 tablet (10 mg total) by mouth daily. 30 tablet 0  . metoprolol succinate (TOPROL-XL) 50 MG 24 hr tablet TAKE 1 AND 1/2 TABLET (75 MG) BY MOUTH EVERY DAY WITH OR IMMEDIATELY FOLLOWING A MEAL *DO NOT CRUSH* 50 tablet 11  . pentoxifylline (TRENTAL) 400 MG CR tablet Take 1 tablet (400 mg total) by mouth 2 (two) times daily. 60 tablet 6  . rosuvastatin (CRESTOR) 20 MG tablet TAKE ONE TABLET BY MOUTH AT BEDTIME 30 tablet 8  . testosterone cypionate (DEPOTESTOTERONE CYPIONATE) 100 MG/ML injection Inject 1 mL (100 mg total) into the muscle every 14 (fourteen) days. 10 mL 0  . Turmeric 500 MG CAPS Take 2 capsules by mouth daily.    . vitamin E (VITAMIN E) 400 UNIT capsule Take 1 capsule (400 Units total) by mouth 2 (two) times daily. 60 capsule 6   Facility-Administered Medications Prior to Visit  Medication Dose Route Frequency Provider Last Rate Last Dose  . 0.9 %  sodium chloride infusion  1,000 mL Intravenous Once [name], MD         Per HPI unless specifically indicated in ROS section below Review of Systems Objective:    BP 110/64   Pulse 74   Temp (!)  97.4 F (36.3 C)   Resp 14   Ht 5' 9.5" (1.765 m)   Wt 209 lb 3 oz (94.9 kg)   SpO2 95%   BMI 30.45 kg/m   Wt Readings from Last 3 Encounters:  06/03/18 209 lb 3 oz (94.9 kg)  05/17/18 206 lb 8 oz (93.7 kg)  05/06/18 206 lb 4 oz (93.6 kg)    Physical Exam Vitals signs and nursing note reviewed.  Constitutional:      Appearance: Normal appearance.  HENT:     Mouth/Throat:     Mouth: Mucous membranes are moist.     Pharynx: No oropharyngeal exudate.  Cardiovascular:     Rate and Rhythm: Normal rate and regular rhythm.     Pulses: Normal pulses.     Heart sounds: Normal heart sounds. No murmur.     Comments: Pacer in place L upper chest Pulmonary:     Effort: Pulmonary effort is normal. No respiratory distress.     Breath sounds: Normal breath sounds. No wheezing, rhonchi or  rales.  Neurological:     Mental Status: He is alert.  Psychiatric:        Mood and Affect: Mood normal.        Behavior: Behavior normal.       Results for orders placed or performed in visit on 05/24/18  CBC with Differential (Cancer Center Only)  Result Value Ref Range   WBC Count 7.1 4.0 - 10.5 K/uL   RBC 4.45 4.22 - 5.81 MIL/uL   Hemoglobin 14.0 13.0 - 17.0 g/dL   HCT 41.7 39.0 - 52.0 %   MCV 93.7 80.0 - 100.0 fL   MCH 31.5 26.0 - 34.0 pg   MCHC 33.6 30.0 - 36.0 g/dL   RDW 13.0 11.5 - 15.5 %   Platelet Count 153 150 - 400 K/uL   nRBC 0.0 0.0 - 0.2 %   Neutrophils Relative % 78 %   Neutro Abs 5.5 1.7 - 7.7 K/uL   Lymphocytes Relative 8 %   Lymphs Abs 0.6 (L) 0.7 - 4.0 K/uL   Monocytes Relative 9 %   Monocytes Absolute 0.6 0.1 - 1.0 K/uL   Eosinophils Relative 5 %   Eosinophils Absolute 0.3 0.0 - 0.5 K/uL   Basophils Relative 0 %   Basophils Absolute 0.0 0.0 - 0.1 K/uL   Immature Granulocytes 0 %   Abs Immature Granulocytes 0.02 0.00 - 0.07 K/uL  CMP (Cancer Center only)  Result Value Ref Range   Sodium 139 135 - 145 mmol/L   Potassium 4.7 3.5 - 5.1 mmol/L   Chloride 102 98 - 111 mmol/L   CO2 27 22 - 32 mmol/L   Glucose, Bld 105 (H) 70 - 99 mg/dL   BUN 32 (H) 8 - 23 mg/dL   Creatinine 1.77 (H) 0.61 - 1.24 mg/dL   Calcium 10.3 8.9 - 10.3 mg/dL   Total Protein 6.9 6.5 - 8.1 g/dL   Albumin 3.9 3.5 - 5.0 g/dL   AST 18 15 - 41 U/L   ALT 13 0 - 44 U/L   Alkaline Phosphatase 53 38 - 126 U/L   Total Bilirubin 0.6 0.3 - 1.2 mg/dL   GFR, Est Non Af Am 36 (L) >60 mL/min   GFR, Est AFR Am 42 (L) >60 mL/min   Anion gap 10 5 - 15  Urinalysis, Complete w Microscopic  Result Value Ref Range   Color, Urine YELLOW YELLOW   APPearance CLEAR CLEAR  Specific Gravity, Urine 1.013 1.005 - 1.030   pH 6.0 5.0 - 8.0   Glucose, UA NEGATIVE NEGATIVE mg/dL   Hgb urine dipstick MODERATE (A) NEGATIVE   Bilirubin Urine NEGATIVE NEGATIVE   Ketones, ur NEGATIVE NEGATIVE mg/dL    Protein, ur 100 (A) NEGATIVE mg/dL   Nitrite NEGATIVE NEGATIVE   Leukocytes,Ua NEGATIVE NEGATIVE   RBC / HPF 11-20 0 - 5 RBC/hpf   WBC, UA 0-5 0 - 5 WBC/hpf   Bacteria, UA NONE SEEN NONE SEEN   Mucus PRESENT    Lab Results  Component Value Date   TSH 3.039 05/17/2018    Assessment & Plan:   Problem List Items Addressed This Visit    Hypotestosteronemia - Primary    Feeling much better since restarting testosterone injections.  Just received testosterone injection yesterday - too soon for check. Will return Friday for Testosterone level.       Relevant Orders   Testos,Total,Free and SHBG (Male)   Elevated PSA    Reviewed recent uro visit. Appreciate Dr Dene Gentry care.       Chronic kidney disease, stage III (moderate) (HCC)    Recent dehydration episode. Will recheck renal panel when he returns Friday for testosterone check.       Relevant Orders   Renal function panel       No orders of the defined types were placed in this encounter.  Orders Placed This Encounter  Procedures  . Testos,Total,Free and SHBG (Male)    Standing Status:   Future    Standing Expiration Date:   06/04/2019  . Renal function panel    Standing Status:   Future    Standing Expiration Date:   06/04/2019    Follow up plan: Return in about 4 months (around 10/02/2018) for follow up visit.  [name], MD

## 2018-06-07 ENCOUNTER — Telehealth: Payer: Self-pay

## 2018-06-07 ENCOUNTER — Other Ambulatory Visit (INDEPENDENT_AMBULATORY_CARE_PROVIDER_SITE_OTHER): Payer: Medicare Other

## 2018-06-07 DIAGNOSIS — E349 Endocrine disorder, unspecified: Secondary | ICD-10-CM | POA: Diagnosis not present

## 2018-06-07 DIAGNOSIS — N183 Chronic kidney disease, stage 3 unspecified: Secondary | ICD-10-CM

## 2018-06-07 LAB — RENAL FUNCTION PANEL
Albumin: 4.2 g/dL (ref 3.5–5.2)
BUN: 34 mg/dL — AB (ref 6–23)
CO2: 27 meq/L (ref 19–32)
CREATININE: 1.52 mg/dL — AB (ref 0.40–1.50)
Calcium: 8.9 mg/dL (ref 8.4–10.5)
Chloride: 104 mEq/L (ref 96–112)
GFR: 44.47 mL/min — ABNORMAL LOW (ref >60.00)
Glucose, Bld: 88 mg/dL (ref 70–99)
PHOSPHORUS: 3 mg/dL (ref 2.3–4.6)
Potassium: 5.1 mEq/L (ref 3.5–5.1)
SODIUM: 138 meq/L (ref 135–145)

## 2018-06-07 NOTE — Telephone Encounter (Signed)
Pt said he knows Dr Danise Mina has written letters before to [facility] Motor vehicle division but pt requesting one more letter stating that pt does not have cancer and should be able to drive a motor vehicle. Pt is still trying to get his [place] driver's license back.  Pt said he needs one more letter from Dr Danise Mina recommending that pt be allowed to drive. Pt last seen 06/03/18. Pt request letter mailed to [place], [place] [id number] Pt request cb when completed.

## 2018-06-10 NOTE — Telephone Encounter (Signed)
Pt has been trying to get drivers license back for one year. Pt received in the mail today a letter that [place] Division of Motor Vehicles will be requesting Dr Danise Mina to fill out paperwork pgs 2 - 7. Pt wants to know if Dr Danise Mina received the paperwork from [place] [employer] and if so when will it be completed. Pt request cb.

## 2018-06-10 NOTE — Telephone Encounter (Signed)
Dr. Darnell Level, do you have this pt's paperwork?

## 2018-06-11 LAB — TESTOS,TOTAL,FREE AND SHBG (FEMALE)
Free Testosterone: 86.1 pg/mL (ref 30.0–135.0)
SEX HORMONE BINDING: 38 nmol/L (ref 22–77)
Testosterone, Total, LC-MS-MS: 463 ng/dL (ref 250–1100)

## 2018-06-11 NOTE — Telephone Encounter (Signed)
Pt calling to ck on status if Dr Darnell Level received paper work pgs 2 - 7 for [employer]. Pt request cb to let him know if papers have been received and how soon can papers be filled out and sent back to [facility]. Pt said he does not mind paying whatever the charge is for filling out papers.

## 2018-06-12 NOTE — Telephone Encounter (Signed)
Spoke with pt informing him we have not received his form. Says he will contact [place] [employer] and have them fax it to [phone].

## 2018-06-12 NOTE — Telephone Encounter (Signed)
Pt left v/m wanting to know if Dr Darnell Level received paperwork from [facility] and if paperwork has been sent in. Pt request cb with status.

## 2018-06-13 NOTE — Telephone Encounter (Signed)
Pt dropped off form. Placed in rx tower.

## 2018-06-13 NOTE — Telephone Encounter (Signed)
Letter given to Dr. Darnell Level.

## 2018-06-17 DIAGNOSIS — Z0279 Encounter for issue of other medical certificate: Secondary | ICD-10-CM

## 2018-06-17 NOTE — Telephone Encounter (Addendum)
I still do not have pages 2-7. I only have a letter requesting medical clearance after OT evaluation 10/2017.   If we need to fill out other pages, I will need these.  Letter written and in Lisa's box.

## 2018-06-17 NOTE — Telephone Encounter (Signed)
Spoke with pt relaying Dr. Synthia Innocent message and informing him of $20.00 fee for paperwork. Pt verbalizes understanding. Requests letter be mailed to [facility].  [Mailed letter to [employer] [place] [employer]. Also, mailed copy of letter to pt for his records. Made copies of letter to be scanned and for billing.]

## 2018-06-25 ENCOUNTER — Telehealth: Payer: Self-pay | Admitting: Internal Medicine

## 2018-06-25 NOTE — Telephone Encounter (Signed)
Called and spoke to pt regarding appointment 07/02/18 for device followup   Currently symptoms are quiescient  and we Discussed rescheduling of the appt 6-8 weeks from now   But we have no transmitted data,   His monitor is outdated so will arrange (EM) for get a new system set up and transmit prior to making a decision re appt  Pt is agreeable and advised to call if interval problems

## 2018-06-26 NOTE — Telephone Encounter (Signed)
Spoke w/ pt and informed him that I would order him a new monitor and cell adapter. Pt verbalized understanding  Called [employer] tech support to order pt a new monitor. Monitor will be received in 7-10 days. Cell adapter was mailed from clinic office.

## 2018-06-26 NOTE — Telephone Encounter (Signed)
Did Dr. Caryl Comes talk with anyone about getting this patient as new monitor prior to [place] r/s his 3/24 appt in [place]?

## 2018-07-01 NOTE — Telephone Encounter (Signed)
David Sprang, MD  Oneida Arenas, RN        Pt has been cancelled      Spoke with Dr. Caryl Comes- ok to move out.  He states the patient is aware and will not not be coming tomorrow.

## 2018-07-02 ENCOUNTER — Encounter: Payer: PRIVATE HEALTH INSURANCE | Admitting: Internal Medicine

## 2018-07-12 ENCOUNTER — Other Ambulatory Visit: Payer: Self-pay

## 2018-07-12 DIAGNOSIS — Z87898 Personal history of other specified conditions: Secondary | ICD-10-CM

## 2018-07-16 ENCOUNTER — Other Ambulatory Visit: Payer: Medicare Other

## 2018-07-26 NOTE — Telephone Encounter (Signed)
Do one of you ladies mind calling the patient to see if he can come in to see Dr. Caryl Comes in June? Either 6/16 or 6/23 at 9:15 am on either day.  Thanks!

## 2018-07-26 NOTE — Telephone Encounter (Signed)
Appt scheduled for 10/01/18 with Dr. Caryl Comes.

## 2018-08-05 ENCOUNTER — Other Ambulatory Visit: Payer: Self-pay | Admitting: *Deleted

## 2018-08-05 DIAGNOSIS — C7931 Secondary malignant neoplasm of brain: Secondary | ICD-10-CM

## 2018-08-08 ENCOUNTER — Other Ambulatory Visit: Payer: Self-pay | Admitting: Family Medicine

## 2018-08-15 ENCOUNTER — Ambulatory Visit: Payer: Medicare Other | Admitting: Hematology

## 2018-08-15 ENCOUNTER — Other Ambulatory Visit: Payer: Medicare Other

## 2018-08-15 ENCOUNTER — Ambulatory Visit (HOSPITAL_COMMUNITY): Admission: RE | Admit: 2018-08-15 | Payer: PRIVATE HEALTH INSURANCE | Source: Ambulatory Visit

## 2018-08-16 ENCOUNTER — Ambulatory Visit (HOSPITAL_COMMUNITY): Payer: PRIVATE HEALTH INSURANCE

## 2018-08-23 ENCOUNTER — Ambulatory Visit: Payer: Medicare Other | Admitting: Internal Medicine

## 2018-09-03 ENCOUNTER — Ambulatory Visit (INDEPENDENT_AMBULATORY_CARE_PROVIDER_SITE_OTHER): Payer: Medicare Other | Admitting: *Deleted

## 2018-09-03 DIAGNOSIS — I255 Ischemic cardiomyopathy: Secondary | ICD-10-CM

## 2018-09-03 LAB — CUP PACEART REMOTE DEVICE CHECK
Date Time Interrogation Session: 20200527120555
Implantable Lead Implant Date: 19980107
Implantable Lead Implant Date: 19980107
Implantable Lead Implant Date: 20141118
Implantable Lead Location: 753858
Implantable Lead Location: 753859
Implantable Lead Location: 753860
Implantable Pulse Generator Implant Date: 20141118
Pulse Gen Model: 3242
Pulse Gen Serial Number: 7523334

## 2018-09-10 ENCOUNTER — Encounter: Payer: Self-pay | Admitting: Family Medicine

## 2018-09-10 ENCOUNTER — Ambulatory Visit (INDEPENDENT_AMBULATORY_CARE_PROVIDER_SITE_OTHER): Payer: Medicare Other | Admitting: Family Medicine

## 2018-09-10 VITALS — BP 116/86 | HR 75 | Temp 97.7°F | Ht 69.5 in | Wt 200.0 lb

## 2018-09-10 DIAGNOSIS — R972 Elevated prostate specific antigen [PSA]: Secondary | ICD-10-CM | POA: Diagnosis not present

## 2018-09-10 DIAGNOSIS — R4189 Other symptoms and signs involving cognitive functions and awareness: Secondary | ICD-10-CM | POA: Diagnosis not present

## 2018-09-10 DIAGNOSIS — F331 Major depressive disorder, recurrent, moderate: Secondary | ICD-10-CM | POA: Diagnosis not present

## 2018-09-10 DIAGNOSIS — E349 Endocrine disorder, unspecified: Secondary | ICD-10-CM

## 2018-09-10 DIAGNOSIS — N183 Chronic kidney disease, stage 3 unspecified: Secondary | ICD-10-CM

## 2018-09-10 DIAGNOSIS — R7303 Prediabetes: Secondary | ICD-10-CM | POA: Diagnosis not present

## 2018-09-10 DIAGNOSIS — I10 Essential (primary) hypertension: Secondary | ICD-10-CM | POA: Diagnosis not present

## 2018-09-10 DIAGNOSIS — I69151 Hemiplegia and hemiparesis following nontraumatic intracerebral hemorrhage affecting right dominant side: Secondary | ICD-10-CM | POA: Diagnosis not present

## 2018-09-10 NOTE — Assessment & Plan Note (Signed)
Update renal panel 

## 2018-09-10 NOTE — Assessment & Plan Note (Signed)
Update A1c ?

## 2018-09-10 NOTE — Assessment & Plan Note (Addendum)
Update PSA per uro recs. If elevated, considering biopsy. It looks like he missed urology April appointment - he states it was postponed but I don't see f/u scheduled. I did ask him to call Dr Dene Gentry office to verify f/u scheduled.

## 2018-09-10 NOTE — Assessment & Plan Note (Signed)
Continue testosterone 100mg  IM Q2 wks. I asked him to return Friday for labs to recheck testosterone.

## 2018-09-10 NOTE — Progress Notes (Addendum)
Virtual visit completed through [employer]. Due to national recommendations of social distancing due to COVID-19, a virtual visit is felt to be most appropriate for this patient at this time. Reviewed limitations of a virtual visit.   Patient location: home Provider location: [place] at [facility], office If any vitals were documented, they were collected by patient at home unless specified below.    BP 116/86   Pulse 75   Temp 97.7 F (36.5 C)   Ht 5' 9.5" (1.765 m)   Wt 200 lb (90.7 kg)   BMI 29.11 kg/m    CC: 67mo f/u visit  Subjective:    Patient ID: David Stall., David [name]    DOB: 1940-07-14, 78 y.o.   MRN: [id number]  HPI: David Colley. is a 78 y.o. David [name] presenting on 09/10/2018 for Follow-up (3-4 mo f/u.)   Testosterone injections restarted 03/2018 (100mg  IM Q2 wks) with improvement in fatigue/malaise. Last shot was 4d ago.   Seeing urology - overdue for f/u.  Saw [name] - started on ritalin 10mg  daily PRN for inattention sxs.  Saw [name] - rec continued crestor 20mg .   Depression on celexa 20mg  daily.  New car. Driving well, ritalin helps ability.      Relevant past medical, surgical, family and social history reviewed and updated as indicated. Interim medical history since our last visit reviewed. Allergies and medications reviewed and updated. Outpatient Medications Prior to Visit  Medication Sig Dispense Refill  . acetaminophen (TYLENOL) 500 MG tablet Take 500 mg by mouth at bedtime.    Marland Kitchen amLODipine (NORVASC) 5 MG tablet TAKE ONE TABLET BY MOUTH EVERY DAY 32 tablet 11  . aspirin EC 81 MG tablet Take 81 mg by mouth daily.    . Cholecalciferol (VITAMIN D3) 1000 units CAPS Take 1 capsule (1,000 Units total) by mouth daily. 30 capsule   . citalopram (CELEXA) 20 MG tablet TAKE ONE TABLET BY MOUTH EVERY DAY 90 tablet 0  . fluocinonide cream (LIDEX) 0.05 % Apply topically 2 (two) times daily as needed. 30 g 0  . ketoconazole (NIZORAL) 2 % shampoo  Apply 1 application topically 2 (two) times a week. 120 mL 1  . levothyroxine (SYNTHROID, LEVOTHROID) 137 MCG tablet TAKE 1 TABLET BY MOUTH ONCE DAILY BEFORE BREAKFAST 30 tablet 11  . methylphenidate (RITALIN) 10 MG tablet Take 1 tablet (10 mg total) by mouth daily. 30 tablet 0  . metoprolol succinate (TOPROL-XL) 50 MG 24 hr tablet TAKE 1 AND 1/2 TABLET (75 MG) BY MOUTH EVERY DAY WITH OR IMMEDIATELY FOLLOWING A MEAL *DO NOT CRUSH* 50 tablet 11  . pentoxifylline (TRENTAL) 400 MG CR tablet Take 1 tablet (400 mg total) by mouth 2 (two) times daily. 60 tablet 6  . rosuvastatin (CRESTOR) 20 MG tablet TAKE ONE TABLET BY MOUTH AT BEDTIME 30 tablet 8  . testosterone cypionate (DEPOTESTOTERONE CYPIONATE) 100 MG/ML injection Inject 1 mL (100 mg total) into the muscle every 14 (fourteen) days. 10 mL 0  . Turmeric 500 MG CAPS Take 2 capsules by mouth daily.    . vitamin E (VITAMIN E) 400 UNIT capsule Take 1 capsule (400 Units total) by mouth 2 (two) times daily. 60 capsule 6   Facility-Administered Medications Prior to Visit  Medication Dose Route Frequency Provider Last Rate Last Dose  . 0.9 %  sodium chloride infusion  1,000 mL Intravenous Once [name], MD         Per HPI unless specifically indicated in  ROS section below Review of Systems Objective:    BP 116/86   Pulse 75   Temp 97.7 F (36.5 C)   Ht 5' 9.5" (1.765 m)   Wt 200 lb (90.7 kg)   BMI 29.11 kg/m   Wt Readings from Last 3 Encounters:  09/10/18 200 lb (90.7 kg)  06/03/18 209 lb 3 oz (94.9 kg)  05/17/18 206 lb 8 oz (93.7 kg)     Physical exam: Gen: alert, NAD, not ill appearing Pulm: speaks in complete sentences without increased work of breathing Psych: normal mood, normal thought content      Assessment & Plan:   Problem List Items Addressed This Visit    Prediabetes    Update A1c.       Relevant Orders   Hemoglobin A1c   MDD (major depressive disorder), recurrent episode, moderate (HCC)    Continue celexa 20mg   daily.       Hypotestosteronemia - Primary    Continue testosterone 100mg  IM Q2 wks. I asked him to return Friday for labs to recheck testosterone.      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 If elevated, considering biopsy. It looks like he missed urology April appointment - he states it was postponed but I don't see f/u scheduled. I did ask him to call Dr Dene Gentry office to verify f/u scheduled.       Relevant Orders   PSA   Cognitive impairment    Stable period. Sees neuro-oncology.       Chronic kidney disease, stage III (moderate) (HCC)    Update renal panel.       Relevant Orders   Renal function panel   Benign essential HTN    Chronic, stable. Continue current regimen.           No orders of the defined types were placed in this encounter.  Orders Placed This Encounter  Procedures  . PSA    Standing Status:   Future    Standing Expiration Date:   09/10/2019  . Renal function panel    Standing Status:   Future    Standing Expiration Date:   09/10/2019  . Testosterone    Standing Status:   Future    Standing Expiration Date:   09/10/2019  . Hemoglobin A1c    Standing Status:   Future    Standing Expiration Date:   09/10/2019    I discussed the assessment and treatment plan with the patient. The patient was provided an opportunity to ask questions and all were answered. The patient agreed with the plan and demonstrated an understanding of the instructions. The patient was advised to call back or seek an in-person evaluation if the symptoms worsen or if the condition fails to improve as anticipated.  Follow up plan: No follow-ups on file.  [name], MD

## 2018-09-10 NOTE — Assessment & Plan Note (Addendum)
Stable period. Sees neuro-oncology.

## 2018-09-10 NOTE — Assessment & Plan Note (Signed)
Continue celexa 20mg  daily.

## 2018-09-10 NOTE — Assessment & Plan Note (Signed)
Chronic, stable. Continue current regimen. 

## 2018-09-12 NOTE — Progress Notes (Signed)
Remote pacemaker transmission.   

## 2018-09-13 ENCOUNTER — Other Ambulatory Visit: Payer: PRIVATE HEALTH INSURANCE

## 2018-09-13 ENCOUNTER — Other Ambulatory Visit (INDEPENDENT_AMBULATORY_CARE_PROVIDER_SITE_OTHER): Payer: Medicare Other

## 2018-09-13 DIAGNOSIS — E349 Endocrine disorder, unspecified: Secondary | ICD-10-CM | POA: Diagnosis not present

## 2018-09-13 DIAGNOSIS — R7303 Prediabetes: Secondary | ICD-10-CM | POA: Diagnosis not present

## 2018-09-13 DIAGNOSIS — N183 Chronic kidney disease, stage 3 unspecified: Secondary | ICD-10-CM

## 2018-09-13 DIAGNOSIS — R972 Elevated prostate specific antigen [PSA]: Secondary | ICD-10-CM

## 2018-09-13 LAB — RENAL FUNCTION PANEL
Albumin: 4.2 g/dL (ref 3.5–5.2)
BUN: 33 mg/dL — ABNORMAL HIGH (ref 6–23)
CO2: 28 mEq/L (ref 19–32)
Calcium: 9.2 mg/dL (ref 8.4–10.5)
Chloride: 107 mEq/L (ref 96–112)
Creatinine, Ser: 1.5 mg/dL (ref 0.40–1.50)
GFR: 45.12 mL/min — ABNORMAL LOW (ref >60.00)
Glucose, Bld: 89 mg/dL (ref 70–99)
Phosphorus: 3.8 mg/dL (ref 2.3–4.6)
Potassium: 4.5 mEq/L (ref 3.5–5.1)
Sodium: 142 mEq/L (ref 135–145)

## 2018-09-13 LAB — PSA: PSA: 4.35 ng/mL — ABNORMAL HIGH (ref 0.10–4.00)

## 2018-09-13 LAB — HEMOGLOBIN A1C: Hgb A1c MFr Bld: 6.2 % (ref 4.6–6.5)

## 2018-09-13 LAB — TESTOSTERONE: Testosterone: 369.37 ng/dL (ref 300.00–890.00)

## 2018-09-14 ENCOUNTER — Telehealth: Payer: Self-pay | Admitting: Hematology

## 2018-09-16 ENCOUNTER — Encounter: Payer: Self-pay | Admitting: Family Medicine

## 2018-09-16 ENCOUNTER — Other Ambulatory Visit: Payer: Self-pay | Admitting: Radiation Therapy

## 2018-09-16 ENCOUNTER — Telehealth: Payer: Self-pay | Admitting: Family Medicine

## 2018-09-16 NOTE — Telephone Encounter (Signed)
Patient is returning a call in regards to his lab work  Phone- [phone]

## 2018-09-16 NOTE — Telephone Encounter (Signed)
Float spoke to pt about labs

## 2018-09-18 ENCOUNTER — Ambulatory Visit
Admission: RE | Admit: 2018-09-18 | Discharge: 2018-09-18 | Disposition: A | Discharge status: Home / Self Care | Payer: Medicare Other | Source: Ambulatory Visit | Attending: Hematology | Admitting: Hematology

## 2018-09-18 ENCOUNTER — Other Ambulatory Visit: Payer: Self-pay

## 2018-09-18 ENCOUNTER — Ambulatory Visit
Admission: RE | Admit: 2018-09-18 | Discharge: 2018-09-18 | Disposition: A | Discharge status: Home / Self Care | Payer: Medicare Other | Source: Ambulatory Visit | Attending: Internal Medicine | Admitting: Internal Medicine

## 2018-09-18 DIAGNOSIS — C7931 Secondary malignant neoplasm of brain: Secondary | ICD-10-CM | POA: Diagnosis not present

## 2018-09-18 DIAGNOSIS — C439 Malignant melanoma of skin, unspecified: Secondary | ICD-10-CM

## 2018-09-18 DIAGNOSIS — I714 Abdominal aortic aneurysm, without rupture: Secondary | ICD-10-CM | POA: Diagnosis not present

## 2018-09-18 DIAGNOSIS — M25462 Effusion, left knee: Secondary | ICD-10-CM | POA: Diagnosis not present

## 2018-09-18 DIAGNOSIS — K5730 Diverticulosis of large intestine without perforation or abscess without bleeding: Secondary | ICD-10-CM | POA: Diagnosis not present

## 2018-09-18 DIAGNOSIS — I517 Cardiomegaly: Secondary | ICD-10-CM | POA: Diagnosis not present

## 2018-09-18 DIAGNOSIS — J329 Chronic sinusitis, unspecified: Secondary | ICD-10-CM | POA: Insufficient documentation

## 2018-09-18 DIAGNOSIS — I2510 Atherosclerotic heart disease of native coronary artery without angina pectoris: Secondary | ICD-10-CM | POA: Insufficient documentation

## 2018-09-18 DIAGNOSIS — M419 Scoliosis, unspecified: Secondary | ICD-10-CM | POA: Diagnosis not present

## 2018-09-18 LAB — GLUCOSE, CAPILLARY: Glucose-Capillary: 87 mg/dL (ref 70–99)

## 2018-09-18 MED ORDER — FLUDEOXYGLUCOSE F - 18 (FDG) INJECTION
10.6400 | Freq: Once | INTRAVENOUS | Status: AC | PRN
  Administered 2018-09-18: 10.64 via INTRAVENOUS

## 2018-09-18 MED ORDER — IOHEXOL 300 MG/ML  SOLN
60.0000 mL | Freq: Once | INTRAMUSCULAR | Status: AC | PRN
  Administered 2018-09-18: 60 mL via INTRAVENOUS

## 2018-09-19 NOTE — Progress Notes (Signed)
Page   [phone]) [phone] [phone]) [phone]   Clinic Follow up Note   Patient Care Team: [name], MD as PCP - General (Family Medicine)  Date of Service:  09/23/2018  CHIEF COMPLAINT: Follow-up metastatic melanoma  SUMMARY OF ONCOLOGIC HISTORY: Oncology History Overview Note  Metastatic melanoma [facility])   Staging form: Melanoma of the Skin, AJCC 7th Edition     Clinical stage from 09/21/2015: Stage IV [place], [place] [place]) - Signed by [name], MD on 10/09/2015     Malignant melanoma ([place])  09/16/2015 Imaging   PET scan showed a hypermetabolic 4.7ML lingular nodule, and a 69m right upper lobe lung nodule, left subclavicular nodes, and bone metastasis in the proximal right humerus and right femur.    09/21/2015 Initial Diagnosis   [place]astatic [place]   09/21/2015 Initial Biopsy   Left subclavicular lymph node biopsy showed prostatic of melanoma   09/21/2015 Miscellaneous   BRAF V600K mutation (+)    10/04/2015 Imaging   CT head with without contrast showed a 2.0 x 1.6 x 1.7 cm superficial left frontal hyperdense lesion, with postcontrast enhancement, lying within the previous hemorrhagic area, concerning for solitary melanoma metastasis   10/06/2015 - 09/06/2016 Chemotherapy   Nivolumab 2419mevery 2 weeks, held due to his dexamethasone for radionecrosis    10/13/2015 - 10/13/2015 Radiation Therapy   10/13/2015 Preop SRS treatment: 14 Gy  to the left frontal lesion in 1 fraction   10/15/2015 Surgery   left front craniotomy and tumor excision    10/15/2015 Pathology Results   left frontal brain mass excision showed metastatic melanoma, 1.5X1.5X0.6cm   01/13/2016 Imaging   CT head without and with contrast showed ill-defined enhancement and dural thickening of the left frontal resection cavity may represent a post treatment changes or residual neoplasm. A new 6 mm hypodensity in the right cerebellar hemisphere with uncertainty enhancement may represent a  metastasis or small brain parenchymal hemorrhage.    01/27/2016 - 01/27/2016 Radiation Therapy   01/27/16 SRS Treatment:  20 Gy in 1 fraction to a 6 mm right cerebellar brain met    02/2016 - 05/24/2018 Chemotherapy   Monthly Xgeva starting 02/2016, changed to every 3 months on 01/30/2018. Stopped on 05/24/2018   03/29/2016 PET scan   PET 03/29/2016 IMPRESSION: 1. Overall there has been an interval response to therapy. The index lesions sided on previous exam it are less hypermetabolic when compared with the previous exam. 2. There is an intramuscular lesion within the right masseter which has increased FDG uptake compared with previous exam. 3. No new sites of disease identified. 4. Aortic atherosclerosis and infrarenal abdominal aortic aneurysm. Recommend followup by ultrasound in 3 years. This recommendation follows ACR consensus guidelines: White Paper of the ACR Incidental Findings Committee II on Vascular Findings. J Joellyn Ruedadiol 2013; 10:789-794   04/20/2016 Imaging   CT Head w wo contrast 1. Mild patchy enhancement at the left anterior frontal lobe treatment site (series 11, image 39) without mass effect and stable surrounding white matter hypodensity without strong evidence of acute vasogenic edema. Recommend continued CT surveillance. 2. No new metastatic disease or new intracranial abnormality identified. 3. Probable benign 14 mm left parietal bone lucent lesion, unchanged since May 2017.   05/06/2016 - 05/08/2016 Hospital Admission   Pt was admitted for rectal bleeding for one day. Breathing spontaneously resolved, hemoglobin dropped from 15 to 10.5, did not require blood transfusion. Colonoscopy showed no active bleeding, except diverticulosis and hemorrhoid. He  was discharged to home.   05/08/2016 Procedure   Colonoscopy  - Stool in the ascending colon. Fluid aspiration performed. - Diverticulosis in the entire examined colon. - Non-bleeding internal  hemorrhoids. - The examination was otherwise normal on direct and retroflexion views.   05/29/2016 Imaging   PET Image Restaging IMPRESSION: 1. Stable exam with no evidence progression. 2. Photopenia in the LEFT frontal lobe at prior surgical site. 3. Stable mildly hypermetabolic RIGHT paratracheal node. This may represent a thyroid nodule. 4. Stable size of minimally metabolic lingular pulmonary nodule. 5. Stable skeletal lesion of the RIGHT iliac crest 6. No cutaneous metabolic lesion.   07/20/2016 Imaging   CT Head W WO Contrast IMPRESSION: Pronounced change in the left frontal region. Much more regional vasogenic edema. Relatively stable appearance of the abnormal enhancement extending from the lateral margin of the frontal horn of left lateral ventricle to the frontal brain surface. New region of abnormal enhancement surrounding the frontal horn of the left lateral ventricle measuring approximately 3.2 cm. The differential diagnosis is recurrent tumor versus radiation necrosis. The patient is reportedly not an MRI candidate. This area is large enough that CT perfusion could possibly make a reasonable differentiation.   09/02/2016 Imaging   CT Head 09/02/16 IMPRESSION: Somewhat mixed pattern of slight posterior progression of pleomorphic enhancement surrounding the LEFT lateral ventricle although no significant mass effect, and reduced vasogenic edema compared with the scan in April.   09/27/2016 - 11/02/2016 Chemotherapy   Start dabrafenib and trametinib 09/27/16, stopped due to severe fatigue     10/05/2016 Imaging   CT Head w & w/o contrast  IMPRESSION: 1. Decreased size of the enhancing area within the left frontal lobe treatment site. Surrounding edema has also decreased. The findings support continued evolution of radiation necrosis. 2. No new enhancing lesions.   10/18/2016 PET scan   IMPRESSION: 1. New focal hypermetabolism in the proximal sigmoid colon  with associated focal colonic wall thickening and mild pericolonic fat stranding at the site of a proximal sigmoid diverticulum, favored to represent mild acute sigmoid diverticulitis. Metastatic disease is considered less likely. Clinical correlation is necessary. Consider appropriate antibiotic therapy, as clinically warranted. Attention to this region recommended on future PET-CT follow-up. 2. Otherwise complete metabolic response with no evidence of hypermetabolic metastatic disease. Lingular pulmonary nodule is slightly decreased in size and demonstrates no significant metabolism. 3. No residual hypermetabolism in the right thyroid lobe nodule, which is stable in size. 4. Aortic Atherosclerosis (ICD10-I70.0). Stable 3.1 cm infrarenal abdominal aortic aneurysm. Aortic aneurysm NOS (ICD10-I71.9). Recommend followup by ultrasound in 3 years. This recommendation follows ACR consensus guidelines: White Paper of the ACR Incidental Findings Committee II on Vascular Findings. Joellyn Rued Radiol 2013; 63:846-659.   11/01/2016 - 11/04/2016 Hospital Admission   Patient admitted to the hospital due to hypercalcemia where he received IV fluids and pamidronate   12/13/2016 PET scan   PET 12/13/16 IMPRESSION: 1. No findings to suggest metastatic disease on today's examination. 2. Previously noted hypermetabolism in the sigmoid colon has resolved, presumably indicative of a focus of diverticulitis on the prior study. Today's study does demonstrate relatively diffuse hypermetabolism throughout the cecum, ascending colon and proximal transverse colon where there is some mild mural thickening, favored to reflect mild chronic inflammation related to underlying diverticular disease. No definite inflammatory changes on the CT portion of the examination to strongly suggest an active acute diverticulitis at this time. No discrete lesion to suggest neoplasm. 3. Previously noted 8 mm lingular  nodule is stable  in size and known remains non hypermetabolic. This is nonspecific but reassuring. Continued attention on future followup studies is recommended. 4. Aortic atherosclerosis, in addition to left main and 3 vessel coronary artery disease. Status post median sternotomy for CABG including LIMA to the LAD. In addition, there is mild fusiform aneurysmal dilatation of the infrarenal abdominal aorta (3.1 cm in diameter) as well as aneurysmal dilatation of the left common iliac   12/18/2016 - 12/31/2017 Chemotherapy   Restarted nivolamab 221m every 2 weeks, with tapering dose dexamethasone     04/18/2017 Imaging   PET Scan IMPRESSION: 1. No hypermetabolic lesion to suggest active malignancy. 2. Stable 8 mm lingular nodule is not currently hypermetabolic but merit surveillance. Infrarenal abdominal aortic aneurysm 3.1 cm in diameter. Recommend followup by [facility] 3 years. 3. Other imaging findings of potential clinical significance: Postoperative findings in the left frontal lobe. Right renal cyst. Aortic Atherosclerosis (ICD10-I70.0). Osteoarthritis. Lumbar scoliosis. Left knee effusion. Colonic diverticulosis.    10/10/2017 PET scan   IMPRESSION: 1. Small focus of FDG accumulation in the subcutaneous fat of the right gluteal region, nonspecific. Attention on follow-up suggested. 2. Otherwise stable exam without new or progressive hypermetabolic disease in the interval since prior PET-CT. 3. Stable 8 mm nodule in the lingula. No evidence for hypermetabolism on PET imaging. 4.  Aortic Atherosclerois (ICD10-170.0)    01/29/2018 PET scan   IMPRESSION: 1. Small focus of hypermetabolism in the lower left paratracheal neck, which appears to localize to the posterior left thyroid lobe, without discrete thyroid nodule on the CT images, decreased in metabolism since 10/10/2017 PET-CT. This finding is nonspecific and may represent a hypermetabolic thyroid nodule. Consider thyroid ultrasound correlation. 2.  No new hypermetabolic findings suspicious for metastatic disease. Previously described low level metabolism in the subcutaneous right gluteal region has resolved, compatible with resolved inflammatory focus. No evidence of recurrent hypermetabolism within the stable lingular pulmonary nodule. No evidence of recurrent osseous hypermetabolism. 3. Stable infrarenal 3.4 cm Abdominal Aortic Aneurysm (ICD10-I71.9). Recommend follow-up aortic ultrasound in 3 years. This recommendation follows ACR consensus guidelines: White Paper of the ACR Incidental Findings Committee II on Vascular Findings. J Am Coll Radiol 2013;; 94:496-759 4.  Aortic Atherosclerosis (ICD10-I70.0).   09/18/2018 PET scan   PET 09/18/18  IMPRESSION: 1. No findings of active malignancy. The known small right cerebellar mass is not readily apparent on PET-CT but was shown on today's CT head. 2. 3.2 cm infrarenal abdominal aortic aneurysm. Recommend followup by [facility] 3 years. This recommendation follows ACR consensus guidelines: White Paper of the ACR Incidental Findings Committee II on Vascular Findings. J Am Coll Radiol 2013; 10:789-794. Aortic aneurysm NOS (ICD10-I71.9). 3. Other imaging findings of potential clinical significance: Left frontal lobe encephalomalacia underlying the craniotomy site. Chronic paranasal sinusitis. Aortic Atherosclerosis (ICD10-I70.0). Coronary atherosclerosis. Mild cardiomegaly. Colonic diverticulosis. Dextroconvex lumbar scoliosis. Small left knee effusion with mild synovitis. Postoperative findings in the right foot.   09/18/2018 Imaging   CT head  IMPRESSION: 1. No evidence of new intracranial metastases or acute abnormality. 2. Unchanged 4 mm right cerebellar metastasis. 3. Stable post treatment changes in the left frontal lobe.      CURRENT THERAPY:  Surveillance.   INTERVAL HISTORY:  CJamarques Pinedo is here for a follow up of metastatic melanoma. He presents to the  clinic alone. I called his daughter KClaiborne Billingsto be included in the visit. He notes he has been doing moderately well. He feels his memory  is stable, not worse. He notes having lower left back pain next to spine which he related to the way he sleeps. This is manageable. He has been taking Tylenol 4-6 times a day due to spinal pain. He does have sclerosis.  He denies pain anywhere else.    He notes he is driving again but can drive within 30 miles of his home and more on the roads than highways. He notes his wife is declining in her memory.     REVIEW OF SYSTEMS:   Constitutional: Denies fevers, chills or abnormal weight loss Eyes: Denies blurriness of vision Ears, nose, mouth, throat, and face: Denies mucositis or sore throat Respiratory: Denies cough, dyspnea or wheezes Cardiovascular: Denies palpitation, chest discomfort or lower extremity swelling Gastrointestinal:  Denies nausea, heartburn or change in bowel habits Skin: Denies abnormal skin rashes MSK: (+) Lower left back pain, manageable (1/10) (+) chronic Spinal pain  Lymphatics: Denies new lymphadenopathy or easy bruising Neurological:Denies numbness, tingling or new weaknesses (+) Memory stable.  Behavioral/Psych: Mood is stable, no new changes  All other systems were reviewed with the patient and are negative.  MEDICAL HISTORY:  Past Medical History:  Diagnosis Date   Anginal pain ([employer])    Arthritis    mostly hands   Benign familial hematuria    BPH (benign prostatic hypertrophy)    Brain cancer ([facility])    melanoma with brain met   Coronary artery disease    GERD (gastroesophageal reflux disease)    High cholesterol    Hypertension    Hypothyroidism    Intracerebral hemorrhage ([facility])    Melanoma ([place])    Metastatic melanoma to lung ([facility])    Mood disorder ([place])    Myocardial infarction [facility])    Pacemaker    due to syncope, 3rd degree HB (upgrade to [employer] CRT-P 02/25/13 (Dr. Uvaldo Rising)   Rectal bleed     due to NSAIDS   Renal disorder    Skin cancer    melanoma   Sleep apnea    uses CPAP   Stroke [facility])     SURGICAL HISTORY: Past Surgical History:  Procedure Laterality Date   APPLICATION OF CRANIAL NAVIGATION N/A 10/15/2015   Procedure: APPLICATION OF CRANIAL NAVIGATION;  Surgeon: Erline Levine, MD;  Location: [place] NEURO ORS;  Service: Neurosurgery;  Laterality: N/A;   CARDIAC CATHETERIZATION  2013   CARDIAC SURGERY     bypass X 2   COLONOSCOPY WITH PROPOFOL N/A 05/08/2016   diverticulosis, int hem, no f/u needed [name])   CORONARY ARTERY BYPASS GRAFT  2013   LIMA-LAD, SVG-PDA 03/27/11 (Dr. Francee Gentile)   [place] N/A 10/15/2015   Procedure: LEFT FRONTAL CRANIOTOMY TUMOR EXCISION with Curve;  Surgeon: Erline Levine, MD;  Location: [place] NEURO ORS;  Service: Neurosurgery;  Laterality: N/A;  CRANIOTOMY TUMOR EXCISION   EYE SURGERY     FOOT SURGERY  08/2010   JOINT REPLACEMENT Left    partial knee   KNEE ARTHROSCOPY  12/2008   LYMPH NODE BIOPSY     PACEMAKER INSERTION     TONSILLECTOMY      I have reviewed the social history and family history with the patient and they are unchanged from previous note.  ALLERGIES:  is allergic to ezetimibe and simvastatin.  MEDICATIONS:  Current Outpatient Medications  Medication Sig Dispense Refill   acetaminophen (TYLENOL) 500 MG tablet Take 500 mg by mouth at bedtime.     amLODipine (NORVASC) 5 MG tablet TAKE ONE TABLET BY MOUTH  EVERY DAY 32 tablet 11   aspirin EC 81 MG tablet Take 81 mg by mouth daily.     Cholecalciferol (VITAMIN D3) 1000 units CAPS Take 1 capsule (1,000 Units total) by mouth daily. 30 capsule    citalopram (CELEXA) 20 MG tablet TAKE ONE TABLET BY MOUTH EVERY DAY 90 tablet 0   fluocinonide cream (LIDEX) 0.05 % Apply topically 2 (two) times daily as needed. 30 g 0   ketoconazole (NIZORAL) 2 % shampoo Apply 1 application topically 2 (two) times a week. 120 mL 1   levothyroxine (SYNTHROID, LEVOTHROID) 137 MCG  tablet TAKE 1 TABLET BY MOUTH ONCE DAILY BEFORE BREAKFAST 30 tablet 11   methylphenidate (RITALIN) 10 MG tablet Take 1 tablet (10 mg total) by mouth daily. 30 tablet 0   metoprolol succinate (TOPROL-XL) 50 MG 24 hr tablet TAKE 1 AND 1/2 TABLET (75 MG) BY MOUTH EVERY DAY WITH OR IMMEDIATELY FOLLOWING A MEAL *DO NOT CRUSH* 50 tablet 11   pentoxifylline (TRENTAL) 400 MG CR tablet Take 1 tablet (400 mg total) by mouth 2 (two) times daily. 60 tablet 6   rosuvastatin (CRESTOR) 20 MG tablet TAKE ONE TABLET BY MOUTH AT BEDTIME 30 tablet 8   testosterone cypionate (DEPOTESTOTERONE CYPIONATE) 100 MG/ML injection Inject 1 mL (100 mg total) into the muscle every 14 (fourteen) days. 10 mL 0   Turmeric 500 MG CAPS Take 2 capsules by mouth daily.     vitamin E (VITAMIN E) 400 UNIT capsule Take 1 capsule (400 Units total) by mouth 2 (two) times daily. 60 capsule 6   No current facility-administered medications for this visit.    Facility-Administered Medications Ordered in Other Visits  Medication Dose Route Frequency Provider Last Rate Last Dose   0.9 %  sodium chloride infusion  1,000 mL Intravenous Once [name], MD        PHYSICAL EXAMINATION: ECOG PERFORMANCE STATUS: 2 - Symptomatic, <50% confined to bed  Vitals:   09/23/18 1018  BP: 125/87  Pulse: 75  Resp: 18  Temp: 98.5 F (36.9 C)  SpO2: 95%   Filed Weights   09/23/18 1018  Weight: 209 lb 14.4 oz (95.2 kg)    GENERAL:alert, no distress and comfortable SKIN: skin color, texture, turgor are normal, no rashes or significant lesions EYES: normal, Conjunctiva are pink and non-injected, sclera clear  NECK: supple, thyroid normal size, non-tender, without nodularity LYMPH:  no palpable lymphadenopathy in the cervical, axillary  LUNGS: clear to auscultation and percussion with normal breathing effort HEART: regular rate & rhythm and no murmurs and no lower extremity edema ABDOMEN:abdomen soft, non-tender and normal bowel sounds (+)  Mild epigastric tenderness  Musculoskeletal:no cyanosis of digits and no clubbing  NEURO: alert & oriented x 3 with fluent speech, no focal motor/sensory deficits  LABORATORY DATA:  I have reviewed the data as listed CBC Latest Ref Rng & Units 09/23/2018 05/24/2018 05/17/2018  WBC 4.0 - 10.5 K/uL 2.1(L) 7.1 5.7  Hemoglobin 13.0 - 17.0 g/dL 14.1 14.0 14.0  Hematocrit 39.0 - 52.0 % 43.2 41.7 40.9  Platelets 150 - 400 K/uL 129(L) 153 155     CMP Latest Ref Rng & Units 09/23/2018 09/13/2018 06/07/2018  Glucose 70 - 99 mg/dL 89 89 88  BUN 8 - 23 mg/dL 32(H) 33(H) 34(H)  Creatinine 0.61 - 1.24 mg/dL 1.56(H) 1.50 1.52(H)  Sodium 135 - 145 mmol/L 141 142 138  Potassium 3.5 - 5.1 mmol/L 4.8 4.5 5.1  Chloride 98 - 111 mmol/L 109 107  104  CO2 22 - 32 mmol/L _0 Calcium 8.9 - 10.3 mg/dL 9.1 9.2 8.9  Total Protein 6.5 - 8.1 g/dL 6.7 - -  Total Bilirubin 0.3 - 1.2 mg/dL 0.5 - -  Alkaline Phos 38 - 126 U/L 49 - -  AST 15 - 41 U/L 24 - -  ALT 0 - 44 U/L 25 - -      RADIOGRAPHIC STUDIES: I have personally reviewed the radiological images as listed and agreed with the findings in the report. No results found.   ASSESSMENT & PLAN:  David [name]. is a 78 y.o. male with   1. Metastatic melanoma to lung, node, bone and brain, stage IV, BRAF V600K mutation (+), NED now -He was diagnosed in 09/2015. He has been treated with SRS treatments left front craniotomy and tumor excision, dabrafenib and trametinib and Nivolumab.  -Due to his NED on scan, I have stoppedNivolumab after2 years of treatment in 12/2017. He has remained NED since.  -He has been on Xgeva monthly for his bone metastasis since 02/2016. Given cancer is in remission, his bone mets are stable and has been on Xgeva for over 2 years, I was planning to stop his Injections. However he still has hypercalcemia, will continue it every 3 months  -We discussed his PET form 09/18/18 which showed NED. I reviewed the images and discussed  with pt. His CT Head showed stable brain mets, s/p treatment.  -I discussed he is 2 years since end of treatment. He is clinically stable with stable memory loss. Physical exam unremarkable today. Labs reveiwed, CBC and CMP except WBC 2.1, PLT 122K, ANC 1.3, Lymphocytes 0.5, BUN 32, Cr 1.56. TSH still pending.  -Continue surveillance. Will rescan in 1 year if he is clinically doing well.  -I encouraged him to contact clinic with any concerning symptoms of recurrence.  -F/u in 5-6 months. He will continue to f/u with dermatologist to check his skin regularly.   2.Bone metastasis  -He was on Xgeva monthly for 2 years, restaging PET scan has showed no hypermetabolic bone metastasis and stopped after 05/24/18.  -I encouraged him take VitD daily.    3. History of hemorrhagic stroke and brain metastasis  -He has recovered very well -History post SRS and surgical resection of his solitary brain metastasis  -He previously received a course of dexamethasone for radionecrosis -He went to ED for slurred speech and vomiting and a Head CT was taken 09/02/16 which showed radionecrosis -I reviewed his restaging CT had with our neuro-oncologist Dr. Mickeal Skinner, his radionecrosis has improved, no other new lesions. He was also seen by Dr. Mickeal Skinner who feels he is recovering well from neurological standpoint -I tapered his steroids until he stopped altogether by 01/29/17 -Latest CT head in 09/2018 is stable -He will continue to f/u with Dr. Mickeal Skinner.  4. Memory loss and cognitive dysfunction -He has developed a worsening memory loss, mild aphasia, cognitive dysfunction since May 2018, likely secondary to the radionecrosis of his treated brain metastasis, and previous stroke  -CT Head 11/27/2016 showed continued evolution of post radiation changes in the left frontal lobe with further decrease in the area of contrast enhancement and adjacent edema. No new contrast-enhancing lesions.  -continue PT/OT  -Latest CT head  in 09/2018 is stable -He is on Ritalin to help when he reads and drives. He currently only drives 30 miles distance of his home.  -He will continue to follow up with Dr. Mickeal Skinner.  5. Hypercalcemia and CKD stage III  -Possibly related to his underlying malignancy (bone mets) and missing one dose of Xgeva. He was on Xgeva until 05/2018  -He has been advised to reduce dairy in diet and increase water intake and avoid NSAIDs -Hypercalcemia has resolved -Cr at 1.56, BUN 32, stable. (09/23/18)   6. Fatigue, hypothyroidism and hypotestosteronemia -He has baseline hypothyroidism and hypotestosteronemia -His fatigue has much improved since he restarted testosterone injection by his primary care physician -He will follow-up with his primary care physician for his Synthroid dose adjustment    7. HTN  -He is currently on amlodipine and Toprolol, follow-up of his primary care physician -HTN controlled with normal BP lately   8. Depression  -He is on Zoloft 161m daily, management by his PCP  -Due to his recent insomnia, he has reduced his Zoloft to 25 mg daily by himself, he reports to be less motivated to do physical therapy after the change, I encouraged him to increase Zoloft to 50 mg daily, and consider melatonin at night for insomnia. -His mood is doing well    9. Goal of care discussion  -We previously discussed the incurable nature of his metastatic melanoma, and the overall poor prognosis, especially if he does not have good response to chemotherapy or progress on chemo -The patient understands the goal of care is palliative. -He is full code, recent PET showed NED   10. CAD -history of 2 double vessel bypass -He has pace maker -Continue to follow up with, cardiologist, Dr. BHaroldine Laws  11. Lower left back pain, Lumbar Scoliosis  -His lower back pain has been ongoing for 3-4 months, overall stable and persistent  -He takes Tylenol as needed. I will call in Muscle relaxant if  needed. He knows to avoid NSAIDs due to CKD.  -Recent PET scan shows no hypermetabolic metastasis. This is likely musculoskeletal related. I encouraged him to do more PT and remain active. He is agreeable.    Plan -PET scan reviewed, stable and NED  -Send home PT referral for his back pain  -Lab and f/u in 5-6 months  -I called his daughter KClaiborne Billingsduring his visit    No problem-specific Assessment & Plan notes found for this encounter.   No orders of the defined types were placed in this encounter.  All questions were answered. The patient knows to call the clinic with any problems, questions or concerns. No barriers to learning was detected. I spent 20 minutes counseling the patient face to face. The total time spent in the appointment was 25 minutes and more than 50% was on counseling and review of test results     YTruitt Merle MD 09/23/2018   I, AJoslyn Devon am acting as scribe for YTruitt Merle MD.   I have reviewed the above documentation for accuracy and completeness, and I agree with the above.

## 2018-09-20 ENCOUNTER — Telehealth: Payer: Self-pay | Admitting: Urology

## 2018-09-20 NOTE — Telephone Encounter (Signed)
-----   Message from Abbie Sons, MD sent at 09/20/2018 11:03 AM EDT ----- Regarding: Telephone visit Recent PSA Dr. Danise Mina office rose on testosterone.  Please schedule telephone visit to discuss further.  Thanks

## 2018-09-20 NOTE — Telephone Encounter (Signed)
App made and pt is aware

## 2018-09-23 ENCOUNTER — Inpatient Hospital Stay: Payer: Medicare Other | Attending: Internal Medicine | Admitting: Internal Medicine

## 2018-09-23 ENCOUNTER — Inpatient Hospital Stay: Payer: Medicare Other

## 2018-09-23 ENCOUNTER — Telehealth (INDEPENDENT_AMBULATORY_CARE_PROVIDER_SITE_OTHER): Payer: Medicare Other | Admitting: Urology

## 2018-09-23 ENCOUNTER — Encounter: Payer: Self-pay | Admitting: Hematology

## 2018-09-23 ENCOUNTER — Other Ambulatory Visit: Payer: Self-pay

## 2018-09-23 ENCOUNTER — Inpatient Hospital Stay (HOSPITAL_BASED_OUTPATIENT_CLINIC_OR_DEPARTMENT_OTHER): Payer: Medicare Other | Admitting: Hematology

## 2018-09-23 VITALS — BP 125/87 | HR 75 | Temp 98.5°F | Resp 18 | Ht 69.5 in | Wt 209.9 lb

## 2018-09-23 DIAGNOSIS — Z87891 Personal history of nicotine dependence: Secondary | ICD-10-CM

## 2018-09-23 DIAGNOSIS — E291 Testicular hypofunction: Secondary | ICD-10-CM

## 2018-09-23 DIAGNOSIS — C439 Malignant melanoma of skin, unspecified: Secondary | ICD-10-CM | POA: Diagnosis not present

## 2018-09-23 DIAGNOSIS — E039 Hypothyroidism, unspecified: Secondary | ICD-10-CM | POA: Insufficient documentation

## 2018-09-23 DIAGNOSIS — I10 Essential (primary) hypertension: Secondary | ICD-10-CM

## 2018-09-23 DIAGNOSIS — N183 Chronic kidney disease, stage 3 unspecified: Secondary | ICD-10-CM

## 2018-09-23 DIAGNOSIS — C770 Secondary and unspecified malignant neoplasm of lymph nodes of head, face and neck: Secondary | ICD-10-CM | POA: Diagnosis not present

## 2018-09-23 DIAGNOSIS — Z79899 Other long term (current) drug therapy: Secondary | ICD-10-CM | POA: Diagnosis not present

## 2018-09-23 DIAGNOSIS — C7801 Secondary malignant neoplasm of right lung: Secondary | ICD-10-CM | POA: Diagnosis not present

## 2018-09-23 DIAGNOSIS — I255 Ischemic cardiomyopathy: Secondary | ICD-10-CM | POA: Diagnosis not present

## 2018-09-23 DIAGNOSIS — R4189 Other symptoms and signs involving cognitive functions and awareness: Secondary | ICD-10-CM | POA: Diagnosis not present

## 2018-09-23 DIAGNOSIS — C7931 Secondary malignant neoplasm of brain: Secondary | ICD-10-CM

## 2018-09-23 DIAGNOSIS — C7951 Secondary malignant neoplasm of bone: Secondary | ICD-10-CM

## 2018-09-23 DIAGNOSIS — R972 Elevated prostate specific antigen [PSA]: Secondary | ICD-10-CM

## 2018-09-23 DIAGNOSIS — C799 Secondary malignant neoplasm of unspecified site: Secondary | ICD-10-CM

## 2018-09-23 DIAGNOSIS — I619 Nontraumatic intracerebral hemorrhage, unspecified: Secondary | ICD-10-CM

## 2018-09-23 DIAGNOSIS — Z7982 Long term (current) use of aspirin: Secondary | ICD-10-CM | POA: Diagnosis not present

## 2018-09-23 LAB — COMPREHENSIVE METABOLIC PANEL
ALT: 25 U/L (ref 0–44)
AST: 24 U/L (ref 15–41)
Albumin: 3.9 g/dL (ref 3.5–5.0)
Alkaline Phosphatase: 49 U/L (ref 38–126)
Anion gap: 9 (ref 5–15)
BUN: 32 mg/dL — ABNORMAL HIGH (ref 8–23)
CO2: 23 mmol/L (ref 22–32)
Calcium: 9.1 mg/dL (ref 8.9–10.3)
Chloride: 109 mmol/L (ref 98–111)
Creatinine, Ser: 1.56 mg/dL — ABNORMAL HIGH (ref 0.61–1.24)
GFR calc Af Amer: 48 mL/min — ABNORMAL LOW (ref >60)
GFR calc non Af Amer: 42 mL/min — ABNORMAL LOW (ref >60)
Glucose, Bld: 89 mg/dL (ref 70–99)
Potassium: 4.8 mmol/L (ref 3.5–5.1)
Sodium: 141 mmol/L (ref 135–145)
Total Bilirubin: 0.5 mg/dL (ref 0.3–1.2)
Total Protein: 6.7 g/dL (ref 6.5–8.1)

## 2018-09-23 LAB — CBC WITH DIFFERENTIAL/PLATELET
Abs Immature Granulocytes: 0.01 10*3/uL (ref 0.00–0.07)
Basophils Absolute: 0 10*3/uL (ref 0.0–0.1)
Basophils Relative: 1 %
Eosinophils Absolute: 0.2 10*3/uL (ref 0.0–0.5)
Eosinophils Relative: 10 %
HCT: 43.2 % (ref 39.0–52.0)
Hemoglobin: 14.1 g/dL (ref 13.0–17.0)
Immature Granulocytes: 1 %
Lymphocytes Relative: 22 %
Lymphs Abs: 0.5 10*3/uL — ABNORMAL LOW (ref 0.7–4.0)
MCH: 30.9 pg (ref 26.0–34.0)
MCHC: 32.6 g/dL (ref 30.0–36.0)
MCV: 94.5 fL (ref 80.0–100.0)
Monocytes Absolute: 0.2 10*3/uL (ref 0.1–1.0)
Monocytes Relative: 8 %
Neutro Abs: 1.3 10*3/uL — ABNORMAL LOW (ref 1.7–7.7)
Neutrophils Relative %: 58 %
Platelets: 129 10*3/uL — ABNORMAL LOW (ref 150–400)
RBC: 4.57 MIL/uL (ref 4.22–5.81)
RDW: 14.2 % (ref 11.5–15.5)
WBC: 2.1 10*3/uL — ABNORMAL LOW (ref 4.0–10.5)
nRBC: 0 % (ref 0.0–0.2)

## 2018-09-23 LAB — TSH: TSH: 1.268 u[IU]/mL (ref 0.320–4.118)

## 2018-09-23 NOTE — Progress Notes (Signed)
Virtual Visit via Telephone Note  I connected with David [name]. on 09/23/18 at  2:30 PM EDT by telephone and verified that I am speaking with the correct person using two identifiers.  Location: Patient: Home Provider: Office   I discussed the limitations, risks, security and privacy concerns of performing an evaluation and management service by telephone and the availability of in person appointments. I also discussed with the patient that there may be a patient responsible charge related to this service. The patient expressed understanding and agreed to proceed.   History of Present Illness: 78 year old male for follow-up of hypogonadism and an elevated PSA via telephone secondary to COVID-19 pandemic.  He had been on TRT by Dr. Danise Mina and I saw him in January 2020 after his PSA rose to 4.9 and 5.6 on TRT.  After discontinuing the testosterone his PSA dropped 2.8.  He notes significant symptoms off replacement of tiredness, malaise and fatigue.  His testosterone was restarted on 12/17.  A repeat PSA drawn on 09/13/2018 had increased to 4.35.   Observations/Objective: N/A  Assessment and Plan: His PSA has risen x2 on TRT and we discussed the possibility of prostate cancer versus BPH.  If he desires to stay on TRT would recommend further evaluation with a prostate MRI and if abnormalities noted a fusion biopsy.  If he had a negative MRI I would recommend a standard prostate biopsy.  He does have a pacemaker and will need to see if MRI can be performed.  If he is not a candidate for MRI we will proceed with a standard prostate biopsy as he does desire to stay on TRT due to his symptoms.  Follow Up Instructions: As above.   I discussed the assessment and treatment plan with the patient. The patient was provided an opportunity to ask questions and all were answered. The patient agreed with the plan and demonstrated an understanding of the instructions.   The patient was  advised to call back or seek an in-person evaluation if the symptoms worsen or if the condition fails to improve as anticipated.  I provided 12 minutes of non-face-to-face time during this encounter.   Abbie Sons, MD

## 2018-09-23 NOTE — Progress Notes (Signed)
[facility] [place], [place] [address] [phone]   Interval Evaluation  Date of Service: 09/23/18 Patient Name: David [name]. Patient MRN: [id number] Patient DOB: June 21, 1940 Provider: Ventura Sellers, MD  Identifying Statement:  David [name]. is a 78 y.o. male with melanoma, brain metastases  Primary Cancer: Melanoma Stage IV  Oncologic History: Oncology History Overview Note  Metastatic melanoma [facility])   Staging form: Melanoma of the Skin, AJCC 7th Edition     Clinical stage from 09/21/2015: Stage IV [place], [place], [place]) - Signed by [name], MD on 10/09/2015     Malignant melanoma ([place])  09/16/2015 Imaging   PET scan showed a hypermetabolic 7.0VX lingular nodule, and a 21m right upper lobe lung nodule, left subclavicular nodes, and bone metastasis in the proximal right humerus and right femur.    09/21/2015 Initial Diagnosis   [place]   09/21/2015 Initial Biopsy   Left subclavicular lymph node biopsy showed prostatic of melanoma   09/21/2015 Miscellaneous   BRAF V600K mutation (+)    10/04/2015 Imaging   CT head with without contrast showed a 2.0 x 1.6 x 1.7 cm superficial left frontal hyperdense lesion, with postcontrast enhancement, lying within the previous hemorrhagic area, concerning for solitary melanoma metastasis   10/06/2015 - 09/06/2016 Chemotherapy   Nivolumab '240mg'$  every 2 weeks, held due to his dexamethasone for radionecrosis    10/13/2015 - 10/13/2015 Radiation Therapy   10/13/2015 Preop SRS treatment: 14 Gy  to the left frontal lesion in 1 fraction   10/15/2015 Surgery   left front craniotomy and tumor excision    10/15/2015 Pathology Results   left frontal brain mass excision showed metastatic melanoma, 1.5X1.5X0.6cm   01/13/2016 Imaging   CT head without and with contrast showed ill-defined enhancement and dural thickening of the left frontal resection cavity may represent a post  treatment changes or residual neoplasm. A new 6 mm hypodensity in the right cerebellar hemisphere with uncertainty enhancement may represent a metastasis or small brain parenchymal hemorrhage.    01/27/2016 - 01/27/2016 Radiation Therapy   01/27/16 SRS Treatment:  20 Gy in 1 fraction to a 6 mm right cerebellar brain met    02/2016 - 05/24/2018 Chemotherapy   Monthly Xgeva starting 02/2016, changed to every 3 months on 01/30/2018. Stopped on 05/24/2018   03/29/2016 PET scan   PET 03/29/2016 IMPRESSION: 1. Overall there has been an interval response to therapy. The index lesions sided on previous exam it are less hypermetabolic when compared with the previous exam. 2. There is an intramuscular lesion within the right masseter which has increased FDG uptake compared with previous exam. 3. No new sites of disease identified. 4. Aortic atherosclerosis and infrarenal abdominal aortic aneurysm. Recommend followup by ultrasound in 3 years. This recommendation follows ACR consensus guidelines: White Paper of the ACR Incidental Findings Committee II on Vascular Findings. JJoellyn RuedRadiol 2013; 10:789-794   04/20/2016 Imaging   CT Head w wo contrast 1. Mild patchy enhancement at the left anterior frontal lobe treatment site (series 11, image 39) without mass effect and stable surrounding white matter hypodensity without strong evidence of acute vasogenic edema. Recommend continued CT surveillance. 2. No new metastatic disease or new intracranial abnormality identified. 3. Probable benign 14 mm left parietal bone lucent lesion, unchanged since May 2017.   05/06/2016 - 05/08/2016 Hospital Admission   Pt was admitted for rectal bleeding for one day. Breathing  spontaneously resolved, hemoglobin dropped from 15 to 10.5, did not require blood transfusion. Colonoscopy showed no active bleeding, except diverticulosis and hemorrhoid. He was discharged to home.   05/08/2016 Procedure   Colonoscopy  -  Stool in the ascending colon. Fluid aspiration performed. - Diverticulosis in the entire examined colon. - Non-bleeding internal hemorrhoids. - The examination was otherwise normal on direct and retroflexion views.   05/29/2016 Imaging   PET Image Restaging IMPRESSION: 1. Stable exam with no evidence progression. 2. Photopenia in the LEFT frontal lobe at prior surgical site. 3. Stable mildly hypermetabolic RIGHT paratracheal node. This may represent a thyroid nodule. 4. Stable size of minimally metabolic lingular pulmonary nodule. 5. Stable skeletal lesion of the RIGHT iliac crest 6. No cutaneous metabolic lesion.   07/20/2016 Imaging   CT Head W WO Contrast IMPRESSION: Pronounced change in the left frontal region. Much more regional vasogenic edema. Relatively stable appearance of the abnormal enhancement extending from the lateral margin of the frontal horn of left lateral ventricle to the frontal brain surface. New region of abnormal enhancement surrounding the frontal horn of the left lateral ventricle measuring approximately 3.2 cm. The differential diagnosis is recurrent tumor versus radiation necrosis. The patient is reportedly not an MRI candidate. This area is large enough that CT perfusion could possibly make a reasonable differentiation.   09/02/2016 Imaging   CT Head 09/02/16 IMPRESSION: Somewhat mixed pattern of slight posterior progression of pleomorphic enhancement surrounding the LEFT lateral ventricle although no significant mass effect, and reduced vasogenic edema compared with the scan in April.   09/27/2016 - 11/02/2016 Chemotherapy   Start dabrafenib and trametinib 09/27/16, stopped due to severe fatigue     10/05/2016 Imaging   CT Head w & w/o contrast  IMPRESSION: 1. Decreased size of the enhancing area within the left frontal lobe treatment site. Surrounding edema has also decreased. The findings support continued evolution of radiation necrosis. 2. No  new enhancing lesions.   10/18/2016 PET scan   IMPRESSION: 1. New focal hypermetabolism in the proximal sigmoid colon with associated focal colonic wall thickening and mild pericolonic fat stranding at the site of a proximal sigmoid diverticulum, favored to represent mild acute sigmoid diverticulitis. Metastatic disease is considered less likely. Clinical correlation is necessary. Consider appropriate antibiotic therapy, as clinically warranted. Attention to this region recommended on future PET-CT follow-up. 2. Otherwise complete metabolic response with no evidence of hypermetabolic metastatic disease. Lingular pulmonary nodule is slightly decreased in size and demonstrates no significant metabolism. 3. No residual hypermetabolism in the right thyroid lobe nodule, which is stable in size. 4. Aortic Atherosclerosis (ICD10-I70.0). Stable 3.1 cm infrarenal abdominal aortic aneurysm. Aortic aneurysm NOS (ICD10-I71.9). Recommend followup by ultrasound in 3 years. This recommendation follows ACR consensus guidelines: White Paper of the ACR Incidental Findings Committee II on Vascular Findings. Joellyn Rued Radiol 2013; 97:416-384.   11/01/2016 - 11/04/2016 Hospital Admission   Patient admitted to the hospital due to hypercalcemia where he received IV fluids and pamidronate   12/13/2016 PET scan   PET 12/13/16 IMPRESSION: 1. No findings to suggest metastatic disease on today's examination. 2. Previously noted hypermetabolism in the sigmoid colon has resolved, presumably indicative of a focus of diverticulitis on the prior study. Today's study does demonstrate relatively diffuse hypermetabolism throughout the cecum, ascending colon and proximal transverse colon where there is some mild mural thickening, favored to reflect mild chronic inflammation related to underlying diverticular disease. No definite inflammatory changes on the CT portion of the  examination to strongly suggest an active  acute diverticulitis at this time. No discrete lesion to suggest neoplasm. 3. Previously noted 8 mm lingular nodule is stable in size and known remains non hypermetabolic. This is nonspecific but reassuring. Continued attention on future followup studies is recommended. 4. Aortic atherosclerosis, in addition to left main and 3 vessel coronary artery disease. Status post median sternotomy for CABG including LIMA to the LAD. In addition, there is mild fusiform aneurysmal dilatation of the infrarenal abdominal aorta (3.1 cm in diameter) as well as aneurysmal dilatation of the left common iliac   12/18/2016 - 12/31/2017 Chemotherapy   Restarted nivolamab '240mg'$  every 2 weeks, with tapering dose dexamethasone     04/18/2017 Imaging   PET Scan IMPRESSION: 1. No hypermetabolic lesion to suggest active malignancy. 2. Stable 8 mm lingular nodule is not currently hypermetabolic but merit surveillance. Infrarenal abdominal aortic aneurysm 3.1 cm in diameter. Recommend followup by [place] in 3 years. 3. Other imaging findings of potential clinical significance: Postoperative findings in the left frontal lobe. Right renal cyst. Aortic Atherosclerosis (ICD10-I70.0). Osteoarthritis. Lumbar scoliosis. Left knee effusion. Colonic diverticulosis.    10/10/2017 PET scan   IMPRESSION: 1. Small focus of FDG accumulation in the subcutaneous fat of the right gluteal region, nonspecific. Attention on follow-up suggested. 2. Otherwise stable exam without new or progressive hypermetabolic disease in the interval since prior PET-CT. 3. Stable 8 mm nodule in the lingula. No evidence for hypermetabolism on PET imaging. 4.  Aortic Atherosclerois (ICD10-170.0)    01/29/2018 PET scan   IMPRESSION: 1. Small focus of hypermetabolism in the lower left paratracheal neck, which appears to localize to the posterior left thyroid lobe, without discrete thyroid nodule on the CT images, decreased in metabolism since 10/10/2017 PET-CT.  This finding is nonspecific and may represent a hypermetabolic thyroid nodule. Consider thyroid ultrasound correlation. 2. No new hypermetabolic findings suspicious for metastatic disease. Previously described low level metabolism in the subcutaneous right gluteal region has resolved, compatible with resolved inflammatory focus. No evidence of recurrent hypermetabolism within the stable lingular pulmonary nodule. No evidence of recurrent osseous hypermetabolism. 3. Stable infrarenal 3.4 cm Abdominal Aortic Aneurysm (ICD10-I71.9). Recommend follow-up aortic ultrasound in 3 years. This recommendation follows ACR consensus guidelines: White Paper of the ACR Incidental Findings Committee II on Vascular Findings. J Am Coll Radiol 2013; 16:109-604. 4.  Aortic Atherosclerosis (ICD10-I70.0).   09/18/2018 PET scan   PET 09/18/18  IMPRESSION: 1. No findings of active malignancy. The known small right cerebellar mass is not readily apparent on PET-CT but was shown on today's CT head. 2. 3.2 cm infrarenal abdominal aortic aneurysm. Recommend followup by [place] in 3 years. This recommendation follows ACR consensus guidelines: White Paper of the ACR Incidental Findings Committee II on Vascular Findings. J Am Coll Radiol 2013; 10:789-794. Aortic aneurysm NOS (ICD10-I71.9). 3. Other imaging findings of potential clinical significance: Left frontal lobe encephalomalacia underlying the craniotomy site. Chronic paranasal sinusitis. Aortic Atherosclerosis (ICD10-I70.0). Coronary atherosclerosis. Mild cardiomegaly. Colonic diverticulosis. Dextroconvex lumbar scoliosis. Small left knee effusion with mild synovitis. Postoperative findings in the right foot.   09/18/2018 Imaging   CT head  IMPRESSION: 1. No evidence of new intracranial metastases or acute abnormality. 2. Unchanged 4 mm right cerebellar metastasis. 3. Stable post treatment changes in the left frontal lobe.     Interval History:  David [name]. presents today for follow up.  He describes no new or progressive neurologic deficits.  Short term memory is still impaired as  prior, not worsened.  No seizures or headaches.     Medications: Current Outpatient Medications on File Prior to Visit  Medication Sig Dispense Refill   acetaminophen (TYLENOL) 500 MG tablet Take 500 mg by mouth at bedtime.     amLODipine (NORVASC) 5 MG tablet TAKE ONE TABLET BY MOUTH EVERY DAY 32 tablet 11   aspirin EC 81 MG tablet Take 81 mg by mouth daily.     Cholecalciferol (VITAMIN D3) 1000 units CAPS Take 1 capsule (1,000 Units total) by mouth daily. 30 capsule    citalopram (CELEXA) 20 MG tablet TAKE ONE TABLET BY MOUTH EVERY DAY 90 tablet 0   fluocinonide cream (LIDEX) 0.05 % Apply topically 2 (two) times daily as needed. 30 g 0   ketoconazole (NIZORAL) 2 % shampoo Apply 1 application topically 2 (two) times a week. 120 mL 1   levothyroxine (SYNTHROID, LEVOTHROID) 137 MCG tablet TAKE 1 TABLET BY MOUTH ONCE DAILY BEFORE BREAKFAST 30 tablet 11   methylphenidate (RITALIN) 10 MG tablet Take 1 tablet (10 mg total) by mouth daily. 30 tablet 0   metoprolol succinate (TOPROL-XL) 50 MG 24 hr tablet TAKE 1 AND 1/2 TABLET (75 MG) BY MOUTH EVERY DAY WITH OR IMMEDIATELY FOLLOWING A MEAL *DO NOT CRUSH* 50 tablet 11   pentoxifylline (TRENTAL) 400 MG CR tablet Take 1 tablet (400 mg total) by mouth 2 (two) times daily. 60 tablet 6   rosuvastatin (CRESTOR) 20 MG tablet TAKE ONE TABLET BY MOUTH AT BEDTIME 30 tablet 8   testosterone cypionate (DEPOTESTOTERONE CYPIONATE) 100 MG/ML injection Inject 1 mL (100 mg total) into the muscle every 14 (fourteen) days. 10 mL 0   Turmeric 500 MG CAPS Take 2 capsules by mouth daily.     vitamin E (VITAMIN E) 400 UNIT capsule Take 1 capsule (400 Units total) by mouth 2 (two) times daily. 60 capsule 6   Current Facility-Administered Medications on File Prior to Visit  Medication Dose Route Frequency Provider Last  Rate Last Dose   0.9 %  sodium chloride infusion  1,000 mL Intravenous Once [name], MD        Allergies:  Allergies  Allergen Reactions   Ezetimibe    Simvastatin Other (See Comments)    Reaction:  Gave pt a fever  Fever - temp of 103.   In 2003   Past Medical History:  Past Medical History:  Diagnosis Date   Anginal pain ([place])    Arthritis    mostly hands   Benign familial hematuria    BPH (benign prostatic hypertrophy)    Brain cancer ([facility])    melanoma with brain met   Coronary artery disease    GERD (gastroesophageal reflux disease)    High cholesterol    Hypertension    Hypothyroidism    Intracerebral hemorrhage ([facility])    Melanoma ([place])    Metastatic melanoma to lung ([facility])    Mood disorder ([facility])    Myocardial infarction [facility])    Pacemaker    due to syncope, 3rd degree HB (upgrade to St. Jude CRT-P 02/25/13 (Dr. Uvaldo Rising)   Rectal bleed    due to NSAIDS   Renal disorder    Skin cancer    melanoma   Sleep apnea    uses CPAP   Stroke [facility])    Past Surgical History:  Past Surgical History:  Procedure Laterality Date   APPLICATION OF CRANIAL NAVIGATION N/A 10/15/2015   Procedure: APPLICATION OF CRANIAL NAVIGATION;  Surgeon: Erline Levine, MD;  Location: [place] NEURO ORS;  Service: Neurosurgery;  Laterality: N/A;   CARDIAC CATHETERIZATION  2013   CARDIAC SURGERY     bypass X 2   COLONOSCOPY WITH PROPOFOL N/A 05/08/2016   diverticulosis, int hem, no f/u needed [name])   CORONARY ARTERY BYPASS GRAFT  2013   LIMA-LAD, SVG-PDA 03/27/11 (Dr. Francee Gentile)   [place] N/A 10/15/2015   Procedure: LEFT FRONTAL CRANIOTOMY TUMOR EXCISION with Curve;  Surgeon: Erline Levine, MD;  Location: [place] NEURO ORS;  Service: Neurosurgery;  Laterality: N/A;  CRANIOTOMY TUMOR EXCISION   EYE SURGERY     FOOT SURGERY  08/2010   JOINT REPLACEMENT Left    partial knee   KNEE ARTHROSCOPY  12/2008   LYMPH NODE BIOPSY     PACEMAKER INSERTION     TONSILLECTOMY      Social History:  Social History   Socioeconomic History   Marital status: Married    Spouse name: Not on file   Number of children: 2   Years of education: Not on file   Highest education level: Not on file  Occupational History   Occupation: retired Programme researcher, broadcasting/film/video strain: Not on file   Food insecurity    Worry: Not on file    Inability: Not on file   Transportation needs    Medical: Not on file    Non-medical: Not on file  Tobacco Use   Smoking status: Former Smoker    Packs/day: 1.00    Years: 3.00    Pack years: 3.00    Quit date: 04/10/1957    Years since quitting: 61.4   Smokeless tobacco: Never Used  Substance and Sexual Activity   Alcohol use: Yes    Alcohol/week: 14.0 standard drinks    Types: 10 Glasses of wine, 4 Cans of beer per week   Drug use: No   Sexual activity: Not Currently  Lifestyle   Physical activity    Days per week: Not on file    Minutes per session: Not on file   Stress: Not on file  Relationships   Social connections    Talks on phone: Not on file    Gets together: Not on file    Attends religious service: Not on file    Active member of club or organization: Not on file    Attends meetings of clubs or organizations: Not on file    Relationship status: Not on file   Intimate partner violence    Fear of current or ex partner: Not on file    Emotionally abused: Not on file    Physically abused: Not on file    Forced sexual activity: Not on file  Other Topics Concern   Not on file  Social History Narrative   Lives with wife [name]) with dementia in [facility]   Daughter [name] and son   Occ: retired Pharmacist, community practiced in [employer]   Has living will   [name] is health care POA   Requests DNR-- done 09/14/15   Not sure about tube feeds   Family History:  Family History  Problem Relation Age of Onset   Cancer Mother 27       lung    Hypertension Mother     Hypertension Father    Heart attack Father    Heart disease Father    Rheum arthritis Sister    Cancer Maternal Grandmother     Review of Systems: Constitutional: Denies fevers,  chills or abnormal weight loss Eyes: Denies blurriness of vision Ears, nose, mouth, throat, and face: Denies mucositis or sore throat Respiratory: Denies cough, dyspnea or wheezes Cardiovascular: Denies palpitation, chest discomfort or lower extremity swelling Gastrointestinal:  Denies nausea, constipation, diarrhea GU: Denies dysuria or incontinence Skin: Denies abnormal skin rashes Neurological: Per HPI Musculoskeletal: Denies joint pain, back or neck discomfort. No decrease in ROM Behavioral/Psych: Denies anxiety, disturbance in thought content, and mood instability   Physical Exam:  09/23/18 09/10/18 06/03/18  BP 125/87 116/86 110/64  Pulse Rate 75 75 74  Resp 18 -- 14  Temp 98.5 F (36.9 C) 97.7 F (36.5 C) 97.4 F (36.3 C)  Temp Source Oral -- --  SpO2 95 % -- 95 %  Weight 209 lb 14.4 oz (95.2 kg) 200 lb (90.7 kg) 209 lb 3 oz (94.9 kg)  Height 5' 9.5" (1.765 m) 5' 9.5" (1.765 m) 5' 9.5" (1.765 m)  Pain Score 3     KPS: 70. General: Alert, cooperative, pleasant, in no acute distress Head: Craniotomy scar noted, dry and intact. EENT: No conjunctival injection or scleral icterus. Oral mucosa moist Lungs: Resp effort normal Cardiac: Regular rate and rhythm Abdomen: Soft, non-distended abdomen Skin: No rashes cyanosis or petechiae. Extremities: No clubbing or edema  Neurologic Exam: Mental Status: Awake, alert, attentive to examiner. Oriented to self and environment. Language is fluent with intact comprehension.  Age advanced psychomotor slowing.  Limited insight at times.  Cranial Nerves: Visual acuity is grossly normal. Visual fields are full. Extra-ocular movements intact. No ptosis. Face is symmetric, tongue midline. Motor: Tone and bulk are normal. Power is impaired to 4+/5 right leg,  flexors>extendors. Subtle atrophy appreciated in left proximal hip girdle musculature. Reflexes are symmetric, no pathologic reflexes present. Intact finger to nose bilaterally Sensory: Intact to light touch and temperature Gait: Mildly hemiparetic   Labs: I have reviewed the data as listed    Component Value Date/Time   NA 141 09/23/2018 1001   NA 140 03/26/2017 0928   K 4.8 09/23/2018 1001   K 4.7 03/26/2017 0928   CL 109 09/23/2018 1001   CO2 23 09/23/2018 1001   CO2 21 (L) 03/26/2017 0928   GLUCOSE 89 09/23/2018 1001   GLUCOSE 95 03/26/2017 0928   BUN 32 (H) 09/23/2018 1001   BUN 19.3 03/26/2017 0928   CREATININE 1.56 (H) 09/23/2018 1001   CREATININE 1.77 (H) 05/24/2018 1445   CREATININE 1.2 03/26/2017 0928   CALCIUM 9.1 09/23/2018 1001   CALCIUM 8.7 03/26/2017 0928   PROT 6.7 09/23/2018 1001   PROT 6.8 03/26/2017 0928   ALBUMIN 3.9 09/23/2018 1001   ALBUMIN 3.7 03/26/2017 0928   AST 24 09/23/2018 1001   AST 18 05/24/2018 1445   AST 22 03/26/2017 0928   ALT 25 09/23/2018 1001   ALT 13 05/24/2018 1445   ALT 15 03/26/2017 0928   ALKPHOS 49 09/23/2018 1001   ALKPHOS 58 03/26/2017 0928   BILITOT 0.5 09/23/2018 1001   BILITOT 0.6 05/24/2018 1445   BILITOT 0.58 03/26/2017 0928   GFRNONAA 42 (L) 09/23/2018 1001   GFRNONAA 36 (L) 05/24/2018 1445   GFRAA 48 (L) 09/23/2018 1001   GFRAA 42 (L) 05/24/2018 1445   Lab Results  Component Value Date   WBC 2.1 (L) 09/23/2018   NEUTROABS 1.3 (L) 09/23/2018   HGB 14.1 09/23/2018   HCT 43.2 09/23/2018   MCV 94.5 09/23/2018   PLT 129 (L) 09/23/2018   Imaging:  [place] Clinician Interpretation:  I have personally reviewed the CNS images as listed.  My interpretation, in the context of the patient's clinical presentation, is stable disease  Ct Head W & Wo Contrast  Result Date: 09/18/2018 CLINICAL DATA:  Follow-up metastatic melanoma. EXAM: CT HEAD WITHOUT AND WITH CONTRAST TECHNIQUE: Contiguous axial images were obtained from  the base of the skull through the vertex without and with intravenous contrast CONTRAST:  38m OMNIPAQUE IOHEXOL 300 MG/ML  SOLN COMPARISON:  02/14/2018 FINDINGS: Brain: A 4 mm enhancing lesion in the right cerebellum has not significantly changed in size and is most conspicuous on the sagittal reformats (series 6, image 18). Encephalomalacia and gliosis in the left frontal lobe with associated minimal enhancement is unchanged. There is chronic ex vacuo dilatation of the left frontal horn. No new enhancing intracranial lesions are identified. Subtle 4 mm inferior right frontal lobe density on the prior study is no longer seen and was likely secondary to volume averaging. No acute large territory infarct, midline shift, or extra-axial fluid collection is identified. There is a background of mild chronic small vessel ischemia in the cerebral white matter bilaterally. Vascular: No hyperdense vessel. Major dural venous sinuses are grossly patent. Skull: Left frontal craniotomy. Unchanged 12 mm lucency in the left parietal skull. Sinuses/Orbits: Extensive left greater than right ethmoid and frontal sinus mucosal thickening. Small volume secretions in the left sphenoid and left maxillary sinuses. Clear mastoid air cells. Bilateral cataract extraction. Other: None. IMPRESSION: 1. No evidence of new intracranial metastases or acute abnormality. 2. Unchanged 4 mm right cerebellar metastasis. 3. Stable post treatment changes in the left frontal lobe. Electronically Signed   By: ALogan BoresM.D.   On: 09/18/2018 11:47   Nm Pet Image Restage (ps) Whole Body  Result Date: 09/18/2018 CLINICAL DATA:  Subsequent treatment strategy for malignant melanoma. EXAM: NUCLEAR MEDICINE PET WHOLE BODY TECHNIQUE: 10.6 mCi F-18 FDG was injected intravenously. Full-ring PET imaging was performed from the skull base to thigh after the radiotracer. CT data was obtained and used for attenuation correction and anatomic localization. Fasting  blood glucose: 87 mg/dl COMPARISON:  Multiple exams, including 01/29/2018 FINDINGS: Mediastinal blood pool activity: SUV max 2.4 HEAD/NECK: Stable hypo activity in the left frontal lobe with underlying encephalomalacia dilation of the frontal horn of left lateral ventricle. On 02/14/2018 the patient had a small right cerebellar metastatic lesion which is not readily apparent on today's PET-CT, although MRI would be the most sensitive modality. The previous left lower neck accentuated metabolic activity is no longer appreciable. Incidental CT findings: Chronic left frontal, ethmoid, bilateral maxillary, and right sphenoid sinusitis. Prior left frontal craniotomy. Bilateral common carotid atherosclerotic vascular calcification. Very small left supraclavicular lymph node 0.5 cm in short axis on image 94/3, not appreciably hypermetabolic. CHEST: Mildly accentuated activity along the margins of the pacemaker device, likely inflammatory or reactive. No findings of active malignant involvement of the chest. Incidental CT findings: Coronary, aortic arch, and branch vessel atherosclerotic vascular disease. Prior CABG. Mild cardiomegaly. ABDOMEN/PELVIS: No significant abnormal hypermetabolic activity in this region. Incidental CT findings: Contrast medium is present in both renal collecting systems, left over from the patient's prior head CT. Right kidney upper pole fluid density lesion compatible with cyst. Colonic diverticulosis. Infrarenal abdominal aortic aneurysm, 3.2 cm in diameter. SKELETON: No significant abnormal hypermetabolic activity in this region. Incidental CT findings: Dextroconvex lumbar scoliosis. Left unicompartmental knee prosthesis. Postoperative findings in the right calcaneus. Postoperative findings in the right first metatarsal. EXTREMITIES: Accentuated activity along the margin  of the patient's small left knee effusion compatible with mild synovitis. Incidental CT findings: none IMPRESSION: 1. No  findings of active malignancy. The known small right cerebellar mass is not readily apparent on PET-CT but was shown on today's CT head. 2. 3.2 cm infrarenal abdominal aortic aneurysm. Recommend followup by [place] in 3 years. This recommendation follows ACR consensus guidelines: White Paper of the ACR Incidental Findings Committee II on Vascular Findings. J Am Coll Radiol 2013; 10:789-794. Aortic aneurysm NOS (ICD10-I71.9). 3. Other imaging findings of potential clinical significance: Left frontal lobe encephalomalacia underlying the craniotomy site. Chronic paranasal sinusitis. Aortic Atherosclerosis (ICD10-I70.0). Coronary atherosclerosis. Mild cardiomegaly. Colonic diverticulosis. Dextroconvex lumbar scoliosis. Small left knee effusion with mild synovitis. Postoperative findings in the right foot. Electronically Signed   By: Van Clines M.D.   On: 09/18/2018 13:04    Assessment/Plan 1. Brain metastases ([place])  2. Cognitive Impairment  Mr. Petrovic is clinically and radiographically stable today.  Suspect recent subjective complaints are related to endocrine issues, not brain tumor.  We recommend he return to clinic in 8 months with a CT head for evaluation.    He will continue to follow with Dr. Burr Medico for his systemic therapy.  We appreciate the opportunity to participate in the care of Kimberley Dastrup..   All questions were answered. The patient knows to call the clinic with any problems, questions or concerns. No barriers to learning were detected.  The total time spent in the encounter was 25 minutes minutes and more than 50% was on counseling and review of test results   Ventura Sellers, MD Medical Director of Neuro-Oncology [facility] at [place] 09/23/18 11:02 AM

## 2018-09-24 ENCOUNTER — Telehealth: Payer: Self-pay | Admitting: *Deleted

## 2018-09-24 ENCOUNTER — Telehealth: Payer: Self-pay | Admitting: Hematology

## 2018-09-24 NOTE — Telephone Encounter (Signed)
Scheduled appt per 6/15 los. Printed and mailed calendar.

## 2018-09-24 NOTE — Telephone Encounter (Signed)
Scheduled appt per 6/15 los. Called patient and patient aware of appt date and time.

## 2018-09-24 NOTE — Telephone Encounter (Signed)
Spoke with pt and informed him of lab results as per Dr. Ernestina Penna instructions below.  Pt voiced understanding.

## 2018-09-24 NOTE — Telephone Encounter (Signed)
-----   Message from [name], MD sent at 09/24/2018  9:54 AM EDT ----- Please let pt know his TSH was normal yesterday, CKD stable, no other concerns, thanks  [name]  09/24/2018

## 2018-10-01 ENCOUNTER — Ambulatory Visit (INDEPENDENT_AMBULATORY_CARE_PROVIDER_SITE_OTHER): Payer: Medicare Other | Admitting: Internal Medicine

## 2018-10-01 ENCOUNTER — Other Ambulatory Visit: Payer: Self-pay

## 2018-10-01 ENCOUNTER — Encounter: Payer: Self-pay | Admitting: Internal Medicine

## 2018-10-01 VITALS — BP 132/70 | HR 75 | Ht 69.5 in | Wt 211.0 lb

## 2018-10-01 DIAGNOSIS — Z950 Presence of cardiac pacemaker: Secondary | ICD-10-CM | POA: Diagnosis not present

## 2018-10-01 DIAGNOSIS — I442 Atrioventricular block, complete: Secondary | ICD-10-CM | POA: Diagnosis not present

## 2018-10-01 DIAGNOSIS — I255 Ischemic cardiomyopathy: Secondary | ICD-10-CM

## 2018-10-01 NOTE — Progress Notes (Signed)
Patient Care Team: [name], MD as PCP - General (Family Medicine)   HPI  David [name]. is a 78 y.o. male Seen in followup for [facility] Jude.  Hx of PM 1998 with upgrade 2014   Hx of ischemic heart disease and prior CABG 2014; denies chest pain.  He does not have edema.  He has some mild shortness of breath with exertion.  He has stage 4 melanoma with brain mets and neuro complications including memory and aphasia; treated sleep apnea DATE TEST EF   11/12 Cardiac CTA  T LAD  11/12 Myoview 42% Inferoapical scar  11/12 Echo 45-50%   6/18 Echo   60-65 %          He is DNR    Denies chest pain sob or even signif fatigue   Biggest limitation is his arthritis affecting mobility   Has need of prostate procedure  Requesting information re Pacemaker  Records and Results Reviewed   Past Medical History:  Diagnosis Date  . Anginal pain ([place])   . Arthritis    mostly hands  . Benign familial hematuria   . BPH (benign prostatic hypertrophy)   . Brain cancer ([place])    melanoma with brain met  . Coronary artery disease   . GERD (gastroesophageal reflux disease)   . High cholesterol   . Hypertension   . Hypothyroidism   . Intracerebral hemorrhage ([place])   . Melanoma ([place])   . Metastatic melanoma to lung ([place])   . Mood disorder ([place])   . Myocardial infarction ([place])   . Pacemaker    due to syncope, 3rd degree HB (upgrade to [facility]. Jude CRT-P 02/25/13 (Dr. Uvaldo Rising)  . Rectal bleed    due to NSAIDS  . Renal disorder   . Skin cancer    melanoma  . Sleep apnea    uses CPAP  . Stroke [facility])     Past Surgical History:  Procedure Laterality Date  . APPLICATION OF CRANIAL NAVIGATION N/A 10/15/2015   Procedure: APPLICATION OF CRANIAL NAVIGATION;  Surgeon: Erline Levine, MD;  Location: [place] NEURO ORS;  Service: Neurosurgery;  Laterality: N/A;  . CARDIAC CATHETERIZATION  2013  . CARDIAC SURGERY     bypass X 2  . COLONOSCOPY WITH PROPOFOL N/A 05/08/2016   diverticulosis, int hem, no f/u needed [name])  . CORONARY ARTERY BYPASS GRAFT  2013   LIMA-LAD, SVG-PDA 03/27/11 (Dr. Francee Gentile)  . CRANIOTOMY N/A 10/15/2015   Procedure: LEFT FRONTAL CRANIOTOMY TUMOR EXCISION with Curve;  Surgeon: Erline Levine, MD;  Location: [place] NEURO ORS;  Service: Neurosurgery;  Laterality: N/A;  CRANIOTOMY TUMOR EXCISION  . EYE SURGERY    . FOOT SURGERY  08/2010  . JOINT REPLACEMENT Left    partial knee  . KNEE ARTHROSCOPY  12/2008  . LYMPH NODE BIOPSY    . PACEMAKER INSERTION    . TONSILLECTOMY      Current Meds  Medication Sig  . acetaminophen (TYLENOL) 500 MG tablet Take 500 mg by mouth at bedtime.  Marland Kitchen amLODipine (NORVASC) 5 MG tablet TAKE ONE TABLET BY MOUTH EVERY DAY  . aspirin EC 81 MG tablet Take 81 mg by mouth daily.  . Cholecalciferol (VITAMIN D3) 1000 units CAPS Take 1 capsule (1,000 Units total) by mouth daily.  . citalopram (CELEXA) 20 MG tablet TAKE ONE TABLET BY MOUTH EVERY DAY  . fluocinonide cream (LIDEX) 0.05 % Apply topically 2 (two) times daily as needed.  Marland Kitchen ketoconazole (  NIZORAL) 2 % shampoo Apply 1 application topically 2 (two) times a week.  . levothyroxine (SYNTHROID, LEVOTHROID) 137 MCG tablet TAKE 1 TABLET BY MOUTH ONCE DAILY BEFORE BREAKFAST  . methylphenidate (RITALIN) 10 MG tablet Take 1 tablet (10 mg total) by mouth daily.  . metoprolol succinate (TOPROL-XL) 50 MG 24 hr tablet TAKE 1 AND 1/2 TABLET (75 MG) BY MOUTH EVERY DAY WITH OR IMMEDIATELY FOLLOWING A MEAL *DO NOT CRUSH*  . pentoxifylline (TRENTAL) 400 MG CR tablet Take 1 tablet (400 mg total) by mouth 2 (two) times daily.  . rosuvastatin (CRESTOR) 20 MG tablet TAKE ONE TABLET BY MOUTH AT BEDTIME  . testosterone cypionate (DEPOTESTOTERONE CYPIONATE) 100 MG/ML injection Inject 1 mL (100 mg total) into the muscle every 14 (fourteen) days.  . Turmeric 500 MG CAPS Take 2 capsules by mouth daily.  . vitamin E (VITAMIN E) 400 UNIT capsule Take 1 capsule (400 Units total) by mouth 2 (two) times  daily.    Allergies  Allergen Reactions  . Ezetimibe   . Simvastatin Other (See Comments)    Reaction:  Gave pt a fever  Fever - temp of 103.   In 2003      Review of Systems negative except from HPI and PMH  Physical Exam Ht 5' 9.5" (1.765 m)   Wt 211 lb (95.7 kg)   BMI 30.71 kg/m  Well developed and well nourished in no acute distress HENT normal Neck supple with JVP-flat Clear Device pocket well healed; without hematoma or erythema.  There is no tethering  Regular rate and rhythm,  Abd-soft  No Clubbing cyanosis  tr edema Skin-warm and dry A & Oriented  Grossly normal sensory and motor function  ECG AV pacing with upright QRS V1 and neg lead 1  Device interrogation from 5/20 >> normal device function with longevity > 1 yrs Ap/Vp >99%   Assessment and  Plan  Complete heart block device dependent   Ischemic cardiomyopathy-interval resolution  Coronary disease with prior bypass  CRT-P [facility] Jude?  Device #4  Melanoma-brain status post surgical resection apparently disease-free  Prostate --need for biopsy    Cardiac function stable  Without symptoms of ischemia  Device function normal, ERI approaching  He is noted to be DNR, but as he is being considered for prostate biopsy/surgery that it would be the choice to replace his pacemaker   Insomnia--have broached the conversation whether benzo are appropriate given the status  Called [facility] with pacemaker information requested    Current medicines are reviewed at length with the patient today .  The patient does not  have concerns regarding medicines.

## 2018-10-01 NOTE — Progress Notes (Signed)
ekg 

## 2018-10-01 NOTE — Patient Instructions (Signed)
Medication Instructions:  - Your physician recommends that you continue on your current medications as directed. Please refer to the Current Medication list given to you today.  If you need a refill on your cardiac medications before your next appointment, please call your pharmacy.   Lab work: - none ordered  If you have labs (blood work) drawn today and your tests are completely normal, you will receive your results only by: Marland Kitchen MyChart Message (if you have MyChart) OR . A paper copy in the mail If you have any lab test that is abnormal or we need to change your treatment, we will call you to review the results.  Testing/Procedures: - none ordered  Follow-Up: At [facility], you and your health needs are our priority.  As part of our continuing mission to provide you with exceptional heart care, we have created designated Provider Care Teams.  These Care Teams include your primary Cardiologist (physician) and Advanced Practice Providers (APPs -  Physician Assistants and Nurse Practitioners) who all work together to provide you with the care you need, when you need it.  You will need a follow up appointment in 1 year with Dr. Caryl Comes.  (June 2021) . Please call our office 2 months in advance to schedule this appointment.  (call in early April to schedule)  Remote monitoring is used to monitor your Pacemaker of ICD from home. This monitoring reduces the number of office visits required to check your device to one time per year. It allows [place] to keep an eye on the functioning of your device to ensure it is working properly. You are scheduled for a device check from home on 12/04/2018. You may send your transmission at any time that day. If you have a wireless device, the transmission will be sent automatically. After your physician reviews your transmission, you will receive a postcard with your next transmission date.   Any Other Special Instructions Will Be Listed Below (If Applicable). -  N/A

## 2018-10-02 ENCOUNTER — Telehealth: Payer: Self-pay | Admitting: Urology

## 2018-10-02 NOTE — Telephone Encounter (Signed)
Please let the patient know he cannot have an MRI because of his pacemaker.  If he wants to continue testosterone we will need to schedule a prostate biopsy.

## 2018-10-02 NOTE — Telephone Encounter (Signed)
FYI Waiting on scheduling to let me know if he can have the MRI Patient had his pacemaker done @ [place] in 2014 SN is [id number] device dependent Per Dr. Jolyn Nap   Thanks, [name]

## 2018-10-02 NOTE — Telephone Encounter (Signed)
I just heard back from MRI and was told that his pacemaker is not safe for him to have the MRI   [name]

## 2018-10-03 NOTE — Telephone Encounter (Signed)
Patient agreed tot he BX and app was made Instructions were given over the phone and mailed to patient   [name]

## 2018-10-04 ENCOUNTER — Telehealth: Payer: Self-pay

## 2018-10-04 NOTE — Telephone Encounter (Signed)
Pt left v/m that he has cut (did not leave name of med) in half and people at [facility] advised pt to call PCP to see if should cut med in half. I called pt to get name of med and his wife said he had just left and pt will call [facility] back.

## 2018-10-04 NOTE — Telephone Encounter (Signed)
Noted.  Left message for pt to call back.

## 2018-10-07 ENCOUNTER — Other Ambulatory Visit: Payer: Self-pay | Admitting: *Deleted

## 2018-10-07 MED ORDER — CITALOPRAM HYDROBROMIDE 10 MG PO TABS: 10.0000 mg | ORAL_TABLET | Freq: Every day | ORAL | 1 refills | Status: DC

## 2018-10-07 NOTE — Telephone Encounter (Signed)
Called in refill to pharmacy 

## 2018-10-07 NOTE — Telephone Encounter (Addendum)
Citalopram 10mg  sent to [facility].  Please phone in due to E prescribing error.

## 2018-10-07 NOTE — Telephone Encounter (Signed)
Patient left a voicemail requesting a refill on Testosterone. Last refill 03/26/18   10 ML Last office visit 09/10/18 Patient stated that he is having difficulty sleeping and his daughter suggested Valium. Patient wants to know if you will prescribe something to help him sleep?  Pharmacy Neil/

## 2018-10-07 NOTE — Addendum Note (Signed)
Addended by: [name] on: 10/07/2018 03:12 PM   Modules accepted: Orders

## 2018-10-07 NOTE — Telephone Encounter (Signed)
Spoke with pt asking which med he is taking 1/2 tab of.  States it is the citalopram 20 mg. States a whole tab keeps him awake at night.  Pt is requesting new rx for 10 mg tab.

## 2018-10-10 MED ORDER — TESTOSTERONE CYPIONATE 100 MG/ML IM SOLN: 100.0000 mg | INTRAMUSCULAR | 0 refills | Status: DC

## 2018-10-10 NOTE — Telephone Encounter (Addendum)
Attempted to E prescribe testosterone.  Please phone in due to E prescribing error.  As far as sleep - plz schedule virtual visit to review insomnia as I don't think we've talked about this in the past. Alternatively could try melatonin 5mg  at night (sleep hormone).

## 2018-10-10 NOTE — Telephone Encounter (Signed)
Spoke with Butch Penny at [facility] providing verbal refill info for testosterone.  Per Butch Penny, they are no longer located on [address], now on [address]. Updated pt's pharmacy list.  Spoke with pt relaying Dr. Synthia Innocent message about the insomnia.  Pt states he has tried melatonin and does not work.  Scheduled virtual visit on 10/14/18 at 3:00.

## 2018-10-14 ENCOUNTER — Ambulatory Visit (INDEPENDENT_AMBULATORY_CARE_PROVIDER_SITE_OTHER): Payer: Medicare Other | Admitting: Family Medicine

## 2018-10-14 ENCOUNTER — Encounter: Payer: Self-pay | Admitting: Family Medicine

## 2018-10-14 VITALS — BP 130/84 | HR 75 | Ht 69.5 in | Wt 205.0 lb

## 2018-10-14 DIAGNOSIS — G4700 Insomnia, unspecified: Secondary | ICD-10-CM

## 2018-10-14 MED ORDER — TRAZODONE HCL 50 MG PO TABS: 25.0000 mg | ORAL_TABLET | Freq: Every day | ORAL | 1 refills | Status: AC

## 2018-10-14 NOTE — Progress Notes (Signed)
Virtual visit attempted through [employer]. Due to national recommendations of social distancing due to COVID-19, a virtual visit is felt to be most appropriate for this patient at this time. Reviewed limitations of a virtual visit.  Interactive audio and video telecommunications were attempted between myself and Augusto Deckman., however failed due to patient having technical difficulties OR patient not having access to video capability.  We continued and completed visit with audio only.  Time: 3:19pm - 3:37pm   Patient location: home Provider location: [place] at [facility], office If any vitals were documented, they were collected by patient at home unless specified below.    BP 130/84    Pulse 75    Ht 5' 9.5" (1.765 m)    Wt 205 lb (93 kg)    BMI 29.84 kg/m    CC: insomnia Subjective:    Patient ID: David [name]., male    DOB: 06-18-1940, 78 y.o.   MRN: [id number]  HPI: David [name]. is a 78 y.o. male presenting on 10/14/2018 for Insomnia (C/o trouble falling asleep.  Tried OTC melatonin, not helpful. )   Ongoing insomnia for several months. He tried decreasing celexa to 10mg  daily without much benefit. More trouble falling asleep - takes 1-2 hours to fall asleep. Finds alcohol helps him sleep but he doesn't want to use this to sleep. Discussed metabolism of alcohol can also keep him awake.   Tried OTC melatonin 5-10mg  without benefit - has been on this for several months as well.  Bedtime 11pm. Wakes up at 7-8am.  Nocturia x1.   Doesn't regularly use ritalin.  Taking celexa in the morning.  Reviewed bedtime routine - watches MSNBC, CNN, at bedtime sometimes watches in the bedroom. Limits caffeine to am. 2 hours between dinner and bedtime.   Endorses using alcohol to help sleep but he is trying to back off this due to excess calories from alcohol.      Relevant past medical, surgical, family and social history reviewed and updated as indicated. Interim  medical history since our last visit reviewed. Allergies and medications reviewed and updated. Outpatient Medications Prior to Visit  Medication Sig Dispense Refill   acetaminophen (TYLENOL) 500 MG tablet Take 500 mg by mouth at bedtime.     amLODipine (NORVASC) 5 MG tablet TAKE ONE TABLET BY MOUTH EVERY DAY 32 tablet 11   aspirin EC 81 MG tablet Take 81 mg by mouth daily.     Cholecalciferol (VITAMIN D3) 1000 units CAPS Take 1 capsule (1,000 Units total) by mouth daily. 30 capsule    citalopram (CELEXA) 10 MG tablet Take 1 tablet (10 mg total) by mouth daily. 90 tablet 1   fluocinonide cream (LIDEX) 0.05 % Apply topically 2 (two) times daily as needed. 30 g 0   ketoconazole (NIZORAL) 2 % shampoo Apply 1 application topically 2 (two) times a week. 120 mL 1   levothyroxine (SYNTHROID, LEVOTHROID) 137 MCG tablet TAKE 1 TABLET BY MOUTH ONCE DAILY BEFORE BREAKFAST 30 tablet 11   methylphenidate (RITALIN) 10 MG tablet Take 1 tablet (10 mg total) by mouth daily. 30 tablet 0   metoprolol succinate (TOPROL-XL) 50 MG 24 hr tablet TAKE 1 AND 1/2 TABLET (75 MG) BY MOUTH EVERY DAY WITH OR IMMEDIATELY FOLLOWING A MEAL *DO NOT CRUSH* 50 tablet 11   pentoxifylline (TRENTAL) 400 MG CR tablet Take 1 tablet (400 mg total) by mouth 2 (two) times daily. 60 tablet 6   rosuvastatin (CRESTOR) 20  MG tablet TAKE ONE TABLET BY MOUTH AT BEDTIME 30 tablet 8   testosterone cypionate (DEPOTESTOTERONE CYPIONATE) 100 MG/ML injection Inject 1 mL (100 mg total) into the muscle every 14 (fourteen) days. 10 mL 0   Turmeric 500 MG CAPS Take 2 capsules by mouth daily.     vitamin E (VITAMIN E) 400 UNIT capsule Take 1 capsule (400 Units total) by mouth 2 (two) times daily. 60 capsule 6   Facility-Administered Medications Prior to Visit  Medication Dose Route Frequency Provider Last Rate Last Dose   0.9 %  sodium chloride infusion  1,000 mL Intravenous Once [name], MD         Per HPI unless specifically  indicated in ROS section below Review of Systems Objective:    BP 130/84    Pulse 75    Ht 5' 9.5" (1.765 m)    Wt 205 lb (93 kg)    BMI 29.84 kg/m   Wt Readings from Last 3 Encounters:  10/14/18 205 lb (93 kg)  10/01/18 211 lb (95.7 kg)  09/23/18 209 lb 14.4 oz (95.2 kg)     Physical exam: Gen: alert, NAD, not ill appearing Pulm: speaks in complete sentences without increased work of breathing Psych: normal mood, normal thought content       Assessment & Plan:  He asks about stopping acetaminophen prior to urological procedure. Advised I don't know of any contra indication to tylenol use peri-procedure but if that is what it says on his instructions then he should hold it as advised.  Problem List Items Addressed This Visit    Insomnia - Primary    Reviewed sleep hygiene measures. Encouraged finding different time to watch the news besides bedtime as this may be more mentally stimulating.  Melatonin 5-10mg  ineffective.  Encouraged avoiding alcohol at bedtime.  Will trial trazodone 25-50mg  nightly. He will call us in 1-2 wks with effect. If not helpful, would consider low dose benzo - reviewed risks of these meds including habit forming nature, dependence, tolerance potential.           Meds ordered this encounter  Medications   traZODone (DESYREL) 50 MG tablet    Sig: Take 0.5-1 tablets (25-50 mg total) by mouth at bedtime.    Dispense:  30 tablet    Refill:  1   No orders of the defined types were placed in this encounter.   I discussed the assessment and treatment plan with the patient. The patient was provided an opportunity to ask questions and all were answered. The patient agreed with the plan and demonstrated an understanding of the instructions. The patient was advised to call back or seek an in-person evaluation if the symptoms worsen or if the condition fails to improve as anticipated.  Follow up plan: No follow-ups on file.  [name], MD

## 2018-10-14 NOTE — Assessment & Plan Note (Addendum)
Reviewed sleep hygiene measures. Encouraged finding different time to watch the news besides bedtime as this may be more mentally stimulating.  Melatonin 5-10mg  ineffective.  Encouraged avoiding alcohol at bedtime.  Will trial trazodone 25-50mg  nightly. He will call us in 1-2 wks with effect. If not helpful, would consider low dose benzo - reviewed risks of these meds including habit forming nature, dependence, tolerance potential.

## 2018-11-14 ENCOUNTER — Other Ambulatory Visit: Payer: Self-pay | Admitting: Urology

## 2018-11-14 ENCOUNTER — Ambulatory Visit (INDEPENDENT_AMBULATORY_CARE_PROVIDER_SITE_OTHER): Payer: Medicare Other | Admitting: Urology

## 2018-11-14 ENCOUNTER — Other Ambulatory Visit: Payer: Self-pay

## 2018-11-14 VITALS — BP 131/85 | HR 76 | Ht 69.5 in | Wt 209.0 lb

## 2018-11-14 DIAGNOSIS — R972 Elevated prostate specific antigen [PSA]: Secondary | ICD-10-CM

## 2018-11-14 DIAGNOSIS — Z87898 Personal history of other specified conditions: Secondary | ICD-10-CM | POA: Diagnosis not present

## 2018-11-14 DIAGNOSIS — C61 Malignant neoplasm of prostate: Secondary | ICD-10-CM | POA: Diagnosis not present

## 2018-11-14 MED ORDER — LEVOFLOXACIN 500 MG PO TABS
500.0000 mg | ORAL_TABLET | Freq: Once | ORAL | Status: AC
  Administered 2018-11-14: 500 mg via ORAL

## 2018-11-14 MED ORDER — GENTAMICIN SULFATE 40 MG/ML IJ SOLN
80.0000 mg | Freq: Once | INTRAMUSCULAR | Status: AC
  Administered 2018-11-14: 80 mg via INTRAMUSCULAR

## 2018-11-14 NOTE — Progress Notes (Signed)
Prostate Biopsy Procedure   Indications: Elevated PSA on TRT.  Unable to have MRI secondary to pacemaker/defibrillator  Informed consent was obtained after discussing risks/benefits of the procedure.  A time out was performed to ensure correct patient identity.  Pre-Procedure: - Last PSA Level:  Lab Results  Component Value Date   PSA 4.35 (H) 09/13/2018   PSA 5.60 (H) 12/21/2017   PSA 4.90 (H) 05/10/2017   - Gentamicin given prophylactically - Levaquin 500 mg administered PO -Transrectal Ultrasound performed revealing a 27 gm prostate -No significant hypoechoic or median lobe noted  Procedure: - Prostate block performed using 10 cc 1% lidocaine and biopsies taken from sextant areas, a total of 12 under ultrasound guidance.  Post-Procedure: - Patient tolerated the procedure well - He was counseled to seek immediate medical attention if experiences any severe pain, significant bleeding, or fevers - Return in one week to discuss biopsy results   John Giovanni, MD

## 2018-11-15 ENCOUNTER — Telehealth: Payer: Self-pay | Admitting: Family Medicine

## 2018-11-15 ENCOUNTER — Encounter: Payer: Self-pay | Admitting: Family Medicine

## 2018-11-15 ENCOUNTER — Other Ambulatory Visit: Payer: Self-pay | Admitting: Family Medicine

## 2018-11-15 ENCOUNTER — Telehealth: Payer: Self-pay

## 2018-11-15 ENCOUNTER — Ambulatory Visit (INDEPENDENT_AMBULATORY_CARE_PROVIDER_SITE_OTHER): Payer: Medicare Other | Admitting: Family Medicine

## 2018-11-15 DIAGNOSIS — M25532 Pain in left wrist: Secondary | ICD-10-CM

## 2018-11-15 DIAGNOSIS — I255 Ischemic cardiomyopathy: Secondary | ICD-10-CM

## 2018-11-15 LAB — CBC WITH DIFFERENTIAL/PLATELET
Basophils Absolute: 0 10*3/uL (ref 0.0–0.1)
Basophils Relative: 0.5 % (ref 0.0–3.0)
Eosinophils Absolute: 0.1 10*3/uL (ref 0.0–0.7)
Eosinophils Relative: 6.5 % — ABNORMAL HIGH (ref 0.0–5.0)
HCT: 40.5 % (ref 39.0–52.0)
Hemoglobin: 13.7 g/dL (ref 13.0–17.0)
Lymphocytes Relative: 25.3 % (ref 12.0–46.0)
Lymphs Abs: 0.4 10*3/uL — ABNORMAL LOW (ref 0.7–4.0)
MCHC: 33.8 g/dL (ref 30.0–36.0)
MCV: 95.3 fl (ref 78.0–100.0)
Monocytes Absolute: 0.1 10*3/uL (ref 0.1–1.0)
Monocytes Relative: 8.4 % (ref 3.0–12.0)
Neutro Abs: 1 10*3/uL — ABNORMAL LOW (ref 1.4–7.7)
Neutrophils Relative %: 59.3 % (ref 43.0–77.0)
Platelets: 124 10*3/uL — ABNORMAL LOW (ref 150.0–400.0)
RBC: 4.25 Mil/uL (ref 4.22–5.81)
RDW: 15.7 % — ABNORMAL HIGH (ref 11.5–15.5)
WBC: 1.7 10*3/uL — CL (ref 4.0–10.5)

## 2018-11-15 LAB — URIC ACID: Uric Acid, Serum: 7 mg/dL (ref 4.0–7.8)

## 2018-11-15 MED ORDER — CEPHALEXIN 500 MG PO CAPS: 500.0000 mg | ORAL_CAPSULE | Freq: Three times a day (TID) | ORAL | 0 refills | Status: DC

## 2018-11-15 MED ORDER — PREDNISONE 20 MG PO TABS: ORAL_TABLET | ORAL | 0 refills | Status: DC

## 2018-11-15 NOTE — Patient Instructions (Signed)
Please stop at the lab to have labs drawn.  Start prednisone ASAP.  We will call by day end for possible addition of antibiotics. ICe, elevate wrist.  Call if redness spreading or fever.

## 2018-11-15 NOTE — Telephone Encounter (Signed)
Patient was in today to see Dr Diona Browner in regards to his hand  He is needing a call back to clarify instructions on his prednisone  C/B #  [phone]

## 2018-11-15 NOTE — Telephone Encounter (Signed)
[facility] called critical results @ 1453 WBC 1.7

## 2018-11-15 NOTE — Telephone Encounter (Signed)
Spoke with Mr. Bulkley.  He states he pharmacy typed the directions backwards. Correct dosing instructions discussed with patient.  Mr. Trella states understanding.

## 2018-11-15 NOTE — Assessment & Plan Note (Signed)
No clear enry of cellultiis/ infeciton.  More likely gout flare given severity and suddeness of pain occurring after large amount of seafood.  Will check uric acid and cbc ( to eval wbc for possible infection) Start prednisone taper. Review labs and call pt day end to determine if additional treatment of antibiotics needed.   No know joint injury.

## 2018-11-15 NOTE — Telephone Encounter (Signed)
Similar to wbc at 2.1  1 month ago. Also low lymphocytes, neutrophils and platelets. Reviewed with his oncologist Dr. Burr Medico. He is not on any active immunosuppressive therapy currently. He had only slight  Ill feeling but mainly left wrist issue as seen for earlier today.  Most likly from inflammation or virus.  Dr. Burr Medico will contact pt in 6 weeks for re-eval cbc  and he should notify someone if any fever or flulike illness.  Patient notified and spoke with Silas [place] his daughter with patient's permission.  He reports wrist and hand are 25% better after first dose of prednisone for treating possible gout ( uric acid 7). No fever, denies flu like symptoms now. No spread of redness past mark area. Will treat empirically for bacteria cellulitis with keflex x 7 days as well.  Will have pt follow up next week for re-eval.  He voiced understanding with plan.  Robin : please call pt to set up follow up mid to end of next week with myself or Dr. Darnell Level... I forgot to do this at appoinment CC: PCP

## 2018-11-15 NOTE — Progress Notes (Addendum)
Chief Complaint  Patient presents with  . Wrist Pain    Left    History of Present Illness: HPI   78 year old male presents with new onset left wrist and hand pain.   He reports  5-6 days ago... awoke with mild pain.  Started tylenol.. helped some.  Now pain is worsening. Swelling and redness in left wrist and hand.  Trouble sleeping with pain. Pain 10/10 on pain scale. No  Falls, no injuries.  No bite.   No fever, feels slightly flu like.  History of CVA and hemiplegia, metastatic melanoma  Did have large amount of seafood 4-5 days ago.  No history of gout. History of CKD.. cannot take Advil.  COVID 19 screen No recent travel or known exposure to COVID19 The patient denies respiratory symptoms of COVID 19 at this time.  The importance of social distancing was discussed today.   Review of Systems  Constitutional: Negative for chills and fever.  HENT: Negative for congestion and ear pain.   Eyes: Negative for pain and redness.  Respiratory: Negative for cough and shortness of breath.   Cardiovascular: Negative for chest pain, palpitations and leg swelling.  Gastrointestinal: Negative for abdominal pain, blood in stool, constipation, diarrhea, nausea and vomiting.  Genitourinary: Negative for dysuria.  Musculoskeletal: Negative for falls and myalgias.  Skin: Negative for rash.  Neurological: Negative for dizziness.  Psychiatric/Behavioral: Negative for depression. The patient is not nervous/anxious.       Past Medical History:  Diagnosis Date  . Anginal pain ([place])   . Arthritis    mostly hands  . Benign familial hematuria   . BPH (benign prostatic hypertrophy)   . Brain cancer ([place])    melanoma with brain met  . Coronary artery disease   . GERD (gastroesophageal reflux disease)   . High cholesterol   . Hypertension   . Hypothyroidism   . Intracerebral hemorrhage ([place])   . Melanoma ([place])   . Metastatic melanoma to lung ([place])   . Mood disorder ([place])   .  Myocardial infarction ([place])   . Pacemaker    due to syncope, 3rd degree HB (upgrade to [facility]. Jude CRT-P 02/25/13 (Dr. Uvaldo Rising)  . Rectal bleed    due to NSAIDS  . Renal disorder   . Skin cancer    melanoma  . Sleep apnea    uses CPAP  . Stroke [facility]     reports that he quit smoking about 61 years ago. He has a 3.00 pack-year smoking history. He has never used smokeless tobacco. He reports current alcohol use of about 14.0 standard drinks of alcohol per week. He reports that he does not use drugs.   Current Outpatient Medications:  .  acetaminophen (TYLENOL) 500 MG tablet, Take 500 mg by mouth at bedtime., Disp: , Rfl:  .  amLODipine (NORVASC) 5 MG tablet, TAKE ONE TABLET BY MOUTH EVERY DAY, Disp: 32 tablet, Rfl: 11 .  aspirin EC 81 MG tablet, Take 81 mg by mouth daily., Disp: , Rfl:  .  Cholecalciferol (VITAMIN D3) 1000 units CAPS, Take 1 capsule (1,000 Units total) by mouth daily., Disp: 30 capsule, Rfl:  .  citalopram (CELEXA) 10 MG tablet, Take 1 tablet (10 mg total) by mouth daily., Disp: 90 tablet, Rfl: 1 .  fluocinonide cream (LIDEX) 0.05 %, Apply topically 2 (two) times daily as needed., Disp: 30 g, Rfl: 0 .  ketoconazole (NIZORAL) 2 % shampoo, Apply 1 application topically 2 (two) times a week.,  Disp: 120 mL, Rfl: 1 .  levothyroxine (SYNTHROID, LEVOTHROID) 137 MCG tablet, TAKE 1 TABLET BY MOUTH ONCE DAILY BEFORE BREAKFAST, Disp: 30 tablet, Rfl: 11 .  methylphenidate (RITALIN) 10 MG tablet, Take 1 tablet (10 mg total) by mouth daily., Disp: 30 tablet, Rfl: 0 .  metoprolol succinate (TOPROL-XL) 50 MG 24 hr tablet, TAKE 1 AND 1/2 TABLET (75 MG) BY MOUTH EVERY DAY WITH OR IMMEDIATELY FOLLOWING A MEAL *DO NOT CRUSH*, Disp: 50 tablet, Rfl: 11 .  pentoxifylline (TRENTAL) 400 MG CR tablet, Take 1 tablet (400 mg total) by mouth 2 (two) times daily., Disp: 60 tablet, Rfl: 6 .  rosuvastatin (CRESTOR) 20 MG tablet, TAKE ONE TABLET BY MOUTH AT BEDTIME, Disp: 30 tablet, Rfl: 8 .  testosterone  cypionate (DEPOTESTOTERONE CYPIONATE) 100 MG/ML injection, Inject 1 mL (100 mg total) into the muscle every 14 (fourteen) days., Disp: 10 mL, Rfl: 0 .  traZODone (DESYREL) 50 MG tablet, Take 0.5-1 tablets (25-50 mg total) by mouth at bedtime., Disp: 30 tablet, Rfl: 1 .  Turmeric 500 MG CAPS, Take 2 capsules by mouth daily., Disp: , Rfl:  .  vitamin E (VITAMIN E) 400 UNIT capsule, Take 1 capsule (400 Units total) by mouth 2 (two) times daily., Disp: 60 capsule, Rfl: 6 No current facility-administered medications for this visit.   Facility-Administered Medications Ordered in Other Visits:  .  0.9 %  sodium chloride infusion, 1,000 mL, Intravenous, Once, [name], MD   Observations/Objective: Blood pressure 112/78, pulse 75, temperature 98.9 F (37.2 C), temperature source Temporal, height 5' 9.5" (1.765 m), weight 207 lb (93.9 kg), SpO2 96 %.  Physical Exam Constitutional:      Appearance: He is well-developed.  HENT:     Head: Normocephalic.     Right Ear: Hearing normal.     Left Ear: Hearing normal.     Nose: Nose normal.  Neck:     Thyroid: No thyroid mass or thyromegaly.     Vascular: No carotid bruit.     Trachea: Trachea normal.  Cardiovascular:     Rate and Rhythm: Normal rate and regular rhythm.     Pulses: Normal pulses.     Heart sounds: Heart sounds not distant. No murmur. No friction rub. No gallop.      Comments: No peripheral edema Pulmonary:     Effort: Pulmonary effort is normal. No respiratory distress.     Breath sounds: Normal breath sounds.  Musculoskeletal:       Arms:     Comments: Mild erythem, significant swellking and decreased ROM .Marland Kitchen crentered on left MCP joint and left wrist... swelling is doiffuse in hand.  Skin:    General: Skin is warm and dry.     Findings: No rash.  Psychiatric:        Speech: Speech normal.        Behavior: Behavior normal.        Thought Content: Thought content normal.    Wrist marked with permant morker  Assessment  and Plan   Acute pain of left wrist No clear enry of cellultiis/ infeciton.  More likely gout flare given severity and suddeness of pain occurring after large amount of seafood.  Will check uric acid and cbc ( to eval wbc for possible infection) Start prednisone taper. Review labs and call pt day end to determine if additional treatment of antibiotics needed.   No know joint injury.    Spoke with daughter about symptoms and treatment plan  Dwyane Dupree, MD   

## 2018-11-18 ENCOUNTER — Telehealth: Payer: Self-pay | Admitting: Hematology

## 2018-11-18 NOTE — Telephone Encounter (Signed)
Scheduled appt per 8/07 sch message- pt aware of appt date and time

## 2018-11-18 NOTE — Telephone Encounter (Signed)
Appointment  8/13 pt aware

## 2018-11-21 ENCOUNTER — Ambulatory Visit: Payer: Medicare Other | Admitting: Family Medicine

## 2018-11-21 DIAGNOSIS — Z0289 Encounter for other administrative examinations: Secondary | ICD-10-CM

## 2018-11-21 LAB — ANATOMIC PATHOLOGY REPORT: PDF Image: 0

## 2018-11-21 NOTE — Telephone Encounter (Addendum)
Pt missed appt today. [name] plz call for update on wrist pain - was treated for possible gout vs infection with prednisone and keflex course.

## 2018-11-21 NOTE — Telephone Encounter (Signed)
Spoke with pt asking about his left wrist pain.  He first apologized for missing today's appt.  States pain is a lot better.  He thinks it may have had something to do with a large amount of shrimp he had.  Says seafood has not usually bothered him.  But he remembers years ago his mother being allergic to seafood.

## 2018-11-28 ENCOUNTER — Ambulatory Visit (INDEPENDENT_AMBULATORY_CARE_PROVIDER_SITE_OTHER): Payer: Medicare Other | Admitting: Urology

## 2018-11-28 ENCOUNTER — Encounter: Payer: Self-pay | Admitting: Urology

## 2018-11-28 ENCOUNTER — Other Ambulatory Visit: Payer: Self-pay

## 2018-11-28 VITALS — BP 117/79 | HR 77 | Ht 69.0 in | Wt 205.0 lb

## 2018-11-28 DIAGNOSIS — C61 Malignant neoplasm of prostate: Secondary | ICD-10-CM | POA: Diagnosis not present

## 2018-11-28 NOTE — Progress Notes (Signed)
11/28/2018 1:56 PM   David [name]. January 06, 1941 [id number]  Referring provider: [name], MD [address] [place],  [place] [address]  Chief Complaint  Patient presents with  . BX results    HPI: 84-year y.o. male presents for prostate biopsy follow-up.  Biopsy was performed on 8/6 for an elevated PSA on TRT.  He had no post biopsy complaints.  He was unable to have an MRI secondary to pacemaker/defibrillator.  He had intermittent hematuria for 5-7 days after the biopsy and has no complaints today.  Prostate volume was 27 g.  Standard 12 core biopsies were performed.  Pathology: 8/12 cores positive for adenocarcinoma the prostate.  Bilateral disease present with 4 cores showing Gleason 3+4 adenocarcinoma involving  25-95% of submitted tissue.  The remaining 4 cores were positive for Gleason 3+3 adenocarcinoma.     PMH: Past Medical History:  Diagnosis Date  . Anginal pain ([place])   . Arthritis    mostly hands  . Benign familial hematuria   . BPH (benign prostatic hypertrophy)   . Brain cancer ([place])    melanoma with brain met  . Coronary artery disease   . GERD (gastroesophageal reflux disease)   . High cholesterol   . Hypertension   . Hypothyroidism   . Intracerebral hemorrhage ([place])   . Melanoma ([place])   . Metastatic melanoma to lung ([place])   . Mood disorder ([place])   . Myocardial infarction ([place])   . Pacemaker    due to syncope, 3rd degree HB (upgrade to [facility]. Jude CRT-P 02/25/13 (Dr. Uvaldo Rising)  . Rectal bleed    due to NSAIDS  . Renal disorder   . Skin cancer    melanoma  . Sleep apnea    uses CPAP  . Stroke [facility])     Surgical History: Past Surgical History:  Procedure Laterality Date  . APPLICATION OF CRANIAL NAVIGATION N/A 10/15/2015   Procedure: APPLICATION OF CRANIAL NAVIGATION;  Surgeon: Erline Levine, MD;  Location: [facility];  Service: Neurosurgery;  Laterality: N/A;  . CARDIAC CATHETERIZATION  2013  . CARDIAC SURGERY     bypass  X 2  . COLONOSCOPY WITH PROPOFOL N/A 05/08/2016   diverticulosis, int hem, no f/u needed [name])  . CORONARY ARTERY BYPASS GRAFT  2013   LIMA-LAD, SVG-PDA 03/27/11 (Dr. Francee Gentile)  . CRANIOTOMY N/A 10/15/2015   Procedure: LEFT FRONTAL CRANIOTOMY TUMOR EXCISION with Curve;  Surgeon: Erline Levine, MD;  Location: [facility];  Service: Neurosurgery;  Laterality: N/A;  CRANIOTOMY TUMOR EXCISION  . EYE SURGERY    . FOOT SURGERY  08/2010  . JOINT REPLACEMENT Left    partial knee  . KNEE ARTHROSCOPY  12/2008  . LYMPH NODE BIOPSY    . PACEMAKER INSERTION    . TONSILLECTOMY      Home Medications:  Allergies as of 11/28/2018      Reactions   Ezetimibe    Simvastatin Other (See Comments)   Reaction:  Gave pt a fever  Fever - temp of 103.   In 2003      Medication List       Accurate as of November 28, 2018  1:56 PM. If you have any questions, ask your nurse or doctor.        acetaminophen 500 MG tablet Commonly known as: TYLENOL Take 500 mg by mouth at bedtime.   amLODipine 5 MG tablet Commonly known as: NORVASC TAKE ONE TABLET BY MOUTH EVERY DAY  aspirin EC 81 MG tablet Take 81 mg by mouth daily.   cephALEXin 500 MG capsule Commonly known as: KEFLEX Take 1 capsule (500 mg total) by mouth 3 (three) times daily.   citalopram 10 MG tablet Commonly known as: CELEXA Take 1 tablet (10 mg total) by mouth daily.   fluocinonide cream 0.05 % Commonly known as: LIDEX Apply topically 2 (two) times daily as needed.   ketoconazole 2 % shampoo Commonly known as: Nizoral Apply 1 application topically 2 (two) times a week.   levothyroxine 137 MCG tablet Commonly known as: SYNTHROID TAKE 1 TABLET BY MOUTH ONCE DAILY BEFORE BREAKFAST   methylphenidate 10 MG tablet Commonly known as: RITALIN Take 1 tablet (10 mg total) by mouth daily.   metoprolol succinate 50 MG 24 hr tablet Commonly known as: TOPROL-XL TAKE 1 AND 1/2 TABLET (75 MG) BY MOUTH EVERY DAY WITH OR IMMEDIATELY FOLLOWING A  MEAL *DO NOT CRUSH*   pentoxifylline 400 MG CR tablet Commonly known as: TRENTAL Take 1 tablet (400 mg total) by mouth 2 (two) times daily.   predniSONE 20 MG tablet Commonly known as: DELTASONE 3 tabs by mouth daily x 3 days, then 2 tabs by mouth daily x 2 days then 1 tab by mouth daily x 2 days   rosuvastatin 20 MG tablet Commonly known as: CRESTOR TAKE ONE TABLET BY MOUTH AT BEDTIME   testosterone cypionate 100 MG/ML injection Commonly known as: DEPOTESTOTERONE CYPIONATE Inject 1 mL (100 mg total) into the muscle every 14 (fourteen) days. What changed: Another medication with the same name was removed. Continue taking this medication, and follow the directions you see here. Changed by:  C , MD   traZODone 50 MG tablet Commonly known as: DESYREL Take 0.5-1 tablets (25-50 mg total) by mouth at bedtime.   Turmeric 500 MG Caps Take 2 capsules by mouth daily.   Vitamin D3 25 MCG (1000 UT) Caps Take 1 capsule (1,000 Units total) by mouth daily.   vitamin E 400 UNIT capsule Commonly known as: vitamin E Take 1 capsule (400 Units total) by mouth 2 (two) times daily.       Allergies:  Allergies  Allergen Reactions  . Ezetimibe   . Simvastatin Other (See Comments)    Reaction:  Gave pt a fever  Fever - temp of 103.   In 2003    Family History: Family History  Problem Relation Age of Onset  . Cancer Mother 78       lung   . Hypertension Mother   . Hypertension Father   . Heart attack Father   . Heart disease Father   . Rheum arthritis Sister   . Cancer Maternal Grandmother     Social History:  reports that he quit smoking about 61 years ago. He has a 3.00 pack-year smoking history. He has never used smokeless tobacco. He reports current alcohol use of about 14.0 standard drinks of alcohol per week. He reports that he does not use drugs.  ROS: UROLOGY Frequent Urination?: No Hard to postpone urination?: No Burning/pain with urination?: No Get up at  night to urinate?: No Leakage of urine?: No Urine stream starts and stops?: No Trouble starting stream?: No Do you have to strain to urinate?: Yes Blood in urine?: No Urinary tract infection?: No Sexually transmitted disease?: No Injury to kidneys or bladder?: No Painful intercourse?: No Weak stream?: No Erection problems?: No Penile pain?: No  Gastrointestinal Nausea?: No Vomiting?: No Indigestion/heartburn?: No Diarrhea?: No Constipation?:   No  Constitutional Fever: No Night sweats?: No Weight loss?: No Fatigue?: No  Skin Skin rash/lesions?: No Itching?: No  Eyes Blurred vision?: No Double vision?: No  Ears/Nose/Throat Sore throat?: No Sinus problems?: No  Hematologic/Lymphatic Swollen glands?: No Easy bruising?: No  Cardiovascular Leg swelling?: No Chest pain?: No  Respiratory Cough?: No Shortness of breath?: No  Endocrine Excessive thirst?: No  Musculoskeletal Back pain?: No Joint pain?: No  Neurological Headaches?: No Dizziness?: No  Psychologic Depression?: No Anxiety?: No  Physical Exam: BP 117/79 (BP Location: Left Arm, Patient Position: Sitting)   Pulse 77   Ht 5' 9" (1.753 m)   Wt 205 lb (93 kg)   BMI 30.27 kg/m   Constitutional:  Alert, No acute distress.   Assessment & Plan:    - cT1c adenocarcinoma prostate; intermediate unfavorable The pathology report was discussed with Mr. Freehling.  I did recommend a CT of the abdomen pelvis to evaluate for extraprostatic disease.  His life expectancy is <10 years based on age and comorbidities.  I briefly discussed radiation therapy.  Hormonal therapy was also discussed as well as monitoring/surveillance.  He is on TRT and does not want to discontinue.  We did discuss that exogenous testosterone is contraindicated with a diagnosis of prostate cancer.  His daughter is a GYN in [place] and he stated he would have her call me to discuss diagnosis and management options.  Greater than  50% of this 15-minute visit was spent counseling the patient.    C , MD  [facility] [address], [address] [place], [place] [address] [phone]  

## 2018-12-02 ENCOUNTER — Telehealth: Payer: Self-pay | Admitting: Medical Oncology

## 2018-12-02 NOTE — Telephone Encounter (Signed)
Spoke with David [name] to introduce myself as the prostate nurse navigator and my role. He was recently diagnosed with prostate cancer in [place] and would like to discuss treatment options. He is concerned about stopping testosterone. We have [place] 8/25 but I am not able to get pathology slides here for review. Dr. Marda Stalker ordered a CT which has not been scheduled yet. Patient would like to be seen in the 9/11 clinic after we are able to obtain slides and  CT is resulted.. I discussed the format of the clinic and the physicians he will consult with. I explained that we are doing consults by telephone or WebEx. I will mail him a packet of information about the clinic with medical forms. I asked him to complete and they will be discussed during the consult. I gave him my office number and asked him to call me with questions or concerns. He voiced understanding. I notified his daughter, Dr. [name] of the above.

## 2018-12-04 ENCOUNTER — Ambulatory Visit (INDEPENDENT_AMBULATORY_CARE_PROVIDER_SITE_OTHER): Payer: Medicare Other | Admitting: *Deleted

## 2018-12-04 ENCOUNTER — Ambulatory Visit: Payer: Medicare Other

## 2018-12-04 DIAGNOSIS — I255 Ischemic cardiomyopathy: Secondary | ICD-10-CM | POA: Diagnosis not present

## 2018-12-04 LAB — CUP PACEART REMOTE DEVICE CHECK
Battery Remaining Longevity: 6 mo
Battery Remaining Percentage: 9 %
Battery Voltage: 2.71 V
Brady Statistic AP VP Percent: 99 %
Brady Statistic AP VS Percent: 1 %
Brady Statistic AS VP Percent: 1 %
Brady Statistic AS VS Percent: 1 %
Brady Statistic RA Percent Paced: 99 %
Date Time Interrogation Session: 20200825060013
Implantable Lead Implant Date: 19980107
Implantable Lead Implant Date: 19980107
Implantable Lead Implant Date: 20141118
Implantable Lead Location: 753858
Implantable Lead Location: 753859
Implantable Lead Location: 753860
Implantable Pulse Generator Implant Date: 20141118
Lead Channel Impedance Value: 1025 Ohm
Lead Channel Impedance Value: 360 Ohm
Lead Channel Impedance Value: 430 Ohm
Lead Channel Pacing Threshold Amplitude: 1.125 V
Lead Channel Pacing Threshold Amplitude: 1.25 V
Lead Channel Pacing Threshold Amplitude: 1.25 V
Lead Channel Pacing Threshold Pulse Width: 0.5 ms
Lead Channel Pacing Threshold Pulse Width: 0.8 ms
Lead Channel Pacing Threshold Pulse Width: 1 ms
Lead Channel Sensing Intrinsic Amplitude: 10.9 mV
Lead Channel Sensing Intrinsic Amplitude: 2.7 mV
Lead Channel Setting Pacing Amplitude: 2.125
Lead Channel Setting Pacing Amplitude: 2.5 V
Lead Channel Setting Pacing Amplitude: 2.5 V
Lead Channel Setting Pacing Pulse Width: 0.5 ms
Lead Channel Setting Pacing Pulse Width: 1 ms
Lead Channel Setting Sensing Sensitivity: 4 mV
Pulse Gen Model: 3242
Pulse Gen Serial Number: 7523334

## 2018-12-05 ENCOUNTER — Telehealth: Payer: Self-pay | Admitting: Medical Oncology

## 2018-12-05 NOTE — Telephone Encounter (Signed)
  Called [facility] to reschedule CT scan from 8/26. Appointment given for 9/3 arriving at 1:15 pm for 1:30 pm scan. I spoke with Mr. Szczesniak and confirmed new appointment. I sent an in box message with the new appointment to his daughter, Dr. [name].

## 2018-12-05 NOTE — Telephone Encounter (Signed)
Spoke with David [name] regarding missed CT 8/26. He states he was not aware of appointment. He asked if I gave this appointment to his wife. I informed him that Dr. Dene Gentry office made the appointment. He shared, his wife has memory issues and always ask for him. I will get this appointment rescheduled and call him back with new date and time. He voiced understanding.

## 2018-12-08 ENCOUNTER — Inpatient Hospital Stay (HOSPITAL_COMMUNITY)
Admission: EM | Admit: 2018-12-08 | Discharge: 2018-12-12 | DRG: 871 | Disposition: A | Discharge status: Home Health | Payer: Medicare Other | Attending: Internal Medicine | Admitting: Internal Medicine

## 2018-12-08 ENCOUNTER — Emergency Department (HOSPITAL_COMMUNITY): Payer: Medicare Other

## 2018-12-08 ENCOUNTER — Other Ambulatory Visit: Payer: Self-pay

## 2018-12-08 ENCOUNTER — Encounter (HOSPITAL_COMMUNITY): Payer: Self-pay

## 2018-12-08 DIAGNOSIS — D61818 Other pancytopenia: Secondary | ICD-10-CM | POA: Diagnosis present

## 2018-12-08 DIAGNOSIS — I252 Old myocardial infarction: Secondary | ICD-10-CM

## 2018-12-08 DIAGNOSIS — Z950 Presence of cardiac pacemaker: Secondary | ICD-10-CM | POA: Diagnosis present

## 2018-12-08 DIAGNOSIS — T451X5A Adverse effect of antineoplastic and immunosuppressive drugs, initial encounter: Secondary | ICD-10-CM

## 2018-12-08 DIAGNOSIS — Z96652 Presence of left artificial knee joint: Secondary | ICD-10-CM | POA: Diagnosis present

## 2018-12-08 DIAGNOSIS — G9341 Metabolic encephalopathy: Secondary | ICD-10-CM | POA: Diagnosis not present

## 2018-12-08 DIAGNOSIS — I517 Cardiomegaly: Secondary | ICD-10-CM | POA: Diagnosis present

## 2018-12-08 DIAGNOSIS — R972 Elevated prostate specific antigen [PSA]: Secondary | ICD-10-CM | POA: Diagnosis present

## 2018-12-08 DIAGNOSIS — Z79899 Other long term (current) drug therapy: Secondary | ICD-10-CM

## 2018-12-08 DIAGNOSIS — N183 Chronic kidney disease, stage 3 unspecified: Secondary | ICD-10-CM | POA: Diagnosis present

## 2018-12-08 DIAGNOSIS — I69151 Hemiplegia and hemiparesis following nontraumatic intracerebral hemorrhage affecting right dominant side: Secondary | ICD-10-CM

## 2018-12-08 DIAGNOSIS — E291 Testicular hypofunction: Secondary | ICD-10-CM | POA: Diagnosis present

## 2018-12-08 DIAGNOSIS — E663 Overweight: Secondary | ICD-10-CM | POA: Diagnosis present

## 2018-12-08 DIAGNOSIS — C7951 Secondary malignant neoplasm of bone: Secondary | ICD-10-CM | POA: Diagnosis not present

## 2018-12-08 DIAGNOSIS — R52 Pain, unspecified: Secondary | ICD-10-CM

## 2018-12-08 DIAGNOSIS — D709 Neutropenia, unspecified: Secondary | ICD-10-CM

## 2018-12-08 DIAGNOSIS — Z809 Family history of malignant neoplasm, unspecified: Secondary | ICD-10-CM

## 2018-12-08 DIAGNOSIS — R5081 Fever presenting with conditions classified elsewhere: Secondary | ICD-10-CM | POA: Diagnosis present

## 2018-12-08 DIAGNOSIS — M7989 Other specified soft tissue disorders: Secondary | ICD-10-CM | POA: Diagnosis not present

## 2018-12-08 DIAGNOSIS — N179 Acute kidney failure, unspecified: Secondary | ICD-10-CM

## 2018-12-08 DIAGNOSIS — N400 Enlarged prostate without lower urinary tract symptoms: Secondary | ICD-10-CM | POA: Diagnosis present

## 2018-12-08 DIAGNOSIS — E785 Hyperlipidemia, unspecified: Secondary | ICD-10-CM | POA: Diagnosis present

## 2018-12-08 DIAGNOSIS — Z9989 Dependence on other enabling machines and devices: Secondary | ICD-10-CM

## 2018-12-08 DIAGNOSIS — R509 Fever, unspecified: Secondary | ICD-10-CM | POA: Diagnosis not present

## 2018-12-08 DIAGNOSIS — R6520 Severe sepsis without septic shock: Secondary | ICD-10-CM

## 2018-12-08 DIAGNOSIS — E039 Hypothyroidism, unspecified: Secondary | ICD-10-CM | POA: Diagnosis present

## 2018-12-08 DIAGNOSIS — L03115 Cellulitis of right lower limb: Secondary | ICD-10-CM | POA: Diagnosis present

## 2018-12-08 DIAGNOSIS — Z7982 Long term (current) use of aspirin: Secondary | ICD-10-CM

## 2018-12-08 DIAGNOSIS — R7303 Prediabetes: Secondary | ICD-10-CM | POA: Diagnosis present

## 2018-12-08 DIAGNOSIS — A419 Sepsis, unspecified organism: Principal | ICD-10-CM

## 2018-12-08 DIAGNOSIS — L539 Erythematous condition, unspecified: Secondary | ICD-10-CM | POA: Diagnosis not present

## 2018-12-08 DIAGNOSIS — Z793 Long term (current) use of hormonal contraceptives: Secondary | ICD-10-CM

## 2018-12-08 DIAGNOSIS — N170 Acute kidney failure with tubular necrosis: Secondary | ICD-10-CM | POA: Diagnosis not present

## 2018-12-08 DIAGNOSIS — D696 Thrombocytopenia, unspecified: Secondary | ICD-10-CM

## 2018-12-08 DIAGNOSIS — G4700 Insomnia, unspecified: Secondary | ICD-10-CM | POA: Diagnosis present

## 2018-12-08 DIAGNOSIS — E669 Obesity, unspecified: Secondary | ICD-10-CM | POA: Diagnosis present

## 2018-12-08 DIAGNOSIS — N261 Atrophy of kidney (terminal): Secondary | ICD-10-CM | POA: Diagnosis not present

## 2018-12-08 DIAGNOSIS — K219 Gastro-esophageal reflux disease without esophagitis: Secondary | ICD-10-CM | POA: Diagnosis present

## 2018-12-08 DIAGNOSIS — I248 Other forms of acute ischemic heart disease: Secondary | ICD-10-CM | POA: Diagnosis present

## 2018-12-08 DIAGNOSIS — G9340 Encephalopathy, unspecified: Secondary | ICD-10-CM | POA: Diagnosis not present

## 2018-12-08 DIAGNOSIS — F329 Major depressive disorder, single episode, unspecified: Secondary | ICD-10-CM | POA: Diagnosis present

## 2018-12-08 DIAGNOSIS — I25118 Atherosclerotic heart disease of native coronary artery with other forms of angina pectoris: Secondary | ICD-10-CM | POA: Diagnosis present

## 2018-12-08 DIAGNOSIS — I69120 Aphasia following nontraumatic intracerebral hemorrhage: Secondary | ICD-10-CM

## 2018-12-08 DIAGNOSIS — M109 Gout, unspecified: Secondary | ICD-10-CM | POA: Diagnosis present

## 2018-12-08 DIAGNOSIS — C61 Malignant neoplasm of prostate: Secondary | ICD-10-CM | POA: Diagnosis present

## 2018-12-08 DIAGNOSIS — Z923 Personal history of irradiation: Secondary | ICD-10-CM

## 2018-12-08 DIAGNOSIS — Z79890 Hormone replacement therapy (postmenopausal): Secondary | ICD-10-CM

## 2018-12-08 DIAGNOSIS — I1310 Hypertensive heart and chronic kidney disease without heart failure, with stage 1 through stage 4 chronic kidney disease, or unspecified chronic kidney disease: Secondary | ICD-10-CM | POA: Diagnosis present

## 2018-12-08 DIAGNOSIS — N390 Urinary tract infection, site not specified: Secondary | ICD-10-CM | POA: Diagnosis present

## 2018-12-08 DIAGNOSIS — I719 Aortic aneurysm of unspecified site, without rupture: Secondary | ICD-10-CM | POA: Diagnosis present

## 2018-12-08 DIAGNOSIS — R4182 Altered mental status, unspecified: Secondary | ICD-10-CM | POA: Diagnosis present

## 2018-12-08 DIAGNOSIS — Z951 Presence of aortocoronary bypass graft: Secondary | ICD-10-CM

## 2018-12-08 DIAGNOSIS — C7800 Secondary malignant neoplasm of unspecified lung: Secondary | ICD-10-CM

## 2018-12-08 DIAGNOSIS — G9389 Other specified disorders of brain: Secondary | ICD-10-CM | POA: Diagnosis not present

## 2018-12-08 DIAGNOSIS — N029 Recurrent and persistent hematuria with unspecified morphologic changes: Secondary | ICD-10-CM | POA: Diagnosis present

## 2018-12-08 DIAGNOSIS — Z95810 Presence of automatic (implantable) cardiac defibrillator: Secondary | ICD-10-CM | POA: Diagnosis present

## 2018-12-08 DIAGNOSIS — Z20828 Contact with and (suspected) exposure to other viral communicable diseases: Secondary | ICD-10-CM | POA: Diagnosis present

## 2018-12-08 DIAGNOSIS — R918 Other nonspecific abnormal finding of lung field: Secondary | ICD-10-CM | POA: Diagnosis not present

## 2018-12-08 DIAGNOSIS — C7931 Secondary malignant neoplasm of brain: Secondary | ICD-10-CM

## 2018-12-08 DIAGNOSIS — Z87891 Personal history of nicotine dependence: Secondary | ICD-10-CM

## 2018-12-08 DIAGNOSIS — D61810 Antineoplastic chemotherapy induced pancytopenia: Secondary | ICD-10-CM

## 2018-12-08 DIAGNOSIS — Z85841 Personal history of malignant neoplasm of brain: Secondary | ICD-10-CM

## 2018-12-08 DIAGNOSIS — Z7289 Other problems related to lifestyle: Secondary | ICD-10-CM

## 2018-12-08 DIAGNOSIS — I255 Ischemic cardiomyopathy: Secondary | ICD-10-CM | POA: Diagnosis present

## 2018-12-08 DIAGNOSIS — I10 Essential (primary) hypertension: Secondary | ICD-10-CM | POA: Diagnosis present

## 2018-12-08 DIAGNOSIS — Z888 Allergy status to other drugs, medicaments and biological substances status: Secondary | ICD-10-CM

## 2018-12-08 DIAGNOSIS — Z8249 Family history of ischemic heart disease and other diseases of the circulatory system: Secondary | ICD-10-CM

## 2018-12-08 DIAGNOSIS — R7989 Other specified abnormal findings of blood chemistry: Secondary | ICD-10-CM | POA: Diagnosis present

## 2018-12-08 DIAGNOSIS — C439 Malignant melanoma of skin, unspecified: Secondary | ICD-10-CM

## 2018-12-08 DIAGNOSIS — K5730 Diverticulosis of large intestine without perforation or abscess without bleeding: Secondary | ICD-10-CM | POA: Diagnosis not present

## 2018-12-08 DIAGNOSIS — Z79891 Long term (current) use of opiate analgesic: Secondary | ICD-10-CM

## 2018-12-08 DIAGNOSIS — Z9221 Personal history of antineoplastic chemotherapy: Secondary | ICD-10-CM

## 2018-12-08 DIAGNOSIS — G4733 Obstructive sleep apnea (adult) (pediatric): Secondary | ICD-10-CM | POA: Diagnosis present

## 2018-12-08 MED ORDER — SODIUM CHLORIDE 0.9 % IV SOLN
2.0000 g | Freq: Once | INTRAVENOUS | Status: AC
  Administered 2018-12-09: 2 g via INTRAVENOUS
  Filled 2018-12-08: qty 2

## 2018-12-08 MED ORDER — ACETAMINOPHEN 325 MG PO TABS
650.0000 mg | ORAL_TABLET | Freq: Once | ORAL | Status: AC
  Administered 2018-12-08: 650 mg via ORAL
  Filled 2018-12-08: qty 2

## 2018-12-08 MED ORDER — VANCOMYCIN HCL IN DEXTROSE 1-5 GM/200ML-% IV SOLN
1000.0000 mg | Freq: Once | INTRAVENOUS | Status: AC
  Administered 2018-12-09: 1000 mg via INTRAVENOUS
  Filled 2018-12-08: qty 200

## 2018-12-08 MED ORDER — SODIUM CHLORIDE 0.9 % IV BOLUS
1000.0000 mL | Freq: Once | INTRAVENOUS | Status: AC
  Administered 2018-12-09: 1000 mL via INTRAVENOUS

## 2018-12-08 MED ORDER — METRONIDAZOLE IN NACL 5-0.79 MG/ML-% IV SOLN
500.0000 mg | Freq: Once | INTRAVENOUS | Status: AC
  Administered 2018-12-09: 500 mg via INTRAVENOUS
  Filled 2018-12-08: qty 100

## 2018-12-08 MED ORDER — SODIUM CHLORIDE 0.9 % IV BOLUS
1000.0000 mL | Freq: Once | INTRAVENOUS | Status: DC
  Administered 2018-12-09: 1000 mL via INTRAVENOUS

## 2018-12-08 NOTE — ED Triage Notes (Signed)
EMS reports pt had prostate biopsy 1-2 weeks ago. Pt has 102 fever, hx chf, pacer 147/80 85 hr

## 2018-12-08 NOTE — ED Provider Notes (Signed)
[facility] EMERGENCY DEPARTMENT Provider Note   CSN: [id number] Arrival date & time: 12/08/18  2305     History   Chief Complaint Chief Complaint  Patient presents with   Hematuria    HPI David [name]. is a 78 y.o. male.     Level 5 for altered mental status.  Patient with history of recently diagnosed prostate cancer, not receiving treatment, melanoma in remission, intracerebral hemorrhage, pacemaker, gout, CAD status post CABG, rectal bleeding presenting from home with altered mental status, dysuria and fever.  Daughter reports he had a prostate biopsy on August 6.  He has metastatic prostate cancer is not receiving any chemotherapy or radiation.  Over the past 2 days he has had decreased mental status, increased confusion, increased dysuria, frequency, urgency and fever up to 102.  He has pain with urination as well as blood in the urine and frequency and urgency.  He complains of pain across his lower abdomen and low back.  Denies any nausea vomiting.  Denies any falls or hitting his head.  Denies any chest pain or shortness of breath.  Did have a coronavirus exposure several weeks ago between the source patient tested negative.  His daughter is a OB/gynecologist and she has noticed a change in his mental status over the past 2 days to the point where is not able to walk today and generally weak.  He was able to walk on his own yesterday.  The history is provided by the patient, the EMS personnel and a relative. The history is limited by the condition of the patient.  Hematuria Associated symptoms include abdominal pain. Pertinent negatives include no chest pain and no headaches.    Past Medical History:  Diagnosis Date   Anginal pain ([place])    Arthritis    mostly hands   Benign familial hematuria    BPH (benign prostatic hypertrophy)    Brain cancer ([place])    melanoma with brain met   Coronary artery disease    GERD (gastroesophageal reflux  disease)    High cholesterol    Hypertension    Hypothyroidism    Intracerebral hemorrhage (HCC)    Melanoma ([place])    Metastatic melanoma to lung ([place])    Mood disorder ([place])    Myocardial infarction [facility])    Pacemaker    due to syncope, 3rd degree HB (upgrade to [employer] CRT-P 02/25/13 (Dr. Uvaldo Rising)   Rectal bleed    due to NSAIDS   Renal disorder    Skin cancer    melanoma   Sleep apnea    uses CPAP   Stroke [facility])     Patient Active Problem List   Diagnosis Date Noted   Acute pain of left wrist 11/15/2018   Insomnia 10/14/2018   Heart block AV complete ([place]) 10/01/2018   Presence of biventricular cardiac pacemaker 10/01/2018   Hypogonadism in male 04/18/2018   Dandruff in adult 03/27/2018   Dyslipidemia 03/27/2018   Acute pain of right shoulder 03/11/2018   Elevated PSA 12/24/2017   Prediabetes 12/24/2017   Hx of CABG 07/17/2017   Alcohol use 04/19/2017   Cognitive impairment 02/25/2017   History of implantation of joint prosthesis of elbow 02/12/2017   Osteoarthritis of knee 02/12/2017   Hypercalcemia 11/01/2016   Nontraumatic cortical hemorrhage of left cerebral hemisphere [facility]) 06/27/2016   Hematochezia 05/06/2016   Advanced care planning/counseling discussion 05/03/2016   Atherosclerosis of native coronary artery of native heart with stable angina  pectoris ([place]) 04/24/2016   Ischemic cardiomyopathy 04/24/2016   Acquired hypothyroidism 11/24/2015   Metastatic melanoma to lung [facility])    MDD (major depressive disorder), recurrent episode, moderate (HCC)    Benign prostatic hyperplasia    Metastasis to brain ([place]) 10/15/2015   Malignant melanoma ([place]) 09/22/2015   Lung mass    Chronic kidney disease, stage III (moderate) (HCC)    Overweight with body mass index (BMI) 25.0-29.9 08/26/2015   Incontinence 08/26/2015   Hemorrhagic stroke ([place]) 08/26/2015   Hemiplegia and hemiparesis following nontraumatic intracerebral  hemorrhage affecting right dominant side ([place]) 08/26/2015   Aphasia following nontraumatic intracerebral hemorrhage 08/26/2015   Gait disturbance, post-stroke 08/26/2015   Benign essential HTN    OSA on CPAP    Biventricular ICD (implantable cardioverter-defibrillator) in place    ICH (intracerebral hemorrhage) ([place]) - L frontal due to HTN vs CAA 08/22/2015    Past Surgical History:  Procedure Laterality Date   APPLICATION OF CRANIAL NAVIGATION N/A 10/15/2015   Procedure: APPLICATION OF CRANIAL NAVIGATION;  Surgeon: Erline Levine, MD;  Location: [place] [facility];  Service: Neurosurgery;  Laterality: N/A;   CARDIAC CATHETERIZATION  2013   CARDIAC SURGERY     bypass X 2   COLONOSCOPY WITH PROPOFOL N/A 05/08/2016   diverticulosis, int hem, no f/u needed [name])   CORONARY ARTERY BYPASS GRAFT  2013   LIMA-LAD, SVG-PDA 03/27/11 (Dr. Francee Gentile)   [place] N/A 10/15/2015   Procedure: LEFT FRONTAL CRANIOTOMY TUMOR EXCISION with Curve;  Surgeon: Erline Levine, MD;  Location: [place] [facility];  Service: Neurosurgery;  Laterality: N/A;  CRANIOTOMY TUMOR EXCISION   EYE SURGERY     FOOT SURGERY  08/2010   JOINT REPLACEMENT Left    partial knee   KNEE ARTHROSCOPY  12/2008   LYMPH NODE BIOPSY     PACEMAKER INSERTION     TONSILLECTOMY          Home Medications    Prior to Admission medications   Medication Sig Start Date End Date Taking? Authorizing Provider  acetaminophen (TYLENOL) 500 MG tablet Take 500 mg by mouth at bedtime.    [provider]  amLODipine (NORVASC) 5 MG tablet TAKE ONE TABLET BY MOUTH EVERY DAY 01/02/18   [name], MD  aspirin EC 81 MG tablet Take 81 mg by mouth daily.    [provider]  cephALEXin (KEFLEX) 500 MG capsule Take 1 capsule (500 mg total) by mouth 3 (three) times daily. 11/15/18   Bedsole, Amy E, MD  Cholecalciferol (VITAMIN D3) 1000 units CAPS Take 1 capsule (1,000 Units total) by mouth daily. 12/24/17   [name], MD    citalopram (CELEXA) 10 MG tablet Take 1 tablet (10 mg total) by mouth daily. 10/07/18   [name], MD  fluocinonide cream (LIDEX) 0.05 % Apply topically 2 (two) times daily as needed. 05/07/18   [name], MD  ketoconazole (NIZORAL) 2 % shampoo Apply 1 application topically 2 (two) times a week. 03/28/18   [name], MD  levothyroxine (SYNTHROID, LEVOTHROID) 137 MCG tablet TAKE 1 TABLET BY MOUTH ONCE DAILY BEFORE BREAKFAST 12/24/17   [name], MD  methylphenidate (RITALIN) 10 MG tablet Take 1 tablet (10 mg total) by mouth daily. 04/01/18   Ventura Sellers, MD  metoprolol succinate (TOPROL-XL) 50 MG 24 hr tablet TAKE 1 AND 1/2 TABLET (75 MG) BY MOUTH EVERY DAY WITH OR IMMEDIATELY FOLLOWING A MEAL *DO NOT CRUSH* 01/02/18   [name], MD  pentoxifylline (TRENTAL) 400 MG  CR tablet Take 1 tablet (400 mg total) by mouth 2 (two) times daily. 07/24/16   Hayden Pedro, PA-C  predniSONE (DELTASONE) 20 MG tablet 3 tabs by mouth daily x 3 days, then 2 tabs by mouth daily x 2 days then 1 tab by mouth daily x 2 days 11/15/18   Diona Browner, Amy E, MD  rosuvastatin (CRESTOR) 20 MG tablet TAKE ONE TABLET BY MOUTH AT BEDTIME 11/08/17   Minna Merritts, MD  testosterone cypionate (DEPOTESTOTERONE CYPIONATE) 100 MG/ML injection Inject 1 mL (100 mg total) into the muscle every 14 (fourteen) days. 10/10/18   [name], MD  traZODone (DESYREL) 50 MG tablet Take 0.5-1 tablets (25-50 mg total) by mouth at bedtime. 10/14/18   [name], MD  Turmeric 500 MG CAPS Take 2 capsules by mouth daily.    [provider]  vitamin E (VITAMIN E) 400 UNIT capsule Take 1 capsule (400 Units total) by mouth 2 (two) times daily. 07/24/16   Hayden Pedro, PA-C    Family History Family History  Problem Relation Age of Onset   Cancer Mother 9       lung    Hypertension Mother    Hypertension Father    Heart attack Father    Heart disease Father    Rheum arthritis  Sister    Cancer Maternal Grandmother     Social History Social History   Tobacco Use   Smoking status: Former Smoker    Packs/day: 1.00    Years: 3.00    Pack years: 3.00    Quit date: 04/10/1957    Years since quitting: 61.7   Smokeless tobacco: Never Used  Substance Use Topics   Alcohol use: Yes    Alcohol/week: 14.0 standard drinks    Types: 10 Glasses of wine, 4 Cans of beer per week   Drug use: No     Allergies   Ezetimibe and Simvastatin   Review of Systems Review of Systems  Constitutional: Positive for activity change, appetite change, fatigue and fever.  Eyes: Negative for visual disturbance.  Cardiovascular: Negative for chest pain.  Gastrointestinal: Positive for abdominal pain. Negative for nausea and vomiting.  Genitourinary: Positive for dysuria, flank pain, hematuria and urgency.  Musculoskeletal: Positive for arthralgias and myalgias.  Neurological: Positive for weakness. Negative for dizziness, light-headedness and headaches.   all other systems are negative except as noted in the HPI and PMH.     Physical Exam Updated Vital Signs Temp (!) 102.9 F (39.4 C) (Oral)   Physical Exam Vitals signs and nursing note reviewed.  Constitutional:      General: He is in acute distress.     Appearance: He is well-developed.     Comments: Ill-appearing, oriented to person place  HENT:     Head: Normocephalic and atraumatic.     Mouth/Throat:     Pharynx: No oropharyngeal exudate.  Eyes:     Conjunctiva/sclera: Conjunctivae normal.     Pupils: Pupils are equal, round, and reactive to light.  Neck:     Musculoskeletal: Normal range of motion and neck supple.     Comments: No meningismus. Cardiovascular:     Rate and Rhythm: Normal rate and regular rhythm.     Heart sounds: Normal heart sounds. No murmur.  Pulmonary:     Effort: Pulmonary effort is normal. No respiratory distress.     Breath sounds: Normal breath sounds.  Abdominal:      Palpations: Abdomen is soft.  Tenderness: There is no abdominal tenderness. There is no guarding or rebound.  Genitourinary:    Comments: No testicular tenderness Musculoskeletal: Normal range of motion.        General: Swelling and tenderness present.     Comments: Streaking erythema to dorsal right foot and lower leg with lymphangitis.  Intact DP and PT pulses.  Full range of motion of toes and ankle.  No open wound  Skin:    General: Skin is warm.     Capillary Refill: Capillary refill takes less than 2 seconds.     Findings: Rash present.  Neurological:     General: No focal deficit present.     Mental Status: He is alert.     Cranial Nerves: No cranial nerve deficit.     Motor: No abnormal muscle tone.     Coordination: Coordination normal.     Comments: Mildly confused, oriented to person and place.  Answers questions appropriately.  5/5 strength throughout  Psychiatric:        Behavior: Behavior normal.      ED Treatments / Results  Labs (all labs ordered are listed, but only abnormal results are displayed) Labs Reviewed  URINALYSIS, ROUTINE W REFLEX MICROSCOPIC - Abnormal; Notable for the following components:      Result Value   APPearance HAZY (*)    Hgb urine dipstick LARGE (*)    Protein, ur >=300 (*)    RBC / HPF >50 (*)    Bacteria, UA RARE (*)    All other components within normal limits  COMPREHENSIVE METABOLIC PANEL - Abnormal; Notable for the following components:   CO2 17 (*)    Glucose, Bld 171 (*)    BUN 30 (*)    Creatinine, Ser 2.04 (*)    Calcium 8.8 (*)    GFR calc non Af Amer 30 (*)    GFR calc Af Amer 35 (*)    All other components within normal limits  CBC WITH DIFFERENTIAL/PLATELET - Abnormal; Notable for the following components:   WBC 1.5 (*)    RBC 3.76 (*)    Hemoglobin 12.3 (*)    HCT 36.3 (*)    Platelets 118 (*)    nRBC 1.4 (*)    Neutro Abs 0.9 (*)    Lymphs Abs 0.2 (*)    Abs Immature Granulocytes 0.19 (*)    All other  components within normal limits  APTT - Abnormal; Notable for the following components:   aPTT 39 (*)    All other components within normal limits  PROTIME-INR - Abnormal; Notable for the following components:   Prothrombin Time 16.7 (*)    INR 1.4 (*)    All other components within normal limits  SEDIMENTATION RATE - Abnormal; Notable for the following components:   Sed Rate 34 (*)    All other components within normal limits  C-REACTIVE PROTEIN - Abnormal; Notable for the following components:   CRP 10.4 (*)    All other components within normal limits  COMPREHENSIVE METABOLIC PANEL - Abnormal; Notable for the following components:   CO2 17 (*)    Glucose, Bld 165 (*)    BUN 29 (*)    Creatinine, Ser 1.98 (*)    Calcium 8.2 (*)    Total Protein 5.9 (*)    Albumin 3.1 (*)    GFR calc non Af Amer 31 (*)    GFR calc Af Amer 36 (*)    All other components within normal  limits  CBC - Abnormal; Notable for the following components:   WBC 1.5 (*)    RBC 3.48 (*)    Hemoglobin 11.3 (*)    HCT 34.2 (*)    Platelets 106 (*)    All other components within normal limits  TROPONIN I (HIGH SENSITIVITY) - Abnormal; Notable for the following components:   Troponin I (High Sensitivity) 28 (*)    All other components within normal limits  TROPONIN I (HIGH SENSITIVITY) - Abnormal; Notable for the following components:   Troponin I (High Sensitivity) 49 (*)    All other components within normal limits  SARS CORONAVIRUS 2 (HOSPITAL ORDER, [facility])  URINE CULTURE  CULTURE, BLOOD (ROUTINE X 2)  CULTURE, BLOOD (ROUTINE X 2)  LACTIC ACID, PLASMA  LACTIC ACID, PLASMA    EKG EKG Interpretation  Date/Time:  Sunday December 08 2018 23:36:29 EDT Ventricular Rate:  98 PR Interval:    QRS Duration: 210 QT Interval:  427 QTC Calculation: 546 R Axis:   -126 Text Interpretation:  Sinus rhythm Right bundle branch block V paced similar to previous Confirmed by  Ezequiel Essex [phone]) on 12/08/2018 11:42:58 PM   Radiology Ct Head Wo Contrast  Result Date: 12/09/2018 CLINICAL DATA:  Altered level of consciousness, sepsis EXAM: CT HEAD WITHOUT CONTRAST TECHNIQUE: Contiguous axial images were obtained from the base of the skull through the vertex without intravenous contrast. COMPARISON:  Most recent comparison CT September 18, 2018 FINDINGS: Brain: A previously described 4 mm enhancing lesion in the right cerebellum is not well seen in the absence of contrast on today's study. There is a redemonstrated focus of encephalomalacia in the anterior left frontal lobe with associated ex vacuo dilatation of the anterior horn of the left lateral ventricle. No new intracranial lesions are seen. No evidence of acute infarction, hemorrhage, hydrocephalus, extra-axial collection or mass lesion/mass effect. Symmetric prominence of the ventricles, cisterns and sulci compatible with parenchymal volume loss. Patchy areas of white matter hypoattenuation are most compatible with chronic microvascular angiopathy. Vascular: Atherosclerotic calcification of the carotid siphons and intradural vertebral arteries. Skull: Remote left pterional craniotomy changes are again seen. Few prominent venous lakes are unchanged from prior. No calvarial fracture or suspicious osseous lesion. No scalp swelling or hematoma. Sinuses/Orbits: Minimal mural thickening in the left maxillary, sphenoid and ethmoid sinuses. Some pneumatized secretions are present within the ethmoids as well. Bilateral lens extractions are noted. Left scleral buckle is seen. Orbits are otherwise unremarkable. Other: None IMPRESSION: 1. No acute intracranial abnormality. 2. A previously described 4 mm enhancing lesion in the right cerebellum is not well seen in the absence of contrast on today's study. No new intracranial lesions are seen. Electronically Signed   By: Lovena Le M.D.   On: 12/09/2018 02:29   Dg Chest Port 1  View  Result Date: 12/09/2018 CLINICAL DATA:  Sepsis EXAM: PORTABLE CHEST 1 VIEW COMPARISON:  Radiograph 05/24/2017, CT chest 09/01/2015 FINDINGS: Postsurgical changes related to prior CABG including intact and aligned sternotomy wires and multiple surgical clips projecting over the mediastinum. Three lead pacer battery pack overlies the left chest wall with leads in the right atrium, cardiac apex and coronary sinus. Cardiac silhouette is enlarged accounting for the portable technique. The aorta is calcified. There is tortuosity of the right brachiocephalic vessels as well. Calcifications noted at the carotid bifurcations. Lung volumes are diminished. Streaky opacities are present in the lung bases with more focal right infrahilar opacity. Pulmonary vascularity is  somewhat cephalized. IMPRESSION: Findings are compatible with CHF/volume overload with mild edema and cardiomegaly in this patient with history of CABG. Early infectious process could have a similar appearance, particularly in the lung bases, and should be excluded on a clinical basis. Electronically Signed   By: Lovena Le M.D.   On: 12/09/2018 00:16   Dg Foot Complete Right  Result Date: 12/09/2018 CLINICAL DATA:  Possible sepsis, redness on foot EXAM: RIGHT FOOT COMPLETE - 3+ VIEW COMPARISON:  None. FINDINGS: Postsurgical changes from prior first tarsometatarsal arthrodesis secured by 2 fully threaded screws. An additional fully threaded screw at the anterior process of the calcaneus, likely related to prior ORIF, correlate with surgical history. Marked hallux valgus deformity across the first metatarsophalangeal joint with hypertrophic change. There is remote posttraumatic change of the second proximal middle phalanges with posttraumatic fusion. Additional fusion is seen across the fourth middle and distal phalanges. There is diffuse soft tissue swelling and vascular calcification. No convincing features of osteomyelitis such as cortical  destruction, periostitis or erosive change though evaluation is limited by the background of extensive degenerative features throughout the foot. There is a small linear radiodensity in the soft tissues adjacent the base of the fifth proximal phalanx which could reflect a small foreign body. No subcutaneous gas. IMPRESSION: 1. No convincing features of osteomyelitis though evaluation is limited by the background of extensive degenerative changes and postsurgical features throughout the foot. 2. Small linear radiodensity in the soft tissues adjacent to the base of the fifth proximal phalanx which could reflect a small foreign body. Electronically Signed   By: Lovena Le M.D.   On: 12/09/2018 00:22   Ct Renal Stone Study  Result Date: 12/09/2018 CLINICAL DATA:  Altered level of consciousness, sepsis EXAM: CT ABDOMEN AND PELVIS WITHOUT CONTRAST TECHNIQUE: Multidetector CT imaging of the abdomen and pelvis was performed following the standard protocol without IV contrast. COMPARISON:  Chest radiograph December 08, 2018, PET-CT September 18, 2018 FINDINGS: Lower chest: Minimal atelectatic changes in the lung bases. 3 lead pacer wires are better visualized on scout radiography. Normal heart size. No pericardial effusion. Hepatobiliary: No focal liver abnormality is seen. No gallstones, gallbladder wall thickening, or biliary dilatation. Pancreas: Mild atrophy of the pancreas. No peripancreatic inflammation or ductal dilatation. Spleen: Normal in size without focal abnormality. Adrenals/Urinary Tract: Normal adrenal glands. Bilateral mild renal atrophy with cortical scarring in both kidneys. Mild bilateral nonspecific perinephric stranding, a nonspecific finding though may correlate with either age or decreased renal function. No visible urolithiasis. No hydronephrosis. No concerning renal masses. Urinary bladder is unremarkable. Stomach/Bowel: Distal esophagus, stomach and duodenal sweep are unremarkable. No bowel wall  thickening or dilatation. No evidence of obstruction. Cecum is partially displaced into the right upper quadrant. Extensive pancolonic diverticulosis is present without focal pericolonic inflammatory features to suggest diverticulitis. Vascular/Lymphatic: Atherosclerotic plaque is present throughout the aorta. There is mild fusiform dilatation of the infrarenal abdominal aorta up to 3.1 cm in maximal diameter. Mild ectasia of the common iliacs is noted as well measuring up to 2.3 cm on the left and 2.1 cm on the right. No suspicious or enlarged lymph nodes in the included lymphatic chains. Reproductive: The prostate and seminal vesicles are unremarkable. Other: No abdominopelvic free fluid or free gas. No bowel containing hernias. Small fat containing inguinal hernias are noted. Small fat containing umbilical hernia is present. Musculoskeletal: Multilevel degenerative changes are present in the imaged portions of the spine. There is focal levocurvature at L1-2 with partial  fusion across the L1-2 levels. Discogenic and facet degenerative changes result in at least mild canal stenosis at L2-3. Moderate to severe foraminal narrowing is seen bilaterally at L4-5. No acute osseous abnormality or suspicious osseous lesion. IMPRESSION: 1. No acute intra-abdominal process. Specifically no evidence of urolithiasis or hydronephrosis. 2. Mild bilateral renal atrophy. 3. Extensive pancolonic diverticulosis without evidence of diverticulitis. 4. Mild fusiform dilatation of the infrarenal abdominal aorta up to 3.1 cm in maximal diameter. Mild ectasia of the common iliacs bilaterally. Recommend followup by ultrasound in 3 years. This recommendation follows ACR consensus guidelines: White Paper of the ACR Incidental Findings Committee II on Vascular Findings. J Am Coll Radiol 2013; 10:789-794. Aortic aneurysm NOS (ICD10-I71.9) 5. Aortic Atherosclerosis (ICD10-I70.0). Electronically Signed   By: Lovena Le M.D.   On: 12/09/2018  02:37    Procedures Procedures (including critical care time)  Medications Ordered in ED Medications  ceFEPIme (MAXIPIME) 2 g in sodium chloride 0.9 % 100 mL IVPB (has no administration in time range)  metroNIDAZOLE (FLAGYL) IVPB 500 mg (has no administration in time range)  vancomycin (VANCOCIN) IVPB 1000 mg/200 mL premix (has no administration in time range)  sodium chloride 0.9 % bolus 1,000 mL (has no administration in time range)     Initial Impression / Assessment and Plan / ED Course  I have reviewed the triage vital signs and the nursing notes.  Pertinent labs & imaging results that were available during my care of the patient were reviewed by me and considered in my medical decision making (see chart for details).       Patient from home with altered mental status, fever and urinary symptoms for the past several days.  He is febrile on arrival.  Code sepsis activated  Patient given judicious IV fluids and broad-spectrum antibiotics after cultures were obtained.  He denies any trouble breathing but is mildly tachypneic.  His x-ray shows mild vascular congestion.  Will not give 30 cc/kg bolus due to this.  Lactate is normal.  X-ray report shows questionable foreign body which is not seen on physical exam. Labs show AKI as well as leukopenia which is daughter states was recently diagnosed several weeks ago and they are in the process of getting worked up.  He is not had any chemo recently. [place] 870  UA shows hematuria without obvious infection. Repeat lactate is normal.  CT head is stable and CT abdomen pelvis did not show any evidence of acute flexion.  Does show known aortic aneurysm.  Coronavirus testing is negative.  Blood pressure mental status remained stable throughout ED course.  Patient will be admitted for ongoing sepsis likely secondary to foot cellulitis and possible UTI.  Admission discussed with Dr. Maudie Mercury.  Philip Kotlyar. was evaluated in Emergency  Department on 12/09/2018 for the symptoms described in the history of present illness. He was evaluated in the context of the global COVID-19 pandemic, which necessitated consideration that the patient might be at risk for infection with the SARS-CoV-2 virus that causes COVID-19. Institutional protocols and algorithms that pertain to the evaluation of patients at risk for COVID-19 are in a state of rapid change based on information released by regulatory bodies including the CDC and federal and state organizations. These policies and algorithms were followed during the patient's care in the ED.  CRITICAL CARE Performed by: Ezequiel Essex Total critical care time: 60 minutes Critical care time was exclusive of separately billable procedures and treating other patients. Critical care was necessary  to treat or prevent imminent or life-threatening deterioration. Critical care was time spent personally by me on the following activities: development of treatment plan with patient and/or surrogate as well as nursing, discussions with consultants, evaluation of patient's response to treatment, examination of patient, obtaining history from patient or surrogate, ordering and performing treatments and interventions, ordering and review of laboratory studies, ordering and review of radiographic studies, pulse oximetry and re-evaluation of patient's condition.  Final Clinical Impressions(s) / ED Diagnoses   Final diagnoses:  Erythema  Sepsis with encephalopathy without septic shock, due to unspecified organism [facility])    ED Discharge Orders    None       Ezequiel Essex, MD 12/09/18 [phone]

## 2018-12-08 NOTE — ED Notes (Signed)
Bladder scan 350 ml, pt has urgency

## 2018-12-09 ENCOUNTER — Encounter (HOSPITAL_COMMUNITY): Payer: Self-pay | Admitting: General Practice

## 2018-12-09 ENCOUNTER — Inpatient Hospital Stay (HOSPITAL_COMMUNITY): Payer: Medicare Other

## 2018-12-09 ENCOUNTER — Other Ambulatory Visit: Payer: Self-pay

## 2018-12-09 ENCOUNTER — Emergency Department (HOSPITAL_COMMUNITY): Payer: Medicare Other

## 2018-12-09 DIAGNOSIS — M7989 Other specified soft tissue disorders: Secondary | ICD-10-CM | POA: Diagnosis not present

## 2018-12-09 DIAGNOSIS — I517 Cardiomegaly: Secondary | ICD-10-CM | POA: Diagnosis present

## 2018-12-09 DIAGNOSIS — N189 Chronic kidney disease, unspecified: Secondary | ICD-10-CM | POA: Diagnosis not present

## 2018-12-09 DIAGNOSIS — Z8546 Personal history of malignant neoplasm of prostate: Secondary | ICD-10-CM | POA: Diagnosis not present

## 2018-12-09 DIAGNOSIS — K5730 Diverticulosis of large intestine without perforation or abscess without bleeding: Secondary | ICD-10-CM | POA: Diagnosis not present

## 2018-12-09 DIAGNOSIS — C7951 Secondary malignant neoplasm of bone: Secondary | ICD-10-CM | POA: Diagnosis present

## 2018-12-09 DIAGNOSIS — M109 Gout, unspecified: Secondary | ICD-10-CM | POA: Diagnosis present

## 2018-12-09 DIAGNOSIS — N400 Enlarged prostate without lower urinary tract symptoms: Secondary | ICD-10-CM | POA: Diagnosis present

## 2018-12-09 DIAGNOSIS — Z95810 Presence of automatic (implantable) cardiac defibrillator: Secondary | ICD-10-CM | POA: Diagnosis not present

## 2018-12-09 DIAGNOSIS — Z20828 Contact with and (suspected) exposure to other viral communicable diseases: Secondary | ICD-10-CM | POA: Diagnosis present

## 2018-12-09 DIAGNOSIS — R509 Fever, unspecified: Secondary | ICD-10-CM | POA: Diagnosis not present

## 2018-12-09 DIAGNOSIS — G9341 Metabolic encephalopathy: Secondary | ICD-10-CM | POA: Diagnosis present

## 2018-12-09 DIAGNOSIS — M19032 Primary osteoarthritis, left wrist: Secondary | ICD-10-CM | POA: Diagnosis not present

## 2018-12-09 DIAGNOSIS — A419 Sepsis, unspecified organism: Secondary | ICD-10-CM

## 2018-12-09 DIAGNOSIS — I1310 Hypertensive heart and chronic kidney disease without heart failure, with stage 1 through stage 4 chronic kidney disease, or unspecified chronic kidney disease: Secondary | ICD-10-CM | POA: Diagnosis present

## 2018-12-09 DIAGNOSIS — C7800 Secondary malignant neoplasm of unspecified lung: Secondary | ICD-10-CM | POA: Diagnosis present

## 2018-12-09 DIAGNOSIS — D61818 Other pancytopenia: Secondary | ICD-10-CM | POA: Diagnosis present

## 2018-12-09 DIAGNOSIS — D696 Thrombocytopenia, unspecified: Secondary | ICD-10-CM

## 2018-12-09 DIAGNOSIS — I25118 Atherosclerotic heart disease of native coronary artery with other forms of angina pectoris: Secondary | ICD-10-CM | POA: Diagnosis present

## 2018-12-09 DIAGNOSIS — N390 Urinary tract infection, site not specified: Secondary | ICD-10-CM | POA: Diagnosis present

## 2018-12-09 DIAGNOSIS — N261 Atrophy of kidney (terminal): Secondary | ICD-10-CM | POA: Diagnosis not present

## 2018-12-09 DIAGNOSIS — I129 Hypertensive chronic kidney disease with stage 1 through stage 4 chronic kidney disease, or unspecified chronic kidney disease: Secondary | ICD-10-CM | POA: Diagnosis not present

## 2018-12-09 DIAGNOSIS — N170 Acute kidney failure with tubular necrosis: Secondary | ICD-10-CM | POA: Diagnosis present

## 2018-12-09 DIAGNOSIS — D709 Neutropenia, unspecified: Secondary | ICD-10-CM | POA: Diagnosis not present

## 2018-12-09 DIAGNOSIS — N029 Recurrent and persistent hematuria with unspecified morphologic changes: Secondary | ICD-10-CM | POA: Diagnosis present

## 2018-12-09 DIAGNOSIS — R6520 Severe sepsis without septic shock: Secondary | ICD-10-CM | POA: Diagnosis not present

## 2018-12-09 DIAGNOSIS — K219 Gastro-esophageal reflux disease without esophagitis: Secondary | ICD-10-CM | POA: Diagnosis present

## 2018-12-09 DIAGNOSIS — C439 Malignant melanoma of skin, unspecified: Secondary | ICD-10-CM | POA: Diagnosis present

## 2018-12-09 DIAGNOSIS — R4182 Altered mental status, unspecified: Secondary | ICD-10-CM | POA: Diagnosis not present

## 2018-12-09 DIAGNOSIS — N179 Acute kidney failure, unspecified: Secondary | ICD-10-CM

## 2018-12-09 DIAGNOSIS — C7931 Secondary malignant neoplasm of brain: Secondary | ICD-10-CM | POA: Diagnosis not present

## 2018-12-09 DIAGNOSIS — R918 Other nonspecific abnormal finding of lung field: Secondary | ICD-10-CM | POA: Diagnosis not present

## 2018-12-09 DIAGNOSIS — C61 Malignant neoplasm of prostate: Secondary | ICD-10-CM | POA: Diagnosis present

## 2018-12-09 DIAGNOSIS — N183 Chronic kidney disease, stage 3 (moderate): Secondary | ICD-10-CM | POA: Diagnosis present

## 2018-12-09 DIAGNOSIS — I69151 Hemiplegia and hemiparesis following nontraumatic intracerebral hemorrhage affecting right dominant side: Secondary | ICD-10-CM | POA: Diagnosis not present

## 2018-12-09 DIAGNOSIS — R5081 Fever presenting with conditions classified elsewhere: Secondary | ICD-10-CM | POA: Diagnosis present

## 2018-12-09 DIAGNOSIS — N281 Cyst of kidney, acquired: Secondary | ICD-10-CM | POA: Diagnosis not present

## 2018-12-09 DIAGNOSIS — L03115 Cellulitis of right lower limb: Secondary | ICD-10-CM | POA: Diagnosis present

## 2018-12-09 DIAGNOSIS — G9389 Other specified disorders of brain: Secondary | ICD-10-CM | POA: Diagnosis not present

## 2018-12-09 DIAGNOSIS — I252 Old myocardial infarction: Secondary | ICD-10-CM | POA: Diagnosis not present

## 2018-12-09 DIAGNOSIS — E039 Hypothyroidism, unspecified: Secondary | ICD-10-CM | POA: Diagnosis present

## 2018-12-09 DIAGNOSIS — I248 Other forms of acute ischemic heart disease: Secondary | ICD-10-CM | POA: Diagnosis present

## 2018-12-09 DIAGNOSIS — I10 Essential (primary) hypertension: Secondary | ICD-10-CM | POA: Diagnosis not present

## 2018-12-09 HISTORY — DX: Sepsis, unspecified organism: A41.9

## 2018-12-09 LAB — CBC WITH DIFFERENTIAL/PLATELET
Abs Immature Granulocytes: 0.19 10*3/uL — ABNORMAL HIGH (ref 0.00–0.07)
Basophils Absolute: 0 10*3/uL (ref 0.0–0.1)
Basophils Relative: 0 %
Eosinophils Absolute: 0.1 10*3/uL (ref 0.0–0.5)
Eosinophils Relative: 3 %
HCT: 36.3 % — ABNORMAL LOW (ref 39.0–52.0)
Hemoglobin: 12.3 g/dL — ABNORMAL LOW (ref 13.0–17.0)
Immature Granulocytes: 13 %
Lymphocytes Relative: 16 %
Lymphs Abs: 0.2 10*3/uL — ABNORMAL LOW (ref 0.7–4.0)
MCH: 32.7 pg (ref 26.0–34.0)
MCHC: 33.9 g/dL (ref 30.0–36.0)
MCV: 96.5 fL (ref 80.0–100.0)
Monocytes Absolute: 0.1 10*3/uL (ref 0.1–1.0)
Monocytes Relative: 10 %
Neutro Abs: 0.9 10*3/uL — ABNORMAL LOW (ref 1.7–7.7)
Neutrophils Relative %: 58 %
Platelets: 118 10*3/uL — ABNORMAL LOW (ref 150–400)
RBC: 3.76 MIL/uL — ABNORMAL LOW (ref 4.22–5.81)
RDW: 14.8 % (ref 11.5–15.5)
WBC: 1.5 10*3/uL — ABNORMAL LOW (ref 4.0–10.5)
nRBC: 1.4 % — ABNORMAL HIGH (ref 0.0–0.2)

## 2018-12-09 LAB — COMPREHENSIVE METABOLIC PANEL
ALT: 16 U/L (ref 0–44)
ALT: 17 U/L (ref 0–44)
AST: 16 U/L (ref 15–41)
AST: 18 U/L (ref 15–41)
Albumin: 3.1 g/dL — ABNORMAL LOW (ref 3.5–5.0)
Albumin: 3.6 g/dL (ref 3.5–5.0)
Alkaline Phosphatase: 44 U/L (ref 38–126)
Alkaline Phosphatase: 44 U/L (ref 38–126)
Anion gap: 12 (ref 5–15)
Anion gap: 14 (ref 5–15)
BUN: 29 mg/dL — ABNORMAL HIGH (ref 8–23)
BUN: 30 mg/dL — ABNORMAL HIGH (ref 8–23)
CO2: 17 mmol/L — ABNORMAL LOW (ref 22–32)
CO2: 17 mmol/L — ABNORMAL LOW (ref 22–32)
Calcium: 8.2 mg/dL — ABNORMAL LOW (ref 8.9–10.3)
Calcium: 8.8 mg/dL — ABNORMAL LOW (ref 8.9–10.3)
Chloride: 105 mmol/L (ref 98–111)
Chloride: 109 mmol/L (ref 98–111)
Creatinine, Ser: 1.98 mg/dL — ABNORMAL HIGH (ref 0.61–1.24)
Creatinine, Ser: 2.04 mg/dL — ABNORMAL HIGH (ref 0.61–1.24)
GFR calc Af Amer: 35 mL/min — ABNORMAL LOW (ref >60)
GFR calc Af Amer: 36 mL/min — ABNORMAL LOW (ref >60)
GFR calc non Af Amer: 30 mL/min — ABNORMAL LOW (ref >60)
GFR calc non Af Amer: 31 mL/min — ABNORMAL LOW (ref >60)
Glucose, Bld: 165 mg/dL — ABNORMAL HIGH (ref 70–99)
Glucose, Bld: 171 mg/dL — ABNORMAL HIGH (ref 70–99)
Potassium: 4.6 mmol/L (ref 3.5–5.1)
Potassium: 4.6 mmol/L (ref 3.5–5.1)
Sodium: 136 mmol/L (ref 135–145)
Sodium: 138 mmol/L (ref 135–145)
Total Bilirubin: 0.9 mg/dL (ref 0.3–1.2)
Total Bilirubin: 0.9 mg/dL (ref 0.3–1.2)
Total Protein: 5.9 g/dL — ABNORMAL LOW (ref 6.5–8.1)
Total Protein: 6.6 g/dL (ref 6.5–8.1)

## 2018-12-09 LAB — CBC
HCT: 34.2 % — ABNORMAL LOW (ref 39.0–52.0)
Hemoglobin: 11.3 g/dL — ABNORMAL LOW (ref 13.0–17.0)
MCH: 32.5 pg (ref 26.0–34.0)
MCHC: 33 g/dL (ref 30.0–36.0)
MCV: 98.3 fL (ref 80.0–100.0)
Platelets: 106 10*3/uL — ABNORMAL LOW (ref 150–400)
RBC: 3.48 MIL/uL — ABNORMAL LOW (ref 4.22–5.81)
RDW: 15 % (ref 11.5–15.5)
WBC: 1.5 10*3/uL — ABNORMAL LOW (ref 4.0–10.5)
nRBC: 0 % (ref 0.0–0.2)

## 2018-12-09 LAB — URINALYSIS, ROUTINE W REFLEX MICROSCOPIC
Bilirubin Urine: NEGATIVE
Glucose, UA: NEGATIVE mg/dL
Ketones, ur: NEGATIVE mg/dL
Leukocytes,Ua: NEGATIVE
Nitrite: NEGATIVE
Protein, ur: >=300 mg/dL — AB
RBC / HPF: >50 RBC/hpf — ABNORMAL HIGH (ref 0–5)
Specific Gravity, Urine: 1.015 (ref 1.005–1.030)
pH: 6 (ref 5.0–8.0)

## 2018-12-09 LAB — ECHOCARDIOGRAM COMPLETE

## 2018-12-09 LAB — LACTIC ACID, PLASMA
Lactic Acid, Venous: 0.9 mmol/L (ref 0.5–1.9)
Lactic Acid, Venous: 1.1 mmol/L (ref 0.5–1.9)

## 2018-12-09 LAB — APTT: aPTT: 39 seconds — ABNORMAL HIGH (ref 24–36)

## 2018-12-09 LAB — TROPONIN I (HIGH SENSITIVITY)
Troponin I (High Sensitivity): 28 ng/L — ABNORMAL HIGH (ref <18)
Troponin I (High Sensitivity): 49 ng/L — ABNORMAL HIGH (ref <18)

## 2018-12-09 LAB — PATHOLOGIST SMEAR REVIEW

## 2018-12-09 LAB — SEDIMENTATION RATE: Sed Rate: 34 mm/hr — ABNORMAL HIGH (ref 0–16)

## 2018-12-09 LAB — PROTIME-INR
INR: 1.4 — ABNORMAL HIGH (ref 0.8–1.2)
Prothrombin Time: 16.7 seconds — ABNORMAL HIGH (ref 11.4–15.2)

## 2018-12-09 LAB — SARS CORONAVIRUS 2 BY RT PCR (HOSPITAL ORDER, PERFORMED IN ~~LOC~~ HOSPITAL LAB): SARS Coronavirus 2: NEGATIVE

## 2018-12-09 LAB — C-REACTIVE PROTEIN: CRP: 10.4 mg/dL — ABNORMAL HIGH (ref <1.0)

## 2018-12-09 MED ORDER — ROSUVASTATIN CALCIUM 20 MG PO TABS
20.0000 mg | ORAL_TABLET | Freq: Every day | ORAL | Status: DC
  Administered 2018-12-09 – 2018-12-12 (×4): 20 mg via ORAL
  Filled 2018-12-09 (×4): qty 1

## 2018-12-09 MED ORDER — CITALOPRAM HYDROBROMIDE 20 MG PO TABS
10.0000 mg | ORAL_TABLET | Freq: Every day | ORAL | Status: DC
  Administered 2018-12-09 – 2018-12-12 (×4): 10 mg via ORAL
  Filled 2018-12-09 (×4): qty 1

## 2018-12-09 MED ORDER — PERFLUTREN LIPID MICROSPHERE
INTRAVENOUS | Status: AC
  Filled 2018-12-09: qty 10

## 2018-12-09 MED ORDER — SODIUM CHLORIDE 0.9 % IV SOLN: 2.0000 g | Freq: Two times a day (BID) | INTRAVENOUS | Status: DC

## 2018-12-09 MED ORDER — SODIUM CHLORIDE 0.9 % IV SOLN: 250.0000 mL | INTRAVENOUS | Status: DC | PRN

## 2018-12-09 MED ORDER — ASPIRIN EC 81 MG PO TBEC
81.0000 mg | DELAYED_RELEASE_TABLET | Freq: Every day | ORAL | Status: DC
  Administered 2018-12-09 – 2018-12-12 (×4): 81 mg via ORAL
  Filled 2018-12-09 (×4): qty 1

## 2018-12-09 MED ORDER — SODIUM CHLORIDE 0.9% FLUSH: 3.0000 mL | INTRAVENOUS | Status: DC | PRN

## 2018-12-09 MED ORDER — SODIUM CHLORIDE 0.9 % IV SOLN
1.0000 g | INTRAVENOUS | Status: DC
  Administered 2018-12-09 – 2018-12-10 (×2): 1 g via INTRAVENOUS
  Filled 2018-12-09 (×2): qty 10

## 2018-12-09 MED ORDER — AMLODIPINE BESYLATE 5 MG PO TABS
5.0000 mg | ORAL_TABLET | Freq: Every day | ORAL | Status: DC
  Administered 2018-12-09 – 2018-12-12 (×4): 5 mg via ORAL
  Filled 2018-12-09 (×4): qty 1

## 2018-12-09 MED ORDER — METHYLPHENIDATE HCL 5 MG PO TABS: 10.0000 mg | ORAL_TABLET | Freq: Every day | ORAL | Status: DC | PRN

## 2018-12-09 MED ORDER — METOPROLOL SUCCINATE ER 50 MG PO TB24
50.0000 mg | ORAL_TABLET | Freq: Every day | ORAL | Status: DC
  Administered 2018-12-09 – 2018-12-12 (×4): 50 mg via ORAL
  Filled 2018-12-09 (×4): qty 1

## 2018-12-09 MED ORDER — TRAZODONE HCL 50 MG PO TABS
25.0000 mg | ORAL_TABLET | Freq: Every day | ORAL | Status: DC
  Administered 2018-12-09 – 2018-12-11 (×3): 50 mg via ORAL
  Filled 2018-12-09 (×3): qty 1

## 2018-12-09 MED ORDER — ACETAMINOPHEN 325 MG PO TABS
650.0000 mg | ORAL_TABLET | Freq: Four times a day (QID) | ORAL | Status: DC | PRN
  Administered 2018-12-09 – 2018-12-12 (×10): 650 mg via ORAL
  Filled 2018-12-09 (×10): qty 2

## 2018-12-09 MED ORDER — ACETAMINOPHEN 650 MG RE SUPP: 650.0000 mg | Freq: Four times a day (QID) | RECTAL | Status: DC | PRN

## 2018-12-09 MED ORDER — VANCOMYCIN HCL 10 G IV SOLR
1250.0000 mg | INTRAVENOUS | Status: DC
  Filled 2018-12-09: qty 1250

## 2018-12-09 MED ORDER — SODIUM CHLORIDE 0.9% FLUSH
3.0000 mL | Freq: Two times a day (BID) | INTRAVENOUS | Status: DC
  Administered 2018-12-09: 3 mL via INTRAVENOUS

## 2018-12-09 MED ORDER — LEVOTHYROXINE SODIUM 25 MCG PO TABS
137.0000 ug | ORAL_TABLET | Freq: Every day | ORAL | Status: DC
  Administered 2018-12-09 – 2018-12-12 (×4): 137 ug via ORAL
  Filled 2018-12-09 (×4): qty 1

## 2018-12-09 NOTE — ED Notes (Signed)
Dr Maudie Mercury at the bedside

## 2018-12-09 NOTE — ED Notes (Signed)
Tele PUI

## 2018-12-09 NOTE — Progress Notes (Addendum)
Progress Note    David [name].  LYY:[id number] DOB: Nov 03, 1940  DOA: 12/08/2018 PCP: [name], MD    Brief Narrative:   Chief complaint: Follow-up fever  Medical records reviewed and are as summarized below:  David [name]. is an 78 y.o. male with a PMH of hypothyroidism, hypertension, hyperlipidemia, CAD status post CABG, history of CVA, and melanoma with brain metastasis complicated by recent leukopenia, thrombocytopenia and ICH who was admitted 12/08/2018 for evaluation of fever and altered mental status associated with generalized weakness and ? mild dysuria.  In the ED, temperature was 102.9 and respiratory rate was 25.  BP was normotensive.  WBC was 1.5 with a hemoglobin of 12.3 and platelets 118.  Creatinine notable for 2.04.  Brain showed a previously described enhancing lesion but no new acute intracranial abnormalities.  Chest x-ray showed findings consistent with CHF/volume overload and mild edema.  CT of the abdomen with renal stone protocol showed no acute intra-abdominal process.  Cultures were obtained and the patient was placed on empiric vancomycin and cefepime.  Source of infection felt to be due to UTI versus right foot cellulitis.  Assessment/Plan:   Principal Problem:   Neutropenia with fever ([place]) in a patient with metastatic melanoma with associated altered mental status and weakness rule out sepsis (present on admission) Broad differential given recent prostate biopsy, erythematous right foot with injury to skin, tachypnea with recent reports of shortness of breath concerning for prostatitis/UTI, cellulitis, and aspiration pneumonia.  Blood cultures and urine culture obtained.  Urinalysis showed hematuria but was negative for nitrites, leukocytes and only had rare bacteria.  Lactic acid not elevated, which is reassuring.  Coronavirus testing negative.  Inflammatory markers (CRP and sed rate) both elevated, but MEWS now down to 1 after being  elevated overnight.  Chest x-ray personally reviewed.  Findings appear to be more consistent with mild pulmonary edema, though early aspiration pneumonia is in the differential.  At this point, the most likely etiology is cellulitis given his physical exam findings.  We will obtain a swallowing evaluation to rule out underlying aspiration, and follow-up on urine cultures to rule out UTI.  Mental status seems to be at usual baseline.  Will narrow antibiotics to cover nonpurulent cellulitis.   Active Problems:   Benign essential HTN Continue amlodipine and metoprolol.  Blood pressure controlled.    OSA on CPAP Will order CPAP nightly.    Hemiplegia and hemiparesis following nontraumatic intracerebral hemorrhage affecting right dominant side (HCC)/Aphasia following nontraumatic intracerebral hemorrhage Stable.    Chronic kidney disease, stage III (moderate) (HCC) Baseline creatinine around 1.56.  Current creatinine slightly elevated over usual baseline values.    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  This post left frontal craniotomy and tumor excision at that time.  Subsequently underwent radiation to the brain and chemotherapy. He has been onXgeva monthly for his bone metastasissince11/2017.     Benign prostatic hyperplasia/newly diagnosed prostate cancer Saw his urologist 11/28/2018 following a prostate biopsy which was performed for elevated PSA on TRT and hematuria.  8/12 cores positive for adenocarcinoma of the prostate with bilateral disease.  Per oncologist notes from Dr. Burr Medico, on 09/23/18, disease felt to be in remission.    Acquired hypothyroidism Continue Synthroid.    Atherosclerosis of native coronary artery of native heart with stable angina pectoris (HCC)/Ischemic cardiomyopathy/Hx of CABG/biventricular ICD in place Continue aspirin and beta-blocker.  Twelve-lead EKG personally  reviewed and showed a V paced sinus rhythm at  98 bpm.  High-sensitivity troponin 49, likely representative of demand ischemia in the setting of acute illness.    Dyslipidemia Continue Crestor.    Thrombocytopenia (HCC) Mild.  Chronic.    Overweight with body mass index (BMI) 25.0-29.9/prediabetes Fasting sugar is mildly elevated.    Depression Continue Celexa and trazodone.    Family Communication/Anticipated D/C date and plan/Code Status   DVT prophylaxis: SCDs ordered. Code Status: Full Code.  Family Communication: Daughter updated by telephone. Disposition Plan: Inpatient appropriate as given his neutropenia/leukopenia and multiple potential etiologies for his fever, will need more clinical certainty before he is safe to discharge home.  Home when fever curve consistently down and culture data resulted.  We will also need to see improvement of the margins of his erythematous right foot   Medical Consultants:    None.   Anti-Infectives:    Cefepime 12/08/2018---> 12/09/2018  Vancomycin 12/08/2018---> 12/09/2018  Flagyl 12/08/2018---> 12/08/2018  Rocephin 12/09/2018--->  Subjective:   [name] reports that he feels better than he did yesterday.  He has defervesced overnight.  Denies chest pain.  Reports that he had some shortness of breath over the past few days, but currently is not feeling short of breath.  Has an occasional nonproductive cough.  No nausea, vomiting or diarrhea.  Reports that he recently trimmed a callus on his right first metatarsal head.  Right lower extremity hot and mildly tender.  Objective:    Vitals:   12/09/18 0106 12/09/18 0145 12/09/18 0200 12/09/18 0653  BP:  117/67 110/71   Pulse:  79 83   Resp:  20 (!) 22   Temp: (!) 102.3 F (39.1 C)   97.9 F (36.6 C)  TempSrc:      SpO2:  96% 97%     Intake/Output Summary (Last 24 hours) at 12/09/2018 2353 Last data filed at 12/09/2018 0149 Gross per 24 hour  Intake 1200 ml  Output 300 ml  Net 900 ml   There were no vitals filed for  this visit.  Exam: General: Elderly gentleman in no acute distress. Cardiovascular: Heart sounds show a regular rate, and rhythm. No gallops or rubs. No murmurs. No JVD. Lungs: Diminished in the bases but clear. No rales, rhonchi or wheezes. Abdomen: Soft, nontender, nondistended with normal active bowel sounds. No masses. No hepatosplenomegaly. Neurological: Alert and oriented 3.  Nonfocal. Skin: Warm and dry.  Right foot with intense erythema and a streak.  Pictured below for reference. Extremities: No clubbing or cyanosis. No edema. Pedal pulses 2+. Psychiatric: Mood and affect are normal. Insight and judgment are normal.       Data Reviewed:   I have personally reviewed following labs and imaging studies:  Labs: Labs show the following:   Basic Metabolic Panel: Recent Labs  Lab 12/08/18 2353 12/09/18 0327  NA 136 138  K 4.6 4.6  CL 105 109  CO2 17* 17*  GLUCOSE 171* 165*  BUN 30* 29*  CREATININE 2.04* 1.98*  CALCIUM 8.8* 8.2*   GFR Estimated Creatinine Clearance: 34.1 mL/min (A) (by C-G formula based on SCr of 1.98 mg/dL (H)). Liver Function Tests: Recent Labs  Lab 12/08/18 2353 12/09/18 0327  AST 18 16  ALT 17 16  ALKPHOS 44 44  BILITOT 0.9 0.9  PROT 6.6 5.9*  ALBUMIN 3.6 3.1*   Coagulation profile Recent Labs  Lab 12/08/18 2353  INR 1.4*    CBC: Recent Labs  Lab 12/08/18  2353 12/09/18 0327  WBC 1.5* 1.5*  NEUTROABS 0.9*  --   HGB 12.3* 11.3*  HCT 36.3* 34.2*  MCV 96.5 98.3  PLT 118* 106*   Sepsis Labs: Recent Labs  Lab 12/08/18 2353 12/09/18 0327  WBC 1.5* 1.5*  LATICACIDVEN 1.1 0.9    Microbiology Recent Results (from the past 240 hour(s))  SARS Coronavirus 2 [facility] order, Performed in [facility] hospital lab) Nasopharyngeal Nasopharyngeal Swab     Status: None   Collection Time: 12/09/18 12:24 AM   Specimen: Nasopharyngeal Swab  Result Value Ref Range Status   SARS Coronavirus 2 NEGATIVE NEGATIVE Final    Comment:  (NOTE) If result is NEGATIVE SARS-CoV-2 target nucleic acids are NOT DETECTED. The SARS-CoV-2 RNA is generally detectable in upper and lower  respiratory specimens during the acute phase of infection. The lowest  concentration of SARS-CoV-2 viral copies this assay can detect is 250  copies / mL. A negative result does not preclude SARS-CoV-2 infection  and should not be used as the sole basis for treatment or other  patient management decisions.  A negative result may occur with  improper specimen collection / handling, submission of specimen other  than nasopharyngeal swab, presence of viral mutation(s) within the  areas targeted by this assay, and inadequate number of viral copies  (<250 copies / mL). A negative result must be combined with clinical  observations, patient history, and epidemiological information. If result is POSITIVE SARS-CoV-2 target nucleic acids are DETECTED. The SARS-CoV-2 RNA is generally detectable in upper and lower  respiratory specimens dur ing the acute phase of infection.  Positive  results are indicative of active infection with SARS-CoV-2.  Clinical  correlation with patient history and other diagnostic information is  necessary to determine patient infection status.  Positive results do  not rule out bacterial infection or co-infection with other viruses. If result is PRESUMPTIVE POSTIVE SARS-CoV-2 nucleic acids MAY BE PRESENT.   A presumptive positive result was obtained on the submitted specimen  and confirmed on repeat testing.  While 2019 novel coronavirus  (SARS-CoV-2) nucleic acids may be present in the submitted sample  additional confirmatory testing may be necessary for epidemiological  and / or clinical management purposes  to differentiate between  SARS-CoV-2 and other Sarbecovirus currently known to infect humans.  If clinically indicated additional testing with an alternate test  methodology [phone]) is advised. The SARS-CoV-2 RNA is  generally  detectable in upper and lower respiratory sp ecimens during the acute  phase of infection. The expected result is Negative. Fact Sheet for Patients:  StrictlyIdeas.no Fact Sheet for Healthcare Providers: BankingDealers.co.za This test is not yet approved or cleared by the [place] FDA and has been authorized for detection and/or diagnosis of SARS-CoV-2 by FDA under an Emergency Use Authorization (EUA).  This EUA will remain in effect (meaning this test can be used) for the duration of the COVID-19 declaration under Section 564(b)(1) of the Act, 21 U.S.C. section 360bbb-3(b)(1), unless the authorization is terminated or revoked sooner. Performed at [facility], [place] [address] [place], [place] [address]     Procedures and diagnostic studies:  Ct Head Wo Contrast  Result Date: 12/09/2018 CLINICAL DATA:  Altered level of consciousness, sepsis EXAM: CT HEAD WITHOUT CONTRAST TECHNIQUE: Contiguous axial images were obtained from the base of the skull through the vertex without intravenous contrast. COMPARISON:  Most recent comparison CT September 18, 2018 FINDINGS: Brain: A previously described 4 mm enhancing lesion in the  right cerebellum is not well seen in the absence of contrast on today's study. There is a redemonstrated focus of encephalomalacia in the anterior left frontal lobe with associated ex vacuo dilatation of the anterior horn of the left lateral ventricle. No new intracranial lesions are seen. No evidence of acute infarction, hemorrhage, hydrocephalus, extra-axial collection or mass lesion/mass effect. Symmetric prominence of the ventricles, cisterns and sulci compatible with parenchymal volume loss. Patchy areas of white matter hypoattenuation are most compatible with chronic microvascular angiopathy. Vascular: Atherosclerotic calcification of the carotid siphons and intradural vertebral arteries. Skull: Remote left  pterional craniotomy changes are again seen. Few prominent venous lakes are unchanged from prior. No calvarial fracture or suspicious osseous lesion. No scalp swelling or hematoma. Sinuses/Orbits: Minimal mural thickening in the left maxillary, sphenoid and ethmoid sinuses. Some pneumatized secretions are present within the ethmoids as well. Bilateral lens extractions are noted. Left scleral buckle is seen. Orbits are otherwise unremarkable. Other: None IMPRESSION: 1. No acute intracranial abnormality. 2. A previously described 4 mm enhancing lesion in the right cerebellum is not well seen in the absence of contrast on today's study. No new intracranial lesions are seen. Electronically Signed   By: Lovena Le M.D.   On: 12/09/2018 02:29   Dg Chest Port 1 View  Result Date: 12/09/2018 CLINICAL DATA:  Sepsis EXAM: PORTABLE CHEST 1 VIEW COMPARISON:  Radiograph 05/24/2017, CT chest 09/01/2015 FINDINGS: Postsurgical changes related to prior CABG including intact and aligned sternotomy wires and multiple surgical clips projecting over the mediastinum. Three lead pacer battery pack overlies the left chest wall with leads in the right atrium, cardiac apex and coronary sinus. Cardiac silhouette is enlarged accounting for the portable technique. The aorta is calcified. There is tortuosity of the right brachiocephalic vessels as well. Calcifications noted at the carotid bifurcations. Lung volumes are diminished. Streaky opacities are present in the lung bases with more focal right infrahilar opacity. Pulmonary vascularity is somewhat cephalized. IMPRESSION: Findings are compatible with CHF/volume overload with mild edema and cardiomegaly in this patient with history of CABG. Early infectious process could have a similar appearance, particularly in the lung bases, and should be excluded on a clinical basis. Electronically Signed   By: Lovena Le M.D.   On: 12/09/2018 00:16   Dg Foot Complete Right  Result Date:  12/09/2018 CLINICAL DATA:  Possible sepsis, redness on foot EXAM: RIGHT FOOT COMPLETE - 3+ VIEW COMPARISON:  None. FINDINGS: Postsurgical changes from prior first tarsometatarsal arthrodesis secured by 2 fully threaded screws. An additional fully threaded screw at the anterior process of the calcaneus, likely related to prior ORIF, correlate with surgical history. Marked hallux valgus deformity across the first metatarsophalangeal joint with hypertrophic change. There is remote posttraumatic change of the second proximal middle phalanges with posttraumatic fusion. Additional fusion is seen across the fourth middle and distal phalanges. There is diffuse soft tissue swelling and vascular calcification. No convincing features of osteomyelitis such as cortical destruction, periostitis or erosive change though evaluation is limited by the background of extensive degenerative features throughout the foot. There is a small linear radiodensity in the soft tissues adjacent the base of the fifth proximal phalanx which could reflect a small foreign body. No subcutaneous gas. IMPRESSION: 1. No convincing features of osteomyelitis though evaluation is limited by the background of extensive degenerative changes and postsurgical features throughout the foot. 2. Small linear radiodensity in the soft tissues adjacent to the base of the fifth proximal phalanx which could reflect a  small foreign body. Electronically Signed   By: Lovena Le M.D.   On: 12/09/2018 00:22   Ct Renal Stone Study  Result Date: 12/09/2018 CLINICAL DATA:  Altered level of consciousness, sepsis EXAM: CT ABDOMEN AND PELVIS WITHOUT CONTRAST TECHNIQUE: Multidetector CT imaging of the abdomen and pelvis was performed following the standard protocol without IV contrast. COMPARISON:  Chest radiograph December 08, 2018, PET-CT September 18, 2018 FINDINGS: Lower chest: Minimal atelectatic changes in the lung bases. 3 lead pacer wires are better visualized on scout  radiography. Normal heart size. No pericardial effusion. Hepatobiliary: No focal liver abnormality is seen. No gallstones, gallbladder wall thickening, or biliary dilatation. Pancreas: Mild atrophy of the pancreas. No peripancreatic inflammation or ductal dilatation. Spleen: Normal in size without focal abnormality. Adrenals/Urinary Tract: Normal adrenal glands. Bilateral mild renal atrophy with cortical scarring in both kidneys. Mild bilateral nonspecific perinephric stranding, a nonspecific finding though may correlate with either age or decreased renal function. No visible urolithiasis. No hydronephrosis. No concerning renal masses. Urinary bladder is unremarkable. Stomach/Bowel: Distal esophagus, stomach and duodenal sweep are unremarkable. No bowel wall thickening or dilatation. No evidence of obstruction. Cecum is partially displaced into the right upper quadrant. Extensive pancolonic diverticulosis is present without focal pericolonic inflammatory features to suggest diverticulitis. Vascular/Lymphatic: Atherosclerotic plaque is present throughout the aorta. There is mild fusiform dilatation of the infrarenal abdominal aorta up to 3.1 cm in maximal diameter. Mild ectasia of the common iliacs is noted as well measuring up to 2.3 cm on the left and 2.1 cm on the right. No suspicious or enlarged lymph nodes in the included lymphatic chains. Reproductive: The prostate and seminal vesicles are unremarkable. Other: No abdominopelvic free fluid or free gas. No bowel containing hernias. Small fat containing inguinal hernias are noted. Small fat containing umbilical hernia is present. Musculoskeletal: Multilevel degenerative changes are present in the imaged portions of the spine. There is focal levocurvature at L1-2 with partial fusion across the L1-2 levels. Discogenic and facet degenerative changes result in at least mild canal stenosis at L2-3. Moderate to severe foraminal narrowing is seen bilaterally at L4-5. No  acute osseous abnormality or suspicious osseous lesion. IMPRESSION: 1. No acute intra-abdominal process. Specifically no evidence of urolithiasis or hydronephrosis. 2. Mild bilateral renal atrophy. 3. Extensive pancolonic diverticulosis without evidence of diverticulitis. 4. Mild fusiform dilatation of the infrarenal abdominal aorta up to 3.1 cm in maximal diameter. Mild ectasia of the common iliacs bilaterally. Recommend followup by ultrasound in 3 years. This recommendation follows ACR consensus guidelines: White Paper of the ACR Incidental Findings Committee II on Vascular Findings. J Am Coll Radiol 2013; 10:789-794. Aortic aneurysm NOS (ICD10-I71.9) 5. Aortic Atherosclerosis (ICD10-I70.0). Electronically Signed   By: Lovena Le M.D.   On: 12/09/2018 02:37    Medications:    sodium chloride flush  3 mL Intravenous Q12H   Continuous Infusions:  sodium chloride     ceFEPime (MAXIPIME) IV     vancomycin       LOS: 0 days    Prolonged services: I spent an additional 30 minutes in the care of this patient with greater than 50% of the time in direct patient contact and on face time with daughter at the bedside to clarify elements of the patient's history.  Time in: 7:50 AM, time out, 8:20 AM.  Jacquelynn Cree  [facility] Pager [phone].   *Please refer to [email or web address], password [email or web address] to get updated schedule on who will round on this patient, as hospitalists  switch teams weekly. If 7PM-7AM, please contact night-coverage at [email or web address], password [email or web address] for any overnight needs.  12/09/2018, 7:38 AM

## 2018-12-09 NOTE — H&P (Addendum)
TRH H&P    Patient Demographics:    David [name], is a 78 y.o. male  MRN: [id number]  DOB - 1941-04-01  Admit Date - 12/08/2018  Referring MD/NP/PA:   [place]  Outpatient Primary MD for the patient is [name], MD  Patient coming from:  home  Chief complaint-  Fever, AMS   HPI:    David [name]  is a 78 y.o. male, w hypothyroidism, hypertension, hyperlipidemia, CADs/p CABG, h/o CVA, Melanoma with brain metastasis, ICH, recent leukopenia/ thrombocytopenia, apparently presents with fever 104 earlier yesterday as well as generalized weakness.  Pt thinks might have slight dysuria. Denies cough, cp, palp, sob, n/v, abd pain, diarrhea, brbpr, black stool.  Pt has had slight confusion w fever per his daughter.   In ED,   T 102.9, P 95 R 25, Bp 138/74  Pox 97% on RA  Wbc 1.5, Hgb 12.3, Plt 118 Na 136, K 4.6, Bun 30, Creatinine 2.04 Glucose 171 Ast 18, Alt 17, Alk phos 44, T. Bili 0.9 INR 1.4, PTT 39  ESR 34  CT brain IMPRESSION: 1. No acute intracranial abnormality. 2. A previously described 4 mm enhancing lesion in the right cerebellum is not well seen in the absence of contrast on today's study. No new intracranial lesions are seen.  CXR IMPRESSION: Findings are compatible with CHF/volume overload with mild edema and cardiomegaly in this patient with history of CABG.  Early infectious process could have a similar appearance, particularly in the lung bases, and should be excluded on a clinical Basis.  CT renal stone IMPRESSION: 1. No acute intra-abdominal process. Specifically no evidence of urolithiasis or hydronephrosis. 2. Mild bilateral renal atrophy. 3. Extensive pancolonic diverticulosis without evidence of diverticulitis. 4. Mild fusiform dilatation of the infrarenal abdominal aorta up to 3.1 cm in maximal diameter. Mild ectasia of the common iliacs bilaterally.  Recommend followup by ultrasound in 3 years. This recommendation follows ACR consensus guidelines: White Paper of the ACR Incidental Findings Committee II on Vascular Findings. J Am Coll Radiol 2013; 10:789-794. Aortic aneurysm NOS (ICD10-I71.9) 5. Aortic Atherosclerosis (ICD10-I70.0).  Blood culture x2 pending  Pt given vanco iv, cefepime iv in ED,   Pt will be admitted for sepsis, secondary to UTI vs Right foot cellulitis and AMS.     Review of systems:    In addition to the HPI above,    No Headache, No changes with Vision or hearing, No problems swallowing food or Liquids, No Chest pain, Cough or Shortness of Breath, No Abdominal pain, No Nausea or Vomiting, bowel movements are regular, No Blood in stool or Urine, No dysuria, No new skin rashes or bruises, No new joints pains-aches,  No new weakness, tingling, numbness in any extremity, No recent weight gain or loss, No polyuria, polydypsia or polyphagia, No significant Mental Stressors.  All other systems reviewed and are negative.    Past History of the following :    Past Medical History:  Diagnosis Date  . Anginal pain ([place])   . Arthritis    mostly hands  .  Benign familial hematuria   . BPH (benign prostatic hypertrophy)   . Brain cancer ([place])    melanoma with brain met  . Coronary artery disease   . GERD (gastroesophageal reflux disease)   . High cholesterol   . Hypertension   . Hypothyroidism   . Intracerebral hemorrhage ([place])   . Melanoma ([place])   . Metastatic melanoma to lung ([place])   . Mood disorder ([place])   . Myocardial infarction ([place])   . Pacemaker    due to syncope, 3rd degree HB (upgrade to [facility] Jude CRT-P 02/25/13 (Dr. Uvaldo Rising)  . Rectal bleed    due to NSAIDS  . Renal disorder   . Skin cancer    melanoma  . Sleep apnea    uses CPAP  . Stroke [facility])       Past Surgical History:  Procedure Laterality Date  . APPLICATION OF CRANIAL NAVIGATION N/A 10/15/2015   Procedure: APPLICATION OF  CRANIAL NAVIGATION;  Surgeon: Erline Levine, MD;  Location: [place] [facility];  Service: Neurosurgery;  Laterality: N/A;  . CARDIAC CATHETERIZATION  2013  . CARDIAC SURGERY     bypass X 2  . COLONOSCOPY WITH PROPOFOL N/A 05/08/2016   diverticulosis, int hem, no f/u needed [name])  . CORONARY ARTERY BYPASS GRAFT  2013   LIMA-LAD, SVG-PDA 03/27/11 (Dr. Francee Gentile)  . CRANIOTOMY N/A 10/15/2015   Procedure: LEFT FRONTAL CRANIOTOMY TUMOR EXCISION with Curve;  Surgeon: Erline Levine, MD;  Location: [place] [facility];  Service: Neurosurgery;  Laterality: N/A;  CRANIOTOMY TUMOR EXCISION  . EYE SURGERY    . FOOT SURGERY  08/2010  . JOINT REPLACEMENT Left    partial knee  . KNEE ARTHROSCOPY  12/2008  . LYMPH NODE BIOPSY    . PACEMAKER INSERTION    . TONSILLECTOMY        Social History:      Social History   Tobacco Use  . Smoking status: Former Smoker    Packs/day: 1.00    Years: 3.00    Pack years: 3.00    Quit date: 04/10/1957    Years since quitting: 61.7  . Smokeless tobacco: Never Used  Substance Use Topics  . Alcohol use: Yes    Alcohol/week: 14.0 standard drinks    Types: 10 Glasses of wine, 4 Cans of beer per week       Family History :     Family History  Problem Relation Age of Onset  . Cancer Mother 32       lung   . Hypertension Mother   . Hypertension Father   . Heart attack Father   . Heart disease Father   . Rheum arthritis Sister   . Cancer Maternal Grandmother        Home Medications:   Prior to Admission medications   Medication Sig Start Date End Date Taking? Authorizing Provider  acetaminophen (TYLENOL) 500 MG tablet Take 500 mg by mouth See admin instructions. Take 1 tablet three times daily as needed for pain and 1 tablet every night at bedtime   Yes [provider]  amLODipine (NORVASC) 5 MG tablet TAKE ONE TABLET BY MOUTH EVERY DAY Patient taking differently: Take 5 mg by mouth daily.  01/02/18  Yes [name], MD  aspirin EC 81 MG tablet Take  81 mg by mouth daily.   Yes [provider]  Cholecalciferol (VITAMIN D3) 1000 units CAPS Take 1 capsule (1,000 Units total) by mouth daily. 12/24/17  Yes [name], MD  citalopram (CELEXA) 10 MG tablet Take 1 tablet (10 mg total) by mouth daily. 10/07/18  Yes [name], MD  levothyroxine (SYNTHROID, LEVOTHROID) 137 MCG tablet TAKE 1 TABLET BY MOUTH ONCE DAILY BEFORE BREAKFAST Patient taking differently: Take 137 mcg by mouth daily before breakfast.  12/24/17  Yes [name], MD  methylphenidate (RITALIN) 10 MG tablet Take 1 tablet (10 mg total) by mouth daily. Patient taking differently: Take 10 mg by mouth daily as needed (when needing to concentrate).  04/01/18  Yes Vaslow, Acey Lav, MD  metoprolol succinate (TOPROL-XL) 50 MG 24 hr tablet TAKE 1 AND 1/2 TABLET (75 MG) BY MOUTH EVERY DAY WITH OR IMMEDIATELY FOLLOWING A MEAL *DO NOT CRUSH* 01/02/18  Yes [name], MD  rosuvastatin (CRESTOR) 20 MG tablet TAKE ONE TABLET BY MOUTH AT BEDTIME Patient taking differently: Take 20 mg by mouth daily.  11/08/17  Yes Minna Merritts, MD  traZODone (DESYREL) 50 MG tablet Take 0.5-1 tablets (25-50 mg total) by mouth at bedtime. 10/14/18  Yes [name], MD  Turmeric 500 MG CAPS Take 2 capsules by mouth daily.   Yes [provider]  vitamin E (VITAMIN E) 400 UNIT capsule Take 1 capsule (400 Units total) by mouth 2 (two) times daily. 07/24/16  Yes Hayden Pedro, PA-C  cephALEXin (KEFLEX) 500 MG capsule Take 1 capsule (500 mg total) by mouth 3 (three) times daily. Patient not taking: Reported on 12/09/2018 11/15/18   Jinny Sanders, MD  fluocinonide cream (LIDEX) 0.05 % Apply topically 2 (two) times daily as needed. Patient not taking: Reported on 12/09/2018 05/07/18   [name], MD  ketoconazole (NIZORAL) 2 % shampoo Apply 1 application topically 2 (two) times a week. Patient not taking: Reported on 12/09/2018 03/28/18   [name], MD  pentoxifylline  (TRENTAL) 400 MG CR tablet Take 1 tablet (400 mg total) by mouth 2 (two) times daily. Patient not taking: Reported on 12/09/2018 07/24/16   Hayden Pedro, PA-C  predniSONE (DELTASONE) 20 MG tablet 3 tabs by mouth daily x 3 days, then 2 tabs by mouth daily x 2 days then 1 tab by mouth daily x 2 days Patient not taking: Reported on 12/09/2018 11/15/18   Jinny Sanders, MD  testosterone cypionate (DEPOTESTOTERONE CYPIONATE) 100 MG/ML injection Inject 1 mL (100 mg total) into the muscle every 14 (fourteen) days. Patient not taking: Reported on 12/09/2018 10/10/18   [name], MD     Allergies:     Allergies  Allergen Reactions  . Ezetimibe Other (See Comments)    unknown  . Simvastatin Other (See Comments)    Reaction:  Gave pt a fever  Fever - temp of 103.   In 2003     Physical Exam:   Vitals  Blood pressure 117/67, pulse 79, temperature (!) 102.3 F (39.1 C), resp. rate 20, SpO2 96 %.  1.  General: axoxo3 (person, place , month)  2. Psychiatric: euthymic  3. Neurologic: cn2-12 intact, reflexes 2+ symmetric, diffuse with no clonus, motor 5/5 in all 4 ext  4. HEENMT:  Anicteric, pupils 1.71m symmetric, direct, consensual, near intact Neck: supple, no jvd Negative kernig, negative brudzinski  5. Respiratory : Slight decrease bs right lung base, no wheezing, no crackles.   6. Cardiovascular : rrr s1, s2,   7. Gastrointestinal:  Abd: soft, nt, nd, +bs  8. Skin:  Ext: no c/c/e,  Redness over the dorsum of the R foot with slight red streak up the medial aspect  of the ankle  9.Musculoskeletal:  Good ROM,  No adenopathy    Data Review:    CBC Recent Labs  Lab 12/08/18 2353  WBC 1.5*  HGB 12.3*  HCT 36.3*  PLT 118*  MCV 96.5  MCH 32.7  MCHC 33.9  RDW 14.8  LYMPHSABS 0.2*  MONOABS 0.1  EOSABS 0.1  BASOSABS 0.0   ------------------------------------------------------------------------------------------------------------------  Results for  orders placed or performed during the hospital encounter of 12/08/18 (from the past 48 hour(s))  Lactic acid, plasma     Status: None   Collection Time: 12/08/18 11:53 PM  Result Value Ref Range   Lactic Acid, Venous 1.1 0.5 - 1.9 mmol/L    Comment: Performed at [facility], [place] [address] [place], [place] [address]  Comprehensive metabolic panel     Status: Abnormal   Collection Time: 12/08/18 11:53 PM  Result Value Ref Range   Sodium 136 135 - 145 mmol/L   Potassium 4.6 3.5 - 5.1 mmol/L   Chloride 105 98 - 111 mmol/L   CO2 17 (L) 22 - 32 mmol/L   Glucose, Bld 171 (H) 70 - 99 mg/dL   BUN 30 (H) 8 - 23 mg/dL   Creatinine, Ser 2.04 (H) 0.61 - 1.24 mg/dL   Calcium 8.8 (L) 8.9 - 10.3 mg/dL   Total Protein 6.6 6.5 - 8.1 g/dL   Albumin 3.6 3.5 - 5.0 g/dL   AST 18 15 - 41 U/L   ALT 17 0 - 44 U/L   Alkaline Phosphatase 44 38 - 126 U/L   Total Bilirubin 0.9 0.3 - 1.2 mg/dL   GFR calc non Af Amer 30 (L) >60 mL/min   GFR calc Af Amer 35 (L) >60 mL/min   Anion gap 14 5 - 15    Comment: Performed at [place] [place] [address], [place], [place] [address]  CBC WITH DIFFERENTIAL     Status: Abnormal   Collection Time: 12/08/18 11:53 PM  Result Value Ref Range   WBC 1.5 (L) 4.0 - 10.5 K/uL   RBC 3.76 (L) 4.22 - 5.81 MIL/uL   Hemoglobin 12.3 (L) 13.0 - 17.0 g/dL   HCT 36.3 (L) 39.0 - 52.0 %   MCV 96.5 80.0 - 100.0 fL   MCH 32.7 26.0 - 34.0 pg   MCHC 33.9 30.0 - 36.0 g/dL   RDW 14.8 11.5 - 15.5 %   Platelets 118 (L) 150 - 400 K/uL    Comment: REPEATED TO VERIFY PLATELET COUNT CONFIRMED BY SMEAR SPECIMEN CHECKED FOR CLOTS Immature Platelet Fraction may be clinically indicated, consider ordering this additional test [id number]    nRBC 1.4 (H) 0.0 - 0.2 %   Neutrophils Relative % 58 %   Neutro Abs 0.9 (L) 1.7 - 7.7 K/uL   Lymphocytes Relative 16 %   Lymphs Abs 0.2 (L) 0.7 - 4.0 K/uL   Monocytes Relative 10 %   Monocytes Absolute 0.1 0.1 - 1.0 K/uL   Eosinophils Relative  3 %   Eosinophils Absolute 0.1 0.0 - 0.5 K/uL   Basophils Relative 0 %   Basophils Absolute 0.0 0.0 - 0.1 K/uL   WBC Morphology MILD LEFT SHIFT (1-5% METAS, OCC MYELO, OCC BANDS)    Immature Granulocytes 13 %   Abs Immature Granulocytes 0.19 (H) 0.00 - 0.07 K/uL    Comment: Performed at [facility], [place] [address], [place], [place] [address]  APTT     Status: Abnormal   Collection Time:  12/08/18 11:53 PM  Result Value Ref Range   aPTT 39 (H) 24 - 36 seconds    Comment:        IF BASELINE aPTT IS ELEVATED, SUGGEST PATIENT RISK ASSESSMENT BE USED TO DETERMINE APPROPRIATE ANTICOAGULANT THERAPY. Performed at [facility], [place] [address], [place], [place] [address]   Protime-INR     Status: Abnormal   Collection Time: 12/08/18 11:53 PM  Result Value Ref Range   Prothrombin Time 16.7 (H) 11.4 - 15.2 seconds   INR 1.4 (H) 0.8 - 1.2    Comment: (NOTE) INR goal varies based on device and disease states. Performed at [facility], [place] [address] [place], [place] [address]   Sedimentation rate     Status: Abnormal   Collection Time: 12/08/18 11:53 PM  Result Value Ref Range   Sed Rate 34 (H) 0 - 16 mm/hr    Comment: Performed at [facility] [address], [place], [place] [address]  C-reactive protein     Status: Abnormal   Collection Time: 12/08/18 11:53 PM  Result Value Ref Range   CRP 10.4 (H) <1.0 mg/dL    Comment: Performed at [facility], [place] [address] [place], [place] [address]  Troponin I (High Sensitivity)     Status: Abnormal   Collection Time: 12/08/18 11:53 PM  Result Value Ref Range   Troponin I (High Sensitivity) 28 (H) <18 ng/L    Comment: (NOTE) Elevated high sensitivity troponin I (hsTnI) values and significant  changes across serial measurements may suggest ACS but many other  chronic and acute conditions are known to elevate hsTnI results.  Refer to the "Links" section for chest pain algorithms and additional  guidance.  Performed at [facility], [place] [address], [place], [place] [address]   SARS Coronavirus 2 [facility] order, Performed in [facility] hospital lab) Nasopharyngeal Nasopharyngeal Swab     Status: None   Collection Time: 12/09/18 12:24 AM   Specimen: Nasopharyngeal Swab  Result Value Ref Range   SARS Coronavirus 2 NEGATIVE NEGATIVE    Comment: (NOTE) If result is NEGATIVE SARS-CoV-2 target nucleic acids are NOT DETECTED. The SARS-CoV-2 RNA is generally detectable in upper and lower  respiratory specimens during the acute phase of infection. The lowest  concentration of SARS-CoV-2 viral copies this assay can detect is 250  copies / mL. A negative result does not preclude SARS-CoV-2 infection  and should not be used as the sole basis for treatment or other  patient management decisions.  A negative result may occur with  improper specimen collection / handling, submission of specimen other  than nasopharyngeal swab, presence of viral mutation(s) within the  areas targeted by this assay, and inadequate number of viral copies  (<250 copies / mL). A negative result must be combined with clinical  observations, patient history, and epidemiological information. If result is POSITIVE SARS-CoV-2 target nucleic acids are DETECTED. The SARS-CoV-2 RNA is generally detectable in upper and lower  respiratory specimens dur ing the acute phase of infection.  Positive  results are indicative of active infection with SARS-CoV-2.  Clinical  correlation with patient history and other diagnostic information is  necessary to determine patient infection status.  Positive results do  not rule out bacterial infection or co-infection with other viruses. If result is PRESUMPTIVE POSTIVE SARS-CoV-2 nucleic acids MAY BE PRESENT.   A presumptive positive result was obtained on the submitted specimen  and confirmed on  repeat testing.  While 2019 novel coronavirus  (SARS-CoV-2) nucleic acids may be present in  the submitted sample  additional confirmatory testing may be necessary for epidemiological  and / or clinical management purposes  to differentiate between  SARS-CoV-2 and other Sarbecovirus currently known to infect humans.  If clinically indicated additional testing with an alternate test  methodology [phone]) is advised. The SARS-CoV-2 RNA is generally  detectable in upper and lower respiratory sp ecimens during the acute  phase of infection. The expected result is Negative. Fact Sheet for Patients:  StrictlyIdeas.no Fact Sheet for Healthcare Providers: BankingDealers.co.za This test is not yet approved or cleared by the [place] FDA and has been authorized for detection and/or diagnosis of SARS-CoV-2 by FDA under an Emergency Use Authorization (EUA).  This EUA will remain in effect (meaning this test can be used) for the duration of the COVID-19 declaration under Section 564(b)(1) of the Act, 21 U.S.C. section 360bbb-3(b)(1), unless the authorization is terminated or revoked sooner. Performed at [facility], [place] [address], [place], [place] [address]   Urinalysis, Routine w reflex microscopic     Status: Abnormal   Collection Time: 12/09/18  1:25 AM  Result Value Ref Range   Color, Urine YELLOW YELLOW   APPearance HAZY (A) CLEAR   Specific Gravity, Urine 1.015 1.005 - 1.030   pH 6.0 5.0 - 8.0   Glucose, UA NEGATIVE NEGATIVE mg/dL   Hgb urine dipstick LARGE (A) NEGATIVE   Bilirubin Urine NEGATIVE NEGATIVE   Ketones, ur NEGATIVE NEGATIVE mg/dL   Protein, ur >=300 (A) NEGATIVE mg/dL   Nitrite NEGATIVE NEGATIVE   Leukocytes,Ua NEGATIVE NEGATIVE   RBC / HPF >50 (H) 0 - 5 RBC/hpf   WBC, UA 6-10 0 - 5 WBC/hpf   Bacteria, UA RARE (A) NONE SEEN   Squamous Epithelial / LPF 0-5 0 - 5   Mucus PRESENT    Hyaline Casts, UA PRESENT     Comment: Performed at [facility], [place] [address] [place], [place] [address]     Chemistries  Recent Labs  Lab 12/08/18 2353  NA 136  K 4.6  CL 105  CO2 17*  GLUCOSE 171*  BUN 30*  CREATININE 2.04*  CALCIUM 8.8*  AST 18  ALT 17  ALKPHOS 44  BILITOT 0.9   ------------------------------------------------------------------------------------------------------------------  ------------------------------------------------------------------------------------------------------------------ GFR: Estimated Creatinine Clearance: 33.1 mL/min (A) (by C-G formula based on SCr of 2.04 mg/dL (H)). Liver Function Tests: Recent Labs  Lab 12/08/18 2353  AST 18  ALT 17  ALKPHOS 44  BILITOT 0.9  PROT 6.6  ALBUMIN 3.6   No results for input(s): LIPASE, AMYLASE in the last 168 hours. No results for input(s): AMMONIA in the last 168 hours. Coagulation Profile: Recent Labs  Lab 12/08/18 2353  INR 1.4*   Cardiac Enzymes: No results for input(s): CKTOTAL, CKMB, CKMBINDEX, TROPONINI in the last 168 hours. BNP (last 3 results) No results for input(s): PROBNP in the last 8760 hours. HbA1C: No results for input(s): HGBA1C in the last 72 hours. CBG: No results for input(s): GLUCAP in the last 168 hours. Lipid Profile: No results for input(s): CHOL, HDL, LDLCALC, TRIG, CHOLHDL, LDLDIRECT in the last 72 hours. Thyroid Function Tests: No results for input(s): TSH, T4TOTAL, FREET4, T3FREE, THYROIDAB in the last 72 hours. Anemia Panel: No results for input(s): VITAMINB12, FOLATE, FERRITIN, TIBC, IRON, RETICCTPCT in the last 72 hours.  --------------------------------------------------------------------------------------------------------------- Urine analysis:    Component Value Date/Time   COLORURINE YELLOW  12/09/2018 0125   APPEARANCEUR HAZY (A) 12/09/2018 0125   LABSPEC 1.015 12/09/2018 0125   PHURINE 6.0 12/09/2018 0125   GLUCOSEU NEGATIVE 12/09/2018 0125   HGBUR LARGE (A) 12/09/2018 0125   BILIRUBINUR NEGATIVE 12/09/2018 0125   KETONESUR NEGATIVE 12/09/2018  0125   PROTEINUR >=300 (A) 12/09/2018 0125   NITRITE NEGATIVE 12/09/2018 0125   LEUKOCYTESUR NEGATIVE 12/09/2018 0125      Imaging Results:    Dg Chest Port 1 View  Result Date: 12/09/2018 CLINICAL DATA:  Sepsis EXAM: PORTABLE CHEST 1 VIEW COMPARISON:  Radiograph 05/24/2017, CT chest 09/01/2015 FINDINGS: Postsurgical changes related to prior CABG including intact and aligned sternotomy wires and multiple surgical clips projecting over the mediastinum. Three lead pacer battery pack overlies the left chest wall with leads in the right atrium, cardiac apex and coronary sinus. Cardiac silhouette is enlarged accounting for the portable technique. The aorta is calcified. There is tortuosity of the right brachiocephalic vessels as well. Calcifications noted at the carotid bifurcations. Lung volumes are diminished. Streaky opacities are present in the lung bases with more focal right infrahilar opacity. Pulmonary vascularity is somewhat cephalized. IMPRESSION: Findings are compatible with CHF/volume overload with mild edema and cardiomegaly in this patient with history of CABG. Early infectious process could have a similar appearance, particularly in the lung bases, and should be excluded on a clinical basis. Electronically Signed   By: Lovena Le M.D.   On: 12/09/2018 00:16   Dg Foot Complete Right  Result Date: 12/09/2018 CLINICAL DATA:  Possible sepsis, redness on foot EXAM: RIGHT FOOT COMPLETE - 3+ VIEW COMPARISON:  None. FINDINGS: Postsurgical changes from prior first tarsometatarsal arthrodesis secured by 2 fully threaded screws. An additional fully threaded screw at the anterior process of the calcaneus, likely related to prior ORIF, correlate with surgical history. Marked hallux valgus deformity across the first metatarsophalangeal joint with hypertrophic change. There is remote posttraumatic change of the second proximal middle phalanges with posttraumatic fusion. Additional fusion is seen across  the fourth middle and distal phalanges. There is diffuse soft tissue swelling and vascular calcification. No convincing features of osteomyelitis such as cortical destruction, periostitis or erosive change though evaluation is limited by the background of extensive degenerative features throughout the foot. There is a small linear radiodensity in the soft tissues adjacent the base of the fifth proximal phalanx which could reflect a small foreign body. No subcutaneous gas. IMPRESSION: 1. No convincing features of osteomyelitis though evaluation is limited by the background of extensive degenerative changes and postsurgical features throughout the foot. 2. Small linear radiodensity in the soft tissues adjacent to the base of the fifth proximal phalanx which could reflect a small foreign body. Electronically Signed   By: Lovena Le M.D.   On: 12/09/2018 00:22       Assessment & Plan:    Active Problems:   Altered mental status   Sepsis ([place])   Fever  Sepsis (fever, rr>20, leukopenia) ? Secondary to acute lower uti vs cellulitis Blood culture x2 Urine culture vanco iv, cefepime iv pharmacy to dose  AMS / secondary to sepsis CT brain negative Check b12, esr, tsh, rpr  Pancytopenia  Check cbc in am Consider heme-onc consult if worseing   Hypothyroidism Cont Synthroid  Elevated troponin Check troponin Check cardiac echo  Hyperlipidemia, CAD s/p CABG, h/o CVA Cont Crestor 47m po qhs Cont Metoprolol -XL 592mpo qday Cont Amlodipine 74m19mo qday Cont Aspirin 10m44m qhs Cont Trental  ARF Hydrate with  ns iv Check cmp in am  ADD? Cont Ritalin  Depression Cont Celexa 33m po qday  Hypogonadism Hold  testosterone   DVT Prophylaxis-   Lovenox - SCDs   AM Labs Ordered, also please review Full Orders  Family Communication: Admission, patients condition and plan of care including tests being ordered have been discussed with the patient and daughter who indicate understanding  and agree with the plan and Code Status.  Code Status:  FULL CODE, notified daughter of admission (note she is a Ob-gyn)  Admission status:  Inpatient: Based on patients clinical presentation and evaluation of above clinical data, I have made determination that patient meets Inpatient criteria at this time.   Pt will require iv abx for sepsis, pt has cellulitis and uti as source of sepsis.  Pt has high risk of clinical deterioration.  Pt will require > 2 nites stay.   Time spent in minutes : 70   JJani GravelM.D on 12/09/2018 at 2:20 AM

## 2018-12-09 NOTE — ED Notes (Signed)
Pt returned from ct

## 2018-12-09 NOTE — ED Notes (Signed)
Critical care writing orders

## 2018-12-09 NOTE — ED Notes (Signed)
RN Gerald Stabs got the second set of blood cultures with IV start

## 2018-12-09 NOTE — ED Notes (Signed)
To ct

## 2018-12-09 NOTE — Progress Notes (Signed)
Pharmacy Antibiotic Note  Rebekah Sprinkle. is a 78 y.o. male admitted on 12/08/2018 with fever/altered mental status.  Pharmacy has been consulted for Vancomycin/Cefepime dosing for r/o sepsis. Recently diagnosed prostate cancer. WBC is actually on the low side. Noted renal dysfunction.   Plan: Vancomycin 1250 mg IV q24h >>Estimated AUC: 512 Cefepime 2g IV q12h Trend WBC, temp, renal function  F/U infectious work-up Drug levels as indicated  Temp (24hrs), Avg:102.6 F (39.2 C), Min:102.3 F (39.1 C), Max:102.9 F (39.4 C)  Recent Labs  Lab 12/08/18 2353  WBC 1.5*  CREATININE 2.04*  LATICACIDVEN 1.1    Estimated Creatinine Clearance: 33.1 mL/min (A) (by C-G formula based on SCr of 2.04 mg/dL (H)).    Allergies  Allergen Reactions  . Ezetimibe Other (See Comments)    unknown  . Simvastatin Other (See Comments)    Reaction:  Gave pt a fever  Fever - temp of 103.   In 2003   Narda Bonds, PharmD, [facility] Clinical Pharmacist Phone: [phone]

## 2018-12-09 NOTE — ED Notes (Signed)
Pt along with an elevated temp during yesterday came from an assisted living in [place].  A family member checked the pts temp prior to arrival and found it to  Be 104.1  On his arrival here the pt was given 1000mg  of tylenol po  .  Rt foot red on the top of his foot and there are red streaks going upward toward his knee  Hx of gout    Pt alert and oriented  Skin hot and dry     .

## 2018-12-09 NOTE — ED Notes (Signed)
Patient [name] would like a call back, states she spoke to [facility] and would like for her to call her.  [phone]

## 2018-12-09 NOTE — Progress Notes (Signed)
  Echocardiogram 2D Echocardiogram has been performed.  David [name] 12/09/2018, 2:41 PM

## 2018-12-09 NOTE — ED Notes (Signed)
Pt sleeping soundly vitals look good

## 2018-12-09 NOTE — Evaluation (Signed)
Clinical/Bedside Swallow Evaluation Patient Details  Name: David [name]. MRN: [id number] Date of Birth: [birth year]  Today's Date: 12/09/2018 Time: SLP Start Time (ACUTE ONLY): 35 SLP Stop Time (ACUTE ONLY): 1113 SLP Time Calculation (min) (ACUTE ONLY): 10 min  Past Medical History:  Past Medical History:  Diagnosis Date  . Anginal pain ([place])   . Arthritis    mostly hands  . Benign familial hematuria   . BPH (benign prostatic hypertrophy)   . Brain cancer ([place])    melanoma with brain met  . Coronary artery disease   . GERD (gastroesophageal reflux disease)   . High cholesterol   . Hypertension   . Hypothyroidism   . Intracerebral hemorrhage ([place])   . Melanoma ([place])   . Metastatic melanoma to lung ([place])   . Mood disorder ([place])   . Myocardial infarction ([place])   . Pacemaker    due to syncope, 3rd degree HB (upgrade to [facility]. Jude CRT-P 02/25/13 (Dr. Uvaldo Rising)  . Rectal bleed    due to NSAIDS  . Renal disorder   . Sepsis ([place]) 12/09/2018  . Skin cancer    melanoma  . Sleep apnea    uses CPAP  . Stroke [facility])    Past Surgical History:  Past Surgical History:  Procedure Laterality Date  . APPLICATION OF CRANIAL NAVIGATION N/A 10/15/2015   Procedure: APPLICATION OF CRANIAL NAVIGATION;  Surgeon: Erline Levine, MD;  Location: [place] [facility];  Service: Neurosurgery;  Laterality: N/A;  . CARDIAC CATHETERIZATION  2013  . CARDIAC SURGERY     bypass X 2  . COLONOSCOPY WITH PROPOFOL N/A 05/08/2016   diverticulosis, int hem, no f/u needed [name])  . CORONARY ARTERY BYPASS GRAFT  2013   LIMA-LAD, SVG-PDA 03/27/11 (Dr. Francee Gentile)  . CRANIOTOMY N/A 10/15/2015   Procedure: LEFT FRONTAL CRANIOTOMY TUMOR EXCISION with Curve;  Surgeon: Erline Levine, MD;  Location: [place] [facility];  Service: Neurosurgery;  Laterality: N/A;  CRANIOTOMY TUMOR EXCISION  . EYE SURGERY    . FOOT SURGERY  08/2010  . JOINT REPLACEMENT Left    partial knee  . KNEE ARTHROSCOPY  12/2008  . LYMPH NODE BIOPSY    .  PACEMAKER INSERTION    . TONSILLECTOMY     HPI:  David [name]  is a 79 y.o. male, w hypothyroidism, hypertension, hyperlipidemia, CADs/p CABG, h/o CVA, Melanoma with brain metastasis, ICH, recent leukopenia/ thrombocytopenia, apparently presents with fever 104 earlier yesterday as well as generalized weakness.  Pt thinks might have slight dysuria. Denies cough, cp, palp, sob, n/v, abd pain, diarrhea, brbpr, black stool.  Pt has had slight confusion w fever per his daughter.  CXR 8/30: "Findings are compatible with CHF/volume overload with mild edema and cardiomegaly in this patient with history of CABG. Early infectious process could have a similar appearance, particularly in the lung bases, and should be excluded on a clinical basis."  Head CT 8/31: "IMPRESSION: 1. No acute intracranial abnormality. 2. A previously described 4 mm enhancing lesion in the right cerebellum is not well seen in the absence of contrast on today's study. No new intracranial lesions are seen."   Assessment / Plan / Recommendation Clinical Impression  Pt presents with functional swallowing as assessed clinically.  Pt tolerated all consistencies trialed with no clinical s/s of aspiration and exhibited good oral clearance of solids.  Aside from abnormal CXR, which may be due to volume overload/CHF, there is no indication of aspiration and speech will sign off at this time  based on clinical presentation.  If MD is concerned for silent aspiration, please place orders for MBSS. SLP Visit Diagnosis: Dysphagia, unspecified (R13.10)    Aspiration Risk  No limitations    Diet Recommendation Regular;Thin liquid   Liquid Administration via: Straw;Cup Medication Administration: Whole meds with liquid Supervision: Patient able to self feed Compensations: (General precautions) Postural Changes: Seated upright at 90 degrees    Other  Recommendations Oral Care Recommendations: Oral care BID   Follow up Recommendations None       Frequency and Duration   N/A         Prognosis   N/A     Swallow Study   General Date of Onset: 12/08/18 HPI: David [name]  is a 78 y.o. male, w hypothyroidism, hypertension, hyperlipidemia, CADs/p CABG, h/o CVA, Melanoma with brain metastasis, ICH, recent leukopenia/ thrombocytopenia, apparently presents with fever 104 earlier yesterday as well as generalized weakness.  Pt thinks might have slight dysuria. Denies cough, cp, palp, sob, n/v, abd pain, diarrhea, brbpr, black stool.  Pt has had slight confusion w fever per his daughter.  CXR 8/30: "Findings are compatible with CHF/volume overload with mild edema and cardiomegaly in this patient with history of CABG. Early infectious process could have a similar appearance, particularly in the lung bases, and should be excluded on a clinical basis."  Head CT 8/31: "IMPRESSION: 1. No acute intracranial abnormality. 2. A previously described 4 mm enhancing lesion in the right cerebellum is not well seen in the absence of contrast on today's study. No new intracranial lesions are seen." Type of Study: Bedside Swallow Evaluation Previous Swallow Assessment: none Diet Prior to this Study: Regular;Thin liquids Temperature Spikes Noted: No Respiratory Status: Room air History of Recent Intubation: No Behavior/Cognition: Alert;Cooperative;Pleasant mood Oral Cavity Assessment: Within Functional Limits Oral Care Completed by SLP: No Oral Cavity - Dentition: Adequate natural dentition Vision: Functional for self-feeding Self-Feeding Abilities: Able to feed self Patient Positioning: Upright in chair Baseline Vocal Quality: Normal Volitional Cough: Strong Volitional Swallow: Able to elicit    Oral/Motor/Sensory Function Overall Oral Motor/Sensory Function: Within functional limits   Ice Chips Ice chips: Not tested   Thin Liquid Thin Liquid: Within functional limits Presentation: Straw    Nectar Thick Nectar Thick Liquid: Not tested   Honey  Thick Honey Thick Liquid: Not tested   Puree Puree: Within functional limits   Solid     Solid: Within functional limits      David [name], [place], [place] Office: [phone]; Pager (8/31): [phone] 12/09/2018,11:21 AM

## 2018-12-10 ENCOUNTER — Inpatient Hospital Stay (HOSPITAL_COMMUNITY): Payer: Medicare Other

## 2018-12-10 ENCOUNTER — Other Ambulatory Visit: Payer: Self-pay | Admitting: Urology

## 2018-12-10 DIAGNOSIS — I25118 Atherosclerotic heart disease of native coronary artery with other forms of angina pectoris: Secondary | ICD-10-CM

## 2018-12-10 DIAGNOSIS — E039 Hypothyroidism, unspecified: Secondary | ICD-10-CM

## 2018-12-10 DIAGNOSIS — D709 Neutropenia, unspecified: Secondary | ICD-10-CM

## 2018-12-10 DIAGNOSIS — R5081 Fever presenting with conditions classified elsewhere: Secondary | ICD-10-CM

## 2018-12-10 LAB — CBC WITH DIFFERENTIAL/PLATELET
Abs Immature Granulocytes: 0.36 10*3/uL — ABNORMAL HIGH (ref 0.00–0.07)
Basophils Absolute: 0 10*3/uL (ref 0.0–0.1)
Basophils Relative: 1 %
Eosinophils Absolute: 0 10*3/uL (ref 0.0–0.5)
Eosinophils Relative: 2 %
HCT: 34.7 % — ABNORMAL LOW (ref 39.0–52.0)
Hemoglobin: 11.9 g/dL — ABNORMAL LOW (ref 13.0–17.0)
Immature Granulocytes: 17 %
Lymphocytes Relative: 12 %
Lymphs Abs: 0.3 10*3/uL — ABNORMAL LOW (ref 0.7–4.0)
MCH: 32.6 pg (ref 26.0–34.0)
MCHC: 34.3 g/dL (ref 30.0–36.0)
MCV: 95.1 fL (ref 80.0–100.0)
Monocytes Absolute: 0.2 10*3/uL (ref 0.1–1.0)
Monocytes Relative: 8 %
Neutro Abs: 1.3 10*3/uL — ABNORMAL LOW (ref 1.7–7.7)
Neutrophils Relative %: 60 %
Platelets: 129 10*3/uL — ABNORMAL LOW (ref 150–400)
RBC: 3.65 MIL/uL — ABNORMAL LOW (ref 4.22–5.81)
RDW: 14.8 % (ref 11.5–15.5)
WBC: 2.1 10*3/uL — ABNORMAL LOW (ref 4.0–10.5)
nRBC: 0 % (ref 0.0–0.2)

## 2018-12-10 LAB — BASIC METABOLIC PANEL
Anion gap: 15 (ref 5–15)
BUN: 34 mg/dL — ABNORMAL HIGH (ref 8–23)
CO2: 18 mmol/L — ABNORMAL LOW (ref 22–32)
Calcium: 8.5 mg/dL — ABNORMAL LOW (ref 8.9–10.3)
Chloride: 104 mmol/L (ref 98–111)
Creatinine, Ser: 2.09 mg/dL — ABNORMAL HIGH (ref 0.61–1.24)
GFR calc Af Amer: 34 mL/min — ABNORMAL LOW (ref >60)
GFR calc non Af Amer: 29 mL/min — ABNORMAL LOW (ref >60)
Glucose, Bld: 193 mg/dL — ABNORMAL HIGH (ref 70–99)
Potassium: 4.1 mmol/L (ref 3.5–5.1)
Sodium: 137 mmol/L (ref 135–145)

## 2018-12-10 LAB — URINE CULTURE: Culture: NO GROWTH

## 2018-12-10 LAB — SEDIMENTATION RATE: Sed Rate: 26 mm/hr — ABNORMAL HIGH (ref 0–16)

## 2018-12-10 LAB — C-REACTIVE PROTEIN: CRP: 14.9 mg/dL — ABNORMAL HIGH (ref <1.0)

## 2018-12-10 LAB — URIC ACID: Uric Acid, Serum: 7.6 mg/dL (ref 3.7–8.6)

## 2018-12-10 MED ORDER — TBO-FILGRASTIM 480 MCG/0.8ML ~~LOC~~ SOSY
480.0000 ug | PREFILLED_SYRINGE | Freq: Once | SUBCUTANEOUS | Status: AC
  Administered 2018-12-10: 480 ug via SUBCUTANEOUS
  Filled 2018-12-10: qty 0.8

## 2018-12-10 MED ORDER — SODIUM CHLORIDE 0.9 % IV SOLN
2.0000 g | INTRAVENOUS | Status: DC
  Administered 2018-12-11 – 2018-12-12 (×2): 2 g via INTRAVENOUS
  Filled 2018-12-10 (×2): qty 20

## 2018-12-10 MED ORDER — SODIUM CHLORIDE 0.9 % IV SOLN
1.0000 g | Freq: Once | INTRAVENOUS | Status: AC
  Administered 2018-12-10: 1 g via INTRAVENOUS
  Filled 2018-12-10: qty 10

## 2018-12-10 MED ORDER — PREDNISONE 20 MG PO TABS
40.0000 mg | ORAL_TABLET | Freq: Every day | ORAL | Status: AC
  Administered 2018-12-10 – 2018-12-12 (×3): 40 mg via ORAL
  Filled 2018-12-10 (×3): qty 2

## 2018-12-10 NOTE — Progress Notes (Addendum)
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 When seen today, he reports that he is feeling better.  He is still having intermittent fevers with T-max of 102.1 in the past 24 hours.  Had a recent gout flare in his left wrist which has improved.  Reports that right lower extremity erythema is improving.  Remains on IV antibiotics.  Blood cultures remain negative to date.  Urine culture with no growth.  Hoping to go home sometime tomorrow.   Oncology History Overview Note  Metastatic melanoma [facility])   Staging form: Melanoma of the Skin, AJCC 7th Edition     Clinical stage from 09/21/2015: Stage IV [place], [place], M1c) - Signed by [name], MD on 10/09/2015     Malignant melanoma ([place])  09/16/2015 Imaging   PET scan showed a hypermetabolic 3.1SH lingular nodule, and a 82m right upper lobe lung nodule, left subclavicular nodes, and bone metastasis in the proximal right humerus and right femur.    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 A new 6 mm hypodensity in the right cerebellar hemisphere with uncertainty enhancement may represent a metastasis or small brain parenchymal hemorrhage.    01/27/2016 - 01/27/2016 Radiation Therapy   01/27/16 SRS Treatment:  20 Gy in 1 fraction to a 6 mm right cerebellar brain met    02/2016 - 05/24/2018 Chemotherapy   Monthly Xgeva starting 02/2016, changed to every 3 months on 01/30/2018. Stopped on 05/24/2018   03/29/2016 PET scan   PET 03/29/2016 IMPRESSION: 1. Overall there has been an interval response to therapy. The index lesions sided on previous exam it are less hypermetabolic when compared with the previous exam. 2. There is an intramuscular lesion within the right masseter which has increased FDG uptake compared with previous exam. 3. No new sites of disease identified. 4. Aortic atherosclerosis and infrarenal abdominal aortic aneurysm. Recommend followup by ultrasound in 3 years. This recommendation follows ACR consensus guidelines: White Paper of the ACR Incidental Findings Committee II on Vascular Findings. J Joellyn Ruedadiol 2013; 10:789-794   04/20/2016 Imaging   CT Head w wo contrast 1. Mild patchy enhancement at the left anterior frontal lobe treatment site (series 11, image 39) without mass effect and stable surrounding white matter hypodensity without strong evidence of acute vasogenic edema. Recommend continued CT surveillance. 2. No new metastatic disease or new intracranial abnormality identified. 3. Probable benign 14 mm left parietal bone lucent lesion,  unchanged since May 2017.   05/06/2016 - 05/08/2016 Hospital Admission   Pt was admitted for rectal bleeding for one day. Breathing spontaneously resolved, hemoglobin dropped from 15 to 10.5, did not require blood transfusion. Colonoscopy showed no active bleeding, except  diverticulosis and hemorrhoid. He was discharged to home.   05/08/2016 Procedure   Colonoscopy  - Stool in the ascending colon. Fluid aspiration performed. - Diverticulosis in the entire examined colon. - Non-bleeding internal hemorrhoids. - The examination was otherwise normal on direct and retroflexion views.   05/29/2016 Imaging   PET Image Restaging IMPRESSION: 1. Stable exam with no evidence progression. 2. Photopenia in the LEFT frontal lobe at prior surgical site. 3. Stable mildly hypermetabolic RIGHT paratracheal node. This may represent a thyroid nodule. 4. Stable size of minimally metabolic lingular pulmonary nodule. 5. Stable skeletal lesion of the RIGHT iliac crest 6. No cutaneous metabolic lesion.   07/20/2016 Imaging   CT Head W WO Contrast IMPRESSION: Pronounced change in the left frontal region. Much more regional vasogenic edema. Relatively stable appearance of the abnormal enhancement extending from the lateral margin of the frontal horn of left lateral ventricle to the frontal brain surface. New region of abnormal enhancement surrounding the frontal horn of the left lateral ventricle measuring approximately 3.2 cm. The differential diagnosis is recurrent tumor versus radiation necrosis. The patient is reportedly not an MRI candidate. This area is large enough that CT perfusion could possibly make a reasonable differentiation.   09/02/2016 Imaging   CT Head 09/02/16 IMPRESSION: Somewhat mixed pattern of slight posterior progression of pleomorphic enhancement surrounding the LEFT lateral ventricle although no significant mass effect, and reduced vasogenic edema compared with the scan in April.   09/27/2016 - 11/02/2016 Chemotherapy   Start dabrafenib and trametinib 09/27/16, stopped due to severe fatigue     10/05/2016 Imaging   CT Head w & w/o contrast  IMPRESSION: 1. Decreased size of the enhancing area within the left frontal lobe treatment site.  Surrounding edema has also decreased. The findings support continued evolution of radiation necrosis. 2. No new enhancing lesions.   10/18/2016 PET scan   IMPRESSION: 1. New focal hypermetabolism in the proximal sigmoid colon with associated focal colonic wall thickening and mild pericolonic fat stranding at the site of a proximal sigmoid diverticulum, favored to represent mild acute sigmoid diverticulitis. Metastatic disease is considered less likely. Clinical correlation is necessary. Consider appropriate antibiotic therapy, as clinically warranted. Attention to this region recommended on future PET-CT follow-up. 2. Otherwise complete metabolic response with no evidence of hypermetabolic metastatic disease. Lingular pulmonary nodule is slightly decreased in size and demonstrates no significant metabolism. 3. No residual hypermetabolism in the right thyroid lobe nodule, which is stable in size. 4. Aortic Atherosclerosis (ICD10-I70.0). Stable 3.1 cm infrarenal abdominal aortic aneurysm. Aortic aneurysm NOS (ICD10-I71.9). Recommend followup by ultrasound in 3 years. This recommendation follows ACR consensus guidelines: White Paper of the ACR Incidental Findings Committee II on Vascular Findings. Joellyn Rued Radiol 2013; 93:734-287.   11/01/2016 - 11/04/2016 Hospital Admission   Patient admitted to the hospital due to hypercalcemia where he received IV fluids and pamidronate   12/13/2016 PET scan   PET 12/13/16 IMPRESSION: 1. No findings to suggest metastatic disease on today's examination. 2. Previously noted hypermetabolism in the sigmoid colon has resolved, presumably indicative of a focus of diverticulitis on the prior study. Today's study does demonstrate relatively diffuse hypermetabolism throughout the cecum, ascending colon and proximal transverse colon where there is some mild  mural thickening, favored to reflect mild chronic inflammation related to underlying diverticular  disease. No definite inflammatory changes on the CT portion of the examination to strongly suggest an active acute diverticulitis at this time. No discrete lesion to suggest neoplasm. 3. Previously noted 8 mm lingular nodule is stable in size and known remains non hypermetabolic. This is nonspecific but reassuring. Continued attention on future followup studies is recommended. 4. Aortic atherosclerosis, in addition to left main and 3 vessel coronary artery disease. Status post median sternotomy for CABG including LIMA to the LAD. In addition, there is mild fusiform aneurysmal dilatation of the infrarenal abdominal aorta (3.1 cm in diameter) as well as aneurysmal dilatation of the left common iliac   12/18/2016 - 12/31/2017 Chemotherapy   Restarted nivolamab 261m every 2 weeks, with tapering dose dexamethasone     04/18/2017 Imaging   PET Scan IMPRESSION: 1. No hypermetabolic lesion to suggest active malignancy. 2. Stable 8 mm lingular nodule is not currently hypermetabolic but merit surveillance. Infrarenal abdominal aortic aneurysm 3.1 cm in diameter. Recommend followup by [facility] 3 years. 3. Other imaging findings of potential clinical significance: Postoperative findings in the left frontal lobe. Right renal cyst. Aortic Atherosclerosis (ICD10-I70.0). Osteoarthritis. Lumbar scoliosis. Left knee effusion. Colonic diverticulosis.    10/10/2017 PET scan   IMPRESSION: 1. Small focus of FDG accumulation in the subcutaneous fat of the right gluteal region, nonspecific. Attention on follow-up suggested. 2. Otherwise stable exam without new or progressive hypermetabolic disease in the interval since prior PET-CT. 3. Stable 8 mm nodule in the lingula. No evidence for hypermetabolism on PET imaging. 4.  Aortic Atherosclerois (ICD10-170.0)    01/29/2018 PET scan   IMPRESSION: 1. Small focus of hypermetabolism in the lower left paratracheal neck, which appears to localize to the posterior left  thyroid lobe, without discrete thyroid nodule on the CT images, decreased in metabolism since 10/10/2017 PET-CT. This finding is nonspecific and may represent a hypermetabolic thyroid nodule. Consider thyroid ultrasound correlation. 2. No new hypermetabolic findings suspicious for metastatic disease. Previously described low level metabolism in the subcutaneous right gluteal region has resolved, compatible with resolved inflammatory focus. No evidence of recurrent hypermetabolism within the stable lingular pulmonary nodule. No evidence of recurrent osseous hypermetabolism. 3. Stable infrarenal 3.4 cm Abdominal Aortic Aneurysm (ICD10-I71.9). Recommend follow-up aortic ultrasound in 3 years. This recommendation follows ACR consensus guidelines: White Paper of the ACR Incidental Findings Committee II on Vascular Findings. J Am Coll Radiol 2013;; 65:465-035 4.  Aortic Atherosclerosis (ICD10-I70.0).   09/18/2018 PET scan   PET 09/18/18  IMPRESSION: 1. No findings of active malignancy. The known small right cerebellar mass is not readily apparent on PET-CT but was shown on today's CT head. 2. 3.2 cm infrarenal abdominal aortic aneurysm. Recommend followup by [facility] 3 years. This recommendation follows ACR consensus guidelines: White Paper of the ACR Incidental Findings Committee II on Vascular Findings. J Am Coll Radiol 2013; 10:789-794. Aortic aneurysm NOS (ICD10-I71.9). 3. Other imaging findings of potential clinical significance: Left frontal lobe encephalomalacia underlying the craniotomy site. Chronic paranasal sinusitis. Aortic Atherosclerosis (ICD10-I70.0). Coronary atherosclerosis. Mild cardiomegaly. Colonic diverticulosis. Dextroconvex lumbar scoliosis. Small left knee effusion with mild synovitis. Postoperative findings in the right foot.   09/18/2018 Imaging   CT head  IMPRESSION: 1. No evidence of new intracranial metastases or acute abnormality. 2. Unchanged 4 mm right  cerebellar metastasis. 3. Stable post treatment changes in the left frontal lobe.      REVIEW OF SYSTEMS:  Constitutional: Reports fevers.  Denies chills. Respiratory: Denies cough, dyspnea or wheezes Cardiovascular: Denies palpitation, chest discomfort Gastrointestinal:  Denies nausea, heartburn or change in bowel habits Skin: Reports redness to his right foot which is improving. Lymphatics: Denies new lymphadenopathy or easy bruising Neurological:Denies numbness, tingling or new weaknesses Behavioral/Psych: Mood is stable, no new changes  MSK: Left wrist pain improved. All other systems were reviewed with the patient and are negative.  I have reviewed the past medical history, past surgical history, social history and family history with the patient and they are unchanged from previous note.   PHYSICAL EXAMINATION: ECOG PERFORMANCE STATUS: 2 - Symptomatic, <50% confined to bed  Vitals:   12/10/18 0624 12/10/18 1000  BP:  133/72  Pulse:  81  Resp:  18  Temp: 99.4 F (37.4 C) (!) 100.9 F (38.3 C)  SpO2:  95%   Filed Weights   12/10/18 0349  Weight: 212 lb 4.9 oz (96.3 kg)    Intake/Output from previous day: 08/31 0701 - 09/01 0700 In: 240 [P.O.:240] Out: 650 [Urine:650]  GENERAL:alert, no distress and comfortable SKIN: Right lower extremity with mild erythema which is improving compared to the margins drawn LUNGS: clear to auscultation and percussion with normal breathing effort HEART: regular rate & rhythm and no murmurs and no lower extremity edema ABDOMEN:abdomen soft, non-tender and normal bowel sounds Musculoskeletal:no cyanosis of digits and no clubbing  NEURO: alert & oriented x 3 with fluent speech, no focal motor/sensory deficits  LABORATORY DATA:  I have reviewed the data as listed CMP Latest Ref Rng & Units 12/10/2018 12/09/2018 12/08/2018  Glucose 70 - 99 mg/dL 193(H) 165(H) 171(H)  BUN 8 - 23 mg/dL 34(H) 29(H) 30(H)  Creatinine 0.61 - 1.24 mg/dL  2.09(H) 1.98(H) 2.04(H)  Sodium 135 - 145 mmol/L 137 138 136  Potassium 3.5 - 5.1 mmol/L 4.1 4.6 4.6  Chloride 98 - 111 mmol/L 104 109 105  CO2 22 - 32 mmol/L 18(L) 17(L) 17(L)  Calcium 8.9 - 10.3 mg/dL 8.5(L) 8.2(L) 8.8(L)  Total Protein 6.5 - 8.1 g/dL - 5.9(L) 6.6  Total Bilirubin 0.3 - 1.2 mg/dL - 0.9 0.9  Alkaline Phos 38 - 126 U/L - 44 44  AST 15 - 41 U/L - 16 18  ALT 0 - 44 U/L - 16 17    Lab Results  Component Value Date   WBC 2.1 (L) 12/10/2018   HGB 11.9 (L) 12/10/2018   HCT 34.7 (L) 12/10/2018   MCV 95.1 12/10/2018   PLT 129 (L) 12/10/2018   NEUTROABS 1.3 (L) 12/10/2018    Ct Head Wo Contrast  Result Date: 12/09/2018 CLINICAL DATA:  Altered level of consciousness, sepsis EXAM: CT HEAD WITHOUT CONTRAST TECHNIQUE: Contiguous axial images were obtained from the base of the skull through the vertex without intravenous contrast. COMPARISON:  Most recent comparison CT September 18, 2018 FINDINGS: Brain: A previously described 4 mm enhancing lesion in the right cerebellum is not well seen in the absence of contrast on today's study. There is a redemonstrated focus of encephalomalacia in the anterior left frontal lobe with associated ex vacuo dilatation of the anterior horn of the left lateral ventricle. No new intracranial lesions are seen. No evidence of acute infarction, hemorrhage, hydrocephalus, extra-axial collection or mass lesion/mass effect. Symmetric prominence of the ventricles, cisterns and sulci compatible with parenchymal volume loss. Patchy areas of white matter hypoattenuation are most compatible with chronic microvascular angiopathy. Vascular: Atherosclerotic calcification of the carotid siphons and intradural vertebral arteries. Skull:  Remote left pterional craniotomy changes are again seen. Few prominent venous lakes are unchanged from prior. No calvarial fracture or suspicious osseous lesion. No scalp swelling or hematoma. Sinuses/Orbits: Minimal mural thickening in the left  maxillary, sphenoid and ethmoid sinuses. Some pneumatized secretions are present within the ethmoids as well. Bilateral lens extractions are noted. Left scleral buckle is seen. Orbits are otherwise unremarkable. Other: None IMPRESSION: 1. No acute intracranial abnormality. 2. A previously described 4 mm enhancing lesion in the right cerebellum is not well seen in the absence of contrast on today's study. No new intracranial lesions are seen. Electronically Signed   By: Lovena Le M.D.   On: 12/09/2018 02:29   Dg Chest Port 1 View  Result Date: 12/09/2018 CLINICAL DATA:  Sepsis EXAM: PORTABLE CHEST 1 VIEW COMPARISON:  Radiograph 05/24/2017, CT chest 09/01/2015 FINDINGS: Postsurgical changes related to prior CABG including intact and aligned sternotomy wires and multiple surgical clips projecting over the mediastinum. Three lead pacer battery pack overlies the left chest wall with leads in the right atrium, cardiac apex and coronary sinus. Cardiac silhouette is enlarged accounting for the portable technique. The aorta is calcified. There is tortuosity of the right brachiocephalic vessels as well. Calcifications noted at the carotid bifurcations. Lung volumes are diminished. Streaky opacities are present in the lung bases with more focal right infrahilar opacity. Pulmonary vascularity is somewhat cephalized. IMPRESSION: Findings are compatible with CHF/volume overload with mild edema and cardiomegaly in this patient with history of CABG. Early infectious process could have a similar appearance, particularly in the lung bases, and should be excluded on a clinical basis. Electronically Signed   By: Lovena Le M.D.   On: 12/09/2018 00:16   Dg Foot Complete Right  Result Date: 12/09/2018 CLINICAL DATA:  Possible sepsis, redness on foot EXAM: RIGHT FOOT COMPLETE - 3+ VIEW COMPARISON:  None. FINDINGS: Postsurgical changes from prior first tarsometatarsal arthrodesis secured by 2 fully threaded screws. An  additional fully threaded screw at the anterior process of the calcaneus, likely related to prior ORIF, correlate with surgical history. Marked hallux valgus deformity across the first metatarsophalangeal joint with hypertrophic change. There is remote posttraumatic change of the second proximal middle phalanges with posttraumatic fusion. Additional fusion is seen across the fourth middle and distal phalanges. There is diffuse soft tissue swelling and vascular calcification. No convincing features of osteomyelitis such as cortical destruction, periostitis or erosive change though evaluation is limited by the background of extensive degenerative features throughout the foot. There is a small linear radiodensity in the soft tissues adjacent the base of the fifth proximal phalanx which could reflect a small foreign body. No subcutaneous gas. IMPRESSION: 1. No convincing features of osteomyelitis though evaluation is limited by the background of extensive degenerative changes and postsurgical features throughout the foot. 2. Small linear radiodensity in the soft tissues adjacent to the base of the fifth proximal phalanx which could reflect a small foreign body. Electronically Signed   By: Lovena Le M.D.   On: 12/09/2018 00:22   Ct Renal Stone Study  Result Date: 12/09/2018 CLINICAL DATA:  Altered level of consciousness, sepsis EXAM: CT ABDOMEN AND PELVIS WITHOUT CONTRAST TECHNIQUE: Multidetector CT imaging of the abdomen and pelvis was performed following the standard protocol without IV contrast. COMPARISON:  Chest radiograph December 08, 2018, PET-CT September 18, 2018 FINDINGS: Lower chest: Minimal atelectatic changes in the lung bases. 3 lead pacer wires are better visualized on scout radiography. Normal heart size. No pericardial effusion. Hepatobiliary:  No focal liver abnormality is seen. No gallstones, gallbladder wall thickening, or biliary dilatation. Pancreas: Mild atrophy of the pancreas. No peripancreatic  inflammation or ductal dilatation. Spleen: Normal in size without focal abnormality. Adrenals/Urinary Tract: Normal adrenal glands. Bilateral mild renal atrophy with cortical scarring in both kidneys. Mild bilateral nonspecific perinephric stranding, a nonspecific finding though may correlate with either age or decreased renal function. No visible urolithiasis. No hydronephrosis. No concerning renal masses. Urinary bladder is unremarkable. Stomach/Bowel: Distal esophagus, stomach and duodenal sweep are unremarkable. No bowel wall thickening or dilatation. No evidence of obstruction. Cecum is partially displaced into the right upper quadrant. Extensive pancolonic diverticulosis is present without focal pericolonic inflammatory features to suggest diverticulitis. Vascular/Lymphatic: Atherosclerotic plaque is present throughout the aorta. There is mild fusiform dilatation of the infrarenal abdominal aorta up to 3.1 cm in maximal diameter. Mild ectasia of the common iliacs is noted as well measuring up to 2.3 cm on the left and 2.1 cm on the right. No suspicious or enlarged lymph nodes in the included lymphatic chains. Reproductive: The prostate and seminal vesicles are unremarkable. Other: No abdominopelvic free fluid or free gas. No bowel containing hernias. Small fat containing inguinal hernias are noted. Small fat containing umbilical hernia is present. Musculoskeletal: Multilevel degenerative changes are present in the imaged portions of the spine. There is focal levocurvature at L1-2 with partial fusion across the L1-2 levels. Discogenic and facet degenerative changes result in at least mild canal stenosis at L2-3. Moderate to severe foraminal narrowing is seen bilaterally at L4-5. No acute osseous abnormality or suspicious osseous lesion. IMPRESSION: 1. No acute intra-abdominal process. Specifically no evidence of urolithiasis or hydronephrosis. 2. Mild bilateral renal atrophy. 3. Extensive pancolonic  diverticulosis without evidence of diverticulitis. 4. Mild fusiform dilatation of the infrarenal abdominal aorta up to 3.1 cm in maximal diameter. Mild ectasia of the common iliacs bilaterally. Recommend followup by ultrasound in 3 years. This recommendation follows ACR consensus guidelines: White Paper of the ACR Incidental Findings Committee II on Vascular Findings. J Am Coll Radiol 2013; 10:789-794. Aortic aneurysm NOS (ICD10-I71.9) 5. Aortic Atherosclerosis (ICD10-I70.0). Electronically Signed   By: Lovena Le M.D.   On: 12/09/2018 02:37    ASSESSMENT AND PLAN: 1.  Pancytopenia 2.  Fevers/sepsis likely secondary to right lower extremity cellulitis 3.  Stage IV malignant melanoma with metastasis to the brain, lung, and bone, NED now, on observation  4.  Newly diagnosed prostate cancer 5.  Acute on chronic kidney disease  -Discussed lab findings with the patient.  The patient has pancytopenia of unclear etiology.  His total white blood cell count is 2.1 with an ANC of 1.3.  We will give a one-time dose of Granix 480 mcg today.  Adverse effects of this medication were discussed with the patient including possibility of bone pain.  He is agreeable to proceed.  Hemoglobin has improved to 11.9 and platelets have improved to 129,000.  No transfusion is indicated.  Repeat CBC with differential in the morning.  No further Granix would be indicated if his [place] is 1.5 or higher. -Continue IV antibiotics per hospitalist.  Continue to follow blood cultures. -Recommend bone scan to evaluate for bone metastases given his history of metastatic melanoma as well as diagnosis of prostate cancer. -Discussed proceeding with a bone marrow biopsy if counts do not improve.  We will plan to recheck his labs after discharge and revisit this discussion if his counts do not improve. -We are in the process of arranging  an appointment in our multidisciplinary prostate clinic. Message sent to Nurse Navigator.     LOS: 1 day    Mikey Bussing, DNP, AGPCNP-BC, AOCNP 12/10/18   I have seen the patient through a video virtual visit along with NP Erasmo Downer. I agree with the assessment and and plan and have edited the notes.   I have reviewed his recent lab and scan images in person. He developed moderate neutropenia and mild thrombocytopenia since June 2020, his PET scan in 09/2018 was negative for evidence of metastatic cancer, and his prostate was not hypermetabolic on PET. He was diagnosed with prostate cancer in early Aug 2020, with Gleason score 7. I would like to rule out metastatic bone disease from his prostate cancer and other primary bone marrow disease such as MDS. Will order bone scan, hopefully to be done before discharge, and bone marrow biopsy as outpt in 1-2 weeks in our clinic. I think his current infection is related to his neutropenia, will give granix to keep his ANC>1.5k. I will f/u as needed. I appreciate the excellent care from the hospitalist service, spoke with Dr. Tana Coast this morning.   [name]  12/10/2018

## 2018-12-10 NOTE — Evaluation (Signed)
Physical Therapy Evaluation Patient Details Name: David [name]. MRN: [id number] DOB: 02/17/1941 Today's Date: 12/10/2018   History of Present Illness  Pt is a 78 y/o male admitted secondary to neutropenia with fever and AMS. Pt with sepsis secondary to UTI vs cellulitis. CT of head negative for acute abnormality. 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   Clinical Impression  Pt admitted secondary to problem above with deficits below. Pt requiring min to mod A for mobility tasks using RW. Pt limited in tolerance secondary to fatigue. Pt currently resides in ILF with wife, however, wife is unable to physically assist pt. Will continue to follow acutely to maximize functional mobility independence and safety.     Follow Up Recommendations SNF    Equipment Recommendations  Rolling walker with 5" wheels    Recommendations for Other Services       Precautions / Restrictions Precautions Precautions: Fall Restrictions Weight Bearing Restrictions: No      Mobility  Bed Mobility Overal bed mobility: Needs Assistance Bed Mobility: Supine to Sit     Supine to sit: Mod assist     General bed mobility comments: Mod A for LE assist and trunk elevation. Increased time to come to EOB.   Transfers Overall transfer level: Needs assistance Equipment used: Rolling walker (2 wheeled);None Transfers: Sit to/from Stand Sit to Stand: Min assist;Mod assist;From elevated surface         General transfer comment: Required mod A to stand without AD. Pt reaching for sink to hold on to. Had pt return to sitting and used RW on 2 stand. Pt with increased stability and requiring min A for lift assist and steadying to stand. Cues for safe hand placement.   Ambulation/Gait Ambulation/Gait assistance: Min assist Gait Distance (Feet): 10 Feet Assistive device: Rolling walker (2  wheeled) Gait Pattern/deviations: Step-through pattern;Decreased stride length;Trunk flexed Gait velocity: Decreased   General Gait Details: Slow, unsteady gait. Required safety cues throughout for safe use of RW. Cues for proximity to device as well. Pt fatiguing easily and only able to tolerate short distance gait within the room.   Stairs            Wheelchair Mobility    Modified Rankin (Stroke Patients Only)       Balance Overall balance assessment: Needs assistance Sitting-balance support: No upper extremity supported;Feet supported Sitting balance-Leahy Scale: Fair     Standing balance support: Bilateral upper extremity supported;During functional activity Standing balance-Leahy Scale: Poor Standing balance comment: Reliant on BUE support                              Pertinent Vitals/Pain Pain Assessment: Faces Faces Pain Scale: No hurt    Home Living Family/patient expects to be discharged to:: Private residence Living Arrangements: Spouse/significant other Available Help at Discharge: Family;Available 24 hours/day Type of Home: House Home Access: Stairs to enter Entrance Stairs-Rails: Right Entrance Stairs-Number of Steps: 2 Home Layout: One level Home Equipment: None Additional Comments: Pt's wife unable to physically assist. Report they live at [place]     Prior Function Level of Independence: Independent               Hand Dominance        Extremity/Trunk Assessment   Upper Extremity Assessment Upper Extremity Assessment: Generalized  weakness    Lower Extremity Assessment Lower Extremity Assessment: Generalized weakness    Cervical / Trunk Assessment Cervical / Trunk Assessment: Kyphotic  Communication   Communication: No difficulties  Cognition Arousal/Alertness: Awake/alert Behavior During Therapy: WFL for tasks assessed/performed Overall Cognitive Status: No family/caregiver present to determine baseline  cognitive functioning                                 General Comments: Pt alert and oriented X3. Was unable to state why he was here. Pt demonstrated slowed processing throughout.       General Comments      Exercises     Assessment/Plan    PT Assessment Patient needs continued PT services  PT Problem List Decreased strength;Decreased balance;Decreased activity tolerance;Decreased mobility;Decreased knowledge of use of DME;Decreased knowledge of precautions       PT Treatment Interventions Gait training;DME instruction;Stair training;Functional mobility training;Therapeutic exercise;Therapeutic activities;Balance training;Patient/family education    PT Goals (Current goals can be found in the Care Plan section)  Acute Rehab PT Goals Patient Stated Goal: to feel better PT Goal Formulation: With patient Time For Goal Achievement: 12/24/18 Potential to Achieve Goals: Good    Frequency Min 2X/week   Barriers to discharge Decreased caregiver support      Co-evaluation               AM-PAC PT "6 Clicks" Mobility  Outcome Measure Help needed turning from your back to your side while in a flat bed without using bedrails?: A Lot Help needed moving from lying on your back to sitting on the side of a flat bed without using bedrails?: A Lot Help needed moving to and from a bed to a chair (including a wheelchair)?: A Little Help needed standing up from a chair using your arms (e.g., wheelchair or bedside chair)?: A Lot Help needed to walk in hospital room?: A Little Help needed climbing 3-5 steps with a railing? : A Lot 6 Click Score: 14    End of Session Equipment Utilized During Treatment: Gait belt Activity Tolerance: Patient limited by fatigue Patient left: in chair;with call bell/phone within reach;with chair alarm set Nurse Communication: Mobility status PT Visit Diagnosis: Unsteadiness on feet (R26.81);Muscle weakness (generalized) (M62.81)     Time: 0175-1025 PT Time Calculation (min) (ACUTE ONLY): 20 min   Charges:   PT Evaluation $PT Eval Moderate Complexity: [place], PT, DPT  Acute Rehabilitation Services  Pager: [phone] Office: [phone]   Rudean Hitt 12/10/2018, 1:28 PM

## 2018-12-10 NOTE — Progress Notes (Signed)
Patient has home CPAP at bedside.  RT tried assisting patient with setup however patient stated he did not want to wear it tonight.  RT will continue to monitor.

## 2018-12-10 NOTE — Progress Notes (Signed)
Yellow MEWS protocol initiated for temp 102.1. This is not an acute change. PRN Tylenol admin per MD order. Will f/u & continue to monitor.

## 2018-12-10 NOTE — Progress Notes (Addendum)
[facility]                                                                              Patient Demographics  David [name], is a 78 y.o. male, DOB - 06-06-1940, EXB:[id number]  Admit date - 12/08/2018   Admitting Physician Jani Gravel, MD  Outpatient Primary MD for the patient is [name], MD  Outpatient specialists:   LOS - 1  days   Medical records reviewed and are as summarized below:    Chief Complaint  Patient presents with   Hematuria       Brief summary   David [name]. is an 78 y.o. male with a PMH of hypothyroidism, hypertension, hyperlipidemia, CAD status post CABG, history of CVA, and melanoma with brain metastasis complicated by recent leukopenia, thrombocytopenia and ICH who was admitted 12/08/2018 for evaluation of fever and altered mental status associated with generalized weakness and ? mild dysuria.  In the ED, temperature was 102.9 and respiratory rate was 25.  BP was normotensive.  WBC was 1.5 with a hemoglobin of 12.3 and platelets 118.  Creatinine notable for 2.04.  Brain showed a previously described enhancing lesion but no new acute intracranial abnormalities.  Chest x-ray showed findings consistent with CHF/volume overload and mild edema.  CT of the abdomen with renal stone protocol showed no acute intra-abdominal process.  Cultures were obtained and the patient was placed on empiric vancomycin and cefepime.  Source of infection felt to be due to UTI versus right foot cellulitis.   Assessment & Plan    Principal Problem: Pancytopenia with neutropenia, febrile, sepsis likely secondary to right lower extremity nonpurulent cellulitis -Patient met sepsis criteria at the time of admission with fever 102.9 F, tachycardia, mild tachypnea, leukopenia, source likely right foot cellulitis.  Initially was presumed UTI however urine culture negative. -Initially received vancomycin, cefepime, Flagyl, antibiotics narrowed to Rocephin IV  on 8/31. -Patient continues to spike fevers, overnight Tmax 102.1 F, blood cultures remain negative, urine culture negative, COVID-19 test negative, will increase Rocephin to 2 g IV daily.  Discussed with pharmacy, Cefepime does not have strong gram-positive coverage for cellulitis.   -WBC 1.5 on 8/31, ordered CBC with differential, BMET today -Discussed with oncology, Dr. Burr Medico, will evaluate for further recommendation -Per daughter at the bedside, right foot cellulitis much improved from admission, appears to have improvement with the margins.  Left wrist pain -Patient also complaining of left wrist pain, has a prior history of gout, ordered ESR, CRP, uric acid, left wrist x-ray.  -  Has a prior history of gout with flare earlier in the month, seen by PCP on 8/7.  Per patient wrist pain had improved after short course of steroids. -ESR, CRP elevated, uric acid 7.6, left wrist x-ray still pending.  Will place on prednisone 40 mg daily for 3 days  Benign essential hypertension -BP currently stable, continue amlodipine, metoprolol  OSA on CPAP -Continue CPAP nightly  History of nontraumatic intracerebral hemorrhage affecting right dominant side with hemiparesis, aphasia -Stable, SLP evaluation done on 8/31, no aspiration issues -PT  evaluation ordered  Acute on chronic kidney disease, stage  III Baseline creatinine around 1.5, admitted with creatinine of 2.0 -Currently improving, 1.9 on 8/31, repeat bmet  Stage IV malignant melanoma with metastasis to brain, lung -Follows Dr. Burr Medico and [name] -Diagnosed in 2017, status post left frontal craniotomy, tumor excision, subsequently XRT to the brain and chemotherapy.  He has been on Xgeva for bone metastasis since 02/2016  Acquired hypothyroidism Continue Synthroid  Dyslipidemia -Continue Crestor   History of CAD, CABG, biventricular ICD in place -Continue aspirin, beta-blocker -Troponin 49, likely due to demand ischemia, follow 2D  echo result  Code Status: Full DVT Prophylaxis: SCDs secondary to thrombocytopenia Family Communication: Discussed all imaging results, lab results, explained to the patient and daughter at the bedside.    Disposition Plan: Will remain inpatient, still spiking fevers overnight, neutropenia, leukopenia.  Pending hematology evaluation.  Time Spent in minutes   35 minutes  Procedures:  None  Consultants:   Hematology oncology, Dr. Burr Medico  Antimicrobials:   Anti-infectives (From admission, onward)   Start     Dose/Rate Route Frequency Ordered Stop   12/09/18 2200  vancomycin (VANCOCIN) 1,250 mg in sodium chloride 0.9 % 250 mL IVPB  Status:  Discontinued     1,250 mg 166.7 mL/hr over 90 Minutes Intravenous Every 24 hours 12/09/18 0232 12/09/18 0838   12/09/18 1000  ceFEPIme (MAXIPIME) 2 g in sodium chloride 0.9 % 100 mL IVPB  Status:  Discontinued     2 g 200 mL/hr over 30 Minutes Intravenous Every 12 hours 12/09/18 0232 12/09/18 0838   12/09/18 0845  cefTRIAXone (ROCEPHIN) 1 g in sodium chloride 0.9 % 100 mL IVPB     1 g 200 mL/hr over 30 Minutes Intravenous Every 24 hours 12/09/18 0838     12/08/18 2330  ceFEPIme (MAXIPIME) 2 g in sodium chloride 0.9 % 100 mL IVPB     2 g 200 mL/hr over 30 Minutes Intravenous  Once 12/08/18 2329 12/09/18 0046   12/08/18 2330  metroNIDAZOLE (FLAGYL) IVPB 500 mg     500 mg 100 mL/hr over 60 Minutes Intravenous  Once 12/08/18 2329 12/09/18 0149   12/08/18 2330  vancomycin (VANCOCIN) IVPB 1000 mg/200 mL premix     1,000 mg 200 mL/hr over 60 Minutes Intravenous  Once 12/08/18 2329 12/09/18 0149          Medications  Scheduled Meds:  amLODipine  5 mg Oral Daily   aspirin EC  81 mg Oral Daily   citalopram  10 mg Oral Daily   levothyroxine  137 mcg Oral Q0600   metoprolol succinate  50 mg Oral Daily   rosuvastatin  20 mg Oral Daily   traZODone  25-50 mg Oral QHS   Continuous Infusions:  cefTRIAXone (ROCEPHIN)  IV 1 g (12/10/18  1003)   PRN Meds:.acetaminophen **OR** acetaminophen, methylphenidate      Subjective:   David [name] was seen and examined today.  Continues to spike fevers, overnight and earlier this morning 101.1 F.  Per patient his left wrist is also hurting, recently had acute gout flare.  Patient denies dizziness, chest pain, shortness of breath, abdominal pain, N/V/D/C.  Right lower extremity erythema improving.  Objective:   Vitals:   12/10/18 0349 12/10/18 0525 12/10/18 0624 12/10/18 1000  BP:  121/68  133/72  Pulse:  78  81  Resp:  16    Temp:  (!) 101.1 F (38.4 C) 99.4 F (37.4 C) (!) 100.9 F (38.3 C)  TempSrc:  Oral Oral Oral  SpO2:  93%  Weight: 96.3 kg       Intake/Output Summary (Last 24 hours) at 12/10/2018 1119 Last data filed at 12/10/2018 1000 Gross per 24 hour  Intake 240 ml  Output 900 ml  Net -660 ml     Wt Readings from Last 3 Encounters:  12/10/18 96.3 kg  11/28/18 93 kg  11/15/18 93.9 kg     Exam  General: Alert and oriented x 3, NAD  Eyes:  HEENT:  Atraumatic, normocephalic  Cardiovascular: S1 S2 auscultated, no murmurs, RRR  Respiratory: Clear to auscultation bilaterally, no wheezing, rales or rhonchi  Gastrointestinal: Soft, nontender, nondistended, + bowel sounds  Ext: no pedal edema bilaterally  Neuro: No new deficits  Musculoskeletal: No digital cyanosis, clubbing  Skin: Right lower extremity erythema improving from the margins drawn (also compared with the pictures from his daughter).  Psych: Normal affect and demeanor, alert and oriented x3    Data Reviewed:  I have personally reviewed following labs and imaging studies  Micro Results Recent Results (from the past 240 hour(s))  Blood Culture (routine x 2)     Status: None (Preliminary result)   Collection Time: 12/08/18 11:51 PM   Specimen: BLOOD  Result Value Ref Range Status   Specimen Description BLOOD LEFT ANTECUBITAL  Final   Special Requests   Final    BOTTLES  DRAWN AEROBIC AND ANAEROBIC Blood Culture adequate volume   Culture   Final    NO GROWTH 1 DAY Performed at [facility], [address], [place], [place] [address]    Report Status PENDING  Incomplete  Blood Culture (routine x 2)     Status: None (Preliminary result)   Collection Time: 12/08/18 11:53 PM   Specimen: BLOOD  Result Value Ref Range Status   Specimen Description BLOOD RIGHT ANTECUBITAL  Final   Special Requests   Final    BOTTLES DRAWN AEROBIC AND ANAEROBIC Blood Culture results may not be optimal due to an excessive volume of blood received in culture bottles   Culture   Final    NO GROWTH 1 DAY Performed at [facility], [place] [address] [place], [place] [address]    Report Status PENDING  Incomplete  SARS Coronavirus 2 [facility] order, Performed in [facility] hospital lab) Nasopharyngeal Nasopharyngeal Swab     Status: None   Collection Time: 12/09/18 12:24 AM   Specimen: Nasopharyngeal Swab  Result Value Ref Range Status   SARS Coronavirus 2 NEGATIVE NEGATIVE Final    Comment: (NOTE) If result is NEGATIVE SARS-CoV-2 target nucleic acids are NOT DETECTED. The SARS-CoV-2 RNA is generally detectable in upper and lower  respiratory specimens during the acute phase of infection. The lowest  concentration of SARS-CoV-2 viral copies this assay can detect is 250  copies / mL. A negative result does not preclude SARS-CoV-2 infection  and should not be used as the sole basis for treatment or other  patient management decisions.  A negative result may occur with  improper specimen collection / handling, submission of specimen other  than nasopharyngeal swab, presence of viral mutation(s) within the  areas targeted by this assay, and inadequate number of viral copies  (<250 copies / mL). A negative result must be combined with clinical  observations, patient history, and epidemiological information. If result is POSITIVE SARS-CoV-2 target nucleic acids are  DETECTED. The SARS-CoV-2 RNA is generally detectable in upper and lower  respiratory specimens dur ing the acute phase of infection.  Positive  results  are indicative of active infection with SARS-CoV-2.  Clinical  correlation with patient history and other diagnostic information is  necessary to determine patient infection status.  Positive results do  not rule out bacterial infection or co-infection with other viruses. If result is PRESUMPTIVE POSTIVE SARS-CoV-2 nucleic acids MAY BE PRESENT.   A presumptive positive result was obtained on the submitted specimen  and confirmed on repeat testing.  While 2019 novel coronavirus  (SARS-CoV-2) nucleic acids may be present in the submitted sample  additional confirmatory testing may be necessary for epidemiological  and / or clinical management purposes  to differentiate between  SARS-CoV-2 and other Sarbecovirus currently known to infect humans.  If clinically indicated additional testing with an alternate test  methodology [phone]) is advised. The SARS-CoV-2 RNA is generally  detectable in upper and lower respiratory sp ecimens during the acute  phase of infection. The expected result is Negative. Fact Sheet for Patients:  StrictlyIdeas.no Fact Sheet for Healthcare Providers: BankingDealers.co.za This test is not yet approved or cleared by the [place] FDA and has been authorized for detection and/or diagnosis of SARS-CoV-2 by FDA under an Emergency Use Authorization (EUA).  This EUA will remain in effect (meaning this test can be used) for the duration of the COVID-19 declaration under Section 564(b)(1) of the Act, 21 U.S.C. section 360bbb-3(b)(1), unless the authorization is terminated or revoked sooner. Performed at [facility], [place] [address], [place], [place] [address]   Urine Culture     Status: None   Collection Time: 12/09/18  1:27 AM   Specimen: Urine, Clean Catch    Result Value Ref Range Status   Specimen Description URINE, CLEAN CATCH  Final   Special Requests NONE  Final   Culture   Final    NO GROWTH Performed at [facility], [place] [address], [place], [place] [address]    Report Status 12/10/2018 FINAL  Final    Radiology Reports Ct Head Wo Contrast  Result Date: 12/09/2018 CLINICAL DATA:  Altered level of consciousness, sepsis EXAM: CT HEAD WITHOUT CONTRAST TECHNIQUE: Contiguous axial images were obtained from the base of the skull through the vertex without intravenous contrast. COMPARISON:  Most recent comparison CT September 18, 2018 FINDINGS: Brain: A previously described 4 mm enhancing lesion in the right cerebellum is not well seen in the absence of contrast on today's study. There is a redemonstrated focus of encephalomalacia in the anterior left frontal lobe with associated ex vacuo dilatation of the anterior horn of the left lateral ventricle. No new intracranial lesions are seen. No evidence of acute infarction, hemorrhage, hydrocephalus, extra-axial collection or mass lesion/mass effect. Symmetric prominence of the ventricles, cisterns and sulci compatible with parenchymal volume loss. Patchy areas of white matter hypoattenuation are most compatible with chronic microvascular angiopathy. Vascular: Atherosclerotic calcification of the carotid siphons and intradural vertebral arteries. Skull: Remote left pterional craniotomy changes are again seen. Few prominent venous lakes are unchanged from prior. No calvarial fracture or suspicious osseous lesion. No scalp swelling or hematoma. Sinuses/Orbits: Minimal mural thickening in the left maxillary, sphenoid and ethmoid sinuses. Some pneumatized secretions are present within the ethmoids as well. Bilateral lens extractions are noted. Left scleral buckle is seen. Orbits are otherwise unremarkable. Other: None IMPRESSION: 1. No acute intracranial abnormality. 2. A previously described 4 mm enhancing  lesion in the right cerebellum is not well seen in the absence of contrast on today's study. No new intracranial lesions are seen. Electronically Signed  By: Lovena Le M.D.   On: 12/09/2018 02:29   Dg Chest Port 1 View  Result Date: 12/09/2018 CLINICAL DATA:  Sepsis EXAM: PORTABLE CHEST 1 VIEW COMPARISON:  Radiograph 05/24/2017, CT chest 09/01/2015 FINDINGS: Postsurgical changes related to prior CABG including intact and aligned sternotomy wires and multiple surgical clips projecting over the mediastinum. Three lead pacer battery pack overlies the left chest wall with leads in the right atrium, cardiac apex and coronary sinus. Cardiac silhouette is enlarged accounting for the portable technique. The aorta is calcified. There is tortuosity of the right brachiocephalic vessels as well. Calcifications noted at the carotid bifurcations. Lung volumes are diminished. Streaky opacities are present in the lung bases with more focal right infrahilar opacity. Pulmonary vascularity is somewhat cephalized. IMPRESSION: Findings are compatible with CHF/volume overload with mild edema and cardiomegaly in this patient with history of CABG. Early infectious process could have a similar appearance, particularly in the lung bases, and should be excluded on a clinical basis. Electronically Signed   By: Lovena Le M.D.   On: 12/09/2018 00:16   Dg Foot Complete Right  Result Date: 12/09/2018 CLINICAL DATA:  Possible sepsis, redness on foot EXAM: RIGHT FOOT COMPLETE - 3+ VIEW COMPARISON:  None. FINDINGS: Postsurgical changes from prior first tarsometatarsal arthrodesis secured by 2 fully threaded screws. An additional fully threaded screw at the anterior process of the calcaneus, likely related to prior ORIF, correlate with surgical history. Marked hallux valgus deformity across the first metatarsophalangeal joint with hypertrophic change. There is remote posttraumatic change of the second proximal middle phalanges with  posttraumatic fusion. Additional fusion is seen across the fourth middle and distal phalanges. There is diffuse soft tissue swelling and vascular calcification. No convincing features of osteomyelitis such as cortical destruction, periostitis or erosive change though evaluation is limited by the background of extensive degenerative features throughout the foot. There is a small linear radiodensity in the soft tissues adjacent the base of the fifth proximal phalanx which could reflect a small foreign body. No subcutaneous gas. IMPRESSION: 1. No convincing features of osteomyelitis though evaluation is limited by the background of extensive degenerative changes and postsurgical features throughout the foot. 2. Small linear radiodensity in the soft tissues adjacent to the base of the fifth proximal phalanx which could reflect a small foreign body. Electronically Signed   By: Lovena Le M.D.   On: 12/09/2018 00:22   Ct Renal Stone Study  Result Date: 12/09/2018 CLINICAL DATA:  Altered level of consciousness, sepsis EXAM: CT ABDOMEN AND PELVIS WITHOUT CONTRAST TECHNIQUE: Multidetector CT imaging of the abdomen and pelvis was performed following the standard protocol without IV contrast. COMPARISON:  Chest radiograph December 08, 2018, PET-CT September 18, 2018 FINDINGS: Lower chest: Minimal atelectatic changes in the lung bases. 3 lead pacer wires are better visualized on scout radiography. Normal heart size. No pericardial effusion. Hepatobiliary: No focal liver abnormality is seen. No gallstones, gallbladder wall thickening, or biliary dilatation. Pancreas: Mild atrophy of the pancreas. No peripancreatic inflammation or ductal dilatation. Spleen: Normal in size without focal abnormality. Adrenals/Urinary Tract: Normal adrenal glands. Bilateral mild renal atrophy with cortical scarring in both kidneys. Mild bilateral nonspecific perinephric stranding, a nonspecific finding though may correlate with either age or  decreased renal function. No visible urolithiasis. No hydronephrosis. No concerning renal masses. Urinary bladder is unremarkable. Stomach/Bowel: Distal esophagus, stomach and duodenal sweep are unremarkable. No bowel wall thickening or dilatation. No evidence of obstruction. Cecum is partially displaced into the right upper  quadrant. Extensive pancolonic diverticulosis is present without focal pericolonic inflammatory features to suggest diverticulitis. Vascular/Lymphatic: Atherosclerotic plaque is present throughout the aorta. There is mild fusiform dilatation of the infrarenal abdominal aorta up to 3.1 cm in maximal diameter. Mild ectasia of the common iliacs is noted as well measuring up to 2.3 cm on the left and 2.1 cm on the right. No suspicious or enlarged lymph nodes in the included lymphatic chains. Reproductive: The prostate and seminal vesicles are unremarkable. Other: No abdominopelvic free fluid or free gas. No bowel containing hernias. Small fat containing inguinal hernias are noted. Small fat containing umbilical hernia is present. Musculoskeletal: Multilevel degenerative changes are present in the imaged portions of the spine. There is focal levocurvature at L1-2 with partial fusion across the L1-2 levels. Discogenic and facet degenerative changes result in at least mild canal stenosis at L2-3. Moderate to severe foraminal narrowing is seen bilaterally at L4-5. No acute osseous abnormality or suspicious osseous lesion. IMPRESSION: 1. No acute intra-abdominal process. Specifically no evidence of urolithiasis or hydronephrosis. 2. Mild bilateral renal atrophy. 3. Extensive pancolonic diverticulosis without evidence of diverticulitis. 4. Mild fusiform dilatation of the infrarenal abdominal aorta up to 3.1 cm in maximal diameter. Mild ectasia of the common iliacs bilaterally. Recommend followup by ultrasound in 3 years. This recommendation follows ACR consensus guidelines: White Paper of the ACR  Incidental Findings Committee II on Vascular Findings. J Am Coll Radiol 2013; 10:789-794. Aortic aneurysm NOS (ICD10-I71.9) 5. Aortic Atherosclerosis (ICD10-I70.0). Electronically Signed   By: Lovena Le M.D.   On: 12/09/2018 02:37    Lab Data:  CBC: Recent Labs  Lab 12/08/18 2353 12/09/18 0327  WBC 1.5* 1.5*  NEUTROABS 0.9*  --   HGB 12.3* 11.3*  HCT 36.3* 34.2*  MCV 96.5 98.3  PLT 118* 875*   Basic Metabolic Panel: Recent Labs  Lab 12/08/18 2353 12/09/18 0327  NA 136 138  K 4.6 4.6  CL 105 109  CO2 17* 17*  GLUCOSE 171* 165*  BUN 30* 29*  CREATININE 2.04* 1.98*  CALCIUM 8.8* 8.2*   GFR: Estimated Creatinine Clearance: 34.6 mL/min (A) (by C-G formula based on SCr of 1.98 mg/dL (H)). Liver Function Tests: Recent Labs  Lab 12/08/18 2353 12/09/18 0327  AST 18 16  ALT 17 16  ALKPHOS 44 44  BILITOT 0.9 0.9  PROT 6.6 5.9*  ALBUMIN 3.6 3.1*   No results for input(s): LIPASE, AMYLASE in the last 168 hours. No results for input(s): AMMONIA in the last 168 hours. Coagulation Profile: Recent Labs  Lab 12/08/18 2353  INR 1.4*   Cardiac Enzymes: No results for input(s): CKTOTAL, CKMB, CKMBINDEX, TROPONINI in the last 168 hours. BNP (last 3 results) No results for input(s): PROBNP in the last 8760 hours. HbA1C: No results for input(s): HGBA1C in the last 72 hours. CBG: No results for input(s): GLUCAP in the last 168 hours. Lipid Profile: No results for input(s): CHOL, HDL, LDLCALC, TRIG, CHOLHDL, LDLDIRECT in the last 72 hours. Thyroid Function Tests: No results for input(s): TSH, T4TOTAL, FREET4, T3FREE, THYROIDAB in the last 72 hours. Anemia Panel: No results for input(s): VITAMINB12, FOLATE, FERRITIN, TIBC, IRON, RETICCTPCT in the last 72 hours. Urine analysis:    Component Value Date/Time   COLORURINE YELLOW 12/09/2018 0125   APPEARANCEUR HAZY (A) 12/09/2018 0125   LABSPEC 1.015 12/09/2018 0125   PHURINE 6.0 12/09/2018 0125   GLUCOSEU NEGATIVE  12/09/2018 0125   HGBUR LARGE (A) 12/09/2018 0125   BILIRUBINUR NEGATIVE 12/09/2018 0125  KETONESUR NEGATIVE 12/09/2018 0125   PROTEINUR >=300 (A) 12/09/2018 0125   NITRITE NEGATIVE 12/09/2018 0125   LEUKOCYTESUR NEGATIVE 12/09/2018 0125     Sondos Wolfman M.D. [facility] 12/10/2018, 11:19 AM  Pager: [phone] Between 7am to 7pm - call Pager - [phone]  After 7pm go to [email or web address] - password [email or web address]  Call night coverage person covering after 7pm

## 2018-12-11 ENCOUNTER — Inpatient Hospital Stay (HOSPITAL_COMMUNITY): Payer: Medicare Other

## 2018-12-11 DIAGNOSIS — Z95810 Presence of automatic (implantable) cardiac defibrillator: Secondary | ICD-10-CM

## 2018-12-11 DIAGNOSIS — D696 Thrombocytopenia, unspecified: Secondary | ICD-10-CM

## 2018-12-11 DIAGNOSIS — I10 Essential (primary) hypertension: Secondary | ICD-10-CM

## 2018-12-11 DIAGNOSIS — N183 Chronic kidney disease, stage 3 (moderate): Secondary | ICD-10-CM

## 2018-12-11 DIAGNOSIS — C7800 Secondary malignant neoplasm of unspecified lung: Secondary | ICD-10-CM

## 2018-12-11 LAB — BASIC METABOLIC PANEL
Anion gap: 15 (ref 5–15)
BUN: 45 mg/dL — ABNORMAL HIGH (ref 8–23)
CO2: 18 mmol/L — ABNORMAL LOW (ref 22–32)
Calcium: 8.6 mg/dL — ABNORMAL LOW (ref 8.9–10.3)
Chloride: 105 mmol/L (ref 98–111)
Creatinine, Ser: 2.5 mg/dL — ABNORMAL HIGH (ref 0.61–1.24)
GFR calc Af Amer: 27 mL/min — ABNORMAL LOW (ref >60)
GFR calc non Af Amer: 24 mL/min — ABNORMAL LOW (ref >60)
Glucose, Bld: 226 mg/dL — ABNORMAL HIGH (ref 70–99)
Potassium: 4.3 mmol/L (ref 3.5–5.1)
Sodium: 138 mmol/L (ref 135–145)

## 2018-12-11 LAB — CBC WITH DIFFERENTIAL/PLATELET
Abs Immature Granulocytes: 0.62 10*3/uL — ABNORMAL HIGH (ref 0.00–0.07)
Basophils Absolute: 0 10*3/uL (ref 0.0–0.1)
Basophils Relative: 0 %
Eosinophils Absolute: 0 10*3/uL (ref 0.0–0.5)
Eosinophils Relative: 0 %
HCT: 37.5 % — ABNORMAL LOW (ref 39.0–52.0)
Hemoglobin: 12.7 g/dL — ABNORMAL LOW (ref 13.0–17.0)
Immature Granulocytes: 16 %
Lymphocytes Relative: 14 %
Lymphs Abs: 0.5 10*3/uL — ABNORMAL LOW (ref 0.7–4.0)
MCH: 32.3 pg (ref 26.0–34.0)
MCHC: 33.9 g/dL (ref 30.0–36.0)
MCV: 95.4 fL (ref 80.0–100.0)
Monocytes Absolute: 0.3 10*3/uL (ref 0.1–1.0)
Monocytes Relative: 8 %
Neutro Abs: 2.3 10*3/uL (ref 1.7–7.7)
Neutrophils Relative %: 62 %
Platelets: 125 10*3/uL — ABNORMAL LOW (ref 150–400)
RBC: 3.93 MIL/uL — ABNORMAL LOW (ref 4.22–5.81)
RDW: 14.6 % (ref 11.5–15.5)
WBC: 3.8 10*3/uL — ABNORMAL LOW (ref 4.0–10.5)
nRBC: 0 % (ref 0.0–0.2)

## 2018-12-11 LAB — PATHOLOGIST SMEAR REVIEW

## 2018-12-11 MED ORDER — TECHNETIUM TC 99M MEDRONATE IV KIT
21.7000 | PACK | Freq: Once | INTRAVENOUS | Status: AC | PRN
  Administered 2018-12-11: 21.7 via INTRAVENOUS

## 2018-12-11 MED ORDER — SODIUM CHLORIDE 0.9 % IV SOLN
INTRAVENOUS | Status: DC
  Administered 2018-12-11: 18:00:00 via INTRAVENOUS

## 2018-12-11 NOTE — Progress Notes (Signed)
[facility]                                                                              Patient Demographics  David [name], is a 78 y.o. male, DOB - 11-03-1940, CNO:[id number]  Admit date - 12/08/2018   Admitting Physician Jani Gravel, MD  Outpatient Primary MD for the patient is [name], MD  Outpatient specialists:   LOS - 2  days   Medical records reviewed and are as summarized below:    Chief Complaint  Patient presents with   Hematuria       Brief summary   David [name]. is an 78 y.o. male with a PMH of hypothyroidism, hypertension, hyperlipidemia, CAD status post CABG, history of CVA, and melanoma with brain metastasis complicated by recent leukopenia, thrombocytopenia and ICH who was admitted 12/08/2018 for evaluation of fever and altered mental status associated with generalized weakness and ? mild dysuria.  In the ED, temperature was 102.9 and respiratory rate was 25.  BP was normotensive.  WBC was 1.5 with a hemoglobin of 12.3 and platelets 118.  Creatinine notable for 2.04.  Brain showed a previously described enhancing lesion but no new acute intracranial abnormalities.  Chest x-ray showed findings consistent with CHF/volume overload and mild edema.  CT of the abdomen with renal stone protocol showed no acute intra-abdominal process.  Cultures were obtained and the patient was placed on empiric vancomycin and cefepime.  Source of infection felt to be due to UTI versus right foot cellulitis.   Assessment & Plan    Principal Problem: Pancytopenia with neutropenia, febrile, sepsis likely secondary to right lower extremity nonpurulent cellulitis Currently afebrile, with improved neutropenia (s/p Granix on 12/10/18) Patient met sepsis criteria at the time of admission with fever 102.9 F, tachycardia, leukopenia Blood cultures remain negative, urine culture negative, COVID-19 test negative Initially received vancomycin, cefepime, Flagyl,  antibiotics narrowed to Rocephin IV on 8/31 Oncology, Dr. Burr Medico on board, appreciate recs  Left wrist pain Improving Patient also complaining of left wrist pain, has a prior history of gout  Per patient wrist pain had improved after short course of steroids. ESR, CRP elevated, uric acid 7.6 Left wrist x-ray with severe osteoarthritis of the first carpometacarpal joints Continue prednisone 40 mg daily for 3 days  Benign essential hypertension BP currently stable, continue amlodipine, metoprolol  OSA on CPAP Continue CPAP nightly  History of nontraumatic intracerebral hemorrhage affecting right dominant side with hemiparesis, aphasia Stable, SLP evaluation done on 8/31, no aspiration issues PT evaluation ordered  Acute on chronic kidney disease, stage III/metabolic acidosis Worsening Baseline creatinine around 1.5, admitted with creatinine of 2.0 Repeat BMP in a.m. Renal USS pending Consider nephrology consult  Stage IV malignant melanoma with metastasis to brain, lung Follows Dr. Burr Medico and [name] Diagnosed in 2017, status post left frontal craniotomy, tumor excision, subsequently XRT to the brain and chemotherapy.  He has been on Xgeva for bone metastasis since 02/2016  Acquired hypothyroidism Continue Synthroid  Dyslipidemia Continue Crestor   History of CAD, CABG, biventricular ICD in place Continue aspirin, beta-blocker Troponin 49, likely due to demand ischemia, follow  2D echo result  Code Status: Full DVT Prophylaxis: SCDs secondary to thrombocytopenia Family Communication: None at bedside   Disposition Plan: To be determined   Procedures:  None  Consultants:   Hematology oncology, Dr. Burr Medico  Antimicrobials:   Anti-infectives (From admission, onward)   Start     Dose/Rate Route Frequency Ordered Stop   12/11/18 0845  cefTRIAXone (ROCEPHIN) 2 g in sodium chloride 0.9 % 100 mL IVPB     2 g 200 mL/hr over 30 Minutes Intravenous Every 24 hours 12/10/18  1129     12/10/18 1145  cefTRIAXone (ROCEPHIN) 1 g in sodium chloride 0.9 % 100 mL IVPB     1 g 200 mL/hr over 30 Minutes Intravenous  Once 12/10/18 1130 12/10/18 1900   12/09/18 2200  vancomycin (VANCOCIN) 1,250 mg in sodium chloride 0.9 % 250 mL IVPB  Status:  Discontinued     1,250 mg 166.7 mL/hr over 90 Minutes Intravenous Every 24 hours 12/09/18 0232 12/09/18 0838   12/09/18 1000  ceFEPIme (MAXIPIME) 2 g in sodium chloride 0.9 % 100 mL IVPB  Status:  Discontinued     2 g 200 mL/hr over 30 Minutes Intravenous Every 12 hours 12/09/18 0232 12/09/18 0838   12/09/18 0845  cefTRIAXone (ROCEPHIN) 1 g in sodium chloride 0.9 % 100 mL IVPB  Status:  Discontinued     1 g 200 mL/hr over 30 Minutes Intravenous Every 24 hours 12/09/18 0838 12/10/18 1129   12/08/18 2330  ceFEPIme (MAXIPIME) 2 g in sodium chloride 0.9 % 100 mL IVPB     2 g 200 mL/hr over 30 Minutes Intravenous  Once 12/08/18 2329 12/09/18 0046   12/08/18 2330  metroNIDAZOLE (FLAGYL) IVPB 500 mg     500 mg 100 mL/hr over 60 Minutes Intravenous  Once 12/08/18 2329 12/09/18 0149   12/08/18 2330  vancomycin (VANCOCIN) IVPB 1000 mg/200 mL premix     1,000 mg 200 mL/hr over 60 Minutes Intravenous  Once 12/08/18 2329 12/09/18 0149         Medications  Scheduled Meds:  amLODipine  5 mg Oral Daily   aspirin EC  81 mg Oral Daily   citalopram  10 mg Oral Daily   levothyroxine  137 mcg Oral Q0600   metoprolol succinate  50 mg Oral Daily   predniSONE  40 mg Oral QAC breakfast   rosuvastatin  20 mg Oral Daily   traZODone  25-50 mg Oral QHS   Continuous Infusions:  cefTRIAXone (ROCEPHIN)  IV 2 g (12/11/18 0927)   PRN Meds:.acetaminophen **OR** acetaminophen, methylphenidate      Subjective:   Patient seen and examined at bedside, denies any new complaints.  Eager to know what his bone scan will show.  Objective:   Vitals:   12/10/18 2204 12/11/18 0351 12/11/18 0613 12/11/18 1307  BP: 121/74  112/81 114/79    Pulse: 78  78 78  Resp: _0 Temp: (!) 97.4 F (36.3 C)  (!) 97.5 F (36.4 C) (!) 97.2 F (36.2 C)  TempSrc: Oral  Oral Axillary  SpO2: 95%  95% 97%  Weight:  95.8 kg      Intake/Output Summary (Last 24 hours) at 12/11/2018 1636 Last data filed at 12/11/2018 1156 Gross per 24 hour  Intake 420 ml  Output 350 ml  Net 70 ml     Wt Readings from Last 3 Encounters:  12/11/18 95.8 kg  11/28/18 93 kg  11/15/18 93.9 kg  Exam  General: Alert and oriented x 3, NAD  Eyes:  HEENT:  Atraumatic, normocephalic  Cardiovascular: S1 S2 auscultated, no murmurs, RRR  Respiratory: Clear to auscultation bilaterally, no wheezing, rales or rhonchi  Gastrointestinal: Soft, nontender, nondistended, + bowel sounds  Ext: no pedal edema bilaterally  Neuro: No new deficits  Musculoskeletal: No digital cyanosis, clubbing  Skin: Right lower extremity erythema improving from the margins drawn (also compared with the pictures from his daughter).  Psych: Normal affect and demeanor, alert and oriented x3    Data Reviewed:  I have personally reviewed following labs and imaging studies  Micro Results Recent Results (from the past 240 hour(s))  Blood Culture (routine x 2)     Status: None (Preliminary result)   Collection Time: 12/08/18 11:51 PM   Specimen: BLOOD  Result Value Ref Range Status   Specimen Description BLOOD LEFT ANTECUBITAL  Final   Special Requests   Final    BOTTLES DRAWN AEROBIC AND ANAEROBIC Blood Culture adequate volume   Culture   Final    NO GROWTH 2 DAYS Performed at [facility], [address] [place], [place] [address]    Report Status PENDING  Incomplete  Blood Culture (routine x 2)     Status: None (Preliminary result)   Collection Time: 12/08/18 11:53 PM   Specimen: BLOOD  Result Value Ref Range Status   Specimen Description BLOOD RIGHT ANTECUBITAL  Final   Special Requests   Final    BOTTLES DRAWN AEROBIC AND ANAEROBIC Blood Culture  results may not be optimal due to an excessive volume of blood received in culture bottles   Culture   Final    NO GROWTH 2 DAYS Performed at [facility], [place] [address] [place], [place] [address]    Report Status PENDING  Incomplete  SARS Coronavirus 2 [facility] order, Performed in [facility] hospital lab) Nasopharyngeal Nasopharyngeal Swab     Status: None   Collection Time: 12/09/18 12:24 AM   Specimen: Nasopharyngeal Swab  Result Value Ref Range Status   SARS Coronavirus 2 NEGATIVE NEGATIVE Final    Comment: (NOTE) If result is NEGATIVE SARS-CoV-2 target nucleic acids are NOT DETECTED. The SARS-CoV-2 RNA is generally detectable in upper and lower  respiratory specimens during the acute phase of infection. The lowest  concentration of SARS-CoV-2 viral copies this assay can detect is 250  copies / mL. A negative result does not preclude SARS-CoV-2 infection  and should not be used as the sole basis for treatment or other  patient management decisions.  A negative result may occur with  improper specimen collection / handling, submission of specimen other  than nasopharyngeal swab, presence of viral mutation(s) within the  areas targeted by this assay, and inadequate number of viral copies  (<250 copies / mL). A negative result must be combined with clinical  observations, patient history, and epidemiological information. If result is POSITIVE SARS-CoV-2 target nucleic acids are DETECTED. The SARS-CoV-2 RNA is generally detectable in upper and lower  respiratory specimens dur ing the acute phase of infection.  Positive  results are indicative of active infection with SARS-CoV-2.  Clinical  correlation with patient history and other diagnostic information is  necessary to determine patient infection status.  Positive results do  not rule out bacterial infection or co-infection with other viruses. If result is PRESUMPTIVE POSTIVE SARS-CoV-2 nucleic acids MAY BE PRESENT.     A presumptive positive result was obtained on the submitted specimen  and confirmed on repeat testing.  While 2019 novel coronavirus  (SARS-CoV-2) nucleic acids may be present in the submitted sample  additional confirmatory testing may be necessary for epidemiological  and / or clinical management purposes  to differentiate between  SARS-CoV-2 and other Sarbecovirus currently known to infect humans.  If clinically indicated additional testing with an alternate test  methodology [phone]) is advised. The SARS-CoV-2 RNA is generally  detectable in upper and lower respiratory sp ecimens during the acute  phase of infection. The expected result is Negative. Fact Sheet for Patients:  StrictlyIdeas.no Fact Sheet for Healthcare Providers: BankingDealers.co.za This test is not yet approved or cleared by the [place] FDA and has been authorized for detection and/or diagnosis of SARS-CoV-2 by FDA under an Emergency Use Authorization (EUA).  This EUA will remain in effect (meaning this test can be used) for the duration of the COVID-19 declaration under Section 564(b)(1) of the Act, 21 U.S.C. section 360bbb-3(b)(1), unless the authorization is terminated or revoked sooner. Performed at [facility], [place] [address], [place], [place] [address]   Urine Culture     Status: None   Collection Time: 12/09/18  1:27 AM   Specimen: Urine, Clean Catch  Result Value Ref Range Status   Specimen Description URINE, CLEAN CATCH  Final   Special Requests NONE  Final   Culture   Final    NO GROWTH Performed at [facility], [place] [address] [place], [place] [address]    Report Status 12/10/2018 FINAL  Final    Radiology Reports Dg Wrist Complete Left  Result Date: 12/10/2018 CLINICAL DATA:  Chronic left wrist pain without known injury. EXAM: LEFT WRIST - COMPLETE 3+ VIEW COMPARISON:  None. FINDINGS: There is no evidence of fracture or  dislocation. Severe degenerative changes seen involving the first carpometacarpal joint with osteophyte formation. Soft tissues are unremarkable. IMPRESSION: Severe osteoarthritis of the first carpometacarpal joint. No acute abnormality seen in the left wrist. Electronically Signed   By: Marijo Conception M.D.   On: 12/10/2018 14:46   Ct Head Wo Contrast  Result Date: 12/09/2018 CLINICAL DATA:  Altered level of consciousness, sepsis EXAM: CT HEAD WITHOUT CONTRAST TECHNIQUE: Contiguous axial images were obtained from the base of the skull through the vertex without intravenous contrast. COMPARISON:  Most recent comparison CT September 18, 2018 FINDINGS: Brain: A previously described 4 mm enhancing lesion in the right cerebellum is not well seen in the absence of contrast on today's study. There is a redemonstrated focus of encephalomalacia in the anterior left frontal lobe with associated ex vacuo dilatation of the anterior horn of the left lateral ventricle. No new intracranial lesions are seen. No evidence of acute infarction, hemorrhage, hydrocephalus, extra-axial collection or mass lesion/mass effect. Symmetric prominence of the ventricles, cisterns and sulci compatible with parenchymal volume loss. Patchy areas of white matter hypoattenuation are most compatible with chronic microvascular angiopathy. Vascular: Atherosclerotic calcification of the carotid siphons and intradural vertebral arteries. Skull: Remote left pterional craniotomy changes are again seen. Few prominent venous lakes are unchanged from prior. No calvarial fracture or suspicious osseous lesion. No scalp swelling or hematoma. Sinuses/Orbits: Minimal mural thickening in the left maxillary, sphenoid and ethmoid sinuses. Some pneumatized secretions are present within the ethmoids as well. Bilateral lens extractions are noted. Left scleral buckle is seen. Orbits are otherwise unremarkable. Other: None IMPRESSION: 1. No acute intracranial abnormality.  2. A previously described 4 mm enhancing lesion in the right cerebellum is  not well seen in the absence of contrast on today's study. No new intracranial lesions are seen. Electronically Signed   By: Lovena Le M.D.   On: 12/09/2018 02:29   Nm Bone Scan Whole Body  Result Date: 12/11/2018 CLINICAL DATA:  History of prostate cancer. EXAM: NUCLEAR MEDICINE WHOLE BODY BONE SCAN TECHNIQUE: Whole body anterior and posterior images were obtained approximately 3 hours after intravenous injection of radiopharmaceutical. RADIOPHARMACEUTICALS:  21.7 mCi Technetium-23mMDP IV COMPARISON:  PET scan of September 18, 2018. Radiographs of November 26, 2017. FINDINGS: Minimal abnormal uptake is seen involving both feet consistent with degenerative change. Abnormal uptake is seen involving the right glenohumeral joint suggesting degenerative change. Degenerative changes seen involving the left knee. No other areas of abnormal uptake are noted. IMPRESSION: No definite scintigraphic evidence of osseous metastases. Electronically Signed   By: JMarijo ConceptionM.D.   On: 12/11/2018 15:19   Dg Chest Port 1 View  Result Date: 12/09/2018 CLINICAL DATA:  Sepsis EXAM: PORTABLE CHEST 1 VIEW COMPARISON:  Radiograph 05/24/2017, CT chest 09/01/2015 FINDINGS: Postsurgical changes related to prior CABG including intact and aligned sternotomy wires and multiple surgical clips projecting over the mediastinum. Three lead pacer battery pack overlies the left chest wall with leads in the right atrium, cardiac apex and coronary sinus. Cardiac silhouette is enlarged accounting for the portable technique. The aorta is calcified. There is tortuosity of the right brachiocephalic vessels as well. Calcifications noted at the carotid bifurcations. Lung volumes are diminished. Streaky opacities are present in the lung bases with more focal right infrahilar opacity. Pulmonary vascularity is somewhat cephalized. IMPRESSION: Findings are compatible with  CHF/volume overload with mild edema and cardiomegaly in this patient with history of CABG. Early infectious process could have a similar appearance, particularly in the lung bases, and should be excluded on a clinical basis. Electronically Signed   By: PLovena LeM.D.   On: 12/09/2018 00:16   Dg Foot Complete Right  Result Date: 12/09/2018 CLINICAL DATA:  Possible sepsis, redness on foot EXAM: RIGHT FOOT COMPLETE - 3+ VIEW COMPARISON:  None. FINDINGS: Postsurgical changes from prior first tarsometatarsal arthrodesis secured by 2 fully threaded screws. An additional fully threaded screw at the anterior process of the calcaneus, likely related to prior ORIF, correlate with surgical history. Marked hallux valgus deformity across the first metatarsophalangeal joint with hypertrophic change. There is remote posttraumatic change of the second proximal middle phalanges with posttraumatic fusion. Additional fusion is seen across the fourth middle and distal phalanges. There is diffuse soft tissue swelling and vascular calcification. No convincing features of osteomyelitis such as cortical destruction, periostitis or erosive change though evaluation is limited by the background of extensive degenerative features throughout the foot. There is a small linear radiodensity in the soft tissues adjacent the base of the fifth proximal phalanx which could reflect a small foreign body. No subcutaneous gas. IMPRESSION: 1. No convincing features of osteomyelitis though evaluation is limited by the background of extensive degenerative changes and postsurgical features throughout the foot. 2. Small linear radiodensity in the soft tissues adjacent to the base of the fifth proximal phalanx which could reflect a small foreign body. Electronically Signed   By: PLovena LeM.D.   On: 12/09/2018 00:22   Ct Renal Stone Study  Result Date: 12/09/2018 CLINICAL DATA:  Altered level of consciousness, sepsis EXAM: CT ABDOMEN AND PELVIS  WITHOUT CONTRAST TECHNIQUE: Multidetector CT imaging of the abdomen and pelvis was performed following the standard protocol without  IV contrast. COMPARISON:  Chest radiograph December 08, 2018, PET-CT September 18, 2018 FINDINGS: Lower chest: Minimal atelectatic changes in the lung bases. 3 lead pacer wires are better visualized on scout radiography. Normal heart size. No pericardial effusion. Hepatobiliary: No focal liver abnormality is seen. No gallstones, gallbladder wall thickening, or biliary dilatation. Pancreas: Mild atrophy of the pancreas. No peripancreatic inflammation or ductal dilatation. Spleen: Normal in size without focal abnormality. Adrenals/Urinary Tract: Normal adrenal glands. Bilateral mild renal atrophy with cortical scarring in both kidneys. Mild bilateral nonspecific perinephric stranding, a nonspecific finding though may correlate with either age or decreased renal function. No visible urolithiasis. No hydronephrosis. No concerning renal masses. Urinary bladder is unremarkable. Stomach/Bowel: Distal esophagus, stomach and duodenal sweep are unremarkable. No bowel wall thickening or dilatation. No evidence of obstruction. Cecum is partially displaced into the right upper quadrant. Extensive pancolonic diverticulosis is present without focal pericolonic inflammatory features to suggest diverticulitis. Vascular/Lymphatic: Atherosclerotic plaque is present throughout the aorta. There is mild fusiform dilatation of the infrarenal abdominal aorta up to 3.1 cm in maximal diameter. Mild ectasia of the common iliacs is noted as well measuring up to 2.3 cm on the left and 2.1 cm on the right. No suspicious or enlarged lymph nodes in the included lymphatic chains. Reproductive: The prostate and seminal vesicles are unremarkable. Other: No abdominopelvic free fluid or free gas. No bowel containing hernias. Small fat containing inguinal hernias are noted. Small fat containing umbilical hernia is present.  Musculoskeletal: Multilevel degenerative changes are present in the imaged portions of the spine. There is focal levocurvature at L1-2 with partial fusion across the L1-2 levels. Discogenic and facet degenerative changes result in at least mild canal stenosis at L2-3. Moderate to severe foraminal narrowing is seen bilaterally at L4-5. No acute osseous abnormality or suspicious osseous lesion. IMPRESSION: 1. No acute intra-abdominal process. Specifically no evidence of urolithiasis or hydronephrosis. 2. Mild bilateral renal atrophy. 3. Extensive pancolonic diverticulosis without evidence of diverticulitis. 4. Mild fusiform dilatation of the infrarenal abdominal aorta up to 3.1 cm in maximal diameter. Mild ectasia of the common iliacs bilaterally. Recommend followup by ultrasound in 3 years. This recommendation follows ACR consensus guidelines: White Paper of the ACR Incidental Findings Committee II on Vascular Findings. J Am Coll Radiol 2013; 10:789-794. Aortic aneurysm NOS (ICD10-I71.9) 5. Aortic Atherosclerosis (ICD10-I70.0). Electronically Signed   By: Lovena Le M.D.   On: 12/09/2018 02:37    Lab Data:  CBC: Recent Labs  Lab 12/08/18 2353 12/09/18 0327 12/10/18 1100 12/11/18 0234  WBC 1.5* 1.5* 2.1* 3.8*  NEUTROABS 0.9*  --  1.3* 2.3  HGB 12.3* 11.3* 11.9* 12.7*  HCT 36.3* 34.2* 34.7* 37.5*  MCV 96.5 98.3 95.1 95.4  PLT 118* 106* 129* 998*   Basic Metabolic Panel: Recent Labs  Lab 12/08/18 2353 12/09/18 0327 12/10/18 1100 12/11/18 0234  NA 136 138 137 138  K 4.6 4.6 4.1 4.3  CL 105 109 104 105  CO2 17* 17* 18* 18*  GLUCOSE 171* 165* 193* 226*  BUN 30* 29* 34* 45*  CREATININE 2.04* 1.98* 2.09* 2.50*  CALCIUM 8.8* 8.2* 8.5* 8.6*   GFR: Estimated Creatinine Clearance: 27.3 mL/min (A) (by C-G formula based on SCr of 2.5 mg/dL (H)). Liver Function Tests: Recent Labs  Lab 12/08/18 2353 12/09/18 0327  AST 18 16  ALT 17 16  ALKPHOS 44 44  BILITOT 0.9 0.9  PROT 6.6 5.9*    ALBUMIN 3.6 3.1*   No results for input(s): LIPASE,  AMYLASE in the last 168 hours. No results for input(s): AMMONIA in the last 168 hours. Coagulation Profile: Recent Labs  Lab 12/08/18 2353  INR 1.4*   Cardiac Enzymes: No results for input(s): CKTOTAL, CKMB, CKMBINDEX, TROPONINI in the last 168 hours. BNP (last 3 results) No results for input(s): PROBNP in the last 8760 hours. HbA1C: No results for input(s): HGBA1C in the last 72 hours. CBG: No results for input(s): GLUCAP in the last 168 hours. Lipid Profile: No results for input(s): CHOL, HDL, LDLCALC, TRIG, CHOLHDL, LDLDIRECT in the last 72 hours. Thyroid Function Tests: No results for input(s): TSH, T4TOTAL, FREET4, T3FREE, THYROIDAB in the last 72 hours. Anemia Panel: No results for input(s): VITAMINB12, FOLATE, FERRITIN, TIBC, IRON, RETICCTPCT in the last 72 hours. Urine analysis:    Component Value Date/Time   COLORURINE YELLOW 12/09/2018 0125   APPEARANCEUR HAZY (A) 12/09/2018 0125   LABSPEC 1.015 12/09/2018 0125   PHURINE 6.0 12/09/2018 0125   GLUCOSEU NEGATIVE 12/09/2018 0125   HGBUR LARGE (A) 12/09/2018 0125   BILIRUBINUR NEGATIVE 12/09/2018 0125   KETONESUR NEGATIVE 12/09/2018 0125   PROTEINUR >=300 (A) 12/09/2018 0125   NITRITE NEGATIVE 12/09/2018 0125   LEUKOCYTESUR NEGATIVE 12/09/2018 0125     Alma Friendly M.D. [facility] 12/11/2018, 4:36 PM

## 2018-12-11 NOTE — Plan of Care (Signed)

## 2018-12-12 ENCOUNTER — Ambulatory Visit: Admission: RE | Admit: 2018-12-12 | Payer: Medicare Other | Source: Ambulatory Visit

## 2018-12-12 ENCOUNTER — Encounter: Payer: Self-pay | Admitting: Cardiology

## 2018-12-12 DIAGNOSIS — C7931 Secondary malignant neoplasm of brain: Secondary | ICD-10-CM

## 2018-12-12 LAB — CBC WITH DIFFERENTIAL/PLATELET
Abs Immature Granulocytes: 3.11 10*3/uL — ABNORMAL HIGH (ref 0.00–0.07)
Basophils Absolute: 0 10*3/uL (ref 0.0–0.1)
Basophils Relative: 0 %
Eosinophils Absolute: 0.1 10*3/uL (ref 0.0–0.5)
Eosinophils Relative: 1 %
HCT: 30.2 % — ABNORMAL LOW (ref 39.0–52.0)
Hemoglobin: 10.5 g/dL — ABNORMAL LOW (ref 13.0–17.0)
Immature Granulocytes: 24 %
Lymphocytes Relative: 5 %
Lymphs Abs: 0.6 10*3/uL — ABNORMAL LOW (ref 0.7–4.0)
MCH: 32.4 pg (ref 26.0–34.0)
MCHC: 34.8 g/dL (ref 30.0–36.0)
MCV: 93.2 fL (ref 80.0–100.0)
Monocytes Absolute: 1.3 10*3/uL — ABNORMAL HIGH (ref 0.1–1.0)
Monocytes Relative: 10 %
Neutro Abs: 8 10*3/uL — ABNORMAL HIGH (ref 1.7–7.7)
Neutrophils Relative %: 60 %
Platelets: 112 10*3/uL — ABNORMAL LOW (ref 150–400)
RBC: 3.24 MIL/uL — ABNORMAL LOW (ref 4.22–5.81)
RDW: 14.3 % (ref 11.5–15.5)
WBC: 13.2 10*3/uL — ABNORMAL HIGH (ref 4.0–10.5)
nRBC: 0.4 % — ABNORMAL HIGH (ref 0.0–0.2)

## 2018-12-12 LAB — BASIC METABOLIC PANEL
Anion gap: 13 (ref 5–15)
BUN: 61 mg/dL — ABNORMAL HIGH (ref 8–23)
CO2: 19 mmol/L — ABNORMAL LOW (ref 22–32)
Calcium: 8 mg/dL — ABNORMAL LOW (ref 8.9–10.3)
Chloride: 98 mmol/L (ref 98–111)
Creatinine, Ser: 2.6 mg/dL — ABNORMAL HIGH (ref 0.61–1.24)
GFR calc Af Amer: 26 mL/min — ABNORMAL LOW (ref >60)
GFR calc non Af Amer: 22 mL/min — ABNORMAL LOW (ref >60)
Glucose, Bld: 256 mg/dL — ABNORMAL HIGH (ref 70–99)
Potassium: 4.3 mmol/L (ref 3.5–5.1)
Sodium: 130 mmol/L — ABNORMAL LOW (ref 135–145)

## 2018-12-12 MED ORDER — DOXYCYCLINE MONOHYDRATE 100 MG PO TABS: 100.0000 mg | ORAL_TABLET | Freq: Two times a day (BID) | ORAL | 0 refills | Status: AC

## 2018-12-12 NOTE — Progress Notes (Signed)
Remote pacemaker transmission.   

## 2018-12-12 NOTE — Progress Notes (Signed)
Physical Therapy Treatment Patient Details Name: David [name]. MRN: [id number] DOB: 1940/06/15 Today's Date: 12/12/2018    History of Present Illness Pt is a 78 y/o male admitted secondary to neutropenia with fever and AMS. Pt with sepsis secondary to UTI vs cellulitis. CT of head negative for acute abnormality. 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     PT Comments    Pt progressing well towards physical therapy goals. Daughter present and supportive. Pt able to demonstrate increased independence with functional mobility this date - appears to be more comfortable and confident with HHA vs RW, however feel he needs the support of the RW short term due to balance deficits and decreased tolerance for functional activity. Feel pt is safe for return home with HHPT to follow and an aide to assist with ADL's. Ideally, pt would have support during waking hours for a first few days. Wife will be there with him however she is unable to provide assistance if needed. Discussed updated plan with pt and daughter and they are both in agreement. Will continue to follow and progress as able per POC.     Follow Up Recommendations  Home health PT;Supervision for mobility/OOB     Equipment Recommendations  Rolling walker with 5" wheels    Recommendations for Other Services       Precautions / Restrictions Precautions Precautions: Fall Restrictions Weight Bearing Restrictions: No    Mobility  Bed Mobility Overal bed mobility: Needs Assistance Bed Mobility: Sit to Supine       Sit to supine: Supervision   General bed mobility comments: Pt was able to transition to supine from EOB without assistance. HOB was flat and pt did not use railings for support.   Transfers Overall transfer level: Needs assistance Equipment used: Rolling walker (2 wheeled);None Transfers: Sit  to/from Stand Sit to Stand: Supervision         General transfer comment: Pt able to power-up to full stand from low commode and EOB without assistance.   Ambulation/Gait Ambulation/Gait assistance: Min guard;Min assist Gait Distance (Feet): 250 Feet Assistive device: Rolling walker (2 wheeled);1 person hand held assist Gait Pattern/deviations: Step-through pattern;Decreased stride length;Trunk flexed Gait velocity: Decreased Gait velocity interpretation: <1.8 ft/sec, indicate of risk for recurrent falls General Gait Details: Initially with RW - pt flexed and having difficulty with fluidity of walker movement. Trialed HHA in hall without AD. Pt was improved and was able to ambulate more casually and confidently with light min assist.    Stairs             Wheelchair Mobility    Modified Rankin (Stroke Patients Only)       Balance Overall balance assessment: Needs assistance Sitting-balance support: No upper extremity supported;Feet supported Sitting balance-Leahy Scale: Fair     Standing balance support: Bilateral upper extremity supported;During functional activity Standing balance-Leahy Scale: Poor Standing balance comment: Reliant on BUE support                             Cognition Arousal/Alertness: Awake/alert Behavior During Therapy: WFL for tasks assessed/performed Overall Cognitive Status: Impaired/Different from baseline Area of Impairment: Memory;Safety/judgement;Awareness                     Memory: Decreased short-term memory   Safety/Judgement: Decreased awareness of  safety Awareness: Emergent          Exercises      General Comments        Pertinent Vitals/Pain Pain Assessment: Faces Faces Pain Scale: No hurt    Home Living                      Prior Function            PT Goals (current goals can now be found in the care plan section) Acute Rehab PT Goals Patient Stated Goal: to feel better PT  Goal Formulation: With patient Time For Goal Achievement: 12/24/18 Potential to Achieve Goals: Good Progress towards PT goals: Progressing toward goals    Frequency    Min 3X/week      PT Plan Discharge plan needs to be updated;Frequency needs to be updated    Co-evaluation              AM-PAC PT "6 Clicks" Mobility   Outcome Measure  Help needed turning from your back to your side while in a flat bed without using bedrails?: None Help needed moving from lying on your back to sitting on the side of a flat bed without using bedrails?: None Help needed moving to and from a bed to a chair (including a wheelchair)?: A Little Help needed standing up from a chair using your arms (e.g., wheelchair or bedside chair)?: A Little Help needed to walk in hospital room?: A Little Help needed climbing 3-5 steps with a railing? : A Little 6 Click Score: 20    End of Session Equipment Utilized During Treatment: Gait belt Activity Tolerance: Patient limited by fatigue Patient left: in chair;with call bell/phone within reach;with chair alarm set Nurse Communication: Mobility status PT Visit Diagnosis: Unsteadiness on feet (R26.81);Muscle weakness (generalized) (M62.81)     Time: 0160-1093 PT Time Calculation (min) (ACUTE ONLY): 27 min  Charges:  $Gait Training: 23-37 mins                     Rolinda Roan, PT, DPT Acute Rehabilitation Services Pager: [phone] Office: [phone]     Thelma Comp 12/12/2018, 2:50 PM

## 2018-12-12 NOTE — Care Management Important Message (Signed)
Important Message  Patient Details  Name: David [name]. MRN: [id number] Date of Birth: 29-Aug-1940   Medicare Important Message Given:  Yes     Mordechai Matuszak 12/12/2018, 11:26 AM

## 2018-12-12 NOTE — Progress Notes (Signed)
Inpatient Rehab Admissions Coordinator:   Note PT updated recommendations to home health.  Will discontinue CIR order.   Shann Medal, PT, DPT Admissions Coordinator [phone] 12/12/18  4:30 PM

## 2018-12-12 NOTE — Consult Note (Signed)
[facility]  INPATIENT CONSULTATION  Reason for Consultation: AKI on CKD Requesting Provider: Dr. Horris Latino  HPI: Richardson Dubree. is an 78 y.o. male with h/o metastatic melanoma, recent prostate cancer dx, HTN, HL, CAD s/p CABG, h/o CVA who is seen for evaluation and management of AKI on CKD.    He was admitted 12/08/18 with fever 104 and confusion.  Had mild dysuria, R foot erythema.  Treated with vanc + cefepime initially.  CT head ok, CXR mild edema, CT renal stone negative acute (mild BL renal atrophy).  Blood and urine cultures negative, sepsis thought ultimately related to cellulitis.  Transitioned to ceftriaxone and plan for doxycycline on discharge.    He has a recent diagnosis of prostate cancer 11/2018 and is under evaluation for that.  He also has recent development of pancytopenia and actually had neutropenia requiring Granix during this admission.  Plans for bone marrow biopsy in 1-2 weeks underway.   Has known dx of TBMD (renal biopsy in 1990s due to hematuria).  Baseline Cr 1.5.  Daughter reports chronic h/o heavy NSAID use and thinks CKD from that.  No recent NSAIDs.   Cr trended 2 on admission to 2.5 yesterday 2.6 today.  UOP was initially 600/day, yesterday only 149m documented but today 18098malready documented.    He feels improved and per report plans for discharge today.   PMH: Past Medical History:  Diagnosis Date  . Anginal pain (HCRio [place]o  . Arthritis    mostly hands  . Benign familial hematuria   . BPH (benign prostatic hypertrophy)   . Brain cancer (HC[place]   melanoma with brain met  . Coronary artery disease   . GERD (gastroesophageal reflux disease)   . High cholesterol   . Hypertension   . Hypothyroidism   . Intracerebral hemorrhage (HCHazard  . Melanoma (HC[place]  . Metastatic melanoma to lung (HCLouisa  . Mood disorder (HCWilson [place]  . Myocardial infarction (HCVal[place]  . Pacemaker    due to syncope, 3rd degree HB ([facility]  (Dr. BoUvaldo Rising . Rectal bleed    due to NSAIDS  . Renal disorder   . Sepsis (HCSpencer08/31/2020  . Skin cancer    melanoma  . Sleep apnea    uses CPAP  . [facility] ([facility]   PSH: Past Surgical History:  Procedure Laterality Date  . APPLICATION OF CRANIAL NAVIGATION N/A 10/15/2015   Procedure: APPLICATION OF CRANIAL NAVIGATION;  Surgeon: JoErline LevineMD;  [facility];  Service: Neurosurgery;  Laterality: N/A;  . CARDIAC CATHETERIZATION  2013  . CARDIAC SURGERY     bypass X 2  . COLONOSCOPY WITH PROPOFOL N/A 05/08/2016   diverticulosis, int hem, no f/u needed (AVicente Males . CORONARY ARTERY BYPASS GRAFT  2013   LIMA-LAD, SVG-PDA 03/27/11 (Dr. KrFrancee Gentile . CRANIOTOMY N/A 10/15/2015   Procedure: LEFT FRONTAL CRANIOTOMY TUMOR EXCISION with Curve;  Surgeon: JoErline LevineMD;  [facility];  Service: Neurosurgery;  Laterality: N/A;  CRANIOTOMY TUMOR EXCISION  . EYE SURGERY    . FOOT SURGERY  08/2010  . JOINT REPLACEMENT Left    partial knee  . KNEE ARTHROSCOPY  12/2008  . LYMPH NODE BIOPSY    . PACEMAKER INSERTION    . TONSILLECTOMY      Past Medical History:  Diagnosis Date  . Anginal pain (HCMakemie Park  . Arthritis    mostly hands  .  Benign familial hematuria   . BPH (benign prostatic hypertrophy)   . Brain cancer ([place])    melanoma with brain met  . Coronary artery disease   . GERD (gastroesophageal reflux disease)   . High cholesterol   . Hypertension   . Hypothyroidism   . Intracerebral hemorrhage ([place])   . Melanoma ([place])   . Metastatic melanoma to lung ([place])   . Mood disorder ([place])   . Myocardial infarction ([place])   . Pacemaker    due to syncope, 3rd degree HB (upgrade to [facility]. Jude CRT-P 02/25/13 (Dr. Uvaldo Rising)  . Rectal bleed    due to NSAIDS  . Renal disorder   . Sepsis ([place]) 12/09/2018  . Skin cancer    melanoma  . Sleep apnea    uses CPAP  . Stroke [facility])     Medications:  I have reviewed the patient's current medications.  Medications Prior to Admission   Medication Sig Dispense Refill  . acetaminophen (TYLENOL) 500 MG tablet Take 500 mg by mouth See admin instructions. Take 1 tablet three times daily as needed for pain and 1 tablet every night at bedtime    . amLODipine (NORVASC) 5 MG tablet TAKE ONE TABLET BY MOUTH EVERY DAY (Patient taking differently: Take 5 mg by mouth daily. ) 32 tablet 11  . aspirin EC 81 MG tablet Take 81 mg by mouth daily.    . Cholecalciferol (VITAMIN D3) 1000 units CAPS Take 1 capsule (1,000 Units total) by mouth daily. 30 capsule   . citalopram (CELEXA) 10 MG tablet Take 1 tablet (10 mg total) by mouth daily. 90 tablet 1  . levothyroxine (SYNTHROID, LEVOTHROID) 137 MCG tablet TAKE 1 TABLET BY MOUTH ONCE DAILY BEFORE BREAKFAST (Patient taking differently: Take 137 mcg by mouth daily before breakfast. ) 30 tablet 11  . methylphenidate (RITALIN) 10 MG tablet Take 1 tablet (10 mg total) by mouth daily. (Patient taking differently: Take 10 mg by mouth daily as needed (when needing to concentrate). ) 30 tablet 0  . metoprolol succinate (TOPROL-XL) 50 MG 24 hr tablet TAKE 1 AND 1/2 TABLET (75 MG) BY MOUTH EVERY DAY WITH OR IMMEDIATELY FOLLOWING A MEAL *DO NOT CRUSH* 50 tablet 11  . rosuvastatin (CRESTOR) 20 MG tablet TAKE ONE TABLET BY MOUTH AT BEDTIME (Patient taking differently: Take 20 mg by mouth daily. ) 30 tablet 8  . traZODone (DESYREL) 50 MG tablet Take 0.5-1 tablets (25-50 mg total) by mouth at bedtime. 30 tablet 1  . Turmeric 500 MG CAPS Take 2 capsules by mouth daily.    . vitamin E (VITAMIN E) 400 UNIT capsule Take 1 capsule (400 Units total) by mouth 2 (two) times daily. 60 capsule 6  . cephALEXin (KEFLEX) 500 MG capsule Take 1 capsule (500 mg total) by mouth 3 (three) times daily. (Patient not taking: Reported on 12/09/2018) 21 capsule 0  . fluocinonide cream (LIDEX) 0.05 % Apply topically 2 (two) times daily as needed. (Patient not taking: Reported on 12/09/2018) 30 g 0  . ketoconazole (NIZORAL) 2 % shampoo Apply 1  application topically 2 (two) times a week. (Patient not taking: Reported on 12/09/2018) 120 mL 1  . pentoxifylline (TRENTAL) 400 MG CR tablet Take 1 tablet (400 mg total) by mouth 2 (two) times daily. (Patient not taking: Reported on 12/09/2018) 60 tablet 6  . predniSONE (DELTASONE) 20 MG tablet 3 tabs by mouth daily x 3 days, then 2 tabs by mouth daily x 2 days then 1 tab by  mouth daily x 2 days (Patient not taking: Reported on 12/09/2018) 15 tablet 0  . testosterone cypionate (DEPOTESTOTERONE CYPIONATE) 100 MG/ML injection Inject 1 mL (100 mg total) into the muscle every 14 (fourteen) days. (Patient not taking: Reported on 12/09/2018) 10 mL 0    ALLERGIES:   Allergies  Allergen Reactions  . Ezetimibe Other (See Comments)    unknown  . Simvastatin Other (See Comments)    Reaction:  Gave pt a fever  Fever - temp of 103.   In 2003    FAM HX: Family History  Problem Relation Age of Onset  . Cancer Mother 57       lung   . Hypertension Mother   . Hypertension Father   . Heart attack Father   . Heart disease Father   . Rheum arthritis Sister   . Cancer Maternal Grandmother     Social History:   reports that he quit smoking about 61 years ago. He has a 3.00 pack-year smoking history. He has never used smokeless tobacco. He reports current alcohol use of about 14.0 standard drinks of alcohol per week. He reports that he does not use drugs.  ROS: 12 system ROS negative except per HPI above  Blood pressure 110/61, pulse 77, temperature 98.2 F (36.8 C), resp. rate 18, height 5' 9"  (1.753 m), weight 95.8 kg, SpO2 94 %. PHYSICAL EXAM: Gen: elderly man lying flat in bed  Eyes:  anicteric ENT: MM tacky with him mouth breathing Neck: supple, no JVD CV:  RRR, no rub Abd: soft, nontender Lungs: normal WOB, no rales laterally Extr: no edema Neuro: conversant Skin: mild RLE erythema - much improved per report   Results for orders placed or performed during the hospital encounter of  12/08/18 (from the past 48 hour(s))  CBC with Differential/Platelet     Status: Abnormal   Collection Time: 12/11/18  2:34 AM  Result Value Ref Range   WBC 3.8 (L) 4.0 - 10.5 K/uL   RBC 3.93 (L) 4.22 - 5.81 MIL/uL   Hemoglobin 12.7 (L) 13.0 - 17.0 g/dL   HCT 37.5 (L) 39.0 - 52.0 %   MCV 95.4 80.0 - 100.0 fL   MCH 32.3 26.0 - 34.0 pg   MCHC 33.9 30.0 - 36.0 g/dL   RDW 14.6 11.5 - 15.5 %   Platelets 125 (L) 150 - 400 K/uL    Comment: REPEATED TO VERIFY Immature Platelet Fraction may be clinically indicated, consider ordering this additional test [id number]    nRBC 0.0 0.0 - 0.2 %   Neutrophils Relative % 62 %   Neutro Abs 2.3 1.7 - 7.7 K/uL   Lymphocytes Relative 14 %   Lymphs Abs 0.5 (L) 0.7 - 4.0 K/uL   Monocytes Relative 8 %   Monocytes Absolute 0.3 0.1 - 1.0 K/uL   Eosinophils Relative 0 %   Eosinophils Absolute 0.0 0.0 - 0.5 K/uL   Basophils Relative 0 %   Basophils Absolute 0.0 0.0 - 0.1 K/uL   Immature Granulocytes 16 %   Abs Immature Granulocytes 0.62 (H) 0.00 - 0.07 K/uL    Comment: Performed at [facility], [address], [place], [place] [address]  Basic metabolic panel     Status: Abnormal   Collection Time: 12/11/18  2:34 AM  Result Value Ref Range   Sodium 138 135 - 145 mmol/L   Potassium 4.3 3.5 - 5.1 mmol/L   Chloride 105 98 - 111 mmol/L   CO2 18 (L)  22 - 32 mmol/L   Glucose, Bld 226 (H) 70 - 99 mg/dL   BUN 45 (H) 8 - 23 mg/dL   Creatinine, Ser 2.50 (H) 0.61 - 1.24 mg/dL   Calcium 8.6 (L) 8.9 - 10.3 mg/dL   GFR calc non Af Amer 24 (L) >60 mL/min   GFR calc Af Amer 27 (L) >60 mL/min   Anion gap 15 5 - 15    Comment: Performed at [facility] [address] [place], [place] [address]  Basic metabolic panel     Status: Abnormal   Collection Time: 12/12/18  3:09 AM  Result Value Ref Range   Sodium 130 (L) 135 - 145 mmol/L    Comment: DELTA CHECK NOTED   Potassium 4.3 3.5 - 5.1 mmol/L   Chloride 98 98 - 111 mmol/L   CO2 19 (L) 22 - 32  mmol/L   Glucose, Bld 256 (H) 70 - 99 mg/dL   BUN 61 (H) 8 - 23 mg/dL   Creatinine, Ser 2.60 (H) 0.61 - 1.24 mg/dL   Calcium 8.0 (L) 8.9 - 10.3 mg/dL   GFR calc non Af Amer 22 (L) >60 mL/min   GFR calc Af Amer 26 (L) >60 mL/min   Anion gap 13 5 - 15    Comment: Performed at [facility] [address], [place], [place] [address]  CBC with Differential/Platelet     Status: Abnormal   Collection Time: 12/12/18  3:09 AM  Result Value Ref Range   WBC 13.2 (H) 4.0 - 10.5 K/uL   RBC 3.24 (L) 4.22 - 5.81 MIL/uL   Hemoglobin 10.5 (L) 13.0 - 17.0 g/dL   HCT 30.2 (L) 39.0 - 52.0 %   MCV 93.2 80.0 - 100.0 fL   MCH 32.4 26.0 - 34.0 pg   MCHC 34.8 30.0 - 36.0 g/dL   RDW 14.3 11.5 - 15.5 %   Platelets 112 (L) 150 - 400 K/uL    Comment: REPEATED TO VERIFY PLATELET COUNT CONFIRMED BY SMEAR SPECIMEN CHECKED FOR CLOTS Immature Platelet Fraction may be clinically indicated, consider ordering this additional test [id number]    nRBC 0.4 (H) 0.0 - 0.2 %   Neutrophils Relative % 60 %   Neutro Abs 8.0 (H) 1.7 - 7.7 K/uL   Lymphocytes Relative 5 %   Lymphs Abs 0.6 (L) 0.7 - 4.0 K/uL   Monocytes Relative 10 %   Monocytes Absolute 1.3 (H) 0.1 - 1.0 K/uL   Eosinophils Relative 1 %   Eosinophils Absolute 0.1 0.0 - 0.5 K/uL   Basophils Relative 0 %   Basophils Absolute 0.0 0.0 - 0.1 K/uL   Immature Granulocytes 24 %   Abs Immature Granulocytes 3.11 (H) 0.00 - 0.07 K/uL    Comment: Performed at [facility], [place] [address], [place], [place] [address]    Nm Bone Scan Whole Body  Result Date: 12/11/2018 CLINICAL DATA:  History of prostate cancer. EXAM: NUCLEAR MEDICINE WHOLE BODY BONE SCAN TECHNIQUE: Whole body anterior and posterior images were obtained approximately 3 hours after intravenous injection of radiopharmaceutical. RADIOPHARMACEUTICALS:  21.7 mCi Technetium-69mMDP IV COMPARISON:  PET scan of September 18, 2018. Radiographs of November 26, 2017. FINDINGS: Minimal abnormal uptake is seen  involving both feet consistent with degenerative change. Abnormal uptake is seen involving the right glenohumeral joint suggesting degenerative change. Degenerative changes seen involving the left knee. No other areas of abnormal uptake are noted. IMPRESSION: No definite scintigraphic evidence of osseous  metastases. Electronically Signed   By: Marijo Conception M.D.   On: 12/11/2018 15:19   US Renal  Result Date: 12/11/2018 CLINICAL DATA:  Acute renal insufficiency. EXAM: RENAL / URINARY TRACT ULTRASOUND COMPLETE COMPARISON:  CT of the abdomen December 09, 2018 FINDINGS: Right Kidney: Renal measurements: 11.1 x 6.2 x 5.7 cm = volume: 205 mL. Increased cortical echogenicity and mild cortical thinning. Complex cystic structure in the midpole region of the right kidney measures 2.7 x 2 cm. Additional 1 cm benign-appearing cyst in the upper pole of the right kidney. Left Kidney: Renal measurements: 13 x 5.6 x 6.3 cm = volume: 240 mL. Increased cortical echogenicity and mild cortical thinning. Simple appearing cyst in the midpole region of the left kidney measures 1.9 cm. Additional complex cystic structure in the lower pole of the left kidney measures 1.3 cm Bladder: Appears normal for degree of bladder distention. IMPRESSION: Increased cortical echogenicity and mild cortical thinning bilaterally. Bilateral complex and simple appearing renal cysts. Indention on future follow-up. Normal appearance of the urinary bladder. Electronically Signed   By: Fidela Salisbury M.D.   On: 12/11/2018 20:29    Assessment/Plan **AKI on CKD:  Baseline Cr ~1.5 over years with h/o mild AKIs in setting of hypovolemia.  Cr 2 on presentation, 2.6 today; initially oliguric but much improved with 1.8L already today.  I expect this was hemodynamic mediated acute tubular injury/ATN in setting of sepsis and hypovolemia on presentation.  Renal US was unrevealing.  UA with hematuria, proteinuria but has known TBMD; I do not think additional  glomerular process going on here. I expect over the next 1-2 weeks he will have improvement in creatinine.  He may return back to baseline 1.5, may be a bit higher after this insult.  Long discussion with he and daughter and they elect to have PCP and oncology monitor for now with nephrology referral if he doesn't improve or if other questions arise.  I think that is a reasonable approach.   Avoid hypovolemia, Avoid NSAIDs.  Would avoid chronic RAAS inhibitors (ACEi or ARB).   **Hematuria:  Known TBMD, acute febrile illness could have caused more hematuria than usual vs issue with prostate ca.  CTM.  **HTN:  Per above wouldn't use ACEi or ARB for BP control given repeated AKIs with hypovolemia.   **Prostate Ca: new dx, eval underway  **Pancytopenia:  rec'd granix this admission, oncology following and plan bone marrow biopsy soon.    **metastatic melanoma:  Stable as of 09/2018 imaging.  Will sign off.  Please don't hesitate to reach out to myself of [facility] should future issues arise that we can help with.   Justin Mend 12/12/2018, 3:35 PM

## 2018-12-12 NOTE — Discharge Summary (Addendum)
Discharge Summary  David [name]. OQH:[id number] DOB: Feb 16, 1941  PCP: [name], MD  Admit date: 12/08/2018 Discharge date: 12/12/2018  Time spent: 45 mins  Recommendations for Outpatient Follow-up:  Follow-up with PCP in 1 week Follow-up with oncology as scheduled  Discharge Diagnoses:  Active Hospital Problems   Diagnosis Date Noted   Neutropenia with fever ([place]) 12/09/2018   Altered mental status 12/09/2018   Sepsis ([place]) 12/09/2018   Fever 12/09/2018   Thrombocytopenia ([place]) 12/09/2018   Presence of biventricular cardiac pacemaker 10/01/2018   Dyslipidemia 03/27/2018   Hx of CABG 07/17/2017   Atherosclerosis of native coronary artery of native heart with stable angina pectoris ([place]) 04/24/2016   Ischemic cardiomyopathy 04/24/2016   Acquired hypothyroidism 11/24/2015   Benign prostatic hyperplasia    Metastatic melanoma to lung ([place])    Metastasis to brain ([place]) 10/15/2015   Malignant melanoma ([place]) 09/22/2015   Chronic kidney disease, stage III (moderate) (HCC)    Aphasia following nontraumatic intracerebral hemorrhage 08/26/2015   Hemiplegia and hemiparesis following nontraumatic intracerebral hemorrhage affecting right dominant side ([place]) 08/26/2015   Overweight with body mass index (BMI) 25.0-29.9 08/26/2015   Benign essential HTN    Biventricular ICD (implantable cardioverter-defibrillator) in place    OSA on CPAP     Resolved Hospital Problems  No resolved problems to display.    Discharge Condition: Stable  Diet recommendation: Heart healthy  Vitals:   12/12/18 0912 12/12/18 1531  BP: 118/77 110/61  Pulse: 74 77  Resp: 16 18  Temp: 98 F (36.7 C) 98.2 F (36.8 C)  SpO2: 99% 94%    History of present illness:  David Posadas. is an 78 y.o. male with a PMH of hypothyroidism, hypertension, hyperlipidemia, CAD status post CABG, history of CVA, and melanoma with brain metastasis complicated by recent leukopenia, thrombocytopenia  and ICH who was admitted 12/08/2018 for evaluation of fever and altered mental status associated with generalized weakness and ? mild dysuria.  In the ED, temperature was 102.9 and respiratory rate was 25.  BP was normotensive.  WBC was 1.5 with a hemoglobin of 12.3 and platelets 118.  Creatinine notable for 2.04.  Brain showed a previously described enhancing lesion but no new acute intracranial abnormalities.  Chest x-ray showed findings consistent with CHF/volume overload and mild edema.  CT of the abdomen with renal stone protocol showed no acute intra-abdominal process.  Cultures were obtained and the patient was placed on empiric vancomycin and cefepime.  Source of infection felt to be due to UTI versus right foot cellulitis.    Today, patient denies any new complaints.  Discussed plan of care with daughter at bedside.  Patient stable to be discharged with close follow-up with PCP and oncology  Hospital Course:  Principal Problem:   Neutropenia with fever ([place]) Active Problems:   Benign essential HTN   OSA on CPAP   Biventricular ICD (implantable cardioverter-defibrillator) in place   Overweight with body mass index (BMI) 25.0-29.9   Hemiplegia and hemiparesis following nontraumatic intracerebral hemorrhage affecting right dominant side (HCC)   Aphasia following nontraumatic intracerebral hemorrhage   Chronic kidney disease, stage III (moderate) (HCC)   Malignant melanoma (HCC)   Metastasis to brain [facility])   Metastatic melanoma to lung (HCC)   Benign prostatic hyperplasia   Acquired hypothyroidism   Atherosclerosis of native coronary artery of native heart with stable angina pectoris ([place])   Ischemic cardiomyopathy   Hx of CABG   Dyslipidemia   Presence of  biventricular cardiac pacemaker   Altered mental status   Sepsis ([place])   Fever   Thrombocytopenia (HCC)   Pancytopenia with neutropenia, febrile, sepsis likely secondary to right lower extremity nonpurulent  cellulitis Currently afebrile, with improved neutropenia (s/p Granix on 12/10/18) Right lower extremity cellulitis also improved significantly Patient met sepsis criteria at the time of admission with fever 102.9 F, tachycardia, leukopenia Blood cultures remain negative, urine culture negative, COVID-19 test negative Initially received vancomycin, cefepime, Flagyl, antibiotics narrowed to Rocephin IV on 8/31, will DC patient on p.o. doxycycline for total of 7 days of antibiotics Follow-up with PCP and oncology, Dr. Burr Medico  Metabolic encephalopathy likely due to sepsis as above Resolved   Left wrist pain Resolved Patient also complaining of left wrist pain, has a prior history of gout  Per patient wrist pain had improved after short course of steroids. ESR, CRP elevated, uric acid 7.6 Left wrist x-ray with severe osteoarthritis of the first carpometacarpal joints Completed prednisone 40 mg daily for 3 days   Benign essential hypertension BP currently stable, continue amlodipine, metoprolol   OSA on CPAP Continue CPAP nightly   History of nontraumatic intracerebral hemorrhage affecting right dominant side with hemiparesis, aphasia Stable, SLP evaluation done on 8/31, no aspiration issues PT evaluation ordered   Acute on chronic kidney disease, stage III/metabolic acidosis Worsening Baseline creatinine around 1.5, admitted with creatinine of 2.0 Renal ultrasound unremarkable, just showing vertical echogenicity and mild cortical thinning bilaterally.  Bilateral complex and simple appearing renal cysts.  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 Burr Medico and [name] Diagnosed in 2017, status post left frontal craniotomy, tumor excision, subsequently XRT to the brain and  chemotherapy.  He has been on Xgeva for bone metastasis since 02/2016 Continue follow-up as an outpatient   Acquired hypothyroidism Continue Synthroid   Dyslipidemia Continue Crestor   History of CAD, CABG, biventricular ICD in place Chest pain-free Continue aspirin, beta-blocker Troponin 49, likely due to demand ischemia Echo showed EF of 55%, left ventricular diastolic Doppler parameters are consistent with impaired relaxation, basal to mid inferior lateral hypokinesis        Malnutrition Type:      Malnutrition Characteristics:      Nutrition Interventions:      Estimated body mass index is 31.19 kg/m as calculated from the following:   Height as of this encounter: 5' 9"  (1.753 m).   Weight as of this encounter: 95.8 kg.    Procedures: None  Consultations: Oncology, Dr. Burr Medico Nephrology  Discharge Exam: BP 110/61 (BP Location: Right Arm)    Pulse 77    Temp 98.2 F (36.8 C)    Resp 18    Ht 5' 9"  (1.753 m)    Wt 95.8 kg    SpO2 94%    BMI 31.19 kg/m   General: NAD Cardiovascular: S1, S2 present Respiratory: CTA B  Discharge Instructions You were cared for by a hospitalist during your hospital stay. If you have any questions about your discharge medications or the care you received while you were in the hospital after you are discharged, you can call the unit and asked to speak with the hospitalist on call if the hospitalist that took care of you is not available. Once you are discharged, your primary care physician will handle any further  medical issues. Please note that NO REFILLS for any discharge medications will be authorized once you are discharged, as it is imperative that you return to your primary care physician (or establish a relationship with a primary care physician if you do not have one) for your aftercare needs so that they can reassess your need for medications and monitor your lab values.    Allergies as of 12/12/2018       Reactions    Ezetimibe Other (See Comments)   unknown   Simvastatin Other (See Comments)   Reaction:  Gave pt a fever  Fever - temp of 103.   In 2003        Medication List     STOP taking these medications    cephALEXin 500 MG capsule Commonly known as: KEFLEX   fluocinonide cream 0.05 % Commonly known as: LIDEX   ketoconazole 2 % shampoo Commonly known as: Nizoral   pentoxifylline 400 MG CR tablet Commonly known as: TRENTAL   predniSONE 20 MG tablet Commonly known as: DELTASONE   testosterone cypionate 100 MG/ML injection Commonly known as: DEPOTESTOTERONE CYPIONATE       TAKE these medications    acetaminophen 500 MG tablet Commonly known as: TYLENOL Take 500 mg by mouth See admin instructions. Take 1 tablet three times daily as needed for pain and 1 tablet every night at bedtime   amLODipine 5 MG tablet Commonly known as: NORVASC TAKE ONE TABLET BY MOUTH EVERY DAY   aspirin EC 81 MG tablet Take 81 mg by mouth daily.   citalopram 10 MG tablet Commonly known as: CELEXA Take 1 tablet (10 mg total) by mouth daily.   doxycycline 100 MG tablet Commonly known as: ADOXA Take 1 tablet (100 mg total) by mouth 2 (two) times daily for 2 days. Start taking on: December 13, 2018   levothyroxine 137 MCG tablet Commonly known as: SYNTHROID TAKE 1 TABLET BY MOUTH ONCE DAILY BEFORE BREAKFAST What changed:  how much to take how to take this when to take this additional instructions   methylphenidate 10 MG tablet Commonly known as: RITALIN Take 1 tablet (10 mg total) by mouth daily. What changed:  when to take this reasons to take this   metoprolol succinate 50 MG 24 hr tablet Commonly known as: TOPROL-XL TAKE 1 AND 1/2 TABLET (75 MG) BY MOUTH EVERY DAY WITH OR IMMEDIATELY FOLLOWING A MEAL *DO NOT CRUSH*   rosuvastatin 20 MG tablet Commonly known as: CRESTOR TAKE ONE TABLET BY MOUTH AT BEDTIME What changed: when to take this   traZODone 50 MG tablet Commonly  known as: DESYREL Take 0.5-1 tablets (25-50 mg total) by mouth at bedtime.   Turmeric 500 MG Caps Take 2 capsules by mouth daily.   Vitamin D3 25 MCG (1000 UT) Caps Take 1 capsule (1,000 Units total) by mouth daily.   vitamin E 400 UNIT capsule Commonly known as: vitamin E Take 1 capsule (400 Units total) by mouth 2 (two) times daily.       Allergies  Allergen Reactions   Ezetimibe Other (See Comments)    unknown   Simvastatin Other (See Comments)    Reaction:  Gave pt a fever  Fever - temp of 103.   In 2003   Follow-up Information     [name], MD. Schedule an appointment as soon as possible for a visit in 1 week(s).   Specialty: Family Medicine Contact information: [address] [place] [address] [phone]  The results of significant diagnostics from this hospitalization (including imaging, microbiology, ancillary and laboratory) are listed below for reference.    Significant Diagnostic Studies: Dg Wrist Complete Left  Result Date: 12/10/2018 CLINICAL DATA:  Chronic left wrist pain without known injury. EXAM: LEFT WRIST - COMPLETE 3+ VIEW COMPARISON:  None. FINDINGS: There is no evidence of fracture or dislocation. Severe degenerative changes seen involving the first carpometacarpal joint with osteophyte formation. Soft tissues are unremarkable. IMPRESSION: Severe osteoarthritis of the first carpometacarpal joint. No acute abnormality seen in the left wrist. Electronically Signed   By: Marijo Conception M.D.   On: 12/10/2018 14:46   Ct Head Wo Contrast  Result Date: 12/09/2018 CLINICAL DATA:  Altered level of consciousness, sepsis EXAM: CT HEAD WITHOUT CONTRAST TECHNIQUE: Contiguous axial images were obtained from the base of the skull through the vertex without intravenous contrast. COMPARISON:  Most recent comparison CT September 18, 2018 FINDINGS: Brain: A previously described 4 mm enhancing lesion in the right cerebellum is not well  seen in the absence of contrast on today's study. There is a redemonstrated focus of encephalomalacia in the anterior left frontal lobe with associated ex vacuo dilatation of the anterior horn of the left lateral ventricle. No new intracranial lesions are seen. No evidence of acute infarction, hemorrhage, hydrocephalus, extra-axial collection or mass lesion/mass effect. Symmetric prominence of the ventricles, cisterns and sulci compatible with parenchymal volume loss. Patchy areas of white matter hypoattenuation are most compatible with chronic microvascular angiopathy. Vascular: Atherosclerotic calcification of the carotid siphons and intradural vertebral arteries. Skull: Remote left pterional craniotomy changes are again seen. Few prominent venous lakes are unchanged from prior. No calvarial fracture or suspicious osseous lesion. No scalp swelling or hematoma. Sinuses/Orbits: Minimal mural thickening in the left maxillary, sphenoid and ethmoid sinuses. Some pneumatized secretions are present within the ethmoids as well. Bilateral lens extractions are noted. Left scleral buckle is seen. Orbits are otherwise unremarkable. Other: None IMPRESSION: 1. No acute intracranial abnormality. 2. A previously described 4 mm enhancing lesion in the right cerebellum is not well seen in the absence of contrast on today's study. No new intracranial lesions are seen. Electronically Signed   By: Lovena Le M.D.   On: 12/09/2018 02:29   Nm Bone Scan Whole Body  Result Date: 12/11/2018 CLINICAL DATA:  History of prostate cancer. EXAM: NUCLEAR MEDICINE WHOLE BODY BONE SCAN TECHNIQUE: Whole body anterior and posterior images were obtained approximately 3 hours after intravenous injection of radiopharmaceutical. RADIOPHARMACEUTICALS:  21.7 mCi Technetium-59mMDP IV COMPARISON:  PET scan of September 18, 2018. Radiographs of November 26, 2017. FINDINGS: Minimal abnormal uptake is seen involving both feet consistent with degenerative change.  Abnormal uptake is seen involving the right glenohumeral joint suggesting degenerative change. Degenerative changes seen involving the left knee. No other areas of abnormal uptake are noted. IMPRESSION: No definite scintigraphic evidence of osseous metastases. Electronically Signed   By: JMarijo ConceptionM.D.   On: 12/11/2018 15:19   UKoreaRenal  Result Date: 12/11/2018 CLINICAL DATA:  Acute renal insufficiency. EXAM: RENAL / URINARY TRACT ULTRASOUND COMPLETE COMPARISON:  CT of the abdomen December 09, 2018 FINDINGS: Right Kidney: Renal measurements: 11.1 x 6.2 x 5.7 cm = volume: 205 mL. Increased cortical echogenicity and mild cortical thinning. Complex cystic structure in the midpole region of the right kidney measures 2.7 x 2 cm. Additional 1 cm benign-appearing cyst in the upper pole of the right kidney. Left Kidney: Renal measurements: 13 x 5.6 x  6.3 cm = volume: 240 mL. Increased cortical echogenicity and mild cortical thinning. Simple appearing cyst in the midpole region of the left kidney measures 1.9 cm. Additional complex cystic structure in the lower pole of the left kidney measures 1.3 cm Bladder: Appears normal for degree of bladder distention. IMPRESSION: Increased cortical echogenicity and mild cortical thinning bilaterally. Bilateral complex and simple appearing renal cysts. Indention on future follow-up. Normal appearance of the urinary bladder. Electronically Signed   By: Fidela Salisbury M.D.   On: 12/11/2018 20:29   Dg Chest Port 1 View  Result Date: 12/09/2018 CLINICAL DATA:  Sepsis EXAM: PORTABLE CHEST 1 VIEW COMPARISON:  Radiograph 05/24/2017, CT chest 09/01/2015 FINDINGS: Postsurgical changes related to prior CABG including intact and aligned sternotomy wires and multiple surgical clips projecting over the mediastinum. Three lead pacer battery pack overlies the left chest wall with leads in the right atrium, cardiac apex and coronary sinus. Cardiac silhouette is enlarged accounting for  the portable technique. The aorta is calcified. There is tortuosity of the right brachiocephalic vessels as well. Calcifications noted at the carotid bifurcations. Lung volumes are diminished. Streaky opacities are present in the lung bases with more focal right infrahilar opacity. Pulmonary vascularity is somewhat cephalized. IMPRESSION: Findings are compatible with CHF/volume overload with mild edema and cardiomegaly in this patient with history of CABG. Early infectious process could have a similar appearance, particularly in the lung bases, and should be excluded on a clinical basis. Electronically Signed   By: Lovena Le M.D.   On: 12/09/2018 00:16   Dg Foot Complete Right  Result Date: 12/09/2018 CLINICAL DATA:  Possible sepsis, redness on foot EXAM: RIGHT FOOT COMPLETE - 3+ VIEW COMPARISON:  None. FINDINGS: Postsurgical changes from prior first tarsometatarsal arthrodesis secured by 2 fully threaded screws. An additional fully threaded screw at the anterior process of the calcaneus, likely related to prior ORIF, correlate with surgical history. Marked hallux valgus deformity across the first metatarsophalangeal joint with hypertrophic change. There is remote posttraumatic change of the second proximal middle phalanges with posttraumatic fusion. Additional fusion is seen across the fourth middle and distal phalanges. There is diffuse soft tissue swelling and vascular calcification. No convincing features of osteomyelitis such as cortical destruction, periostitis or erosive change though evaluation is limited by the background of extensive degenerative features throughout the foot. There is a small linear radiodensity in the soft tissues adjacent the base of the fifth proximal phalanx which could reflect a small foreign body. No subcutaneous gas. IMPRESSION: 1. No convincing features of osteomyelitis though evaluation is limited by the background of extensive degenerative changes and postsurgical features  throughout the foot. 2. Small linear radiodensity in the soft tissues adjacent to the base of the fifth proximal phalanx which could reflect a small foreign body. Electronically Signed   By: Lovena Le M.D.   On: 12/09/2018 00:22   Ct Renal Stone Study  Result Date: 12/09/2018 CLINICAL DATA:  Altered level of consciousness, sepsis EXAM: CT ABDOMEN AND PELVIS WITHOUT CONTRAST TECHNIQUE: Multidetector CT imaging of the abdomen and pelvis was performed following the standard protocol without IV contrast. COMPARISON:  Chest radiograph December 08, 2018, PET-CT September 18, 2018 FINDINGS: Lower chest: Minimal atelectatic changes in the lung bases. 3 lead pacer wires are better visualized on scout radiography. Normal heart size. No pericardial effusion. Hepatobiliary: No focal liver abnormality is seen. No gallstones, gallbladder wall thickening, or biliary dilatation. Pancreas: Mild atrophy of the pancreas. No peripancreatic inflammation or ductal dilatation.  Spleen: Normal in size without focal abnormality. Adrenals/Urinary Tract: Normal adrenal glands. Bilateral mild renal atrophy with cortical scarring in both kidneys. Mild bilateral nonspecific perinephric stranding, a nonspecific finding though may correlate with either age or decreased renal function. No visible urolithiasis. No hydronephrosis. No concerning renal masses. Urinary bladder is unremarkable. Stomach/Bowel: Distal esophagus, stomach and duodenal sweep are unremarkable. No bowel wall thickening or dilatation. No evidence of obstruction. Cecum is partially displaced into the right upper quadrant. Extensive pancolonic diverticulosis is present without focal pericolonic inflammatory features to suggest diverticulitis. Vascular/Lymphatic: Atherosclerotic plaque is present throughout the aorta. There is mild fusiform dilatation of the infrarenal abdominal aorta up to 3.1 cm in maximal diameter. Mild ectasia of the common iliacs is noted as well measuring up  to 2.3 cm on the left and 2.1 cm on the right. No suspicious or enlarged lymph nodes in the included lymphatic chains. Reproductive: The prostate and seminal vesicles are unremarkable. Other: No abdominopelvic free fluid or free gas. No bowel containing hernias. Small fat containing inguinal hernias are noted. Small fat containing umbilical hernia is present. Musculoskeletal: Multilevel degenerative changes are present in the imaged portions of the spine. There is focal levocurvature at L1-2 with partial fusion across the L1-2 levels. Discogenic and facet degenerative changes result in at least mild canal stenosis at L2-3. Moderate to severe foraminal narrowing is seen bilaterally at L4-5. No acute osseous abnormality or suspicious osseous lesion. IMPRESSION: 1. No acute intra-abdominal process. Specifically no evidence of urolithiasis or hydronephrosis. 2. Mild bilateral renal atrophy. 3. Extensive pancolonic diverticulosis without evidence of diverticulitis. 4. Mild fusiform dilatation of the infrarenal abdominal aorta up to 3.1 cm in maximal diameter. Mild ectasia of the common iliacs bilaterally. Recommend followup by ultrasound in 3 years. This recommendation follows ACR consensus guidelines: White Paper of the ACR Incidental Findings Committee II on Vascular Findings. J Am Coll Radiol 2013; 10:789-794. Aortic aneurysm NOS (ICD10-I71.9) 5. Aortic Atherosclerosis (ICD10-I70.0). Electronically Signed   By: Lovena Le M.D.   On: 12/09/2018 02:37    Microbiology: Recent Results (from the past 240 hour(s))  Blood Culture (routine x 2)     Status: None (Preliminary result)   Collection Time: 12/08/18 11:51 PM   Specimen: BLOOD  Result Value Ref Range Status   Specimen Description BLOOD LEFT ANTECUBITAL  Final   Special Requests   Final    BOTTLES DRAWN AEROBIC AND ANAEROBIC Blood Culture adequate volume   Culture   Final    NO GROWTH 3 DAYS Performed at [facility], [address],  [place], [place] [address]    Report Status PENDING  Incomplete  Blood Culture (routine x 2)     Status: None (Preliminary result)   Collection Time: 12/08/18 11:53 PM   Specimen: BLOOD  Result Value Ref Range Status   Specimen Description BLOOD RIGHT ANTECUBITAL  Final   Special Requests   Final    BOTTLES DRAWN AEROBIC AND ANAEROBIC Blood Culture results may not be optimal due to an excessive volume of blood received in culture bottles   Culture   Final    NO GROWTH 3 DAYS Performed at [facility], [place] [address] [place], [place] [address]    Report Status PENDING  Incomplete  SARS Coronavirus 2 [facility] order, Performed in [facility] hospital lab) Nasopharyngeal Nasopharyngeal Swab     Status: None   Collection Time: 12/09/18 12:24 AM   Specimen: Nasopharyngeal Swab  Result Value Ref Range Status  SARS Coronavirus 2 NEGATIVE NEGATIVE Final    Comment: (NOTE) If result is NEGATIVE SARS-CoV-2 target nucleic acids are NOT DETECTED. The SARS-CoV-2 RNA is generally detectable in upper and lower  respiratory specimens during the acute phase of infection. The lowest  concentration of SARS-CoV-2 viral copies this assay can detect is 250  copies / mL. A negative result does not preclude SARS-CoV-2 infection  and should not be used as the sole basis for treatment or other  patient management decisions.  A negative result may occur with  improper specimen collection / handling, submission of specimen other  than nasopharyngeal swab, presence of viral mutation(s) within the  areas targeted by this assay, and inadequate number of viral copies  (<250 copies / mL). A negative result must be combined with clinical  observations, patient history, and epidemiological information. If result is POSITIVE SARS-CoV-2 target nucleic acids are DETECTED. The SARS-CoV-2 RNA is generally detectable in upper and lower  respiratory specimens dur ing the acute phase of infection.  Positive  results  are indicative of active infection with SARS-CoV-2.  Clinical  correlation with patient history and other diagnostic information is  necessary to determine patient infection status.  Positive results do  not rule out bacterial infection or co-infection with other viruses. If result is PRESUMPTIVE POSTIVE SARS-CoV-2 nucleic acids MAY BE PRESENT.   A presumptive positive result was obtained on the submitted specimen  and confirmed on repeat testing.  While 2019 novel coronavirus  (SARS-CoV-2) nucleic acids may be present in the submitted sample  additional confirmatory testing may be necessary for epidemiological  and / or clinical management purposes  to differentiate between  SARS-CoV-2 and other Sarbecovirus currently known to infect humans.  If clinically indicated additional testing with an alternate test  methodology [phone]) is advised. The SARS-CoV-2 RNA is generally  detectable in upper and lower respiratory sp ecimens during the acute  phase of infection. The expected result is Negative. Fact Sheet for Patients:  StrictlyIdeas.no Fact Sheet for Healthcare Providers: BankingDealers.co.za This test is not yet approved or cleared by the [place] FDA and has been authorized for detection and/or diagnosis of SARS-CoV-2 by FDA under an Emergency Use Authorization (EUA).  This EUA will remain in effect (meaning this test can be used) for the duration of the COVID-19 declaration under Section 564(b)(1) of the Act, 21 U.S.C. section 360bbb-3(b)(1), unless the authorization is terminated or revoked sooner. Performed at [facility], [place] [address], [place], [place] [address]   Urine Culture     Status: None   Collection Time: 12/09/18  1:27 AM   Specimen: Urine, Clean Catch  Result Value Ref Range Status   Specimen Description URINE, CLEAN CATCH  Final   Special Requests NONE  Final   Culture   Final    NO GROWTH Performed  at [facility], [place] [address], [place], [place] [address]    Report Status 12/10/2018 FINAL  Final     Labs: Basic Metabolic Panel: Recent Labs  Lab 12/08/18 2353 12/09/18 0327 12/10/18 1100 12/11/18 0234 12/12/18 0309  NA 136 138 137 138 130*  K 4.6 4.6 4.1 4.3 4.3  CL 105 109 104 105 98  CO2 17* 17* 18* 18* 19*  GLUCOSE 171* 165* 193* 226* 256*  BUN 30* 29* 34* 45* 61*  CREATININE 2.04* 1.98* 2.09* 2.50* 2.60*  CALCIUM 8.8* 8.2* 8.5* 8.6* 8.0*   Liver Function Tests: Recent Labs  Lab 12/08/18 2353 12/09/18  0327  AST 18 16  ALT 17 16  ALKPHOS 44 44  BILITOT 0.9 0.9  PROT 6.6 5.9*  ALBUMIN 3.6 3.1*   No results for input(s): LIPASE, AMYLASE in the last 168 hours. No results for input(s): AMMONIA in the last 168 hours. CBC: Recent Labs  Lab 12/08/18 2353 12/09/18 0327 12/10/18 1100 12/11/18 0234 12/12/18 0309  WBC 1.5* 1.5* 2.1* 3.8* 13.2*  NEUTROABS 0.9*  --  1.3* 2.3 8.0*  HGB 12.3* 11.3* 11.9* 12.7* 10.5*  HCT 36.3* 34.2* 34.7* 37.5* 30.2*  MCV 96.5 98.3 95.1 95.4 93.2  PLT 118* 106* 129* 125* 112*   Cardiac Enzymes: No results for input(s): CKTOTAL, CKMB, CKMBINDEX, TROPONINI in the last 168 hours. BNP: BNP (last 3 results) No results for input(s): BNP in the last 8760 hours.  ProBNP (last 3 results) No results for input(s): PROBNP in the last 8760 hours.  CBG: No results for input(s): GLUCAP in the last 168 hours.     Signed:  Alma Friendly, MD [facility] 12/12/2018, 4:12 PM

## 2018-12-12 NOTE — Progress Notes (Signed)
Pt discharged back to [facility] and will have home PT and OT there. IV removed, pressure held until hemostasis. I discussed discharge instructions with patient and daughter; I answered questions.Pt in stable condition, taken out in wheelchair to meet daughter with private transport at front entrance.

## 2018-12-12 NOTE — TOC Initial Note (Signed)
Transition of Care (TOC) - Initial/Assessment Note    Patient Details  Name: David [name]. MRN: [id number] Date of Birth: [birth year]  Transition of Care [facility]) CM/SW Contact:    Benard Halsted, LCSW Phone Number: 12/12/2018, 3:45 PM  Clinical Narrative:                 CSW received consult for possible home health services at time of discharge. CSW spoke with patient regarding PT recommendation of Home Health PT at time of discharge. Patient reported that he resides at [place] with his wife. Patient's daughter also at bedside. She reported that they prefer to do PT at [facility] like patient has done before. Patient receives and aide through them twice a week as well. Per Seth Bake at [facility], patient will require outpatient PT/OT orders since they do not have home health services, only aides and their outpatient therapy program. CSW will page MD for orders. No further questions reported at this time. CSW to continue to follow and assist with discharge planning needs.   Expected Discharge Plan: OP Rehab Barriers to Discharge: No Barriers Identified   Patient Goals and CMS Choice Patient states their goals for this hospitalization and ongoing recovery are:: Return home to his wife CMS Medicare.gov Compare Post Acute Care list provided to:: Patient Choice offered to / list presented to : Patient  Expected Discharge Plan and Services Expected Discharge Plan: OP Rehab In-house Referral: Clinical Social Work Discharge Planning Services: CM Consult Post Acute Care Choice: (outpatient PT at [place]) Living arrangements for the past 2 months: [place]: NA [place] Agency: NA        Prior Living Arrangements/Services Living arrangements for the past 2 months: [place] Lives with:: Spouse Patient language and need for interpreter reviewed:: Yes Do you feel safe going back to the place  where you live?: Yes      Need for Family Participation in Patient Care: No (Comment) Care giver support system in place?: Yes (comment) Current home services: Homehealth aide Criminal Activity/Legal Involvement Pertinent to Current Situation/Hospitalization: No - Comment as needed  Activities of Daily Living Home Assistive Devices/Equipment: None ADL Screening (condition at time of admission) Patient's cognitive ability adequate to safely complete daily activities?: No Is the patient deaf or have difficulty hearing?: No Does the patient have difficulty seeing, even when wearing glasses/contacts?: No Does the patient have difficulty concentrating, remembering, or making decisions?: Yes Patient able to express need for assistance with ADLs?: Yes Does the patient have difficulty dressing or bathing?: No Independently performs ADLs?: Yes (appropriate for developmental age) Does the patient have difficulty walking or climbing stairs?: Yes Weakness of Legs: Both Weakness of Arms/Hands: None  Permission Sought/Granted Permission sought to share information with : Facility Sport and exercise psychologist, Family Supports Permission granted to share information with : Yes, Verbal Permission Granted  Share Information with NAME: Dr. [name]  Permission granted to share info w AGENCY: [place] granted to share info w Relationship: Daughter     Emotional Assessment Appearance:: Appears stated age Attitude/Demeanor/Rapport: Gracious, Engaged Affect (typically observed): Accepting, Appropriate Orientation: : Oriented to Self, Oriented to Place, Oriented to  Time, Oriented to Situation Alcohol / Substance Use: Not Applicable Psych Involvement: No (comment)  Admission diagnosis:  Erythema [L53.9] Sepsis with encephalopathy  without septic shock, due to unspecified organism ([place]) [A41.9, R65.20, G93.40] Sepsis ([place]) [A41.9] Patient Active Problem List   Diagnosis Date Noted  . Altered  mental status 12/09/2018  . Sepsis ([place]) 12/09/2018  . Fever 12/09/2018  . Neutropenia with fever ([place]) 12/09/2018  . Thrombocytopenia ([place]) 12/09/2018  . Acute pain of left wrist 11/15/2018  . Insomnia 10/14/2018  . Heart block AV complete ([place]) 10/01/2018  . Presence of biventricular cardiac pacemaker 10/01/2018  . Hypogonadism in male 04/18/2018  . Dandruff in adult 03/27/2018  . Dyslipidemia 03/27/2018  . Acute pain of right shoulder 03/11/2018  . Elevated PSA 12/24/2017  . Prediabetes 12/24/2017  . Hx of CABG 07/17/2017  . Alcohol use 04/19/2017  . Cognitive impairment 02/25/2017  . History of implantation of joint prosthesis of elbow 02/12/2017  . Osteoarthritis of knee 02/12/2017  . Hypercalcemia 11/01/2016  . Nontraumatic cortical hemorrhage of left cerebral hemisphere ([place]) 06/27/2016  . Hematochezia 05/06/2016  . Advanced care planning/counseling discussion 05/03/2016  . Atherosclerosis of native coronary artery of native heart with stable angina pectoris ([place]) 04/24/2016  . Ischemic cardiomyopathy 04/24/2016  . Acquired hypothyroidism 11/24/2015  . Metastatic melanoma to lung ([place])   . MDD (major depressive disorder), recurrent episode, moderate ([place])   . Benign prostatic hyperplasia   . Metastasis to brain ([place]) 10/15/2015  . Malignant melanoma ([place]) 09/22/2015  . Lung mass   . Chronic kidney disease, stage III (moderate) (HCC)   . Overweight with body mass index (BMI) 25.0-29.9 08/26/2015  . Incontinence 08/26/2015  . Hemorrhagic stroke ([place]) 08/26/2015  . Hemiplegia and hemiparesis following nontraumatic intracerebral hemorrhage affecting right dominant side ([place]) 08/26/2015  . Aphasia following nontraumatic intracerebral hemorrhage 08/26/2015  . Gait disturbance, post-stroke 08/26/2015  . Benign essential HTN   . OSA on CPAP   . Biventricular ICD (implantable cardioverter-defibrillator) in place   . ICH (intracerebral hemorrhage) (HCC) - L frontal due to HTN  vs CAA 08/22/2015   PCP:  [name], MD Pharmacy:   [place], [place] [facility] [place] [address] [place] [address] Phone: [phone] Fax: [phone]     Social Determinants of Health (SDOH) Interventions    Readmission Risk Interventions No flowsheet data found.

## 2018-12-12 NOTE — Progress Notes (Signed)
   12/11/18 2114  MEWS Score  Resp 18  Pulse Rate 75  BP 117/76  Temp 97.9 F (36.6 C)  SpO2 96 %  O2 Device Room Air  MEWS Score  MEWS RR 0  MEWS Pulse 0  MEWS Systolic 0  MEWS LOC 0  MEWS Temp 0  MEWS Score 0  MEWS Score Color Green  MEWS Assessment  Is this an acute change? No  MEWS Guidelines - (patients age 79 and over)  Red - At High Risk for Deterioration Yellow - At risk for Deterioration  1. Go to room and assess patient 2. Validate data. Is this patient's baseline? If data confirmed: 3. Is this an acute change? 4. Administer prn meds/treatments as ordered. 5. Note Sepsis score 6. Review goals of care 7. Sports coach, RRT nurse and Provider. 8. Ask Provider to come to bedside.  9. Document patient condition/interventions/response. 10. Increase frequency of vital signs and focused assessments to at least q15 minutes x 4, then q30 minutes x2. - If stable, then q1h x3, then q4h x3 and then q8h or dept. routine. - If unstable, contact Provider & RRT nurse. Prepare for possible transfer. 11. Add entry in progress notes using the smart phrase ".MEWS". 1. Go to room and assess patient 2. Validate data. Is this patient's baseline? If data confirmed: 3. Is this an acute change? 4. Administer prn meds/treatments as ordered? 5. Note Sepsis score 6. Review goals of care 7. Sports coach and Provider 8. Call RRT nurse as needed. 9. Document patient condition/interventions/response. 10. Increase frequency of vital signs and focused assessments to at least q2h x2. - If stable, then q4h x2 and then q8h or dept. routine. - If unstable, contact Provider & RRT nurse. Prepare for possible transfer. 11. Add entry in progress notes using the smart phrase ".MEWS".  Green - Likely stable [place] - Comfort Care Only  1. Continue routine/ordered monitoring.  2. Review goals of care. 1. Continue routine/ordered monitoring. 2. Review goals of care.

## 2018-12-12 NOTE — Plan of Care (Signed)

## 2018-12-13 ENCOUNTER — Telehealth: Payer: Self-pay

## 2018-12-13 DIAGNOSIS — C61 Malignant neoplasm of prostate: Secondary | ICD-10-CM | POA: Diagnosis not present

## 2018-12-13 NOTE — Telephone Encounter (Signed)
Transition Care Management Follow-up Telephone Call    Date discharged? 12/12/2018   How have you been since you were released from the hospital? Feeling better,    Do you understand why you were in the hospital? yes   Do you understand the discharge instructions? yes   Where were you discharged to? Home with his wife.    Items Reviewed:  Medications reviewed: yes  Allergies reviewed: yes  Dietary changes reviewed: yes  Referrals reviewed: yes   Functional Questionnaire:   Activities of Daily Living (ADLs):   He states they are independent in the following: ambulation, fixing medications, grooming, dressing, bathing, toileting. States they require assistance with the following:fixing food.   Any transportation issues/concerns?: no   Any patient concerns? Not at this time.   Confirmed importance and date/time of follow-up visits scheduled NO-patient states he will call back to schedule.  Confirmed with patient if condition begins to worsen call PCP or go to the ER.  Patient was given the office number and encouraged to call back with question or concerns.  : yes

## 2018-12-14 LAB — CULTURE, BLOOD (ROUTINE X 2)
Culture: NO GROWTH
Culture: NO GROWTH
Special Requests: ADEQUATE

## 2018-12-16 ENCOUNTER — Inpatient Hospital Stay (HOSPITAL_COMMUNITY)
Admission: EM | Admit: 2018-12-16 | Discharge: 2018-12-18 | DRG: 638 | Disposition: A | Discharge status: Rehabilitation Facility | Payer: Medicare Other | Attending: Internal Medicine | Admitting: Internal Medicine

## 2018-12-16 ENCOUNTER — Emergency Department (HOSPITAL_COMMUNITY): Payer: Medicare Other

## 2018-12-16 ENCOUNTER — Other Ambulatory Visit: Payer: Self-pay

## 2018-12-16 ENCOUNTER — Observation Stay (HOSPITAL_COMMUNITY): Payer: Medicare Other

## 2018-12-16 DIAGNOSIS — M109 Gout, unspecified: Secondary | ICD-10-CM | POA: Diagnosis present

## 2018-12-16 DIAGNOSIS — E1165 Type 2 diabetes mellitus with hyperglycemia: Secondary | ICD-10-CM | POA: Diagnosis not present

## 2018-12-16 DIAGNOSIS — I1310 Hypertensive heart and chronic kidney disease without heart failure, with stage 1 through stage 4 chronic kidney disease, or unspecified chronic kidney disease: Secondary | ICD-10-CM | POA: Diagnosis present

## 2018-12-16 DIAGNOSIS — R531 Weakness: Secondary | ICD-10-CM | POA: Diagnosis not present

## 2018-12-16 DIAGNOSIS — E1122 Type 2 diabetes mellitus with diabetic chronic kidney disease: Secondary | ICD-10-CM | POA: Diagnosis present

## 2018-12-16 DIAGNOSIS — M2540 Effusion, unspecified joint: Secondary | ICD-10-CM

## 2018-12-16 DIAGNOSIS — R739 Hyperglycemia, unspecified: Secondary | ICD-10-CM | POA: Diagnosis not present

## 2018-12-16 DIAGNOSIS — I2510 Atherosclerotic heart disease of native coronary artery without angina pectoris: Secondary | ICD-10-CM | POA: Diagnosis present

## 2018-12-16 DIAGNOSIS — K219 Gastro-esophageal reflux disease without esophagitis: Secondary | ICD-10-CM | POA: Diagnosis present

## 2018-12-16 DIAGNOSIS — I252 Old myocardial infarction: Secondary | ICD-10-CM

## 2018-12-16 DIAGNOSIS — E875 Hyperkalemia: Secondary | ICD-10-CM | POA: Diagnosis present

## 2018-12-16 DIAGNOSIS — D696 Thrombocytopenia, unspecified: Secondary | ICD-10-CM | POA: Diagnosis present

## 2018-12-16 DIAGNOSIS — Z8249 Family history of ischemic heart disease and other diseases of the circulatory system: Secondary | ICD-10-CM

## 2018-12-16 DIAGNOSIS — Z20828 Contact with and (suspected) exposure to other viral communicable diseases: Secondary | ICD-10-CM | POA: Diagnosis not present

## 2018-12-16 DIAGNOSIS — I10 Essential (primary) hypertension: Secondary | ICD-10-CM | POA: Diagnosis present

## 2018-12-16 DIAGNOSIS — Z79899 Other long term (current) drug therapy: Secondary | ICD-10-CM

## 2018-12-16 DIAGNOSIS — E7800 Pure hypercholesterolemia, unspecified: Secondary | ICD-10-CM | POA: Diagnosis present

## 2018-12-16 DIAGNOSIS — Z87891 Personal history of nicotine dependence: Secondary | ICD-10-CM

## 2018-12-16 DIAGNOSIS — Z951 Presence of aortocoronary bypass graft: Secondary | ICD-10-CM

## 2018-12-16 DIAGNOSIS — Z801 Family history of malignant neoplasm of trachea, bronchus and lung: Secondary | ICD-10-CM

## 2018-12-16 DIAGNOSIS — C61 Malignant neoplasm of prostate: Secondary | ICD-10-CM | POA: Diagnosis present

## 2018-12-16 DIAGNOSIS — N400 Enlarged prostate without lower urinary tract symptoms: Secondary | ICD-10-CM | POA: Diagnosis present

## 2018-12-16 DIAGNOSIS — I619 Nontraumatic intracerebral hemorrhage, unspecified: Secondary | ICD-10-CM | POA: Diagnosis present

## 2018-12-16 DIAGNOSIS — Z95810 Presence of automatic (implantable) cardiac defibrillator: Secondary | ICD-10-CM | POA: Diagnosis present

## 2018-12-16 DIAGNOSIS — C439 Malignant melanoma of skin, unspecified: Secondary | ICD-10-CM | POA: Diagnosis present

## 2018-12-16 DIAGNOSIS — Z7982 Long term (current) use of aspirin: Secondary | ICD-10-CM

## 2018-12-16 DIAGNOSIS — Z79890 Hormone replacement therapy (postmenopausal): Secondary | ICD-10-CM

## 2018-12-16 DIAGNOSIS — G4733 Obstructive sleep apnea (adult) (pediatric): Secondary | ICD-10-CM

## 2018-12-16 DIAGNOSIS — M19072 Primary osteoarthritis, left ankle and foot: Secondary | ICD-10-CM | POA: Diagnosis not present

## 2018-12-16 DIAGNOSIS — C7931 Secondary malignant neoplasm of brain: Secondary | ICD-10-CM | POA: Diagnosis present

## 2018-12-16 DIAGNOSIS — Z888 Allergy status to other drugs, medicaments and biological substances status: Secondary | ICD-10-CM

## 2018-12-16 DIAGNOSIS — N183 Chronic kidney disease, stage 3 (moderate): Secondary | ICD-10-CM | POA: Diagnosis present

## 2018-12-16 DIAGNOSIS — Z8261 Family history of arthritis: Secondary | ICD-10-CM

## 2018-12-16 DIAGNOSIS — E860 Dehydration: Secondary | ICD-10-CM | POA: Diagnosis present

## 2018-12-16 DIAGNOSIS — Z85820 Personal history of malignant melanoma of skin: Secondary | ICD-10-CM

## 2018-12-16 DIAGNOSIS — R0902 Hypoxemia: Secondary | ICD-10-CM | POA: Diagnosis not present

## 2018-12-16 DIAGNOSIS — I69120 Aphasia following nontraumatic intracerebral hemorrhage: Secondary | ICD-10-CM

## 2018-12-16 DIAGNOSIS — Z85118 Personal history of other malignant neoplasm of bronchus and lung: Secondary | ICD-10-CM

## 2018-12-16 DIAGNOSIS — Z85841 Personal history of malignant neoplasm of brain: Secondary | ICD-10-CM

## 2018-12-16 DIAGNOSIS — E785 Hyperlipidemia, unspecified: Secondary | ICD-10-CM | POA: Diagnosis present

## 2018-12-16 DIAGNOSIS — I69151 Hemiplegia and hemiparesis following nontraumatic intracerebral hemorrhage affecting right dominant side: Secondary | ICD-10-CM

## 2018-12-16 DIAGNOSIS — N179 Acute kidney failure, unspecified: Secondary | ICD-10-CM | POA: Diagnosis not present

## 2018-12-16 DIAGNOSIS — J9811 Atelectasis: Secondary | ICD-10-CM | POA: Diagnosis not present

## 2018-12-16 DIAGNOSIS — I959 Hypotension, unspecified: Secondary | ICD-10-CM | POA: Diagnosis not present

## 2018-12-16 DIAGNOSIS — E039 Hypothyroidism, unspecified: Secondary | ICD-10-CM | POA: Diagnosis present

## 2018-12-16 LAB — URINALYSIS, ROUTINE W REFLEX MICROSCOPIC
Bacteria, UA: NONE SEEN
Bilirubin Urine: NEGATIVE
Glucose, UA: 150 mg/dL — AB
Ketones, ur: NEGATIVE mg/dL
Leukocytes,Ua: NEGATIVE
Nitrite: NEGATIVE
Protein, ur: 100 mg/dL — AB
Specific Gravity, Urine: 1.015 (ref 1.005–1.030)
pH: 5 (ref 5.0–8.0)

## 2018-12-16 LAB — COMPREHENSIVE METABOLIC PANEL
ALT: 40 U/L (ref 0–44)
AST: 19 U/L (ref 15–41)
Albumin: 2.8 g/dL — ABNORMAL LOW (ref 3.5–5.0)
Alkaline Phosphatase: 49 U/L (ref 38–126)
Anion gap: 12 (ref 5–15)
BUN: 75 mg/dL — ABNORMAL HIGH (ref 8–23)
CO2: 18 mmol/L — ABNORMAL LOW (ref 22–32)
Calcium: 8.7 mg/dL — ABNORMAL LOW (ref 8.9–10.3)
Chloride: 107 mmol/L (ref 98–111)
Creatinine, Ser: 2.23 mg/dL — ABNORMAL HIGH (ref 0.61–1.24)
GFR calc Af Amer: 31 mL/min — ABNORMAL LOW (ref >60)
GFR calc non Af Amer: 27 mL/min — ABNORMAL LOW (ref >60)
Glucose, Bld: 225 mg/dL — ABNORMAL HIGH (ref 70–99)
Potassium: 4.6 mmol/L (ref 3.5–5.1)
Sodium: 137 mmol/L (ref 135–145)
Total Bilirubin: 0.6 mg/dL (ref 0.3–1.2)
Total Protein: 5.6 g/dL — ABNORMAL LOW (ref 6.5–8.1)

## 2018-12-16 LAB — POCT I-STAT EG7
Acid-base deficit: 6 mmol/L — ABNORMAL HIGH (ref 0.0–2.0)
Bicarbonate: 17.9 mmol/L — ABNORMAL LOW (ref 20.0–28.0)
Calcium, Ion: 1.24 mmol/L (ref 1.15–1.40)
HCT: 31 % — ABNORMAL LOW (ref 39.0–52.0)
Hemoglobin: 10.5 g/dL — ABNORMAL LOW (ref 13.0–17.0)
O2 Saturation: 83 %
Potassium: 4.5 mmol/L (ref 3.5–5.1)
Sodium: 137 mmol/L (ref 135–145)
TCO2: 19 mmol/L — ABNORMAL LOW (ref 22–32)
pCO2, Ven: 31.2 mmHg — ABNORMAL LOW (ref 44.0–60.0)
pH, Ven: 7.367 (ref 7.250–7.430)
pO2, Ven: 49 mmHg — ABNORMAL HIGH (ref 32.0–45.0)

## 2018-12-16 LAB — CBC
HCT: 32.5 % — ABNORMAL LOW (ref 39.0–52.0)
Hemoglobin: 10.9 g/dL — ABNORMAL LOW (ref 13.0–17.0)
MCH: 32.1 pg (ref 26.0–34.0)
MCHC: 33.5 g/dL (ref 30.0–36.0)
MCV: 95.6 fL (ref 80.0–100.0)
Platelets: 101 10*3/uL — ABNORMAL LOW (ref 150–400)
RBC: 3.4 MIL/uL — ABNORMAL LOW (ref 4.22–5.81)
RDW: 15.6 % — ABNORMAL HIGH (ref 11.5–15.5)
WBC: 26.6 10*3/uL — ABNORMAL HIGH (ref 4.0–10.5)
nRBC: 0.2 % (ref 0.0–0.2)

## 2018-12-16 LAB — CBC WITH DIFFERENTIAL/PLATELET
Band Neutrophils: 0 %
Basophils Absolute: 0 10*3/uL (ref 0.0–0.1)
Basophils Relative: 0 %
Blasts: 0 %
Eosinophils Absolute: 0 10*3/uL (ref 0.0–0.5)
Eosinophils Relative: 0 %
HCT: 32.5 % — ABNORMAL LOW (ref 39.0–52.0)
Hemoglobin: 11 g/dL — ABNORMAL LOW (ref 13.0–17.0)
Lymphocytes Relative: 46 %
Lymphs Abs: 11.2 10*3/uL — ABNORMAL HIGH (ref 0.7–4.0)
MCH: 32.7 pg (ref 26.0–34.0)
MCHC: 33.8 g/dL (ref 30.0–36.0)
MCV: 96.7 fL (ref 80.0–100.0)
Metamyelocytes Relative: 0 %
Monocytes Absolute: 1.5 10*3/uL — ABNORMAL HIGH (ref 0.1–1.0)
Monocytes Relative: 6 %
Myelocytes: 0 %
Neutro Abs: 11.7 10*3/uL — ABNORMAL HIGH (ref 1.7–7.7)
Neutrophils Relative %: 48 %
Other: 0 %
Platelets: 97 10*3/uL — ABNORMAL LOW (ref 150–400)
Promyelocytes Relative: 0 %
RBC: 3.36 MIL/uL — ABNORMAL LOW (ref 4.22–5.81)
RDW: 15.5 % (ref 11.5–15.5)
WBC: 24.4 10*3/uL — ABNORMAL HIGH (ref 4.0–10.5)
nRBC: 0 /100 WBC
nRBC: 0.3 % — ABNORMAL HIGH (ref 0.0–0.2)

## 2018-12-16 LAB — CREATININE, SERUM
Creatinine, Ser: 2.02 mg/dL — ABNORMAL HIGH (ref 0.61–1.24)
GFR calc Af Amer: 35 mL/min — ABNORMAL LOW (ref >60)
GFR calc non Af Amer: 30 mL/min — ABNORMAL LOW (ref >60)

## 2018-12-16 LAB — TROPONIN I (HIGH SENSITIVITY): Troponin I (High Sensitivity): 28 ng/L — ABNORMAL HIGH (ref <18)

## 2018-12-16 LAB — CK: Total CK: 19 U/L — ABNORMAL LOW (ref 49–397)

## 2018-12-16 LAB — HEMOGLOBIN A1C
Hgb A1c MFr Bld: 7.4 % — ABNORMAL HIGH (ref 4.8–5.6)
Mean Plasma Glucose: 165.68 mg/dL

## 2018-12-16 LAB — MAGNESIUM: Magnesium: 2 mg/dL (ref 1.7–2.4)

## 2018-12-16 LAB — TSH: TSH: 10.506 u[IU]/mL — ABNORMAL HIGH (ref 0.350–4.500)

## 2018-12-16 LAB — C-REACTIVE PROTEIN: CRP: 16.3 mg/dL — ABNORMAL HIGH (ref <1.0)

## 2018-12-16 LAB — LACTIC ACID, PLASMA: Lactic Acid, Venous: 1 mmol/L (ref 0.5–1.9)

## 2018-12-16 LAB — GLUCOSE, CAPILLARY: Glucose-Capillary: 184 mg/dL — ABNORMAL HIGH (ref 70–99)

## 2018-12-16 LAB — PHOSPHORUS: Phosphorus: 4 mg/dL (ref 2.5–4.6)

## 2018-12-16 LAB — SEDIMENTATION RATE: Sed Rate: 22 mm/hr — ABNORMAL HIGH (ref 0–16)

## 2018-12-16 MED ORDER — LEVOTHYROXINE SODIUM 25 MCG PO TABS
137.0000 ug | ORAL_TABLET | Freq: Every day | ORAL | Status: DC
  Administered 2018-12-17 – 2018-12-18 (×2): 137 ug via ORAL
  Filled 2018-12-16 (×2): qty 1

## 2018-12-16 MED ORDER — PREDNISONE 20 MG PO TABS
40.0000 mg | ORAL_TABLET | Freq: Every day | ORAL | Status: DC
  Administered 2018-12-17 – 2018-12-18 (×2): 40 mg via ORAL
  Filled 2018-12-16 (×2): qty 2

## 2018-12-16 MED ORDER — ONDANSETRON HCL 4 MG/2ML IJ SOLN: 4.0000 mg | Freq: Four times a day (QID) | INTRAMUSCULAR | Status: DC | PRN

## 2018-12-16 MED ORDER — ACETAMINOPHEN 325 MG PO TABS
650.0000 mg | ORAL_TABLET | Freq: Four times a day (QID) | ORAL | Status: DC | PRN
  Administered 2018-12-16: 650 mg via ORAL
  Filled 2018-12-16: qty 2

## 2018-12-16 MED ORDER — ONDANSETRON HCL 4 MG PO TABS: 4.0000 mg | ORAL_TABLET | Freq: Four times a day (QID) | ORAL | Status: DC | PRN

## 2018-12-16 MED ORDER — ENOXAPARIN SODIUM 30 MG/0.3ML ~~LOC~~ SOLN
30.0000 mg | SUBCUTANEOUS | Status: DC
  Administered 2018-12-16: 30 mg via SUBCUTANEOUS
  Filled 2018-12-16: qty 0.3

## 2018-12-16 MED ORDER — ROSUVASTATIN CALCIUM 20 MG PO TABS
20.0000 mg | ORAL_TABLET | Freq: Every day | ORAL | Status: DC
  Administered 2018-12-16 – 2018-12-17 (×2): 20 mg via ORAL
  Filled 2018-12-16 (×2): qty 1

## 2018-12-16 MED ORDER — CITALOPRAM HYDROBROMIDE 10 MG PO TABS
10.0000 mg | ORAL_TABLET | Freq: Every day | ORAL | Status: DC
  Administered 2018-12-16 – 2018-12-18 (×3): 10 mg via ORAL
  Filled 2018-12-16 (×3): qty 1

## 2018-12-16 MED ORDER — ASPIRIN EC 81 MG PO TBEC
81.0000 mg | DELAYED_RELEASE_TABLET | Freq: Every evening | ORAL | Status: DC
  Administered 2018-12-16 – 2018-12-17 (×2): 81 mg via ORAL
  Filled 2018-12-16 (×2): qty 1

## 2018-12-16 MED ORDER — ACETAMINOPHEN 650 MG RE SUPP: 650.0000 mg | Freq: Four times a day (QID) | RECTAL | Status: DC | PRN

## 2018-12-16 MED ORDER — MORPHINE SULFATE (PF) 4 MG/ML IV SOLN
4.0000 mg | Freq: Four times a day (QID) | INTRAVENOUS | Status: DC | PRN
  Administered 2018-12-16 – 2018-12-17 (×2): 4 mg via INTRAVENOUS
  Filled 2018-12-16 (×2): qty 1

## 2018-12-16 MED ORDER — SODIUM CHLORIDE 0.9 % IV BOLUS
1000.0000 mL | Freq: Once | INTRAVENOUS | Status: AC
  Administered 2018-12-16: 1000 mL via INTRAVENOUS

## 2018-12-16 MED ORDER — TRAZODONE HCL 50 MG PO TABS
25.0000 mg | ORAL_TABLET | Freq: Every evening | ORAL | Status: DC | PRN
  Administered 2018-12-16: 25 mg via ORAL
  Filled 2018-12-16: qty 1

## 2018-12-16 MED ORDER — PREDNISONE 20 MG PO TABS
60.0000 mg | ORAL_TABLET | Freq: Once | ORAL | Status: AC
  Administered 2018-12-16: 60 mg via ORAL
  Filled 2018-12-16: qty 3

## 2018-12-16 MED ORDER — MORPHINE SULFATE (PF) 4 MG/ML IV SOLN
4.0000 mg | Freq: Once | INTRAVENOUS | Status: AC
  Administered 2018-12-16: 4 mg via INTRAVENOUS
  Filled 2018-12-16: qty 1

## 2018-12-16 MED ORDER — ONDANSETRON HCL 4 MG/2ML IJ SOLN
4.0000 mg | Freq: Once | INTRAMUSCULAR | Status: AC
  Administered 2018-12-16: 16:00:00 4 mg via INTRAVENOUS
  Filled 2018-12-16: qty 2

## 2018-12-16 MED ORDER — INSULIN ASPART 100 UNIT/ML ~~LOC~~ SOLN
0.0000 [IU] | Freq: Every day | SUBCUTANEOUS | Status: DC
  Administered 2018-12-17: 5 [IU] via SUBCUTANEOUS

## 2018-12-16 MED ORDER — SODIUM CHLORIDE 0.9 % IV SOLN
INTRAVENOUS | Status: DC
  Administered 2018-12-16 – 2018-12-17 (×4): via INTRAVENOUS

## 2018-12-16 MED ORDER — AMLODIPINE BESYLATE 5 MG PO TABS
5.0000 mg | ORAL_TABLET | Freq: Every day | ORAL | Status: DC
  Administered 2018-12-16 – 2018-12-18 (×3): 5 mg via ORAL
  Filled 2018-12-16 (×3): qty 1

## 2018-12-16 MED ORDER — METOPROLOL SUCCINATE ER 25 MG PO TB24
75.0000 mg | ORAL_TABLET | Freq: Two times a day (BID) | ORAL | Status: DC
  Administered 2018-12-16 – 2018-12-18 (×4): 75 mg via ORAL
  Filled 2018-12-16 (×4): qty 3

## 2018-12-16 MED ORDER — INSULIN ASPART 100 UNIT/ML ~~LOC~~ SOLN
0.0000 [IU] | Freq: Three times a day (TID) | SUBCUTANEOUS | Status: DC
  Administered 2018-12-17: 18:00:00 8 [IU] via SUBCUTANEOUS
  Administered 2018-12-17 (×2): 3 [IU] via SUBCUTANEOUS
  Administered 2018-12-18: 2 [IU] via SUBCUTANEOUS

## 2018-12-16 MED ORDER — ACETAMINOPHEN ER 650 MG PO TBCR: 650.0000 mg | EXTENDED_RELEASE_TABLET | Freq: Three times a day (TID) | ORAL | Status: DC | PRN

## 2018-12-16 NOTE — Progress Notes (Signed)
Consult request has been received. CSW attempting to follow up at present time  [facility]  Transitions of Care Department  Clinical Social Worker  Ph: [phone]

## 2018-12-16 NOTE — H&P (Signed)
History and Physical    David [name]. ZOX:[id number] DOB: May 25, 1940 DOA: 12/16/2018  PCP: [name], MD  Patient coming from: Home  I have personally briefly reviewed patient's old medical records in [place]  Chief Complaint: Polyuria, polydipsia, generalized weakness and generalized body ache since 3 days.  HPI: David [name]. is a 78 y.o. male with medical history significant of recently diagnosed prostate cancer, melanoma with brain mets complicated by leukopenia, thrombocytopenia and ICH, hypothyroidism, hypertension, hyperlipidemia, coronary artery disease status post CABG brought by his daughter to emergency department with above-mentioned complaints.  Patient recently hospitalized on 12/08/2018 for  right foot cellulitis, pancytopenia (received Granix on 12/10/2018), left wrist gout. Pt received IV antibiotics and p.o. steroids and discharged home on p.o. doxycycline for 3 days.  Daughter reports that patient was doing fine after the discharge from the hospital however since 3 days has been peeing a lot, with  progressively declining energy, lethargic, very weak and unable to sit on the chair, his BS this am was in 400s. Patient reports that his right foot pain and left wrist pain has resolved however since 3 days he has noticed left foot pain which is severe in intensity, swollen, denies association with redness, trauma, fever or chills.  Patient denies recent exposure to COVID-19, headache, blurry vision, lightheadedness, dizziness, cough, congestion, nausea, vomiting, epigastric pain, hematemesis, black stool, urinary, bowel incontinence, leg swelling, chest pain, shortness of breath, palpitation, sleep or appetite changes.   ED Course: Patient appears lethargic, weak, complaining of generalized body ache and weakness.  Vitals stable. CBC shows elevated white count, blood sugar: 225, A1c 7.4, platelet count 97.   Received IV fluids, morphine and  prednisone 60 mg p.o. once in the ED.   Review of Systems: As per HPI otherwise negative.    Past Medical History:  Diagnosis Date  . Anginal pain ([place])   . Arthritis    mostly hands  . Benign familial hematuria   . BPH (benign prostatic hypertrophy)   . Brain cancer ([place])    melanoma with brain met  . Coronary artery disease   . GERD (gastroesophageal reflux disease)   . High cholesterol   . Hypertension   . Hypothyroidism   . Intracerebral hemorrhage ([place])   . Melanoma ([place])   . Metastatic melanoma to lung ([place])   . Mood disorder ([place])   . Myocardial infarction ([place])   . Pacemaker    due to syncope, 3rd degree HB (upgrade to [facility]. Jude CRT-P 02/25/13 (Dr. Uvaldo Rising)  . Rectal bleed    due to NSAIDS  . Renal disorder   . Sepsis ([place]) 12/09/2018  . Skin cancer    melanoma  . Sleep apnea    uses CPAP  . Stroke [facility])     Past Surgical History:  Procedure Laterality Date  . APPLICATION OF CRANIAL NAVIGATION N/A 10/15/2015   Procedure: APPLICATION OF CRANIAL NAVIGATION;  Surgeon: Erline Levine, MD;  Location: [place] NEURO ORS;  Service: Neurosurgery;  Laterality: N/A;  . CARDIAC CATHETERIZATION  2013  . CARDIAC SURGERY     bypass X 2  . COLONOSCOPY WITH PROPOFOL N/A 05/08/2016   diverticulosis, int hem, no f/u needed [name])  . CORONARY ARTERY BYPASS GRAFT  2013   LIMA-LAD, SVG-PDA 03/27/11 (Dr. Francee Gentile)  . CRANIOTOMY N/A 10/15/2015   Procedure: LEFT FRONTAL CRANIOTOMY TUMOR EXCISION with Curve;  Surgeon: Erline Levine, MD;  Location: [place] NEURO ORS;  Service: Neurosurgery;  Laterality: N/A;  CRANIOTOMY TUMOR EXCISION  . EYE SURGERY    . FOOT SURGERY  08/2010  . JOINT REPLACEMENT Left    partial knee  . KNEE ARTHROSCOPY  12/2008  . LYMPH NODE BIOPSY    . PACEMAKER INSERTION    . TONSILLECTOMY       reports that he quit smoking about 61 years ago. He has a 3.00 pack-year smoking history. He has never used smokeless tobacco. He reports current alcohol use of about 14.0 standard  drinks of alcohol per week. He reports that he does not use drugs.  Allergies  Allergen Reactions  . Ezetimibe Other (See Comments)    unknown  . Simvastatin Other (See Comments)    Reaction:  Gave pt a fever  Fever - temp of 103.   In 2003    Family History  Problem Relation Age of Onset  . Cancer Mother 17       lung   . Hypertension Mother   . Hypertension Father   . Heart attack Father   . Heart disease Father   . Rheum arthritis Sister   . Cancer Maternal Grandmother     Prior to Admission medications   Medication Sig Start Date End Date Taking? Authorizing Provider  acetaminophen (TYLENOL) 500 MG tablet Take 500 mg by mouth See admin instructions. Take 1 tablet three times daily as needed for pain and 1 tablet every night at bedtime    [provider]  amLODipine (NORVASC) 5 MG tablet TAKE ONE TABLET BY MOUTH EVERY DAY Patient taking differently: Take 5 mg by mouth daily.  01/02/18   [name], MD  aspirin EC 81 MG tablet Take 81 mg by mouth daily.    [provider]  Cholecalciferol (VITAMIN D3) 1000 units CAPS Take 1 capsule (1,000 Units total) by mouth daily. 12/24/17   [name], MD  citalopram (CELEXA) 10 MG tablet Take 1 tablet (10 mg total) by mouth daily. 10/07/18   [name], MD  levothyroxine (SYNTHROID, LEVOTHROID) 137 MCG tablet TAKE 1 TABLET BY MOUTH ONCE DAILY BEFORE BREAKFAST Patient taking differently: Take 137 mcg by mouth daily before breakfast.  12/24/17   [name], MD  methylphenidate (RITALIN) 10 MG tablet Take 1 tablet (10 mg total) by mouth daily. Patient taking differently: Take 10 mg by mouth daily as needed (when needing to concentrate).  04/01/18   Ventura Sellers, MD  metoprolol succinate (TOPROL-XL) 50 MG 24 hr tablet TAKE 1 AND 1/2 TABLET (75 MG) BY MOUTH EVERY DAY WITH OR IMMEDIATELY FOLLOWING A MEAL *DO NOT CRUSH* 01/02/18   [name], MD  rosuvastatin (CRESTOR) 20 MG tablet TAKE ONE  TABLET BY MOUTH AT BEDTIME Patient taking differently: Take 20 mg by mouth daily.  11/08/17   Minna Merritts, MD  traZODone (DESYREL) 50 MG tablet Take 0.5-1 tablets (25-50 mg total) by mouth at bedtime. 10/14/18   [name], MD  Turmeric 500 MG CAPS Take 2 capsules by mouth daily.    [provider]  vitamin E (VITAMIN E) 400 UNIT capsule Take 1 capsule (400 Units total) by mouth 2 (two) times daily. 07/24/16   Hayden Pedro, PA-C    Physical Exam: Vitals:   12/16/18 1630 12/16/18 1644 12/16/18 1645 12/16/18 1700  BP: 128/77  130/74 129/73  Pulse: 78  75 75  Resp: 18  (!) 22 18  Temp:  99.1 F (37.3 C)    TempSrc:  Rectal    SpO2: 97%  97%  94%  Weight:      Height:        Constitutional: NAD, calm, comfortable Vitals:   12/16/18 1630 12/16/18 1644 12/16/18 1645 12/16/18 1700  BP: 128/77  130/74 129/73  Pulse: 78  75 75  Resp: 18  (!) 22 18  Temp:  99.1 F (37.3 C)    TempSrc:  Rectal    SpO2: 97%  97% 94%  Weight:      Height:       Constitutional: Patient appears lethargic, weak, communicating well, alert and oriented x3.   Eyes: PERRL, lids and conjunctivae normal ENMT: Mucous membranes are moist. Posterior pharynx clear of any exudate or lesions.Normal dentition.  Neck: normal, supple, no masses, no thyromegaly Respiratory: clear to auscultation bilaterally, no wheezing, no crackles. Normal respiratory effort. No accessory muscle use.  Cardiovascular: Regular rate and rhythm, no murmurs / rubs / gallops. No extremity edema. 2+ pedal pulses. No carotid bruits.  Abdomen: no tenderness, no masses palpated. No hepatosplenomegaly. Bowel sounds positive.  Musculoskeletal: Left foot: Swollen, no redness, tenderness noted on anterior aspect, unable to perform range of motion due to pain. Skin: no rashes, lesions, ulcers. No induration Neurologic: CN 2-12 grossly intact. Sensation intact, DTR normal. Strength 5/5 in all 4.  Psychiatric: Normal judgment  and insight. Alert and oriented x 3. Normal mood.    Labs on Admission: I have personally reviewed following labs and imaging studies  CBC: Recent Labs  Lab 12/10/18 1100 12/11/18 0234 12/12/18 0309 12/16/18 1540 12/16/18 1559  WBC 2.1* 3.8* 13.2* 24.4*  --   NEUTROABS 1.3* 2.3 8.0* 11.7*  --   HGB 11.9* 12.7* 10.5* 11.0* 10.5*  HCT 34.7* 37.5* 30.2* 32.5* 31.0*  MCV 95.1 95.4 93.2 96.7  --   PLT 129* 125* 112* 97*  --    Basic Metabolic Panel: Recent Labs  Lab 12/10/18 1100 12/11/18 0234 12/12/18 0309 12/16/18 1540 12/16/18 1559  NA 137 138 130* 137 137  K 4.1 4.3 4.3 4.6 4.5  CL 104 105 98 107  --   CO2 18* 18* 19* 18*  --   GLUCOSE 193* 226* 256* 225*  --   BUN 34* 45* 61* 75*  --   CREATININE 2.09* 2.50* 2.60* 2.23*  --   CALCIUM 8.5* 8.6* 8.0* 8.7*  --    GFR: Estimated Creatinine Clearance: 29.9 mL/min (A) (by C-G formula based on SCr of 2.23 mg/dL (H)). Liver Function Tests: Recent Labs  Lab 12/16/18 1540  AST 19  ALT 40  ALKPHOS 49  BILITOT 0.6  PROT 5.6*  ALBUMIN 2.8*   No results for input(s): LIPASE, AMYLASE in the last 168 hours. No results for input(s): AMMONIA in the last 168 hours. Coagulation Profile: No results for input(s): INR, PROTIME in the last 168 hours. Cardiac Enzymes: Recent Labs  Lab 12/16/18 1540  CKTOTAL 19*   BNP (last 3 results) No results for input(s): PROBNP in the last 8760 hours. HbA1C: Recent Labs    12/16/18 1540  HGBA1C 7.4*   CBG: No results for input(s): GLUCAP in the last 168 hours. Lipid Profile: No results for input(s): CHOL, HDL, LDLCALC, TRIG, CHOLHDL, LDLDIRECT in the last 72 hours. Thyroid Function Tests: No results for input(s): TSH, T4TOTAL, FREET4, T3FREE, THYROIDAB in the last 72 hours. Anemia Panel: No results for input(s): VITAMINB12, FOLATE, FERRITIN, TIBC, IRON, RETICCTPCT in the last 72 hours. Urine analysis:    Component Value Date/Time   COLORURINE YELLOW 12/16/2018 1530  APPEARANCEUR HAZY (A) 12/16/2018 1530   LABSPEC 1.015 12/16/2018 1530   PHURINE 5.0 12/16/2018 1530   GLUCOSEU 150 (A) 12/16/2018 1530   HGBUR LARGE (A) 12/16/2018 1530   BILIRUBINUR NEGATIVE 12/16/2018 [place] 12/16/2018 1530   PROTEINUR 100 (A) 12/16/2018 1530   NITRITE NEGATIVE 12/16/2018 1530   LEUKOCYTESUR NEGATIVE 12/16/2018 1530    Radiological Exams on Admission: Dg Chest Port 1 View  Result Date: 12/16/2018 CLINICAL DATA:  Brought in from home by EMS for weakness. Pt was discharged several days ago for sepsis d/t cellulitis in right foot. Daughter states he has gotten increasingly weak and yesterday started voiding large amounts. EXAM: PORTABLE CHEST 1 VIEW COMPARISON:  12/08/2018 FINDINGS: LEFT-sided transvenous pacemaker leads overlie the RIGHT atrium, coronary sinus, and RIGHT ventricle. Median sternotomy and CABG. The heart is normal in size. There are no focal consolidations or pleural effusions. Subsegmental atelectasis identified at the LEFT lung base. There is no pulmonary edema. IMPRESSION: No active disease. Electronically Signed   By: Nolon Nations M.D.   On: 12/16/2018 15:16    Assessment/Plan Principal Problem:   Weakness generalized Active Problems:   Benign essential HTN   OSA on CPAP   Biventricular ICD (implantable cardioverter-defibrillator) in place   Hemorrhagic stroke ([place])   Malignant melanoma ([place])   Metastasis to brain ([facility])   Thrombocytopenia ([facility])    Generalized weakness: -Associated with polyuria, polydipsia, generalized body ache -Recently diagnosed with prostate cancer, history of metastatic melanoma with mets and brain -Symptoms likely secondary to dehydration-secondary to polyuria due to new onset diabetes mellitus. Bladder scan: No residual urine. -COVID-19-pending, chest x-ray is negative, UA: Negative for infection, CK: WNL.  Patient is afebrile, vitals stable -Placed patient under observation. -Received IV fluids in  the ED-we will continue same -Monitor vitals, consulted PT to eval and treat.  New onset of diabetes mellitus: -Blood sugar: 225, A1c 7.4%, not in DKA, anion gap is 12 -Continue IV fluids -Started sliding scale insulin, monitor blood sugar closely.  Left foot pain and swelling: -Likely secondary to gout -Ordered x-ray, CRP and sed rate -Started on prednisone 40 mg p.o. daily for 5 days  Leukocytosis: -Likely secondary to recent Granix injection  which he received due to leukopenia VS steroids for gout -No signs of infection, patient is afebrile, vitals stable, chest x-ray is negative, UA is negative -Ordered blood culture and lactic acid level -Will not start patient on antibiotics at this time.  Hypothyroidism: We will check TSH -Resume Synthyroid 137 mcg once daily. Coronary artery disease status post CABG: -Stable.  Patient is not complaining of chest symptoms -EKG: Paced, regular rhythm, right bundle branch block, no acute ST-T wave changes noted.  Troponin ordered-pending. -We will continue aspirin, statin, beta-blocker. Right foot cellulitis: Resolved -Finished p.o. antibiotics as prescribed at the time of discharge from the hospital  Hypertension: Blood pressure is well controlled -We will continue his home medication amlodipine and beta-blocker and monitor his blood pressure closely.  CKD stage IIIb:-Stable  -Baseline creatinine 1.5, admitted with creatinine of 2.23 -We will continue IV fluids and monitor his kidney function closely. -Avoid nephrotoxic medication  Malignant t melanoma with metastasis to brain: -Follows Dr. Burr Medico & Dr. Mickeal Skinner -Diagnosed in 2017, status post left frontal craniotomy, tumor excision and XRT to the brain and chemotherapy.  Hyperlipidemia: -We will continue his home medicine  DVT prophylaxis: TED/SCD, Lovenox Code Status: Full Code Family Communication: Daughter present at bedside.  Plan of care discussed with  patient and daughter in  length and he verbalized understanding and agreed with it. Disposition Plan: To be determined Consults called: None  admission status: Observation   Mckinley Jewel MD [facility] Pager [phone]  If 7PM-7AM, please contact night-coverage [email or web address] Password [facility]  12/16/2018, 6:03 PM

## 2018-12-16 NOTE — ED Provider Notes (Addendum)
[facility] EMERGENCY DEPARTMENT Provider Note   CSN: [id number] Arrival date & time: 12/16/18  1409     History   Chief Complaint Chief Complaint  Patient presents with   Weakness    HPI David [name]. is a 78 y.o. male.     Pt is a 78y/o male recently diagnosed prostate cancer, not receiving treatment at this time as still undergoing evaluation, melanoma in remission, intracerebral hemorrhage, pacemaker, gout, CAD status post CABG, rectal bleeding, who was recently admitted to the hospital last week for cellulitis, fever receiving IV antibiotics and discharged home on Thursday.  Daughter states that on Thursday when he was discharged home he was doing very well.  During his hospitalization he did start having pain and swelling in his left foot was thought to be related to gout and improved with steroids.  Prior to being discharged from the hospital he was doing laps around the nurses station and chatty and back to his normal self.  Daughter states he got home walked up the stairs and got in the house with no problem but then Friday started having more diffuse pain and generalized weakness.  It continued yesterday and then today was not able to even get out of bed due to generalized weakness, diffuse pain but worsened in the left lower extremity and also polyuria and polydipsia.  Daughter states that while hospitalized the patient did have AKI with a creatinine jumped to 2.6 but was planning on following up with nephrology as an outpatient.  He also has leukopenia that has not been fully worked up but did receive a shot similar to Neupogen while hospitalized.  Patient states he has had a cough but does not think it is any different he denies shortness of breath.  He has no abdominal pain, diarrhea or dysuria.  He continues to have pain but now in the left foot with some swelling.  He also feels like he is not completely emptying his bladder.  No altered mental status at  this time per family.  The history is provided by the patient and a relative.  Weakness   Past Medical History:  Diagnosis Date   Anginal pain ([place])    Arthritis    mostly hands   Benign familial hematuria    BPH (benign prostatic hypertrophy)    Brain cancer (HCC)    melanoma with brain met   Coronary artery disease    GERD (gastroesophageal reflux disease)    High cholesterol    Hypertension    Hypothyroidism    Intracerebral hemorrhage (HCC)    Melanoma (HCC)    Metastatic melanoma to lung ([place])    Mood disorder ([place])    Myocardial infarction [facility])    Pacemaker    due to syncope, 3rd degree HB (upgrade to [employer] CRT-P 02/25/13 (Dr. Uvaldo Rising)   Rectal bleed    due to NSAIDS   Renal disorder    Sepsis ([place]) 12/09/2018   Skin cancer    melanoma   Sleep apnea    uses CPAP   Stroke [facility])     Patient Active Problem List   Diagnosis Date Noted   Altered mental status 12/09/2018   Sepsis ([place]) 12/09/2018   Fever 12/09/2018   Neutropenia with fever ([place]) 12/09/2018   Thrombocytopenia ([place]) 12/09/2018   Acute pain of left wrist 11/15/2018   Insomnia 10/14/2018   Heart block AV complete ([place]) 10/01/2018   Presence of biventricular cardiac pacemaker 10/01/2018  Hypogonadism in male 04/18/2018   Dandruff in adult 03/27/2018   Dyslipidemia 03/27/2018   Acute pain of right shoulder 03/11/2018   Elevated PSA 12/24/2017   Prediabetes 12/24/2017   Hx of CABG 07/17/2017   Alcohol use 04/19/2017   Cognitive impairment 02/25/2017   History of implantation of joint prosthesis of elbow 02/12/2017   Osteoarthritis of knee 02/12/2017   Hypercalcemia 11/01/2016   Nontraumatic cortical hemorrhage of left cerebral hemisphere [facility]) 06/27/2016   Hematochezia 05/06/2016   Advanced care planning/counseling discussion 05/03/2016   Atherosclerosis of native coronary artery of native heart with stable angina pectoris ([place]) 04/24/2016    Ischemic cardiomyopathy 04/24/2016   Acquired hypothyroidism 11/24/2015   Metastatic melanoma to lung [facility])    MDD (major depressive disorder), recurrent episode, moderate (HCC)    Benign prostatic hyperplasia    Metastasis to brain ([place]) 10/15/2015   Malignant melanoma ([place]) 09/22/2015   Lung mass    Chronic kidney disease, stage III (moderate) (HCC)    Overweight with body mass index (BMI) 25.0-29.9 08/26/2015   Incontinence 08/26/2015   Hemorrhagic stroke ([place]) 08/26/2015   Hemiplegia and hemiparesis following nontraumatic intracerebral hemorrhage affecting right dominant side ([place]) 08/26/2015   Aphasia following nontraumatic intracerebral hemorrhage 08/26/2015   Gait disturbance, post-stroke 08/26/2015   Benign essential HTN    OSA on CPAP    Biventricular ICD (implantable cardioverter-defibrillator) in place    ICH (intracerebral hemorrhage) ([place]) - L frontal due to HTN vs CAA 08/22/2015    Past Surgical History:  Procedure Laterality Date   APPLICATION OF CRANIAL NAVIGATION N/A 10/15/2015   Procedure: APPLICATION OF CRANIAL NAVIGATION;  Surgeon: Erline Levine, MD;  Location: [facility] NEURO ORS;  Service: Neurosurgery;  Laterality: N/A;   CARDIAC CATHETERIZATION  2013   CARDIAC SURGERY     bypass X 2   COLONOSCOPY WITH PROPOFOL N/A 05/08/2016   diverticulosis, int hem, no f/u needed [name])   CORONARY ARTERY BYPASS GRAFT  2013   LIMA-LAD, SVG-PDA 03/27/11 (Dr. Francee Gentile)   [place] N/A 10/15/2015   Procedure: LEFT FRONTAL CRANIOTOMY TUMOR EXCISION with Curve;  Surgeon: Erline Levine, MD;  Location: [place] NEURO ORS;  Service: Neurosurgery;  Laterality: N/A;  CRANIOTOMY TUMOR EXCISION   EYE SURGERY     FOOT SURGERY  08/2010   JOINT REPLACEMENT Left    partial knee   KNEE ARTHROSCOPY  12/2008   LYMPH NODE BIOPSY     PACEMAKER INSERTION     TONSILLECTOMY          Home Medications    Prior to Admission medications   Medication Sig Start Date End Date  Taking? Authorizing Provider  acetaminophen (TYLENOL) 500 MG tablet Take 500 mg by mouth See admin instructions. Take 1 tablet three times daily as needed for pain and 1 tablet every night at bedtime    [provider]  amLODipine (NORVASC) 5 MG tablet TAKE ONE TABLET BY MOUTH EVERY DAY Patient taking differently: Take 5 mg by mouth daily.  01/02/18   [name], MD  aspirin EC 81 MG tablet Take 81 mg by mouth daily.    [provider]  Cholecalciferol (VITAMIN D3) 1000 units CAPS Take 1 capsule (1,000 Units total) by mouth daily. 12/24/17   [name], MD  citalopram (CELEXA) 10 MG tablet Take 1 tablet (10 mg total) by mouth daily. 10/07/18   [name], MD  levothyroxine (SYNTHROID, LEVOTHROID) 137 MCG tablet TAKE 1 TABLET BY MOUTH ONCE DAILY BEFORE BREAKFAST Patient taking differently: Take  137 mcg by mouth daily before breakfast.  12/24/17   [name], MD  methylphenidate (RITALIN) 10 MG tablet Take 1 tablet (10 mg total) by mouth daily. Patient taking differently: Take 10 mg by mouth daily as needed (when needing to concentrate).  04/01/18   Ventura Sellers, MD  metoprolol succinate (TOPROL-XL) 50 MG 24 hr tablet TAKE 1 AND 1/2 TABLET (75 MG) BY MOUTH EVERY DAY WITH OR IMMEDIATELY FOLLOWING A MEAL *DO NOT CRUSH* 01/02/18   [name], MD  rosuvastatin (CRESTOR) 20 MG tablet TAKE ONE TABLET BY MOUTH AT BEDTIME Patient taking differently: Take 20 mg by mouth daily.  11/08/17   Minna Merritts, MD  traZODone (DESYREL) 50 MG tablet Take 0.5-1 tablets (25-50 mg total) by mouth at bedtime. 10/14/18   [name], MD  Turmeric 500 MG CAPS Take 2 capsules by mouth daily.    [provider]  vitamin E (VITAMIN E) 400 UNIT capsule Take 1 capsule (400 Units total) by mouth 2 (two) times daily. 07/24/16   Hayden Pedro, PA-C    Family History Family History  Problem Relation Age of Onset   Cancer Mother 67       lung     Hypertension Mother    Hypertension Father    Heart attack Father    Heart disease Father    Rheum arthritis Sister    Cancer Maternal Grandmother     Social History Social History   Tobacco Use   Smoking status: Former Smoker    Packs/day: 1.00    Years: 3.00    Pack years: 3.00    Quit date: 04/10/1957    Years since quitting: 61.7   Smokeless tobacco: Never Used  Substance Use Topics   Alcohol use: Yes    Alcohol/week: 14.0 standard drinks    Types: 10 Glasses of wine, 4 Cans of beer per week   Drug use: No     Allergies   Ezetimibe and Simvastatin   Review of Systems Review of Systems  Neurological: Positive for weakness.  All other systems reviewed and are negative.    Physical Exam Updated Vital Signs BP 112/73 (BP Location: Right Arm)    Pulse 75    Temp 98.4 F (36.9 C) (Oral)    Resp (!) 21    Ht 5' 9"  (1.753 m)    Wt 90.7 kg    SpO2 94%    BMI 29.53 kg/m   Physical Exam Vitals signs and nursing note reviewed.  Constitutional:      General: He is not in acute distress.    Appearance: Normal appearance. He is well-developed and normal weight.  HENT:     Head: Normocephalic and atraumatic.     Mouth/Throat:     Mouth: Mucous membranes are dry.  Eyes:     Conjunctiva/sclera: Conjunctivae normal.     Pupils: Pupils are equal, round, and reactive to light.  Neck:     Musculoskeletal: Normal range of motion and neck supple.  Cardiovascular:     Rate and Rhythm: Normal rate and regular rhythm.     Heart sounds: No murmur.  Pulmonary:     Effort: Pulmonary effort is normal. Tachypnea present. No respiratory distress.     Breath sounds: Normal breath sounds. No wheezing or rales.  Abdominal:     General: There is no distension.     Palpations: Abdomen is soft.     Tenderness: There is no abdominal tenderness. There is no  guarding or rebound.  Musculoskeletal: Normal range of motion.        General: Swelling and tenderness present.        Feet:  Skin:    General: Skin is warm and dry.     Capillary Refill: Capillary refill takes less than 2 seconds.     Findings: No erythema or rash.  Neurological:     General: No focal deficit present.     Mental Status: He is alert and oriented to person, place, and time. Mental status is at baseline.  Psychiatric:        Mood and Affect: Mood normal.        Behavior: Behavior normal.        Thought Content: Thought content normal.      ED Treatments / Results  Labs (all labs ordered are listed, but only abnormal results are displayed) Labs Reviewed  CBC WITH DIFFERENTIAL/PLATELET - Abnormal; Notable for the following components:      Result Value   WBC 24.4 (*)    RBC 3.36 (*)    Hemoglobin 11.0 (*)    HCT 32.5 (*)    Platelets 97 (*)    nRBC 0.3 (*)    All other components within normal limits  COMPREHENSIVE METABOLIC PANEL - Abnormal; Notable for the following components:   CO2 18 (*)    Glucose, Bld 225 (*)    BUN 75 (*)    Creatinine, Ser 2.23 (*)    Calcium 8.7 (*)    Total Protein 5.6 (*)    Albumin 2.8 (*)    GFR calc non Af Amer 27 (*)    GFR calc Af Amer 31 (*)    All other components within normal limits  CK - Abnormal; Notable for the following components:   Total CK 19 (*)    All other components within normal limits  URINALYSIS, ROUTINE W REFLEX MICROSCOPIC - Abnormal; Notable for the following components:   APPearance HAZY (*)    Glucose, UA 150 (*)    Hgb urine dipstick LARGE (*)    Protein, ur 100 (*)    All other components within normal limits  HEMOGLOBIN A1C - Abnormal; Notable for the following components:   Hgb A1c MFr Bld 7.4 (*)    All other components within normal limits  POCT I-STAT EG7 - Abnormal; Notable for the following components:   pCO2, Ven 31.2 (*)    pO2, Ven 49.0 (*)    Bicarbonate 17.9 (*)    TCO2 19 (*)    Acid-base deficit 6.0 (*)    HCT 31.0 (*)    Hemoglobin 10.5 (*)    All other components within normal limits    URINE CULTURE    EKG None   ED ECG REPORT   Date: 12/16/2018  Rate: 75  Rhythm: ventricular paced rhythm  QRS Axis: indeterminate  Intervals: indeterminate  ST/T Wave abnormalities: indeterminate  Conduction Disutrbances:none  Narrative Interpretation:   Old EKG Reviewed: unchanged  I have personally reviewed the EKG tracing and agree with the computerized printout as noted.   Radiology Dg Chest Port 1 View  Result Date: 12/16/2018 CLINICAL DATA:  Brought in from home by EMS for weakness. Pt was discharged several days ago for sepsis d/t cellulitis in right foot. Daughter states he has gotten increasingly weak and yesterday started voiding large amounts. EXAM: PORTABLE CHEST 1 VIEW COMPARISON:  12/08/2018 FINDINGS: LEFT-sided transvenous pacemaker leads overlie the RIGHT atrium, coronary sinus, and RIGHT  ventricle. Median sternotomy and CABG. The heart is normal in size. There are no focal consolidations or pleural effusions. Subsegmental atelectasis identified at the LEFT lung base. There is no pulmonary edema. IMPRESSION: No active disease. Electronically Signed   By: Nolon Nations M.D.   On: 12/16/2018 15:16    Procedures Procedures (including critical care time)  Medications Ordered in ED Medications  sodium chloride 0.9 % bolus 1,000 mL (has no administration in time range)     Initial Impression / Assessment and Plan / ED Course  I have reviewed the triage vital signs and the nursing notes.  Pertinent labs & imaging results that were available during my care of the patient were reviewed by me and considered in my medical decision making (see chart for details).        Elderly male presenting today with generalized weakness now unable to get out of bed.  Patient was walking and having no difficulty prior to discharge on Thursday after being admitted for cellulitis.  He is also now developed polydipsia and polyuria.  Family checked his blood sugar today and it was  390 without prior history of diabetes.  Daughter who gives his medical history states that he did receive steroids while hospitalized due to the concern for a gout flare.  He also received a medication to boost his white blood cell count due to leukopenia.  Last white count was 13 prior to discharge.  Patient while hospitalized also developed AKI with a creatinine to 2.6 and was planning on following up as an outpatient with nephrology.  Patient has no diarrhea or abdominal pain.  He has no altered mental status but is sleepy on exam.  He has no chest pain but does complain of a mild cough.  It is unclear if this cough is new or changed.  Vital signs show mild tachypnea but no other acute findings.  Concern for new onset diabetes possible DKA versus rhabdomyolysis with diffuse body pain versus recurrent infection or electrolyte abnormality.  Labs pending.  Patient given 1 L of fluid.  No evidence of fluid overload at this time.  4:44 PM Patient's chest x-ray without acute findings, UA with 11-20 red cells, protein and sugar but no evidence of bacteria or infection, A1c is 7.4 today which is increased from his baseline of 6.  CK within normal limits, CMP with improving creatinine now of 2.23 from 2.6.  Patient's anion gap is 12 and blood sugar is 225.  CO2 is decreased and on VBG patient does had decreased bicarb of 18 but normal pH.  White count today is 24 which is most likely from the Neulasta shot.  Patient was getting prednisone while in the hospital with improvement of his pain but daughter does not think he has been taking it since being home.  Question whether steroid has worn off and he is just having repeated gout flare.  He is still complaining of significant pain and swelling in the left foot and still having generalized weakness.  Patient given IV fluid bolus.  Will restart steroid but given his elevated blood sugar, inability to walk and ongoing gout flare feel like he will need further steroids but  will also need blood sugar management.  Will readmit and patient will most likely require a higher level of care which daughter feels that he can most likely receive at the place where he is currently living.  No evidence of urinary retention at this time and no sign of UTI.  Final Clinical Impressions(s) / ED Diagnoses   Final diagnoses:  Generalized weakness  Acute gout of left foot, unspecified cause  Hyperglycemia    ED Discharge Orders    None       Blanchie Dessert, MD 12/16/18 1647    Blanchie Dessert, MD 12/16/18 1651

## 2018-12-16 NOTE — ED Triage Notes (Signed)
Brought in from home by EMS for weakness. Pt was discharged several days ago for sepsis d/t cellulitis in right foot. Daughter states he has gotten increasingly weak and yesterday started voiding large amounts. She checked his blood sugar and CBG was 411. No history of diabetes. Was unable to stand or ambulate today without assistance

## 2018-12-16 NOTE — Care Management (Signed)
ED CM consulted concerning assistance with transitional care planning. Patient was discharged from [facility] 9/3 back to [place]. Patient presented today with c/o of generalized weakness. CM met with patient's daughter Dr. [name] [phone] she is concerned that he may be needing a higher level of care at the facility. ED evaluation noted patient will be brought in for OBS.  ED CM will place info on Unit handoff.

## 2018-12-17 ENCOUNTER — Telehealth: Payer: Self-pay

## 2018-12-17 ENCOUNTER — Encounter (HOSPITAL_COMMUNITY): Payer: Self-pay

## 2018-12-17 DIAGNOSIS — T380X5A Adverse effect of glucocorticoids and synthetic analogues, initial encounter: Secondary | ICD-10-CM | POA: Diagnosis not present

## 2018-12-17 DIAGNOSIS — E875 Hyperkalemia: Secondary | ICD-10-CM | POA: Diagnosis present

## 2018-12-17 DIAGNOSIS — Z7984 Long term (current) use of oral hypoglycemic drugs: Secondary | ICD-10-CM | POA: Diagnosis not present

## 2018-12-17 DIAGNOSIS — Z79899 Other long term (current) drug therapy: Secondary | ICD-10-CM | POA: Diagnosis not present

## 2018-12-17 DIAGNOSIS — N400 Enlarged prostate without lower urinary tract symptoms: Secondary | ICD-10-CM | POA: Diagnosis present

## 2018-12-17 DIAGNOSIS — C7931 Secondary malignant neoplasm of brain: Secondary | ICD-10-CM | POA: Diagnosis not present

## 2018-12-17 DIAGNOSIS — E119 Type 2 diabetes mellitus without complications: Secondary | ICD-10-CM

## 2018-12-17 DIAGNOSIS — Z95810 Presence of automatic (implantable) cardiac defibrillator: Secondary | ICD-10-CM | POA: Diagnosis not present

## 2018-12-17 DIAGNOSIS — I69151 Hemiplegia and hemiparesis following nontraumatic intracerebral hemorrhage affecting right dominant side: Secondary | ICD-10-CM | POA: Diagnosis not present

## 2018-12-17 DIAGNOSIS — E1165 Type 2 diabetes mellitus with hyperglycemia: Secondary | ICD-10-CM | POA: Diagnosis not present

## 2018-12-17 DIAGNOSIS — I1310 Hypertensive heart and chronic kidney disease without heart failure, with stage 1 through stage 4 chronic kidney disease, or unspecified chronic kidney disease: Secondary | ICD-10-CM | POA: Diagnosis present

## 2018-12-17 DIAGNOSIS — R531 Weakness: Secondary | ICD-10-CM | POA: Diagnosis not present

## 2018-12-17 DIAGNOSIS — Z85820 Personal history of malignant melanoma of skin: Secondary | ICD-10-CM | POA: Diagnosis not present

## 2018-12-17 DIAGNOSIS — I129 Hypertensive chronic kidney disease with stage 1 through stage 4 chronic kidney disease, or unspecified chronic kidney disease: Secondary | ICD-10-CM | POA: Diagnosis present

## 2018-12-17 DIAGNOSIS — D708 Other neutropenia: Secondary | ICD-10-CM | POA: Diagnosis not present

## 2018-12-17 DIAGNOSIS — M109 Gout, unspecified: Secondary | ICD-10-CM

## 2018-12-17 DIAGNOSIS — E785 Hyperlipidemia, unspecified: Secondary | ICD-10-CM | POA: Diagnosis present

## 2018-12-17 DIAGNOSIS — Z85841 Personal history of malignant neoplasm of brain: Secondary | ICD-10-CM | POA: Diagnosis not present

## 2018-12-17 DIAGNOSIS — Z809 Family history of malignant neoplasm, unspecified: Secondary | ICD-10-CM | POA: Diagnosis not present

## 2018-12-17 DIAGNOSIS — D696 Thrombocytopenia, unspecified: Secondary | ICD-10-CM | POA: Diagnosis present

## 2018-12-17 DIAGNOSIS — C61 Malignant neoplasm of prostate: Secondary | ICD-10-CM | POA: Diagnosis present

## 2018-12-17 DIAGNOSIS — E7800 Pure hypercholesterolemia, unspecified: Secondary | ICD-10-CM | POA: Diagnosis present

## 2018-12-17 DIAGNOSIS — Z20828 Contact with and (suspected) exposure to other viral communicable diseases: Secondary | ICD-10-CM | POA: Diagnosis present

## 2018-12-17 DIAGNOSIS — Z23 Encounter for immunization: Secondary | ICD-10-CM | POA: Diagnosis present

## 2018-12-17 DIAGNOSIS — M1039 Gout due to renal impairment, multiple sites: Secondary | ICD-10-CM | POA: Diagnosis not present

## 2018-12-17 DIAGNOSIS — G3189 Other specified degenerative diseases of nervous system: Secondary | ICD-10-CM | POA: Diagnosis not present

## 2018-12-17 DIAGNOSIS — R5381 Other malaise: Secondary | ICD-10-CM | POA: Diagnosis not present

## 2018-12-17 DIAGNOSIS — E1122 Type 2 diabetes mellitus with diabetic chronic kidney disease: Secondary | ICD-10-CM | POA: Diagnosis present

## 2018-12-17 DIAGNOSIS — E039 Hypothyroidism, unspecified: Secondary | ICD-10-CM | POA: Diagnosis present

## 2018-12-17 DIAGNOSIS — I2510 Atherosclerotic heart disease of native coronary artery without angina pectoris: Secondary | ICD-10-CM | POA: Diagnosis present

## 2018-12-17 DIAGNOSIS — Z85118 Personal history of other malignant neoplasm of bronchus and lung: Secondary | ICD-10-CM | POA: Diagnosis not present

## 2018-12-17 DIAGNOSIS — N179 Acute kidney failure, unspecified: Secondary | ICD-10-CM | POA: Diagnosis present

## 2018-12-17 DIAGNOSIS — I69120 Aphasia following nontraumatic intracerebral hemorrhage: Secondary | ICD-10-CM | POA: Diagnosis not present

## 2018-12-17 DIAGNOSIS — R7309 Other abnormal glucose: Secondary | ICD-10-CM | POA: Diagnosis not present

## 2018-12-17 DIAGNOSIS — I252 Old myocardial infarction: Secondary | ICD-10-CM | POA: Diagnosis not present

## 2018-12-17 DIAGNOSIS — G4733 Obstructive sleep apnea (adult) (pediatric): Secondary | ICD-10-CM | POA: Diagnosis present

## 2018-12-17 DIAGNOSIS — G8191 Hemiplegia, unspecified affecting right dominant side: Secondary | ICD-10-CM | POA: Diagnosis not present

## 2018-12-17 DIAGNOSIS — S069X0S Unspecified intracranial injury without loss of consciousness, sequela: Secondary | ICD-10-CM | POA: Diagnosis not present

## 2018-12-17 DIAGNOSIS — Z951 Presence of aortocoronary bypass graft: Secondary | ICD-10-CM | POA: Diagnosis not present

## 2018-12-17 DIAGNOSIS — N183 Chronic kidney disease, stage 3 (moderate): Secondary | ICD-10-CM | POA: Diagnosis not present

## 2018-12-17 DIAGNOSIS — Z7982 Long term (current) use of aspirin: Secondary | ICD-10-CM | POA: Diagnosis not present

## 2018-12-17 DIAGNOSIS — Z87891 Personal history of nicotine dependence: Secondary | ICD-10-CM | POA: Diagnosis not present

## 2018-12-17 DIAGNOSIS — I10 Essential (primary) hypertension: Secondary | ICD-10-CM | POA: Diagnosis not present

## 2018-12-17 DIAGNOSIS — F09 Unspecified mental disorder due to known physiological condition: Secondary | ICD-10-CM | POA: Diagnosis not present

## 2018-12-17 DIAGNOSIS — E860 Dehydration: Secondary | ICD-10-CM | POA: Diagnosis present

## 2018-12-17 DIAGNOSIS — L0390 Cellulitis, unspecified: Secondary | ICD-10-CM | POA: Diagnosis not present

## 2018-12-17 DIAGNOSIS — R739 Hyperglycemia, unspecified: Secondary | ICD-10-CM | POA: Diagnosis not present

## 2018-12-17 DIAGNOSIS — C7800 Secondary malignant neoplasm of unspecified lung: Secondary | ICD-10-CM | POA: Diagnosis not present

## 2018-12-17 DIAGNOSIS — K219 Gastro-esophageal reflux disease without esophagitis: Secondary | ICD-10-CM | POA: Diagnosis present

## 2018-12-17 LAB — COMPREHENSIVE METABOLIC PANEL
ALT: 39 U/L (ref 0–44)
AST: 22 U/L (ref 15–41)
Albumin: 2.7 g/dL — ABNORMAL LOW (ref 3.5–5.0)
Alkaline Phosphatase: 46 U/L (ref 38–126)
Anion gap: 10 (ref 5–15)
BUN: 67 mg/dL — ABNORMAL HIGH (ref 8–23)
CO2: 16 mmol/L — ABNORMAL LOW (ref 22–32)
Calcium: 8.6 mg/dL — ABNORMAL LOW (ref 8.9–10.3)
Chloride: 112 mmol/L — ABNORMAL HIGH (ref 98–111)
Creatinine, Ser: 2.13 mg/dL — ABNORMAL HIGH (ref 0.61–1.24)
GFR calc Af Amer: 33 mL/min — ABNORMAL LOW (ref >60)
GFR calc non Af Amer: 29 mL/min — ABNORMAL LOW (ref >60)
Glucose, Bld: 234 mg/dL — ABNORMAL HIGH (ref 70–99)
Potassium: 5.5 mmol/L — ABNORMAL HIGH (ref 3.5–5.1)
Sodium: 138 mmol/L (ref 135–145)
Total Bilirubin: 0.6 mg/dL (ref 0.3–1.2)
Total Protein: 5.5 g/dL — ABNORMAL LOW (ref 6.5–8.1)

## 2018-12-17 LAB — CBC
HCT: 32.4 % — ABNORMAL LOW (ref 39.0–52.0)
Hemoglobin: 10.8 g/dL — ABNORMAL LOW (ref 13.0–17.0)
MCH: 32 pg (ref 26.0–34.0)
MCHC: 33.3 g/dL (ref 30.0–36.0)
MCV: 96.1 fL (ref 80.0–100.0)
Platelets: 103 10*3/uL — ABNORMAL LOW (ref 150–400)
RBC: 3.37 MIL/uL — ABNORMAL LOW (ref 4.22–5.81)
RDW: 15.5 % (ref 11.5–15.5)
WBC: 20 10*3/uL — ABNORMAL HIGH (ref 4.0–10.5)
nRBC: 0.1 % (ref 0.0–0.2)

## 2018-12-17 LAB — BASIC METABOLIC PANEL
Anion gap: 11 (ref 5–15)
BUN: 70 mg/dL — ABNORMAL HIGH (ref 8–23)
CO2: 17 mmol/L — ABNORMAL LOW (ref 22–32)
Calcium: 8 mg/dL — ABNORMAL LOW (ref 8.9–10.3)
Chloride: 107 mmol/L (ref 98–111)
Creatinine, Ser: 1.99 mg/dL — ABNORMAL HIGH (ref 0.61–1.24)
GFR calc Af Amer: 36 mL/min — ABNORMAL LOW (ref >60)
GFR calc non Af Amer: 31 mL/min — ABNORMAL LOW (ref >60)
Glucose, Bld: 287 mg/dL — ABNORMAL HIGH (ref 70–99)
Potassium: 5.1 mmol/L (ref 3.5–5.1)
Sodium: 135 mmol/L (ref 135–145)

## 2018-12-17 LAB — URINE CULTURE: Culture: NO GROWTH

## 2018-12-17 LAB — SARS CORONAVIRUS 2 (TAT 6-24 HRS): SARS Coronavirus 2: NEGATIVE

## 2018-12-17 LAB — GLUCOSE, CAPILLARY
Glucose-Capillary: 179 mg/dL — ABNORMAL HIGH (ref 70–99)
Glucose-Capillary: 197 mg/dL — ABNORMAL HIGH (ref 70–99)
Glucose-Capillary: 284 mg/dL — ABNORMAL HIGH (ref 70–99)
Glucose-Capillary: 367 mg/dL — ABNORMAL HIGH (ref 70–99)

## 2018-12-17 MED ORDER — ENOXAPARIN SODIUM 40 MG/0.4ML ~~LOC~~ SOLN
40.0000 mg | SUBCUTANEOUS | Status: DC
  Administered 2018-12-17: 40 mg via SUBCUTANEOUS
  Filled 2018-12-17: qty 0.4

## 2018-12-17 MED ORDER — POLYETHYLENE GLYCOL 3350 17 G PO PACK
17.0000 g | PACK | Freq: Two times a day (BID) | ORAL | Status: DC
  Administered 2018-12-17 – 2018-12-18 (×3): 17 g via ORAL
  Filled 2018-12-17 (×3): qty 1

## 2018-12-17 MED ORDER — BISACODYL 10 MG RE SUPP: 10.0000 mg | Freq: Every day | RECTAL | Status: DC | PRN

## 2018-12-17 MED ORDER — SENNA 8.6 MG PO TABS
1.0000 | ORAL_TABLET | Freq: Every day | ORAL | Status: DC
  Administered 2018-12-17: 8.6 mg via ORAL
  Filled 2018-12-17: qty 1

## 2018-12-17 MED ORDER — GLIPIZIDE 5 MG PO TABS
2.5000 mg | ORAL_TABLET | Freq: Every day | ORAL | Status: DC
  Administered 2018-12-18: 2.5 mg via ORAL
  Filled 2018-12-17: qty 1

## 2018-12-17 NOTE — Telephone Encounter (Signed)
Patient Name: David [name] Gender: Male DOB: 1940/07/14 Age: 78 Y 16 M 18 D Return Phone Number: [phone] (Primary), [phone] (Secondary) [place] [address] Client [facility] Night - Client Client Site [place] Physician [name] - MD Contact Type Call Who Is Calling Patient / Member / Family / Caregiver Call Type Triage / Clinical Caller Name Silas [place] Relationship To Patient Daughter Return Phone Number [phone] (Primary) Chief Complaint Blood Pressure Low Reason for Call Symptomatic / Request for David [name] states her father had sepsis on Friday. Can they do an outpatient lab on him? They want a CBC and CNP done on him.e Additional Comment He lives in [place]. His vitals are stable. Translation No Nurse Assessment Nurse: Alveta Heimlich, RN, Santiago Glad Date/Time Eilene Ghazi Time): 12/16/2018 10:01:16 AM Confirm and document reason for call. If symptomatic, describe symptoms. ---Caller states her father had sepsis on Friday. Can they do an outpatient lab on him? They want a CBC and CNP done on him bp 130/80 02 sat 92 percent. Caller states has some weakness no fever. Has the patient had close contact with a person known or suspected to have the novel coronavirus illness OR traveled / lives in area with major community spread (including international travel) in the last 14 days from the onset of symptoms? * If Asymptomatic, screen for exposure and travel within the last 14 days. ---No Does the patient have any new or worsening symptoms? ---Yes Will a triage be completed? ---Yes Related visit to physician within the last 2 weeks? ---No Does the PT have any chronic conditions? (i.e. diabetes, asthma, this includes High risk factors for pregnancy, etc.) ---Yes List chronic conditions. ---hypertension pacemaker Is this a behavioral health or substance  abuse call? ---No Guidelines Guideline Title Affirmed Question Affirmed Notes Nurse Date/Time (Eastern Time) Weakness (Generalized) and Fatigue [1] SEVERE weakness (i.e., unable to walk or barely able to walk, Alveta Heimlich, RN, Santiago Glad 12/16/2018 10:05:04 AM PLEASE NOTE: All timestamps contained within this report are represented as [place] Standard Time. CONFIDENTIALTY NOTICE: This fax transmission is intended only for the addressee. It contains information that is legally privileged, confidential or otherwise protected from use or disclosure. If you are not the intended recipient, you are strictly prohibited from reviewing, disclosing, copying using or disseminating any of this information or taking any action in reliance on or regarding this information. If you have received this fax in error, please notify us immediately by telephone so that we can arrange for its return to [place]. Phone: [phone], Toll-Free: [phone], Fax: [phone] Page: 2 of 2 Call Id: [id number] Guidelines Guideline Title Affirmed Question Affirmed Notes Nurse Date/Time Eilene Ghazi Time) requires support) AND [2] new onset or worsening Disp. Time Eilene Ghazi Time) Disposition Final User 12/16/2018 10:07:55 AM 911 Outcome Documentation Alveta Heimlich, RN, Santiago Glad Reason: Daughter will be taking to [facility] by private vehicle 12/16/2018 10:07:13 AM Call EMS 911 Now Yes Alveta Heimlich, RN, [place] Pellant Disagree/Comply Comply Caller Understands Yes PreDisposition Go to ED Care Advice Given Per Guideline CALL EMS 911 NOW: * Immediate medical attention is needed. You need to hang up and call 911 (or an ambulance). * Triager Discretion: I'll call you back in a few minutes to be sure you were able to reach them. BRING MEDICINES: * Please bring a list of your current medicines when you go to the Emergency Department (ER). * It is also a good idea to bring the pill bottles too.  This will help the doctor to make certain you are taking the right  medicines and the right dose. CARE ADVICE given per Weakness and Fatigue (Adult) guideline. Referrals [facility] - ED

## 2018-12-17 NOTE — Telephone Encounter (Addendum)
Noted. Pt hospitalized.  I spoke with hospitalist.

## 2018-12-17 NOTE — Telephone Encounter (Signed)
Noted. Spoke with hospitalist.

## 2018-12-17 NOTE — Social Work (Signed)
CSW is aware of pt daughter, Dr. [name], interest in her father receiving a higher level of care than ALF at discharge. Pt currently under observation, have reached out to [employer] at [facility] to better understand pt options.   Westley Hummer, MSW, [place] Work [phone]

## 2018-12-17 NOTE — Telephone Encounter (Signed)
Per chart review tab pt was seen at [facility] ED for admission on 12/16/18.

## 2018-12-17 NOTE — Telephone Encounter (Signed)
Hi,  My dad was admitted last week with cellulitis and mild sepsis. His creatinine went up. He needs a BMP early next week (Tuesday ish). Can you help him schedule?   Thanks,   KHL    I think your patient's daughter was trying to contact you regarding this. Hope you can help.

## 2018-12-17 NOTE — Evaluation (Signed)
Physical Therapy Evaluation Patient Details Name: David [name]. MRN: [id number] DOB: October 14, 1940 Today's Date: 12/17/2018   History of Present Illness  78 y.o males presents from ILF  with generalized weakness and polyuria/polydipsia. Significant PMH includes recently diagnosed prostate cancer, melanoma with brain mets complicated by leukopenia, thrombocytopenia and ICH, hypothyroidism, hypertension, hyperlipidemia, coronary artery disease status post CABG. Pt was recently hospitalized on 12/08/2018 for R foot cellulitis, L wrist gout and pancytopenia.  Clinical Impression  Pt presents with an overall decrease in functional mobility secondary to above. PTA, pt was independent for mobility without AD, lives in a ILF with his wife who has demential. Educ on precautions, positioning, therex, and importance of mobility. Today, pt able to perform bed mobility with mod assist, and tolerate gait training up to 80 ft with RW and min assist, pt with increased work of breathing noted, SpO2 WFL. Mobility limited by generalized weakness, poor endurance and R LE pain. Pt would benefit from continued acute PT services to maximize functional mobility and independence prior to d/c to CIR in order to address balance and strength deficits.     Follow Up Recommendations CIR;Supervision for mobility/OOB    Equipment Recommendations  Rolling walker with 5" wheels    Recommendations for Other Services Rehab consult     Precautions / Restrictions Precautions Precautions: Fall Restrictions Weight Bearing Restrictions: No      Mobility  Bed Mobility Overal bed mobility: Needs Assistance Bed Mobility: Supine to Sit;Sit to Supine     Supine to sit: Mod assist Sit to supine: Mod assist   General bed mobility comments: supine>sit with mod assist for trunk elevation, heavy use of bedrails. Sit>supine with mod assist for LE management.  Transfers Overall transfer level: Needs assistance Equipment used:  Rolling walker (2 wheeled);None Transfers: Sit to/from Stand Sit to Stand: Min assist         General transfer comment: Pt unable to come up to standing without AD despite assist from therapist. With RW pt performed sit<>stand from EOB with min assist  Ambulation/Gait Ambulation/Gait assistance: Min assist Gait Distance (Feet): 80 Feet Assistive device: Rolling walker (2 wheeled) Gait Pattern/deviations: Trunk flexed;Decreased step length - right;Decreased stance time - right Gait velocity: Decreased Gait velocity interpretation: <1.31 ft/sec, indicative of household ambulator General Gait Details: Pt used RW for ambulation this session, decreased step length and foot clearance noted R LE, cues for increased step length and upright posture  Stairs            Wheelchair Mobility    Modified Rankin (Stroke Patients Only)       Balance Overall balance assessment: Needs assistance Sitting-balance support: No upper extremity supported;Feet supported Sitting balance-Leahy Scale: Fair Sitting balance - Comments: static sitting balance supervision, dynamic sitting balance min guard assist   Standing balance support: Bilateral upper extremity supported;During functional activity Standing balance-Leahy Scale: Poor Standing balance comment: Reliant on BUE support using RW, delayed balance reactions                             Pertinent Vitals/Pain Pain Assessment: 0-10 Pain Score: 8  Pain Location: pt denies pain at rest, reports pain 8/10 with weightbearing in R ankle/LE Pain Descriptors / Indicators: Aching;Discomfort Pain Intervention(s): Limited activity within patient's tolerance;Repositioned;Monitored during session    Home Living Family/patient expects to be discharged to:: Assisted living Living Arrangements: Spouse/significant other Available Help at Discharge: Family;Available PRN/intermittently Type of Home: [place]  Access: Stairs to  enter Entrance Stairs-Rails: Right Entrance Stairs-Number of Steps: 2 Home Layout: One level Home Equipment: None Additional Comments: Pt's wife unable to physically assist. Report they live at [facility]     Prior Function Level of Independence: Independent               Hand Dominance        Extremity/Trunk Assessment   Upper Extremity Assessment Upper Extremity Assessment: Generalized weakness    Lower Extremity Assessment Lower Extremity Assessment: Generalized weakness    Cervical / Trunk Assessment Cervical / Trunk Assessment: Kyphotic  Communication   Communication: No difficulties  Cognition Arousal/Alertness: Awake/alert Behavior During Therapy: WFL for tasks assessed/performed Overall Cognitive Status: Impaired/Different from baseline Area of Impairment: Memory;Safety/judgement;Awareness                     Memory: Decreased short-term memory   Safety/Judgement: Decreased awareness of safety Awareness: Emergent   General Comments: Pt alert and oriented X3. Pt demonstrated slowed processing throughout.      General Comments      Exercises     Assessment/Plan    PT Assessment Patient needs continued PT services  PT Problem List Decreased strength;Decreased balance;Decreased activity tolerance;Decreased mobility;Decreased knowledge of use of DME;Decreased knowledge of precautions       PT Treatment Interventions Gait training;DME instruction;Stair training;Functional mobility training;Therapeutic exercise;Therapeutic activities;Balance training;Patient/family education    PT Goals (Current goals can be found in the Care Plan section)  Acute Rehab PT Goals Patient Stated Goal: to feel better PT Goal Formulation: With patient Time For Goal Achievement: 12/31/18 Potential to Achieve Goals: Good    Frequency Min 3X/week   Barriers to discharge Decreased caregiver support      Co-evaluation               AM-PAC PT "6  Clicks" Mobility  Outcome Measure Help needed turning from your back to your side while in a flat bed without using bedrails?: A Little Help needed moving from lying on your back to sitting on the side of a flat bed without using bedrails?: A Lot Help needed moving to and from a bed to a chair (including a wheelchair)?: A Little Help needed standing up from a chair using your arms (e.g., wheelchair or bedside chair)?: A Little Help needed to walk in hospital room?: A Little Help needed climbing 3-5 steps with a railing? : A Lot 6 Click Score: 16    End of Session Equipment Utilized During Treatment: Gait belt Activity Tolerance: Patient limited by fatigue Patient left: in bed;with bed alarm set;with family/visitor present;with call bell/phone within reach Nurse Communication: Mobility status PT Visit Diagnosis: Unsteadiness on feet (R26.81);Muscle weakness (generalized) (M62.81);Pain Pain - Right/Left: Right Pain - part of body: Ankle and joints of foot    Time: 0630-1601 PT Time Calculation (min) (ACUTE ONLY): 25 min   Charges:   PT Evaluation $PT Eval Moderate Complexity: 1 Mod PT Treatments $Gait Training: 8-22 mins        Netta Corrigan, PT, DPT Acute Rehab Office [place] 12/17/2018, 12:46 PM

## 2018-12-17 NOTE — Progress Notes (Signed)
Rehab Admissions Coordinator Note:  Patient was screened by Cleatrice Burke for appropriateness for an Inpatient Acute Rehab Consult per PT recs. Noted pt lives ALF at [facility] and SW facilitating higher level of care at [facility].  Cleatrice Burke RN MSN 12/17/2018, 1:22 PM  I can be reached at [phone].

## 2018-12-17 NOTE — NC FL2 (Signed)
[place] LEVEL OF CARE SCREENING TOOL     IDENTIFICATION  Patient Name: David [name]. Birthdate: 04-Mar-1941 Sex: male Admission Date (Current Location): 12/16/2018  [facility] and [place] Number:  Engineering geologist and Address:  [facility] [place] [place] [address] [place], [place] [address]      Provider Number: [id number]  Attending Physician Name and Address:  Samuella Cota, MD  Relative Name and Phone Number:       Current Level of Care: Hospital Recommended Level of Care: [place] Prior Approval Number:    Date Approved/Denied:   PASRR Number: [id number]  Discharge Plan: SNF    Current Diagnoses: Patient Active Problem List   Diagnosis Date Noted  . Weakness generalized 12/16/2018  . Altered mental status 12/09/2018  . Sepsis ([place]) 12/09/2018  . Fever 12/09/2018  . Neutropenia with fever ([place]) 12/09/2018  . Thrombocytopenia ([place]) 12/09/2018  . Acute pain of left wrist 11/15/2018  . Insomnia 10/14/2018  . Heart block AV complete ([place]) 10/01/2018  . Presence of biventricular cardiac pacemaker 10/01/2018  . Hypogonadism in male 04/18/2018  . Dandruff in adult 03/27/2018  . Dyslipidemia 03/27/2018  . Acute pain of right shoulder 03/11/2018  . Elevated PSA 12/24/2017  . Prediabetes 12/24/2017  . Hx of CABG 07/17/2017  . Alcohol use 04/19/2017  . Cognitive impairment 02/25/2017  . History of implantation of joint prosthesis of elbow 02/12/2017  . Osteoarthritis of knee 02/12/2017  . Hypercalcemia 11/01/2016  . Nontraumatic cortical hemorrhage of left cerebral hemisphere ([place]) 06/27/2016  . Hematochezia 05/06/2016  . Advanced care planning/counseling discussion 05/03/2016  . Atherosclerosis of native coronary artery of native heart with stable angina pectoris ([place]) 04/24/2016  . Ischemic cardiomyopathy 04/24/2016  . Acquired hypothyroidism 11/24/2015  . Metastatic melanoma to lung ([place])   . MDD  (major depressive disorder), recurrent episode, moderate ([place])   . Benign prostatic hyperplasia   . Metastasis to brain ([place]) 10/15/2015  . Malignant melanoma ([place]) 09/22/2015  . Lung mass   . Chronic kidney disease, stage III (moderate) (HCC)   . Overweight with body mass index (BMI) 25.0-29.9 08/26/2015  . Incontinence 08/26/2015  . Hemorrhagic stroke ([place]) 08/26/2015  . Hemiplegia and hemiparesis following nontraumatic intracerebral hemorrhage affecting right dominant side ([place]) 08/26/2015  . Aphasia following nontraumatic intracerebral hemorrhage 08/26/2015  . Gait disturbance, post-stroke 08/26/2015  . Benign essential HTN   . OSA on CPAP   . Biventricular ICD (implantable cardioverter-defibrillator) in place   . ICH (intracerebral hemorrhage) (HCC) - L frontal due to HTN vs CAA 08/22/2015    Orientation RESPIRATION BLADDER Height & Weight     Self, Time, Situation, Place  Normal Continent, External catheter Weight: 204 lb 9.4 oz (92.8 kg) Height:  5\' 9"  (175.3 cm)  BEHAVIORAL SYMPTOMS/MOOD NEUROLOGICAL BOWEL NUTRITION STATUS      Continent Diet(see discharge summary)  AMBULATORY STATUS COMMUNICATION OF NEEDS Skin   Extensive Assist Verbally Other (Comment)(cellulitis on right foot)                       Personal Care Assistance Level of Assistance  Bathing, Dressing, Feeding Bathing Assistance: Limited assistance Feeding assistance: Independent Dressing Assistance: Limited assistance     Functional Limitations Info  Hearing, Sight, Speech Sight Info: Adequate Hearing Info: Adequate Speech Info: Adequate    SPECIAL CARE FACTORS FREQUENCY  OT (By licensed OT), PT (By licensed PT)     PT Frequency:  5x week OT Frequency: 5x week            Contractures Contractures Info: Not present    Additional Factors Info  Code Status, Allergies, Psychotropic, Insulin Sliding Scale Code Status Info: Full Code Allergies Info: Ezetimibe; Simvastatin   Insulin  Sliding Scale Info: insulin aspart (novoLOG) injection 0-15 Units 3x daily with meals; insulin aspart (novoLOG) injection 0-5 Units daily at bedtime       Current Medications (12/17/2018):  This is the current hospital active medication list Current Facility-Administered Medications  Medication Dose Route Frequency Provider Last Rate Last Dose  . 0.9 %  sodium chloride infusion   Intravenous Continuous Pahwani, Rinka R, MD 125 mL/hr at 12/17/18 1245    . acetaminophen (TYLENOL) tablet 650 mg  650 mg Oral Q6H PRN Pahwani, Rinka R, MD   650 mg at 12/16/18 2241   Or  . acetaminophen (TYLENOL) suppository 650 mg  650 mg Rectal Q6H PRN Pahwani, Rinka R, MD      . amLODipine (NORVASC) tablet 5 mg  5 mg Oral Daily Pahwani, Rinka R, MD   5 mg at 12/17/18 0954  . aspirin EC tablet 81 mg  81 mg Oral QPM Pahwani, Rinka R, MD   81 mg at 12/16/18 2241  . bisacodyl (DULCOLAX) suppository 10 mg  10 mg Rectal Daily PRN Samuella Cota, MD      . citalopram (CELEXA) tablet 10 mg  10 mg Oral Daily Pahwani, Rinka R, MD   10 mg at 12/17/18 0954  . enoxaparin (LOVENOX) injection 30 mg  30 mg Subcutaneous Q24H Pahwani, Rinka R, MD   30 mg at 12/16/18 2243  . insulin aspart (novoLOG) injection 0-15 Units  0-15 Units Subcutaneous TID WC Pahwani, Rinka R, MD   3 Units at 12/17/18 1248  . insulin aspart (novoLOG) injection 0-5 Units  0-5 Units Subcutaneous QHS Pahwani, Rinka R, MD      . levothyroxine (SYNTHROID) tablet 137 mcg  137 mcg Oral Q0600 Pahwani, Rinka R, MD   137 mcg at 12/17/18 0536  . metoprolol succinate (TOPROL-XL) 24 hr tablet 75 mg  75 mg Oral BID Pahwani, Rinka R, MD   75 mg at 12/17/18 0952  . morphine 4 MG/ML injection 4 mg  4 mg Intravenous Q6H PRN Pahwani, Rinka R, MD   4 mg at 12/17/18 0351  . ondansetron (ZOFRAN) tablet 4 mg  4 mg Oral Q6H PRN Pahwani, Rinka R, MD       Or  . ondansetron (ZOFRAN) injection 4 mg  4 mg Intravenous Q6H PRN Pahwani, Rinka R, MD      . polyethylene glycol (MIRALAX  / GLYCOLAX) packet 17 g  17 g Oral BID Samuella Cota, MD   17 g at 12/17/18 1247  . predniSONE (DELTASONE) tablet 40 mg  40 mg Oral Q breakfast Pahwani, Rinka R, MD   40 mg at 12/17/18 0953  . rosuvastatin (CRESTOR) tablet 20 mg  20 mg Oral QHS Pahwani, Rinka R, MD   20 mg at 12/16/18 2241  . senna (SENOKOT) tablet 8.6 mg  1 tablet Oral QHS Samuella Cota, MD      . traZODone (DESYREL) tablet 25 mg  25 mg Oral QHS PRN Pahwani, Rinka R, MD   25 mg at 12/16/18 2241   Facility-Administered Medications Ordered in Other Encounters  Medication Dose Route Frequency Provider Last Rate Last Dose  . 0.9 %  sodium chloride infusion  1,000 mL Intravenous Once  [name], MD         Discharge Medications: Please see discharge summary for a list of discharge medications.  Relevant Imaging Results:  Relevant Lab Results:   Additional Information SS#:[id number] [place], [place]

## 2018-12-17 NOTE — Progress Notes (Addendum)
PROGRESS NOTE  David [name]. EPP:[id number] DOB: Aug 17, 1940 DOA: 12/16/2018 PCP: [name], MD  Brief History   78 year old male complicated past medical history detailed below, discharge 9/3 PRESENTED 9/7 with polyuria and polydipsia, generalized weakness.  A & P  New diagnosis diabetes mellitus type 2 --Hemoglobin A1c 7.4. --Minimal insulin requirement thus far with blood sugars fairly well controlled.  Reportedly had a blood sugar of 400 at home with significant polyuria at home. --Discussed in detail with PCP, suggest low-dose glipizide 2.5 mg daily and close outpatient follow-up.  Left foot pain and swelling, acute gout --Presumably gout.  No evidence of cellulitis or erythema.  Minimal pain on palpation today, seems to be responding to steroids.  Given resolving AKI, treatment will be steroids.  Generalized weakness.  PT is recommended CIR.  Leukocytosis likely secondary to Granix injection --Seems to be trending down.  No evidence of infection.  Stage IV malignant melanoma with metastatic disease to the brain, lung --Followed by Dr. Burr Medico and Dr. Mickeal Skinner  AKI superimposed on CKD stage III --Bicarb remains low consistent with previous values on hospitalization.  Fortunately renal function appears to be slowly improving. --Gentle IV fluids.  Repeat BMP in a.m.  Mild hyperkalemia. --Asymptomatic.  Continue fluids.  Repeat BMP in a.m.  Hypothyroidism --TSH significantly high at 10.  Continue Synthroid.  I discussed with PCP, TSH has been stable over the last year.  Probably sick euthyroid.  Recommends no changes at this time.  He will follow-up in the outpatient setting.  CAD, status post CABG, biventricular ICD in situ --Stable.  Benign essential hypertension --Stable.  PMH nontraumatic intracerebral hemorrhage with right hemiparesis, a aphasia; prostate cancer not receiving treatment  . Overall appears making some progress, blood sugars are fairly well  controlled, renal function appears to be slowly improving, expect hyperkalemia will spontaneously resolved.  Resolved Hospital Problem list    Right foot cellulitis   DVT prophylaxis: enoxaparin Code Status: Full Family Communication: daughter Dr. [name] at bedside Disposition Plan: pending    Murray Hodgkins, MD  [facility] Direct contact: see [email or web address] (further directions at bottom of note if needed) 7PM-7AM contact night coverage as at bottom of note 12/17/2018, 9:00 AM  LOS: 0 days   Significant Hospital Events   . 9/3 discharge after 4-day stay for sepsis secondary to right lower extremity cellulitis, complicated by neutropenic fever, pancytopenia in the setting of stage IV malignant melanoma with metastatic disease to the brain, lung, AKI superimposed on CKD stage III   Consults:  .    Procedures:  .   Significant Diagnostic Tests:  . Hemoglobin A1c 7.4 . 9/7 foot film.  No acute fracture or dislocation left foot.  Arthropathy first metatarsophalangeal and interphalangeal joints may represent inflammatory or osteoarthritis. . 9/7 chest x-ray no acute disease   Micro Data:  . 9/7 SARS-CoV-2 negative . Blood cultures pending 9/7 . 9/7 urine culture no growth   Antimicrobials:  .   Interval History/Subjective  Feels a lot better today.  Left foot pain seems to have resolved although he still has significant edema.  No nausea or vomiting today.  Tolerating diet. Daughter at bedside.  Objective   Vitals:  Vitals:   12/16/18 2236 12/17/18 0610  BP: (!) 156/72 128/78  Pulse: 86 75  Resp: (!) 22 18  Temp: 99.7 F (37.6 C) (!) 97.5 F (36.4 C)  SpO2: 95% 98%    Exam:  Constitutional:  . Appears calm and comfortable Eyes:  .  Appear grossly normal ENMT:  . grossly normal hearing  Respiratory:  . CTA bilaterally, no w/r/r.  . Respiratory effort normal. No retractions or accessory muscle use Cardiovascular:  . RRR, no m/r/g . No RLE extremity  edema   . There is 1+ nonpitting edema of the left foot.  No gross deformity is noted. Abdomen:  . Soft, nontender, nondistended Musculoskeletal:  . Left foot is mildly tender over the sole midfoot.  No other tenderness noted.  No gross deformity noted. Skin:  . There is no rash, induration, erythema or lesions of the right foot.  The left foot is notable for nonpitting edema but is otherwise unremarkable in appearance.  No lesions are seen. . palpation of skin: no induration or nodules Psychiatric:  . Mental status o Mood, affect appropriate o Short-term memory loss noted  I have personally reviewed the following:   Today's Data  . CBG stable 179-197 . Potassium 5.5, CO2 16, BUN 67, creatinine 2.13, anion gap 10 . Magnesium and phosphorus were normal on admission . LFTs unremarkable . High-sensitivity troponin elevated consistent with previous values.  No clinical significance. . WBC down to 20.0.  Hemoglobin stable 10.8.  Platelets stable at 103. Marland Kitchen TSH 10.506.    Scheduled Meds: . amLODipine  5 mg Oral Daily  . aspirin EC  81 mg Oral QPM  . citalopram  10 mg Oral Daily  . enoxaparin (LOVENOX) injection  30 mg Subcutaneous Q24H  . insulin aspart  0-15 Units Subcutaneous TID WC  . insulin aspart  0-5 Units Subcutaneous QHS  . levothyroxine  137 mcg Oral Q0600  . metoprolol succinate  75 mg Oral BID  . predniSONE  40 mg Oral Q breakfast  . rosuvastatin  20 mg Oral QHS   Continuous Infusions: . sodium chloride 125 mL/hr at 12/17/18 0411    Principal Problem:   Weakness generalized Active Problems:   Benign essential HTN   OSA on CPAP   Biventricular ICD (implantable cardioverter-defibrillator) in place   Hemorrhagic stroke ([place])   Malignant melanoma ([place])   Metastasis to brain ([place])   Thrombocytopenia ([place])   LOS: 0 days   How to contact the [facility] Attending or Consulting provider [place] or covering provider during after hours [place], for this patient?  1. Check the  care team in [facility] and look for a) attending/consulting TRH provider listed and b) the [facility] team listed 2. Log into [email or web address] and use [place]'s universal password to access. If you do not have the password, please contact the hospital operator. 3. Locate the [facility] provider you are looking for under [facility] and page to a number that you can be directly reached. 4. If you still have difficulty reaching the provider, please page the [facility] (Director on Call) for the Hospitalists listed on amion for assistance.   Time 35 minutes.  Time greater than 50% counseling coordination of care.

## 2018-12-18 ENCOUNTER — Inpatient Hospital Stay (HOSPITAL_COMMUNITY)
Admission: RE | Admit: 2018-12-18 | Discharge: 2018-12-28 | DRG: 945 | Disposition: A | Discharge status: Home Health | Payer: Medicare Other | Source: Intra-hospital | Attending: Physical Medicine and Rehabilitation | Admitting: Physical Medicine and Rehabilitation

## 2018-12-18 ENCOUNTER — Other Ambulatory Visit: Payer: Self-pay

## 2018-12-18 ENCOUNTER — Encounter (HOSPITAL_COMMUNITY): Payer: Self-pay | Admitting: *Deleted

## 2018-12-18 DIAGNOSIS — Z809 Family history of malignant neoplasm, unspecified: Secondary | ICD-10-CM

## 2018-12-18 DIAGNOSIS — G8191 Hemiplegia, unspecified affecting right dominant side: Secondary | ICD-10-CM | POA: Diagnosis present

## 2018-12-18 DIAGNOSIS — E1122 Type 2 diabetes mellitus with diabetic chronic kidney disease: Secondary | ICD-10-CM | POA: Diagnosis present

## 2018-12-18 DIAGNOSIS — Z87891 Personal history of nicotine dependence: Secondary | ICD-10-CM | POA: Diagnosis not present

## 2018-12-18 DIAGNOSIS — C7800 Secondary malignant neoplasm of unspecified lung: Secondary | ICD-10-CM | POA: Diagnosis present

## 2018-12-18 DIAGNOSIS — Z85820 Personal history of malignant melanoma of skin: Secondary | ICD-10-CM

## 2018-12-18 DIAGNOSIS — G3189 Other specified degenerative diseases of nervous system: Secondary | ICD-10-CM | POA: Diagnosis not present

## 2018-12-18 DIAGNOSIS — F09 Unspecified mental disorder due to known physiological condition: Secondary | ICD-10-CM

## 2018-12-18 DIAGNOSIS — R739 Hyperglycemia, unspecified: Secondary | ICD-10-CM

## 2018-12-18 DIAGNOSIS — R7309 Other abnormal glucose: Secondary | ICD-10-CM | POA: Diagnosis not present

## 2018-12-18 DIAGNOSIS — M109 Gout, unspecified: Secondary | ICD-10-CM | POA: Diagnosis present

## 2018-12-18 DIAGNOSIS — R5381 Other malaise: Secondary | ICD-10-CM | POA: Diagnosis present

## 2018-12-18 DIAGNOSIS — N183 Chronic kidney disease, stage 3 unspecified: Secondary | ICD-10-CM | POA: Diagnosis present

## 2018-12-18 DIAGNOSIS — Z85841 Personal history of malignant neoplasm of brain: Secondary | ICD-10-CM

## 2018-12-18 DIAGNOSIS — Z7982 Long term (current) use of aspirin: Secondary | ICD-10-CM

## 2018-12-18 DIAGNOSIS — N400 Enlarged prostate without lower urinary tract symptoms: Secondary | ICD-10-CM | POA: Diagnosis present

## 2018-12-18 DIAGNOSIS — S069X0S Unspecified intracranial injury without loss of consciousness, sequela: Secondary | ICD-10-CM | POA: Diagnosis not present

## 2018-12-18 DIAGNOSIS — C7931 Secondary malignant neoplasm of brain: Secondary | ICD-10-CM | POA: Diagnosis present

## 2018-12-18 DIAGNOSIS — I10 Essential (primary) hypertension: Secondary | ICD-10-CM

## 2018-12-18 DIAGNOSIS — N179 Acute kidney failure, unspecified: Secondary | ICD-10-CM | POA: Diagnosis present

## 2018-12-18 DIAGNOSIS — I252 Old myocardial infarction: Secondary | ICD-10-CM | POA: Diagnosis not present

## 2018-12-18 DIAGNOSIS — S069X9S Unspecified intracranial injury with loss of consciousness of unspecified duration, sequela: Secondary | ICD-10-CM

## 2018-12-18 DIAGNOSIS — Z23 Encounter for immunization: Secondary | ICD-10-CM

## 2018-12-18 DIAGNOSIS — I129 Hypertensive chronic kidney disease with stage 1 through stage 4 chronic kidney disease, or unspecified chronic kidney disease: Secondary | ICD-10-CM | POA: Diagnosis present

## 2018-12-18 DIAGNOSIS — E039 Hypothyroidism, unspecified: Secondary | ICD-10-CM | POA: Diagnosis present

## 2018-12-18 DIAGNOSIS — L0390 Cellulitis, unspecified: Secondary | ICD-10-CM | POA: Diagnosis not present

## 2018-12-18 DIAGNOSIS — D709 Neutropenia, unspecified: Secondary | ICD-10-CM | POA: Diagnosis not present

## 2018-12-18 DIAGNOSIS — T380X5A Adverse effect of glucocorticoids and synthetic analogues, initial encounter: Secondary | ICD-10-CM | POA: Diagnosis not present

## 2018-12-18 DIAGNOSIS — Z9221 Personal history of antineoplastic chemotherapy: Secondary | ICD-10-CM

## 2018-12-18 DIAGNOSIS — E1165 Type 2 diabetes mellitus with hyperglycemia: Secondary | ICD-10-CM | POA: Diagnosis present

## 2018-12-18 DIAGNOSIS — D708 Other neutropenia: Secondary | ICD-10-CM | POA: Diagnosis not present

## 2018-12-18 DIAGNOSIS — K219 Gastro-esophageal reflux disease without esophagitis: Secondary | ICD-10-CM | POA: Diagnosis present

## 2018-12-18 DIAGNOSIS — G4733 Obstructive sleep apnea (adult) (pediatric): Secondary | ICD-10-CM

## 2018-12-18 DIAGNOSIS — R531 Weakness: Secondary | ICD-10-CM | POA: Diagnosis present

## 2018-12-18 DIAGNOSIS — I2510 Atherosclerotic heart disease of native coronary artery without angina pectoris: Secondary | ICD-10-CM | POA: Diagnosis present

## 2018-12-18 DIAGNOSIS — Z20828 Contact with and (suspected) exposure to other viral communicable diseases: Secondary | ICD-10-CM | POA: Diagnosis present

## 2018-12-18 DIAGNOSIS — E7800 Pure hypercholesterolemia, unspecified: Secondary | ICD-10-CM | POA: Diagnosis present

## 2018-12-18 DIAGNOSIS — K0530 Chronic periodontitis, unspecified: Secondary | ICD-10-CM | POA: Diagnosis present

## 2018-12-18 DIAGNOSIS — Z79899 Other long term (current) drug therapy: Secondary | ICD-10-CM

## 2018-12-18 DIAGNOSIS — Z951 Presence of aortocoronary bypass graft: Secondary | ICD-10-CM | POA: Diagnosis not present

## 2018-12-18 DIAGNOSIS — M1039 Gout due to renal impairment, multiple sites: Secondary | ICD-10-CM | POA: Diagnosis present

## 2018-12-18 DIAGNOSIS — Z7984 Long term (current) use of oral hypoglycemic drugs: Secondary | ICD-10-CM | POA: Diagnosis not present

## 2018-12-18 DIAGNOSIS — K029 Dental caries, unspecified: Secondary | ICD-10-CM | POA: Diagnosis present

## 2018-12-18 DIAGNOSIS — R6884 Jaw pain: Secondary | ICD-10-CM | POA: Diagnosis not present

## 2018-12-18 DIAGNOSIS — K0401 Reversible pulpitis: Secondary | ICD-10-CM

## 2018-12-18 DIAGNOSIS — K0889 Other specified disorders of teeth and supporting structures: Secondary | ICD-10-CM | POA: Diagnosis present

## 2018-12-18 DIAGNOSIS — S069XAS Unspecified intracranial injury with loss of consciousness status unknown, sequela: Secondary | ICD-10-CM

## 2018-12-18 DIAGNOSIS — Z8546 Personal history of malignant neoplasm of prostate: Secondary | ICD-10-CM

## 2018-12-18 DIAGNOSIS — E785 Hyperlipidemia, unspecified: Secondary | ICD-10-CM | POA: Diagnosis present

## 2018-12-18 DIAGNOSIS — Z923 Personal history of irradiation: Secondary | ICD-10-CM

## 2018-12-18 LAB — GLUCOSE, CAPILLARY
Glucose-Capillary: 116 mg/dL — ABNORMAL HIGH (ref 70–99)
Glucose-Capillary: 133 mg/dL — ABNORMAL HIGH (ref 70–99)
Glucose-Capillary: 201 mg/dL — ABNORMAL HIGH (ref 70–99)
Glucose-Capillary: 272 mg/dL — ABNORMAL HIGH (ref 70–99)

## 2018-12-18 MED ORDER — GLIPIZIDE 2.5 MG HALF TABLET
2.5000 mg | ORAL_TABLET | Freq: Every day | ORAL | Status: DC
  Administered 2018-12-19 – 2018-12-22 (×4): 2.5 mg via ORAL
  Filled 2018-12-18 (×4): qty 1

## 2018-12-18 MED ORDER — INFLUENZA VAC A&B SA ADJ QUAD 0.5 ML IM PRSY
0.5000 mL | PREFILLED_SYRINGE | INTRAMUSCULAR | Status: AC
  Administered 2018-12-19: 13:00:00 0.5 mL via INTRAMUSCULAR
  Filled 2018-12-18: qty 0.5

## 2018-12-18 MED ORDER — AMLODIPINE BESYLATE 5 MG PO TABS
5.0000 mg | ORAL_TABLET | Freq: Every day | ORAL | Status: DC
  Administered 2018-12-19 – 2018-12-28 (×10): 5 mg via ORAL
  Filled 2018-12-18 (×10): qty 1

## 2018-12-18 MED ORDER — SORBITOL 70 % SOLN: 30.0000 mL | Freq: Every day | Status: DC | PRN

## 2018-12-18 MED ORDER — ONDANSETRON HCL 4 MG PO TABS: 4.0000 mg | ORAL_TABLET | Freq: Four times a day (QID) | ORAL | Status: DC | PRN

## 2018-12-18 MED ORDER — SENNA 8.6 MG PO TABS
1.0000 | ORAL_TABLET | Freq: Every day | ORAL | Status: DC
  Administered 2018-12-19 – 2018-12-27 (×9): 8.6 mg via ORAL
  Filled 2018-12-18 (×9): qty 1

## 2018-12-18 MED ORDER — ENOXAPARIN SODIUM 40 MG/0.4ML ~~LOC~~ SOLN: 40.0000 mg | SUBCUTANEOUS | Status: DC

## 2018-12-18 MED ORDER — METOPROLOL SUCCINATE ER 50 MG PO TB24
75.0000 mg | ORAL_TABLET | Freq: Two times a day (BID) | ORAL | Status: DC
  Administered 2018-12-18 – 2018-12-28 (×20): 75 mg via ORAL
  Filled 2018-12-18 (×20): qty 1

## 2018-12-18 MED ORDER — GLIPIZIDE 5 MG PO TABS: 2.5000 mg | ORAL_TABLET | Freq: Two times a day (BID) | ORAL | 0 refills | Status: DC

## 2018-12-18 MED ORDER — ONDANSETRON HCL 4 MG/2ML IJ SOLN: 4.0000 mg | Freq: Four times a day (QID) | INTRAMUSCULAR | Status: DC | PRN

## 2018-12-18 MED ORDER — BLOOD GLUCOSE MONITOR KIT: PACK | 0 refills | Status: DC

## 2018-12-18 MED ORDER — INSULIN ASPART 100 UNIT/ML ~~LOC~~ SOLN
0.0000 [IU] | Freq: Three times a day (TID) | SUBCUTANEOUS | Status: DC
  Administered 2018-12-18: 5 [IU] via SUBCUTANEOUS
  Administered 2018-12-19: 13:00:00 11 [IU] via SUBCUTANEOUS
  Administered 2018-12-19: 19:00:00 5 [IU] via SUBCUTANEOUS
  Administered 2018-12-20: 13:00:00 2 [IU] via SUBCUTANEOUS
  Administered 2018-12-20 – 2018-12-22 (×3): 5 [IU] via SUBCUTANEOUS
  Administered 2018-12-22: 08:00:00 2 [IU] via SUBCUTANEOUS
  Administered 2018-12-23: 3 [IU] via SUBCUTANEOUS
  Administered 2018-12-23: 8 [IU] via SUBCUTANEOUS
  Administered 2018-12-23: 3 [IU] via SUBCUTANEOUS
  Administered 2018-12-24: 8 [IU] via SUBCUTANEOUS
  Administered 2018-12-24: 2 [IU] via SUBCUTANEOUS
  Administered 2018-12-24: 13:00:00 3 [IU] via SUBCUTANEOUS
  Administered 2018-12-25: 2 [IU] via SUBCUTANEOUS

## 2018-12-18 MED ORDER — CITALOPRAM HYDROBROMIDE 10 MG PO TABS
10.0000 mg | ORAL_TABLET | Freq: Every day | ORAL | Status: DC
  Administered 2018-12-19 – 2018-12-28 (×10): 10 mg via ORAL
  Filled 2018-12-18 (×10): qty 1

## 2018-12-18 MED ORDER — POLYETHYLENE GLYCOL 3350 17 G PO PACK
17.0000 g | PACK | Freq: Two times a day (BID) | ORAL | Status: DC
  Administered 2018-12-19 – 2018-12-28 (×19): 17 g via ORAL
  Filled 2018-12-18 (×19): qty 1

## 2018-12-18 MED ORDER — ENOXAPARIN SODIUM 40 MG/0.4ML ~~LOC~~ SOLN
40.0000 mg | SUBCUTANEOUS | Status: DC
  Administered 2018-12-18 – 2018-12-27 (×10): 40 mg via SUBCUTANEOUS
  Filled 2018-12-18 (×10): qty 0.4

## 2018-12-18 MED ORDER — ACETAMINOPHEN 650 MG RE SUPP: 650.0000 mg | Freq: Four times a day (QID) | RECTAL | Status: DC | PRN

## 2018-12-18 MED ORDER — ACETAMINOPHEN 325 MG PO TABS
650.0000 mg | ORAL_TABLET | Freq: Four times a day (QID) | ORAL | Status: DC | PRN
  Administered 2018-12-19 – 2018-12-26 (×4): 650 mg via ORAL
  Filled 2018-12-18 (×4): qty 2

## 2018-12-18 MED ORDER — ROSUVASTATIN CALCIUM 20 MG PO TABS
20.0000 mg | ORAL_TABLET | Freq: Every day | ORAL | Status: DC
  Administered 2018-12-18 – 2018-12-27 (×10): 20 mg via ORAL
  Filled 2018-12-18 (×10): qty 1

## 2018-12-18 MED ORDER — PREDNISONE 20 MG PO TABS
40.0000 mg | ORAL_TABLET | Freq: Every day | ORAL | Status: AC
  Administered 2018-12-19 – 2018-12-20 (×2): 40 mg via ORAL
  Filled 2018-12-18 (×2): qty 2

## 2018-12-18 MED ORDER — ASPIRIN EC 81 MG PO TBEC
81.0000 mg | DELAYED_RELEASE_TABLET | Freq: Every evening | ORAL | Status: DC
  Administered 2018-12-18 – 2018-12-27 (×10): 81 mg via ORAL
  Filled 2018-12-18 (×10): qty 1

## 2018-12-18 MED ORDER — PREDNISONE 20 MG PO TABS: 40.0000 mg | ORAL_TABLET | Freq: Every day | ORAL | 0 refills | Status: DC

## 2018-12-18 MED ORDER — TRAZODONE HCL 50 MG PO TABS
25.0000 mg | ORAL_TABLET | Freq: Every evening | ORAL | Status: DC | PRN
  Administered 2018-12-19 – 2018-12-24 (×6): 25 mg via ORAL
  Filled 2018-12-18 (×7): qty 1

## 2018-12-18 MED ORDER — LEVOTHYROXINE SODIUM 137 MCG PO TABS
137.0000 ug | ORAL_TABLET | Freq: Every day | ORAL | Status: DC
  Administered 2018-12-19 – 2018-12-27 (×9): 137 ug via ORAL
  Filled 2018-12-18 (×10): qty 1

## 2018-12-18 MED ORDER — BISACODYL 10 MG RE SUPP: 10.0000 mg | Freq: Every day | RECTAL | Status: DC | PRN

## 2018-12-18 NOTE — Discharge Summary (Signed)
Physician Discharge Summary  David [name]. PPI:[id number] DOB: [birth year] DOA: 12/16/2018  PCP: David Bush, MD  Admit date: 12/16/2018 Discharge date: 12/18/2018  Admitted From: Assisted living Disposition: CIR  Recommendations for Outpatient Follow-up:  1. Follow up with PCP in 1-2 weeks 2. Please obtain BMP/CBC in one week  Discharge Condition: Stable CODE STATUS: Full Diet recommendation: Diabetic diet  Brief/Interim Summary: David [name]. is a 78 y.o. male with medical history significant of recently diagnosed prostate cancer, melanoma with brain mets complicated by leukopenia, thrombocytopenia and ICH, hypothyroidism, hypertension, hyperlipidemia, coronary artery disease status post CABG brought by his daughter to emergency department with above-mentioned complaints. Patient recently hospitalized on 12/08/2018 for  right foot cellulitis, pancytopenia (received Granix on 12/10/2018), left wrist gout. Pt received IV antibiotics and p.o. steroids and discharged home on p.o. doxycycline for 3 days. Daughter reports that patient was doing fine after the discharge from the hospital however since 3 days has been peeing a lot, with  progressively declining energy, lethargic, very weak and unable to sit on the chair, his BS this am was in 400s. Patient reports that his right foot pain and left wrist pain has resolved however since 3 days he has noticed left foot pain which is severe in intensity, swollen, denies association with redness, trauma, fever or chills. Patient denies recent exposure to COVID-19, headache, blurry vision, lightheadedness, dizziness, cough, congestion, nausea, vomiting, epigastric pain, hematemesis, black stool, urinary, bowel incontinence, leg swelling, chest pain, shortness of breath, palpitation, sleep or appetite changes. In ED patient appears lethargic, weak, complaining of generalized body ache and weakness.   Patient admitted as above with new onset  uncontrolled diabetes mellitus type 2, A1c 7.4.  Covered with insulin initially, now on glipizide 2.5 daily, likely will be able to control patient on p.o. medications and diet changes alone.  Nutrition/dietary has been consulted and is following along with patient to ensure patient understands new dietary restrictions and guidelines.  Otherwise continues to improve clinically, initial weakness and lethargy appears to be somewhat improving, continues to work with PT OT who currently recommending CIR.  Fortunately patient has been accepted to CIR and will transition there for further evaluation and treatment per their expertise.  Patient can continue to be followed by dietary while in-house to ensure safe transition home and to ensure adequate glucose control at home.  At this time patient can likely safely be transitioned off of sliding scale insulin to p.o. medications only in the interim.  Close follow-up with PCP in the next 1 to 2 weeks pending disposition would be warranted for further evaluation and treatment of glucose.  Patient will also require further diabetic education for point-of-care glucose checks as well as, glucometer education.  Patient otherwise stable and agreeable for discharge.   Discharge Diagnoses:  Principal Problem:   DM type 2 (diabetes mellitus, type 2) ([place]) Active Problems:   Benign essential HTN   OSA on CPAP   Biventricular ICD (implantable cardioverter-defibrillator) in place   Hemorrhagic stroke ([place])   Malignant melanoma ([place])   Metastasis to brain ([place])   Thrombocytopenia ([place])   Weakness generalized   AKI (acute kidney injury) ([place])    Discharge Instructions   Allergies as of 12/18/2018      Reactions   Ezetimibe Other (See Comments)   Unknown reaction   Simvastatin Other (See Comments)   Reaction:  Gave pt a fever  Fever - temp of 103.   In 2003  Medication List    STOP taking these medications   doxycycline 100 MG tablet Commonly known as:  VIBRA-TABS   methylphenidate 10 MG tablet Commonly known as: RITALIN   pentoxifylline 400 MG CR tablet Commonly known as: TRENTAL     TAKE these medications   acetaminophen 500 MG tablet Commonly known as: TYLENOL Take 500 mg by mouth every evening.   acetaminophen 650 MG CR tablet Commonly known as: TYLENOL Take 1,300 mg by mouth every 8 (eight) hours as needed for pain.   amLODipine 5 MG tablet Commonly known as: NORVASC TAKE ONE TABLET BY MOUTH EVERY DAY   aspirin EC 81 MG tablet Take 81 mg by mouth every evening.   blood glucose meter kit and supplies Kit Dispense based on patient and insurance preference. Use up to four times daily as directed. (FOR ICD-9 250.00, 250.01).   citalopram 10 MG tablet Commonly known as: CELEXA Take 1 tablet (10 mg total) by mouth daily.   glipiZIDE 5 MG tablet Commonly known as: GLUCOTROL Take 0.5 tablets (2.5 mg total) by mouth 2 (two) times daily before a meal.   levothyroxine 137 MCG tablet Commonly known as: SYNTHROID TAKE 1 TABLET BY MOUTH ONCE DAILY BEFORE BREAKFAST What changed:   how much to take  how to take this  when to take this  additional instructions   metoprolol succinate 50 MG 24 hr tablet Commonly known as: TOPROL-XL TAKE 1 AND 1/2 TABLET (75 MG) BY MOUTH EVERY DAY WITH OR IMMEDIATELY FOLLOWING A MEAL *DO NOT CRUSH* What changed:   how much to take  how to take this  when to take this  additional instructions   predniSONE 20 MG tablet Commonly known as: DELTASONE Take 2 tablets (40 mg total) by mouth daily with breakfast. Start taking on: December 19, 2018   rosuvastatin 20 MG tablet Commonly known as: CRESTOR TAKE ONE TABLET BY MOUTH AT BEDTIME What changed: when to take this   traZODone 50 MG tablet Commonly known as: DESYREL Take 0.5-1 tablets (25-50 mg total) by mouth at bedtime. What changed:   how much to take  when to take this  reasons to take this   Turmeric 500 MG  Caps Take 1,000 mg by mouth 2 (two) times daily.   Vitamin D3 25 MCG (1000 UT) Caps Take 1 capsule (1,000 Units total) by mouth daily.   vitamin E 400 UNIT capsule Commonly known as: vitamin E Take 1 capsule (400 Units total) by mouth 2 (two) times daily. What changed:   how much to take  when to take this       Allergies  Allergen Reactions  . Ezetimibe Other (See Comments)    Unknown reaction  . Simvastatin Other (See Comments)    Reaction:  Gave pt a fever  Fever - temp of 103.   In 2003    Procedures/Studies: Dg Wrist Complete Left  Result Date: 12/10/2018 CLINICAL DATA:  Chronic left wrist pain without known injury. EXAM: LEFT WRIST - COMPLETE 3+ VIEW COMPARISON:  None. FINDINGS: There is no evidence of fracture or dislocation. Severe degenerative changes seen involving the first carpometacarpal joint with osteophyte formation. Soft tissues are unremarkable. IMPRESSION: Severe osteoarthritis of the first carpometacarpal joint. No acute abnormality seen in the left wrist. Electronically Signed   By: Marijo Conception M.D.   On: 12/10/2018 14:46   Ct Head Wo Contrast  Result Date: 12/09/2018 CLINICAL DATA:  Altered level of consciousness, sepsis EXAM: CT HEAD  WITHOUT CONTRAST TECHNIQUE: Contiguous axial images were obtained from the base of the skull through the vertex without intravenous contrast. COMPARISON:  Most recent comparison CT September 18, 2018 FINDINGS: Brain: A previously described 4 mm enhancing lesion in the right cerebellum is not well seen in the absence of contrast on today's study. There is a redemonstrated focus of encephalomalacia in the anterior left frontal lobe with associated ex vacuo dilatation of the anterior horn of the left lateral ventricle. No new intracranial lesions are seen. No evidence of acute infarction, hemorrhage, hydrocephalus, extra-axial collection or mass lesion/mass effect. Symmetric prominence of the ventricles, cisterns and sulci compatible  with parenchymal volume loss. Patchy areas of white matter hypoattenuation are most compatible with chronic microvascular angiopathy. Vascular: Atherosclerotic calcification of the carotid siphons and intradural vertebral arteries. Skull: Remote left pterional craniotomy changes are again seen. Few prominent venous lakes are unchanged from prior. No calvarial fracture or suspicious osseous lesion. No scalp swelling or hematoma. Sinuses/Orbits: Minimal mural thickening in the left maxillary, sphenoid and ethmoid sinuses. Some pneumatized secretions are present within the ethmoids as well. Bilateral lens extractions are noted. Left scleral buckle is seen. Orbits are otherwise unremarkable. Other: None IMPRESSION: 1. No acute intracranial abnormality. 2. A previously described 4 mm enhancing lesion in the right cerebellum is not well seen in the absence of contrast on today's study. No new intracranial lesions are seen. Electronically Signed   By: Lovena Le M.D.   On: 12/09/2018 02:29   Nm Bone Scan Whole Body  Result Date: 12/11/2018 CLINICAL DATA:  History of prostate cancer. EXAM: NUCLEAR MEDICINE WHOLE BODY BONE SCAN TECHNIQUE: Whole body anterior and posterior images were obtained approximately 3 hours after intravenous injection of radiopharmaceutical. RADIOPHARMACEUTICALS:  21.7 mCi Technetium-28mMDP IV COMPARISON:  PET scan of September 18, 2018. Radiographs of November 26, 2017. FINDINGS: Minimal abnormal uptake is seen involving both feet consistent with degenerative change. Abnormal uptake is seen involving the right glenohumeral joint suggesting degenerative change. Degenerative changes seen involving the left knee. No other areas of abnormal uptake are noted. IMPRESSION: No definite scintigraphic evidence of osseous metastases. Electronically Signed   By: JMarijo ConceptionM.D.   On: 12/11/2018 15:19   UKoreaRenal  Result Date: 12/11/2018 CLINICAL DATA:  Acute renal insufficiency. EXAM: RENAL / URINARY TRACT  ULTRASOUND COMPLETE COMPARISON:  CT of the abdomen December 09, 2018 FINDINGS: Right Kidney: Renal measurements: 11.1 x 6.2 x 5.7 cm = volume: 205 mL. Increased cortical echogenicity and mild cortical thinning. Complex cystic structure in the midpole region of the right kidney measures 2.7 x 2 cm. Additional 1 cm benign-appearing cyst in the upper pole of the right kidney. Left Kidney: Renal measurements: 13 x 5.6 x 6.3 cm = volume: 240 mL. Increased cortical echogenicity and mild cortical thinning. Simple appearing cyst in the midpole region of the left kidney measures 1.9 cm. Additional complex cystic structure in the lower pole of the left kidney measures 1.3 cm Bladder: Appears normal for degree of bladder distention. IMPRESSION: Increased cortical echogenicity and mild cortical thinning bilaterally. Bilateral complex and simple appearing renal cysts. Indention on future follow-up. Normal appearance of the urinary bladder. Electronically Signed   By: DFidela SalisburyM.D.   On: 12/11/2018 20:29   Dg Chest Port 1 View  Result Date: 12/16/2018 CLINICAL DATA:  Brought in from home by EMS for weakness. Pt was discharged several days ago for sepsis d/t cellulitis in right foot. Daughter states he has gotten  increasingly weak and yesterday started voiding large amounts. EXAM: PORTABLE CHEST 1 VIEW COMPARISON:  12/08/2018 FINDINGS: LEFT-sided transvenous pacemaker leads overlie the RIGHT atrium, coronary sinus, and RIGHT ventricle. Median sternotomy and CABG. The heart is normal in size. There are no focal consolidations or pleural effusions. Subsegmental atelectasis identified at the LEFT lung base. There is no pulmonary edema. IMPRESSION: No active disease. Electronically Signed   By: Nolon Nations M.D.   On: 12/16/2018 15:16   Dg Chest Port 1 View  Result Date: 12/09/2018 CLINICAL DATA:  Sepsis EXAM: PORTABLE CHEST 1 VIEW COMPARISON:  Radiograph 05/24/2017, CT chest 09/01/2015 FINDINGS: Postsurgical  changes related to prior CABG including intact and aligned sternotomy wires and multiple surgical clips projecting over the mediastinum. Three lead pacer battery pack overlies the left chest wall with leads in the right atrium, cardiac apex and coronary sinus. Cardiac silhouette is enlarged accounting for the portable technique. The aorta is calcified. There is tortuosity of the right brachiocephalic vessels as well. Calcifications noted at the carotid bifurcations. Lung volumes are diminished. Streaky opacities are present in the lung bases with more focal right infrahilar opacity. Pulmonary vascularity is somewhat cephalized. IMPRESSION: Findings are compatible with CHF/volume overload with mild edema and cardiomegaly in this patient with history of CABG. Early infectious process could have a similar appearance, particularly in the lung bases, and should be excluded on a clinical basis. Electronically Signed   By: Lovena Le M.D.   On: 12/09/2018 00:16   Dg Foot 2 Views Left  Result Date: 12/16/2018 CLINICAL DATA:  Pain and swelling. EXAM: LEFT FOOT - 2 VIEW COMPARISON:  Left ankle radiograph Aug 27, 2015 FINDINGS: There is no evidence of fracture or dislocation. Arthritic changes at the first metatarsophalangeal joint with joint space narrowing, subchondral sclerosis, erosive changes and cyst formation. Milder arthritic changes with subchondral erosions of the first interphalangeal joint. Soft tissues are unremarkable. IMPRESSION: 1. No acute fracture or dislocation identified about the left foot. 2. Arthropathy at the first metatarsophalangeal and interphalangeal joints may represent inflammatory or osteoarthritis. Electronically Signed   By: Fidela Salisbury M.D.   On: 12/16/2018 18:29   Dg Foot Complete Right  Result Date: 12/09/2018 CLINICAL DATA:  Possible sepsis, redness on foot EXAM: RIGHT FOOT COMPLETE - 3+ VIEW COMPARISON:  None. FINDINGS: Postsurgical changes from prior first tarsometatarsal  arthrodesis secured by 2 fully threaded screws. An additional fully threaded screw at the anterior process of the calcaneus, likely related to prior ORIF, correlate with surgical history. Marked hallux valgus deformity across the first metatarsophalangeal joint with hypertrophic change. There is remote posttraumatic change of the second proximal middle phalanges with posttraumatic fusion. Additional fusion is seen across the fourth middle and distal phalanges. There is diffuse soft tissue swelling and vascular calcification. No convincing features of osteomyelitis such as cortical destruction, periostitis or erosive change though evaluation is limited by the background of extensive degenerative features throughout the foot. There is a small linear radiodensity in the soft tissues adjacent the base of the fifth proximal phalanx which could reflect a small foreign body. No subcutaneous gas. IMPRESSION: 1. No convincing features of osteomyelitis though evaluation is limited by the background of extensive degenerative changes and postsurgical features throughout the foot. 2. Small linear radiodensity in the soft tissues adjacent to the base of the fifth proximal phalanx which could reflect a small foreign body. Electronically Signed   By: Lovena Le M.D.   On: 12/09/2018 00:22   Ct Renal Stone  Study  Result Date: 12/09/2018 CLINICAL DATA:  Altered level of consciousness, sepsis EXAM: CT ABDOMEN AND PELVIS WITHOUT CONTRAST TECHNIQUE: Multidetector CT imaging of the abdomen and pelvis was performed following the standard protocol without IV contrast. COMPARISON:  Chest radiograph December 08, 2018, PET-CT September 18, 2018 FINDINGS: Lower chest: Minimal atelectatic changes in the lung bases. 3 lead pacer wires are better visualized on scout radiography. Normal heart size. No pericardial effusion. Hepatobiliary: No focal liver abnormality is seen. No gallstones, gallbladder wall thickening, or biliary dilatation. Pancreas:  Mild atrophy of the pancreas. No peripancreatic inflammation or ductal dilatation. Spleen: Normal in size without focal abnormality. Adrenals/Urinary Tract: Normal adrenal glands. Bilateral mild renal atrophy with cortical scarring in both kidneys. Mild bilateral nonspecific perinephric stranding, a nonspecific finding though may correlate with either age or decreased renal function. No visible urolithiasis. No hydronephrosis. No concerning renal masses. Urinary bladder is unremarkable. Stomach/Bowel: Distal esophagus, stomach and duodenal sweep are unremarkable. No bowel wall thickening or dilatation. No evidence of obstruction. Cecum is partially displaced into the right upper quadrant. Extensive pancolonic diverticulosis is present without focal pericolonic inflammatory features to suggest diverticulitis. Vascular/Lymphatic: Atherosclerotic plaque is present throughout the aorta. There is mild fusiform dilatation of the infrarenal abdominal aorta up to 3.1 cm in maximal diameter. Mild ectasia of the common iliacs is noted as well measuring up to 2.3 cm on the left and 2.1 cm on the right. No suspicious or enlarged lymph nodes in the included lymphatic chains. Reproductive: The prostate and seminal vesicles are unremarkable. Other: No abdominopelvic free fluid or free gas. No bowel containing hernias. Small fat containing inguinal hernias are noted. Small fat containing umbilical hernia is present. Musculoskeletal: Multilevel degenerative changes are present in the imaged portions of the spine. There is focal levocurvature at L1-2 with partial fusion across the L1-2 levels. Discogenic and facet degenerative changes result in at least mild canal stenosis at L2-3. Moderate to severe foraminal narrowing is seen bilaterally at L4-5. No acute osseous abnormality or suspicious osseous lesion. IMPRESSION: 1. No acute intra-abdominal process. Specifically no evidence of urolithiasis or hydronephrosis. 2. Mild bilateral  renal atrophy. 3. Extensive pancolonic diverticulosis without evidence of diverticulitis. 4. Mild fusiform dilatation of the infrarenal abdominal aorta up to 3.1 cm in maximal diameter. Mild ectasia of the common iliacs bilaterally. Recommend followup by ultrasound in 3 years. This recommendation follows ACR consensus guidelines: White Paper of the ACR Incidental Findings Committee II on Vascular Findings. J Am Coll Radiol 2013; 10:789-794. Aortic aneurysm NOS (ICD10-I71.9) 5. Aortic Atherosclerosis (ICD10-I70.0). Electronically Signed   By: Lovena Le M.D.   On: 12/09/2018 02:37    Subjective: No acute issues or events overnight, declines chest pain, shortness of breath, nausea, vomiting, diarrhea, constipation, headache, fever, chills  Discharge Exam: Vitals:   12/17/18 2059 12/18/18 0444  BP: 121/75 129/81  Pulse: 78 76  Resp: 16 18  Temp: 97.7 F (36.5 C) 97.6 F (36.4 C)  SpO2: 97% 97%   Vitals:   12/17/18 0915 12/17/18 1506 12/17/18 2059 12/18/18 0444  BP: 120/83 110/75 121/75 129/81  Pulse: 78 79 78 76  Resp: _0 Temp: 98.5 F (36.9 C) 98.4 F (36.9 C) 97.7 F (36.5 C) 97.6 F (36.4 C)  TempSrc: Oral Oral Oral Oral  SpO2: 97% 95% 97% 97%  Weight:      Height:        General:  Pleasantly resting in bed, No acute distress. HEENT:  Normocephalic atraumatic.  Sclerae nonicteric, noninjected.  Extraocular movements intact bilaterally. Neck:  Without mass or deformity.  Trachea is midline. Lungs:  Clear to auscultate bilaterally without rhonchi, wheeze, or rales. Heart:  Regular rate and rhythm.  Without murmurs, rubs, or gallops. Abdomen:  Soft, nontender, nondistended.  Without guarding or rebound. Extremities: Without cyanosis, clubbing, edema, or obvious deformity. Vascular:  Dorsalis pedis and posterior tibial pulses palpable bilaterally. Skin:  Warm and dry, no erythema, no ulcerations.   The results of significant diagnostics from this hospitalization  (including imaging, microbiology, ancillary and laboratory) are listed below for reference.     Microbiology: Recent Results (from the past 240 hour(s))  Blood Culture (routine x 2)     Status: None   Collection Time: 12/08/18 11:51 PM   Specimen: BLOOD  Result Value Ref Range Status   Specimen Description BLOOD LEFT ANTECUBITAL  Final   Special Requests   Final    BOTTLES DRAWN AEROBIC AND ANAEROBIC Blood Culture adequate volume   Culture   Final    NO GROWTH 5 DAYS Performed at [facility], [address] [place], [place] [address]    Report Status 12/14/2018 FINAL  Final  Blood Culture (routine x 2)     Status: None   Collection Time: 12/08/18 11:53 PM   Specimen: BLOOD  Result Value Ref Range Status   Specimen Description BLOOD RIGHT ANTECUBITAL  Final   Special Requests   Final    BOTTLES DRAWN AEROBIC AND ANAEROBIC Blood Culture results may not be optimal due to an excessive volume of blood received in culture bottles   Culture   Final    NO GROWTH 5 DAYS Performed at [facility], [place] [address], [place], [place] [address]    Report Status 12/14/2018 FINAL  Final  SARS Coronavirus 2 [facility] order, Performed in [facility] hospital lab) Nasopharyngeal Nasopharyngeal Swab     Status: None   Collection Time: 12/09/18 12:24 AM   Specimen: Nasopharyngeal Swab  Result Value Ref Range Status   SARS Coronavirus 2 NEGATIVE NEGATIVE Final    Comment: (NOTE) If result is NEGATIVE SARS-CoV-2 target nucleic acids are NOT DETECTED. The SARS-CoV-2 RNA is generally detectable in upper and lower  respiratory specimens during the acute phase of infection. The lowest  concentration of SARS-CoV-2 viral copies this assay can detect is 250  copies / mL. A negative result does not preclude SARS-CoV-2 infection  and should not be used as the sole basis for treatment or other  patient management decisions.  A negative result may occur with  improper specimen collection /  handling, submission of specimen other  than nasopharyngeal swab, presence of viral mutation(s) within the  areas targeted by this assay, and inadequate number of viral copies  (<250 copies / mL). A negative result must be combined with clinical  observations, patient history, and epidemiological information. If result is POSITIVE SARS-CoV-2 target nucleic acids are DETECTED. The SARS-CoV-2 RNA is generally detectable in upper and lower  respiratory specimens dur ing the acute phase of infection.  Positive  results are indicative of active infection with SARS-CoV-2.  Clinical  correlation with patient history and other diagnostic information is  necessary to determine patient infection status.  Positive results do  not rule out bacterial infection or co-infection with other viruses. If result is PRESUMPTIVE POSTIVE SARS-CoV-2 nucleic acids MAY BE PRESENT.   A presumptive positive result was obtained on the submitted specimen  and confirmed on repeat testing.  While 2019 novel coronavirus  (SARS-CoV-2) nucleic acids may be present in the submitted sample  additional confirmatory testing may be necessary for epidemiological  and / or clinical management purposes  to differentiate between  SARS-CoV-2 and other Sarbecovirus currently known to infect humans.  If clinically indicated additional testing with an alternate test  methodology [phone]) is advised. The SARS-CoV-2 RNA is generally  detectable in upper and lower respiratory sp ecimens during the acute  phase of infection. The expected result is Negative. Fact Sheet for Patients:  StrictlyIdeas.no Fact Sheet for Healthcare Providers: BankingDealers.co.za This test is not yet approved or cleared by the [place] FDA and has been authorized for detection and/or diagnosis of SARS-CoV-2 by FDA under an Emergency Use Authorization (EUA).  This EUA will remain in effect (meaning this  test can be used) for the duration of the COVID-19 declaration under Section 564(b)(1) of the Act, 21 U.S.C. section 360bbb-3(b)(1), unless the authorization is terminated or revoked sooner. Performed at [facility], [place] [address], [place], [place] [address]   Urine Culture     Status: None   Collection Time: 12/09/18  1:27 AM   Specimen: Urine, Clean Catch  Result Value Ref Range Status   Specimen Description URINE, CLEAN CATCH  Final   Special Requests NONE  Final   Culture   Final    NO GROWTH Performed at [facility], [place] [address], [place], [place] [address]    Report Status 12/10/2018 FINAL  Final  Urine Culture     Status: None   Collection Time: 12/16/18  3:51 PM   Specimen: Urine, Clean Catch  Result Value Ref Range Status   Specimen Description URINE, CLEAN CATCH  Final   Special Requests NONE  Final   Culture   Final    NO GROWTH Performed at [facility], [place] [address] [place], [place] [address]    Report Status 12/17/2018 FINAL  Final  SARS CORONAVIRUS 2 (TAT 6-24 HRS) Nasopharyngeal Nasopharyngeal Swab     Status: None   Collection Time: 12/16/18  4:45 PM   Specimen: Nasopharyngeal Swab  Result Value Ref Range Status   SARS Coronavirus 2 NEGATIVE NEGATIVE Final    Comment: (NOTE) SARS-CoV-2 target nucleic acids are NOT DETECTED. The SARS-CoV-2 RNA is generally detectable in upper and lower respiratory specimens during the acute phase of infection. Negative results do not preclude SARS-CoV-2 infection, do not rule out co-infections with other pathogens, and should not be used as the sole basis for treatment or other patient management decisions. Negative results must be combined with clinical observations, patient history, and epidemiological information. The expected result is Negative. Fact Sheet for Patients: SugarRoll.be Fact Sheet for Healthcare  Providers: https://www.woods-mathews.com/ This test is not yet approved or cleared by the [place] FDA and  has been authorized for detection and/or diagnosis of SARS-CoV-2 by FDA under an Emergency Use Authorization (EUA). This EUA will remain  in effect (meaning this test can be used) for the duration of the COVID-19 declaration under Section 56 4(b)(1) of the Act, 21 U.S.C. section 360bbb-3(b)(1), unless the authorization is terminated or revoked sooner. Performed at [facility], [place] [address] [place], [place] [address]   Culture, blood (routine x 2)     Status: None (Preliminary result)   Collection Time: 12/16/18  8:31 PM   Specimen: BLOOD  Result Value Ref Range Status   Specimen Description BLOOD LEFT ANTECUBITAL  Final  Special Requests   Final    BOTTLES DRAWN AEROBIC AND ANAEROBIC Blood Culture results may not be optimal due to an excessive volume of blood received in culture bottles   Culture   Final    NO GROWTH 2 DAYS Performed at [facility], [place] [address], [place], [place] [address]    Report Status PENDING  Incomplete  Culture, blood (routine x 2)     Status: None (Preliminary result)   Collection Time: 12/16/18  8:41 PM   Specimen: BLOOD LEFT HAND  Result Value Ref Range Status   Specimen Description BLOOD LEFT HAND  Final   Special Requests   Final    BOTTLES DRAWN AEROBIC AND ANAEROBIC Blood Culture adequate volume   Culture   Final    NO GROWTH 2 DAYS Performed at [facility], [place] [address], [place], [place] [address]    Report Status PENDING  Incomplete     Labs: BNP (last 3 results) No results for input(s): BNP in the last 8760 hours. Basic Metabolic Panel: Recent Labs  Lab 12/12/18 0309 12/16/18 1540 12/16/18 1559 12/16/18 2041 12/17/18 0223 12/17/18 1524  NA 130* 137 137  --  138 135  K 4.3 4.6 4.5  --  5.5* 5.1  CL 98 107  --   --  112* 107  CO2 19* 18*  --   --  16* 17*  GLUCOSE 256* 225*  --    --  234* 287*  BUN 61* 75*  --   --  67* 70*  CREATININE 2.60* 2.23*  --  2.02* 2.13* 1.99*  CALCIUM 8.0* 8.7*  --   --  8.6* 8.0*  MG  --   --   --  2.0  --   --   PHOS  --   --   --  4.0  --   --    Liver Function Tests: Recent Labs  Lab 12/16/18 1540 12/17/18 0223  AST 19 22  ALT 40 39  ALKPHOS 49 46  BILITOT 0.6 0.6  PROT 5.6* 5.5*  ALBUMIN 2.8* 2.7*   No results for input(s): LIPASE, AMYLASE in the last 168 hours. No results for input(s): AMMONIA in the last 168 hours. CBC: Recent Labs  Lab 12/12/18 0309 12/16/18 1540 12/16/18 1559 12/16/18 2041 12/17/18 0223  WBC 13.2* 24.4*  --  26.6* 20.0*  NEUTROABS 8.0* 11.7*  --   --   --   HGB 10.5* 11.0* 10.5* 10.9* 10.8*  HCT 30.2* 32.5* 31.0* 32.5* 32.4*  MCV 93.2 96.7  --  95.6 96.1  PLT 112* 97*  --  101* 103*   Cardiac Enzymes: Recent Labs  Lab 12/16/18 1540  CKTOTAL 19*   BNP: Invalid input(s): POCBNP CBG: Recent Labs  Lab 12/17/18 1218 12/17/18 1741 12/17/18 2054 12/18/18 0819 12/18/18 1203  GLUCAP 197* 284* 367* 116* 133*   Hgb A1c Recent Labs    12/16/18 1540  HGBA1C 7.4*   Thyroid function studies Recent Labs    12/16/18 2041  TSH 10.506*   Urinalysis    Component Value Date/Time   COLORURINE YELLOW 12/16/2018 1530   APPEARANCEUR HAZY (A) 12/16/2018 1530   LABSPEC 1.015 12/16/2018 1530   PHURINE 5.0 12/16/2018 1530   GLUCOSEU 150 (A) 12/16/2018 1530   HGBUR LARGE (A) 12/16/2018 1530   BILIRUBINUR NEGATIVE 12/16/2018 [facility] 12/16/2018 1530   PROTEINUR 100 (A) 12/16/2018 1530   NITRITE NEGATIVE 12/16/2018 1530  LEUKOCYTESUR NEGATIVE 12/16/2018 1530   Microbiology Recent Results (from the past 240 hour(s))  Blood Culture (routine x 2)     Status: None   Collection Time: 12/08/18 11:51 PM   Specimen: BLOOD  Result Value Ref Range Status   Specimen Description BLOOD LEFT ANTECUBITAL  Final   Special Requests   Final    BOTTLES DRAWN AEROBIC AND ANAEROBIC  Blood Culture adequate volume   Culture   Final    NO GROWTH 5 DAYS Performed at [facility], [address] [place], [place] [address]    Report Status 12/14/2018 FINAL  Final  Blood Culture (routine x 2)     Status: None   Collection Time: 12/08/18 11:53 PM   Specimen: BLOOD  Result Value Ref Range Status   Specimen Description BLOOD RIGHT ANTECUBITAL  Final   Special Requests   Final    BOTTLES DRAWN AEROBIC AND ANAEROBIC Blood Culture results may not be optimal due to an excessive volume of blood received in culture bottles   Culture   Final    NO GROWTH 5 DAYS Performed at [facility], [place] [address] [place], [place] [address]    Report Status 12/14/2018 FINAL  Final  SARS Coronavirus 2 [facility] order, Performed in [facility] lab) Nasopharyngeal Nasopharyngeal Swab     Status: None   Collection Time: 12/09/18 12:24 AM   Specimen: Nasopharyngeal Swab  Result Value Ref Range Status   SARS Coronavirus 2 NEGATIVE NEGATIVE Final    Comment: (NOTE) If result is NEGATIVE SARS-CoV-2 target nucleic acids are NOT DETECTED. The SARS-CoV-2 RNA is generally detectable in upper and lower  respiratory specimens during the acute phase of infection. The lowest  concentration of SARS-CoV-2 viral copies this assay can detect is 250  copies / mL. A negative result does not preclude SARS-CoV-2 infection  and should not be used as the sole basis for treatment or other  patient management decisions.  A negative result may occur with  improper specimen collection / handling, submission of specimen other  than nasopharyngeal swab, presence of viral mutation(s) within the  areas targeted by this assay, and inadequate number of viral copies  (<250 copies / mL). A negative result must be combined with clinical  observations, patient history, and epidemiological information. If result is POSITIVE SARS-CoV-2 target nucleic acids are DETECTED. The SARS-CoV-2 RNA is generally  detectable in upper and lower  respiratory specimens dur ing the acute phase of infection.  Positive  results are indicative of active infection with SARS-CoV-2.  Clinical  correlation with patient history and other diagnostic information is  necessary to determine patient infection status.  Positive results do  not rule out bacterial infection or co-infection with other viruses. If result is PRESUMPTIVE POSTIVE SARS-CoV-2 nucleic acids MAY BE PRESENT.   A presumptive positive result was obtained on the submitted specimen  and confirmed on repeat testing.  While 2019 novel coronavirus  (SARS-CoV-2) nucleic acids may be present in the submitted sample  additional confirmatory testing may be necessary for epidemiological  and / or clinical management purposes  to differentiate between  SARS-CoV-2 and other Sarbecovirus currently known to infect humans.  If clinically indicated additional testing with an alternate test  methodology [phone]) is advised. The SARS-CoV-2 RNA is generally  detectable in upper and lower respiratory sp ecimens during the acute  phase of infection. The expected result is Negative. Fact Sheet for Patients:  StrictlyIdeas.no Fact Sheet for Healthcare  Providers: BankingDealers.co.za This test is not yet approved or cleared by the [place] and has been authorized for detection and/or diagnosis of SARS-CoV-2 by FDA under an Emergency Use Authorization (EUA).  This EUA will remain in effect (meaning this test can be used) for the duration of the COVID-19 declaration under Section 564(b)(1) of the Act, 21 U.S.C. section 360bbb-3(b)(1), unless the authorization is terminated or revoked sooner. Performed at [facility], [place] [address], [place], [place] [address]   Urine Culture     Status: None   Collection Time: 12/09/18  1:27 AM   Specimen: Urine, Clean Catch  Result Value Ref Range Status   Specimen  Description URINE, CLEAN CATCH  Final   Special Requests NONE  Final   Culture   Final    NO GROWTH Performed at [facility], [place] [address] [place], [place] [address]    Report Status 12/10/2018 FINAL  Final  Urine Culture     Status: None   Collection Time: 12/16/18  3:51 PM   Specimen: Urine, Clean Catch  Result Value Ref Range Status   Specimen Description URINE, CLEAN CATCH  Final   Special Requests NONE  Final   Culture   Final    NO GROWTH Performed at [facility], [place] [address], [place], [place] [address]    Report Status 12/17/2018 FINAL  Final  SARS CORONAVIRUS 2 (TAT 6-24 HRS) Nasopharyngeal Nasopharyngeal Swab     Status: None   Collection Time: 12/16/18  4:45 PM   Specimen: Nasopharyngeal Swab  Result Value Ref Range Status   SARS Coronavirus 2 NEGATIVE NEGATIVE Final    Comment: (NOTE) SARS-CoV-2 target nucleic acids are NOT DETECTED. The SARS-CoV-2 RNA is generally detectable in upper and lower respiratory specimens during the acute phase of infection. Negative results do not preclude SARS-CoV-2 infection, do not rule out co-infections with other pathogens, and should not be used as the sole basis for treatment or other patient management decisions. Negative results must be combined with clinical observations, patient history, and epidemiological information. The expected result is Negative. Fact Sheet for Patients: SugarRoll.be Fact Sheet for Healthcare Providers: https://www.woods-mathews.com/ This test is not yet approved or cleared by the [place] FDA and  has been authorized for detection and/or diagnosis of SARS-CoV-2 by FDA under an Emergency Use Authorization (EUA). This EUA will remain  in effect (meaning this test can be used) for the duration of the COVID-19 declaration under Section 56 4(b)(1) of the Act, 21 U.S.C. section 360bbb-3(b)(1), unless the authorization is terminated  or revoked sooner. Performed at [facility], [place] [address], [place], [place] [address]   Culture, blood (routine x 2)     Status: None (Preliminary result)   Collection Time: 12/16/18  8:31 PM   Specimen: BLOOD  Result Value Ref Range Status   Specimen Description BLOOD LEFT ANTECUBITAL  Final   Special Requests   Final    BOTTLES DRAWN AEROBIC AND ANAEROBIC Blood Culture results may not be optimal due to an excessive volume of blood received in culture bottles   Culture   Final    NO GROWTH 2 DAYS Performed at [facility], [place] [address] [place],  [address]    Report Status PENDING  Incomplete  Culture, blood (routine x 2)     Status: None (Preliminary result)   Collection Time: 12/16/18  8:41 PM   Specimen: BLOOD LEFT HAND  Result Value  Ref Range Status   Specimen Description BLOOD LEFT HAND  Final   Special Requests   Final    BOTTLES DRAWN AEROBIC AND ANAEROBIC Blood Culture adequate volume   Culture   Final    NO GROWTH 2 DAYS Performed at [facility], [address], [place], [place] [address]    Report Status PENDING  Incomplete   Time coordinating discharge: Over 30 minutes  SIGNED:  Little Ishikawa, DO [facility] 12/18/2018, 2:51 PM Pager   If 7PM-7AM, please contact night-coverage [email or web address] Password [email or web address]

## 2018-12-18 NOTE — TOC Transition Note (Signed)
Transition of Care [facility]) - CM/SW Discharge Note   Patient Details  Name: David [name]. MRN: [id number] Date of Birth: 21-Sep-1940  Transition of Care [facility] CM/SW Contact:  Alexander Mt, [place] Phone Number: 12/18/2018, 3:06 PM   Clinical Narrative:    CSW spoke with [facility], admissions liaison with CIR. Pt has been accepted by CIR and is stable for transfer today per MD. Urban Gibson will inform pt daughter Dr. [name].   Final next level of care: IP Rehab Facility Barriers to Discharge: Barriers Resolved   Patient Goals and CMS Choice Patient states their goals for this hospitalization and ongoing recovery are:: for him to either go to CIR or have increased services in the home CMS Medicare.gov Compare Post Acute Care list provided to:: Other (Comment Required)(n/a) Choice offered to / list presented to : Patient, Adult Children  Discharge Placement   Patient chooses bed at: Other - please specify in the comment section below:([facility]) Name of family member notified: pt daughter Dr. [name] Patient and family notified of of transfer: 12/18/18  Discharge Plan and Services In-house Referral: Clinical Social Work Discharge Planning Services: CM Consult Post Acute Care Choice: IP Rehab, Resumption of Svcs/PTA Provider, Home Health                               Social Determinants of Health (SDOH) Interventions     Readmission Risk Interventions Readmission Risk Prevention Plan 12/18/2018  Transportation Screening Complete  Medication Review Press photographer) Complete  PCP or Specialist appointment within 3-5 days of discharge Not Complete  PCP/Specialist Appt Not Complete comments still arranging disposition  [place] or [place] Complete  SW Recovery Care/Counseling Consult Complete  [place] Patient Refused  Some recent data might be hidden

## 2018-12-18 NOTE — Discharge Instructions (Signed)
°Carbohydrate Counting For People With Diabetes ° °Why Is Carbohydrate Counting Important? °• Counting carbohydrate servings may help you control your blood glucose level so that you feel better.  °• The balance between the carbohydrates you eat and insulin determines what your blood glucose level will be after eating.  °• Carbohydrate counting can also help you plan your meals. °Which Foods Have Carbohydrates? °Foods with carbohydrates include: °• Breads, crackers, and cereals  °• Pasta, rice, and grains  °• Starchy vegetables, such as potatoes, corn, and peas  °• Beans and legumes  °• Milk, soy milk, and yogurt  °• Fruits and fruit juices  °• Sweets, such as cakes, cookies, ice cream, jam, and jelly °Carbohydrate Servings °In diabetes meal planning, 1 serving of a food with carbohydrate has about 15 grams of carbohydrate: °• Check serving sizes with measuring cups and spoons or a food scale.  °• Read the Nutrition Facts on food labels to find out how many grams of carbohydrate are in foods you eat. °The food lists in this handout show portions that have about 15 grams of carbohydrate. ° °Tips °Meal Planning Tips °• An Eating Plan tells you how many carbohydrate servings to eat at your meals and snacks. For many adults, eating 3 to 5 servings of carbohydrate foods at each meal and 1 or 2 carbohydrate servings for each snack works well.  °• In a healthy daily Eating Plan, most carbohydrates come from:  °• At least 6 servings of fruits and nonstarchy vegetables  °• At least 6 servings of grains, beans, and starchy vegetables, with at least 3 servings from whole grains  °• At least 2 servings of milk or milk products °• Check your blood glucose level regularly. It can tell you if you need to adjust when you eat carbohydrates.  °• Eating foods that have fiber, such as whole grains, and having very few salty foods is good for your health.  °• Eat 4 to 6 ounces of meat or other protein foods (such as soybean burgers)  each day. Choose low-fat sources of protein, such as lean beef, lean pork, chicken, fish, low-fat cheese, or vegetarian foods such as soy.  °• Eat some healthy fats, such as olive oil, canola oil, and nuts.  °• Eat very little saturated fats. These unhealthy fats are found in butter, cream, and high-fat meats, such as bacon and sausage.  °• Eat very little or no trans fats. These unhealthy fats are found in all foods that list “partially hydrogenated oil” as an ingredient.  °Label Reading Tips °The Nutrition Facts panel on a label lists the grams of total carbohydrate in 1 standard serving. The label's standard serving may be larger or smaller than 1 carbohydrate serving. °To figure out how many carbohydrate servings are in the food: °• First, look at the label's standard serving size.  °• Check the grams of total carbohydrate. This is the amount of carbohydrate in 1 standard serving.  °• Divide the grams of total carbohydrate by 15. This number equals the number of carbohydrate servings in 1 standard serving. Remember: 1 carbohydrate serving is 15 grams of carbohydrate.  °• Note: You may ignore the grams of sugars on the Nutrition Facts panel because they are included in the grams of total carbohydrate. ° °Foods Recommended °1 serving = about 15 grams of carbohydrate °Starches °• 1 slice bread (1 ounce)  °• 1 tortilla (6-inch size)  °• ¼ large bagel (1 ounce)  °• 2 taco shells (5-inch   size)  °• ½ hamburger or hot dog bun (¾ ounce)  °• ¾ cup ready-to-eat unsweetened cereal  °• ½ cup cooked cereal  °• 1 cup broth-based soup  °• 4 to 6 small crackers  °• 1/3 cup pasta or rice (cooked)  °• ½ cup beans, peas, corn, sweet potatoes, winter squash, or mashed or boiled potatoes (cooked)  °• ¼ large baked potato (3 ounces)  °• ¾ ounce pretzels, potato chips, or tortilla chips  °• 3 cups popcorn (popped) °Fruit °• 1 small fresh fruit (¾ to 1 cup)  °• ½ cup canned or frozen fruit  °• 2 tablespoons dried fruit (blueberries,  cherries, cranberries, mixed fruit, raisins)  °• 17 small grapes (3 ounces)  °• 1 cup melon or berries  °• ½ cup unsweetened fruit juice °Milk °• 1 cup fat-free or reduced-fat milk  °• 1 cup soy milk  °• 2/3 cup (6 ounces) nonfat yogurt sweetened with sugar-free sweetener °Sweets and Desserts °• 2-inch square cake (unfrosted)  °• 2 small cookies (2/3 ounce)  °• ½ cup ice cream or frozen yogurt  °• ¼ cup sherbet or sorbet  °• 1 tablespoon syrup, jam, jelly, table sugar, or honey  °• 2 tablespoons light syrup °Other Foods °• Count 1 cup raw vegetables or ½ cup cooked nonstarchy vegetables as zero (0) carbohydrate servings or “free” foods. If you eat 3 or more servings at one meal, count them as 1 carbohydrate serving.  °• Foods that have less than 20 calories in each serving also may be counted as zero carbohydrate servings or “free” foods.  °• Count 1 cup of casserole or other mixed foods as 2 carbohydrate servings. ° °Carbohydrate Counting for People with Diabetes Sample 1-Day Menu  °Breakfast 1 extra-small banana (1 carbohydrate serving)  °1 cup low-fat or fat-free milk (1 carbohydrate serving)  °1 slice whole wheat bread (1 carbohydrate serving)  °1 teaspoon margarine  °Lunch 2 ounces turkey slices  °2 slices whole wheat bread (2 carbohydrate servings)  °2 lettuce leaves  °4 celery sticks  °4 carrot sticks  °1 medium apple (1 carbohydrate serving)  °1 cup low-fat or fat-free milk (1 carbohydrate serving)  °Afternoon Snack 2 tablespoons raisins (1 carbohydrate serving)  °3/4 ounce unsalted mini pretzels (1 carbohydrate serving)  °Evening Meal 3 ounces lean roast beef  °1/2 large baked potato (2 carbohydrate servings)  °1 tablespoon reduced-fat sour cream  °1/2 cup green beans  °1 tablespoon light salad dressing  °1 whole wheat dinner roll (1 carbohydrate serving)  °1 teaspoon margarine  °1 cup melon balls (1 carbohydrate serving)  °Evening Snack 2 tablespoons unsalted nuts  ° °Carbohydrate Counting for People with  Diabetes Vegan Sample 1-Day Menu  °Breakfast 1 cup cooked oatmeal (2 carbohydrate servings)  °½ cup blueberries (1 carbohydrate serving)  °2 tablespoons flaxseeds  °1 cup soymilk fortified with calcium and vitamin D  °1 cup coffee  °Lunch 2 slices whole wheat bread (2 carbohydrate servings)  °½ cup baked tofu  °¼ cup lettuce  °2 slices tomato  °2 slices avocado  °½ cup baby carrots  °1 orange (1 carbohydrate serving)  °1 cup soymilk fortified with calcium and vitamin D   °Evening Meal Burrito made with: 1 6-inch corn tortilla (1 carbohydrate serving)  °1 cup refried vegetarian beans (1 carbohydrate serving)  °¼ cup chopped tomatoes  °¼ cup lettuce  °¼ cup salsa  °1/3 cup brown rice (1 carbohydrate serving)  °1 tablespoon olive oil for rice  °½   cup zucchini   °Evening Snack 6 small whole grain crackers (1 carbohydrate serving)  °2 apricots (½ carbohydrate serving)  °¼ cup unsalted peanuts (½ carbohydrate serving)   ° ° °Carbohydrate Counting for People with Diabetes Vegetarian (Lacto-Ovo) Sample 1-Day Menu  °Breakfast 1 cup cooked oatmeal (2 carbohydrate servings)  °½ cup blueberries (1 carbohydrate serving)  °2 tablespoons flaxseeds  °1 egg  °1 cup 1% milk (1 carbohydrate serving)  °1 cup coffee  °Lunch 2 slices whole wheat bread (2 carbohydrate servings)  °2 ounces low-fat cheese  °¼ cup lettuce  °2 slices tomato  °2 slices avocado  °½ cup baby carrots  °1 orange (1 carbohydrate serving)  °1 cup unsweetened tea  °Evening Meal Burrito made with: 1 6-inch corn tortilla (1 carbohydrate serving)  °½ cup refried vegetarian beans (1 carbohydrate serving)  °¼ cup tomatoes  °¼ cup lettuce  °¼ cup salsa  °1/3 cup brown rice (1 carbohydrate serving)  °1 tablespoon olive oil for rice  °½ cup zucchini  °1 cup 1% milk (1 carbohydrate serving)  °Evening Snack 6 small whole grain crackers (1 carbohydrate serving)  °2 apricots (½ carbohydrate serving)  °¼ cup unsalted peanuts (½ carbohydrate serving)   ° °Copyright 2020 © Academy  of Nutrition and Dietetics. All rights reserved. ° °Using Nutrition Labels: Carbohydrate ° °• Serving Size  °• Look at the serving size. All the information on the label is based on this portion. °• Servings Per Container  °• The number of servings contained in the package. °• Guidelines for Carbohydrate  °• Look at the total grams of carbohydrate in the serving size.  °• 1 carbohydrate choice = 15 grams of carbohydrate. °Range of Carbohydrate Grams Per Choice  °Carbohydrate Grams/Choice Carbohydrate Choices  °6-10 ½  °11-20 1  °21-25 1½  °26-35 2  °36-40 2½  °41-50 3  °51-55 3½  °56-65 4  °66-70 4½  °71-80 5  ° ° °Copyright 2020 © Academy of Nutrition and Dietetics. All rights reserved. ° °

## 2018-12-18 NOTE — Progress Notes (Signed)
Brief Nutrition Education Note  RD consulted for nutrition education regarding diabetes.   Lab Results  Component Value Date   HGBA1C 7.4 (H) 12/16/2018   PTA DM medications are none. Per ADA's Standards of Medical Care for Diabetes, glycemic targets for elderly patients who are otherwise healthy (few medical impairments) and cognitively intact should be less stringent (Hgb A1c <7.5).   Labs reviewed: CBGS: [id number] (inpatient orders for glycemic control are 2.5 mg glipizide daily, 0-15 units insulin aspart TID with meals, and 0-5 units insulin aspart q HS).   Reviewed [facility] admission coordinator note. Pt has been confused and agitated. He reported "I can't remember anything". Pt not appropriate for education at this time. Pt resided at [facility] facility P[facility].   Pt with possible [facility] admit. RD can be available to provide further education to family/caregivers if warranted.   RD provided "Carbohydrate Counting for People with Diabetes" handout from the Academy of Nutrition and Dietetics in AVS/ discharge summary.   Body mass index is 30.21 kg/m. Pt meets criteria for obesity, class I based on current BMI.  Current diet order is regular, patient is consuming approximately 100% of meals at this time. Labs and medications reviewed. No further nutrition interventions warranted at this time. RD contact information provided. If additional nutrition issues arise, please re-consult RD.  David [name], RD, LDN, [place] Registered Dietitian II Certified Diabetes Care and Education Specialist Pager: [phone] After hours Pager: [phone]

## 2018-12-18 NOTE — TOC Initial Note (Signed)
Transition of Care (TOC) - Initial/Assessment Note    Patient Details  Name: David [name]. MRN: [id number] Date of Birth: 1940-11-11  Transition of Care [facility]) CM/SW Contact:    Alexander Mt, [place] Phone Number: 12/18/2018, 11:38 AM  Clinical Narrative:                 CSW spoke with pt at bedside, pt daughter Dr. [name] also present and engaged in conversation. CSW introduced self, role, and reason for visit. Pt from [place] where he lives with his wife who has Alzheimers. They have assistance a few hours in the morning but per pt daughter they often get behind in their hydration and ambulation without supervision and supportive encouragement. Pt has been to SNF section of [facility] before and pt daughter would like to avoid that if possible (per her report there is a COVID outbreak there). We discussed CIR had signed off as it appeared he was from ALF with plans for SNF. If it is not possible for CIR to take pt for short rehab then pt daughter prefers that pt return home to independent living with supplemental aide and therapy services. CSW will reach out to both avenues to assess potential for disposition.   CSW spoke with Pamala Hurry with CIR and requested that MD place CIR consult for full re-evaluation. CSW will also reach out to [facility] to assess in home services that pt may be eligible for.   Expected Discharge Plan: IP Rehab Facility Barriers to Discharge: Continued Medical Work up   Patient Goals and CMS Choice Patient states their goals for this hospitalization and ongoing recovery are:: for him to either go to CIR or have increased services in the home CMS Medicare.gov Compare Post Acute Care list provided to:: Other (Comment Required)(n/a) Choice offered to / list presented to : Patient, Adult Children  Expected Discharge Plan and Services Expected Discharge Plan: [place] In-house Referral: Clinical Social Work Discharge Planning  Services: CM Consult Post Acute Care Choice: IP Rehab, Resumption of Svcs/PTA Provider, Home Health Living arrangements for the past 2 months: [place]   Prior Living Arrangements/Services Living arrangements for the past 2 months: [place] Lives with:: Spouse Patient language and need for interpreter reviewed:: Yes(no needs) Do you feel safe going back to the place where you live?: Yes      Need for Family Participation in Patient Care: Yes (Comment)(assistance as able with ADL/IADLs; supervision) Care giver support system in place?: Yes (comment)(adult daughter and facility staff) Current home services: DME, Homehealth aide Criminal Activity/Legal Involvement Pertinent to Current Situation/Hospitalization: No - Comment as needed  Activities of Daily Living Home Assistive Devices/Equipment: CPAP(uses CPAP at [facility]) ADL Screening (condition at time of admission) Patient's cognitive ability adequate to safely complete daily activities?: No Is the patient deaf or have difficulty hearing?: No Does the patient have difficulty seeing, even when wearing glasses/contacts?: No Does the patient have difficulty concentrating, remembering, or making decisions?: Yes Patient able to express need for assistance with ADLs?: Yes Does the patient have difficulty dressing or bathing?: No Independently performs ADLs?: Yes (appropriate for developmental age) Does the patient have difficulty walking or climbing stairs?: Yes Weakness of Legs: Both Weakness of Arms/Hands: None  Permission Sought/Granted Permission sought to share information with : Family Supports, Customer service manager Permission granted to share information with : Yes, Verbal Permission Granted  Share Information with NAME: Dr. Silas [place]  Permission granted  to share info w AGENCY: [place]; CIR  Permission granted to share info w Relationship: daughter  Permission granted to  share info w Contact Information: [phone]  Emotional Assessment Appearance:: Appears stated age Attitude/Demeanor/Rapport: Lethargic Affect (typically observed): Accepting, Adaptable, Appropriate Orientation: : Oriented to Situation, Oriented to  Time, Oriented to Place, Oriented to Self Alcohol / Substance Use: Not Applicable Psych Involvement: No (comment)  Admission diagnosis:  Painful swelling of joint [M25.40] Hyperglycemia [R73.9] Generalized weakness [R53.1] Acute gout of left foot, unspecified cause [M10.9] Patient Active Problem List   Diagnosis Date Noted  . AKI (acute kidney injury) ([place]) 12/17/2018  . DM type 2 (diabetes mellitus, type 2) ([place]) 12/17/2018  . Weakness generalized 12/16/2018  . Altered mental status 12/09/2018  . Sepsis ([place]) 12/09/2018  . Fever 12/09/2018  . Neutropenia with fever ([place]) 12/09/2018  . Thrombocytopenia ([place]) 12/09/2018  . Acute pain of left wrist 11/15/2018  . Insomnia 10/14/2018  . Heart block AV complete ([place]) 10/01/2018  . Presence of biventricular cardiac pacemaker 10/01/2018  . Hypogonadism in male 04/18/2018  . Dandruff in adult 03/27/2018  . Dyslipidemia 03/27/2018  . Acute pain of right shoulder 03/11/2018  . Elevated PSA 12/24/2017  . Prediabetes 12/24/2017  . Hx of CABG 07/17/2017  . Alcohol use 04/19/2017  . Cognitive impairment 02/25/2017  . History of implantation of joint prosthesis of elbow 02/12/2017  . Osteoarthritis of knee 02/12/2017  . Hypercalcemia 11/01/2016  . Nontraumatic cortical hemorrhage of left cerebral hemisphere ([place]) 06/27/2016  . Hematochezia 05/06/2016  . Advanced care planning/counseling discussion 05/03/2016  . Atherosclerosis of native coronary artery of native heart with stable angina pectoris ([place]) 04/24/2016  . Ischemic cardiomyopathy 04/24/2016  . Acquired hypothyroidism 11/24/2015  . Metastatic melanoma to lung ([place])   . MDD (major depressive disorder), recurrent episode, moderate  ([place])   . Benign prostatic hyperplasia   . Metastasis to brain ([place]) 10/15/2015  . Malignant melanoma ([place]) 09/22/2015  . Lung mass   . Chronic kidney disease, stage III (moderate) (HCC)   . Overweight with body mass index (BMI) 25.0-29.9 08/26/2015  . Incontinence 08/26/2015  . Hemorrhagic stroke ([place]) 08/26/2015  . Hemiplegia and hemiparesis following nontraumatic intracerebral hemorrhage affecting right dominant side ([place]) 08/26/2015  . Aphasia following nontraumatic intracerebral hemorrhage 08/26/2015  . Gait disturbance, post-stroke 08/26/2015  . Benign essential HTN   . OSA on CPAP   . Biventricular ICD (implantable cardioverter-defibrillator) in place   . ICH (intracerebral hemorrhage) (HCC) - L frontal due to HTN vs CAA 08/22/2015   PCP:  [name], MD Pharmacy:   [place], [place] - [facility] [address] [place] [address] Phone: [phone] Fax: [phone]     Social Determinants of Health (SDOH) Interventions    Readmission Risk Interventions No flowsheet data found.

## 2018-12-18 NOTE — PMR Pre-admission (Signed)
PMR Admission Coordinator Pre-Admission Assessment  Patient: David [name]. is an 78 y.o., male MRN: [id number] DOB: May 15, 1940 Height: _0  (175.3 cm) Weight: 92.8 kg  Insurance Information HMO:     PPO:      PCP:      IPA:      80/20:      OTHER:  PRIMARY: Medicare A and B      Policy#: [id number]      Subscriber: patient CM Name:       Phone#:      Fax#:  Pre-Cert#: verified Civil engineer, contracting:  Benefits:  Phone #:      Name:  Eff. Date: 06/08/2004     Deduct: $1408      Out of Pocket Max: n/a      Life Max: n/a CIR: 100%      SNF: 20 full days Outpatient:      Co-Pay:  Home Health:       Co-Pay:  DME:      Co-Pay:  Providers: pt choice SECONDARY: [place]      Policy#: [id number]      Subscriber:  CM Name:       Phone#:      Fax#:  Pre-Cert#:       Employer:  Benefits:  Phone #:      Name:  Eff. Date:      Deduct:       Out of Pocket Max:       Life Max:  CIR:       SNF:  Outpatient:      Co-Pay:  Home Health:       Co-Pay:  DME:      Co-Pay:   Medicaid Application Date:       Case Manager:  Disability Application Date:       Case Worker:   The "Data Collection Information Summary" for patients in Inpatient Rehabilitation Facilities with attached "Privacy Act [place] Records" was provided and verbally reviewed with: Patient and Family  Emergency Contact Information Contact Information    Name Relation Home Work [place] Daughter   [phone]   Swango,Jacqueline Spouse [phone]        Current Medical History  Patient Admitting Diagnosis: debility   History of Present Illness: David [name]. David [name], Gar[place]. is a 78 year old right-handed male with history of nontraumatic intracerebral hemorrhage secondary to hypertensive crisis affecting the right side in 2017 receiving inpatient rehab services 08/26/2015 to 09/10/2015 and was discharged to skilled nursing facility.  Patient also with history of prostate cancer, melanoma with  brain mets diagnosed 2017, status post left frontal craniotomy, tumor excision and radiation therapy to the brain and chemotherapy complicated by leukopenia, thrombocytopenia followed by Dr. Burr Medico and Dr. Mickeal Skinner, hypothyroidism, hypertension, CAD with CABG, biventricular ICD.  Patient recently hospitalized 12/08/2018 for right foot cellulitis, pancytopenia and did receive Granix on 12/10/2018.  Patient was placed on IV antibiotics as well as p.o. steroids and discharged home on p.o. doxycycline for 3 days. Presented 12/16/2018 with polyuria, polydipsia, generalized weakness and body aches x3 days.  Noted blood sugars in the 400s.  COVID negative, urinalysis negative nitrite with urine culture no growth, CK 19, hemoglobin A1c 7.4, WBC 26,600, creatinine 2.23 with baseline 2.04 in late August however reported 09/23/2018 creatinine 1.56.  Chest x-ray negative, recent renal ultrasound showed some increased cortical echogenicity and mild cortical thinning bilaterally bilateral complex and simple appearing renal cyst.  Normal  appearance of the urinary bladder.  Patient was placed on gentle IV fluids.  Placed on low-dose prednisone for suspected gout left foot with uric acid level within normal limits sedimentation rate mildly elevated 26.  Leukocytosis felt to be related to recent Granix injection.  Subcutaneous Lovenox for DVT prophylaxis.  Renal function stabilizing 1.99 creatinine as well as WBC trending down to 20,000.  Therapy evaluations completed patient was recommended for a comprehensive rehab program.    Patient's medical record from [facility] has been reviewed by the rehabilitation admission coordinator and physician.  Past Medical History  Past Medical History:  Diagnosis Date  . Anginal pain ([place])   . Arthritis    mostly hands  . Benign familial hematuria   . BPH (benign prostatic hypertrophy)   . Brain cancer ([place])    melanoma with brain met  . Coronary artery disease   . GERD  (gastroesophageal reflux disease)   . High cholesterol   . Hypertension   . Hypothyroidism   . Intracerebral hemorrhage ([place])   . Melanoma ([place])   . Metastatic melanoma to lung ([place])   . Mood disorder ([place])   . Myocardial infarction ([place])   . Pacemaker    due to syncope, 3rd degree HB (upgrade to [facility]. Jude CRT-P 02/25/13 (Dr. Uvaldo Rising)  . Rectal bleed    due to NSAIDS  . Renal disorder   . Sepsis ([place]) 12/09/2018  . Skin cancer    melanoma  . Sleep apnea    uses CPAP  . Stroke [facility])     Family History   family history includes Cancer in his maternal grandmother; Cancer (age of onset: 47) in his mother; Heart attack in his father; Heart disease in his father; Hypertension in his father and mother; Rheum arthritis in his sister.  Prior Rehab/Hospitalizations Has the patient had prior rehab or hospitalizations prior to admission? Yes  Has the patient had major surgery during 100 days prior to admission? prostate biopsy   Current Medications  Current Facility-Administered Medications:  .  0.9 %  sodium chloride infusion, , Intravenous, Continuous, Pahwani, Rinka R, MD, Last Rate: 125 mL/hr at 12/18/18 1243 .  acetaminophen (TYLENOL) tablet 650 mg, 650 mg, Oral, Q6H PRN, 650 mg at 12/16/18 2241 **OR** acetaminophen (TYLENOL) suppository 650 mg, 650 mg, Rectal, Q6H PRN, Pahwani, Rinka R, MD .  amLODipine (NORVASC) tablet 5 mg, 5 mg, Oral, Daily, Pahwani, Rinka R, MD, 5 mg at 12/18/18 0926 .  aspirin EC tablet 81 mg, 81 mg, Oral, QPM, Pahwani, Rinka R, MD, 81 mg at 12/17/18 1820 .  bisacodyl (DULCOLAX) suppository 10 mg, 10 mg, Rectal, Daily PRN, Samuella Cota, MD .  citalopram (CELEXA) tablet 10 mg, 10 mg, Oral, Daily, Pahwani, Rinka R, MD, 10 mg at 12/18/18 0926 .  enoxaparin (LOVENOX) injection 40 mg, 40 mg, Subcutaneous, Q24H, Karren Cobble, RPH, 40 mg at 12/17/18 1820 .  glipiZIDE (GLUCOTROL) tablet 2.5 mg, 2.5 mg, Oral, QAC breakfast, Samuella Cota, MD, 2.5 mg at  12/18/18 6734 .  insulin aspart (novoLOG) injection 0-15 Units, 0-15 Units, Subcutaneous, TID WC, Pahwani, Rinka R, MD, 2 Units at 12/18/18 1240 .  insulin aspart (novoLOG) injection 0-5 Units, 0-5 Units, Subcutaneous, QHS, Pahwani, Rinka R, MD, 5 Units at 12/17/18 2140 .  levothyroxine (SYNTHROID) tablet 137 mcg, 137 mcg, Oral, Q0600, Pahwani, Rinka R, MD, 137 mcg at 12/18/18 0628 .  metoprolol succinate (TOPROL-XL) 24 hr tablet 75 mg, 75 mg, Oral, BID, Pahwani,  Michell Heinrich, MD, 75 mg at 12/18/18 0926 .  ondansetron (ZOFRAN) tablet 4 mg, 4 mg, Oral, Q6H PRN **OR** ondansetron (ZOFRAN) injection 4 mg, 4 mg, Intravenous, Q6H PRN, Pahwani, Rinka R, MD .  polyethylene glycol (MIRALAX / GLYCOLAX) packet 17 g, 17 g, Oral, BID, Samuella Cota, MD, 17 g at 12/18/18 0926 .  predniSONE (DELTASONE) tablet 40 mg, 40 mg, Oral, Q breakfast, Pahwani, Rinka R, MD, 40 mg at 12/18/18 0808 .  rosuvastatin (CRESTOR) tablet 20 mg, 20 mg, Oral, QHS, Pahwani, Rinka R, MD, 20 mg at 12/17/18 2141 .  senna (SENOKOT) tablet 8.6 mg, 1 tablet, Oral, QHS, Samuella Cota, MD, 8.6 mg at 12/17/18 2141 .  traZODone (DESYREL) tablet 25 mg, 25 mg, Oral, QHS PRN, Pahwani, Rinka R, MD, 25 mg at 12/16/18 2241  Facility-Administered Medications Ordered in Other Encounters:  .  0.9 %  sodium chloride infusion, 1,000 mL, Intravenous, Once, [name], MD  Patients Current Diet:  Diet Order            Diet - low sodium heart healthy        Diet regular Room service appropriate? Yes; Fluid consistency: Thin  Diet effective now              Precautions / Restrictions Precautions Precautions: Fall Restrictions Weight Bearing Restrictions: No   Has the patient had 2 or more falls or a fall with injury in the past year? No  Prior Activity Level Limited Community (1-2x/wk): indep without device, was still driving and going out for appointments  Prior Functional Level Self Care: Did the patient need help bathing, dressing,  using the toilet or eating? Independent  Indoor Mobility: Did the patient need assistance with walking from room to room (with or without device)? Independent  Stairs: Did the patient need assistance with internal or external stairs (with or without device)? Independent  Functional Cognition: Did the patient need help planning regular tasks such as shopping or remembering to take medications? Needed some help  Home Assistive Devices / [place] Devices/Equipment: CPAP(uses CPAP at [facility]) Home Equipment: None  Prior Device Use: Indicate devices/aids used by the patient prior to current illness, exacerbation or injury? None of the above  Current Functional Level Cognition  Overall Cognitive Status: Impaired/Different from baseline Orientation Level: Oriented X4 Safety/Judgement: Decreased awareness of safety General Comments: Pt alert and oriented X3. Pt demonstrated slowed processing throughout.    Extremity Assessment (includes Sensation/Coordination)  Upper Extremity Assessment: Generalized weakness  Lower Extremity Assessment: Generalized weakness    ADLs       Mobility  Overal bed mobility: Needs Assistance Bed Mobility: Supine to Sit, Sit to Supine Supine to sit: Mod assist Sit to supine: Mod assist General bed mobility comments: supine>sit with mod assist for trunk elevation, heavy use of bedrails. Sit>supine with mod assist for LE management.    Transfers  Overall transfer level: Needs assistance Equipment used: Rolling walker (2 wheeled), None Transfers: Sit to/from Stand Sit to Stand: Min assist General transfer comment: Pt unable to come up to standing without AD despite assist from therapist. With RW pt performed sit<>stand from EOB with min assist    Ambulation / Gait / Stairs / Wheelchair Mobility  Ambulation/Gait Ambulation/Gait assistance: Herbalist (Feet): 80 Feet Assistive device: Rolling walker (2 wheeled) Gait  Pattern/deviations: Trunk flexed, Decreased step length - right, Decreased stance time - right General Gait Details: Pt used RW for ambulation this session, decreased  step length and foot clearance noted R LE, cues for increased step length and upright posture Gait velocity: Decreased Gait velocity interpretation: <1.31 ft/sec, indicative of household ambulator    Posture / Balance Dynamic Sitting Balance Sitting balance - Comments: static sitting balance supervision, dynamic sitting balance min guard assist Balance Overall balance assessment: Needs assistance Sitting-balance support: No upper extremity supported, Feet supported Sitting balance-Leahy Scale: Fair Sitting balance - Comments: static sitting balance supervision, dynamic sitting balance min guard assist Standing balance support: Bilateral upper extremity supported, During functional activity Standing balance-Leahy Scale: Poor Standing balance comment: Reliant on BUE support using RW, delayed balance reactions    Special needs/care consideration BiPAP/CPAP cpap at night CPM no Continuous Drip IV no Dialysis no        Days n/a Life Vest no Oxygen no Special Bed no Trach Size no Wound Vac (area) no      Location n/a Skin cellulitis R foot                       Bowel mgmt: continent, last BM 9/5 Bladder mgmt: continent Diabetic mgmt: yes, newly diagnosed diabetic (this admission), will need diabetes education Behavioral consideration no Chemo/radiation no   Previous Home Environment (from acute therapy documentation) Living Arrangements: Spouse/significant other Available Help at Discharge: Family, Available PRN/intermittently Type of Home: House Home Layout: One level Home Access: Stairs to enter Entrance Stairs-Rails: Right Entrance Stairs-Number of Steps: 2 Bathroom Shower/Tub: Multimedia programmer: Handicapped height Home Care Services: No Additional Comments: Pt's wife unable to physically assist.  Report they live at [place]   Discharge Living Setting Plans for Discharge Living Setting: Other (Comment)(Indep Living Facility ([place])) Type of Home at Discharge: Independent living facility Discharge Home Layout: One level Discharge Home Access: Level entry Discharge Bathroom Shower/Tub: Walk-in shower Discharge Bathroom Toilet: Handicapped height Discharge Bathroom Accessibility: Yes How Accessible: Accessible via wheelchair Does the patient have any problems obtaining your medications?: No  Social/Family/Support Systems Patient Roles: Spouse Anticipated Caregiver: hired caregivers Anticipated Ambulance person Information: [name] (daughter) is contact: [phone] Caregiver Availability: 24/7 Discharge Plan Discussed with Primary Caregiver: Yes Is Caregiver In Agreement with Plan?: Yes Does Caregiver/Family have Issues with Lodging/Transportation while Pt is in Rehab?: No  Goals/Additional Needs Patient/Family Goal for Rehab: PT/OT supervision Expected length of stay: 5-7 days Dietary Needs: reg/thin Pt/Family Agrees to Admission and willing to participate: Yes Program Orientation Provided & Reviewed with Pt/Caregiver Including Roles  & Responsibilities: Yes  Decrease burden of Care through IP rehab admission: n/a  Possible need for SNF placement upon discharge: No. Plan for short stay on CIR to maximize independence, then return back to ILF with wife and 24/7 hired caregivers.  Plan discussed with pt's daughter, Silas [place], on 9/9, and she is in agreement.   Patient Condition: I have reviewed medical records from [facility], spoken with CM, and patient and daughter. I met with patient at the bedside and discussed via phone for inpatient rehabilitation assessment.  Patient will benefit from ongoing PT and OT, can actively participate in 3 hours of therapy a day 5 days of the week, and can make measurable gains during the admission.  Patient will also  benefit from the coordinated team approach during an Inpatient Acute Rehabilitation admission.  The patient will receive intensive therapy as well as Rehabilitation physician, nursing, social worker, and care management interventions.  Due to safety, skin/wound care, disease management, medication administration, pain management and  patient education the patient requires 24 hour a day rehabilitation nursing.  The patient is currently min to mod with mobility and basic ADLs.  Discharge setting and therapy post discharge at home with home health is anticipated.  Patient has agreed to participate in the Acute Inpatient Rehabilitation Program and will admit today.  Preadmission Screen Completed By:  Michel Santee, PT, DPT 12/18/2018 3:16 PM ______________________________________________________________________   Discussed status with Dr. Letta Pate on 12/18/18  at 3:29 PM  and received approval for admission today.  Admission Coordinator:  Michel Santee, PT, DPT, time 3:29 PM Sudie Grumbling 12/18/18    Assessment/Plan: Diagnosis: Debility secondary to acute exacerbation of chronic renal failure 1. Does the need for close, 24 hr/day Medical supervision in concert with the patient's rehab needs make it unreasonable for this patient to be served in a less intensive setting? Yes 2. Co-Morbidities requiring supervision/potential complications: New onset diabetes, history of frontal hemorrhage and metastatic disease to brain 3. Due to bladder management, bowel management, safety, skin/wound care, disease management, medication administration, pain management and patient education, does the patient require 24 hr/day rehab nursing? Yes 4. Does the patient require coordinated care of a physician, rehab nurse, PT (1-2 hrs/day, 5 days/week), OT (1-2 hrs/day, 5 days/week) and SLP (0.5-1 hrs/day, 5 days/week) to address physical and functional deficits in the context of the above medical diagnosis(es)? Yes Addressing  deficits in the following areas: balance, endurance, locomotion, strength, transferring, bowel/bladder control, bathing, dressing, feeding, toileting and psychosocial support 5. Can the patient actively participate in an intensive therapy program of at least 3 hrs of therapy 5 days a week? Yes 6. The potential for patient to make measurable gains while on inpatient rehab is good 7. Anticipated functional outcomes upon discharge from inpatients are: supervision PT, supervision OT, supervision SLP 8. Estimated rehab length of stay to reach the above functional goals is: 7 days 9. Anticipated D/C setting: Home 10. Anticipated post D/C treatments: [place] therapy 11. Overall Rehab/Functional Prognosis: excellent  MD Signature: Charlett Blake M.D. [facility] FAAPM&R (Sports Med, Neuromuscular Med) Diplomate Am Board of Electrodiagnostic Med

## 2018-12-18 NOTE — Progress Notes (Signed)
Received pt. As a new admission.Pt. has been oriented to the rehab unit.

## 2018-12-18 NOTE — Progress Notes (Signed)
Discharged pt to [place]. Report given to RN to receive. Pt going to  4M02. Pt is alert, oriented and stable. No Complaints of pain at this time. Will transfer Pt.

## 2018-12-18 NOTE — Progress Notes (Signed)
[name], David Salk, MD  Physician  Physical Medicine and Rehabilitation  PMR Pre-admission  Signed  Date of Service:  12/18/2018 3:16 PM      Related encounter: ED to Hosp-Admission (Current) from 12/16/2018 in [facility] [place]      Signed         Show:Clear all [x] Manual[x] Template[x] Copied  Added by: [x] [name], David Salk, MD[x] David [name], PT  [] Hover for details PMR Admission Coordinator Pre-Admission Assessment  Patient: David [name]. is an 78 y.o., male MRN: [id number] DOB: 1940/10/04 Height: 5' 9"  (175.3 cm) Weight: 92.8 kg  Insurance Information HMO:     PPO:      PCP:      IPA:      80/20:      OTHER:  PRIMARY: Medicare A and B      Policy#: [id number]      Subscriber: patient CM Name:       Phone#:      Fax#:  Pre-Cert#: verified Civil engineer, contracting:  Benefits:  Phone #:      Name:  Eff. Date: 06/08/2004     Deduct: $1408      Out of Pocket Max: n/a      Life Max: n/a CIR: 100%      SNF: 20 full days Outpatient:      Co-Pay:  Home Health:       Co-Pay:  DME:      Co-Pay:  Providers: pt choice SECONDARY: [facility]      Policy#: [id number]      Subscriber:  CM Name:       Phone#:      Fax#:  Pre-Cert#:       Employer:  Benefits:  Phone #:      Name:  Eff. Date:      Deduct:       Out of Pocket Max:       Life Max:  CIR:       SNF:  Outpatient:      Co-Pay:  Home Health:       Co-Pay:  DME:      Co-Pay:   Medicaid Application Date:       Case Manager:  Disability Application Date:       Case Worker:   The Data Collection Information Summary for patients in Inpatient Rehabilitation Facilities with attached Privacy Act [place] Records was provided and verbally reviewed with: Patient and Family  Emergency Contact Information         Contact Information    Name Relation Home Work [place] Daughter   [phone]   [name],David Spouse [phone]         Current Medical History  Patient Admitting Diagnosis: debility   History of Present Illness: David [name], [name] is a 78 year old right-handed male with history of nontraumatic intracerebral hemorrhage secondary to hypertensive crisis affecting the right side in 2017 receiving inpatient rehab services 08/26/2015 to 09/10/2015 and was discharged to skilled nursing facility. Patient also with history of prostate cancer,melanoma with brain metsdiagnosed 2017, status post left frontal craniotomy, tumor excision and radiation therapy to the brain and chemotherapycomplicated by leukopenia, thrombocytopenia followed byDr. Burr Medico andDr. Vaslow, hypothyroidism, hypertension, CAD with CABG, biventricular ICD. Patient recently hospitalized 12/08/2018 for right foot cellulitis, pancytopenia and did receive Granix on 12/10/2018. Patient was placed on IV antibiotics as well as p.o. steroids and discharged home on p.o. doxycycline for 3 days.Presented  12/16/2018 with polyuria, polydipsia, generalized weakness and body aches x3 days. Noted blood sugars in the 400s. COVID negative, urinalysis negative nitrite with urine culture no growth, CK 19, hemoglobin A1c 7.4, WBC 26,600, creatinine 2.23 with baseline 2.04 in late August however reported 09/23/2018 creatinine 1.56. Chest x-ray negative, recent renal ultrasound showed some increased cortical echogenicity and mild cortical thinning bilaterally bilateral complex and simple appearing renal cyst. Normal appearance of the urinary bladder. Patient was placed on gentle IV fluids. Placed on low-dose prednisone for suspected gout left foot with uric acid level within normal limits sedimentation rate mildly elevated 26. Leukocytosis felt to be related to recent Granix injection. Subcutaneous Lovenox for DVT prophylaxis. Renal function stabilizing 1.99 creatinine as well as WBC trending down to 20,000. Therapy evaluations completed patient was recommended for a  comprehensive rehab program.    Patient's medical record from [facility] has been reviewed by the rehabilitation admission coordinator and physician.  Past Medical History      Past Medical History:  Diagnosis Date   Anginal pain ([place])    Arthritis    mostly hands   Benign familial hematuria    BPH (benign prostatic hypertrophy)    Brain cancer (HCC)    melanoma with brain met   Coronary artery disease    GERD (gastroesophageal reflux disease)    High cholesterol    Hypertension    Hypothyroidism    Intracerebral hemorrhage (HCC)    Melanoma (HCC)    Metastatic melanoma to lung (HCC)    Mood disorder ([place])    Myocardial infarction [facility])    Pacemaker    due to syncope, 3rd degree HB (upgrade to [employer] Jude CRT-P 02/25/13 (Dr. Uvaldo Rising)   Rectal bleed    due to NSAIDS   Renal disorder    Sepsis ([place]) 12/09/2018   Skin cancer    melanoma   Sleep apnea    uses CPAP   Stroke ([place])     Family History   family history includes Cancer in his maternal grandmother; Cancer (age of onset: 68) in his mother; Heart attack in his father; Heart disease in his father; Hypertension in his father and mother; Rheum arthritis in his sister.  Prior Rehab/Hospitalizations Has the patient had prior rehab or hospitalizations prior to admission? Yes  Has the patient had major surgery during 100 days prior to admission? prostate biopsy      Current Medications  Current Facility-Administered Medications:    0.9 %  sodium chloride infusion, , Intravenous, Continuous, Pahwani, Rinka R, MD, Last Rate: 125 mL/hr at 12/18/18 1243   acetaminophen (TYLENOL) tablet 650 mg, 650 mg, Oral, Q6H PRN, 650 mg at 12/16/18 2241 **OR** acetaminophen (TYLENOL) suppository 650 mg, 650 mg, Rectal, Q6H PRN, Pahwani, Rinka R, MD   amLODipine (NORVASC) tablet 5 mg, 5 mg, Oral, Daily, Pahwani, Rinka R, MD, 5 mg at 12/18/18 0926   aspirin EC tablet 81 mg,  81 mg, Oral, QPM, Pahwani, Rinka R, MD, 81 mg at 12/17/18 1820   bisacodyl (DULCOLAX) suppository 10 mg, 10 mg, Rectal, Daily PRN, Samuella Cota, MD   citalopram (CELEXA) tablet 10 mg, 10 mg, Oral, Daily, Pahwani, Rinka R, MD, 10 mg at 12/18/18 0926   enoxaparin (LOVENOX) injection 40 mg, 40 mg, Subcutaneous, Q24H, Karren Cobble, RPH, 40 mg at 12/17/18 1820   glipiZIDE (GLUCOTROL) tablet 2.5 mg, 2.5 mg, Oral, QAC breakfast, Samuella Cota, MD, 2.5 mg at 12/18/18 0808   insulin aspart (novoLOG) injection  0-15 Units, 0-15 Units, Subcutaneous, TID WC, Pahwani, Rinka R, MD, 2 Units at 12/18/18 1240   insulin aspart (novoLOG) injection 0-5 Units, 0-5 Units, Subcutaneous, QHS, Pahwani, Rinka R, MD, 5 Units at 12/17/18 2140   levothyroxine (SYNTHROID) tablet 137 mcg, 137 mcg, Oral, Q0600, Pahwani, Rinka R, MD, 137 mcg at 12/18/18 6144   metoprolol succinate (TOPROL-XL) 24 hr tablet 75 mg, 75 mg, Oral, BID, Pahwani, Rinka R, MD, 75 mg at 12/18/18 0926   ondansetron (ZOFRAN) tablet 4 mg, 4 mg, Oral, Q6H PRN **OR** ondansetron (ZOFRAN) injection 4 mg, 4 mg, Intravenous, Q6H PRN, Pahwani, Rinka R, MD   polyethylene glycol (MIRALAX / GLYCOLAX) packet 17 g, 17 g, Oral, BID, Samuella Cota, MD, 17 g at 12/18/18 3154   predniSONE (DELTASONE) tablet 40 mg, 40 mg, Oral, Q breakfast, Pahwani, Rinka R, MD, 40 mg at 12/18/18 0086   rosuvastatin (CRESTOR) tablet 20 mg, 20 mg, Oral, QHS, Pahwani, Rinka R, MD, 20 mg at 12/17/18 2141   senna (SENOKOT) tablet 8.6 mg, 1 tablet, Oral, QHS, Samuella Cota, MD, 8.6 mg at 12/17/18 2141   traZODone (DESYREL) tablet 25 mg, 25 mg, Oral, QHS PRN, Pahwani, Rinka R, MD, 25 mg at 12/16/18 2241  Facility-Administered Medications Ordered in Other Encounters:    0.9 %  sodium chloride infusion, 1,000 mL, Intravenous, Once, [name], MD  Patients Current Diet:     Diet Order                  Diet - low sodium heart healthy          Diet regular Room service appropriate? Yes; Fluid consistency: Thin  Diet effective now               Precautions / Restrictions Precautions Precautions: Fall Restrictions Weight Bearing Restrictions: No   Has the patient had 2 or more falls or a fall with injury in the past year? No  Prior Activity Level Limited Community (1-2x/wk): indep without device, was still driving and going out for appointments  Prior Functional Level Self Care: Did the patient need help bathing, dressing, using the toilet or eating? Independent  Indoor Mobility: Did the patient need assistance with walking from room to room (with or without device)? Independent  Stairs: Did the patient need assistance with internal or external stairs (with or without device)? Independent  Functional Cognition: Did the patient need help planning regular tasks such as shopping or remembering to take medications? Needed some help  Home Assistive Devices / [place] Devices/Equipment: CPAP(uses CPAP at [facility]) Home Equipment: None  Prior Device Use: Indicate devices/aids used by the patient prior to current illness, exacerbation or injury? None of the above  Current Functional Level Cognition  Overall Cognitive Status: Impaired/Different from baseline Orientation Level: Oriented X4 Safety/Judgement: Decreased awareness of safety General Comments: Pt alert and oriented X3. Pt demonstrated slowed processing throughout.    Extremity Assessment (includes Sensation/Coordination)  Upper Extremity Assessment: Generalized weakness  Lower Extremity Assessment: Generalized weakness    ADLs       Mobility  Overal bed mobility: Needs Assistance Bed Mobility: Supine to Sit, Sit to Supine Supine to sit: Mod assist Sit to supine: Mod assist General bed mobility comments: supine>sit with mod assist for trunk elevation, heavy use of bedrails. Sit>supine with mod assist for LE management.      Transfers  Overall transfer level: Needs assistance Equipment used: Rolling walker (2 wheeled), None Transfers: Sit to/from Stand  Sit to Stand: Min assist General transfer comment: Pt unable to come up to standing without AD despite assist from therapist. With RW pt performed sit<>stand from EOB with min assist    Ambulation / Gait / Stairs / Wheelchair Mobility  Ambulation/Gait Ambulation/Gait assistance: Herbalist (Feet): 80 Feet Assistive device: Rolling walker (2 wheeled) Gait Pattern/deviations: Trunk flexed, Decreased step length - right, Decreased stance time - right General Gait Details: Pt used RW for ambulation this session, decreased step length and foot clearance noted R LE, cues for increased step length and upright posture Gait velocity: Decreased Gait velocity interpretation: <1.31 ft/sec, indicative of household ambulator    Posture / Balance Dynamic Sitting Balance Sitting balance - Comments: static sitting balance supervision, dynamic sitting balance min guard assist Balance Overall balance assessment: Needs assistance Sitting-balance support: No upper extremity supported, Feet supported Sitting balance-Leahy Scale: Fair Sitting balance - Comments: static sitting balance supervision, dynamic sitting balance min guard assist Standing balance support: Bilateral upper extremity supported, During functional activity Standing balance-Leahy Scale: Poor Standing balance comment: Reliant on BUE support using RW, delayed balance reactions    Special needs/care consideration BiPAP/CPAP cpap at night CPM no Continuous Drip IV no Dialysis no        Days n/a Life Vest no Oxygen no Special Bed no Trach Size no Wound Vac (area) no      Location n/a Skin cellulitis R foot                       Bowel mgmt: continent, last BM 9/5 Bladder mgmt: continent Diabetic mgmt: yes, newly diagnosed diabetic (this admission), will need diabetes  education Behavioral consideration no Chemo/radiation no   Previous Home Environment (from acute therapy documentation) Living Arrangements: Spouse/significant other Available Help at Discharge: Family, Available PRN/intermittently Type of Home: House Home Layout: One level Home Access: Stairs to enter Entrance Stairs-Rails: Right Entrance Stairs-Number of Steps: 2 Bathroom Shower/Tub: Multimedia programmer: Handicapped height Home Care Services: No Additional Comments: Pt's wife unable to physically assist. Report they live at [place]   Discharge Living Setting Plans for Discharge Living Setting: Other (Comment)(Indep Living Facility ([place])) Type of Home at Discharge: Independent living facility Discharge Home Layout: One level Discharge Home Access: Level entry Discharge Bathroom Shower/Tub: Walk-in shower Discharge Bathroom Toilet: Handicapped height Discharge Bathroom Accessibility: Yes How Accessible: Accessible via wheelchair Does the patient have any problems obtaining your medications?: No  Social/Family/Support Systems Patient Roles: Spouse Anticipated Caregiver: hired caregivers Anticipated Ambulance person Information: [name] (daughter) is contact: [phone] Caregiver Availability: 24/7 Discharge Plan Discussed with Primary Caregiver: Yes Is Caregiver In Agreement with Plan?: Yes Does Caregiver/Family have Issues with Lodging/Transportation while Pt is in Rehab?: No  Goals/Additional Needs Patient/Family Goal for Rehab: PT/OT supervision Expected length of stay: 5-7 days Dietary Needs: reg/thin Pt/Family Agrees to Admission and willing to participate: Yes Program Orientation Provided & Reviewed with Pt/Caregiver Including Roles  & Responsibilities: Yes  Decrease burden of Care through IP rehab admission: n/a  Possible need for SNF placement upon discharge: No. Plan for short stay on CIR to maximize independence, then return  back to ILF with wife and 24/7 hired caregivers.  Plan discussed with pt's daughter, Silas [place], on 9/9, and she is in agreement.   Patient Condition: I have reviewed medical records from [facility], spoken with CM, and patient and daughter. I met with patient at the bedside and discussed via phone  for inpatient rehabilitation assessment.  Patient will benefit from ongoing PT and OT, can actively participate in 3 hours of therapy a day 5 days of the week, and can make measurable gains during the admission.  Patient will also benefit from the coordinated team approach during an Inpatient Acute Rehabilitation admission.  The patient will receive intensive therapy as well as Rehabilitation physician, nursing, social worker, and care management interventions.  Due to safety, skin/wound care, disease management, medication administration, pain management and patient education the patient requires 24 hour a day rehabilitation nursing.  The patient is currently min to mod with mobility and basic ADLs.  Discharge setting and therapy post discharge at home with home health is anticipated.  Patient has agreed to participate in the Acute Inpatient Rehabilitation Program and will admit today.  Preadmission Screen Completed By:  David [name], PT, DPT 12/18/2018 3:16 PM ______________________________________________________________________   Discussed status with Dr. Letta Pate on 12/18/18  at 3:29 PM  and received approval for admission today.  Admission Coordinator:  David [name], PT, DPT, time 3:29 PM Sudie Grumbling 12/18/18    Assessment/Plan: Diagnosis: Debility secondary to acute exacerbation of chronic renal failure 1. Does the need for close, 24 hr/day Medical supervision in concert with the patient's rehab needs make it unreasonable for this patient to be served in a less intensive setting? Yes 2. Co-Morbidities requiring supervision/potential complications: New onset diabetes, history of frontal  hemorrhage and metastatic disease to brain 3. Due to bladder management, bowel management, safety, skin/wound care, disease management, medication administration, pain management and patient education, does the patient require 24 hr/day rehab nursing? Yes 4. Does the patient require coordinated care of a physician, rehab nurse, PT (1-2 hrs/day, 5 days/week), OT (1-2 hrs/day, 5 days/week) and SLP (0.5-1 hrs/day, 5 days/week) to address physical and functional deficits in the context of the above medical diagnosis(es)? Yes Addressing deficits in the following areas: balance, endurance, locomotion, strength, transferring, bowel/bladder control, bathing, dressing, feeding, toileting and psychosocial support 5. Can the patient actively participate in an intensive therapy program of at least 3 hrs of therapy 5 days a week? Yes 6. The potential for patient to make measurable gains while on inpatient rehab is good 7. Anticipated functional outcomes upon discharge from inpatients are: supervision PT, supervision OT, supervision SLP 8. Estimated rehab length of stay to reach the above functional goals is: 7 days 9. Anticipated D/C setting: Home 10. Anticipated post D/C treatments: [place] therapy 11. Overall Rehab/Functional Prognosis: excellent  MD Signature: Charlett Blake M.D. [facility] FAAPM&R (Sports Med, Neuromuscular Med) Diplomate Am Board of Electrodiagnostic Med         Revision History

## 2018-12-18 NOTE — Progress Notes (Signed)
Inpatient Rehabilitation Admissions Coordinator  Daughter requesting pt to be considered for inpt rehab admit. I spoke with Michiel Cowboy, [place]. I have asked for a rehab consult for full rehab assessment.  Danne Baxter, RN, MSN Rehab Admissions Coordinator [phone] 12/18/2018 11:27 AM

## 2018-12-18 NOTE — Progress Notes (Addendum)
Inpatient Rehab Admissions:  Inpatient Rehab Consult received.  I met with patient at the bedside for rehabilitation assessment and to discuss goals and expectations of an inpatient rehab admission.  He seems somewhat agitated and confused, saying that I will need to speak with his daughter because, "I can't remember anything." Left voicemail for [name] to discuss possibility of CIR and await return call.  Addendum: I was able to speak with pt's daughter.  She states her ideal plan would be CIR (I explained it would likely be a short LOS 5-7 days at most), and then home to ILF with his wife and 24/7 hired caregivers.  I will page Dr. Avon Gully to see if pt is ready to admit today.   Signed: Shann Medal, PT, DPT Admissions Coordinator [phone] 12/18/18  2:07 PM

## 2018-12-18 NOTE — Progress Notes (Signed)
Inpatient Rehab Admissions Coordinator:   I have approval from Dr. Avon Gully for pt to admit to CIR today.  I have let CSW, pt, and family know.   Shann Medal, PT, DPT Admissions Coordinator [phone] 12/18/18  3:32 PM

## 2018-12-19 ENCOUNTER — Inpatient Hospital Stay (HOSPITAL_COMMUNITY): Payer: Medicare Other | Admitting: Occupational Therapy

## 2018-12-19 ENCOUNTER — Inpatient Hospital Stay (HOSPITAL_COMMUNITY): Payer: Medicare Other | Admitting: Rehabilitation

## 2018-12-19 DIAGNOSIS — R5381 Other malaise: Principal | ICD-10-CM

## 2018-12-19 DIAGNOSIS — M109 Gout, unspecified: Secondary | ICD-10-CM | POA: Diagnosis present

## 2018-12-19 DIAGNOSIS — L0390 Cellulitis, unspecified: Secondary | ICD-10-CM | POA: Diagnosis not present

## 2018-12-19 DIAGNOSIS — M1039 Gout due to renal impairment, multiple sites: Secondary | ICD-10-CM | POA: Diagnosis present

## 2018-12-19 LAB — COMPREHENSIVE METABOLIC PANEL
ALT: 52 U/L — ABNORMAL HIGH (ref 0–44)
AST: 44 U/L — ABNORMAL HIGH (ref 15–41)
Albumin: 2.7 g/dL — ABNORMAL LOW (ref 3.5–5.0)
Alkaline Phosphatase: 49 U/L (ref 38–126)
Anion gap: 11 (ref 5–15)
BUN: 63 mg/dL — ABNORMAL HIGH (ref 8–23)
CO2: 20 mmol/L — ABNORMAL LOW (ref 22–32)
Calcium: 8.6 mg/dL — ABNORMAL LOW (ref 8.9–10.3)
Chloride: 104 mmol/L (ref 98–111)
Creatinine, Ser: 1.67 mg/dL — ABNORMAL HIGH (ref 0.61–1.24)
GFR calc Af Amer: 44 mL/min — ABNORMAL LOW (ref >60)
GFR calc non Af Amer: 38 mL/min — ABNORMAL LOW (ref >60)
Glucose, Bld: 123 mg/dL — ABNORMAL HIGH (ref 70–99)
Potassium: 4.4 mmol/L (ref 3.5–5.1)
Sodium: 135 mmol/L (ref 135–145)
Total Bilirubin: 0.5 mg/dL (ref 0.3–1.2)
Total Protein: 6.2 g/dL — ABNORMAL LOW (ref 6.5–8.1)

## 2018-12-19 LAB — CBC WITH DIFFERENTIAL/PLATELET
Abs Immature Granulocytes: 0.68 10*3/uL — ABNORMAL HIGH (ref 0.00–0.07)
Basophils Absolute: 0 10*3/uL (ref 0.0–0.1)
Basophils Relative: 0 %
Eosinophils Absolute: 0 10*3/uL (ref 0.0–0.5)
Eosinophils Relative: 0 %
HCT: 34 % — ABNORMAL LOW (ref 39.0–52.0)
Hemoglobin: 11.7 g/dL — ABNORMAL LOW (ref 13.0–17.0)
Immature Granulocytes: 11 %
Lymphocytes Relative: 7 %
Lymphs Abs: 0.5 10*3/uL — ABNORMAL LOW (ref 0.7–4.0)
MCH: 32 pg (ref 26.0–34.0)
MCHC: 34.4 g/dL (ref 30.0–36.0)
MCV: 92.9 fL (ref 80.0–100.0)
Monocytes Absolute: 0.8 10*3/uL (ref 0.1–1.0)
Monocytes Relative: 13 %
Neutro Abs: 4.2 10*3/uL (ref 1.7–7.7)
Neutrophils Relative %: 69 %
Platelets: 130 10*3/uL — ABNORMAL LOW (ref 150–400)
RBC: 3.66 MIL/uL — ABNORMAL LOW (ref 4.22–5.81)
RDW: 14.6 % (ref 11.5–15.5)
WBC: 6.2 10*3/uL (ref 4.0–10.5)
nRBC: 0.3 % — ABNORMAL HIGH (ref 0.0–0.2)

## 2018-12-19 LAB — GLUCOSE, CAPILLARY
Glucose-Capillary: 88 mg/dL (ref 70–99)
Glucose-Capillary: 310 mg/dL — ABNORMAL HIGH (ref 70–99)
Glucose-Capillary: 246 mg/dL — ABNORMAL HIGH (ref 70–99)
Glucose-Capillary: 333 mg/dL — ABNORMAL HIGH (ref 70–99)

## 2018-12-19 MED ORDER — HYDROCODONE-ACETAMINOPHEN 5-325 MG PO TABS
1.0000 | ORAL_TABLET | Freq: Four times a day (QID) | ORAL | Status: DC | PRN
  Administered 2018-12-19 – 2018-12-26 (×14): 1 via ORAL
  Filled 2018-12-19 (×15): qty 1

## 2018-12-19 NOTE — Progress Notes (Signed)
Patient information reviewed and entered into eRehab System by Becky Kamy Poinsett, PPS coordinator. Information including medical coding, function ability, and quality indicators will be reviewed and updated through discharge.   

## 2018-12-19 NOTE — Evaluation (Signed)
Physical Therapy Assessment and Plan  Patient Details  Name: David [name]. MRN: [id number] Date of Birth: [birth year]  PT Diagnosis: Abnormal posture, Abnormality of gait, Difficulty walking, Muscle weakness and Pain in B feet Rehab Potential: Good ELOS: 14-16 days   Today's Date: 12/19/2018 PT Individual Time: 1050-1200 PT Individual Time Calculation (min): 70 min    Problem List:  Patient Active Problem List   Diagnosis Date Noted  . Debility 12/18/2018  . AKI (acute kidney injury) ([place]) 12/17/2018  . DM type 2 (diabetes mellitus, type 2) ([place]) 12/17/2018  . Weakness generalized 12/16/2018  . Altered mental status 12/09/2018  . Sepsis ([place]) 12/09/2018  . Fever 12/09/2018  . Neutropenia with fever ([place]) 12/09/2018  . Thrombocytopenia ([place]) 12/09/2018  . Acute pain of left wrist 11/15/2018  . Insomnia 10/14/2018  . Heart block AV complete ([place]) 10/01/2018  . Presence of biventricular cardiac pacemaker 10/01/2018  . Hypogonadism in male 04/18/2018  . Dandruff in adult 03/27/2018  . Dyslipidemia 03/27/2018  . Acute pain of right shoulder 03/11/2018  . Elevated PSA 12/24/2017  . Prediabetes 12/24/2017  . Hx of CABG 07/17/2017  . Alcohol use 04/19/2017  . Cognitive impairment 02/25/2017  . History of implantation of joint prosthesis of elbow 02/12/2017  . Osteoarthritis of knee 02/12/2017  . Hypercalcemia 11/01/2016  . Nontraumatic cortical hemorrhage of left cerebral hemisphere ([place]) 06/27/2016  . Hematochezia 05/06/2016  . Advanced care planning/counseling discussion 05/03/2016  . Atherosclerosis of native coronary artery of native heart with stable angina pectoris ([place]) 04/24/2016  . Ischemic cardiomyopathy 04/24/2016  . Acquired hypothyroidism 11/24/2015  . Metastatic melanoma to lung ([place])   . MDD (major depressive disorder), recurrent episode, moderate ([place])   . Benign prostatic hyperplasia   . Metastasis to brain ([place]) 10/15/2015  . Malignant melanoma  ([place]) 09/22/2015  . Lung mass   . Chronic kidney disease, stage III (moderate) (HCC)   . Overweight with body mass index (BMI) 25.0-29.9 08/26/2015  . Incontinence 08/26/2015  . Hemorrhagic stroke ([place]) 08/26/2015  . Hemiplegia and hemiparesis following nontraumatic intracerebral hemorrhage affecting right dominant side ([place]) 08/26/2015  . Aphasia following nontraumatic intracerebral hemorrhage 08/26/2015  . Gait disturbance, post-stroke 08/26/2015  . Benign essential HTN   . OSA on CPAP   . Biventricular ICD (implantable cardioverter-defibrillator) in place   . ICH (intracerebral hemorrhage) (HCC) - L frontal due to HTN vs CAA 08/22/2015    Past Medical History:  Past Medical History:  Diagnosis Date  . Anginal pain ([place])   . Arthritis    mostly hands  . Benign familial hematuria   . BPH (benign prostatic hypertrophy)   . Brain cancer ([place])    melanoma with brain met  . Coronary artery disease   . GERD (gastroesophageal reflux disease)   . High cholesterol   . Hypertension   . Hypothyroidism   . Intracerebral hemorrhage ([place])   . Melanoma ([place])   . Metastatic melanoma to lung ([place])   . Mood disorder ([place])   . Myocardial infarction ([place])   . Pacemaker    due to syncope, 3rd degree HB (upgrade to [facility]. Jude CRT-P 02/25/13 (Dr. Uvaldo Rising)  . Rectal bleed    due to NSAIDS  . Renal disorder   . Sepsis ([place]) 12/09/2018  . Skin cancer    melanoma  . Sleep apnea    uses CPAP  . Stroke [facility]    Past Surgical History:  Past Surgical History:  Procedure Laterality Date  .  APPLICATION OF CRANIAL NAVIGATION N/A 10/15/2015   Procedure: APPLICATION OF CRANIAL NAVIGATION;  Surgeon: Erline Levine, MD;  Location: [place] [facility]UR[facility] ORS;  Service: Neurosurgery;  Laterality: N/A;  . CARDIAC CATHETERIZATION  2013  . CARDIAC SURGERY     bypass X 2  . COLONOSCOPY WITH PROPOFOL N/A 05/08/2016   diverticulosis, int hem, no f/u needed [name])  . CORONARY ARTERY BYPASS GRAFT  2013   LIMA-LAD, SVG-PDA  03/27/11 (Dr. Francee Gentile)  . CRANIOTOMY N/A 10/15/2015   Procedure: LEFT FRONTAL CRANIOTOMY TUMOR EXCISION with Curve;  Surgeon: Erline Levine, MD;  Location: [place] [facility]UR[facility] ORS;  Service: Neurosurgery;  Laterality: N/A;  CRANIOTOMY TUMOR EXCISION  . EYE SURGERY    . FOOT SURGERY  08/2010  . JOINT REPLACEMENT Left    partial knee  . KNEE ARTHROSCOPY  12/2008  . LYMPH NODE BIOPSY    . PACEMAKER INSERTION    . TONSILLECTOMY      Assessment & Plan Clinical Impression: Patient is a 78 y.o. year old male with recent admission to the hospital with history of nontraumatic intracerebral hemorrhage secondary to hypertensive crisis affecting the right side June 2017 receiving inpatient rehab services 08/26/2015 09/10/2015 and was discharged to skilled nursing facility [facility]. Patient also with history of prostate cancer,melanoma with brain metsdiagnosed 2017 status post left frontal craniotomy, tumor excision and radiation therapy to the brain and chemotherapycomplicated by leukopenia, thrombocytopenia followed byDr. Burr Medico andDr. Vaslow, hypothyroidism, hypertension, CAD with CABG, biventricular ICD. Patient recently hospitalized 12/08/2018 for right foot cellulitis, pancytopenia and did receive Granix on 12/10/2018. Patient was placed on IV antibiotics as well as p.o. steroids and discharged home on p.o. doxycycline for 3 days.Per chart review patient lives with wife [facility]. 1 level home. Wife reportedly with some Alzheimer's disease and cannot provide physical assistance. Daughter/Dr. Silas [place] is a local physician in the area.Family plans to hire 24-hour care as needed at time of discharge.Presented 12/16/2018 with polyuria, polydipsia, generalized weakness and body aches x3 days. Noted blood sugars in the 400s. COVID negative, urinalysis negative nitrite with urine culture no growth, CK 19, hemoglobin A1c 7.4, WBC 26,600, creatinine 2.23 with baseline 2.04 in late  August however reported 09/23/2018 creatinine 1.56. Chest x-ray negative, recent renal ultrasound showed some increased cortical echogenicity and mild cortical thinning bilaterally bilateral complex and simple appearing renal cyst. Normal appearance of the urinary bladder. Patient was placed on gentle IV fluids. Placed on low-dose prednisone for suspected gout left foot with uric acid level within normal limits sedimentation rate mildly elevated 26. Leukocytosis felt to be related to recent Granix injection. Subcutaneous Lovenox for DVT prophylaxis. Renal function stabilizing 1.99 creatinine as well as WBC trending down to 20,000. Therapy evaluations completed patient was admitted for a comprehensive rehab program. .  Patient transferred to CIR on 12/18/2018 .   Patient currently requires mod with mobility secondary to muscle weakness, decreased cardiorespiratoy endurance and decreased initiation, decreased awareness, decreased problem solving, decreased safety awareness, decreased memory and delayed processing.  Prior to hospitalization, patient was independent  with mobility and lived with Spouse in a Independent living facility home.  Home access is 1Stairs to enter.  Patient will benefit from skilled PT intervention to maximize safe functional mobility, minimize fall risk and decrease caregiver burden for planned discharge home with 24 hour supervision.  Anticipate patient will benefit from follow up [place] at discharge.  PT - End of Session Activity Tolerance: Tolerates < 10 min activity, no significant change in  vital signs Endurance Deficit: Yes Endurance Deficit Description: Pt needs several rest breaks following minimal activity during PT eval PT Assessment Rehab Potential (ACUTE/IP ONLY): Good PT Barriers to Discharge: Decreased caregiver support PT Barriers to Discharge Comments: Per notes, daughter is going to hire more care for ILF, needs 24/7 PT Patient demonstrates impairments in the  following area(s): Balance;Endurance;Motor;Pain;Safety PT Transfers Functional Problem(s): Bed Mobility;Bed to Chair;Car;Furniture PT Locomotion Functional Problem(s): Ambulation;Wheelchair Mobility;Stairs PT Plan PT Intensity: Minimum of 1-2 x/day ,45 to 90 minutes PT Frequency: 5 out of 7 days PT Duration Estimated Length of Stay: 14-16 days PT Treatment/Interventions: Ambulation/gait training;Balance/vestibular training;Cognitive remediation/compensation;Discharge planning;DME/adaptive equipment instruction;Functional mobility training;Neuromuscular re-education;Pain management;Patient/family education;Stair training;Therapeutic Activities;Therapeutic Exercise;UE/LE Strength taining/ROM;Wheelchair propulsion/positioning PT Transfers Anticipated Outcome(s): S level with with RW PT Locomotion Anticipated Outcome(s): S level for short distances with RW, w/c use for community distances PT Recommendation Recommendations for Other Services: Speech consult Follow Up Recommendations: Home health PT;24 hour supervision/assistance Patient destination: Assisted Living(ILF w/ 24/7 care) Equipment Recommended: Rolling walker with 5" wheels Equipment Details: w/c to be determined  Skilled Therapeutic Intervention Skilled PT evaluation performed as below.  Pt requires heavy mod A for sit<>stand and overall bed mobility and note marked need for cuing throughout to recall safe technique and sequencing for sit<>stand, transfers and gait with placement of RW, hand placement, and sequencing.  Also note that pt has increased pain in B feet that progressed as we continued to be in standing or with gait.  RN made aware and asked if she would relay message to daughter about getting tennis shoes to unit for pt so that this may provide more support.  Pt left in recliner with chair alarm set and all needs in reach.   PT Evaluation Precautions/Restrictions Precautions Precautions: Fall Restrictions Weight Bearing  Restrictions: No Other Position/Activity Restrictions: B foot pain from gout/cellulitis General Chart Reviewed: Yes Family/Caregiver Present: No Vital SignsTherapy Vitals Pulse Rate: 87 BP: 107/65 Patient Position (if appropriate): Sitting Pain Pain Assessment Pain Scale: Faces Pain Score: 8  Faces Pain Scale: Hurts even more(by end of session) Pain Type: Acute pain Pain Location: Foot Pain Orientation: Right;Left Pain Descriptors / Indicators: Aching Pain Frequency: Constant(started yesterday) Pain Onset: On-going Pain Intervention(s): Repositioned;RN made aware Home Living/Prior Functioning Home Living Available Help at Discharge: Family;Available 24 hours/day(per notes, daughter to hire more care) Type of Home: Independent living facility Home Access: Stairs to enter Technical brewer of Steps: 1 Entrance Stairs-Rails: (unsure) Home Layout: One level Bathroom Shower/Tub: Multimedia programmer: Handicapped height Bathroom Accessibility: Yes Additional Comments: Pt's wife unable to physically assist. Report they live at [place]   Lives With: Spouse Prior Function Level of Independence: Independent with gait  Able to Take Stairs?: Yes Driving: Yes(only some) Vocation: Retired Comments: retired Warden/ranger: Impaired Inattention/Neglect: Does not attend to right side of body(decreased, not full inattention) Praxis Praxis: Impaired Praxis Impairment Details: Motor planning  Cognition Overall Cognitive Status: No family/caregiver present to determine baseline cognitive functioning Arousal/Alertness: Awake/alert Orientation Level: Oriented to person;Oriented to place;Disoriented to time Attention: Sustained;Selective Sustained Attention: Appears intact Selective Attention: Impaired Selective Attention Impairment: Functional basic Memory: Impaired Memory Impairment: Decreased recall of new information;Decreased  short term memory Sensation Sensation Light Touch: Appears Intact Hot/Cold: Appears Intact Coordination Gross Motor Movements are Fluid and Coordinated: No Fine Motor Movements are Fluid and Coordinated: No Coordination and Movement Description: decreased coordination likely due to decreased strength. Motor  Motor Motor - Skilled Clinical Observations: Generalized  weakness  Mobility Bed Mobility Bed Mobility: Rolling Right;Rolling Left;Left Sidelying to Sit;Sit to Supine Rolling Right: Minimal Assistance - Patient > 75% Rolling Left: Minimal Assistance - Patient > 75% Left Sidelying to Sit: Moderate Assistance - Patient 50-74% Sit to Supine: Moderate Assistance - Patient 50-74% Transfers Transfers: Sit to Stand;Stand to Sit;Stand Pivot Transfers Sit to Stand: Moderate Assistance - Patient 50-74% Stand to Sit: Moderate Assistance - Patient 50-74% Stand Pivot Transfers: Moderate Assistance - Patient 50 - 74% Stand Pivot Transfer Details: Verbal cues for technique;Verbal cues for precautions/safety;Verbal cues for safe use of DME/AE;Manual facilitation for weight shifting;Verbal cues for sequencing;Tactile cues for initiation Stand Pivot Transfer Details (indicate cue type and reason): Pt needs constant cues for hand placement each time for sitting/standing.  Cues for scooting forward and moving LEs back under him slightly to improve standing. Transfer Programmer, multimedia): Manufacturing systems engineer Ambulation: Yes Gait Assistance: Minimal Assistance - Patient > 75% Gait Distance (Feet): 30 Feet Assistive device: Rolling walker Gait Assistance Details: Verbal cues for technique;Verbal cues for precautions/safety;Verbal cues for safe use of DME/AE;Verbal cues for gait pattern;Verbal cues for sequencing Gait Assistance Details: Needs continued cues for safety and keeping RW close to him during gait.  Also cues for upright posture.  Pt with very short stride length due to pain in B  feet that worsened with increased gait. Gait Gait: Yes Gait Pattern: Step-to pattern;Step-through pattern;Decreased stride length;Antalgic;Trunk flexed;Narrow base of support Gait velocity: decreased Stairs / Additional Locomotion Stairs: Yes Stairs Assistance: Moderate Assistance - Patient 50 - 74% Stair Management Technique: Two rails;Forwards;Step to pattern;Backwards Number of Stairs: 2 Height of Stairs: 6 Wheelchair Mobility Wheelchair Mobility: Yes Wheelchair Assistance: Minimal assistance - Patient >75% Wheelchair Propulsion: Both upper extremities Wheelchair Parts Management: Needs assistance Distance: 30  Trunk/Postural Assessment  Cervical Assessment Cervical Assessment: Exceptions to [facility] Cervical AROM Overall Cervical AROM Comments: Forward head Thoracic Assessment Thoracic Assessment: Exceptions to [facility] Thoracic AROM Overall Thoracic AROM Comments: Rounded shoulders, kyphotic posture Lumbar Assessment Lumbar Assessment: Exceptions to [facility] Lumbar AROM Overall Lumbar AROM Comments: increased posterior pelvic tilt Postural Control Postural Control: Deficits on evaluation Righting Reactions: decreased righting reaction in standing without support  Balance Balance Balance Assessed: Yes Static Sitting Balance Static Sitting - Level of Assistance: 5: Stand by assistance Dynamic Sitting Balance Dynamic Sitting - Level of Assistance: 4: Min assist;5: Stand by assistance Static Standing Balance Static Standing - Level of Assistance: 4: Min assist Dynamic Standing Balance Dynamic Standing - Level of Assistance: 3: Mod assist Extremity Assessment      RLE Assessment RLE Assessment: Exceptions to [facility] General Strength Comments: Pt with generalized weakness, grossly 3+ to 4/5 overall, weaker in hip motions LLE Assessment LLE Assessment: Exceptions to [facility] General Strength Comments: Generalized weakness, grossly 3+ to 4/5, weaker in hip motions    Refer to Care Plan  for Long Term Goals  Recommendations for other services: None   Discharge Criteria: Patient will be discharged from PT if patient refuses treatment 3 consecutive times without medical reason, if treatment goals not met, if there is a change in medical status, if patient makes no progress towards goals or if patient is discharged from hospital.  The above assessment, treatment plan, treatment alternatives and goals were discussed and mutually agreed upon: by patient  Denice Bors 12/19/2018, 12:34 PM

## 2018-12-19 NOTE — H&P (Signed)
Physical Medicine and Rehabilitation Admission H&P        Chief Complaint  Patient presents with   Weakness  : HPI: David [name]. David [name], David [name]. is a 78 year old right-handed male with history of nontraumatic intracerebral hemorrhage secondary to hypertensive crisis affecting the right side June 2017 receiving inpatient rehab services 08/26/2015 09/10/2015 and was discharged to skilled nursing facility [facility].  Patient also with history of prostate cancer, melanoma with brain mets diagnosed 2017 status post left frontal craniotomy, tumor excision and radiation therapy to the brain and chemotherapy complicated by leukopenia, thrombocytopenia followed by Dr. Burr Medico and Dr. Mickeal Skinner, hypothyroidism, hypertension, CAD with CABG, biventricular ICD.  Patient recently hospitalized 12/08/2018 for right foot cellulitis, pancytopenia and did receive Granix on 12/10/2018.  Patient was placed on IV antibiotics as well as p.o. steroids and discharged home on p.o. doxycycline for 3 days.  Per chart review [facility]  1 level home.  Wife reportedly with some Alzheimer's disease and cannot provide physical assistance.  Daughter/Dr. Silas [place] is a local physician in the area.  Family plans to hire 24-hour care as needed at time of discharge.  Presented 12/16/2018 with polyuria, polydipsia, generalized weakness and body aches x3 days.  Noted blood sugars in the 400s.  COVID negative, urinalysis negative nitrite with urine culture no growth, CK 19, hemoglobin A1c 7.4, WBC 26,600, creatinine 2.23 with baseline 2.04 in late August however reported 09/23/2018 creatinine 1.56.  Chest x-ray negative, recent renal ultrasound showed some increased cortical echogenicity and mild cortical thinning bilaterally bilateral complex and simple appearing renal cyst.  Normal appearance of the urinary bladder.  Patient was placed on gentle IV fluids.  Placed on low-dose prednisone for suspected  gout left foot with uric acid level within normal limits sedimentation rate mildly elevated 26.  Leukocytosis felt to be related to recent Granix injection.  Subcutaneous Lovenox for DVT prophylaxis.  Renal function stabilizing 1.99 creatinine as well as WBC trending down to 20,000.  Therapy evaluations completed patient was admitted for a comprehensive rehab program.  The pt defers to his daughter during the history "can't remember anything"   PTA has been quite active , driving, even occasional golf   Review of Systems  Constitutional: Positive for malaise/fatigue. Negative for chills and fever.  HENT: Negative for hearing loss.   Eyes: Negative for blurred vision and double vision.  Respiratory: Negative for cough and shortness of breath.   Cardiovascular: Positive for leg swelling. Negative for chest pain and palpitations.  Gastrointestinal: Positive for constipation. Negative for heartburn, nausea and vomiting.       GERD  Genitourinary: Positive for urgency. Negative for flank pain.       Polyuria, polydipsia  Musculoskeletal: Positive for joint pain and myalgias.  Skin: Negative for rash.  Neurological: Positive for weakness.  Psychiatric/Behavioral: Positive for depression. The patient has insomnia.   All other systems reviewed and are negative.       Past Medical History:  Diagnosis Date   Anginal pain ([place])     Arthritis      mostly hands   Benign familial hematuria     BPH (benign prostatic hypertrophy)     Brain cancer (HCC)      melanoma with brain met   Coronary artery disease     GERD (gastroesophageal reflux disease)     High cholesterol     Hypertension     Hypothyroidism     Intracerebral hemorrhage ([place])  Melanoma ([place])     Metastatic melanoma to lung ([place])     Mood disorder ([place])     Myocardial infarction [facility])     Pacemaker      due to syncope, 3rd degree HB (upgrade to [facility]. Jude CRT-P 02/25/13 (Dr. Uvaldo Rising)   Rectal bleed      due to  NSAIDS   Renal disorder     Sepsis ([place]) 12/09/2018   Skin cancer      melanoma   Sleep apnea      uses CPAP   Stroke [facility])          Past Surgical History:  Procedure Laterality Date   APPLICATION OF CRANIAL NAVIGATION N/A 10/15/2015    Procedure: APPLICATION OF CRANIAL NAVIGATION;  Surgeon: Erline Levine, MD;  Location: [facility];  Service: Neurosurgery;  Laterality: N/A;   CARDIAC CATHETERIZATION   2013   CARDIAC SURGERY        bypass X 2   COLONOSCOPY WITH PROPOFOL N/A 05/08/2016    diverticulosis, int hem, no f/u needed [name])   CORONARY ARTERY BYPASS GRAFT   2013    LIMA-LAD, SVG-PDA 03/27/11 (Dr. Francee Gentile)   [place] N/A 10/15/2015    Procedure: LEFT FRONTAL CRANIOTOMY TUMOR EXCISION with Curve;  Surgeon: Erline Levine, MD;  Location: [facility];  Service: Neurosurgery;  Laterality: N/A;  CRANIOTOMY TUMOR EXCISION   EYE SURGERY       FOOT SURGERY   08/2010   JOINT REPLACEMENT Left      partial knee   KNEE ARTHROSCOPY   12/2008   LYMPH NODE BIOPSY       PACEMAKER INSERTION       TONSILLECTOMY            Family History  Problem Relation Age of Onset   Cancer Mother 44        lung    Hypertension Mother     Hypertension Father     Heart attack Father     Heart disease Father     Rheum arthritis Sister     Cancer Maternal Grandmother     Social History:  reports that he quit smoking about 61 years ago. He has a 3.00 pack-year smoking history. He has never used smokeless tobacco. He reports current alcohol use of about 14.0 standard drinks of alcohol per week. He reports that he does not use drugs. Allergies:       Allergies  Allergen Reactions   Ezetimibe Other (See Comments)      Unknown reaction   Simvastatin Other (See Comments)      Reaction:  Gave pt a fever  Fever - temp of 103.   In 2003         Medications Prior to Admission  Medication Sig Dispense Refill   acetaminophen (TYLENOL) 500 MG tablet Take 500 mg by mouth every  evening.        acetaminophen (TYLENOL) 650 MG CR tablet Take 1,300 mg by mouth every 8 (eight) hours as needed for pain.       amLODipine (NORVASC) 5 MG tablet TAKE ONE TABLET BY MOUTH EVERY DAY (Patient taking differently: Take 5 mg by mouth daily. ) 32 tablet 11   aspirin EC 81 MG tablet Take 81 mg by mouth every evening.        citalopram (CELEXA) 10 MG tablet Take 1 tablet (10 mg total) by mouth daily. 90 tablet 1   levothyroxine (SYNTHROID, LEVOTHROID) 137 MCG tablet TAKE  1 TABLET BY MOUTH ONCE DAILY BEFORE BREAKFAST (Patient taking differently: Take 137 mcg by mouth daily. ) 30 tablet 11   methylphenidate (RITALIN) 10 MG tablet Take 1 tablet (10 mg total) by mouth daily. (Patient taking differently: Take 10 mg by mouth daily as needed (when needing to concentrate). ) 30 tablet 0   metoprolol succinate (TOPROL-XL) 50 MG 24 hr tablet TAKE 1 AND 1/2 TABLET (75 MG) BY MOUTH EVERY DAY WITH OR IMMEDIATELY FOLLOWING A MEAL *DO NOT CRUSH* (Patient taking differently: Take 75 mg by mouth 2 (two) times daily. Take with or immediately following a meal "do not crush") 50 tablet 11   pentoxifylline (TRENTAL) 400 MG CR tablet Take 400 mg by mouth every evening.       rosuvastatin (CRESTOR) 20 MG tablet TAKE ONE TABLET BY MOUTH AT BEDTIME (Patient taking differently: Take 20 mg by mouth every evening. ) 30 tablet 8   traZODone (DESYREL) 50 MG tablet Take 0.5-1 tablets (25-50 mg total) by mouth at bedtime. (Patient taking differently: Take 25 mg by mouth at bedtime as needed for sleep. ) 30 tablet 1   Turmeric 500 MG CAPS Take 1,000 mg by mouth 2 (two) times daily.        vitamin E (VITAMIN E) 400 UNIT capsule Take 1 capsule (400 Units total) by mouth 2 (two) times daily. (Patient taking differently: Take 800 Units by mouth every evening. ) 60 capsule 6   Cholecalciferol (VITAMIN D3) 1000 units CAPS Take 1 capsule (1,000 Units total) by mouth daily. (Patient not taking: Reported on 12/16/2018) 30  capsule     doxycycline (VIBRA-TABS) 100 MG tablet Take 100 mg by mouth 2 (two) times daily.         Drug Regimen Review Drug regimen was reviewed and remains appropriate with no significant issues identified   Home: Home Living Family/patient expects to be discharged to:: Assisted living Living Arrangements: Spouse/significant other Available Help at Discharge: Family, Available PRN/intermittently Type of Home: House Home Access: Stairs to enter [employer] of Steps: 2 Entrance Stairs-Rails: Right Home Layout: One level Bathroom Shower/Tub: Multimedia programmer: Handicapped height Home Equipment: None Additional Comments: Pt's wife unable to physically assist. Report [facility]    Functional History: Prior Function Level of Independence: Independent   Functional Status:  Mobility: Bed Mobility Overal bed mobility: Needs Assistance Bed Mobility: Supine to Sit, Sit to Supine Supine to sit: Mod assist Sit to supine: Mod assist General bed mobility comments: supine>sit with mod assist for trunk elevation, heavy use of bedrails. Sit>supine with mod assist for LE management. Transfers Overall transfer level: Needs assistance Equipment used: Rolling walker (2 wheeled), None Transfers: Sit to/from Stand Sit to Stand: Min assist General transfer comment: Pt unable to come up to standing without AD despite assist from therapist. With RW pt performed sit<>stand from EOB with min assist Ambulation/Gait Ambulation/Gait assistance: Min assist Gait Distance (Feet): 80 Feet Assistive device: Rolling walker (2 wheeled) Gait Pattern/deviations: Trunk flexed, Decreased step length - right, Decreased stance time - right General Gait Details: Pt used RW for ambulation this session, decreased step length and foot clearance noted R LE, cues for increased step length and upright posture Gait velocity: Decreased Gait velocity interpretation: <1.31 ft/sec,  indicative of household ambulator     ADL:     Cognition: Cognition Overall Cognitive Status: Impaired/Different from baseline Orientation Level: Oriented X4 Cognition Arousal/Alertness: Awake/alert Behavior During Therapy: WFL for tasks assessed/performed Overall  Cognitive Status: Impaired/Different from baseline Area of Impairment: Memory, Safety/judgement, Awareness Memory: Decreased short-term memory Safety/Judgement: Decreased awareness of safety Awareness: Emergent General Comments: Pt alert and oriented X3. Pt demonstrated slowed processing throughout.   Physical Exam: Blood pressure 129/81, pulse 76, temperature 97.6 F (36.4 C), temperature source Oral, resp. rate 18, height 5' 9"  (1.753 m), weight 92.8 kg, SpO2 97 %. Physical Exam  Neurological:  Patient is alert.  No acute distress.  Makes good eye contact and follows simple commands.  He provides his name age and date of birth.Limited medical historian   General: No acute distress Mood and affect are appropriate Heart: Regular rate and rhythm no rubs murmurs or extra sounds Lungs: Clear to auscultation, breathing unlabored, no rales or wheezes Abdomen: Positive bowel sounds, soft nontender to palpation, nondistended Extremities: No clubbing, cyanosis, or edema Skin: No evidence of breakdown, no evidence of rash Neurologic: Cranial nerves II through XII intact, motor strength is 5/5 in bilateral deltoid, bicep, tricep, grip, 4/5 Bilateral hip flexor, knee extensors, ankle dorsiflexor and plantar flexor Sensory exam normal sensation to light touch and proprioception  bilateral upper and lower extremities Cerebellar exam normal finger to nose to finger Musculoskeletal: Full range of motion in all 4 extremities. No joint swelling   Lab Results Last 48 Hours        Results for orders placed or performed during the hospital encounter of 12/16/18 (from the past 48 hour(s))  Urinalysis, Routine w reflex microscopic      Status: Abnormal    Collection Time: 12/16/18  3:30 PM  Result Value Ref Range    Color, Urine YELLOW YELLOW    APPearance HAZY (A) CLEAR    Specific Gravity, Urine 1.015 1.005 - 1.030    pH 5.0 5.0 - 8.0    Glucose, UA 150 (A) NEGATIVE mg/dL    Hgb urine dipstick LARGE (A) NEGATIVE    Bilirubin Urine NEGATIVE NEGATIVE    Ketones, ur NEGATIVE NEGATIVE mg/dL    Protein, ur 100 (A) NEGATIVE mg/dL    Nitrite NEGATIVE NEGATIVE    Leukocytes,Ua NEGATIVE NEGATIVE    RBC / HPF 11-20 0 - 5 RBC/hpf    WBC, UA 0-5 0 - 5 WBC/hpf    Bacteria, UA NONE SEEN NONE SEEN    Squamous Epithelial / LPF 0-5 0 - 5      Comment: Performed at [facility], [address] [place], [place] [address]  CBC with Differential/Platelet     Status: Abnormal    Collection Time: 12/16/18  3:40 PM  Result Value Ref Range    WBC 24.4 (H) 4.0 - 10.5 K/uL    RBC 3.36 (L) 4.22 - 5.81 MIL/uL    Hemoglobin 11.0 (L) 13.0 - 17.0 g/dL    HCT 32.5 (L) 39.0 - 52.0 %    MCV 96.7 80.0 - 100.0 fL    MCH 32.7 26.0 - 34.0 pg    MCHC 33.8 30.0 - 36.0 g/dL    RDW 15.5 11.5 - 15.5 %    Platelets 97 (L) 150 - 400 K/uL      Comment: REPEATED TO VERIFY PLATELET COUNT CONFIRMED BY SMEAR Immature Platelet Fraction may be clinically indicated, consider ordering this additional test [id number]      nRBC 0.3 (H) 0.0 - 0.2 %    Neutrophils Relative % 48 %    Lymphocytes Relative 46 %    Monocytes Relative 6 %    Eosinophils Relative 0 %  Basophils Relative 0 %    Band Neutrophils 0 %    Metamyelocytes Relative 0 %    Myelocytes 0 %    Promyelocytes Relative 0 %    Blasts 0 %    nRBC 0 0 /100 WBC    Other 0 %    Neutro Abs 11.7 (H) 1.7 - 7.7 K/uL    Lymphs Abs 11.2 (H) 0.7 - 4.0 K/uL    Monocytes Absolute 1.5 (H) 0.1 - 1.0 K/uL    Eosinophils Absolute 0.0 0.0 - 0.5 K/uL    Basophils Absolute 0.0 0.0 - 0.1 K/uL      Comment: Performed at [facility] [address], [place], [place] [address]  Comprehensive  metabolic panel     Status: Abnormal    Collection Time: 12/16/18  3:40 PM  Result Value Ref Range    Sodium 137 135 - 145 mmol/L    Potassium 4.6 3.5 - 5.1 mmol/L    Chloride 107 98 - 111 mmol/L    CO2 18 (L) 22 - 32 mmol/L    Glucose, Bld 225 (H) 70 - 99 mg/dL    BUN 75 (H) 8 - 23 mg/dL    Creatinine, Ser 2.23 (H) 0.61 - 1.24 mg/dL    Calcium 8.7 (L) 8.9 - 10.3 mg/dL    Total Protein 5.6 (L) 6.5 - 8.1 g/dL    Albumin 2.8 (L) 3.5 - 5.0 g/dL    AST 19 15 - 41 U/L    ALT 40 0 - 44 U/L    Alkaline Phosphatase 49 38 - 126 U/L    Total Bilirubin 0.6 0.3 - 1.2 mg/dL    GFR calc non Af Amer 27 (L) >60 mL/min    GFR calc Af Amer 31 (L) >60 mL/min    Anion gap 12 5 - 15      Comment: Performed at [facility], [place] [address] [place], [place] [address]  CK     Status: Abnormal    Collection Time: 12/16/18  3:40 PM  Result Value Ref Range    Total CK 19 (L) 49 - 397 U/L      Comment: Performed at [facility], [place] [address], [place], [place] [address]  Hemoglobin A1c     Status: Abnormal    Collection Time: 12/16/18  3:40 PM  Result Value Ref Range    Hgb A1c MFr Bld 7.4 (H) 4.8 - 5.6 %      Comment: (NOTE) Pre diabetes:          5.7%-6.4% Diabetes:              >6.4% Glycemic control for   <7.0% adults with diabetes      Mean Plasma Glucose 165.68 mg/dL      Comment: Performed at [facility] [address] [place], [place] [address]  Urine Culture     Status: None    Collection Time: 12/16/18  3:51 PM    Specimen: Urine, Clean Catch  Result Value Ref Range    Specimen Description URINE, CLEAN CATCH      Special Requests NONE      Culture          NO GROWTH Performed at [facility], [place] [address], [place], [place] [address]      Report Status 12/17/2018 FINAL    POCT I-Stat EG7     Status: Abnormal    Collection Time: 12/16/18  3:59 PM  Result Value Ref Range    pH, Ven 7.367 7.250 - 7.430    pCO2, Ven 31.2 (L) 44.0 - 60.0 mmHg    pO2, Ven  49.0 (H) 32.0 - 45.0 mmHg    Bicarbonate 17.9 (L) 20.0 - 28.0 mmol/L    TCO2 19 (L) 22 - 32 mmol/L    O2 Saturation 83.0 %    Acid-base deficit 6.0 (H) 0.0 - 2.0 mmol/L    Sodium 137 135 - 145 mmol/L    Potassium 4.5 3.5 - 5.1 mmol/L    Calcium, Ion 1.24 1.15 - 1.40 mmol/L    HCT 31.0 (L) 39.0 - 52.0 %    Hemoglobin 10.5 (L) 13.0 - 17.0 g/dL    Patient temperature HIDE      Sample type VENOUS    SARS CORONAVIRUS 2 (TAT 6-24 HRS) Nasopharyngeal Nasopharyngeal Swab     Status: None    Collection Time: 12/16/18  4:45 PM    Specimen: Nasopharyngeal Swab  Result Value Ref Range    SARS Coronavirus 2 NEGATIVE NEGATIVE      Comment: (NOTE) SARS-CoV-2 target nucleic acids are NOT DETECTED. The SARS-CoV-2 RNA is generally detectable in upper and lower respiratory specimens during the acute phase of infection. Negative results do not preclude SARS-CoV-2 infection, do not rule out co-infections with other pathogens, and should not be used as the sole basis for treatment or other patient management decisions. Negative results must be combined with clinical observations, patient history, and epidemiological information. The expected result is Negative. Fact Sheet for Patients: SugarRoll.be Fact Sheet for Healthcare Providers: https://www.woods-mathews.com/ This test is not yet approved or cleared by the [place] FDA and  has been authorized for detection and/or diagnosis of SARS-CoV-2 by FDA under an Emergency Use Authorization (EUA). This EUA will remain  in effect (meaning this test can be used) for the duration of the COVID-19 declaration under Section 56 4(b)(1) of the Act, 21 U.S.C. section 360bbb-3(b)(1), unless the authorization is terminated or revoked sooner. Performed at [facility], [place] [address] [place], [place] [address]    Culture, blood (routine x 2)     Status: None (Preliminary result)    Collection Time: 12/16/18   8:31 PM    Specimen: BLOOD  Result Value Ref Range    Specimen Description BLOOD LEFT ANTECUBITAL      Special Requests          BOTTLES DRAWN AEROBIC AND ANAEROBIC Blood Culture results may not be optimal due to an excessive volume of blood received in culture bottles    Culture          NO GROWTH 2 DAYS Performed at [facility] [address], [place], [place] [address]      Report Status PENDING    CBC     Status: Abnormal    Collection Time: 12/16/18  8:41 PM  Result Value Ref Range    WBC 26.6 (H) 4.0 - 10.5 K/uL    RBC 3.40 (L) 4.22 - 5.81 MIL/uL    Hemoglobin 10.9 (L) 13.0 - 17.0 g/dL    HCT 32.5 (L) 39.0 - 52.0 %    MCV 95.6 80.0 - 100.0 fL    MCH 32.1 26.0 - 34.0 pg    MCHC 33.5 30.0 - 36.0 g/dL    RDW 15.6 (H) 11.5 - 15.5 %    Platelets 101 (L) 150 - 400 K/uL      Comment: REPEATED  TO VERIFY Immature Platelet Fraction may be clinically indicated, consider ordering this additional test [id number] CONSISTENT WITH PREVIOUS RESULT      nRBC 0.2 0.0 - 0.2 %      Comment: Performed at [facility], [place] [address] [place], [place] [address]  Creatinine, serum     Status: Abnormal    Collection Time: 12/16/18  8:41 PM  Result Value Ref Range    Creatinine, Ser 2.02 (H) 0.61 - 1.24 mg/dL    GFR calc non Af Amer 30 (L) >60 mL/min    GFR calc Af Amer 35 (L) >60 mL/min      Comment: Performed at [facility] [address] [place], [place] [address]  Magnesium     Status: None    Collection Time: 12/16/18  8:41 PM  Result Value Ref Range    Magnesium 2.0 1.7 - 2.4 mg/dL      Comment: Performed at [facility], [place] [address] [place], [place] [address]  Phosphorus     Status: None    Collection Time: 12/16/18  8:41 PM  Result Value Ref Range    Phosphorus 4.0 2.5 - 4.6 mg/dL      Comment: Performed at [facility] [address], [place], [place] [address]  TSH     Status: Abnormal    Collection Time: 12/16/18  8:41 PM  Result Value  Ref Range    TSH 10.506 (H) 0.350 - 4.500 uIU/mL      Comment: Performed by a 3rd Generation assay with a functional sensitivity of <=0.01 uIU/mL. Performed at [facility], [place] [address] [place], [place] [address]    C-reactive protein     Status: Abnormal    Collection Time: 12/16/18  8:41 PM  Result Value Ref Range    CRP 16.3 (H) <1.0 mg/dL      Comment: Performed at [facility], [place] [address], [place], [place] [address]  Sedimentation rate     Status: Abnormal    Collection Time: 12/16/18  8:41 PM  Result Value Ref Range    Sed Rate 22 (H) 0 - 16 mm/hr      Comment: Performed at [name] [address] [place], [place] [address]  Lactic acid, plasma     Status: None    Collection Time: 12/16/18  8:41 PM  Result Value Ref Range    Lactic Acid, Venous 1.0 0.5 - 1.9 mmol/L      Comment: Performed at [facility] [address], [place], [place] [address]  Culture, blood (routine x 2)     Status: None (Preliminary result)    Collection Time: 12/16/18  8:41 PM    Specimen: BLOOD LEFT HAND  Result Value Ref Range    Specimen Description BLOOD LEFT HAND      Special Requests          BOTTLES DRAWN AEROBIC AND ANAEROBIC Blood Culture adequate volume    Culture          NO GROWTH 2 DAYS Performed at [facility], [place] [address] [place], [place] [address]      Report Status PENDING    Troponin I (High Sensitivity)     Status: Abnormal    Collection Time: 12/16/18  8:41 PM  Result Value Ref Range    Troponin I (High Sensitivity) 28 (H) <18 ng/L      Comment: (NOTE)  Elevated high sensitivity troponin I (hsTnI) values and significant  changes across serial measurements may suggest ACS but many other  chronic and acute conditions are known to elevate hsTnI results.  Refer to the "Links" section for chest pain algorithms and additional  guidance. Performed at [facility], [place] [address], [place], [place] [address]    Glucose,  capillary     Status: Abnormal    Collection Time: 12/16/18 10:18 PM  Result Value Ref Range    Glucose-Capillary 184 (H) 70 - 99 mg/dL  CBC     Status: Abnormal    Collection Time: 12/17/18  2:23 AM  Result Value Ref Range    WBC 20.0 (H) 4.0 - 10.5 K/uL    RBC 3.37 (L) 4.22 - 5.81 MIL/uL    Hemoglobin 10.8 (L) 13.0 - 17.0 g/dL    HCT 32.4 (L) 39.0 - 52.0 %    MCV 96.1 80.0 - 100.0 fL    MCH 32.0 26.0 - 34.0 pg    MCHC 33.3 30.0 - 36.0 g/dL    RDW 15.5 11.5 - 15.5 %    Platelets 103 (L) 150 - 400 K/uL      Comment: REPEATED TO VERIFY Immature Platelet Fraction may be clinically indicated, consider ordering this additional test [id number] CONSISTENT WITH PREVIOUS RESULT      nRBC 0.1 0.0 - 0.2 %      Comment: Performed at [facility], [place] [address], [place], [place] [address]  Comprehensive metabolic panel     Status: Abnormal    Collection Time: 12/17/18  2:23 AM  Result Value Ref Range    Sodium 138 135 - 145 mmol/L    Potassium 5.5 (H) 3.5 - 5.1 mmol/L      Comment: DELTA CHECK NOTED    Chloride 112 (H) 98 - 111 mmol/L    CO2 16 (L) 22 - 32 mmol/L    Glucose, Bld 234 (H) 70 - 99 mg/dL    BUN 67 (H) 8 - 23 mg/dL    Creatinine, Ser 2.13 (H) 0.61 - 1.24 mg/dL    Calcium 8.6 (L) 8.9 - 10.3 mg/dL    Total Protein 5.5 (L) 6.5 - 8.1 g/dL    Albumin 2.7 (L) 3.5 - 5.0 g/dL    AST 22 15 - 41 U/L    ALT 39 0 - 44 U/L    Alkaline Phosphatase 46 38 - 126 U/L    Total Bilirubin 0.6 0.3 - 1.2 mg/dL    GFR calc non Af Amer 29 (L) >60 mL/min    GFR calc Af Amer 33 (L) >60 mL/min    Anion gap 10 5 - 15      Comment: Performed at [facility], [place] [address], [place], [place] [address]  Glucose, capillary     Status: Abnormal    Collection Time: 12/17/18  8:24 AM  Result Value Ref Range    Glucose-Capillary 179 (H) 70 - 99 mg/dL  Glucose, capillary     Status: Abnormal    Collection Time: 12/17/18 12:18 PM  Result Value Ref Range    Glucose-Capillary 197 (H) 70 - 99  mg/dL  Basic metabolic panel     Status: Abnormal    Collection Time: 12/17/18  3:24 PM  Result Value Ref Range    Sodium 135 135 - 145 mmol/L    Potassium 5.1 3.5 - 5.1 mmol/L    Chloride 107 98 - 111 mmol/L  CO2 17 (L) 22 - 32 mmol/L    Glucose, Bld 287 (H) 70 - 99 mg/dL    BUN 70 (H) 8 - 23 mg/dL    Creatinine, Ser 1.99 (H) 0.61 - 1.24 mg/dL    Calcium 8.0 (L) 8.9 - 10.3 mg/dL    GFR calc non Af Amer 31 (L) >60 mL/min    GFR calc Af Amer 36 (L) >60 mL/min    Anion gap 11 5 - 15      Comment: Performed at [facility] [address] [place], [place] [address]  Glucose, capillary     Status: Abnormal    Collection Time: 12/17/18  5:41 PM  Result Value Ref Range    Glucose-Capillary 284 (H) 70 - 99 mg/dL  Glucose, capillary     Status: Abnormal    Collection Time: 12/17/18  8:54 PM  Result Value Ref Range    Glucose-Capillary 367 (H) 70 - 99 mg/dL  Glucose, capillary     Status: Abnormal    Collection Time: 12/18/18  8:19 AM  Result Value Ref Range    Glucose-Capillary 116 (H) 70 - 99 mg/dL  Glucose, capillary     Status: Abnormal    Collection Time: 12/18/18 12:03 PM  Result Value Ref Range    Glucose-Capillary 133 (H) 70 - 99 mg/dL      Imaging Results (Last 48 hours)  Dg Chest Port 1 View   Result Date: 12/16/2018 CLINICAL DATA:  Brought in from home by EMS for weakness. Pt was discharged several days ago for sepsis d/t cellulitis in right foot. Daughter states he has gotten increasingly weak and yesterday started voiding large amounts. EXAM: PORTABLE CHEST 1 VIEW COMPARISON:  12/08/2018 FINDINGS: LEFT-sided transvenous pacemaker leads overlie the RIGHT atrium, coronary sinus, and RIGHT ventricle. Median sternotomy and CABG. The heart is normal in size. There are no focal consolidations or pleural effusions. Subsegmental atelectasis identified at the LEFT lung base. There is no pulmonary edema. IMPRESSION: No active disease. Electronically Signed   By: Nolon Nations M.D.   On: 12/16/2018 15:16    Dg Foot 2 Views Left   Result Date: 12/16/2018 CLINICAL DATA:  Pain and swelling. EXAM: LEFT FOOT - 2 VIEW COMPARISON:  Left ankle radiograph Aug 27, 2015 FINDINGS: There is no evidence of fracture or dislocation. Arthritic changes at the first metatarsophalangeal joint with joint space narrowing, subchondral sclerosis, erosive changes and cyst formation. Milder arthritic changes with subchondral erosions of the first interphalangeal joint. Soft tissues are unremarkable. IMPRESSION: 1. No acute fracture or dislocation identified about the left foot. 2. Arthropathy at the first metatarsophalangeal and interphalangeal joints may represent inflammatory or osteoarthritis. Electronically Signed   By: Fidela Salisbury M.D.   On: 12/16/2018 18:29            Medical Problem List and Plan: 1.  Debility secondary to generalized weakness related to recent cellulitis with gout flareup as well as history of malignant melanoma with metastasis to the brain 2.  Antithrombotics: -DVT/anticoagulation: Lovenox             -antiplatelet therapy: Aspirin 81 mg daily 3. Pain Management: Tylenol as needed 4. Mood: Celexa 10 mg daily.  Provide emotional support             -antipsychotic agents: N/A 5. Neuropsych: This patient is capable of making decisions on his own behalf. 6. Skin/Wound Care: Routine skin checks 7. Fluids/Electrolytes/Nutrition: Routine in and outs with follow-up chemistries  8.  Left foot pain and swelling related to acute gout.  Complete prednisone taper 9.  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  Status post craniotomy 2017 with chemotherapy as well as radiation.  Followed by Dr. Burr Medico as well as Dr. Mickeal Skinner 10.  Leukocytosis as well as history of pancytopenia.  Likely secondary to recent Granix injection.  Urinalysis study no growth 11.   Diabetes mellitus.  Hemoglobin A1c 7.4.  Low-dose Glucotrol 2.5 mg daily.  Check blood sugars before meals and at bedtime 12.  Hypertension.  Norvasc 5 mg daily, Toprol 75 mg twice daily.  Monitor with increased mobility 13.  AKI superimposed on CKD stage III.  Creatinine stabilizing after gentle IV fluids.  Follow-up chemistries 14.  CAD with CABG.  Continue aspirin therapy no chest pain 15.  Hypothyroidism.  Synthroid 15.  Hyperlipidemia.  Crestor  "I have personally performed a face to face diagnostic evaluation of this patient.  Additionally, I have reviewed and concur with the physician assistant's documentation above."  Charlett Blake M.D. [facility] FAAPM&R (Sports Med, Neuromuscular Med) Diplomate [place] Board of Electrodiagnostic Med  Cathlyn Parsons, PA-C 12/18/2018

## 2018-12-19 NOTE — Progress Notes (Signed)
PHYSICAL MEDICINE & REHABILITATION PROGRESS NOTE   Subjective/Complaints: Pt was very clear this Am that "has a toothache" on R lower jaw- he didn't tell me which tooth exactly, however was emphatic something was wrong and needed to get to a dentist- initially wasn't clear was in hospital, so explained not able to get to dentist, however, can treat pain prn.  He agreed.  Spoke with daughter- pt was dentist, so we both thought unusual pt not able to name which tooth exactly. Has had shingles on face/R chin and near eye in past- didn't take pain meds in past for it.   Objective:   No results found. Recent Labs    12/17/18 0223 12/19/18 0521  WBC 20.0* 6.2  HGB 10.8* 11.7*  HCT 32.4* 34.0*  PLT 103* 130*   Recent Labs    12/17/18 1524 12/19/18 0521  NA 135 135  K 5.1 4.4  CL 107 104  CO2 17* 20*  GLUCOSE 287* 123*  BUN 70* 63*  CREATININE 1.99* 1.67*  CALCIUM 8.0* 8.6*    Intake/Output Summary (Last 24 hours) at 12/19/2018 1454 Last data filed at 12/19/2018 0900 Gross per 24 hour  Intake 240 ml  Output 2500 ml  Net -2260 ml     Physical Exam: Vital Signs Blood pressure 112/72, pulse 80, temperature 98.6 F (37 C), temperature source Oral, resp. rate 20, height 5\' 9"  (1.753 m), weight 92.2 kg, SpO2 96 %.  Neurological:  Reviewed vitals, nursing notes and labs. Pt awake, alert, appropriate, Ox2- not clear in hospital, clear on issues currently, pointing to R lower jaw as source of pain, NAD Mood and affect are appropriate Heart: Regular rate and rhythm  Lungs: Clear to auscultation B/L Abdomen: Positive bowel sounds, soft, NT, ND Extremities: No clubbing, cyanosis, or edema Skin: No evidence of breakdown, no evidence of rash Neurologic: Cranial nerves II through XII intact, motor strength is 5/5 in bilateral deltoid, bicep, tricep, grip, 4/5 Bilateral hip flexor, knee extensors, ankle dorsiflexor and plantar flexor Sensory exam normal sensation to  light touch and proprioception  bilateral upper and lower extremities Cerebellar exam normal finger to nose to finger Musculoskeletal: Full range of motion in all 4 extremities. No joint swelling No skin breakdown, rash on face/no swelling on R lower jaw or that could see inside mouth that would signal a tooth needs attention.   Assessment/Plan: 1. Functional deficits secondary to Debility with recent cellulitis and gout flare which require 3+ hours per day of interdisciplinary therapy in a comprehensive inpatient rehab setting.  Physiatrist is providing close team supervision and 24 hour management of active medical problems listed below.  Physiatrist and rehab team continue to assess barriers to discharge/monitor patient progress toward functional and medical goals  Care Tool:  Bathing    Body parts bathed by patient: Right arm, Left arm, Chest, Abdomen, Front perineal area, Right upper leg, Left upper leg, Face   Body parts bathed by helper: Buttocks, Right lower leg, Left lower leg     Bathing assist Assist Level: Moderate Assistance - Patient 50 - 74%     Upper Body Dressing/Undressing Upper body dressing   What is the patient wearing?: Pull over shirt, Button up shirt    Upper body assist Assist Level: Moderate Assistance - Patient 50 - 74%    Lower Body Dressing/Undressing Lower body dressing      What is the patient wearing?: Underwear/pull up, Pants     Lower body assist Assist for  lower body dressing: Total Assistance - Patient < 25%     Toileting Toileting    Toileting assist Assist for toileting: Maximal Assistance - Patient 25 - 49%     Transfers Chair/bed transfer  Transfers assist     Chair/bed transfer assist level: Moderate Assistance - Patient 50 - 74%     Locomotion Ambulation   Ambulation assist      Assist level: Minimal Assistance - Patient > 75% Assistive device: Walker-rolling Max distance: 30   Walk 10 feet  activity   Assist     Assist level: Minimal Assistance - Patient > 75% Assistive device: Walker-rolling   Walk 50 feet activity   Assist Walk 50 feet with 2 turns activity did not occur: Safety/medical concerns         Walk 150 feet activity   Assist Walk 150 feet activity did not occur: Safety/medical concerns         Walk 10 feet on uneven surface  activity   Assist Walk 10 feet on uneven surfaces activity did not occur: Safety/medical concerns         Wheelchair     Assist Will patient use wheelchair at discharge?: Yes(for community use) Type of Wheelchair: Manual    Wheelchair assist level: Minimal Assistance - Patient > 75% Max wheelchair distance: 30    Wheelchair 50 feet with 2 turns activity    Assist    Wheelchair 50 feet with 2 turns activity did not occur: Safety/medical concerns       Wheelchair 150 feet activity     Assist  Wheelchair 150 feet activity did not occur: Safety/medical concerns       Blood pressure 112/72, pulse 80, temperature 98.6 F (37 C), temperature source Oral, resp. rate 20, height 5\' 9"  (1.753 m), weight 92.2 kg, SpO2 96 %.  Medical Problem List and Plan: 1.Debilitysecondary to generalized weakness related to recent cellulitis with gout flareup as well as history of malignant melanoma with metastasis to the brain 2. Antithrombotics: -DVT/anticoagulation:Lovenox -antiplatelet therapy: Aspirin 81 mg daily 3. Pain Management:Tylenol as needed 4. Mood:Celexa 10 mg daily. Provide emotional support -antipsychotic agents: N/A 5. Neuropsych: This patientis ? capable of making decisions on hisown behalf. 6. Skin/Wound Care:Routine skin checks 7. Fluids/Electrolytes/Nutrition:Routine in and outs with follow-up chemistries 8. Left foot pain and swelling related to acute gout. Complete prednisone taper 9. Stage IV malignant melanoma metastatic disease to the brain and  lung as well as history of nontraumatic intracerebral hemorrhage with right hemiparesis. Status post craniotomy 2017 with chemotherapy as well as radiation. Followed by Dr. Burr Medico as well as Dr. Mickeal Skinner 10. Leukocytosis as well as history of pancytopenia. Likely secondary to recent Granix injection. Urinalysis study no growth  9/10- WBC down to 6.2k- will recheck weekly 11. Diabetes mellitus. Hemoglobin A1c 7.4. Low-dose Glucotrol 2.5 mg daily. Check blood sugars before meals and at bedtime 12. Hypertension. Norvasc 5 mg daily, Toprol 75 mg twice daily. Monitor with increased mobility 13. AKI superimposed on CKD stage III. Creatinine stabilizing after gentle IV fluids. Follow-up chemistries  9/10- Cr down to 1.67 and Bun down to 63 from 70 14. CAD with CABG. Continue aspirin therapy no chest pain 15. Hypothyroidism. Synthroid 16. Hyperlipidemia. Crestor  17. Toothache  9/10- pt was emphatic tylenol not helpful, nor tramadol (in past)- wants something stronger- spoke to pt's daughter, MD here at [place] with [facility] prn- ordered  LOS: 1 days A FACE TO [place]  David [name] 12/19/2018, 2:54 PM

## 2018-12-19 NOTE — Evaluation (Signed)
Occupational Therapy Assessment and Plan  Patient Details  Name: David [name]. MRN: [id number] Date of Birth: [birth year]  OT Diagnosis: abnormal posture, acute pain, apraxia, cognitive deficits and muscle weakness (generalized) Rehab Potential: Rehab Potential (ACUTE ONLY): Good ELOS: 14 days   Today's Date: 12/19/2018 OT Individual Time: 8841-6606 OT Individual Time Calculation (min): 75 min     Problem List:  Patient Active Problem List   Diagnosis Date Noted  . Debility 12/18/2018  . AKI (acute kidney injury) ([place]) 12/17/2018  . DM type 2 (diabetes mellitus, type 2) ([place]) 12/17/2018  . Weakness generalized 12/16/2018  . Altered mental status 12/09/2018  . Sepsis ([place]) 12/09/2018  . Fever 12/09/2018  . Neutropenia with fever ([place]) 12/09/2018  . Thrombocytopenia ([place]) 12/09/2018  . Acute pain of left wrist 11/15/2018  . Insomnia 10/14/2018  . Heart block AV complete ([place]) 10/01/2018  . Presence of biventricular cardiac pacemaker 10/01/2018  . Hypogonadism in male 04/18/2018  . Dandruff in adult 03/27/2018  . Dyslipidemia 03/27/2018  . Acute pain of right shoulder 03/11/2018  . Elevated PSA 12/24/2017  . Prediabetes 12/24/2017  . Hx of CABG 07/17/2017  . Alcohol use 04/19/2017  . Cognitive impairment 02/25/2017  . History of implantation of joint prosthesis of elbow 02/12/2017  . Osteoarthritis of knee 02/12/2017  . Hypercalcemia 11/01/2016  . Nontraumatic cortical hemorrhage of left cerebral hemisphere ([place]) 06/27/2016  . Hematochezia 05/06/2016  . Advanced care planning/counseling discussion 05/03/2016  . Atherosclerosis of native coronary artery of native heart with stable angina pectoris ([place]) 04/24/2016  . Ischemic cardiomyopathy 04/24/2016  . Acquired hypothyroidism 11/24/2015  . Metastatic melanoma to lung ([place])   . MDD (major depressive disorder), recurrent episode, moderate ([place])   . Benign prostatic hyperplasia   . Metastasis to brain ([place])  10/15/2015  . Malignant melanoma ([place]) 09/22/2015  . Lung mass   . Chronic kidney disease, stage III (moderate) (H[facility])   . Overweight with body mass index (BMI) 25.0-29.9 08/26/2015  . Incontinence 08/26/2015  . Hemorrhagic stroke ([facility]) 08/26/2015  . Hemiplegia and hemiparesis following nontraumatic intracerebral hemorrhage affecting right dominant side ([place]) 08/26/2015  . Aphasia following nontraumatic intracerebral hemorrhage 08/26/2015  . Gait disturbance, post-stroke 08/26/2015  . Benign essential HTN   . OSA on CPAP   . Biventricular ICD (implantable cardioverter-defibrillator) in place   . ICH (intracerebral hemorrhage) (HCC) - L frontal due to HTN vs CAA 08/22/2015    Past Medical History:  Past Medical History:  Diagnosis Date  . Anginal pain ([place])   . Arthritis    mostly hands  . Benign familial hematuria   . BPH (benign prostatic hypertrophy)   . Brain cancer ([place])    melanoma with brain met  . Coronary artery disease   . GERD (gastroesophageal reflux disease)   . High cholesterol   . Hypertension   . Hypothyroidism   . Intracerebral hemorrhage ([place])   . Melanoma ([place])   . Metastatic melanoma to lung ([place])   . Mood disorder ([place])   . Myocardial infarction ([place])   . Pacemaker    due to syncope, 3rd degree HB (upgrade to [facility]. Jude CRT-P 02/25/13 (Dr. Uvaldo Rising)  . Rectal bleed    due to NSAIDS  . Renal disorder   . Sepsis ([place]) 12/09/2018  . Skin cancer    melanoma  . Sleep apnea    uses CPAP  . Stroke [facility])    Past Surgical History:  Past Surgical History:  Procedure Laterality Date  .  APPLICATION OF CRANIAL NAVIGATION N/A 10/15/2015   Procedure: APPLICATION OF CRANIAL NAVIGATION;  Surgeon: Erline Levine, MD;  Location: [facility]UR[facility];  Service: Neurosurgery;  Laterality: N/A;  . CARDIAC CATHETERIZATION  2013  . CARDIAC SURGERY     bypass X 2  . COLONOSCOPY WITH PROPOFOL N/A 05/08/2016   diverticulosis, int hem, no f/u needed [name])  . CORONARY ARTERY BYPASS  GRAFT  2013   LIMA-LAD, SVG-PDA 03/27/11 (Dr. Francee Gentile)  . CRANIOTOMY N/A 10/15/2015   Procedure: LEFT FRONTAL CRANIOTOMY TUMOR EXCISION with Curve;  Surgeon: Erline Levine, MD;  Location: [facility] NEURO ORS;  Service: Neurosurgery;  Laterality: N/A;  CRANIOTOMY TUMOR EXCISION  . EYE SURGERY    . FOOT SURGERY  08/2010  . JOINT REPLACEMENT Left    partial knee  . KNEE ARTHROSCOPY  12/2008  . LYMPH NODE BIOPSY    . PACEMAKER INSERTION    . TONSILLECTOMY      Assessment & Plan Clinical Impression: Neeraj Housand. [place], [place]. is a 78 year old right-handed male with history of nontraumatic intracerebral hemorrhage secondary to hypertensive crisis affecting the right side June 2017 receiving inpatient rehab services 08/26/2015 09/10/2015 and was discharged to skilled nursing facility [facility].  Patient also with history of prostate cancer, melanoma with brain mets diagnosed 2017 status post left frontal craniotomy, tumor excision and radiation therapy to the brain and chemotherapy complicated by leukopenia, thrombocytopenia followed by Dr. Burr Medico and Dr. Mickeal Skinner, hypothyroidism, hypertension, CAD with CABG, biventricular ICD.  Patient recently hospitalized 12/08/2018 for right foot cellulitis, pancytopenia and did receive Granix on 12/10/2018.  Patient was placed on IV antibiotics as well as p.o. steroids and discharged home on p.o. doxycycline for 3 days.  Per chart review patient lives with wife [facility].  1 level home.  Wife reportedly with some Alzheimer's disease and cannot provide physical assistance.  Daughter/Dr. Silas [place] is a local physician in the area.  Family plans to hire 24-hour care as needed at time of discharge.  Presented 12/16/2018 with polyuria, polydipsia, generalized weakness and body aches x3 days.  Noted blood sugars in the 400s.  COVID negative, urinalysis negative nitrite with urine culture no growth, CK 19, hemoglobin A1c 7.4, WBC 26,600, creatinine 2.23 with  baseline 2.04 in late August however reported 09/23/2018 creatinine 1.56.  Chest x-ray negative, recent renal ultrasound showed some increased cortical echogenicity and mild cortical thinning bilaterally bilateral complex and simple appearing renal cyst.  Normal appearance of the urinary bladder.  Patient was placed on gentle IV fluids.  Placed on low-dose prednisone for suspected gout left foot with uric acid level within normal limits sedimentation rate mildly elevated 26.  Leukocytosis felt to be related to recent Granix injection.  Subcutaneous Lovenox for DVT prophylaxis.  Renal function stabilizing 1.99 creatinine as well as WBC trending down to 20,000.  Therapy evaluations completed patient was admitted for a comprehensive rehab program.   The pt defers to his daughter during the history "can't remember anything"   PTA has been quite active , driving, even occasional golf  Patient transferred to CIR on 12/18/2018 .    Patient currently requires max with basic self-care skills secondary to muscle weakness, decreased cardiorespiratoy endurance, decreased attention to right and decreased motor planning, decreased awareness, decreased problem solving, decreased safety awareness, decreased memory and delayed processing and decreased standing balance, decreased postural control and decreased balance strategies.  Prior to hospitalization, patient could complete BADLs with independent .  Patient will benefit from skilled  intervention to increase independence with basic self-care skills prior to discharge home with care partner.  Anticipate patient will require 24 hour supervision and follow up home health.  OT - End of Session Activity Tolerance: Tolerates 10 - 20 min activity with multiple rests Endurance Deficit: Yes Endurance Deficit Description: Pt needs several rest breaks following minimal activity during PT eval OT Assessment Rehab Potential (ACUTE ONLY): Good OT Patient demonstrates impairments  in the following area(s): Balance;Cognition;Endurance;Motor;Pain;Perception;Safety OT Basic ADL's Functional Problem(s): Bathing;Dressing;Toileting;Grooming OT Transfers Functional Problem(s): Toilet;Tub/Shower OT Additional Impairment(s): None OT Plan OT Intensity: Minimum of 1-2 x/day, 45 to 90 minutes OT Frequency: 5 out of 7 days OT Duration/Estimated Length of Stay: 14 days OT Treatment/Interventions: Balance/vestibular training;Cognitive remediation/compensation;Discharge planning;Disease mangement/prevention;DME/adaptive equipment instruction;Functional mobility training;Pain management;Patient/family education;Psychosocial support;Self Care/advanced ADL retraining;Therapeutic Activities;Therapeutic Exercise;UE/LE Strength taining/ROM;UE/LE Coordination activities;Visual/perceptual remediation/compensation OT Self Feeding Anticipated Outcome(s): I OT Basic Self-Care Anticipated Outcome(s): S OT Toileting Anticipated Outcome(s): S OT Bathroom Transfers Anticipated Outcome(s): S OT Recommendation Recommendations for Other Services: Speech consult Patient destination: Home Follow Up Recommendations: Home health OT Equipment Recommended: To be determined   Skilled Therapeutic Intervention Pt seen for initial evaluation and ADL training.  Pt was unable to state why he was in rehab or even why he came to the hospital.  Pt was not fully oriented and did not recall being in rehab in 2017.  Pt was very distracted by his R tooth pain.  He stated that no one has even looked at his tooth, but PA verified he had been examined by the physician.   Due to poor endurance and pain distraction, pt needed considerable A today.  Refer to ADL documentation below. Pt declined shower, agreeable to bathing at sink. Pt worked on bed mobilty, transfer bed to w/c and w/c to toilet, bathing and dressing.  At end of session, pt attempted to get up by himself even after just receiving an explanation about the safety  belt and the need to use the call light. Pt said he needed to toilet. Transferred pt to toilet and then RN came in as therapy session was over.   OT Evaluation Precautions/Restrictions  Precautions Precautions: Fall Restrictions Weight Bearing Restrictions: No Other Position/Activity Restrictions: B foot pain from gout/cellulitis    Vital Signs Therapy Vitals Pulse Rate: 87 BP: 107/65 Patient Position (if appropriate): Sitting Pain Pain Assessment Pain Scale: Faces Pain Score: 8  Faces Pain Scale: Hurts even more(by end of session) Pain Type: Acute pain Pain Location: Foot Pain Orientation: Right;Left Pain Descriptors / Indicators: Aching Pain Frequency: Constant(started yesterday) Pain Onset: On-going Pain Intervention(s): Repositioned;RN made aware Home Living/Prior Functioning Home Living Family/patient expects to be discharged to:: Assisted living Living Arrangements: Spouse/significant other Available Help at Discharge: Family, Available 24 hours/day(per notes, daughter to hire more care) Type of Home: Independent living facility Home Access: Stairs to enter Technical brewer of Steps: 1 Entrance Stairs-Rails: (unsure) Home Layout: One level Bathroom Shower/Tub: Multimedia programmer: Handicapped height Bathroom Accessibility: Yes Additional Comments: Pt's wife unable to physically assist. Report they live at [facility]   Lives With: Spouse Prior Function Level of Independence: Independent with gait  Able to Take Stairs?: Yes Driving: Yes(only some) Vocation: Retired Comments: retired Pharmacist, community ADL ADL Eating: Set up Grooming: Supervision/safety Where Assessed-Grooming: Sitting at sink Upper Body Bathing: Supervision/safety Where Assessed-Upper Body Bathing: Sitting at sink Lower Body Bathing: Maximal assistance Where Assessed-Lower Body Bathing: Sitting at sink, Standing at sink Upper Body Dressing: Minimal assistance Where  Assessed-Upper Body Dressing: Sitting at sink Lower Body Dressing: Dependent Where Assessed-Lower Body Dressing: Standing at sink, Sitting at sink Toileting: Dependent Where Assessed-Toileting: Glass blower/designer: Moderate assistance Toilet Transfer Method: Stand pivot Science writer: Grab bars Vision Baseline Vision/History: Wears glasses Wears Glasses: Reading only Patient Visual Report: No change from baseline Vision Assessment?: No apparent visual deficits Perception  Perception: Impaired Inattention/Neglect: Does not attend to right side of body(decreased, not full inattention) Praxis Praxis: Impaired Praxis Impairment Details: Motor planning Cognition Overall Cognitive Status: No family/caregiver present to determine baseline cognitive functioning Arousal/Alertness: Awake/alert Orientation Level: Person;Place Year: 2020 Month: (unable to recall with cues but did know it was 56 days until the election) Day of Week: Correct Memory: Impaired Memory Impairment: Decreased recall of new information;Decreased short term memory Immediate Memory Recall: Sock;Blue;Bed Memory Recall Sock: Not able to recall Memory Recall Blue: Not able to recall Memory Recall Bed: Not able to recall Attention: Sustained;Selective Sustained Attention: Appears intact Selective Attention: Impaired Selective Attention Impairment: Functional basic Awareness: Impaired Awareness Impairment: Intellectual impairment;Emergent impairment Problem Solving: Impaired Problem Solving Impairment: Functional basic Safety/Judgment: Impaired Comments: explained safety belt alarm and need to always call for assist to go to the bathroom, within 1 minute of leaving pt's room he tried to get up by himself Sensation Sensation Light Touch: Appears Intact Hot/Cold: Appears Intact Proprioception: Appears Intact Stereognosis: Appears Intact Coordination Gross Motor Movements are Fluid and Coordinated:  No Fine Motor Movements are Fluid and Coordinated: No Coordination and Movement Description: decreased coordination likely due to decreased strength. Motor  Motor Motor - Skilled Clinical Observations: Generalized weakness Mobility  Bed Mobility Bed Mobility: Rolling Right;Rolling Left;Left Sidelying to Sit;Sit to Supine Rolling Right: Minimal Assistance - Patient > 75% Rolling Left: Minimal Assistance - Patient > 75% Left Sidelying to Sit: Moderate Assistance - Patient 50-74% Sit to Supine: Moderate Assistance - Patient 50-74% Transfers Sit to Stand: Moderate Assistance - Patient 50-74% Stand to Sit: Moderate Assistance - Patient 50-74%  Trunk/Postural Assessment  Cervical Assessment Cervical Assessment: Exceptions to [facility] Cervical AROM Overall Cervical AROM Comments: Forward head Thoracic Assessment Thoracic Assessment: Exceptions to [facility] Thoracic AROM Overall Thoracic AROM Comments: Rounded shoulders, kyphotic posture Lumbar Assessment Lumbar Assessment: Exceptions to [facility] Lumbar AROM Overall Lumbar AROM Comments: increased posterior pelvic tilt Postural Control Postural Control: Deficits on evaluation Righting Reactions: decreased righting reaction in standing without support  Balance Balance Balance Assessed: Yes Static Sitting Balance Static Sitting - Level of Assistance: 5: Stand by assistance Dynamic Sitting Balance Dynamic Sitting - Level of Assistance: 4: Min assist;5: Stand by assistance Static Standing Balance Static Standing - Level of Assistance: 4: Min assist Dynamic Standing Balance Dynamic Standing - Level of Assistance: 3: Mod assist Extremity/Trunk Assessment RUE Assessment Passive Range of Motion (PROM) Comments: R shoulder limited to 45 degrees flexion Active Range of Motion (AROM) Comments: R shoulder flexion 20 degrees General Strength Comments: distal R AROM and strength WFL LUE Assessment LUE Assessment: Within Functional Limits     Refer to  Care Plan for Long Term Goals  Recommendations for other services: None  and Other: speech therapy consult   Discharge Criteria: Patient will be discharged from OT if patient refuses treatment 3 consecutive times without medical reason, if treatment goals not met, if there is a change in medical status, if patient makes no progress towards goals or if patient is discharged from hospital.  The above assessment, treatment plan, treatment alternatives and goals were discussed and mutually agreed upon:  No family available/patient unable  [place] 12/19/2018, 12:55 PM

## 2018-12-19 NOTE — Progress Notes (Signed)
Occupational Therapy Session Note  Patient Details  Name: David [name]. MRN: [id number] Date of Birth: 1940-10-03  Today's Date: 12/19/2018 OT Individual Time: 1657-9038 OT Individual Time Calculation (min): 75 min    Short Term Goals: Week 1:  OT Short Term Goal 1 (Week 1): Pt will be able to sit to stand with min A to prepare for clothing management with LB self care and toileting. OT Short Term Goal 2 (Week 1): Pt will be able to stand with min A to adjust clothing over hips. OT Short Term Goal 3 (Week 1): Pt will be able to don shirt with set up. OT Short Term Goal 4 (Week 1): Pt will be able to don pants with mod A. OT Short Term Goal 5 (Week 1): Pt will be able to transfer to the toilet with min A. Week 2:     Skilled Therapeutic Interventions/Progress Updates:  Balance/vestibular training;Cognitive remediation/compensation;Discharge planning;Disease mangement/prevention;DME/adaptive equipment instruction;Functional mobility training;Pain management;Patient/family education;Psychosocial support;Self Care/advanced ADL retraining;Therapeutic Activities;Therapeutic Exercise;UE/LE Strength taining/ROM;UE/LE Coordination activities;Visual/perceptual remediation/compensation   1:!Pt missed 45 min due to fatigue, pain in feet , high blood sugars. Pt in recliner slid down. Assisted to up right sitting position with min A. Attempted to transfer into w/c but pt reporting not feeling well. Decided he wanted to get into bed. Mod A stand pivot transfer into bed and minA into supine. Pt also reports his cold sore is hurting. REported to RN pt  Not feeling well and is back in bed. Did repor blood sugars are ~370  Therapy Documentation Precautions:  Precautions Precautions: Fall Restrictions Weight Bearing Restrictions: No Other Position/Activity Restrictions: B foot pain from gout/cellulitis General: General OT Amount of Missed Time: 45 Minutes Vital Signs: Therapy Vitals Pulse  Rate: 87 BP: 107/65 Patient Position (if appropriate): Sitting Pain: Pain Assessment Pain Scale: Faces Pain Score: 8  Faces Pain Scale: Hurts even more(by end of session) Pain Type: Acute pain Pain Location: Foot Pain Orientation: Right;Left Pain Descriptors / Indicators: Aching Pain Frequency: Constant(started yesterday) Pain Onset: On-going Pain Intervention(s): Repositioned;RN made aware   Therapy/Group: Individual Therapy  Willeen Cass [facility] 12/19/2018, 1:20 PM

## 2018-12-20 ENCOUNTER — Inpatient Hospital Stay (HOSPITAL_COMMUNITY): Payer: Medicare Other | Admitting: Physical Therapy

## 2018-12-20 ENCOUNTER — Ambulatory Visit: Payer: Medicare Other | Admitting: Radiation Oncology

## 2018-12-20 ENCOUNTER — Inpatient Hospital Stay (HOSPITAL_COMMUNITY): Payer: Medicare Other | Admitting: Occupational Therapy

## 2018-12-20 ENCOUNTER — Ambulatory Visit: Payer: Medicare Other | Admitting: Oncology

## 2018-12-20 DIAGNOSIS — M1039 Gout due to renal impairment, multiple sites: Secondary | ICD-10-CM

## 2018-12-20 DIAGNOSIS — L0390 Cellulitis, unspecified: Secondary | ICD-10-CM

## 2018-12-20 LAB — GLUCOSE, CAPILLARY
Glucose-Capillary: 99 mg/dL (ref 70–99)
Glucose-Capillary: 125 mg/dL — ABNORMAL HIGH (ref 70–99)
Glucose-Capillary: 248 mg/dL — ABNORMAL HIGH (ref 70–99)

## 2018-12-20 NOTE — Progress Notes (Signed)
Occupational Therapy Session Note  Patient Details  Name: David [name]. MRN: [id number] Date of Birth: October 07, 1940  Today's Date: 12/20/2018 OT Individual Time: 1050-1200 OT Individual Time Calculation (min): 70 min    Short Term Goals: Week 1:  OT Short Term Goal 1 (Week 1): Pt will be able to sit to stand with min A to prepare for clothing management with LB self care and toileting. OT Short Term Goal 2 (Week 1): Pt will be able to stand with min A to adjust clothing over hips. OT Short Term Goal 3 (Week 1): Pt will be able to don shirt with set up. OT Short Term Goal 4 (Week 1): Pt will be able to don pants with mod A. OT Short Term Goal 5 (Week 1): Pt will be able to transfer to the toilet with min A.  Skilled Therapeutic Interventions/Progress Updates:    Pt received in room finishing toileting with nurse tech. He ambulated out of bathroom with RW putting wt only through his heels as he said the bottoms of his feet were so sore.   Pt taken to gym and completed transfer to mat with min A to work on UE and core strengthening exercises with 6 lb med ball for torso twists and abdominal crunches 12 x 3 sets.  Using 2 lb dowel bar row boat ex forward 15 and back 15, 3 sets followed by 1 set of side to side kayaking movements.  Pt fatigued after each set needing short rest breaks.   Standing balance and endurance at parallel bars standing on foam balance pad to reduce discomfort on his feet.    Standing for 2-3 min at a time working on heel raised, alt knee raises and alternating stepping patterns. Resting 2 min between each set of standing.   Pt taken back to room and used RW to transfer back to bed with min A.  Set up in bed to prepare for lunch, alarm set and all needs met.  Therapy Documentation Precautions:  Precautions Precautions: Fall Restrictions Weight Bearing Restrictions: No Other Position/Activity Restrictions: B foot pain from gout/cellulitis   Pain: Pain  Assessment Pain Scale: 0-10 Pain Score: 0-No pain(does c/o bottoms of feet being sore)    Therapy/Group: Individual Therapy  [place] 12/20/2018, 11:39 AM

## 2018-12-20 NOTE — Progress Notes (Signed)
[facility] PHYSICAL MEDICINE & REHABILITATION PROGRESS NOTE   Subjective/Complaints: Pt reports the R jaw pain didn't localize to 1 tooth, however is much better today and slept much better overnight.  Just asking for Sympathy for his situation.   Objective:   No results found. Recent Labs    12/19/18 0521  WBC 6.2  HGB 11.7*  HCT 34.0*  PLT 130*   Recent Labs    12/17/18 1524 12/19/18 0521  NA 135 135  K 5.1 4.4  CL 107 104  CO2 17* 20*  GLUCOSE 287* 123*  BUN 70* 63*  CREATININE 1.99* 1.67*  CALCIUM 8.0* 8.6*    Intake/Output Summary (Last 24 hours) at 12/20/2018 1238 Last data filed at 12/20/2018 5102 Gross per 24 hour  Intake -  Output 702 ml  Net -702 ml     Physical Exam: Vital Signs Blood pressure 116/84, pulse 78, temperature 98.6 F (37 C), temperature source Oral, resp. rate 18, height 5\' 9"  (1.753 m), weight 92.2 kg, SpO2 99 %.  Neurological:  Reviewed vitals, nursing notes and labs. Pt awake, alert, appropriate, Ox2-3 sitting up in manual w/c in room, PT at side, NAD; much more comfortable than yesterday- c/o NO pain. Mood and affect are appropriate Heart: Regular rate and rhythm  Lungs: Clear to auscultation B/L Abdomen: Positive bowel sounds, soft, NT, ND Extremities: No clubbing, cyanosis, or edema Skin: No evidence of breakdown, no evidence of rash, not even on face, where was complaining of pain Neurologic:  motor strength is 5/5 in bilateral deltoid, bicep, tricep, grip, 4/5 Bilateral hip flexor, knee extensors, ankle dorsiflexor and plantar flexor Sensory exam normal sensation to light touch and proprioception  bilateral upper and lower extremities No skin breakdown, rash on face/no swelling on R lower jaw or that could see inside mouth that would signal a tooth needs attention.   Assessment/Plan: 1. Functional deficits secondary to Debility with recent cellulitis and gout flare which require 3+ hours per day of interdisciplinary  therapy in a comprehensive inpatient rehab setting.  Physiatrist is providing close team supervision and 24 hour management of active medical problems listed below.  Physiatrist and rehab team continue to assess barriers to discharge/monitor patient progress toward functional and medical goals  Care Tool:  Bathing    Body parts bathed by patient: Right arm, Left arm, Chest, Abdomen, Front perineal area, Right upper leg, Left upper leg, Face, Buttocks   Body parts bathed by helper: Right lower leg, Left lower leg     Bathing assist Assist Level: Minimal Assistance - Patient > 75%     Upper Body Dressing/Undressing Upper body dressing   What is the patient wearing?: Pull over shirt, Button up shirt    Upper body assist Assist Level: Supervision/Verbal cueing    Lower Body Dressing/Undressing Lower body dressing      What is the patient wearing?: Underwear/pull up, Pants     Lower body assist Assist for lower body dressing: Contact Guard/Touching assist     Toileting Toileting    Toileting assist Assist for toileting: Independent with assistive device     Transfers Chair/bed transfer  Transfers assist     Chair/bed transfer assist level: Minimal Assistance - Patient > 75%     Locomotion Ambulation   Ambulation assist      Assist level: Minimal Assistance - Patient > 75% Assistive device: Walker-rolling Max distance: 15'   Walk 10 feet activity   Assist     Assist level: Minimal Assistance -  Patient > 75% Assistive device: Walker-rolling   Walk 50 feet activity   Assist Walk 50 feet with 2 turns activity did not occur: Safety/medical concerns         Walk 150 feet activity   Assist Walk 150 feet activity did not occur: Safety/medical concerns         Walk 10 feet on uneven surface  activity   Assist Walk 10 feet on uneven surfaces activity did not occur: Safety/medical concerns         Wheelchair     Assist Will  patient use wheelchair at discharge?: Yes Type of Wheelchair: Manual    Wheelchair assist level: Minimal Assistance - Patient > 75% Max wheelchair distance: 150'    Wheelchair 50 feet with 2 turns activity    Assist    Wheelchair 50 feet with 2 turns activity did not occur: Safety/medical concerns   Assist Level: Minimal Assistance - Patient > 75%   Wheelchair 150 feet activity     Assist  Wheelchair 150 feet activity did not occur: Safety/medical concerns   Assist Level: Minimal Assistance - Patient > 75%   Blood pressure 116/84, pulse 78, temperature 98.6 F (37 C), temperature source Oral, resp. rate 18, height 5\' 9"  (1.753 m), weight 92.2 kg, SpO2 99 %.  Medical Problem List and Plan: 1.Debilitysecondary to generalized weakness related to recent cellulitis with gout flareup as well as history of malignant melanoma with metastasis to the brain 2. Antithrombotics: -DVT/anticoagulation:Lovenox -antiplatelet therapy: Aspirin 81 mg daily 3. Pain Management:Tylenol as needed 4. Mood:Celexa 10 mg daily. Provide emotional support -antipsychotic agents: N/A 5. Neuropsych: This patientis ? capable of making decisions on hisown behalf. 6. Skin/Wound Care:Routine skin checks 7. Fluids/Electrolytes/Nutrition:Routine in and outs with follow-up chemistries 8. Left foot pain and swelling related to acute gout. Complete prednisone taper 9. 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 Status post craniotomy 2017 with chemotherapy as well as radiation. Followed by Dr. Burr Medico as well as Dr. Mickeal Skinner 10. Leukocytosis as well as history of pancytopenia. Likely secondary to recent Granix injection. Urinalysis study no growth  9/10- WBC down to 6.2k- will recheck weekly 11. Diabetes mellitus. Hemoglobin A1c 7.4. Low-dose Glucotrol 2.5 mg  daily. Check blood sugars before meals and at bedtime 12. Hypertension. Norvasc 5 mg daily, Toprol 75 mg twice daily. Monitor with increased mobility 13. AKI superimposed on CKD stage III. Creatinine stabilizing after gentle IV fluids. Follow-up chemistries  9/10- Cr down to 1.67 and Bun down to 63 from 70  9/11- will recheck Monday 14. CAD with CABG. Continue aspirin therapy no chest pain 15. Hypothyroidism. Synthroid 16. Hyperlipidemia. Crestor  17. Toothache  9/10- pt was emphatic tylenol not helpful, nor tramadol (in past)- wants something stronger- spoke to pt's daughter, MD here at [facility] with Norco prn- ordered  9/11- didn't localize to 1 tooth, but feeling much better.   LOS: 2 days A FACE TO FACE EVALUATION WAS PERFORMED  Rehana Uncapher 12/20/2018, 12:38 PM

## 2018-12-20 NOTE — IPOC Note (Signed)
Overall Plan of Care (IPOC) Patient Details Name: David [name]. MRN: [id number] DOB: 1940/07/10  Admitting Diagnosis: <principal problem not specified>  Hospital Problems: Active Problems:   Debility   Acute gout due to renal impairment involving multiple sites   Cellulitis     Functional Problem List: Nursing Endurance, Motor, Safety, Skin Integrity  PT Balance, Endurance, Motor, Pain, Safety  OT Balance, Cognition, Endurance, Motor, Pain, Perception, Safety  SLP    TR         Basic ADL's: OT Bathing, Dressing, Toileting, Grooming     Advanced  ADL's: OT       Transfers: PT Bed Mobility, Bed to Chair, Car, Manufacturing systems engineer, Metallurgist: PT Ambulation, Emergency planning/management officer, Stairs     Additional Impairments: OT None  SLP        TR      Anticipated Outcomes Item Anticipated Outcome  Self Feeding I  Swallowing      Basic self-care  S  Toileting  S   Bathroom Transfers S  Bowel/Bladder  Continent to bladder and bowel with min. assist.  Transfers  S level with with RW  Locomotion  S level for short distances with RW, w/c use for community distances  Communication     Cognition     Pain  Less than 3,on 1 to 10 scale.  Safety/Judgment  Free from falls during his stay in rehab.   Therapy Plan: PT Intensity: Minimum of 1-2 x/day ,45 to 90 minutes PT Frequency: 5 out of 7 days PT Duration Estimated Length of Stay: 14-16 days OT Intensity: Minimum of 1-2 x/day, 45 to 90 minutes OT Frequency: 5 out of 7 days OT Duration/Estimated Length of Stay: 14 days     Due to the current state of emergency, patients may not be receiving their 3-hours of Medicare-mandated therapy.   Team Interventions: Nursing Interventions Patient/Family Education, Disease Management/Prevention  PT interventions Ambulation/gait training, Training and development officer, Cognitive remediation/compensation, Discharge planning, DME/adaptive equipment  instruction, Functional mobility training, Neuromuscular re-education, Pain management, Patient/family education, Stair training, Therapeutic Activities, Therapeutic Exercise, UE/LE Strength taining/ROM, Wheelchair propulsion/positioning  OT Interventions Balance/vestibular training, Cognitive remediation/compensation, Discharge planning, Disease mangement/prevention, DME/adaptive equipment instruction, Functional mobility training, Pain management, Patient/family education, Psychosocial support, Self Care/advanced ADL retraining, Therapeutic Activities, Therapeutic Exercise, UE/LE Strength taining/ROM, UE/LE Coordination activities, Visual/perceptual remediation/compensation  SLP Interventions    TR Interventions    SW/CM Interventions Discharge Planning, Psychosocial Support, Patient/Family Education   Barriers to Discharge MD  Medical stability, Neurogenic bowel and bladder, Behavior and pain from gout and toothache impairing therapy as well as some urinary retention  Nursing      PT Decreased caregiver support Per notes, daughter is going to hire more care for ILF, needs 24/7  OT      SLP      SW       Team Discharge Planning: Destination: PT-Assisted Living(ILF w/ 24/7 care) ,OT- Home , SLP-  Projected Follow-up: PT-Home health PT, 24 hour supervision/assistance, OT-  Home health OT, SLP-  Projected Equipment Needs: PT-Rolling walker with 5" wheels, OT- To be determined, SLP-  Equipment Details: PT-w/c to be determined, OT-  Patient/family involved in discharge planning: PT- Patient,  OT-Patient unable/family or caregiver not available, SLP-   MD ELOS: 14-16 days Medical Rehab Prognosis:  Good Assessment: Pt is a 78 yr old male with hx of cancer with mets to brain with recent hospitalization for cellulitis and gout flare with  resulting debility.  His goals are Supervision for ADLs and mobility.   See Team Conference Notes for weekly updates to the plan of care

## 2018-12-20 NOTE — Evaluation (Signed)
Speech Language Pathology Assessment and Plan  Patient Details  Name: David [name]. MRN: [id number] Date of Birth: March 05, 1941  SLP Diagnosis: Cognitive Impairments  Rehab Potential: Good ELOS: 14 days    Today's Date: 12/20/2018 SLP Individual Time: 2353-6144 SLP Individual Time Calculation (min): 18 min   Problem List:  Patient Active Problem List   Diagnosis Date Noted  . Acute gout due to renal impairment involving multiple sites   . Cellulitis   . Debility 12/18/2018  . AKI (acute kidney injury) ([place]) 12/17/2018  . DM type 2 (diabetes mellitus, type 2) ([place]) 12/17/2018  . Weakness generalized 12/16/2018  . Altered mental status 12/09/2018  . Sepsis ([place]) 12/09/2018  . Fever 12/09/2018  . Neutropenia with fever ([place]) 12/09/2018  . Thrombocytopenia ([place]) 12/09/2018  . Acute pain of left wrist 11/15/2018  . Insomnia 10/14/2018  . Heart block AV complete ([place]) 10/01/2018  . Presence of biventricular cardiac pacemaker 10/01/2018  . Hypogonadism in male 04/18/2018  . Dandruff in adult 03/27/2018  . Dyslipidemia 03/27/2018  . Acute pain of right shoulder 03/11/2018  . Elevated PSA 12/24/2017  . Prediabetes 12/24/2017  . Hx of CABG 07/17/2017  . Alcohol use 04/19/2017  . Cognitive impairment 02/25/2017  . History of implantation of joint prosthesis of elbow 02/12/2017  . Osteoarthritis of knee 02/12/2017  . Hypercalcemia 11/01/2016  . Nontraumatic cortical hemorrhage of left cerebral hemisphere ([place]) 06/27/2016  . Hematochezia 05/06/2016  . Advanced care planning/counseling discussion 05/03/2016  . Atherosclerosis of native coronary artery of native heart with stable angina pectoris ([place]) 04/24/2016  . Ischemic cardiomyopathy 04/24/2016  . Acquired hypothyroidism 11/24/2015  . Metastatic melanoma to lung ([place])   . MDD (major depressive disorder), recurrent episode, moderate ([place])   . Benign prostatic hyperplasia   . Metastasis to brain ([place]) 10/15/2015  .  Malignant melanoma ([place]) 09/22/2015  . Lung mass   . Chronic kidney disease, stage III (moderate) (H[facility])   . Overweight with body mass index (BMI) 25.0-29.9 08/26/2015  . Incontinence 08/26/2015  . Hemorrhagic stroke ([place]) 08/26/2015  . Hemiplegia and hemiparesis following nontraumatic intracerebral hemorrhage affecting right dominant side ([place]) 08/26/2015  . Aphasia following nontraumatic intracerebral hemorrhage 08/26/2015  . Gait disturbance, post-stroke 08/26/2015  . Benign essential HTN   . OSA on CPAP   . Biventricular ICD (implantable cardioverter-defibrillator) in place   . ICH (intracerebral hemorrhage) (HCC) - L frontal due to HTN vs CAA 08/22/2015   Past Medical History:  Past Medical History:  Diagnosis Date  . Anginal pain ([place])   . Arthritis    mostly hands  . Benign familial hematuria   . BPH (benign prostatic hypertrophy)   . Brain cancer ([place])    melanoma with brain met  . Coronary artery disease   . GERD (gastroesophageal reflux disease)   . High cholesterol   . Hypertension   . Hypothyroidism   . Intracerebral hemorrhage ([place])   . Melanoma ([place])   . Metastatic melanoma to lung ([place])   . Mood disorder ([place])   . Myocardial infarction ([place])   . Pacemaker    due to syncope, 3rd degree HB (upgrade to [facility]. Jude CRT-P 02/25/13 (Dr. Uvaldo Rising)  . Rectal bleed    due to NSAIDS  . Renal disorder   . Sepsis ([place]) 12/09/2018  . Skin cancer    melanoma  . Sleep apnea    uses CPAP  . Stroke [facility])    Past Surgical History:  Past Surgical History:  Procedure Laterality Date  . APPLICATION OF CRANIAL NAVIGATION N/A 10/15/2015   Procedure: APPLICATION OF CRANIAL NAVIGATION;  Surgeon: Erline Levine, MD;  Location: [facility] NEURO ORS;  Service: Neurosurgery;  Laterality: N/A;  . CARDIAC CATHETERIZATION  2013  . CARDIAC SURGERY     bypass X 2  . COLONOSCOPY WITH PROPOFOL N/A 05/08/2016   diverticulosis, int hem, no f/u needed [name])  . CORONARY ARTERY BYPASS GRAFT  2013    LIMA-LAD, SVG-PDA 03/27/11 (Dr. Francee Gentile)  . CRANIOTOMY N/A 10/15/2015   Procedure: LEFT FRONTAL CRANIOTOMY TUMOR EXCISION with Curve;  Surgeon: Erline Levine, MD;  Location: [facility] NEURO ORS;  Service: Neurosurgery;  Laterality: N/A;  CRANIOTOMY TUMOR EXCISION  . EYE SURGERY    . FOOT SURGERY  08/2010  . JOINT REPLACEMENT Left    partial knee  . KNEE ARTHROSCOPY  12/2008  . LYMPH NODE BIOPSY    . PACEMAKER INSERTION    . TONSILLECTOMY      Assessment / Plan / Recommendation Clinical Impression   David [name]. David [name], David [name]. is a 78 year old right-handed male with history of nontraumatic intracerebral hemorrhage secondary to hypertensive crisis affecting the right side June 2017 receiving inpatient rehab services 08/26/2015 09/10/2015 and was discharged to skilled nursing facility [facility].  Patient also with history of prostate cancer, melanoma with brain mets diagnosed 2017 status post left frontal craniotomy, tumor excision and radiation therapy to the brain and chemotherapy complicated by leukopenia, thrombocytopenia followed by Dr. Burr [name] and Dr. Mickeal [name], hypothyroidism, hypertension, CAD with CABG, biventricular ICD.  Patient recently hospitalized 12/08/2018 for right foot cellulitis, pancytopenia and did receive Granix on 12/10/2018.  Patient was placed on IV antibiotics as well as p.o. steroids and discharged home on p.o. doxycycline for 3 days.  Per chart review patient lives with wife David [name] independent living facility.  1 level home.  Wife reportedly with some Alzheimer's disease and cannot provide physical assistance.  Daughter/Dr. Silas [name] is a local physician in the area.  Family plans to hire 24-hour care as needed at time of discharge.  Presented 12/16/2018 with polyuria, polydipsia, generalized weakness and body aches x3 days.  Noted blood sugars in the 400s.  COVID negative, urinalysis negative nitrite with urine culture no growth, CK 19, hemoglobin A1c 7.4, WBC 26,600, creatinine 2.23 with  baseline 2.04 in late August however reported 09/23/2018 creatinine 1.56.  Chest x-ray negative, recent renal ultrasound showed some increased cortical echogenicity and mild cortical thinning bilaterally bilateral complex and simple appearing renal cyst.  Normal appearance of the urinary bladder.  Patient was placed on gentle IV fluids.  Placed on low-dose prednisone for suspected gout left foot with uric acid level within normal limits sedimentation rate mildly elevated 26.  Leukocytosis felt to be related to recent Granix injection.  Subcutaneous Lovenox for DVT prophylaxis.  Renal function stabilizing 1.99 creatinine as well as WBC trending down to 20,000.  Therapy evaluations completed patient was admitted for a comprehensive rehab program.  Cognitive linguistic evaluation completed on 12/20/18. Pt presents with mild deficits in cognition over reported baseline abilities. At baseline pt was very active, read [employer], played golf and provides care for wife. During this evaluation, pt demonstrates deficits in selective attention, semi-complex problem solving and awareness. Skilled ST would be beneficial to target these deficits and increase functional independence.    Skilled Therapeutic Interventions          Skilled treatment session focused on completing the above mentioned assessment. SLP also spoke with pt's  daughter via phone to help establish baseline ability as well as ST POC. All questions answered at this time.    SLP Assessment  Patient will need skilled [facility] during CIR admission    Recommendations  Recommendations for Other Services: Neuropsych consult Patient destination: Assisted Living Follow up Recommendations: 24 hour supervision/assistance Equipment Recommended: None recommended by SLP    SLP Frequency 3 to 5 out of 7 days   SLP Duration  SLP Intensity  SLP Treatment/Interventions 14 days  Minumum of 1-2 x/day, 30 to 90 minutes     Cognition   Pain Pain Assessment Pain Scale: 0-10 Pain Score: 0-No pain(does c/o bottoms of feet being sore)  Prior Functioning Cognitive/Linguistic Baseline: Within functional limits Type of Home: Independent living facility  Lives With: Spouse Available Help at Discharge: Family;Available 24 hours/day;Other (Comment)(will hire care givers) Vocation: Retired  Industrial/product designer Term Goals: Week 1: SLP Short Term Goal 1 (Week 1): Pt will demonstrate intellectual awareness by listing 2 cognitive and 2 physical deficits related to acute situation with Mod A cues. SLP Short Term Goal 2 (Week 1): Pt will demonstrate selective attention for ~ 30 minutes with Min A cues. SLP Short Term Goal 3 (Week 1): Pt will complete sem-complex problem solving tasks with Min A cues.  Refer to Care Plan for Long Term Goals  Recommendations for other services: Neuropsych  Discharge Criteria: Patient will be discharged from SLP if patient refuses treatment 3 consecutive times without medical reason, if treatment goals not met, if there is a change in medical status, if patient makes no progress towards goals or if patient is discharged from hospital.  The above assessment, treatment plan, treatment alternatives and goals were discussed and mutually agreed upon: by patient and by family  Orpheus Hayhurst 12/20/2018, 2:35 PM

## 2018-12-20 NOTE — Progress Notes (Signed)
Physical Therapy Session Note  Patient Details  Name: David [name]. MRN: [id number] Date of Birth: 1940-08-11  Today's Date: 12/20/2018 PT Individual Time: 0900-1000 PT Individual Time Calculation (min): 60 min   Short Term Goals: Week 1:  PT Short Term Goal 1 (Week 1): Pt will perform sit<>stand at min A level with mod cues for technique. PT Short Term Goal 2 (Week 1): Pt will propel w/c with BUEs and/or LEs x 50' at S level. PT Short Term Goal 3 (Week 1): Pt will ambulate x 50' with RW at min A level  Skilled Therapeutic Interventions/Progress Updates:    Pt received seated in recliner in room, agreeable to PT session. No complaints of pain. Sit to stand with min A to RW throughout session. Ambulation x 15 ft from recliner to w/c with RW and min A. Pt reports some tenderness in feet during gait 2/2 gout. Per pt his daughter is working on bringing him tennis shoes for improved comfort and tolerance for gait and standing tasks. Pt reports urge to urinate, setup A for urinal, pt unable to void. Stand pivot transfer w/c to mat table with min A. Sit to supine Supervision. Supine BLE strengthening therex x 10-15 reps with 3# ankle weights: heel slides, hip abd, SLR, bridges, SKFO. Supine to sit with min A for trunk control. Sit to stand with min A to RW onto airex. Standing balance x 5 min on airex with intermittent UE support on RW and CGA while performing bean bag toss. Sit to stand x 5 reps to RW from mat table with CGA progressing to SBA. Stand pivot transfer back to w/c with RW and CGA. Manual w/c propulsion x 150 ft with use of BUE and min A for steering. Sit to stand CGA to RW. Ambulation x 15 ft from w/c back to recliner with RW and min A. Pt agreeable to stay seated in recliner until next session, needs in reach, chair alarm in place.  Therapy Documentation Precautions:  Precautions Precautions: Fall Restrictions Weight Bearing Restrictions: No Other Position/Activity  Restrictions: B foot pain from gout/cellulitis    Therapy/Group: Individual Therapy   Excell Seltzer, PT, DPT  12/20/2018, 11:12 AM

## 2018-12-20 NOTE — Progress Notes (Addendum)
[facility] Individual Statement of Services  Patient Name:  David [name].  Date:  12/20/2018  Welcome to the [facility].  Our goal is to provide you with an individualized program based on your diagnosis and situation, designed to meet your specific needs.  With this comprehensive rehabilitation program, you will be expected to participate in at least 3 hours of rehabilitation therapies Monday-Friday, with modified therapy programming on the weekends.  Your rehabilitation program will include the following services:  Physical Therapy (PT), Occupational Therapy (OT), 24 hour per day rehabilitation nursing, Case Management (Social Worker), Rehabilitation Medicine, Nutrition Services and Pharmacy Services  Weekly team conferences will be held on Wednesdays to discuss your progress.  Your Social Worker will talk with you frequently to get your input and to update you on team discussions.  Team conferences with you and your family in attendance may also be held.  Expected length of stay:  10 days  Overall anticipated outcome:  Supervision with minimal assistance for stairs  Depending on your progress and recovery, your program may change. Your Social Worker will coordinate services and will keep you informed of any changes. Your Social Worker's name and contact numbers are listed  below.  The following services may also be recommended but are not provided by the [facility] will be made to provide these services after discharge if needed.  Arrangements include referral to agencies that provide these services.  Your insurance has been verified to be:  Medicare and Medicare supplement Your primary doctor is:  Dr. [name]  Pertinent information will be shared with your doctor and your insurance  company.  Social Worker:  Alfonse Alpers, LCSW  [phone] or ([phone]  Information discussed with and copy given to patient by: Trey Sailors, 12/20/2018, 9:56 AM

## 2018-12-20 NOTE — Progress Notes (Signed)
Social Work Assessment and Plan   Patient Details  Name: David [name]. MRN: [id number] Date of Birth: 08-16-1940  Today's Date: 12/20/2018  Problem List:  Patient Active Problem List   Diagnosis Date Noted  . Acute gout due to renal impairment involving multiple sites   . Cellulitis   . Debility 12/18/2018  . AKI (acute kidney injury) ([place]) 12/17/2018  . DM type 2 (diabetes mellitus, type 2) ([place]) 12/17/2018  . Weakness generalized 12/16/2018  . Altered mental status 12/09/2018  . Sepsis ([place]) 12/09/2018  . Fever 12/09/2018  . Neutropenia with fever ([place]) 12/09/2018  . Thrombocytopenia ([place]) 12/09/2018  . Acute pain of left wrist 11/15/2018  . Insomnia 10/14/2018  . Heart block AV complete ([place]) 10/01/2018  . Presence of biventricular cardiac pacemaker 10/01/2018  . Hypogonadism in male 04/18/2018  . Dandruff in adult 03/27/2018  . Dyslipidemia 03/27/2018  . Acute pain of right shoulder 03/11/2018  . Elevated PSA 12/24/2017  . Prediabetes 12/24/2017  . Hx of CABG 07/17/2017  . Alcohol use 04/19/2017  . Cognitive impairment 02/25/2017  . History of implantation of joint prosthesis of elbow 02/12/2017  . Osteoarthritis of knee 02/12/2017  . Hypercalcemia 11/01/2016  . Nontraumatic cortical hemorrhage of left cerebral hemisphere ([place]) 06/27/2016  . Hematochezia 05/06/2016  . Advanced care planning/counseling discussion 05/03/2016  . Atherosclerosis of native coronary artery of native heart with stable angina pectoris ([place]) 04/24/2016  . Ischemic cardiomyopathy 04/24/2016  . Acquired hypothyroidism 11/24/2015  . Metastatic melanoma to lung ([place])   . MDD (major depressive disorder), recurrent episode, moderate ([place])   . Benign prostatic hyperplasia   . Metastasis to brain ([place]) 10/15/2015  . Malignant melanoma ([place]) 09/22/2015  . Lung mass   . Chronic kidney disease, stage III (moderate) (HCC)   . Overweight with body mass index (BMI) 25.0-29.9 08/26/2015  .  Incontinence 08/26/2015  . Hemorrhagic stroke ([place]) 08/26/2015  . Hemiplegia and hemiparesis following nontraumatic intracerebral hemorrhage affecting right dominant side ([place]) 08/26/2015  . Aphasia following nontraumatic intracerebral hemorrhage 08/26/2015  . Gait disturbance, post-stroke 08/26/2015  . Benign essential HTN   . OSA on CPAP   . Biventricular ICD (implantable cardioverter-defibrillator) in place   . ICH (intracerebral hemorrhage) (HCC) - L frontal due to HTN vs CAA 08/22/2015   Past Medical History:  Past Medical History:  Diagnosis Date  . Anginal pain ([place])   . Arthritis    mostly hands  . Benign familial hematuria   . BPH (benign prostatic hypertrophy)   . Brain cancer ([place])    melanoma with brain met  . Coronary artery disease   . GERD (gastroesophageal reflux disease)   . High cholesterol   . Hypertension   . Hypothyroidism   . Intracerebral hemorrhage ([place])   . Melanoma ([place])   . Metastatic melanoma to lung ([place])   . Mood disorder ([place])   . Myocardial infarction ([place])   . Pacemaker    due to syncope, 3rd degree HB (upgrade to [facility]. Jude CRT-P 02/25/13 (Dr. Uvaldo Rising)  . Rectal bleed    due to NSAIDS  . Renal disorder   . Sepsis ([place]) 12/09/2018  . Skin cancer    melanoma  . Sleep apnea    uses CPAP  . Stroke [facility])    Past Surgical History:  Past Surgical History:  Procedure Laterality Date  . APPLICATION OF CRANIAL NAVIGATION N/A 10/15/2015   Procedure: APPLICATION OF CRANIAL NAVIGATION;  Surgeon: Erline Levine, MD;  Location: [facility]  NEURO ORS;  Service: Neurosurgery;  Laterality: N/A;  . CARDIAC CATHETERIZATION  2013  . CARDIAC SURGERY     bypass X 2  . COLONOSCOPY WITH PROPOFOL N/A 05/08/2016   diverticulosis, int hem, no f/u needed [name])  . CORONARY ARTERY BYPASS GRAFT  2013   LIMA-LAD, SVG-PDA 03/27/11 (Dr. Francee Gentile)  . CRANIOTOMY N/A 10/15/2015   Procedure: LEFT FRONTAL CRANIOTOMY TUMOR EXCISION with Curve;  Surgeon: Erline Levine, MD;  Location: [place]  NEURO ORS;  Service: Neurosurgery;  Laterality: N/A;  CRANIOTOMY TUMOR EXCISION  . EYE SURGERY    . FOOT SURGERY  08/2010  . JOINT REPLACEMENT Left    partial knee  . KNEE ARTHROSCOPY  12/2008  . LYMPH NODE BIOPSY    . PACEMAKER INSERTION    . TONSILLECTOMY     Social History:  reports that he quit smoking about 61 years ago. He has a 3.00 pack-year smoking history. He has never used smokeless tobacco. He reports current alcohol use of about 14.0 standard drinks of alcohol per week. He reports that he does not use drugs.  Family / Support Systems Marital Status: Married Patient Roles: Spouse, Parent(Pt's wife had dementia.  Dtr is an OB/GYN with [employer].) Spouse/Significant Other: Adin Laker - wife - [phone] Children: Silas [place] - dtr - [phone] Other Supports: son Anticipated Caregiver: hired caregivers Ability/Limitations of Caregiver: Pt's children to arrange this as needed for pt.  Wife cannot really assist pt. Caregiver Availability: 24/7 Family Dynamics: supportive children  Social History Preferred language: English Religion: Non-Denominational Education: Pt is a retired Pharmacist, community. Read: Yes Write: Yes Employment Status: Retired Date Retired/Disabled/Unemployed: 2007 Age Retired: 65 Public relations account executive Issues: none reported Guardian/Conservator: MD is questioning if pt is fully capable of making his own decision.  Pt's dtr is an OB/GYN and can help him make medical decisions.   Abuse/Neglect Abuse/Neglect Assessment Can Be Completed: Yes Physical Abuse: Denies Verbal Abuse: Denies Sexual Abuse: Denies Exploitation of patient/patient's resources: Denies Self-Neglect: Denies  Emotional Status Pt's affect, behavior and adjustment status: Pt was in a lot of pain during CSW's assessment and did not seem to remember CSW from last admission.  Pt was aware that he was not remembering things correctly or putting thought together the way he  wanted to. Recent Psychosocial Issues: Pt was back to driving and playing golf again following his CIR stay in 2017.  He also helps care for his wife. Psychiatric History: none reported Substance Abuse History: non reported  Patient / Family Perceptions, Expectations & Goals Pt/Family understanding of illness & functional limitations: Pt's dtr has a good understanding of pt's condition.  She is asking for needed level of assistance at home at d/c for pt, as it will be greater than it was PTA. Premorbid pt/family roles/activities: Pt was driving, golfing, visiting with friends, helping his wife, etc. Anticipated changes in roles/activities/participation: Pt would like to return to these activities, but knows it may be a while. Pt/family expectations/goals: Pt could not identify a goal due to pain.  Dtr wants him to be as independent as possible at d/c.  Community Resources [employer]: Other (Comment)(pt and wife live at [place]) Premorbid Home Care/DME Agencies: Other (Comment)([place] caregiver and pt's dtr's nanny assists.) Transportation available at discharge: family Resource referrals recommended: Neuropsychology  Discharge Planning Living Arrangements: Spouse/significant other Support Systems: Spouse/significant other, Children, Other relatives, Friends/neighbors, Other (Comment)([place]) Type of Residence: [place] Name: [facility]  Resources: [employer], Multimedia programmer (specify)(Medicare supplement) Financial Resources: Radio broadcast assistant Screen Referred: No Money Management: Patient Does the patient have any problems obtaining your medications?: No Home Management: Pt takes care of this, with assistance of paid caregivers. Patient/Family Preliminary Plans: Pt to return to his home with paid caregivers to assist him and his wife. Social Work Anticipated Follow Up Needs: HH/OP Expected length of stay:  10 days  Clinical Impression CSW met with pt to reintroduce self and role of CSW, as pt was on CIR in 2017.  He was in a lot of pain and didn't really seem to remember CSW.  Pt had improved after last admission and was really able to do anything he wanted to and was independent overall.  Pt admits during this visit that he's having trouble remembering and processing through questions to give an answer.  CSW expressed understanding and offered encouragement.  He gave CSW permission to call his dtr and CSW did.  She was hoping pt would not need to stay very long and would come home more independent.  Explained to her that team is recommending 24/7 supervision at first, but hope they can back off that after they see how pt is doing at home.  No other concerns/questions/needs at this point.  CSW will continue to follow and assist as needed.  Varetta Chavers, [name] 12/20/2018, 2:22 PM

## 2018-12-20 NOTE — Progress Notes (Signed)
Occupational Therapy Session Note  Patient Details  Name: David [name]. MRN: [id number] Date of Birth: [birth year]  Today's Date: 12/20/2018 OT Individual Time: 1740-8144 OT Individual Time Calculation (min): 60 min    Short Term Goals: Week 1:  OT Short Term Goal 1 (Week 1): Pt will be able to sit to stand with min A to prepare for clothing management with LB self care and toileting. OT Short Term Goal 2 (Week 1): Pt will be able to stand with min A to adjust clothing over hips. OT Short Term Goal 3 (Week 1): Pt will be able to don shirt with set up. OT Short Term Goal 4 (Week 1): Pt will be able to don pants with mod A. OT Short Term Goal 5 (Week 1): Pt will be able to transfer to the toilet with min A.  Skilled Therapeutic Interventions/Progress Updates:     Pt seen for OT ADL bathing/dressing session. Pt awake in supine upon arrival, agreeable to tx session and denying pain.  He transferred to sitting EOB with supervision using hospital bed functions. He donned socks seated EOB with CGA for dynamic sitting blaance.  Min A stand pivot transfer with RW to w/c. Pt agreeable to bathing at shower level this AM. He ambulated with RW and min-mod A into bathroom, VCs for RW management in functional context. He bathed seated on shower, assist for washing B feet and min-mod steadying assist while pt stood to complete pericare/buttock hygiene. Rest breaks required throughout bathing task.  STand pivot to return to w/c from shower seat for energy conservation. He dressed seated in w/c, min A overall for steadying while pulling pants up and assist to advance socks entirely over TED hose. UB dressing completed with set-up/supervision. Increased time and rest breaks required throughout seated level dressing tasks 2/2 decreased functional activity tolerance.  Following seated rest break, he ambulated to sink and completed oral care and hair brushing from standing position with steadying assist,  alternating UE support for balance.  Pt returned to recliner at end of session, left seated with all needs in reach and chair pad alarm on.   Therapy Documentation Precautions:  Precautions Precautions: Fall Restrictions Weight Bearing Restrictions: No Other Position/Activity Restrictions: B foot pain from gout/cellulitis Pain:   No/denies pain   Therapy/Group: Individual Therapy  [name] L 12/20/2018, 7:04 AM

## 2018-12-21 ENCOUNTER — Inpatient Hospital Stay (HOSPITAL_COMMUNITY): Payer: Medicare Other | Admitting: Occupational Therapy

## 2018-12-21 LAB — CBC WITH DIFFERENTIAL/PLATELET
Abs Immature Granulocytes: 0.03 10*3/uL (ref 0.00–0.07)
Basophils Absolute: 0 10*3/uL (ref 0.0–0.1)
Basophils Relative: 0 %
Eosinophils Absolute: 0 10*3/uL (ref 0.0–0.5)
Eosinophils Relative: 0 %
HCT: 32.9 % — ABNORMAL LOW (ref 39.0–52.0)
Hemoglobin: 11.5 g/dL — ABNORMAL LOW (ref 13.0–17.0)
Immature Granulocytes: 1 %
Lymphocytes Relative: 8 %
Lymphs Abs: 0.4 10*3/uL — ABNORMAL LOW (ref 0.7–4.0)
MCH: 32.8 pg (ref 26.0–34.0)
MCHC: 35 g/dL (ref 30.0–36.0)
MCV: 93.7 fL (ref 80.0–100.0)
Monocytes Absolute: 0.5 10*3/uL (ref 0.1–1.0)
Monocytes Relative: 11 %
Neutro Abs: 3.6 10*3/uL (ref 1.7–7.7)
Neutrophils Relative %: 80 %
Platelets: 164 10*3/uL (ref 150–400)
RBC: 3.51 MIL/uL — ABNORMAL LOW (ref 4.22–5.81)
RDW: 14.3 % (ref 11.5–15.5)
WBC: 4.5 10*3/uL (ref 4.0–10.5)
nRBC: 2.4 % — ABNORMAL HIGH (ref 0.0–0.2)

## 2018-12-21 LAB — CULTURE, BLOOD (ROUTINE X 2)
Culture: NO GROWTH
Culture: NO GROWTH
Special Requests: ADEQUATE

## 2018-12-21 LAB — URINALYSIS, ROUTINE W REFLEX MICROSCOPIC
Bilirubin Urine: NEGATIVE
Glucose, UA: 50 mg/dL — AB
Ketones, ur: NEGATIVE mg/dL
Leukocytes,Ua: NEGATIVE
Nitrite: NEGATIVE
Protein, ur: 100 mg/dL — AB
Specific Gravity, Urine: 1.012 (ref 1.005–1.030)
pH: 5 (ref 5.0–8.0)

## 2018-12-21 LAB — GLUCOSE, CAPILLARY
Glucose-Capillary: 118 mg/dL — ABNORMAL HIGH (ref 70–99)
Glucose-Capillary: 68 mg/dL — ABNORMAL LOW (ref 70–99)
Glucose-Capillary: 80 mg/dL (ref 70–99)
Glucose-Capillary: 214 mg/dL — ABNORMAL HIGH (ref 70–99)
Glucose-Capillary: 259 mg/dL — ABNORMAL HIGH (ref 70–99)

## 2018-12-21 NOTE — Progress Notes (Signed)
Occupational Therapy Session Note  Patient Details  Name: David [name]. MRN: [id number] Date of Birth: 1940/10/02  Today's Date: 12/21/2018 OT Individual Time: 2440-1027 OT Individual Time Calculation (min): 57 min   Short Term Goals: Week 1:  OT Short Term Goal 1 (Week 1): Pt will be able to sit to stand with min A to prepare for clothing management with LB self care and toileting. OT Short Term Goal 2 (Week 1): Pt will be able to stand with min A to adjust clothing over hips. OT Short Term Goal 3 (Week 1): Pt will be able to don shirt with set up. OT Short Term Goal 4 (Week 1): Pt will be able to don pants with mod A. OT Short Term Goal 5 (Week 1): Pt will be able to transfer to the toilet with min A.  Skilled Therapeutic Interventions/Progress Updates:    Pt greeted in bed and agreeable to tx. Started with ambulatory toilet transfer to the restroom. Pt already had his gripper socks donned, but also needed to don slippers due to foot discomfort secondary to gout. Steady assist for transfer with sit<stand from elevated bed. Pt completed perihygiene while sitting post B+B void. He then engaged in bathing/dressing tasks sit<stand at the sink. Close supervision for dynamic standing balance with vcs for perihygiene thoroughness. Min vcs for sequencing and safety overall including locking w/c brakes at appropriate times. Note pt is impulsive with bending forward to retrieve items from floor (while seated), however no LOBs during session. OT donned Teds. When he finished dressing, pt opted to complete oral care and hair combing sitting vs standing due to fatigue. Min vcs for locating needed items, but setup assist otherwise. He them ambulated with RW and steady assist to bedside recliner. Left him with all needs within reach and chair alarm set. Tx focus placed on ADL retraining, dynamic balance, functional ambulation with device, and activity tolerance.   Therapy Documentation Precautions:   Precautions Precautions: Fall Restrictions Weight Bearing Restrictions: No Other Position/Activity Restrictions: B foot pain from gout/cellulitis Vital Signs: Therapy Vitals Temp: 98.4 F (36.9 C) Temp Source: Oral Pulse Rate: 75 Resp: 18 BP: 109/74 Patient Position (if appropriate): Lying Oxygen Therapy SpO2: 98 % O2 Device: Room Air Pain: He reported he's been experiencing jaw pain for the last few days, but stated during session "it's not bothering me right now."  Pain Assessment Pain Scale: 0-10 Pain Score: 5  Pain Type: Acute pain Pain Location: Jaw Pain Orientation: Right Pain Descriptors / Indicators: Aching Pain Frequency: Rarely Patients Stated Pain Goal: 2 Pain Intervention(s): Medication (See eMAR) ADL: ADL Eating: Set up Grooming: Supervision/safety Where Assessed-Grooming: Sitting at sink Upper Body Bathing: Supervision/safety Where Assessed-Upper Body Bathing: Sitting at sink Lower Body Bathing: Maximal assistance Where Assessed-Lower Body Bathing: Sitting at sink, Standing at sink Upper Body Dressing: Minimal assistance Where Assessed-Upper Body Dressing: Sitting at sink Lower Body Dressing: Dependent Where Assessed-Lower Body Dressing: Standing at sink, Sitting at sink Toileting: Dependent Where Assessed-Toileting: Glass blower/designer: Moderate assistance Toilet Transfer Method: Stand pivot Toilet Transfer Equipment: Grab bars      Therapy/Group: Individual Therapy  David [name] A David [name] 12/21/2018, 4:06 PM

## 2018-12-21 NOTE — Progress Notes (Signed)
[facility] PHYSICAL MEDICINE & REHABILITATION PROGRESS NOTE   Subjective/Complaints:   No issues overnite  ROS Neg CP, SOB N/V/D   Objective:   No results found. Recent Labs    12/19/18 0521  WBC 6.2  HGB 11.7*  HCT 34.0*  PLT 130*   Recent Labs    12/19/18 0521  NA 135  K 4.4  CL 104  CO2 20*  GLUCOSE 123*  BUN 63*  CREATININE 1.67*  CALCIUM 8.6*    Intake/Output Summary (Last 24 hours) at 12/21/2018 0805 Last data filed at 12/21/2018 0700 Gross per 24 hour  Intake 600 ml  Output 1950 ml  Net -1350 ml     Physical Exam: Vital Signs Blood pressure 117/74, pulse 78, temperature (!) 97.4 F (36.3 C), temperature source Oral, resp. rate 16, height 5\' 9"  (1.753 m), weight 92.2 kg, SpO2 98 %.  Neurological:  Reviewed vitals, nursing notes and labs. Pt awake, alert, appropriate, Ox2-3 sitting up in manual w/c in room, PT at side, NAD; much more comfortable than yesterday- c/o NO pain. Mood and affect are appropriate Heart: Regular rate and rhythm  Lungs: Clear to auscultation B/L Abdomen: Positive bowel sounds, soft, NT, ND Extremities: No clubbing, cyanosis, or edema Skin: No evidence of breakdown, no evidence of rash, not even on face, where was complaining of pain Neurologic:  motor strength is 5/5 in bilateral deltoid, bicep, tricep, grip, 4/5 Bilateral hip flexor, knee extensors, ankle dorsiflexor and plantar flexor Sensory exam normal sensation to light touch and proprioception  bilateral upper and lower extremities No skin breakdown, rash on face/no swelling on R lower jaw or that could see inside mouth that would signal a tooth needs attention.   Assessment/Plan: 1. Functional deficits secondary to Debility with recent cellulitis and gout flare which require 3+ hours per day of interdisciplinary therapy in a comprehensive inpatient rehab setting.  Physiatrist is providing close team supervision and 24 hour management of active medical problems  listed below.  Physiatrist and rehab team continue to assess barriers to discharge/monitor patient progress toward functional and medical goals  Care Tool:  Bathing    Body parts bathed by patient: Right arm, Left arm, Chest, Abdomen, Front perineal area, Right upper leg, Left upper leg, Face, Buttocks   Body parts bathed by helper: Right lower leg, Left lower leg     Bathing assist Assist Level: Minimal Assistance - Patient > 75%     Upper Body Dressing/Undressing Upper body dressing   What is the patient wearing?: Pull over shirt, Button up shirt    Upper body assist Assist Level: Supervision/Verbal cueing    Lower Body Dressing/Undressing Lower body dressing      What is the patient wearing?: Pants, Underwear/pull up     Lower body assist Assist for lower body dressing: Minimal Assistance - Patient > 75%     Toileting Toileting    Toileting assist Assist for toileting: Minimal Assistance - Patient > 75%     Transfers Chair/bed transfer  Transfers assist     Chair/bed transfer assist level: Minimal Assistance - Patient > 75%     Locomotion Ambulation   Ambulation assist      Assist level: Minimal Assistance - Patient > 75% Assistive device: Walker-rolling Max distance: 15'   Walk 10 feet activity   Assist     Assist level: Minimal Assistance - Patient > 75% Assistive device: Walker-rolling   Walk 50 feet activity   Assist Walk 50 feet with 2  turns activity did not occur: Safety/medical concerns         Walk 150 feet activity   Assist Walk 150 feet activity did not occur: Safety/medical concerns         Walk 10 feet on uneven surface  activity   Assist Walk 10 feet on uneven surfaces activity did not occur: Safety/medical concerns         Wheelchair     Assist Will patient use wheelchair at discharge?: Yes Type of Wheelchair: Manual    Wheelchair assist level: Minimal Assistance - Patient > 75% Max wheelchair  distance: 150'    Wheelchair 50 feet with 2 turns activity    Assist    Wheelchair 50 feet with 2 turns activity did not occur: Safety/medical concerns   Assist Level: Minimal Assistance - Patient > 75%   Wheelchair 150 feet activity     Assist  Wheelchair 150 feet activity did not occur: Safety/medical concerns   Assist Level: Minimal Assistance - Patient > 75%   Blood pressure 117/74, pulse 78, temperature (!) 97.4 F (36.3 C), temperature source Oral, resp. rate 16, height 5\' 9"  (1.753 m), weight 92.2 kg, SpO2 98 %.  Medical Problem List and Plan: 1.Debilitysecondary to generalized weakness related to recent cellulitis with gout flareup as well as history of malignant melanoma with metastasis to the brain Cont CIR PT, OT, SLP  2. Antithrombotics: -DVT/anticoagulation:Lovenox -antiplatelet therapy: Aspirin 81 mg daily 3. Pain Management:Tylenol as needed 4. Mood:Celexa 10 mg daily. Provide emotional support -antipsychotic agents: N/A 5. Neuropsych: This patientis ? capable of making decisions on hisown behalf. 6. Skin/Wound Care:Routine skin checks 7. Fluids/Electrolytes/Nutrition:Routine in and outs with follow-up chemistries 8. Left foot pain and swelling related to acute gout. Complete prednisone taper 9. 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 Status post craniotomy 2017 with chemotherapy as well as radiation. Followed by Dr. Burr Medico as well as Dr. Mickeal Skinner 10. Leukocytosis as well as history of pancytopenia. Likely secondary to recent Granix injection. Urinalysis study no growth  9/10- WBC down to 6.2k- will recheck weekly 11. Diabetes mellitus. Hemoglobin A1c 7.4. Low-dose Glucotrol 2.5 mg daily.  CBG (last 3)  Recent Labs    12/20/18 1201 12/20/18 1648 12/21/18 0637  GLUCAP 125* 248* 118*  some lability  , monitor for pattern 12. Hypertension. Norvasc 5 mg daily, Toprol 75 mg twice daily. Monitor with increased mobility Vitals:   12/20/18 1952 12/21/18 0446  BP: 123/69 117/74  Pulse: 77 78  Resp: 16 16  Temp: 98.4 F (36.9 C) (!) 97.4 F (36.3 C)  SpO2: 97% 98%  controlled no brady  13. AKI superimposed on CKD stage III. Creatinine stabilizing after gentle IV fluids. Follow-up chemistries  9/10- Cr down to 1.67 and Bun down to 63 from 70  9/11- will recheck Monday 14. CAD with CABG. Continue aspirin therapy no chest pain 15. Hypothyroidism. Synthroid 16. Hyperlipidemia. Crestor  17. Toothache  9/10- pt was emphatic tylenol not helpful, nor tramadol (in past)- wants something stronger- spoke to pt's daughter, MD here at [facility] with Norco prn- ordered  9/11- didn't localize to 1 tooth, but feeling much better.   LOS: 3 days A FACE TO FACE EVALUATION WAS PERFORMED  Charlett Blake 12/21/2018, 8:05 AM

## 2018-12-21 NOTE — Progress Notes (Signed)
Hypoglycemic Event  CBG: 68  Treatment: gingerale  Symptoms: none  Follow-up CBG: Time:**1222 CBG Result:80  Possible Reasons for Event: unknown  Comments/MD notified:Dr Kirsteins    Kaitlinn Iversen, Lawana Pai

## 2018-12-21 NOTE — Significant Event (Signed)
Pt's MEWS is 2 (yellow); with oral temp of 101.5. Pt alert and oriented to person, place, and year. Patient complained of 5/10 generalized pain and toothache in R jaw. Noted redness, swelling, and pain of 2nd and 3rd knuckles of L hand and L ankle. Charge RN made aware. Dr. Letta Pate informed with orders. RRT nurse made aware of MEWS per protocol. Labs drawns and UA/culture sent. Repeat vital signs WNL with MEWS of 0. Will monitor.

## 2018-12-21 NOTE — Significant Event (Signed)
Rapid Response Event Note  Overview:    Called d/t MEWS-2. T-101.5. Tylenol given and MD notified by bedside RN. Blood cultures, U/A/culture ordered.  Call RRT if assistance needed. Dillard Essex

## 2018-12-21 NOTE — Progress Notes (Signed)
Patient refused CPAP tonight 

## 2018-12-22 ENCOUNTER — Inpatient Hospital Stay (HOSPITAL_COMMUNITY): Payer: Medicare Other

## 2018-12-22 ENCOUNTER — Inpatient Hospital Stay (HOSPITAL_COMMUNITY): Payer: Medicare Other | Admitting: Physical Therapy

## 2018-12-22 LAB — GLUCOSE, CAPILLARY
Glucose-Capillary: 139 mg/dL — ABNORMAL HIGH (ref 70–99)
Glucose-Capillary: 126 mg/dL — ABNORMAL HIGH (ref 70–99)
Glucose-Capillary: 85 mg/dL (ref 70–99)
Glucose-Capillary: 236 mg/dL — ABNORMAL HIGH (ref 70–99)
Glucose-Capillary: 325 mg/dL — ABNORMAL HIGH (ref 70–99)

## 2018-12-22 MED ORDER — INSULIN ASPART 100 UNIT/ML ~~LOC~~ SOLN
5.0000 [IU] | Freq: Once | SUBCUTANEOUS | Status: AC
  Administered 2018-12-22: 5 [IU] via SUBCUTANEOUS

## 2018-12-22 MED ORDER — PREDNISONE 20 MG PO TABS
20.0000 mg | ORAL_TABLET | Freq: Every day | ORAL | Status: DC
  Administered 2018-12-22 – 2018-12-23 (×2): 20 mg via ORAL
  Filled 2018-12-22 (×2): qty 1

## 2018-12-22 MED ORDER — GLIPIZIDE 2.5 MG HALF TABLET
2.5000 mg | ORAL_TABLET | Freq: Two times a day (BID) | ORAL | Status: DC
  Administered 2018-12-22 – 2018-12-26 (×8): 2.5 mg via ORAL
  Filled 2018-12-22 (×8): qty 1

## 2018-12-22 MED ORDER — METHYLPHENIDATE HCL 5 MG PO TABS
5.0000 mg | ORAL_TABLET | Freq: Two times a day (BID) | ORAL | Status: DC
  Administered 2018-12-23 – 2018-12-28 (×11): 5 mg via ORAL
  Filled 2018-12-22 (×11): qty 1

## 2018-12-22 NOTE — Progress Notes (Signed)
Occupational Therapy Session Note  Patient Details  Name: David [name]. MRN: [id number] Date of Birth: [birth year]  Today's Date: 12/22/2018 OT Individual Time: 0700-0810 OT Individual Time Calculation (min): 70 min    Short Term Goals: Week 1:  OT Short Term Goal 1 (Week 1): Pt will be able to sit to stand with min A to prepare for clothing management with LB self care and toileting. OT Short Term Goal 2 (Week 1): Pt will be able to stand with min A to adjust clothing over hips. OT Short Term Goal 3 (Week 1): Pt will be able to don shirt with set up. OT Short Term Goal 4 (Week 1): Pt will be able to don pants with mod A. OT Short Term Goal 5 (Week 1): Pt will be able to transfer to the toilet with min A.  Skilled Therapeutic Interventions/Progress Updates:    Pt resting in bed upon arrival.  Pt requested to eat breakfast prior to getting OOB and bathing/dressing.  Continued discharge planning/education while pt eating breakfast.  Pt declined shower this morning.  Pt completed bathing/dressing tasks with sit<>stand from w/c at sink. Occasional CGA for sit<>stand.  Pt amb with RW to sink for bathing/dressing tasks.  Pt slow to process when questioned or asked to complete specific task.  Pt requires more than a reasonable amount of time to initiate and complete tasks.  Pt declined use of toilet this morning.  Sit<>stand X 5.  Pt stood at sink to brush teeth.  Pt remained in w/c with all needs within reach and belt alarm activated. RN present.   Therapy Documentation Precautions:  Precautions Precautions: Fall Restrictions Weight Bearing Restrictions: No Other Position/Activity Restrictions: B foot pain from gout/cellulitis Pain:  Pt states his pain is "medium" in R foot (tenderness noted on lateral aspect near 5th digit); MD aware  Therapy/Group: Individual Therapy  Leroy Libman 12/22/2018, 8:13 AM

## 2018-12-22 NOTE — Progress Notes (Signed)
Physical Therapy Session Note  Patient Details  Name: David [name]. MRN: [id number] Date of Birth: 12/02/1940  Today's Date: 12/22/2018 PT Individual Time: 1430-1500 PT Individual Time Calculation (min): 30 min   Short Term Goals: Week 1:  PT Short Term Goal 1 (Week 1): Pt will perform sit<>stand at min A level with mod cues for technique. PT Short Term Goal 2 (Week 1): Pt will propel w/c with BUEs and/or LEs x 50' at S level. PT Short Term Goal 3 (Week 1): Pt will ambulate x 50' with RW at min A level  Skilled Therapeutic Interventions/Progress Updates:  Pt was seen bedside in the pm, using the bathroom with nurse tech. Pt transferred sit to stand with rolling walker and min A. Pt ambulated from toilet to edge of bed with rolling walker and min A. Pt c/o pain all over and not feeling well. Pt willing to try and participate with therapy. Pt transported to rehab gym. Pt performed all transfers with rolling walker and min A with increased time. Pt ambulated 25 feet x 2 with rolling walker and min A with verbal cues. Pt returned to room secondary to fatigue and not feeling well. Pt transferred w/c to edge of bed with rolling walker and min A. Pt transferred edge of bed to supine with S and verbal cues. Pt left sitting up in bed with call bell within reach and bed alarm on.   Therapy Documentation Precautions:  Precautions Precautions: Fall Restrictions Weight Bearing Restrictions: No Other Position/Activity Restrictions: B foot pain from gout/cellulitis General: PT Amount of Missed Time (min): 30 Minutes PT Missed Treatment Reason: Patient fatigue;Pain Pain: Pt c/o 6/10 pain B feet and L hand, hx gout.  Therapy/Group: Individual Therapy  Dub Amis 12/22/2018, 3:07 PM

## 2018-12-22 NOTE — Progress Notes (Signed)
Patient refused CPAP at this time.

## 2018-12-22 NOTE — Progress Notes (Signed)
Occupational Therapy Session Note  Patient Details  Name: David [name]. MRN: [id number] Date of Birth: December 08, 1940  Today's Date: 12/22/2018 OT Individual Time: 1105-1200 OT Individual Time Calculation (min): 55 min    Short Term Goals: Week 1:  OT Short Term Goal 1 (Week 1): Pt will be able to sit to stand with min A to prepare for clothing management with LB self care and toileting. OT Short Term Goal 2 (Week 1): Pt will be able to stand with min A to adjust clothing over hips. OT Short Term Goal 3 (Week 1): Pt will be able to don shirt with set up. OT Short Term Goal 4 (Week 1): Pt will be able to don pants with mod A. OT Short Term Goal 5 (Week 1): Pt will be able to transfer to the toilet with min A.  Skilled Therapeutic Interventions/Progress Updates:    Pt resting in bed upon arrival but agreeable to therapy.  Pt requested use of toilet and amb with RW to bathroom with CGA. Pt completed toileting tasks with CGA when standing to pull pants over hips.  Pt very slow with amb 2/2 increased B foot pain when standing/walking. OT intervention with focus on sit<>stand, standing balance, activity tolerance and safety awareness to increase independence with BADLs.  Bed mobility-supervision Amb with RW-CGA/supervision Sit<>stand from varying surface heights-supervision/CGA at lower heights Sit<>stand without BUE support/assistance-Min A  Standing tasks while reaching with BUE-supervision  Pt fatigues quickly and requires multiple rest breaks. Pt commented that he wished he didn't get so tired. Pt returned to room and requested to lay back into bed. Supervision for sit>supine.  Pt remained in bed with all needs within reach and bed alarm actiated.  NT present.   Therapy Documentation Precautions:  Precautions Precautions: Fall Restrictions Weight Bearing Restrictions: No Other Position/Activity Restrictions: B foot pain from gout/cellulitis   Pain: Pt c/o BLE (foot) pain when  ambulating; repositioned Therapy/Group: Individual Therapy  Leroy Libman 12/22/2018, 12:07 PM

## 2018-12-22 NOTE — Progress Notes (Addendum)
[place] PHYSICAL MEDICINE & REHABILITATION PROGRESS NOTE   Subjective/Complaints:   Reviewed flow sheet, one temp of 101.5 and another 99.9.  , poor historian due to cognitive deficits but is c/o numbness in feet.  C/o Right lower jaw pain no blood in slaiva no problem eating.  Nsg notes toe swelling on RIgh tside   ROS Neg CP, SOB N/V/D   Objective:   No results found. Recent Labs    12/21/18 2200  WBC 4.5  HGB 11.5*  HCT 32.9*  PLT 164   No results for input(s): NA, K, CL, CO2, GLUCOSE, BUN, CREATININE, CALCIUM in the last 72 hours.  Intake/Output Summary (Last 24 hours) at 12/22/2018 0740 Last data filed at 12/22/2018 0631 Gross per 24 hour  Intake 960 ml  Output 2200 ml  Net -1240 ml     Physical Exam: Vital Signs Blood pressure 108/74, pulse 78, temperature 98.1 F (36.7 C), temperature source Oral, resp. rate 18, height 5\' 9"  (1.753 m), weight 92.2 kg, SpO2 94 %.  Neurological:  Reviewed vitals, nursing notes and labs. Pt awake, alert, appropriate, Ox2-3 sitting up in manual w/c in room, PT at side, NAD; much more comfortable than yesterday- c/o NO pain. Mood and affect are appropriate Heart: Regular rate and rhythm  Lungs: Clear to auscultation B/L Abdomen: Positive bowel sounds, soft, NT, ND Extremities:edema and tenderness RIght 5th MTP    Skin: No evidence of breakdown, no evidence of rash, not even on face, where was complaining of pain Neurologic:  motor strength is 5/5 in bilateral deltoid, bicep, tricep, grip, 4/5 Bilateral hip flexor, knee extensors, ankle dorsiflexor and plantar flexor Sensory exam normal sensation to light touch and proprioception  bilateral upper and lower extremities No skin breakdown, rash on face/no  Oral cavity- no right lower [place] periodontal swelling, no gingivitis, no bleeding along alveolar ridge, no tenderness to palp along molars  Right lower jaw   Assessment/Plan: 1. Functional deficits secondary to Debility  with recent cellulitis and gout flare which require 3+ hours per day of interdisciplinary therapy in a comprehensive inpatient rehab setting.  Physiatrist is providing close team supervision and 24 hour management of active medical problems listed below.  Physiatrist and rehab team continue to assess barriers to discharge/monitor patient progress toward functional and medical goals  Care Tool:  Bathing    Body parts bathed by patient: Right arm, Left arm, Chest, Abdomen, Front perineal area, Right upper leg, Left upper leg, Face, Buttocks, Right lower leg, Left lower leg   Body parts bathed by helper: Right lower leg, Left lower leg     Bathing assist Assist Level: Supervision/Verbal cueing     Upper Body Dressing/Undressing Upper body dressing   What is the patient wearing?: Pull over shirt, Button up shirt    Upper body assist Assist Level: Set up assist    Lower Body Dressing/Undressing Lower body dressing      What is the patient wearing?: Pants, Underwear/pull up     Lower body assist Assist for lower body dressing: Supervision/Verbal cueing     Toileting Toileting    Toileting assist Assist for toileting: Moderate Assistance - Patient 50 - 74%     Transfers Chair/bed transfer  Transfers assist     Chair/bed transfer assist level: Minimal Assistance - Patient > 75%     Locomotion Ambulation   Ambulation assist      Assist level: Minimal Assistance - Patient > 75% Assistive device: Walker-rolling Max distance: 43'  Walk 10 feet activity   Assist     Assist level: Minimal Assistance - Patient > 75% Assistive device: Walker-rolling   Walk 50 feet activity   Assist Walk 50 feet with 2 turns activity did not occur: Safety/medical concerns         Walk 150 feet activity   Assist Walk 150 feet activity did not occur: Safety/medical concerns         Walk 10 feet on uneven surface  activity   Assist Walk 10 feet on uneven  surfaces activity did not occur: Safety/medical concerns         Wheelchair     Assist Will patient use wheelchair at discharge?: Yes Type of Wheelchair: Manual    Wheelchair assist level: Minimal Assistance - Patient > 75% Max wheelchair distance: 150'    Wheelchair 50 feet with 2 turns activity    Assist    Wheelchair 50 feet with 2 turns activity did not occur: Safety/medical concerns   Assist Level: Minimal Assistance - Patient > 75%   Wheelchair 150 feet activity     Assist  Wheelchair 150 feet activity did not occur: Safety/medical concerns   Assist Level: Minimal Assistance - Patient > 75%   Blood pressure 108/74, pulse 78, temperature 98.1 F (36.7 C), temperature source Oral, resp. rate 18, height 5\' 9"  (1.753 m), weight 92.2 kg, SpO2 94 %.  Medical Problem List and Plan: 1.Debilitysecondary to generalized weakness related to recent cellulitis with gout flareup as well as history of malignant melanoma with metastasis to the brain Cont CIR PT, OT, SLP  2. Antithrombotics: -DVT/anticoagulation:Lovenox -antiplatelet therapy: Aspirin 81 mg daily 3. Pain Management:Tylenol as needed 4. Mood:Celexa 10 mg daily. Provide emotional support -antipsychotic agents: N/A 5. Neuropsych: This patientis ? capable of making decisions on hisown behalf. 6. Skin/Wound Care:Routine skin checks 7. Fluids/Electrolytes/Nutrition:Routine in and outs with follow-up chemistries 8.t foot pain and swelling related to recurrent  gout. restart prednisone 20mg  per day x 5 d 9. 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 Status post craniotomy 2017 with chemotherapy as well as radiation. Followed by Dr. Burr Medico as well as Dr. Mickeal Skinner 10. Leukocytosis resolved 11. Diabetes mellitus. Hemoglobin A1c 7.4. increase Glucotrol 2.5 mg BID  CBG  (last 3)  Recent Labs    12/21/18 2105 12/22/18 0518 12/22/18 0625  GLUCAP 259* 139* 126*  some lability , monitor for pattern- expect increase with prednisone  12. Hypertension. Norvasc 5 mg daily, Toprol 75 mg twice daily. Monitor with increased mobility Vitals:   12/22/18 0307 12/22/18 0632  BP: 97/72 108/74  Pulse: 76 78  Resp: 18   Temp: 98.1 F (36.7 C)   SpO2: 94%   controlled no brady  13. AKI superimposed on CKD stage III. Creatinine stabilizing after gentle IV fluids. Follow-up chemistries  9/10- Cr down to 1.67 and Bun down to 63 from 70  9/11- will recheck Monday 14. CAD with CABG. Continue aspirin therapy no chest pain 15. Hypothyroidism. Synthroid 16. Hyperlipidemia. Crestor  17. Toothache  Exam negative and infact pt not complaining this am  18.  Elevated temp x1 UA neg, WBC normal, BC sent ? Atelectasis due to bedrest vs gout related order IS  Discussed several family concerns with daughter Dr [name] Gout- flared up again, she is wondering if this can be prevented, we discussed this should not be done during an acute flare, renal impairment  may complicate treatment with meds such as allopurinol or uloric  Mood- off testosterone x 1 mo due to prostate CA, may be playing into his reduced affect  Poor concentration- neuro onc had pt on prn ritalin for concentration   ritalin ordered  for the above and it may help with affect as well  Put in consult for neuropsych as well  Dental pain- intermittent ,dental consult ordered per family request, daughter mentioned prior hx of trigeminal neuralgia on opposite side (left) , no facial skin rash noted, pt does have beard  LOS: 4 days A FACE TO FACE EVALUATION WAS PERFORMED  Charlett Blake 12/22/2018, 7:40 AM

## 2018-12-22 NOTE — Plan of Care (Signed)
  Problem: Consults Goal: RH GENERAL PATIENT EDUCATION Description: See Patient Education module for education specifics. Outcome: Progressing Goal: Diabetes Guidelines if Diabetic/Glucose > 140 Description: If diabetic or lab glucose is > 140 mg/dl - Initiate Diabetes/Hyperglycemia Guidelines & Document Interventions  Outcome: Progressing   Problem: RH BOWEL ELIMINATION Goal: RH STG MANAGE BOWEL WITH ASSISTANCE Description: STG Manage Bowel with mod I Assistance. Outcome: Progressing   Problem: RH BLADDER ELIMINATION Goal: RH STG MANAGE BLADDER WITH ASSISTANCE Description: STG Manage Bladder With mod I.Assistance Outcome: Progressing   Problem: RH SKIN INTEGRITY Goal: RH STG SKIN FREE OF INFECTION/BREAKDOWN Description: With mod I Assist. Outcome: Progressing Goal: RH STG MAINTAIN SKIN INTEGRITY WITH ASSISTANCE Description: STG Maintain Skin Integrity With mod I Assistance. Outcome: Progressing   Problem: RH SAFETY Goal: RH STG ADHERE TO SAFETY PRECAUTIONS W/ASSISTANCE/DEVICE Description: STG Adhere to Safety Precautions With mod I Assistance/Device. Outcome: Progressing   Problem: RH PAIN MANAGEMENT Goal: RH STG PAIN MANAGED AT OR BELOW PT'S PAIN GOAL Description: Less than 3. Outcome: Progressing

## 2018-12-23 ENCOUNTER — Ambulatory Visit: Payer: Medicare Other

## 2018-12-23 ENCOUNTER — Inpatient Hospital Stay (HOSPITAL_COMMUNITY): Payer: Medicare Other

## 2018-12-23 ENCOUNTER — Inpatient Hospital Stay (HOSPITAL_COMMUNITY): Payer: Medicare Other | Admitting: Occupational Therapy

## 2018-12-23 LAB — CBC WITH DIFFERENTIAL/PLATELET
Abs Immature Granulocytes: 0.17 10*3/uL — ABNORMAL HIGH (ref 0.00–0.07)
Basophils Absolute: 0 10*3/uL (ref 0.0–0.1)
Basophils Relative: 0 %
Eosinophils Absolute: 0 10*3/uL (ref 0.0–0.5)
Eosinophils Relative: 0 %
HCT: 28.6 % — ABNORMAL LOW (ref 39.0–52.0)
Hemoglobin: 9.9 g/dL — ABNORMAL LOW (ref 13.0–17.0)
Immature Granulocytes: 7 %
Lymphocytes Relative: 16 %
Lymphs Abs: 0.4 10*3/uL — ABNORMAL LOW (ref 0.7–4.0)
MCH: 32.8 pg (ref 26.0–34.0)
MCHC: 34.6 g/dL (ref 30.0–36.0)
MCV: 94.7 fL (ref 80.0–100.0)
Monocytes Absolute: 0.3 10*3/uL (ref 0.1–1.0)
Monocytes Relative: 12 %
Neutro Abs: 1.6 10*3/uL — ABNORMAL LOW (ref 1.7–7.7)
Neutrophils Relative %: 65 %
Platelets: 149 10*3/uL — ABNORMAL LOW (ref 150–400)
RBC: 3.02 MIL/uL — ABNORMAL LOW (ref 4.22–5.81)
RDW: 14.5 % (ref 11.5–15.5)
WBC: 2.4 10*3/uL — ABNORMAL LOW (ref 4.0–10.5)
nRBC: 0 % (ref 0.0–0.2)

## 2018-12-23 LAB — GLUCOSE, CAPILLARY
Glucose-Capillary: 166 mg/dL — ABNORMAL HIGH (ref 70–99)
Glucose-Capillary: 152 mg/dL — ABNORMAL HIGH (ref 70–99)
Glucose-Capillary: 291 mg/dL — ABNORMAL HIGH (ref 70–99)
Glucose-Capillary: 271 mg/dL — ABNORMAL HIGH (ref 70–99)

## 2018-12-23 LAB — URINE CULTURE: Culture: NO GROWTH

## 2018-12-23 LAB — COMPREHENSIVE METABOLIC PANEL
ALT: 47 U/L — ABNORMAL HIGH (ref 0–44)
AST: 20 U/L (ref 15–41)
Albumin: 2.3 g/dL — ABNORMAL LOW (ref 3.5–5.0)
Alkaline Phosphatase: 44 U/L (ref 38–126)
Anion gap: 10 (ref 5–15)
BUN: 74 mg/dL — ABNORMAL HIGH (ref 8–23)
CO2: 21 mmol/L — ABNORMAL LOW (ref 22–32)
Calcium: 8.6 mg/dL — ABNORMAL LOW (ref 8.9–10.3)
Chloride: 103 mmol/L (ref 98–111)
Creatinine, Ser: 1.96 mg/dL — ABNORMAL HIGH (ref 0.61–1.24)
GFR calc Af Amer: 37 mL/min — ABNORMAL LOW (ref >60)
GFR calc non Af Amer: 32 mL/min — ABNORMAL LOW (ref >60)
Glucose, Bld: 165 mg/dL — ABNORMAL HIGH (ref 70–99)
Potassium: 4.8 mmol/L (ref 3.5–5.1)
Sodium: 134 mmol/L — ABNORMAL LOW (ref 135–145)
Total Bilirubin: 0.3 mg/dL (ref 0.3–1.2)
Total Protein: 5.3 g/dL — ABNORMAL LOW (ref 6.5–8.1)

## 2018-12-23 MED ORDER — PREDNISONE 20 MG PO TABS
40.0000 mg | ORAL_TABLET | Freq: Every day | ORAL | Status: AC
  Administered 2018-12-26 – 2018-12-28 (×3): 40 mg via ORAL
  Filled 2018-12-23 (×2): qty 2
  Filled 2018-12-23: qty 4

## 2018-12-23 MED ORDER — PREDNISONE 20 MG PO TABS: 50.0000 mg | ORAL_TABLET | Freq: Every day | ORAL | Status: DC

## 2018-12-23 MED ORDER — PREDNISONE 20 MG PO TABS: 40.0000 mg | ORAL_TABLET | Freq: Every day | ORAL | Status: DC

## 2018-12-23 MED ORDER — PREDNISONE 20 MG PO TABS: 20.0000 mg | ORAL_TABLET | Freq: Every day | ORAL | Status: DC

## 2018-12-23 MED ORDER — PREDNISONE 20 MG PO TABS
30.0000 mg | ORAL_TABLET | Freq: Once | ORAL | Status: AC
  Administered 2018-12-23: 30 mg via ORAL
  Filled 2018-12-23: qty 1

## 2018-12-23 MED ORDER — PREDNISONE 20 MG PO TABS
50.0000 mg | ORAL_TABLET | Freq: Every day | ORAL | Status: AC
  Administered 2018-12-24 – 2018-12-25 (×2): 50 mg via ORAL
  Filled 2018-12-23 (×2): qty 2

## 2018-12-23 MED ORDER — PREDNISONE 10 MG PO TABS: 10.0000 mg | ORAL_TABLET | Freq: Every day | ORAL | Status: DC

## 2018-12-23 NOTE — Progress Notes (Addendum)
[place] PHYSICAL MEDICINE & REHABILITATION PROGRESS NOTE   Subjective/Complaints:   Pt reports R jaw still hurts, but not as bad- maybe 4-5/10- is "better". However gout is really painful- in B/L feet and L hand, per pt- was able to describe the pain in detail- aching/throbbing, not stabbing pain.  Discussed pt with daughter, Dr [name] at length today- x 30 minutes on phone.  Discussed possible palliative consult- decided to wait until pt gets home to see if gets more towards baseline; daughter will bring in soda stream water- fizzy water for pt- he likes that better than regular water.  Also went over labs with her- she wants to wait on IVFs and push PO fluids and said his low WBC means he's liekly going to get BM biopsy after d/c.   ROS Neg CP, SOB N/V/D   Objective:   Dg Orthopantogram  Result Date: 12/23/2018 CLINICAL DATA:  Acute pulpitis.  Lower right jaw pain EXAM: ORTHOPANTOGRAM/PANORAMIC COMPARISON:  None. FINDINGS: No evidence of periapical abscess or acute bony abnormality. IMPRESSION: No acute findings. Electronically Signed   By: Rolm Baptise M.D.   On: 12/23/2018 09:56   Recent Labs    12/21/18 2200 12/23/18 0633  WBC 4.5 2.4*  HGB 11.5* 9.9*  HCT 32.9* 28.6*  PLT 164 149*   Recent Labs    12/23/18 0633  NA 134*  K 4.8  CL 103  CO2 21*  GLUCOSE 165*  BUN 74*  CREATININE 1.96*  CALCIUM 8.6*    Intake/Output Summary (Last 24 hours) at 12/23/2018 1047 Last data filed at 12/23/2018 0700 Gross per 24 hour  Intake 720 ml  Output 1000 ml  Net -280 ml     Physical Exam: Vital Signs Blood pressure 114/75, pulse 79, temperature 98.6 F (37 C), temperature source Oral, resp. rate 17, height 5\' 9"  (1.753 m), weight 92.2 kg, SpO2 99 %.  Neurological:  Reviewed vitals, nursing notes and labs. Pt awake, alert, appropriate, Ox2-3; sitting up in bed; appears in pain/discomfort; no acute distress. Mood and affect are appropriate Heart: Regular rate and  rhythm  Lungs: Clear to auscultation B/L Abdomen: Positive bowel sounds, soft, NT, ND Extremities:edema and tenderness and significant warmth in 3rd L MCP, and B/L feet, entire foot B/L- red/warm/swollen- c/w gout Neurologic:  motor strength is 5/5 in bilateral deltoid, bicep, tricep, grip, 4/5 Bilateral hip flexor, knee extensors, ankle dorsiflexor and plantar flexor Sensory exam normal sensation to light touch and proprioception  bilateral upper and lower extremities No skin breakdown, rash on face/nose  Oral cavity- no right lower [place] periodontal swelling, no gingivitis, no bleeding along alveolar ridge, no tenderness to palp along molars  Right lower jaw   Assessment/Plan: 1. Functional deficits secondary to Debility with recent cellulitis and gout flare which require 3+ hours per day of interdisciplinary therapy in a comprehensive inpatient rehab setting.  Physiatrist is providing close team supervision and 24 hour management of active medical problems listed below.  Physiatrist and rehab team continue to assess barriers to discharge/monitor patient progress toward functional and medical goals  Care Tool:  Bathing    Body parts bathed by patient: Right arm, Left arm, Chest, Abdomen, Front perineal area, Right upper leg, Left upper leg, Face, Buttocks, Right lower leg, Left lower leg   Body parts bathed by helper: Right lower leg, Left lower leg     Bathing assist Assist Level: Contact Guard/Touching assist     Upper Body Dressing/Undressing Upper body dressing   What  is the patient wearing?: Pull over shirt    Upper body assist Assist Level: Set up assist    Lower Body Dressing/Undressing Lower body dressing      What is the patient wearing?: Underwear/pull up, Pants     Lower body assist Assist for lower body dressing: Supervision/Verbal cueing     Toileting Toileting    Toileting assist Assist for toileting: Moderate Assistance - Patient 50 - 74%      Transfers Chair/bed transfer  Transfers assist     Chair/bed transfer assist level: Minimal Assistance - Patient > 75%     Locomotion Ambulation   Ambulation assist      Assist level: Minimal Assistance - Patient > 75% Assistive device: Walker-rolling Max distance: 25   Walk 10 feet activity   Assist     Assist level: Minimal Assistance - Patient > 75% Assistive device: Walker-rolling   Walk 50 feet activity   Assist Walk 50 feet with 2 turns activity did not occur: Safety/medical concerns         Walk 150 feet activity   Assist Walk 150 feet activity did not occur: Safety/medical concerns         Walk 10 feet on uneven surface  activity   Assist Walk 10 feet on uneven surfaces activity did not occur: Safety/medical concerns         Wheelchair     Assist Will patient use wheelchair at discharge?: Yes Type of Wheelchair: Manual    Wheelchair assist level: Minimal Assistance - Patient > 75% Max wheelchair distance: 150'    Wheelchair 50 feet with 2 turns activity    Assist    Wheelchair 50 feet with 2 turns activity did not occur: Safety/medical concerns   Assist Level: Minimal Assistance - Patient > 75%   Wheelchair 150 feet activity     Assist  Wheelchair 150 feet activity did not occur: Safety/medical concerns   Assist Level: Minimal Assistance - Patient > 75%   Blood pressure 114/75, pulse 79, temperature 98.6 F (37 C), temperature source Oral, resp. rate 17, height 5\' 9"  (1.753 m), weight 92.2 kg, SpO2 99 %.  Medical Problem List and Plan: 1.Debilitysecondary to generalized weakness related to recent cellulitis with gout flareup as well as history of malignant melanoma with metastasis to the brain Cont CIR PT, OT, SLP  2. Antithrombotics: -DVT/anticoagulation:Lovenox -antiplatelet therapy: Aspirin 81 mg daily 3. Pain Management:Tylenol as needed 4. Mood:Celexa 10 mg daily. Provide  emotional support  9/14- added Ritalin yesterday for attention; however will see in next few days if will increase Celexa. -antipsychotic agents: N/A 5. Neuropsych: This patientis ? capable of making decisions on hisown behalf. 6. Skin/Wound Care:Routine skin checks 7. Fluids/Electrolytes/Nutrition:Routine in and outs with follow-up chemistries 8.t foot pain and swelling related to recurrent  gout. restart prednisone 20mg  per day x 5 d 9. 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 Status post craniotomy 2017 with chemotherapy as well as radiation. Followed by Dr. Burr Medico as well as Dr. Mickeal Skinner 10. Leukocytosis resolved 11. Diabetes mellitus. Hemoglobin A1c 7.4. increase Glucotrol 2.5 mg BID  CBG (last 3)  Recent Labs    12/22/18 1657 12/22/18 2111 12/23/18 0622  GLUCAP 236* 325* 166*  some lability , monitor for pattern- expect increase with prednisone  12. Hypertension. Norvasc 5 mg daily, Toprol 75 mg twice daily. Monitor with increased mobility Vitals:   12/22/18 2007  12/23/18 0508  BP: 113/74 114/75  Pulse: 78 79  Resp: 16 17  Temp: 98.9 F (37.2 C) 98.6 F (37 C)  SpO2: 95% 99%  controlled no brady  13. AKI superimposed on CKD stage III. Creatinine stabilizing after gentle IV fluids. Follow-up chemistries  9/10- Cr down to 1.67 and Bun down to 63 from 70  9/11- will recheck Monday  9/14- Cr up to 1.96 (per daughter baseline is 1.5) and BUN up to 74- will push PO fluids- wait on IVFs- will recheck Wednesday. 78. CAD with CABG. Continue aspirin therapy no chest pain 15. Hypothyroidism. Synthroid 16. Hyperlipidemia. Crestor  17. Toothache  Exam negative and infact pt not complaining this am  18.  Elevated temp x1 UA neg, WBC normal, BC sent ? Atelectasis due to bedrest vs gout related order IS 19. Attention- on Ritalin now 20. Gout-    9/14- increased Prednisone to 50 mg day x 3 days then 40 mg day x 3 days then 20 mg x 3 days then 10 mg x3days- a longer taper might impact his BGs, however he and daughter agree that might stop him from continuing to get gout flares.   Dental pain- intermittent ,dental consult ordered per family request, daughter mentioned prior hx of trigeminal neuralgia on opposite side (left) , no facial skin rash noted, pt does have beard  I spent a total of 40 minutes on pt today talking with daughter as documented above as well as seeing pt and going over labs and plan with daughter-   LOS: 5 days A FACE TO FACE EVALUATION WAS PERFORMED  Tyreonna Czaplicki 12/23/2018, 10:47 AM

## 2018-12-23 NOTE — Plan of Care (Signed)
  Problem: Consults Goal: RH GENERAL PATIENT EDUCATION Description: See Patient Education module for education specifics. Outcome: Progressing Goal: Diabetes Guidelines if Diabetic/Glucose > 140 Description: If diabetic or lab glucose is > 140 mg/dl - Initiate Diabetes/Hyperglycemia Guidelines & Document Interventions  Outcome: Progressing   Problem: RH BOWEL ELIMINATION Goal: RH STG MANAGE BOWEL WITH ASSISTANCE Description: STG Manage Bowel with mod I Assistance. Outcome: Progressing   Problem: RH BLADDER ELIMINATION Goal: RH STG MANAGE BLADDER WITH ASSISTANCE Description: STG Manage Bladder With mod I.Assistance Outcome: Progressing   Problem: RH SKIN INTEGRITY Goal: RH STG SKIN FREE OF INFECTION/BREAKDOWN Description: With mod I Assist. Outcome: Progressing Goal: RH STG MAINTAIN SKIN INTEGRITY WITH ASSISTANCE Description: STG Maintain Skin Integrity With mod I Assistance. Outcome: Progressing   Problem: RH SAFETY Goal: RH STG ADHERE TO SAFETY PRECAUTIONS W/ASSISTANCE/DEVICE Description: STG Adhere to Safety Precautions With mod I Assistance/Device. Outcome: Progressing   Problem: RH PAIN MANAGEMENT Goal: RH STG PAIN MANAGED AT OR BELOW PT'S PAIN GOAL Description: Less than 3. Outcome: Progressing

## 2018-12-23 NOTE — Progress Notes (Signed)
Occupational Therapy Session Note  Patient Details  Name: David [name]. MRN: [id number] Date of Birth: June 12, 1940  Today's Date: 12/23/2018 OT Individual Time: 1030-1130 OT Individual Time Calculation (min): 60 min    Short Term Goals: Week 1:  OT Short Term Goal 1 (Week 1): Pt will be able to sit to stand with min A to prepare for clothing management with LB self care and toileting. OT Short Term Goal 2 (Week 1): Pt will be able to stand with min A to adjust clothing over hips. OT Short Term Goal 3 (Week 1): Pt will be able to don shirt with set up. OT Short Term Goal 4 (Week 1): Pt will be able to don pants with mod A. OT Short Term Goal 5 (Week 1): Pt will be able to transfer to the toilet with min A.  Skilled Therapeutic Interventions/Progress Updates:    Patient in bed, alert and ready for therapy session.  Supine to SSP with CS.  Sit to stand and SPT with RW to/from bed, w/c and mat table with CS/CGA.  Short distance ambulation with RW CGA and cues for step length/walker placement.  CM in stance CGA.  Patient completed seated trunk mobility/stretching, UB ROM activities, seated and standing balance and core mobility activities, reaching tasks with good tolerance and no LOB.  Patient ambulated 80 feet at close of session with RW, CGA.  Returned to bed with CS.  Call bell in reach and bed alarm set.  Therapy Documentation Precautions:  Precautions Precautions: Fall Restrictions Weight Bearing Restrictions: No Other Position/Activity Restrictions: B foot pain from gout/cellulitis General:   Vital Signs:   Pain: Pain Assessment Pain Scale: 0-10 Pain Score: 2  Pain Location: Foot Pain Orientation: Right;Left Pain Descriptors / Indicators: Discomfort Pain Intervention(s): Repositioned Other Treatments:     Therapy/Group: Individual Therapy  [name] 12/23/2018, 12:28 PM

## 2018-12-23 NOTE — Progress Notes (Addendum)
Physical Therapy Session Note  Patient Details  Name: David [name]. MRN: [id number] Date of Birth: Aug 26, 1940  Today's Date: 12/23/2018 PT Individual Time: 0800-0900 PT Individual Time Calculation (min): 60 min   Short Term Goals: Week 1:  PT Short Term Goal 1 (Week 1): Pt will perform sit<>stand at min A level with mod cues for technique. PT Short Term Goal 2 (Week 1): Pt will propel w/c with BUEs and/or LEs x 50' at S level. PT Short Term Goal 3 (Week 1): Pt will ambulate x 50' with RW at min A level Week 2:    Week 3:     Skilled Therapeutic Interventions/Progress Updates:      Therapy Documentation Precautions:  Precautions Precautions: Fall Restrictions Weight Bearing Restrictions: No Other Position/Activity Restrictions: B foot pain from gout/cellulitis General:   Pain:   Pt reports 5/10 pain at rest, mild increase w/gait and standing but reports this is "better than it has been".  Treatment to tolerance.     Pt initially supine w/MD educating pt.  Pt supine to sit on edge of bed using rails and HOB elevated w/cga.  In sitting pt removed shirt w/supervision for balance. Sit to stand from elevated bed w/cga.  Increased AP sway in standing requiring min assist for balance without AD. Pt lowered pants in standing w/min assist for balance. Stand to sit w/CGA and removed pants w/cga for balance Therapist assisted pt w/donning boxers and pants in sitting to knees, sit to stand w/cga, total assist to raise pants, zip and button and min assist for balance.  Bed to wc via stand pivot w/min assist. wc propulsion 90ft w/cues for efficiency due to tendency pt using alternating UE's and short strides.  Gait 48ft x 1, 80x1 w/cga, cues to increase clearance due to tendency to shuffle.  wc to Nustep w/min assist for balance.   NuStep L3.0 x 10 min RPE 5/10 for cardiovascular conditioining and strengthening in decreased WBing condition.  Pt returned to room w/need for BM.   Gait 10 ft to commode using RW including inclided threshold w/cga.  Commode transfer w/RW and cga.  Pt left on commode w/nurse and NT present to assist.  Assessment:  Pt w/decreased pain in feet today w/mobility.  Daughter brought shoes, but pt states they do not fit properly and chose to wear slippers.  Improved gait tolerance.     Therapy/Group: Individual Therapy Callie Fielding, PT   12/23/2018, 12:59 PM

## 2018-12-23 NOTE — Progress Notes (Signed)
DENTAL CONSULTATION  Date of Consultation:  12/23/2018 Patient Name:   David [name]. Date of Birth:   06/07/1940 Medical Record Number: [id number]  COVID 19 SCREENING: The patient does not symptoms concerning for COVID-19 infection (Including fever, chills, cough, or new SHORTNESS OF BREATH).    VITALS: BP 114/75 (BP Location: Right Arm)   Pulse 79   Temp 98.6 F (37 C) (Oral)   Resp 17   Ht 5' 9"  (1.753 m)   Wt 92.2 kg   SpO2 99%   BMI 30.01 kg/m   CHIEF COMPLAINT: Patient referred by Dr. Alysia Penna for a dental consultation.  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 Letta Pate for history of generalized weakness.  Patient has a complex medical history including malignant melanoma with brain metastases, history of cerebral hemorrhage, altered mental status and aphasia.  Patient has been complaining of nonspecific right mandibular pain.  Dental consultation has been requested for evaluation of possible dental etiology.   Patient indicates that he had a nonspecific pain involving either the maxillary or mandibular molars.  This started approximately 2 to 3 days ago.  The pain was acute and sharp in nature and was relieved by Tylenol.  There is no current pain noted at this time.  The patient indicates that he sees Dr. Odis Luster in [facility] for his dental recalls.  Patient is usually seen on an every 36-monthbasis.  Dr. KRandel Piggis the dentist who bought out the dental practice of Dr. HKenton Kingfisher Patient denies having any dental phobia.  PROBLEM LIST: Patient Active Problem List   Diagnosis Date Noted  . Acute gout due to renal impairment involving multiple sites   . Cellulitis   . Debility 12/18/2018  . AKI (acute kidney injury) ([facility] [facility] 12/17/2018  . DM type 2 (diabetes mellitus, type 2) ([facility] 12/17/2018  . Weakness generalized 12/16/2018  . Altered mental status 12/09/2018  .  Sepsis ([facility] 12/09/2018  . Fever 12/09/2018  . Neutropenia with fever ([facility] 12/09/2018  . Thrombocytopenia ([facility] 12/09/2018  . Acute pain of left wrist 11/15/2018  . Insomnia 10/14/2018  . Heart block AV complete ([facility] 10/01/2018  . Presence of biventricular cardiac pacemaker 10/01/2018  . Hypogonadism in male 04/18/2018  . Dandruff in adult 03/27/2018  . Dyslipidemia 03/27/2018  . Acute pain of right shoulder 03/11/2018  . Elevated PSA 12/24/2017  . Prediabetes 12/24/2017  . Hx of CABG 07/17/2017  . Alcohol use 04/19/2017  . Cognitive impairment 02/25/2017  . History of implantation of joint prosthesis of elbow 02/12/2017  . Osteoarthritis of knee 02/12/2017  . Hypercalcemia 11/01/2016  . Nontraumatic cortical hemorrhage of left cerebral hemisphere ([facility] 06/27/2016  . Hematochezia 05/06/2016  . Advanced care planning/counseling discussion 05/03/2016  . Atherosclerosis of native coronary artery of native heart with stable angina pectoris ([facility] 04/24/2016  . Ischemic cardiomyopathy 04/24/2016  . Acquired hypothyroidism 11/24/2015  . Metastatic melanoma to lung ([facility]   . MDD (major depressive disorder), recurrent episode, moderate ([facility]   . Benign prostatic hyperplasia   . Metastasis to brain ([facility] 10/15/2015  . Malignant melanoma ([facility] 09/22/2015  . Lung mass   . Chronic kidney disease, stage III (moderate) (HCC)   . Overweight with body mass index (BMI) 25.0-29.9 08/26/2015  . Incontinence 08/26/2015  . Hemorrhagic stroke ([facility] 08/26/2015  . Hemiplegia and hemiparesis following nontraumatic intracerebral hemorrhage affecting right dominant side ([facility] 08/26/2015  . Aphasia following  nontraumatic intracerebral hemorrhage 08/26/2015  . Gait disturbance, post-stroke 08/26/2015  . Benign essential HTN   . OSA on CPAP   . Biventricular ICD (implantable cardioverter-defibrillator) in place   . ICH (intracerebral hemorrhage) (HCC) - L frontal due to HTN vs CAA 08/22/2015     PMH: Past Medical History:  Diagnosis Date  . Anginal pain ([place])   . Arthritis    mostly hands  . Benign familial hematuria   . BPH (benign prostatic hypertrophy)   . Brain cancer ([place])    melanoma with brain met  . Coronary artery disease   . GERD (gastroesophageal reflux disease)   . High cholesterol   . Hypertension   . Hypothyroidism   . Intracerebral hemorrhage ([place])   . Melanoma ([place])   . Metastatic melanoma to lung ([place])   . Mood disorder ([place])   . Myocardial infarction ([place])   . Pacemaker    due to syncope, 3rd degree HB (upgrade to [facility]. Jude CRT-P 02/25/13 (Dr. Uvaldo Rising)  . Rectal bleed    due to NSAIDS  . Renal disorder   . Sepsis ([place]) 12/09/2018  . Skin cancer    melanoma  . Sleep apnea    uses CPAP  . Stroke [facility])     PSH: Past Surgical History:  Procedure Laterality Date  . APPLICATION OF CRANIAL NAVIGATION N/A 10/15/2015   Procedure: APPLICATION OF CRANIAL NAVIGATION;  Surgeon: Erline Levine, MD;  Location: [facility]UR[facility] ORS;  Service: Neurosurgery;  Laterality: N/A;  . CARDIAC CATHETERIZATION  2013  . CARDIAC SURGERY     bypass X 2  . COLONOSCOPY WITH PROPOFOL N/A 05/08/2016   diverticulosis, int hem, no f/u needed [name])  . CORONARY ARTERY BYPASS GRAFT  2013   LIMA-LAD, SVG-PDA 03/27/11 (Dr. Francee Gentile)  . CRANIOTOMY N/A 10/15/2015   Procedure: LEFT FRONTAL CRANIOTOMY TUMOR EXCISION with Curve;  Surgeon: Erline Levine, MD;  Location: [facility] ORS;  Service: Neurosurgery;  Laterality: N/A;  CRANIOTOMY TUMOR EXCISION  . EYE SURGERY    . FOOT SURGERY  08/2010  . JOINT REPLACEMENT Left    partial knee  . KNEE ARTHROSCOPY  12/2008  . LYMPH NODE BIOPSY    . PACEMAKER INSERTION    . TONSILLECTOMY      ALLERGIES: Allergies  Allergen Reactions  . Ezetimibe Other (See Comments)    Unknown reaction  . Simvastatin Other (See Comments)    Reaction:  Gave pt a fever  Fever - temp of 103.   In 2003    MEDICATIONS: Current Facility-Administered Medications   Medication Dose Route Frequency Provider Last Rate Last Dose  . acetaminophen (TYLENOL) tablet 650 mg  650 mg Oral Q6H PRN Cathlyn Parsons, PA-C   650 mg at 12/21/18 2003   Or  . acetaminophen (TYLENOL) suppository 650 mg  650 mg Rectal Q6H PRN Angiulli, Lavon Paganini, PA-C      . amLODipine (NORVASC) tablet 5 mg  5 mg Oral Daily Cathlyn Parsons, PA-C   5 mg at 12/23/18 0751  . aspirin EC tablet 81 mg  81 mg Oral QPM AngiulliLavon Paganini, PA-C   81 mg at 12/22/18 1710  . bisacodyl (DULCOLAX) suppository 10 mg  10 mg Rectal Daily PRN Angiulli, Lavon Paganini, PA-C      . citalopram (CELEXA) tablet 10 mg  10 mg Oral Daily Cathlyn Parsons, PA-C   10 mg at 12/23/18 0752  . enoxaparin (LOVENOX) injection 40 mg  40 mg Subcutaneous Q24H Lauraine Rinne  J, PA-C   40 mg at 12/22/18 2034  . glipiZIDE (GLUCOTROL) tablet 2.5 mg  2.5 mg Oral BID AC Kirsteins, Luanna Salk, MD   2.5 mg at 12/23/18 0644  . HYDROcodone-acetaminophen (NORCO/VICODIN) 5-325 MG per tablet 1 tablet  1 tablet Oral Q6H PRN Courtney Heys, MD   1 tablet at 12/23/18 0751  . insulin aspart (novoLOG) injection 0-15 Units  0-15 Units Subcutaneous TID WC AngiulliLavon Paganini, PA-C   3 Units at 12/23/18 1248  . levothyroxine (SYNTHROID) tablet 137 mcg  137 mcg Oral Q0600 Cathlyn Parsons, PA-C   137 mcg at 12/23/18 9323  . methylphenidate (RITALIN) tablet 5 mg  5 mg Oral BID WC Kirsteins, Luanna Salk, MD   5 mg at 12/23/18 1248  . metoprolol succinate (TOPROL-XL) 24 hr tablet 75 mg  75 mg Oral BID Cathlyn Parsons, PA-C   75 mg at 12/23/18 0751  . ondansetron (ZOFRAN) tablet 4 mg  4 mg Oral Q6H PRN Angiulli, Lavon Paganini, PA-C       Or  . ondansetron [facility]) injection 4 mg  4 mg Intravenous Q6H PRN Angiulli, Lavon Paganini, PA-C      . polyethylene glycol (MIRALAX / GLYCOLAX) packet 17 g  17 g Oral BID Cathlyn Parsons, PA-C   17 g at 12/23/18 0752  . [START ON 12/24/2018] predniSONE (DELTASONE) tablet 50 mg  50 mg Oral Q breakfast Lovorn, Megan, MD        Followed by  . [START ON 12/26/2018] predniSONE (DELTASONE) tablet 40 mg  40 mg Oral Q breakfast Lovorn, Megan, MD       Followed by  . [START ON 12/29/2018] predniSONE (DELTASONE) tablet 20 mg  20 mg Oral Q breakfast Lovorn, Megan, MD       Followed by  . [START ON 01/01/2019] predniSONE (DELTASONE) tablet 10 mg  10 mg Oral Q breakfast Lovorn, Megan, MD      . rosuvastatin (CRESTOR) tablet 20 mg  20 mg Oral QHS AngiulliLavon Paganini, PA-C   20 mg at 12/22/18 2034  . senna (SENOKOT) tablet 8.6 mg  1 tablet Oral QHS Cathlyn Parsons, PA-C   8.6 mg at 12/22/18 2034  . sorbitol 70 % solution 30 mL  30 mL Oral Daily PRN Angiulli, Lavon Paganini, PA-C      . traZODone (DESYREL) tablet 25 mg  25 mg Oral QHS PRN Cathlyn Parsons, PA-C   25 mg at 12/22/18 2034   Facility-Administered Medications Ordered in Other Encounters  Medication Dose Route Frequency Provider Last Rate Last Dose  . 0.9 %  sodium chloride infusion  1,000 mL Intravenous Once [name], MD        LABS: Lab Results  Component Value Date   WBC 2.4 (L) 12/23/2018   HGB 9.9 (L) 12/23/2018   HCT 28.6 (L) 12/23/2018   MCV 94.7 12/23/2018   PLT 149 (L) 12/23/2018      Component Value Date/Time   NA 134 (L) 12/23/2018 0633   NA 140 03/26/2017 0928   K 4.8 12/23/2018 0633   K 4.7 03/26/2017 0928   CL 103 12/23/2018 0633   CO2 21 (L) 12/23/2018 0633   CO2 21 (L) 03/26/2017 0928   GLUCOSE 165 (H) 12/23/2018 0633   GLUCOSE 95 03/26/2017 0928   BUN 74 (H) 12/23/2018 0633   BUN 19.3 03/26/2017 0928   CREATININE 1.96 (H) 12/23/2018 0633   CREATININE 1.77 (H) 05/24/2018 1445   CREATININE 1.2 03/26/2017  7425   CALCIUM 8.6 (L) 12/23/2018 0633   CALCIUM 8.7 03/26/2017 0928   GFRNONAA 32 (L) 12/23/2018 0633   GFRNONAA 36 (L) 05/24/2018 1445   GFRAA 37 (L) 12/23/2018 0633   GFRAA 42 (L) 05/24/2018 1445   Lab Results  Component Value Date   INR 1.4 (H) 12/08/2018   INR 1.00 09/02/2016   INR 0.96 05/06/2016   No results found for:  PTT  SOCIAL HISTORY: Social History   Socioeconomic History  . Marital status: Married    Spouse name: Not on file  . Number of children: 2  . Years of education: Not on file  . Highest education level: Not on file  Occupational History  . Occupation: retired Pharmacist, community  Social Needs  . Financial resource strain: Not on file  . Food insecurity    Worry: Not on file    Inability: Not on file  . Transportation needs    Medical: Not on file    Non-medical: Not on file  Tobacco Use  . Smoking status: Former Smoker    Packs/day: 1.00    Years: 3.00    Pack years: 3.00    Quit date: 04/10/1957    Years since quitting: 61.7  . Smokeless tobacco: Never Used  Substance and Sexual Activity  . Alcohol use: Yes    Alcohol/week: 14.0 standard drinks    Types: 10 Glasses of wine, 4 Cans of beer per week  . Drug use: No  . Sexual activity: Not Currently  Lifestyle  . Physical activity    Days per week: Not on file    Minutes per session: Not on file  . Stress: Not on file  Relationships  . Social Herbalist on phone: Not on file    Gets together: Not on file    Attends religious service: Not on file    Active member of club or organization: Not on file    Attends meetings of clubs or organizations: Not on file    Relationship status: Not on file  . Intimate partner violence    Fear of current or ex partner: Not on file    Emotionally abused: Not on file    Physically abused: Not on file    Forced sexual activity: Not on file  Other Topics Concern  . Not on file  Social History Narrative   Lives with wife [name]) with dementia in [facility]   Daughter [name] and son   Occ: retired Pharmacist, community practiced in [place]   Has living will   [name] is health care [place]   Requests DNR-- done 09/14/15   Not sure about tube feeds    FAMILY HISTORY: Family History  Problem Relation Age of Onset  . Cancer Mother 69       lung   . Hypertension Mother   .  Hypertension Father   . Heart attack Father   . Heart disease Father   . Rheum arthritis Sister   . Cancer Maternal Grandmother     REVIEW OF SYSTEMS: Reviewed with the patient as per History of present illness. Psych: Patient denies having dental phobia.  DENTAL HISTORY: CHIEF COMPLAINT: Patient referred by Dr. Alysia Penna for a dental consultation.  HPI: Amad Mau. 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 Letta Pate for history of generalized weakness.  Patient has a complex medical history including malignant melanoma with brain metastases, history of  cerebral hemorrhage, altered mental status, and aphasia.  Patient has been complaining of nonspecific right mandibular pain.  Dental consultation has been requested for evaluation of possible dental etiology.   Patient indicates that he had a nonspecific pain involving either the maxillary or mandibular molars.  This started approximately 2 to 3 days ago.  The pain was acute and sharp in nature and was relieved by Tylenol.  There is no current pain noted at this time.  The patient indicates that he sees Dr. Odis Luster in [facility] for his dental recalls.  Patient is usually seen on an every 47-monthbasis.  Dr. KRandel Piggis the dentist who bought out the dental practice of Dr. HKenton Kingfisher Patient denies having any dental phobia.  DENTAL EXAMINATION: GENERAL: The patient is a well-developed, well-nourished male no acute distress. HEAD AND NECK: There is no palpable submandibular lymphadenopathy.  The patient denies acute TMJ symptoms.  Palpation of the upper right and lower right cheek areas did not elicit any pain.  I do not appreciate any significant facial swelling. INTRAORAL EXAM: Patient has normal saliva.  Patient has LARGE bilateral mandibular lingual tori.  Patient also has large bilateral maxillary and mandibular buccal exostoses.  DENTITION: Patient is  missing tooth numbers 1, 16, 17, 23, 26, and 32.  The spaces of the lower anterior teeth are almost closed.  Patient denies having any orthodontic therapy.  There is evidence of maxillary mandibular incisal attrition. PERIODONTAL: Patient has chronic periodontitis with minimal plaque accumulations, selective areas gingival recession and no significant tooth mobility. DENTAL CARIES/SUBOPTIMAL RESTORATIONS: There are no obvious dental caries noted. ENDODONTIC: Patient has a history of nonspecific dental pain that does not appear to be related to periapical pathology.   CROWN AND BRIDGE: Patient has multiple crown restorations involving tooth numbers 1, 2, 13, 14, 15, 18, 19, 20, 30, and 31. PROSTHODONTIC: There are no partial dentures. OCCLUSION: Patient has a poor occlusal scheme but a stable occlusion secondary to missing teeth, incisal attrition, and diastemas.  RADIOGRAPHIC INTERPRETATION: Orthopantogram was taken today by the department of radiology. There are multiple missing teeth.  There are multiple diastemas.  There are multiple crown and bridge restorations noted.  There is incipient to moderate bone loss.  There are no obvious periapical radiolucencies noted.  There are multiple areas of radiopacity consistent with the large bilateral mandibular lingual tori and bilateral maxillary and mandibular buccal exostoses.   ASSESSMENTS: 1.  History of generalized weakness with current inpatient rehabilitation 2.  History of nonspecific dental pain involving the right side-this appears to have resolved at this time. 3.  Bilateral, large mandibular lingual tori 4.  Bilateral maxillary and mandibular buccal exostoses 5.  Chronic periodontitis of bone loss 6.  Accretions-minimal 7.  Multiple missing teeth 8.  Multiple diastemas 9.  Poor occlusal scheme but a stable occlusion 10.  Incisal attrition 11.  Multiple dental restorations that appear to be in good repair 12.  History of multiple doses  of Xgeva with risk for osteonecrosis of the jaw  PLAN/RECOMMENDATIONS: 1. I discussed the presence of the nonspecific dental pain in relationship to his current medical and dental conditions.  The patient currently is not able to localize where the pain is coming from and is not experiencing any significant dental pain at this time.  The orthopantogram does not identify any evidence of periapical pathology or radiolucency or reason for the pain that he has been experiencing.  Obtaining more definitive periapical radiographs is not  possible due to the extent of his bilateral mandibular lingual tori and buccal exostoses.  Ideally, the patient will follow-up with his primary dentist who might be able to discern where the pain is coming from if it becomes more acute.  I am not sure if the previous Xgeva therapy is contributing to this dental pain.  However he is at risk for osteonecrosis of the jaw due to the multiple doses of Xgeva therapy previously given.  The patient indicates that he will follow-up with his primary dentist, Dr. Odis Luster is indicated for further dental evaluation.  However, after reviewing the chart I am not sure if he will be able to do this and question his overall mental status.  During review of his own dental radiographs, he was unable to ascertain the difference between the right and left sides of the radiograph and therefore I am questioning his overall receptive abilities and feel this may reflective of significant receptive aphasia.  If patient is unable to follow-up with Dr. Odis Luster, I would recommend following up with Dr. Johnnette Litter in [facility] who has her practice near the [facility] and is very well trained in treatment of medically compromised patients.  2. Discussion of findings with medical team and coordination of future medical and dental care as needed.    Lenn Cal, DDS

## 2018-12-23 NOTE — Progress Notes (Addendum)
Patient has been refusing CPAP. Will assess and see if patient wants to use home CPAP stating in orders.

## 2018-12-23 NOTE — Progress Notes (Signed)
Speech Language Pathology Daily Session Note  Patient Details  Name: David [name]. MRN: [id number] Date of Birth: 02/17/1941  Today's Date: 12/23/2018 SLP Individual Time: 8333-8329 SLP Individual Time Calculation (min): 53 min  Short Term Goals: Week 1: SLP Short Term Goal 1 (Week 1): Pt will demonstrate self-awareness and self-correction of functional errors in problem solving tasks with supervision A verbal cues. SLP Short Term Goal 1 - Progress (Week 1): Updated due to goal met SLP Short Term Goal 2 (Week 1): Pt will demonstrate selective attention for ~ 30 minutes with Min A cues. SLP Short Term Goal 3 (Week 1): Pt will complete semi-complex problem solving tasks (eg. medicaton management) with supervision A cues. SLP Short Term Goal 4 (Week 1): Pt will demonstrate recall of new, daily information with supervision A verbal cues for use of external aid.  Skilled Therapeutic Interventions: Skilled ST services focused on cognitive skills. Pt expressed increase cognitive ability since hospitalization and stated " I am feeling more like myself" SLP administered Cognistat, pt scored WFL on all sections with only mild impairment in memory ( immediate and short term.) Pt supported cognitive skills back to cognitive baseline ( memory, high level attention and word finding deficits), except reduced processing speed demonstrated in construction task targeting semi-complex problem solving skills. SLP wrote down current medication and began to review it, leaving it for "homework" for pt to review, when session was ended 5 minutes early due to MD dental consult. SLP will complete medication management skills in upcoming session. Pt was left in room with MD, call bell within reach and bed alarm set. ST recommends to continue skilled ST services.      Pain Pain Assessment Pain Scale: 0-10 Pain Score: 4  Pain Type: Acute pain Pain Location: Jaw Pain Orientation: Right;Left Pain Descriptors /  Indicators: Aching Pain Frequency: Intermittent Pain Onset: On-going Patients Stated Pain Goal: 3 Pain Intervention(s): Medication (See eMAR)(norco given)  Therapy/Group: Individual Therapy  David [name]  [facility] 12/23/2018, 3:24 PM

## 2018-12-24 ENCOUNTER — Ambulatory Visit: Payer: Medicare Other

## 2018-12-24 ENCOUNTER — Inpatient Hospital Stay (HOSPITAL_COMMUNITY): Payer: Medicare Other | Admitting: Physical Therapy

## 2018-12-24 ENCOUNTER — Inpatient Hospital Stay (HOSPITAL_COMMUNITY): Payer: Medicare Other | Admitting: Occupational Therapy

## 2018-12-24 ENCOUNTER — Telehealth: Payer: Self-pay | Admitting: Hematology

## 2018-12-24 ENCOUNTER — Inpatient Hospital Stay (HOSPITAL_COMMUNITY): Payer: Medicare Other

## 2018-12-24 LAB — GLUCOSE, CAPILLARY
Glucose-Capillary: 144 mg/dL — ABNORMAL HIGH (ref 70–99)
Glucose-Capillary: 151 mg/dL — ABNORMAL HIGH (ref 70–99)
Glucose-Capillary: 268 mg/dL — ABNORMAL HIGH (ref 70–99)
Glucose-Capillary: 345 mg/dL — ABNORMAL HIGH (ref 70–99)

## 2018-12-24 NOTE — Progress Notes (Signed)
Physical Therapy Session Note  Patient Details  Name: David [name]. MRN: [id number] Date of Birth: January 01, 1941  Today's Date: 12/24/2018 PT Individual Time: 0802-0832 PT Individual Time Calculation (min): 30 min   Short Term Goals: Week 1:  PT Short Term Goal 1 (Week 1): Pt will perform sit<>stand at min A level with mod cues for technique. PT Short Term Goal 2 (Week 1): Pt will propel w/c with BUEs and/or LEs x 50' at S level. PT Short Term Goal 3 (Week 1): Pt will ambulate x 50' with RW at min A level  Skilled Therapeutic Interventions/Progress Updates:    Supervision to come to EOB with use of bedrail for support. Pt able to perform functional sit <> stands and gait within room with RW with overall CGA assist. Gait x 15' with cues for safe placement of RW and positioning of self during uneven floor transition into/out of the bathroom. +BM and void. Pt able to perform clothing management and dynamic standing balance during hygiene with CGA overall. Pt able to perform hand and oral hygiene at sink in standing with close supervision for balance. Functional gait training on unit x 65' x 2 with seated rest break and cues for upright posture (flexed trunk), positioning of RW to promote upright posture, and overall safety with mobility at CGA level. End of session set up in w/c with all needs in reach.   Therapy Documentation Precautions:  Precautions Precautions: Fall Restrictions Weight Bearing Restrictions: No Other Position/Activity Restrictions: B foot pain from gout/cellulitis  Pain:  Reports pain in feet is better. He feels "medium" today.   Therapy/Group: Individual Therapy  Canary Brim Ivory Broad, PT, DPT, CBIS  12/24/2018, 8:42 AM

## 2018-12-24 NOTE — Progress Notes (Signed)
Speech Language Pathology Daily Session Note  Patient Details  Name: David [name]. MRN: [id number] Date of Birth: 02-18-1941  Today's Date: 12/24/2018 SLP Individual Time: 0915-1000 SLP Individual Time Calculation (min): 45 min  Short Term Goals: Week 1: SLP Short Term Goal 1 (Week 1): Pt will demonstrate self-awareness and self-correction of functional errors in problem solving tasks with supervision A verbal cues. SLP Short Term Goal 1 - Progress (Week 1): Updated due to goal met SLP Short Term Goal 2 (Week 1): Pt will demonstrate selective attention for ~ 30 minutes with Min A cues. SLP Short Term Goal 3 (Week 1): Pt will complete semi-complex problem solving tasks (eg. medicaton management) with supervision A cues. SLP Short Term Goal 4 (Week 1): Pt will demonstrate recall of new, daily information with supervision A verbal cues for use of external aid.  Skilled Therapeutic Interventions:Skilled ST services focused on cognitive skills. SLP facilitated recall of current medications name/function/times per day, pt required supervision A verbal cues for use of visual aid. Pt expressed utilizing prefilled medication packets, therefore pill box task would not be functional to complete at this time. SLP facilitated semi-complex problem solving skills in calendar task, however was limited to due to reduce mental flexibility, pt was willing to record tasks pertaining to appointments only reflecting responbilities at home and required supervision A for problem solving. SLP and pt believe pt is near cognitive baseline, suggest due to limited responsibilities at home; watching TV, heating up dinner, cooking eggs, recalling when to take medication and driving to appointments. Pt demonstrated alternating attention during calendar task continuing conversation while completing writing a repetitive appointment. Pt did demonstrated reduced anticipatory awareness pertaining to activities at discharge. Pt  was left in room with call bell within reach and chair alarm set. ST recommends to continue skilled ST services.      Pain Pain Assessment Pain Scale: 0-10 Pain Score: 0-No pain  Therapy/Group: Individual Therapy  Reyce Lubeck  [facility] 12/24/2018, 12:51 PM

## 2018-12-24 NOTE — Plan of Care (Signed)
  Problem: Consults Goal: RH GENERAL PATIENT EDUCATION Description: See Patient Education module for education specifics. Outcome: Progressing Goal: Diabetes Guidelines if Diabetic/Glucose > 140 Description: If diabetic or lab glucose is > 140 mg/dl - Initiate Diabetes/Hyperglycemia Guidelines & Document Interventions  Outcome: Progressing   Problem: RH BOWEL ELIMINATION Goal: RH STG MANAGE BOWEL WITH ASSISTANCE Description: STG Manage Bowel with mod I Assistance. Outcome: Progressing   Problem: RH BLADDER ELIMINATION Goal: RH STG MANAGE BLADDER WITH ASSISTANCE Description: STG Manage Bladder With mod I.Assistance Outcome: Progressing   Problem: RH SKIN INTEGRITY Goal: RH STG SKIN FREE OF INFECTION/BREAKDOWN Description: With mod I Assist. Outcome: Progressing Goal: RH STG MAINTAIN SKIN INTEGRITY WITH ASSISTANCE Description: STG Maintain Skin Integrity With mod I Assistance. Outcome: Progressing   Problem: RH SAFETY Goal: RH STG ADHERE TO SAFETY PRECAUTIONS W/ASSISTANCE/DEVICE Description: STG Adhere to Safety Precautions With mod I Assistance/Device. Outcome: Progressing   Problem: RH PAIN MANAGEMENT Goal: RH STG PAIN MANAGED AT OR BELOW PT'S PAIN GOAL Description: Less than 3. Outcome: Progressing

## 2018-12-24 NOTE — Progress Notes (Signed)
Occupational Therapy Session Note  Patient Details  Name: David [name]. MRN: [id number] Date of Birth: [birth year]  Today's Date: 12/24/2018 OT Individual Time: 1300-1400 OT Individual Time Calculation (min): 60 min    Short Term Goals: Week 1:  OT Short Term Goal 1 (Week 1): Pt will be able to sit to stand with min A to prepare for clothing management with LB self care and toileting. OT Short Term Goal 2 (Week 1): Pt will be able to stand with min A to adjust clothing over hips. OT Short Term Goal 3 (Week 1): Pt will be able to don shirt with set up. OT Short Term Goal 4 (Week 1): Pt will be able to don pants with mod A. OT Short Term Goal 5 (Week 1): Pt will be able to transfer to the toilet with min A.  Skilled Therapeutic Interventions/Progress Updates:    Pt seen for OT ADL bathing/dressing session. Pt awake sitting up in bed upon arrival, agreeable to tx session and denying pain.   Transfers: SUpine>sitting EOB supervision using hospital bed functions. CGA ambulatory transfers using RW with VCs for RW management in functional context. Sit<>stand with guarding assist and at times increased time/trials to power into standing. In ADL apartment completed simulated tub/shower transfer utilizing tub transfer bench. Following demonstration, pt return demonstrated with close supervision and VCs for technique. Pt in agreement that this is safest method for most independence at d/c and agreeable for d/c recommendation.  Completed sit>stand from low soft surface couch with min A.  ADL re-training: Toileting task completed with steadying assist. HE bathed seated on shower chair in shower with steadying assist when standing to compelte pericare/buttock hygiene and requireung VCs for safetyt awareness as pt then began to attempt to wash each LE from standing position as well. Education provided regarding pt's high fall risk and steps to reduce fall risk.  He returned to w/c to dress. Guarding  assist when standing to pull up underwear/pants, total A TED hose and donned slide on slippers. UB dressed with set-up.   Pt ambulated from ADL apartment back to room at end of session with close supervision. Returned to supine and left with all needs in reach and bed alarm on.    Therapy Documentation Precautions:  Precautions Precautions: Fall Restrictions Weight Bearing Restrictions: No Other Position/Activity Restrictions: B foot pain from gout/cellulitis   Therapy/Group: Individual Therapy  Laurance Heide L 12/24/2018, 6:59 AM

## 2018-12-24 NOTE — Progress Notes (Signed)
[place] PHYSICAL MEDICINE & REHABILITATION PROGRESS NOTE   Subjective/Complaints:   Pt reports R jaw pain is not there right now- bothering now intermittently.  Feet and L hand are MUCH better today- wasn't sure why (explained I increased Steroids).    ROS Neg CP, SOB N/V/D   Objective:   Dg Orthopantogram  Result Date: 12/23/2018 CLINICAL DATA:  Acute pulpitis.  Lower right jaw pain EXAM: ORTHOPANTOGRAM/PANORAMIC COMPARISON:  None. FINDINGS: No evidence of periapical abscess or acute bony abnormality. IMPRESSION: No acute findings. Electronically Signed   By: Rolm Baptise M.D.   On: 12/23/2018 09:56   Recent Labs    12/21/18 2200 12/23/18 0633  WBC 4.5 2.4*  HGB 11.5* 9.9*  HCT 32.9* 28.6*  PLT 164 149*   Recent Labs    12/23/18 0633  NA 134*  K 4.8  CL 103  CO2 21*  GLUCOSE 165*  BUN 74*  CREATININE 1.96*  CALCIUM 8.6*    Intake/Output Summary (Last 24 hours) at 12/24/2018 1311 Last data filed at 12/24/2018 0700 Gross per 24 hour  Intake 600 ml  Output 1650 ml  Net -1050 ml     Physical Exam: Vital Signs Blood pressure 114/84, pulse 75, temperature (!) 97.5 F (36.4 C), temperature source Oral, resp. rate 18, height 5\' 9"  (1.753 m), weight 92.2 kg, SpO2 98 %.  Neurological:  Reviewed vitals, nursing notes . Pt awake, sitting up in bed; OT in room, helping gets clothes out to get dressed; was directing PT to get correct clothing, NAD Mood and affect are appropriate; brighter affect Heart: Regular rate and rhythm  Lungs: Clear to auscultation B/L Abdomen: Positive bowel sounds, soft, NT, ND Extremities:much less swelling/erythema (light pink vs yesterday bright red) L 3rd digit; B/L feet much less swelling; edema; and warmth is also less- also less TTP- was able ot tolerate taking socks off and put back on B/L Neurologic:  motor strength is 5/5 in bilateral deltoid, bicep, tricep, grip, 4/5 Bilateral hip flexor, knee extensors, ankle dorsiflexor  and plantar flexor No skin breakdown, rash on face/nose  Oral cavity- No TTP  Right lower jaw   Assessment/Plan: 1. Functional deficits secondary to Debility with recent cellulitis and gout flare x2- previous and current which require 3+ hours per day of interdisciplinary therapy in a comprehensive inpatient rehab setting.  Physiatrist is providing close team supervision and 24 hour management of active medical problems listed below.  Physiatrist and rehab team continue to assess barriers to discharge/monitor patient progress toward functional and medical goals  Care Tool:  Bathing    Body parts bathed by patient: Right arm, Left arm, Chest, Abdomen, Front perineal area, Right upper leg, Left upper leg, Face, Buttocks, Right lower leg, Left lower leg   Body parts bathed by helper: Right lower leg, Left lower leg     Bathing assist Assist Level: Contact Guard/Touching assist     Upper Body Dressing/Undressing Upper body dressing   What is the patient wearing?: Pull over shirt    Upper body assist Assist Level: Set up assist    Lower Body Dressing/Undressing Lower body dressing      What is the patient wearing?: Underwear/pull up, Pants     Lower body assist Assist for lower body dressing: Supervision/Verbal cueing     Toileting Toileting    Toileting assist Assist for toileting: Contact Guard/Touching assist     Transfers Chair/bed transfer  Transfers assist     Chair/bed transfer assist level: Contact Guard/Touching  assist     Locomotion Ambulation   Ambulation assist      Assist level: Contact Guard/Touching assist Assistive device: Walker-rolling Max distance: 120'   Walk 10 feet activity   Assist     Assist level: Contact Guard/Touching assist Assistive device: Walker-rolling   Walk 50 feet activity   Assist Walk 50 feet with 2 turns activity did not occur: Safety/medical concerns  Assist level: Contact Guard/Touching  assist Assistive device: Walker-rolling    Walk 150 feet activity   Assist Walk 150 feet activity did not occur: Safety/medical concerns         Walk 10 feet on uneven surface  activity   Assist Walk 10 feet on uneven surfaces activity did not occur: Safety/medical concerns         Wheelchair     Assist Will patient use wheelchair at discharge?: Yes Type of Wheelchair: Manual    Wheelchair assist level: Minimal Assistance - Patient > 75% Max wheelchair distance: 150'    Wheelchair 50 feet with 2 turns activity    Assist    Wheelchair 50 feet with 2 turns activity did not occur: Safety/medical concerns   Assist Level: Minimal Assistance - Patient > 75%   Wheelchair 150 feet activity     Assist  Wheelchair 150 feet activity did not occur: Safety/medical concerns   Assist Level: Minimal Assistance - Patient > 75%   Blood pressure 114/84, pulse 75, temperature (!) 97.5 F (36.4 C), temperature source Oral, resp. rate 18, height 5\' 9"  (1.753 m), weight 92.2 kg, SpO2 98 %.  Medical Problem List and Plan: 1.Debilitysecondary to generalized weakness related to recent cellulitis with gout flareup as well as history of malignant melanoma with metastasis to the brain Cont CIR PT, OT, SLP  2. Antithrombotics: -DVT/anticoagulation:Lovenox -antiplatelet therapy: Aspirin 81 mg daily 3. Pain Management:Tylenol as needed 4. Mood:Celexa 10 mg daily. Provide emotional support  9/14- added Ritalin yesterday for attention; however will see in next few days if will increase Celexa. -antipsychotic agents: N/A 5. Neuropsych: This patientis ? capable of making decisions on hisown behalf. 6. Skin/Wound Care:Routine skin checks 7. Fluids/Electrolytes/Nutrition:Routine in and outs with follow-up chemistries 8.t foot pain and swelling related to recurrent  gout. restart prednisone 20mg  per day x 5 d 9. Stage IV malignant melanoma  metastatic disease to the brain and lung as well as history of nontraumatic intracerebral hemorrhage with right hemiparesis. Status post craniotomy 2017 with chemotherapy as well as radiation. Followed by Dr. Burr Medico as well as Dr. Mickeal Skinner 10. Leukocytosis resolved 11. Diabetes mellitus. Hemoglobin A1c 7.4. increase Glucotrol 2.5 mg BID  CBG (last 3)  Recent Labs    12/23/18 2119 12/24/18 0643 12/24/18 1145  GLUCAP 271* 144* 151*  some lability , monitor for pattern- expect increase with prednisone   9/15- better controlled than expected considering prednisone increase 12. Hypertension. Norvasc 5 mg daily, Toprol 75 mg twice daily. Monitor with increased mobility Vitals:   12/23/18 2232 12/24/18 0524  BP: 126/80 114/84  Pulse: 80 75  Resp:  18  Temp:  (!) 97.5 F (36.4 C)  SpO2:  98%  controlled no brady  13. AKI superimposed on CKD stage III. Creatinine stabilizing after gentle IV fluids. Follow-up chemistries  9/10- Cr down to 1.67 and Bun down to 63 from 70  9/11- will recheck Monday  9/14- Cr up to 1.96 (per daughter baseline is 1.5) and BUN up to 74- will push PO fluids- wait on IVFs- will recheck Wednesday.  9/15- daughter brought in sparkling water- encouraged pt to drink more again. 8. CAD with CABG. Continue aspirin therapy no chest pain 15. Hypothyroidism. Synthroid 16. Hyperlipidemia. Crestor  17. Toothache  Exam negative and infact pt not complaining this am  18.  Elevated temp x1 UA neg, WBC normal, BC sent ? Atelectasis due to bedrest vs gout related order IS (incentive spirometer) 19. Attention- on Ritalin now 20. Gout-   9/14- increased Prednisone to 50 mg day x 3 days then 40 mg day x 3 days then 20 mg x 3 days then 10 mg x3days- a longer taper might impact his BGs, however he and daughter agree that might stop him from continuing to get gout flares.  9/15- much improved today per pt and therapist- on exam, much improved- will con't slower  taper.   Dental pain- intermittent ,dental consult ordered per family request, daughter mentioned prior hx of trigeminal neuralgia on opposite side (left) , no facial skin rash noted, pt does have beard   LOS: 6 days A FACE TO FACE EVALUATION WAS PERFORMED  Dirck Butch 12/24/2018, 1:11 PM

## 2018-12-24 NOTE — Progress Notes (Signed)
Continue to refuse CPAP upon questioning, denies discomfort , continue to monitor upon rounding

## 2018-12-24 NOTE — Telephone Encounter (Signed)
Returned patient's phone call regarding 09/16 appointments, per 09/15 schedule message 09/16 will be cancelled due to patient being in the hospital. Left a voicemail for patient to call back.

## 2018-12-24 NOTE — Progress Notes (Signed)
Patient refuse  HOME CPAP set up for the night,. Pt states that "he just wants to rest and that he will use it tomorrow night". No distress or complications noted.

## 2018-12-24 NOTE — Progress Notes (Signed)
Patient refuse home CPAP, states " he will wait until he goes home and use it then". No acute distress or discomfort

## 2018-12-24 NOTE — Progress Notes (Signed)
Physical Therapy Session Note  Patient Details  Name: David [name]. MRN: [id number] Date of Birth: [birth year]  Today's Date: 12/24/2018 PT Individual Time: 1000-1100 PT Individual Time Calculation (min): 60 min   Short Term Goals: Week 1:  PT Short Term Goal 1 (Week 1): Pt will perform sit<>stand at min A level with mod cues for technique. PT Short Term Goal 2 (Week 1): Pt will propel w/c with BUEs and/or LEs x 50' at S level. PT Short Term Goal 3 (Week 1): Pt will ambulate x 50' with RW at min A level  Skilled Therapeutic Interventions/Progress Updates:    Pt received seated in w/c in room, agreeable to PT session. Pt reports some soreness in B feet from gout, not rated and reports pain is well-controlled. Pt reports urge to urinate. Sit to stand with CGA to RW. Ambulation to bathroom with RW and CGA. Standing balance while urinating with close SBA with one UE support on grab bar. Pt requires assist for managing clothing. Manual w/c propulsion x 150 ft with use of BUE and Supervision for global endurance training. Ambulation x 120 ft with RW and CGA, pt exhibits improved tolerance for gait this date. Trial gait with one UE support on handrail in hallway with CGA, pt exhibits decreased B step length, increase in shuffling of gait, and decreased gait speed, 2 x 30'. Pt is safest to continue gait training with RW at this time. Ascend/descend 8 x 3" steps with B handrails and CGA, 3" step-up x 10 reps with B handrails and CGA for BLE strengthening. Nustep level 3 x 10 min with use of B UE/LE for global endurance training. Pt requests to return to bed at end of session. Stand pivot transfer w/c to bed with RW and CGA. Sit to supine Supervision. Pt left seated in bed with needs in reach, bed alarm in place at end of session.  Therapy Documentation Precautions:  Precautions Precautions: Fall Restrictions Weight Bearing Restrictions: No Other Position/Activity Restrictions: B foot pain from  gout/cellulitis    Therapy/Group: Individual Therapy   Excell Seltzer, PT, DPT  12/24/2018, 12:50 PM

## 2018-12-24 NOTE — Progress Notes (Signed)
Notified on call Danella Sensing PA) r/t CBG reading 345,after discussion no new orders, leave sticky note for MD to assess patient for possible coverage , patient assessed and asymptomatic continue to monitor,

## 2018-12-25 ENCOUNTER — Inpatient Hospital Stay (HOSPITAL_COMMUNITY): Payer: Medicare Other | Admitting: Occupational Therapy

## 2018-12-25 ENCOUNTER — Other Ambulatory Visit: Payer: Medicare Other

## 2018-12-25 ENCOUNTER — Encounter (HOSPITAL_COMMUNITY): Payer: Medicare Other | Admitting: Psychology

## 2018-12-25 ENCOUNTER — Inpatient Hospital Stay (HOSPITAL_COMMUNITY): Payer: Medicare Other

## 2018-12-25 ENCOUNTER — Ambulatory Visit: Payer: Medicare Other | Admitting: Hematology

## 2018-12-25 ENCOUNTER — Inpatient Hospital Stay (HOSPITAL_COMMUNITY): Payer: Medicare Other | Admitting: Speech Pathology

## 2018-12-25 LAB — CBC WITH DIFFERENTIAL/PLATELET
Abs Immature Granulocytes: 0.07 10*3/uL (ref 0.00–0.07)
Basophils Absolute: 0 10*3/uL (ref 0.0–0.1)
Basophils Relative: 0 %
Eosinophils Absolute: 0 10*3/uL (ref 0.0–0.5)
Eosinophils Relative: 1 %
HCT: 30.4 % — ABNORMAL LOW (ref 39.0–52.0)
Hemoglobin: 10.5 g/dL — ABNORMAL LOW (ref 13.0–17.0)
Immature Granulocytes: 7 %
Lymphocytes Relative: 24 %
Lymphs Abs: 0.3 10*3/uL — ABNORMAL LOW (ref 0.7–4.0)
MCH: 32.5 pg (ref 26.0–34.0)
MCHC: 34.5 g/dL (ref 30.0–36.0)
MCV: 94.1 fL (ref 80.0–100.0)
Monocytes Absolute: 0.1 10*3/uL (ref 0.1–1.0)
Monocytes Relative: 5 %
Neutro Abs: 0.7 10*3/uL — ABNORMAL LOW (ref 1.7–7.7)
Neutrophils Relative %: 63 %
Platelets: 181 10*3/uL (ref 150–400)
RBC: 3.23 MIL/uL — ABNORMAL LOW (ref 4.22–5.81)
RDW: 13.9 % (ref 11.5–15.5)
WBC: 1.1 10*3/uL — CL (ref 4.0–10.5)
nRBC: 1.9 % — ABNORMAL HIGH (ref 0.0–0.2)

## 2018-12-25 LAB — BASIC METABOLIC PANEL
Anion gap: 8 (ref 5–15)
BUN: 74 mg/dL — ABNORMAL HIGH (ref 8–23)
CO2: 23 mmol/L (ref 22–32)
Calcium: 8.8 mg/dL — ABNORMAL LOW (ref 8.9–10.3)
Chloride: 102 mmol/L (ref 98–111)
Creatinine, Ser: 1.66 mg/dL — ABNORMAL HIGH (ref 0.61–1.24)
GFR calc Af Amer: 45 mL/min — ABNORMAL LOW (ref >60)
GFR calc non Af Amer: 39 mL/min — ABNORMAL LOW (ref >60)
Glucose, Bld: 144 mg/dL — ABNORMAL HIGH (ref 70–99)
Potassium: 5.3 mmol/L — ABNORMAL HIGH (ref 3.5–5.1)
Sodium: 133 mmol/L — ABNORMAL LOW (ref 135–145)

## 2018-12-25 LAB — GLUCOSE, CAPILLARY
Glucose-Capillary: 140 mg/dL — ABNORMAL HIGH (ref 70–99)
Glucose-Capillary: 93 mg/dL (ref 70–99)
Glucose-Capillary: 262 mg/dL — ABNORMAL HIGH (ref 70–99)
Glucose-Capillary: 319 mg/dL — ABNORMAL HIGH (ref 70–99)

## 2018-12-25 MED ORDER — INSULIN ASPART 100 UNIT/ML ~~LOC~~ SOLN
0.0000 [IU] | Freq: Three times a day (TID) | SUBCUTANEOUS | Status: DC
  Administered 2018-12-25: 8 [IU] via SUBCUTANEOUS
  Administered 2018-12-26: 11 [IU] via SUBCUTANEOUS
  Administered 2018-12-26 – 2018-12-27 (×2): 2 [IU] via SUBCUTANEOUS
  Administered 2018-12-27: 18:00:00 11 [IU] via SUBCUTANEOUS

## 2018-12-25 MED ORDER — TRAZODONE HCL 50 MG PO TABS
50.0000 mg | ORAL_TABLET | Freq: Every evening | ORAL | Status: DC | PRN
  Administered 2018-12-25 – 2018-12-27 (×3): 50 mg via ORAL
  Filled 2018-12-25 (×3): qty 1

## 2018-12-25 NOTE — Plan of Care (Signed)
  Problem: Consults Goal: RH GENERAL PATIENT EDUCATION Description: See Patient Education module for education specifics. Outcome: Progressing Goal: Diabetes Guidelines if Diabetic/Glucose > 140 Description: If diabetic or lab glucose is > 140 mg/dl - Initiate Diabetes/Hyperglycemia Guidelines & Document Interventions  Outcome: Progressing   Problem: RH BOWEL ELIMINATION Goal: RH STG MANAGE BOWEL WITH ASSISTANCE Description: STG Manage Bowel with mod I Assistance. Outcome: Progressing   Problem: RH BLADDER ELIMINATION Goal: RH STG MANAGE BLADDER WITH ASSISTANCE Description: STG Manage Bladder With mod I.Assistance Outcome: Progressing   Problem: RH SKIN INTEGRITY Goal: RH STG SKIN FREE OF INFECTION/BREAKDOWN Description: With mod I Assist. Outcome: Progressing Goal: RH STG MAINTAIN SKIN INTEGRITY WITH ASSISTANCE Description: STG Maintain Skin Integrity With mod I Assistance. Outcome: Progressing   Problem: RH SAFETY Goal: RH STG ADHERE TO SAFETY PRECAUTIONS W/ASSISTANCE/DEVICE Description: STG Adhere to Safety Precautions With mod I Assistance/Device. Outcome: Progressing   Problem: RH PAIN MANAGEMENT Goal: RH STG PAIN MANAGED AT OR BELOW PT'S PAIN GOAL Description: Less than 3. Outcome: Progressing

## 2018-12-25 NOTE — Significant Event (Signed)
CRITICAL VALUE ALERT  Critical Value:  WBC 1.1  Date & Time Notied:  12/25/2018 at [place]  Provider Notified: Marlowe Shores, PA  Orders Received/Actions taken: he is to review chart and give recommendations.  Brita Romp, RN

## 2018-12-25 NOTE — Progress Notes (Signed)
Occupational Therapy Session Note  Patient Details  Name: David [name]. MRN: [id number] Date of Birth: [birth year]  Today's Date: 12/25/2018 OT Individual Time: 3716-9678 OT Individual Time Calculation (min): 60 min    Short Term Goals: Week 1:  OT Short Term Goal 1 (Week 1): Pt will be able to sit to stand with min A to prepare for clothing management with LB self care and toileting. OT Short Term Goal 2 (Week 1): Pt will be able to stand with min A to adjust clothing over hips. OT Short Term Goal 3 (Week 1): Pt will be able to don shirt with set up. OT Short Term Goal 4 (Week 1): Pt will be able to don pants with mod A. OT Short Term Goal 5 (Week 1): Pt will be able to transfer to the toilet with min A.  Skilled Therapeutic Interventions/Progress Updates:    Patient in bed, ready for therapy session.  Bed mobility with CS.  SPT to/from bed, w/c, toilet, mat table with CGA/CS.  Sponge bath and dressing w/c level with set up, CGA in stance and max A to donn teds stockings.  toileting with CGA/CS.  Patient ambulated w/ RW to/from room & therapy gym CGA and increased time. Completed seated and standing conditioning activities with focus on balance, weight shift, trunk mobility and core strength.  Patient remained seated in w/c at close of session with seat alarm set and call bell/tray table in reach.    Therapy Documentation Precautions:  Precautions Precautions: Fall Restrictions Weight Bearing Restrictions: No Other Position/Activity Restrictions: B foot pain from gout/cellulitis General:   Vital Signs:   Pain: Pain Assessment Pain Scale: 0-10 Pain Score: 3  Pain Location: Foot Pain Orientation: Right;Left Pain Descriptors / Indicators: Tender Pain Intervention(s): Repositioned;Ambulation/increased activity Other Treatments:     Therapy/Group: Individual Therapy  [name] 12/25/2018, 12:21 PM

## 2018-12-25 NOTE — Progress Notes (Signed)
Speech Language Pathology Daily Session Note  Patient Details  Name: David Jay Hennings Jr. MRN: [id number] Date of Birth: 05/16/1940  Today's Date: 12/25/2018 SLP Individual Time: 1431-1458 SLP Individual Time Calculation (min): 27 min  Short Term Goals: Week 1: SLP Short Term Goal 1 (Week 1): Pt will demonstrate self-awareness and self-correction of functional errors in problem solving tasks with supervision A verbal cues. SLP Short Term Goal 1 - Progress (Week 1): Updated due to goal met SLP Short Term Goal 2 (Week 1): Pt will demonstrate selective attention for ~ 30 minutes with Min A cues. SLP Short Term Goal 3 (Week 1): Pt will complete semi-complex problem solving tasks (eg. medicaton management) with supervision A cues. SLP Short Term Goal 4 (Week 1): Pt will demonstrate recall of new, daily information with supervision A verbal cues for use of external aid.  Skilled Therapeutic Interventions:  Skilled treatment session focused on cognition goals. SLP facilitated session by providing supervision cues to demonstrate anticipatory awareness with hypothetical situations. Pt with independent recall of previously discussing topic with OT earlier in day and independent recall of having hired someone to come into home for additional days on Saturday. Pt also able to independently recall plan to discharge on 9/19. Pt left upright in bed, bed alarm on and all needs within reach. Continue per current plan of care.      Pain Pain Assessment Pain Scale: 0-10 Pain Score: 0 Therapy/Group: Individual Therapy    12/25/2018, 2:56 PM   

## 2018-12-25 NOTE — Consult Note (Signed)
Neuropsychological Consultation   Patient:   David [name].   DOB:   [birth year]  MR Number:  [id number]  Location:  [facility] [address] C[facility] [address] [place] [id number] [place] [id number] Dept: [place]: [phone]           Date of Service:   12/25/2018  Start Time:   8 AM End Time:   9 AM  Provider/Observer:  Ilean Skill, Psy.D.       Clinical Neuropsychologist       Billing Code/Service: 95638  Chief Complaint:    [name], [name]. Is a 78 year old right handed male with history of nontraumatic ICH secondary to hypertensive stroke/crisis affecting right side motor function in 2017.  Patient with history of prostate cancer, melanoma with brain mets diagnosed 2017 status post left frontal craniotomy, tumor excision and radiation therapy to the brain and chemotherapy complicated by leukopenia, thrombocytopenia.  Patient with numerous other medical issues and wife with dementia with both living in independent living facility.    Reason for Service:  Patient referred for neuropsychological consultaion due to coping/adjustment issues and looking at residual deficits from two prior brain insults.  Below is the HPI for the current admission.  VFI:EPPIRJJ J. Omair, [place]. is a 78 year old right-handed male with history of nontraumatic intracerebral hemorrhage secondary to hypertensive crisis affecting the right side June 2017 receiving inpatient rehab services 08/26/2015 09/10/2015 and was discharged to skilled nursing facility [facility]. Patient also with history of prostate cancer,melanoma with brain metsdiagnosed 2017 status post left frontal craniotomy, tumor excision and radiation therapy to the brain and chemotherapycomplicated by leukopenia, thrombocytopenia followed byDr. Burr Medico andDr. Vaslow, hypothyroidism, hypertension, CAD with CABG, biventricular ICD. Patient recently hospitalized 12/08/2018  for right foot cellulitis, pancytopenia and did receive Granix on 12/10/2018. Patient was placed on IV antibiotics as well as p.o. steroids and discharged home on p.o. doxycycline for 3 days.Per chart review patient lives with [facility]. 1 level home. Wife reportedly with some Alzheimer's disease and cannot provide physical assistance. Daughter/Dr. Silas [place] is a local physician in the area.Family plans to hire 24-hour care as needed at time of discharge.Presented 12/16/2018 with polyuria, polydipsia, generalized weakness and body aches x3 days. Noted blood sugars in the 400s. COVID negative, urinalysis negative nitrite with urine culture no growth, CK 19, hemoglobin A1c 7.4, WBC 26,600, creatinine 2.23 with baseline 2.04 in late August however reported 09/23/2018 creatinine 1.56. Chest x-ray negative, recent renal ultrasound showed some increased cortical echogenicity and mild cortical thinning bilaterally bilateral complex and simple appearing renal cyst. Normal appearance of the urinary bladder. Patient was placed on gentle IV fluids. Placed on low-dose prednisone for suspected gout left foot with uric acid level within normal limits sedimentation rate mildly elevated 26. Leukocytosis felt to be related to recent Granix injection. Subcutaneous Lovenox for DVT prophylaxis. Renal function stabilizing 1.99 creatinine as well as WBC trending down to 20,000. Therapy evaluations completed patient was admitted for a comprehensive rehab program.  Current Status:  Patient was oriented and appropriate in his interactions with good expressive language skills.  Patient did not initiate any physical activity during visit so hard to assess motor functions.  Patient denies any significant mood disorder symptoms and was rather direct and mater of fact when discussing wife's status.  Patient with well retained intellectual abilities but attention and problem solving issues  remain.  These are likely due to two  prior frontal lobe insults.  His long history of not initiating much physical activity likely impacted by frontal lobe/executive functioning deficits.    Behavioral Observation: Cleatus Gabriel.  presents as a 78 y.o.-year-old Right Caucasian Male who appeared his stated age. his dress was Appropriate and he was Well Groomed and his manners were Appropriate to the situation.  his participation was indicative of Appropriate and Redirectable behaviors.  There were any physical disabilities noted.  he displayed an appropriate level of cooperation and motivation.     Interactions:    Active Appropriate and Redirectable  Attention:   abnormal and attention span appeared shorter than expected for age  Memory:   abnormal; retrieval of new information impacted by attentional deficits.  Patient has taken Ritalin to help with attention/concentration.    Visuo-spatial:  not examined  Speech (Volume):  low  Speech:   normal; normal  Thought Process:  Coherent and Tangential  Though Content:  WNL; not suicidal and not homicidal  Orientation:   person, place and time/date  Judgment:   Fair  Planning:   Poor  Affect:    Appropriate  Mood:    Euthymic  Insight:   Fair  Intelligence:   very high  Marital Status/Living: Patient living in independent living with wife.  Medical History:   Past Medical History:  Diagnosis Date  . Anginal pain ([place])   . Arthritis    mostly hands  . Benign familial hematuria   . BPH (benign prostatic hypertrophy)   . Brain cancer ([place])    melanoma with brain met  . Coronary artery disease   . GERD (gastroesophageal reflux disease)   . High cholesterol   . Hypertension   . Hypothyroidism   . Intracerebral hemorrhage ([place])   . Melanoma ([place])   . Metastatic melanoma to lung ([place])   . Mood disorder ([place])   . Myocardial infarction ([place])   . Pacemaker    due to syncope, 3rd degree HB (upgrade to [facility]. Jude CRT-P  02/25/13 (Dr. Uvaldo Rising)  . Rectal bleed    due to NSAIDS  . Renal disorder   . Sepsis ([place]) 12/09/2018  . Skin cancer    melanoma  . Sleep apnea    uses CPAP  . Stroke [facility])      Psychiatric History:  Patient with no prior psychiatric history.  Family Med/Psych History:  Family History  Problem Relation Age of Onset  . Cancer Mother 14       lung   . Hypertension Mother   . Hypertension Father   . Heart attack Father   . Heart disease Father   . Rheum arthritis Sister   . Cancer Maternal Grandmother     Impression/DX:  [name], [name]. Is a 78 year old right handed male with history of nontraumatic ICH secondary to hypertensive stroke/crisis affecting right side motor function in 2017.  Patient with history of prostate cancer, melanoma with brain mets diagnosed 2017 status post left frontal craniotomy, tumor excision and radiation therapy to the brain and chemotherapy complicated by leukopenia, thrombocytopenia.  Patient with numerous other medical issues and wife with dementia with both living in independent living facility.    Patient was oriented and appropriate in his interactions with good expressive language skills.  Patient did not initiate any physical activity during visit so hard to assess motor functions.  Patient denies any significant mood disorder symptoms and was rather direct and mater of fact when discussing wife's status.  Patient with well retained intellectual abilities but attention and problem solving issues remain.  These are likely due to two prior frontal lobe insults.  His long history of not initiating much physical activity likely impacted by frontal lobe/executive functioning deficits.    Disposition/Plan:  Patient will likely need external prompts and direction once discharged to continue with movement and physical activity.  Patient with residual frontal lobe deficits.  Attention and self direction/executive function deficits.  Diagnosis:    Cognitive  and Neurobehavioral dysfunction due to brain injury       Electronically Signed   _______________________ Ilean Skill, Psy.D.

## 2018-12-25 NOTE — Progress Notes (Signed)
[facility] PHYSICAL MEDICINE & REHABILITATION PROGRESS NOTE   Subjective/Complaints:   Pt reports jaw pain " not bad" said dentist didn't find a tooth cause, even on xray- only saw him once.  Feet and L hand much better.    ROS Neg CP, SOB N/V/D   Objective:   Dg Orthopantogram  Result Date: 12/23/2018 CLINICAL DATA:  Acute pulpitis.  Lower right jaw pain EXAM: ORTHOPANTOGRAM/PANORAMIC COMPARISON:  None. FINDINGS: No evidence of periapical abscess or acute bony abnormality. IMPRESSION: No acute findings. Electronically Signed   By: Rolm Baptise M.D.   On: 12/23/2018 09:56   Recent Labs    12/23/18 0633 12/25/18 0617  WBC 2.4* 1.1*  HGB 9.9* 10.5*  HCT 28.6* 30.4*  PLT 149* 181   Recent Labs    12/23/18 0633 12/25/18 0617  NA 134* 133*  K 4.8 5.3*  CL 103 102  CO2 21* 23  GLUCOSE 165* 144*  BUN 74* 74*  CREATININE 1.96* 1.66*  CALCIUM 8.6* 8.8*    Intake/Output Summary (Last 24 hours) at 12/25/2018 0911 Last data filed at 12/25/2018 0859 Gross per 24 hour  Intake 600 ml  Output 2200 ml  Net -1600 ml     Physical Exam: Vital Signs Blood pressure 116/79, pulse 76, temperature 97.6 F (36.4 C), temperature source Oral, resp. rate 18, height 5\' 9"  (1.753 m), weight 92.2 kg, SpO2 99 %.  Neurological:  Reviewed vitals, nursing notes . Pt awake, sitting up in bed; watching TV; commenting on politics, brighter affect, NAD Mood and affect are appropriate; brighter affect Heart: Regular rate and rhythm  Lungs: Clear to auscultation B/L Abdomen: Positive bowel sounds, soft, NT, ND Extremities: no significant swelling or redness of feet and L 3rd MCP on hand- minimal warmth, if at all in feet as well- pt had no TTP over B/L feet or L hand Neurologic:  motor strength is 5/5 in bilateral deltoid, bicep, tricep, grip, 4/5 Bilateral hip flexor, knee extensors, ankle dorsiflexor and plantar flexor No skin breakdown, rash on face/nose  Oral cavity- No TTP  Right  lower jaw   Assessment/Plan: 1. Functional deficits secondary to Debility with recent cellulitis and gout flare x2- previous and current which require 3+ hours per day of interdisciplinary therapy in a comprehensive inpatient rehab setting.  Physiatrist is providing close team supervision and 24 hour management of active medical problems listed below.  Physiatrist and rehab team continue to assess barriers to discharge/monitor patient progress toward functional and medical goals  Care Tool:  Bathing    Body parts bathed by patient: Right arm, Left arm, Chest, Abdomen, Front perineal area, Right upper leg, Left upper leg, Face, Buttocks, Right lower leg, Left lower leg   Body parts bathed by helper: Right lower leg, Left lower leg     Bathing assist Assist Level: Contact Guard/Touching assist     Upper Body Dressing/Undressing Upper body dressing   What is the patient wearing?: Pull over shirt    Upper body assist Assist Level: Set up assist    Lower Body Dressing/Undressing Lower body dressing      What is the patient wearing?: Underwear/pull up, Pants     Lower body assist Assist for lower body dressing: Contact Guard/Touching assist     Toileting Toileting    Toileting assist Assist for toileting: Contact Guard/Touching assist     Transfers Chair/bed transfer  Transfers assist     Chair/bed transfer assist level: Contact Guard/Touching assist     Locomotion  Ambulation   Ambulation assist      Assist level: Contact Guard/Touching assist Assistive device: Walker-rolling Max distance: 120'   Walk 10 feet activity   Assist     Assist level: Contact Guard/Touching assist Assistive device: Walker-rolling   Walk 50 feet activity   Assist Walk 50 feet with 2 turns activity did not occur: Safety/medical concerns  Assist level: Contact Guard/Touching assist Assistive device: Walker-rolling    Walk 150 feet activity   Assist Walk 150 feet  activity did not occur: Safety/medical concerns         Walk 10 feet on uneven surface  activity   Assist Walk 10 feet on uneven surfaces activity did not occur: Safety/medical concerns         Wheelchair     Assist Will patient use wheelchair at discharge?: Yes Type of Wheelchair: Manual    Wheelchair assist level: Minimal Assistance - Patient > 75% Max wheelchair distance: 150'    Wheelchair 50 feet with 2 turns activity    Assist    Wheelchair 50 feet with 2 turns activity did not occur: Safety/medical concerns   Assist Level: Minimal Assistance - Patient > 75%   Wheelchair 150 feet activity     Assist  Wheelchair 150 feet activity did not occur: Safety/medical concerns   Assist Level: Minimal Assistance - Patient > 75%   Blood pressure 116/79, pulse 76, temperature 97.6 F (36.4 C), temperature source Oral, resp. rate 18, height 5\' 9"  (1.753 m), weight 92.2 kg, SpO2 99 %.  Medical Problem List and Plan: 1.Debilitysecondary to generalized weakness related to recent cellulitis with gout flareup as well as history of malignant melanoma with metastasis to the brain Cont CIR PT, OT, SLP  2. Antithrombotics: -DVT/anticoagulation:Lovenox -antiplatelet therapy: Aspirin 81 mg daily 3. Pain Management:Tylenol as needed 4. Mood:Celexa 10 mg daily. Provide emotional support  9/14- added Ritalin yesterday for attention; however will see in next few days if will increase Celexa. -antipsychotic agents: N/A 5. Neuropsych: This patientis ? capable of making decisions on hisown behalf. 6. Skin/Wound Care:Routine skin checks 7. Fluids/Electrolytes/Nutrition:Routine in and outs with follow-up chemistries  9/16- K+ 5.3- will recheck in AM 8.t foot pain and swelling related to recurrent  gout. restart prednisone 20mg  per day x 5 d 9. Stage IV malignant melanoma metastatic disease to the brain and lung as well as history of  nontraumatic intracerebral hemorrhage with right hemiparesis. Status post craniotomy 2017 with chemotherapy as well as radiation. Followed by Dr. Burr Medico as well as Dr. Mickeal Skinner 10. Leukocytosis resolved  9/16- WBC down to 1.1- will recheck tomorrow, and if still low, will call Heme/Onc 11. Diabetes mellitus. Hemoglobin A1c 7.4. increase Glucotrol 2.5 mg BID  CBG (last 3)  Recent Labs    12/24/18 1713 12/24/18 2105 12/25/18 0644  GLUCAP 268* 345* 140*  some lability , monitor for pattern- expect increase with prednisone   9/15- better controlled than expected considering prednisone increase 12. Hypertension. Norvasc 5 mg daily, Toprol 75 mg twice daily. Monitor with increased mobility Vitals:   12/24/18 2013 12/25/18 0649  BP: 119/70 116/79  Pulse: 76 76  Resp: 18 18  Temp: 99.3 F (37.4 C) 97.6 F (36.4 C)  SpO2: 97% 99%  controlled no brady  13. AKI superimposed on CKD stage III. Creatinine stabilizing after gentle IV fluids. Follow-up chemistries  9/10- Cr down to 1.67 and Bun down to 63 from 70  9/11- will recheck Monday  9/14- Cr up to 1.96 (per  daughter baseline is 1.5) and BUN up to 74- will push PO fluids- wait on IVFs- will recheck Wednesday.  9/15- daughter brought in sparkling water- encouraged pt to drink more again.  9/15- Cr down to 1.66- now baseline and BUN 74- stable.    18. CAD with CABG. Continue aspirin therapy no chest pain 15. Hypothyroidism. Synthroid 16. Hyperlipidemia. Crestor  17. Toothache  Exam negative and infact pt not complaining this am  18.  Elevated temp x1 UA neg, WBC normal, BC sent ? Atelectasis due to bedrest vs gout related order IS (incentive spirometer) 19. Attention- on Ritalin now 20. Gout-   9/14- increased Prednisone to 50 mg day x 3 days then 40 mg day x 3 days then 20 mg x 3 days then 10 mg x3days- a longer taper might impact his BGs, however he and daughter agree that might stop him from continuing to get gout  flares.  9/15- much improved today per pt and therapist- on exam, much improved- will con't slower taper.  9/16- doing great- Sx's almost completely resolved  Dental pain- intermittent ,dental consult ordered per family request, daughter mentioned prior hx of trigeminal neuralgia on opposite side (left) , no facial skin rash noted, pt does have beard  9/16- likely not dental- could be trigeminal neuralgia on opposite side- doing better as well with steroids.  LOS: 7 days A FACE TO FACE EVALUATION WAS PERFORMED  David [name] 12/25/2018, 9:11 AM

## 2018-12-25 NOTE — Progress Notes (Signed)
Daughter voiced that patient has been complaining of worsening foot pain again which started ramping up this afternoon.  Patient has not mentioned his foot pain to me, but I see in therapy notes that it is impacting his mobility.  Daughter wants to make sure Dr. Dagoberto Ligas is aware that the foot pain is getting worse again since patient scheduled for discharge on Saturday.  Brita Romp, RN

## 2018-12-25 NOTE — Patient Care Conference (Signed)
Inpatient RehabilitationTeam Conference and Plan of Care Update Date: 12/25/2018   Time: 9:30 AM    Patient Name: David [name].      Medical Record Number: [id number]  Date of Birth: January 30, 1941 Sex: Male         Room/Bed: 4M02C/4M02C-01 Payor Info: Payor: MEDICARE / Plan: MEDICARE PART A AND B / Product Type: *No Product type* /    Admit Date/Time:  12/18/2018  4:52 PM  Primary Diagnosis:  Debility  Patient Active Problem List   Diagnosis Date Noted  . Acute gout due to renal impairment involving multiple sites   . Cellulitis   . Debility 12/18/2018  . AKI (acute kidney injury) ([place]) 12/17/2018  . DM type 2 (diabetes mellitus, type 2) ([place]) 12/17/2018  . Weakness generalized 12/16/2018  . Altered mental status 12/09/2018  . Sepsis ([place]) 12/09/2018  . Fever 12/09/2018  . Neutropenia with fever ([place]) 12/09/2018  . Thrombocytopenia ([place]) 12/09/2018  . Acute pain of left wrist 11/15/2018  . Insomnia 10/14/2018  . Heart block AV complete ([place]) 10/01/2018  . Presence of biventricular cardiac pacemaker 10/01/2018  . Hypogonadism in male 04/18/2018  . Dandruff in adult 03/27/2018  . Dyslipidemia 03/27/2018  . Acute pain of right shoulder 03/11/2018  . Elevated PSA 12/24/2017  . Prediabetes 12/24/2017  . Hx of CABG 07/17/2017  . Alcohol use 04/19/2017  . Cognitive and neurobehavioral dysfunction following brain injury ([place]) 02/25/2017  . History of implantation of joint prosthesis of elbow 02/12/2017  . Osteoarthritis of knee 02/12/2017  . Hypercalcemia 11/01/2016  . Nontraumatic cortical hemorrhage of left cerebral hemisphere ([place]) 06/27/2016  . Hematochezia 05/06/2016  . Advanced care planning/counseling discussion 05/03/2016  . Atherosclerosis of native coronary artery of native heart with stable angina pectoris ([place]) 04/24/2016  . Ischemic cardiomyopathy 04/24/2016  . Acquired hypothyroidism 11/24/2015  . Metastatic melanoma to lung ([place])   . MDD (major depressive  disorder), recurrent episode, moderate ([place])   . Benign prostatic hyperplasia   . Metastasis to brain ([place]) 10/15/2015  . Malignant melanoma ([place]) 09/22/2015  . Lung mass   . Chronic kidney disease, stage III (moderate) ([facility])   . Overweight with body mass index (BMI) 25.0-29.9 08/26/2015  . Incontinence 08/26/2015  . Hemorrhagic stroke ([place]) 08/26/2015  . Hemiplegia and hemiparesis following nontraumatic intracerebral hemorrhage affecting right dominant side ([place]) 08/26/2015  . Aphasia following nontraumatic intracerebral hemorrhage 08/26/2015  . Gait disturbance, post-stroke 08/26/2015  . Benign essential HTN   . OSA on CPAP   . Biventricular ICD (implantable cardioverter-defibrillator) in place   . ICH (intracerebral hemorrhage) ([place]) - L frontal due to HTN vs CAA 08/22/2015    Expected Discharge Date: Expected Discharge Date: 12/28/18  Team Members Present: Physician leading conference: Dr. Courtney Heys Social Worker Present: Alfonse Alpers, LCSW Nurse Present: Rayetta Pigg, RN PT Present: Excell Seltzer, PT OT Present: Amy Rounds, OT SLP Present: Stormy Fabian, SLP PPS Coordinator present : Gunnar Fusi, SLP     Current Status/Progress Goal Weekly Team Focus  Bowel/Bladder   Continent of bowel and bladder; LBM 9/15  Remain continent with mod I assist  Assess and treat for constipation or incontinence.   Swallow/Nutrition/ Hydration             ADL's   CGA overall using RW  Supervision overall  ADL re-training, functional mobility and transfers, family education and d/c planning   Mobility   Supervision to CGA overall, gait x 120' with RW CGA  Supervision  endurance, transfers, gait training   Communication             Safety/Cognition/ Behavioral Observations  Approaching supervision with semi-complex cognition  supervision  semi-complex problem solving, selective attention, anticipatory awareness   Pain   C/o pain in bilateral feet that is improving since the  start of the steroids.  < 3  Assess and treat for pain q shift and prn   Skin   Generalized bruising noted.  MASD to scrotum- currently using barrier cream  Maintain skin integrity with mod I assist  Assess skin q shift and prn    Rehab Goals Patient on target to meet rehab goals: Yes Rehab Goals Revised: none *See Care Plan and progress notes for long and short-term goals.     Barriers to Discharge  Current Status/Progress Possible Resolutions Date Resolved   Nursing                  PT  Decreased caregiver support                 OT                  SLP                SW                Discharge Planning/Teaching Needs:  Pt plans to return to his Independent Living apartment at [facility] with paid caregivers and family to provide supervision.  Family education will be offered closer to d/c, as needed.   Team Discussion:  Dr. Dagoberto Ligas is watching pt's labs, especially WBC and potassium.  She asked team to encourage him to drink fluids.  His gout is improving with steroids and is now tapering those.  Pt is doing well with daily Miralax for bowels.  Nursing has asked if trazodone could be increased as pt is not sleeping well and if insulin sliding scale at night could be added due to increased blood sugars.  Pt is doing great with OT at close supervision/CGA overall with cueing for safety with the RW.  Pt is walking with PT up to 120' with RW and making good progress.  ST picked pt up last week, but feels pt is close to baseline.  Revisions to Treatment Plan:  none    Medical Summary Current Status: WBC 1.1, K+ 5.3 Weekly Focus/Goal: improve labs  Barriers to Discharge: Behavior;Home enviroment access/layout;Incontinence;Medical stability;Other (comments)  Barriers to Discharge Comments: gout Possible Resolutions to Barriers: treat gout   Continued Need for Acute Rehabilitation Level of Care: The patient requires daily medical management by a physician with specialized training  in physical medicine and rehabilitation for the following reasons: Direction of a multidisciplinary physical rehabilitation program to maximize functional independence : Yes Medical management of patient stability for increased activity during participation in an intensive rehabilitation regime.: Yes Analysis of laboratory values and/or radiology reports with any subsequent need for medication adjustment and/or medical intervention. : Yes   I attest that I was present, lead the team conference, and concur with the assessment and plan of the team.Team conference was held via web/ teleconference due to [place] - 19.   Sintia Mckissic, [name] 12/25/2018, 3:30 PM

## 2018-12-25 NOTE — Progress Notes (Signed)
Occupational Therapy Session Note  Patient Details  Name: David [name]. MRN: [id number] Date of Birth: 1940/08/10  Today's Date: 12/25/2018 OT Individual Time: 1100-1153 OT Individual Time Calculation (min): 53 min    Short Term Goals: Week 1:  OT Short Term Goal 1 (Week 1): Pt will be able to sit to stand with min A to prepare for clothing management with LB self care and toileting. OT Short Term Goal 2 (Week 1): Pt will be able to stand with min A to adjust clothing over hips. OT Short Term Goal 3 (Week 1): Pt will be able to don shirt with set up. OT Short Term Goal 4 (Week 1): Pt will be able to don pants with mod A. OT Short Term Goal 5 (Week 1): Pt will be able to transfer to the toilet with min A.  Skilled Therapeutic Interventions/Progress Updates:    Pt seen for OT session focusing on functional mobility, ADL re-training, education and dunctional standing balance/endurance. Pt in supine upon arrival, agreeable to tx session. Voiced complaints of mild pain in b feet, however, declined need for intervention and willing to participate as able.  He ambulated throughout session with RW and supervision, occasional VCs for RW management in funcional context.  Completed toileting task with close supervision, lateral leans for buttock hygiene and use of grab bars throughout for steadying and assisting in power self up into standing. He ambluated out of bathroom and completed hand hygiene standing at sink. He ambulated to ADL apartment. Completed sit<>stand from low soft surface couch with guarding assist.  While pt seated on couch, extensive education/discussion regarding d/c planning, return home and modified ADLs/IADLs now that he is using RW. Pt displays poor insight into deficits and poor problem solving when hypothetical ADL/IADL scenarios presented. Therefore, belive pt will benefit from 24 hr supervision assist at d/c. He did recall use of tub transfer bench discussed yesterday  for showering task and pt agreeable to recommendation for use of HH urinal for night time toileting tasks. Education also provided regarding benefits/importance of participation and independce with ADLs.   In therapy gym, completed x3 sets of 5 sit>stand without UE support for LE strengthening. Completed with guarding assist and rest breaks btwn trials.  Dynamic standing activity of standing horseshoe toss. Completed without use of AD, HHA provided. Completed x2 trials on tile and then x2 trials standing on foam non-compliant surface with seated rest breaks btwn trials.  Pt returned to room at end of session, transitioned to recliner. Left seated in recliner with all needs in reach and chair pad alarm on.   Therapy Documentation Precautions:  Precautions Precautions: Fall Restrictions Weight Bearing Restrictions: No Other Position/Activity Restrictions: B foot pain from gout/cellulitis   Therapy/Group: Individual Therapy  Mehmet Scally L 12/25/2018, 7:09 AM

## 2018-12-25 NOTE — Progress Notes (Signed)
Patient home unit CPAP set up. Humidity provided. No complications noted. Patient places self on his own mask.

## 2018-12-25 NOTE — Progress Notes (Signed)
Physical Therapy Session Note  Patient Details  Name: David [name]. MRN: [id number] Date of Birth: Jun 24, 1940  Today's Date: 12/25/2018 PT Individual Time: 1300-1340 PT Individual Time Calculation (min): 40 min   Short Term Goals: Week 1:  PT Short Term Goal 1 (Week 1): Pt will perform sit<>stand at min A level with mod cues for technique. PT Short Term Goal 2 (Week 1): Pt will propel w/c with BUEs and/or LEs x 50' at S level. PT Short Term Goal 3 (Week 1): Pt will ambulate x 50' with RW at min A level Week 2:    Week 3:     Skilled Therapeutic Interventions/Progress Updates:  Pain:  5/10 bilat feet due to gout.  Treatment to tolerance.  Pt initially oob in recliner.  Sit to stand from recliner w/CGA to RW.  Gait 13ft w/cga, shuffling gait due to discomfort/feet.   Pt transported to gym in wc by PT  Sit to stand from wc w/cga to RW. Gait 131ft w/RW and cga, slow cadence, poor clearance/shuffling gait.   wc to NuStep via stand pivot w/min assist.   Sit to stand from wc repeated x 3 for LE strengthening.   NuStep L3 x 97min decreased to L1 x 3 min for general strengthening and cardiovascular endurance, RPE 5/10 iindicating appropriate level for cardiovascular conditioning. Nustep to wc via stand pivot w/min assist.   wc to bed via stand pivot w/set up and min assist without AD. Sit to supine using bedrails w/cga.  Pt left supine in bed w/needs in reach, bed alarm set.   Therapy Documentation Precautions:  Precautions Precautions: Fall Restrictions Weight Bearing Restrictions: No Other Position/Activity Restrictions: B foot pain from gout/cellulitis    Therapy/Group: Individual Therapy  Jerrilyn Cairo 12/25/2018, 2:05 PM

## 2018-12-26 ENCOUNTER — Inpatient Hospital Stay (HOSPITAL_COMMUNITY): Payer: Medicare Other | Admitting: Occupational Therapy

## 2018-12-26 ENCOUNTER — Inpatient Hospital Stay (HOSPITAL_COMMUNITY): Payer: Medicare Other | Admitting: Speech Pathology

## 2018-12-26 ENCOUNTER — Ambulatory Visit: Payer: Medicare Other

## 2018-12-26 ENCOUNTER — Inpatient Hospital Stay (HOSPITAL_COMMUNITY): Payer: Medicare Other

## 2018-12-26 DIAGNOSIS — I10 Essential (primary) hypertension: Secondary | ICD-10-CM

## 2018-12-26 DIAGNOSIS — R739 Hyperglycemia, unspecified: Secondary | ICD-10-CM

## 2018-12-26 DIAGNOSIS — G3189 Other specified degenerative diseases of nervous system: Secondary | ICD-10-CM

## 2018-12-26 DIAGNOSIS — N183 Chronic kidney disease, stage 3 (moderate): Secondary | ICD-10-CM

## 2018-12-26 DIAGNOSIS — N179 Acute kidney failure, unspecified: Secondary | ICD-10-CM

## 2018-12-26 DIAGNOSIS — T380X5A Adverse effect of glucocorticoids and synthetic analogues, initial encounter: Secondary | ICD-10-CM

## 2018-12-26 DIAGNOSIS — E1165 Type 2 diabetes mellitus with hyperglycemia: Secondary | ICD-10-CM

## 2018-12-26 DIAGNOSIS — S069X0S Unspecified intracranial injury without loss of consciousness, sequela: Secondary | ICD-10-CM

## 2018-12-26 DIAGNOSIS — F09 Unspecified mental disorder due to known physiological condition: Secondary | ICD-10-CM

## 2018-12-26 LAB — CBC WITH DIFFERENTIAL/PLATELET
Abs Immature Granulocytes: 0.06 10*3/uL (ref 0.00–0.07)
Basophils Absolute: 0 10*3/uL (ref 0.0–0.1)
Basophils Relative: 0 %
Eosinophils Absolute: 0 10*3/uL (ref 0.0–0.5)
Eosinophils Relative: 0 %
HCT: 29 % — ABNORMAL LOW (ref 39.0–52.0)
Hemoglobin: 10.5 g/dL — ABNORMAL LOW (ref 13.0–17.0)
Immature Granulocytes: 7 %
Lymphocytes Relative: 25 %
Lymphs Abs: 0.2 10*3/uL — ABNORMAL LOW (ref 0.7–4.0)
MCH: 33.8 pg (ref 26.0–34.0)
MCHC: 36.2 g/dL — ABNORMAL HIGH (ref 30.0–36.0)
MCV: 93.2 fL (ref 80.0–100.0)
Monocytes Absolute: 0 10*3/uL — ABNORMAL LOW (ref 0.1–1.0)
Monocytes Relative: 5 %
Neutro Abs: 0.5 10*3/uL — ABNORMAL LOW (ref 1.7–7.7)
Neutrophils Relative %: 63 %
Platelets: 153 10*3/uL (ref 150–400)
RBC: 3.11 MIL/uL — ABNORMAL LOW (ref 4.22–5.81)
RDW: 14.1 % (ref 11.5–15.5)
WBC: 0.8 10*3/uL — CL (ref 4.0–10.5)
nRBC: 0 % (ref 0.0–0.2)

## 2018-12-26 LAB — COMPREHENSIVE METABOLIC PANEL
ALT: 51 U/L — ABNORMAL HIGH (ref 0–44)
AST: 25 U/L (ref 15–41)
Albumin: 2.7 g/dL — ABNORMAL LOW (ref 3.5–5.0)
Alkaline Phosphatase: 49 U/L (ref 38–126)
Anion gap: 11 (ref 5–15)
BUN: 76 mg/dL — ABNORMAL HIGH (ref 8–23)
CO2: 21 mmol/L — ABNORMAL LOW (ref 22–32)
Calcium: 8.4 mg/dL — ABNORMAL LOW (ref 8.9–10.3)
Chloride: 100 mmol/L (ref 98–111)
Creatinine, Ser: 1.76 mg/dL — ABNORMAL HIGH (ref 0.61–1.24)
GFR calc Af Amer: 42 mL/min — ABNORMAL LOW (ref >60)
GFR calc non Af Amer: 36 mL/min — ABNORMAL LOW (ref >60)
Glucose, Bld: 124 mg/dL — ABNORMAL HIGH (ref 70–99)
Potassium: 5.1 mmol/L (ref 3.5–5.1)
Sodium: 132 mmol/L — ABNORMAL LOW (ref 135–145)
Total Bilirubin: 0.5 mg/dL (ref 0.3–1.2)
Total Protein: 5.4 g/dL — ABNORMAL LOW (ref 6.5–8.1)

## 2018-12-26 LAB — CULTURE, BLOOD (ROUTINE X 2)
Culture: NO GROWTH
Culture: NO GROWTH
Special Requests: ADEQUATE
Special Requests: ADEQUATE

## 2018-12-26 LAB — GLUCOSE, CAPILLARY
Glucose-Capillary: 125 mg/dL — ABNORMAL HIGH (ref 70–99)
Glucose-Capillary: 120 mg/dL — ABNORMAL HIGH (ref 70–99)
Glucose-Capillary: 317 mg/dL — ABNORMAL HIGH (ref 70–99)
Glucose-Capillary: 254 mg/dL — ABNORMAL HIGH (ref 70–99)

## 2018-12-26 MED ORDER — BLOOD GLUCOSE METER KIT: PACK | 0 refills | Status: DC

## 2018-12-26 MED ORDER — LIVING WELL WITH DIABETES BOOK
Freq: Once | Status: AC
  Administered 2018-12-26: 18:00:00
  Filled 2018-12-26: qty 1

## 2018-12-26 MED ORDER — GABAPENTIN 100 MG PO CAPS
100.0000 mg | ORAL_CAPSULE | Freq: Two times a day (BID) | ORAL | Status: DC
  Administered 2018-12-26 – 2018-12-28 (×5): 100 mg via ORAL
  Filled 2018-12-26 (×5): qty 1

## 2018-12-26 NOTE — Plan of Care (Signed)
  Problem: Consults Goal: RH GENERAL PATIENT EDUCATION Description: See Patient Education module for education specifics. Outcome: Progressing Goal: Diabetes Guidelines if Diabetic/Glucose > 140 Description: If diabetic or lab glucose is > 140 mg/dl - Initiate Diabetes/Hyperglycemia Guidelines & Document Interventions  Outcome: Progressing   Problem: RH BOWEL ELIMINATION Goal: RH STG MANAGE BOWEL WITH ASSISTANCE Description: STG Manage Bowel with mod I Assistance. Outcome: Progressing   Problem: RH BLADDER ELIMINATION Goal: RH STG MANAGE BLADDER WITH ASSISTANCE Description: STG Manage Bladder With mod I.Assistance Outcome: Progressing   Problem: RH SKIN INTEGRITY Goal: RH STG SKIN FREE OF INFECTION/BREAKDOWN Description: With mod I Assist. Outcome: Progressing Goal: RH STG MAINTAIN SKIN INTEGRITY WITH ASSISTANCE Description: STG Maintain Skin Integrity With mod I Assistance. Outcome: Progressing   Problem: RH SAFETY Goal: RH STG ADHERE TO SAFETY PRECAUTIONS W/ASSISTANCE/DEVICE Description: STG Adhere to Safety Precautions With mod I Assistance/Device. Outcome: Progressing   Problem: RH PAIN MANAGEMENT Goal: RH STG PAIN MANAGED AT OR BELOW PT'S PAIN GOAL Description: Less than 3. Outcome: Progressing

## 2018-12-26 NOTE — Progress Notes (Signed)
Oncology brief note   I have reviewed his chart and lab results.  He has developed neutropenia again, no active infections.  I spoke with Dr. Posey Pronto, he is making progress on rehab, and the plan is to be discharged home this Saturday.   I will schedule a bone marrow biopsy in our clinic in the next 1 to 2 weeks.  G-CSF may interfere with the interpretation of bone marrow biopsy, and he does not have active infection, I will hold G-CSF for now.  Continue neutropenic precaution (no isolation precaution needed).  David [name]  12/26/2018

## 2018-12-26 NOTE — Discharge Summary (Signed)
Physician Discharge Summary  Patient ID: David Jay Figiel Jr. MRN: [id number] DOB/AGE: 05/16/1940 78 y.o.  Admit date: 12/18/2018 Discharge date: 12/28/2018  Discharge Diagnoses:  Principal Problem:   Debility Active Problems:   Benign essential HTN   OSA on CPAP   Chronic kidney disease, stage III (moderate) (HCC)   Cognitive and neurobehavioral dysfunction following brain injury (HCC)   Acute gout due to renal impairment involving multiple sites   Cellulitis   Controlled type 2 diabetes mellitus with hyperglycemia, without long-term current use of insulin (HCC) Stage IV malignant melanoma metastatic disease to the brain and lung as well as history of nontraumatic intracerebral hemorrhage with right hemiparesis status post craniotomy 2017 with chemotherapy and radiation Leukopenia CAD with CABG Hypothyroidism Hyperlipidemia Toothache Recurrent gout  Discharged Condition: Stable  Significant Diagnostic Studies: Dg Orthopantogram  Result Date: 12/23/2018 CLINICAL DATA:  Acute pulpitis.  Lower right jaw pain EXAM: ORTHOPANTOGRAM/PANORAMIC COMPARISON:  None. FINDINGS: No evidence of periapical abscess or acute bony abnormality. IMPRESSION: No acute findings. Electronically Signed   By: Kevin  Dover M.D.   On: 12/23/2018 09:56   Dg Wrist Complete Left  Result Date: 12/10/2018 CLINICAL DATA:  Chronic left wrist pain without known injury. EXAM: LEFT WRIST - COMPLETE 3+ VIEW COMPARISON:  None. FINDINGS: There is no evidence of fracture or dislocation. Severe degenerative changes seen involving the first carpometacarpal joint with osteophyte formation. Soft tissues are unremarkable. IMPRESSION: Severe osteoarthritis of the first carpometacarpal joint. No acute abnormality seen in the left wrist. Electronically Signed   By: James  Green Jr M.D.   On: 12/10/2018 14:46   Ct Head Wo Contrast  Result Date: 12/09/2018 CLINICAL DATA:  Altered level of consciousness, sepsis EXAM: CT HEAD WITHOUT  CONTRAST TECHNIQUE: Contiguous axial images were obtained from the base of the skull through the vertex without intravenous contrast. COMPARISON:  Most recent comparison CT September 18, 2018 FINDINGS: Brain: A previously described 4 mm enhancing lesion in the right cerebellum is not well seen in the absence of contrast on today's study. There is a redemonstrated focus of encephalomalacia in the anterior left frontal lobe with associated ex vacuo dilatation of the anterior horn of the left lateral ventricle. No new intracranial lesions are seen. No evidence of acute infarction, hemorrhage, hydrocephalus, extra-axial collection or mass lesion/mass effect. Symmetric prominence of the ventricles, cisterns and sulci compatible with parenchymal volume loss. Patchy areas of white matter hypoattenuation are most compatible with chronic microvascular angiopathy. Vascular: Atherosclerotic calcification of the carotid siphons and intradural vertebral arteries. Skull: Remote left pterional craniotomy changes are again seen. Few prominent venous lakes are unchanged from prior. No calvarial fracture or suspicious osseous lesion. No scalp swelling or hematoma. Sinuses/Orbits: Minimal mural thickening in the left maxillary, sphenoid and ethmoid sinuses. Some pneumatized secretions are present within the ethmoids as well. Bilateral lens extractions are noted. Left scleral buckle is seen. Orbits are otherwise unremarkable. Other: None IMPRESSION: 1. No acute intracranial abnormality. 2. A previously described 4 mm enhancing lesion in the right cerebellum is not well seen in the absence of contrast on today's study. No new intracranial lesions are seen. Electronically Signed   By: Price  DeHay M.D.   On: 12/09/2018 02:29   Nm Bone Scan Whole Body  Result Date: 12/11/2018 CLINICAL DATA:  History of prostate cancer. EXAM: NUCLEAR MEDICINE WHOLE BODY BONE SCAN TECHNIQUE: Whole body anterior and posterior images were obtained approximately  3 hours after intravenous injection of radiopharmaceutical. RADIOPHARMACEUTICALS:  21.7 mCi Technetium-99m MDP   IV COMPARISON:  PET scan of September 18, 2018. Radiographs of November 26, 2017. FINDINGS: Minimal abnormal uptake is seen involving both feet consistent with degenerative change. Abnormal uptake is seen involving the right glenohumeral joint suggesting degenerative change. Degenerative changes seen involving the left knee. No other areas of abnormal uptake are noted. IMPRESSION: No definite scintigraphic evidence of osseous metastases. Electronically Signed   By: Marijo Conception M.D.   On: 12/11/2018 15:19   US Renal  Result Date: 12/11/2018 CLINICAL DATA:  Acute renal insufficiency. EXAM: RENAL / URINARY TRACT ULTRASOUND COMPLETE COMPARISON:  CT of the abdomen December 09, 2018 FINDINGS: Right Kidney: Renal measurements: 11.1 x 6.2 x 5.7 cm = volume: 205 mL. Increased cortical echogenicity and mild cortical thinning. Complex cystic structure in the midpole region of the right kidney measures 2.7 x 2 cm. Additional 1 cm benign-appearing cyst in the upper pole of the right kidney. Left Kidney: Renal measurements: 13 x 5.6 x 6.3 cm = volume: 240 mL. Increased cortical echogenicity and mild cortical thinning. Simple appearing cyst in the midpole region of the left kidney measures 1.9 cm. Additional complex cystic structure in the lower pole of the left kidney measures 1.3 cm Bladder: Appears normal for degree of bladder distention. IMPRESSION: Increased cortical echogenicity and mild cortical thinning bilaterally. Bilateral complex and simple appearing renal cysts. Indention on future follow-up. Normal appearance of the urinary bladder. Electronically Signed   By: Fidela Salisbury M.D.   On: 12/11/2018 20:29   Dg Chest Port 1 View  Result Date: 12/16/2018 CLINICAL DATA:  Brought in from home by EMS for weakness. Pt was discharged several days ago for sepsis d/t cellulitis in right foot. Daughter states he  has gotten increasingly weak and yesterday started voiding large amounts. EXAM: PORTABLE CHEST 1 VIEW COMPARISON:  12/08/2018 FINDINGS: LEFT-sided transvenous pacemaker leads overlie the RIGHT atrium, coronary sinus, and RIGHT ventricle. Median sternotomy and CABG. The heart is normal in size. There are no focal consolidations or pleural effusions. Subsegmental atelectasis identified at the LEFT lung base. There is no pulmonary edema. IMPRESSION: No active disease. Electronically Signed   By: Nolon Nations M.D.   On: 12/16/2018 15:16   Dg Chest Port 1 View  Result Date: 12/09/2018 CLINICAL DATA:  Sepsis EXAM: PORTABLE CHEST 1 VIEW COMPARISON:  Radiograph 05/24/2017, CT chest 09/01/2015 FINDINGS: Postsurgical changes related to prior CABG including intact and aligned sternotomy wires and multiple surgical clips projecting over the mediastinum. Three lead pacer battery pack overlies the left chest wall with leads in the right atrium, cardiac apex and coronary sinus. Cardiac silhouette is enlarged accounting for the portable technique. The aorta is calcified. There is tortuosity of the right brachiocephalic vessels as well. Calcifications noted at the carotid bifurcations. Lung volumes are diminished. Streaky opacities are present in the lung bases with more focal right infrahilar opacity. Pulmonary vascularity is somewhat cephalized. IMPRESSION: Findings are compatible with CHF/volume overload with mild edema and cardiomegaly in this patient with history of CABG. Early infectious process could have a similar appearance, particularly in the lung bases, and should be excluded on a clinical basis. Electronically Signed   By: Lovena Le M.D.   On: 12/09/2018 00:16   Dg Foot 2 Views Left  Result Date: 12/16/2018 CLINICAL DATA:  Pain and swelling. EXAM: LEFT FOOT - 2 VIEW COMPARISON:  Left ankle radiograph Aug 27, 2015 FINDINGS: There is no evidence of fracture or dislocation. Arthritic changes at the first  metatarsophalangeal joint with  joint space narrowing, subchondral sclerosis, erosive changes and cyst formation. Milder arthritic changes with subchondral erosions of the first interphalangeal joint. Soft tissues are unremarkable. IMPRESSION: 1. No acute fracture or dislocation identified about the left foot. 2. Arthropathy at the first metatarsophalangeal and interphalangeal joints may represent inflammatory or osteoarthritis. Electronically Signed   By: Dobrinka  Dimitrova M.D.   On: 12/16/2018 18:29   Dg Foot Complete Right  Result Date: 12/09/2018 CLINICAL DATA:  Possible sepsis, redness on foot EXAM: RIGHT FOOT COMPLETE - 3+ VIEW COMPARISON:  None. FINDINGS: Postsurgical changes from prior first tarsometatarsal arthrodesis secured by 2 fully threaded screws. An additional fully threaded screw at the anterior process of the calcaneus, likely related to prior ORIF, correlate with surgical history. Marked hallux valgus deformity across the first metatarsophalangeal joint with hypertrophic change. There is remote posttraumatic change of the second proximal middle phalanges with posttraumatic fusion. Additional fusion is seen across the fourth middle and distal phalanges. There is diffuse soft tissue swelling and vascular calcification. No convincing features of osteomyelitis such as cortical destruction, periostitis or erosive change though evaluation is limited by the background of extensive degenerative features throughout the foot. There is a small linear radiodensity in the soft tissues adjacent the base of the fifth proximal phalanx which could reflect a small foreign body. No subcutaneous gas. IMPRESSION: 1. No convincing features of osteomyelitis though evaluation is limited by the background of extensive degenerative changes and postsurgical features throughout the foot. 2. Small linear radiodensity in the soft tissues adjacent to the base of the fifth proximal phalanx which could reflect a small  foreign body. Electronically Signed   By: Price  DeHay M.D.   On: 12/09/2018 00:22   Ct Renal Stone Study  Result Date: 12/09/2018 CLINICAL DATA:  Altered level of consciousness, sepsis EXAM: CT ABDOMEN AND PELVIS WITHOUT CONTRAST TECHNIQUE: Multidetector CT imaging of the abdomen and pelvis was performed following the standard protocol without IV contrast. COMPARISON:  Chest radiograph December 08, 2018, PET-CT September 18, 2018 FINDINGS: Lower chest: Minimal atelectatic changes in the lung bases. 3 lead pacer wires are better visualized on scout radiography. Normal heart size. No pericardial effusion. Hepatobiliary: No focal liver abnormality is seen. No gallstones, gallbladder wall thickening, or biliary dilatation. Pancreas: Mild atrophy of the pancreas. No peripancreatic inflammation or ductal dilatation. Spleen: Normal in size without focal abnormality. Adrenals/Urinary Tract: Normal adrenal glands. Bilateral mild renal atrophy with cortical scarring in both kidneys. Mild bilateral nonspecific perinephric stranding, a nonspecific finding though may correlate with either age or decreased renal function. No visible urolithiasis. No hydronephrosis. No concerning renal masses. Urinary bladder is unremarkable. Stomach/Bowel: Distal esophagus, stomach and duodenal sweep are unremarkable. No bowel wall thickening or dilatation. No evidence of obstruction. Cecum is partially displaced into the right upper quadrant. Extensive pancolonic diverticulosis is present without focal pericolonic inflammatory features to suggest diverticulitis. Vascular/Lymphatic: Atherosclerotic plaque is present throughout the aorta. There is mild fusiform dilatation of the infrarenal abdominal aorta up to 3.1 cm in maximal diameter. Mild ectasia of the common iliacs is noted as well measuring up to 2.3 cm on the left and 2.1 cm on the right. No suspicious or enlarged lymph nodes in the included lymphatic chains. Reproductive: The prostate and  seminal vesicles are unremarkable. Other: No abdominopelvic free fluid or free gas. No bowel containing hernias. Small fat containing inguinal hernias are noted. Small fat containing umbilical hernia is present. Musculoskeletal: Multilevel degenerative changes are present in the imaged portions of the   spine. There is focal levocurvature at L1-2 with partial fusion across the L1-2 levels. Discogenic and facet degenerative changes result in at least mild canal stenosis at L2-3. Moderate to severe foraminal narrowing is seen bilaterally at L4-5. No acute osseous abnormality or suspicious osseous lesion. IMPRESSION: 1. No acute intra-abdominal process. Specifically no evidence of urolithiasis or hydronephrosis. 2. Mild bilateral renal atrophy. 3. Extensive pancolonic diverticulosis without evidence of diverticulitis. 4. Mild fusiform dilatation of the infrarenal abdominal aorta up to 3.1 cm in maximal diameter. Mild ectasia of the common iliacs bilaterally. Recommend followup by ultrasound in 3 years. This recommendation follows ACR consensus guidelines: White Paper of the ACR Incidental Findings Committee II on Vascular Findings. J Am Coll Radiol 2013; 10:789-794. Aortic aneurysm NOS (ICD10-I71.9) 5. Aortic Atherosclerosis (ICD10-I70.0). Electronically Signed   By: Price  DeHay M.D.   On: 12/09/2018 02:37    Labs:  Basic Metabolic Panel: Recent Labs  Lab 12/23/18 0633 12/25/18 0617 12/26/18 0649  NA 134* 133* 132*  K 4.8 5.3* 5.1  CL 103 102 100  CO2 21* 23 21*  GLUCOSE 165* 144* 124*  BUN 74* 74* 76*  CREATININE 1.96* 1.66* 1.76*  CALCIUM 8.6* 8.8* 8.4*    CBC: Recent Labs  Lab 12/23/18 0633 12/25/18 0617 12/26/18 0649  WBC 2.4* 1.1* 0.8*  NEUTROABS 1.6* 0.7* 0.5*  HGB 9.9* 10.5* 10.5*  HCT 28.6* 30.4* 29.0*  MCV 94.7 94.1 93.2  PLT 149* 181 153    CBG: Recent Labs  Lab 12/25/18 2111 12/26/18 0623 12/26/18 1225 12/26/18 1654 12/26/18 2114  GLUCAP 319* 125* 120* 317* 254*    Family history.  Mother with lung cancer.  Mother with hypertension.  Father with hypertension as well as CAD.  Sister with rheumatoid arthritis.  Brief HPI:   David Jay Mcdaris Jr. is a 78 y.o. right-handed male with history of nontraumatic intracerebral hemorrhage secondary to hypertensive crisis affecting the right side June 2017 receiving inpatient rehab services 08/26/2015 to 09/10/2015 was discharged to skilled nursing facility.  Patient also history of prostate cancer, melanoma with brain mets diagnosed 2017 status post left frontal craniotomy tumor excision and radiation therapy to the brain and chemotherapy complicated by leukopenia, thrombocytopenia followed by Dr. Feng as well as Dr. Vaslow, hypothyroidism, hypertension, CAD with CABG, biventricular ICD.  Patient recently hospitalized 12/08/2018 for foot cellulitis pancytopenia and did receive Granix on 12/10/2018.  Placed on IV antibiotics as well as p.o. steroids discharged to home on p.o. doxycycline for 3 days.  Per chart review lives with wife [facility].  Wife reportedly with some Alzheimer's disease and cannot provide physical assistance.  Daughter/Dr. [name] Leggett is a local physician in the area.  Family to provide 24-hour care on discharge.  Presented 12/16/2018 with polyuria, polydipsia, generalized weakness and body aches x3 days.  Noted blood sugars in the 400s.  COVID negative, urinalysis negative nitrite urine culture no growth, CK 19, hemoglobin A1c 7.4, WBC 26,600, creatinine 2.23 with baseline 2.04 in late August however reported 09/23/2018 creatinine 1.56.  Chest x-ray negative, recent renal ultrasound showed some increased cortical echogenicity and mild cortical thinning bilaterally bilateral complex and simple appearing renal cyst.  Normal appearance of the urinary bladder.  Patient was placed on gentle IV fluids.  Low-dose prednisone for suspected gout left foot uric acid level within normal limits  sedimentation rate mildly elevated 26.  Leukocytosis felt to be secondary to recent Granix injection.  Subcutaneous Lovenox added for DVT prophylaxis.  Patient was   admitted for a comprehensive rehab program.   Hospital Course: [facility]. was admitted to rehab 12/18/2018 for inpatient therapies to consist of PT, ST and OT at least three hours five days a week. Past admission physiatrist, therapy team and rehab RN have worked together to provide customized collaborative inpatient rehab.  Pertaining to patient generalized debility weakness related to recent cellulitis gout flareup as well as history of malignant melanoma metastasis to the brain.  Patient was participating with therapies.  Maintained on Lovenox for DVT prophylaxis.  Low-dose aspirin with noted history of CAD with CABG.  Mood stabilization with Celexa as well as the addition of low-dose Ritalin to help patient regain focus to task with good results.  Foot pain swelling related to recurrent gout prednisone taper as advised as well as trial of Neurontin.  Stage IV malignant melanoma metastatic to the brain lung as well as history of nontraumatic intracerebral hemorrhage with right hemiparesis patient with history of craniotomy 2017 with chemotherapy radiation.  Followed by Dr. Burr Medico as well as Dr. Mickeal Skinner of oncology and medical oncology.  Latest WBC of 0.8 discussed with hematology oncology no active infection noted plan was to schedule bone marrow biopsy in the clinic with hematology services 1 to 2 weeks.  Blood sugars overall controlled monitored closely while on prednisone therapy and home dose Glucotrol resumed.  Blood pressure monitored with Norvasc as well as Toprol.  Patient with follow-up primary MD.  Kidney function remained stable latest creatinine 1.76.  Hormone supplement for hypothyroidism.  Crestor ongoing for hyperlipidemia.  Patient did have some tooth ache dental follow-up but noted history of prior tramadol neuralgia on  opposite side left likely not related to dental appeared to do better with steroids.   Blood pressures were monitored on TID basis and monitored  Diabetes has been monitored with ac/hs CBG checks and SSI was use prn for tighter BS control.   He is continent of bowel and bladder.  He has made gains during rehab stay and is attending therapies  He will continue to receive follow up therapies   after discharge  Rehab course: During patient's stay in rehab weekly team conferences were held to monitor patient's progress, set goals and discuss barriers to discharge. At admission, patient required minimal assist ambulate 80 feet rolling walker, minimal assist sit to stand, moderate assist supine to sit and sit to supine.  Min mod assist ADLs.  Physical exam.  Blood pressure 129/81 pulse 76 temperature 97.6 respirations 18 oxygen saturation 97% room air Neurological alert no acute distress makes good eye contact with examiner follows commands provides his name and age as well as birth.  Limited medical historian.  Mood and affect appropriate. Heart.  Regular rate rhythm no rubs murmurs or extra sounds Lungs clear to auscultation breathing unlabored no rales or wheezes Abdomen positive bowel sounds soft nontender palpation nondistended Extremities no clubbing cyanosis or edema Neurological cranial nerves II through XII intact motor strength 5 out of 5 bilateral deltoid, bicep, tricep, grip, 4 out of 5 bilateral hip flexors, knee extensors ankle dorsi plantarflexion.  Sensation normal sensation to light touch and proprioception bilateral upper and lower extremities.  Cerebellar exam normal finger-to-nose to finger musculoskeletal full range of motion   He  has had improvement in activity tolerance, balance, postural control as well as ability to compensate for deficits. He has had improvement in functional use RUE/LUE  and RLE/LLE as well as improvement in awareness.  Working with energy conservation  techniques.  Ambulates 150 feet rolling walker supervision including 2 turns.  Up and down curb rolling walker supervision.  Ambulates uneven surfaces rolling walker supervision.  Car transfers rolling walker supervision.  Can go up and down 4 stairs with 2 rails and supervision.  Working with dynamic balance and functional activity tolerance.  Ambulates throughout sessions with supervision rolling walker for ADLs.  Grooming task completed standing at sink with supervision.  He can ambulate clear to the therapy gym.  Follow-up speech therapy sessions facilitated by providing max assist to moderate assist cues to learn and problem basic game of BLINK.  Patient with specific difficulty understanding how to match according to rules of same number per card.  Again it was discussed the need for supervision for his safety.       Disposition: Discharge disposition: 01-Home or Self Care     Discharge to home   Diet: Diabetic diet  Special Instructions: No driving smoking or alcohol  Hematology to schedule bone marrow biopsy in the clinic 1 to 2 weeks.  Medications at discharge. 1.  Tylenol as needed 2.  Norvasc 5 mg p.o. daily 3.  Aspirin 81 mg p.o. daily 4.  Celexa 10 mg p.o. daily 5.  Neurontin 100 mg p.o. twice daily 6.  Hydrocodone 1 tablet every 6 hours as needed moderate pain 7.  Synthroid 137 mcg p.o. daily 8.  Ritalin 5 mg p.o. twice daily 9.  Toprol-XL 75 mg p.o. twice daily 10 MiraLAX twice daily hold for loose stools 11.  Prednisone taper 40 mg daily x1 day then 20 mg daily x3 days then 10 mg daily x3 days 12.  Crestor 20 mg p.o. nightly 13.  Glucotrol 5 mg 1/2 tablet twice daily  Discharge Instructions    Ambulatory referral to Physical Medicine Rehab   Complete by: As directed    Moderate complexity follow up 1-2 weeks debility/Multi medical      Follow-up Information    Lovorn, Megan, MD Follow up.   Specialty: Physical Medicine and Rehabilitation Why: Only as  needed Contact information: [address] [place] [address] [phone]        Feng, Yan, MD Follow up.   Specialties: Hematology, Oncology Why: Call for appointment Contact information: [address] [place] [address] [phone]        Vaslow, Zachary K, MD Follow up.   Specialties: Psychiatry, Neurology, Oncology Why: Call for appointment Contact information: [address] [place] [address] [phone]        Gutierrez, Javier, MD. Call.   Specialty: Family Medicine Why: when you get home and schedule an appointment to be seen within 2 weeks of discharge from Rehab. Contact information: [address] [place] [address] [phone]           Signed:  J  12/27/2018, 5:22 AM  

## 2018-12-26 NOTE — Progress Notes (Signed)
Occupational Therapy Session Note  Patient Details  Name: David [name]. MRN: [id number] Date of Birth: June 19, 1940  Today's Date: 12/26/2018 OT Individual Time: 1400-1500 OT Individual Time Calculation (min): 60 min    Short Term Goals: Week 1:  OT Short Term Goal 1 (Week 1): Pt will be able to sit to stand with min A to prepare for clothing management with LB self care and toileting. OT Short Term Goal 2 (Week 1): Pt will be able to stand with min A to adjust clothing over hips. OT Short Term Goal 3 (Week 1): Pt will be able to don shirt with set up. OT Short Term Goal 4 (Week 1): Pt will be able to don pants with mod A. OT Short Term Goal 5 (Week 1): Pt will be able to transfer to the toilet with min A.  Skilled Therapeutic Interventions/Progress Updates:    Pt seen for OT session focusing on functional mobility, dynamic balance and functional activity tolerance. Pt in supine upon arrival, agreeable to tx session. Unrated mild pain in B feet, RN made aware and pain medication administered at start of session.  He ambulated throughout session with supervision using RW, occasional VCs for RW management in functional context.  Grooming tasks completed standing at sink with supervision. He ambulated to therapy gym. Completed dynamic standing balance activity of putting golf balls on green, a favorite past time of his. Completed with steadying assist x4 Demetrius Mahler with HHA to return to sitting EOM to rest btwn trials.  Completed 2 trials of x4 6" steps using B rails with guarding assist for VCs for technique.  Dynamic standing task while standing on foam wedge to strengthen stabilizer muscles and provide calf stretch, pt able to maintain dynamic balance without UE support with guarding assist.  Pt returned to room at end of session, transitioned to supine and left with all needs in reach and bed alarm on.    Therapy Documentation Precautions:  Precautions Precautions:  Fall Restrictions Weight Bearing Restrictions: No Other Position/Activity Restrictions: B foot pain from gout/cellulitis   Therapy/Group: Individual Therapy  Eusebio Blazejewski L 12/26/2018, 7:03 AM

## 2018-12-26 NOTE — Progress Notes (Signed)
[facility] PHYSICAL MEDICINE & REHABILITATION PROGRESS NOTE   Subjective/Complaints: Patient seen laying in bed this morning.  States he slept well overnight.  He notes some "Pain" yesterday.  Discussed with nursing pain limiting therapies.  Discussed and reviewed labs with nursing.  ROS: +Feet pain. Denies CP, SOB, N/V/D    Objective:   No results found. Recent Labs    12/25/18 0617 12/26/18 0649  WBC 1.1* 0.8*  HGB 10.5* 10.5*  HCT 30.4* 29.0*  PLT 181 153   Recent Labs    12/25/18 0617 12/26/18 0649  NA 133* 132*  K 5.3* 5.1  CL 102 100  CO2 23 21*  GLUCOSE 144* 124*  BUN 74* 76*  CREATININE 1.66* 1.76*  CALCIUM 8.8* 8.4*    Intake/Output Summary (Last 24 hours) at 12/26/2018 0911 Last data filed at 12/26/2018 0700 Gross per 24 hour  Intake 480 ml  Output 2575 ml  Net -2095 ml     Physical Exam: Vital Signs Blood pressure 106/76, pulse 75, temperature 98.1 F (36.7 C), temperature source Oral, resp. rate 17, height 5' 9"  (1.753 m), weight 92.2 kg, SpO2 98 %. Constitutional: No distress . Vital signs reviewed. HENT: Normocephalic.  Atraumatic. Eyes: EOMI. No discharge. Cardiovascular: No JVD. Respiratory: Normal effort.  No stridor. GI: Non-distended. Skin: Ulcer under right first met head. Psych: Slow processing Musc: Minimal tenderness to palpation under right first met head. Neurologic: Alert Motor: 5/5 throughout  Assessment/Plan: 1. Functional deficits secondary to Debility with recent cellulitis and gout flare x2- previous and current which require 3+ hours per day of interdisciplinary therapy in a comprehensive inpatient rehab setting.  Physiatrist is providing close team supervision and 24 hour management of active medical problems listed below.  Physiatrist and rehab team continue to assess barriers to discharge/monitor patient progress toward functional and medical goals  Care Tool:  Bathing    Body parts bathed by patient: Right arm,  Left arm, Chest, Abdomen, Front perineal area, Right upper leg, Left upper leg, Face, Buttocks, Right lower leg, Left lower leg   Body parts bathed by helper: Right lower leg, Left lower leg     Bathing assist Assist Level: Contact Guard/Touching assist     Upper Body Dressing/Undressing Upper body dressing   What is the patient wearing?: Pull over shirt    Upper body assist Assist Level: Set up assist    Lower Body Dressing/Undressing Lower body dressing      What is the patient wearing?: Underwear/pull up, Pants     Lower body assist Assist for lower body dressing: Contact Guard/Touching assist     Toileting Toileting    Toileting assist Assist for toileting: Supervision/Verbal cueing     Transfers Chair/bed transfer  Transfers assist     Chair/bed transfer assist level: Minimal Assistance - Patient > 75%(no AD)     Locomotion Ambulation   Ambulation assist      Assist level: Contact Guard/Touching assist Assistive device: Walker-rolling Max distance: 150   Walk 10 feet activity   Assist     Assist level: Contact Guard/Touching assist Assistive device: Walker-rolling   Walk 50 feet activity   Assist Walk 50 feet with 2 turns activity did not occur: Safety/medical concerns  Assist level: Contact Guard/Touching assist Assistive device: Walker-rolling    Walk 150 feet activity   Assist Walk 150 feet activity did not occur: Safety/medical concerns  Assist level: Contact Guard/Touching assist Assistive device: Walker-rolling    Walk 10 feet on uneven surface  activity   Assist Walk 10 feet on uneven surfaces activity did not occur: Safety/medical concerns         Wheelchair     Assist Will patient use wheelchair at discharge?: Yes Type of Wheelchair: Manual    Wheelchair assist level: Minimal Assistance - Patient > 75% Max wheelchair distance: 150'    Wheelchair 50 feet with 2 turns activity    Assist     Wheelchair 50 feet with 2 turns activity did not occur: Safety/medical concerns   Assist Level: Minimal Assistance - Patient > 75%   Wheelchair 150 feet activity     Assist  Wheelchair 150 feet activity did not occur: Safety/medical concerns   Assist Level: Minimal Assistance - Patient > 75%   Blood pressure 106/76, pulse 75, temperature 98.1 F (36.7 C), temperature source Oral, resp. rate 17, height 5' 9"  (1.753 m), weight 92.2 kg, SpO2 98 %.  Medical Problem List and Plan: 1.Debilitysecondary to generalized weakness related to recent cellulitis with gout flareup as well as history of malignant melanoma with metastasis to the brain  Continue CIR 2. Antithrombotics: -DVT/anticoagulation:Lovenox -antiplatelet therapy: Aspirin 81 mg daily 3. Pain Management:Tylenol as needed 4. Mood:Celexa 10 mg daily. Provide emotional support  Added Ritalin  -antipsychotic agents: N/A 5. Neuropsych: This patientis not fullycapable of making decisions on hisown behalf. 6. Skin/Wound Care:Routine skin checks 7. Fluids/Electrolytes/Nutrition:Routine in and outs 8. Foot pain and swelling ?related to recurrent  gout.   On prednisone taper   Trial Gabapentin 100 BID 9. 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 Status post craniotomy 2017 with chemotherapy as well as radiation. Followed by Dr. Burr Medico as well as Dr. Mickeal Skinner 10. Leukopenia  WBCs 0.8 on 9/17  Will discuss with Heme/Onc 11. Diabetes mellitus with hyperglycemia. Hemoglobin A1c 7.4.  CBG (last 3)  Recent Labs    12/25/18 1649 12/25/18 2111 12/26/18 0623  GLUCAP 262* 319* 125*   Extremely labile, will d/c glipizide on 9/17 to avoid hypoglycemia, particularly given steroid wean 12. Hypertension. Norvasc 5 mg daily, Toprol 75 mg twice daily. Monitor with increased mobility Vitals:    12/25/18 1944 12/26/18 0501  BP: 107/74 106/76  Pulse: 75 75  Resp: 16 17  Temp: 98.8 F (37.1 C) 98.1 F (36.7 C)  SpO2: 99% 98%   Controlled on 9/17 13. AKI superimposed on CKD stage III.   Cr. 1.76 on 9/17  Cont tor monitor 14. CAD with CABG. Continue aspirin therapy no chest pain 15. Hypothyroidism. Synthroid 16. Hyperlipidemia. Crestor 17. Toothache  Dental pain- intermittent ,dental consult ordered per family request, prior hx of trigeminal neuralgia on opposite side (left) per report  9/16- likely not dental- could be trigeminal neuralgia on opposite side- doing better as well with steroids.  Improving 18.  Elevated temp: Resolved 19. Hx of Gout-   9/14- increased Prednisone to 50 mg day x 3 days then 40 mg day x 3 days then 20 mg x 3 days then 10 mg x3days  LOS: 8 days A FACE TO FACE EVALUATION WAS PERFORMED  David [name] [place]ix 12/26/2018, 9:11 AM

## 2018-12-26 NOTE — Significant Event (Signed)
CRITICAL VALUE ALERT  Critical Value:  WBC 0.8  Date & Time Notied:  Not notified by lab  Provider Notified: Dr. Posey Pronto   Orders Received/Actions taken: Dr. Posey Pronto to review chart  Brita Romp, RN

## 2018-12-26 NOTE — Progress Notes (Signed)
Social Work Patient ID: [name], male   DOB: 08/08/1940, 78 y.o.   MRN: [id number]   CSW met with pt yesterday to update him on team conference discussion and talked with pt's dtr today via telephone to update her and talk to her about pt's targeted d/c date of 12-28-18.  We discussed DME needed.  Pt has a rolling walker and a shower seat in walk in shower.  Pt's dtr, Dr. [name], requested that [place] arrange [facility] through [place].  CSW will work on this.  Pt's d/c was in question earlier today due to his WBC and other labs, but Dr. Posey Pronto clarified with Dr. Burr Medico that pt would be seen as in outpt in 1-2 weeks and bone marrow biopsy would be discussed.  Dr. [name] will set this appt up, as well as PCP appt so that she can attend appts.  Diabetic educator spoke with dtr and will meet with pt, as well.  Dietician will see pt, as well.  Pt is to receive CT scan and dtr wants to make sure this happens within a month after d/c.  CSW will f/u with PA, Marlowe Shores, about this.  Also, CSW will talk with respiratory therapist to make sure that pt's CPAP is ready to go when he is at home.  CSW will continue to follow and make sure that pt has what he needs at home.

## 2018-12-26 NOTE — Progress Notes (Signed)
Speech Language Pathology Daily Session Note  Patient Details  Name: David [name]. MRN: [id number] Date of Birth: Jun 18, 1940  Today's Date: 12/26/2018 SLP Individual Time: 1102-1200 SLP Individual Time Calculation (min): 58 min  Short Term Goals: Week 1: SLP Short Term Goal 1 (Week 1): Pt will demonstrate self-awareness and self-correction of functional errors in problem solving tasks with supervision A verbal cues. SLP Short Term Goal 1 - Progress (Week 1): Updated due to goal met SLP Short Term Goal 2 (Week 1): Pt will demonstrate selective attention for ~ 30 minutes with Min A cues. SLP Short Term Goal 3 (Week 1): Pt will complete semi-complex problem solving tasks (eg. medicaton management) with supervision A cues. SLP Short Term Goal 4 (Week 1): Pt will demonstrate recall of new, daily information with supervision A verbal cues for use of external aid.  Skilled Therapeutic Interventions:  Skilled treatment focused on cognition goals. SLP facilitated session by providing Max A to Mod A cues to learn and problem basic game of Carnuel. Pt with specific difficulty understanding how to match according to rules of same number per card. Education and support provided to pt on strategies and rationale as to why activity was difficult for location of previous neurological etiology. Pt left in bed, bed alarm on and all needs within reach. Continue per current plan of care.      Pain Pain Assessment Pain Score: 0-No pain  Therapy/Group: Individual Therapy  David [name] 12/26/2018, 12:23 PM

## 2018-12-26 NOTE — Progress Notes (Signed)
Put sterile water in patient's home CPAP.  Placed CPAP near patient's bed along with his cell phone if needed.  Assessed patient and helped patient put his mask on and turned on machine for patient.  Patient was resting on his CPAP machine with no distress noted when this RT left the room.  Will continue to monitor.

## 2018-12-26 NOTE — Progress Notes (Signed)
Living well with DM book provided to patient given his new diagnosis of DM.  Patient says he feels a bit overwhelmed with all the new information and says his daughter will help him take it all in gradually.  Also provided hand outs on how to check his blood sugar at home, provided him with a log to record his CBG readings, diabetic neuropathy, and basic pathophysiology of the disease.  I also demonstrated the steps of checking his own blood sugar.  Patient was receptive to teaching and would benefit from practice during his admission here.  Will update oncoming nurse to allow patient to perform his own blood sugar check tonight before bed with supervision/support from staff.  Spoke with DM coordinator today.  She informed me that the dietician will be coming to see the patient tomorrow to go over diet plan.  Brita Romp, RN

## 2018-12-26 NOTE — Progress Notes (Signed)
Physical Therapy Session Note  Patient Details  Name: David [name]. MRN: [id number] Date of Birth: June 21, 1940  Today's Date: 12/26/2018 PT Individual Time: 1001-1055 PT Individual Time Calculation (min): 54 min   Short Term Goals: Week 1:  PT Short Term Goal 1 (Week 1): Pt will perform sit<>stand at min A level with mod cues for technique. PT Short Term Goal 2 (Week 1): Pt will propel w/c with BUEs and/or LEs x 50' at S level. PT Short Term Goal 3 (Week 1): Pt will ambulate x 50' with RW at min A level Week 2:    Week 3:     Skilled Therapeutic Interventions/Progress Updates:    Pt initially supine in bed.  Bed placed flat and rails lowered.  Pt able to roll, scoot, and perform sit to supine and supine to sit independently.   Sit to stand from bed to RW w/supervision Bed to/from wc via stand/turn/sit w/RW supervision  Gait 128ft w/RW and supervision including 2 turns. Ascend/descends curb w/RW w/supervision Gait 31ft on uneven surface w/RW w/supervision Car transfer w/RW w/supervision  Ascend/Descend 4stairs w/two rails w/supervision  Gait 94ft w/RW w/supervision, turn/sit to Nustep w/supervision w/RW  NuStep x 10 min L3.0 RPE 5/10 for cardiovascular conditioning and general strengthening.    Much improved activity tolerance w/significantly decreased pain and improved gait pattern.  Dynamic standing balance deficits make mobiity without an AD unsafe at this time.  Cognition is primary limiting factor to independence.      Therapy Documentation Precautions:  Precautions Precautions: Fall Restrictions Weight Bearing Restrictions: No Other Position/Activity Restrictions: B foot pain from gout/cellulitis  Pain pt reported 4/10 feet w/stairs, limited to single set due to complaints.  Overall much improved today per pt.    Therapy/Group: Individual Therapy  Callie Fielding, PT  12/26/2018, 12:56 PM

## 2018-12-26 NOTE — Progress Notes (Signed)
Spoke with Dr. [name], patient's daughter on the phone about her dad's diabetes. Patient is newly diagnosed with diabetes, hgbA1C is 7.4%. Has had several rounds of steroids since July due to flair up of gout and sepsis. Blood sugars were running greater than 400 mg/dl recently and patient complaining of polyuria. Suggestion made by daughter for changing diet to CHO Modified Medium diet with 60 grams of CHO with each meal while in the hospital.  Perhaps having a lower CHO meal at dinner would help to keep his blood sugars down, since his CBGs have been elevated at lunch and dinner in the hospital. Patient follows a fairly healthy diet; meats, vegetables, some CHO, no alcohol.   Patient lives at [facility] independent living facility. Has home health in the mornings; needs help with wife who has Alzheimer's. Will be having 24/7 help for a couple of weeks when discharged. Will get help with preparing meals at home. Patient does have gout, so needs a list of foods that he must avoid. Will order Living Well with Diabetes booklet for patient.   Diabetes coordinator will follow up with patient tomorrow. Spoke with dietician who will visit with patient tomorrow, as well. Will continue to monitor blood sugars while in rehab until his discharge scheduled for Saturday.   Harvel Ricks RN BSN CDE Diabetes Coordinator Pager: [phone]  8am-5pm

## 2018-12-27 ENCOUNTER — Inpatient Hospital Stay (HOSPITAL_COMMUNITY): Payer: Medicare Other

## 2018-12-27 ENCOUNTER — Inpatient Hospital Stay (HOSPITAL_COMMUNITY): Payer: Medicare Other | Admitting: Speech Pathology

## 2018-12-27 ENCOUNTER — Inpatient Hospital Stay (HOSPITAL_COMMUNITY): Payer: Medicare Other | Admitting: Occupational Therapy

## 2018-12-27 ENCOUNTER — Telehealth: Payer: Self-pay

## 2018-12-27 DIAGNOSIS — D708 Other neutropenia: Secondary | ICD-10-CM

## 2018-12-27 DIAGNOSIS — I10 Essential (primary) hypertension: Secondary | ICD-10-CM

## 2018-12-27 DIAGNOSIS — T380X5A Adverse effect of glucocorticoids and synthetic analogues, initial encounter: Secondary | ICD-10-CM

## 2018-12-27 DIAGNOSIS — R739 Hyperglycemia, unspecified: Secondary | ICD-10-CM

## 2018-12-27 DIAGNOSIS — R7309 Other abnormal glucose: Secondary | ICD-10-CM

## 2018-12-27 LAB — PATHOLOGIST SMEAR REVIEW

## 2018-12-27 LAB — GLUCOSE, CAPILLARY
Glucose-Capillary: 114 mg/dL — ABNORMAL HIGH (ref 70–99)
Glucose-Capillary: 146 mg/dL — ABNORMAL HIGH (ref 70–99)
Glucose-Capillary: 307 mg/dL — ABNORMAL HIGH (ref 70–99)
Glucose-Capillary: 281 mg/dL — ABNORMAL HIGH (ref 70–99)

## 2018-12-27 MED ORDER — METHYLPHENIDATE HCL 5 MG PO TABS: 5.0000 mg | ORAL_TABLET | Freq: Two times a day (BID) | ORAL | 0 refills | Status: DC

## 2018-12-27 MED ORDER — SENNA 8.6 MG PO TABS: 1.0000 | ORAL_TABLET | Freq: Every day | ORAL | 0 refills | Status: DC

## 2018-12-27 MED ORDER — CITALOPRAM HYDROBROMIDE 10 MG PO TABS: 10.0000 mg | ORAL_TABLET | Freq: Every day | ORAL | 1 refills | Status: DC

## 2018-12-27 MED ORDER — LEVOTHYROXINE SODIUM 137 MCG PO TABS: ORAL_TABLET | ORAL | 11 refills | Status: AC

## 2018-12-27 MED ORDER — GLIPIZIDE 5 MG PO TABS: 2.5000 mg | ORAL_TABLET | Freq: Two times a day (BID) | ORAL | 0 refills | Status: DC

## 2018-12-27 MED ORDER — INFLUENZA VAC A&B SA ADJ QUAD 0.5 ML IM PRSY: 0.5000 mL | PREFILLED_SYRINGE | INTRAMUSCULAR | Status: DC

## 2018-12-27 MED ORDER — METOPROLOL SUCCINATE ER 50 MG PO TB24: ORAL_TABLET | ORAL | 11 refills | Status: AC

## 2018-12-27 MED ORDER — ACETAMINOPHEN 325 MG PO TABS: 650.0000 mg | ORAL_TABLET | Freq: Four times a day (QID) | ORAL | Status: AC | PRN

## 2018-12-27 MED ORDER — PREDNISONE 10 MG PO TABS: ORAL_TABLET | ORAL | 0 refills | Status: DC

## 2018-12-27 MED ORDER — VITAMIN D3 25 MCG (1000 UT) PO CAPS: 1.0000 | ORAL_CAPSULE | Freq: Every day | ORAL | 0 refills | Status: AC

## 2018-12-27 MED ORDER — AMLODIPINE BESYLATE 5 MG PO TABS: 5.0000 mg | ORAL_TABLET | Freq: Every day | ORAL | 11 refills | Status: DC

## 2018-12-27 MED ORDER — POLYETHYLENE GLYCOL 3350 17 G PO PACK: 17.0000 g | PACK | Freq: Two times a day (BID) | ORAL | 0 refills | Status: DC

## 2018-12-27 MED ORDER — ROSUVASTATIN CALCIUM 20 MG PO TABS: 20.0000 mg | ORAL_TABLET | Freq: Every day | ORAL | 8 refills | Status: DC

## 2018-12-27 MED ORDER — GABAPENTIN 100 MG PO CAPS: 100.0000 mg | ORAL_CAPSULE | Freq: Two times a day (BID) | ORAL | 0 refills | Status: DC

## 2018-12-27 NOTE — Progress Notes (Signed)
Occupational Therapy Session Note  Patient Details  Name: David [name]. MRN: [id number] Date of Birth: 03/12/1941  Today's Date: 12/27/2018 OT Individual Time: 5916-3846 OT Individual Time Calculation (min): 40 min    Short Term Goals: Week 1:  OT Short Term Goal 1 (Week 1): Pt will be able to sit to stand with min A to prepare for clothing management with LB self care and toileting. OT Short Term Goal 2 (Week 1): Pt will be able to stand with min A to adjust clothing over hips. OT Short Term Goal 3 (Week 1): Pt will be able to don shirt with set up. OT Short Term Goal 4 (Week 1): Pt will be able to don pants with mod A. OT Short Term Goal 5 (Week 1): Pt will be able to transfer to the toilet with min A.  Skilled Therapeutic Interventions/Progress Updates:    Pt seen for OT ADL bathing/dressing session. Pt awake in supine upon arrival with RN present adminsitering AM meds, agreeable to tx session and denying pain.  ADLs: Bathed at shower level seated on shower chair with set-up and distant supervision. Returned to w/c to dress, supervision overall with VCs to utilize RW when standing to complete clothing management. Total A for TED hose and pt donned slide on shoes. Grooming tasks standing at sink with distant supervision  Transfers/Mobility: Sit>stand with RW and supervision. Ambulated within room with supervision.  Simualted shower stall transfer in prep for d/c. Following demonstration for technique, pt return demonstrated ability to step back into shower with supervision using RW. Pt's daughter arrived and education provided to her regarding shower recommendations and shower chair vs TTB for transfer method.   Pt returned to room at end of session, left seated in w/c with all needs in reach, chair belt alarm on and daughter present.   Therapy Documentation Precautions:  Precautions Precautions: Fall Restrictions Weight Bearing Restrictions: No Other Position/Activity  Restrictions: B foot pain from gout/cellulitis   Therapy/Group: Individual Therapy  [name] 12/27/2018, 6:49 AM

## 2018-12-27 NOTE — Progress Notes (Signed)
Occupational Therapy Discharge Summary  Patient Details  Name: David [name]. MRN: [id number] Date of Birth: [birth year]  Patient has met 12 of 12 long term goals due to improved activity tolerance, improved balance, postural control, improved awareness and improved coordination.  Patient to discharge at overall Supervision level.  Patient's wife is unable to provide physical/cognitive assist, however, family plans to hire 24/7 supervision assist at d/c.  Pt is ambulating with RW and supervision, occasional VCs for safety awareness/ management of RW. Bathing and dressing seated with supervision.   Recommendation:  Patient will benefit from ongoing skilled OT services in home health setting to continue to advance functional skills in the area of BADL and Reduce care partner burden.  Equipment: No equipment provided  Reasons for discharge: treatment goals met and discharge from hospital  Patient/family agrees with progress made and goals achieved: Yes  OT Discharge Precautions/Restrictions  Precautions Precautions: Fall Restrictions Weight Bearing Restrictions: No Vision Baseline Vision/History: Wears glasses Wears Glasses: Reading only Patient Visual Report: No change from baseline Vision Assessment?: No apparent visual deficits Perception  Perception: Within Functional Limits Praxis Praxis: Impaired Praxis Impairment Details: Motor planning Cognition Overall Cognitive Status: History of cognitive impairments - at baseline Arousal/Alertness: Awake/alert Orientation Level: Oriented to person;Oriented to place;Oriented to situation;Disoriented to time Attention: Selective Selective Attention: Impaired Selective Attention Impairment: Verbal complex;Functional basic Memory: Impaired Memory Impairment: Decreased recall of new information;Decreased short term memory Decreased Short Term Memory: Verbal basic;Functional basic Awareness: Impaired Awareness Impairment:  Intellectual impairment Problem Solving: Impaired Problem Solving Impairment: Verbal complex;Functional basic Safety/Judgment: Impaired Comments: Decreased awareness of deficits/functional implications Sensation Sensation Light Touch: Appears Intact Hot/Cold: Appears Intact Proprioception: Appears Intact Stereognosis: Appears Intact Coordination Gross Motor Movements are Fluid and Coordinated: No Fine Motor Movements are Fluid and Coordinated: Yes Coordination and Movement Description: Generalized weakness/deconditioning Motor  Motor Motor: Other (comment) Motor - Skilled Clinical Observations: generalized weakness Mobility  Bed Mobility Bed Mobility: Rolling Right;Rolling Left Rolling Right: Supervision/verbal cueing Rolling Left: Supervision/Verbal cueing Left Sidelying to Sit: Supervision/Verbal cueing Sit to Supine: Supervision/Verbal cueing Transfers Sit to Stand: Supervision/Verbal cueing Stand to Sit: Supervision/Verbal cueing  Trunk/Postural Assessment  Cervical Assessment Cervical Assessment: Exceptions to [facility] Cervical AROM Overall Cervical AROM Comments: Forward head Thoracic Assessment Thoracic Assessment: Exceptions to [facility] Thoracic AROM Overall Thoracic AROM Comments: Rounded shoulders, kyphotic posture Lumbar Assessment Lumbar Assessment: Within Functional Limits Lumbar AROM Overall Lumbar AROM Comments: increased posterior pelvic tilt Postural Control Postural Control: Deficits on evaluation Righting Reactions: decreased righting reaction in standing without support  Balance Balance Balance Assessed: Yes Static Sitting Balance Static Sitting - Balance Support: Feet supported;No upper extremity supported Static Sitting - Level of Assistance: 7: Independent Static Sitting - Comment/# of Minutes: Sitting EOB Dynamic Sitting Balance Dynamic Sitting - Balance Support: During functional activity;Feet supported Dynamic Sitting - Level of Assistance: 5:  Stand by assistance Sitting balance - Comments: Sitting on shower chair to complete bathing task Static Standing Balance Static Standing - Balance Support: No upper extremity supported;During functional activity Static Standing - Level of Assistance: 6: Modified independent (Device/Increase time) Dynamic Standing Balance Dynamic Standing - Balance Support: During functional activity;Right upper extremity supported;Left upper extremity supported Dynamic Standing - Level of Assistance: 5: Stand by assistance Dynamic Standing - Comments: Standing at sink to complete grooming tasks. Extremity/Trunk Assessment RUE Assessment RUE Assessment: Exceptions to [facility] Passive Range of Motion (PROM) Comments: R shoulder limited to 45 degrees flexion Active Range of Motion (AROM) Comments: R shoulder flexion  20 degrees General Strength Comments: distal R AROM and strength WFL LUE Assessment LUE Assessment: Within Functional Limits   David [name] L 12/27/2018, 12:51 PM

## 2018-12-27 NOTE — Progress Notes (Signed)
Patient placed on home CPAP at this time RA, patient tolerating well, RCP will continue to follow.

## 2018-12-27 NOTE — Progress Notes (Addendum)
Physical Therapy Discharge Summary  Patient Details  Name: Dmetrius Ambs. MRN: [id number] Date of Birth: July 25, 1940  Today's Date: 12/27/2018 PT Individual Time: 1047-1202 PT Individual Time Calculation (min): 75 min    Patient has met 10 of 11 long term goals due to improved activity tolerance, improved balance, improved postural control, increased strength, increased range of motion, decreased pain and functional use of  right lower extremity and left lower extremity.  Patient to discharge at an ambulatory level Supervision.   Patient's care partner unavailable and is a hired Environmental consultant, did not receive additional training, daughter made arrangements for caregiver to provide the necessary physical and cognitive assistance at discharge.  Pt will benefit from HHPT to address functional mobility in home setting and further address dynamic balance now that pt has improved tolerance/decreased pain in standing.    Reasons goals not met: dyanamic balance will continue to require skilled intervention to decrease pt risk of falls, pt is functioning w/RW at supervision for gait.  Recommendation:  Patient will benefit from ongoing skilled PT services in home health setting to continue to advance safe functional mobility, address ongoing impairments in balance, and minimize fall risk.  Equipment: No equipment provided, pt has RW and rollator at home  Reasons for discharge: treatment goals met  Patient/family agrees with progress made and goals achieved: Yes  PT Discharge Precautions/Restrictions   Vital Signs   Pain Pain Assessment Pain Scale: 0-10 Pain Score: 8 (bottom R foot) Vision/Perception     Cognition Overall Cognitive Status: History of cognitive impairments - at baseline Sensation Sensation Hot/Cold: Appears Intact Proprioception: Appears Intact Stereognosis: Appears Intact Motor  Motor Motor - Skilled Clinical Observations: generalized weakness  Mobility Bed  Mobility Bed Mobility: Rolling Right;Rolling Left Rolling Right: Supervision/verbal cueing Rolling Left: Supervision/Verbal cueing Left Sidelying to Sit: Supervision/Verbal cueing Sit to Supine: Supervision/Verbal cueing Transfers Transfers: Sit to Stand;Stand to Sit;Stand Pivot Transfers Sit to Stand: Supervision/Verbal cueing Stand to Sit: Supervision/Verbal cueing Stand Pivot Transfers: Supervision/Verbal cueing Stand Pivot Transfer Details (indicate cue type and reason): supervision due to balance deficits Transfer (Assistive device): Rolling walker Locomotion  Gait Ambulation: Yes Gait Assistance: Supervision/Verbal cueing Assistive device: Rolling walker Gait Assistance Details: Does not clear feet rather, shuffles due to pain in R foot. Gait Gait: Yes Gait Pattern: Impaired Gait Pattern: Shuffle Gait velocity: decreased Stairs / Additional Locomotion Stairs: Yes Stairs Assistance: Supervision/Verbal cueing Stair Management Technique: Two rails Number of Stairs: 4 Height of Stairs: 6 Curb: Supervision/Verbal cueing  Trunk/Postural Assessment  Cervical Assessment Cervical Assessment: Exceptions to [facility] Cervical AROM Overall Cervical AROM Comments: Forward head Thoracic Assessment Thoracic Assessment: Exceptions to [facility] Thoracic AROM Overall Thoracic AROM Comments: Rounded shoulders, kyphotic posture Lumbar Assessment Lumbar Assessment: Within Functional Limits Postural Control Righting Reactions: decreased righting reaction in standing without support  Balance Static Sitting Balance Static Sitting - Level of Assistance: 5: Stand by assistance Dynamic Sitting Balance Dynamic Sitting - Level of Assistance: 5: Stand by assistance Static Standing Balance Static Standing - Level of Assistance: 6: Modified independent (Device/Increase time) Dynamic Standing Balance Dynamic Standing - Level of Assistance: 3: Mod assist Extremity Assessment   bilat LE's WNLs except at  hips which range 3+ to 4/5 bilat UE's WFL's ecept L shoulder AROM flex and abd to approx 90degrees.    Other treatment: Supine to sit w/supervsion, sit to stand and stand turn transfer to wc w/RW and supervsion Gait 126f x 2, 1027fx 1 W/RW and supervision  Gait 3066f  to Nustep, turn sit transfer w/rW and Supervision. NuStep L2 x 10 min RPE 5/10 for cardiovascular conditioning and gerneral strengthening.  Dynamic/static standing balance activities including:   Static stand without AD w/mild increased sway no LOB x 1 min Static stand w/cervical nods and rotation no lob, mild increased sway Static stand w/alternating and bilat overhead reach w/mild increased sway, cga Static stand w/feet together w/moderate increased sway, cga x 1 min Static tandem stance 30 sec x 2 r/l and l/r w/cga to min assist due to mild lob.   Stand/turn/sit on EOB w/RW and supervision.  Sit to supine w/supervision.  Pt left in bed w/bed alarm set, rails up x 3, needs in reach, nursing in room w/pt.   Callie Fielding, PT   12/27/2018, 12:41 PM

## 2018-12-27 NOTE — Progress Notes (Signed)
Speech Language Pathology Discharge Summary  Patient Details  Name: David [name]. MRN: [id number] Date of Birth: [birth year]  Today's Date: 12/27/2018 SLP Individual Time: 1400-1500 SLP Individual Time Calculation (min): 60 min   Skilled Therapeutic Interventions:  Skilled treatment session focused on cognition goals. SLP facilitated session by providing compensatory memory strategy handout and reviewing with pt. SLP further posed hypothetical safety situations and pr required Min A to supervision cues to answer within current physical/cognitive ability level. All education has been completed and pt's questions answered.     Patient has met 4 of 4 long term goals.  Patient to discharge at overall Supervision;Min level.  Reasons goals not met:   N/A  Clinical Impression/Discharge Summary:   Pt has made good progress over the course of skilled ST and as a result he is discharging at supervision level of care. Pt and his family have planned to have additional support within home to provide this level of support. Given pt's age and neurological history, prognosis for return to Mod I is guarded. HHST is recommended to target anticipatory awareness within home setting.    Care Partner:  Caregiver Able to Provide Assistance: No     Recommendation:  24 hour supervision/assistance      Equipment:   N/A  Reasons for discharge: Treatment goals met   Patient/Family Agrees with Progress Made and Goals Achieved: Yes    Jabree Rebert 12/27/2018, 2:52 PM

## 2018-12-27 NOTE — Discharge Instructions (Signed)
Inpatient Rehab Discharge Instructions  David [name]. Discharge date and time: No discharge date for patient encounter.   Activities/Precautions/ Functional Status: Activity: activity as tolerated Diet: regular diet Wound Care: none needed Functional status:  ___ No restrictions     ___ Walk up steps independently ___ 24/7 supervision/assistance   ___ Walk up steps with assistance ___ Intermittent supervision/assistance  ___ Bathe/dress independently ___ Walk with walker     _x__ Bathe/dress with assistance ___ Walk Independently    ___ Shower independently ___ Walk with assistance    ___ Shower with assistance ___ No alcohol     ___ Return to work/school ________  COMMUNITY REFERRALS UPON DISCHARGE:   Home Health:   PT     OT     RN     Agency:  [facility] Phone:  [phone] Medical Equipment/Items Ordered:  Please use rolling walker and shower seat you already have at home.  Sonia Baller called the [facility] and spoke with Dr. Ernestina Penna nurse about pt's scans that have been postponed due to medical issues requiring hospitalizations.  Nurse is sending notes to Shirlean Mylar, Prostate Navigator and Dr. Renda Rolls team to make sure that head CT and MRI of prostate/pelvis are scheduled.  Schedulers will call Dr. [name] to arrange.   Special Instructions: No driving smoking or alcohol   My questions have been answered and I understand these instructions. I will adhere to these goals and the provided educational materials after my discharge from the hospital.  Patient/Caregiver Signature _______________________________ Date __________  Clinician Signature _______________________________________ Date __________  Please bring this form and your medication list with you to all your follow-up doctor's appointments.

## 2018-12-27 NOTE — Plan of Care (Signed)
Nutrition Education Note  RD consulted for nutrition education regarding diabetes. RD spoke with Diabetes Coordinator on 9/17 to discuss pt's needs.  Spoke with pt at bedside. Pt had just finished eating lunch and had completed ~95% of lunch meal tray.  Pt reports that he is aware of his DM diagnosis but is slightly overwhelmed given all of the new information he has received. Pt reports he knows that juice, fruits, and "starches" all contain carbohydrate. RD discussed the relationship between carbohydrate consumption and increase in blood sugar.  Pt reports that he has to "cut out" bread. Discussed the importance of continuing to consume carbohydrate-containing foods and not eliminating them from his diet. Discussed importance of choosing whole grain starch options to maximize fiber intake.  Lab Results  Component Value Date   HGBA1C 7.4 (H) 12/16/2018    RD provided "Carbohydrate Counting for People with Diabetes" handout from the Academy of Nutrition and Dietetics.  Discussed different food groups and their effects on blood sugar, emphasizing carbohydrate-containing foods. Provided list of carbohydrates and recommended serving sizes of common foods.  Discussed other food groups (proteins, fats, vegetables) and the impact that these foods have on blood sugar. Pt with specific questions about eggs as these are one of his favorite foods.  Discussed importance of controlled and consistent carbohydrate intake throughout the day. Provided examples of ways to balance meals/snacks and encouraged intake of high-fiber, whole grain complex carbohydrates. Teach back method used.  Expect good compliance.  Body mass index is 30.01 kg/m. Pt meets criteria for obesity class I based on current BMI.  Current diet order is Carb Modified, patient is consuming approximately 100% of meals at this time. Labs and medications reviewed. No further nutrition interventions warranted at this time. RD contact  information provided. If additional nutrition issues arise, please re-consult RD.   Gaynell Face, MS, RD, LDN Inpatient Clinical Dietitian Pager: [phone] Weekend/After Hours: [phone]

## 2018-12-27 NOTE — Progress Notes (Signed)
[facility] PHYSICAL MEDICINE & REHABILITATION PROGRESS NOTE   Subjective/Complaints: Patient seen laying in bed this morning.  He states he slept well overnight.  He notes improvement in foot pain.  Discussed with oncology yesterday, will follow-up as outpatient.  Appreciate oncology recs, notes reviewed-biopsy as outpatient.  ROS: Denies CP, SOB, N/V/D    Objective:   No results found. Recent Labs    12/25/18 0617 12/26/18 0649  WBC 1.1* 0.8*  HGB 10.5* 10.5*  HCT 30.4* 29.0*  PLT 181 153   Recent Labs    12/25/18 0617 12/26/18 0649  NA 133* 132*  K 5.3* 5.1  CL 102 100  CO2 23 21*  GLUCOSE 144* 124*  BUN 74* 76*  CREATININE 1.66* 1.76*  CALCIUM 8.8* 8.4*    Intake/Output Summary (Last 24 hours) at 12/27/2018 0847 Last data filed at 12/27/2018 5809 Gross per 24 hour  Intake 1080 ml  Output 2650 ml  Net -1570 ml     Physical Exam: Vital Signs Blood pressure 110/79, pulse 75, temperature 98.3 F (36.8 C), resp. rate 18, height 5' 9" (1.753 m), weight 92.2 kg, SpO2 99 %. Constitutional: No distress . Vital signs reviewed. HENT: Normocephalic.  Atraumatic. Eyes: EOMI. No discharge. Cardiovascular: No JVD. Respiratory: Normal effort.  No stridor. GI: Non-distended. Skin: Ulcer under right first met head Psych: Slow processing Musc: Tenderness to palpation under right first met head improving. Neurologic: Alert Motor: 5/5 throughout, unchanged  Assessment/Plan: 1. Functional deficits secondary to Debility with recent cellulitis and gout flare x2- previous and current which require 3+ hours per day of interdisciplinary therapy in a comprehensive inpatient rehab setting.  Physiatrist is providing close team supervision and 24 hour management of active medical problems listed below.  Physiatrist and rehab team continue to assess barriers to discharge/monitor patient progress toward functional and medical goals  Care Tool:  Bathing    Body parts bathed by  patient: Right arm, Left arm, Chest, Abdomen, Front perineal area, Right upper leg, Left upper leg, Face, Buttocks, Right lower leg, Left lower leg   Body parts bathed by helper: Right lower leg, Left lower leg     Bathing assist Assist Level: Supervision/Verbal cueing     Upper Body Dressing/Undressing Upper body dressing   What is the patient wearing?: Pull over shirt    Upper body assist Assist Level: Set up assist    Lower Body Dressing/Undressing Lower body dressing      What is the patient wearing?: Underwear/pull up, Pants     Lower body assist Assist for lower body dressing: Supervision/Verbal cueing     Toileting Toileting    Toileting assist Assist for toileting: Supervision/Verbal cueing     Transfers Chair/bed transfer  Transfers assist     Chair/bed transfer assist level: Supervision/Verbal cueing     Locomotion Ambulation   Ambulation assist      Assist level: Supervision/Verbal cueing Assistive device: Walker-rolling Max distance: 150   Walk 10 feet activity   Assist     Assist level: Supervision/Verbal cueing Assistive device: Walker-rolling   Walk 50 feet activity   Assist Walk 50 feet with 2 turns activity did not occur: Safety/medical concerns  Assist level: Supervision/Verbal cueing Assistive device: Walker-rolling    Walk 150 feet activity   Assist Walk 150 feet activity did not occur: Safety/medical concerns  Assist level: Supervision/Verbal cueing Assistive device: Walker-rolling    Walk 10 feet on uneven surface  activity   Assist Walk 10 feet on uneven  surfaces activity did not occur: Safety/medical concerns   Assist level: Supervision/Verbal cueing Assistive device: Aeronautical engineer Will patient use wheelchair at discharge?: No Type of Wheelchair: Manual    Wheelchair assist level: Minimal Assistance - Patient > 75% Max wheelchair distance: 150'    Wheelchair 50 feet  with 2 turns activity    Assist    Wheelchair 50 feet with 2 turns activity did not occur: Safety/medical concerns   Assist Level: Minimal Assistance - Patient > 75%   Wheelchair 150 feet activity     Assist  Wheelchair 150 feet activity did not occur: Safety/medical concerns   Assist Level: Minimal Assistance - Patient > 75%   Blood pressure 110/79, pulse 75, temperature 98.3 F (36.8 C), resp. rate 18, height 5' 9" (1.753 m), weight 92.2 kg, SpO2 99 %.  Medical Problem List and Plan: 1.Debilitysecondary to generalized weakness related to recent cellulitis with gout flareup as well as history of malignant melanoma with metastasis to the brain  Continue CIR, plan for discharge tomorrow 2. Antithrombotics: -DVT/anticoagulation:Lovenox -antiplatelet therapy: Aspirin 81 mg daily 3. Pain Management:Tylenol as needed 4. Mood:Celexa 10 mg daily. Provide emotional support  Added Ritalin  -antipsychotic agents: N/A 5. Neuropsych: This patientis not fullycapable of making decisions on hisown behalf. 6. Skin/Wound Care:Routine skin checks 7. Fluids/Electrolytes/Nutrition:Routine in and outs 8. Foot pain ?related to recurrent  Gout +/- neuropathic.   On prednisone taper   Gabapentin 100 BID with improvement, continue 9. 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 Status post craniotomy 2017 with chemotherapy as well as radiation. Followed by Dr. Burr Medico as well as Dr. Mickeal Skinner 10. Leukopenia  WBCs 0.8 on 9/17  Discussed with oncology, plan to continue current treatment with follow-up biopsy as outpatient, appreciate recs. 11. Diabetes mellitus with hyperglycemia. Hemoglobin A1c 7.4.  CBG (last 3)  Recent Labs    12/26/18 1654 12/26/18 2114 12/27/18 0602  GLUCAP 317* 254* 114*   Remains very labile, we will not make any changes  given steroid taper, will need follow-up as outpatient.  Diet changed to carb modified 12. Hypertension. Norvasc 5 mg daily, Toprol 75 mg twice daily. Monitor with increased mobility Vitals:   12/26/18 2218 12/27/18 0456  BP:  110/79  Pulse: 75 75  Resp: 16 18  Temp:  98.3 F (36.8 C)  SpO2: 96% 99%   Controlled on 9/18 13. AKI superimposed on CKD stage III.   Cr. 1.76 on 9/17  Cont tor monitor 14. CAD with CABG. Continue aspirin therapy no chest pain 15. Hypothyroidism. Synthroid 16. Hyperlipidemia. Crestor 17. Toothache  Dental pain- intermittent ,dental consult ordered per family request, prior hx of trigeminal neuralgia on opposite side (left) per report  9/16- likely not dental- could be trigeminal neuralgia on opposite side- doing better as well with steroids.  Improving 18.  Elevated temp: Resolved 19. Hx of Gout-   9/14- increased Prednisone to 50 mg day x 3 days then 40 mg day x 3 days then 20 mg x 3 days then 10 mg x3days  LOS: 9 days A FACE TO FACE EVALUATION WAS PERFORMED  Ankit Lorie Phenix 12/27/2018, 8:47 AM

## 2018-12-27 NOTE — Progress Notes (Signed)
Pt. Had an episode of bloody stools,Dan [place] PAC was notified,he said pt. Is going to see an hematologist next week.

## 2018-12-27 NOTE — Progress Notes (Signed)
Patient was assisted to/ from [place] by Raquel Sarna D.,NT),noted bright blood mixed with stool content (stool was flushed before seen by RN), upon speaking with patient he informs me that this occurs at times and he is not alarmed by this occurrence. Informed patient and NT to notify nurse  when this occurs..Monitor and assisted as needed

## 2018-12-27 NOTE — Progress Notes (Signed)
Social Work Discharge Note   The overall goal for the admission was met for:   Discharge location: Yes - home with caregivers and family to be with him 24/7   Length of Stay: Yes - 10 days  Discharge activity level: Yes - overall supervision  Home/community participation: Yes  Services provided included: MD, RD, PT, OT, SLP, RN, Pharmacy, Neuropsych and SW  Financial Services: Medicare and Private Insurance: Medicare supplement  Follow-up services arranged: Home Health: PT/OT/RN, DME: rolling walker and shower chair were recommended and pt has both at home already and Patient/Family request agency HH: [facility], DME: pt has all recommended DME at home  Comments (or additional information): Pt's dtr has been present for therapies and is aware of pt's need for 24/7 supervision initially.  Dtr, son, and paid caregivers to be with pt and his wife 24/7 initially.  Dtr is contact for coordinating pt's outpt care to include CT scan, bone marrow biopsy, MRI prostate/pelvis, PCP f/u, etc.  [place] team to call her to schedule.  Patient/Family verbalized understanding of follow-up arrangements: Yes  Individual responsible for coordination of the follow-up plan: pt's dtr, Dr. Silas [place] [phone]  Confirmed correct DME delivered: Trey Sailors 12/27/2018    Jacquees Gongora, [name]

## 2018-12-27 NOTE — Telephone Encounter (Signed)
Received phone call from Numa from [facility] (inpatient rehab) informing our office that patient is scheduled to be discharge tomorrow. CSW also mentioned that per patient's daughter, he had 2 CT scans that were missed while he was in the hospital and need to be rescheduled. In basket message sent to Dr. Mickeal Skinner and his nurse, as well as [place] Navigator to inform them of patient's upcoming discharge and daughter's desire to get his scans rescheduled. Will continue to support as needed

## 2018-12-27 NOTE — Progress Notes (Signed)
Inpatient Diabetes Program Recommendations  AACE/ADA: New Consensus Statement on Inpatient Glycemic Control (2015)  Target Ranges:  Prepandial:   less than 140 mg/dL      Peak postprandial:   less than 180 mg/dL (1-2 hours)      Critically ill patients:  140 - 180 mg/dL   Lab Results  Component Value Date   GLUCAP 146 (H) 12/27/2018   HGBA1C 7.4 (H) 12/16/2018    Review of Glycemic Control Results for David [name], David [name] (MRN [id number]) as of 12/27/2018 15:32  Ref. Range 12/26/2018 16:54 12/26/2018 21:14 12/27/2018 06:02 12/27/2018 12:01  Glucose-Capillary Latest Ref Range: 70 - 99 mg/dL 317 (H) 254 (H) 114 (H) 146 (H)   Spoke with patient regarding new diagnosis of diabetes. Of note, patient has received several rounds of steroids since July.  Reviewed patient's current A1c of 7.4%. Explained what a A1c is and what it measures. Also reviewed goal A1c with patient, importance of good glucose control @ home, and blood sugar goals. Reviewed impact of steroids to glucose control especially post prandial values, patho of DM, role of pancreas, vascular changes and comorbidites.  Patient will need a meter at discharge. Blood glucose meter kit (includes lancets and strips) ([id number]). Encouraged patient to check glucose twice per day and focus on one in the evening, as trends are higher following lunch and dinner. Reviewed the importance of being mindful of carb intake and when to call MD.  Secure chat sent to RN to begin working with patient on checking CBGs. Also, sent secure chat to MD to discuss Glipizide for outpatient therapy, given patient is on steroid taper. Patient has no additional questions at this time.   Thanks, Bronson Curb, MSN, RNC-OB Diabetes Coordinator [phone] (8a-5p)

## 2018-12-28 LAB — GLUCOSE, CAPILLARY: Glucose-Capillary: 81 mg/dL (ref 70–99)

## 2018-12-28 NOTE — Progress Notes (Signed)
Patient had an episode of bloody stool, denies discomfort or pain, monitor, VS monitor

## 2018-12-28 NOTE — Plan of Care (Signed)
Pt.'s daughter was notify about the bloody stools that occurs on the last couple days.Pt. is ready to go home with his family.

## 2018-12-28 NOTE — Progress Notes (Signed)
David [name] PHYSICAL MEDICINE & REHABILITATION PROGRESS NOTE   Subjective/Complaints: Patient seen laying in bed this morning.  He states he slept well overnight.  He is eager to go home.  ROS: Denies CP, SOB, N/V/D  Objective:   No results found. Recent Labs    12/26/18 0649  WBC 0.8*  HGB 10.5*  HCT 29.0*  PLT 153   Recent Labs    12/26/18 0649  NA 132*  K 5.1  CL 100  CO2 21*  GLUCOSE 124*  BUN 76*  CREATININE 1.76*  CALCIUM 8.4*    Intake/Output Summary (Last 24 hours) at 12/28/2018 1333 Last data filed at 12/28/2018 0900 Gross per 24 hour  Intake 840 ml  Output 2450 ml  Net -1610 ml     Physical Exam: Vital Signs Blood pressure 113/85, pulse 80, temperature 97.6 F (36.4 C), temperature source Oral, resp. rate 18, height _0  (1.753 m), weight 92.2 kg, SpO2 98 %. Constitutional: NAD.  Vital signs reviewed. HENT: Normocephalic.  Atraumatic. Eyes: EOMI. No discharge. Cardiovascular: No JVD. Respiratory: Normal effort.  No stridor. GI: Non-distended. Skin: Ulcer under right first met head, not examined today Psych: Slow processing, stable/improving Musc: Tenderness to palpation under right first met head-improved Neurologic: Alert Motor: 5/5 throughout, unchanged  Assessment/Plan: 1. Functional deficits secondary to Debility with recent cellulitis and gout flare x2- previous and current which require 3+ hours per day of interdisciplinary therapy in a comprehensive inpatient rehab setting.  Physiatrist is providing close team supervision and 24 hour management of active medical problems listed below.  Physiatrist and rehab team continue to assess barriers to discharge/monitor patient progress toward functional and medical goals  Care Tool:  Bathing    Body parts bathed by patient: Right arm, Left arm, Chest, Abdomen, Front perineal area, Right upper leg, Left upper leg, Face, Buttocks, Right lower leg, Left lower leg   Body parts bathed by helper:  Right lower leg, Left lower leg     Bathing assist Assist Level: Supervision/Verbal cueing     Upper Body Dressing/Undressing Upper body dressing   What is the patient wearing?: Pull over shirt    Upper body assist Assist Level: Set up assist    Lower Body Dressing/Undressing Lower body dressing      What is the patient wearing?: Underwear/pull up, Pants     Lower body assist Assist for lower body dressing: Supervision/Verbal cueing     Toileting Toileting    Toileting assist Assist for toileting: Supervision/Verbal cueing     Transfers Chair/bed transfer  Transfers assist     Chair/bed transfer assist level: Supervision/Verbal cueing     Locomotion Ambulation   Ambulation assist      Assist level: Supervision/Verbal cueing Assistive device: Walker-rolling Max distance: 150   Walk 10 feet activity   Assist     Assist level: Supervision/Verbal cueing Assistive device: Walker-rolling   Walk 50 feet activity   Assist Walk 50 feet with 2 turns activity did not occur: Safety/medical concerns  Assist level: Supervision/Verbal cueing Assistive device: Walker-rolling    Walk 150 feet activity   Assist Walk 150 feet activity did not occur: Safety/medical concerns  Assist level: Supervision/Verbal cueing Assistive device: Walker-rolling    Walk 10 feet on uneven surface  activity   Assist Walk 10 feet on uneven surfaces activity did not occur: Safety/medical concerns   Assist level: Supervision/Verbal cueing Assistive device: Aeronautical engineer Will patient use wheelchair at discharge?:  No Type of Wheelchair: Manual    Wheelchair assist level: Minimal Assistance - Patient > 75% Max wheelchair distance: 150'    Wheelchair 50 feet with 2 turns activity    Assist    Wheelchair 50 feet with 2 turns activity did not occur: Safety/medical concerns   Assist Level: Minimal Assistance - Patient > 75%    Wheelchair 150 feet activity     Assist  Wheelchair 150 feet activity did not occur: Safety/medical concerns   Assist Level: Minimal Assistance - Patient > 75%   Blood pressure 113/85, pulse 80, temperature 97.6 F (36.4 C), temperature source Oral, resp. rate 18, height _0  (1.753 m), weight 92.2 kg, SpO2 98 %.  Medical Problem List and Plan: 1.Debilitysecondary to generalized weakness related to recent cellulitis with gout flareup as well as history of malignant melanoma with metastasis to the brain  DC today  Will see patient for transitional care management in 1-2 weeks post-discharge 2. Antithrombotics: -DVT/anticoagulation:Lovenox -antiplatelet therapy: Aspirin 81 mg daily 3. Pain Management:Tylenol as needed 4. Mood:Celexa 10 mg daily. Provide emotional support  Added Ritalin  -antipsychotic agents: N/A 5. Neuropsych: This patientis not fullycapable of making decisions on hisown behalf. 6. Skin/Wound Care:Routine skin checks 7. Fluids/Electrolytes/Nutrition:Routine in and outs 8. Foot pain ?related to recurrent  Gout +/- neuropathic.   On prednisone taper   Gabapentin 100 BID with improvement, continue as outpatient 9. 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 Status post craniotomy 2017 with chemotherapy as well as radiation. Followed by Dr. Burr Medico as well as Dr. Mickeal Skinner 10. Leukopenia  WBCs 0.8 on 9/17  Discussed with oncology, plan to continue current treatment with follow-up biopsy as outpatient, appreciate recs. 11. Diabetes mellitus with hyperglycemia. Hemoglobin A1c 7.4.  CBG (last 3)  Recent Labs    12/27/18 1637 12/27/18 2123 12/28/18 0702  GLUCAP 307* 281* 81   Remains labile while on steroids, follow-up as outpatient  Diet changed to carb modified 12. Hypertension. Norvasc 5 mg daily, Toprol 75 mg  twice daily. Monitor with increased mobility Vitals:   12/27/18 2034 12/28/18 0313  BP: 117/75 113/85  Pulse: 79 80  Resp: 18 18  Temp: 98.5 F (36.9 C) 97.6 F (36.4 C)  SpO2: 96% 98%   Controlled on 9/19 13. AKI superimposed on CKD stage III.   Cr. 1.76 on 9/17  Cont tor monitor 14. CAD with CABG. Continue aspirin therapy no chest pain 15. Hypothyroidism. Synthroid 16. Hyperlipidemia. Crestor 17. Toothache  Dental pain- intermittent ,dental consult ordered per family request, prior hx of trigeminal neuralgia on opposite side (left) per report  9/16- likely not dental- could be trigeminal neuralgia on opposite side- doing better as well with steroids.  Improving 18.  Elevated temp: Resolved 19. Hx of Gout-   9/14- increased Prednisone to 50 mg day x 3 days then 40 mg day x 3 days then 20 mg x 3 days then 10 mg x3days  LOS: 10 days A FACE TO FACE EVALUATION WAS PERFORMED  Ankit Lorie Phenix 12/28/2018, 1:33 PM

## 2018-12-30 ENCOUNTER — Telehealth: Payer: Self-pay | Admitting: Registered Nurse

## 2018-12-30 NOTE — Telephone Encounter (Signed)
Transitional Care call Transitional Questions Answered by Dr. Silas [place] ( daughter)  Patient name: David [name]  DOB: 1940-11-16 1. Are you/is patient experiencing any problems since coming home? N0 a. Are there any questions regarding any aspect of care? No 2. Are there any questions regarding medications administration/dosing? No a. Are meds being taken as prescribed? Yes b. "Patient should review meds with caller to confirm" Medication List Reviewed.  3. Have there been any falls? No 4. Has Home Health been to the house and/or have they contacted you? Yes, [place] a. If not, have you tried to contact them? NA b. Can we help you contact them? NA 5. Are bowels and bladder emptying properly? Yes. Dr. [name] ( daughter) states she was told by nurse when she picked up her father he was having bloody stools, Discharge summary was reviewed no mention of Melana. [name] has an appointment with his PCP in 12/31/2018, she reports. Also states no mention of bloody stools at this time.  a. Are there any unexpected incontinence issues? No b. If applicable, is patient following bowel/bladder programs? NA 6. Any fevers, problems with breathing, unexpected pain? No 7. Are there any skin problems or new areas of breakdown? No 8. Has the patient/family member arranged specialty MD follow up (ie cardiology/neurology/renal/surgical/etc.)?  Dr. [name] was informed to call Dr. Mickeal Skinner and Dr. Burr Medico,  For [place] appointments. She verbalizes understanding.  a. Can we help arrange? No 9. Does the patient need any other services or support that we can help arrange? Dr. [name] asked if Dr. Dagoberto Ligas would discontinue [name]. I spoke with Dr. Dagoberto Ligas regarding the above, she states the Trental can be discontinued. [facility] was called regarding the above. This provider placed a call to Dr. [name] ( daughter) regarding Dr. Dagoberto Ligas decsion regarding the Trental. She was very  appreciative.  10. Are caregivers following through as expected in assisting the patient? Yes 11. Has the patient quit smoking, drinking alcohol, or using drugs as recommended? (                        )  Appointment date/time arrival time 01/07/2019, arrival time 11:20 for 11:40 appointment with Dr. Dagoberto Ligas. At [facility]

## 2018-12-31 ENCOUNTER — Encounter: Payer: Self-pay | Admitting: Family Medicine

## 2018-12-31 ENCOUNTER — Other Ambulatory Visit: Payer: Self-pay

## 2018-12-31 ENCOUNTER — Ambulatory Visit (INDEPENDENT_AMBULATORY_CARE_PROVIDER_SITE_OTHER): Payer: Medicare Other | Admitting: Family Medicine

## 2018-12-31 VITALS — Ht 69.5 in | Wt 200.0 lb

## 2018-12-31 DIAGNOSIS — G3189 Other specified degenerative diseases of nervous system: Secondary | ICD-10-CM

## 2018-12-31 DIAGNOSIS — F09 Unspecified mental disorder due to known physiological condition: Secondary | ICD-10-CM

## 2018-12-31 DIAGNOSIS — R739 Hyperglycemia, unspecified: Secondary | ICD-10-CM

## 2018-12-31 DIAGNOSIS — E1169 Type 2 diabetes mellitus with other specified complication: Secondary | ICD-10-CM | POA: Diagnosis not present

## 2018-12-31 DIAGNOSIS — N183 Chronic kidney disease, stage 3 unspecified: Secondary | ICD-10-CM

## 2018-12-31 DIAGNOSIS — M109 Gout, unspecified: Secondary | ICD-10-CM | POA: Diagnosis not present

## 2018-12-31 DIAGNOSIS — S069X9S Unspecified intracranial injury with loss of consciousness of unspecified duration, sequela: Secondary | ICD-10-CM

## 2018-12-31 DIAGNOSIS — C61 Malignant neoplasm of prostate: Secondary | ICD-10-CM | POA: Diagnosis not present

## 2018-12-31 DIAGNOSIS — Z7289 Other problems related to lifestyle: Secondary | ICD-10-CM | POA: Diagnosis not present

## 2018-12-31 DIAGNOSIS — E039 Hypothyroidism, unspecified: Secondary | ICD-10-CM

## 2018-12-31 DIAGNOSIS — D708 Other neutropenia: Secondary | ICD-10-CM | POA: Diagnosis not present

## 2018-12-31 DIAGNOSIS — D696 Thrombocytopenia, unspecified: Secondary | ICD-10-CM

## 2018-12-31 DIAGNOSIS — R5381 Other malaise: Secondary | ICD-10-CM | POA: Diagnosis not present

## 2018-12-31 DIAGNOSIS — M1039 Gout due to renal impairment, multiple sites: Secondary | ICD-10-CM

## 2018-12-31 DIAGNOSIS — E1165 Type 2 diabetes mellitus with hyperglycemia: Secondary | ICD-10-CM | POA: Diagnosis not present

## 2018-12-31 DIAGNOSIS — K921 Melena: Secondary | ICD-10-CM

## 2018-12-31 DIAGNOSIS — S069X0S Unspecified intracranial injury without loss of consciousness, sequela: Secondary | ICD-10-CM

## 2018-12-31 DIAGNOSIS — F331 Major depressive disorder, recurrent, moderate: Secondary | ICD-10-CM

## 2018-12-31 DIAGNOSIS — D649 Anemia, unspecified: Secondary | ICD-10-CM

## 2018-12-31 DIAGNOSIS — Z7982 Long term (current) use of aspirin: Secondary | ICD-10-CM | POA: Diagnosis not present

## 2018-12-31 DIAGNOSIS — R531 Weakness: Secondary | ICD-10-CM

## 2018-12-31 DIAGNOSIS — D709 Neutropenia, unspecified: Secondary | ICD-10-CM

## 2018-12-31 DIAGNOSIS — C439 Malignant melanoma of skin, unspecified: Secondary | ICD-10-CM

## 2018-12-31 DIAGNOSIS — Z7984 Long term (current) use of oral hypoglycemic drugs: Secondary | ICD-10-CM | POA: Diagnosis not present

## 2018-12-31 DIAGNOSIS — T380X5A Adverse effect of glucocorticoids and synthetic analogues, initial encounter: Secondary | ICD-10-CM

## 2018-12-31 DIAGNOSIS — N179 Acute kidney failure, unspecified: Secondary | ICD-10-CM | POA: Diagnosis not present

## 2018-12-31 DIAGNOSIS — F1090 Alcohol use, unspecified, uncomplicated: Secondary | ICD-10-CM

## 2018-12-31 DIAGNOSIS — Z789 Other specified health status: Secondary | ICD-10-CM

## 2018-12-31 MED ORDER — PREDNISONE 5 MG PO TABS: 5.0000 mg | ORAL_TABLET | Freq: Every day | ORAL | 0 refills | Status: DC

## 2018-12-31 NOTE — Progress Notes (Signed)
David [name]. - 78 y.o. male  MRN [id number]  Date of Birth: [birth year]  PCP: [name], MD  This service was provided via telemedicine. Phone Visit performed on 12/31/2018    Rationale for phone visit along with limitations reviewed. Patient consented to telephone encounter.    Location of patient: home  Location of provider: in office, [place] @ [facility]  Name of referring provider: N/A    Names of persons and role in encounter: Provider: [name], MD  Patient: David [name].  Other: wife also present on call. He asks I touch base with daughter David [name] as well - [phone]   Time on call: 8:39am - 9:05am  Subjective: Chief Complaint  Patient presents with  . Hospitalization Follow-up     HPI:  Hospitalized 12/08/2018 for 4 days with neutropenia with fever, AMS, sepsis thought due to UTI vs R foot cellulitis, treated with IV vanc/cefempime. Cultured negative, Covid tested negative. He did receive Granix x1 due to neutropenia. Thought to have mild acute CHF exacerbation as well. Discharged to complete doxycycline course. For possible L wrist gout flare, treated with short prednisone burst. Did not have hosp f/u visit (pt declined).   Hospitalized again 12/16/2018 with progressive decline in status with polyuria, lethargy, weakness with hyperglycemia to 400s, and new L foot pain thought again acute gout flare. A1c 7.4% - new onset diabetes (prior prediabetic). Discharged to CIR for comprehensive PT/OT/ST for recent debility. On celexa and ritalin. Placed on gabapentin for foot pain presumed due to gout, treated with prednisone taper as well. For noted progressive neutropenia, planned outpatient BMB with hematology. New diabetes treated with glipizide 5mg  1/2 tab BID. rec close supervision for safety.   New dx DM - has glucometer at home but has difficulty checking. Doesn't know brand.   Recent dx prostate cancer by biopsy 10/2018 on testosterone  supplementation through urology [facility]). Plan was for CT for staging and to discuss continued testosterone (not recommended) with family.  Pathology: 8/12 cores positive for adenocarcinoma the prostate. Bilateral disease present with 4 cores showing Gleason 3+4 adenocarcinoma involving 25-95% of submitted tissue.  The remaining 4 cores were positive for Gleason 3+3 adenocarcinoma.    Continues living at [facility] independent living with wife (also with memory issues). Support MWF 10-2pm, Tu/Th 9-12, and Sat/Sunday will also have support.   Spoke with daughter as well - doing well with ritalin in place of testosterone for energy levels. Gout flares markedly affecting quality of life - desires to avoid flares at all cost.   Admit date: 12/16/2018 Discharge date: 12/18/2018 Admitted From: Assisted living Disposition: CIR  Recommendations for Outpatient Follow-up:  1. Follow up with PCP in 1-2 weeks 2. Please obtain BMP/CBC in one week  Discharge Condition: Stable CODE STATUS: Full Diet recommendation: Diabetic diet  Discharge Diagnoses:  Principal Problem:   DM type 2 (diabetes mellitus, type 2) ([place]) Active Problems:   Benign essential HTN   OSA on CPAP   Biventricular ICD (implantable cardioverter-defibrillator) in place   Hemorrhagic stroke ([place])   Malignant melanoma ([place])   Metastasis to brain ([place])   Thrombocytopenia ([place])   Weakness generalized   AKI (acute kidney injury) ([place])  CIR discharge summary: Admit date: 12/18/2018 Discharge date: 12/28/2018 TCM hosp f/u phone call not performed.   Discharge Diagnoses:  Principal Problem:   Debility Active Problems:   Benign essential HTN   OSA on CPAP   Chronic kidney disease, stage III (moderate) (  [place])   Cognitive and neurobehavioral dysfunction following brain injury ([place])   Acute gout due to renal impairment involving multiple sites   Cellulitis   Controlled type 2 diabetes mellitus with hyperglycemia, without  long-term current use of insulin (HCC) Stage IV malignant melanoma metastatic disease to the brain and lung as well as history of nontraumatic intracerebral hemorrhage with right hemiparesis status post craniotomy 2017 with chemotherapy and radiation Leukopenia CAD with CABG Hypothyroidism Hyperlipidemia Toothache Recurrent gout   Objective/Observations:  No physical exam or vital signs collected unless specifically identified below.   Ht 5' 9.5" (1.765 m)   Wt 200 lb (90.7 kg)   BMI 29.11 kg/m    Respiratory status: speaks in complete sentences without evident shortness of breath.   Lab Results  Component Value Date   HGBA1C 7.4 (H) 12/16/2018    Lab Results  Component Value Date   CREATININE 1.76 (H) 12/26/2018   BUN 76 (H) 12/26/2018   NA 132 (L) 12/26/2018   K 5.1 12/26/2018   CL 100 12/26/2018   CO2 21 (L) 12/26/2018    Lab Results  Component Value Date   WBC 0.8 (LL) 12/26/2018   HGB 10.5 (L) 12/26/2018   HCT 29.0 (L) 12/26/2018   MCV 93.2 12/26/2018   PLT 153 12/26/2018    Lab Results  Component Value Date   TSH 10.506 (H) 12/16/2018    Lab Results  Component Value Date   LABURIC 7.6 12/10/2018    Assessment/Plan:  Acquired hypothyroidism Possible sick euthyroid on recent testing in hospital.  Update TFTs when returns for labs later this week.  He continues levothyroxine 165mcg daily.   Acute gout due to renal impairment involving multiple sites Recent gout flare, recurred after short prednisone course. Now completing extended taper. Daughter states this has significantly affected quality of life - will work towards preventing future flares. First gout flare occurred 11/2018 (L wrist)   Alcohol use Daughter endorses significant decrease.   Chronic kidney disease, stage III (moderate) (HCC) Recent acute on chronic kidney disease - update kidney function with labs this week.   Cognitive and neurobehavioral dysfunction following brain injury ([place])  Appreciate neuro-onc care. Has been started on scheduled ritalin with marked benefit in energy levels - will hopefully be able to continue off testosterone.   Controlled type 2 diabetes mellitus with hyperglycemia, without long-term current use of insulin (HCC) New diagnosis - anticipate steroid induced (A1c was in prediabetic range a few months ago). He was stared on glipizide 2.5mg  BID with meals due to hyperglycemia with benefit - may be able to come off pending cbg's off steroid (completing taper this weekend). Daughter will monitor sugars closely and send me staff message via mychart.   Prostate cancer [facility]) Recent diagnosis (10/2018). Appreciate urology care. staging CT scan was postponed due to recent hospitalizations. Planning to have tumor board next month to review case. Will await recommendations.   Hematochezia Recent episodes, intermittent, sound self limited in h/o hemorrhoids. Will check CBC as well as iron studies when he comes in later this week for labwork. Consider holding aspirin - will continue for now  Malignant melanoma ([place]) Known metastatic melanoma, followed regularly by onc.  MDD (major depressive disorder), recurrent episode, moderate (HCC) Continue celexa 10mg  daily.   Neutropenia ([place]) Of unclear cause.  Received granix during initial hospitalization.  Update CBC.  Considering BMB through onc.   Steroid-induced hyperglycemia See above.   Thrombocytopenia ([place]) Update CBC.   General weakness Per daughter  slowly improving. Has caregiver that is available every day.    I discussed the assessment and treatment plan with the patient. The patient was provided an opportunity to ask questions and all were answered. The patient agreed with the plan and demonstrated an understanding of the instructions.   Lab Orders     Basic metabolic panel     CBC with Differential/Platelet     TSH     T4, free     Ferritin     IBC panel     Hepatic function panel      Uric acid  Meds ordered this encounter  Medications  . predniSONE (DELTASONE) 5 MG tablet    Sig: Take 1 tablet (5 mg total) by mouth daily with breakfast. For gout flare over weekend if needed    Dispense:  3 tablet    Refill:  0    The patient was advised to call back or seek an in-person evaluation if the symptoms worsen or if the condition fails to improve as anticipated.  [name], MD

## 2019-01-02 ENCOUNTER — Other Ambulatory Visit: Payer: Self-pay

## 2019-01-02 ENCOUNTER — Encounter (HOSPITAL_COMMUNITY): Payer: Self-pay

## 2019-01-02 ENCOUNTER — Telehealth: Payer: Self-pay | Admitting: Radiology

## 2019-01-02 ENCOUNTER — Inpatient Hospital Stay (HOSPITAL_COMMUNITY)
Admission: EM | Admit: 2019-01-02 | Discharge: 2019-01-10 | DRG: 808 | Disposition: A | Discharge status: Home Health | Payer: Medicare Other | Attending: Student | Admitting: Student

## 2019-01-02 ENCOUNTER — Emergency Department (HOSPITAL_COMMUNITY): Payer: Medicare Other

## 2019-01-02 ENCOUNTER — Telehealth: Payer: Self-pay | Admitting: Family Medicine

## 2019-01-02 ENCOUNTER — Other Ambulatory Visit (INDEPENDENT_AMBULATORY_CARE_PROVIDER_SITE_OTHER): Payer: Medicare Other

## 2019-01-02 DIAGNOSIS — J9811 Atelectasis: Secondary | ICD-10-CM | POA: Diagnosis present

## 2019-01-02 DIAGNOSIS — R739 Hyperglycemia, unspecified: Secondary | ICD-10-CM | POA: Diagnosis not present

## 2019-01-02 DIAGNOSIS — D709 Neutropenia, unspecified: Secondary | ICD-10-CM | POA: Diagnosis not present

## 2019-01-02 DIAGNOSIS — N401 Enlarged prostate with lower urinary tract symptoms: Secondary | ICD-10-CM | POA: Diagnosis present

## 2019-01-02 DIAGNOSIS — D708 Other neutropenia: Secondary | ICD-10-CM

## 2019-01-02 DIAGNOSIS — E872 Acidosis: Secondary | ICD-10-CM | POA: Diagnosis not present

## 2019-01-02 DIAGNOSIS — E1122 Type 2 diabetes mellitus with diabetic chronic kidney disease: Secondary | ICD-10-CM | POA: Diagnosis present

## 2019-01-02 DIAGNOSIS — Z888 Allergy status to other drugs, medicaments and biological substances status: Secondary | ICD-10-CM

## 2019-01-02 DIAGNOSIS — E1165 Type 2 diabetes mellitus with hyperglycemia: Secondary | ICD-10-CM

## 2019-01-02 DIAGNOSIS — I442 Atrioventricular block, complete: Secondary | ICD-10-CM | POA: Diagnosis not present

## 2019-01-02 DIAGNOSIS — R5081 Fever presenting with conditions classified elsewhere: Secondary | ICD-10-CM

## 2019-01-02 DIAGNOSIS — I255 Ischemic cardiomyopathy: Secondary | ICD-10-CM | POA: Diagnosis present

## 2019-01-02 DIAGNOSIS — M898X7 Other specified disorders of bone, ankle and foot: Secondary | ICD-10-CM | POA: Diagnosis present

## 2019-01-02 DIAGNOSIS — M1039 Gout due to renal impairment, multiple sites: Secondary | ICD-10-CM

## 2019-01-02 DIAGNOSIS — R0602 Shortness of breath: Secondary | ICD-10-CM

## 2019-01-02 DIAGNOSIS — R3911 Hesitancy of micturition: Secondary | ICD-10-CM | POA: Diagnosis not present

## 2019-01-02 DIAGNOSIS — Z87891 Personal history of nicotine dependence: Secondary | ICD-10-CM

## 2019-01-02 DIAGNOSIS — R05 Cough: Secondary | ICD-10-CM | POA: Diagnosis not present

## 2019-01-02 DIAGNOSIS — I959 Hypotension, unspecified: Secondary | ICD-10-CM | POA: Diagnosis present

## 2019-01-02 DIAGNOSIS — R58 Hemorrhage, not elsewhere classified: Secondary | ICD-10-CM | POA: Diagnosis not present

## 2019-01-02 DIAGNOSIS — Z95810 Presence of automatic (implantable) cardiac defibrillator: Secondary | ICD-10-CM

## 2019-01-02 DIAGNOSIS — D649 Anemia, unspecified: Secondary | ICD-10-CM | POA: Diagnosis present

## 2019-01-02 DIAGNOSIS — C61 Malignant neoplasm of prostate: Secondary | ICD-10-CM | POA: Diagnosis present

## 2019-01-02 DIAGNOSIS — Z79899 Other long term (current) drug therapy: Secondary | ICD-10-CM

## 2019-01-02 DIAGNOSIS — Z8673 Personal history of transient ischemic attack (TIA), and cerebral infarction without residual deficits: Secondary | ICD-10-CM

## 2019-01-02 DIAGNOSIS — Z801 Family history of malignant neoplasm of trachea, bronchus and lung: Secondary | ICD-10-CM

## 2019-01-02 DIAGNOSIS — I129 Hypertensive chronic kidney disease with stage 1 through stage 4 chronic kidney disease, or unspecified chronic kidney disease: Secondary | ICD-10-CM | POA: Diagnosis present

## 2019-01-02 DIAGNOSIS — Z7982 Long term (current) use of aspirin: Secondary | ICD-10-CM

## 2019-01-02 DIAGNOSIS — C439 Malignant melanoma of skin, unspecified: Secondary | ICD-10-CM | POA: Diagnosis present

## 2019-01-02 DIAGNOSIS — E7800 Pure hypercholesterolemia, unspecified: Secondary | ICD-10-CM | POA: Diagnosis present

## 2019-01-02 DIAGNOSIS — D61818 Other pancytopenia: Secondary | ICD-10-CM | POA: Diagnosis present

## 2019-01-02 DIAGNOSIS — N281 Cyst of kidney, acquired: Secondary | ICD-10-CM | POA: Diagnosis present

## 2019-01-02 DIAGNOSIS — Z8546 Personal history of malignant neoplasm of prostate: Secondary | ICD-10-CM

## 2019-01-02 DIAGNOSIS — E039 Hypothyroidism, unspecified: Secondary | ICD-10-CM | POA: Diagnosis not present

## 2019-01-02 DIAGNOSIS — I2510 Atherosclerotic heart disease of native coronary artery without angina pectoris: Secondary | ICD-10-CM | POA: Diagnosis present

## 2019-01-02 DIAGNOSIS — M25571 Pain in right ankle and joints of right foot: Secondary | ICD-10-CM | POA: Diagnosis present

## 2019-01-02 DIAGNOSIS — R52 Pain, unspecified: Secondary | ICD-10-CM

## 2019-01-02 DIAGNOSIS — R339 Retention of urine, unspecified: Secondary | ICD-10-CM

## 2019-01-02 DIAGNOSIS — Z20828 Contact with and (suspected) exposure to other viral communicable diseases: Secondary | ICD-10-CM | POA: Diagnosis not present

## 2019-01-02 DIAGNOSIS — R531 Weakness: Secondary | ICD-10-CM | POA: Diagnosis not present

## 2019-01-02 DIAGNOSIS — I119 Hypertensive heart disease without heart failure: Secondary | ICD-10-CM | POA: Diagnosis present

## 2019-01-02 DIAGNOSIS — Z9989 Dependence on other enabling machines and devices: Secondary | ICD-10-CM

## 2019-01-02 DIAGNOSIS — Z515 Encounter for palliative care: Secondary | ICD-10-CM | POA: Diagnosis present

## 2019-01-02 DIAGNOSIS — Z8261 Family history of arthritis: Secondary | ICD-10-CM

## 2019-01-02 DIAGNOSIS — Z7984 Long term (current) use of oral hypoglycemic drugs: Secondary | ICD-10-CM

## 2019-01-02 DIAGNOSIS — C9241 Acute promyelocytic leukemia, in remission: Secondary | ICD-10-CM

## 2019-01-02 DIAGNOSIS — E871 Hypo-osmolality and hyponatremia: Secondary | ICD-10-CM | POA: Diagnosis not present

## 2019-01-02 DIAGNOSIS — D702 Other drug-induced agranulocytosis: Secondary | ICD-10-CM

## 2019-01-02 DIAGNOSIS — I619 Nontraumatic intracerebral hemorrhage, unspecified: Secondary | ICD-10-CM | POA: Diagnosis not present

## 2019-01-02 DIAGNOSIS — C7800 Secondary malignant neoplasm of unspecified lung: Secondary | ICD-10-CM | POA: Diagnosis present

## 2019-01-02 DIAGNOSIS — K219 Gastro-esophageal reflux disease without esophagitis: Secondary | ICD-10-CM | POA: Diagnosis present

## 2019-01-02 DIAGNOSIS — D469 Myelodysplastic syndrome, unspecified: Secondary | ICD-10-CM

## 2019-01-02 DIAGNOSIS — I459 Conduction disorder, unspecified: Secondary | ICD-10-CM | POA: Diagnosis present

## 2019-01-02 DIAGNOSIS — Z7952 Long term (current) use of systemic steroids: Secondary | ICD-10-CM

## 2019-01-02 DIAGNOSIS — Z951 Presence of aortocoronary bypass graft: Secondary | ICD-10-CM

## 2019-01-02 DIAGNOSIS — M25472 Effusion, left ankle: Secondary | ICD-10-CM | POA: Diagnosis present

## 2019-01-02 DIAGNOSIS — Z923 Personal history of irradiation: Secondary | ICD-10-CM

## 2019-01-02 DIAGNOSIS — N179 Acute kidney failure, unspecified: Secondary | ICD-10-CM

## 2019-01-02 DIAGNOSIS — A689 Relapsing fever, unspecified: Secondary | ICD-10-CM | POA: Diagnosis present

## 2019-01-02 DIAGNOSIS — I252 Old myocardial infarction: Secondary | ICD-10-CM

## 2019-01-02 DIAGNOSIS — C7931 Secondary malignant neoplasm of brain: Secondary | ICD-10-CM | POA: Diagnosis present

## 2019-01-02 DIAGNOSIS — T466X5A Adverse effect of antihyperlipidemic and antiarteriosclerotic drugs, initial encounter: Secondary | ICD-10-CM | POA: Diagnosis present

## 2019-01-02 DIAGNOSIS — Z8249 Family history of ischemic heart disease and other diseases of the circulatory system: Secondary | ICD-10-CM

## 2019-01-02 DIAGNOSIS — N183 Chronic kidney disease, stage 3 unspecified: Secondary | ICD-10-CM | POA: Diagnosis present

## 2019-01-02 DIAGNOSIS — T380X5A Adverse effect of glucocorticoids and synthetic analogues, initial encounter: Secondary | ICD-10-CM | POA: Diagnosis present

## 2019-01-02 DIAGNOSIS — Z8701 Personal history of pneumonia (recurrent): Secondary | ICD-10-CM

## 2019-01-02 DIAGNOSIS — K5900 Constipation, unspecified: Secondary | ICD-10-CM | POA: Diagnosis not present

## 2019-01-02 DIAGNOSIS — Z9225 Personal history of immunosupression therapy: Secondary | ICD-10-CM

## 2019-01-02 DIAGNOSIS — G4733 Obstructive sleep apnea (adult) (pediatric): Secondary | ICD-10-CM | POA: Diagnosis present

## 2019-01-02 DIAGNOSIS — M7989 Other specified soft tissue disorders: Secondary | ICD-10-CM | POA: Diagnosis not present

## 2019-01-02 DIAGNOSIS — R509 Fever, unspecified: Secondary | ICD-10-CM | POA: Diagnosis not present

## 2019-01-02 DIAGNOSIS — M109 Gout, unspecified: Secondary | ICD-10-CM | POA: Diagnosis present

## 2019-01-02 DIAGNOSIS — M25572 Pain in left ankle and joints of left foot: Secondary | ICD-10-CM | POA: Diagnosis present

## 2019-01-02 DIAGNOSIS — F39 Unspecified mood [affective] disorder: Secondary | ICD-10-CM | POA: Diagnosis present

## 2019-01-02 DIAGNOSIS — M1990 Unspecified osteoarthritis, unspecified site: Secondary | ICD-10-CM | POA: Diagnosis present

## 2019-01-02 DIAGNOSIS — Z79890 Hormone replacement therapy (postmenopausal): Secondary | ICD-10-CM

## 2019-01-02 LAB — COMPREHENSIVE METABOLIC PANEL
ALT: 25 U/L (ref 0–44)
AST: 22 U/L (ref 15–41)
Albumin: 2.8 g/dL — ABNORMAL LOW (ref 3.5–5.0)
Alkaline Phosphatase: 38 U/L (ref 38–126)
Anion gap: 10 (ref 5–15)
BUN: 78 mg/dL — ABNORMAL HIGH (ref 8–23)
CO2: 19 mmol/L — ABNORMAL LOW (ref 22–32)
Calcium: 8.1 mg/dL — ABNORMAL LOW (ref 8.9–10.3)
Chloride: 104 mmol/L (ref 98–111)
Creatinine, Ser: 1.66 mg/dL — ABNORMAL HIGH (ref 0.61–1.24)
GFR calc Af Amer: 45 mL/min — ABNORMAL LOW (ref >60)
GFR calc non Af Amer: 39 mL/min — ABNORMAL LOW (ref >60)
Glucose, Bld: 81 mg/dL (ref 70–99)
Potassium: 4.6 mmol/L (ref 3.5–5.1)
Sodium: 133 mmol/L — ABNORMAL LOW (ref 135–145)
Total Bilirubin: 0.9 mg/dL (ref 0.3–1.2)
Total Protein: 5.2 g/dL — ABNORMAL LOW (ref 6.5–8.1)

## 2019-01-02 LAB — CBC WITH DIFFERENTIAL/PLATELET
Abs Immature Granulocytes: 0 10*3/uL (ref 0.00–0.07)
Basophils Absolute: 0 10*3/uL (ref 0.0–0.1)
Basophils Absolute: 0 10*3/uL (ref 0.0–0.1)
Basophils Relative: 0.4 % (ref 0.0–3.0)
Basophils Relative: 0 %
Eosinophils Absolute: 0 10*3/uL (ref 0.0–0.7)
Eosinophils Absolute: 0.3 10*3/uL (ref 0.0–0.5)
Eosinophils Relative: 2.5 % (ref 0.0–5.0)
Eosinophils Relative: 35 %
HCT: 26.4 % — ABNORMAL LOW (ref 39.0–52.0)
HCT: 24 % — ABNORMAL LOW (ref 39.0–52.0)
Hemoglobin: 9.1 g/dL — ABNORMAL LOW (ref 13.0–17.0)
Hemoglobin: 8.1 g/dL — ABNORMAL LOW (ref 13.0–17.0)
Immature Granulocytes: 0 %
Lymphocytes Relative: 34.4 % (ref 12.0–46.0)
Lymphocytes Relative: 17 %
Lymphs Abs: 0.2 10*3/uL — ABNORMAL LOW (ref 0.7–4.0)
Lymphs Abs: 0.1 10*3/uL — ABNORMAL LOW (ref 0.7–4.0)
MCH: 33.6 pg (ref 26.0–34.0)
MCHC: 34.5 g/dL (ref 30.0–36.0)
MCHC: 33.8 g/dL (ref 30.0–36.0)
MCV: 96.6 fl (ref 78.0–100.0)
MCV: 99.6 fL (ref 80.0–100.0)
Monocytes Absolute: 0.2 10*3/uL (ref 0.1–1.0)
Monocytes Absolute: 0.1 10*3/uL (ref 0.1–1.0)
Monocytes Relative: 22.4 % — ABNORMAL HIGH (ref 3.0–12.0)
Monocytes Relative: 10 %
Neutro Abs: 0.3 10*3/uL — ABNORMAL LOW (ref 1.4–7.7)
Neutro Abs: 0.3 10*3/uL — ABNORMAL LOW (ref 1.7–7.7)
Neutrophils Relative %: 40.3 % — ABNORMAL LOW (ref 43.0–77.0)
Neutrophils Relative %: 38 %
Platelets: 210 10*3/uL (ref 150.0–400.0)
Platelets: 155 10*3/uL (ref 150–400)
RBC: 2.73 Mil/uL — ABNORMAL LOW (ref 4.22–5.81)
RBC: 2.41 MIL/uL — ABNORMAL LOW (ref 4.22–5.81)
RDW: 15.4 % (ref 11.5–15.5)
RDW: 15.1 % (ref 11.5–15.5)
WBC: 0.7 10*3/uL — CL (ref 4.0–10.5)
WBC: 0.8 10*3/uL — CL (ref 4.0–10.5)
nRBC: 0 % (ref 0.0–0.2)

## 2019-01-02 LAB — BASIC METABOLIC PANEL
BUN: 66 mg/dL — ABNORMAL HIGH (ref 6–23)
CO2: 23 mEq/L (ref 19–32)
Calcium: 8.6 mg/dL (ref 8.4–10.5)
Chloride: 103 mEq/L (ref 96–112)
Creatinine, Ser: 1.48 mg/dL (ref 0.40–1.50)
GFR: 45.79 mL/min — ABNORMAL LOW (ref >60.00)
Glucose, Bld: 86 mg/dL (ref 70–99)
Potassium: 4.3 mEq/L (ref 3.5–5.1)
Sodium: 135 mEq/L (ref 135–145)

## 2019-01-02 LAB — TSH: TSH: 2.54 u[IU]/mL (ref 0.35–4.50)

## 2019-01-02 LAB — HEPATIC FUNCTION PANEL
ALT: 22 U/L (ref 0–53)
AST: 16 U/L (ref 0–37)
Albumin: 3.5 g/dL (ref 3.5–5.2)
Alkaline Phosphatase: 45 U/L (ref 39–117)
Bilirubin, Direct: 0.1 mg/dL (ref 0.0–0.3)
Total Bilirubin: 0.5 mg/dL (ref 0.2–1.2)
Total Protein: 5.6 g/dL — ABNORMAL LOW (ref 6.0–8.3)

## 2019-01-02 LAB — IBC PANEL
Iron: 119 ug/dL (ref 42–165)
Saturation Ratios: 39 % (ref 20.0–50.0)
Transferrin: 218 mg/dL (ref 212.0–360.0)

## 2019-01-02 LAB — PROTIME-INR
INR: 1.2 (ref 0.8–1.2)
Prothrombin Time: 15 seconds (ref 11.4–15.2)

## 2019-01-02 LAB — T4, FREE: Free T4: 1.01 ng/dL (ref 0.60–1.60)

## 2019-01-02 LAB — APTT: aPTT: 31 seconds (ref 24–36)

## 2019-01-02 LAB — URIC ACID: Uric Acid, Serum: 8 mg/dL — ABNORMAL HIGH (ref 4.0–7.8)

## 2019-01-02 LAB — LACTIC ACID, PLASMA: Lactic Acid, Venous: 0.9 mmol/L (ref 0.5–1.9)

## 2019-01-02 LAB — FERRITIN: Ferritin: 700.4 ng/mL — ABNORMAL HIGH (ref 22.0–322.0)

## 2019-01-02 MED ORDER — PIPERACILLIN-TAZOBACTAM 3.375 G IVPB 30 MIN
3.3750 g | Freq: Once | INTRAVENOUS | Status: AC
  Administered 2019-01-02: 3.375 g via INTRAVENOUS
  Filled 2019-01-02: qty 50

## 2019-01-02 MED ORDER — SODIUM CHLORIDE 0.9 % IV BOLUS (SEPSIS)
1000.0000 mL | Freq: Once | INTRAVENOUS | Status: AC
  Administered 2019-01-03: 1000 mL via INTRAVENOUS

## 2019-01-02 MED ORDER — SODIUM CHLORIDE 0.9 % IV BOLUS (SEPSIS)
1000.0000 mL | Freq: Once | INTRAVENOUS | Status: AC
  Administered 2019-01-02: 1000 mL via INTRAVENOUS

## 2019-01-02 NOTE — Assessment & Plan Note (Signed)
Daughter endorses significant decrease.

## 2019-01-02 NOTE — Assessment & Plan Note (Addendum)
Recent acute on chronic kidney disease - update kidney function with labs this week.

## 2019-01-02 NOTE — Assessment & Plan Note (Addendum)
Recent episodes, intermittent, sound self limited in h/o hemorrhoids. Will check CBC as well as iron studies when he comes in later this week for labwork. Consider holding aspirin - will continue for now

## 2019-01-02 NOTE — ED Notes (Signed)
Date and time results received: 01/02/19 11:44 PM  (use smartphrase ".now" to insert current time)  Test: WBC Critical Value: 0.8  Name of Provider Notified: Dr.Haviland  Orders Received? Or Actions Taken?:

## 2019-01-02 NOTE — Assessment & Plan Note (Signed)
Per daughter slowly improving. Has caregiver that is available every day.

## 2019-01-02 NOTE — Telephone Encounter (Signed)
Best number [phone]  [facility] Needs approval on plan of care  Nursing orders 2 x week for 2 weeks 1 x week for 2 weeks 1 x every other week for 5 weeks

## 2019-01-02 NOTE — Assessment & Plan Note (Signed)
Continue celexa 10mg  daily.

## 2019-01-02 NOTE — Assessment & Plan Note (Signed)
Possible sick euthyroid on recent testing in hospital.  Update TFTs when returns for labs later this week.  He continues levothyroxine 141mcg daily.

## 2019-01-02 NOTE — Telephone Encounter (Signed)
[facility] called a critical WBC - 0.7. results given to Dr Damita Dunnings

## 2019-01-02 NOTE — Assessment & Plan Note (Addendum)
Recent gout flare, recurred after short prednisone course. Now completing extended taper. Daughter states this has significantly affected quality of life - will work towards preventing future flares. First gout flare occurred 11/2018 (L wrist)

## 2019-01-02 NOTE — Assessment & Plan Note (Signed)
See above

## 2019-01-02 NOTE — ED Triage Notes (Signed)
Pt brought from home by daughter. PT is Neutropenic and had a sudden fever of 101.2. Pt reports taking tylenol around 1930. PT was released from hospital Saturday. Pt reports weakness.

## 2019-01-02 NOTE — Assessment & Plan Note (Signed)
Of unclear cause.  Received granix during initial hospitalization.  Update CBC.  Considering BMB through onc.

## 2019-01-02 NOTE — Assessment & Plan Note (Signed)
New diagnosis - anticipate steroid induced (A1c was in prediabetic range a few months ago). He was stared on glipizide 2.5mg  BID with meals due to hyperglycemia with benefit - may be able to come off pending cbg's off steroid (completing taper this weekend). Daughter will monitor sugars closely and send me staff message via mychart.

## 2019-01-02 NOTE — Assessment & Plan Note (Signed)
Known metastatic melanoma, followed regularly by onc.

## 2019-01-02 NOTE — Telephone Encounter (Signed)
Agree with this. Thank you.  

## 2019-01-02 NOTE — Assessment & Plan Note (Signed)
Update CBC. 

## 2019-01-02 NOTE — Telephone Encounter (Signed)
This is a known issue and I am routing this to the PCP.  I will defer to PCP.

## 2019-01-02 NOTE — Assessment & Plan Note (Signed)
Recent diagnosis (10/2018). Appreciate urology care. staging CT scan was postponed due to recent hospitalizations. Planning to have tumor board next month to review case. Will await recommendations.

## 2019-01-02 NOTE — Assessment & Plan Note (Addendum)
Appreciate neuro-onc care. Has been started on scheduled ritalin with marked benefit in energy levels - will hopefully be able to continue off testosterone.

## 2019-01-02 NOTE — ED Provider Notes (Signed)
[facility] DEPT Provider Note   CSN: [id number] Arrival date & time: 01/02/19  2154     History   Chief Complaint Chief Complaint  Patient presents with   Fever   Weakness    HPI David [name]. is a 78 y.o. male.     Pt presents to the ED today with a neutropenic fever.  Pt has been in and out of the hospital for various things over the last few months.  The pt was pancytopenic in August and treated for foot cellulitis.  The pt was d/c home with doxy.  He was re-admitted on 9/7 for elevated blood sugars.  He had an elevated WBC then which was suspected to be due to Granix.  The pt was d/c on 9/19 from rehab.  He had been well until today when he developed a fever up to 102.  He did take 1000 mg tylenol pta.  His WBC yesterday was 0.7.  Additional hx obtained from his daughter who is a Haematologist.      Past Medical History:  Diagnosis Date   Anginal pain ([place])    Arthritis    mostly hands   Benign familial hematuria    BPH (benign prostatic hypertrophy)    Brain cancer ([place])    melanoma with brain met   Coronary artery disease    GERD (gastroesophageal reflux disease)    High cholesterol    Hypertension    Hypothyroidism    Intracerebral hemorrhage (HCC)    Melanoma ([place])    Metastatic melanoma to lung ([place])    Mood disorder ([place])    Myocardial infarction [facility])    Pacemaker    due to syncope, 3rd degree HB (upgrade to [facility]. Jude CRT-P 02/25/13 (Dr. Uvaldo Rising)   Rectal bleed    due to NSAIDS   Renal disorder    Sepsis ([place]) 12/09/2018   Skin cancer    melanoma   Sleep apnea    uses CPAP   Stroke [facility])     Patient Active Problem List   Diagnosis Date Noted   Steroid-induced hyperglycemia    Essential hypertension    Controlled type 2 diabetes mellitus with hyperglycemia, without long-term current use of insulin ([place])    Acute gout due to renal impairment involving multiple sites    General  weakness 12/18/2018   Neutropenia ([place]) 12/09/2018   Thrombocytopenia ([place]) 12/09/2018   Insomnia 10/14/2018   Heart block AV complete ([place]) 10/01/2018   Presence of biventricular cardiac pacemaker 10/01/2018   Hypogonadism in male 04/18/2018   Dandruff in adult 03/27/2018   Dyslipidemia 03/27/2018   Acute pain of right shoulder 03/11/2018   Prostate cancer ([place]) 12/24/2017   Hx of CABG 07/17/2017   Alcohol use 04/19/2017   Cognitive and neurobehavioral dysfunction following brain injury ([place]) 02/25/2017   History of implantation of joint prosthesis of elbow 02/12/2017   Osteoarthritis of knee 02/12/2017   Nontraumatic cortical hemorrhage of left cerebral hemisphere ([place]) 06/27/2016   Hematochezia 05/06/2016   Advanced care planning/counseling discussion 05/03/2016   Atherosclerosis of native coronary artery of native heart with stable angina pectoris ([place]) 04/24/2016   Ischemic cardiomyopathy 04/24/2016   Acquired hypothyroidism 11/24/2015   Metastatic melanoma to lung [facility])    MDD (major depressive disorder), recurrent episode, moderate (HCC)    Benign prostatic hyperplasia    Metastasis to brain ([place]) 10/15/2015   Malignant melanoma ([place]) 09/22/2015   Lung mass    Chronic kidney disease,  stage III (moderate) (HCC)    Overweight with body mass index (BMI) 25.0-29.9 08/26/2015   Incontinence 08/26/2015   Hemorrhagic stroke ([place]) 08/26/2015   Hemiplegia and hemiparesis following nontraumatic intracerebral hemorrhage affecting right dominant side ([place]) 08/26/2015   Aphasia following nontraumatic intracerebral hemorrhage 08/26/2015   Gait disturbance, post-stroke 08/26/2015   Benign essential HTN    OSA on CPAP    Biventricular ICD (implantable cardioverter-defibrillator) in place    ICH (intracerebral hemorrhage) ([place]) - L frontal due to HTN vs CAA 08/22/2015    Past Surgical History:  Procedure Laterality Date   APPLICATION OF CRANIAL  NAVIGATION N/A 10/15/2015   Procedure: APPLICATION OF CRANIAL NAVIGATION;  Surgeon: Erline Levine, MD;  Location: [facility];  Service: Neurosurgery;  Laterality: N/A;   CARDIAC CATHETERIZATION  2013   CARDIAC SURGERY     bypass X 2   COLONOSCOPY WITH PROPOFOL N/A 05/08/2016   diverticulosis, int hem, no f/u needed [name])   CORONARY ARTERY BYPASS GRAFT  2013   LIMA-LAD, SVG-PDA 03/27/11 (Dr. Francee Gentile)   [place] N/A 10/15/2015   Procedure: LEFT FRONTAL CRANIOTOMY TUMOR EXCISION with Curve;  Surgeon: Erline Levine, MD;  Location: [facility];  Service: Neurosurgery;  Laterality: N/A;  CRANIOTOMY TUMOR EXCISION   EYE SURGERY     FOOT SURGERY  08/2010   JOINT REPLACEMENT Left    partial knee   KNEE ARTHROSCOPY  12/2008   LYMPH NODE BIOPSY     PACEMAKER INSERTION     TONSILLECTOMY          Home Medications    Prior to Admission medications   Medication Sig Start Date End Date Taking? Authorizing Provider  acetaminophen (TYLENOL) 325 MG tablet Take 2 tablets (650 mg total) by mouth every 6 (six) hours as needed for mild pain (or Fever >/= 101). 12/27/18   Angiulli, Lavon Paganini, PA-C  amLODipine (NORVASC) 5 MG tablet Take 1 tablet (5 mg total) by mouth daily. 12/27/18   Angiulli, Lavon Paganini, PA-C  aspirin EC 81 MG tablet Take 81 mg by mouth every evening.     [provider]  blood glucose meter kit and supplies KIT Dispense based on patient and insurance preference. Use up to four times daily as directed. (FOR ICD-9 250.00, 250.01). 12/18/18   Little Ishikawa, MD  blood glucose meter kit and supplies Dispense based on patient and insurance preference. Use up to four times daily as directed. (FOR ICD-10 E10.9, E11.9). 12/26/18   Angiulli, Lavon Paganini, PA-C  blood glucose meter kit and supplies Dispense based on patient and insurance preference. Use up to four times daily as directed. (FOR ICD-10 E10.9, E11.9). 12/26/18   Angiulli, Lavon Paganini, PA-C  Cholecalciferol (VITAMIN D3) 25  MCG (1000 UT) CAPS Take 1 capsule (1,000 Units total) by mouth daily. 12/27/18   Angiulli, Lavon Paganini, PA-C  citalopram (CELEXA) 10 MG tablet Take 1 tablet (10 mg total) by mouth daily. 12/27/18   Angiulli, Lavon Paganini, PA-C  gabapentin (NEURONTIN) 100 MG capsule Take 1 capsule (100 mg total) by mouth 2 (two) times daily. 12/27/18   Angiulli, Lavon Paganini, PA-C  glipiZIDE (GLUCOTROL) 5 MG tablet Take 0.5 tablets (2.5 mg total) by mouth 2 (two) times daily before a meal. 12/27/18 01/26/19  Angiulli, Lavon Paganini, PA-C  levothyroxine (SYNTHROID) 137 MCG tablet TAKE 1 TABLET BY MOUTH ONCE DAILY BEFORE BREAKFAST 12/27/18   Angiulli, Lavon Paganini, PA-C  methylphenidate (RITALIN) 5 MG tablet Take 1 tablet (5 mg total) by  mouth 2 (two) times daily with breakfast and lunch. 12/27/18   Angiulli, Lavon Paganini, PA-C  metoprolol succinate (TOPROL-XL) 50 MG 24 hr tablet TAKE 1 AND 1/2 TABLET (75 MG) BY MOUTH EVERY DAY WITH OR IMMEDIATELY FOLLOWING A MEAL *DO NOT CRUSH* 12/27/18   Angiulli, Lavon Paganini, PA-C  polyethylene glycol (MIRALAX / GLYCOLAX) 17 g packet Take 17 g by mouth 2 (two) times daily. 12/27/18   Angiulli, Lavon Paganini, PA-C  predniSONE (DELTASONE) 10 MG tablet 4 tabs daily x1 day then 2 tabs daily x3 days then 1 tab daily x3 days and stop 12/27/18   Angiulli, Lavon Paganini, PA-C  predniSONE (DELTASONE) 5 MG tablet Take 1 tablet (5 mg total) by mouth daily with breakfast. For gout flare over weekend if needed 12/31/18   [name], MD  rosuvastatin (CRESTOR) 20 MG tablet Take 1 tablet (20 mg total) by mouth at bedtime. 12/27/18   Angiulli, Lavon Paganini, PA-C  senna (SENOKOT) 8.6 MG TABS tablet Take 1 tablet (8.6 mg total) by mouth at bedtime. 12/27/18   Angiulli, Lavon Paganini, PA-C  traZODone (DESYREL) 50 MG tablet Take 0.5-1 tablets (25-50 mg total) by mouth at bedtime. Patient taking differently: Take 25 mg by mouth at bedtime as needed for sleep.  10/14/18   [name], MD  Turmeric 500 MG CAPS Take 1,000 mg by mouth 2 (two) times daily.      [provider]  vitamin E (VITAMIN E) 400 UNIT capsule Take 1 capsule (400 Units total) by mouth 2 (two) times daily. Patient taking differently: Take 800 Units by mouth every evening.  07/24/16   Hayden Pedro, PA-C    Family History Family History  Problem Relation Age of Onset   Cancer Mother 37       lung    Hypertension Mother    Hypertension Father    Heart attack Father    Heart disease Father    Rheum arthritis Sister    Cancer Maternal Grandmother     Social History Social History   Tobacco Use   Smoking status: Former Smoker    Packs/day: 1.00    Years: 3.00    Pack years: 3.00    Quit date: 04/10/1957    Years since quitting: 61.7   Smokeless tobacco: Never Used  Substance Use Topics   Alcohol use: Yes    Alcohol/week: 14.0 standard drinks    Types: 10 Glasses of wine, 4 Cans of beer per week   Drug use: No     Allergies   Ezetimibe and Simvastatin   Review of Systems Review of Systems  Constitutional: Positive for fever.  Neurological: Positive for weakness.  All other systems reviewed and are negative.    Physical Exam Updated Vital Signs BP 96/62    Pulse 76    Temp 98.1 F (36.7 C) (Oral)    Resp (!) 21    SpO2 94%   Physical Exam Vitals signs and nursing note reviewed.  Constitutional:      Appearance: Normal appearance.  HENT:     Head: Normocephalic and atraumatic.     Right Ear: External ear normal.     Left Ear: External ear normal.     Nose: Nose normal.     Mouth/Throat:     Mouth: Mucous membranes are moist.     Pharynx: Oropharynx is clear.  Eyes:     Extraocular Movements: Extraocular movements intact.     Conjunctiva/sclera: Conjunctivae normal.  Pupils: Pupils are equal, round, and reactive to light.  Neck:     Musculoskeletal: Normal range of motion and neck supple.  Cardiovascular:     Rate and Rhythm: Normal rate and regular rhythm.     Pulses: Normal pulses.     Heart sounds:  Normal heart sounds.  Pulmonary:     Effort: Pulmonary effort is normal.     Breath sounds: Normal breath sounds.  Abdominal:     General: Abdomen is flat. Bowel sounds are normal.     Palpations: Abdomen is soft.  Musculoskeletal: Normal range of motion.  Skin:    General: Skin is warm.     Capillary Refill: Capillary refill takes less than 2 seconds.  Neurological:     General: No focal deficit present.     Mental Status: He is alert and oriented to person, place, and time.  Psychiatric:        Mood and Affect: Mood normal.        Behavior: Behavior normal.        Thought Content: Thought content normal.        Judgment: Judgment normal.      ED Treatments / Results  Labs (all labs ordered are listed, but only abnormal results are displayed) Labs Reviewed  CULTURE, BLOOD (ROUTINE X 2)  CULTURE, BLOOD (ROUTINE X 2)  URINE CULTURE  SARS CORONAVIRUS 2 (HOSPITAL ORDER, [facility])  LACTIC ACID, PLASMA  LACTIC ACID, PLASMA  COMPREHENSIVE METABOLIC PANEL  CBC WITH DIFFERENTIAL/PLATELET  APTT  PROTIME-INR  URINALYSIS, ROUTINE W REFLEX MICROSCOPIC    EKG EKG Interpretation  Date/Time:  Thursday January 02 2019 22:22:42 EDT Ventricular Rate:  75 PR Interval:    QRS Duration: 210 QT Interval:  450 QTC Calculation: 503 R Axis:   -124 Text Interpretation:  Sinus rhythm Short PR interval Right bundle branch block No significant change since last tracing Abnormal ECG Confirmed by Carmin Muskrat [id number]) on 01/02/2019 10:28:05 PM   Radiology Dg Chest Port 1 View  Result Date: 01/02/2019 CLINICAL DATA:  Fever. EXAM: PORTABLE CHEST 1 VIEW COMPARISON:  None. FINDINGS: Low lung volumes limit assessment. Post median sternotomy and CABG. Left-sided pacemaker in place. Unchanged cardiomegaly. Bronchovascular crowding versus vascular congestion. Stable right hilar prominence from prior exam. Patchy infrahilar opacities, left greater than right,  nonspecific cough low volume chest. No large pleural effusion. No pneumothorax. IMPRESSION: 1. Low lung volumes with bronchovascular crowding versus vascular congestion. 2. Patchy infrahilar opacities, left greater than right. Limited assessment on low volume exam, favor atelectasis however pneumonia not excluded in the setting of fever. Recommend PA and lateral views on patient is able. 3. Post CABG with mild cardiomegaly, likely accentuated by technique. Electronically Signed   By: Keith Rake M.D.   On: 01/02/2019 22:39    Procedures Procedures (including critical care time)  Medications Ordered in ED Medications  sodium chloride 0.9 % bolus 1,000 mL (1,000 mLs Intravenous New Bag/Given 01/02/19 2253)    And  sodium chloride 0.9 % bolus 1,000 mL (1,000 mLs Intravenous New Bag/Given 01/02/19 2304)    And  sodium chloride 0.9 % bolus 1,000 mL (has no administration in time range)  piperacillin-tazobactam (ZOSYN) IVPB 3.375 g (3.375 g Intravenous New Bag/Given 01/02/19 2303)     Initial Impression / Assessment and Plan / ED Course  I have reviewed the triage vital signs and the nursing notes.  Pertinent labs & imaging results that were available during my care  of the patient were reviewed by me and considered in my medical decision making (see chart for details).    BP soft with neutropenic fever.  Code sepsis called and pt given IV zosyn.  Pt signed out to Dr. Randal Buba at shift change.  Final Clinical Impressions(s) / ED Diagnoses   Final diagnoses:  Neutropenic fever [facility])    ED Discharge Orders    None       Isla Pence, MD 01/02/19 2310

## 2019-01-03 ENCOUNTER — Other Ambulatory Visit: Payer: Self-pay

## 2019-01-03 ENCOUNTER — Encounter (HOSPITAL_COMMUNITY): Payer: Self-pay | Admitting: Internal Medicine

## 2019-01-03 DIAGNOSIS — N179 Acute kidney failure, unspecified: Secondary | ICD-10-CM | POA: Diagnosis not present

## 2019-01-03 DIAGNOSIS — C9240 Acute promyelocytic leukemia, not having achieved remission: Secondary | ICD-10-CM | POA: Diagnosis not present

## 2019-01-03 DIAGNOSIS — Z9989 Dependence on other enabling machines and devices: Secondary | ICD-10-CM | POA: Diagnosis not present

## 2019-01-03 DIAGNOSIS — C7982 Secondary malignant neoplasm of genital organs: Secondary | ICD-10-CM | POA: Diagnosis not present

## 2019-01-03 DIAGNOSIS — I255 Ischemic cardiomyopathy: Secondary | ICD-10-CM

## 2019-01-03 DIAGNOSIS — D709 Neutropenia, unspecified: Secondary | ICD-10-CM | POA: Diagnosis present

## 2019-01-03 DIAGNOSIS — R0602 Shortness of breath: Secondary | ICD-10-CM | POA: Diagnosis not present

## 2019-01-03 DIAGNOSIS — R5081 Fever presenting with conditions classified elsewhere: Secondary | ICD-10-CM

## 2019-01-03 DIAGNOSIS — Z951 Presence of aortocoronary bypass graft: Secondary | ICD-10-CM

## 2019-01-03 DIAGNOSIS — R238 Other skin changes: Secondary | ICD-10-CM | POA: Diagnosis not present

## 2019-01-03 DIAGNOSIS — I2510 Atherosclerotic heart disease of native coronary artery without angina pectoris: Secondary | ICD-10-CM | POA: Diagnosis present

## 2019-01-03 DIAGNOSIS — Z8546 Personal history of malignant neoplasm of prostate: Secondary | ICD-10-CM | POA: Diagnosis not present

## 2019-01-03 DIAGNOSIS — I442 Atrioventricular block, complete: Secondary | ICD-10-CM | POA: Diagnosis present

## 2019-01-03 DIAGNOSIS — E039 Hypothyroidism, unspecified: Secondary | ICD-10-CM

## 2019-01-03 DIAGNOSIS — E1165 Type 2 diabetes mellitus with hyperglycemia: Secondary | ICD-10-CM

## 2019-01-03 DIAGNOSIS — M10079 Idiopathic gout, unspecified ankle and foot: Secondary | ICD-10-CM | POA: Diagnosis not present

## 2019-01-03 DIAGNOSIS — I611 Nontraumatic intracerebral hemorrhage in hemisphere, cortical: Secondary | ICD-10-CM

## 2019-01-03 DIAGNOSIS — E871 Hypo-osmolality and hyponatremia: Secondary | ICD-10-CM | POA: Diagnosis not present

## 2019-01-03 DIAGNOSIS — N183 Chronic kidney disease, stage 3 unspecified: Secondary | ICD-10-CM | POA: Diagnosis present

## 2019-01-03 DIAGNOSIS — G4733 Obstructive sleep apnea (adult) (pediatric): Secondary | ICD-10-CM

## 2019-01-03 DIAGNOSIS — C439 Malignant melanoma of skin, unspecified: Secondary | ICD-10-CM | POA: Diagnosis not present

## 2019-01-03 DIAGNOSIS — I459 Conduction disorder, unspecified: Secondary | ICD-10-CM | POA: Diagnosis present

## 2019-01-03 DIAGNOSIS — M7989 Other specified soft tissue disorders: Secondary | ICD-10-CM | POA: Diagnosis not present

## 2019-01-03 DIAGNOSIS — D61818 Other pancytopenia: Secondary | ICD-10-CM | POA: Diagnosis present

## 2019-01-03 DIAGNOSIS — R509 Fever, unspecified: Secondary | ICD-10-CM | POA: Diagnosis not present

## 2019-01-03 DIAGNOSIS — R21 Rash and other nonspecific skin eruption: Secondary | ICD-10-CM | POA: Diagnosis not present

## 2019-01-03 DIAGNOSIS — Z515 Encounter for palliative care: Secondary | ICD-10-CM | POA: Diagnosis present

## 2019-01-03 DIAGNOSIS — Z7952 Long term (current) use of systemic steroids: Secondary | ICD-10-CM | POA: Diagnosis not present

## 2019-01-03 DIAGNOSIS — Z87891 Personal history of nicotine dependence: Secondary | ICD-10-CM | POA: Diagnosis not present

## 2019-01-03 DIAGNOSIS — M109 Gout, unspecified: Secondary | ICD-10-CM | POA: Diagnosis not present

## 2019-01-03 DIAGNOSIS — Z8673 Personal history of transient ischemic attack (TIA), and cerebral infarction without residual deficits: Secondary | ICD-10-CM | POA: Diagnosis not present

## 2019-01-03 DIAGNOSIS — Z95810 Presence of automatic (implantable) cardiac defibrillator: Secondary | ICD-10-CM | POA: Diagnosis not present

## 2019-01-03 DIAGNOSIS — J9811 Atelectasis: Secondary | ICD-10-CM | POA: Diagnosis present

## 2019-01-03 DIAGNOSIS — Z20828 Contact with and (suspected) exposure to other viral communicable diseases: Secondary | ICD-10-CM | POA: Diagnosis present

## 2019-01-03 DIAGNOSIS — E872 Acidosis: Secondary | ICD-10-CM | POA: Diagnosis not present

## 2019-01-03 DIAGNOSIS — C7931 Secondary malignant neoplasm of brain: Secondary | ICD-10-CM | POA: Diagnosis present

## 2019-01-03 DIAGNOSIS — D469 Myelodysplastic syndrome, unspecified: Secondary | ICD-10-CM | POA: Diagnosis not present

## 2019-01-03 DIAGNOSIS — Z950 Presence of cardiac pacemaker: Secondary | ICD-10-CM | POA: Diagnosis not present

## 2019-01-03 DIAGNOSIS — C61 Malignant neoplasm of prostate: Secondary | ICD-10-CM | POA: Diagnosis present

## 2019-01-03 DIAGNOSIS — A689 Relapsing fever, unspecified: Secondary | ICD-10-CM | POA: Diagnosis present

## 2019-01-03 DIAGNOSIS — C7800 Secondary malignant neoplasm of unspecified lung: Secondary | ICD-10-CM | POA: Diagnosis present

## 2019-01-03 DIAGNOSIS — Z872 Personal history of diseases of the skin and subcutaneous tissue: Secondary | ICD-10-CM | POA: Diagnosis not present

## 2019-01-03 DIAGNOSIS — Z888 Allergy status to other drugs, medicaments and biological substances status: Secondary | ICD-10-CM | POA: Diagnosis not present

## 2019-01-03 DIAGNOSIS — M25572 Pain in left ankle and joints of left foot: Secondary | ICD-10-CM | POA: Diagnosis not present

## 2019-01-03 DIAGNOSIS — I619 Nontraumatic intracerebral hemorrhage, unspecified: Secondary | ICD-10-CM | POA: Diagnosis present

## 2019-01-03 LAB — COMPREHENSIVE METABOLIC PANEL
ALT: 23 U/L (ref 0–44)
AST: 16 U/L (ref 15–41)
Albumin: 2.4 g/dL — ABNORMAL LOW (ref 3.5–5.0)
Alkaline Phosphatase: 35 U/L — ABNORMAL LOW (ref 38–126)
Anion gap: 9 (ref 5–15)
BUN: 68 mg/dL — ABNORMAL HIGH (ref 8–23)
CO2: 16 mmol/L — ABNORMAL LOW (ref 22–32)
Calcium: 7.5 mg/dL — ABNORMAL LOW (ref 8.9–10.3)
Chloride: 110 mmol/L (ref 98–111)
Creatinine, Ser: 1.58 mg/dL — ABNORMAL HIGH (ref 0.61–1.24)
GFR calc Af Amer: 48 mL/min — ABNORMAL LOW (ref >60)
GFR calc non Af Amer: 41 mL/min — ABNORMAL LOW (ref >60)
Glucose, Bld: 114 mg/dL — ABNORMAL HIGH (ref 70–99)
Potassium: 4 mmol/L (ref 3.5–5.1)
Sodium: 135 mmol/L (ref 135–145)
Total Bilirubin: 0.6 mg/dL (ref 0.3–1.2)
Total Protein: 4.4 g/dL — ABNORMAL LOW (ref 6.5–8.1)

## 2019-01-03 LAB — URINALYSIS, ROUTINE W REFLEX MICROSCOPIC
Bacteria, UA: NONE SEEN
Bilirubin Urine: NEGATIVE
Glucose, UA: NEGATIVE mg/dL
Ketones, ur: NEGATIVE mg/dL
Leukocytes,Ua: NEGATIVE
Nitrite: NEGATIVE
Protein, ur: NEGATIVE mg/dL
Specific Gravity, Urine: 1.008 (ref 1.005–1.030)
pH: 5 (ref 5.0–8.0)

## 2019-01-03 LAB — GLUCOSE, CAPILLARY
Glucose-Capillary: 214 mg/dL — ABNORMAL HIGH (ref 70–99)
Glucose-Capillary: 153 mg/dL — ABNORMAL HIGH (ref 70–99)

## 2019-01-03 LAB — CBC WITH DIFFERENTIAL/PLATELET
Abs Immature Granulocytes: 0 10*3/uL (ref 0.00–0.07)
Basophils Absolute: 0 10*3/uL (ref 0.0–0.1)
Basophils Relative: 0 %
Eosinophils Absolute: 0 10*3/uL (ref 0.0–0.5)
Eosinophils Relative: 3 %
HCT: 22.9 % — ABNORMAL LOW (ref 39.0–52.0)
Hemoglobin: 7.3 g/dL — ABNORMAL LOW (ref 13.0–17.0)
Lymphocytes Relative: 32 %
Lymphs Abs: 0.3 10*3/uL — ABNORMAL LOW (ref 0.7–4.0)
MCH: 32.4 pg (ref 26.0–34.0)
MCHC: 31.9 g/dL (ref 30.0–36.0)
MCV: 101.8 fL — ABNORMAL HIGH (ref 80.0–100.0)
Monocytes Absolute: 0.1 10*3/uL (ref 0.1–1.0)
Monocytes Relative: 10 %
Neutro Abs: 0.6 10*3/uL — ABNORMAL LOW (ref 1.7–7.7)
Neutrophils Relative %: 55 %
Platelets: 123 10*3/uL — ABNORMAL LOW (ref 150–400)
RBC: 2.25 MIL/uL — ABNORMAL LOW (ref 4.22–5.81)
RDW: 15.3 % (ref 11.5–15.5)
WBC: 1 10*3/uL — CL (ref 4.0–10.5)
nRBC: 0 % (ref 0.0–0.2)

## 2019-01-03 LAB — LACTIC ACID, PLASMA: Lactic Acid, Venous: 0.8 mmol/L (ref 0.5–1.9)

## 2019-01-03 LAB — SARS CORONAVIRUS 2 BY RT PCR (HOSPITAL ORDER, PERFORMED IN ~~LOC~~ HOSPITAL LAB): SARS Coronavirus 2: NEGATIVE

## 2019-01-03 LAB — CBG MONITORING, ED
Glucose-Capillary: 96 mg/dL (ref 70–99)
Glucose-Capillary: 151 mg/dL — ABNORMAL HIGH (ref 70–99)

## 2019-01-03 LAB — PREPARE RBC (CROSSMATCH)

## 2019-01-03 MED ORDER — ONDANSETRON HCL 4 MG PO TABS: 4.0000 mg | ORAL_TABLET | Freq: Four times a day (QID) | ORAL | Status: DC | PRN

## 2019-01-03 MED ORDER — TRAZODONE HCL 50 MG PO TABS
25.0000 mg | ORAL_TABLET | Freq: Every evening | ORAL | Status: DC | PRN
  Administered 2019-01-04 – 2019-01-09 (×3): 25 mg via ORAL
  Filled 2019-01-03 (×3): qty 1

## 2019-01-03 MED ORDER — ASPIRIN EC 81 MG PO TBEC
81.0000 mg | DELAYED_RELEASE_TABLET | Freq: Every evening | ORAL | Status: DC
  Administered 2019-01-03 – 2019-01-09 (×7): 81 mg via ORAL
  Filled 2019-01-03 (×7): qty 1

## 2019-01-03 MED ORDER — SODIUM CHLORIDE 0.9 % IV SOLN
500.0000 mg | INTRAVENOUS | Status: DC
  Administered 2019-01-03 – 2019-01-05 (×3): 500 mg via INTRAVENOUS
  Filled 2019-01-03 (×3): qty 500

## 2019-01-03 MED ORDER — ACETAMINOPHEN 325 MG PO TABS
650.0000 mg | ORAL_TABLET | Freq: Once | ORAL | Status: AC
  Administered 2019-01-03: 650 mg via ORAL
  Filled 2019-01-03: qty 2

## 2019-01-03 MED ORDER — ENOXAPARIN SODIUM 40 MG/0.4ML ~~LOC~~ SOLN
40.0000 mg | SUBCUTANEOUS | Status: DC
  Administered 2019-01-03 – 2019-01-07 (×5): 40 mg via SUBCUTANEOUS
  Filled 2019-01-03 (×6): qty 0.4

## 2019-01-03 MED ORDER — CITALOPRAM HYDROBROMIDE 20 MG PO TABS
10.0000 mg | ORAL_TABLET | Freq: Every day | ORAL | Status: DC
  Administered 2019-01-03 – 2019-01-10 (×8): 10 mg via ORAL
  Filled 2019-01-03 (×8): qty 1

## 2019-01-03 MED ORDER — VANCOMYCIN HCL 10 G IV SOLR
1250.0000 mg | INTRAVENOUS | Status: DC
  Administered 2019-01-04 – 2019-01-05 (×2): 1250 mg via INTRAVENOUS
  Filled 2019-01-03 (×2): qty 1250

## 2019-01-03 MED ORDER — ACETAMINOPHEN 650 MG RE SUPP: 650.0000 mg | Freq: Four times a day (QID) | RECTAL | Status: DC | PRN

## 2019-01-03 MED ORDER — VANCOMYCIN HCL 10 G IV SOLR
2000.0000 mg | Freq: Once | INTRAVENOUS | Status: AC
  Administered 2019-01-03: 2000 mg via INTRAVENOUS
  Filled 2019-01-03: qty 2000

## 2019-01-03 MED ORDER — METOPROLOL SUCCINATE ER 50 MG PO TB24
75.0000 mg | ORAL_TABLET | Freq: Every day | ORAL | Status: DC
  Filled 2019-01-03: qty 1

## 2019-01-03 MED ORDER — GABAPENTIN 100 MG PO CAPS
100.0000 mg | ORAL_CAPSULE | Freq: Two times a day (BID) | ORAL | Status: DC
  Administered 2019-01-03 – 2019-01-10 (×15): 100 mg via ORAL
  Filled 2019-01-03 (×15): qty 1

## 2019-01-03 MED ORDER — ONDANSETRON HCL 4 MG/2ML IJ SOLN: 4.0000 mg | Freq: Four times a day (QID) | INTRAMUSCULAR | Status: DC | PRN

## 2019-01-03 MED ORDER — SODIUM CHLORIDE 0.9% IV SOLUTION: Freq: Once | INTRAVENOUS | Status: DC

## 2019-01-03 MED ORDER — ROSUVASTATIN CALCIUM 10 MG PO TABS
20.0000 mg | ORAL_TABLET | Freq: Every day | ORAL | Status: DC
  Administered 2019-01-03 – 2019-01-05 (×3): 20 mg via ORAL
  Filled 2019-01-03: qty 2
  Filled 2019-01-03: qty 1
  Filled 2019-01-03 (×2): qty 2

## 2019-01-03 MED ORDER — METHYLPHENIDATE HCL 5 MG PO TABS
5.0000 mg | ORAL_TABLET | Freq: Two times a day (BID) | ORAL | Status: DC
  Administered 2019-01-03 – 2019-01-10 (×14): 5 mg via ORAL
  Filled 2019-01-03 (×14): qty 1

## 2019-01-03 MED ORDER — LEVOTHYROXINE SODIUM 25 MCG PO TABS
137.0000 ug | ORAL_TABLET | Freq: Every day | ORAL | Status: DC
  Administered 2019-01-03 – 2019-01-10 (×8): 137 ug via ORAL
  Filled 2019-01-03 (×9): qty 1

## 2019-01-03 MED ORDER — TBO-FILGRASTIM 480 MCG/0.8ML ~~LOC~~ SOSY
480.0000 ug | PREFILLED_SYRINGE | Freq: Once | SUBCUTANEOUS | Status: DC
  Filled 2019-01-03: qty 0.8

## 2019-01-03 MED ORDER — SODIUM CHLORIDE 0.9 % IV SOLN
2.0000 g | Freq: Two times a day (BID) | INTRAVENOUS | Status: DC
  Administered 2019-01-03 – 2019-01-05 (×5): 2 g via INTRAVENOUS
  Filled 2019-01-03 (×5): qty 2

## 2019-01-03 MED ORDER — HYDROCORTISONE NA SUCCINATE PF 100 MG IJ SOLR
50.0000 mg | Freq: Four times a day (QID) | INTRAMUSCULAR | Status: AC
  Administered 2019-01-03 (×2): 50 mg via INTRAVENOUS
  Filled 2019-01-03 (×2): qty 2

## 2019-01-03 MED ORDER — ACETAMINOPHEN 325 MG PO TABS
650.0000 mg | ORAL_TABLET | Freq: Four times a day (QID) | ORAL | Status: DC | PRN
  Administered 2019-01-04 – 2019-01-08 (×5): 650 mg via ORAL
  Filled 2019-01-03 (×6): qty 2

## 2019-01-03 MED ORDER — INSULIN ASPART 100 UNIT/ML ~~LOC~~ SOLN
0.0000 [IU] | Freq: Three times a day (TID) | SUBCUTANEOUS | Status: DC
  Administered 2019-01-03: 3 [IU] via SUBCUTANEOUS
  Administered 2019-01-03: 2 [IU] via SUBCUTANEOUS
  Administered 2019-01-04: 1 [IU] via SUBCUTANEOUS
  Administered 2019-01-04: 2 [IU] via SUBCUTANEOUS
  Administered 2019-01-05: 1 [IU] via SUBCUTANEOUS
  Administered 2019-01-06: 18:00:00 3 [IU] via SUBCUTANEOUS
  Administered 2019-01-06: 12:00:00 5 [IU] via SUBCUTANEOUS
  Administered 2019-01-07: 7 [IU] via SUBCUTANEOUS
  Administered 2019-01-07: 1 [IU] via SUBCUTANEOUS
  Administered 2019-01-07: 13:00:00 3 [IU] via SUBCUTANEOUS
  Administered 2019-01-08 (×2): 1 [IU] via SUBCUTANEOUS
  Administered 2019-01-09: 2 [IU] via SUBCUTANEOUS
  Administered 2019-01-09: 1 [IU] via SUBCUTANEOUS
  Administered 2019-01-09: 5 [IU] via SUBCUTANEOUS
  Administered 2019-01-10: 2 [IU] via SUBCUTANEOUS
  Filled 2019-01-03: qty 0.09

## 2019-01-03 MED ORDER — PREDNISONE 5 MG PO TABS
5.0000 mg | ORAL_TABLET | Freq: Every day | ORAL | Status: DC
  Administered 2019-01-03 – 2019-01-06 (×4): 5 mg via ORAL
  Filled 2019-01-03 (×4): qty 1

## 2019-01-03 NOTE — ED Notes (Signed)
Patient placed on bedpan.

## 2019-01-03 NOTE — Progress Notes (Signed)
David [name].   DOB:11-Jan-1941   LS#:[id number]   OTL#:[id number]  Oncology follow up note   Subjective: Patient is well-known to me, has been under my care for his metastatic melanoma, which is in remission.  He was recently hospitalized for neutropenic fever from leg cellulitis and UTI.  He presented to emergency room yesterday with 2 episodes of fever at home.  Temperature was 101, with chills.  He denies any productive cough, dyspnea or chest pain.  No other new symptoms.   Objective:  Vitals:   01/03/19 1530 01/03/19 1631  BP: 107/66 102/74  Pulse: 75 81  Resp: 13 20  Temp:  97.7 F (36.5 C)  SpO2: 96% 96%    Body mass index is 29.11 kg/m.  Intake/Output Summary (Last 24 hours) at 01/03/2019 1731 Last data filed at 01/03/2019 1600 Gross per 24 hour  Intake 4936.7 ml  Output -  Net 4936.7 ml     Sclerae unicteric  Oropharynx clear  No peripheral adenopathy  Lungs clear -- no rales or rhonchi  Heart regular rate and rhythm  Abdomen benign  MSK no focal spinal tenderness, no peripheral edema  Neuro nonfocal    CBG (last 3)  Recent Labs    01/03/19 1021 01/03/19 1225 01/03/19 1639  GLUCAP 96 151* 214*     Labs:  Lab Results  Component Value Date   WBC 1.0 (LL) 01/03/2019   HGB 7.3 (L) 01/03/2019   HCT 22.9 (L) 01/03/2019   MCV 101.8 (H) 01/03/2019   PLT 123 (L) 01/03/2019   NEUTROABS 0.6 (L) 01/03/2019    Urine Studies No results for input(s): UHGB, CRYS in the last 72 hours.  Invalid input(s): UACOL, UAPR, USPG, UPH, UTP, UGL, UKET, UBIL, UNIT, UROB, ULEU, UEPI, UWBC, Duwayne Heck [place], [place]  Basic Metabolic Panel: Recent Labs  Lab 01/02/19 0955 01/02/19 2219 01/03/19 0559  NA 135 133* 135  K 4.3 4.6 4.0  CL 103 104 110  CO2 23 19* 16*  GLUCOSE 86 81 114*  BUN 66* 78* 68*  CREATININE 1.48 1.66* 1.58*  CALCIUM 8.6 8.1* 7.5*   GFR Estimated Creatinine Clearance: 42.6 mL/min (A) (by C-G formula based on SCr of 1.58 mg/dL  (H)). Liver Function Tests: Recent Labs  Lab 01/02/19 0955 01/02/19 2219 01/03/19 0559  AST _0 ALT _1 ALKPHOS 45 38 35*  BILITOT 0.5 0.9 0.6  PROT 5.6* 5.2* 4.4*  ALBUMIN 3.5 2.8* 2.4*   No results for input(s): LIPASE, AMYLASE in the last 168 hours. No results for input(s): AMMONIA in the last 168 hours. Coagulation profile Recent Labs  Lab 01/02/19 2219  INR 1.2    CBC: Recent Labs  Lab 01/02/19 0955 01/02/19 2219 01/03/19 0559  WBC 0.7 Repeated and verified X2.* 0.8* 1.0*  NEUTROABS 0.3* 0.3* 0.6*  HGB 9.1* 8.1* 7.3*  HCT 26.4* 24.0* 22.9*  MCV 96.6 99.6 101.8*  PLT 210.0 155 123*   Cardiac Enzymes: No results for input(s): CKTOTAL, CKMB, CKMBINDEX, TROPONINI in the last 168 hours. BNP: Invalid input(s): POCBNP CBG: Recent Labs  Lab 12/27/18 2123 12/28/18 0702 01/03/19 1021 01/03/19 1225 01/03/19 1639  GLUCAP 281* 81 96 151* 214*   D-Dimer No results for input(s): DDIMER in the last 72 hours. Hgb A1c No results for input(s): HGBA1C in the last 72 hours. Lipid Profile No results for input(s): CHOL, HDL, LDLCALC, TRIG, CHOLHDL, LDLDIRECT in the last 72 hours. Thyroid function studies Recent Labs  01/02/19 0955  TSH 2.54   Anemia work up Recent Labs    01/02/19 0955  FERRITIN 700.4*  IRON 119   Microbiology Recent Results (from the past 240 hour(s))  SARS Coronavirus 2 [facility] order, Performed in [facility] hospital lab) Nasopharyngeal Nasopharyngeal Swab     Status: None   Collection Time: 01/03/19 12:46 AM   Specimen: Nasopharyngeal Swab  Result Value Ref Range Status   SARS Coronavirus 2 NEGATIVE NEGATIVE Final    Comment: (NOTE) If result is NEGATIVE SARS-CoV-2 target nucleic acids are NOT DETECTED. The SARS-CoV-2 RNA is generally detectable in upper and lower  respiratory specimens during the acute phase of infection. The lowest  concentration of SARS-CoV-2 viral copies this assay can detect is 250  copies / mL.  A negative result does not preclude SARS-CoV-2 infection  and should not be used as the sole basis for treatment or other  patient management decisions.  A negative result may occur with  improper specimen collection / handling, submission of specimen other  than nasopharyngeal swab, presence of viral mutation(s) within the  areas targeted by this assay, and inadequate number of viral copies  (<250 copies / mL). A negative result must be combined with clinical  observations, patient history, and epidemiological information. If result is POSITIVE SARS-CoV-2 target nucleic acids are DETECTED. The SARS-CoV-2 RNA is generally detectable in upper and lower  respiratory specimens dur ing the acute phase of infection.  Positive  results are indicative of active infection with SARS-CoV-2.  Clinical  correlation with patient history and other diagnostic information is  necessary to determine patient infection status.  Positive results do  not rule out bacterial infection or co-infection with other viruses. If result is PRESUMPTIVE POSTIVE SARS-CoV-2 nucleic acids MAY BE PRESENT.   A presumptive positive result was obtained on the submitted specimen  and confirmed on repeat testing.  While 2019 novel coronavirus  (SARS-CoV-2) nucleic acids may be present in the submitted sample  additional confirmatory testing may be necessary for epidemiological  and / or clinical management purposes  to differentiate between  SARS-CoV-2 and other Sarbecovirus currently known to infect humans.  If clinically indicated additional testing with an alternate test  methodology [phone]) is advised. The SARS-CoV-2 RNA is generally  detectable in upper and lower respiratory sp ecimens during the acute  phase of infection. The expected result is Negative. Fact Sheet for Patients:  StrictlyIdeas.no Fact Sheet for Healthcare Providers: BankingDealers.co.za This test is not  yet approved or cleared by the [place] FDA and has been authorized for detection and/or diagnosis of SARS-CoV-2 by FDA under an Emergency Use Authorization (EUA).  This EUA will remain in effect (meaning this test can be used) for the duration of the COVID-19 declaration under Section 564(b)(1) of the Act, 21 U.S.C. section 360bbb-3(b)(1), unless the authorization is terminated or revoked sooner. Performed at [facility], [place] [address], [place], [place] [address]       Studies:  Dg Chest Port 1 View  Result Date: 01/02/2019 CLINICAL DATA:  Fever. EXAM: PORTABLE CHEST 1 VIEW COMPARISON:  None. FINDINGS: Low lung volumes limit assessment. Post median sternotomy and CABG. Left-sided pacemaker in place. Unchanged cardiomegaly. Bronchovascular crowding versus vascular congestion. Stable right hilar prominence from prior exam. Patchy infrahilar opacities, left greater than right, nonspecific cough low volume chest. No large pleural effusion. No pneumothorax. IMPRESSION: 1. Low lung volumes with bronchovascular crowding versus vascular congestion. 2. Patchy infrahilar opacities, left greater than right.  Limited assessment on low volume exam, favor atelectasis however pneumonia not excluded in the setting of fever. Recommend PA and lateral views on patient is able. 3. Post CABG with mild cardiomegaly, likely accentuated by technique. Electronically Signed   By: Keith Rake M.D.   On: 01/02/2019 22:39    Assessment: 78 y.o. 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  He recently developed pancytopenia, he was hospitalized for neutropenic fever and cellulitis in early September.  With neutropenic fever again.  1.  Neutropenic fever 2. Probable pneumonia  3. Worsening anemia  4.  Newly diagnosed prostate cancer, has not been treated 5.  History of metastatic melanoma to brian, NED now   6.  History of hemorrhagic stroke, from brain metastasis, cognitive dysfunction   Plan:  -lab reviewed, given his neutropenia fever, I will give 1 dose of Granix today, he previously responded very well, it only required 1 dose 2 weeks ago. -Please consider blood transfusion if hemoglobin <7.5 -Bone marrow biopsy has been discussed with patient and his daughter previously, both agrees, it is currently scheduled for October 5th.  -I will f/u on Monday if he is still in hospital, otherwise will see him back in my clinic    [name], MD 01/03/2019  5:31 PM

## 2019-01-03 NOTE — H&P (Signed)
History and Physical    David [name]. MBE:[id number] DOB: 29-Jul-1940 DOA: 01/02/2019  PCP: [name], MD  Patient coming from: Home.  Chief Complaint: Fever.  HPI: David [name]. is a 78 y.o. male with history of metastatic malignant melanoma on immunotherapy, history of prostate cancer, diabetes mellitus type 2, hypothyroidism, history of complete heart block status post pacemaker placement, CAD status post CABG and history of ischemic cardiomyopathy status post ICD started experiencing fever and chills at home with temperature 101 F recorded at home and was concerned and was brought to the ER.  Denies any chest pain shortness of breath nausea vomiting or diarrhea.  ED Course: In the ER patient was afebrile but labs show neutropenia.  With CBC showing WBC count of 0.8 with neutrophil 38%.  Lactic acid was 0.9 creatinine was 1.6.  COVID-19 test was negative.  Chest x-ray shows possibility of pneumonia.  Blood cultures were obtained and started on empiric antibiotics.  Patient admitted for further work-up.  Patient's blood pressure is running in the low 90s.  Review of Systems: As per HPI, rest all negative.   Past Medical History:  Diagnosis Date  . Anginal pain ([place])   . Arthritis    mostly hands  . Benign familial hematuria   . BPH (benign prostatic hypertrophy)   . Brain cancer ([place])    melanoma with brain met  . Coronary artery disease   . GERD (gastroesophageal reflux disease)   . High cholesterol   . Hypertension   . Hypothyroidism   . Intracerebral hemorrhage ([place])   . Melanoma ([place])   . Metastatic melanoma to lung ([place])   . Mood disorder ([place])   . Myocardial infarction ([place])   . Pacemaker    due to syncope, 3rd degree HB (upgrade to [facility]. Jude CRT-P 02/25/13 (Dr. Uvaldo Rising)  . Rectal bleed    due to NSAIDS  . Renal disorder   . Sepsis ([place]) 12/09/2018  . Skin cancer    melanoma  . Sleep apnea    uses CPAP  . Stroke [facility])     Past Surgical  History:  Procedure Laterality Date  . APPLICATION OF CRANIAL NAVIGATION N/A 10/15/2015   Procedure: APPLICATION OF CRANIAL NAVIGATION;  Surgeon: Erline Levine, MD;  Location: [place] NEURO ORS;  Service: Neurosurgery;  Laterality: N/A;  . CARDIAC CATHETERIZATION  2013  . CARDIAC SURGERY     bypass X 2  . COLONOSCOPY WITH PROPOFOL N/A 05/08/2016   diverticulosis, int hem, no f/u needed [name])  . CORONARY ARTERY BYPASS GRAFT  2013   LIMA-LAD, SVG-PDA 03/27/11 (Dr. Francee Gentile)  . CRANIOTOMY N/A 10/15/2015   Procedure: LEFT FRONTAL CRANIOTOMY TUMOR EXCISION with Curve;  Surgeon: Erline Levine, MD;  Location: [place] NEURO ORS;  Service: Neurosurgery;  Laterality: N/A;  CRANIOTOMY TUMOR EXCISION  . EYE SURGERY    . FOOT SURGERY  08/2010  . JOINT REPLACEMENT Left    partial knee  . KNEE ARTHROSCOPY  12/2008  . LYMPH NODE BIOPSY    . PACEMAKER INSERTION    . TONSILLECTOMY       reports that he quit smoking about 61 years ago. He has a 3.00 pack-year smoking history. He has never used smokeless tobacco. He reports current alcohol use of about 14.0 standard drinks of alcohol per week. He reports that he does not use drugs.  Allergies  Allergen Reactions  . Ezetimibe Other (See Comments)    Unknown reaction  . Simvastatin Other (See Comments)  Reaction:  Gave pt a fever  Fever - temp of 103.   In 2003    Family History  Problem Relation Age of Onset  . Cancer Mother 63       lung   . Hypertension Mother   . Hypertension Father   . Heart attack Father   . Heart disease Father   . Rheum arthritis Sister   . Cancer Maternal Grandmother     Prior to Admission medications   Medication Sig Start Date End Date Taking? Authorizing Provider  acetaminophen (TYLENOL) 325 MG tablet Take 2 tablets (650 mg total) by mouth every 6 (six) hours as needed for mild pain (or Fever >/= 101). 12/27/18  Yes Angiulli, Lavon Paganini, PA-C  amLODipine (NORVASC) 5 MG tablet Take 1 tablet (5 mg total) by mouth daily. 12/27/18   Yes Angiulli, Lavon Paganini, PA-C  aspirin EC 81 MG tablet Take 81 mg by mouth every evening.    Yes [provider]  Cholecalciferol (VITAMIN D3) 25 MCG (1000 UT) CAPS Take 1 capsule (1,000 Units total) by mouth daily. 12/27/18  Yes Angiulli, Lavon Paganini, PA-C  citalopram (CELEXA) 10 MG tablet Take 1 tablet (10 mg total) by mouth daily. 12/27/18  Yes Angiulli, Lavon Paganini, PA-C  gabapentin (NEURONTIN) 100 MG capsule Take 1 capsule (100 mg total) by mouth 2 (two) times daily. 12/27/18  Yes Angiulli, Lavon Paganini, PA-C  glipiZIDE (GLUCOTROL) 5 MG tablet Take 0.5 tablets (2.5 mg total) by mouth 2 (two) times daily before a meal. 12/27/18 01/26/19 Yes Angiulli, Lavon Paganini, PA-C  levothyroxine (SYNTHROID) 137 MCG tablet TAKE 1 TABLET BY MOUTH ONCE DAILY BEFORE BREAKFAST Patient taking differently: Take 137 mcg by mouth daily before breakfast.  12/27/18  Yes Angiulli, Lavon Paganini, PA-C  methylphenidate (RITALIN) 5 MG tablet Take 1 tablet (5 mg total) by mouth 2 (two) times daily with breakfast and lunch. 12/27/18  Yes Angiulli, Lavon Paganini, PA-C  metoprolol succinate (TOPROL-XL) 50 MG 24 hr tablet TAKE 1 AND 1/2 TABLET (75 MG) BY MOUTH EVERY DAY WITH OR IMMEDIATELY FOLLOWING A MEAL *DO NOT CRUSH* Patient taking differently: Take 75 mg by mouth daily.  12/27/18  Yes Angiulli, Lavon Paganini, PA-C  polyethylene glycol (MIRALAX / GLYCOLAX) 17 g packet Take 17 g by mouth 2 (two) times daily. Patient taking differently: Take 17 g by mouth daily.  12/27/18  Yes Angiulli, Lavon Paganini, PA-C  predniSONE (DELTASONE) 10 MG tablet 4 tabs daily x1 day then 2 tabs daily x3 days then 1 tab daily x3 days and stop 12/27/18  Yes Angiulli, Lavon Paganini, PA-C  rosuvastatin (CRESTOR) 20 MG tablet Take 1 tablet (20 mg total) by mouth at bedtime. 12/27/18  Yes Angiulli, Lavon Paganini, PA-C  traZODone (DESYREL) 50 MG tablet Take 0.5-1 tablets (25-50 mg total) by mouth at bedtime. Patient taking differently: Take 25 mg by mouth at bedtime as needed for sleep.  10/14/18   Yes [name], MD  Turmeric 500 MG CAPS Take 1,000 mg by mouth 2 (two) times daily.    Yes [provider]  vitamin E (VITAMIN E) 400 UNIT capsule Take 1 capsule (400 Units total) by mouth 2 (two) times daily. Patient taking differently: Take 800 Units by mouth every evening.  07/24/16  Yes Hayden Pedro, PA-C  blood glucose meter kit and supplies KIT Dispense based on patient and insurance preference. Use up to four times daily as directed. (FOR ICD-9 250.00, 250.01). 12/18/18   Little Ishikawa, MD  blood glucose meter kit and supplies Dispense based on patient and insurance preference. Use up to four times daily as directed. (FOR ICD-10 E10.9, E11.9). 12/26/18   Angiulli, Lavon Paganini, PA-C  blood glucose meter kit and supplies Dispense based on patient and insurance preference. Use up to four times daily as directed. (FOR ICD-10 E10.9, E11.9). 12/26/18   Angiulli, Lavon Paganini, PA-C  predniSONE (DELTASONE) 5 MG tablet Take 1 tablet (5 mg total) by mouth daily with breakfast. For gout flare over weekend if needed 12/31/18   [name], MD  senna (SENOKOT) 8.6 MG TABS tablet Take 1 tablet (8.6 mg total) by mouth at bedtime. Patient not taking: Reported on 01/02/2019 12/27/18   Angiulli, Lavon Paganini, PA-C    Physical Exam: Constitutional: Moderately built and nourished. Vitals:   01/03/19 0419 01/03/19 0430 01/03/19 0437 01/03/19 0500  BP: (!) 98/52 (!) 93/51  (!) 95/51  Pulse: 85 81  81  Resp: (!) 26 (!) 22  (!) 23  Temp:      TempSrc:      SpO2: 94% 92%  93%  Weight:   90.7 kg   Height:   5' 9.5" (1.765 m)    Eyes: Anicteric no pallor. ENMT: No discharge from the ears eyes nose or mouth. Neck: No mass felt.  No neck rigidity. Respiratory: No rhonchi or crepitations. Cardiovascular: S1-S2 heard. Abdomen: Soft nontender bowel sounds present. Musculoskeletal: No edema. Skin: No rash. Neurologic: Alert awake oriented to time place and person.  Moves all extremities.  Psychiatric: Appears normal per normal affect.   Labs on Admission: I have personally reviewed following labs and imaging studies  CBC: Recent Labs  Lab 01/02/19 0955 01/02/19 2219  WBC 0.7 Repeated and verified X2.* 0.8*  NEUTROABS 0.3* 0.3*  HGB 9.1* 8.1*  HCT 26.4* 24.0*  MCV 96.6 99.6  PLT 210.0 482   Basic Metabolic Panel: Recent Labs  Lab 01/02/19 0955 01/02/19 2219  NA 135 133*  K 4.3 4.6  CL 103 104  CO2 23 19*  GLUCOSE 86 81  BUN 66* 78*  CREATININE 1.48 1.66*  CALCIUM 8.6 8.1*   GFR: Estimated Creatinine Clearance: 40.5 mL/min (A) (by C-G formula based on SCr of 1.66 mg/dL (H)). Liver Function Tests: Recent Labs  Lab 01/02/19 0955 01/02/19 2219  AST 16 22  ALT 22 25  ALKPHOS 45 38  BILITOT 0.5 0.9  PROT 5.6* 5.2*  ALBUMIN 3.5 2.8*   No results for input(s): LIPASE, AMYLASE in the last 168 hours. No results for input(s): AMMONIA in the last 168 hours. Coagulation Profile: Recent Labs  Lab 01/02/19 2219  INR 1.2   Cardiac Enzymes: No results for input(s): CKTOTAL, CKMB, CKMBINDEX, TROPONINI in the last 168 hours. BNP (last 3 results) No results for input(s): PROBNP in the last 8760 hours. HbA1C: No results for input(s): HGBA1C in the last 72 hours. CBG: Recent Labs  Lab 12/27/18 0602 12/27/18 1201 12/27/18 1637 12/27/18 2123 12/28/18 0702  GLUCAP 114* 146* 307* 281* 81   Lipid Profile: No results for input(s): CHOL, HDL, LDLCALC, TRIG, CHOLHDL, LDLDIRECT in the last 72 hours. Thyroid Function Tests: Recent Labs    01/02/19 0955  TSH 2.54  FREET4 1.01   Anemia Panel: Recent Labs    01/02/19 0955  FERRITIN 700.4*  IRON 119   Urine analysis:    Component Value Date/Time   COLORURINE STRAW (A) 01/03/2019 0046   APPEARANCEUR CLEAR 01/03/2019 0046   LABSPEC 1.008 01/03/2019 0046  PHURINE 5.0 01/03/2019 0046   GLUCOSEU NEGATIVE 01/03/2019 0046   HGBUR MODERATE (A) 01/03/2019 0046   BILIRUBINUR NEGATIVE 01/03/2019 0046    KETONESUR NEGATIVE 01/03/2019 0046   PROTEINUR NEGATIVE 01/03/2019 0046   NITRITE NEGATIVE 01/03/2019 0046   LEUKOCYTESUR NEGATIVE 01/03/2019 0046   Sepsis Labs: @LABRCNTIP (procalcitonin:4,lacticidven:4) ) Recent Results (from the past 240 hour(s))  SARS Coronavirus 2 [facility] order, Performed in [facility] hospital lab) Nasopharyngeal Nasopharyngeal Swab     Status: None   Collection Time: 01/03/19 12:46 AM   Specimen: Nasopharyngeal Swab  Result Value Ref Range Status   SARS Coronavirus 2 NEGATIVE NEGATIVE Final    Comment: (NOTE) If result is NEGATIVE SARS-CoV-2 target nucleic acids are NOT DETECTED. The SARS-CoV-2 RNA is generally detectable in upper and lower  respiratory specimens during the acute phase of infection. The lowest  concentration of SARS-CoV-2 viral copies this assay can detect is 250  copies / mL. A negative result does not preclude SARS-CoV-2 infection  and should not be used as the sole basis for treatment or other  patient management decisions.  A negative result may occur with  improper specimen collection / handling, submission of specimen other  than nasopharyngeal swab, presence of viral mutation(s) within the  areas targeted by this assay, and inadequate number of viral copies  (<250 copies / mL). A negative result must be combined with clinical  observations, patient history, and epidemiological information. If result is POSITIVE SARS-CoV-2 target nucleic acids are DETECTED. The SARS-CoV-2 RNA is generally detectable in upper and lower  respiratory specimens dur ing the acute phase of infection.  Positive  results are indicative of active infection with SARS-CoV-2.  Clinical  correlation with patient history and other diagnostic information is  necessary to determine patient infection status.  Positive results do  not rule out bacterial infection or co-infection with other viruses. If result is PRESUMPTIVE POSTIVE SARS-CoV-2 nucleic acids MAY BE  PRESENT.   A presumptive positive result was obtained on the submitted specimen  and confirmed on repeat testing.  While 2019 novel coronavirus  (SARS-CoV-2) nucleic acids may be present in the submitted sample  additional confirmatory testing may be necessary for epidemiological  and / or clinical management purposes  to differentiate between  SARS-CoV-2 and other Sarbecovirus currently known to infect humans.  If clinically indicated additional testing with an alternate test  methodology [phone]) is advised. The SARS-CoV-2 RNA is generally  detectable in upper and lower respiratory sp ecimens during the acute  phase of infection. The expected result is Negative. Fact Sheet for Patients:  StrictlyIdeas.no Fact Sheet for Healthcare Providers: BankingDealers.co.za This test is not yet approved or cleared by the [place] FDA and has been authorized for detection and/or diagnosis of SARS-CoV-2 by FDA under an Emergency Use Authorization (EUA).  This EUA will remain in effect (meaning this test can be used) for the duration of the COVID-19 declaration under Section 564(b)(1) of the Act, 21 U.S.C. section 360bbb-3(b)(1), unless the authorization is terminated or revoked sooner. Performed at [facility], [place] [address], [place], [place] [address]      Radiological Exams on Admission: Dg Chest Port 1 View  Result Date: 01/02/2019 CLINICAL DATA:  Fever. EXAM: PORTABLE CHEST 1 VIEW COMPARISON:  None. FINDINGS: Low lung volumes limit assessment. Post median sternotomy and CABG. Left-sided pacemaker in place. Unchanged cardiomegaly. Bronchovascular crowding versus vascular congestion. Stable right hilar prominence from prior exam. Patchy infrahilar opacities, left greater than right, nonspecific cough  low volume chest. No large pleural effusion. No pneumothorax. IMPRESSION: 1. Low lung volumes with bronchovascular crowding  versus vascular congestion. 2. Patchy infrahilar opacities, left greater than right. Limited assessment on low volume exam, favor atelectasis however pneumonia not excluded in the setting of fever. Recommend PA and lateral views on patient is able. 3. Post CABG with mild cardiomegaly, likely accentuated by technique. Electronically Signed   By: Keith Rake M.D.   On: 01/02/2019 22:39    EKG: Independently reviewed.  Normal sinus rhythm with RBBB.  Assessment/Plan Principal Problem:   Neutropenic fever (HCC) Active Problems:   ICH (intracerebral hemorrhage) (HCC) - L frontal due to HTN vs CAA   OSA on CPAP   Biventricular ICD (implantable cardioverter-defibrillator) in place   Chronic kidney disease, stage III (moderate) (HCC)   Metastatic melanoma to lung [facility])   Acquired hypothyroidism   Ischemic cardiomyopathy   Hx of CABG   Heart block AV complete ([place])   Controlled type 2 diabetes mellitus with hyperglycemia, without long-term current use of insulin ([place])    1. Febrile neutropenia -given that possibility of pneumonia patient is on empiric antibiotics with cefepime and Zithromax.  Follow cultures.  Will also add vancomycin since patient's blood pressure is in the low normal.  Consult patient's oncologist Dr. Burr Medico in the morning.  Since patient is on prednisone will give stress dose steroids. 2. History of hypertension we will hold antihypertensives due to low normal blood pressure.  We will give stress dose steroids. 3. History of CAD status post CABG denies any chest pain.  On statins.  Will hold metoprolol due to low normal blood pressure. 4. Chronic steroids we will keep patient on prednisone but also give stress dose steroids. 5. Hypothyroidism on Synthroid. 6. Diabetes mellitus type 2 we will keep patient on sliding scale coverage. 7. History of ischemic cardiomyopathy status post ICD presently appears compensated. 8. Metastatic malignant melanoma being followed by Dr. Burr Medico.   Please consult Dr. Burr Medico in the morning. 9. History of prostate cancer. 10. Chronic kidney disease stage III creatinine appears to be at baseline. 11. History of complete heart block status post pacemaker placement.   DVT prophylaxis: Lovenox. Code Status: Full code. Family Communication: Need to discuss with patient's family. Disposition Plan: Home. Consults called: None. Admission status: Inpatient.   Rise Patience MD [facility] Pager [phone].  If 7PM-7AM, please contact night-coverage [email or web address] Password [email or web address]  01/03/2019, 5:22 AM

## 2019-01-03 NOTE — Progress Notes (Addendum)
Pharmacy Antibiotic Note  David Ferg. is a 78 y.o. male admitted on 01/02/2019 with Febrile neutropenia.  Pharmacy has been consulted for Cefepime and vancomycin dosing.  Plan: Cefepime 2gm iv q12hr Vancomycin 2gm iv x1, then Vancomycin 1250 mg IV Q 24 hrs. Goal AUC 400-550. Expected AUC: 518 SCr used: 1.58   Height: 5' 9.5" (176.5 cm) Weight: 199 lb 15.3 oz (90.7 kg) IBW/kg (Calculated) : 71.85  Temp (24hrs), Avg:98.9 F (37.2 C), Min:98.1 F (36.7 C), Max:99.6 F (37.6 C)  Recent Labs  Lab 01/02/19 0955 01/02/19 2219 01/03/19 0046  WBC 0.7 Repeated and verified X2.* 0.8*  --   CREATININE 1.48 1.66*  --   LATICACIDVEN  --  0.9 0.8    Estimated Creatinine Clearance: 40.5 mL/min (A) (by C-G formula based on SCr of 1.66 mg/dL (H)).    Allergies  Allergen Reactions  . Ezetimibe Other (See Comments)    Unknown reaction  . Simvastatin Other (See Comments)    Reaction:  Gave pt a fever  Fever - temp of 103.   In 2003    Antimicrobials this admission: Cefepime 01/03/2019 >>  Vancomycin 01/03/2019 >>   Dose adjustments this admission: -  Microbiology results: -  Thank you for allowing pharmacy to be a part of this patient's care.  David [name] 01/03/2019 5:31 AM

## 2019-01-03 NOTE — ED Notes (Signed)
Labs were drawn by Main Phlebotomy.

## 2019-01-03 NOTE — Progress Notes (Signed)
Patient's care started prior to midnight in the ED and patient was admitted early this morning after midnight and I am in current agreement with assessment and plan done by Dr. Gean Birchwood.  Additional changes to the plan of care been made accordingly.  David [name], David [name]. is a 78 year old Caucasian male with a past medical history significant for but not limited to history of metastatic malignant melanoma on immunotherapy, history of prostate cancer, diabetes mellitus type 2, hypothyroidism, history of complete heart block status post pacemaker placement, CAD status post CABG, history of ischemic cardiomyopathy status post ICD and other comorbidities who presented with fevers and chills at home with a temperature of 101.  Family was concerned and brought him to the ER he was found to be neutropenic.  Chest x-ray showed a likely pneumonia and he is admitted for further work-up.  Patient's blood pressure remains on the lower side.  In the ED he was given 3 L of normal saline boluses and started on maintenance IV fluid hydration, stress dose steroids, as well as being placed on IV cefepime and IV azithromycin.  IV vancomycin was also added given his hypotension and because of his febrile neutropenia.  Medical oncology Dr. Morey Hummingbird was consulted for further evaluation.  Currently patient is being admitted for him being treated for the following:  Febrile Neutropenia  -given that possibility of pneumonia patient is on empiric antibiotics with cefepime and Zithromax.  Follow cultures.  Will also add vancomycin since patient's blood pressure is in the low normal.  Consult patient's oncologist Dr. Burr Medico in the morning.  Since patient is on prednisone will give stress dose steroids.  History of hypertension  -We will hold antihypertensives due to low normal blood pressure.   -We will give stress dose steroids.  History of CAD status post CABG  -Denies any chest pain.  On statins.  Will hold metoprolol due to low  normal blood pressure.  Chronic Steroids  -we will keep patient on prednisone but also give stress dose steroids.  Hypothyroidism  -Check TSH -On Synthroid.  Diabetes Mellitus Type 2  -We will keep patient on sliding scale coverage.  History of ischemic cardiomyopathy status post ICD  -presently appears compensated.  Metastatic malignant melanoma  -being followed by Dr. Burr Medico and have consulted her  History of prostate cancer.  Chronic kidney disease stage III  -creatinine appears to be close to baseline and BUN/creatinine is now 66/1.58 -Avoid nephrotoxic medications, contrast dyes as well as hypotension if possible -Repeat CMP in a.m.  History of complete heart block  -status post pacemaker placement. -c/w Telemetry  Pancytopenia -WBC is 1000, hemoglobin/hematocrit was 7.3/22.9, and count was 23,000 Plan discussed with Dr. Eddie Dibbles and she may consider giving the patient Granix -We will type and screen transfuse 1 unit of PRBCs -Repeat CBC in a.m.  We will continue monitor patient clinical response to intervention and repeat blood work in the a.m.

## 2019-01-03 NOTE — ED Notes (Signed)
ED TO INPATIENT HANDOFF REPORT  Name/Age/Gender David [name]. 78 y.o. male  Code Status    Code Status Orders  (From admission, onward)         Start     Ordered   01/03/19 0521  Full code  Continuous     01/03/19 0521        Code Status History    Date Active Date Inactive Code Status Order ID Comments User Context   12/18/2018 1704 12/28/2018 1358 Full Code [id number]  Cathlyn Parsons, PA-C Inpatient   12/18/2018 1704 12/18/2018 1704 Full Code [id number]  Cathlyn Parsons, PA-C Inpatient   12/16/2018 1731 12/18/2018 1652 Full Code [id number]  Mckinley Jewel, MD ED   12/09/2018 0217 12/12/2018 1949 Full Code [id number]  Jani Gravel, MD ED   11/01/2016 1845 11/04/2016 1705 Full Code [id number]  Gennaro Africa, MD Inpatient   05/06/2016 1412 05/08/2016 1754 Full Code [id number]  Theodoro Grist, MD Inpatient   10/15/2015 1319 10/21/2015 1534 Full Code [id number]  Erline Levine, MD Inpatient   08/26/2015 1651 09/10/2015 1819 Full Code [id number]  Flora Lipps Inpatient   08/23/2015 0035 08/26/2015 1651 Full Code [id number]  Stewart, Guy Planning Activity    Advance Directive Documentation     Most Recent Value  Type of Advance Directive  Healthcare Power of Attorney, Living will  Pre-existing out of facility DNR order (yellow form or pink MOST form)  -  "MOST" Form in Place?  -      Home/SNF/Other Home  Chief Complaint fever  Level of Care/Admitting Diagnosis ED Disposition    ED Disposition Condition [place]: [place] [[id number]]  Level of Care: Telemetry [5]  Admit to tele based on following criteria: Monitor for Ischemic changes  Covid Evaluation: N/A  Diagnosis: Neutropenic fever [facility]) [[id number]]  Admitting Physician: Rise Patience [phone]  Attending Physician: Rise Patience [phone]  Estimated length of stay: past midnight tomorrow  Certification:: I certify this patient will need inpatient  services for at least 2 midnights  PT Class (Do Not Modify): Inpatient [101]  PT Acc Code (Do Not Modify): Private [1]       Medical History Past Medical History:  Diagnosis Date  . Anginal pain ([place])   . Arthritis    mostly hands  . Benign familial hematuria   . BPH (benign prostatic hypertrophy)   . Brain cancer ([place])    melanoma with brain met  . Coronary artery disease   . GERD (gastroesophageal reflux disease)   . High cholesterol   . Hypertension   . Hypothyroidism   . Intracerebral hemorrhage ([place])   . Melanoma ([place])   . Metastatic melanoma to lung ([place])   . Mood disorder ([place])   . Myocardial infarction ([place])   . Pacemaker    due to syncope, 3rd degree HB (upgrade to [facility]. Jude CRT-P 02/25/13 (Dr. Uvaldo Rising)  . Rectal bleed    due to NSAIDS  . Renal disorder   . Sepsis ([place]) 12/09/2018  . Skin cancer    melanoma  . Sleep apnea    uses CPAP  . Stroke [facility])     Allergies Allergies  Allergen Reactions  . Ezetimibe Other (See Comments)    Unknown reaction  . Simvastatin Other (See Comments)    Reaction:  Gave pt a fever  Fever - temp of 103.   In 2003  IV Location/Drains/Wounds Patient Lines/Drains/Airways Status   Active Line/Drains/Airways    Name:   Placement date:   Placement time:   Site:   Days:   Peripheral IV 01/02/19 Left Antecubital   01/02/19    2244    Antecubital   1   Peripheral IV 01/02/19 Right Antecubital   01/02/19    2250    Antecubital   1   Incision (Closed) 10/15/15 Head Left   10/15/15    1036     1176          Labs/Imaging Results for orders placed or performed during the hospital encounter of 01/02/19 (from the past 48 hour(s))  Lactic acid, plasma     Status: None   Collection Time: 01/02/19 10:19 PM  Result Value Ref Range   Lactic Acid, Venous 0.9 0.5 - 1.9 mmol/L    Comment: Performed at [facility], [place] [address], [place], [place] [address]  Comprehensive metabolic panel     Status: Abnormal    Collection Time: 01/02/19 10:19 PM  Result Value Ref Range   Sodium 133 (L) 135 - 145 mmol/L   Potassium 4.6 3.5 - 5.1 mmol/L   Chloride 104 98 - 111 mmol/L   CO2 19 (L) 22 - 32 mmol/L   Glucose, Bld 81 70 - 99 mg/dL   BUN 78 (H) 8 - 23 mg/dL   Creatinine, Ser 1.66 (H) 0.61 - 1.24 mg/dL   Calcium 8.1 (L) 8.9 - 10.3 mg/dL   Total Protein 5.2 (L) 6.5 - 8.1 g/dL   Albumin 2.8 (L) 3.5 - 5.0 g/dL   AST 22 15 - 41 U/L   ALT 25 0 - 44 U/L   Alkaline Phosphatase 38 38 - 126 U/L   Total Bilirubin 0.9 0.3 - 1.2 mg/dL   GFR calc non Af Amer 39 (L) >60 mL/min   GFR calc Af Amer 45 (L) >60 mL/min   Anion gap 10 5 - 15    Comment: Performed at [facility], [place] [address], [place], [place] [address]  CBC WITH DIFFERENTIAL     Status: Abnormal   Collection Time: 01/02/19 10:19 PM  Result Value Ref Range   WBC 0.8 (LL) 4.0 - 10.5 K/uL    Comment: REPEATED TO VERIFY WHITE COUNT CONFIRMED ON SMEAR THIS CRITICAL RESULT HAS VERIFIED AND BEEN CALLED TO RN C HODGES BY ALEXIS [place] ON 09 24 2020 AT 2347, AND HAS BEEN READ BACK.     RBC 2.41 (L) 4.22 - 5.81 MIL/uL   Hemoglobin 8.1 (L) 13.0 - 17.0 g/dL   HCT 24.0 (L) 39.0 - 52.0 %   MCV 99.6 80.0 - 100.0 fL   MCH 33.6 26.0 - 34.0 pg   MCHC 33.8 30.0 - 36.0 g/dL   RDW 15.1 11.5 - 15.5 %   Platelets 155 150 - 400 K/uL   nRBC 0.0 0.0 - 0.2 %   Neutrophils Relative % 38 %   Neutro Abs 0.3 (L) 1.7 - 7.7 K/uL   Lymphocytes Relative 17 %   Lymphs Abs 0.1 (L) 0.7 - 4.0 K/uL   Monocytes Relative 10 %   Monocytes Absolute 0.1 0.1 - 1.0 K/uL   Eosinophils Relative 35 %   Eosinophils Absolute 0.3 0.0 - 0.5 K/uL   Basophils Relative 0 %   Basophils Absolute 0.0 0.0 - 0.1 K/uL   Immature Granulocytes 0 %   Abs Immature Granulocytes 0.00 0.00 - 0.07 K/uL   Ovalocytes  PRESENT     Comment: Performed at [facility], [place] [address], [place], [place] [address]  APTT     Status: None   Collection Time: 01/02/19  10:19 PM  Result Value Ref Range   aPTT 31 24 - 36 seconds    Comment: Performed at [facility], [place] [address], [place], [place] [address]  Protime-INR     Status: None   Collection Time: 01/02/19 10:19 PM  Result Value Ref Range   Prothrombin Time 15.0 11.4 - 15.2 seconds   INR 1.2 0.8 - 1.2    Comment: (NOTE) INR goal varies based on device and disease states. Performed at [facility], [place] [address], [place], [place] [address]   Lactic acid, plasma     Status: None   Collection Time: 01/03/19 12:46 AM  Result Value Ref Range   Lactic Acid, Venous 0.8 0.5 - 1.9 mmol/L    Comment: Performed at [facility], [place] [address], [place], [place] [address]  Urinalysis, Routine w reflex microscopic     Status: Abnormal   Collection Time: 01/03/19 12:46 AM  Result Value Ref Range   Color, Urine STRAW (A) YELLOW   APPearance CLEAR CLEAR   Specific Gravity, Urine 1.008 1.005 - 1.030   pH 5.0 5.0 - 8.0   Glucose, UA NEGATIVE NEGATIVE mg/dL   Hgb urine dipstick MODERATE (A) NEGATIVE   Bilirubin Urine NEGATIVE NEGATIVE   Ketones, ur NEGATIVE NEGATIVE mg/dL   Protein, ur NEGATIVE NEGATIVE mg/dL   Nitrite NEGATIVE NEGATIVE   Leukocytes,Ua NEGATIVE NEGATIVE   RBC / HPF 0-5 0 - 5 RBC/hpf   WBC, UA 0-5 0 - 5 WBC/hpf   Bacteria, UA NONE SEEN NONE SEEN    Comment: Performed at [facility], [place] [address], [place], [place] [address]  SARS Coronavirus 2 [facility] order, Performed in [facility] hospital lab) Nasopharyngeal Nasopharyngeal Swab     Status: None   Collection Time: 01/03/19 12:46 AM   Specimen: Nasopharyngeal Swab  Result Value Ref Range   SARS Coronavirus 2 NEGATIVE NEGATIVE    Comment: (NOTE) If result is NEGATIVE SARS-CoV-2 target nucleic acids are NOT DETECTED. The SARS-CoV-2 RNA is generally detectable in upper and lower  respiratory specimens during the acute phase of infection. The lowest   concentration of SARS-CoV-2 viral copies this assay can detect is 250  copies / mL. A negative result does not preclude SARS-CoV-2 infection  and should not be used as the sole basis for treatment or other  patient management decisions.  A negative result may occur with  improper specimen collection / handling, submission of specimen other  than nasopharyngeal swab, presence of viral mutation(s) within the  areas targeted by this assay, and inadequate number of viral copies  (<250 copies / mL). A negative result must be combined with clinical  observations, patient history, and epidemiological information. If result is POSITIVE SARS-CoV-2 target nucleic acids are DETECTED. The SARS-CoV-2 RNA is generally detectable in upper and lower  respiratory specimens dur ing the acute phase of infection.  Positive  results are indicative of active infection with SARS-CoV-2.  Clinical  correlation with patient history and other diagnostic information is  necessary to determine patient infection status.  Positive results do  not rule out bacterial infection or co-infection with other viruses. If result is PRESUMPTIVE POSTIVE SARS-CoV-2 nucleic acids MAY BE PRESENT.   A presumptive positive result was obtained on the submitted  specimen  and confirmed on repeat testing.  While 2019 novel coronavirus  (SARS-CoV-2) nucleic acids may be present in the submitted sample  additional confirmatory testing may be necessary for epidemiological  and / or clinical management purposes  to differentiate between  SARS-CoV-2 and other Sarbecovirus currently known to infect humans.  If clinically indicated additional testing with an alternate test  methodology [phone]) is advised. The SARS-CoV-2 RNA is generally  detectable in upper and lower respiratory sp ecimens during the acute  phase of infection. The expected result is Negative. Fact Sheet for Patients:  StrictlyIdeas.no Fact Sheet  for Healthcare Providers: BankingDealers.co.za This test is not yet approved or cleared by the [place] FDA and has been authorized for detection and/or diagnosis of SARS-CoV-2 by FDA under an Emergency Use Authorization (EUA).  This EUA will remain in effect (meaning this test can be used) for the duration of the COVID-19 declaration under Section 564(b)(1) of the Act, 21 U.S.C. section 360bbb-3(b)(1), unless the authorization is terminated or revoked sooner. Performed at [facility], [place] [address], [place], [place] [address]   Comprehensive metabolic panel     Status: Abnormal   Collection Time: 01/03/19  5:59 AM  Result Value Ref Range   Sodium 135 135 - 145 mmol/L   Potassium 4.0 3.5 - 5.1 mmol/L   Chloride 110 98 - 111 mmol/L   CO2 16 (L) 22 - 32 mmol/L   Glucose, Bld 114 (H) 70 - 99 mg/dL   BUN 68 (H) 8 - 23 mg/dL   Creatinine, Ser 1.58 (H) 0.61 - 1.24 mg/dL   Calcium 7.5 (L) 8.9 - 10.3 mg/dL   Total Protein 4.4 (L) 6.5 - 8.1 g/dL   Albumin 2.4 (L) 3.5 - 5.0 g/dL   AST 16 15 - 41 U/L   ALT 23 0 - 44 U/L   Alkaline Phosphatase 35 (L) 38 - 126 U/L   Total Bilirubin 0.6 0.3 - 1.2 mg/dL   GFR calc non Af Amer 41 (L) >60 mL/min   GFR calc Af Amer 48 (L) >60 mL/min   Anion gap 9 5 - 15    Comment: Performed at [facility], [place] [address], [place], [place] [address]  CBC WITH DIFFERENTIAL     Status: Abnormal   Collection Time: 01/03/19  5:59 AM  Result Value Ref Range   WBC 1.0 (LL) 4.0 - 10.5 K/uL    Comment: CRITICAL VALUE NOTED.  VALUE IS CONSISTENT WITH PREVIOUSLY REPORTED AND CALLED VALUE.   RBC 2.25 (L) 4.22 - 5.81 MIL/uL   Hemoglobin 7.3 (L) 13.0 - 17.0 g/dL   HCT 22.9 (L) 39.0 - 52.0 %   MCV 101.8 (H) 80.0 - 100.0 fL   MCH 32.4 26.0 - 34.0 pg   MCHC 31.9 30.0 - 36.0 g/dL   RDW 15.3 11.5 - 15.5 %   Platelets 123 (L) 150 - 400 K/uL   nRBC 0.0 0.0 - 0.2 %   Neutrophils Relative % 55 %   Neutro Abs  0.6 (L) 1.7 - 7.7 K/uL   Lymphocytes Relative 32 %   Lymphs Abs 0.3 (L) 0.7 - 4.0 K/uL   Monocytes Relative 10 %   Monocytes Absolute 0.1 0.1 - 1.0 K/uL   Eosinophils Relative 3 %   Eosinophils Absolute 0.0 0.0 - 0.5 K/uL   Basophils Relative 0 %   Basophils Absolute 0.0 0.0 - 0.1 K/uL   Abs Immature Granulocytes 0.00 0.00 - 0.07 K/uL  Polychromasia PRESENT    Ovalocytes PRESENT     Comment: Performed at [facility], [place] [address] [place], [place] [address]  CBG monitoring, ED     Status: None   Collection Time: 01/03/19 10:21 AM  Result Value Ref Range   Glucose-Capillary 96 70 - 99 mg/dL  CBG monitoring, ED     Status: Abnormal   Collection Time: 01/03/19 12:25 PM  Result Value Ref Range   Glucose-Capillary 151 (H) 70 - 99 mg/dL   Dg Chest Port 1 View  Result Date: 01/02/2019 CLINICAL DATA:  Fever. EXAM: PORTABLE CHEST 1 VIEW COMPARISON:  None. FINDINGS: Low lung volumes limit assessment. Post median sternotomy and CABG. Left-sided pacemaker in place. Unchanged cardiomegaly. Bronchovascular crowding versus vascular congestion. Stable right hilar prominence from prior exam. Patchy infrahilar opacities, left greater than right, nonspecific cough low volume chest. No large pleural effusion. No pneumothorax. IMPRESSION: 1. Low lung volumes with bronchovascular crowding versus vascular congestion. 2. Patchy infrahilar opacities, left greater than right. Limited assessment on low volume exam, favor atelectasis however pneumonia not excluded in the setting of fever. Recommend PA and lateral views on patient is able. 3. Post CABG with mild cardiomegaly, likely accentuated by technique. Electronically Signed   By: Keith Rake M.D.   On: 01/02/2019 22:39    Pending Labs Unresulted Labs (From admission, onward)    Start     Ordered   01/10/19 0500  Creatinine, serum  (enoxaparin (LOVENOX)    CrCl >/= 30 ml/min)  Weekly,   R    Comments: while on enoxaparin therapy     01/03/19 0521   01/04/19 0500  Magnesium  Tomorrow morning,   R     01/03/19 1219   01/04/19 0500  Phosphorus  Tomorrow morning,   R     01/03/19 1219   01/04/19 0500  CBC with Differential/Platelet  Tomorrow morning,   R     01/03/19 1219   01/04/19 0500  Comprehensive metabolic panel  Tomorrow morning,   R     01/03/19 1219   01/03/19 1218  Type and screen [place]  Once,   STAT    Comments: [place]    01/03/19 1218   01/03/19 1218  Prepare RBC  (Adult Blood Administration - Red Blood Cells)  Once,   R    Question Answer Comment  # of Units 1 unit   Transfusion Indications Symptomatic Anemia   If emergent release call blood bank Not emergent release   Date/Time blood product needed 01/03/2019   Instructions: Transfuse      01/03/19 1218   01/03/19 0520  CBC  (enoxaparin (LOVENOX)    CrCl >/= 30 ml/min)  Once,   STAT    Comments: Baseline for enoxaparin therapy IF NOT ALREADY DRAWN.  Notify MD if PLT < 100 K.    01/03/19 0521   01/03/19 0519  Culture, sputum-assessment  Once,   R     01/03/19 0521   01/03/19 0519  Legionella Pneumophila Serogp 1 Ur Ag  Once,   STAT     01/03/19 0521   01/03/19 0519  Strep pneumoniae urinary antigen  Once,   STAT     01/03/19 0521   01/02/19 2219  Blood Culture (routine x 2)  BLOOD CULTURE X 2,   STAT     01/02/19 2220   01/02/19 2219  Urine culture  ONCE - STAT,   STAT  01/02/19 2220          Vitals/Pain Today's Vitals   01/03/19 1330 01/03/19 1400 01/03/19 1436 01/03/19 1500  BP: (!) 104/55 119/62 106/66 109/64  Pulse: 79 76 84 74  Resp: _0 Temp:      TempSrc:      SpO2: 96% 95% 95% 94%  Weight:      Height:      PainSc:        Isolation Precautions No active isolations  Medications Medications  aspirin EC tablet 81 mg (has no administration in time range)  rosuvastatin (CRESTOR) tablet 20 mg (has no administration in time range)  citalopram (CELEXA) tablet 10  mg (10 mg Oral Given 01/03/19 1023)  methylphenidate (RITALIN) tablet 5 mg (5 mg Oral Given 01/03/19 1348)  traZODone (DESYREL) tablet 25 mg (has no administration in time range)  levothyroxine (SYNTHROID) tablet 137 mcg (137 mcg Oral Given 01/03/19 0734)  predniSONE (DELTASONE) tablet 5 mg (5 mg Oral Given 01/03/19 1023)  gabapentin (NEURONTIN) capsule 100 mg (100 mg Oral Given 01/03/19 1023)  acetaminophen (TYLENOL) tablet 650 mg (has no administration in time range)    Or  acetaminophen (TYLENOL) suppository 650 mg (has no administration in time range)  ondansetron (ZOFRAN) tablet 4 mg (has no administration in time range)    Or  ondansetron (ZOFRAN) injection 4 mg (has no administration in time range)  insulin aspart (novoLOG) injection 0-9 Units (2 Units Subcutaneous Given 01/03/19 1349)  enoxaparin (LOVENOX) injection 40 mg (40 mg Subcutaneous Given 01/03/19 1109)  azithromycin (ZITHROMAX) 500 mg in sodium chloride 0.9 % 250 mL IVPB (0 mg Intravenous Stopped 01/03/19 1350)  ceFEPIme (MAXIPIME) 2 g in sodium chloride 0.9 % 100 mL IVPB (0 g Intravenous Stopped 01/03/19 1350)  vancomycin (VANCOCIN) 1,250 mg in sodium chloride 0.9 % 250 mL IVPB (has no administration in time range)  0.9 %  sodium chloride infusion (Manually program via Guardrails IV Fluids) (has no administration in time range)  sodium chloride 0.9 % bolus 1,000 mL (0 mLs Intravenous Stopped 01/03/19 0136)    And  sodium chloride 0.9 % bolus 1,000 mL (0 mLs Intravenous Stopped 01/03/19 0136)    And  sodium chloride 0.9 % bolus 1,000 mL (0 mLs Intravenous Stopped 01/03/19 0240)  piperacillin-tazobactam (ZOSYN) IVPB 3.375 g (0 g Intravenous Stopped 01/03/19 0136)  acetaminophen (TYLENOL) tablet 650 mg (650 mg Oral Given 01/03/19 0349)  hydrocortisone sodium succinate (SOLU-CORTEF) 100 MG injection 50 mg (50 mg Intravenous Given 01/03/19 1349)  vancomycin (VANCOCIN) 2,000 mg in sodium chloride 0.9 % 500 mL IVPB (0 mg Intravenous Stopped  01/03/19 1350)    Mobility walks

## 2019-01-03 NOTE — Telephone Encounter (Signed)
Left detailed message on Amy pope voicemail with verbal approval

## 2019-01-04 LAB — MRSA PCR SCREENING: MRSA by PCR: NEGATIVE

## 2019-01-04 LAB — CBC WITH DIFFERENTIAL/PLATELET
Abs Immature Granulocytes: 0.01 10*3/uL (ref 0.00–0.07)
Basophils Absolute: 0 10*3/uL (ref 0.0–0.1)
Basophils Relative: 0 %
Eosinophils Absolute: 0 10*3/uL (ref 0.0–0.5)
Eosinophils Relative: 3 %
HCT: 24.6 % — ABNORMAL LOW (ref 39.0–52.0)
Hemoglobin: 8.2 g/dL — ABNORMAL LOW (ref 13.0–17.0)
Immature Granulocytes: 1 %
Lymphocytes Relative: 28 %
Lymphs Abs: 0.2 10*3/uL — ABNORMAL LOW (ref 0.7–4.0)
MCH: 32.8 pg (ref 26.0–34.0)
MCHC: 33.3 g/dL (ref 30.0–36.0)
MCV: 98.4 fL (ref 80.0–100.0)
Monocytes Absolute: 0.1 10*3/uL (ref 0.1–1.0)
Monocytes Relative: 14 %
Neutro Abs: 0.4 10*3/uL — ABNORMAL LOW (ref 1.7–7.7)
Neutrophils Relative %: 54 %
Platelets: 121 10*3/uL — ABNORMAL LOW (ref 150–400)
RBC: 2.5 MIL/uL — ABNORMAL LOW (ref 4.22–5.81)
RDW: 15.6 % — ABNORMAL HIGH (ref 11.5–15.5)
WBC: 0.7 10*3/uL — CL (ref 4.0–10.5)
nRBC: 0 % (ref 0.0–0.2)

## 2019-01-04 LAB — TYPE AND SCREEN
ABO/RH(D): O POS
Antibody Screen: NEGATIVE
Unit division: 0

## 2019-01-04 LAB — PHOSPHORUS: Phosphorus: 3.1 mg/dL (ref 2.5–4.6)

## 2019-01-04 LAB — COMPREHENSIVE METABOLIC PANEL
ALT: 19 U/L (ref 0–44)
AST: 15 U/L (ref 15–41)
Albumin: 2.5 g/dL — ABNORMAL LOW (ref 3.5–5.0)
Alkaline Phosphatase: 29 U/L — ABNORMAL LOW (ref 38–126)
Anion gap: 8 (ref 5–15)
BUN: 52 mg/dL — ABNORMAL HIGH (ref 8–23)
CO2: 19 mmol/L — ABNORMAL LOW (ref 22–32)
Calcium: 7.4 mg/dL — ABNORMAL LOW (ref 8.9–10.3)
Chloride: 109 mmol/L (ref 98–111)
Creatinine, Ser: 1.31 mg/dL — ABNORMAL HIGH (ref 0.61–1.24)
GFR calc Af Amer: 60 mL/min — ABNORMAL LOW (ref >60)
GFR calc non Af Amer: 51 mL/min — ABNORMAL LOW (ref >60)
Glucose, Bld: 101 mg/dL — ABNORMAL HIGH (ref 70–99)
Potassium: 3.9 mmol/L (ref 3.5–5.1)
Sodium: 136 mmol/L (ref 135–145)
Total Bilirubin: 0.7 mg/dL (ref 0.3–1.2)
Total Protein: 4.7 g/dL — ABNORMAL LOW (ref 6.5–8.1)

## 2019-01-04 LAB — ABO/RH: ABO/RH(D): O POS

## 2019-01-04 LAB — URINE CULTURE: Culture: NO GROWTH

## 2019-01-04 LAB — GLUCOSE, CAPILLARY
Glucose-Capillary: 88 mg/dL (ref 70–99)
Glucose-Capillary: 176 mg/dL — ABNORMAL HIGH (ref 70–99)
Glucose-Capillary: 145 mg/dL — ABNORMAL HIGH (ref 70–99)
Glucose-Capillary: 128 mg/dL — ABNORMAL HIGH (ref 70–99)

## 2019-01-04 LAB — BPAM RBC
Blood Product Expiration Date: 202010262359
ISSUE DATE / TIME: 202009251759
Unit Type and Rh: 5100

## 2019-01-04 LAB — TSH: TSH: 5.056 u[IU]/mL — ABNORMAL HIGH (ref 0.350–4.500)

## 2019-01-04 LAB — MAGNESIUM: Magnesium: 2.1 mg/dL (ref 1.7–2.4)

## 2019-01-04 MED ORDER — LEVALBUTEROL HCL 0.63 MG/3ML IN NEBU
0.6300 mg | INHALATION_SOLUTION | Freq: Four times a day (QID) | RESPIRATORY_TRACT | Status: DC
  Administered 2019-01-04: 0.63 mg via RESPIRATORY_TRACT
  Filled 2019-01-04: qty 3

## 2019-01-04 MED ORDER — IPRATROPIUM BROMIDE 0.02 % IN SOLN
0.5000 mg | Freq: Two times a day (BID) | RESPIRATORY_TRACT | Status: DC
  Administered 2019-01-04: 20:00:00 0.5 mg via RESPIRATORY_TRACT
  Filled 2019-01-04: qty 2.5

## 2019-01-04 MED ORDER — LEVALBUTEROL HCL 0.63 MG/3ML IN NEBU: 0.6300 mg | INHALATION_SOLUTION | Freq: Four times a day (QID) | RESPIRATORY_TRACT | Status: DC | PRN

## 2019-01-04 MED ORDER — TBO-FILGRASTIM 480 MCG/0.8ML ~~LOC~~ SOSY
480.0000 ug | PREFILLED_SYRINGE | Freq: Once | SUBCUTANEOUS | Status: AC
  Administered 2019-01-04: 17:00:00 480 ug via SUBCUTANEOUS
  Filled 2019-01-04: qty 0.8

## 2019-01-04 MED ORDER — LEVALBUTEROL HCL 0.63 MG/3ML IN NEBU
0.6300 mg | INHALATION_SOLUTION | Freq: Two times a day (BID) | RESPIRATORY_TRACT | Status: DC
  Administered 2019-01-04: 20:00:00 0.63 mg via RESPIRATORY_TRACT
  Filled 2019-01-04: qty 3

## 2019-01-04 MED ORDER — IPRATROPIUM BROMIDE 0.02 % IN SOLN
0.5000 mg | Freq: Four times a day (QID) | RESPIRATORY_TRACT | Status: DC
  Administered 2019-01-04: 0.5 mg via RESPIRATORY_TRACT
  Filled 2019-01-04: qty 2.5

## 2019-01-04 MED ORDER — SODIUM BICARBONATE-DEXTROSE 150-5 MEQ/L-% IV SOLN
150.0000 meq | INTRAVENOUS | Status: AC
  Administered 2019-01-04: 150 meq via INTRAVENOUS
  Filled 2019-01-04: qty 1000

## 2019-01-04 MED ORDER — GUAIFENESIN ER 600 MG PO TB12
1200.0000 mg | ORAL_TABLET | Freq: Two times a day (BID) | ORAL | Status: DC
  Administered 2019-01-04 – 2019-01-10 (×13): 1200 mg via ORAL
  Filled 2019-01-04 (×13): qty 2

## 2019-01-04 NOTE — Progress Notes (Signed)
   01/04/19 1403  MEWS Score  Temp (!) 102.1 F (38.9 C)  MEWS Score  MEWS RR 0  MEWS Pulse 0  MEWS Systolic 0  MEWS LOC 0  MEWS Temp 2  MEWS Score 2  MEWS Score Color Yellow  MEWS Assessment  Is this an acute change? No   Pt admitted for neutropenic fevers. Pt given PRN tylenol per eMAR. MD paged and aware.

## 2019-01-04 NOTE — Progress Notes (Signed)
Patient brought home CPAP. Cord has no frays or wires showing.CPAP plugged into red outlet

## 2019-01-04 NOTE — Progress Notes (Signed)
PROGRESS NOTE    David [name].  ONG:[id number] DOB: June 12, 1940 DOA: 01/02/2019 PCP: [name], MD   Brief Narrative:  David [name], [name]. is a 78 year old Caucasian male with a past medical history significant for but not limited to history of metastatic malignant melanoma on immunotherapy, history of prostate cancer, diabetes mellitus type 2, hypothyroidism, history of complete heart block status post pacemaker placement, CAD status post CABG, history of ischemic cardiomyopathy status post ICD and other comorbidities who presented with fevers and chills at home with a temperature of 101.  Family was concerned and brought him to the ER he was found to be neutropenic.  Chest x-ray showed a likely pneumonia and he is admitted for further work-up.  Patient's blood pressure remains on the lower side.  In the ED he was given 3 L of normal saline boluses and started on maintenance IV fluid hydration, stress dose steroids, as well as being placed on IV cefepime and IV azithromycin.  IV vancomycin was also added given his hypotension and because of his febrile neutropenia.  Medical oncology Dr. Burr Medico was consulted for further evaluation and giving Granix.  Patient was transfused 1 unit of PRBCs.  He continues to spike temperatures and ANC is still relatively low.  If he continues to spike temperatures and is not improving clinically by tomorrow we will change Cefepime to IV meropenem and in the interim we will obtain fungal cultures.  Assessment & Plan:   Principal Problem:   Neutropenic fever ([place]) Active Problems:   ICH (intracerebral hemorrhage) (HCC) - L frontal due to HTN vs CAA   OSA on CPAP   Biventricular ICD (implantable cardioverter-defibrillator) in place   Chronic kidney disease, stage III (moderate) (HCC)   Metastatic melanoma to lung [facility])   Acquired hypothyroidism   Ischemic cardiomyopathy   Hx of CABG   Heart block AV complete ([place])   Controlled type 2 diabetes mellitus  with hyperglycemia, without long-term current use of insulin (HCC)  Febrile Neutropenia -Civen that possibility of pneumonia patient is on empiric antibiotics with Cefepime and Zithromax. Follow cultures. Will also add vancomycin since patient's blood pressure is in the low normal and he is continuing to Spike Temperatures -Consulted patient's oncologist Dr. Burr Medico who recommended Granix and will continue Broad Spectrum Abx -Since patient was on prednisone will give stress dose steroids and resume home Prednisone -Blood Cx x2 pending and Showed No Growth to Date at 1 Day -Urinalysis appeared relatively stable and had moderate blood but no other remarkable findings -TMax today was 102.1 and WBC is 0.7 and ANC is 400 -For reason patient did not receive Granix last night so we will get it tonight -Obtain fungal cultures and check MRSA PCR -Continue antibiotics as above and supportive care -Continue neutropenic precautions  History of Hypertension  -We will hold antihypertensives due to low normal blood pressure.  -We will give stress dose steroids and resumed his home prednisone dosing afterwards -Blood pressure is 112/67  History of CAD status post CABG  -Denies any chest pain. On statins.  -Will hold metoprolol due to low normal blood pressure.  Chronic Steroids  -We will keep patient on prednisone but also give stress dose steroids on admission  Hypothyroidism  -Check TSH -On Synthroid 137 mcg p.o. daily.  Diabetes Mellitus Type 2  -We will keep patient on sliding scale coverage -CBGs have been ranging from 88-214.  History of ischemic cardiomyopathy status post ICD  -Presently appears compensated. -We will currently fluid  restricted 1200 mL -Continue sodium bicarb for at least 12 hours at 6 mL's per hour and reassess need for any further IV fluids -strict I's and O's and daily weights and continue to monitor for signs and symptoms of volume overload  Metastatic  Malignant Melanoma  -being followed by Dr. Burr Medico and have consulted her for further recc's -C/w Granix as above  History of prostate cancer. -Sees Dr. Burr Medico  Chronic kidney disease stage III  -Creatinine appears to be close to baseline and BUN/creatinine is now improved from yesterday at 66/1.58 -> 52/1.31 -Avoid nephrotoxic medications, contrast dyes as well as hypotension if possible -Repeat CMP in a.m.  History of complete heart block  -status post pacemaker placement. -c/w Telemetry  Pancytopenia -WBC on Admit was1000, hemoglobin/hematocrit was 7.3/22.9, and count was 23,000 -Discussed with Dr. Burr Medico and she started the patient on Granix -We will type and screen transfuse 1 unit of PRBCs and after blood transfusion hemoglobin/hematocrit is now 8.2/24.6 -Patient's WBC is now 0.7 and platelet count is 121,000 and ANC is 400 -Continue broad-spectrum antibiotic treatment as above and repeat CBC in a.m. -Further recommendations appreciated from Hematology   DVT prophylaxis: SCDs Code Status: Full Code Family Communication: No family present at bedside spoke with daughter over the phone Disposition Plan: Remain Inpatient for continued workup and treatment  Consultants:   Medical   Procedures: None   Antimicrobials: Anti-infectives (From admission, onward)   Start     Dose/Rate Route Frequency Ordered Stop   01/04/19 1200  vancomycin (VANCOCIN) 1,250 mg in sodium chloride 0.9 % 250 mL IVPB     1,250 mg 166.7 mL/hr over 90 Minutes Intravenous Every 24 hours 01/03/19 0657     01/03/19 0700  vancomycin (VANCOCIN) 2,000 mg in sodium chloride 0.9 % 500 mL IVPB     2,000 mg 250 mL/hr over 120 Minutes Intravenous  Once 01/03/19 0657 01/03/19 1350   01/03/19 0600  azithromycin (ZITHROMAX) 500 mg in sodium chloride 0.9 % 250 mL IVPB     500 mg 250 mL/hr over 60 Minutes Intravenous Every 24 hours 01/03/19 0521     01/03/19 0600  ceFEPIme (MAXIPIME) 2 g in sodium chloride 0.9 %  100 mL IVPB     2 g 200 mL/hr over 30 Minutes Intravenous Every 12 hours 01/03/19 0529     01/02/19 2230  piperacillin-tazobactam (ZOSYN) IVPB 3.375 g     3.375 g 100 mL/hr over 30 Minutes Intravenous  Once 01/02/19 2220 01/03/19 0136     Subjective: Seen and examined this morning he was little bit forgetful and states that he did not recognize me from yesterday.  No nausea or vomiting denies any shortness of breath but states that he did get a breathing treatment today.  Spiked a temperature later this afternoon.  No lightheadedness or dizziness but no other concerns or points at this time.  Objective: Vitals:   01/04/19 1000 01/04/19 1402 01/04/19 1403 01/04/19 1544  BP:  112/67    Pulse:  82    Resp:  20    Temp:  (!) 102.1 F (38.9 C) (!) 102.1 F (38.9 C) 99.6 F (37.6 C)  TempSrc:  Oral Oral Oral  SpO2: 95% 95%    Weight:      Height:        Intake/Output Summary (Last 24 hours) at 01/04/2019 1645 Last data filed at 01/04/2019 1535 Gross per 24 hour  Intake 1686.85 ml  Output 2750 ml  Net -1063.15 ml  Filed Weights   01/03/19 0437  Weight: 90.7 kg   Examination: Physical Exam:  Constitutional: Elderly Caucasian male currently in NAD and appears calm  Eyes: Lids and conjunctivae normal, sclerae anicteric  ENMT: External Ears, Nose appear normal. Grossly normal hearing. Mucous membranes are moist.  Neck: Appears normal, supple, no cervical masses, normal ROM, no appreciable thyromegaly; no JVD Respiratory: Diminished to auscultation bilaterally, no wheezing, rales, rhonchi or crackles. Normal respiratory effort and patient is not tachypenic. No accessory muscle use.  Cardiovascular: RRR, no murmurs / rubs / gallops. S1 and S2 auscultated. No extremity edema Abdomen: Soft, non-tender, non-distended. Bowel sounds positive x4.  GU: Deferred. Musculoskeletal: No clubbing / cyanosis of digits/nails. No joint deformity upper and lower extremities Skin: No rashes,  lesions, ulcers on a limited skin evaluation. No induration; Warm and dry.  Neurologic: CN 2-12 grossly intact with no focal deficits. Romberg sign and cerebellar reflexes not assessed.  Psychiatric: Slightly impaired judgment and insight as he is forgetful. Alert and awake. Mildly anxious mood and appropriate affect.   Data Reviewed: I have personally reviewed following labs and imaging studies  CBC: Recent Labs  Lab 01/02/19 0955 01/02/19 2219 01/03/19 0559 01/04/19 0539  WBC 0.7 Repeated and verified X2.* 0.8* 1.0* 0.7*  NEUTROABS 0.3* 0.3* 0.6* 0.4*  HGB 9.1* 8.1* 7.3* 8.2*  HCT 26.4* 24.0* 22.9* 24.6*  MCV 96.6 99.6 101.8* 98.4  PLT 210.0 155 123* 194*   Basic Metabolic Panel: Recent Labs  Lab 01/02/19 0955 01/02/19 2219 01/03/19 0559 01/04/19 0539  NA 135 133* 135 136  K 4.3 4.6 4.0 3.9  CL 103 104 110 109  CO2 23 19* 16* 19*  GLUCOSE 86 81 114* 101*  BUN 66* 78* 68* 52*  CREATININE 1.48 1.66* 1.58* 1.31*  CALCIUM 8.6 8.1* 7.5* 7.4*  MG  --   --   --  2.1  PHOS  --   --   --  3.1   GFR: Estimated Creatinine Clearance: 51.4 mL/min (A) (by C-G formula based on SCr of 1.31 mg/dL (H)). Liver Function Tests: Recent Labs  Lab 01/02/19 0955 01/02/19 2219 01/03/19 0559 01/04/19 0539  AST 16 22 16 15   ALT 22 25 23 19   ALKPHOS 45 38 35* 29*  BILITOT 0.5 0.9 0.6 0.7  PROT 5.6* 5.2* 4.4* 4.7*  ALBUMIN 3.5 2.8* 2.4* 2.5*   No results for input(s): LIPASE, AMYLASE in the last 168 hours. No results for input(s): AMMONIA in the last 168 hours. Coagulation Profile: Recent Labs  Lab 01/02/19 2219  INR 1.2   Cardiac Enzymes: No results for input(s): CKTOTAL, CKMB, CKMBINDEX, TROPONINI in the last 168 hours. BNP (last 3 results) No results for input(s): PROBNP in the last 8760 hours. HbA1C: No results for input(s): HGBA1C in the last 72 hours. CBG: Recent Labs  Lab 01/03/19 1225 01/03/19 1639 01/03/19 2139 01/04/19 0801 01/04/19 1206  GLUCAP 151* 214*  153* 88 176*   Lipid Profile: No results for input(s): CHOL, HDL, LDLCALC, TRIG, CHOLHDL, LDLDIRECT in the last 72 hours. Thyroid Function Tests: Recent Labs    01/02/19 0955  TSH 2.54  FREET4 1.01   Anemia Panel: Recent Labs    01/02/19 0955  FERRITIN 700.4*  IRON 119   Sepsis Labs: Recent Labs  Lab 01/02/19 2219 01/03/19 0046  LATICACIDVEN 0.9 0.8    Recent Results (from the past 240 hour(s))  Blood Culture (routine x 2)     Status: None (Preliminary result)   Collection  Time: 01/02/19 10:49 PM   Specimen: BLOOD  Result Value Ref Range Status   Specimen Description   Final    BLOOD RIGHT ANTECUBITAL Performed at [facility] [address] [place], [place] [address]    Special Requests   Final    BOTTLES DRAWN AEROBIC AND ANAEROBIC Blood Culture adequate volume Performed at [facility] [address], [place], [place] [address]    Culture   Final    NO GROWTH 1 DAY Performed at [facility], [place] [address], [place], [place] [address]    Report Status PENDING  Incomplete  Blood Culture (routine x 2)     Status: None (Preliminary result)   Collection Time: 01/02/19 10:49 PM   Specimen: BLOOD  Result Value Ref Range Status   Specimen Description   Final    BLOOD LEFT ANTECUBITAL Performed at [facility] [address] [place], [place] [address]    Special Requests   Final    BOTTLES DRAWN AEROBIC AND ANAEROBIC Blood Culture adequate volume Performed at [facility] [address] [place], [place] [address]    Culture   Final    NO GROWTH 1 DAY Performed at [facility], [place] [address], [place], [place] [address]    Report Status PENDING  Incomplete  Urine culture     Status: None   Collection Time: 01/03/19 12:46 AM   Specimen: In/Out Cath Urine  Result Value Ref Range Status   Specimen Description   Final    IN/OUT CATH URINE Performed at [facility] [address], [place], [place] [address]    Special Requests   Final    NONE Performed at [facility], [place] [address], [place], [place] [address]    Culture   Final    NO GROWTH Performed at [facility], [place] [address] [place], [place] [address]    Report Status 01/04/2019 FINAL  Final  SARS Coronavirus 2 [facility] order, Performed in [facility] hospital lab) Nasopharyngeal Nasopharyngeal Swab     Status: None   Collection Time: 01/03/19 12:46 AM   Specimen: Nasopharyngeal Swab  Result Value Ref Range Status   SARS Coronavirus 2 NEGATIVE NEGATIVE Final    Comment: (NOTE) If result is NEGATIVE SARS-CoV-2 target nucleic acids are NOT DETECTED. The SARS-CoV-2 RNA is generally detectable in upper and lower  respiratory specimens during the acute phase of infection. The lowest  concentration of SARS-CoV-2 viral copies this assay can detect is 250  copies / mL. A negative result does not preclude SARS-CoV-2 infection  and should not be used as the sole basis for treatment or other  patient management decisions.  A negative result may occur with  improper specimen collection / handling, submission of specimen other  than nasopharyngeal swab, presence of viral mutation(s) within the  areas targeted by this assay, and inadequate number of viral copies  (<250 copies / mL). A negative result must be combined with clinical  observations, patient history, and epidemiological information. If result is POSITIVE SARS-CoV-2 target nucleic acids are DETECTED. The SARS-CoV-2 RNA is generally detectable in upper and lower  respiratory specimens dur ing the acute phase of infection.  Positive  results are indicative of active infection with SARS-CoV-2.  Clinical  correlation with patient history and other diagnostic information is  necessary to determine patient infection status.  Positive results do  not rule out  bacterial infection or  co-infection with other viruses. If result is PRESUMPTIVE POSTIVE SARS-CoV-2 nucleic acids MAY BE PRESENT.   A presumptive positive result was obtained on the submitted specimen  and confirmed on repeat testing.  While 2019 novel coronavirus  (SARS-CoV-2) nucleic acids may be present in the submitted sample  additional confirmatory testing may be necessary for epidemiological  and / or clinical management purposes  to differentiate between  SARS-CoV-2 and other Sarbecovirus currently known to infect humans.  If clinically indicated additional testing with an alternate test  methodology [phone]) is advised. The SARS-CoV-2 RNA is generally  detectable in upper and lower respiratory sp ecimens during the acute  phase of infection. The expected result is Negative. Fact Sheet for Patients:  StrictlyIdeas.no Fact Sheet for Healthcare Providers: BankingDealers.co.za This test is not yet approved or cleared by the [place] FDA and has been authorized for detection and/or diagnosis of SARS-CoV-2 by FDA under an Emergency Use Authorization (EUA).  This EUA will remain in effect (meaning this test can be used) for the duration of the COVID-19 declaration under Section 564(b)(1) of the Act, 21 U.S.C. section 360bbb-3(b)(1), unless the authorization is terminated or revoked sooner. Performed at [facility], [place] [address] [place], [place] [address]     Radiology Studies: Dg Chest Port 1 View  Result Date: 01/02/2019 CLINICAL DATA:  Fever. EXAM: PORTABLE CHEST 1 VIEW COMPARISON:  None. FINDINGS: Low lung volumes limit assessment. Post median sternotomy and CABG. Left-sided pacemaker in place. Unchanged cardiomegaly. Bronchovascular crowding versus vascular congestion. Stable right hilar prominence from prior exam. Patchy infrahilar opacities, left greater than right, nonspecific cough low volume chest. No large pleural  effusion. No pneumothorax. IMPRESSION: 1. Low lung volumes with bronchovascular crowding versus vascular congestion. 2. Patchy infrahilar opacities, left greater than right. Limited assessment on low volume exam, favor atelectasis however pneumonia not excluded in the setting of fever. Recommend PA and lateral views on patient is able. 3. Post CABG with mild cardiomegaly, likely accentuated by technique. Electronically Signed   By: Keith Rake M.D.   On: 01/02/2019 22:39   Scheduled Meds: . sodium chloride   Intravenous Once  . aspirin EC  81 mg Oral QPM  . citalopram  10 mg Oral Daily  . enoxaparin (LOVENOX) injection  40 mg Subcutaneous Q24H  . gabapentin  100 mg Oral BID  . guaiFENesin  1,200 mg Oral BID  . insulin aspart  0-9 Units Subcutaneous TID WC  . ipratropium  0.5 mg Nebulization BID  . levalbuterol  0.63 mg Nebulization BID  . levothyroxine  137 mcg Oral Q0600  . methylphenidate  5 mg Oral BID WC  . predniSONE  5 mg Oral Q breakfast  . rosuvastatin  20 mg Oral QHS  . Tbo-filgastrim (GRANIX) SQ  480 mcg Subcutaneous Once   Continuous Infusions: . azithromycin Stopped (01/04/19 1109)  . ceFEPime (MAXIPIME) IV 2 g (01/04/19 0603)  . sodium bicarbonate 150 mEq in dextrose 5% 1000 mL 75 mL/hr at 01/04/19 1500  . vancomycin Stopped (01/04/19 1335)     LOS: 1 day   Kerney Elbe, DO [facility] PAGER is on AMION  If 7PM-7AM, please contact night-coverage [email or web address] Password [facility] 01/04/2019, 4:45 PM

## 2019-01-05 ENCOUNTER — Inpatient Hospital Stay (HOSPITAL_COMMUNITY): Payer: Medicare Other

## 2019-01-05 LAB — CBC WITH DIFFERENTIAL/PLATELET
Abs Immature Granulocytes: 0.04 10*3/uL (ref 0.00–0.07)
Basophils Absolute: 0 10*3/uL (ref 0.0–0.1)
Basophils Relative: 0 %
Eosinophils Absolute: 0 10*3/uL (ref 0.0–0.5)
Eosinophils Relative: 1 %
HCT: 28.9 % — ABNORMAL LOW (ref 39.0–52.0)
Hemoglobin: 9.6 g/dL — ABNORMAL LOW (ref 13.0–17.0)
Immature Granulocytes: 1 %
Lymphocytes Relative: 8 %
Lymphs Abs: 0.2 10*3/uL — ABNORMAL LOW (ref 0.7–4.0)
MCH: 32.8 pg (ref 26.0–34.0)
MCHC: 33.2 g/dL (ref 30.0–36.0)
MCV: 98.6 fL (ref 80.0–100.0)
Monocytes Absolute: 0.2 10*3/uL (ref 0.1–1.0)
Monocytes Relative: 6 %
Neutro Abs: 2.5 10*3/uL (ref 1.7–7.7)
Neutrophils Relative %: 84 %
Platelets: 132 10*3/uL — ABNORMAL LOW (ref 150–400)
RBC: 2.93 MIL/uL — ABNORMAL LOW (ref 4.22–5.81)
RDW: 15.9 % — ABNORMAL HIGH (ref 11.5–15.5)
WBC: 2.9 10*3/uL — ABNORMAL LOW (ref 4.0–10.5)
nRBC: 0 % (ref 0.0–0.2)

## 2019-01-05 LAB — GLUCOSE, CAPILLARY
Glucose-Capillary: 89 mg/dL (ref 70–99)
Glucose-Capillary: 143 mg/dL — ABNORMAL HIGH (ref 70–99)
Glucose-Capillary: 110 mg/dL — ABNORMAL HIGH (ref 70–99)
Glucose-Capillary: 116 mg/dL — ABNORMAL HIGH (ref 70–99)

## 2019-01-05 LAB — COMPREHENSIVE METABOLIC PANEL
ALT: 20 U/L (ref 0–44)
AST: 16 U/L (ref 15–41)
Albumin: 2.6 g/dL — ABNORMAL LOW (ref 3.5–5.0)
Alkaline Phosphatase: 35 U/L — ABNORMAL LOW (ref 38–126)
Anion gap: 9 (ref 5–15)
BUN: 31 mg/dL — ABNORMAL HIGH (ref 8–23)
CO2: 23 mmol/L (ref 22–32)
Calcium: 7.9 mg/dL — ABNORMAL LOW (ref 8.9–10.3)
Chloride: 106 mmol/L (ref 98–111)
Creatinine, Ser: 1.17 mg/dL (ref 0.61–1.24)
GFR calc Af Amer: >60 mL/min (ref >60)
GFR calc non Af Amer: 59 mL/min — ABNORMAL LOW (ref >60)
Glucose, Bld: 87 mg/dL (ref 70–99)
Potassium: 4.2 mmol/L (ref 3.5–5.1)
Sodium: 138 mmol/L (ref 135–145)
Total Bilirubin: 0.6 mg/dL (ref 0.3–1.2)
Total Protein: 5.3 g/dL — ABNORMAL LOW (ref 6.5–8.1)

## 2019-01-05 LAB — STREP PNEUMONIAE URINARY ANTIGEN: Strep Pneumo Urinary Antigen: NEGATIVE

## 2019-01-05 LAB — T4, FREE: Free T4: 1.55 ng/dL — ABNORMAL HIGH (ref 0.61–1.12)

## 2019-01-05 LAB — MAGNESIUM: Magnesium: 1.9 mg/dL (ref 1.7–2.4)

## 2019-01-05 LAB — PHOSPHORUS: Phosphorus: 1.8 mg/dL — ABNORMAL LOW (ref 2.5–4.6)

## 2019-01-05 LAB — INFLUENZA PANEL BY PCR (TYPE A & B)
Influenza A By PCR: NEGATIVE
Influenza B By PCR: NEGATIVE

## 2019-01-05 MED ORDER — SENNOSIDES-DOCUSATE SODIUM 8.6-50 MG PO TABS
1.0000 | ORAL_TABLET | Freq: Two times a day (BID) | ORAL | Status: DC
  Administered 2019-01-05 – 2019-01-10 (×11): 1 via ORAL
  Filled 2019-01-05 (×11): qty 1

## 2019-01-05 MED ORDER — SODIUM CHLORIDE 0.9 % IV SOLN
1.0000 g | INTRAVENOUS | Status: AC
  Administered 2019-01-05: 09:00:00 1 g via INTRAVENOUS
  Filled 2019-01-05: qty 1

## 2019-01-05 MED ORDER — SODIUM CHLORIDE 0.9 % IV SOLN
1.0000 g | Freq: Three times a day (TID) | INTRAVENOUS | Status: DC
  Administered 2019-01-05 – 2019-01-07 (×5): 1 g via INTRAVENOUS
  Filled 2019-01-05 (×6): qty 1

## 2019-01-05 MED ORDER — K PHOS MONO-SOD PHOS DI & MONO 155-852-130 MG PO TABS
500.0000 mg | ORAL_TABLET | Freq: Two times a day (BID) | ORAL | Status: AC
  Administered 2019-01-05 (×2): 500 mg via ORAL
  Filled 2019-01-05 (×2): qty 2

## 2019-01-05 MED ORDER — VANCOMYCIN HCL 10 G IV SOLR: 1500.0000 mg | INTRAVENOUS | Status: DC

## 2019-01-05 MED ORDER — OXYCODONE HCL 5 MG PO TABS
5.0000 mg | ORAL_TABLET | Freq: Four times a day (QID) | ORAL | Status: DC | PRN
  Administered 2019-01-05 – 2019-01-07 (×5): 5 mg via ORAL
  Filled 2019-01-05 (×5): qty 1

## 2019-01-05 MED ORDER — SODIUM CHLORIDE 0.9 % IV SOLN
100.0000 mg | Freq: Two times a day (BID) | INTRAVENOUS | Status: DC
  Administered 2019-01-05 – 2019-01-06 (×3): 100 mg via INTRAVENOUS
  Filled 2019-01-05 (×4): qty 100

## 2019-01-05 MED ORDER — POLYETHYLENE GLYCOL 3350 17 G PO PACK
17.0000 g | PACK | Freq: Two times a day (BID) | ORAL | Status: DC
  Administered 2019-01-05 – 2019-01-10 (×10): 17 g via ORAL
  Filled 2019-01-05 (×9): qty 1

## 2019-01-05 NOTE — Progress Notes (Signed)
Pharmacy Antibiotic Note  David [name]. is a 78 y.o. male admitted on 01/02/2019 with febrile neutropenia with possibility of pneumonia. Patient intiially placed on Vancomycin, Cefepime, and Azithromycin. Cefepime being broadened to Meropenem today. Pharmacy has been consulted for Vancomycin and Meropenem dosing.  Plan: Adjust Vancomycin to 1500mg  IV q24h for improved renal function.  Vancomycin levels at steady state, as indicated.  Meropenem 1g IV q8h.  Azithromycin 500mg  IV q24h per MD.  Monitor renal function, cultures, clinical course.    Height: 5' 9.5" (176.5 cm) Weight: 199 lb 15.3 oz (90.7 kg) IBW/kg (Calculated) : 71.85  Temp (24hrs), Avg:99.2 F (37.3 C), Min:98.4 F (36.9 C), Max:100.8 F (38.2 C)  Recent Labs  Lab 01/02/19 0955 01/02/19 2219 01/03/19 0046 01/03/19 0559 01/04/19 0539 01/05/19 0521  WBC 0.7 Repeated and verified X2.* 0.8*  --  1.0* 0.7* 2.9*  CREATININE 1.48 1.66*  --  1.58* 1.31* 1.17  LATICACIDVEN  --  0.9 0.8  --   --   --     Estimated Creatinine Clearance: 57.5 mL/min (by C-G formula based on SCr of 1.17 mg/dL).    Allergies  Allergen Reactions  . Ezetimibe Other (See Comments)    Unknown reaction  . Simvastatin Other (See Comments)    Reaction:  Gave pt a fever  Fever - temp of 103.   In 2003    Antimicrobials this admission: 9/24 Zosyn x 1 9/25 Cefepime >> 9/27 9/25 Vancomycin >> 9/25 Azithromycin >> 9/27 Meropenem >>  Microbiology results: 9/24 BCx: NGTD  9/25 UCx: NGF 9/25 COVID: negative  9/25 Strep pneumo ur ag: ordered 9/25 Legionella ur ag: ordered 9/26 MRSA PCR: negative 9/27 Fungus culture: ordered  Thank you for allowing pharmacy to be a part of this patient's care.   Lindell Spar, PharmD, BCPS Clinical Pharmacist  01/05/2019  9:07 AM

## 2019-01-05 NOTE — Progress Notes (Signed)
PROGRESS NOTE    David [name].  TJQ:[id number] DOB: April 10, 1941 DOA: 01/02/2019 PCP: [name], MD   Brief Narrative:  David [name], David [name]. is a 78 year old Caucasian male with a past medical history significant for but not limited to history of metastatic malignant melanoma on immunotherapy, history of prostate cancer, diabetes mellitus type 2, hypothyroidism, history of complete heart block status post pacemaker placement, CAD status post CABG, history of ischemic cardiomyopathy status post ICD and other comorbidities who presented with fevers and chills at home with a temperature of 101.  Family was concerned and brought him to the ER he was found to be neutropenic.  Chest x-ray showed a likely pneumonia and he is admitted for further work-up.  Patient's blood pressure remains on the lower side.  In the ED he was given 3 L of normal saline boluses and started on maintenance IV fluid hydration, stress dose steroids, as well as being placed on IV cefepime and IV azithromycin.  IV vancomycin was also added given his hypotension and because of his febrile neutropenia.  Medical oncology Dr. Burr Medico was consulted for further evaluation and giving Granix.  Patient was transfused 1 unit of PRBCs.  He continues to spike temperatures and ANC is still relatively low.  If he continues to spike temperatures and is not improving clinically by tomorrow we will change Cefepime to IV meropenem and in the interim we will obtain fungal cultures.  Patient also had a flu swab done which was negative and fungal blood culture is pending.  Assessment & Plan:   Principal Problem:   Neutropenic fever ([place]) Active Problems:   ICH (intracerebral hemorrhage) (HCC) - L frontal due to HTN vs CAA   OSA on CPAP   Biventricular ICD (implantable cardioverter-defibrillator) in place   Chronic kidney disease, stage III (moderate) (HCC)   Metastatic melanoma to lung [facility])   Acquired hypothyroidism   Ischemic  cardiomyopathy   Hx of CABG   Heart block AV complete ([place])   Controlled type 2 diabetes mellitus with hyperglycemia, without long-term current use of insulin (HCC)  Febrile Neutropenia mildly improving slowly -Given that possibility of pneumonia patient is on empiric antibiotics with Cefepime and Zithromax. Follow cultures. Added vancomycin since patient's blood pressure is in the low normal and he is continuing to Spike Temperatures but will stop now given Negative MRSA PCR -Consulted patient's oncologist Dr. Burr Medico who recommended Granix and will continue Broad Spectrum Abx -Since patient was on prednisone will give stress dose steroids and resume home Prednisone -Blood Cx x2 pending and Showed No Growth to Date at 2 Days -Urinalysis appeared relatively stable and had moderate blood but no other remarkable findings -TMax yesterday was 102.1 and this a.m. was 100.8 and WBC is 2.9 and ANC is 2500 -Received Granix and improved  -Obtain fungal cultures and check MRSA PCR; MRSA PCR was negative so we will stop IV vancomycin -Repeat chest x-ray showed no acute cardiopulmonary findings -Continue antibiotics as above and supportive care -Continue neutropenic precautions -Follow cultures and temperature curve -Discussed with the patient's daughter at length and because this is the patient's third hospitalization a few weeks she requested a palliative care consult with Dr. Hilma Favors for further goals of care discussion given that he is never had one -Medical Oncology arranging for a bone marrow biopsy and will likely need be done in the near future  History of Hypertension  -We will hold antihypertensives due to low normal blood pressure.  -We will give stress  dose steroids and resumed his home prednisone dosing afterwards -Blood pressure is 132/67  History of CAD status post CABG  -Denies any chest pain. On statins.  -Will hold metoprolol due to low normal blood pressure.  Chronic Steroids   -We will keep patient on prednisone but also give stress dose steroids on admission  Hypothyroidism  -Checked TSH on admission was 2.54 and worsened to 5.056 -T4 1 from 1.01 is now 1.55 -In the setting of patient being sick so we will repeat TFTs in 4 to 6 weeks after he is improved -On Synthroid 137 mcg p.o. daily and will continue  Diabetes Mellitus Type 2  -We will keep patient on sliding scale coverage -CBGs have been ranging from 89-143 -Continue to monitor and adjust insulin regimen as necessary  History of ischemic cardiomyopathy status post ICD  -Presently appears compensated. -We will currently fluid restricted 1200 mL -IV fluids have now been stopped -strict I's and O's and daily weights and continue to monitor for signs and symptoms of volume overload -Patient is +3.053 L since admission  Metastatic Malignant Melanoma  -being followed by Dr. Burr Medico and have consulted her for further recc's -Given Granix one-time dose and has improved his [facility]  History of Prostate Cancer. -Sees Dr. Burr Medico  Chronic kidney disease stage III  -Creatinine appears to be close to baseline and BUN/creatinine is now improved from yesterday at 66/1.58 -> 52/1.31 -> 31/1.17 -Avoid nephrotoxic medications, contrast dyes as well as hypotension if possible -Repeat CMP in a.m.  History of Complete heart block  -status post pacemaker placement. -c/w Telemetry  Pancytopenia -WBC on Admit was1000, hemoglobin/hematocrit was 7.3/22.9, and Platelet count was 123,000 -Discussed with Dr. Burr Medico and she started the patient on Granix -We will type and screen transfuse 1 unit of PRBCs and after blood transfusion hemoglobin/hematocrit is now 9.6/20.9 -Patient's WBC is now 2.9 and platelet count is 132,000 and ANC is 2500 -Continue broad-spectrum antibiotic treatment as above and repeat CBC in a.m. -Further recommendations appreciated from Hematology  Constipation -Bowel regimen was started with MiraLAX  17 g twice daily, senna docusate -Continue monitor  Hypophosphatemia -Patient's phosphorus level this morning was 1.8 -Replete with p.o. K-Phos Neutral 500 mg twice daily x2 doses -Continue to monitor and replete as necessary -Repeat phosphorus level in a.m.  DVT prophylaxis: SCDs Code Status: Full Code Family Communication: No family present at bedside spoke with daughter over the phone Disposition Plan: Remain Inpatient for continued workup and treatment  Consultants:   Medical Hematology  Palliative Care Medicine    Procedures: None   Antimicrobials: Anti-infectives (From admission, onward)   Start     Dose/Rate Route Frequency Ordered Stop   01/06/19 0800  vancomycin (VANCOCIN) 1,500 mg in sodium chloride 0.9 % 500 mL IVPB     1,500 mg 250 mL/hr over 120 Minutes Intravenous Every 24 hours 01/05/19 1618     01/05/19 1800  meropenem (MERREM) 1 g in sodium chloride 0.9 % 100 mL IVPB     1 g 200 mL/hr over 30 Minutes Intravenous Every 8 hours 01/05/19 0907     01/05/19 0930  meropenem (MERREM) 1 g in sodium chloride 0.9 % 100 mL IVPB     1 g 200 mL/hr over 30 Minutes Intravenous STAT 01/05/19 0906 01/05/19 0951   01/04/19 1200  vancomycin (VANCOCIN) 1,250 mg in sodium chloride 0.9 % 250 mL IVPB  Status:  Discontinued     1,250 mg 166.7 mL/hr over 90 Minutes Intravenous Every 24  hours 01/03/19 0657 01/05/19 1618   01/03/19 0700  vancomycin (VANCOCIN) 2,000 mg in sodium chloride 0.9 % 500 mL IVPB     2,000 mg 250 mL/hr over 120 Minutes Intravenous  Once 01/03/19 0657 01/03/19 1350   01/03/19 0600  azithromycin (ZITHROMAX) 500 mg in sodium chloride 0.9 % 250 mL IVPB     500 mg 250 mL/hr over 60 Minutes Intravenous Every 24 hours 01/03/19 0521     01/03/19 0600  ceFEPIme (MAXIPIME) 2 g in sodium chloride 0.9 % 100 mL IVPB  Status:  Discontinued     2 g 200 mL/hr over 30 Minutes Intravenous Every 12 hours 01/03/19 0529 01/05/19 0808   01/02/19 2230  piperacillin-tazobactam  (ZOSYN) IVPB 3.375 g     3.375 g 100 mL/hr over 30 Minutes Intravenous  Once 01/02/19 2220 01/03/19 0136     Subjective: Seen and examined this morning he spiked another temperature this morning and was a little down.  Wanting a bowel regimen for constipation.  No nausea or vomiting.  Was shivering a little bit.  No other concerns or complaints at this time I spoke with the daughter at length over the phone.  Antibiotics were broadened to IV meropenem however Granix appears to have improved his ANC.  Objective: Vitals:   01/04/19 2205 01/05/19 0619 01/05/19 0900 01/05/19 1436  BP: 117/69 109/69  132/67  Pulse: 77 85  97  Resp:  15  20  Temp: 98.5 F (36.9 C) (!) 100.8 F (38.2 C) 98.6 F (37 C) 99.9 F (37.7 C)  TempSrc: Oral Oral Oral Oral  SpO2: 99% 99%  93%  Weight:      Height:        Intake/Output Summary (Last 24 hours) at 01/05/2019 1705 Last data filed at 01/05/2019 1300 Gross per 24 hour  Intake 840 ml  Output 1500 ml  Net -660 ml   Filed Weights   01/03/19 0437  Weight: 90.7 kg   Examination: Physical Exam:  Constitutional: Elderly Caucasian male currently in NAD and appears calm and comfortable Eyes: Lids and conjunctivae normal, sclerae anicteric  ENMT: External Ears, Nose appear normal. Grossly normal hearing.  Neck: Appears normal, supple, no cervical masses, normal ROM, no appreciable thyromegaly; no JVD Respiratory: Diminished to auscultation bilaterally, no wheezing, rales, rhonchi or crackles. Normal respiratory effort and patient is not tachypenic. No accessory muscle use. Unlabored breathing  Cardiovascular: RRR, no murmurs / rubs / gallops. S1 and S2 auscultated.  Abdomen: Soft, non-tender, non-distended. Bowel sounds positive x4.  GU: Deferred. Musculoskeletal: No clubbing / cyanosis of digits/nails. No joint deformity upper and lower extremities. Skin: No rashes, lesions, ulcers on a limited skin evaluation. No induration; Warm and dry.   Neurologic: CN 2-12 grossly intact with no focal deficits. Romberg sign cerebellar reflexes not assessed.  Psychiatric: Normal judgment and insight. Alert and oriented x 3. Mildly anxious mood and appropriate affect.   Data Reviewed: I have personally reviewed following labs and imaging studies  CBC: Recent Labs  Lab 01/02/19 0955 01/02/19 2219 01/03/19 0559 01/04/19 0539 01/05/19 0521  WBC 0.7 Repeated and verified X2.* 0.8* 1.0* 0.7* 2.9*  NEUTROABS 0.3* 0.3* 0.6* 0.4* 2.5  HGB 9.1* 8.1* 7.3* 8.2* 9.6*  HCT 26.4* 24.0* 22.9* 24.6* 28.9*  MCV 96.6 99.6 101.8* 98.4 98.6  PLT 210.0 155 123* 121* 034*   Basic Metabolic Panel: Recent Labs  Lab 01/02/19 0955 01/02/19 2219 01/03/19 0559 01/04/19 0539 01/05/19 0521  NA 135 133* 135 136  138  K 4.3 4.6 4.0 3.9 4.2  CL 103 104 110 109 106  CO2 23 19* 16* 19* 23  GLUCOSE 86 81 114* 101* 87  BUN 66* 78* 68* 52* 31*  CREATININE 1.48 1.66* 1.58* 1.31* 1.17  CALCIUM 8.6 8.1* 7.5* 7.4* 7.9*  MG  --   --   --  2.1 1.9  PHOS  --   --   --  3.1 1.8*   GFR: Estimated Creatinine Clearance: 57.5 mL/min (by C-G formula based on SCr of 1.17 mg/dL). Liver Function Tests: Recent Labs  Lab 01/02/19 0955 01/02/19 2219 01/03/19 0559 01/04/19 0539 01/05/19 0521  AST _0 ALT _1 ALKPHOS 45 38 35* 29* 35*  BILITOT 0.5 0.9 0.6 0.7 0.6  PROT 5.6* 5.2* 4.4* 4.7* 5.3*  ALBUMIN 3.5 2.8* 2.4* 2.5* 2.6*   No results for input(s): LIPASE, AMYLASE in the last 168 hours. No results for input(s): AMMONIA in the last 168 hours. Coagulation Profile: Recent Labs  Lab 01/02/19 2219  INR 1.2   Cardiac Enzymes: No results for input(s): CKTOTAL, CKMB, CKMBINDEX, TROPONINI in the last 168 hours. BNP (last 3 results) No results for input(s): PROBNP in the last 8760 hours. HbA1C: No results for input(s): HGBA1C in the last 72 hours. CBG: Recent Labs  Lab 01/04/19 1651 01/04/19 2133 01/05/19 0743 01/05/19 1204 01/05/19  1650  GLUCAP 145* 128* 89 143* 110*   Lipid Profile: No results for input(s): CHOL, HDL, LDLCALC, TRIG, CHOLHDL, LDLDIRECT in the last 72 hours. Thyroid Function Tests: Recent Labs    01/03/19 2248 01/05/19 0915  TSH 5.056*  --   FREET4  --  1.55*   Anemia Panel: No results for input(s): VITAMINB12, FOLATE, FERRITIN, TIBC, IRON, RETICCTPCT in the last 72 hours. Sepsis Labs: Recent Labs  Lab 01/02/19 2219 01/03/19 0046  LATICACIDVEN 0.9 0.8    Recent Results (from the past 240 hour(s))  Blood Culture (routine x 2)     Status: None (Preliminary result)   Collection Time: 01/02/19 10:49 PM   Specimen: BLOOD  Result Value Ref Range Status   Specimen Description   Final    BLOOD RIGHT ANTECUBITAL Performed at [facility] [address], [place], [place] [address]    Special Requests   Final    BOTTLES DRAWN AEROBIC AND ANAEROBIC Blood Culture adequate volume Performed at [facility] [address], [place], [place] [address]    Culture   Final    NO GROWTH 2 DAYS Performed at [facility] [address], [place], [place] [address]    Report Status PENDING  Incomplete  Blood Culture (routine x 2)     Status: None (Preliminary result)   Collection Time: 01/02/19 10:49 PM   Specimen: BLOOD  Result Value Ref Range Status   Specimen Description   Final    BLOOD LEFT ANTECUBITAL Performed at [facility] [address], [place], [place] [address]    Special Requests   Final    BOTTLES DRAWN AEROBIC AND ANAEROBIC Blood Culture adequate volume Performed at [facility] [address] [place], [place] [address]    Culture   Final    NO GROWTH 2 DAYS Performed at [facility] [address], [place], [place] [address]    Report Status PENDING  Incomplete  Urine culture     Status: None   Collection Time:  01/03/19 12:46 AM   Specimen: In/Out Cath Urine  Result Value Ref Range  Status   Specimen Description   Final    IN/OUT CATH URINE Performed at [facility], [place] [address] [place], [place] [address]    Special Requests   Final    NONE Performed at [facility], [place] [address], [place], [place] [address]    Culture   Final    NO GROWTH Performed at [facility], [place] [address] [place], [place] [address]    Report Status 01/04/2019 FINAL  Final  SARS Coronavirus 2 [facility] order, Performed in [facility] hospital lab) Nasopharyngeal Nasopharyngeal Swab     Status: None   Collection Time: 01/03/19 12:46 AM   Specimen: Nasopharyngeal Swab  Result Value Ref Range Status   SARS Coronavirus 2 NEGATIVE NEGATIVE Final    Comment: (NOTE) If result is NEGATIVE SARS-CoV-2 target nucleic acids are NOT DETECTED. The SARS-CoV-2 RNA is generally detectable in upper and lower  respiratory specimens during the acute phase of infection. The lowest  concentration of SARS-CoV-2 viral copies this assay can detect is 250  copies / mL. A negative result does not preclude SARS-CoV-2 infection  and should not be used as the sole basis for treatment or other  patient management decisions.  A negative result may occur with  improper specimen collection / handling, submission of specimen other  than nasopharyngeal swab, presence of viral mutation(s) within the  areas targeted by this assay, and inadequate number of viral copies  (<250 copies / mL). A negative result must be combined with clinical  observations, patient history, and epidemiological information. If result is POSITIVE SARS-CoV-2 target nucleic acids are DETECTED. The SARS-CoV-2 RNA is generally detectable in upper and lower  respiratory specimens dur ing the acute phase of infection.  Positive  results are indicative of active infection with SARS-CoV-2.  Clinical  correlation with patient history and other diagnostic information is  necessary to determine patient  infection status.  Positive results do  not rule out bacterial infection or co-infection with other viruses. If result is PRESUMPTIVE POSTIVE SARS-CoV-2 nucleic acids MAY BE PRESENT.   A presumptive positive result was obtained on the submitted specimen  and confirmed on repeat testing.  While 2019 novel coronavirus  (SARS-CoV-2) nucleic acids may be present in the submitted sample  additional confirmatory testing may be necessary for epidemiological  and / or clinical management purposes  to differentiate between  SARS-CoV-2 and other Sarbecovirus currently known to infect humans.  If clinically indicated additional testing with an alternate test  methodology [phone]) is advised. The SARS-CoV-2 RNA is generally  detectable in upper and lower respiratory sp ecimens during the acute  phase of infection. The expected result is Negative. Fact Sheet for Patients:  StrictlyIdeas.no Fact Sheet for Healthcare Providers: BankingDealers.co.za This test is not yet approved or cleared by the [place] FDA and has been authorized for detection and/or diagnosis of SARS-CoV-2 by FDA under an Emergency Use Authorization (EUA).  This EUA will remain in effect (meaning this test can be used) for the duration of the COVID-19 declaration under Section 564(b)(1) of the Act, 21 U.S.C. section 360bbb-3(b)(1), unless the authorization is terminated or revoked sooner. Performed at [facility], [place] [address], [place], [place] [address]   MRSA PCR Screening     Status: None   Collection Time: 01/04/19  4:59 PM   Specimen: Nasal Mucosa; Nasopharyngeal  Result Value  Ref Range Status   MRSA by PCR NEGATIVE NEGATIVE Final    Comment:        The GeneXpert MRSA Assay (FDA approved for NASAL specimens only), is one component of a comprehensive MRSA colonization surveillance program. It is not intended to diagnose MRSA infection nor to  guide or monitor treatment for MRSA infections. Performed at [facility], [place] [address] [place], [place] [address]     Radiology Studies: Dg Chest Port 1 View  Result Date: 01/05/2019 CLINICAL DATA:  Shortness of breath EXAM: PORTABLE CHEST 1 VIEW COMPARISON:  01/02/2019 FINDINGS: 0359 hours. Asymmetric elevation right hemidiaphragm. The lungs are clear without focal pneumonia, edema, pneumothorax or pleural effusion. The cardiopericardial silhouette is within normal limits for size. Left permanent pacemaker noted. The visualized bony structures of the thorax are intact. Telemetry leads overlie the chest. IMPRESSION: No acute cardiopulmonary findings. Electronically Signed   By: Misty Stanley M.D.   On: 01/05/2019 08:39   Scheduled Meds: . sodium chloride   Intravenous Once  . aspirin EC  81 mg Oral QPM  . citalopram  10 mg Oral Daily  . enoxaparin (LOVENOX) injection  40 mg Subcutaneous Q24H  . gabapentin  100 mg Oral BID  . guaiFENesin  1,200 mg Oral BID  . insulin aspart  0-9 Units Subcutaneous TID WC  . levothyroxine  137 mcg Oral Q0600  . methylphenidate  5 mg Oral BID WC  . phosphorus  500 mg Oral BID  . polyethylene glycol  17 g Oral BID  . predniSONE  5 mg Oral Q breakfast  . rosuvastatin  20 mg Oral QHS  . senna-docusate  1 tablet Oral BID   Continuous Infusions: . azithromycin 500 mg (01/05/19 1007)  . meropenem (MERREM) IV    . [START ON 01/06/2019] vancomycin      LOS: 2 days   Kerney Elbe, DO [facility] PAGER is on [place]  If 7PM-7AM, please contact night-coverage [email or web address] Password [facility] 01/05/2019, 5:05 PM

## 2019-01-06 ENCOUNTER — Telehealth: Payer: Self-pay

## 2019-01-06 ENCOUNTER — Inpatient Hospital Stay (HOSPITAL_COMMUNITY): Payer: Medicare Other

## 2019-01-06 ENCOUNTER — Encounter: Payer: Self-pay | Admitting: Medical Oncology

## 2019-01-06 DIAGNOSIS — Z87891 Personal history of nicotine dependence: Secondary | ICD-10-CM

## 2019-01-06 DIAGNOSIS — C439 Malignant melanoma of skin, unspecified: Secondary | ICD-10-CM

## 2019-01-06 DIAGNOSIS — Z950 Presence of cardiac pacemaker: Secondary | ICD-10-CM

## 2019-01-06 DIAGNOSIS — R509 Fever, unspecified: Secondary | ICD-10-CM

## 2019-01-06 DIAGNOSIS — C7982 Secondary malignant neoplasm of genital organs: Secondary | ICD-10-CM

## 2019-01-06 DIAGNOSIS — M109 Gout, unspecified: Secondary | ICD-10-CM

## 2019-01-06 DIAGNOSIS — D61818 Other pancytopenia: Secondary | ICD-10-CM

## 2019-01-06 DIAGNOSIS — Z888 Allergy status to other drugs, medicaments and biological substances status: Secondary | ICD-10-CM

## 2019-01-06 DIAGNOSIS — Z872 Personal history of diseases of the skin and subcutaneous tissue: Secondary | ICD-10-CM

## 2019-01-06 DIAGNOSIS — C7800 Secondary malignant neoplasm of unspecified lung: Secondary | ICD-10-CM

## 2019-01-06 DIAGNOSIS — C7931 Secondary malignant neoplasm of brain: Secondary | ICD-10-CM

## 2019-01-06 LAB — COMPREHENSIVE METABOLIC PANEL
ALT: 18 U/L (ref 0–44)
AST: 13 U/L — ABNORMAL LOW (ref 15–41)
Albumin: 2.4 g/dL — ABNORMAL LOW (ref 3.5–5.0)
Alkaline Phosphatase: 34 U/L — ABNORMAL LOW (ref 38–126)
Anion gap: 13 (ref 5–15)
BUN: 26 mg/dL — ABNORMAL HIGH (ref 8–23)
CO2: 18 mmol/L — ABNORMAL LOW (ref 22–32)
Calcium: 7.6 mg/dL — ABNORMAL LOW (ref 8.9–10.3)
Chloride: 103 mmol/L (ref 98–111)
Creatinine, Ser: 1.3 mg/dL — ABNORMAL HIGH (ref 0.61–1.24)
GFR calc Af Amer: >60 mL/min (ref >60)
GFR calc non Af Amer: 52 mL/min — ABNORMAL LOW (ref >60)
Glucose, Bld: 100 mg/dL — ABNORMAL HIGH (ref 70–99)
Potassium: 4.2 mmol/L (ref 3.5–5.1)
Sodium: 134 mmol/L — ABNORMAL LOW (ref 135–145)
Total Bilirubin: 0.8 mg/dL (ref 0.3–1.2)
Total Protein: 5.2 g/dL — ABNORMAL LOW (ref 6.5–8.1)

## 2019-01-06 LAB — CBC WITH DIFFERENTIAL/PLATELET
Abs Immature Granulocytes: 0.1 10*3/uL — ABNORMAL HIGH (ref 0.00–0.07)
Basophils Absolute: 0 10*3/uL (ref 0.0–0.1)
Basophils Relative: 0 %
Eosinophils Absolute: 0.1 10*3/uL (ref 0.0–0.5)
Eosinophils Relative: 2 %
HCT: 29.1 % — ABNORMAL LOW (ref 39.0–52.0)
Hemoglobin: 9.5 g/dL — ABNORMAL LOW (ref 13.0–17.0)
Lymphocytes Relative: 12 %
Lymphs Abs: 0.3 10*3/uL — ABNORMAL LOW (ref 0.7–4.0)
MCH: 32.8 pg (ref 26.0–34.0)
MCHC: 32.6 g/dL (ref 30.0–36.0)
MCV: 100.3 fL — ABNORMAL HIGH (ref 80.0–100.0)
Metamyelocytes Relative: 2 %
Monocytes Absolute: 0.3 10*3/uL (ref 0.1–1.0)
Monocytes Relative: 10 %
Neutro Abs: 2 10*3/uL (ref 1.7–7.7)
Neutrophils Relative %: 74 %
Platelets: 139 10*3/uL — ABNORMAL LOW (ref 150–400)
RBC: 2.9 MIL/uL — ABNORMAL LOW (ref 4.22–5.81)
RDW: 15.8 % — ABNORMAL HIGH (ref 11.5–15.5)
WBC: 2.7 10*3/uL — ABNORMAL LOW (ref 4.0–10.5)
nRBC: 0 % (ref 0.0–0.2)

## 2019-01-06 LAB — GLUCOSE, CAPILLARY
Glucose-Capillary: 90 mg/dL (ref 70–99)
Glucose-Capillary: 259 mg/dL — ABNORMAL HIGH (ref 70–99)
Glucose-Capillary: 223 mg/dL — ABNORMAL HIGH (ref 70–99)
Glucose-Capillary: 213 mg/dL — ABNORMAL HIGH (ref 70–99)

## 2019-01-06 LAB — PHOSPHORUS: Phosphorus: 2.7 mg/dL (ref 2.5–4.6)

## 2019-01-06 LAB — T3: T3, Total: 46 ng/dL — ABNORMAL LOW (ref 71–180)

## 2019-01-06 LAB — URIC ACID: Uric Acid, Serum: 6.6 mg/dL (ref 3.7–8.6)

## 2019-01-06 LAB — MAGNESIUM: Magnesium: 1.7 mg/dL (ref 1.7–2.4)

## 2019-01-06 MED ORDER — PREDNISONE 20 MG PO TABS
40.0000 mg | ORAL_TABLET | Freq: Every day | ORAL | Status: DC
  Administered 2019-01-07: 40 mg via ORAL
  Filled 2019-01-06: qty 2

## 2019-01-06 MED ORDER — COLCHICINE 0.6 MG PO TABS
0.6000 mg | ORAL_TABLET | Freq: Two times a day (BID) | ORAL | Status: DC
  Administered 2019-01-06 – 2019-01-10 (×8): 0.6 mg via ORAL
  Filled 2019-01-06 (×9): qty 1

## 2019-01-06 MED ORDER — COLCHICINE 0.6 MG PO TABS
1.2000 mg | ORAL_TABLET | Freq: Once | ORAL | Status: AC
  Administered 2019-01-06: 18:00:00 1.2 mg via ORAL
  Filled 2019-01-06: qty 2

## 2019-01-06 MED ORDER — SODIUM BICARBONATE-DEXTROSE 150-5 MEQ/L-% IV SOLN
150.0000 meq | INTRAVENOUS | Status: DC
  Administered 2019-01-06: 12:00:00 150 meq via INTRAVENOUS
  Filled 2019-01-06 (×3): qty 1000

## 2019-01-06 MED ORDER — MAGNESIUM SULFATE IN D5W 1-5 GM/100ML-% IV SOLN
1.0000 g | Freq: Once | INTRAVENOUS | Status: AC
  Administered 2019-01-06: 12:00:00 1 g via INTRAVENOUS
  Filled 2019-01-06: qty 100

## 2019-01-06 NOTE — Progress Notes (Addendum)
David [name].   DOB:May 02, 1940   BZ#:[id number]   PQA#:[id number]  Oncology follow up note   Subjective: T-max 100.8 over the past 24 hours.  Somewhat confused this morning.  Really unable to provide me a lot of history.  He does not recall having any fevers or chills.  He denies chest pain or shortness of breath.  Denies abdominal pain, nausea, vomiting.  Denies bleeding.  Objective:  Vitals:   01/05/19 2057 01/06/19 0501  BP: 136/74 123/77  Pulse: 83 88  Resp: 19 18  Temp: 99.2 F (37.3 C) 99 F (37.2 C)  SpO2: 96% 99%    Body mass index is 29.11 kg/m.  Intake/Output Summary (Last 24 hours) at 01/06/2019 1242 Last data filed at 01/06/2019 1130 Gross per 24 hour  Intake 2597 ml  Output 3850 ml  Net -1253 ml     Sclerae unicteric  Oropharynx clear  No peripheral adenopathy  Lungs clear -- no rales or rhonchi  Heart regular rate and rhythm  Abdomen benign  MSK no focal spinal tenderness, no peripheral edema  Neuro nonfocal    CBG (last 3)  Recent Labs    01/05/19 2102 01/06/19 0735 01/06/19 1149  GLUCAP 116* 90 259*     Labs:  Lab Results  Component Value Date   WBC 2.7 (L) 01/06/2019   HGB 9.5 (L) 01/06/2019   HCT 29.1 (L) 01/06/2019   MCV 100.3 (H) 01/06/2019   PLT 139 (L) 01/06/2019   NEUTROABS 2.0 01/06/2019    Urine Studies No results for input(s): UHGB, CRYS in the last 72 hours.  Invalid input(s): UACOL, UAPR, USPG, UPH, UTP, UGL, UKET, UBIL, UNIT, UROB, [place], UEPI, UWBC, [place], [place], [place], [place], [place]  Basic Metabolic Panel: Recent Labs  Lab 01/02/19 2219 01/03/19 0559 01/04/19 0539 01/05/19 0521 01/06/19 0542  NA 133* 135 136 138 134*  K 4.6 4.0 3.9 4.2 4.2  CL 104 110 109 106 103  CO2 19* 16* 19* 23 18*  GLUCOSE 81 114* 101* 87 100*  BUN 78* 68* 52* 31* 26*  CREATININE 1.66* 1.58* 1.31* 1.17 1.30*  CALCIUM 8.1* 7.5* 7.4* 7.9* 7.6*  MG  --   --  2.1 1.9 1.7  PHOS  --   --  3.1 1.8* 2.7   GFR Estimated Creatinine Clearance:  51.7 mL/min (A) (by C-G formula based on SCr of 1.3 mg/dL (H)). Liver Function Tests: Recent Labs  Lab 01/02/19 2219 01/03/19 0559 01/04/19 0539 01/05/19 0521 01/06/19 0542  AST _0 13*  ALT _1 ALKPHOS 38 35* 29* 35* 34*  BILITOT 0.9 0.6 0.7 0.6 0.8  PROT 5.2* 4.4* 4.7* 5.3* 5.2*  ALBUMIN 2.8* 2.4* 2.5* 2.6* 2.4*   No results for input(s): LIPASE, AMYLASE in the last 168 hours. No results for input(s): AMMONIA in the last 168 hours. Coagulation profile Recent Labs  Lab 01/02/19 2219  INR 1.2    CBC: Recent Labs  Lab 01/02/19 2219 01/03/19 0559 01/04/19 0539 01/05/19 0521 01/06/19 0542  WBC 0.8* 1.0* 0.7* 2.9* 2.7*  NEUTROABS 0.3* 0.6* 0.4* 2.5 2.0  HGB 8.1* 7.3* 8.2* 9.6* 9.5*  HCT 24.0* 22.9* 24.6* 28.9* 29.1*  MCV 99.6 101.8* 98.4 98.6 100.3*  PLT 155 123* 121* 132* 139*   Cardiac Enzymes: No results for input(s): CKTOTAL, CKMB, CKMBINDEX, TROPONINI in the last 168 hours. BNP: Invalid input(s): POCBNP CBG: Recent Labs  Lab 01/05/19 1204 01/05/19 1650 01/05/19 2102 01/06/19 0735 01/06/19  1149  GLUCAP 143* 110* 116* 90 259*   D-Dimer No results for input(s): DDIMER in the last 72 hours. Hgb A1c No results for input(s): HGBA1C in the last 72 hours. Lipid Profile No results for input(s): CHOL, HDL, LDLCALC, TRIG, CHOLHDL, LDLDIRECT in the last 72 hours. Thyroid function studies Recent Labs    01/03/19 2248  TSH 5.056*   Anemia work up No results for input(s): VITAMINB12, FOLATE, FERRITIN, TIBC, IRON, RETICCTPCT in the last 72 hours. Microbiology Recent Results (from the past 240 hour(s))  Blood Culture (routine x 2)     Status: None (Preliminary result)   Collection Time: 01/02/19 10:49 PM   Specimen: BLOOD  Result Value Ref Range Status   Specimen Description   Final    BLOOD RIGHT ANTECUBITAL Performed at [facility] [address], [place], [place] [address]    Special Requests   Final    BOTTLES  DRAWN AEROBIC AND ANAEROBIC Blood Culture adequate volume Performed at [facility] [address], [place], [place] [address]    Culture   Final    NO GROWTH 3 DAYS Performed at [facility], [place] [address] [place], [place] [address]    Report Status PENDING  Incomplete  Blood Culture (routine x 2)     Status: None (Preliminary result)   Collection Time: 01/02/19 10:49 PM   Specimen: BLOOD  Result Value Ref Range Status   Specimen Description   Final    BLOOD LEFT ANTECUBITAL Performed at [facility] [address] [place], [place] [address]    Special Requests   Final    BOTTLES DRAWN AEROBIC AND ANAEROBIC Blood Culture adequate volume Performed at [facility] [address], [place], [place] [address]    Culture   Final    NO GROWTH 3 DAYS Performed at [facility], [place] [address], [place], [place] [address]    Report Status PENDING  Incomplete  Urine culture     Status: None   Collection Time: 01/03/19 12:46 AM   Specimen: In/Out Cath Urine  Result Value Ref Range Status   Specimen Description   Final    IN/OUT CATH URINE Performed at [facility] [address] [place], [place] [address]    Special Requests   Final    NONE Performed at [facility], [place] [address] [place], [place] [address]    Culture   Final    NO GROWTH Performed at [facility], [place] [address], [place], [place] [address]    Report Status 01/04/2019 FINAL  Final  SARS Coronavirus 2 [facility] order, Performed in [facility] hospital lab) Nasopharyngeal Nasopharyngeal Swab     Status: None   Collection Time: 01/03/19 12:46 AM   Specimen: Nasopharyngeal Swab  Result Value Ref Range Status   SARS Coronavirus 2 NEGATIVE NEGATIVE Final    Comment: (NOTE) If result is NEGATIVE SARS-CoV-2 target nucleic acids are NOT DETECTED. The SARS-CoV-2 RNA is generally detectable in upper  and lower  respiratory specimens during the acute phase of infection. The lowest  concentration of SARS-CoV-2 viral copies this assay can detect is 250  copies / mL. A negative result does not preclude SARS-CoV-2 infection  and should not be used as the sole basis for treatment or other  patient management decisions.  A negative result may occur with  improper specimen collection / handling, submission of specimen other  than nasopharyngeal swab, presence of viral mutation(s) within the  areas targeted by this assay, and inadequate number of viral copies  (<250 copies / mL). A negative result must be combined with clinical  observations, patient history, and epidemiological information. If result is POSITIVE SARS-CoV-2 target nucleic acids are DETECTED. The SARS-CoV-2 RNA is generally detectable in upper and lower  respiratory specimens dur ing the acute phase of infection.  Positive  results are indicative of active infection with SARS-CoV-2.  Clinical  correlation with patient history and other diagnostic information is  necessary to determine patient infection status.  Positive results do  not rule out bacterial infection or co-infection with other viruses. If result is PRESUMPTIVE POSTIVE SARS-CoV-2 nucleic acids MAY BE PRESENT.   A presumptive positive result was obtained on the submitted specimen  and confirmed on repeat testing.  While 2019 novel coronavirus  (SARS-CoV-2) nucleic acids may be present in the submitted sample  additional confirmatory testing may be necessary for epidemiological  and / or clinical management purposes  to differentiate between  SARS-CoV-2 and other Sarbecovirus currently known to infect humans.  If clinically indicated additional testing with an alternate test  methodology [phone]) is advised. The SARS-CoV-2 RNA is generally  detectable in upper and lower respiratory sp ecimens during the acute  phase of infection. The expected result is  Negative. Fact Sheet for Patients:  StrictlyIdeas.no Fact Sheet for Healthcare Providers: BankingDealers.co.za This test is not yet approved or cleared by the [place] FDA and has been authorized for detection and/or diagnosis of SARS-CoV-2 by FDA under an Emergency Use Authorization (EUA).  This EUA will remain in effect (meaning this test can be used) for the duration of the COVID-19 declaration under Section 564(b)(1) of the Act, 21 U.S.C. section 360bbb-3(b)(1), unless the authorization is terminated or revoked sooner. Performed at [facility], [place] [address] [place], [place] [address]   MRSA PCR Screening     Status: None   Collection Time: 01/04/19  4:59 PM   Specimen: Nasal Mucosa; Nasopharyngeal  Result Value Ref Range Status   MRSA by PCR NEGATIVE NEGATIVE Final    Comment:        The GeneXpert MRSA Assay (FDA approved for NASAL specimens only), is one component of a comprehensive MRSA colonization surveillance program. It is not intended to diagnose MRSA infection nor to guide or monitor treatment for MRSA infections. Performed at [facility], [place] [address] [place], [place] [address]   Fungus culture, blood     Status: None (Preliminary result)   Collection Time: 01/05/19  2:29 PM   Specimen: BLOOD  Result Value Ref Range Status   Specimen Description   Final    BLOOD BLOOD RIGHT HAND Performed at [facility] [address] [place], [place] [address]    Special Requests   Final    BOTTLES DRAWN AEROBIC ONLY Blood Culture adequate volume Performed at [facility] [address], [place], [place] [address]    Culture   Final    NO GROWTH < 24 HOURS Performed at [facility] [address] [place], [place] [address]    Report Status PENDING  Incomplete      Studies:  Ct Abdomen Pelvis Wo Contrast  Result Date:  01/06/2019 CLINICAL DATA:  Fever, metastatic melanoma EXAM: CT CHEST, ABDOMEN AND PELVIS WITHOUT CONTRAST TECHNIQUE: Multidetector CT imaging of the chest, abdomen and pelvis was performed following the standard protocol  without IV contrast. COMPARISON:  PET-CT, 09/18/2018 FINDINGS: CT CHEST FINDINGS Cardiovascular: Aortic atherosclerosis. Normal heart size. Three-vessel coronary artery calcifications and/or stents status post median sternotomy and CABG. Left chest multi lead pacer. No pericardial effusion. Mediastinum/Nodes: No enlarged mediastinal, hilar, or axillary lymph nodes. Thyroid gland, trachea, and esophagus demonstrate no significant findings. Lungs/Pleura: Small bilateral pleural effusions with associated atelectasis or consolidation. There is a small, stable, calcified benign nodule of the lingula measuring 5 mm (series 6, image 87). Musculoskeletal: No chest wall mass or suspicious bone lesions identified. CT ABDOMEN PELVIS FINDINGS Hepatobiliary: No solid liver abnormality is seen. No gallstones, gallbladder wall thickening, or biliary dilatation. Pancreas: Unremarkable. No pancreatic ductal dilatation or surrounding inflammatory changes. Spleen: Normal in size without significant abnormality. Adrenals/Urinary Tract: Adrenal glands are unremarkable. Multiple low-attenuation lesions of the kidneys, incompletely characterized although likely cysts. Bladder is unremarkable. Stomach/Bowel: Stomach is within normal limits. Appendix appears normal. No evidence of bowel wall thickening, distention, or inflammatory changes. Pancolonic diverticulosis. Moderate burden of stool colon. Vascular/Lymphatic: Tortuous and severely atherosclerotic aorta, measuring up to 3.1 x 2.9 cm in the infrarenal portion of the vessel. No enlarged abdominal or pelvic lymph nodes. Reproductive: No mass or other abnormality. Other: No abdominal wall hernia or abnormality. No abdominopelvic ascites. Musculoskeletal: Levoscoliosis of  the lumbar spine with associated disc degenerative disease. IMPRESSION: 1. Small bilateral pleural effusions with associated atelectasis or consolidation. 2. No definite CT findings of the chest, abdomen, or pelvis to explain fevers. 3. No noncontrast CT evidence of metastatic disease in the chest, abdomen, or pelvis. 4. Redemonstrated infrarenal abdominal aortic aneurysm measuring up to 3.1 cm. Recommend followup by ultrasound in 3 years. This recommendation follows ACR consensus guidelines: White Paper of the ACR Incidental Findings Committee II on Vascular Findings. J Am Coll Radiol 2013; 10:789-794. Aortic aneurysm NOS (ICD10-I71.9) 5.  Coronary artery disease.  Aortic Atherosclerosis (ICD10-I70.0). Electronically Signed   By: Eddie Candle M.D.   On: 01/06/2019 08:16   Ct Chest Wo Contrast  Result Date: 01/06/2019 CLINICAL DATA:  Fever, metastatic melanoma EXAM: CT CHEST, ABDOMEN AND PELVIS WITHOUT CONTRAST TECHNIQUE: Multidetector CT imaging of the chest, abdomen and pelvis was performed following the standard protocol without IV contrast. COMPARISON:  PET-CT, 09/18/2018 FINDINGS: CT CHEST FINDINGS Cardiovascular: Aortic atherosclerosis. Normal heart size. Three-vessel coronary artery calcifications and/or stents status post median sternotomy and CABG. Left chest multi lead pacer. No pericardial effusion. Mediastinum/Nodes: No enlarged mediastinal, hilar, or axillary lymph nodes. Thyroid gland, trachea, and esophagus demonstrate no significant findings. Lungs/Pleura: Small bilateral pleural effusions with associated atelectasis or consolidation. There is a small, stable, calcified benign nodule of the lingula measuring 5 mm (series 6, image 87). Musculoskeletal: No chest wall mass or suspicious bone lesions identified. CT ABDOMEN PELVIS FINDINGS Hepatobiliary: No solid liver abnormality is seen. No gallstones, gallbladder wall thickening, or biliary dilatation. Pancreas: Unremarkable. No pancreatic ductal  dilatation or surrounding inflammatory changes. Spleen: Normal in size without significant abnormality. Adrenals/Urinary Tract: Adrenal glands are unremarkable. Multiple low-attenuation lesions of the kidneys, incompletely characterized although likely cysts. Bladder is unremarkable. Stomach/Bowel: Stomach is within normal limits. Appendix appears normal. No evidence of bowel wall thickening, distention, or inflammatory changes. Pancolonic diverticulosis. Moderate burden of stool colon. Vascular/Lymphatic: Tortuous and severely atherosclerotic aorta, measuring up to 3.1 x 2.9 cm in the infrarenal portion of the vessel. No enlarged abdominal or pelvic lymph nodes. Reproductive: No mass or other abnormality. Other: No abdominal wall hernia or abnormality. No abdominopelvic ascites. Musculoskeletal: Levoscoliosis of  the lumbar spine with associated disc degenerative disease. IMPRESSION: 1. Small bilateral pleural effusions with associated atelectasis or consolidation. 2. No definite CT findings of the chest, abdomen, or pelvis to explain fevers. 3. No noncontrast CT evidence of metastatic disease in the chest, abdomen, or pelvis. 4. Redemonstrated infrarenal abdominal aortic aneurysm measuring up to 3.1 cm. Recommend followup by ultrasound in 3 years. This recommendation follows ACR consensus guidelines: White Paper of the ACR Incidental Findings Committee II on Vascular Findings. J Am Coll Radiol 2013; 10:789-794. Aortic aneurysm NOS (ICD10-I71.9) 5.  Coronary artery disease.  Aortic Atherosclerosis (ICD10-I70.0). Electronically Signed   By: Eddie Candle M.D.   On: 01/06/2019 08:16   Dg Chest Port 1 View  Result Date: 01/05/2019 CLINICAL DATA:  Shortness of breath EXAM: PORTABLE CHEST 1 VIEW COMPARISON:  01/02/2019 FINDINGS: 0359 hours. Asymmetric elevation right hemidiaphragm. The lungs are clear without focal pneumonia, edema, pneumothorax or pleural effusion. The cardiopericardial silhouette is within normal  limits for size. Left permanent pacemaker noted. The visualized bony structures of the thorax are intact. Telemetry leads overlie the chest. IMPRESSION: No acute cardiopulmonary findings. Electronically Signed   By: Misty Stanley M.D.   On: 01/05/2019 08:39    Assessment: 78 y.o. 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  He recently developed pancytopenia, he was hospitalized for neutropenic fever and cellulitis in early September.  With neutropenic fever again.  1.  Neutropenic fever 2. Probable pneumonia  3. Worsening anemia  4.  Newly diagnosed prostate cancer, has not been treated 5.  History of metastatic melanoma to brian, NED now  6.  History of hemorrhagic stroke, from brain metastasis, cognitive dysfunction   Plan:  -Blood and urine cultures remain negative to date. -Labs from today have been reviewed.  The white blood cell count is low at 2.7 with a normal ANC of 2.0.  Hemoglobin and platelets remain low but are stable.  We will hold off on any additional Granix at this time. -Please consider blood transfusion if hemoglobin <7.5 -Daughter has requested that his bone marrow biopsy be performed while he is admitted.  We will try to arrange for this to be done by 1 of our oncology APPs for this coming Wednesday, 01/08/2019.  Mikey Bussing, NP 01/06/2019  12:42 PM  Addendum  I have seen the patient, examined him. I agree with the assessment and and plan and have edited the notes.   He has negative ID work up for his fever, not sure if it's virus related, or his gout flare. He was seen by Dr. Megan Salon today. Hope his bone marrow biopsy will give [place] some information, malignant bone marrow disease such as leukemia and lymphoma can certainly cause fever, although my suspicion is not high.   I have schedule his bone marrow biopsy with our NP Wilber Bihari on Wednesday morning around 7:30-8am. I  explained to pt the purpose of the bone marrow biopsy, to rule out primary marrow disease, such as MDS, and metastatic cancer in his marrow.  I discussed the potential risks, which includes but not limited to, pain, infection, bleeding, nerve damage, etc.  He voiced good understanding, and agrees to proceed.  Written consent obtained today.  Bone marrow biopsy was previously discussed with his [name] who agrees.   I discussed his CT scan which was negative for source of infection or evidence of metastatic disease.  [name]  01/06/2019

## 2019-01-06 NOTE — Progress Notes (Signed)
Physical Therapy Evaluation Patient Details Name: David [name]. MRN: [id number] DOB: 06-07-1940 Today's Date: 01/06/2019   History of Present Illness  David [name], David [name]. is a 78 year old Caucasian male with a past medical history significant for but not limited to history of metastatic malignant melanoma with mets to lungs and brain on immunotherapy, history of prostate cancer, diabetes mellitus type 2, hypothyroidism, history of complete heart block status post pacemaker placement, CAD status post CABG, history of ischemic cardiomyopathy status post ICD and other comorbidities who presented with fevers and chills at home with a temperature of 101.  Family was concerned and brought him to the ER he was found to be neutropenic.  Clinical Impression   Pt presents with generalized weakness RLE weaker than LLE, difficulty performing all mobility tasks, impaired standing balance especially dynamically, and decreased activity tolerance due to weakness and fatigue. Pt to benefit from acute PT to address deficits. Pt required min-max assist +2 for bed mobility, transfers, and stand pivot with small pivotal steps on eval. PT recommending SNF vs HHPT with aide and 24/7 assist, pt reports he and his family are nervous to d/c to SNF given COVID. PT to progress mobility as tolerated, and will continue to follow acutely.      Follow Up Recommendations Supervision for mobility/OOB;SNF(vs home with HHPT and 24/7 assist)    Equipment Recommendations  None recommended by PT    Recommendations for Other Services       Precautions / Restrictions Precautions Precautions: Fall Restrictions Weight Bearing Restrictions: No      Mobility  Bed Mobility Overal bed mobility: Needs Assistance Bed Mobility: Supine to Sit     Supine to sit: Max assist;+2 for safety/equipment     General bed mobility comments: max +2 for supine to sit for trunk elevation, LE lifting and translation to EOB, scooting to  EOB with use of bed pad.  Transfers Overall transfer level: Needs assistance Equipment used: Rolling walker (2 wheeled) Transfers: Sit to/from [facility] Sit to Stand: +2 safety/equipment;Mod assist Stand pivot transfers: +2 safety/equipment;Min assist       General transfer comment: Mod assist for power up, steadying. Verbal cuing for hand placement pushing from support surface when rising. Pt able to take 4 steps forward to get to recliner, requiring steadying assist and pt with lack of eccentric control when lowering into chair.  Ambulation/Gait                Stairs            Wheelchair Mobility    Modified Rankin (Stroke Patients Only)       Balance Overall balance assessment: Needs assistance Sitting-balance support: Bilateral upper extremity supported;Feet supported Sitting balance-Leahy Scale: Fair     Standing balance support: Bilateral upper extremity supported Standing balance-Leahy Scale: Poor                               Pertinent Vitals/Pain Pain Assessment: Faces Faces Pain Scale: Hurts even more Pain Location: both feet Pain Descriptors / Indicators: Grimacing;Tender Pain Intervention(s): Limited activity within patient's tolerance;Monitored during session;Repositioned    Home Living Family/patient expects to be discharged to:: Unsure Living Arrangements: Spouse/significant other   Type of Home: Independent living facility Home Access: Stairs to enter   [employer] of Steps: 2 Home Layout: One level Home Equipment: Grab bars - tub/shower;Walker - 2 wheels;Cane - single point;Grab bars - toilet Additional  Comments: Pt's wife unable to physically assist. Report they live at [facility]     Prior Function Level of Independence: Independent         Comments: retired Nutritional therapist Dominance   Dominant Hand: Right    Extremity/Trunk Assessment   Upper Extremity Assessment Upper  Extremity Assessment: Generalized weakness    Lower Extremity Assessment Lower Extremity Assessment: Generalized weakness(able to peform ankle pumps, LAQ bilaterally but more difficult RLE, seated marching again more difficult RLE)    Cervical / Trunk Assessment Cervical / Trunk Assessment: Kyphotic  Communication   Communication: No difficulties  Cognition Arousal/Alertness: Awake/alert Behavior During Therapy: WFL for tasks assessed/performed Overall Cognitive Status: No family/caregiver present to determine baseline cognitive functioning Area of Impairment: Memory;Safety/judgement;Awareness                               General Comments: Pt alert and oriented X3. Pt demonstrated slowed processing throughout, requires increased time to complete tasks and follow instructions.      General Comments      Exercises General Exercises - Lower Extremity Ankle Circles/Pumps: AROM;Both;10 reps;Seated   Assessment/Plan    PT Assessment Patient needs continued PT services  PT Problem List Decreased strength;Decreased balance;Decreased activity tolerance;Decreased mobility;Decreased knowledge of use of DME;Decreased safety awareness;Decreased cognition;Pain       PT Treatment Interventions Gait training;DME instruction;Stair training;Functional mobility training;Therapeutic exercise;Therapeutic activities;Balance training;Patient/family education    PT Goals (Current goals can be found in the Care Plan section)  Acute Rehab PT Goals Patient Stated Goal: get stronger PT Goal Formulation: With patient Time For Goal Achievement: 01/20/19 Potential to Achieve Goals: Good    Frequency Min 2X/week   Barriers to discharge Decreased caregiver support(wife with Alzheimer's, and unable to physically assist per chart review)      Co-evaluation PT/OT/SLP Co-Evaluation/Treatment: Yes Reason for Co-Treatment: For patient/therapist safety PT goals addressed during session:  Mobility/safety with mobility OT goals addressed during session: ADL's and self-care       AM-PAC PT "6 Clicks" Mobility  Outcome Measure Help needed turning from your back to your side while in a flat bed without using bedrails?: A Lot Help needed moving from lying on your back to sitting on the side of a flat bed without using bedrails?: A Lot Help needed moving to and from a bed to a chair (including a wheelchair)?: A Lot Help needed standing up from a chair using your arms (e.g., wheelchair or bedside chair)?: A Lot Help needed to walk in hospital room?: A Lot Help needed climbing 3-5 steps with a railing? : Total 6 Click Score: 11    End of Session Equipment Utilized During Treatment: Gait belt Activity Tolerance: Patient limited by fatigue Patient left: with call bell/phone within reach;in chair;with chair alarm set Nurse Communication: Mobility status PT Visit Diagnosis: Unsteadiness on feet (R26.81);Muscle weakness (generalized) (M62.81);Pain Pain - Right/Left: Right Pain - part of body: Ankle and joints of foot    Time: 9179-1505 PT Time Calculation (min) (ACUTE ONLY): 21 min   Charges:   PT Evaluation $PT Eval Low Complexity: 1 Low         David [name] Conception Chancy, PT Acute Rehabilitation Services Pager [phone]  Office [phone]   Cortney Beissel D Jyron Turman 01/06/2019, 4:07 PM

## 2019-01-06 NOTE — Telephone Encounter (Signed)
Spoke with Estill Bamberg to schedule BMBX with LCC/NP on Wednesday 01/08/19 at 8 am.  Patient is admitted at this time.

## 2019-01-06 NOTE — Evaluation (Signed)
Occupational Therapy Evaluation Patient Details Name: David [name]. MRN: [id number] DOB: Apr 26, 1940 Today's Date: 01/06/2019    History of Present Illness David [name]. is a 78 year old Caucasian male with a past medical history significant for but not limited to history of metastatic malignant melanoma on immunotherapy, history of prostate cancer, diabetes mellitus type 2, hypothyroidism, history of complete heart block status post pacemaker placement, CAD status post CABG, history of ischemic cardiomyopathy status post ICD and other comorbidities who presented with fevers and chills at home with a temperature of 101.  Family was concerned and brought him to the ER he was found to be neutropenic.   Clinical Impression   Pt admitted with fever. Pt currently with functional limitations due to the deficits listed below (see OT Problem List).  Pt will benefit from skilled OT to increase their safety and independence with ADL and functional mobility for ADL to facilitate discharge to venue listed below.   Pt needed significant A with OT.  Spoke with pt regarding care at home versus SNF.  Pt states he would speak with his daugther.  Pt would need physical A with ADL activity 24/7 if Dc home for safety.       Follow Up Recommendations  SNF;Home health OT;Supervision/Assistance - 24 hour(depending on family or hired A)    Equipment Recommendations  3 in 1 bedside commode    Recommendations for Other Services       Precautions / Restrictions Precautions Precautions: Fall      Mobility Bed Mobility Overal bed mobility: Needs Assistance Bed Mobility: Supine to Sit     Supine to sit: Max assist     General bed mobility comments: heavy use of bed rail and VC for sequencing  Transfers Overall transfer level: Needs assistance Equipment used: Rolling walker (2 wheeled) Transfers: Sit to/from [facility] Sit to Stand: +2 safety/equipment;Mod assist Stand  pivot transfers: +2 safety/equipment;Mod assist       General transfer comment: pt needed significant A with sit to stand from EOB.    Balance Overall balance assessment: Needs assistance Sitting-balance support: Bilateral upper extremity supported;Feet supported Sitting balance-Leahy Scale: Fair     Standing balance support: Bilateral upper extremity supported Standing balance-Leahy Scale: Poor                             ADL either performed or assessed with clinical judgement   ADL Overall ADL's : Needs assistance/impaired Eating/Feeding: Set up;Sitting   Grooming: Set up;Sitting   Upper Body Bathing: Set up;Sitting   Lower Body Bathing: Maximal assistance;Sit to/from stand;Cueing for safety;Cueing for compensatory techniques;Cueing for sequencing   Upper Body Dressing : Set up;Sitting   Lower Body Dressing: Maximal assistance;Sit to/from stand;Cueing for safety;Cueing for compensatory techniques;Cueing for sequencing   Toilet Transfer: Moderate assistance;+2 for physical assistance;+2 for safety/equipment;Cueing for sequencing;Cueing for safety;Stand-pivot;RW Toilet Transfer Details (indicate cue type and reason): bed to chair Toileting- Clothing Manipulation and Hygiene: +2 for safety/equipment;+2 for physical assistance;Maximal assistance;Sit to/from stand         General ADL Comments: Pt will need significant A with ADL activity at home.  Pt would need 24/7 A with ADL activity if DC home.  If 24/7 A not available pt would need SNF     Vision Baseline Vision/History: Wears glasses Wears Glasses: Reading only Patient Visual Report: No change from baseline  Pertinent Vitals/Pain Pain Assessment: Faces Faces Pain Scale: Hurts even more Pain Location: both feet Pain Descriptors / Indicators: Grimacing Pain Intervention(s): Limited activity within patient's tolerance;Monitored during session        Extremity/Trunk Assessment          Cervical / Trunk Assessment Cervical / Trunk Assessment: Kyphotic   Communication Communication Communication: No difficulties   Cognition Arousal/Alertness: Awake/alert Behavior During Therapy: WFL for tasks assessed/performed Overall Cognitive Status: No family/caregiver present to determine baseline cognitive functioning Area of Impairment: Memory;Safety/judgement;Awareness                               General Comments: Pt alert and oriented X3. Pt demonstrated slowed processing throughout.   General Comments               Home Living Family/patient expects to be discharged to:: Unsure Living Arrangements: Spouse/significant other   Type of Home: Independent living facility       Home Layout: One level     Bathroom Shower/Tub: Occupational psychologist: Handicapped height Bathroom Accessibility: Yes How Accessible: Accessible via walker Home Equipment: None   Additional Comments: Pt's wife unable to physically assist. Report they live at [name]       Prior Functioning/Environment Level of Independence: Independent        Comments: retired Pharmacist, community        OT Problem List: Decreased strength;Decreased activity tolerance;Impaired balance (sitting and/or standing);Decreased knowledge of use of DME or AE      OT Treatment/Interventions: Self-care/ADL training;Patient/family education;DME and/or AE instruction    OT Goals(Current goals can be found in the care plan section)    OT Frequency: Min 2X/week    AM-PAC OT "6 Clicks" Daily Activity     Outcome Measure Help from another person eating meals?: None Help from another person taking care of personal grooming?: A Little Help from another person toileting, which includes using toliet, bedpan, or urinal?: A Lot Help from another person bathing (including washing, rinsing, drying)?: A Lot Help from another person to put on and taking off regular upper body clothing?: A  Little Help from another person to put on and taking off regular lower body clothing?: A Lot 6 Click Score: 16   End of Session Equipment Utilized During Treatment: Gait belt;Rolling walker Nurse Communication: Mobility status  Activity Tolerance: Patient tolerated treatment well Patient left: in chair;with call bell/phone within reach;with chair alarm set  OT Visit Diagnosis: Unsteadiness on feet (R26.81);Muscle weakness (generalized) (M62.81)                Time: 5631-4970 OT Time Calculation (min): 23 min Charges:  OT General Charges $OT Visit: 1 Visit OT Evaluation $OT Eval Moderate Complexity: 1 Mod  Kari Baars, [place] [phone] Office- [phone]     Caress Reffitt, Edwena Felty D 01/06/2019, 3:00 PM

## 2019-01-06 NOTE — TOC Initial Note (Signed)
Transition of Care (TOC) - Initial/Assessment Note    Patient Details  Name: David [name]. MRN: [id number] Date of Birth: Feb 10, 1941  Transition of Care [facility]) CM/SW Contact:    Nila Nephew, LCSW Phone Number: [phone] 01/06/2019, 2:05 PM  Clinical Narrative:     Pt admitted with neutropenic fever from home ([place]) where he resides with his wife. Pt's wife has Alzheimer's; pt/wife have home caregiving aides, and pt was recently set up with home health PT/RN/SW following DC home from CIR on 12/28/18 (agency [facility]). Spoke with pt's daughter today re: DC care needs. Daughter states that she is concerned about any possible lag in home care follow up once pt is discharged. CSW will communicate with [facility] agency to know earliest possible visit once pt is discharged.  Daughter planning to have home aide hours increased in the first week at least that pt returns home to help with supervision and monitoring.  Daughter also notes that if CIR recommended again this would be agreeable, would not feel comfortable with SNF however given pandemic.  TOC team will follow.             Expected Discharge Plan: (TBD, anticipate home with home health) Barriers to Discharge: Continued Medical Work up   Patient Goals and CMS Choice Patient states their goals for this hospitalization and ongoing recovery are:: home health or CIR   Choice offered to / list presented to : Adult Children  Expected Discharge Plan and Services Expected Discharge Plan: (TBD, anticipate home with home health) In-house Referral: Clinical Social Work Discharge Planning Services: CM Consult     Expected Discharge Date: (unknown)                           [place] Agency: [place] ([facility]) Date [place]: 01/06/19 Time [place]: [place] Representative spoke with at [place]: [place]  Prior Living Arrangements/Services       Do you feel safe  going back to the place where you live?: Yes          Current home services: Home PT, Home RN, Homehealth aide, DME, Other (comment)(HH SW)    Activities of Daily Living Home Assistive Devices/Equipment: Grab bars in shower, Hand-held shower hose, Grab bars around toilet, Environmental consultant (specify type), Eyeglasses, Other (Comment), CBG Meter(front wheeled walker, walk-in-shower) ADL Screening (condition at time of admission) Patient's cognitive ability adequate to safely complete daily activities?: Yes Is the patient deaf or have difficulty hearing?: No Does the patient have difficulty seeing, even when wearing glasses/contacts?: No Does the patient have difficulty concentrating, remembering, or making decisions?: Yes Patient able to express need for assistance with ADLs?: Yes Does the patient have difficulty dressing or bathing?: Yes Independently performs ADLs?: No Communication: Independent Dressing (OT): Dependent Is this a change from baseline?: Change from baseline, expected to last >3 days Grooming: Dependent Is this a change from baseline?: Change from baseline, expected to last >3 days Feeding: Dependent Is this a change from baseline?: Change from baseline, expected to last >3 days Bathing: Dependent Is this a change from baseline?: Change from baseline, expected to last >3 days Toileting: Dependent Is this a change from baseline?: Change from baseline, expected to last >3days In/Out Bed: Dependent Is this a change from baseline?: Change from baseline, expected to last >3 days Walks in Home: Dependent Is this a change from baseline?: Change from baseline, expected to last >3  days Does the patient have difficulty walking or climbing stairs?: Yes(secondary to weakness) Weakness of Legs: Both Weakness of Arms/Hands: Both  Permission Sought/Granted Permission sought to share information with : Family Supports [name]                 Emotional Assessment               Admission diagnosis:  Neutropenic fever ([place]) [D70.9, R50.81] Patient Active Problem List   Diagnosis Date Noted  . Pancytopenia ([place]) 01/06/2019  . Gout 01/06/2019  . Recurrent fever 01/03/2019  . Steroid-induced hyperglycemia   . Essential hypertension   . Controlled type 2 diabetes mellitus with hyperglycemia, without long-term current use of insulin ([place])   . Acute gout due to renal impairment involving multiple sites   . General weakness 12/18/2018  . Neutropenia ([place]) 12/09/2018  . Thrombocytopenia ([place]) 12/09/2018  . Insomnia 10/14/2018  . Heart block AV complete ([place]) 10/01/2018  . Presence of biventricular cardiac pacemaker 10/01/2018  . Hypogonadism in male 04/18/2018  . Dandruff in adult 03/27/2018  . Dyslipidemia 03/27/2018  . Acute pain of right shoulder 03/11/2018  . Prostate cancer ([place]) 12/24/2017  . Hx of CABG 07/17/2017  . Alcohol use 04/19/2017  . Cognitive and neurobehavioral dysfunction following brain injury ([place]) 02/25/2017  . History of implantation of joint prosthesis of elbow 02/12/2017  . DJD (degenerative joint disease) 02/12/2017  . Nontraumatic cortical hemorrhage of left cerebral hemisphere ([place]) 06/27/2016  . Hematochezia 05/06/2016  . Advanced care planning/counseling discussion 05/03/2016  . Atherosclerosis of native coronary artery of native heart with stable angina pectoris ([place]) 04/24/2016  . Ischemic cardiomyopathy 04/24/2016  . Acquired hypothyroidism 11/24/2015  . Metastatic melanoma to lung ([place])   . MDD (major depressive disorder), recurrent episode, moderate ([place])   . Benign prostatic hyperplasia   . Metastasis to brain ([place]) 10/15/2015  . Malignant melanoma ([place]) 09/22/2015  . Lung mass   . Chronic kidney disease, stage III (moderate) (HCC)   . Overweight with body mass index (BMI) 25.0-29.9 08/26/2015  . Incontinence 08/26/2015  . Hemorrhagic stroke ([place]) 08/26/2015  . Hemiplegia and hemiparesis following nontraumatic  intracerebral hemorrhage affecting right dominant side ([place]) 08/26/2015  . Aphasia following nontraumatic intracerebral hemorrhage 08/26/2015  . Gait disturbance, post-stroke 08/26/2015  . Benign essential HTN   . OSA on CPAP   . Biventricular ICD (implantable cardioverter-defibrillator) in place   . ICH (intracerebral hemorrhage) ([place]) - L frontal due to HTN vs CAA 08/22/2015   PCP:  [name], MD Pharmacy:   [place], [place] - [place] [address] [place] [address] Phone: [phone] Fax: [phone]     Social Determinants of Health (SDOH) Interventions    Readmission Risk Interventions Readmission Risk Prevention Plan 12/18/2018  Transportation Screening Complete  Medication Review ([place]) Complete  PCP or Specialist appointment within 3-5 days of discharge Not Complete  PCP/Specialist Appt Not Complete comments still arranging disposition  [place] or [place] Complete  SW Recovery Care/Counseling Consult Complete  Palliative Care Screening Not [place] Patient Refused  Some recent data might be hidden

## 2019-01-06 NOTE — Consult Note (Signed)
[name]ious [facility]    Date of Admission:  01/02/2019   Total days of antibiotics 4        Day 2 meropenem        Day 2 doxycycline              Reason for Consult: Recurrent fever    Referring Provider: Dr. Raiford Noble  Assessment: The exact cause of his recurrent fevers remains uncertain but I do not find clear evidence of active infection.  I wonder if he could have a mild flare of his gout in his left ankle contributing to his fever.  I will see him again in the morning and consider stopping empiric antibiotics.  I suspect that whatever is causing his pancytopenia might also be causing his fever.  It appears that he is going to have a bone marrow biopsy this week.  I discussed my assessment and plans with his daughter, Dr. Silas [place], this afternoon.  Plan: 1. Consider stopping empiric antibiotics soon 2. I will follow with you  Principal Problem:   Recurrent fever Active Problems:   Pancytopenia ([name])   Malignant melanoma ([name])   Metastasis to brain [facility])   Metastatic melanoma to lung ([name])   Prostate cancer ([name])   ICH (intracerebral hemorrhage) ([name]) - L frontal due to HTN vs CAA   OSA on CPAP   Biventricular ICD (implantable cardioverter-defibrillator) in place   Chronic kidney disease, stage III (moderate) ([name])   Acquired hypothyroidism   Ischemic cardiomyopathy   DJD (degenerative joint disease)   Hx of CABG   Heart block AV complete ([name])   Controlled type 2 diabetes mellitus with hyperglycemia, without long-term current use of insulin ([name])   Gout   Scheduled Meds: . sodium chloride   Intravenous Once  . aspirin EC  81 mg Oral QPM  . citalopram  10 mg Oral Daily  . enoxaparin (LOVENOX) injection  40 mg Subcutaneous Q24H  . gabapentin  100 mg Oral BID  . guaiFENesin  1,200 mg Oral BID  . insulin aspart  0-9 Units Subcutaneous TID WC  . levothyroxine  137 mcg Oral Q0600  . methylphenidate  5 mg Oral BID WC  . polyethylene  glycol  17 g Oral BID  . predniSONE  5 mg Oral Q breakfast  . rosuvastatin  20 mg Oral QHS  . senna-docusate  1 tablet Oral BID   Continuous Infusions: . doxycycline (VIBRAMYCIN) IV 100 mg (01/06/19 0831)  . meropenem (MERREM) IV 1 g (01/06/19 1102)  . sodium bicarbonate 150 mEq in dextrose 5% 1000 mL 150 mEq (01/06/19 1150)   PRN Meds:.acetaminophen **OR** acetaminophen, levalbuterol, ondansetron **OR** ondansetron (ZOFRAN) IV, oxyCODONE, traZODone  HPI: David [name]. is a 78 y.o. male with a complicated medical history.  He has been treated for metastatic malignant melanoma that appears to be in remission.  He was also recently diagnosed with prostate cancer but has not undergone therapy.  Over the past several months he has developed pancytopenia.  He was admitted to the hospital on 12/08/2018 with fever neutropenia.  There was concern for possible UTI but his urine culture was negative.  He was treated for right foot cellulitis.  Since that time he has had other readmission with fever.  All cultures and chest x-ray were negative.  He was started on a steroid taper for a flare of gout which was a new diagnosis.  He was readmitted  01/03/2019 with recurrent fever and chills.  He was started on empiric antibiotic therapy.  Blood cultures are negative again.  No acute abnormalities were found on CT scan of his chest, abdomen or pelvis.   Review of Systems: Review of Systems  Unable to perform ROS: Mental acuity    Past Medical History:  Diagnosis Date  . Anginal pain ([place])   . Arthritis    mostly hands  . Benign familial hematuria   . BPH (benign prostatic hypertrophy)   . Brain cancer ([name])    melanoma with brain met  . Coronary artery disease   . GERD (gastroesophageal reflux disease)   . High cholesterol   . Hypertension   . Hypothyroidism   . Intracerebral hemorrhage ([place])   . Melanoma ([facility] [name])   . Metastatic melanoma to lung (David [name])   . Mood disorder ([place])   . Myocardial  infarction ([place])   . Pacemaker    due to syncope, 3rd degree HB (upgrade to [facility]. Jude CRT-P 02/25/13 (Dr. Uvaldo [place])  . Rectal bleed    due to NSAIDS  . Renal disorder   . Sepsis ([place]) 12/09/2018  . Skin cancer    melanoma  . Sleep apnea    uses CPAP  . Stroke [name])     Social History   Tobacco Use  . Smoking status: Former Smoker    Packs/day: 1.00    Years: 3.00    Pack years: 3.00    Quit date: 04/10/1957    Years since quitting: 61.7  . Smokeless tobacco: Never Used  Substance Use Topics  . Alcohol use: Yes    Alcohol/week: 14.0 standard drinks    Types: 10 Glasses of wine, 4 Cans of beer per week  . Drug use: No    Family History  Problem Relation Age of Onset  . Cancer Mother 36       lung   . Hypertension Mother   . Hypertension Father   . Heart attack Father   . Heart disease Father   . Rheum arthritis Sister   . Cancer Maternal Grandmother    Allergies  Allergen Reactions  . Ezetimibe Other (See Comments)    Unknown reaction  . Simvastatin Other (See Comments)    Reaction:  Gave pt a fever  Fever - temp of 103.   In 2003    OBJECTIVE: Blood pressure 123/77, pulse 88, temperature 99 F (37.2 C), temperature source Oral, resp. rate 18, height 5' 9.5" (1.765 m), weight 90.7 kg, SpO2 99 %.  Physical Exam Constitutional:      Comments: He is resting quietly in bed watching television.  He appears confused and asked me on several occasions to "call [name]" (his daughter).  HENT:     Mouth/Throat:     Pharynx: No oropharyngeal exudate.  Eyes:     Conjunctiva/sclera: Conjunctivae normal.  Cardiovascular:     Rate and Rhythm: Normal rate and regular rhythm.     Heart sounds: No murmur.  Pulmonary:     Effort: Pulmonary effort is normal.     Breath sounds: Normal breath sounds.  Chest:    Abdominal:     Palpations: Abdomen is soft.     Tenderness: There is no abdominal tenderness.  Musculoskeletal:        General: Swelling and tenderness present.      Comments: He has diffuse synovial thickening in his fingers and hands bilaterally.  His left medial malleolus is erythematous very tender to touch.  He has pain with range of motion.  He says that he was unaware of this pain until I examined him.     Lab Results Lab Results  Component Value Date   WBC 2.7 (L) 01/06/2019   HGB 9.5 (L) 01/06/2019   HCT 29.1 (L) 01/06/2019   MCV 100.3 (H) 01/06/2019   PLT 139 (L) 01/06/2019    Lab Results  Component Value Date   CREATININE 1.30 (H) 01/06/2019   BUN 26 (H) 01/06/2019   NA 134 (L) 01/06/2019   K 4.2 01/06/2019   CL 103 01/06/2019   CO2 18 (L) 01/06/2019    Lab Results  Component Value Date   ALT 18 01/06/2019   AST 13 (L) 01/06/2019   ALKPHOS 34 (L) 01/06/2019   BILITOT 0.8 01/06/2019     Microbiology: Recent Results (from the past 240 hour(s))  Blood Culture (routine x 2)     Status: None (Preliminary result)   Collection Time: 01/02/19 10:49 PM   Specimen: BLOOD  Result Value Ref Range Status   Specimen Description   Final    BLOOD RIGHT ANTECUBITAL Performed at [facility], [place] [address], [place], [place] [address]    Special Requests   Final    BOTTLES DRAWN AEROBIC AND ANAEROBIC Blood Culture adequate volume Performed at [place] [address], [place], [place] [address]    Culture   Final    NO GROWTH 3 DAYS Performed at [facility], [place] [address] [place], [place] [address]    Report Status PENDING  Incomplete  Blood Culture (routine x 2)     Status: None (Preliminary result)   Collection Time: 01/02/19 10:49 PM   Specimen: BLOOD  Result Value Ref Range Status   Specimen Description   Final    BLOOD LEFT ANTECUBITAL Performed at [place] [address] [place], [place] [address]    Special Requests   Final    BOTTLES DRAWN AEROBIC AND ANAEROBIC Blood Culture adequate volume Performed at [place]  [address], [place], [place] [address]    Culture   Final    NO GROWTH 3 DAYS Performed at [facility], [place] [address], [place], [place] [address]    Report Status PENDING  Incomplete  Urine culture     Status: None   Collection Time: 01/03/19 12:46 AM   Specimen: In/Out Cath Urine  Result Value Ref Range Status   Specimen Description   Final    IN/OUT CATH URINE Performed at [place] [address] [place], [place] [address]    Special Requests   Final    NONE Performed at [facility], [place] [address] [place],  [address]    Culture   Final    NO GROWTH Performed at [facility], [place] [address], [place],  [address]    Report Status 01/04/2019 FINAL  Final  SARS Coronavirus 2 [facility] order, Performed in [facility] hospital lab) Nasopharyngeal Nasopharyngeal Swab     Status: None   Collection Time: 01/03/19 12:46 AM   Specimen: Nasopharyngeal Swab  Result Value Ref Range Status   SARS Coronavirus 2 NEGATIVE NEGATIVE Final    Comment: (NOTE) If result is NEGATIVE SARS-CoV-2 target nucleic acids are NOT DETECTED. The SARS-CoV-2 RNA is generally detectable in upper and lower  respiratory specimens during the acute phase of infection. The lowest  concentration of  SARS-CoV-2 viral copies this assay can detect is 250  copies / mL. A negative result does not preclude SARS-CoV-2 infection  and should not be used as the sole basis for treatment or other  patient management decisions.  A negative result may occur with  improper specimen collection / handling, submission of specimen other  than nasopharyngeal swab, presence of viral mutation(s) within the  areas targeted by this assay, and inadequate number of viral copies  (<250 copies / mL). A negative result must be combined with clinical  observations, patient history, and epidemiological information. If result is POSITIVE SARS-CoV-2 target nucleic acids are  DETECTED. The SARS-CoV-2 RNA is generally detectable in upper and lower  respiratory specimens dur ing the acute phase of infection.  Positive  results are indicative of active infection with SARS-CoV-2.  Clinical  correlation with patient history and other diagnostic information is  necessary to determine patient infection status.  Positive results do  not rule out bacterial infection or co-infection with other viruses. If result is PRESUMPTIVE POSTIVE SARS-CoV-2 nucleic acids MAY BE PRESENT.   A presumptive positive result was obtained on the submitted specimen  and confirmed on repeat testing.  While 2019 novel coronavirus  (SARS-CoV-2) nucleic acids may be present in the submitted sample  additional confirmatory testing may be necessary for epidemiological  and / or clinical management purposes  to differentiate between  SARS-CoV-2 and other Sarbecovirus currently known to infect humans.  If clinically indicated additional testing with an alternate test  methodology [id number]) is advised. The SARS-CoV-2 RNA is generally  detectable in upper and lower respiratory sp ecimens during the acute  phase of infection. The expected result is Negative. Fact Sheet for Patients:  StrictlyIdeas.no Fact Sheet for Healthcare Providers: BankingDealers.co.za This test is not yet approved or cleared by the [place] FDA and has been authorized for detection and/or diagnosis of SARS-CoV-2 by FDA under an Emergency Use Authorization (EUA).  This EUA will remain in effect (meaning this test can be used) for the duration of the COVID-19 declaration under Section 564(b)(1) of the Act, 21 U.S.C. section 360bbb-3(b)(1), unless the authorization is terminated or revoked sooner. Performed at [facility], [place] [address] [place], [place] [address]   MRSA PCR Screening     Status: None   Collection Time: 01/04/19  4:59 PM   Specimen:  Nasal Mucosa; Nasopharyngeal  Result Value Ref Range Status   MRSA by PCR NEGATIVE NEGATIVE Final    Comment:        The GeneXpert MRSA Assay (FDA approved for NASAL specimens only), is one component of a comprehensive MRSA colonization surveillance program. It is not intended to diagnose MRSA infection nor to guide or monitor treatment for MRSA infections. Performed at [facility], [place] [address] [place], [place] [address]   Fungus culture, blood     Status: None (Preliminary result)   Collection Time: 01/05/19  2:29 PM   Specimen: BLOOD  Result Value Ref Range Status   Specimen Description   Final    BLOOD BLOOD RIGHT HAND Performed at [place] [address], [place], [place] [address]    Special Requests   Final    BOTTLES DRAWN AEROBIC ONLY Blood Culture adequate volume Performed at [facility] [address], [place], [place] [address]    Culture   Final    NO GROWTH < 24 HOURS Performed at [place] [address]  [place], [place] [address]    Report Status PENDING  Incomplete    Michel Bickers, MD [facility] [phone] pager   [phone] cell 01/06/2019, 1:45 PM

## 2019-01-06 NOTE — Progress Notes (Signed)
PROGRESS NOTE    David [name].  DTO:[id number] DOB: July 21, 1940 DOA: 01/02/2019 PCP: [name], MD   Brief Narrative:  David [name], David [name]. is a 78 year old Caucasian male with a past medical history significant for but not limited to history of metastatic malignant melanoma on immunotherapy, history of prostate cancer, diabetes mellitus type 2, hypothyroidism, history of complete heart block status post pacemaker placement, CAD status post CABG, history of ischemic cardiomyopathy status post ICD and other comorbidities who presented with fevers and chills at home with a temperature of 101.  Family was concerned and brought him to the ER he was found to be neutropenic.  Chest x-ray showed a likely pneumonia and he is admitted for further work-up.  Patient's blood pressure remains on the lower side.    In the ED he was given 3 L of normal saline boluses and started on maintenance IV fluid hydration, stress dose steroids, as well as being placed on IV cefepime and IV azithromycin.  IV vancomycin was also added given his hypotension and because of his febrile neutropenia.  Medical oncology Dr. Burr Medico was consulted for further evaluation and giving Granix.  Patient was transfused 1 unit of PRBCs.  He continues to spike temperatures and ANC is still relatively low.  If he continues to spike temperatures and is not improving clinically by tomorrow we will change Cefepime to IV meropenem and in the interim we will obtain fungal cultures.  Patient also had a flu swab done which was negative and fungal blood culture is pending but showed NGTD <24 Hours.  Infectious diseases was consulted at family request as well as palliative care medicine and Dr. Megan Salon evaluated today and feels that he may have a mild flare of his gout in his left ankle contributing to his fever and is considering stopping empiric antibiotics as his [place] is improved currently we do not have an exact cause of why he remains  intermittently febrile.  Palliative Care to see the patient for goals of care discussion and PT OT recommending skilled nursing facility.  Assessment & Plan:   Principal Problem:   Recurrent fever Active Problems:   ICH (intracerebral hemorrhage) (HCC) - L frontal due to HTN vs CAA   OSA on CPAP   Biventricular ICD (implantable cardioverter-defibrillator) in place   Chronic kidney disease, stage III (moderate) (HCC)   Malignant melanoma (HCC)   Metastasis to brain [facility])   Metastatic melanoma to lung [facility])   Acquired hypothyroidism   Ischemic cardiomyopathy   DJD (degenerative joint disease)   Hx of CABG   Prostate cancer (HCC)   Heart block AV complete ([place])   Controlled type 2 diabetes mellitus with hyperglycemia, without long-term current use of insulin (HCC)   Pancytopenia (HCC)   Gout  Febrile Neutropenia -Given that possibility of pneumonia patient is on empiric antibiotics with Cefepime and Zithromax. Follow cultures. Added vancomycin since patient's blood pressure is in the low normal and he is continuing to Spike Temperatures but will stop now given Negative MRSA PCR -Consulted patient's oncologist Dr. Burr Medico who recommended Granix and will continue Broad Spectrum Abx -Since patient was on prednisone will give stress dose steroids and resume home Prednisone and this was continued -Blood Cx x2 pending and Showed No Growth to Date at 3 Days -Urinalysis appeared relatively stable and had moderate blood but no other remarkable findings -TMax today was 101.1 WBC is 2.7 and ANC is 2000 -Received Granix and improved and oncology is holding off further  Granix administration -Obtain fungal cultures and check MRSA PCR; MRSA PCR was negative so we will stop IV vancomycin -Repeat chest x-ray showed no acute cardiopulmonary findings -Continue antibiotics as above and supportive care -Continue neutropenic precautions -Follow cultures and temperature curve as well as WBC -Discussed with  the patient's daughter at length and because this is the patient's third hospitalization a few weeks she requested a palliative care consult with Dr. Hilma Favors for further goals of care discussion given that he is never had one -Medical Oncology arranging for a bone marrow biopsy and will likely need be done in the near future and they are arranging to do it on Wednesday -Infectious diseases was consulted and at the daughter's request that she ran into Dr. Graylon Good of infectious disease in her neighborhood; I called Dr. Megan Salon who evaluated the patient today and recommends considering stopping antibiotics soon as he feels that the fever may be related to possible gout but he does not have a clear etiology of the fevers -A CT of the chest abdomen pelvis was done without contrast and showed "Small bilateral pleural effusions with associated atelectasis or consolidation.. No definite CT findings of the chest, abdomen, or pelvis to explain fevers. No noncontrast CT evidence of metastatic disease in the chest, abdomen, or pelvis. Redemonstrated infrarenal abdominal aortic aneurysm measuring up to 3.1 cm. Recommend followup by ultrasound in 3 years." -I spoke with Dr. Tommy Medal yesterday via telephone who recommended stopping azithromycin and started doxycycline.  Patient is on doxycycline and IV meropenem and still spiking temperatures -Infectious Diseases may feel this may be related to gout flare -Will also order LE Duplex to r/o DVT **Daughter stated last time patient had a drug fever related to his statin and currently he has been getting rosuvastatin so we will hold his rosuvastatin for now  History of Hypertension  -We will hold antihypertensives due to low normal blood pressure.  -We will give stress dose steroids and resumed his home prednisone dosing afterwards -Blood pressure is 119/66  History of CAD status post CABG  -Denies any chest pain. On statins.  -Will hold metoprolol due to low normal  blood pressure.  Chronic Steroids?; Daughter states he was started on it for Gout Flare last time -We will keep patient on prednisone but also give stress dose steroids on admission; Increase Prednisone 40 mg for Suspected Gout Flare  Hypothyroidism  -Checked TSH on admission was 2.54 and worsened to 5.056 -T4 went from 1.01 is now 1.55 -In the setting of patient being sick so we will repeat TFTs in 4 to 6 weeks after he is improved -On Synthroid 137 mcg p.o. daily and will continue  Diabetes Mellitus Type 2  -We will keep patient on sliding scale coverage -CBGs have been ranging from 90-259 -Continue to monitor and adjust insulin regimen as necessary  History of ischemic cardiomyopathy status post ICD  -Presently appears compensated. -We will currently fluid restricted 1200 mL -IV fluids have now been stopped -strict I's and O's and daily weights and continue to monitor for signs and symptoms of volume overload -Patient is +3.053 L since admission  Metastatic Malignant Melanoma  -being followed by Dr. Burr Medico and have consulted her for further recc's -Given Granix one-time dose and has improved his [place] -Check LE to r/o DVT  History of Prostate Cancer. -Sees Dr. Burr Medico  Chronic kidney disease stage III  Metabolic acidosis -Creatinine appears to be close to baseline and BUN/creatinine is now improved from yesterday at 66/1.58 ->  52/1.31 -> 31/1.17 -> 26/1.30 -Resumed IV fluid hydration with sodium bicarbonate 150 mEq at 75 mL -Patient's CO2 was 18, anion gap was 13, and chloride was 130 -Avoid nephrotoxic medications, contrast dyes as well as hypotension if possible -Repeat CMP in a.m.  History of Complete Heart Block  -Status post pacemaker placement. -C/w Telemetry  Pancytopenia -WBC on Admit was 1000, hemoglobin/hematocrit was 7.3/22.9, and Platelet count was 123,000 -Discussed with Dr. Burr Medico and she started the patient on Granix -We will type and screen transfuse 1  unit of PRBCs and after blood transfusion hemoglobin/hematocrit is now 9.5/29.1 -Patient's WBC is now 2.7 and platelet count is 139,000 and ANC is 2000 -Continue broad-spectrum antibiotic treatment as above and repeat CBC in a.m. -Further recommendations appreciated from Hematology  Constipation -Bowel regimen was started with MiraLAX 17 g twice daily, senna docusate -Continue monitor  Hypophosphatemia -Patient's phosphorus level this morning was 1.8 and improved to 2.7 -Continue to monitor and replete as necessary -Repeat phosphorus level in a.m.  AAA -CT scan showed "redemonstrated infrarenal abdominal aortic aneurysm measuring up to 3.1 cm." -Recommend followup by ultrasound in 3 years  Hyponatremia -Patient sodium is 134 today -IV fluids as above -We will continue to monitor and trend and repeat CMP in a.m.  ? Gout Flare -Received stress dose steroids in the ED and now back on prednisone 5 mg daily -Last Uric Acid Level was 8.0 on 01/02/2019 -Increase Prednisone to 40 mg po Daily  -Possibly related to left ankle so will get an X-Ray -Check Uric Acid Level  -We will start colchicine for now  DVT prophylaxis: SCDs Code Status: Full Code Family Communication: No family present at bedside updated daughter over the telephone Disposition Plan: Remain Inpatient for continued workup and treatment and PT/OT recommending SNF  Consultants:   Medical Hematology  Palliative Care Medicine  Infectious Diseases   Procedures: None   Antimicrobials: Anti-infectives (From admission, onward)   Start     Dose/Rate Route Frequency Ordered Stop   01/06/19 0800  vancomycin (VANCOCIN) 1,500 mg in sodium chloride 0.9 % 500 mL IVPB  Status:  Discontinued     1,500 mg 250 mL/hr over 120 Minutes Intravenous Every 24 hours 01/05/19 1618 01/05/19 1710   01/05/19 2000  doxycycline (VIBRAMYCIN) 100 mg in sodium chloride 0.9 % 250 mL IVPB     100 mg 125 mL/hr over 120 Minutes Intravenous  Every 12 hours 01/05/19 1825     01/05/19 1800  meropenem (MERREM) 1 g in sodium chloride 0.9 % 100 mL IVPB     1 g 200 mL/hr over 30 Minutes Intravenous Every 8 hours 01/05/19 0907     01/05/19 0930  meropenem (MERREM) 1 g in sodium chloride 0.9 % 100 mL IVPB     1 g 200 mL/hr over 30 Minutes Intravenous STAT 01/05/19 0906 01/05/19 0951   01/04/19 1200  vancomycin (VANCOCIN) 1,250 mg in sodium chloride 0.9 % 250 mL IVPB  Status:  Discontinued     1,250 mg 166.7 mL/hr over 90 Minutes Intravenous Every 24 hours 01/03/19 0657 01/05/19 1618   01/03/19 0700  vancomycin (VANCOCIN) 2,000 mg in sodium chloride 0.9 % 500 mL IVPB     2,000 mg 250 mL/hr over 120 Minutes Intravenous  Once 01/03/19 0657 01/03/19 1350   01/03/19 0600  azithromycin (ZITHROMAX) 500 mg in sodium chloride 0.9 % 250 mL IVPB  Status:  Discontinued     500 mg 250 mL/hr over 60 Minutes Intravenous Every 24  hours 01/03/19 0521 01/05/19 1825   01/03/19 0600  ceFEPIme (MAXIPIME) 2 g in sodium chloride 0.9 % 100 mL IVPB  Status:  Discontinued     2 g 200 mL/hr over 30 Minutes Intravenous Every 12 hours 01/03/19 0529 01/05/19 0808   01/02/19 2230  piperacillin-tazobactam (ZOSYN) IVPB 3.375 g     3.375 g 100 mL/hr over 30 Minutes Intravenous  Once 01/02/19 2220 01/03/19 0136     Subjective: Seen and examined this morning doing okay but was confused however later on this afternoon spiked another temperature 101.1.  Infectious disease recommending considering stopping empiric antibiotics soon and Dr. Morey Hummingbird arranging for bone marrow biopsy to be done on Wednesday.  PT OT recommending skilled nursing facility so we have reached out to social work for assistance in placement.  Patient not complaining of anything but remains a little confused today.  Objective: Vitals:   01/05/19 2057 01/06/19 0501 01/06/19 1352 01/06/19 1414  BP: 136/74 123/77 119/66   Pulse: 83 88 99   Resp: '19 18 20   '$ Temp: 99.2 F (37.3 C) 99 F (37.2 C) 99.9  F (37.7 C) (!) 101.1 F (38.4 C)  TempSrc: Oral Oral Oral Oral  SpO2: 96% 99% 98%   Weight:      Height:        Intake/Output Summary (Last 24 hours) at 01/06/2019 1549 Last data filed at 01/06/2019 1400 Gross per 24 hour  Intake 2697 ml  Output 3650 ml  Net -953 ml   Filed Weights   01/03/19 0437  Weight: 90.7 kg   Examination: Physical Exam:  Constitutional: Elderly Caucasian male currently no acute distress appears calm but he still confused today Eyes: Lids and conjunctivae normal, sclerae anicteric  ENMT: External Ears, Nose appear normal. Grossly normal hearing. Mucous membranes are moist.  Neck: Appears normal, supple, no cervical masses, normal ROM, no appreciable thyromegaly; no JVD Respiratory: Diminished to auscultation bilaterally, no wheezing, rales, rhonchi or crackles. Normal respiratory effort and patient is not tachypenic. No accessory muscle use. Unlabored breathing  Cardiovascular: RRR, no murmurs / rubs / gallops. S1 and S2 auscultated.  Has some mild lower extremity edema with left leg slightly larger than right and he has some left ankle swelling. Abdomen: Soft, non-tender, non-distended. No masses palpated. No appreciable hepatosplenomegaly. Bowel sounds positive x4.  GU: Deferred. Musculoskeletal: No clubbing / cyanosis of digits/nails. No joint deformity upper and lower extremities appreciated on a limited evaluation Skin: No rashes, lesions, ulcers noted but does have some marks from prior cellulitis. No induration; Warm and dry.  Neurologic: CN 2-12 grossly intact with no focal deficits. Romberg sign and cerebellar reflexes not assessed.  Psychiatric: Slightly impaired judgment and insight. Alert and awake.  Somewhat anxious mood and appropriate affect.   Data Reviewed: I have personally reviewed following labs and imaging studies  CBC: Recent Labs  Lab 01/02/19 2219 01/03/19 0559 01/04/19 0539 01/05/19 0521 01/06/19 0542  WBC 0.8* 1.0* 0.7*  2.9* 2.7*  NEUTROABS 0.3* 0.6* 0.4* 2.5 2.0  HGB 8.1* 7.3* 8.2* 9.6* 9.5*  HCT 24.0* 22.9* 24.6* 28.9* 29.1*  MCV 99.6 101.8* 98.4 98.6 100.3*  PLT 155 123* 121* 132* 620*   Basic Metabolic Panel: Recent Labs  Lab 01/02/19 2219 01/03/19 0559 01/04/19 0539 01/05/19 0521 01/06/19 0542  NA 133* 135 136 138 134*  K 4.6 4.0 3.9 4.2 4.2  CL 104 110 109 106 103  CO2 19* 16* 19* 23 18*  GLUCOSE 81 114* 101* 87  100*  BUN 78* 68* 52* 31* 26*  CREATININE 1.66* 1.58* 1.31* 1.17 1.30*  CALCIUM 8.1* 7.5* 7.4* 7.9* 7.6*  MG  --   --  2.1 1.9 1.7  PHOS  --   --  3.1 1.8* 2.7   GFR: Estimated Creatinine Clearance: 51.7 mL/min (A) (by C-G formula based on SCr of 1.3 mg/dL (H)). Liver Function Tests: Recent Labs  Lab 01/02/19 2219 01/03/19 0559 01/04/19 0539 01/05/19 0521 01/06/19 0542  AST '22 16 15 16 '$ 13*  ALT '25 23 19 20 18  '$ ALKPHOS 38 35* 29* 35* 34*  BILITOT 0.9 0.6 0.7 0.6 0.8  PROT 5.2* 4.4* 4.7* 5.3* 5.2*  ALBUMIN 2.8* 2.4* 2.5* 2.6* 2.4*   No results for input(s): LIPASE, AMYLASE in the last 168 hours. No results for input(s): AMMONIA in the last 168 hours. Coagulation Profile: Recent Labs  Lab 01/02/19 2219  INR 1.2   Cardiac Enzymes: No results for input(s): CKTOTAL, CKMB, CKMBINDEX, TROPONINI in the last 168 hours. BNP (last 3 results) No results for input(s): PROBNP in the last 8760 hours. HbA1C: No results for input(s): HGBA1C in the last 72 hours. CBG: Recent Labs  Lab 01/05/19 1204 01/05/19 1650 01/05/19 2102 01/06/19 0735 01/06/19 1149  GLUCAP 143* 110* 116* 90 259*   Lipid Profile: No results for input(s): CHOL, HDL, LDLCALC, TRIG, CHOLHDL, LDLDIRECT in the last 72 hours. Thyroid Function Tests: Recent Labs    01/03/19 2248 01/05/19 0915  TSH 5.056*  --   FREET4  --  1.55*   Anemia Panel: No results for input(s): VITAMINB12, FOLATE, FERRITIN, TIBC, IRON, RETICCTPCT in the last 72 hours. Sepsis Labs: Recent Labs  Lab 01/02/19 2219  01/03/19 0046  LATICACIDVEN 0.9 0.8    Recent Results (from the past 240 hour(s))  Blood Culture (routine x 2)     Status: None (Preliminary result)   Collection Time: 01/02/19 10:49 PM   Specimen: BLOOD  Result Value Ref Range Status   Specimen Description   Final    BLOOD RIGHT ANTECUBITAL Performed at [facility] [address], [place], [place] [address]    Special Requests   Final    BOTTLES DRAWN AEROBIC AND ANAEROBIC Blood Culture adequate volume Performed at [facility] [address], [place], [place] [address]    Culture   Final    NO GROWTH 3 DAYS Performed at [facility], [place] [address] [place], [place] [address]    Report Status PENDING  Incomplete  Blood Culture (routine x 2)     Status: None (Preliminary result)   Collection Time: 01/02/19 10:49 PM   Specimen: BLOOD  Result Value Ref Range Status   Specimen Description   Final    BLOOD LEFT ANTECUBITAL Performed at [facility] [address], [place], [place] [address]    Special Requests   Final    BOTTLES DRAWN AEROBIC AND ANAEROBIC Blood Culture adequate volume Performed at [facility] [address] [place], [place] [address]    Culture   Final    NO GROWTH 3 DAYS Performed at [facility], [place] [address] [place], [place] [address]    Report Status PENDING  Incomplete  Urine culture     Status: None   Collection Time: 01/03/19 12:46 AM   Specimen: In/Out Cath Urine  Result Value Ref Range Status   Specimen Description   Final    IN/OUT CATH URINE Performed at  [facility], [place] [address], [place], [place] [address]    Special Requests   Final    NONE Performed at [facility], [place] [address], [place], [place] [address]    Culture   Final    NO GROWTH Performed at [facility], [place] [address] [place], [place] [address]    Report Status 01/04/2019  FINAL  Final  SARS Coronavirus 2 [facility] order, Performed in [facility] hospital lab) Nasopharyngeal Nasopharyngeal Swab     Status: None   Collection Time: 01/03/19 12:46 AM   Specimen: Nasopharyngeal Swab  Result Value Ref Range Status   SARS Coronavirus 2 NEGATIVE NEGATIVE Final    Comment: (NOTE) If result is NEGATIVE SARS-CoV-2 target nucleic acids are NOT DETECTED. The SARS-CoV-2 RNA is generally detectable in upper and lower  respiratory specimens during the acute phase of infection. The lowest  concentration of SARS-CoV-2 viral copies this assay can detect is 250  copies / mL. A negative result does not preclude SARS-CoV-2 infection  and should not be used as the sole basis for treatment or other  patient management decisions.  A negative result may occur with  improper specimen collection / handling, submission of specimen other  than nasopharyngeal swab, presence of viral mutation(s) within the  areas targeted by this assay, and inadequate number of viral copies  (<250 copies / mL). A negative result must be combined with clinical  observations, patient history, and epidemiological information. If result is POSITIVE SARS-CoV-2 target nucleic acids are DETECTED. The SARS-CoV-2 RNA is generally detectable in upper and lower  respiratory specimens dur ing the acute phase of infection.  Positive  results are indicative of active infection with SARS-CoV-2.  Clinical  correlation with patient history and other diagnostic information is  necessary to determine patient infection status.  Positive results do  not rule out bacterial infection or co-infection with other viruses. If result is PRESUMPTIVE POSTIVE SARS-CoV-2 nucleic acids MAY BE PRESENT.   A presumptive positive result was obtained on the submitted specimen  and confirmed on repeat testing.  While 2019 novel coronavirus  (SARS-CoV-2) nucleic acids may be present in the submitted sample  additional confirmatory  testing may be necessary for epidemiological  and / or clinical management purposes  to differentiate between  SARS-CoV-2 and other Sarbecovirus currently known to infect humans.  If clinically indicated additional testing with an alternate test  methodology [id number]) is advised. The SARS-CoV-2 RNA is generally  detectable in upper and lower respiratory sp ecimens during the acute  phase of infection. The expected result is Negative. Fact Sheet for Patients:  StrictlyIdeas.no Fact Sheet for Healthcare Providers: BankingDealers.co.za This test is not yet approved or cleared by the [place] FDA and has been authorized for detection and/or diagnosis of SARS-CoV-2 by FDA under an Emergency Use Authorization (EUA).  This EUA will remain in effect (meaning this test can be used) for the duration of the COVID-19 declaration under Section 564(b)(1) of the Act, 21 U.S.C. section 360bbb-3(b)(1), unless the authorization is terminated or revoked sooner. Performed at [facility], [place] [address], [place],  [address]   MRSA PCR Screening     Status: None   Collection Time: 01/04/19  4:59 PM   Specimen: Nasal Mucosa; Nasopharyngeal  Result Value Ref Range Status   MRSA by PCR NEGATIVE NEGATIVE Final    Comment:        The GeneXpert MRSA Assay (FDA approved for NASAL  specimens only), is one component of a comprehensive MRSA colonization surveillance program. It is not intended to diagnose MRSA infection nor to guide or monitor treatment for MRSA infections. Performed at [facility], [place] [address] [place], [place] [address]   Fungus culture, blood     Status: None (Preliminary result)   Collection Time: 01/05/19  2:29 PM   Specimen: BLOOD  Result Value Ref Range Status   Specimen Description   Final    BLOOD BLOOD RIGHT HAND Performed at [facility] [address], [place], [place] [address]    Special Requests   Final    BOTTLES DRAWN AEROBIC ONLY Blood Culture adequate volume Performed at [facility] [address], [place], [place] [address]    Culture   Final    NO GROWTH < 24 HOURS Performed at [facility] [address] [place], [place] [address]    Report Status PENDING  Incomplete    Radiology Studies: Ct Abdomen Pelvis Wo Contrast  Result Date: 01/06/2019 CLINICAL DATA:  Fever, metastatic melanoma EXAM: CT CHEST, ABDOMEN AND PELVIS WITHOUT CONTRAST TECHNIQUE: Multidetector CT imaging of the chest, abdomen and pelvis was performed following the standard protocol without IV contrast. COMPARISON:  PET-CT, 09/18/2018 FINDINGS: CT CHEST FINDINGS Cardiovascular: Aortic atherosclerosis. Normal heart size. Three-vessel coronary artery calcifications and/or stents status post median sternotomy and CABG. Left chest multi lead pacer. No pericardial effusion. Mediastinum/Nodes: No enlarged mediastinal, hilar, or axillary lymph nodes. Thyroid gland, trachea, and esophagus demonstrate no significant findings. Lungs/Pleura: Small bilateral pleural effusions with associated atelectasis or consolidation. There is a small, stable, calcified benign nodule of the lingula measuring 5 mm (series 6, image 87). Musculoskeletal: No chest wall mass or suspicious bone lesions identified. CT ABDOMEN PELVIS FINDINGS Hepatobiliary: No solid liver abnormality is seen. No gallstones, gallbladder wall thickening, or biliary dilatation. Pancreas: Unremarkable. No pancreatic ductal dilatation or surrounding inflammatory changes. Spleen: Normal in size without significant abnormality. Adrenals/Urinary Tract: Adrenal glands are unremarkable. Multiple low-attenuation lesions of the kidneys, incompletely characterized although likely cysts. Bladder is unremarkable. Stomach/Bowel: Stomach is within normal limits. Appendix appears normal. No evidence of bowel wall  thickening, distention, or inflammatory changes. Pancolonic diverticulosis. Moderate burden of stool colon. Vascular/Lymphatic: Tortuous and severely atherosclerotic aorta, measuring up to 3.1 x 2.9 cm in the infrarenal portion of the vessel. No enlarged abdominal or pelvic lymph nodes. Reproductive: No mass or other abnormality. Other: No abdominal wall hernia or abnormality. No abdominopelvic ascites. Musculoskeletal: Levoscoliosis of the lumbar spine with associated disc degenerative disease. IMPRESSION: 1. Small bilateral pleural effusions with associated atelectasis or consolidation. 2. No definite CT findings of the chest, abdomen, or pelvis to explain fevers. 3. No noncontrast CT evidence of metastatic disease in the chest, abdomen, or pelvis. 4. Redemonstrated infrarenal abdominal aortic aneurysm measuring up to 3.1 cm. Recommend followup by ultrasound in 3 years. This recommendation follows ACR consensus guidelines: White Paper of the ACR Incidental Findings Committee II on Vascular Findings. J Am Coll Radiol 2013; 10:789-794. Aortic aneurysm NOS (ICD10-I71.9) 5.  Coronary artery disease.  Aortic Atherosclerosis (ICD10-I70.0). Electronically Signed   By: Eddie Candle M.D.   On: 01/06/2019 08:16   Ct Chest Wo Contrast  Result Date: 01/06/2019 CLINICAL DATA:  Fever, metastatic melanoma EXAM: CT CHEST, ABDOMEN AND PELVIS WITHOUT CONTRAST TECHNIQUE: Multidetector CT imaging of the chest, abdomen and pelvis was performed following the standard protocol without IV contrast. COMPARISON:  PET-CT, 09/18/2018 FINDINGS: CT  CHEST FINDINGS Cardiovascular: Aortic atherosclerosis. Normal heart size. Three-vessel coronary artery calcifications and/or stents status post median sternotomy and CABG. Left chest multi lead pacer. No pericardial effusion. Mediastinum/Nodes: No enlarged mediastinal, hilar, or axillary lymph nodes. Thyroid gland, trachea, and esophagus demonstrate no significant findings. Lungs/Pleura: Small  bilateral pleural effusions with associated atelectasis or consolidation. There is a small, stable, calcified benign nodule of the lingula measuring 5 mm (series 6, image 87). Musculoskeletal: No chest wall mass or suspicious bone lesions identified. CT ABDOMEN PELVIS FINDINGS Hepatobiliary: No solid liver abnormality is seen. No gallstones, gallbladder wall thickening, or biliary dilatation. Pancreas: Unremarkable. No pancreatic ductal dilatation or surrounding inflammatory changes. Spleen: Normal in size without significant abnormality. Adrenals/Urinary Tract: Adrenal glands are unremarkable. Multiple low-attenuation lesions of the kidneys, incompletely characterized although likely cysts. Bladder is unremarkable. Stomach/Bowel: Stomach is within normal limits. Appendix appears normal. No evidence of bowel wall thickening, distention, or inflammatory changes. Pancolonic diverticulosis. Moderate burden of stool colon. Vascular/Lymphatic: Tortuous and severely atherosclerotic aorta, measuring up to 3.1 x 2.9 cm in the infrarenal portion of the vessel. No enlarged abdominal or pelvic lymph nodes. Reproductive: No mass or other abnormality. Other: No abdominal wall hernia or abnormality. No abdominopelvic ascites. Musculoskeletal: Levoscoliosis of the lumbar spine with associated disc degenerative disease. IMPRESSION: 1. Small bilateral pleural effusions with associated atelectasis or consolidation. 2. No definite CT findings of the chest, abdomen, or pelvis to explain fevers. 3. No noncontrast CT evidence of metastatic disease in the chest, abdomen, or pelvis. 4. Redemonstrated infrarenal abdominal aortic aneurysm measuring up to 3.1 cm. Recommend followup by ultrasound in 3 years. This recommendation follows ACR consensus guidelines: White Paper of the ACR Incidental Findings Committee II on Vascular Findings. J Am Coll Radiol 2013; 10:789-794. Aortic aneurysm NOS (ICD10-I71.9) 5.  Coronary artery disease.  Aortic  Atherosclerosis (ICD10-I70.0). Electronically Signed   By: Eddie Candle M.D.   On: 01/06/2019 08:16   Dg Chest Port 1 View  Result Date: 01/05/2019 CLINICAL DATA:  Shortness of breath EXAM: PORTABLE CHEST 1 VIEW COMPARISON:  01/02/2019 FINDINGS: 0359 hours. Asymmetric elevation right hemidiaphragm. The lungs are clear without focal pneumonia, edema, pneumothorax or pleural effusion. The cardiopericardial silhouette is within normal limits for size. Left permanent pacemaker noted. The visualized bony structures of the thorax are intact. Telemetry leads overlie the chest. IMPRESSION: No acute cardiopulmonary findings. Electronically Signed   By: Misty Stanley M.D.   On: 01/05/2019 08:39   Scheduled Meds:  sodium chloride   Intravenous Once   aspirin EC  81 mg Oral QPM   citalopram  10 mg Oral Daily   enoxaparin (LOVENOX) injection  40 mg Subcutaneous Q24H   gabapentin  100 mg Oral BID   guaiFENesin  1,200 mg Oral BID   insulin aspart  0-9 Units Subcutaneous TID WC   levothyroxine  137 mcg Oral Q0600   methylphenidate  5 mg Oral BID WC   polyethylene glycol  17 g Oral BID   predniSONE  5 mg Oral Q breakfast   rosuvastatin  20 mg Oral QHS   senna-docusate  1 tablet Oral BID   Continuous Infusions:  doxycycline (VIBRAMYCIN) IV 100 mg (01/06/19 0831)   meropenem (MERREM) IV 1 g (01/06/19 1102)   sodium bicarbonate 150 mEq in dextrose 5% 1000 mL 150 mEq (01/06/19 1150)    LOS: 3 days   Kerney Elbe, DO [facility] PAGER is on AMION  If 7PM-7AM, please contact night-coverage [email or web address] Password [facility] 01/06/2019, 3:49  PM

## 2019-01-06 NOTE — Progress Notes (Signed)
Called [facility], asking nurse to have patient sign HIPPA form to allow [place] to discuss care with [name]. I was notified it was signed and located under media tab.

## 2019-01-07 ENCOUNTER — Inpatient Hospital Stay (HOSPITAL_COMMUNITY): Payer: Medicare Other

## 2019-01-07 ENCOUNTER — Encounter: Payer: Medicare Other | Admitting: Physical Medicine and Rehabilitation

## 2019-01-07 DIAGNOSIS — A689 Relapsing fever, unspecified: Secondary | ICD-10-CM

## 2019-01-07 DIAGNOSIS — M7989 Other specified soft tissue disorders: Secondary | ICD-10-CM

## 2019-01-07 DIAGNOSIS — R21 Rash and other nonspecific skin eruption: Secondary | ICD-10-CM

## 2019-01-07 LAB — CBC WITH DIFFERENTIAL/PLATELET
Abs Immature Granulocytes: 0 10*3/uL (ref 0.00–0.07)
Basophils Absolute: 0 10*3/uL (ref 0.0–0.1)
Basophils Relative: 0 %
Eosinophils Absolute: 0 10*3/uL (ref 0.0–0.5)
Eosinophils Relative: 2 %
HCT: 28.6 % — ABNORMAL LOW (ref 39.0–52.0)
Hemoglobin: 9.5 g/dL — ABNORMAL LOW (ref 13.0–17.0)
Lymphocytes Relative: 19 %
Lymphs Abs: 0.4 10*3/uL — ABNORMAL LOW (ref 0.7–4.0)
MCH: 32.9 pg (ref 26.0–34.0)
MCHC: 33.2 g/dL (ref 30.0–36.0)
MCV: 99 fL (ref 80.0–100.0)
Monocytes Absolute: 0.6 10*3/uL (ref 0.1–1.0)
Monocytes Relative: 28 %
Neutro Abs: 1.1 10*3/uL — ABNORMAL LOW (ref 1.7–7.7)
Neutrophils Relative %: 51 %
Platelets: 125 10*3/uL — ABNORMAL LOW (ref 150–400)
RBC: 2.89 MIL/uL — ABNORMAL LOW (ref 4.22–5.81)
RDW: 15.4 % (ref 11.5–15.5)
WBC: 2.2 10*3/uL — ABNORMAL LOW (ref 4.0–10.5)
nRBC: 0 % (ref 0.0–0.2)

## 2019-01-07 LAB — GLUCOSE, CAPILLARY
Glucose-Capillary: 146 mg/dL — ABNORMAL HIGH (ref 70–99)
Glucose-Capillary: 214 mg/dL — ABNORMAL HIGH (ref 70–99)
Glucose-Capillary: 302 mg/dL — ABNORMAL HIGH (ref 70–99)
Glucose-Capillary: 248 mg/dL — ABNORMAL HIGH (ref 70–99)

## 2019-01-07 LAB — COMPREHENSIVE METABOLIC PANEL
ALT: 17 U/L (ref 0–44)
AST: 14 U/L — ABNORMAL LOW (ref 15–41)
Albumin: 2.4 g/dL — ABNORMAL LOW (ref 3.5–5.0)
Alkaline Phosphatase: 34 U/L — ABNORMAL LOW (ref 38–126)
Anion gap: 9 (ref 5–15)
BUN: 27 mg/dL — ABNORMAL HIGH (ref 8–23)
CO2: 24 mmol/L (ref 22–32)
Calcium: 7.6 mg/dL — ABNORMAL LOW (ref 8.9–10.3)
Chloride: 100 mmol/L (ref 98–111)
Creatinine, Ser: 1.52 mg/dL — ABNORMAL HIGH (ref 0.61–1.24)
GFR calc Af Amer: 50 mL/min — ABNORMAL LOW (ref >60)
GFR calc non Af Amer: 43 mL/min — ABNORMAL LOW (ref >60)
Glucose, Bld: 159 mg/dL — ABNORMAL HIGH (ref 70–99)
Potassium: 3.9 mmol/L (ref 3.5–5.1)
Sodium: 133 mmol/L — ABNORMAL LOW (ref 135–145)
Total Bilirubin: 0.8 mg/dL (ref 0.3–1.2)
Total Protein: 5.3 g/dL — ABNORMAL LOW (ref 6.5–8.1)

## 2019-01-07 LAB — SYNOVIAL CELL COUNT + DIFF, W/ CRYSTALS
Monocyte-Macrophage-Synovial Fluid: 8 % — ABNORMAL LOW (ref 50–90)
Neutrophil, Synovial: 92 % — ABNORMAL HIGH (ref 0–25)
WBC, Synovial: 29400 /mm3 — ABNORMAL HIGH (ref 0–200)

## 2019-01-07 LAB — MAGNESIUM: Magnesium: 1.9 mg/dL (ref 1.7–2.4)

## 2019-01-07 LAB — PHOSPHORUS: Phosphorus: 2.3 mg/dL — ABNORMAL LOW (ref 2.5–4.6)

## 2019-01-07 LAB — ROCKY MTN SPOTTED FVR ABS PNL(IGG+IGM)
RMSF IgG: NEGATIVE
RMSF IgM: 0.22 index (ref 0.00–0.89)

## 2019-01-07 LAB — LEGIONELLA PNEUMOPHILA SEROGP 1 UR AG: L. pneumophila Serogp 1 Ur Ag: NEGATIVE

## 2019-01-07 MED ORDER — METHYLPREDNISOLONE ACETATE 80 MG/ML IJ SUSP
80.0000 mg | Freq: Once | INTRAMUSCULAR | Status: DC
  Filled 2019-01-07: qty 1

## 2019-01-07 MED ORDER — SODIUM PHOSPHATES 45 MMOLE/15ML IV SOLN
20.0000 mmol | Freq: Once | INTRAVENOUS | Status: AC
  Administered 2019-01-07: 20 mmol via INTRAVENOUS
  Filled 2019-01-07: qty 6.67

## 2019-01-07 MED ORDER — PREDNISONE 50 MG PO TABS
60.0000 mg | ORAL_TABLET | Freq: Every day | ORAL | Status: DC
  Administered 2019-01-08 – 2019-01-09 (×2): 60 mg via ORAL
  Filled 2019-01-07 (×2): qty 1

## 2019-01-07 MED ORDER — BUPIVACAINE HCL 0.5 % IJ SOLN
10.0000 mL | Freq: Once | INTRAMUSCULAR | Status: DC
  Filled 2019-01-07: qty 10

## 2019-01-07 MED ORDER — SODIUM BICARBONATE-DEXTROSE 150-5 MEQ/L-% IV SOLN
150.0000 meq | INTRAVENOUS | Status: DC
  Administered 2019-01-07 – 2019-01-09 (×4): 150 meq via INTRAVENOUS
  Filled 2019-01-07 (×4): qty 1000

## 2019-01-07 MED ORDER — SODIUM CHLORIDE 0.9 % IV SOLN
1.0000 g | Freq: Two times a day (BID) | INTRAVENOUS | Status: DC
  Administered 2019-01-07: 1 g via INTRAVENOUS
  Filled 2019-01-07: qty 1

## 2019-01-07 NOTE — Progress Notes (Addendum)
Pharmacy Antibiotic Note  David [name]. is a 78 y.o. male admitted on 01/02/2019 with febrile neutropenia with possibility of pneumonia. Patient intiially placed on Vancomycin, Cefepime, and Azithromycin. Cefepime broadened to Meropenem. Pharmacy has been consulted for Meropenem dosing. On doxycyline  Today, 01/07/2019  Day #4 (full days) of antibiotic (cefepime/meropenem)  SCr trending back up,  CrCl now < 50  WBC = 2.7 (ANC = 1100 - sl down from previous day) s/p granix x 1 9/26  Last fever 101.1 9/28 at 2pm  Cultures unrevealing  Plan: Adjust meropenem to 1gm IV q12h for SCr increase today Monitor renal function, cultures, clinical course.    Height: 5' 9.5" (176.5 cm) Weight: 199 lb 15.3 oz (90.7 kg) IBW/kg (Calculated) : 71.85  Temp (24hrs), Avg:99.7 F (37.6 C), Min:98.5 F (36.9 C), Max:101.1 F (38.4 C)  Recent Labs  Lab 01/02/19 2219 01/03/19 0046 01/03/19 0559 01/04/19 0539 01/05/19 0521 01/06/19 0542 01/07/19 0518  WBC 0.8*  --  1.0* 0.7* 2.9* 2.7* 2.2*  CREATININE 1.66*  --  1.58* 1.31* 1.17 1.30* 1.52*  LATICACIDVEN 0.9 0.8  --   --   --   --   --     Estimated Creatinine Clearance: 44.3 mL/min (A) (by C-G formula based on SCr of 1.52 mg/dL (H)).    Allergies  Allergen Reactions  . Ezetimibe Other (See Comments)    Unknown reaction  . Simvastatin Other (See Comments)    Reaction:  Gave pt a fever  Fever - temp of 103.   In 2003    Antimicrobials this admission: 9/24 Zosyn x 1 9/25 Cefepime >> 9/27 9/25 Vancomycin >> 9/25 Azithromycin >> 9/27 Meropenem >>  Microbiology results: 9/24 BCx: NGTD  9/25 UCx: NGF 9/25 COVID: negative  9/25 Strep pneumo ur ag: neg 9/25 Legionella ur ag: pending 9/26 MRSA PCR: negative 9/27 Fungus culture: pending  Thank you for allowing pharmacy to be a part of this patient's care.   Doreene Eland, PharmD, BCPS.   Work Cell: [phone] 01/07/2019 9:07 AM

## 2019-01-07 NOTE — Progress Notes (Signed)
Patient ID: [name], male   DOB: 04-12-1940, 78 y.o.   MRN: [id number]         [facility]  Date of Admission:  01/02/2019   Total days of antibiotics 5        Day 3 meropenem        Day 3 doxycycline         ASSESSMENT: I suspect that his recurrent fevers are due to a flare of his gout.  I agree with increasing his prednisone.  I do not see any clear evidence of active infection.  I will stop meropenem and doxycycline now.  Hopefully his bone marrow examination will shed some light on his pancytopenia.  PLAN: 1. Discontinue meropenem and doxycycline  Principal Problem:   Recurrent fever Active Problems:   Pancytopenia (HCC)   Gout   Malignant melanoma ([place])   Metastasis to brain [facility])   Metastatic melanoma to lung (HCC)   Prostate cancer ([place])   ICH (intracerebral hemorrhage) (HCC) - L frontal due to HTN vs CAA   OSA on CPAP   Biventricular ICD (implantable cardioverter-defibrillator) in place   Chronic kidney disease, stage III (moderate) (HCC)   Acquired hypothyroidism   Ischemic cardiomyopathy   DJD (degenerative joint disease)   Hx of CABG   Heart block AV complete ([place])   Controlled type 2 diabetes mellitus with hyperglycemia, without long-term current use of insulin (HCC)   Scheduled Meds: . sodium chloride   Intravenous Once  . aspirin EC  81 mg Oral QPM  . citalopram  10 mg Oral Daily  . colchicine  0.6 mg Oral BID  . enoxaparin (LOVENOX) injection  40 mg Subcutaneous Q24H  . gabapentin  100 mg Oral BID  . guaiFENesin  1,200 mg Oral BID  . insulin aspart  0-9 Units Subcutaneous TID WC  . levothyroxine  137 mcg Oral Q0600  . methylphenidate  5 mg Oral BID WC  . polyethylene glycol  17 g Oral BID  . predniSONE  40 mg Oral Q breakfast  . senna-docusate  1 tablet Oral BID   Continuous Infusions: . doxycycline (VIBRAMYCIN) IV Stopped (01/06/19 2250)  . meropenem (MERREM) IV 1 g (01/07/19 0923)  . sodium bicarbonate  150 mEq in dextrose 5% 1000 mL 150 mEq (01/07/19 0916)  . sodium phosphate  Dextrose 5% IVPB     PRN Meds:.acetaminophen **OR** acetaminophen, levalbuterol, ondansetron **OR** ondansetron (ZOFRAN) IV, oxyCODONE, traZODone   SUBJECTIVE: "I am not doing so well".  He says that he is hurting all over.  Review of Systems: Review of Systems  Constitutional: Positive for fever.  Respiratory: Negative for cough.   Cardiovascular: Negative for chest pain.  Gastrointestinal: Negative for abdominal pain, diarrhea, nausea and vomiting.  Genitourinary: Negative for dysuria.  Musculoskeletal: Positive for joint pain.    Allergies  Allergen Reactions  . Ezetimibe Other (See Comments)    Unknown reaction  . Simvastatin Other (See Comments)    Reaction:  Gave pt a fever  Fever - temp of 103.   In 2003    OBJECTIVE: Vitals:   01/06/19 1414 01/06/19 1946 01/06/19 2300 01/07/19 0437  BP:  118/65  132/76  Pulse:  77  88  Resp:  18 16 16   Temp: (!) 101.1 F (38.4 C) 99.3 F (37.4 C)  98.5 F (36.9 C)  TempSrc: Oral Oral    SpO2:  97% 97% 95%  Weight:      Height:  Body mass index is 29.11 kg/m.  Physical Exam Constitutional:      Comments: He is alert.  Sitting up in bed.  He appears worried and uncomfortable.  Cardiovascular:     Rate and Rhythm: Normal rate and regular rhythm.     Heart sounds: No murmur.  Pulmonary:     Effort: Pulmonary effort is normal.     Breath sounds: Normal breath sounds.  Chest:    Abdominal:     Palpations: Abdomen is soft.     Tenderness: There is no abdominal tenderness.  Musculoskeletal:        General: Swelling, tenderness and deformity present.     Comments: He has diffuse swelling of his hands and fingers with redness over the left second and third MCP joints.  His joints are very tender with palpation and range of motion.  His left medial malleolus remains red and warm his right ankle is warm.  Both ankles are tender with palpation  and range of motion.  Skin:    Comments: He has a splotchy red, macular rash over hands and fingers.  Psychiatric:        Mood and Affect: Mood normal.     Lab Results Lab Results  Component Value Date   WBC 2.2 (L) 01/07/2019   HGB 9.5 (L) 01/07/2019   HCT 28.6 (L) 01/07/2019   MCV 99.0 01/07/2019   PLT 125 (L) 01/07/2019    Lab Results  Component Value Date   CREATININE 1.52 (H) 01/07/2019   BUN 27 (H) 01/07/2019   NA 133 (L) 01/07/2019   K 3.9 01/07/2019   CL 100 01/07/2019   CO2 24 01/07/2019    Lab Results  Component Value Date   ALT 17 01/07/2019   AST 14 (L) 01/07/2019   ALKPHOS 34 (L) 01/07/2019   BILITOT 0.8 01/07/2019     Microbiology: Recent Results (from the past 240 hour(s))  Blood Culture (routine x 2)     Status: None (Preliminary result)   Collection Time: 01/02/19 10:49 PM   Specimen: BLOOD  Result Value Ref Range Status   Specimen Description   Final    BLOOD RIGHT ANTECUBITAL Performed at [facility], [place] [address] [place], [place] [address]    Special Requests   Final    BOTTLES DRAWN AEROBIC AND ANAEROBIC Blood Culture adequate volume Performed at [place] [address], [place], [place] [address]    Culture   Final    NO GROWTH 4 DAYS Performed at [facility], [place] [address], [place], [place] [address]    Report Status PENDING  Incomplete  Blood Culture (routine x 2)     Status: None (Preliminary result)   Collection Time: 01/02/19 10:49 PM   Specimen: BLOOD  Result Value Ref Range Status   Specimen Description   Final    BLOOD LEFT ANTECUBITAL Performed at [place] [address] [place], [place] [address]    Special Requests   Final    BOTTLES DRAWN AEROBIC AND ANAEROBIC Blood Culture adequate volume Performed at [facility] [address], [place], [place] [address]    Culture   Final    NO GROWTH 4 DAYS Performed at [facility], [place] [address], [place], [place] [address]    Report Status PENDING  Incomplete  Urine culture     Status: None   Collection Time: 01/03/19 12:46 AM   Specimen:  In/Out Cath Urine  Result Value Ref Range Status   Specimen Description   Final    IN/OUT CATH URINE Performed at [facility], [place] [address] [place], [place] [address]    Special Requests   Final    NONE Performed at [facility], [place] [address], [place], [place] [address]    Culture   Final    NO GROWTH Performed at [facility], [place] [address], [place], [place] [address]    Report Status 01/04/2019 FINAL  Final  SARS Coronavirus 2 [facility] order, Performed in [facility] hospital lab) Nasopharyngeal Nasopharyngeal Swab     Status: None   Collection Time: 01/03/19 12:46 AM   Specimen: Nasopharyngeal Swab  Result Value Ref Range Status   SARS Coronavirus 2 NEGATIVE NEGATIVE Final    Comment: (NOTE) If result is NEGATIVE SARS-CoV-2 target nucleic acids are NOT DETECTED. The SARS-CoV-2 RNA is generally detectable in upper and lower  respiratory specimens during the acute phase of infection. The lowest  concentration of SARS-CoV-2 viral copies this assay can detect is 250  copies / mL. A negative result does not preclude SARS-CoV-2 infection  and should not be used as the sole basis for treatment or other  patient management decisions.  A negative result may occur with  improper specimen collection / handling, submission of specimen other  than nasopharyngeal swab, presence of viral mutation(s) within the  areas targeted by this assay, and inadequate number of viral copies  (<250 copies / mL). A negative result must be combined with clinical  observations, patient history, and epidemiological information. If result is POSITIVE SARS-CoV-2 target nucleic acids are DETECTED. The SARS-CoV-2 RNA is generally detectable in upper and lower  respiratory specimens dur ing  the acute phase of infection.  Positive  results are indicative of active infection with SARS-CoV-2.  Clinical  correlation with patient history and other diagnostic information is  necessary to determine patient infection status.  Positive results do  not rule out bacterial infection or co-infection with other viruses. If result is PRESUMPTIVE POSTIVE SARS-CoV-2 nucleic acids MAY BE PRESENT.   A presumptive positive result was obtained on the submitted specimen  and confirmed on repeat testing.  While 2019 novel coronavirus  (SARS-CoV-2) nucleic acids may be present in the submitted sample  additional confirmatory testing may be necessary for epidemiological  and / or clinical management purposes  to differentiate between  SARS-CoV-2 and other Sarbecovirus currently known to infect humans.  If clinically indicated additional testing with an alternate test  methodology [phone]) is advised. The SARS-CoV-2 RNA is generally  detectable in upper and lower respiratory sp ecimens during the acute  phase of infection. The expected result is Negative. Fact Sheet for Patients:  StrictlyIdeas.no Fact Sheet for Healthcare Providers: BankingDealers.co.za This test is not yet approved or cleared by the [place] FDA and has been authorized for detection and/or diagnosis of SARS-CoV-2 by FDA under an Emergency Use Authorization (EUA).  This EUA will remain in effect (meaning this test can be used) for the duration of the COVID-19 declaration under Section 564(b)(1) of the Act, 21 U.S.C. section 360bbb-3(b)(1), unless the authorization is terminated or revoked sooner. Performed at [facility], [place] [address], [place],  [address]   MRSA PCR Screening     Status: None   Collection Time: 01/04/19  4:59 PM   Specimen: Nasal Mucosa; Nasopharyngeal  Result Value Ref Range Status   MRSA by  PCR NEGATIVE NEGATIVE Final     Comment:        The GeneXpert MRSA Assay (FDA approved for NASAL specimens only), is one component of a comprehensive MRSA colonization surveillance program. It is not intended to diagnose MRSA infection nor to guide or monitor treatment for MRSA infections. Performed at [facility], [place] [address] [place], [place] [address]   Fungus culture, blood     Status: None (Preliminary result)   Collection Time: 01/05/19  2:29 PM   Specimen: BLOOD  Result Value Ref Range Status   Specimen Description   Final    BLOOD BLOOD RIGHT HAND Performed at [place] [address], [place], [place] [address]    Special Requests   Final    BOTTLES DRAWN AEROBIC ONLY Blood Culture adequate volume Performed at [place] [address], [place], [place] [address]    Culture   Final    NO GROWTH 2 DAYS Performed at [place] [address] [place], [place] [address]    Report Status PENDING  Incomplete    Michel Bickers, MD [facility] [phone] pager   [phone] cell 01/07/2019, 9:31 AM

## 2019-01-07 NOTE — Progress Notes (Signed)
Patient ID: David [name]., male   DOB: [birth year], 78 y.o.   MRN: [id number]  Ankle arthrocentesis confirms gout. Please treat as such. Orthopedics will sign off. Please call with questions.    Lisette Abu, PA-C Orthopedic Surgery [phone]

## 2019-01-07 NOTE — Progress Notes (Signed)
PROGRESS NOTE    David [name].  JTT:[id number] DOB: 07/04/1940 DOA: 01/02/2019 PCP: [name], MD   Brief Narrative:  David [name], David [name]. is a 78 year old Caucasian male with a past medical history significant for but not limited to history of metastatic malignant melanoma on immunotherapy, history of prostate cancer, diabetes mellitus type 2, hypothyroidism, history of complete heart block status post pacemaker placement, CAD status post CABG, history of ischemic cardiomyopathy status post ICD and other comorbidities who presented with fevers and chills at home with a temperature of 101.  Family was concerned and brought him to the ER he was found to be neutropenic.  Chest x-ray showed a likely pneumonia and he is admitted for further work-up.  Patient's blood pressure remains on the lower side.    In the ED he was given 3 L of normal saline boluses and started on maintenance IV fluid hydration, stress dose steroids, as well as being placed on IV cefepime and IV azithromycin.  IV vancomycin was also added given his hypotension and because of his febrile neutropenia.  Medical oncology Dr. Burr Medico was consulted for further evaluation and giving Granix.  Patient was transfused 1 unit of PRBCs.  He continues to spike temperatures and ANC is still relatively low.  If he continues to spike temperatures and is not improving clinically by tomorrow we will change Cefepime to IV meropenem and in the interim we will obtain fungal cultures.    Interval History on 01/06/2019 Patient also had a flu swab done which was negative and fungal blood culture is pending but showed NGTD <24 Hours.  Infectious diseases was consulted at family request as well as palliative care medicine and Dr. Megan Salon evaluated today and feels that he may have a mild flare of his gout in his left ankle contributing to his fever and is considering stopping empiric antibiotics as his David [name] is improved currently we do not have an  exact cause of why he remains intermittently febrile.  Palliative Care to see the patient for goals of care discussion and PT OT recommending skilled nursing facility.  Interval Hx on 01/07/2019 Left Ankle was more swollen and warm so asked Ortho to do an arthrocentesis and Fluid Analysis was consistent with Gout. Was having Urinary Hesitancy so will obtain a Renal U/S and a Bladder Scan. Continues to spike Temperatures and Abx have now stopped. Plan is for Bone Marrow Biopsy in AM and Dr. Burr Medico recommending holding Granix for today and give it after bone marrow biopsy tomorrow if necessary.  Because source of fever still not found but likely secondary to gout a vascular ultrasound was done for his proximity to rule out DVT  Assessment & Plan:   Principal Problem:   Recurrent fever Active Problems:   ICH (intracerebral hemorrhage) (HCC) - L frontal due to HTN vs CAA   OSA on CPAP   Biventricular ICD (implantable cardioverter-defibrillator) in place   Chronic kidney disease, stage III (moderate) (HCC)   Malignant melanoma ([place])   Metastasis to brain ([place])   Metastatic melanoma to lung [facility])   Acquired hypothyroidism   Ischemic cardiomyopathy   DJD (degenerative joint disease)   Hx of CABG   Prostate cancer (HCC)   Heart block AV complete ([place])   Controlled type 2 diabetes mellitus with hyperglycemia, without long-term current use of insulin (HCC)   Pancytopenia (HCC)   Gout  Febrile Neutropenia, with recurrent fevers -Given that possibility of pneumonia patient is on empiric antibiotics with Cefepime  and Zithromax. Follow cultures. Added vancomycin since patient's blood pressure is in the low normal and he is continuing to Spike Temperatures but will stop now given Negative MRSA PCR -Consulted patient's oncologist Dr. Burr Medico who recommended Granix and will continue Broad Spectrum Abx -Since patient was on prednisone will give stress dose steroids and resume home Prednisone and this was  continued -Blood Cx x2 pending and Showed No Growth to Date at 4 Days -Urinalysis appeared relatively stable and had moderate blood but no other remarkable findings -TMax today was 102 WBC is 2.2and ANC is 1100 -Received Granix and improved and oncology is holding off further Granix administration -Obtain fungal cultures and check MRSA PCR; MRSA PCR was negative so we will stop IV vancomycin -Fungal cultures are no growth to date today -Repeat chest x-ray showed no acute cardiopulmonary findings -Continue antibiotics as above and supportive care -Continue neutropenic precautions -Follow cultures and temperature curve as well as WBC -Discussed with the patient's daughter at length and because this is the patient's third hospitalization a few weeks she requested a palliative care consult with Dr. Hilma Favors for further goals of care discussion given that he is never had one -Medical Oncology arranging for a bone marrow biopsy and will likely need be done in the near future and they are arranging to do it on Wednesday -Infectious diseases was consulted and at the daughter's request that she ran into Dr. Graylon Good of infectious disease in her neighborhood; I called Dr. Megan Salon who evaluated the patient and recommended stopping antibiotics with meropenem and doxycycline today given the possibility that this is related to his gout. -A CT of the chest abdomen pelvis was done without contrast and showed "Small bilateral pleural effusions with associated atelectasis or consolidation.. No definite CT findings of the chest, abdomen, or pelvis to explain fevers. No noncontrast CT evidence of metastatic disease in the chest, abdomen, or pelvis. Redemonstrated infrarenal abdominal aortic aneurysm measuring up to 3.1 cm. Recommend followup by ultrasound in 3 years." -I spoke with Dr. Tommy Medal over the weekend via telephone who recommended stopping azithromycin and started doxycycline.  Patient is on doxycycline and IV  meropenem and still spiking temperatures and Dr. Megan Salon is now stopped his antibiotics today -Infectious Diseases may feel this may be related to gout flare and left ankle was more swollen today so I asked orthopedics to do a arthrocentesis and fluid analysis was sent and findings were more consistent with an acute gout flare -Will also order LE Duplex to r/o DVT and did not show any evidence of a DVT **Daughter stated last time patient had a drug fever related to his statin and currently he has been getting rosuvastatin so we will hold his rosuvastatin for now   History of Hypertension  -We will hold antihypertensives due to low normal blood pressure.  -We will give stress dose steroids and resumed his home prednisone dosing afterwards however it was found out that he is on the prednisone for acute gout flare and this was a taper.  We will resume an increased dose to 60 mg and then taper again to wean off -Blood pressure is 122/66  History of CAD status post CABG  -Denies any chest pain. On statins.  -Will hold metoprolol due to low normal blood pressure.  Chronic Steroids?; Daughter states he was started on it for Gout Flare last time -We will keep patient on prednisone but also give stress dose steroids on admission; Increase Prednisone 40 mg for Suspected Gout  Flare and further increased to 60 mg p.o. daily Daughter states that prednisone taper was almost weaned off prior to discharge however he never finished tapering off and He was on 5 mg on admission -We will start on 60 daily for now and further increase as necessary  Hypothyroidism  -Checked TSH on admission was 2.54 and worsened to 5.056 -T4 went from 1.01 is now 1.55 -In the setting of patient being sick so we will repeat TFTs in 4 to 6 weeks after he is improved -On Synthroid 137 mcg p.o. daily and will continue  Diabetes Mellitus Type 2  -We will keep patient on sliding scale coverage -CBGs have been ranging from  146-259 and worsened in the setting of prednisone administration and steroid demargination -Continue to monitor and adjust insulin regimen as necessary  History of ischemic cardiomyopathy status post ICD  -Presently appears compensated. -We will currently fluid restricted 1200 mL -IV fluids have now been stopped -strict I's and O's and daily weights and continue to monitor for signs and symptoms of volume overload -Patient is + 2.3-4 L since admission  Metastatic Malignant Melanoma  -being followed by Dr. Burr Medico and have consulted her for further recc's -Given Granix one-time dose and has improved his [place] -Check LE to r/o DVT was negative for DVT  History of Prostate Cancer. -Sees Dr. Burr Medico -Getting a bone marrow biopsy tomorrow  Chronic kidney disease stage III; slightly worsened Metabolic acidosis -Creatinine appears to be close to baseline and BUN/creatinine is now improved from yesterday at 66/1.58 -> 52/1.31 -> 31/1.17 -> 26/1.30 -Resumed IV fluid hydration with sodium bicarbonate 150 mEq at 75 mL per hour for now -Patient's CO2 was 18, anion gap was 13, and chloride was 103; now chloride is 100, CO2 24, and anion gap is 9 -Avoid nephrotoxic medications, contrast dyes as well as hypotension if possible -Repeat CMP in a.m.  History of Complete Heart Block  -Status post pacemaker placement. -C/w Telemetry  Pancytopenia -WBC on Admit was 1000, hemoglobin/hematocrit was 7.3/22.9, and Platelet count was 123,000 -Discussed with Dr. Burr Medico and she started the patient on Granix -We will type and screen transfuse 1 unit of PRBCs and after blood transfusion hemoglobin/hematocrit is now 9.5/29.1 -Patient's WBC is now 2. 2 and platelet count is 125,000 and ANC is 1100 -Discontinue broad-spectrum antibiotic treatment as above -Further recommendations appreciated from Hematology; patient to get a bone marrow biopsy and if necessary get Granix after bone marrow biopsy  tomorrow  Constipation -Bowel regimen was started with MiraLAX 17 g twice daily, senna docusate -Continue monitor  Hypophosphatemia -Patient's phosphorus level this morning was 2.3 -Replete -Continue to monitor and replete as necessary -Repeat phosphorus level in a.m.  AAA -CT scan showed "redemonstrated infrarenal abdominal aortic aneurysm measuring up to 3.1 cm." -Recommend followup by ultrasound in 3 years  Hyponatremia -Patient sodium is 133 today -IV fluids as above -We will continue to monitor and trend and repeat CMP in a.m.  Acute Gout Flare -Received stress dose steroids in the ED and now back on prednisone 5 mg daily -Last Uric Acid Level was 8.0 on 01/02/2019 repeat level was 6.6 -Increased Prednisone to 40 mg po Daily but will further increase to 60 mg po Daily   -Possibly related to left ankle so will get an X-Ray -Left ankle x-ray showed "Bony irregularity at the tip of the medial malleolus likely secondary to the remote fracture seen on radiographs in 08/2015. Difficult to completely exclude superimposed infection. Medial soft tissue  swelling." -Appreciate orthopedics surgery doing a arthrocentesis which showed yellow color synovial fluid, turbid appearance, intracellular monosodium urate crystals, 29,400 WBCs, 92 neutrophils, and 8 monocytes with fluid culture and anaerobic culture pending -We will start colchicine for now and Load and Then continue 0.6 mg BID for acute Flare -Avoid NSAIDS due to Renal Fxn -Continue to monitor treatment response to interventions and adjust prednisone and medications as necessary  Urinary Hesitancy -Patient was having a hard time urinating today we will obtain a bladder scan and renal ultrasound -Bladded U/S showed "Negative for hydronephrosis.  No acute abnormality. Single simple cysts are seen in each kidney." -I doubt this is a urinary tract infection given patient's recent negative UA on admission -Daughter asked about if this  could be prostatitis however I doubt this and I would not do it prostate exam on this patient given his febrile neutropenia  DVT prophylaxis: SCDs Code Status: Full Code Family Communication: Discussed with Daughter at bedside  Disposition Plan: Remain Inpatient for continued workup and treatment and PT/OT recommending SNF  Consultants:   Medical Hematology  Palliative Care Medicine  Infectious Diseases  Orthopedic Surgery   Procedures:  LE VENOUS DUPLEX Right: There is no evidence of deep vein thrombosis in the lower extremity. No cystic structure found in the popliteal fossa. Left: There is no evidence of deep vein thrombosis in the lower extremity. No cystic structure found in the popliteal fossa.    Antimicrobials: Anti-infectives (From admission, onward)   Start     Dose/Rate Route Frequency Ordered Stop   01/07/19 1000  meropenem (MERREM) 1 g in sodium chloride 0.9 % 100 mL IVPB  Status:  Discontinued     1 g 200 mL/hr over 30 Minutes Intravenous Every 12 hours 01/07/19 0909 01/07/19 0937   01/06/19 0800  vancomycin (VANCOCIN) 1,500 mg in sodium chloride 0.9 % 500 mL IVPB  Status:  Discontinued     1,500 mg 250 mL/hr over 120 Minutes Intravenous Every 24 hours 01/05/19 1618 01/05/19 1710   01/05/19 2000  doxycycline (VIBRAMYCIN) 100 mg in sodium chloride 0.9 % 250 mL IVPB  Status:  Discontinued     100 mg 125 mL/hr over 120 Minutes Intravenous Every 12 hours 01/05/19 1825 01/07/19 0937   01/05/19 1800  meropenem (MERREM) 1 g in sodium chloride 0.9 % 100 mL IVPB  Status:  Discontinued     1 g 200 mL/hr over 30 Minutes Intravenous Every 8 hours 01/05/19 0907 01/07/19 0909   01/05/19 0930  meropenem (MERREM) 1 g in sodium chloride 0.9 % 100 mL IVPB     1 g 200 mL/hr over 30 Minutes Intravenous STAT 01/05/19 0906 01/05/19 0951   01/04/19 1200  vancomycin (VANCOCIN) 1,250 mg in sodium chloride 0.9 % 250 mL IVPB  Status:  Discontinued     1,250 mg 166.7 mL/hr over 90  Minutes Intravenous Every 24 hours 01/03/19 0657 01/05/19 1618   01/03/19 0700  vancomycin (VANCOCIN) 2,000 mg in sodium chloride 0.9 % 500 mL IVPB     2,000 mg 250 mL/hr over 120 Minutes Intravenous  Once 01/03/19 0657 01/03/19 1350   01/03/19 0600  azithromycin (ZITHROMAX) 500 mg in sodium chloride 0.9 % 250 mL IVPB  Status:  Discontinued     500 mg 250 mL/hr over 60 Minutes Intravenous Every 24 hours 01/03/19 0521 01/05/19 1825   01/03/19 0600  ceFEPIme (MAXIPIME) 2 g in sodium chloride 0.9 % 100 mL IVPB  Status:  Discontinued  2 g 200 mL/hr over 30 Minutes Intravenous Every 12 hours 01/03/19 0529 01/05/19 0808   01/02/19 2230  piperacillin-tazobactam (ZOSYN) IVPB 3.375 g     3.375 g 100 mL/hr over 30 Minutes Intravenous  Once 01/02/19 2220 01/03/19 0136     Subjective: Seen and examined this morning and states that he was doing "in between."  Complaining of left ankle swelling and pain.  No nausea or vomiting but states he becomes short of breath when he ambulates or exerts himself.  Continues to spike temperatures.  No other concerns or points at this time and answered daughter's questions to her satisfaction.  Objective: Vitals:   01/07/19 1054 01/07/19 1204 01/07/19 1313 01/07/19 1347  BP:  116/63  122/66  Pulse:  93  83  Resp:  18  18  Temp: 100 F (37.8 C) 99.8 F (37.7 C) 98.5 F (36.9 C) 98.3 F (36.8 C)  TempSrc:  Oral Oral Oral  SpO2:  95%    Weight:      Height:        Intake/Output Summary (Last 24 hours) at 01/07/2019 1628 Last data filed at 01/07/2019 1500 Gross per 24 hour  Intake 2346.64 ml  Output 2450 ml  Net -103.36 ml   Filed Weights   01/03/19 0437  Weight: 90.7 kg   Examination: Physical Exam:  Constitutional: Elderly Caucasian male currently in mild distress appears calm but still little confused and looks uncomfortable Eyes: Lids and conjunctivae normal, sclerae anicteric  ENMT: External Ears, Nose appear normal. Grossly normal hearing.  Mucous membranes are moist.  Neck: Appears normal, supple, no cervical masses, normal ROM, no appreciable thyromegaly; no JVD Respiratory: Diminished to auscultation bilaterally, no wheezing, rales, rhonchi or crackles. Normal respiratory effort and patient is not tachypenic. No accessory muscle use.  Cardiovascular: RRR, no murmurs / rubs / gallops. S1 and S2 auscultated. 1+ LE extremity edema bilaterally with swollen left Ankle which is warm compared to the Right. .  Abdomen: Soft, non-tender, Distended. . Bowel sounds positive x4.  GU: Deferred. Musculoskeletal: No clubbing / cyanosis of digits/nails.  Has some left ankle swelling medially with warmth but no erythema Skin: No rashes, lesions, ulcers on limited skin evaluation. No induration; Warm and dry.  Neurologic: CN 2-12 grossly intact with no focal deficits. Romberg sign and cerebellar reflexes not assessed.  Psychiatric: Normal judgment and insight. Alert and awake. Appears to have an anxious mood and appropriate affect.   Data Reviewed: I have personally reviewed following labs and imaging studies  CBC: Recent Labs  Lab 01/03/19 0559 01/04/19 0539 01/05/19 0521 01/06/19 0542 01/07/19 0518  WBC 1.0* 0.7* 2.9* 2.7* 2.2*  NEUTROABS 0.6* 0.4* 2.5 2.0 1.1*  HGB 7.3* 8.2* 9.6* 9.5* 9.5*  HCT 22.9* 24.6* 28.9* 29.1* 28.6*  MCV 101.8* 98.4 98.6 100.3* 99.0  PLT 123* 121* 132* 139* 627*   Basic Metabolic Panel: Recent Labs  Lab 01/03/19 0559 01/04/19 0539 01/05/19 0521 01/06/19 0542 01/07/19 0518  NA 135 136 138 134* 133*  K 4.0 3.9 4.2 4.2 3.9  CL 110 109 106 103 100  CO2 16* 19* 23 18* 24  GLUCOSE 114* 101* 87 100* 159*  BUN 68* 52* 31* 26* 27*  CREATININE 1.58* 1.31* 1.17 1.30* 1.52*  CALCIUM 7.5* 7.4* 7.9* 7.6* 7.6*  MG  --  2.1 1.9 1.7 1.9  PHOS  --  3.1 1.8* 2.7 2.3*   GFR: Estimated Creatinine Clearance: 44.3 mL/min (A) (by C-G formula based on SCr of  1.52 mg/dL (H)). Liver Function Tests: Recent Labs  Lab  01/03/19 0559 01/04/19 0539 01/05/19 0521 01/06/19 0542 01/07/19 0518  AST 16 15 16  13* 14*  ALT 23 19 20 18 17   ALKPHOS 35* 29* 35* 34* 34*  BILITOT 0.6 0.7 0.6 0.8 0.8  PROT 4.4* 4.7* 5.3* 5.2* 5.3*  ALBUMIN 2.4* 2.5* 2.6* 2.4* 2.4*   No results for input(s): LIPASE, AMYLASE in the last 168 hours. No results for input(s): AMMONIA in the last 168 hours. Coagulation Profile: Recent Labs  Lab 01/02/19 2219  INR 1.2   Cardiac Enzymes: No results for input(s): CKTOTAL, CKMB, CKMBINDEX, TROPONINI in the last 168 hours. BNP (last 3 results) No results for input(s): PROBNP in the last 8760 hours. HbA1C: No results for input(s): HGBA1C in the last 72 hours. CBG: Recent Labs  Lab 01/06/19 1149 01/06/19 1615 01/06/19 1944 01/07/19 0745 01/07/19 1201  GLUCAP 259* 223* 213* 146* 214*   Lipid Profile: No results for input(s): CHOL, HDL, LDLCALC, TRIG, CHOLHDL, LDLDIRECT in the last 72 hours. Thyroid Function Tests: Recent Labs    01/05/19 0915  FREET4 1.55*   Anemia Panel: No results for input(s): VITAMINB12, FOLATE, FERRITIN, TIBC, IRON, RETICCTPCT in the last 72 hours. Sepsis Labs: Recent Labs  Lab 01/02/19 2219 01/03/19 0046  LATICACIDVEN 0.9 0.8    Recent Results (from the past 240 hour(s))  Blood Culture (routine x 2)     Status: None (Preliminary result)   Collection Time: 01/02/19 10:49 PM   Specimen: BLOOD  Result Value Ref Range Status   Specimen Description   Final    BLOOD RIGHT ANTECUBITAL Performed at [facility] [address] [place], [place] [address]    Special Requests   Final    BOTTLES DRAWN AEROBIC AND ANAEROBIC Blood Culture adequate volume Performed at [facility] [address] [place], [place] [address]    Culture   Final    NO GROWTH 4 DAYS Performed at [facility], [place] [address] [place], [place] [address]    Report Status PENDING  Incomplete  Blood Culture (routine x 2)      Status: None (Preliminary result)   Collection Time: 01/02/19 10:49 PM   Specimen: BLOOD  Result Value Ref Range Status   Specimen Description   Final    BLOOD LEFT ANTECUBITAL Performed at [facility] [address] [place], [place] [address]    Special Requests   Final    BOTTLES DRAWN AEROBIC AND ANAEROBIC Blood Culture adequate volume Performed at [facility] [address], [place], [place] [address]    Culture   Final    NO GROWTH 4 DAYS Performed at [facility], [place] [address] [place], [place] [address]    Report Status PENDING  Incomplete  Urine culture     Status: None   Collection Time: 01/03/19 12:46 AM   Specimen: In/Out Cath Urine  Result Value Ref Range Status   Specimen Description   Final    IN/OUT CATH URINE Performed at [facility] [address] [place], [place] [address]    Special Requests   Final    NONE Performed at [facility], [place] [address], [place], [place] [address]    Culture   Final    NO GROWTH Performed at [facility], [place] [address], [place], [place] [address]    Report Status 01/04/2019 FINAL  Final  SARS Coronavirus 2 [facility] order, Performed in [facility] hospital lab) Nasopharyngeal Nasopharyngeal Swab     Status: None   Collection Time: 01/03/19 12:46 AM   Specimen: Nasopharyngeal Swab  Result Value Ref Range Status   SARS Coronavirus 2 NEGATIVE NEGATIVE Final    Comment: (NOTE) If result is NEGATIVE SARS-CoV-2 target nucleic acids are NOT DETECTED. The SARS-CoV-2 RNA is generally detectable in upper and lower  respiratory specimens during the acute phase of infection. The lowest  concentration of SARS-CoV-2 viral copies this assay can detect is 250  copies / mL. A negative result does not preclude SARS-CoV-2 infection  and should not be used as the sole basis for treatment or other  patient management decisions.  A negative result  may occur with  improper specimen collection / handling, submission of specimen other  than nasopharyngeal swab, presence of viral mutation(s) within the  areas targeted by this assay, and inadequate number of viral copies  (<250 copies / mL). A negative result must be combined with clinical  observations, patient history, and epidemiological information. If result is POSITIVE SARS-CoV-2 target nucleic acids are DETECTED. The SARS-CoV-2 RNA is generally detectable in upper and lower  respiratory specimens dur ing the acute phase of infection.  Positive  results are indicative of active infection with SARS-CoV-2.  Clinical  correlation with patient history and other diagnostic information is  necessary to determine patient infection status.  Positive results do  not rule out bacterial infection or co-infection with other viruses. If result is PRESUMPTIVE POSTIVE SARS-CoV-2 nucleic acids MAY BE PRESENT.   A presumptive positive result was obtained on the submitted specimen  and confirmed on repeat testing.  While 2019 novel coronavirus  (SARS-CoV-2) nucleic acids may be present in the submitted sample  additional confirmatory testing may be necessary for epidemiological  and / or clinical management purposes  to differentiate between  SARS-CoV-2 and other Sarbecovirus currently known to infect humans.  If clinically indicated additional testing with an alternate test  methodology [phone]) is advised. The SARS-CoV-2 RNA is generally  detectable in upper and lower respiratory sp ecimens during the acute  phase of infection. The expected result is Negative. Fact Sheet for Patients:  StrictlyIdeas.no Fact Sheet for Healthcare Providers: BankingDealers.co.za This test is not yet approved or cleared by the [place] FDA and has been authorized for detection and/or diagnosis of SARS-CoV-2 by FDA under an Emergency Use Authorization (EUA).   This EUA will remain in effect (meaning this test can be used) for the duration of the COVID-19 declaration under Section 564(b)(1) of the Act, 21 U.S.C. section 360bbb-3(b)(1), unless the authorization is terminated or revoked sooner. Performed at [facility], [place] [address] [place], [place] [address]   MRSA PCR Screening     Status: None   Collection Time: 01/04/19  4:59 PM   Specimen: Nasal Mucosa; Nasopharyngeal  Result Value Ref Range Status   MRSA by PCR NEGATIVE NEGATIVE Final    Comment:        The GeneXpert MRSA Assay (FDA approved for NASAL specimens only), is one component of a comprehensive MRSA colonization surveillance program. It is not intended to diagnose MRSA infection nor to guide or monitor treatment for MRSA infections. Performed at [facility], [place] [address] [place], [place] [address]   Fungus culture, blood     Status: None (Preliminary result)   Collection Time: 01/05/19  2:29 PM   Specimen: BLOOD  Result Value Ref  Range Status   Specimen Description   Final    BLOOD BLOOD RIGHT HAND Performed at [facility] [address] [place], [place] [address]    Special Requests   Final    BOTTLES DRAWN AEROBIC ONLY Blood Culture adequate volume Performed at [facility] [address] [place], [place] [address]    Culture   Final    NO GROWTH 2 DAYS Performed at [facility] [address] [place], [place] [address]    Report Status PENDING  Incomplete  Body fluid culture     Status: None (Preliminary result)   Collection Time: 01/07/19 11:47 AM   Specimen: Synovium; Synovial Fluid  Result Value Ref Range Status   Specimen Description SYNOVIAL LEFT ANKLE  Final   Special Requests Immunocompromised  Final   Gram Stain   Final    WBC PRESENT, PREDOMINANTLY PMN NO ORGANISMS SEEN CYTOSPIN Gram Stain Report Called to,Read Back By and Verified With: Jorge Ny RN  ON 01/07/19 @ 1310 BY LE Performed at [facility], [place] [address] [place], [place] [address]    Culture PENDING  Incomplete   Report Status PENDING  Incomplete    Radiology Studies: Ct Abdomen Pelvis Wo Contrast  Result Date: 01/06/2019 CLINICAL DATA:  Fever, metastatic melanoma EXAM: CT CHEST, ABDOMEN AND PELVIS WITHOUT CONTRAST TECHNIQUE: Multidetector CT imaging of the chest, abdomen and pelvis was performed following the standard protocol without IV contrast. COMPARISON:  PET-CT, 09/18/2018 FINDINGS: CT CHEST FINDINGS Cardiovascular: Aortic atherosclerosis. Normal heart size. Three-vessel coronary artery calcifications and/or stents status post median sternotomy and CABG. Left chest multi lead pacer. No pericardial effusion. Mediastinum/Nodes: No enlarged mediastinal, hilar, or axillary lymph nodes. Thyroid gland, trachea, and esophagus demonstrate no significant findings. Lungs/Pleura: Small bilateral pleural effusions with associated atelectasis or consolidation. There is a small, stable, calcified benign nodule of the lingula measuring 5 mm (series 6, image 87). Musculoskeletal: No chest wall mass or suspicious bone lesions identified. CT ABDOMEN PELVIS FINDINGS Hepatobiliary: No solid liver abnormality is seen. No gallstones, gallbladder wall thickening, or biliary dilatation. Pancreas: Unremarkable. No pancreatic ductal dilatation or surrounding inflammatory changes. Spleen: Normal in size without significant abnormality. Adrenals/Urinary Tract: Adrenal glands are unremarkable. Multiple low-attenuation lesions of the kidneys, incompletely characterized although likely cysts. Bladder is unremarkable. Stomach/Bowel: Stomach is within normal limits. Appendix appears normal. No evidence of bowel wall thickening, distention, or inflammatory changes. Pancolonic diverticulosis. Moderate burden of stool colon. Vascular/Lymphatic: Tortuous and severely atherosclerotic aorta, measuring  up to 3.1 x 2.9 cm in the infrarenal portion of the vessel. No enlarged abdominal or pelvic lymph nodes. Reproductive: No mass or other abnormality. Other: No abdominal wall hernia or abnormality. No abdominopelvic ascites. Musculoskeletal: Levoscoliosis of the lumbar spine with associated disc degenerative disease. IMPRESSION: 1. Small bilateral pleural effusions with associated atelectasis or consolidation. 2. No definite CT findings of the chest, abdomen, or pelvis to explain fevers. 3. No noncontrast CT evidence of metastatic disease in the chest, abdomen, or pelvis. 4. Redemonstrated infrarenal abdominal aortic aneurysm measuring up to 3.1 cm. Recommend followup by ultrasound in 3 years. This recommendation follows ACR consensus guidelines: White Paper of the ACR Incidental Findings Committee II on Vascular Findings. J Am Coll Radiol 2013; 10:789-794. Aortic aneurysm NOS (ICD10-I71.9) 5.  Coronary artery disease.  Aortic Atherosclerosis (ICD10-I70.0). Electronically Signed   By: Eddie Candle M.D.   On: 01/06/2019 08:16   Dg Ankle 2 Views Left  Result Date:  01/06/2019 CLINICAL DATA:  Concern for infection of left ankle, swelling and redness noted to medial aspect of left ankle. H/o metastatic melanoma. H/o gout. EXAM: LEFT ANKLE - 2 VIEW COMPARISON:  Left ankle radiographs 08/27/2015 FINDINGS: There is bony irregularity at the tip of the medial malleolus which may be secondary to the prior fracture seen on radiograph from 08/28/2015. No other focal bony abnormality identified in the ankle. The ankle mortise is intact. The tibiotalar joint space is preserved. Calcaneal spurring. There is vascular calcification in the soft tissues. There is medial soft tissue swelling. IMPRESSION: 1. Bony irregularity at the tip of the medial malleolus likely secondary to the remote fracture seen on radiographs in 08/2015. Difficult to completely exclude superimposed infection. 2. Medial soft tissue swelling. Electronically  Signed   By: Audie Pinto M.D.   On: 01/06/2019 19:11   Ct Chest Wo Contrast  Result Date: 01/06/2019 CLINICAL DATA:  Fever, metastatic melanoma EXAM: CT CHEST, ABDOMEN AND PELVIS WITHOUT CONTRAST TECHNIQUE: Multidetector CT imaging of the chest, abdomen and pelvis was performed following the standard protocol without IV contrast. COMPARISON:  PET-CT, 09/18/2018 FINDINGS: CT CHEST FINDINGS Cardiovascular: Aortic atherosclerosis. Normal heart size. Three-vessel coronary artery calcifications and/or stents status post median sternotomy and CABG. Left chest multi lead pacer. No pericardial effusion. Mediastinum/Nodes: No enlarged mediastinal, hilar, or axillary lymph nodes. Thyroid gland, trachea, and esophagus demonstrate no significant findings. Lungs/Pleura: Small bilateral pleural effusions with associated atelectasis or consolidation. There is a small, stable, calcified benign nodule of the lingula measuring 5 mm (series 6, image 87). Musculoskeletal: No chest wall mass or suspicious bone lesions identified. CT ABDOMEN PELVIS FINDINGS Hepatobiliary: No solid liver abnormality is seen. No gallstones, gallbladder wall thickening, or biliary dilatation. Pancreas: Unremarkable. No pancreatic ductal dilatation or surrounding inflammatory changes. Spleen: Normal in size without significant abnormality. Adrenals/Urinary Tract: Adrenal glands are unremarkable. Multiple low-attenuation lesions of the kidneys, incompletely characterized although likely cysts. Bladder is unremarkable. Stomach/Bowel: Stomach is within normal limits. Appendix appears normal. No evidence of bowel wall thickening, distention, or inflammatory changes. Pancolonic diverticulosis. Moderate burden of stool colon. Vascular/Lymphatic: Tortuous and severely atherosclerotic aorta, measuring up to 3.1 x 2.9 cm in the infrarenal portion of the vessel. No enlarged abdominal or pelvic lymph nodes. Reproductive: No mass or other abnormality. Other:  No abdominal wall hernia or abnormality. No abdominopelvic ascites. Musculoskeletal: Levoscoliosis of the lumbar spine with associated disc degenerative disease. IMPRESSION: 1. Small bilateral pleural effusions with associated atelectasis or consolidation. 2. No definite CT findings of the chest, abdomen, or pelvis to explain fevers. 3. No noncontrast CT evidence of metastatic disease in the chest, abdomen, or pelvis. 4. Redemonstrated infrarenal abdominal aortic aneurysm measuring up to 3.1 cm. Recommend followup by ultrasound in 3 years. This recommendation follows ACR consensus guidelines: White Paper of the ACR Incidental Findings Committee II on Vascular Findings. J Am Coll Radiol 2013; 10:789-794. Aortic aneurysm NOS (ICD10-I71.9) 5.  Coronary artery disease.  Aortic Atherosclerosis (ICD10-I70.0). Electronically Signed   By: Eddie Candle M.D.   On: 01/06/2019 08:16   US Renal  Result Date: 01/07/2019 CLINICAL DATA:  Acute kidney injury.  Urinary retention. EXAM: RENAL / URINARY TRACT ULTRASOUND COMPLETE COMPARISON:  CT chest, abdomen and pelvis 01/05/2019. FINDINGS: Right Kidney: Renal measurements: 10.1 x 5.4 x 4.7 cm = volume: 135.1 mL . Echogenicity within normal limits. No solid mass or hydronephrosis visualized. 2.8 cm simple cyst noted. Left Kidney: Renal measurements: 12.4 x 5.5 x 5.0 cm = volume: 178.4  mL. Echogenicity within normal limits. No solid mass or hydronephrosis visualized. Simple 4.1 cm cyst noted. Bladder: Appears normal for degree of bladder distention. IMPRESSION: Negative for hydronephrosis.  No acute abnormality. Single simple cysts are seen in each kidney. Electronically Signed   By: Inge Rise M.D.   On: 01/07/2019 14:26   Vas [place] Lower Extremity Venous (dvt)  Result Date: 01/07/2019  Lower Venous Study Indications: Swelling.  Limitations: Poor ultrasound/tissue interface. Comparison Study: No prior study. Performing Technologist: Maudry Mayhew MHA, RDMS, RVT, RDCS   Examination Guidelines: A complete evaluation includes B-mode imaging, spectral Doppler, color Doppler, and power Doppler as needed of all accessible portions of each vessel. Bilateral testing is considered an integral part of a complete examination. Limited examinations for reoccurring indications may be performed as noted.  +---------+---------------+---------+-----------+----------+--------------+  RIGHT     Compressibility Phasicity Spontaneity Properties Thrombus Aging  +---------+---------------+---------+-----------+----------+--------------+  CFV       Full            Yes       Yes                                    +---------+---------------+---------+-----------+----------+--------------+  SFJ       Full                                                             +---------+---------------+---------+-----------+----------+--------------+  FV Prox   Full                                                             +---------+---------------+---------+-----------+----------+--------------+  FV Mid    Full                                                             +---------+---------------+---------+-----------+----------+--------------+  FV Distal Full                                                             +---------+---------------+---------+-----------+----------+--------------+  PFV       Full                                                             +---------+---------------+---------+-----------+----------+--------------+  POP       Full            Yes       Yes                                    +---------+---------------+---------+-----------+----------+--------------+  PTV       Full                                                             +---------+---------------+---------+-----------+----------+--------------+  PERO      Full                                                             +---------+---------------+---------+-----------+----------+--------------+    +---------+---------------+---------+-----------+----------+--------------+  LEFT      Compressibility Phasicity Spontaneity Properties Thrombus Aging  +---------+---------------+---------+-----------+----------+--------------+  CFV       Full            Yes       Yes                                    +---------+---------------+---------+-----------+----------+--------------+  SFJ       Full                                                             +---------+---------------+---------+-----------+----------+--------------+  FV Prox   Full                                                             +---------+---------------+---------+-----------+----------+--------------+  FV Mid    Full                                                             +---------+---------------+---------+-----------+----------+--------------+  FV Distal Full                                                             +---------+---------------+---------+-----------+----------+--------------+  PFV       Full                                                             +---------+---------------+---------+-----------+----------+--------------+  POP       Full            Yes       Yes                                    +---------+---------------+---------+-----------+----------+--------------+  PTV       Full                                                             +---------+---------------+---------+-----------+----------+--------------+  PERO      Full                                                             +---------+---------------+---------+-----------+----------+--------------+     Summary: Right: There is no evidence of deep vein thrombosis in the lower extremity. No cystic structure found in the popliteal fossa. Left: There is no evidence of deep vein thrombosis in the lower extremity. No cystic structure found in the popliteal fossa.  *See table(s) above for measurements and observations.    Preliminary    Scheduled  Meds:  sodium chloride   Intravenous Once   aspirin EC  81 mg Oral QPM   bupivacaine  10 mL Infiltration Once   citalopram  10 mg Oral Daily   colchicine  0.6 mg Oral BID   enoxaparin (LOVENOX) injection  40 mg Subcutaneous Q24H   gabapentin  100 mg Oral BID   guaiFENesin  1,200 mg Oral BID   insulin aspart  0-9 Units Subcutaneous TID WC   levothyroxine  137 mcg Oral Q0600   methylphenidate  5 mg Oral BID WC   methylPREDNISolone acetate  80 mg Intra-articular Once   polyethylene glycol  17 g Oral BID   [START ON 01/08/2019] predniSONE  60 mg Oral Q breakfast   senna-docusate  1 tablet Oral BID   Continuous Infusions:  sodium bicarbonate 150 mEq in dextrose 5% 1000 mL 50 mL/hr at 01/07/19 1500    LOS: 4 days   Kerney Elbe, DO [facility] PAGER is on AMION  If 7PM-7AM, please contact night-coverage [email or web address] Password [facility] 01/07/2019, 4:28 PM

## 2019-01-07 NOTE — Consult Note (Addendum)
Reason for Consult:Ankle pain Referring Physician: Axel Filler David [name]. is an 77 y.o. male.  HPI: David [name] was admitted 4d ago with febrile neutropenia. During this time he's also been c/o ankle pain, L>R associated with swelling and redness. According to his daughter the severity seems to wax and wane with his steroid use but never completely goes away. He does have a history of gout that often affects his lower extremities. ID has weighed in and did not think the ankle was septic but the hospitalist requested orthopedic evaluation and aspiration.  Past Medical History:  Diagnosis Date  . Anginal pain ([place])   . Arthritis    mostly hands  . Benign familial hematuria   . BPH (benign prostatic hypertrophy)   . Brain cancer ([place])    melanoma with brain met  . Coronary artery disease   . GERD (gastroesophageal reflux disease)   . High cholesterol   . Hypertension   . Hypothyroidism   . Intracerebral hemorrhage ([place])   . Melanoma ([place])   . Metastatic melanoma to lung ([place])   . Mood disorder ([place])   . Myocardial infarction ([place])   . Pacemaker    due to syncope, 3rd degree HB (upgrade to [facility]. Jude CRT-P 02/25/13 (Dr. Uvaldo Rising)  . Rectal bleed    due to NSAIDS  . Renal disorder   . Sepsis ([place]) 12/09/2018  . Skin cancer    melanoma  . Sleep apnea    uses CPAP  . Stroke [facility])     Past Surgical History:  Procedure Laterality Date  . APPLICATION OF CRANIAL NAVIGATION N/A 10/15/2015   Procedure: APPLICATION OF CRANIAL NAVIGATION;  Surgeon: Erline Levine, MD;  Location: [place] NEURO ORS;  Service: Neurosurgery;  Laterality: N/A;  . CARDIAC CATHETERIZATION  2013  . CARDIAC SURGERY     bypass X 2  . COLONOSCOPY WITH PROPOFOL N/A 05/08/2016   diverticulosis, int hem, no f/u needed [name])  . CORONARY ARTERY BYPASS GRAFT  2013   LIMA-LAD, SVG-PDA 03/27/11 (Dr. Francee Gentile)  . CRANIOTOMY N/A 10/15/2015   Procedure: LEFT FRONTAL CRANIOTOMY TUMOR EXCISION with Curve;  Surgeon: Erline Levine,  MD;  Location: [place] NEURO ORS;  Service: Neurosurgery;  Laterality: N/A;  CRANIOTOMY TUMOR EXCISION  . EYE SURGERY    . FOOT SURGERY  08/2010  . JOINT REPLACEMENT Left    partial knee  . KNEE ARTHROSCOPY  12/2008  . LYMPH NODE BIOPSY    . PACEMAKER INSERTION    . TONSILLECTOMY      Family History  Problem Relation Age of Onset  . Cancer Mother 62       lung   . Hypertension Mother   . Hypertension Father   . Heart attack Father   . Heart disease Father   . Rheum arthritis Sister   . Cancer Maternal Grandmother     Social History:  reports that he quit smoking about 61 years ago. He has a 3.00 pack-year smoking history. He has never used smokeless tobacco. He reports current alcohol use of about 14.0 standard drinks of alcohol per week. He reports that he does not use drugs.  Allergies:  Allergies  Allergen Reactions  . Ezetimibe Other (See Comments)    Unknown reaction  . Simvastatin Other (See Comments)    Reaction:  Gave pt a fever  Fever - temp of 103.   In 2003    Medications: I have reviewed the patient's current medications.  Results for orders placed or  performed during the hospital encounter of 01/02/19 (from the past 48 hour(s))  Glucose, capillary     Status: Abnormal   Collection Time: 01/05/19 12:04 PM  Result Value Ref Range   Glucose-Capillary 143 (H) 70 - 99 mg/dL   Comment 1 Notify RN   Fungus culture, blood     Status: None (Preliminary result)   Collection Time: 01/05/19  2:29 PM   Specimen: BLOOD  Result Value Ref Range   Specimen Description      BLOOD BLOOD RIGHT HAND Performed at [facility] [address], [place], [place] [address]    Special Requests      BOTTLES DRAWN AEROBIC ONLY Blood Culture adequate volume Performed at [facility] [address], [place], [place] [address]    Culture      NO GROWTH 2 DAYS Performed at [facility], [place] [address], [place], [place] [address]    Report  Status PENDING   Glucose, capillary     Status: Abnormal   Collection Time: 01/05/19  4:50 PM  Result Value Ref Range   Glucose-Capillary 110 (H) 70 - 99 mg/dL   Comment 1 Notify RN   Glucose, capillary     Status: Abnormal   Collection Time: 01/05/19  9:02 PM  Result Value Ref Range   Glucose-Capillary 116 (H) 70 - 99 mg/dL  Magnesium     Status: None   Collection Time: 01/06/19  5:42 AM  Result Value Ref Range   Magnesium 1.7 1.7 - 2.4 mg/dL    Comment: Performed at [facility], [place] [address], [place], [place] [address]  Phosphorus     Status: None   Collection Time: 01/06/19  5:42 AM  Result Value Ref Range   Phosphorus 2.7 2.5 - 4.6 mg/dL    Comment: Performed at [facility], [place] [address] [place], [place] [address]  Comprehensive metabolic panel     Status: Abnormal   Collection Time: 01/06/19  5:42 AM  Result Value Ref Range   Sodium 134 (L) 135 - 145 mmol/L   Potassium 4.2 3.5 - 5.1 mmol/L   Chloride 103 98 - 111 mmol/L   CO2 18 (L) 22 - 32 mmol/L   Glucose, Bld 100 (H) 70 - 99 mg/dL   BUN 26 (H) 8 - 23 mg/dL   Creatinine, Ser 1.30 (H) 0.61 - 1.24 mg/dL   Calcium 7.6 (L) 8.9 - 10.3 mg/dL   Total Protein 5.2 (L) 6.5 - 8.1 g/dL   Albumin 2.4 (L) 3.5 - 5.0 g/dL   AST 13 (L) 15 - 41 U/L   ALT 18 0 - 44 U/L   Alkaline Phosphatase 34 (L) 38 - 126 U/L   Total Bilirubin 0.8 0.3 - 1.2 mg/dL   GFR calc non Af Amer 52 (L) >60 mL/min   GFR calc Af Amer >60 >60 mL/min   Anion gap 13 5 - 15    Comment: Performed at [facility], [place] [address], [place],  [address]  CBC with Differential/Platelet     Status: Abnormal   Collection Time: 01/06/19  5:42 AM  Result Value Ref Range   WBC 2.7 (L) 4.0 - 10.5 K/uL   RBC 2.90 (L) 4.22 - 5.81 MIL/uL   Hemoglobin 9.5 (L) 13.0 - 17.0 g/dL   HCT 29.1 (L) 39.0 - 52.0 %   MCV 100.3 (H) 80.0 - 100.0 fL   MCH 32.8 26.0 - 34.0  pg   MCHC 32.6 30.0 - 36.0 g/dL   RDW 15.8 (H)  11.5 - 15.5 %   Platelets 139 (L) 150 - 400 K/uL    Comment: Immature Platelet Fraction may be clinically indicated, consider ordering this additional test [id number]    nRBC 0.0 0.0 - 0.2 %   Neutrophils Relative % 74 %   Neutro Abs 2.0 1.7 - 7.7 K/uL   Lymphocytes Relative 12 %   Lymphs Abs 0.3 (L) 0.7 - 4.0 K/uL   Monocytes Relative 10 %   Monocytes Absolute 0.3 0.1 - 1.0 K/uL   Eosinophils Relative 2 %   Eosinophils Absolute 0.1 0.0 - 0.5 K/uL   Basophils Relative 0 %   Basophils Absolute 0.0 0.0 - 0.1 K/uL   WBC Morphology MILD LEFT SHIFT (1-5% METAS, OCC MYELO, OCC BANDS)    Metamyelocytes Relative 2 %   Abs Immature Granulocytes 0.10 (H) 0.00 - 0.07 K/uL   Polychromasia PRESENT     Comment: Performed at [facility], [place] [address], [place], [place] [address]  Uric acid     Status: None   Collection Time: 01/06/19  5:42 AM  Result Value Ref Range   Uric Acid, Serum 6.6 3.7 - 8.6 mg/dL    Comment: Performed at [facility], [place] [address] [place], [place] [address]  Glucose, capillary     Status: None   Collection Time: 01/06/19  7:35 AM  Result Value Ref Range   Glucose-Capillary 90 70 - 99 mg/dL  Glucose, capillary     Status: Abnormal   Collection Time: 01/06/19 11:49 AM  Result Value Ref Range   Glucose-Capillary 259 (H) 70 - 99 mg/dL   Comment 1 Notify RN    Comment 2 Document in Chart   Glucose, capillary     Status: Abnormal   Collection Time: 01/06/19  4:15 PM  Result Value Ref Range   Glucose-Capillary 223 (H) 70 - 99 mg/dL   Comment 1 Notify RN    Comment 2 Document in Chart   Glucose, capillary     Status: Abnormal   Collection Time: 01/06/19  7:44 PM  Result Value Ref Range   Glucose-Capillary 213 (H) 70 - 99 mg/dL  Magnesium     Status: None   Collection Time: 01/07/19  5:18 AM  Result Value Ref Range   Magnesium 1.9 1.7 - 2.4 mg/dL    Comment: Performed at [facility], [place] [address], [place], [place] [address]  Phosphorus     Status: Abnormal   Collection Time: 01/07/19  5:18 AM  Result Value Ref Range   Phosphorus 2.3 (L) 2.5 - 4.6 mg/dL    Comment: Performed at [facility], [place] [address], [place], [place] [address]  Comprehensive metabolic panel     Status: Abnormal   Collection Time: 01/07/19  5:18 AM  Result Value Ref Range   Sodium 133 (L) 135 - 145 mmol/L   Potassium 3.9 3.5 - 5.1 mmol/L   Chloride 100 98 - 111 mmol/L   CO2 24 22 - 32 mmol/L   Glucose, Bld 159 (H) 70 - 99 mg/dL   BUN 27 (H) 8 - 23 mg/dL   Creatinine, Ser 1.52 (H) 0.61 - 1.24 mg/dL   Calcium 7.6 (L) 8.9 - 10.3 mg/dL   Total Protein 5.3 (L) 6.5 - 8.1 g/dL   Albumin 2.4 (L) 3.5 - 5.0 g/dL   AST 14 (L) 15 - 41  U/L   ALT 17 0 - 44 U/L   Alkaline Phosphatase 34 (L) 38 - 126 U/L   Total Bilirubin 0.8 0.3 - 1.2 mg/dL   GFR calc non Af Amer 43 (L) >60 mL/min   GFR calc Af Amer 50 (L) >60 mL/min   Anion gap 9 5 - 15    Comment: Performed at [facility], [place] [address] [place], [place] [address]  CBC with Differential/Platelet     Status: Abnormal   Collection Time: 01/07/19  5:18 AM  Result Value Ref Range   WBC 2.2 (L) 4.0 - 10.5 K/uL   RBC 2.89 (L) 4.22 - 5.81 MIL/uL   Hemoglobin 9.5 (L) 13.0 - 17.0 g/dL   HCT 28.6 (L) 39.0 - 52.0 %   MCV 99.0 80.0 - 100.0 fL   MCH 32.9 26.0 - 34.0 pg   MCHC 33.2 30.0 - 36.0 g/dL   RDW 15.4 11.5 - 15.5 %   Platelets 125 (L) 150 - 400 K/uL    Comment: Immature Platelet Fraction may be clinically indicated, consider ordering this additional test [id number]    nRBC 0.0 0.0 - 0.2 %   Neutrophils Relative % 51 %   Neutro Abs 1.1 (L) 1.7 - 7.7 K/uL   Lymphocytes Relative 19 %   Lymphs Abs 0.4 (L) 0.7 - 4.0 K/uL   Monocytes Relative 28 %   Monocytes Absolute 0.6 0.1 - 1.0 K/uL   Eosinophils Relative 2 %   Eosinophils Absolute 0.0 0.0 - 0.5 K/uL   Basophils Relative 0 %   Basophils Absolute 0.0 0.0 - 0.1 K/uL    Abs Immature Granulocytes 0.00 0.00 - 0.07 K/uL   Polychromasia PRESENT     Comment: Performed at [facility], [place] [address] [place], [place] [address]  Glucose, capillary     Status: Abnormal   Collection Time: 01/07/19  7:45 AM  Result Value Ref Range   Glucose-Capillary 146 (H) 70 - 99 mg/dL    Ct Abdomen Pelvis Wo Contrast  Result Date: 01/06/2019 CLINICAL DATA:  Fever, metastatic melanoma EXAM: CT CHEST, ABDOMEN AND PELVIS WITHOUT CONTRAST TECHNIQUE: Multidetector CT imaging of the chest, abdomen and pelvis was performed following the standard protocol without IV contrast. COMPARISON:  PET-CT, 09/18/2018 FINDINGS: CT CHEST FINDINGS Cardiovascular: Aortic atherosclerosis. Normal heart size. Three-vessel coronary artery calcifications and/or stents status post median sternotomy and CABG. Left chest multi lead pacer. No pericardial effusion. Mediastinum/Nodes: No enlarged mediastinal, hilar, or axillary lymph nodes. Thyroid gland, trachea, and esophagus demonstrate no significant findings. Lungs/Pleura: Small bilateral pleural effusions with associated atelectasis or consolidation. There is a small, stable, calcified benign nodule of the lingula measuring 5 mm (series 6, image 87). Musculoskeletal: No chest wall mass or suspicious bone lesions identified. CT ABDOMEN PELVIS FINDINGS Hepatobiliary: No solid liver abnormality is seen. No gallstones, gallbladder wall thickening, or biliary dilatation. Pancreas: Unremarkable. No pancreatic ductal dilatation or surrounding inflammatory changes. Spleen: Normal in size without significant abnormality. Adrenals/Urinary Tract: Adrenal glands are unremarkable. Multiple low-attenuation lesions of the kidneys, incompletely characterized although likely cysts. Bladder is unremarkable. Stomach/Bowel: Stomach is within normal limits. Appendix appears normal. No evidence of bowel wall thickening, distention, or inflammatory changes. Pancolonic  diverticulosis. Moderate burden of stool colon. Vascular/Lymphatic: Tortuous and severely atherosclerotic aorta, measuring up to 3.1 x 2.9 cm in the infrarenal portion of the vessel. No enlarged abdominal or pelvic lymph nodes. Reproductive: No mass or other abnormality. Other: No abdominal wall hernia or abnormality.  No abdominopelvic ascites. Musculoskeletal: Levoscoliosis of the lumbar spine with associated disc degenerative disease. IMPRESSION: 1. Small bilateral pleural effusions with associated atelectasis or consolidation. 2. No definite CT findings of the chest, abdomen, or pelvis to explain fevers. 3. No noncontrast CT evidence of metastatic disease in the chest, abdomen, or pelvis. 4. Redemonstrated infrarenal abdominal aortic aneurysm measuring up to 3.1 cm. Recommend followup by ultrasound in 3 years. This recommendation follows ACR consensus guidelines: White Paper of the ACR Incidental Findings Committee II on Vascular Findings. J Am Coll Radiol 2013; 10:789-794. Aortic aneurysm NOS (ICD10-I71.9) 5.  Coronary artery disease.  Aortic Atherosclerosis (ICD10-I70.0). Electronically Signed   By: Eddie Candle M.D.   On: 01/06/2019 08:16   Dg Ankle 2 Views Left  Result Date: 01/06/2019 CLINICAL DATA:  Concern for infection of left ankle, swelling and redness noted to medial aspect of left ankle. H/o metastatic melanoma. H/o gout. EXAM: LEFT ANKLE - 2 VIEW COMPARISON:  Left ankle radiographs 08/27/2015 FINDINGS: There is bony irregularity at the tip of the medial malleolus which may be secondary to the prior fracture seen on radiograph from 08/28/2015. No other focal bony abnormality identified in the ankle. The ankle mortise is intact. The tibiotalar joint space is preserved. Calcaneal spurring. There is vascular calcification in the soft tissues. There is medial soft tissue swelling. IMPRESSION: 1. Bony irregularity at the tip of the medial malleolus likely secondary to the remote fracture seen on  radiographs in 08/2015. Difficult to completely exclude superimposed infection. 2. Medial soft tissue swelling. Electronically Signed   By: Audie Pinto M.D.   On: 01/06/2019 19:11   Ct Chest Wo Contrast  Result Date: 01/06/2019 CLINICAL DATA:  Fever, metastatic melanoma EXAM: CT CHEST, ABDOMEN AND PELVIS WITHOUT CONTRAST TECHNIQUE: Multidetector CT imaging of the chest, abdomen and pelvis was performed following the standard protocol without IV contrast. COMPARISON:  PET-CT, 09/18/2018 FINDINGS: CT CHEST FINDINGS Cardiovascular: Aortic atherosclerosis. Normal heart size. Three-vessel coronary artery calcifications and/or stents status post median sternotomy and CABG. Left chest multi lead pacer. No pericardial effusion. Mediastinum/Nodes: No enlarged mediastinal, hilar, or axillary lymph nodes. Thyroid gland, trachea, and esophagus demonstrate no significant findings. Lungs/Pleura: Small bilateral pleural effusions with associated atelectasis or consolidation. There is a small, stable, calcified benign nodule of the lingula measuring 5 mm (series 6, image 87). Musculoskeletal: No chest wall mass or suspicious bone lesions identified. CT ABDOMEN PELVIS FINDINGS Hepatobiliary: No solid liver abnormality is seen. No gallstones, gallbladder wall thickening, or biliary dilatation. Pancreas: Unremarkable. No pancreatic ductal dilatation or surrounding inflammatory changes. Spleen: Normal in size without significant abnormality. Adrenals/Urinary Tract: Adrenal glands are unremarkable. Multiple low-attenuation lesions of the kidneys, incompletely characterized although likely cysts. Bladder is unremarkable. Stomach/Bowel: Stomach is within normal limits. Appendix appears normal. No evidence of bowel wall thickening, distention, or inflammatory changes. Pancolonic diverticulosis. Moderate burden of stool colon. Vascular/Lymphatic: Tortuous and severely atherosclerotic aorta, measuring up to 3.1 x 2.9 cm in the  infrarenal portion of the vessel. No enlarged abdominal or pelvic lymph nodes. Reproductive: No mass or other abnormality. Other: No abdominal wall hernia or abnormality. No abdominopelvic ascites. Musculoskeletal: Levoscoliosis of the lumbar spine with associated disc degenerative disease. IMPRESSION: 1. Small bilateral pleural effusions with associated atelectasis or consolidation. 2. No definite CT findings of the chest, abdomen, or pelvis to explain fevers. 3. No noncontrast CT evidence of metastatic disease in the chest, abdomen, or pelvis. 4. Redemonstrated infrarenal abdominal aortic aneurysm measuring up to 3.1 cm. Recommend followup  by ultrasound in 3 years. This recommendation follows ACR consensus guidelines: White Paper of the ACR Incidental Findings Committee II on Vascular Findings. J Am Coll Radiol 2013; 10:789-794. Aortic aneurysm NOS (ICD10-I71.9) 5.  Coronary artery disease.  Aortic Atherosclerosis (ICD10-I70.0). Electronically Signed   By: Eddie Candle M.D.   On: 01/06/2019 08:16   Vas [place] Lower Extremity Venous (dvt)  Result Date: 01/07/2019  Lower Venous Study Indications: Swelling.  Limitations: Poor ultrasound/tissue interface. Comparison Study: No prior study. Performing Technologist: Maudry Mayhew MHA, RDMS, RVT, RDCS  Examination Guidelines: A complete evaluation includes B-mode imaging, spectral Doppler, color Doppler, and power Doppler as needed of all accessible portions of each vessel. Bilateral testing is considered an integral part of a complete examination. Limited examinations for reoccurring indications may be performed as noted.  +---------+---------------+---------+-----------+----------+--------------+ RIGHT    CompressibilityPhasicitySpontaneityPropertiesThrombus Aging +---------+---------------+---------+-----------+----------+--------------+ CFV      Full           Yes      Yes                                  +---------+---------------+---------+-----------+----------+--------------+ SFJ      Full                                                        +---------+---------------+---------+-----------+----------+--------------+ FV Prox  Full                                                        +---------+---------------+---------+-----------+----------+--------------+ FV Mid   Full                                                        +---------+---------------+---------+-----------+----------+--------------+ FV DistalFull                                                        +---------+---------------+---------+-----------+----------+--------------+ PFV      Full                                                        +---------+---------------+---------+-----------+----------+--------------+ POP      Full           Yes      Yes                                 +---------+---------------+---------+-----------+----------+--------------+ PTV      Full                                                        +---------+---------------+---------+-----------+----------+--------------+  PERO     Full                                                        +---------+---------------+---------+-----------+----------+--------------+   +---------+---------------+---------+-----------+----------+--------------+ LEFT     CompressibilityPhasicitySpontaneityPropertiesThrombus Aging +---------+---------------+---------+-----------+----------+--------------+ CFV      Full           Yes      Yes                                 +---------+---------------+---------+-----------+----------+--------------+ SFJ      Full                                                        +---------+---------------+---------+-----------+----------+--------------+ FV Prox  Full                                                         +---------+---------------+---------+-----------+----------+--------------+ FV Mid   Full                                                        +---------+---------------+---------+-----------+----------+--------------+ FV DistalFull                                                        +---------+---------------+---------+-----------+----------+--------------+ PFV      Full                                                        +---------+---------------+---------+-----------+----------+--------------+ POP      Full           Yes      Yes                                 +---------+---------------+---------+-----------+----------+--------------+ PTV      Full                                                        +---------+---------------+---------+-----------+----------+--------------+ PERO     Full                                                        +---------+---------------+---------+-----------+----------+--------------+  Summary: Right: There is no evidence of deep vein thrombosis in the lower extremity. No cystic structure found in the popliteal fossa. Left: There is no evidence of deep vein thrombosis in the lower extremity. No cystic structure found in the popliteal fossa.  *See table(s) above for measurements and observations.    Preliminary     Review of Systems  Constitutional: Negative for weight loss.  HENT: Negative for ear discharge, ear pain, hearing loss and tinnitus.   Eyes: Negative for blurred vision, double vision, photophobia and pain.  Respiratory: Negative for cough, sputum production and shortness of breath.   Cardiovascular: Negative for chest pain.  Gastrointestinal: Negative for abdominal pain, nausea and vomiting.  Genitourinary: Negative for dysuria, flank pain, frequency and urgency.  Musculoskeletal: Positive for joint pain (Bilateral ankles L>R). Negative for back pain, falls, myalgias and neck pain.  Neurological:  Negative for dizziness, tingling, sensory change, focal weakness, loss of consciousness and headaches.  Endo/Heme/Allergies: Does not bruise/bleed easily.  Psychiatric/Behavioral: Negative for depression, memory loss and substance abuse. The patient is not nervous/anxious.    Blood pressure 132/76, pulse 88, temperature 100 F (37.8 C), resp. rate 16, height 5' 9.5" (1.765 m), weight 90.7 kg, SpO2 95 %. Physical Exam  Constitutional: He appears well-developed and well-nourished. No distress.  HENT:  Head: Normocephalic and atraumatic.  Eyes: Conjunctivae are normal. Right eye exhibits no discharge. Left eye exhibits no discharge. No scleral icterus.  Neck: Normal range of motion.  Cardiovascular: Normal rate and regular rhythm.  Respiratory: Effort normal. No respiratory distress.  Musculoskeletal:     Comments: RLE No traumatic wounds, ecchymosis, or rash  Mild posterior ankle erythema lat and med, no appreciable effusion, mild pain with AROM/PROM  No knee effusion  Knee stable to varus/ valgus and anterior/posterior stress  Sens DPN, SPN, TN intact  Motor EHL, ext, flex, evers 5/5  DP 2+, PT 2+, No significant edema  LLE No traumatic wounds, ecchymosis, or rash  Mild posterior ankle erythema lat and med, mod effusion, mod pain with AROM/PROM but able to accomplish nearly full ROM  No knee effusion  Knee stable to varus/ valgus and anterior/posterior stress  Sens DPN, SPN, TN intact  Motor EHL, ext, flex, evers 5/5  DP 2+, PT 2+, No significant edema  Neurological: He is alert.  Skin: Skin is warm and dry. He is not diaphoretic.  Psychiatric: He has a normal mood and affect. His behavior is normal.    Assessment/Plan: Left ankle pain -- Will aspirate and send for GS, C&S, and cell count. Do not suspect septic arthritis very likely. Multiple medical problems including metastatic malignant melanoma on immunotherapy, history of prostate cancer, diabetes mellitus type 2,  hypothyroidism, history of complete heart block status post pacemaker placement, CAD status post CABG, history of ischemic cardiomyopathy status post ICD -- per primary service    Lisette Abu, PA-C Orthopedic Surgery [phone] 01/07/2019, 11:34 AM          Left ankle was prepped with chloraprep solution. An 18 g needle was inserted into the ankle joint just medial to the TA tendon. 6 cc of straw colored hazy fluid was aspirated and sent to the lab for cell count with diff, crystal ID, and culture.  Suspect gout.  Bertram Savin, MD

## 2019-01-07 NOTE — Progress Notes (Signed)
CRITICAL VALUE ALERT  Critical Value:  Body Fluid Culture showing WBC present, predominantly PMN with no organisms seen.  Date & Time Notied:  01/07/2019 1315  Provider Notified: Alfredia Ferguson MD paged via Shea Evans; Message sent to Ortho consults as well  Orders Received/Actions taken: Awaiting orders and will carry out all orders placed

## 2019-01-07 NOTE — Progress Notes (Signed)
   01/07/19 1004  MEWS Score  Temp (!) 102 F (38.9 C)  MEWS Score  MEWS RR 0  MEWS Pulse 0  MEWS Systolic 0  MEWS LOC 0  MEWS Temp 2  MEWS Score 2  MEWS Score Color Yellow  MEWS Assessment  Is this an acute change? No   Pt admitted for fevers of unknown origin.

## 2019-01-07 NOTE — Progress Notes (Signed)
Bilateral lower extremity venous duplex completed. Refer to "CV Proc" under chart review to view preliminary results.  01/07/2019 10:19 AM Maudry Mayhew, MHA, RVT, RDCS, RDMS

## 2019-01-07 NOTE — Progress Notes (Signed)
Oncology brief note   I saw pt briefly, chart reviewed, neutropenia again, will hold on Granix for today, and may give it after bone marrow biopsy tomorrow if needed.   Bone marrow biopsy tomorrow morning around 7:30-8am, no need NPO. Our NP Mendel Ryder will come to do it at bed side. Consent in his chart.    David [name]  01/07/2019

## 2019-01-08 ENCOUNTER — Other Ambulatory Visit: Payer: Self-pay

## 2019-01-08 ENCOUNTER — Inpatient Hospital Stay (HOSPITAL_COMMUNITY): Payer: Medicare Other

## 2019-01-08 DIAGNOSIS — D469 Myelodysplastic syndrome, unspecified: Secondary | ICD-10-CM

## 2019-01-08 DIAGNOSIS — C61 Malignant neoplasm of prostate: Secondary | ICD-10-CM

## 2019-01-08 DIAGNOSIS — C9241 Acute promyelocytic leukemia, in remission: Secondary | ICD-10-CM

## 2019-01-08 DIAGNOSIS — M10079 Idiopathic gout, unspecified ankle and foot: Secondary | ICD-10-CM

## 2019-01-08 LAB — CULTURE, BLOOD (ROUTINE X 2)
Culture: NO GROWTH
Culture: NO GROWTH
Special Requests: ADEQUATE
Special Requests: ADEQUATE

## 2019-01-08 LAB — CBC WITH DIFFERENTIAL/PLATELET
Abs Immature Granulocytes: 0 10*3/uL (ref 0.00–0.07)
Basophils Absolute: 0 10*3/uL (ref 0.0–0.1)
Basophils Relative: 0 %
Eosinophils Absolute: 0 10*3/uL (ref 0.0–0.5)
Eosinophils Relative: 0 %
HCT: 25.8 % — ABNORMAL LOW (ref 39.0–52.0)
Hemoglobin: 8.4 g/dL — ABNORMAL LOW (ref 13.0–17.0)
Lymphocytes Relative: 22 %
Lymphs Abs: 0.2 10*3/uL — ABNORMAL LOW (ref 0.7–4.0)
MCH: 32.4 pg (ref 26.0–34.0)
MCHC: 32.6 g/dL (ref 30.0–36.0)
MCV: 99.6 fL (ref 80.0–100.0)
Monocytes Absolute: 0.3 10*3/uL (ref 0.1–1.0)
Monocytes Relative: 30 %
Neutro Abs: 0.4 10*3/uL — ABNORMAL LOW (ref 1.7–7.7)
Neutrophils Relative %: 48 %
Platelets: 101 10*3/uL — ABNORMAL LOW (ref 150–400)
RBC: 2.59 MIL/uL — ABNORMAL LOW (ref 4.22–5.81)
RDW: 14.7 % (ref 11.5–15.5)
WBC: 0.9 10*3/uL — CL (ref 4.0–10.5)
nRBC: 0 % (ref 0.0–0.2)

## 2019-01-08 LAB — GLUCOSE, CAPILLARY
Glucose-Capillary: 129 mg/dL — ABNORMAL HIGH (ref 70–99)
Glucose-Capillary: 133 mg/dL — ABNORMAL HIGH (ref 70–99)
Glucose-Capillary: 195 mg/dL — ABNORMAL HIGH (ref 70–99)
Glucose-Capillary: 295 mg/dL — ABNORMAL HIGH (ref 70–99)

## 2019-01-08 LAB — COMPREHENSIVE METABOLIC PANEL
ALT: 16 U/L (ref 0–44)
AST: 15 U/L (ref 15–41)
Albumin: 2.2 g/dL — ABNORMAL LOW (ref 3.5–5.0)
Alkaline Phosphatase: 33 U/L — ABNORMAL LOW (ref 38–126)
Anion gap: 11 (ref 5–15)
BUN: 34 mg/dL — ABNORMAL HIGH (ref 8–23)
CO2: 25 mmol/L (ref 22–32)
Calcium: 7.3 mg/dL — ABNORMAL LOW (ref 8.9–10.3)
Chloride: 95 mmol/L — ABNORMAL LOW (ref 98–111)
Creatinine, Ser: 1.41 mg/dL — ABNORMAL HIGH (ref 0.61–1.24)
GFR calc Af Amer: 55 mL/min — ABNORMAL LOW (ref >60)
GFR calc non Af Amer: 47 mL/min — ABNORMAL LOW (ref >60)
Glucose, Bld: 150 mg/dL — ABNORMAL HIGH (ref 70–99)
Potassium: 4 mmol/L (ref 3.5–5.1)
Sodium: 131 mmol/L — ABNORMAL LOW (ref 135–145)
Total Bilirubin: 0.8 mg/dL (ref 0.3–1.2)
Total Protein: 5.2 g/dL — ABNORMAL LOW (ref 6.5–8.1)

## 2019-01-08 LAB — MAGNESIUM: Magnesium: 2.1 mg/dL (ref 1.7–2.4)

## 2019-01-08 LAB — PHOSPHORUS: Phosphorus: 3.4 mg/dL (ref 2.5–4.6)

## 2019-01-08 MED ORDER — TBO-FILGRASTIM 480 MCG/0.8ML ~~LOC~~ SOSY
480.0000 ug | PREFILLED_SYRINGE | Freq: Once | SUBCUTANEOUS | Status: AC
  Administered 2019-01-08: 480 ug via SUBCUTANEOUS
  Filled 2019-01-08: qty 0.8

## 2019-01-08 NOTE — Progress Notes (Signed)
Patient ID: David Stall., male   DOB: 10-19-1940, 78 y.o.   MRN: [id number]         [facility] Infect[facility] Disease  Date of Admission:  01/02/2019             ASSESSMENT: He underwent arthrocentesis of his left ankle yesterday which revealed 29,400 white blood cells of which 92% were segmented neutrophils.  Monosodium urate crystals were seen confirming an acute flare of gout.  No organisms were seen on Gram stain and cultures are negative at 24 hours.  He underwent bone marrow biopsy this morning.  Strongly suspect that his recent fevers are due to gout rather than infection.  We will continue observation off of antibiotics for now pending culture and bone marrow report.  PLAN: 1. Observe off of antibiotics pending final cultures and bone marrow report  Principal Problem:   Recurrent fever Active Problems:   Pancytopenia ([place])   Gout   Malignant melanoma ([place])   Metastasis to brain [facility])   Metastatic melanoma to lung (HCC)   Prostate cancer ([place])   ICH (intracerebral hemorrhage) (HCC) - L frontal due to HTN vs CAA   OSA on CPAP   Biventricular ICD (implantable cardioverter-defibrillator) in place   Chronic kidney disease, stage III (moderate) (HCC)   Acquired hypothyroidism   Ischemic cardiomyopathy   DJD (degenerative joint disease)   Hx of CABG   Heart block AV complete ([place])   Controlled type 2 diabetes mellitus with hyperglycemia, without long-term current use of insulin (HCC)   Scheduled Meds: . sodium chloride   Intravenous Once  . aspirin EC  81 mg Oral QPM  . bupivacaine  10 mL Infiltration Once  . citalopram  10 mg Oral Daily  . colchicine  0.6 mg Oral BID  . enoxaparin (LOVENOX) injection  40 mg Subcutaneous Q24H  . gabapentin  100 mg Oral BID  . guaiFENesin  1,200 mg Oral BID  . insulin aspart  0-9 Units Subcutaneous TID WC  . levothyroxine  137 mcg Oral Q0600  . methylphenidate  5 mg Oral BID WC  . methylPREDNISolone acetate  80 mg  Intra-articular Once  . polyethylene glycol  17 g Oral BID  . predniSONE  60 mg Oral Q breakfast  . senna-docusate  1 tablet Oral BID   Continuous Infusions: . sodium bicarbonate 150 mEq in dextrose 5% 1000 mL 50 mL/hr at 01/08/19 0530   PRN Meds:.acetaminophen **OR** acetaminophen, levalbuterol, ondansetron **OR** ondansetron (ZOFRAN) IV, oxyCODONE, traZODone   SUBJECTIVE: He is feeling better today.  His joint pain is improving and he was able to walk in the hall with the physical therapist.  Review of Systems: Review of Systems  Constitutional: Positive for fever.  Respiratory: Negative for cough.   Cardiovascular: Negative for chest pain.  Gastrointestinal: Negative for abdominal pain, diarrhea, nausea and vomiting.  Genitourinary: Negative for dysuria.  Musculoskeletal: Positive for joint pain.    Allergies  Allergen Reactions  . Ezetimibe Other (See Comments)    Unknown reaction  . Simvastatin Other (See Comments)    Reaction:  Gave pt a fever  Fever - temp of 103.   In 2003    OBJECTIVE: Vitals:   01/07/19 1946 01/08/19 0547 01/08/19 0923 01/08/19 1336  BP: 109/71 119/85 113/79 107/68  Pulse: 81 82 83 77  Resp: 16 16 18 20   Temp: 98.7 F (37.1 C) (!) 97.5 F (36.4 C) 97.6 F (36.4 C) 98.5 F (36.9 C)  TempSrc:  Oral Oral    SpO2: 98% 99% 97% 94%  Weight:      Height:       Body mass index is 29.11 kg/m.  Physical Exam Constitutional:      Comments: He is alert, smiling and in better spirits.  Cardiovascular:     Rate and Rhythm: Normal rate and regular rhythm.     Heart sounds: No murmur.  Pulmonary:     Effort: Pulmonary effort is normal.     Breath sounds: Normal breath sounds.  Chest:    Abdominal:     Palpations: Abdomen is soft.     Tenderness: There is no abdominal tenderness.  Musculoskeletal:        General: Swelling, tenderness and deformity present.     Comments: The pain, swelling and redness of his fingers and ankles has improved.   He still has some splotchy red rash on his hands.  Psychiatric:        Mood and Affect: Mood normal.     Lab Results Lab Results  Component Value Date   WBC 0.9 (LL) 01/08/2019   HGB 8.4 (L) 01/08/2019   HCT 25.8 (L) 01/08/2019   MCV 99.6 01/08/2019   PLT 101 (L) 01/08/2019    Lab Results  Component Value Date   CREATININE 1.41 (H) 01/08/2019   BUN 34 (H) 01/08/2019   NA 131 (L) 01/08/2019   K 4.0 01/08/2019   CL 95 (L) 01/08/2019   CO2 25 01/08/2019    Lab Results  Component Value Date   ALT 16 01/08/2019   AST 15 01/08/2019   ALKPHOS 33 (L) 01/08/2019   BILITOT 0.8 01/08/2019     Microbiology: Recent Results (from the past 240 hour(s))  Blood Culture (routine x 2)     Status: None   Collection Time: 01/02/19 10:49 PM   Specimen: BLOOD  Result Value Ref Range Status   Specimen Description   Final    BLOOD RIGHT ANTECUBITAL Performed at [facility], [place] [address], [place], [place] [address]    Special Requests   Final    BOTTLES DRAWN AEROBIC AND ANAEROBIC Blood Culture adequate volume Performed at [place] [address] [place], [place] [address]    Culture   Final    NO GROWTH 5 DAYS Performed at [facility], [place] [address] [place], [place] [address]    Report Status 01/08/2019 FINAL  Final  Blood Culture (routine x 2)     Status: None   Collection Time: 01/02/19 10:49 PM   Specimen: BLOOD  Result Value Ref Range Status   Specimen Description   Final    BLOOD LEFT ANTECUBITAL Performed at [place] [address], [place], [place] [address]    Special Requests   Final    BOTTLES DRAWN AEROBIC AND ANAEROBIC Blood Culture adequate volume Performed at [place] [address], [place], [place] [address]    Culture   Final    NO GROWTH 5 DAYS Performed at [facility], [place] [address], [place], [place] [address]    Report Status 01/08/2019 FINAL   Final  Urine culture     Status: None   Collection Time: 01/03/19 12:46 AM   Specimen: In/Out Cath Urine  Result Value Ref Range Status   Specimen Description   Final    IN/OUT CATH URINE Performed at [place] [place], [place]  [address]    Special Requests   Final    NONE Performed at [facility], [place] [address], [place], [place] [address]    Culture   Final    NO GROWTH Performed at [facility], [place] [address], [place], [place] [address]    Report Status 01/04/2019 FINAL  Final  SARS Coronavirus [facility] order, Performed in [facility] hospital lab) Nasopharyngeal Nasopharyngeal Swab     Status: None   Collection Time: 01/03/19 12:46 AM   Specimen: Nasopharyngeal Swab  Result Value Ref Range Status   SARS Coronavirus 2 NEGATIVE NEGATIVE Final    Comment: (NOTE) If result is NEGATIVE SARS-CoV-2 target nucleic acids are NOT DETECTED. The SARS-CoV-2 RNA is generally detectable in upper and lower  respiratory specimens during the acute phase of infection. The lowest  concentration of SARS-CoV-2 viral copies this assay can detect is 250  copies / mL. A negative result does not preclude SARS-CoV-2 infection  and should not be used as the sole basis for treatment or other  patient management decisions.  A negative result may occur with  improper specimen collection / handling, submission of specimen other  than nasopharyngeal swab, presence of viral mutation(s) within the  areas targeted by this assay, and inadequate number of viral copies  (<250 copies / mL). A negative result must be combined with clinical  observations, patient history, and epidemiological information. If result is POSITIVE SARS-CoV-2 target nucleic acids are DETECTED. The SARS-CoV-2 RNA is generally detectable in upper and lower  respiratory specimens dur ing the acute phase of infection.  Positive  results are indicative of active infection  with SARS-CoV-2.  Clinical  correlation with patient history and other diagnostic information is  necessary to determine patient infection status.  Positive results do  not rule out bacterial infection or co-infection with other viruses. If result is PRESUMPTIVE POSTIVE SARS-CoV-2 nucleic acids MAY BE PRESENT.   A presumptive positive result was obtained on the submitted specimen  and confirmed on repeat testing.  While 2019 novel coronavirus  (SARS-CoV-2) nucleic acids may be present in the submitted sample  additional confirmatory testing may be necessary for epidemiological  and / or clinical management purposes  to differentiate between  SARS-CoV-2 and other Sarbecovirus currently known to infect humans.  If clinically indicated additional testing with an alternate test  methodology [phone]) is advised. The SARS-CoV-2 RNA is generally  detectable in upper and lower respiratory sp ecimens during the acute  phase of infection. The expected result is Negative. Fact Sheet for Patients:  StrictlyIdeas.no Fact Sheet for Healthcare Providers: BankingDealers.co.za This test is not yet approved or cleared by the [place] FDA and has been authorized for detection and/or diagnosis of SARS-CoV-2 by FDA under an Emergency Use Authorization (EUA).  This EUA will remain in effect (meaning this test can be used) for the duration of the COVID-19 declaration under Section 564(b)(1) of the Act, 21 U.S.C. section 360bbb-3(b)(1), unless the authorization is terminated or revoked sooner. Performed at [facility], [place] [address], [place], [place] [address]   MRSA PCR Screening     Status: None   Collection Time: 01/04/19  4:59 PM   Specimen: Nasal Mucosa; Nasopharyngeal  Result Value Ref Range Status   MRSA by PCR NEGATIVE NEGATIVE Final    Comment:        The GeneXpert MRSA Assay (FDA approved for NASAL specimens only), is  one component of a comprehensive MRSA colonization  surveillance program. It is not intended to diagnose MRSA infection nor to guide or monitor treatment for MRSA infections. Performed at [facility], [place] [address], [place], [place] [address]   Fungus culture, blood     Status: None (Preliminary result)   Collection Time: 01/05/19  2:29 PM   Specimen: BLOOD  Result Value Ref Range Status   Specimen Description   Final    BLOOD BLOOD RIGHT HAND Performed at [place] [address], [place], [place] [address]    Special Requests   Final    BOTTLES DRAWN AEROBIC ONLY Blood Culture adequate volume Performed at [place] [address] [place], [place] [address]    Culture   Final    NO GROWTH 3 DAYS Performed at [facility], [place] [address], [place], [place] [address]    Report Status PENDING  Incomplete  Body fluid culture     Status: None (Preliminary result)   Collection Time: 01/07/19 11:47 AM   Specimen: Synovium; Synovial Fluid  Result Value Ref Range Status   Specimen Description   Final    SYNOVIAL LEFT ANKLE Performed at [place] [address] [place], [place] [address]    Special Requests   Final    Immunocompromised Performed at [facility], [place] [address], [place], [place] [address]    Gram Stain   Final    WBC PRESENT, PREDOMINANTLY PMN NO ORGANISMS SEEN CYTOSPIN Gram Stain Report Called to,Read Back By and Verified With: Jorge Ny RN ON 01/07/19 @ 1310 BY LE Performed at [facility], [place] [address] [place], [place] [address]    Culture   Final    NO GROWTH < 24 HOURS Performed at [place] [address], [place],  [address]    Report Status PENDING  Incomplete    Michel Bickers, MD [facility] for [facility] [facility] [phone] pager   [phone] cell 01/08/2019,  2:29 PM

## 2019-01-08 NOTE — Procedures (Signed)
INDICATION: neutropenia  Bone Marrow Biopsy and Aspiration Procedure Note   Informed consent was obtained and potential risks including bleeding, infection and pain were reviewed with the patient.  The patient's name, date of birth, identification, consent and allergies were verified prior to the start of procedure and time out was performed.  The right posterior iliac crest was chosen as the site of biopsy.  The skin was prepped with ChloraPrep.   14 cc of 2% lidocaine was used to provide local anaesthesia.   10 cc of bone marrow aspirate was obtained followed by 1cm biopsy.  Pressure was applied to the biopsy site and bandage was placed over the biopsy site. Patient was made to lie on the back for 30 mins with pressure.    The procedure was tolerated well. COMPLICATIONS: None BLOOD LOSS: none  Specimens sent for flow cytometry, cytogenetics and additional studies.  I gave report to nurse to get VS in 30 minutes along with checking the bandage,and to call for any concerns.  Reviewed instructions with patient as well, and gave him our number and showed him how to operate his phone.  He tolerated well and was appreciative.    Signed  C , NP   

## 2019-01-08 NOTE — Progress Notes (Signed)
David [name].   DOB:1941-03-23   VQ#:[id number]   CMK#:[id number]  Oncology follow up note   Subjective: Patient is afebrile today, feels better.  He tolerated bone marrow biopsy very well this morning.  He noticed new left arm edema and ecchymosis in the medial left elbow. No other bleeding   Objective:  Vitals:   01/08/19 1336 01/08/19 1917  BP: 107/68 124/82  Pulse: 77 75  Resp: 20 19  Temp: 98.5 F (36.9 C) 98.5 F (36.9 C)  SpO2: 94% 94%    Body mass index is 29.11 kg/m.  Intake/Output Summary (Last 24 hours) at 01/08/2019 2048 Last data filed at 01/08/2019 1829 Gross per 24 hour  Intake 5077.63 ml  Output 2100 ml  Net 2977.63 ml     Sclerae unicteric  Oropharynx clear  No peripheral adenopathy  Lungs clear -- no rales or rhonchi  Heart regular rate and rhythm  Abdomen benign  MSK no focal spinal tenderness,(+) left arm edema and diffuse ecchymosis in the medial left elbow  Neuro nonfocal    CBG (last 3)  Recent Labs    01/08/19 0740 01/08/19 1203 01/08/19 1703  GLUCAP 129* 133* 195*     Labs:  Lab Results  Component Value Date   WBC 0.9 (LL) 01/08/2019   HGB 8.4 (L) 01/08/2019   HCT 25.8 (L) 01/08/2019   MCV 99.6 01/08/2019   PLT 101 (L) 01/08/2019   NEUTROABS 0.4 (L) 01/08/2019    Urine Studies No results for input(s): UHGB, CRYS in the last 72 hours.  Invalid input(s): UACOL, UAPR, USPG, UPH, UTP, UGL, UKET, UBIL, UNIT, UROB, [place], UEPI, UWBC, [place], [place], [place], [place], [place]  Basic Metabolic Panel: Recent Labs  Lab 01/04/19 0539 01/05/19 0521 01/06/19 0542 01/07/19 0518 01/08/19 0626  NA 136 138 134* 133* 131*  K 3.9 4.2 4.2 3.9 4.0  CL 109 106 103 100 95*  CO2 19* 23 18* 24 25  GLUCOSE 101* 87 100* 159* 150*  BUN 52* 31* 26* 27* 34*  CREATININE 1.31* 1.17 1.30* 1.52* 1.41*  CALCIUM 7.4* 7.9* 7.6* 7.6* 7.3*  MG 2.1 1.9 1.7 1.9 2.1  PHOS 3.1 1.8* 2.7 2.3* 3.4   GFR Estimated Creatinine Clearance: 47.7 mL/min (A) (by C-G  formula based on SCr of 1.41 mg/dL (H)). Liver Function Tests: Recent Labs  Lab 01/04/19 0539 01/05/19 0521 01/06/19 0542 01/07/19 0518 01/08/19 0626  AST 15 16 13* 14* 15  ALT _0 ALKPHOS 29* 35* 34* 34* 33*  BILITOT 0.7 0.6 0.8 0.8 0.8  PROT 4.7* 5.3* 5.2* 5.3* 5.2*  ALBUMIN 2.5* 2.6* 2.4* 2.4* 2.2*   No results for input(s): LIPASE, AMYLASE in the last 168 hours. No results for input(s): AMMONIA in the last 168 hours. Coagulation profile Recent Labs  Lab 01/02/19 2219  INR 1.2    CBC: Recent Labs  Lab 01/04/19 0539 01/05/19 0521 01/06/19 0542 01/07/19 0518 01/08/19 0626  WBC 0.7* 2.9* 2.7* 2.2* 0.9*  NEUTROABS 0.4* 2.5 2.0 1.1* 0.4*  HGB 8.2* 9.6* 9.5* 9.5* 8.4*  HCT 24.6* 28.9* 29.1* 28.6* 25.8*  MCV 98.4 98.6 100.3* 99.0 99.6  PLT 121* 132* 139* 125* 101*   Cardiac Enzymes: No results for input(s): CKTOTAL, CKMB, CKMBINDEX, TROPONINI in the last 168 hours. BNP: Invalid input(s): POCBNP CBG: Recent Labs  Lab 01/07/19 1718 01/07/19 1948 01/08/19 0740 01/08/19 1203 01/08/19 1703  GLUCAP 302* 248* 129* 133* 195*   D-Dimer No results for input(s): DDIMER  in the last 72 hours. Hgb A1c No results for input(s): HGBA1C in the last 72 hours. Lipid Profile No results for input(s): CHOL, HDL, LDLCALC, TRIG, CHOLHDL, LDLDIRECT in the last 72 hours. Thyroid function studies No results for input(s): TSH, T4TOTAL, T3FREE, THYROIDAB in the last 72 hours.  Invalid input(s): FREET3 Anemia work up No results for input(s): VITAMINB12, FOLATE, FERRITIN, TIBC, IRON, RETICCTPCT in the last 72 hours. Microbiology Recent Results (from the past 240 hour(s))  Blood Culture (routine x 2)     Status: None   Collection Time: 01/02/19 10:49 PM   Specimen: BLOOD  Result Value Ref Range Status   Specimen Description   Final    BLOOD RIGHT ANTECUBITAL Performed at [facility] [address], [place], [place] [address]    Special Requests    Final    BOTTLES DRAWN AEROBIC AND ANAEROBIC Blood Culture adequate volume Performed at [facility] [address] [place], [place] [address]    Culture   Final    NO GROWTH 5 DAYS Performed at [facility], [place] [address], [place], [place] [address]    Report Status 01/08/2019 FINAL  Final  Blood Culture (routine x 2)     Status: None   Collection Time: 01/02/19 10:49 PM   Specimen: BLOOD  Result Value Ref Range Status   Specimen Description   Final    BLOOD LEFT ANTECUBITAL Performed at [facility] [address], [place], [place] [address]    Special Requests   Final    BOTTLES DRAWN AEROBIC AND ANAEROBIC Blood Culture adequate volume Performed at [facility] [address], [place], [place] [address]    Culture   Final    NO GROWTH 5 DAYS Performed at [facility], [place] [address] [place], [place] [address]    Report Status 01/08/2019 FINAL  Final  Urine culture     Status: None   Collection Time: 01/03/19 12:46 AM   Specimen: In/Out Cath Urine  Result Value Ref Range Status   Specimen Description   Final    IN/OUT CATH URINE Performed at [facility] [address], [place], [place] [address]    Special Requests   Final    NONE Performed at [facility], [place] [address], [place], [place] [address]    Culture   Final    NO GROWTH Performed at [facility], [place] [address] [place], [place] [address]    Report Status 01/04/2019 FINAL  Final  SARS Coronavirus 2 [facility] order, Performed in [facility] hospital lab) Nasopharyngeal Nasopharyngeal Swab     Status: None   Collection Time: 01/03/19 12:46 AM   Specimen: Nasopharyngeal Swab  Result Value Ref Range Status   SARS Coronavirus 2 NEGATIVE NEGATIVE Final    Comment: (NOTE) If result is NEGATIVE SARS-CoV-2 target nucleic acids are NOT DETECTED. The SARS-CoV-2 RNA is generally detectable in  upper and lower  respiratory specimens during the acute phase of infection. The lowest  concentration of SARS-CoV-2 viral copies this assay can detect is 250  copies / mL. A negative result does not preclude SARS-CoV-2 infection  and should not be used as the sole basis for treatment or other  patient management decisions.  A negative result may occur with  improper specimen collection / handling, submission of specimen other  than nasopharyngeal swab, presence of viral mutation(s) within the  areas targeted by  this assay, and inadequate number of viral copies  (<250 copies / mL). A negative result must be combined with clinical  observations, patient history, and epidemiological information. If result is POSITIVE SARS-CoV-2 target nucleic acids are DETECTED. The SARS-CoV-2 RNA is generally detectable in upper and lower  respiratory specimens dur ing the acute phase of infection.  Positive  results are indicative of active infection with SARS-CoV-2.  Clinical  correlation with patient history and other diagnostic information is  necessary to determine patient infection status.  Positive results do  not rule out bacterial infection or co-infection with other viruses. If result is PRESUMPTIVE POSTIVE SARS-CoV-2 nucleic acids MAY BE PRESENT.   A presumptive positive result was obtained on the submitted specimen  and confirmed on repeat testing.  While 2019 novel coronavirus  (SARS-CoV-2) nucleic acids may be present in the submitted sample  additional confirmatory testing may be necessary for epidemiological  and / or clinical management purposes  to differentiate between  SARS-CoV-2 and other Sarbecovirus currently known to infect humans.  If clinically indicated additional testing with an alternate test  methodology [phone]) is advised. The SARS-CoV-2 RNA is generally  detectable in upper and lower respiratory sp ecimens during the acute  phase of infection. The expected result is  Negative. Fact Sheet for Patients:  StrictlyIdeas.no Fact Sheet for Healthcare Providers: BankingDealers.co.za This test is not yet approved or cleared by the [place] FDA and has been authorized for detection and/or diagnosis of SARS-CoV-2 by FDA under an Emergency Use Authorization (EUA).  This EUA will remain in effect (meaning this test can be used) for the duration of the COVID-19 declaration under Section 564(b)(1) of the Act, 21 U.S.C. section 360bbb-3(b)(1), unless the authorization is terminated or revoked sooner. Performed at [facility], [place] [address] [place], [place] [address]   MRSA PCR Screening     Status: None   Collection Time: 01/04/19  4:59 PM   Specimen: Nasal Mucosa; Nasopharyngeal  Result Value Ref Range Status   MRSA by PCR NEGATIVE NEGATIVE Final    Comment:        The GeneXpert MRSA Assay (FDA approved for NASAL specimens only), is one component of a comprehensive MRSA colonization surveillance program. It is not intended to diagnose MRSA infection nor to guide or monitor treatment for MRSA infections. Performed at [facility], [place] [address] [place], [place] [address]   Fungus culture, blood     Status: None (Preliminary result)   Collection Time: 01/05/19  2:29 PM   Specimen: BLOOD  Result Value Ref Range Status   Specimen Description   Final    BLOOD BLOOD RIGHT HAND Performed at [facility] [address], [place], [place] [address]    Special Requests   Final    BOTTLES DRAWN AEROBIC ONLY Blood Culture adequate volume Performed at [facility] [address], [place], [place] [address]    Culture   Final    NO GROWTH 3 DAYS Performed at [facility], [place] [address], [place], [place] [address]    Report Status PENDING  Incomplete  Body fluid culture     Status: None (Preliminary result)   Collection  Time: 01/07/19 11:47 AM   Specimen: Synovium; Synovial Fluid  Result Value Ref Range Status   Specimen Description   Final    SYNOVIAL LEFT ANKLE Performed at [facility] [address] [place], [place] [address]  Special Requests   Final    Immunocompromised Performed at [facility], [place] [address], [place], [place] [address]    Gram Stain   Final    WBC PRESENT, PREDOMINANTLY PMN NO ORGANISMS SEEN CYTOSPIN Gram Stain Report Called to,Read Back By and Verified With: Jorge Ny RN ON 22-Jan-2019 @ 1310 BY LE Performed at [facility], [place] [address] [place], [place] [address]    Culture   Final    NO GROWTH < 24 HOURS Performed at [facility] [address], [place], [place] [address]    Report Status PENDING  Incomplete      Studies:  US Renal  Result Date: 22-Jan-2019 CLINICAL DATA:  Acute kidney injury.  Urinary retention. EXAM: RENAL / URINARY TRACT ULTRASOUND COMPLETE COMPARISON:  CT chest, abdomen and pelvis 01/05/2019. FINDINGS: Right Kidney: Renal measurements: 10.1 x 5.4 x 4.7 cm = volume: 135.1 mL . Echogenicity within normal limits. No solid mass or hydronephrosis visualized. 2.8 cm simple cyst noted. Left Kidney: Renal measurements: 12.4 x 5.5 x 5.0 cm = volume: 178.4 mL. Echogenicity within normal limits. No solid mass or hydronephrosis visualized. Simple 4.1 cm cyst noted. Bladder: Appears normal for degree of bladder distention. IMPRESSION: Negative for hydronephrosis.  No acute abnormality. Single simple cysts are seen in each kidney. Electronically Signed   By: Inge Rise M.D.   On: 01-22-2019 14:26   Dg Chest Port 1 View  Result Date: 01/08/2019 CLINICAL DATA:  Shortness of breath EXAM: PORTABLE CHEST 1 VIEW COMPARISON:  01/05/2019 FINDINGS: No significant change in AP portable examination. The lungs are normally aerated. Cardiomegaly status post median sternotomy CABG with left chest multi  lead pacer. IMPRESSION: No acute abnormality of the lungs in AP portable projection. Electronically Signed   By: Eddie Candle M.D.   On: 01/08/2019 08:19   Vas [place] Lower Extremity Venous (dvt)  Result Date: 01-22-19  Lower Venous Study Indications: Swelling.  Limitations: Poor ultrasound/tissue interface. Comparison Study: No prior study. Performing Technologist: Maudry Mayhew MHA, RDMS, RVT, RDCS  Examination Guidelines: A complete evaluation includes B-mode imaging, spectral Doppler, color Doppler, and power Doppler as needed of all accessible portions of each vessel. Bilateral testing is considered an integral part of a complete examination. Limited examinations for reoccurring indications may be performed as noted.  +---------+---------------+---------+-----------+----------+--------------+ RIGHT    CompressibilityPhasicitySpontaneityPropertiesThrombus Aging +---------+---------------+---------+-----------+----------+--------------+ CFV      Full           Yes      Yes                                 +---------+---------------+---------+-----------+----------+--------------+ SFJ      Full                                                        +---------+---------------+---------+-----------+----------+--------------+ FV Prox  Full                                                        +---------+---------------+---------+-----------+----------+--------------+ FV Mid   Full                                                        +---------+---------------+---------+-----------+----------+--------------+  FV DistalFull                                                        +---------+---------------+---------+-----------+----------+--------------+ PFV      Full                                                        +---------+---------------+---------+-----------+----------+--------------+ POP      Full           Yes      Yes                                  +---------+---------------+---------+-----------+----------+--------------+ PTV      Full                                                        +---------+---------------+---------+-----------+----------+--------------+ PERO     Full                                                        +---------+---------------+---------+-----------+----------+--------------+   +---------+---------------+---------+-----------+----------+--------------+ LEFT     CompressibilityPhasicitySpontaneityPropertiesThrombus Aging +---------+---------------+---------+-----------+----------+--------------+ CFV      Full           Yes      Yes                                 +---------+---------------+---------+-----------+----------+--------------+ SFJ      Full                                                        +---------+---------------+---------+-----------+----------+--------------+ FV Prox  Full                                                        +---------+---------------+---------+-----------+----------+--------------+ FV Mid   Full                                                        +---------+---------------+---------+-----------+----------+--------------+ FV DistalFull                                                        +---------+---------------+---------+-----------+----------+--------------+  PFV      Full                                                        +---------+---------------+---------+-----------+----------+--------------+ POP      Full           Yes      Yes                                 +---------+---------------+---------+-----------+----------+--------------+ PTV      Full                                                        +---------+---------------+---------+-----------+----------+--------------+ PERO     Full                                                         +---------+---------------+---------+-----------+----------+--------------+     Summary: Right: There is no evidence of deep vein thrombosis in the lower extremity. No cystic structure found in the popliteal fossa. Left: There is no evidence of deep vein thrombosis in the lower extremity. No cystic structure found in the popliteal fossa.  *See table(s) above for measurements and observations. Electronically signed by Deitra Mayo MD on 01/07/2019 at 4:31:21 PM.    Final     Assessment: 78 y.o. 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  He recently developed pancytopenia, he was hospitalized for neutropenic fever and cellulitis in early September.  With neutropenic fever again.  1. Fever likely secondary to acute left ankle gout attack, fever resolved now 2. Probable pneumonia  3. Pancytopenia, MDS  4.  Newly diagnosed prostate cancer, has not been treated 5.  History of metastatic melanoma to brian, NED now  6.  History of hemorrhagic stroke, from brain metastasis, cognitive dysfunction   Plan:  -his bone marrow aspirates has been reviewed by our pathologist Dr. Tresa Moore today, the preliminary diagnosis is MDS, no excessive blasts.  His bone marrow core biopsy will be reviewed tomorrow, and the cytology may take up to 2 weeks. -I discussed the results with patient and his [name].  MDS risk stratification is based on cytogenetics and FISH results.  -I recommend GCSF for his neutropenia. Will give one dose granix today. And start him on neulasta in office when I get his insurance approval  -left arm doppler to rule out DVT, discussed with Dr. Cyndia Skeeters  -OK to discharge from my standpoint, and I will set up his outpt f/u.   [name], MD 01/08/2019

## 2019-01-08 NOTE — Progress Notes (Signed)
Physical Therapy Treatment Patient Details Name: David [name]. MRN: [id number] DOB: November 04, 1940 Today's Date: 01/08/2019    History of Present Illness Tejuan, Gholson. is a 78 year old Caucasian male with a past medical history significant for but not limited to history of metastatic malignant melanoma with mets to lungs and brain on immunotherapy, history of prostate cancer, diabetes mellitus type 2, hypothyroidism, history of complete heart block status post pacemaker placement, CAD status post CABG, history of ischemic cardiomyopathy status post ICD and other comorbidities who presented with fevers and chills at home with a temperature of 101.  Family was concerned and brought him to the ER he was found to be neutropenic.    PT Comments    Assisted OOB to amb required + 2 assist for safety.  General bed mobility comments: increased time and use of rail.  General Gait Details: due to Gout Pain B feet and pt was unable to amb on eval, used EVA walker and + 2 assist for safety.  Mod c/o weakness and 7/10 B foot pain with activity.  recliner following for safety. Otherwise tolerated activity well. Pt was just released from [facility] on 12/27/18 Pt unsure of D/C plans this admission SNF vs [facility] 24/7 all he wants is to get back home back to his spouse who has Alzheimer    Follow Up Recommendations  CIR;SNF(pt was just D/C from CIR 12/27/18)  [place] but will need 24/7 initial assist   Equipment Recommendations  None recommended by PT    Recommendations for Other Services       Precautions / Restrictions Precautions Precautions: Fall Restrictions Weight Bearing Restrictions: No Other Position/Activity Restrictions: B foot pain fromposs  gout    Mobility  Bed Mobility Overal bed mobility: Needs Assistance Bed Mobility: Supine to Sit     Supine to sit: Min assist     General bed mobility comments: increased time and use of rail  Transfers Overall transfer level: Needs  assistance Equipment used: None Transfers: Sit to/from Stand;Stand Pivot Transfers Sit to Stand: +2 safety/equipment;Min assist Stand pivot transfers: Min assist;Mod assist;+2 safety/equipment       General transfer comment: increased time and + 2 assist for safety  Ambulation/Gait Ambulation/Gait assistance: Min assist;Mod assist;+2 physical assistance Gait Distance (Feet): 115 Feet Assistive device: Bilateral platform walker(EVA walker) Gait Pattern/deviations: Trunk flexed;Decreased step length - right;Decreased stance time - right Gait velocity: decreased   General Gait Details: due to Gout Pain B feet and pt was unable to amb on eval, used EVA walker and + 2 assist for safety.  Mod c/o weakness and 7/10 B foot pain with activity.  recliner following for safety.   Stairs             Wheelchair Mobility    Modified Rankin (Stroke Patients Only)       Balance                                            Cognition Arousal/Alertness: Awake/alert Behavior During Therapy: WFL for tasks assessed/performed Overall Cognitive Status: Within Functional Limits for tasks assessed                                 General Comments: pleasant      Exercises      General Comments  Pertinent Vitals/Pain      Home Living                      Prior Function            PT Goals (current goals can now be found in the care plan section) Progress towards PT goals: Progressing toward goals    Frequency    Min 2X/week      PT Plan Discharge plan needs to be updated;Frequency needs to be updated    Co-evaluation              AM-PAC PT "6 Clicks" Mobility   Outcome Measure  Help needed turning from your back to your side while in a flat bed without using bedrails?: A Lot Help needed moving from lying on your back to sitting on the side of a flat bed without using bedrails?: A Lot Help needed moving to and  from a bed to a chair (including a wheelchair)?: A Lot Help needed standing up from a chair using your arms (e.g., wheelchair or bedside chair)?: A Lot Help needed to walk in hospital room?: A Lot Help needed climbing 3-5 steps with a railing? : A Lot 6 Click Score: 12    End of Session Equipment Utilized During Treatment: Gait belt Activity Tolerance: Patient limited by fatigue Patient left: with call bell/phone within reach;in chair;with chair alarm set Nurse Communication: Mobility status PT Visit Diagnosis: Unsteadiness on feet (R26.81);Muscle weakness (generalized) (M62.81);Pain Pain - Right/Left: Right Pain - part of body: Ankle and joints of foot     Time: 1660-6004 PT Time Calculation (min) (ACUTE ONLY): 32 min  Charges:  $Gait Training: 8-22 mins $Therapeutic Activity: 8-22 mins                     Rica Koyanagi  PTA Acute  Rehabilitation Services Pager      [phone] Office      [phone]

## 2019-01-08 NOTE — Progress Notes (Signed)
PROGRESS NOTE  David [name]. LHT:[id number] DOB: [birth year]   PCP: [name], MD  Patient is from: Home  DOA: 01/02/2019 LOS: 5  Brief Narrative / Interim history: 78 year old male with history of metastatic malignant melanoma on immunotherapy, prostate cancer, DM-2, hypothyroidism, heart block/PPM, CAD/CABG, ICM/ICD presenting with fever and chills and admitted with neutropenic fever likely due to pneumonia.  Started on broad-spectrum antibiotics.  Medical oncology, ID and palliative care consulted.  After evaluation by ID, fever thought to be due to gout.  Arthrocentesis by Ortho.  Fluid analysis was consistent with gout.  Antibiotics discontinued and he was started on prednisone.   Patient had bone marrow biopsy on 9/30.  Pulmonary consult on this MDS.   Subjective: No major events overnight of this morning.  Continues to endorse bilateral ankle pain right greater than left.  Denies chest pain, dyspnea, GI or GU symptoms.  Objective: Vitals:   01/07/19 1946 01/08/19 0547 01/08/19 0923 01/08/19 1336  BP: 109/71 119/85 113/79 107/68  Pulse: 81 82 83 77  Resp: 16 16 18 20   Temp: 98.7 F (37.1 C) (!) 97.5 F (36.4 C) 97.6 F (36.4 C) 98.5 F (36.9 C)  TempSrc: Oral Oral    SpO2: 98% 99% 97% 94%  Weight:      Height:        Intake/Output Summary (Last 24 hours) at 01/08/2019 1505 Last data filed at 01/08/2019 1215 Gross per 24 hour  Intake 1463.49 ml  Output 1850 ml  Net -386.51 ml   Filed Weights   01/03/19 0437  Weight: 90.7 kg    Examination:  GENERAL: No acute distress.  Appears well.  HEENT: MMM.  Vision and hearing grossly intact.  NECK: Supple.  No apparent JVD.  RESP:  No IWOB. Good air movement bilaterally. CVS:  RRR. Heart sounds normal.  ABD/GI/GU: Bowel sounds present. Soft. Non tender.  MSK/EXT: Bilateral ankle swelling, left greater than right.  Tender to palpation.  Left upper extremity swelling as well. SKIN: no apparent skin lesion  or wound NEURO: Awake, alert and oriented appropriately.  No gross deficit.  PSYCH: Calm. Normal affect.   Assessment & Plan: Febrile neutropenia-neutropenia worse today. -Initially concern about pneumonia based on chest x-ray which has been ruled out. -Extensive work-up including CT abdomen and pelvis, and cultures without significant finding.  -Has bilateral ankle swelling and pain.  Fluid study consistent with gout.  Culture and Gram stain negative. -Observing off antibiotic per ID. -Continue prednisone for gout. -Granix per oncology  Acute gout flareup: patient with bilateral ankle swelling and pain. -Arthrocentesis consistent with gout.  Gram stain and cultures negative. -Left ankle x-ray with some bony irregularities at the tip of the medial malleolus. -Appreciate orthopedic surgery input -Status post intra-articular Depo-Medrol injection -On colchicine and high-dose prednisone.  Pancytopenia/MDS:  -Bone marrow biopsy on 9/30 reveals MDS. -Oncology restarted Granix.  Metastatic malignant melanoma/history of prostate cancer -Oncology on board.  Left upper extremity swelling -Check venous Doppler to exclude DVT.  Essential hypertension: Normotensive. -Continue current regimen  History of CAD/CABG/ICM s/p ICD: Stable.  No cardiopulmonary symptoms. -Continue home aspirin  DM-2 with CKD-3: CBG within fair range. -Continue current regimen  CKD-3/BMD/hyponatremia: Stable. -Avoid nephrotoxic meds  History of complete heart block s/p PPM: Stable  Hypothyroidism -Continue home Synthroid  LUTS: Reports hesitancy.  Has history of prostate cancer.  Has excellent urine output.  AAA: CT scan redemonstrated 3.1 cm infrarenal AAA. -Recommended outpatient follow-up  Goal of care: Patient with  multiple comorbidities.  Per previous attending, patient's daughter requested palliative care consult that has been placed.  DVT prophylaxis: Subcu Lovenox Code Status: Full  code Family Communication: Updated patient's daughter Disposition Plan: Remains inpatient. Consultants: Oncology, infectious disease, orthopedic surgery, palliative care  Procedures:  Arthrocentesis Bone marrow biopsy  Microbiology summarized: COVID-19 negative. Blood cultures negative. Synovial fluid stain and culture negative. MRSA PCR negative.  Antimicrobials: Anti-infectives (From admission, onward)   Start     Dose/Rate Route Frequency Ordered Stop   01/07/19 1000  meropenem (MERREM) 1 g in sodium chloride 0.9 % 100 mL IVPB  Status:  Discontinued     1 g 200 mL/hr over 30 Minutes Intravenous Every 12 hours 01/07/19 0909 01/07/19 0937   01/06/19 0800  vancomycin (VANCOCIN) 1,500 mg in sodium chloride 0.9 % 500 mL IVPB  Status:  Discontinued     1,500 mg 250 mL/hr over 120 Minutes Intravenous Every 24 hours 01/05/19 1618 01/05/19 1710   01/05/19 2000  doxycycline (VIBRAMYCIN) 100 mg in sodium chloride 0.9 % 250 mL IVPB  Status:  Discontinued     100 mg 125 mL/hr over 120 Minutes Intravenous Every 12 hours 01/05/19 1825 01/07/19 0937   01/05/19 1800  meropenem (MERREM) 1 g in sodium chloride 0.9 % 100 mL IVPB  Status:  Discontinued     1 g 200 mL/hr over 30 Minutes Intravenous Every 8 hours 01/05/19 0907 01/07/19 0909   01/05/19 0930  meropenem (MERREM) 1 g in sodium chloride 0.9 % 100 mL IVPB     1 g 200 mL/hr over 30 Minutes Intravenous STAT 01/05/19 0906 01/05/19 0951   01/04/19 1200  vancomycin (VANCOCIN) 1,250 mg in sodium chloride 0.9 % 250 mL IVPB  Status:  Discontinued     1,250 mg 166.7 mL/hr over 90 Minutes Intravenous Every 24 hours 01/03/19 0657 01/05/19 1618   01/03/19 0700  vancomycin (VANCOCIN) 2,000 mg in sodium chloride 0.9 % 500 mL IVPB     2,000 mg 250 mL/hr over 120 Minutes Intravenous  Once 01/03/19 0657 01/03/19 1350   01/03/19 0600  azithromycin (ZITHROMAX) 500 mg in sodium chloride 0.9 % 250 mL IVPB  Status:  Discontinued     500 mg 250 mL/hr over  60 Minutes Intravenous Every 24 hours 01/03/19 0521 01/05/19 1825   01/03/19 0600  ceFEPIme (MAXIPIME) 2 g in sodium chloride 0.9 % 100 mL IVPB  Status:  Discontinued     2 g 200 mL/hr over 30 Minutes Intravenous Every 12 hours 01/03/19 0529 01/05/19 0808   01/02/19 2230  piperacillin-tazobactam (ZOSYN) IVPB 3.375 g     3.375 g 100 mL/hr over 30 Minutes Intravenous  Once 01/02/19 2220 01/03/19 0136      Sch Meds:  Scheduled Meds:  sodium chloride   Intravenous Once   aspirin EC  81 mg Oral QPM   bupivacaine  10 mL Infiltration Once   citalopram  10 mg Oral Daily   colchicine  0.6 mg Oral BID   enoxaparin (LOVENOX) injection  40 mg Subcutaneous Q24H   gabapentin  100 mg Oral BID   guaiFENesin  1,200 mg Oral BID   insulin aspart  0-9 Units Subcutaneous TID WC   levothyroxine  137 mcg Oral Q0600   methylphenidate  5 mg Oral BID WC   methylPREDNISolone acetate  80 mg Intra-articular Once   polyethylene glycol  17 g Oral BID   predniSONE  60 mg Oral Q breakfast   senna-docusate  1 tablet Oral  BID   Tbo-filgastrim (GRANIX) SQ  480 mcg Subcutaneous ONCE-1800   Continuous Infusions:  sodium bicarbonate 150 mEq in dextrose 5% 1000 mL 50 mL/hr at 01/08/19 0530   PRN Meds:.acetaminophen **OR** acetaminophen, levalbuterol, ondansetron **OR** ondansetron (ZOFRAN) IV, oxyCODONE, traZODone   I have personally reviewed the following labs and images: CBC: Recent Labs  Lab 01/04/19 0539 01/05/19 0521 01/06/19 0542 01/07/19 0518 01/08/19 0626  WBC 0.7* 2.9* 2.7* 2.2* 0.9*  NEUTROABS 0.4* 2.5 2.0 1.1* 0.4*  HGB 8.2* 9.6* 9.5* 9.5* 8.4*  HCT 24.6* 28.9* 29.1* 28.6* 25.8*  MCV 98.4 98.6 100.3* 99.0 99.6  PLT 121* 132* 139* 125* 101*   BMP &GFR Recent Labs  Lab 01/04/19 0539 01/05/19 0521 01/06/19 0542 01/07/19 0518 01/08/19 0626  NA 136 138 134* 133* 131*  K 3.9 4.2 4.2 3.9 4.0  CL 109 106 103 100 95*  CO2 19* 23 18* 24 25  GLUCOSE 101* 87 100* 159* 150*   BUN 52* 31* 26* 27* 34*  CREATININE 1.31* 1.17 1.30* 1.52* 1.41*  CALCIUM 7.4* 7.9* 7.6* 7.6* 7.3*  MG 2.1 1.9 1.7 1.9 2.1  PHOS 3.1 1.8* 2.7 2.3* 3.4   Estimated Creatinine Clearance: 47.7 mL/min (A) (by C-G formula based on SCr of 1.41 mg/dL (H)). Liver & Pancreas: Recent Labs  Lab 01/04/19 0539 01/05/19 0521 01/06/19 0542 01/07/19 0518 01/08/19 0626  AST 15 16 13* 14* 15  ALT 19 20 18 17 16   ALKPHOS 29* 35* 34* 34* 33*  BILITOT 0.7 0.6 0.8 0.8 0.8  PROT 4.7* 5.3* 5.2* 5.3* 5.2*  ALBUMIN 2.5* 2.6* 2.4* 2.4* 2.2*   No results for input(s): LIPASE, AMYLASE in the last 168 hours. No results for input(s): AMMONIA in the last 168 hours. Diabetic: No results for input(s): HGBA1C in the last 72 hours. Recent Labs  Lab 01/07/19 1201 01/07/19 1718 01/07/19 1948 01/08/19 0740 01/08/19 1203  GLUCAP 214* 302* 248* 129* 133*   Cardiac Enzymes: No results for input(s): CKTOTAL, CKMB, CKMBINDEX, TROPONINI in the last 168 hours. No results for input(s): PROBNP in the last 8760 hours. Coagulation Profile: Recent Labs  Lab 01/02/19 2219  INR 1.2   Thyroid Function Tests: No results for input(s): TSH, T4TOTAL, FREET4, T3FREE, THYROIDAB in the last 72 hours. Lipid Profile: No results for input(s): CHOL, HDL, LDLCALC, TRIG, CHOLHDL, LDLDIRECT in the last 72 hours. Anemia Panel: No results for input(s): VITAMINB12, FOLATE, FERRITIN, TIBC, IRON, RETICCTPCT in the last 72 hours. Urine analysis:    Component Value Date/Time   COLORURINE STRAW (A) 01/03/2019 0046   APPEARANCEUR CLEAR 01/03/2019 0046   LABSPEC 1.008 01/03/2019 0046   PHURINE 5.0 01/03/2019 0046   GLUCOSEU NEGATIVE 01/03/2019 0046   HGBUR MODERATE (A) 01/03/2019 0046   BILIRUBINUR NEGATIVE 01/03/2019 0046   KETONESUR NEGATIVE 01/03/2019 0046   PROTEINUR NEGATIVE 01/03/2019 0046   NITRITE NEGATIVE 01/03/2019 0046   LEUKOCYTESUR NEGATIVE 01/03/2019 0046   Sepsis Labs: Invalid input(s): PROCALCITONIN,  [place]  Microbiology: Recent Results (from the past 240 hour(s))  Blood Culture (routine x 2)     Status: None   Collection Time: 01/02/19 10:49 PM   Specimen: BLOOD  Result Value Ref Range Status   Specimen Description   Final    BLOOD RIGHT ANTECUBITAL Performed at [facility] [address] [place], [place] [address]    Special Requests   Final    BOTTLES DRAWN AEROBIC AND ANAEROBIC Blood Culture adequate volume Performed at [facility], [address] [place], [place] [address]    Culture   Final    NO GROWTH 5 DAYS Performed at [facility], [place] [address], [place], [place] [address]    Report Status 01/08/2019 FINAL  Final  Blood Culture (routine x 2)     Status: None   Collection Time: 01/02/19 10:49 PM   Specimen: BLOOD  Result Value Ref Range Status   Specimen Description   Final    BLOOD LEFT ANTECUBITAL Performed at [facility] [address], [place], [place] [address]    Special Requests   Final    BOTTLES DRAWN AEROBIC AND ANAEROBIC Blood Culture adequate volume Performed at [facility] [address], [place], [place] [address]    Culture   Final    NO GROWTH 5 DAYS Performed at [facility], [place] [address], [place], [place] [address]    Report Status 01/08/2019 FINAL  Final  Urine culture     Status: None   Collection Time: 01/03/19 12:46 AM   Specimen: In/Out Cath Urine  Result Value Ref Range Status   Specimen Description   Final    IN/OUT CATH URINE Performed at [facility] [address], [place], [place] [address]    Special Requests   Final    NONE Performed at [facility], [place] [address], [place], [place] [address]    Culture   Final    NO GROWTH Performed at [facility], [place] [address], [place], [place] [address]    Report Status 01/04/2019 FINAL  Final  SARS Coronavirus 2 [facility] order,  Performed in [facility] hospital lab) Nasopharyngeal Nasopharyngeal Swab     Status: None   Collection Time: 01/03/19 12:46 AM   Specimen: Nasopharyngeal Swab  Result Value Ref Range Status   SARS Coronavirus 2 NEGATIVE NEGATIVE Final    Comment: (NOTE) If result is NEGATIVE SARS-CoV-2 target nucleic acids are NOT DETECTED. The SARS-CoV-2 RNA is generally detectable in upper and lower  respiratory specimens during the acute phase of infection. The lowest  concentration of SARS-CoV-2 viral copies this assay can detect is 250  copies / mL. A negative result does not preclude SARS-CoV-2 infection  and should not be used as the sole basis for treatment or other  patient management decisions.  A negative result may occur with  improper specimen collection / handling, submission of specimen other  than nasopharyngeal swab, presence of viral mutation(s) within the  areas targeted by this assay, and inadequate number of viral copies  (<250 copies / mL). A negative result must be combined with clinical  observations, patient history, and epidemiological information. If result is POSITIVE SARS-CoV-2 target nucleic acids are DETECTED. The SARS-CoV-2 RNA is generally detectable in upper and lower  respiratory specimens dur ing the acute phase of infection.  Positive  results are indicative of active infection with SARS-CoV-2.  Clinical  correlation with patient history and other diagnostic information is  necessary to determine patient infection status.  Positive results do  not rule out bacterial infection or co-infection with other viruses. If result is PRESUMPTIVE POSTIVE SARS-CoV-2 nucleic acids MAY BE PRESENT.   A presumptive positive result was obtained on the submitted specimen  and confirmed on repeat testing.  While 2019 novel coronavirus  (SARS-CoV-2) nucleic acids may be present in the submitted sample  additional confirmatory testing may be necessary for epidemiological  and / or  clinical  management purposes  to differentiate between  SARS-CoV-2 and other Sarbecovirus currently known to infect humans.  If clinically indicated additional testing with an alternate test  methodology [id number]) is advised. The SARS-CoV-2 RNA is generally  detectable in upper and lower respiratory sp ecimens during the acute  phase of infection. The expected result is Negative. Fact Sheet for Patients:  StrictlyIdeas.no Fact Sheet for Healthcare Providers: BankingDealers.co.za This test is not yet approved or cleared by the [place] FDA and has been authorized for detection and/or diagnosis of SARS-CoV-2 by FDA under an Emergency Use Authorization (EUA).  This EUA will remain in effect (meaning this test can be used) for the duration of the COVID-19 declaration under Section 564(b)(1) of the Act, 21 U.S.C. section 360bbb-3(b)(1), unless the authorization is terminated or revoked sooner. Performed at [facility], [place] [address], [place], [place] [address]   MRSA PCR Screening     Status: None   Collection Time: 01/04/19  4:59 PM   Specimen: Nasal Mucosa; Nasopharyngeal  Result Value Ref Range Status   MRSA by PCR NEGATIVE NEGATIVE Final    Comment:        The GeneXpert MRSA Assay (FDA approved for NASAL specimens only), is one component of a comprehensive MRSA colonization surveillance program. It is not intended to diagnose MRSA infection nor to guide or monitor treatment for MRSA infections. Performed at [facility], [place] [address], [place], [place] [address]   Fungus culture, blood     Status: None (Preliminary result)   Collection Time: 01/05/19  2:29 PM   Specimen: BLOOD  Result Value Ref Range Status   Specimen Description   Final    BLOOD BLOOD RIGHT HAND Performed at [facility] [address], [place], [place] [address]    Special Requests   Final     BOTTLES DRAWN AEROBIC ONLY Blood Culture adequate volume Performed at [facility] [address], [place], [place] [address]    Culture   Final    NO GROWTH 3 DAYS Performed at [facility], [place] [address], [place], [place] [address]    Report Status PENDING  Incomplete  Body fluid culture     Status: None (Preliminary result)   Collection Time: 01/07/19 11:47 AM   Specimen: Synovium; Synovial Fluid  Result Value Ref Range Status   Specimen Description   Final    SYNOVIAL LEFT ANKLE Performed at [facility] [address], [place], [place] [address]    Special Requests   Final    Immunocompromised Performed at [facility], [place] [address] [place], [place] [address]    Gram Stain   Final    WBC PRESENT, PREDOMINANTLY PMN NO ORGANISMS SEEN CYTOSPIN Gram Stain Report Called to,Read Back By and Verified With: Jorge Ny RN ON 01/07/19 @ 1310 BY LE Performed at [facility], [place] [address], [place], [place] [address]    Culture   Final    NO GROWTH < 24 HOURS Performed at [facility] [address], [place],  [address]    Report Status PENDING  Incomplete    Radiology Studies: Dg Chest Port 1 View  Result Date: 01/08/2019 CLINICAL DATA:  Shortness of breath EXAM: PORTABLE CHEST 1 VIEW COMPARISON:  01/05/2019 FINDINGS: No significant change in AP portable examination. The lungs are normally aerated. Cardiomegaly status post median sternotomy CABG with left chest multi lead pacer. IMPRESSION:  No acute abnormality of the lungs in AP portable projection. Electronically Signed   By: Eddie Candle M.D.   On: 01/08/2019 08:19    35 minutes with more than 50% spent in reviewing records, counseling patient and coordinating care.  Marinell Igarashi T. [place]  If 7PM-7AM, please contact night-coverage [email or web address] Password [email or web address] 01/08/2019, 3:05 PM

## 2019-01-09 ENCOUNTER — Inpatient Hospital Stay (HOSPITAL_COMMUNITY): Payer: Medicare Other

## 2019-01-09 DIAGNOSIS — M7989 Other specified soft tissue disorders: Secondary | ICD-10-CM

## 2019-01-09 LAB — BASIC METABOLIC PANEL
Anion gap: 13 (ref 5–15)
BUN: 41 mg/dL — ABNORMAL HIGH (ref 8–23)
CO2: 25 mmol/L (ref 22–32)
Calcium: 7.6 mg/dL — ABNORMAL LOW (ref 8.9–10.3)
Chloride: 93 mmol/L — ABNORMAL LOW (ref 98–111)
Creatinine, Ser: 1.32 mg/dL — ABNORMAL HIGH (ref 0.61–1.24)
GFR calc Af Amer: 59 mL/min — ABNORMAL LOW (ref >60)
GFR calc non Af Amer: 51 mL/min — ABNORMAL LOW (ref >60)
Glucose, Bld: 197 mg/dL — ABNORMAL HIGH (ref 70–99)
Potassium: 3.9 mmol/L (ref 3.5–5.1)
Sodium: 131 mmol/L — ABNORMAL LOW (ref 135–145)

## 2019-01-09 LAB — GLUCOSE, CAPILLARY
Glucose-Capillary: 144 mg/dL — ABNORMAL HIGH (ref 70–99)
Glucose-Capillary: 201 mg/dL — ABNORMAL HIGH (ref 70–99)
Glucose-Capillary: 282 mg/dL — ABNORMAL HIGH (ref 70–99)
Glucose-Capillary: 295 mg/dL — ABNORMAL HIGH (ref 70–99)

## 2019-01-09 LAB — CBC
HCT: 25.4 % — ABNORMAL LOW (ref 39.0–52.0)
Hemoglobin: 8.4 g/dL — ABNORMAL LOW (ref 13.0–17.0)
MCH: 32.2 pg (ref 26.0–34.0)
MCHC: 33.1 g/dL (ref 30.0–36.0)
MCV: 97.3 fL (ref 80.0–100.0)
Platelets: 102 10*3/uL — ABNORMAL LOW (ref 150–400)
RBC: 2.61 MIL/uL — ABNORMAL LOW (ref 4.22–5.81)
RDW: 14.4 % (ref 11.5–15.5)
WBC: 6.7 10*3/uL (ref 4.0–10.5)
nRBC: 0 % (ref 0.0–0.2)

## 2019-01-09 LAB — MAGNESIUM: Magnesium: 1.9 mg/dL (ref 1.7–2.4)

## 2019-01-09 NOTE — Progress Notes (Addendum)
Occupational Therapy Treatment Patient Details Name: David [name]. MRN: [id number] DOB: 1940-10-15 Today's Date: 01/09/2019    History of present illness David [name], [place]. is a 78 year old Caucasian male with a past medical history significant for but not limited to history of metastatic malignant melanoma with mets to lungs and brain on immunotherapy, history of prostate cancer, diabetes mellitus type 2, hypothyroidism, history of complete heart block status post pacemaker placement, CAD status post CABG, history of ischemic cardiomyopathy status post ICD and other comorbidities who presented with fevers and chills at home with a temperature of 101.  Family was concerned and brought him to the ER he was found to be neutropenic.   OT comments  Pt progressing towards OT goals, presents supine in bed pleasant and willing to participate in therapy session. Pt completing seated grooming ADL EOB with setup/supervision assist prior to transition OOB to recliner. Pt still with some difficulty standing to RW, requiring modA from elevated EOB today; but was able to perform stand pivot transfer using RW with minA. Pt motivated to return to his PLOF. Will continue to follow acutely and continue per POC.   Follow Up Recommendations  SNF;Home health OT;Supervision/Assistance - 24 hour(depending on available assist at home)    Equipment Recommendations  3 in 1 bedside commode    Recommendations for Other Services      Precautions / Restrictions Precautions Precautions: Fall Restrictions Weight Bearing Restrictions: No       Mobility Bed Mobility Overal bed mobility: Needs Assistance Bed Mobility: Supine to Sit     Supine to sit: Min guard;HOB elevated     General bed mobility comments: use of bedrail with HOB elevated, minguard for safety and increased time required but no physical assist  Transfers Overall transfer level: Needs assistance Equipment used: Rolling walker (2  wheeled) Transfers: Sit to/from [facility] Sit to Stand: Mod assist;From elevated surface Stand pivot transfers: Min assist       General transfer comment: pt requires boosting assist from elevated EOB, VCs for hand placement as pt opting to pull up on RW; steadying assist to take few steps to turn towards recliner    Balance Overall balance assessment: Needs assistance Sitting-balance support: Feet supported Sitting balance-Leahy Scale: Fair     Standing balance support: Bilateral upper extremity supported Standing balance-Leahy Scale: Poor Standing balance comment: reliant on UE support/external assist                           ADL either performed or assessed with clinical judgement   ADL Overall ADL's : Needs assistance/impaired     Grooming: Set up;Sitting;Wash/dry face;Oral care Grooming Details (indicate cue type and reason): seated EOB                             Functional mobility during ADLs: Moderate assistance;Minimal assistance;Rolling walker General ADL Comments: pt completing grooming ADL seated EOB then assisted with transition to recliner; continues to require increased assist for boosting/standing     Vision       Perception     Praxis      Cognition Arousal/Alertness: Awake/alert Behavior During Therapy: WFL for tasks assessed/performed Overall Cognitive Status: Within Functional Limits for tasks assessed  General Comments: pleasant        Exercises     Shoulder Instructions       General Comments      Pertinent Vitals/ Pain       Pain Assessment: No/denies pain Pain Location: reports improvements in pain of bil feet   Home Living                                          Prior Functioning/Environment              Frequency  Min 2X/week        Progress Toward Goals  OT Goals(current goals can now be found in the care  plan section)  Progress towards OT goals: Progressing toward goals  Acute Rehab OT Goals Patient Stated Goal: get stronger ADL Goals Pt Will Perform Grooming: with supervision;standing Pt Will Perform Lower Body Dressing: with supervision;sit to/from stand Pt Will Transfer to Toilet: with supervision;regular height toilet;ambulating Pt Will Perform Toileting - Clothing Manipulation and hygiene: with supervision;sit to/from stand  Plan Discharge plan remains appropriate    Co-evaluation                 AM-PAC OT "6 Clicks" Daily Activity     Outcome Measure   Help from another person eating meals?: None Help from another person taking care of personal grooming?: None(in sitting) Help from another person toileting, which includes using toliet, bedpan, or urinal?: A Lot Help from another person bathing (including washing, rinsing, drying)?: A Lot Help from another person to put on and taking off regular upper body clothing?: A Little Help from another person to put on and taking off regular lower body clothing?: A Lot 6 Click Score: 17    End of Session Equipment Utilized During Treatment: Gait belt;Rolling walker  OT Visit Diagnosis: Unsteadiness on feet (R26.81);Muscle weakness (generalized) (M62.81)   Activity Tolerance Patient tolerated treatment well   Patient Left in chair;with call bell/phone within reach   Nurse Communication Mobility status        Time: 4709-6283 OT Time Calculation (min): 20 min  Charges: OT General Charges $OT Visit: 1 Visit OT Treatments $Self Care/Home Management : 8-22 mins  Lou Cal, [place] Pager [phone] Office [place] 01/09/2019, 10:17 AM

## 2019-01-09 NOTE — TOC Progression Note (Signed)
Transition of Care (TOC) - Progression Note    Patient Details  Name: Flavio Lindroth. MRN: [id number] Date of Birth: November 18, 1940  Transition of Care [facility]) CM/SW Contact  Nila Nephew, [place] Phone Number: [phone] 01/09/2019, 3:03 PM  Clinical Narrative:  Pt will resume home health care with [facility] at DC (PT/OT/RN/aide). Pt's daughter has arranged for increased private caregiver supervision in the days following DC to assist pt and his wife. Daughter expresses goal is to keep pt healthy at home and prevent readmission. Expressed feels assured with plans in place and appreciates providers' input/plans during this hospitalization.    Expected Discharge Plan: (TBD, anticipate home with home health) Barriers to Discharge: No Barriers Identified  Expected Discharge Plan and Services Expected Discharge Plan: (TBD, anticipate home with home health) In-house Referral: Clinical Social Work Discharge Planning Services: CM Consult     Expected Discharge Date: (unknown)               DME Arranged: 3-N-1 DME Agency: [facility] Date DME Agency Contacted: 01/09/19 Time DME Agency Contacted: [id number] Representative spoke with at DME Agency: Gerald Arranged: RN, PT, OT, Nurse's Aide [place] Agency: [place] ([place]) Date [place]: 01/06/19 Time [place]: [place] Representative spoke with at [place]: [place] ([place]) Interventions    Readmission Risk Interventions Readmission Risk Prevention Plan 12/18/2018  Transportation Screening Complete  Medication Review Press photographer) Complete  PCP or Specialist appointment within 3-5 days of discharge Not Complete  PCP/Specialist Appt Not Complete comments still arranging disposition  [place] or [place] Complete  SW Recovery Care/Counseling Consult Complete  [place] Patient Refused  Some recent data  might be hidden

## 2019-01-09 NOTE — Progress Notes (Signed)
Physical Therapy Treatment Patient Details Name: David [name]. MRN: [id number] DOB: 10-03-1940 Today's Date: 01/09/2019    History of Present Illness David [name], David [name]. is a 78 year old Caucasian male with a past medical history significant for but not limited to history of metastatic malignant melanoma with mets to lungs and brain on immunotherapy, history of prostate cancer, diabetes mellitus type 2, hypothyroidism, history of complete heart block status post pacemaker placement, CAD status post CABG, history of ischemic cardiomyopathy status post ICD and other comorbidities who presented with fevers and chills at home with a temperature of 101.  Family was concerned and brought him to the ER he was found to be neutropenic.    PT Comments    Pt progressing well. General Gait Details: pt tolerated an increased distance.  Did require x 3 brief standing rest breaks.  Too unsteady without walker so def need for AD.  Decreased c/o B foot pain "better"   Follow Up Recommendations  Home health PT(daughter stated she plans to have pt D/C to home)     Equipment Recommendations  None recommended by PT    Recommendations for Other Services       Precautions / Restrictions Precautions Precautions: Fall Restrictions Weight Bearing Restrictions: No    Mobility  Bed Mobility               General bed mobility comments: OOB in recliner  Transfers Overall transfer level: Needs assistance Equipment used: Rolling walker (2 wheeled) Transfers: Sit to/from [facility] Sit to Stand: Supervision;Min guard Stand pivot transfers: Min guard;Min assist       General transfer comment: increased time with need to steady self B UE's  Ambulation/Gait Ambulation/Gait assistance: Supervision;Min guard Gait Distance (Feet): 145 Feet Assistive device: Rolling walker (2 wheeled) Gait Pattern/deviations: Trunk flexed;Decreased step length - right;Decreased stance time -  right Gait velocity: decreased   General Gait Details: pt tolerated an increased distance.  Did require x 3 brief standing rest breaks.  Too unsteady without walker so def need for AD.  Decreased c/o B foot pain "better"   Stairs             Wheelchair Mobility    Modified Rankin (Stroke Patients Only)       Balance                                            Cognition Arousal/Alertness: Awake/alert Behavior During Therapy: WFL for tasks assessed/performed Overall Cognitive Status: Within Functional Limits for tasks assessed                                        Exercises      General Comments        Pertinent Vitals/Pain Pain Assessment: Faces Faces Pain Scale: Hurts a little bit Pain Location: "improving" B foot pain poss Gout    Home Living                      Prior Function            PT Goals (current goals can now be found in the care plan section)      Frequency           PT Plan Discharge plan  needs to be updated;Frequency needs to be updated    Co-evaluation              AM-PAC PT "6 Clicks" Mobility   Outcome Measure  Help needed turning from your back to your side while in a flat bed without using bedrails?: A Little Help needed moving from lying on your back to sitting on the side of a flat bed without using bedrails?: A Little Help needed moving to and from a bed to a chair (including a wheelchair)?: A Little Help needed standing up from a chair using your arms (e.g., wheelchair or bedside chair)?: A Little Help needed to walk in hospital room?: A Little Help needed climbing 3-5 steps with a railing? : A Little 6 Click Score: 18    End of Session Equipment Utilized During Treatment: Gait belt Activity Tolerance: Patient tolerated treatment well Patient left: with call bell/phone within reach;in chair;with chair alarm set   PT Visit Diagnosis: Unsteadiness on feet  (R26.81);Muscle weakness (generalized) (M62.81);Pain     Time: 1136-1202 PT Time Calculation (min) (ACUTE ONLY): 26 min  Charges:  $Gait Training: 8-22 mins $Therapeutic Activity: 8-22 mins                     Rica Koyanagi  PTA Acute  Rehabilitation Services Pager      [phone] Office      [phone]

## 2019-01-09 NOTE — Progress Notes (Signed)
PROGRESS NOTE  David [name]. ZOX:[id number] DOB: Nov 17, 1940   PCP: [name], MD  Patient is from: Home  DOA: 01/02/2019 LOS: 6  Brief Narrative / Interim history: 78 year old male with history of metastatic malignant melanoma on immunotherapy, prostate cancer, DM-2, hypothyroidism, heart block/PPM, CAD/CABG, ICM/ICD presenting with fever and chills and admitted with neutropenic fever likely due to pneumonia.  Started on broad-spectrum antibiotics.  Medical oncology, ID and palliative care consulted.  After evaluation by ID, fever thought to be due to gout.  Arthrocentesis by Ortho.  Fluid analysis was consistent with gout.  Antibiotics discontinued and he was started on prednisone with significant improvement in his pain..   Patient had bone marrow biopsy on 9/30.  Preliminary on bone biopsy concerning for MDS.  Subjective: No major events overnight of this morning.  Reports improvement in his ankle pain.  Was able to ambulate some using walker this morning.  Remains afebrile.  Leukopenia improved with Granix.  Renal function improving.  Hemoglobin and platelets stable.  Cultures negative so far.  Objective: Vitals:   01/08/19 1917 01/08/19 2101 01/08/19 2314 01/09/19 0610  BP: 124/82 (!) 156/95  120/76  Pulse: 75 78 79 83  Resp: _0 Temp: 98.5 F (36.9 C) 98 F (36.7 C)  97.7 F (36.5 C)  TempSrc:  Oral  Oral  SpO2: 94% 96% 95% 98%  Weight:      Height:        Intake/Output Summary (Last 24 hours) at 01/09/2019 1332 Last data filed at 01/09/2019 0611 Gross per 24 hour  Intake 4794.72 ml  Output 2850 ml  Net 1944.72 ml   Filed Weights   01/03/19 0437  Weight: 90.7 kg    Examination:  GENERAL: No acute distress.  Lying comfortably. HEENT: MMM.  Vision and hearing grossly intact.  NECK: Supple.  No apparent JVD.  RESP:  No IWOB. Good air movement bilaterally. CVS:  RRR. Heart sounds normal.  ABD/GI/GU: Bowel sounds present. Soft. Non tender.   MSK/EXT:  Moves extremities. No apparent deformity or edema.  No tenderness to palpation today. SKIN: no apparent skin lesion or wound NEURO: Awake, alert and oriented fairly..  No gross deficit.  PSYCH: Calm. Normal affect.   Assessment & Plan: Febrile neutropenia-neutropenia resolved with Granix. -Initially concern about pneumonia based on chest x-ray which has been ruled out. -Extensive work-up including CT abdomen and pelvis, and cultures without significant finding.  -Has bilateral ankle swelling and pain.  Fluid study consistent with gout.  Culture and Gram stain negative. -Observing off antibiotic per ID.  Remains afebrile. -Continue prednisone for gout-will taper slowly on discharge. -Granix per oncology  Acute gout flareup: patient with bilateral ankle swelling and pain-improving. -Arthrocentesis consistent with gout.  Gram stain and cultures negative. -Left ankle x-ray with some bony irregularities at the tip of the medial malleolus. -Status post intra-articular Depo-Medrol injection by Ortho -On colchicine and high-dose prednisone. -Will slowly taper prednisone and start allopurinol on discharge.  Pancytopenia/MDS:  -Leukopenia resolved with Granix. -Anemia and thrombocytopenia stable. -Bone marrow biopsy on 9/30 reveals MDS.  Metastatic malignant melanoma/history of prostate cancer -Oncology on board.  Left upper extremity swelling: No tenderness or increased warmth to touch.  Venous Doppler negative for DVT. -Continue monitoring  Essential hypertension: Normotensive. -Continue current regimen  History of CAD/CABG/ICM s/p ICD: Stable.  No cardiopulmonary symptoms. -Continue home aspirin  DM-2 with CKD-3: CBG within fair range. -Continue current regimen  CKD-3/BMD/hyponatremia: Stable. -Avoid nephrotoxic meds  History of complete heart block s/p PPM: Stable  Hypothyroidism -Continue home Synthroid  LUTS: Reports hesitancy.  Has history of prostate cancer.   Has excellent urine output.  AAA: CT scan redemonstrated 3.1 cm infrarenal AAA. -Recommended outpatient follow-up  Goal of care: Patient with multiple comorbidities.  -Palliative care on board.  DVT prophylaxis: Subcu Lovenox Code Status: Full code Family Communication: Updated patient's daughter Disposition Plan: Will be discharged home with home health and DME on 01/10/2019 Consultants: Oncology, infectious disease, orthopedic surgery, palliative care  Procedures:  Arthrocentesis Bone marrow biopsy  Microbiology summarized: COVID-19 negative. Blood cultures negative. Synovial fluid stain and culture negative. MRSA PCR negative.  Antimicrobials: Anti-infectives (From admission, onward)   Start     Dose/Rate Route Frequency Ordered Stop   01/07/19 1000  meropenem (MERREM) 1 g in sodium chloride 0.9 % 100 mL IVPB  Status:  Discontinued     1 g 200 mL/hr over 30 Minutes Intravenous Every 12 hours 01/07/19 0909 01/07/19 0937   01/06/19 0800  vancomycin (VANCOCIN) 1,500 mg in sodium chloride 0.9 % 500 mL IVPB  Status:  Discontinued     1,500 mg 250 mL/hr over 120 Minutes Intravenous Every 24 hours 01/05/19 1618 01/05/19 1710   01/05/19 2000  doxycycline (VIBRAMYCIN) 100 mg in sodium chloride 0.9 % 250 mL IVPB  Status:  Discontinued     100 mg 125 mL/hr over 120 Minutes Intravenous Every 12 hours 01/05/19 1825 01/07/19 0937   01/05/19 1800  meropenem (MERREM) 1 g in sodium chloride 0.9 % 100 mL IVPB  Status:  Discontinued     1 g 200 mL/hr over 30 Minutes Intravenous Every 8 hours 01/05/19 0907 01/07/19 0909   01/05/19 0930  meropenem (MERREM) 1 g in sodium chloride 0.9 % 100 mL IVPB     1 g 200 mL/hr over 30 Minutes Intravenous STAT 01/05/19 0906 01/05/19 0951   01/04/19 1200  vancomycin (VANCOCIN) 1,250 mg in sodium chloride 0.9 % 250 mL IVPB  Status:  Discontinued     1,250 mg 166.7 mL/hr over 90 Minutes Intravenous Every 24 hours 01/03/19 0657 01/05/19 1618   01/03/19 0700   vancomycin (VANCOCIN) 2,000 mg in sodium chloride 0.9 % 500 mL IVPB     2,000 mg 250 mL/hr over 120 Minutes Intravenous  Once 01/03/19 0657 01/03/19 1350   01/03/19 0600  azithromycin (ZITHROMAX) 500 mg in sodium chloride 0.9 % 250 mL IVPB  Status:  Discontinued     500 mg 250 mL/hr over 60 Minutes Intravenous Every 24 hours 01/03/19 0521 01/05/19 1825   01/03/19 0600  ceFEPIme (MAXIPIME) 2 g in sodium chloride 0.9 % 100 mL IVPB  Status:  Discontinued     2 g 200 mL/hr over 30 Minutes Intravenous Every 12 hours 01/03/19 0529 01/05/19 0808   01/02/19 2230  piperacillin-tazobactam (ZOSYN) IVPB 3.375 g     3.375 g 100 mL/hr over 30 Minutes Intravenous  Once 01/02/19 2220 01/03/19 0136      Sch Meds:  Scheduled Meds: . sodium chloride   Intravenous Once  . aspirin EC  81 mg Oral QPM  . bupivacaine  10 mL Infiltration Once  . citalopram  10 mg Oral Daily  . colchicine  0.6 mg Oral BID  . gabapentin  100 mg Oral BID  . guaiFENesin  1,200 mg Oral BID  . insulin aspart  0-9 Units Subcutaneous TID WC  . levothyroxine  137 mcg Oral Q0600  . methylphenidate  5 mg Oral  BID WC  . methylPREDNISolone acetate  80 mg Intra-articular Once  . polyethylene glycol  17 g Oral BID  . predniSONE  60 mg Oral Q breakfast  . senna-docusate  1 tablet Oral BID   Continuous Infusions: . sodium bicarbonate 150 mEq in dextrose 5% 1000 mL 150 mEq (01/08/19 2349)   PRN Meds:.acetaminophen **OR** acetaminophen, levalbuterol, ondansetron **OR** ondansetron (ZOFRAN) IV, oxyCODONE, traZODone   I have personally reviewed the following labs and images: CBC: Recent Labs  Lab 01/04/19 0539 01/05/19 0521 01/06/19 0542 01/07/19 0518 01/08/19 0626 01/09/19 0809  WBC 0.7* 2.9* 2.7* 2.2* 0.9* 6.7  NEUTROABS 0.4* 2.5 2.0 1.1* 0.4*  --   HGB 8.2* 9.6* 9.5* 9.5* 8.4* 8.4*  HCT 24.6* 28.9* 29.1* 28.6* 25.8* 25.4*  MCV 98.4 98.6 100.3* 99.0 99.6 97.3  PLT 121* 132* 139* 125* 101* 102*   BMP &GFR Recent Labs   Lab 01/04/19 0539 01/05/19 0521 01/06/19 0542 01/07/19 0518 01/08/19 0626 01/09/19 0533  NA 136 138 134* 133* 131* 131*  K 3.9 4.2 4.2 3.9 4.0 3.9  CL 109 106 103 100 95* 93*  CO2 19* 23 18* _0 GLUCOSE 101* 87 100* 159* 150* 197*  BUN 52* 31* 26* 27* 34* 41*  CREATININE 1.31* 1.17 1.30* 1.52* 1.41* 1.32*  CALCIUM 7.4* 7.9* 7.6* 7.6* 7.3* 7.6*  MG 2.1 1.9 1.7 1.9 2.1 1.9  PHOS 3.1 1.8* 2.7 2.3* 3.4  --    Estimated Creatinine Clearance: 51 mL/min (A) (by C-G formula based on SCr of 1.32 mg/dL (H)). Liver & Pancreas: Recent Labs  Lab 01/04/19 0539 01/05/19 0521 01/06/19 0542 01/07/19 0518 01/08/19 0626  AST 15 16 13* 14* 15  ALT _1 ALKPHOS 29* 35* 34* 34* 33*  BILITOT 0.7 0.6 0.8 0.8 0.8  PROT 4.7* 5.3* 5.2* 5.3* 5.2*  ALBUMIN 2.5* 2.6* 2.4* 2.4* 2.2*   No results for input(s): LIPASE, AMYLASE in the last 168 hours. No results for input(s): AMMONIA in the last 168 hours. Diabetic: No results for input(s): HGBA1C in the last 72 hours. Recent Labs  Lab 01/08/19 1203 01/08/19 1703 01/08/19 2104 01/09/19 0745 01/09/19 1206  GLUCAP 133* 195* 295* 144* 201*   Cardiac Enzymes: No results for input(s): CKTOTAL, CKMB, CKMBINDEX, TROPONINI in the last 168 hours. No results for input(s): PROBNP in the last 8760 hours. Coagulation Profile: Recent Labs  Lab 01/02/19 2219  INR 1.2   Thyroid Function Tests: No results for input(s): TSH, T4TOTAL, FREET4, T3FREE, THYROIDAB in the last 72 hours. Lipid Profile: No results for input(s): CHOL, HDL, LDLCALC, TRIG, CHOLHDL, LDLDIRECT in the last 72 hours. Anemia Panel: No results for input(s): VITAMINB12, FOLATE, FERRITIN, TIBC, IRON, RETICCTPCT in the last 72 hours. Urine analysis:    Component Value Date/Time   COLORURINE STRAW (A) 01/03/2019 0046   APPEARANCEUR CLEAR 01/03/2019 0046   LABSPEC 1.008 01/03/2019 0046   PHURINE 5.0 01/03/2019 0046   GLUCOSEU NEGATIVE 01/03/2019 0046   HGBUR MODERATE  (A) 01/03/2019 0046   BILIRUBINUR NEGATIVE 01/03/2019 0046   KETONESUR NEGATIVE 01/03/2019 0046   PROTEINUR NEGATIVE 01/03/2019 0046   NITRITE NEGATIVE 01/03/2019 0046   LEUKOCYTESUR NEGATIVE 01/03/2019 0046   Sepsis Labs: Invalid input(s): PROCALCITONIN, [place]  Microbiology: Recent Results (from the past 240 hour(s))  Blood Culture (routine x 2)     Status: None   Collection Time: 01/02/19 10:49 PM   Specimen: BLOOD  Result Value Ref Range Status   Specimen Description  Final    BLOOD RIGHT ANTECUBITAL Performed at [facility] [address] [place], [place] [address]    Special Requests   Final    BOTTLES DRAWN AEROBIC AND ANAEROBIC Blood Culture adequate volume Performed at [facility] [address], [place], [place] [address]    Culture   Final    NO GROWTH 5 DAYS Performed at [facility], [place] [address] [place], [place] [address]    Report Status 01/08/2019 FINAL  Final  Blood Culture (routine x 2)     Status: None   Collection Time: 01/02/19 10:49 PM   Specimen: BLOOD  Result Value Ref Range Status   Specimen Description   Final    BLOOD LEFT ANTECUBITAL Performed at [facility] [address] [place], [place] [address]    Special Requests   Final    BOTTLES DRAWN AEROBIC AND ANAEROBIC Blood Culture adequate volume Performed at [facility] [address] [place], [place] [address]    Culture   Final    NO GROWTH 5 DAYS Performed at [facility], [place] [address] [place], [place] [address]    Report Status 01/08/2019 FINAL  Final  Urine culture     Status: None   Collection Time: 01/03/19 12:46 AM   Specimen: In/Out Cath Urine  Result Value Ref Range Status   Specimen Description   Final    IN/OUT CATH URINE Performed at [facility] [address], [place], [place] [address]    Special Requests   Final    NONE Performed at [facility], [place] [address] [place], [place] [address]    Culture   Final    NO GROWTH Performed at [facility], [place] [address], [place],  [address]    Report Status 01/04/2019 FINAL  Final  SARS Coronavirus 2 [facility] order, Performed in [facility] hospital lab) Nasopharyngeal Nasopharyngeal Swab     Status: None   Collection Time: 01/03/19 12:46 AM   Specimen: Nasopharyngeal Swab  Result Value Ref Range Status   SARS Coronavirus 2 NEGATIVE NEGATIVE Final    Comment: (NOTE) If result is NEGATIVE SARS-CoV-2 target nucleic acids are NOT DETECTED. The SARS-CoV-2 RNA is generally detectable in upper and lower  respiratory specimens during the acute phase of infection. The lowest  concentration of SARS-CoV-2 viral copies this assay can detect is 250  copies / mL. A negative result does not preclude SARS-CoV-2 infection  and should not be used as the sole basis for treatment or other  patient management decisions.  A negative result may occur with  improper specimen collection / handling, submission of specimen other  than nasopharyngeal swab, presence of viral mutation(s) within the  areas targeted by this assay, and inadequate number of viral copies  (<250 copies / mL). A negative result must be combined with clinical  observations, patient history, and epidemiological information. If result is POSITIVE SARS-CoV-2 target nucleic acids are DETECTED. The SARS-CoV-2 RNA is generally detectable in upper and lower  respiratory specimens dur ing the acute phase of infection.  Positive  results are indicative of active infection with SARS-CoV-2.  Clinical  correlation with patient history and other diagnostic information is  necessary to determine patient infection status.  Positive results do  not rule out bacterial infection or co-infection with other viruses. If result is PRESUMPTIVE POSTIVE SARS-CoV-2 nucleic acids MAY BE PRESENT.  A presumptive positive  result was obtained on the submitted specimen  and confirmed on repeat testing.  While 2019 novel coronavirus  (SARS-CoV-2) nucleic acids may be present in the submitted sample  additional confirmatory testing may be necessary for epidemiological  and / or clinical management purposes  to differentiate between  SARS-CoV-2 and other Sarbecovirus currently known to infect humans.  If clinically indicated additional testing with an alternate test  methodology [phone]) is advised. The SARS-CoV-2 RNA is generally  detectable in upper and lower respiratory sp ecimens during the acute  phase of infection. The expected result is Negative. Fact Sheet for Patients:  StrictlyIdeas.no Fact Sheet for Healthcare Providers: BankingDealers.co.za This test is not yet approved or cleared by the [place] FDA and has been authorized for detection and/or diagnosis of SARS-CoV-2 by FDA under an Emergency Use Authorization (EUA).  This EUA will remain in effect (meaning this test can be used) for the duration of the COVID-19 declaration under Section 564(b)(1) of the Act, 21 U.S.C. section 360bbb-3(b)(1), unless the authorization is terminated or revoked sooner. Performed at [facility], [place] [address] [place], [place] [address]   MRSA PCR Screening     Status: None   Collection Time: 01/04/19  4:59 PM   Specimen: Nasal Mucosa; Nasopharyngeal  Result Value Ref Range Status   MRSA by PCR NEGATIVE NEGATIVE Final    Comment:        The GeneXpert MRSA Assay (FDA approved for NASAL specimens only), is one component of a comprehensive MRSA colonization surveillance program. It is not intended to diagnose MRSA infection nor to guide or monitor treatment for MRSA infections. Performed at [facility], [place] [address], [place], [place] [address]   Fungus culture, blood     Status: None (Preliminary result)    Collection Time: 01/05/19  2:29 PM   Specimen: BLOOD  Result Value Ref Range Status   Specimen Description   Final    BLOOD BLOOD RIGHT HAND Performed at [facility] [address], [place], [place] [address]    Special Requests   Final    BOTTLES DRAWN AEROBIC ONLY Blood Culture adequate volume Performed at [facility] [address] [place], [place] [address]    Culture   Final    NO GROWTH 4 DAYS Performed at [facility], [place] [address], [place], [place] [address]    Report Status PENDING  Incomplete  Body fluid culture     Status: None (Preliminary result)   Collection Time: 01/07/19 11:47 AM   Specimen: Synovium; Synovial Fluid  Result Value Ref Range Status   Specimen Description   Final    SYNOVIAL LEFT ANKLE Performed at [facility] [address], [place], [place] [address]    Special Requests   Final    Immunocompromised Performed at [facility], [place] [address], [place], [place] [address]    Gram Stain   Final    WBC PRESENT, PREDOMINANTLY PMN NO ORGANISMS SEEN CYTOSPIN Gram Stain Report Called to,Read Back By and Verified With: Jorge Ny RN ON 01/07/19 @ 1310 BY LE Performed at [facility], [place] [address], [place], [place] [address]    Culture   Final    NO GROWTH 2 DAYS Performed at [facility] [address] [place],  [address]    Report Status PENDING  Incomplete    Radiology Studies: Vas [place] Upper  Extremity Venous Duplex  Result Date: 01/09/2019 UPPER VENOUS STUDY  Indications: Swelling Risk Factors: None identified. Comparison Study: No prior studies. Performing Technologist: Oliver Hum RVT  Examination Guidelines: A complete evaluation includes B-mode imaging, spectral Doppler, color Doppler, and power Doppler as needed of all accessible portions of each vessel. Bilateral testing is considered an integral part of a complete  examination. Limited examinations for reoccurring indications may be performed as noted.  Right Findings: +----------+------------+---------+-----------+----------+-------+ RIGHT     CompressiblePhasicitySpontaneousPropertiesSummary +----------+------------+---------+-----------+----------+-------+ Subclavian    Full       Yes       Yes                      +----------+------------+---------+-----------+----------+-------+  Left Findings: +----------+------------+---------+-----------+----------+-----------------+ LEFT      CompressiblePhasicitySpontaneousProperties     Summary      +----------+------------+---------+-----------+----------+-----------------+ IJV           Full       Yes       Yes                                +----------+------------+---------+-----------+----------+-----------------+ Subclavian    Full       No        No                                 +----------+------------+---------+-----------+----------+-----------------+ Axillary      Full       Yes       Yes                                +----------+------------+---------+-----------+----------+-----------------+ Brachial      Full       Yes       Yes                                +----------+------------+---------+-----------+----------+-----------------+ Radial        Full                                                    +----------+------------+---------+-----------+----------+-----------------+ Ulnar         Full                                                    +----------+------------+---------+-----------+----------+-----------------+ Cephalic    Partial                                 Age Indeterminate +----------+------------+---------+-----------+----------+-----------------+ Basilic       Full                                                    +----------+------------+---------+-----------+----------+-----------------+  Summary:  Right: No evidence  of thrombosis in the subclavian.  Left: No evidence of deep vein thrombosis in the upper extremity. Findings consistent with age indeterminate superficial vein thrombosis involving the left cephalic vein.  *See table(s) above for measurements and observations.    Preliminary     35 minutes with more than 50% spent in reviewing records, counseling patient and coordinating care.  Eriana Suliman T. [place]  If 7PM-7AM, please contact night-coverage [email or web address] Password [email or web address] 01/09/2019, 1:32 PM

## 2019-01-09 NOTE — Progress Notes (Signed)
Left upper extremity venous duplex has been completed. Preliminary results can be found in CV Proc through chart review.  Results were given to the patient's nurse, Estill Cotta.  01/09/19 9:33 AM [name] RVT

## 2019-01-09 NOTE — Progress Notes (Signed)
[facility]   Reason for visit: Follow up on fever  Interval History: cultures remain negative, synovium with urate crystals.  Remains afebrile.  No complaints.   Remains off of antibiotics.  WBC 6.7  Physical Exam: Constitutional:  Vitals:   01/09/19 0610 01/09/19 1413  BP: 120/76 133/84  Pulse: 83 88  Resp: 18 20  Temp: 97.7 F (36.5 C) 98.1 F (36.7 C)  SpO2: 98% 99%   patient appears in NAD Respiratory: Normal respiratory effort; CTA B Cardiovascular: RRR GI: soft, nt, nd  Review of Systems: Constitutional: negative for fevers and chills Respiratory: negative for cough or sputum Gastrointestinal: negative for nausea and diarrhea  Lab Results  Component Value Date   WBC 6.7 01/09/2019   HGB 8.4 (L) 01/09/2019   HCT 25.4 (L) 01/09/2019   MCV 97.3 01/09/2019   PLT 102 (L) 01/09/2019    Lab Results  Component Value Date   CREATININE 1.32 (H) 01/09/2019   BUN 41 (H) 01/09/2019   NA 131 (L) 01/09/2019   K 3.9 01/09/2019   CL 93 (L) 01/09/2019   CO2 25 01/09/2019    Lab Results  Component Value Date   ALT 16 01/08/2019   AST 15 01/08/2019   ALKPHOS 33 (L) 01/08/2019     Microbiology: Recent Results (from the past 240 hour(s))  Blood Culture (routine x 2)     Status: None   Collection Time: 01/02/19 10:49 PM   Specimen: BLOOD  Result Value Ref Range Status   Specimen Description   Final    BLOOD RIGHT ANTECUBITAL Performed at [facility], [place] [address] [place], [place] [address]    Special Requests   Final    BOTTLES DRAWN AEROBIC AND ANAEROBIC Blood Culture adequate volume Performed at [facility] [address] [place], [place] [address]    Culture   Final    NO GROWTH 5 DAYS Performed at [facility], [place] [address], [place], [place] [address]    Report Status 01/08/2019 FINAL  Final  Blood Culture (routine x 2)     Status: None   Collection Time: 01/02/19 10:49 PM    Specimen: BLOOD  Result Value Ref Range Status   Specimen Description   Final    BLOOD LEFT ANTECUBITAL Performed at [place] [address], [place], [place] [address]    Special Requests   Final    BOTTLES DRAWN AEROBIC AND ANAEROBIC Blood Culture adequate volume Performed at [facility] [place] [address] [place], [place] [address]    Culture   Final    NO GROWTH 5 DAYS Performed at [facility], [place] [address] [place], [place] [address]    Report Status 01/08/2019 FINAL  Final  Urine culture     Status: None   Collection Time: 01/03/19 12:46 AM   Specimen: In/Out Cath Urine  Result Value Ref Range Status   Specimen Description   Final    IN/OUT CATH URINE Performed at [facility] [address] [place], [place] [address]    Special Requests   Final    NONE Performed at [facility], [place] [address], [place], [place] [address]    Culture   Final    NO GROWTH Performed at [facility], [place] [address] [place], [place] [address]    Report Status 01/04/2019 FINAL  Final  SARS Coronavirus 2 (  Hospital order, Performed in [facility] hospital lab) Nasopharyngeal Nasopharyngeal Swab     Status: None   Collection Time: 01/03/19 12:46 AM   Specimen: Nasopharyngeal Swab  Result Value Ref Range Status   SARS Coronavirus 2 NEGATIVE NEGATIVE Final    Comment: (NOTE) If result is NEGATIVE SARS-CoV-2 target nucleic acids are NOT DETECTED. The SARS-CoV-2 RNA is generally detectable in upper and lower  respiratory specimens during the acute phase of infection. The lowest  concentration of SARS-CoV-2 viral copies this assay can detect is 250  copies / mL. A negative result does not preclude SARS-CoV-2 infection  and should not be used as the sole basis for treatment or other  patient management decisions.  A negative result may occur with  improper specimen collection / handling, submission  of specimen other  than nasopharyngeal swab, presence of viral mutation(s) within the  areas targeted by this assay, and inadequate number of viral copies  (<250 copies / mL). A negative result must be combined with clinical  observations, patient history, and epidemiological information. If result is POSITIVE SARS-CoV-2 target nucleic acids are DETECTED. The SARS-CoV-2 RNA is generally detectable in upper and lower  respiratory specimens dur ing the acute phase of infection.  Positive  results are indicative of active infection with SARS-CoV-2.  Clinical  correlation with patient history and other diagnostic information is  necessary to determine patient infection status.  Positive results do  not rule out bacterial infection or co-infection with other viruses. If result is PRESUMPTIVE POSTIVE SARS-CoV-2 nucleic acids MAY BE PRESENT.   A presumptive positive result was obtained on the submitted specimen  and confirmed on repeat testing.  While 2019 novel coronavirus  (SARS-CoV-2) nucleic acids may be present in the submitted sample  additional confirmatory testing may be necessary for epidemiological  and / or clinical management purposes  to differentiate between  SARS-CoV-2 and other Sarbecovirus currently known to infect humans.  If clinically indicated additional testing with an alternate test  methodology [phone]) is advised. The SARS-CoV-2 RNA is generally  detectable in upper and lower respiratory sp ecimens during the acute  phase of infection. The expected result is Negative. Fact Sheet for Patients:  StrictlyIdeas.no Fact Sheet for Healthcare Providers: BankingDealers.co.za This test is not yet approved or cleared by the [place] FDA and has been authorized for detection and/or diagnosis of SARS-CoV-2 by FDA under an Emergency Use Authorization (EUA).  This EUA will remain in effect (meaning this test can be used) for  the duration of the COVID-19 declaration under Section 564(b)(1) of the Act, 21 U.S.C. section 360bbb-3(b)(1), unless the authorization is terminated or revoked sooner. Performed at [facility], [place] [address], [place], [place] [address]   MRSA PCR Screening     Status: None   Collection Time: 01/04/19  4:59 PM   Specimen: Nasal Mucosa; Nasopharyngeal  Result Value Ref Range Status   MRSA by PCR NEGATIVE NEGATIVE Final    Comment:        The GeneXpert MRSA Assay (FDA approved for NASAL specimens only), is one component of a comprehensive MRSA colonization surveillance program. It is not intended to diagnose MRSA infection nor to guide or monitor treatment for MRSA infections. Performed at [facility], [place] [address], [place], [place] [address]   Fungus culture, blood     Status: None (Preliminary result)   Collection Time: 01/05/19  2:29 PM   Specimen: BLOOD  Result Value Ref Range Status  Specimen Description   Final    BLOOD BLOOD RIGHT HAND Performed at [place] [address], [place], [place] [address]    Special Requests   Final    BOTTLES DRAWN AEROBIC ONLY Blood Culture adequate volume Performed at [place] [address] [place], [place] [address]    Culture   Final    NO GROWTH 4 DAYS Performed at [facility], [place] [address], [place], [place] [address]    Report Status PENDING  Incomplete  Body fluid culture     Status: None (Preliminary result)   Collection Time: 01/07/19 11:47 AM   Specimen: Synovium; Synovial Fluid  Result Value Ref Range Status   Specimen Description   Final    SYNOVIAL LEFT ANKLE Performed at [place] [address], [place], [place] [address]    Special Requests   Final    Immunocompromised Performed at [facility], [place] [address], [place], [place] [address]    Gram Stain   Final    WBC  PRESENT, PREDOMINANTLY PMN NO ORGANISMS SEEN CYTOSPIN Gram Stain Report Called to,Read Back By and Verified With: Jorge Ny RN ON 01/07/19 @ 1310 BY LE Performed at [facility], [place] [address], [place], [place] [address]    Culture   Final    NO GROWTH 2 DAYS Performed at [facility] [address] [place], [place] [address]    Report Status PENDING  Incomplete    Impression/Plan:  1. Fever - has remained afebrile and no new symptoms or concerns.  Can continue to watch off of antibiotics.  I most suspect this has been due to gout and non-infectious.  Discussed with his daughter who is at the bedside.    2. Gout - as above, likely cause of fever with no new symptoms or concerns.  Patient and daughter to discuss with oncology and PCP for treatment options.    I will sign off, call with any questions.

## 2019-01-09 NOTE — Care Management Important Message (Signed)
Important Message  Patient Details IM Letter given to Sharren Bridge SW to present to the Patient Name: David [name]. MRN: [id number] Date of Birth: Nov 25, 1940   Medicare Important Message Given:  Yes     Kerin Salen 01/09/2019, 12:33 PM

## 2019-01-10 LAB — BODY FLUID CULTURE: Culture: NO GROWTH

## 2019-01-10 LAB — GLUCOSE, CAPILLARY: Glucose-Capillary: 156 mg/dL — ABNORMAL HIGH (ref 70–99)

## 2019-01-10 LAB — SURGICAL PATHOLOGY

## 2019-01-10 MED ORDER — GLIPIZIDE 5 MG PO TABS: 5.0000 mg | ORAL_TABLET | Freq: Two times a day (BID) | ORAL | 0 refills | Status: DC

## 2019-01-10 MED ORDER — PREDNISONE 20 MG PO TABS
40.0000 mg | ORAL_TABLET | Freq: Every day | ORAL | Status: DC
  Administered 2019-01-10: 40 mg via ORAL
  Filled 2019-01-10: qty 2

## 2019-01-10 MED ORDER — COLCHICINE 0.6 MG PO TABS: 0.6000 mg | ORAL_TABLET | Freq: Every day | ORAL | 0 refills | Status: DC

## 2019-01-10 MED ORDER — ALLOPURINOL 100 MG PO TABS: 200.0000 mg | ORAL_TABLET | Freq: Every day | ORAL | 1 refills | Status: DC

## 2019-01-10 MED ORDER — PREDNISONE 10 MG PO TABS: ORAL_TABLET | ORAL | 0 refills | Status: AC

## 2019-01-10 NOTE — Progress Notes (Signed)
Patient given discharge instructions, and verbalized an understanding of all discharge instructions.  Patient agrees with discharge plan, and is being discharged in stable medical condition.  Patient given transportation via wheelchair. 

## 2019-01-10 NOTE — Discharge Summary (Signed)
Physician Discharge Summary  David [name]. OIN:[id number] DOB: 1940/10/22 DOA: 01/02/2019  PCP: [name], MD  Admit date: 01/02/2019 Discharge date: 01/10/2019  Admitted From: Home Disposition: Home  Recommendations for Outpatient Follow-up:  1. Follow up with PCP in 1-2 weeks 2. Please obtain CBC/BMP/Mag at follow up 3. Please follow up on the following pending results: Bone marrow biopsy  Home Health: PT/OT/RN/aide Equipment/Devices: 3 in 1 commode  Discharge Condition: Stable CODE STATUS: Full code  Hospital Course: 78 year old male with history of metastatic malignant melanoma on immunotherapy, prostate cancer, DM-2, hypothyroidism, heart block/PPM, CAD/CABG, ICM/ICD presenting with fever and chills and admitted with neutropenic fever initially thought to be due to pneumonia based on initial portable chest x-ray.  Started on broad-spectrum antibiotics. Extensive work-up including CT abdomen and pelvis, and cultures without significant finding.  Medical oncology, ID and palliative care consulted.  After evaluation by ID and arthrocentesis result consistent with gout, antibiotics discontinued and he was started on prednisone and colchicine with significant improvement in his symptoms.  He remained afebrile.   In regards to his pancytopenia, patient had bone marrow biopsy on 01/08/19.  Preliminary on bone biopsy concerning for MDS.  He was a started on Granix and responded well with resolution of his leukopenia.  Patient was evaluated by PT/OT prior to discharge.   See individual problem list below for more on hospital course.  Discharge Diagnoses:  Febrile neutropenia-neutropenia resolved with Granix. -Initially concern about pneumonia based on chest x-ray which has been ruled out. -Extensive work-up including CT abdomen and pelvis, and cultures without significant finding.  -Has bilateral ankle swelling and pain.  Fluid study consistent with gout.  Culture and  Gram stain negative. -Remained stable and afebrile off antibiotics. -Ankle swelling and pain resolved with prednisone and colchicine. -Discharged on a slow prednisone taper, daily colchicine and allopurinol.  Acute gout flareup: patient with bilateral ankle swelling and pain-improving. -Arthrocentesis consistent with gout.  Gram stain and cultures negative. -Left ankle x-ray with some bony irregularities at the tip of the medial malleolus. -Status post intra-articular Depo-Medrol injection by Ortho -Prednisone, colchicine and allopurinol as above.  Pancytopenia/MDS:  -Leukopenia resolved with Granix. -Anemia and thrombocytopenia stable. -Bone marrow biopsy on 9/30 reveals MDS. -Final result on bone marrow biopsy pending.  Metastatic malignant melanoma/history of prostate cancer -Outpatient oncology follow-up.  Left upper extremity swelling: No tenderness or increased warmth to touch.  Venous Doppler negative for DVT.  Improved.  Essential hypertension: Normotensive. -Discharged on home medications.  He is not on diuretics.  History of CAD/CABG/ICM s/p ICD: Stable.  No cardiopulmonary symptoms. -Continue home aspirin -Discontinued home statin while on colchicine.  Doubt benefit at this time.  DM-2 with CKD-3: CBG within fair range. -Increased home glipizide to 5 mg twice daily while on prednisone. -Recommended monitoring CBG at home while on the steroid.  CKD-3/BMD/hyponatremia: Stable. -Avoid nephrotoxic meds  History of complete heart block s/p PPM: Stable  Hypothyroidism -Continue home Synthroid  LUTS: Reports hesitancy.  Has history of prostate cancer.  Has excellent urine output.  AAA: CT scan redemonstrated 3.1 cm infrarenal AAA. -Recommended outpatient follow-up  Goal of care: Patient with multiple comorbidities.  -Met with palliative care while inpatient.  Remains full code  Discharge Instructions  Discharge Instructions    Call MD for:   difficulty breathing, headache or visual disturbances   Complete by: As directed    Call MD for:  persistant dizziness or light-headedness   Complete by: As directed  Call MD for:  persistant nausea and vomiting   Complete by: As directed    Call MD for:  redness, tenderness, or signs of infection (pain, swelling, redness, odor or green/yellow discharge around incision site)   Complete by: As directed    Call MD for:  severe uncontrolled pain   Complete by: As directed    Call MD for:  temperature >100.4   Complete by: As directed    Diet - low sodium heart healthy   Complete by: As directed    Diet Carb Modified   Complete by: As directed    Discharge instructions   Complete by: As directed    It has been a pleasure taking care of you! You were hospitalized with fever, ankle swelling and pain, and diagnosed with gout flareup.  You were treated with prednisone and colchicine.  With that, your symptoms improved.  We are discharging you on more prednisone and colchicine.  We have also added allopurinol to reduce your risk of gout flareup in the future.  Please follow-up with your primary care doctor in 1 to 2 weeks.  Oncology will arrange outpatient follow-up. Please review your new medication list and the directions before you take your medications.  Take care,   Increase activity slowly   Complete by: As directed      Allergies as of 01/10/2019      Reactions   Ezetimibe Other (See Comments)   Unknown reaction   Simvastatin Other (See Comments)   Reaction:  Gave pt a fever  Fever - temp of 103.   In 2003      Medication List    STOP taking these medications   rosuvastatin 20 MG tablet Commonly known as: CRESTOR   Turmeric 500 MG Caps   vitamin E 400 UNIT capsule Commonly known as: vitamin E     TAKE these medications   acetaminophen 325 MG tablet Commonly known as: TYLENOL Take 2 tablets (650 mg total) by mouth every 6 (six) hours as needed for mild pain (or Fever  >/= 101).   allopurinol 100 MG tablet Commonly known as: Zyloprim Take 2 tablets (200 mg total) by mouth daily.   amLODipine 5 MG tablet Commonly known as: NORVASC Take 1 tablet (5 mg total) by mouth daily.   aspirin EC 81 MG tablet Take 81 mg by mouth every evening.   blood glucose meter kit and supplies Dispense based on patient and insurance preference. Use up to four times daily as directed. (FOR ICD-10 E10.9, E11.9).   blood glucose meter kit and supplies Dispense based on patient and insurance preference. Use up to four times daily as directed. (FOR ICD-10 E10.9, E11.9).   blood glucose meter kit and supplies Kit Dispense based on patient and insurance preference. Use up to four times daily as directed. (FOR ICD-9 250.00, 250.01).   citalopram 10 MG tablet Commonly known as: CELEXA Take 1 tablet (10 mg total) by mouth daily.   colchicine 0.6 MG tablet Take 1 tablet (0.6 mg total) by mouth daily.   gabapentin 100 MG capsule Commonly known as: NEURONTIN Take 1 capsule (100 mg total) by mouth 2 (two) times daily.   glipiZIDE 5 MG tablet Commonly known as: GLUCOTROL Take 1 tablet (5 mg total) by mouth 2 (two) times daily before a meal. What changed: how much to take   levothyroxine 137 MCG tablet Commonly known as: SYNTHROID TAKE 1 TABLET BY MOUTH ONCE DAILY BEFORE BREAKFAST What changed:   how  much to take  how to take this  when to take this  additional instructions   methylphenidate 5 MG tablet Commonly known as: RITALIN Take 1 tablet (5 mg total) by mouth 2 (two) times daily with breakfast and lunch.   metoprolol succinate 50 MG 24 hr tablet Commonly known as: TOPROL-XL TAKE 1 AND 1/2 TABLET (75 MG) BY MOUTH EVERY DAY WITH OR IMMEDIATELY FOLLOWING A MEAL *DO NOT CRUSH* What changed:   how much to take  how to take this  when to take this  additional instructions   polyethylene glycol 17 g packet Commonly known as: MIRALAX / GLYCOLAX Take 17  g by mouth 2 (two) times daily. What changed: when to take this   predniSONE 10 MG tablet Commonly known as: DELTASONE Take 4 tablets (40 mg total) by mouth daily with breakfast for 3 days, THEN 3 tablets (30 mg total) daily with breakfast for 3 days, THEN 2 tablets (20 mg total) daily with breakfast for 3 days, THEN 1 tablet (10 mg total) daily with breakfast for 3 days, THEN 0.5 tablets (5 mg total) daily with breakfast for 3 days. Start taking on: January 10, 2019 What changed:   medication strength  See the new instructions.  Another medication with the same name was removed. Continue taking this medication, and follow the directions you see here.   senna 8.6 MG Tabs tablet Commonly known as: SENOKOT Take 1 tablet (8.6 mg total) by mouth at bedtime.   traZODone 50 MG tablet Commonly known as: DESYREL Take 0.5-1 tablets (25-50 mg total) by mouth at bedtime. What changed:   how much to take  when to take this  reasons to take this   Vitamin D3 25 MCG (1000 UT) Caps Take 1 capsule (1,000 Units total) by mouth daily.      Follow-up Information    [name], MD. Schedule an appointment as soon as possible for a visit in 1 week(s).   Specialty: Family Medicine Contact information: [address] [place] [address] [phone]           Consultations:  Infectious disease  Oncology  Palliative care  Orthopedic surgery  Procedures/Studies:  2D Echo: None  Ct Abdomen Pelvis Wo Contrast  Result Date: 01/06/2019 CLINICAL DATA:  Fever, metastatic melanoma EXAM: CT CHEST, ABDOMEN AND PELVIS WITHOUT CONTRAST TECHNIQUE: Multidetector CT imaging of the chest, abdomen and pelvis was performed following the standard protocol without IV contrast. COMPARISON:  PET-CT, 09/18/2018 FINDINGS: CT CHEST FINDINGS Cardiovascular: Aortic atherosclerosis. Normal heart size. Three-vessel coronary artery calcifications and/or stents status post median sternotomy  and CABG. Left chest multi lead pacer. No pericardial effusion. Mediastinum/Nodes: No enlarged mediastinal, hilar, or axillary lymph nodes. Thyroid gland, trachea, and esophagus demonstrate no significant findings. Lungs/Pleura: Small bilateral pleural effusions with associated atelectasis or consolidation. There is a small, stable, calcified benign nodule of the lingula measuring 5 mm (series 6, image 87). Musculoskeletal: No chest wall mass or suspicious bone lesions identified. CT ABDOMEN PELVIS FINDINGS Hepatobiliary: No solid liver abnormality is seen. No gallstones, gallbladder wall thickening, or biliary dilatation. Pancreas: Unremarkable. No pancreatic ductal dilatation or surrounding inflammatory changes. Spleen: Normal in size without significant abnormality. Adrenals/Urinary Tract: Adrenal glands are unremarkable. Multiple low-attenuation lesions of the kidneys, incompletely characterized although likely cysts. Bladder is unremarkable. Stomach/Bowel: Stomach is within normal limits. Appendix appears normal. No evidence of bowel wall thickening, distention, or inflammatory changes. Pancolonic diverticulosis. Moderate burden of stool colon. Vascular/Lymphatic: Tortuous and  severely atherosclerotic aorta, measuring up to 3.1 x 2.9 cm in the infrarenal portion of the vessel. No enlarged abdominal or pelvic lymph nodes. Reproductive: No mass or other abnormality. Other: No abdominal wall hernia or abnormality. No abdominopelvic ascites. Musculoskeletal: Levoscoliosis of the lumbar spine with associated disc degenerative disease. IMPRESSION: 1. Small bilateral pleural effusions with associated atelectasis or consolidation. 2. No definite CT findings of the chest, abdomen, or pelvis to explain fevers. 3. No noncontrast CT evidence of metastatic disease in the chest, abdomen, or pelvis. 4. Redemonstrated infrarenal abdominal aortic aneurysm measuring up to 3.1 cm. Recommend followup by ultrasound in 3 years. This  recommendation follows ACR consensus guidelines: White Paper of the ACR Incidental Findings Committee II on Vascular Findings. J Am Coll Radiol 2013; 10:789-794. Aortic aneurysm NOS (ICD10-I71.9) 5.  Coronary artery disease.  Aortic Atherosclerosis (ICD10-I70.0). Electronically Signed   By: Eddie Candle M.D.   On: 01/06/2019 08:16   Dg Orthopantogram  Result Date: 12/23/2018 CLINICAL DATA:  Acute pulpitis.  Lower right jaw pain EXAM: ORTHOPANTOGRAM/PANORAMIC COMPARISON:  None. FINDINGS: No evidence of periapical abscess or acute bony abnormality. IMPRESSION: No acute findings. Electronically Signed   By: Rolm Baptise M.D.   On: 12/23/2018 09:56   Dg Ankle 2 Views Left  Result Date: 01/06/2019 CLINICAL DATA:  Concern for infection of left ankle, swelling and redness noted to medial aspect of left ankle. H/o metastatic melanoma. H/o gout. EXAM: LEFT ANKLE - 2 VIEW COMPARISON:  Left ankle radiographs 08/27/2015 FINDINGS: There is bony irregularity at the tip of the medial malleolus which may be secondary to the prior fracture seen on radiograph from 08/28/2015. No other focal bony abnormality identified in the ankle. The ankle mortise is intact. The tibiotalar joint space is preserved. Calcaneal spurring. There is vascular calcification in the soft tissues. There is medial soft tissue swelling. IMPRESSION: 1. Bony irregularity at the tip of the medial malleolus likely secondary to the remote fracture seen on radiographs in 08/2015. Difficult to completely exclude superimposed infection. 2. Medial soft tissue swelling. Electronically Signed   By: Audie Pinto M.D.   On: 01/06/2019 19:11   Ct Chest Wo Contrast  Result Date: 01/06/2019 CLINICAL DATA:  Fever, metastatic melanoma EXAM: CT CHEST, ABDOMEN AND PELVIS WITHOUT CONTRAST TECHNIQUE: Multidetector CT imaging of the chest, abdomen and pelvis was performed following the standard protocol without IV contrast. COMPARISON:  PET-CT, 09/18/2018 FINDINGS:  CT CHEST FINDINGS Cardiovascular: Aortic atherosclerosis. Normal heart size. Three-vessel coronary artery calcifications and/or stents status post median sternotomy and CABG. Left chest multi lead pacer. No pericardial effusion. Mediastinum/Nodes: No enlarged mediastinal, hilar, or axillary lymph nodes. Thyroid gland, trachea, and esophagus demonstrate no significant findings. Lungs/Pleura: Small bilateral pleural effusions with associated atelectasis or consolidation. There is a small, stable, calcified benign nodule of the lingula measuring 5 mm (series 6, image 87). Musculoskeletal: No chest wall mass or suspicious bone lesions identified. CT ABDOMEN PELVIS FINDINGS Hepatobiliary: No solid liver abnormality is seen. No gallstones, gallbladder wall thickening, or biliary dilatation. Pancreas: Unremarkable. No pancreatic ductal dilatation or surrounding inflammatory changes. Spleen: Normal in size without significant abnormality. Adrenals/Urinary Tract: Adrenal glands are unremarkable. Multiple low-attenuation lesions of the kidneys, incompletely characterized although likely cysts. Bladder is unremarkable. Stomach/Bowel: Stomach is within normal limits. Appendix appears normal. No evidence of bowel wall thickening, distention, or inflammatory changes. Pancolonic diverticulosis. Moderate burden of stool colon. Vascular/Lymphatic: Tortuous and severely atherosclerotic aorta, measuring up to 3.1 x 2.9 cm in the infrarenal portion of  the vessel. No enlarged abdominal or pelvic lymph nodes. Reproductive: No mass or other abnormality. Other: No abdominal wall hernia or abnormality. No abdominopelvic ascites. Musculoskeletal: Levoscoliosis of the lumbar spine with associated disc degenerative disease. IMPRESSION: 1. Small bilateral pleural effusions with associated atelectasis or consolidation. 2. No definite CT findings of the chest, abdomen, or pelvis to explain fevers. 3. No noncontrast CT evidence of metastatic  disease in the chest, abdomen, or pelvis. 4. Redemonstrated infrarenal abdominal aortic aneurysm measuring up to 3.1 cm. Recommend followup by ultrasound in 3 years. This recommendation follows ACR consensus guidelines: White Paper of the ACR Incidental Findings Committee II on Vascular Findings. J Am Coll Radiol 2013; 10:789-794. Aortic aneurysm NOS (ICD10-I71.9) 5.  Coronary artery disease.  Aortic Atherosclerosis (ICD10-I70.0). Electronically Signed   By: Eddie Candle M.D.   On: 01/06/2019 08:16   Nm Bone Scan Whole Body  Result Date: 12/11/2018 CLINICAL DATA:  History of prostate cancer. EXAM: NUCLEAR MEDICINE WHOLE BODY BONE SCAN TECHNIQUE: Whole body anterior and posterior images were obtained approximately 3 hours after intravenous injection of radiopharmaceutical. RADIOPHARMACEUTICALS:  21.7 mCi Technetium-70mMDP IV COMPARISON:  PET scan of September 18, 2018. Radiographs of November 26, 2017. FINDINGS: Minimal abnormal uptake is seen involving both feet consistent with degenerative change. Abnormal uptake is seen involving the right glenohumeral joint suggesting degenerative change. Degenerative changes seen involving the left knee. No other areas of abnormal uptake are noted. IMPRESSION: No definite scintigraphic evidence of osseous metastases. Electronically Signed   By: JMarijo ConceptionM.D.   On: 12/11/2018 15:19   [place]  Result Date: 01/07/2019 CLINICAL DATA:  Acute kidney injury.  Urinary retention. EXAM: RENAL / URINARY TRACT ULTRASOUND COMPLETE COMPARISON:  CT chest, abdomen and pelvis 01/05/2019. FINDINGS: Right Kidney: Renal measurements: 10.1 x 5.4 x 4.7 cm = volume: 135.1 mL . Echogenicity within normal limits. No solid mass or hydronephrosis visualized. 2.8 cm simple cyst noted. Left Kidney: Renal measurements: 12.4 x 5.5 x 5.0 cm = volume: 178.4 mL. Echogenicity within normal limits. No solid mass or hydronephrosis visualized. Simple 4.1 cm cyst noted. Bladder: Appears normal for degree of  bladder distention. IMPRESSION: Negative for hydronephrosis.  No acute abnormality. Single simple cysts are seen in each kidney. Electronically Signed   By: TInge RiseM.D.   On: 01/07/2019 14:26   [place]  Result Date: 12/11/2018 CLINICAL DATA:  Acute renal insufficiency. EXAM: RENAL / URINARY TRACT ULTRASOUND COMPLETE COMPARISON:  CT of the abdomen December 09, 2018 FINDINGS: Right Kidney: Renal measurements: 11.1 x 6.2 x 5.7 cm = volume: 205 mL. Increased cortical echogenicity and mild cortical thinning. Complex cystic structure in the midpole region of the right kidney measures 2.7 x 2 cm. Additional 1 cm benign-appearing cyst in the upper pole of the right kidney. Left Kidney: Renal measurements: 13 x 5.6 x 6.3 cm = volume: 240 mL. Increased cortical echogenicity and mild cortical thinning. Simple appearing cyst in the midpole region of the left kidney measures 1.9 cm. Additional complex cystic structure in the lower pole of the left kidney measures 1.3 cm Bladder: Appears normal for degree of bladder distention. IMPRESSION: Increased cortical echogenicity and mild cortical thinning bilaterally. Bilateral complex and simple appearing renal cysts. Indention on future follow-up. Normal appearance of the urinary bladder. Electronically Signed   By: DFidela SalisburyM.D.   On: 12/11/2018 20:29   Dg Chest Port 1 View  Result Date: 01/08/2019 CLINICAL DATA:  Shortness of breath EXAM: PORTABLE CHEST  1 VIEW COMPARISON:  01/05/2019 FINDINGS: No significant change in AP portable examination. The lungs are normally aerated. Cardiomegaly status post median sternotomy CABG with left chest multi lead pacer. IMPRESSION: No acute abnormality of the lungs in AP portable projection. Electronically Signed   By: Eddie Candle M.D.   On: 01/08/2019 08:19   Dg Chest Port 1 View  Result Date: 01/05/2019 CLINICAL DATA:  Shortness of breath EXAM: PORTABLE CHEST 1 VIEW COMPARISON:  01/02/2019 FINDINGS: 0359 hours.  Asymmetric elevation right hemidiaphragm. The lungs are clear without focal pneumonia, edema, pneumothorax or pleural effusion. The cardiopericardial silhouette is within normal limits for size. Left permanent pacemaker noted. The visualized bony structures of the thorax are intact. Telemetry leads overlie the chest. IMPRESSION: No acute cardiopulmonary findings. Electronically Signed   By: Misty Stanley M.D.   On: 01/05/2019 08:39   Dg Chest Port 1 View  Result Date: 01/02/2019 CLINICAL DATA:  Fever. EXAM: PORTABLE CHEST 1 VIEW COMPARISON:  None. FINDINGS: Low lung volumes limit assessment. Post median sternotomy and CABG. Left-sided pacemaker in place. Unchanged cardiomegaly. Bronchovascular crowding versus vascular congestion. Stable right hilar prominence from prior exam. Patchy infrahilar opacities, left greater than right, nonspecific cough low volume chest. No large pleural effusion. No pneumothorax. IMPRESSION: 1. Low lung volumes with bronchovascular crowding versus vascular congestion. 2. Patchy infrahilar opacities, left greater than right. Limited assessment on low volume exam, favor atelectasis however pneumonia not excluded in the setting of fever. Recommend PA and lateral views on patient is able. 3. Post CABG with mild cardiomegaly, likely accentuated by technique. Electronically Signed   By: Keith Rake M.D.   On: 01/02/2019 22:39   Dg Chest Port 1 View  Result Date: 12/16/2018 CLINICAL DATA:  Brought in from home by EMS for weakness. Pt was discharged several days ago for sepsis d/t cellulitis in right foot. Daughter states he has gotten increasingly weak and yesterday started voiding large amounts. EXAM: PORTABLE CHEST 1 VIEW COMPARISON:  12/08/2018 FINDINGS: LEFT-sided transvenous pacemaker leads overlie the RIGHT atrium, coronary sinus, and RIGHT ventricle. Median sternotomy and CABG. The heart is normal in size. There are no focal consolidations or pleural effusions. Subsegmental  atelectasis identified at the LEFT lung base. There is no pulmonary edema. IMPRESSION: No active disease. Electronically Signed   By: Nolon Nations M.D.   On: 12/16/2018 15:16   Dg Foot 2 Views Left  Result Date: 12/16/2018 CLINICAL DATA:  Pain and swelling. EXAM: LEFT FOOT - 2 VIEW COMPARISON:  Left ankle radiograph Aug 27, 2015 FINDINGS: There is no evidence of fracture or dislocation. Arthritic changes at the first metatarsophalangeal joint with joint space narrowing, subchondral sclerosis, erosive changes and cyst formation. Milder arthritic changes with subchondral erosions of the first interphalangeal joint. Soft tissues are unremarkable. IMPRESSION: 1. No acute fracture or dislocation identified about the left foot. 2. Arthropathy at the first metatarsophalangeal and interphalangeal joints may represent inflammatory or osteoarthritis. Electronically Signed   By: Fidela Salisbury M.D.   On: 12/16/2018 18:29   Vas [place] Lower Extremity Venous (dvt)  Result Date: 01/07/2019  Lower Venous Study Indications: Swelling.  Limitations: Poor ultrasound/tissue interface. Comparison Study: No prior study. Performing Technologist: Maudry Mayhew MHA, RDMS, RVT, RDCS  Examination Guidelines: A complete evaluation includes B-mode imaging, spectral Doppler, color Doppler, and power Doppler as needed of all accessible portions of each vessel. Bilateral testing is considered an integral part of a complete examination. Limited examinations for reoccurring indications may be performed as noted.  +---------+---------------+---------+-----------+----------+--------------+  RIGHT     Compressibility Phasicity Spontaneity Properties Thrombus Aging  +---------+---------------+---------+-----------+----------+--------------+  CFV       Full            Yes       Yes                                    +---------+---------------+---------+-----------+----------+--------------+  SFJ       Full                                                              +---------+---------------+---------+-----------+----------+--------------+  FV Prox   Full                                                             +---------+---------------+---------+-----------+----------+--------------+  FV Mid    Full                                                             +---------+---------------+---------+-----------+----------+--------------+  FV Distal Full                                                             +---------+---------------+---------+-----------+----------+--------------+  PFV       Full                                                             +---------+---------------+---------+-----------+----------+--------------+  POP       Full            Yes       Yes                                    +---------+---------------+---------+-----------+----------+--------------+  PTV       Full                                                             +---------+---------------+---------+-----------+----------+--------------+  PERO      Full                                                             +---------+---------------+---------+-----------+----------+--------------+   +---------+---------------+---------+-----------+----------+--------------+  LEFT      Compressibility Phasicity Spontaneity Properties Thrombus Aging  +---------+---------------+---------+-----------+----------+--------------+  CFV       Full            Yes       Yes                                    +---------+---------------+---------+-----------+----------+--------------+  SFJ       Full                                                             +---------+---------------+---------+-----------+----------+--------------+  FV Prox   Full                                                             +---------+---------------+---------+-----------+----------+--------------+  FV Mid    Full                                                              +---------+---------------+---------+-----------+----------+--------------+  FV Distal Full                                                             +---------+---------------+---------+-----------+----------+--------------+  PFV       Full                                                             +---------+---------------+---------+-----------+----------+--------------+  POP       Full            Yes       Yes                                    +---------+---------------+---------+-----------+----------+--------------+  PTV       Full                                                             +---------+---------------+---------+-----------+----------+--------------+  PERO      Full                                                             +---------+---------------+---------+-----------+----------+--------------+  Summary: Right: There is no evidence of deep vein thrombosis in the lower extremity. No cystic structure found in the popliteal fossa. Left: There is no evidence of deep vein thrombosis in the lower extremity. No cystic structure found in the popliteal fossa.  *See table(s) above for measurements and observations. Electronically signed by Deitra Mayo MD on 01/07/2019 at 4:31:21 PM.    Final    Vas [place] Upper Extremity Venous Duplex  Result Date: 01/09/2019 UPPER VENOUS STUDY  Indications: Swelling Risk Factors: None identified. Comparison Study: No prior studies. Performing Technologist: Oliver Hum RVT  Examination Guidelines: A complete evaluation includes B-mode imaging, spectral Doppler, color Doppler, and power Doppler as needed of all accessible portions of each vessel. Bilateral testing is considered an integral part of a complete examination. Limited examinations for reoccurring indications may be performed as noted.  Right Findings: +----------+------------+---------+-----------+----------+-------+  RIGHT      Compressible Phasicity Spontaneous Properties Summary   +----------+------------+---------+-----------+----------+-------+  Subclavian     Full        Yes        Yes                         +----------+------------+---------+-----------+----------+-------+  Left Findings: +----------+------------+---------+-----------+----------+-----------------+  LEFT       Compressible Phasicity Spontaneous Properties      Summary       +----------+------------+---------+-----------+----------+-----------------+  IJV            Full        Yes        Yes                                   +----------+------------+---------+-----------+----------+-----------------+  Subclavian     Full        No         No                                    +----------+------------+---------+-----------+----------+-----------------+  Axillary       Full        Yes        Yes                                   +----------+------------+---------+-----------+----------+-----------------+  Brachial       Full        Yes        Yes                                   +----------+------------+---------+-----------+----------+-----------------+  Radial         Full                                                         +----------+------------+---------+-----------+----------+-----------------+  Ulnar          Full                                                         +----------+------------+---------+-----------+----------+-----------------+  Cephalic     Partial                                     Age Indeterminate  +----------+------------+---------+-----------+----------+-----------------+  Basilic        Full                                                         +----------+------------+---------+-----------+----------+-----------------+  Summary:  Right: No evidence of thrombosis in the subclavian.  Left: No evidence of deep vein thrombosis in the upper extremity. Findings consistent with age indeterminate superficial vein thrombosis involving the left cephalic vein.  *See table(s) above for  measurements and observations.  Diagnosing physician: Servando Snare MD Electronically signed by Servando Snare MD on 01/09/2019 at 2:15:38 PM.    Final       Subjective: No major events overnight of this morning.  No complaint this morning.  Ankle pain and swelling resolved.  Denies chest pain, dyspnea, cough, nausea, vomiting, abdominal pain or UTI symptoms.  Feels ready to go home.   Discharge Exam: Vitals:   01/09/19 1947 01/10/19 0442  BP: 111/75 99/68  Pulse: 77 81  Resp: 20 18  Temp: 97.9 F (36.6 C)   SpO2: 93% 94%    GENERAL: No acute distress.  Appears well.  HEENT: MMM.  Vision and hearing grossly intact.  NECK: Supple.  No JVD.  LUNGS:  No IWOB. Good air movement bilaterally. HEART:  RRR. Heart sounds normal.  ABD: Bowel sounds present. Soft. Non tender.  MSK/EXT:  Moves all extremities. No apparent deformity. No edema bilaterally. SKIN: no apparent skin lesion or wound NEURO: Awake, alert and oriented appropriately.  No gross deficit.  PSYCH: Calm. Normal affect.    The results of significant diagnostics from this hospitalization (including imaging, microbiology, ancillary and laboratory) are listed below for reference.     Microbiology: Recent Results (from the past 240 hour(s))  Blood Culture (routine x 2)     Status: None   Collection Time: 01/02/19 10:49 PM   Specimen: BLOOD  Result Value Ref Range Status   Specimen Description   Final    BLOOD RIGHT ANTECUBITAL Performed at [facility] [address] [place], [place] [address]    Special Requests   Final    BOTTLES DRAWN AEROBIC AND ANAEROBIC Blood Culture adequate volume Performed at [facility] [address], [place], [place] [address]    Culture   Final    NO GROWTH 5 DAYS Performed at [facility], [place] [address] [place], [place] [address]    Report Status 01/08/2019 FINAL  Final  Blood Culture (routine x 2)     Status: None   Collection Time:  01/02/19 10:49 PM   Specimen: BLOOD  Result Value Ref Range Status   Specimen Description   Final    BLOOD LEFT ANTECUBITAL Performed at [facility] [address] [place], [place] [address]    Special Requests   Final    BOTTLES DRAWN AEROBIC AND ANAEROBIC Blood Culture adequate volume Performed at [facility] [address], [place], [place] [address]    Culture   Final    NO GROWTH 5 DAYS  Performed at [facility], [place] [address], [place], [place] [address]    Report Status 01/08/2019 FINAL  Final  Urine culture     Status: None   Collection Time: 01/03/19 12:46 AM   Specimen: In/Out Cath Urine  Result Value Ref Range Status   Specimen Description   Final    IN/OUT CATH URINE Performed at [facility] [address], [place], [place] [address]    Special Requests   Final    NONE Performed at [facility], [place] [address] [place], [place] [address]    Culture   Final    NO GROWTH Performed at [facility], [place] [address] [place], [place] [address]    Report Status 01/04/2019 FINAL  Final  SARS Coronavirus 2 [facility] order, Performed in [facility] hospital lab) Nasopharyngeal Nasopharyngeal Swab     Status: None   Collection Time: 01/03/19 12:46 AM   Specimen: Nasopharyngeal Swab  Result Value Ref Range Status   SARS Coronavirus 2 NEGATIVE NEGATIVE Final    Comment: (NOTE) If result is NEGATIVE SARS-CoV-2 target nucleic acids are NOT DETECTED. The SARS-CoV-2 RNA is generally detectable in upper and lower  respiratory specimens during the acute phase of infection. The lowest  concentration of SARS-CoV-2 viral copies this assay can detect is 250  copies / mL. A negative result does not preclude SARS-CoV-2 infection  and should not be used as the sole basis for treatment or other  patient management decisions.  A negative result may occur with  improper specimen collection /  handling, submission of specimen other  than nasopharyngeal swab, presence of viral mutation(s) within the  areas targeted by this assay, and inadequate number of viral copies  (<250 copies / mL). A negative result must be combined with clinical  observations, patient history, and epidemiological information. If result is POSITIVE SARS-CoV-2 target nucleic acids are DETECTED. The SARS-CoV-2 RNA is generally detectable in upper and lower  respiratory specimens dur ing the acute phase of infection.  Positive  results are indicative of active infection with SARS-CoV-2.  Clinical  correlation with patient history and other diagnostic information is  necessary to determine patient infection status.  Positive results do  not rule out bacterial infection or co-infection with other viruses. If result is PRESUMPTIVE POSTIVE SARS-CoV-2 nucleic acids MAY BE PRESENT.   A presumptive positive result was obtained on the submitted specimen  and confirmed on repeat testing.  While 2019 novel coronavirus  (SARS-CoV-2) nucleic acids may be present in the submitted sample  additional confirmatory testing may be necessary for epidemiological  and / or clinical management purposes  to differentiate between  SARS-CoV-2 and other Sarbecovirus currently known to infect humans.  If clinically indicated additional testing with an alternate test  methodology [phone] is advised. The SARS-CoV-2 RNA is generally  detectable in upper and lower respiratory sp ecimens during the acute  phase of infection. The expected result is Negative. Fact Sheet for Patients:  StrictlyIdeas.no Fact Sheet for Healthcare Providers: BankingDealers.co.za This test is not yet approved or cleared by the [place] FDA and has been authorized for detection and/or diagnosis of SARS-CoV-2 by FDA under an Emergency Use Authorization (EUA).  This EUA will remain in effect (meaning this  test can be used) for the duration of the COVID-19 declaration under Section 564(b)(1) of the Act, 21 U.S.C. section 360bbb-3(b)(1), unless the authorization is terminated or revoked sooner. Performed at [facility],  [place] [address], [place], [place] [address]   MRSA PCR Screening     Status: None   Collection Time: 01/04/19  4:59 PM   Specimen: Nasal Mucosa; Nasopharyngeal  Result Value Ref Range Status   MRSA by PCR NEGATIVE NEGATIVE Final    Comment:        The GeneXpert MRSA Assay (FDA approved for NASAL specimens only), is one component of a comprehensive MRSA colonization surveillance program. It is not intended to diagnose MRSA infection nor to guide or monitor treatment for MRSA infections. Performed at [facility], [place] [address], [place], [place] [address]   Fungus culture, blood     Status: None (Preliminary result)   Collection Time: 01/05/19  2:29 PM   Specimen: BLOOD  Result Value Ref Range Status   Specimen Description   Final    BLOOD BLOOD RIGHT HAND Performed at [facility] [address], [place], [place] [address]    Special Requests   Final    BOTTLES DRAWN AEROBIC ONLY Blood Culture adequate volume Performed at [facility] [address] [place], [place] [address]    Culture   Final    NO GROWTH 5 DAYS Performed at [facility], [place] [address] [place], [place] [address]    Report Status PENDING  Incomplete  Body fluid culture     Status: None (Preliminary result)   Collection Time: 01/07/19 11:47 AM   Specimen: Synovium; Synovial Fluid  Result Value Ref Range Status   Specimen Description   Final    SYNOVIAL LEFT ANKLE Performed at [facility] [address] [place], [place] [address]    Special Requests   Final    Immunocompromised Performed at [facility], [place] [address] [place], [place] [address]    Gram Stain    Final    WBC PRESENT, PREDOMINANTLY PMN NO ORGANISMS SEEN CYTOSPIN Gram Stain Report Called to,Read Back By and Verified With: Jorge Ny RN ON 01/07/19 @ 1310 BY LE Performed at [facility], [place] [address] [place], [place] [address]    Culture   Final    NO GROWTH 2 DAYS Performed at [facility] [address] [place], [place] [address]    Report Status PENDING  Incomplete     Labs: BNP (last 3 results) No results for input(s): BNP in the last 8760 hours. Basic Metabolic Panel: Recent Labs  Lab 01/04/19 0539 01/05/19 0521 01/06/19 0542 01/07/19 0518 01/08/19 0626 01/09/19 0533  NA 136 138 134* 133* 131* 131*  K 3.9 4.2 4.2 3.9 4.0 3.9  CL 109 106 103 100 95* 93*  CO2 19* 23 18* _0 GLUCOSE 101* 87 100* 159* 150* 197*  BUN 52* 31* 26* 27* 34* 41*  CREATININE 1.31* 1.17 1.30* 1.52* 1.41* 1.32*  CALCIUM 7.4* 7.9* 7.6* 7.6* 7.3* 7.6*  MG 2.1 1.9 1.7 1.9 2.1 1.9  PHOS 3.1 1.8* 2.7 2.3* 3.4  --    Liver Function Tests: Recent Labs  Lab 01/04/19 0539 01/05/19 0521 01/06/19 0542 01/07/19 0518 01/08/19 0626  AST 15 16 13* 14* 15  ALT _1 ALKPHOS 29* 35* 34* 34* 33*  BILITOT 0.7 0.6 0.8 0.8 0.8  PROT 4.7* 5.3* 5.2* 5.3* 5.2*  ALBUMIN 2.5* 2.6* 2.4* 2.4* 2.2*   No results for input(s): LIPASE, AMYLASE in the last 168 hours. No results for input(s): AMMONIA  in the last 168 hours. CBC: Recent Labs  Lab 01/04/19 0539 01/05/19 0521 01/06/19 0542 01/07/19 0518 01/08/19 0626 01/09/19 0809  WBC 0.7* 2.9* 2.7* 2.2* 0.9* 6.7  NEUTROABS 0.4* 2.5 2.0 1.1* 0.4*  --   HGB 8.2* 9.6* 9.5* 9.5* 8.4* 8.4*  HCT 24.6* 28.9* 29.1* 28.6* 25.8* 25.4*  MCV 98.4 98.6 100.3* 99.0 99.6 97.3  PLT 121* 132* 139* 125* 101* 102*   Cardiac Enzymes: No results for input(s): CKTOTAL, CKMB, CKMBINDEX, TROPONINI in the last 168 hours. BNP: Invalid input(s): POCBNP CBG: Recent Labs  Lab 01/09/19 0745 01/09/19 1206 01/09/19 1642  01/09/19 1951 01/10/19 0721  GLUCAP 144* 201* 282* 295* 156*   D-Dimer No results for input(s): DDIMER in the last 72 hours. Hgb A1c No results for input(s): HGBA1C in the last 72 hours. Lipid Profile No results for input(s): CHOL, HDL, LDLCALC, TRIG, CHOLHDL, LDLDIRECT in the last 72 hours. Thyroid function studies No results for input(s): TSH, T4TOTAL, T3FREE, THYROIDAB in the last 72 hours.  Invalid input(s): FREET3 Anemia work up No results for input(s): VITAMINB12, FOLATE, FERRITIN, TIBC, IRON, RETICCTPCT in the last 72 hours. Urinalysis    Component Value Date/Time   COLORURINE STRAW (A) 01/03/2019 0046   APPEARANCEUR CLEAR 01/03/2019 0046   LABSPEC 1.008 01/03/2019 0046   PHURINE 5.0 01/03/2019 0046   GLUCOSEU NEGATIVE 01/03/2019 0046   HGBUR MODERATE (A) 01/03/2019 0046   BILIRUBINUR NEGATIVE 01/03/2019 0046   KETONESUR NEGATIVE 01/03/2019 0046   PROTEINUR NEGATIVE 01/03/2019 0046   NITRITE NEGATIVE 01/03/2019 0046   LEUKOCYTESUR NEGATIVE 01/03/2019 0046   Sepsis Labs Invalid input(s): PROCALCITONIN,  WBC,  LACTICIDVEN  Discussed discharge plan with patient's daughter, Dr. [name] over the phone.  Time coordinating discharge: 35 minutes  SIGNED:  Mercy Riding, MD  [facility] 01/10/2019, 9:13 AM  If 7PM-7AM, please contact night-coverage [email or web address] Password [email or web address]

## 2019-01-12 ENCOUNTER — Other Ambulatory Visit: Payer: Self-pay | Admitting: Hematology

## 2019-01-12 LAB — FUNGUS CULTURE, BLOOD
Culture: NO GROWTH
Special Requests: ADEQUATE

## 2019-01-13 ENCOUNTER — Other Ambulatory Visit: Payer: Self-pay | Admitting: Hematology

## 2019-01-13 ENCOUNTER — Encounter: Payer: Self-pay | Admitting: Medical Oncology

## 2019-01-13 ENCOUNTER — Telehealth: Payer: Self-pay

## 2019-01-13 ENCOUNTER — Telehealth: Payer: Self-pay | Admitting: Medical Oncology

## 2019-01-13 NOTE — Telephone Encounter (Signed)
Transition Care Management Follow-up Telephone Call   Date discharged? 01/10/2019.   How have you been since you were released from the hospital? Patient states he is doing ok. Eating well. No urinary issues. No pain anywhere.   Do you understand why you were in the hospital? yes   Do you understand the discharge instructions? yes   Where were you discharged to? Home with his wife, also gets help from family and friends.   Items Reviewed:  Medications reviewed: yes  Allergies reviewed: yes  Dietary changes reviewed: yes  Referrals reviewed: yes   Functional Questionnaire:   Activities of Daily Living (ADLs):   He states they are independent in the following: ambulation-has not been using a walker, fixing food and medication, bathing, dressing, hygiene, toileting. States they require assistance with the following: none at this time   Any transportation issues/concerns?: No   Any patient concerns? Not at this time   Confirmed importance and date/time of follow-up visits scheduled yes  Provider Appointment booked with Dr Darnell Level on 01/16/2019.  Confirmed with patient if condition begins to worsen call PCP or go to the ER.  Patient was given the office number and encouraged to call back with question or concerns.  : yes

## 2019-01-14 ENCOUNTER — Telehealth: Payer: Self-pay | Admitting: Hematology

## 2019-01-14 ENCOUNTER — Telehealth: Payer: Self-pay | Admitting: Family Medicine

## 2019-01-14 ENCOUNTER — Telehealth: Payer: Self-pay

## 2019-01-14 DIAGNOSIS — Z7984 Long term (current) use of oral hypoglycemic drugs: Secondary | ICD-10-CM | POA: Diagnosis not present

## 2019-01-14 DIAGNOSIS — N179 Acute kidney failure, unspecified: Secondary | ICD-10-CM | POA: Diagnosis not present

## 2019-01-14 DIAGNOSIS — M109 Gout, unspecified: Secondary | ICD-10-CM | POA: Diagnosis not present

## 2019-01-14 DIAGNOSIS — E1165 Type 2 diabetes mellitus with hyperglycemia: Secondary | ICD-10-CM | POA: Diagnosis not present

## 2019-01-14 DIAGNOSIS — R5381 Other malaise: Secondary | ICD-10-CM | POA: Diagnosis not present

## 2019-01-14 DIAGNOSIS — Z7982 Long term (current) use of aspirin: Secondary | ICD-10-CM | POA: Diagnosis not present

## 2019-01-14 MED ORDER — NOVOLOG FLEXPEN 100 UNIT/ML ~~LOC~~ SOPN: PEN_INJECTOR | SUBCUTANEOUS | 1 refills | Status: DC

## 2019-01-14 NOTE — Telephone Encounter (Signed)
Note from daughter: My dad is back home from the hospital (again). He is doing much better. He is on a slow steroid taper for gout and his evening CBGs are high. Last night it was 280s-300s for several hours. He ate chicken soup with vegetales and a few saltines. He took 5 mg glipizide. I was wondering if we could get a prescription for Novalog for a sliding scale. He is always high in the evenings. I have skilled workers at the house all week and next week. We can monitor closely and administer correction doses as needed. I would not allow him to do this alone yet. He's better, but not quite ready for that amount of responsibility.    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 He goes to prostate tumor board Friday.

## 2019-01-14 NOTE — Telephone Encounter (Signed)
Spoke with patient to discuss referral to [facility]. He had been scheduled last month but was rescheduled due to hospital admissions. He states he is home and is slowly improving. He is scheduled for [facility] 10/9 at 8:30 am. He would like to do his visit via telephone. I will call him back on Thursday to confirm date and time. He voiced understanding.

## 2019-01-14 NOTE — Telephone Encounter (Signed)
Scheduled appt per 10/5 sch message - pt unable to come in this week - scheduled next available for pt on 10/12 - pt aware of appt date and time and msg sent to MD so she is aware of appt not being this week

## 2019-01-14 NOTE — Progress Notes (Signed)
Spoke with daughter- Dr. [name] to confirm [facility] appointment 10/9 @ 8:30 am. She would like to join the appointment with her dad on a 3 way call via [place].

## 2019-01-14 NOTE — Telephone Encounter (Signed)
Will send in novalog pen for sliding scale PRN elevated sugar readings.  rec hold glipizide if we start sliding scale while we see how he tolerates this.  Hopeful for less need as he tapers off prednisone. If persistent need, will start basal insulin vs DPP4.

## 2019-01-14 NOTE — Telephone Encounter (Signed)
[facility] OT with [facility] left v/m requesting verbal orders for [facility] OT 2 x a wk for 2 wks.

## 2019-01-14 NOTE — Telephone Encounter (Signed)
Agree with this. Thanks.  

## 2019-01-15 ENCOUNTER — Telehealth: Payer: Self-pay | Admitting: Family Medicine

## 2019-01-15 DIAGNOSIS — N179 Acute kidney failure, unspecified: Secondary | ICD-10-CM | POA: Diagnosis not present

## 2019-01-15 DIAGNOSIS — R5381 Other malaise: Secondary | ICD-10-CM | POA: Diagnosis not present

## 2019-01-15 DIAGNOSIS — E1165 Type 2 diabetes mellitus with hyperglycemia: Secondary | ICD-10-CM | POA: Diagnosis not present

## 2019-01-15 DIAGNOSIS — Z7984 Long term (current) use of oral hypoglycemic drugs: Secondary | ICD-10-CM | POA: Diagnosis not present

## 2019-01-15 DIAGNOSIS — Z7982 Long term (current) use of aspirin: Secondary | ICD-10-CM | POA: Diagnosis not present

## 2019-01-15 DIAGNOSIS — M109 Gout, unspecified: Secondary | ICD-10-CM | POA: Diagnosis not present

## 2019-01-15 MED ORDER — ONETOUCH ULTRASOFT LANCETS MISC: 12 refills | Status: DC

## 2019-01-15 MED ORDER — GLUCOSE BLOOD VI STRP: ORAL_STRIP | 12 refills | Status: DC

## 2019-01-15 MED ORDER — ONETOUCH VERIO W/DEVICE KIT: 1.0000 | PACK | 0 refills | Status: DC

## 2019-01-15 NOTE — Telephone Encounter (Signed)
Agree with this. Thak[place].

## 2019-01-15 NOTE — Telephone Encounter (Signed)
Ok to make this switch - thanks.

## 2019-01-15 NOTE — Addendum Note (Signed)
Addended by: [name] on: 01/15/2019 06:09 PM   Modules accepted: Orders

## 2019-01-15 NOTE — Telephone Encounter (Signed)
[place] said prescription Novolog is not covered by insurance Kathleen Lime is covered by insurance. Will you please change this?  Pharmacy CB [phone]

## 2019-01-15 NOTE — Telephone Encounter (Signed)
Spoke with Sherlynn Stalls informing her Dr. Darnell Level is giving verbal orders for services requested.

## 2019-01-15 NOTE — Addendum Note (Signed)
Addended by: [name] on: 01/15/2019 06:06 PM   Modules accepted: Orders

## 2019-01-15 NOTE — Telephone Encounter (Signed)
Will also send glucometer, lancets and strips per daughter request.

## 2019-01-15 NOTE — Telephone Encounter (Signed)
Spoke with Jeani Hawking at [facility] relaying Dr. Synthia Innocent message.  Says she will make the switch and get prepared for pt.

## 2019-01-15 NOTE — Telephone Encounter (Signed)
David [name] with [facility] l/m and states insulin aspart (NOVOLOG FLEXPEN) 100 UNIT/ML FlexPen is not covered by insurance but Humalog would be covered and if this is appropriate she just needs a call back to get an ok for them to switch this. CB [phone].

## 2019-01-15 NOTE — Telephone Encounter (Signed)
[facility] [phone]  Verbal Approval for physical therapy 2x a week for 3 weeks 1x week for 2 weeks

## 2019-01-16 ENCOUNTER — Ambulatory Visit (INDEPENDENT_AMBULATORY_CARE_PROVIDER_SITE_OTHER): Payer: Medicare Other | Admitting: Family Medicine

## 2019-01-16 ENCOUNTER — Telehealth: Payer: Self-pay | Admitting: Medical Oncology

## 2019-01-16 ENCOUNTER — Other Ambulatory Visit: Payer: Self-pay

## 2019-01-16 ENCOUNTER — Encounter (HOSPITAL_COMMUNITY): Payer: Self-pay | Admitting: Hematology

## 2019-01-16 ENCOUNTER — Encounter: Payer: Self-pay | Admitting: Family Medicine

## 2019-01-16 VITALS — BP 118/80 | HR 79 | Temp 97.8°F | Ht 69.5 in | Wt 200.2 lb

## 2019-01-16 DIAGNOSIS — D469 Myelodysplastic syndrome, unspecified: Secondary | ICD-10-CM | POA: Diagnosis not present

## 2019-01-16 DIAGNOSIS — E1165 Type 2 diabetes mellitus with hyperglycemia: Secondary | ICD-10-CM

## 2019-01-16 DIAGNOSIS — D61818 Other pancytopenia: Secondary | ICD-10-CM

## 2019-01-16 DIAGNOSIS — R5381 Other malaise: Secondary | ICD-10-CM | POA: Diagnosis not present

## 2019-01-16 DIAGNOSIS — M109 Gout, unspecified: Secondary | ICD-10-CM | POA: Diagnosis not present

## 2019-01-16 DIAGNOSIS — Z7189 Other specified counseling: Secondary | ICD-10-CM | POA: Diagnosis not present

## 2019-01-16 DIAGNOSIS — C7931 Secondary malignant neoplasm of brain: Secondary | ICD-10-CM | POA: Diagnosis not present

## 2019-01-16 DIAGNOSIS — T380X5A Adverse effect of glucocorticoids and synthetic analogues, initial encounter: Secondary | ICD-10-CM

## 2019-01-16 DIAGNOSIS — M1039 Gout due to renal impairment, multiple sites: Secondary | ICD-10-CM | POA: Diagnosis not present

## 2019-01-16 DIAGNOSIS — C61 Malignant neoplasm of prostate: Secondary | ICD-10-CM

## 2019-01-16 DIAGNOSIS — Z7982 Long term (current) use of aspirin: Secondary | ICD-10-CM | POA: Diagnosis not present

## 2019-01-16 DIAGNOSIS — N179 Acute kidney failure, unspecified: Secondary | ICD-10-CM | POA: Diagnosis not present

## 2019-01-16 DIAGNOSIS — C439 Malignant melanoma of skin, unspecified: Secondary | ICD-10-CM | POA: Diagnosis not present

## 2019-01-16 DIAGNOSIS — E559 Vitamin D deficiency, unspecified: Secondary | ICD-10-CM | POA: Diagnosis not present

## 2019-01-16 DIAGNOSIS — N183 Chronic kidney disease, stage 3 unspecified: Secondary | ICD-10-CM

## 2019-01-16 DIAGNOSIS — R739 Hyperglycemia, unspecified: Secondary | ICD-10-CM

## 2019-01-16 DIAGNOSIS — R531 Weakness: Secondary | ICD-10-CM

## 2019-01-16 DIAGNOSIS — E039 Hypothyroidism, unspecified: Secondary | ICD-10-CM

## 2019-01-16 DIAGNOSIS — Z7984 Long term (current) use of oral hypoglycemic drugs: Secondary | ICD-10-CM | POA: Diagnosis not present

## 2019-01-16 DIAGNOSIS — E785 Hyperlipidemia, unspecified: Secondary | ICD-10-CM | POA: Diagnosis not present

## 2019-01-16 LAB — COMPREHENSIVE METABOLIC PANEL
ALT: 28 U/L (ref 0–53)
AST: 14 U/L (ref 0–37)
Albumin: 3.5 g/dL (ref 3.5–5.2)
Alkaline Phosphatase: 62 U/L (ref 39–117)
BUN: 75 mg/dL — ABNORMAL HIGH (ref 6–23)
CO2: 24 mEq/L (ref 19–32)
Calcium: 9.3 mg/dL (ref 8.4–10.5)
Chloride: 99 mEq/L (ref 96–112)
Creatinine, Ser: 1.37 mg/dL (ref 0.40–1.50)
GFR: 50.05 mL/min — ABNORMAL LOW (ref >60.00)
Glucose, Bld: 122 mg/dL — ABNORMAL HIGH (ref 70–99)
Potassium: 4.7 mEq/L (ref 3.5–5.1)
Sodium: 132 mEq/L — ABNORMAL LOW (ref 135–145)
Total Bilirubin: 0.3 mg/dL (ref 0.2–1.2)
Total Protein: 5.7 g/dL — ABNORMAL LOW (ref 6.0–8.3)

## 2019-01-16 LAB — CBC WITH DIFFERENTIAL/PLATELET
Basophils Absolute: 0 10*3/uL (ref 0.0–0.1)
Basophils Relative: 0.1 % (ref 0.0–3.0)
Eosinophils Absolute: 0 10*3/uL (ref 0.0–0.7)
Eosinophils Relative: 0.6 % (ref 0.0–5.0)
HCT: 26.9 % — ABNORMAL LOW (ref 39.0–52.0)
Hemoglobin: 9.3 g/dL — ABNORMAL LOW (ref 13.0–17.0)
Lymphocytes Relative: 10.6 % — ABNORMAL LOW (ref 12.0–46.0)
Lymphs Abs: 0.1 10*3/uL — ABNORMAL LOW (ref 0.7–4.0)
MCHC: 34.6 g/dL (ref 30.0–36.0)
MCV: 96.8 fl (ref 78.0–100.0)
Monocytes Absolute: 0.1 10*3/uL (ref 0.1–1.0)
Monocytes Relative: 6.1 % (ref 3.0–12.0)
Neutro Abs: 0.9 10*3/uL — ABNORMAL LOW (ref 1.4–7.7)
Neutrophils Relative %: 82.6 % — ABNORMAL HIGH (ref 43.0–77.0)
Platelets: 153 10*3/uL (ref 150.0–400.0)
RBC: 2.78 Mil/uL — ABNORMAL LOW (ref 4.22–5.81)
RDW: 16.3 % — ABNORMAL HIGH (ref 11.5–15.5)
WBC: 1.1 10*3/uL — CL (ref 4.0–10.5)

## 2019-01-16 LAB — TSH: TSH: 2.17 u[IU]/mL (ref 0.35–4.50)

## 2019-01-16 LAB — VITAMIN D 25 HYDROXY (VIT D DEFICIENCY, FRACTURES): VITD: 26.18 ng/mL — ABNORMAL LOW (ref 30.00–100.00)

## 2019-01-16 LAB — ANAEROBIC CULTURE

## 2019-01-16 MED ORDER — GABAPENTIN 100 MG PO CAPS: 100.0000 mg | ORAL_CAPSULE | Freq: Two times a day (BID) | ORAL | 0 refills | Status: DC

## 2019-01-16 NOTE — Patient Instructions (Addendum)
Stop gabapentin.  Continue other medicines  Labs today.  Return in 3-4 weeks for follow up visit.

## 2019-01-16 NOTE — Telephone Encounter (Signed)
Spoke with patient to confirm appointment for [facility] 10/9 at 8:30 am. We will hold consult by [facility]. I spoke with his daughter, [name] and she will join [place].

## 2019-01-16 NOTE — Telephone Encounter (Signed)
Spoke with Erline Levine informing her Dr. Darnell Level is giving verbal orders for services requested.

## 2019-01-16 NOTE — Progress Notes (Addendum)
This visit was conducted in person.  BP 118/80 (BP Location: Right Arm, Patient Position: Sitting, Cuff Size: Normal)   Pulse 79   Temp 97.8 F (36.6 C) (Temporal)   Ht 5' 9.5" (1.765 m)   Wt 200 lb 3 oz (90.8 kg)   SpO2 95%   BMI 29.14 kg/m    CC: TCM hosp f/u visit Subjective:    Patient ID: [name], male    DOB: 12-16-1940, 78 y.o.   MRN: [id number]  HPI: Adem Costlow. is a 78 y.o. male presenting on 01/16/2019 for Hospitalization Follow-up (Pt accompanied by nurse aid, Monia Pouch. )   Recent complicated course.  Hospitalized twice last month with neutropenic fever, sepsis and AMS thought UTI vs R foot cellulitis related then lethargy with hyperglycemia. See prior note for details of that hospitalization.   Hospitalized again this month with neutropenic fever without cause found (after CTs, cultures, etc). Treated initially with broad spectrum empiric antibiotics. Had arthrocentesis of ankle showing gout flare. Treated with prednisone and colchicine with improvement. Ankle culture negative. Extended on prolonged slow prednisone taper, started on allopurinol 210m daily.   For pancytopenia - bone marrow biopsy showed myelodysplastic syndrome. Treating with granix with benefit of leukopenia. To get neupogen tomorrow as well as meet with prostate tumor board.   Prostate cancer tumor board appt scheduled for tomorrow morning for newly found prostate cancer.   Newly found DM presumed due to oral prednisone use - we started humalog sliding scale at home as glipizide 536mbid was not controlling sugars - anticipate improvement once he finishes prednisone taper.  Sugars running high despite glipizide 17m54mid.  Currently on prednisone 15m44mily.  Postprandial breakfast 80-110, postprandial dinner 250-300s.   Advanced directives: met with palliative care team at hospital. Reviewed with patient and daughter - ok with non invasive therapies for MDS but does not want  aggressive treatments like chemo.   Now off crestor. Feels significant improvement off crestor.  Continues ritalin 17mg 46m regularly.   Admit date: 01/02/2019 Discharge date: 01/10/2019 TCM hosp f/u phone call completed 01/13/2019  Admitted From: Home Disposition: Home  Recommendations for Outpatient Follow-up:  1. Follow up with PCP in 1-2 weeks 2. Please obtain CBC/BMP/Mag at follow up 3. Please follow up on the following pending results: Bone marrow biopsy  Home Health: PT/OT/RN/aide Equipment/Devices: 3 in 1 commode  Discharge Condition: Stable CODE STATUS: Full code     Relevant past medical, surgical, family and social history reviewed and updated as indicated. Interim medical history since our last visit reviewed. Allergies and medications reviewed and updated. Outpatient Medications Prior to Visit  Medication Sig Dispense Refill  . acetaminophen (TYLENOL) 325 MG tablet Take 2 tablets (650 mg total) by mouth every 6 (six) hours as needed for mild pain (or Fever >/= 101).    . allMarland Kitchenpurinol (ZYLOPRIM) 100 MG tablet Take 2 tablets (200 mg total) by mouth daily. 180 tablet 1  . amLODipine (NORVASC) 5 MG tablet Take 1 tablet (5 mg total) by mouth daily. 32 tablet 11  . aspirin EC 81 MG tablet Take 81 mg by mouth every evening.     . Blood Glucose Monitoring Suppl (ONETOUCH VERIO) w/Device KIT 1 kit by Does not apply route as directed. E11.65 1 kit 0  . Cholecalciferol (VITAMIN D3) 25 MCG (1000 UT) CAPS Take 1 capsule (1,000 Units total) by mouth daily. 30 capsule 0  . citalopram (CELEXA) 10 MG tablet Take 1 tablet (  10 mg total) by mouth daily. 90 tablet 1  . colchicine 0.6 MG tablet Take 1 tablet (0.6 mg total) by mouth daily. 30 tablet 0  . glipiZIDE (GLUCOTROL) 5 MG tablet Take 1 tablet (5 mg total) by mouth 2 (two) times daily before a meal. 60 tablet 0  . glucose blood test strip E11.65 Use as instructed to check sugars three time daily and as needed 100 each 12  . insulin  aspart (NOVOLOG FLEXPEN) 100 UNIT/ML FlexPen Use three times daily with meals as needed per sliding scale: 2 units for every 50 units over 150 (2u for 150-199, 4u for 200-249, 6u for 250-299, etc) 3 mL 1  . Lancets (ONETOUCH ULTRASOFT) lancets E11.65 Use as instructed to check sugars three time daily and as needed 100 each 12  . levothyroxine (SYNTHROID) 137 MCG tablet TAKE 1 TABLET BY MOUTH ONCE DAILY BEFORE BREAKFAST (Patient taking differently: Take 137 mcg by mouth daily before breakfast. ) 30 tablet 11  . methylphenidate (RITALIN) 5 MG tablet Take 1 tablet (5 mg total) by mouth 2 (two) times daily with breakfast and lunch. 60 tablet 0  . metoprolol succinate (TOPROL-XL) 50 MG 24 hr tablet TAKE 1 AND 1/2 TABLET (75 MG) BY MOUTH EVERY DAY WITH OR IMMEDIATELY FOLLOWING A MEAL *DO NOT CRUSH* (Patient taking differently: Take 75 mg by mouth daily. ) 50 tablet 11  . polyethylene glycol (MIRALAX / GLYCOLAX) 17 g packet Take 17 g by mouth 2 (two) times daily. (Patient taking differently: Take 17 g by mouth daily. ) 14 each 0  . predniSONE (DELTASONE) 10 MG tablet Take 4 tablets (40 mg total) by mouth daily with breakfast for 3 days, THEN 3 tablets (30 mg total) daily with breakfast for 3 days, THEN 2 tablets (20 mg total) daily with breakfast for 3 days, THEN 1 tablet (10 mg total) daily with breakfast for 3 days, THEN 0.5 tablets (5 mg total) daily with breakfast for 3 days. 32 tablet 0  . senna (SENOKOT) 8.6 MG TABS tablet Take 1 tablet (8.6 mg total) by mouth at bedtime. 120 tablet 0  . traZODone (DESYREL) 50 MG tablet Take 0.5-1 tablets (25-50 mg total) by mouth at bedtime. (Patient taking differently: Take 25 mg by mouth at bedtime as needed for sleep. ) 30 tablet 1  . gabapentin (NEURONTIN) 100 MG capsule Take 1 capsule (100 mg total) by mouth 2 (two) times daily. 60 capsule 0   Facility-Administered Medications Prior to Visit  Medication Dose Route Frequency Provider Last Rate Last Dose  . 0.9 %   sodium chloride infusion  1,000 mL Intravenous Once [name], MD         Per HPI unless specifically indicated in ROS section below Review of Systems Objective:    BP 118/80 (BP Location: Right Arm, Patient Position: Sitting, Cuff Size: Normal)   Pulse 79   Temp 97.8 F (36.6 C) (Temporal)   Ht 5' 9.5" (1.765 m)   Wt 200 lb 3 oz (90.8 kg)   SpO2 95%   BMI 29.14 kg/m   Wt Readings from Last 3 Encounters:  01/16/19 200 lb 3 oz (90.8 kg)  01/03/19 199 lb 15.3 oz (90.7 kg)  12/31/18 200 lb (90.7 kg)    Physical Exam Vitals signs and nursing note reviewed.  Constitutional:      General: He is not in acute distress.    Appearance: Normal appearance. He is not ill-appearing.     Comments: Walks unassisted  HENT:     Mouth/Throat:     Mouth: Mucous membranes are moist.     Pharynx: Oropharynx is clear. No posterior oropharyngeal erythema.  Eyes:     Extraocular Movements: Extraocular movements intact.     Pupils: Pupils are equal, round, and reactive to light.  Cardiovascular:     Rate and Rhythm: Normal rate and regular rhythm.     Pulses: Normal pulses.     Heart sounds: Normal heart sounds. No murmur.  Pulmonary:     Effort: Pulmonary effort is normal. No respiratory distress.     Breath sounds: Normal breath sounds. No wheezing, rhonchi or rales.  Musculoskeletal:     Right lower leg: No edema.     Left lower leg: No edema.  Skin:    General: Skin is warm and dry.     Findings: No rash.  Neurological:     General: No focal deficit present.     Mental Status: He is alert.  Psychiatric:        Mood and Affect: Mood normal.        Behavior: Behavior normal.       Results for orders placed or performed in visit on 01/16/19  Comprehensive metabolic panel  Result Value Ref Range   Sodium 132 (L) 135 - 145 mEq/L   Potassium 4.7 3.5 - 5.1 mEq/L   Chloride 99 96 - 112 mEq/L   CO2 24 19 - 32 mEq/L   Glucose, Bld 122 (H) 70 - 99 mg/dL   BUN 75 (H) 6 - 23 mg/dL    Creatinine, Ser 1.37 0.40 - 1.50 mg/dL   Total Bilirubin 0.3 0.2 - 1.2 mg/dL   Alkaline Phosphatase 62 39 - 117 U/L   AST 14 0 - 37 U/L   ALT 28 0 - 53 U/L   Total Protein 5.7 (L) 6.0 - 8.3 g/dL   Albumin 3.5 3.5 - 5.2 g/dL   Calcium 9.3 8.4 - 10.5 mg/dL   GFR 50.05 (L) >60.00 mL/min  TSH  Result Value Ref Range   TSH 2.17 0.35 - 4.50 uIU/mL  CBC with Differential/Platelet  Result Value Ref Range   WBC 1.1 Repeated and verified X2. (LL) 4.0 - 10.5 K/uL   RBC 2.78 (L) 4.22 - 5.81 Mil/uL   Hemoglobin 9.3 (L) 13.0 - 17.0 g/dL   HCT 26.9 (L) 39.0 - 52.0 %   MCV 96.8 78.0 - 100.0 fl   MCHC 34.6 30.0 - 36.0 g/dL   RDW 16.3 (H) 11.5 - 15.5 %   Platelets 153.0 150.0 - 400.0 K/uL   Neutrophils Relative % 82.6 (H) 43.0 - 77.0 %   Lymphocytes Relative 10.6 (L) 12.0 - 46.0 %   Monocytes Relative 6.1 3.0 - 12.0 %   Eosinophils Relative 0.6 0.0 - 5.0 %   Basophils Relative 0.1 0.0 - 3.0 %   Neutro Abs 0.9 (L) 1.4 - 7.7 K/uL   Lymphs Abs 0.1 (L) 0.7 - 4.0 K/uL   Monocytes Absolute 0.1 0.1 - 1.0 K/uL   Eosinophils Absolute 0.0 0.0 - 0.7 K/uL   Basophils Absolute 0.0 0.0 - 0.1 K/uL  VITAMIN D 25 Hydroxy (Vit-D Deficiency, Fractures)  Result Value Ref Range   VITD 26.18 (L) 30.00 - 100.00 ng/mL   Assessment & Plan:  Unclear why gabapentin was recently started - will discontinue at this time.  Problem List Items Addressed This Visit    Vitamin D deficiency    Update labs.  Relevant Orders   VITAMIN D 25 Hydroxy (Vit-D Deficiency, Fractures) (Completed)   Steroid-induced hyperglycemia   Prostate cancer ([place])    Upcoming tumor board appt later this week to review recent dx and management options.       Pancytopenia ([place]) - Primary    Presumed pancytopenia related. Plans to receive neupogen later this week through onc. Update CBC.       Relevant Orders   Comprehensive metabolic panel (Completed)   CBC with Differential/Platelet (Completed)   Metastasis to brain ([place])   MDS  (myelodysplastic syndrome) ([place])    Complicated history. Recent diagnosis. Also h/o prostate cancer, and h/o metastatic melanoma. Appreciate onc care.       Malignant melanoma (H[facility])   General weakness    Fatigue with weakness - stable period on ritalin 31m bid.       Dyslipidemia    crestor was discontinued in the hospital - he feels markedly better off crestor- will stay off.       Controlled type 2 diabetes mellitus with hyperglycemia, without long-term current use of insulin (H[facility])    Steroid related. Currently on glipizide 537mbid. Morning readings remain well controlled however has marked high readings in evenings. Will add novolog low dose sliding scale for post-dinner hyperglycemia. Anticipate insulin need will continue to diminish with prednisone taper.       Chronic kidney disease, stage III (moderate)    Update renal function.       Advanced care planning/counseling discussion    Reviewed with patient and daughter - they desire to stay at home as much as possible. He would want to receive non-invasive and supportive therapies for MDS but does not want aggressive treatment like chemo.       Acute gout due to renal impairment involving multiple sites    On extended prednisone taper, with resolution of gout flare.  Has also been started on allopurinol 20067maily. Caution with chronic kidney disease. He also uses colchicine PRN      Acquired hypothyroidism    Update TFTs on levothyroxine 137m78maily.       Relevant Orders   TSH (Completed)       Meds ordered this encounter  Medications  . DISCONTD: gabapentin (NEURONTIN) 100 MG capsule    Sig: Take 1 capsule (100 mg total) by mouth 2 (two) times daily.    Dispense:  1 capsule    Refill:  0    Please discontinue gabapentin.   Orders Placed This Encounter  Procedures  . Comprehensive metabolic panel  . TSH  . CBC with Differential/Platelet  . VITAMIN D 25 Hydroxy (Vit-D Deficiency, Fractures)    Patient  Instructions  Stop gabapentin.  Continue other medicines  Labs today.  Return in 3-4 weeks for follow up visit.    Follow up plan: Return in about 4 weeks (around 02/13/2019).  JaviRia Bush

## 2019-01-17 ENCOUNTER — Telehealth: Payer: Self-pay

## 2019-01-17 ENCOUNTER — Encounter (HOSPITAL_COMMUNITY): Payer: Self-pay | Admitting: Hematology

## 2019-01-17 ENCOUNTER — Encounter: Payer: Self-pay | Admitting: General Practice

## 2019-01-17 ENCOUNTER — Ambulatory Visit
Admission: RE | Admit: 2019-01-17 | Discharge: 2019-01-17 | Disposition: A | Discharge status: Home / Self Care | Payer: Medicare Other | Source: Ambulatory Visit | Attending: Radiation Oncology | Admitting: Radiation Oncology

## 2019-01-17 ENCOUNTER — Telehealth: Payer: Self-pay | Admitting: Hematology

## 2019-01-17 ENCOUNTER — Encounter: Payer: Self-pay | Admitting: Medical Oncology

## 2019-01-17 ENCOUNTER — Inpatient Hospital Stay: Payer: Medicare Other | Attending: Oncology | Admitting: Oncology

## 2019-01-17 DIAGNOSIS — C61 Malignant neoplasm of prostate: Secondary | ICD-10-CM | POA: Diagnosis not present

## 2019-01-17 DIAGNOSIS — R5381 Other malaise: Secondary | ICD-10-CM | POA: Diagnosis not present

## 2019-01-17 DIAGNOSIS — R972 Elevated prostate specific antigen [PSA]: Secondary | ICD-10-CM | POA: Diagnosis not present

## 2019-01-17 DIAGNOSIS — C439 Malignant melanoma of skin, unspecified: Secondary | ICD-10-CM | POA: Diagnosis not present

## 2019-01-17 DIAGNOSIS — Z923 Personal history of irradiation: Secondary | ICD-10-CM | POA: Diagnosis not present

## 2019-01-17 DIAGNOSIS — E1165 Type 2 diabetes mellitus with hyperglycemia: Secondary | ICD-10-CM | POA: Diagnosis not present

## 2019-01-17 DIAGNOSIS — Z85841 Personal history of malignant neoplasm of brain: Secondary | ICD-10-CM | POA: Diagnosis not present

## 2019-01-17 DIAGNOSIS — Z7984 Long term (current) use of oral hypoglycemic drugs: Secondary | ICD-10-CM | POA: Diagnosis not present

## 2019-01-17 DIAGNOSIS — N179 Acute kidney failure, unspecified: Secondary | ICD-10-CM | POA: Diagnosis not present

## 2019-01-17 DIAGNOSIS — M109 Gout, unspecified: Secondary | ICD-10-CM | POA: Diagnosis not present

## 2019-01-17 DIAGNOSIS — C9240 Acute promyelocytic leukemia, not having achieved remission: Secondary | ICD-10-CM | POA: Diagnosis not present

## 2019-01-17 DIAGNOSIS — Z7982 Long term (current) use of aspirin: Secondary | ICD-10-CM | POA: Diagnosis not present

## 2019-01-17 LAB — SURGICAL PATHOLOGY

## 2019-01-17 NOTE — Consult Note (Signed)
Telehealth Visit     01/17/2019   --------------------------------------------------------------------------------   David [name]  MRN: [id number]  DOB: [birth year], 78 year old Male  SSN:    PRIMARY CARE:    REFERRING:  [name], MD  PROVIDER:  Raynelle Bring, M.D.  LOCATION:  [facility] [phone]     --------------------------------------------------------------------------------   CC/HPI: CC: Prostate Cancer   Physician requesting consult: Dr. John Giovanni  PCP: Dr. Ishmael Holter  Location of consult: [facility] (multiple attempts were made to connect with a face-to-face format, this proved to be unsuccessful in we proceeded with a telephone visit due to technical difficulties)   David [name] is a 78 year old male with multiple medical comorbidities including metastatic melanoma with metastatic disease to the lung and brain, CAD and MI, hyperlipidemia, hypertension, hypothyroidism, intracerebral hemorrhage, pacemaker placement due to 3rd degree heart block, sleep apnea, and stroke. He has continued to have his PSA monitored due to the fact that he has been on testosterone replacement therapy which he takes due to significant fatigue. His PSA has increased up to 5.6 last year causing him to temporarily stop TRT. His PSA decreased but he restarted therapy due to worsening quality of life. His PSA again increased back on therapy up to 4.35. He was seen by Dr. Bernardo Heater and underwent a TRUS biopsy of the prostate on 11/14/18 that demonstrated Gleason 3+4=7 adenocarcinoma with 8 out of 12 biopsy cores positive.   Most recently, he has been hospitalized and was found to be pancytopenic. He ultimately underwent a bone marrow biopsy that did demonstrate myelodysplastic syndrome. He is receiving supportive care per Dr. Burr Medico. Since discharge from the hospital/inpatient rehab, he has been feeling much improved. Even off testosterone  replacement therapy, he apparently feels excellent.   Family history: No prostate cancer.   Imaging studies: He did undergo staging imaging during his hospitalization. A CT scan of the abdomen and pelvis and bone scan were both negative for evidence of metastatic disease.   PSH: CABG x 2, no abdominal surgeries.   TNM stage: cT1c Nx Mx  PSA: 4.35  Gleason score: 3+4=7  Biopsy (11/14/18): 8/12 cores positive  Left: L lateral apex (70%, 3+4=7), L apex (80%, 3+4=7), L lateral mid (25%, 3+4=7), L mid (95%, 3+4=7)  Right: R apex (25%, 3+3=6), R lateral apex (60%, 3+3=6), R lateral mid (80%, 3+3=6), R lateral base (70%, 3+3=6)  Prostate volume:  PSAD:   Nomogram  OC disease: 41%  EPE: 59%  SVI: 6%  LNI: 8%  PFS (5 year, 10 year): 82%, 70%   Urinary function: IPSS is unknown.  Erectile function: SHIM score is unknown.     ALLERGIES: No Allergies    MEDICATIONS: Aspirin  Levothyroxine Sodium  Metoprolol Succinate  Amlodipine Besylate  Citalopram Hbr  Methylphenidate Er  Pentoxifylline  Prednisone  Rosuvastatin Calcium  Testosterone  Trazodone Hcl     GU PSH: None   NON-GU PSH: CABG (coronary artery bypass grafting) Pacemaker placement     GU PMH: No GU PMH    NON-GU PMH: No Non-GU PMH    FAMILY HISTORY: No Family History    SOCIAL HISTORY: No Social History    REVIEW OF SYSTEMS:    GU Review Male:   Patient denies frequent urination, hard to postpone urination, burning/ pain with urination, get up at night to urinate, leakage of urine, stream starts and stops, trouble starting your streams, and have to strain to  urinate .  Gastrointestinal (Upper):   Patient denies nausea and vomiting.  Gastrointestinal (Lower):   Patient denies diarrhea and constipation.  Constitutional:   Patient denies fever, night sweats, weight loss, and fatigue.  Skin:   Patient denies skin rash/ lesion and itching.  Eyes:   Patient denies blurred vision and double vision.  Ears/ Nose/  Throat:   Patient denies sore throat and sinus problems.  Hematologic/Lymphatic:   Patient denies swollen glands and easy bruising.  Cardiovascular:   Patient denies leg swelling and chest pains.  Respiratory:   Patient denies cough and shortness of breath.  Endocrine:   Patient denies excessive thirst.  Musculoskeletal:   Patient denies back pain and joint pain.  Neurological:   Patient denies headaches and dizziness.  Psychologic:   Patient denies anxiety and depression.   PAST DATA REVIEWED:  Source Of History:  Patient  Lab Test Review:   PSA  Records Review:   Pathology Reports  X-Ray Review: C.T. Abdomen/Pelvis: Reviewed Films.  Bone Scan: Reviewed Films.    Notes:                     CLINICAL DATA: Fever, metastatic melanoma     EXAM:  CT CHEST, ABDOMEN AND PELVIS WITHOUT CONTRAST     TECHNIQUE:  Multidetector CT imaging of the chest, abdomen and pelvis was  performed following the standard protocol without IV contrast.     COMPARISON: PET-CT, 09/18/2018     FINDINGS:  CT CHEST FINDINGS     Cardiovascular: Aortic atherosclerosis. Normal heart size.  Three-vessel coronary artery calcifications and/or stents status  post median sternotomy and CABG. Left chest multi lead pacer. No  pericardial effusion.     Mediastinum/Nodes: No enlarged mediastinal, hilar, or axillary lymph  nodes. Thyroid gland, trachea, and esophagus demonstrate no  significant findings.     Lungs/Pleura: Small bilateral pleural effusions with associated  atelectasis or consolidation. There is a small, stable, calcified  benign nodule of the lingula measuring 5 mm (series 6, image 87).     Musculoskeletal: No chest wall mass or suspicious bone lesions  identified.     CT ABDOMEN PELVIS FINDINGS     Hepatobiliary: No solid liver abnormality is seen. No gallstones,  gallbladder wall thickening, or biliary dilatation.     Pancreas: Unremarkable. No pancreatic ductal dilatation or  surrounding  inflammatory changes.     Spleen: Normal in size without significant abnormality.     Adrenals/Urinary Tract: Adrenal glands are unremarkable. Multiple  low-attenuation lesions of the kidneys, incompletely characterized  although likely cysts. Bladder is unremarkable.     Stomach/Bowel: Stomach is within normal limits. Appendix appears  normal. No evidence of bowel wall thickening, distention, or  inflammatory changes. Pancolonic diverticulosis. Moderate burden of  stool colon.     Vascular/Lymphatic: Tortuous and severely atherosclerotic aorta,  measuring up to 3.1 x 2.9 cm in the infrarenal portion of the  vessel. No enlarged abdominal or pelvic lymph nodes.     Reproductive: No mass or other abnormality.     Other: No abdominal wall hernia or abnormality. No abdominopelvic  ascites.     Musculoskeletal: Levoscoliosis of the lumbar spine with associated  disc degenerative disease.     IMPRESSION:  1. Small bilateral pleural effusions with associated atelectasis or  consolidation.     2. No definite CT findings of the chest, abdomen, or pelvis to  explain fevers.     3. No noncontrast CT evidence  of metastatic disease in the chest,  abdomen, or pelvis.     4. Redemonstrated infrarenal abdominal aortic aneurysm measuring up  to 3.1 cm. Recommend followup by ultrasound in 3 years. This  recommendation follows ACR consensus guidelines: White Paper of the  ACR Incidental Findings Committee II on Vascular Findings. J Am Coll  Radiol 2013; 10:789-794. Aortic aneurysm NOS (ICD10-I71.9)     5. Coronary artery disease. Aortic Atherosclerosis (ICD10-I70.0).        Electronically Signed  By: Eddie Candle M.D.  On: 01/06/2019 08:16   CLINICAL DATA: History of prostate cancer.     EXAM:  NUCLEAR MEDICINE WHOLE BODY BONE SCAN     TECHNIQUE:  Whole body anterior and posterior images were obtained approximately  3 hours after intravenous injection of radiopharmaceutical.      RADIOPHARMACEUTICALS: 21.7 mCi Technetium-28mMDP IV     COMPARISON: PET scan of September 18, 2018. Radiographs of November 26, 2017.     FINDINGS:  Minimal abnormal uptake is seen involving both feet consistent with  degenerative change. Abnormal uptake is seen involving the right  glenohumeral joint suggesting degenerative change. Degenerative  changes seen involving the left knee. No other areas of abnormal  uptake are noted.     IMPRESSION:  No definite scintigraphic evidence of osseous metastases.        Electronically Signed  By: JMarijo ConceptionM.D.  On: 12/11/2018 15:19   PROCEDURES:          Telehealth This patient encounter is appropriate and reasonable under the circumstances given the patient's particular presentation at this time. The patient has been advised of the potential risks and limitations of this mode of treatment (including, but not limited to, the absence of in-person examination) and has agreed to be treated in a remote fashion in spite of them.   Any and all of the patient's/patient's family's questions on this issue have been answered, and I have made no promises or guarantees to the patient. The patient has also been advised to contact this office for worsening conditions or problems, and seek emergency medical treatment and/or call 911 if the patient deems either necessary.    ASSESSMENT:      ICD-10 Details  1 GU:   Prostate Cancer - C61   2 NON-GU:   Low testosterone - E34.9      PLAN:           Document Letter(s):  Created for Patient: Clinical Summary         Notes:   1. Favorable intermediate risk prostate cancer: We reviewed his prostate cancer situation in detail today the. We discussed this in the context of his age and other comorbidities which are significant. After further discussion, he is unlikely to developed symptomatic, metastatic disease related to his prostate cancer considering his competing comorbidities. As such, continued  surveillance would be completely appropriate. Although we did discuss the option of curative therapy with radiation treatment, the risk/benefit ratio of curative treatment at this time would be questionable. I have had a detailed discussion with his daughter, Dr. KSilas Sacramentotoday as well. She and her father feel comfortable with proceeding with surveillance at this time. She has requested that I schedule him for follow-up and we have discussed having him follow up in the next few months for a PSA/DRE. If he does have concerning PSA kinetics, develops symptoms suggestive of disease progression, or simply becomes concerned about ongoing surveillance, we will discuss the options of  changing our approach. He and his daughter feel quite comfortable with this approach.   2. Testosterone deficiency: He mainly has been on testosterone replacement therapy to increase his energy level. He has been off therapy recently and currently feels quite good. As such, I did not recommend restarting testosterone replacement therapy at this time. However, if his quality of life does begin suffering related to a low testosterone level, we did discuss the option of restarting therapy. I think this would be of relatively low risk related to his prostate cancer especially if he could receive significant benefit to his quality of life. He and his daughter will let me know should to become more symptomatic and we can have further discussion about treatment. Otherwise, he will remain off therapy for the time being and we will re-evaluate the symptoms at his next follow-up visit.   Cc: Dr. [name]  Dr. Silas Sacramento  Dr. Tyler Pita  Dr. Zola Button  Dr. John Giovanni  Dr. Ishmael Holter    E & M CODE: I spent at least 27 minutes face to face with the patient, more than 50% of that time was spent on counseling and/or coordinating care.

## 2019-01-17 NOTE — Telephone Encounter (Signed)
Noted. Known MDS. Plan was for neupogen shot today.

## 2019-01-17 NOTE — Progress Notes (Signed)
Hematology and Oncology Follow Up for Telemedicine Visits  David [name] [id number] 1940/11/30 78 y.o. 01/17/2019 9:06 AM David [name], MDGutierrez, David Hatchet, MD   I connected with David [name] on 01/17/19 at  8:30 AM EDT by telephone visit and verified that I am speaking with the correct person using two identifiers.   I discussed the limitations, risks, security and privacy concerns of performing an evaluation and management service by telemedicine and the availability of in-person appointments. I also discussed with the patient that there may be a patient responsible charge related to this service. The patient expressed understanding and agreed to proceed.  Other persons participating in the visit and their role in the encounter: His daughter David [name]   Patient's location: Home Provider's location: Office   Principle Diagnosis: 78 year old man with prostate cancer diagnosed in August 2020.  He was found to have Gleason score of 3+4 = 7 with pattern of 3+3 = 6 at the time of diagnosis.  His PSA was 4.35 in June 2020.   Prior Therapy:  He status post prostate biopsy completed in November 14, 2018.  The pathology showed 8 out of 12 cores positive for adenocarcinoma.  He had a Gleason score 3+4 = 7 and 4 cores.  The remaining 4 cores showed Gleason score 3+3 = 6.  Current therapy: Under evaluation for possible therapy.  Interim History: David [name] was presented today in the prostate cancer multidisciplinary clinic.  Subsequent visit via phone was completed with him and his daughter.  He is a 20 year old man with history of melanoma with isolated brain metastasis that was treated with surgical resection, radiation therapy as well as systemic therapy utilizing immune therapy.  He has been disease-free since the conclusion of therapy in 2018.  He was found to have an elevated PSA in June 2020 as high as 4.35.  In September 2019 was 5.6.  He underwent a prostate biopsy by David [name] at  [place].  Since that time, he required multiple hospitalizations for unrelated issues including gout, cellulitis and was found to have pancytopenia.  Bone marrow biopsy completed on September 30 of 2020 showed MDS without increased blasts.  He has been started on Neupogen under the care of David [name].   Since his last discharge, he is recovering slowly at this time and is ambulating slowly and receiving physical therapy.  Is not reporting any major urinary symptoms at this time including hematuria or dysuria.  He has been on testosterone supplements also therapy has been withheld for the time being.    Medications: I have reviewed the patient's current medications.  Current Outpatient Medications  Medication Sig Dispense Refill  . acetaminophen (TYLENOL) 325 MG tablet Take 2 tablets (650 mg total) by mouth every 6 (six) hours as needed for mild pain (or Fever >/= 101).    Marland Kitchen allopurinol (ZYLOPRIM) 100 MG tablet Take 2 tablets (200 mg total) by mouth daily. 180 tablet 1  . amLODipine (NORVASC) 5 MG tablet Take 1 tablet (5 mg total) by mouth daily. 32 tablet 11  . aspirin EC 81 MG tablet Take 81 mg by mouth every evening.     . Blood Glucose Monitoring Suppl (ONETOUCH VERIO) w/Device KIT 1 kit by Does not apply route as directed. E11.65 1 kit 0  . Cholecalciferol (VITAMIN D3) 25 MCG (1000 UT) CAPS Take 1 capsule (1,000 Units total) by mouth daily. 30 capsule 0  . citalopram (CELEXA) 10 MG tablet Take 1 tablet (10 mg total)  by mouth daily. 90 tablet 1  . colchicine 0.6 MG tablet Take 1 tablet (0.6 mg total) by mouth daily. 30 tablet 0  . glipiZIDE (GLUCOTROL) 5 MG tablet Take 1 tablet (5 mg total) by mouth 2 (two) times daily before a meal. 60 tablet 0  . glucose blood test strip E11.65 Use as instructed to check sugars three time daily and as needed 100 each 12  . insulin aspart (NOVOLOG FLEXPEN) 100 UNIT/ML FlexPen Use three times daily with meals as needed per sliding scale:  2 units for every 50 units over 150 (2u for 150-199, 4u for 200-249, 6u for 250-299, etc) 3 mL 1  . Lancets (ONETOUCH ULTRASOFT) lancets E11.65 Use as instructed to check sugars three time daily and as needed 100 each 12  . levothyroxine (SYNTHROID) 137 MCG tablet TAKE 1 TABLET BY MOUTH ONCE DAILY BEFORE BREAKFAST (Patient taking differently: Take 137 mcg by mouth daily before breakfast. ) 30 tablet 11  . methylphenidate (RITALIN) 5 MG tablet Take 1 tablet (5 mg total) by mouth 2 (two) times daily with breakfast and lunch. 60 tablet 0  . metoprolol succinate (TOPROL-XL) 50 MG 24 hr tablet TAKE 1 AND 1/2 TABLET (75 MG) BY MOUTH EVERY DAY WITH OR IMMEDIATELY FOLLOWING A MEAL *DO NOT CRUSH* (Patient taking differently: Take 75 mg by mouth daily. ) 50 tablet 11  . polyethylene glycol (MIRALAX / GLYCOLAX) 17 g packet Take 17 g by mouth 2 (two) times daily. (Patient taking differently: Take 17 g by mouth daily. ) 14 each 0  . predniSONE (DELTASONE) 10 MG tablet Take 4 tablets (40 mg total) by mouth daily with breakfast for 3 days, THEN 3 tablets (30 mg total) daily with breakfast for 3 days, THEN 2 tablets (20 mg total) daily with breakfast for 3 days, THEN 1 tablet (10 mg total) daily with breakfast for 3 days, THEN 0.5 tablets (5 mg total) daily with breakfast for 3 days. 32 tablet 0  . senna (SENOKOT) 8.6 MG TABS tablet Take 1 tablet (8.6 mg total) by mouth at bedtime. 120 tablet 0  . traZODone (DESYREL) 50 MG tablet Take 0.5-1 tablets (25-50 mg total) by mouth at bedtime. (Patient taking differently: Take 25 mg by mouth at bedtime as needed for sleep. ) 30 tablet 1   No current facility-administered medications for this visit.    Facility-Administered Medications Ordered in Other Visits  Medication Dose Route Frequency Provider Last Rate Last Dose  . 0.9 %  sodium chloride infusion  1,000 mL Intravenous Once David Merle, MD         Allergies:  Allergies  Allergen Reactions  . Ezetimibe Other (See  Comments)    Unknown reaction  . Simvastatin Other (See Comments)    Reaction:  Gave pt a fever  Fever - temp of 103.   In 2003    Past Medical History, Surgical history, Social history, and Family History were reviewed and updated.    Lab Results: Lab Results  Component Value Date   WBC 1.1 Repeated and verified X2. (LL) 01/16/2019   HGB 9.3 (L) 01/16/2019   HCT 26.9 (L) 01/16/2019   MCV 96.8 01/16/2019   PLT 153.0 01/16/2019     Chemistry      Component Value Date/Time   NA 132 (L) 01/16/2019 1307   NA 140 03/26/2017 0928   K 4.7 01/16/2019 1307   K 4.7 03/26/2017 0928   CL 99 01/16/2019 1307   CO2 24 01/16/2019 1307  CO2 21 (L) 03/26/2017 0928   BUN 75 (H) 01/16/2019 1307   BUN 19.3 03/26/2017 0928   CREATININE 1.37 01/16/2019 1307   CREATININE 1.77 (H) 05/24/2018 1445   CREATININE 1.2 03/26/2017 0928      Component Value Date/Time   CALCIUM 9.3 01/16/2019 1307   CALCIUM 8.7 03/26/2017 0928   ALKPHOS 62 01/16/2019 1307   ALKPHOS 58 03/26/2017 0928   AST 14 01/16/2019 1307   AST 18 05/24/2018 1445   AST 22 03/26/2017 0928   ALT 28 01/16/2019 1307   ALT 13 05/24/2018 1445   ALT 15 03/26/2017 0928   BILITOT 0.3 01/16/2019 1307   BILITOT 0.6 05/24/2018 1445   BILITOT 0.58 03/26/2017 0928        Impression and Plan:  78 year old with:  1.  Prostate cancer diagnosed in August 2020.  He had a Gleason score of 3+4 = 7 and a PSA of 4.35.  His case was discussed today in the prostate cancer multidisciplinary clinic including discussion with the reviewing pathologist of his prostate biopsy.  The natural course of this disease was also reviewed today with the patient and his daughter.  Given the intermediate risk of his prostate cancer and multiple comorbid conditions, there is really no immediate need to pursue definitive therapy for his prostate cancer.  It is reasonable to have a period of active surveillance and subsequently proceed with definitive therapy  with radiation without androgen deprivation if he needs treatment in the immediate future.  The role for systemic therapy from prostate cancer standpoint and medical oncology was discussed today.  He does not require any systemic therapy for his prostate cancer and likelihood of metastatic disease in the near future is low.  He has multiple competing comorbidities including recent diagnosis of myelodysplastic syndrome which are likely will dictate his overall survival more than prostate cancer.  2.  Myelodysplastic syndrome: He is following with David [name] regarding this issue.  He is currently on growth factor support.  3.  Stage IV melanoma: He remains in remission at this time without any evidence of relapsed disease.  4.  Follow-up: He does not require any medical oncology follow-up for his prostate cancer.  We recommended follow-up with urology regularly regarding his prostate cancer and testosterone replacement.   I discussed the assessment and treatment plan with the patient. The patient was provided an opportunity to ask questions and all were answered. The patient agreed with the plan and demonstrated an understanding of the instructions.   The patient was advised to call back or seek an in-person evaluation if the symptoms worsen or if the condition fails to improve as anticipated.  I provided 20 minutes of non face-to-face telephone visit time during this encounter, and > 50% was dedicated to reviewing his disease status, treatment options and answering questions regarding future plan of care.  Zola Button, MD 01/17/2019 9:06 AM

## 2019-01-17 NOTE — Telephone Encounter (Signed)
I spoke with pathologist Dr. Tresa Moore yesterday and today.  Patient's recent bone marrow biopsy cytogenetics showed complex genomic changes, including chromosome 15 and 17 translocation, this is confirmed on FISH today. This is diagnostic for acute promyelocytic leukemia (APL), despite very low blasts in his marrow. Base on his white blood cell less than 10K, and plt>40K, this is consider low risk APL.  His clinical course has been indolent also, with pancytopenia for 3 to 4 months, no clinical evidence of DIC, bleeding or recurrent infection (he had one episode of neutropenic fever and cellulitis).   I have discussed the above findings with patient's daughter Dr. Silas [place]. She was quite shocked when I delivered the news to her over phone. She does not think her father will understand and take the diagnosis over the phone well, so she does not want me to call him directly. She will explained to him over the weekend. I told [name] to call my cell if he has any questions when she delivers the news.   I have made urgent referral to Dr. Elmo Putt at [facility] for treatment, case reviewed with him, he agrees pt is not at immediate risks from his AML related complications, and he plan to see him next Wednesday 10/14. Pt's records were faxed over this afternoon.   I informed [name] about the referral, she agrees with the plan. She knows if pt develops fever, bleeding or other acute changes before his appointment at [facility], she will bring him to [facility] ED for admission.   I will cancel his appointments at our cancer center. I have informed his other local treating physicians.   [name]  01/17/2019

## 2019-01-17 NOTE — Progress Notes (Signed)
Radiation Oncology         [phone] ________________________________  Multidisciplinary Prostate Cancer Clinic  Initial outpatient Consultation - Conducted via Telephone due to current COVID-19 concerns for limiting patient exposure  Name: David [name]. MRN: [id number]  Date: 01/17/2019  DOB: Jul 07, 1940  OA:[id number], [name], MD  Abbie Sons, MD   REFERRING PHYSICIAN: Abbie Sons, MD  DIAGNOSIS: 78 y.o. gentleman with Stage T1c adenocarcinoma of the prostate with Gleason score of 3+4, and PSA of 4.35.    ICD-10-CM   1. Prostate cancer [facility])  C61     HISTORY OF PRESENT ILLNESS: David [name]. is a 78 y.o. male, retired Pharmacist, community, with a diagnosis of prostate cancer. He has a history of metastatic melanoma with a solitary left frontal lobe brain metastasis at diagnosis in 2017, treated with preop SRS followed by craniotomy for tumor resection with Dr. Vertell Limber.  He received systemic immunotherapy and did undergo a second course of SRS in 01/2016 for a new 71m right cerebellar met and has continued in close follow up with Dr. FBurr Medicoand Dr. VMickeal Skinner currently in remission.   More recently, he presented to his PCP with c/o tiredness, malaise, and fatigue. At that time, his PSA was in the upper 3 range and he was noted to have a low testosterone. He was placed on testosterone replacement, which caused a rise in his PSA to 4.9 and then up to 5.6 in 2019. The testosterone was temporarily discontinued due to the PSA elevation, and a repeat PSA decreased to 2.8. He felt terrible off of testosterone so he elected to go back on TRT in 03/2018. He was referred for evaluation in urology by Dr. SBernardo Heateron 04/16/2018,  digital rectal examination performed at that time was benign.  He had a repeat PSA on 09/13/18 which was further elevated at 4.35 and therefore, TRT was discontinued.  He proceeded to transrectal ultrasound with 12 biopsies of the prostate on 11/14/2018.  The prostate volume  measured 27 cc.  Out of 12 core biopsies, 8 were positive.  The maximum Gleason score was 3+4, and this was seen in left mid, left apex, right apex, left lateral mid, and left lateral apex. Additionally, Gleason 3+3 was seen in right lateral base, right mid lateral, and right lateral apex.  His case is complicated by his recent development of pancytopenia/neutropenia. He was hospitalized on 12/16/2018 with neutropenic fever and cellulitis. He again developed neutropenic fever and was hospitalized on 01/02/2019. He underwent bone marrow biopsy while in the hospital on 01/08/2019 and final pathology was consistent with myelodysplastic syndrome, FISH analysis was normal. He is currently receiving Neupogen under the care and direction of Dr. FBurr Medicoand has noticed improvement in his fatigue and resolution of fever.   The patient reviewed the biopsy results with his urologist and he has kindly been referred today to the multidisciplinary prostate cancer clinic for presentation of pathology and radiology studies in our conference for discussion of potential radiation treatment options and clinical evaluation.   PREVIOUS RADIATION THERAPY: Yes  01/27/2016: Single fraction SRS to the right cerebellar brain met to 20 Gy. 10/13/2015: Preop single fraction SRS to the left frontal lesion to 14 Gy.  PAST MEDICAL HISTORY:  Past Medical History:  Diagnosis Date   Anginal pain ([place]    Arthritis    mostly hands   Benign familial hematuria    BPH (benign prostatic hypertrophy)    Brain cancer ([facility]    melanoma with  brain met   Coronary artery disease    GERD (gastroesophageal reflux disease)    High cholesterol    Hypertension    Hypothyroidism    Intracerebral hemorrhage (HCC)    Melanoma (HCC)    Metastatic melanoma to lung (HCC)    Mood disorder ([place])    Myocardial infarction [facility])    Pacemaker    due to syncope, 3rd degree HB (upgrade to [facility]. Jude CRT-P 02/25/13 (Dr. Uvaldo Rising)   Rectal bleed     due to NSAIDS   Renal disorder    Sepsis ([place]) 12/09/2018   Skin cancer    melanoma   Sleep apnea    uses CPAP   Stroke [facility])       PAST SURGICAL HISTORY: Past Surgical History:  Procedure Laterality Date   APPLICATION OF CRANIAL NAVIGATION N/A 10/15/2015   Procedure: APPLICATION OF CRANIAL NAVIGATION;  Surgeon: Erline Levine, MD;  Location: [place] [facility];  Service: Neurosurgery;  Laterality: N/A;   CARDIAC CATHETERIZATION  2013   CARDIAC SURGERY     bypass X 2   COLONOSCOPY WITH PROPOFOL N/A 05/08/2016   diverticulosis, int hem, no f/u needed [name])   CORONARY ARTERY BYPASS GRAFT  2013   LIMA-LAD, SVG-PDA 03/27/11 (Dr. Francee Gentile)   [place] N/A 10/15/2015   Procedure: LEFT FRONTAL CRANIOTOMY TUMOR EXCISION with Curve;  Surgeon: Erline Levine, MD;  Location: [place] NEUR[facility] ORS;  Service: Neurosurgery;  Laterality: N/A;  CRANIOTOMY TUMOR EXCISION   EYE SURGERY     FOOT SURGERY  08/2010   JOINT REPLACEMENT Left    partial knee   KNEE ARTHROSCOPY  12/2008   LYMPH NODE BIOPSY     PACEMAKER INSERTION     TONSILLECTOMY      FAMILY HISTORY:  Family History  Problem Relation Age of Onset   Cancer Mother 49       lung    Hypertension Mother    Hypertension Father    Heart attack Father    Heart disease Father    Rheum arthritis Sister    Cancer Maternal Grandmother     SOCIAL HISTORY:  Social History   Socioeconomic History   Marital status: Married    Spouse name: Not on file   Number of children: 2   Years of education: Not on file   Highest education level: Not on file  Occupational History   Occupation: retired Programme researcher, broadcasting/film/video strain: Not on file   Food insecurity    Worry: Not on file    Inability: Not on file   Transportation needs    Medical: Not on file    Non-medical: Not on file  Tobacco Use   Smoking status: Former Smoker    Packs/day: 1.00    Years: 3.00    Pack years: 3.00    Quit date:  04/10/1957    Years since quitting: 61.8   Smokeless tobacco: Never Used  Substance and Sexual Activity   Alcohol use: Yes    Alcohol/week: 14.0 standard drinks    Types: 10 Glasses of wine, 4 Cans of beer per week   Drug use: No   Sexual activity: Not Currently  Lifestyle   Physical activity    Days per week: Not on file    Minutes per session: Not on file   Stress: Not on file  Relationships   Social connections    Talks on phone: Not on file    Gets together: Not on file  Attends religious service: Not on file    Active member of club or organization: Not on file    Attends meetings of clubs or organizations: Not on file    Relationship status: Not on file   Intimate partner violence    Fear of current or ex partner: Not on file    Emotionally abused: Not on file    Physically abused: Not on file    Forced sexual activity: Not on file  Other Topics Concern   Not on file  Social History Narrative   Lives with wife [name]) with dementia in [facility]   Daughter [name] and son   Occ: retired Pharmacist, community practiced in [place]   Has living will   [name] is health care POA   Requests DNR-- done 09/14/15   Not sure about tube feeds    ALLERGIES: Ezetimibe and Simvastatin  MEDICATIONS:  Current Outpatient Medications  Medication Sig Dispense Refill   acetaminophen (TYLENOL) 325 MG tablet Take 2 tablets (650 mg total) by mouth every 6 (six) hours as needed for mild pain (or Fever >/= 101).     allopurinol (ZYLOPRIM) 100 MG tablet Take 2 tablets (200 mg total) by mouth daily. 180 tablet 1   amLODipine (NORVASC) 5 MG tablet Take 1 tablet (5 mg total) by mouth daily. 32 tablet 11   aspirin EC 81 MG tablet Take 81 mg by mouth every evening.      Blood Glucose Monitoring Suppl (ONETOUCH VERIO) w/Device KIT 1 kit by Does not apply route as directed. E11.65 1 kit 0   Cholecalciferol (VITAMIN D3) 25 MCG (1000 UT) CAPS Take 1 capsule (1,000 Units total)  by mouth daily. 30 capsule 0   citalopram (CELEXA) 10 MG tablet Take 1 tablet (10 mg total) by mouth daily. 90 tablet 1   colchicine 0.6 MG tablet Take 1 tablet (0.6 mg total) by mouth daily. 30 tablet 0   glipiZIDE (GLUCOTROL) 5 MG tablet Take 1 tablet (5 mg total) by mouth 2 (two) times daily before a meal. 60 tablet 0   glucose blood test strip E11.65 Use as instructed to check sugars three time daily and as needed 100 each 12   insulin aspart (NOVOLOG FLEXPEN) 100 UNIT/ML FlexPen Use three times daily with meals as needed per sliding scale: 2 units for every 50 units over 150 (2u for 150-199, 4u for 200-249, 6u for 250-299, etc) 3 mL 1   Lancets (ONETOUCH ULTRASOFT) lancets E11.65 Use as instructed to check sugars three time daily and as needed 100 each 12   levothyroxine (SYNTHROID) 137 MCG tablet TAKE 1 TABLET BY MOUTH ONCE DAILY BEFORE BREAKFAST (Patient taking differently: Take 137 mcg by mouth daily before breakfast. ) 30 tablet 11   methylphenidate (RITALIN) 5 MG tablet Take 1 tablet (5 mg total) by mouth 2 (two) times daily with breakfast and lunch. 60 tablet 0   metoprolol succinate (TOPROL-XL) 50 MG 24 hr tablet TAKE 1 AND 1/2 TABLET (75 MG) BY MOUTH EVERY DAY WITH OR IMMEDIATELY FOLLOWING A MEAL *DO NOT CRUSH* (Patient taking differently: Take 75 mg by mouth daily. ) 50 tablet 11   polyethylene glycol (MIRALAX / GLYCOLAX) 17 g packet Take 17 g by mouth 2 (two) times daily. (Patient taking differently: Take 17 g by mouth daily. ) 14 each 0   predniSONE (DELTASONE) 10 MG tablet Take 4 tablets (40 mg total) by mouth daily with breakfast for 3 days, THEN 3 tablets (30  mg total) daily with breakfast for 3 days, THEN 2 tablets (20 mg total) daily with breakfast for 3 days, THEN 1 tablet (10 mg total) daily with breakfast for 3 days, THEN 0.5 tablets (5 mg total) daily with breakfast for 3 days. 32 tablet 0   senna (SENOKOT) 8.6 MG TABS tablet Take 1 tablet (8.6 mg total) by mouth at  bedtime. 120 tablet 0   traZODone (DESYREL) 50 MG tablet Take 0.5-1 tablets (25-50 mg total) by mouth at bedtime. (Patient taking differently: Take 25 mg by mouth at bedtime as needed for sleep. ) 30 tablet 1   No current facility-administered medications for this encounter.    Facility-Administered Medications Ordered in Other Encounters  Medication Dose Route Frequency Provider Last Rate Last Dose   0.9 %  sodium chloride infusion  1,000 mL Intravenous Once [name], MD        REVIEW OF SYSTEMS:  On review of systems, the patient reports that he is doing well overall. He denies any chest pain, shortness of breath, cough, fevers, chills, night sweats, unintended weight changes. He denies any bowel disturbances, and denies abdominal pain, nausea or vomiting. He denies any new musculoskeletal or joint aches or pains. A complete review of systems is obtained and is otherwise negative.  PHYSICAL EXAM:  Wt Readings from Last 3 Encounters:  01/16/19 200 lb 3 oz (90.8 kg)  01/03/19 199 lb 15.3 oz (90.7 kg)  12/31/18 200 lb (90.7 kg)   Temp Readings from Last 3 Encounters:  01/16/19 97.8 F (36.6 C) (Temporal)  01/09/19 97.9 F (36.6 C)  12/28/18 97.6 F (36.4 C) (Oral)   BP Readings from Last 3 Encounters:  01/16/19 118/80  01/10/19 99/68  12/28/18 113/85   Pulse Readings from Last 3 Encounters:  01/16/19 79  01/10/19 81  12/28/18 80    /10  Physical exam not performed in light of telephone encounter.   KPS = 80  100 - Normal; no complaints; no evidence of disease. 90   - Able to carry on normal activity; minor signs or symptoms of disease. 80   - Normal activity with effort; some signs or symptoms of disease. 29   - Cares for self; unable to carry on normal activity or to do active work. 60   - Requires occasional assistance, but is able to care for most of his personal needs. 50   - Requires considerable assistance and frequent medical care. 69   - Disabled; requires  special care and assistance. 48   - Severely disabled; hospital admission is indicated although death not imminent. 31   - Very sick; hospital admission necessary; active supportive treatment necessary. 10   - Moribund; fatal processes progressing rapidly. 0     - Dead  Karnofsky DA, Abelmann WH, Craver LS and Burchenal [facility] [phone]) The use of the nitrogen mustards in the palliative treatment of carcinoma: with particular reference to bronchogenic carcinoma Cancer 1 [id number]  LABORATORY DATA:  Lab Results  Component Value Date   WBC 1.1 Repeated and verified X2. (LL) 01/16/2019   HGB 9.3 (L) 01/16/2019   HCT 26.9 (L) 01/16/2019   MCV 96.8 01/16/2019   PLT 153.0 01/16/2019   Lab Results  Component Value Date   NA 132 (L) 01/16/2019   K 4.7 01/16/2019   CL 99 01/16/2019   CO2 24 01/16/2019   Lab Results  Component Value Date   ALT 28 01/16/2019   AST 14 01/16/2019   ALKPHOS 62  01/16/2019   BILITOT 0.3 01/16/2019     RADIOGRAPHY: Ct Abdomen Pelvis Wo Contrast  Result Date: 01/06/2019 CLINICAL DATA:  Fever, metastatic melanoma EXAM: CT CHEST, ABDOMEN AND PELVIS WITHOUT CONTRAST TECHNIQUE: Multidetector CT imaging of the chest, abdomen and pelvis was performed following the standard protocol without IV contrast. COMPARISON:  PET-CT, 09/18/2018 FINDINGS: CT CHEST FINDINGS Cardiovascular: Aortic atherosclerosis. Normal heart size. Three-vessel coronary artery calcifications and/or stents status post median sternotomy and CABG. Left chest multi lead pacer. No pericardial effusion. Mediastinum/Nodes: No enlarged mediastinal, hilar, or axillary lymph nodes. Thyroid gland, trachea, and esophagus demonstrate no significant findings. Lungs/Pleura: Small bilateral pleural effusions with associated atelectasis or consolidation. There is a small, stable, calcified benign nodule of the lingula measuring 5 mm (series 6, image 87). Musculoskeletal: No chest wall mass or suspicious bone lesions identified.  CT ABDOMEN PELVIS FINDINGS Hepatobiliary: No solid liver abnormality is seen. No gallstones, gallbladder wall thickening, or biliary dilatation. Pancreas: Unremarkable. No pancreatic ductal dilatation or surrounding inflammatory changes. Spleen: Normal in size without significant abnormality. Adrenals/Urinary Tract: Adrenal glands are unremarkable. Multiple low-attenuation lesions of the kidneys, incompletely characterized although likely cysts. Bladder is unremarkable. Stomach/Bowel: Stomach is within normal limits. Appendix appears normal. No evidence of bowel wall thickening, distention, or inflammatory changes. Pancolonic diverticulosis. Moderate burden of stool colon. Vascular/Lymphatic: Tortuous and severely atherosclerotic aorta, measuring up to 3.1 x 2.9 cm in the infrarenal portion of the vessel. No enlarged abdominal or pelvic lymph nodes. Reproductive: No mass or other abnormality. Other: No abdominal wall hernia or abnormality. No abdominopelvic ascites. Musculoskeletal: Levoscoliosis of the lumbar spine with associated disc degenerative disease. IMPRESSION: 1. Small bilateral pleural effusions with associated atelectasis or consolidation. 2. No definite CT findings of the chest, abdomen, or pelvis to explain fevers. 3. No noncontrast CT evidence of metastatic disease in the chest, abdomen, or pelvis. 4. Redemonstrated infrarenal abdominal aortic aneurysm measuring up to 3.1 cm. Recommend followup by ultrasound in 3 years. This recommendation follows ACR consensus guidelines: White Paper of the ACR Incidental Findings Committee II on Vascular Findings. J Am Coll Radiol 2013; 10:789-794. Aortic aneurysm NOS (ICD10-I71.9) 5.  Coronary artery disease.  Aortic Atherosclerosis (ICD10-I70.0). Electronically Signed   By: Eddie Candle M.D.   On: 01/06/2019 08:16   Dg Orthopantogram  Result Date: 12/23/2018 CLINICAL DATA:  Acute pulpitis.  Lower right jaw pain EXAM: ORTHOPANTOGRAM/PANORAMIC COMPARISON:  None.  FINDINGS: No evidence of periapical abscess or acute bony abnormality. IMPRESSION: No acute findings. Electronically Signed   By: Rolm Baptise M.D.   On: 12/23/2018 09:56   Dg Ankle 2 Views Left  Result Date: 01/06/2019 CLINICAL DATA:  Concern for infection of left ankle, swelling and redness noted to medial aspect of left ankle. H/o metastatic melanoma. H/o gout. EXAM: LEFT ANKLE - 2 VIEW COMPARISON:  Left ankle radiographs 08/27/2015 FINDINGS: There is bony irregularity at the tip of the medial malleolus which may be secondary to the prior fracture seen on radiograph from 08/28/2015. No other focal bony abnormality identified in the ankle. The ankle mortise is intact. The tibiotalar joint space is preserved. Calcaneal spurring. There is vascular calcification in the soft tissues. There is medial soft tissue swelling. IMPRESSION: 1. Bony irregularity at the tip of the medial malleolus likely secondary to the remote fracture seen on radiographs in 08/2015. Difficult to completely exclude superimposed infection. 2. Medial soft tissue swelling. Electronically Signed   By: Audie Pinto M.D.   On: 01/06/2019 19:11   Ct Chest Wo  Contrast  Result Date: 01/06/2019 CLINICAL DATA:  Fever, metastatic melanoma EXAM: CT CHEST, ABDOMEN AND PELVIS WITHOUT CONTRAST TECHNIQUE: Multidetector CT imaging of the chest, abdomen and pelvis was performed following the standard protocol without IV contrast. COMPARISON:  PET-CT, 09/18/2018 FINDINGS: CT CHEST FINDINGS Cardiovascular: Aortic atherosclerosis. Normal heart size. Three-vessel coronary artery calcifications and/or stents status post median sternotomy and CABG. Left chest multi lead pacer. No pericardial effusion. Mediastinum/Nodes: No enlarged mediastinal, hilar, or axillary lymph nodes. Thyroid gland, trachea, and esophagus demonstrate no significant findings. Lungs/Pleura: Small bilateral pleural effusions with associated atelectasis or consolidation. There is a  small, stable, calcified benign nodule of the lingula measuring 5 mm (series 6, image 87). Musculoskeletal: No chest wall mass or suspicious bone lesions identified. CT ABDOMEN PELVIS FINDINGS Hepatobiliary: No solid liver abnormality is seen. No gallstones, gallbladder wall thickening, or biliary dilatation. Pancreas: Unremarkable. No pancreatic ductal dilatation or surrounding inflammatory changes. Spleen: Normal in size without significant abnormality. Adrenals/Urinary Tract: Adrenal glands are unremarkable. Multiple low-attenuation lesions of the kidneys, incompletely characterized although likely cysts. Bladder is unremarkable. Stomach/Bowel: Stomach is within normal limits. Appendix appears normal. No evidence of bowel wall thickening, distention, or inflammatory changes. Pancolonic diverticulosis. Moderate burden of stool colon. Vascular/Lymphatic: Tortuous and severely atherosclerotic aorta, measuring up to 3.1 x 2.9 cm in the infrarenal portion of the vessel. No enlarged abdominal or pelvic lymph nodes. Reproductive: No mass or other abnormality. Other: No abdominal wall hernia or abnormality. No abdominopelvic ascites. Musculoskeletal: Levoscoliosis of the lumbar spine with associated disc degenerative disease. IMPRESSION: 1. Small bilateral pleural effusions with associated atelectasis or consolidation. 2. No definite CT findings of the chest, abdomen, or pelvis to explain fevers. 3. No noncontrast CT evidence of metastatic disease in the chest, abdomen, or pelvis. 4. Redemonstrated infrarenal abdominal aortic aneurysm measuring up to 3.1 cm. Recommend followup by ultrasound in 3 years. This recommendation follows ACR consensus guidelines: White Paper of the ACR Incidental Findings Committee II on Vascular Findings. J Am Coll Radiol 2013; 10:789-794. Aortic aneurysm NOS (ICD10-I71.9) 5.  Coronary artery disease.  Aortic Atherosclerosis (ICD10-I70.0). Electronically Signed   By: Eddie Candle M.D.   On:  01/06/2019 08:16   US Renal  Result Date: 01/07/2019 CLINICAL DATA:  Acute kidney injury.  Urinary retention. EXAM: RENAL / URINARY TRACT ULTRASOUND COMPLETE COMPARISON:  CT chest, abdomen and pelvis 01/05/2019. FINDINGS: Right Kidney: Renal measurements: 10.1 x 5.4 x 4.7 cm = volume: 135.1 mL . Echogenicity within normal limits. No solid mass or hydronephrosis visualized. 2.8 cm simple cyst noted. Left Kidney: Renal measurements: 12.4 x 5.5 x 5.0 cm = volume: 178.4 mL. Echogenicity within normal limits. No solid mass or hydronephrosis visualized. Simple 4.1 cm cyst noted. Bladder: Appears normal for degree of bladder distention. IMPRESSION: Negative for hydronephrosis.  No acute abnormality. Single simple cysts are seen in each kidney. Electronically Signed   By: Inge Rise M.D.   On: 01/07/2019 14:26   Dg Chest Port 1 View  Result Date: 01/08/2019 CLINICAL DATA:  Shortness of breath EXAM: PORTABLE CHEST 1 VIEW COMPARISON:  01/05/2019 FINDINGS: No significant change in AP portable examination. The lungs are normally aerated. Cardiomegaly status post median sternotomy CABG with left chest multi lead pacer. IMPRESSION: No acute abnormality of the lungs in AP portable projection. Electronically Signed   By: Eddie Candle M.D.   On: 01/08/2019 08:19   Dg Chest Port 1 View  Result Date: 01/05/2019 CLINICAL DATA:  Shortness of breath EXAM: PORTABLE CHEST 1  VIEW COMPARISON:  01/02/2019 FINDINGS: 0359 hours. Asymmetric elevation right hemidiaphragm. The lungs are clear without focal pneumonia, edema, pneumothorax or pleural effusion. The cardiopericardial silhouette is within normal limits for size. Left permanent pacemaker noted. The visualized bony structures of the thorax are intact. Telemetry leads overlie the chest. IMPRESSION: No acute cardiopulmonary findings. Electronically Signed   By: Misty Stanley M.D.   On: 01/05/2019 08:39   Dg Chest Port 1 View  Result Date: 01/02/2019 CLINICAL DATA:   Fever. EXAM: PORTABLE CHEST 1 VIEW COMPARISON:  None. FINDINGS: Low lung volumes limit assessment. Post median sternotomy and CABG. Left-sided pacemaker in place. Unchanged cardiomegaly. Bronchovascular crowding versus vascular congestion. Stable right hilar prominence from prior exam. Patchy infrahilar opacities, left greater than right, nonspecific cough low volume chest. No large pleural effusion. No pneumothorax. IMPRESSION: 1. Low lung volumes with bronchovascular crowding versus vascular congestion. 2. Patchy infrahilar opacities, left greater than right. Limited assessment on low volume exam, favor atelectasis however pneumonia not excluded in the setting of fever. Recommend PA and lateral views on patient is able. 3. Post CABG with mild cardiomegaly, likely accentuated by technique. Electronically Signed   By: Keith Rake M.D.   On: 01/02/2019 22:39   Vas [place] Lower Extremity Venous (dvt)  Result Date: 01/07/2019  Lower Venous Study Indications: Swelling.  Limitations: Poor ultrasound/tissue interface. Comparison Study: No prior study. Performing Technologist: Maudry Mayhew MHA, RDMS, RVT, RDCS  Examination Guidelines: A complete evaluation includes B-mode imaging, spectral Doppler, color Doppler, and power Doppler as needed of all accessible portions of each vessel. Bilateral testing is considered an integral part of a complete examination. Limited examinations for reoccurring indications may be performed as noted.  +---------+---------------+---------+-----------+----------+--------------+  RIGHT     Compressibility Phasicity Spontaneity Properties Thrombus Aging  +---------+---------------+---------+-----------+----------+--------------+  CFV       Full            Yes       Yes                                    +---------+---------------+---------+-----------+----------+--------------+  SFJ       Full                                                              +---------+---------------+---------+-----------+----------+--------------+  FV Prox   Full                                                             +---------+---------------+---------+-----------+----------+--------------+  FV Mid    Full                                                             +---------+---------------+---------+-----------+----------+--------------+  FV Distal Full                                                             +---------+---------------+---------+-----------+----------+--------------+  PFV       Full                                                             +---------+---------------+---------+-----------+----------+--------------+  POP       Full            Yes       Yes                                    +---------+---------------+---------+-----------+----------+--------------+  PTV       Full                                                             +---------+---------------+---------+-----------+----------+--------------+  PERO      Full                                                             +---------+---------------+---------+-----------+----------+--------------+   +---------+---------------+---------+-----------+----------+--------------+  LEFT      Compressibility Phasicity Spontaneity Properties Thrombus Aging  +---------+---------------+---------+-----------+----------+--------------+  CFV       Full            Yes       Yes                                    +---------+---------------+---------+-----------+----------+--------------+  SFJ       Full                                                             +---------+---------------+---------+-----------+----------+--------------+  FV Prox   Full                                                             +---------+---------------+---------+-----------+----------+--------------+  FV Mid    Full                                                              +---------+---------------+---------+-----------+----------+--------------+  FV Distal Full                                                             +---------+---------------+---------+-----------+----------+--------------+  PFV       Full                                                             +---------+---------------+---------+-----------+----------+--------------+  POP       Full            Yes       Yes                                    +---------+---------------+---------+-----------+----------+--------------+  PTV       Full                                                             +---------+---------------+---------+-----------+----------+--------------+  PERO      Full                                                             +---------+---------------+---------+-----------+----------+--------------+     Summary: Right: There is no evidence of deep vein thrombosis in the lower extremity. No cystic structure found in the popliteal fossa. Left: There is no evidence of deep vein thrombosis in the lower extremity. No cystic structure found in the popliteal fossa.  *See table(s) above for measurements and observations. Electronically signed by Deitra Mayo MD on 01/07/2019 at 4:31:21 PM.    Final    Vas [place] Upper Extremity Venous Duplex  Result Date: 01/09/2019 UPPER VENOUS STUDY  Indications: Swelling Risk Factors: None identified. Comparison Study: No prior studies. Performing Technologist: Oliver Hum RVT  Examination Guidelines: A complete evaluation includes B-mode imaging, spectral Doppler, color Doppler, and power Doppler as needed of all accessible portions of each vessel. Bilateral testing is considered an integral part of a complete examination. Limited examinations for reoccurring indications may be performed as noted.  Right Findings: +----------+------------+---------+-----------+----------+-------+  RIGHT      Compressible Phasicity Spontaneous Properties Summary   +----------+------------+---------+-----------+----------+-------+  Subclavian     Full        Yes        Yes                         +----------+------------+---------+-----------+----------+-------+  Left Findings: +----------+------------+---------+-----------+----------+-----------------+  LEFT       Compressible Phasicity Spontaneous Properties      Summary       +----------+------------+---------+-----------+----------+-----------------+  IJV            Full        Yes        Yes                                   +----------+------------+---------+-----------+----------+-----------------+  Subclavian     Full        No  No                                    +----------+------------+---------+-----------+----------+-----------------+  Axillary       Full        Yes        Yes                                   +----------+------------+---------+-----------+----------+-----------------+  Brachial       Full        Yes        Yes                                   +----------+------------+---------+-----------+----------+-----------------+  Radial         Full                                                         +----------+------------+---------+-----------+----------+-----------------+  Ulnar          Full                                                         +----------+------------+---------+-----------+----------+-----------------+  Cephalic     Partial                                     Age Indeterminate  +----------+------------+---------+-----------+----------+-----------------+  Basilic        Full                                                         +----------+------------+---------+-----------+----------+-----------------+  Summary:  Right: No evidence of thrombosis in the subclavian.  Left: No evidence of deep vein thrombosis in the upper extremity. Findings consistent with age indeterminate superficial vein thrombosis involving the left cephalic vein.  *See table(s) above for  measurements and observations.  Diagnosing physician: Servando Snare MD Electronically signed by Servando Snare MD on 01/09/2019 at 2:15:38 PM.    Final       IMPRESSION/PLAN:  This visit was conducted via Telephone to spare the patient unnecessary potential exposure in the healthcare setting during the current COVID-19 pandemic.  78 y.o. gentleman with Stage T1c adenocarcinoma of the prostate with Gleason Score of 3+4, and PSA of 4.35. We discussed the patient's workup and outlined the nature of prostate cancer in the context of his multiple medical comorbidities. The patient's T stage, Gleason's score, and PSA put him into the favorable intermediate risk group. General consensus of the groups at multidisciplinary conference this morning is that the patient is unlikely to suffer significant morbidity related to his prostate cancer considering the significance of his other comorbidities, namely the recently diagnosed myelodysplastic syndrome (MDS).  It is felt that there is relatively high likelihood that the MDS could progress into an acute leukemia in the next 3-5 years as opposed to a low risk for any significant progression of his prostate cancer to symptomatic metastatic disease in the near future.  We discussed that while he is eligible for a variety of potential treatment options including brachytherapy or 5.5 weeks of external radiation, in his case, it is felt to be most appropriate to proceed with continued active surveillance to continue to monitor closely for any evidence of disease progression. We also feel that it is reasonable for him to resume TRT to maintain his QOL given his favorable, intermediate risk disease. We discussed that should his PSA continue to rise or if he develops symptoms concerning for disease progression, we would be more than happy to further discuss the options for radiotherapy. We also feel that it would be reasonable to reconsider curative treatment with prostate radiation in  the future should his performance status continue to improve with treatment of the MDS.  At the conclusion of our conversation, the patient and his daughter, Dr. Silas Sacramento, are in favor of proceeding in active surveillance.  They appear to have a good understanding of his disease and our recommendations and are comfortable and in agreement with the stated plan. His PSA will continue to be monitored by either Dr. Bernardo Heater or Dr. Alinda Money in Urology. If his performance status improves with treatment of MDS, and if his PSA continues to rise, we will re-consider treating with 5.5 weeks of prostate IMRT. We also agree that he can resume testosterone replacement therapy for quality of life purposes but at present, he is feeling quite well off of this, since starting treatment for the MDS so he elects to remain off of TRT for the time being. He will also continue in close follow up with Dr. Burr Medico for further management/treatment of the MDS and monitoring for any recurrence of malignant melanoma. Dr. Mickeal Skinner will remain involved as well, monitoring for any evidence of disease recurrence/progression in the brain secondary to his malignant melanoma with his next CT Head scan scheduled for 05/2019.  They know to call at any time with any questions or concerns related to radiotherapy.  Given current concerns for patient exposure during the COVID-19 pandemic, this encounter was conducted via telephone. The patient was notified in advance and was offered a MyChart meeting to allow for face to face communication but unfortunately reported that he did not have the appropriate resources/technology to support such a visit and instead preferred to proceed with telephone consult. The patient has given verbal consent for this type of encounter. The time spent during this encounter was 30 minutes. The attendants for this meeting include Tyler Pita MD, Andi Devon, Katie Daubenspeck- scribe, patient Juancarlos Crescenzo. and his daughter, Dr. Silas Sacramento. During the encounter, Tyler Pita MD, Ashlyn Bruning PA-C, and scribe, Wilburn Mylar were located at [place].  Patient Morey Andonian.  was located at home.    Nicholos Johns, PA-C    Tyler Pita, MD  [facility] Direct Dial: [phone]   Fax: [phone] [email or web address]   Skype   LinkedIn  This document serves as a record of services personally performed by Tyler Pita, MD and Freeman Caldron, PA-C. It was created on their behalf by Wilburn Mylar, a trained medical scribe. The creation of this record is based on the  scribe's personal observations and the provider's statements to them. This document has been checked and approved by the attending provider.     ADDENDUM:  The final pathology from his bone marrow biopsy was completed later on this same date revealing Acute Promyelocytic Leukemia (APL).  This confirms the concept that his prostate cancer can be observed for now.  He is set up to go to [place] next week.

## 2019-01-17 NOTE — Progress Notes (Signed)
[facility] Spiritual Care Note  Because David [name] joined [facility] by phone, I mailed him a handwritten note of introduction with our full [facility] team/programming packet.  Please always page if additional needs arise. Thank you!   [place], [place], [facility] Pager [phone] Voicemail [phone]

## 2019-01-17 NOTE — Telephone Encounter (Signed)
Referral--Dr. Alen Blew and Dr. Ernestina Penna last office notes, and lab/pathology reports--faxed to Dr. Burley Saver office at [facility] [phone]). Received confirmation that fax went through successfully.

## 2019-01-17 NOTE — Telephone Encounter (Signed)
[facility] Night - Client TELEPHONE ADVICE RECORD [facility] Patient Name: David [name] Gender: Male DOB: 20-Sep-1940 Age: 78 Y 83 M 18 D Return Phone Number: Address: City/State/Zip: [place] Client [place] Night - Client Client Site [place] Physician [name] - MD Contact Type Call Who Is Calling Lab Lab Name [facility] Phone Number [phone] Lab Tech Name Santiago Glad Lab Reference Number Chief Complaint Lab Result (Critical or Stat) Call Type Lab Send to RN Reason for Call Report lab results Initial Comment Caller has a critical lab to report. MRN: [id number]. If it is after 6 call Santiago Glad at [phone] Additional Comment Translation No Nurse Assessment Nurse: London Pepper, RN, Jeneen Rinks Date/Time Eilene Ghazi Time): 01/16/2019 5:28:52 PM Is there an on-call provider listed? ---Yes Please list name of person reporting value (Lab Employee) and a contact number. ---Hetty Blend Calling [place] laboratory. Please document the following items: Lab name Lab value (read back to lab to verify) Reference range for lab value Date and time blood was drawn Collect time of birth for bilirubin results ---Crit WBC of 1.1 Drawn at 1:07 pm today Pt was 0.7 on 01/02/2019 and a 1.7 on 8/7 Please collect the patient contact information from the lab. (name, phone number and address) ---[name] [phone] Guidelines Guideline Title Affirmed Question Affirmed Notes Nurse Date/Time Eilene Ghazi Time) Disp. Time Eilene Ghazi Time) Disposition Final User 01/16/2019 5:29:32 PM Lab Call London Pepper, RN, Jeneen Rinks Reason: Silvano Bilis to Receive critical. 01/16/2019 5:33:01 PM Called On-Call Provider London Pepper, RN, Jeneen Rinks 01/16/2019 5:34:03 PM Clinical Call Yes London Pepper, RN, Joanne Gavel NOTE: All timestamps contained within this report are represented as [place] Standard Time. CONFIDENTIALTY NOTICE: This fax transmission  is intended only for the addressee. It contains information that is legally privileged, confidential or otherwise protected from use or disclosure. If you are not the intended recipient, you are strictly prohibited from reviewing, disclosing, copying using or disseminating any of this information or taking any action in reliance on or regarding this information. If you have received this fax in error, please notify us immediately by telephone so that we can arrange for its return to [place]. Phone: [phone], Toll-Free: [phone], Fax: [phone] Page: 2 of 2 Call Id: [id number] Paging DoctorName Phone DateTime Result/Outcome Message Type Notes Thersa Salt- MD [phone] 01/16/2019 5:33:01 PM Called On Call Provider - Reached Doctor Paged Thersa Salt- MD 01/16/2019 5:33:48 PM Spoke with On Call - General Message Result Called oncall to report critical

## 2019-01-19 ENCOUNTER — Encounter: Payer: Self-pay | Admitting: Family Medicine

## 2019-01-20 ENCOUNTER — Telehealth: Payer: Self-pay

## 2019-01-20 ENCOUNTER — Inpatient Hospital Stay: Payer: Medicare Other

## 2019-01-20 DIAGNOSIS — E559 Vitamin D deficiency, unspecified: Secondary | ICD-10-CM | POA: Insufficient documentation

## 2019-01-20 NOTE — Assessment & Plan Note (Signed)
Reviewed with patient and daughter - they desire to stay at home as much as possible. He would want to receive non-invasive and supportive therapies for MDS but does not want aggressive treatment like chemo.

## 2019-01-20 NOTE — Assessment & Plan Note (Signed)
Upcoming tumor board appt later this week to review recent dx and management options.

## 2019-01-20 NOTE — Telephone Encounter (Signed)
Patient's daughter called back She gave her office number below for a call back David [name] stated that her assistant will answer the phone just let her know that this is an health issue and she will go get her out of a patient room      C/B # [id number]

## 2019-01-20 NOTE — Assessment & Plan Note (Signed)
crestor was discontinued in the hospital - he feels markedly better off crestor- will stay off.

## 2019-01-20 NOTE — Assessment & Plan Note (Signed)
Steroid related. Currently on glipizide 5mg  bid. Morning readings remain well controlled however has marked high readings in evenings. Will add novolog low dose sliding scale for post-dinner hyperglycemia. Anticipate insulin need will continue to diminish with prednisone taper.

## 2019-01-20 NOTE — Assessment & Plan Note (Signed)
On extended prednisone taper, with resolution of gout flare.  Has also been started on allopurinol 200mg  daily. Caution with chronic kidney disease. He also uses colchicine PRN

## 2019-01-20 NOTE — Assessment & Plan Note (Signed)
Presumed pancytopenia related. Plans to receive neupogen later this week through onc. Update CBC.

## 2019-01-20 NOTE — Assessment & Plan Note (Signed)
Update TFTs on levothyroxine 120mcg daily.

## 2019-01-20 NOTE — Telephone Encounter (Signed)
Spoke with patient and he asked to call [name] his daughter. Left message for [name] to call back

## 2019-01-20 NOTE — Assessment & Plan Note (Signed)
Update renal function.

## 2019-01-20 NOTE — Assessment & Plan Note (Signed)
Fatigue with weakness - stable period on ritalin 5mg  bid.

## 2019-01-20 NOTE — Assessment & Plan Note (Signed)
Update labs.  

## 2019-01-20 NOTE — Assessment & Plan Note (Signed)
Complicated history. Recent diagnosis. Also h/o prostate cancer, and h/o metastatic melanoma. Appreciate onc care.

## 2019-01-20 NOTE — Telephone Encounter (Signed)
Spoke with [name], see lab result note.

## 2019-01-21 DIAGNOSIS — N179 Acute kidney failure, unspecified: Secondary | ICD-10-CM | POA: Diagnosis not present

## 2019-01-21 DIAGNOSIS — M109 Gout, unspecified: Secondary | ICD-10-CM | POA: Diagnosis not present

## 2019-01-21 DIAGNOSIS — Z7984 Long term (current) use of oral hypoglycemic drugs: Secondary | ICD-10-CM | POA: Diagnosis not present

## 2019-01-21 DIAGNOSIS — E1165 Type 2 diabetes mellitus with hyperglycemia: Secondary | ICD-10-CM | POA: Diagnosis not present

## 2019-01-21 DIAGNOSIS — Z7982 Long term (current) use of aspirin: Secondary | ICD-10-CM | POA: Diagnosis not present

## 2019-01-21 DIAGNOSIS — R5381 Other malaise: Secondary | ICD-10-CM | POA: Diagnosis not present

## 2019-01-22 ENCOUNTER — Other Ambulatory Visit: Payer: Self-pay | Admitting: Hematology

## 2019-01-22 DIAGNOSIS — R112 Nausea with vomiting, unspecified: Secondary | ICD-10-CM | POA: Diagnosis not present

## 2019-01-22 DIAGNOSIS — D61810 Antineoplastic chemotherapy induced pancytopenia: Secondary | ICD-10-CM | POA: Diagnosis not present

## 2019-01-22 DIAGNOSIS — N179 Acute kidney failure, unspecified: Secondary | ICD-10-CM | POA: Diagnosis not present

## 2019-01-22 DIAGNOSIS — C9241 Acute promyelocytic leukemia, in remission: Secondary | ICD-10-CM | POA: Diagnosis not present

## 2019-01-22 DIAGNOSIS — T451X5A Adverse effect of antineoplastic and immunosuppressive drugs, initial encounter: Secondary | ICD-10-CM | POA: Diagnosis not present

## 2019-01-22 DIAGNOSIS — I252 Old myocardial infarction: Secondary | ICD-10-CM | POA: Diagnosis not present

## 2019-01-22 DIAGNOSIS — E1165 Type 2 diabetes mellitus with hyperglycemia: Secondary | ICD-10-CM | POA: Diagnosis not present

## 2019-01-22 DIAGNOSIS — M109 Gout, unspecified: Secondary | ICD-10-CM | POA: Diagnosis not present

## 2019-01-22 DIAGNOSIS — I2510 Atherosclerotic heart disease of native coronary artery without angina pectoris: Secondary | ICD-10-CM | POA: Diagnosis present

## 2019-01-22 DIAGNOSIS — Z85820 Personal history of malignant melanoma of skin: Secondary | ICD-10-CM | POA: Diagnosis not present

## 2019-01-22 DIAGNOSIS — I10 Essential (primary) hypertension: Secondary | ICD-10-CM | POA: Diagnosis present

## 2019-01-22 DIAGNOSIS — M10071 Idiopathic gout, right ankle and foot: Secondary | ICD-10-CM | POA: Diagnosis present

## 2019-01-22 DIAGNOSIS — I361 Nonrheumatic tricuspid (valve) insufficiency: Secondary | ICD-10-CM | POA: Diagnosis not present

## 2019-01-22 DIAGNOSIS — Z7982 Long term (current) use of aspirin: Secondary | ICD-10-CM | POA: Diagnosis not present

## 2019-01-22 DIAGNOSIS — Z85841 Personal history of malignant neoplasm of brain: Secondary | ICD-10-CM | POA: Diagnosis not present

## 2019-01-22 DIAGNOSIS — C61 Malignant neoplasm of prostate: Secondary | ICD-10-CM | POA: Diagnosis present

## 2019-01-22 DIAGNOSIS — R21 Rash and other nonspecific skin eruption: Secondary | ICD-10-CM | POA: Diagnosis not present

## 2019-01-22 DIAGNOSIS — Z95810 Presence of automatic (implantable) cardiac defibrillator: Secondary | ICD-10-CM | POA: Diagnosis not present

## 2019-01-22 DIAGNOSIS — F329 Major depressive disorder, single episode, unspecified: Secondary | ICD-10-CM | POA: Diagnosis present

## 2019-01-22 DIAGNOSIS — G4733 Obstructive sleep apnea (adult) (pediatric): Secondary | ICD-10-CM | POA: Diagnosis present

## 2019-01-22 DIAGNOSIS — Z452 Encounter for adjustment and management of vascular access device: Secondary | ICD-10-CM | POA: Diagnosis not present

## 2019-01-22 DIAGNOSIS — E871 Hypo-osmolality and hyponatremia: Secondary | ICD-10-CM | POA: Diagnosis not present

## 2019-01-22 DIAGNOSIS — N029 Recurrent and persistent hematuria with unspecified morphologic changes: Secondary | ICD-10-CM | POA: Diagnosis present

## 2019-01-22 DIAGNOSIS — C9200 Acute myeloblastic leukemia, not having achieved remission: Secondary | ICD-10-CM | POA: Diagnosis not present

## 2019-01-22 DIAGNOSIS — E785 Hyperlipidemia, unspecified: Secondary | ICD-10-CM | POA: Diagnosis present

## 2019-01-22 DIAGNOSIS — Z7984 Long term (current) use of oral hypoglycemic drugs: Secondary | ICD-10-CM | POA: Diagnosis not present

## 2019-01-22 DIAGNOSIS — Z951 Presence of aortocoronary bypass graft: Secondary | ICD-10-CM | POA: Diagnosis not present

## 2019-01-22 DIAGNOSIS — N183 Chronic kidney disease, stage 3 unspecified: Secondary | ICD-10-CM | POA: Diagnosis present

## 2019-01-22 DIAGNOSIS — T504X5A Adverse effect of drugs affecting uric acid metabolism, initial encounter: Secondary | ICD-10-CM | POA: Diagnosis not present

## 2019-01-22 DIAGNOSIS — Z20828 Contact with and (suspected) exposure to other viral communicable diseases: Secondary | ICD-10-CM | POA: Diagnosis present

## 2019-01-22 DIAGNOSIS — C9240 Acute promyelocytic leukemia, not having achieved remission: Secondary | ICD-10-CM | POA: Diagnosis present

## 2019-01-22 DIAGNOSIS — R5381 Other malaise: Secondary | ICD-10-CM | POA: Diagnosis not present

## 2019-01-22 DIAGNOSIS — T82594A Other mechanical complication of infusion catheter, initial encounter: Secondary | ICD-10-CM | POA: Diagnosis not present

## 2019-01-22 DIAGNOSIS — D61818 Other pancytopenia: Secondary | ICD-10-CM | POA: Diagnosis present

## 2019-01-22 DIAGNOSIS — R4182 Altered mental status, unspecified: Secondary | ICD-10-CM | POA: Diagnosis not present

## 2019-01-22 DIAGNOSIS — Z9189 Other specified personal risk factors, not elsewhere classified: Secondary | ICD-10-CM | POA: Diagnosis not present

## 2019-01-22 DIAGNOSIS — R9431 Abnormal electrocardiogram [ECG] [EKG]: Secondary | ICD-10-CM | POA: Diagnosis not present

## 2019-01-22 DIAGNOSIS — Z8673 Personal history of transient ischemic attack (TIA), and cerebral infarction without residual deficits: Secondary | ICD-10-CM | POA: Diagnosis not present

## 2019-01-22 DIAGNOSIS — Z66 Do not resuscitate: Secondary | ICD-10-CM | POA: Diagnosis not present

## 2019-01-22 DIAGNOSIS — I442 Atrioventricular block, complete: Secondary | ICD-10-CM | POA: Diagnosis present

## 2019-01-22 DIAGNOSIS — K117 Disturbances of salivary secretion: Secondary | ICD-10-CM | POA: Diagnosis not present

## 2019-01-22 DIAGNOSIS — R42 Dizziness and giddiness: Secondary | ICD-10-CM | POA: Diagnosis not present

## 2019-01-22 DIAGNOSIS — R6889 Other general symptoms and signs: Secondary | ICD-10-CM | POA: Diagnosis not present

## 2019-01-22 NOTE — Telephone Encounter (Signed)
noted 

## 2019-01-24 DIAGNOSIS — C9240 Acute promyelocytic leukemia, not having achieved remission: Secondary | ICD-10-CM | POA: Insufficient documentation

## 2019-01-24 MED ORDER — ACYCLOVIR 200 MG PO CAPS
400.00 | ORAL_CAPSULE | ORAL | Status: DC
Start: 2019-02-12 — End: 2019-01-24

## 2019-01-24 MED ORDER — DEXTROSE 50 % IV SOLN
12.50 | INTRAVENOUS | Status: DC
Start: ? — End: 2019-01-24

## 2019-01-24 MED ORDER — ALLOPURINOL 100 MG PO TABS
200.00 | ORAL_TABLET | ORAL | Status: DC
Start: 2019-01-25 — End: 2019-01-24

## 2019-01-24 MED ORDER — SODIUM CHLORIDE 0.9 % IV SOLN
INTRAVENOUS | Status: DC
Start: ? — End: 2019-01-24

## 2019-01-24 MED ORDER — GENERIC EXTERNAL MEDICATION
137.00 | Status: DC
Start: 2019-03-05 — End: 2019-01-24

## 2019-01-24 MED ORDER — CITALOPRAM HYDROBROMIDE 20 MG PO TABS
10.00 | ORAL_TABLET | ORAL | Status: DC
Start: 2019-01-25 — End: 2019-01-24

## 2019-01-24 MED ORDER — LORAZEPAM 2 MG/ML IJ SOLN
0.50 | INTRAMUSCULAR | Status: DC
Start: ? — End: 2019-01-24

## 2019-01-24 MED ORDER — LORAZEPAM 0.5 MG PO TABS
0.50 | ORAL_TABLET | ORAL | Status: DC
Start: ? — End: 2019-01-24

## 2019-01-24 MED ORDER — LIDOCAINE HCL 1 % IJ SOLN
3.00 | INTRAMUSCULAR | Status: DC
Start: ? — End: 2019-01-24

## 2019-01-24 MED ORDER — GLUCAGON HCL RDNA (DIAGNOSTIC) 1 MG IJ SOLR
1.00 | INTRAMUSCULAR | Status: DC
Start: ? — End: 2019-01-24

## 2019-01-24 MED ORDER — ONDANSETRON HCL 4 MG/2ML IJ SOLN
4.00 | INTRAMUSCULAR | Status: DC
Start: ? — End: 2019-01-24

## 2019-01-24 MED ORDER — ASPIRIN EC 81 MG PO TBEC
81.00 | DELAYED_RELEASE_TABLET | ORAL | Status: DC
Start: 2019-03-05 — End: 2019-01-24

## 2019-01-24 MED ORDER — ONDANSETRON HCL 4 MG PO TABS
16.00 | ORAL_TABLET | ORAL | Status: DC
Start: 2019-01-24 — End: 2019-01-24

## 2019-01-24 MED ORDER — PROCHLORPERAZINE MALEATE 10 MG PO TABS
5.00 | ORAL_TABLET | ORAL | Status: DC
Start: ? — End: 2019-01-24

## 2019-01-24 MED ORDER — LIDOCAINE HCL 1 % IJ SOLN
0.50 | INTRAMUSCULAR | Status: DC
Start: ? — End: 2019-01-24

## 2019-01-24 MED ORDER — BUTALBITAL-APAP-CAFFEINE 50-325-40 MG PO TABS
1.00 | ORAL_TABLET | ORAL | Status: DC
Start: ? — End: 2019-01-24

## 2019-01-24 MED ORDER — METHYLPHENIDATE HCL 5 MG PO TABS
5.00 | ORAL_TABLET | ORAL | Status: DC
Start: 2019-03-05 — End: 2019-01-24

## 2019-01-24 MED ORDER — TRAZODONE HCL 50 MG PO TABS
25.00 | ORAL_TABLET | ORAL | Status: DC
Start: 2019-01-24 — End: 2019-01-24

## 2019-01-24 MED ORDER — GENERIC EXTERNAL MEDICATION
0.15 | Status: DC
Start: 2019-01-24 — End: 2019-01-24

## 2019-01-24 MED ORDER — GENERIC EXTERNAL MEDICATION
5.00 | Status: DC
Start: ? — End: 2019-01-24

## 2019-01-24 MED ORDER — INSULIN REGULAR HUMAN 100 UNIT/ML IJ SOLN
0.00 | INTRAMUSCULAR | Status: DC
Start: 2019-01-31 — End: 2019-01-24

## 2019-01-24 MED ORDER — POLYETHYLENE GLYCOL 3350 17 G PO PACK
17.00 | PACK | ORAL | Status: DC
Start: 2019-01-25 — End: 2019-01-24

## 2019-01-24 MED ORDER — GENERIC EXTERNAL MEDICATION
75.00 | Status: DC
Start: 2019-03-05 — End: 2019-01-24

## 2019-01-24 MED ORDER — TRETINOIN 10 MG PO CAPS
45.00 | ORAL_CAPSULE | ORAL | Status: DC
Start: 2019-02-12 — End: 2019-01-24

## 2019-01-24 MED ORDER — ACETAMINOPHEN 325 MG PO TABS
650.00 | ORAL_TABLET | ORAL | Status: DC
Start: ? — End: 2019-01-24

## 2019-01-26 DIAGNOSIS — I252 Old myocardial infarction: Secondary | ICD-10-CM | POA: Insufficient documentation

## 2019-01-26 DIAGNOSIS — R413 Other amnesia: Secondary | ICD-10-CM | POA: Insufficient documentation

## 2019-01-26 DIAGNOSIS — E119 Type 2 diabetes mellitus without complications: Secondary | ICD-10-CM | POA: Insufficient documentation

## 2019-01-27 DIAGNOSIS — Z7984 Long term (current) use of oral hypoglycemic drugs: Secondary | ICD-10-CM

## 2019-01-27 DIAGNOSIS — R5381 Other malaise: Secondary | ICD-10-CM | POA: Diagnosis not present

## 2019-01-27 DIAGNOSIS — N179 Acute kidney failure, unspecified: Secondary | ICD-10-CM | POA: Diagnosis not present

## 2019-01-27 DIAGNOSIS — M109 Gout, unspecified: Secondary | ICD-10-CM | POA: Diagnosis not present

## 2019-01-27 DIAGNOSIS — E1165 Type 2 diabetes mellitus with hyperglycemia: Secondary | ICD-10-CM | POA: Diagnosis not present

## 2019-01-27 DIAGNOSIS — Z7982 Long term (current) use of aspirin: Secondary | ICD-10-CM

## 2019-01-30 MED ORDER — POLYETHYLENE GLYCOL 3350 17 G PO PACK
17.00 | PACK | ORAL | Status: DC
Start: 2019-02-12 — End: 2019-01-30

## 2019-01-30 MED ORDER — SODIUM CHLORIDE 0.9 % IV SOLN
INTRAVENOUS | Status: DC
Start: ? — End: 2019-01-30

## 2019-01-30 MED ORDER — COLCHICINE 0.6 MG PO TABS
0.60 | ORAL_TABLET | ORAL | Status: DC
Start: 2019-02-12 — End: 2019-01-30

## 2019-01-30 MED ORDER — ONDANSETRON HCL 8 MG PO TABS
4.00 | ORAL_TABLET | ORAL | Status: DC
Start: 2019-02-12 — End: 2019-01-30

## 2019-01-30 MED ORDER — GENERIC EXTERNAL MEDICATION
100.00 | Status: DC
Start: 2019-02-02 — End: 2019-01-30

## 2019-01-30 MED ORDER — ALLOPURINOL 100 MG PO TABS
200.00 | ORAL_TABLET | ORAL | Status: DC
Start: 2019-03-05 — End: 2019-01-30

## 2019-01-30 MED ORDER — GENERIC EXTERNAL MEDICATION
30.00 | Status: DC
Start: 2019-02-01 — End: 2019-01-30

## 2019-01-30 MED ORDER — SENNOSIDES-DOCUSATE SODIUM 8.6-50 MG PO TABS
1.00 | ORAL_TABLET | ORAL | Status: DC
Start: 2019-03-05 — End: 2019-01-30

## 2019-01-30 MED ORDER — CITALOPRAM HYDROBROMIDE 10 MG PO TABS
10.00 | ORAL_TABLET | ORAL | Status: DC
Start: 2019-03-05 — End: 2019-01-30

## 2019-01-30 MED ORDER — MELATONIN 3 MG PO TABS
3.00 | ORAL_TABLET | ORAL | Status: DC
Start: 2019-01-30 — End: 2019-01-30

## 2019-01-30 MED ORDER — ACETAMINOPHEN 325 MG PO TABS
650.00 | ORAL_TABLET | ORAL | Status: DC
Start: ? — End: 2019-01-30

## 2019-01-30 MED ORDER — GENERIC EXTERNAL MEDICATION
0.15 | Status: DC
Start: 2019-02-12 — End: 2019-01-30

## 2019-01-30 MED ORDER — GENERIC EXTERNAL MEDICATION
3.38 | Status: DC
Start: 2019-02-01 — End: 2019-01-30

## 2019-01-30 MED ORDER — SEVELAMER CARBONATE 800 MG PO TABS
800.00 | ORAL_TABLET | ORAL | Status: DC
Start: 2019-02-01 — End: 2019-01-30

## 2019-01-31 MED ORDER — GENERIC EXTERNAL MEDICATION
Status: DC
Start: ? — End: 2019-01-31

## 2019-01-31 MED ORDER — TRAZODONE HCL 50 MG PO TABS
25.00 | ORAL_TABLET | ORAL | Status: DC
Start: 2019-02-12 — End: 2019-01-31

## 2019-02-05 MED ORDER — CIPROFLOXACIN HCL 500 MG PO TABS
500.00 | ORAL_TABLET | ORAL | Status: DC
Start: 2019-02-05 — End: 2019-02-05

## 2019-02-05 MED ORDER — GENERIC EXTERNAL MEDICATION
Status: DC
Start: ? — End: 2019-02-05

## 2019-02-07 DIAGNOSIS — M10071 Idiopathic gout, right ankle and foot: Secondary | ICD-10-CM | POA: Insufficient documentation

## 2019-02-07 DIAGNOSIS — Z8659 Personal history of other mental and behavioral disorders: Secondary | ICD-10-CM | POA: Insufficient documentation

## 2019-02-08 DIAGNOSIS — M7751 Other enthesopathy of right foot: Secondary | ICD-10-CM | POA: Insufficient documentation

## 2019-02-09 MED ORDER — PREDNISONE 5 MG PO TABS
5.00 | ORAL_TABLET | ORAL | Status: DC
Start: 2019-02-12 — End: 2019-02-09

## 2019-02-09 MED ORDER — PREDNISONE 5 MG PO TABS
2.50 | ORAL_TABLET | ORAL | Status: DC
Start: 2019-02-13 — End: 2019-02-09

## 2019-02-09 MED ORDER — GENERIC EXTERNAL MEDICATION
100.00 | Status: DC
Start: 2019-03-16 — End: 2019-02-09

## 2019-02-10 ENCOUNTER — Inpatient Hospital Stay: Payer: Medicare Other | Admitting: Nurse Practitioner

## 2019-02-10 ENCOUNTER — Inpatient Hospital Stay: Payer: Medicare Other

## 2019-02-11 MED ORDER — HYDROCORTISONE 1 % EX CREA
1.00 | TOPICAL_CREAM | CUTANEOUS | Status: DC
Start: 2019-02-12 — End: 2019-02-11

## 2019-02-11 MED ORDER — TRAMADOL HCL 50 MG PO TABS
25.00 | ORAL_TABLET | ORAL | Status: DC
Start: ? — End: 2019-02-11

## 2019-02-13 ENCOUNTER — Ambulatory Visit: Payer: Medicare Other | Admitting: Family Medicine

## 2019-02-14 ENCOUNTER — Ambulatory Visit: Payer: Medicare Other | Admitting: Family Medicine

## 2019-02-21 ENCOUNTER — Ambulatory Visit: Payer: Medicare Other | Admitting: Internal Medicine

## 2019-02-25 ENCOUNTER — Ambulatory Visit: Payer: Medicare Other | Admitting: Family Medicine

## 2019-02-25 ENCOUNTER — Telehealth: Payer: Self-pay

## 2019-02-25 NOTE — Telephone Encounter (Signed)
Noted.  Fyi to Dr. G.  

## 2019-02-25 NOTE — Telephone Encounter (Signed)
[place] Night - Client Nonclinical Telephone Record [facility] Client [place] Night - Client Client Site [place] Physician [name] - MD Contact Type Call Who Is Calling Patient / Member / Family / Caregiver Caller Name [place] Phone Number [phone] Patient Name David [name] Patient DOB Aug 22, 1940 Call Type Message Only Information Provided Reason for Call Request to [facility] Appointment Initial Comment Caller states calling to cancel an appointment for tomorrow. Additional Comment Nurse triage was offered & declined. Office hours were provided. Call Closed By: Shireen Quan Transaction Date/Time: 02/24/2019 5:33:33 PM (ET)

## 2019-02-25 NOTE — Telephone Encounter (Signed)
appt already cancelled and rescheduled for 03/05/19 for HFU.

## 2019-02-27 ENCOUNTER — Ambulatory Visit: Payer: Medicare Other | Admitting: Podiatry

## 2019-03-03 ENCOUNTER — Other Ambulatory Visit: Payer: Medicare Other

## 2019-03-03 ENCOUNTER — Ambulatory Visit: Payer: Medicare Other | Admitting: Hematology

## 2019-03-04 MED ORDER — AQUAPHOR EX OINT
TOPICAL_OINTMENT | CUTANEOUS | Status: DC
Start: ? — End: 2019-03-04

## 2019-03-04 MED ORDER — PRAMOXINE HCL (PERIANAL) 1 % EX FOAM
CUTANEOUS | Status: DC
Start: 2019-03-04 — End: 2019-03-04

## 2019-03-04 MED ORDER — MELATONIN 3 MG PO TABS
3.00 | ORAL_TABLET | ORAL | Status: DC
Start: 2019-03-04 — End: 2019-03-04

## 2019-03-04 MED ORDER — HYDROCORTISONE 1 % EX CREA
TOPICAL_CREAM | CUTANEOUS | Status: DC
Start: ? — End: 2019-03-04

## 2019-03-04 MED ORDER — DEXTROSE 50 % IV SOLN
12.50 | INTRAVENOUS | Status: DC
Start: ? — End: 2019-03-04

## 2019-03-04 MED ORDER — TRAZODONE HCL 50 MG PO TABS
25.00 | ORAL_TABLET | ORAL | Status: DC
Start: 2019-03-04 — End: 2019-03-04

## 2019-03-04 MED ORDER — HYDROCORTISONE 2.5 % EX CREA
TOPICAL_CREAM | CUTANEOUS | Status: DC
Start: 2019-03-04 — End: 2019-03-04

## 2019-03-04 MED ORDER — ACYCLOVIR 200 MG PO CAPS
400.00 | ORAL_CAPSULE | ORAL | Status: DC
Start: 2019-03-04 — End: 2019-03-04

## 2019-03-04 MED ORDER — GLUCAGON HCL RDNA (DIAGNOSTIC) 1 MG IJ SOLR
1.00 | INTRAMUSCULAR | Status: DC
Start: ? — End: 2019-03-04

## 2019-03-04 MED ORDER — CARBOXYMETHYLCELLULOSE SOD PF 0.5 % OP SOLN
1.00 | OPHTHALMIC | Status: DC
Start: 2019-03-04 — End: 2019-03-04

## 2019-03-04 MED ORDER — POLYETHYLENE GLYCOL 3350 17 G PO PACK
17.00 | PACK | ORAL | Status: DC
Start: ? — End: 2019-03-04

## 2019-03-04 MED ORDER — SALINE NASAL SPRAY 0.65 % NA SOLN
1.00 | NASAL | Status: DC
Start: 2019-03-04 — End: 2019-03-04

## 2019-03-04 MED ORDER — GENERIC EXTERNAL MEDICATION
25.00 | Status: DC
Start: 2019-03-05 — End: 2019-03-04

## 2019-03-04 MED ORDER — GENERIC EXTERNAL MEDICATION
Status: DC
Start: 2019-03-05 — End: 2019-03-04

## 2019-03-05 ENCOUNTER — Ambulatory Visit: Payer: Medicare Other | Admitting: Family Medicine

## 2019-03-05 ENCOUNTER — Ambulatory Visit (INDEPENDENT_AMBULATORY_CARE_PROVIDER_SITE_OTHER): Payer: Medicare Other | Admitting: *Deleted

## 2019-03-05 DIAGNOSIS — C969 Malignant neoplasm of lymphoid, hematopoietic and related tissue, unspecified: Secondary | ICD-10-CM | POA: Diagnosis not present

## 2019-03-05 DIAGNOSIS — E039 Hypothyroidism, unspecified: Secondary | ICD-10-CM | POA: Diagnosis not present

## 2019-03-05 DIAGNOSIS — K117 Disturbances of salivary secretion: Secondary | ICD-10-CM | POA: Diagnosis not present

## 2019-03-05 DIAGNOSIS — D630 Anemia in neoplastic disease: Secondary | ICD-10-CM | POA: Diagnosis not present

## 2019-03-05 DIAGNOSIS — Z48812 Encounter for surgical aftercare following surgery on the circulatory system: Secondary | ICD-10-CM | POA: Diagnosis not present

## 2019-03-05 DIAGNOSIS — N183 Chronic kidney disease, stage 3 unspecified: Secondary | ICD-10-CM | POA: Diagnosis not present

## 2019-03-05 DIAGNOSIS — M1030 Gout due to renal impairment, unspecified site: Secondary | ICD-10-CM | POA: Diagnosis not present

## 2019-03-05 DIAGNOSIS — M7751 Other enthesopathy of right foot: Secondary | ICD-10-CM | POA: Diagnosis not present

## 2019-03-05 DIAGNOSIS — I442 Atrioventricular block, complete: Secondary | ICD-10-CM

## 2019-03-05 DIAGNOSIS — G4733 Obstructive sleep apnea (adult) (pediatric): Secondary | ICD-10-CM | POA: Diagnosis not present

## 2019-03-05 DIAGNOSIS — C3490 Malignant neoplasm of unspecified part of unspecified bronchus or lung: Secondary | ICD-10-CM | POA: Diagnosis not present

## 2019-03-05 DIAGNOSIS — I252 Old myocardial infarction: Secondary | ICD-10-CM | POA: Diagnosis not present

## 2019-03-05 DIAGNOSIS — R21 Rash and other nonspecific skin eruption: Secondary | ICD-10-CM | POA: Diagnosis not present

## 2019-03-05 DIAGNOSIS — G4700 Insomnia, unspecified: Secondary | ICD-10-CM | POA: Diagnosis not present

## 2019-03-05 DIAGNOSIS — E875 Hyperkalemia: Secondary | ICD-10-CM | POA: Diagnosis not present

## 2019-03-05 DIAGNOSIS — I69120 Aphasia following nontraumatic intracerebral hemorrhage: Secondary | ICD-10-CM | POA: Diagnosis not present

## 2019-03-05 DIAGNOSIS — C7931 Secondary malignant neoplasm of brain: Secondary | ICD-10-CM | POA: Diagnosis not present

## 2019-03-05 DIAGNOSIS — D631 Anemia in chronic kidney disease: Secondary | ICD-10-CM | POA: Diagnosis not present

## 2019-03-05 DIAGNOSIS — H04123 Dry eye syndrome of bilateral lacrimal glands: Secondary | ICD-10-CM | POA: Diagnosis not present

## 2019-03-05 DIAGNOSIS — I69111 Memory deficit following nontraumatic intracerebral hemorrhage: Secondary | ICD-10-CM | POA: Diagnosis not present

## 2019-03-05 DIAGNOSIS — L84 Corns and callosities: Secondary | ICD-10-CM | POA: Diagnosis not present

## 2019-03-05 DIAGNOSIS — I129 Hypertensive chronic kidney disease with stage 1 through stage 4 chronic kidney disease, or unspecified chronic kidney disease: Secondary | ICD-10-CM | POA: Diagnosis not present

## 2019-03-05 DIAGNOSIS — C9240 Acute promyelocytic leukemia, not having achieved remission: Secondary | ICD-10-CM | POA: Diagnosis not present

## 2019-03-05 DIAGNOSIS — I2510 Atherosclerotic heart disease of native coronary artery without angina pectoris: Secondary | ICD-10-CM | POA: Diagnosis not present

## 2019-03-05 DIAGNOSIS — C61 Malignant neoplasm of prostate: Secondary | ICD-10-CM | POA: Diagnosis not present

## 2019-03-07 DIAGNOSIS — C61 Malignant neoplasm of prostate: Secondary | ICD-10-CM | POA: Diagnosis not present

## 2019-03-07 DIAGNOSIS — C969 Malignant neoplasm of lymphoid, hematopoietic and related tissue, unspecified: Secondary | ICD-10-CM | POA: Diagnosis not present

## 2019-03-07 DIAGNOSIS — C9240 Acute promyelocytic leukemia, not having achieved remission: Secondary | ICD-10-CM | POA: Diagnosis not present

## 2019-03-07 DIAGNOSIS — C3490 Malignant neoplasm of unspecified part of unspecified bronchus or lung: Secondary | ICD-10-CM | POA: Diagnosis not present

## 2019-03-07 DIAGNOSIS — C7931 Secondary malignant neoplasm of brain: Secondary | ICD-10-CM | POA: Diagnosis not present

## 2019-03-07 DIAGNOSIS — D630 Anemia in neoplastic disease: Secondary | ICD-10-CM | POA: Diagnosis not present

## 2019-03-08 LAB — CUP PACEART REMOTE DEVICE CHECK
Battery Remaining Longevity: 0 mo
Battery Voltage: 2.59 V
Brady Statistic AP VP Percent: 95 %
Brady Statistic AP VS Percent: 1 %
Brady Statistic AS VP Percent: 4.8 %
Brady Statistic AS VS Percent: 1 %
Brady Statistic RA Percent Paced: 95 %
Date Time Interrogation Session: 20201124194136
Implantable Lead Implant Date: 19980107
Implantable Lead Implant Date: 19980107
Implantable Lead Implant Date: 20141118
Implantable Lead Location: 753858
Implantable Lead Location: 753859
Implantable Lead Location: 753860
Implantable Pulse Generator Implant Date: 20141118
Lead Channel Impedance Value: 330 Ohm
Lead Channel Impedance Value: 360 Ohm
Lead Channel Impedance Value: 780 Ohm
Lead Channel Pacing Threshold Amplitude: 1.125 V
Lead Channel Pacing Threshold Amplitude: 1.25 V
Lead Channel Pacing Threshold Amplitude: 1.25 V
Lead Channel Pacing Threshold Pulse Width: 0.5 ms
Lead Channel Pacing Threshold Pulse Width: 0.8 ms
Lead Channel Pacing Threshold Pulse Width: 1 ms
Lead Channel Sensing Intrinsic Amplitude: 10.9 mV
Lead Channel Sensing Intrinsic Amplitude: 2.6 mV
Lead Channel Setting Pacing Amplitude: 2.125
Lead Channel Setting Pacing Amplitude: 2.5 V
Lead Channel Setting Pacing Amplitude: 2.5 V
Lead Channel Setting Pacing Pulse Width: 0.5 ms
Lead Channel Setting Pacing Pulse Width: 1 ms
Lead Channel Setting Sensing Sensitivity: 4 mV
Pulse Gen Model: 3242
Pulse Gen Serial Number: 7523334

## 2019-03-10 ENCOUNTER — Telehealth: Payer: Self-pay | Admitting: Family Medicine

## 2019-03-10 ENCOUNTER — Telehealth: Payer: Self-pay | Admitting: Emergency Medicine

## 2019-03-10 DIAGNOSIS — C7800 Secondary malignant neoplasm of unspecified lung: Secondary | ICD-10-CM

## 2019-03-10 DIAGNOSIS — C9240 Acute promyelocytic leukemia, not having achieved remission: Secondary | ICD-10-CM | POA: Diagnosis not present

## 2019-03-10 DIAGNOSIS — F331 Major depressive disorder, recurrent, moderate: Secondary | ICD-10-CM

## 2019-03-10 DIAGNOSIS — C969 Malignant neoplasm of lymphoid, hematopoietic and related tissue, unspecified: Secondary | ICD-10-CM | POA: Diagnosis not present

## 2019-03-10 DIAGNOSIS — C7931 Secondary malignant neoplasm of brain: Secondary | ICD-10-CM | POA: Diagnosis not present

## 2019-03-10 DIAGNOSIS — D630 Anemia in neoplastic disease: Secondary | ICD-10-CM | POA: Diagnosis not present

## 2019-03-10 DIAGNOSIS — C3490 Malignant neoplasm of unspecified part of unspecified bronchus or lung: Secondary | ICD-10-CM | POA: Diagnosis not present

## 2019-03-10 DIAGNOSIS — C61 Malignant neoplasm of prostate: Secondary | ICD-10-CM | POA: Diagnosis not present

## 2019-03-10 NOTE — Telephone Encounter (Signed)
Patient notified that PPM at [facility]. Reached  ERI 02/02/19 . Will have scheduler place patient on Dr Olin Pia schedule to discuss generator change.

## 2019-03-10 NOTE — Telephone Encounter (Signed)
Sent Referral and your last office note.

## 2019-03-10 NOTE — Telephone Encounter (Signed)
Received request for referral to psych for patient. Referral placed. He already has appt with Dr [name] today, appreciate her seeing him.

## 2019-03-11 DIAGNOSIS — C61 Malignant neoplasm of prostate: Secondary | ICD-10-CM | POA: Diagnosis not present

## 2019-03-11 DIAGNOSIS — C9240 Acute promyelocytic leukemia, not having achieved remission: Secondary | ICD-10-CM | POA: Diagnosis not present

## 2019-03-11 DIAGNOSIS — C7931 Secondary malignant neoplasm of brain: Secondary | ICD-10-CM | POA: Diagnosis not present

## 2019-03-11 DIAGNOSIS — C3490 Malignant neoplasm of unspecified part of unspecified bronchus or lung: Secondary | ICD-10-CM | POA: Diagnosis not present

## 2019-03-11 DIAGNOSIS — D630 Anemia in neoplastic disease: Secondary | ICD-10-CM | POA: Diagnosis not present

## 2019-03-11 DIAGNOSIS — C969 Malignant neoplasm of lymphoid, hematopoietic and related tissue, unspecified: Secondary | ICD-10-CM | POA: Diagnosis not present

## 2019-03-12 ENCOUNTER — Telehealth: Payer: Self-pay | Admitting: Family Medicine

## 2019-03-12 NOTE — Telephone Encounter (Signed)
Noted. Thanks.

## 2019-03-12 NOTE — Telephone Encounter (Signed)
David [name] @ [facility] called  Opened pt up for home health services Had 2 visit and she thinks that all the pt needs and daughter agrees

## 2019-03-13 DIAGNOSIS — D630 Anemia in neoplastic disease: Secondary | ICD-10-CM | POA: Diagnosis not present

## 2019-03-13 DIAGNOSIS — C61 Malignant neoplasm of prostate: Secondary | ICD-10-CM | POA: Diagnosis not present

## 2019-03-13 DIAGNOSIS — C9240 Acute promyelocytic leukemia, not having achieved remission: Secondary | ICD-10-CM | POA: Diagnosis not present

## 2019-03-13 DIAGNOSIS — C969 Malignant neoplasm of lymphoid, hematopoietic and related tissue, unspecified: Secondary | ICD-10-CM | POA: Diagnosis not present

## 2019-03-13 DIAGNOSIS — C7931 Secondary malignant neoplasm of brain: Secondary | ICD-10-CM | POA: Diagnosis not present

## 2019-03-13 DIAGNOSIS — C3490 Malignant neoplasm of unspecified part of unspecified bronchus or lung: Secondary | ICD-10-CM | POA: Diagnosis not present

## 2019-03-14 ENCOUNTER — Ambulatory Visit (INDEPENDENT_AMBULATORY_CARE_PROVIDER_SITE_OTHER): Payer: Medicare Other | Admitting: Family Medicine

## 2019-03-14 ENCOUNTER — Encounter: Payer: Self-pay | Admitting: Family Medicine

## 2019-03-14 ENCOUNTER — Other Ambulatory Visit: Payer: Self-pay

## 2019-03-14 ENCOUNTER — Telehealth: Payer: Self-pay | Admitting: Family Medicine

## 2019-03-14 VITALS — BP 116/72 | HR 79 | Temp 96.9°F | Wt 184.0 lb

## 2019-03-14 DIAGNOSIS — C439 Malignant melanoma of skin, unspecified: Secondary | ICD-10-CM

## 2019-03-14 DIAGNOSIS — I10 Essential (primary) hypertension: Secondary | ICD-10-CM

## 2019-03-14 DIAGNOSIS — R739 Hyperglycemia, unspecified: Secondary | ICD-10-CM

## 2019-03-14 DIAGNOSIS — G3189 Other specified degenerative diseases of nervous system: Secondary | ICD-10-CM

## 2019-03-14 DIAGNOSIS — Z95810 Presence of automatic (implantable) cardiac defibrillator: Secondary | ICD-10-CM

## 2019-03-14 DIAGNOSIS — F331 Major depressive disorder, recurrent, moderate: Secondary | ICD-10-CM

## 2019-03-14 DIAGNOSIS — M1039 Gout due to renal impairment, multiple sites: Secondary | ICD-10-CM

## 2019-03-14 DIAGNOSIS — Z66 Do not resuscitate: Secondary | ICD-10-CM

## 2019-03-14 DIAGNOSIS — Z950 Presence of cardiac pacemaker: Secondary | ICD-10-CM | POA: Diagnosis not present

## 2019-03-14 DIAGNOSIS — C7931 Secondary malignant neoplasm of brain: Secondary | ICD-10-CM

## 2019-03-14 DIAGNOSIS — C61 Malignant neoplasm of prostate: Secondary | ICD-10-CM | POA: Diagnosis not present

## 2019-03-14 DIAGNOSIS — F09 Unspecified mental disorder due to known physiological condition: Secondary | ICD-10-CM

## 2019-03-14 DIAGNOSIS — E440 Moderate protein-calorie malnutrition: Secondary | ICD-10-CM

## 2019-03-14 DIAGNOSIS — Z9989 Dependence on other enabling machines and devices: Secondary | ICD-10-CM

## 2019-03-14 DIAGNOSIS — E291 Testicular hypofunction: Secondary | ICD-10-CM

## 2019-03-14 DIAGNOSIS — E785 Hyperlipidemia, unspecified: Secondary | ICD-10-CM

## 2019-03-14 DIAGNOSIS — T380X5A Adverse effect of glucocorticoids and synthetic analogues, initial encounter: Secondary | ICD-10-CM

## 2019-03-14 DIAGNOSIS — E663 Overweight: Secondary | ICD-10-CM

## 2019-03-14 DIAGNOSIS — G4733 Obstructive sleep apnea (adult) (pediatric): Secondary | ICD-10-CM

## 2019-03-14 DIAGNOSIS — C9241 Acute promyelocytic leukemia, in remission: Secondary | ICD-10-CM

## 2019-03-14 DIAGNOSIS — D61818 Other pancytopenia: Secondary | ICD-10-CM

## 2019-03-14 DIAGNOSIS — N183 Chronic kidney disease, stage 3 unspecified: Secondary | ICD-10-CM

## 2019-03-14 DIAGNOSIS — C7800 Secondary malignant neoplasm of unspecified lung: Secondary | ICD-10-CM

## 2019-03-14 DIAGNOSIS — I255 Ischemic cardiomyopathy: Secondary | ICD-10-CM

## 2019-03-14 DIAGNOSIS — S069X9S Unspecified intracranial injury with loss of consciousness of unspecified duration, sequela: Secondary | ICD-10-CM

## 2019-03-14 DIAGNOSIS — Z7189 Other specified counseling: Secondary | ICD-10-CM

## 2019-03-14 LAB — GLUCOSE, POCT (MANUAL RESULT ENTRY): POC Glucose: 102 mg/dl — AB (ref 70–99)

## 2019-03-14 NOTE — Telephone Encounter (Signed)
Just received message from [name] that pt's [facility] appts have been cancelled for next week. I do want pt to have labwork sooner than several weeks away. Daughter agrees. Please call daughter to schedule lab visit for next week (non fasting) - I have ordered labs.

## 2019-03-14 NOTE — Telephone Encounter (Addendum)
I spoke with [name] about this at office visit today. Can we call and clarify with Diane? Don't want [place] discharge yet -  I do think pt needs continued [facility] PT care. Ok to stop OT. Would he benefit from SN or nurse aide once weekly for next few weeks?

## 2019-03-14 NOTE — Telephone Encounter (Signed)
Diane returned phone call. She states that they have an aide scheduled to go to the home next week (twice a week), and PT is continuing. She also stated that [facility] is involved with the patients care also.

## 2019-03-14 NOTE — Telephone Encounter (Signed)
Left detailed message on VM for David [name].

## 2019-03-14 NOTE — Telephone Encounter (Signed)
Noted! Thank you

## 2019-03-14 NOTE — Patient Instructions (Signed)
Good to see you today.  Continue current medicines.  MOST form filled out today.  Bring me copy of your advanced directives/health care power of attorney form at your convenience to update your chart.

## 2019-03-14 NOTE — Progress Notes (Signed)
This visit was conducted in person.  BP 116/72   Pulse 79   Temp (!) 96.9 F (36.1 C) (Temporal)   Wt 184 lb (83.5 kg)   SpO2 98%   BMI 26.78 kg/m    CC: hosp f/u visit Subjective:    Patient ID: David [name]., male    DOB: 1940/12/23, 78 y.o.   MRN: [id number]  HPI: David [name]. is a 78 y.o. male presenting on 03/14/2019 for Hospitalization Follow-up   Lives at [facility]. Here with daughter [name].   Recent diagnosis acute promyelocytic leukemia s/p prolonged hospitalization at [facility] for treatment with all trans retinoic acid (ATRA) + arsenic trioxide (ATO) started on 10/15-16/2020. Arsenic held a few days due to prolonged QTc. ATRA was temporarily stopped due to rash. Received 38 d total ATRA. He did receive intermittent steroids for gout flares while hospitalized. Colchicine was discontinued due to concern over myelosuppression. Planned prolonged prednisone taper:  25 mg x 6 days (11/25-11/30) 20 mg x 7 days (12/1-12/7) 15 mg x 7 days (12/8-12/14) 10 mg x 7 days (12/15-12/21) 5 mg x 7 days (12/22-12/28) 5 mg every other day (12/29-1/5) Treated with acyclovir ppx. Amlodipine was held due to normotensive readings. He continued aspirin and metoprolol. Was treated with sevelamer phosphate binder during hospitalization.   Latest labs: WBC 1.3, Hgb 9.6, plt 209, Cr 1.6, LFTs WNL, Alb 2.6, Cal 7.6 (corrected for albumin to 8.7)  Steroid induced hyperglycemia monitored with cbg's - was taken off insulin and off sulfonylurea.   Planned f/u with [name] onc next week. He did receive bone marrow biopsy prior to discharge - no evidence of atypical promyelocyte population.   To see psych Dr [name]. Was on celexa and trazodone.   Receiving [name]), I received message this was discontinued. Daughter unaware - will verify with HH. Still wants PT, and would benefit from NA weekly.   Advanced directive discussion: MOST form filled out.  He received  testosterone injections and ritalin during hospitalization, last injection in hospital 03/02/2019. Back on testosterone regularly, last shot at home was 03/12/2019.   Lab Results  Component Value Date   HGBA1C 7.4 (H) 12/16/2018    TCM hosp f/u phone call not performed.  Admit Date: 01/22/2019 Discharge Date: 03/04/2019 Admitting Physician: Loraine Grip, MD  Discharge Physician: Garth Schlatter, MD  Primary Care Provider: [name], MD  Admission Diagnoses:  APL (acute promyelocytic leukemia) (CMS-HCC) [C92.40]  Discharge Diagnoses:  Principal Problem: Acute promyelocytic leukemia not having achieved remission (CMS-HCC) Active Problems: CAD (coronary artery disease) Hx of CABG History of myocardial infarction Complete heart block (CMS-HCC) Pacemaker Chronic renal insufficiency, stage 3 (moderate) Intermittent memory loss Hypertension, essential Type 2 diabetes mellitus (CMS-HCC) Acute idiopathic gout of right ankle History of depression Tendonitis of ankle, right AKI (acute kidney injury) (CMS-HCC) Resolved Problems: Nausea & vomiting  Consult Orders: IP CONSULT TO WOUND MANAGEMENT IP CONSULT TO ORTHOTICS/PROSTHETICS IP CONSULT TO NUTRITION SERVICES   Procedures Performed:  1. Peripherally Inserted Central Catheter (PICC) on 01/23/19, line removed prior to discharge  2. Bone Marrow Biopsy on 01/23/19, and 03/04/19       Relevant past medical, surgical, family and social history reviewed and updated as indicated. Interim medical history since our last visit reviewed. Allergies and medications reviewed and updated. Outpatient Medications Prior to Visit  Medication Sig Dispense Refill  . acetaminophen (TYLENOL) 325 MG tablet Take 2 tablets (650 mg total) by mouth every  6 (six) hours as needed for mild pain (or Fever >/= 101).    Marland Kitchen acyclovir (ZOVIRAX) 200 MG capsule Take 2 capsules (400 mg total) by mouth 2 (two) times daily. Preventatively    .  allopurinol (ZYLOPRIM) 100 MG tablet Take 2 tablets (200 mg total) by mouth daily. 180 tablet 1  . aspirin EC 81 MG tablet Take 81 mg by mouth every evening.     . Blood Glucose Monitoring Suppl (ONETOUCH VERIO) w/Device KIT 1 kit by Does not apply route as directed. E11.65 1 kit 0  . Cholecalciferol (VITAMIN D3) 25 MCG (1000 UT) CAPS Take 1 capsule (1,000 Units total) by mouth daily. 30 capsule 0  . citalopram (CELEXA) 10 MG tablet Take 1 tablet (10 mg total) by mouth daily. 90 tablet 1  . glucose blood test strip E11.65 Use as instructed to check sugars three time daily and as needed 100 each 12  . insulin aspart (NOVOLOG FLEXPEN) 100 UNIT/ML FlexPen Use three times daily with meals as needed per sliding scale: 2 units for every 50 units over 150 (2u for 150-199, 4u for 200-249, 6u for 250-299, etc) 3 mL 1  . Lancets (ONETOUCH ULTRASOFT) lancets E11.65 Use as instructed to check sugars three time daily and as needed 100 each 12  . levothyroxine (SYNTHROID) 137 MCG tablet TAKE 1 TABLET BY MOUTH ONCE DAILY BEFORE BREAKFAST (Patient taking differently: Take 137 mcg by mouth daily before breakfast. ) 30 tablet 11  . methylphenidate (RITALIN) 5 MG tablet Take 1 tablet (5 mg total) by mouth 2 (two) times daily with breakfast and lunch. 60 tablet 0  . metoprolol succinate (TOPROL-XL) 50 MG 24 hr tablet TAKE 1 AND 1/2 TABLET (75 MG) BY MOUTH EVERY DAY WITH OR IMMEDIATELY FOLLOWING A MEAL *DO NOT CRUSH* (Patient taking differently: Take 75 mg by mouth daily. ) 50 tablet 11  . polyethylene glycol (MIRALAX / GLYCOLAX) 17 g packet Take 17 g by mouth 2 (two) times daily. (Patient taking differently: Take 17 g by mouth daily. ) 14 each 0  . predniSONE (DELTASONE) 5 MG tablet Take by mouth.    . senna (SENOKOT) 8.6 MG TABS tablet Take 1 tablet (8.6 mg total) by mouth at bedtime. 120 tablet 0  . testosterone cypionate (DEPOTESTOSTERONE CYPIONATE) 200 MG/ML injection Inject 100 mg into the muscle every 14 (fourteen)  days.    . traZODone (DESYREL) 50 MG tablet Take 0.5-1 tablets (25-50 mg total) by mouth at bedtime. (Patient taking differently: Take 25 mg by mouth at bedtime as needed for sleep. ) 30 tablet 1  . amLODipine (NORVASC) 5 MG tablet Take 1 tablet (5 mg total) by mouth daily. 32 tablet 11  . colchicine 0.6 MG tablet Take 1 tablet (0.6 mg total) by mouth daily. 30 tablet 0  . glipiZIDE (GLUCOTROL) 5 MG tablet Take 1 tablet (5 mg total) by mouth 2 (two) times daily before a meal. 60 tablet 0   Facility-Administered Medications Prior to Visit  Medication Dose Route Frequency Provider Last Rate Last Dose  . 0.9 %  sodium chloride infusion  1,000 mL Intravenous Once [name], MD         Per HPI unless specifically indicated in ROS section below Review of Systems Objective:    BP 116/72   Pulse 79   Temp (!) 96.9 F (36.1 C) (Temporal)   Wt 184 lb (83.5 kg)   SpO2 98%   BMI 26.78 kg/m   Wt Readings from Last  3 Encounters:  03/14/19 184 lb (83.5 kg)  01/16/19 200 lb 3 oz (90.8 kg)  01/03/19 199 lb 15.3 oz (90.7 kg)    Physical Exam Vitals signs and nursing note reviewed.  Constitutional:      General: He is not in acute distress.    Appearance: Normal appearance. He is not ill-appearing.     Comments: Frail appearing  Eyes:     Extraocular Movements: Extraocular movements intact.     Pupils: Pupils are equal, round, and reactive to light.  Neck:     Thyroid: No thyromegaly or thyroid tenderness.  Cardiovascular:     Rate and Rhythm: Normal rate and regular rhythm.     Pulses: Normal pulses.     Heart sounds: Normal heart sounds. No murmur.  Pulmonary:     Effort: Pulmonary effort is normal. No respiratory distress.     Breath sounds: Normal breath sounds. No wheezing, rhonchi or rales.  Genitourinary:    Rectum: External hemorrhoid (non inflamed) present.  Musculoskeletal:     Right lower leg: No edema.     Left lower leg: No edema.  Lymphadenopathy:     Cervical: No  cervical adenopathy.  Skin:    General: Skin is warm and dry.     Findings: No rash.  Neurological:     Mental Status: He is alert.       Results for orders placed or performed in visit on 03/14/19  Glucose (CBG)  Result Value Ref Range   POC Glucose 102 (A) 70 - 99 mg/dl    Lab Results  Component Value Date   TSH 2.17 01/16/2019    Lab Results  Component Value Date   CREATININE 1.37 01/16/2019   BUN 75 (H) 01/16/2019   NA 132 (L) 01/16/2019   K 4.7 01/16/2019   CL 99 01/16/2019   CO2 24 01/16/2019    Assessment & Plan:  This visit occurred during the SARS-CoV-2 public health emergency.  Safety protocols were in place, including screening questions prior to the visit, additional usage of staff PPE, and extensive cleaning of exam room while observing appropriate contact time as indicated for disinfecting solutions.   Problem List Items Addressed This Visit    Steroid-induced hyperglycemia    Levels while hospitalized largely normalized even on prednisone - now off glipizide and insulin - check random cbg today = 102. Will continue to monitor off medication. He is continuing slow prednisone taper, currently on 26m daily.       Relevant Orders   Glucose (CBG) (Completed)   Prostate cancer ([place]    Appreciate uro care (Alinda Money - planned active surveillance given other comorbidities.       Relevant Medications   acyclovir (ZOVIRAX) 200 MG capsule   predniSONE (DELTASONE) 5 MG tablet   Presence of biventricular cardiac pacemaker   Pancytopenia ([place]    Related to recent APML and its treatment. Will need updated CBC anticipate Monday.       Overweight with body mass index (BMI) 25.0-29.9    10+ lb weight loss in the last 2 months. Reviewed importance of good nutrition. See below.       OSA on CPAP    Continues CPAP - some issues with machine, daughter will investigate this      Metastatic melanoma to lung ([facility]   Relevant Medications   acyclovir (ZOVIRAX) 200 MG  capsule   predniSONE (DELTASONE) 5 MG tablet   Metastasis to brain ([facility]   Relevant Medications  acyclovir (ZOVIRAX) 200 MG capsule   predniSONE (DELTASONE) 5 MG tablet   MDD (major depressive disorder), recurrent episode, moderate (HCC)    Prior on sertraline 131m daily - this was changed earlier this year to celexa 120mdaily due to worsening mood. Consider titration. Pt will f/u with Dr KaNicolasa DuckingHe continues trazodone nightly for sleep with benefit.       Mal[place] of moderate degree ([place]   As evidenced by recent low albumin - reviewed recommendations to improve nutrition. They will restart protein shakes they have at home.       Malignant melanoma (HCC)   Relevant Medications   acyclovir (ZOVIRAX) 200 MG capsule   predniSONE (DELTASONE) 5 MG tablet   Hypogonadism in male    Back on testosterone shots with benefit. Family has reviewed risks of this with uro but they feel these are outweighed.       Dyslipidemia    crestor discontinued during hospitalization 01/2019       DNR (do not resuscitate)   Cognitive and neurobehavioral dysfunction following brain injury ([facility]   Continues ritalin.       Chronic kidney disease, stage III (moderate)    Will need this updated at next labs.       Biventricular ICD (implantable cardioverter-defibrillator) in place   Benign essential HTN    Stable period only on metoprolol XL (now off amlodipine).      APML (acute promyelocytic leukemia) in remission ([place] [place]    Reviewed recent [facility] hospitalization with patient and daughter where he completed induction chemotherapy with ATRA (vit A) and ATO (arsenic) for ~6 wks. Latest bone marrow biopsy largely reassuring. Has f/u next week with [facility] onc - anticipate labwork at that time so will defer today.       Relevant Medications   acyclovir (ZOVIRAX) 200 MG capsule   predniSONE (DELTASONE) 5 MG tablet   Advanced care planning/counseling discussion    Reviewed with patient and  daughter - he desires DNR. Has advanced directives at home. Asked to [place] copy. Daughter is HCPOA. He would want son to help as well. MOST form filled out and provided original to patient, copy to be scanned.       Acute gout due to renal impairment involving multiple sites    Multiple recent gout flares significantly affecting quality of life.  Colchicine was held due to ?myelosuppression. This could also have been due to chemo.  Continues allopurinol 20036maily with latest urate <6. Update urate next labs, continue slow prednisone taper.           No orders of the defined types were placed in this encounter.  Orders Placed This Encounter  Procedures  . Glucose (CBG)    Patient Instructions  Good to see you today.  Continue current medicines.  MOST form filled out today.  Bring me copy of your advanced directives/health care power of attorney form at your convenience to update your chart.    Follow up plan: No follow-ups on file.  JavRia BushD

## 2019-03-15 ENCOUNTER — Encounter: Payer: Self-pay | Admitting: Family Medicine

## 2019-03-15 DIAGNOSIS — E440 Moderate protein-calorie malnutrition: Secondary | ICD-10-CM | POA: Insufficient documentation

## 2019-03-15 DIAGNOSIS — Z950 Presence of cardiac pacemaker: Secondary | ICD-10-CM | POA: Insufficient documentation

## 2019-03-15 DIAGNOSIS — Z66 Do not resuscitate: Secondary | ICD-10-CM | POA: Insufficient documentation

## 2019-03-15 NOTE — Assessment & Plan Note (Deleted)
Stable period only on metoprolol XL (now off amlodipine).

## 2019-03-15 NOTE — Assessment & Plan Note (Addendum)
Prior on sertraline 100mg  daily - this was changed earlier this year to celexa 10mg  daily due to worsening mood. Consider titration. Pt will f/u with Dr [name]. He continues trazodone nightly for sleep with benefit.

## 2019-03-15 NOTE — Assessment & Plan Note (Signed)
10+ lb weight loss in the last 2 months. Reviewed importance of good nutrition. See below.

## 2019-03-15 NOTE — Assessment & Plan Note (Addendum)
Reviewed recent [facility] hospitalization with patient and daughter where he completed induction chemotherapy with ATRA (vit A) and ATO (arsenic) for ~6 wks. Latest bone marrow biopsy largely reassuring. Has f/u next week with [facility] onc - anticipate labwork at that time so will defer today.

## 2019-03-15 NOTE — Assessment & Plan Note (Signed)
Stable period only on metoprolol XL (now off amlodipine).

## 2019-03-15 NOTE — Assessment & Plan Note (Signed)
crestor discontinued during hospitalization 01/2019

## 2019-03-15 NOTE — Assessment & Plan Note (Signed)
As evidenced by recent low albumin - reviewed recommendations to improve nutrition. They will restart protein shakes they have at home.

## 2019-03-15 NOTE — Assessment & Plan Note (Signed)
Levels while hospitalized largely normalized even on prednisone - now off glipizide and insulin - check random cbg today = 102. Will continue to monitor off medication. He is continuing slow prednisone taper, currently on 20mg  daily.

## 2019-03-15 NOTE — Assessment & Plan Note (Addendum)
Continues CPAP - some issues with machine, daughter will investigate this

## 2019-03-15 NOTE — Assessment & Plan Note (Addendum)
Appreciate uro care Alinda Money) - planned active surveillance given other comorbidities.

## 2019-03-15 NOTE — Assessment & Plan Note (Signed)
Will need this updated at next labs.

## 2019-03-15 NOTE — Assessment & Plan Note (Signed)
Reviewed with patient and daughter - he desires DNR. Has advanced directives at home. Asked to bring [place] a copy. Daughter is HCPOA. He would want son to help as well. MOST form filled out and provided original to patient, copy to be scanned.

## 2019-03-15 NOTE — Assessment & Plan Note (Signed)
Continues ritalin.

## 2019-03-15 NOTE — Assessment & Plan Note (Signed)
Related to recent APML and its treatment. Will need updated CBC anticipate Monday.

## 2019-03-15 NOTE — Assessment & Plan Note (Signed)
Multiple recent gout flares significantly affecting quality of life.  Colchicine was held due to ?myelosuppression. This could also have been due to chemo.  Continues allopurinol 200mg  daily with latest urate <6. Update urate next labs, continue slow prednisone taper.

## 2019-03-15 NOTE — Assessment & Plan Note (Addendum)
Back on testosterone shots with benefit. Family has reviewed risks of this with uro but they feel these are outweighed.

## 2019-03-17 ENCOUNTER — Other Ambulatory Visit: Payer: Self-pay

## 2019-03-17 ENCOUNTER — Other Ambulatory Visit (INDEPENDENT_AMBULATORY_CARE_PROVIDER_SITE_OTHER): Payer: Medicare Other

## 2019-03-17 DIAGNOSIS — R739 Hyperglycemia, unspecified: Secondary | ICD-10-CM | POA: Diagnosis not present

## 2019-03-17 DIAGNOSIS — T380X5A Adverse effect of glucocorticoids and synthetic analogues, initial encounter: Secondary | ICD-10-CM | POA: Diagnosis not present

## 2019-03-17 DIAGNOSIS — C439 Malignant melanoma of skin, unspecified: Secondary | ICD-10-CM

## 2019-03-17 DIAGNOSIS — M1039 Gout due to renal impairment, multiple sites: Secondary | ICD-10-CM | POA: Diagnosis not present

## 2019-03-17 LAB — COMPREHENSIVE METABOLIC PANEL
ALT: 11 U/L (ref 0–53)
AST: 13 U/L (ref 0–37)
Albumin: 3.2 g/dL — ABNORMAL LOW (ref 3.5–5.2)
Alkaline Phosphatase: 73 U/L (ref 39–117)
BUN: 31 mg/dL — ABNORMAL HIGH (ref 6–23)
CO2: 25 mEq/L (ref 19–32)
Calcium: 8.5 mg/dL (ref 8.4–10.5)
Chloride: 108 mEq/L (ref 96–112)
Creatinine, Ser: 1.48 mg/dL (ref 0.40–1.50)
GFR: 45.77 mL/min — ABNORMAL LOW (ref >60.00)
Glucose, Bld: 152 mg/dL — ABNORMAL HIGH (ref 70–99)
Potassium: 4.4 mEq/L (ref 3.5–5.1)
Sodium: 140 mEq/L (ref 135–145)
Total Bilirubin: 0.5 mg/dL (ref 0.2–1.2)
Total Protein: 5.1 g/dL — ABNORMAL LOW (ref 6.0–8.3)

## 2019-03-17 LAB — CBC WITH DIFFERENTIAL/PLATELET
Basophils Absolute: 0 10*3/uL (ref 0.0–0.1)
Basophils Relative: 0.5 % (ref 0.0–3.0)
Eosinophils Absolute: 0.3 10*3/uL (ref 0.0–0.7)
Eosinophils Relative: 5.3 % — ABNORMAL HIGH (ref 0.0–5.0)
HCT: 33.8 % — ABNORMAL LOW (ref 39.0–52.0)
Hemoglobin: 11 g/dL — ABNORMAL LOW (ref 13.0–17.0)
Lymphocytes Relative: 5.4 % — ABNORMAL LOW (ref 12.0–46.0)
Lymphs Abs: 0.3 10*3/uL — ABNORMAL LOW (ref 0.7–4.0)
MCHC: 32.7 g/dL (ref 30.0–36.0)
MCV: 101.5 fl — ABNORMAL HIGH (ref 78.0–100.0)
Monocytes Absolute: 0.4 10*3/uL (ref 0.1–1.0)
Monocytes Relative: 6.9 % (ref 3.0–12.0)
Neutro Abs: 5.3 10*3/uL (ref 1.4–7.7)
Neutrophils Relative %: 81.9 % — ABNORMAL HIGH (ref 43.0–77.0)
Platelets: 330 10*3/uL (ref 150.0–400.0)
RBC: 3.33 Mil/uL — ABNORMAL LOW (ref 4.22–5.81)
RDW: 20.4 % — ABNORMAL HIGH (ref 11.5–15.5)
WBC: 6.5 10*3/uL (ref 4.0–10.5)

## 2019-03-17 LAB — URIC ACID: Uric Acid, Serum: 5.4 mg/dL (ref 4.0–7.8)

## 2019-03-17 LAB — HEMOGLOBIN A1C: Hgb A1c MFr Bld: 6.2 % (ref 4.6–6.5)

## 2019-03-17 NOTE — Telephone Encounter (Signed)
Noted.  Pt had labs done today.

## 2019-03-18 ENCOUNTER — Telehealth: Payer: Self-pay | Admitting: *Deleted

## 2019-03-18 DIAGNOSIS — C61 Malignant neoplasm of prostate: Secondary | ICD-10-CM | POA: Diagnosis not present

## 2019-03-18 DIAGNOSIS — D630 Anemia in neoplastic disease: Secondary | ICD-10-CM | POA: Diagnosis not present

## 2019-03-18 DIAGNOSIS — C7931 Secondary malignant neoplasm of brain: Secondary | ICD-10-CM | POA: Diagnosis not present

## 2019-03-18 DIAGNOSIS — C3490 Malignant neoplasm of unspecified part of unspecified bronchus or lung: Secondary | ICD-10-CM | POA: Diagnosis not present

## 2019-03-18 DIAGNOSIS — C969 Malignant neoplasm of lymphoid, hematopoietic and related tissue, unspecified: Secondary | ICD-10-CM | POA: Diagnosis not present

## 2019-03-18 DIAGNOSIS — C9240 Acute promyelocytic leukemia, not having achieved remission: Secondary | ICD-10-CM | POA: Diagnosis not present

## 2019-03-18 MED ORDER — COLCHICINE 0.6 MG PO TABS: 0.6000 mg | ORAL_TABLET | Freq: Every day | ORAL | 0 refills | Status: DC | PRN

## 2019-03-18 NOTE — Telephone Encounter (Signed)
Hoyle Sauer PT with [facility] left a voicemail stating that she was out doing a routine visit today. Hoyle Sauer stated that patient is complaining of right ankle pain that is a level 8. Hoyle Sauer stated that the area is red and swollen. Hoyle Sauer stated that she spoke with the patient's daughter and she feels that it is a gout flare-up. Hoyle Sauer stated that she is calling in case Dr. Danise Mina wants to adjust his medications?

## 2019-03-18 NOTE — Telephone Encounter (Signed)
I would like to retry colchicine 0.6mg  2 tab on first day and then 1 tab daily as needed for gout flare. Sent to pharmacy.

## 2019-03-18 NOTE — Telephone Encounter (Signed)
Lvm for David [name] [facility] relaying Dr. Synthia Innocent message.  Spoke with pt relaying Dr. Synthia Innocent instructions.  Verbalizes understanding.

## 2019-03-19 ENCOUNTER — Encounter: Payer: Self-pay | Admitting: *Deleted

## 2019-03-20 ENCOUNTER — Encounter: Payer: Medicare Other | Admitting: Internal Medicine

## 2019-03-20 DIAGNOSIS — C7931 Secondary malignant neoplasm of brain: Secondary | ICD-10-CM | POA: Diagnosis not present

## 2019-03-20 DIAGNOSIS — C969 Malignant neoplasm of lymphoid, hematopoietic and related tissue, unspecified: Secondary | ICD-10-CM | POA: Diagnosis not present

## 2019-03-20 DIAGNOSIS — C61 Malignant neoplasm of prostate: Secondary | ICD-10-CM | POA: Diagnosis not present

## 2019-03-20 DIAGNOSIS — C9240 Acute promyelocytic leukemia, not having achieved remission: Secondary | ICD-10-CM | POA: Diagnosis not present

## 2019-03-20 DIAGNOSIS — D630 Anemia in neoplastic disease: Secondary | ICD-10-CM | POA: Diagnosis not present

## 2019-03-20 DIAGNOSIS — C3490 Malignant neoplasm of unspecified part of unspecified bronchus or lung: Secondary | ICD-10-CM | POA: Diagnosis not present

## 2019-03-20 NOTE — Telephone Encounter (Signed)
Received message from daughter - pharmacy had accidentally suddenly stopped prolonged prednisone taper which explains why he had recurrence of gout attack. They have restarted prednisone taper again.

## 2019-03-24 ENCOUNTER — Encounter: Payer: Self-pay | Admitting: Podiatry

## 2019-03-24 ENCOUNTER — Ambulatory Visit (INDEPENDENT_AMBULATORY_CARE_PROVIDER_SITE_OTHER): Payer: Medicare Other | Admitting: Podiatry

## 2019-03-24 ENCOUNTER — Other Ambulatory Visit: Payer: Self-pay

## 2019-03-24 DIAGNOSIS — M79675 Pain in left toe(s): Secondary | ICD-10-CM

## 2019-03-24 DIAGNOSIS — E119 Type 2 diabetes mellitus without complications: Secondary | ICD-10-CM

## 2019-03-24 DIAGNOSIS — M79674 Pain in right toe(s): Secondary | ICD-10-CM

## 2019-03-24 DIAGNOSIS — B351 Tinea unguium: Secondary | ICD-10-CM

## 2019-03-24 DIAGNOSIS — Q828 Other specified congenital malformations of skin: Secondary | ICD-10-CM | POA: Diagnosis not present

## 2019-03-24 DIAGNOSIS — L84 Corns and callosities: Secondary | ICD-10-CM | POA: Diagnosis not present

## 2019-03-24 NOTE — Progress Notes (Signed)
This patient presents the office for preventative foot care services and treatment of a callus under his big toe joint right foot.  This patient has not been seen for 16 months.  He presents the office with his daughter for treatment.  He says he has been hospitalized for 40 days.  He is unable to treat his nails as well as his painful callus.  He presents the office today for continued evaluation and treatment of his feet.   General Appearance  Alert, conversant and in no acute stress.  Vascular  Dorsalis pedis and posterior pulses are palpable  bilaterally.  Capillary return is within normal limits  bilaterally. Temperature is within normal limits  Bilaterally.  Neurologic  [name]-Weinstein monofilament wire test within normal limits  bilaterally. Muscle power within normal limits bilaterally.  Nails Thick and long hallux nails  B/L.  Orthopedic  No limitations of motion of motion feet bilaterally.  No crepitus or effusions noted.  DJD 1st MPJ  B/L. Plantarflexed first metatarsal right foot.  Skin  Porokeratosis sub 1 right foot. There is a hemorrhagic callus  noted in the center of this hyperkeratotic lesion.  No evidence of any fluctuance or drainage noted     No signs of infections or ulcers noted.   Porokeratosis sub 1 right foot.  Onychomycosis x 2    Debridement of hyperkeratotic tissue was performed right foot using a #15 blade.   Debridement of hallux nails  B/L.  RTC 3 months.   Gardiner Barefoot DPM

## 2019-03-25 DIAGNOSIS — C3490 Malignant neoplasm of unspecified part of unspecified bronchus or lung: Secondary | ICD-10-CM | POA: Diagnosis not present

## 2019-03-25 DIAGNOSIS — C969 Malignant neoplasm of lymphoid, hematopoietic and related tissue, unspecified: Secondary | ICD-10-CM | POA: Diagnosis not present

## 2019-03-25 DIAGNOSIS — C7931 Secondary malignant neoplasm of brain: Secondary | ICD-10-CM | POA: Diagnosis not present

## 2019-03-25 DIAGNOSIS — C9240 Acute promyelocytic leukemia, not having achieved remission: Secondary | ICD-10-CM | POA: Diagnosis not present

## 2019-03-25 DIAGNOSIS — D630 Anemia in neoplastic disease: Secondary | ICD-10-CM | POA: Diagnosis not present

## 2019-03-25 DIAGNOSIS — C61 Malignant neoplasm of prostate: Secondary | ICD-10-CM | POA: Diagnosis not present

## 2019-03-26 NOTE — Progress Notes (Signed)
Cardiology Office Note Date:  03/28/2019  Patient ID:  David Cumpston., DOB 1940/07/19, MRN [id number] PCP:  [name], MD  Cardiologist: Dr. Rockey Situ Electrophysiologist: Dr. Caryl Comes Oncologist: Dr. Biagio Borg   Chief Complaint:  device at [facility]  History of Present Illness: David [name]. is a 78 y.o. male with history of CAD (CABG 2014), HTN, HLD, OSA w/CPAP, stroke, h/o ICH 2/2 Hypertensive crisis 2017, CHB w/PPM ICM >> upgrade to CRT-P, DM, hypothyroidism, CKD (III),  ICM with improvement in LVEF Cancer history Metastatic malignant melanoma (brain s/p surgical resection, lung) Prostate cancer, planned for surveillance give comorbidities APML ,acute promyelocytic leukemia            Associated pancytopenia           Follows at [facility] >> s/p prolonged hospitalization at [facility] for treatment with all trans retinoic acid (ATRA) + arsenic trioxide (ATO) started on 10/15-16/2020. Arsenic held a few days due to prolonged QTc. ATRA was temporarily stopped due to rash. Received 38 d total ATRA. He did receive intermittent steroids for gout flares while hospitalized. Colchicine was discontinued due to concern over myelosuppression. Planned prolonged prednisone taper Treated with acyclovir ppx. Amlodipine was held due to normotensive readings. He continued aspirin and metoprolol. Was treated with sevelamer phosphate binder during hospitalization.   DNR status  Aug 2020 hospitalized with fever, AMS, weakness felt to be 2/2 UTI and  foot cellulitis  > rehab He was hospitalized Oct 2020 with fever chills, w/u eventually noted acute gout flare and ultimately treated with steroid and colchicine.  Also mention pancytopenia not a recent bone marrow bx conerning for MDS and started on Granix that resolved his leukopenia  He saw [facility] oncology 03/26/2019 , planned for ATRA (tretinoin) and arsenic trioxide (ATO) consolidation therapy  To start 03/31/2019 it looks for an 8 week  cycle  He comes in today to be seen for Dr. Caryl Comes, last seen by him June 2020, limited mostly by his arthritis.  Discussed his DNR status, though planned for generator change (?it seems 2/2 to plans for prostate surgery?)  The patient reports doing well.  He denies any cardiac awareness, no CP, palpitations.  No dizzy spells, near syncope or syncope.  No rest SOB, he denies any symptoms of PND or orthopnea.  The pt denies DOE at his level of activity.  The patient['s daughter join [place] (unfortunately initially held 2/2 vistor restrictions).  She is an MD at [employer] (Women[employer], OB/GYN) and very involved in her father's health care. He was slated to start his infusion therapy next week, though as of yet, does not have a schedule.  His [facility] oncologist Erba in conjunction with [facility] oncology will be getting these arrangements to be done at [facility] center.  She had a couple concerns she was hoping to try and get  in-front of.  While tey do not expect him to get neutropenic with this session of therapy, was wondering if we can get his device change done ahead of intitiation of his infusion therapy to try and reduce his infection risk.  And second, last time he did this therapy there was some interruption with concerns of QT prolongation ( a known potential side effect of the therapy) and some difficulties in getting a cardiologist/EP to look at his EKGs and ultimately told that his QT with correction was OK but unfortunately took 3 days to establish that.  She was wondering if somehow this could be helped out by [place].  Device information, SJM CRT-P,  initial device 1998 (remains with these RA/RV leads)  >> CRT-P 2014  Past Medical History:  Diagnosis Date  . Anginal pain ([place])   . Arthritis    mostly hands  . Benign familial hematuria   . BPH (benign prostatic hypertrophy)   . Brain cancer ([place])    melanoma with brain met  . Coronary artery disease   . GERD (gastroesophageal reflux disease)   . High  cholesterol   . Hypertension   . Hypothyroidism   . Intracerebral hemorrhage ([place])   . Melanoma ([place])   . Metastatic melanoma to lung ([place])   . Mood disorder ([place])   . Myocardial infarction ([place])   . Pacemaker    due to syncope, 3rd degree HB (upgrade to [facility]. Jude CRT-P 02/25/13 (Dr. Uvaldo Rising)  . Rectal bleed    due to NSAIDS  . Renal disorder   . Sepsis ([place]) 12/09/2018  . Skin cancer    melanoma  . Sleep apnea    uses CPAP  . Stroke [facility])     Past Surgical History:  Procedure Laterality Date  . APPLICATION OF CRANIAL NAVIGATION N/A 10/15/2015   Procedure: APPLICATION OF CRANIAL NAVIGATION;  Surgeon: Erline Levine, MD;  Location: [facility] NEURO ORS;  Service: Neurosurgery;  Laterality: N/A;  . CARDIAC CATHETERIZATION  2013  . CARDIAC SURGERY     bypass X 2  . COLONOSCOPY WITH PROPOFOL N/A 05/08/2016   diverticulosis, int hem, no f/u needed [name])  . CORONARY ARTERY BYPASS GRAFT  2013   LIMA-LAD, SVG-PDA 03/27/11 (Dr. Francee Gentile)  . CRANIOTOMY N/A 10/15/2015   Procedure: LEFT FRONTAL CRANIOTOMY TUMOR EXCISION with Curve;  Surgeon: Erline Levine, MD;  Location: [place] NEURO ORS;  Service: Neurosurgery;  Laterality: N/A;  CRANIOTOMY TUMOR EXCISION  . EYE SURGERY    . FOOT SURGERY  08/2010  . JOINT REPLACEMENT Left    partial knee  . KNEE ARTHROSCOPY  12/2008  . LYMPH NODE BIOPSY    . PACEMAKER INSERTION    . TONSILLECTOMY      Current Outpatient Medications  Medication Sig Dispense Refill  . acetaminophen (TYLENOL) 325 MG tablet Take 2 tablets (650 mg total) by mouth every 6 (six) hours as needed for mild pain (or Fever >/= 101).    Marland Kitchen acyclovir (ZOVIRAX) 200 MG capsule Take 2 capsules (400 mg total) by mouth 2 (two) times daily. Preventatively    . allopurinol (ZYLOPRIM) 100 MG tablet Take 2 tablets (200 mg total) by mouth daily. 180 tablet 1  . aspirin EC 81 MG tablet Take 81 mg by mouth every evening.     . Blood Glucose Monitoring Suppl (ONETOUCH VERIO) w/Device KIT 1 kit by Does not apply  route as directed. E11.65 1 kit 0  . carboxymethylcellulose (REFRESH TEARS) 0.5 % SOLN Apply to eye.    . Cholecalciferol (VITAMIN D3) 25 MCG (1000 UT) CAPS Take 1 capsule (1,000 Units total) by mouth daily. 30 capsule 0  . citalopram (CELEXA) 10 MG tablet Take 1 tablet (10 mg total) by mouth daily. 90 tablet 1  . colchicine 0.6 MG tablet Take 1 tablet (0.6 mg total) by mouth daily as needed (gout flare). Take 2 tablets on the first day of flare 30 tablet 0  . glucose blood test strip E11.65 Use as instructed to check sugars three time daily and as needed 100 each 12  . HUMALOG KWIKPEN 100 UNIT/ML KwikPen     . hydrocortisone cream 1 % Apply  topically.    . insulin aspart (NOVOLOG FLEXPEN) 100 UNIT/ML FlexPen Use three times daily with meals as needed per sliding scale: 2 units for every 50 units over 150 (2u for 150-199, 4u for 200-249, 6u for 250-299, etc) 3 mL 1  . ketoconazole (NIZORAL) 2 % shampoo Apply topically.    . Lancets (ONETOUCH ULTRASOFT) lancets E11.65 Use as instructed to check sugars three time daily and as needed 100 each 12  . levothyroxine (SYNTHROID) 137 MCG tablet TAKE 1 TABLET BY MOUTH ONCE DAILY BEFORE BREAKFAST (Patient taking differently: Take 137 mcg by mouth daily before breakfast. ) 30 tablet 11  . methylphenidate (RITALIN) 5 MG tablet Take 1 tablet (5 mg total) by mouth 2 (two) times daily with breakfast and lunch. 60 tablet 0  . metoprolol succinate (TOPROL-XL) 50 MG 24 hr tablet TAKE 1 AND 1/2 TABLET (75 MG) BY MOUTH EVERY DAY WITH OR IMMEDIATELY FOLLOWING A MEAL *DO NOT CRUSH* (Patient taking differently: Take 75 mg by mouth daily. ) 50 tablet 11  . mineral oil-hydrophilic petrolatum (AQUAPHOR) ointment Apply topically.    . ondansetron (ZOFRAN-ODT) 4 MG disintegrating tablet     . polyethylene glycol (MIRALAX / GLYCOLAX) 17 g packet Take 17 g by mouth 2 (two) times daily. (Patient taking differently: Take 17 g by mouth daily. ) 14 each 0  . predniSONE (DELTASONE) 5  MG tablet Take by mouth.    . senna (SENOKOT) 8.6 MG TABS tablet Take 1 tablet (8.6 mg total) by mouth at bedtime. 120 tablet 0  . senna-docusate (SENOKOT-S) 8.6-50 MG tablet Take by mouth.    . sodium chloride (ALTAMIST SPRAY) 0.65 % nasal spray Place into the nose.    . testosterone cypionate (DEPOTESTOSTERONE CYPIONATE) 200 MG/ML injection Inject 100 mg into the muscle every 14 (fourteen) days.    . traZODone (DESYREL) 50 MG tablet Take 0.5-1 tablets (25-50 mg total) by mouth at bedtime. (Patient taking differently: Take 25 mg by mouth at bedtime as needed for sleep. ) 30 tablet 1  . tretinoin (VESANOID) 10 MG capsule     . Turmeric Curcumin 500 MG CAPS Take by mouth.     No current facility-administered medications for this visit.   Facility-Administered Medications Ordered in Other Visits  Medication Dose Route Frequency Provider Last Rate Last Admin  . 0.9 %  sodium chloride infusion  1,000 mL Intravenous Once [name], MD        Allergies:   Ciprofloxacin, Ezetimibe, and Simvastatin   Social History:  The patient  reports that he quit smoking about 62 years ago. He has a 3.00 pack-year smoking history. He has never used smokeless tobacco. He reports current alcohol use of about 14.0 standard drinks of alcohol per week. He reports that he does not use drugs.   Family History:  The patient's family history includes Cancer in his maternal grandmother; Cancer (age of onset: 12) in his mother; Heart attack in his father; Heart disease in his father; Hypertension in his father and mother; Rheum arthritis in his sister.  ROS:  Please see the history of present illness. All other systems are reviewed and otherwise negative.   PHYSICAL EXAM:  VS:  BP 124/68   Pulse 69   Ht 5' 9.5" (1.765 m)   Wt 196 lb (88.9 kg)   BMI 28.53 kg/m  BMI: Body mass index is 28.53 kg/m. Well nourished, well developed, in no acute distress  HEENT: normocephalic, atraumatic  Neck: no JVD, carotid bruits  or  masses Cardiac:  RRR; no significant murmurs, no rubs, or gallops Lungs:  CTA b/l, no wheezing, rhonchi or rales  Abd: soft, nontender MS: no deformity or atrophy Ext: no edema  Skin: warm and dry, no rash Neuro:  No gross deficits appreciated Psych: euthymic mood, full affect  PPM site is stable, no tethering or discomfort   EKG:  Done today and reviewed by myself shows AV paced  PPM interrogation done today and reviewed by myself:  Battery reached ERI 02/02/2019 Lead  Measurements are good No arrhythmias   12/09/2018 TTE IMPRESSIONS 1. The left ventricle has a visually estimated ejection fraction of 55%. The cavity size was normal. There is mildly increased left ventricular wall thickness. Left ventricular diastolic Doppler parameters are consistent with impaired relaxation. Basal  to mid inferolateral hypokinesis.  2. The right ventricle has mildly reduced systolic function. The cavity was mildly enlarged. There is no increase in right ventricular wall thickness.  3. Left atrial size was mildly dilated.  4. Right atrial size was mildly dilated.  5. Mild calcification of the mitral valve leaflet. There is mild mitral annular calcification present. No evidence of mitral valve stenosis. No significant mitral regurgitation.  6. The aortic valve is tricuspid. Moderate calcification of the aortic valve. No stenosis of the aortic valve.  7. The aorta is abnormal unless otherwise noted.  8. There is mild dilatation of the aortic root measuring 39 mm.  9. The inferior vena cava was normal in size with <50% respiratory variability. No complete TR doppler jet so unable to estimate PA systolic pressure. 10. Technically difficult study with poor acoustic windows. Definity images also were poor quality.    Recent Labs: 01/09/2019: Magnesium 1.9 01/16/2019: TSH 2.17 03/17/2019: ALT 11; BUN 31; Creatinine, Ser 1.48; Hemoglobin 11.0; Platelets 330.0; Potassium 4.4; Sodium 140  No results  found for requested labs within last 8760 hours.   Estimated Creatinine Clearance: 45.1 mL/min (by C-G formula based on SCr of 1.48 mg/dL).   Wt Readings from Last 3 Encounters:  03/28/19 196 lb (88.9 kg)  03/14/19 184 lb (83.5 kg)  01/16/19 200 lb 3 oz (90.8 kg)     Other studies reviewed: Additional studies/records reviewed today include: summarized above  ASSESSMENT AND PLAN:  1. PPM     Reached ERI 02/02/2019  He is pacer dependent at 40bpm today I discussed with the patient and his daughter the generator change procedure, potential risk/benefits.  We discussed specifically his increased infection risk given this looks to be his 4th device.  (and as discussed above his pending cancer therapy) They are both agreeable to proceed.  In discussion with the patient/daughter will get his generator change done next week, and push initiation of his infusion therapy to the following week I have d/w Dr. Caryl Comes (he in vacation next week and OK to put on a partner's schedule, Dr. Lovena Le has schedule availability) The patient and daughter are OK with proceeding with another MD. They will reach out to [facility] oncology/oncologist and follow up adjusting the planned schedule and I will send my not to Dr. Elmo Putt  Will add Tyrex pouch to his procedure schedule, I will make Dr. Lovena Le aware     2. ICM with subsequent recovery of his LVEF     No symptoms or exam findings to suggest volume OL     CorVue at threshold   Remains on BB, no changes, will not add an ACE/ARB at this juncture, about to get started  on another cycle of therapy   3. HTN     No changes  4. CAD     No anginal symptoms  5. Cancer therapy     As outline above   Pending another treament of ATRA and arsenic trioxide (ATO) consolidation therapy   Manually measured QT today is 458m, QRS 2041m allowing for his wide paced QRS, his corrected QT is 42931me discussed if needed we would/could be available to evaluate his EKGs to  aid in QTc evaluation        Disposition: F/u with gen change next week with Dr. TayLovena Led routine post-op follow up.    Current medicines are reviewed at length with the patient today.  The patient did not have any concerns regarding medicines.  SigVenetia NightA-C 03/28/2019 10:15 AM     [facility] [address] [phone])  [phone])

## 2019-03-28 ENCOUNTER — Other Ambulatory Visit (HOSPITAL_COMMUNITY): Payer: Medicare Other

## 2019-03-28 ENCOUNTER — Encounter: Payer: Self-pay | Admitting: *Deleted

## 2019-03-28 ENCOUNTER — Ambulatory Visit (INDEPENDENT_AMBULATORY_CARE_PROVIDER_SITE_OTHER): Payer: Medicare Other | Admitting: Physician Assistant

## 2019-03-28 ENCOUNTER — Other Ambulatory Visit: Payer: Self-pay

## 2019-03-28 VITALS — BP 124/68 | HR 69 | Ht 69.5 in | Wt 196.0 lb

## 2019-03-28 DIAGNOSIS — I255 Ischemic cardiomyopathy: Secondary | ICD-10-CM

## 2019-03-28 DIAGNOSIS — Z950 Presence of cardiac pacemaker: Secondary | ICD-10-CM | POA: Diagnosis not present

## 2019-03-28 DIAGNOSIS — I10 Essential (primary) hypertension: Secondary | ICD-10-CM

## 2019-03-28 DIAGNOSIS — I2510 Atherosclerotic heart disease of native coronary artery without angina pectoris: Secondary | ICD-10-CM | POA: Diagnosis not present

## 2019-03-28 DIAGNOSIS — Z45010 Encounter for checking and testing of cardiac pacemaker pulse generator [battery]: Secondary | ICD-10-CM

## 2019-03-28 NOTE — Patient Instructions (Signed)
Medication Instructions:   Your physician recommends that you continue on your current medications as directed. Please refer to the Current Medication list given to you today.  *If you need a refill on your cardiac medications before your next appointment, please call your pharmacy*  Lab Work: [place]   If you have labs (blood work) drawn today and your tests are completely normal, you will receive your results only by: Marland Kitchen MyChart Message (if you have MyChart) OR . A paper copy in the mail If you have any lab test that is abnormal or we need to change your treatment, we will call you to review the results.  Testing/Procedures:   SEE LETTER FOR GEN CHANGE OUT  ON 04-02-19    Follow-Up: At [facility], you and your health needs are our priority.  As part of our continuing mission to provide you with exceptional heart care, we have created designated Provider Care Teams.  These Care Teams include your primary Cardiologist (physician) and Advanced Practice Providers (APPs -  Physician Assistants and Nurse Practitioners) who all work together to provide you with the care you need, when you need it.  Your next appointment:    AFTER 04-02-19   [place]   Other Instructions

## 2019-03-31 ENCOUNTER — Other Ambulatory Visit (HOSPITAL_COMMUNITY): Payer: Medicare Other

## 2019-03-31 ENCOUNTER — Other Ambulatory Visit: Payer: Self-pay

## 2019-03-31 ENCOUNTER — Other Ambulatory Visit
Admission: RE | Admit: 2019-03-31 | Discharge: 2019-03-31 | Disposition: A | Discharge status: Home / Self Care | Payer: Medicare Other | Source: Ambulatory Visit | Attending: Internal Medicine | Admitting: Internal Medicine

## 2019-03-31 DIAGNOSIS — Z20828 Contact with and (suspected) exposure to other viral communicable diseases: Secondary | ICD-10-CM | POA: Diagnosis not present

## 2019-03-31 DIAGNOSIS — Z01812 Encounter for preprocedural laboratory examination: Secondary | ICD-10-CM | POA: Insufficient documentation

## 2019-03-31 LAB — SARS CORONAVIRUS 2 (TAT 6-24 HRS): SARS Coronavirus 2: NEGATIVE

## 2019-04-01 DIAGNOSIS — C61 Malignant neoplasm of prostate: Secondary | ICD-10-CM | POA: Diagnosis not present

## 2019-04-01 DIAGNOSIS — C3490 Malignant neoplasm of unspecified part of unspecified bronchus or lung: Secondary | ICD-10-CM | POA: Diagnosis not present

## 2019-04-01 DIAGNOSIS — C969 Malignant neoplasm of lymphoid, hematopoietic and related tissue, unspecified: Secondary | ICD-10-CM | POA: Diagnosis not present

## 2019-04-01 DIAGNOSIS — C7931 Secondary malignant neoplasm of brain: Secondary | ICD-10-CM | POA: Diagnosis not present

## 2019-04-01 DIAGNOSIS — D630 Anemia in neoplastic disease: Secondary | ICD-10-CM | POA: Diagnosis not present

## 2019-04-01 DIAGNOSIS — C9240 Acute promyelocytic leukemia, not having achieved remission: Secondary | ICD-10-CM | POA: Diagnosis not present

## 2019-04-02 ENCOUNTER — Ambulatory Visit (HOSPITAL_COMMUNITY)
Admission: RE | Admit: 2019-04-02 | Discharge: 2019-04-02 | Disposition: A | Discharge status: Home / Self Care | Payer: Medicare Other | Attending: Internal Medicine | Admitting: Internal Medicine

## 2019-04-02 ENCOUNTER — Encounter (HOSPITAL_COMMUNITY)
Admission: RE | Disposition: A | Discharge status: Home / Self Care | Payer: Medicare Other | Source: Home / Self Care | Attending: Internal Medicine

## 2019-04-02 DIAGNOSIS — E1122 Type 2 diabetes mellitus with diabetic chronic kidney disease: Secondary | ICD-10-CM | POA: Diagnosis not present

## 2019-04-02 DIAGNOSIS — K219 Gastro-esophageal reflux disease without esophagitis: Secondary | ICD-10-CM | POA: Diagnosis not present

## 2019-04-02 DIAGNOSIS — C9240 Acute promyelocytic leukemia, not having achieved remission: Secondary | ICD-10-CM | POA: Insufficient documentation

## 2019-04-02 DIAGNOSIS — Z79899 Other long term (current) drug therapy: Secondary | ICD-10-CM | POA: Diagnosis not present

## 2019-04-02 DIAGNOSIS — Z951 Presence of aortocoronary bypass graft: Secondary | ICD-10-CM | POA: Insufficient documentation

## 2019-04-02 DIAGNOSIS — Z881 Allergy status to other antibiotic agents status: Secondary | ICD-10-CM | POA: Insufficient documentation

## 2019-04-02 DIAGNOSIS — Z7952 Long term (current) use of systemic steroids: Secondary | ICD-10-CM | POA: Insufficient documentation

## 2019-04-02 DIAGNOSIS — I2510 Atherosclerotic heart disease of native coronary artery without angina pectoris: Secondary | ICD-10-CM | POA: Insufficient documentation

## 2019-04-02 DIAGNOSIS — G4733 Obstructive sleep apnea (adult) (pediatric): Secondary | ICD-10-CM | POA: Diagnosis not present

## 2019-04-02 DIAGNOSIS — C61 Malignant neoplasm of prostate: Secondary | ICD-10-CM | POA: Diagnosis not present

## 2019-04-02 DIAGNOSIS — I5022 Chronic systolic (congestive) heart failure: Secondary | ICD-10-CM | POA: Diagnosis not present

## 2019-04-02 DIAGNOSIS — Z7982 Long term (current) use of aspirin: Secondary | ICD-10-CM | POA: Insufficient documentation

## 2019-04-02 DIAGNOSIS — M1990 Unspecified osteoarthritis, unspecified site: Secondary | ICD-10-CM | POA: Insufficient documentation

## 2019-04-02 DIAGNOSIS — N183 Chronic kidney disease, stage 3 unspecified: Secondary | ICD-10-CM | POA: Insufficient documentation

## 2019-04-02 DIAGNOSIS — E039 Hypothyroidism, unspecified: Secondary | ICD-10-CM | POA: Diagnosis not present

## 2019-04-02 DIAGNOSIS — Z87891 Personal history of nicotine dependence: Secondary | ICD-10-CM | POA: Insufficient documentation

## 2019-04-02 DIAGNOSIS — Z45010 Encounter for checking and testing of cardiac pacemaker pulse generator [battery]: Secondary | ICD-10-CM | POA: Insufficient documentation

## 2019-04-02 DIAGNOSIS — D61818 Other pancytopenia: Secondary | ICD-10-CM | POA: Insufficient documentation

## 2019-04-02 DIAGNOSIS — Z8673 Personal history of transient ischemic attack (TIA), and cerebral infarction without residual deficits: Secondary | ICD-10-CM | POA: Insufficient documentation

## 2019-04-02 DIAGNOSIS — Z950 Presence of cardiac pacemaker: Secondary | ICD-10-CM | POA: Diagnosis present

## 2019-04-02 DIAGNOSIS — I130 Hypertensive heart and chronic kidney disease with heart failure and stage 1 through stage 4 chronic kidney disease, or unspecified chronic kidney disease: Secondary | ICD-10-CM | POA: Diagnosis not present

## 2019-04-02 DIAGNOSIS — C7931 Secondary malignant neoplasm of brain: Secondary | ICD-10-CM | POA: Insufficient documentation

## 2019-04-02 DIAGNOSIS — Z794 Long term (current) use of insulin: Secondary | ICD-10-CM | POA: Diagnosis not present

## 2019-04-02 DIAGNOSIS — Z66 Do not resuscitate: Secondary | ICD-10-CM | POA: Diagnosis not present

## 2019-04-02 DIAGNOSIS — N400 Enlarged prostate without lower urinary tract symptoms: Secondary | ICD-10-CM | POA: Insufficient documentation

## 2019-04-02 DIAGNOSIS — Z888 Allergy status to other drugs, medicaments and biological substances status: Secondary | ICD-10-CM | POA: Insufficient documentation

## 2019-04-02 DIAGNOSIS — I442 Atrioventricular block, complete: Secondary | ICD-10-CM | POA: Insufficient documentation

## 2019-04-02 DIAGNOSIS — C7800 Secondary malignant neoplasm of unspecified lung: Secondary | ICD-10-CM | POA: Diagnosis not present

## 2019-04-02 DIAGNOSIS — Z79890 Hormone replacement therapy (postmenopausal): Secondary | ICD-10-CM | POA: Insufficient documentation

## 2019-04-02 DIAGNOSIS — I255 Ischemic cardiomyopathy: Secondary | ICD-10-CM | POA: Diagnosis not present

## 2019-04-02 DIAGNOSIS — Z85820 Personal history of malignant melanoma of skin: Secondary | ICD-10-CM | POA: Diagnosis not present

## 2019-04-02 DIAGNOSIS — M109 Gout, unspecified: Secondary | ICD-10-CM | POA: Insufficient documentation

## 2019-04-02 HISTORY — PX: BIV PACEMAKER GENERATOR CHANGEOUT: EP1198

## 2019-04-02 SURGERY — BIV PACEMAKER GENERATOR CHANGEOUT

## 2019-04-02 MED ORDER — LIDOCAINE HCL 1 % IJ SOLN
INTRAMUSCULAR | Status: AC
  Filled 2019-04-02: qty 20

## 2019-04-02 MED ORDER — HEPARIN (PORCINE) IN NACL 1000-0.9 UT/500ML-% IV SOLN
INTRAVENOUS | Status: DC | PRN
  Administered 2019-04-02: 500 mL

## 2019-04-02 MED ORDER — SODIUM CHLORIDE 0.9 % IV SOLN
INTRAVENOUS | Status: AC
  Filled 2019-04-02: qty 2

## 2019-04-02 MED ORDER — SODIUM CHLORIDE 0.9 % IV SOLN: INTRAVENOUS | Status: DC

## 2019-04-02 MED ORDER — ONDANSETRON HCL 4 MG/2ML IJ SOLN: 4.0000 mg | Freq: Four times a day (QID) | INTRAMUSCULAR | Status: DC | PRN

## 2019-04-02 MED ORDER — ACETAMINOPHEN 325 MG PO TABS
325.0000 mg | ORAL_TABLET | ORAL | Status: DC | PRN
  Filled 2019-04-02: qty 2

## 2019-04-02 MED ORDER — LIDOCAINE HCL (PF) 1 % IJ SOLN
INTRAMUSCULAR | Status: DC | PRN
  Administered 2019-04-02 (×2): 60 mL

## 2019-04-02 MED ORDER — CEFAZOLIN SODIUM-DEXTROSE 2-4 GM/100ML-% IV SOLN
INTRAVENOUS | Status: AC
  Filled 2019-04-02: qty 100

## 2019-04-02 MED ORDER — MIDAZOLAM HCL 5 MG/5ML IJ SOLN
INTRAMUSCULAR | Status: DC | PRN
  Administered 2019-04-02 (×2): 1 mg via INTRAVENOUS

## 2019-04-02 MED ORDER — MIDAZOLAM HCL 5 MG/5ML IJ SOLN
INTRAMUSCULAR | Status: AC
  Filled 2019-04-02: qty 5

## 2019-04-02 MED ORDER — SODIUM CHLORIDE 0.9 % IV SOLN
80.0000 mg | INTRAVENOUS | Status: AC
  Administered 2019-04-02: 80 mg

## 2019-04-02 MED ORDER — CHLORHEXIDINE GLUCONATE 4 % EX LIQD
4.0000 "application " | Freq: Once | CUTANEOUS | Status: DC
  Filled 2019-04-02: qty 60

## 2019-04-02 MED ORDER — CEFAZOLIN SODIUM-DEXTROSE 2-4 GM/100ML-% IV SOLN
2.0000 g | INTRAVENOUS | Status: AC
  Administered 2019-04-02: 2 g via INTRAVENOUS
  Filled 2019-04-02: qty 100

## 2019-04-02 MED ORDER — FENTANYL CITRATE (PF) 100 MCG/2ML IJ SOLN
INTRAMUSCULAR | Status: AC
  Filled 2019-04-02: qty 2

## 2019-04-02 MED ORDER — FENTANYL CITRATE (PF) 100 MCG/2ML IJ SOLN
INTRAMUSCULAR | Status: DC | PRN
  Administered 2019-04-02 (×2): 12.5 ug via INTRAVENOUS

## 2019-04-02 SURGICAL SUPPLY — 7 items
CABLE SURGICAL S-101-97-12 (CABLE) ×2 IMPLANT
PACEMAKER QUDR ALLR CRT PM3562 (Pacemaker) IMPLANT
PAD PRO RADIOLUCENT 2001M-C (PAD) ×2 IMPLANT
PMKR QUADRA ALLURE CRT PM3562 (Pacemaker) ×2 IMPLANT
POUCH AIGIS-R ANTIBACT ICD (Mesh General) ×2 IMPLANT
POUCH AIGIS-R ANTIBACT ICD LRG (Mesh General) IMPLANT
TRAY PACEMAKER INSERTION (PACKS) ×2 IMPLANT

## 2019-04-02 NOTE — Discharge Instructions (Signed)
Pacemaker Battery Change, Care After This sheet gives you information about how to care for yourself after your procedure. Your health care provider may also give you more specific instructions. If you have problems or questions, contact your health care provider. What can I expect after the procedure? After your procedure, it is common to have:  Pain or soreness at the site where the pacemaker was inserted.  Swelling at the site where the pacemaker was inserted. Follow these instructions at home: Incision care   Keep the incision clean and dry. ? Do not take baths, swim, or use a hot tub until your health care provider approves. ? You may shower the day after your procedure, or as directed by your health care provider. ? Pat the area dry with a clean towel. Do not rub the area. This may cause bleeding.  Follow instructions from your health care provider about how to take care of your incision. Make sure you: ? Wash your hands with soap and water before you change your bandage (dressing). If soap and water are not available, use hand sanitizer. ? Change your dressing as told by your health care provider. ? Leave stitches (sutures), skin glue, or adhesive strips in place. These skin closures may need to stay in place for 2 weeks or longer. If adhesive strip edges start to loosen and curl up, you may trim the loose edges. Do not remove adhesive strips completely unless your health care provider tells you to do that.  Check your incision area every day for signs of infection. Check for: ? More redness, swelling, or pain. ? More fluid or blood. ? Warmth. ? Pus or a bad smell. Activity  Do not lift anything that is heavier than 10 lb (4.5 kg) until your health care provider says it is okay to do so.  For the first 2 weeks, or as long as told by your health care provider: ? Avoid lifting your left arm higher than your shoulder. ? Be gentle when you move your arms over your head. It is okay  to raise your arm to comb your hair. ? Avoid strenuous exercise.  Ask your health care provider when it is okay to: ? Resume your normal activities. ? Return to work or school. ? Resume sexual activity. Eating and drinking  Eat a heart-healthy diet. This should include plenty of fresh fruits and vegetables, whole grains, low-fat dairy products, and lean protein like chicken and fish.  Limit alcohol intake to no more than 1 drink a day for non-pregnant women and 2 drinks a day for men. One drink equals 12 oz of beer, 5 oz of wine, or 1 oz of hard liquor.  Check ingredients and nutrition facts on packaged foods and beverages. Avoid the following types of food: ? Food that is high in salt (sodium). ? Food that is high in saturated fat, like full-fat dairy or red meat. ? Food that is high in trans fat, like fried food. ? Food and drinks that are high in sugar. Lifestyle  Do not use any products that contain nicotine or tobacco, such as cigarettes and e-cigarettes. If you need help quitting, ask your health care provider.  Take steps to manage and control your weight.  Get regular exercise. Aim for 150 minutes of moderate-intensity exercise (such as walking or yoga) or 75 minutes of vigorous exercise (such as running or swimming) each week.  Manage other health problems, such as diabetes or high blood pressure. Ask your health  care provider how you can manage these conditions. General instructions  Do not drive for 24 hours after your procedure if you were given a medicine to help you relax (sedative).  Take over-the-counter and prescription medicines only as told by your health care provider.  Avoid putting pressure on the area where the pacemaker was placed.  If you need an MRI after your pacemaker has been placed, be sure to tell the health care provider who orders the MRI that you have a pacemaker.  Avoid close and prolonged exposure to electrical devices that have strong  magnetic fields. These include: ? Cell phones. Avoid keeping them in a pocket near the pacemaker, and try using the ear opposite the pacemaker. ? MP3 players. ? Household appliances, like microwaves. ? Metal detectors. ? Electric generators. ? High-tension wires.  Keep all follow-up visits as directed by your health care provider. This is important. Contact a health care provider if:  You have pain at the incision site that is not relieved by over-the-counter or prescription medicines.  You have any of these around your incision site or coming from it: ? More redness, swelling, or pain. ? Fluid or blood. ? Warmth to the touch. ? Pus or a bad smell.  You have a fever.  You feel brief, occasional palpitations, light-headedness, or any symptoms that you think might be related to your heart. Get help right away if:  You experience chest pain that is different from the pain at the pacemaker site.  You develop a red streak that extends above or below the incision site.  You experience shortness of breath.  You have palpitations or an irregular heartbeat.  You have light-headedness that does not go away quickly.  You faint or have dizzy spells.  Your pulse suddenly drops or increases rapidly and does not return to normal.  You begin to gain weight and your legs and ankles swell. Summary  After your procedure, it is common to have pain, soreness, and some swelling where the pacemaker was inserted.  Make sure to keep your incision clean and dry. Follow instructions from your health care provider about how to take care of your incision.  Check your incision every day for signs of infection, such as more pain or swelling, pus or a bad smell, warmth, or leaking fluid and blood.  Avoid strenuous exercise and lifting your left arm higher than your shoulder for 2 weeks, or as long as told by your health care provider. This information is not intended to replace advice given to you by  your health care provider. Make sure you discuss any questions you have with your health care provider. Document Released: 01/15/2013 Document Revised: 03/09/2017 Document Reviewed: 02/17/2016 Elsevier Patient Education  [place] procedure care instructions Keep incision clean and dry for 10 days. No driving for 2 days.  You can remove outer dressing tomorrow. Leave steri-strips (little pieces of tape) on until seen in the office for wound check appointment. Call the office [phone]) for redness, drainage, swelling, or fever.

## 2019-04-02 NOTE — H&P (Signed)
Cardiology Office Note  Date: 03/28/2019  Patient ID: Khalil Szczepanik., DOB 1940-11-26, MRN [id number]  PCP: [name], MD  Cardiologist: Dr. Rockey Situ  Electrophysiologist: Dr. Caryl Comes  Oncologist: Dr. Biagio Borg  Chief Complaint: device at [facility]  History of Present Illness:  Connel[place]. is a 78 y.o. male with history of CAD (CABG 2014), HTN, HLD, OSA w/CPAP, stroke, h/o ICH 2/2 Hypertensive crisis 2017, CHB w/PPM ICM >> upgrade to CRT-P, DM, hypothyroidism, CKD (III), ICM with improvement in LVEF  Cancer history  Metastatic malignant melanoma (brain s/p surgical resection, lung)  Prostate cancer, planned for surveillance give comorbidities  APML ,acute promyelocytic leukemia  Associated pancytopenia  Follows at [facility] >>  s/p prolonged hospitalization at [facility] for treatment with all trans retinoic acid (ATRA) + arsenic trioxide (ATO) started on 10/15-16/2020. Arsenic held a few days due to prolonged QTc. ATRA was temporarily stopped due to rash. Received 38 d total ATRA. He did receive intermittent steroids for gout flares while hospitalized. Colchicine was discontinued due to concern over myelosuppression. Planned prolonged prednisone taper  Treated with acyclovir ppx. Amlodipine was held due to normotensive readings. He continued aspirin and metoprolol. Was treated with sevelamer phosphate binder during hospitalization.  DNR status  Aug 2020 hospitalized with fever, AMS, weakness felt to be 2/2 UTI and foot cellulitis > rehab  He was hospitalized Oct 2020 with fever chills, w/u eventually noted acute gout flare and ultimately treated with steroid and colchicine. Also mention pancytopenia not a recent bone marrow bx conerning for MDS and started on Granix that resolved his leukopenia  He saw [facility] oncology 03/26/2019 , planned for ATRA (tretinoin) and arsenic trioxide (ATO) consolidation therapy  To start 03/31/2019 it looks for an 8 week cycle  He comes in today to be seen  for Dr. Caryl Comes, last seen by him June 2020, limited mostly by his arthritis. Discussed his DNR status, though planned for generator change (?it seems 2/2 to plans for prostate surgery?)  The patient reports doing well. He denies any cardiac awareness, no CP, palpitations. No dizzy spells, near syncope or syncope. No rest SOB, he denies any symptoms of PND or orthopnea. The pt denies DOE at his level of activity.  The patient['s daughter join [place] (unfortunately initially held 2/2 vistor restrictions). She is an MD at [employer] (Women's, OB/GYN) and very involved in her father's health care.  He was slated to start his infusion therapy next week, though as of yet, does not have a schedule. His [facility] oncologist Erba in conjunction with [facility] oncology will be getting these arrangements to be done at [facility] center.  She had a couple concerns she was hoping to try and get in-front of. While tey do not expect him to get neutropenic with this session of therapy, was wondering if we can get his device change done ahead of intitiation of his infusion therapy to try and reduce his infection risk.  And second, last time he did this therapy there was some interruption with concerns of QT prolongation ( a known potential side effect of the therapy) and some difficulties in getting a cardiologist/EP to look at his EKGs and ultimately told that his QT with correction was OK but unfortunately took 3 days to establish that. She was wondering if somehow this could be helped out by [place].  Device information,  SJM CRT-P, initial device 1998 (remains with these RA/RV leads) >> CRT-P 2014      Past Medical History:  Diagnosis  Date  . Anginal pain ([place])   . Arthritis    mostly hands  . Benign familial hematuria   . BPH (benign prostatic hypertrophy)   . Brain cancer ([place])    melanoma with brain met  . Coronary artery disease   . GERD (gastroesophageal reflux disease)   . High cholesterol   . Hypertension   . Hypothyroidism   .  Intracerebral hemorrhage ([place])   . Melanoma ([place])   . Metastatic melanoma to lung ([place])   . Mood disorder ([place])   . Myocardial infarction ([place])   . Pacemaker    due to syncope, 3rd degree HB (upgrade to [facility]. Jude CRT-P 02/25/13 (Dr. Uvaldo Rising)  . Rectal bleed    due to NSAIDS  . Renal disorder   . Sepsis ([place]) 12/09/2018  . Skin cancer    melanoma  . Sleep apnea    uses CPAP  . Stroke [facility])         Past Surgical History:  Procedure Laterality Date  . APPLICATION OF CRANIAL NAVIGATION N/A 10/15/2015   Procedure: APPLICATION OF CRANIAL NAVIGATION; Surgeon: Erline Levine, MD; Location: [place] [facility]UR[facility] ORS; Service: Neurosurgery; Laterality: N/A;  . CARDIAC CATHETERIZATION  2013  . CARDIAC SURGERY     bypass X 2  . COLONOSCOPY WITH PROPOFOL N/A 05/08/2016   diverticulosis, int hem, no f/u needed [name])  . CORONARY ARTERY BYPASS GRAFT  2013   LIMA-LAD, SVG-PDA 03/27/11 (Dr. Francee Gentile)  . CRANIOTOMY N/A 10/15/2015   Procedure: LEFT FRONTAL CRANIOTOMY TUMOR EXCISION with Curve; Surgeon: Erline Levine, MD; Location: [place] NEUR[facility] ORS; Service: Neurosurgery; Laterality: N/A; CRANIOTOMY TUMOR EXCISION  . EYE SURGERY    . FOOT SURGERY  08/2010  . JOINT REPLACEMENT Left    partial knee  . KNEE ARTHROSCOPY  12/2008  . LYMPH NODE BIOPSY    . PACEMAKER INSERTION    . TONSILLECTOMY           Current Outpatient Medications  Medication Sig Dispense Refill  . acetaminophen (TYLENOL) 325 MG tablet Take 2 tablets (650 mg total) by mouth every 6 (six) hours as needed for mild pain (or Fever >/= 101).    Marland Kitchen acyclovir (ZOVIRAX) 200 MG capsule Take 2 capsules (400 mg total) by mouth 2 (two) times daily. Preventatively    . allopurinol (ZYLOPRIM) 100 MG tablet Take 2 tablets (200 mg total) by mouth daily. 180 tablet 1  . aspirin EC 81 MG tablet Take 81 mg by mouth every evening.     . Blood Glucose Monitoring Suppl (ONETOUCH VERIO) w/Device KIT 1 kit by Does not apply route as directed. E11.65 1 kit 0  .  carboxymethylcellulose (REFRESH TEARS) 0.5 % SOLN Apply to eye.    . Cholecalciferol (VITAMIN D3) 25 MCG (1000 UT) CAPS Take 1 capsule (1,000 Units total) by mouth daily. 30 capsule 0  . citalopram (CELEXA) 10 MG tablet Take 1 tablet (10 mg total) by mouth daily. 90 tablet 1  . colchicine 0.6 MG tablet Take 1 tablet (0.6 mg total) by mouth daily as needed (gout flare). Take 2 tablets on the first day of flare 30 tablet 0  . glucose blood test strip E11.65 Use as instructed to check sugars three time daily and as needed 100 each 12  . HUMALOG KWIKPEN 100 UNIT/ML KwikPen     . hydrocortisone cream 1 % Apply topically.    . insulin aspart (NOVOLOG FLEXPEN) 100 UNIT/ML FlexPen Use three times daily with meals as needed per sliding scale:  2 units for every 50 units over 150 (2u for 150-199, 4u for 200-249, 6u for 250-299, etc) 3 mL 1  . ketoconazole (NIZORAL) 2 % shampoo Apply topically.    . Lancets (ONETOUCH ULTRASOFT) lancets E11.65 Use as instructed to check sugars three time daily and as needed 100 each 12  . levothyroxine (SYNTHROID) 137 MCG tablet TAKE 1 TABLET BY MOUTH ONCE DAILY BEFORE BREAKFAST (Patient taking differently: Take 137 mcg by mouth daily before breakfast. ) 30 tablet 11  . methylphenidate (RITALIN) 5 MG tablet Take 1 tablet (5 mg total) by mouth 2 (two) times daily with breakfast and lunch. 60 tablet 0  . metoprolol succinate (TOPROL-XL) 50 MG 24 hr tablet TAKE 1 AND 1/2 TABLET (75 MG) BY MOUTH EVERY DAY WITH OR IMMEDIATELY FOLLOWING A MEAL *DO NOT CRUSH* (Patient taking differently: Take 75 mg by mouth daily. ) 50 tablet 11  . mineral oil-hydrophilic petrolatum (AQUAPHOR) ointment Apply topically.    . ondansetron (ZOFRAN-ODT) 4 MG disintegrating tablet     . polyethylene glycol (MIRALAX / GLYCOLAX) 17 g packet Take 17 g by mouth 2 (two) times daily. (Patient taking differently: Take 17 g by mouth daily. ) 14 each 0  . predniSONE (DELTASONE) 5 MG tablet Take by mouth.    . senna  (SENOKOT) 8.6 MG TABS tablet Take 1 tablet (8.6 mg total) by mouth at bedtime. 120 tablet 0  . senna-docusate (SENOKOT-S) 8.6-50 MG tablet Take by mouth.    . sodium chloride (ALTAMIST SPRAY) 0.65 % nasal spray Place into the nose.    . testosterone cypionate (DEPOTESTOSTERONE CYPIONATE) 200 MG/ML injection Inject 100 mg into the muscle every 14 (fourteen) days.    . traZODone (DESYREL) 50 MG tablet Take 0.5-1 tablets (25-50 mg total) by mouth at bedtime. (Patient taking differently: Take 25 mg by mouth at bedtime as needed for sleep. ) 30 tablet 1  . tretinoin (VESANOID) 10 MG capsule     . Turmeric Curcumin 500 MG CAPS Take by mouth.     No current facility-administered medications for this visit.            Facility-Administered Medications Ordered in Other Visits  Medication Dose Route Frequency Provider Last Rate Last Admin  . 0.9 % sodium chloride infusion 1,000 mL Intravenous Once [name], MD     Allergies: Ciprofloxacin, Ezetimibe, and Simvastatin  Social History: The patient reports that he quit smoking about 62 years ago. He has a 3.00 pack-year smoking history. He has never used smokeless tobacco. He reports current alcohol use of about 14.0 standard drinks of alcohol per week. He reports that he does not use drugs.  Family History: The patient's family history includes Cancer in his maternal grandmother; Cancer (age of onset: 20) in his mother; Heart attack in his father; Heart disease in his father; Hypertension in his father and mother; Rheum arthritis in his sister.  ROS: Please see the history of present illness.  All other systems are reviewed and otherwise negative.  PHYSICAL EXAM:  VS: BP 124/68  Pulse 69  Ht 5' 9.5" (1.765 m)  Wt 196 lb (88.9 kg)  BMI 28.53 kg/m BMI: Body mass index is 28.53 kg/m.  Well nourished, well developed, in no acute distress  HEENT: normocephalic, atraumatic  Neck: no JVD, carotid bruits or masses  Cardiac: RRR; no significant murmurs, no  rubs, or gallops  Lungs: CTA b/l, no wheezing, rhonchi or rales  Abd: soft, nontender  MS: no deformity or atrophy  Ext: no edema  Skin: warm and dry, no rash  Neuro: No gross deficits appreciated  Psych: euthymic mood, full affect  PPM site is stable, no tethering or discomfort  EKG: Done today and reviewed by myself shows AV paced  PPM interrogation done today and reviewed by myself:  Battery reached ERI 02/02/2019  Lead Measurements are good  No arrhythmias  12/09/2018 TTE  IMPRESSIONS  1. The left ventricle has a visually estimated ejection fraction of 55%. The cavity size was normal. There is mildly increased left ventricular wall thickness. Left ventricular diastolic Doppler parameters are consistent with impaired relaxation. Basal  to mid inferolateral hypokinesis.  2. The right ventricle has mildly reduced systolic function. The cavity was mildly enlarged. There is no increase in right ventricular wall thickness.  3. Left atrial size was mildly dilated.  4. Right atrial size was mildly dilated.  5. Mild calcification of the mitral valve leaflet. There is mild mitral annular calcification present. No evidence of mitral valve stenosis. No significant mitral regurgitation.  6. The aortic valve is tricuspid. Moderate calcification of the aortic valve. No stenosis of the aortic valve.  7. The aorta is abnormal unless otherwise noted.  8. There is mild dilatation of the aortic root measuring 39 mm.  9. The inferior vena cava was normal in size with <50% respiratory variability. No complete TR doppler jet so unable to estimate PA systolic pressure.  10. Technically difficult study with poor acoustic windows. Definity images also were poor quality.  Recent Labs:  01/09/2019: Magnesium 1.9  01/16/2019: TSH 2.17  03/17/2019: ALT 11; BUN 31; Creatinine, Ser 1.48; Hemoglobin 11.0; Platelets 330.0; Potassium 4.4; Sodium 140  No results found for requested labs within last 8760 hours.    Estimated Creatinine Clearance: 45.1 mL/min (by C-G formula based on SCr of 1.48 mg/dL).     Wt Readings from Last 3 Encounters:  03/28/19 196 lb (88.9 kg)  03/14/19 184 lb (83.5 kg)  01/16/19 200 lb 3 oz (90.8 kg)   Other studies reviewed:  Additional studies/records reviewed today include: summarized above  ASSESSMENT AND PLAN:  1. PPM  Reached ERI 02/02/2019  He is pacer dependent at 40bpm today  I discussed with the patient and his daughter the generator change procedure, potential risk/benefits. We discussed specifically his increased infection risk given this looks to be his 4th device. (and as discussed above his pending cancer therapy)  They are both agreeable to proceed.  In discussion with the patient/daughter will get his generator change done next week, and push initiation of his infusion therapy to the following week  I have d/w Dr. Caryl Comes (he in vacation next week and OK to put on a partner's schedule, Dr. Lovena Le has schedule availability)  The patient and daughter are OK with proceeding with another MD.  They will reach out to [facility] oncology/oncologist and follow up adjusting the planned schedule and I will send my not to Dr. Elmo Putt  Will add Tyrex pouch to his procedure schedule, I will make Dr. Lovena Le aware  2. ICM with subsequent recovery of his LVEF  No symptoms or exam findings to suggest volume OL  CorVue at threshold  Remains on BB, no changes, will not add an ACE/ARB at this juncture, about to get started on another cycle of therapy  3. HTN  No changes  4. CAD  No anginal symptoms  5. Cancer therapy  As outline above  Pending another treament of ATRA and arsenic trioxide (ATO) consolidation  therapy  Manually measured QT today is 433m, QRS 2054m allowing for his wide paced QRS, his corrected QT is 42949mWe discussed if needed we would/could be available to evaluate his EKGs to aid in QTc evaluation  Disposition: F/u with gen change next week with Dr. TayLovena Led  routine post-op follow up.  Current medicines are reviewed at length with the patient today. The patient did not have any concerns regarding medicines.  SigVenetia NightA-C  03/28/2019 10:15 AM   [facility]  [address]  [place] [phone])  ([phone])   EP Attending  Patient seen and examined. Agree with above. The patient presents today for PPM gen change. I have reviewed the indication/risks/benefits/goals/expectations of the procedure and he wishes to proceed.  GreMikle Bosworth

## 2019-04-03 ENCOUNTER — Encounter: Payer: Self-pay | Admitting: Cardiology

## 2019-04-03 DIAGNOSIS — E785 Hyperlipidemia, unspecified: Secondary | ICD-10-CM

## 2019-04-03 DIAGNOSIS — E039 Hypothyroidism, unspecified: Secondary | ICD-10-CM

## 2019-04-03 DIAGNOSIS — I69111 Memory deficit following nontraumatic intracerebral hemorrhage: Secondary | ICD-10-CM

## 2019-04-03 DIAGNOSIS — I129 Hypertensive chronic kidney disease with stage 1 through stage 4 chronic kidney disease, or unspecified chronic kidney disease: Secondary | ICD-10-CM

## 2019-04-03 DIAGNOSIS — I2510 Atherosclerotic heart disease of native coronary artery without angina pectoris: Secondary | ICD-10-CM

## 2019-04-03 DIAGNOSIS — K5900 Constipation, unspecified: Secondary | ICD-10-CM

## 2019-04-03 DIAGNOSIS — I252 Old myocardial infarction: Secondary | ICD-10-CM

## 2019-04-03 DIAGNOSIS — K117 Disturbances of salivary secretion: Secondary | ICD-10-CM

## 2019-04-03 DIAGNOSIS — Z48812 Encounter for surgical aftercare following surgery on the circulatory system: Secondary | ICD-10-CM

## 2019-04-03 DIAGNOSIS — K5790 Diverticulosis of intestine, part unspecified, without perforation or abscess without bleeding: Secondary | ICD-10-CM

## 2019-04-03 DIAGNOSIS — D630 Anemia in neoplastic disease: Secondary | ICD-10-CM | POA: Diagnosis not present

## 2019-04-03 DIAGNOSIS — Z9181 History of falling: Secondary | ICD-10-CM

## 2019-04-03 DIAGNOSIS — I69120 Aphasia following nontraumatic intracerebral hemorrhage: Secondary | ICD-10-CM

## 2019-04-03 DIAGNOSIS — M1030 Gout due to renal impairment, unspecified site: Secondary | ICD-10-CM

## 2019-04-03 DIAGNOSIS — C969 Malignant neoplasm of lymphoid, hematopoietic and related tissue, unspecified: Secondary | ICD-10-CM

## 2019-04-03 DIAGNOSIS — C7931 Secondary malignant neoplasm of brain: Secondary | ICD-10-CM

## 2019-04-03 DIAGNOSIS — Z96652 Presence of left artificial knee joint: Secondary | ICD-10-CM

## 2019-04-03 DIAGNOSIS — C3490 Malignant neoplasm of unspecified part of unspecified bronchus or lung: Secondary | ICD-10-CM

## 2019-04-03 DIAGNOSIS — G4700 Insomnia, unspecified: Secondary | ICD-10-CM

## 2019-04-03 DIAGNOSIS — E875 Hyperkalemia: Secondary | ICD-10-CM

## 2019-04-03 DIAGNOSIS — L84 Corns and callosities: Secondary | ICD-10-CM

## 2019-04-03 DIAGNOSIS — M7751 Other enthesopathy of right foot: Secondary | ICD-10-CM

## 2019-04-03 DIAGNOSIS — Z95810 Presence of automatic (implantable) cardiac defibrillator: Secondary | ICD-10-CM

## 2019-04-03 DIAGNOSIS — I442 Atrioventricular block, complete: Secondary | ICD-10-CM | POA: Diagnosis not present

## 2019-04-03 DIAGNOSIS — G4733 Obstructive sleep apnea (adult) (pediatric): Secondary | ICD-10-CM

## 2019-04-03 DIAGNOSIS — C61 Malignant neoplasm of prostate: Secondary | ICD-10-CM

## 2019-04-03 DIAGNOSIS — N183 Chronic kidney disease, stage 3 unspecified: Secondary | ICD-10-CM

## 2019-04-03 DIAGNOSIS — D631 Anemia in chronic kidney disease: Secondary | ICD-10-CM

## 2019-04-03 DIAGNOSIS — H04123 Dry eye syndrome of bilateral lacrimal glands: Secondary | ICD-10-CM

## 2019-04-03 DIAGNOSIS — Z794 Long term (current) use of insulin: Secondary | ICD-10-CM

## 2019-04-03 DIAGNOSIS — M4186 Other forms of scoliosis, lumbar region: Secondary | ICD-10-CM

## 2019-04-03 DIAGNOSIS — Z951 Presence of aortocoronary bypass graft: Secondary | ICD-10-CM

## 2019-04-03 DIAGNOSIS — C9240 Acute promyelocytic leukemia, not having achieved remission: Secondary | ICD-10-CM

## 2019-04-03 DIAGNOSIS — Z7952 Long term (current) use of systemic steroids: Secondary | ICD-10-CM

## 2019-04-03 DIAGNOSIS — R21 Rash and other nonspecific skin eruption: Secondary | ICD-10-CM

## 2019-04-03 NOTE — Progress Notes (Signed)
[place]  Telephone:([phone] [phone]  ID: David [name]. OB: 1940-08-23  MR#: [id number]  NGE#:[id number]  Patient Care Team: [name], MD as PCP - General (Family Medicine) Cira Rue, RN Nurse Navigator as Registered Nurse (Medical Oncology) Acquanetta Chain, DO as Consulting Physician [facility] and Palliative Medicine) [name], MD as Consulting Physician (Hematology and Oncology)  CHIEF COMPLAINT: Acute promyelocytic leukemia in remission  INTERVAL HISTORY: Patient is a 78 year old male with a complex medical history including stage IV melanoma with brain metastasis, prostate cancer, and cardiac disease requiring biventricular ICD.  He was recently diagnosed with APML and completed his induction chemotherapy at [facility] in approximately November 2020.  He is referred to clinic for evaluation and initiation of maintenance therapy using ATRA and arsenic trioxide.  He has chronic weakness and fatigue.  He denies any recent fevers.  He has no neurologic complaints.  He denies any chest pain, shortness of breath, cough, or hemoptysis.  He denies any nausea, vomiting, constipation, or diarrhea.  He has no urinary complaints.  Patient offers no specific complaints today.  REVIEW OF SYSTEMS:   Review of Systems  Constitutional: Positive for malaise/fatigue. Negative for fever and weight loss.  Respiratory: Negative.  Negative for cough, hemoptysis and shortness of breath.   Cardiovascular: Negative.  Negative for chest pain and leg swelling.  Gastrointestinal: Negative.  Negative for abdominal pain and nausea.  Genitourinary: Negative.  Negative for hematuria.  Musculoskeletal: Negative.  Negative for back pain.  Skin: Negative.  Negative for rash.  Neurological: Positive for weakness. Negative for dizziness, focal weakness and headaches.  Psychiatric/Behavioral: Negative.  The patient is not nervous/anxious.     As  per HPI. Otherwise, a complete review of systems is negative.  PAST MEDICAL HISTORY: Past Medical History:  Diagnosis Date  . Anginal pain ([place])   . Arthritis    mostly hands  . Benign familial hematuria   . BPH (benign prostatic hypertrophy)   . Brain cancer ([place])    melanoma with brain met  . Coronary artery disease   . GERD (gastroesophageal reflux disease)   . High cholesterol   . Hypertension   . Hypothyroidism   . Intracerebral hemorrhage ([place])   . Melanoma ([place])   . Metastatic melanoma to lung ([place])   . Mood disorder ([place])   . Myocardial infarction ([place])   . Pacemaker    due to syncope, 3rd degree HB (upgrade to [facility] Jude CRT-P 02/25/13 (Dr. Uvaldo Rising)  . Rectal bleed    due to NSAIDS  . Renal disorder   . Sepsis ([place]) 12/09/2018  . Skin cancer    melanoma  . Sleep apnea    uses CPAP  . Stroke [facility])     PAST SURGICAL HISTORY: Past Surgical History:  Procedure Laterality Date  . APPLICATION OF CRANIAL NAVIGATION N/A 10/15/2015   Procedure: APPLICATION OF CRANIAL NAVIGATION;  Surgeon: Erline Levine, MD;  Location: [place] NEURO ORS;  Service: Neurosurgery;  Laterality: N/A;  . BIV PACEMAKER GENERATOR CHANGEOUT N/A 04/02/2019   Procedure: BIV PACEMAKER GENERATOR CHANGEOUT;  Surgeon: Evans Lance, MD;  Location: [place] CV LAB;  Service: Cardiovascular;  Laterality: N/A;  . CARDIAC CATHETERIZATION  2013  . CARDIAC SURGERY     bypass X 2  . COLONOSCOPY WITH PROPOFOL N/A 05/08/2016   diverticulosis, int hem, no f/u needed [name])  . CORONARY ARTERY BYPASS GRAFT  2013   LIMA-LAD, SVG-PDA 03/27/11 (Dr. Francee Gentile)  .  CRANIOTOMY N/A 10/15/2015   Procedure: LEFT FRONTAL CRANIOTOMY TUMOR EXCISION with Curve;  Surgeon: Erline Levine, MD;  Location: [place] NEURO ORS;  Service: Neurosurgery;  Laterality: N/A;  CRANIOTOMY TUMOR EXCISION  . EYE SURGERY    . FOOT SURGERY  08/2010  . JOINT REPLACEMENT Left    partial knee  . KNEE ARTHROSCOPY  12/2008  . LYMPH NODE BIOPSY    . PACEMAKER  INSERTION    . TONSILLECTOMY      FAMILY HISTORY: Family History  Problem Relation Age of Onset  . Cancer Mother 58       lung   . Hypertension Mother   . Hypertension Father   . Heart attack Father   . Heart disease Father   . Rheum arthritis Sister   . Cancer Maternal Grandmother     ADVANCED DIRECTIVES (Y/N):  N  HEALTH MAINTENANCE: Social History   Tobacco Use  . Smoking status: Former Smoker    Packs/day: 1.00    Years: 3.00    Pack years: 3.00    Quit date: 04/10/1957    Years since quitting: 62.0  . Smokeless tobacco: Never Used  Substance Use Topics  . Alcohol use: Yes    Alcohol/week: 14.0 standard drinks    Types: 10 Glasses of wine, 4 Cans of beer per week  . Drug use: No     Colonoscopy:  PAP:  Bone density:  Lipid panel:  Allergies  Allergen Reactions  . Ciprofloxacin Other (See Comments)    Tendonitis  . Ezetimibe Other (See Comments)    Unknown reaction  . Simvastatin Other (See Comments)    Reaction:  Gave pt a fever  Fever - temp of 103.   In 2003    Current Outpatient Medications  Medication Sig Dispense Refill  . acetaminophen (TYLENOL) 325 MG tablet Take 2 tablets (650 mg total) by mouth every 6 (six) hours as needed for mild pain (or Fever >/= 101).    Marland Kitchen acyclovir (ZOVIRAX) 400 MG tablet Take 400 mg by mouth 2 (two) times daily.    Marland Kitchen allopurinol (ZYLOPRIM) 100 MG tablet Take 2 tablets (200 mg total) by mouth daily. 180 tablet 1  . aspirin EC 81 MG tablet Take 81 mg by mouth every evening.     . Blood Glucose Monitoring Suppl (ONETOUCH VERIO) w/Device KIT 1 kit by Does not apply route as directed. E11.65 1 kit 0  . carboxymethylcellulose (REFRESH TEARS) 0.5 % SOLN Apply to eye.    . citalopram (CELEXA) 10 MG tablet Take 1 tablet (10 mg total) by mouth daily. 90 tablet 1  . colchicine 0.6 MG tablet Take 1 tablet (0.6 mg total) by mouth daily as needed (gout flare). Take 2 tablets on the first day of flare 30 tablet 0  . Cholecalciferol  (VITAMIN D3) 25 MCG (1000 UT) CAPS Take 1 capsule (1,000 Units total) by mouth daily. (Patient not taking: Reported on 04/10/2019) 30 capsule 0  . glucose blood test strip E11.65 Use as instructed to check sugars three time daily and as needed 100 each 12  . HUMALOG KWIKPEN 100 UNIT/ML KwikPen     . hydrocortisone cream 1 % Apply topically.    . insulin aspart (NOVOLOG FLEXPEN) 100 UNIT/ML FlexPen Use three times daily with meals as needed per sliding scale: 2 units for every 50 units over 150 (2u for 150-199, 4u for 200-249, 6u for 250-299, etc) 3 mL 1  . ketoconazole (NIZORAL) 2 % shampoo Apply topically.    Marland Kitchen  Lancets (ONETOUCH ULTRASOFT) lancets E11.65 Use as instructed to check sugars three time daily and as needed 100 each 12  . levothyroxine (SYNTHROID) 137 MCG tablet TAKE 1 TABLET BY MOUTH ONCE DAILY BEFORE BREAKFAST (Patient taking differently: Take 137 mcg by mouth daily before breakfast. ) 30 tablet 11  . methylphenidate (RITALIN) 5 MG tablet Take 1 tablet (5 mg total) by mouth 2 (two) times daily with breakfast and lunch. 60 tablet 0  . metoprolol succinate (TOPROL-XL) 50 MG 24 hr tablet TAKE 1 AND 1/2 TABLET (75 MG) BY MOUTH EVERY DAY WITH OR IMMEDIATELY FOLLOWING A MEAL *DO NOT CRUSH* (Patient taking differently: Take 50 mg by mouth daily. ) 50 tablet 11  . mineral oil-hydrophilic petrolatum (AQUAPHOR) ointment Apply topically.    . ondansetron (ZOFRAN-ODT) 4 MG disintegrating tablet     . polyethylene glycol (MIRALAX / GLYCOLAX) 17 g packet Take 17 g by mouth 2 (two) times daily. (Patient taking differently: Take 17 g by mouth daily. ) 14 each 0  . predniSONE (DELTASONE) 5 MG tablet Take 10 mg by mouth daily.     Marland Kitchen senna (SENOKOT) 8.6 MG TABS tablet Take 1 tablet (8.6 mg total) by mouth at bedtime. 120 tablet 0  . senna-docusate (SENOKOT-S) 8.6-50 MG tablet Take by mouth.    . sodium chloride (ALTAMIST SPRAY) 0.65 % nasal spray Place into the nose.    . testosterone cypionate  (DEPOTESTOSTERONE CYPIONATE) 200 MG/ML injection Inject 100 mg into the muscle every 14 (fourteen) days.    . traZODone (DESYREL) 50 MG tablet Take 0.5-1 tablets (25-50 mg total) by mouth at bedtime. (Patient taking differently: Take 25 mg by mouth at bedtime as needed for sleep. ) 30 tablet 1  . tretinoin (VESANOID) 10 MG capsule     . Turmeric Curcumin 500 MG CAPS Take by mouth.     No current facility-administered medications for this visit.   Facility-Administered Medications Ordered in Other Visits  Medication Dose Route Frequency Provider Last Rate Last Admin  . 0.9 %  sodium chloride infusion  1,000 mL Intravenous Once [name], MD        OBJECTIVE: Vitals:   04/10/19 1422  BP: (!) 154/113  Pulse: 74  Temp: (!) 96.7 F (35.9 C)  SpO2: 99%     Body mass index is 29.49 kg/m.    ECOG FS:1 - Symptomatic but completely ambulatory  General: Well-developed, well-nourished, no acute distress.  Sitting in a wheelchair. Eyes: Pink conjunctiva, anicteric sclera. HEENT: Normocephalic, moist mucous membranes. Chest wall: Biventricular ICD and port in place. Lungs: No audible wheezing or coughing. Heart: Regular rate and rhythm. Abdomen: Soft, nontender, no obvious distention. Musculoskeletal: No edema, cyanosis, or clubbing. Neuro: Alert, answering all questions appropriately. Cranial nerves grossly intact. Skin: No rashes or petechiae noted. Psych: Normal affect. Lymphatics: No cervical, calvicular, axillary or inguinal LAD.   LAB RESULTS:  Lab Results  Component Value Date   NA 140 03/17/2019   K 4.4 03/17/2019   CL 108 03/17/2019   CO2 25 03/17/2019   GLUCOSE 152 (H) 03/17/2019   BUN 31 (H) 03/17/2019   CREATININE 1.48 03/17/2019   CALCIUM 8.5 03/17/2019   PROT 5.1 (L) 03/17/2019   ALBUMIN 3.2 (L) 03/17/2019   AST 13 03/17/2019   ALT 11 03/17/2019   ALKPHOS 73 03/17/2019   BILITOT 0.5 03/17/2019   GFRNONAA 51 (L) 01/09/2019   GFRAA 59 (L) 01/09/2019    Lab  Results  Component Value Date  WBC 6.5 03/17/2019   NEUTROABS 5.3 03/17/2019   HGB 11.0 (L) 03/17/2019   HCT 33.8 (L) 03/17/2019   MCV 101.5 (H) 03/17/2019   PLT 330.0 03/17/2019     STUDIES: EP PPM/ICD IMPLANT  Result Date: 04/02/2019 Conclusion: successful Biv PPM gen change out in a patient with CHB and chronic systolic heart failure, s/p PPM insertion with the old device at [facility]. Gregg Taylor,M.D.   ASSESSMENT: Acute promyelocytic leukemia in remission  PLAN:    1. Acute promyelocytic leukemia in remission: Patient completed his induction chemotherapy at [facility] in approximately November 2020.  Peripheral blood FISH-PML t(15:17) noted complete molecular remission.  By report, patient has significant nausea and rash when taking ATRA.  Plan to proceed with maintenance chemotherapy using ATRA 14 days on 14 days off along with arsenic trioxide at 0.15 mg/kg/day Monday through Friday for 4 weeks then 4 weeks off.  Each cycle will be a total of 8 weeks.  Pending patient's toleration of treatment, will plan to do a total of 4 cycles or 32 weeks of treatment.  Return to clinic on April 14, 2019 to initiate cycle 1 day 1 of treatment. 2.  Nausea: Patient plans to take ondansetron with every dose of ATRA. 3.  Rash: Continue topical treatments as directed.  Can also consider prednisone taper if necessary. 4.  Cardiac disease: Given patient's biventricular pacing, he has a significantly abnormal EKG with a prolonged QTC of approximately 560.  Although treatment should be discontinued with a QTC greater than 500, this is patient's baseline and will continue to monitor with weekly EKGs.  Continue close follow-up with cardiology as scheduled. 5.  Melanoma: No obvious evidence of recurrence.  Monitor. 6.  Prostate cancer: Patient's most recent PCA in June 2020 was reported at 4.35.  Patient expressed understanding and was in agreement with this plan. He also understands that He can call  clinic at any time with any questions, concerns, or complaints.   Cancer Staging Malignant melanoma [facility]) Staging form: Melanoma of the Skin, AJCC 7th Edition - Clinical stage from 09/21/2015: Stage IV ([place], [place], [place]) - Signed by [name], MD on 10/09/2015   [name], MD   04/11/2019 10:00 AM

## 2019-04-04 DIAGNOSIS — C3490 Malignant neoplasm of unspecified part of unspecified bronchus or lung: Secondary | ICD-10-CM | POA: Diagnosis not present

## 2019-04-04 DIAGNOSIS — I69120 Aphasia following nontraumatic intracerebral hemorrhage: Secondary | ICD-10-CM | POA: Diagnosis not present

## 2019-04-04 DIAGNOSIS — I442 Atrioventricular block, complete: Secondary | ICD-10-CM | POA: Diagnosis not present

## 2019-04-04 DIAGNOSIS — E039 Hypothyroidism, unspecified: Secondary | ICD-10-CM | POA: Diagnosis not present

## 2019-04-04 DIAGNOSIS — C969 Malignant neoplasm of lymphoid, hematopoietic and related tissue, unspecified: Secondary | ICD-10-CM | POA: Diagnosis not present

## 2019-04-04 DIAGNOSIS — H04123 Dry eye syndrome of bilateral lacrimal glands: Secondary | ICD-10-CM | POA: Diagnosis not present

## 2019-04-04 DIAGNOSIS — K117 Disturbances of salivary secretion: Secondary | ICD-10-CM | POA: Diagnosis not present

## 2019-04-04 DIAGNOSIS — C7931 Secondary malignant neoplasm of brain: Secondary | ICD-10-CM | POA: Diagnosis not present

## 2019-04-04 DIAGNOSIS — D631 Anemia in chronic kidney disease: Secondary | ICD-10-CM | POA: Diagnosis not present

## 2019-04-04 DIAGNOSIS — G4733 Obstructive sleep apnea (adult) (pediatric): Secondary | ICD-10-CM | POA: Diagnosis not present

## 2019-04-04 DIAGNOSIS — N183 Chronic kidney disease, stage 3 unspecified: Secondary | ICD-10-CM | POA: Diagnosis not present

## 2019-04-04 DIAGNOSIS — Z48812 Encounter for surgical aftercare following surgery on the circulatory system: Secondary | ICD-10-CM | POA: Diagnosis not present

## 2019-04-04 DIAGNOSIS — C61 Malignant neoplasm of prostate: Secondary | ICD-10-CM | POA: Diagnosis not present

## 2019-04-04 DIAGNOSIS — C9240 Acute promyelocytic leukemia, not having achieved remission: Secondary | ICD-10-CM | POA: Diagnosis not present

## 2019-04-04 DIAGNOSIS — I2510 Atherosclerotic heart disease of native coronary artery without angina pectoris: Secondary | ICD-10-CM | POA: Diagnosis not present

## 2019-04-04 DIAGNOSIS — M7751 Other enthesopathy of right foot: Secondary | ICD-10-CM | POA: Diagnosis not present

## 2019-04-04 DIAGNOSIS — L84 Corns and callosities: Secondary | ICD-10-CM | POA: Diagnosis not present

## 2019-04-04 DIAGNOSIS — D630 Anemia in neoplastic disease: Secondary | ICD-10-CM | POA: Diagnosis not present

## 2019-04-04 DIAGNOSIS — I69111 Memory deficit following nontraumatic intracerebral hemorrhage: Secondary | ICD-10-CM | POA: Diagnosis not present

## 2019-04-04 DIAGNOSIS — E875 Hyperkalemia: Secondary | ICD-10-CM | POA: Diagnosis not present

## 2019-04-04 DIAGNOSIS — R21 Rash and other nonspecific skin eruption: Secondary | ICD-10-CM | POA: Diagnosis not present

## 2019-04-04 DIAGNOSIS — M1030 Gout due to renal impairment, unspecified site: Secondary | ICD-10-CM | POA: Diagnosis not present

## 2019-04-04 DIAGNOSIS — I252 Old myocardial infarction: Secondary | ICD-10-CM | POA: Diagnosis not present

## 2019-04-04 DIAGNOSIS — I129 Hypertensive chronic kidney disease with stage 1 through stage 4 chronic kidney disease, or unspecified chronic kidney disease: Secondary | ICD-10-CM | POA: Diagnosis not present

## 2019-04-04 DIAGNOSIS — G4700 Insomnia, unspecified: Secondary | ICD-10-CM | POA: Diagnosis not present

## 2019-04-07 ENCOUNTER — Telehealth: Payer: Self-pay

## 2019-04-07 NOTE — Telephone Encounter (Signed)
David [name] PT with [facility] left v/m; pt had been seeing different [place] agency PT;last wk was the last wk for original order. David [name] first saw pt last wk and pt made some progress last wk. David [name] PT is requesting new verbal order for [facility] PT 1 x a wk for 4 wks to work on strengthening,balance and fall prevention.

## 2019-04-07 NOTE — Telephone Encounter (Signed)
Agree with this. Thanks.  

## 2019-04-08 ENCOUNTER — Other Ambulatory Visit: Payer: Self-pay | Admitting: Oncology

## 2019-04-08 DIAGNOSIS — C9241 Acute promyelocytic leukemia, in remission: Secondary | ICD-10-CM

## 2019-04-08 MED FILL — TRETINOIN 10 MG CAPS: 10 | 14 days supply | Qty: 140 | Fill #0

## 2019-04-08 NOTE — Telephone Encounter (Signed)
Spoke with David [name] informing him Dr. Darnell Level is giving verbal orders for services requested.

## 2019-04-09 ENCOUNTER — Other Ambulatory Visit: Payer: Self-pay

## 2019-04-09 ENCOUNTER — Other Ambulatory Visit: Payer: Self-pay | Admitting: Oncology

## 2019-04-10 ENCOUNTER — Other Ambulatory Visit: Payer: Self-pay

## 2019-04-10 ENCOUNTER — Inpatient Hospital Stay: Payer: Medicare Other | Attending: Oncology | Admitting: Oncology

## 2019-04-10 VITALS — BP 154/113 | HR 74 | Temp 96.7°F | Wt 202.6 lb

## 2019-04-10 DIAGNOSIS — C969 Malignant neoplasm of lymphoid, hematopoietic and related tissue, unspecified: Secondary | ICD-10-CM | POA: Diagnosis not present

## 2019-04-10 DIAGNOSIS — Z8546 Personal history of malignant neoplasm of prostate: Secondary | ICD-10-CM | POA: Diagnosis not present

## 2019-04-10 DIAGNOSIS — C9241 Acute promyelocytic leukemia, in remission: Secondary | ICD-10-CM

## 2019-04-10 DIAGNOSIS — C7931 Secondary malignant neoplasm of brain: Secondary | ICD-10-CM | POA: Diagnosis not present

## 2019-04-10 DIAGNOSIS — Z809 Family history of malignant neoplasm, unspecified: Secondary | ICD-10-CM | POA: Diagnosis not present

## 2019-04-10 DIAGNOSIS — R21 Rash and other nonspecific skin eruption: Secondary | ICD-10-CM

## 2019-04-10 DIAGNOSIS — C9240 Acute promyelocytic leukemia, not having achieved remission: Secondary | ICD-10-CM | POA: Diagnosis not present

## 2019-04-10 DIAGNOSIS — R9341 Abnormal radiologic findings on diagnostic imaging of renal pelvis, ureter, or bladder: Secondary | ICD-10-CM | POA: Diagnosis not present

## 2019-04-10 DIAGNOSIS — Z85820 Personal history of malignant melanoma of skin: Secondary | ICD-10-CM | POA: Insufficient documentation

## 2019-04-10 DIAGNOSIS — Z950 Presence of cardiac pacemaker: Secondary | ICD-10-CM | POA: Diagnosis not present

## 2019-04-10 DIAGNOSIS — C61 Malignant neoplasm of prostate: Secondary | ICD-10-CM | POA: Diagnosis not present

## 2019-04-10 DIAGNOSIS — I252 Old myocardial infarction: Secondary | ICD-10-CM

## 2019-04-10 DIAGNOSIS — Z87891 Personal history of nicotine dependence: Secondary | ICD-10-CM | POA: Insufficient documentation

## 2019-04-10 DIAGNOSIS — R110 Nausea: Secondary | ICD-10-CM | POA: Insufficient documentation

## 2019-04-10 DIAGNOSIS — I519 Heart disease, unspecified: Secondary | ICD-10-CM | POA: Diagnosis not present

## 2019-04-10 DIAGNOSIS — I10 Essential (primary) hypertension: Secondary | ICD-10-CM

## 2019-04-10 DIAGNOSIS — C3490 Malignant neoplasm of unspecified part of unspecified bronchus or lung: Secondary | ICD-10-CM | POA: Diagnosis not present

## 2019-04-10 DIAGNOSIS — D630 Anemia in neoplastic disease: Secondary | ICD-10-CM | POA: Diagnosis not present

## 2019-04-14 ENCOUNTER — Other Ambulatory Visit: Payer: Self-pay

## 2019-04-14 ENCOUNTER — Inpatient Hospital Stay: Payer: Medicare Other

## 2019-04-14 ENCOUNTER — Inpatient Hospital Stay: Payer: Medicare Other | Attending: Oncology

## 2019-04-14 VITALS — BP 137/82 | HR 75 | Temp 97.7°F | Resp 18 | Wt 202.0 lb

## 2019-04-14 DIAGNOSIS — C9241 Acute promyelocytic leukemia, in remission: Secondary | ICD-10-CM

## 2019-04-14 DIAGNOSIS — Z5111 Encounter for antineoplastic chemotherapy: Secondary | ICD-10-CM | POA: Insufficient documentation

## 2019-04-14 DIAGNOSIS — Z01810 Encounter for preprocedural cardiovascular examination: Secondary | ICD-10-CM | POA: Diagnosis not present

## 2019-04-14 LAB — CBC WITH DIFFERENTIAL/PLATELET
Abs Immature Granulocytes: 0.06 10*3/uL (ref 0.00–0.07)
Basophils Absolute: 0 10*3/uL (ref 0.0–0.1)
Basophils Relative: 0 %
Eosinophils Absolute: 0 10*3/uL (ref 0.0–0.5)
Eosinophils Relative: 1 %
HCT: 35.8 % — ABNORMAL LOW (ref 39.0–52.0)
Hemoglobin: 11.5 g/dL — ABNORMAL LOW (ref 13.0–17.0)
Immature Granulocytes: 1 %
Lymphocytes Relative: 7 %
Lymphs Abs: 0.4 10*3/uL — ABNORMAL LOW (ref 0.7–4.0)
MCH: 33.2 pg (ref 26.0–34.0)
MCHC: 32.1 g/dL (ref 30.0–36.0)
MCV: 103.5 fL — ABNORMAL HIGH (ref 80.0–100.0)
Monocytes Absolute: 0.3 10*3/uL (ref 0.1–1.0)
Monocytes Relative: 6 %
Neutro Abs: 5.3 10*3/uL (ref 1.7–7.7)
Neutrophils Relative %: 85 %
Platelets: 175 10*3/uL (ref 150–400)
RBC: 3.46 MIL/uL — ABNORMAL LOW (ref 4.22–5.81)
RDW: 17.2 % — ABNORMAL HIGH (ref 11.5–15.5)
WBC: 6.1 10*3/uL (ref 4.0–10.5)
nRBC: 0 % (ref 0.0–0.2)

## 2019-04-14 LAB — COMPREHENSIVE METABOLIC PANEL
ALT: 14 U/L (ref 0–44)
AST: 19 U/L (ref 15–41)
Albumin: 3.5 g/dL (ref 3.5–5.0)
Alkaline Phosphatase: 49 U/L (ref 38–126)
Anion gap: 9 (ref 5–15)
BUN: 50 mg/dL — ABNORMAL HIGH (ref 8–23)
CO2: 21 mmol/L — ABNORMAL LOW (ref 22–32)
Calcium: 8.6 mg/dL — ABNORMAL LOW (ref 8.9–10.3)
Chloride: 103 mmol/L (ref 98–111)
Creatinine, Ser: 1.66 mg/dL — ABNORMAL HIGH (ref 0.61–1.24)
GFR calc Af Amer: 45 mL/min — ABNORMAL LOW (ref >60)
GFR calc non Af Amer: 39 mL/min — ABNORMAL LOW (ref >60)
Glucose, Bld: 138 mg/dL — ABNORMAL HIGH (ref 70–99)
Potassium: 5.5 mmol/L — ABNORMAL HIGH (ref 3.5–5.1)
Sodium: 133 mmol/L — ABNORMAL LOW (ref 135–145)
Total Bilirubin: 0.5 mg/dL (ref 0.3–1.2)
Total Protein: 5.9 g/dL — ABNORMAL LOW (ref 6.5–8.1)

## 2019-04-14 MED ORDER — SODIUM CHLORIDE 0.9 % IV SOLN
10.0000 mg | Freq: Once | INTRAVENOUS | Status: AC
  Administered 2019-04-14: 14:00:00 10 mg via INTRAVENOUS
  Filled 2019-04-14: qty 10

## 2019-04-14 MED ORDER — DEXAMETHASONE SODIUM PHOSPHATE 10 MG/ML IJ SOLN
INTRAMUSCULAR | Status: AC
  Filled 2019-04-14: qty 1

## 2019-04-14 MED ORDER — SODIUM CHLORIDE 0.9 % IV SOLN
Freq: Once | Status: AC
  Filled 2019-04-14: qty 250

## 2019-04-14 MED ORDER — HEPARIN SOD (PORK) LOCK FLUSH 100 UNIT/ML IV SOLN
500.0000 [IU] | Freq: Once | INTRAVENOUS | Status: AC | PRN
  Administered 2019-04-14: 17:00:00 500 [IU]
  Filled 2019-04-14: qty 5

## 2019-04-14 MED ORDER — SODIUM CHLORIDE 0.9 % IV SOLN
Freq: Once | INTRAVENOUS | Status: AC
  Filled 2019-04-14: qty 250

## 2019-04-14 MED ORDER — SODIUM CHLORIDE 0.9% FLUSH
10.0000 mL | INTRAVENOUS | Status: DC | PRN
  Administered 2019-04-14: 14:00:00 10 mL
  Filled 2019-04-14: qty 10

## 2019-04-14 MED ORDER — HEPARIN SOD (PORK) LOCK FLUSH 100 UNIT/ML IV SOLN
INTRAVENOUS | Status: AC
  Filled 2019-04-14: qty 5

## 2019-04-14 MED ORDER — SODIUM CHLORIDE 0.9 % IV SOLN: Freq: Once | INTRAVENOUS | Status: DC

## 2019-04-14 NOTE — Progress Notes (Addendum)
Pt denies any concerns, complaints or questions at this time. No s/s of distress noted. Pt and VS stable at discharge.

## 2019-04-14 NOTE — Progress Notes (Signed)
Patient being started on Arsenic for acute promyelocytic leukemia.   Per MD note, pts baseline QTc is 560, and although arsenic not recommended if QTc greater than 500, will continue since this is his baseline with weekly ECG. Also, K is 5.5 today and ok to treat per MD.   Patient also takes citalopram 10mg  at home, which can increase QT interval. This is most pronounced with doses of 20 or 40mg , however may consider discontinuing citalopram if possible due to this increase risk of QT prolongation.

## 2019-04-15 ENCOUNTER — Telehealth: Payer: Self-pay

## 2019-04-15 ENCOUNTER — Ambulatory Visit (INDEPENDENT_AMBULATORY_CARE_PROVIDER_SITE_OTHER): Payer: Medicare Other | Admitting: *Deleted

## 2019-04-15 ENCOUNTER — Other Ambulatory Visit: Payer: Self-pay

## 2019-04-15 ENCOUNTER — Telehealth: Payer: Self-pay | Admitting: Emergency Medicine

## 2019-04-15 ENCOUNTER — Inpatient Hospital Stay: Payer: Medicare Other

## 2019-04-15 VITALS — BP 112/74 | HR 76 | Temp 97.1°F | Resp 18

## 2019-04-15 DIAGNOSIS — Z950 Presence of cardiac pacemaker: Secondary | ICD-10-CM

## 2019-04-15 DIAGNOSIS — I495 Sick sinus syndrome: Secondary | ICD-10-CM | POA: Diagnosis not present

## 2019-04-15 DIAGNOSIS — I442 Atrioventricular block, complete: Secondary | ICD-10-CM

## 2019-04-15 DIAGNOSIS — C9241 Acute promyelocytic leukemia, in remission: Secondary | ICD-10-CM | POA: Diagnosis not present

## 2019-04-15 DIAGNOSIS — Z5111 Encounter for antineoplastic chemotherapy: Secondary | ICD-10-CM | POA: Diagnosis not present

## 2019-04-15 LAB — BASIC METABOLIC PANEL
Anion gap: 8 (ref 5–15)
BUN: 57 mg/dL — ABNORMAL HIGH (ref 8–23)
CO2: 22 mmol/L (ref 22–32)
Calcium: 8.8 mg/dL — ABNORMAL LOW (ref 8.9–10.3)
Chloride: 103 mmol/L (ref 98–111)
Creatinine, Ser: 1.72 mg/dL — ABNORMAL HIGH (ref 0.61–1.24)
GFR calc Af Amer: 43 mL/min — ABNORMAL LOW (ref >60)
GFR calc non Af Amer: 37 mL/min — ABNORMAL LOW (ref >60)
Glucose, Bld: 115 mg/dL — ABNORMAL HIGH (ref 70–99)
Potassium: 4.8 mmol/L (ref 3.5–5.1)
Sodium: 133 mmol/L — ABNORMAL LOW (ref 135–145)

## 2019-04-15 MED ORDER — SODIUM CHLORIDE 0.9 % IV SOLN
Freq: Once | INTRAVENOUS | Status: AC
  Filled 2019-04-15: qty 250

## 2019-04-15 MED ORDER — HEPARIN SOD (PORK) LOCK FLUSH 100 UNIT/ML IV SOLN
500.0000 [IU] | Freq: Once | INTRAVENOUS | Status: AC | PRN
  Administered 2019-04-15: 16:00:00 500 [IU]
  Filled 2019-04-15: qty 5

## 2019-04-15 MED ORDER — SODIUM CHLORIDE 0.9 % IV SOLN
10.0000 mg | Freq: Once | INTRAVENOUS | Status: AC
  Administered 2019-04-15: 13:00:00 10 mg via INTRAVENOUS
  Filled 2019-04-15: qty 1

## 2019-04-15 MED ORDER — PALONOSETRON HCL INJECTION 0.25 MG/5ML
0.2500 mg | Freq: Once | INTRAVENOUS | Status: AC
  Administered 2019-04-15: 12:00:00 0.25 mg via INTRAVENOUS
  Filled 2019-04-15: qty 5

## 2019-04-15 MED ORDER — HEPARIN SOD (PORK) LOCK FLUSH 100 UNIT/ML IV SOLN
INTRAVENOUS | Status: AC
  Filled 2019-04-15: qty 5

## 2019-04-15 MED FILL — Sodium Chloride IV Soln 0.9%: INTRAVENOUS | Qty: 250 | Status: AC

## 2019-04-15 MED FILL — Arsenic Trioxide IV Soln 10 MG/10ML (1 MG/ML): INTRAVENOUS | Qty: 13.3 | Status: AC

## 2019-04-15 MED FILL — Arsenic Trioxide IV Soln 10 MG/10ML (1 MG/ML): INTRAVENOUS | Qty: 6 | Status: CN

## 2019-04-15 NOTE — Telephone Encounter (Signed)
Pt daughter states he has cancer treatment today at another hospital. They wanted his wound check appointment to be virtual visit. I asked the nurse Raquel Sarna and she states that will be fine. The pt has an Iphone. I told her Jenny Reichmann, RN will call first and then face time him for the appointment. The phone number to be used for that appointment is [phone].

## 2019-04-15 NOTE — Telephone Encounter (Signed)
Attempting to contact patient to confirm video visit and to have patient remove steri-strips with warm soap and water before call at 1530.

## 2019-04-15 NOTE — Progress Notes (Signed)
At 1205 MD made aware of the pt.'s BMP results and that the pt states that he took "5 nausea pills prior to his appointment". Verbal order to proceed with treatment and proceed with all premedications. VSS.   At 1510 Arsenic infusion was completed. Pt tolerated infusion well and displays no signs of complications or distress. Pt left accessed per request with dressing reinforced and secured. Towards the end of pt.'s infusion, he begins to express frustration that he can not remember certain task. RN provided reassurance. Pt stable for discharge. RN escorted pt out to the car where he was greeted by his sister to drive him home.   David [name] [employer]

## 2019-04-16 ENCOUNTER — Other Ambulatory Visit: Payer: Self-pay

## 2019-04-16 ENCOUNTER — Inpatient Hospital Stay: Payer: Medicare Other

## 2019-04-16 ENCOUNTER — Ambulatory Visit: Payer: Medicare Other

## 2019-04-16 VITALS — BP 107/66 | HR 80 | Temp 97.0°F | Resp 18

## 2019-04-16 DIAGNOSIS — C9241 Acute promyelocytic leukemia, in remission: Secondary | ICD-10-CM

## 2019-04-16 DIAGNOSIS — Z5111 Encounter for antineoplastic chemotherapy: Secondary | ICD-10-CM | POA: Diagnosis not present

## 2019-04-16 MED ORDER — SODIUM CHLORIDE 0.9 % IV SOLN
10.0000 mg | Freq: Once | INTRAVENOUS | Status: AC
  Administered 2019-04-16: 10 mg via INTRAVENOUS
  Filled 2019-04-16: qty 10

## 2019-04-16 MED ORDER — HEPARIN SOD (PORK) LOCK FLUSH 100 UNIT/ML IV SOLN
500.0000 [IU] | Freq: Once | INTRAVENOUS | Status: AC | PRN
  Administered 2019-04-16: 15:00:00 500 [IU]
  Filled 2019-04-16: qty 5

## 2019-04-16 MED ORDER — SODIUM CHLORIDE 0.9 % IV SOLN
Freq: Once | INTRAVENOUS | Status: AC
  Filled 2019-04-16: qty 250

## 2019-04-17 ENCOUNTER — Other Ambulatory Visit: Payer: Self-pay | Admitting: Oncology

## 2019-04-17 ENCOUNTER — Other Ambulatory Visit: Payer: Self-pay

## 2019-04-17 ENCOUNTER — Ambulatory Visit (INDEPENDENT_AMBULATORY_CARE_PROVIDER_SITE_OTHER): Payer: Medicare Other | Admitting: *Deleted

## 2019-04-17 ENCOUNTER — Inpatient Hospital Stay: Payer: Medicare Other

## 2019-04-17 ENCOUNTER — Ambulatory Visit: Payer: Medicare Other

## 2019-04-17 ENCOUNTER — Telehealth: Payer: Self-pay | Admitting: Emergency Medicine

## 2019-04-17 VITALS — BP 128/77 | HR 76 | Temp 96.1°F | Resp 20

## 2019-04-17 DIAGNOSIS — Z5111 Encounter for antineoplastic chemotherapy: Secondary | ICD-10-CM | POA: Diagnosis not present

## 2019-04-17 DIAGNOSIS — C9241 Acute promyelocytic leukemia, in remission: Secondary | ICD-10-CM | POA: Diagnosis not present

## 2019-04-17 DIAGNOSIS — I442 Atrioventricular block, complete: Secondary | ICD-10-CM

## 2019-04-17 LAB — CUP PACEART INCLINIC DEVICE CHECK
Battery Remaining Longevity: 70 mo
Battery Voltage: 2.99 V
Brady Statistic RA Percent Paced: 93 %
Brady Statistic RV Percent Paced: 99.99 %
Date Time Interrogation Session: 20210107161800
Implantable Lead Implant Date: 19980107
Implantable Lead Implant Date: 19980107
Implantable Lead Implant Date: 20141118
Implantable Lead Location: 753858
Implantable Lead Location: 753859
Implantable Lead Location: 753860
Implantable Pulse Generator Implant Date: 20201223
Lead Channel Impedance Value: 350 Ohm
Lead Channel Impedance Value: 412.5 Ohm
Lead Channel Impedance Value: 825 Ohm
Lead Channel Pacing Threshold Amplitude: 0.75 V
Lead Channel Pacing Threshold Amplitude: 1 V
Lead Channel Pacing Threshold Amplitude: 1.125 V
Lead Channel Pacing Threshold Pulse Width: 0.5 ms
Lead Channel Pacing Threshold Pulse Width: 0.5 ms
Lead Channel Pacing Threshold Pulse Width: 1 ms
Lead Channel Sensing Intrinsic Amplitude: 3.6 mV
Lead Channel Setting Pacing Amplitude: 2 V
Lead Channel Setting Pacing Amplitude: 2.125
Lead Channel Setting Pacing Amplitude: 2.5 V
Lead Channel Setting Pacing Pulse Width: 0.5 ms
Lead Channel Setting Pacing Pulse Width: 1 ms
Lead Channel Setting Sensing Sensitivity: 4 mV
Pulse Gen Serial Number: 6033885

## 2019-04-17 MED ORDER — SODIUM CHLORIDE 0.9 % IV SOLN
10.0000 mg | Freq: Once | INTRAVENOUS | Status: AC
  Administered 2019-04-17: 11:00:00 10 mg via INTRAVENOUS
  Filled 2019-04-17: qty 10

## 2019-04-17 MED ORDER — HEPARIN SOD (PORK) LOCK FLUSH 100 UNIT/ML IV SOLN
500.0000 [IU] | Freq: Once | INTRAVENOUS | Status: AC | PRN
  Administered 2019-04-17: 14:00:00 500 [IU]
  Filled 2019-04-17: qty 5

## 2019-04-17 MED ORDER — SODIUM CHLORIDE 0.9 % IV SOLN
13.3000 mg | Freq: Once | Status: AC
  Administered 2019-04-17: 12:00:00 13.3 mg via INTRAVENOUS
  Filled 2019-04-17: qty 250

## 2019-04-17 MED ORDER — PALONOSETRON HCL INJECTION 0.25 MG/5ML
0.2500 mg | Freq: Once | INTRAVENOUS | Status: AC
  Administered 2019-04-17: 0.25 mg via INTRAVENOUS
  Filled 2019-04-17: qty 5

## 2019-04-17 MED ORDER — SODIUM CHLORIDE 0.9% FLUSH
10.0000 mL | INTRAVENOUS | Status: DC | PRN
  Administered 2019-04-17: 11:00:00 10 mL
  Filled 2019-04-17: qty 10

## 2019-04-17 MED ORDER — SODIUM CHLORIDE 0.9 % IV SOLN
Freq: Once | INTRAVENOUS | Status: AC
  Filled 2019-04-17: qty 250

## 2019-04-17 MED ORDER — SODIUM CHLORIDE 0.9 % IV SOLN: Freq: Once | Status: DC

## 2019-04-17 NOTE — Telephone Encounter (Signed)
Wife made aware that Dr Lovena Le wants the patient to hold his 81 mg ASA x  1 month. [name]) was with patient at appointment this afternoon and aware ASA 81 mg is to be stopped for 1 month too.

## 2019-04-17 NOTE — Progress Notes (Signed)
Pacemaker check in clinic. Normal device function. Thresholds, sensing, impedances consistent with previous measurements. Device programmed to maximize longevity. No mode switch or high ventricular rates noted. Device programmed at appropriate safety margins. Histogram distribution appropriate for patient activity level. Device programmed to optimize intrinsic conduction. Estimated longevity 5 yrs 10 months. Patient enrolled in remote follow-up 07/01/19. Follow up with [name] 07/08/19 in [place]. Patient education completed.

## 2019-04-17 NOTE — Progress Notes (Signed)
  Video visit with patient permission and photo taken. Steri strips were removed prior to visit and wound reported to have no drainage or redness. Edema noted at wound site and bruising noted. Patient scheduled to be seen in clinic to evaluate site due to edema present. Recommended ice pack be applied to wound site to decrease edema.

## 2019-04-18 ENCOUNTER — Inpatient Hospital Stay: Payer: Medicare Other

## 2019-04-18 ENCOUNTER — Ambulatory Visit: Payer: Medicare Other

## 2019-04-18 ENCOUNTER — Other Ambulatory Visit: Payer: Self-pay

## 2019-04-18 ENCOUNTER — Encounter: Payer: Self-pay | Admitting: Oncology

## 2019-04-18 VITALS — BP 114/74 | HR 74 | Temp 97.0°F | Resp 18

## 2019-04-18 DIAGNOSIS — C9241 Acute promyelocytic leukemia, in remission: Secondary | ICD-10-CM | POA: Diagnosis not present

## 2019-04-18 DIAGNOSIS — Z5111 Encounter for antineoplastic chemotherapy: Secondary | ICD-10-CM | POA: Diagnosis not present

## 2019-04-18 MED ORDER — PALONOSETRON HCL INJECTION 0.25 MG/5ML
0.2500 mg | Freq: Once | INTRAVENOUS | Status: AC
  Administered 2019-04-18: 11:00:00 0.25 mg via INTRAVENOUS
  Filled 2019-04-18: qty 5

## 2019-04-18 MED ORDER — SODIUM CHLORIDE 0.9 % IV SOLN
Freq: Once | INTRAVENOUS | Status: AC
  Filled 2019-04-18: qty 250

## 2019-04-18 MED ORDER — SODIUM CHLORIDE 0.9 % IV SOLN
10.0000 mg | Freq: Once | INTRAVENOUS | Status: AC
  Administered 2019-04-18: 11:00:00 10 mg via INTRAVENOUS
  Filled 2019-04-18: qty 10

## 2019-04-18 MED ORDER — HEPARIN SOD (PORK) LOCK FLUSH 100 UNIT/ML IV SOLN
500.0000 [IU] | Freq: Once | INTRAVENOUS | Status: AC | PRN
  Administered 2019-04-18: 500 [IU]
  Filled 2019-04-18: qty 5

## 2019-04-18 MED ORDER — SODIUM CHLORIDE 0.9 % IV SOLN: Freq: Once | Status: DC

## 2019-04-18 MED ORDER — SODIUM CHLORIDE 0.9 % IV SOLN
13.3000 mg | Freq: Once | Status: AC
  Administered 2019-04-18: 11:00:00 13.3 mg via INTRAVENOUS
  Filled 2019-04-18: qty 250

## 2019-04-18 MED FILL — Arsenic Trioxide IV Soln 10 MG/10ML (1 MG/ML): INTRAVENOUS | Qty: 13.3 | Status: AC

## 2019-04-18 MED FILL — Arsenic Trioxide IV Soln 10 MG/10ML (1 MG/ML): INTRAVENOUS | Qty: 6.7 | Status: AC

## 2019-04-18 MED FILL — Sodium Chloride IV Soln 0.9%: INTRAVENOUS | Qty: 250 | Status: AC

## 2019-04-19 DIAGNOSIS — C61 Malignant neoplasm of prostate: Secondary | ICD-10-CM | POA: Diagnosis not present

## 2019-04-19 DIAGNOSIS — C969 Malignant neoplasm of lymphoid, hematopoietic and related tissue, unspecified: Secondary | ICD-10-CM | POA: Diagnosis not present

## 2019-04-19 DIAGNOSIS — C9240 Acute promyelocytic leukemia, not having achieved remission: Secondary | ICD-10-CM | POA: Diagnosis not present

## 2019-04-19 DIAGNOSIS — C3490 Malignant neoplasm of unspecified part of unspecified bronchus or lung: Secondary | ICD-10-CM | POA: Diagnosis not present

## 2019-04-19 DIAGNOSIS — C7931 Secondary malignant neoplasm of brain: Secondary | ICD-10-CM | POA: Diagnosis not present

## 2019-04-19 DIAGNOSIS — D630 Anemia in neoplastic disease: Secondary | ICD-10-CM | POA: Diagnosis not present

## 2019-04-19 NOTE — Progress Notes (Signed)
[place]  Telephone:([phone] [phone]  ID: David [name]. OB: Dec 11, 1940  MR#: [id number]  GUY#:[id number]  Patient Care Team: [name], MD as PCP - General (Family Medicine) Cira Rue, RN Nurse Navigator as Registered Nurse (Medical Oncology) Acquanetta Chain, DO as Consulting Physician [facility] and Palliative Medicine) [name], MD as Consulting Physician (Hematology and Oncology)  CHIEF COMPLAINT: Acute promyelocytic leukemia in remission  INTERVAL HISTORY: Patient returns to clinic today for further evaluation and consideration of cycle 1, day 8 of arsenic and ATRA.  Patient had increased weakness and fatigue this past week, but otherwise tolerated his treatments well.  He has chronic nausea, but states this is well controlled with his current medications.  He has a fair appetite.  He denies any recent fevers or illnesses.  He has no neurologic complaints.  He denies any chest pain, shortness of breath, cough, or hemoptysis.  He denies any nausea, vomiting, constipation, or diarrhea.  He has no urinary complaints.  Patient offers no further specific complaints today.  REVIEW OF SYSTEMS:   Review of Systems  Constitutional: Positive for malaise/fatigue. Negative for fever and weight loss.  Respiratory: Negative.  Negative for cough, hemoptysis and shortness of breath.   Cardiovascular: Negative.  Negative for chest pain and leg swelling.  Gastrointestinal: Negative.  Negative for abdominal pain and nausea.  Genitourinary: Negative.  Negative for hematuria.  Musculoskeletal: Negative.  Negative for back pain.  Skin: Negative.  Negative for rash.  Neurological: Positive for weakness. Negative for dizziness, focal weakness and headaches.  Psychiatric/Behavioral: Negative.  The patient is not nervous/anxious.     As per HPI. Otherwise, a complete review of systems is negative.  PAST MEDICAL HISTORY: Past Medical  History:  Diagnosis Date  . Anginal pain ([place])   . Arthritis    mostly hands  . Benign familial hematuria   . BPH (benign prostatic hypertrophy)   . Brain cancer ([place])    melanoma with brain met  . Coronary artery disease   . GERD (gastroesophageal reflux disease)   . High cholesterol   . Hypertension   . Hypothyroidism   . Intracerebral hemorrhage ([place])   . Melanoma ([place])   . Metastatic melanoma to lung ([place])   . Mood disorder ([place])   . Myocardial infarction ([place])   . Pacemaker    due to syncope, 3rd degree HB (upgrade to [facility]. Jude CRT-P 02/25/13 (Dr. Uvaldo Rising)  . Rectal bleed    due to NSAIDS  . Renal disorder   . Sepsis ([place]) 12/09/2018  . Skin cancer    melanoma  . Sleep apnea    uses CPAP  . Stroke [facility])     PAST SURGICAL HISTORY: Past Surgical History:  Procedure Laterality Date  . APPLICATION OF CRANIAL NAVIGATION N/A 10/15/2015   Procedure: APPLICATION OF CRANIAL NAVIGATION;  Surgeon: Erline Levine, MD;  Location: [place] NEURO ORS;  Service: Neurosurgery;  Laterality: N/A;  . BIV PACEMAKER GENERATOR CHANGEOUT N/A 04/02/2019   Procedure: BIV PACEMAKER GENERATOR CHANGEOUT;  Surgeon: Evans Lance, MD;  Location: [place] CV LAB;  Service: Cardiovascular;  Laterality: N/A;  . CARDIAC CATHETERIZATION  2013  . CARDIAC SURGERY     bypass X 2  . COLONOSCOPY WITH PROPOFOL N/A 05/08/2016   diverticulosis, int hem, no f/u needed [name])  . CORONARY ARTERY BYPASS GRAFT  2013   LIMA-LAD, SVG-PDA 03/27/11 (Dr. Francee Gentile)  . CRANIOTOMY N/A 10/15/2015   Procedure: LEFT FRONTAL CRANIOTOMY TUMOR  EXCISION with Curve;  Surgeon: Erline Levine, MD;  Location: [place] NEURO ORS;  Service: Neurosurgery;  Laterality: N/A;  CRANIOTOMY TUMOR EXCISION  . EYE SURGERY    . FOOT SURGERY  08/2010  . JOINT REPLACEMENT Left    partial knee  . KNEE ARTHROSCOPY  12/2008  . LYMPH NODE BIOPSY    . PACEMAKER INSERTION    . TONSILLECTOMY      FAMILY HISTORY: Family History  Problem Relation Age of Onset    . Cancer Mother 21       lung   . Hypertension Mother   . Hypertension Father   . Heart attack Father   . Heart disease Father   . Rheum arthritis Sister   . Cancer Maternal Grandmother     ADVANCED DIRECTIVES (Y/N):  N  HEALTH MAINTENANCE: Social History   Tobacco Use  . Smoking status: Former Smoker    Packs/day: 1.00    Years: 3.00    Pack years: 3.00    Quit date: 04/10/1957    Years since quitting: 62.0  . Smokeless tobacco: Never Used  Substance Use Topics  . Alcohol use: Yes    Alcohol/week: 14.0 standard drinks    Types: 10 Glasses of wine, 4 Cans of beer per week  . Drug use: No     Colonoscopy:  PAP:  Bone density:  Lipid panel:  Allergies  Allergen Reactions  . Ciprofloxacin Other (See Comments)    Tendonitis  . Ezetimibe Other (See Comments)    Unknown reaction  . Simvastatin Other (See Comments)    Reaction:  Gave pt a fever  Fever - temp of 103.   In 2003    Current Outpatient Medications  Medication Sig Dispense Refill  . acetaminophen (TYLENOL) 325 MG tablet Take 2 tablets (650 mg total) by mouth every 6 (six) hours as needed for mild pain (or Fever >/= 101).    Marland Kitchen acyclovir (ZOVIRAX) 400 MG tablet Take 400 mg by mouth 2 (two) times daily.    Marland Kitchen allopurinol (ZYLOPRIM) 100 MG tablet Take 2 tablets (200 mg total) by mouth daily. 180 tablet 1  . Blood Glucose Monitoring Suppl (ONETOUCH VERIO) w/Device KIT 1 kit by Does not apply route as directed. E11.65 1 kit 0  . carboxymethylcellulose (REFRESH TEARS) 0.5 % SOLN Apply to eye.    . Cholecalciferol (VITAMIN D3) 25 MCG (1000 UT) CAPS Take 1 capsule (1,000 Units total) by mouth daily. 30 capsule 0  . citalopram (CELEXA) 10 MG tablet Take 1 tablet (10 mg total) by mouth daily. 90 tablet 1  . colchicine 0.6 MG tablet Take 1 tablet (0.6 mg total) by mouth daily as needed (gout flare). Take 2 tablets on the first day of flare 30 tablet 0  . glucose blood test strip E11.65 Use as instructed to check sugars  three time daily and as needed 100 each 12  . HUMALOG KWIKPEN 100 UNIT/ML KwikPen     . hydrocortisone cream 1 % Apply topically.    . insulin aspart (NOVOLOG FLEXPEN) 100 UNIT/ML FlexPen Use three times daily with meals as needed per sliding scale: 2 units for every 50 units over 150 (2u for 150-199, 4u for 200-249, 6u for 250-299, etc) 3 mL 1  . ketoconazole (NIZORAL) 2 % shampoo Apply topically.    . Lancets (ONETOUCH ULTRASOFT) lancets E11.65 Use as instructed to check sugars three time daily and as needed 100 each 12  . levothyroxine (SYNTHROID) 137 MCG tablet TAKE 1  TABLET BY MOUTH ONCE DAILY BEFORE BREAKFAST (Patient taking differently: Take 137 mcg by mouth daily before breakfast. ) 30 tablet 11  . methylphenidate (RITALIN) 5 MG tablet Take 1 tablet (5 mg total) by mouth 2 (two) times daily with breakfast and lunch. 60 tablet 0  . metoprolol succinate (TOPROL-XL) 50 MG 24 hr tablet TAKE 1 AND 1/2 TABLET (75 MG) BY MOUTH EVERY DAY WITH OR IMMEDIATELY FOLLOWING A MEAL *DO NOT CRUSH* (Patient taking differently: Take 50 mg by mouth daily. ) 50 tablet 11  . mineral oil-hydrophilic petrolatum (AQUAPHOR) ointment Apply topically.    . ondansetron (ZOFRAN-ODT) 4 MG disintegrating tablet     . polyethylene glycol (MIRALAX / GLYCOLAX) 17 g packet Take 17 g by mouth 2 (two) times daily. (Patient taking differently: Take 17 g by mouth daily. ) 14 each 0  . predniSONE (DELTASONE) 5 MG tablet Take 10 mg by mouth daily.     Marland Kitchen senna (SENOKOT) 8.6 MG TABS tablet Take 1 tablet (8.6 mg total) by mouth at bedtime. 120 tablet 0  . senna-docusate (SENOKOT-S) 8.6-50 MG tablet Take by mouth.    . sodium chloride (ALTAMIST SPRAY) 0.65 % nasal spray Place into the nose.    . testosterone cypionate (DEPOTESTOSTERONE CYPIONATE) 200 MG/ML injection Inject 100 mg into the muscle every 14 (fourteen) days.    . traZODone (DESYREL) 50 MG tablet Take 0.5-1 tablets (25-50 mg total) by mouth at bedtime. (Patient taking  differently: Take 25 mg by mouth at bedtime as needed for sleep. ) 30 tablet 1  . tretinoin (VESANOID) 10 MG capsule     . Turmeric Curcumin 500 MG CAPS Take by mouth.    Marland Kitchen aspirin EC 81 MG tablet Take 81 mg by mouth every evening.      No current facility-administered medications for this visit.   Facility-Administered Medications Ordered in Other Visits  Medication Dose Route Frequency Provider Last Rate Last Admin  . 0.9 %  sodium chloride infusion  1,000 mL Intravenous Once [name], MD        OBJECTIVE: Vitals:   04/21/19 1020  BP: (!) 135/92  Pulse: 98  Resp: 20  Temp: 98 F (36.7 C)  SpO2: 97%     Body mass index is 30.22 kg/m.    ECOG FS:1 - Symptomatic but completely ambulatory  General: Well-developed, well-nourished, no acute distress. Eyes: Pink conjunctiva, anicteric sclera. HEENT: Normocephalic, moist mucous membranes. Chest wall: Biventricular pacer noted with surrounding ecchymosis.  No obvious erythema or warmth.  Port-A-Cath within normal limits. Lungs: No audible wheezing or coughing. Heart: Regular rate and rhythm. Abdomen: Soft, nontender, no obvious distention. Musculoskeletal: No edema, cyanosis, or clubbing. Neuro: Alert, answering all questions appropriately. Cranial nerves grossly intact. Skin: No rashes or petechiae noted. Psych: Normal affect.   LAB RESULTS:  Lab Results  Component Value Date   NA 137 04/21/2019   K 4.7 04/21/2019   CL 107 04/21/2019   CO2 22 04/21/2019   GLUCOSE 109 (H) 04/21/2019   BUN 42 (H) 04/21/2019   CREATININE 1.51 (H) 04/21/2019   CALCIUM 8.7 (L) 04/21/2019   PROT 5.6 (L) 04/21/2019   ALBUMIN 3.3 (L) 04/21/2019   AST 32 04/21/2019   ALT 27 04/21/2019   ALKPHOS 51 04/21/2019   BILITOT 0.5 04/21/2019   GFRNONAA 43 (L) 04/21/2019   GFRAA 50 (L) 04/21/2019    Lab Results  Component Value Date   WBC 3.9 (L) 04/21/2019   NEUTROABS 3.3 04/21/2019  HGB 11.4 (L) 04/21/2019   HCT 35.8 (L) 04/21/2019   MCV  104.1 (H) 04/21/2019   PLT 197 04/21/2019     STUDIES: EP PPM/ICD IMPLANT  Result Date: 04/02/2019 Conclusion: successful Biv PPM gen change out in a patient with CHB and chronic systolic heart failure, s/p PPM insertion with the old device at [facility]. Gregg Taylor,M.D.  CUP PACEART INCLINIC DEVICE CHECK  Result Date: 04/17/2019 Pacemaker check in clinic. Normal device function. Thresholds, sensing, impedances consistent with previous measurements. Device programmed to maximize longevity. No mode switch or high ventricular rates noted. Device programmed at appropriate safety margins. Histogram distribution appropriate for patient activity level. Device programmed to optimize intrinsic conduction. Estimated longevity 5 yrs 10 months. Patient enrolled in remote follow-up 07/01/19. Follow up with [name] 07/08/19 in [place]. Patient education completed. Pacemaker check in clinic. Normal device function. Thresholds, sensing, impedances consistent with previous measurements. Device programmed to maximize longevity. No mode switch or high ventricular rates noted. Device programmed at appropriate safety margins. Histogram distribution appropriate for patient activity level. Device programmed to optimize intrinsic conduction. Estimated longevity 5 yrs 10 months. Patient enrolled in remote follow-up 07/01/19. Follow up with [name] 07/08/19 in [place]. Patient education completed.   ASSESSMENT: Acute promyelocytic leukemia in remission  PLAN:    1. Acute promyelocytic leukemia in remission: Patient completed his induction chemotherapy at [facility] in approximately November 2020.  Peripheral blood FISH-PML t(15:17) noted complete molecular remission.  Continue with maintenance chemotherapy using ATRA 14 days on 14 days off along with arsenic trioxide at 0.15 mg/kg/day Monday through Friday for 4 weeks then 4 weeks off.  Each cycle will be a total of 8 weeks.  Pending patient's toleration of treatment,  will plan to do a total of 4 cycles or 32 weeks of treatment.  Proceed with cycle 1, day 8 of arsenic today.  Return to clinic Tuesday through Friday for arsenic only.  Patient will then return to clinic in 1 week for further evaluation and consideration of cycle 15, day 1.  2.  Nausea: Continue ondansetron as needed. 3.  Rash: Continue topical treatments as directed.  Can also consider prednisone taper if necessary. 4.  Cardiac disease: Given patient's biventricular pacing, he has a significantly abnormal EKG with a prolonged QTC of approximately 560.  Although treatment should be discontinued with a QTC greater than 500, this is patient's baseline and will continue to monitor with weekly EKGs.  Continue close follow-up with cardiology as scheduled. 5.  Melanoma: No obvious evidence of recurrence.  Monitor. 6.  Prostate cancer: Patient's most recent PCA in June 2020 was reported at 4.35. 7.  Biventricular pacer: Recently evaluated.  Patient has underlying hematoma with surrounding ecchymoses.  Proceed with treatment as above. 8.  Weakness and fatigue: Likely multifactorial.  Will add an additional 1 L of fluids on Mondays and Thursdays of each treatment. 9.  Renal insufficiency: Patient's creatinine has slightly improved this week to 1.51. 10.  Leukopenia: Mild, monitor.  Patient expressed understanding and was in agreement with this plan. He also understands that He can call clinic at any time with any questions, concerns, or complaints.   Cancer Staging Malignant melanoma [facility]) Staging form: Melanoma of the Skin, AJCC 7th Edition - Clinical stage from 09/21/2015: Stage IV ([place], [place], [place]) - Signed by [name], MD on 10/09/2015   [name], MD   04/21/2019 10:51 AM

## 2019-04-21 ENCOUNTER — Inpatient Hospital Stay: Payer: Medicare Other | Admitting: *Deleted

## 2019-04-21 ENCOUNTER — Inpatient Hospital Stay: Payer: Medicare Other

## 2019-04-21 ENCOUNTER — Inpatient Hospital Stay (HOSPITAL_BASED_OUTPATIENT_CLINIC_OR_DEPARTMENT_OTHER): Payer: Medicare Other | Admitting: Oncology

## 2019-04-21 ENCOUNTER — Other Ambulatory Visit: Payer: Self-pay

## 2019-04-21 ENCOUNTER — Other Ambulatory Visit: Payer: Self-pay | Admitting: Oncology

## 2019-04-21 VITALS — BP 135/92 | HR 98 | Temp 98.0°F | Resp 20 | Wt 207.6 lb

## 2019-04-21 DIAGNOSIS — C439 Malignant melanoma of skin, unspecified: Secondary | ICD-10-CM

## 2019-04-21 DIAGNOSIS — C9241 Acute promyelocytic leukemia, in remission: Secondary | ICD-10-CM

## 2019-04-21 DIAGNOSIS — Z95828 Presence of other vascular implants and grafts: Secondary | ICD-10-CM

## 2019-04-21 DIAGNOSIS — Z5111 Encounter for antineoplastic chemotherapy: Secondary | ICD-10-CM | POA: Diagnosis not present

## 2019-04-21 LAB — COMPREHENSIVE METABOLIC PANEL
ALT: 27 U/L (ref 0–44)
AST: 32 U/L (ref 15–41)
Albumin: 3.3 g/dL — ABNORMAL LOW (ref 3.5–5.0)
Alkaline Phosphatase: 51 U/L (ref 38–126)
Anion gap: 8 (ref 5–15)
BUN: 42 mg/dL — ABNORMAL HIGH (ref 8–23)
CO2: 22 mmol/L (ref 22–32)
Calcium: 8.7 mg/dL — ABNORMAL LOW (ref 8.9–10.3)
Chloride: 107 mmol/L (ref 98–111)
Creatinine, Ser: 1.51 mg/dL — ABNORMAL HIGH (ref 0.61–1.24)
GFR calc Af Amer: 50 mL/min — ABNORMAL LOW (ref >60)
GFR calc non Af Amer: 43 mL/min — ABNORMAL LOW (ref >60)
Glucose, Bld: 109 mg/dL — ABNORMAL HIGH (ref 70–99)
Potassium: 4.7 mmol/L (ref 3.5–5.1)
Sodium: 137 mmol/L (ref 135–145)
Total Bilirubin: 0.5 mg/dL (ref 0.3–1.2)
Total Protein: 5.6 g/dL — ABNORMAL LOW (ref 6.5–8.1)

## 2019-04-21 LAB — CBC WITH DIFFERENTIAL/PLATELET
Abs Immature Granulocytes: 0.04 10*3/uL (ref 0.00–0.07)
Basophils Absolute: 0 10*3/uL (ref 0.0–0.1)
Basophils Relative: 0 %
Eosinophils Absolute: 0.1 10*3/uL (ref 0.0–0.5)
Eosinophils Relative: 2 %
HCT: 35.8 % — ABNORMAL LOW (ref 39.0–52.0)
Hemoglobin: 11.4 g/dL — ABNORMAL LOW (ref 13.0–17.0)
Immature Granulocytes: 1 %
Lymphocytes Relative: 6 %
Lymphs Abs: 0.2 10*3/uL — ABNORMAL LOW (ref 0.7–4.0)
MCH: 33.1 pg (ref 26.0–34.0)
MCHC: 31.8 g/dL (ref 30.0–36.0)
MCV: 104.1 fL — ABNORMAL HIGH (ref 80.0–100.0)
Monocytes Absolute: 0.2 10*3/uL (ref 0.1–1.0)
Monocytes Relative: 5 %
Neutro Abs: 3.3 10*3/uL (ref 1.7–7.7)
Neutrophils Relative %: 86 %
Platelets: 197 10*3/uL (ref 150–400)
RBC: 3.44 MIL/uL — ABNORMAL LOW (ref 4.22–5.81)
RDW: 17.5 % — ABNORMAL HIGH (ref 11.5–15.5)
WBC: 3.9 10*3/uL — ABNORMAL LOW (ref 4.0–10.5)
nRBC: 3.3 % — ABNORMAL HIGH (ref 0.0–0.2)

## 2019-04-21 MED ORDER — HEPARIN SOD (PORK) LOCK FLUSH 100 UNIT/ML IV SOLN
500.0000 [IU] | Freq: Once | INTRAVENOUS | Status: AC | PRN
  Administered 2019-04-21: 14:00:00 500 [IU]
  Filled 2019-04-21: qty 5

## 2019-04-21 MED ORDER — SODIUM CHLORIDE 0.9 % IV SOLN
Freq: Once | INTRAVENOUS | Status: AC
  Filled 2019-04-21: qty 250

## 2019-04-21 MED ORDER — ONDANSETRON 4 MG PO TBDP: 4.0000 mg | ORAL_TABLET | Freq: Three times a day (TID) | ORAL | 2 refills | Status: DC | PRN

## 2019-04-21 MED ORDER — SODIUM CHLORIDE 0.9 % IV SOLN
0.1500 mg/kg/d | Freq: Every day | INTRAVENOUS | Status: DC
  Administered 2019-04-21: 12:00:00 13.3 mg via INTRAVENOUS
  Filled 2019-04-21: qty 13.3

## 2019-04-21 MED ORDER — PALONOSETRON HCL INJECTION 0.25 MG/5ML
0.2500 mg | Freq: Once | INTRAVENOUS | Status: AC
  Administered 2019-04-21: 11:00:00 0.25 mg via INTRAVENOUS
  Filled 2019-04-21: qty 5

## 2019-04-21 MED ORDER — SODIUM CHLORIDE 0.9% FLUSH
10.0000 mL | Freq: Once | INTRAVENOUS | Status: AC
  Administered 2019-04-21: 10 mL via INTRAVENOUS
  Filled 2019-04-21: qty 10

## 2019-04-21 MED ORDER — HEPARIN SOD (PORK) LOCK FLUSH 100 UNIT/ML IV SOLN
INTRAVENOUS | Status: AC
  Filled 2019-04-21: qty 5

## 2019-04-21 MED ORDER — SODIUM CHLORIDE 0.9 % IV SOLN
10.0000 mg | Freq: Once | INTRAVENOUS | Status: AC
  Administered 2019-04-21: 12:00:00 10 mg via INTRAVENOUS
  Filled 2019-04-21: qty 10

## 2019-04-22 ENCOUNTER — Inpatient Hospital Stay: Payer: Medicare Other

## 2019-04-22 ENCOUNTER — Other Ambulatory Visit: Payer: Self-pay

## 2019-04-22 VITALS — BP 138/74 | HR 76 | Temp 96.9°F | Resp 18

## 2019-04-22 DIAGNOSIS — C9241 Acute promyelocytic leukemia, in remission: Secondary | ICD-10-CM

## 2019-04-22 DIAGNOSIS — Z5111 Encounter for antineoplastic chemotherapy: Secondary | ICD-10-CM | POA: Diagnosis not present

## 2019-04-22 MED ORDER — SODIUM CHLORIDE 0.9 % IV SOLN
0.1500 mg/kg/d | Freq: Every day | INTRAVENOUS | Status: DC
  Administered 2019-04-22: 14:00:00 13.3 mg via INTRAVENOUS
  Filled 2019-04-22: qty 13.3

## 2019-04-22 MED ORDER — SODIUM CHLORIDE 0.9 % IV SOLN
Freq: Once | INTRAVENOUS | Status: AC
  Filled 2019-04-22: qty 250

## 2019-04-22 MED ORDER — HEPARIN SOD (PORK) LOCK FLUSH 100 UNIT/ML IV SOLN
INTRAVENOUS | Status: AC
  Filled 2019-04-22: qty 5

## 2019-04-22 MED ORDER — SODIUM CHLORIDE 0.9% FLUSH
10.0000 mL | INTRAVENOUS | Status: DC | PRN
  Administered 2019-04-22: 14:00:00 10 mL
  Filled 2019-04-22: qty 10

## 2019-04-22 MED ORDER — HEPARIN SOD (PORK) LOCK FLUSH 100 UNIT/ML IV SOLN
500.0000 [IU] | Freq: Once | INTRAVENOUS | Status: AC | PRN
  Administered 2019-04-22: 17:00:00 500 [IU]
  Filled 2019-04-22: qty 5

## 2019-04-22 MED ORDER — SODIUM CHLORIDE 0.9 % IV SOLN
10.0000 mg | Freq: Once | INTRAVENOUS | Status: AC
  Administered 2019-04-22: 14:00:00 10 mg via INTRAVENOUS
  Filled 2019-04-22: qty 10

## 2019-04-23 ENCOUNTER — Inpatient Hospital Stay: Payer: Medicare Other

## 2019-04-23 ENCOUNTER — Other Ambulatory Visit: Payer: Self-pay

## 2019-04-23 DIAGNOSIS — C9241 Acute promyelocytic leukemia, in remission: Secondary | ICD-10-CM

## 2019-04-23 DIAGNOSIS — Z5111 Encounter for antineoplastic chemotherapy: Secondary | ICD-10-CM | POA: Diagnosis not present

## 2019-04-23 MED ORDER — SODIUM CHLORIDE 0.9 % IV SOLN
10.0000 mg | Freq: Once | INTRAVENOUS | Status: AC
  Administered 2019-04-23: 13:00:00 10 mg via INTRAVENOUS
  Filled 2019-04-23: qty 10

## 2019-04-23 MED ORDER — SODIUM CHLORIDE 0.9 % IV SOLN
0.1500 mg/kg/d | Freq: Every day | INTRAVENOUS | Status: DC
  Administered 2019-04-23: 13.3 mg via INTRAVENOUS
  Filled 2019-04-23: qty 13.3

## 2019-04-23 MED ORDER — SODIUM CHLORIDE 0.9 % IV SOLN
Freq: Once | INTRAVENOUS | Status: AC
  Filled 2019-04-23: qty 250

## 2019-04-23 MED ORDER — HEPARIN SOD (PORK) LOCK FLUSH 100 UNIT/ML IV SOLN
INTRAVENOUS | Status: AC
  Filled 2019-04-23: qty 5

## 2019-04-23 MED ORDER — PALONOSETRON HCL INJECTION 0.25 MG/5ML
0.2500 mg | Freq: Once | INTRAVENOUS | Status: AC
  Administered 2019-04-23: 13:00:00 0.25 mg via INTRAVENOUS
  Filled 2019-04-23: qty 5

## 2019-04-23 MED ORDER — HEPARIN SOD (PORK) LOCK FLUSH 100 UNIT/ML IV SOLN
500.0000 [IU] | Freq: Once | INTRAVENOUS | Status: AC | PRN
  Administered 2019-04-23: 16:00:00 500 [IU]
  Filled 2019-04-23: qty 5

## 2019-04-24 ENCOUNTER — Inpatient Hospital Stay (HOSPITAL_BASED_OUTPATIENT_CLINIC_OR_DEPARTMENT_OTHER): Payer: Medicare Other | Admitting: Hospice and Palliative Medicine

## 2019-04-24 ENCOUNTER — Other Ambulatory Visit: Payer: Self-pay | Admitting: Emergency Medicine

## 2019-04-24 ENCOUNTER — Other Ambulatory Visit: Payer: Self-pay

## 2019-04-24 ENCOUNTER — Inpatient Hospital Stay: Payer: Medicare Other

## 2019-04-24 VITALS — BP 120/71 | HR 74 | Temp 96.6°F | Resp 18

## 2019-04-24 DIAGNOSIS — C9241 Acute promyelocytic leukemia, in remission: Secondary | ICD-10-CM

## 2019-04-24 DIAGNOSIS — Z23 Encounter for immunization: Secondary | ICD-10-CM | POA: Diagnosis not present

## 2019-04-24 DIAGNOSIS — Z515 Encounter for palliative care: Secondary | ICD-10-CM

## 2019-04-24 DIAGNOSIS — C439 Malignant melanoma of skin, unspecified: Secondary | ICD-10-CM

## 2019-04-24 DIAGNOSIS — Z5111 Encounter for antineoplastic chemotherapy: Secondary | ICD-10-CM | POA: Diagnosis not present

## 2019-04-24 MED ORDER — SODIUM CHLORIDE 0.9 % IV SOLN
Freq: Once | INTRAVENOUS | Status: AC
  Filled 2019-04-24: qty 250

## 2019-04-24 MED ORDER — SODIUM CHLORIDE 0.9 % IV SOLN
0.1500 mg/kg/d | Freq: Every day | INTRAVENOUS | Status: DC
  Administered 2019-04-24: 13.3 mg via INTRAVENOUS
  Filled 2019-04-24: qty 13.3

## 2019-04-24 MED ORDER — HEPARIN SOD (PORK) LOCK FLUSH 100 UNIT/ML IV SOLN
500.0000 [IU] | Freq: Once | INTRAVENOUS | Status: AC | PRN
  Administered 2019-04-24: 500 [IU]
  Filled 2019-04-24: qty 5

## 2019-04-24 MED ORDER — SODIUM CHLORIDE 0.9 % IV SOLN
10.0000 mg | Freq: Once | INTRAVENOUS | Status: AC
  Administered 2019-04-24: 14:00:00 10 mg via INTRAVENOUS
  Filled 2019-04-24: qty 10

## 2019-04-24 NOTE — Progress Notes (Signed)
[place]  [phone] [phone]   Name: David [name]. Date: 04/24/2019 MRN: [id number]  DOB: 04-19-1940  Patient Care Team: [name], MD as PCP - General (Family Medicine) Cira Rue, RN Nurse Navigator as Registered Nurse (Medical Oncology) Acquanetta Chain, DO as Consulting Physician [facility] and Palliative Medicine) [name], MD as Consulting Physician (Hematology and Oncology)    REASON FOR CONSULTATION: David [name]. is a 79 y.o. male with multiple medical problems including acute promyelocytic leukemia in remission on current treatment with arsenic and ATRA.  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  Patient follows at [facility] for his APML but receives treatment locally.  He has had fatigue.  Patient was referred to palliative care to help address goals and manage ongoing symptoms.   SOCIAL HISTORY:     reports that he quit smoking about 62 years ago. He has a 3.00 pack-year smoking history. He has never used smokeless tobacco. He reports current alcohol use of about 14.0 standard drinks of alcohol per week. He reports that he does not use drugs.   Patient is married and lives at home with his wife.  They live at [facility] in an apartment.  He has a son in [place] and a daughter in [place].  Patient is a retired Pharmacist, community.  ADVANCE DIRECTIVES:  On file  CODE STATUS: DNR/DNI (MOST form on file)  PAST MEDICAL HISTORY: Past Medical History:  Diagnosis Date  . Anginal pain ([place])   . Arthritis    mostly hands  . Benign familial hematuria   . BPH (benign prostatic hypertrophy)   . Brain cancer ([place])    melanoma with brain met  . Coronary artery disease   . GERD (gastroesophageal reflux disease)   . High cholesterol   . Hypertension   .  Hypothyroidism   . Intracerebral hemorrhage ([place])   . Melanoma ([place])   . Metastatic melanoma to lung ([place])   . Mood disorder ([place])   . Myocardial infarction ([place])   . Pacemaker    due to syncope, 3rd degree HB (upgrade to [facility]. Jude CRT-P 02/25/13 (Dr. Uvaldo Rising)  . Rectal bleed    due to NSAIDS  . Renal disorder   . Sepsis ([place]) 12/09/2018  . Skin cancer    melanoma  . Sleep apnea    uses CPAP  . Stroke [facility])     PAST SURGICAL HISTORY:  Past Surgical History:  Procedure Laterality Date  . APPLICATION OF CRANIAL NAVIGATION N/A 10/15/2015   Procedure: APPLICATION OF CRANIAL NAVIGATION;  Surgeon: Erline Levine, MD;  Location: [place] NEURO ORS;  Service: Neurosurgery;  Laterality: N/A;  . BIV PACEMAKER GENERATOR CHANGEOUT N/A 04/02/2019   Procedure: BIV PACEMAKER GENERATOR CHANGEOUT;  Surgeon: Evans Lance, MD;  Location: [place] CV LAB;  Service: Cardiovascular;  Laterality: N/A;  . CARDIAC CATHETERIZATION  2013  . CARDIAC SURGERY     bypass X 2  . COLONOSCOPY WITH PROPOFOL N/A 05/08/2016   diverticulosis, int hem, no f/u needed [name])  . CORONARY ARTERY BYPASS GRAFT  2013   LIMA-LAD, SVG-PDA 03/27/11 (Dr. Francee Gentile)  . CRANIOTOMY N/A 10/15/2015   Procedure: LEFT FRONTAL CRANIOTOMY TUMOR EXCISION with Curve;  Surgeon: Erline Levine, MD;  Location: [place] NEURO ORS;  Service: Neurosurgery;  Laterality: N/A;  CRANIOTOMY TUMOR  EXCISION  . EYE SURGERY    . FOOT SURGERY  08/2010  . JOINT REPLACEMENT Left    partial knee  . KNEE ARTHROSCOPY  12/2008  . LYMPH NODE BIOPSY    . PACEMAKER INSERTION    . TONSILLECTOMY      HEMATOLOGY/ONCOLOGY HISTORY:  Oncology History Overview Note  Metastatic melanoma ([place])   Staging form: Melanoma of the Skin, AJCC 7th Edition     Clinical stage from 09/21/2015: Stage IV [place], [place], [place]) - Signed by [name], MD on 10/09/2015     Malignant melanoma ([place])  09/16/2015 Imaging   PET scan showed a hypermetabolic 9.6QI lingular nodule, and a 52m right upper lobe lung  nodule, left subclavicular nodes, and bone metastasis in the proximal right humerus and right femur.    09/21/2015 Initial Diagnosis   Metastatic melanoma ([place]   09/21/2015 Initial Biopsy   Left subclavicular lymph node biopsy showed prostatic of melanoma   09/21/2015 Miscellaneous   BRAF V600K mutation (+)    10/04/2015 Imaging   CT head with without contrast showed a 2.0 x 1.6 x 1.7 cm superficial left frontal hyperdense lesion, with postcontrast enhancement, lying within the previous hemorrhagic area, concerning for solitary melanoma metastasis   10/06/2015 - 09/06/2016 Chemotherapy   Nivolumab 2429mevery 2 weeks, held due to his dexamethasone for radionecrosis    10/13/2015 - 10/13/2015 Radiation Therapy   10/13/2015 Preop SRS treatment: 14 Gy  to the left frontal lesion in 1 fraction   10/15/2015 Surgery   left front craniotomy and tumor excision    10/15/2015 Pathology Results   left frontal brain mass excision showed metastatic melanoma, 1.5X1.5X0.6cm   01/13/2016 Imaging   CT head without and with contrast showed ill-defined enhancement and dural thickening of the left frontal resection cavity may represent a post treatment changes or residual neoplasm. A new 6 mm hypodensity in the right cerebellar hemisphere with uncertainty enhancement may represent a metastasis or small brain parenchymal hemorrhage.    01/27/2016 - 01/27/2016 Radiation Therapy   01/27/16 SRS Treatment:  20 Gy in 1 fraction to a 6 mm right cerebellar brain met    02/2016 - 05/24/2018 Chemotherapy   Monthly Xgeva starting 02/2016, changed to every 3 months on 01/30/2018. Stopped on 05/24/2018   03/29/2016 PET scan   PET 03/29/2016 IMPRESSION: 1. Overall there has been an interval response to therapy. The index lesions sided on previous exam it are less hypermetabolic when compared with the previous exam. 2. There is an intramuscular lesion within the right masseter which has increased FDG uptake compared with  previous exam. 3. No new sites of disease identified. 4. Aortic atherosclerosis and infrarenal abdominal aortic aneurysm. Recommend followup by ultrasound in 3 years. This recommendation follows ACR consensus guidelines: White Paper of the ACR Incidental Findings Committee II on Vascular Findings. J Joellyn Ruedadiol 2013; 10:789-794   04/20/2016 Imaging   CT Head w wo contrast 1. Mild patchy enhancement at the left anterior frontal lobe treatment site (series 11, image 39) without mass effect and stable surrounding white matter hypodensity without strong evidence of acute vasogenic edema. Recommend continued CT surveillance. 2. No new metastatic disease or new intracranial abnormality identified. 3. Probable benign 14 mm left parietal bone lucent lesion, unchanged since May 2017.   05/06/2016 - 05/08/2016 Hospital Admission   Pt was admitted for rectal bleeding for one day. Breathing spontaneously resolved, hemoglobin dropped from 15 to 10.5, did not require blood transfusion. Colonoscopy showed no  active bleeding, except diverticulosis and hemorrhoid. He was discharged to home.   05/08/2016 Procedure   Colonoscopy  - Stool in the ascending colon. Fluid aspiration performed. - Diverticulosis in the entire examined colon. - Non-bleeding internal hemorrhoids. - The examination was otherwise normal on direct and retroflexion views.   05/29/2016 Imaging   PET Image Restaging IMPRESSION: 1. Stable exam with no evidence progression. 2. Photopenia in the LEFT frontal lobe at prior surgical site. 3. Stable mildly hypermetabolic RIGHT paratracheal node. This may represent a thyroid nodule. 4. Stable size of minimally metabolic lingular pulmonary nodule. 5. Stable skeletal lesion of the RIGHT iliac crest 6. No cutaneous metabolic lesion.   07/20/2016 Imaging   CT Head W WO Contrast IMPRESSION: Pronounced change in the left frontal region. Much more regional vasogenic edema. Relatively  stable appearance of the abnormal enhancement extending from the lateral margin of the frontal horn of left lateral ventricle to the frontal brain surface. New region of abnormal enhancement surrounding the frontal horn of the left lateral ventricle measuring approximately 3.2 cm. The differential diagnosis is recurrent tumor versus radiation necrosis. The patient is reportedly not an MRI candidate. This area is large enough that CT perfusion could possibly make a reasonable differentiation.   09/02/2016 Imaging   CT Head 09/02/16 IMPRESSION: Somewhat mixed pattern of slight posterior progression of pleomorphic enhancement surrounding the LEFT lateral ventricle although no significant mass effect, and reduced vasogenic edema compared with the scan in April.   09/27/2016 - 11/02/2016 Chemotherapy   Start dabrafenib and trametinib 09/27/16, stopped due to severe fatigue     10/05/2016 Imaging   CT Head w & w/o contrast  IMPRESSION: 1. Decreased size of the enhancing area within the left frontal lobe treatment site. Surrounding edema has also decreased. The findings support continued evolution of radiation necrosis. 2. No new enhancing lesions.   10/18/2016 PET scan   IMPRESSION: 1. New focal hypermetabolism in the proximal sigmoid colon with associated focal colonic wall thickening and mild pericolonic fat stranding at the site of a proximal sigmoid diverticulum, favored to represent mild acute sigmoid diverticulitis. Metastatic disease is considered less likely. Clinical correlation is necessary. Consider appropriate antibiotic therapy, as clinically warranted. Attention to this region recommended on future PET-CT follow-up. 2. Otherwise complete metabolic response with no evidence of hypermetabolic metastatic disease. Lingular pulmonary nodule is slightly decreased in size and demonstrates no significant metabolism. 3. No residual hypermetabolism in the right thyroid lobe  nodule, which is stable in size. 4. Aortic Atherosclerosis (ICD10-I70.0). Stable 3.1 cm infrarenal abdominal aortic aneurysm. Aortic aneurysm NOS (ICD10-I71.9). Recommend followup by ultrasound in 3 years. This recommendation follows ACR consensus guidelines: White Paper of the ACR Incidental Findings Committee II on Vascular Findings. Joellyn Rued Radiol 2013; 40:981-191.   11/01/2016 - 11/04/2016 Hospital Admission   Patient admitted to the hospital due to hypercalcemia where he received IV fluids and pamidronate   12/13/2016 PET scan   PET 12/13/16 IMPRESSION: 1. No findings to suggest metastatic disease on today's examination. 2. Previously noted hypermetabolism in the sigmoid colon has resolved, presumably indicative of a focus of diverticulitis on the prior study. Today's study does demonstrate relatively diffuse hypermetabolism throughout the cecum, ascending colon and proximal transverse colon where there is some mild mural thickening, favored to reflect mild chronic inflammation related to underlying diverticular disease. No definite inflammatory changes on the CT portion of the examination to strongly suggest an active acute diverticulitis at this time. No discrete lesion to suggest  neoplasm. 3. Previously noted 8 mm lingular nodule is stable in size and known remains non hypermetabolic. This is nonspecific but reassuring. Continued attention on future followup studies is recommended. 4. Aortic atherosclerosis, in addition to left main and 3 vessel coronary artery disease. Status post median sternotomy for CABG including LIMA to the LAD. In addition, there is mild fusiform aneurysmal dilatation of the infrarenal abdominal aorta (3.1 cm in diameter) as well as aneurysmal dilatation of the left common iliac   12/18/2016 - 12/31/2017 Chemotherapy   Restarted nivolamab 251m every 2 weeks, with tapering dose dexamethasone     04/18/2017 Imaging   PET Scan IMPRESSION: 1. No  hypermetabolic lesion to suggest active malignancy. 2. Stable 8 mm lingular nodule is not currently hypermetabolic but merit surveillance. Infrarenal abdominal aortic aneurysm 3.1 cm in diameter. Recommend followup by [facility] 3 years. 3. Other imaging findings of potential clinical significance: Postoperative findings in the left frontal lobe. Right renal cyst. Aortic Atherosclerosis (ICD10-I70.0). Osteoarthritis. Lumbar scoliosis. Left knee effusion. Colonic diverticulosis.    10/10/2017 PET scan   IMPRESSION: 1. Small focus of FDG accumulation in the subcutaneous fat of the right gluteal region, nonspecific. Attention on follow-up suggested. 2. Otherwise stable exam without new or progressive hypermetabolic disease in the interval since prior PET-CT. 3. Stable 8 mm nodule in the lingula. No evidence for hypermetabolism on PET imaging. 4.  Aortic Atherosclerois (ICD10-170.0)    01/29/2018 PET scan   IMPRESSION: 1. Small focus of hypermetabolism in the lower left paratracheal neck, which appears to localize to the posterior left thyroid lobe, without discrete thyroid nodule on the CT images, decreased in metabolism since 10/10/2017 PET-CT. This finding is nonspecific and may represent a hypermetabolic thyroid nodule. Consider thyroid ultrasound correlation. 2. No new hypermetabolic findings suspicious for metastatic disease. Previously described low level metabolism in the subcutaneous right gluteal region has resolved, compatible with resolved inflammatory focus. No evidence of recurrent hypermetabolism within the stable lingular pulmonary nodule. No evidence of recurrent osseous hypermetabolism. 3. Stable infrarenal 3.4 cm Abdominal Aortic Aneurysm (ICD10-I71.9). Recommend follow-up aortic ultrasound in 3 years. This recommendation follows ACR consensus guidelines: White Paper of the ACR Incidental Findings Committee II on Vascular Findings. J Am Coll Radiol 2013;; 88:325-498 4.   Aortic Atherosclerosis (ICD10-I70.0).   09/18/2018 PET scan   PET 09/18/18  IMPRESSION: 1. No findings of active malignancy. The known small right cerebellar mass is not readily apparent on PET-CT but was shown on today's CT head. 2. 3.2 cm infrarenal abdominal aortic aneurysm. Recommend followup by [facility] 3 years. This recommendation follows ACR consensus guidelines: White Paper of the ACR Incidental Findings Committee II on Vascular Findings. J Am Coll Radiol 2013; 10:789-794. Aortic aneurysm NOS (ICD10-I71.9). 3. Other imaging findings of potential clinical significance: Left frontal lobe encephalomalacia underlying the craniotomy site. Chronic paranasal sinusitis. Aortic Atherosclerosis (ICD10-I70.0). Coronary atherosclerosis. Mild cardiomegaly. Colonic diverticulosis. Dextroconvex lumbar scoliosis. Small left knee effusion with mild synovitis. Postoperative findings in the right foot.   09/18/2018 Imaging   CT head  IMPRESSION: 1. No evidence of new intracranial metastases or acute abnormality. 2. Unchanged 4 mm right cerebellar metastasis. 3. Stable post treatment changes in the left frontal lobe.   APML (acute promyelocytic leukemia) in remission ([place]  01/08/2019 Initial Diagnosis   APML (acute promyelocytic leukemia) in remission ([place]   04/14/2019 -  Chemotherapy   The patient had palonosetron (ALOXI) injection 0.25 mg, 0.25 mg, Intravenous,  Once, 1 of 4 cycles Administration: 0.25 mg (  04/15/2019), 0.25 mg (04/17/2019), 0.25 mg (04/21/2019), 0.25 mg (04/23/2019), 0.25 mg (04/18/2019) arsenic trioxide (TRISENOX) 13.3 mg in sodium chloride 0.9 % 250 mL chemo Infusion, 0.15 mg/kg = 13.3 mg (100 % of original dose 0.15 mg/kg), Intravenous,  Once, 1 of 4 cycles Dose modification: 0.15 mg/kg/day (original dose 0.15 mg/kg, Cycle 1, Reason: Other (see comments)), 0.15 mg/kg (original dose 0.15 mg/kg, Cycle 1, Reason: Other (see comments), Comment: change to mg/kg) Administration: 13.3 mg  (04/21/2019), 13.3 mg (04/22/2019), 13.3 mg (04/23/2019)  for chemotherapy treatment.      ALLERGIES:  is allergic to ciprofloxacin; ezetimibe; and simvastatin.  MEDICATIONS:  Current Outpatient Medications  Medication Sig Dispense Refill  . acetaminophen (TYLENOL) 325 MG tablet Take 2 tablets (650 mg total) by mouth every 6 (six) hours as needed for mild pain (or Fever >/= 101).    Marland Kitchen acyclovir (ZOVIRAX) 400 MG tablet Take 400 mg by mouth 2 (two) times daily.    Marland Kitchen allopurinol (ZYLOPRIM) 100 MG tablet Take 2 tablets (200 mg total) by mouth daily. 180 tablet 1  . aspirin EC 81 MG tablet Take 81 mg by mouth every evening.     . Blood Glucose Monitoring Suppl (ONETOUCH VERIO) w/Device KIT 1 kit by Does not apply route as directed. E11.65 1 kit 0  . carboxymethylcellulose (REFRESH TEARS) 0.5 % SOLN Apply to eye.    . Cholecalciferol (VITAMIN D3) 25 MCG (1000 UT) CAPS Take 1 capsule (1,000 Units total) by mouth daily. 30 capsule 0  . citalopram (CELEXA) 10 MG tablet Take 1 tablet (10 mg total) by mouth daily. 90 tablet 1  . colchicine 0.6 MG tablet Take 1 tablet (0.6 mg total) by mouth daily as needed (gout flare). Take 2 tablets on the first day of flare 30 tablet 0  . glucose blood test strip E11.65 Use as instructed to check sugars three time daily and as needed 100 each 12  . HUMALOG KWIKPEN 100 UNIT/ML KwikPen     . hydrocortisone cream 1 % Apply topically.    . insulin aspart (NOVOLOG FLEXPEN) 100 UNIT/ML FlexPen Use three times daily with meals as needed per sliding scale: 2 units for every 50 units over 150 (2u for 150-199, 4u for 200-249, 6u for 250-299, etc) 3 mL 1  . ketoconazole (NIZORAL) 2 % shampoo Apply topically.    . Lancets (ONETOUCH ULTRASOFT) lancets E11.65 Use as instructed to check sugars three time daily and as needed 100 each 12  . levothyroxine (SYNTHROID) 137 MCG tablet TAKE 1 TABLET BY MOUTH ONCE DAILY BEFORE BREAKFAST (Patient taking differently: Take 137 mcg by mouth  daily before breakfast. ) 30 tablet 11  . methylphenidate (RITALIN) 5 MG tablet Take 1 tablet (5 mg total) by mouth 2 (two) times daily with breakfast and lunch. 60 tablet 0  . metoprolol succinate (TOPROL-XL) 50 MG 24 hr tablet TAKE 1 AND 1/2 TABLET (75 MG) BY MOUTH EVERY DAY WITH OR IMMEDIATELY FOLLOWING A MEAL *DO NOT CRUSH* (Patient taking differently: Take 50 mg by mouth daily. ) 50 tablet 11  . mineral oil-hydrophilic petrolatum (AQUAPHOR) ointment Apply topically.    . ondansetron (ZOFRAN-ODT) 4 MG disintegrating tablet Take 1 tablet (4 mg total) by mouth every 8 (eight) hours as needed for nausea or vomiting. 60 tablet 2  . polyethylene glycol (MIRALAX / GLYCOLAX) 17 g packet Take 17 g by mouth 2 (two) times daily. (Patient taking differently: Take 17 g by mouth daily. ) 14 each 0  . predniSONE (  DELTASONE) 5 MG tablet Take 10 mg by mouth daily.     Marland Kitchen senna (SENOKOT) 8.6 MG TABS tablet Take 1 tablet (8.6 mg total) by mouth at bedtime. 120 tablet 0  . senna-docusate (SENOKOT-S) 8.6-50 MG tablet Take by mouth.    . sodium chloride (ALTAMIST SPRAY) 0.65 % nasal spray Place into the nose.    . testosterone cypionate (DEPOTESTOSTERONE CYPIONATE) 200 MG/ML injection Inject 100 mg into the muscle every 14 (fourteen) days.    . traZODone (DESYREL) 50 MG tablet Take 0.5-1 tablets (25-50 mg total) by mouth at bedtime. (Patient taking differently: Take 25 mg by mouth at bedtime as needed for sleep. ) 30 tablet 1  . tretinoin (VESANOID) 10 MG capsule     . Turmeric Curcumin 500 MG CAPS Take by mouth.     No current facility-administered medications for this visit.   Facility-Administered Medications Ordered in Other Visits  Medication Dose Route Frequency Provider Last Rate Last Admin  . 0.9 %  sodium chloride infusion  1,000 mL Intravenous Once [name], MD      . arsenic trioxide (TRISENOX) 13.3 mg in sodium chloride 0.9 % 250 mL chemo Infusion  0.15 mg/kg/day (Treatment Plan Recorded) Intravenous  Daily [name], MD   Stopped at 04/21/19 1404  . arsenic trioxide (TRISENOX) 13.3 mg in sodium chloride 0.9 % 250 mL chemo Infusion  0.15 mg/kg/day (Treatment Plan Recorded) Intravenous Daily [name], MD      . heparin lock flush 100 unit/mL  500 Units Intracatheter Once PRN [name], MD        VITAL SIGNS: There were no vitals taken for this visit. There were no vitals filed for this visit.  Estimated body mass index is 30.22 kg/m as calculated from the following:   Height as of 04/02/19: 5' 9.5" (1.765 m).   Weight as of 04/21/19: 207 lb 9.6 oz (94.2 kg).  LABS: CBC:    Component Value Date/Time   WBC 3.9 (L) 04/21/2019 0946   HGB 11.4 (L) 04/21/2019 0946   HGB 14.0 05/24/2018 1445   HGB 15.8 03/26/2017 0928   HCT 35.8 (L) 04/21/2019 0946   HCT 46.9 03/26/2017 0928   PLT 197 04/21/2019 0946   PLT 153 05/24/2018 1445   PLT 158 03/26/2017 0928   MCV 104.1 (H) 04/21/2019 0946   MCV 92.6 03/26/2017 0928   NEUTROABS 3.3 04/21/2019 0946   NEUTROABS 3.4 03/26/2017 0928   LYMPHSABS 0.2 (L) 04/21/2019 0946   LYMPHSABS 0.6 (L) 03/26/2017 0928   MONOABS 0.2 04/21/2019 0946   MONOABS 0.6 03/26/2017 0928   EOSABS 0.1 04/21/2019 0946   EOSABS 0.3 03/26/2017 0928   BASOSABS 0.0 04/21/2019 0946   BASOSABS 0.0 03/26/2017 0928   Comprehensive Metabolic Panel:    Component Value Date/Time   NA 137 04/21/2019 0946   NA 140 03/26/2017 0928   K 4.7 04/21/2019 0946   K 4.7 03/26/2017 0928   CL 107 04/21/2019 0946   CO2 22 04/21/2019 0946   CO2 21 (L) 03/26/2017 0928   BUN 42 (H) 04/21/2019 0946   BUN 19.3 03/26/2017 0928   CREATININE 1.51 (H) 04/21/2019 0946   CREATININE 1.77 (H) 05/24/2018 1445   CREATININE 1.2 03/26/2017 0928   GLUCOSE 109 (H) 04/21/2019 0946   GLUCOSE 95 03/26/2017 0928   CALCIUM 8.7 (L) 04/21/2019 0946   CALCIUM 8.7 03/26/2017 0928   AST 32 04/21/2019 0946   AST 18 05/24/2018 1445   AST  22 03/26/2017 0928   ALT 27  04/21/2019 0946   ALT 13 05/24/2018 1445   ALT 15 03/26/2017 0928   ALKPHOS 51 04/21/2019 0946   ALKPHOS 58 03/26/2017 0928   BILITOT 0.5 04/21/2019 0946   BILITOT 0.6 05/24/2018 1445   BILITOT 0.58 03/26/2017 0928   PROT 5.6 (L) 04/21/2019 0946   PROT 6.8 03/26/2017 0928   ALBUMIN 3.3 (L) 04/21/2019 0946   ALBUMIN 3.7 03/26/2017 0928    RADIOGRAPHIC STUDIES: EP PPM/ICD IMPLANT  Result Date: 04/02/2019 Conclusion: successful Biv PPM gen change out in a patient with CHB and chronic systolic heart failure, s/p PPM insertion with the old device at [facility]. Gregg Taylor,M.D.  CUP PACEART INCLINIC DEVICE CHECK  Result Date: 04/17/2019 Pacemaker check in clinic. Normal device function. Thresholds, sensing, impedances consistent with previous measurements. Device programmed to maximize longevity. No mode switch or high ventricular rates noted. Device programmed at appropriate safety margins. Histogram distribution appropriate for patient activity level. Device programmed to optimize intrinsic conduction. Estimated longevity 5 yrs 10 months. Patient enrolled in remote follow-up 07/01/19. Follow up with [name] 07/08/19 in [place]. Patient education completed. Pacemaker check in clinic. Normal device function. Thresholds, sensing, impedances consistent with previous measurements. Device programmed to maximize longevity. No mode switch or high ventricular rates noted. Device programmed at appropriate safety margins. Histogram distribution appropriate for patient activity level. Device programmed to optimize intrinsic conduction. Estimated longevity 5 yrs 10 months. Patient enrolled in remote follow-up 07/01/19. Follow up with [name] 07/08/19 in [place]. Patient education completed.   PERFORMANCE STATUS (ECOG) : 2 - Symptomatic, <50% confined to bed  Review of Systems Unless otherwise noted, a complete review of systems is negative.  Physical Exam General: NAD, frail appearing, thin Pulmonary:  Unlabored Extremities: no edema, no joint deformities Skin: no rashes Neurological: Weakness but otherwise nonfocal  IMPRESSION: I met with patient in the infusion area.  Introduced palliative care services and attempted establish therapeutic rapport.  Patient denies any significant changes or concerns recently.  He endorses generalized weakness but is ambulatory at home with use of a walker.  He has been receiving home health physical therapy.  Additionally, he also has help 4 hours a day from [facility].  Patient denies other distressing symptoms.  He reports good oral intake.  Weight appears stable.  Patient has advanced directives on file.  His daughter is his healthcare power of attorney.  PCP completed a MOST Form last month and patient is a DNR/DNI.  Patient speaks rather candidly about his end-of-life.  He says that everyone has to go at some point and he is ready when it is his time.  Patient says that his primary goal is to maintain quality of life for as long as possible.  He does remain in agreement with the current scope of treatment.  PLAN: -Continue current scope of treatment -DNR/DNI -Will follow -Telephone visit in 3-4 weeks   Patient expressed understanding and was in agreement with this plan. He also understands that He can call the clinic at any time with any questions, concerns, or complaints.     Time Total: 30 minutes  Visit consisted of counseling and education dealing with the complex and emotionally intense issues of symptom management and palliative care in the setting of serious and potentially life-threatening illness.Greater than 50%  of this time was spent counseling and coordinating care related to the above assessment and plan.  Signed by: Altha Harm, PhD, NP-C

## 2019-04-24 NOTE — Progress Notes (Signed)
1300: Clarified labs, treatment conditions and treatment with Dr. Grayland Ormond, Per Dr. Grayland Ormond okay to proceed with Trisenox treatment with Creatinine of 1.51, QTc of 529, and with Magnesium lab from 01/09/19. Per Dr. Grayland Ormond Magnesium to be collected on Monday 04/28/19.   1645: pt tolerated infusion well, pt denies any concerns at this time. Pt escorted to car, where his sister will be driving pt home. Pt stable at discharge.

## 2019-04-25 ENCOUNTER — Encounter: Payer: Self-pay | Admitting: Oncology

## 2019-04-25 ENCOUNTER — Other Ambulatory Visit: Payer: Self-pay

## 2019-04-25 ENCOUNTER — Inpatient Hospital Stay: Payer: Medicare Other

## 2019-04-25 ENCOUNTER — Other Ambulatory Visit: Payer: Self-pay | Admitting: Oncology

## 2019-04-25 VITALS — BP 126/77 | HR 76 | Temp 97.0°F | Resp 20

## 2019-04-25 DIAGNOSIS — C969 Malignant neoplasm of lymphoid, hematopoietic and related tissue, unspecified: Secondary | ICD-10-CM | POA: Diagnosis not present

## 2019-04-25 DIAGNOSIS — C9241 Acute promyelocytic leukemia, in remission: Secondary | ICD-10-CM | POA: Diagnosis not present

## 2019-04-25 DIAGNOSIS — C9240 Acute promyelocytic leukemia, not having achieved remission: Secondary | ICD-10-CM | POA: Diagnosis not present

## 2019-04-25 DIAGNOSIS — C61 Malignant neoplasm of prostate: Secondary | ICD-10-CM | POA: Diagnosis not present

## 2019-04-25 DIAGNOSIS — Z5111 Encounter for antineoplastic chemotherapy: Secondary | ICD-10-CM | POA: Diagnosis not present

## 2019-04-25 DIAGNOSIS — D630 Anemia in neoplastic disease: Secondary | ICD-10-CM | POA: Diagnosis not present

## 2019-04-25 DIAGNOSIS — C7931 Secondary malignant neoplasm of brain: Secondary | ICD-10-CM | POA: Diagnosis not present

## 2019-04-25 DIAGNOSIS — C3490 Malignant neoplasm of unspecified part of unspecified bronchus or lung: Secondary | ICD-10-CM | POA: Diagnosis not present

## 2019-04-25 MED ORDER — HEPARIN SOD (PORK) LOCK FLUSH 100 UNIT/ML IV SOLN
500.0000 [IU] | Freq: Once | INTRAVENOUS | Status: AC | PRN
  Administered 2019-04-25: 16:00:00 500 [IU]
  Filled 2019-04-25: qty 5

## 2019-04-25 MED ORDER — SODIUM CHLORIDE 0.9 % IV SOLN
0.1500 mg/kg/d | Freq: Every day | INTRAVENOUS | Status: DC
  Administered 2019-04-25: 13.3 mg via INTRAVENOUS
  Filled 2019-04-25: qty 13.3

## 2019-04-25 MED ORDER — PALONOSETRON HCL INJECTION 0.25 MG/5ML
0.2500 mg | Freq: Once | INTRAVENOUS | Status: AC
  Administered 2019-04-25: 14:00:00 0.25 mg via INTRAVENOUS
  Filled 2019-04-25: qty 5

## 2019-04-25 MED ORDER — HEPARIN SOD (PORK) LOCK FLUSH 100 UNIT/ML IV SOLN
INTRAVENOUS | Status: AC
  Filled 2019-04-25: qty 5

## 2019-04-25 MED ORDER — SODIUM CHLORIDE 0.9 % IV SOLN
Freq: Once | INTRAVENOUS | Status: AC
  Filled 2019-04-25: qty 250

## 2019-04-25 MED ORDER — SODIUM CHLORIDE 0.9 % IV SOLN
10.0000 mg | Freq: Once | INTRAVENOUS | Status: AC
  Administered 2019-04-25: 13:00:00 10 mg via INTRAVENOUS
  Filled 2019-04-25: qty 10

## 2019-04-25 NOTE — Progress Notes (Signed)
[place]  [phone] [phone]  ID: David [name]. OB: 1941/02/16  MR#: [id number]  HQR#:[id number]  Patient Care Team: [name], MD as PCP - General (Family Medicine) Cira Rue, RN Nurse Navigator as Registered Nurse (Medical Oncology) Acquanetta Chain, DO as Consulting Physician [facility] and Palliative Medicine) [name], MD as Consulting Physician (Hematology and Oncology)  CHIEF COMPLAINT: Acute promyelocytic leukemia in remission  INTERVAL HISTORY: Patient returns to clinic today for further evaluation and consideration of cycle 1, day 15 of arsenic.  He recent completed his 14-day course of ATRA.  Patient continues to have worsening weakness and fatigue.  He had a neurologic episode this past weekend where he lost vision in his left eye for approximately 30 minutes.  This is now resolved and not recurred.  Patient seems slightly more confused today, but otherwise feels well. He denies any recent fevers or illnesses.  He has no neurologic complaints.  He denies any chest pain, shortness of breath, cough, or hemoptysis.  He denies any nausea, vomiting, constipation, or diarrhea.  He has no urinary complaints.  Patient offers no further specific complaints today.  REVIEW OF SYSTEMS:   Review of Systems  Constitutional: Positive for malaise/fatigue. Negative for fever and weight loss.  Respiratory: Negative.  Negative for cough, hemoptysis and shortness of breath.   Cardiovascular: Negative.  Negative for chest pain and leg swelling.  Gastrointestinal: Negative.  Negative for abdominal pain and nausea.  Genitourinary: Negative.  Negative for hematuria.  Musculoskeletal: Negative.  Negative for back pain.  Skin: Negative.  Negative for rash.  Neurological: Positive for weakness. Negative for dizziness, focal weakness and headaches.  Psychiatric/Behavioral: Negative.  The patient is not nervous/anxious.     As  per HPI. Otherwise, a complete review of systems is negative.  PAST MEDICAL HISTORY: Past Medical History:  Diagnosis Date  . Anginal pain ([place])   . Arthritis    mostly hands  . Benign familial hematuria   . BPH (benign prostatic hypertrophy)   . Brain cancer ([place])    melanoma with brain met  . Coronary artery disease   . GERD (gastroesophageal reflux disease)   . High cholesterol   . Hypertension   . Hypothyroidism   . Intracerebral hemorrhage ([place])   . Melanoma ([place])   . Metastatic melanoma to lung ([place])   . Mood disorder ([place])   . Myocardial infarction ([place])   . Pacemaker    due to syncope, 3rd degree HB (upgrade to [facility]. Jude CRT-P 02/25/13 (Dr. Uvaldo Rising)  . Rectal bleed    due to NSAIDS  . Renal disorder   . Sepsis ([place]) 12/09/2018  . Skin cancer    melanoma  . Sleep apnea    uses CPAP  . Stroke [facility])     PAST SURGICAL HISTORY: Past Surgical History:  Procedure Laterality Date  . APPLICATION OF CRANIAL NAVIGATION N/A 10/15/2015   Procedure: APPLICATION OF CRANIAL NAVIGATION;  Surgeon: Erline Levine, MD;  Location: [facility];  Service: Neurosurgery;  Laterality: N/A;  . BIV PACEMAKER GENERATOR CHANGEOUT N/A 04/02/2019   Procedure: BIV PACEMAKER GENERATOR CHANGEOUT;  Surgeon: Evans Lance, MD;  Location: [facility] CV LAB;  Service: Cardiovascular;  Laterality: N/A;  . CARDIAC CATHETERIZATION  2013  . CARDIAC SURGERY     bypass X 2  . COLONOSCOPY WITH PROPOFOL N/A 05/08/2016   diverticulosis, int hem, no f/u needed [name])  . CORONARY ARTERY BYPASS GRAFT  2013  LIMA-LAD, SVG-PDA 03/27/11 (Dr. Francee Gentile)  . CRANIOTOMY N/A 10/15/2015   Procedure: LEFT FRONTAL CRANIOTOMY TUMOR EXCISION with Curve;  Surgeon: Erline Levine, MD;  Location: [facility];  Service: Neurosurgery;  Laterality: N/A;  CRANIOTOMY TUMOR EXCISION  . EYE SURGERY    . FOOT SURGERY  08/2010  . JOINT REPLACEMENT Left    partial knee  . KNEE ARTHROSCOPY  12/2008  . LYMPH NODE BIOPSY    . PACEMAKER  INSERTION    . TONSILLECTOMY      FAMILY HISTORY: Family History  Problem Relation Age of Onset  . Cancer Mother 75       lung   . Hypertension Mother   . Hypertension Father   . Heart attack Father   . Heart disease Father   . Rheum arthritis Sister   . Cancer Maternal Grandmother     ADVANCED DIRECTIVES (Y/N):  N  HEALTH MAINTENANCE: Social History   Tobacco Use  . Smoking status: Former Smoker    Packs/day: 1.00    Years: 3.00    Pack years: 3.00    Quit date: 04/10/1957    Years since quitting: 62.0  . Smokeless tobacco: Never Used  Substance Use Topics  . Alcohol use: Yes    Alcohol/week: 14.0 standard drinks    Types: 10 Glasses of wine, 4 Cans of beer per week  . Drug use: No     Colonoscopy:  PAP:  Bone density:  Lipid panel:  Allergies  Allergen Reactions  . Ciprofloxacin Other (See Comments)    Tendonitis  . Ezetimibe Other (See Comments)    Unknown reaction  . Simvastatin Other (See Comments)    Reaction:  Gave pt a fever  Fever - temp of 103.   In 2003    Current Outpatient Medications  Medication Sig Dispense Refill  . acetaminophen (TYLENOL) 325 MG tablet Take 2 tablets (650 mg total) by mouth every 6 (six) hours as needed for mild pain (or Fever >/= 101).    Marland Kitchen acyclovir (ZOVIRAX) 400 MG tablet Take 400 mg by mouth 2 (two) times daily.    Marland Kitchen allopurinol (ZYLOPRIM) 100 MG tablet Take 2 tablets (200 mg total) by mouth daily. 180 tablet 1  . aspirin EC 81 MG tablet Take 81 mg by mouth every evening.     . Blood Glucose Monitoring Suppl (ONETOUCH VERIO) w/Device KIT 1 kit by Does not apply route as directed. E11.65 1 kit 0  . carboxymethylcellulose (REFRESH TEARS) 0.5 % SOLN Apply to eye.    . Cholecalciferol (VITAMIN D3) 25 MCG (1000 UT) CAPS Take 1 capsule (1,000 Units total) by mouth daily. 30 capsule 0  . citalopram (CELEXA) 10 MG tablet Take 1 tablet (10 mg total) by mouth daily. 90 tablet 1  . colchicine 0.6 MG tablet Take 1 tablet (0.6 mg  total) by mouth daily as needed (gout flare). Take 2 tablets on the first day of flare 30 tablet 0  . glucose blood test strip E11.65 Use as instructed to check sugars three time daily and as needed 100 each 12  . HUMALOG KWIKPEN 100 UNIT/ML KwikPen     . hydrocortisone cream 1 % Apply topically.    . insulin aspart (NOVOLOG FLEXPEN) 100 UNIT/ML FlexPen Use three times daily with meals as needed per sliding scale: 2 units for every 50 units over 150 (2u for 150-199, 4u for 200-249, 6u for 250-299, etc) 3 mL 1  . ketoconazole (NIZORAL) 2 % shampoo Apply topically.    Marland Kitchen  Lancets (ONETOUCH ULTRASOFT) lancets E11.65 Use as instructed to check sugars three time daily and as needed 100 each 12  . levothyroxine (SYNTHROID) 137 MCG tablet TAKE 1 TABLET BY MOUTH ONCE DAILY BEFORE BREAKFAST (Patient taking differently: Take 137 mcg by mouth daily before breakfast. ) 30 tablet 11  . methylphenidate (RITALIN) 5 MG tablet Take 1 tablet (5 mg total) by mouth 2 (two) times daily with breakfast and lunch. 60 tablet 0  . metoprolol succinate (TOPROL-XL) 50 MG 24 hr tablet TAKE 1 AND 1/2 TABLET (75 MG) BY MOUTH EVERY DAY WITH OR IMMEDIATELY FOLLOWING A MEAL *DO NOT CRUSH* (Patient taking differently: Take 50 mg by mouth daily. ) 50 tablet 11  . mineral oil-hydrophilic petrolatum (AQUAPHOR) ointment Apply topically.    . ondansetron (ZOFRAN-ODT) 4 MG disintegrating tablet Take 1 tablet (4 mg total) by mouth every 8 (eight) hours as needed for nausea or vomiting. 60 tablet 2  . polyethylene glycol (MIRALAX / GLYCOLAX) 17 g packet Take 17 g by mouth 2 (two) times daily. (Patient taking differently: Take 17 g by mouth daily. ) 14 each 0  . predniSONE (DELTASONE) 5 MG tablet Take 10 mg by mouth daily.     Marland Kitchen senna (SENOKOT) 8.6 MG TABS tablet Take 1 tablet (8.6 mg total) by mouth at bedtime. 120 tablet 0  . senna-docusate (SENOKOT-S) 8.6-50 MG tablet Take by mouth.    . sodium chloride (ALTAMIST SPRAY) 0.65 % nasal spray  Place into the nose.    . testosterone cypionate (DEPOTESTOSTERONE CYPIONATE) 200 MG/ML injection Inject 100 mg into the muscle every 14 (fourteen) days.    . traZODone (DESYREL) 50 MG tablet Take 0.5-1 tablets (25-50 mg total) by mouth at bedtime. (Patient taking differently: Take 25 mg by mouth at bedtime as needed for sleep. ) 30 tablet 1  . tretinoin (VESANOID) 10 MG capsule     . Turmeric Curcumin 500 MG CAPS Take by mouth.     No current facility-administered medications for this visit.   Facility-Administered Medications Ordered in Other Visits  Medication Dose Route Frequency Provider Last Rate Last Admin  . 0.9 %  sodium chloride infusion  1,000 mL Intravenous Once [name], MD      . arsenic trioxide (TRISENOX) 13.3 mg in sodium chloride 0.9 % 250 mL chemo Infusion  0.15 mg/kg/day (Treatment Plan Recorded) Intravenous Daily [name], MD   Stopped at 04/21/19 1404  . arsenic trioxide (TRISENOX) 13.3 mg in sodium chloride 0.9 % 250 mL chemo Infusion  0.15 mg/kg/day (Treatment Plan Recorded) Intravenous Daily [name], MD 132 mL/hr at 04/28/19 1218 13.3 mg at 04/28/19 1218  . heparin lock flush 100 unit/mL  500 Units Intracatheter Once PRN [name], MD        OBJECTIVE: Vitals:   04/28/19 1015  BP: 135/86  Pulse: 75  Resp: 16  Temp: (!) 96.6 F (35.9 C)  SpO2: 99%     Body mass index is 30.63 kg/m.    ECOG FS:1 - Symptomatic but completely ambulatory  General: Thin, no acute distress. Eyes: Pink conjunctiva, anicteric sclera. HEENT: Normocephalic, moist mucous membranes. Lungs: No audible wheezing or coughing. Heart: Regular rate and rhythm. Abdomen: Soft, nontender, no obvious distention. Musculoskeletal: No edema, cyanosis, or clubbing. Neuro: Alert, answering all questions appropriately although mildly confused. Cranial nerves grossly intact. Skin: No rashes or petechiae noted. Psych: Normal affect.  LAB RESULTS:  Lab Results    Component Value Date   NA  137 04/28/2019   K 4.9 04/28/2019   CL 108 04/28/2019   CO2 21 (L) 04/28/2019   GLUCOSE 81 04/28/2019   BUN 44 (H) 04/28/2019   CREATININE 1.40 (H) 04/28/2019   CALCIUM 8.9 04/28/2019   PROT 5.8 (L) 04/28/2019   ALBUMIN 3.3 (L) 04/28/2019   AST 36 04/28/2019   ALT 29 04/28/2019   ALKPHOS 56 04/28/2019   BILITOT 0.5 04/28/2019   GFRNONAA 47 (L) 04/28/2019   GFRAA 55 (L) 04/28/2019    Lab Results  Component Value Date   WBC 3.5 (L) 04/28/2019   NEUTROABS 3.0 04/28/2019   HGB 11.1 (L) 04/28/2019   HCT 35.1 (L) 04/28/2019   MCV 105.4 (H) 04/28/2019   PLT 164 04/28/2019     STUDIES: EP PPM/ICD IMPLANT  Result Date: 04/02/2019 Conclusion: successful Biv PPM gen change out in a patient with CHB and chronic systolic heart failure, s/p PPM insertion with the old device at [facility]. Gregg Taylor,M.D.  CUP PACEART INCLINIC DEVICE CHECK  Result Date: 04/17/2019 Pacemaker check in clinic. Normal device function. Thresholds, sensing, impedances consistent with previous measurements. Device programmed to maximize longevity. No mode switch or high ventricular rates noted. Device programmed at appropriate safety margins. Histogram distribution appropriate for patient activity level. Device programmed to optimize intrinsic conduction. Estimated longevity 5 yrs 10 months. Patient enrolled in remote follow-up 07/01/19. Follow up with [name] 07/08/19 in [place]. Patient education completed. Pacemaker check in clinic. Normal device function. Thresholds, sensing, impedances consistent with previous measurements. Device programmed to maximize longevity. No mode switch or high ventricular rates noted. Device programmed at appropriate safety margins. Histogram distribution appropriate for patient activity level. Device programmed to optimize intrinsic conduction. Estimated longevity 5 yrs 10 months. Patient enrolled in remote follow-up 07/01/19. Follow up with [name] 07/08/19 in  [place]. Patient education completed.   ASSESSMENT: Acute promyelocytic leukemia in remission  PLAN:    1. Acute promyelocytic leukemia in remission: Patient completed his induction chemotherapy at [facility] in approximately November 2020.  Peripheral blood FISH-PML t(15:17) noted complete molecular remission.  Continue with maintenance chemotherapy using ATRA 14 days on 14 days off along with arsenic trioxide at 0.15 mg/kg/day Monday through Friday for 4 weeks then 4 weeks off.  Each cycle will be a total of 8 weeks.  Pending patient's toleration of treatment, will plan to do a total of 4 cycles or 32 weeks of treatment.  Patient recently completed 14 days of ATRA. Proceed with cycle 1, day 15 of arsenic today.  Return to clinic Tuesday through Friday for arsenic only.  Patient will then return to clinic in 1 week for further evaluation and consideration of cycle 1, day 22.    2.  Nausea: Continue ondansetron as needed. 3.  Rash: Continue topical treatments as directed.  Can also consider prednisone taper if necessary. 4.  Cardiac disease: Given patient's biventricular pacing, he has a significantly abnormal EKG with a prolonged QTC of approximately 560.  Although treatment should be discontinued with a QTC greater than 500, this is patient's baseline and will continue to monitor with weekly EKGs.  Continue close follow-up with cardiology as scheduled.  EKG from today appears to be at patient's baseline. 5.  Melanoma: No obvious evidence of recurrence.  Monitor. 6.  Prostate cancer: Patient's most recent PCA in June 2020 was reported at 4.35. 7.  Biventricular pacer: Recently evaluated.  Patient has underlying hematoma with surrounding ecchymoses.  Proceed with treatment as above. 8.  Weakness  and fatigue: Likely multifactorial.  Continue 1 L of fluids on Mondays and Thursdays of each treatment. 9.  Renal insufficiency: Patient creatinine is slightly improved at 1.40. 10.  Leukopenia:  Chronic and unchanged. 11.  Visual disturbance/confusion: Will get repeat MRI of the brain in the next week.  Patient expressed understanding and was in agreement with this plan. He also understands that He can call clinic at any time with any questions, concerns, or complaints.   Cancer Staging Malignant melanoma [facility]) Staging form: Melanoma of the Skin, AJCC 7th Edition - Clinical stage from 09/21/2015: Stage IV ([place], [place], M1c) - Signed by [name], MD on 10/09/2015   [name], MD   04/28/2019 1:33 PM

## 2019-04-25 NOTE — Progress Notes (Signed)
Patient prescreened for appointment. Patient has no concerns or questions.  

## 2019-04-28 ENCOUNTER — Inpatient Hospital Stay (HOSPITAL_BASED_OUTPATIENT_CLINIC_OR_DEPARTMENT_OTHER): Payer: Medicare Other | Admitting: Oncology

## 2019-04-28 ENCOUNTER — Inpatient Hospital Stay: Payer: Medicare Other | Admitting: *Deleted

## 2019-04-28 ENCOUNTER — Other Ambulatory Visit: Payer: Self-pay

## 2019-04-28 ENCOUNTER — Inpatient Hospital Stay: Payer: Medicare Other

## 2019-04-28 VITALS — BP 135/86 | HR 75 | Temp 96.6°F | Resp 16 | Wt 210.4 lb

## 2019-04-28 DIAGNOSIS — Z95828 Presence of other vascular implants and grafts: Secondary | ICD-10-CM

## 2019-04-28 DIAGNOSIS — Z5111 Encounter for antineoplastic chemotherapy: Secondary | ICD-10-CM | POA: Diagnosis not present

## 2019-04-28 DIAGNOSIS — C9241 Acute promyelocytic leukemia, in remission: Secondary | ICD-10-CM

## 2019-04-28 LAB — CBC WITH DIFFERENTIAL/PLATELET
Abs Immature Granulocytes: 0.03 10*3/uL (ref 0.00–0.07)
Basophils Absolute: 0 10*3/uL (ref 0.0–0.1)
Basophils Relative: 0 %
Eosinophils Absolute: 0.1 10*3/uL (ref 0.0–0.5)
Eosinophils Relative: 2 %
HCT: 35.1 % — ABNORMAL LOW (ref 39.0–52.0)
Hemoglobin: 11.1 g/dL — ABNORMAL LOW (ref 13.0–17.0)
Immature Granulocytes: 1 %
Lymphocytes Relative: 8 %
Lymphs Abs: 0.3 10*3/uL — ABNORMAL LOW (ref 0.7–4.0)
MCH: 33.3 pg (ref 26.0–34.0)
MCHC: 31.6 g/dL (ref 30.0–36.0)
MCV: 105.4 fL — ABNORMAL HIGH (ref 80.0–100.0)
Monocytes Absolute: 0.2 10*3/uL (ref 0.1–1.0)
Monocytes Relative: 4 %
Neutro Abs: 3 10*3/uL (ref 1.7–7.7)
Neutrophils Relative %: 85 %
Platelets: 164 10*3/uL (ref 150–400)
RBC: 3.33 MIL/uL — ABNORMAL LOW (ref 4.22–5.81)
RDW: 18.5 % — ABNORMAL HIGH (ref 11.5–15.5)
WBC: 3.5 10*3/uL — ABNORMAL LOW (ref 4.0–10.5)
nRBC: 1.4 % — ABNORMAL HIGH (ref 0.0–0.2)

## 2019-04-28 LAB — COMPREHENSIVE METABOLIC PANEL
ALT: 29 U/L (ref 0–44)
AST: 36 U/L (ref 15–41)
Albumin: 3.3 g/dL — ABNORMAL LOW (ref 3.5–5.0)
Alkaline Phosphatase: 56 U/L (ref 38–126)
Anion gap: 8 (ref 5–15)
BUN: 44 mg/dL — ABNORMAL HIGH (ref 8–23)
CO2: 21 mmol/L — ABNORMAL LOW (ref 22–32)
Calcium: 8.9 mg/dL (ref 8.9–10.3)
Chloride: 108 mmol/L (ref 98–111)
Creatinine, Ser: 1.4 mg/dL — ABNORMAL HIGH (ref 0.61–1.24)
GFR calc Af Amer: 55 mL/min — ABNORMAL LOW (ref >60)
GFR calc non Af Amer: 47 mL/min — ABNORMAL LOW (ref >60)
Glucose, Bld: 81 mg/dL (ref 70–99)
Potassium: 4.9 mmol/L (ref 3.5–5.1)
Sodium: 137 mmol/L (ref 135–145)
Total Bilirubin: 0.5 mg/dL (ref 0.3–1.2)
Total Protein: 5.8 g/dL — ABNORMAL LOW (ref 6.5–8.1)

## 2019-04-28 MED ORDER — SODIUM CHLORIDE 0.9 % IV SOLN
Freq: Once | INTRAVENOUS | Status: AC
  Filled 2019-04-28: qty 250

## 2019-04-28 MED ORDER — PALONOSETRON HCL INJECTION 0.25 MG/5ML
0.2500 mg | Freq: Once | INTRAVENOUS | Status: AC
  Administered 2019-04-28: 12:00:00 0.25 mg via INTRAVENOUS
  Filled 2019-04-28: qty 5

## 2019-04-28 MED ORDER — HEPARIN SOD (PORK) LOCK FLUSH 100 UNIT/ML IV SOLN
INTRAVENOUS | Status: AC
  Filled 2019-04-28: qty 5

## 2019-04-28 MED ORDER — SODIUM CHLORIDE 0.9 % IV SOLN
0.1500 mg/kg/d | Freq: Every day | INTRAVENOUS | Status: DC
  Administered 2019-04-28: 13.3 mg via INTRAVENOUS
  Filled 2019-04-28: qty 13.3

## 2019-04-28 MED ORDER — SODIUM CHLORIDE 0.9 % IV SOLN
10.0000 mg | Freq: Once | INTRAVENOUS | Status: AC
  Administered 2019-04-28: 12:00:00 10 mg via INTRAVENOUS
  Filled 2019-04-28: qty 10

## 2019-04-28 MED ORDER — HEPARIN SOD (PORK) LOCK FLUSH 100 UNIT/ML IV SOLN
500.0000 [IU] | Freq: Once | INTRAVENOUS | Status: AC | PRN
  Administered 2019-04-28: 15:00:00 500 [IU]
  Filled 2019-04-28: qty 5

## 2019-04-28 MED ORDER — SODIUM CHLORIDE 0.9% FLUSH
10.0000 mL | Freq: Once | INTRAVENOUS | Status: AC
  Administered 2019-04-28: 10 mL via INTRAVENOUS
  Filled 2019-04-28: qty 10

## 2019-04-28 NOTE — Progress Notes (Signed)
Patient is here today for follow up, he mentions he is not feeling good today but denies any pain or discomfort. Call daughter during visit

## 2019-04-29 ENCOUNTER — Inpatient Hospital Stay: Payer: Medicare Other

## 2019-04-29 ENCOUNTER — Other Ambulatory Visit: Payer: Self-pay

## 2019-04-29 VITALS — BP 135/80 | HR 75 | Temp 97.0°F | Resp 19

## 2019-04-29 DIAGNOSIS — Z5111 Encounter for antineoplastic chemotherapy: Secondary | ICD-10-CM | POA: Diagnosis not present

## 2019-04-29 DIAGNOSIS — C9241 Acute promyelocytic leukemia, in remission: Secondary | ICD-10-CM | POA: Diagnosis not present

## 2019-04-29 MED ORDER — SODIUM CHLORIDE 0.9 % IV SOLN
0.1500 mg/kg/d | Freq: Every day | INTRAVENOUS | Status: DC
  Administered 2019-04-29: 14:00:00 13.3 mg via INTRAVENOUS
  Filled 2019-04-29: qty 13.3

## 2019-04-29 MED ORDER — SODIUM CHLORIDE 0.9 % IV SOLN
10.0000 mg | Freq: Once | INTRAVENOUS | Status: AC
  Administered 2019-04-29: 10 mg via INTRAVENOUS
  Filled 2019-04-29: qty 10

## 2019-04-29 MED ORDER — SODIUM CHLORIDE 0.9 % IV SOLN
Freq: Once | INTRAVENOUS | Status: AC
  Filled 2019-04-29: qty 250

## 2019-04-29 MED ORDER — HEPARIN SOD (PORK) LOCK FLUSH 100 UNIT/ML IV SOLN
500.0000 [IU] | Freq: Once | INTRAVENOUS | Status: AC | PRN
  Administered 2019-04-29: 500 [IU]
  Filled 2019-04-29: qty 5

## 2019-04-29 MED ORDER — HEPARIN SOD (PORK) LOCK FLUSH 100 UNIT/ML IV SOLN
INTRAVENOUS | Status: AC
  Filled 2019-04-29: qty 5

## 2019-04-30 ENCOUNTER — Inpatient Hospital Stay: Payer: Medicare Other

## 2019-04-30 ENCOUNTER — Other Ambulatory Visit: Payer: Self-pay

## 2019-04-30 VITALS — BP 135/79 | HR 76 | Temp 97.0°F | Resp 19

## 2019-04-30 DIAGNOSIS — C9241 Acute promyelocytic leukemia, in remission: Secondary | ICD-10-CM | POA: Diagnosis not present

## 2019-04-30 DIAGNOSIS — D630 Anemia in neoplastic disease: Secondary | ICD-10-CM | POA: Diagnosis not present

## 2019-04-30 DIAGNOSIS — Z5111 Encounter for antineoplastic chemotherapy: Secondary | ICD-10-CM | POA: Diagnosis not present

## 2019-04-30 DIAGNOSIS — C61 Malignant neoplasm of prostate: Secondary | ICD-10-CM | POA: Diagnosis not present

## 2019-04-30 DIAGNOSIS — C7931 Secondary malignant neoplasm of brain: Secondary | ICD-10-CM | POA: Diagnosis not present

## 2019-04-30 DIAGNOSIS — C969 Malignant neoplasm of lymphoid, hematopoietic and related tissue, unspecified: Secondary | ICD-10-CM | POA: Diagnosis not present

## 2019-04-30 DIAGNOSIS — C3490 Malignant neoplasm of unspecified part of unspecified bronchus or lung: Secondary | ICD-10-CM | POA: Diagnosis not present

## 2019-04-30 DIAGNOSIS — C9240 Acute promyelocytic leukemia, not having achieved remission: Secondary | ICD-10-CM | POA: Diagnosis not present

## 2019-04-30 MED ORDER — HEPARIN SOD (PORK) LOCK FLUSH 100 UNIT/ML IV SOLN
INTRAVENOUS | Status: AC
  Filled 2019-04-30: qty 5

## 2019-04-30 MED ORDER — SODIUM CHLORIDE 0.9 % IV SOLN
0.1500 mg/kg/d | Freq: Every day | INTRAVENOUS | Status: DC
  Administered 2019-04-30: 13.3 mg via INTRAVENOUS
  Filled 2019-04-30: qty 13.3

## 2019-04-30 MED ORDER — SODIUM CHLORIDE 0.9 % IV SOLN
10.0000 mg | Freq: Once | INTRAVENOUS | Status: AC
  Administered 2019-04-30: 10 mg via INTRAVENOUS
  Filled 2019-04-30: qty 10

## 2019-04-30 MED ORDER — PALONOSETRON HCL INJECTION 0.25 MG/5ML
0.2500 mg | Freq: Once | INTRAVENOUS | Status: AC
  Administered 2019-04-30: 0.25 mg via INTRAVENOUS
  Filled 2019-04-30: qty 5

## 2019-04-30 MED ORDER — SODIUM CHLORIDE 0.9 % IV SOLN
Freq: Once | INTRAVENOUS | Status: AC
  Filled 2019-04-30: qty 250

## 2019-04-30 MED ORDER — HEPARIN SOD (PORK) LOCK FLUSH 100 UNIT/ML IV SOLN
500.0000 [IU] | Freq: Once | INTRAVENOUS | Status: AC | PRN
  Administered 2019-04-30: 16:00:00 500 [IU]
  Filled 2019-04-30: qty 5

## 2019-05-01 ENCOUNTER — Other Ambulatory Visit: Payer: Self-pay

## 2019-05-01 ENCOUNTER — Inpatient Hospital Stay: Payer: Medicare Other

## 2019-05-01 VITALS — BP 158/88 | HR 76 | Temp 97.1°F | Resp 20

## 2019-05-01 DIAGNOSIS — C439 Malignant melanoma of skin, unspecified: Secondary | ICD-10-CM

## 2019-05-01 DIAGNOSIS — C9241 Acute promyelocytic leukemia, in remission: Secondary | ICD-10-CM

## 2019-05-01 DIAGNOSIS — Z5111 Encounter for antineoplastic chemotherapy: Secondary | ICD-10-CM | POA: Diagnosis not present

## 2019-05-01 MED ORDER — HEPARIN SOD (PORK) LOCK FLUSH 100 UNIT/ML IV SOLN
500.0000 [IU] | Freq: Once | INTRAVENOUS | Status: AC | PRN
  Administered 2019-05-01: 16:00:00 500 [IU]
  Filled 2019-05-01: qty 5

## 2019-05-01 MED ORDER — SODIUM CHLORIDE 0.9 % IV SOLN
Freq: Once | INTRAVENOUS | Status: AC
  Filled 2019-05-01: qty 250

## 2019-05-01 MED ORDER — SODIUM CHLORIDE 0.9 % IV SOLN
10.0000 mg | Freq: Once | INTRAVENOUS | Status: AC
  Administered 2019-05-01: 13:00:00 10 mg via INTRAVENOUS
  Filled 2019-05-01: qty 10

## 2019-05-01 MED ORDER — SODIUM CHLORIDE 0.9 % IV SOLN
0.1500 mg/kg/d | Freq: Every day | INTRAVENOUS | Status: DC
  Administered 2019-05-01: 14:00:00 13.3 mg via INTRAVENOUS
  Filled 2019-05-01: qty 13.3

## 2019-05-01 MED ORDER — SODIUM CHLORIDE 0.9 % IV SOLN
Freq: Once | INTRAVENOUS | Status: AC
  Administered 2019-05-01: 13:00:00 1000 mL via INTRAVENOUS
  Filled 2019-05-01: qty 250

## 2019-05-01 MED ORDER — HEPARIN SOD (PORK) LOCK FLUSH 100 UNIT/ML IV SOLN
INTRAVENOUS | Status: AC
  Filled 2019-05-01: qty 5

## 2019-05-01 MED ORDER — SODIUM CHLORIDE 0.9% FLUSH
10.0000 mL | INTRAVENOUS | Status: DC | PRN
  Administered 2019-05-01: 10 mL
  Filled 2019-05-01: qty 10

## 2019-05-02 ENCOUNTER — Inpatient Hospital Stay: Payer: Medicare Other

## 2019-05-02 ENCOUNTER — Other Ambulatory Visit: Payer: Self-pay

## 2019-05-02 VITALS — BP 132/87 | HR 76 | Temp 97.0°F | Resp 19

## 2019-05-02 DIAGNOSIS — Z5111 Encounter for antineoplastic chemotherapy: Secondary | ICD-10-CM | POA: Diagnosis not present

## 2019-05-02 DIAGNOSIS — C9241 Acute promyelocytic leukemia, in remission: Secondary | ICD-10-CM

## 2019-05-02 MED ORDER — SODIUM CHLORIDE 0.9 % IV SOLN
Freq: Once | INTRAVENOUS | Status: AC
  Filled 2019-05-02: qty 250

## 2019-05-02 MED ORDER — PALONOSETRON HCL INJECTION 0.25 MG/5ML
0.2500 mg | Freq: Once | INTRAVENOUS | Status: AC
  Administered 2019-05-02: 0.25 mg via INTRAVENOUS
  Filled 2019-05-02: qty 5

## 2019-05-02 MED ORDER — SODIUM CHLORIDE 0.9 % IV SOLN
0.1500 mg/kg/d | Freq: Every day | INTRAVENOUS | Status: DC
  Administered 2019-05-02: 13.3 mg via INTRAVENOUS
  Filled 2019-05-02: qty 13.3

## 2019-05-02 MED ORDER — HEPARIN SOD (PORK) LOCK FLUSH 100 UNIT/ML IV SOLN
500.0000 [IU] | Freq: Once | INTRAVENOUS | Status: AC | PRN
  Administered 2019-05-02: 500 [IU]
  Filled 2019-05-02: qty 5

## 2019-05-02 MED ORDER — SODIUM CHLORIDE 0.9 % IV SOLN
10.0000 mg | Freq: Once | INTRAVENOUS | Status: AC
  Administered 2019-05-02: 10 mg via INTRAVENOUS
  Filled 2019-05-02: qty 1

## 2019-05-02 MED ORDER — HEPARIN SOD (PORK) LOCK FLUSH 100 UNIT/ML IV SOLN
INTRAVENOUS | Status: AC
  Filled 2019-05-02: qty 5

## 2019-05-03 NOTE — Progress Notes (Signed)
David [name]  [phone] [phone]  ID: [name] OB: 1940-05-06  MR#: [id number]  IZT#:[id number]  Patient Care Team: [name], MD as PCP - General (Family Medicine) Cira Rue, RN Nurse Navigator as Registered Nurse (Medical Oncology) Acquanetta Chain, DO as Consulting Physician [facility] and Palliative Medicine) [name], MD as Consulting Physician (Hematology and Oncology)  CHIEF COMPLAINT: Acute promyelocytic leukemia in remission  INTERVAL HISTORY: Patient returns to clinic today for further evaluation and consideration of cycle 1, day 22 of arsenic.  He continues to have significant weakness and fatigue, but otherwise feels well.  He has had no further neurologic incidences.  He denies any recent fevers or illnesses.  He has no neurologic complaints.  He denies any chest pain, shortness of breath, cough, or hemoptysis.  He denies any nausea, vomiting, constipation, or diarrhea.  He has no urinary complaints.  Patient offers no further specific complaints today.  REVIEW OF SYSTEMS:   Review of Systems  Constitutional: Positive for malaise/fatigue. Negative for fever and weight loss.  Respiratory: Negative.  Negative for cough, hemoptysis and shortness of breath.   Cardiovascular: Negative.  Negative for chest pain and leg swelling.  Gastrointestinal: Negative.  Negative for abdominal pain and nausea.  Genitourinary: Negative.  Negative for hematuria.  Musculoskeletal: Negative.  Negative for back pain.  Skin: Negative.  Negative for rash.  Neurological: Positive for weakness. Negative for dizziness, focal weakness and headaches.  Psychiatric/Behavioral: Negative.  The patient is not nervous/anxious.     As per HPI. Otherwise, a complete review of systems is negative.  PAST MEDICAL HISTORY: Past Medical History:  Diagnosis Date  . Anginal pain ([place])   . Arthritis    mostly hands  . Benign familial  hematuria   . BPH (benign prostatic hypertrophy)   . Brain cancer ([place])    melanoma with brain met  . Coronary artery disease   . GERD (gastroesophageal reflux disease)   . High cholesterol   . Hypertension   . Hypothyroidism   . Intracerebral hemorrhage ([place])   . Melanoma ([place])   . Metastatic melanoma to lung ([place])   . Mood disorder ([place])   . Myocardial infarction ([place])   . Pacemaker    due to syncope, 3rd degree HB (upgrade to [facility]. Jude CRT-P 02/25/13 (Dr. Uvaldo Rising)  . Rectal bleed    due to NSAIDS  . Renal disorder   . Sepsis ([place]) 12/09/2018  . Skin cancer    melanoma  . Sleep apnea    uses CPAP  . Stroke [facility])     PAST SURGICAL HISTORY: Past Surgical History:  Procedure Laterality Date  . APPLICATION OF CRANIAL NAVIGATION N/A 10/15/2015   Procedure: APPLICATION OF CRANIAL NAVIGATION;  Surgeon: Erline Levine, MD;  Location: [place] [facility];  Service: Neurosurgery;  Laterality: N/A;  . BIV PACEMAKER GENERATOR CHANGEOUT N/A 04/02/2019   Procedure: BIV PACEMAKER GENERATOR CHANGEOUT;  Surgeon: Evans Lance, MD;  Location: [place] CV LAB;  Service: Cardiovascular;  Laterality: N/A;  . CARDIAC CATHETERIZATION  2013  . CARDIAC SURGERY     bypass X 2  . COLONOSCOPY WITH PROPOFOL N/A 05/08/2016   diverticulosis, int hem, no f/u needed [name])  . CORONARY ARTERY BYPASS GRAFT  2013   LIMA-LAD, SVG-PDA 03/27/11 (Dr. Francee Gentile)  . CRANIOTOMY N/A 10/15/2015   Procedure: LEFT FRONTAL CRANIOTOMY TUMOR EXCISION with Curve;  Surgeon: Erline Levine, MD;  Location: [place] [facility]UR[facility] ORS;  Service: Neurosurgery;  Laterality:  N/A;  CRANIOTOMY TUMOR EXCISION  . EYE SURGERY    . FOOT SURGERY  08/2010  . JOINT REPLACEMENT Left    partial knee  . KNEE ARTHROSCOPY  12/2008  . LYMPH NODE BIOPSY    . PACEMAKER INSERTION    . TONSILLECTOMY      FAMILY HISTORY: Family History  Problem Relation Age of Onset  . Cancer Mother 2       lung   . Hypertension Mother   . Hypertension Father   . Heart attack  Father   . Heart disease Father   . Rheum arthritis Sister   . Cancer Maternal Grandmother     ADVANCED DIRECTIVES (Y/N):  N  HEALTH MAINTENANCE: Social History   Tobacco Use  . Smoking status: Former Smoker    Packs/day: 1.00    Years: 3.00    Pack years: 3.00    Quit date: 04/10/1957    Years since quitting: 62.1  . Smokeless tobacco: Never Used  Substance Use Topics  . Alcohol use: Yes    Alcohol/week: 14.0 standard drinks    Types: 10 Glasses of wine, 4 Cans of beer per week  . Drug use: No     Colonoscopy:  PAP:  Bone density:  Lipid panel:  Allergies  Allergen Reactions  . Ciprofloxacin Other (See Comments)    Tendonitis  . Ezetimibe Other (See Comments)    Unknown reaction  . Simvastatin Other (See Comments)    Reaction:  Gave pt a fever  Fever - temp of 103.   In 2003    Current Outpatient Medications  Medication Sig Dispense Refill  . acetaminophen (TYLENOL) 325 MG tablet Take 2 tablets (650 mg total) by mouth every 6 (six) hours as needed for mild pain (or Fever >/= 101).    Marland Kitchen acyclovir (ZOVIRAX) 400 MG tablet Take 400 mg by mouth 2 (two) times daily.    Marland Kitchen allopurinol (ZYLOPRIM) 100 MG tablet Take 2 tablets (200 mg total) by mouth daily. 180 tablet 1  . aspirin EC 81 MG tablet Take 81 mg by mouth every evening.     . Blood Glucose Monitoring Suppl (ONETOUCH VERIO) w/Device KIT 1 kit by Does not apply route as directed. E11.65 1 kit 0  . carboxymethylcellulose (REFRESH TEARS) 0.5 % SOLN Apply to eye.    . Cholecalciferol (VITAMIN D3) 25 MCG (1000 UT) CAPS Take 1 capsule (1,000 Units total) by mouth daily. 30 capsule 0  . citalopram (CELEXA) 10 MG tablet Take 1 tablet (10 mg total) by mouth daily. 90 tablet 1  . colchicine 0.6 MG tablet Take 1 tablet (0.6 mg total) by mouth daily as needed (gout flare). Take 2 tablets on the first day of flare 30 tablet 0  . glucose blood test strip E11.65 Use as instructed to check sugars three time daily and as needed 100  each 12  . HUMALOG KWIKPEN 100 UNIT/ML KwikPen     . hydrocortisone cream 1 % Apply topically.    . insulin aspart (NOVOLOG FLEXPEN) 100 UNIT/ML FlexPen Use three times daily with meals as needed per sliding scale: 2 units for every 50 units over 150 (2u for 150-199, 4u for 200-249, 6u for 250-299, etc) 3 mL 1  . ketoconazole (NIZORAL) 2 % shampoo Apply topically.    . Lancets (ONETOUCH ULTRASOFT) lancets E11.65 Use as instructed to check sugars three time daily and as needed 100 each 12  . levothyroxine (SYNTHROID) 137 MCG tablet TAKE 1 TABLET BY  MOUTH ONCE DAILY BEFORE BREAKFAST (Patient taking differently: Take 137 mcg by mouth daily before breakfast. ) 30 tablet 11  . methylphenidate (RITALIN) 5 MG tablet Take 1 tablet (5 mg total) by mouth 2 (two) times daily with breakfast and lunch. 60 tablet 0  . metoprolol succinate (TOPROL-XL) 50 MG 24 hr tablet TAKE 1 AND 1/2 TABLET (75 MG) BY MOUTH EVERY DAY WITH OR IMMEDIATELY FOLLOWING A MEAL *DO NOT CRUSH* (Patient taking differently: Take 50 mg by mouth daily. ) 50 tablet 11  . mineral oil-hydrophilic petrolatum (AQUAPHOR) ointment Apply topically.    . ondansetron (ZOFRAN-ODT) 4 MG disintegrating tablet Take 1 tablet (4 mg total) by mouth every 8 (eight) hours as needed for nausea or vomiting. 60 tablet 2  . polyethylene glycol (MIRALAX / GLYCOLAX) 17 g packet Take 17 g by mouth 2 (two) times daily. (Patient taking differently: Take 17 g by mouth daily. ) 14 each 0  . predniSONE (DELTASONE) 5 MG tablet Take 10 mg by mouth daily.     Marland Kitchen senna (SENOKOT) 8.6 MG TABS tablet Take 1 tablet (8.6 mg total) by mouth at bedtime. 120 tablet 0  . senna-docusate (SENOKOT-S) 8.6-50 MG tablet Take by mouth.    . sodium chloride (ALTAMIST SPRAY) 0.65 % nasal spray Place into the nose.    . testosterone cypionate (DEPOTESTOSTERONE CYPIONATE) 200 MG/ML injection Inject 100 mg into the muscle every 14 (fourteen) days.    . traZODone (DESYREL) 50 MG tablet Take 0.5-1  tablets (25-50 mg total) by mouth at bedtime. (Patient taking differently: Take 25 mg by mouth at bedtime as needed for sleep. ) 30 tablet 1  . tretinoin (VESANOID) 10 MG capsule     . Turmeric Curcumin 500 MG CAPS Take by mouth.     No current facility-administered medications for this visit.   Facility-Administered Medications Ordered in Other Visits  Medication Dose Route Frequency Provider Last Rate Last Admin  . 0.9 %  sodium chloride infusion  1,000 mL Intravenous Once [name], MD      . arsenic trioxide (TRISENOX) 13.3 mg in sodium chloride 0.9 % 250 mL chemo Infusion  0.15 mg/kg/day (Treatment Plan Recorded) Intravenous Daily [name], MD   Stopped at 04/21/19 1404  . arsenic trioxide (TRISENOX) 13.3 mg in sodium chloride 0.9 % 250 mL chemo Infusion  0.15 mg/kg/day (Treatment Plan Recorded) Intravenous Daily [name], MD 132 mL/hr at 05/05/19 1145 13.3 mg at 05/05/19 1145  . heparin lock flush 100 unit/mL  500 Units Intracatheter Once PRN [name], MD        OBJECTIVE: Vitals:   05/05/19 1019  BP: (!) 122/93  Pulse: 76  Resp: 20  Temp: 98 F (36.7 C)  SpO2: 100%     Body mass index is 30.57 kg/m.    ECOG FS:1 - Symptomatic but completely ambulatory  General: Thin, no acute distress. Eyes: Pink conjunctiva, anicteric sclera. HEENT: Normocephalic, moist mucous membranes. Lungs: No audible wheezing or coughing. Heart: Regular rate and rhythm. Abdomen: Soft, nontender, no obvious distention. Musculoskeletal: No edema, cyanosis, or clubbing. Neuro: Alert, answering all questions appropriately. Cranial nerves grossly intact. Skin: No rashes or petechiae noted. Psych: Normal affect.  LAB RESULTS:  Lab Results  Component Value Date   NA 136 05/05/2019   K 4.5 05/05/2019   CL 105 05/05/2019   CO2 23 05/05/2019   GLUCOSE 86 05/05/2019   BUN 34 (H) 05/05/2019   CREATININE 1.55 (H) 05/05/2019  CALCIUM 8.7 (L) 05/05/2019   PROT 5.7 (L)  05/05/2019   ALBUMIN 3.3 (L) 05/05/2019   AST 27 05/05/2019   ALT 26 05/05/2019   ALKPHOS 53 05/05/2019   BILITOT 0.8 05/05/2019   GFRNONAA 42 (L) 05/05/2019   GFRAA 49 (L) 05/05/2019    Lab Results  Component Value Date   WBC 2.5 (L) 05/05/2019   NEUTROABS 2.0 05/05/2019   HGB 11.6 (L) 05/05/2019   HCT 36.7 (L) 05/05/2019   MCV 105.2 (H) 05/05/2019   PLT 185 05/05/2019     STUDIES: CUP PACEART INCLINIC DEVICE CHECK  Result Date: 04/17/2019 Pacemaker check in clinic. Normal device function. Thresholds, sensing, impedances consistent with previous measurements. Device programmed to maximize longevity. No mode switch or high ventricular rates noted. Device programmed at appropriate safety margins. Histogram distribution appropriate for patient activity level. Device programmed to optimize intrinsic conduction. Estimated longevity 5 yrs 10 months. Patient enrolled in remote follow-up 07/01/19. Follow up with [name] 07/08/19 in [place]. Patient education completed. Pacemaker check in clinic. Normal device function. Thresholds, sensing, impedances consistent with previous measurements. Device programmed to maximize longevity. No mode switch or high ventricular rates noted. Device programmed at appropriate safety margins. Histogram distribution appropriate for patient activity level. Device programmed to optimize intrinsic conduction. Estimated longevity 5 yrs 10 months. Patient enrolled in remote follow-up 07/01/19. Follow up with [name] 07/08/19 in [place]. Patient education completed.   ASSESSMENT: Acute promyelocytic leukemia in remission  PLAN:    1. Acute promyelocytic leukemia in remission: Patient completed his induction chemotherapy at [facility] in approximately November 2020.  Peripheral blood FISH-PML t(15:17) noted complete molecular remission.  Continue with maintenance chemotherapy using ATRA 14 days on 14 days off along with arsenic trioxide at 0.15 mg/kg/day Monday  through Friday for 4 weeks then 4 weeks off.  Each cycle will be a total of 8 weeks.  Pending patient's toleration of treatment, will plan to do a total of 4 cycles or 32 weeks of treatment.  Proceed with cycle 1, day 15 of arsenic today.  Return to clinic Tuesday through Friday for arsenic only.  Patient was then return to clinic in 1 week for further evaluation, laboratory work, and day 1 of ATRA only for 14 days. 2.  Nausea: Patient does not complain of this today.  Continue ondansetron as needed. 3.  Rash: Continue topical treatments as directed.  Can also consider prednisone taper if necessary. 4.  Cardiac disease: Given patient's biventricular pacing, he has a significantly abnormal EKG with a prolonged QTC of approximately 560.  Although treatment should be discontinued with a QTC greater than 500, this is patient's baseline and will continue to monitor with weekly EKGs.  Continue close follow-up with cardiology as scheduled.  EKG from today appears to be at patient's baseline. 5.  Melanoma: No obvious evidence of recurrence.  Monitor. 6.  Prostate cancer: Patient's most recent PSA in June 2020 was reported at 4.35. 7.  Biventricular pacer: Recently evaluated.  Patient has underlying hematoma with surrounding ecchymoses.  Proceed with treatment as above. 8.  Weakness and fatigue: Likely multifactorial.  Continue 1 L of fluids on Mondays and Thursdays of each treatment. 9.  Renal insufficiency: Patient's creatinine is slightly worse today at 1.55.  Monitor.  IV fluids as above. 10.  Leukopenia: Chronic and unchanged.  Proceed with treatment as planned. 11.  Visual disturbance/confusion: Patient cannot get MRI given his biventricular pacer, therefore CT of head was ordered.  Patient expressed  understanding and was in agreement with this plan. He also understands that He can call clinic at any time with any questions, concerns, or complaints.   Cancer Staging Malignant melanoma [facility] Staging  form: Melanoma of the Skin, AJCC 7th Edition - Clinical stage from 09/21/2015: Stage IV ([place], [place], [place]) - Signed by [name], MD on 10/09/2015   [name], MD   05/05/2019 12:59 PM

## 2019-05-04 ENCOUNTER — Ambulatory Visit: Admission: RE | Admit: 2019-05-04 | Payer: Medicare Other | Source: Ambulatory Visit

## 2019-05-05 ENCOUNTER — Inpatient Hospital Stay: Payer: Medicare Other

## 2019-05-05 ENCOUNTER — Other Ambulatory Visit: Payer: Self-pay

## 2019-05-05 ENCOUNTER — Inpatient Hospital Stay: Payer: Medicare Other | Admitting: *Deleted

## 2019-05-05 ENCOUNTER — Inpatient Hospital Stay (HOSPITAL_BASED_OUTPATIENT_CLINIC_OR_DEPARTMENT_OTHER): Payer: Medicare Other | Admitting: Oncology

## 2019-05-05 ENCOUNTER — Encounter: Payer: Self-pay | Admitting: Oncology

## 2019-05-05 VITALS — BP 122/93 | HR 76 | Temp 98.0°F | Resp 20 | Wt 210.0 lb

## 2019-05-05 DIAGNOSIS — C9241 Acute promyelocytic leukemia, in remission: Secondary | ICD-10-CM

## 2019-05-05 DIAGNOSIS — Z01810 Encounter for preprocedural cardiovascular examination: Secondary | ICD-10-CM | POA: Diagnosis not present

## 2019-05-05 DIAGNOSIS — Z95828 Presence of other vascular implants and grafts: Secondary | ICD-10-CM

## 2019-05-05 DIAGNOSIS — Z5111 Encounter for antineoplastic chemotherapy: Secondary | ICD-10-CM | POA: Diagnosis not present

## 2019-05-05 LAB — CBC WITH DIFFERENTIAL/PLATELET
Abs Immature Granulocytes: 0.03 10*3/uL (ref 0.00–0.07)
Basophils Absolute: 0 10*3/uL (ref 0.0–0.1)
Basophils Relative: 0 %
Eosinophils Absolute: 0.1 10*3/uL (ref 0.0–0.5)
Eosinophils Relative: 5 %
HCT: 36.7 % — ABNORMAL LOW (ref 39.0–52.0)
Hemoglobin: 11.6 g/dL — ABNORMAL LOW (ref 13.0–17.0)
Immature Granulocytes: 1 %
Lymphocytes Relative: 7 %
Lymphs Abs: 0.2 10*3/uL — ABNORMAL LOW (ref 0.7–4.0)
MCH: 33.2 pg (ref 26.0–34.0)
MCHC: 31.6 g/dL (ref 30.0–36.0)
MCV: 105.2 fL — ABNORMAL HIGH (ref 80.0–100.0)
Monocytes Absolute: 0.1 10*3/uL (ref 0.1–1.0)
Monocytes Relative: 5 %
Neutro Abs: 2 10*3/uL (ref 1.7–7.7)
Neutrophils Relative %: 82 %
Platelets: 185 10*3/uL (ref 150–400)
RBC: 3.49 MIL/uL — ABNORMAL LOW (ref 4.22–5.81)
RDW: 18.5 % — ABNORMAL HIGH (ref 11.5–15.5)
WBC: 2.5 10*3/uL — ABNORMAL LOW (ref 4.0–10.5)
nRBC: 1.2 % — ABNORMAL HIGH (ref 0.0–0.2)

## 2019-05-05 LAB — COMPREHENSIVE METABOLIC PANEL
ALT: 26 U/L (ref 0–44)
AST: 27 U/L (ref 15–41)
Albumin: 3.3 g/dL — ABNORMAL LOW (ref 3.5–5.0)
Alkaline Phosphatase: 53 U/L (ref 38–126)
Anion gap: 8 (ref 5–15)
BUN: 34 mg/dL — ABNORMAL HIGH (ref 8–23)
CO2: 23 mmol/L (ref 22–32)
Calcium: 8.7 mg/dL — ABNORMAL LOW (ref 8.9–10.3)
Chloride: 105 mmol/L (ref 98–111)
Creatinine, Ser: 1.55 mg/dL — ABNORMAL HIGH (ref 0.61–1.24)
GFR calc Af Amer: 49 mL/min — ABNORMAL LOW (ref >60)
GFR calc non Af Amer: 42 mL/min — ABNORMAL LOW (ref >60)
Glucose, Bld: 86 mg/dL (ref 70–99)
Potassium: 4.5 mmol/L (ref 3.5–5.1)
Sodium: 136 mmol/L (ref 135–145)
Total Bilirubin: 0.8 mg/dL (ref 0.3–1.2)
Total Protein: 5.7 g/dL — ABNORMAL LOW (ref 6.5–8.1)

## 2019-05-05 LAB — MAGNESIUM: Magnesium: 2 mg/dL (ref 1.7–2.4)

## 2019-05-05 MED ORDER — SODIUM CHLORIDE 0.9 % IV SOLN
0.1500 mg/kg/d | Freq: Every day | INTRAVENOUS | Status: DC
  Administered 2019-05-05: 12:00:00 13.3 mg via INTRAVENOUS
  Filled 2019-05-05: qty 13.3

## 2019-05-05 MED ORDER — SODIUM CHLORIDE 0.9 % IV SOLN
10.0000 mg | Freq: Once | INTRAVENOUS | Status: AC
  Administered 2019-05-05: 10 mg via INTRAVENOUS
  Filled 2019-05-05: qty 1

## 2019-05-05 MED ORDER — PALONOSETRON HCL INJECTION 0.25 MG/5ML
0.2500 mg | Freq: Once | INTRAVENOUS | Status: AC
  Administered 2019-05-05: 0.25 mg via INTRAVENOUS
  Filled 2019-05-05: qty 5

## 2019-05-05 MED ORDER — HEPARIN SOD (PORK) LOCK FLUSH 100 UNIT/ML IV SOLN
500.0000 [IU] | Freq: Once | INTRAVENOUS | Status: AC | PRN
  Administered 2019-05-05: 500 [IU]
  Filled 2019-05-05: qty 5

## 2019-05-05 MED ORDER — SODIUM CHLORIDE 0.9% FLUSH
10.0000 mL | Freq: Once | INTRAVENOUS | Status: AC
  Administered 2019-05-05: 10 mL via INTRAVENOUS
  Filled 2019-05-05: qty 10

## 2019-05-05 MED ORDER — SODIUM CHLORIDE 0.9 % IV SOLN
Freq: Once | INTRAVENOUS | Status: AC
  Filled 2019-05-05: qty 250

## 2019-05-05 NOTE — Progress Notes (Signed)
Per Dr. Grayland Ormond okay to proceed with treatment with WBC 2.5, Creatinine 1.55 and QTc 527.   Pt tolerated infusion well., pt denies any concerns at this time. No s/s of distress noted. Pt stable at discharge.

## 2019-05-06 ENCOUNTER — Inpatient Hospital Stay: Payer: Medicare Other

## 2019-05-06 ENCOUNTER — Other Ambulatory Visit: Payer: Self-pay

## 2019-05-06 VITALS — BP 131/81 | HR 76 | Temp 94.9°F | Resp 20

## 2019-05-06 DIAGNOSIS — D485 Neoplasm of uncertain behavior of skin: Secondary | ICD-10-CM | POA: Diagnosis not present

## 2019-05-06 DIAGNOSIS — D1801 Hemangioma of skin and subcutaneous tissue: Secondary | ICD-10-CM | POA: Diagnosis not present

## 2019-05-06 DIAGNOSIS — Z85820 Personal history of malignant melanoma of skin: Secondary | ICD-10-CM | POA: Diagnosis not present

## 2019-05-06 DIAGNOSIS — D2262 Melanocytic nevi of left upper limb, including shoulder: Secondary | ICD-10-CM | POA: Diagnosis not present

## 2019-05-06 DIAGNOSIS — Z85828 Personal history of other malignant neoplasm of skin: Secondary | ICD-10-CM | POA: Diagnosis not present

## 2019-05-06 DIAGNOSIS — C9241 Acute promyelocytic leukemia, in remission: Secondary | ICD-10-CM

## 2019-05-06 DIAGNOSIS — C4359 Malignant melanoma of other part of trunk: Secondary | ICD-10-CM | POA: Diagnosis not present

## 2019-05-06 DIAGNOSIS — L570 Actinic keratosis: Secondary | ICD-10-CM | POA: Diagnosis not present

## 2019-05-06 DIAGNOSIS — L821 Other seborrheic keratosis: Secondary | ICD-10-CM | POA: Diagnosis not present

## 2019-05-06 DIAGNOSIS — D692 Other nonthrombocytopenic purpura: Secondary | ICD-10-CM | POA: Diagnosis not present

## 2019-05-06 DIAGNOSIS — C44629 Squamous cell carcinoma of skin of left upper limb, including shoulder: Secondary | ICD-10-CM | POA: Diagnosis not present

## 2019-05-06 DIAGNOSIS — Z5111 Encounter for antineoplastic chemotherapy: Secondary | ICD-10-CM | POA: Diagnosis not present

## 2019-05-06 MED ORDER — SODIUM CHLORIDE 0.9 % IV SOLN
Freq: Once | INTRAVENOUS | Status: AC
  Filled 2019-05-06: qty 250

## 2019-05-06 MED ORDER — SODIUM CHLORIDE 0.9 % IV SOLN
0.1500 mg/kg/d | Freq: Every day | INTRAVENOUS | Status: DC
  Administered 2019-05-06: 14:00:00 13.3 mg via INTRAVENOUS
  Filled 2019-05-06: qty 13.3

## 2019-05-06 MED ORDER — SODIUM CHLORIDE 0.9 % IV SOLN
10.0000 mg | Freq: Once | INTRAVENOUS | Status: AC
  Administered 2019-05-06: 13:00:00 10 mg via INTRAVENOUS
  Filled 2019-05-06: qty 1

## 2019-05-06 MED ORDER — HEPARIN SOD (PORK) LOCK FLUSH 100 UNIT/ML IV SOLN
500.0000 [IU] | Freq: Once | INTRAVENOUS | Status: AC | PRN
  Administered 2019-05-06: 500 [IU]
  Filled 2019-05-06: qty 5

## 2019-05-06 MED ORDER — SODIUM CHLORIDE 0.9% FLUSH
10.0000 mL | INTRAVENOUS | Status: DC | PRN
  Administered 2019-05-06: 13:00:00 10 mL
  Filled 2019-05-06: qty 10

## 2019-05-07 ENCOUNTER — Other Ambulatory Visit: Payer: Self-pay

## 2019-05-07 ENCOUNTER — Inpatient Hospital Stay: Payer: Medicare Other

## 2019-05-07 ENCOUNTER — Other Ambulatory Visit: Payer: Self-pay | Admitting: Radiation Therapy

## 2019-05-07 VITALS — BP 118/79 | HR 75 | Temp 96.6°F | Resp 20

## 2019-05-07 DIAGNOSIS — Z5111 Encounter for antineoplastic chemotherapy: Secondary | ICD-10-CM | POA: Diagnosis not present

## 2019-05-07 DIAGNOSIS — C9241 Acute promyelocytic leukemia, in remission: Secondary | ICD-10-CM

## 2019-05-07 MED ORDER — SODIUM CHLORIDE 0.9 % IV SOLN
10.0000 mg | Freq: Once | INTRAVENOUS | Status: AC
  Administered 2019-05-07: 13:00:00 10 mg via INTRAVENOUS
  Filled 2019-05-07: qty 10

## 2019-05-07 MED ORDER — SODIUM CHLORIDE 0.9 % IV SOLN
Freq: Once | INTRAVENOUS | Status: AC
  Filled 2019-05-07: qty 250

## 2019-05-07 MED ORDER — SODIUM CHLORIDE 0.9 % IV SOLN
0.1500 mg/kg/d | Freq: Every day | INTRAVENOUS | Status: DC
  Administered 2019-05-07: 14:00:00 13.3 mg via INTRAVENOUS
  Filled 2019-05-07: qty 13.3

## 2019-05-07 MED ORDER — PALONOSETRON HCL INJECTION 0.25 MG/5ML
0.2500 mg | Freq: Once | INTRAVENOUS | Status: AC
  Administered 2019-05-07: 0.25 mg via INTRAVENOUS
  Filled 2019-05-07: qty 5

## 2019-05-07 MED ORDER — HEPARIN SOD (PORK) LOCK FLUSH 100 UNIT/ML IV SOLN
500.0000 [IU] | Freq: Once | INTRAVENOUS | Status: AC | PRN
  Administered 2019-05-07: 16:00:00 500 [IU]
  Filled 2019-05-07: qty 5

## 2019-05-07 NOTE — Progress Notes (Signed)
Pt tolerated infusion well. Pt stable at discharge. 

## 2019-05-08 ENCOUNTER — Inpatient Hospital Stay: Payer: Medicare Other

## 2019-05-08 ENCOUNTER — Other Ambulatory Visit: Payer: Self-pay

## 2019-05-08 VITALS — BP 128/84 | HR 75 | Temp 97.0°F | Resp 19

## 2019-05-08 DIAGNOSIS — C9241 Acute promyelocytic leukemia, in remission: Secondary | ICD-10-CM | POA: Diagnosis not present

## 2019-05-08 DIAGNOSIS — Z5111 Encounter for antineoplastic chemotherapy: Secondary | ICD-10-CM | POA: Diagnosis not present

## 2019-05-08 MED ORDER — SODIUM CHLORIDE 0.9 % IV SOLN
10.0000 mg | Freq: Once | INTRAVENOUS | Status: AC
  Administered 2019-05-08: 13:00:00 10 mg via INTRAVENOUS
  Filled 2019-05-08: qty 10

## 2019-05-08 MED ORDER — SODIUM CHLORIDE 0.9 % IV SOLN
0.1500 mg/kg/d | Freq: Every day | INTRAVENOUS | Status: DC
  Administered 2019-05-08: 13.3 mg via INTRAVENOUS
  Filled 2019-05-08: qty 13.3

## 2019-05-08 MED ORDER — HEPARIN SOD (PORK) LOCK FLUSH 100 UNIT/ML IV SOLN
INTRAVENOUS | Status: AC
  Filled 2019-05-08: qty 5

## 2019-05-08 MED ORDER — SODIUM CHLORIDE 0.9 % IV SOLN
Freq: Once | INTRAVENOUS | Status: AC
  Filled 2019-05-08: qty 250

## 2019-05-08 MED ORDER — HEPARIN SOD (PORK) LOCK FLUSH 100 UNIT/ML IV SOLN
500.0000 [IU] | Freq: Once | INTRAVENOUS | Status: AC | PRN
  Administered 2019-05-08: 16:00:00 500 [IU]
  Filled 2019-05-08: qty 5

## 2019-05-09 ENCOUNTER — Other Ambulatory Visit: Payer: Self-pay

## 2019-05-09 ENCOUNTER — Inpatient Hospital Stay: Payer: Medicare Other

## 2019-05-09 VITALS — BP 126/78 | HR 76 | Temp 96.8°F | Resp 20

## 2019-05-09 DIAGNOSIS — C9241 Acute promyelocytic leukemia, in remission: Secondary | ICD-10-CM | POA: Diagnosis not present

## 2019-05-09 DIAGNOSIS — Z5111 Encounter for antineoplastic chemotherapy: Secondary | ICD-10-CM | POA: Diagnosis not present

## 2019-05-09 MED ORDER — PALONOSETRON HCL INJECTION 0.25 MG/5ML
0.2500 mg | Freq: Once | INTRAVENOUS | Status: AC
  Administered 2019-05-09: 13:00:00 0.25 mg via INTRAVENOUS
  Filled 2019-05-09: qty 5

## 2019-05-09 MED ORDER — HEPARIN SOD (PORK) LOCK FLUSH 100 UNIT/ML IV SOLN
500.0000 [IU] | Freq: Once | INTRAVENOUS | Status: AC | PRN
  Administered 2019-05-09: 16:00:00 500 [IU]
  Filled 2019-05-09: qty 5

## 2019-05-09 MED ORDER — SODIUM CHLORIDE 0.9% FLUSH
10.0000 mL | INTRAVENOUS | Status: DC | PRN
  Administered 2019-05-09: 10 mL
  Filled 2019-05-09: qty 10

## 2019-05-09 MED ORDER — SODIUM CHLORIDE 0.9 % IV SOLN
0.1500 mg/kg/d | Freq: Every day | INTRAVENOUS | Status: DC
  Administered 2019-05-09: 14:00:00 13.3 mg via INTRAVENOUS
  Filled 2019-05-09: qty 13.3

## 2019-05-09 MED ORDER — SODIUM CHLORIDE 0.9 % IV SOLN
Freq: Once | INTRAVENOUS | Status: AC
  Filled 2019-05-09: qty 250

## 2019-05-09 MED ORDER — HEPARIN SOD (PORK) LOCK FLUSH 100 UNIT/ML IV SOLN
INTRAVENOUS | Status: AC
  Filled 2019-05-09: qty 5

## 2019-05-09 MED ORDER — SODIUM CHLORIDE 0.9 % IV SOLN
10.0000 mg | Freq: Once | INTRAVENOUS | Status: AC
  Administered 2019-05-09: 13:00:00 10 mg via INTRAVENOUS
  Filled 2019-05-09: qty 10

## 2019-05-09 NOTE — Progress Notes (Signed)
Patient is coming in for follow up he mentions last Monday he was not feeling well but Tuesday - Friday feeling better.

## 2019-05-09 NOTE — Progress Notes (Signed)
David [name]  [phone] [phone]  ID: David [name]. OB: 07-22-1940  MR#: [id number]  NOB#:[id number]  Patient Care Team: David Bush, MD as PCP - General (Family [name]) David Rue, RN Nurse Navigator as Registered Nurse (Medical Oncology) David Chain, DO as Consulting Physician David [name]) David Huger, MD as Consulting Physician (Hematology and Oncology)  CHIEF COMPLAINT: Acute promyelocytic leukemia in remission  INTERVAL HISTORY: Patient returns to clinic today for further evaluation.  He continues to have significant weakness and fatigue, but otherwise feels well.  He completed his first cycle of 4 weeks of arsenic last week.  Plan is to initiate his next 2-week course of  ATRA today.  He has no neurologic complaints.  He denies any recent fevers or illnesses.  He has a good appetite and denies weight loss.  He denies any chest pain, shortness of breath, cough, or hemoptysis.  He denies any nausea, vomiting, constipation, or diarrhea.  He has no urinary complaints.  Patient offers no further specific complaints today.  REVIEW OF SYSTEMS:   Review of Systems  Constitutional: Positive for malaise/fatigue. Negative for fever and weight loss.  Respiratory: Negative.  Negative for cough, hemoptysis and shortness of breath.   Cardiovascular: Negative.  Negative for chest pain and leg swelling.  Gastrointestinal: Negative.  Negative for abdominal pain and nausea.  Genitourinary: Negative.  Negative for hematuria.  Musculoskeletal: Negative.  Negative for back pain.  Skin: Negative.  Negative for rash.  Neurological: Positive for weakness. Negative for dizziness, focal weakness and headaches.  Psychiatric/Behavioral: Negative.  The patient is not nervous/anxious.     As per HPI. Otherwise, a complete review of systems is negative.  PAST MEDICAL HISTORY: Past Medical History:  Diagnosis Date    . Anginal pain ([place])   . Arthritis    mostly hands  . Benign familial hematuria   . BPH (benign prostatic hypertrophy)   . Brain cancer ([place])    melanoma with brain met  . Coronary artery disease   . GERD (gastroesophageal reflux disease)   . High cholesterol   . Hypertension   . Hypothyroidism   . Intracerebral hemorrhage ([place])   . Melanoma ([place])   . Metastatic melanoma to lung ([place])   . Mood disorder (David [name])   . Myocardial infarction ([place])   . Pacemaker    due to syncope, 3rd degree HB (upgrade to [facility]. Jude CRT-P 02/25/13 (Dr. Uvaldo [name])  . Rectal bleed    due to NSAIDS  . Renal disorder   . Sepsis ([place]) 12/09/2018  . Skin cancer    melanoma  . Sleep apnea    uses CPAP  . Stroke [facility])     PAST SURGICAL HISTORY: Past Surgical History:  Procedure Laterality Date  . APPLICATION OF CRANIAL NAVIGATION N/A 10/15/2015   Procedure: APPLICATION OF CRANIAL NAVIGATION;  Surgeon: David Levine, MD;  Location: [place] [facility];  Service: Neurosurgery;  Laterality: N/A;  . BIV PACEMAKER GENERATOR CHANGEOUT N/A 04/02/2019   Procedure: BIV PACEMAKER GENERATOR CHANGEOUT;  Surgeon: David Lance, MD;  Location: [place] CV LAB;  Service: Cardiovascular;  Laterality: N/A;  . CARDIAC CATHETERIZATION  2013  . CARDIAC SURGERY     bypass X 2  . COLONOSCOPY WITH PROPOFOL N/A 05/08/2016   diverticulosis, int hem, no f/u needed David [name])  . CORONARY ARTERY BYPASS GRAFT  2013   LIMA-LAD, SVG-PDA 03/27/11 (Dr. Francee [name])  . CRANIOTOMY N/A 10/15/2015   Procedure: LEFT FRONTAL  CRANIOTOMY TUMOR EXCISION with Curve;  Surgeon: David Levine, MD;  Location: [place] [facility];  Service: Neurosurgery;  Laterality: N/A;  CRANIOTOMY TUMOR EXCISION  . EYE SURGERY    . FOOT SURGERY  08/2010  . JOINT REPLACEMENT Left    partial knee  . KNEE ARTHROSCOPY  12/2008  . LYMPH NODE BIOPSY    . PACEMAKER INSERTION    . TONSILLECTOMY      FAMILY HISTORY: Family History  Problem Relation Age of Onset  . Cancer Mother 23        lung   . Hypertension Mother   . Hypertension Father   . Heart attack Father   . Heart disease Father   . Rheum arthritis Sister   . Cancer Maternal Grandmother     ADVANCED DIRECTIVES (Y/N):  N  HEALTH MAINTENANCE: Social History   Tobacco Use  . Smoking status: Former Smoker    Packs/day: 1.00    Years: 3.00    Pack years: 3.00    Quit date: 04/10/1957    Years since quitting: 62.1  . Smokeless tobacco: Never Used  Substance Use Topics  . Alcohol use: Yes    Alcohol/week: 14.0 standard drinks    Types: 10 Glasses of wine, 4 Cans of beer per week  . Drug use: No     Colonoscopy:  PAP:  Bone density:  Lipid panel:  Allergies  Allergen Reactions  . Ciprofloxacin Other (See Comments)    Tendonitis  . Ezetimibe Other (See Comments)    Unknown reaction  . Simvastatin Other (See Comments)    Reaction:  Gave pt a fever  Fever - temp of 103.   In 2003    Current Outpatient Medications  Medication Sig Dispense Refill  . acetaminophen (TYLENOL) 325 MG tablet Take 2 tablets (650 mg total) by mouth every 6 (six) hours as needed for mild pain (or Fever >/= 101).    Marland Kitchen acyclovir (ZOVIRAX) 400 MG tablet Take 400 mg by mouth 2 (two) times daily.    Marland Kitchen allopurinol (ZYLOPRIM) 100 MG tablet Take 2 tablets (200 mg total) by mouth daily. 180 tablet 1  . aspirin EC 81 MG tablet Take 81 mg by mouth every evening.     . Blood Glucose Monitoring Suppl (ONETOUCH VERIO) w/Device KIT 1 kit by Does not apply route as directed. E11.65 1 kit 0  . carboxymethylcellulose (REFRESH TEARS) 0.5 % SOLN Apply to eye.    . Cholecalciferol (VITAMIN D3) 25 MCG (1000 UT) CAPS Take 1 capsule (1,000 Units total) by mouth daily. 30 capsule 0  . citalopram (CELEXA) 10 MG tablet Take 1 tablet (10 mg total) by mouth daily. 90 tablet 1  . colchicine 0.6 MG tablet Take 1 tablet (0.6 mg total) by mouth daily as needed (gout flare). Take 2 tablets on the first day of flare 30 tablet 0  . glucose blood test strip  E11.65 Use as instructed to check sugars three time daily and as needed 100 each 12  . HUMALOG KWIKPEN 100 UNIT/ML KwikPen     . hydrocortisone cream 1 % Apply topically.    . insulin aspart (NOVOLOG FLEXPEN) 100 UNIT/ML FlexPen Use three times daily with meals as needed per sliding scale: 2 units for every 50 units over 150 (2u for 150-199, 4u for 200-249, 6u for 250-299, etc) 3 mL 1  . ketoconazole (NIZORAL) 2 % shampoo Apply topically.    . Lancets (ONETOUCH ULTRASOFT) lancets E11.65 Use as instructed to check sugars  three time daily and as needed 100 each 12  . levothyroxine (SYNTHROID) 137 MCG tablet TAKE 1 TABLET BY MOUTH ONCE DAILY BEFORE BREAKFAST (Patient taking differently: Take 137 mcg by mouth daily before breakfast. ) 30 tablet 11  . methylphenidate (RITALIN) 5 MG tablet Take 1 tablet (5 mg total) by mouth 2 (two) times daily with breakfast and lunch. 60 tablet 0  . metoprolol succinate (TOPROL-XL) 50 MG 24 hr tablet TAKE 1 AND 1/2 TABLET (75 MG) BY MOUTH EVERY DAY WITH OR IMMEDIATELY FOLLOWING A MEAL *DO NOT CRUSH* (Patient taking differently: Take 50 mg by mouth daily. ) 50 tablet 11  . mineral oil-hydrophilic petrolatum (AQUAPHOR) ointment Apply topically.    . ondansetron (ZOFRAN-ODT) 4 MG disintegrating tablet Take 1 tablet (4 mg total) by mouth every 8 (eight) hours as needed for nausea or vomiting. 60 tablet 2  . polyethylene glycol (MIRALAX / GLYCOLAX) 17 g packet Take 17 g by mouth 2 (two) times daily. (Patient taking differently: Take 17 g by mouth daily. ) 14 each 0  . predniSONE (DELTASONE) 5 MG tablet Take 10 mg by mouth daily.     Marland Kitchen senna (SENOKOT) 8.6 MG TABS tablet Take 1 tablet (8.6 mg total) by mouth at bedtime. 120 tablet 0  . senna-docusate (SENOKOT-S) 8.6-50 MG tablet Take by mouth.    . sodium chloride (ALTAMIST SPRAY) 0.65 % nasal spray Place into the nose.    . testosterone cypionate (DEPOTESTOSTERONE CYPIONATE) 200 MG/ML injection Inject 100 mg into the muscle  every 14 (fourteen) days.    . traZODone (DESYREL) 50 MG tablet Take 0.5-1 tablets (25-50 mg total) by mouth at bedtime. (Patient taking differently: Take 25 mg by mouth at bedtime as needed for sleep. ) 30 tablet 1  . tretinoin (VESANOID) 10 MG capsule     . Turmeric Curcumin 500 MG CAPS Take by mouth.     No current facility-administered medications for this visit.   Facility-Administered Medications Ordered in Other Visits  Medication Dose Route Frequency Provider Last Rate Last Admin  . 0.9 %  sodium chloride infusion  1,000 mL Intravenous Once [name], MD      . arsenic trioxide (TRISENOX) 13.3 mg in sodium chloride 0.9 % 250 mL chemo Infusion  0.15 mg/kg/day (Treatment Plan Recorded) Intravenous Daily David Huger, MD   Stopped at 04/21/19 1404  . arsenic trioxide (TRISENOX) 13.3 mg in sodium chloride 0.9 % 250 mL chemo Infusion  0.15 mg/kg/day (Treatment Plan Recorded) Intravenous Daily David Huger, MD 132 mL/hr at 05/09/19 1340 13.3 mg at 05/09/19 1340  . heparin lock flush 100 unit/mL  500 Units Intravenous Once David Huger, MD      . heparin lock flush 100 unit/mL  500 Units Intracatheter Once PRN David Huger, MD      . sodium chloride flush (NS) 0.9 % injection 10 mL  10 mL Intracatheter PRN David Huger, MD   10 mL at 05/09/19 1318    OBJECTIVE: Vitals:   05/12/19 1105  BP: 108/83  Pulse: 87  Resp: 19  Temp: 97.6 F (36.4 C)  SpO2: 95%     Body mass index is 29.69 kg/m.    ECOG FS:1 - Symptomatic but completely ambulatory  General: Thin, no acute distress. Eyes: Pink conjunctiva, anicteric sclera. HEENT: Normocephalic, moist mucous membranes. Lungs: No audible wheezing or coughing. Heart: Regular rate and rhythm. Abdomen: Soft, nontender, no obvious distention. Musculoskeletal: No edema, cyanosis, or clubbing. Neuro: Alert,  answering all questions appropriately. Cranial nerves grossly intact. Skin: No rashes or petechiae  noted. Psych: Normal affect.  LAB RESULTS:  Lab Results  Component Value Date   NA 138 05/12/2019   K 4.4 05/12/2019   CL 109 05/12/2019   CO2 20 (L) 05/12/2019   GLUCOSE 106 (H) 05/12/2019   BUN 43 (H) 05/12/2019   CREATININE 1.62 (H) 05/12/2019   CALCIUM 9.0 05/12/2019   PROT 6.1 (L) 05/12/2019   ALBUMIN 3.3 (L) 05/12/2019   AST 27 05/12/2019   ALT 25 05/12/2019   ALKPHOS 61 05/12/2019   BILITOT 0.8 05/12/2019   GFRNONAA 40 (L) 05/12/2019   GFRAA 46 (L) 05/12/2019    Lab Results  Component Value Date   WBC 2.6 (L) 05/12/2019   NEUTROABS 2.1 05/12/2019   HGB 12.3 (L) 05/12/2019   HCT 38.5 (L) 05/12/2019   MCV 104.9 (H) 05/12/2019   PLT 252 05/12/2019     STUDIES: CUP PACEART INCLINIC DEVICE CHECK  Result Date: 04/17/2019 Pacemaker check in clinic. Normal device function. Thresholds, sensing, impedances consistent with previous measurements. Device programmed to maximize longevity. No mode switch or high ventricular rates noted. Device programmed at appropriate safety margins. Histogram distribution appropriate for patient activity level. Device programmed to optimize intrinsic conduction. Estimated longevity 5 yrs 10 months. Patient enrolled in remote follow-up 07/01/19. Follow up with [name] 07/08/19 in [place]. Patient education completed. Pacemaker check in clinic. Normal device function. Thresholds, sensing, impedances consistent with previous measurements. Device programmed to maximize longevity. No mode switch or high ventricular rates noted. Device programmed at appropriate safety margins. Histogram distribution appropriate for patient activity level. Device programmed to optimize intrinsic conduction. Estimated longevity 5 yrs 10 months. Patient enrolled in remote follow-up 07/01/19. Follow up with [name] 07/08/19 in [place]. Patient education completed.   ASSESSMENT: Acute promyelocytic leukemia in remission  PLAN:    1. Acute promyelocytic leukemia in  remission: Patient completed his induction chemotherapy at [facility] in approximately November 2020.  Peripheral blood FISH-PML t(15:17) noted complete molecular remission.  Continue with maintenance chemotherapy using ATRA 14 days on 14 days off along with arsenic trioxide at 0.15 mg/kg/day Monday through Friday for 4 weeks then 4 weeks off.  Each cycle will be a total of 8 weeks.  Pending patient's toleration of treatment, will plan to do a total of 4 cycles or 32 weeks of treatment.  Patient's will initiate cycle 2, day 1 of arsenic 4 weeks from today.  Proceed with ATRA only for 14 days.  Return to clinic in 1 week for laboratory work and IV fluids and then in 2 weeks for laboratory work, further evaluation, and additional IV fluids if necessary. 2.  Nausea: Patient does not complain of this today.  Continue ondansetron as needed. 3.  Rash: Continue topical treatments as directed.  Can also consider prednisone taper if necessary. 4.  Cardiac disease: Given patient's biventricular pacing, he has a significantly abnormal EKG with a prolonged QTC of approximately 560.  Although treatment should be discontinued with a QTC greater than 500, this is patient's baseline and will continue to monitor with weekly EKGs.  Continue close follow-up with cardiology as scheduled.  5.  Melanoma: No obvious evidence of recurrence.  Monitor. 6.  Prostate cancer: Patient's most recent PSA in June 2020 was reported at 4.35. 7.  Biventricular pacer: Recently evaluated.  Proceed with treatment as above. 8.  Weakness and fatigue: Likely multifactorial.  Proceed with 1 L of IV fluids  today.   9.  Renal insufficiency: Patient's creatinine has trended up to 1.62.  IV fluids as above. 10.  Leukopenia: Chronic and unchanged.  Proceed with treatment as planned. 11.  Visual disturbance/confusion: Patient cannot get MRI given his biventricular pacer, therefore CT of head was ordered.  Patient expressed understanding and was in  agreement with this plan. He also understands that He can call clinic at any time with any questions, concerns, or complaints.     David Huger, MD   05/12/2019 1:18 PM

## 2019-05-12 ENCOUNTER — Inpatient Hospital Stay: Payer: Medicare Other

## 2019-05-12 ENCOUNTER — Other Ambulatory Visit: Payer: Self-pay

## 2019-05-12 ENCOUNTER — Inpatient Hospital Stay (HOSPITAL_BASED_OUTPATIENT_CLINIC_OR_DEPARTMENT_OTHER): Payer: Medicare Other | Admitting: Oncology

## 2019-05-12 ENCOUNTER — Inpatient Hospital Stay: Payer: Medicare Other | Attending: Oncology

## 2019-05-12 ENCOUNTER — Inpatient Hospital Stay: Payer: Medicare Other | Admitting: Oncology

## 2019-05-12 VITALS — BP 108/83 | HR 87 | Temp 97.6°F | Resp 19 | Wt 204.0 lb

## 2019-05-12 DIAGNOSIS — R5383 Other fatigue: Secondary | ICD-10-CM | POA: Diagnosis not present

## 2019-05-12 DIAGNOSIS — R531 Weakness: Secondary | ICD-10-CM | POA: Diagnosis not present

## 2019-05-12 DIAGNOSIS — C9241 Acute promyelocytic leukemia, in remission: Secondary | ICD-10-CM

## 2019-05-12 DIAGNOSIS — C439 Malignant melanoma of skin, unspecified: Secondary | ICD-10-CM

## 2019-05-12 LAB — COMPREHENSIVE METABOLIC PANEL
ALT: 25 U/L (ref 0–44)
AST: 27 U/L (ref 15–41)
Albumin: 3.3 g/dL — ABNORMAL LOW (ref 3.5–5.0)
Alkaline Phosphatase: 61 U/L (ref 38–126)
Anion gap: 9 (ref 5–15)
BUN: 43 mg/dL — ABNORMAL HIGH (ref 8–23)
CO2: 20 mmol/L — ABNORMAL LOW (ref 22–32)
Calcium: 9 mg/dL (ref 8.9–10.3)
Chloride: 109 mmol/L (ref 98–111)
Creatinine, Ser: 1.62 mg/dL — ABNORMAL HIGH (ref 0.61–1.24)
GFR calc Af Amer: 46 mL/min — ABNORMAL LOW (ref >60)
GFR calc non Af Amer: 40 mL/min — ABNORMAL LOW (ref >60)
Glucose, Bld: 106 mg/dL — ABNORMAL HIGH (ref 70–99)
Potassium: 4.4 mmol/L (ref 3.5–5.1)
Sodium: 138 mmol/L (ref 135–145)
Total Bilirubin: 0.8 mg/dL (ref 0.3–1.2)
Total Protein: 6.1 g/dL — ABNORMAL LOW (ref 6.5–8.1)

## 2019-05-12 LAB — CBC WITH DIFFERENTIAL/PLATELET
Abs Immature Granulocytes: 0.04 10*3/uL (ref 0.00–0.07)
Basophils Absolute: 0 10*3/uL (ref 0.0–0.1)
Basophils Relative: 0 %
Eosinophils Absolute: 0.1 10*3/uL (ref 0.0–0.5)
Eosinophils Relative: 3 %
HCT: 38.5 % — ABNORMAL LOW (ref 39.0–52.0)
Hemoglobin: 12.3 g/dL — ABNORMAL LOW (ref 13.0–17.0)
Immature Granulocytes: 2 %
Lymphocytes Relative: 7 %
Lymphs Abs: 0.2 10*3/uL — ABNORMAL LOW (ref 0.7–4.0)
MCH: 33.5 pg (ref 26.0–34.0)
MCHC: 31.9 g/dL (ref 30.0–36.0)
MCV: 104.9 fL — ABNORMAL HIGH (ref 80.0–100.0)
Monocytes Absolute: 0.1 10*3/uL (ref 0.1–1.0)
Monocytes Relative: 5 %
Neutro Abs: 2.1 10*3/uL (ref 1.7–7.7)
Neutrophils Relative %: 83 %
Platelets: 252 10*3/uL (ref 150–400)
RBC: 3.67 MIL/uL — ABNORMAL LOW (ref 4.22–5.81)
RDW: 18.8 % — ABNORMAL HIGH (ref 11.5–15.5)
WBC: 2.6 10*3/uL — ABNORMAL LOW (ref 4.0–10.5)
nRBC: 2 % — ABNORMAL HIGH (ref 0.0–0.2)

## 2019-05-12 MED ORDER — SODIUM CHLORIDE 0.9 % IV SOLN
Freq: Once | INTRAVENOUS | Status: AC
  Filled 2019-05-12: qty 250

## 2019-05-12 MED ORDER — HEPARIN SOD (PORK) LOCK FLUSH 100 UNIT/ML IV SOLN
500.0000 [IU] | Freq: Once | INTRAVENOUS | Status: AC
  Administered 2019-05-12: 500 [IU] via INTRAVENOUS
  Filled 2019-05-12: qty 5

## 2019-05-12 MED ORDER — SODIUM CHLORIDE 0.9% FLUSH
10.0000 mL | Freq: Once | INTRAVENOUS | Status: AC
  Administered 2019-05-12: 10 mL via INTRAVENOUS
  Filled 2019-05-12: qty 10

## 2019-05-12 MED ORDER — HEPARIN SOD (PORK) LOCK FLUSH 100 UNIT/ML IV SOLN
INTRAVENOUS | Status: AC
  Filled 2019-05-12: qty 5

## 2019-05-12 MED ORDER — HEPARIN SOD (PORK) LOCK FLUSH 100 UNIT/ML IV SOLN
500.0000 [IU] | Freq: Once | INTRAVENOUS | Status: DC | PRN
  Filled 2019-05-12: qty 5

## 2019-05-12 NOTE — Progress Notes (Signed)
Patient reports feeling "down" and "under the weather" today. Is O2 sat ranging from 93-95%

## 2019-05-14 ENCOUNTER — Other Ambulatory Visit: Payer: Self-pay

## 2019-05-14 ENCOUNTER — Ambulatory Visit
Admission: RE | Admit: 2019-05-14 | Discharge: 2019-05-14 | Disposition: A | Discharge status: Home / Self Care | Payer: Medicare Other | Source: Ambulatory Visit | Attending: Oncology | Admitting: Oncology

## 2019-05-14 DIAGNOSIS — C9241 Acute promyelocytic leukemia, in remission: Secondary | ICD-10-CM | POA: Insufficient documentation

## 2019-05-14 DIAGNOSIS — C7931 Secondary malignant neoplasm of brain: Secondary | ICD-10-CM | POA: Diagnosis not present

## 2019-05-14 MED ORDER — IOHEXOL 300 MG/ML  SOLN
75.0000 mL | Freq: Once | INTRAMUSCULAR | Status: AC | PRN
  Administered 2019-05-14: 75 mL via INTRAVENOUS

## 2019-05-19 ENCOUNTER — Other Ambulatory Visit: Payer: Self-pay

## 2019-05-19 ENCOUNTER — Inpatient Hospital Stay: Payer: Medicare Other

## 2019-05-19 ENCOUNTER — Inpatient Hospital Stay: Payer: Medicare Other | Attending: Oncology | Admitting: *Deleted

## 2019-05-19 VITALS — BP 115/72 | HR 75 | Temp 97.0°F | Resp 18

## 2019-05-19 DIAGNOSIS — R531 Weakness: Secondary | ICD-10-CM | POA: Insufficient documentation

## 2019-05-19 DIAGNOSIS — Z95828 Presence of other vascular implants and grafts: Secondary | ICD-10-CM

## 2019-05-19 DIAGNOSIS — C9241 Acute promyelocytic leukemia, in remission: Secondary | ICD-10-CM | POA: Insufficient documentation

## 2019-05-19 DIAGNOSIS — R5383 Other fatigue: Secondary | ICD-10-CM | POA: Diagnosis not present

## 2019-05-19 LAB — CBC WITH DIFFERENTIAL/PLATELET
Abs Immature Granulocytes: 0.03 10*3/uL (ref 0.00–0.07)
Basophils Absolute: 0 10*3/uL (ref 0.0–0.1)
Basophils Relative: 0 %
Eosinophils Absolute: 0.1 10*3/uL (ref 0.0–0.5)
Eosinophils Relative: 2 %
HCT: 38.2 % — ABNORMAL LOW (ref 39.0–52.0)
Hemoglobin: 12.3 g/dL — ABNORMAL LOW (ref 13.0–17.0)
Immature Granulocytes: 1 %
Lymphocytes Relative: 10 %
Lymphs Abs: 0.3 10*3/uL — ABNORMAL LOW (ref 0.7–4.0)
MCH: 33.6 pg (ref 26.0–34.0)
MCHC: 32.2 g/dL (ref 30.0–36.0)
MCV: 104.4 fL — ABNORMAL HIGH (ref 80.0–100.0)
Monocytes Absolute: 0.3 10*3/uL (ref 0.1–1.0)
Monocytes Relative: 8 %
Neutro Abs: 2.6 10*3/uL (ref 1.7–7.7)
Neutrophils Relative %: 79 %
Platelets: 200 10*3/uL (ref 150–400)
RBC: 3.66 MIL/uL — ABNORMAL LOW (ref 4.22–5.81)
RDW: 17.2 % — ABNORMAL HIGH (ref 11.5–15.5)
WBC: 3.3 10*3/uL — ABNORMAL LOW (ref 4.0–10.5)
nRBC: 0 % (ref 0.0–0.2)

## 2019-05-19 LAB — COMPREHENSIVE METABOLIC PANEL
ALT: 17 U/L (ref 0–44)
AST: 28 U/L (ref 15–41)
Albumin: 3.3 g/dL — ABNORMAL LOW (ref 3.5–5.0)
Alkaline Phosphatase: 74 U/L (ref 38–126)
Anion gap: 9 (ref 5–15)
BUN: 39 mg/dL — ABNORMAL HIGH (ref 8–23)
CO2: 24 mmol/L (ref 22–32)
Calcium: 8.9 mg/dL (ref 8.9–10.3)
Chloride: 102 mmol/L (ref 98–111)
Creatinine, Ser: 1.93 mg/dL — ABNORMAL HIGH (ref 0.61–1.24)
GFR calc Af Amer: 37 mL/min — ABNORMAL LOW (ref >60)
GFR calc non Af Amer: 32 mL/min — ABNORMAL LOW (ref >60)
Glucose, Bld: 110 mg/dL — ABNORMAL HIGH (ref 70–99)
Potassium: 5 mmol/L (ref 3.5–5.1)
Sodium: 135 mmol/L (ref 135–145)
Total Bilirubin: 0.7 mg/dL (ref 0.3–1.2)
Total Protein: 6.3 g/dL — ABNORMAL LOW (ref 6.5–8.1)

## 2019-05-19 MED ORDER — HEPARIN SOD (PORK) LOCK FLUSH 100 UNIT/ML IV SOLN
500.0000 [IU] | Freq: Once | INTRAVENOUS | Status: AC
  Administered 2019-05-19: 14:00:00 500 [IU] via INTRAVENOUS
  Filled 2019-05-19: qty 5

## 2019-05-19 MED ORDER — SODIUM CHLORIDE 0.9% FLUSH
10.0000 mL | Freq: Once | INTRAVENOUS | Status: AC
  Administered 2019-05-19: 14:00:00 10 mL via INTRAVENOUS
  Filled 2019-05-19: qty 10

## 2019-05-19 NOTE — Progress Notes (Signed)
1400: Per Dr. Grayland Ormond, do not need to wait for lab results. Pt reports that he is feeling better and eating and drinking, and does not feel the need for IVFs at this time. Per Dr. Grayland Ormond, okay to hold IVFs. Pt stable at discharge, and pt denies any concerns or complaints at this time.   1405: Creatinine 1.93, Dr. Grayland Ormond aware, NNO at this time.

## 2019-05-20 ENCOUNTER — Telehealth: Payer: Self-pay | Admitting: Hematology

## 2019-05-20 NOTE — Telephone Encounter (Signed)
FAXED RECORDS TO [facility] RELEASE ID [id number]

## 2019-05-21 ENCOUNTER — Inpatient Hospital Stay (HOSPITAL_BASED_OUTPATIENT_CLINIC_OR_DEPARTMENT_OTHER): Payer: Medicare Other | Admitting: Hospice and Palliative Medicine

## 2019-05-21 DIAGNOSIS — Z85820 Personal history of malignant melanoma of skin: Secondary | ICD-10-CM | POA: Diagnosis not present

## 2019-05-21 DIAGNOSIS — Z9079 Acquired absence of other genital organ(s): Secondary | ICD-10-CM | POA: Diagnosis not present

## 2019-05-21 DIAGNOSIS — E1122 Type 2 diabetes mellitus with diabetic chronic kidney disease: Secondary | ICD-10-CM | POA: Diagnosis not present

## 2019-05-21 DIAGNOSIS — M109 Gout, unspecified: Secondary | ICD-10-CM | POA: Diagnosis not present

## 2019-05-21 DIAGNOSIS — Z515 Encounter for palliative care: Secondary | ICD-10-CM | POA: Diagnosis not present

## 2019-05-21 DIAGNOSIS — N183 Chronic kidney disease, stage 3 unspecified: Secondary | ICD-10-CM | POA: Diagnosis not present

## 2019-05-21 DIAGNOSIS — C61 Malignant neoplasm of prostate: Secondary | ICD-10-CM | POA: Diagnosis not present

## 2019-05-21 DIAGNOSIS — Z79899 Other long term (current) drug therapy: Secondary | ICD-10-CM | POA: Diagnosis not present

## 2019-05-21 DIAGNOSIS — C7951 Secondary malignant neoplasm of bone: Secondary | ICD-10-CM | POA: Diagnosis not present

## 2019-05-21 DIAGNOSIS — E1140 Type 2 diabetes mellitus with diabetic neuropathy, unspecified: Secondary | ICD-10-CM | POA: Diagnosis not present

## 2019-05-21 DIAGNOSIS — C9240 Acute promyelocytic leukemia, not having achieved remission: Secondary | ICD-10-CM | POA: Diagnosis not present

## 2019-05-21 DIAGNOSIS — C9241 Acute promyelocytic leukemia, in remission: Secondary | ICD-10-CM

## 2019-05-21 DIAGNOSIS — R413 Other amnesia: Secondary | ICD-10-CM | POA: Diagnosis not present

## 2019-05-21 DIAGNOSIS — I2510 Atherosclerotic heart disease of native coronary artery without angina pectoris: Secondary | ICD-10-CM | POA: Diagnosis not present

## 2019-05-21 DIAGNOSIS — I129 Hypertensive chronic kidney disease with stage 1 through stage 4 chronic kidney disease, or unspecified chronic kidney disease: Secondary | ICD-10-CM | POA: Diagnosis not present

## 2019-05-21 DIAGNOSIS — Z7952 Long term (current) use of systemic steroids: Secondary | ICD-10-CM | POA: Diagnosis not present

## 2019-05-21 NOTE — Progress Notes (Signed)
Virtual Visit via Telephone Note  I connected with David [name]. on 05/21/19 at  1:30 PM EST by telephone and verified that I am speaking with the correct person using two identifiers.   I discussed the limitations, risks, security and privacy concerns of performing an evaluation and management service by telephone and the availability of in person appointments. I also discussed with the patient that there may be a patient responsible charge related to this service. The patient expressed understanding and agreed to proceed.   History of Present Illness: David [name]. is a 79 y.o. male with multiple medical problems including acute promyelocytic leukemia in remission on current treatment with arsenic and ATRA.  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  Patient follows at [facility] for his APML but receives treatment locally.  He has had fatigue.  Patient was referred to palliative care to help address goals and manage ongoing symptoms.   Observations/Objective: I called and spoke with patient by phone.  Also spoke with his aide.  Patient reports that he is doing reasonably well.  He does have chronic fatigue and uses a walker to ambulate in the house but denies significant changes in performance status or recent falls.  No other distressing symptoms were reported.  Patient reports good oral intake.  His weight trends have been stable.  Overall, patient was not too forthcoming with information on the phone.  Assessment and Plan: Acute promyelocytic leukemia -on treatment with arsenic.  Followed by Dr. Grayland Ormond.  Next clinic visit on 2/15.  Overall, sounds like patient is doing about the same.  No significant changes were reported.  Will follow  Follow Up Instructions: Follow-up telephone visit in 1-2 months   I discussed the assessment and treatment plan with the  patient. The patient was provided an opportunity to ask questions and all were answered. The patient agreed with the plan and demonstrated an understanding of the instructions.   The patient was advised to call back or seek an in-person evaluation if the symptoms worsen or if the condition fails to improve as anticipated.  I provided 5 minutes of non-face-to-face time during this encounter.   Irean Hong, NP

## 2019-05-22 ENCOUNTER — Other Ambulatory Visit: Payer: Self-pay

## 2019-05-22 ENCOUNTER — Inpatient Hospital Stay: Payer: Medicare Other

## 2019-05-22 VITALS — BP 116/82 | HR 75 | Temp 94.9°F | Resp 18

## 2019-05-22 DIAGNOSIS — Z23 Encounter for immunization: Secondary | ICD-10-CM | POA: Diagnosis not present

## 2019-05-22 DIAGNOSIS — C439 Malignant melanoma of skin, unspecified: Secondary | ICD-10-CM

## 2019-05-22 DIAGNOSIS — C9241 Acute promyelocytic leukemia, in remission: Secondary | ICD-10-CM | POA: Diagnosis not present

## 2019-05-22 DIAGNOSIS — R5383 Other fatigue: Secondary | ICD-10-CM | POA: Diagnosis not present

## 2019-05-22 DIAGNOSIS — R531 Weakness: Secondary | ICD-10-CM | POA: Diagnosis not present

## 2019-05-22 MED ORDER — SODIUM CHLORIDE 0.9 % IV SOLN
Freq: Once | INTRAVENOUS | Status: AC
  Filled 2019-05-22: qty 250

## 2019-05-22 MED ORDER — SODIUM CHLORIDE 0.9% FLUSH
10.0000 mL | Freq: Once | INTRAVENOUS | Status: AC | PRN
  Administered 2019-05-22: 10 mL
  Filled 2019-05-22: qty 10

## 2019-05-22 MED ORDER — HEPARIN SOD (PORK) LOCK FLUSH 100 UNIT/ML IV SOLN
500.0000 [IU] | Freq: Once | INTRAVENOUS | Status: AC | PRN
  Administered 2019-05-22: 500 [IU]
  Filled 2019-05-22: qty 5

## 2019-05-23 ENCOUNTER — Other Ambulatory Visit: Payer: Self-pay

## 2019-05-23 ENCOUNTER — Encounter: Payer: Self-pay | Admitting: Oncology

## 2019-05-23 NOTE — Progress Notes (Signed)
Patient prescreened for appointment. Patient has no concerns or questions.  

## 2019-05-24 NOTE — Progress Notes (Signed)
David [name]  [phone] [phone]  ID: [name] OB: [birth year]  MR#: [id number]  BWL#:[id number]  Patient Care Team: [name], MD as PCP - General (Family Medicine) Cira Rue, RN Nurse Navigator as Registered Nurse (Medical Oncology) Acquanetta Chain, DO as Consulting Physician [facility] and Palliative Medicine) [name], MD as Consulting Physician (Hematology and Oncology)  CHIEF COMPLAINT: Acute promyelocytic leukemia in remission  INTERVAL HISTORY: Patient returns to clinic today for repeat laboratory work and further evaluation.  He continues to have chronic weakness and fatigue.  He states he has a good appetite, but admits he does not drink as much fluids as he should.  His daughter reports some increased confusion this past week and he has an appointment with his neurooncologist tomorrow.  He denies any recent fevers or illnesses.  He has a good appetite and denies weight loss.  He denies any chest pain, shortness of breath, cough, or hemoptysis.  He denies any nausea, vomiting, constipation, or diarrhea.  He has no urinary complaints.  Patient offers no further specific complaints today.  REVIEW OF SYSTEMS:   Review of Systems  Constitutional: Positive for malaise/fatigue. Negative for fever and weight loss.  Respiratory: Negative.  Negative for cough, hemoptysis and shortness of breath.   Cardiovascular: Negative.  Negative for chest pain and leg swelling.  Gastrointestinal: Negative.  Negative for abdominal pain and nausea.  Genitourinary: Negative.  Negative for hematuria.  Musculoskeletal: Negative.  Negative for back pain.  Skin: Negative.  Negative for rash.  Neurological: Positive for weakness. Negative for dizziness, focal weakness and headaches.  Psychiatric/Behavioral: Negative.  The patient is not nervous/anxious.     As per HPI. Otherwise, a complete review of systems is negative.  PAST  MEDICAL HISTORY: Past Medical History:  Diagnosis Date  . Anginal pain ([place])   . Arthritis    mostly hands  . Benign familial hematuria   . BPH (benign prostatic hypertrophy)   . Brain cancer ([place])    melanoma with brain met  . Coronary artery disease   . GERD (gastroesophageal reflux disease)   . High cholesterol   . Hypertension   . Hypothyroidism   . Intracerebral hemorrhage ([place])   . Melanoma ([place])   . Metastatic melanoma to lung ([place])   . Mood disorder ([place])   . Myocardial infarction ([place])   . Pacemaker    due to syncope, 3rd degree HB (upgrade to [facility]. Jude CRT-P 02/25/13 (Dr. Uvaldo Rising)  . Rectal bleed    due to NSAIDS  . Renal disorder   . Sepsis ([place]) 12/09/2018  . Skin cancer    melanoma  . Sleep apnea    uses CPAP  . Stroke [facility])     PAST SURGICAL HISTORY: Past Surgical History:  Procedure Laterality Date  . APPLICATION OF CRANIAL NAVIGATION N/A 10/15/2015   Procedure: APPLICATION OF CRANIAL NAVIGATION;  Surgeon: Erline Levine, MD;  Location: [place] [facility]UR[facility] ORS;  Service: Neurosurgery;  Laterality: N/A;  . BIV PACEMAKER GENERATOR CHANGEOUT N/A 04/02/2019   Procedure: BIV PACEMAKER GENERATOR CHANGEOUT;  Surgeon: Evans Lance, MD;  Location: [place] CV LAB;  Service: Cardiovascular;  Laterality: N/A;  . CARDIAC CATHETERIZATION  2013  . CARDIAC SURGERY     bypass X 2  . COLONOSCOPY WITH PROPOFOL N/A 05/08/2016   diverticulosis, int hem, no f/u needed [name])  . CORONARY ARTERY BYPASS GRAFT  2013   LIMA-LAD, SVG-PDA 03/27/11 (Dr. Francee Gentile)  . CRANIOTOMY N/A  10/15/2015   Procedure: LEFT FRONTAL CRANIOTOMY TUMOR EXCISION with Curve;  Surgeon: Erline Levine, MD;  Location: [place] NEURO ORS;  Service: Neurosurgery;  Laterality: N/A;  CRANIOTOMY TUMOR EXCISION  . EYE SURGERY    . FOOT SURGERY  08/2010  . JOINT REPLACEMENT Left    partial knee  . KNEE ARTHROSCOPY  12/2008  . LYMPH NODE BIOPSY    . PACEMAKER INSERTION    . TONSILLECTOMY      FAMILY HISTORY: Family History    Problem Relation Age of Onset  . Cancer Mother 46       lung   . Hypertension Mother   . Hypertension Father   . Heart attack Father   . Heart disease Father   . Rheum arthritis Sister   . Cancer Maternal Grandmother     ADVANCED DIRECTIVES (Y/N):  N  HEALTH MAINTENANCE: Social History   Tobacco Use  . Smoking status: Former Smoker    Packs/day: 1.00    Years: 3.00    Pack years: 3.00    Quit date: 04/10/1957    Years since quitting: 62.1  . Smokeless tobacco: Never Used  Substance Use Topics  . Alcohol use: Yes    Alcohol/week: 14.0 standard drinks    Types: 10 Glasses of wine, 4 Cans of beer per week  . Drug use: No     Colonoscopy:  PAP:  Bone density:  Lipid panel:  Allergies  Allergen Reactions  . Ciprofloxacin Other (See Comments)    Tendonitis  . Ezetimibe Other (See Comments)    Unknown reaction  . Simvastatin Other (See Comments)    Reaction:  Gave pt a fever  Fever - temp of 103.   In 2003    Current Outpatient Medications  Medication Sig Dispense Refill  . acetaminophen (TYLENOL) 325 MG tablet Take 2 tablets (650 mg total) by mouth every 6 (six) hours as needed for mild pain (or Fever >/= 101).    Marland Kitchen acyclovir (ZOVIRAX) 400 MG tablet Take 400 mg by mouth 2 (two) times daily.    Marland Kitchen allopurinol (ZYLOPRIM) 100 MG tablet Take 2 tablets (200 mg total) by mouth daily. 180 tablet 1  . aspirin EC 81 MG tablet Take 81 mg by mouth every evening.     . Blood Glucose Monitoring Suppl (ONETOUCH VERIO) w/Device KIT 1 kit by Does not apply route as directed. E11.65 1 kit 0  . carboxymethylcellulose (REFRESH TEARS) 0.5 % SOLN Apply to eye.    . cephALEXin (KEFLEX) 500 MG capsule Take 500 mg by mouth 2 (two) times daily.    . Cholecalciferol (VITAMIN D3) 25 MCG (1000 UT) CAPS Take 1 capsule (1,000 Units total) by mouth daily. 30 capsule 0  . citalopram (CELEXA) 10 MG tablet Take 1 tablet (10 mg total) by mouth daily. 90 tablet 1  . colchicine 0.6 MG tablet Take 1  tablet (0.6 mg total) by mouth daily as needed (gout flare). Take 2 tablets on the first day of flare 30 tablet 0  . glucose blood test strip E11.65 Use as instructed to check sugars three time daily and as needed 100 each 12  . HUMALOG KWIKPEN 100 UNIT/ML KwikPen     . hydrocortisone cream 1 % Apply topically.    . insulin aspart (NOVOLOG FLEXPEN) 100 UNIT/ML FlexPen Use three times daily with meals as needed per sliding scale: 2 units for every 50 units over 150 (2u for 150-199, 4u for 200-249, 6u for 250-299, etc) 3 mL  1  . ketoconazole (NIZORAL) 2 % shampoo Apply topically.    . Lancets (ONETOUCH ULTRASOFT) lancets E11.65 Use as instructed to check sugars three time daily and as needed 100 each 12  . levothyroxine (SYNTHROID) 137 MCG tablet TAKE 1 TABLET BY MOUTH ONCE DAILY BEFORE BREAKFAST (Patient taking differently: Take 137 mcg by mouth daily before breakfast. ) 30 tablet 11  . methylphenidate (RITALIN) 5 MG tablet Take 1 tablet (5 mg total) by mouth 2 (two) times daily with breakfast and lunch. 60 tablet 0  . metoprolol succinate (TOPROL-XL) 50 MG 24 hr tablet TAKE 1 AND 1/2 TABLET (75 MG) BY MOUTH EVERY DAY WITH OR IMMEDIATELY FOLLOWING A MEAL *DO NOT CRUSH* (Patient taking differently: Take 50 mg by mouth daily. ) 50 tablet 11  . mineral oil-hydrophilic petrolatum (AQUAPHOR) ointment Apply topically.    . ondansetron (ZOFRAN-ODT) 4 MG disintegrating tablet Take 1 tablet (4 mg total) by mouth every 8 (eight) hours as needed for nausea or vomiting. 60 tablet 2  . polyethylene glycol (MIRALAX / GLYCOLAX) 17 g packet Take 17 g by mouth 2 (two) times daily. (Patient taking differently: Take 17 g by mouth daily. ) 14 each 0  . predniSONE (DELTASONE) 5 MG tablet Take 10 mg by mouth daily.     Marland Kitchen senna (SENOKOT) 8.6 MG TABS tablet Take 1 tablet (8.6 mg total) by mouth at bedtime. 120 tablet 0  . senna-docusate (SENOKOT-S) 8.6-50 MG tablet Take by mouth.    . sodium chloride (ALTAMIST SPRAY) 0.65 %  nasal spray Place into the nose.    . testosterone cypionate (DEPOTESTOSTERONE CYPIONATE) 200 MG/ML injection Inject 100 mg into the muscle every 14 (fourteen) days.    . traZODone (DESYREL) 50 MG tablet Take 0.5-1 tablets (25-50 mg total) by mouth at bedtime. (Patient taking differently: Take 25 mg by mouth at bedtime as needed for sleep. ) 30 tablet 1  . tretinoin (VESANOID) 10 MG capsule     . Turmeric Curcumin 500 MG CAPS Take by mouth.     No current facility-administered medications for this visit.   Facility-Administered Medications Ordered in Other Visits  Medication Dose Route Frequency Provider Last Rate Last Admin  . 0.9 %  sodium chloride infusion  1,000 mL Intravenous Once [name], MD      . arsenic trioxide (TRISENOX) 13.3 mg in sodium chloride 0.9 % 250 mL chemo Infusion  0.15 mg/kg/day (Treatment Plan Recorded) Intravenous Daily [name], MD   Stopped at 04/21/19 1404  . arsenic trioxide (TRISENOX) 13.3 mg in sodium chloride 0.9 % 250 mL chemo Infusion  0.15 mg/kg/day (Treatment Plan Recorded) Intravenous Daily [name], MD 132 mL/hr at 05/09/19 1340 13.3 mg at 05/09/19 1340  . sodium chloride flush (NS) 0.9 % injection 10 mL  10 mL Intracatheter PRN [name], MD   10 mL at 05/09/19 1318    OBJECTIVE: Vitals:   05/26/19 1355  BP: 129/89  Pulse: 76  Resp: 16  SpO2: 95%     Body mass index is 29.39 kg/m.    ECOG FS:1 - Symptomatic but completely ambulatory  General: Thin, no acute distress. Eyes: Pink conjunctiva, anicteric sclera. HEENT: Normocephalic, moist mucous membranes. Lungs: No audible wheezing or coughing. Heart: Regular rate and rhythm. Abdomen: Soft, nontender, no obvious distention. Musculoskeletal: No edema, cyanosis, or clubbing. Neuro: Alert, answering all questions appropriately. Cranial nerves grossly intact. Skin: No rashes or petechiae noted. Psych: Normal affect.   LAB RESULTS:  Lab Results  Component Value  Date   NA 138 05/26/2019   K 4.4 05/26/2019   CL 102 05/26/2019   CO2 24 05/26/2019   GLUCOSE 105 (H) 05/26/2019   BUN 42 (H) 05/26/2019   CREATININE 1.98 (H) 05/26/2019   CALCIUM 9.1 05/26/2019   PROT 6.4 (L) 05/26/2019   ALBUMIN 3.4 (L) 05/26/2019   AST 27 05/26/2019   ALT 14 05/26/2019   ALKPHOS 74 05/26/2019   BILITOT 0.5 05/26/2019   GFRNONAA 31 (L) 05/26/2019   GFRAA 36 (L) 05/26/2019    Lab Results  Component Value Date   WBC 4.3 05/26/2019   NEUTROABS 2.9 05/26/2019   HGB 12.2 (L) 05/26/2019   HCT 37.4 (L) 05/26/2019   MCV 103.0 (H) 05/26/2019   PLT 188 05/26/2019     STUDIES: CT Head W Wo Contrast  Result Date: 05/14/2019 CLINICAL DATA:  Follow-up metastatic means. EXAM: CT HEAD WITHOUT AND WITH CONTRAST TECHNIQUE: Contiguous axial images were obtained from the base of the skull through the vertex without and with intravenous contrast CONTRAST:  24m OMNIPAQUE IOHEXOL 300 MG/ML  SOLN COMPARISON:  02/14/2018 FINDINGS: Brain: Enhancing focus in the right cerebellum is no longer seen. Postoperative left frontal lobe with prominent volume loss and encephalomalacia. No abnormal enhancement throughout the brain. No acute or interval infarct, hemorrhage, hydrocephalus, or collection. Vascular: Major vessels are enhancing Skull: Unremarkable remote left frontal craniotomy. Negative for mass. Sinuses/Orbits: Negative IMPRESSION: No evidence of active metastatic disease. Stable post treatment brain. Electronically Signed   By: JMonte FantasiaM.D.   On: 05/14/2019 11:51    ASSESSMENT: Acute promyelocytic leukemia in remission  PLAN:    1. Acute promyelocytic leukemia in remission: [facility].  Peripheral blood FISH-PML t(15:17) noted complete molecular remission.  Continue with maintenance chemotherapy using ATRA 14 days on 14 days off along with arsenic trioxide at 0.15 mg/kg/day Monday through  Friday for 4 weeks then 4 weeks off.  Each cycle will be a total of 8 weeks.  Pending patient's toleration of treatment, will plan to do a total of 4 cycles or 32 weeks of treatment.  Patient is currently in his 2 weeks off without treatment.  Return to clinic in 2 weeks for further evaluation and consideration of cycle 2, day 1 of arsenic and ATRA for 14 days.  He will require an EKG weekly throughout his 4 weeks of arsenic.  Patient will also continue to return to clinic every Monday and Thursday throughout treatment for consideration of IV fluids. 2.  Nausea: Patient does not complain of this today.  Continue ondansetron as needed. 3.  Rash: Continue topical treatments as directed.  Can also consider prednisone taper if necessary. 4.  Cardiac disease/biventricular pacer: Given patient's biventricular pacing, he has a significantly abnormal EKG with a prolonged QTC of approximately 560.  Although treatment should be discontinued with a QTC greater than 500, this is patient's baseline and will continue to monitor with weekly EKGs.  Continue close follow-up with cardiology as scheduled.  5.  Melanoma: No obvious evidence of recurrence.  Monitor. 6.  Prostate cancer: Patient's most recent PSA in June 2020 was reported at 4.35. 7.  Weakness and fatigue: Likely multifactorial.  Patient will have scheduled IV fluids every Monday and Thursday. 8.  Renal insufficiency: Patient creatinine continues to slowly trend up and is now 1.98.  Have encouraged increased fluid intake.  IV fluids as above. 9.  Leukopenia: Resolved. 10.  Confusion: Patient cannot get an MRI given his biventricular pacer, but CT scan on May 14, 2019 revealed no evidence of recurrent or metastatic disease.  He has follow-up with neuro-oncology tomorrow.  Patient expressed understanding and was in agreement with this plan. He also understands that He can call clinic at any time with any questions, concerns, or complaints.     [name], MD   05/27/2019 8:46 AM

## 2019-05-26 ENCOUNTER — Inpatient Hospital Stay (HOSPITAL_BASED_OUTPATIENT_CLINIC_OR_DEPARTMENT_OTHER): Payer: Medicare Other | Admitting: Oncology

## 2019-05-26 ENCOUNTER — Inpatient Hospital Stay: Payer: Medicare Other | Admitting: *Deleted

## 2019-05-26 ENCOUNTER — Other Ambulatory Visit: Payer: Self-pay

## 2019-05-26 ENCOUNTER — Inpatient Hospital Stay: Payer: Medicare Other

## 2019-05-26 VITALS — BP 129/89 | HR 76 | Resp 16 | Wt 201.9 lb

## 2019-05-26 DIAGNOSIS — C9241 Acute promyelocytic leukemia, in remission: Secondary | ICD-10-CM

## 2019-05-26 DIAGNOSIS — R5383 Other fatigue: Secondary | ICD-10-CM | POA: Diagnosis not present

## 2019-05-26 DIAGNOSIS — Z95828 Presence of other vascular implants and grafts: Secondary | ICD-10-CM

## 2019-05-26 DIAGNOSIS — C439 Malignant melanoma of skin, unspecified: Secondary | ICD-10-CM

## 2019-05-26 DIAGNOSIS — R531 Weakness: Secondary | ICD-10-CM | POA: Diagnosis not present

## 2019-05-26 LAB — COMPREHENSIVE METABOLIC PANEL
ALT: 14 U/L (ref 0–44)
AST: 27 U/L (ref 15–41)
Albumin: 3.4 g/dL — ABNORMAL LOW (ref 3.5–5.0)
Alkaline Phosphatase: 74 U/L (ref 38–126)
Anion gap: 12 (ref 5–15)
BUN: 42 mg/dL — ABNORMAL HIGH (ref 8–23)
CO2: 24 mmol/L (ref 22–32)
Calcium: 9.1 mg/dL (ref 8.9–10.3)
Chloride: 102 mmol/L (ref 98–111)
Creatinine, Ser: 1.98 mg/dL — ABNORMAL HIGH (ref 0.61–1.24)
GFR calc Af Amer: 36 mL/min — ABNORMAL LOW (ref >60)
GFR calc non Af Amer: 31 mL/min — ABNORMAL LOW (ref >60)
Glucose, Bld: 105 mg/dL — ABNORMAL HIGH (ref 70–99)
Potassium: 4.4 mmol/L (ref 3.5–5.1)
Sodium: 138 mmol/L (ref 135–145)
Total Bilirubin: 0.5 mg/dL (ref 0.3–1.2)
Total Protein: 6.4 g/dL — ABNORMAL LOW (ref 6.5–8.1)

## 2019-05-26 LAB — CBC WITH DIFFERENTIAL/PLATELET
Abs Immature Granulocytes: 0.1 10*3/uL — ABNORMAL HIGH (ref 0.00–0.07)
Basophils Absolute: 0 10*3/uL (ref 0.0–0.1)
Basophils Relative: 1 %
Eosinophils Absolute: 0.2 10*3/uL (ref 0.0–0.5)
Eosinophils Relative: 4 %
HCT: 37.4 % — ABNORMAL LOW (ref 39.0–52.0)
Hemoglobin: 12.2 g/dL — ABNORMAL LOW (ref 13.0–17.0)
Immature Granulocytes: 2 %
Lymphocytes Relative: 11 %
Lymphs Abs: 0.5 10*3/uL — ABNORMAL LOW (ref 0.7–4.0)
MCH: 33.6 pg (ref 26.0–34.0)
MCHC: 32.6 g/dL (ref 30.0–36.0)
MCV: 103 fL — ABNORMAL HIGH (ref 80.0–100.0)
Monocytes Absolute: 0.6 10*3/uL (ref 0.1–1.0)
Monocytes Relative: 14 %
Neutro Abs: 2.9 10*3/uL (ref 1.7–7.7)
Neutrophils Relative %: 68 %
Platelets: 188 10*3/uL (ref 150–400)
RBC: 3.63 MIL/uL — ABNORMAL LOW (ref 4.22–5.81)
RDW: 16.5 % — ABNORMAL HIGH (ref 11.5–15.5)
WBC: 4.3 10*3/uL (ref 4.0–10.5)
nRBC: 0.5 % — ABNORMAL HIGH (ref 0.0–0.2)

## 2019-05-26 MED ORDER — SODIUM CHLORIDE 0.9 % IV SOLN
Freq: Once | INTRAVENOUS | Status: AC
  Filled 2019-05-26: qty 250

## 2019-05-26 MED ORDER — HEPARIN SOD (PORK) LOCK FLUSH 100 UNIT/ML IV SOLN
INTRAVENOUS | Status: AC
  Filled 2019-05-26: qty 5

## 2019-05-26 MED ORDER — SODIUM CHLORIDE 0.9% FLUSH
10.0000 mL | Freq: Once | INTRAVENOUS | Status: AC
  Administered 2019-05-26: 13:00:00 10 mL via INTRAVENOUS
  Filled 2019-05-26: qty 10

## 2019-05-26 MED ORDER — HEPARIN SOD (PORK) LOCK FLUSH 100 UNIT/ML IV SOLN
500.0000 [IU] | Freq: Once | INTRAVENOUS | Status: AC | PRN
  Administered 2019-05-26: 500 [IU]
  Filled 2019-05-26: qty 5

## 2019-05-27 ENCOUNTER — Inpatient Hospital Stay: Payer: Medicare Other | Attending: Internal Medicine | Admitting: Internal Medicine

## 2019-05-27 ENCOUNTER — Other Ambulatory Visit: Payer: Self-pay

## 2019-05-27 VITALS — BP 140/89 | HR 75 | Temp 98.7°F | Resp 17 | Ht 69.5 in | Wt 198.8 lb

## 2019-05-27 DIAGNOSIS — Z950 Presence of cardiac pacemaker: Secondary | ICD-10-CM | POA: Insufficient documentation

## 2019-05-27 DIAGNOSIS — I252 Old myocardial infarction: Secondary | ICD-10-CM | POA: Insufficient documentation

## 2019-05-27 DIAGNOSIS — I10 Essential (primary) hypertension: Secondary | ICD-10-CM | POA: Insufficient documentation

## 2019-05-27 DIAGNOSIS — C439 Malignant melanoma of skin, unspecified: Secondary | ICD-10-CM | POA: Diagnosis not present

## 2019-05-27 DIAGNOSIS — Z801 Family history of malignant neoplasm of trachea, bronchus and lung: Secondary | ICD-10-CM | POA: Diagnosis not present

## 2019-05-27 DIAGNOSIS — E039 Hypothyroidism, unspecified: Secondary | ICD-10-CM | POA: Insufficient documentation

## 2019-05-27 DIAGNOSIS — Z7982 Long term (current) use of aspirin: Secondary | ICD-10-CM | POA: Diagnosis not present

## 2019-05-27 DIAGNOSIS — Z809 Family history of malignant neoplasm, unspecified: Secondary | ICD-10-CM | POA: Diagnosis not present

## 2019-05-27 DIAGNOSIS — E785 Hyperlipidemia, unspecified: Secondary | ICD-10-CM | POA: Diagnosis not present

## 2019-05-27 DIAGNOSIS — G3184 Mild cognitive impairment, so stated: Secondary | ICD-10-CM | POA: Insufficient documentation

## 2019-05-27 DIAGNOSIS — G9349 Other encephalopathy: Secondary | ICD-10-CM | POA: Diagnosis not present

## 2019-05-27 DIAGNOSIS — C7931 Secondary malignant neoplasm of brain: Secondary | ICD-10-CM | POA: Diagnosis not present

## 2019-05-27 DIAGNOSIS — R5383 Other fatigue: Secondary | ICD-10-CM | POA: Diagnosis not present

## 2019-05-27 DIAGNOSIS — Z87891 Personal history of nicotine dependence: Secondary | ICD-10-CM | POA: Insufficient documentation

## 2019-05-27 DIAGNOSIS — C9240 Acute promyelocytic leukemia, not having achieved remission: Secondary | ICD-10-CM | POA: Diagnosis not present

## 2019-05-27 NOTE — Progress Notes (Signed)
[facility] [place], [place] [address] [phone]   Interval Evaluation  Date of Service: 05/27/19 Patient Name: David [name]. Patient MRN: [id number] Patient DOB: [birth year] Provider: Ventura Sellers, MD  Identifying Statement:  [name] is a 79 y.o. male with melanoma, APML, brain metastases  Primary Cancer: Melanoma Stage IV  Oncologic History: Oncology History Overview Note  Metastatic melanoma ([place])   Staging form: Melanoma of the Skin, AJCC 7th Edition     Clinical stage from 09/21/2015: Stage IV [place], [place], [place]) - Signed by [name], MD on 10/09/2015     Malignant melanoma ([place])  09/16/2015 Imaging   PET scan showed a hypermetabolic 8.3GP lingular nodule, and a 90m right upper lobe lung nodule, left subclavicular nodes, and bone metastasis in the proximal right humerus and right femur.    09/21/2015 Initial Diagnosis   Metastatic melanoma ([facility]   09/21/2015 Initial Biopsy   Left subclavicular lymph node biopsy showed prostatic of melanoma   09/21/2015 Miscellaneous   BRAF V600K mutation (+)    10/04/2015 Imaging   CT head with without contrast showed a 2.0 x 1.6 x 1.7 cm superficial left frontal hyperdense lesion, with postcontrast enhancement, lying within the previous hemorrhagic area, concerning for solitary melanoma metastasis   10/06/2015 - 09/06/2016 Chemotherapy   Nivolumab 2475mevery 2 weeks, held due to his dexamethasone for radionecrosis    10/13/2015 - 10/13/2015 Radiation Therapy   10/13/2015 Preop SRS treatment: 14 Gy  to the left frontal lesion in 1 fraction   10/15/2015 Surgery   left front craniotomy and tumor excision    10/15/2015 Pathology Results   left frontal brain mass excision showed metastatic melanoma, 1.5X1.5X0.6cm   01/13/2016 Imaging   CT head without and with contrast showed ill-defined enhancement and dural thickening of the left frontal resection cavity may represent a  post treatment changes or residual neoplasm. A new 6 mm hypodensity in the right cerebellar hemisphere with uncertainty enhancement may represent a metastasis or small brain parenchymal hemorrhage.    01/27/2016 - 01/27/2016 Radiation Therapy   01/27/16 SRS Treatment:  20 Gy in 1 fraction to a 6 mm right cerebellar brain met    02/2016 - 05/24/2018 Chemotherapy   Monthly Xgeva starting 02/2016, changed to every 3 months on 01/30/2018. Stopped on 05/24/2018   03/29/2016 PET scan   PET 03/29/2016 IMPRESSION: 1. Overall there has been an interval response to therapy. The index lesions sided on previous exam it are less hypermetabolic when compared with the previous exam. 2. There is an intramuscular lesion within the right masseter which has increased FDG uptake compared with previous exam. 3. No new sites of disease identified. 4. Aortic atherosclerosis and infrarenal abdominal aortic aneurysm. Recommend followup by ultrasound in 3 years. This recommendation follows ACR consensus guidelines: White Paper of the ACR Incidental Findings Committee II on Vascular Findings. J Joellyn Ruedadiol 2013; 10:789-794   04/20/2016 Imaging   CT Head w wo contrast 1. Mild patchy enhancement at the left anterior frontal lobe treatment site (series 11, image 39) without mass effect and stable surrounding white matter hypodensity without strong evidence of acute vasogenic edema. Recommend continued CT surveillance. 2. No new metastatic disease or new intracranial abnormality identified. 3. Probable benign 14 mm left parietal bone lucent lesion, unchanged since May 2017.   05/06/2016 - 05/08/2016 Hospital Admission   Pt was admitted for rectal bleeding for one day.  Breathing spontaneously resolved, hemoglobin dropped from 15 to 10.5, did not require blood transfusion. Colonoscopy showed no active bleeding, except diverticulosis and hemorrhoid. He was discharged to home.   05/08/2016 Procedure   Colonoscopy    - Stool in the ascending colon. Fluid aspiration performed. - Diverticulosis in the entire examined colon. - Non-bleeding internal hemorrhoids. - The examination was otherwise normal on direct and retroflexion views.   05/29/2016 Imaging   PET Image Restaging IMPRESSION: 1. Stable exam with no evidence progression. 2. Photopenia in the LEFT frontal lobe at prior surgical site. 3. Stable mildly hypermetabolic RIGHT paratracheal node. This may represent a thyroid nodule. 4. Stable size of minimally metabolic lingular pulmonary nodule. 5. Stable skeletal lesion of the RIGHT iliac crest 6. No cutaneous metabolic lesion.   07/20/2016 Imaging   CT Head W WO Contrast IMPRESSION: Pronounced change in the left frontal region. Much more regional vasogenic edema. Relatively stable appearance of the abnormal enhancement extending from the lateral margin of the frontal horn of left lateral ventricle to the frontal brain surface. New region of abnormal enhancement surrounding the frontal horn of the left lateral ventricle measuring approximately 3.2 cm. The differential diagnosis is recurrent tumor versus radiation necrosis. The patient is reportedly not an MRI candidate. This area is large enough that CT perfusion could possibly make a reasonable differentiation.   09/02/2016 Imaging   CT Head 09/02/16 IMPRESSION: Somewhat mixed pattern of slight posterior progression of pleomorphic enhancement surrounding the LEFT lateral ventricle although no significant mass effect, and reduced vasogenic edema compared with the scan in April.   09/27/2016 - 11/02/2016 Chemotherapy   Start dabrafenib and trametinib 09/27/16, stopped due to severe fatigue     10/05/2016 Imaging   CT Head w & w/o contrast  IMPRESSION: 1. Decreased size of the enhancing area within the left frontal lobe treatment site. Surrounding edema has also decreased. The findings support continued evolution of radiation necrosis. 2.  No new enhancing lesions.   10/18/2016 PET scan   IMPRESSION: 1. New focal hypermetabolism in the proximal sigmoid colon with associated focal colonic wall thickening and mild pericolonic fat stranding at the site of a proximal sigmoid diverticulum, favored to represent mild acute sigmoid diverticulitis. Metastatic disease is considered less likely. Clinical correlation is necessary. Consider appropriate antibiotic therapy, as clinically warranted. Attention to this region recommended on future PET-CT follow-up. 2. Otherwise complete metabolic response with no evidence of hypermetabolic metastatic disease. Lingular pulmonary nodule is slightly decreased in size and demonstrates no significant metabolism. 3. No residual hypermetabolism in the right thyroid lobe nodule, which is stable in size. 4. Aortic Atherosclerosis (ICD10-I70.0). Stable 3.1 cm infrarenal abdominal aortic aneurysm. Aortic aneurysm NOS (ICD10-I71.9). Recommend followup by ultrasound in 3 years. This recommendation follows ACR consensus guidelines: White Paper of the ACR Incidental Findings Committee II on Vascular Findings. Joellyn Rued Radiol 2013; 89:169-450.   11/01/2016 - 11/04/2016 Hospital Admission   Patient admitted to the hospital due to hypercalcemia where he received IV fluids and pamidronate   12/13/2016 PET scan   PET 12/13/16 IMPRESSION: 1. No findings to suggest metastatic disease on today's examination. 2. Previously noted hypermetabolism in the sigmoid colon has resolved, presumably indicative of a focus of diverticulitis on the prior study. Today's study does demonstrate relatively diffuse hypermetabolism throughout the cecum, ascending colon and proximal transverse colon where there is some mild mural thickening, favored to reflect mild chronic inflammation related to underlying diverticular disease. No definite inflammatory changes on the CT portion  of the examination to strongly suggest an active  acute diverticulitis at this time. No discrete lesion to suggest neoplasm. 3. Previously noted 8 mm lingular nodule is stable in size and known remains non hypermetabolic. This is nonspecific but reassuring. Continued attention on future followup studies is recommended. 4. Aortic atherosclerosis, in addition to left main and 3 vessel coronary artery disease. Status post median sternotomy for CABG including LIMA to the LAD. In addition, there is mild fusiform aneurysmal dilatation of the infrarenal abdominal aorta (3.1 cm in diameter) as well as aneurysmal dilatation of the left common iliac   12/18/2016 - 12/31/2017 Chemotherapy   Restarted nivolamab 218m every 2 weeks, with tapering dose dexamethasone     04/18/2017 Imaging   PET Scan IMPRESSION: 1. No hypermetabolic lesion to suggest active malignancy. 2. Stable 8 mm lingular nodule is not currently hypermetabolic but merit surveillance. Infrarenal abdominal aortic aneurysm 3.1 cm in diameter. Recommend followup by [facility] 3 years. 3. Other imaging findings of potential clinical significance: Postoperative findings in the left frontal lobe. Right renal cyst. Aortic Atherosclerosis (ICD10-I70.0). Osteoarthritis. Lumbar scoliosis. Left knee effusion. Colonic diverticulosis.    10/10/2017 PET scan   IMPRESSION: 1. Small focus of FDG accumulation in the subcutaneous fat of the right gluteal region, nonspecific. Attention on follow-up suggested. 2. Otherwise stable exam without new or progressive hypermetabolic disease in the interval since prior PET-CT. 3. Stable 8 mm nodule in the lingula. No evidence for hypermetabolism on PET imaging. 4.  Aortic Atherosclerois (ICD10-170.0)    01/29/2018 PET scan   IMPRESSION: 1. Small focus of hypermetabolism in the lower left paratracheal neck, which appears to localize to the posterior left thyroid lobe, without discrete thyroid nodule on the CT images, decreased in metabolism since 10/10/2017 PET-CT.  This finding is nonspecific and may represent a hypermetabolic thyroid nodule. Consider thyroid ultrasound correlation. 2. No new hypermetabolic findings suspicious for metastatic disease. Previously described low level metabolism in the subcutaneous right gluteal region has resolved, compatible with resolved inflammatory focus. No evidence of recurrent hypermetabolism within the stable lingular pulmonary nodule. No evidence of recurrent osseous hypermetabolism. 3. Stable infrarenal 3.4 cm Abdominal Aortic Aneurysm (ICD10-I71.9). Recommend follow-up aortic ultrasound in 3 years. This recommendation follows ACR consensus guidelines: White Paper of the ACR Incidental Findings Committee II on Vascular Findings. J Am Coll Radiol 2013;; 00:349-179 4.  Aortic Atherosclerosis (ICD10-I70.0).   09/18/2018 PET scan   PET 09/18/18  IMPRESSION: 1. No findings of active malignancy. The known small right cerebellar mass is not readily apparent on PET-CT but was shown on today's CT head. 2. 3.2 cm infrarenal abdominal aortic aneurysm. Recommend followup by [facility] 3 years. This recommendation follows ACR consensus guidelines: White Paper of the ACR Incidental Findings Committee II on Vascular Findings. J Am Coll Radiol 2013; 10:789-794. Aortic aneurysm NOS (ICD10-I71.9). 3. Other imaging findings of potential clinical significance: Left frontal lobe encephalomalacia underlying the craniotomy site. Chronic paranasal sinusitis. Aortic Atherosclerosis (ICD10-I70.0). Coronary atherosclerosis. Mild cardiomegaly. Colonic diverticulosis. Dextroconvex lumbar scoliosis. Small left knee effusion with mild synovitis. Postoperative findings in the right foot.   09/18/2018 Imaging   CT head  IMPRESSION: 1. No evidence of new intracranial metastases or acute abnormality. 2. Unchanged 4 mm right cerebellar metastasis. 3. Stable post treatment changes in the left frontal lobe.   APML (acute promyelocytic  leukemia) in [place]  01/08/2019 Initial Diagnosis   APML (acute promyelocytic leukemia) in remission ([facility]   04/14/2019 -  Chemotherapy   The  patient had palonosetron (ALOXI) injection 0.25 mg, 0.25 mg, Intravenous,  Once, 1 of 4 cycles Administration: 0.25 mg (04/15/2019), 0.25 mg (04/17/2019), 0.25 mg (04/21/2019), 0.25 mg (04/23/2019), 0.25 mg (04/25/2019), 0.25 mg (04/28/2019), 0.25 mg (04/30/2019), 0.25 mg (05/02/2019), 0.25 mg (05/05/2019), 0.25 mg (05/07/2019), 0.25 mg (05/09/2019), 0.25 mg (04/18/2019) arsenic trioxide (TRISENOX) 13.3 mg in sodium chloride 0.9 % 250 mL chemo Infusion, 0.15 mg/kg = 13.3 mg (100 % of original dose 0.15 mg/kg), Intravenous,  Once, 1 of 4 cycles Dose modification: 0.15 mg/kg/day (original dose 0.15 mg/kg, Cycle 1, Reason: Other (see comments)), 0.15 mg/kg (original dose 0.15 mg/kg, Cycle 1, Reason: Other (see comments), Comment: change to mg/kg) Administration: 13.3 mg (04/21/2019), 13.3 mg (04/22/2019), 13.3 mg (04/23/2019), 13.3 mg (04/24/2019), 13.3 mg (04/25/2019), 13.3 mg (04/28/2019), 13.3 mg (04/29/2019), 13.3 mg (04/30/2019), 13.3 mg (05/01/2019), 13.3 mg (05/02/2019), 13.3 mg (05/05/2019), 13.3 mg (05/06/2019), 13.3 mg (05/07/2019), 13.3 mg (05/08/2019), 13.3 mg (05/09/2019)  for chemotherapy treatment.      Interval History:  Saifullah Jolley. presents today for follow up with his daughter.  They describe recent difficulties related to treatment and complications of APML.  Currently undergoing daily arsenic consolidation after induction during the fall.  His main complaint is fatigue, both physical and mental over the rigors of therapy and continual medical problems.  He experiencing waxing and waning mental faculties; some days very "normal", other times confused and unsteady. Otherwise no specific new or progressive neurologic deficits.  No seizures or headaches.     Medications: Current Outpatient Medications on File Prior to Visit  Medication Sig Dispense Refill    . acetaminophen (TYLENOL) 325 MG tablet Take 2 tablets (650 mg total) by mouth every 6 (six) hours as needed for mild pain (or Fever >/= 101).    Marland Kitchen acyclovir (ZOVIRAX) 400 MG tablet Take 400 mg by mouth 2 (two) times daily.    Marland Kitchen allopurinol (ZYLOPRIM) 100 MG tablet Take 2 tablets (200 mg total) by mouth daily. 180 tablet 1  . aspirin EC 81 MG tablet Take 81 mg by mouth every evening.     . Blood Glucose Monitoring Suppl (ONETOUCH VERIO) w/Device KIT 1 kit by Does not apply route as directed. E11.65 1 kit 0  . carboxymethylcellulose (REFRESH TEARS) 0.5 % SOLN Apply to eye.    . Cholecalciferol (VITAMIN D3) 25 MCG (1000 UT) CAPS Take 1 capsule (1,000 Units total) by mouth daily. 30 capsule 0  . citalopram (CELEXA) 10 MG tablet Take 1 tablet (10 mg total) by mouth daily. 90 tablet 1  . glucose blood test strip E11.65 Use as instructed to check sugars three time daily and as needed 100 each 12  . hydrocortisone cream 1 % Apply topically.    Marland Kitchen ketoconazole (NIZORAL) 2 % shampoo Apply topically.    Marland Kitchen levothyroxine (SYNTHROID) 137 MCG tablet TAKE 1 TABLET BY MOUTH ONCE DAILY BEFORE BREAKFAST (Patient taking differently: Take 137 mcg by mouth daily before breakfast. ) 30 tablet 11  . methylphenidate (RITALIN) 5 MG tablet Take 1 tablet (5 mg total) by mouth 2 (two) times daily with breakfast and lunch. 60 tablet 0  . metoprolol succinate (TOPROL-XL) 50 MG 24 hr tablet TAKE 1 AND 1/2 TABLET (75 MG) BY MOUTH EVERY DAY WITH OR IMMEDIATELY FOLLOWING A MEAL *DO NOT CRUSH* (Patient taking differently: Take 50 mg by mouth daily. ) 50 tablet 11  . mineral oil-hydrophilic petrolatum (AQUAPHOR) ointment Apply topically.    . ondansetron (ZOFRAN-ODT) 4 MG disintegrating  tablet Take 1 tablet (4 mg total) by mouth every 8 (eight) hours as needed for nausea or vomiting. 60 tablet 2  . polyethylene glycol (MIRALAX / GLYCOLAX) 17 g packet Take 17 g by mouth 2 (two) times daily. (Patient taking differently: Take 17 g by  mouth daily. ) 14 each 0  . senna (SENOKOT) 8.6 MG TABS tablet Take 1 tablet (8.6 mg total) by mouth at bedtime. 120 tablet 0  . senna-docusate (SENOKOT-S) 8.6-50 MG tablet Take by mouth.    . sodium chloride (ALTAMIST SPRAY) 0.65 % nasal spray Place into the nose.    . testosterone cypionate (DEPOTESTOSTERONE CYPIONATE) 200 MG/ML injection Inject 100 mg into the muscle every 14 (fourteen) days.    . traZODone (DESYREL) 50 MG tablet Take 0.5-1 tablets (25-50 mg total) by mouth at bedtime. (Patient taking differently: Take 25 mg by mouth at bedtime as needed for sleep. ) 30 tablet 1  . tretinoin (VESANOID) 10 MG capsule     . Turmeric Curcumin 500 MG CAPS Take by mouth.    . colchicine 0.6 MG tablet Take 1 tablet (0.6 mg total) by mouth daily as needed (gout flare). Take 2 tablets on the first day of flare (Patient not taking: Reported on 05/27/2019) 30 tablet 0  . HUMALOG KWIKPEN 100 UNIT/ML KwikPen     . insulin aspart (NOVOLOG FLEXPEN) 100 UNIT/ML FlexPen Use three times daily with meals as needed per sliding scale: 2 units for every 50 units over 150 (2u for 150-199, 4u for 200-249, 6u for 250-299, etc) (Patient not taking: Reported on 05/27/2019) 3 mL 1  . Lancets (ONETOUCH ULTRASOFT) lancets E11.65 Use as instructed to check sugars three time daily and as needed (Patient not taking: Reported on 05/27/2019) 100 each 12   Current Facility-Administered Medications on File Prior to Visit  Medication Dose Route Frequency Provider Last Rate Last Admin  . 0.9 %  sodium chloride infusion  1,000 mL Intravenous Once [name], MD      . arsenic trioxide (TRISENOX) 13.3 mg in sodium chloride 0.9 % 250 mL chemo Infusion  0.15 mg/kg/day (Treatment Plan Recorded) Intravenous Daily [name], MD   Stopped at 04/21/19 1404  . arsenic trioxide (TRISENOX) 13.3 mg in sodium chloride 0.9 % 250 mL chemo Infusion  0.15 mg/kg/day (Treatment Plan Recorded) Intravenous Daily [name], MD 132 mL/hr at  05/09/19 1340 13.3 mg at 05/09/19 1340  . sodium chloride flush (NS) 0.9 % injection 10 mL  10 mL Intracatheter PRN [name], MD   10 mL at 05/09/19 1318    Allergies:  Allergies  Allergen Reactions  . Ciprofloxacin Other (See Comments)    Tendonitis  . Ezetimibe Other (See Comments)    Unknown reaction  . Simvastatin Other (See Comments)    Reaction:  Gave pt a fever  Fever - temp of 103.   In 2003   Past Medical History:  Past Medical History:  Diagnosis Date  . Anginal pain ([place])   . Arthritis    mostly hands  . Benign familial hematuria   . BPH (benign prostatic hypertrophy)   . Brain cancer ([place])    melanoma with brain met  . Coronary artery disease   . GERD (gastroesophageal reflux disease)   . High cholesterol   . Hypertension   . Hypothyroidism   . Intracerebral hemorrhage ([place])   . Melanoma ([place])   . Metastatic melanoma to lung ([place])   . Mood disorder ([place])   .  Myocardial infarction ([place])   . Pacemaker    due to syncope, 3rd degree HB (upgrade to [facility]. Jude CRT-P 02/25/13 (Dr. Uvaldo Rising)  . Rectal bleed    due to NSAIDS  . Renal disorder   . Sepsis ([place]) 12/09/2018  . Skin cancer    melanoma  . Sleep apnea    uses CPAP  . Stroke [facility])    Past Surgical History:  Past Surgical History:  Procedure Laterality Date  . APPLICATION OF CRANIAL NAVIGATION N/A 10/15/2015   Procedure: APPLICATION OF CRANIAL NAVIGATION;  Surgeon: Erline Levine, MD;  Location: [facility];  Service: Neurosurgery;  Laterality: N/A;  . BIV PACEMAKER GENERATOR CHANGEOUT N/A 04/02/2019   Procedure: BIV PACEMAKER GENERATOR CHANGEOUT;  Surgeon: Evans Lance, MD;  Location: [facility] LAB;  Service: Cardiovascular;  Laterality: N/A;  . CARDIAC CATHETERIZATION  2013  . CARDIAC SURGERY     bypass X 2  . COLONOSCOPY WITH PROPOFOL N/A 05/08/2016   diverticulosis, int hem, no f/u needed [name])  . CORONARY ARTERY BYPASS GRAFT  2013   LIMA-LAD, SVG-PDA 03/27/11 (Dr. Francee Gentile)  .  CRANIOTOMY N/A 10/15/2015   Procedure: LEFT FRONTAL CRANIOTOMY TUMOR EXCISION with Curve;  Surgeon: Erline Levine, MD;  Location: [facility];  Service: Neurosurgery;  Laterality: N/A;  CRANIOTOMY TUMOR EXCISION  . EYE SURGERY    . FOOT SURGERY  08/2010  . JOINT REPLACEMENT Left    partial knee  . KNEE ARTHROSCOPY  12/2008  . LYMPH NODE BIOPSY    . PACEMAKER INSERTION    . TONSILLECTOMY     Social History:  Social History   Socioeconomic History  . Marital status: Married    Spouse name: Not on file  . Number of children: 2  . Years of education: Not on file  . Highest education level: Not on file  Occupational History  . Occupation: retired Pharmacist, community  Tobacco Use  . Smoking status: Former Smoker    Packs/day: 1.00    Years: 3.00    Pack years: 3.00    Quit date: 04/10/1957    Years since quitting: 62.1  . Smokeless tobacco: Never Used  Substance and Sexual Activity  . Alcohol use: Yes    Alcohol/week: 14.0 standard drinks    Types: 10 Glasses of wine, 4 Cans of beer per week  . Drug use: No  . Sexual activity: Not Currently  Other Topics Concern  . Not on file  Social History Narrative   Lives with wife [name]) with dementia in [place] children - daughter [name] and son    Occ: retired Pharmacist, community practiced in [place]   Has living will   [name] is health care Normal   Requests DNR-- done 09/14/15, declines tube feeds - verified 03/2019   Social Determinants of Health   Financial Resource Strain:   . Difficulty of Paying Living Expenses: Not on file  Food Insecurity:   . Worried About Charity fundraiser in the Last Year: Not on file  . Ran Out of Food in the Last Year: Not on file  Transportation Needs:   . Lack of Transportation (Medical): Not on file  . Lack of Transportation (Non-Medical): Not on file  Physical Activity:   . Days of Exercise per Week: Not on file  . Minutes of Exercise per Session: Not on file  Stress:   . Feeling of  Stress : Not on file  Social Connections:   .  Frequency of Communication with Friends and Family: Not on file  . Frequency of Social Gatherings with Friends and Family: Not on file  . Attends Religious Services: Not on file  . Active Member of Clubs or Organizations: Not on file  . Attends Archivist Meetings: Not on file  . Marital Status: Not on file  Intimate Partner Violence:   . Fear of Current or Ex-Partner: Not on file  . Emotionally Abused: Not on file  . Physically Abused: Not on file  . Sexually Abused: Not on file   Family History:  Family History  Problem Relation Age of Onset  . Cancer Mother 16       lung   . Hypertension Mother   . Hypertension Father   . Heart attack Father   . Heart disease Father   . Rheum arthritis Sister   . Cancer Maternal Grandmother     Review of Systems: Constitutional: Denies fevers, chills or abnormal weight loss Eyes: Denies blurriness of vision Ears, nose, mouth, throat, and face: Denies mucositis or sore throat Respiratory: Denies cough, dyspnea or wheezes Cardiovascular: Denies palpitation, chest discomfort or lower extremity swelling Gastrointestinal:  Denies nausea, constipation, diarrhea GU: Denies dysuria or incontinence Skin: Denies abnormal skin rashes Neurological: Per HPI Musculoskeletal: Denies joint pain, back or neck discomfort. No decrease in ROM Behavioral/Psych: Denies anxiety, disturbance in thought content, and mood instability   Physical Exam: Vitals:   05/27/19 0927  BP: 140/89  Pulse: 75  Resp: 17  Temp: 98.7 F (37.1 C)  SpO2: 94%    KPS: 70. General: Alert, cooperative, pleasant, in no acute distress Head: Craniotomy scar noted, dry and intact. EENT: No conjunctival injection or scleral icterus. Oral mucosa moist Lungs: Resp effort normal Cardiac: Regular rate and rhythm Abdomen: Soft, non-distended abdomen Skin: No rashes cyanosis or petechiae. Extremities: No clubbing or  edema  Neurologic Exam: Mental Status: Awake, alert, attentive to examiner. Oriented to self and environment. Language is fluent with intact comprehension.  Age advanced psychomotor slowing.  Limited insight at times.  Cranial Nerves: Visual acuity is grossly normal. Visual fields are full. Extra-ocular movements intact. No ptosis. Face is symmetric, tongue midline. Motor: Tone and bulk are normal. Power is impaired to 4+/5 right leg, flexors>extendors. Subtle atrophy appreciated in left proximal hip girdle musculature. Reflexes are symmetric, no pathologic reflexes present. Intact finger to nose bilaterally Sensory: Intact to light touch and temperature Gait: Mildly hemiparetic   Labs: I have reviewed the data as listed    Component Value Date/Time   NA 138 05/26/2019 1320   NA 140 03/26/2017 0928   K 4.4 05/26/2019 1320   K 4.7 03/26/2017 0928   CL 102 05/26/2019 1320   CO2 24 05/26/2019 1320   CO2 21 (L) 03/26/2017 0928   GLUCOSE 105 (H) 05/26/2019 1320   GLUCOSE 95 03/26/2017 0928   BUN 42 (H) 05/26/2019 1320   BUN 19.3 03/26/2017 0928   CREATININE 1.98 (H) 05/26/2019 1320   CREATININE 1.77 (H) 05/24/2018 1445   CREATININE 1.2 03/26/2017 0928   CALCIUM 9.1 05/26/2019 1320   CALCIUM 8.7 03/26/2017 0928   PROT 6.4 (L) 05/26/2019 1320   PROT 6.8 03/26/2017 0928   ALBUMIN 3.4 (L) 05/26/2019 1320   ALBUMIN 3.7 03/26/2017 0928   AST 27 05/26/2019 1320   AST 18 05/24/2018 1445   AST 22 03/26/2017 0928   ALT 14 05/26/2019 1320   ALT 13 05/24/2018 1445   ALT 15 03/26/2017 0928  ALKPHOS 74 05/26/2019 1320   ALKPHOS 58 03/26/2017 0928   BILITOT 0.5 05/26/2019 1320   BILITOT 0.6 05/24/2018 1445   BILITOT 0.58 03/26/2017 0928   GFRNONAA 31 (L) 05/26/2019 1320   GFRNONAA 36 (L) 05/24/2018 1445   GFRAA 36 (L) 05/26/2019 1320   GFRAA 42 (L) 05/24/2018 1445   Lab Results  Component Value Date   WBC 4.3 05/26/2019   NEUTROABS 2.9 05/26/2019   HGB 12.2 (L) 05/26/2019   HCT  37.4 (L) 05/26/2019   MCV 103.0 (H) 05/26/2019   PLT 188 05/26/2019   Imaging:  [place] Clinician Interpretation: I have personally reviewed the CNS images as listed.  My interpretation, in the context of the patient's clinical presentation, is stable disease  CT Head W Wo Contrast  Result Date: 05/14/2019 CLINICAL DATA:  Follow-up metastatic means. EXAM: CT HEAD WITHOUT AND WITH CONTRAST TECHNIQUE: Contiguous axial images were obtained from the base of the skull through the vertex without and with intravenous contrast CONTRAST:  80m OMNIPAQUE IOHEXOL 300 MG/ML  SOLN COMPARISON:  02/14/2018 FINDINGS: Brain: Enhancing focus in the right cerebellum is no longer seen. Postoperative left frontal lobe with prominent volume loss and encephalomalacia. No abnormal enhancement throughout the brain. No acute or interval infarct, hemorrhage, hydrocephalus, or collection. Vascular: Major vessels are enhancing Skull: Unremarkable remote left frontal craniotomy. Negative for mass. Sinuses/Orbits: Negative IMPRESSION: No evidence of active metastatic disease. Stable post treatment brain. Electronically Signed   By: JMonte FantasiaM.D.   On: 05/14/2019 11:51    Assessment/Plan 1. Brain metastases ([place]  2. Cognitive Impairment  Mr. HFigueirais radiographically stable today.  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    Provided counseling today regarding effects of metabolic insults, such as chemotherapy, anemia, renal insufficiency, dehydration, and gout flares and their impact on cognitive function.  We recommend he return to clinic in 6 months with a CT head for evaluation.    He will continue to follow with Dr. FBurr Medicoand Dr. FGrayland Ormondlocally for systemic therapy.  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  We appreciate the opportunity to participate in the care of CBasilio Meadow.   All questions  were answered. The patient knows to call the clinic with any problems, questions or concerns. No barriers to learning were detected.  The total time spent in the encounter was 30 minutes minutes and more than 50% was on counseling and review of test results   ZVentura Sellers MD Medical Director of Neuro-Oncology [facility]02/16/21 4:09 PM

## 2019-05-28 ENCOUNTER — Telehealth: Payer: Self-pay | Admitting: Internal Medicine

## 2019-05-28 DIAGNOSIS — C44629 Squamous cell carcinoma of skin of left upper limb, including shoulder: Secondary | ICD-10-CM | POA: Diagnosis not present

## 2019-05-28 NOTE — Telephone Encounter (Signed)
Scheduled appt per 2/16 los.  Sent a message to HIM pool to get a calendar mailed out,.

## 2019-05-29 ENCOUNTER — Other Ambulatory Visit: Payer: Self-pay

## 2019-05-29 ENCOUNTER — Inpatient Hospital Stay: Payer: Medicare Other

## 2019-05-29 DIAGNOSIS — C9241 Acute promyelocytic leukemia, in remission: Secondary | ICD-10-CM | POA: Diagnosis not present

## 2019-05-29 DIAGNOSIS — C439 Malignant melanoma of skin, unspecified: Secondary | ICD-10-CM

## 2019-05-29 DIAGNOSIS — R5383 Other fatigue: Secondary | ICD-10-CM | POA: Diagnosis not present

## 2019-05-29 DIAGNOSIS — R531 Weakness: Secondary | ICD-10-CM | POA: Diagnosis not present

## 2019-05-29 MED ORDER — HEPARIN SOD (PORK) LOCK FLUSH 100 UNIT/ML IV SOLN
500.0000 [IU] | Freq: Once | INTRAVENOUS | Status: AC | PRN
  Administered 2019-05-29: 500 [IU]
  Filled 2019-05-29: qty 5

## 2019-05-29 MED ORDER — SODIUM CHLORIDE 0.9 % IV SOLN
Freq: Once | INTRAVENOUS | Status: AC
  Filled 2019-05-29: qty 250

## 2019-05-29 MED ORDER — SODIUM CHLORIDE 0.9% FLUSH
10.0000 mL | Freq: Once | INTRAVENOUS | Status: AC | PRN
  Administered 2019-05-29: 10 mL
  Filled 2019-05-29: qty 10

## 2019-05-30 ENCOUNTER — Other Ambulatory Visit: Payer: Self-pay

## 2019-05-30 ENCOUNTER — Other Ambulatory Visit: Payer: Self-pay | Admitting: *Deleted

## 2019-05-30 DIAGNOSIS — C439 Malignant melanoma of skin, unspecified: Secondary | ICD-10-CM

## 2019-05-30 DIAGNOSIS — C7931 Secondary malignant neoplasm of brain: Secondary | ICD-10-CM

## 2019-06-02 ENCOUNTER — Inpatient Hospital Stay: Payer: Medicare Other

## 2019-06-02 ENCOUNTER — Other Ambulatory Visit: Payer: Self-pay

## 2019-06-02 ENCOUNTER — Inpatient Hospital Stay: Payer: Medicare Other | Admitting: *Deleted

## 2019-06-02 VITALS — BP 131/88 | HR 75 | Temp 97.4°F | Resp 16 | Wt 197.4 lb

## 2019-06-02 DIAGNOSIS — C9241 Acute promyelocytic leukemia, in remission: Secondary | ICD-10-CM | POA: Diagnosis not present

## 2019-06-02 DIAGNOSIS — R531 Weakness: Secondary | ICD-10-CM | POA: Diagnosis not present

## 2019-06-02 DIAGNOSIS — Z95828 Presence of other vascular implants and grafts: Secondary | ICD-10-CM

## 2019-06-02 DIAGNOSIS — R5383 Other fatigue: Secondary | ICD-10-CM | POA: Diagnosis not present

## 2019-06-02 DIAGNOSIS — C439 Malignant melanoma of skin, unspecified: Secondary | ICD-10-CM

## 2019-06-02 LAB — COMPREHENSIVE METABOLIC PANEL
ALT: 13 U/L (ref 0–44)
AST: 24 U/L (ref 15–41)
Albumin: 3.5 g/dL (ref 3.5–5.0)
Alkaline Phosphatase: 61 U/L (ref 38–126)
Anion gap: 10 (ref 5–15)
BUN: 41 mg/dL — ABNORMAL HIGH (ref 8–23)
CO2: 24 mmol/L (ref 22–32)
Calcium: 9.3 mg/dL (ref 8.9–10.3)
Chloride: 105 mmol/L (ref 98–111)
Creatinine, Ser: 1.76 mg/dL — ABNORMAL HIGH (ref 0.61–1.24)
GFR calc Af Amer: 42 mL/min — ABNORMAL LOW (ref >60)
GFR calc non Af Amer: 36 mL/min — ABNORMAL LOW (ref >60)
Glucose, Bld: 143 mg/dL — ABNORMAL HIGH (ref 70–99)
Potassium: 4.2 mmol/L (ref 3.5–5.1)
Sodium: 139 mmol/L (ref 135–145)
Total Bilirubin: 0.5 mg/dL (ref 0.3–1.2)
Total Protein: 6.5 g/dL (ref 6.5–8.1)

## 2019-06-02 LAB — CBC WITH DIFFERENTIAL/PLATELET
Abs Immature Granulocytes: 0.05 10*3/uL (ref 0.00–0.07)
Basophils Absolute: 0 10*3/uL (ref 0.0–0.1)
Basophils Relative: 0 %
Eosinophils Absolute: 0.3 10*3/uL (ref 0.0–0.5)
Eosinophils Relative: 6 %
HCT: 39.7 % (ref 39.0–52.0)
Hemoglobin: 12.9 g/dL — ABNORMAL LOW (ref 13.0–17.0)
Immature Granulocytes: 1 %
Lymphocytes Relative: 11 %
Lymphs Abs: 0.6 10*3/uL — ABNORMAL LOW (ref 0.7–4.0)
MCH: 33.1 pg (ref 26.0–34.0)
MCHC: 32.5 g/dL (ref 30.0–36.0)
MCV: 101.8 fL — ABNORMAL HIGH (ref 80.0–100.0)
Monocytes Absolute: 0.7 10*3/uL (ref 0.1–1.0)
Monocytes Relative: 12 %
Neutro Abs: 3.6 10*3/uL (ref 1.7–7.7)
Neutrophils Relative %: 70 %
Platelets: 218 10*3/uL (ref 150–400)
RBC: 3.9 MIL/uL — ABNORMAL LOW (ref 4.22–5.81)
RDW: 15.9 % — ABNORMAL HIGH (ref 11.5–15.5)
WBC: 5.3 10*3/uL (ref 4.0–10.5)
nRBC: 0 % (ref 0.0–0.2)

## 2019-06-02 MED ORDER — HEPARIN SOD (PORK) LOCK FLUSH 100 UNIT/ML IV SOLN
500.0000 [IU] | Freq: Once | INTRAVENOUS | Status: AC | PRN
  Administered 2019-06-02: 500 [IU]
  Filled 2019-06-02: qty 5

## 2019-06-02 MED ORDER — SODIUM CHLORIDE 0.9% FLUSH
10.0000 mL | Freq: Once | INTRAVENOUS | Status: AC
  Administered 2019-06-02: 11:00:00 10 mL via INTRAVENOUS
  Filled 2019-06-02: qty 10

## 2019-06-02 MED ORDER — SODIUM CHLORIDE 0.9 % IV SOLN
Freq: Once | INTRAVENOUS | Status: AC
  Filled 2019-06-02: qty 250

## 2019-06-02 MED ORDER — HEPARIN SOD (PORK) LOCK FLUSH 100 UNIT/ML IV SOLN
INTRAVENOUS | Status: AC
  Filled 2019-06-02: qty 5

## 2019-06-04 ENCOUNTER — Other Ambulatory Visit: Payer: Self-pay

## 2019-06-05 ENCOUNTER — Inpatient Hospital Stay: Payer: Medicare Other

## 2019-06-05 ENCOUNTER — Inpatient Hospital Stay (HOSPITAL_BASED_OUTPATIENT_CLINIC_OR_DEPARTMENT_OTHER): Payer: Medicare Other | Admitting: Oncology

## 2019-06-05 ENCOUNTER — Other Ambulatory Visit: Payer: Self-pay | Admitting: Oncology

## 2019-06-05 ENCOUNTER — Other Ambulatory Visit: Payer: Self-pay

## 2019-06-05 VITALS — BP 131/88 | HR 76 | Temp 96.0°F | Resp 20

## 2019-06-05 DIAGNOSIS — R531 Weakness: Secondary | ICD-10-CM

## 2019-06-05 DIAGNOSIS — C9241 Acute promyelocytic leukemia, in remission: Secondary | ICD-10-CM | POA: Diagnosis not present

## 2019-06-05 DIAGNOSIS — R4189 Other symptoms and signs involving cognitive functions and awareness: Secondary | ICD-10-CM

## 2019-06-05 DIAGNOSIS — C439 Malignant melanoma of skin, unspecified: Secondary | ICD-10-CM

## 2019-06-05 DIAGNOSIS — R5383 Other fatigue: Secondary | ICD-10-CM | POA: Diagnosis not present

## 2019-06-05 LAB — CBC WITH DIFFERENTIAL/PLATELET
Abs Immature Granulocytes: 0.05 10*3/uL (ref 0.00–0.07)
Basophils Absolute: 0 10*3/uL (ref 0.0–0.1)
Basophils Relative: 0 %
Eosinophils Absolute: 0.3 10*3/uL (ref 0.0–0.5)
Eosinophils Relative: 5 %
HCT: 37.5 % — ABNORMAL LOW (ref 39.0–52.0)
Hemoglobin: 12.3 g/dL — ABNORMAL LOW (ref 13.0–17.0)
Immature Granulocytes: 1 %
Lymphocytes Relative: 9 %
Lymphs Abs: 0.5 10*3/uL — ABNORMAL LOW (ref 0.7–4.0)
MCH: 33.1 pg (ref 26.0–34.0)
MCHC: 32.8 g/dL (ref 30.0–36.0)
MCV: 100.8 fL — ABNORMAL HIGH (ref 80.0–100.0)
Monocytes Absolute: 0.7 10*3/uL (ref 0.1–1.0)
Monocytes Relative: 13 %
Neutro Abs: 4.2 10*3/uL (ref 1.7–7.7)
Neutrophils Relative %: 72 %
Platelets: 228 10*3/uL (ref 150–400)
RBC: 3.72 MIL/uL — ABNORMAL LOW (ref 4.22–5.81)
RDW: 15.8 % — ABNORMAL HIGH (ref 11.5–15.5)
WBC: 5.8 10*3/uL (ref 4.0–10.5)
nRBC: 0 % (ref 0.0–0.2)

## 2019-06-05 LAB — COMPREHENSIVE METABOLIC PANEL
ALT: 13 U/L (ref 0–44)
AST: 20 U/L (ref 15–41)
Albumin: 3.4 g/dL — ABNORMAL LOW (ref 3.5–5.0)
Alkaline Phosphatase: 55 U/L (ref 38–126)
Anion gap: 9 (ref 5–15)
BUN: 38 mg/dL — ABNORMAL HIGH (ref 8–23)
CO2: 26 mmol/L (ref 22–32)
Calcium: 9.3 mg/dL (ref 8.9–10.3)
Chloride: 103 mmol/L (ref 98–111)
Creatinine, Ser: 1.49 mg/dL — ABNORMAL HIGH (ref 0.61–1.24)
GFR calc Af Amer: 51 mL/min — ABNORMAL LOW (ref >60)
GFR calc non Af Amer: 44 mL/min — ABNORMAL LOW (ref >60)
Glucose, Bld: 96 mg/dL (ref 70–99)
Potassium: 4.5 mmol/L (ref 3.5–5.1)
Sodium: 138 mmol/L (ref 135–145)
Total Bilirubin: 0.6 mg/dL (ref 0.3–1.2)
Total Protein: 6.4 g/dL — ABNORMAL LOW (ref 6.5–8.1)

## 2019-06-05 LAB — MAGNESIUM: Magnesium: 2 mg/dL (ref 1.7–2.4)

## 2019-06-05 MED ORDER — HEPARIN SOD (PORK) LOCK FLUSH 100 UNIT/ML IV SOLN
INTRAVENOUS | Status: AC
  Filled 2019-06-05: qty 5

## 2019-06-05 MED ORDER — SODIUM CHLORIDE 0.9% FLUSH
10.0000 mL | INTRAVENOUS | Status: DC | PRN
  Administered 2019-06-05: 10 mL via INTRAVENOUS
  Filled 2019-06-05: qty 10

## 2019-06-05 MED ORDER — HEPARIN SOD (PORK) LOCK FLUSH 100 UNIT/ML IV SOLN
500.0000 [IU] | Freq: Once | INTRAVENOUS | Status: AC
  Administered 2019-06-05: 15:00:00 500 [IU] via INTRAVENOUS
  Filled 2019-06-05: qty 5

## 2019-06-05 MED ORDER — SODIUM CHLORIDE 0.9 % IV SOLN
Freq: Once | INTRAVENOUS | Status: AC
  Administered 2019-06-05: 1000 mL via INTRAVENOUS
  Filled 2019-06-05: qty 250

## 2019-06-05 NOTE — Progress Notes (Signed)
Symptom Management Consult note [facility]  [phone] Fax:([phone]) [phone]  Patient Care Team: [name], MD as PCP - General (Family Medicine) Minna Merritts, MD as PCP - Cardiology (Cardiology) Deboraha Sprang, MD as PCP - Electrophysiology (Cardiology) Cira Rue, RN Nurse Navigator as Registered Nurse (Medical Oncology) Acquanetta Chain, DO as Consulting Physician [facility] and Palliative Medicine) [name], MD as Consulting Physician (Hematology and Oncology)   Name of the patient: David [name]  [id number]  [birth year]   Date of visit: 06/05/2019   Diagnosis- Acute promyelocytic leukemia  Chief complaint/ Reason for visit- Falls  Heme/Onc history:  Oncology History Overview Note  Metastatic melanoma [facility])   Staging form: Melanoma of the Skin, AJCC 7th Edition     Clinical stage from 09/21/2015: Stage IV [place], [place], M1c) - Signed by [name], MD on 10/09/2015     Malignant melanoma ([place])  09/16/2015 Imaging   PET scan showed a hypermetabolic 6.5BX lingular nodule, and a 10m right upper lobe lung nodule, left subclavicular nodes, and bone metastasis in the proximal right humerus and right femur.    09/21/2015 Initial Diagnosis   Metastatic melanoma ([place]   09/21/2015 Initial Biopsy   Left subclavicular lymph node biopsy showed prostatic of melanoma   09/21/2015 Miscellaneous   BRAF [id number] mutation (+)    10/04/2015 Imaging   CT head with without contrast showed a 2.0 x 1.6 x 1.7 cm superficial left frontal hyperdense lesion, with postcontrast enhancement, lying within the previous hemorrhagic area, concerning for solitary melanoma metastasis   10/06/2015 - 09/06/2016 Chemotherapy   Nivolumab [id number] 2 weeks, held due to his dexamethasone for radionecrosis    10/13/2015 - 10/13/2015 Radiation Therapy   10/13/2015 Preop SRS treatment: 14 Gy  to the left frontal lesion in 1 fraction   10/15/2015 Surgery   left front  craniotomy and tumor excision    10/15/2015 Pathology Results   left frontal brain mass excision showed metastatic melanoma, 1.5X1.5X0.6cm   01/13/2016 Imaging   CT head without and with contrast showed ill-defined enhancement and dural thickening of the left frontal resection cavity may represent a post treatment changes or residual neoplasm. A new 6 mm hypodensity in the right cerebellar hemisphere with uncertainty enhancement may represent a metastasis or small brain parenchymal hemorrhage.    01/27/2016 - 01/27/2016 Radiation Therapy   01/27/16 SRS Treatment:  20 Gy in 1 fraction to a 6 mm right cerebellar brain met    02/2016 - 05/24/2018 Chemotherapy   Monthly Xgeva starting 02/2016, changed to every 3 months on 01/30/2018. Stopped on 05/24/2018   03/29/2016 PET scan   PET 03/29/2016 IMPRESSION: 1. Overall there has been an interval response to therapy. The index lesions sided on previous exam it are less hypermetabolic when compared with the previous exam. 2. There is an intramuscular lesion within the right masseter which has increased FDG uptake compared with previous exam. 3. No new sites of disease identified. 4. Aortic atherosclerosis and infrarenal abdominal aortic aneurysm. Recommend followup by ultrasound in 3 years. This recommendation follows ACR consensus guidelines: White Paper of the ACR Incidental Findings Committee II on Vascular Findings. J Joellyn Ruedadiol 2013; 10:789-794   04/20/2016 Imaging   CT Head w wo contrast 1. Mild patchy enhancement at the left anterior frontal lobe treatment site (series 11, image 39) without mass effect and stable surrounding white matter hypodensity without strong evidence of acute vasogenic edema. Recommend continued CT surveillance.  2. No new metastatic disease or new intracranial abnormality identified. 3. Probable benign 14 mm left parietal bone lucent lesion, unchanged since May 2017.   05/06/2016 - 05/08/2016 Hospital  Admission   Pt was admitted for rectal bleeding for one day. Breathing spontaneously resolved, hemoglobin dropped from 15 to 10.5, did not require blood transfusion. Colonoscopy showed no active bleeding, except diverticulosis and hemorrhoid. He was discharged to home.   05/08/2016 Procedure   Colonoscopy  - Stool in the ascending colon. Fluid aspiration performed. - Diverticulosis in the entire examined colon. - Non-bleeding internal hemorrhoids. - The examination was otherwise normal on direct and retroflexion views.   05/29/2016 Imaging   PET Image Restaging IMPRESSION: 1. Stable exam with no evidence progression. 2. Photopenia in the LEFT frontal lobe at prior surgical site. 3. Stable mildly hypermetabolic RIGHT paratracheal node. This may represent a thyroid nodule. 4. Stable size of minimally metabolic lingular pulmonary nodule. 5. Stable skeletal lesion of the RIGHT iliac crest 6. No cutaneous metabolic lesion.   07/20/2016 Imaging   CT Head W WO Contrast IMPRESSION: Pronounced change in the left frontal region. Much more regional vasogenic edema. Relatively stable appearance of the abnormal enhancement extending from the lateral margin of the frontal horn of left lateral ventricle to the frontal brain surface. New region of abnormal enhancement surrounding the frontal horn of the left lateral ventricle measuring approximately 3.2 cm. The differential diagnosis is recurrent tumor versus radiation necrosis. The patient is reportedly not an MRI candidate. This area is large enough that CT perfusion could possibly make a reasonable differentiation.   09/02/2016 Imaging   CT Head 09/02/16 IMPRESSION: Somewhat mixed pattern of slight posterior progression of pleomorphic enhancement surrounding the LEFT lateral ventricle although no significant mass effect, and reduced vasogenic edema compared with the scan in April.   09/27/2016 - 11/02/2016 Chemotherapy   Start dabrafenib and  trametinib 09/27/16, stopped due to severe fatigue     10/05/2016 Imaging   CT Head w & w/o contrast  IMPRESSION: 1. Decreased size of the enhancing area within the left frontal lobe treatment site. Surrounding edema has also decreased. The findings support continued evolution of radiation necrosis. 2. No new enhancing lesions.   10/18/2016 PET scan   IMPRESSION: 1. New focal hypermetabolism in the proximal sigmoid colon with associated focal colonic wall thickening and mild pericolonic fat stranding at the site of a proximal sigmoid diverticulum, favored to represent mild acute sigmoid diverticulitis. Metastatic disease is considered less likely. Clinical correlation is necessary. Consider appropriate antibiotic therapy, as clinically warranted. Attention to this region recommended on future PET-CT follow-up. 2. Otherwise complete metabolic response with no evidence of hypermetabolic metastatic disease. Lingular pulmonary nodule is slightly decreased in size and demonstrates no significant metabolism. 3. No residual hypermetabolism in the right thyroid lobe nodule, which is stable in size. 4. Aortic Atherosclerosis (ICD10-I70.0). Stable 3.1 cm infrarenal abdominal aortic aneurysm. Aortic aneurysm NOS (ICD10-I71.9). Recommend followup by ultrasound in 3 years. This recommendation follows ACR consensus guidelines: White Paper of the ACR Incidental Findings Committee II on Vascular Findings. Joellyn Rued Radiol 2013; 76:720-947.   11/01/2016 - 11/04/2016 Hospital Admission   Patient admitted to the hospital due to hypercalcemia where he received IV fluids and pamidronate   12/13/2016 PET scan   PET 12/13/16 IMPRESSION: 1. No findings to suggest metastatic disease on today's examination. 2. Previously noted hypermetabolism in the sigmoid colon has resolved, presumably indicative of a focus of diverticulitis on the prior study. Today's  study does demonstrate relatively  diffuse hypermetabolism throughout the cecum, ascending colon and proximal transverse colon where there is some mild mural thickening, favored to reflect mild chronic inflammation related to underlying diverticular disease. No definite inflammatory changes on the CT portion of the examination to strongly suggest an active acute diverticulitis at this time. No discrete lesion to suggest neoplasm. 3. Previously noted 8 mm lingular nodule is stable in size and known remains non hypermetabolic. This is nonspecific but reassuring. Continued attention on future followup studies is recommended. 4. Aortic atherosclerosis, in addition to left main and 3 vessel coronary artery disease. Status post median sternotomy for CABG including LIMA to the LAD. In addition, there is mild fusiform aneurysmal dilatation of the infrarenal abdominal aorta (3.1 cm in diameter) as well as aneurysmal dilatation of the left common iliac   12/18/2016 - 12/31/2017 Chemotherapy   Restarted nivolamab 215m every 2 weeks, with tapering dose dexamethasone     04/18/2017 Imaging   PET Scan IMPRESSION: 1. No hypermetabolic lesion to suggest active malignancy. 2. Stable 8 mm lingular nodule is not currently hypermetabolic but merit surveillance. Infrarenal abdominal aortic aneurysm 3.1 cm in diameter. Recommend followup by [facility] 3 years. 3. Other imaging findings of potential clinical significance: Postoperative findings in the left frontal lobe. Right renal cyst. Aortic Atherosclerosis (ICD10-I70.0). Osteoarthritis. Lumbar scoliosis. Left knee effusion. Colonic diverticulosis.    10/10/2017 PET scan   IMPRESSION: 1. Small focus of FDG accumulation in the subcutaneous fat of the right gluteal region, nonspecific. Attention on follow-up suggested. 2. Otherwise stable exam without new or progressive hypermetabolic disease in the interval since prior PET-CT. 3. Stable 8 mm nodule in the lingula. No evidence for hypermetabolism on  PET imaging. 4.  Aortic Atherosclerois (ICD10-170.0)    01/29/2018 PET scan   IMPRESSION: 1. Small focus of hypermetabolism in the lower left paratracheal neck, which appears to localize to the posterior left thyroid lobe, without discrete thyroid nodule on the CT images, decreased in metabolism since 10/10/2017 PET-CT. This finding is nonspecific and may represent a hypermetabolic thyroid nodule. Consider thyroid ultrasound correlation. 2. No new hypermetabolic findings suspicious for metastatic disease. Previously described low level metabolism in the subcutaneous right gluteal region has resolved, compatible with resolved inflammatory focus. No evidence of recurrent hypermetabolism within the stable lingular pulmonary nodule. No evidence of recurrent osseous hypermetabolism. 3. Stable infrarenal 3.4 cm Abdominal Aortic Aneurysm (ICD10-I71.9). Recommend follow-up aortic ultrasound in 3 years. This recommendation follows ACR consensus guidelines: White Paper of the ACR Incidental Findings Committee II on Vascular Findings. J Am Coll Radiol 2013;; 82:800-349 4.  Aortic Atherosclerosis (ICD10-I70.0).   09/18/2018 PET scan   PET 09/18/18  IMPRESSION: 1. No findings of active malignancy. The known small right cerebellar mass is not readily apparent on PET-CT but was shown on today's CT head. 2. 3.2 cm infrarenal abdominal aortic aneurysm. Recommend followup by [facility] 3 years. This recommendation follows ACR consensus guidelines: White Paper of the ACR Incidental Findings Committee II on Vascular Findings. J Am Coll Radiol 2013; 10:789-794. Aortic aneurysm NOS (ICD10-I71.9). 3. Other imaging findings of potential clinical significance: Left frontal lobe encephalomalacia underlying the craniotomy site. Chronic paranasal sinusitis. Aortic Atherosclerosis (ICD10-I70.0). Coronary atherosclerosis. Mild cardiomegaly. Colonic diverticulosis. Dextroconvex lumbar scoliosis. Small left knee  effusion with mild synovitis. Postoperative findings in the right foot.   09/18/2018 Imaging   CT head  IMPRESSION: 1. No evidence of new intracranial metastases or acute abnormality. 2. Unchanged 4 mm right cerebellar metastasis.  3. Stable post treatment changes in the left frontal lobe.   APML (acute promyelocytic leukemia) in remission ([place])  01/08/2019 Initial Diagnosis   APML (acute promyelocytic leukemia) in remission ([place])   04/14/2019 -  Chemotherapy   The patient had palonosetron (ALOXI) injection 0.25 mg, 0.25 mg, Intravenous,  Once, 1 of 4 cycles Administration: 0.25 mg (04/15/2019), 0.25 mg (04/17/2019), 0.25 mg (04/21/2019), 0.25 mg (04/23/2019), 0.25 mg (04/25/2019), 0.25 mg (04/28/2019), 0.25 mg (04/30/2019), 0.25 mg (05/02/2019), 0.25 mg (05/05/2019), 0.25 mg (05/07/2019), 0.25 mg (05/09/2019), 0.25 mg (04/18/2019) arsenic trioxide (TRISENOX) 13.3 mg in sodium chloride 0.9 % 250 mL chemo Infusion, 0.15 mg/kg = 13.3 mg (100 % of original dose 0.15 mg/kg), Intravenous,  Once, 1 of 4 cycles Dose modification: 0.15 mg/kg/day (original dose 0.15 mg/kg, Cycle 1, Reason: Other (see comments)), 0.15 mg/kg (original dose 0.15 mg/kg, Cycle 1, Reason: Other (see comments), Comment: change to mg/kg) Administration: 13.3 mg (04/21/2019), 13.3 mg (04/22/2019), 13.3 mg (04/23/2019), 13.3 mg (04/24/2019), 13.3 mg (04/25/2019), 13.3 mg (04/28/2019), 13.3 mg (04/29/2019), 13.3 mg (04/30/2019), 13.3 mg (05/01/2019), 13.3 mg (05/02/2019), 13.3 mg (05/05/2019), 13.3 mg (05/06/2019), 13.3 mg (05/07/2019), 13.3 mg (05/08/2019), 13.3 mg (05/09/2019)  for chemotherapy treatment.     Interval history-patient presents to clinic today for scheduled IV fluids.  Patient's daughter asked that he be seen due to recent fall.  Patient states he has become increasingly weak over the course of the past 10 days.  He has history of induction chemotherapy at [facility] which was completed in November 2020.  Blood work confirmed molecular  remission.  He is continuing maintenance chemo using ATRA 14 days on 14 days off with arsenic trioxide Monday through Friday for 4 weeks then 4 weeks off.  Plan is for 4 cycles of 32-week treatment.  Recently he was seen by Dr. Mickeal Skinner from [facility] where they discussed cognitive impairment secondary to acute on chronic encephalopathy from atrophy after brain surgery and radiation.  It was recommended he return in approximately 6 months for CT head for evaluation.  They discussed effects of metabolic insult such as chemotherapy, anemia, renal insufficiency, dehydration and gout flares that could potentially further impact his cognitive function.  Today, he states "I think I am dying".  He denies any pain from recent fall or injury.  He states it is just harder to get around.  He uses his walker pretty much all the time.  He denies any recent fevers or illness, chest pain, nausea, vomiting, constipation or diarrhea.  His appetite is fair and he eats when he wants to.  He can remember if he is drinking plenty of fluids.  ECOG FS:3 - Symptomatic, >50% confined to bed  Review of systems- Review of Systems  Constitutional: Positive for malaise/fatigue. Negative for chills, fever and weight loss.  HENT: Negative for congestion, ear pain and tinnitus.   Eyes: Negative.  Negative for blurred vision and double vision.  Respiratory: Negative.  Negative for cough, sputum production and shortness of breath.   Cardiovascular: Negative.  Negative for chest pain, palpitations and leg swelling.  Gastrointestinal: Negative.  Negative for abdominal pain, constipation, diarrhea, nausea and vomiting.  Genitourinary: Negative for dysuria, frequency and urgency.  Musculoskeletal: Positive for falls. Negative for back pain.  Skin: Negative.  Negative for rash.  Neurological: Positive for dizziness and weakness. Negative for headaches.  Endo/Heme/Allergies: Negative.  Does not bruise/bleed easily.    Psychiatric/Behavioral: Negative.  Negative for depression. The patient is not nervous/anxious  and does not have insomnia.      Current treatment- s/p ATRA-Aresenic trioxide M-F for 4 weeks on/4 weeks.   Allergies  Allergen Reactions  . Ciprofloxacin Other (See Comments)    Tendonitis  . Ezetimibe Other (See Comments)    Unknown reaction  . Simvastatin Other (See Comments)    Reaction:  Gave pt a fever  Fever - temp of 103.   In 2003     Past Medical History:  Diagnosis Date  . Anginal pain ([place])   . Arthritis    mostly hands  . Benign familial hematuria   . BPH (benign prostatic hypertrophy)   . Brain cancer ([place])    melanoma with brain met  . Coronary artery disease   . GERD (gastroesophageal reflux disease)   . High cholesterol   . Hypertension   . Hypothyroidism   . Intracerebral hemorrhage ([place])   . Melanoma ([place])   . Metastatic melanoma to lung ([place])   . Mood disorder ([place])   . Myocardial infarction ([place])   . Pacemaker    due to syncope, 3rd degree HB (upgrade to [facility]. Jude CRT-P 02/25/13 (Dr. Uvaldo Rising)  . Rectal bleed    due to NSAIDS  . Renal disorder   . Sepsis ([place]) 12/09/2018  . Skin cancer    melanoma  . Sleep apnea    uses CPAP  . Stroke [facility])      Past Surgical History:  Procedure Laterality Date  . APPLICATION OF CRANIAL NAVIGATION N/A 10/15/2015   Procedure: APPLICATION OF CRANIAL NAVIGATION;  Surgeon: Erline Levine, MD;  Location: [facility];  Service: Neurosurgery;  Laterality: N/A;  . BIV PACEMAKER GENERATOR CHANGEOUT N/A 04/02/2019   Procedure: BIV PACEMAKER GENERATOR CHANGEOUT;  Surgeon: Evans Lance, MD;  Location: [facility];  Service: Cardiovascular;  Laterality: N/A;  . CARDIAC CATHETERIZATION  2013  . CARDIAC SURGERY     bypass X 2  . COLONOSCOPY WITH PROPOFOL N/A 05/08/2016   diverticulosis, int hem, no f/u needed [name])  . CORONARY ARTERY BYPASS GRAFT  2013   LIMA-LAD, SVG-PDA 03/27/11 (Dr. Francee Gentile)  . CRANIOTOMY N/A  10/15/2015   Procedure: LEFT FRONTAL CRANIOTOMY TUMOR EXCISION with Curve;  Surgeon: Erline Levine, MD;  Location: [facility];  Service: Neurosurgery;  Laterality: N/A;  CRANIOTOMY TUMOR EXCISION  . EYE SURGERY    . FOOT SURGERY  08/2010  . JOINT REPLACEMENT Left    partial knee  . KNEE ARTHROSCOPY  12/2008  . LYMPH NODE BIOPSY    . PACEMAKER INSERTION    . TONSILLECTOMY      Social History   Socioeconomic History  . Marital status: Married    Spouse name: Not on file  . Number of children: 2  . Years of education: Not on file  . Highest education level: Not on file  Occupational History  . Occupation: retired Pharmacist, community  Tobacco Use  . Smoking status: Former Smoker    Packs/day: 1.00    Years: 3.00    Pack years: 3.00    Quit date: 04/10/1957    Years since quitting: 62.2  . Smokeless tobacco: Never Used  Substance and Sexual Activity  . Alcohol use: Yes    Alcohol/week: 14.0 standard drinks    Types: 10 Glasses of wine, 4 Cans of beer per week  . Drug use: No  . Sexual activity: Not Currently  Other Topics Concern  . Not on file  Social History Narrative   Lives  with wife [name]) with dementia in [place] children - daughter [name] and son    Occ: retired Pharmacist, community practiced in [place]   Has living will   [name] is health care [place]   Requests DNR-- done 09/14/15, declines tube feeds - verified 03/2019   Social Determinants of Health   Financial Resource Strain:   . Difficulty of Paying Living Expenses: Not on file  Food Insecurity:   . Worried About Charity fundraiser in the Last Year: Not on file  . Ran Out of Food in the Last Year: Not on file  Transportation Needs:   . Lack of Transportation (Medical): Not on file  . Lack of Transportation (Non-Medical): Not on file  Physical Activity:   . Days of Exercise per Week: Not on file  . Minutes of Exercise per Session: Not on file  Stress:   . Feeling of Stress : Not on file  Social  Connections:   . Frequency of Communication with Friends and Family: Not on file  . Frequency of Social Gatherings with Friends and Family: Not on file  . Attends Religious Services: Not on file  . Active Member of Clubs or Organizations: Not on file  . Attends Archivist Meetings: Not on file  . Marital Status: Not on file  Intimate Partner Violence:   . Fear of Current or Ex-Partner: Not on file  . Emotionally Abused: Not on file  . Physically Abused: Not on file  . Sexually Abused: Not on file    Family History  Problem Relation Age of Onset  . Cancer Mother 14       lung   . Hypertension Mother   . Hypertension Father   . Heart attack Father   . Heart disease Father   . Rheum arthritis Sister   . Cancer Maternal Grandmother      Current Outpatient Medications:  .  acetaminophen (TYLENOL) 325 MG tablet, Take 2 tablets (650 mg total) by mouth every 6 (six) hours as needed for mild pain (or Fever >/= 101)., Disp:  , Rfl:  .  acyclovir (ZOVIRAX) 400 MG tablet, Take 400 mg by mouth 2 (two) times daily., Disp: , Rfl:  .  allopurinol (ZYLOPRIM) 100 MG tablet, Take 2 tablets (200 mg total) by mouth daily., Disp: 180 tablet, Rfl: 1 .  aspirin EC 81 MG tablet, Take 81 mg by mouth every evening. , Disp: , Rfl:  .  Blood Glucose Monitoring Suppl (ONETOUCH VERIO) w/Device KIT, 1 kit by Does not apply route as directed. E11.65, Disp: 1 kit, Rfl: 0 .  carboxymethylcellulose (REFRESH TEARS) 0.5 % SOLN, Apply to eye., Disp: , Rfl:  .  Cholecalciferol (VITAMIN D3) 25 MCG (1000 UT) CAPS, Take 1 capsule (1,000 Units total) by mouth daily., Disp: 30 capsule, Rfl: 0 .  citalopram (CELEXA) 10 MG tablet, Take 1 tablet (10 mg total) by mouth daily., Disp: 90 tablet, Rfl: 1 .  colchicine 0.6 MG tablet, Take 1 tablet (0.6 mg total) by mouth daily as needed (gout flare). Take 2 tablets on the first day of flare, Disp: 30 tablet, Rfl: 0 .  glucose blood test strip, E11.65 Use as instructed to  check sugars three time daily and as needed, Disp: 100 each, Rfl: 12 .  HUMALOG KWIKPEN 100 UNIT/ML KwikPen, , Disp: , Rfl:  .  hydrocortisone cream 1 %, Apply topically., Disp: , Rfl:  .  insulin aspart (NOVOLOG FLEXPEN)  100 UNIT/ML FlexPen, Use three times daily with meals as needed per sliding scale: 2 units for every 50 units over 150 (2u for 150-199, 4u for 200-249, 6u for 250-299, etc), Disp: 3 mL, Rfl: 1 .  ketoconazole (NIZORAL) 2 % shampoo, Apply topically., Disp: , Rfl:  .  Lancets (ONETOUCH ULTRASOFT) lancets, E11.65 Use as instructed to check sugars three time daily and as needed, Disp: 100 each, Rfl: 12 .  levothyroxine (SYNTHROID) 137 MCG tablet, TAKE 1 TABLET BY MOUTH ONCE DAILY BEFORE BREAKFAST (Patient taking differently: Take 137 mcg by mouth daily before breakfast. ), Disp: 30 tablet, Rfl: 11 .  methylphenidate (RITALIN) 5 MG tablet, Take 1 tablet (5 mg total) by mouth 2 (two) times daily with breakfast and lunch., Disp: 60 tablet, Rfl: 0 .  metoprolol succinate (TOPROL-XL) 50 MG 24 hr tablet, TAKE 1 AND 1/2 TABLET (75 MG) BY MOUTH EVERY DAY WITH OR IMMEDIATELY FOLLOWING A MEAL *DO NOT CRUSH* (Patient taking differently: Take 50 mg by mouth daily. ), Disp: 50 tablet, Rfl: 11 .  mineral oil-hydrophilic petrolatum (AQUAPHOR) ointment, Apply topically., Disp: , Rfl:  .  ondansetron (ZOFRAN-ODT) 4 MG disintegrating tablet, Take 1 tablet (4 mg total) by mouth every 8 (eight) hours as needed for nausea or vomiting., Disp: 60 tablet, Rfl: 2 .  polyethylene glycol (MIRALAX / GLYCOLAX) 17 g packet, Take 17 g by mouth 2 (two) times daily. (Patient taking differently: Take 17 g by mouth daily. ), Disp: 14 each, Rfl: 0 .  senna (SENOKOT) 8.6 MG TABS tablet, Take 1 tablet (8.6 mg total) by mouth at bedtime., Disp: 120 tablet, Rfl: 0 .  senna-docusate (SENOKOT-S) 8.6-50 MG tablet, Take by mouth., Disp: , Rfl:  .  sodium chloride (ALTAMIST SPRAY) 0.65 % nasal spray, Place into the nose., Disp: ,  Rfl:  .  testosterone cypionate (DEPOTESTOSTERONE CYPIONATE) 200 MG/ML injection, Inject 100 mg into the muscle every 14 (fourteen) days., Disp: , Rfl:  .  traZODone (DESYREL) 50 MG tablet, Take 0.5-1 tablets (25-50 mg total) by mouth at bedtime. (Patient taking differently: Take 25 mg by mouth at bedtime as needed for sleep. ), Disp: 30 tablet, Rfl: 1 .  tretinoin (VESANOID) 10 MG capsule, , Disp: , Rfl:  .  Turmeric Curcumin 500 MG CAPS, Take by mouth., Disp: , Rfl:  No current facility-administered medications for this visit.  Facility-Administered Medications Ordered in Other Visits:  .  0.9 %  sodium chloride infusion, 1,000 mL, Intravenous, Once, [name], MD .  arsenic trioxide (TRISENOX) 13.3 mg in sodium chloride 0.9 % 250 mL chemo Infusion, 0.15 mg/kg/day (Treatment Plan Recorded), Intravenous, Daily, [name], MD, Stopped at 04/21/19 1404 .  arsenic trioxide (TRISENOX) 13.3 mg in sodium chloride 0.9 % 250 mL chemo Infusion, 0.15 mg/kg/day (Treatment Plan Recorded), Intravenous, Daily, [name], MD, Last Rate: 132 mL/hr at 05/09/19 1340, 13.3 mg at 05/09/19 1340 .  sodium chloride flush (NS) 0.9 % injection 10 mL, 10 mL, Intracatheter, PRN, [name], MD, 10 mL at 05/09/19 1318  Physical exam: There were no vitals filed for this visit. Physical Exam Constitutional:      Appearance: Normal appearance. He is ill-appearing.  HENT:     Head: Normocephalic and atraumatic.  Eyes:     Pupils: Pupils are equal, round, and reactive to light.  Cardiovascular:     Rate and Rhythm: Normal rate and regular rhythm.     Heart sounds: Normal heart sounds. No murmur.  Pulmonary:     Effort: Pulmonary effort is normal.     Breath sounds: Normal breath sounds. No wheezing.  Abdominal:     General: Bowel sounds are normal. There is no distension.     Palpations: Abdomen is soft.     Tenderness: There is no abdominal tenderness.  Musculoskeletal:        General:  Normal range of motion.     Cervical back: Normal range of motion.  Skin:    General: Skin is warm and dry.     Coloration: Skin is pale.     Findings: No rash.  Neurological:     Mental Status: He is alert and oriented to person, place, and time.  Psychiatric:        Judgment: Judgment normal.      CMP Latest Ref Rng & Units 06/09/2019  Glucose 70 - 99 mg/dL 184(H)  BUN 8 - 23 mg/dL 43(H)  Creatinine 0.61 - 1.24 mg/dL 1.82(H)  Sodium 135 - 145 mmol/L 140  Potassium 3.5 - 5.1 mmol/L 4.3  Chloride 98 - 111 mmol/L 107  CO2 22 - 32 mmol/L 21(L)  Calcium 8.9 - 10.3 mg/dL 9.4  Total Protein 6.5 - 8.1 g/dL 6.3(L)  Total Bilirubin 0.3 - 1.2 mg/dL 0.6  Alkaline Phos 38 - 126 U/L 51  AST 15 - 41 U/L 22  ALT 0 - 44 U/L 11   CBC Latest Ref Rng & Units 06/09/2019  WBC 4.0 - 10.5 K/uL 5.3  Hemoglobin 13.0 - 17.0 g/dL 12.8(L)  Hematocrit 39.0 - 52.0 % 39.7  Platelets 150 - 400 K/uL 221    No images are attached to the encounter.  CT Head W Wo Contrast  Result Date: 05/14/2019 CLINICAL DATA:  Follow-up metastatic means. EXAM: CT HEAD WITHOUT AND WITH CONTRAST TECHNIQUE: Contiguous axial images were obtained from the base of the skull through the vertex without and with intravenous contrast CONTRAST:  82m OMNIPAQUE IOHEXOL 300 MG/ML  SOLN COMPARISON:  02/14/2018 FINDINGS: Brain: Enhancing focus in the right cerebellum is no longer seen. Postoperative left frontal lobe with prominent volume loss and encephalomalacia. No abnormal enhancement throughout the brain. No acute or interval infarct, hemorrhage, hydrocephalus, or collection. Vascular: Major vessels are enhancing Skull: Unremarkable remote left frontal craniotomy. Negative for mass. Sinuses/Orbits: Negative IMPRESSION: No evidence of active metastatic disease. Stable post treatment brain. Electronically Signed   By: JMonte FantasiaM.D.   On: 05/14/2019 11:51     Assessment and plan- Patient is a 79y.o. male who presented to the cancer  center for routine IV fluids.  He is scheduled to begin additional cycles of arsenic trioxide next week.   Increase generalized weakness exacerbated by dehydration and treatment.  He is receiving IV fluids today in clinic.  We will check labs to see if he has any electrolyte abnormalities.  Lab work appears stable.  Vital signs are stable.  He is not orthostatic.  This is my first time meeting this patient so I am unclear if his cognitive status is baseline.  He did mention that he thought he was dying and he was okay with that.  I will have him speak with Josh Borders on Monday when he returns for treatment.  Until then, I recommended he drink plenty of fluids and use his walker to prevent further falls.  He denies any pain at this time.  He denies injury.  Possibly consider delaying treatment secondary to declining performance status.  He can discuss  this with Dr. Grayland Ormond next week.  Plan: Proceed with IV fluids today. Add on lab work. Orthostatics-negative Add on visit with Josh Borders on Monday.   Disposition: RTC as scheduled on 06/09/2019 for MD assessment, labs and next cycle of chemo.     Visit Diagnosis 1. Weakness   2. APML (acute promyelocytic leukemia) in remission ([place])   3. Cognitive impairment     Patient expressed understanding and was in agreement with this plan. He also understands that He can call clinic at any time with any questions, concerns, or complaints.   Greater than 50% was spent in counseling and coordination of care with this patient including but not limited to discussion of the relevant topics above (See A&P) including, but not limited to diagnosis and management of acute and chronic medical conditions.   Thank you for allowing me to participate in the care of this very pleasant patient.    Jacquelin Hawking, NP [place] at [facility] Cell - [phone] Pager- [phone] 06/09/2019 3:25 PM

## 2019-06-06 ENCOUNTER — Other Ambulatory Visit: Payer: Self-pay

## 2019-06-06 ENCOUNTER — Encounter: Payer: Self-pay | Admitting: Oncology

## 2019-06-06 NOTE — Progress Notes (Signed)
Pre screening completed with daughter.

## 2019-06-07 NOTE — Progress Notes (Signed)
[place]  [phone] [phone]  ID: David [name]. OB: 04/18/1940  MR#: [id number]  BTD#:[id number]  Patient Care Team: [name], MD as PCP - General (Family Medicine) Cira Rue, RN Nurse Navigator as Registered Nurse (Medical Oncology) Acquanetta Chain, DO as Consulting Physician [facility] and Palliative Medicine) [name], MD as Consulting Physician (Hematology and Oncology)  CHIEF COMPLAINT: Acute promyelocytic leukemia in remission  INTERVAL HISTORY: Patient returns to clinic today for further evaluation and consideration of cycle 2 of arsenic and ATRA.  He is having more difficulty with memory.  He has chronic weakness and fatigue and is not happy with his current quality of life.  He has no neurologic complaints.  He has a good appetite and denies weight loss.  He denies any recent fevers or illnesses. He denies any chest pain, shortness of breath, cough, or hemoptysis.  He denies any nausea, vomiting, constipation, or diarrhea.  He has no urinary complaints.  Patient offers no further specific complaints today.  REVIEW OF SYSTEMS:   Review of Systems  Constitutional: Positive for malaise/fatigue. Negative for fever and weight loss.  Respiratory: Negative.  Negative for cough, hemoptysis and shortness of breath.   Cardiovascular: Negative.  Negative for chest pain and leg swelling.  Gastrointestinal: Negative.  Negative for abdominal pain and nausea.  Genitourinary: Negative.  Negative for hematuria.  Musculoskeletal: Negative.  Negative for back pain.  Skin: Negative.  Negative for rash.  Neurological: Positive for weakness. Negative for dizziness, focal weakness and headaches.  Psychiatric/Behavioral: Negative.  The patient is not nervous/anxious.     As per HPI. Otherwise, a complete review of systems is negative.  PAST MEDICAL HISTORY: Past Medical History:  Diagnosis Date  . Anginal pain ([place])     . Arthritis    mostly hands  . Benign familial hematuria   . BPH (benign prostatic hypertrophy)   . Brain cancer ([place])    melanoma with brain met  . Coronary artery disease   . GERD (gastroesophageal reflux disease)   . High cholesterol   . Hypertension   . Hypothyroidism   . Intracerebral hemorrhage ([place])   . Melanoma ([place])   . Metastatic melanoma to lung ([place])   . Mood disorder ([place])   . Myocardial infarction ([place])   . Pacemaker    due to syncope, 3rd degree HB (upgrade to [facility]. Jude CRT-P 02/25/13 (Dr. Uvaldo Rising)  . Rectal bleed    due to NSAIDS  . Renal disorder   . Sepsis ([place]) 12/09/2018  . Skin cancer    melanoma  . Sleep apnea    uses CPAP  . Stroke [facility])     PAST SURGICAL HISTORY: Past Surgical History:  Procedure Laterality Date  . APPLICATION OF CRANIAL NAVIGATION N/A 10/15/2015   Procedure: APPLICATION OF CRANIAL NAVIGATION;  Surgeon: Erline Levine, MD;  Location: [place] NEURO ORS;  Service: Neurosurgery;  Laterality: N/A;  . BIV PACEMAKER GENERATOR CHANGEOUT N/A 04/02/2019   Procedure: BIV PACEMAKER GENERATOR CHANGEOUT;  Surgeon: Evans Lance, MD;  Location: [place] CV LAB;  Service: Cardiovascular;  Laterality: N/A;  . CARDIAC CATHETERIZATION  2013  . CARDIAC SURGERY     bypass X 2  . COLONOSCOPY WITH PROPOFOL N/A 05/08/2016   diverticulosis, int hem, no f/u needed [name])  . CORONARY ARTERY BYPASS GRAFT  2013   LIMA-LAD, SVG-PDA 03/27/11 (Dr. Francee Gentile)  . CRANIOTOMY N/A 10/15/2015   Procedure: LEFT FRONTAL CRANIOTOMY TUMOR EXCISION with Curve;  Surgeon: Erline Levine, MD;  Location: [place] NEURO ORS;  Service: Neurosurgery;  Laterality: N/A;  CRANIOTOMY TUMOR EXCISION  . EYE SURGERY    . FOOT SURGERY  08/2010  . JOINT REPLACEMENT Left    partial knee  . KNEE ARTHROSCOPY  12/2008  . LYMPH NODE BIOPSY    . PACEMAKER INSERTION    . TONSILLECTOMY      FAMILY HISTORY: Family History  Problem Relation Age of Onset  . Cancer Mother 24       lung   . Hypertension  Mother   . Hypertension Father   . Heart attack Father   . Heart disease Father   . Rheum arthritis Sister   . Cancer Maternal Grandmother     ADVANCED DIRECTIVES (Y/N):  N  HEALTH MAINTENANCE: Social History   Tobacco Use  . Smoking status: Former Smoker    Packs/day: 1.00    Years: 3.00    Pack years: 3.00    Quit date: 04/10/1957    Years since quitting: 62.2  . Smokeless tobacco: Never Used  Substance Use Topics  . Alcohol use: Yes    Alcohol/week: 14.0 standard drinks    Types: 10 Glasses of wine, 4 Cans of beer per week  . Drug use: No     Colonoscopy:  PAP:  Bone density:  Lipid panel:  Allergies  Allergen Reactions  . Ciprofloxacin Other (See Comments)    Tendonitis  . Ezetimibe Other (See Comments)    Unknown reaction  . Simvastatin Other (See Comments)    Reaction:  Gave pt a fever  Fever - temp of 103.   In 2003    Current Outpatient Medications  Medication Sig Dispense Refill  . acetaminophen (TYLENOL) 325 MG tablet Take 2 tablets (650 mg total) by mouth every 6 (six) hours as needed for mild pain (or Fever >/= 101).    Marland Kitchen acyclovir (ZOVIRAX) 400 MG tablet Take 400 mg by mouth 2 (two) times daily.    Marland Kitchen allopurinol (ZYLOPRIM) 100 MG tablet Take 2 tablets (200 mg total) by mouth daily. 180 tablet 1  . aspirin EC 81 MG tablet Take 81 mg by mouth every evening.     . Blood Glucose Monitoring Suppl (ONETOUCH VERIO) w/Device KIT 1 kit by Does not apply route as directed. E11.65 1 kit 0  . carboxymethylcellulose (REFRESH TEARS) 0.5 % SOLN Apply to eye.    . Cholecalciferol (VITAMIN D3) 25 MCG (1000 UT) CAPS Take 1 capsule (1,000 Units total) by mouth daily. 30 capsule 0  . citalopram (CELEXA) 10 MG tablet Take 1 tablet (10 mg total) by mouth daily. 90 tablet 1  . colchicine 0.6 MG tablet Take 1 tablet (0.6 mg total) by mouth daily as needed (gout flare). Take 2 tablets on the first day of flare 30 tablet 0  . glucose blood test strip E11.65 Use as instructed  to check sugars three time daily and as needed 100 each 12  . HUMALOG KWIKPEN 100 UNIT/ML KwikPen     . hydrocortisone cream 1 % Apply topically.    . insulin aspart (NOVOLOG FLEXPEN) 100 UNIT/ML FlexPen Use three times daily with meals as needed per sliding scale: 2 units for every 50 units over 150 (2u for 150-199, 4u for 200-249, 6u for 250-299, etc) 3 mL 1  . ketoconazole (NIZORAL) 2 % shampoo Apply topically.    . Lancets (ONETOUCH ULTRASOFT) lancets E11.65 Use as instructed to check sugars three time daily and as needed  100 each 12  . levothyroxine (SYNTHROID) 137 MCG tablet TAKE 1 TABLET BY MOUTH ONCE DAILY BEFORE BREAKFAST (Patient taking differently: Take 137 mcg by mouth daily before breakfast. ) 30 tablet 11  . methylphenidate (RITALIN) 5 MG tablet Take 1 tablet (5 mg total) by mouth 2 (two) times daily with breakfast and lunch. 60 tablet 0  . metoprolol succinate (TOPROL-XL) 50 MG 24 hr tablet TAKE 1 AND 1/2 TABLET (75 MG) BY MOUTH EVERY DAY WITH OR IMMEDIATELY FOLLOWING A MEAL *DO NOT CRUSH* (Patient taking differently: Take 50 mg by mouth daily. ) 50 tablet 11  . mineral oil-hydrophilic petrolatum (AQUAPHOR) ointment Apply topically.    . ondansetron (ZOFRAN-ODT) 4 MG disintegrating tablet Take 1 tablet (4 mg total) by mouth every 8 (eight) hours as needed for nausea or vomiting. 60 tablet 2  . polyethylene glycol (MIRALAX / GLYCOLAX) 17 g packet Take 17 g by mouth 2 (two) times daily. (Patient taking differently: Take 17 g by mouth daily. ) 14 each 0  . senna (SENOKOT) 8.6 MG TABS tablet Take 1 tablet (8.6 mg total) by mouth at bedtime. 120 tablet 0  . senna-docusate (SENOKOT-S) 8.6-50 MG tablet Take by mouth.    . sodium chloride (ALTAMIST SPRAY) 0.65 % nasal spray Place into the nose.    . testosterone cypionate (DEPOTESTOSTERONE CYPIONATE) 200 MG/ML injection Inject 100 mg into the muscle every 14 (fourteen) days.    . traZODone (DESYREL) 50 MG tablet Take 0.5-1 tablets (25-50 mg  total) by mouth at bedtime. (Patient taking differently: Take 25 mg by mouth at bedtime as needed for sleep. ) 30 tablet 1  . tretinoin (VESANOID) 10 MG capsule     . Turmeric Curcumin 500 MG CAPS Take by mouth.     No current facility-administered medications for this visit.   Facility-Administered Medications Ordered in Other Visits  Medication Dose Route Frequency Provider Last Rate Last Admin  . 0.9 %  sodium chloride infusion  1,000 mL Intravenous Once [name], MD      . arsenic trioxide (TRISENOX) 13.3 mg in sodium chloride 0.9 % 250 mL chemo Infusion  0.15 mg/kg/day (Treatment Plan Recorded) Intravenous Daily [name], MD   Stopped at 04/21/19 1404  . arsenic trioxide (TRISENOX) 13.3 mg in sodium chloride 0.9 % 250 mL chemo Infusion  0.15 mg/kg/day (Treatment Plan Recorded) Intravenous Daily [name], MD 132 mL/hr at 05/09/19 1340 13.3 mg at 05/09/19 1340  . sodium chloride flush (NS) 0.9 % injection 10 mL  10 mL Intracatheter PRN [name], MD   10 mL at 05/09/19 1318    OBJECTIVE: Vitals:   06/09/19 0948  BP: 125/80  Pulse: 76  Resp: 16  Temp: (!) 96 F (35.6 C)  SpO2: 96%     Body mass index is 28.67 kg/m.    ECOG FS:2 - Symptomatic, <50% confined to bed  General: Thin, no acute distress.  Sitting in a wheelchair. Eyes: Pink conjunctiva, anicteric sclera. HEENT: Normocephalic, moist mucous membranes. Lungs: No audible wheezing or coughing. Heart: Regular rate and rhythm. Abdomen: Soft, nontender, no obvious distention. Musculoskeletal: No edema, cyanosis, or clubbing. Neuro: Alert, answering all questions appropriately. Cranial nerves grossly intact. Skin: No rashes or petechiae noted. Psych: Normal affect.   LAB RESULTS:  Lab Results  Component Value Date   NA 140 06/09/2019   K 4.3 06/09/2019   CL 107 06/09/2019   CO2 21 (L) 06/09/2019   GLUCOSE 184 (H) 06/09/2019  BUN 43 (H) 06/09/2019   CREATININE 1.82 (H) 06/09/2019    CALCIUM 9.4 06/09/2019   PROT 6.3 (L) 06/09/2019   ALBUMIN 3.5 06/09/2019   AST 22 06/09/2019   ALT 11 06/09/2019   ALKPHOS 51 06/09/2019   BILITOT 0.6 06/09/2019   GFRNONAA 35 (L) 06/09/2019   GFRAA 40 (L) 06/09/2019    Lab Results  Component Value Date   WBC 5.3 06/09/2019   NEUTROABS 3.9 06/09/2019   HGB 12.8 (L) 06/09/2019   HCT 39.7 06/09/2019   MCV 102.3 (H) 06/09/2019   PLT 221 06/09/2019     STUDIES: CT Head W Wo Contrast  Result Date: 05/14/2019 CLINICAL DATA:  Follow-up metastatic means. EXAM: CT HEAD WITHOUT AND WITH CONTRAST TECHNIQUE: Contiguous axial images were obtained from the base of the skull through the vertex without and with intravenous contrast CONTRAST:  31m OMNIPAQUE IOHEXOL 300 MG/ML  SOLN COMPARISON:  02/14/2018 FINDINGS: Brain: Enhancing focus in the right cerebellum is no longer seen. Postoperative left frontal lobe with prominent volume loss and encephalomalacia. No abnormal enhancement throughout the brain. No acute or interval infarct, hemorrhage, hydrocephalus, or collection. Vascular: Major vessels are enhancing Skull: Unremarkable remote left frontal craniotomy. Negative for mass. Sinuses/Orbits: Negative IMPRESSION: No evidence of active metastatic disease. Stable post treatment brain. Electronically Signed   By: JMonte FantasiaM.D.   On: 05/14/2019 11:51    ASSESSMENT: Acute promyelocytic leukemia in remission  PLAN:    1. Acute promyelocytic leukemia in remission: [facility].  Peripheral blood FISH-PML t(15:17) noted complete molecular remission.  Patient is not happy with his current quality of life and expressed interest in possibly discontinuing maintenance treatment altogether.  He did ultimately agreed to delay treatment 1 week to further discussed treatment plans.  His maintenance chemotherapy consist of  ATRA 14 days on 14 days off along with arsenic  trioxide at 0.15 mg/kg/day Monday through Friday for 4 weeks then 4 weeks off.  Each cycle will be a total of 8 weeks.  Initial plan was to do a total of 4 cycles or 32 weeks of treatment. He will require an EKG weekly throughout his 4 weeks of arsenic.  Patient will also continue to return to clinic every Monday and Thursday throughout treatment for consideration of IV fluids.   Delay cycle 2, day 1 of arsenic and ATRA for 1 week.  Patient will return to clinic for further evaluation and discussion on whether or not to continue treatment altogether.   2.  Nausea: Patient does not complain of this today.  Continue ondansetron as needed. 3.  Rash: Continue topical treatments as directed.  Can also consider prednisone taper if necessary. 4.  Cardiac disease/biventricular pacer: Given patient's biventricular pacing, he has a significantly abnormal EKG with a prolonged QTC of approximately 560.  Although treatment should be discontinued with a QTC greater than 500, this is patient's baseline and will continue to monitor with weekly EKGs.  Continue close follow-up with cardiology as scheduled.  5.  Melanoma: No obvious evidence of recurrence.  Monitor. 6.  Prostate cancer: Patient's most recent PSA in June 2020 was reported at 4.35. 7.  Weakness and fatigue: Likely multifactorial.  Proceed with IV fluids today and again later this week.  8.  Renal insufficiency: Chronic and unchanged.  Patient's creatinine is 1.82 today.  IV fluids as above. 9.  Leukopenia: Resolved. 10.  Confusion: Patient cannot get an MRI given his  biventricular pacer, but CT scan on May 14, 2019 revealed no evidence of recurrent or metastatic disease.  Continue follow-up with neuro oncology as scheduled.    Patient expressed understanding and was in agreement with this plan. He also understands that He can call clinic at any time with any questions, concerns, or complaints.     [name], MD   06/09/2019 12:36 PM

## 2019-06-09 ENCOUNTER — Other Ambulatory Visit: Payer: Self-pay

## 2019-06-09 ENCOUNTER — Encounter: Payer: Self-pay | Admitting: Oncology

## 2019-06-09 ENCOUNTER — Inpatient Hospital Stay (HOSPITAL_BASED_OUTPATIENT_CLINIC_OR_DEPARTMENT_OTHER): Payer: Medicare Other | Admitting: Oncology

## 2019-06-09 ENCOUNTER — Inpatient Hospital Stay (HOSPITAL_BASED_OUTPATIENT_CLINIC_OR_DEPARTMENT_OTHER): Payer: Medicare Other | Admitting: Hospice and Palliative Medicine

## 2019-06-09 ENCOUNTER — Inpatient Hospital Stay: Payer: Medicare Other

## 2019-06-09 ENCOUNTER — Inpatient Hospital Stay: Payer: Medicare Other | Attending: Oncology

## 2019-06-09 VITALS — BP 125/80 | HR 76 | Temp 96.0°F | Resp 16 | Wt 197.0 lb

## 2019-06-09 DIAGNOSIS — R410 Disorientation, unspecified: Secondary | ICD-10-CM | POA: Diagnosis not present

## 2019-06-09 DIAGNOSIS — Z85820 Personal history of malignant melanoma of skin: Secondary | ICD-10-CM | POA: Insufficient documentation

## 2019-06-09 DIAGNOSIS — Z950 Presence of cardiac pacemaker: Secondary | ICD-10-CM | POA: Insufficient documentation

## 2019-06-09 DIAGNOSIS — Z87891 Personal history of nicotine dependence: Secondary | ICD-10-CM | POA: Diagnosis not present

## 2019-06-09 DIAGNOSIS — C439 Malignant melanoma of skin, unspecified: Secondary | ICD-10-CM

## 2019-06-09 DIAGNOSIS — Z8546 Personal history of malignant neoplasm of prostate: Secondary | ICD-10-CM | POA: Diagnosis not present

## 2019-06-09 DIAGNOSIS — G4733 Obstructive sleep apnea (adult) (pediatric): Secondary | ICD-10-CM | POA: Diagnosis not present

## 2019-06-09 DIAGNOSIS — C9241 Acute promyelocytic leukemia, in remission: Secondary | ICD-10-CM

## 2019-06-09 DIAGNOSIS — Z8673 Personal history of transient ischemic attack (TIA), and cerebral infarction without residual deficits: Secondary | ICD-10-CM | POA: Diagnosis not present

## 2019-06-09 DIAGNOSIS — Z515 Encounter for palliative care: Secondary | ICD-10-CM

## 2019-06-09 DIAGNOSIS — R5383 Other fatigue: Secondary | ICD-10-CM | POA: Insufficient documentation

## 2019-06-09 DIAGNOSIS — Z7289 Other problems related to lifestyle: Secondary | ICD-10-CM | POA: Insufficient documentation

## 2019-06-09 DIAGNOSIS — R531 Weakness: Secondary | ICD-10-CM | POA: Insufficient documentation

## 2019-06-09 DIAGNOSIS — N189 Chronic kidney disease, unspecified: Secondary | ICD-10-CM | POA: Diagnosis not present

## 2019-06-09 LAB — CBC WITH DIFFERENTIAL/PLATELET
Abs Immature Granulocytes: 0.02 10*3/uL (ref 0.00–0.07)
Basophils Absolute: 0 10*3/uL (ref 0.0–0.1)
Basophils Relative: 1 %
Eosinophils Absolute: 0.3 10*3/uL (ref 0.0–0.5)
Eosinophils Relative: 7 %
HCT: 39.7 % (ref 39.0–52.0)
Hemoglobin: 12.8 g/dL — ABNORMAL LOW (ref 13.0–17.0)
Immature Granulocytes: 0 %
Lymphocytes Relative: 10 %
Lymphs Abs: 0.5 10*3/uL — ABNORMAL LOW (ref 0.7–4.0)
MCH: 33 pg (ref 26.0–34.0)
MCHC: 32.2 g/dL (ref 30.0–36.0)
MCV: 102.3 fL — ABNORMAL HIGH (ref 80.0–100.0)
Monocytes Absolute: 0.5 10*3/uL (ref 0.1–1.0)
Monocytes Relative: 9 %
Neutro Abs: 3.9 10*3/uL (ref 1.7–7.7)
Neutrophils Relative %: 73 %
Platelets: 221 10*3/uL (ref 150–400)
RBC: 3.88 MIL/uL — ABNORMAL LOW (ref 4.22–5.81)
RDW: 15.8 % — ABNORMAL HIGH (ref 11.5–15.5)
WBC: 5.3 10*3/uL (ref 4.0–10.5)
nRBC: 0 % (ref 0.0–0.2)

## 2019-06-09 LAB — COMPREHENSIVE METABOLIC PANEL
ALT: 11 U/L (ref 0–44)
AST: 22 U/L (ref 15–41)
Albumin: 3.5 g/dL (ref 3.5–5.0)
Alkaline Phosphatase: 51 U/L (ref 38–126)
Anion gap: 12 (ref 5–15)
BUN: 43 mg/dL — ABNORMAL HIGH (ref 8–23)
CO2: 21 mmol/L — ABNORMAL LOW (ref 22–32)
Calcium: 9.4 mg/dL (ref 8.9–10.3)
Chloride: 107 mmol/L (ref 98–111)
Creatinine, Ser: 1.82 mg/dL — ABNORMAL HIGH (ref 0.61–1.24)
GFR calc Af Amer: 40 mL/min — ABNORMAL LOW (ref >60)
GFR calc non Af Amer: 35 mL/min — ABNORMAL LOW (ref >60)
Glucose, Bld: 184 mg/dL — ABNORMAL HIGH (ref 70–99)
Potassium: 4.3 mmol/L (ref 3.5–5.1)
Sodium: 140 mmol/L (ref 135–145)
Total Bilirubin: 0.6 mg/dL (ref 0.3–1.2)
Total Protein: 6.3 g/dL — ABNORMAL LOW (ref 6.5–8.1)

## 2019-06-09 MED ORDER — HEPARIN SOD (PORK) LOCK FLUSH 100 UNIT/ML IV SOLN
500.0000 [IU] | Freq: Once | INTRAVENOUS | Status: DC | PRN
  Filled 2019-06-09: qty 5

## 2019-06-09 MED ORDER — ESCITALOPRAM OXALATE 10 MG PO TABS: 10.0000 mg | ORAL_TABLET | Freq: Every day | ORAL | 2 refills | Status: DC

## 2019-06-09 MED ORDER — HEPARIN SOD (PORK) LOCK FLUSH 100 UNIT/ML IV SOLN
500.0000 [IU] | Freq: Once | INTRAVENOUS | Status: AC
  Administered 2019-06-09: 500 [IU] via INTRAVENOUS
  Filled 2019-06-09: qty 5

## 2019-06-09 MED ORDER — SODIUM CHLORIDE 0.9% FLUSH
10.0000 mL | Freq: Once | INTRAVENOUS | Status: AC
  Administered 2019-06-09: 10 mL via INTRAVENOUS
  Filled 2019-06-09: qty 10

## 2019-06-09 MED ORDER — SODIUM CHLORIDE 0.9 % IV SOLN
Freq: Once | INTRAVENOUS | Status: AC
  Filled 2019-06-09: qty 250

## 2019-06-09 NOTE — Addendum Note (Signed)
Addended by: Irean Hong on: 06/09/2019 04:33 PM   Modules accepted: Orders

## 2019-06-09 NOTE — Progress Notes (Signed)
Patient states he feels like his health is "back sliding" in the last couple of weeks with worsening memory and weakness.

## 2019-06-09 NOTE — Progress Notes (Signed)
Per Duwayne Heck CMA per Dr. Grayland Ormond, pt to receive IVFs only today, no treatment at this time.

## 2019-06-09 NOTE — Progress Notes (Deleted)
Cardiology Office Note    Date:  06/09/2019   ID:  David Stall., DOB 1940-04-29, MRN [id number]  PCP:  [name], MD  Cardiologist:  Ida Rogue, MD  Electrophysiologist:  Virl Axe, MD   Chief Complaint: Follow up  History of Present Illness:   David [name]. is a 79 y.o. male with history of ***  ***   Labs independently reviewed: ***  Past Medical History:  Diagnosis Date  . Anginal pain ([place])   . Arthritis    mostly hands  . Benign familial hematuria   . BPH (benign prostatic hypertrophy)   . Brain cancer ([place])    melanoma with brain met  . Coronary artery disease   . GERD (gastroesophageal reflux disease)   . High cholesterol   . Hypertension   . Hypothyroidism   . Intracerebral hemorrhage ([place])   . Melanoma ([place])   . Metastatic melanoma to lung ([place])   . Mood disorder ([place])   . Myocardial infarction ([place])   . Pacemaker    due to syncope, 3rd degree HB (upgrade to [facility]. Jude CRT-P 02/25/13 (Dr. Uvaldo Rising)  . Rectal bleed    due to NSAIDS  . Renal disorder   . Sepsis ([place]) 12/09/2018  . Skin cancer    melanoma  . Sleep apnea    uses CPAP  . Stroke [facility])     Past Surgical History:  Procedure Laterality Date  . APPLICATION OF CRANIAL NAVIGATION N/A 10/15/2015   Procedure: APPLICATION OF CRANIAL NAVIGATION;  Surgeon: Erline Levine, MD;  Location: [place] [facility]URO ORS;  Service: Neurosurgery;  Laterality: N/A;  . BIV PACEMAKER GENERATOR CHANGEOUT N/A 04/02/2019   Procedure: BIV PACEMAKER GENERATOR CHANGEOUT;  Surgeon: Evans Lance, MD;  Location: [place] CV LAB;  Service: Cardiovascular;  Laterality: N/A;  . CARDIAC CATHETERIZATION  2013  . CARDIAC SURGERY     bypass X 2  . COLONOSCOPY WITH PROPOFOL N/A 05/08/2016   diverticulosis, int hem, no f/u needed [name])  . CORONARY ARTERY BYPASS GRAFT  2013   LIMA-LAD, SVG-PDA 03/27/11 (Dr. Francee Gentile)  . CRANIOTOMY N/A 10/15/2015   Procedure: LEFT FRONTAL CRANIOTOMY TUMOR EXCISION with  Curve;  Surgeon: Erline Levine, MD;  Location: [place] [facility]UR[facility] OR[facility];  Service: Neurosurgery;  Laterality: N/A;  CRANIOTOMY TUMOR EXCISION  . EYE SURGERY    . FOOT SURGERY  08/2010  . JOINT REPLACEMENT Left    partial knee  . KNEE ARTHROSCOPY  12/2008  . LYMPH NODE BIOPSY    . PACEMAKER INSERTION    . TONSILLECTOMY      Current Medications: No outpatient medications have been marked as taking for the 06/10/19 encounter (Appointment) with Rise Mu, PA-C.    Allergies:   Ciprofloxacin, Ezetimibe, and Simvastatin   Social History   Socioeconomic History  . Marital status: Married    Spouse name: Not on file  . Number of children: 2  . Years of education: Not on file  . Highest education level: Not on file  Occupational History  . Occupation: retired Pharmacist, community  Tobacco Use  . Smoking status: Former Smoker    Packs/day: 1.00    Years: 3.00    Pack years: 3.00    Quit date: 04/10/1957    Years since quitting: 62.2  . Smokeless tobacco: Never Used  Substance and Sexual Activity  . Alcohol use: Yes    Alcohol/week: 14.0 standard drinks    Types: 10 Glasses of wine, 4 Cans of beer  per week  . Drug use: No  . Sexual activity: Not Currently  Other Topics Concern  . Not on file  Social History Narrative   Lives with wife [name]) with dementia in [place] children - daughter [name] and son    Occ: retired Pharmacist, community practiced in [place]   Has living will   [name] is health care [place]   Requests DNR-- done 09/14/15, declines tube feeds - verified 03/2019   Social Determinants of Health   Financial Resource Strain:   . Difficulty of Paying Living Expenses: Not on file  Food Insecurity:   . Worried About Charity fundraiser in the Last Year: Not on file  . Ran Out of Food in the Last Year: Not on file  Transportation Needs:   . Lack of Transportation (Medical): Not on file  . Lack of Transportation (Non-Medical): Not on file  Physical Activity:   . Days of  Exercise per Week: Not on file  . Minutes of Exercise per Session: Not on file  Stress:   . Feeling of Stress : Not on file  Social Connections:   . Frequency of Communication with Friends and Family: Not on file  . Frequency of Social Gatherings with Friends and Family: Not on file  . Attends Religious Services: Not on file  . Active Member of Clubs or Organizations: Not on file  . Attends Archivist Meetings: Not on file  . Marital Status: Not on file     Family History:  The patient's family history includes Cancer in his maternal grandmother; Cancer (age of onset: 75) in his mother; Heart attack in his father; Heart disease in his father; Hypertension in his father and mother; Rheum arthritis in his sister.  ROS:   ROS   EKGs/Labs/Other Studies Reviewed:    Studies reviewed were summarized above. The additional studies were reviewed today:  2D echo 11/2018: 1. The left ventricle has a visually estimated ejection fraction of 55%.  The cavity size was normal. There is mildly increased left ventricular  wall thickness. Left ventricular diastolic Doppler parameters are  consistent with impaired relaxation. Basal  to mid inferolateral hypokinesis.  2. The right ventricle has mildly reduced systolic function. The cavity  was mildly enlarged. There is no increase in right ventricular wall  thickness.  3. Left atrial size was mildly dilated.  4. Right atrial size was mildly dilated.  5. Mild calcification of the mitral valve leaflet. There is mild mitral  annular calcification present. No evidence of mitral valve stenosis. No  significant mitral regurgitation.  6. The aortic valve is tricuspid. Moderate calcification of the aortic  valve. No stenosis of the aortic valve.  7. The aorta is abnormal unless otherwise noted.  8. There is mild dilatation of the aortic root measuring 39 mm.  9. The inferior vena cava was normal in size with <50% respiratory    variability. No complete TR doppler jet so unable to estimate PA systolic  pressure.  10. Technically difficult study with poor acoustic windows. Definity  images also were poor quality.    EKG:  EKG is ordered today.  The EKG ordered today demonstrates ***  Recent Labs: 01/16/2019: TSH 2.17 06/05/2019: Magnesium 2.0 06/09/2019: ALT 11; BUN 43; Creatinine, Ser 1.82; Hemoglobin 12.8; Platelets 221; Potassium 4.3; Sodium 140  Recent Lipid Panel    Component Value Date/Time   CHOL 165 03/21/2018 1106   TRIG 352.0 (H) 03/21/2018  1106   HDL 40.70 03/21/2018 1106   CHOLHDL 4 03/21/2018 1106   VLDL 70.4 (H) 03/21/2018 1106   LDLCALC 135 (H) 08/24/2015 0336   LDLDIRECT 81.0 03/21/2018 1106    PHYSICAL EXAM:    VS:  There were no vitals taken for this visit.  BMI: There is no height or weight on file to calculate BMI.  Physical Exam  Wt Readings from Last 3 Encounters:  06/09/19 197 lb (89.4 kg)  06/02/19 197 lb 6.4 oz (89.5 kg)  05/27/19 198 lb 12.8 oz (90.2 kg)     ASSESSMENT & PLAN:   1. ***  Disposition: F/u with Dr. Rockey Situ or an APP in ***, and EP as directed.   Medication Adjustments/Labs and Tests Ordered: Current medicines are reviewed at length with the patient today.  Concerns regarding medicines are outlined above. Medication changes, Labs and Tests ordered today are summarized above and listed in the Patient Instructions accessible in Encounters.   Signed, Christell Faith, PA-C 06/09/2019 12:44 PM     [address] [place], [place] [address] [phone]

## 2019-06-09 NOTE — Progress Notes (Addendum)
[place]  [phone] [phone]   Name: David [name]. Date: 06/09/2019 MRN: [id number]  DOB: 10/13/1940  Patient Care Team: [name], MD as PCP - General (Family Medicine) Cira Rue, RN Nurse Navigator as Registered Nurse (Medical Oncology) Acquanetta Chain, DO as Consulting Physician [facility] and Palliative Medicine) [name], MD as Consulting Physician (Hematology and Oncology)    REASON FOR CONSULTATION: David Zinni. is a 79 y.o. male with multiple medical problems including acute promyelocytic leukemia in remission on current treatment with arsenic and ATRA.  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  Patient follows at [facility] for his APML but receives treatment locally.  He has had fatigue.  Patient was referred to palliative care to help address goals and manage ongoing symptoms.   SOCIAL HISTORY:     reports that he quit smoking about 62 years ago. He has a 3.00 pack-year smoking history. He has never used smokeless tobacco. He reports current alcohol use of about 14.0 standard drinks of alcohol per week. He reports that he does not use drugs.   Patient is married and lives at home with his wife.  They live at [facility] in an apartment.  He has a son in [place] and a daughter in [place].  Patient is a retired Pharmacist, community.  ADVANCE DIRECTIVES:  On file  CODE STATUS: DNR/DNI (MOST form on file)  PAST MEDICAL HISTORY: Past Medical History:  Diagnosis Date  . Anginal pain ([place])   . Arthritis    mostly hands  . Benign familial hematuria   . BPH (benign prostatic hypertrophy)   . Brain cancer ([place])    melanoma with brain met  . Coronary artery disease   . GERD (gastroesophageal reflux disease)   . High cholesterol   . Hypertension   .  Hypothyroidism   . Intracerebral hemorrhage ([place])   . Melanoma ([place])   . Metastatic melanoma to lung ([place])   . Mood disorder ([place])   . Myocardial infarction ([place])   . Pacemaker    due to syncope, 3rd degree HB (upgrade to [facility]. Jude CRT-P 02/25/13 (Dr. Uvaldo Rising)  . Rectal bleed    due to NSAIDS  . Renal disorder   . Sepsis ([place]) 12/09/2018  . Skin cancer    melanoma  . Sleep apnea    uses CPAP  . Stroke [facility])     PAST SURGICAL HISTORY:  Past Surgical History:  Procedure Laterality Date  . APPLICATION OF CRANIAL NAVIGATION N/A 10/15/2015   Procedure: APPLICATION OF CRANIAL NAVIGATION;  Surgeon: Erline Levine, MD;  Location: [place] NEURO ORS;  Service: Neurosurgery;  Laterality: N/A;  . BIV PACEMAKER GENERATOR CHANGEOUT N/A 04/02/2019   Procedure: BIV PACEMAKER GENERATOR CHANGEOUT;  Surgeon: Evans Lance, MD;  Location: [place] CV LAB;  Service: Cardiovascular;  Laterality: N/A;  . CARDIAC CATHETERIZATION  2013  . CARDIAC SURGERY     bypass X 2  . COLONOSCOPY WITH PROPOFOL N/A 05/08/2016   diverticulosis, int hem, no f/u needed [name])  . CORONARY ARTERY BYPASS GRAFT  2013   LIMA-LAD, SVG-PDA 03/27/11 (Dr. Francee Gentile)  . CRANIOTOMY N/A 10/15/2015   Procedure: LEFT FRONTAL CRANIOTOMY TUMOR EXCISION with Curve;  Surgeon: Erline Levine, MD;  Location: [place] NEURO ORS;  Service: Neurosurgery;  Laterality: N/A;  CRANIOTOMY TUMOR  EXCISION  . EYE SURGERY    . FOOT SURGERY  08/2010  . JOINT REPLACEMENT Left    partial knee  . KNEE ARTHROSCOPY  12/2008  . LYMPH NODE BIOPSY    . PACEMAKER INSERTION    . TONSILLECTOMY      HEMATOLOGY/ONCOLOGY HISTORY:  Oncology History Overview Note  Metastatic melanoma ([place])   Staging form: Melanoma of the Skin, AJCC 7th Edition     Clinical stage from 09/21/2015: Stage IV [place], [place], [place]) - Signed by [name], MD on 10/09/2015     Malignant melanoma ([place])  09/16/2015 Imaging   PET scan showed a hypermetabolic 4.4BE lingular nodule, and a 62m right upper lobe lung  nodule, left subclavicular nodes, and bone metastasis in the proximal right humerus and right femur.    09/21/2015 Initial Diagnosis   Metastatic melanoma ([place]   09/21/2015 Initial Biopsy   Left subclavicular lymph node biopsy showed prostatic of melanoma   09/21/2015 Miscellaneous   BRAF V600K mutation (+)    10/04/2015 Imaging   CT head with without contrast showed a 2.0 x 1.6 x 1.7 cm superficial left frontal hyperdense lesion, with postcontrast enhancement, lying within the previous hemorrhagic area, concerning for solitary melanoma metastasis   10/06/2015 - 09/06/2016 Chemotherapy   Nivolumab [id number] 2 weeks, held due to his dexamethasone for radionecrosis    10/13/2015 - 10/13/2015 Radiation Therapy   10/13/2015 Preop SRS treatment: 14 Gy  to the left frontal lesion in 1 fraction   10/15/2015 Surgery   left front craniotomy and tumor excision    10/15/2015 Pathology Results   left frontal brain mass excision showed metastatic melanoma, 1.5X1.5X0.6cm   01/13/2016 Imaging   CT head without and with contrast showed ill-defined enhancement and dural thickening of the left frontal resection cavity may represent a post treatment changes or residual neoplasm. A new 6 mm hypodensity in the right cerebellar hemisphere with uncertainty enhancement may represent a metastasis or small brain parenchymal hemorrhage.    01/27/2016 - 01/27/2016 Radiation Therapy   01/27/16 SRS Treatment:  20 Gy in 1 fraction to a 6 mm right cerebellar brain met    02/2016 - 05/24/2018 Chemotherapy   Monthly Xgeva starting 02/2016, changed to every 3 months on 01/30/2018. Stopped on 05/24/2018   03/29/2016 PET scan   PET 03/29/2016 IMPRESSION: 1. Overall there has been an interval response to therapy. The index lesions sided on previous exam it are less hypermetabolic when compared with the previous exam. 2. There is an intramuscular lesion within the right masseter which has increased FDG uptake compared with  previous exam. 3. No new sites of disease identified. 4. Aortic atherosclerosis and infrarenal abdominal aortic aneurysm. Recommend followup by ultrasound in 3 years. This recommendation follows ACR consensus guidelines: White Paper of the ACR Incidental Findings Committee II on Vascular Findings. J Joellyn Ruedadiol 2013; 10:789-794   04/20/2016 Imaging   CT Head w wo contrast 1. Mild patchy enhancement at the left anterior frontal lobe treatment site (series 11, image 39) without mass effect and stable surrounding white matter hypodensity without strong evidence of acute vasogenic edema. Recommend continued CT surveillance. 2. No new metastatic disease or new intracranial abnormality identified. 3. Probable benign 14 mm left parietal bone lucent lesion, unchanged since May 2017.   05/06/2016 - 05/08/2016 Hospital Admission   Pt was admitted for rectal bleeding for one day. Breathing spontaneously resolved, hemoglobin dropped from 15 to 10.5, did not require blood transfusion. Colonoscopy showed no  active bleeding, except diverticulosis and hemorrhoid. He was discharged to home.   05/08/2016 Procedure   Colonoscopy  - Stool in the ascending colon. Fluid aspiration performed. - Diverticulosis in the entire examined colon. - Non-bleeding internal hemorrhoids. - The examination was otherwise normal on direct and retroflexion views.   05/29/2016 Imaging   PET Image Restaging IMPRESSION: 1. Stable exam with no evidence progression. 2. Photopenia in the LEFT frontal lobe at prior surgical site. 3. Stable mildly hypermetabolic RIGHT paratracheal node. This may represent a thyroid nodule. 4. Stable size of minimally metabolic lingular pulmonary nodule. 5. Stable skeletal lesion of the RIGHT iliac crest 6. No cutaneous metabolic lesion.   07/20/2016 Imaging   CT Head W WO Contrast IMPRESSION: Pronounced change in the left frontal region. Much more regional vasogenic edema. Relatively  stable appearance of the abnormal enhancement extending from the lateral margin of the frontal horn of left lateral ventricle to the frontal brain surface. New region of abnormal enhancement surrounding the frontal horn of the left lateral ventricle measuring approximately 3.2 cm. The differential diagnosis is recurrent tumor versus radiation necrosis. The patient is reportedly not an MRI candidate. This area is large enough that CT perfusion could possibly make a reasonable differentiation.   09/02/2016 Imaging   CT Head 09/02/16 IMPRESSION: Somewhat mixed pattern of slight posterior progression of pleomorphic enhancement surrounding the LEFT lateral ventricle although no significant mass effect, and reduced vasogenic edema compared with the scan in April.   09/27/2016 - 11/02/2016 Chemotherapy   Start dabrafenib and trametinib 09/27/16, stopped due to severe fatigue     10/05/2016 Imaging   CT Head w & w/o contrast  IMPRESSION: 1. Decreased size of the enhancing area within the left frontal lobe treatment site. Surrounding edema has also decreased. The findings support continued evolution of radiation necrosis. 2. No new enhancing lesions.   10/18/2016 PET scan   IMPRESSION: 1. New focal hypermetabolism in the proximal sigmoid colon with associated focal colonic wall thickening and mild pericolonic fat stranding at the site of a proximal sigmoid diverticulum, favored to represent mild acute sigmoid diverticulitis. Metastatic disease is considered less likely. Clinical correlation is necessary. Consider appropriate antibiotic therapy, as clinically warranted. Attention to this region recommended on future PET-CT follow-up. 2. Otherwise complete metabolic response with no evidence of hypermetabolic metastatic disease. Lingular pulmonary nodule is slightly decreased in size and demonstrates no significant metabolism. 3. No residual hypermetabolism in the right thyroid lobe  nodule, which is stable in size. 4. Aortic Atherosclerosis (ICD10-I70.0). Stable 3.1 cm infrarenal abdominal aortic aneurysm. Aortic aneurysm NOS (ICD10-I71.9). Recommend followup by ultrasound in 3 years. This recommendation follows ACR consensus guidelines: White Paper of the ACR Incidental Findings Committee II on Vascular Findings. Joellyn Rued Radiol 2013; 71:062-694.   11/01/2016 - 11/04/2016 Hospital Admission   Patient admitted to the hospital due to hypercalcemia where he received IV fluids and pamidronate   12/13/2016 PET scan   PET 12/13/16 IMPRESSION: 1. No findings to suggest metastatic disease on today's examination. 2. Previously noted hypermetabolism in the sigmoid colon has resolved, presumably indicative of a focus of diverticulitis on the prior study. Today's study does demonstrate relatively diffuse hypermetabolism throughout the cecum, ascending colon and proximal transverse colon where there is some mild mural thickening, favored to reflect mild chronic inflammation related to underlying diverticular disease. No definite inflammatory changes on the CT portion of the examination to strongly suggest an active acute diverticulitis at this time. No discrete lesion to suggest  neoplasm. 3. Previously noted 8 mm lingular nodule is stable in size and known remains non hypermetabolic. This is nonspecific but reassuring. Continued attention on future followup studies is recommended. 4. Aortic atherosclerosis, in addition to left main and 3 vessel coronary artery disease. Status post median sternotomy for CABG including LIMA to the LAD. In addition, there is mild fusiform aneurysmal dilatation of the infrarenal abdominal aorta (3.1 cm in diameter) as well as aneurysmal dilatation of the left common iliac   12/18/2016 - 12/31/2017 Chemotherapy   Restarted nivolamab 239m every 2 weeks, with tapering dose dexamethasone     04/18/2017 Imaging   PET Scan IMPRESSION: 1. No  hypermetabolic lesion to suggest active malignancy. 2. Stable 8 mm lingular nodule is not currently hypermetabolic but merit surveillance. Infrarenal abdominal aortic aneurysm 3.1 cm in diameter. Recommend followup by [facility] 3 years. 3. Other imaging findings of potential clinical significance: Postoperative findings in the left frontal lobe. Right renal cyst. Aortic Atherosclerosis (ICD10-I70.0). Osteoarthritis. Lumbar scoliosis. Left knee effusion. Colonic diverticulosis.    10/10/2017 PET scan   IMPRESSION: 1. Small focus of FDG accumulation in the subcutaneous fat of the right gluteal region, nonspecific. Attention on follow-up suggested. 2. Otherwise stable exam without new or progressive hypermetabolic disease in the interval since prior PET-CT. 3. Stable 8 mm nodule in the lingula. No evidence for hypermetabolism on PET imaging. 4.  Aortic Atherosclerois (ICD10-170.0)    01/29/2018 PET scan   IMPRESSION: 1. Small focus of hypermetabolism in the lower left paratracheal neck, which appears to localize to the posterior left thyroid lobe, without discrete thyroid nodule on the CT images, decreased in metabolism since 10/10/2017 PET-CT. This finding is nonspecific and may represent a hypermetabolic thyroid nodule. Consider thyroid ultrasound correlation. 2. No new hypermetabolic findings suspicious for metastatic disease. Previously described low level metabolism in the subcutaneous right gluteal region has resolved, compatible with resolved inflammatory focus. No evidence of recurrent hypermetabolism within the stable lingular pulmonary nodule. No evidence of recurrent osseous hypermetabolism. 3. Stable infrarenal 3.4 cm Abdominal Aortic Aneurysm (ICD10-I71.9). Recommend follow-up aortic ultrasound in 3 years. This recommendation follows ACR consensus guidelines: White Paper of the ACR Incidental Findings Committee II on Vascular Findings. J Am Coll Radiol 2013;; 03:500-938 4.   Aortic Atherosclerosis (ICD10-I70.0).   09/18/2018 PET scan   PET 09/18/18  IMPRESSION: 1. No findings of active malignancy. The known small right cerebellar mass is not readily apparent on PET-CT but was shown on today's CT head. 2. 3.2 cm infrarenal abdominal aortic aneurysm. Recommend followup by [facility] 3 years. This recommendation follows ACR consensus guidelines: White Paper of the ACR Incidental Findings Committee II on Vascular Findings. J Am Coll Radiol 2013; 10:789-794. Aortic aneurysm NOS (ICD10-I71.9). 3. Other imaging findings of potential clinical significance: Left frontal lobe encephalomalacia underlying the craniotomy site. Chronic paranasal sinusitis. Aortic Atherosclerosis (ICD10-I70.0). Coronary atherosclerosis. Mild cardiomegaly. Colonic diverticulosis. Dextroconvex lumbar scoliosis. Small left knee effusion with mild synovitis. Postoperative findings in the right foot.   09/18/2018 Imaging   CT head  IMPRESSION: 1. No evidence of new intracranial metastases or acute abnormality. 2. Unchanged 4 mm right cerebellar metastasis. 3. Stable post treatment changes in the left frontal lobe.   AP[place] (acute promyelocytic [place]  01/08/2019 Initial Diagnosis   APML (acute promyelocytic leukemia) in remission ([place]   04/14/2019 -  Chemotherapy   The patient had palonosetron (ALOXI) injection 0.25 mg, 0.25 mg, Intravenous,  Once, 1 of 4 cycles Administration: 0.25 mg (  04/15/2019), 0.25 mg (04/17/2019), 0.25 mg (04/21/2019), 0.25 mg (04/23/2019), 0.25 mg (04/25/2019), 0.25 mg (04/28/2019), 0.25 mg (04/30/2019), 0.25 mg (05/02/2019), 0.25 mg (05/05/2019), 0.25 mg (05/07/2019), 0.25 mg (05/09/2019), 0.25 mg (04/18/2019) arsenic trioxide (TRISENOX) 13.3 mg in sodium chloride 0.9 % 250 mL chemo Infusion, 0.15 mg/kg = 13.3 mg (100 % of original dose 0.15 mg/kg), Intravenous,  Once, 1 of 4 cycles Dose modification: 0.15 mg/kg/day (original dose 0.15 mg/kg, Cycle 1, Reason: Other  (see comments)), 0.15 mg/kg (original dose 0.15 mg/kg, Cycle 1, Reason: Other (see comments), Comment: change to mg/kg) Administration: 13.3 mg (04/21/2019), 13.3 mg (04/22/2019), 13.3 mg (04/23/2019), 13.3 mg (04/24/2019), 13.3 mg (04/25/2019), 13.3 mg (04/28/2019), 13.3 mg (04/29/2019), 13.3 mg (04/30/2019), 13.3 mg (05/01/2019), 13.3 mg (05/02/2019), 13.3 mg (05/05/2019), 13.3 mg (05/06/2019), 13.3 mg (05/07/2019), 13.3 mg (05/08/2019), 13.3 mg (05/09/2019)  for chemotherapy treatment.      ALLERGIES:  is allergic to ciprofloxacin; ezetimibe; and simvastatin.  MEDICATIONS:  Current Outpatient Medications  Medication Sig Dispense Refill  . acetaminophen (TYLENOL) 325 MG tablet Take 2 tablets (650 mg total) by mouth every 6 (six) hours as needed for mild pain (or Fever >/= 101).    Marland Kitchen acyclovir (ZOVIRAX) 400 MG tablet Take 400 mg by mouth 2 (two) times daily.    Marland Kitchen allopurinol (ZYLOPRIM) 100 MG tablet Take 2 tablets (200 mg total) by mouth daily. 180 tablet 1  . aspirin EC 81 MG tablet Take 81 mg by mouth every evening.     . Blood Glucose Monitoring Suppl (ONETOUCH VERIO) w/Device KIT 1 kit by Does not apply route as directed. E11.65 1 kit 0  . carboxymethylcellulose (REFRESH TEARS) 0.5 % SOLN Apply to eye.    . Cholecalciferol (VITAMIN D3) 25 MCG (1000 UT) CAPS Take 1 capsule (1,000 Units total) by mouth daily. 30 capsule 0  . citalopram (CELEXA) 10 MG tablet Take 1 tablet (10 mg total) by mouth daily. 90 tablet 1  . colchicine 0.6 MG tablet Take 1 tablet (0.6 mg total) by mouth daily as needed (gout flare). Take 2 tablets on the first day of flare 30 tablet 0  . glucose blood test strip E11.65 Use as instructed to check sugars three time daily and as needed 100 each 12  . HUMALOG KWIKPEN 100 UNIT/ML KwikPen     . hydrocortisone cream 1 % Apply topically.    . insulin aspart (NOVOLOG FLEXPEN) 100 UNIT/ML FlexPen Use three times daily with meals as needed per sliding scale: 2 units for every 50 units over  150 (2u for 150-199, 4u for 200-249, 6u for 250-299, etc) 3 mL 1  . ketoconazole (NIZORAL) 2 % shampoo Apply topically.    . Lancets (ONETOUCH ULTRASOFT) lancets E11.65 Use as instructed to check sugars three time daily and as needed 100 each 12  . levothyroxine (SYNTHROID) 137 MCG tablet TAKE 1 TABLET BY MOUTH ONCE DAILY BEFORE BREAKFAST (Patient taking differently: Take 137 mcg by mouth daily before breakfast. ) 30 tablet 11  . methylphenidate (RITALIN) 5 MG tablet Take 1 tablet (5 mg total) by mouth 2 (two) times daily with breakfast and lunch. 60 tablet 0  . metoprolol succinate (TOPROL-XL) 50 MG 24 hr tablet TAKE 1 AND 1/2 TABLET (75 MG) BY MOUTH EVERY DAY WITH OR IMMEDIATELY FOLLOWING A MEAL *DO NOT CRUSH* (Patient taking differently: Take 50 mg by mouth daily. ) 50 tablet 11  . mineral oil-hydrophilic petrolatum (AQUAPHOR) ointment Apply topically.    . ondansetron (ZOFRAN-ODT) 4 MG disintegrating  tablet Take 1 tablet (4 mg total) by mouth every 8 (eight) hours as needed for nausea or vomiting. 60 tablet 2  . polyethylene glycol (MIRALAX / GLYCOLAX) 17 g packet Take 17 g by mouth 2 (two) times daily. (Patient taking differently: Take 17 g by mouth daily. ) 14 each 0  . senna (SENOKOT) 8.6 MG TABS tablet Take 1 tablet (8.6 mg total) by mouth at bedtime. 120 tablet 0  . senna-docusate (SENOKOT-S) 8.6-50 MG tablet Take by mouth.    . sodium chloride (ALTAMIST SPRAY) 0.65 % nasal spray Place into the nose.    . testosterone cypionate (DEPOTESTOSTERONE CYPIONATE) 200 MG/ML injection Inject 100 mg into the muscle every 14 (fourteen) days.    . traZODone (DESYREL) 50 MG tablet Take 0.5-1 tablets (25-50 mg total) by mouth at bedtime. (Patient taking differently: Take 25 mg by mouth at bedtime as needed for sleep. ) 30 tablet 1  . tretinoin (VESANOID) 10 MG capsule     . Turmeric Curcumin 500 MG CAPS Take by mouth.     No current facility-administered medications for this visit.    Facility-Administered Medications Ordered in Other Visits  Medication Dose Route Frequency Provider Last Rate Last Admin  . 0.9 %  sodium chloride infusion  1,000 mL Intravenous Once [name], MD      . arsenic trioxide (TRISENOX) 13.3 mg in sodium chloride 0.9 % 250 mL chemo Infusion  0.15 mg/kg/day (Treatment Plan Recorded) Intravenous Daily [name], MD   Stopped at 04/21/19 1404  . arsenic trioxide (TRISENOX) 13.3 mg in sodium chloride 0.9 % 250 mL chemo Infusion  0.15 mg/kg/day (Treatment Plan Recorded) Intravenous Daily [name], MD 132 mL/hr at 05/09/19 1340 13.3 mg at 05/09/19 1340  . heparin lock flush 100 unit/mL  500 Units Intracatheter Once PRN [name], MD      . sodium chloride flush (NS) 0.9 % injection 10 mL  10 mL Intracatheter PRN [name], MD   10 mL at 05/09/19 1318    VITAL SIGNS: There were no vitals taken for this visit. There were no vitals filed for this visit.  Estimated body mass index is 28.67 kg/m as calculated from the following:   Height as of 05/27/19: 5' 9.5" (1.765 m).   Weight as of an earlier encounter on 06/09/19: 197 lb (89.4 kg).  LABS: CBC:    Component Value Date/Time   WBC 5.3 06/09/2019 0922   HGB 12.8 (L) 06/09/2019 0922   HGB 14.0 05/24/2018 1445   HGB 15.8 03/26/2017 0928   HCT 39.7 06/09/2019 0922   HCT 46.9 03/26/2017 0928   PLT 221 06/09/2019 0922   PLT 153 05/24/2018 1445   PLT 158 03/26/2017 0928   MCV 102.3 (H) 06/09/2019 0922   MCV 92.6 03/26/2017 0928   NEUTROABS 3.9 06/09/2019 0922   NEUTROABS 3.4 03/26/2017 0928   LYMPHSABS 0.5 (L) 06/09/2019 0922   LYMPHSABS 0.6 (L) 03/26/2017 0928   MONOABS 0.5 06/09/2019 0922   MONOABS 0.6 03/26/2017 0928   EOSABS 0.3 06/09/2019 0922   EOSABS 0.3 03/26/2017 0928   BASOSABS 0.0 06/09/2019 0922   BASOSABS 0.0 03/26/2017 0928   Comprehensive Metabolic Panel:    Component Value Date/Time   NA 140 06/09/2019 0922   NA 140 03/26/2017 0928    K 4.3 06/09/2019 0922   K 4.7 03/26/2017 0928   CL 107 06/09/2019 0922   CO2 21 (L) 06/09/2019 0922   CO2 21 (L)  03/26/2017 0928   BUN 43 (H) 06/09/2019 0922   BUN 19.3 03/26/2017 0928   CREATININE 1.82 (H) 06/09/2019 0922   CREATININE 1.77 (H) 05/24/2018 1445   CREATININE 1.2 03/26/2017 0928   GLUCOSE 184 (H) 06/09/2019 0922   GLUCOSE 95 03/26/2017 0928   CALCIUM 9.4 06/09/2019 0922   CALCIUM 8.7 03/26/2017 0928   AST 22 06/09/2019 0922   AST 18 05/24/2018 1445   AST 22 03/26/2017 0928   ALT 11 06/09/2019 0922   ALT 13 05/24/2018 1445   ALT 15 03/26/2017 0928   ALKPHOS 51 06/09/2019 0922   ALKPHOS 58 03/26/2017 0928   BILITOT 0.6 06/09/2019 0922   BILITOT 0.6 05/24/2018 1445   BILITOT 0.58 03/26/2017 0928   PROT 6.3 (L) 06/09/2019 0922   PROT 6.8 03/26/2017 0928   ALBUMIN 3.5 06/09/2019 0922   ALBUMIN 3.7 03/26/2017 0928    RADIOGRAPHIC STUDIES: CT Head W Wo Contrast  Result Date: 05/14/2019 CLINICAL DATA:  Follow-up metastatic means. EXAM: CT HEAD WITHOUT AND WITH CONTRAST TECHNIQUE: Contiguous axial images were obtained from the base of the skull through the vertex without and with intravenous contrast CONTRAST:  88m OMNIPAQUE IOHEXOL 300 MG/ML  SOLN COMPARISON:  02/14/2018 FINDINGS: Brain: Enhancing focus in the right cerebellum is no longer seen. Postoperative left frontal lobe with prominent volume loss and encephalomalacia. No abnormal enhancement throughout the brain. No acute or interval infarct, hemorrhage, hydrocephalus, or collection. Vascular: Major vessels are enhancing Skull: Unremarkable remote left frontal craniotomy. Negative for mass. Sinuses/Orbits: Negative IMPRESSION: No evidence of active metastatic disease. Stable post treatment brain. Electronically Signed   By: JMonte FantasiaM.D.   On: 05/14/2019 11:51    PERFORMANCE STATUS (ECOG) : 2 - Symptomatic, <50% confined to bed  Review of Systems Unless otherwise noted, a complete review of systems is  negative.  Physical Exam General: NAD, frail appearing, thin Pulmonary: Unlabored Extremities: no edema, no joint deformities Skin: no rashes Neurological: Weakness but otherwise nonfocal  IMPRESSION: Routine follow-up visit.  Patient says that he has been considering discontinuing further treatments. He says that he feels that treatments have caused him to feel poorly and imposed too high a symptom burden. Patient tells me "we all have to die sometime." He says that he would prefer to focus on quality of life over quantity.   Patient relayed similar concerns to Dr. FGrayland Ormondtoday. Plan is to bring patient back to the clinic next week for infusions and then reevaluate patient goals.  Symptomatically, patient says it mostly feels down.  He clarified that he is feeling depressed, hopeless, and weak.  Note that arsenic does have a fairly high risk of exacerbating depression.  Patient is on daily methylphenidate for fatigue.  He is being managed on citalopram 10 mg daily.  I tried calling patient's daughter but did not reach her.  Voicemail was left.  Addendum: I spoke with patient's daughter by phone.  I updated her on patient's conversation today in the clinic.  She has also been talking to patient about quantity of life versus quality of life.  She does feel that patient is depressed.  She would be in agreement with adjustment to his antidepressant regimen.  Patient is on citalopram, which is one of the SSRIs known to potentially prolong QTC.  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  He has been followed by EP for this.    Given history of prolonged QTC.  Will rotate him  from citalopram to escitalopram  PLAN: -Continue current scope of treatment -Will rotate from citalopram to escitalopram  -Could consider dose increase of methylphenidate if needed for fatigue -Follow EKG -RTC in 1 to 2 weeks   Patient expressed understanding and was in  agreement with this plan. He also understands that He can call the clinic at any time with any questions, concerns, or complaints.     Time Total: 20 minutes  Visit consisted of counseling and education dealing with the complex and emotionally intense issues of symptom management and palliative care in the setting of serious and potentially life-threatening illness.Greater than 50%  of this time was spent counseling and coordinating care related to the above assessment and plan.  Signed by: Altha Harm, PhD, NP-C

## 2019-06-10 ENCOUNTER — Inpatient Hospital Stay: Payer: Medicare Other

## 2019-06-10 ENCOUNTER — Ambulatory Visit: Payer: Medicare Other | Admitting: Physician Assistant

## 2019-06-11 ENCOUNTER — Inpatient Hospital Stay: Payer: Medicare Other

## 2019-06-11 ENCOUNTER — Other Ambulatory Visit: Payer: Self-pay | Admitting: Oncology

## 2019-06-11 MED ORDER — SERTRALINE HCL 25 MG PO TABS: 25.0000 mg | ORAL_TABLET | Freq: Every day | ORAL | 0 refills | Status: DC

## 2019-06-11 MED ORDER — SERTRALINE HCL 50 MG PO TABS: 50.0000 mg | ORAL_TABLET | Freq: Every day | ORAL | 3 refills | Status: AC

## 2019-06-12 ENCOUNTER — Inpatient Hospital Stay: Payer: Medicare Other

## 2019-06-13 ENCOUNTER — Other Ambulatory Visit: Payer: Self-pay

## 2019-06-13 ENCOUNTER — Encounter: Payer: Self-pay | Admitting: Oncology

## 2019-06-13 ENCOUNTER — Inpatient Hospital Stay: Payer: Medicare Other

## 2019-06-13 NOTE — Progress Notes (Signed)
Patient prescreened for appointment. Patient has no concerns or questions.  

## 2019-06-14 ENCOUNTER — Other Ambulatory Visit: Payer: Self-pay | Admitting: Oncology

## 2019-06-14 MED ORDER — CIPROFLOXACIN HCL 0.3 % OP SOLN: 2.0000 [drp] | OPHTHALMIC | 0 refills | Status: DC

## 2019-06-15 NOTE — Progress Notes (Signed)
  [place]  [phone] [phone]  ID: David [name]. OB: 10/10/1940  MR#: [id number]  TMY#:[id number]  Patient Care Team: [name], MD as PCP - General (Family Medicine) Minna Merritts, MD as PCP - Cardiology (Cardiology) Deboraha Sprang, MD as PCP - Electrophysiology (Cardiology) Cira Rue, RN Nurse Navigator as Registered Nurse ([place]) Acquanetta Chain, DO as Consulting Physician [facility] and Palliative Medicine) [name], MD as Consulting Physician (Hematology and Oncology)   [name], MD   06/17/2019 10:09 AM     This encounter was created in error - please disregard.

## 2019-06-16 ENCOUNTER — Inpatient Hospital Stay: Payer: Medicare Other | Admitting: Oncology

## 2019-06-16 ENCOUNTER — Inpatient Hospital Stay: Payer: Medicare Other

## 2019-06-17 ENCOUNTER — Inpatient Hospital Stay: Payer: Medicare Other

## 2019-06-18 ENCOUNTER — Other Ambulatory Visit: Payer: Self-pay

## 2019-06-18 ENCOUNTER — Encounter: Payer: Self-pay | Admitting: Oncology

## 2019-06-18 ENCOUNTER — Inpatient Hospital Stay: Payer: Medicare Other

## 2019-06-18 NOTE — Telephone Encounter (Signed)
Name of Medication: Testosterone inj Name of Pharmacy: [facility] or Written Date and Quantity:  Last Office Visit and Type: 03/14/19, hosp f/u Next Office Visit and Type: none Last Controlled Substance Agreement Date: none Last UDS: none

## 2019-06-18 NOTE — Progress Notes (Signed)
Patient stated that he had no concerns at this time.

## 2019-06-19 ENCOUNTER — Encounter: Payer: Self-pay | Admitting: Oncology

## 2019-06-19 ENCOUNTER — Inpatient Hospital Stay: Payer: Medicare Other

## 2019-06-19 ENCOUNTER — Telehealth: Payer: Self-pay | Admitting: Adult Health Nurse Practitioner

## 2019-06-19 ENCOUNTER — Inpatient Hospital Stay (HOSPITAL_BASED_OUTPATIENT_CLINIC_OR_DEPARTMENT_OTHER): Payer: Medicare Other | Admitting: Oncology

## 2019-06-19 ENCOUNTER — Inpatient Hospital Stay (HOSPITAL_BASED_OUTPATIENT_CLINIC_OR_DEPARTMENT_OTHER): Payer: Medicare Other | Admitting: Hospice and Palliative Medicine

## 2019-06-19 VITALS — BP 132/92 | HR 75 | Temp 95.1°F | Wt 197.8 lb

## 2019-06-19 DIAGNOSIS — C9241 Acute promyelocytic leukemia, in remission: Secondary | ICD-10-CM

## 2019-06-19 DIAGNOSIS — Z515 Encounter for palliative care: Secondary | ICD-10-CM | POA: Diagnosis not present

## 2019-06-19 DIAGNOSIS — G4733 Obstructive sleep apnea (adult) (pediatric): Secondary | ICD-10-CM | POA: Diagnosis not present

## 2019-06-19 DIAGNOSIS — Z85820 Personal history of malignant melanoma of skin: Secondary | ICD-10-CM | POA: Diagnosis not present

## 2019-06-19 DIAGNOSIS — N189 Chronic kidney disease, unspecified: Secondary | ICD-10-CM | POA: Diagnosis not present

## 2019-06-19 DIAGNOSIS — R531 Weakness: Secondary | ICD-10-CM | POA: Diagnosis not present

## 2019-06-19 DIAGNOSIS — R5383 Other fatigue: Secondary | ICD-10-CM | POA: Diagnosis not present

## 2019-06-19 LAB — CBC WITH DIFFERENTIAL/PLATELET
Abs Immature Granulocytes: 0.02 10*3/uL (ref 0.00–0.07)
Basophils Absolute: 0 10*3/uL (ref 0.0–0.1)
Basophils Relative: 1 %
Eosinophils Absolute: 0.4 10*3/uL (ref 0.0–0.5)
Eosinophils Relative: 9 %
HCT: 40.3 % (ref 39.0–52.0)
Hemoglobin: 13 g/dL (ref 13.0–17.0)
Immature Granulocytes: 1 %
Lymphocytes Relative: 11 %
Lymphs Abs: 0.5 10*3/uL — ABNORMAL LOW (ref 0.7–4.0)
MCH: 32.8 pg (ref 26.0–34.0)
MCHC: 32.3 g/dL (ref 30.0–36.0)
MCV: 101.8 fL — ABNORMAL HIGH (ref 80.0–100.0)
Monocytes Absolute: 0.5 10*3/uL (ref 0.1–1.0)
Monocytes Relative: 11 %
Neutro Abs: 3 10*3/uL (ref 1.7–7.7)
Neutrophils Relative %: 67 %
Platelets: 150 10*3/uL (ref 150–400)
RBC: 3.96 MIL/uL — ABNORMAL LOW (ref 4.22–5.81)
RDW: 15.2 % (ref 11.5–15.5)
WBC: 4.4 10*3/uL (ref 4.0–10.5)
nRBC: 0 % (ref 0.0–0.2)

## 2019-06-19 LAB — COMPREHENSIVE METABOLIC PANEL
ALT: 13 U/L (ref 0–44)
AST: 18 U/L (ref 15–41)
Albumin: 3.6 g/dL (ref 3.5–5.0)
Alkaline Phosphatase: 52 U/L (ref 38–126)
Anion gap: 10 (ref 5–15)
BUN: 40 mg/dL — ABNORMAL HIGH (ref 8–23)
CO2: 22 mmol/L (ref 22–32)
Calcium: 9.3 mg/dL (ref 8.9–10.3)
Chloride: 105 mmol/L (ref 98–111)
Creatinine, Ser: 1.57 mg/dL — ABNORMAL HIGH (ref 0.61–1.24)
GFR calc Af Amer: 48 mL/min — ABNORMAL LOW (ref >60)
GFR calc non Af Amer: 41 mL/min — ABNORMAL LOW (ref >60)
Glucose, Bld: 99 mg/dL (ref 70–99)
Potassium: 4 mmol/L (ref 3.5–5.1)
Sodium: 137 mmol/L (ref 135–145)
Total Bilirubin: 0.5 mg/dL (ref 0.3–1.2)
Total Protein: 6.2 g/dL — ABNORMAL LOW (ref 6.5–8.1)

## 2019-06-19 MED ORDER — TESTOSTERONE CYPIONATE 200 MG/ML IM SOLN: 100.0000 mg | INTRAMUSCULAR | 0 refills | Status: DC

## 2019-06-19 MED ORDER — HEPARIN SOD (PORK) LOCK FLUSH 100 UNIT/ML IV SOLN
500.0000 [IU] | Freq: Once | INTRAVENOUS | Status: AC
  Administered 2019-06-19: 500 [IU] via INTRAVENOUS
  Filled 2019-06-19: qty 5

## 2019-06-19 MED ORDER — SODIUM CHLORIDE 0.9% FLUSH
10.0000 mL | Freq: Once | INTRAVENOUS | Status: AC
  Administered 2019-06-19: 10 mL via INTRAVENOUS
  Filled 2019-06-19: qty 10

## 2019-06-19 NOTE — Progress Notes (Signed)
[place]  [phone] [phone]  ID: David [name]. OB: [birth year]  MR#: [id number]  KKX#:[id number]  Patient Care Team: [name], MD as PCP - General (Family Medicine) Minna Merritts, MD as PCP - Cardiology (Cardiology) Deboraha Sprang, MD as PCP - Electrophysiology (Cardiology) Cira Rue, RN Nurse Navigator as Registered Nurse ([place]) Acquanetta Chain, DO as Consulting Physician [facility] and Palliative Medicine) [name], MD as Consulting Physician (Hematology and Oncology)  CHIEF COMPLAINT: Acute promyelocytic leukemia in remission  INTERVAL HISTORY: Patient returns to clinic today for further evaluation and discussion on whether to reinitiate maintenance treatment.  He continues to have chronic weakness and fatigue and states he has not bounced back as he had hoped since completing his first cycle of arsenic and ATRA.  He has no neurologic complaints.  He has a good appetite and denies weight loss.  He denies any recent fevers or illnesses. He denies any chest pain, shortness of breath, cough, or hemoptysis.  He denies any nausea, vomiting, constipation, or diarrhea.  He has no urinary complaints.  Patient offers no further specific complaints today.  REVIEW OF SYSTEMS:   Review of Systems  Constitutional: Positive for malaise/fatigue. Negative for fever and weight loss.  Respiratory: Negative.  Negative for cough, hemoptysis and shortness of breath.   Cardiovascular: Negative.  Negative for chest pain and leg swelling.  Gastrointestinal: Negative.  Negative for abdominal pain and nausea.  Genitourinary: Negative.  Negative for hematuria.  Musculoskeletal: Negative.  Negative for back pain.  Skin: Negative.  Negative for rash.  Neurological: Positive for weakness. Negative for dizziness, focal weakness and headaches.  Psychiatric/Behavioral: Negative.  The patient is not nervous/anxious.      As per HPI. Otherwise, a complete review of systems is negative.  PAST MEDICAL HISTORY: Past Medical History:  Diagnosis Date  . Anginal pain ([place])   . Arthritis    mostly hands  . Benign familial hematuria   . BPH (benign prostatic hypertrophy)   . Brain cancer ([place])    melanoma with brain met  . Coronary artery disease   . GERD (gastroesophageal reflux disease)   . High cholesterol   . Hypertension   . Hypothyroidism   . Intracerebral hemorrhage ([place])   . Melanoma ([place])   . Metastatic melanoma to lung ([place])   . Mood disorder ([place])   . Myocardial infarction ([place])   . Pacemaker    due to syncope, 3rd degree HB (upgrade to [facility]. Jude CRT-P 02/25/13 (Dr. Uvaldo Rising)  . Rectal bleed    due to NSAIDS  . Renal disorder   . Sepsis ([place]) 12/09/2018  . Skin cancer    melanoma  . Sleep apnea    uses CPAP  . Stroke [facility])     PAST SURGICAL HISTORY: Past Surgical History:  Procedure Laterality Date  . APPLICATION OF CRANIAL NAVIGATION N/A 10/15/2015   Procedure: APPLICATION OF CRANIAL NAVIGATION;  Surgeon: Erline Levine, MD;  Location: [place] NEURO ORS;  Service: Neurosurgery;  Laterality: N/A;  . BIV PACEMAKER GENERATOR CHANGEOUT N/A 04/02/2019   Procedure: BIV PACEMAKER GENERATOR CHANGEOUT;  Surgeon: Evans Lance, MD;  Location: [place] CV LAB;  Service: Cardiovascular;  Laterality: N/A;  . CARDIAC CATHETERIZATION  2013  . CARDIAC SURGERY     bypass X 2  . COLONOSCOPY WITH PROPOFOL N/A 05/08/2016   diverticulosis, int hem, no f/u needed [name])  . CORONARY ARTERY BYPASS GRAFT  2013   LIMA-LAD,  SVG-PDA 03/27/11 (Dr. Francee Gentile)  . CRANIOTOMY N/A 10/15/2015   Procedure: LEFT FRONTAL CRANIOTOMY TUMOR EXCISION with Curve;  Surgeon: Erline Levine, MD;  Location: [place] NEURO ORS;  Service: Neurosurgery;  Laterality: N/A;  CRANIOTOMY TUMOR EXCISION  . EYE SURGERY    . FOOT SURGERY  08/2010  . JOINT REPLACEMENT Left    partial knee  . KNEE ARTHROSCOPY  12/2008  . LYMPH NODE BIOPSY    .  PACEMAKER INSERTION    . TONSILLECTOMY      FAMILY HISTORY: Family History  Problem Relation Age of Onset  . Cancer Mother 23       lung   . Hypertension Mother   . Hypertension Father   . Heart attack Father   . Heart disease Father   . Rheum arthritis Sister   . Cancer Maternal Grandmother     ADVANCED DIRECTIVES (Y/N):  N  HEALTH MAINTENANCE: Social History   Tobacco Use  . Smoking status: Former Smoker    Packs/day: 1.00    Years: 3.00    Pack years: 3.00    Quit date: 04/10/1957    Years since quitting: 62.2  . Smokeless tobacco: Never Used  Substance Use Topics  . Alcohol use: Yes    Alcohol/week: 14.0 standard drinks    Types: 10 Glasses of wine, 4 Cans of beer per week  . Drug use: No     Colonoscopy:  PAP:  Bone density:  Lipid panel:  Allergies  Allergen Reactions  . Ciprofloxacin Other (See Comments)    Tendonitis  . Ezetimibe Other (See Comments)    Unknown reaction  . Simvastatin Other (See Comments)    Reaction:  Gave pt a fever  Fever - temp of 103.   In 2003    Current Outpatient Medications  Medication Sig Dispense Refill  . acetaminophen (TYLENOL) 325 MG tablet Take 2 tablets (650 mg total) by mouth every 6 (six) hours as needed for mild pain (or Fever >/= 101).    Marland Kitchen acyclovir (ZOVIRAX) 400 MG tablet Take 400 mg by mouth 2 (two) times daily.    Marland Kitchen allopurinol (ZYLOPRIM) 100 MG tablet Take 2 tablets (200 mg total) by mouth daily. 180 tablet 1  . aspirin EC 81 MG tablet Take 81 mg by mouth every evening.     . Blood Glucose Monitoring Suppl (ONETOUCH VERIO) w/Device KIT 1 kit by Does not apply route as directed. E11.65 1 kit 0  . Cholecalciferol (VITAMIN D3) 25 MCG (1000 UT) CAPS Take 1 capsule (1,000 Units total) by mouth daily. 30 capsule 0  . ciprofloxacin (CILOXAN) 0.3 % ophthalmic solution Place 2 drops into the left eye every 2 (two) hours while awake. Administer 1 drop, every 2 hours, while awake, for 2 days. Then 1 drop, every 4 hours,  while awake, for the next 5 days. 5 mL 0  . colchicine 0.6 MG tablet Take 1 tablet (0.6 mg total) by mouth daily as needed (gout flare). Take 2 tablets on the first day of flare 30 tablet 0  . glucose blood test strip E11.65 Use as instructed to check sugars three time daily and as needed 100 each 12  . HUMALOG KWIKPEN 100 UNIT/ML KwikPen     . hydrocortisone cream 1 % Apply topically.    . insulin aspart (NOVOLOG FLEXPEN) 100 UNIT/ML FlexPen Use three times daily with meals as needed per sliding scale: 2 units for every 50 units over 150 (2u for 150-199, 4u for 200-249, 6u  for 250-299, etc) 3 mL 1  . ketoconazole (NIZORAL) 2 % shampoo Apply topically.    Marland Kitchen levothyroxine (SYNTHROID) 137 MCG tablet TAKE 1 TABLET BY MOUTH ONCE DAILY BEFORE BREAKFAST (Patient taking differently: Take 137 mcg by mouth daily before breakfast. ) 30 tablet 11  . methylphenidate (RITALIN) 5 MG tablet Take 1 tablet (5 mg total) by mouth 2 (two) times daily with breakfast and lunch. 60 tablet 0  . metoprolol succinate (TOPROL-XL) 50 MG 24 hr tablet TAKE 1 AND 1/2 TABLET (75 MG) BY MOUTH EVERY DAY WITH OR IMMEDIATELY FOLLOWING A MEAL *DO NOT CRUSH* (Patient taking differently: Take 50 mg by mouth daily. ) 50 tablet 11  . mineral oil-hydrophilic petrolatum (AQUAPHOR) ointment Apply topically.    . polyethylene glycol (MIRALAX / GLYCOLAX) 17 g packet Take 17 g by mouth 2 (two) times daily. (Patient taking differently: Take 17 g by mouth daily. ) 14 each 0  . senna (SENOKOT) 8.6 MG TABS tablet Take 1 tablet (8.6 mg total) by mouth at bedtime. 120 tablet 0  . senna-docusate (SENOKOT-S) 8.6-50 MG tablet Take by mouth.    . sertraline (ZOLOFT) 25 MG tablet Take 1 tablet (25 mg total) by mouth daily. 5 tablet 0  . sertraline (ZOLOFT) 50 MG tablet Take 1 tablet (50 mg total) by mouth daily. Take with breakfast.  Initiate 29m dose after completing 5 days of 280m. 90 tablet 3  . testosterone cypionate (DEPOTESTOSTERONE CYPIONATE) 200  MG/ML injection Inject 100 mg into the muscle every 14 (fourteen) days.    . traZODone (DESYREL) 50 MG tablet Take 0.5-1 tablets (25-50 mg total) by mouth at bedtime. (Patient taking differently: Take 25 mg by mouth at bedtime as needed for sleep. ) 30 tablet 1  . tretinoin (VESANOID) 10 MG capsule     . Turmeric Curcumin 500 MG CAPS Take by mouth.    . carboxymethylcellulose (REFRESH TEARS) 0.5 % SOLN Apply to eye.    . Lancets (ONETOUCH ULTRASOFT) lancets E11.65 Use as instructed to check sugars three time daily and as needed (Patient not taking: Reported on 06/18/2019) 100 each 12  . ondansetron (ZOFRAN-ODT) 4 MG disintegrating tablet Take 1 tablet (4 mg total) by mouth every 8 (eight) hours as needed for nausea or vomiting. (Patient not taking: Reported on 06/18/2019) 60 tablet 2  . sodium chloride (ALTAMIST SPRAY) 0.65 % nasal spray Place into the nose.     No current facility-administered medications for this visit.   Facility-Administered Medications Ordered in Other Visits  Medication Dose Route Frequency Provider Last Rate Last Admin  . 0.9 %  sodium chloride infusion  1,000 mL Intravenous Once FeTruitt MerleMD      . arsenic trioxide (TRISENOX) 13.3 mg in sodium chloride 0.9 % 250 mL chemo Infusion  0.15 mg/kg/day (Treatment Plan Recorded) Intravenous Daily FiLloyd HugerMD   Stopped at 04/21/19 1404  . arsenic trioxide (TRISENOX) 13.3 mg in sodium chloride 0.9 % 250 mL chemo Infusion  0.15 mg/kg/day (Treatment Plan Recorded) Intravenous Daily FiLloyd HugerMD 132 mL/hr at 05/09/19 1340 13.3 mg at 05/09/19 1340  . sodium chloride flush (NS) 0.9 % injection 10 mL  10 mL Intracatheter PRN FiLloyd HugerMD   10 mL at 05/09/19 1318    OBJECTIVE: Vitals:   06/19/19 0941  BP: (!) 132/92  Pulse: 75  Temp: (!) 95.1 F (35.1 C)     Body mass index is 28.79 kg/m.    ECOG FS:2 -  Symptomatic, <50% confined to bed  General: Thin, no acute distress.  Sitting in a  wheelchair. Eyes: Pink conjunctiva, anicteric sclera. HEENT: Normocephalic, moist mucous membranes. Lungs: No audible wheezing or coughing. Heart: Regular rate and rhythm. Abdomen: Soft, nontender, no obvious distention. Musculoskeletal: No edema, cyanosis, or clubbing. Neuro: Alert, answering all questions appropriately. Cranial nerves grossly intact. Skin: No rashes or petechiae noted. Psych: Normal affect.   LAB RESULTS:  Lab Results  Component Value Date   NA 137 06/19/2019   K 4.0 06/19/2019   CL 105 06/19/2019   CO2 22 06/19/2019   GLUCOSE 99 06/19/2019   BUN 40 (H) 06/19/2019   CREATININE 1.57 (H) 06/19/2019   CALCIUM 9.3 06/19/2019   PROT 6.2 (L) 06/19/2019   ALBUMIN 3.6 06/19/2019   AST 18 06/19/2019   ALT 13 06/19/2019   ALKPHOS 52 06/19/2019   BILITOT 0.5 06/19/2019   GFRNONAA 41 (L) 06/19/2019   GFRAA 48 (L) 06/19/2019    Lab Results  Component Value Date   WBC 4.4 06/19/2019   NEUTROABS 3.0 06/19/2019   HGB 13.0 06/19/2019   HCT 40.3 06/19/2019   MCV 101.8 (H) 06/19/2019   PLT 150 06/19/2019     STUDIES: No results found.  ASSESSMENT: Acute promyelocytic leukemia in remission  PLAN:    1. Acute promyelocytic leukemia in remission: Patient completed his induction chemotherapy at [facility] in approximately November 2020.  Peripheral blood FISH-PML t(15:17) noted complete molecular remission.  Patient completed 1 cycle of maintenance chemotherapy consisting of ATRA 14 days on 14 days off along with arsenic trioxide at 0.15 mg/kg/day Monday through Friday for 28 days.  After lengthy discussion with the patient, he is not happy with his current quality of life and wishes to discontinue treatment at this time.  Continue to monitor peripheral blood for recurrence.  No further intervention is needed.  Return to clinic in 1 month with repeat laboratory work and further evaluation.  Appreciate palliative care input.    2.  Nausea: Patient does not  complain of this today.  Continue ondansetron as needed. 3.  Rash: Patient does not complain of this today. 4.  Cardiac disease/biventricular pacer: Given patient's biventricular pacing, he has a significantly abnormal EKG with a prolonged QTC of approximately 560. Continue close follow-up with cardiology as scheduled.  5.  Melanoma: No obvious evidence of recurrence.  Monitor.  Continue follow-up with neuro-oncology as scheduled. 6.  Prostate cancer: Patient's most recent PSA in June 2020 was reported at 4.35. 7.  Weakness and fatigue: Likely multifactorial.  Discontinue maintenance treatment as above.   8.  Renal insufficiency: Chronic and unchanged.  Patient creatinine has slightly improved to 1.57.  9.  Leukopenia: Resolved. 10.  Confusion: Patient cannot get an MRI given his biventricular pacer, but CT scan on May 14, 2019 revealed no evidence of recurrent or metastatic disease.  Continue follow-up with neuro oncology as scheduled.    Patient expressed understanding and was in agreement with this plan. He also understands that He can call clinic at any time with any questions, concerns, or complaints.     [name], MD   06/19/2019 11:53 AM

## 2019-06-19 NOTE — Telephone Encounter (Signed)
Rec'd call from patient's daughter Silas [place] wanting to let me know that her Mom has Alzheimer's and when we call tell her that we need to speak with Ulice Dash and where we are calling from.  Thanked daughter for the information and I will f/u with the patient tomorrow morning

## 2019-06-19 NOTE — Telephone Encounter (Signed)
ERx 

## 2019-06-19 NOTE — Telephone Encounter (Signed)
Spoke with patient's wife Geni Bers regarding Palliative referral and also let her know that I just spoke with their [name] as well and let her know that she wanted to be present for the Consult.  I explained to wife what Palliative services were and what we would be doing and she requested that I call her back tomorrow because she had just gotten up from a nap and wasn't thinking to clearly at the moment.  I agreed to f/u with her tomorrow.  I then called daughter back and left message letting her know that her Mom had requested that I call them back tomorrow.

## 2019-06-19 NOTE — Progress Notes (Signed)
[place]  Telephone:([phone] Fax:([phone]   Name: David [name]. Date: 06/19/2019 MRN: [id number]  DOB: January 09, 1941  Patient Care Team: [name], MD as PCP - General (Family Medicine) Minna Merritts, MD as PCP - Cardiology (Cardiology) Deboraha Sprang, MD as PCP - Electrophysiology (Cardiology) Cira Rue, RN Nurse Navigator as Registered Nurse (Medical Oncology) Acquanetta Chain, DO as Consulting Physician [facility] and Palliative Medicine) [name], MD as Consulting Physician (Hematology and Oncology)    REASON FOR CONSULTATION: Latrelle Bazar. is a 79 y.o. male with multiple medical problems including acute promyelocytic leukemia in remission on current treatment with arsenic and ATRA.  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  Patient follows at [facility] for his APML but receives treatment locally.  He has had fatigue.  Patient was referred to palliative care to help address goals and manage ongoing symptoms.   SOCIAL HISTORY:     reports that he quit smoking about 62 years ago. He has a 3.00 pack-year smoking history. He has never used smokeless tobacco. He reports current alcohol use of about 14.0 standard drinks of alcohol per week. He reports that he does not use drugs.   Patient is married and lives at home with his wife.  They live at [facility] in an apartment.  He has a son in [place] and a daughter in [place].  Patient is a retired Pharmacist, community.  ADVANCE DIRECTIVES:  On file  CODE STATUS: DNR/DNI (MOST form on file)  PAST MEDICAL HISTORY: Past Medical History:  Diagnosis Date  . Anginal pain ([place])   . Arthritis    mostly hands  . Benign familial hematuria   . BPH (benign prostatic hypertrophy)   . Brain cancer ([place])    melanoma with brain met    . Coronary artery disease   . GERD (gastroesophageal reflux disease)   . High cholesterol   . Hypertension   . Hypothyroidism   . Intracerebral hemorrhage ([place])   . Melanoma ([place])   . Metastatic melanoma to lung ([place])   . Mood disorder ([place])   . Myocardial infarction ([place])   . Pacemaker    due to syncope, 3rd degree HB (upgrade to [facility]. Jude CRT-P 02/25/13 (Dr. Uvaldo Rising)  . Rectal bleed    due to NSAIDS  . Renal disorder   . Sepsis ([place]) 12/09/2018  . Skin cancer    melanoma  . Sleep apnea    uses CPAP  . Stroke [facility])     PAST SURGICAL HISTORY:  Past Surgical History:  Procedure Laterality Date  . APPLICATION OF CRANIAL NAVIGATION N/A 10/15/2015   Procedure: APPLICATION OF CRANIAL NAVIGATION;  Surgeon: Erline Levine, MD;  Location: [facility] NEURO ORS;  Service: Neurosurgery;  Laterality: N/A;  . BIV PACEMAKER GENERATOR CHANGEOUT N/A 04/02/2019   Procedure: BIV PACEMAKER GENERATOR CHANGEOUT;  Surgeon: Evans Lance, MD;  Location: [facility] CV LAB;  Service: Cardiovascular;  Laterality: N/A;  . CARDIAC CATHETERIZATION  2013  . CARDIAC SURGERY     bypass X 2  . COLONOSCOPY WITH PROPOFOL N/A 05/08/2016   diverticulosis, int hem, no f/u needed [name])  . CORONARY ARTERY BYPASS GRAFT  2013   LIMA-LAD, SVG-PDA 03/27/11 (Dr. Francee Gentile)  . CRANIOTOMY N/A 10/15/2015   Procedure: LEFT FRONTAL CRANIOTOMY TUMOR EXCISION with Curve;  Surgeon: Erline Levine, MD;  Location: [facility] NEURO ORS;  Service: Neurosurgery;  Laterality: N/A;  CRANIOTOMY TUMOR EXCISION  . EYE SURGERY    . FOOT SURGERY  08/2010  . JOINT REPLACEMENT Left    partial knee  . KNEE ARTHROSCOPY  12/2008  . LYMPH NODE BIOPSY    . PACEMAKER INSERTION    . TONSILLECTOMY      HEMATOLOGY/ONCOLOGY HISTORY:  Oncology History Overview Note  Metastatic melanoma ([place])   Staging form: Melanoma of the Skin, AJCC 7th Edition     Clinical stage from 09/21/2015: Stage IV [place], [place], [place]) - Signed by [name], MD on 10/09/2015     Malignant melanoma  ([place])  09/16/2015 Imaging   PET scan showed a hypermetabolic 3.8GY lingular nodule, and a 84m right upper lobe lung nodule, left subclavicular nodes, and bone metastasis in the proximal right humerus and right femur.    09/21/2015 Initial Diagnosis   Metastatic melanoma ([place]   09/21/2015 Initial Biopsy   Left subclavicular lymph node biopsy showed prostatic of melanoma   09/21/2015 Miscellaneous   BRAF V600K mutation (+)    10/04/2015 Imaging   CT head with without contrast showed a 2.0 x 1.6 x 1.7 cm superficial left frontal hyperdense lesion, with postcontrast enhancement, lying within the previous hemorrhagic area, concerning for solitary melanoma metastasis   10/06/2015 - 09/06/2016 Chemotherapy   Nivolumab [id number] 2 weeks, held due to his dexamethasone for radionecrosis    10/13/2015 - 10/13/2015 Radiation Therapy   10/13/2015 Preop SRS treatment: 14 Gy  to the left frontal lesion in 1 fraction   10/15/2015 Surgery   left front craniotomy and tumor excision    10/15/2015 Pathology Results   left frontal brain mass excision showed metastatic melanoma, 1.5X1.5X0.6cm   01/13/2016 Imaging   CT head without and with contrast showed ill-defined enhancement and dural thickening of the left frontal resection cavity may represent a post treatment changes or residual neoplasm. A new 6 mm hypodensity in the right cerebellar hemisphere with uncertainty enhancement may represent a metastasis or small brain parenchymal hemorrhage.    01/27/2016 - 01/27/2016 Radiation Therapy   01/27/16 SRS Treatment:  20 Gy in 1 fraction to a 6 mm right cerebellar brain met    02/2016 - 05/24/2018 Chemotherapy   Monthly Xgeva starting 02/2016, changed to every 3 months on 01/30/2018. Stopped on 05/24/2018   03/29/2016 PET scan   PET 03/29/2016 IMPRESSION: 1. Overall there has been an interval response to therapy. The index lesions sided on previous exam it are less hypermetabolic when compared with the previous  exam. 2. There is an intramuscular lesion within the right masseter which has increased FDG uptake compared with previous exam. 3. No new sites of disease identified. 4. Aortic atherosclerosis and infrarenal abdominal aortic aneurysm. Recommend followup by ultrasound in 3 years. This recommendation follows ACR consensus guidelines: White Paper of the ACR Incidental Findings Committee II on Vascular Findings. J Joellyn Ruedadiol 2013; 10:789-794   04/20/2016 Imaging   CT Head w wo contrast 1. Mild patchy enhancement at the left anterior frontal lobe treatment site (series 11, image 39) without mass effect and stable surrounding white matter hypodensity without strong evidence of acute vasogenic edema. Recommend continued CT surveillance. 2. No new metastatic disease or new intracranial abnormality identified. 3. Probable benign 14 mm left parietal bone lucent lesion, unchanged since May 2017.   05/06/2016 - 05/08/2016 Hospital Admission   Pt was admitted for rectal bleeding for one  day. Breathing spontaneously resolved, hemoglobin dropped from 15 to 10.5, did not require blood transfusion. Colonoscopy showed no active bleeding, except diverticulosis and hemorrhoid. He was discharged to home.   05/08/2016 Procedure   Colonoscopy  - Stool in the ascending colon. Fluid aspiration performed. - Diverticulosis in the entire examined colon. - Non-bleeding internal hemorrhoids. - The examination was otherwise normal on direct and retroflexion views.   05/29/2016 Imaging   PET Image Restaging IMPRESSION: 1. Stable exam with no evidence progression. 2. Photopenia in the LEFT frontal lobe at prior surgical site. 3. Stable mildly hypermetabolic RIGHT paratracheal node. This may represent a thyroid nodule. 4. Stable size of minimally metabolic lingular pulmonary nodule. 5. Stable skeletal lesion of the RIGHT iliac crest 6. No cutaneous metabolic lesion.   07/20/2016 Imaging   CT Head W WO  Contrast IMPRESSION: Pronounced change in the left frontal region. Much more regional vasogenic edema. Relatively stable appearance of the abnormal enhancement extending from the lateral margin of the frontal horn of left lateral ventricle to the frontal brain surface. New region of abnormal enhancement surrounding the frontal horn of the left lateral ventricle measuring approximately 3.2 cm. The differential diagnosis is recurrent tumor versus radiation necrosis. The patient is reportedly not an MRI candidate. This area is large enough that CT perfusion could possibly make a reasonable differentiation.   09/02/2016 Imaging   CT Head 09/02/16 IMPRESSION: Somewhat mixed pattern of slight posterior progression of pleomorphic enhancement surrounding the LEFT lateral ventricle although no significant mass effect, and reduced vasogenic edema compared with the scan in April.   09/27/2016 - 11/02/2016 Chemotherapy   Start dabrafenib and trametinib 09/27/16, stopped due to severe fatigue     10/05/2016 Imaging   CT Head w & w/o contrast  IMPRESSION: 1. Decreased size of the enhancing area within the left frontal lobe treatment site. Surrounding edema has also decreased. The findings support continued evolution of radiation necrosis. 2. No new enhancing lesions.   10/18/2016 PET scan   IMPRESSION: 1. New focal hypermetabolism in the proximal sigmoid colon with associated focal colonic wall thickening and mild pericolonic fat stranding at the site of a proximal sigmoid diverticulum, favored to represent mild acute sigmoid diverticulitis. Metastatic disease is considered less likely. Clinical correlation is necessary. Consider appropriate antibiotic therapy, as clinically warranted. Attention to this region recommended on future PET-CT follow-up. 2. Otherwise complete metabolic response with no evidence of hypermetabolic metastatic disease. Lingular pulmonary nodule is slightly decreased in  size and demonstrates no significant metabolism. 3. No residual hypermetabolism in the right thyroid lobe nodule, which is stable in size. 4. Aortic Atherosclerosis (ICD10-I70.0). Stable 3.1 cm infrarenal abdominal aortic aneurysm. Aortic aneurysm NOS (ICD10-I71.9). Recommend followup by ultrasound in 3 years. This recommendation follows ACR consensus guidelines: White Paper of the ACR Incidental Findings Committee II on Vascular Findings. Joellyn Rued Radiol 2013; 07:867-544.   11/01/2016 - 11/04/2016 Hospital Admission   Patient admitted to the hospital due to hypercalcemia where he received IV fluids and pamidronate   12/13/2016 PET scan   PET 12/13/16 IMPRESSION: 1. No findings to suggest metastatic disease on today's examination. 2. Previously noted hypermetabolism in the sigmoid colon has resolved, presumably indicative of a focus of diverticulitis on the prior study. Today's study does demonstrate relatively diffuse hypermetabolism throughout the cecum, ascending colon and proximal transverse colon where there is some mild mural thickening, favored to reflect mild chronic inflammation related to underlying diverticular disease. No definite inflammatory changes on the CT portion  of the examination to strongly suggest an active acute diverticulitis at this time. No discrete lesion to suggest neoplasm. 3. Previously noted 8 mm lingular nodule is stable in size and known remains non hypermetabolic. This is nonspecific but reassuring. Continued attention on future followup studies is recommended. 4. Aortic atherosclerosis, in addition to left main and 3 vessel coronary artery disease. Status post median sternotomy for CABG including LIMA to the LAD. In addition, there is mild fusiform aneurysmal dilatation of the infrarenal abdominal aorta (3.1 cm in diameter) as well as aneurysmal dilatation of the left common iliac   12/18/2016 - 12/31/2017 Chemotherapy   Restarted nivolamab 269m every  2 weeks, with tapering dose dexamethasone     04/18/2017 Imaging   PET Scan IMPRESSION: 1. No hypermetabolic lesion to suggest active malignancy. 2. Stable 8 mm lingular nodule is not currently hypermetabolic but merit surveillance. Infrarenal abdominal aortic aneurysm 3.1 cm in diameter. Recommend followup by [facility] 3 years. 3. Other imaging findings of potential clinical significance: Postoperative findings in the left frontal lobe. Right renal cyst. Aortic Atherosclerosis (ICD10-I70.0). Osteoarthritis. Lumbar scoliosis. Left knee effusion. Colonic diverticulosis.    10/10/2017 PET scan   IMPRESSION: 1. Small focus of FDG accumulation in the subcutaneous fat of the right gluteal region, nonspecific. Attention on follow-up suggested. 2. Otherwise stable exam without new or progressive hypermetabolic disease in the interval since prior PET-CT. 3. Stable 8 mm nodule in the lingula. No evidence for hypermetabolism on PET imaging. 4.  Aortic Atherosclerois (ICD10-170.0)    01/29/2018 PET scan   IMPRESSION: 1. Small focus of hypermetabolism in the lower left paratracheal neck, which appears to localize to the posterior left thyroid lobe, without discrete thyroid nodule on the CT images, decreased in metabolism since 10/10/2017 PET-CT. This finding is nonspecific and may represent a hypermetabolic thyroid nodule. Consider thyroid ultrasound correlation. 2. No new hypermetabolic findings suspicious for metastatic disease. Previously described low level metabolism in the subcutaneous right gluteal region has resolved, compatible with resolved inflammatory focus. No evidence of recurrent hypermetabolism within the stable lingular pulmonary nodule. No evidence of recurrent osseous hypermetabolism. 3. Stable infrarenal 3.4 cm Abdominal Aortic Aneurysm (ICD10-I71.9). Recommend follow-up aortic ultrasound in 3 years. This recommendation follows ACR consensus guidelines: White Paper of the ACR  Incidental Findings Committee II on Vascular Findings. J Am Coll Radiol 2013;; 54:270-623 4.  Aortic Atherosclerosis (ICD10-I70.0).   09/18/2018 PET scan   PET 09/18/18  IMPRESSION: 1. No findings of active malignancy. The known small right cerebellar mass is not readily apparent on PET-CT but was shown on today's CT head. 2. 3.2 cm infrarenal abdominal aortic aneurysm. Recommend followup by [facility] 3 years. This recommendation follows ACR consensus guidelines: White Paper of the ACR Incidental Findings Committee II on Vascular Findings. J Am Coll Radiol 2013; 10:789-794. Aortic aneurysm NOS (ICD10-I71.9). 3. Other imaging findings of potential clinical significance: Left frontal lobe encephalomalacia underlying the craniotomy site. Chronic paranasal sinusitis. Aortic Atherosclerosis (ICD10-I70.0). Coronary atherosclerosis. Mild cardiomegaly. Colonic diverticulosis. Dextroconvex lumbar scoliosis. Small left knee effusion with mild synovitis. Postoperative findings in the right foot.   09/18/2018 Imaging   CT head  IMPRESSION: 1. No evidence of new intracranial metastases or acute abnormality. 2. Unchanged 4 mm right cerebellar metastasis. 3. Stable post treatment changes in the left frontal lobe.   APML (acute promyelocytic leukemia) in remission ([place]  01/08/2019 Initial Diagnosis   APML (acute promyelocytic leukemia) in remission ([place]   04/14/2019 -  Chemotherapy   The patient  had palonosetron (ALOXI) injection 0.25 mg, 0.25 mg, Intravenous,  Once, 2 of 4 cycles Administration: 0.25 mg (04/15/2019), 0.25 mg (04/17/2019), 0.25 mg (04/21/2019), 0.25 mg (04/23/2019), 0.25 mg (04/25/2019), 0.25 mg (04/28/2019), 0.25 mg (04/30/2019), 0.25 mg (05/02/2019), 0.25 mg (05/05/2019), 0.25 mg (05/07/2019), 0.25 mg (05/09/2019), 0.25 mg (04/18/2019) arsenic trioxide (TRISENOX) 13.3 mg in sodium chloride 0.9 % 250 mL chemo Infusion, 0.15 mg/kg = 13.3 mg (100 % of original dose 0.15 mg/kg), Intravenous,  Once, 2 of  4 cycles Dose modification: 0.15 mg/kg/day (original dose 0.15 mg/kg, Cycle 1, Reason: Other (see comments)), 0.15 mg/kg (original dose 0.15 mg/kg, Cycle 1, Reason: Other (see comments), Comment: change to mg/kg) Administration: 13.3 mg (04/21/2019), 13.3 mg (04/22/2019), 13.3 mg (04/23/2019), 13.3 mg (04/24/2019), 13.3 mg (04/25/2019), 13.3 mg (04/28/2019), 13.3 mg (04/29/2019), 13.3 mg (04/30/2019), 13.3 mg (05/01/2019), 13.3 mg (05/02/2019), 13.3 mg (05/05/2019), 13.3 mg (05/06/2019), 13.3 mg (05/07/2019), 13.3 mg (05/08/2019), 13.3 mg (05/09/2019)  for chemotherapy treatment.      ALLERGIES:  is allergic to ciprofloxacin; ezetimibe; and simvastatin.  MEDICATIONS:  Current Outpatient Medications  Medication Sig Dispense Refill  . acetaminophen (TYLENOL) 325 MG tablet Take 2 tablets (650 mg total) by mouth every 6 (six) hours as needed for mild pain (or Fever >/= 101).    Marland Kitchen acyclovir (ZOVIRAX) 400 MG tablet Take 400 mg by mouth 2 (two) times daily.    Marland Kitchen allopurinol (ZYLOPRIM) 100 MG tablet Take 2 tablets (200 mg total) by mouth daily. 180 tablet 1  . aspirin EC 81 MG tablet Take 81 mg by mouth every evening.     . Blood Glucose Monitoring Suppl (ONETOUCH VERIO) w/Device KIT 1 kit by Does not apply route as directed. E11.65 1 kit 0  . carboxymethylcellulose (REFRESH TEARS) 0.5 % SOLN Apply to eye.    . Cholecalciferol (VITAMIN D3) 25 MCG (1000 UT) CAPS Take 1 capsule (1,000 Units total) by mouth daily. 30 capsule 0  . ciprofloxacin (CILOXAN) 0.3 % ophthalmic solution Place 2 drops into the left eye every 2 (two) hours while awake. Administer 1 drop, every 2 hours, while awake, for 2 days. Then 1 drop, every 4 hours, while awake, for the next 5 days. 5 mL 0  . colchicine 0.6 MG tablet Take 1 tablet (0.6 mg total) by mouth daily as needed (gout flare). Take 2 tablets on the first day of flare 30 tablet 0  . glucose blood test strip E11.65 Use as instructed to check sugars three time daily and as needed 100 each  12  . HUMALOG KWIKPEN 100 UNIT/ML KwikPen     . hydrocortisone cream 1 % Apply topically.    . insulin aspart (NOVOLOG FLEXPEN) 100 UNIT/ML FlexPen Use three times daily with meals as needed per sliding scale: 2 units for every 50 units over 150 (2u for 150-199, 4u for 200-249, 6u for 250-299, etc) 3 mL 1  . ketoconazole (NIZORAL) 2 % shampoo Apply topically.    . Lancets (ONETOUCH ULTRASOFT) lancets E11.65 Use as instructed to check sugars three time daily and as needed (Patient not taking: Reported on 06/18/2019) 100 each 12  . levothyroxine (SYNTHROID) 137 MCG tablet TAKE 1 TABLET BY MOUTH ONCE DAILY BEFORE BREAKFAST (Patient taking differently: Take 137 mcg by mouth daily before breakfast. ) 30 tablet 11  . methylphenidate (RITALIN) 5 MG tablet Take 1 tablet (5 mg total) by mouth 2 (two) times daily with breakfast and lunch. 60 tablet 0  . metoprolol succinate (TOPROL-XL) 50 MG 24  hr tablet TAKE 1 AND 1/2 TABLET (75 MG) BY MOUTH EVERY DAY WITH OR IMMEDIATELY FOLLOWING A MEAL *DO NOT CRUSH* (Patient taking differently: Take 50 mg by mouth daily. ) 50 tablet 11  . mineral oil-hydrophilic petrolatum (AQUAPHOR) ointment Apply topically.    . ondansetron (ZOFRAN-ODT) 4 MG disintegrating tablet Take 1 tablet (4 mg total) by mouth every 8 (eight) hours as needed for nausea or vomiting. (Patient not taking: Reported on 06/18/2019) 60 tablet 2  . polyethylene glycol (MIRALAX / GLYCOLAX) 17 g packet Take 17 g by mouth 2 (two) times daily. (Patient taking differently: Take 17 g by mouth daily. ) 14 each 0  . senna (SENOKOT) 8.6 MG TABS tablet Take 1 tablet (8.6 mg total) by mouth at bedtime. 120 tablet 0  . senna-docusate (SENOKOT-S) 8.6-50 MG tablet Take by mouth.    . sertraline (ZOLOFT) 25 MG tablet Take 1 tablet (25 mg total) by mouth daily. 5 tablet 0  . sertraline (ZOLOFT) 50 MG tablet Take 1 tablet (50 mg total) by mouth daily. Take with breakfast.  Initiate 8m dose after completing 5 days of 244m.  90 tablet 3  . sodium chloride (ALTAMIST SPRAY) 0.65 % nasal spray Place into the nose.    . testosterone cypionate (DEPOTESTOSTERONE CYPIONATE) 200 MG/ML injection Inject 100 mg into the muscle every 14 (fourteen) days.    . traZODone (DESYREL) 50 MG tablet Take 0.5-1 tablets (25-50 mg total) by mouth at bedtime. (Patient taking differently: Take 25 mg by mouth at bedtime as needed for sleep. ) 30 tablet 1  . tretinoin (VESANOID) 10 MG capsule     . Turmeric Curcumin 500 MG CAPS Take by mouth.     No current facility-administered medications for this visit.   Facility-Administered Medications Ordered in Other Visits  Medication Dose Route Frequency Provider Last Rate Last Admin  . 0.9 %  sodium chloride infusion  1,000 mL Intravenous Once FeTruitt MerleMD      . arsenic trioxide (TRISENOX) 13.3 mg in sodium chloride 0.9 % 250 mL chemo Infusion  0.15 mg/kg/day (Treatment Plan Recorded) Intravenous Daily FiLloyd HugerMD   Stopped at 04/21/19 1404  . arsenic trioxide (TRISENOX) 13.3 mg in sodium chloride 0.9 % 250 mL chemo Infusion  0.15 mg/kg/day (Treatment Plan Recorded) Intravenous Daily FiLloyd HugerMD 132 mL/hr at 05/09/19 1340 13.3 mg at 05/09/19 1340  . heparin lock flush 100 unit/mL  500 Units Intravenous Once FiLloyd HugerMD      . sodium chloride flush (NS) 0.9 % injection 10 mL  10 mL Intracatheter PRN FiLloyd HugerMD   10 mL at 05/09/19 1318    VITAL SIGNS: There were no vitals taken for this visit. There were no vitals filed for this visit.  Estimated body mass index is 28.79 kg/m as calculated from the following:   Height as of 05/27/19: 5' 9.5" (1.765 m).   Weight as of an earlier encounter on 06/19/19: 197 lb 12.8 oz (89.7 kg).  LABS: CBC:    Component Value Date/Time   WBC 4.4 06/19/2019 0918   HGB 13.0 06/19/2019 0918   HGB 14.0 05/24/2018 1445   HGB 15.8 03/26/2017 0928   HCT 40.3 06/19/2019 0918   HCT 46.9 03/26/2017 0928   PLT 150  06/19/2019 0918   PLT 153 05/24/2018 1445   PLT 158 03/26/2017 0928   MCV 101.8 (H) 06/19/2019 0918   MCV 92.6 03/26/2017 0928   NEUTROABS 3.0  06/19/2019 0918   NEUTROABS 3.4 03/26/2017 0928   LYMPHSABS 0.5 (L) 06/19/2019 0918   LYMPHSABS 0.6 (L) 03/26/2017 0928   MONOABS 0.5 06/19/2019 0918   MONOABS 0.6 03/26/2017 0928   EOSABS 0.4 06/19/2019 0918   EOSABS 0.3 03/26/2017 0928   BASOSABS 0.0 06/19/2019 0918   BASOSABS 0.0 03/26/2017 0928   Comprehensive Metabolic Panel:    Component Value Date/Time   NA 137 06/19/2019 0918   NA 140 03/26/2017 0928   K 4.0 06/19/2019 0918   K 4.7 03/26/2017 0928   CL 105 06/19/2019 0918   CO2 22 06/19/2019 0918   CO2 21 (L) 03/26/2017 0928   BUN 40 (H) 06/19/2019 0918   BUN 19.3 03/26/2017 0928   CREATININE 1.57 (H) 06/19/2019 0918   CREATININE 1.77 (H) 05/24/2018 1445   CREATININE 1.2 03/26/2017 0928   GLUCOSE 99 06/19/2019 0918   GLUCOSE 95 03/26/2017 0928   CALCIUM 9.3 06/19/2019 0918   CALCIUM 8.7 03/26/2017 0928   AST 18 06/19/2019 0918   AST 18 05/24/2018 1445   AST 22 03/26/2017 0928   ALT 13 06/19/2019 0918   ALT 13 05/24/2018 1445   ALT 15 03/26/2017 0928   ALKPHOS 52 06/19/2019 0918   ALKPHOS 58 03/26/2017 0928   BILITOT 0.5 06/19/2019 0918   BILITOT 0.6 05/24/2018 1445   BILITOT 0.58 03/26/2017 0928   PROT 6.2 (L) 06/19/2019 0918   PROT 6.8 03/26/2017 0928   ALBUMIN 3.6 06/19/2019 0918   ALBUMIN 3.7 03/26/2017 0928    RADIOGRAPHIC STUDIES: No results found.  PERFORMANCE STATUS (ECOG) : 2 - Symptomatic, <50% confined to bed  Review of Systems Unless otherwise noted, a complete review of systems is negative.  Physical Exam General: NAD, frail appearing, thin Pulmonary: Unlabored Extremities: no edema, no joint deformities Skin: no rashes Neurological: Weakness but otherwise nonfocal  IMPRESSION: Patient was an add-on today after his visit with Dr. Grayland Ormond.  Patient is decided to forego future treatment.   He says "if it gets, me it gets me."  Patient says that he would prefer to focus on quality of life and make whatever time he has left the best it can be.  Symptomatically, he is unchanged.  He remains frail and weak.  He reports stable moods but there has been some concern that depression could be exacerbating his symptoms.  I previously rotated him from citalopram to escitalopram it appears he is now on sertraline.  Patient is being referred to psychiatry with Dr. [name].  I called and spoke with patient's daughter by phone.  She verbalized agreement with the plan.  Will refer to outpatient palliative care and would consider transition to hospice when patient is declining.  PLAN: -Best supportive care -Continue sertraline.  Agree with psychiatry referral -Could consider dose increase of methylphenidate if needed for fatigue -Ambulatory referral to palliative care -Would have a low threshold for hospice referral in the setting of decline -RTC in 1 month   Patient expressed understanding and was in agreement with this plan. He also understands that He can call the clinic at any time with any questions, concerns, or complaints.     Time Total: 20 minutes  Visit consisted of counseling and education dealing with the complex and emotionally intense issues of symptom management and palliative care in the setting of serious and potentially life-threatening illness.Greater than 50%  of this time was spent counseling and coordinating care related to the above assessment and plan.  Signed by: Altha Harm,  PhD, NP-C

## 2019-06-19 NOTE — Telephone Encounter (Signed)
Called and spoke with patient's [name], regarding Palliative referral (and she was aware of this) and to find out if she would like to be present for the Initial Consult and she said she would.  We discussed the tentative date and time.  I told her that I would contact her Dad to get this scheduled and she was in agreement with this.

## 2019-06-20 ENCOUNTER — Inpatient Hospital Stay: Payer: Medicare Other

## 2019-06-20 ENCOUNTER — Telehealth: Payer: Self-pay | Admitting: Adult Health Nurse Practitioner

## 2019-06-20 NOTE — Telephone Encounter (Signed)
Spoke with patient regarding Palliative services and answered all questions and he was in agreement with this.  I have scheduled an In-person Consult for 06/24/19 @ 11 AM

## 2019-06-23 ENCOUNTER — Inpatient Hospital Stay: Payer: Medicare Other

## 2019-06-23 ENCOUNTER — Inpatient Hospital Stay: Payer: Medicare Other | Admitting: Oncology

## 2019-06-23 ENCOUNTER — Inpatient Hospital Stay: Payer: Medicare Other | Admitting: Hospice and Palliative Medicine

## 2019-06-24 ENCOUNTER — Other Ambulatory Visit: Payer: Medicare Other | Admitting: Adult Health Nurse Practitioner

## 2019-06-24 ENCOUNTER — Other Ambulatory Visit: Payer: Self-pay

## 2019-06-24 ENCOUNTER — Inpatient Hospital Stay: Payer: Medicare Other

## 2019-06-24 DIAGNOSIS — C9240 Acute promyelocytic leukemia, not having achieved remission: Secondary | ICD-10-CM

## 2019-06-24 DIAGNOSIS — Z515 Encounter for palliative care: Secondary | ICD-10-CM | POA: Diagnosis not present

## 2019-06-24 NOTE — Progress Notes (Signed)
[facility] Consult Note Telephone: [phone]  Fax: [phone]  PATIENT NAME: David [name]. DOB: November 03, 1940 MRN: [id number]  PRIMARY CARE PROVIDER:   [name], MD  REFERRING PROVIDER:  Billey Chang NP  RESPONSIBLE PARTY:   [name], daughter  [phone] Patient's home phone [phone]  Cell # [phone]    RECOMMENDATIONS and PLAN:  1.  Advanced care planning.  Patient is a DNR/DNI.  MOST form in Epic.  2.  Acute promyelocytic leukemia. Patient had undergone 41 days of induction chemotherapy fall of last year.  He started consolidation chemo with arsenic and ATRA December of last year and completed one 8 week cycle.  He started feeling weak and tired about 4-5 weeks ago after the first cycle.  He does not want to continue any more treatment.  He currently is in remission but there is no research to know how long he could be in remission after going through one cycle of consolidation chemo when the typical treatment is to go through 4 cycles.  Daughter  wants to continue with follow up with oncology to monitor for disease recurrence.  Patient is okay with this. Continue follow up with oncology as long as patient is in agreement.  3.  Functional status.  Patient is able to ambulate with a walker.  States that up until about 4-5 weeks he was able to walk the full loop at the [place] around his house. Now he has to take frequent breaks due to weakness.  Mostly independent with ADLs but recently has required help with tasks that require bending such as pulling on pants.  PT has been ordered.  Patient states that he will try PT but does not know if it is something he will continue.  Have encouraged to work with PT and continue exercises on his own to help with his weakness.  He said he would try.  This will require ongoing encouragement by family and caregivers.    4.  Depression.  Patient does not feel like  he is depressed.  He has lost interest in things he used to enjoy and sits most of the time watching TV.  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  Believe he may be dealing with some depression.  Daughter is setting up appointment with psychiatrist, Dr. [name]. He is currently on escitalopram.  Work with psychiatry for appropriate management of depression.    5. Support.  Patient and his wife live at [place].  His wife has Alzheimer's.  They do have caregivers that come into the home 10am-2pm weekdays to help with cleaning, cooking, shopping, etc.  As disease progresses with patient and his wife, daughter is aware that they may need more help. Goal is to stay in their independent living home for as long as possible.  Will reach out to SW for appropriate resources when needed  Palliative care will continue to monitor for symptom management/decline and make recommendations as needed.  Next appointment is in 2 weeks.  I spent 90 minutes providing this consultation,  from 11:00 to 12:30 including time with patient/family, chart review, provider coordination, and documentation. More than 50% of the time in this consultation was spent coordinating communication.   HISTORY OF PRESENT ILLNESS:  Norvin Prakash Kimberling. is a 79 y.o. year old male with multiple medical problems including acute promyelocytic leukemia, BPH, prostate cancer, h/o melanoma, HTN, CAD, HLD, hypothyroidism, sleep apnea, has pacemaker, h/o MI. Palliative Care was asked to help address goals of care.   CODE STATUS: DNR/DNI  PPS: 60% HOSPICE ELIGIBILITY/DIAGNOSIS: TBD  PHYSICAL EXAM:  BP 126/88  HR  81  O2 93% on RA General: NAD, frail appearing Cardiovascular: regular rate and rhythm Pulmonary: lung sounds clear; normal respiratory  effort Abdomen: soft, nontender, + bowel sounds Extremities: trace edema to feet and ankles, arthritic changes noted to bilateral hands Neurological: Weakness but otherwise nonfocal; has forgetfulness and problems with word finding   PAST MEDICAL HISTORY:  Past Medical History:  Diagnosis Date  . Anginal pain ([place])   . Arthritis    mostly hands  . Benign familial hematuria   . BPH (benign prostatic hypertrophy)   . Brain cancer ([place])    melanoma with brain met  . Coronary artery disease   . GERD (gastroesophageal reflux disease)   . High cholesterol   . Hypertension   . Hypothyroidism   . Intracerebral hemorrhage ([place])   . Melanoma ([place])   . Metastatic melanoma to lung ([place])   . Mood disorder ([place])   . Myocardial infarction ([place])   . Pacemaker    due to syncope, 3rd degree HB (upgrade to [facility]. Jude CRT-P 02/25/13 (Dr. Uvaldo Rising)  . Rectal bleed    due to NSAIDS  . Renal disorder   . Sepsis ([place]) 12/09/2018  . Skin cancer    melanoma  . Sleep apnea    uses CPAP  . Stroke [facility])     SOCIAL HX:  Social History   Tobacco Use  . Smoking status: Former Smoker    Packs/day: 1.00    Years: 3.00    Pack years: 3.00    Quit date: 04/10/1957    Years since quitting: 62.2  . Smokeless tobacco: Never Used  Substance Use Topics  . Alcohol use: Yes    Alcohol/week: 14.0 standard drinks    Types: 10 Glasses of wine, 4 Cans of beer per week    ALLERGIES:  Allergies  Allergen Reactions  . Ciprofloxacin Other (See Comments)    Tendonitis  . Ezetimibe Other (See Comments)    Unknown reaction  . Simvastatin Other (See Comments)    Reaction:  Gave pt a fever  Fever - temp of 103.   In 2003     PERTINENT MEDICATIONS:  Outpatient Encounter Medications as of 06/24/2019  Medication Sig  . acetaminophen (TYLENOL) 325 MG tablet Take 2 tablets (650 mg total) by mouth every 6 (six) hours as needed for mild pain (or Fever >/= 101).  Marland Kitchen acyclovir (ZOVIRAX) 400 MG tablet Take 400 mg by  mouth 2 (two) times daily.  Marland Kitchen allopurinol (ZYLOPRIM) 100 MG tablet Take 2 tablets (200 mg total) by mouth daily.  Marland Kitchen aspirin EC 81 MG tablet Take 81 mg by mouth every evening.   . Blood Glucose Monitoring Suppl (ONETOUCH VERIO) w/Device KIT 1 kit by Does not apply route as directed. E11.65  . carboxymethylcellulose (REFRESH TEARS) 0.5 % SOLN Apply to eye.  . Cholecalciferol (VITAMIN D3) 25 MCG (1000 UT) CAPS Take 1 capsule (1,000 Units total) by mouth daily.  . ciprofloxacin (CILOXAN) 0.3 % ophthalmic solution Place 2 drops into the left eye every 2 (two) hours while awake. Administer 1 drop, every 2 hours, while awake, for 2 days.  Then 1 drop, every 4 hours, while awake, for the next 5 days.  . colchicine 0.6 MG tablet Take 1 tablet (0.6 mg total) by mouth daily as needed (gout flare). Take 2 tablets on the first day of flare  . glucose blood test strip E11.65 Use as instructed to check sugars three time daily and as needed  . HUMALOG KWIKPEN 100 UNIT/ML KwikPen   . hydrocortisone cream 1 % Apply topically.  . insulin aspart (NOVOLOG FLEXPEN) 100 UNIT/ML FlexPen Use three times daily with meals as needed per sliding scale: 2 units for every 50 units over 150 (2u for 150-199, 4u for 200-249, 6u for 250-299, etc)  . ketoconazole (NIZORAL) 2 % shampoo Apply topically.  . Lancets (ONETOUCH ULTRASOFT) lancets E11.65 Use as instructed to check sugars three time daily and as needed (Patient not taking: Reported on 06/18/2019)  . levothyroxine (SYNTHROID) 137 MCG tablet TAKE 1 TABLET BY MOUTH ONCE DAILY BEFORE BREAKFAST (Patient taking differently: Take 137 mcg by mouth daily before breakfast. )  . methylphenidate (RITALIN) 5 MG tablet Take 1 tablet (5 mg total) by mouth 2 (two) times daily with breakfast and lunch.  . metoprolol succinate (TOPROL-XL) 50 MG 24 hr tablet TAKE 1 AND 1/2 TABLET (75 MG) BY MOUTH EVERY DAY WITH OR IMMEDIATELY FOLLOWING A MEAL *DO NOT CRUSH* (Patient taking differently: Take 50 mg  by mouth daily. )  . mineral oil-hydrophilic petrolatum (AQUAPHOR) ointment Apply topically.  . ondansetron (ZOFRAN-ODT) 4 MG disintegrating tablet Take 1 tablet (4 mg total) by mouth every 8 (eight) hours as needed for nausea or vomiting. (Patient not taking: Reported on 06/18/2019)  . polyethylene glycol (MIRALAX / GLYCOLAX) 17 g packet Take 17 g by mouth 2 (two) times daily. (Patient taking differently: Take 17 g by mouth daily. )  . senna (SENOKOT) 8.6 MG TABS tablet Take 1 tablet (8.6 mg total) by mouth at bedtime.  . senna-docusate (SENOKOT-S) 8.6-50 MG tablet Take by mouth.  . sertraline (ZOLOFT) 25 MG tablet Take 1 tablet (25 mg total) by mouth daily.  . sertraline (ZOLOFT) 50 MG tablet Take 1 tablet (50 mg total) by mouth daily. Take with breakfast.  Initiate 34m dose after completing 5 days of 229m.  . sodium chloride (ALTAMIST SPRAY) 0.65 % nasal spray Place into the nose.  . testosterone cypionate (DEPOTESTOSTERONE CYPIONATE) 200 MG/ML injection Inject 0.5 mLs (100 mg total) into the muscle every 14 (fourteen) days.  . traZODone (DESYREL) 50 MG tablet Take 0.5-1 tablets (25-50 mg total) by mouth at bedtime. (Patient taking differently: Take 25 mg by mouth at bedtime as needed for sleep. )  . tretinoin (VESANOID) 10 MG capsule   . Turmeric Curcumin 500 MG CAPS Take by mouth.   Facility-Administered Encounter Medications as of 06/24/2019  Medication  . 0.9 %  sodium chloride infusion  . arsenic trioxide (TRISENOX) 13.3 mg in sodium chloride 0.9 % 250 mL chemo Infusion  . arsenic trioxide (TRISENOX) 13.3 mg in sodium chloride 0.9 % 250 mL chemo Infusion  . sodium chloride flush (NS) 0.9 % injection 10 mL     Aurielle Slingerland K Jenetta DownerNP

## 2019-06-25 ENCOUNTER — Inpatient Hospital Stay: Payer: Medicare Other

## 2019-06-26 ENCOUNTER — Inpatient Hospital Stay: Payer: Medicare Other

## 2019-06-27 ENCOUNTER — Inpatient Hospital Stay: Payer: Medicare Other

## 2019-06-27 DIAGNOSIS — F332 Major depressive disorder, recurrent severe without psychotic features: Secondary | ICD-10-CM | POA: Diagnosis not present

## 2019-06-27 DIAGNOSIS — F5105 Insomnia due to other mental disorder: Secondary | ICD-10-CM | POA: Diagnosis not present

## 2019-06-30 ENCOUNTER — Inpatient Hospital Stay: Payer: Medicare Other

## 2019-06-30 ENCOUNTER — Inpatient Hospital Stay: Payer: Medicare Other | Admitting: Oncology

## 2019-06-30 DIAGNOSIS — R2689 Other abnormalities of gait and mobility: Secondary | ICD-10-CM | POA: Diagnosis not present

## 2019-06-30 DIAGNOSIS — R278 Other lack of coordination: Secondary | ICD-10-CM | POA: Diagnosis not present

## 2019-06-30 DIAGNOSIS — M6281 Muscle weakness (generalized): Secondary | ICD-10-CM | POA: Diagnosis not present

## 2019-06-30 DIAGNOSIS — C9590 Leukemia, unspecified not having achieved remission: Secondary | ICD-10-CM | POA: Diagnosis not present

## 2019-07-01 ENCOUNTER — Inpatient Hospital Stay: Payer: Medicare Other

## 2019-07-01 ENCOUNTER — Ambulatory Visit (INDEPENDENT_AMBULATORY_CARE_PROVIDER_SITE_OTHER): Payer: Medicare Other | Admitting: *Deleted

## 2019-07-01 DIAGNOSIS — I442 Atrioventricular block, complete: Secondary | ICD-10-CM

## 2019-07-01 LAB — CUP PACEART REMOTE DEVICE CHECK
Battery Remaining Longevity: 71 mo
Battery Remaining Percentage: 95.5 %
Battery Voltage: 2.98 V
Brady Statistic AP VP Percent: 89 %
Brady Statistic AP VS Percent: 1 %
Brady Statistic AS VP Percent: 11 %
Brady Statistic AS VS Percent: 1 %
Brady Statistic RA Percent Paced: 89 %
Date Time Interrogation Session: 20210323020008
Implantable Lead Implant Date: 19980107
Implantable Lead Implant Date: 19980107
Implantable Lead Implant Date: 20141118
Implantable Lead Location: 753858
Implantable Lead Location: 753859
Implantable Lead Location: 753860
Implantable Pulse Generator Implant Date: 20201223
Lead Channel Impedance Value: 1025 Ohm
Lead Channel Impedance Value: 350 Ohm
Lead Channel Impedance Value: 430 Ohm
Lead Channel Pacing Threshold Amplitude: 0.75 V
Lead Channel Pacing Threshold Amplitude: 1 V
Lead Channel Pacing Threshold Amplitude: 1.25 V
Lead Channel Pacing Threshold Pulse Width: 0.5 ms
Lead Channel Pacing Threshold Pulse Width: 0.5 ms
Lead Channel Pacing Threshold Pulse Width: 1 ms
Lead Channel Sensing Intrinsic Amplitude: 3 mV
Lead Channel Setting Pacing Amplitude: 2 V
Lead Channel Setting Pacing Amplitude: 2.25 V
Lead Channel Setting Pacing Amplitude: 2.5 V
Lead Channel Setting Pacing Pulse Width: 0.5 ms
Lead Channel Setting Pacing Pulse Width: 1 ms
Lead Channel Setting Sensing Sensitivity: 4 mV
Pulse Gen Model: 3562
Pulse Gen Serial Number: 6033885

## 2019-07-02 ENCOUNTER — Inpatient Hospital Stay: Payer: Medicare Other

## 2019-07-02 NOTE — Progress Notes (Signed)
PPM Remote  

## 2019-07-03 ENCOUNTER — Inpatient Hospital Stay: Payer: Medicare Other

## 2019-07-04 ENCOUNTER — Inpatient Hospital Stay: Payer: Medicare Other

## 2019-07-04 ENCOUNTER — Telehealth: Payer: Self-pay | Admitting: Adult Health Nurse Practitioner

## 2019-07-04 NOTE — Telephone Encounter (Signed)
Returned daughter's call.  Patient is having new onset edema to bilateral legs.  No pain, erythema, SOB, cough reported.  VS per daughter BP 130/90, resp 24, HR 70s, O2 93%.  Patient not in distress.  Discussed reasons to take to ER over the weekend.  Scheduled appointment for 07/07/19 at 12:30.   Lawrie Tunks K. Olena Heckle NP

## 2019-07-07 ENCOUNTER — Other Ambulatory Visit: Payer: Self-pay

## 2019-07-07 ENCOUNTER — Other Ambulatory Visit: Payer: Medicare Other | Admitting: Adult Health Nurse Practitioner

## 2019-07-07 ENCOUNTER — Ambulatory Visit: Payer: Medicare Other | Admitting: Oncology

## 2019-07-07 ENCOUNTER — Other Ambulatory Visit: Payer: Medicare Other

## 2019-07-07 ENCOUNTER — Ambulatory Visit: Payer: Medicare Other

## 2019-07-07 DIAGNOSIS — C9590 Leukemia, unspecified not having achieved remission: Secondary | ICD-10-CM | POA: Diagnosis not present

## 2019-07-07 DIAGNOSIS — R278 Other lack of coordination: Secondary | ICD-10-CM | POA: Diagnosis not present

## 2019-07-07 DIAGNOSIS — C9240 Acute promyelocytic leukemia, not having achieved remission: Secondary | ICD-10-CM

## 2019-07-07 DIAGNOSIS — Z515 Encounter for palliative care: Secondary | ICD-10-CM | POA: Diagnosis not present

## 2019-07-07 DIAGNOSIS — M6281 Muscle weakness (generalized): Secondary | ICD-10-CM | POA: Diagnosis not present

## 2019-07-07 DIAGNOSIS — R2689 Other abnormalities of gait and mobility: Secondary | ICD-10-CM | POA: Diagnosis not present

## 2019-07-07 NOTE — Progress Notes (Signed)
[facility] Consult Note Telephone: [phone]  Fax: [phone]  PATIENT NAME: David [name]. DOB: February 26, 1941 MRN: [id number]  PRIMARY CARE PROVIDER:   [name], MD  REFERRING PROVIDER:  Billey Chang NP  RESPONSIBLE PARTY:   [name], daughter  [phone] Patient's home phone [phone]  Cell # [phone]    RECOMMENDATIONS and PLAN:  1.  Advanced care planning.  Patient is a DNR/DNI.  MOST form in Epic.  2.  New onset edema.  Patient has increased edema to bilateral feet and ankles (see assessment below).  He states that the edema started having the increased edema about a week ago.  Denies increased SOB, cough, chest pain, PND, orthopnea.  Does state having some pain and balance issues with the increased edema.  Patient has history of MI and stated that he was told that the heart failure would return.  States that he does not notice any difference in the edema with propping his feet.  Has appointment with cardiology tomorrow.  Have recommended TED hose and to follow recommendations by cardiology.  Have emailed via EPIC Dr. Caryl Comes with cardiology concerns.  I spent 50 minutes providing this consultation,  from 12:10 to 1:00 including time with patient/family, chart review, provider coordination, and documentation. More than 50% of the time in this consultation was spent coordinating communication.   HISTORY OF PRESENT ILLNESS:  David Stiff. is a 79 y.o. year old male with multiple medical problems including acute promyelocytic leukemia, BPH, prostate cancer, h/o melanoma, HTN, CAD, HLD, hypothyroidism, sleep apnea, has pacemaker, h/o MI.. Palliative Care was asked to help address goals of care.   CODE STATUS: DNR/DNI  PPS: 60% HOSPICE ELIGIBILITY/DIAGNOSIS: TBD  PHYSICAL EXAM:  BP 118/62  HR 75  O2 97% on RA General: NAD, frail appearing Cardiovascular: regular rate and rhythm Pulmonary: lung  sounds clear; normal respiratory effort Abdomen: soft, nontender, + bowel sounds Extremities: 1+edema to feet and ankles with more around ankles and trace edema to shins, 1+ pedal pulses noted to bilateral feet Neurological: Weakness but otherwise nonfocal; has forgetfulness and problems with word finding  PAST MEDICAL HISTORY:  Past Medical History:  Diagnosis Date  . Anginal pain ([place])   . Arthritis    mostly hands  . Benign familial hematuria   . BPH (benign prostatic hypertrophy)   . Brain cancer ([place])    melanoma with brain met  . Coronary artery disease   . GERD (gastroesophageal reflux disease)   . High cholesterol   . Hypertension   . Hypothyroidism   . Intracerebral hemorrhage ([place])   . Melanoma ([place])   . Metastatic melanoma to lung ([place])   . Mood disorder ([place])   . Myocardial infarction ([place])   . Pacemaker    due to syncope, 3rd degree HB (upgrade to [facility]. Jude CRT-P 02/25/13 (Dr. Uvaldo Rising)  . Rectal bleed    due to NSAIDS  . Renal disorder   . Sepsis ([place]) 12/09/2018  . Skin cancer    melanoma  . Sleep apnea    uses CPAP  . Stroke [facility])     SOCIAL HX:  Social History   Tobacco Use  . Smoking status: Former Smoker    Packs/day: 1.00    Years: 3.00    Pack years: 3.00    Quit date: 04/10/1957    Years since quitting: 62.2  . Smokeless tobacco: Never Used  Substance Use Topics  . Alcohol use: Yes  Alcohol/week: 14.0 standard drinks    Types: 10 Glasses of wine, 4 Cans of beer per week    ALLERGIES:  Allergies  Allergen Reactions  . Ciprofloxacin Other (See Comments)    Tendonitis  . Ezetimibe Other (See Comments)    Unknown reaction  . Simvastatin Other (See Comments)    Reaction:  Gave pt a fever  Fever - temp of 103.   In 2003     PERTINENT MEDICATIONS:  Outpatient Encounter Medications as of 07/07/2019  Medication Sig  . acetaminophen (TYLENOL) 325 MG tablet Take 2 tablets (650 mg total) by mouth every 6 (six) hours as needed for mild pain (or  Fever >/= 101).  Marland Kitchen acyclovir (ZOVIRAX) 400 MG tablet Take 400 mg by mouth 2 (two) times daily.  Marland Kitchen allopurinol (ZYLOPRIM) 100 MG tablet Take 2 tablets (200 mg total) by mouth daily.  Marland Kitchen aspirin EC 81 MG tablet Take 81 mg by mouth every evening.   . Blood Glucose Monitoring Suppl (ONETOUCH VERIO) w/Device KIT 1 kit by Does not apply route as directed. E11.65  . carboxymethylcellulose (REFRESH TEARS) 0.5 % SOLN Apply to eye.  . Cholecalciferol (VITAMIN D3) 25 MCG (1000 UT) CAPS Take 1 capsule (1,000 Units total) by mouth daily.  . ciprofloxacin (CILOXAN) 0.3 % ophthalmic solution Place 2 drops into the left eye every 2 (two) hours while awake. Administer 1 drop, every 2 hours, while awake, for 2 days. Then 1 drop, every 4 hours, while awake, for the next 5 days.  . colchicine 0.6 MG tablet Take 1 tablet (0.6 mg total) by mouth daily as needed (gout flare). Take 2 tablets on the first day of flare  . glucose blood test strip E11.65 Use as instructed to check sugars three time daily and as needed  . HUMALOG KWIKPEN 100 UNIT/ML KwikPen   . hydrocortisone cream 1 % Apply topically.  . insulin aspart (NOVOLOG FLEXPEN) 100 UNIT/ML FlexPen Use three times daily with meals as needed per sliding scale: 2 units for every 50 units over 150 (2u for 150-199, 4u for 200-249, 6u for 250-299, etc)  . ketoconazole (NIZORAL) 2 % shampoo Apply topically.  . Lancets (ONETOUCH ULTRASOFT) lancets E11.65 Use as instructed to check sugars three time daily and as needed (Patient not taking: Reported on 06/18/2019)  . levothyroxine (SYNTHROID) 137 MCG tablet TAKE 1 TABLET BY MOUTH ONCE DAILY BEFORE BREAKFAST (Patient taking differently: Take 137 mcg by mouth daily before breakfast. )  . methylphenidate (RITALIN) 5 MG tablet Take 1 tablet (5 mg total) by mouth 2 (two) times daily with breakfast and lunch.  . metoprolol succinate (TOPROL-XL) 50 MG 24 hr tablet TAKE 1 AND 1/2 TABLET (75 MG) BY MOUTH EVERY DAY WITH OR IMMEDIATELY  FOLLOWING A MEAL *DO NOT CRUSH* (Patient taking differently: Take 50 mg by mouth daily. )  . mineral oil-hydrophilic petrolatum (AQUAPHOR) ointment Apply topically.  . ondansetron (ZOFRAN-ODT) 4 MG disintegrating tablet Take 1 tablet (4 mg total) by mouth every 8 (eight) hours as needed for nausea or vomiting. (Patient not taking: Reported on 06/18/2019)  . polyethylene glycol (MIRALAX / GLYCOLAX) 17 g packet Take 17 g by mouth 2 (two) times daily. (Patient taking differently: Take 17 g by mouth daily. )  . senna (SENOKOT) 8.6 MG TABS tablet Take 1 tablet (8.6 mg total) by mouth at bedtime.  . senna-docusate (SENOKOT-S) 8.6-50 MG tablet Take by mouth.  . sertraline (ZOLOFT) 25 MG tablet Take 1 tablet (25 mg total) by mouth  daily.  . sertraline (ZOLOFT) 50 MG tablet Take 1 tablet (50 mg total) by mouth daily. Take with breakfast.  Initiate 100m dose after completing 5 days of 274m.  . sodium chloride (ALTAMIST SPRAY) 0.65 % nasal spray Place into the nose.  . testosterone cypionate (DEPOTESTOSTERONE CYPIONATE) 200 MG/ML injection Inject 0.5 mLs (100 mg total) into the muscle every 14 (fourteen) days.  . traZODone (DESYREL) 50 MG tablet Take 0.5-1 tablets (25-50 mg total) by mouth at bedtime. (Patient taking differently: Take 25 mg by mouth at bedtime as needed for sleep. )  . tretinoin (VESANOID) 10 MG capsule   . Turmeric Curcumin 500 MG CAPS Take by mouth.   Facility-Administered Encounter Medications as of 07/07/2019  Medication  . 0.9 %  sodium chloride infusion  . arsenic trioxide (TRISENOX) 13.3 mg in sodium chloride 0.9 % 250 mL chemo Infusion  . arsenic trioxide (TRISENOX) 13.3 mg in sodium chloride 0.9 % 250 mL chemo Infusion  . sodium chloride flush (NS) 0.9 % injection 10 mL      David [name] Jenetta DownerNP

## 2019-07-08 ENCOUNTER — Ambulatory Visit (INDEPENDENT_AMBULATORY_CARE_PROVIDER_SITE_OTHER): Payer: Medicare Other | Admitting: Internal Medicine

## 2019-07-08 ENCOUNTER — Encounter: Payer: Medicare Other | Admitting: Internal Medicine

## 2019-07-08 ENCOUNTER — Ambulatory Visit: Payer: Medicare Other

## 2019-07-08 ENCOUNTER — Other Ambulatory Visit: Payer: Self-pay

## 2019-07-08 ENCOUNTER — Telehealth: Payer: Self-pay | Admitting: Adult Health Nurse Practitioner

## 2019-07-08 VITALS — BP 124/84 | HR 75 | Ht 69.0 in | Wt 204.0 lb

## 2019-07-08 DIAGNOSIS — I495 Sick sinus syndrome: Secondary | ICD-10-CM | POA: Diagnosis not present

## 2019-07-08 DIAGNOSIS — Z950 Presence of cardiac pacemaker: Secondary | ICD-10-CM | POA: Diagnosis not present

## 2019-07-08 DIAGNOSIS — I442 Atrioventricular block, complete: Secondary | ICD-10-CM

## 2019-07-08 NOTE — Patient Instructions (Signed)
Medication Instructions:  - We will discuss possibly adding a fluid pill with your daughter and call to update you on this  *If you need a refill on your cardiac medications before your next appointment, please call your pharmacy*   Lab Work: - none ordered  If you have labs (blood work) drawn today and your tests are completely normal, you will receive your results only by: Marland Kitchen MyChart Message (if you have MyChart) OR . A paper copy in the mail If you have any lab test that is abnormal or we need to change your treatment, we will call you to review the results.   Testing/Procedures: - none ordered   Follow-Up: At [facility], you and your health needs are our priority.  As part of our continuing mission to provide you with exceptional heart care, we have created designated Provider Care Teams.  These Care Teams include your primary Cardiologist (physician) and Advanced Practice Providers (APPs -  Physician Assistants and Nurse Practitioners) who all work together to provide you with the care you need, when you need it.  We recommend signing up for the patient portal called "MyChart".  Sign up information is provided on this After Visit Summary.  MyChart is used to connect with patients for Virtual Visits (Telemedicine).  Patients are able to view lab/test results, encounter notes, upcoming appointments, etc.  Non-urgent messages can be sent to your provider as well.   To learn more about what you can do with MyChart, go to [email or web address].    Your next appointment:   1 year(s)  The format for your next appointment:   In Person  Provider:   Virl Axe, MD   Other Instructions n/a

## 2019-07-08 NOTE — Telephone Encounter (Signed)
Talked with daughter about visit scheduled for Thursday as I did see the patient yesterday.  She wanted to cancel that appointment and have me call back in a couple months to see if a visit was needed.  Stated that her father is doing better now that he is back on his zoloft.  Will call back in 8 weeks. Rylynn Kobs K. Olena Heckle NP

## 2019-07-08 NOTE — Progress Notes (Signed)
Patient Care Team: [name], MD as PCP - General (Family Medicine) Minna Merritts, MD as PCP - Cardiology (Cardiology) Deboraha Sprang, MD as PCP - Electrophysiology (Cardiology) Cira Rue, RN Nurse Navigator as Registered Nurse (Medical Oncology) Acquanetta Chain, DO as Consulting Physician [facility] and Palliative Medicine) [name], MD as Consulting Physician (Hematology and Oncology)   HPI  David [name]. is a 79 y.o. male Seen in followup for [facility].  Hx of PM 1998 with upgrade 2014   Hx of ischemic heart disease and prior CABG 2014; denies chest pain.  He does not have edema.  He has some mild shortness of breath with exertion.  He has stage 4 melanoma with brain mets and neuro complications including memory and aphasia; treated sleep apnea  Is also been diagnosed with acute promyelocytic leukemia.  Treatment resulted in remission.  Further treatment has been declined.  Some shortness of breath.  Peripheral edema.  Albumin of recently was 3.5.  Dyspnea on exertion. DATE TEST EF   11/12 Cardiac CTA  T LAD  11/12 Myoview 42% Inferoapical scar  11/12 Echo 45-50%   6/18 Echo   60-65 %    8/20 Echo  55-%      Date Cr K Hgb  3/21 1.27 4.0 13.0         \      Past Medical History:  Diagnosis Date  . Anginal pain ([address])   . Arthritis    mostly hands  . Benign familial hematuria   . BPH (benign prostatic hypertrophy)   . Brain cancer ([place])    melanoma with brain met  . Coronary artery disease   . GERD (gastroesophageal reflux disease)   . High cholesterol   . Hypertension   . Hypothyroidism   . Intracerebral hemorrhage ([place])   . Melanoma ([place])   . Metastatic melanoma to lung ([place])   . Mood disorder ([place])   . Myocardial infarction ([place])   . Pacemaker    due to syncope, 3rd degree HB (upgrade to [facility]. Jude CRT-P 02/25/13 (Dr. Uvaldo Rising)  . Rectal bleed    due to NSAIDS  . Renal disorder   . Sepsis ([place])  12/09/2018  . Skin cancer    melanoma  . Sleep apnea    uses CPAP  . Stroke [facility])     Past Surgical History:  Procedure Laterality Date  . APPLICATION OF CRANIAL NAVIGATION N/A 10/15/2015   Procedure: APPLICATION OF CRANIAL NAVIGATION;  Surgeon: Erline Levine, MD;  Location: [place] NEURO ORS;  Service: Neurosurgery;  Laterality: N/A;  . BIV PACEMAKER GENERATOR CHANGEOUT N/A 04/02/2019   Procedure: BIV PACEMAKER GENERATOR CHANGEOUT;  Surgeon: Evans Lance, MD;  Location: [place] CV LAB;  Service: Cardiovascular;  Laterality: N/A;  . CARDIAC CATHETERIZATION  2013  . CARDIAC SURGERY     bypass X 2  . COLONOSCOPY WITH PROPOFOL N/A 05/08/2016   diverticulosis, int hem, no f/u needed [name])  . CORONARY ARTERY BYPASS GRAFT  2013   LIMA-LAD, SVG-PDA 03/27/11 (Dr. Francee Gentile)  . CRANIOTOMY N/A 10/15/2015   Procedure: LEFT FRONTAL CRANIOTOMY TUMOR EXCISION with Curve;  Surgeon: Erline Levine, MD;  Location: [place] NEURO ORS;  Service: Neurosurgery;  Laterality: N/A;  CRANIOTOMY TUMOR EXCISION  . EYE SURGERY    . FOOT SURGERY  08/2010  . JOINT REPLACEMENT Left    partial knee  . KNEE ARTHROSCOPY  12/2008  . LYMPH NODE BIOPSY    .  PACEMAKER INSERTION    . TONSILLECTOMY      Current Meds  Medication Sig  . acetaminophen (TYLENOL) 325 MG tablet Take 2 tablets (650 mg total) by mouth every 6 (six) hours as needed for mild pain (or Fever >/= 101).  Marland Kitchen acyclovir (ZOVIRAX) 400 MG tablet Take 400 mg by mouth 2 (two) times daily.  Marland Kitchen allopurinol (ZYLOPRIM) 100 MG tablet Take 2 tablets (200 mg total) by mouth daily.  Marland Kitchen aspirin EC 81 MG tablet Take 81 mg by mouth every evening.   . Blood Glucose Monitoring Suppl (ONETOUCH VERIO) w/Device KIT 1 kit by Does not apply route as directed. E11.65  . carboxymethylcellulose (REFRESH TEARS) 0.5 % SOLN Apply to eye daily as needed.   . Cholecalciferol (VITAMIN D3) 25 MCG (1000 UT) CAPS Take 1 capsule (1,000 Units total) by mouth daily.  . colchicine 0.6 MG tablet Take 1  tablet (0.6 mg total) by mouth daily as needed (gout flare). Take 2 tablets on the first day of flare  . glucose blood test strip E11.65 Use as instructed to check sugars three time daily and as needed  . HUMALOG KWIKPEN 100 UNIT/ML KwikPen   . insulin aspart (NOVOLOG FLEXPEN) 100 UNIT/ML FlexPen Use three times daily with meals as needed per sliding scale: 2 units for every 50 units over 150 (2u for 150-199, 4u for 200-249, 6u for 250-299, etc)  . Lancets (ONETOUCH ULTRASOFT) lancets E11.65 Use as instructed to check sugars three time daily and as needed  . levothyroxine (SYNTHROID) 137 MCG tablet TAKE 1 TABLET BY MOUTH ONCE DAILY BEFORE BREAKFAST (Patient taking differently: Take 137 mcg by mouth daily before breakfast. )  . methylphenidate (RITALIN) 5 MG tablet Take 1 tablet (5 mg total) by mouth 2 (two) times daily with breakfast and lunch.  . metoprolol succinate (TOPROL-XL) 50 MG 24 hr tablet TAKE 1 AND 1/2 TABLET (75 MG) BY MOUTH EVERY DAY WITH OR IMMEDIATELY FOLLOWING A MEAL *DO NOT CRUSH* (Patient taking differently: Take 50 mg by mouth daily. )  . polyethylene glycol (MIRALAX / GLYCOLAX) 17 g packet Take 17 g by mouth 2 (two) times daily. (Patient taking differently: Take 17 g by mouth daily. )  . senna (SENOKOT) 8.6 MG TABS tablet Take 1 tablet (8.6 mg total) by mouth at bedtime.  . sertraline (ZOLOFT) 50 MG tablet Take 1 tablet (50 mg total) by mouth daily. Take with breakfast.  Initiate 51m dose after completing 5 days of 243m. (Patient taking differently: Take 50 mg by mouth daily. )  . testosterone cypionate (DEPOTESTOSTERONE CYPIONATE) 200 MG/ML injection Inject 0.5 mLs (100 mg total) into the muscle every 14 (fourteen) days.  . traZODone (DESYREL) 50 MG tablet Take 0.5-1 tablets (25-50 mg total) by mouth at bedtime. (Patient taking differently: Take 25 mg by mouth at bedtime as needed for sleep. )  . tretinoin (VESANOID) 10 MG capsule   . Turmeric Curcumin 500 MG CAPS Take by mouth.      Allergies  Allergen Reactions  . Ciprofloxacin Other (See Comments)    Tendonitis  . Ezetimibe Other (See Comments)    Unknown reaction  . Simvastatin Other (See Comments)    Reaction:  Gave pt a fever  Fever - temp of 103.   In 2003      Review of Systems negative except from HPI and PMH  Physical Exam BP 124/84 (BP Location: Left Arm, Patient Position: Sitting, Cuff Size: Normal)   Pulse 75   Ht  5' 9"  (1.753 m)   Wt 204 lb (92.5 kg)   SpO2 97%   BMI 30.13 kg/m  Well developed and well nourished in no acute distress HENT normal Neck supple with JVP 6-8 Clear Device pocket well healed; without hematoma or erythema.  There is no tethering  Regular rate and rhythm, no  gallop 2/6 murmur Abd-soft with active BS No Clubbing cyanosis 2+ edema Skin-warm and dry A & Oriented  Grossly normal sensory and motor function  ECG AV pacing at 75   Assessment and  Plan  Complete heart block device dependent   Ischemic cardiomyopathy-interval resolution  Coronary disease with prior bypass  CRT-P [facility] status post surgical resection apparently disease-free  Renal insufficiency grade 3  Prostate --  Promyelocytic leukemia-acute-remission  Congestive failure-acute/chronic  Device function is normal.  However, rate response was off and it has been activated.  Perhaps this will help with his dyspnea on exertion.  He is volume overloaded.  Albumin is normal.  Suspect this is heart failure related; will talk with his daughter and anticipate initiation of a diuretic on an every other day basis for about a week.  Think his creatinine will be able to tolerate this.  Without symptoms of ischemia  He has chosen not to pursue further therapy for his leukemia.  I cannot tell from the oncology note as to what the applied prognosis is.  I will chat with his daughter about this as well as the diuretic.  He is not able to articulate the prognosis related  to his leukemia.  He did mention that "I am okay with dying "does have a DNR.

## 2019-07-09 ENCOUNTER — Ambulatory Visit: Payer: Medicare Other

## 2019-07-09 DIAGNOSIS — R278 Other lack of coordination: Secondary | ICD-10-CM | POA: Diagnosis not present

## 2019-07-09 DIAGNOSIS — C9590 Leukemia, unspecified not having achieved remission: Secondary | ICD-10-CM | POA: Diagnosis not present

## 2019-07-09 DIAGNOSIS — M6281 Muscle weakness (generalized): Secondary | ICD-10-CM | POA: Diagnosis not present

## 2019-07-09 DIAGNOSIS — R2689 Other abnormalities of gait and mobility: Secondary | ICD-10-CM | POA: Diagnosis not present

## 2019-07-10 ENCOUNTER — Inpatient Hospital Stay: Payer: Medicare Other

## 2019-07-10 ENCOUNTER — Telehealth: Payer: Self-pay

## 2019-07-10 ENCOUNTER — Other Ambulatory Visit: Payer: Self-pay | Admitting: Oncology

## 2019-07-10 NOTE — Progress Notes (Signed)
[place]  [phone] [phone]  ID: David [name]. OB: December 15, 1940  MR#: [id number]  GNO#:[id number]  Patient Care Team: [name], MD as PCP - General (Family Medicine) Minna Merritts, MD as PCP - Cardiology (Cardiology) Deboraha Sprang, MD as PCP - Electrophysiology (Cardiology) Cira Rue, RN Nurse Navigator as Registered Nurse ([place]) Acquanetta Chain, DO as Consulting Physician [facility] and Palliative Medicine) [name], MD as Consulting Physician (Hematology and Oncology)  CHIEF COMPLAINT: Acute promyelocytic leukemia in remission  INTERVAL HISTORY: Patient returns to clinic today for repeat laboratory work and further evaluation.  His weakness and fatigue as well as performance status are significantly improved.  His peripheral edema has improved now that he is taking Lasix 40 mg every other day.  He also has an improved mood since his Zoloft has been increased to 100 mg daily.  He has no neurologic complaints.  He has a good appetite and denies weight loss.  He denies any recent fevers or illnesses. He denies any chest pain, shortness of breath, cough, or hemoptysis.  He denies any nausea, vomiting, constipation, or diarrhea.  He has no urinary complaints.  Patient offers no specific complaints today.    REVIEW OF SYSTEMS:   Review of Systems  Constitutional: Positive for malaise/fatigue. Negative for fever and weight loss.  Respiratory: Negative.  Negative for cough, hemoptysis and shortness of breath.   Cardiovascular: Negative.  Negative for chest pain and leg swelling.  Gastrointestinal: Negative.  Negative for abdominal pain and nausea.  Genitourinary: Negative.  Negative for hematuria.  Musculoskeletal: Negative.  Negative for back pain.  Skin: Negative.  Negative for rash.  Neurological: Positive for weakness. Negative for dizziness, focal weakness and headaches.  Psychiatric/Behavioral:  Negative.  The patient is not nervous/anxious.     As per HPI. Otherwise, a complete review of systems is negative.  PAST MEDICAL HISTORY: Past Medical History:  Diagnosis Date  . Anginal pain ([place])   . Arthritis    mostly hands  . Benign familial hematuria   . BPH (benign prostatic hypertrophy)   . Brain cancer ([place])    melanoma with brain met  . Coronary artery disease   . GERD (gastroesophageal reflux disease)   . High cholesterol   . Hypertension   . Hypothyroidism   . Intracerebral hemorrhage ([place])   . Melanoma ([place])   . Metastatic melanoma to lung ([place])   . Mood disorder ([place])   . Myocardial infarction ([place])   . Pacemaker    due to syncope, 3rd degree HB (upgrade to [facility]. Jude CRT-P 02/25/13 (Dr. Uvaldo Rising)  . Rectal bleed    due to NSAIDS  . Renal disorder   . Sepsis ([place]) 12/09/2018  . Skin cancer    melanoma  . Sleep apnea    uses CPAP  . Stroke [facility])     PAST SURGICAL HISTORY: Past Surgical History:  Procedure Laterality Date  . APPLICATION OF CRANIAL NAVIGATION N/A 10/15/2015   Procedure: APPLICATION OF CRANIAL NAVIGATION;  Surgeon: Erline Levine, MD;  Location: [place] NEURO ORS;  Service: Neurosurgery;  Laterality: N/A;  . BIV PACEMAKER GENERATOR CHANGEOUT N/A 04/02/2019   Procedure: BIV PACEMAKER GENERATOR CHANGEOUT;  Surgeon: Evans Lance, MD;  Location: [place] CV LAB;  Service: Cardiovascular;  Laterality: N/A;  . CARDIAC CATHETERIZATION  2013  . CARDIAC SURGERY     bypass X 2  . COLONOSCOPY WITH PROPOFOL N/A 05/08/2016   diverticulosis, int hem,  no f/u needed [name])  . CORONARY ARTERY BYPASS GRAFT  2013   LIMA-LAD, SVG-PDA 03/27/11 (Dr. Francee Gentile)  . CRANIOTOMY N/A 10/15/2015   Procedure: LEFT FRONTAL CRANIOTOMY TUMOR EXCISION with Curve;  Surgeon: Erline Levine, MD;  Location: [place] NEURO ORS;  Service: Neurosurgery;  Laterality: N/A;  CRANIOTOMY TUMOR EXCISION  . EYE SURGERY    . FOOT SURGERY  08/2010  . JOINT REPLACEMENT Left    partial knee  . KNEE  ARTHROSCOPY  12/2008  . LYMPH NODE BIOPSY    . PACEMAKER INSERTION    . TONSILLECTOMY      FAMILY HISTORY: Family History  Problem Relation Age of Onset  . Cancer Mother 43       lung   . Hypertension Mother   . Hypertension Father   . Heart attack Father   . Heart disease Father   . Rheum arthritis Sister   . Cancer Maternal Grandmother     ADVANCED DIRECTIVES (Y/N):  N  HEALTH MAINTENANCE: Social History   Tobacco Use  . Smoking status: Former Smoker    Packs/day: 1.00    Years: 3.00    Pack years: 3.00    Quit date: 04/10/1957    Years since quitting: 62.3  . Smokeless tobacco: Never Used  Substance Use Topics  . Alcohol use: Yes    Alcohol/week: 14.0 standard drinks    Types: 10 Glasses of wine, 4 Cans of beer per week  . Drug use: No     Colonoscopy:  PAP:  Bone density:  Lipid panel:  Allergies  Allergen Reactions  . Ciprofloxacin Other (See Comments)    Tendonitis  . Ezetimibe Other (See Comments)    Unknown reaction  . Simvastatin Other (See Comments)    Reaction:  Gave pt a fever  Fever - temp of 103.   In 2003    Current Outpatient Medications  Medication Sig Dispense Refill  . acetaminophen (TYLENOL) 325 MG tablet Take 2 tablets (650 mg total) by mouth every 6 (six) hours as needed for mild pain (or Fever >/= 101).    Marland Kitchen aspirin EC 81 MG tablet Take 81 mg by mouth every evening.     . Blood Glucose Monitoring Suppl (ONETOUCH VERIO) w/Device KIT 1 kit by Does not apply route as directed. E11.65 1 kit 0  . carboxymethylcellulose (REFRESH TEARS) 0.5 % SOLN Apply to eye daily as needed.     . Cholecalciferol (VITAMIN D3) 25 MCG (1000 UT) CAPS Take 1 capsule (1,000 Units total) by mouth daily. 30 capsule 0  . furosemide (LASIX) 20 MG tablet Take 1 tablet (20 mg total) by mouth every other day. (Patient taking differently: Take 40 mg by mouth every other day. ) 30 tablet 3  . glucose blood test strip E11.65 Use as instructed to check sugars three time  daily and as needed 100 each 12  . HUMALOG KWIKPEN 100 UNIT/ML KwikPen     . hydrocortisone cream 1 % Apply topically.    . insulin aspart (NOVOLOG FLEXPEN) 100 UNIT/ML FlexPen Use three times daily with meals as needed per sliding scale: 2 units for every 50 units over 150 (2u for 150-199, 4u for 200-249, 6u for 250-299, etc) 3 mL 1  . ketoconazole (NIZORAL) 2 % shampoo Apply topically.    . Lancets (ONETOUCH ULTRASOFT) lancets E11.65 Use as instructed to check sugars three time daily and as needed 100 each 12  . levothyroxine (SYNTHROID) 137 MCG tablet TAKE 1 TABLET BY MOUTH  ONCE DAILY BEFORE BREAKFAST (Patient taking differently: Take 137 mcg by mouth daily before breakfast. ) 30 tablet 11  . methylphenidate (RITALIN) 5 MG tablet Take 1 tablet (5 mg total) by mouth 2 (two) times daily with breakfast and lunch. 60 tablet 0  . metoprolol succinate (TOPROL-XL) 50 MG 24 hr tablet TAKE 1 AND 1/2 TABLET (75 MG) BY MOUTH EVERY DAY WITH OR IMMEDIATELY FOLLOWING A MEAL *DO NOT CRUSH* 50 tablet 11  . mineral oil-hydrophilic petrolatum (AQUAPHOR) ointment Apply topically.    . polyethylene glycol (MIRALAX / GLYCOLAX) 17 g packet Take 17 g by mouth 2 (two) times daily. (Patient taking differently: Take 17 g by mouth daily. ) 14 each 0  . senna-docusate (SENOKOT-S) 8.6-50 MG tablet Take by mouth.    . sertraline (ZOLOFT) 50 MG tablet Take 1 tablet (50 mg total) by mouth daily. Take with breakfast.  Initiate 52m dose after completing 5 days of 240m. (Patient taking differently: Take 100 mg by mouth daily. Patient was increased to 2/day (100 mg)) 90 tablet 3  . sodium chloride (ALTAMIST SPRAY) 0.65 % nasal spray Place into the nose.    . testosterone cypionate (DEPOTESTOSTERONE CYPIONATE) 200 MG/ML injection Inject 0.5 mLs (100 mg total) into the muscle every 14 (fourteen) days. 10 mL 0  . traZODone (DESYREL) 50 MG tablet Take 0.5-1 tablets (25-50 mg total) by mouth at bedtime. (Patient taking differently: Take  25 mg by mouth at bedtime as needed for sleep. ) 30 tablet 1  . tretinoin (VESANOID) 10 MG capsule     . Turmeric Curcumin 500 MG CAPS Take by mouth.    . Marland Kitchencyclovir (ZOVIRAX) 400 MG tablet Take 400 mg by mouth 2 (two) times daily.    . Marland Kitchenllopurinol (ZYLOPRIM) 100 MG tablet Take 2 tablets (200 mg total) by mouth daily. 180 tablet 1  . ciprofloxacin (CILOXAN) 0.3 % ophthalmic solution Place 2 drops into the left eye every 2 (two) hours while awake. Administer 1 drop, every 2 hours, while awake, for 2 days. Then 1 drop, every 4 hours, while awake, for the next 5 days. (Patient not taking: Reported on 07/16/2019) 5 mL 0  . colchicine 0.6 MG tablet Take 1 tablet (0.6 mg total) by mouth daily as needed (gout flare). Take 2 tablets on the first day of flare (Patient not taking: Reported on 07/16/2019) 30 tablet 0  . ondansetron (ZOFRAN-ODT) 4 MG disintegrating tablet Take 1 tablet (4 mg total) by mouth every 8 (eight) hours as needed for nausea or vomiting. (Patient not taking: Reported on 07/16/2019) 60 tablet 2   No current facility-administered medications for this visit.   Facility-Administered Medications Ordered in Other Visits  Medication Dose Route Frequency Provider Last Rate Last Admin  . 0.9 %  sodium chloride infusion  1,000 mL Intravenous Once FeTruitt MerleMD      . arsenic trioxide (TRISENOX) 13.3 mg in sodium chloride 0.9 % 250 mL chemo Infusion  0.15 mg/kg/day (Treatment Plan Recorded) Intravenous Daily FiLloyd HugerMD   Stopped at 04/21/19 1404  . arsenic trioxide (TRISENOX) 13.3 mg in sodium chloride 0.9 % 250 mL chemo Infusion  0.15 mg/kg/day (Treatment Plan Recorded) Intravenous Daily FiLloyd HugerMD 132 mL/hr at 05/09/19 1340 13.3 mg at 05/09/19 1340  . sodium chloride flush (NS) 0.9 % injection 10 mL  10 mL Intracatheter PRN FiLloyd HugerMD   10 mL at 05/09/19 1318    OBJECTIVE: Vitals:   07/16/19 1106  BP: 113/86  Pulse: 76  Resp: 18  Temp: 97.9 F (36.6 C)    SpO2: 97%     Body mass index is 29.24 kg/m.    ECOG FS:1 - Symptomatic but completely ambulatory  General: Well-developed, well-nourished, no acute distress. Eyes: Pink conjunctiva, anicteric sclera. HEENT: Normocephalic, moist mucous membranes. Lungs: No audible wheezing or coughing. Heart: Regular rate and rhythm. Abdomen: Soft, nontender, no obvious distention. Musculoskeletal: No edema, cyanosis, or clubbing. Neuro: Alert, answering all questions appropriately. Cranial nerves grossly intact. Skin: No rashes or petechiae noted. Psych: Normal affect.  LAB RESULTS:  Lab Results  Component Value Date   NA 139 07/16/2019   K 4.2 07/16/2019   CL 104 07/16/2019   CO2 24 07/16/2019   GLUCOSE 160 (H) 07/16/2019   BUN 44 (H) 07/16/2019   CREATININE 1.81 (H) 07/16/2019   CALCIUM 9.5 07/16/2019   PROT 6.5 07/16/2019   ALBUMIN 3.7 07/16/2019   AST 20 07/16/2019   ALT 16 07/16/2019   ALKPHOS 45 07/16/2019   BILITOT 0.8 07/16/2019   GFRNONAA 35 (L) 07/16/2019   GFRAA 40 (L) 07/16/2019    Lab Results  Component Value Date   WBC 4.5 07/16/2019   NEUTROABS 3.3 07/16/2019   HGB 13.8 07/16/2019   HCT 41.2 07/16/2019   MCV 97.2 07/16/2019   PLT 164 07/16/2019     STUDIES: CUP PACEART REMOTE DEVICE CHECK  Result Date: 07/01/2019 Scheduled remote reviewed. Normal device function. Next remote 91 days- JBox, RN/CVRS   ASSESSMENT: Acute promyelocytic leukemia in remission  PLAN:    1. Acute promyelocytic leukemia in remission: Patient completed his induction chemotherapy at [facility] in approximately November 2020.  Peripheral blood FISH-PML t(15:17) noted complete molecular remission.  Patient only completed 1 cycle of maintenance chemotherapy consisting of ATRA 14 days on 14 days off along with arsenic trioxide at 0.15 mg/kg/day Monday through Friday for 28 days prior to discontinuing treatment permanently.  No intervention is needed.  Peripheral blood FISH panel is  pending at time of dictation.  Return to clinic in 2 months with repeat laboratory work and further evaluation.  Appreciate palliative care input.    2.  Nausea: Patient does not complain of this today.  Continue ondansetron as needed. 3.  Rash: Patient does not complain of this today. 4.  Cardiac disease/biventricular pacer: Given patient's biventricular pacing, he has a significantly abnormal EKG with a prolonged QTC of approximately 560. Continue close follow-up with cardiology as scheduled.  5.  Melanoma: No obvious evidence of recurrence.  Monitor.  Continue follow-up with neuro-oncology as scheduled. 6.  Prostate cancer: Patient's most recent PSA in June 2020 was reported at 4.35. 7.  Weakness and fatigue: Significantly improved. 8.  Renal insufficiency: Chronic and unchanged.  Patient's creatinine is slightly worse at 1.1 today.  Monitor. 9.  Leukopenia: Resolved. 10.  Confusion: Resolved.  Patient expressed understanding and was in agreement with this plan. He also understands that He can call clinic at any time with any questions, concerns, or complaints.     [name], MD   07/16/2019 2:57 PM

## 2019-07-10 NOTE — Telephone Encounter (Signed)
Pt's daughter calling stating that at pt's LOV on 07/08/19 with Dr. Caryl Comes he stated that he would put pt on furosemide (Lasix) and daughter is calling requesting a refill for this medication. Daughter would like a call back concerning this matter at [phone]. Please address

## 2019-07-10 NOTE — Telephone Encounter (Signed)
To Dr. Caryl Comes to review as I am unsure what the exact plan is regarding his diuretic.

## 2019-07-11 ENCOUNTER — Ambulatory Visit: Payer: Medicare Other

## 2019-07-11 ENCOUNTER — Other Ambulatory Visit: Payer: Self-pay | Admitting: Medical

## 2019-07-11 ENCOUNTER — Other Ambulatory Visit: Payer: Self-pay | Admitting: Oncology

## 2019-07-11 DIAGNOSIS — F5105 Insomnia due to other mental disorder: Secondary | ICD-10-CM | POA: Diagnosis not present

## 2019-07-11 DIAGNOSIS — M6281 Muscle weakness (generalized): Secondary | ICD-10-CM | POA: Diagnosis not present

## 2019-07-11 DIAGNOSIS — F332 Major depressive disorder, recurrent severe without psychotic features: Secondary | ICD-10-CM | POA: Diagnosis not present

## 2019-07-11 DIAGNOSIS — R2689 Other abnormalities of gait and mobility: Secondary | ICD-10-CM | POA: Diagnosis not present

## 2019-07-11 DIAGNOSIS — R278 Other lack of coordination: Secondary | ICD-10-CM | POA: Diagnosis not present

## 2019-07-11 DIAGNOSIS — C9590 Leukemia, unspecified not having achieved remission: Secondary | ICD-10-CM | POA: Diagnosis not present

## 2019-07-11 MED ORDER — FUROSEMIDE 20 MG PO TABS: 20.0000 mg | ORAL_TABLET | ORAL | 3 refills | Status: DC

## 2019-07-11 MED ORDER — FUROSEMIDE 20 MG PO TABS: 20.0000 mg | ORAL_TABLET | ORAL | 1 refills | Status: DC

## 2019-07-14 NOTE — Telephone Encounter (Signed)
H we did this last week right Thanks SK

## 2019-07-15 ENCOUNTER — Other Ambulatory Visit: Payer: Self-pay | Admitting: Oncology

## 2019-07-15 ENCOUNTER — Encounter: Payer: Self-pay | Admitting: Oncology

## 2019-07-15 DIAGNOSIS — R2689 Other abnormalities of gait and mobility: Secondary | ICD-10-CM | POA: Diagnosis not present

## 2019-07-15 DIAGNOSIS — C9590 Leukemia, unspecified not having achieved remission: Secondary | ICD-10-CM | POA: Diagnosis not present

## 2019-07-15 DIAGNOSIS — R278 Other lack of coordination: Secondary | ICD-10-CM | POA: Diagnosis not present

## 2019-07-15 DIAGNOSIS — M6281 Muscle weakness (generalized): Secondary | ICD-10-CM | POA: Diagnosis not present

## 2019-07-15 NOTE — Progress Notes (Signed)
Patient states he has no concerns. Was not able to verify medications with patient over phone. He stated it would be better to go over with his caregiver tomorrow at appointment.

## 2019-07-16 ENCOUNTER — Inpatient Hospital Stay (HOSPITAL_BASED_OUTPATIENT_CLINIC_OR_DEPARTMENT_OTHER): Payer: Medicare Other | Admitting: Oncology

## 2019-07-16 ENCOUNTER — Inpatient Hospital Stay: Payer: Medicare Other | Attending: Oncology

## 2019-07-16 ENCOUNTER — Inpatient Hospital Stay (HOSPITAL_BASED_OUTPATIENT_CLINIC_OR_DEPARTMENT_OTHER): Payer: Medicare Other | Admitting: Hospice and Palliative Medicine

## 2019-07-16 ENCOUNTER — Other Ambulatory Visit: Payer: Self-pay

## 2019-07-16 VITALS — BP 113/86 | HR 76 | Temp 97.9°F | Resp 18 | Wt 198.0 lb

## 2019-07-16 DIAGNOSIS — Z515 Encounter for palliative care: Secondary | ICD-10-CM

## 2019-07-16 DIAGNOSIS — Z809 Family history of malignant neoplasm, unspecified: Secondary | ICD-10-CM | POA: Insufficient documentation

## 2019-07-16 DIAGNOSIS — C9590 Leukemia, unspecified not having achieved remission: Secondary | ICD-10-CM | POA: Diagnosis not present

## 2019-07-16 DIAGNOSIS — C9241 Acute promyelocytic leukemia, in remission: Secondary | ICD-10-CM

## 2019-07-16 DIAGNOSIS — Z87891 Personal history of nicotine dependence: Secondary | ICD-10-CM | POA: Diagnosis not present

## 2019-07-16 DIAGNOSIS — Z95828 Presence of other vascular implants and grafts: Secondary | ICD-10-CM

## 2019-07-16 DIAGNOSIS — Z8673 Personal history of transient ischemic attack (TIA), and cerebral infarction without residual deficits: Secondary | ICD-10-CM | POA: Diagnosis not present

## 2019-07-16 DIAGNOSIS — R278 Other lack of coordination: Secondary | ICD-10-CM | POA: Diagnosis not present

## 2019-07-16 DIAGNOSIS — N189 Chronic kidney disease, unspecified: Secondary | ICD-10-CM | POA: Insufficient documentation

## 2019-07-16 DIAGNOSIS — R9431 Abnormal electrocardiogram [ECG] [EKG]: Secondary | ICD-10-CM | POA: Insufficient documentation

## 2019-07-16 DIAGNOSIS — Z9221 Personal history of antineoplastic chemotherapy: Secondary | ICD-10-CM | POA: Insufficient documentation

## 2019-07-16 DIAGNOSIS — I252 Old myocardial infarction: Secondary | ICD-10-CM | POA: Insufficient documentation

## 2019-07-16 DIAGNOSIS — Z85828 Personal history of other malignant neoplasm of skin: Secondary | ICD-10-CM | POA: Insufficient documentation

## 2019-07-16 DIAGNOSIS — Z8546 Personal history of malignant neoplasm of prostate: Secondary | ICD-10-CM | POA: Diagnosis not present

## 2019-07-16 DIAGNOSIS — M6281 Muscle weakness (generalized): Secondary | ICD-10-CM | POA: Diagnosis not present

## 2019-07-16 DIAGNOSIS — I519 Heart disease, unspecified: Secondary | ICD-10-CM | POA: Diagnosis not present

## 2019-07-16 DIAGNOSIS — Z950 Presence of cardiac pacemaker: Secondary | ICD-10-CM | POA: Diagnosis not present

## 2019-07-16 DIAGNOSIS — R2689 Other abnormalities of gait and mobility: Secondary | ICD-10-CM | POA: Diagnosis not present

## 2019-07-16 LAB — CBC WITH DIFFERENTIAL/PLATELET
Abs Immature Granulocytes: 0.01 10*3/uL (ref 0.00–0.07)
Basophils Absolute: 0 10*3/uL (ref 0.0–0.1)
Basophils Relative: 0 %
Eosinophils Absolute: 0.2 10*3/uL (ref 0.0–0.5)
Eosinophils Relative: 4 %
HCT: 41.2 % (ref 39.0–52.0)
Hemoglobin: 13.8 g/dL (ref 13.0–17.0)
Immature Granulocytes: 0 %
Lymphocytes Relative: 13 %
Lymphs Abs: 0.6 10*3/uL — ABNORMAL LOW (ref 0.7–4.0)
MCH: 32.5 pg (ref 26.0–34.0)
MCHC: 33.5 g/dL (ref 30.0–36.0)
MCV: 97.2 fL (ref 80.0–100.0)
Monocytes Absolute: 0.4 10*3/uL (ref 0.1–1.0)
Monocytes Relative: 8 %
Neutro Abs: 3.3 10*3/uL (ref 1.7–7.7)
Neutrophils Relative %: 75 %
Platelets: 164 10*3/uL (ref 150–400)
RBC: 4.24 MIL/uL (ref 4.22–5.81)
RDW: 14.5 % (ref 11.5–15.5)
WBC: 4.5 10*3/uL (ref 4.0–10.5)
nRBC: 0 % (ref 0.0–0.2)

## 2019-07-16 LAB — COMPREHENSIVE METABOLIC PANEL
ALT: 16 U/L (ref 0–44)
AST: 20 U/L (ref 15–41)
Albumin: 3.7 g/dL (ref 3.5–5.0)
Alkaline Phosphatase: 45 U/L (ref 38–126)
Anion gap: 11 (ref 5–15)
BUN: 44 mg/dL — ABNORMAL HIGH (ref 8–23)
CO2: 24 mmol/L (ref 22–32)
Calcium: 9.5 mg/dL (ref 8.9–10.3)
Chloride: 104 mmol/L (ref 98–111)
Creatinine, Ser: 1.81 mg/dL — ABNORMAL HIGH (ref 0.61–1.24)
GFR calc Af Amer: 40 mL/min — ABNORMAL LOW (ref >60)
GFR calc non Af Amer: 35 mL/min — ABNORMAL LOW (ref >60)
Glucose, Bld: 160 mg/dL — ABNORMAL HIGH (ref 70–99)
Potassium: 4.2 mmol/L (ref 3.5–5.1)
Sodium: 139 mmol/L (ref 135–145)
Total Bilirubin: 0.8 mg/dL (ref 0.3–1.2)
Total Protein: 6.5 g/dL (ref 6.5–8.1)

## 2019-07-16 MED ORDER — HEPARIN SOD (PORK) LOCK FLUSH 100 UNIT/ML IV SOLN
500.0000 [IU] | Freq: Once | INTRAVENOUS | Status: AC
  Administered 2019-07-16: 12:00:00 500 [IU] via INTRAVENOUS
  Filled 2019-07-16: qty 5

## 2019-07-16 MED ORDER — SODIUM CHLORIDE 0.9% FLUSH
10.0000 mL | Freq: Once | INTRAVENOUS | Status: AC
  Administered 2019-07-16: 10 mL via INTRAVENOUS
  Filled 2019-07-16: qty 10

## 2019-07-16 NOTE — Addendum Note (Signed)
Addended by: Livia Snellen on: 07/16/2019 11:38 AM   Modules accepted: Orders

## 2019-07-16 NOTE — Progress Notes (Signed)
[place]  [phone] [phone]   Name: David [name]. Date: 07/16/2019 MRN: [id number]  DOB: 08-19-1940  Patient Care Team: [name], MD as PCP - General (Family Medicine) Minna Merritts, MD as PCP - Cardiology (Cardiology) Deboraha Sprang, MD as PCP - Electrophysiology (Cardiology) Cira Rue, RN Nurse Navigator as Registered Nurse (Medical Oncology) Acquanetta Chain, DO as Consulting Physician [facility] and Palliative Medicine) [name], MD as Consulting Physician (Hematology and Oncology)    REASON FOR CONSULTATION: Alfredo Spong. is a 79 y.o. male with multiple medical problems including acute promyelocytic leukemia in remission on current treatment with arsenic and ATRA.  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  Patient follows at [facility] for his APML but receives treatment locally.  He has had fatigue.  Patient was referred to palliative care to help address goals and manage ongoing symptoms.   SOCIAL HISTORY:     reports that he quit smoking about 62 years ago. He has a 3.00 pack-year smoking history. He has never used smokeless tobacco. He reports current alcohol use of about 14.0 standard drinks of alcohol per week. He reports that he does not use drugs.   Patient is married and lives at home with his wife.  They live at [facility] in an apartment.  He has a son in [place] and a daughter in [place].  Patient is a retired Pharmacist, community.  ADVANCE DIRECTIVES:  On file  CODE STATUS: DNR/DNI (MOST form on file)  PAST MEDICAL HISTORY: Past Medical History:  Diagnosis Date  . Anginal pain ([place])   . Arthritis    mostly hands  . Benign familial hematuria   . BPH (benign prostatic hypertrophy)   . Brain cancer ([place])    melanoma with brain met  .  Coronary artery disease   . GERD (gastroesophageal reflux disease)   . High cholesterol   . Hypertension   . Hypothyroidism   . Intracerebral hemorrhage ([place])   . Melanoma ([place])   . Metastatic melanoma to lung ([place])   . Mood disorder ([place])   . Myocardial infarction ([place])   . Pacemaker    due to syncope, 3rd degree HB (upgrade to [facility]. Jude CRT-P 02/25/13 (Dr. Uvaldo Rising)  . Rectal bleed    due to NSAIDS  . Renal disorder   . Sepsis ([place]) 12/09/2018  . Skin cancer    melanoma  . Sleep apnea    uses CPAP  . Stroke [facility])     PAST SURGICAL HISTORY:  Past Surgical History:  Procedure Laterality Date  . APPLICATION OF CRANIAL NAVIGATION N/A 10/15/2015   Procedure: APPLICATION OF CRANIAL NAVIGATION;  Surgeon: Erline Levine, MD;  Location: [place] NEURO ORS;  Service: Neurosurgery;  Laterality: N/A;  . BIV PACEMAKER GENERATOR CHANGEOUT N/A 04/02/2019   Procedure: BIV PACEMAKER GENERATOR CHANGEOUT;  Surgeon: Evans Lance, MD;  Location: [place] CV LAB;  Service: Cardiovascular;  Laterality: N/A;  . CARDIAC CATHETERIZATION  2013  . CARDIAC SURGERY     bypass X 2  . COLONOSCOPY WITH PROPOFOL N/A 05/08/2016   diverticulosis, int hem, no f/u needed [name])  . CORONARY ARTERY BYPASS GRAFT  2013   LIMA-LAD, SVG-PDA 03/27/11 (Dr. Francee Gentile)  . CRANIOTOMY N/A 10/15/2015   Procedure: LEFT FRONTAL CRANIOTOMY TUMOR EXCISION with Curve;  Surgeon: Erline Levine, MD;  Location: [place] NEURO ORS;  Service: Neurosurgery;  Laterality: N/A;  CRANIOTOMY TUMOR EXCISION  . EYE SURGERY    . FOOT SURGERY  08/2010  . JOINT REPLACEMENT Left    partial knee  . KNEE ARTHROSCOPY  12/2008  . LYMPH NODE BIOPSY    . PACEMAKER INSERTION    . TONSILLECTOMY      HEMATOLOGY/ONCOLOGY HISTORY:  Oncology History Overview Note  Metastatic melanoma ([place])   Staging form: Melanoma of the Skin, AJCC 7th Edition     Clinical stage from 09/21/2015: Stage IV [place], [place], [place]) - Signed by [name], MD on 10/09/2015     Malignant melanoma  ([place])  09/16/2015 Imaging   PET scan showed a hypermetabolic 4.8NI lingular nodule, and a 64m right upper lobe lung nodule, left subclavicular nodes, and bone metastasis in the proximal right humerus and right femur.    09/21/2015 Initial Diagnosis   Metastatic [place]   09/21/2015 Initial Biopsy   Left subclavicular lymph node biopsy showed prostatic of melanoma   09/21/2015 Miscellaneous   BRAF V600K mutation (+)    10/04/2015 Imaging   CT head with without contrast showed a 2.0 x 1.6 x 1.7 cm superficial left frontal hyperdense lesion, with postcontrast enhancement, lying within the previous hemorrhagic area, concerning for solitary melanoma metastasis   10/06/2015 - 09/06/2016 Chemotherapy   Nivolumab [id number] 2 weeks, held due to his dexamethasone for radionecrosis    10/13/2015 - 10/13/2015 Radiation Therapy   10/13/2015 Preop SRS treatment: 14 Gy  to the left frontal lesion in 1 fraction   10/15/2015 Surgery   left front craniotomy and tumor excision    10/15/2015 Pathology Results   left frontal brain mass excision showed metastatic melanoma, 1.5X1.5X0.6cm   01/13/2016 Imaging   CT head without and with contrast showed ill-defined enhancement and dural thickening of the left frontal resection cavity may represent a post treatment changes or residual neoplasm. A new 6 mm hypodensity in the right cerebellar hemisphere with uncertainty enhancement may represent a metastasis or small brain parenchymal hemorrhage.    01/27/2016 - 01/27/2016 Radiation Therapy   01/27/16 SRS Treatment:  20 Gy in 1 fraction to a 6 mm right cerebellar brain met    02/2016 - 05/24/2018 Chemotherapy   Monthly Xgeva starting 02/2016, changed to every 3 months on 01/30/2018. Stopped on 05/24/2018   03/29/2016 PET scan   PET 03/29/2016 IMPRESSION: 1. Overall there has been an interval response to therapy. The index lesions sided on previous exam it are less hypermetabolic when compared with the previous  exam. 2. There is an intramuscular lesion within the right masseter which has increased FDG uptake compared with previous exam. 3. No new sites of disease identified. 4. Aortic atherosclerosis and infrarenal abdominal aortic aneurysm. Recommend followup by ultrasound in 3 years. This recommendation follows ACR consensus guidelines: White Paper of the ACR Incidental Findings Committee II on Vascular Findings. J Joellyn Ruedadiol 2013; 10:789-794   04/20/2016 Imaging   CT Head w wo contrast 1. Mild patchy enhancement at the left anterior frontal lobe treatment site (series 11, image 39) without mass effect and stable surrounding white matter hypodensity without strong evidence of acute vasogenic edema. Recommend continued CT surveillance. 2. No new metastatic disease or new intracranial abnormality identified. 3. Probable benign 14 mm left parietal bone lucent lesion, unchanged since May 2017.   05/06/2016 - 05/08/2016 Hospital Admission   Pt was admitted for rectal bleeding for one  day. Breathing spontaneously resolved, hemoglobin dropped from 15 to 10.5, did not require blood transfusion. Colonoscopy showed no active bleeding, except diverticulosis and hemorrhoid. He was discharged to home.   05/08/2016 Procedure   Colonoscopy  - Stool in the ascending colon. Fluid aspiration performed. - Diverticulosis in the entire examined colon. - Non-bleeding internal hemorrhoids. - The examination was otherwise normal on direct and retroflexion views.   05/29/2016 Imaging   PET Image Restaging IMPRESSION: 1. Stable exam with no evidence progression. 2. Photopenia in the LEFT frontal lobe at prior surgical site. 3. Stable mildly hypermetabolic RIGHT paratracheal node. This may represent a thyroid nodule. 4. Stable size of minimally metabolic lingular pulmonary nodule. 5. Stable skeletal lesion of the RIGHT iliac crest 6. No cutaneous metabolic lesion.   07/20/2016 Imaging   CT Head W WO  Contrast IMPRESSION: Pronounced change in the left frontal region. Much more regional vasogenic edema. Relatively stable appearance of the abnormal enhancement extending from the lateral margin of the frontal horn of left lateral ventricle to the frontal brain surface. New region of abnormal enhancement surrounding the frontal horn of the left lateral ventricle measuring approximately 3.2 cm. The differential diagnosis is recurrent tumor versus radiation necrosis. The patient is reportedly not an MRI candidate. This area is large enough that CT perfusion could possibly make a reasonable differentiation.   09/02/2016 Imaging   CT Head 09/02/16 IMPRESSION: Somewhat mixed pattern of slight posterior progression of pleomorphic enhancement surrounding the LEFT lateral ventricle although no significant mass effect, and reduced vasogenic edema compared with the scan in April.   09/27/2016 - 11/02/2016 Chemotherapy   Start dabrafenib and trametinib 09/27/16, stopped due to severe fatigue     10/05/2016 Imaging   CT Head w & w/o contrast  IMPRESSION: 1. Decreased size of the enhancing area within the left frontal lobe treatment site. Surrounding edema has also decreased. The findings support continued evolution of radiation necrosis. 2. No new enhancing lesions.   10/18/2016 PET scan   IMPRESSION: 1. New focal hypermetabolism in the proximal sigmoid colon with associated focal colonic wall thickening and mild pericolonic fat stranding at the site of a proximal sigmoid diverticulum, favored to represent mild acute sigmoid diverticulitis. Metastatic disease is considered less likely. Clinical correlation is necessary. Consider appropriate antibiotic therapy, as clinically warranted. Attention to this region recommended on future PET-CT follow-up. 2. Otherwise complete metabolic response with no evidence of hypermetabolic metastatic disease. Lingular pulmonary nodule is slightly decreased in  size and demonstrates no significant metabolism. 3. No residual hypermetabolism in the right thyroid lobe nodule, which is stable in size. 4. Aortic Atherosclerosis (ICD10-I70.0). Stable 3.1 cm infrarenal abdominal aortic aneurysm. Aortic aneurysm NOS (ICD10-I71.9). Recommend followup by ultrasound in 3 years. This recommendation follows ACR consensus guidelines: White Paper of the ACR Incidental Findings Committee II on Vascular Findings. Joellyn Rued Radiol 2013; 68:127-517.   11/01/2016 - 11/04/2016 Hospital Admission   Patient admitted to the hospital due to hypercalcemia where he received IV fluids and pamidronate   12/13/2016 PET scan   PET 12/13/16 IMPRESSION: 1. No findings to suggest metastatic disease on today's examination. 2. Previously noted hypermetabolism in the sigmoid colon has resolved, presumably indicative of a focus of diverticulitis on the prior study. Today's study does demonstrate relatively diffuse hypermetabolism throughout the cecum, ascending colon and proximal transverse colon where there is some mild mural thickening, favored to reflect mild chronic inflammation related to underlying diverticular disease. No definite inflammatory changes on the CT portion  of the examination to strongly suggest an active acute diverticulitis at this time. No discrete lesion to suggest neoplasm. 3. Previously noted 8 mm lingular nodule is stable in size and known remains non hypermetabolic. This is nonspecific but reassuring. Continued attention on future followup studies is recommended. 4. Aortic atherosclerosis, in addition to left main and 3 vessel coronary artery disease. Status post median sternotomy for CABG including LIMA to the LAD. In addition, there is mild fusiform aneurysmal dilatation of the infrarenal abdominal aorta (3.1 cm in diameter) as well as aneurysmal dilatation of the left common iliac   12/18/2016 - 12/31/2017 Chemotherapy   Restarted nivolamab 238m every  2 weeks, with tapering dose dexamethasone     04/18/2017 Imaging   PET Scan IMPRESSION: 1. No hypermetabolic lesion to suggest active malignancy. 2. Stable 8 mm lingular nodule is not currently hypermetabolic but merit surveillance. Infrarenal abdominal aortic aneurysm 3.1 cm in diameter. Recommend followup by [facility] 3 years. 3. Other imaging findings of potential clinical significance: Postoperative findings in the left frontal lobe. Right renal cyst. Aortic Atherosclerosis (ICD10-I70.0). Osteoarthritis. Lumbar scoliosis. Left knee effusion. Colonic diverticulosis.    10/10/2017 PET scan   IMPRESSION: 1. Small focus of FDG accumulation in the subcutaneous fat of the right gluteal region, nonspecific. Attention on follow-up suggested. 2. Otherwise stable exam without new or progressive hypermetabolic disease in the interval since prior PET-CT. 3. Stable 8 mm nodule in the lingula. No evidence for hypermetabolism on PET imaging. 4.  Aortic Atherosclerois (ICD10-170.0)    01/29/2018 PET scan   IMPRESSION: 1. Small focus of hypermetabolism in the lower left paratracheal neck, which appears to localize to the posterior left thyroid lobe, without discrete thyroid nodule on the CT images, decreased in metabolism since 10/10/2017 PET-CT. This finding is nonspecific and may represent a hypermetabolic thyroid nodule. Consider thyroid ultrasound correlation. 2. No new hypermetabolic findings suspicious for metastatic disease. Previously described low level metabolism in the subcutaneous right gluteal region has resolved, compatible with resolved inflammatory focus. No evidence of recurrent hypermetabolism within the stable lingular pulmonary nodule. No evidence of recurrent osseous hypermetabolism. 3. Stable infrarenal 3.4 cm Abdominal Aortic Aneurysm (ICD10-I71.9). Recommend follow-up aortic ultrasound in 3 years. This recommendation follows ACR consensus guidelines: White Paper of the ACR  Incidental Findings Committee II on Vascular Findings. J Am Coll Radiol 2013;; 40:814-481 4.  Aortic Atherosclerosis (ICD10-I70.0).   09/18/2018 PET scan   PET 09/18/18  IMPRESSION: 1. No findings of active malignancy. The known small right cerebellar mass is not readily apparent on PET-CT but was shown on today's CT head. 2. 3.2 cm infrarenal abdominal aortic aneurysm. Recommend followup by [facility] 3 years. This recommendation follows ACR consensus guidelines: White Paper of the ACR Incidental Findings Committee II on Vascular Findings. J Am Coll Radiol 2013; 10:789-794. Aortic aneurysm NOS (ICD10-I71.9). 3. Other imaging findings of potential clinical significance: Left frontal lobe encephalomalacia underlying the craniotomy site. Chronic paranasal sinusitis. Aortic Atherosclerosis (ICD10-I70.0). Coronary atherosclerosis. Mild cardiomegaly. Colonic diverticulosis. Dextroconvex lumbar scoliosis. Small left knee effusion with mild synovitis. Postoperative findings in the right foot.   09/18/2018 Imaging   CT head  IMPRESSION: 1. No evidence of new intracranial metastases or acute abnormality. 2. Unchanged 4 mm right cerebellar metastasis. 3. Stable post treatment changes in the left frontal lobe.   APML (acute promyelocytic leukemia) in remission ([place]  01/08/2019 Initial Diagnosis   APML (acute promyelocytic [place]) [place]   04/14/2019 -  Chemotherapy   The patient  had palonosetron (ALOXI) injection 0.25 mg, 0.25 mg, Intravenous,  Once, 2 of 4 cycles Administration: 0.25 mg (04/15/2019), 0.25 mg (04/17/2019), 0.25 mg (04/21/2019), 0.25 mg (04/23/2019), 0.25 mg (04/25/2019), 0.25 mg (04/28/2019), 0.25 mg (04/30/2019), 0.25 mg (05/02/2019), 0.25 mg (05/05/2019), 0.25 mg (05/07/2019), 0.25 mg (05/09/2019), 0.25 mg (04/18/2019) arsenic trioxide (TRISENOX) 13.3 mg in sodium chloride 0.9 % 250 mL chemo Infusion, 0.15 mg/kg = 13.3 mg (100 % of original dose 0.15 mg/kg), Intravenous,  Once, 2 of  4 cycles Dose modification: 0.15 mg/kg/day (original dose 0.15 mg/kg, Cycle 1, Reason: Other (see comments)), 0.15 mg/kg (original dose 0.15 mg/kg, Cycle 1, Reason: Other (see comments), Comment: change to mg/kg) Administration: 13.3 mg (04/21/2019), 13.3 mg (04/22/2019), 13.3 mg (04/23/2019), 13.3 mg (04/24/2019), 13.3 mg (04/25/2019), 13.3 mg (04/28/2019), 13.3 mg (04/29/2019), 13.3 mg (04/30/2019), 13.3 mg (05/01/2019), 13.3 mg (05/02/2019), 13.3 mg (05/05/2019), 13.3 mg (05/06/2019), 13.3 mg (05/07/2019), 13.3 mg (05/08/2019), 13.3 mg (05/09/2019)  for chemotherapy treatment.      ALLERGIES:  is allergic to ciprofloxacin; ezetimibe; and simvastatin.  MEDICATIONS:  Current Outpatient Medications  Medication Sig Dispense Refill  . acetaminophen (TYLENOL) 325 MG tablet Take 2 tablets (650 mg total) by mouth every 6 (six) hours as needed for mild pain (or Fever >/= 101).    Marland Kitchen acyclovir (ZOVIRAX) 400 MG tablet Take 400 mg by mouth 2 (two) times daily.    Marland Kitchen allopurinol (ZYLOPRIM) 100 MG tablet Take 2 tablets (200 mg total) by mouth daily. 180 tablet 1  . aspirin EC 81 MG tablet Take 81 mg by mouth every evening.     . Blood Glucose Monitoring Suppl (ONETOUCH VERIO) w/Device KIT 1 kit by Does not apply route as directed. E11.65 1 kit 0  . carboxymethylcellulose (REFRESH TEARS) 0.5 % SOLN Apply to eye daily as needed.     . Cholecalciferol (VITAMIN D3) 25 MCG (1000 UT) CAPS Take 1 capsule (1,000 Units total) by mouth daily. 30 capsule 0  . ciprofloxacin (CILOXAN) 0.3 % ophthalmic solution Place 2 drops into the left eye every 2 (two) hours while awake. Administer 1 drop, every 2 hours, while awake, for 2 days. Then 1 drop, every 4 hours, while awake, for the next 5 days. (Patient not taking: Reported on 07/16/2019) 5 mL 0  . colchicine 0.6 MG tablet Take 1 tablet (0.6 mg total) by mouth daily as needed (gout flare). Take 2 tablets on the first day of flare (Patient not taking: Reported on 07/16/2019) 30 tablet 0  .  furosemide (LASIX) 20 MG tablet Take 1 tablet (20 mg total) by mouth every other day. (Patient taking differently: Take 40 mg by mouth every other day. ) 30 tablet 3  . glucose blood test strip E11.65 Use as instructed to check sugars three time daily and as needed 100 each 12  . HUMALOG KWIKPEN 100 UNIT/ML KwikPen     . hydrocortisone cream 1 % Apply topically.    . insulin aspart (NOVOLOG FLEXPEN) 100 UNIT/ML FlexPen Use three times daily with meals as needed per sliding scale: 2 units for every 50 units over 150 (2u for 150-199, 4u for 200-249, 6u for 250-299, etc) 3 mL 1  . ketoconazole (NIZORAL) 2 % shampoo Apply topically.    . Lancets (ONETOUCH ULTRASOFT) lancets E11.65 Use as instructed to check sugars three time daily and as needed 100 each 12  . levothyroxine (SYNTHROID) 137 MCG tablet TAKE 1 TABLET BY MOUTH ONCE DAILY BEFORE BREAKFAST (Patient taking differently: Take 137 mcg  by mouth daily before breakfast. ) 30 tablet 11  . methylphenidate (RITALIN) 5 MG tablet Take 1 tablet (5 mg total) by mouth 2 (two) times daily with breakfast and lunch. 60 tablet 0  . metoprolol succinate (TOPROL-XL) 50 MG 24 hr tablet TAKE 1 AND 1/2 TABLET (75 MG) BY MOUTH EVERY DAY WITH OR IMMEDIATELY FOLLOWING A MEAL *DO NOT CRUSH* 50 tablet 11  . mineral oil-hydrophilic petrolatum (AQUAPHOR) ointment Apply topically.    . ondansetron (ZOFRAN-ODT) 4 MG disintegrating tablet Take 1 tablet (4 mg total) by mouth every 8 (eight) hours as needed for nausea or vomiting. (Patient not taking: Reported on 07/16/2019) 60 tablet 2  . polyethylene glycol (MIRALAX / GLYCOLAX) 17 g packet Take 17 g by mouth 2 (two) times daily. (Patient taking differently: Take 17 g by mouth daily. ) 14 each 0  . senna-docusate (SENOKOT-S) 8.6-50 MG tablet Take by mouth.    . sertraline (ZOLOFT) 50 MG tablet Take 1 tablet (50 mg total) by mouth daily. Take with breakfast.  Initiate 36m dose after completing 5 days of 296m. (Patient taking  differently: Take 100 mg by mouth daily. Patient was increased to 2/day (100 mg)) 90 tablet 3  . sodium chloride (ALTAMIST SPRAY) 0.65 % nasal spray Place into the nose.    . testosterone cypionate (DEPOTESTOSTERONE CYPIONATE) 200 MG/ML injection Inject 0.5 mLs (100 mg total) into the muscle every 14 (fourteen) days. 10 mL 0  . traZODone (DESYREL) 50 MG tablet Take 0.5-1 tablets (25-50 mg total) by mouth at bedtime. (Patient taking differently: Take 25 mg by mouth at bedtime as needed for sleep. ) 30 tablet 1  . tretinoin (VESANOID) 10 MG capsule     . Turmeric Curcumin 500 MG CAPS Take by mouth.     No current facility-administered medications for this visit.   Facility-Administered Medications Ordered in Other Visits  Medication Dose Route Frequency Provider Last Rate Last Admin  . 0.9 %  sodium chloride infusion  1,000 mL Intravenous Once FeTruitt MerleMD      . arsenic trioxide (TRISENOX) 13.3 mg in sodium chloride 0.9 % 250 mL chemo Infusion  0.15 mg/kg/day (Treatment Plan Recorded) Intravenous Daily FiLloyd HugerMD   Stopped at 04/21/19 1404  . arsenic trioxide (TRISENOX) 13.3 mg in sodium chloride 0.9 % 250 mL chemo Infusion  0.15 mg/kg/day (Treatment Plan Recorded) Intravenous Daily FiLloyd HugerMD 132 mL/hr at 05/09/19 1340 13.3 mg at 05/09/19 1340  . sodium chloride flush (NS) 0.9 % injection 10 mL  10 mL Intracatheter PRN FiLloyd HugerMD   10 mL at 05/09/19 1318    VITAL SIGNS: There were no vitals taken for this visit. There were no vitals filed for this visit.  Estimated body mass index is 29.24 kg/m as calculated from the following:   Height as of 07/08/19: 5' 9"  (1.753 m).   Weight as of an earlier encounter on 07/16/19: 198 lb (89.8 kg).  LABS: CBC:    Component Value Date/Time   WBC 4.5 07/16/2019 1028   HGB 13.8 07/16/2019 1028   HGB 14.0 05/24/2018 1445   HGB 15.8 03/26/2017 0928   HCT 41.2 07/16/2019 1028   HCT 46.9 03/26/2017 0928   PLT 164  07/16/2019 1028   PLT 153 05/24/2018 1445   PLT 158 03/26/2017 0928   MCV 97.2 07/16/2019 1028   MCV 92.6 03/26/2017 0928   NEUTROABS 3.3 07/16/2019 1028   NEUTROABS 3.4 03/26/2017 0928  LYMPHSABS 0.6 (L) 07/16/2019 1028   LYMPHSABS 0.6 (L) 03/26/2017 0928   MONOABS 0.4 07/16/2019 1028   MONOABS 0.6 03/26/2017 0928   EOSABS 0.2 07/16/2019 1028   EOSABS 0.3 03/26/2017 0928   BASOSABS 0.0 07/16/2019 1028   BASOSABS 0.0 03/26/2017 0928   Comprehensive Metabolic Panel:    Component Value Date/Time   NA 139 07/16/2019 1028   NA 140 03/26/2017 0928   K 4.2 07/16/2019 1028   K 4.7 03/26/2017 0928   CL 104 07/16/2019 1028   CO2 24 07/16/2019 1028   CO2 21 (L) 03/26/2017 0928   BUN 44 (H) 07/16/2019 1028   BUN 19.3 03/26/2017 0928   CREATININE 1.81 (H) 07/16/2019 1028   CREATININE 1.77 (H) 05/24/2018 1445   CREATININE 1.2 03/26/2017 0928   GLUCOSE 160 (H) 07/16/2019 1028   GLUCOSE 95 03/26/2017 0928   CALCIUM 9.5 07/16/2019 1028   CALCIUM 8.7 03/26/2017 0928   AST 20 07/16/2019 1028   AST 18 05/24/2018 1445   AST 22 03/26/2017 0928   ALT 16 07/16/2019 1028   ALT 13 05/24/2018 1445   ALT 15 03/26/2017 0928   ALKPHOS 45 07/16/2019 1028   ALKPHOS 58 03/26/2017 0928   BILITOT 0.8 07/16/2019 1028   BILITOT 0.6 05/24/2018 1445   BILITOT 0.58 03/26/2017 0928   PROT 6.5 07/16/2019 1028   PROT 6.8 03/26/2017 0928   ALBUMIN 3.7 07/16/2019 1028   ALBUMIN 3.7 03/26/2017 0928    RADIOGRAPHIC STUDIES: CUP PACEART REMOTE DEVICE CHECK  Result Date: 07/01/2019 Scheduled remote reviewed. Normal device function. Next remote 91 days- JBox, RN/CVRS   PERFORMANCE STATUS (ECOG) : 2 - Symptomatic, <50% confined to bed  Review of Systems Unless otherwise noted, a complete review of systems is negative.  Physical Exam General: NAD, frail appearing, thin Pulmonary: Unlabored Extremities: no edema, no joint deformities Skin: no rashes Neurological: Weakness but otherwise  nonfocal  IMPRESSION: Routine follow-up visit today.  Patient reports that he feels about the same.  He denies any acute changes or concerns.  No symptomatic complaints today.  In my opinion, he look stronger with a brighter affect today.  Patient says that he is following with psychiatry who has been adjusting his Zoloft.  Patient is also being followed in the home by palliative care.  Patient says he is looking forward to watching the Masters this weekend.  PLAN: -Best supportive care -Consider future hospice referral in the setting of decline -RTC in 2 months   Patient expressed understanding and was in agreement with this plan. He also understands that He can call the clinic at any time with any questions, concerns, or complaints.     Time Total: 10 minutes  Visit consisted of counseling and education dealing with the complex and emotionally intense issues of symptom management and palliative care in the setting of serious and potentially life-threatening illness.Greater than 50%  of this time was spent counseling and coordinating care related to the above assessment and plan.  Signed by: Altha Harm, PhD, NP-C

## 2019-07-17 ENCOUNTER — Encounter: Payer: Medicare Other | Admitting: Hospice and Palliative Medicine

## 2019-07-17 DIAGNOSIS — C9590 Leukemia, unspecified not having achieved remission: Secondary | ICD-10-CM | POA: Diagnosis not present

## 2019-07-17 DIAGNOSIS — R2689 Other abnormalities of gait and mobility: Secondary | ICD-10-CM | POA: Diagnosis not present

## 2019-07-17 DIAGNOSIS — M6281 Muscle weakness (generalized): Secondary | ICD-10-CM | POA: Diagnosis not present

## 2019-07-17 DIAGNOSIS — R278 Other lack of coordination: Secondary | ICD-10-CM | POA: Diagnosis not present

## 2019-07-18 LAB — MISC LABCORP TEST (SEND OUT): Labcorp test code: 511834

## 2019-07-20 ENCOUNTER — Other Ambulatory Visit: Payer: Self-pay | Admitting: Oncology

## 2019-07-21 ENCOUNTER — Other Ambulatory Visit: Payer: Self-pay

## 2019-07-21 ENCOUNTER — Encounter: Payer: Self-pay | Admitting: Podiatry

## 2019-07-21 ENCOUNTER — Ambulatory Visit (INDEPENDENT_AMBULATORY_CARE_PROVIDER_SITE_OTHER): Payer: Medicare Other | Admitting: Podiatry

## 2019-07-21 VITALS — Temp 98.1°F

## 2019-07-21 DIAGNOSIS — M79675 Pain in left toe(s): Secondary | ICD-10-CM

## 2019-07-21 DIAGNOSIS — M79674 Pain in right toe(s): Secondary | ICD-10-CM | POA: Diagnosis not present

## 2019-07-21 DIAGNOSIS — B351 Tinea unguium: Secondary | ICD-10-CM | POA: Diagnosis not present

## 2019-07-21 DIAGNOSIS — E119 Type 2 diabetes mellitus without complications: Secondary | ICD-10-CM

## 2019-07-21 NOTE — Progress Notes (Signed)
This patient returns to my office for at risk foot care.  This patient requires this care by a professional since this patient will be at risk due to having diabetes type 2.    This patient is unable to cut nails himself since the patient cannot reach his nails.These nails are painful walking and wearing shoes.  This patient presents for at risk foot care today.  General Appearance  Alert, conversant and in no acute stress.  Vascular  Dorsalis pedis and posterior tibial  pulses are palpable  bilaterally.  Capillary return is within normal limits  bilaterally. Temperature is within normal limits  bilaterally.  Neurologic  [name]-Weinstein monofilament wire test within normal limits  bilaterally. Muscle power within normal limits bilaterally.  Nails Thick disfigured discolored nails with subungual debris  Hallux nails  B/l.Marland Kitchen No evidence of bacterial infection or drainage bilaterally.  Orthopedic  No limitations of motion  feet .  No crepitus or effusions noted.  No bony pathology or digital deformities noted.  Skin  normotropic skin with no porokeratosis noted bilaterally.  No signs of infections or ulcers noted.     Onychomycosis  Pain in right toes  Pain in left toes  Consent was obtained for treatment procedures.   Mechanical debridement of nails 1-5  bilaterally performed with a nail nipper.  Filed with dremel without incident.    Return office visit  3 months                    Told patient to return for periodic foot care and evaluation due to potential at risk complications.   Gardiner Barefoot DPM

## 2019-07-22 ENCOUNTER — Other Ambulatory Visit: Payer: Self-pay | Admitting: *Deleted

## 2019-07-22 DIAGNOSIS — C9590 Leukemia, unspecified not having achieved remission: Secondary | ICD-10-CM | POA: Diagnosis not present

## 2019-07-22 DIAGNOSIS — R2689 Other abnormalities of gait and mobility: Secondary | ICD-10-CM | POA: Diagnosis not present

## 2019-07-22 DIAGNOSIS — R278 Other lack of coordination: Secondary | ICD-10-CM | POA: Diagnosis not present

## 2019-07-22 DIAGNOSIS — I255 Ischemic cardiomyopathy: Secondary | ICD-10-CM

## 2019-07-22 DIAGNOSIS — M6281 Muscle weakness (generalized): Secondary | ICD-10-CM | POA: Diagnosis not present

## 2019-07-24 DIAGNOSIS — C9590 Leukemia, unspecified not having achieved remission: Secondary | ICD-10-CM | POA: Diagnosis not present

## 2019-07-24 DIAGNOSIS — R2689 Other abnormalities of gait and mobility: Secondary | ICD-10-CM | POA: Diagnosis not present

## 2019-07-24 DIAGNOSIS — M6281 Muscle weakness (generalized): Secondary | ICD-10-CM | POA: Diagnosis not present

## 2019-07-24 DIAGNOSIS — R278 Other lack of coordination: Secondary | ICD-10-CM | POA: Diagnosis not present

## 2019-07-25 DIAGNOSIS — R278 Other lack of coordination: Secondary | ICD-10-CM | POA: Diagnosis not present

## 2019-07-25 DIAGNOSIS — R2689 Other abnormalities of gait and mobility: Secondary | ICD-10-CM | POA: Diagnosis not present

## 2019-07-25 DIAGNOSIS — M6281 Muscle weakness (generalized): Secondary | ICD-10-CM | POA: Diagnosis not present

## 2019-07-25 DIAGNOSIS — C9590 Leukemia, unspecified not having achieved remission: Secondary | ICD-10-CM | POA: Diagnosis not present

## 2019-07-25 LAB — CUP PACEART INCLINIC DEVICE CHECK
Battery Remaining Longevity: 74 mo
Battery Voltage: 2.98 V
Brady Statistic RA Percent Paced: 89 %
Brady Statistic RV Percent Paced: 99.99 %
Date Time Interrogation Session: 20210330082700
Implantable Lead Implant Date: 19980107
Implantable Lead Implant Date: 19980107
Implantable Lead Implant Date: 20141118
Implantable Lead Location: 753858
Implantable Lead Location: 753859
Implantable Lead Location: 753860
Implantable Pulse Generator Implant Date: 20201223
Lead Channel Impedance Value: 1050 Ohm
Lead Channel Impedance Value: 362.5 Ohm
Lead Channel Impedance Value: 425 Ohm
Lead Channel Pacing Threshold Amplitude: 0.625 V
Lead Channel Pacing Threshold Amplitude: 1 V
Lead Channel Pacing Threshold Amplitude: 1.25 V
Lead Channel Pacing Threshold Pulse Width: 0.5 ms
Lead Channel Pacing Threshold Pulse Width: 0.5 ms
Lead Channel Pacing Threshold Pulse Width: 1 ms
Lead Channel Sensing Intrinsic Amplitude: 3.1 mV
Lead Channel Setting Pacing Amplitude: 2 V
Lead Channel Setting Pacing Amplitude: 2 V
Lead Channel Setting Pacing Amplitude: 2.25 V
Lead Channel Setting Pacing Pulse Width: 0.5 ms
Lead Channel Setting Pacing Pulse Width: 1 ms
Lead Channel Setting Sensing Sensitivity: 4 mV
Pulse Gen Model: 3562
Pulse Gen Serial Number: 6033885

## 2019-07-28 DIAGNOSIS — R278 Other lack of coordination: Secondary | ICD-10-CM | POA: Diagnosis not present

## 2019-07-28 DIAGNOSIS — R2689 Other abnormalities of gait and mobility: Secondary | ICD-10-CM | POA: Diagnosis not present

## 2019-07-28 DIAGNOSIS — M6281 Muscle weakness (generalized): Secondary | ICD-10-CM | POA: Diagnosis not present

## 2019-07-28 DIAGNOSIS — C9590 Leukemia, unspecified not having achieved remission: Secondary | ICD-10-CM | POA: Diagnosis not present

## 2019-07-30 DIAGNOSIS — M6281 Muscle weakness (generalized): Secondary | ICD-10-CM | POA: Diagnosis not present

## 2019-07-30 DIAGNOSIS — R2689 Other abnormalities of gait and mobility: Secondary | ICD-10-CM | POA: Diagnosis not present

## 2019-07-30 DIAGNOSIS — R278 Other lack of coordination: Secondary | ICD-10-CM | POA: Diagnosis not present

## 2019-07-30 DIAGNOSIS — C9590 Leukemia, unspecified not having achieved remission: Secondary | ICD-10-CM | POA: Diagnosis not present

## 2019-08-01 DIAGNOSIS — C9590 Leukemia, unspecified not having achieved remission: Secondary | ICD-10-CM | POA: Diagnosis not present

## 2019-08-01 DIAGNOSIS — R278 Other lack of coordination: Secondary | ICD-10-CM | POA: Diagnosis not present

## 2019-08-01 DIAGNOSIS — R2689 Other abnormalities of gait and mobility: Secondary | ICD-10-CM | POA: Diagnosis not present

## 2019-08-01 DIAGNOSIS — M6281 Muscle weakness (generalized): Secondary | ICD-10-CM | POA: Diagnosis not present

## 2019-08-04 DIAGNOSIS — R278 Other lack of coordination: Secondary | ICD-10-CM | POA: Diagnosis not present

## 2019-08-04 DIAGNOSIS — R2689 Other abnormalities of gait and mobility: Secondary | ICD-10-CM | POA: Diagnosis not present

## 2019-08-04 DIAGNOSIS — M6281 Muscle weakness (generalized): Secondary | ICD-10-CM | POA: Diagnosis not present

## 2019-08-04 DIAGNOSIS — F332 Major depressive disorder, recurrent severe without psychotic features: Secondary | ICD-10-CM | POA: Diagnosis not present

## 2019-08-04 DIAGNOSIS — C9590 Leukemia, unspecified not having achieved remission: Secondary | ICD-10-CM | POA: Diagnosis not present

## 2019-08-04 DIAGNOSIS — F5105 Insomnia due to other mental disorder: Secondary | ICD-10-CM | POA: Diagnosis not present

## 2019-08-06 DIAGNOSIS — M6281 Muscle weakness (generalized): Secondary | ICD-10-CM | POA: Diagnosis not present

## 2019-08-06 DIAGNOSIS — C9590 Leukemia, unspecified not having achieved remission: Secondary | ICD-10-CM | POA: Diagnosis not present

## 2019-08-06 DIAGNOSIS — R278 Other lack of coordination: Secondary | ICD-10-CM | POA: Diagnosis not present

## 2019-08-06 DIAGNOSIS — R2689 Other abnormalities of gait and mobility: Secondary | ICD-10-CM | POA: Diagnosis not present

## 2019-08-08 DIAGNOSIS — C9590 Leukemia, unspecified not having achieved remission: Secondary | ICD-10-CM | POA: Diagnosis not present

## 2019-08-08 DIAGNOSIS — R2689 Other abnormalities of gait and mobility: Secondary | ICD-10-CM | POA: Diagnosis not present

## 2019-08-08 DIAGNOSIS — M6281 Muscle weakness (generalized): Secondary | ICD-10-CM | POA: Diagnosis not present

## 2019-08-08 DIAGNOSIS — R278 Other lack of coordination: Secondary | ICD-10-CM | POA: Diagnosis not present

## 2019-08-13 DIAGNOSIS — R2689 Other abnormalities of gait and mobility: Secondary | ICD-10-CM | POA: Diagnosis not present

## 2019-08-13 DIAGNOSIS — M6281 Muscle weakness (generalized): Secondary | ICD-10-CM | POA: Diagnosis not present

## 2019-08-13 DIAGNOSIS — C9590 Leukemia, unspecified not having achieved remission: Secondary | ICD-10-CM | POA: Diagnosis not present

## 2019-08-13 DIAGNOSIS — R278 Other lack of coordination: Secondary | ICD-10-CM | POA: Diagnosis not present

## 2019-08-15 DIAGNOSIS — C9590 Leukemia, unspecified not having achieved remission: Secondary | ICD-10-CM | POA: Diagnosis not present

## 2019-08-15 DIAGNOSIS — R278 Other lack of coordination: Secondary | ICD-10-CM | POA: Diagnosis not present

## 2019-08-15 DIAGNOSIS — M6281 Muscle weakness (generalized): Secondary | ICD-10-CM | POA: Diagnosis not present

## 2019-08-15 DIAGNOSIS — R2689 Other abnormalities of gait and mobility: Secondary | ICD-10-CM | POA: Diagnosis not present

## 2019-08-27 ENCOUNTER — Other Ambulatory Visit: Payer: Self-pay

## 2019-08-27 ENCOUNTER — Ambulatory Visit (INDEPENDENT_AMBULATORY_CARE_PROVIDER_SITE_OTHER): Payer: Medicare Other

## 2019-08-27 DIAGNOSIS — I255 Ischemic cardiomyopathy: Secondary | ICD-10-CM | POA: Diagnosis not present

## 2019-08-27 MED ORDER — PERFLUTREN LIPID MICROSPHERE
1.0000 mL | INTRAVENOUS | Status: AC | PRN
  Administered 2019-08-27: 2 mL via INTRAVENOUS

## 2019-09-02 ENCOUNTER — Other Ambulatory Visit: Payer: Self-pay | Admitting: Internal Medicine

## 2019-09-02 MED ORDER — METHYLPHENIDATE HCL 5 MG PO TABS: 5.0000 mg | ORAL_TABLET | Freq: Two times a day (BID) | ORAL | 0 refills | Status: DC

## 2019-09-15 DIAGNOSIS — F5105 Insomnia due to other mental disorder: Secondary | ICD-10-CM | POA: Diagnosis not present

## 2019-09-15 DIAGNOSIS — F332 Major depressive disorder, recurrent severe without psychotic features: Secondary | ICD-10-CM | POA: Diagnosis not present

## 2019-09-19 ENCOUNTER — Inpatient Hospital Stay: Payer: Medicare Other | Attending: Hospice and Palliative Medicine | Admitting: Hospice and Palliative Medicine

## 2019-09-19 ENCOUNTER — Other Ambulatory Visit: Payer: Medicare Other

## 2019-09-19 ENCOUNTER — Encounter: Payer: Self-pay | Admitting: Hospice and Palliative Medicine

## 2019-09-19 ENCOUNTER — Ambulatory Visit: Payer: Medicare Other | Admitting: Oncology

## 2019-09-19 DIAGNOSIS — I252 Old myocardial infarction: Secondary | ICD-10-CM | POA: Insufficient documentation

## 2019-09-19 DIAGNOSIS — Z8673 Personal history of transient ischemic attack (TIA), and cerebral infarction without residual deficits: Secondary | ICD-10-CM | POA: Insufficient documentation

## 2019-09-19 DIAGNOSIS — N289 Disorder of kidney and ureter, unspecified: Secondary | ICD-10-CM | POA: Insufficient documentation

## 2019-09-19 DIAGNOSIS — Z87891 Personal history of nicotine dependence: Secondary | ICD-10-CM | POA: Insufficient documentation

## 2019-09-19 DIAGNOSIS — Z8249 Family history of ischemic heart disease and other diseases of the circulatory system: Secondary | ICD-10-CM | POA: Insufficient documentation

## 2019-09-19 DIAGNOSIS — Z79899 Other long term (current) drug therapy: Secondary | ICD-10-CM | POA: Insufficient documentation

## 2019-09-19 DIAGNOSIS — I10 Essential (primary) hypertension: Secondary | ICD-10-CM | POA: Insufficient documentation

## 2019-09-19 DIAGNOSIS — C9241 Acute promyelocytic leukemia, in remission: Secondary | ICD-10-CM | POA: Diagnosis not present

## 2019-09-19 DIAGNOSIS — Z8261 Family history of arthritis: Secondary | ICD-10-CM | POA: Insufficient documentation

## 2019-09-19 DIAGNOSIS — Z85820 Personal history of malignant melanoma of skin: Secondary | ICD-10-CM | POA: Insufficient documentation

## 2019-09-19 DIAGNOSIS — Z515 Encounter for palliative care: Secondary | ICD-10-CM

## 2019-09-19 DIAGNOSIS — N400 Enlarged prostate without lower urinary tract symptoms: Secondary | ICD-10-CM | POA: Insufficient documentation

## 2019-09-19 DIAGNOSIS — Z8546 Personal history of malignant neoplasm of prostate: Secondary | ICD-10-CM | POA: Insufficient documentation

## 2019-09-19 DIAGNOSIS — I2510 Atherosclerotic heart disease of native coronary artery without angina pectoris: Secondary | ICD-10-CM | POA: Insufficient documentation

## 2019-09-19 DIAGNOSIS — E7800 Pure hypercholesterolemia, unspecified: Secondary | ICD-10-CM | POA: Insufficient documentation

## 2019-09-19 DIAGNOSIS — Z801 Family history of malignant neoplasm of trachea, bronchus and lung: Secondary | ICD-10-CM | POA: Insufficient documentation

## 2019-09-19 DIAGNOSIS — Z7982 Long term (current) use of aspirin: Secondary | ICD-10-CM | POA: Insufficient documentation

## 2019-09-19 DIAGNOSIS — E039 Hypothyroidism, unspecified: Secondary | ICD-10-CM | POA: Insufficient documentation

## 2019-09-19 DIAGNOSIS — K219 Gastro-esophageal reflux disease without esophagitis: Secondary | ICD-10-CM | POA: Insufficient documentation

## 2019-09-19 DIAGNOSIS — R531 Weakness: Secondary | ICD-10-CM | POA: Insufficient documentation

## 2019-09-19 NOTE — Progress Notes (Signed)
Pt reports has been having trouble walking ever since being discharged from [facility] and wonders if it's going to get any better.

## 2019-09-19 NOTE — Progress Notes (Signed)
Virtual Visit via Telephone Note  I connected with David [name]. on 09/19/19 at  2:30 PM EDT by telephone and verified that I am speaking with the correct person using two identifiers.   I discussed the limitations, risks, security and privacy concerns of performing an evaluation and management service by telephone and the availability of in person appointments. I also discussed with the patient that there may be a patient responsible charge related to this service. The patient expressed understanding and agreed to proceed.   History of Present Illness: David [name]. is a 79 y.o. 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  Patient was receiving arsenic for his APML but this was discontinued due to poor tolerance.  Patient was referred to palliative care to help address goals and manage ongoing symptoms.   Observations/Objective: I spoke with patient by phone. He reports persistent weakness and wanted to know if this would improve. Patient has completed home health PT several weeks ago.  He stated that he had some improvement but did not feel like it was as significant as he had hoped.  He is able to play occasional golf but is limited to just a few holes before he becomes overly fatigued.  We discussed the possibility that this may be patient's new functional baseline.  Fatigue is likely multifactorial from his cancer, previous treatment, and overall deconditioning.  I suggested that we could try more physical therapy involvement either at home or through the cares program.  However, he stated that he would want to think about that.  Patient denies other significant changes or concerns.  Assessment and Plan: APML -on best supportive care.  Followed by  home-based palliative care.  Consider hospice at time of decline  Weakness -most likely at functional baseline.  Patient considering if he wants more involvement by physical therapy.  Follow Up Instructions: RTC on 6/18 to see Dr. Grayland Ormond, follow up telephone/virtual visit with me in 2-3 months   I discussed the assessment and treatment plan with the patient. The patient was provided an opportunity to ask questions and all were answered. The patient agreed with the plan and demonstrated an understanding of the instructions.   The patient was advised to call back or seek an in-person evaluation if the symptoms worsen or if the condition fails to improve as anticipated.  I provided 11 minutes of non-face-to-face time during this encounter.   Irean Hong, NP

## 2019-09-23 NOTE — Progress Notes (Signed)
[facility]  [phone] [phone]  ID: David [name]. OB: [birth year]  MR#: [id number]  PPJ#:[id number]  Patient Care Team: [name], MD as PCP - General (Family Medicine) Minna Merritts, MD as PCP - Cardiology (Cardiology) Deboraha Sprang, MD as PCP - Electrophysiology (Cardiology) Cira Rue, RN Nurse Navigator as Registered Nurse ([place]) Acquanetta Chain, DO as Consulting Physician [facility] and Palliative Medicine) [name], MD as Consulting Physician (Hematology and Oncology)  CHIEF COMPLAINT: Acute promyelocytic leukemia in remission  INTERVAL HISTORY: Patient returns to clinic today for repeat laboratory work and further evaluation.  Patient feels his weakness and fatigue are significantly worse.  His peripheral edema is essentially resolved.  He has no neurologic complaints.  He has a fair appetite, but denies weight loss.  He denies any recent fevers or illnesses. He denies any chest pain, shortness of breath, cough, or hemoptysis.  He denies any nausea, vomiting, constipation, or diarrhea.  He has no urinary complaints.  Patient offers no further specific complaints today.  REVIEW OF SYSTEMS:   Review of Systems  Constitutional: Positive for malaise/fatigue. Negative for fever and weight loss.  Respiratory: Negative.  Negative for cough, hemoptysis and shortness of breath.   Cardiovascular: Negative.  Negative for chest pain and leg swelling.  Gastrointestinal: Negative.  Negative for abdominal pain and nausea.  Genitourinary: Negative.  Negative for hematuria.  Musculoskeletal: Negative.  Negative for back pain.  Skin: Negative.  Negative for rash.  Neurological: Positive for weakness. Negative for dizziness, focal weakness and headaches.  Psychiatric/Behavioral: Negative.  The patient is not nervous/anxious.     As per HPI. Otherwise, a complete review of systems is negative.  PAST  MEDICAL HISTORY: Past Medical History:  Diagnosis Date  . Anginal pain ([place])   . Arthritis    mostly hands  . Benign familial hematuria   . BPH (benign prostatic hypertrophy)   . Brain cancer ([place])    melanoma with brain met  . Coronary artery disease   . GERD (gastroesophageal reflux disease)   . High cholesterol   . Hypertension   . Hypothyroidism   . Intracerebral hemorrhage ([place])   . Melanoma ([place])   . Metastatic melanoma to lung ([place])   . Mood disorder ([place])   . Myocardial infarction ([place])   . Pacemaker    due to syncope, 3rd degree HB (upgrade to [facility]. Jude CRT-P 02/25/13 (Dr. Uvaldo Rising)  . Rectal bleed    due to NSAIDS  . Renal disorder   . Sepsis ([place]) 12/09/2018  . Skin cancer    melanoma  . Sleep apnea    uses CPAP  . Stroke [facility])     PAST SURGICAL HISTORY: Past Surgical History:  Procedure Laterality Date  . APPLICATION OF CRANIAL NAVIGATION N/A 10/15/2015   Procedure: APPLICATION OF CRANIAL NAVIGATION;  Surgeon: Erline Levine, MD;  Location: [place] NEURO ORS;  Service: Neurosurgery;  Laterality: N/A;  . BIV PACEMAKER GENERATOR CHANGEOUT N/A 04/02/2019   Procedure: BIV PACEMAKER GENERATOR CHANGEOUT;  Surgeon: Evans Lance, MD;  Location: [place] CV LAB;  Service: Cardiovascular;  Laterality: N/A;  . CARDIAC CATHETERIZATION  2013  . CARDIAC SURGERY     bypass X 2  . COLONOSCOPY WITH PROPOFOL N/A 05/08/2016   diverticulosis, int hem, no f/u needed [name])  . CORONARY ARTERY BYPASS GRAFT  2013   LIMA-LAD, SVG-PDA 03/27/11 (Dr. Francee Gentile)  . CRANIOTOMY N/A 10/15/2015   Procedure: LEFT FRONTAL CRANIOTOMY TUMOR  EXCISION with Curve;  Surgeon: Erline Levine, MD;  Location: [place] NEURO ORS;  Service: Neurosurgery;  Laterality: N/A;  CRANIOTOMY TUMOR EXCISION  . EYE SURGERY    . FOOT SURGERY  08/2010  . JOINT REPLACEMENT Left    partial knee  . KNEE ARTHROSCOPY  12/2008  . LYMPH NODE BIOPSY    . PACEMAKER INSERTION    . TONSILLECTOMY      FAMILY HISTORY: Family History    Problem Relation Age of Onset  . Cancer Mother 36       lung   . Hypertension Mother   . Hypertension Father   . Heart attack Father   . Heart disease Father   . Rheum arthritis Sister   . Cancer Maternal Grandmother     ADVANCED DIRECTIVES (Y/N):  N  HEALTH MAINTENANCE: Social History   Tobacco Use  . Smoking status: Former Smoker    Packs/day: 1.00    Years: 3.00    Pack years: 3.00    Quit date: 04/10/1957    Years since quitting: 62.5  . Smokeless tobacco: Never Used  Vaping Use  . Vaping Use: Never used  Substance Use Topics  . Alcohol use: Yes    Alcohol/week: 14.0 standard drinks    Types: 10 Glasses of wine, 4 Cans of beer per week  . Drug use: No     Colonoscopy:  PAP:  Bone density:  Lipid panel:  Allergies  Allergen Reactions  . Ezetimibe Other (See Comments)    Unknown reaction  . Simvastatin Other (See Comments)    Reaction:  Gave pt a fever  Fever - temp of 103.   In 2003    Current Outpatient Medications  Medication Sig Dispense Refill  . acetaminophen (TYLENOL) 325 MG tablet Take 2 tablets (650 mg total) by mouth every 6 (six) hours as needed for mild pain (or Fever >/= 101).    Marland Kitchen aspirin EC 81 MG tablet Take 81 mg by mouth every evening.     . Blood Glucose Monitoring Suppl (ONETOUCH VERIO) w/Device KIT 1 kit by Does not apply route as directed. E11.65 1 kit 0  . carboxymethylcellulose (REFRESH TEARS) 0.5 % SOLN Apply to eye daily as needed.     . Cholecalciferol (VITAMIN D3) 25 MCG (1000 UT) CAPS Take 1 capsule (1,000 Units total) by mouth daily. 30 capsule 0  . ciprofloxacin (CILOXAN) 0.3 % ophthalmic solution Place 2 drops into the left eye every 2 (two) hours while awake. Administer 1 drop, every 2 hours, while awake, for 2 days. Then 1 drop, every 4 hours, while awake, for the next 5 days. 5 mL 0  . colchicine 0.6 MG tablet Take 1 tablet (0.6 mg total) by mouth daily as needed (gout flare). Take 2 tablets on the first day of flare 30 tablet  0  . furosemide (LASIX) 20 MG tablet Take 1 tablet (20 mg total) by mouth every other day. (Patient taking differently: Take 40 mg by mouth every other day. ) 30 tablet 3  . glucose blood test strip E11.65 Use as instructed to check sugars three time daily and as needed 100 each 12  . HUMALOG KWIKPEN 100 UNIT/ML KwikPen     . hydrocortisone cream 1 % Apply topically.    . insulin aspart (NOVOLOG FLEXPEN) 100 UNIT/ML FlexPen Use three times daily with meals as needed per sliding scale: 2 units for every 50 units over 150 (2u for 150-199, 4u for 200-249, 6u for 250-299, etc)  3 mL 1  . ketoconazole (NIZORAL) 2 % shampoo Apply topically.    . Lancets (ONETOUCH ULTRASOFT) lancets E11.65 Use as instructed to check sugars three time daily and as needed 100 each 12  . levothyroxine (SYNTHROID) 137 MCG tablet TAKE 1 TABLET BY MOUTH ONCE DAILY BEFORE BREAKFAST (Patient taking differently: Take 137 mcg by mouth daily before breakfast. ) 30 tablet 11  . methylphenidate (RITALIN) 5 MG tablet Take 1 tablet (5 mg total) by mouth 2 (two) times daily with breakfast and lunch. 60 tablet 0  . metoprolol succinate (TOPROL-XL) 50 MG 24 hr tablet TAKE 1 AND 1/2 TABLET (75 MG) BY MOUTH EVERY DAY WITH OR IMMEDIATELY FOLLOWING A MEAL *DO NOT CRUSH* 50 tablet 11  . mineral oil-hydrophilic petrolatum (AQUAPHOR) ointment Apply topically.    . ondansetron (ZOFRAN-ODT) 4 MG disintegrating tablet Take 1 tablet (4 mg total) by mouth every 8 (eight) hours as needed for nausea or vomiting. 60 tablet 2  . polyethylene glycol (MIRALAX / GLYCOLAX) 17 g packet Take 17 g by mouth 2 (two) times daily. (Patient taking differently: Take 17 g by mouth daily. ) 14 each 0  . sertraline (ZOLOFT) 100 MG tablet Take 100 mg by mouth daily.    . sertraline (ZOLOFT) 50 MG tablet Take 1 tablet (50 mg total) by mouth daily. Take with breakfast.  Initiate 81m dose after completing 5 days of 240m. (Patient taking differently: Take 100 mg by mouth daily.  Patient was increased to 2/day (100 mg)) 90 tablet 3  . sodium chloride (ALTAMIST SPRAY) 0.65 % nasal spray Place into the nose.    . testosterone cypionate (DEPOTESTOSTERONE CYPIONATE) 200 MG/ML injection Inject 0.5 mLs (100 mg total) into the muscle every 14 (fourteen) days. 10 mL 0  . traZODone (DESYREL) 50 MG tablet Take 0.5-1 tablets (25-50 mg total) by mouth at bedtime. (Patient taking differently: Take 25 mg by mouth at bedtime as needed for sleep. ) 30 tablet 1  . Turmeric Curcumin 500 MG CAPS Take by mouth.    . Marland Kitchenllopurinol (ZYLOPRIM) 100 MG tablet Take 2 tablets (200 mg total) by mouth daily. 180 tablet 1   No current facility-administered medications for this visit.   Facility-Administered Medications Ordered in Other Visits  Medication Dose Route Frequency Provider Last Rate Last Admin  . 0.9 %  sodium chloride infusion  1,000 mL Intravenous Once FeTruitt MerleMD      . arsenic trioxide (TRISENOX) 13.3 mg in sodium chloride 0.9 % 250 mL chemo Infusion  0.15 mg/kg/day (Treatment Plan Recorded) Intravenous Daily FiLloyd HugerMD   Stopped at 04/21/19 1404  . arsenic trioxide (TRISENOX) 13.3 mg in sodium chloride 0.9 % 250 mL chemo Infusion  0.15 mg/kg/day (Treatment Plan Recorded) Intravenous Daily FiLloyd HugerMD 132 mL/hr at 05/09/19 1340 13.3 mg at 05/09/19 1340  . sodium chloride flush (NS) 0.9 % injection 10 mL  10 mL Intracatheter PRN FiLloyd HugerMD   10 mL at 05/09/19 1318    OBJECTIVE: Vitals:   09/25/19 1441  BP: 122/86  Pulse: 74  Temp: (!) 97 F (36.1 C)  SpO2: 98%     Body mass index is 29.42 kg/m.    ECOG FS:2 - Symptomatic, <50% confined to bed  General: Well-developed, well-nourished, no acute distress. Eyes: Pink conjunctiva, anicteric sclera. HEENT: Normocephalic, moist mucous membranes. Lungs: No audible wheezing or coughing. Heart: Regular rate and rhythm. Abdomen: Soft, nontender, no obvious distention. Musculoskeletal: No edema,  cyanosis, or clubbing.  5 out of 5 muscle strength throughout. Neuro: Alert, answering all questions appropriately. Cranial nerves grossly intact. Skin: No rashes or petechiae noted. Psych: Normal affect.  LAB RESULTS:  Lab Results  Component Value Date   NA 137 09/26/2019   K 4.4 09/26/2019   CL 100 09/26/2019   CO2 26 09/26/2019   GLUCOSE 82 09/26/2019   BUN 52 (H) 09/26/2019   CREATININE 2.23 (H) 09/26/2019   CALCIUM 9.7 09/26/2019   PROT 7.0 09/26/2019   ALBUMIN 4.2 09/26/2019   AST 16 09/26/2019   ALT 15 09/26/2019   ALKPHOS 51 09/26/2019   BILITOT 0.6 09/26/2019   GFRNONAA 27 (L) 09/26/2019   GFRAA 31 (L) 09/26/2019    Lab Results  Component Value Date   WBC 4.3 09/26/2019   NEUTROABS 3.1 09/26/2019   HGB 14.3 09/26/2019   HCT 41.9 09/26/2019   MCV 93.7 09/26/2019   PLT 174 09/26/2019     STUDIES: No results found.  ASSESSMENT: Acute promyelocytic leukemia in remission  PLAN:    1. Acute promyelocytic leukemia in remission: Patient completed his induction chemotherapy at [facility] in approximately November 2020.  Peripheral blood FISH-PML t(15:17) noted complete molecular remission.  Patient only completed 1 cycle of maintenance chemotherapy consisting of ATRA 14 days on 14 days off along with arsenic trioxide at 0.15 mg/kg/day Monday through Friday for 28 days prior to discontinuing treatment permanently secondary to declining performance status.  No intervention is needed at this time.  Peripheral blood PCR is pending at time of dictation.  Return to clinic in 2 months with repeat laboratory work and further evaluation. 2.  Nausea: Patient does not complain of this today.  Continue ondansetron as needed. 3.  Cardiac disease/biventricular pacer: Given patient's biventricular pacing, he has a significantly abnormal EKG with a prolonged QTC of approximately 560. Continue close follow-up with cardiology as scheduled.  4.  Melanoma: No obvious evidence of  recurrence.  Monitor.  Continue follow-up with neuro-oncology as scheduled. 5.  Prostate cancer: Patient's most recent PSA in June 2020 was reported at 4.35. 6: Renal insufficiency: Patient's creatinine is worse today at 2.23.  Have instructed patient to discontinue Lasix and only use as needed for peripheral edema and encouraged fluid intake. 7.  Peripheral edema: Resolved.  Use Lasix sparingly and only as needed. 8.  Weakness and fatigue: Multifactorial.  In addition to discontinuing Lasix, have recommended patient continue physical therapy and increase physical activity at gym.  Patient expressed understanding and was in agreement with this plan. He also understands that He can call clinic at any time with any questions, concerns, or complaints.     [name], MD   09/28/2019 8:38 AM

## 2019-09-25 ENCOUNTER — Other Ambulatory Visit: Payer: Self-pay | Admitting: Emergency Medicine

## 2019-09-25 ENCOUNTER — Encounter: Payer: Self-pay | Admitting: Oncology

## 2019-09-25 DIAGNOSIS — C9241 Acute promyelocytic leukemia, in remission: Secondary | ICD-10-CM

## 2019-09-25 NOTE — Progress Notes (Signed)
Pt coming in for follow up, no complaints or concerns.

## 2019-09-26 ENCOUNTER — Inpatient Hospital Stay (HOSPITAL_BASED_OUTPATIENT_CLINIC_OR_DEPARTMENT_OTHER): Payer: Medicare Other | Admitting: Oncology

## 2019-09-26 ENCOUNTER — Other Ambulatory Visit: Payer: Self-pay

## 2019-09-26 ENCOUNTER — Inpatient Hospital Stay: Payer: Medicare Other

## 2019-09-26 VITALS — BP 122/86 | HR 74 | Temp 97.0°F | Wt 199.2 lb

## 2019-09-26 DIAGNOSIS — C9241 Acute promyelocytic leukemia, in remission: Secondary | ICD-10-CM

## 2019-09-26 DIAGNOSIS — Z8673 Personal history of transient ischemic attack (TIA), and cerebral infarction without residual deficits: Secondary | ICD-10-CM | POA: Diagnosis not present

## 2019-09-26 DIAGNOSIS — I255 Ischemic cardiomyopathy: Secondary | ICD-10-CM

## 2019-09-26 DIAGNOSIS — R531 Weakness: Secondary | ICD-10-CM | POA: Diagnosis not present

## 2019-09-26 DIAGNOSIS — Z7982 Long term (current) use of aspirin: Secondary | ICD-10-CM | POA: Diagnosis not present

## 2019-09-26 DIAGNOSIS — Z801 Family history of malignant neoplasm of trachea, bronchus and lung: Secondary | ICD-10-CM | POA: Diagnosis not present

## 2019-09-26 DIAGNOSIS — Z8546 Personal history of malignant neoplasm of prostate: Secondary | ICD-10-CM | POA: Diagnosis not present

## 2019-09-26 DIAGNOSIS — E039 Hypothyroidism, unspecified: Secondary | ICD-10-CM | POA: Diagnosis not present

## 2019-09-26 DIAGNOSIS — N400 Enlarged prostate without lower urinary tract symptoms: Secondary | ICD-10-CM | POA: Diagnosis not present

## 2019-09-26 DIAGNOSIS — I252 Old myocardial infarction: Secondary | ICD-10-CM | POA: Diagnosis not present

## 2019-09-26 DIAGNOSIS — Z87891 Personal history of nicotine dependence: Secondary | ICD-10-CM | POA: Diagnosis not present

## 2019-09-26 DIAGNOSIS — Z79899 Other long term (current) drug therapy: Secondary | ICD-10-CM | POA: Diagnosis not present

## 2019-09-26 DIAGNOSIS — K219 Gastro-esophageal reflux disease without esophagitis: Secondary | ICD-10-CM | POA: Diagnosis not present

## 2019-09-26 DIAGNOSIS — Z8249 Family history of ischemic heart disease and other diseases of the circulatory system: Secondary | ICD-10-CM | POA: Diagnosis not present

## 2019-09-26 DIAGNOSIS — Z85820 Personal history of malignant melanoma of skin: Secondary | ICD-10-CM | POA: Diagnosis not present

## 2019-09-26 DIAGNOSIS — N289 Disorder of kidney and ureter, unspecified: Secondary | ICD-10-CM | POA: Diagnosis not present

## 2019-09-26 DIAGNOSIS — E7800 Pure hypercholesterolemia, unspecified: Secondary | ICD-10-CM | POA: Diagnosis not present

## 2019-09-26 DIAGNOSIS — I2510 Atherosclerotic heart disease of native coronary artery without angina pectoris: Secondary | ICD-10-CM | POA: Diagnosis not present

## 2019-09-26 DIAGNOSIS — I10 Essential (primary) hypertension: Secondary | ICD-10-CM | POA: Diagnosis not present

## 2019-09-26 DIAGNOSIS — Z8261 Family history of arthritis: Secondary | ICD-10-CM | POA: Diagnosis not present

## 2019-09-26 LAB — CBC WITH DIFFERENTIAL/PLATELET
Abs Immature Granulocytes: 0.01 10*3/uL (ref 0.00–0.07)
Basophils Absolute: 0 10*3/uL (ref 0.0–0.1)
Basophils Relative: 1 %
Eosinophils Absolute: 0.2 10*3/uL (ref 0.0–0.5)
Eosinophils Relative: 4 %
HCT: 41.9 % (ref 39.0–52.0)
Hemoglobin: 14.3 g/dL (ref 13.0–17.0)
Immature Granulocytes: 0 %
Lymphocytes Relative: 13 %
Lymphs Abs: 0.5 10*3/uL — ABNORMAL LOW (ref 0.7–4.0)
MCH: 32 pg (ref 26.0–34.0)
MCHC: 34.1 g/dL (ref 30.0–36.0)
MCV: 93.7 fL (ref 80.0–100.0)
Monocytes Absolute: 0.5 10*3/uL (ref 0.1–1.0)
Monocytes Relative: 12 %
Neutro Abs: 3.1 10*3/uL (ref 1.7–7.7)
Neutrophils Relative %: 70 %
Platelets: 174 10*3/uL (ref 150–400)
RBC: 4.47 MIL/uL (ref 4.22–5.81)
RDW: 13.8 % (ref 11.5–15.5)
WBC: 4.3 10*3/uL (ref 4.0–10.5)
nRBC: 0 % (ref 0.0–0.2)

## 2019-09-26 LAB — COMPREHENSIVE METABOLIC PANEL
ALT: 15 U/L (ref 0–44)
AST: 16 U/L (ref 15–41)
Albumin: 4.2 g/dL (ref 3.5–5.0)
Alkaline Phosphatase: 51 U/L (ref 38–126)
Anion gap: 11 (ref 5–15)
BUN: 52 mg/dL — ABNORMAL HIGH (ref 8–23)
CO2: 26 mmol/L (ref 22–32)
Calcium: 9.7 mg/dL (ref 8.9–10.3)
Chloride: 100 mmol/L (ref 98–111)
Creatinine, Ser: 2.23 mg/dL — ABNORMAL HIGH (ref 0.61–1.24)
GFR calc Af Amer: 31 mL/min — ABNORMAL LOW (ref >60)
GFR calc non Af Amer: 27 mL/min — ABNORMAL LOW (ref >60)
Glucose, Bld: 82 mg/dL (ref 70–99)
Potassium: 4.4 mmol/L (ref 3.5–5.1)
Sodium: 137 mmol/L (ref 135–145)
Total Bilirubin: 0.6 mg/dL (ref 0.3–1.2)
Total Protein: 7 g/dL (ref 6.5–8.1)

## 2019-09-26 MED ORDER — HEPARIN SOD (PORK) LOCK FLUSH 100 UNIT/ML IV SOLN
500.0000 [IU] | Freq: Once | INTRAVENOUS | Status: AC
  Administered 2019-09-26: 500 [IU] via INTRAVENOUS
  Filled 2019-09-26: qty 5

## 2019-09-26 MED ORDER — HEPARIN SOD (PORK) LOCK FLUSH 100 UNIT/ML IV SOLN
INTRAVENOUS | Status: AC
  Filled 2019-09-26: qty 5

## 2019-09-26 MED ORDER — SODIUM CHLORIDE 0.9% FLUSH
10.0000 mL | Freq: Once | INTRAVENOUS | Status: AC
  Administered 2019-09-26: 10 mL via INTRAVENOUS
  Filled 2019-09-26: qty 10

## 2019-09-26 NOTE — Progress Notes (Signed)
Unable to obtain blood return from pt's port today. Dr Grayland Ormond notified via secure chat. Pt's labs to be drawn peripherally today.

## 2019-09-30 ENCOUNTER — Ambulatory Visit (INDEPENDENT_AMBULATORY_CARE_PROVIDER_SITE_OTHER): Payer: Medicare Other | Admitting: *Deleted

## 2019-09-30 DIAGNOSIS — I255 Ischemic cardiomyopathy: Secondary | ICD-10-CM | POA: Diagnosis not present

## 2019-09-30 LAB — CUP PACEART REMOTE DEVICE CHECK
Battery Remaining Longevity: 75 mo
Battery Remaining Percentage: 95.5 %
Battery Voltage: 2.98 V
Brady Statistic AP VP Percent: 99 %
Brady Statistic AP VS Percent: 1 %
Brady Statistic AS VP Percent: 1 %
Brady Statistic AS VS Percent: 1 %
Brady Statistic RA Percent Paced: 99 %
Date Time Interrogation Session: 20210622020010
Implantable Lead Implant Date: 19980107
Implantable Lead Implant Date: 19980107
Implantable Lead Implant Date: 20141118
Implantable Lead Location: 753858
Implantable Lead Location: 753859
Implantable Lead Location: 753860
Implantable Pulse Generator Implant Date: 20201223
Lead Channel Impedance Value: 1075 Ohm
Lead Channel Impedance Value: 380 Ohm
Lead Channel Impedance Value: 450 Ohm
Lead Channel Pacing Threshold Amplitude: 0.625 V
Lead Channel Pacing Threshold Amplitude: 1.125 V
Lead Channel Pacing Threshold Amplitude: 1.375 V
Lead Channel Pacing Threshold Pulse Width: 0.5 ms
Lead Channel Pacing Threshold Pulse Width: 0.5 ms
Lead Channel Pacing Threshold Pulse Width: 1 ms
Lead Channel Sensing Intrinsic Amplitude: 3.3 mV
Lead Channel Setting Pacing Amplitude: 2 V
Lead Channel Setting Pacing Amplitude: 2.125
Lead Channel Setting Pacing Amplitude: 2.375
Lead Channel Setting Pacing Pulse Width: 0.5 ms
Lead Channel Setting Pacing Pulse Width: 1 ms
Lead Channel Setting Sensing Sensitivity: 4 mV
Pulse Gen Model: 3562
Pulse Gen Serial Number: 6033885

## 2019-10-02 NOTE — Progress Notes (Signed)
Remote pacemaker transmission.   

## 2019-10-06 ENCOUNTER — Other Ambulatory Visit: Payer: Self-pay | Admitting: *Deleted

## 2019-10-06 NOTE — Progress Notes (Signed)
Received continuation of therapy Rx from [facility] with [facility] out of [place].   Faxed refill to [phone]

## 2019-10-14 ENCOUNTER — Telehealth: Payer: Self-pay | Admitting: Family Medicine

## 2019-10-14 DIAGNOSIS — E1169 Type 2 diabetes mellitus with other specified complication: Secondary | ICD-10-CM

## 2019-10-14 DIAGNOSIS — C9241 Acute promyelocytic leukemia, in remission: Secondary | ICD-10-CM

## 2019-10-14 DIAGNOSIS — E039 Hypothyroidism, unspecified: Secondary | ICD-10-CM

## 2019-10-14 NOTE — Addendum Note (Signed)
Addended by: [name] on: 10/14/2019 05:05 PM   Modules accepted: Orders

## 2019-10-14 NOTE — Telephone Encounter (Addendum)
Received request for one-time add-on cancer center labs for APML.

## 2019-10-14 NOTE — Telephone Encounter (Signed)
Received note from daughter Dr [name]:  Dr. Grayland Ormond drew labs recently. His creatinine was up. We thoughtit could be dehydration and we changed the lasix to prn instead of every other day. Can you have him come in for a rpt CMP and also draw a TSH and Hgb A1C. His caretaker's number is [phone] ([place]). She can schedule him when your office calls.    plz call caregiver today to schedule rpt lab visit at their convenience sometime early this week.

## 2019-10-14 NOTE — Telephone Encounter (Signed)
Pt is scheduled for labs on 10/17/19 at 10:15.

## 2019-10-15 ENCOUNTER — Other Ambulatory Visit: Payer: Self-pay | Admitting: Emergency Medicine

## 2019-10-15 ENCOUNTER — Other Ambulatory Visit: Payer: Self-pay

## 2019-10-15 ENCOUNTER — Inpatient Hospital Stay: Payer: Medicare Other | Attending: Oncology

## 2019-10-15 DIAGNOSIS — E0865 Diabetes mellitus due to underlying condition with hyperglycemia: Secondary | ICD-10-CM

## 2019-10-15 DIAGNOSIS — C9241 Acute promyelocytic leukemia, in remission: Secondary | ICD-10-CM

## 2019-10-15 DIAGNOSIS — E039 Hypothyroidism, unspecified: Secondary | ICD-10-CM | POA: Insufficient documentation

## 2019-10-15 LAB — CBC WITH DIFFERENTIAL/PLATELET
Abs Immature Granulocytes: 0.02 10*3/uL (ref 0.00–0.07)
Basophils Absolute: 0 10*3/uL (ref 0.0–0.1)
Basophils Relative: 1 %
Eosinophils Absolute: 0.1 10*3/uL (ref 0.0–0.5)
Eosinophils Relative: 3 %
HCT: 41.8 % (ref 39.0–52.0)
Hemoglobin: 14.3 g/dL (ref 13.0–17.0)
Immature Granulocytes: 0 %
Lymphocytes Relative: 13 %
Lymphs Abs: 0.6 10*3/uL — ABNORMAL LOW (ref 0.7–4.0)
MCH: 31.7 pg (ref 26.0–34.0)
MCHC: 34.2 g/dL (ref 30.0–36.0)
MCV: 92.7 fL (ref 80.0–100.0)
Monocytes Absolute: 0.4 10*3/uL (ref 0.1–1.0)
Monocytes Relative: 8 %
Neutro Abs: 3.4 10*3/uL (ref 1.7–7.7)
Neutrophils Relative %: 75 %
Platelets: 161 10*3/uL (ref 150–400)
RBC: 4.51 MIL/uL (ref 4.22–5.81)
RDW: 14 % (ref 11.5–15.5)
WBC: 4.5 10*3/uL (ref 4.0–10.5)
nRBC: 0 % (ref 0.0–0.2)

## 2019-10-15 LAB — COMPREHENSIVE METABOLIC PANEL
ALT: 15 U/L (ref 0–44)
AST: 20 U/L (ref 15–41)
Albumin: 4 g/dL (ref 3.5–5.0)
Alkaline Phosphatase: 45 U/L (ref 38–126)
Anion gap: 9 (ref 5–15)
BUN: 32 mg/dL — ABNORMAL HIGH (ref 8–23)
CO2: 27 mmol/L (ref 22–32)
Calcium: 9.4 mg/dL (ref 8.9–10.3)
Chloride: 103 mmol/L (ref 98–111)
Creatinine, Ser: 1.91 mg/dL — ABNORMAL HIGH (ref 0.61–1.24)
GFR calc Af Amer: 38 mL/min — ABNORMAL LOW (ref >60)
GFR calc non Af Amer: 32 mL/min — ABNORMAL LOW (ref >60)
Glucose, Bld: 117 mg/dL — ABNORMAL HIGH (ref 70–99)
Potassium: 4.5 mmol/L (ref 3.5–5.1)
Sodium: 139 mmol/L (ref 135–145)
Total Bilirubin: 0.6 mg/dL (ref 0.3–1.2)
Total Protein: 6.7 g/dL (ref 6.5–8.1)

## 2019-10-15 LAB — TSH: TSH: 1.75 u[IU]/mL (ref 0.350–4.500)

## 2019-10-15 LAB — HEMOGLOBIN A1C
Hgb A1c MFr Bld: 5.6 % (ref 4.8–5.6)
Mean Plasma Glucose: 114.02 mg/dL

## 2019-10-17 ENCOUNTER — Other Ambulatory Visit: Payer: Medicare Other

## 2019-10-17 LAB — MISC LABCORP TEST (SEND OUT): Labcorp test code: 511834

## 2019-10-20 LAB — PML/RAR TRANSLOCATION BY PCR

## 2019-10-23 ENCOUNTER — Other Ambulatory Visit: Payer: Self-pay | Admitting: Internal Medicine

## 2019-10-23 DIAGNOSIS — L988 Other specified disorders of the skin and subcutaneous tissue: Secondary | ICD-10-CM | POA: Diagnosis not present

## 2019-10-23 DIAGNOSIS — C4372 Malignant melanoma of left lower limb, including hip: Secondary | ICD-10-CM | POA: Diagnosis not present

## 2019-10-23 DIAGNOSIS — Z85820 Personal history of malignant melanoma of skin: Secondary | ICD-10-CM | POA: Diagnosis not present

## 2019-10-23 DIAGNOSIS — Z85828 Personal history of other malignant neoplasm of skin: Secondary | ICD-10-CM | POA: Diagnosis not present

## 2019-10-28 ENCOUNTER — Other Ambulatory Visit: Payer: Self-pay | Admitting: *Deleted

## 2019-10-28 DIAGNOSIS — C439 Malignant melanoma of skin, unspecified: Secondary | ICD-10-CM

## 2019-10-30 DIAGNOSIS — C9590 Leukemia, unspecified not having achieved remission: Secondary | ICD-10-CM | POA: Diagnosis not present

## 2019-10-30 DIAGNOSIS — R262 Difficulty in walking, not elsewhere classified: Secondary | ICD-10-CM | POA: Diagnosis not present

## 2019-10-30 DIAGNOSIS — C9241 Acute promyelocytic leukemia, in remission: Secondary | ICD-10-CM | POA: Diagnosis not present

## 2019-11-03 DIAGNOSIS — C9241 Acute promyelocytic leukemia, in remission: Secondary | ICD-10-CM | POA: Diagnosis not present

## 2019-11-03 DIAGNOSIS — C9590 Leukemia, unspecified not having achieved remission: Secondary | ICD-10-CM | POA: Diagnosis not present

## 2019-11-03 DIAGNOSIS — R262 Difficulty in walking, not elsewhere classified: Secondary | ICD-10-CM | POA: Diagnosis not present

## 2019-11-04 ENCOUNTER — Other Ambulatory Visit: Payer: Self-pay | Admitting: *Deleted

## 2019-11-04 ENCOUNTER — Other Ambulatory Visit: Payer: Self-pay | Admitting: Internal Medicine

## 2019-11-04 MED ORDER — METHYLPHENIDATE HCL 5 MG PO TABS: 5.0000 mg | ORAL_TABLET | Freq: Two times a day (BID) | ORAL | 0 refills | Status: DC

## 2019-11-05 DIAGNOSIS — R262 Difficulty in walking, not elsewhere classified: Secondary | ICD-10-CM | POA: Diagnosis not present

## 2019-11-05 DIAGNOSIS — C9590 Leukemia, unspecified not having achieved remission: Secondary | ICD-10-CM | POA: Diagnosis not present

## 2019-11-05 DIAGNOSIS — C9241 Acute promyelocytic leukemia, in remission: Secondary | ICD-10-CM | POA: Diagnosis not present

## 2019-11-07 DIAGNOSIS — C9241 Acute promyelocytic leukemia, in remission: Secondary | ICD-10-CM | POA: Diagnosis not present

## 2019-11-07 DIAGNOSIS — R262 Difficulty in walking, not elsewhere classified: Secondary | ICD-10-CM | POA: Diagnosis not present

## 2019-11-07 DIAGNOSIS — C9590 Leukemia, unspecified not having achieved remission: Secondary | ICD-10-CM | POA: Diagnosis not present

## 2019-11-10 DIAGNOSIS — R262 Difficulty in walking, not elsewhere classified: Secondary | ICD-10-CM | POA: Diagnosis not present

## 2019-11-10 DIAGNOSIS — C9590 Leukemia, unspecified not having achieved remission: Secondary | ICD-10-CM | POA: Diagnosis not present

## 2019-11-10 DIAGNOSIS — C9241 Acute promyelocytic leukemia, in remission: Secondary | ICD-10-CM | POA: Diagnosis not present

## 2019-11-12 DIAGNOSIS — C9241 Acute promyelocytic leukemia, in remission: Secondary | ICD-10-CM | POA: Diagnosis not present

## 2019-11-12 DIAGNOSIS — C9590 Leukemia, unspecified not having achieved remission: Secondary | ICD-10-CM | POA: Diagnosis not present

## 2019-11-12 DIAGNOSIS — R262 Difficulty in walking, not elsewhere classified: Secondary | ICD-10-CM | POA: Diagnosis not present

## 2019-11-14 DIAGNOSIS — C9590 Leukemia, unspecified not having achieved remission: Secondary | ICD-10-CM | POA: Diagnosis not present

## 2019-11-14 DIAGNOSIS — R262 Difficulty in walking, not elsewhere classified: Secondary | ICD-10-CM | POA: Diagnosis not present

## 2019-11-14 DIAGNOSIS — C9241 Acute promyelocytic leukemia, in remission: Secondary | ICD-10-CM | POA: Diagnosis not present

## 2019-11-20 ENCOUNTER — Inpatient Hospital Stay: Payer: Medicare Other | Admitting: Hospice and Palliative Medicine

## 2019-11-20 ENCOUNTER — Ambulatory Visit: Payer: PRIVATE HEALTH INSURANCE | Admitting: Podiatry

## 2019-11-21 ENCOUNTER — Other Ambulatory Visit: Payer: Self-pay | Admitting: Internal Medicine

## 2019-11-21 ENCOUNTER — Ambulatory Visit: Payer: Medicare Other

## 2019-11-21 DIAGNOSIS — C7931 Secondary malignant neoplasm of brain: Secondary | ICD-10-CM

## 2019-11-21 DIAGNOSIS — C439 Malignant melanoma of skin, unspecified: Secondary | ICD-10-CM

## 2019-11-22 NOTE — Progress Notes (Signed)
David [name]  [phone] [phone]  ID: [name] OB: [birth year]  MR#: [id number]  TKP#:[id number]  Patient Care Team: [name], MD as PCP - General (Family Medicine) Minna Merritts, MD as PCP - Cardiology (Cardiology) Deboraha Sprang, MD as PCP - Electrophysiology (Cardiology) Cira Rue, RN Nurse Navigator as Registered Nurse ([place]) Acquanetta Chain, DO as Consulting Physician [facility] and Palliative Medicine) [name], MD as Consulting Physician (Hematology and Oncology)  CHIEF COMPLAINT: Acute promyelocytic leukemia in remission  INTERVAL HISTORY: Patient returns to clinic today for repeat laboratory can further evaluation.  His performance status is improving.  He continues with physical therapy and his weakness and fatigue have also improved.  He currently feels well. He has no neurologic complaints.  He has a good appetite and denies weight loss.  He denies any recent fevers or illnesses. He denies any chest pain, shortness of breath, cough, or hemoptysis.  He denies any nausea, vomiting, constipation, or diarrhea.  He has no urinary complaints.  Patient offers no further specific complaints today.  REVIEW OF SYSTEMS:   Review of Systems  Constitutional: Positive for malaise/fatigue. Negative for fever and weight loss.  Respiratory: Negative.  Negative for cough, hemoptysis and shortness of breath.   Cardiovascular: Negative.  Negative for chest pain and leg swelling.  Gastrointestinal: Negative.  Negative for abdominal pain and nausea.  Genitourinary: Negative.  Negative for hematuria.  Musculoskeletal: Negative.  Negative for back pain.  Skin: Negative.  Negative for rash.  Neurological: Positive for weakness. Negative for dizziness, focal weakness and headaches.  Psychiatric/Behavioral: Negative.  The patient is not nervous/anxious.     As per HPI. Otherwise, a complete review of  systems is negative.  PAST MEDICAL HISTORY: Past Medical History:  Diagnosis Date  . Anginal pain ([place])   . Arthritis    mostly hands  . Benign familial hematuria   . BPH (benign prostatic hypertrophy)   . Brain cancer ([place])    melanoma with brain met  . Coronary artery disease   . GERD (gastroesophageal reflux disease)   . High cholesterol   . Hypertension   . Hypothyroidism   . Intracerebral hemorrhage ([place])   . Melanoma ([place])   . Metastatic melanoma to lung ([place])   . Mood disorder ([place])   . Myocardial infarction ([place])   . Pacemaker    due to syncope, 3rd degree HB (upgrade to [facility] Jude CRT-P 02/25/13 (Dr. Uvaldo Rising)  . Rectal bleed    due to NSAIDS  . Renal disorder   . Sepsis ([place]) 12/09/2018  . Skin cancer    melanoma  . Sleep apnea    uses CPAP  . Stroke [facility])     PAST SURGICAL HISTORY: Past Surgical History:  Procedure Laterality Date  . APPLICATION OF CRANIAL NAVIGATION N/A 10/15/2015   Procedure: APPLICATION OF CRANIAL NAVIGATION;  Surgeon: Erline Levine, MD;  Location: [place] NEURO ORS;  Service: Neurosurgery;  Laterality: N/A;  . BIV PACEMAKER GENERATOR CHANGEOUT N/A 04/02/2019   Procedure: BIV PACEMAKER GENERATOR CHANGEOUT;  Surgeon: Evans Lance, MD;  Location: [place] CV LAB;  Service: Cardiovascular;  Laterality: N/A;  . CARDIAC CATHETERIZATION  2013  . CARDIAC SURGERY     bypass X 2  . COLONOSCOPY WITH PROPOFOL N/A 05/08/2016   diverticulosis, int hem, no f/u needed [name])  . CORONARY ARTERY BYPASS GRAFT  2013   LIMA-LAD, SVG-PDA 03/27/11 (Dr. Francee Gentile)  . CRANIOTOMY N/A 10/15/2015  Procedure: LEFT FRONTAL CRANIOTOMY TUMOR EXCISION with Curve;  Surgeon: Erline Levine, MD;  Location: [place] NEURO ORS;  Service: Neurosurgery;  Laterality: N/A;  CRANIOTOMY TUMOR EXCISION  . EYE SURGERY    . FOOT SURGERY  08/2010  . JOINT REPLACEMENT Left    partial knee  . KNEE ARTHROSCOPY  12/2008  . LYMPH NODE BIOPSY    . PACEMAKER INSERTION    . TONSILLECTOMY      FAMILY  HISTORY: Family History  Problem Relation Age of Onset  . Cancer Mother 24       lung   . Hypertension Mother   . Hypertension Father   . Heart attack Father   . Heart disease Father   . Rheum arthritis Sister   . Cancer Maternal Grandmother     ADVANCED DIRECTIVES (Y/N):  N  HEALTH MAINTENANCE: Social History   Tobacco Use  . Smoking status: Former Smoker    Packs/day: 1.00    Years: 3.00    Pack years: 3.00    Quit date: 04/10/1957    Years since quitting: 62.6  . Smokeless tobacco: Never Used  Vaping Use  . Vaping Use: Never used  Substance Use Topics  . Alcohol use: Yes    Alcohol/week: 14.0 standard drinks    Types: 10 Glasses of wine, 4 Cans of beer per week  . Drug use: No     Colonoscopy:  PAP:  Bone density:  Lipid panel:  Allergies  Allergen Reactions  . Ezetimibe Other (See Comments)    Unknown reaction  . Simvastatin Other (See Comments)    Reaction:  Gave pt a fever  Fever - temp of 103.   In 2003    Current Outpatient Medications  Medication Sig Dispense Refill  . acetaminophen (TYLENOL) 325 MG tablet Take 2 tablets (650 mg total) by mouth every 6 (six) hours as needed for mild pain (or Fever >/= 101).    Marland Kitchen aspirin EC 81 MG tablet Take 81 mg by mouth every evening.     . Blood Glucose Monitoring Suppl (ONETOUCH VERIO) w/Device KIT 1 kit by Does not apply route as directed. E11.65 1 kit 0  . Cholecalciferol (VITAMIN D3) 25 MCG (1000 UT) CAPS Take 1 capsule (1,000 Units total) by mouth daily. 30 capsule 0  . glucose blood test strip E11.65 Use as instructed to check sugars three time daily and as needed 100 each 12  . HUMALOG KWIKPEN 100 UNIT/ML KwikPen     . ketoconazole (NIZORAL) 2 % shampoo Apply topically.     . Lancets (ONETOUCH ULTRASOFT) lancets E11.65 Use as instructed to check sugars three time daily and as needed 100 each 12  . levothyroxine (SYNTHROID) 137 MCG tablet TAKE 1 TABLET BY MOUTH ONCE DAILY BEFORE BREAKFAST (Patient taking  differently: Take 137 mcg by mouth daily before breakfast. ) 30 tablet 11  . methylphenidate (RITALIN) 5 MG tablet Take 1 tablet (5 mg total) by mouth 2 (two) times daily with breakfast and lunch. 60 tablet 0  . metoprolol succinate (TOPROL-XL) 50 MG 24 hr tablet TAKE 1 AND 1/2 TABLET (75 MG) BY MOUTH EVERY DAY WITH OR IMMEDIATELY FOLLOWING A MEAL *DO NOT CRUSH* 50 tablet 11  . polyethylene glycol (MIRALAX / GLYCOLAX) 17 g packet Take 17 g by mouth 2 (two) times daily. 14 each 0  . sertraline (ZOLOFT) 50 MG tablet Take 1 tablet (50 mg total) by mouth daily. Take with breakfast.  Initiate 33m dose after completing 5 days  of 81m . 90 tablet 3  . testosterone cypionate (DEPOTESTOSTERONE CYPIONATE) 200 MG/ML injection Inject 0.5 mLs (100 mg total) into the muscle every 14 (fourteen) days. 10 mL 0  . traZODone (DESYREL) 50 MG tablet Take 0.5-1 tablets (25-50 mg total) by mouth at bedtime. (Patient taking differently: Take 25 mg by mouth at bedtime as needed for sleep. ) 30 tablet 1  . Turmeric Curcumin 500 MG CAPS Take by mouth.     No current facility-administered medications for this visit.   Facility-Administered Medications Ordered in Other Visits  Medication Dose Route Frequency Provider Last Rate Last Admin  . 0.9 %  sodium chloride infusion  1,000 mL Intravenous Once FTruitt Merle MD        OBJECTIVE: Vitals:   11/28/19 1447  BP: (!) 133/96  Pulse: 75  Resp: 18  Temp: (!) 97.1 F (36.2 C)     Body mass index is 29.82 kg/m.    ECOG FS:1 - Symptomatic but completely ambulatory  General: Well-developed, well-nourished, no acute distress.  Sitting in a wheelchair. Eyes: Pink conjunctiva, anicteric sclera. HEENT: Normocephalic, moist mucous membranes. Lungs: No audible wheezing or coughing. Heart: Regular rate and rhythm. Abdomen: Soft, nontender, no obvious distention. Musculoskeletal: No edema, cyanosis, or clubbing. Neuro: Alert, answering all questions appropriately. Cranial nerves  grossly intact. Skin: No rashes or petechiae noted. Psych: Normal affect.  LAB RESULTS:  Lab Results  Component Value Date   NA 136 11/28/2019   K 4.9 11/28/2019   CL 103 11/28/2019   CO2 26 11/28/2019   GLUCOSE 85 11/28/2019   BUN 36 (H) 11/28/2019   CREATININE 1.72 (H) 11/28/2019   CALCIUM 9.1 11/28/2019   PROT 6.7 11/28/2019   ALBUMIN 3.8 11/28/2019   AST 19 11/28/2019   ALT 15 11/28/2019   ALKPHOS 50 11/28/2019   BILITOT 0.6 11/28/2019   GFRNONAA 37 (L) 11/28/2019   GFRAA 43 (L) 11/28/2019    Lab Results  Component Value Date   WBC 5.0 11/28/2019   NEUTROABS 3.9 11/28/2019   HGB 15.1 11/28/2019   HCT 43.7 11/28/2019   MCV 93.2 11/28/2019   PLT 152 11/28/2019     STUDIES: CT HEAD W & WO CONTRAST  Result Date: 11/24/2019 CLINICAL DATA:  Stage IV melanoma, follow-up. EXAM: CT HEAD WITHOUT AND WITH CONTRAST TECHNIQUE: Contiguous axial images were obtained from the base of the skull through the vertex without and with intravenous contrast CONTRAST:  64mOMNIPAQUE IOHEXOL 300 MG/ML  SOLN COMPARISON:  05/14/2019 FINDINGS: Brain: Postoperative left frontal lobe with volume loss. Stable extent of white matter low-density which is considered gliosis given volume loss. Minimal nodular enhancement along the lateral aspect of the ballooned frontal horn of the lateral ventricle is stable and likely scarring. Still no enhancement at the previously positive right cerebellar site. No new enhancement or masslike area. No interval infarct or collection. Vascular: Major vessels are enhancing. Skull: Unremarkable left-sided craniotomy. No evidence of aggressive bone lesion. Sinuses/Orbits: Scleral band on the left. Bilateral cataract resection. IMPRESSION: Stable from 05/14/2019.  No evidence of active disease. Electronically Signed   By: JoMonte Fantasia.D.   On: 11/24/2019 11:16   NM PET Image Restage (PS) Whole Body  Result Date: 11/26/2019 CLINICAL DATA:  Initial treatment strategy  for acute promyelocytic leukemia in remission, completed induction chemotherapy November 2020. Malignant melanoma. EXAM: NUCLEAR MEDICINE PET WHOLE BODY TECHNIQUE: 10.8 mCi F-18 FDG was injected intravenously. Full-ring PET imaging was performed from the head to foot after  the radiotracer. CT data was obtained and used for attenuation correction and anatomic localization. Fasting blood glucose: 73 mg/dl COMPARISON:  01/05/2019 CT chest, abdomen and pelvis. 09/18/2018 PET-CT. FINDINGS: Mediastinal blood pool activity: SUV max 2.7 HEAD/NECK: No hypermetabolic activity in the scalp. No hypermetabolic cervical lymph nodes. Mildly hypermetabolic (max SUV 4.5) left thyroid 0.9 cm nodule, not appreciably changed in size or metabolism. Incidental CT findings: Stable left frontal encephalomalacia with ex vacuo dilatation of the anterior horn of the left lateral ventricle. Stable postsurgical changes from left frontal craniotomy. CHEST: No enlarged or hypermetabolic axillary, mediastinal or hilar lymph nodes. No hypermetabolic pulmonary findings. Incidental CT findings: Right internal jugular Port-A-Cath terminates at the cavoatrial junction. 2 lead left subclavian pacemaker with lead tips in the right atrium and right ventricular apex. Coronary sinus. Mild cardiomegaly. Atherosclerotic thoracic aorta with ectatic 4.0 cm ascending thoracic aorta. Lingular 6 mm pulmonary nodule (series 4/image 152) is stable since 01/05/2019 chest CT and considered benign. No acute consolidative airspace disease or new significant pulmonary nodules. ABDOMEN/PELVIS: No abnormal hypermetabolic activity within the liver, pancreas, adrenal glands, or spleen. No hypermetabolic lymph nodes in the abdomen or pelvis. Incidental CT findings: Simple 2.8 cm lateral upper right renal cyst. Atherosclerotic abdominal aorta with stable 3.5 cm infrarenal abdominal aortic aneurysm using similar measurement technique. Marked diffuse colonic diverticulosis.  SKELETON: No focal hypermetabolic activity to suggest skeletal metastasis. Incidental CT findings: none EXTREMITIES: No abnormal hypermetabolic activity in the lower extremities. Incidental CT findings: none IMPRESSION: 1. No evidence of hypermetabolic metastatic disease. 2. Mildly hypermetabolic left thyroid 0.9 cm nodule, unchanged. Recommend thyroid US and biopsy if not previously performed (ref: J Am Coll Radiol. 2015 Feb;12(2): 143-50). 3. Infrarenal 23.5 cm abdominal aortic aneurysm. Recommend follow-up every 2 years. This recommendation follows ACR consensus guidelines: White Paper of the ACR Incidental Findings Committee II on Vascular Findings. J Am Coll Radiol 2013; 08:144-818. 4.  Aortic Atherosclerosis (ICD10-I70.0). Electronically Signed   By: Ilona Sorrel M.D.   On: 11/26/2019 18:03    ASSESSMENT: Acute promyelocytic leukemia in remission  PLAN:    1. Acute promyelocytic leukemia in remission: Patient completed his induction chemotherapy at [facility] in approximately November 2020.  Patient only completed 1 cycle of maintenance chemotherapy consisting of ATRA 14 days on 14 days off along with arsenic trioxide at 0.15 mg/kg/day Monday through Friday for 28 days prior to discontinuing treatment permanently secondary to declining performance status.  No intervention is needed at this time.  Peripheral blood PCR and FISH both revealed complete molecular remission.  Today's results are pending.  Return to clinic in 3 months with repeat laboratory work and further evaluation. 2.  Nausea: Patient does not complain of this today.  Continue ondansetron as needed. 3.  Cardiac disease/biventricular pacer: Given patient's biventricular pacing, he has a significantly abnormal EKG with a prolonged QTC of approximately 560. Continue close follow-up with cardiology as scheduled.  4.  Melanoma: PET scan results from November 26, 2019 reviewed independently and reported as above with no obvious evidence of  recurrent or progressive disease.  CT of head results from November 24, 2019 also revealed no evidence of active disease.  Continue follow-up with neuro-oncology as scheduled. 5.  Prostate cancer: Patient's most recent PSA in June 2020 was reported at 4.35. 6: Renal insufficiency: Improved.  Patient's creatinine is 1.72 today. 7.  Peripheral edema: Resolved.  Use Lasix sparingly and only as needed. 8.  Weakness and fatigue: Significantly improved.  Continue physical therapy as  ordered. 9.  Port removal: Will send referral to have port removed in the next several weeks.  Patient expressed understanding and was in agreement with this plan. He also understands that He can call clinic at any time with any questions, concerns, or complaints.     [name], MD   11/28/2019 3:36 PM

## 2019-11-24 ENCOUNTER — Other Ambulatory Visit: Payer: Self-pay | Admitting: *Deleted

## 2019-11-24 ENCOUNTER — Other Ambulatory Visit
Admission: RE | Admit: 2019-11-24 | Discharge: 2019-11-24 | Disposition: A | Discharge status: Home / Self Care | Payer: Medicare Other | Source: Home / Self Care | Attending: Internal Medicine | Admitting: Internal Medicine

## 2019-11-24 ENCOUNTER — Other Ambulatory Visit: Payer: Self-pay

## 2019-11-24 ENCOUNTER — Encounter: Payer: Self-pay | Admitting: Podiatry

## 2019-11-24 ENCOUNTER — Ambulatory Visit
Admission: RE | Admit: 2019-11-24 | Discharge: 2019-11-24 | Disposition: A | Discharge status: Home / Self Care | Payer: Medicare Other | Source: Ambulatory Visit | Attending: Internal Medicine | Admitting: Internal Medicine

## 2019-11-24 ENCOUNTER — Inpatient Hospital Stay: Payer: Medicare Other | Admitting: Hospice and Palliative Medicine

## 2019-11-24 ENCOUNTER — Ambulatory Visit (INDEPENDENT_AMBULATORY_CARE_PROVIDER_SITE_OTHER): Payer: Medicare Other | Admitting: Podiatry

## 2019-11-24 DIAGNOSIS — Z85820 Personal history of malignant melanoma of skin: Secondary | ICD-10-CM | POA: Diagnosis not present

## 2019-11-24 DIAGNOSIS — E119 Type 2 diabetes mellitus without complications: Secondary | ICD-10-CM | POA: Diagnosis not present

## 2019-11-24 DIAGNOSIS — E039 Hypothyroidism, unspecified: Secondary | ICD-10-CM | POA: Insufficient documentation

## 2019-11-24 DIAGNOSIS — G9389 Other specified disorders of brain: Secondary | ICD-10-CM | POA: Diagnosis not present

## 2019-11-24 DIAGNOSIS — E1169 Type 2 diabetes mellitus with other specified complication: Secondary | ICD-10-CM

## 2019-11-24 DIAGNOSIS — B351 Tinea unguium: Secondary | ICD-10-CM | POA: Diagnosis not present

## 2019-11-24 DIAGNOSIS — C7931 Secondary malignant neoplasm of brain: Secondary | ICD-10-CM | POA: Diagnosis not present

## 2019-11-24 DIAGNOSIS — C9241 Acute promyelocytic leukemia, in remission: Secondary | ICD-10-CM

## 2019-11-24 DIAGNOSIS — M79675 Pain in left toe(s): Secondary | ICD-10-CM

## 2019-11-24 DIAGNOSIS — R9082 White matter disease, unspecified: Secondary | ICD-10-CM | POA: Diagnosis not present

## 2019-11-24 DIAGNOSIS — M79674 Pain in right toe(s): Secondary | ICD-10-CM

## 2019-11-24 LAB — CREATININE, SERUM
Creatinine, Ser: 1.63 mg/dL — ABNORMAL HIGH (ref 0.61–1.24)
GFR calc Af Amer: 45 mL/min — ABNORMAL LOW (ref >60)
GFR calc non Af Amer: 39 mL/min — ABNORMAL LOW (ref >60)

## 2019-11-24 LAB — BUN: BUN: 41 mg/dL — ABNORMAL HIGH (ref 8–23)

## 2019-11-24 MED ORDER — IOHEXOL 300 MG/ML  SOLN
60.0000 mL | Freq: Once | INTRAMUSCULAR | Status: AC | PRN
  Administered 2019-11-24: 60 mL via INTRAVENOUS

## 2019-11-24 NOTE — Progress Notes (Signed)
This patient returns to my office for at risk foot care.  This patient requires this care by a professional since this patient will be at risk due to having diabetes type 2.    This patient is unable to cut nails himself since the patient cannot reach his nails.These nails are painful walking and wearing shoes.  This patient presents for at risk foot care today.  General Appearance  Alert, conversant and in no acute stress.  Vascular  Dorsalis pedis and posterior tibial  pulses are palpable  bilaterally.  Capillary return is within normal limits  bilaterally. Temperature is within normal limits  bilaterally.  Neurologic  [name]-Weinstein monofilament wire test within normal limits  bilaterally. Muscle power within normal limits bilaterally.  Nails Thick disfigured discolored nails with subungual debris  Hallux nails  B/l.Marland Kitchen No evidence of bacterial infection or drainage bilaterally.  Orthopedic  No limitations of motion  feet .  No crepitus or effusions noted.  No bony pathology or digital deformities noted.  Skin  normotropic skin with no porokeratosis noted bilaterally.  No signs of infections or ulcers noted.     Onychomycosis  Pain in right toes  Pain in left toes  Consent was obtained for treatment procedures.   Mechanical debridement of nails 1-5  bilaterally performed with a nail nipper.  Filed with dremel without incident.    Return office visit  4 months                    Told patient to return for periodic foot care and evaluation due to potential at risk complications.   Gardiner Barefoot DPM

## 2019-11-25 ENCOUNTER — Inpatient Hospital Stay: Payer: Medicare Other | Attending: Internal Medicine | Admitting: Internal Medicine

## 2019-11-25 ENCOUNTER — Other Ambulatory Visit: Payer: Self-pay

## 2019-11-25 VITALS — BP 127/90 | HR 88 | Temp 98.9°F | Resp 18 | Ht 69.0 in | Wt 199.6 lb

## 2019-11-25 DIAGNOSIS — C9240 Acute promyelocytic leukemia, not having achieved remission: Secondary | ICD-10-CM | POA: Insufficient documentation

## 2019-11-25 DIAGNOSIS — Z801 Family history of malignant neoplasm of trachea, bronchus and lung: Secondary | ICD-10-CM | POA: Diagnosis not present

## 2019-11-25 DIAGNOSIS — C439 Malignant melanoma of skin, unspecified: Secondary | ICD-10-CM | POA: Insufficient documentation

## 2019-11-25 DIAGNOSIS — G9340 Encephalopathy, unspecified: Secondary | ICD-10-CM | POA: Insufficient documentation

## 2019-11-25 DIAGNOSIS — G319 Degenerative disease of nervous system, unspecified: Secondary | ICD-10-CM | POA: Insufficient documentation

## 2019-11-25 DIAGNOSIS — C7951 Secondary malignant neoplasm of bone: Secondary | ICD-10-CM | POA: Diagnosis not present

## 2019-11-25 DIAGNOSIS — G3184 Mild cognitive impairment, so stated: Secondary | ICD-10-CM | POA: Diagnosis not present

## 2019-11-25 DIAGNOSIS — C773 Secondary and unspecified malignant neoplasm of axilla and upper limb lymph nodes: Secondary | ICD-10-CM | POA: Insufficient documentation

## 2019-11-25 DIAGNOSIS — Z809 Family history of malignant neoplasm, unspecified: Secondary | ICD-10-CM | POA: Insufficient documentation

## 2019-11-25 DIAGNOSIS — C7931 Secondary malignant neoplasm of brain: Secondary | ICD-10-CM

## 2019-11-25 NOTE — Progress Notes (Signed)
[place] [facility] [place], [place] [address] [phone]   Interval Evaluation  Date of Service: 11/25/19 Patient Name: David [name]. Patient MRN: [id number] Patient DOB: May 19, 1940 Provider: Ventura Sellers, MD  Identifying Statement:  [name] is a 79 y.o. male with melanoma, APML, brain metastases  Primary Cancer: Melanoma Stage IV  Oncologic History: Oncology History Overview Note  Metastatic melanoma [facility])   Staging form: Melanoma of the Skin, AJCC 7th Edition     Clinical stage from 09/21/2015: Stage IV [place], [place], [place]) - Signed by [name], MD on 10/09/2015     Malignant melanoma ([place])  09/16/2015 Imaging   PET scan showed a hypermetabolic 4.8JE lingular nodule, and a 7m right upper lobe lung nodule, left subclavicular nodes, and bone metastasis in the proximal right humerus and right femur.    09/21/2015 Initial Diagnosis   Metastatic melanoma ([place]   09/21/2015 Initial Biopsy   Left subclavicular lymph node biopsy showed prostatic of melanoma   09/21/2015 Miscellaneous   BRAF V600K mutation (+)    10/04/2015 Imaging   CT head with without contrast showed a 2.0 x 1.6 x 1.7 cm superficial left frontal hyperdense lesion, with postcontrast enhancement, lying within the previous hemorrhagic area, concerning for solitary melanoma metastasis   10/06/2015 - 09/06/2016 Chemotherapy   Nivolumab '240mg'$  every 2 weeks, held due to his dexamethasone for radionecrosis    10/13/2015 - 10/13/2015 Radiation Therapy   10/13/2015 Preop SRS treatment: 14 Gy  to the left frontal lesion in 1 fraction   10/15/2015 Surgery   left front craniotomy and tumor excision    10/15/2015 Pathology Results   left frontal brain mass excision showed metastatic melanoma, 1.5X1.5X0.6cm   01/13/2016 Imaging   CT head without and with contrast showed ill-defined enhancement and dural thickening of the left frontal resection cavity may represent a  post treatment changes or residual neoplasm. A new 6 mm hypodensity in the right cerebellar hemisphere with uncertainty enhancement may represent a metastasis or small brain parenchymal hemorrhage.    01/27/2016 - 01/27/2016 Radiation Therapy   01/27/16 SRS Treatment:  20 Gy in 1 fraction to a 6 mm right cerebellar brain met    02/2016 - 05/24/2018 Chemotherapy   Monthly Xgeva starting 02/2016, changed to every 3 months on 01/30/2018. Stopped on 05/24/2018   03/29/2016 PET scan   PET 03/29/2016 IMPRESSION: 1. Overall there has been an interval response to therapy. The index lesions sided on previous exam it are less hypermetabolic when compared with the previous exam. 2. There is an intramuscular lesion within the right masseter which has increased FDG uptake compared with previous exam. 3. No new sites of disease identified. 4. Aortic atherosclerosis and infrarenal abdominal aortic aneurysm. Recommend followup by ultrasound in 3 years. This recommendation follows ACR consensus guidelines: White Paper of the ACR Incidental Findings Committee II on Vascular Findings. JJoellyn RuedRadiol 2013; 10:789-794   04/20/2016 Imaging   CT Head w wo contrast 1. Mild patchy enhancement at the left anterior frontal lobe treatment site (series 11, image 39) without mass effect and stable surrounding white matter hypodensity without strong evidence of acute vasogenic edema. Recommend continued CT surveillance. 2. No new metastatic disease or new intracranial abnormality identified. 3. Probable benign 14 mm left parietal bone lucent lesion, unchanged since May 2017.   05/06/2016 - 05/08/2016 Hospital Admission   Pt was admitted for rectal bleeding for one day.  Breathing spontaneously resolved, hemoglobin dropped from 15 to 10.5, did not require blood transfusion. Colonoscopy showed no active bleeding, except diverticulosis and hemorrhoid. He was discharged to home.   05/08/2016 Procedure   Colonoscopy    - Stool in the ascending colon. Fluid aspiration performed. - Diverticulosis in the entire examined colon. - Non-bleeding internal hemorrhoids. - The examination was otherwise normal on direct and retroflexion views.   05/29/2016 Imaging   PET Image Restaging IMPRESSION: 1. Stable exam with no evidence progression. 2. Photopenia in the LEFT frontal lobe at prior surgical site. 3. Stable mildly hypermetabolic RIGHT paratracheal node. This may represent a thyroid nodule. 4. Stable size of minimally metabolic lingular pulmonary nodule. 5. Stable skeletal lesion of the RIGHT iliac crest 6. No cutaneous metabolic lesion.   07/20/2016 Imaging   CT Head W WO Contrast IMPRESSION: Pronounced change in the left frontal region. Much more regional vasogenic edema. Relatively stable appearance of the abnormal enhancement extending from the lateral margin of the frontal horn of left lateral ventricle to the frontal brain surface. New region of abnormal enhancement surrounding the frontal horn of the left lateral ventricle measuring approximately 3.2 cm. The differential diagnosis is recurrent tumor versus radiation necrosis. The patient is reportedly not an MRI candidate. This area is large enough that CT perfusion could possibly make a reasonable differentiation.   09/02/2016 Imaging   CT Head 09/02/16 IMPRESSION: Somewhat mixed pattern of slight posterior progression of pleomorphic enhancement surrounding the LEFT lateral ventricle although no significant mass effect, and reduced vasogenic edema compared with the scan in April.   09/27/2016 - 11/02/2016 Chemotherapy   Start dabrafenib and trametinib 09/27/16, stopped due to severe fatigue     10/05/2016 Imaging   CT Head w & w/o contrast  IMPRESSION: 1. Decreased size of the enhancing area within the left frontal lobe treatment site. Surrounding edema has also decreased. The findings support continued evolution of radiation necrosis. 2.  No new enhancing lesions.   10/18/2016 PET scan   IMPRESSION: 1. New focal hypermetabolism in the proximal sigmoid colon with associated focal colonic wall thickening and mild pericolonic fat stranding at the site of a proximal sigmoid diverticulum, favored to represent mild acute sigmoid diverticulitis. Metastatic disease is considered less likely. Clinical correlation is necessary. Consider appropriate antibiotic therapy, as clinically warranted. Attention to this region recommended on future PET-CT follow-up. 2. Otherwise complete metabolic response with no evidence of hypermetabolic metastatic disease. Lingular pulmonary nodule is slightly decreased in size and demonstrates no significant metabolism. 3. No residual hypermetabolism in the right thyroid lobe nodule, which is stable in size. 4. Aortic Atherosclerosis (ICD10-I70.0). Stable 3.1 cm infrarenal abdominal aortic aneurysm. Aortic aneurysm NOS (ICD10-I71.9). Recommend followup by ultrasound in 3 years. This recommendation follows ACR consensus guidelines: White Paper of the ACR Incidental Findings Committee II on Vascular Findings. Joellyn Rued Radiol 2013; 89:169-450.   11/01/2016 - 11/04/2016 Hospital Admission   Patient admitted to the hospital due to hypercalcemia where he received IV fluids and pamidronate   12/13/2016 PET scan   PET 12/13/16 IMPRESSION: 1. No findings to suggest metastatic disease on today's examination. 2. Previously noted hypermetabolism in the sigmoid colon has resolved, presumably indicative of a focus of diverticulitis on the prior study. Today's study does demonstrate relatively diffuse hypermetabolism throughout the cecum, ascending colon and proximal transverse colon where there is some mild mural thickening, favored to reflect mild chronic inflammation related to underlying diverticular disease. No definite inflammatory changes on the CT portion  of the examination to strongly suggest an active  acute diverticulitis at this time. No discrete lesion to suggest neoplasm. 3. Previously noted 8 mm lingular nodule is stable in size and known remains non hypermetabolic. This is nonspecific but reassuring. Continued attention on future followup studies is recommended. 4. Aortic atherosclerosis, in addition to left main and 3 vessel coronary artery disease. Status post median sternotomy for CABG including LIMA to the LAD. In addition, there is mild fusiform aneurysmal dilatation of the infrarenal abdominal aorta (3.1 cm in diameter) as well as aneurysmal dilatation of the left common iliac   12/18/2016 - 12/31/2017 Chemotherapy   Restarted nivolamab 218m every 2 weeks, with tapering dose dexamethasone     04/18/2017 Imaging   PET Scan IMPRESSION: 1. No hypermetabolic lesion to suggest active malignancy. 2. Stable 8 mm lingular nodule is not currently hypermetabolic but merit surveillance. Infrarenal abdominal aortic aneurysm 3.1 cm in diameter. Recommend followup by [facility] 3 years. 3. Other imaging findings of potential clinical significance: Postoperative findings in the left frontal lobe. Right renal cyst. Aortic Atherosclerosis (ICD10-I70.0). Osteoarthritis. Lumbar scoliosis. Left knee effusion. Colonic diverticulosis.    10/10/2017 PET scan   IMPRESSION: 1. Small focus of FDG accumulation in the subcutaneous fat of the right gluteal region, nonspecific. Attention on follow-up suggested. 2. Otherwise stable exam without new or progressive hypermetabolic disease in the interval since prior PET-CT. 3. Stable 8 mm nodule in the lingula. No evidence for hypermetabolism on PET imaging. 4.  Aortic Atherosclerois (ICD10-170.0)    01/29/2018 PET scan   IMPRESSION: 1. Small focus of hypermetabolism in the lower left paratracheal neck, which appears to localize to the posterior left thyroid lobe, without discrete thyroid nodule on the CT images, decreased in metabolism since 10/10/2017 PET-CT.  This finding is nonspecific and may represent a hypermetabolic thyroid nodule. Consider thyroid ultrasound correlation. 2. No new hypermetabolic findings suspicious for metastatic disease. Previously described low level metabolism in the subcutaneous right gluteal region has resolved, compatible with resolved inflammatory focus. No evidence of recurrent hypermetabolism within the stable lingular pulmonary nodule. No evidence of recurrent osseous hypermetabolism. 3. Stable infrarenal 3.4 cm Abdominal Aortic Aneurysm (ICD10-I71.9). Recommend follow-up aortic ultrasound in 3 years. This recommendation follows ACR consensus guidelines: White Paper of the ACR Incidental Findings Committee II on Vascular Findings. J Am Coll Radiol 2013;; 00:349-179 4.  Aortic Atherosclerosis (ICD10-I70.0).   09/18/2018 PET scan   PET 09/18/18  IMPRESSION: 1. No findings of active malignancy. The known small right cerebellar mass is not readily apparent on PET-CT but was shown on today's CT head. 2. 3.2 cm infrarenal abdominal aortic aneurysm. Recommend followup by [facility] 3 years. This recommendation follows ACR consensus guidelines: White Paper of the ACR Incidental Findings Committee II on Vascular Findings. J Am Coll Radiol 2013; 10:789-794. Aortic aneurysm NOS (ICD10-I71.9). 3. Other imaging findings of potential clinical significance: Left frontal lobe encephalomalacia underlying the craniotomy site. Chronic paranasal sinusitis. Aortic Atherosclerosis (ICD10-I70.0). Coronary atherosclerosis. Mild cardiomegaly. Colonic diverticulosis. Dextroconvex lumbar scoliosis. Small left knee effusion with mild synovitis. Postoperative findings in the right foot.   09/18/2018 Imaging   CT head  IMPRESSION: 1. No evidence of new intracranial metastases or acute abnormality. 2. Unchanged 4 mm right cerebellar metastasis. 3. Stable post treatment changes in the left frontal lobe.   APML (acute promyelocytic  leukemia) in remission ([place]  01/08/2019 Initial Diagnosis   APML (acute promyelocytic leukemia) in remission ([facility]   04/14/2019 -  Chemotherapy   The  patient had palonosetron (ALOXI) injection 0.25 mg, 0.25 mg, Intravenous,  Once, 2 of 4 cycles Administration: 0.25 mg (04/15/2019), 0.25 mg (04/17/2019), 0.25 mg (04/21/2019), 0.25 mg (04/23/2019), 0.25 mg (04/25/2019), 0.25 mg (04/28/2019), 0.25 mg (04/30/2019), 0.25 mg (05/02/2019), 0.25 mg (05/05/2019), 0.25 mg (05/07/2019), 0.25 mg (05/09/2019), 0.25 mg (04/18/2019) arsenic trioxide (TRISENOX) 13.3 mg in sodium chloride 0.9 % 250 mL chemo Infusion, 0.15 mg/kg = 13.3 mg (100 % of original dose 0.15 mg/kg), Intravenous,  Once, 2 of 4 cycles Dose modification: 0.15 mg/kg/day (original dose 0.15 mg/kg, Cycle 1, Reason: Other (see comments)), 0.15 mg/kg (original dose 0.15 mg/kg, Cycle 1, Reason: Other (see comments), Comment: change to mg/kg) Administration: 13.3 mg (04/21/2019), 13.3 mg (04/22/2019), 13.3 mg (04/23/2019), 13.3 mg (04/24/2019), 13.3 mg (04/25/2019), 13.3 mg (04/28/2019), 13.3 mg (04/29/2019), 13.3 mg (04/30/2019), 13.3 mg (05/01/2019), 13.3 mg (05/02/2019), 13.3 mg (05/05/2019), 13.3 mg (05/06/2019), 13.3 mg (05/07/2019), 13.3 mg (05/08/2019), 13.3 mg (05/09/2019)  for chemotherapy treatment.      Interval History:  Haaris Metallo. presents today for follow up following recent MRI brain.  No further chemotherapy since January.  He feels considerable improvement since then with regards to overall physical and mental state.  He continues to have periods of confusion, bad days as prior. Otherwise no specific new or progressive neurologic deficits.  No seizures or headaches.     Medications: Current Outpatient Medications on File Prior to Visit  Medication Sig Dispense Refill  . acetaminophen (TYLENOL) 325 MG tablet Take 2 tablets (650 mg total) by mouth every 6 (six) hours as needed for mild pain (or Fever >/= 101).    Marland Kitchen aspirin EC 81 MG tablet Take 81 mg by  mouth every evening.     . Blood Glucose Monitoring Suppl (ONETOUCH VERIO) w/Device KIT 1 kit by Does not apply route as directed. E11.65 1 kit 0  . Cholecalciferol (VITAMIN D3) 25 MCG (1000 UT) CAPS Take 1 capsule (1,000 Units total) by mouth daily. 30 capsule 0  . glucose blood test strip E11.65 Use as instructed to check sugars three time daily and as needed 100 each 12  . HUMALOG KWIKPEN 100 UNIT/ML KwikPen     . Lancets (ONETOUCH ULTRASOFT) lancets E11.65 Use as instructed to check sugars three time daily and as needed 100 each 12  . levothyroxine (SYNTHROID) 137 MCG tablet TAKE 1 TABLET BY MOUTH ONCE DAILY BEFORE BREAKFAST (Patient taking differently: Take 137 mcg by mouth daily before breakfast. ) 30 tablet 11  . metoprolol succinate (TOPROL-XL) 50 MG 24 hr tablet TAKE 1 AND 1/2 TABLET (75 MG) BY MOUTH EVERY DAY WITH OR IMMEDIATELY FOLLOWING A MEAL *DO NOT CRUSH* 50 tablet 11  . polyethylene glycol (MIRALAX / GLYCOLAX) 17 g packet Take 17 g by mouth 2 (two) times daily. (Patient taking differently: Take 17 g by mouth daily. ) 14 each 0  . sertraline (ZOLOFT) 100 MG tablet Take 100 mg by mouth daily.    Marland Kitchen testosterone cypionate (DEPOTESTOSTERONE CYPIONATE) 200 MG/ML injection Inject 0.5 mLs (100 mg total) into the muscle every 14 (fourteen) days. 10 mL 0  . traZODone (DESYREL) 50 MG tablet Take 0.5-1 tablets (25-50 mg total) by mouth at bedtime. (Patient taking differently: Take 25 mg by mouth at bedtime as needed for sleep. ) 30 tablet 1  . Turmeric Curcumin 500 MG CAPS Take by mouth.    . colchicine 0.6 MG tablet Take 1 tablet (0.6 mg total) by mouth daily as needed (gout flare).  Take 2 tablets on the first day of flare (Patient not taking: Reported on 11/25/2019) 30 tablet 0  . furosemide (LASIX) 20 MG tablet Take 1 tablet (20 mg total) by mouth every other day. (Patient not taking: Reported on 11/25/2019) 30 tablet 3  . hydrocortisone cream 1 % Apply topically. (Patient not taking: Reported on  11/25/2019)    . insulin aspart (NOVOLOG FLEXPEN) 100 UNIT/ML FlexPen Use three times daily with meals as needed per sliding scale: 2 units for every 50 units over 150 (2u for 150-199, 4u for 200-249, 6u for 250-299, etc) (Patient not taking: Reported on 11/25/2019) 3 mL 1  . ketoconazole (NIZORAL) 2 % shampoo Apply topically. (Patient not taking: Reported on 11/25/2019)    . methylphenidate (RITALIN) 5 MG tablet Take 1 tablet (5 mg total) by mouth 2 (two) times daily with breakfast and lunch. 60 tablet 0  . mineral oil-hydrophilic petrolatum (AQUAPHOR) ointment Apply topically. (Patient not taking: Reported on 11/25/2019)    . ondansetron (ZOFRAN-ODT) 4 MG disintegrating tablet Take 1 tablet (4 mg total) by mouth every 8 (eight) hours as needed for nausea or vomiting. (Patient not taking: Reported on 11/25/2019) 60 tablet 2  . sertraline (ZOLOFT) 50 MG tablet Take 1 tablet (50 mg total) by mouth daily. Take with breakfast.  Initiate '50mg'$  dose after completing 5 days of '25mg'$  . (Patient not taking: Reported on 11/25/2019) 90 tablet 3  . sodium chloride (ALTAMIST SPRAY) 0.65 % nasal spray Place into the nose. (Patient not taking: Reported on 11/25/2019)     Current Facility-Administered Medications on File Prior to Visit  Medication Dose Route Frequency Provider Last Rate Last Admin  . 0.9 %  sodium chloride infusion  1,000 mL Intravenous Once [name], MD      . arsenic trioxide (TRISENOX) 13.3 mg in sodium chloride 0.9 % 250 mL chemo Infusion  0.15 mg/kg/day (Treatment Plan Recorded) Intravenous Daily [name], MD   Stopped at 04/21/19 1404  . arsenic trioxide (TRISENOX) 13.3 mg in sodium chloride 0.9 % 250 mL chemo Infusion  0.15 mg/kg/day (Treatment Plan Recorded) Intravenous Daily [name], MD 132 mL/hr at 05/09/19 1340 13.3 mg at 05/09/19 1340  . sodium chloride flush (NS) 0.9 % injection 10 mL  10 mL Intracatheter PRN [name], MD   10 mL at 05/09/19 1318     Allergies:  Allergies  Allergen Reactions  . Ezetimibe Other (See Comments)    Unknown reaction  . Simvastatin Other (See Comments)    Reaction:  Gave pt a fever  Fever - temp of 103.   In 2003   Past Medical History:  Past Medical History:  Diagnosis Date  . Anginal pain ([place])   . Arthritis    mostly hands  . Benign familial hematuria   . BPH (benign prostatic hypertrophy)   . Brain cancer ([place])    melanoma with brain met  . Coronary artery disease   . GERD (gastroesophageal reflux disease)   . High cholesterol   . Hypertension   . Hypothyroidism   . Intracerebral hemorrhage ([place])   . Melanoma ([place])   . Metastatic melanoma to lung ([place])   . Mood disorder ([place])   . Myocardial infarction ([place])   . Pacemaker    due to syncope, 3rd degree HB (upgrade to [facility]. Jude CRT-P 02/25/13 (Dr. Uvaldo Rising)  . Rectal bleed    due to NSAIDS  . Renal disorder   . Sepsis ([place]) 12/09/2018  .  Skin cancer    melanoma  . Sleep apnea    uses CPAP  . Stroke [facility])    Past Surgical History:  Past Surgical History:  Procedure Laterality Date  . APPLICATION OF CRANIAL NAVIGATION N/A 10/15/2015   Procedure: APPLICATION OF CRANIAL NAVIGATION;  Surgeon: Erline Levine, MD;  Location: [place] NEURO ORS;  Service: Neurosurgery;  Laterality: N/A;  . BIV PACEMAKER GENERATOR CHANGEOUT N/A 04/02/2019   Procedure: BIV PACEMAKER GENERATOR CHANGEOUT;  Surgeon: Evans Lance, MD;  Location: [place] CV LAB;  Service: Cardiovascular;  Laterality: N/A;  . CARDIAC CATHETERIZATION  2013  . CARDIAC SURGERY     bypass X 2  . COLONOSCOPY WITH PROPOFOL N/A 05/08/2016   diverticulosis, int hem, no f/u needed [name])  . CORONARY ARTERY BYPASS GRAFT  2013   LIMA-LAD, SVG-PDA 03/27/11 (Dr. Francee Gentile)  . CRANIOTOMY N/A 10/15/2015   Procedure: LEFT FRONTAL CRANIOTOMY TUMOR EXCISION with Curve;  Surgeon: Erline Levine, MD;  Location: [place] NEURO ORS;  Service: Neurosurgery;  Laterality: N/A;  CRANIOTOMY TUMOR EXCISION  . EYE  SURGERY    . FOOT SURGERY  08/2010  . JOINT REPLACEMENT Left    partial knee  . KNEE ARTHROSCOPY  12/2008  . LYMPH NODE BIOPSY    . PACEMAKER INSERTION    . TONSILLECTOMY     Social History:  Social History   Socioeconomic History  . Marital status: Married    Spouse name: Not on file  . Number of children: 2  . Years of education: Not on file  . Highest education level: Not on file  Occupational History  . Occupation: retired Pharmacist, community  Tobacco Use  . Smoking status: Former Smoker    Packs/day: 1.00    Years: 3.00    Pack years: 3.00    Quit date: 04/10/1957    Years since quitting: 62.6  . Smokeless tobacco: Never Used  Vaping Use  . Vaping Use: Never used  Substance and Sexual Activity  . Alcohol use: Yes    Alcohol/week: 14.0 standard drinks    Types: 10 Glasses of wine, 4 Cans of beer per week  . Drug use: No  . Sexual activity: Not Currently  Other Topics Concern  . Not on file  Social History Narrative   Lives with wife [name]) with dementia in [place] children - daughter [name] and son    Occ: retired Pharmacist, community practiced in [place]   Has living will   [name] is health care [place]   Requests DNR-- done 09/14/15, declines tube feeds - verified 03/2019   Social Determinants of Health   Financial Resource Strain:   . Difficulty of Paying Living Expenses:   Food Insecurity:   . Worried About Charity fundraiser in the Last Year:   . Arboriculturist in the Last Year:   Transportation Needs:   . Film/video editor (Medical):   Marland Kitchen Lack of Transportation (Non-Medical):   Physical Activity:   . Days of Exercise per Week:   . Minutes of Exercise per Session:   Stress:   . Feeling of Stress :   Social Connections:   . Frequency of Communication with Friends and Family:   . Frequency of Social Gatherings with Friends and Family:   . Attends Religious Services:   . Active Member of Clubs or Organizations:   . Attends Theatre manager Meetings:   Marland Kitchen Marital Status:   Intimate Partner Violence:   .  Fear of Current or Ex-Partner:   . Emotionally Abused:   Marland Kitchen Physically Abused:   . Sexually Abused:    Family History:  Family History  Problem Relation Age of Onset  . Cancer Mother 39       lung   . Hypertension Mother   . Hypertension Father   . Heart attack Father   . Heart disease Father   . Rheum arthritis Sister   . Cancer Maternal Grandmother     Review of Systems: Constitutional: Denies fevers, chills or abnormal weight loss Eyes: Denies blurriness of vision Ears, nose, mouth, throat, and face: Denies mucositis or sore throat Respiratory: Denies cough, dyspnea or wheezes Cardiovascular: Denies palpitation, chest discomfort or lower extremity swelling Gastrointestinal:  Denies nausea, constipation, diarrhea GU: Denies dysuria or incontinence Skin: Denies abnormal skin rashes Neurological: Per HPI Musculoskeletal: Denies joint pain, back or neck discomfort. No decrease in ROM Behavioral/Psych: Denies anxiety, disturbance in thought content, and mood instability   Physical Exam: Vitals:   11/25/19 0920  BP: 127/90  Pulse: 88  Resp: 18  Temp: 98.9 F (37.2 C)  SpO2: 98%    KPS: 70. General: Alert, cooperative, pleasant, in no acute distress Head: Craniotomy scar noted, dry and intact. EENT: No conjunctival injection or scleral icterus. Oral mucosa moist Lungs: Resp effort normal Cardiac: Regular rate and rhythm Abdomen: Soft, non-distended abdomen Skin: No rashes cyanosis or petechiae. Extremities: No clubbing or edema  Neurologic Exam: Mental Status: Awake, alert, attentive to examiner. Oriented to self and environment. Language is fluent with intact comprehension.  Age advanced psychomotor slowing.  Limited insight at times.  Cranial Nerves: Visual acuity is grossly normal. Visual fields are full. Extra-ocular movements intact. No ptosis. Face is symmetric, tongue  midline. Motor: Tone and bulk are normal. Power is impaired to 4+/5 right leg, flexors>extendors. Subtle atrophy appreciated in left proximal hip girdle musculature. Reflexes are symmetric, no pathologic reflexes present. Intact finger to nose bilaterally Sensory: Intact to light touch and temperature Gait: Mildly hemiparetic   Labs: I have reviewed the data as listed    Component Value Date/Time   NA 139 10/15/2019 1105   NA 140 03/26/2017 0928   K 4.5 10/15/2019 1105   K 4.7 03/26/2017 0928   CL 103 10/15/2019 1105   CO2 27 10/15/2019 1105   CO2 21 (L) 03/26/2017 0928   GLUCOSE 117 (H) 10/15/2019 1105   GLUCOSE 95 03/26/2017 0928   BUN 41 (H) 11/24/2019 0914   BUN 19.3 03/26/2017 0928   CREATININE 1.63 (H) 11/24/2019 0914   CREATININE 1.77 (H) 05/24/2018 1445   CREATININE 1.2 03/26/2017 0928   CALCIUM 9.4 10/15/2019 1105   CALCIUM 8.7 03/26/2017 0928   PROT 6.7 10/15/2019 1105   PROT 6.8 03/26/2017 0928   ALBUMIN 4.0 10/15/2019 1105   ALBUMIN 3.7 03/26/2017 0928   AST 20 10/15/2019 1105   AST 18 05/24/2018 1445   AST 22 03/26/2017 0928   ALT 15 10/15/2019 1105   ALT 13 05/24/2018 1445   ALT 15 03/26/2017 0928   ALKPHOS 45 10/15/2019 1105   ALKPHOS 58 03/26/2017 0928   BILITOT 0.6 10/15/2019 1105   BILITOT 0.6 05/24/2018 1445   BILITOT 0.58 03/26/2017 0928   GFRNONAA 39 (L) 11/24/2019 0914   GFRNONAA 36 (L) 05/24/2018 1445   GFRAA 45 (L) 11/24/2019 0914   GFRAA 42 (L) 05/24/2018 1445   Lab Results  Component Value Date   WBC 4.5 10/15/2019   NEUTROABS 3.4 10/15/2019  HGB 14.3 10/15/2019   HCT 41.8 10/15/2019   MCV 92.7 10/15/2019   PLT 161 10/15/2019   Imaging:  [place] Clinician Interpretation: I have personally reviewed the CNS images as listed.  My interpretation, in the context of the patient's clinical presentation, is stable disease  CT HEAD W & WO CONTRAST  Result Date: 11/24/2019 CLINICAL DATA:  Stage IV melanoma, follow-up. EXAM: CT HEAD WITHOUT  AND WITH CONTRAST TECHNIQUE: Contiguous axial images were obtained from the base of the skull through the vertex without and with intravenous contrast CONTRAST:  45m OMNIPAQUE IOHEXOL 300 MG/ML  SOLN COMPARISON:  05/14/2019 FINDINGS: Brain: Postoperative left frontal lobe with volume loss. Stable extent of white matter low-density which is considered gliosis given volume loss. Minimal nodular enhancement along the lateral aspect of the ballooned frontal horn of the lateral ventricle is stable and likely scarring. Still no enhancement at the previously positive right cerebellar site. No new enhancement or masslike area. No interval infarct or collection. Vascular: Major vessels are enhancing. Skull: Unremarkable left-sided craniotomy. No evidence of aggressive bone lesion. Sinuses/Orbits: Scleral band on the left. Bilateral cataract resection. IMPRESSION: Stable from 05/14/2019.  No evidence of active disease. Electronically Signed   By: JMonte FantasiaM.D.   On: 11/24/2019 11:16    Assessment/Plan 1. Brain metastases ([place]  2. Cognitive Impairment  Mr. HSalzwedelis clinically and radiographically stable today.  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    No further workup or medications today.  Doing well with SSRI sertraline for mood.  We ask that CMorton Stall return to clinic in 12 months following next CT head, or sooner as needed.  He will continue to follow with Dr. FBurr Medicoand Dr. FGrayland Ormondlocally for systemic therapy.  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  We appreciate the opportunity to participate in the care of CNadir Vasques.   All questions were answered. The patient knows to call the clinic with any problems, questions or concerns. No barriers to learning were detected.  The total time spent in the encounter was 30 minutes minutes and more than 50% was on counseling and review of test  results   ZVentura Sellers MD Medical Director of Neuro-Oncology [facility] LPat WL[place]08/17/21 5:05 PM

## 2019-11-26 ENCOUNTER — Telehealth: Payer: Self-pay | Admitting: Internal Medicine

## 2019-11-26 ENCOUNTER — Encounter
Admission: RE | Admit: 2019-11-26 | Discharge: 2019-11-26 | Disposition: A | Discharge status: Home / Self Care | Payer: Medicare Other | Source: Ambulatory Visit | Attending: Oncology | Admitting: Oncology

## 2019-11-26 DIAGNOSIS — C439 Malignant melanoma of skin, unspecified: Secondary | ICD-10-CM | POA: Insufficient documentation

## 2019-11-26 DIAGNOSIS — C9241 Acute promyelocytic leukemia, in remission: Secondary | ICD-10-CM | POA: Diagnosis not present

## 2019-11-26 DIAGNOSIS — I714 Abdominal aortic aneurysm, without rupture: Secondary | ICD-10-CM | POA: Insufficient documentation

## 2019-11-26 DIAGNOSIS — I700 Atherosclerosis of aorta: Secondary | ICD-10-CM | POA: Insufficient documentation

## 2019-11-26 DIAGNOSIS — E041 Nontoxic single thyroid nodule: Secondary | ICD-10-CM | POA: Insufficient documentation

## 2019-11-26 LAB — GLUCOSE, CAPILLARY: Glucose-Capillary: 73 mg/dL (ref 70–99)

## 2019-11-26 MED ORDER — FLUDEOXYGLUCOSE F - 18 (FDG) INJECTION
10.7900 | Freq: Once | INTRAVENOUS | Status: AC | PRN
  Administered 2019-11-26: 10.79 via INTRAVENOUS

## 2019-11-26 NOTE — Telephone Encounter (Signed)
Scheduled appointment per 8/17 los. Left message for patient with appointment date and time.

## 2019-11-27 ENCOUNTER — Encounter: Payer: Self-pay | Admitting: Oncology

## 2019-11-27 ENCOUNTER — Other Ambulatory Visit: Payer: Medicare Other

## 2019-11-27 ENCOUNTER — Inpatient Hospital Stay: Payer: Medicare Other | Attending: Hospice and Palliative Medicine | Admitting: Hospice and Palliative Medicine

## 2019-11-27 ENCOUNTER — Other Ambulatory Visit: Payer: Self-pay | Admitting: Oncology

## 2019-11-27 DIAGNOSIS — C9590 Leukemia, unspecified not having achieved remission: Secondary | ICD-10-CM | POA: Diagnosis not present

## 2019-11-27 DIAGNOSIS — Z515 Encounter for palliative care: Secondary | ICD-10-CM | POA: Diagnosis not present

## 2019-11-27 DIAGNOSIS — E0865 Diabetes mellitus due to underlying condition with hyperglycemia: Secondary | ICD-10-CM

## 2019-11-27 DIAGNOSIS — C9241 Acute promyelocytic leukemia, in remission: Secondary | ICD-10-CM

## 2019-11-27 DIAGNOSIS — R262 Difficulty in walking, not elsewhere classified: Secondary | ICD-10-CM | POA: Diagnosis not present

## 2019-11-27 DIAGNOSIS — C9240 Acute promyelocytic leukemia, not having achieved remission: Secondary | ICD-10-CM | POA: Insufficient documentation

## 2019-11-27 NOTE — Progress Notes (Signed)
Virtual Visit via Telephone Note  I connected with David [name]. on 11/27/19 at 10:30 AM EDT by telephone and verified that I am speaking with the correct person using two identifiers.   I discussed the limitations, risks, security and privacy concerns of performing an evaluation and management service by telephone and the availability of in person appointments. I also discussed with the patient that there may be a patient responsible charge related to this service. The patient expressed understanding and agreed to proceed.   History of Present Illness: David [name]. is a 79 y.o. 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  Patient was receiving arsenic for his APML but this was discontinued due to poor tolerance.  Patient was referred to palliative care to help address goals and manage ongoing symptoms.   Observations/Objective: Spoke with patient by phone.  He reports that he is "doing better than I ever have."  He says that he is functionally improved and able to ambulate well.  He denies any symptomatic complaints.  He reports good oral intake.  He had a PET scan yesterday that does not appear to show any active metastatic disease.  Patient has follow-up visit tomorrow with Dr. Grayland [name].  Assessment and Plan: APML -on best supportive care.  Followed by home-based palliative care.  Follow-up visit tomorrow with Dr. Grayland [name]  Follow Up Instructions: RTC on 8/20 to see Dr. Grayland [name], follow up telephone/virtual visit with me in 2-3 months   I discussed the assessment and treatment plan with the patient. The patient was provided an opportunity to ask questions and all were answered. The patient agreed with the plan and demonstrated an understanding of the  instructions.   The patient was advised to call back or seek an in-person evaluation if the symptoms worsen or if the condition fails to improve as anticipated.  I provided 5 minutes of non-face-to-face time during this encounter.   Irean Hong, NP

## 2019-11-28 ENCOUNTER — Inpatient Hospital Stay: Payer: Medicare Other

## 2019-11-28 ENCOUNTER — Inpatient Hospital Stay (HOSPITAL_BASED_OUTPATIENT_CLINIC_OR_DEPARTMENT_OTHER): Payer: Medicare Other | Admitting: Oncology

## 2019-11-28 ENCOUNTER — Other Ambulatory Visit: Payer: Self-pay | Admitting: *Deleted

## 2019-11-28 ENCOUNTER — Other Ambulatory Visit: Payer: Self-pay

## 2019-11-28 ENCOUNTER — Other Ambulatory Visit: Payer: Self-pay | Admitting: Oncology

## 2019-11-28 VITALS — BP 133/96 | HR 75 | Temp 97.1°F | Resp 18 | Wt 201.9 lb

## 2019-11-28 DIAGNOSIS — C9241 Acute promyelocytic leukemia, in remission: Secondary | ICD-10-CM

## 2019-11-28 DIAGNOSIS — I255 Ischemic cardiomyopathy: Secondary | ICD-10-CM

## 2019-11-28 DIAGNOSIS — C9590 Leukemia, unspecified not having achieved remission: Secondary | ICD-10-CM | POA: Diagnosis not present

## 2019-11-28 DIAGNOSIS — R262 Difficulty in walking, not elsewhere classified: Secondary | ICD-10-CM | POA: Diagnosis not present

## 2019-11-28 DIAGNOSIS — C9240 Acute promyelocytic leukemia, not having achieved remission: Secondary | ICD-10-CM | POA: Diagnosis not present

## 2019-11-28 LAB — COMPREHENSIVE METABOLIC PANEL
ALT: 15 U/L (ref 0–44)
AST: 19 U/L (ref 15–41)
Albumin: 3.8 g/dL (ref 3.5–5.0)
Alkaline Phosphatase: 50 U/L (ref 38–126)
Anion gap: 7 (ref 5–15)
BUN: 36 mg/dL — ABNORMAL HIGH (ref 8–23)
CO2: 26 mmol/L (ref 22–32)
Calcium: 9.1 mg/dL (ref 8.9–10.3)
Chloride: 103 mmol/L (ref 98–111)
Creatinine, Ser: 1.72 mg/dL — ABNORMAL HIGH (ref 0.61–1.24)
GFR calc Af Amer: 43 mL/min — ABNORMAL LOW (ref >60)
GFR calc non Af Amer: 37 mL/min — ABNORMAL LOW (ref >60)
Glucose, Bld: 85 mg/dL (ref 70–99)
Potassium: 4.9 mmol/L (ref 3.5–5.1)
Sodium: 136 mmol/L (ref 135–145)
Total Bilirubin: 0.6 mg/dL (ref 0.3–1.2)
Total Protein: 6.7 g/dL (ref 6.5–8.1)

## 2019-11-28 LAB — CBC WITH DIFFERENTIAL/PLATELET
Abs Immature Granulocytes: 0.02 10*3/uL (ref 0.00–0.07)
Basophils Absolute: 0 10*3/uL (ref 0.0–0.1)
Basophils Relative: 1 %
Eosinophils Absolute: 0.2 10*3/uL (ref 0.0–0.5)
Eosinophils Relative: 3 %
HCT: 43.7 % (ref 39.0–52.0)
Hemoglobin: 15.1 g/dL (ref 13.0–17.0)
Immature Granulocytes: 0 %
Lymphocytes Relative: 9 %
Lymphs Abs: 0.5 10*3/uL — ABNORMAL LOW (ref 0.7–4.0)
MCH: 32.2 pg (ref 26.0–34.0)
MCHC: 34.6 g/dL (ref 30.0–36.0)
MCV: 93.2 fL (ref 80.0–100.0)
Monocytes Absolute: 0.5 10*3/uL (ref 0.1–1.0)
Monocytes Relative: 10 %
Neutro Abs: 3.9 10*3/uL (ref 1.7–7.7)
Neutrophils Relative %: 77 %
Platelets: 152 10*3/uL (ref 150–400)
RBC: 4.69 MIL/uL (ref 4.22–5.81)
RDW: 14.4 % (ref 11.5–15.5)
WBC: 5 10*3/uL (ref 4.0–10.5)
nRBC: 0 % (ref 0.0–0.2)

## 2019-11-28 MED ORDER — HEPARIN SOD (PORK) LOCK FLUSH 100 UNIT/ML IV SOLN
500.0000 [IU] | Freq: Once | INTRAVENOUS | Status: AC
  Administered 2019-11-28: 500 [IU] via INTRAVENOUS
  Filled 2019-11-28: qty 5

## 2019-11-28 MED ORDER — SODIUM CHLORIDE 0.9% FLUSH
10.0000 mL | Freq: Once | INTRAVENOUS | Status: AC
  Administered 2019-11-28: 10 mL via INTRAVENOUS
  Filled 2019-11-28: qty 10

## 2019-11-28 NOTE — Patient Outreach (Signed)
Member screened for potential [facility] Care Management needs as a benefit of [place] [employer].  Per Patient David [name], David [name] resides at [facility]).   Communication was sent to Admissions Coordinator to inquire about anticipated transition plans and potential [facility] Care Management needs.   Will continue to follow for potential [facility] services while member resides in SNF.  Marthenia Rolling, MSN-Ed, RN,BSN [place] Acute Care Coordinator [phone] [facility]) [phone]  (Toll free office)

## 2019-11-28 NOTE — Patient Outreach (Signed)
[facility] Post- Acute Care Coordinator follow up.   Clarification update from Admission Coordinator with [facility].  Mr. Bastin never admitted to SNF. He currently resides in [facility] where he receives outpatient therapy.   No identifiable [facility] Care Management services at this time.    Marthenia Rolling, MSN-Ed, RN,BSN [place] Acute Care Coordinator [phone] [facility]) [phone]  (Toll free office)

## 2019-12-01 DIAGNOSIS — R262 Difficulty in walking, not elsewhere classified: Secondary | ICD-10-CM | POA: Diagnosis not present

## 2019-12-01 DIAGNOSIS — C9241 Acute promyelocytic leukemia, in remission: Secondary | ICD-10-CM | POA: Diagnosis not present

## 2019-12-01 DIAGNOSIS — C9590 Leukemia, unspecified not having achieved remission: Secondary | ICD-10-CM | POA: Diagnosis not present

## 2019-12-01 LAB — MISC LABCORP TEST (SEND OUT): Labcorp test code: 511834

## 2019-12-02 ENCOUNTER — Telehealth: Payer: Self-pay | Admitting: Adult Health Nurse Practitioner

## 2019-12-02 NOTE — Telephone Encounter (Signed)
Called to schedule appointment.  Left VM with reason for call and call back info Izabellah Dadisman K. Olena Heckle NP

## 2019-12-04 LAB — PML/RAR TRANSLOCATION BY PCR

## 2019-12-05 ENCOUNTER — Other Ambulatory Visit: Payer: Self-pay | Admitting: Internal Medicine

## 2019-12-05 DIAGNOSIS — C9241 Acute promyelocytic leukemia, in remission: Secondary | ICD-10-CM | POA: Diagnosis not present

## 2019-12-05 DIAGNOSIS — R262 Difficulty in walking, not elsewhere classified: Secondary | ICD-10-CM | POA: Diagnosis not present

## 2019-12-05 DIAGNOSIS — C9590 Leukemia, unspecified not having achieved remission: Secondary | ICD-10-CM | POA: Diagnosis not present

## 2019-12-05 MED ORDER — METHYLPHENIDATE HCL 5 MG PO TABS: 5.0000 mg | ORAL_TABLET | Freq: Two times a day (BID) | ORAL | 0 refills | Status: DC

## 2019-12-08 ENCOUNTER — Ambulatory Visit: Payer: Self-pay | Attending: Internal Medicine

## 2019-12-08 DIAGNOSIS — Z23 Encounter for immunization: Secondary | ICD-10-CM

## 2019-12-08 NOTE — Progress Notes (Signed)
   [id number]  Name:  David [name].    MRN: [id number] DOB: November 02, 1940  12/08/2019  [name] was observed post Covid-19 immunization for 15 minutes without incident. He was provided with Vaccine Information Sheet and instruction to access the V-Safe system.   [name] was instructed to call 911 with any severe reactions post vaccine: Marland Kitchen Difficulty breathing  . Swelling of face and throat  . A fast heartbeat  . A bad rash all over body  . Dizziness and weakness

## 2019-12-09 DIAGNOSIS — C9590 Leukemia, unspecified not having achieved remission: Secondary | ICD-10-CM | POA: Diagnosis not present

## 2019-12-09 DIAGNOSIS — C9241 Acute promyelocytic leukemia, in remission: Secondary | ICD-10-CM | POA: Diagnosis not present

## 2019-12-09 DIAGNOSIS — R262 Difficulty in walking, not elsewhere classified: Secondary | ICD-10-CM | POA: Diagnosis not present

## 2019-12-10 DIAGNOSIS — C9241 Acute promyelocytic leukemia, in remission: Secondary | ICD-10-CM | POA: Diagnosis not present

## 2019-12-10 DIAGNOSIS — C9590 Leukemia, unspecified not having achieved remission: Secondary | ICD-10-CM | POA: Diagnosis not present

## 2019-12-10 DIAGNOSIS — F5105 Insomnia due to other mental disorder: Secondary | ICD-10-CM | POA: Diagnosis not present

## 2019-12-10 DIAGNOSIS — R262 Difficulty in walking, not elsewhere classified: Secondary | ICD-10-CM | POA: Diagnosis not present

## 2019-12-10 DIAGNOSIS — F332 Major depressive disorder, recurrent severe without psychotic features: Secondary | ICD-10-CM | POA: Diagnosis not present

## 2019-12-11 ENCOUNTER — Other Ambulatory Visit: Payer: Self-pay | Admitting: *Deleted

## 2019-12-11 DIAGNOSIS — C9241 Acute promyelocytic leukemia, in remission: Secondary | ICD-10-CM

## 2019-12-15 DIAGNOSIS — C9241 Acute promyelocytic leukemia, in remission: Secondary | ICD-10-CM | POA: Diagnosis not present

## 2019-12-15 DIAGNOSIS — R262 Difficulty in walking, not elsewhere classified: Secondary | ICD-10-CM | POA: Diagnosis not present

## 2019-12-15 DIAGNOSIS — C9590 Leukemia, unspecified not having achieved remission: Secondary | ICD-10-CM | POA: Diagnosis not present

## 2019-12-19 DIAGNOSIS — C9241 Acute promyelocytic leukemia, in remission: Secondary | ICD-10-CM | POA: Diagnosis not present

## 2019-12-19 DIAGNOSIS — R262 Difficulty in walking, not elsewhere classified: Secondary | ICD-10-CM | POA: Diagnosis not present

## 2019-12-19 DIAGNOSIS — C9590 Leukemia, unspecified not having achieved remission: Secondary | ICD-10-CM | POA: Diagnosis not present

## 2019-12-22 DIAGNOSIS — R262 Difficulty in walking, not elsewhere classified: Secondary | ICD-10-CM | POA: Diagnosis not present

## 2019-12-22 DIAGNOSIS — C9241 Acute promyelocytic leukemia, in remission: Secondary | ICD-10-CM | POA: Diagnosis not present

## 2019-12-22 DIAGNOSIS — C9590 Leukemia, unspecified not having achieved remission: Secondary | ICD-10-CM | POA: Diagnosis not present

## 2019-12-24 DIAGNOSIS — C9590 Leukemia, unspecified not having achieved remission: Secondary | ICD-10-CM | POA: Diagnosis not present

## 2019-12-24 DIAGNOSIS — C9241 Acute promyelocytic leukemia, in remission: Secondary | ICD-10-CM | POA: Diagnosis not present

## 2019-12-24 DIAGNOSIS — R262 Difficulty in walking, not elsewhere classified: Secondary | ICD-10-CM | POA: Diagnosis not present

## 2019-12-25 ENCOUNTER — Other Ambulatory Visit: Payer: Self-pay | Admitting: Radiology

## 2019-12-26 ENCOUNTER — Ambulatory Visit: Admission: RE | Admit: 2019-12-26 | Payer: Medicare Other | Source: Ambulatory Visit

## 2019-12-26 DIAGNOSIS — C9590 Leukemia, unspecified not having achieved remission: Secondary | ICD-10-CM | POA: Diagnosis not present

## 2019-12-26 DIAGNOSIS — C9241 Acute promyelocytic leukemia, in remission: Secondary | ICD-10-CM | POA: Diagnosis not present

## 2019-12-26 DIAGNOSIS — R262 Difficulty in walking, not elsewhere classified: Secondary | ICD-10-CM | POA: Diagnosis not present

## 2019-12-29 DIAGNOSIS — C9241 Acute promyelocytic leukemia, in remission: Secondary | ICD-10-CM | POA: Diagnosis not present

## 2019-12-29 DIAGNOSIS — C9590 Leukemia, unspecified not having achieved remission: Secondary | ICD-10-CM | POA: Diagnosis not present

## 2019-12-29 DIAGNOSIS — R262 Difficulty in walking, not elsewhere classified: Secondary | ICD-10-CM | POA: Diagnosis not present

## 2019-12-30 ENCOUNTER — Ambulatory Visit (INDEPENDENT_AMBULATORY_CARE_PROVIDER_SITE_OTHER): Payer: Medicare Other | Admitting: *Deleted

## 2019-12-30 DIAGNOSIS — I255 Ischemic cardiomyopathy: Secondary | ICD-10-CM

## 2019-12-30 LAB — CUP PACEART REMOTE DEVICE CHECK
Battery Remaining Longevity: 71 mo
Battery Remaining Percentage: 93 %
Battery Voltage: 2.96 V
Brady Statistic AP VP Percent: 99 %
Brady Statistic AP VS Percent: 1 %
Brady Statistic AS VP Percent: 1 %
Brady Statistic AS VS Percent: 1 %
Brady Statistic RA Percent Paced: 99 %
Date Time Interrogation Session: 20210921020010
Implantable Lead Implant Date: 19980107
Implantable Lead Implant Date: 19980107
Implantable Lead Implant Date: 20141118
Implantable Lead Location: 753858
Implantable Lead Location: 753859
Implantable Lead Location: 753860
Implantable Pulse Generator Implant Date: 20201223
Lead Channel Impedance Value: 1050 Ohm
Lead Channel Impedance Value: 360 Ohm
Lead Channel Impedance Value: 440 Ohm
Lead Channel Pacing Threshold Amplitude: 0.625 V
Lead Channel Pacing Threshold Amplitude: 0.875 V
Lead Channel Pacing Threshold Amplitude: 1.125 V
Lead Channel Pacing Threshold Pulse Width: 0.5 ms
Lead Channel Pacing Threshold Pulse Width: 0.5 ms
Lead Channel Pacing Threshold Pulse Width: 1 ms
Lead Channel Sensing Intrinsic Amplitude: 2.6 mV
Lead Channel Setting Pacing Amplitude: 1.875
Lead Channel Setting Pacing Amplitude: 2 V
Lead Channel Setting Pacing Amplitude: 2.125
Lead Channel Setting Pacing Pulse Width: 0.5 ms
Lead Channel Setting Pacing Pulse Width: 1 ms
Lead Channel Setting Sensing Sensitivity: 4 mV
Pulse Gen Model: 3562
Pulse Gen Serial Number: 6033885

## 2019-12-31 DIAGNOSIS — C9590 Leukemia, unspecified not having achieved remission: Secondary | ICD-10-CM | POA: Diagnosis not present

## 2019-12-31 DIAGNOSIS — C9241 Acute promyelocytic leukemia, in remission: Secondary | ICD-10-CM | POA: Diagnosis not present

## 2019-12-31 DIAGNOSIS — R262 Difficulty in walking, not elsewhere classified: Secondary | ICD-10-CM | POA: Diagnosis not present

## 2020-01-02 ENCOUNTER — Other Ambulatory Visit: Payer: Self-pay | Admitting: Family Medicine

## 2020-01-02 DIAGNOSIS — C9590 Leukemia, unspecified not having achieved remission: Secondary | ICD-10-CM | POA: Diagnosis not present

## 2020-01-02 DIAGNOSIS — C9241 Acute promyelocytic leukemia, in remission: Secondary | ICD-10-CM | POA: Diagnosis not present

## 2020-01-02 DIAGNOSIS — R262 Difficulty in walking, not elsewhere classified: Secondary | ICD-10-CM | POA: Diagnosis not present

## 2020-01-02 NOTE — Progress Notes (Signed)
Remote pacemaker transmission.   

## 2020-01-05 DIAGNOSIS — C9241 Acute promyelocytic leukemia, in remission: Secondary | ICD-10-CM | POA: Diagnosis not present

## 2020-01-05 DIAGNOSIS — C9590 Leukemia, unspecified not having achieved remission: Secondary | ICD-10-CM | POA: Diagnosis not present

## 2020-01-05 DIAGNOSIS — R262 Difficulty in walking, not elsewhere classified: Secondary | ICD-10-CM | POA: Diagnosis not present

## 2020-01-05 NOTE — Telephone Encounter (Signed)
ERx 

## 2020-01-13 ENCOUNTER — Ambulatory Visit (INDEPENDENT_AMBULATORY_CARE_PROVIDER_SITE_OTHER): Payer: Medicare Other | Admitting: Family Medicine

## 2020-01-13 ENCOUNTER — Encounter: Payer: Self-pay | Admitting: Family Medicine

## 2020-01-13 ENCOUNTER — Other Ambulatory Visit: Payer: Self-pay

## 2020-01-13 VITALS — BP 118/80 | HR 75 | Temp 97.3°F | Wt 201.0 lb

## 2020-01-13 DIAGNOSIS — R42 Dizziness and giddiness: Secondary | ICD-10-CM | POA: Insufficient documentation

## 2020-01-13 DIAGNOSIS — Z23 Encounter for immunization: Secondary | ICD-10-CM | POA: Diagnosis not present

## 2020-01-13 MED ORDER — MECLIZINE HCL 12.5 MG PO TABS: 12.5000 mg | ORAL_TABLET | Freq: Three times a day (TID) | ORAL | 0 refills | Status: AC | PRN

## 2020-01-13 NOTE — Assessment & Plan Note (Signed)
1 week of vertigo symptoms with ambulation. Denies positional symptoms and endorses room spinning. Etiology of vertigo unclear and given hx of stroke and several prior cancers will refer to ENT for further evaluation and diagnosis. Trial of meclizine - discussed sedation risk - while awaiting ENT. Neuro exam normal and no nystagmus noted.

## 2020-01-13 NOTE — Progress Notes (Signed)
Subjective:     David [name]. is a 79 y.o. male presenting for Dizziness (x 6 days )     HPI   #Dizziness - happens when he is walking - room spinning - denies lightheadedness - no palpitations - denies recent URI symptoms - no hx of dizziness in the past - endorses tinnitus in the ears for "a while" and was taking zoloft for his nerves and had this increased to 75 mg and was getting it more   Hx of cancer - melanoma and leukemia - but no active treatment    Review of Systems   Social History   Tobacco Use  Smoking Status Former Smoker  . Packs/day: 1.00  . Years: 3.00  . Pack years: 3.00  . Quit date: 04/10/1957  . Years since quitting: 62.8  Smokeless Tobacco Never Used        Objective:    BP Readings from Last 3 Encounters:  01/13/20 118/80  11/28/19 (!) 133/96  11/25/19 127/90   Wt Readings from Last 3 Encounters:  01/13/20 201 lb (91.2 kg)  11/28/19 201 lb 14.4 oz (91.6 kg)  11/25/19 199 lb 9.6 oz (90.5 kg)    BP 118/80   Pulse 75   Temp (!) 97.3 F (36.3 C) (Temporal)   Wt 201 lb (91.2 kg)   SpO2 96%   BMI 29.68 kg/m    Physical Exam Constitutional:      Appearance: Normal appearance. He is not ill-appearing or diaphoretic.  HENT:     Right Ear: External ear normal.     Left Ear: External ear normal.  Eyes:     General: No scleral icterus.    Extraocular Movements: Extraocular movements intact.     Right eye: Normal extraocular motion and no nystagmus.     Left eye: Normal extraocular motion and no nystagmus.     Conjunctiva/sclera: Conjunctivae normal.  Cardiovascular:     Rate and Rhythm: Normal rate and regular rhythm.     Heart sounds: No murmur heard.   Pulmonary:     Effort: Pulmonary effort is normal. No respiratory distress.     Breath sounds: Normal breath sounds. No wheezing.  Musculoskeletal:     Cervical back: Neck supple.  Skin:    General: Skin is warm and dry.  Neurological:     Mental Status: He is  alert. Mental status is at baseline.     Cranial Nerves: Cranial nerves are intact.     Sensory: Sensation is intact.     Motor: Motor function is intact.  Psychiatric:        Mood and Affect: Mood normal.           Assessment & Plan:   Problem List Items Addressed This Visit      Other   Vertigo - Primary    1 week of vertigo symptoms with ambulation. Denies positional symptoms and endorses room spinning. Etiology of vertigo unclear and given hx of stroke and several prior cancers will refer to ENT for further evaluation and diagnosis. Trial of meclizine - discussed sedation risk - while awaiting ENT. Neuro exam normal and no nystagmus noted.       Relevant Medications   meclizine (ANTIVERT) 12.5 MG tablet   Other Relevant Orders   Ambulatory referral to ENT    Other Visit Diagnoses    Need for influenza vaccination       Relevant Orders   Flu Vaccine QUAD High Dose(Fluad) (Completed)  Return if symptoms worsen or fail to improve.  Lesleigh Noe, MD  This visit occurred during the SARS-CoV-2 public health emergency.  Safety protocols were in place, including screening questions prior to the visit, additional usage of staff PPE, and extensive cleaning of exam room while observing appropriate contact time as indicated for disinfecting solutions.

## 2020-01-13 NOTE — Patient Instructions (Signed)
Try Meclizine 12.5 mg   It may make you sleepy  Can try higher dose if it doesn't make you sleepy or it doesn't help with symptoms.   Ear nose and throat referral

## 2020-01-20 DIAGNOSIS — H6121 Impacted cerumen, right ear: Secondary | ICD-10-CM | POA: Diagnosis not present

## 2020-01-20 DIAGNOSIS — R42 Dizziness and giddiness: Secondary | ICD-10-CM | POA: Diagnosis not present

## 2020-01-20 DIAGNOSIS — H903 Sensorineural hearing loss, bilateral: Secondary | ICD-10-CM | POA: Diagnosis not present

## 2020-01-22 ENCOUNTER — Telehealth: Payer: Self-pay

## 2020-01-22 NOTE — Telephone Encounter (Signed)
Volunteer check in call made for palliative care: Doing well, still in remission

## 2020-01-29 ENCOUNTER — Inpatient Hospital Stay: Payer: Medicare Other | Attending: Hospice and Palliative Medicine | Admitting: Hospice and Palliative Medicine

## 2020-01-29 DIAGNOSIS — Z515 Encounter for palliative care: Secondary | ICD-10-CM

## 2020-01-29 NOTE — Progress Notes (Signed)
Was unable to reach patient for virtual visit.  Will reschedule.

## 2020-02-11 DIAGNOSIS — L821 Other seborrheic keratosis: Secondary | ICD-10-CM | POA: Diagnosis not present

## 2020-02-11 DIAGNOSIS — D1801 Hemangioma of skin and subcutaneous tissue: Secondary | ICD-10-CM | POA: Diagnosis not present

## 2020-02-11 DIAGNOSIS — Z85828 Personal history of other malignant neoplasm of skin: Secondary | ICD-10-CM | POA: Diagnosis not present

## 2020-02-11 DIAGNOSIS — L918 Other hypertrophic disorders of the skin: Secondary | ICD-10-CM | POA: Diagnosis not present

## 2020-02-11 DIAGNOSIS — C44212 Basal cell carcinoma of skin of right ear and external auricular canal: Secondary | ICD-10-CM | POA: Diagnosis not present

## 2020-02-11 DIAGNOSIS — Z85820 Personal history of malignant melanoma of skin: Secondary | ICD-10-CM | POA: Diagnosis not present

## 2020-02-11 DIAGNOSIS — L218 Other seborrheic dermatitis: Secondary | ICD-10-CM | POA: Diagnosis not present

## 2020-02-11 DIAGNOSIS — L570 Actinic keratosis: Secondary | ICD-10-CM | POA: Diagnosis not present

## 2020-02-11 DIAGNOSIS — D485 Neoplasm of uncertain behavior of skin: Secondary | ICD-10-CM | POA: Diagnosis not present

## 2020-02-12 ENCOUNTER — Other Ambulatory Visit: Payer: Self-pay | Admitting: *Deleted

## 2020-02-12 NOTE — Progress Notes (Signed)
Ritalin reordered via fax

## 2020-02-25 ENCOUNTER — Other Ambulatory Visit: Payer: Self-pay | Admitting: Oncology

## 2020-02-27 ENCOUNTER — Other Ambulatory Visit: Payer: Self-pay

## 2020-02-27 ENCOUNTER — Other Ambulatory Visit: Payer: Self-pay | Admitting: *Deleted

## 2020-02-27 ENCOUNTER — Encounter: Payer: Self-pay | Admitting: Physician Assistant

## 2020-02-27 ENCOUNTER — Telehealth: Payer: Self-pay | Admitting: Cardiovascular Disease

## 2020-02-27 ENCOUNTER — Ambulatory Visit (INDEPENDENT_AMBULATORY_CARE_PROVIDER_SITE_OTHER): Payer: Medicare Other | Admitting: Physician Assistant

## 2020-02-27 VITALS — BP 130/88 | HR 87 | Ht 69.0 in | Wt 204.0 lb

## 2020-02-27 DIAGNOSIS — Z951 Presence of aortocoronary bypass graft: Secondary | ICD-10-CM

## 2020-02-27 DIAGNOSIS — C9241 Acute promyelocytic leukemia, in remission: Secondary | ICD-10-CM

## 2020-02-27 DIAGNOSIS — I10 Essential (primary) hypertension: Secondary | ICD-10-CM

## 2020-02-27 DIAGNOSIS — E785 Hyperlipidemia, unspecified: Secondary | ICD-10-CM

## 2020-02-27 DIAGNOSIS — E782 Mixed hyperlipidemia: Secondary | ICD-10-CM | POA: Diagnosis not present

## 2020-02-27 DIAGNOSIS — E781 Pure hyperglyceridemia: Secondary | ICD-10-CM

## 2020-02-27 DIAGNOSIS — I255 Ischemic cardiomyopathy: Secondary | ICD-10-CM

## 2020-02-27 DIAGNOSIS — N183 Chronic kidney disease, stage 3 unspecified: Secondary | ICD-10-CM

## 2020-02-27 DIAGNOSIS — I2510 Atherosclerotic heart disease of native coronary artery without angina pectoris: Secondary | ICD-10-CM

## 2020-02-27 DIAGNOSIS — Z87898 Personal history of other specified conditions: Secondary | ICD-10-CM | POA: Diagnosis not present

## 2020-02-27 DIAGNOSIS — Z950 Presence of cardiac pacemaker: Secondary | ICD-10-CM

## 2020-02-27 MED ORDER — AMLODIPINE BESYLATE 2.5 MG PO TABS
2.5000 mg | ORAL_TABLET | Freq: Every day | ORAL | 2 refills | Status: AC
Start: 2020-02-27 — End: 2020-10-05

## 2020-02-27 MED ORDER — REPATHA SURECLICK 140 MG/ML ~~LOC~~ SOAJ: 1.0000 "pen " | SUBCUTANEOUS | 11 refills | Status: AC

## 2020-02-27 NOTE — Progress Notes (Signed)
Office Visit    Patient Name: David [name]. Date of Encounter: 02/27/2020  Primary Care Provider:  [name], MD Primary Cardiologist:  Ida Rogue, MD  Chief Complaint    Chief Complaint  Patient presents with  . Annual Exam    Discuss medication changes---pt wants to video in Dr. [name] for appointment (daughter).    79 year old male with history of CAD s/p CABG 2014, hypertension, hyperlipidemia, OSA on CPAP, stroke, history of ICH 2/2 hypertensive crisis 2017, CHB with PPM upgraded to [employer] Jude CRT-P, ICM with improvement in LVEF, DM2 hypothyroidism, CKD 3, former smoker, stage IV metastatic melanoma including lung and brain mets s/p surgical resection with neuro complications (including memory and aphasia), prostate cancer plan for surveillance given comorbidities, acute promyelocytic leukemia in remission with associated pancytopenia and followed at [facility], and seen today for follow-up.   Past Medical History    Past Medical History:  Diagnosis Date  . Anginal pain ([place])   . Arthritis    mostly hands  . Benign familial hematuria   . BPH (benign prostatic hypertrophy)   . Brain cancer ([place])    melanoma with brain met  . Coronary artery disease   . GERD (gastroesophageal reflux disease)   . High cholesterol   . Hypertension   . Hypothyroidism   . Intracerebral hemorrhage ([place])   . Melanoma ([place])   . Metastatic melanoma to lung ([place])   . Mood disorder ([place])   . Myocardial infarction ([place])   . Pacemaker    due to syncope, 3rd degree HB (upgrade to [facility]. Jude CRT-P 02/25/13 (Dr. Uvaldo Rising)  . Rectal bleed    due to NSAIDS  . Renal disorder   . Sepsis ([place]) 12/09/2018  . Skin cancer    melanoma  . Sleep apnea    uses CPAP  . Stroke [facility])    Past Surgical History:  Procedure Laterality Date  . APPLICATION OF CRANIAL NAVIGATION N/A 10/15/2015   Procedure: APPLICATION OF CRANIAL NAVIGATION;  Surgeon: Erline Levine, MD;  Location: [place] NEURO ORS;   Service: Neurosurgery;  Laterality: N/A;  . BIV PACEMAKER GENERATOR CHANGEOUT N/A 04/02/2019   Procedure: BIV PACEMAKER GENERATOR CHANGEOUT;  Surgeon: Evans Lance, MD;  Location: [place] CV LAB;  Service: Cardiovascular;  Laterality: N/A;  . CARDIAC CATHETERIZATION  2013  . CARDIAC SURGERY     bypass X 2  . COLONOSCOPY WITH PROPOFOL N/A 05/08/2016   diverticulosis, int hem, no f/u needed [name])  . CORONARY ARTERY BYPASS GRAFT  2013   LIMA-LAD, SVG-PDA 03/27/11 (Dr. Francee Gentile)  . CRANIOTOMY N/A 10/15/2015   Procedure: LEFT FRONTAL CRANIOTOMY TUMOR EXCISION with Curve;  Surgeon: Erline Levine, MD;  Location: [place] NEURO ORS;  Service: Neurosurgery;  Laterality: N/A;  CRANIOTOMY TUMOR EXCISION  . EYE SURGERY    . FOOT SURGERY  08/2010  . JOINT REPLACEMENT Left    partial knee  . KNEE ARTHROSCOPY  12/2008  . LYMPH NODE BIOPSY    . PACEMAKER INSERTION    . TONSILLECTOMY      Allergies  Allergies  Allergen Reactions  . Ezetimibe Other (See Comments)    Unknown reaction  . Simvastatin Other (See Comments)    Reaction:  Gave pt a fever  Fever - temp of 103.   In 2003    History of Present Illness    David [name]. is a 79 y.o. male with PMH as above.    He is DNR status.  He has a history of CAD s/p CABG 2014, hypertension, hyperlipidemia, OSA on CPAP, stroke, history of ICH 2/2 hypertensive crisis 2017, CHB with PPM, ICM with upgrade of PPM to CRT'-P, DM2, hypothyroidism, CKD 3, ICM with improvement in LVEF.  He has a prolonged cancer history including metastatic melanoma to brain and lung, prostate cancer, a PML.   He was hospitalized for a long time in 2020 at [facility] for treatment with all transretinoic acid (ATRA) plus arsenic trioxide (ATO) started on 10/15-16/2020.  Arsenic held a few days due to prolonged QTC.  ATRA was stopped temporarily due to rash.  He received 38 days total ATRA.  He did receive intermittent steroids for gout flares while hospitalized.  Colchicine  was discontinued due to concern over myelosuppression.  Planned prolonged prednisone taper.  He was treated with acyclovir PPx.  Amlodipine was held due to normotensive readings.  He continued ASA and metoprolol.  He was treated with sevelamer phosphate binder during hospitalization. Of note, further hospitalizations reported for UTI, foot cellulitis, and gout flares.    He was last seen by Dr. Rockey Situ 05/06/2018.  At that time, he denied any anginal symptoms.  It was noted cholesterol was above goal but trending downward.  He reported having problems in the past with Zetia but continued on Crestor 20 mg. He was eating less.  Recent echo showed EF 60 to 65%.    He continues to follow with Dr. Caryl Comes.  When last seen in office 07/08/2019, he reported some shortness of breath, DOE, and peripheral edema with albumin 3.5.  Today, he returns to clinic with his home health care worker and daughter, Dr. [name], via video chat.  He reports doing overall well from a cardiac standpoint today, though he felt dizzy the previous few weeks.  He states that he felt as if the world was spinning. He otherwise denies any significant sx other than his stable DOE with heavy exertion. Most of the history obtained via the daughter during today's exam.  She reports she is mainly interested in aggressive medical management and risk factor control, and she believes that since her father has overcome cancer as above, he will be around now for many years, and she would like to reduce his risk of CAD or major cardiac event 3 risk factor modification and appropriate lifestyle medication changes.  Home Medications    Current Outpatient Medications on File Prior to Visit  Medication Sig Dispense Refill  . acetaminophen (TYLENOL) 325 MG tablet Take 2 tablets (650 mg total) by mouth every 6 (six) hours as needed for mild pain (or Fever >/= 101).    Marland Kitchen allopurinol (ZYLOPRIM) 100 MG tablet Take 200 mg by mouth daily.    Marland Kitchen aspirin EC 81 MG  tablet Take 81 mg by mouth every evening.     . Cholecalciferol (VITAMIN D3) 25 MCG (1000 UT) CAPS Take 1 capsule (1,000 Units total) by mouth daily. 30 capsule 0  . ketoconazole (NIZORAL) 2 % shampoo Apply topically.     Marland Kitchen levothyroxine (SYNTHROID) 137 MCG tablet TAKE 1 TABLET BY MOUTH ONCE DAILY BEFORE BREAKFAST (Patient taking differently: Take 137 mcg by mouth daily before breakfast. ) 30 tablet 11  . meclizine (ANTIVERT) 12.5 MG tablet Take 1 tablet (12.5 mg total) by mouth 3 (three) times daily as needed for dizziness. 30 tablet 0  . methylphenidate (RITALIN) 5 MG tablet Take 1 tablet (5 mg total) by mouth 2 (two) times daily with breakfast and lunch. 60 tablet  0  . metoprolol succinate (TOPROL-XL) 50 MG 24 hr tablet TAKE 1 AND 1/2 TABLET (75 MG) BY MOUTH EVERY DAY WITH OR IMMEDIATELY FOLLOWING A MEAL *DO NOT CRUSH* 50 tablet 11  . sertraline (ZOLOFT) 50 MG tablet Take 1 tablet (50 mg total) by mouth daily. Take with breakfast.  Initiate 31m dose after completing 5 days of 239m. 90 tablet 3  . testosterone cypionate (DEPOTESTOSTERONE CYPIONATE) 200 MG/ML injection INJECT 0.5ML (100MG TOTAL) INTRAMUSCULARLY EVERY 14 DAYS (SINGLE DOSE VIAL) *ONLY GOOD FOR 1 DOSE* *CALL FOR REFILL* 2 mL 4  . traZODone (DESYREL) 50 MG tablet Take 0.5-1 tablets (25-50 mg total) by mouth at bedtime. (Patient taking differently: Take 25 mg by mouth at bedtime as needed for sleep. ) 30 tablet 1  . Turmeric Curcumin 500 MG CAPS Take by mouth.     Current Facility-Administered Medications on File Prior to Visit  Medication Dose Route Frequency Provider Last Rate Last Admin  . 0.9 %  sodium chloride infusion  1,000 mL Intravenous Once FeTruitt MerleMD        Review of Systems    He denies chest pain, palpitations, pnd, orthopnea, n, v, syncope, edema, weight gain, or early satiety.  He reports dizziness 1 to 2 weeks ago that has now resolved.  He reports DOE with very heavy exertion.   All other systems reviewed and are  otherwise negative except as noted above.  Physical Exam    VS:  BP 130/88   Pulse 87   Ht 5' 9"  (1.753 m)   Wt 204 lb (92.5 kg)   BMI 30.13 kg/m  , BMI Body mass index is 30.13 kg/m. GEN: Elderly male, in no acute distress.  Joined by his home health worker and daughter via viDietitianHEENT: normal. Neck: Supple, no JVD, carotid bruits, or masses. Cardiac: RRR.  1/6 systolic murmur RUSB.  No rubs, or gallops.  Device pocket well-healed.  No clubbing, cyanosis, edema.  Radials/DP/PT 2+ and equal bilaterally.  Respiratory:  Respirations regular and unlabored, bibasilar reduced breath sounds. GI: Soft, nontender, nondistended, BS + x 4. MS: no deformity or atrophy. Skin: warm and dry, no rash. Neuro:  Strength and sensation are intact. Psych: Normal affect.  Accessory Clinical Findings    ECG personally reviewed by me today - AV dual paced rhythm , 87 bpm, PR interval 140 ms, QTC 560 ms, QRS 164 ms- no acute changes.  VITALS Reviewed today   Temp Readings from Last 3 Encounters:  01/13/20 (!) 97.3 F (36.3 C) (Temporal)  11/28/19 (!) 97.1 F (36.2 C) (Tympanic)  11/25/19 98.9 F (37.2 C) (Tympanic)   BP Readings from Last 3 Encounters:  02/27/20 130/88  01/13/20 118/80  11/28/19 (!) 133/96   Pulse Readings from Last 3 Encounters:  02/27/20 87  01/13/20 75  11/28/19 75    Wt Readings from Last 3 Encounters:  02/27/20 204 lb (92.5 kg)  01/13/20 201 lb (91.2 kg)  11/28/19 201 lb 14.4 oz (91.6 kg)     LABS  reviewed today   Lab Results  Component Value Date   WBC 5.0 11/28/2019   HGB 15.1 11/28/2019   HCT 43.7 11/28/2019   MCV 93.2 11/28/2019   PLT 152 11/28/2019   Lab Results  Component Value Date   CREATININE 1.72 (H) 11/28/2019   BUN 36 (H) 11/28/2019   NA 136 11/28/2019   K 4.9 11/28/2019   CL 103 11/28/2019   CO2 26 11/28/2019  Lab Results  Component Value Date   ALT 15 11/28/2019   AST 19 11/28/2019   ALKPHOS 50 11/28/2019   BILITOT 0.6  11/28/2019   Lab Results  Component Value Date   CHOL 165 03/21/2018   HDL 40.70 03/21/2018   LDLCALC 135 (H) 08/24/2015   LDLDIRECT 81.0 03/21/2018   TRIG 352.0 (H) 03/21/2018   CHOLHDL 4 03/21/2018    Lab Results  Component Value Date   HGBA1C 5.6 10/15/2019   Lab Results  Component Value Date   TSH 1.750 10/15/2019    Care everywhere labs 05/2019 CBC: WBC 3.3, hemoglobin 12.0, hematocrit 36.3, platelets 192 CMP: Sodium 136, potassium 5.0, BUN 35, creatinine 2.0, AST 27, ALT 15, alk phos 71, albumin 3.0, calcium 9.4 Lipid panel: Total cholesterol 306, LDL direct 188, HDL 26, triglycerides 361 Magnesium 2.0,  STUDIES/PROCEDURES reviewed today   Echo 08/2019 1. Left ventricular ejection fraction, by estimation, is 55 to 60%. The  left ventricle has normal function. The left ventricle has no regional  wall motion abnormalities. Left ventricular diastolic function could not  be evaluated.  2. Right ventricular systolic function is normal. The right ventricular  size is not well visualized.  3. The mitral valve is normal in structure. No evidence of mitral valve  regurgitation.  4. The aortic valve is tricuspid. Aortic valve regurgitation is not  visualized. Mild to moderate aortic valve sclerosis/calcification is  present, without any evidence of aortic stenosis.   Assessment & Plan    Atherosclerosis of native coronary artery of native heart with stable angina pectoris History of CABG 2014 --No chest pain.  Most recent 08/2019 echo as above with EF 55 to 60% and no regional wall motion abnormalities.  Recommend continue medical management and risk factor modification.  No further ischemic work-up at this time. Ongoing activity recommended as tolerated.  Heart healthy low-sodium diet recommended.  Given elevated LDL and total cholesterol, we discussed PCSK9 inhibitors with agreement to proceed with Repatha as below. Continue ASA 81 mg daily, Toprol 50 mg daily, and newly  added Repatha.  ICM s/p biventricular ICD (CRT-P, [employer] Jude) --No shortness of breath at rest.  Dyspnea with only heavy activity, attributed to deconditioning.  He was dizzy a few weeks ago as outlined above, though this has not recurred, and with recommendations as directly below.  Euvolemic and well compensated on exam today.  Recent echo as above with normal EF and wall motion.  Continue to monitor with periodic echo.  Continue medical management.  Given elevated BP at times, added very low-dose amlodipine 2.5 mg to be used with strict hold parameters as outlined under hypertension at or below.  Continue to follow with Dr. Caryl Comes, EP.  History of Dizziness --He experienced dizziness the other week without clear triggers.  This has since resolved and not recurred since that time.  It is unclear if this dizziness was 2/2 dehydration. Check a CBC and BMET.  Recent TSH collected on review of labs and WNL.  Recent echo as above with EF normal.  No carotid bruits appreciated on exam.  No report of amaurosis fugax.  His cardiac device appears to be functioning normally on review of EMR.  He is euvolemic and well compensated on exam today.  Continue to follow with EP.  Further recommendations, if indicated, pending labs.  Hyperlipidemia, LDL goal below 70 Hypertriglyceridemia History of intolerance of specific statins and Zetia --LDL not well controlled.  As above and verbally transcribed under transcribed care  everywhere labs, most recent 05/2019 LDL direct 188 with total cholesterol 306 and triglycerides 361.  HDL low at 26.  He reports problems in the past with Zetia.  Crestor has been well-tolerated in the past; however, given the elevation of his LDL and total cholesterol, recommendation was instead to initiate Repatha.  If insurance denies this medication, recommend Praluent.  If triglycerides remain high at RTC, consider initiation of Vascepa if tolerated. Follow-up lipid and liver function in 6 to 8  weeks.  Essential hypertension --BP 130/88.  For additional BP support, recommended amlodipine 2.5 mg daily, as this has been well-tolerated in the past.  Strict hold guidelines provided for amlodipine, as he does have a history of low pressure in the past.  Provided with conservative hold guidelines at this time, recommending that they hold for normotensive BP or SBP 120 or lower until we establish how well this is tolerated by the patient.  Emphasized my desire to avoid any falls or LOC.  Also instructed that they should hold if patient is dizzy or dehydrated and be sure to make slow position changes and monitor closely with the start of this medication.    CKD 3 --Continue to monitor renal function and electrolytes.  We will check a BMET today to reassess renal function electrolytes.  Metastatic melanoma to lung and brain s/p surgical resection Promyelocytic leukemia, acute, remission --Per oncology.  Medication changes: Start Repatha Labs ordered: CBC, BMET.  Lipid and liver function in 6-day weeks. Studies / Imaging ordered: None. Future considerations:?  Vascepa, escalation of amlodipine if needed Disposition: RTC 6 months or sooner if needed    Arvil Chaco, PA-C 02/27/2020

## 2020-02-27 NOTE — Telephone Encounter (Signed)
Repatha requires prior auth before insurance will approve. Please make pharmacy aware when the David [name] is received

## 2020-02-27 NOTE — Patient Instructions (Signed)
Medication Instructions:  Your physician has recommended you make the following change in your medication:  1- START Amlodipine 2.5 mg (1 tablet) by mouth once a day.  Do not take if experiencing dizziness and systolic BP (Top number) is less than 120.  2- START Repatha 140 mg/ML - Inject 1 pen into skin every 14 days for your cholesterol management.  *If you need a refill on your cardiac medications before your next appointment, please call your pharmacy*  Lab Work: Your physician recommends that you return for lab work in: [place], BMET.  Your physician recommends that you return for lab work in: [place] April 15, 2020. - WILL CHECK LIPID AND LIVER PANEL. - Please go to the [facility]. You will check in at the front desk to the right as you walk into the atrium. Valet Parking is offered if needed. - No appointment needed. You may go any day between 7 am and 6 pm. - You will need to be fasting. Please do not have anything to eat or drink after midnight the morning you have the lab work. You may only have water or black coffee with no cream or sugar.  If you have labs (blood work) drawn today and your tests are completely normal, you will receive your results only by: Marland Kitchen MyChart Message (if you have MyChart) OR . A paper copy in the mail If you have any lab test that is abnormal or we need to change your treatment, we will call you to review the results.  Testing/Procedures: none  Follow-Up: At [facility], you and your health needs are our priority.  As part of our continuing mission to provide you with exceptional heart care, we have created designated Provider Care Teams.  These Care Teams include your primary Cardiologist (physician) and Advanced Practice Providers (APPs -  Physician Assistants and Nurse Practitioners) who all work together to provide you with the care you need, when you need it.  We recommend signing up for the patient portal called  "MyChart".  Sign up information is provided on this After Visit Summary.  MyChart is used to connect with patients for Virtual Visits (Telemedicine).  Patients are able to view lab/test results, encounter notes, upcoming appointments, etc.  Non-urgent messages can be sent to your provider as well.   To learn more about what you can do with MyChart, go to [email or web address].    Your next appointment:   6 month(s)  The format for your next appointment:   In Person  Provider:   You may see Ida Rogue, MD or one of the following Advanced Practice Providers on your designated Care Team:    Murray Hodgkins, NP  Christell Faith, PA-C  Marrianne Mood, PA-C  Cadence Garey, [place]  Laurann [place], NP

## 2020-02-28 ENCOUNTER — Encounter: Payer: Self-pay | Admitting: Physician Assistant

## 2020-02-28 LAB — BASIC METABOLIC PANEL
BUN/Creatinine Ratio: 21 (ref 10–24)
BUN: 33 mg/dL — ABNORMAL HIGH (ref 8–27)
CO2: 23 mmol/L (ref 20–29)
Calcium: 9.3 mg/dL (ref 8.6–10.2)
Chloride: 103 mmol/L (ref 96–106)
Creatinine, Ser: 1.6 mg/dL — ABNORMAL HIGH (ref 0.76–1.27)
GFR calc Af Amer: 46 mL/min/{1.73_m2} — ABNORMAL LOW (ref >59)
GFR calc non Af Amer: 40 mL/min/{1.73_m2} — ABNORMAL LOW (ref >59)
Glucose: 99 mg/dL (ref 65–99)
Potassium: 5 mmol/L (ref 3.5–5.2)
Sodium: 140 mmol/L (ref 134–144)

## 2020-02-28 LAB — CBC
Hematocrit: 42.9 % (ref 37.5–51.0)
Hemoglobin: 14.4 g/dL (ref 13.0–17.7)
MCH: 31.2 pg (ref 26.6–33.0)
MCHC: 33.6 g/dL (ref 31.5–35.7)
MCV: 93 fL (ref 79–97)
Platelets: 192 10*3/uL (ref 150–450)
RBC: 4.62 x10E6/uL (ref 4.14–5.80)
RDW: 14.2 % (ref 11.6–15.4)
WBC: 4.7 10*3/uL (ref 3.4–10.8)

## 2020-02-28 NOTE — Progress Notes (Signed)
[place]  [phone] [phone]  ID: David [name]. OB: 1941-01-22  MR#: [id number]  FXT#:[id number]  Patient Care Team: [name], MD as PCP - General (Family Medicine) Minna Merritts, MD as PCP - Cardiology (Cardiology) Deboraha Sprang, MD as PCP - Electrophysiology (Cardiology) Cira Rue, RN Nurse Navigator as Registered Nurse ([place]) Acquanetta Chain, DO as Consulting Physician [facility] and Palliative Medicine) [name], MD as Consulting Physician (Hematology and Oncology)  CHIEF COMPLAINT: Acute promyelocytic leukemia in remission  INTERVAL HISTORY: Patient returns to clinic today for repeat laboratory work and routine evaluation.  He continues have chronic weakness and fatigue, but otherwise feels well.  He has no neurologic complaints.  He has a good appetite and denies weight loss.  He denies any recent fevers or illnesses. He denies any chest pain, shortness of breath, cough, or hemoptysis.  He denies any nausea, vomiting, constipation, or diarrhea.  He has no urinary complaints.  Patient offers no further specific complaints today.  REVIEW OF SYSTEMS:   Review of Systems  Constitutional: Positive for malaise/fatigue. Negative for fever and weight loss.  Respiratory: Negative.  Negative for cough, hemoptysis and shortness of breath.   Cardiovascular: Negative.  Negative for chest pain and leg swelling.  Gastrointestinal: Negative.  Negative for abdominal pain and nausea.  Genitourinary: Negative.  Negative for hematuria.  Musculoskeletal: Negative.  Negative for back pain.  Skin: Negative.  Negative for rash.  Neurological: Positive for weakness. Negative for dizziness, focal weakness and headaches.  Psychiatric/Behavioral: Negative.  The patient is not nervous/anxious.     As per HPI. Otherwise, a complete review of systems is negative.  PAST MEDICAL HISTORY: Past Medical History:   Diagnosis Date  . Anginal pain ([place])   . Arthritis    mostly hands  . Benign familial hematuria   . BPH (benign prostatic hypertrophy)   . Brain cancer ([place])    melanoma with brain met  . Coronary artery disease   . GERD (gastroesophageal reflux disease)   . High cholesterol   . Hypertension   . Hypothyroidism   . Intracerebral hemorrhage ([place])   . Melanoma ([place])   . Metastatic melanoma to lung ([place])   . Mood disorder ([place])   . Myocardial infarction ([place])   . Pacemaker    due to syncope, 3rd degree HB (upgrade to [facility]. Jude CRT-P 02/25/13 (Dr. Uvaldo Rising)  . Rectal bleed    due to NSAIDS  . Renal disorder   . Sepsis ([place]) 12/09/2018  . Skin cancer    melanoma  . Sleep apnea    uses CPAP  . Stroke [facility])     PAST SURGICAL HISTORY: Past Surgical History:  Procedure Laterality Date  . APPLICATION OF CRANIAL NAVIGATION N/A 10/15/2015   Procedure: APPLICATION OF CRANIAL NAVIGATION;  Surgeon: Erline Levine, MD;  Location: [place] NEURO ORS;  Service: Neurosurgery;  Laterality: N/A;  . BIV PACEMAKER GENERATOR CHANGEOUT N/A 04/02/2019   Procedure: BIV PACEMAKER GENERATOR CHANGEOUT;  Surgeon: Evans Lance, MD;  Location: [place] CV LAB;  Service: Cardiovascular;  Laterality: N/A;  . CARDIAC CATHETERIZATION  2013  . CARDIAC SURGERY     bypass X 2  . COLONOSCOPY WITH PROPOFOL N/A 05/08/2016   diverticulosis, int hem, no f/u needed [name])  . CORONARY ARTERY BYPASS GRAFT  2013   LIMA-LAD, SVG-PDA 03/27/11 (Dr. Francee Gentile)  . CRANIOTOMY N/A 10/15/2015   Procedure: LEFT FRONTAL CRANIOTOMY TUMOR EXCISION with Curve;  Surgeon:  Erline Levine, MD;  Location: [place] NEURO ORS;  Service: Neurosurgery;  Laterality: N/A;  CRANIOTOMY TUMOR EXCISION  . EYE SURGERY    . FOOT SURGERY  08/2010  . JOINT REPLACEMENT Left    partial knee  . KNEE ARTHROSCOPY  12/2008  . LYMPH NODE BIOPSY    . PACEMAKER INSERTION    . TONSILLECTOMY      FAMILY HISTORY: Family History  Problem Relation Age of Onset  . Cancer  Mother 75       lung   . Hypertension Mother   . Hypertension Father   . Heart attack Father   . Heart disease Father   . Rheum arthritis Sister   . Cancer Maternal Grandmother     ADVANCED DIRECTIVES (Y/N):  N  HEALTH MAINTENANCE: Social History   Tobacco Use  . Smoking status: Former Smoker    Packs/day: 1.00    Years: 3.00    Pack years: 3.00    Quit date: 04/10/1957    Years since quitting: 62.9  . Smokeless tobacco: Never Used  Vaping Use  . Vaping Use: Never used  Substance Use Topics  . Alcohol use: Yes    Alcohol/week: 14.0 standard drinks    Types: 10 Glasses of wine, 4 Cans of beer per week  . Drug use: No     Colonoscopy:  PAP:  Bone density:  Lipid panel:  Allergies  Allergen Reactions  . Ezetimibe Other (See Comments)    Unknown reaction  . Simvastatin Other (See Comments)    Reaction:  Gave pt a fever  Fever - temp of 103.   In 2003    Current Outpatient Medications  Medication Sig Dispense Refill  . acetaminophen (TYLENOL) 325 MG tablet Take 2 tablets (650 mg total) by mouth every 6 (six) hours as needed for mild pain (or Fever >/= 101).    Marland Kitchen allopurinol (ZYLOPRIM) 100 MG tablet Take 200 mg by mouth daily.    Marland Kitchen amLODipine (NORVASC) 2.5 MG tablet Take 1 tablet (2.5 mg total) by mouth daily. Do not take if dizzy and Systolic BP (top number) is less than 120. 90 tablet 2  . aspirin EC 81 MG tablet Take 81 mg by mouth every evening.     . Cholecalciferol (VITAMIN D3) 25 MCG (1000 UT) CAPS Take 1 capsule (1,000 Units total) by mouth daily. 30 capsule 0  . Evolocumab (REPATHA SURECLICK) 017 MG/ML SOAJ Inject 1 pen into the skin every 14 (fourteen) days. 2 mL 11  . levothyroxine (SYNTHROID) 137 MCG tablet TAKE 1 TABLET BY MOUTH ONCE DAILY BEFORE BREAKFAST (Patient taking differently: Take 137 mcg by mouth daily before breakfast. ) 30 tablet 11  . meclizine (ANTIVERT) 12.5 MG tablet Take 1 tablet (12.5 mg total) by mouth 3 (three) times daily as needed for  dizziness. 30 tablet 0  . methylphenidate (RITALIN) 5 MG tablet Take 1 tablet (5 mg total) by mouth 2 (two) times daily with breakfast and lunch. 60 tablet 0  . metoprolol succinate (TOPROL-XL) 50 MG 24 hr tablet TAKE 1 AND 1/2 TABLET (75 MG) BY MOUTH EVERY DAY WITH OR IMMEDIATELY FOLLOWING A MEAL *DO NOT CRUSH* 50 tablet 11  . sertraline (ZOLOFT) 50 MG tablet Take 1 tablet (50 mg total) by mouth daily. Take with breakfast.  Initiate 46m dose after completing 5 days of 266m. 90 tablet 3  . testosterone cypionate (DEPOTESTOSTERONE CYPIONATE) 200 MG/ML injection INJECT 0.5ML (100MG TOTAL) INTRAMUSCULARLY EVERY 14 DAYS (SINGLE DOSE VIAL) *  ONLY GOOD FOR 1 DOSE* *CALL FOR REFILL* 2 mL 4  . traZODone (DESYREL) 50 MG tablet Take 0.5-1 tablets (25-50 mg total) by mouth at bedtime. (Patient taking differently: Take 25 mg by mouth at bedtime as needed for sleep. ) 30 tablet 1  . Turmeric Curcumin 500 MG CAPS Take by mouth.     No current facility-administered medications for this visit.   Facility-Administered Medications Ordered in Other Visits  Medication Dose Route Frequency Provider Last Rate Last Admin  . 0.9 %  sodium chloride infusion  1,000 mL Intravenous Once [name], MD        OBJECTIVE: Vitals:   03/01/20 1029  BP: 123/75  Pulse: 74  Temp: 98.1 F (36.7 C)  SpO2: 96%     Body mass index is 29.98 kg/m.    ECOG FS:1 - Symptomatic but completely ambulatory  General: Well-developed, well-nourished, no acute distress.  Sitting in a wheelchair. Eyes: Pink conjunctiva, anicteric sclera. HEENT: Normocephalic, moist mucous membranes. Lungs: No audible wheezing or coughing. Heart: Regular rate and rhythm. Abdomen: Soft, nontender, no obvious distention. Musculoskeletal: No edema, cyanosis, or clubbing. Neuro: Alert, answering all questions appropriately. Cranial nerves grossly intact. Skin: No rashes or petechiae noted. Psych: Normal affect.  LAB RESULTS:  Lab Results  Component  Value Date   NA 140 03/01/2020   K 4.8 03/01/2020   CL 106 03/01/2020   CO2 26 03/01/2020   GLUCOSE 114 (H) 03/01/2020   BUN 46 (H) 03/01/2020   CREATININE 1.73 (H) 03/01/2020   CALCIUM 9.3 03/01/2020   PROT 6.8 03/01/2020   ALBUMIN 4.1 03/01/2020   AST 18 03/01/2020   ALT 15 03/01/2020   ALKPHOS 51 03/01/2020   BILITOT 0.6 03/01/2020   GFRNONAA 39 (L) 03/01/2020   GFRAA 46 (L) 02/27/2020    Lab Results  Component Value Date   WBC 4.5 03/01/2020   NEUTROABS 3.2 03/01/2020   HGB 14.3 03/01/2020   HCT 42.4 03/01/2020   MCV 93.0 03/01/2020   PLT 158 03/01/2020     STUDIES: No results found.  ASSESSMENT: Acute promyelocytic leukemia in remission  PLAN:    1. Acute promyelocytic leukemia in remission: Patient completed his induction chemotherapy at [facility] in approximately November 2020.  Patient only completed 1 cycle of maintenance chemotherapy consisting of ATRA 14 days on 14 days off along with arsenic trioxide at 0.15 mg/kg/day Monday through Friday for 28 days prior to discontinuing treatment permanently secondary to declining performance status.  No intervention is needed at this time.  Peripheral blood PCR and FISH both revealed complete molecular remission.  Today's results are pending.  Return to clinic in 3 months with repeat laboratory work and further evaluation. 2.  Nausea: Patient does not complain of this today.  Continue ondansetron as needed. 3.  Cardiac disease/biventricular pacer: Given patient's biventricular pacing, he has a significantly abnormal EKG with a prolonged QTC of approximately 560. Continue close follow-up with cardiology as scheduled.  4.  Melanoma: PET scan results from November 26, 2019 reviewed independently and reported as above with no obvious evidence of recurrent or progressive disease.  CT of head results from November 24, 2019 also revealed no evidence of active disease.  Continue follow-up with neuro-oncology as scheduled. 5.   Prostate cancer: Patient's most recent PSA in June 2020 was reported at 4.35. 6: Renal insufficiency: Chronic and unchanged. 7.  Peripheral edema: Resolved.  Use Lasix sparingly and only as needed. 8.  Weakness and fatigue: Chronic and  unchanged. 9.  Port removal: Port has been removed.  Patient expressed understanding and was in agreement with this plan. He also understands that He can call clinic at any time with any questions, concerns, or complaints.     [name], MD   03/02/2020 10:10 AM

## 2020-03-01 ENCOUNTER — Inpatient Hospital Stay: Payer: Medicare Other | Attending: Oncology

## 2020-03-01 ENCOUNTER — Encounter: Payer: Self-pay | Admitting: Oncology

## 2020-03-01 ENCOUNTER — Inpatient Hospital Stay (HOSPITAL_BASED_OUTPATIENT_CLINIC_OR_DEPARTMENT_OTHER): Payer: Medicare Other | Admitting: Oncology

## 2020-03-01 VITALS — BP 123/75 | HR 74 | Temp 98.1°F | Wt 203.0 lb

## 2020-03-01 DIAGNOSIS — Z952 Presence of prosthetic heart valve: Secondary | ICD-10-CM | POA: Insufficient documentation

## 2020-03-01 DIAGNOSIS — Z85820 Personal history of malignant melanoma of skin: Secondary | ICD-10-CM | POA: Insufficient documentation

## 2020-03-01 DIAGNOSIS — R9431 Abnormal electrocardiogram [ECG] [EKG]: Secondary | ICD-10-CM | POA: Insufficient documentation

## 2020-03-01 DIAGNOSIS — Z801 Family history of malignant neoplasm of trachea, bronchus and lung: Secondary | ICD-10-CM | POA: Insufficient documentation

## 2020-03-01 DIAGNOSIS — Z79899 Other long term (current) drug therapy: Secondary | ICD-10-CM | POA: Diagnosis not present

## 2020-03-01 DIAGNOSIS — N189 Chronic kidney disease, unspecified: Secondary | ICD-10-CM | POA: Diagnosis not present

## 2020-03-01 DIAGNOSIS — R110 Nausea: Secondary | ICD-10-CM | POA: Diagnosis not present

## 2020-03-01 DIAGNOSIS — Z87891 Personal history of nicotine dependence: Secondary | ICD-10-CM | POA: Insufficient documentation

## 2020-03-01 DIAGNOSIS — I255 Ischemic cardiomyopathy: Secondary | ICD-10-CM | POA: Diagnosis not present

## 2020-03-01 DIAGNOSIS — R5383 Other fatigue: Secondary | ICD-10-CM | POA: Diagnosis not present

## 2020-03-01 DIAGNOSIS — C61 Malignant neoplasm of prostate: Secondary | ICD-10-CM | POA: Insufficient documentation

## 2020-03-01 DIAGNOSIS — R531 Weakness: Secondary | ICD-10-CM | POA: Insufficient documentation

## 2020-03-01 DIAGNOSIS — C9241 Acute promyelocytic leukemia, in remission: Secondary | ICD-10-CM | POA: Diagnosis not present

## 2020-03-01 DIAGNOSIS — Z96652 Presence of left artificial knee joint: Secondary | ICD-10-CM | POA: Insufficient documentation

## 2020-03-01 DIAGNOSIS — I519 Heart disease, unspecified: Secondary | ICD-10-CM | POA: Diagnosis not present

## 2020-03-01 DIAGNOSIS — Z809 Family history of malignant neoplasm, unspecified: Secondary | ICD-10-CM | POA: Insufficient documentation

## 2020-03-01 LAB — COMPREHENSIVE METABOLIC PANEL
ALT: 15 U/L (ref 0–44)
AST: 18 U/L (ref 15–41)
Albumin: 4.1 g/dL (ref 3.5–5.0)
Alkaline Phosphatase: 51 U/L (ref 38–126)
Anion gap: 8 (ref 5–15)
BUN: 46 mg/dL — ABNORMAL HIGH (ref 8–23)
CO2: 26 mmol/L (ref 22–32)
Calcium: 9.3 mg/dL (ref 8.9–10.3)
Chloride: 106 mmol/L (ref 98–111)
Creatinine, Ser: 1.73 mg/dL — ABNORMAL HIGH (ref 0.61–1.24)
GFR, Estimated: 39 mL/min — ABNORMAL LOW (ref >60)
Glucose, Bld: 114 mg/dL — ABNORMAL HIGH (ref 70–99)
Potassium: 4.8 mmol/L (ref 3.5–5.1)
Sodium: 140 mmol/L (ref 135–145)
Total Bilirubin: 0.6 mg/dL (ref 0.3–1.2)
Total Protein: 6.8 g/dL (ref 6.5–8.1)

## 2020-03-01 LAB — CBC WITH DIFFERENTIAL/PLATELET
Abs Immature Granulocytes: 0.04 10*3/uL (ref 0.00–0.07)
Basophils Absolute: 0 10*3/uL (ref 0.0–0.1)
Basophils Relative: 0 %
Eosinophils Absolute: 0.2 10*3/uL (ref 0.0–0.5)
Eosinophils Relative: 5 %
HCT: 42.4 % (ref 39.0–52.0)
Hemoglobin: 14.3 g/dL (ref 13.0–17.0)
Immature Granulocytes: 1 %
Lymphocytes Relative: 13 %
Lymphs Abs: 0.6 10*3/uL — ABNORMAL LOW (ref 0.7–4.0)
MCH: 31.4 pg (ref 26.0–34.0)
MCHC: 33.7 g/dL (ref 30.0–36.0)
MCV: 93 fL (ref 80.0–100.0)
Monocytes Absolute: 0.5 10*3/uL (ref 0.1–1.0)
Monocytes Relative: 11 %
Neutro Abs: 3.2 10*3/uL (ref 1.7–7.7)
Neutrophils Relative %: 70 %
Platelets: 158 10*3/uL (ref 150–400)
RBC: 4.56 MIL/uL (ref 4.22–5.81)
RDW: 14.6 % (ref 11.5–15.5)
WBC: 4.5 10*3/uL (ref 4.0–10.5)
nRBC: 0 % (ref 0.0–0.2)

## 2020-03-02 ENCOUNTER — Telehealth: Payer: Self-pay | Admitting: *Deleted

## 2020-03-02 NOTE — Telephone Encounter (Signed)
Pt made aware that repatha has been approved. He will contact pharmacy to have filled.

## 2020-03-02 NOTE — Telephone Encounter (Signed)
Pt required PA for Repatha 140 mg/ml. PA authorized via telephone [employer].  Member ZQ:[id number] BNL:[phone] Pt approved through 03/02/20-08/30/2020

## 2020-03-02 NOTE — Telephone Encounter (Signed)
Pt required PA for Repatha 140 mg/ml. PA authorized via telephone [employer].   Member [id number] UOH:[phone] Pt approved through 03/02/20-08/30/2020

## 2020-03-09 LAB — PML/RAR TRANSLOCATION BY PCR

## 2020-03-10 DIAGNOSIS — F5105 Insomnia due to other mental disorder: Secondary | ICD-10-CM | POA: Diagnosis not present

## 2020-03-10 DIAGNOSIS — F332 Major depressive disorder, recurrent severe without psychotic features: Secondary | ICD-10-CM | POA: Diagnosis not present

## 2020-03-26 ENCOUNTER — Telehealth: Payer: Self-pay

## 2020-03-26 NOTE — Telephone Encounter (Signed)
Volunteer called patient on behalf of Palliative Care and did not get a answer from patient/family. ° °

## 2020-03-29 ENCOUNTER — Other Ambulatory Visit: Payer: Self-pay

## 2020-03-29 ENCOUNTER — Encounter: Payer: Self-pay | Admitting: Podiatry

## 2020-03-29 ENCOUNTER — Ambulatory Visit (INDEPENDENT_AMBULATORY_CARE_PROVIDER_SITE_OTHER): Payer: Medicare Other | Admitting: Podiatry

## 2020-03-29 DIAGNOSIS — M79674 Pain in right toe(s): Secondary | ICD-10-CM | POA: Diagnosis not present

## 2020-03-29 DIAGNOSIS — B351 Tinea unguium: Secondary | ICD-10-CM

## 2020-03-29 DIAGNOSIS — E119 Type 2 diabetes mellitus without complications: Secondary | ICD-10-CM | POA: Diagnosis not present

## 2020-03-29 DIAGNOSIS — M79675 Pain in left toe(s): Secondary | ICD-10-CM

## 2020-03-29 NOTE — Progress Notes (Signed)
This patient returns to my office for at risk foot care.  This patient requires this care by a professional since this patient will be at risk due to having diabetes type 2.    This patient is unable to cut nails himself since the patient cannot reach his nails.These nails are painful walking and wearing shoes.  This patient presents for at risk foot care today.  General Appearance  Alert, conversant and in no acute stress.  Vascular  Dorsalis pedis and posterior tibial  pulses are palpable  bilaterally.  Capillary return is within normal limits  bilaterally. Temperature is within normal limits  bilaterally.  Neurologic  [name]-Weinstein monofilament wire test within normal limits  bilaterally. Muscle power within normal limits bilaterally.  Nails Thick disfigured discolored nails with subungual debris  Hallux nails  B/l.Marland Kitchen No evidence of bacterial infection or drainage bilaterally.  Orthopedic  No limitations of motion  feet .  No crepitus or effusions noted.  No bony pathology or digital deformities noted.  Skin  normotropic skin with no porokeratosis noted bilaterally.  No signs of infections or ulcers noted.     Onychomycosis  Pain in right toes  Pain in left toes  Consent was obtained for treatment procedures.   Mechanical debridement of nails 1-5  bilaterally performed with a nail nipper.  Filed with dremel without incident.    Return office visit  4 months                    Told patient to return for periodic foot care and evaluation due to potential at risk complications.   Gardiner Barefoot DPM

## 2020-03-30 ENCOUNTER — Ambulatory Visit (INDEPENDENT_AMBULATORY_CARE_PROVIDER_SITE_OTHER): Payer: Medicare Other

## 2020-03-30 DIAGNOSIS — I255 Ischemic cardiomyopathy: Secondary | ICD-10-CM | POA: Diagnosis not present

## 2020-03-30 LAB — CUP PACEART REMOTE DEVICE CHECK
Battery Remaining Longevity: 75 mo
Battery Remaining Percentage: 95.5 %
Battery Voltage: 2.96 V
Brady Statistic AP VP Percent: 99 %
Brady Statistic AP VS Percent: 1 %
Brady Statistic AS VP Percent: 1 %
Brady Statistic AS VS Percent: 1 %
Brady Statistic RA Percent Paced: 99 %
Date Time Interrogation Session: 20211221020012
Implantable Lead Implant Date: 19980107
Implantable Lead Implant Date: 19980107
Implantable Lead Implant Date: 20141118
Implantable Lead Location: 753858
Implantable Lead Location: 753859
Implantable Lead Location: 753860
Implantable Pulse Generator Implant Date: 20201223
Lead Channel Impedance Value: 1050 Ohm
Lead Channel Impedance Value: 350 Ohm
Lead Channel Impedance Value: 430 Ohm
Lead Channel Pacing Threshold Amplitude: 0.625 V
Lead Channel Pacing Threshold Amplitude: 0.75 V
Lead Channel Pacing Threshold Amplitude: 1.25 V
Lead Channel Pacing Threshold Pulse Width: 0.5 ms
Lead Channel Pacing Threshold Pulse Width: 0.5 ms
Lead Channel Pacing Threshold Pulse Width: 1 ms
Lead Channel Sensing Intrinsic Amplitude: 2.6 mV
Lead Channel Setting Pacing Amplitude: 1.75 V
Lead Channel Setting Pacing Amplitude: 2 V
Lead Channel Setting Pacing Amplitude: 2.25 V
Lead Channel Setting Pacing Pulse Width: 0.5 ms
Lead Channel Setting Pacing Pulse Width: 1 ms
Lead Channel Setting Sensing Sensitivity: 4 mV
Pulse Gen Model: 3562
Pulse Gen Serial Number: 6033885

## 2020-04-06 ENCOUNTER — Inpatient Hospital Stay: Payer: Medicare Other | Attending: Hospice and Palliative Medicine | Admitting: Hospice and Palliative Medicine

## 2020-04-06 DIAGNOSIS — Z515 Encounter for palliative care: Secondary | ICD-10-CM

## 2020-04-06 DIAGNOSIS — C9241 Acute promyelocytic leukemia, in remission: Secondary | ICD-10-CM | POA: Diagnosis not present

## 2020-04-06 NOTE — Progress Notes (Signed)
Virtual Visit via Telephone Note  I connected with David [name]. on 04/06/20 at  2:00 PM EST by telephone and verified that I am speaking with the correct person using two identifiers.   I discussed the limitations, risks, security and privacy concerns of performing an evaluation and management service by telephone and the availability of in person appointments. I also discussed with the patient that there may be a patient responsible charge related to this service. The patient expressed understanding and agreed to proceed.   History of Present Illness: David [name]. is a 79 y.o. 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  Patient was receiving arsenic for his APML but this was discontinued due to poor tolerance.  Patient was referred to palliative care to help address goals and manage ongoing symptoms.   Observations/Objective: Spoke with patient by phone.  He reports several weeks of worsening vertigo for which he was evaluated by ENT.  He says that he has "vertigo medication" that he has been taking with reasonable efficacy.  Given patient's history of brain lesion, would be reasonable to consider reimaging.  Discussed option with patient but he wanted to see if symptoms would resolve on their own.  I encouraged him to call us if symptoms do not improve and we can consider repeat MRI of the brain.  Last MRI was in August.  Patient denies any other symptomatic complaints.  Overall, he felt he was doing reasonably well.  Assessment and Plan: APML -on best supportive care.  Followed by home-based palliative care.   Vertigo -consider repeat MRI of the brain if patient is interested  Follow Up Instructions: RTC on 06/29/2020 with Dr. Grayland Ormond   I discussed the  assessment and treatment plan with the patient. The patient was provided an opportunity to ask questions and all were answered. The patient agreed with the plan and demonstrated an understanding of the instructions.   The patient was advised to call back or seek an in-person evaluation if the symptoms worsen or if the condition fails to improve as anticipated.  I provided 5 minutes of non-face-to-face time during this encounter.   Irean Hong, NP

## 2020-04-13 NOTE — Progress Notes (Signed)
Remote pacemaker transmission.   

## 2020-04-22 ENCOUNTER — Telehealth: Payer: Self-pay | Admitting: *Deleted

## 2020-04-22 NOTE — Telephone Encounter (Signed)
Prescription for Ritalin mailed 04/22/20 to [facility]

## 2020-04-27 ENCOUNTER — Other Ambulatory Visit: Payer: Self-pay | Admitting: Internal Medicine

## 2020-05-05 DIAGNOSIS — L570 Actinic keratosis: Secondary | ICD-10-CM | POA: Diagnosis not present

## 2020-05-05 DIAGNOSIS — Z85820 Personal history of malignant melanoma of skin: Secondary | ICD-10-CM | POA: Diagnosis not present

## 2020-05-05 DIAGNOSIS — L218 Other seborrheic dermatitis: Secondary | ICD-10-CM | POA: Diagnosis not present

## 2020-05-05 DIAGNOSIS — L738 Other specified follicular disorders: Secondary | ICD-10-CM | POA: Diagnosis not present

## 2020-05-05 DIAGNOSIS — C4441 Basal cell carcinoma of skin of scalp and neck: Secondary | ICD-10-CM | POA: Diagnosis not present

## 2020-05-05 DIAGNOSIS — D2272 Melanocytic nevi of left lower limb, including hip: Secondary | ICD-10-CM | POA: Diagnosis not present

## 2020-05-05 DIAGNOSIS — D485 Neoplasm of uncertain behavior of skin: Secondary | ICD-10-CM | POA: Diagnosis not present

## 2020-05-05 DIAGNOSIS — D1801 Hemangioma of skin and subcutaneous tissue: Secondary | ICD-10-CM | POA: Diagnosis not present

## 2020-05-05 DIAGNOSIS — C44212 Basal cell carcinoma of skin of right ear and external auricular canal: Secondary | ICD-10-CM | POA: Diagnosis not present

## 2020-05-05 DIAGNOSIS — Z85828 Personal history of other malignant neoplasm of skin: Secondary | ICD-10-CM | POA: Diagnosis not present

## 2020-05-05 DIAGNOSIS — L821 Other seborrheic keratosis: Secondary | ICD-10-CM | POA: Diagnosis not present

## 2020-05-10 DIAGNOSIS — C44212 Basal cell carcinoma of skin of right ear and external auricular canal: Secondary | ICD-10-CM | POA: Diagnosis not present

## 2020-05-10 DIAGNOSIS — Z85820 Personal history of malignant melanoma of skin: Secondary | ICD-10-CM | POA: Diagnosis not present

## 2020-05-10 DIAGNOSIS — Z85828 Personal history of other malignant neoplasm of skin: Secondary | ICD-10-CM | POA: Diagnosis not present

## 2020-05-25 ENCOUNTER — Telehealth: Payer: Self-pay

## 2020-05-25 NOTE — Telephone Encounter (Signed)
Received faxed order from [facility].  Placed order in Dr. Synthia Innocent box.

## 2020-05-27 IMAGING — CT CT HEAD WO/W CM
5 series · 17 of 47 positions shown, 19 images · IV contrast (omnipaque)
Comparison: 02/14/2018

CLINICAL DATA: Follow-up metastatic means.

EXAM:
CT HEAD WITHOUT AND WITH CONTRAST
TECHNIQUE: Contiguous axial images were obtained from the base of the skull
through the vertex without and with intravenous contrast
CONTRAST:  75mL OMNIPAQUE IOHEXOL 300 MG/ML  SOLN

[Series 2: head wo · axial · 0.43mm/px · z∈[+532,+622]mm · 4 of 31 slices shown]
[im 7/31  brain]
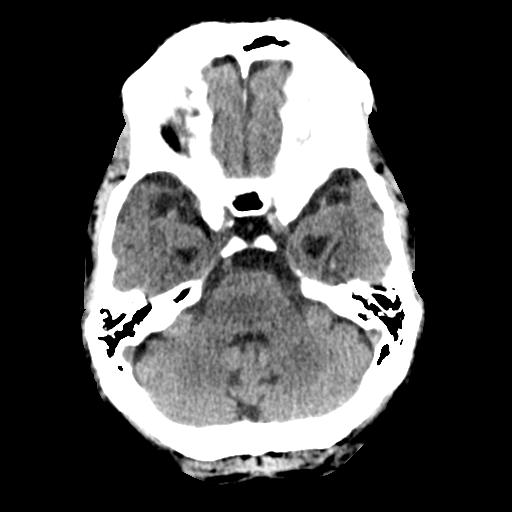
[im 13/31  brain]
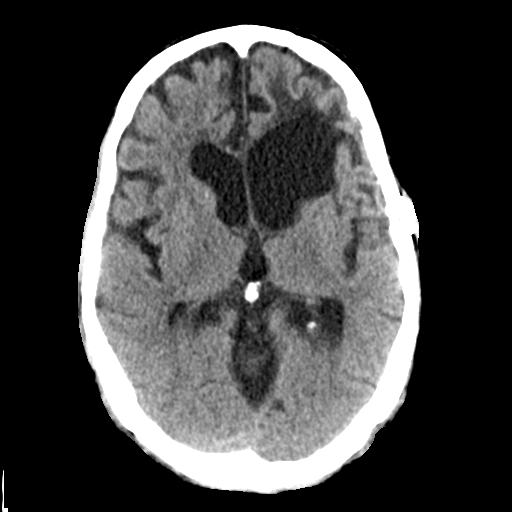
[im 19/31  brain]
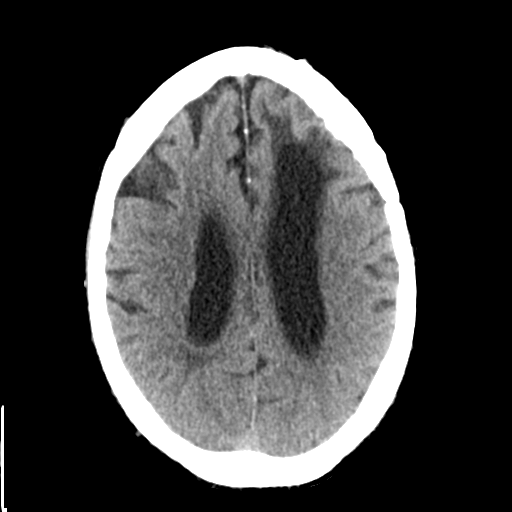
[im 25/31  brain]
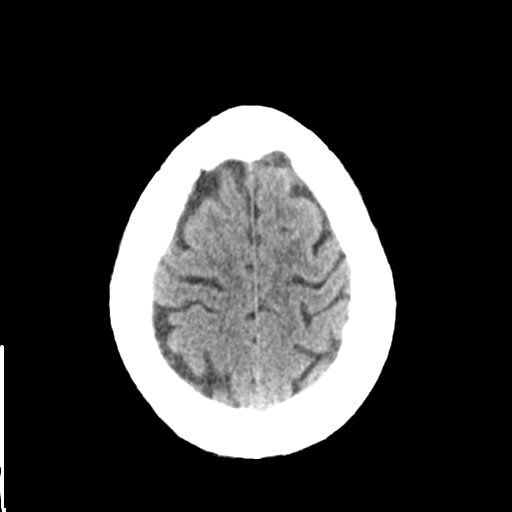

[Series 3: head bone · axial · 0.43mm/px · z∈[+510,+530]mm · 2 of 78 slices shown]
[im 5/78  bone]
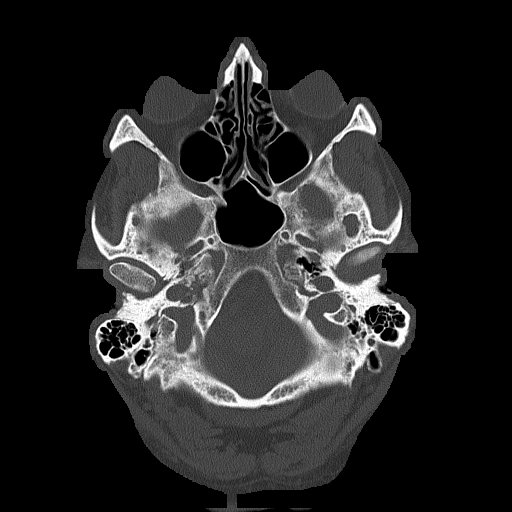
[im 15/78  bone]
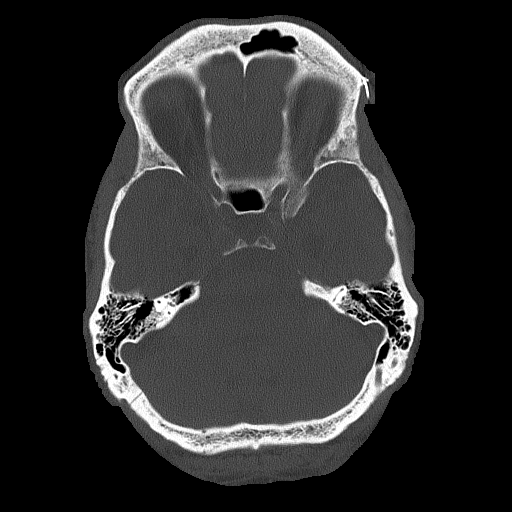

[Series 4: head w · axial · 0.43mm/px · z∈[+533,+633]mm · 5 of 31 slices shown, 7 images]
[im 6/31  brain]
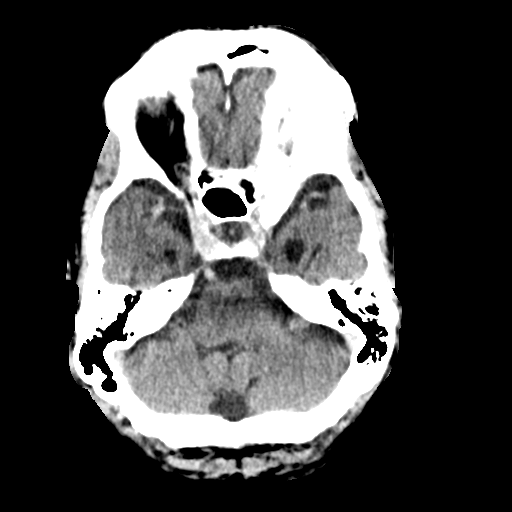
[im 6/31  bone]
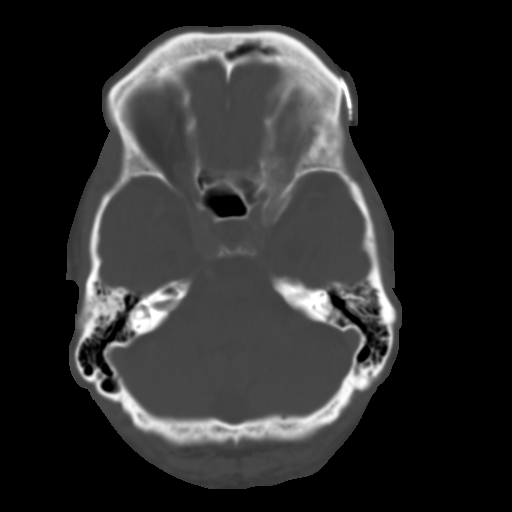
[im 11/31  brain]
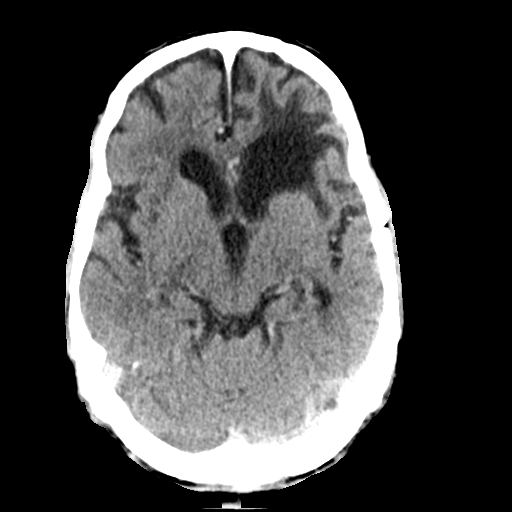
[im 16/31  brain]
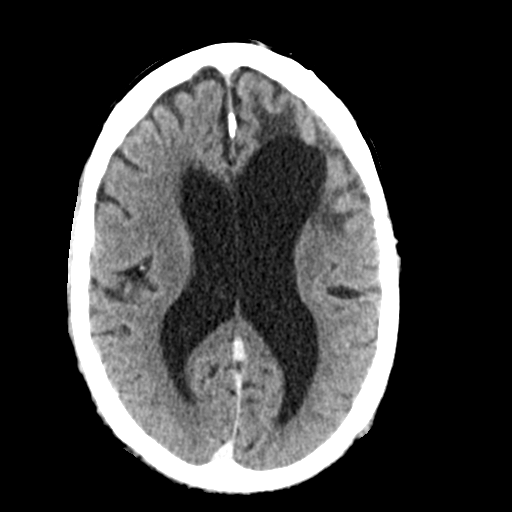
[im 21/31  brain]
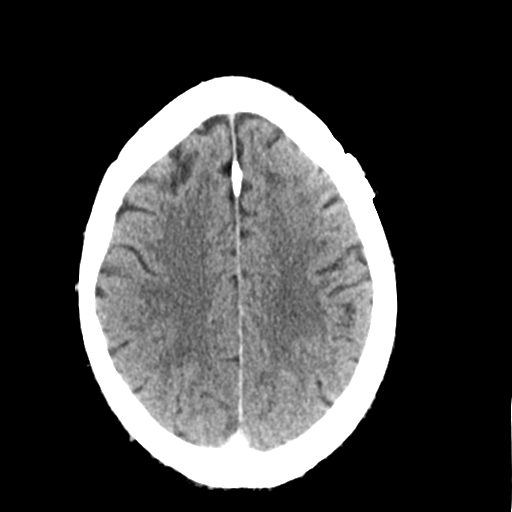
[im 26/31  brain]
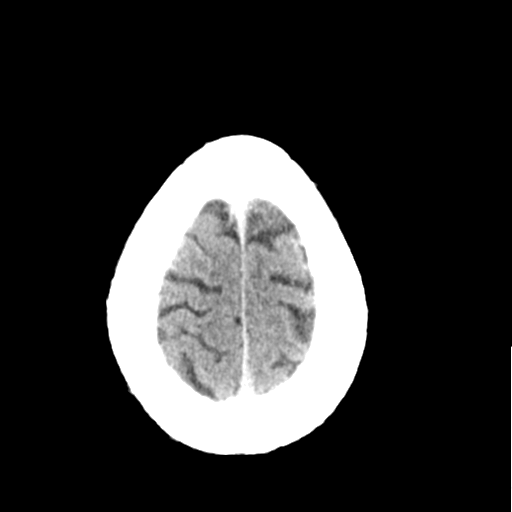
[im 26/31  bone]
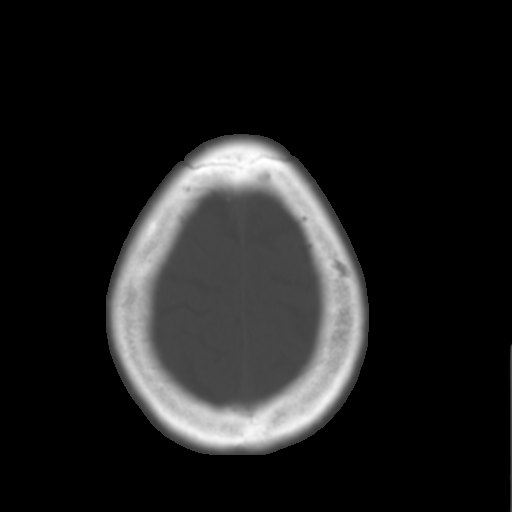

[Series 5: coronal soft tissue · coronal · 0.31mm/px · 3 of 71 slices shown]
[im 24/71  brain]
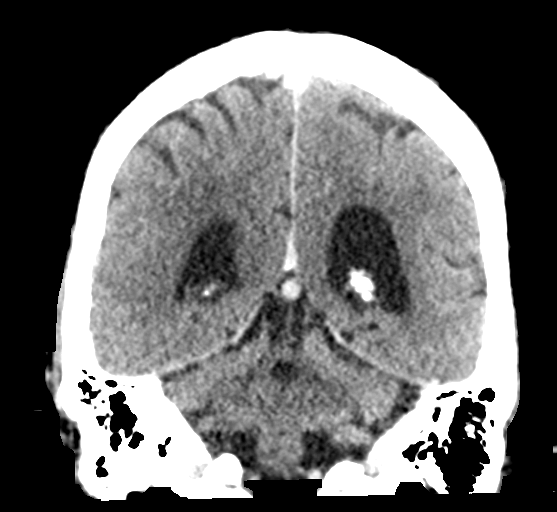
[im 32/71  brain]
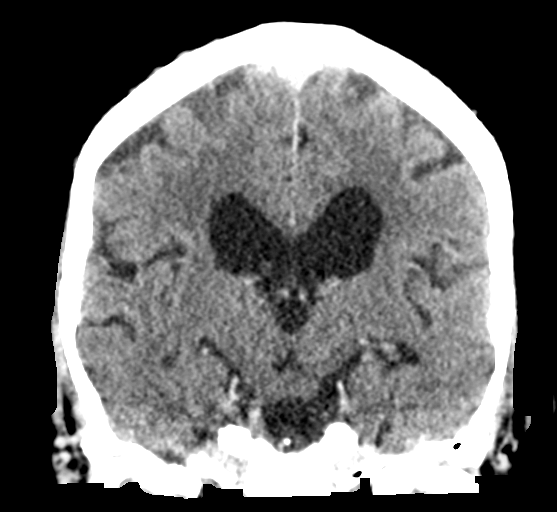
[im 39/71  brain]
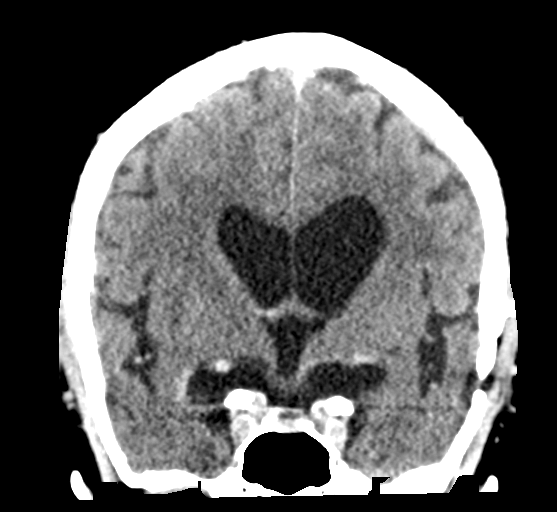

[Series 6: sagittal soft tissue · sagittal · 0.32mm/px · 3 of 57 slices shown]
[im 19/57  brain]
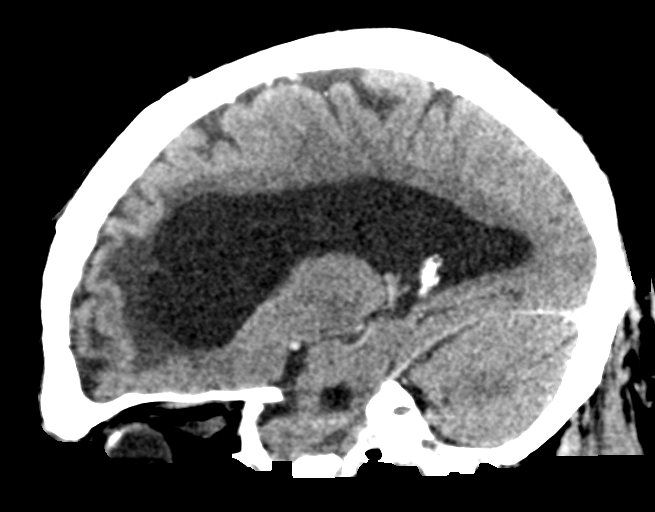
[im 29/57  brain]
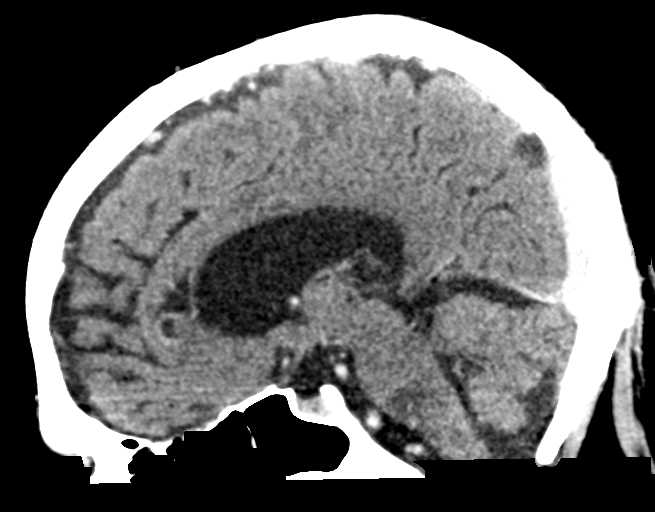
[im 38/57  brain]
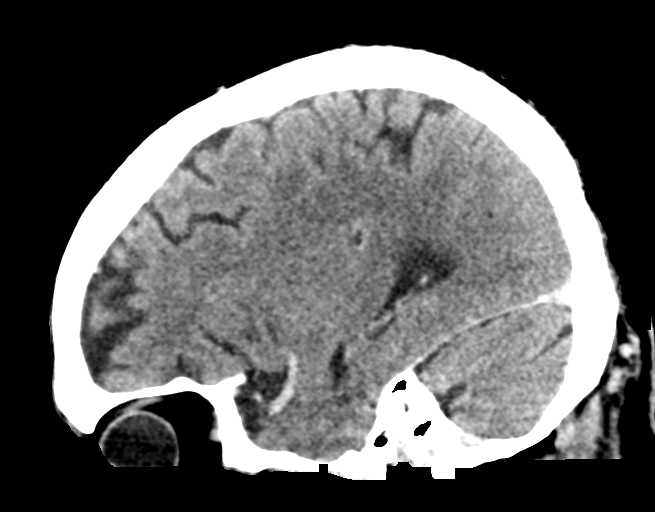

[17 of 47 positions shown; findings below may reference images not displayed]

FINDINGS: Brain: Enhancing focus in the right cerebellum is no longer seen.
Postoperative left frontal lobe with prominent volume loss and
encephalomalacia. No abnormal enhancement throughout the brain. No
acute or interval infarct, hemorrhage, hydrocephalus, or collection.

Vascular: Major vessels are enhancing

Skull: Unremarkable remote left frontal craniotomy. Negative for
mass.

Sinuses/Orbits: Negative
IMPRESSION: No evidence of active metastatic disease. Stable post treatment
brain.

## 2020-05-31 MED ORDER — TURMERIC 500 MG PO CAPS: 2.0000 | ORAL_CAPSULE | Freq: Every day | ORAL | Status: AC | PRN

## 2020-05-31 NOTE — Telephone Encounter (Signed)
Faxed order

## 2020-05-31 NOTE — Telephone Encounter (Signed)
Reviewed, placed in Lisa's box.

## 2020-05-31 NOTE — Addendum Note (Signed)
Addended by: [name] on: 05/31/2020 07:44 AM   Modules accepted: Orders

## 2020-06-01 ENCOUNTER — Telehealth: Payer: Self-pay

## 2020-06-01 NOTE — Telephone Encounter (Signed)
Volunteer called patient on behalf of Palliative Care. Patient is doing well at this time.  

## 2020-06-03 ENCOUNTER — Other Ambulatory Visit: Payer: Self-pay | Admitting: Internal Medicine

## 2020-06-03 MED ORDER — METHYLPHENIDATE HCL 5 MG PO TABS: ORAL_TABLET | ORAL | 0 refills | Status: DC

## 2020-06-05 NOTE — Progress Notes (Signed)
[place]  [phone] [phone]  ID: David [name]. OB: 1941/02/10  MR#: [id number]  YDX#:[id number]  Patient Care Team: [name], MD as PCP - General (Family Medicine) Minna Merritts, MD as PCP - Cardiology (Cardiology) Deboraha Sprang, MD as PCP - Electrophysiology (Cardiology) Cira Rue, RN Nurse Navigator as Registered Nurse ([place]) Acquanetta Chain, DO as Consulting Physician [facility] and Palliative Medicine) [name], MD as Consulting Physician (Hematology and Oncology)  CHIEF COMPLAINT: Acute promyelocytic leukemia in remission  INTERVAL HISTORY: Patient returns to clinic today for repeat laboratory work and further evaluation.  He continues to have weakness and fatigue, but this has improved and he remains active. He has no neurologic complaints.  He has a good appetite and denies weight loss.  He denies any recent fevers or illnesses. He denies any chest pain, shortness of breath, cough, or hemoptysis.  He denies any nausea, vomiting, constipation, or diarrhea.  He has no urinary complaints.  Patient offers no further specific complaints today.  REVIEW OF SYSTEMS:   Review of Systems  Constitutional: Positive for malaise/fatigue. Negative for fever and weight loss.  Respiratory: Negative.  Negative for cough, hemoptysis and shortness of breath.   Cardiovascular: Negative.  Negative for chest pain and leg swelling.  Gastrointestinal: Negative.  Negative for abdominal pain and nausea.  Genitourinary: Negative.  Negative for hematuria.  Musculoskeletal: Negative.  Negative for back pain.  Skin: Negative.  Negative for rash.  Neurological: Positive for weakness. Negative for dizziness, focal weakness and headaches.  Psychiatric/Behavioral: Negative.  The patient is not nervous/anxious.     As per HPI. Otherwise, a complete review of systems is negative.  PAST MEDICAL HISTORY: Past Medical  History:  Diagnosis Date  . Anginal pain ([place])   . Arthritis    mostly hands  . Benign familial hematuria   . BPH (benign prostatic hypertrophy)   . Brain cancer ([place])    melanoma with brain met  . Coronary artery disease   . GERD (gastroesophageal reflux disease)   . High cholesterol   . Hypertension   . Hypothyroidism   . Intracerebral hemorrhage ([place])   . Melanoma ([place])   . Metastatic melanoma to lung ([place])   . Mood disorder ([place])   . Myocardial infarction ([place])   . Pacemaker    due to syncope, 3rd degree HB (upgrade to [facility]. Jude CRT-P 02/25/13 (Dr. Uvaldo Rising)  . Rectal bleed    due to NSAIDS  . Renal disorder   . Sepsis ([place]) 12/09/2018  . Skin cancer    melanoma  . Sleep apnea    uses CPAP  . Stroke [facility])     PAST SURGICAL HISTORY: Past Surgical History:  Procedure Laterality Date  . APPLICATION OF CRANIAL NAVIGATION N/A 10/15/2015   Procedure: APPLICATION OF CRANIAL NAVIGATION;  Surgeon: Erline Levine, MD;  Location: [place] NEURO ORS;  Service: Neurosurgery;  Laterality: N/A;  . BIV PACEMAKER GENERATOR CHANGEOUT N/A 04/02/2019   Procedure: BIV PACEMAKER GENERATOR CHANGEOUT;  Surgeon: Evans Lance, MD;  Location: [place] CV LAB;  Service: Cardiovascular;  Laterality: N/A;  . CARDIAC CATHETERIZATION  2013  . CARDIAC SURGERY     bypass X 2  . COLONOSCOPY WITH PROPOFOL N/A 05/08/2016   diverticulosis, int hem, no f/u needed [name])  . CORONARY ARTERY BYPASS GRAFT  2013   LIMA-LAD, SVG-PDA 03/27/11 (Dr. Francee Gentile)  . CRANIOTOMY N/A 10/15/2015   Procedure: LEFT FRONTAL CRANIOTOMY TUMOR EXCISION with  Curve;  Surgeon: Erline Levine, MD;  Location: [place] NEURO ORS;  Service: Neurosurgery;  Laterality: N/A;  CRANIOTOMY TUMOR EXCISION  . EYE SURGERY    . FOOT SURGERY  08/2010  . JOINT REPLACEMENT Left    partial knee  . KNEE ARTHROSCOPY  12/2008  . LYMPH NODE BIOPSY    . PACEMAKER INSERTION    . TONSILLECTOMY      FAMILY HISTORY: Family History  Problem Relation Age of Onset   . Cancer Mother 58       lung   . Hypertension Mother   . Hypertension Father   . Heart attack Father   . Heart disease Father   . Rheum arthritis Sister   . Cancer Maternal Grandmother     ADVANCED DIRECTIVES (Y/N):  N  HEALTH MAINTENANCE: Social History   Tobacco Use  . Smoking status: Former Smoker    Packs/day: 1.00    Years: 3.00    Pack years: 3.00    Quit date: 04/10/1957    Years since quitting: 63.2  . Smokeless tobacco: Never Used  Vaping Use  . Vaping Use: Never used  Substance Use Topics  . Alcohol use: Yes    Alcohol/week: 14.0 standard drinks    Types: 10 Glasses of wine, 4 Cans of beer per week  . Drug use: No     Colonoscopy:  PAP:  Bone density:  Lipid panel:  Allergies  Allergen Reactions  . Ezetimibe Other (See Comments)    Unknown reaction  . Simvastatin Other (See Comments)    Reaction:  Gave pt a fever  Fever - temp of 103.   In 2003    Current Outpatient Medications  Medication Sig Dispense Refill  . acetaminophen (TYLENOL) 325 MG tablet Take 2 tablets (650 mg total) by mouth every 6 (six) hours as needed for mild pain (or Fever >/= 101).    Marland Kitchen allopurinol (ZYLOPRIM) 100 MG tablet Take 200 mg by mouth daily.    Marland Kitchen amLODipine (NORVASC) 2.5 MG tablet Take 1 tablet (2.5 mg total) by mouth daily. Do not take if dizzy and Systolic BP (top number) is less than 120. 90 tablet 2  . aspirin EC 81 MG tablet Take 81 mg by mouth every evening.     . Cholecalciferol (VITAMIN D3) 25 MCG (1000 UT) CAPS Take 1 capsule (1,000 Units total) by mouth daily. 30 capsule 0  . colchicine 0.6 MG tablet Take 1 tablet (0.6 mg total) by mouth daily as needed (gout flare). May take 2 tablets on first day    . Evolocumab (REPATHA SURECLICK) 034 MG/ML SOAJ Inject 1 pen into the skin every 14 (fourteen) days. 2 mL 11  . furosemide (LASIX) 20 MG tablet Take 1 tablet (20 mg total) by mouth every other day.    . insulin lispro (HUMALOG) 100 UNIT/ML KwikPen Use per sliding  scale - 2 units for every 50 over 150    . levothyroxine (SYNTHROID) 137 MCG tablet TAKE 1 TABLET BY MOUTH ONCE DAILY BEFORE BREAKFAST (Patient taking differently: Take 137 mcg by mouth daily before breakfast.) 30 tablet 11  . meclizine (ANTIVERT) 12.5 MG tablet Take 1 tablet (12.5 mg total) by mouth 3 (three) times daily as needed for dizziness. 30 tablet 0  . methylphenidate (RITALIN) 5 MG tablet TAKE 1 TABLET BY MOUTH TWICE DAILY AT BREAKFAST & LUNCH 60 tablet 0  . metoprolol succinate (TOPROL-XL) 50 MG 24 hr tablet TAKE 1 AND 1/2 TABLET (75 MG) BY MOUTH  EVERY DAY WITH OR IMMEDIATELY FOLLOWING A MEAL *DO NOT CRUSH* 50 tablet 11  . sertraline (ZOLOFT) 50 MG tablet Take 1 tablet (50 mg total) by mouth daily. Take with breakfast.  Initiate 66m dose after completing 5 days of 237m. 90 tablet 3  . testosterone cypionate (DEPOTESTOSTERONE CYPIONATE) 200 MG/ML injection INJECT 0.5ML (100MG TOTAL) INTRAMUSCULARLY EVERY 14 DAYS (SINGLE DOSE VIAL) *ONLY GOOD FOR 1 DOSE* *CALL FOR REFILL* 2 mL 4  . traZODone (DESYREL) 50 MG tablet Take 0.5-1 tablets (25-50 mg total) by mouth at bedtime. (Patient taking differently: Take 25 mg by mouth at bedtime as needed for sleep.) 30 tablet 1  . Turmeric 500 MG CAPS Take 2 capsules by mouth daily as needed (joint pain).     No current facility-administered medications for this visit.   Facility-Administered Medications Ordered in Other Visits  Medication Dose Route Frequency Provider Last Rate Last Admin  . 0.9 %  sodium chloride infusion  1,000 mL Intravenous Once FeTruitt MerleMD        OBJECTIVE: Vitals:   06/09/20 1109  BP: 137/83  Pulse: 74  Resp: 18  Temp: 97.9 F (36.6 C)     Body mass index is 30.42 kg/m.    ECOG FS:0 - Asymptomatic  General: Well-developed, well-nourished, no acute distress. Eyes: Pink conjunctiva, anicteric sclera. HEENT: Normocephalic, moist mucous membranes. Lungs: No audible wheezing or coughing. Heart: Regular rate and  rhythm. Abdomen: Soft, nontender, no obvious distention. Musculoskeletal: No edema, cyanosis, or clubbing. Neuro: Alert, answering all questions appropriately. Cranial nerves grossly intact. Skin: No rashes or petechiae noted. Psych: Normal affect.  LAB RESULTS:  Lab Results  Component Value Date   NA 139 06/09/2020   K 5.0 06/09/2020   CL 105 06/09/2020   CO2 24 06/09/2020   GLUCOSE 129 (H) 06/09/2020   BUN 39 (H) 06/09/2020   CREATININE 1.64 (H) 06/09/2020   CALCIUM 9.0 06/09/2020   PROT 6.8 06/09/2020   ALBUMIN 4.0 06/09/2020   AST 22 06/09/2020   ALT 17 06/09/2020   ALKPHOS 59 06/09/2020   BILITOT 0.7 06/09/2020   GFRNONAA 42 (L) 06/09/2020   GFRAA 46 (L) 02/27/2020    Lab Results  Component Value Date   WBC 5.1 06/09/2020   NEUTROABS 3.7 06/09/2020   HGB 15.6 06/09/2020   HCT 47.5 06/09/2020   MCV 94.4 06/09/2020   PLT 143 (L) 06/09/2020     STUDIES: No results found.  ASSESSMENT: Acute promyelocytic leukemia in remission  PLAN:    1. Acute promyelocytic leukemia in remission: Patient [facility].  Patient only completed 1 cycle of maintenance chemotherapy consisting of ATRA 14 days on 14 days off along with arsenic trioxide at 0.15 mg/kg/day Monday through Friday for 28 days prior to discontinuing treatment permanently secondary to declining performance status.  No intervention is needed at this time.  Peripheral blood PCR and FISH both revealed complete molecular remission.  Today's results are pending at time of dictation.  Return to clinic in 3 months with repeat laboratory work and further evaluation.  2.  Nausea: Patient does not complain of this today.  Continue ondansetron as needed. 3.  Cardiac disease/biventricular pacer: Given patient's biventricular pacing, he has a significantly abnormal EKG with a prolonged QTC of approximately 560. Continue close follow-up with cardiology as  scheduled.  4.  Melanoma: PET scan results from November 26, 2019 reviewed independently and reported as above with no obvious  evidence of recurrent or progressive disease.  CT of head results from November 24, 2019 also revealed no evidence of active disease.  Continue follow-up with neuro-oncology as scheduled. 5.  Prostate cancer: Patient's most recent PSA in June 2020 was reported at 4.35. 6: Renal insufficiency: Chronic and unchanged.  Patient's creatinine is 1.64 today. 7.  Peripheral edema: Resolved.  Use Lasix sparingly and only as needed. 8.  Weakness and fatigue: Mildly improved. 9.  Port removal: Order has been sent for port removal. 10.  Thrombocytopenia: Mild, monitor.  Patient expressed understanding and was in agreement with this plan. He also understands that He can call clinic at any time with any questions, concerns, or complaints.     [name], MD   06/11/2020 6:11 AM

## 2020-06-09 ENCOUNTER — Inpatient Hospital Stay (HOSPITAL_BASED_OUTPATIENT_CLINIC_OR_DEPARTMENT_OTHER): Payer: Medicare Other | Admitting: Oncology

## 2020-06-09 ENCOUNTER — Inpatient Hospital Stay: Payer: Medicare Other | Attending: Oncology

## 2020-06-09 ENCOUNTER — Encounter: Payer: Self-pay | Admitting: Oncology

## 2020-06-09 VITALS — BP 137/83 | HR 74 | Temp 97.9°F | Resp 18 | Wt 206.0 lb

## 2020-06-09 DIAGNOSIS — N189 Chronic kidney disease, unspecified: Secondary | ICD-10-CM | POA: Diagnosis not present

## 2020-06-09 DIAGNOSIS — R5383 Other fatigue: Secondary | ICD-10-CM | POA: Insufficient documentation

## 2020-06-09 DIAGNOSIS — Z801 Family history of malignant neoplasm of trachea, bronchus and lung: Secondary | ICD-10-CM | POA: Insufficient documentation

## 2020-06-09 DIAGNOSIS — R531 Weakness: Secondary | ICD-10-CM | POA: Diagnosis not present

## 2020-06-09 DIAGNOSIS — C9241 Acute promyelocytic leukemia, in remission: Secondary | ICD-10-CM

## 2020-06-09 DIAGNOSIS — Z809 Family history of malignant neoplasm, unspecified: Secondary | ICD-10-CM | POA: Insufficient documentation

## 2020-06-09 DIAGNOSIS — C61 Malignant neoplasm of prostate: Secondary | ICD-10-CM | POA: Insufficient documentation

## 2020-06-09 DIAGNOSIS — R9431 Abnormal electrocardiogram [ECG] [EKG]: Secondary | ICD-10-CM | POA: Diagnosis not present

## 2020-06-09 DIAGNOSIS — I519 Heart disease, unspecified: Secondary | ICD-10-CM | POA: Insufficient documentation

## 2020-06-09 DIAGNOSIS — D696 Thrombocytopenia, unspecified: Secondary | ICD-10-CM | POA: Insufficient documentation

## 2020-06-09 DIAGNOSIS — Z950 Presence of cardiac pacemaker: Secondary | ICD-10-CM | POA: Insufficient documentation

## 2020-06-09 DIAGNOSIS — Z9221 Personal history of antineoplastic chemotherapy: Secondary | ICD-10-CM | POA: Diagnosis not present

## 2020-06-09 DIAGNOSIS — Z87891 Personal history of nicotine dependence: Secondary | ICD-10-CM | POA: Diagnosis not present

## 2020-06-09 DIAGNOSIS — Z85820 Personal history of malignant melanoma of skin: Secondary | ICD-10-CM | POA: Diagnosis not present

## 2020-06-09 LAB — COMPREHENSIVE METABOLIC PANEL
ALT: 17 U/L (ref 0–44)
AST: 22 U/L (ref 15–41)
Albumin: 4 g/dL (ref 3.5–5.0)
Alkaline Phosphatase: 59 U/L (ref 38–126)
Anion gap: 10 (ref 5–15)
BUN: 39 mg/dL — ABNORMAL HIGH (ref 8–23)
CO2: 24 mmol/L (ref 22–32)
Calcium: 9 mg/dL (ref 8.9–10.3)
Chloride: 105 mmol/L (ref 98–111)
Creatinine, Ser: 1.64 mg/dL — ABNORMAL HIGH (ref 0.61–1.24)
GFR, Estimated: 42 mL/min — ABNORMAL LOW (ref >60)
Glucose, Bld: 129 mg/dL — ABNORMAL HIGH (ref 70–99)
Potassium: 5 mmol/L (ref 3.5–5.1)
Sodium: 139 mmol/L (ref 135–145)
Total Bilirubin: 0.7 mg/dL (ref 0.3–1.2)
Total Protein: 6.8 g/dL (ref 6.5–8.1)

## 2020-06-09 LAB — CBC WITH DIFFERENTIAL/PLATELET
Abs Immature Granulocytes: 0.02 10*3/uL (ref 0.00–0.07)
Basophils Absolute: 0 10*3/uL (ref 0.0–0.1)
Basophils Relative: 1 %
Eosinophils Absolute: 0.2 10*3/uL (ref 0.0–0.5)
Eosinophils Relative: 4 %
HCT: 47.5 % (ref 39.0–52.0)
Hemoglobin: 15.6 g/dL (ref 13.0–17.0)
Immature Granulocytes: 0 %
Lymphocytes Relative: 15 %
Lymphs Abs: 0.8 10*3/uL (ref 0.7–4.0)
MCH: 31 pg (ref 26.0–34.0)
MCHC: 32.8 g/dL (ref 30.0–36.0)
MCV: 94.4 fL (ref 80.0–100.0)
Monocytes Absolute: 0.4 10*3/uL (ref 0.1–1.0)
Monocytes Relative: 7 %
Neutro Abs: 3.7 10*3/uL (ref 1.7–7.7)
Neutrophils Relative %: 73 %
Platelets: 143 10*3/uL — ABNORMAL LOW (ref 150–400)
RBC: 5.03 MIL/uL (ref 4.22–5.81)
RDW: 14.7 % (ref 11.5–15.5)
WBC: 5.1 10*3/uL (ref 4.0–10.5)
nRBC: 0 % (ref 0.0–0.2)

## 2020-06-09 NOTE — Progress Notes (Signed)
Pt in for follow up, denies any concerns today. 

## 2020-06-11 ENCOUNTER — Encounter: Payer: Self-pay | Admitting: Internal Medicine

## 2020-06-17 ENCOUNTER — Other Ambulatory Visit: Payer: Self-pay

## 2020-06-17 ENCOUNTER — Ambulatory Visit (INDEPENDENT_AMBULATORY_CARE_PROVIDER_SITE_OTHER)
Admission: RE | Admit: 2020-06-17 | Discharge: 2020-06-17 | Disposition: A | Discharge status: Home / Self Care | Payer: Medicare Other | Source: Ambulatory Visit | Attending: Family Medicine | Admitting: Family Medicine

## 2020-06-17 ENCOUNTER — Ambulatory Visit (INDEPENDENT_AMBULATORY_CARE_PROVIDER_SITE_OTHER): Payer: Medicare Other | Admitting: Family Medicine

## 2020-06-17 ENCOUNTER — Encounter: Payer: Self-pay | Admitting: Family Medicine

## 2020-06-17 VITALS — BP 110/80 | HR 74 | Temp 97.6°F | Ht 69.0 in | Wt 206.5 lb

## 2020-06-17 DIAGNOSIS — M19011 Primary osteoarthritis, right shoulder: Secondary | ICD-10-CM

## 2020-06-17 DIAGNOSIS — M1711 Unilateral primary osteoarthritis, right knee: Secondary | ICD-10-CM

## 2020-06-17 DIAGNOSIS — Z96642 Presence of left artificial hip joint: Secondary | ICD-10-CM | POA: Diagnosis not present

## 2020-06-17 LAB — PML/RAR TRANSLOCATION BY PCR

## 2020-06-17 NOTE — Patient Instructions (Signed)
Voltaren 1% gel. Over the counter You can apply up to 4 times a day Minimal is absorbed in the bloodstream Cost is about 9 dollars 

## 2020-06-17 NOTE — Progress Notes (Signed)
David Dyk T. Asyah Candler, MD, David [name]  Primary Care and [facility] at [facility] [place], [address]  Phone: [phone]   FAX: [phone]  David [name]. - 80 y.o. male   MRN [id number]   Date of Birth: 1941-02-05  Date: 06/17/2020   PCP: [name], MD   Referral: [name], MD  Chief Complaint  Patient presents with   Knee Pain    Right   Shoulder Pain    Right    This visit occurred during the SARS-CoV-2 public health emergency.  Safety protocols were in place, including screening questions prior to the visit, additional usage of staff PPE, and extensive cleaning of exam room while observing appropriate contact time as indicated for disinfecting solutions.   Subjective:   David [name]. is a 80 y.o. very pleasant male patient with Body mass index is 30.49 kg/m. who presents with the following:  Known R GH OA: He has some known glenohumeral osteoarthritis, and I pulled up his plain x-rays from 2019 again.  At that point, he did have significant glenohumeral arthritis and was having pain with the shoulder.  This point he does have intermittent indefinite shoulder pain.  He has some loss of motion.  He denies any numbness, tingling, or frank weakness.  On his August, 2021 PET scan, the patient does not show any hypermetabolic activity in either his shoulder or his knees.  About 2 weeks ago he did start to develop some knee pain, and this is predominantly medially, but he has not had any effusion, functional giving way, or locking up of the joint.  He is still functioning.  Additional history is provided by a caregiver.  At this point, he is not particularly active, but he can walk about 250 yards.  He has tried some Tylenol, and that has not helped all that much.  He does not have any kind of specific injury with onset of this pain.   Review of Systems is noted in the HPI, as  appropriate   Objective:   Temp 97.6 F (36.4 C) (Temporal)    Ht 5\' 9"  (1.753 m)    Wt 206 lb 8 oz (93.7 kg)    BMI 30.49 kg/m   GEN: No acute distress; alert,appropriate. PULM: Breathing comfortably in no respiratory distress PSYCH: Normally interactive.  Very pleasant.  Right shoulder: Patient has no significant tenderness along the clavicle or the AC joint.  Nontender in the bicipital groove.  He does have limitation in abduction and flexion to approximately 140 degrees and he has obvious lateral crepitus on examination.  He does have pain with terminal movements of all type of the shoulder.  His strength at the shoulder is 5/5 throughout. Neurovascularly he is intact throughout the entirety of the upper extremity as well. Additional provocative maneuvers of the shoulder are not done secondary to loss of motion and pain with terminal motion.  Right knee: No significant effusion.  Full extension and flexion to 125.  No significant medial or lateral joint line tenderness.  No significant tenderness with loading the medial lateral patellar facets.  Stable to varus and valgus stress.  Lachman as well as anterior and posterior drawer testing is entirely normal.  No significant pain with flexion pinch or with McMurray's.  Bounce home testing is normal.  Homans test is normal.  Strength at the knee and hip is 5/5 diffusely.  Radiology: X-rays: AP Bilateral Weight-bearing,  Weightbearing Lateral, Sunrise views, Lutricia Feil view Indication: knee pain Findings: Mild medial compartmental osteoarthritis.  He does have some posterior atherosclerosis.  On the bilateral AP, he has had a unicompartmental medial replacement on the left. The radiological images were independently reviewed by myself in the office and results were reviewed with the patient. My independent interpretation of images are above. Electronically Signed  By: Owens Loffler, MD On: 06/17/2020 12:00 PM EST   Assessment and Plan:      ICD-10-CM   1. Primary osteoarthritis of right knee  M17.11 DG Knee 4 Views W/Patella Right  2. Glenohumeral arthritis, right  M19.011    I reassured him that I do not think he has had any major knee injury.  His exam is essentially normal.  Fairly mild arthritis for age, and I encouraged him to try to be as active as he can long-term.  I do not think any specific intervention needs to be done at this point.  Clinically, his glenohumeral arthritis is worse and that he has very loud and tactile crepitus.  I think that this is going to be an issue off and on forever, and he already has a good sense of this.  I think some Tylenol is appropriate in this case.  Intermittent icing is also probably helpful.  Patient Instructions  Voltaren 1% gel. Over the counter You can apply up to 4 times a day Minimal is absorbed in the bloodstream Cost is about 9 dollars    Orders Placed This Encounter  Procedures   DG Knee 4 Views W/Patella Right    Follow-up: No follow-ups on file.  Signed,  Maud Deed. Aidel Davisson, MD   Outpatient Encounter Medications as of 06/17/2020  Medication Sig   acetaminophen (TYLENOL) 325 MG tablet Take 2 tablets (650 mg total) by mouth every 6 (six) hours as needed for mild pain (or Fever >/= 101).   allopurinol (ZYLOPRIM) 100 MG tablet Take 200 mg by mouth daily.   amLODipine (NORVASC) 2.5 MG tablet Take 1 tablet (2.5 mg total) by mouth daily. Do not take if dizzy and Systolic BP (top number) is less than 120.   aspirin EC 81 MG tablet Take 81 mg by mouth every evening.    Cholecalciferol (VITAMIN D3) 25 MCG (1000 UT) CAPS Take 1 capsule (1,000 Units total) by mouth daily.   colchicine 0.6 MG tablet Take 1 tablet (0.6 mg total) by mouth daily as needed (gout flare). May take 2 tablets on first day   Evolocumab (REPATHA SURECLICK) 694 MG/ML SOAJ Inject 1 pen into the skin every 14 (fourteen) days.   furosemide (LASIX) 20 MG tablet Take 1 tablet (20 mg total)  by mouth every other day.   insulin lispro (HUMALOG) 100 UNIT/ML KwikPen Use per sliding scale - 2 units for every 50 over 150   levothyroxine (SYNTHROID) 137 MCG tablet TAKE 1 TABLET BY MOUTH ONCE DAILY BEFORE BREAKFAST   meclizine (ANTIVERT) 12.5 MG tablet Take 1 tablet (12.5 mg total) by mouth 3 (three) times daily as needed for dizziness.   methylphenidate (RITALIN) 5 MG tablet TAKE 1 TABLET BY MOUTH TWICE DAILY AT BREAKFAST & LUNCH   metoprolol succinate (TOPROL-XL) 50 MG 24 hr tablet TAKE 1 AND 1/2 TABLET (75 MG) BY MOUTH EVERY DAY WITH OR IMMEDIATELY FOLLOWING A MEAL *DO NOT CRUSH*   sertraline (ZOLOFT) 50 MG tablet Take 1 tablet (50 mg total) by mouth daily. Take with breakfast.  Initiate 50mg  dose after completing 5 days of 25mg  .  testosterone cypionate (DEPOTESTOSTERONE CYPIONATE) 200 MG/ML injection INJECT 0.5ML (100MG  TOTAL) INTRAMUSCULARLY EVERY 14 DAYS (SINGLE DOSE VIAL) *ONLY GOOD FOR 1 DOSE* *CALL FOR REFILL*   traZODone (DESYREL) 50 MG tablet Take 0.5-1 tablets (25-50 mg total) by mouth at bedtime. (Patient taking differently: Take 25 mg by mouth at bedtime as needed for sleep.)   Turmeric 500 MG CAPS Take 2 capsules by mouth daily as needed (joint pain).   Facility-Administered Encounter Medications as of 06/17/2020  Medication   0.9 %  sodium chloride infusion

## 2020-06-29 ENCOUNTER — Ambulatory Visit (INDEPENDENT_AMBULATORY_CARE_PROVIDER_SITE_OTHER): Payer: Medicare Other

## 2020-06-29 DIAGNOSIS — I442 Atrioventricular block, complete: Secondary | ICD-10-CM

## 2020-06-29 LAB — CUP PACEART REMOTE DEVICE CHECK
Battery Remaining Longevity: 44 mo
Battery Remaining Percentage: 85 %
Battery Voltage: 2.95 V
Brady Statistic AP VP Percent: 99 %
Brady Statistic AP VS Percent: 1 %
Brady Statistic AS VP Percent: 1 %
Brady Statistic AS VS Percent: 1 %
Brady Statistic RA Percent Paced: 99 %
Date Time Interrogation Session: 20220322020010
Implantable Lead Implant Date: 19980107
Implantable Lead Implant Date: 19980107
Implantable Lead Implant Date: 20141118
Implantable Lead Location: 753858
Implantable Lead Location: 753859
Implantable Lead Location: 753860
Implantable Pulse Generator Implant Date: 20201223
Lead Channel Impedance Value: 1050 Ohm
Lead Channel Impedance Value: 380 Ohm
Lead Channel Impedance Value: 430 Ohm
Lead Channel Pacing Threshold Amplitude: 0.5 V
Lead Channel Pacing Threshold Amplitude: 1.125 V
Lead Channel Pacing Threshold Amplitude: 2.625 V
Lead Channel Pacing Threshold Pulse Width: 0.5 ms
Lead Channel Pacing Threshold Pulse Width: 0.5 ms
Lead Channel Pacing Threshold Pulse Width: 1 ms
Lead Channel Sensing Intrinsic Amplitude: 2.6 mV
Lead Channel Setting Pacing Amplitude: 2 V
Lead Channel Setting Pacing Amplitude: 2.125
Lead Channel Setting Pacing Amplitude: 4.625
Lead Channel Setting Pacing Pulse Width: 0.5 ms
Lead Channel Setting Pacing Pulse Width: 1 ms
Lead Channel Setting Sensing Sensitivity: 4 mV
Pulse Gen Model: 3562
Pulse Gen Serial Number: 6033885

## 2020-07-05 ENCOUNTER — Telehealth: Payer: Self-pay | Admitting: Family Medicine

## 2020-07-05 NOTE — Telephone Encounter (Signed)
Name of Medication: Testosterone cypionate Name of Pharmacy: [facility] or Written Date and Quantity: 05/10/20, #2 ml Last Office Visit and Type: 03/14/19, hosp f/u Next Office Visit and Type: none Last Controlled Substance Agreement Date: none Last UDS: none

## 2020-07-06 ENCOUNTER — Inpatient Hospital Stay (HOSPITAL_BASED_OUTPATIENT_CLINIC_OR_DEPARTMENT_OTHER): Payer: Medicare Other | Admitting: Hospice and Palliative Medicine

## 2020-07-06 DIAGNOSIS — C9241 Acute promyelocytic leukemia, in remission: Secondary | ICD-10-CM | POA: Diagnosis not present

## 2020-07-06 DIAGNOSIS — Z515 Encounter for palliative care: Secondary | ICD-10-CM | POA: Diagnosis not present

## 2020-07-06 NOTE — Telephone Encounter (Signed)
Also needs wellness and lab visits.

## 2020-07-06 NOTE — Telephone Encounter (Signed)
Patient is scheduled EM

## 2020-07-06 NOTE — Telephone Encounter (Signed)
ERx. plz call to schedule CPE as due - last seen 03/2019.

## 2020-07-06 NOTE — Progress Notes (Signed)
Virtual Visit via Telephone Note  I connected with David [name]. on 07/06/20 at  2:00 PM EDT by telephone and verified that I am speaking with the correct person using two identifiers.  Location: Patient: home Provider: clinic   I discussed the limitations, risks, security and privacy concerns of performing an evaluation and management service by telephone and the availability of in person appointments. I also discussed with the patient that there may be a patient responsible charge related to this service. The patient expressed understanding and agreed to proceed.   History of Present Illness: David Welchis a 80 y.o.malewith multiple medical problems including acute promyelocytic leukemia s/p arsenic and ATRA. 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 Patient was receiving arsenic for his APML but this was discontinued due to poor tolerance. Patient was referred to palliative care to help address goals and manage ongoing symptoms.   Observations/Objective: I called and spoke with patient by phone.  He reports that he is doing about the same.  He denies any significant changes or concerns today.  No symptomatic complaints at present.  He says that he did have recent knee pain evaluated by podiatry with x-ray suggesting degenerative changes.  He is applying a cream (?  Voltaren) which seems to be helping.  Patient reports stable oral intake.  He still fairly functional at home with assistance from caregivers if needed.  Patient did not report any recurrent vertigo today.  Assessment and Plan: APML -on best supportive care.  Seems to be doing reasonably well.  Unclear if he is still being actively followed by palliative care.  Will reconsult for them to follow chronically at home.  Follow Up Instructions: Follow-up visit with Dr. Grayland Ormond in 3  months   I discussed the assessment and treatment plan with the patient. The patient was provided an opportunity to ask questions and all were answered. The patient agreed with the plan and demonstrated an understanding of the instructions.   The patient was advised to call back or seek an in-person evaluation if the symptoms worsen or if the condition fails to improve as anticipated.  I provided 5 minutes of non-face-to-face time during this encounter.   Irean Hong, NP

## 2020-07-06 NOTE — Telephone Encounter (Signed)
Noted  

## 2020-07-07 ENCOUNTER — Telehealth: Payer: Self-pay

## 2020-07-07 DIAGNOSIS — F332 Major depressive disorder, recurrent severe without psychotic features: Secondary | ICD-10-CM | POA: Diagnosis not present

## 2020-07-07 DIAGNOSIS — F5105 Insomnia due to other mental disorder: Secondary | ICD-10-CM | POA: Diagnosis not present

## 2020-07-07 NOTE — Telephone Encounter (Signed)
Received new referral for Palliative care. Phone call placed to check in with patient and offer to schedule follow up visit with Amy NP.    VM left.

## 2020-07-07 NOTE — Progress Notes (Signed)
Remote pacemaker transmission.   

## 2020-08-02 ENCOUNTER — Other Ambulatory Visit: Payer: Self-pay

## 2020-08-02 ENCOUNTER — Ambulatory Visit (INDEPENDENT_AMBULATORY_CARE_PROVIDER_SITE_OTHER): Payer: Medicare Other | Admitting: Podiatry

## 2020-08-02 ENCOUNTER — Encounter: Payer: Self-pay | Admitting: Podiatry

## 2020-08-02 DIAGNOSIS — E119 Type 2 diabetes mellitus without complications: Secondary | ICD-10-CM

## 2020-08-02 DIAGNOSIS — M79675 Pain in left toe(s): Secondary | ICD-10-CM | POA: Diagnosis not present

## 2020-08-02 DIAGNOSIS — M79674 Pain in right toe(s): Secondary | ICD-10-CM | POA: Diagnosis not present

## 2020-08-02 DIAGNOSIS — B351 Tinea unguium: Secondary | ICD-10-CM

## 2020-08-02 NOTE — Progress Notes (Signed)
This patient returns to my office for at risk foot care.  This patient requires this care by a professional since this patient will be at risk due to having diabetes type 2.    This patient is unable to cut nails himself since the patient cannot reach his nails.These nails are painful walking and wearing shoes.  This patient presents for at risk foot care today.  General Appearance  Alert, conversant and in no acute stress.  Vascular  Dorsalis pedis and posterior tibial  pulses are palpable  bilaterally.  Capillary return is within normal limits  bilaterally. Temperature is within normal limits  bilaterally.  Neurologic  [name]-Weinstein monofilament wire test within normal limits  bilaterally. Muscle power within normal limits bilaterally.  Nails Thick disfigured discolored nails with subungual debris  Hallux nails  B/l.Marland Kitchen No evidence of bacterial infection or drainage bilaterally.  Orthopedic  No limitations of motion  feet .  No crepitus or effusions noted.  No bony pathology or digital deformities noted.  Skin  normotropic skin with no porokeratosis noted bilaterally.  No signs of infections or ulcers noted.     Onychomycosis  Pain in right toes  Pain in left toes  Consent was obtained for treatment procedures.   Mechanical debridement of nails 1-5  bilaterally performed with a nail nipper.  Filed with dremel without incident.    Return office visit  4 months                    Told patient to return for periodic foot care and evaluation due to potential at risk complications.   Gardiner Barefoot DPM

## 2020-08-04 NOTE — Progress Notes (Signed)
Patient on schedule for Port removal 4/29/*2022, called and spoke with patient on phone with instructions given. Made aware to be here @ 0930, NPO after Mn prior to procedure, and driver post procedure/recovery/discharge. Stated understanding.

## 2020-08-05 ENCOUNTER — Telehealth: Payer: Self-pay

## 2020-08-05 ENCOUNTER — Other Ambulatory Visit: Payer: Self-pay | Admitting: Radiology

## 2020-08-05 NOTE — Telephone Encounter (Signed)
Phone note 

## 2020-08-06 ENCOUNTER — Other Ambulatory Visit: Payer: Self-pay

## 2020-08-06 ENCOUNTER — Ambulatory Visit
Admission: RE | Admit: 2020-08-06 | Discharge: 2020-08-06 | Disposition: A | Discharge status: Home / Self Care | Payer: Medicare Other | Source: Ambulatory Visit | Attending: Oncology | Admitting: Oncology

## 2020-08-06 DIAGNOSIS — Z79890 Hormone replacement therapy (postmenopausal): Secondary | ICD-10-CM | POA: Insufficient documentation

## 2020-08-06 DIAGNOSIS — C799 Secondary malignant neoplasm of unspecified site: Secondary | ICD-10-CM | POA: Insufficient documentation

## 2020-08-06 DIAGNOSIS — Z452 Encounter for adjustment and management of vascular access device: Secondary | ICD-10-CM | POA: Diagnosis not present

## 2020-08-06 DIAGNOSIS — Z79899 Other long term (current) drug therapy: Secondary | ICD-10-CM | POA: Diagnosis not present

## 2020-08-06 DIAGNOSIS — E119 Type 2 diabetes mellitus without complications: Secondary | ICD-10-CM | POA: Diagnosis not present

## 2020-08-06 DIAGNOSIS — Z794 Long term (current) use of insulin: Secondary | ICD-10-CM | POA: Insufficient documentation

## 2020-08-06 DIAGNOSIS — Z87891 Personal history of nicotine dependence: Secondary | ICD-10-CM | POA: Diagnosis not present

## 2020-08-06 DIAGNOSIS — Z85820 Personal history of malignant melanoma of skin: Secondary | ICD-10-CM | POA: Diagnosis not present

## 2020-08-06 DIAGNOSIS — Z7982 Long term (current) use of aspirin: Secondary | ICD-10-CM | POA: Insufficient documentation

## 2020-08-06 DIAGNOSIS — C9241 Acute promyelocytic leukemia, in remission: Secondary | ICD-10-CM

## 2020-08-06 HISTORY — PX: IR REMOVAL TUN ACCESS W/ PORT W/O FL MOD SED: IMG2290

## 2020-08-06 MED ORDER — MIDAZOLAM HCL 2 MG/2ML IJ SOLN
INTRAMUSCULAR | Status: AC | PRN
  Administered 2020-08-06: 1 mg via INTRAVENOUS

## 2020-08-06 MED ORDER — FENTANYL CITRATE (PF) 100 MCG/2ML IJ SOLN
INTRAMUSCULAR | Status: AC | PRN
  Administered 2020-08-06: 50 ug via INTRAVENOUS

## 2020-08-06 MED ORDER — FENTANYL CITRATE (PF) 100 MCG/2ML IJ SOLN
INTRAMUSCULAR | Status: AC
  Filled 2020-08-06: qty 2

## 2020-08-06 MED ORDER — MIDAZOLAM HCL 2 MG/2ML IJ SOLN
INTRAMUSCULAR | Status: AC
  Filled 2020-08-06: qty 2

## 2020-08-06 MED ORDER — SODIUM CHLORIDE 0.9 % IV SOLN: INTRAVENOUS | Status: DC

## 2020-08-06 NOTE — Progress Notes (Signed)
Patient clinically stable post Port  Removal per Dr Dwaine Gale, tolerated well. Denies complaints at this time.awake/alert and oriented post procedure. Report given to Genelle Bal Rn,received Versed  1 mg along with Fentanyl 50 mcg IV for procedure.

## 2020-08-06 NOTE — H&P (Signed)
David Complaint: Completed treatment. Request is for portacath removal  Referring Physician(s): Finnegan,Timothy J  Supervising Physician: Mir, Sharen Heck  Patient Status: [facility] - Out-pt  History of Present Illness: David [name]. is a 80 y.o. male History of AV heart block s/p dual chamber pacemaker, CABG, DM, metastatic melanoma s/p left frontal brain mass excision and acute promyelocytic leukemia now in remission. Single lumen portacath placed at [place] dated unknown.Team is requesting a portacath removal no longer needed.   Past Medical History:  Diagnosis Date  . Anginal pain ([place])   . Arthritis    mostly hands  . Benign familial hematuria   . BPH (benign prostatic hypertrophy)   . Brain cancer ([place])    melanoma with brain met  . Coronary artery disease   . GERD (gastroesophageal reflux disease)   . High cholesterol   . Hypertension   . Hypothyroidism   . Intracerebral hemorrhage ([place])   . Melanoma ([place])   . Metastatic melanoma to lung ([place])   . Mood disorder ([place])   . Myocardial infarction ([place])   . Pacemaker    due to syncope, 3rd degree HB (upgrade to [facility]. Jude CRT-P 02/25/13 (Dr. Uvaldo Rising)  . Rectal bleed    due to NSAIDS  . Renal disorder   . Sepsis ([place]) 12/09/2018  . Skin cancer    melanoma  . Sleep apnea    uses CPAP  . Stroke [facility])     Past Surgical History:  Procedure Laterality Date  . APPLICATION OF CRANIAL NAVIGATION N/A 10/15/2015   Procedure: APPLICATION OF CRANIAL NAVIGATION;  Surgeon: Erline Levine, MD;  Location: [place] NEURO ORS;  Service: Neurosurgery;  Laterality: N/A;  . BIV PACEMAKER GENERATOR CHANGEOUT N/A 04/02/2019   Procedure: BIV PACEMAKER GENERATOR CHANGEOUT;  Surgeon: Evans Lance, MD;  Location: [place] CV LAB;  Service: Cardiovascular;  Laterality: N/A;  . CARDIAC CATHETERIZATION  2013  . CARDIAC SURGERY     bypass X 2  . COLONOSCOPY WITH PROPOFOL N/A 05/08/2016   diverticulosis, int hem, no f/u  needed [name])  . CORONARY ARTERY BYPASS GRAFT  2013   LIMA-LAD, SVG-PDA 03/27/11 (Dr. Francee Gentile)  . CRANIOTOMY N/A 10/15/2015   Procedure: LEFT FRONTAL CRANIOTOMY TUMOR EXCISION with Curve;  Surgeon: Erline Levine, MD;  Location: [place] NEURO ORS;  Service: Neurosurgery;  Laterality: N/A;  CRANIOTOMY TUMOR EXCISION  . EYE SURGERY    . FOOT SURGERY  08/2010  . JOINT REPLACEMENT Left    partial knee  . KNEE ARTHROSCOPY  12/2008  . LYMPH NODE BIOPSY    . PACEMAKER INSERTION    . TONSILLECTOMY      Allergies: Ezetimibe and Simvastatin  Medications: Prior to Admission medications   Medication Sig Start Date End Date Taking? Authorizing Provider  acetaminophen (TYLENOL) 325 MG tablet Take 2 tablets (650 mg total) by mouth every 6 (six) hours as needed for mild pain (or Fever >/= 101). 12/27/18   Angiulli, Lavon Paganini, PA-C  allopurinol (ZYLOPRIM) 100 MG tablet Take 200 mg by mouth daily. 12/02/19   [provider]  amLODipine (NORVASC) 2.5 MG tablet Take 1 tablet (2.5 mg total) by mouth daily. Do not take if dizzy and Systolic BP (top number) is less than 120. 02/27/20 05/27/20  Marrianne Mood D, PA-C  aspirin EC 81 MG tablet Take 81 mg by mouth every evening.     [provider]  B-D SYRINGE/NEEDLE 3CC/23GX1" 23G X 1" 3 ML MISC USE AS DIRECTED WITH  TESTOSTERONE INJECTION 07/06/20   [name], MD  Cholecalciferol (VITAMIN D3) 25 MCG (1000 UT) CAPS Take 1 capsule (1,000 Units total) by mouth daily. 12/27/18   Angiulli, Lavon Paganini, PA-C  colchicine 0.6 MG tablet Take 1 tablet (0.6 mg total) by mouth daily as needed (gout flare). May take 2 tablets on first day 05/31/20   [name], MD  Evolocumab [facility] SURECLICK) 258 MG/ML SOAJ Inject 1 pen into the skin every 14 (fourteen) days. 02/27/20   Marrianne Mood D, PA-C  furosemide (LASIX) 20 MG tablet Take 1 tablet (20 mg total) by mouth every other day. 05/31/20   [name], MD  insulin lispro (HUMALOG) 100 UNIT/ML  KwikPen Use per sliding scale - 2 units for every 50 over 150 05/31/20   [name], MD  levothyroxine (SYNTHROID) 137 MCG tablet TAKE 1 TABLET BY MOUTH ONCE DAILY BEFORE BREAKFAST 12/27/18   Angiulli, Lavon Paganini, PA-C  meclizine (ANTIVERT) 12.5 MG tablet Take 1 tablet (12.5 mg total) by mouth 3 (three) times daily as needed for dizziness. 01/13/20   Lesleigh Noe, MD  methylphenidate (RITALIN) 5 MG tablet TAKE 1 TABLET BY MOUTH TWICE DAILY AT BREAKFAST & LUNCH 06/03/20   Ventura Sellers, MD  metoprolol succinate (TOPROL-XL) 50 MG 24 hr tablet TAKE 1 AND 1/2 TABLET (75 MG) BY MOUTH EVERY DAY WITH OR IMMEDIATELY FOLLOWING A MEAL *DO NOT CRUSH* 12/27/18   Angiulli, Lavon Paganini, PA-C  sertraline (ZOLOFT) 50 MG tablet Take 1 tablet (50 mg total) by mouth daily. Take with breakfast.  Initiate 45m dose after completing 5 days of 252m. 06/11/19   FiLloyd HugerMD  testosterone cypionate (DEPOTESTOSTERONE CYPIONATE) 200 MG/ML injection Inject 0.5 mLs (100 mg total) into the muscle every 14 (fourteen) days. 07/06/20   GuRia BushMD  traZODone (DESYREL) 50 MG tablet Take 0.5-1 tablets (25-50 mg total) by mouth at bedtime. Patient taking differently: Take 25 mg by mouth at bedtime as needed for sleep. 10/14/18   GuRia BushMD  Turmeric 500 MG CAPS Take 2 capsules by mouth daily as needed (joint pain). 05/31/20   GuRia BushMD     Family History  Problem Relation Age of Onset  . Cancer Mother [id number]     lung   . Hypertension Mother   . Hypertension Father   . Heart attack Father   . Heart disease Father   . Rheum arthritis Sister   . Cancer Maternal Grandmother     Social History   Socioeconomic History  . Marital status: Married    Spouse name: Not on file  . Number of children: 2  . Years of education: Not on file  . Highest education level: Not on file  Occupational History  . Occupation: retired dePharmacist, communityTobacco Use  . Smoking status: Former Smoker    Packs/day:  1.00    Years: 3.00    Pack years: 3.00    Quit date: 04/10/1957    Years since quitting: 63.3  . Smokeless tobacco: Never Used  Vaping Use  . Vaping Use: Never used  Substance and Sexual Activity  . Alcohol use: Yes    Alcohol/week: 14.0 standard drinks    Types: 10 Glasses of wine, 4 Cans of beer per week  . Drug use: No  . Sexual activity: Not Currently  Other Topics Concern  . Not on file  Social History Narrative   Lives with wife ([place]th [place]osewith dementia in [place]  Grown children - daughter [name] and son    Occ: retired Pharmacist, community practiced in [place]   Has living will   [name] is health care [place]   Requests DNR-- done 09/14/15, declines tube feeds - verified 03/2019   Social Determinants of Health   Financial Resource Strain: Not on file  Food Insecurity: Not on file  Transportation Needs: Not on file  Physical Activity: Not on file  Stress: Not on file  Social Connections: Not on file     Review of Systems: A 12 point ROS discussed and pertinent positives are indicated in the HPI above.  All other systems are negative.  Review of Systems  Constitutional: Negative for fever.  HENT: Negative for congestion.   Respiratory: Negative for cough and shortness of breath.   Cardiovascular: Negative for chest pain.  Gastrointestinal: Negative for abdominal pain.  Neurological: Negative for headaches.  Psychiatric/Behavioral: Negative for behavioral problems and confusion.    Vital Signs: There were no vitals taken for this visit.  Physical Exam Vitals and nursing note reviewed.  Constitutional:      Appearance: He is well-developed.  HENT:     Head: Normocephalic.  Cardiovascular:     Rate and Rhythm: Normal rate and regular rhythm.     Heart sounds: Normal heart sounds.     Comments: Left sided cardiac device. RIJ single lumen portacath  Pulmonary:     Effort: Pulmonary effort is normal.     Breath sounds: Normal breath sounds.   Musculoskeletal:        General: Normal range of motion.     Cervical back: Normal range of motion.  Skin:    General: Skin is dry.  Neurological:     Mental Status: He is alert and oriented to person, place, and time.     Imaging: No results found.  Labs:  CBC: Recent Labs    11/28/19 1420 02/27/20 1242 03/01/20 1012 06/09/20 1047  WBC 5.0 4.7 4.5 5.1  HGB 15.1 14.4 14.3 15.6  HCT 43.7 42.9 42.4 47.5  PLT 152 192 158 143*    COAGS: No results for input(s): INR, APTT in the last 8760 hours.  BMP: Recent Labs    10/15/19 1105 11/24/19 0914 11/28/19 1420 02/27/20 1242 03/01/20 1012 06/09/20 1047  NA 139  --  136 140 140 139  K 4.5  --  4.9 5.0 4.8 5.0  CL 103  --  103 103 106 105  CO2 27  --  _0 GLUCOSE 117*  --  85 99 114* 129*  BUN 32* 41* 36* 33* 46* 39*  CALCIUM 9.4  --  9.1 9.3 9.3 9.0  CREATININE 1.91* 1.63* 1.72* 1.60* 1.73* 1.64*  GFRNONAA 32* 39* 37* 40* 39* 42*  GFRAA 38* 45* 43* 46*  --   --     LIVER FUNCTION TESTS: Recent Labs    10/15/19 1105 11/28/19 1420 03/01/20 1012 06/09/20 1047  BILITOT 0.6 0.6 0.6 0.7  AST _1 ALT _2 ALKPHOS 45 50 51 59  PROT 6.7 6.7 6.8 6.8  ALBUMIN 4.0 3.8 4.1 4.0    Assessment and Plan:  80 y.o. male outpatient. History of AV heart block s/p dual chamber pacemaker, CABG, DM, metastatic melanoma s/p left frontal brain mass excision and acute promyelocytic leukemia now in remission. Single lumen portacath placed at [place] dated unknown.Team is requesting a portacath removal no longer  needed.   PET from 8.18.21 shows a RIJ portacath and a left sided cardiac device.. No recent labs. No pertinent allergies. Patient is on 81 mg of ASA. Patient has been NPO since midnight  Risks and benefits of image guided port-a-catheter removal was discussed with the patient including, but not limited to bleeding, infection, pneumothorax, or fibrin sheath development and need  for additional procedures.  All of the patient's questions were answered, patient is agreeable to proceed. Consent signed and in chart.   Thank you for this interesting consult.  I greatly enjoyed meeting Josua Ferrebee. and look forward to participating in their care.  A copy of this report was sent to the requesting provider on this date.  Electronically Signed: Jacqualine Mau, NP 08/06/2020, 8:42 AM   I spent a total of  30 Minutes   in face to face in clinical consultation, greater than 50% of which was counseling/coordinating care for portacath removal.

## 2020-08-06 NOTE — Discharge Instructions (Signed)
Implanted Port Removal, Care After Refer to this sheet in the next few weeks. These instructions provide you with information about caring for yourself after your procedure. Your health care provider may also give you more specific instructions. Your treatment has been planned according to current medical practices, but problems sometimes occur. Call your health care provider if you have any problems or questions after your procedure. What can I expect after the procedure? After the procedure, it is common to have: Soreness or pain near your incision. Some swelling or bruising near your incision.  Follow these instructions at home: Medicines Take over-the-counter and prescription medicines only as told by your health care provider. If you were prescribed an antibiotic medicine, take it as told by your health care provider. Do not stop taking the antibiotic even if you start to feel better. Bathing You may shower tomorrow.  Incision care You have an incision with sutures that are dissolvable and skin glue over top incision.  This skin glue will wear off over time.  Do not peel or try to remove this yourself. Keep your dressing dry. Check your incision area every day for signs of infection and if present call your doctor and let them know. Check for: More redness, swelling, or pain. More fluid or blood. Warmth. Pus or a bad smell. Driving If you received a sedative, do not drive for 24 hours after the procedure. If you did not receive a sedative, ask your health care provider when it is safe to drive. Activity Return to your normal activities as told by your health care provider. Ask your health care provider what activities are safe for you. Until your health care provider says it is safe: Do not lift anything that is heavier than 10 lb (4.5 kg). Do not do activities that involve lifting your arms over your head. General instructions Do not use any tobacco products, such as cigarettes,  chewing tobacco, and e-cigarettes. Tobacco can delay healing. If you need help quitting, ask your health care provider. Keep all follow-up visits as told by your health care provider. This is important. Contact a health care provider if: You have more redness, swelling, or pain around your incision. You have more fluid or blood coming from your incision. Your incision feels warm to the touch. You have pus or a bad smell coming from your incision. You have a fever. You have pain that is not relieved by your pain medicine. Get help right away if: You have chest pain. You have difficulty breathing. This information is not intended to replace advice given to you by your health care provider. Make sure you discuss any questions you have with your health care provider. Document Released: 03/08/2015 Document Revised: 09/02/2015 Document Reviewed: 12/30/2014 Elsevier Interactive Patient Education  2018 Elsevier Inc.  

## 2020-08-06 NOTE — Procedures (Signed)
Interventional Radiology Procedure Note  Procedure: Chest port removal  Indication: Completion of therapy  Findings: Please refer to procedural dictation for full description.  Complications: None  EBL: < 10 mL  Miachel Roux, MD [phone]

## 2020-08-17 DIAGNOSIS — Z23 Encounter for immunization: Secondary | ICD-10-CM | POA: Diagnosis not present

## 2020-08-18 DIAGNOSIS — Z85820 Personal history of malignant melanoma of skin: Secondary | ICD-10-CM | POA: Diagnosis not present

## 2020-08-18 DIAGNOSIS — L570 Actinic keratosis: Secondary | ICD-10-CM | POA: Diagnosis not present

## 2020-08-18 DIAGNOSIS — L218 Other seborrheic dermatitis: Secondary | ICD-10-CM | POA: Diagnosis not present

## 2020-08-18 DIAGNOSIS — D1801 Hemangioma of skin and subcutaneous tissue: Secondary | ICD-10-CM | POA: Diagnosis not present

## 2020-08-18 DIAGNOSIS — L821 Other seborrheic keratosis: Secondary | ICD-10-CM | POA: Diagnosis not present

## 2020-08-18 DIAGNOSIS — Z85828 Personal history of other malignant neoplasm of skin: Secondary | ICD-10-CM | POA: Diagnosis not present

## 2020-08-18 DIAGNOSIS — D2272 Melanocytic nevi of left lower limb, including hip: Secondary | ICD-10-CM | POA: Diagnosis not present

## 2020-08-24 ENCOUNTER — Telehealth: Payer: Self-pay

## 2020-08-24 NOTE — Telephone Encounter (Signed)
Volunteer called patient on behalf of [facility]. Patient is doing well at this time.  

## 2020-08-25 ENCOUNTER — Ambulatory Visit (INDEPENDENT_AMBULATORY_CARE_PROVIDER_SITE_OTHER): Payer: Medicare Other

## 2020-08-25 ENCOUNTER — Other Ambulatory Visit: Payer: Self-pay

## 2020-08-25 DIAGNOSIS — Z0000 Encounter for general adult medical examination without abnormal findings: Secondary | ICD-10-CM

## 2020-08-25 NOTE — Patient Instructions (Signed)
David [name] , Thank you for taking time to come for your Medicare Wellness Visit. I appreciate your ongoing commitment to your health goals. Please review the following plan we discussed and let me know if I can assist you in the future.   Screening recommendations/referrals: Colonoscopy: no longer required  Recommended yearly ophthalmology/optometry visit for glaucoma screening and checkup Recommended yearly dental visit for hygiene and checkup  Vaccinations: Influenza vaccine: Up to date, completed 01/13/2020, due 11/2020 Pneumococcal vaccine: Completed series Tdap vaccine: decline-insurance  Shingles vaccine: Completed series   Covid-19:  completed 3 doses. Booster due. Will discuss with provider  Advanced directives: copy in chart  Conditions/risks identified: diabetes  Next appointment: Follow up in one year for your annual wellness visit.   Preventive Care 53 Years and Older, Male Preventive care refers to lifestyle choices and visits with your health care provider that can promote health and wellness. What does preventive care include?  A yearly physical exam. This is also called an annual well check.  Dental exams once or twice a year.  Routine eye exams. Ask your health care provider how often you should have your eyes checked.  Personal lifestyle choices, including:  Daily care of your teeth and gums.  Regular physical activity.  Eating a healthy diet.  Avoiding tobacco and drug use.  Limiting alcohol use.  Practicing safe sex.  Taking low doses of aspirin every day.  Taking vitamin and mineral supplements as recommended by your health care provider. What happens during an annual well check? The services and screenings done by your health care provider during your annual well check will depend on your age, overall health, lifestyle risk factors, and family history of disease. Counseling  Your health care provider may ask you questions about your:  Alcohol  use.  Tobacco use.  Drug use.  Emotional well-being.  Home and relationship well-being.  Sexual activity.  Eating habits.  History of falls.  Memory and ability to understand (cognition).  Work and work Statistician. Screening  You may have the following tests or measurements:  Height, weight, and BMI.  Blood pressure.  Lipid and cholesterol levels. These may be checked every 5 years, or more frequently if you are over 75 years old.  Skin check.  Lung cancer screening. You may have this screening every year starting at age 81 if you have a 30-pack-year history of smoking and currently smoke or have quit within the past 15 years.  Fecal occult blood test (FOBT) of the stool. You may have this test every year starting at age 81.  Flexible sigmoidoscopy or colonoscopy. You may have a sigmoidoscopy every 5 years or a colonoscopy every 10 years starting at age 81.  Prostate cancer screening. Recommendations will vary depending on your family history and other risks.  Hepatitis C blood test.  Hepatitis B blood test.  Sexually transmitted disease (STD) testing.  Diabetes screening. This is done by checking your blood sugar (glucose) after you have not eaten for a while (fasting). You may have this done every 1-3 years.  Abdominal aortic aneurysm (AAA) screening. You may need this if you are a current or former smoker.  Osteoporosis. You may be screened starting at age 81 if you are at high risk. Talk with your health care provider about your test results, treatment options, and if necessary, the need for more tests. Vaccines  Your health care provider may recommend certain vaccines, such as:  Influenza vaccine. This is recommended every year.  Tetanus, diphtheria, and acellular pertussis (Tdap, Td) vaccine. You may need a Td booster every 10 years.  Zoster vaccine. You may need this after age 31.  Pneumococcal 13-valent conjugate (PCV13) vaccine. One dose is  recommended after age 15.  Pneumococcal polysaccharide (PPSV23) vaccine. One dose is recommended after age 79. Talk to your health care provider about which screenings and vaccines you need and how often you need them. This information is not intended to replace advice given to you by your health care provider. Make sure you discuss any questions you have with your health care provider. Document Released: 04/23/2015 Document Revised: 12/15/2015 Document Reviewed: 01/26/2015 Elsevier Interactive Patient Education  2017 [place] Prevention in the Home Falls can cause injuries. They can happen to people of all ages. There are many things you can do to make your home safe and to help prevent falls. What can I do on the outside of my home?  Regularly fix the edges of walkways and driveways and fix any cracks.  Remove anything that might make you trip as you walk through a door, such as a raised step or threshold.  Trim any bushes or trees on the path to your home.  Use bright outdoor lighting.  Clear any walking paths of anything that might make someone trip, such as rocks or tools.  Regularly check to see if handrails are loose or broken. Make sure that both sides of any steps have handrails.  Any raised decks and porches should have guardrails on the edges.  Have any leaves, snow, or ice cleared regularly.  Use sand or salt on walking paths during winter.  Clean up any spills in your garage right away. This includes oil or grease spills. What can I do in the bathroom?  Use night lights.  Install grab bars by the toilet and in the tub and shower. Do not use towel bars as grab bars.  Use non-skid mats or decals in the tub or shower.  If you need to sit down in the shower, use a plastic, non-slip stool.  Keep the floor dry. Clean up any water that spills on the floor as soon as it happens.  Remove soap buildup in the tub or shower regularly.  Attach bath mats  securely with double-sided non-slip rug tape.  Do not have throw rugs and other things on the floor that can make you trip. What can I do in the bedroom?  Use night lights.  Make sure that you have a light by your bed that is easy to reach.  Do not use any sheets or blankets that are too big for your bed. They should not hang down onto the floor.  Have a firm chair that has side arms. You can use this for support while you get dressed.  Do not have throw rugs and other things on the floor that can make you trip. What can I do in the kitchen?  Clean up any spills right away.  Avoid walking on wet floors.  Keep items that you use a lot in easy-to-reach places.  If you need to reach something above you, use a strong step stool that has a grab bar.  Keep electrical cords out of the way.  Do not use floor polish or wax that makes floors slippery. If you must use wax, use non-skid floor wax.  Do not have throw rugs and other things on the floor that can make you trip. What can I do  with my stairs?  Do not leave any items on the stairs.  Make sure that there are handrails on both sides of the stairs and use them. Fix handrails that are broken or loose. Make sure that handrails are as long as the stairways.  Check any carpeting to make sure that it is firmly attached to the stairs. Fix any carpet that is loose or worn.  Avoid having throw rugs at the top or bottom of the stairs. If you do have throw rugs, attach them to the floor with carpet tape.  Make sure that you have a light switch at the top of the stairs and the bottom of the stairs. If you do not have them, ask someone to add them for you. What else can I do to help prevent falls?  Wear shoes that:  Do not have high heels.  Have rubber bottoms.  Are comfortable and fit you well.  Are closed at the toe. Do not wear sandals.  If you use a stepladder:  Make sure that it is fully opened. Do not climb a closed  stepladder.  Make sure that both sides of the stepladder are locked into place.  Ask someone to hold it for you, if possible.  Clearly mark and make sure that you can see:  Any grab bars or handrails.  First and last steps.  Where the edge of each step is.  Use tools that help you move around (mobility aids) if they are needed. These include:  Canes.  Walkers.  Scooters.  Crutches.  Turn on the lights when you go into a dark area. Replace any light bulbs as soon as they burn out.  Set up your furniture so you have a clear path. Avoid moving your furniture around.  If any of your floors are uneven, fix them.  If there are any pets around you, be aware of where they are.  Review your medicines with your doctor. Some medicines can make you feel dizzy. This can increase your chance of falling. Ask your doctor what other things that you can do to help prevent falls. This information is not intended to replace advice given to you by your health care provider. Make sure you discuss any questions you have with your health care provider. Document Released: 01/21/2009 Document Revised: 09/02/2015 Document Reviewed: 05/01/2014 Elsevier Interactive Patient Education  2017 [employer].

## 2020-08-25 NOTE — Progress Notes (Signed)
PCP notes:  Health Maintenance: Eye exam- due covid booster- due   Abnormal Screenings: none   Patient concerns: none   Nurse concerns: none   Next PCP appt.: 08/31/2020 @ 2 pm

## 2020-08-25 NOTE — Progress Notes (Signed)
Subjective:   David Vallee. is a 80 y.o. male who presents for Medicare Annual/Subsequent preventive examination.  Review of Systems: N/A      I connected with the patient today by telephone and verified that I am speaking with the correct person using two identifiers. Location patient: home Location nurse: work Persons participating in the telephone visit: patient, nurse.   I discussed the limitations, risks, security and privacy concerns of performing an evaluation and management service by telephone and the availability of in person appointments. I also discussed with the patient that there may be a patient responsible charge related to this service. The patient expressed understanding and verbally consented to this telephonic visit.        Cardiac Risk Factors include: advanced age (>15mn, >>21women);male gender;diabetes mellitus     Objective:    Today's Vitals   08/25/20 1352  PainSc: 0-No pain   There is no height or weight on file to calculate BMI.  Advanced Directives 08/25/2020 06/09/2020 03/01/2020 11/25/2019 09/25/2019 09/19/2019 07/15/2019  Does Patient Have a Medical Advance Directive? Yes No Yes Yes Yes Yes Yes  Type of AParamedicof AButte des MortsLiving will H[place]agevilleLiving will H[place]backvilleLiving will HHustlerLiving will Living will;Healthcare Power of Attorney Living will;Healthcare Power of Attorney Living will;Healthcare Power of Attorney  Does patient want to make changes to medical advance directive? - - Yes (ED - Information included in AVS) - No - Patient declined No - Patient declined No - Patient declined  Copy of [place] Chart? Yes - validated most recent copy scanned in chart (See row information) - - No - copy requested No - copy requested No - copy requested No - copy requested    Current Medications (verified) Outpatient Encounter Medications as  of 08/25/2020  Medication Sig  . acetaminophen (TYLENOL) 325 MG tablet Take 2 tablets (650 mg total) by mouth every 6 (six) hours as needed for mild pain (or Fever >/= 101).  .Marland Kitchenallopurinol (ZYLOPRIM) 100 MG tablet Take 200 mg by mouth daily.  .Marland Kitchenaspirin EC 81 MG tablet Take 81 mg by mouth every evening.   . B-D SYRINGE/NEEDLE 3CC/23GX1" 23G X 1" 3 ML MISC USE AS DIRECTED WITH TESTOSTERONE INJECTION  . Cholecalciferol (VITAMIN D3) 25 MCG (1000 UT) CAPS Take 1 capsule (1,000 Units total) by mouth daily.  . colchicine 0.6 MG tablet Take 1 tablet (0.6 mg total) by mouth daily as needed (gout flare). May take 2 tablets on first day  . Evolocumab (REPATHA SURECLICK) 1924MG/ML SOAJ Inject 1 pen into the skin every 14 (fourteen) days.  . furosemide (LASIX) 20 MG tablet Take 1 tablet (20 mg total) by mouth every other day.  . insulin lispro (HUMALOG) 100 UNIT/ML KwikPen Use per sliding scale - 2 units for every 50 over 150  . levothyroxine (SYNTHROID) 137 MCG tablet TAKE 1 TABLET BY MOUTH ONCE DAILY BEFORE BREAKFAST  . meclizine (ANTIVERT) 12.5 MG tablet Take 1 tablet (12.5 mg total) by mouth 3 (three) times daily as needed for dizziness.  . methylphenidate (RITALIN) 5 MG tablet TAKE 1 TABLET BY MOUTH TWICE DAILY AT BREAKFAST & LUNCH  . metoprolol succinate (TOPROL-XL) 50 MG 24 hr tablet TAKE 1 AND 1/2 TABLET (75 MG) BY MOUTH EVERY DAY WITH OR IMMEDIATELY FOLLOWING A MEAL *DO NOT CRUSH*  . sertraline (ZOLOFT) 50 MG tablet Take 1 tablet (50 mg total) by mouth daily.  Take with breakfast.  Initiate 27m dose after completing 5 days of 287m.  . testosterone cypionate (DEPOTESTOSTERONE CYPIONATE) 200 MG/ML injection Inject 0.5 mLs (100 mg total) into the muscle every 14 (fourteen) days.  . traZODone (DESYREL) 50 MG tablet Take 0.5-1 tablets (25-50 mg total) by mouth at bedtime. (Patient taking differently: Take 25 mg by mouth at bedtime as needed for sleep.)  . Turmeric 500 MG CAPS Take 2 capsules by mouth daily  as needed (joint pain).  . Marland KitchenmLODipine (NORVASC) 2.5 MG tablet Take 1 tablet (2.5 mg total) by mouth daily. Do not take if dizzy and Systolic BP (top number) is less than 120.   Facility-Administered Encounter Medications as of 08/25/2020  Medication  . 0.9 %  sodium chloride infusion    Allergies (verified) Ezetimibe and Simvastatin   History: Past Medical History:  Diagnosis Date  . Anginal pain (HCHartington  . Arthritis    mostly hands  . Benign familial hematuria   . BPH (benign prostatic hypertrophy)   . Brain cancer (HCSimonton Lake   melanoma with brain met  . Coronary artery disease   . GERD (gastroesophageal reflux disease)   . High cholesterol   . Hypertension   . Hypothyroidism   . Intracerebral hemorrhage (HCThe [place]  . Melanoma (HCLa [place]  . Metastatic melanoma to lung (HCSummit  . Mood disorder (HCSt. Benjie  . Myocardial infarction (HCUnion[place]  . Pacemaker    due to syncope, 3rd degree HB ([facility] 02/25/13 (Dr. BoUvaldo Rising . Rectal bleed    due to NSAIDS  . Renal disorder   . Sepsis (HCEvanston08/31/2020  . Skin cancer    melanoma  . Sleep apnea    uses CPAP  . [facility] ([facility]   Past Surgical History:  Procedure Laterality Date  . APPLICATION OF CRANIAL NAVIGATION N/A 10/15/2015   Procedure: APPLICATION OF CRANIAL NAVIGATION;  Surgeon: JoErline LevineMD;  [facility];  Service: Neurosurgery;  Laterality: N/A;  . BIV PACEMAKER GENERATOR CHANGEOUT N/A 04/02/2019   Procedure: BIV PACEMAKER GENERATOR CHANGEOUT;  Surgeon: TaEvans LanceMD;  Location: [facility];  Service: Cardiovascular;  Laterality: N/A;  . CARDIAC CATHETERIZATION  2013  . CARDIAC SURGERY     bypass X 2  . COLONOSCOPY WITH PROPOFOL N/A 05/08/2016   diverticulosis, int hem, no f/u needed (AVicente Males . CORONARY ARTERY BYPASS GRAFT  2013   LIMA-LAD, SVG-PDA 03/27/11 (Dr. KrFrancee Gentile . CRANIOTOMY N/A 10/15/2015   Procedure: LEFT FRONTAL CRANIOTOMY TUMOR EXCISION with Curve;  Surgeon: JoErline LevineMD;   [facility];  Service: Neurosurgery;  Laterality: N/A;  CRANIOTOMY TUMOR EXCISION  . EYE SURGERY    . FOOT SURGERY  08/2010  . IR REMOVAL TUN ACCESS W/ PORT W/O FL MOD SED  08/06/2020  . JOINT REPLACEMENT Left    partial knee  . KNEE ARTHROSCOPY  12/2008  . LYMPH NODE BIOPSY    . PACEMAKER INSERTION    . TONSILLECTOMY     Family History  Problem Relation Age of Onset  . Cancer Mother [id number]     lung   . Hypertension Mother   . Hypertension Father   . Heart attack Father   . Heart disease Father   . Rheum arthritis Sister   . Cancer Maternal Grandmother    Social History   Socioeconomic History  . Marital status: Married    Spouse name: Not  on file  . Number of children: 2  . Years of education: Not on file  . Highest education level: Not on file  Occupational History  . Occupation: retired Pharmacist, community  Tobacco Use  . Smoking status: Former Smoker    Packs/day: 1.00    Years: 3.00    Pack years: 3.00    Quit date: 04/10/1957    Years since quitting: 63.4  . Smokeless tobacco: Never Used  Vaping Use  . Vaping Use: Never used  Substance and Sexual Activity  . Alcohol use: Yes    Comment: maybe 2 beers twice a month   . Drug use: No  . Sexual activity: Not Currently  Other Topics Concern  . Not on file  Social History Narrative   Lives with wife [name]) with dementia in [place] children - daughter [name] and son    Occ: retired Pharmacist, community practiced in [place]   Has living will   [name] is health care [place]   Requests DNR-- done 09/14/15, declines tube feeds - verified 03/2019   Social Determinants of Health   Financial Resource Strain: Low Risk   . Difficulty of Paying Living Expenses: Not hard at all  Food Insecurity: No Food Insecurity  . Worried About Charity fundraiser in the Last Year: Never true  . Ran Out of Food in the Last Year: Never true  Transportation Needs: No Transportation Needs  . Lack of Transportation  (Medical): No  . Lack of Transportation (Non-Medical): No  Physical Activity: Inactive  . Days of Exercise per Week: 0 days  . Minutes of Exercise per Session: 0 min  Stress: No Stress Concern Present  . Feeling of Stress : Not at all  Social Connections: Not on file    Tobacco Counseling Counseling given: Not Answered   Clinical Intake:  Pre-visit preparation completed: Yes  Pain : No/denies pain Pain Score: 0-No pain     Nutritional Risks: None Diabetes: Yes CBG done?: No Did pt. bring in CBG monitor from home?: No  How often do you need to have someone help you when you read instructions, pamphlets, or other written materials from your doctor or pharmacy?: 1 - Never  Diabetic: Yes Nutrition Risk Assessment:  Has the patient had any N/V/D within the last 2 months?  No  Does the patient have any non-healing wounds?  No  Has the patient had any unintentional weight loss or weight gain?  No   Diabetes:  Is the patient diabetic?  Yes  If diabetic, was a CBG obtained today?  No telephone visit  Did the patient bring in their glucometer from home?  No  telephone visit  How often do you monitor your CBG's: when needed.   Financial Strains and Diabetes Management:  Are you having any financial strains with the device, your supplies or your medication? No .  Does the patient want to be seen by Chronic Care Management for management of their diabetes?  No  Would the patient like to be referred to a Nutritionist or for Diabetic Management?  No   Diabetic Exams:  Diabetic Eye Exam: Overdue for diabetic eye exam. Pt has been advised about the importance in completing this exam. Patient advised to call and schedule an eye exam. Diabetic Foot Exam: Completed 03/29/2020   Interpreter Needed?: No  Information entered by :: CJohnson, LPN   Activities of Daily Living In your present state of health, do you have  any difficulty performing the following activities: 08/25/2020  08/06/2020  Hearing? N N  Vision? N N  Difficulty concentrating or making decisions? Y N  Comment had brain sugery years ago per patient -  Walking or climbing stairs? N N  Dressing or bathing? N N  Doing errands, shopping? N -  Preparing Food and eating ? N -  Using the Toilet? N -  In the past six months, have you accidently leaked urine? N -  Do you have problems with loss of bowel control? N -  Managing your Medications? N -  Managing your Finances? N -  Housekeeping or managing your Housekeeping? N -  Some recent data might be hidden    Patient Care Team: [name], MD as PCP - General (Family Medicine) Minna Merritts, MD as PCP - Cardiology (Cardiology) Deboraha Sprang, MD as PCP - Electrophysiology (Cardiology) Cira Rue, RN Nurse Navigator as Registered Nurse ([place]) Acquanetta Chain, DO as Consulting Physician [facility] and Palliative Medicine) [name], MD as Consulting Physician (Hematology and Oncology)  Indicate any recent Medical Services you may have received from other than [facility] providers in the past year (date may be approximate).     Assessment:   This is a routine wellness examination for [place].  Hearing/Vision screen  Hearing Screening   125Hz  250Hz  500Hz  1000Hz  2000Hz  3000Hz  4000Hz  6000Hz  8000Hz   Right ear:           Left ear:           Vision Screening Comments: Advised patient to get annual eye exams   Dietary issues and exercise activities discussed: Current Exercise Habits: The patient does not participate in regular exercise at present, Exercise limited by: None identified  Goals Addressed            This Visit's Progress   . Patient Stated       08/25/2020, I will maintain and continue medications as prescribed.       Depression Screen PHQ 2/9 Scores 08/25/2020 02/13/2018 12/21/2017 10/16/2016 07/24/2016 01/19/2016 09/23/2015  PHQ - 2 Score 0 4 1 0 1 0 0  PHQ- 9 Score 0 8 3 - - - 0    Fall Risk Fall  Risk  08/25/2020 05/12/2019 12/21/2017 10/16/2016 07/24/2016  Falls in the past year? 0 0 No No No  Number falls in past yr: 0 - - - -  Injury with Fall? 0 - - - -  Risk for fall due to : Impaired balance/gait;Medication side effect - - Impaired mobility;Impaired balance/gait -  Follow up Falls evaluation completed;Falls prevention discussed - - - -    FALL RISK PREVENTION PERTAINING TO THE HOME:  Any stairs in or around the home? Yes  If so, are there any without handrails? No  Home free of loose throw rugs in walkways, pet beds, electrical cords, etc? Yes  Adequate lighting in your home to reduce risk of falls? Yes   ASSISTIVE DEVICES UTILIZED TO PREVENT FALLS:  Life alert? No  Use of a cane, walker or w/c? Yes  Grab bars in the bathroom? No  Shower chair or bench in shower? No  Elevated toilet seat or a handicapped toilet? No   TIMED UP AND GO:  Was the test performed? N/A telephone visit .   Cognitive Function: MMSE - Mini Mental State Exam 08/25/2020 12/21/2017  Orientation to time 5 5  Orientation to Place 5 5  Registration 3 3  Attention/ Calculation 5 0  Recall 3 2  Recall-comments - unable to recall 1 of 3 words  Language- name 2 objects - 0  Language- repeat 1 1  Language- follow 3 step command - 3  Language- read & follow direction - 0  Write a sentence - 0  Copy design - 0  Total score - 19  Mini Cog  Mini-Cog screen was completed. Maximum score is 22. A value of 0 denotes this part of the MMSE was not completed or the patient failed this part of the Mini-Cog screening.       Immunizations Immunization History  Administered Date(s) Administered  . DTaP 08/07/2012  . Fluad Quad(high Dose 65+) 12/19/2018, 01/13/2020  . Influenza, High Dose Seasonal PF 01/30/2017, 12/24/2018  . Influenza,inj,Quad PF,6+ Mos 12/24/2017, 12/31/2017  . Moderna Sars-Covid-2 Vaccination 04/24/2019, 05/22/2019, 12/08/2019  . Pneumococcal Conjugate-13 10/28/2013, 05/10/2016  .  Pneumococcal Polysaccharide-23 08/07/2012  . Zoster Recombinat (Shingrix) 01/25/2018, 03/20/2018    TDAP status: Due, Education has been provided regarding the importance of this vaccine. Advised may receive this vaccine at local pharmacy or Health Dept. Aware to provide a copy of the vaccination record if obtained from local pharmacy or Health Dept. Verbalized acceptance and understanding.  Flu Vaccine status: Up to date  Pneumococcal vaccine status: Up to date  Covid-19 vaccine status: completed 3 doses. Booster due. Will discuss with provider  Qualifies for Shingles Vaccine? Yes   Zostavax completed No   Shingrix Completed?: Yes  Screening Tests Health Maintenance  Topic Date Due  . OPHTHALMOLOGY EXAM  Never done  . URINE MICROALBUMIN  Never done  . COVID-19 Vaccine (4 - Booster for Moderna series) 03/09/2020  . HEMOGLOBIN A1C  04/16/2020  . TETANUS/TDAP  08/08/2022 (Originally 06/28/1959)  . FOOT EXAM  03/29/2021  . PNA vac Low Risk Adult  Completed  . HPV VACCINES  Aged Out    Health Maintenance  Health Maintenance Due  Topic Date Due  . OPHTHALMOLOGY EXAM  Never done  . URINE MICROALBUMIN  Never done  . COVID-19 Vaccine (4 - Booster for Moderna series) 03/09/2020  . HEMOGLOBIN A1C  04/16/2020    Colorectal cancer screening: No longer required.   Lung Cancer Screening: (Low Dose CT Chest recommended if Age 28-80 years, 30 pack-year currently smoking OR have quit w/in 15years.) does not qualify.   Additional Screening:  Hepatitis C Screening: does not qualify; Completed N/A  Vision Screening: Recommended annual ophthalmology exams for early detection of glaucoma and other disorders of the eye. Is the patient up to date with their annual eye exam?  No  Who is the provider or what is the name of the office in which the patient attends annual eye exams? Does not see eye doctor currently. If pt is not established with a provider, would they like to be referred to a  provider to establish care? No .   Dental Screening: Recommended annual dental exams for proper oral hygiene  Community Resource Referral / Chronic Care Management: CRR required this visit?  No   CCM required this visit?  No      Plan:     I have personally reviewed and noted the following in the patient's chart:   . Medical and social history . Use of alcohol, tobacco or illicit drugs  . Current medications and supplements including opioid prescriptions. Patient is not currently taking opioid prescriptions. . Functional ability and status . Nutritional status . Physical activity . Advanced directives . List of other physicians .  Hospitalizations, surgeries, and ER visits in previous 12 months . Vitals . Screenings to include cognitive, depression, and falls . Referrals and appointments  In addition, I have reviewed and discussed with patient certain preventive protocols, quality metrics, and best practice recommendations. A written personalized care plan for preventive services as well as general preventive health recommendations were provided to patient.   Due to this being a telephonic visit, the after visit summary with patients personalized plan was offered to patient via office or my-chart. Patient preferred to pick up at office at next visit or via mychart.   Andrez Grime, LPN   [id number]/[id number]

## 2020-08-30 ENCOUNTER — Ambulatory Visit: Payer: Medicare Other | Admitting: Cardiovascular Disease

## 2020-08-31 ENCOUNTER — Encounter: Payer: Self-pay | Admitting: Family Medicine

## 2020-08-31 ENCOUNTER — Ambulatory Visit (INDEPENDENT_AMBULATORY_CARE_PROVIDER_SITE_OTHER): Payer: Medicare Other | Admitting: Family Medicine

## 2020-08-31 ENCOUNTER — Other Ambulatory Visit: Payer: Self-pay

## 2020-08-31 VITALS — BP 118/80 | HR 75 | Temp 97.7°F | Ht 67.75 in | Wt 203.5 lb

## 2020-08-31 DIAGNOSIS — C7931 Secondary malignant neoplasm of brain: Secondary | ICD-10-CM

## 2020-08-31 DIAGNOSIS — S069X9S Unspecified intracranial injury with loss of consciousness of unspecified duration, sequela: Secondary | ICD-10-CM

## 2020-08-31 DIAGNOSIS — E291 Testicular hypofunction: Secondary | ICD-10-CM

## 2020-08-31 DIAGNOSIS — G4700 Insomnia, unspecified: Secondary | ICD-10-CM

## 2020-08-31 DIAGNOSIS — C61 Malignant neoplasm of prostate: Secondary | ICD-10-CM

## 2020-08-31 DIAGNOSIS — R739 Hyperglycemia, unspecified: Secondary | ICD-10-CM

## 2020-08-31 DIAGNOSIS — G4733 Obstructive sleep apnea (adult) (pediatric): Secondary | ICD-10-CM

## 2020-08-31 DIAGNOSIS — Z950 Presence of cardiac pacemaker: Secondary | ICD-10-CM

## 2020-08-31 DIAGNOSIS — Z7189 Other specified counseling: Secondary | ICD-10-CM

## 2020-08-31 DIAGNOSIS — M8949 Other hypertrophic osteoarthropathy, multiple sites: Secondary | ICD-10-CM

## 2020-08-31 DIAGNOSIS — E669 Obesity, unspecified: Secondary | ICD-10-CM

## 2020-08-31 DIAGNOSIS — E039 Hypothyroidism, unspecified: Secondary | ICD-10-CM

## 2020-08-31 DIAGNOSIS — S069XAS Unspecified intracranial injury with loss of consciousness status unknown, sequela: Secondary | ICD-10-CM

## 2020-08-31 DIAGNOSIS — Z9989 Dependence on other enabling machines and devices: Secondary | ICD-10-CM

## 2020-08-31 DIAGNOSIS — I442 Atrioventricular block, complete: Secondary | ICD-10-CM

## 2020-08-31 DIAGNOSIS — M1A09X0 Idiopathic chronic gout, multiple sites, without tophus (tophi): Secondary | ICD-10-CM

## 2020-08-31 DIAGNOSIS — M159 Polyosteoarthritis, unspecified: Secondary | ICD-10-CM

## 2020-08-31 DIAGNOSIS — T380X5A Adverse effect of glucocorticoids and synthetic analogues, initial encounter: Secondary | ICD-10-CM

## 2020-08-31 DIAGNOSIS — E1165 Type 2 diabetes mellitus with hyperglycemia: Secondary | ICD-10-CM

## 2020-08-31 DIAGNOSIS — Z7289 Other problems related to lifestyle: Secondary | ICD-10-CM

## 2020-08-31 DIAGNOSIS — C7800 Secondary malignant neoplasm of unspecified lung: Secondary | ICD-10-CM

## 2020-08-31 DIAGNOSIS — Z95810 Presence of automatic (implantable) cardiac defibrillator: Secondary | ICD-10-CM

## 2020-08-31 DIAGNOSIS — N1832 Chronic kidney disease, stage 3b: Secondary | ICD-10-CM

## 2020-08-31 DIAGNOSIS — I10 Essential (primary) hypertension: Secondary | ICD-10-CM

## 2020-08-31 DIAGNOSIS — E785 Hyperlipidemia, unspecified: Secondary | ICD-10-CM

## 2020-08-31 DIAGNOSIS — I25118 Atherosclerotic heart disease of native coronary artery with other forms of angina pectoris: Secondary | ICD-10-CM

## 2020-08-31 DIAGNOSIS — F331 Major depressive disorder, recurrent, moderate: Secondary | ICD-10-CM | POA: Diagnosis not present

## 2020-08-31 DIAGNOSIS — C9241 Acute promyelocytic leukemia, in remission: Secondary | ICD-10-CM

## 2020-08-31 DIAGNOSIS — E559 Vitamin D deficiency, unspecified: Secondary | ICD-10-CM

## 2020-08-31 DIAGNOSIS — G3189 Other specified degenerative diseases of nervous system: Secondary | ICD-10-CM

## 2020-08-31 DIAGNOSIS — C439 Malignant melanoma of skin, unspecified: Secondary | ICD-10-CM

## 2020-08-31 DIAGNOSIS — F09 Unspecified mental disorder due to known physiological condition: Secondary | ICD-10-CM

## 2020-08-31 DIAGNOSIS — Z789 Other specified health status: Secondary | ICD-10-CM

## 2020-08-31 LAB — POCT GLYCOSYLATED HEMOGLOBIN (HGB A1C): Hemoglobin A1C: 5.4 % (ref 4.0–5.6)

## 2020-08-31 NOTE — Patient Instructions (Addendum)
Schedule lab visit next Monday morning as early as you can and we will check all labs at that time. You A1c was in normal range! Stay as active as able.  We will call [facility] to check on most recent vaccine. Bring [place] a copy of your advanced directives/living will forms to update your chart.  Good to see you today Return as needed or in 1 year for next wellness visit.   Health Maintenance After Age 81 After age 53, you are at a higher risk for certain long-term diseases and infections as well as injuries from falls. Falls are a major cause of broken bones and head injuries in people who are older than age 55. Getting regular preventive care can help to keep you healthy and well. Preventive care includes getting regular testing and making lifestyle changes as recommended by your health care provider. Talk with your health care provider about:  Which screenings and tests you should have. A screening is a test that checks for a disease when you have no symptoms.  A diet and exercise plan that is right for you. What should I know about screenings and tests to prevent falls? Screening and testing are the best ways to find a health problem early. Early diagnosis and treatment give you the best chance of managing medical conditions that are common after age 43. Certain conditions and lifestyle choices may make you more likely to have a fall. Your health care provider may recommend:  Regular vision checks. Poor vision and conditions such as cataracts can make you more likely to have a fall. If you wear glasses, make sure to get your prescription updated if your vision changes.  Medicine review. Work with your health care provider to regularly review all of the medicines you are taking, including over-the-counter medicines. Ask your health care provider about any side effects that may make you more likely to have a fall. Tell your health care provider if any medicines that you take make you feel dizzy or  sleepy.  Osteoporosis screening. Osteoporosis is a condition that causes the bones to get weaker. This can make the bones weak and cause them to break more easily.  Blood pressure screening. Blood pressure changes and medicines to control blood pressure can make you feel dizzy.  Strength and balance checks. Your health care provider may recommend certain tests to check your strength and balance while standing, walking, or changing positions.  Foot health exam. Foot pain and numbness, as well as not wearing proper footwear, can make you more likely to have a fall.  Depression screening. You may be more likely to have a fall if you have a fear of falling, feel emotionally low, or feel unable to do activities that you used to do.  Alcohol use screening. Using too much alcohol can affect your balance and may make you more likely to have a fall. What actions can I take to lower my risk of falls? General instructions  Talk with your health care provider about your risks for falling. Tell your health care provider if: ? You fall. Be sure to tell your health care provider about all falls, even ones that seem minor. ? You feel dizzy, sleepy, or off-balance.  Take over-the-counter and prescription medicines only as told by your health care provider. These include any supplements.  Eat a healthy diet and maintain a healthy weight. A healthy diet includes low-fat dairy products, low-fat (lean) meats, and fiber from whole grains, beans, and lots of  fruits and vegetables. Home safety  Remove any tripping hazards, such as rugs, cords, and clutter.  Install safety equipment such as grab bars in bathrooms and safety rails on stairs.  Keep rooms and walkways well-lit. Activity  Follow a regular exercise program to stay fit. This will help you maintain your balance. Ask your health care provider what types of exercise are appropriate for you.  If you need a cane or walker, use it as recommended by  your health care provider.  Wear supportive shoes that have nonskid soles.   Lifestyle  Do not drink alcohol if your health care provider tells you not to drink.  If you drink alcohol, limit how much you have: ? 0-1 drink a day for women. ? 0-2 drinks a day for men.  Be aware of how much alcohol is in your drink. In the [place] one drink equals one typical bottle of beer (12 oz), one-half glass of wine (5 oz), or one shot of hard liquor (1 oz).  Do not use any products that contain nicotine or tobacco, such as cigarettes and e-cigarettes. If you need help quitting, ask your health care provider. Summary  Having a healthy lifestyle and getting preventive care can help to protect your health and wellness after age 73.  Screening and testing are the best way to find a health problem early and help you avoid having a fall. Early diagnosis and treatment give you the best chance for managing medical conditions that are more common for people who are older than age 66.  Falls are a major cause of broken bones and head injuries in people who are older than age 64. Take precautions to prevent a fall at home.  Work with your health care provider to learn what changes you can make to improve your health and wellness and to prevent falls. This information is not intended to replace advice given to you by your health care provider. Make sure you discuss any questions you have with your health care provider. Document Revised: 07/18/2018 Document Reviewed: 02/07/2017 Elsevier Patient Education  2021 [employer].

## 2020-08-31 NOTE — Assessment & Plan Note (Addendum)
Reviewing chart, no advanced directive on file. Reviewed with patient. Has advanced directives at home. Asked to bring [place] a copy. Daughter is HCPOA. He would want son to help decision as well. MOST form previously filled out and in chart (2020)

## 2020-08-31 NOTE — Progress Notes (Signed)
Patient ID: [name], male    DOB: [birth year], 80 y.o.   MRN: [id number]  This visit was conducted in person.  BP 118/80   Pulse 75   Temp 97.7 F (36.5 C) (Temporal)   Ht 5' 7.75" (1.721 m)   Wt 203 lb 8 oz (92.3 kg)   SpO2 95%   BMI 31.17 kg/m    CC: AMW f/u visit  Subjective:   HPI: Dameion Briles. is a 80 y.o. male presenting on 08/31/2020 for Annual Exam (Prt 2. )   Saw health advisor last week for medicare wellness visit. Note reviewed.   No exam data present  Flowsheet Row Clinical Support from 08/25/2020 in [facility] [place]  PHQ-2 Total Score 0      Fall Risk  08/25/2020 05/12/2019 12/21/2017 10/16/2016 07/24/2016  Falls in the past year? 0 0 No No No  Number falls in past yr: 0 - - - -  Injury with Fall? 0 - - - -  Risk for fall due to : Impaired balance/gait;Medication side effect - - Impaired mobility;Impaired balance/gait -  Follow up Falls evaluation completed;Falls prevention discussed - - - -     I last saw patient 03/2019. Recent palliative care ([facility]) referral through onc.   APML in remission - completed 6 wks of induction chemotherapy with ATRA (vit A) and ATO (arsenic). Arsenic discontinued due to poor tolerance. Blood PCR and FISH both revealed complete molecular remission. Port removed 07/2020. Closely followed by local onc Dr Grayland Ormond.   Steroid induced hyperglycemia - was able to taper off glipizide and insulin - now off prednisone.  Prostate cancer - saw uro Alinda Money) planned active surveillance given other comorbidities.  BiV pacemaker in place - regularly sees cardiology yearly.  Metastatic malignant melanoma to brain and lungs - sees neuro-onc yearly (11/2019) Hypogonadism - benefits from testosterone injections 100mg  Q2 wks. Last shot was yesterday.  Gout - continues allopurinol 200mg  daily. No recent gout flare. MDD - sees psychiatrist Dr [name] regularly on sertraline 50mg  daily and trazodone 25-50mg   nightly  Knee osteoarthritis saw Dr Lorelei Pont, managing with voltaren gel with benefit.   Breakfast today at 10:30am, nothing to eat or drink since then.   Preventative: COLONOSCOPY WITH PROPOFOL 05/08/2016 - diverticulosis, int hem, no f/u needed [name]).  Prostate cancer - see above Lung cancer screening - not eligible Flu shot - yearly COVID vaccine Moderna 04/2019, 05/2019, booster 11/2019 - thinks may have recently had 2nd booster at [place].  Pneumovax 07/2012, prevnar 04/2016 and 10/2013 Tdap 07/2012 Shingrix - 01/2018, 03/2018  Advanced directive discussion: reviewing chart, no advanced directive on file. Reviewed with patient. Has advanced directives at home. Asked to bring [place] a copy. Daughter is HCPOA. He would want son to help decision as well. MOST form previously filled out and in chart (2020). Also see below. Seat belt use discussed Sunscreen use discussed, no changing moles on skin. Avoids outdoors. Sees derm yearly for h/o MM.  Non smoker  Alcohol - has cut down significantly. Previously was drinking 3 bottles of wine a week  Dentist Q6 month Eye exam - overdue   Lives with wife with dementia in [facility] Daughter [name] and son Occ: retired Pharmacist, community Activity: tries to stay active Diet: good water, fruits/vegetables daily Has living will  [name] is health care POA Requests DNR-- done 09/14/15  Not sure about tube feeds      Relevant past  medical, surgical, family and social history reviewed and updated as indicated. Interim medical history since our last visit reviewed. Allergies and medications reviewed and updated. Outpatient Medications Prior to Visit  Medication Sig Dispense Refill  . acetaminophen (TYLENOL) 325 MG tablet Take 2 tablets (650 mg total) by mouth every 6 (six) hours as needed for mild pain (or Fever >/= 101).    Marland Kitchen allopurinol (ZYLOPRIM) 100 MG tablet Take 200 mg by mouth daily.    Marland Kitchen amLODipine (NORVASC) 2.5 MG tablet Take 1 tablet  (2.5 mg total) by mouth daily. Do not take if dizzy and Systolic BP (top number) is less than 120. 90 tablet 2  . aspirin EC 81 MG tablet Take 81 mg by mouth every evening.     . B-D SYRINGE/NEEDLE 3CC/23GX1" 23G X 1" 3 ML MISC USE AS DIRECTED WITH TESTOSTERONE INJECTION 4 each 10  . Cholecalciferol (VITAMIN D3) 25 MCG (1000 UT) CAPS Take 1 capsule (1,000 Units total) by mouth daily. 30 capsule 0  . colchicine 0.6 MG tablet Take 1 tablet (0.6 mg total) by mouth daily as needed (gout flare). May take 2 tablets on first day    . Evolocumab (REPATHA SURECLICK) 914 MG/ML SOAJ Inject 1 pen into the skin every 14 (fourteen) days. 2 mL 11  . furosemide (LASIX) 20 MG tablet Take 1 tablet (20 mg total) by mouth every other day.    . insulin lispro (HUMALOG) 100 UNIT/ML KwikPen Use per sliding scale - 2 units for every 50 over 150    . levothyroxine (SYNTHROID) 137 MCG tablet TAKE 1 TABLET BY MOUTH ONCE DAILY BEFORE BREAKFAST 30 tablet 11  . meclizine (ANTIVERT) 12.5 MG tablet Take 1 tablet (12.5 mg total) by mouth 3 (three) times daily as needed for dizziness. 30 tablet 0  . methylphenidate (RITALIN) 5 MG tablet TAKE 1 TABLET BY MOUTH TWICE DAILY AT BREAKFAST & LUNCH 60 tablet 0  . metoprolol succinate (TOPROL-XL) 50 MG 24 hr tablet TAKE 1 AND 1/2 TABLET (75 MG) BY MOUTH EVERY DAY WITH OR IMMEDIATELY FOLLOWING A MEAL *DO NOT CRUSH* 50 tablet 11  . sertraline (ZOLOFT) 50 MG tablet Take 1 tablet (50 mg total) by mouth daily. Take with breakfast.  Initiate 50mg  dose after completing 5 days of 25mg  . 90 tablet 3  . testosterone cypionate (DEPOTESTOSTERONE CYPIONATE) 200 MG/ML injection Inject 0.5 mLs (100 mg total) into the muscle every 14 (fourteen) days. 1 mL 1  . traZODone (DESYREL) 50 MG tablet Take 0.5-1 tablets (25-50 mg total) by mouth at bedtime. (Patient taking differently: Take 25 mg by mouth at bedtime as needed for sleep.) 30 tablet 1  . Turmeric 500 MG CAPS Take 2 capsules by mouth daily as needed  (joint pain).     Facility-Administered Medications Prior to Visit  Medication Dose Route Frequency Provider Last Rate Last Admin  . 0.9 %  sodium chloride infusion  1,000 mL Intravenous Once [name], MD         Per HPI unless specifically indicated in ROS section below Review of Systems Objective:  BP 118/80   Pulse 75   Temp 97.7 F (36.5 C) (Temporal)   Ht 5' 7.75" (1.721 m)   Wt 203 lb 8 oz (92.3 kg)   SpO2 95%   BMI 31.17 kg/m   Wt Readings from Last 3 Encounters:  08/31/20 203 lb 8 oz (92.3 kg)  08/06/20 200 lb (90.7 kg)  06/17/20 206 lb 8 oz (93.7 kg)  Physical Exam Vitals and nursing note reviewed.  Constitutional:      Appearance: Normal appearance. He is not ill-appearing.     Comments: Ambulates with quad cane   HENT:     Mouth/Throat:     Mouth: Mucous membranes are moist.     Pharynx: Oropharynx is clear. No oropharyngeal exudate or posterior oropharyngeal erythema.  Eyes:     Extraocular Movements: Extraocular movements intact.     Pupils: Pupils are equal, round, and reactive to light.  Cardiovascular:     Rate and Rhythm: Normal rate and regular rhythm.     Pulses: Normal pulses.     Heart sounds: Normal heart sounds. No murmur heard.   Pulmonary:     Effort: Pulmonary effort is normal. No respiratory distress.     Breath sounds: Normal breath sounds. No wheezing, rhonchi or rales.  Abdominal:     General: There is no distension.     Palpations: Abdomen is soft. There is no mass.     Tenderness: There is no abdominal tenderness. There is no guarding or rebound.  Musculoskeletal:     Cervical back: Normal range of motion and neck supple.     Right lower leg: No edema.     Left lower leg: No edema.  Lymphadenopathy:     Cervical: No cervical adenopathy.  Skin:    General: Skin is warm and dry.     Findings: No rash.  Neurological:     Mental Status: He is alert.  Psychiatric:        Mood and Affect: Mood normal.        Behavior:  Behavior normal.       Results for orders placed or performed in visit on 08/31/20  POCT glycosylated hemoglobin (Hb A1C)  Result Value Ref Range   Hemoglobin A1C 5.4 4.0 - 5.6 %   HbA1c POC (<> result, manual entry)     HbA1c, POC (prediabetic range)     HbA1c, POC (controlled diabetic range)     *Note: Due to a large number of results and/or encounters for the requested time period, some results have not been displayed. A complete set of results can be found in Results Review.    Lab Results  Component Value Date   CREATININE 1.64 (H) 06/09/2020   BUN 39 (H) 06/09/2020   NA 139 06/09/2020   K 5.0 06/09/2020   CL 105 06/09/2020   CO2 24 06/09/2020    Lab Results  Component Value Date   WBC 5.1 06/09/2020   HGB 15.6 06/09/2020   HCT 47.5 06/09/2020   MCV 94.4 06/09/2020   PLT 143 (L) 06/09/2020    Assessment & Plan:  This visit occurred during the SARS-CoV-2 public health emergency.  Safety protocols were in place, including screening questions prior to the visit, additional usage of staff PPE, and extensive cleaning of exam room while observing appropriate contact time as indicated for disinfecting solutions.   Problem List Items Addressed This Visit    Benign essential HTN    Chronic, great control on amlodipine 2.5mg  daily and metoprolol XL 75mg  daily.      OSA on CPAP    H/o OSA on CPAP - will need to verify continues using this.       Biventricular ICD (implantable cardioverter-defibrillator) in place   Obesity, Class I, BMI 30-34.9    Weight gain noted. Will continue to monitor.       Chronic kidney disease, stage III (moderate) (  [place])    Update labs when he returns fasting.       Relevant Orders   Renal function panel   CBC with Differential/Platelet   Microalbumin / creatinine urine ratio   Parathyroid hormone, intact (no Ca)   Malignant melanoma (HCC)    Known malignant melanoma metastatic to brain and lungs, unknown primary source. Regularly sees  oncology and dermatology.  Sees derm and neuro-onc yearly.      Metastasis to brain [facility])   Metastatic melanoma to lung [facility])   MDD (major depressive disorder), recurrent episode, moderate (HCC)    Stable period on sertraline 50mg  daily with trazodone nightly. Followed by psychiatry [name])      Acquired hypothyroidism    Update TFTs on levothyroxine 133mcg daily.       Relevant Orders   TSH   Advanced directives, counseling/discussion - Primary    Reviewing chart, no advanced directive on file. Reviewed with patient. Has advanced directives at home. Asked to bring [place] a copy. Daughter is HCPOA. He would want son to help decision as well. MOST form previously filled out and in chart (2020)      Atherosclerosis of native coronary artery of native heart with stable angina pectoris (HCC)   DJD (degenerative joint disease)    Recently saw sports medicine for bilateral knee osteoarthritis managing effectively with voltaren gel to knees.       Cognitive and neurobehavioral dysfunction following brain injury ([place])    Continues ritalin, sees psychiatry, neuropsychology and neuro-oncology.       Alcohol use    Has significantly cut down and doing well.       Prostate cancer [facility])    Planned active surveillance given comorbidities. Sees urology Dr Alinda Money.  May be due for f/u. Check PSA at [facility] next week.       Relevant Orders   PSA   Dyslipidemia    Chronic, on repatha - update FLP when he returns next week for fasting labs. Goal LDL <70 in CAD hx The ASCVD Risk score Mikey Bussing DC Jr., et al., 2013) failed to calculate for the following reasons:   The 2013 ASCVD risk score is only valid for ages 30 to 73   The patient has a prior MI or stroke diagnosis       Relevant Orders   Lipid panel   Hypogonadism in male    Continues testosterone 100mg  Q2 wk injections at home. Update T next week when he's mid-cycle.      Relevant Orders   PSA   Testosterone   Heart block AV complete  ([place])    BiV pacer in place.      Insomnia    Stable period on trazodone nightly through Dr [name].       Gout    Stable period on allopurinol 200mg  daily.  No recent gout flares. Anticipate cutting down on alcohol has helped with this as well.       Relevant Orders   Uric acid   Steroid-induced hyperglycemia    H/o steroid induced diabetes. Now off steroids for over a year, off antihyperglycemics for longer period. A1c remains in normal range. Will resolve diabetes problem, but merits close monitoring in the future.       Relevant Orders   POCT glycosylated hemoglobin (Hb A1C) (Completed)   APML (acute promyelocytic leukemia) in remission [facility])    Reviewed treatment to date.  He was unable to tolerate second arsenic treatment course.  Complete molecular remission  based on latest PCR and FISH.  Continues follow up with local oncologist Dr Grayland Ormond.       Relevant Orders   CBC with Differential/Platelet   Vitamin D deficiency    Update levels on next labs.  Continues vit D 1000 IU daily      Relevant Orders   VITAMIN D 25 Hydroxy (Vit-D Deficiency, Fractures)   Presence of biventricular cardiac pacemaker       No orders of the defined types were placed in this encounter.  Orders Placed This Encounter  Procedures  . Renal function panel    Standing Status:   Future    Standing Expiration Date:   08/31/2021  . Lipid panel    Standing Status:   Future    Standing Expiration Date:   08/31/2021  . PSA    Standing Status:   Future    Standing Expiration Date:   08/31/2021  . CBC with Differential/Platelet    Standing Status:   Future    Standing Expiration Date:   08/31/2021  . Testosterone    Standing Status:   Future    Standing Expiration Date:   08/31/2021  . VITAMIN D 25 Hydroxy (Vit-D Deficiency, Fractures)    Standing Status:   Future    Standing Expiration Date:   08/31/2021  . Uric acid    Standing Status:   Future    Standing Expiration Date:   08/31/2021   . Microalbumin / creatinine urine ratio    Standing Status:   Future    Standing Expiration Date:   09/02/2021  . Parathyroid hormone, intact (no Ca)    Standing Status:   Future    Standing Expiration Date:   09/02/2021  . TSH    Standing Status:   Future    Standing Expiration Date:   09/02/2021  . POCT glycosylated hemoglobin (Hb A1C)    Patient instructions: Schedule lab visit next Monday morning as early as you can and we will check all labs at that time. You A1c was in normal range! Stay as active as able.  We will call [facility] to check on most recent vaccine. Bring [place] a copy of your advanced directives/living will forms to update your chart.  Good to see you today Return as needed or in 1 year for next wellness visit.   Follow up plan: Return in about 1 year (around 08/31/2021), or if symptoms worsen or fail to improve, for medicare wellness visit.  [name], MD

## 2020-09-02 ENCOUNTER — Encounter: Payer: Self-pay | Admitting: Family Medicine

## 2020-09-02 NOTE — Assessment & Plan Note (Signed)
Continues ritalin, sees psychiatry, neuropsychology and neuro-oncology.

## 2020-09-02 NOTE — Assessment & Plan Note (Signed)
Update TFTs on levothyroxine 165mcg daily.

## 2020-09-02 NOTE — Assessment & Plan Note (Signed)
Stable period on trazodone nightly through Dr [name].

## 2020-09-02 NOTE — Progress Notes (Signed)
[place]  Telephone:([phone] [phone]  ID: David [name]. OB: [birth year]  MR#: [id number]  FYT#:[id number]  Patient Care Team: [name], MD as PCP - General (Family Medicine) Minna Merritts, MD as PCP - Cardiology (Cardiology) Deboraha Sprang, MD as PCP - Electrophysiology (Cardiology) Cira Rue, RN Nurse Navigator as Registered Nurse ([place]) Acquanetta Chain, DO as Consulting Physician [facility] and Palliative Medicine) [name], MD as Consulting Physician (Hematology and Oncology)  CHIEF COMPLAINT: Acute promyelocytic leukemia in remission  INTERVAL HISTORY: Patient returns to clinic today for repeat laboratory work and routine 60-monthevaluation.  He continues to have chronic weakness and fatigue, but otherwise feels well. He has no neurologic complaints.  He has a good appetite and denies weight loss.  He denies any recent fevers or illnesses. He denies any chest pain, shortness of breath, cough, or hemoptysis.  He denies any nausea, vomiting, constipation, or diarrhea.  He has no urinary complaints.  Patient offers no further specific complaints today.  REVIEW OF SYSTEMS:   Review of Systems  Constitutional: Positive for malaise/fatigue. Negative for fever and weight loss.  Respiratory: Negative.  Negative for cough, hemoptysis and shortness of breath.   Cardiovascular: Negative.  Negative for chest pain and leg swelling.  Gastrointestinal: Negative.  Negative for abdominal pain and nausea.  Genitourinary: Negative.  Negative for hematuria.  Musculoskeletal: Negative.  Negative for back pain.  Skin: Negative.  Negative for rash.  Neurological: Positive for weakness. Negative for dizziness, focal weakness and headaches.  Psychiatric/Behavioral: Negative.  The patient is not nervous/anxious.     As per HPI. Otherwise, a complete review of systems is negative.  PAST MEDICAL HISTORY: Past Medical  History:  Diagnosis Date  . Anginal pain ([place]   . Arthritis    mostly hands  . Benign familial hematuria   . BPH (benign prostatic hypertrophy)   . Brain cancer ([place]    melanoma with brain met  . Coronary artery disease   . GERD (gastroesophageal reflux disease)   . High cholesterol   . Hypertension   . Hypothyroidism   . Intracerebral hemorrhage ([place]   . Melanoma ([place]   . Metastatic melanoma to lung ([place]   . Mood disorder ([place]   . Myocardial infarction ([place]   . Pacemaker    due to syncope, 3rd degree HB ([facility] CRT-P 02/25/13 (Dr. BUvaldo Rising  . Rectal bleed    due to NSAIDS  . Renal disorder   . Sepsis ([place] 12/09/2018  . Skin cancer    melanoma  . Sleep apnea    uses CPAP  . Steroid-induced hyperglycemia   . Stroke ([facility]     PAST SURGICAL HISTORY: Past Surgical History:  Procedure Laterality Date  . APPLICATION OF CRANIAL NAVIGATION N/A 10/15/2015   Procedure: APPLICATION OF CRANIAL NAVIGATION;  Surgeon: JErline Levine MD;  [facility];  Service: Neurosurgery;  Laterality: N/A;  . BIV PACEMAKER GENERATOR CHANGEOUT N/A 04/02/2019   Procedure: BIV PACEMAKER GENERATOR CHANGEOUT;  Surgeon: TEvans Lance MD;  [facility] [place];  Service: Cardiovascular;  Laterality: N/A;  . CARDIAC CATHETERIZATION  2013  . CARDIAC SURGERY     bypass X 2  . COLONOSCOPY WITH PROPOFOL N/A 05/08/2016   diverticulosis, int hem, no f/u needed ([name]  . CORONARY ARTERY BYPASS GRAFT  2013   LIMA-LAD, SVG-PDA 03/27/11 (Dr. KFrancee Gentile  . CRANIOTOMY N/A 10/15/2015   Procedure: LEFT FRONTAL CRANIOTOMY  TUMOR EXCISION with Curve;  Surgeon: Erline Levine, MD;  Location: [place] [facility]URO ORS;  Service: Neurosurgery;  Laterality: N/A;  CRANIOTOMY TUMOR EXCISION  . EYE SURGERY    . FOOT SURGERY  08/2010  . IR REMOVAL TUN ACCESS W/ PORT W/O FL MOD SED  08/06/2020  . JOINT REPLACEMENT Left    partial knee  . KNEE ARTHROSCOPY  12/2008  . LYMPH NODE BIOPSY    . PACEMAKER INSERTION     . TONSILLECTOMY      FAMILY HISTORY: Family History  Problem Relation Age of Onset  . Cancer Mother 31       lung   . Hypertension Mother   . Hypertension Father   . Heart attack Father   . Heart disease Father   . Rheum arthritis Sister   . Cancer Maternal Grandmother     ADVANCED DIRECTIVES (Y/N):  N  HEALTH MAINTENANCE: Social History   Tobacco Use  . Smoking status: Former Smoker    Packs/day: 1.00    Years: 3.00    Pack years: 3.00    Quit date: 04/10/1957    Years since quitting: 63.4  . Smokeless tobacco: Never Used  Vaping Use  . Vaping Use: Never used  Substance Use Topics  . Alcohol use: Yes    Comment: maybe 2 beers twice a month   . Drug use: No     Colonoscopy:  PAP:  Bone density:  Lipid panel:  Allergies  Allergen Reactions  . Ezetimibe Other (See Comments)    Unknown reaction  . Simvastatin Other (See Comments)    Reaction:  Gave pt a fever  Fever - temp of 103.   In 2003    Current Outpatient Medications  Medication Sig Dispense Refill  . acetaminophen (TYLENOL) 325 MG tablet Take 2 tablets (650 mg total) by mouth every 6 (six) hours as needed for mild pain (or Fever >/= 101).    Marland Kitchen allopurinol (ZYLOPRIM) 100 MG tablet Take 200 mg by mouth daily.    Marland Kitchen aspirin EC 81 MG tablet Take 81 mg by mouth every evening.     . Cholecalciferol (VITAMIN D3) 25 MCG (1000 UT) CAPS Take 1 capsule (1,000 Units total) by mouth daily. 30 capsule 0  . colchicine 0.6 MG tablet Take 1 tablet (0.6 mg total) by mouth daily as needed (gout flare). May take 2 tablets on first day    . Evolocumab (REPATHA SURECLICK) 301 MG/ML SOAJ Inject 1 pen into the skin every 14 (fourteen) days. 2 mL 11  . furosemide (LASIX) 20 MG tablet Take 1 tablet (20 mg total) by mouth every other day.    . levothyroxine (SYNTHROID) 137 MCG tablet TAKE 1 TABLET BY MOUTH ONCE DAILY BEFORE BREAKFAST 30 tablet 11  . meclizine (ANTIVERT) 12.5 MG tablet Take 1 tablet (12.5 mg total) by mouth 3  (three) times daily as needed for dizziness. 30 tablet 0  . methylphenidate (RITALIN) 5 MG tablet TAKE 1 TABLET BY MOUTH TWICE DAILY AT BREAKFAST & LUNCH 60 tablet 0  . metoprolol succinate (TOPROL-XL) 50 MG 24 hr tablet TAKE 1 AND 1/2 TABLET (75 MG) BY MOUTH EVERY DAY WITH OR IMMEDIATELY FOLLOWING A MEAL *DO NOT CRUSH* 50 tablet 11  . sertraline (ZOLOFT) 50 MG tablet Take 1 tablet (50 mg total) by mouth daily. Take with breakfast.  Initiate 69m dose after completing 5 days of 263m. 90 tablet 3  . testosterone cypionate (DEPOTESTOSTERONE CYPIONATE) 200 MG/ML injection Inject 0.5 mLs (  100 mg total) into the muscle every 14 (fourteen) days. 1 mL 1  . traZODone (DESYREL) 50 MG tablet Take 0.5-1 tablets (25-50 mg total) by mouth at bedtime. (Patient taking differently: Take 25 mg by mouth at bedtime as needed for sleep.) 30 tablet 1  . Turmeric 500 MG CAPS Take 2 capsules by mouth daily as needed (joint pain).    Marland Kitchen amLODipine (NORVASC) 2.5 MG tablet Take 1 tablet (2.5 mg total) by mouth daily. Do not take if dizzy and Systolic BP (top number) is less than 120. 90 tablet 2  . B-D SYRINGE/NEEDLE 3CC/23GX1" 23G X 1" 3 ML MISC USE AS DIRECTED WITH TESTOSTERONE INJECTION (Patient not taking: Reported on 09/08/2020) 4 each 10  . insulin lispro (HUMALOG) 100 UNIT/ML KwikPen Use per sliding scale - 2 units for every 50 over 150 (Patient not taking: Reported on 09/08/2020)     No current facility-administered medications for this visit.   Facility-Administered Medications Ordered in Other Visits  Medication Dose Route Frequency Provider Last Rate Last Admin  . 0.9 %  sodium chloride infusion  1,000 mL Intravenous Once [name], MD        OBJECTIVE: Vitals:   09/08/20 1126  BP: 135/89  Pulse: 75  Resp: 18  Temp: (!) 97.3 F (36.3 C)  SpO2: 96%     Body mass index is 31.94 kg/m.    ECOG FS:0 - Asymptomatic  General: Well-developed, well-nourished, no acute distress. Eyes: Pink conjunctiva, anicteric  sclera. HEENT: Normocephalic, moist mucous membranes. Lungs: No audible wheezing or coughing. Heart: Regular rate and rhythm. Abdomen: Soft, nontender, no obvious distention. Musculoskeletal: No edema, cyanosis, or clubbing. Neuro: Alert, answering all questions appropriately. Cranial nerves grossly intact. Skin: No rashes or petechiae noted. Psych: Normal affect.  LAB RESULTS:  Lab Results  Component Value Date   NA 138 09/08/2020   K 4.8 09/08/2020   CL 103 09/08/2020   CO2 25 09/08/2020   GLUCOSE 131 (H) 09/08/2020   BUN 41 (H) 09/08/2020   CREATININE 1.70 (H) 09/08/2020   CALCIUM 9.1 09/08/2020   PROT 6.7 09/08/2020   ALBUMIN 3.9 09/08/2020   AST 20 09/08/2020   ALT 13 09/08/2020   ALKPHOS 57 09/08/2020   BILITOT 0.6 09/08/2020   GFRNONAA 40 (L) 09/08/2020   GFRAA 46 (L) 02/27/2020    Lab Results  Component Value Date   WBC 4.0 09/08/2020   NEUTROABS 2.9 09/08/2020   HGB 15.6 09/08/2020   HCT 47.3 09/08/2020   MCV 93.7 09/08/2020   PLT 167 09/08/2020     STUDIES: No results found.  ASSESSMENT: Acute promyelocytic leukemia in remission  PLAN:    1. Acute promyelocytic leukemia in remission: Patient completed his induction chemotherapy at [facility] in approximately November 2020.  Patient only completed 1 cycle of maintenance chemotherapy consisting of ATRA 14 days on 14 days off along with arsenic trioxide at 0.15 mg/kg/day Monday through Friday for 28 days prior to discontinuing treatment permanently secondary to declining performance status.  No intervention is needed at this time.  Peripheral blood PCR and FISH both revealed complete molecular remission.  Today's results are pending.  Return to clinic in 3 months for laboratory work only and then in 6 months for laboratory work and further evaluation. 2.  Nausea: Patient does not complain of this today.  Continue ondansetron as needed. 3.  Cardiac disease/biventricular pacer: Given patient's  biventricular pacing, he has a significantly abnormal EKG with a prolonged QTC of approximately  560. Continue close follow-up with cardiology as scheduled.  4.  Melanoma: PET scan results from November 26, 2019 reviewed independently and reported as above with no obvious evidence of recurrent or progressive disease.  CT of head results from November 24, 2019 also revealed no evidence of active disease.  Patient reports he no longer has follow-up with neuro oncology. 5.  Prostate cancer: Patient's PSA from Sep 07, 2020 continues to increase and is now 6.17. 6: Renal insufficiency: Chronic and unchanged.  Patient's creatinine is 1.7 today. 7.  Peripheral edema: Resolved.  Use Lasix sparingly and only as needed. 8.  Weakness and fatigue: Chronic and unchanged. 9.  Port removal: Port has been removed. 10.  Thrombocytopenia: Resolved.  Patient expressed understanding and was in agreement with this plan. He also understands that He can call clinic at any time with any questions, concerns, or complaints.     [name], MD   09/08/2020 4:05 PM

## 2020-09-02 NOTE — Assessment & Plan Note (Signed)
Update labs when he returns fasting.

## 2020-09-02 NOTE — Assessment & Plan Note (Signed)
Has significantly cut down and doing well.

## 2020-09-02 NOTE — Assessment & Plan Note (Signed)
Planned active surveillance given comorbidities. Sees urology Dr Alinda Money.  May be due for f/u. Check PSA at [facility] next week.

## 2020-09-02 NOTE — Assessment & Plan Note (Signed)
Recently saw sports medicine for bilateral knee osteoarthritis managing effectively with voltaren gel to knees.

## 2020-09-02 NOTE — Assessment & Plan Note (Signed)
Weight gain noted. Will continue to monitor.

## 2020-09-02 NOTE — Assessment & Plan Note (Signed)
Chronic, great control on amlodipine 2.5mg  daily and metoprolol XL 75mg  daily.

## 2020-09-02 NOTE — Assessment & Plan Note (Addendum)
Update levels on next labs.  Continues vit D 1000 IU daily

## 2020-09-02 NOTE — Assessment & Plan Note (Signed)
Stable period on sertraline 50mg  daily with trazodone nightly. Followed by psychiatry [name])

## 2020-09-02 NOTE — Assessment & Plan Note (Signed)
BiV pacer in place.

## 2020-09-02 NOTE — Assessment & Plan Note (Signed)
H/o OSA on CPAP - will need to verify continues using this.

## 2020-09-02 NOTE — Assessment & Plan Note (Signed)
H/o steroid induced diabetes. Now off steroids for over a year, off antihyperglycemics for longer period. A1c remains in normal range. Will resolve diabetes problem, but merits close monitoring in the future.

## 2020-09-02 NOTE — Assessment & Plan Note (Signed)
Known malignant melanoma metastatic to brain and lungs, unknown primary source. Regularly sees oncology and dermatology.  Sees derm and neuro-onc yearly.

## 2020-09-02 NOTE — Assessment & Plan Note (Signed)
Stable period on allopurinol 200mg  daily.  No recent gout flares. Anticipate cutting down on alcohol has helped with this as well.

## 2020-09-02 NOTE — Assessment & Plan Note (Signed)
Reviewed treatment to date.  He was unable to tolerate second arsenic treatment course.  Complete molecular remission based on latest PCR and FISH.  Continues follow up with local oncologist Dr Grayland Ormond.

## 2020-09-02 NOTE — Assessment & Plan Note (Signed)
Continues testosterone 100mg  Q2 wk injections at home. Update T next week when he's mid-cycle.

## 2020-09-02 NOTE — Assessment & Plan Note (Addendum)
Chronic, on repatha - update FLP when he returns next week for fasting labs. Goal LDL <70 in CAD hx The ASCVD Risk score David Bussing DC Jr., et al., 2013) failed to calculate for the following reasons:   The 2013 ASCVD risk score is only valid for ages 95 to 65   The patient has a prior MI or stroke diagnosis

## 2020-09-07 ENCOUNTER — Other Ambulatory Visit (INDEPENDENT_AMBULATORY_CARE_PROVIDER_SITE_OTHER): Payer: Medicare Other

## 2020-09-07 ENCOUNTER — Other Ambulatory Visit: Payer: Self-pay

## 2020-09-07 DIAGNOSIS — E559 Vitamin D deficiency, unspecified: Secondary | ICD-10-CM

## 2020-09-07 DIAGNOSIS — C9241 Acute promyelocytic leukemia, in remission: Secondary | ICD-10-CM

## 2020-09-07 DIAGNOSIS — E039 Hypothyroidism, unspecified: Secondary | ICD-10-CM | POA: Diagnosis not present

## 2020-09-07 DIAGNOSIS — E291 Testicular hypofunction: Secondary | ICD-10-CM | POA: Diagnosis not present

## 2020-09-07 DIAGNOSIS — M1A09X0 Idiopathic chronic gout, multiple sites, without tophus (tophi): Secondary | ICD-10-CM | POA: Diagnosis not present

## 2020-09-07 DIAGNOSIS — E785 Hyperlipidemia, unspecified: Secondary | ICD-10-CM

## 2020-09-07 DIAGNOSIS — C61 Malignant neoplasm of prostate: Secondary | ICD-10-CM

## 2020-09-07 DIAGNOSIS — N1832 Chronic kidney disease, stage 3b: Secondary | ICD-10-CM

## 2020-09-07 LAB — RENAL FUNCTION PANEL
Albumin: 4.3 g/dL (ref 3.5–5.2)
BUN: 41 mg/dL — ABNORMAL HIGH (ref 6–23)
CO2: 27 mEq/L (ref 19–32)
Calcium: 9.6 mg/dL (ref 8.4–10.5)
Chloride: 104 mEq/L (ref 96–112)
Creatinine, Ser: 1.8 mg/dL — ABNORMAL HIGH (ref 0.40–1.50)
GFR: 34.94 mL/min — ABNORMAL LOW (ref >60.00)
Glucose, Bld: 83 mg/dL (ref 70–99)
Phosphorus: 4.1 mg/dL (ref 2.3–4.6)
Potassium: 5.2 mEq/L — ABNORMAL HIGH (ref 3.5–5.1)
Sodium: 139 mEq/L (ref 135–145)

## 2020-09-07 LAB — CBC WITH DIFFERENTIAL/PLATELET
Basophils Absolute: 0 10*3/uL (ref 0.0–0.1)
Basophils Relative: 0.7 % (ref 0.0–3.0)
Eosinophils Absolute: 0.2 10*3/uL (ref 0.0–0.7)
Eosinophils Relative: 5 % (ref 0.0–5.0)
HCT: 47.9 % (ref 39.0–52.0)
Hemoglobin: 16 g/dL (ref 13.0–17.0)
Lymphocytes Relative: 15.4 % (ref 12.0–46.0)
Lymphs Abs: 0.6 10*3/uL — ABNORMAL LOW (ref 0.7–4.0)
MCHC: 33.3 g/dL (ref 30.0–36.0)
MCV: 94.4 fl (ref 78.0–100.0)
Monocytes Absolute: 0.4 10*3/uL (ref 0.1–1.0)
Monocytes Relative: 10.2 % (ref 3.0–12.0)
Neutro Abs: 2.7 10*3/uL (ref 1.4–7.7)
Neutrophils Relative %: 68.7 % (ref 43.0–77.0)
Platelets: 178 10*3/uL (ref 150.0–400.0)
RBC: 5.08 Mil/uL (ref 4.22–5.81)
RDW: 15.3 % (ref 11.5–15.5)
WBC: 3.9 10*3/uL — ABNORMAL LOW (ref 4.0–10.5)

## 2020-09-07 LAB — MICROALBUMIN / CREATININE URINE RATIO
Creatinine,U: 53.7 mg/dL
Microalb Creat Ratio: 253.4 mg/g — ABNORMAL HIGH (ref 0.0–30.0)
Microalb, Ur: 136 mg/dL — ABNORMAL HIGH (ref 0.0–1.9)

## 2020-09-07 LAB — TESTOSTERONE: Testosterone: 275.8 ng/dL — ABNORMAL LOW (ref 300.00–890.00)

## 2020-09-07 LAB — VITAMIN D 25 HYDROXY (VIT D DEFICIENCY, FRACTURES): VITD: 25.45 ng/mL — ABNORMAL LOW (ref 30.00–100.00)

## 2020-09-07 LAB — URIC ACID: Uric Acid, Serum: 5.2 mg/dL (ref 4.0–7.8)

## 2020-09-07 LAB — TSH: TSH: 4.39 u[IU]/mL (ref 0.35–4.50)

## 2020-09-07 LAB — LIPID PANEL
Cholesterol: 218 mg/dL — ABNORMAL HIGH (ref 0–200)
HDL: 38.8 mg/dL — ABNORMAL LOW (ref >39.00)
NonHDL: 178.89
Total CHOL/HDL Ratio: 6
Triglycerides: 267 mg/dL — ABNORMAL HIGH (ref 0.0–149.0)
VLDL: 53.4 mg/dL — ABNORMAL HIGH (ref 0.0–40.0)

## 2020-09-07 LAB — PSA: PSA: 6.17 ng/mL — ABNORMAL HIGH (ref 0.10–4.00)

## 2020-09-07 LAB — LDL CHOLESTEROL, DIRECT: Direct LDL: 113 mg/dL

## 2020-09-07 NOTE — Addendum Note (Signed)
Addended by: Ellamae Sia on: 09/07/2020 11:11 AM   Modules accepted: Orders

## 2020-09-08 ENCOUNTER — Encounter: Payer: Self-pay | Admitting: Oncology

## 2020-09-08 ENCOUNTER — Other Ambulatory Visit: Payer: Self-pay

## 2020-09-08 ENCOUNTER — Inpatient Hospital Stay: Payer: Medicare Other | Attending: Oncology

## 2020-09-08 ENCOUNTER — Inpatient Hospital Stay (HOSPITAL_BASED_OUTPATIENT_CLINIC_OR_DEPARTMENT_OTHER): Payer: Medicare Other | Admitting: Oncology

## 2020-09-08 VITALS — BP 135/89 | HR 75 | Temp 97.3°F | Resp 18 | Ht 67.0 in | Wt 203.9 lb

## 2020-09-08 DIAGNOSIS — C61 Malignant neoplasm of prostate: Secondary | ICD-10-CM | POA: Diagnosis not present

## 2020-09-08 DIAGNOSIS — Z87891 Personal history of nicotine dependence: Secondary | ICD-10-CM | POA: Diagnosis not present

## 2020-09-08 DIAGNOSIS — R531 Weakness: Secondary | ICD-10-CM | POA: Diagnosis not present

## 2020-09-08 DIAGNOSIS — C9241 Acute promyelocytic leukemia, in remission: Secondary | ICD-10-CM

## 2020-09-08 DIAGNOSIS — R5383 Other fatigue: Secondary | ICD-10-CM | POA: Insufficient documentation

## 2020-09-08 DIAGNOSIS — N189 Chronic kidney disease, unspecified: Secondary | ICD-10-CM | POA: Insufficient documentation

## 2020-09-08 DIAGNOSIS — Z85820 Personal history of malignant melanoma of skin: Secondary | ICD-10-CM | POA: Insufficient documentation

## 2020-09-08 DIAGNOSIS — Z801 Family history of malignant neoplasm of trachea, bronchus and lung: Secondary | ICD-10-CM | POA: Diagnosis not present

## 2020-09-08 DIAGNOSIS — Z950 Presence of cardiac pacemaker: Secondary | ICD-10-CM | POA: Insufficient documentation

## 2020-09-08 DIAGNOSIS — I519 Heart disease, unspecified: Secondary | ICD-10-CM | POA: Insufficient documentation

## 2020-09-08 LAB — COMPREHENSIVE METABOLIC PANEL
ALT: 13 U/L (ref 0–44)
AST: 20 U/L (ref 15–41)
Albumin: 3.9 g/dL (ref 3.5–5.0)
Alkaline Phosphatase: 57 U/L (ref 38–126)
Anion gap: 10 (ref 5–15)
BUN: 41 mg/dL — ABNORMAL HIGH (ref 8–23)
CO2: 25 mmol/L (ref 22–32)
Calcium: 9.1 mg/dL (ref 8.9–10.3)
Chloride: 103 mmol/L (ref 98–111)
Creatinine, Ser: 1.7 mg/dL — ABNORMAL HIGH (ref 0.61–1.24)
GFR, Estimated: 40 mL/min — ABNORMAL LOW (ref >60)
Glucose, Bld: 131 mg/dL — ABNORMAL HIGH (ref 70–99)
Potassium: 4.8 mmol/L (ref 3.5–5.1)
Sodium: 138 mmol/L (ref 135–145)
Total Bilirubin: 0.6 mg/dL (ref 0.3–1.2)
Total Protein: 6.7 g/dL (ref 6.5–8.1)

## 2020-09-08 LAB — CBC WITH DIFFERENTIAL/PLATELET
Abs Immature Granulocytes: 0.01 10*3/uL (ref 0.00–0.07)
Basophils Absolute: 0 10*3/uL (ref 0.0–0.1)
Basophils Relative: 1 %
Eosinophils Absolute: 0.2 10*3/uL (ref 0.0–0.5)
Eosinophils Relative: 6 %
HCT: 47.3 % (ref 39.0–52.0)
Hemoglobin: 15.6 g/dL (ref 13.0–17.0)
Immature Granulocytes: 0 %
Lymphocytes Relative: 14 %
Lymphs Abs: 0.6 10*3/uL — ABNORMAL LOW (ref 0.7–4.0)
MCH: 30.9 pg (ref 26.0–34.0)
MCHC: 33 g/dL (ref 30.0–36.0)
MCV: 93.7 fL (ref 80.0–100.0)
Monocytes Absolute: 0.3 10*3/uL (ref 0.1–1.0)
Monocytes Relative: 6 %
Neutro Abs: 2.9 10*3/uL (ref 1.7–7.7)
Neutrophils Relative %: 73 %
Platelets: 167 10*3/uL (ref 150–400)
RBC: 5.05 MIL/uL (ref 4.22–5.81)
RDW: 14.3 % (ref 11.5–15.5)
WBC: 4 10*3/uL (ref 4.0–10.5)
nRBC: 0 % (ref 0.0–0.2)

## 2020-09-08 LAB — PARATHYROID HORMONE, INTACT (NO CA): PTH: 66 pg/mL (ref 16–77)

## 2020-09-09 ENCOUNTER — Encounter: Payer: Self-pay | Admitting: Internal Medicine

## 2020-09-11 ENCOUNTER — Other Ambulatory Visit: Payer: Self-pay | Admitting: Family Medicine

## 2020-09-11 ENCOUNTER — Encounter: Payer: Self-pay | Admitting: Family Medicine

## 2020-09-11 DIAGNOSIS — R809 Proteinuria, unspecified: Secondary | ICD-10-CM | POA: Insufficient documentation

## 2020-09-13 ENCOUNTER — Other Ambulatory Visit: Payer: Self-pay | Admitting: *Deleted

## 2020-09-13 NOTE — Progress Notes (Signed)
Refilled Ritalin via fax 09/09/2020

## 2020-09-14 LAB — PML/RAR TRANSLOCATION BY PCR

## 2020-09-28 ENCOUNTER — Ambulatory Visit (INDEPENDENT_AMBULATORY_CARE_PROVIDER_SITE_OTHER): Payer: Medicare Other

## 2020-09-28 DIAGNOSIS — I442 Atrioventricular block, complete: Secondary | ICD-10-CM | POA: Diagnosis not present

## 2020-09-29 LAB — CUP PACEART REMOTE DEVICE CHECK
Battery Remaining Longevity: 40 mo
Battery Remaining Percentage: 76 %
Battery Voltage: 2.95 V
Brady Statistic AP VP Percent: 99 %
Brady Statistic AP VS Percent: 1 %
Brady Statistic AS VP Percent: 1 %
Brady Statistic AS VS Percent: 1 %
Brady Statistic RA Percent Paced: 99 %
Date Time Interrogation Session: 20220621020014
Implantable Lead Implant Date: 19980107
Implantable Lead Implant Date: 19980107
Implantable Lead Implant Date: 20141118
Implantable Lead Location: 753858
Implantable Lead Location: 753859
Implantable Lead Location: 753860
Implantable Pulse Generator Implant Date: 20201223
Lead Channel Impedance Value: 1050 Ohm
Lead Channel Impedance Value: 400 Ohm
Lead Channel Impedance Value: 450 Ohm
Lead Channel Pacing Threshold Amplitude: 0.5 V
Lead Channel Pacing Threshold Amplitude: 1.125 V
Lead Channel Pacing Threshold Amplitude: 2 V
Lead Channel Pacing Threshold Pulse Width: 0.5 ms
Lead Channel Pacing Threshold Pulse Width: 0.5 ms
Lead Channel Pacing Threshold Pulse Width: 1 ms
Lead Channel Sensing Intrinsic Amplitude: 2.9 mV
Lead Channel Setting Pacing Amplitude: 2 V
Lead Channel Setting Pacing Amplitude: 2.125
Lead Channel Setting Pacing Amplitude: 3.5 V
Lead Channel Setting Pacing Pulse Width: 0.5 ms
Lead Channel Setting Pacing Pulse Width: 1 ms
Lead Channel Setting Sensing Sensitivity: 4 mV
Pulse Gen Model: 3562
Pulse Gen Serial Number: 6033885

## 2020-10-04 NOTE — Progress Notes (Signed)
Cardiology Office Note  Date:  10/05/2020   ID:  [name], DOB 19-Jun-1940, MRN [id number]  PCP:  [name], MD   Chief Complaint  Patient presents with   6 month follow up     "Doing well." Medications reviewed by the patient verbally.     HPI:  Mr. Grayson Pfefferle is a 80 year old gentleman with past medical history of Former smoker, quit 1960 etoh abuse CAD, CABG 2014 (prior angina sx : syncope) Cardiomyopathy, resolved Syncope, 3rd degree heart block, pacer, upgrade to [facility]. Jude ICD CRT-P 02/25/13 [place]  osa on CPAP metastatic melanoma stage IV, brain surgery 4+ years ago Memory issues, recent events aphasia, not able to communicate much. Presenting for f/u of his for SOB, history of cardiomyopathy  Last seen in the office by myself January 2020 Seen by one of our providers May 2021  Crestor changed to repatha (LDL remains above goal on PCSK9 inhibitor) Denies having any symptoms on the Crestor, no myalgias Sedentary at baseline Legs are weak, reports able to walk 300 yards Is a walker  In the past, problems with low testosterone level Followed by urology  No chest pain concerning for angina, denies shortness of breath  on CPAP Feels he does better on his machine  Echo 08/2019: Normal EF 55 to 60%  EKG personally reviewed by myself on todays visit Shows paced rhythm rate 76 bpm no significant change  Previous records reviewed PET scan 11/4663 hypermetabolic 9.9JT lingular nodule, and a 53mm right upper lobe lung nodule, left subclavicular nodes, and bone metastasis in the proximal right humerus and right femur.  Mets to head  Echo 09/29/2016 Left ventricle: The cavity size was normal. Wall thickness was   increased in a pattern of mild LVH. Systolic function was normal.   The estimated ejection fraction was in the range of 60% to 65%.   Wall motion was normal; there were no regional wall motion   abnormalities. Doppler parameters are  consistent with abnormal   left ventricular relaxation (grade 1 diastolic dysfunction).  PMH:   has a past medical history of Anginal pain ([place]), Arthritis, Benign familial hematuria, BPH (benign prostatic hypertrophy), Brain cancer ([place]), Coronary artery disease, GERD (gastroesophageal reflux disease), High cholesterol, Hypertension, Hypothyroidism, Intracerebral hemorrhage ([place]), Melanoma ([place]), Metastatic melanoma to lung [facility]), Mood disorder ([place]), Myocardial infarction [facility]), Pacemaker, Rectal bleed, Renal disorder, Sepsis ([place]) (12/09/2018), Skin cancer, Sleep apnea, Steroid-induced hyperglycemia, and Stroke ([place]).  PSH:    Past Surgical History:  Procedure Laterality Date   APPLICATION OF CRANIAL NAVIGATION N/A 10/15/2015   Procedure: APPLICATION OF CRANIAL NAVIGATION;  Surgeon: Erline Levine, MD;  Location: [place] NEURO ORS;  Service: Neurosurgery;  Laterality: N/A;   BIV PACEMAKER GENERATOR CHANGEOUT N/A 04/02/2019   Procedure: BIV PACEMAKER GENERATOR CHANGEOUT;  Surgeon: Evans Lance, MD;  Location: [place] CV LAB;  Service: Cardiovascular;  Laterality: N/A;   CARDIAC CATHETERIZATION  2013   CARDIAC SURGERY     bypass X 2   COLONOSCOPY WITH PROPOFOL N/A 05/08/2016   diverticulosis, int hem, no f/u needed [name])   CORONARY ARTERY BYPASS GRAFT  2013   LIMA-LAD, SVG-PDA 03/27/11 (Dr. Francee Gentile)   [place] N/A 10/15/2015   Procedure: LEFT FRONTAL CRANIOTOMY TUMOR EXCISION with Curve;  Surgeon: Erline Levine, MD;  Location: [place] NEURO ORS;  Service: Neurosurgery;  Laterality: N/A;  CRANIOTOMY TUMOR EXCISION   EYE SURGERY     FOOT SURGERY  08/2010   IR REMOVAL TUN ACCESS W/ PORT  W/O FL MOD SED  08/06/2020   JOINT REPLACEMENT Left    partial knee   KNEE ARTHROSCOPY  12/2008   LYMPH NODE BIOPSY     PACEMAKER INSERTION     TONSILLECTOMY      Current Outpatient Medications  Medication Sig Dispense Refill   acetaminophen (TYLENOL) 325 MG tablet Take 2 tablets (650 mg total) by mouth every 6  (six) hours as needed for mild pain (or Fever >/= 101).     allopurinol (ZYLOPRIM) 100 MG tablet Take 200 mg by mouth daily.     amLODipine (NORVASC) 2.5 MG tablet Take 1 tablet (2.5 mg total) by mouth daily. Do not take if dizzy and Systolic BP (top number) is less than 120. 90 tablet 2   aspirin EC 81 MG tablet Take 81 mg by mouth every evening.      Cholecalciferol (VITAMIN D3) 25 MCG (1000 UT) CAPS Take 1 capsule (1,000 Units total) by mouth daily. 30 capsule 0   colchicine 0.6 MG tablet Take 1 tablet (0.6 mg total) by mouth daily as needed (gout flare). May take 2 tablets on first day     Evolocumab (REPATHA SURECLICK) 619 MG/ML SOAJ Inject 1 pen into the skin every 14 (fourteen) days. 2 mL 11   furosemide (LASIX) 20 MG tablet Take 1 tablet (20 mg total) by mouth every other day.     levothyroxine (SYNTHROID) 137 MCG tablet TAKE 1 TABLET BY MOUTH ONCE DAILY BEFORE BREAKFAST 30 tablet 11   meclizine (ANTIVERT) 12.5 MG tablet Take 1 tablet (12.5 mg total) by mouth 3 (three) times daily as needed for dizziness. 30 tablet 0   methylphenidate (RITALIN) 5 MG tablet TAKE 1 TABLET BY MOUTH TWICE DAILY AT BREAKFAST & LUNCH 60 tablet 0   metoprolol succinate (TOPROL-XL) 50 MG 24 hr tablet TAKE 1 AND 1/2 TABLET (75 MG) BY MOUTH EVERY DAY WITH OR IMMEDIATELY FOLLOWING A MEAL *DO NOT CRUSH* 50 tablet 11   sertraline (ZOLOFT) 50 MG tablet Take 1 tablet (50 mg total) by mouth daily. Take with breakfast.  Initiate 50mg  dose after completing 5 days of 25mg  . 90 tablet 3   testosterone cypionate (DEPOTESTOSTERONE CYPIONATE) 200 MG/ML injection Inject 0.5 mLs (100 mg total) into the muscle every 14 (fourteen) days. 1 mL 1   traZODone (DESYREL) 50 MG tablet Take 0.5-1 tablets (25-50 mg total) by mouth at bedtime. 30 tablet 1   Turmeric 500 MG CAPS Take 2 capsules by mouth daily as needed (joint pain).     No current facility-administered medications for this visit.   Facility-Administered Medications Ordered in  Other Visits  Medication Dose Route Frequency Provider Last Rate Last Admin   0.9 %  sodium chloride infusion  1,000 mL Intravenous Once [name], MD        Allergies:   Ezetimibe and Simvastatin   Social History:  The patient  reports that he quit smoking about 63 years ago. His smoking use included cigarettes. He has a 3.00 pack-year smoking history. He has never used smokeless tobacco. He reports current alcohol use. He reports that he does not use drugs.   Family History:   family history includes Cancer in his maternal grandmother; Cancer (age of onset: 16) in his mother; Heart attack in his father; Heart disease in his father; Hypertension in his father and mother; Rheum arthritis in his sister.    Review of Systems: Review of Systems  Constitutional: Negative.   Respiratory: Negative.  Cardiovascular: Negative.   Gastrointestinal: Negative.   Musculoskeletal: Negative.        Gait instability  Neurological: Negative.   Psychiatric/Behavioral:  Positive for memory loss.   All other systems reviewed and are negative.   PHYSICAL EXAM: VS:  BP 120/70 (BP Location: Left Arm, Patient Position: Sitting, Cuff Size: Normal)   Pulse 76   Ht 5\' 8"  (1.727 m)   Wt 207 lb 8 oz (94.1 kg)   SpO2 97%   BMI 31.55 kg/m  , BMI Body mass index is 31.55 kg/m. Constitutional:   No distress.  HENT:  Head: Grossly normal Eyes:  no discharge. No scleral icterus.  Neck: No JVD, no carotid bruits  Cardiovascular: Regular rate and rhythm, no murmurs appreciated Pulmonary/Chest: Clear to auscultation bilaterally, no wheezes or rails Abdominal: Soft.  no distension.  no tenderness.  Musculoskeletal: Normal range of motion Neurological:  normal muscle tone. Coordination normal. No atrophy Skin: Skin warm and dry Psychiatric: normal affect, pleasant, memory issues   Recent Labs: 09/07/2020: TSH 4.39 09/08/2020: ALT 13; BUN 41; Creatinine, Ser 1.70; Hemoglobin 15.6; Platelets 167; Potassium  4.8; Sodium 138    Lipid Panel Lab Results  Component Value Date   CHOL 218 (H) 09/07/2020   HDL 38.80 (L) 09/07/2020   LDLCALC 135 (H) 08/24/2015   TRIG 267.0 (H) 09/07/2020  LDL 81   Wt Readings from Last 3 Encounters:  10/05/20 207 lb 8 oz (94.1 kg)  09/08/20 203 lb 14.4 oz (92.5 kg)  08/31/20 203 lb 8 oz (92.3 kg)      ASSESSMENT AND PLAN:  Atherosclerosis of native coronary artery of native heart with stable angina pectoris (HCC) -  Currently with no symptoms of angina. No further workup at this time. Continue current medication regimen.  Hx of CABG - Plan: EKG 12-Lead Currently with no symptoms of angina. No further workup at this time. Continue current medication regimen. We will add Crestor with his PCSK9 inhibitor to achieve goal LDL less than 70  Ischemic cardiomyopathy - Plan: EKG 12-Lead echocardiogram EF 60-65% Appears euvolemic Changes as above  Biventricular ICD (implantable cardioverter-defibrillator) in place Followed by Dr. Caryl Comes, EP  Mixed hyperlipidemia Reports having problem in the past on Zetia Recommend he continue Crestor 20 with his PCSK9 inhibitor  Benign essential HTN Blood pressure is well controlled on today's visit. No changes made to the medications.  Secondary malignant melanoma of lung, unspecified laterality ([place]) Brain surgery, Reports PET scans negative   Total encounter time more than 25 minutes  Greater than 50% was spent in counseling and coordination of care with the patient    No orders of the defined types were placed in this encounter.    Signed, Esmond Plants, M.D., Ph.D. 10/05/2020  [place], [place]

## 2020-10-05 ENCOUNTER — Other Ambulatory Visit: Payer: Self-pay

## 2020-10-05 ENCOUNTER — Ambulatory Visit (INDEPENDENT_AMBULATORY_CARE_PROVIDER_SITE_OTHER): Payer: Medicare Other | Admitting: Cardiovascular Disease

## 2020-10-05 ENCOUNTER — Encounter: Payer: Self-pay | Admitting: Cardiovascular Disease

## 2020-10-05 VITALS — BP 120/70 | HR 76 | Ht 68.0 in | Wt 207.5 lb

## 2020-10-05 DIAGNOSIS — I10 Essential (primary) hypertension: Secondary | ICD-10-CM | POA: Diagnosis not present

## 2020-10-05 DIAGNOSIS — I255 Ischemic cardiomyopathy: Secondary | ICD-10-CM

## 2020-10-05 DIAGNOSIS — Z951 Presence of aortocoronary bypass graft: Secondary | ICD-10-CM

## 2020-10-05 DIAGNOSIS — E785 Hyperlipidemia, unspecified: Secondary | ICD-10-CM | POA: Diagnosis not present

## 2020-10-05 DIAGNOSIS — Z950 Presence of cardiac pacemaker: Secondary | ICD-10-CM

## 2020-10-05 MED ORDER — ROSUVASTATIN CALCIUM 20 MG PO TABS: 20.0000 mg | ORAL_TABLET | Freq: Every day | ORAL | 3 refills | Status: DC

## 2020-10-05 NOTE — Patient Instructions (Addendum)
Medication Instructions:  Please restart crestor 20 mg daily  If you need a refill on your cardiac medications before your next appointment, please call your pharmacy.    Lab work: No new labs needed   If you have labs (blood work) drawn today and your tests are completely normal, you will receive your results only by: [place] (if you have MyChart) OR A paper copy in the mail If you have any lab test that is abnormal or we need to change your treatment, we will call you to review the results.   Testing/Procedures: No new testing needed   Follow-Up: At [facility] you and your health needs are our priority.  As part of our continuing mission to provide you with exceptional heart care, we have created designated Provider Care Teams.  These Care Teams include your primary Cardiologist (physician) and Advanced Practice Providers (APPs -  Physician Assistants and Nurse Practitioners) who all work together to provide you with the care you need, when you need it.  You will need a follow up appointment in 6 months  Providers on your designated Care Team:   Murray Hodgkins, NP Christell Faith, PA-C Marrianne Mood, PA-C Fabienne Bruns, [place]  Any Other Special Instructions Will Be Listed Below (If Applicable).  COVID-19 Vaccine Information can be found at: [email or web address].uk For questions related to vaccine distribution or appointments, please email [email or web address] or call [phone].

## 2020-10-06 ENCOUNTER — Other Ambulatory Visit: Payer: Self-pay | Admitting: Internal Medicine

## 2020-10-11 ENCOUNTER — Other Ambulatory Visit: Payer: Self-pay | Admitting: Family Medicine

## 2020-10-12 ENCOUNTER — Other Ambulatory Visit: Payer: Self-pay

## 2020-10-12 ENCOUNTER — Other Ambulatory Visit (INDEPENDENT_AMBULATORY_CARE_PROVIDER_SITE_OTHER): Payer: Medicare Other

## 2020-10-12 DIAGNOSIS — R809 Proteinuria, unspecified: Secondary | ICD-10-CM

## 2020-10-12 LAB — MICROALBUMIN / CREATININE URINE RATIO
Creatinine,U: 73.8 mg/dL
Microalb Creat Ratio: 213.7 mg/g — ABNORMAL HIGH (ref 0.0–30.0)
Microalb, Ur: 157.7 mg/dL — ABNORMAL HIGH (ref 0.0–1.9)

## 2020-10-12 LAB — URINALYSIS, ROUTINE W REFLEX MICROSCOPIC
Bilirubin Urine: NEGATIVE
Ketones, ur: NEGATIVE
Leukocytes,Ua: NEGATIVE
Nitrite: NEGATIVE
Specific Gravity, Urine: 1.025 (ref 1.000–1.030)
Total Protein, Urine: 100 — AB
Urine Glucose: NEGATIVE
Urobilinogen, UA: 0.2 (ref 0.0–1.0)
pH: 6 (ref 5.0–8.0)

## 2020-10-12 NOTE — Telephone Encounter (Signed)
Name of Medication: Testosterone cypionate inj Name of Pharmacy: [place] or Written Date and Quantity: 09/09/20, #1 mL Last Office Visit and Type: 08/31/20, AWV prt 2 Next Office Visit and Type: 09/02/20, AWV prt 2 Last Controlled Substance Agreement Date: none Last UDS: none

## 2020-10-13 ENCOUNTER — Other Ambulatory Visit: Payer: Self-pay | Admitting: Internal Medicine

## 2020-10-13 NOTE — Telephone Encounter (Signed)
ERx 

## 2020-10-14 ENCOUNTER — Other Ambulatory Visit: Payer: Self-pay | Admitting: *Deleted

## 2020-10-14 ENCOUNTER — Encounter: Payer: Self-pay | Admitting: Family Medicine

## 2020-10-14 ENCOUNTER — Other Ambulatory Visit: Payer: Self-pay | Admitting: Family Medicine

## 2020-10-14 DIAGNOSIS — N029 Recurrent and persistent hematuria with unspecified morphologic changes: Secondary | ICD-10-CM

## 2020-10-14 DIAGNOSIS — R3129 Other microscopic hematuria: Secondary | ICD-10-CM | POA: Insufficient documentation

## 2020-10-14 DIAGNOSIS — C61 Malignant neoplasm of prostate: Secondary | ICD-10-CM

## 2020-10-14 NOTE — Telephone Encounter (Signed)
Spoke with pt. (See Labs, Result Notes, 10/12/20)

## 2020-10-14 NOTE — Progress Notes (Signed)
Rx for methylphenidate faxed for quantity of 60 today

## 2020-10-18 NOTE — Progress Notes (Signed)
Remote pacemaker transmission.   

## 2020-10-26 DIAGNOSIS — F332 Major depressive disorder, recurrent severe without psychotic features: Secondary | ICD-10-CM | POA: Diagnosis not present

## 2020-10-26 DIAGNOSIS — F5105 Insomnia due to other mental disorder: Secondary | ICD-10-CM | POA: Diagnosis not present

## 2020-11-09 ENCOUNTER — Encounter: Payer: Self-pay | Admitting: *Deleted

## 2020-11-09 ENCOUNTER — Other Ambulatory Visit: Payer: Self-pay | Admitting: Radiation Therapy

## 2020-11-22 ENCOUNTER — Other Ambulatory Visit: Payer: Self-pay

## 2020-11-22 ENCOUNTER — Telehealth: Payer: Self-pay | Admitting: *Deleted

## 2020-11-22 ENCOUNTER — Ambulatory Visit
Admission: RE | Admit: 2020-11-22 | Discharge: 2020-11-22 | Disposition: A | Discharge status: Home / Self Care | Payer: Medicare Other | Source: Ambulatory Visit | Attending: Internal Medicine | Admitting: Internal Medicine

## 2020-11-22 DIAGNOSIS — C439 Malignant melanoma of skin, unspecified: Secondary | ICD-10-CM | POA: Diagnosis not present

## 2020-11-22 DIAGNOSIS — G9389 Other specified disorders of brain: Secondary | ICD-10-CM | POA: Diagnosis not present

## 2020-11-22 DIAGNOSIS — C7931 Secondary malignant neoplasm of brain: Secondary | ICD-10-CM | POA: Insufficient documentation

## 2020-11-22 LAB — POCT I-STAT CREATININE: Creatinine, Ser: 2 mg/dL — ABNORMAL HIGH (ref 0.61–1.24)

## 2020-11-22 MED ORDER — IOHEXOL 300 MG/ML  SOLN
50.0000 mL | Freq: Once | INTRAMUSCULAR | Status: AC | PRN
  Administered 2020-11-22: 50 mL via INTRAVENOUS

## 2020-11-22 MED ORDER — IOHEXOL 300 MG/ML  SOLN: 75.0000 mL | Freq: Once | INTRAMUSCULAR | Status: DC | PRN

## 2020-11-22 NOTE — Telephone Encounter (Signed)
Returned PC to patient, he states he has CT today & that his creatnine is 2, is asking if he should see a nephrologist.  Patient has appointment with Dr. Mickeal Skinner next week.  Instructed patient to contact his PCP about his creatnine level & to keep his upcoming appointment with Dr. Mickeal Skinner.  Patient verbalizes understanding.

## 2020-11-29 ENCOUNTER — Inpatient Hospital Stay: Payer: Medicare Other

## 2020-11-29 ENCOUNTER — Other Ambulatory Visit: Payer: Self-pay

## 2020-11-29 ENCOUNTER — Inpatient Hospital Stay: Payer: Medicare Other | Attending: Internal Medicine | Admitting: Internal Medicine

## 2020-11-29 VITALS — BP 135/96 | HR 74 | Temp 96.8°F | Wt 205.2 lb

## 2020-11-29 DIAGNOSIS — Z950 Presence of cardiac pacemaker: Secondary | ICD-10-CM | POA: Insufficient documentation

## 2020-11-29 DIAGNOSIS — C773 Secondary and unspecified malignant neoplasm of axilla and upper limb lymph nodes: Secondary | ICD-10-CM | POA: Insufficient documentation

## 2020-11-29 DIAGNOSIS — R4189 Other symptoms and signs involving cognitive functions and awareness: Secondary | ICD-10-CM | POA: Diagnosis not present

## 2020-11-29 DIAGNOSIS — I252 Old myocardial infarction: Secondary | ICD-10-CM | POA: Diagnosis not present

## 2020-11-29 DIAGNOSIS — G9340 Encephalopathy, unspecified: Secondary | ICD-10-CM | POA: Insufficient documentation

## 2020-11-29 DIAGNOSIS — C7951 Secondary malignant neoplasm of bone: Secondary | ICD-10-CM | POA: Diagnosis not present

## 2020-11-29 DIAGNOSIS — Z87891 Personal history of nicotine dependence: Secondary | ICD-10-CM | POA: Diagnosis not present

## 2020-11-29 DIAGNOSIS — C7931 Secondary malignant neoplasm of brain: Secondary | ICD-10-CM | POA: Diagnosis not present

## 2020-11-29 DIAGNOSIS — C9241 Acute promyelocytic leukemia, in remission: Secondary | ICD-10-CM | POA: Diagnosis not present

## 2020-11-29 DIAGNOSIS — Z801 Family history of malignant neoplasm of trachea, bronchus and lung: Secondary | ICD-10-CM | POA: Diagnosis not present

## 2020-11-29 DIAGNOSIS — C439 Malignant melanoma of skin, unspecified: Secondary | ICD-10-CM | POA: Insufficient documentation

## 2020-11-29 NOTE — Progress Notes (Signed)
[facility] [place], [place] [address] [phone]   Interval Evaluation  Date of Service: 11/29/20 Patient Name: David [name]. Patient MRN: [id number] Patient DOB: [birth year] Provider: Ventura Sellers, MD  Identifying Statement:  David [name]. is a 80 y.o. male with melanoma, APML, brain metastases  Primary Cancer: Melanoma Stage IV  Oncologic History: Oncology History Overview Note  Metastatic melanoma ([place])   Staging form: Melanoma of the Skin, AJCC 7th Edition     Clinical stage from 09/21/2015: Stage IV [place], [place], [place]) - Signed by [name], MD on 10/09/2015     Malignant melanoma ([place])  09/16/2015 Imaging   PET scan showed a hypermetabolic 6.5HQ lingular nodule, and a 22m right upper lobe lung nodule, left subclavicular nodes, and bone metastasis in the proximal right humerus and right femur.    09/21/2015 Initial Diagnosis   Metastatic melanoma ([facility] [place]   09/21/2015 Initial Biopsy   Left subclavicular lymph node biopsy showed prostatic of melanoma   09/21/2015 Miscellaneous   BRAF V600K mutation (+)    10/04/2015 Imaging   CT head with without contrast showed a 2.0 x 1.6 x 1.7 cm superficial left frontal hyperdense lesion, with postcontrast enhancement, lying within the previous hemorrhagic area, concerning for solitary melanoma metastasis   10/06/2015 - 09/06/2016 Chemotherapy   Nivolumab 2480mevery 2 weeks, held due to his dexamethasone for radionecrosis    10/13/2015 - 10/13/2015 Radiation Therapy   10/13/2015 Preop SRS treatment: 14 Gy  to the left frontal lesion in 1 fraction   10/15/2015 Surgery   left front craniotomy and tumor excision    10/15/2015 Pathology Results   left frontal brain mass excision showed metastatic melanoma, 1.5X1.5X0.6cm   01/13/2016 Imaging   CT head without and with contrast showed ill-defined enhancement and dural thickening of the left frontal resection cavity may represent a  post treatment changes or residual neoplasm. A new 6 mm hypodensity in the right cerebellar hemisphere with uncertainty enhancement may represent a metastasis or small brain parenchymal hemorrhage.    01/27/2016 - 01/27/2016 Radiation Therapy   01/27/16 SRS Treatment:  20 Gy in 1 fraction to a 6 mm right cerebellar brain met    02/2016 - 05/24/2018 Chemotherapy   Monthly Xgeva starting 02/2016, changed to every 3 months on 01/30/2018. Stopped on 05/24/2018   03/29/2016 PET scan   PET 03/29/2016 IMPRESSION: 1. Overall there has been an interval response to therapy. The index lesions sided on previous exam it are less hypermetabolic when compared with the previous exam. 2. There is an intramuscular lesion within the right masseter which has increased FDG uptake compared with previous exam. 3. No new sites of disease identified. 4. Aortic atherosclerosis and infrarenal abdominal aortic aneurysm. Recommend followup by ultrasound in 3 years. This recommendation follows ACR consensus guidelines: White Paper of the ACR Incidental Findings Committee II on Vascular Findings. J Joellyn Ruedadiol 2013; 10:789-794   04/20/2016 Imaging   CT Head w wo contrast 1. Mild patchy enhancement at the left anterior frontal lobe treatment site (series 11, image 39) without mass effect and stable surrounding white matter hypodensity without strong evidence of acute vasogenic edema. Recommend continued CT surveillance. 2. No new metastatic disease or new intracranial abnormality identified. 3. Probable benign 14 mm left parietal bone lucent lesion, unchanged since May 2017.   05/06/2016 - 05/08/2016 Hospital Admission   Pt was admitted for rectal bleeding for one day.  Breathing spontaneously resolved, hemoglobin dropped from 15 to 10.5, did not require blood transfusion. Colonoscopy showed no active bleeding, except diverticulosis and hemorrhoid. He was discharged to home.   05/08/2016 Procedure   Colonoscopy   - Stool in the ascending colon. Fluid aspiration performed. - Diverticulosis in the entire examined colon. - Non-bleeding internal hemorrhoids. - The examination was otherwise normal on direct and retroflexion views.   05/29/2016 Imaging   PET Image Restaging IMPRESSION: 1. Stable exam with no evidence progression. 2. Photopenia in the LEFT frontal lobe at prior surgical site. 3. Stable mildly hypermetabolic RIGHT paratracheal node. This may represent a thyroid nodule. 4. Stable size of minimally metabolic lingular pulmonary nodule. 5. Stable skeletal lesion of the RIGHT iliac crest 6. No cutaneous metabolic lesion.   07/20/2016 Imaging   CT Head W WO Contrast IMPRESSION: Pronounced change in the left frontal region. Much more regional vasogenic edema. Relatively stable appearance of the abnormal enhancement extending from the lateral margin of the frontal horn of left lateral ventricle to the frontal brain surface. New region of abnormal enhancement surrounding the frontal horn of the left lateral ventricle measuring approximately 3.2 cm. The differential diagnosis is recurrent tumor versus radiation necrosis. The patient is reportedly not an MRI candidate. This area is large enough that CT perfusion could possibly make a reasonable differentiation.   09/02/2016 Imaging   CT Head 09/02/16 IMPRESSION: Somewhat mixed pattern of slight posterior progression of pleomorphic enhancement surrounding the LEFT lateral ventricle although no significant mass effect, and reduced vasogenic edema compared with the scan in April.   09/27/2016 - 11/02/2016 Chemotherapy   Start dabrafenib and trametinib 09/27/16, stopped due to severe fatigue     10/05/2016 Imaging   CT Head w & w/o contrast  IMPRESSION: 1. Decreased size of the enhancing area within the left frontal lobe treatment site. Surrounding edema has also decreased. The findings support continued evolution of radiation necrosis. 2.  No new enhancing lesions.   10/18/2016 PET scan   IMPRESSION: 1. New focal hypermetabolism in the proximal sigmoid colon with associated focal colonic wall thickening and mild pericolonic fat stranding at the site of a proximal sigmoid diverticulum, favored to represent mild acute sigmoid diverticulitis. Metastatic disease is considered less likely. Clinical correlation is necessary. Consider appropriate antibiotic therapy, as clinically warranted. Attention to this region recommended on future PET-CT follow-up. 2. Otherwise complete metabolic response with no evidence of hypermetabolic metastatic disease. Lingular pulmonary nodule is slightly decreased in size and demonstrates no significant metabolism. 3. No residual hypermetabolism in the right thyroid lobe nodule, which is stable in size. 4. Aortic Atherosclerosis (ICD10-I70.0). Stable 3.1 cm infrarenal abdominal aortic aneurysm. Aortic aneurysm NOS (ICD10-I71.9). Recommend followup by ultrasound in 3 years. This recommendation follows ACR consensus guidelines: White Paper of the ACR Incidental Findings Committee II on Vascular Findings. Joellyn Rued Radiol 2013; 53:299-242.   11/01/2016 - 11/04/2016 Hospital Admission   Patient admitted to the hospital due to hypercalcemia where he received IV fluids and pamidronate   12/13/2016 PET scan   PET 12/13/16 IMPRESSION: 1. No findings to suggest metastatic disease on today's examination. 2. Previously noted hypermetabolism in the sigmoid colon has resolved, presumably indicative of a focus of diverticulitis on the prior study. Today's study does demonstrate relatively diffuse hypermetabolism throughout the cecum, ascending colon and proximal transverse colon where there is some mild mural thickening, favored to reflect mild chronic inflammation related to underlying diverticular disease. No definite inflammatory changes on the CT portion of  the examination to strongly suggest an active  acute diverticulitis at this time. No discrete lesion to suggest neoplasm. 3. Previously noted 8 mm lingular nodule is stable in size and known remains non hypermetabolic. This is nonspecific but reassuring. Continued attention on future followup studies is recommended. 4. Aortic atherosclerosis, in addition to left main and 3 vessel coronary artery disease. Status post median sternotomy for CABG including LIMA to the LAD. In addition, there is mild fusiform aneurysmal dilatation of the infrarenal abdominal aorta (3.1 cm in diameter) as well as aneurysmal dilatation of the left common iliac   12/18/2016 - 12/31/2017 Chemotherapy   Restarted nivolamab 255m every 2 weeks, with tapering dose dexamethasone     04/18/2017 Imaging   PET Scan IMPRESSION: 1. No hypermetabolic lesion to suggest active malignancy. 2. Stable 8 mm lingular nodule is not currently hypermetabolic but merit surveillance. Infrarenal abdominal aortic aneurysm 3.1 cm in diameter. Recommend followup by [facility] 3 years. 3. Other imaging findings of potential clinical significance: Postoperative findings in the left frontal lobe. Right renal cyst. Aortic Atherosclerosis (ICD10-I70.0). Osteoarthritis. Lumbar scoliosis. Left knee effusion. Colonic diverticulosis.    10/10/2017 PET scan   IMPRESSION: 1. Small focus of FDG accumulation in the subcutaneous fat of the right gluteal region, nonspecific. Attention on follow-up suggested. 2. Otherwise stable exam without new or progressive hypermetabolic disease in the interval since prior PET-CT. 3. Stable 8 mm nodule in the lingula. No evidence for hypermetabolism on PET imaging. 4.  Aortic Atherosclerois (ICD10-170.0)     01/29/2018 PET scan   IMPRESSION: 1. Small focus of hypermetabolism in the lower left paratracheal neck, which appears to localize to the posterior left thyroid lobe, without discrete thyroid nodule on the CT images, decreased in metabolism since 10/10/2017 PET-CT.  This finding is nonspecific and may represent a hypermetabolic thyroid nodule. Consider thyroid ultrasound correlation. 2. No new hypermetabolic findings suspicious for metastatic disease. Previously described low level metabolism in the subcutaneous right gluteal region has resolved, compatible with resolved inflammatory focus. No evidence of recurrent hypermetabolism within the stable lingular pulmonary nodule. No evidence of recurrent osseous hypermetabolism. 3. Stable infrarenal 3.4 cm Abdominal Aortic Aneurysm (ICD10-I71.9). Recommend follow-up aortic ultrasound in 3 years. This recommendation follows ACR consensus guidelines: White Paper of the ACR Incidental Findings Committee II on Vascular Findings. J Am Coll Radiol 2013;; 54:270-623 4.  Aortic Atherosclerosis (ICD10-I70.0).   09/18/2018 PET scan   PET 09/18/18  IMPRESSION: 1. No findings of active malignancy. The known small right cerebellar mass is not readily apparent on PET-CT but was shown on today's CT head. 2. 3.2 cm infrarenal abdominal aortic aneurysm. Recommend followup by [facility] 3 years. This recommendation follows ACR consensus guidelines: White Paper of the ACR Incidental Findings Committee II on Vascular Findings. J Am Coll Radiol 2013; 10:789-794. Aortic aneurysm NOS (ICD10-I71.9). 3. Other imaging findings of potential clinical significance: Left frontal lobe encephalomalacia underlying the craniotomy site. Chronic paranasal sinusitis. Aortic Atherosclerosis (ICD10-I70.0). Coronary atherosclerosis. Mild cardiomegaly. Colonic diverticulosis. Dextroconvex lumbar scoliosis. Small left knee effusion with mild synovitis. Postoperative findings in the right foot.   09/18/2018 Imaging   CT head  IMPRESSION: 1. No evidence of new intracranial metastases or acute abnormality. 2. Unchanged 4 mm right cerebellar metastasis. 3. Stable post treatment changes in the left frontal lobe.   APML (acute promyelocytic  leukemia) in remission ([facility]  01/08/2019 Initial Diagnosis   APML (acute promyelocytic leukemia) in remission ([facility]   04/14/2019 - 05/09/2019 Chemotherapy   The  patient had palonosetron (ALOXI) injection 0.25 mg, 0.25 mg, Intravenous,  Once, 2 of 4 cycles Administration: 0.25 mg (04/15/2019), 0.25 mg (04/17/2019), 0.25 mg (04/21/2019), 0.25 mg (04/23/2019), 0.25 mg (04/25/2019), 0.25 mg (04/28/2019), 0.25 mg (04/30/2019), 0.25 mg (05/02/2019), 0.25 mg (05/05/2019), 0.25 mg (05/07/2019), 0.25 mg (05/09/2019), 0.25 mg (04/18/2019) arsenic trioxide (TRISENOX) 13.3 mg in sodium chloride 0.9 % 250 mL chemo Infusion, 0.15 mg/kg = 13.3 mg (100 % of original dose 0.15 mg/kg), Intravenous,  Once, 2 of 4 cycles Dose modification: 0.15 mg/kg/day (original dose 0.15 mg/kg, Cycle 1, Reason: Other (see comments)), 0.15 mg/kg (original dose 0.15 mg/kg, Cycle 1, Reason: Other (see comments), Comment: change to mg/kg) Administration: 13.3 mg (04/21/2019), 13.3 mg (04/22/2019), 13.3 mg (04/23/2019), 13.3 mg (04/24/2019), 13.3 mg (04/25/2019), 13.3 mg (04/28/2019), 13.3 mg (04/29/2019), 13.3 mg (04/30/2019), 13.3 mg (05/01/2019), 13.3 mg (05/02/2019), 13.3 mg (05/05/2019), 13.3 mg (05/06/2019), 13.3 mg (05/07/2019), 13.3 mg (05/08/2019), 13.3 mg (05/09/2019)   for chemotherapy treatment.       Interval History:  David [name]. presents today for follow up following recent MRI brain.  Denies new or progressive neurologic deficits today.  His activity level may have dipped somewhat, however.  He continues to have periods of confusion, bad days as prior. Mood has been stable with zoloft.  No seizures or headaches.     Medications: Current Outpatient Medications on File Prior to Visit  Medication Sig Dispense Refill   acetaminophen (TYLENOL) 325 MG tablet Take 2 tablets (650 mg total) by mouth every 6 (six) hours as needed for mild pain (or Fever >/= 101).     allopurinol (ZYLOPRIM) 100 MG tablet Take 200 mg by mouth daily.     amLODipine  (NORVASC) 2.5 MG tablet Take 1 tablet (2.5 mg total) by mouth daily. Do not take if dizzy and Systolic BP (top number) is less than 120. 90 tablet 2   aspirin EC 81 MG tablet Take 81 mg by mouth every evening.      Cholecalciferol (VITAMIN D3) 25 MCG (1000 UT) CAPS Take 1 capsule (1,000 Units total) by mouth daily. 30 capsule 0   colchicine 0.6 MG tablet Take 1 tablet (0.6 mg total) by mouth daily as needed (gout flare). May take 2 tablets on first day     Evolocumab (REPATHA SURECLICK) 045 MG/ML SOAJ Inject 1 pen into the skin every 14 (fourteen) days. 2 mL 11   furosemide (LASIX) 20 MG tablet Take 1 tablet (20 mg total) by mouth every other day.     levothyroxine (SYNTHROID) 137 MCG tablet TAKE 1 TABLET BY MOUTH ONCE DAILY BEFORE BREAKFAST 30 tablet 11   meclizine (ANTIVERT) 12.5 MG tablet Take 1 tablet (12.5 mg total) by mouth 3 (three) times daily as needed for dizziness. 30 tablet 0   methylphenidate (RITALIN) 5 MG tablet TAKE 1 TABLET BY MOUTH TWICE DAILY AT BREAKFAST & LUNCH 14 tablet 0   metoprolol succinate (TOPROL-XL) 50 MG 24 hr tablet TAKE 1 AND 1/2 TABLET (75 MG) BY MOUTH EVERY DAY WITH OR IMMEDIATELY FOLLOWING A MEAL *DO NOT CRUSH* 50 tablet 11   rosuvastatin (CRESTOR) 20 MG tablet Take 1 tablet (20 mg total) by mouth daily. 90 tablet 3   sertraline (ZOLOFT) 50 MG tablet Take 1 tablet (50 mg total) by mouth daily. Take with breakfast.  Initiate 25m dose after completing 5 days of 284m. 90 tablet 3   testosterone cypionate (DEPOTESTOSTERONE CYPIONATE) 200 MG/ML injection INJECT 0.5ML (100MG TOTAL) INTRAMUSCULARLY EVERY  14 DAYS (SINGLE DOSE VIAL) *ONLY GOOD FOR 1 DOSE* *CALL FOR REFILL* 1 mL 4   traZODone (DESYREL) 50 MG tablet Take 0.5-1 tablets (25-50 mg total) by mouth at bedtime. 30 tablet 1   Turmeric 500 MG CAPS Take 2 capsules by mouth daily as needed (joint pain).     Current Facility-Administered Medications on File Prior to Visit  Medication Dose Route Frequency Provider Last  Rate Last Admin   0.9 %  sodium chloride infusion  1,000 mL Intravenous Once [name], MD        Allergies:  Allergies  Allergen Reactions   Ezetimibe Other (See Comments)    Unknown reaction   Simvastatin Other (See Comments)    Reaction:  Gave pt a fever  Fever - temp of 103.   In 2003   Past Medical History:  Past Medical History:  Diagnosis Date   Anginal pain ([place])    Arthritis    mostly hands   Benign familial hematuria    BPH (benign prostatic hypertrophy)    Brain cancer ([facility])    melanoma with brain met   Coronary artery disease    GERD (gastroesophageal reflux disease)    High cholesterol    Hypertension    Hypothyroidism    Intracerebral hemorrhage ([facility])    Melanoma ([place])    Metastatic melanoma to lung ([facility])    Mood disorder ([place])    Myocardial infarction [facility])    Pacemaker    due to syncope, 3rd degree HB (upgrade to St. Jude CRT-P 02/25/13 (Dr. Uvaldo Rising)   Rectal bleed    due to NSAIDS   Renal disorder    Sepsis ([place]) 12/09/2018   Skin cancer    melanoma   Sleep apnea    uses CPAP   Steroid-induced hyperglycemia    Stroke [facility])    Past Surgical History:  Past Surgical History:  Procedure Laterality Date   APPLICATION OF CRANIAL NAVIGATION N/A 10/15/2015   Procedure: APPLICATION OF CRANIAL NAVIGATION;  Surgeon: Erline Levine, MD;  Location: [facility];  Service: Neurosurgery;  Laterality: N/A;   BIV PACEMAKER GENERATOR CHANGEOUT N/A 04/02/2019   Procedure: BIV PACEMAKER GENERATOR CHANGEOUT;  Surgeon: Evans Lance, MD;  Location: [facility];  Service: Cardiovascular;  Laterality: N/A;   CARDIAC CATHETERIZATION  2013   CARDIAC SURGERY     bypass X 2   COLONOSCOPY WITH PROPOFOL N/A 05/08/2016   diverticulosis, int hem, no f/u needed [name])   CORONARY ARTERY BYPASS GRAFT  2013   LIMA-LAD, SVG-PDA 03/27/11 (Dr. Francee Gentile)   [place] N/A 10/15/2015   Procedure: LEFT FRONTAL CRANIOTOMY TUMOR EXCISION with Curve;  Surgeon: Erline Levine, MD;   Location: [facility];  Service: Neurosurgery;  Laterality: N/A;  CRANIOTOMY TUMOR EXCISION   EYE SURGERY     FOOT SURGERY  08/2010   IR REMOVAL TUN ACCESS W/ PORT W/O FL MOD SED  08/06/2020   JOINT REPLACEMENT Left    partial knee   KNEE ARTHROSCOPY  12/2008   LYMPH NODE BIOPSY     PACEMAKER INSERTION     TONSILLECTOMY     Social History:  Social History   Socioeconomic History   Marital status: Married    Spouse name: Not on file   Number of children: 2   Years of education: Not on file   Highest education level: Not on file  Occupational History   Occupation: retired Pharmacist, community  Tobacco Use   Smoking status: Former  Packs/day: 1.00    Years: 3.00    Pack years: 3.00    Types: Cigarettes    Quit date: 04/10/1957    Years since quitting: 63.6   Smokeless tobacco: Never  Vaping Use   Vaping Use: Never used  Substance and Sexual Activity   Alcohol use: Yes    Comment: maybe 2 beers twice a month    Drug use: No   Sexual activity: Not Currently  Other Topics Concern   Not on file  Social History Narrative   Lives with wife [name]) with dementia in [place] children - daughter [name] and son    Occ: retired Pharmacist, community practiced in [place]   Has living will   [name] is health care [place]   Requests DNR-- done 09/14/15, declines tube feeds - verified 03/2019   Social Determinants of Health   Financial Resource Strain: Low Risk    Difficulty of Paying Living Expenses: Not hard at all  Food Insecurity: No Food Insecurity   Worried About Charity fundraiser in the Last Year: Never true   [place] in the Last Year: Never true  Transportation Needs: No Transportation Needs   Lack of Transportation (Medical): No   Lack of Transportation (Non-Medical): No  Physical Activity: Inactive   Days of Exercise per Week: 0 days   Minutes of Exercise per Session: 0 min  Stress: No Stress Concern Present   Feeling of Stress : Not at all  Social  Connections: Not on file  Intimate Partner Violence: Not At Risk   Fear of Current or Ex-Partner: No   Emotionally Abused: No   Physically Abused: No   Sexually Abused: No   Family History:  Family History  Problem Relation Age of Onset   Cancer Mother 32       lung    Hypertension Mother    Hypertension Father    Heart attack Father    Heart disease Father    Rheum arthritis Sister    Cancer Maternal Grandmother     Review of Systems: Constitutional: Denies fevers, chills or abnormal weight loss Eyes: Denies blurriness of vision Ears, nose, mouth, throat, and face: Denies mucositis or sore throat Respiratory: Denies cough, dyspnea or wheezes Cardiovascular: Denies palpitation, chest discomfort or lower extremity swelling Gastrointestinal:  Denies nausea, constipation, diarrhea GU: Denies dysuria or incontinence Skin: Denies abnormal skin rashes Neurological: Per HPI Musculoskeletal: Denies joint pain, back or neck discomfort. No decrease in ROM Behavioral/Psych: Denies anxiety, disturbance in thought content, and mood instability   Physical Exam: Vitals:   11/29/20 0958  BP: (!) 135/96  Pulse: 74  Temp: (!) 96.8 F (36 C)  SpO2: 96%   KPS: 70. General: Alert, cooperative, pleasant, in no acute distress Head: Craniotomy scar noted, dry and intact. EENT: No conjunctival injection or scleral icterus. Oral mucosa moist Lungs: Resp effort normal Cardiac: Regular rate and rhythm Abdomen: Soft, non-distended abdomen Skin: No rashes cyanosis or petechiae. Extremities: No clubbing or edema  Neurologic Exam: Mental Status: Awake, alert, attentive to examiner. Oriented to self and environment. Language is fluent with intact comprehension.  Age advanced psychomotor slowing.  Limited insight at times.  Cranial Nerves: Visual acuity is grossly normal. Visual fields are full. Extra-ocular movements intact. No ptosis. Face is symmetric, tongue midline. Motor: Tone and bulk  are normal. Power is impaired to 4+/5 right leg, flexors>extendors. Subtle atrophy appreciated in left proximal hip girdle musculature.  Reflexes are symmetric, no pathologic reflexes present. Intact finger to nose bilaterally Sensory: Intact to light touch and temperature Gait: Mildly hemiparetic   Labs: I have reviewed the data as listed    Component Value Date/Time   NA 138 09/08/2020 1058   NA 140 02/27/2020 1242   NA 140 03/26/2017 0928   K 4.8 09/08/2020 1058   K 4.7 03/26/2017 0928   CL 103 09/08/2020 1058   CO2 25 09/08/2020 1058   CO2 21 (L) 03/26/2017 0928   GLUCOSE 131 (H) 09/08/2020 1058   GLUCOSE 95 03/26/2017 0928   BUN 41 (H) 09/08/2020 1058   BUN 33 (H) 02/27/2020 1242   BUN 19.3 03/26/2017 0928   CREATININE 2.00 (H) 11/22/2020 1420   CREATININE 1.77 (H) 05/24/2018 1445   CREATININE 1.2 03/26/2017 0928   CALCIUM 9.1 09/08/2020 1058   CALCIUM 8.7 03/26/2017 0928   PROT 6.7 09/08/2020 1058   PROT 6.8 03/26/2017 0928   ALBUMIN 3.9 09/08/2020 1058   ALBUMIN 3.7 03/26/2017 0928   AST 20 09/08/2020 1058   AST 18 05/24/2018 1445   AST 22 03/26/2017 0928   ALT 13 09/08/2020 1058   ALT 13 05/24/2018 1445   ALT 15 03/26/2017 0928   ALKPHOS 57 09/08/2020 1058   ALKPHOS 58 03/26/2017 0928   BILITOT 0.6 09/08/2020 1058   BILITOT 0.6 05/24/2018 1445   BILITOT 0.58 03/26/2017 0928   GFRNONAA 40 (L) 09/08/2020 1058   GFRNONAA 36 (L) 05/24/2018 1445   GFRAA 46 (L) 02/27/2020 1242   GFRAA 42 (L) 05/24/2018 1445   Lab Results  Component Value Date   WBC 4.0 09/08/2020   NEUTROABS 2.9 09/08/2020   HGB 15.6 09/08/2020   HCT 47.3 09/08/2020   MCV 93.7 09/08/2020   PLT 167 09/08/2020   Imaging:  [facility] Clinician Interpretation: I have personally reviewed the CNS images as listed.  My interpretation, in the context of the patient's clinical presentation, is stable disease  CT Head W Wo Contrast  Result Date: 11/23/2020 CLINICAL DATA:  Melanoma with brain metastases.  EXAM: CT HEAD WITHOUT AND WITH CONTRAST TECHNIQUE: Contiguous axial images were obtained from the base of the skull through the vertex without and with intravenous contrast CONTRAST:  71m OMNIPAQUE IOHEXOL 300 MG/ML  SOLN COMPARISON:  11/24/2019 FINDINGS: Brain: Status post left frontal craniotomy with subjacent encephalomalacia. Unchanged appearance of the resection cavity. Redemonstrated minimal nodular enhancement about the dilated frontal horn of the left lateral ventricle is unchanged and likely postoperative. No new enhancement or masslike area. No acute infarct. No extra-axial collection. Vascular: No hyperdense vessel or unexpected calcification. Visible vessels are patent. Skull: Prior left frontal craniotomy no aggressive osseous lesion. Sinuses/Orbits: Left scleral band. Prior lens replacements. Normal paranasal sinuses. Other: The mastoids are well aerated IMPRESSION: No new enhancement or masslike area to suggest new metastatic disease. Electronically Signed   By: AMerilyn BabaM.D.   On: 11/23/2020 09:44     Assessment/Plan 1. Brain metastases ([place]  2. Cognitive Impairment  Mr. HCaterinois clinically and radiographically stable today.  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    No changes to medications recommended today.   We ask that CMorton Stall return to clinic in 12 months following next CT head, or sooner as needed.  He will continue to follow with Dr. FBurr Medicoand Dr. FGrayland Ormondlocally for systemic therapy.  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  We appreciate the  opportunity to participate in the care of David [name]..   All questions were answered. The patient knows to call the clinic with any problems, questions or concerns. No barriers to learning were detected.  The total time spent in the encounter was 30 minutes minutes and more than 50% was on counseling and review of test  results   Ventura Sellers, MD Medical Director of Neuro-Oncology [facility] at [place] 11/29/20 9:54 AM

## 2020-11-30 ENCOUNTER — Other Ambulatory Visit: Payer: Self-pay | Admitting: Radiation Therapy

## 2020-12-01 ENCOUNTER — Encounter: Payer: Self-pay | Admitting: Oncology

## 2020-12-01 ENCOUNTER — Other Ambulatory Visit: Payer: Self-pay | Admitting: Radiation Therapy

## 2020-12-01 DIAGNOSIS — C439 Malignant melanoma of skin, unspecified: Secondary | ICD-10-CM

## 2020-12-01 DIAGNOSIS — C7931 Secondary malignant neoplasm of brain: Secondary | ICD-10-CM

## 2020-12-04 ENCOUNTER — Encounter: Payer: Self-pay | Admitting: Oncology

## 2020-12-04 NOTE — Telephone Encounter (Signed)
This encounter was created in error - please disregard.

## 2020-12-06 ENCOUNTER — Other Ambulatory Visit: Payer: Self-pay

## 2020-12-06 ENCOUNTER — Encounter: Payer: Self-pay | Admitting: Podiatry

## 2020-12-06 ENCOUNTER — Ambulatory Visit (INDEPENDENT_AMBULATORY_CARE_PROVIDER_SITE_OTHER): Payer: Medicare Other | Admitting: Podiatry

## 2020-12-06 DIAGNOSIS — B351 Tinea unguium: Secondary | ICD-10-CM

## 2020-12-06 DIAGNOSIS — M79674 Pain in right toe(s): Secondary | ICD-10-CM

## 2020-12-06 DIAGNOSIS — M79675 Pain in left toe(s): Secondary | ICD-10-CM

## 2020-12-06 DIAGNOSIS — E119 Type 2 diabetes mellitus without complications: Secondary | ICD-10-CM | POA: Diagnosis not present

## 2020-12-06 NOTE — Progress Notes (Signed)
This patient returns to my office for at risk foot care.  This patient requires this care by a professional since this patient will be at risk due to having diabetes type 2.    This patient is unable to cut nails himself since the patient cannot reach his nails.These nails are painful walking and wearing shoes.  This patient presents for at risk foot care today.  General Appearance  Alert, conversant and in no acute stress.  Vascular  Dorsalis pedis and posterior tibial  pulses are palpable  bilaterally.  Capillary return is within normal limits  bilaterally. Temperature is within normal limits  bilaterally.  Neurologic  [name]-Weinstein monofilament wire test within normal limits  bilaterally. Muscle power within normal limits bilaterally.  Nails Thick disfigured discolored nails with subungual debris  Hallux nails  B/l.Marland Kitchen No evidence of bacterial infection or drainage bilaterally.  Orthopedic  No limitations of motion  feet .  No crepitus or effusions noted.  No bony pathology or digital deformities noted.  Skin  normotropic skin with no porokeratosis noted bilaterally.  No signs of infections or ulcers noted.     Onychomycosis  Pain in right toes  Pain in left toes  Consent was obtained for treatment procedures.   Mechanical debridement of nails 1-5  bilaterally performed with a nail nipper.  Filed with dremel without incident.    Return office visit  4 months                    Told patient to return for periodic foot care and evaluation due to potential at risk complications.   Gardiner Barefoot DPM

## 2020-12-08 ENCOUNTER — Other Ambulatory Visit: Payer: Self-pay | Admitting: Internal Medicine

## 2020-12-09 ENCOUNTER — Encounter: Payer: Self-pay | Admitting: Oncology

## 2020-12-09 ENCOUNTER — Other Ambulatory Visit: Payer: Self-pay

## 2020-12-09 ENCOUNTER — Encounter
Admission: RE | Admit: 2020-12-09 | Discharge: 2020-12-09 | Disposition: A | Discharge status: Home / Self Care | Payer: Medicare Other | Source: Ambulatory Visit | Attending: Oncology | Admitting: Oncology

## 2020-12-09 DIAGNOSIS — C7931 Secondary malignant neoplasm of brain: Secondary | ICD-10-CM

## 2020-12-09 DIAGNOSIS — Z79899 Other long term (current) drug therapy: Secondary | ICD-10-CM | POA: Diagnosis not present

## 2020-12-09 DIAGNOSIS — C439 Malignant melanoma of skin, unspecified: Secondary | ICD-10-CM

## 2020-12-09 LAB — GLUCOSE, CAPILLARY: Glucose-Capillary: 88 mg/dL (ref 70–99)

## 2020-12-09 MED ORDER — FLUDEOXYGLUCOSE F - 18 (FDG) INJECTION
10.6000 | Freq: Once | INTRAVENOUS | Status: AC | PRN
  Administered 2020-12-09: 10.7 via INTRAVENOUS

## 2020-12-14 ENCOUNTER — Inpatient Hospital Stay: Payer: Medicare Other | Attending: Oncology

## 2020-12-14 ENCOUNTER — Other Ambulatory Visit: Payer: Self-pay

## 2020-12-14 DIAGNOSIS — C9241 Acute promyelocytic leukemia, in remission: Secondary | ICD-10-CM

## 2020-12-14 DIAGNOSIS — N179 Acute kidney failure, unspecified: Secondary | ICD-10-CM | POA: Diagnosis not present

## 2020-12-14 DIAGNOSIS — I10 Essential (primary) hypertension: Secondary | ICD-10-CM | POA: Diagnosis not present

## 2020-12-14 DIAGNOSIS — G9389 Other specified disorders of brain: Secondary | ICD-10-CM | POA: Diagnosis not present

## 2020-12-14 DIAGNOSIS — Z66 Do not resuscitate: Secondary | ICD-10-CM | POA: Diagnosis not present

## 2020-12-14 DIAGNOSIS — I442 Atrioventricular block, complete: Secondary | ICD-10-CM | POA: Diagnosis not present

## 2020-12-14 DIAGNOSIS — R4701 Aphasia: Secondary | ICD-10-CM | POA: Diagnosis not present

## 2020-12-14 DIAGNOSIS — C7801 Secondary malignant neoplasm of right lung: Secondary | ICD-10-CM | POA: Diagnosis not present

## 2020-12-14 DIAGNOSIS — Z8673 Personal history of transient ischemic attack (TIA), and cerebral infarction without residual deficits: Secondary | ICD-10-CM | POA: Diagnosis not present

## 2020-12-14 DIAGNOSIS — R569 Unspecified convulsions: Secondary | ICD-10-CM | POA: Diagnosis not present

## 2020-12-14 DIAGNOSIS — Z8616 Personal history of COVID-19: Secondary | ICD-10-CM | POA: Diagnosis not present

## 2020-12-14 LAB — COMPREHENSIVE METABOLIC PANEL
ALT: 14 U/L (ref 0–44)
AST: 25 U/L (ref 15–41)
Albumin: 4.2 g/dL (ref 3.5–5.0)
Alkaline Phosphatase: 54 U/L (ref 38–126)
Anion gap: 11 (ref 5–15)
BUN: 48 mg/dL — ABNORMAL HIGH (ref 8–23)
CO2: 24 mmol/L (ref 22–32)
Calcium: 9 mg/dL (ref 8.9–10.3)
Chloride: 103 mmol/L (ref 98–111)
Creatinine, Ser: 2.29 mg/dL — ABNORMAL HIGH (ref 0.61–1.24)
GFR, Estimated: 28 mL/min — ABNORMAL LOW (ref >60)
Glucose, Bld: 126 mg/dL — ABNORMAL HIGH (ref 70–99)
Potassium: 4.6 mmol/L (ref 3.5–5.1)
Sodium: 138 mmol/L (ref 135–145)
Total Bilirubin: 0.6 mg/dL (ref 0.3–1.2)
Total Protein: 7.3 g/dL (ref 6.5–8.1)

## 2020-12-14 LAB — CBC WITH DIFFERENTIAL/PLATELET
Abs Immature Granulocytes: 0.03 10*3/uL (ref 0.00–0.07)
Basophils Absolute: 0 10*3/uL (ref 0.0–0.1)
Basophils Relative: 0 %
Eosinophils Absolute: 0.3 10*3/uL (ref 0.0–0.5)
Eosinophils Relative: 5 %
HCT: 47.2 % (ref 39.0–52.0)
Hemoglobin: 15.6 g/dL (ref 13.0–17.0)
Immature Granulocytes: 1 %
Lymphocytes Relative: 15 %
Lymphs Abs: 0.8 10*3/uL (ref 0.7–4.0)
MCH: 31.6 pg (ref 26.0–34.0)
MCHC: 33.1 g/dL (ref 30.0–36.0)
MCV: 95.5 fL (ref 80.0–100.0)
Monocytes Absolute: 0.4 10*3/uL (ref 0.1–1.0)
Monocytes Relative: 7 %
Neutro Abs: 3.6 10*3/uL (ref 1.7–7.7)
Neutrophils Relative %: 72 %
Platelets: 169 10*3/uL (ref 150–400)
RBC: 4.94 MIL/uL (ref 4.22–5.81)
RDW: 14 % (ref 11.5–15.5)
WBC: 5.1 10*3/uL (ref 4.0–10.5)
nRBC: 0 % (ref 0.0–0.2)

## 2020-12-15 ENCOUNTER — Observation Stay: Payer: Medicare Other

## 2020-12-15 ENCOUNTER — Other Ambulatory Visit: Payer: Self-pay

## 2020-12-15 ENCOUNTER — Inpatient Hospital Stay
Admission: EM | Admit: 2020-12-15 | Discharge: 2020-12-18 | DRG: 101 | Disposition: A | Discharge status: Other Acute Inpatient Hospital | Payer: Medicare Other | Attending: Internal Medicine | Admitting: Internal Medicine

## 2020-12-15 ENCOUNTER — Telehealth: Payer: Self-pay | Admitting: *Deleted

## 2020-12-15 ENCOUNTER — Emergency Department: Payer: Medicare Other

## 2020-12-15 DIAGNOSIS — Z79899 Other long term (current) drug therapy: Secondary | ICD-10-CM

## 2020-12-15 DIAGNOSIS — I252 Old myocardial infarction: Secondary | ICD-10-CM

## 2020-12-15 DIAGNOSIS — C9241 Acute promyelocytic leukemia, in remission: Secondary | ICD-10-CM | POA: Diagnosis present

## 2020-12-15 DIAGNOSIS — N400 Enlarged prostate without lower urinary tract symptoms: Secondary | ICD-10-CM | POA: Diagnosis present

## 2020-12-15 DIAGNOSIS — R413 Other amnesia: Secondary | ICD-10-CM | POA: Diagnosis present

## 2020-12-15 DIAGNOSIS — S069X9S Unspecified intracranial injury with loss of consciousness of unspecified duration, sequela: Secondary | ICD-10-CM

## 2020-12-15 DIAGNOSIS — E785 Hyperlipidemia, unspecified: Secondary | ICD-10-CM | POA: Diagnosis present

## 2020-12-15 DIAGNOSIS — Z8249 Family history of ischemic heart disease and other diseases of the circulatory system: Secondary | ICD-10-CM

## 2020-12-15 DIAGNOSIS — Z87891 Personal history of nicotine dependence: Secondary | ICD-10-CM

## 2020-12-15 DIAGNOSIS — I255 Ischemic cardiomyopathy: Secondary | ICD-10-CM | POA: Diagnosis present

## 2020-12-15 DIAGNOSIS — Z95810 Presence of automatic (implantable) cardiac defibrillator: Secondary | ICD-10-CM

## 2020-12-15 DIAGNOSIS — R410 Disorientation, unspecified: Secondary | ICD-10-CM | POA: Diagnosis not present

## 2020-12-15 DIAGNOSIS — S069XAS Unspecified intracranial injury with loss of consciousness status unknown, sequela: Secondary | ICD-10-CM

## 2020-12-15 DIAGNOSIS — I442 Atrioventricular block, complete: Secondary | ICD-10-CM | POA: Diagnosis present

## 2020-12-15 DIAGNOSIS — G9389 Other specified disorders of brain: Secondary | ICD-10-CM | POA: Diagnosis not present

## 2020-12-15 DIAGNOSIS — R4182 Altered mental status, unspecified: Secondary | ICD-10-CM | POA: Diagnosis not present

## 2020-12-15 DIAGNOSIS — Z7982 Long term (current) use of aspirin: Secondary | ICD-10-CM

## 2020-12-15 DIAGNOSIS — I69220 Aphasia following other nontraumatic intracranial hemorrhage: Secondary | ICD-10-CM

## 2020-12-15 DIAGNOSIS — I10 Essential (primary) hypertension: Secondary | ICD-10-CM | POA: Diagnosis present

## 2020-12-15 DIAGNOSIS — C7931 Secondary malignant neoplasm of brain: Secondary | ICD-10-CM | POA: Diagnosis present

## 2020-12-15 DIAGNOSIS — C61 Malignant neoplasm of prostate: Secondary | ICD-10-CM | POA: Diagnosis present

## 2020-12-15 DIAGNOSIS — N183 Chronic kidney disease, stage 3 unspecified: Secondary | ICD-10-CM | POA: Diagnosis present

## 2020-12-15 DIAGNOSIS — G4733 Obstructive sleep apnea (adult) (pediatric): Secondary | ICD-10-CM | POA: Diagnosis present

## 2020-12-15 DIAGNOSIS — G459 Transient cerebral ischemic attack, unspecified: Secondary | ICD-10-CM | POA: Diagnosis not present

## 2020-12-15 DIAGNOSIS — I619 Nontraumatic intracerebral hemorrhage, unspecified: Secondary | ICD-10-CM | POA: Diagnosis present

## 2020-12-15 DIAGNOSIS — Z66 Do not resuscitate: Secondary | ICD-10-CM | POA: Diagnosis present

## 2020-12-15 DIAGNOSIS — Z801 Family history of malignant neoplasm of trachea, bronchus and lung: Secondary | ICD-10-CM

## 2020-12-15 DIAGNOSIS — I129 Hypertensive chronic kidney disease with stage 1 through stage 4 chronic kidney disease, or unspecified chronic kidney disease: Secondary | ICD-10-CM | POA: Diagnosis present

## 2020-12-15 DIAGNOSIS — I69151 Hemiplegia and hemiparesis following nontraumatic intracerebral hemorrhage affecting right dominant side: Secondary | ICD-10-CM

## 2020-12-15 DIAGNOSIS — N1832 Chronic kidney disease, stage 3b: Secondary | ICD-10-CM | POA: Diagnosis present

## 2020-12-15 DIAGNOSIS — Z85820 Personal history of malignant melanoma of skin: Secondary | ICD-10-CM

## 2020-12-15 DIAGNOSIS — R569 Unspecified convulsions: Secondary | ICD-10-CM | POA: Diagnosis not present

## 2020-12-15 DIAGNOSIS — Z8261 Family history of arthritis: Secondary | ICD-10-CM

## 2020-12-15 DIAGNOSIS — Z951 Presence of aortocoronary bypass graft: Secondary | ICD-10-CM

## 2020-12-15 DIAGNOSIS — Z8546 Personal history of malignant neoplasm of prostate: Secondary | ICD-10-CM

## 2020-12-15 DIAGNOSIS — N179 Acute kidney failure, unspecified: Secondary | ICD-10-CM | POA: Diagnosis present

## 2020-12-15 DIAGNOSIS — R4701 Aphasia: Secondary | ICD-10-CM

## 2020-12-15 DIAGNOSIS — C7800 Secondary malignant neoplasm of unspecified lung: Secondary | ICD-10-CM | POA: Diagnosis present

## 2020-12-15 DIAGNOSIS — E039 Hypothyroidism, unspecified: Secondary | ICD-10-CM | POA: Diagnosis present

## 2020-12-15 DIAGNOSIS — F09 Unspecified mental disorder due to known physiological condition: Secondary | ICD-10-CM

## 2020-12-15 DIAGNOSIS — Z8673 Personal history of transient ischemic attack (TIA), and cerebral infarction without residual deficits: Secondary | ICD-10-CM | POA: Diagnosis not present

## 2020-12-15 DIAGNOSIS — Z79890 Hormone replacement therapy (postmenopausal): Secondary | ICD-10-CM

## 2020-12-15 DIAGNOSIS — C7801 Secondary malignant neoplasm of right lung: Secondary | ICD-10-CM | POA: Diagnosis present

## 2020-12-15 DIAGNOSIS — I69251 Hemiplegia and hemiparesis following other nontraumatic intracranial hemorrhage affecting right dominant side: Secondary | ICD-10-CM

## 2020-12-15 DIAGNOSIS — E7800 Pure hypercholesterolemia, unspecified: Secondary | ICD-10-CM | POA: Diagnosis present

## 2020-12-15 DIAGNOSIS — E291 Testicular hypofunction: Secondary | ICD-10-CM | POA: Diagnosis present

## 2020-12-15 DIAGNOSIS — F331 Major depressive disorder, recurrent, moderate: Secondary | ICD-10-CM | POA: Diagnosis present

## 2020-12-15 DIAGNOSIS — R918 Other nonspecific abnormal finding of lung field: Secondary | ICD-10-CM | POA: Diagnosis present

## 2020-12-15 DIAGNOSIS — I25118 Atherosclerotic heart disease of native coronary artery with other forms of angina pectoris: Secondary | ICD-10-CM | POA: Diagnosis present

## 2020-12-15 DIAGNOSIS — C439 Malignant melanoma of skin, unspecified: Secondary | ICD-10-CM | POA: Diagnosis present

## 2020-12-15 DIAGNOSIS — I69120 Aphasia following nontraumatic intracerebral hemorrhage: Secondary | ICD-10-CM

## 2020-12-15 DIAGNOSIS — M109 Gout, unspecified: Secondary | ICD-10-CM | POA: Diagnosis present

## 2020-12-15 DIAGNOSIS — K219 Gastro-esophageal reflux disease without esophagitis: Secondary | ICD-10-CM | POA: Diagnosis present

## 2020-12-15 DIAGNOSIS — Z8616 Personal history of COVID-19: Secondary | ICD-10-CM

## 2020-12-15 LAB — URINALYSIS, ROUTINE W REFLEX MICROSCOPIC
Bacteria, UA: NONE SEEN
Bilirubin Urine: NEGATIVE
Glucose, UA: NEGATIVE mg/dL
Ketones, ur: NEGATIVE mg/dL
Leukocytes,Ua: NEGATIVE
Nitrite: NEGATIVE
Protein, ur: 300 mg/dL — AB
Specific Gravity, Urine: 1.02 (ref 1.005–1.030)
Squamous Epithelial / HPF: NONE SEEN (ref 0–5)
pH: 6.5 (ref 5.0–8.0)

## 2020-12-15 LAB — RESP PANEL BY RT-PCR (FLU A&B, COVID) ARPGX2
Influenza A by PCR: NEGATIVE
Influenza B by PCR: NEGATIVE
SARS Coronavirus 2 by RT PCR: NEGATIVE

## 2020-12-15 LAB — CBC
HCT: 46.5 % (ref 39.0–52.0)
Hemoglobin: 16.3 g/dL (ref 13.0–17.0)
MCH: 32.3 pg (ref 26.0–34.0)
MCHC: 35.1 g/dL (ref 30.0–36.0)
MCV: 92.1 fL (ref 80.0–100.0)
Platelets: 168 10*3/uL (ref 150–400)
RBC: 5.05 MIL/uL (ref 4.22–5.81)
RDW: 14 % (ref 11.5–15.5)
WBC: 4.9 10*3/uL (ref 4.0–10.5)
nRBC: 0 % (ref 0.0–0.2)

## 2020-12-15 LAB — DIFFERENTIAL
Abs Immature Granulocytes: 0.02 10*3/uL (ref 0.00–0.07)
Basophils Absolute: 0 10*3/uL (ref 0.0–0.1)
Basophils Relative: 0 %
Eosinophils Absolute: 0.3 10*3/uL (ref 0.0–0.5)
Eosinophils Relative: 5 %
Immature Granulocytes: 0 %
Lymphocytes Relative: 12 %
Lymphs Abs: 0.6 10*3/uL — ABNORMAL LOW (ref 0.7–4.0)
Monocytes Absolute: 0.3 10*3/uL (ref 0.1–1.0)
Monocytes Relative: 7 %
Neutro Abs: 3.7 10*3/uL (ref 1.7–7.7)
Neutrophils Relative %: 76 %

## 2020-12-15 LAB — COMPREHENSIVE METABOLIC PANEL
ALT: 14 U/L (ref 0–44)
AST: 25 U/L (ref 15–41)
Albumin: 4.3 g/dL (ref 3.5–5.0)
Alkaline Phosphatase: 51 U/L (ref 38–126)
Anion gap: 10 (ref 5–15)
BUN: 43 mg/dL — ABNORMAL HIGH (ref 8–23)
CO2: 24 mmol/L (ref 22–32)
Calcium: 9.3 mg/dL (ref 8.9–10.3)
Chloride: 103 mmol/L (ref 98–111)
Creatinine, Ser: 2.07 mg/dL — ABNORMAL HIGH (ref 0.61–1.24)
GFR, Estimated: 32 mL/min — ABNORMAL LOW (ref >60)
Glucose, Bld: 133 mg/dL — ABNORMAL HIGH (ref 70–99)
Potassium: 4.8 mmol/L (ref 3.5–5.1)
Sodium: 137 mmol/L (ref 135–145)
Total Bilirubin: 0.7 mg/dL (ref 0.3–1.2)
Total Protein: 6.9 g/dL (ref 6.5–8.1)

## 2020-12-15 LAB — AMMONIA: Ammonia: 17 umol/L (ref 9–35)

## 2020-12-15 LAB — PROTIME-INR
INR: 1 (ref 0.8–1.2)
Prothrombin Time: 12.9 seconds (ref 11.4–15.2)

## 2020-12-15 LAB — CREATININE, SERUM
Creatinine, Ser: 2.02 mg/dL — ABNORMAL HIGH (ref 0.61–1.24)
GFR, Estimated: 33 mL/min — ABNORMAL LOW (ref >60)

## 2020-12-15 LAB — FIBRINOGEN: Fibrinogen: 456 mg/dL (ref 210–475)

## 2020-12-15 LAB — APTT: aPTT: 36 seconds (ref 24–36)

## 2020-12-15 LAB — VITAMIN B12: Vitamin B-12: 383 pg/mL (ref 180–914)

## 2020-12-15 MED ORDER — TRAZODONE HCL 50 MG PO TABS: 25.0000 mg | ORAL_TABLET | Freq: Every day | ORAL | Status: DC

## 2020-12-15 MED ORDER — ASPIRIN EC 325 MG PO TBEC
325.0000 mg | DELAYED_RELEASE_TABLET | Freq: Once | ORAL | Status: AC
  Administered 2020-12-15: 325 mg via ORAL
  Filled 2020-12-15: qty 1

## 2020-12-15 MED ORDER — STROKE: EARLY STAGES OF RECOVERY BOOK: Freq: Once | Status: AC

## 2020-12-15 MED ORDER — LEVETIRACETAM IN NACL 500 MG/100ML IV SOLN
500.0000 mg | Freq: Once | INTRAVENOUS | Status: DC
  Filled 2020-12-15: qty 100

## 2020-12-15 MED ORDER — METOPROLOL SUCCINATE ER 50 MG PO TB24
75.0000 mg | ORAL_TABLET | Freq: Every day | ORAL | Status: DC
  Administered 2020-12-16 – 2020-12-17 (×2): 75 mg via ORAL
  Filled 2020-12-15 (×2): qty 1

## 2020-12-15 MED ORDER — ACETAMINOPHEN 650 MG RE SUPP: 650.0000 mg | RECTAL | Status: DC | PRN

## 2020-12-15 MED ORDER — LEVETIRACETAM IN NACL 500 MG/100ML IV SOLN
500.0000 mg | Freq: Once | INTRAVENOUS | Status: AC
  Administered 2020-12-15: 500 mg via INTRAVENOUS
  Filled 2020-12-15: qty 100

## 2020-12-15 MED ORDER — ASPIRIN EC 81 MG PO TBEC
81.0000 mg | DELAYED_RELEASE_TABLET | Freq: Every evening | ORAL | Status: DC
  Administered 2020-12-16: 18:00:00 81 mg via ORAL
  Filled 2020-12-15: qty 1

## 2020-12-15 MED ORDER — ROSUVASTATIN CALCIUM 20 MG PO TABS
20.0000 mg | ORAL_TABLET | Freq: Every day | ORAL | Status: DC
  Administered 2020-12-16 – 2020-12-17 (×2): 20 mg via ORAL
  Filled 2020-12-15 (×3): qty 1

## 2020-12-15 MED ORDER — SERTRALINE HCL 50 MG PO TABS
50.0000 mg | ORAL_TABLET | Freq: Every day | ORAL | Status: DC
  Administered 2020-12-16 – 2020-12-17 (×2): 50 mg via ORAL
  Filled 2020-12-15 (×2): qty 1

## 2020-12-15 MED ORDER — ACETAMINOPHEN 325 MG PO TABS: 650.0000 mg | ORAL_TABLET | ORAL | Status: DC | PRN

## 2020-12-15 MED ORDER — DIAZEPAM 5 MG/ML IJ SOLN: 5.0000 mg | Freq: Once | INTRAMUSCULAR | Status: DC | PRN

## 2020-12-15 MED ORDER — SODIUM CHLORIDE 0.9 % IV SOLN
250.0000 mg | Freq: Two times a day (BID) | INTRAVENOUS | Status: DC
  Filled 2020-12-15: qty 2.5

## 2020-12-15 MED ORDER — METHYLPHENIDATE HCL 5 MG PO TABS: 5.0000 mg | ORAL_TABLET | Freq: Two times a day (BID) | ORAL | Status: DC

## 2020-12-15 MED ORDER — SODIUM CHLORIDE 0.9 % IV SOLN
250.0000 mg | Freq: Two times a day (BID) | INTRAVENOUS | Status: DC
  Administered 2020-12-16: 06:00:00 250 mg via INTRAVENOUS
  Filled 2020-12-15 (×2): qty 2.5

## 2020-12-15 MED ORDER — ACETAMINOPHEN 160 MG/5ML PO SOLN
650.0000 mg | ORAL | Status: DC | PRN
  Filled 2020-12-15: qty 20.3

## 2020-12-15 MED ORDER — TRAZODONE HCL 50 MG PO TABS
75.0000 mg | ORAL_TABLET | Freq: Every day | ORAL | Status: DC
  Administered 2020-12-15 – 2020-12-16 (×2): 75 mg via ORAL
  Filled 2020-12-15 (×2): qty 2

## 2020-12-15 MED ORDER — AMLODIPINE BESYLATE 5 MG PO TABS
2.5000 mg | ORAL_TABLET | Freq: Every day | ORAL | Status: DC
  Administered 2020-12-16 – 2020-12-17 (×2): 2.5 mg via ORAL
  Filled 2020-12-15 (×2): qty 1

## 2020-12-15 MED ORDER — ALLOPURINOL 100 MG PO TABS
200.0000 mg | ORAL_TABLET | Freq: Every day | ORAL | Status: DC
  Administered 2020-12-16 – 2020-12-17 (×2): 200 mg via ORAL
  Filled 2020-12-15 (×2): qty 2

## 2020-12-15 MED ORDER — LEVETIRACETAM IN NACL 500 MG/100ML IV SOLN
500.0000 mg | Freq: Once | INTRAVENOUS | Status: DC | PRN
  Filled 2020-12-15: qty 100

## 2020-12-15 MED ORDER — LEVOTHYROXINE SODIUM 137 MCG PO TABS
137.0000 ug | ORAL_TABLET | Freq: Every day | ORAL | Status: DC
  Administered 2020-12-16 – 2020-12-17 (×2): 137 ug via ORAL
  Filled 2020-12-15 (×4): qty 1

## 2020-12-15 MED ORDER — ENOXAPARIN SODIUM 40 MG/0.4ML IJ SOSY
40.0000 mg | PREFILLED_SYRINGE | Freq: Every day | INTRAMUSCULAR | Status: DC
  Administered 2020-12-16 – 2020-12-17 (×2): 40 mg via SUBCUTANEOUS
  Filled 2020-12-15 (×2): qty 0.4

## 2020-12-15 MED ORDER — FUROSEMIDE 20 MG PO TABS
20.0000 mg | ORAL_TABLET | ORAL | Status: DC
  Administered 2020-12-16: 10:00:00 20 mg via ORAL
  Filled 2020-12-15: qty 1

## 2020-12-15 MED ORDER — MECLIZINE HCL 12.5 MG PO TABS
12.5000 mg | ORAL_TABLET | Freq: Three times a day (TID) | ORAL | Status: DC | PRN
  Filled 2020-12-15: qty 1

## 2020-12-15 NOTE — Telephone Encounter (Signed)
Call from family stating that patient had an episode yesterday while in office getting his blood drawn and that he was "checked out by the staff" He is requesting to come in the be checked again today. Please advise

## 2020-12-15 NOTE — ED Triage Notes (Signed)
First Nurse Note:  Arrives from home ([facility]) via [place].  Has history of cva in past and c/o having tia's all night long.  Currently denies symptoms.  Has left leg residual weakness from prior CVA. CBG:  126.  VS wnl

## 2020-12-15 NOTE — ED Notes (Signed)
Lab in with pt now °

## 2020-12-15 NOTE — ED Notes (Signed)
Pt resting comfortably in bed, NAD. No needs verbalized at this time. Bed low & locked, call light & personal items within reach.

## 2020-12-15 NOTE — Telephone Encounter (Signed)
Called pt's daughter to offer [facility] appointment, and she stated that pt was already in the ED.

## 2020-12-15 NOTE — ED Notes (Signed)
Pt ambulated to restroom & back to room independently (stand-by assist).

## 2020-12-15 NOTE — Procedures (Addendum)
Patient Name: David [name].  MRN: [id number]  Epilepsy Attending: Lora Havens  Referring Physician/Provider: Dr Lesleigh Noe Date: 12/15/2020  Duration: 20.35 mins  Patient history: 80 year old male with history of CVA, ICH, melanoma with mets to lung and brain who presented with aphasia.  EEG to evaluate for seizures.  Level of alertness: Awake  AEDs during EEG study: LEV  Technical aspects: This EEG study was done with scalp electrodes positioned according to the 10-20 International system of electrode placement. Electrical activity was acquired at a sampling rate of 500Hz  and reviewed with a high frequency filter of 70Hz  and a low frequency filter of 1Hz . EEG data were recorded continuously and digitally stored.   Description: The posterior dominant rhythm consists of 8 Hz activity of moderate voltage (25-35 uV) seen predominantly in posterior head regions, symmetric and reactive to eye opening and eye closing.  EEG also showed continuous 3 to 5 Hz sharply contoured theta-delta slowing with overriding 15 to 18 Hz beta activity in left frontal region consistent with prior craniotomy.   Seizure was noted arising from left centro-temporal region on 12/15/2020 at 1629. During seizure, patient was noted to be aphasic at the beginning and then got confused and wanted to get out of the bed to urinate. Onset of seizure on 12/15/2020 at 1629, seizure was ongoing when study ended at 1634.  Concomitant EEG initially showed sharply contoured 13 to 14 Hz beta activity in left central temporal region which gradually involved all of left hemisphere followed by right hemisphere and evolved in morphology to 3 to 5 Hz theta- delta slowing.  Physiologic photic driving was not seen during photic stimulation.  Hyperventilation was not performed.     ABNORMALITY -Focal seizure, left centro- temporal region -Breach artifact, left frontal region   IMPRESSION: This study showed one seizure arising from  left central temporal region on 12/15/2020 at 1629, was ongoing when the study ended at 1634.  During seizure, patient was initially not able to respond but then got confused and restless and wanting to get out of bed to urinate.  EEG also showed evidence of cortical dysfunction in left frontal region consistent with prior craniotomy.  If concern for ictal-interictal activity persist, recommend long-term monitoring.  Dr Curly Shores was notified  Lora Havens

## 2020-12-15 NOTE — ED Provider Notes (Signed)
[facility] Emergency Department Provider Note ____________________________________________   Event Date/Time   First MD Initiated Contact with Patient 12/15/20 1225     (approximate)  I have reviewed the triage vital signs and the nursing notes.   HISTORY  Chief Complaint Weakness and Aphasia  HPI David [name]. is a 80 y.o. male with history of CVA, ICH, melanoma with metastasis to lung and brain presents to the emergency department for treatment and evaluation of aphasia. Symptoms started yesterday, but is unable to recall what time. He also states that he is having difficulty with his memory. He denies fever, headache, vision changes, or other concerns.          Past Medical History:  Diagnosis Date   Anginal pain ([place])    Arthritis    mostly hands   Benign familial hematuria    BPH (benign prostatic hypertrophy)    Brain cancer ([place])    melanoma with brain met   Coronary artery disease    GERD (gastroesophageal reflux disease)    High cholesterol    Hypertension    Hypothyroidism    Intracerebral hemorrhage (HCC)    Melanoma (David [name])    Metastatic melanoma to lung ([place])    Mood disorder (David [name])    Myocardial infarction (David [name])    Pacemaker    due to syncope, 3rd degree HB (upgrade to [employer] CRT-P 02/25/13 (Dr. Uvaldo Rising)   Rectal bleed    due to NSAIDS   Renal disorder    Sepsis ([place]) 12/09/2018   Skin cancer    melanoma   Sleep apnea    uses CPAP   Steroid-induced hyperglycemia    Stroke [facility])     Patient Active Problem List   Diagnosis Date Noted   Confusion 12/15/2020   Microhematuria 10/14/2020   Thin basement membrane disease 10/14/2020   Moderately increased albuminuria 09/11/2020   Pain due to onychomycosis of toenails of both feet 03/24/2019   Presence of biventricular cardiac pacemaker 03/15/2019   DNR (do not resuscitate) 03/15/2019   Intermittent memory loss 01/26/2019   Vitamin D deficiency 01/20/2019   APML  (acute promyelocytic leukemia) in remission (David [name]) 01/08/2019   Pancytopenia (David [name]) 01/06/2019   Steroid-induced hyperglycemia    Gout    Insomnia 10/14/2018   Heart block AV complete (David [name]) 10/01/2018   Hypogonadism in male 04/18/2018   Dandruff in adult 03/27/2018   Dyslipidemia 03/27/2018   Prostate cancer ([place]) 12/24/2017   Hx of CABG 07/17/2017   Alcohol use 04/19/2017   Cognitive and neurobehavioral dysfunction following brain injury ([place]) 02/25/2017   History of implantation of joint prosthesis of elbow 02/12/2017   DJD (degenerative joint disease) 02/12/2017   Nontraumatic cortical hemorrhage of left cerebral hemisphere ([place]) 06/27/2016   Advanced directives, counseling/discussion 05/03/2016   Atherosclerosis of native coronary artery of native heart with stable angina pectoris (David [name]) 04/24/2016   Ischemic cardiomyopathy 04/24/2016   Acquired hypothyroidism 11/24/2015   Metastatic melanoma to lung [facility])    MDD (major depressive disorder), recurrent episode, moderate (HCC)    Benign prostatic hyperplasia    Metastasis to brain ([place]) 10/15/2015   Malignant melanoma (David [name]) 09/22/2015   Lung mass    Chronic kidney disease, stage III (moderate) (HCC)    Obesity, Class I, BMI 30-34.9 08/26/2015   Incontinence 08/26/2015   Hemorrhagic stroke ([place]) 08/26/2015   Hemiplegia and hemiparesis following nontraumatic intracerebral hemorrhage affecting right dominant side (David [name]) 08/26/2015   Aphasia following nontraumatic intracerebral hemorrhage 08/26/2015  Gait disturbance, post-stroke 08/26/2015   Benign essential HTN    OSA on CPAP    Biventricular ICD (implantable cardioverter-defibrillator) in place    ICH (intracerebral hemorrhage) (David [name]) - L frontal due to HTN vs CAA 08/22/2015    Past Surgical History:  Procedure Laterality Date   APPLICATION OF CRANIAL NAVIGATION N/A 10/15/2015   Procedure: APPLICATION OF CRANIAL NAVIGATION;  Surgeon: Erline Levine, MD;  Location: [place] NEURO ORS;   Service: Neurosurgery;  Laterality: N/A;   BIV PACEMAKER GENERATOR CHANGEOUT N/A 04/02/2019   Procedure: BIV PACEMAKER GENERATOR CHANGEOUT;  Surgeon: Evans Lance, MD;  Location: [place] CV LAB;  Service: Cardiovascular;  Laterality: N/A;   CARDIAC CATHETERIZATION  2013   CARDIAC SURGERY     bypass X 2   COLONOSCOPY WITH PROPOFOL N/A 05/08/2016   diverticulosis, int hem, no f/u needed [name])   CORONARY ARTERY BYPASS GRAFT  2013   LIMA-LAD, SVG-PDA 03/27/11 (Dr. Francee Gentile)   [place] N/A 10/15/2015   Procedure: LEFT FRONTAL CRANIOTOMY TUMOR EXCISION with Curve;  Surgeon: Erline Levine, MD;  Location: [place] NEURO ORS;  Service: Neurosurgery;  Laterality: N/A;  CRANIOTOMY TUMOR EXCISION   EYE SURGERY     FOOT SURGERY  08/2010   IR REMOVAL TUN ACCESS W/ PORT W/O FL MOD SED  08/06/2020   JOINT REPLACEMENT Left    partial knee   KNEE ARTHROSCOPY  12/2008   LYMPH NODE BIOPSY     PACEMAKER INSERTION     TONSILLECTOMY      Prior to Admission medications   Medication Sig Start Date End Date Taking? Authorizing Provider  acetaminophen (TYLENOL) 325 MG tablet Take 2 tablets (650 mg total) by mouth every 6 (six) hours as needed for mild pain (or Fever >/= 101). 12/27/18  Yes Angiulli, Lavon Paganini, PA-C  allopurinol (ZYLOPRIM) 100 MG tablet Take 200 mg by mouth daily. 12/02/19  Yes [provider]  aspirin EC 81 MG tablet Take 81 mg by mouth every evening.    Yes [provider]  Cholecalciferol (VITAMIN D3) 25 MCG (1000 UT) CAPS Take 1 capsule (1,000 Units total) by mouth daily. 12/27/18  Yes Angiulli, Lavon Paganini, PA-C  colchicine 0.6 MG tablet Take 1 tablet (0.6 mg total) by mouth daily as needed (gout flare). May take 2 tablets on first day 05/31/20  Yes [name], MD  furosemide (LASIX) 20 MG tablet Take 1 tablet (20 mg total) by mouth every other day. 05/31/20  Yes [name], MD  levothyroxine (SYNTHROID) 137 MCG tablet TAKE 1 TABLET BY MOUTH ONCE DAILY BEFORE BREAKFAST  12/27/18  Yes Angiulli, Lavon Paganini, PA-C  meclizine (ANTIVERT) 12.5 MG tablet Take 1 tablet (12.5 mg total) by mouth 3 (three) times daily as needed for dizziness. 01/13/20  Yes Lesleigh Noe, MD  methylphenidate (RITALIN) 5 MG tablet TAKE 1 TABLET BY MOUTH TWICE DAILY AT BREAKFAST & LUNCH 12/09/20  Yes Vaslow, Acey Lav, MD  metoprolol succinate (TOPROL-XL) 50 MG 24 hr tablet TAKE 1 AND 1/2 TABLET (75 MG) BY MOUTH EVERY DAY WITH OR IMMEDIATELY FOLLOWING A MEAL *DO NOT CRUSH* 12/27/18  Yes Angiulli, Lavon Paganini, PA-C  sertraline (ZOLOFT) 50 MG tablet Take 1 tablet (50 mg total) by mouth daily. Take with breakfast.  Initiate 60m dose after completing 5 days of 219m. 06/11/19  Yes FiLloyd HugerMD  traZODone (DESYREL) 50 MG tablet Take 0.5-1 tablets (25-50 mg total) by mouth at bedtime. 10/14/18  Yes GuRia BushMD  Turmeric 500  MG CAPS Take 2 capsules by mouth daily as needed (joint pain). 05/31/20  Yes [name], MD  amLODipine (NORVASC) 2.5 MG tablet Take 1 tablet (2.5 mg total) by mouth daily. Do not take if dizzy and Systolic BP (top number) is less than 120. 02/27/20 10/05/20  Visser, Jacquelyn D, PA-C  Evolocumab (REPATHA SURECLICK) 496 MG/ML SOAJ Inject 1 pen into the skin every 14 (fourteen) days. 02/27/20   Marrianne Mood D, PA-C  rosuvastatin (CRESTOR) 20 MG tablet Take 1 tablet (20 mg total) by mouth daily. Patient not taking: Reported on 12/15/2020 10/05/20   Minna Merritts, MD  sertraline (ZOLOFT) 100 MG tablet Take by mouth. Patient not taking: Reported on 12/15/2020 11/19/15   [provider]  testosterone cypionate (DEPOTESTOSTERONE CYPIONATE) 200 MG/ML injection INJECT 0.5ML (100MG TOTAL) INTRAMUSCULARLY EVERY 14 DAYS (SINGLE DOSE VIAL) *ONLY GOOD FOR 1 DOSE* *CALL FOR REFILL* 10/13/20   [name], MD    Allergies Ezetimibe and Simvastatin  Family History  Problem Relation Age of Onset   Cancer Mother 76       lung    Hypertension Mother     Hypertension Father    Heart attack Father    Heart disease Father    Rheum arthritis Sister    Cancer Maternal Grandmother     Social History Social History   Tobacco Use   Smoking status: Former    Packs/day: 1.00    Years: 3.00    Pack years: 3.00    Types: Cigarettes    Quit date: 04/10/1957    Years since quitting: 63.7   Smokeless tobacco: Never  Vaping Use   Vaping Use: Never used  Substance Use Topics   Alcohol use: Yes    Comment: maybe 2 beers twice a month    Drug use: No    Review of Systems  Constitutional: No fever/chills Eyes: No visual changes. ENT: No sore throat. Cardiovascular: Denies chest pain. Respiratory: Denies shortness of breath. Gastrointestinal: No abdominal pain.  No nausea, no vomiting.  No diarrhea.  No constipation. Genitourinary: Negative for dysuria. Musculoskeletal: Negative for back pain. Skin: Negative for rash. Neurological: Negative for headaches, focal weakness or numbness. Positive for aphasia. ____________________________________________   PHYSICAL EXAM:  VITAL SIGNS: ED Triage Vitals [12/15/20 1203]  Enc Vitals Group     BP (!) 120/91     Pulse Rate 76     Resp 18     Temp (!) 97.5 F (36.4 C)     Temp Source Oral     SpO2 96 %     Weight 200 lb (90.7 kg)     Height _0  (1.727 m)     Head Circumference      Peak Flow      Pain Score 0     Pain Loc      Pain Edu?      Excl. in [place]?     Constitutional: Alert and oriented. Overall well appearing and in no acute distress. Eyes: Conjunctivae are normal. PERRL. EOMI. Head: Atraumatic. Nose: No congestion/rhinnorhea. Mouth/Throat: Mucous membranes are moist.  Oropharynx non-erythematous. Neck: No stridor.   Hematological/Lymphatic/Immunilogical: No cervical lymphadenopathy. Cardiovascular: Normal rate, regular rhythm. Grossly normal heart sounds.  Good peripheral circulation. Respiratory: Normal respiratory effort.  No retractions. Lungs  CTAB. Gastrointestinal: Soft and nontender. No distention. No abdominal bruits. No CVA tenderness. Genitourinary:  Musculoskeletal: No lower extremity tenderness nor edema.  No joint effusions. Neurologic:  Stuttered, pressured. No gross focal neurologic  deficits are appreciated. No gait instability. Skin:  Skin is warm, dry and intact. No rash noted. Psychiatric: Mood and affect are normal. Speech and behavior are normal.  ____________________________________________   LABS (all labs ordered are listed, but only abnormal results are displayed)  Labs Reviewed  DIFFERENTIAL - Abnormal; Notable for the following components:      Result Value   Lymphs Abs 0.6 (*)    All other components within normal limits  COMPREHENSIVE METABOLIC PANEL - Abnormal; Notable for the following components:   Glucose, Bld 133 (*)    BUN 43 (*)    Creatinine, Ser 2.07 (*)    GFR, Estimated 32 (*)    All other components within normal limits  CREATININE, SERUM - Abnormal; Notable for the following components:   Creatinine, Ser 2.02 (*)    GFR, Estimated 33 (*)    All other components within normal limits  URINALYSIS, ROUTINE W REFLEX MICROSCOPIC - Abnormal; Notable for the following components:   Color, Urine YELLOW (*)    APPearance CLEAR (*)    Hgb urine dipstick MODERATE (*)    Protein, ur 300 (*)    All other components within normal limits  RESP PANEL BY RT-PCR (FLU A&B, COVID) ARPGX2  PROTIME-INR  APTT  CBC  AMMONIA  FIBRINOGEN  VITAMIN B12  VITAMIN B1  HEMOGLOBIN A1C  LIPID PANEL  BASIC METABOLIC PANEL  CBC  CBG MONITORING, ED   ____________________________________________  EKG  ED ECG REPORT I, Daveon Arpino, FNP-BC personally viewed and interpreted this ECG.   Date: 12/15/2020  EKG Time: 1209  Rate: 75  Rhythm: dual paced  Intervals:none  ST&T Change: no ST elevation  ____________________________________________  RADIOLOGY  ED MD interpretation:    CT head negative  for acute findings.  I, Sherrie George, personally viewed and evaluated these images (plain radiographs) as part of my medical decision making, as well as reviewing the written report by the radiologist.  Official radiology report(s): CT HEAD WO CONTRAST  Result Date: 12/15/2020 CLINICAL DATA:  History of CVA, concern for TIAs EXAM: CT HEAD WITHOUT CONTRAST TECHNIQUE: Contiguous axial images were obtained from the base of the skull through the vertex without intravenous contrast. COMPARISON:  11/22/2020 FINDINGS: Brain: Status post left frontal craniotomy with subjacent encephalomalacia. Unchanged appearance of the resection cavity. Unchanged ex vacuo dilatation of the frontal horn of the left lateral ventricle. Evidence of acute infarction, hemorrhage, hydrocephalus, extra-axial collection, mass, mass effect, or midline shift. Vascular: No hyperdense vessel or unexpected calcification. Skull: Prior left frontal craniotomy. No aggressive osseous lesions. Sinuses/Orbits: Left scleral band. Status post bilateral lens replacements. Normal paranasal sinuses. Other: The mastoids are well aerated. IMPRESSION: No acute intracranial process. Electronically Signed   By: Merilyn Baba M.D.   On: 12/15/2020 13:46   DG Chest Port 1 View  Result Date: 12/15/2020 CLINICAL DATA:  Altered mental status. EXAM: PORTABLE CHEST 1 VIEW COMPARISON:  Comparison made with January 08, 2019. FINDINGS: Three lead cardiac pacer device, power pack over LEFT chest, leads project over the heart as on the previous study. Signs of median sternotomy for CABG. Trachea midline. Cardiomediastinal contours and hilar structures are stable. No lobar consolidation. Linear opacities overlie the RIGHT and LEFT hemidiaphragm bilaterally. No visible pneumothorax. No overt sign of effusion. On limited assessment there is no acute skeletal process. EKG leads project over the chest. IMPRESSION: Linear opacities overlie the RIGHT and LEFT hemidiaphragm  bilaterally, likely atelectasis. No consolidation, effusion or edema. Electronically Signed  By: Zetta Bills M.D.   On: 12/15/2020 16:18   EEG adult  Result Date: 12/15/2020 Lora Havens, MD     12/15/2020  4:56 PM Patient Name: David [name]. MRN: [id number] Epilepsy Attending: Lora Havens Referring Physician/Provider: Dr Lesleigh Noe Date: 12/15/2020 Duration: 20.35 mins Patient history: 80 year old male with history of CVA, ICH, melanoma with mets to lung and brain who presented with aphasia.  EEG to evaluate for seizures. Level of alertness: Awake AEDs during EEG study: LEV Technical aspects: This EEG study was done with scalp electrodes positioned according to the 10-20 International system of electrode placement. Electrical activity was acquired at a sampling rate of _0  and reviewed with a high frequency filter of _1  and a low frequency filter of _2 . EEG data were recorded continuously and digitally stored. Description: The posterior dominant rhythm consists of 8 Hz activity of moderate voltage (25-35 uV) seen predominantly in posterior head regions, symmetric and reactive to eye opening and eye closing.  EEG also showed continuous 3 to 5 Hz sharply contoured theta-delta slowing with overriding 15 to 18 Hz beta activity in left frontal region consistent with prior craniotomy. Seizure was noted arising from left centro-temporal region on 12/15/2020 at 1629. During seizure, patient was noted to be aphasic at the beginning and then got confused and wanted to get out of the bed to urinate. Onset of seizure on 12/15/2020 at 1629, seizure was ongoing when study ended at 1634.  Concomitant EEG initially showed sharply contoured 13 to 14 Hz beta activity in left central temporal region which gradually involved all of left hemisphere followed by right hemisphere and evolved in morphology to 3 to 5 Hz theta- delta slowing.  Physiologic photic driving was not seen during photic stimulation.   Hyperventilation and photic stimulation were not performed.   ABNORMALITY -Focal seizure, left centro- temporal region -Breach artifact, left frontal region IMPRESSION: This study showed one seizure arising from left central temporal region on 12/15/2020 at 1629, was ongoing when the study ended at 1634.  During seizure, patient was initially not able to respond but then got confused and restless and wanting to get out of bed to urinate.  EEG also showed evidence of cortical dysfunction in left frontal region consistent with prior craniotomy. If concern for ictal-interictal activity persist, recommend long-term monitoring. Dr Curly Shores was notified Lora Havens    ____________________________________________   PROCEDURES  Procedure(s) performed (including Critical Care):  Procedures  ____________________________________________   INITIAL IMPRESSION / ASSESSMENT AND PLAN     80 year male presents to the ER with aphasia that started yesterday.   DIFFERENTIAL DIAGNOSIS  TIA/CVA/progressive metasis  ED COURSE  Assisted to toilet. Patient able to ambulate with his cane without additional assistance.  Labs are overall reassuring.  He does have stage III kidney disease and BUN and creatinine are near patient's baseline.  CT of his head shows no acute concerns.  Plan will be to admit him under observation and get a neurology consult.     ___________________________________________   FINAL CLINICAL IMPRESSION(S) / ED DIAGNOSES  Final diagnoses:  Aphasia     ED Discharge Orders     None        Mikel Hardgrove. was evaluated in Emergency Department on 12/15/2020 for the symptoms described in the history of present illness. He was evaluated in the context of the global COVID-19 pandemic, which necessitated consideration that the patient might be at risk for infection with the  SARS-CoV-2 virus that causes COVID-19. Institutional protocols and algorithms that pertain to the  evaluation of patients at risk for COVID-19 are in a state of rapid change based on information released by regulatory bodies including the CDC and federal and state organizations. These policies and algorithms were followed during the patient's care in the ED.   Note:  This document was prepared using Dragon voice recognition software and may include unintentional dictation errors.    Victorino Dike, FNP 12/15/20 Patrecia Pour    Carrie Mew, MD 12/16/20 [phone]

## 2020-12-15 NOTE — ED Notes (Signed)
Pt placed back on cardiac, BP, pulse ox monitors. Pt resting comfortably in bed, NAD. No needs verbalized at this time. Bed low & locked; call light & personal items within reach.

## 2020-12-15 NOTE — Consult Note (Signed)
Neurology Consultation Reason for Consult: Aphasia Requesting Physician: Amy Cox  CC: Speech difficulty   History is obtained from: Daughter via phone and patient (limited by mild to moderate aphasia)  HPI: David Barga. is a 80 y.o. right-handed male with past medical history significant for melanoma with metastasis to left frontal lobe complicated by hemorrhage in 2017, APML, hypertension, hypercholesterolemia, sleep apnea, CAD s/p CABG (2012), AV node dysfunction s/p pacemaker.   Per history largely provided by daughter with key details corroborated by patient, he has been in his normal state of health lately.  He was at a routine appointment for blood work for monitoring his CML on 9/6 when he had a brief episode of speech difficulty which quickly resolved.  His vitals and exam were felt to be stable and he returned home; he was reluctant to go to the ED for further evaluation at that time.  The caregiver did check on him via phone throughout the day and he reported he was doing just fine.  However the next morning when she arrived for her shift he reported he had had 3 additional episodes of speech difficulty and did want to be evaluated further.  He then had an episode she witnessed where he had difficulty letting go of his fork as well as speech arrest.  He had an intracerebral hemorrhage on 08/22/2015 with aphasia and there was concern for hypertensive bleed versus TIA with subsequent work-up demonstrating metastatic cancer and repeat head CT on 09/16/2015 showing a solitary melanoma metastasis in the left frontal region  Follows with Dr. Mickeal Skinner last seen 11/29/2020 at which time he was felt to be clinically and radiographically stable with chronic encephalopathy secondary to atrophy and leukomalacia secondary to brain surgery and radiation, plan for 42-monthfollow-up.  His examination at the time was notable for age advanced psychomotor slowing and limited insight at times but otherwise  normal mental status, normal cranial nerves, mild right leg weakness 4+/5 flexors greater than extensors and subtle atrophy in the left proximal hip girdle musculature with a mildly hemiparetic gait.  LKW: 9/6 ~11 AM tPA given?: No, out of the window  Premorbid modified rankin scale:     1 - No significant disability. Able to carry out all usual activities, despite some symptoms.  ROS: Limited by aphasia but negative per daughter and based on what I was able to elicit  Past Medical History:  Diagnosis Date   Anginal pain ([facility]    Arthritis    mostly hands   Benign familial hematuria    BPH (benign prostatic hypertrophy)    Brain cancer (HCC)    melanoma with brain met   Coronary artery disease    GERD (gastroesophageal reflux disease)    High cholesterol    Hypertension    Hypothyroidism    Intracerebral hemorrhage (HCC)    Melanoma ([facility]    Metastatic melanoma to lung (HCC)    Mood disorder [place]    [facility]ocardial infarction ([facility]    Pacemaker    due to syncope, 3rd degree HB (upgrade to [employer] CRT-P 02/25/13 (Dr. BUvaldo Rising   Rectal bleed    due to NSAIDS   Renal disorder    [place] 12[place]   Skin cancer    melanoma   Sleep apnea    uses CPAP   Steroid-induced hyperglycemia    Stroke ([facility]    Past Surgical History:  Procedure Laterality Date   APPLICATION OF CRANIAL NAVIGATION N/A 10/15/2015   Procedure:  APPLICATION OF CRANIAL NAVIGATION;  Surgeon: Erline Levine, MD;  Location: [place] NEURO ORS;  Service: Neurosurgery;  Laterality: N/A;   BIV PACEMAKER GENERATOR CHANGEOUT N/A 04/02/2019   Procedure: BIV PACEMAKER GENERATOR CHANGEOUT;  Surgeon: Evans Lance, MD;  Location: [place] CV LAB;  Service: Cardiovascular;  Laterality: N/A;   CARDIAC CATHETERIZATION  2013   CARDIAC SURGERY     bypass X 2   COLONOSCOPY WITH PROPOFOL N/A 05/08/2016   diverticulosis, int hem, no f/u needed David [name])   CORONARY ARTERY BYPASS GRAFT  2013   LIMA-LAD, SVG-PDA 03/27/11 (Dr.  Francee Gentile)   [place] N/A 10/15/2015   Procedure: LEFT FRONTAL CRANIOTOMY TUMOR EXCISION with Curve;  Surgeon: Erline Levine, MD;  Location: [place] NEURO ORS;  Service: Neurosurgery;  Laterality: N/A;  CRANIOTOMY TUMOR EXCISION   EYE SURGERY     FOOT SURGERY  08/2010   IR REMOVAL TUN ACCESS W/ PORT W/O FL MOD SED  08/06/2020   JOINT REPLACEMENT Left    partial knee   KNEE ARTHROSCOPY  12/2008   LYMPH NODE BIOPSY     PACEMAKER INSERTION     TONSILLECTOMY     Current Meds  Medication Sig   acetaminophen (TYLENOL) 325 MG tablet Take 2 tablets (650 mg total) by mouth every 6 (six) hours as needed for mild pain (or Fever >/= 101).   allopurinol (ZYLOPRIM) 100 MG tablet Take 200 mg by mouth daily.   aspirin EC 81 MG tablet Take 81 mg by mouth every evening.    Cholecalciferol (VITAMIN D3) 25 MCG (1000 UT) CAPS Take 1 capsule (1,000 Units total) by mouth daily.   colchicine 0.6 MG tablet Take 1 tablet (0.6 mg total) by mouth daily as needed (gout flare). May take 2 tablets on first day   furosemide (LASIX) 20 MG tablet Take 1 tablet (20 mg total) by mouth every other day.   levothyroxine (SYNTHROID) 137 MCG tablet TAKE 1 TABLET BY MOUTH ONCE DAILY BEFORE BREAKFAST   meclizine (ANTIVERT) 12.5 MG tablet Take 1 tablet (12.5 mg total) by mouth 3 (three) times daily as needed for dizziness.   methylphenidate (RITALIN) 5 MG tablet TAKE 1 TABLET BY MOUTH TWICE DAILY AT BREAKFAST & LUNCH   metoprolol succinate (TOPROL-XL) 50 MG 24 hr tablet TAKE 1 AND 1/2 TABLET (75 MG) BY MOUTH EVERY DAY WITH OR IMMEDIATELY FOLLOWING A MEAL *DO NOT CRUSH*   sertraline (ZOLOFT) 50 MG tablet Take 1 tablet (50 mg total) by mouth daily. Take with breakfast.  Initiate 25m dose after completing 5 days of 226m.   traZODone (DESYREL) 50 MG tablet Take 0.5-1 tablets (25-50 mg total) by mouth at bedtime.   Turmeric 500 MG CAPS Take 2 capsules by mouth daily as needed (joint pain).     Family History  Problem Relation Age of Onset    Cancer Mother [id number]     lung    Hypertension Mother    Hypertension Father    Heart attack Father    Heart disease Father    Rheum arthritis Sister    Cancer Maternal Grandmother     Social History:  reports that he quit smoking about 63 years ago. His smoking use included cigarettes. He has a 3.00 pack-year smoking history. He has never used smokeless tobacco. He reports current alcohol use. He reports that he does not use drugs.   Exam: Current vital signs: BP (!) 113/94   Pulse 74   Temp (!) 97.5 F (36.4 C) (  Oral)   Resp 15   Ht 5' 8"  (1.727 m)   Wt 90.7 kg   SpO2 94%   BMI 30.41 kg/m  Vital signs in last 24 hours: Temp:  [97.5 F (36.4 C)] 97.5 F (36.4 C) (09/07 1203) Pulse Rate:  [74-76] 74 (09/07 1300) Resp:  [15-18] 15 (09/07 1300) BP: (113-120)/(91-94) 113/94 (09/07 1230) SpO2:  [94 %-96 %] 94 % (09/07 1300) Weight:  [90.7 kg] 90.7 kg (09/07 1203)   Physical Exam  Constitutional: Appears well-developed and well-nourished.  Psych: Affect appropriate to situation, calm and cooperative, occasionally slightly frustrated by mild aphasia Eyes: No scleral injection HENT: No oropharyngeal obstruction.  Multiple crowns MSK: no joint deformities.  Cardiovascular: Normal rate and regular rhythm.  Respiratory: Effort normal, non-labored breathing GI: Soft.  No distension. There is no tenderness.  Skin: Warm dry and intact visible skin  Neuro: Mental Status: Patient is awake, alert, oriented to person, place, month, year, and situation. Patient is able to give some history but limited by his aphasia.  For example he states he had something on his skin that went his brain and cannot state the word melanoma.  He is able to name and repeat simple objects with some slight hesitancy.  No signs of neglect Cranial Nerves: II: Visual Fields are full. Pupils are equal, mildly irregular, but briskly reactive to light.   III,IV, VI: EOMI with mild bilateral ptosis.  V: Facial  sensation is symmetric to light touch VII: Facial movement is symmetric on activation with a slight right nasolabial fold flattening at rest.  VIII: hearing is intact to voice X: Uvula elevates symmetrically XI: Shoulder shrug is symmetric. XII: tongue is midline without atrophy or fasciculations.  Motor: Tone is normal. Bulk is normal. 5/5 strength was present in all four extremities.  Slight pronation of the right upper extremity with a parietal (upwards) drift Sensory: Sensation is symmetric to light touch and temperature in the arms and legs. Deep Tendon Reflexes: 2+ and symmetric in the biceps and patellae.  Cerebellar: FNF and HKS are intact bilaterally Gait Hemiparetic but stable and easily able to ambulate.  Easily rises on heels and toes  I have reviewed labs in epic and the results pertinent to this consultation are: Mild AKI (creatinine 2.07 reduced from 2.29 the day prior but still slightly increased from his baseline) Mildly elevated glucose at 133 CBC within normal limits Coags within normal limits Fibrinogen 456 (normal, requested by daughter as an assessment of possible APML recurrence) COVID/flu panel negative UA with moderate hemoglobin and proteinuria but otherwise negative for infection  EEG obtained on my recs:  Description: The posterior dominant rhythm consists of 8 Hz activity of moderate voltage (25-35 uV) seen predominantly in posterior head regions, symmetric and reactive to eye opening and eye closing.  EEG also showed continuous 3 to 5 Hz sharply contoured theta-delta slowing with overriding 15 to 18 Hz beta activity in left frontal region consistent with prior craniotomy.  Seizure was noted arising from left centro-temporal region on 12/15/2020 at 1629. During seizure, patient was noted to be aphasic at the beginning and then got confused and wanted to get out of the bed to urinate. Onset of seizure on 12/15/2020 at 1629, seizure was ongoing when study ended at  1634.  Concomitant EEG initially showed sharply contoured 13 to 14 Hz beta activity in left central temporal region which gradually involved all of left hemisphere followed by right hemisphere and evolved in morphology to 3 to  5 Hz theta- delta slowing.  Physiologic photic driving was not seen during photic stimulation.  Hyperventilation and photic stimulation were not performed.   ABNORMALITY -Focal seizure, left centro- temporal region -Breach artifact, left frontal region IMPRESSION: This study showed one seizure arising from left central temporal region on 12/15/2020 at 1629, was ongoing when the study ended at 1634.  During seizure, patient was initially not able to respond but then got confused and restless and wanting to get out of bed to urinate.  EEG also showed evidence of cortical dysfunction in left frontal region consistent with prior craniotomy.  If concern for ictal-interictal activity persist, recommend long-term monitoring.  I have reviewed the images obtained: Head CT stable from prior with significant encephalomalacia of the left frontal lobe into the temporal lobe as well  Impression: New onset focal aware seizures secondary to known prior ICH/resection and radiation.  Unlikely to be stroke or TIA given recurrent stereotyped events with resolution of symptoms in between episodes and prominent involvement of language.  Recommendations: - EEG, STAT  Addendum: read completed, c/w seizure associated with a captured spell as detailed above - Keppra 500 mg load, then 250 mg BID. Adjust as needed for renal function:  Estimated Creatinine Clearance: 30.6 mL/min (A) (by C-G formula based on SCr of 2.07 mg/dL (H)).   CrCl 80 to 130 mL/minute/1.73 m2: 500 mg to 1.5 g every 12 hours.  CrCl 50 to <80 mL/minute/1.73 m2: 500 mg to 1 g every 12 hours.  CrCl 30 to <50 mL/minute/1.73 m2: 250 to 750 mg every 12 hours.  CrCl 15 to <30 mL/minute/1.73 m2: 250 to 500 mg every 12 hours.  CrCl <15  mL/minute/1.73 m2: 250 to 500 mg every 24 hours (expert opinion). -Due to Ativan shortage, diazepam 5 mg IV for G TC lasting greater than 5 minutes and notify neurology -If patient has recurrent episodes of aphasia overnight, additional 500 mg of Keppra (ordered PRN) and please increase standing dose to 500 mg twice daily -Neurology will continue to follow -Daughter was updated as was Dr. Toy Care (outpatient psychiatrist, in agreement with trial of keppra) and Dr. Tobie Poet (primary team)  -Will consider discontinuation of methylphenidate as this can lower seizure threshold, hold for now  -Management of other comorbidities per primary team  Standard seizure precautions: Per [facility] statutes, patients with seizures are not allowed to drive until  they have been seizure-free for six months. Use caution when using heavy equipment or power tools. Avoid working on ladders or at heights. Take showers instead of baths. Ensure the water temperature is not too high on the home water heater. Do not go swimming alone. When caring for infants or small children, sit down when holding, feeding, or changing them to minimize risk of injury to the child in the event you have a seizure.  To reduce risk of seizures, maintain good sleep hygiene avoid alcohol and illicit drug use, take all anti-seizure medications as prescribed.   Lesleigh Noe MD-PhD [facility] [phone] [facility] coverage for [facility] is from 8 AM to 4 AM in-house and 4 PM to 8 PM by telephone/video. 8 PM to 8 AM emergent questions or overnight urgent questions should be addressed to Teleneurology On-call or [facility] neurohospitalist; contact information can be found on AMION

## 2020-12-15 NOTE — ED Notes (Signed)
Pt given meal tray for dinner

## 2020-12-15 NOTE — H&P (Addendum)
History and Physical   [name] YNX:[id number] DOB: [birth year] DOA: 12/15/2020  PCP: David Bush, MD  Outpatient Specialists: Dr. Rockey [name], [facility] Cardiology Patient coming from: [place]  I have personally briefly reviewed patient's old medical records in [place].  Chief Concern: "My head is not right"  HPI: David [name]. is a 80 y.o. male with medical history significant for metastatic melanoma to the lingula, left clavicular lymph node, proximal right humerus, right femur, history of left frontal lobe metastasis with left frontal lobe craniotomy with resection of metastatic melanoma, adenocarcinoma of the prostate, CAD status post CABG, CKD 3B, gout, depression, anxiety, acquired hypothyroid, hypertension, hyperlipidemia, presents emergency department for chief concerns of confusion.  He endorses difficulty speaking and stating his name, around noon after leaving the doctor's office. He reports thtis has never happened before. He denies changes to his medications.  He states that this is intermittent, lasting seconds to minutes.  During the episodes he does not understand what another person is saying.  He denies fever, nausea, vomiting, chestpain, shortness of breath, dysphagia, diarrhea, dysuria, syncope.  He denies recent head trauma.  Social history: He denies tobacco use. He was a former heavy user of etoh. His last drink was about one month ago. 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 He denies drug use. He is retired and formerly worked as Pharmacist, community.   Vaccination history: covid vaccinated, two doses and two boosters.  Patient is unsure of the vaccination manufacture  ROS: Constitutional: no weight change, no fever ENT/Mouth: no sore throat, no rhinorrhea Eyes: no eye pain, no vision changes Cardiovascular: no chest pain, no dyspnea,  no edema, no palpitations Respiratory: no cough, no  sputum, no wheezing Gastrointestinal: no nausea, no vomiting, no diarrhea, no constipation Genitourinary: no urinary incontinence, no dysuria, no hematuria Musculoskeletal: no arthralgias, no myalgias Skin: no skin lesions, no pruritus, Neuro: + weakness, no loss of consciousness, no syncope Psych: no anxiety, no depression, + decrease appetite Heme/Lymph: no bruising, no bleeding  ED Course: Discussed with emergency medicine provider, patient requiring hospitalization for altered mentation. Vitals in the emergency department was remarkable for temperature 97.5, respiration rate of 15, heart rate of 74, blood pressure 113/94, SPO2 of 94% on room air.  Labs in the emergency department was remarkable for sodium 137, potassium 4.8, chloride 103, bicarb 24, BUN 43, serum creatinine of 2.07, eGFR 32, WBC 4.9, hemoglobin 16.3, platelets 168.  COVID/influenza A/influenza B PCR were negative. EDP administered aspirin 325 mg and consulted neurology.  Assessment/Plan  Principal Problem:   Confusion Active Problems:   ICH (intracerebral hemorrhage) (HCC) - L frontal due to HTN vs CAA   Benign essential HTN   OSA on CPAP   Biventricular ICD (implantable cardioverter-defibrillator) in place   Hemorrhagic stroke ([place])   Hemiplegia and hemiparesis following nontraumatic intracerebral hemorrhage affecting right dominant side (HCC)   Aphasia following nontraumatic intracerebral hemorrhage   Chronic kidney disease, stage III (moderate) (HCC)   Lung mass   Malignant melanoma (HCC)   Metastasis to brain ([place])   Metastatic melanoma to lung (HCC)   MDD (major depressive disorder), recurrent episode, moderate (HCC)   Benign prostatic hyperplasia   Acquired hypothyroidism   Atherosclerosis of native coronary artery of native heart with stable angina pectoris ([place])   Ischemic cardiomyopathy   Cognitive and neurobehavioral dysfunction following brain injury ([place])   Hx of CABG  Prostate cancer ([place])    Dyslipidemia   Hypogonadism in male   Heart block AV complete ([place])   APML (acute promyelocytic leukemia) in remission ([place])   DNR (do not resuscitate)   Intermittent memory loss   # Stroke-like symptoms-etiology work-up in progress differentials include seizure # Altered mental status - etology workup in progress - CT head without contrast read as: No acute intracranial process - Neurology has been consulted and we appreciate further recommendations - Neurologist recommended EEG which has been ordered - MRI of the brain and MRA of the brain not able to be completed due to complete incompatibility of the pacemaker - Fasting lipid and A1c ordered - Check ammonia, B1, B12 - Permissive hypertension per neurology recommendations  - Frequent neuro vascular checks - PT, OT - Fall precaution, aspiration, seizure precaution  # Seizures on EEG - Status post Keppra loading dose 500 mg IV once - Keppra to 50 mg IV every 12 hours, first dose on Thursday, 12/16/2020 at 6 AM - Keppra 500 mg IV once as needed for recurrent spells of aphasia with instructions to notify neurology or primary team to increase schedule Keppra to 500 mg twice daily - Seizure precautions  # History of metastatic melanoma with metastasis to the lingula, right upper lung, supraventricular clavicular lymph node, left frontal lobe metastasis right femur - Status post left frontal craniotomy with resection of metastatic melanoma  # Major depressive disorder-sertraline 50 mg daily, trazodone 25 to 50 mg p.o. daily at bedtime  # BPH  # Acquired hypothyroid-levothyroxine 137 mcg daily in the morning resumed  # History of hemorrhagic stroke  # History of hemiplegia and hemiparesis following nontraumatic intracranial hemorrhage affecting right dominant side  # Primary hypertension-amlodipine 2.5 mg p.o. 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   DVT prophylaxis: Enoxaparin 40 mg subcutaneous daily at bedtime for 12/16/2020 at 2200 Code Status: DNR Diet: Heart healthy Family Communication: No Disposition Plan: Pending clinical course Consults called: Neurology Admission status: Observation, telemetry, MedSurg  Past Medical History:  Diagnosis Date   Anginal pain ([place])    Arthritis    mostly hands   Benign familial hematuria    BPH (benign prostatic hypertrophy)    Brain cancer (HCC)    melanoma with brain met   Coronary artery disease    GERD (gastroesophageal reflux disease)    High cholesterol    Hypertension    Hypothyroidism    Intracerebral hemorrhage (HCC)    Melanoma ([place])    Metastatic melanoma to lung (HCC)    Mood disorder ([place])    Myocardial infarction [facility])    Pacemaker    due to syncope, 3rd degree HB (upgrade to [employer] CRT-P 02/25/13 (Dr. Uvaldo Rising)   Rectal bleed    due to NSAIDS   Renal disorder    Sepsis ([place]) 12/09/2018   Skin cancer    melanoma   Sleep apnea    uses CPAP   Steroid-induced hyperglycemia  Stroke [facility])    Past Surgical History:  Procedure Laterality Date   APPLICATION OF CRANIAL NAVIGATION N/A 10/15/2015   Procedure: APPLICATION OF CRANIAL NAVIGATION;  Surgeon: Erline Levine, MD;  Location: [place] NEURO ORS;  Service: Neurosurgery;  Laterality: N/A;   BIV PACEMAKER GENERATOR CHANGEOUT N/A 04/02/2019   Procedure: BIV PACEMAKER GENERATOR CHANGEOUT;  Surgeon: Evans Lance, MD;  Location: [place] CV LAB;  Service:  Cardiovascular;  Laterality: N/A;   CARDIAC CATHETERIZATION  2013   CARDIAC SURGERY     bypass X 2   COLONOSCOPY WITH PROPOFOL N/A 05/08/2016   diverticulosis, int hem, no f/u needed [name])   CORONARY ARTERY BYPASS GRAFT  2013   LIMA-LAD, SVG-PDA 03/27/11 (Dr. Francee Gentile)   [place] N/A 10/15/2015   Procedure: LEFT FRONTAL CRANIOTOMY TUMOR EXCISION with Curve;  Surgeon: Erline Levine, MD;  Location: [place] NEURO ORS;  Service: Neurosurgery;  Laterality: N/A;  CRANIOTOMY TUMOR EXCISION   EYE SURGERY     FOOT SURGERY  08/2010   IR REMOVAL TUN ACCESS W/ PORT W/O FL MOD SED  08/06/2020   JOINT REPLACEMENT Left    partial knee   KNEE ARTHROSCOPY  12/2008   LYMPH NODE BIOPSY     PACEMAKER INSERTION     TONSILLECTOMY     Social History:  reports that he quit smoking about 63 years ago. His smoking use included cigarettes. He has a 3.00 pack-year smoking history. He has never used smokeless tobacco. He reports current alcohol use. He reports that he does not use drugs.  Allergies  Allergen Reactions   Ezetimibe Other (See Comments)    Unknown reaction   Simvastatin Other (See Comments)    Reaction:  Gave pt a fever  Fever - temp of 103.   In 2003   Family History  Problem Relation Age of Onset   Cancer Mother 18       lung    Hypertension Mother    Hypertension Father    Heart attack Father    Heart disease Father    Rheum arthritis Sister    Cancer Maternal Grandmother    Family history: Family history reviewed and pertinent for maternal grandmother with cancer, mother with lung cancer.  Pertinent for heart disease in father.  Prior to Admission medications   Medication Sig Start Date End Date Taking? Authorizing Provider  acetaminophen (TYLENOL) 325 MG tablet Take 2 tablets (650 mg total) by mouth every 6 (six) hours as needed for mild pain (or Fever >/= 101). 12/27/18  Yes Angiulli, Lavon Paganini, PA-C  allopurinol (ZYLOPRIM) 100 MG tablet Take 200 mg by mouth daily. 12/02/19  Yes [provider]  aspirin EC 81 MG tablet Take 81 mg by mouth every evening.    Yes [provider]  Cholecalciferol (VITAMIN D3) 25 MCG (1000 UT) CAPS Take 1 capsule (1,000 Units total) by mouth daily. 12/27/18  Yes Angiulli, Lavon Paganini, PA-C  colchicine 0.6 MG tablet Take 1 tablet (0.6 mg total) by mouth daily as needed (gout flare). May take 2 tablets on first day 05/31/20  Yes David Bush, MD  furosemide (LASIX) 20 MG tablet Take 1 tablet (20 mg total) by mouth every other day. 05/31/20  Yes David Bush, MD  levothyroxine (SYNTHROID) 137 MCG tablet TAKE 1 TABLET BY MOUTH ONCE DAILY BEFORE BREAKFAST 12/27/18  Yes Angiulli, Lavon Paganini, PA-C  meclizine (ANTIVERT) 12.5 MG tablet Take 1 tablet (12.5 mg total) by mouth 3 (three) times daily as needed for dizziness.  01/13/20  Yes Lesleigh Noe, MD  methylphenidate (RITALIN) 5 MG tablet TAKE 1 TABLET BY MOUTH TWICE DAILY AT BREAKFAST & LUNCH 12/09/20  Yes Vaslow, Acey Lav, MD  metoprolol succinate (TOPROL-XL) 50 MG 24 hr tablet TAKE 1 AND 1/2 TABLET (75 MG) BY MOUTH EVERY DAY WITH OR IMMEDIATELY FOLLOWING A MEAL *DO NOT CRUSH* 12/27/18  Yes Angiulli, Lavon Paganini, PA-C  sertraline (ZOLOFT) 50 MG tablet Take 1 tablet (50 mg total) by mouth daily. Take with breakfast.  Initiate 41m dose after completing 5 days of 222m. 06/11/19  Yes FiLloyd HugerMD  traZODone (DESYREL) 50 MG tablet Take 0.5-1 tablets (25-50 mg total) by mouth at bedtime. 10/14/18  Yes GuRia BushMD  Turmeric 500 MG CAPS Take 2 capsules by mouth daily as needed (joint pain). 05/31/20  Yes GuRia BushMD  amLODipine (NORVASC) 2.5 MG tablet Take 1 tablet (2.5 mg total) by mouth daily. Do not take if dizzy and Systolic BP (top number) is less than 120. 02/27/20 10/05/20  Visser, Jacquelyn D, PA-C  Evolocumab (REPATHA SURECLICK) 14111G/ML SOAJ Inject 1 pen into the skin every 14 (fourteen) days. 02/27/20   ViMarrianne Mood, PA-C  rosuvastatin (CRESTOR) 20 MG tablet  Take 1 tablet (20 mg total) by mouth daily. Patient not taking: Reported on 12/15/2020 10/05/20   GoMinna MerrittsMD  sertraline (ZOLOFT) 100 MG tablet Take by mouth. Patient not taking: Reported on 12/15/2020 11/19/15   [provider]  testosterone cypionate (DEPOTESTOSTERONE CYPIONATE) 200 MG/ML injection INJECT 0.5ML (100MG TOTAL) INTRAMUSCULARLY EVERY 14 DAYS (SINGLE DOSE VIAL) *ONLY GOOD FOR 1 DOSE* *CALL FOR REFILL* 10/13/20   GuRia BushMD   Physical Exam: Vitals:   12/15/20 1203 12/15/20 1230 12/15/20 1300  BP: (!) 120/91 (!) 113/94   Pulse: 76 75 74  Resp: 18 18 15   Temp: (!) 97.5 F (36.4 C)    TempSrc: Oral    SpO2: 96% 95% 94%  Weight: 90.7 kg    Height: 5' 8"  (1.727 m)     Constitutional: appears age-appropriate, NAD, calm, comfortable Eyes: PERRL, lids and conjunctivae normal ENMT: Mucous membranes are moist. Posterior pharynx clear of any exudate or lesions. Age-appropriate dentition. Hearing appropriate Neck: normal, supple, no masses, no thyromegaly Respiratory: clear to auscultation bilaterally, no wheezing, no crackles. Normal respiratory effort. No accessory muscle use.  Cardiovascular: Regular rate and rhythm, no murmurs / rubs / gallops. No extremity edema. 2+ pedal pulses. No carotid bruits.  Abdomen: no tenderness, no masses palpated, no hepatosplenomegaly. Bowel sounds positive.  Musculoskeletal: no clubbing / cyanosis. No joint deformity upper and lower extremities. Good ROM, no contractures, no atrophy. Normal muscle tone.  Skin: no rashes, lesions, ulcers. No induration Neurologic: Sensation intact. Strength 5/5 in all 4.  Psychiatric: Normal judgment and insight. Alert and oriented x 3. Normal mood.   EKG: independently reviewed, showing AV dual paced rhythm with rate of 75, QTc 489  Chest x-ray on Admission: I personally reviewed and I agree with radiologist reading as below.  CT HEAD WO CONTRAST  Result Date: 12/15/2020 CLINICAL DATA:   History of CVA, concern for TIAs EXAM: CT HEAD WITHOUT CONTRAST TECHNIQUE: Contiguous axial images were obtained from the base of the skull through the vertex without intravenous contrast. COMPARISON:  11/22/2020 FINDINGS: Brain: Status post left frontal craniotomy with subjacent encephalomalacia. Unchanged appearance of the resection cavity. Unchanged ex vacuo dilatation of the frontal horn of the left lateral ventricle. Evidence of acute infarction,  hemorrhage, hydrocephalus, extra-axial collection, mass, mass effect, or midline shift. Vascular: No hyperdense vessel or unexpected calcification. Skull: Prior left frontal craniotomy. No aggressive osseous lesions. Sinuses/Orbits: Left scleral band. Status post bilateral lens replacements. Normal paranasal sinuses. Other: The mastoids are well aerated. IMPRESSION: No acute intracranial process. Electronically Signed   By: Merilyn Baba M.D.   On: 12/15/2020 13:46    Labs on Admission: I have personally reviewed following labs  CBC: Recent Labs  Lab 12/14/20 1129 12/15/20 1206  WBC 5.1 4.9  NEUTROABS 3.6 3.7  HGB 15.6 16.3  HCT 47.2 46.5  MCV 95.5 92.1  PLT 169 300   Basic Metabolic Panel: Recent Labs  Lab 12/14/20 1129 12/15/20 1206  NA 138 137  K 4.6 4.8  CL 103 103  CO2 24 24  GLUCOSE 126* 133*  BUN 48* 43*  CREATININE 2.29* 2.07*  CALCIUM 9.0 9.3   GFR: Estimated Creatinine Clearance: 30.6 mL/min (A) (by C-G formula based on SCr of 2.07 mg/dL (H)).  Liver Function Tests: Recent Labs  Lab 12/14/20 1129 12/15/20 1206  AST 25 25  ALT 14 14  ALKPHOS 54 51  BILITOT 0.6 0.7  PROT 7.3 6.9  ALBUMIN 4.2 4.3   Coagulation Profile: Recent Labs  Lab 12/15/20 1206  INR 1.0   CBG: Recent Labs  Lab 12/09/20 1313  GLUCAP 88   Urine analysis:    Component Value Date/Time   COLORURINE YELLOW 10/12/2020 1118   APPEARANCEUR CLEAR 10/12/2020 1118   LABSPEC 1.025 10/12/2020 1118   PHURINE 6.0 10/12/2020 1118   GLUCOSEU  NEGATIVE 10/12/2020 1118   HGBUR MODERATE (A) 10/12/2020 1118   BILIRUBINUR NEGATIVE 10/12/2020 1118   KETONESUR NEGATIVE 10/12/2020 1118   PROTEINUR NEGATIVE 01/03/2019 0046   UROBILINOGEN 0.2 10/12/2020 1118   NITRITE NEGATIVE 10/12/2020 1118   LEUKOCYTESUR NEGATIVE 10/12/2020 1118   Dr. Tobie Poet [facility]  If 7PM-7AM, please contact overnight-coverage provider If 7AM-7PM, please contact day coverage provider [email or web address]  12/15/2020, 3:25 PM

## 2020-12-15 NOTE — ED Notes (Signed)
Pt moved to cpod  Report off to michelle rn cpod nurse

## 2020-12-15 NOTE — ED Notes (Signed)
Resumed care from [facility].  Pt alert.  Pt states I 'm having difficulty talking   Neurologist in with pt now.  meds given  Iv in place.

## 2020-12-15 NOTE — ED Triage Notes (Signed)
See first nurse note, pt reports having trouble with speech and thinking of words since yesterday with a little bit of generalized weakness.  Pt noted to have difficulty thinking of words No facial droop noted Pt keeps saying "my mind is not right"

## 2020-12-16 ENCOUNTER — Ambulatory Visit: Payer: Medicare Other | Admitting: Family Medicine

## 2020-12-16 DIAGNOSIS — R410 Disorientation, unspecified: Secondary | ICD-10-CM | POA: Diagnosis not present

## 2020-12-16 LAB — LIPID PANEL
Cholesterol: 138 mg/dL (ref 0–200)
HDL: 43 mg/dL (ref >40)
LDL Cholesterol: 58 mg/dL (ref 0–99)
Total CHOL/HDL Ratio: 3.2 RATIO
Triglycerides: 185 mg/dL — ABNORMAL HIGH (ref <150)
VLDL: 37 mg/dL (ref 0–40)

## 2020-12-16 LAB — BASIC METABOLIC PANEL
Anion gap: 11 (ref 5–15)
BUN: 43 mg/dL — ABNORMAL HIGH (ref 8–23)
CO2: 23 mmol/L (ref 22–32)
Calcium: 9.1 mg/dL (ref 8.9–10.3)
Chloride: 103 mmol/L (ref 98–111)
Creatinine, Ser: 1.84 mg/dL — ABNORMAL HIGH (ref 0.61–1.24)
GFR, Estimated: 36 mL/min — ABNORMAL LOW (ref >60)
Glucose, Bld: 88 mg/dL (ref 70–99)
Potassium: 4.6 mmol/L (ref 3.5–5.1)
Sodium: 137 mmol/L (ref 135–145)

## 2020-12-16 LAB — CBC
HCT: 45.2 % (ref 39.0–52.0)
Hemoglobin: 15.5 g/dL (ref 13.0–17.0)
MCH: 32.1 pg (ref 26.0–34.0)
MCHC: 34.3 g/dL (ref 30.0–36.0)
MCV: 93.6 fL (ref 80.0–100.0)
Platelets: 173 10*3/uL (ref 150–400)
RBC: 4.83 MIL/uL (ref 4.22–5.81)
RDW: 14 % (ref 11.5–15.5)
WBC: 5.3 10*3/uL (ref 4.0–10.5)
nRBC: 0 % (ref 0.0–0.2)

## 2020-12-16 LAB — HEMOGLOBIN A1C
Hgb A1c MFr Bld: 5.9 % — ABNORMAL HIGH (ref 4.8–5.6)
Mean Plasma Glucose: 122.63 mg/dL

## 2020-12-16 MED ORDER — SODIUM CHLORIDE 0.9 % IV SOLN: 750.0000 mg | Freq: Two times a day (BID) | INTRAVENOUS | Status: AC

## 2020-12-16 MED ORDER — VITAMIN B-12 1000 MCG PO TABS
1000.0000 ug | ORAL_TABLET | Freq: Every day | ORAL | Status: DC
  Administered 2020-12-16 – 2020-12-17 (×2): 1000 ug via ORAL
  Filled 2020-12-16 (×2): qty 1

## 2020-12-16 MED ORDER — SODIUM CHLORIDE 0.9 % IV SOLN
750.0000 mg | INTRAVENOUS | Status: AC
  Administered 2020-12-16: 750 mg via INTRAVENOUS
  Filled 2020-12-16: qty 7.5

## 2020-12-16 MED ORDER — DIVALPROEX SODIUM 250 MG PO DR TAB
250.0000 mg | DELAYED_RELEASE_TABLET | Freq: Three times a day (TID) | ORAL | Status: DC
  Administered 2020-12-16 – 2020-12-17 (×2): 250 mg via ORAL
  Filled 2020-12-16 (×4): qty 1

## 2020-12-16 MED ORDER — DIVALPROEX SODIUM 250 MG PO DR TAB: 250.0000 mg | DELAYED_RELEASE_TABLET | Freq: Three times a day (TID) | ORAL | Status: DC

## 2020-12-16 MED ORDER — CYANOCOBALAMIN 1000 MCG PO TABS: 1000.0000 ug | ORAL_TABLET | Freq: Every day | ORAL | Status: AC

## 2020-12-16 MED ORDER — DIVALPROEX SODIUM 125 MG PO CSDR
750.0000 mg | DELAYED_RELEASE_CAPSULE | Freq: Once | ORAL | Status: AC
  Administered 2020-12-16: 15:00:00 750 mg via ORAL
  Filled 2020-12-16: qty 6

## 2020-12-16 MED ORDER — SODIUM CHLORIDE 0.9 % IV SOLN
750.0000 mg | Freq: Two times a day (BID) | INTRAVENOUS | Status: DC
  Administered 2020-12-16 – 2020-12-17 (×3): 750 mg via INTRAVENOUS
  Filled 2020-12-16 (×4): qty 7.5

## 2020-12-16 NOTE — Progress Notes (Addendum)
Neurology Progress Note  Patient ID: David [name]. is a 80 y.o. right-handed male with past medical history significant for melanoma with metastasis to left frontal lobe complicated by hemorrhage in 2017 (s/p resection and radiation), APML, hypertension, hypercholesterolemia, sleep apnea, CAD s/p CABG (2012), AV node dysfunction s/p pacemaker.   Initially consulted for: Intermittent aphasia  Major interval events/Subjective: - Doesn't recall event during EEG  - Reports he was having an event at about 9 AM having received his medication this morning at 6 AM, after which his speech has been improving, which is confirmed by therapist at bedside - Tolerating Keppra well without any significant sleepiness  Exam: Vitals:   12/16/20 0447 12/16/20 0818  BP: (!) 131/92 (!) 127/93  Pulse: 74 75  Resp: 17 16  Temp: 98 F (36.7 C) 97.8 F (36.6 C)  SpO2: 96% 96%   Gen: In bed, comfortable  Resp: non-labored breathing, no grossly audible wheezing Cardiac: Perfusing extremities well  Abd: soft, nt  Neuro: MS: Continues to have mild to moderate aphasia with notable word finding difficulty though able to follow simple commands does struggle with understanding complex sentences CN: Continues to have a subtle right facial droop, EOMI with mild bilateral ptosis, tongue midline Motor: Continue statins hemiparesis on the right side, while working with therapy he is noted not to raise his right arm as well as the left  Pertinent Labs: B12 383 Creatinine improving to 1.84, essentially baseline now  Impression: Focal aware and unaware seizures, currently uncontrolled.  Given that he is not able to accurately report the number of events he is having, and on further history daughter reports he has been having good days and bad days for a while concerning for potential longstanding seizures potentially meeting criteria for focal status epilepticus (seizures without return to baseline at this time),  will plan to transfer to [facility] for long-term EEG monitoring to assist with AED titration  Recommendations:  #Focal aware seizures, localization-related, uncontrolled -Transfer to [facility] for long-term EEG, Dr. Cheral Marker and Dr. Hortense Ramal aware and neurology will follow in consultation -Initially given Keppra 500 mg load, then started 250 mg BID.  Due to uncontrolled symptomatic seizures I will give another 750 mg now and increase standing dose to max for his renal function of 750 twice daily, especially given how well he is tolerating medication Estimated Creatinine Clearance: 34.4 mL/min (A) (by C-G formula based on SCr of 1.84 mg/dL (H)).   CrCl 80 to 130 mL/minute/1.73 m2: 500 mg to 1.5 g every 12 hours.  CrCl 50 to <80 mL/minute/1.73 m2: 500 mg to 1 g every 12 hours.  CrCl 30 to <50 mL/minute/1.73 m2: 250 to 750 mg every 12 hours.  CrCl 15 to <30 mL/minute/1.73 m2: 250 to 500 mg every 12 hours.  CrCl <15 mL/minute/1.73 m2: 250 to 500 mg every 24 hours (expert opinion). -Due to Ativan shortage, diazepam 5 mg IV for GTC lasting greater than 5 minutes and notify neurology -If patient continues to have recurrent episodes of aphasia, will need to add a second agent -Daughter was updated as was Dr. Tawanna Solo, additionally discussed with Dr. Hortense Ramal and Dr. Cheral Marker, -Will consider discontinuation of methylphenidate as this can lower seizure threshold, hold for now  -Management of other comorbidities per primary team  #Borderline B12 -Methylmalonic acid level ordered, pending -Empiric 1000 mg daily B12 oral, may be discontinued if MMA results within normal limits   Standard seizure precautions: Per [facility]  statutes, patients with seizures are not allowed to drive until  they have been seizure-free for six months. Use caution when using heavy equipment or power tools. Avoid working on ladders or at heights. Take showers instead of baths. Ensure the water temperature is  not too high on the home water heater. Do not go swimming alone. When caring for infants or small children, sit down when holding, feeding, or changing them to minimize risk of injury to the child in the event you have a seizure.  To reduce risk of seizures, maintain good sleep hygiene avoid alcohol and illicit drug use, take all anti-seizure medications as prescribed.   Lesleigh Noe MD-PhD [facility] [phone]  [facility] coverage for [facility] is from 8 AM to 4 AM in-house and 4 PM to 8 PM by telephone/video. 8 PM to 8 AM emergent questions or overnight urgent questions should be addressed to Teleneurology On-call or [facility] neurohospitalist; contact information can be found on AMION  Greater than 35 minutes were spent in the care of this patient today, of which greater than 50% was at bedside  Addendum -- at 2 PM patient reports he is continuing to have episodic language impairment. Will add depakote 750 mg oral loading dose (given critical shortage of IV formulation) and start 250 mg q8hr maintenance

## 2020-12-16 NOTE — Evaluation (Addendum)
Physical Therapy Evaluation Patient Details Name: David [name]. MRN: [id number] DOB: September 13, 1940 Today's Date: 12/16/2020   History of Present Illness  presented to ER secondary to AMS, word-finding difficulties; admitted for TIA/CVA work up.  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  Per neurology, patient noted with active seizure activity on EEG, correlating with episodes of speech difficulties. Keppra initiated (and planning to increase dosage) Cleared for session and mobility per neurology.  Clinical Impression  Patient resting in bed upon arrival to room; recently finished breakfast meal tray.  Alert and oriented to basic information; follows commands and agreeable to session.  At initial presentation, patient with significant difficulty with word-finding and expressive speech, ultimately requiring yes/no questions for conversation (with 100% accuracy).  Notably improved throughout session, expressively communicating wants, needs and general conversation with approx 75% accuracy by end of session.  Mild R UE < LE weakness noted (baseline due to L frontal craniotomy); however, no acute change in strength, ROM, sensation or coordination noted at this time.  Able to complete bed mobility with mod indep; sit/stand, basic transfers and gait (75') with quad cane, cga/min assist.  Demonstrates broad BOS, slightly staggered stepping pattern with inconsistent R foot placement; spontaneously moves quad cane from R to L, but prefers R hand.  Consistently reaching for therapist, external surfaces for additional stabilization due to feelings of unsteadiness. Dynamic balance deficits evident. Additional gait trial completed 200' with RW, cga/close sup-improved step height/length, foot clearance and overall gait fluidity/symmetry with use of RW.  Does veer towards R at times, self-corrects with increased time. Patient subjectively reporting improved comfort/confidence with use of assist  device.  10' walk time with RW, 8 seconds.  Do recommend use of RW with all gait efforts at this time; patient voiced understanding and agreement. Would benefit from skilled PT to address above deficits and promote optimal return to PLOF.; Recommend transition to HHPT upon discharge from acute hospitalization.     Follow Up Recommendations      Equipment Recommendations       Recommendations for Other Services       Precautions / Restrictions Precautions Precautions: Fall Restrictions Weight Bearing Restrictions: No      Mobility  Bed Mobility Overal bed mobility: Modified Independent                  Transfers Overall transfer level: Needs assistance   Transfers: Sit to/from Stand Sit to Stand: Min guard;Min assist         General transfer comment: broad BOS, increased time to move COG over BOS; does require UEs to assist and intermittently stabilize  Ambulation/Gait Ambulation/Gait assistance: Min assist Gait Distance (Feet): 75 Feet Assistive device: Quad cane       General Gait Details: broad BOS, slightly staggered stepping pattern with inconsistent R foot placement; spontaneously moves quad cane from R to L, but prefers R hand.  Consistently reaching for therapist, external surfaces for additional stabilization due to feelings of unsteadiness.  Stairs            Wheelchair Mobility    Modified Rankin (Stroke Patients Only)       Balance Overall balance assessment: Needs assistance Sitting-balance support: No upper extremity supported;Feet supported Sitting balance-Leahy Scale: Good     Standing balance support: Single extremity supported Standing balance-Leahy Scale: Fair  Pertinent Vitals/Pain Pain Assessment: No/denies pain    Home Living Family/patient expects to be discharged to:: Private residence Living Arrangements: Spouse/significant other Available Help at Discharge:  Family;Personal care attendant Type of Home: House Home Access: Stairs to enter Entrance Stairs-Rails: Left Entrance Stairs-Number of Steps: 2 Home Layout: One level Home Equipment: Cane - quad      Prior Function Level of Independence: Independent with assistive device(s)         Comments: Mod indep with ADLs, household and limited community mobilization; intermittent use of quad cane (esp if outside of the home).  Endorses single fall in previous year.  Has aide that comes 4 hours/day to assist with household chores and self-care as needed.     Hand Dominance   Dominant Hand: Right    Extremity/Trunk Assessment   Upper Extremity Assessment Upper Extremity Assessment:  (grossly 4+/5 throughout; no focal weakness, sensory or coordination deficit appreciated)    Lower Extremity Assessment Lower Extremity Assessment:  (L LE grossly 4+/5, R LE grossly 4/5 (history of L frontal crani); denies sensory, coordination deficit.  No acute change in LE performance per patient.)       Communication   Communication: Expressive difficulties  Cognition Arousal/Alertness: Awake/alert Behavior During Therapy: WFL for tasks assessed/performed Overall Cognitive Status: Within Functional Limits for tasks assessed                                 General Comments: alert and oriented; follows commands; pleasant and cooperative.  Aware of speech difficulties throughout session, indep initiating some compensatory strategies as needed.      General Comments      Exercises Other Exercises Other Exercises: 200' with RW, cga/close sup-improved step height/length, foot clearance and overall gait fluidity/symmetry with use of RW.  Does veer towards R at times, self-corrects with increased time. Patient subjectively reporting improved comfort/confidence with use of assist device.  10' walk time with RW, 8 seconds.   Assessment/Plan    PT Assessment Patient needs continued PT  services  PT Problem List Decreased strength;Decreased balance;Decreased mobility;Decreased knowledge of use of DME;Decreased safety awareness;Decreased knowledge of precautions       PT Treatment Interventions DME instruction;Gait training;Stair training;Functional mobility training;Therapeutic activities;Therapeutic exercise;Balance training;Patient/family education;Neuromuscular re-education    PT Goals (Current goals can be found in the Care Plan section)  Acute Rehab PT Goals Patient Stated Goal: to go back home and be with my family PT Goal Formulation: With patient Time For Goal Achievement: 12/30/20 Potential to Achieve Goals: Good    Frequency Min 2X/week   Barriers to discharge        Co-evaluation               AM-PAC PT "6 Clicks" Mobility  Outcome Measure Help needed turning from your back to your side while in a flat bed without using bedrails?: None Help needed moving from lying on your back to sitting on the side of a flat bed without using bedrails?: None Help needed moving to and from a bed to a chair (including a wheelchair)?: A Little Help needed standing up from a chair using your arms (e.g., wheelchair or bedside chair)?: A Little Help needed to walk in hospital room?: A Little Help needed climbing 3-5 steps with a railing? : A Little 6 Click Score: 20    End of Session Equipment Utilized During Treatment: Gait belt Activity Tolerance: Patient tolerated treatment well  Patient left: in chair;with call bell/phone within reach;with chair alarm set Nurse Communication: Mobility status PT Visit Diagnosis: Other symptoms and signs involving the nervous system (N63.943)    Time: 2003-7944 PT Time Calculation (min) (ACUTE ONLY): 33 min   Charges:   PT Evaluation $PT Eval Moderate Complexity: 1 Mod PT Treatments $Gait Training: 8-22 mins        Joslin Doell H. Owens Shark, PT, DPT, NCS 12/16/20, 2:15 PM [phone]

## 2020-12-16 NOTE — Progress Notes (Signed)
OT Cancellation Note  Patient Details Name: David [name]. MRN: [id number] DOB: Apr 25, 1940   Cancelled Treatment:    Reason Eval/Treat Not Completed: Other (comment). Consult received, chart reviewed. Per chart and multidisciplinary rounds, pt is transferring to [facility] for additional care. Will complete current OT order. Please re-consult OT at later date/time as appropriate should additional concerns arise.   Hanley Hays, MPH, MS, OTR/L ascom [phone] 12/16/20, 11:57 AM

## 2020-12-16 NOTE — Progress Notes (Signed)
SLP Cancellation Note  Patient Details Name: David [name]. MRN: [id number] DOB: [birth year]   Cancelled treatment:       Reason Eval/Treat Not Completed: Medical issues which prohibited therapy (chart reviewed; consulted pt then NSG. Talked w/ Dtr on phone.). Met w/ pt earlier this morning; talked w/ Daughter, [name], on phone at time of this note. Discussed w/ both pt and Dtr that a Cognitive-linguistic evaluation was recommended at next venue of care -- d/t the ongoing additional medical assessment/care needs indicated presently. Per chart and multidisciplinary rounds, pt is transferring to [facility] for additional care(LTM EEG). Initial EEG done revealed seizure activity in the left central temporal region(even this morning). Pt was loaded with Keppra, currently on IV Keppra. Pt is alert and oriented, able to engage appropriately w/ this SLP to order Lunch meal items. However, slight hesitation and word finding deficits w/ an intermittent paraphasia were noted. Slurred speech much improved, but overall, pt does not feel he is back to his baseline. Discussed  need for f/u at [facility] to determine skilled ST needs at Discharge then. Both pt and Dtr agreed. Daughter, [name], will f/u w/ Rehab/MD there.        Orinda Kenner, MS, CCC-SLP Speech Language Pathologist Rehab Services [phone] [facility] 12/16/2020, 12:56 PM

## 2020-12-16 NOTE — Plan of Care (Signed)
Patient was assist to bathroom and while sitting on toilet - had a witnessed seizure lasting about 90 seconds.  No shaking or tremors noted, but patient notably "spaced out" and was unable to speak or respond to questions.  No valium was given  - since the episode didn't exceed 5 minutes.  After concluded - assisted back to  the bed and asked for dinner.  No signs of further distress noted or difficulty eating or drinking.  Patient did report a  metallic taste and a decreased sense of smell just prior to this episode (which seems to be the usual occurrences with most of his "known" seizure activity).

## 2020-12-16 NOTE — Discharge Summary (Addendum)
Physician Discharge Summary  David [name]. RSW:[id number] DOB: 1940/04/27 DOA: 12/15/2020  PCP: [name], MD  Admit date: 12/15/2020 Discharge date: 12/17/2020  Admitted From: Home Disposition: Transfer to [facility]  Discharge Condition:Stable CODE STATUS:DNR Diet recommendation: Heart Healthy    Brief/Interim Summary:  Patient is a 80 year old male with history of metastatic melanoma left frontal lobe metastasis with left frontal lobe craniotomy with resection of metastatic melanoma, adenocarcinoma of the prostate, CAD status post CABG, CKD 3B, gout, depression, anxiety, acquired hypothyroid, hypertension, hyperlipidemia, presents emergency department for chief concerns of confusion, slurred speech.  On presentation he was hemodynamically stable.  He was also confused at home.  CT head did not show any acute intracranial abnormalities.  Neurology consulted.  EEG done which showed seizure activity in the left central temporal region.  Loaded with Keppra, currently on IV Keppra.  Due to the need of LTM EEG, he has been transferred to [facility].  Currently his mental status has much improved.  Currently alert and oriented, slurred speech has  almost resolved but still not back to baseline.  Following problems were addressed during his hospitalization:  Altered mental status/seizure: Presented with confusion, slurred speech.  Lives with family at home.  Ambulatory at baseline.  No  new focal neurological deficits on presentation.  CT head did not show any acute intracranial normalities.  EEG showed seizure activity in the left central temporal region.  Loaded with Keppra, currently on IV Keppra and depakote. As per neurology, he needs LTM EEG.  Neurology with follow up at [facility]. Currently he is alert and oriented, slurred speech is almost resolved. He takes Ritalin at home which has been discontinued  History of metastatic melanoma with metastasis to the lingula, right upper  lung, supraventricular clavicular lymph node, left frontal lobe metastasis : Status post left frontal craniotomy with resection of metastatic melanoma We recommend to follow-up with oncology as an outpatient.   Major depressive disorder: On sertraline 50 mg daily, trazodone 25 to 50 mg p.o. daily at bedtime   Acquired hypothyroidism : On levothyroxine 137 mcg daily in the morning resumed   History of hemiplegia and hemiparesis following nontraumatic intracranial hemorrhage affecting right dominant side: Stable.  Ambulates with help of walker/cane at home   Hypertension: On amlodipine 2.5 mg p.o. daily , metoprolol succinate 75 mg daily, resumed   Hyperlipidemia: On rosuvastatin 20 mg daily   OSA :on CPAP   History of CAD status post CABG H/O Atherosclerosis of native coronary artery with stable angina - Status post LIMA to the LAD, SVG to the PDA   H/O Acute promyelocytic leukemia: Currently in remission, outpatient follow-up   History of complete AV block status post pacemaker: Status post biventricular ICD    CKD 3B: Kidney function at baseline     Discharge Diagnoses:  Principal Problem:   Confusion Active Problems:   ICH (intracerebral hemorrhage) (HCC) - L frontal due to HTN vs CAA   Benign essential HTN   OSA on CPAP   Biventricular ICD (implantable cardioverter-defibrillator) in place   Hemorrhagic stroke ([place])   Hemiplegia and hemiparesis following nontraumatic intracerebral hemorrhage affecting right dominant side (HCC)   Aphasia following nontraumatic intracerebral hemorrhage   Chronic kidney disease, stage III (moderate) (HCC)   Lung mass   Malignant melanoma ([place])   Metastasis to brain ([place])   Metastatic melanoma to lung ([place])   MDD (major depressive disorder), recurrent episode, moderate ([place])   Benign prostatic hyperplasia   Acquired  hypothyroidism   Atherosclerosis of native coronary artery of native heart with stable angina pectoris ([place])    Ischemic cardiomyopathy   Cognitive and neurobehavioral dysfunction following brain injury ([place])   Hx of CABG   Prostate cancer ([place])   Dyslipidemia   Hypogonadism in male   Heart block AV complete ([place])   APML (acute promyelocytic leukemia) in remission ([place])   DNR (do not resuscitate)   Intermittent memory loss   Seizure [facility])    Discharge Instructions  Discharge Instructions     Diet - low sodium heart healthy   Complete by: As directed    Discharge instructions   Complete by: As directed    1)You are being transferred to [facility] for LTM EEG.You will be cared by a hospitalist physician and neurology.   Increase activity slowly   Complete by: As directed       Allergies as of 12/17/2020       Reactions   Ezetimibe Other (See Comments)   Unknown reaction   Simvastatin Other (See Comments)   Reaction:  Gave pt a fever  Fever - temp of 103.   In 2003        Medication List     STOP taking these medications    methylphenidate 5 MG tablet Commonly known as: RITALIN   rosuvastatin 20 MG tablet Commonly known as: CRESTOR       TAKE these medications    acetaminophen 325 MG tablet Commonly known as: TYLENOL Take 2 tablets (650 mg total) by mouth every 6 (six) hours as needed for mild pain (or Fever >/= 101).   allopurinol 100 MG tablet Commonly known as: ZYLOPRIM Take 200 mg by mouth daily.   amLODipine 2.5 MG tablet Commonly known as: NORVASC Take 1 tablet (2.5 mg total) by mouth daily. Do not take if dizzy and Systolic BP (top number) is less than 120.   aspirin EC 81 MG tablet Take 81 mg by mouth every evening.   colchicine 0.6 MG tablet Take 1 tablet (0.6 mg total) by mouth daily as needed (gout flare). May take 2 tablets on first day   cyanocobalamin 1000 MCG tablet Take 1 tablet (1,000 mcg total) by mouth daily.   divalproex 500 MG DR tablet Commonly known as: DEPAKOTE Take 1 tablet (500 mg total) by mouth every 8 (eight) hours.    furosemide 20 MG tablet Commonly known as: LASIX Take 1 tablet (20 mg total) by mouth every other day.   levETIRAcetam 750 mg in sodium chloride 0.9 % 100 mL Inject 750 mg into the vein every 12 (twelve) hours.   levothyroxine 137 MCG tablet Commonly known as: SYNTHROID TAKE 1 TABLET BY MOUTH ONCE DAILY BEFORE BREAKFAST   meclizine 12.5 MG tablet Commonly known as: ANTIVERT Take 1 tablet (12.5 mg total) by mouth 3 (three) times daily as needed for dizziness.   metoprolol succinate 50 MG 24 hr tablet Commonly known as: TOPROL-XL TAKE 1 AND 1/2 TABLET (75 MG) BY MOUTH EVERY DAY WITH OR IMMEDIATELY FOLLOWING A MEAL *DO NOT CRUSH*   Repatha SureClick 160 MG/ML [place] Generic drug: Evolocumab Inject 1 pen into the skin every 14 (fourteen) days.   sertraline 50 MG tablet Commonly known as: ZOLOFT Take 1 tablet (50 mg total) by mouth daily. Take with breakfast.  Initiate 50mg  dose after completing 5 days of 25mg  . What changed: Another medication with the same name was removed. Continue taking this medication, and follow the directions you see  here.   testosterone cypionate 200 MG/ML injection Commonly known as: DEPOTESTOSTERONE CYPIONATE INJECT 0.5ML (100MG  TOTAL) INTRAMUSCULARLY EVERY 14 DAYS (SINGLE DOSE VIAL) *ONLY GOOD FOR 1 DOSE* *CALL FOR REFILL*   traZODone 50 MG tablet Commonly known as: DESYREL Take 0.5-1 tablets (25-50 mg total) by mouth at bedtime.   Turmeric 500 MG Caps Take 2 capsules by mouth daily as needed (joint pain).   Vitamin D3 25 MCG (1000 UT) Caps Take 1 capsule (1,000 Units total) by mouth daily.        Allergies  Allergen Reactions   Ezetimibe Other (See Comments)    Unknown reaction   Simvastatin Other (See Comments)    Reaction:  Gave pt a fever  Fever - temp of 103.   In 2003    Consultations: neurology   Procedures/Studies: CT HEAD WO CONTRAST  Result Date: 12/15/2020 CLINICAL DATA:  History of CVA, concern for TIAs EXAM: CT HEAD  WITHOUT CONTRAST TECHNIQUE: Contiguous axial images were obtained from the base of the skull through the vertex without intravenous contrast. COMPARISON:  11/22/2020 FINDINGS: Brain: Status post left frontal craniotomy with subjacent encephalomalacia. Unchanged appearance of the resection cavity. Unchanged ex vacuo dilatation of the frontal horn of the left lateral ventricle. Evidence of acute infarction, hemorrhage, hydrocephalus, extra-axial collection, mass, mass effect, or midline shift. Vascular: No hyperdense vessel or unexpected calcification. Skull: Prior left frontal craniotomy. No aggressive osseous lesions. Sinuses/Orbits: Left scleral band. Status post bilateral lens replacements. Normal paranasal sinuses. Other: The mastoids are well aerated. IMPRESSION: No acute intracranial process. Electronically Signed   By: Merilyn Baba M.D.   On: 12/15/2020 13:46   CT Head W Wo Contrast  Result Date: 11/23/2020 CLINICAL DATA:  Melanoma with brain metastases. EXAM: CT HEAD WITHOUT AND WITH CONTRAST TECHNIQUE: Contiguous axial images were obtained from the base of the skull through the vertex without and with intravenous contrast CONTRAST:  39mL OMNIPAQUE IOHEXOL 300 MG/ML  SOLN COMPARISON:  11/24/2019 FINDINGS: Brain: Status post left frontal craniotomy with subjacent encephalomalacia. Unchanged appearance of the resection cavity. Redemonstrated minimal nodular enhancement about the dilated frontal horn of the left lateral ventricle is unchanged and likely postoperative. No new enhancement or masslike area. No acute infarct. No extra-axial collection. Vascular: No hyperdense vessel or unexpected calcification. Visible vessels are patent. Skull: Prior left frontal craniotomy no aggressive osseous lesion. Sinuses/Orbits: Left scleral band. Prior lens replacements. Normal paranasal sinuses. Other: The mastoids are well aerated IMPRESSION: No new enhancement or masslike area to suggest new metastatic disease.  Electronically Signed   By: Merilyn Baba M.D.   On: 11/23/2020 09:44   NM PET Image Restage (PS) Whole Body (F-18 FDG)  Result Date: 12/11/2020 CLINICAL DATA:  Subsequent treatment strategy for melanoma. EXAM: NUCLEAR MEDICINE PET WHOLE BODY TECHNIQUE: 10.7 mCi F-18 FDG was injected intravenously. Full-ring PET imaging was performed from the head to foot after the radiotracer. CT data was obtained and used for attenuation correction and anatomic localization. Fasting blood glucose: 88 mg/dl COMPARISON:  FDG PET scan 07/27/2019 FINDINGS: Mediastinal blood pool activity: SUV max 2.5 HEAD/NECK: Large field of photopenia in the LEFT frontal lobe corresponds to encephalomalacia on prior CT. Patient status post LEFT frontal craniotomy. No hypermetabolic lymph nodes or skin lesions in the head or neck. Incidental CT findings: none CHEST: No hypermetabolic mediastinal or hilar nodes. No suspicious pulmonary nodules on the CT scan. Incidental CT findings: Small focus of metabolic activity in the LEFT lobe of thyroid gland is unchanged (SUV  max equal 5.8). Post CABG ABDOMEN/PELVIS: No abnormal hypermetabolic activity within the liver, pancreas, adrenal glands, or spleen. No hypermetabolic lymph nodes in the abdomen or pelvis. Incidental CT findings: Prostate unremarkable. Atherosclerotic calcification of the aorta. SKELETON: No focal hypermetabolic activity to suggest skeletal metastasis. Incidental CT findings: none EXTREMITIES: No abnormal hypermetabolic activity in the lower extremities. Incidental CT findings: none IMPRESSION: 1. No evidence of metastatic melanoma on whole-body FDG PET scan. 2. LEFT frontal craniotomy. 3. Hypermetabolic activity in small LEFT thyroid nodules unchanged. Consider thyroid ultrasound. Electronically Signed   By: Suzy Bouchard M.D.   On: 12/11/2020 09:56   DG Chest Port 1 View  Result Date: 12/15/2020 CLINICAL DATA:  Altered mental status. EXAM: PORTABLE CHEST 1 VIEW COMPARISON:   Comparison made with January 08, 2019. FINDINGS: Three lead cardiac pacer device, power pack over LEFT chest, leads project over the heart as on the previous study. Signs of median sternotomy for CABG. Trachea midline. Cardiomediastinal contours and hilar structures are stable. No lobar consolidation. Linear opacities overlie the RIGHT and LEFT hemidiaphragm bilaterally. No visible pneumothorax. No overt sign of effusion. On limited assessment there is no acute skeletal process. EKG leads project over the chest. IMPRESSION: Linear opacities overlie the RIGHT and LEFT hemidiaphragm bilaterally, likely atelectasis. No consolidation, effusion or edema. Electronically Signed   By: Zetta Bills M.D.   On: 12/15/2020 16:18   EEG adult  Result Date: 12/15/2020 Lora Havens, MD     12/15/2020  4:56 PM Patient Name: David [name]. MRN: [id number] Epilepsy Attending: Lora Havens Referring Physician/Provider: Dr Lesleigh Noe Date: 12/15/2020 Duration: 20.35 mins Patient history: 80 year old male with history of CVA, ICH, melanoma with mets to lung and brain who presented with aphasia.  EEG to evaluate for seizures. Level of alertness: Awake AEDs during EEG study: LEV Technical aspects: This EEG study was done with scalp electrodes positioned according to the 10-20 International system of electrode placement. Electrical activity was acquired at a sampling rate of 500Hz  and reviewed with a high frequency filter of 70Hz  and a low frequency filter of 1Hz . EEG data were recorded continuously and digitally stored. Description: The posterior dominant rhythm consists of 8 Hz activity of moderate voltage (25-35 uV) seen predominantly in posterior head regions, symmetric and reactive to eye opening and eye closing.  EEG also showed continuous 3 to 5 Hz sharply contoured theta-delta slowing with overriding 15 to 18 Hz beta activity in left frontal region consistent with prior craniotomy. Seizure was noted arising  from left centro-temporal region on 12/15/2020 at 1629. During seizure, patient was noted to be aphasic at the beginning and then got confused and wanted to get out of the bed to urinate. Onset of seizure on 12/15/2020 at 1629, seizure was ongoing when study ended at 1634.  Concomitant EEG initially showed sharply contoured 13 to 14 Hz beta activity in left central temporal region which gradually involved all of left hemisphere followed by right hemisphere and evolved in morphology to 3 to 5 Hz theta- delta slowing.  Physiologic photic driving was not seen during photic stimulation.  Hyperventilation and photic stimulation were not performed.   ABNORMALITY -Focal seizure, left centro- temporal region -Breach artifact, left frontal region IMPRESSION: This study showed one seizure arising from left central temporal region on 12/15/2020 at 1629, was ongoing when the study ended at 1634.  During seizure, patient was initially not able to respond but then got confused and restless and wanting to get out  of bed to urinate.  EEG also showed evidence of cortical dysfunction in left frontal region consistent with prior craniotomy. If concern for ictal-interictal activity persist, recommend long-term monitoring. Dr Curly Shores was notified Lora Havens      Subjective: Patient seen and examined the bedside this morning.  He was walking with physical therapist.  He was ambulating well.  He was completely alert and oriented during my evaluation.  He was speaking in full sentences, no obvious slurred speech noted.    Discharge Exam: Vitals:   12/17/20 0802 12/17/20 1221  BP: 123/86 116/81  Pulse: 75 74  Resp: 16 16  Temp: 98 F (36.7 C) 97.9 F (36.6 C)  SpO2: 94% 96%   Vitals:   12/17/20 0001 12/17/20 0507 12/17/20 0802 12/17/20 1221  BP: 120/90 127/87 123/86 116/81  Pulse: 77 75 75 74  Resp: 16 17 16 16   Temp: 97.6 F (36.4 C) 98.3 F (36.8 C) 98 F (36.7 C) 97.9 F (36.6 C)  TempSrc:      SpO2: 97% 99%  94% 96%  Weight:      Height:        General: Pt is alert, awake, not in acute distress Cardiovascular: RRR, S1/S2 +, no rubs, no gallops Respiratory: CTA bilaterally, no wheezing, no rhonchi Abdominal: Soft, NT, ND, bowel sounds + Extremities: no edema, no cyanosis    The results of significant diagnostics from this hospitalization (including imaging, microbiology, ancillary and laboratory) are listed below for reference.     Microbiology: Recent Results (from the past 240 hour(s))  Resp Panel by RT-PCR (Flu A&B, Covid) Nasopharyngeal Swab     Status: None   Collection Time: 12/15/20  1:03 PM   Specimen: Nasopharyngeal Swab; Nasopharyngeal(NP) swabs in vial transport medium  Result Value Ref Range Status   SARS Coronavirus 2 by RT PCR NEGATIVE NEGATIVE Final    Comment: (NOTE) SARS-CoV-2 target nucleic acids are NOT DETECTED.  The SARS-CoV-2 RNA is generally detectable in upper respiratory specimens during the acute phase of infection. The lowest concentration of SARS-CoV-2 viral copies this assay can detect is 138 copies/mL. A negative result does not preclude SARS-Cov-2 infection and should not be used as the sole basis for treatment or other patient management decisions. A negative result may occur with  improper specimen collection/handling, submission of specimen other than nasopharyngeal swab, presence of viral mutation(s) within the areas targeted by this assay, and inadequate number of viral copies(<138 copies/mL). A negative result must be combined with clinical observations, patient history, and epidemiological information. The expected result is Negative.  Fact Sheet for Patients:  EntrepreneurPulse.com.au  Fact Sheet for Healthcare Providers:  IncredibleEmployment.be  This test is no t yet approved or cleared by the [place] FDA and  has been authorized for detection and/or diagnosis of SARS-CoV-2 by FDA under an  Emergency Use Authorization (EUA). This EUA will remain  in effect (meaning this test can be used) for the duration of the COVID-19 declaration under Section 564(b)(1) of the Act, 21 U.S.C.section 360bbb-3(b)(1), unless the authorization is terminated  or revoked sooner.       Influenza A by PCR NEGATIVE NEGATIVE Final   Influenza B by PCR NEGATIVE NEGATIVE Final    Comment: (NOTE) The Xpert Xpress SARS-CoV-2/FLU/RSV plus assay is intended as an aid in the diagnosis of influenza from Nasopharyngeal swab specimens and should not be used as a sole basis for treatment. Nasal washings and aspirates are unacceptable for Xpert Xpress SARS-CoV-2/FLU/RSV testing.  Fact Sheet for Patients: EntrepreneurPulse.com.au  Fact Sheet for Healthcare Providers: IncredibleEmployment.be  This test is not yet approved or cleared by the [place] FDA and has been authorized for detection and/or diagnosis of SARS-CoV-2 by FDA under an Emergency Use Authorization (EUA). This EUA will remain in effect (meaning this test can be used) for the duration of the COVID-19 declaration under Section 564(b)(1) of the Act, 21 U.S.C. section 360bbb-3(b)(1), unless the authorization is terminated or revoked.  Performed at [facility], [address] [place], [place] [address]      Labs: BNP (last 3 results) No results for input(s): BNP in the last 8760 hours. Basic Metabolic Panel: Recent Labs  Lab 12/14/20 1129 12/15/20 1206 12/15/20 1543 12/16/20 0456  NA 138 137  --  137  K 4.6 4.8  --  4.6  CL 103 103  --  103  CO2 24 24  --  23  GLUCOSE 126* 133*  --  88  BUN 48* 43*  --  43*  CREATININE 2.29* 2.07* 2.02* 1.84*  CALCIUM 9.0 9.3  --  9.1   Liver Function Tests: Recent Labs  Lab 12/14/20 1129 12/15/20 1206  AST 25 25  ALT 14 14  ALKPHOS 54 51  BILITOT 0.6 0.7  PROT 7.3 6.9  ALBUMIN 4.2 4.3   No results for input(s): LIPASE, AMYLASE in  the last 168 hours. Recent Labs  Lab 12/15/20 1543  AMMONIA 17   CBC: Recent Labs  Lab 12/14/20 1129 12/15/20 1206 12/16/20 0456  WBC 5.1 4.9 5.3  NEUTROABS 3.6 3.7  --   HGB 15.6 16.3 15.5  HCT 47.2 46.5 45.2  MCV 95.5 92.1 93.6  PLT 169 168 173   Cardiac Enzymes: No results for input(s): CKTOTAL, CKMB, CKMBINDEX, TROPONINI in the last 168 hours. BNP: Invalid input(s): POCBNP CBG: No results for input(s): GLUCAP in the last 168 hours.  D-Dimer No results for input(s): DDIMER in the last 72 hours. Hgb A1c Recent Labs    12/16/20 0456  HGBA1C 5.9*   Lipid Profile Recent Labs    12/16/20 0456  CHOL 138  HDL 43  LDLCALC 58  TRIG 185*  CHOLHDL 3.2   Thyroid function studies No results for input(s): TSH, T4TOTAL, T3FREE, THYROIDAB in the last 72 hours.  Invalid input(s): FREET3 Anemia work up Recent Labs    12/15/20 1543  VITAMINB12 383   Urinalysis    Component Value Date/Time   COLORURINE YELLOW (A) 12/15/2020 [place] (A) 12/15/2020 1645   LABSPEC 1.020 12/15/2020 1645   PHURINE 6.5 12/15/2020 [place] 12/15/2020 1645   GLUCOSEU NEGATIVE 10/12/2020 1118   HGBUR MODERATE (A) 12/15/2020 [place] 12/15/2020 1645   KETONESUR NEGATIVE 12/15/2020 1645   PROTEINUR 300 (A) 12/15/2020 1645   UROBILINOGEN 0.2 10/12/2020 1118   NITRITE NEGATIVE 12/15/2020 1645   LEUKOCYTESUR NEGATIVE 12/15/2020 1645   Sepsis Labs Invalid input(s): PROCALCITONIN,  WBC,  LACTICIDVEN Microbiology Recent Results (from the past 240 hour(s))  Resp Panel by RT-PCR (Flu A&B, Covid) Nasopharyngeal Swab     Status: None   Collection Time: 12/15/20  1:03 PM   Specimen: Nasopharyngeal Swab; Nasopharyngeal(NP) swabs in vial transport medium  Result Value Ref Range Status   SARS Coronavirus 2 by RT PCR NEGATIVE NEGATIVE Final    Comment: (NOTE) SARS-CoV-2 target nucleic acids are NOT DETECTED.  The SARS-CoV-2 RNA is generally  detectable in upper respiratory specimens during the  acute phase of infection. The lowest concentration of SARS-CoV-2 viral copies this assay can detect is 138 copies/mL. A negative result does not preclude SARS-Cov-2 infection and should not be used as the sole basis for treatment or other patient management decisions. A negative result may occur with  improper specimen collection/handling, submission of specimen other than nasopharyngeal swab, presence of viral mutation(s) within the areas targeted by this assay, and inadequate number of viral copies(<138 copies/mL). A negative result must be combined with clinical observations, patient history, and epidemiological information. The expected result is Negative.  Fact Sheet for Patients:  EntrepreneurPulse.com.au  Fact Sheet for Healthcare Providers:  IncredibleEmployment.be  This test is no t yet approved or cleared by the [place] FDA and  has been authorized for detection and/or diagnosis of SARS-CoV-2 by FDA under an Emergency Use Authorization (EUA). This EUA will remain  in effect (meaning this test can be used) for the duration of the COVID-19 declaration under Section 564(b)(1) of the Act, 21 U.S.C.section 360bbb-3(b)(1), unless the authorization is terminated  or revoked sooner.       Influenza A by PCR NEGATIVE NEGATIVE Final   Influenza B by PCR NEGATIVE NEGATIVE Final    Comment: (NOTE) The Xpert Xpress SARS-CoV-2/FLU/RSV plus assay is intended as an aid in the diagnosis of influenza from Nasopharyngeal swab specimens and should not be used as a sole basis for treatment. Nasal washings and aspirates are unacceptable for Xpert Xpress SARS-CoV-2/FLU/RSV testing.  Fact Sheet for Patients: EntrepreneurPulse.com.au  Fact Sheet for Healthcare Providers: IncredibleEmployment.be  This test is not yet approved or cleared by the [place] FDA  and has been authorized for detection and/or diagnosis of SARS-CoV-2 by FDA under an Emergency Use Authorization (EUA). This EUA will remain in effect (meaning this test can be used) for the duration of the COVID-19 declaration under Section 564(b)(1) of the Act, 21 U.S.C. section 360bbb-3(b)(1), unless the authorization is terminated or revoked.  Performed at [facility], [address] [place],  [address]     Please note: You were cared for by a hospitalist during your hospital stay. Once you are discharged, your primary care physician will handle any further medical issues. Please note that NO REFILLS for any discharge medications will be authorized once you are discharged, as it is imperative that you return to your primary care physician (or establish a relationship with a primary care physician if you do not have one) for your post hospital discharge needs so that they can reassess your need for medications and monitor your lab values.    Time coordinating discharge: 40 minutes  SIGNED:   Shelly Coss, MD  [facility] 12/17/2020, 12:52 PM Pager [phone]  If 7PM-7AM, please contact night-coverage [email or web address] Password [email or web address]

## 2020-12-17 ENCOUNTER — Inpatient Hospital Stay: Payer: Medicare Other | Admitting: Internal Medicine

## 2020-12-17 DIAGNOSIS — R410 Disorientation, unspecified: Secondary | ICD-10-CM | POA: Diagnosis not present

## 2020-12-17 DIAGNOSIS — I255 Ischemic cardiomyopathy: Secondary | ICD-10-CM | POA: Diagnosis present

## 2020-12-17 DIAGNOSIS — I69120 Aphasia following nontraumatic intracerebral hemorrhage: Secondary | ICD-10-CM | POA: Diagnosis not present

## 2020-12-17 DIAGNOSIS — R569 Unspecified convulsions: Secondary | ICD-10-CM | POA: Diagnosis present

## 2020-12-17 DIAGNOSIS — G40109 Localization-related (focal) (partial) symptomatic epilepsy and epileptic syndromes with simple partial seizures, not intractable, without status epilepticus: Secondary | ICD-10-CM | POA: Diagnosis not present

## 2020-12-17 DIAGNOSIS — I442 Atrioventricular block, complete: Secondary | ICD-10-CM | POA: Diagnosis present

## 2020-12-17 DIAGNOSIS — Z9989 Dependence on other enabling machines and devices: Secondary | ICD-10-CM | POA: Diagnosis not present

## 2020-12-17 DIAGNOSIS — Z8261 Family history of arthritis: Secondary | ICD-10-CM | POA: Diagnosis not present

## 2020-12-17 DIAGNOSIS — E039 Hypothyroidism, unspecified: Secondary | ICD-10-CM | POA: Diagnosis present

## 2020-12-17 DIAGNOSIS — Z801 Family history of malignant neoplasm of trachea, bronchus and lung: Secondary | ICD-10-CM | POA: Diagnosis not present

## 2020-12-17 DIAGNOSIS — R401 Stupor: Secondary | ICD-10-CM | POA: Diagnosis not present

## 2020-12-17 DIAGNOSIS — C61 Malignant neoplasm of prostate: Secondary | ICD-10-CM | POA: Diagnosis present

## 2020-12-17 DIAGNOSIS — Z4659 Encounter for fitting and adjustment of other gastrointestinal appliance and device: Secondary | ICD-10-CM | POA: Diagnosis not present

## 2020-12-17 DIAGNOSIS — C7801 Secondary malignant neoplasm of right lung: Secondary | ICD-10-CM | POA: Diagnosis present

## 2020-12-17 DIAGNOSIS — R609 Edema, unspecified: Secondary | ICD-10-CM | POA: Diagnosis not present

## 2020-12-17 DIAGNOSIS — R4701 Aphasia: Secondary | ICD-10-CM | POA: Diagnosis not present

## 2020-12-17 DIAGNOSIS — N19 Unspecified kidney failure: Secondary | ICD-10-CM | POA: Diagnosis not present

## 2020-12-17 DIAGNOSIS — G4733 Obstructive sleep apnea (adult) (pediatric): Secondary | ICD-10-CM | POA: Diagnosis present

## 2020-12-17 DIAGNOSIS — F331 Major depressive disorder, recurrent, moderate: Secondary | ICD-10-CM | POA: Diagnosis present

## 2020-12-17 DIAGNOSIS — I25118 Atherosclerotic heart disease of native coronary artery with other forms of angina pectoris: Secondary | ICD-10-CM | POA: Diagnosis present

## 2020-12-17 DIAGNOSIS — Z66 Do not resuscitate: Secondary | ICD-10-CM | POA: Diagnosis present

## 2020-12-17 DIAGNOSIS — N179 Acute kidney failure, unspecified: Secondary | ICD-10-CM | POA: Diagnosis present

## 2020-12-17 DIAGNOSIS — Z950 Presence of cardiac pacemaker: Secondary | ICD-10-CM | POA: Diagnosis not present

## 2020-12-17 DIAGNOSIS — E7800 Pure hypercholesterolemia, unspecified: Secondary | ICD-10-CM | POA: Diagnosis present

## 2020-12-17 DIAGNOSIS — I252 Old myocardial infarction: Secondary | ICD-10-CM | POA: Diagnosis not present

## 2020-12-17 DIAGNOSIS — Z8616 Personal history of COVID-19: Secondary | ICD-10-CM | POA: Diagnosis not present

## 2020-12-17 DIAGNOSIS — Z8249 Family history of ischemic heart disease and other diseases of the circulatory system: Secondary | ICD-10-CM | POA: Diagnosis not present

## 2020-12-17 DIAGNOSIS — I69251 Hemiplegia and hemiparesis following other nontraumatic intracranial hemorrhage affecting right dominant side: Secondary | ICD-10-CM | POA: Diagnosis not present

## 2020-12-17 DIAGNOSIS — Z85820 Personal history of malignant melanoma of skin: Secondary | ICD-10-CM | POA: Diagnosis not present

## 2020-12-17 DIAGNOSIS — R509 Fever, unspecified: Secondary | ICD-10-CM | POA: Diagnosis not present

## 2020-12-17 DIAGNOSIS — R7881 Bacteremia: Secondary | ICD-10-CM | POA: Diagnosis not present

## 2020-12-17 DIAGNOSIS — I6389 Other cerebral infarction: Secondary | ICD-10-CM | POA: Diagnosis not present

## 2020-12-17 DIAGNOSIS — N1832 Chronic kidney disease, stage 3b: Secondary | ICD-10-CM | POA: Diagnosis present

## 2020-12-17 DIAGNOSIS — I129 Hypertensive chronic kidney disease with stage 1 through stage 4 chronic kidney disease, or unspecified chronic kidney disease: Secondary | ICD-10-CM | POA: Diagnosis present

## 2020-12-17 DIAGNOSIS — C9241 Acute promyelocytic leukemia, in remission: Secondary | ICD-10-CM | POA: Diagnosis present

## 2020-12-17 DIAGNOSIS — I10 Essential (primary) hypertension: Secondary | ICD-10-CM | POA: Diagnosis not present

## 2020-12-17 DIAGNOSIS — N400 Enlarged prostate without lower urinary tract symptoms: Secondary | ICD-10-CM | POA: Diagnosis present

## 2020-12-17 DIAGNOSIS — C7931 Secondary malignant neoplasm of brain: Secondary | ICD-10-CM | POA: Diagnosis present

## 2020-12-17 LAB — CBC WITH DIFFERENTIAL/PLATELET
Abs Immature Granulocytes: 0.03 10*3/uL (ref 0.00–0.07)
Basophils Absolute: 0 10*3/uL (ref 0.0–0.1)
Basophils Relative: 1 %
Eosinophils Absolute: 0.3 10*3/uL (ref 0.0–0.5)
Eosinophils Relative: 6 %
HCT: 45.7 % (ref 39.0–52.0)
Hemoglobin: 15.6 g/dL (ref 13.0–17.0)
Immature Granulocytes: 1 %
Lymphocytes Relative: 11 %
Lymphs Abs: 0.7 10*3/uL (ref 0.7–4.0)
MCH: 32.4 pg (ref 26.0–34.0)
MCHC: 34.1 g/dL (ref 30.0–36.0)
MCV: 94.8 fL (ref 80.0–100.0)
Monocytes Absolute: 0.5 10*3/uL (ref 0.1–1.0)
Monocytes Relative: 9 %
Neutro Abs: 4.2 10*3/uL (ref 1.7–7.7)
Neutrophils Relative %: 72 %
Platelets: 165 10*3/uL (ref 150–400)
RBC: 4.82 MIL/uL (ref 4.22–5.81)
RDW: 13.9 % (ref 11.5–15.5)
WBC: 5.7 10*3/uL (ref 4.0–10.5)
nRBC: 0 % (ref 0.0–0.2)

## 2020-12-17 LAB — COMPREHENSIVE METABOLIC PANEL
ALT: 12 U/L (ref 0–44)
AST: 17 U/L (ref 15–41)
Albumin: 3.7 g/dL (ref 3.5–5.0)
Alkaline Phosphatase: 48 U/L (ref 38–126)
Anion gap: 6 (ref 5–15)
BUN: 45 mg/dL — ABNORMAL HIGH (ref 8–23)
CO2: 27 mmol/L (ref 22–32)
Calcium: 9.1 mg/dL (ref 8.9–10.3)
Chloride: 103 mmol/L (ref 98–111)
Creatinine, Ser: 1.85 mg/dL — ABNORMAL HIGH (ref 0.61–1.24)
GFR, Estimated: 36 mL/min — ABNORMAL LOW (ref >60)
Glucose, Bld: 87 mg/dL (ref 70–99)
Potassium: 4.8 mmol/L (ref 3.5–5.1)
Sodium: 136 mmol/L (ref 135–145)
Total Bilirubin: 0.6 mg/dL (ref 0.3–1.2)
Total Protein: 6.5 g/dL (ref 6.5–8.1)

## 2020-12-17 LAB — VALPROIC ACID LEVEL: Valproic Acid Lvl: 47 ug/mL — ABNORMAL LOW (ref 50.0–100.0)

## 2020-12-17 LAB — VITAMIN B1: Vitamin B1 (Thiamine): 188.8 nmol/L (ref 66.5–200.0)

## 2020-12-17 LAB — AMMONIA: Ammonia: 18 umol/L (ref 9–35)

## 2020-12-17 MED ORDER — CLOBAZAM 10 MG PO TABS
5.0000 mg | ORAL_TABLET | Freq: Every day | ORAL | Status: DC
  Administered 2020-12-17: 5 mg via ORAL
  Filled 2020-12-17: qty 1

## 2020-12-17 MED ORDER — DIVALPROEX SODIUM 500 MG PO DR TAB
500.0000 mg | DELAYED_RELEASE_TABLET | Freq: Three times a day (TID) | ORAL | Status: DC
  Administered 2020-12-17: 500 mg via ORAL
  Filled 2020-12-17 (×3): qty 1

## 2020-12-17 MED ORDER — DIVALPROEX SODIUM 500 MG PO DR TAB: 500.0000 mg | DELAYED_RELEASE_TABLET | Freq: Three times a day (TID) | ORAL | Status: AC

## 2020-12-17 MED ORDER — DIVALPROEX SODIUM 125 MG PO CSDR
750.0000 mg | DELAYED_RELEASE_CAPSULE | Freq: Once | ORAL | Status: AC
  Administered 2020-12-17: 11:00:00 750 mg via ORAL
  Filled 2020-12-17: qty 6

## 2020-12-17 NOTE — Progress Notes (Signed)
Eeg done 

## 2020-12-17 NOTE — Progress Notes (Signed)
Neurology Progress Note  Patient ID: David [name]. is a 80 y.o. right-handed male with past medical history significant for melanoma with metastasis to left frontal lobe complicated by hemorrhage in 2017 (s/p resection and radiation), APML, hypertension, hypercholesterolemia, sleep apnea, CAD s/p CABG (2012), AV node dysfunction s/p pacemaker.   Initially consulted for: Intermittent aphasia, found to have focal seizures initially felt to be focal aware but then with concern for some focal unaware seizures as well.  Not adequately controlled on maximal dose of Keppra started 9/7 therefore Depakote was added on 9/8  Semiology is subtle (speech arrest, confusion, difficulty responding), and there is perhaps a prodrome of metallic taste and decreased sense of smell at times  Major interval events/Subjective: -Tolerating addition of Depakote well, received oral loading dose due to IV formulation shortages at 3 PM, but unfortunately had a witnessed event by nursing at 5:30 PM at which time he reported a prodrome of metallic taste and decreased sense of smell - Difficultly speaking during my eval this morning, which did resolve after which he was able to name appropriately and denied any other complaints other than some slight dizziness. He notes he does not always have the taste prodrome with events.  Exam: Vitals:   12/17/20 0507 12/17/20 0802  BP: 127/87 123/86  Pulse: 75 75  Resp: 17 16  Temp: 98.3 F (36.8 C) 98 F (36.7 C)  SpO2: 99% 94%   Gen: In bed, comfortable  Resp: non-labored breathing, no grossly audible wheezing Cardiac: Perfusing extremities well  Abd: soft, nt  Neuro: MS: Initially could not name thumb or produce much speech though still following simple commands. Aware of his difficulty talking. Within 2-3 minutes this resolved, he still seemed dazed but was able to name and repeat appropriately and much clearer in communication.  CN: More prominent right facial  droop, EOMI notable for some slightly left beating nystagmus on left gaze, with mild bilateral ptosis, tongue midline Motor: Mild pronator drift RUE, as well as slight RLE drift today with clearly much more effortful movement on the right than baseline Reports dizziness on sitting at the side of the bed  Pertinent Labs: B12 383 Creatinine improving to 1.84 on 9/8  Impression: Focal aware and unaware seizures, currently uncontrolled.  Given that he is not able to accurately report the number of events he is having, and on further history daughter reports he has been having good days and bad days for a while concerning for potential longstanding seizures potentially meeting criteria for focal status epilepticus (seizures without return to baseline at this time), will plan to transfer to [facility] for long-term EEG monitoring to assist with AED titration  Recommendations:  #Focal aware seizures, localization-related, uncontrolled -Transfer to [facility] for long-term EEG, Dr. Cheral Marker and Dr. Hortense Ramal aware and neurology will follow in consultation; will repeat EEG here today as long as resources allow while awaiting bed availability -Continue keppra 750 mg twice daily, especially given how well he is tolerating medication Estimated Creatinine Clearance: 34.4 mL/min (A) (by C-G formula based on SCr of 1.84 mg/dL (H)).   CrCl 80 to 130 mL/minute/1.73 m2: 500 mg to 1.5 g every 12 hours.  CrCl 50 to <80 mL/minute/1.73 m2: 500 mg to 1 g every 12 hours.  CrCl 30 to <50 mL/minute/1.73 m2: 250 to 750 mg every 12 hours.  CrCl 15 to <30 mL/minute/1.73 m2: 250 to 500 mg every 12 hours.  CrCl <15 mL/minute/1.73 m2: 250 to  500 mg every 24 hours (expert opinion). -depakote 750 mg oral loading dose x 2 (9/8 PM and 9/9 AM) (given critical shortage of IV formulation) and increase from 250 mg to 500 q8hr maintenance dose -CBC, CMP, Ammonia and trough VPA level for drug safety monitoring  tonight -Due to Ativan shortage, diazepam 5 mg IV for GTC lasting greater than 5 minutes and notify neurology -Daughter was updated as was Dr. Tawanna Solo, additionally discussed with Dr. Hortense Ramal and Dr. Cheral Marker, -Will consider discontinuation of methylphenidate as this can lower seizure threshold, hold for now  -Discontinuing trazodone today due to continued seizures and increase of depakote -Management of other comorbidities per primary team  #Borderline B12 -Methylmalonic acid level ordered, pending -Empiric 1000 mg daily B12 oral, may be discontinued if MMA results within normal limits   Standard seizure precautions: Per [facility] statutes, patients with seizures are not allowed to drive until  they have been seizure-free for six months. Use caution when using heavy equipment or power tools. Avoid working on ladders or at heights. Take showers instead of baths. Ensure the water temperature is not too high on the home water heater. Do not go swimming alone. When caring for infants or small children, sit down when holding, feeding, or changing them to minimize risk of injury to the child in the event you have a seizure.  To reduce risk of seizures, maintain good sleep hygiene avoid alcohol and illicit drug use, take all anti-seizure medications as prescribed.   Lesleigh Noe MD-PhD [facility] [phone]  [facility] coverage for [facility] is from 8 AM to 4 AM in-house and 4 PM to 8 PM by telephone/video. 8 PM to 8 AM emergent questions or overnight urgent questions should be addressed to Teleneurology On-call or [facility] neurohospitalist; contact information can be found on AMION  Greater than 35 minutes were spent in the care of this patient today, of which greater than 50% was at bedside

## 2020-12-17 NOTE — Progress Notes (Addendum)
1115 - [facility] called for bed availability 1125 - MD notifiied of transfer, emtala and medical necessities form 1130 - Report called to [facility] on 3W at [facility]  Pt informed of transfer and endorsed understanding  Report given to [facility] staff at 215am

## 2020-12-17 NOTE — Progress Notes (Signed)
PROGRESS NOTE    David [name].  YJE:[id number] DOB: [birth year] DOA: 12/15/2020 PCP: [name], MD   Chief Complain:Slurred speech  Brief Narrative:  Patient is a 80 year old male with history of metastatic melanoma left frontal lobe metastasis with left frontal lobe craniotomy with resection of metastatic melanoma, adenocarcinoma of the prostate, CAD status post CABG, CKD 3B, gout, depression, anxiety, acquired hypothyroid, hypertension, hyperlipidemia, presents emergency department for chief concerns of confusion, slurred speech.  On presentation he was hemodynamically stable.  He was also confused at home.  CT head did not show any acute intracranial abnormalities.  Neurology consulted.  EEG done which showed seizure activity in the left central temporal region.  Loaded with Keppra, currently on IV Keppra.  Due to the need of LTM EEG, he has been transferred to [facility].  Currently his mental status has much improved.  Currently alert and oriented,but word finding difficulty/slurred speech still there.  He is waiting for bed at [facility].    Assessment & Plan:   Principal Problem:   Confusion Active Problems:   ICH (intracerebral hemorrhage) (HCC) - L frontal due to HTN vs CAA   Benign essential HTN   OSA on CPAP   Biventricular ICD (implantable cardioverter-defibrillator) in place   Hemorrhagic stroke ([place])   Hemiplegia and hemiparesis following nontraumatic intracerebral hemorrhage affecting right dominant side (HCC)   Aphasia following nontraumatic intracerebral hemorrhage   Chronic kidney disease, stage III (moderate) (HCC)   Lung mass   Malignant melanoma (HCC)   Metastasis to brain ([place])   Metastatic melanoma to lung (HCC)   MDD (major depressive disorder), recurrent episode, moderate (HCC)   Benign prostatic hyperplasia   Acquired hypothyroidism   Atherosclerosis of native coronary artery of native heart with stable angina pectoris ([place])   Ischemic cardiomyopathy    Cognitive and neurobehavioral dysfunction following brain injury ([place])   Hx of CABG   Prostate cancer ([place])   Dyslipidemia   Hypogonadism in male   Heart block AV complete ([place])   APML (acute promyelocytic leukemia) in remission ([place])   DNR (do not resuscitate)   Intermittent memory loss  Altered mental status/seizure: Presented with confusion, slurred speech.  Lives with family at home.  Ambulatory at baseline.  No  new focal neurological deficits on presentation.  CT head did not show any acute intracranial normalities.  EEG showed seizure activity in the left central temporal region.  Loaded with Keppra, currently on IV Keppra and depakote. As per neurology, he needs LTM EEG.  Neurology with follow up at [facility]. Currently he is alert and oriented, has intermittent word finding difficulty/slurred speech. He takes Ritalin at home which has been discontinued.   History of metastatic melanoma with metastasis to the lingula, right upper lung, supraventricular clavicular lymph node, left frontal lobe metastasis : Status post left frontal craniotomy with resection of metastatic melanoma We recommend to follow-up with oncology as an outpatient.   Major depressive disorder: On sertraline 50 mg daily, trazodone 25 to 50 mg p.o. daily at bedtime   Acquired hypothyroidism : On levothyroxine 137 mcg daily in the morning resumed   History of hemiplegia and hemiparesis following nontraumatic intracranial hemorrhage affecting right dominant side: Stable.  Ambulates with help of walker/cane at home   Hypertension: On amlodipine 2.5 mg p.o. daily , metoprolol succinate 75 mg daily, resumed   Hyperlipidemia: On rosuvastatin 20 mg daily   OSA :on CPAP   History of CAD status post CABG H/O Atherosclerosis of  native coronary artery with stable angina - Status post LIMA to the LAD, SVG to the PDA   H/O Acute promyelocytic leukemia: Currently in remission, outpatient follow-up   History  of complete AV block status post pacemaker: Status post biventricular ICD     CKD 3B: Kidney function at baseline         DVT prophylaxis:Lovenox Code Status: DNR Family Communication: Called  his [name] for update on 12/17/20 Status is: Observation  Dispo: The patient is from: Home              Anticipated d/c is to: Home              Patient currently is not medically stable to d/c.   Difficult to place patient No     Consultants: Neurology  Procedures: EEG  Antimicrobials:  Anti-infectives (From admission, onward)    None       Subjective: Patient seen and examined at the bedside this morning.  Hemodynamically stable.    He again has developed difficulty/slurred speech and could not express much.  Does not have any focal neurological deficits.  Obeys commands, alert and awake   Objective: Vitals:   12/16/20 2007 12/17/20 0001 12/17/20 0507 12/17/20 0802  BP: 122/87 120/90 127/87 123/86  Pulse: 75 77 75 75  Resp: 18 16 17 16   Temp: 98 F (36.7 C) 97.6 F (36.4 C) 98.3 F (36.8 C) 98 F (36.7 C)  TempSrc:      SpO2: 95% 97% 99% 94%  Weight:      Height:        Intake/Output Summary (Last 24 hours) at 12/17/2020 1035 Last data filed at 12/17/2020 1013 Gross per 24 hour  Intake 700 ml  Output 675 ml  Net 25 ml   Filed Weights   12/15/20 1203  Weight: 90.7 kg    Examination:  General exam: Overall comfortable, not in distress HEENT: PERRL Respiratory system:  no wheezes or crackles  Cardiovascular system: S1 & S2 heard, RRR.  Gastrointestinal system: Abdomen is nondistended, soft and nontender. Central nervous system: Alert and awake, dysarthria/expressive aphasia Extremities: No edema, no clubbing ,no cyanosis Skin: No rashes, no ulcers,no icterus       Data Reviewed: I have personally reviewed following labs and imaging studies  CBC: Recent Labs  Lab 12/14/20 1129 12/15/20 1206 12/16/20 0456  WBC 5.1 4.9 5.3  NEUTROABS 3.6  3.7  --   HGB 15.6 16.3 15.5  HCT 47.2 46.5 45.2  MCV 95.5 92.1 93.6  PLT 169 168 818   Basic Metabolic Panel: Recent Labs  Lab 12/14/20 1129 12/15/20 1206 12/15/20 1543 12/16/20 0456  NA 138 137  --  137  K 4.6 4.8  --  4.6  CL 103 103  --  103  CO2 24 24  --  23  GLUCOSE 126* 133*  --  88  BUN 48* 43*  --  43*  CREATININE 2.29* 2.07* 2.02* 1.84*  CALCIUM 9.0 9.3  --  9.1   GFR: Estimated Creatinine Clearance: 34.4 mL/min (A) (by C-G formula based on SCr of 1.84 mg/dL (H)). Liver Function Tests: Recent Labs  Lab 12/14/20 1129 12/15/20 1206  AST 25 25  ALT 14 14  ALKPHOS 54 51  BILITOT 0.6 0.7  PROT 7.3 6.9  ALBUMIN 4.2 4.3   No results for input(s): LIPASE, AMYLASE in the last 168 hours. Recent Labs  Lab 12/15/20 1543  AMMONIA 17   Coagulation Profile: Recent  Labs  Lab 12/15/20 1206  INR 1.0   Cardiac Enzymes: No results for input(s): CKTOTAL, CKMB, CKMBINDEX, TROPONINI in the last 168 hours. BNP (last 3 results) No results for input(s): PROBNP in the last 8760 hours. HbA1C: Recent Labs    12/16/20 0456  HGBA1C 5.9*   CBG: No results for input(s): GLUCAP in the last 168 hours. Lipid Profile: Recent Labs    12/16/20 0456  CHOL 138  HDL 43  LDLCALC 58  TRIG 185*  CHOLHDL 3.2   Thyroid Function Tests: No results for input(s): TSH, T4TOTAL, FREET4, T3FREE, THYROIDAB in the last 72 hours. Anemia Panel: Recent Labs    12/15/20 1543  VITAMINB12 383   Sepsis Labs: No results for input(s): PROCALCITON, LATICACIDVEN in the last 168 hours.  Recent Results (from the past 240 hour(s))  Resp Panel by RT-PCR (Flu A&B, Covid) Nasopharyngeal Swab     Status: None   Collection Time: 12/15/20  1:03 PM   Specimen: Nasopharyngeal Swab; Nasopharyngeal(NP) swabs in vial transport medium  Result Value Ref Range Status   SARS Coronavirus 2 by RT PCR NEGATIVE NEGATIVE Final    Comment: (NOTE) SARS-CoV-2 target nucleic acids are NOT DETECTED.  The  SARS-CoV-2 RNA is generally detectable in upper respiratory specimens during the acute phase of infection. The lowest concentration of SARS-CoV-2 viral copies this assay can detect is 138 copies/mL. A negative result does not preclude SARS-Cov-2 infection and should not be used as the sole basis for treatment or other patient management decisions. A negative result may occur with  improper specimen collection/handling, submission of specimen other than nasopharyngeal swab, presence of viral mutation(s) within the areas targeted by this assay, and inadequate number of viral copies(<138 copies/mL). A negative result must be combined with clinical observations, patient history, and epidemiological information. The expected result is Negative.  Fact Sheet for Patients:  EntrepreneurPulse.com.au  Fact Sheet for Healthcare Providers:  IncredibleEmployment.be  This test is no t yet approved or cleared by the [place] FDA and  has been authorized for detection and/or diagnosis of SARS-CoV-2 by FDA under an Emergency Use Authorization (EUA). This EUA will remain  in effect (meaning this test can be used) for the duration of the COVID-19 declaration under Section 564(b)(1) of the Act, 21 U.S.C.section 360bbb-3(b)(1), unless the authorization is terminated  or revoked sooner.       Influenza A by PCR NEGATIVE NEGATIVE Final   Influenza B by PCR NEGATIVE NEGATIVE Final    Comment: (NOTE) The Xpert Xpress SARS-CoV-2/FLU/RSV plus assay is intended as an aid in the diagnosis of influenza from Nasopharyngeal swab specimens and should not be used as a sole basis for treatment. Nasal washings and aspirates are unacceptable for Xpert Xpress SARS-CoV-2/FLU/RSV testing.  Fact Sheet for Patients: EntrepreneurPulse.com.au  Fact Sheet for Healthcare Providers: IncredibleEmployment.be  This test is not yet approved or  cleared by the [place] FDA and has been authorized for detection and/or diagnosis of SARS-CoV-2 by FDA under an Emergency Use Authorization (EUA). This EUA will remain in effect (meaning this test can be used) for the duration of the COVID-19 declaration under Section 564(b)(1) of the Act, 21 U.S.C. section 360bbb-3(b)(1), unless the authorization is terminated or revoked.  Performed at [facility], [address], [place], [place] [address]          Radiology Studies: CT HEAD WO CONTRAST  Result Date: 12/15/2020 CLINICAL DATA:  History of CVA, concern for TIAs EXAM: CT HEAD WITHOUT CONTRAST TECHNIQUE:  Contiguous axial images were obtained from the base of the skull through the vertex without intravenous contrast. COMPARISON:  11/22/2020 FINDINGS: Brain: Status post left frontal craniotomy with subjacent encephalomalacia. Unchanged appearance of the resection cavity. Unchanged ex vacuo dilatation of the frontal horn of the left lateral ventricle. Evidence of acute infarction, hemorrhage, hydrocephalus, extra-axial collection, mass, mass effect, or midline shift. Vascular: No hyperdense vessel or unexpected calcification. Skull: Prior left frontal craniotomy. No aggressive osseous lesions. Sinuses/Orbits: Left scleral band. Status post bilateral lens replacements. Normal paranasal sinuses. Other: The mastoids are well aerated. IMPRESSION: No acute intracranial process. Electronically Signed   By: Merilyn Baba M.D.   On: 12/15/2020 13:46   DG Chest Port 1 View  Result Date: 12/15/2020 CLINICAL DATA:  Altered mental status. EXAM: PORTABLE CHEST 1 VIEW COMPARISON:  Comparison made with January 08, 2019. FINDINGS: Three lead cardiac pacer device, power pack over LEFT chest, leads project over the heart as on the previous study. Signs of median sternotomy for CABG. Trachea midline. Cardiomediastinal contours and hilar structures are stable. No lobar consolidation. Linear opacities  overlie the RIGHT and LEFT hemidiaphragm bilaterally. No visible pneumothorax. No overt sign of effusion. On limited assessment there is no acute skeletal process. EKG leads project over the chest. IMPRESSION: Linear opacities overlie the RIGHT and LEFT hemidiaphragm bilaterally, likely atelectasis. No consolidation, effusion or edema. Electronically Signed   By: Zetta Bills M.D.   On: 12/15/2020 16:18   EEG adult  Result Date: 12/15/2020 Lora Havens, MD     12/15/2020  4:56 PM Patient Name: David [name]. MRN: [id number] Epilepsy Attending: Lora Havens Referring Physician/Provider: Dr Lesleigh Noe Date: 12/15/2020 Duration: 20.35 mins Patient history: 80 year old male with history of CVA, ICH, melanoma with mets to lung and brain who presented with aphasia.  EEG to evaluate for seizures. Level of alertness: Awake AEDs during EEG study: LEV Technical aspects: This EEG study was done with scalp electrodes positioned according to the 10-20 International system of electrode placement. Electrical activity was acquired at a sampling rate of 500Hz  and reviewed with a high frequency filter of 70Hz  and a low frequency filter of 1Hz . EEG data were recorded continuously and digitally stored. Description: The posterior dominant rhythm consists of 8 Hz activity of moderate voltage (25-35 uV) seen predominantly in posterior head regions, symmetric and reactive to eye opening and eye closing.  EEG also showed continuous 3 to 5 Hz sharply contoured theta-delta slowing with overriding 15 to 18 Hz beta activity in left frontal region consistent with prior craniotomy. Seizure was noted arising from left centro-temporal region on 12/15/2020 at 1629. During seizure, patient was noted to be aphasic at the beginning and then got confused and wanted to get out of the bed to urinate. Onset of seizure on 12/15/2020 at 1629, seizure was ongoing when study ended at 1634.  Concomitant EEG initially showed sharply contoured 13  to 14 Hz beta activity in left central temporal region which gradually involved all of left hemisphere followed by right hemisphere and evolved in morphology to 3 to 5 Hz theta- delta slowing.  Physiologic photic driving was not seen during photic stimulation.  Hyperventilation and photic stimulation were not performed.   ABNORMALITY -Focal seizure, left centro- temporal region -Breach artifact, left frontal region IMPRESSION: This study showed one seizure arising from left central temporal region on 12/15/2020 at 1629, was ongoing when the study ended at 1634.  During seizure, patient was initially not able to respond  but then got confused and restless and wanting to get out of bed to urinate.  EEG also showed evidence of cortical dysfunction in left frontal region consistent with prior craniotomy. If concern for ictal-interictal activity persist, recommend long-term monitoring. Dr Curly Shores was notified Lora Havens        Scheduled Meds:  allopurinol  200 mg Oral Daily   amLODipine  2.5 mg Oral Daily   aspirin EC  81 mg Oral QPM   divalproex  750 mg Oral Once   divalproex  500 mg Oral Q8H   enoxaparin (LOVENOX) injection  40 mg Subcutaneous QHS   furosemide  20 mg Oral QODAY   levothyroxine  137 mcg Oral Q0600   metoprolol succinate  75 mg Oral Daily   rosuvastatin  20 mg Oral Daily   sertraline  50 mg Oral Daily   vitamin B-12  1,000 mcg Oral Daily   Continuous Infusions:  levETIRAcetam 750 mg (12/17/20 1023)     LOS: 0 days    Time spent: 35 mins.More than 50% of that time was spent in counseling and/or coordination of care.      Shelly Coss, MD [facility] P9/12/2020, 10:35 AM

## 2020-12-17 NOTE — Procedures (Addendum)
Patient Name: David [name].  MRN: [id number]  Epilepsy Attending: Lora Havens  Referring Physician/Provider: Dr Lesleigh Noe Duration: 12/17/2020 1641 to [place]   Patient history: 80 year old male with history of CVA, ICH, melanoma with mets to lung and brain who presented with aphasia.  EEG to evaluate for seizures.   Level of alertness: Awake, asleep   AEDs during EEG study: LEV, VPA   Technical aspects: This EEG study was done with scalp electrodes positioned according to the 10-20 International system of electrode placement. Electrical activity was acquired at a sampling rate of 500Hz  and reviewed with a high frequency filter of 70Hz  and a low frequency filter of 1Hz . EEG data were recorded continuously and digitally stored.    Description: The posterior dominant rhythm consists of 8 Hz activity of moderate voltage (25-35 uV) seen predominantly in posterior head regions, symmetric and reactive to eye opening and eye closing. Sleep was characterized by vertex waves, sleep spindles (12-14Hz ), maximal fronto-central region. EEG also showed continuous 3 to 5 Hz sharply contoured theta-delta slowing with overriding 15 to 18 Hz beta activity in left frontal region consistent with prior craniotomy.    Seizure was noted arising from left centro-temporal region without clinical signs were noted. Onset of seizure on 12/17/2020 at 1742, ended at 1753. Concomitant EEG initially showed sharply contoured 13 to 14 Hz beta activity in left central temporal region which gradually involved all of left hemisphere followed by right hemisphere and evolved in morphology to 3 to 5 Hz theta- delta slowing.  Physiologic photic driving was not seen during photic stimulation.  Hyperventilation was not performed.      ABNORMALITY -Focal seizure, left centro- temporal region -Breach artifact, left frontal region   IMPRESSION: This study showed one seizure without clinical signs arising from left central  temporal region on 12/17/2020 at 1742, ended at 1753. EEG also showed evidence of cortical dysfunction in left frontal region consistent with prior craniotomy.    Dr Curly Shores was notified   Lora Havens

## 2020-12-18 ENCOUNTER — Inpatient Hospital Stay (HOSPITAL_COMMUNITY): Payer: Medicare Other

## 2020-12-18 ENCOUNTER — Inpatient Hospital Stay (HOSPITAL_COMMUNITY)
Admission: AD | Admit: 2020-12-18 | Discharge: 2021-01-08 | DRG: 100 | Disposition: E | Payer: Medicare Other | Source: Other Acute Inpatient Hospital | Attending: Internal Medicine | Admitting: Internal Medicine

## 2020-12-18 ENCOUNTER — Encounter (HOSPITAL_COMMUNITY): Payer: Self-pay | Admitting: Internal Medicine

## 2020-12-18 DIAGNOSIS — Z9221 Personal history of antineoplastic chemotherapy: Secondary | ICD-10-CM

## 2020-12-18 DIAGNOSIS — G40109 Localization-related (focal) (partial) symptomatic epilepsy and epileptic syndromes with simple partial seizures, not intractable, without status epilepticus: Secondary | ICD-10-CM

## 2020-12-18 DIAGNOSIS — I6523 Occlusion and stenosis of bilateral carotid arteries: Secondary | ICD-10-CM | POA: Diagnosis not present

## 2020-12-18 DIAGNOSIS — C7949 Secondary malignant neoplasm of other parts of nervous system: Secondary | ICD-10-CM | POA: Diagnosis not present

## 2020-12-18 DIAGNOSIS — N183 Chronic kidney disease, stage 3 unspecified: Secondary | ICD-10-CM | POA: Diagnosis present

## 2020-12-18 DIAGNOSIS — I639 Cerebral infarction, unspecified: Secondary | ICD-10-CM

## 2020-12-18 DIAGNOSIS — R4781 Slurred speech: Secondary | ICD-10-CM | POA: Diagnosis not present

## 2020-12-18 DIAGNOSIS — F329 Major depressive disorder, single episode, unspecified: Secondary | ICD-10-CM | POA: Diagnosis present

## 2020-12-18 DIAGNOSIS — Z87891 Personal history of nicotine dependence: Secondary | ICD-10-CM

## 2020-12-18 DIAGNOSIS — E669 Obesity, unspecified: Secondary | ICD-10-CM | POA: Diagnosis present

## 2020-12-18 DIAGNOSIS — R059 Cough, unspecified: Secondary | ICD-10-CM

## 2020-12-18 DIAGNOSIS — J690 Pneumonitis due to inhalation of food and vomit: Secondary | ICD-10-CM | POA: Diagnosis not present

## 2020-12-18 DIAGNOSIS — C7801 Secondary malignant neoplasm of right lung: Secondary | ICD-10-CM | POA: Diagnosis present

## 2020-12-18 DIAGNOSIS — C7931 Secondary malignant neoplasm of brain: Secondary | ICD-10-CM | POA: Diagnosis present

## 2020-12-18 DIAGNOSIS — M25422 Effusion, left elbow: Secondary | ICD-10-CM | POA: Diagnosis not present

## 2020-12-18 DIAGNOSIS — N1832 Chronic kidney disease, stage 3b: Secondary | ICD-10-CM | POA: Diagnosis present

## 2020-12-18 DIAGNOSIS — R338 Other retention of urine: Secondary | ICD-10-CM | POA: Diagnosis not present

## 2020-12-18 DIAGNOSIS — F419 Anxiety disorder, unspecified: Secondary | ICD-10-CM | POA: Diagnosis present

## 2020-12-18 DIAGNOSIS — Z20822 Contact with and (suspected) exposure to covid-19: Secondary | ICD-10-CM | POA: Diagnosis present

## 2020-12-18 DIAGNOSIS — Z8673 Personal history of transient ischemic attack (TIA), and cerebral infarction without residual deficits: Secondary | ICD-10-CM | POA: Diagnosis not present

## 2020-12-18 DIAGNOSIS — I442 Atrioventricular block, complete: Secondary | ICD-10-CM | POA: Diagnosis present

## 2020-12-18 DIAGNOSIS — C7951 Secondary malignant neoplasm of bone: Secondary | ICD-10-CM | POA: Diagnosis present

## 2020-12-18 DIAGNOSIS — R7989 Other specified abnormal findings of blood chemistry: Secondary | ICD-10-CM | POA: Diagnosis present

## 2020-12-18 DIAGNOSIS — R4701 Aphasia: Secondary | ICD-10-CM | POA: Diagnosis present

## 2020-12-18 DIAGNOSIS — E860 Dehydration: Secondary | ICD-10-CM | POA: Diagnosis not present

## 2020-12-18 DIAGNOSIS — I700 Atherosclerosis of aorta: Secondary | ICD-10-CM | POA: Diagnosis not present

## 2020-12-18 DIAGNOSIS — E785 Hyperlipidemia, unspecified: Secondary | ICD-10-CM

## 2020-12-18 DIAGNOSIS — M1A9XX0 Chronic gout, unspecified, without tophus (tophi): Secondary | ICD-10-CM | POA: Diagnosis present

## 2020-12-18 DIAGNOSIS — C7800 Secondary malignant neoplasm of unspecified lung: Secondary | ICD-10-CM | POA: Diagnosis present

## 2020-12-18 DIAGNOSIS — E7800 Pure hypercholesterolemia, unspecified: Secondary | ICD-10-CM | POA: Diagnosis present

## 2020-12-18 DIAGNOSIS — R509 Fever, unspecified: Secondary | ICD-10-CM

## 2020-12-18 DIAGNOSIS — J9811 Atelectasis: Secondary | ICD-10-CM | POA: Diagnosis not present

## 2020-12-18 DIAGNOSIS — J989 Respiratory disorder, unspecified: Secondary | ICD-10-CM | POA: Diagnosis not present

## 2020-12-18 DIAGNOSIS — Z950 Presence of cardiac pacemaker: Secondary | ICD-10-CM | POA: Diagnosis not present

## 2020-12-18 DIAGNOSIS — Z951 Presence of aortocoronary bypass graft: Secondary | ICD-10-CM

## 2020-12-18 DIAGNOSIS — T508X5A Adverse effect of diagnostic agents, initial encounter: Secondary | ICD-10-CM | POA: Diagnosis not present

## 2020-12-18 DIAGNOSIS — G9389 Other specified disorders of brain: Secondary | ICD-10-CM | POA: Diagnosis present

## 2020-12-18 DIAGNOSIS — Z7982 Long term (current) use of aspirin: Secondary | ICD-10-CM

## 2020-12-18 DIAGNOSIS — R0600 Dyspnea, unspecified: Secondary | ICD-10-CM

## 2020-12-18 DIAGNOSIS — I10 Essential (primary) hypertension: Secondary | ICD-10-CM | POA: Diagnosis present

## 2020-12-18 DIAGNOSIS — G928 Other toxic encephalopathy: Secondary | ICD-10-CM | POA: Diagnosis not present

## 2020-12-18 DIAGNOSIS — M7989 Other specified soft tissue disorders: Secondary | ICD-10-CM | POA: Diagnosis not present

## 2020-12-18 DIAGNOSIS — M419 Scoliosis, unspecified: Secondary | ICD-10-CM | POA: Diagnosis not present

## 2020-12-18 DIAGNOSIS — Z4682 Encounter for fitting and adjustment of non-vascular catheter: Secondary | ICD-10-CM | POA: Diagnosis not present

## 2020-12-18 DIAGNOSIS — I6389 Other cerebral infarction: Secondary | ICD-10-CM | POA: Diagnosis not present

## 2020-12-18 DIAGNOSIS — R410 Disorientation, unspecified: Secondary | ICD-10-CM | POA: Diagnosis not present

## 2020-12-18 DIAGNOSIS — I69151 Hemiplegia and hemiparesis following nontraumatic intracerebral hemorrhage affecting right dominant side: Secondary | ICD-10-CM

## 2020-12-18 DIAGNOSIS — Z888 Allergy status to other drugs, medicaments and biological substances status: Secondary | ICD-10-CM

## 2020-12-18 DIAGNOSIS — Z9989 Dependence on other enabling machines and devices: Secondary | ICD-10-CM | POA: Diagnosis not present

## 2020-12-18 DIAGNOSIS — R569 Unspecified convulsions: Secondary | ICD-10-CM | POA: Diagnosis not present

## 2020-12-18 DIAGNOSIS — R3129 Other microscopic hematuria: Secondary | ICD-10-CM | POA: Diagnosis present

## 2020-12-18 DIAGNOSIS — N281 Cyst of kidney, acquired: Secondary | ICD-10-CM | POA: Diagnosis not present

## 2020-12-18 DIAGNOSIS — R609 Edema, unspecified: Secondary | ICD-10-CM

## 2020-12-18 DIAGNOSIS — Z4659 Encounter for fitting and adjustment of other gastrointestinal appliance and device: Secondary | ICD-10-CM

## 2020-12-18 DIAGNOSIS — K429 Umbilical hernia without obstruction or gangrene: Secondary | ICD-10-CM | POA: Diagnosis not present

## 2020-12-18 DIAGNOSIS — Z6830 Body mass index (BMI) 30.0-30.9, adult: Secondary | ICD-10-CM

## 2020-12-18 DIAGNOSIS — N141 Nephropathy induced by other drugs, medicaments and biological substances: Secondary | ICD-10-CM | POA: Diagnosis not present

## 2020-12-18 DIAGNOSIS — Z79890 Hormone replacement therapy (postmenopausal): Secondary | ICD-10-CM

## 2020-12-18 DIAGNOSIS — I6501 Occlusion and stenosis of right vertebral artery: Secondary | ICD-10-CM | POA: Diagnosis not present

## 2020-12-18 DIAGNOSIS — I63441 Cerebral infarction due to embolism of right cerebellar artery: Secondary | ICD-10-CM | POA: Diagnosis not present

## 2020-12-18 DIAGNOSIS — M7032 Other bursitis of elbow, left elbow: Secondary | ICD-10-CM | POA: Diagnosis present

## 2020-12-18 DIAGNOSIS — F05 Delirium due to known physiological condition: Secondary | ICD-10-CM | POA: Diagnosis present

## 2020-12-18 DIAGNOSIS — M25522 Pain in left elbow: Secondary | ICD-10-CM | POA: Diagnosis not present

## 2020-12-18 DIAGNOSIS — R401 Stupor: Secondary | ICD-10-CM | POA: Diagnosis not present

## 2020-12-18 DIAGNOSIS — Z923 Personal history of irradiation: Secondary | ICD-10-CM

## 2020-12-18 DIAGNOSIS — R0602 Shortness of breath: Secondary | ICD-10-CM | POA: Diagnosis not present

## 2020-12-18 DIAGNOSIS — M19042 Primary osteoarthritis, left hand: Secondary | ICD-10-CM | POA: Diagnosis present

## 2020-12-18 DIAGNOSIS — K219 Gastro-esophageal reflux disease without esophagitis: Secondary | ICD-10-CM | POA: Diagnosis present

## 2020-12-18 DIAGNOSIS — I63341 Cerebral infarction due to thrombosis of right cerebellar artery: Secondary | ICD-10-CM | POA: Diagnosis not present

## 2020-12-18 DIAGNOSIS — R739 Hyperglycemia, unspecified: Secondary | ICD-10-CM | POA: Diagnosis present

## 2020-12-18 DIAGNOSIS — E039 Hypothyroidism, unspecified: Secondary | ICD-10-CM | POA: Diagnosis present

## 2020-12-18 DIAGNOSIS — T426X5A Adverse effect of other antiepileptic and sedative-hypnotic drugs, initial encounter: Secondary | ICD-10-CM | POA: Diagnosis not present

## 2020-12-18 DIAGNOSIS — G4733 Obstructive sleep apnea (adult) (pediatric): Secondary | ICD-10-CM | POA: Diagnosis present

## 2020-12-18 DIAGNOSIS — B957 Other staphylococcus as the cause of diseases classified elsewhere: Secondary | ICD-10-CM | POA: Diagnosis not present

## 2020-12-18 DIAGNOSIS — I771 Stricture of artery: Secondary | ICD-10-CM | POA: Diagnosis not present

## 2020-12-18 DIAGNOSIS — Z85841 Personal history of malignant neoplasm of brain: Secondary | ICD-10-CM

## 2020-12-18 DIAGNOSIS — Z0389 Encounter for observation for other suspected diseases and conditions ruled out: Secondary | ICD-10-CM | POA: Diagnosis not present

## 2020-12-18 DIAGNOSIS — C9241 Acute promyelocytic leukemia, in remission: Secondary | ICD-10-CM | POA: Diagnosis present

## 2020-12-18 DIAGNOSIS — G9340 Encephalopathy, unspecified: Secondary | ICD-10-CM | POA: Diagnosis not present

## 2020-12-18 DIAGNOSIS — E440 Moderate protein-calorie malnutrition: Secondary | ICD-10-CM | POA: Diagnosis present

## 2020-12-18 DIAGNOSIS — D696 Thrombocytopenia, unspecified: Secondary | ICD-10-CM | POA: Diagnosis not present

## 2020-12-18 DIAGNOSIS — Z85820 Personal history of malignant melanoma of skin: Secondary | ICD-10-CM

## 2020-12-18 DIAGNOSIS — I619 Nontraumatic intracerebral hemorrhage, unspecified: Secondary | ICD-10-CM | POA: Diagnosis present

## 2020-12-18 DIAGNOSIS — Z515 Encounter for palliative care: Secondary | ICD-10-CM

## 2020-12-18 DIAGNOSIS — Z79899 Other long term (current) drug therapy: Secondary | ICD-10-CM

## 2020-12-18 DIAGNOSIS — E46 Unspecified protein-calorie malnutrition: Secondary | ICD-10-CM | POA: Diagnosis present

## 2020-12-18 DIAGNOSIS — R54 Age-related physical debility: Secondary | ICD-10-CM | POA: Diagnosis present

## 2020-12-18 DIAGNOSIS — Z66 Do not resuscitate: Secondary | ICD-10-CM | POA: Diagnosis present

## 2020-12-18 DIAGNOSIS — I2510 Atherosclerotic heart disease of native coronary artery without angina pectoris: Secondary | ICD-10-CM | POA: Diagnosis present

## 2020-12-18 DIAGNOSIS — Z801 Family history of malignant neoplasm of trachea, bronchus and lung: Secondary | ICD-10-CM

## 2020-12-18 DIAGNOSIS — N179 Acute kidney failure, unspecified: Secondary | ICD-10-CM | POA: Diagnosis present

## 2020-12-18 DIAGNOSIS — Z8249 Family history of ischemic heart disease and other diseases of the circulatory system: Secondary | ICD-10-CM

## 2020-12-18 DIAGNOSIS — N401 Enlarged prostate with lower urinary tract symptoms: Secondary | ICD-10-CM | POA: Diagnosis not present

## 2020-12-18 DIAGNOSIS — M19041 Primary osteoarthritis, right hand: Secondary | ICD-10-CM | POA: Diagnosis present

## 2020-12-18 DIAGNOSIS — C801 Malignant (primary) neoplasm, unspecified: Secondary | ICD-10-CM | POA: Diagnosis not present

## 2020-12-18 DIAGNOSIS — N19 Unspecified kidney failure: Secondary | ICD-10-CM | POA: Diagnosis not present

## 2020-12-18 DIAGNOSIS — R7881 Bacteremia: Secondary | ICD-10-CM | POA: Diagnosis not present

## 2020-12-18 DIAGNOSIS — Z96652 Presence of left artificial knee joint: Secondary | ICD-10-CM | POA: Diagnosis present

## 2020-12-18 DIAGNOSIS — M2540 Effusion, unspecified joint: Secondary | ICD-10-CM

## 2020-12-18 DIAGNOSIS — G40211 Localization-related (focal) (partial) symptomatic epilepsy and epileptic syndromes with complex partial seizures, intractable, with status epilepticus: Principal | ICD-10-CM | POA: Diagnosis present

## 2020-12-18 DIAGNOSIS — E875 Hyperkalemia: Secondary | ICD-10-CM | POA: Diagnosis not present

## 2020-12-18 DIAGNOSIS — M47816 Spondylosis without myelopathy or radiculopathy, lumbar region: Secondary | ICD-10-CM | POA: Diagnosis not present

## 2020-12-18 DIAGNOSIS — I129 Hypertensive chronic kidney disease with stage 1 through stage 4 chronic kidney disease, or unspecified chronic kidney disease: Secondary | ICD-10-CM | POA: Diagnosis present

## 2020-12-18 DIAGNOSIS — I252 Old myocardial infarction: Secondary | ICD-10-CM

## 2020-12-18 DIAGNOSIS — Z8261 Family history of arthritis: Secondary | ICD-10-CM

## 2020-12-18 DIAGNOSIS — E870 Hyperosmolality and hypernatremia: Secondary | ICD-10-CM | POA: Diagnosis not present

## 2020-12-18 DIAGNOSIS — R4182 Altered mental status, unspecified: Secondary | ICD-10-CM | POA: Diagnosis not present

## 2020-12-18 LAB — VALPROIC ACID LEVEL: Valproic Acid Lvl: 69 ug/mL (ref 50.0–100.0)

## 2020-12-18 MED ORDER — SERTRALINE HCL 50 MG PO TABS
50.0000 mg | ORAL_TABLET | Freq: Every day | ORAL | Status: DC
  Administered 2020-12-18 – 2020-12-23 (×6): 50 mg via ORAL
  Filled 2020-12-18 (×6): qty 1

## 2020-12-18 MED ORDER — CLOBAZAM 10 MG PO TABS
10.0000 mg | ORAL_TABLET | Freq: Every day | ORAL | Status: DC
  Administered 2020-12-18: 10 mg via ORAL
  Filled 2020-12-18: qty 1

## 2020-12-18 MED ORDER — SODIUM CHLORIDE 0.9 % IV SOLN
750.0000 mg | Freq: Two times a day (BID) | INTRAVENOUS | Status: DC
  Administered 2020-12-18 – 2020-12-24 (×12): 750 mg via INTRAVENOUS
  Filled 2020-12-18 (×14): qty 7.5

## 2020-12-18 MED ORDER — DIVALPROEX SODIUM 250 MG PO DR TAB
500.0000 mg | DELAYED_RELEASE_TABLET | Freq: Three times a day (TID) | ORAL | Status: DC
  Administered 2020-12-18 – 2020-12-21 (×8): 500 mg via ORAL
  Filled 2020-12-18 (×8): qty 2

## 2020-12-18 MED ORDER — ALLOPURINOL 100 MG PO TABS
200.0000 mg | ORAL_TABLET | Freq: Every day | ORAL | Status: DC
  Administered 2020-12-18 – 2020-12-23 (×6): 200 mg via ORAL
  Filled 2020-12-18 (×6): qty 2

## 2020-12-18 MED ORDER — TRAZODONE HCL 50 MG PO TABS: 25.0000 mg | ORAL_TABLET | Freq: Every day | ORAL | Status: DC

## 2020-12-18 MED ORDER — ASPIRIN EC 81 MG PO TBEC
81.0000 mg | DELAYED_RELEASE_TABLET | Freq: Every evening | ORAL | Status: DC
  Administered 2020-12-18 – 2020-12-23 (×6): 81 mg via ORAL
  Filled 2020-12-18 (×6): qty 1

## 2020-12-18 MED ORDER — ONDANSETRON HCL 4 MG PO TABS: 4.0000 mg | ORAL_TABLET | Freq: Four times a day (QID) | ORAL | Status: DC | PRN

## 2020-12-18 MED ORDER — DIVALPROEX SODIUM 250 MG PO DR TAB
500.0000 mg | DELAYED_RELEASE_TABLET | Freq: Three times a day (TID) | ORAL | Status: DC
  Administered 2020-12-18: 500 mg via ORAL
  Filled 2020-12-18: qty 2

## 2020-12-18 MED ORDER — ACETAMINOPHEN 325 MG PO TABS
650.0000 mg | ORAL_TABLET | Freq: Four times a day (QID) | ORAL | Status: DC | PRN
  Administered 2020-12-21: 650 mg via ORAL
  Filled 2020-12-18: qty 2

## 2020-12-18 MED ORDER — ONDANSETRON HCL 4 MG/2ML IJ SOLN: 4.0000 mg | Freq: Four times a day (QID) | INTRAMUSCULAR | Status: DC | PRN

## 2020-12-18 MED ORDER — MECLIZINE HCL 12.5 MG PO TABS: 12.5000 mg | ORAL_TABLET | Freq: Three times a day (TID) | ORAL | Status: DC | PRN

## 2020-12-18 MED ORDER — COLCHICINE 0.6 MG PO TABS: 0.6000 mg | ORAL_TABLET | Freq: Every day | ORAL | Status: DC | PRN

## 2020-12-18 MED ORDER — DIVALPROEX SODIUM 250 MG PO DR TAB
750.0000 mg | DELAYED_RELEASE_TABLET | Freq: Three times a day (TID) | ORAL | Status: DC
  Administered 2020-12-18: 750 mg via ORAL
  Filled 2020-12-18: qty 3

## 2020-12-18 MED ORDER — METOPROLOL SUCCINATE ER 50 MG PO TB24
75.0000 mg | ORAL_TABLET | Freq: Every day | ORAL | Status: DC
  Administered 2020-12-18 – 2020-12-23 (×6): 75 mg via ORAL
  Filled 2020-12-18 (×6): qty 1

## 2020-12-18 MED ORDER — AMLODIPINE BESYLATE 2.5 MG PO TABS
2.5000 mg | ORAL_TABLET | Freq: Every day | ORAL | Status: DC
  Administered 2020-12-18 – 2020-12-21 (×4): 2.5 mg via ORAL
  Filled 2020-12-18 (×4): qty 1

## 2020-12-18 MED ORDER — LEVOTHYROXINE SODIUM 25 MCG PO TABS
137.0000 ug | ORAL_TABLET | Freq: Every day | ORAL | Status: DC
  Administered 2020-12-18 – 2020-12-21 (×4): 137 ug via ORAL
  Filled 2020-12-18 (×4): qty 1

## 2020-12-18 MED ORDER — ACETAMINOPHEN 650 MG RE SUPP
650.0000 mg | Freq: Four times a day (QID) | RECTAL | Status: DC | PRN
  Administered 2020-12-21: 650 mg via RECTAL
  Filled 2020-12-18: qty 1

## 2020-12-18 NOTE — Plan of Care (Signed)
Pt is alert oriented x 4. Ambulatory to bathroom with cane, with +1 assist. Pt has heart monitor on and SCDs on.   Problem: Education: Goal: Knowledge of General Education information will improve Description: Including pain rating scale, medication(s)/side effects and non-pharmacologic comfort measures Outcome: Progressing   Problem: Health Behavior/Discharge Planning: Goal: Ability to manage health-related needs will improve Outcome: Progressing   Problem: Clinical Measurements: Goal: Ability to maintain clinical measurements within normal limits will improve Outcome: Progressing Goal: Will remain free from infection Outcome: Progressing Goal: Diagnostic test results will improve Outcome: Progressing Goal: Respiratory complications will improve Outcome: Progressing Goal: Cardiovascular complication will be avoided Outcome: Progressing   Problem: Activity: Goal: Risk for activity intolerance will decrease Outcome: Progressing   Problem: Nutrition: Goal: Adequate nutrition will be maintained Outcome: Progressing   Problem: Coping: Goal: Level of anxiety will decrease Outcome: Progressing   Problem: Elimination: Goal: Will not experience complications related to bowel motility Outcome: Progressing Goal: Will not experience complications related to urinary retention Outcome: Progressing   Problem: Pain Managment: Goal: General experience of comfort will improve Outcome: Progressing   Problem: Safety: Goal: Ability to remain free from injury will improve Outcome: Progressing   Problem: Skin Integrity: Goal: Risk for impaired skin integrity will decrease Outcome: Progressing

## 2020-12-18 NOTE — Progress Notes (Addendum)
Got patient up to [facility], pt had BM, got patient back to sitting on edge of bed, gave pt some hand sanitizer and at approximately 1628 witnessed seizure-like episode, Pressed Red button on EEG. Pt unable to speak, unable to follow commands, staring downward but nodded appropriately to questions with some delayed responsiveness, patient mouth hung open, drooling and endorsed having metallic taste in his mouth. Patient denied pain. After approximately 5 min was able to lay back down in bed and answer questions, but with some difficulty and speech delay. Attending MD notified and Neurology paged. No new orders at this time, Scheduled clobazam administered as ordered

## 2020-12-18 NOTE — H&P (Signed)
History and Physical    David [name]. GDJ:[id number] DOB: 1940/09/24 DOA: 01/07/2021  PCP: [name], MD   Patient coming from: [facility].  I have personally briefly reviewed patient's old medical records in [place]  Chief Complaint: Abnormal EEGs.  HPI: David [name]. is a 80 y.o. male with medical history significant of anginal pain osteoarthritis, benign familiar f hematuria, BPH, CAD, NSTEMI, GERD, hyperlipidemia, hypothyroidism, unspecified mood disorder, pacemaker placement, history of rectal bleeding, sleep apnea on CPAP, steroid-induced hyperglycemia, CML, melanoma metastatic to lung and brain, hemorrhagic CVA with aphasia in 2017 who was admitted on 12/15/2020 to [facility] due to aphasia.  Please see HPI of [place] history and physical along with discharge summary for further detail.  He is being sent to this facility for LTM EEG due to multiple EEG showing focal seizures from the left hemisphere despite the patient being medicated with Keppra and valproic acid.  He is unable to elaborate at the moment, but denied headache, chest pain back or abdominal pain at this time.  Review of Systems: As per HPI otherwise all other systems reviewed and are negative.  Past Medical History:  Diagnosis Date   Anginal pain ([place])    Arthritis    mostly hands   Benign familial hematuria    BPH (benign prostatic hypertrophy)    Brain cancer (HCC)    melanoma with brain met   Coronary artery disease    GERD (gastroesophageal reflux disease)    High cholesterol    Hypertension    Hypothyroidism    Intracerebral hemorrhage (HCC)    Melanoma ([place])    Metastatic melanoma to lung (HCC)    Mood disorder ([place])    Myocardial infarction [facility])    Pacemaker    due to syncope, 3rd degree HB (upgrade to [employer] CRT-P 02/25/13 (Dr. Uvaldo Rising)   Rectal bleed    due to NSAIDS   Renal disorder    Sepsis ([place]) 12/09/2018   Skin cancer    melanoma   Sleep apnea    uses CPAP    Steroid-induced hyperglycemia    Stroke [facility])    Past Surgical History:  Procedure Laterality Date   APPLICATION OF CRANIAL NAVIGATION N/A 10/15/2015   Procedure: APPLICATION OF CRANIAL NAVIGATION;  Surgeon: Erline Levine, MD;  Location: [name] NEURO ORS;  Service: Neurosurgery;  Laterality: N/A;   BIV PACEMAKER GENERATOR CHANGEOUT N/A 04/02/2019   Procedure: BIV PACEMAKER GENERATOR CHANGEOUT;  Surgeon: Evans Lance, MD;  Location: [place] CV LAB;  Service: Cardiovascular;  Laterality: N/A;   CARDIAC CATHETERIZATION  2013   CARDIAC SURGERY     bypass X 2   COLONOSCOPY WITH PROPOFOL N/A 05/08/2016   diverticulosis, int hem, no f/u needed [name])   CORONARY ARTERY BYPASS GRAFT  2013   LIMA-LAD, SVG-PDA 03/27/11 (Dr. Francee Gentile)   David N/A 10/15/2015   Procedure: LEFT FRONTAL CRANIOTOMY TUMOR EXCISION with Curve;  Surgeon: Erline Levine, MD;  Location: [place] NEURO ORS;  Service: Neurosurgery;  Laterality: N/A;  CRANIOTOMY TUMOR EXCISION   EYE SURGERY     FOOT SURGERY  08/2010   IR REMOVAL TUN ACCESS W/ PORT W/O FL MOD SED  08/06/2020   JOINT REPLACEMENT Left    partial knee   KNEE ARTHROSCOPY  12/2008   LYMPH NODE BIOPSY     PACEMAKER INSERTION     TONSILLECTOMY     Social History  reports that he quit smoking about 63 years ago. His smoking use  included cigarettes. He has a 3.00 pack-year smoking history. He has never used smokeless tobacco. He reports current alcohol use. He reports that he does not use drugs.  Allergies  Allergen Reactions   Ezetimibe Other (See Comments)    Unknown reaction   Simvastatin Other (See Comments)    Reaction:  Gave pt a fever  Fever - temp of 103.   In 2003    Family History  Problem Relation Age of Onset   Cancer Mother 53       lung    Hypertension Mother    Hypertension Father    Heart attack Father    Heart disease Father    Rheum arthritis Sister    Cancer Maternal Grandmother    Prior to Admission medications   Medication Sig Start Date  End Date Taking? Authorizing Provider  acetaminophen (TYLENOL) 325 MG tablet Take 2 tablets (650 mg total) by mouth every 6 (six) hours as needed for mild pain (or Fever >/= 101). 12/27/18   Angiulli, Lavon Paganini, PA-C  allopurinol (ZYLOPRIM) 100 MG tablet Take 200 mg by mouth daily. 12/02/19   [provider]  amLODipine (NORVASC) 2.5 MG tablet Take 1 tablet (2.5 mg total) by mouth daily. Do not take if dizzy and Systolic BP (top number) is less than 120. 02/27/20 10/05/20  Marrianne Mood D, PA-C  aspirin EC 81 MG tablet Take 81 mg by mouth every evening.     [provider]  Cholecalciferol (VITAMIN D3) 25 MCG (1000 UT) CAPS Take 1 capsule (1,000 Units total) by mouth daily. 12/27/18   Angiulli, Lavon Paganini, PA-C  colchicine 0.6 MG tablet Take 1 tablet (0.6 mg total) by mouth daily as needed (gout flare). May take 2 tablets on first day 05/31/20   [name], MD  divalproex (DEPAKOTE) 500 MG DR tablet Take 1 tablet (500 mg total) by mouth every 8 (eight) hours. 12/17/20   Shelly Coss, MD  Evolocumab (REPATHA SURECLICK) 102 MG/ML SOAJ Inject 1 pen into the skin every 14 (fourteen) days. 02/27/20   Marrianne Mood D, PA-C  furosemide (LASIX) 20 MG tablet Take 1 tablet (20 mg total) by mouth every other day. 05/31/20   [name], MD  levETIRAcetam 750 mg in sodium chloride 0.9 % 100 mL Inject 750 mg into the vein every 12 (twelve) hours. 12/16/20   Shelly Coss, MD  levothyroxine (SYNTHROID) 137 MCG tablet TAKE 1 TABLET BY MOUTH ONCE DAILY BEFORE BREAKFAST 12/27/18   Angiulli, Lavon Paganini, PA-C  meclizine (ANTIVERT) 12.5 MG tablet Take 1 tablet (12.5 mg total) by mouth 3 (three) times daily as needed for dizziness. 01/13/20   Lesleigh Noe, MD  metoprolol succinate (TOPROL-XL) 50 MG 24 hr tablet TAKE 1 AND 1/2 TABLET (75 MG) BY MOUTH EVERY DAY WITH OR IMMEDIATELY FOLLOWING A MEAL *DO NOT CRUSH* 12/27/18   Angiulli, Lavon Paganini, PA-C  sertraline (ZOLOFT) 50 MG tablet Take 1 tablet  (50 mg total) by mouth daily. Take with breakfast.  Initiate 66m dose after completing 5 days of 25m. 06/11/19   FiLloyd HugerMD  testosterone cypionate (DEPOTESTOSTERONE CYPIONATE) 200 MG/ML injection INJECT 0.5ML (100MG TOTAL) INTRAMUSCULARLY EVERY 14 DAYS (SINGLE DOSE VIAL) *ONLY GOOD FOR 1 DOSE* *CALL FOR REFILL* 10/13/20   GuRia BushMD  traZODone (DESYREL) 50 MG tablet Take 0.5-1 tablets (25-50 mg total) by mouth at bedtime. 10/14/18   GuRia BushMD  Turmeric 500 MG CAPS Take 2 capsules by mouth daily as needed (  joint pain). 05/31/20   [name], MD  vitamin B-12 1000 MCG tablet Take 1 tablet (1,000 mcg total) by mouth daily. 12/17/20   Shelly Coss, MD    Physical Exam: Vitals:   12/23/2020 0330  BP: (!) 126/91  Pulse: 75  Resp: 16  Temp: 97.6 F (36.4 C)  TempSrc: Oral  SpO2: 93%    Constitutional: NAD, calm, comfortable Eyes: PERRL, lids and conjunctivae normal ENMT: Mucous membranes are moist. Posterior pharynx clear of any exudate or lesions. Neck: normal, supple, no masses, no thyromegaly Respiratory: clear to auscultation bilaterally, no wheezing, no crackles. Normal respiratory effort. No accessory muscle use.  Cardiovascular: Regular rate and rhythm, no murmurs / rubs / gallops. No extremity edema. 2+ pedal pulses. No carotid bruits.  Abdomen: Obese, no distention.  Bowel sounds positive.  Soft, no tenderness, no masses palpated. No hepatosplenomegaly. Bowel sounds positive.  Musculoskeletal: Mild to moderate generalized weakness.  No clubbing / cyanosis. Good ROM, no contractures. Normal muscle tone.  Skin: no acute rashes, lesions, ulcers limited dermatological examination. Neurologic: CN 2-12 grossly intact. Sensation intact, DTR normal. Strength 5/5 in all 4.  Psychiatric: Normal judgment and insight. Alert and oriented x 3. Normal mood.   Labs on Admission: I have personally reviewed following labs and imaging studies  CBC: Recent Labs   Lab 12/14/20 1129 12/15/20 1206 12/16/20 0456 12/17/20 2048  WBC 5.1 4.9 5.3 5.7  NEUTROABS 3.6 3.7  --  4.2  HGB 15.6 16.3 15.5 15.6  HCT 47.2 46.5 45.2 45.7  MCV 95.5 92.1 93.6 94.8  PLT 169 168 173 235    Basic Metabolic Panel: Recent Labs  Lab 12/14/20 1129 12/15/20 1206 12/15/20 1543 12/16/20 0456 12/17/20 2048  NA 138 137  --  137 136  K 4.6 4.8  --  4.6 4.8  CL 103 103  --  103 103  CO2 24 24  --  23 27  GLUCOSE 126* 133*  --  88 87  BUN 48* 43*  --  43* 45*  CREATININE 2.29* 2.07* 2.02* 1.84* 1.85*  CALCIUM 9.0 9.3  --  9.1 9.1   GFR: Estimated Creatinine Clearance: 34.2 mL/min (A) (by C-G formula based on SCr of 1.85 mg/dL (H)).  Liver Function Tests: Recent Labs  Lab 12/14/20 1129 12/15/20 1206 12/17/20 2048  AST _0 ALT _1 ALKPHOS 54 51 48  BILITOT 0.6 0.7 0.6  PROT 7.3 6.9 6.5  ALBUMIN 4.2 4.3 3.7   Urine analysis:    Component Value Date/Time   COLORURINE YELLOW (A) 12/15/2020 1645   APPEARANCEUR CLEAR (A) 12/15/2020 1645   LABSPEC 1.020 12/15/2020 1645   PHURINE 6.5 12/15/2020 1645   GLUCOSEU NEGATIVE 12/15/2020 1645        HGBUR MODERATE (A) 12/15/2020 1645   BILIRUBINUR NEGATIVE 12/15/2020 1645   KETONESUR NEGATIVE 12/15/2020 1645   PROTEINUR 300 (A) 12/15/2020 1645        NITRITE NEGATIVE 12/15/2020 1645   LEUKOCYTESUR NEGATIVE 12/15/2020 1645   Radiological Exams on Admission: EEG adult  Result Date: 12/17/2020 Lora Havens, MD     12/17/2020  8:37 PM Patient Name: David [name]. MRN: [id number] Epilepsy Attending: Lora Havens Referring Physician/Provider: Dr Lesleigh Noe Duration: 12/17/2020 1641 to [place]  Patient history: 80 year old male with history of CVA, ICH, melanoma with mets to lung and brain who presented with aphasia.  EEG to evaluate for seizures.  Level of alertness: Awake, asleep  AEDs  during EEG study: LEV, VPA  Technical aspects: This EEG study was done with scalp electrodes positioned  according to the 10-20 International system of electrode placement. Electrical activity was acquired at a sampling rate of _0  and reviewed with a high frequency filter of _1  and a low frequency filter of _2 . EEG data were recorded continuously and digitally stored.  Description: The posterior dominant rhythm consists of 8 Hz activity of moderate voltage (25-35 uV) seen predominantly in posterior head regions, symmetric and reactive to eye opening and eye closing. Sleep was characterized by vertex waves, sleep spindles (12-_3 ), maximal fronto-central region. EEG also showed continuous 3 to 5 Hz sharply contoured theta-delta slowing with overriding 15 to 18 Hz beta activity in left frontal region consistent with prior craniotomy.  Seizure was noted arising from left centro-temporal region without clinical signs were noted. Onset of seizure on 12/17/2020 at 1742, ended at 1753. Concomitant EEG initially showed sharply contoured 13 to 14 Hz beta activity in left central temporal region which gradually involved all of left hemisphere followed by right hemisphere and evolved in morphology to 3 to 5 Hz theta- delta slowing.  Physiologic photic driving was not seen during photic stimulation.  Hyperventilation and photic stimulation were not performed.    ABNORMALITY -Focal seizure, left centro- temporal region -Breach artifact, left frontal region  IMPRESSION: This study showed one seizure without clinical signs arising from left central temporal region on 12/17/2020 at 1742, ended at 1753. EEG also showed evidence of cortical dysfunction in left frontal region consistent with prior craniotomy.   Dr Curly [place] was notified  Lora Havens     EKG: Independently reviewed.   Assessment/Plan Principal Problem:   Focal seizures ([place]) Admit to telemetry/inpatient. Continue Keppra 750 mg p.o. twice daily. Continue Depakote 500 mg p.o. every 8 hours. Beginning LTM EEG once technologist available. Benzodiazepines for  seizure activity. Neurology consult appreciated.  Active Problems:   Benign essential HTN Continue amlodipine 2.5 mg p.o. daily. Continue metoprolol 75 mg p.o. daily.    OSA on CPAP Continue CPAP at bedtime.    Hemiplegia and hemiparesis following nontraumatic ICH   Affecting right dominant side ([place]) Continue use of assistive devices. Supportive care as needed. Consider PT evaluation.    Metastatic melanoma to lung and brain [facility]) Status post left frontal craniotomy for metastatic melanoma resection. Follow-up with oncology as an outpatient as scheduled.    Acquired hypothyroidism Continue levothyroxine 137 mcg p.o. daily.    CKD stage 3a Monitor GFR and electrolytes.    Hyperlipidemia Continue rosuvastatin.   DVT prophylaxis: Lovenox SQ.  Code Status:   DNR.  Family Communication:   Disposition Plan:   Patient is from:             [facility].   Anticipated DC to:  Home.   Anticipated DC date:  12/21/2020.  Anticipated DC barriers: Clinical status.  Pending work-up.  Consults called:  Neurology.  Admission status:  Inpatient/telemetry.   Severity of Illness:   Reubin Milan MD [facility]  How to contact the [facility] Attending or Consulting provider [place] or covering provider during after hours [place], for this patient?   Check the care team in [facility] and look for a) attending/consulting [facility] provider listed and b) the [facility] team listed Log into [email or web address] and use [place]'s universal password to access. If you do not have the password, please contact the hospital operator. Locate the [facility] provider you are looking for under [facility] and page to a number that you can be directly reached. If you still have difficulty reaching the provider, please page the [facility] (Director on Call) for the Hospitalists listed on amion for assistance.  12/29/2020, 6:46 AM   This document was prepared using Dragon voice recognition software and

## 2020-12-18 NOTE — Progress Notes (Signed)
Neurology Progress Note  Subjective: Patient with persistent episodes of aphasia occurring approximately once an hour Patient with speech arrest episode lasting approximately 2 minutes during AM examination, not yet connected to LTM EEG  Objective: Current vital signs: BP 118/83 (BP Location: Right Arm)   Pulse 75   Temp 98.2 F (36.8 C) (Oral)   Resp 16   SpO2 96%  Vital signs in last 24 hours: Temp:  [97.6 F (36.4 C)-98.3 F (36.8 C)] 98.2 F (36.8 C) (09/10 0803) Pulse Rate:  [74-77] 75 (09/10 0803) Resp:  [16-18] 16 (09/10 0803) BP: (114-135)/(81-91) 118/83 (09/10 0803) SpO2:  [93 %-99 %] 96 % (09/10 0803)  Intake/Output from previous day: No intake/output data recorded. Intake/Output this shift: No intake/output data recorded. Nutritional status:  Diet Order             Diet Heart Room service appropriate? Yes; Fluid consistency: Thin  Diet effective now                  Physical Exam  Constitutional: Awake, alert, laying in hospital bed, in no acute distress.  Respiratory: Effort normal, non-labored breathing on room air.  GI: Soft.  No distension. There is no tenderness.   Neurologic Exam: Mental Status: Patient is awake, alert, oriented to person, place, and situation. Some limited concentration is noted.  Patient is able to give a clear and coherent history of present illness. He is able to name objects and repeat phrases.  He does have one episode of aphasia lasting approximately 2 minutes during assessment where he is able to state "yes", follow commands, and briefly explain that "it is happening".  On follow up attending exam, the patient was initially with normal speech output, then started to have a spell in which his speech became halting, he seemed to have decreased situational awareness and eye contact was lost; he also was mildly confused; this spell lasted for about 2 minutes. No automatisms, jerking or twitching noted. No definite EEG correlate  seen on LTM at the time of the spell.  Cranial Nerves: II: Visual Fields are full. Pupils are equal, round, and reactive to light.   III,IV, VI: EOMI without ptosis or diplopia with saccadic pursuits.  V: Facial sensation is symmetric to temperature VII: Facial movement is symmetric resting and smiling VIII: Hearing is intact to voice X: Palate elevates symmetrically XI: Shoulder shrug is symmetric. XII: Tongue protrudes midline without atrophy or fasciculations.  Motor: He is able to elevate bilateral upper and lower extremities antigravity without vertical drift.  5/5 strength in bilateral upper extremities with at least 4+/5 in bilateral lower extremities without noted asymmetry.  Grip strength 4+/5 bilaterally.  Tone is normal. Bulk is normal.  Sensory: Sensation is symmetric to light touch and temperature in the arms and legs. Deep Tendon Reflexes: 2+ and symmetric in the biceps, 1+ patellae Plantars: Toes are downgoing bilaterally.  Cerebellar: FNF and HKS are intact bilaterally without dysmetria Gait: Deferred  Lab Results: Results for orders placed or performed during the hospital encounter of 12/15/20 (from the past 48 hour(s))  Valproic acid level     Status: Abnormal   Collection Time: 12/17/20  8:48 PM  Result Value Ref Range   Valproic Acid Lvl 47 (L) 50.0 - 100.0 ug/mL    Comment: Performed at [facility], [address], [place], [place] [address]  Ammonia     Status: None   Collection Time: 12/17/20  8:48 PM  Result Value Ref  Range   Ammonia 18 9 - 35 umol/L    Comment: Performed at [facility], [address] [place], [place] [address]  CBC with Differential/Platelet     Status: None   Collection Time: 12/17/20  8:48 PM  Result Value Ref Range   WBC 5.7 4.0 - 10.5 K/uL   RBC 4.82 4.22 - 5.81 MIL/uL   Hemoglobin 15.6 13.0 - 17.0 g/dL   HCT 45.7 39.0 - 52.0 %   MCV 94.8 80.0 - 100.0 fL   MCH 32.4 26.0 - 34.0 pg   MCHC 34.1 30.0 -  36.0 g/dL   RDW 13.9 11.5 - 15.5 %   Platelets 165 150 - 400 K/uL   nRBC 0.0 0.0 - 0.2 %   Neutrophils Relative % 72 %   Neutro Abs 4.2 1.7 - 7.7 K/uL   Lymphocytes Relative 11 %   Lymphs Abs 0.7 0.7 - 4.0 K/uL   Monocytes Relative 9 %   Monocytes Absolute 0.5 0.1 - 1.0 K/uL   Eosinophils Relative 6 %   Eosinophils Absolute 0.3 0.0 - 0.5 K/uL   Basophils Relative 1 %   Basophils Absolute 0.0 0.0 - 0.1 K/uL   Immature Granulocytes 1 %   Abs Immature Granulocytes 0.03 0.00 - 0.07 K/uL    Comment: Performed at [facility], [address] [place], [place] [address]  Comprehensive metabolic panel     Status: Abnormal   Collection Time: 12/17/20  8:48 PM  Result Value Ref Range   Sodium 136 135 - 145 mmol/L   Potassium 4.8 3.5 - 5.1 mmol/L   Chloride 103 98 - 111 mmol/L   CO2 27 22 - 32 mmol/L   Glucose, Bld 87 70 - 99 mg/dL    Comment: Glucose reference range applies only to samples taken after fasting for at least 8 hours.   BUN 45 (H) 8 - 23 mg/dL   Creatinine, Ser 1.85 (H) 0.61 - 1.24 mg/dL   Calcium 9.1 8.9 - 10.3 mg/dL   Total Protein 6.5 6.5 - 8.1 g/dL   Albumin 3.7 3.5 - 5.0 g/dL   AST 17 15 - 41 U/L   ALT 12 0 - 44 U/L   Alkaline Phosphatase 48 38 - 126 U/L   Total Bilirubin 0.6 0.3 - 1.2 mg/dL   GFR, Estimated 36 (L) >60 mL/min    Comment: (NOTE) Calculated using the CKD-EPI Creatinine Equation (2021)    Anion gap 6 5 - 15    Comment: Performed at [facility], [address] [place], [place] [address]   *Note: Due to a large number of results and/or encounters for the requested time period, some results have not been displayed. A complete set of results can be found in Results Review.    Recent Results (from the past 240 hour(s))  Resp Panel by RT-PCR (Flu A&B, Covid) Nasopharyngeal Swab     Status: None   Collection Time: 12/15/20  1:03 PM   Specimen: Nasopharyngeal Swab; Nasopharyngeal(NP) swabs in vial transport medium  Result Value Ref  Range Status   SARS Coronavirus 2 by RT PCR NEGATIVE NEGATIVE Final    Comment: (NOTE) SARS-CoV-2 target nucleic acids are NOT DETECTED.  The SARS-CoV-2 RNA is generally detectable in upper respiratory specimens during the acute phase of infection. The lowest concentration of SARS-CoV-2 viral copies this assay can detect is 138 copies/mL. A negative result does not preclude SARS-Cov-2 infection and should not be used as the sole basis  for treatment or other patient management decisions. A negative result may occur with  improper specimen collection/handling, submission of specimen other than nasopharyngeal swab, presence of viral mutation(s) within the areas targeted by this assay, and inadequate number of viral copies(<138 copies/mL). A negative result must be combined with clinical observations, patient history, and epidemiological information. The expected result is Negative.  Fact Sheet for Patients:  EntrepreneurPulse.com.au  Fact Sheet for Healthcare Providers:  IncredibleEmployment.be  This test is no t yet approved or cleared by the [place] FDA and  has been authorized for detection and/or diagnosis of SARS-CoV-2 by FDA under an Emergency Use Authorization (EUA). This EUA will remain  in effect (meaning this test can be used) for the duration of the COVID-19 declaration under Section 564(b)(1) of the Act, 21 U.S.C.section 360bbb-3(b)(1), unless the authorization is terminated  or revoked sooner.       Influenza A by PCR NEGATIVE NEGATIVE Final   Influenza B by PCR NEGATIVE NEGATIVE Final    Comment: (NOTE) The Xpert Xpress SARS-CoV-2/FLU/RSV plus assay is intended as an aid in the diagnosis of influenza from Nasopharyngeal swab specimens and should not be used as a sole basis for treatment. Nasal washings and aspirates are unacceptable for Xpert Xpress SARS-CoV-2/FLU/RSV testing.  Fact Sheet for  Patients: EntrepreneurPulse.com.au  Fact Sheet for Healthcare Providers: IncredibleEmployment.be  This test is not yet approved or cleared by the [place] FDA and has been authorized for detection and/or diagnosis of SARS-CoV-2 by FDA under an Emergency Use Authorization (EUA). This EUA will remain in effect (meaning this test can be used) for the duration of the COVID-19 declaration under Section 564(b)(1) of the Act, 21 U.S.C. section 360bbb-3(b)(1), unless the authorization is terminated or revoked.  Performed at [facility], [address] [place], [place] [address]    Lipid Panel Recent Labs    12/16/20 0456  CHOL 138  TRIG 185*  HDL 43  CHOLHDL 3.2  VLDL 37  LDLCALC 58    Ref. Range 12/17/2020 20:48 12/22/2020 08:33  Valproic Acid,S Latest Ref Range: 50.0 - 100.0 ug/mL 47 (L) 69   Ammonia 12/17/2020: 18   Studies/Results: EEG adult  Result Date: 12/17/2020 Lora Havens, MD     12/17/2020  8:37 PM Patient Name: David [name]. MRN: [id number] Epilepsy Attending: Lora Havens Referring Physician/Provider: Dr Lesleigh Noe Duration: 12/17/2020 1641 to [place]  Patient history: 80 year old male with history of CVA, ICH, melanoma with mets to lung and brain who presented with aphasia.  EEG to evaluate for seizures.  Level of alertness: Awake, asleep  AEDs during EEG study: LEV, VPA  Technical aspects: This EEG study was done with scalp electrodes positioned according to the 10-20 International system of electrode placement. Electrical activity was acquired at a sampling rate of 500Hz  and reviewed with a high frequency filter of 70Hz  and a low frequency filter of 1Hz . EEG data were recorded continuously and digitally stored.  Description: The posterior dominant rhythm consists of 8 Hz activity of moderate voltage (25-35 uV) seen predominantly in posterior head regions, symmetric and reactive to eye opening and eye closing.  Sleep was characterized by vertex waves, sleep spindles (12-14Hz ), maximal fronto-central region. EEG also showed continuous 3 to 5 Hz sharply contoured theta-delta slowing with overriding 15 to 18 Hz beta activity in left frontal region consistent with prior craniotomy.  Seizure was noted arising from left centro-temporal region without clinical signs were noted. Onset of seizure on 12/17/2020 at  1742, ended at 1753. Concomitant EEG initially showed sharply contoured 13 to 14 Hz beta activity in left central temporal region which gradually involved all of left hemisphere followed by right hemisphere and evolved in morphology to 3 to 5 Hz theta- delta slowing.  Physiologic photic driving was not seen during photic stimulation.  Hyperventilation and photic stimulation were not performed.    ABNORMALITY -Focal seizure, left centro- temporal region -Breach artifact, left frontal region  IMPRESSION: This study showed one seizure without clinical signs arising from left central temporal region on 12/17/2020 at 1742, ended at 1753. EEG also showed evidence of cortical dysfunction in left frontal region consistent with prior craniotomy.   Dr Curly Shores was notified  Lora Havens     Medications: Scheduled:  allopurinol  200 mg Oral Daily   amLODipine  2.5 mg Oral Daily   aspirin EC  81 mg Oral QPM   divalproex  500 mg Oral Q8H   levothyroxine  137 mcg Oral Q0600   metoprolol succinate  75 mg Oral Daily   sertraline  50 mg Oral Daily   Continuous:  levETIRAcetam (KEPPRA) IVPB     I have reviewed the images obtained:   CT head on 12/15/2020-No acute intracranial process   Spot EEG on 12/15/2020-1 seizure from left central temporal region at 1629 hrs. on 12/15/2020 ongoing till the end of study at 1634 hrs.  During the seizure patient was initially not able to respond but that got confused and restless and wanted to get out of bed to urinate.  EEG also showed evidence of cortical dysfunction in the frontal region  consistent with prior craniotomy.  Spot EEG on 12/17/2020-focal seizure left central temporal region and breach artifact in the left frontal region.  The study showed 1 seizure without clinical signs arising from left central temporal region at 1742 hrs. on 12/17/2020 which ended at 1753.  EEG also showed evidence of cortical dysfunction in the left frontal region consistent with prior craniotomy.   Assessment:  80 year old male with PMHx of melanoma with metastasis to the left frontal lobe complicated by hemorrhage in 2017, bone metastasis, APML, CAD, CKD, depression, anxiety, hypertension, hyperlipidemia  who presented to [facility] with progressive aphasia and confusion for several days. EEG studies revealed seizures emanating from the left central temporal region for which he is being treated with Depakote and renally dosed Keppra. So far seizures have not been brought under full control. He was transferred to [facility] for long-term EEG monitoring and further management. - VPA level Friday night mildly subtherapeutic at 47, repeat VPA this morning therapeutic at 69.  - Overall presentation most consistent with breakthrough partial complex seizures in the setting of patient with known seizure disorder from brain metastasis of melanoma. Spot EEGs reveal focal seizures in the left central temporal region with cortical dysfunction in left frontal region consistent with prior craniotomy. LTM EEG ordered and pending- to be initiated for overnight monitoring. - EKG 9/7: AV dual-paced rhythm   Recommendations: - Increasing Depakote to 750 mg every 8 hours p.o. - AM VPA level therapeutic at 80 - May add Onfi if LTM EEG today shows frequent seizures - Continue Keppra 750 mg twice daily-renal dose - LTM EEG monitoring to be initiated  - Versed 2 mg or diazepam 5 mg IV for any seizure lasting more than 5 minutes. - MMA level pending-continue to replete B12 orally. - Further adjustments to antiepileptics based on LTM EEG  findings and clinical course.  Addendum: -  LTM EEG with recurrent seizure activity - Starting clobazam (Onfi) 10 mg po qd, first dose now.  - Valproic acid level is therapeutic at 69 mcg/mL - Since VPA level is now therapeutic, will decrease the scheduled dose back to 500 mg TID   LOS: 0 days   @Electronically  signed: Dr. Kerney Elbe 12/15/2020  8:09 AM

## 2020-12-18 NOTE — Consult Note (Signed)
Neurology Consultation  Reason for Consult: Aphasia, seizures Referring Physician: Dr. Curly Shores, neurology  CC: Aphasia  History is obtained from: Chart review  HPI: David [name]. is a 80 y.o. male past history of melanoma with metastasis to the left frontal lobe complicated by hemorrhage in 2017, left clavicular lymph node, bone metastasis, APML, CAD, CKD, depression, anxiety, hypertension, hyperlipidemia who has been seen over at [facility] for concerns of confusion and noted to have progressive problems with aphasia over the past few days prior to presentation to to the hospital and noted to have seizures emanating from left central temporal region for which he is being treated with Keppra, Depakote and discontinuing seizure threshold lowering medications such as trazodone on the list, transferred to [facility] for LTM EEG monitoring because of multiple EEGs showing focal seizures from the left hemisphere in spite of being on 2 antiepileptics.  Please see the HPI 12/15/20 documented in the initial consultation note from Dr. Curly Shores pasted below in italics: "HPI: David [name]. is a 80 y.o. right-handed male with past medical history significant for melanoma with metastasis to left frontal lobe complicated by hemorrhage in 2017, APML, hypertension, hypercholesterolemia, sleep apnea, CAD s/p CABG (2012), AV node dysfunction s/p pacemaker.    Per history largely provided by daughter with key details corroborated by patient, he has been in his normal state of health lately.  He was at a routine appointment for blood work for monitoring his CML on 9/6 when he had a brief episode of speech difficulty which quickly resolved.  His vitals and exam were felt to be stable and he returned home; he was reluctant to go to the ED for further evaluation at that time.  The caregiver did check on him via phone throughout the day and he reported he was doing just fine.   However the next morning when she arrived for her shift he reported he had had 3 additional episodes of speech difficulty and did want to be evaluated further.  He then had an episode she witnessed where he had difficulty letting go of his fork as well as speech arrest.   He had an intracerebral hemorrhage on 08/22/2015 with aphasia and there was concern for hypertensive bleed versus TIA with subsequent work-up demonstrating metastatic cancer and repeat head CT on 09/16/2015 showing a solitary melanoma metastasis in the left frontal region   Follows with Dr. Mickeal Skinner last seen 11/29/2020 at which time he was felt to be clinically and radiographically stable with chronic encephalopathy secondary to atrophy and leukomalacia secondary to brain surgery and radiation, plan for 107-monthfollow-up.  His examination at the time was notable for age advanced psychomotor slowing and limited insight at times but otherwise normal mental status, normal cranial nerves, mild right leg weakness 4+/5 flexors greater than extensors and subtle atrophy in the left proximal hip girdle musculature with a mildly hemiparetic gait."  [facility] been [facility] on Dep[facility]ote.  Was on Keppra on arrival to the hospital, dose increased and maximized with continuing focal seizure activity.  Subtle semiology of his seizures-speech arrest, confusion, disability responding with a prodrome of metallic taste and decreased sense of smell at times as documented by Dr. BCurly Shores  Most recent labs obtained 12/17/2020 at 8:48 PM with sodium 136, potassium 4.8, BUN 45, creatinine 1.85 with a GFR of 36.  CBC unremarkable, ammonia 18, valproate level 47.  Given additional 2 doses  p.o. of Depakote due to shortage of IV Depakote formulation.   ROS: Full ROS was performed and is negative except as noted in the HPI. Past Medical History:  Diagnosis Date   Anginal pain ([place])    Arthritis    mostly hands    Benign familial hematuria    BPH (benign prostatic hypertrophy)    Brain cancer (HCC)    melanoma with brain met   Coronary artery disease    GERD (gastroesophageal reflux disease)    High cholesterol    Hypertension    Hypothyroidism    Intracerebral hemorrhage (HCC)    Melanoma (HCC)    Metastatic melanoma to lung (HCC)    Mood disorder ([place])    Myocardial infarction [facility])    Pacemaker    due to syncope, 3rd degree HB (upgrade to [employer] CRT-P 02/25/13 (Dr. Uvaldo Rising)   Rectal bleed    due to NSAIDS   Renal disorder    Sepsis ([place]) 12/09/2018   Skin cancer    melanoma   Sleep apnea    uses CPAP   Steroid-induced hyperglycemia    Stroke ([place])         Family History  Problem Relation Age of Onset   Cancer Mother 36       lung    Hypertension Mother    Hypertension Father    Heart attack Father    Heart disease Father    Rheum arthritis Sister    Cancer Maternal Grandmother      Social History:   reports that he quit smoking about 63 years ago. His smoking use included cigarettes. He has a 3.00 pack-year smoking history. He has never used smokeless tobacco. He reports current alcohol use. He reports that he does not use drugs.  Medications No current facility-administered medications for this encounter.  Facility-Administered Medications Ordered in Other Encounters:    0.9 %  sodium chloride infusion, 1,000 mL, Intravenous, Once, [name], MD   Exam: Current vital signs: BP (!) 126/91 (BP Location: Right Arm)   Pulse 75   Temp 97.6 F (36.4 C) (Oral)   Resp 16   SpO2 93%  Vital signs in last 24 hours: Temp:  [97.6 F (36.4 C)-98.3 F (36.8 C)] 97.6 F (36.4 C) (09/10 0330) Pulse Rate:  [74-77] 75 (09/10 0330) Resp:  [16-18] 16 (09/10 0330) BP: (114-135)/(81-91) 126/91 (09/10 0330) SpO2:  [93 %-99 %] 93 % (09/10 0330) General: Awake alert in no distress HEENT: Normocephalic/atraumatic Cardiac: Regular rate rhythm Respiratory: Nonlabored breathing,  no wheezing Extremities warm well perfused Abdominal examination: Soft nontender nondistended abdomen Neurological exam Is awake alert oriented to the fact that in the hospital in [place], transferred from [place].  Able to name simple objects such as thumb, mass, glasses.  Able to repeat sentences.  Is able to tell me that there are times that he is not able to speak but at this point he is able to talk. He does have reduced attention concentration Cranial nerves: Pupils equal round reactive to light, extraocular movements appear grossly intact given the limitation of this exam being done at 4 in the morning, visual fields full, face appears grossly symmetric to me on my examination, tongue and palate midline. Motor examination with is able to raise both upper extremities without significant drift.  He is able to raise both lower extremities against gravity and against my hand giving me at least 4+/5 strength bilaterally. Sensation intact to light touch without extinction Coordination with  no gross dysmetria  Labs Most recent labs obtained 12/17/2020 at 8:48 PM with sodium 136, potassium 4.8, BUN 45, creatinine 1.85 with a GFR of 36.  CBC unremarkable, ammonia 18, valproate level 47. B12 383 B1 188  Imaging I have reviewed the images obtained:  CT-head- 12/15/2020-no acute intracranial process  MRI examination of the brain-unable to perform due to pacemaker  Spot EEG on 12/15/2020-1 seizure from left central temporal region at 1629 hrs. on 12/15/2020 ongoing till the end of study at 1634 hrs.  During the seizure patient was initially not able to respond but that got confused and restless and wanted to get out of bed to urinate.  EEG also showed evidence of cortical dysfunction in the frontal region consistent with prior craniotomy. Spot EEG on 12/17/2020-focal seizure left central temporal region and breach artifact in the left frontal region.  The study showed 1 seizure without clinical signs  arising from left central temporal region at 1742 hrs. on 12/17/2020 which ended at 1753.  EEG also showed evidence of cortical dysfunction in the left frontal region consistent with prior craniotomy.   Assessment:  80 year old with above past medical history with focal seizures with preserved consciousness-localization-related-not well controlled with Keppra and Depakote. Transferred over for long-term EEG monitoring and further management. Currently on Keppra 750 twice daily and Depakote 500 every 8 hours maintenance dose. VPA level tonight mildly subtherapeutic at 47. Breakthrough seizures in the setting of patient with known seizure disorder from brain metastasis of melanoma.  Recommendations: Continue Depakote 500 every 8 hours p.o. Continue Keppra 750 mg twice daily-renal dose Hookup to LTM EEG as soon as the technologist arrive Versed 2 mg or diazepam 5 mg IV for any seizure lasting more than 5 minutes. MMA level pending-continue to replete B12 orally. Further adjustments to antiepileptics based on LTM EEG findings and clinical course.   -- Amie [place], MD Neurologist [facility] Pager: [phone]

## 2020-12-18 NOTE — Progress Notes (Addendum)
Patient seen and examined at the bedside.  He reports intermittent speech abnormality.  He is alert and oriented at this time.  Vital signs are stable.  EEG technician was at the bedside during this encounter.  Plan for continuous EEG.  Continue current management.

## 2020-12-18 NOTE — Plan of Care (Signed)

## 2020-12-18 NOTE — Plan of Care (Addendum)
Updated Dr. Silas [place] regarding her father's clinical status and course.  We discussed that he has been transferred to [facility] for continuous EEG monitoring.  Discussed that his EEG last night demonstrated one 10 minutes seizure during the 2-hour period and I suspect that this represents a significant improvement from his seizure frequency on admission.  We discussed again that I do think it is likely he was having seizures for some time at home that were unrecognized due to how subtle they are clinically, and that is why it is taking multiple seizure medications and some time to control his seizures at this time.  We discussed that typically we try to avoid intubation for control of focal seizures and that her father is a DNR CODE STATUS, but that on occasion if we are finding the seizures continue to be very difficult to control a short time-limited period of intubation can be useful to achieve seizure control with the potential for very good long-term outcomes after extubation.  She will plan on discussing this with her father in case it becomes necessary, given that in between seizures he is returning to a reasonably communicative baseline and would be able to participate in goals of care discussions.  She asked me to estimate how much longer he may be in the hospital, and we discussed that that would be easier to estimate after determining his seizure frequency today on cEEG monitoring.  Dr. Cheral Marker was updated about my conversation and will be updating the patient/family from here.  Lesleigh Noe MD-PhD [facility] [phone]  [facility] coverage for [facility] is from 8 AM to 4 AM in-house and 4 PM to 8 PM by telephone/video. 8 PM to 8 AM emergent questions or overnight urgent questions should be addressed to Teleneurology On-call or [facility] neurohospitalist; contact information can be found on AMION

## 2020-12-19 ENCOUNTER — Inpatient Hospital Stay (HOSPITAL_COMMUNITY): Payer: Medicare Other

## 2020-12-19 LAB — BASIC METABOLIC PANEL
Anion gap: 9 (ref 5–15)
BUN: 41 mg/dL — ABNORMAL HIGH (ref 8–23)
CO2: 22 mmol/L (ref 22–32)
Calcium: 9.2 mg/dL (ref 8.9–10.3)
Chloride: 108 mmol/L (ref 98–111)
Creatinine, Ser: 1.93 mg/dL — ABNORMAL HIGH (ref 0.61–1.24)
GFR, Estimated: 34 mL/min — ABNORMAL LOW (ref >60)
Glucose, Bld: 85 mg/dL (ref 70–99)
Potassium: 4.7 mmol/L (ref 3.5–5.1)
Sodium: 139 mmol/L (ref 135–145)

## 2020-12-19 LAB — CBC
HCT: 45.4 % (ref 39.0–52.0)
Hemoglobin: 15.5 g/dL (ref 13.0–17.0)
MCH: 32.2 pg (ref 26.0–34.0)
MCHC: 34.1 g/dL (ref 30.0–36.0)
MCV: 94.2 fL (ref 80.0–100.0)
Platelets: 167 10*3/uL (ref 150–400)
RBC: 4.82 MIL/uL (ref 4.22–5.81)
RDW: 13.8 % (ref 11.5–15.5)
WBC: 6.7 10*3/uL (ref 4.0–10.5)
nRBC: 0 % (ref 0.0–0.2)

## 2020-12-19 LAB — VALPROIC ACID LEVEL: Valproic Acid Lvl: 87 ug/mL (ref 50.0–100.0)

## 2020-12-19 MED ORDER — RINGERS IV SOLN: INTRAVENOUS | Status: DC

## 2020-12-19 MED ORDER — CLOBAZAM 10 MG PO TABS
10.0000 mg | ORAL_TABLET | Freq: Two times a day (BID) | ORAL | Status: DC
  Administered 2020-12-19 – 2020-12-22 (×8): 10 mg via ORAL
  Filled 2020-12-19 (×8): qty 1

## 2020-12-19 MED ORDER — ENOXAPARIN SODIUM 40 MG/0.4ML IJ SOSY
40.0000 mg | PREFILLED_SYRINGE | INTRAMUSCULAR | Status: DC
  Administered 2020-12-19 – 2020-12-23 (×5): 40 mg via SUBCUTANEOUS
  Filled 2020-12-19 (×5): qty 0.4

## 2020-12-19 MED ORDER — LACTATED RINGERS IV SOLN: INTRAVENOUS | Status: AC

## 2020-12-19 MED ORDER — SODIUM CHLORIDE 0.9 % IV SOLN
200.0000 mg | Freq: Once | INTRAVENOUS | Status: AC
  Administered 2020-12-19: 200 mg via INTRAVENOUS
  Filled 2020-12-19 (×2): qty 20

## 2020-12-19 MED ORDER — LACTATED RINGERS IV SOLN: INTRAVENOUS | Status: DC

## 2020-12-19 MED ORDER — SODIUM CHLORIDE 0.9 % IV SOLN
100.0000 mg | Freq: Two times a day (BID) | INTRAVENOUS | Status: DC
  Administered 2020-12-20: 100 mg via INTRAVENOUS
  Filled 2020-12-19 (×2): qty 10

## 2020-12-19 NOTE — Progress Notes (Addendum)
Subjective: No complaints this morning.   Objective: Current vital signs: BP 129/89 (BP Location: Right Arm)   Pulse 75   Temp 97.8 F (36.6 C) (Oral)   Resp 14   SpO2 96%  Vital signs in last 24 hours: Temp:  [97.4 F (36.3 C)-98.4 F (36.9 C)] 97.8 F (36.6 C) (09/11 1118) Pulse Rate:  [75-76] 75 (09/11 1118) Resp:  [14-22] 14 (09/11 1118) BP: (124-131)/(82-101) 129/89 (09/11 1118) SpO2:  [96 %-99 %] 96 % (09/11 1118)  Intake/Output from previous day: 09/10 0701 - 09/11 0700 In: 108 [IV Piggyback:108] Out: 600 [Urine:600] Intake/Output this shift: No intake/output data recorded. Nutritional status:  Diet Order             Diet Heart Room service appropriate? Yes; Fluid consistency: Thin  Diet effective now                  HEENT: [place]/AT Lungs: Respirations unlabored Ext: Warm and well perfused.   Neurologic Exam: Ment: Awake with mildly decreased level of alertness. Orientation intact. Speech was fluent but there was about 30-60 seconds during interview when he seemed to lose his train of thought and speech became spars with decreased information content. Able to follow all commands.  CN: Pupils equal. EOMI. Fixates and tracks normally. No facial droop.  Motor: Normal strength x 4. Sensory: Intact to touch x 4 Cerebellar: No ataxia with FNF bilaterally   Lab Results: Results for orders placed or performed during the hospital encounter of 12/17/2020 (from the past 48 hour(s))  Valproic acid level     Status: None   Collection Time: 01/02/2021  8:33 AM  Result Value Ref Range   Valproic Acid Lvl 69 50.0 - 100.0 ug/mL    Comment: Performed at [facility], [address], [place], [place] [address]  CBC     Status: None   Collection Time: 12/19/20  3:08 AM  Result Value Ref Range   WBC 6.7 4.0 - 10.5 K/uL   RBC 4.82 4.22 - 5.81 MIL/uL   Hemoglobin 15.5 13.0 - 17.0 g/dL   HCT 45.4 39.0 - 52.0 %   MCV 94.2 80.0 - 100.0 fL   MCH 32.2 26.0 - 34.0 pg    MCHC 34.1 30.0 - 36.0 g/dL   RDW 13.8 11.5 - 15.5 %   Platelets 167 150 - 400 K/uL   nRBC 0.0 0.0 - 0.2 %    Comment: Performed at [facility], [place] [address] [place], [place] [address]  Basic metabolic panel     Status: Abnormal   Collection Time: 12/19/20  3:08 AM  Result Value Ref Range   Sodium 139 135 - 145 mmol/L   Potassium 4.7 3.5 - 5.1 mmol/L   Chloride 108 98 - 111 mmol/L   CO2 22 22 - 32 mmol/L   Glucose, Bld 85 70 - 99 mg/dL    Comment: Glucose reference range applies only to samples taken after fasting for at least 8 hours.   BUN 41 (H) 8 - 23 mg/dL   Creatinine, Ser 1.93 (H) 0.61 - 1.24 mg/dL   Calcium 9.2 8.9 - 10.3 mg/dL   GFR, Estimated 34 (L) >60 mL/min    Comment: (NOTE) Calculated using the CKD-EPI Creatinine Equation (2021)    Anion gap 9 5 - 15    Comment: Performed at [facility] [address], [place], [place] [address]  Valproic acid level     Status: None  Collection Time: 12/19/20  8:01 AM  Result Value Ref Range   Valproic Acid Lvl 87 50.0 - 100.0 ug/mL    Comment: Performed at [facility], [place] [address] [place], [place] [address]   *Note: Due to a large number of results and/or encounters for the requested time period, some results have not been displayed. A complete set of results can be found in Results Review.    Recent Results (from the past 240 hour(s))  Resp Panel by RT-PCR (Flu A&B, Covid) Nasopharyngeal Swab     Status: None   Collection Time: 12/15/20  1:03 PM   Specimen: Nasopharyngeal Swab; Nasopharyngeal(NP) swabs in vial transport medium  Result Value Ref Range Status   SARS Coronavirus 2 by RT PCR NEGATIVE NEGATIVE Final    Comment: (NOTE) SARS-CoV-2 target nucleic acids are NOT DETECTED.  The SARS-CoV-2 RNA is generally detectable in upper respiratory specimens during the acute phase of infection. The lowest concentration of SARS-CoV-2 viral copies this assay can detect is 138 copies/mL. A negative  result does not preclude SARS-Cov-2 infection and should not be used as the sole basis for treatment or other patient management decisions. A negative result may occur with  improper specimen collection/handling, submission of specimen other than nasopharyngeal swab, presence of viral mutation(s) within the areas targeted by this assay, and inadequate number of viral copies(<138 copies/mL). A negative result must be combined with clinical observations, patient history, and epidemiological information. The expected result is Negative.  Fact Sheet for Patients:  EntrepreneurPulse.com.au  Fact Sheet for Healthcare Providers:  IncredibleEmployment.be  This test is no t yet approved or cleared by the [place] FDA and  has been authorized for detection and/or diagnosis of SARS-CoV-2 by FDA under an Emergency Use Authorization (EUA). This EUA will remain  in effect (meaning this test can be used) for the duration of the COVID-19 declaration under Section 564(b)(1) of the Act, 21 U.S.C.section 360bbb-3(b)(1), unless the authorization is terminated  or revoked sooner.       Influenza A by PCR NEGATIVE NEGATIVE Final   Influenza B by PCR NEGATIVE NEGATIVE Final    Comment: (NOTE) The Xpert Xpress SARS-CoV-2/FLU/RSV plus assay is intended as an aid in the diagnosis of influenza from Nasopharyngeal swab specimens and should not be used as a sole basis for treatment. Nasal washings and aspirates are unacceptable for Xpert Xpress SARS-CoV-2/FLU/RSV testing.  Fact Sheet for Patients: EntrepreneurPulse.com.au  Fact Sheet for Healthcare Providers: IncredibleEmployment.be  This test is not yet approved or cleared by the [place] FDA and has been authorized for detection and/or diagnosis of SARS-CoV-2 by FDA under an Emergency Use Authorization (EUA). This EUA will remain in effect (meaning this test can be used)  for the duration of the COVID-19 declaration under Section 564(b)(1) of the Act, 21 U.S.C. section 360bbb-3(b)(1), unless the authorization is terminated or revoked.  Performed at [facility], [address] [place], [place] [address]     Lipid Panel No results for input(s): CHOL, TRIG, HDL, CHOLHDL, VLDL, LDLCALC in the last 72 hours.  Studies/Results: DG CHEST PORT 1 VIEW  Result Date: 12/19/2020 CLINICAL DATA:  Cough, confusion, slurred speech EXAM: PORTABLE CHEST 1 VIEW COMPARISON:  Prior chest x-ray 12/15/2020 FINDINGS: Left subclavian approach biventricular cardiac rhythm maintenance device with leads projecting over the right atrium, right ventricle and overlying the left ventricle. Lead position remains unchanged. Stable cardiac contour. Patient is status post median sternotomy with evidence of multivessel CABG. Atherosclerotic calcifications  present in the transverse aorta. The lungs are clear. No acute osseous abnormality. IMPRESSION: 1. Overall improved aeration with resolution of bibasilar atelectasis compared to 12/15/2020. 2. No acute cardiopulmonary process. 3. Ancillary findings as above. Electronically Signed   By: Jacqulynn Cadet M.D.   On: 12/19/2020 12:11   EEG adult  Result Date: 12/17/2020 Lora Havens, MD     12/19/2020  8:55 AM Patient Name: David [name]. MRN: [id number] Epilepsy Attending: Lora Havens Referring Physician/Provider: Dr Lesleigh Noe Duration: 12/17/2020 1641 to [place]  Patient history: 80 year old male with history of CVA, ICH, melanoma with mets to lung and brain who presented with aphasia.  EEG to evaluate for seizures.  Level of alertness: Awake, asleep  AEDs during EEG study: LEV, VPA  Technical aspects: This EEG study was done with scalp electrodes positioned according to the 10-20 International system of electrode placement. Electrical activity was acquired at a sampling rate of 500Hz  and reviewed with a high frequency  filter of 70Hz  and a low frequency filter of 1Hz . EEG data were recorded continuously and digitally stored.  Description: The posterior dominant rhythm consists of 8 Hz activity of moderate voltage (25-35 uV) seen predominantly in posterior head regions, symmetric and reactive to eye opening and eye closing. Sleep was characterized by vertex waves, sleep spindles (12-14Hz ), maximal fronto-central region. EEG also showed continuous 3 to 5 Hz sharply contoured theta-delta slowing with overriding 15 to 18 Hz beta activity in left frontal region consistent with prior craniotomy.  Seizure was noted arising from left centro-temporal region without clinical signs were noted. Onset of seizure on 12/17/2020 at 1742, ended at 1753. Concomitant EEG initially showed sharply contoured 13 to 14 Hz beta activity in left central temporal region which gradually involved all of left hemisphere followed by right hemisphere and evolved in morphology to 3 to 5 Hz theta- delta slowing.  Physiologic photic driving was not seen during photic stimulation.  Hyperventilation was not performed.    ABNORMALITY -Focal seizure, left centro- temporal region -Breach artifact, left frontal region  IMPRESSION: This study showed one seizure without clinical signs arising from left central temporal region on 12/17/2020 at 1742, ended at 1753. EEG also showed evidence of cortical dysfunction in left frontal region consistent with prior craniotomy.   Dr Curly Shores was notified  Lora Havens     Medications: Scheduled:  allopurinol  200 mg Oral Daily   amLODipine  2.5 mg Oral Daily   aspirin EC  81 mg Oral QPM   cloBAZam  10 mg Oral BID   divalproex  500 mg Oral Q8H   enoxaparin (LOVENOX) injection  40 mg Subcutaneous Q24H   levothyroxine  137 mcg Oral Q0600   metoprolol succinate  75 mg Oral Daily   sertraline  50 mg Oral Daily   Continuous:  lactated ringers 50 mL/hr at 12/19/20 1036   levETIRAcetam (KEPPRA) IVPB 750 mg (12/19/20 1039)   CT  head on 12/15/2020-No acute intracranial process    Spot EEG on 12/15/2020-1 seizure from left central temporal region at 1629 hrs. on 12/15/2020 ongoing till the end of study at 1634 hrs.  During the seizure patient was initially not able to respond but that got confused and restless and wanted to get out of bed to urinate.  EEG also showed evidence of cortical dysfunction in the frontal region consistent with prior craniotomy.   Spot EEG on 12/17/2020-focal seizure left central temporal region and breach artifact in the left frontal  region.  The study showed 1 seizure without clinical signs arising from left central temporal region at 1742 hrs. on 12/17/2020 which ended at 1753.  EEG also showed evidence of cortical dysfunction in the left frontal region consistent with prior craniotomy.   Assessment:  80 year old male with PMHx of melanoma with metastasis to the left frontal lobe complicated by hemorrhage in 2017, bone metastasis, APML, CAD, CKD, depression, anxiety, hypertension, hyperlipidemia  who presented to [facility] with progressive aphasia and confusion for several days. EEG studies revealed seizures emanating from the left central temporal region for which he is being treated with Depakote and renally dosed Keppra. So far seizures have not been brought under full control. He was transferred to [facility] for long-term EEG monitoring and further management. - VPA level Friday night mildly subtherapeutic at 47, repeat VPA this morning therapeutic at 69.  - Overall presentation most consistent with breakthrough partial complex seizures in the setting of patient with known seizure disorder from brain metastasis of melanoma s/p left frontal tumor excision in 2017 and radiation therapy. Spot EEGs reveal focal seizures in the left central temporal region with cortical dysfunction in left frontal region consistent with prior craniotomy.  - EKG 9/7: AV dual-paced rhythm. Per telephone discussion with Cardiology, the pacemaker  would protect against possible PR prolongation if a medication with this side effect were started.  - Yesterday his LTM EEG revealed recurrent seizure activity. Clobazam (Onfi) was started at that time at 10 mg po qd.   - Valproic acid level is therapeutic at 87, increased from yesterday's level of 69 mcg/mL.  - LTM EEG official report for today AM is pending. Preliminary read by Dr. Hortense Ramal reveals that he has been having an average of one seizure per hour overnight. She recommended increasing Onfi to 10 mg BID.    Recommendations: - Continue Depakote at 500 mg every 8 hours p.o. - Increasing Onfi to 10 mg po BID.  - Continue Keppra 750 mg twice daily-renal dose - Continue LTM EEG monitoring  - Versed 2 mg or diazepam 5 mg IV for any seizure lasting more than 5 minutes. - MMA level pending-continue to replete B12 orally. - Further adjustments to antiepileptics that are being considered if LTM EEG continues to show recurrent seizures (per Dr. Hortense Ramal): - Can add perampanel 16 mg po x 1, then scheduled dose of 8 mg qhs - If still with seizures after perampanel, can add gabapentin 300 mg po TID.    Addendum:  - LTM EEG  01/06/2021 1048 to 12/19/2020 1048: ABNORMALITiES: Focal seizure, left centro- temporal region. Breach artifact, left frontal region. IMPRESSION: This study showed 19 seizures arising from left central temporal region, average duration of seizures 7-10 minutes. During the seizure patient was unable to speak, follow commands with delay, endorsed having metallic taste in mouth. EEG also showed evidence of cortical dysfunction in left frontal region consistent with prior craniotomy. - Adding Vimpat to his regimen with a 200 mg IV load followed by scheduled dosing at 100 mg IV BID.    LOS: 1 day   @Electronically  signed: Dr. Kerney Elbe 12/19/2020  1:00 PM

## 2020-12-19 NOTE — Evaluation (Signed)
Occupational Therapy Evaluation Patient Details Name: David [name]. MRN: [id number] DOB: Feb 15, 1941 Today's Date: 12/19/2020    History of Present Illness Presented to ER secondary to AMS, word-finding difficulties; admitted for TIA/CVA work up.  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  Per neurology, patient noted with active seizure activity on EEG, correlating with episodes of speech difficulties. Keppra initiated (and planning to increase dosage). 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   Clinical Impression   This 80 yo male admitted with above to [facility] and transferred to [facility] for continuous EEG presents to acute OT with PLOF of being totally independent with basic ADLs pta. Currently he is min guard A for bed mobility but unable to progress any further due to seizure activity where he cannot talk and has blank stare unless you call his name then he will regard you and try to follow what you ask him to do.Did not resolve while sitting EOB (~5 minutes) so had him lay back down and within a minute he was talking again. We will continue to follow with CIR current recommendation but may progress to [place] once seizures are under control.    Follow Up Recommendations  CIR;Supervision/Assistance - 24 hour    Equipment Recommendations  None recommended by OT       Precautions / Restrictions Precautions Precautions: Fall      Mobility Bed Mobility Overal bed mobility: Needs Assistance Bed Mobility: Supine to Sit;Sit to Supine     Supine to sit: Min guard;HOB elevated Sit to supine: Min guard        Transfers                 General transfer comment: unable to  try due to seizure activity    Balance Overall balance assessment: Needs assistance Sitting-balance support: No upper extremity supported;Feet supported Sitting balance-Leahy Scale: Fair                                     ADL either performed or assessed with clinical judgement   ADL                                         General ADL Comments: Did not get this far due to seizure activity     Vision Baseline Vision/History: 1 Wears glasses Ability to See in Adequate Light: 0 Adequate              Pertinent Vitals/Pain Pain Assessment: No/denies pain     Hand Dominance Right   Extremity/Trunk Assessment Upper Extremity Assessment Upper Extremity Assessment: Overall WFL for tasks assessed           Communication Communication Communication: Expressive difficulties (when having seizure episode (cannot talk))   Cognition Arousal/Alertness: Awake/alert Behavior During Therapy: WFL for tasks assessed/performed Overall Cognitive Status: Impaired/Different from baseline                                 General Comments: Pt in bed speaking to me and answering questions  without any issues, sat up on EOB and within 1 minute there was a change in ability to answer questions and in 2 minutes he was not speaking at all.              Home Living Family/patient expects to be discharged to:: Private residence Living Arrangements: Spouse/significant other Available Help at Discharge: Family;Personal care attendant Type of Home: House Home Access: Stairs to enter [employer] of Steps: 2 Entrance Stairs-Rails: Left Home Layout: One level     Bathroom Shower/Tub: Occupational psychologist: Handicapped height     Wing: [place] - quad   Additional Comments: PCA is with them 4 hours a day 7 days a week      Prior Functioning/Environment Level of Independence: Independent with assistive device(s)         Comments: Mod indep with ADLs, household and limited community mobilization; intermittent use of quad cane (esp if outside of the home).  Endorses single fall in previous year.  Has aide that comes 4 hours/day to assist with household chores and self-care as needed.        OT Problem List: Impaired balance (sitting and/or standing)      OT Treatment/Interventions: Self-care/ADL training;DME and/or AE instruction;Patient/family education;Balance training    OT Goals(Current goals can be found in the care plan section) Acute Rehab OT Goals Patient Stated Goal: to stop having these seizures OT Goal Formulation: With patient Time For Goal Achievement: 01/02/21 Potential to Achieve Goals: Good  OT Frequency: Min 2X/week    AM-PAC OT "6 Clicks" Daily Activity     Outcome Measure Help from another person eating meals?: None Help from another person taking care of personal grooming?: A Little Help from another person toileting, which includes using toliet, bedpan, or urinal?: Total Help from another person bathing (including washing, rinsing, drying)?: Total Help from another person to put on and taking off regular upper body clothing?: Total Help from another person to put on and taking off regular lower body clothing?: Total 6 Click Score: 11   End of Session Nurse Communication:  (seizure)  Activity Tolerance:  (limited by seizure) Patient left: in bed;with call bell/phone within reach;with bed alarm set;with family/visitor present;with nursing/sitter in room  OT Visit Diagnosis: Other abnormalities of gait and mobility (R26.89);Other symptoms and signs involving cognitive function                Time: 1400-1424 OT Time Calculation (min): 24 min Charges:  OT General Charges $OT Visit: 1 Visit OT Evaluation $OT Eval Moderate Complexity: 1 Mod  Golden Circle, OTR/L Acute [employer] Pager [phone] Office [phone]    Almon Register 12/19/2020,  4:41 PM

## 2020-12-19 NOTE — Progress Notes (Signed)
Daughter Silas Sacramento called to check on pt's condition. Update given. Daughter to call back around 0800 today and get an update and hopefully talk to doctor.

## 2020-12-19 NOTE — Procedures (Signed)
Patient Name: David [name].  MRN: [id number]  Epilepsy Attending: Lora Havens  Referring Physician/Provider: Dr Amie [place] Duration: 12/30/2020 1048 to 12/19/2020 1048   Patient history: 80 year old male with history of CVA, ICH, melanoma with mets to lung and brain who presented with aphasia.  EEG to evaluate for seizures.   Level of alertness: Awake, asleep   AEDs during EEG study: LEV, VPA   Technical aspects: This EEG study was done with scalp electrodes positioned according to the 10-20 International system of electrode placement. Electrical activity was acquired at a sampling rate of 500Hz  and reviewed with a high frequency filter of 70Hz  and a low frequency filter of 1Hz . EEG data were recorded continuously and digitally stored.    Description: The posterior dominant rhythm consists of 8 Hz activity of moderate voltage (25-35 uV) seen predominantly in posterior head regions, symmetric and reactive to eye opening and eye closing. Sleep was characterized by vertex waves, sleep spindles (12-14Hz ), maximal fronto-central region. EEG also showed continuous 3 to 5 Hz sharply contoured theta-delta slowing with overriding 15 to 18 Hz beta activity in left frontal region consistent with prior craniotomy.    19 seizures were noted arising from left centro-temporal region during which patient was unable to speak, follow commands with delay, endorsed having metallic taste in mouth. Concomitant EEG initially showed sharply contoured 13 to 14 Hz beta activity in left central temporal region which gradually involved all of left hemisphere followed by right hemisphere and evolved in morphology to 3 to 5 Hz theta- delta slowing.    Hyperventilation and photic stimulation were not performed.      ABNORMALITY -Focal seizure, left centro- temporal region -Breach artifact, left frontal region   IMPRESSION: This study showed 19 seizures arising from left central temporal region, average  duration of seizures 7-10 minutes. During the seizure patient was unable to speak, follow commands with delay, endorsed having metallic taste in mouth.EEG also showed evidence of cortical dysfunction in left frontal region consistent with prior craniotomy.  Edmond Ginsberg [name]

## 2020-12-19 NOTE — Progress Notes (Signed)
PROGRESS NOTE    David [name].  PPI:[id number] DOB: [birth year] DOA: 01/05/2021 PCP: [name], MD   Brief Narrative: 80 year old with PMH significant for benign familia hematuria, BPH, CAD, N-STEMI, Hypothyroidism, sleep apnea on CPAP, CML, melanoma metastatic to lung and brain, hemorrhagic CVA with aphasia in 2017 who was admitted 12/15/2020 to [facility] due to aphasia, speech arrest, found to have seizures originating from left central temporal region. He was evaluated by neurologist. He was started on Depakote, keppra dose increase, follow by discontinuation of medications that lower seizure threshold (ritalin, trazodone). Patient was transfer to [facility] for  LTM EEG.    Assessment & Plan:   Principal Problem:   Focal seizures ([place]) Active Problems:   ICH (intracerebral hemorrhage) (HCC) - L frontal due to HTN vs CAA   Benign essential HTN   OSA on CPAP   Hemiplegia and hemiparesis following nontraumatic intracerebral hemorrhage affecting right dominant side (HCC)   Chronic kidney disease, stage III (moderate) (HCC)   Metastatic melanoma to lung [facility])   Acquired hypothyroidism   Hyperlipidemia  1-Focal Seizure:  -Patient presented with aphasia,speech arrest, confusion.  -Appreciate neurologist  assistance.  -During this admission Keppra dose increase to 750 mg BID.  -Started on Depakote 500 mg TID>  -Started on Clobazam 9/10, Dose change to BID 9/11. -Plan to continue with LTM EEG.  -Discussed with Dr Cheral Marker, CT head this admission with no acute intracranial process, no need for MRI.  -No electrolytes abnormalities. No evidence of infection.  -Valproic acid 47--69--87. -Plan for today is to increase Clobazam to BID.  -Formal LTM EEG reading pending.   2-Essential Hypertension: Continue with amlodipine and metoprolol.  3-history of nontraumatic ICH, HO hemiplegia.  Prior history of hemiplegia and hemiparesis. OT consulted.  Metastatic melanoma to lung and  brain: Status post left frontal craniotomy for metastatic melanoma resection. Follow with Dr. Mickeal Skinner as an outpatient.  Hypothyroidism: Continue with Synthroid.  AKI on CKD stage IIIa: Creatinine range 1.6 to 1.8. Mildly increased creatinine today to 1.9.  I will add gentle hydration. Cr peak to 2.2 this admission.   Hyperlipidemia: Continue with rosuvastatin. Vitamin B-12; 383. MMA pending. Continue with supplement.   Cough:  Incentive spirometry.  Repeated chest x ray: improved aeration with resolution of bibasilar atelectasis compared 12/15/2020. No leukocytosis, Afebrile.   Major Depressive Disorder:  Continue Sertraline.  Trazodone on hold by neurology./   OSA, on CPAP.  HO of complete AV block S/P Pacemaker.   Estimated body mass index is 30.41 kg/m as calculated from the following:   Height as of 12/15/20: 5\' 8"  (1.727 m).   Weight as of 12/15/20: 90.7 kg.   DVT prophylaxis: Start Lovenox for DVT prophylaxis, discussed with neurology. Code Status: DNR Family Communication: Care discussed with daughter (Dr. [name]) over the phone during my rounds today. Disposition Plan:  Status is: Inpatient  Remains inpatient appropriate because:IV treatments appropriate due to intensity of illness or inability to take PO  Dispo: The patient is from: Home              Anticipated d/c is to: Home              Patient currently is not medically stable to d/c.   Difficult to place patient No        Consultants:  Neurology  Procedures:  LTM-EEG  Antimicrobials:    Subjective: Patient was alert and conversant during my evaluation.  He was able to communicate,  no significant word finding difficulty during my evaluation.  He might pronounce wrong the beginning of one word during my assessment.  He is aware when he is having an episode of difficulty talking.  He feels that he still having frequent episode, every hour.  Family friend at bedside is concern that he has been  coughing. I plan to order Incentive spirometry, repeat chest x ray. Get OT>   Objective: Vitals:   12/21/2020 2016 12/25/2020 2338 12/19/20 0409 12/19/20 0724  BP: 129/85 124/87 131/82 (!) 129/101  Pulse: 76 75 75 75  Resp: 18 16 18  (!) 22  Temp: 97.8 F (36.6 C) 97.9 F (36.6 C) 98.4 F (36.9 C) (!) 97.4 F (36.3 C)  TempSrc: Oral Oral Oral Oral  SpO2: 99% 96% 97% 96%    Intake/Output Summary (Last 24 hours) at 12/19/2020 0726 Last data filed at 12/19/2020 9147 Gross per 24 hour  Intake 108 ml  Output 600 ml  Net -492 ml   There were no vitals filed for this visit.  Examination:  General exam: Appears calm and comfortable  Respiratory system: Clear to auscultation. Respiratory effort normal. Cardiovascular system: S1 & S2 heard, RRR.  Gastrointestinal system: Abdomen is nondistended, soft and nontender. No organomegaly or masses felt. Normal bowel sounds heard. Central nervous system: Alert and oriented. Follows command, raise upper and lower extremities.  Extremities: No edema   Data Reviewed: I have personally reviewed following labs and imaging studies  CBC: Recent Labs  Lab 12/14/20 1129 12/15/20 1206 12/16/20 0456 12/17/20 2048 12/19/20 0308  WBC 5.1 4.9 5.3 5.7 6.7  NEUTROABS 3.6 3.7  --  4.2  --   HGB 15.6 16.3 15.5 15.6 15.5  HCT 47.2 46.5 45.2 45.7 45.4  MCV 95.5 92.1 93.6 94.8 94.2  PLT 169 168 173 165 829   Basic Metabolic Panel: Recent Labs  Lab 12/14/20 1129 12/15/20 1206 12/15/20 1543 12/16/20 0456 12/17/20 2048 12/19/20 0308  NA 138 137  --  137 136 139  K 4.6 4.8  --  4.6 4.8 4.7  CL 103 103  --  103 103 108  CO2 24 24  --  23 27 22   GLUCOSE 126* 133*  --  88 87 85  BUN 48* 43*  --  43* 45* 41*  CREATININE 2.29* 2.07* 2.02* 1.84* 1.85* 1.93*  CALCIUM 9.0 9.3  --  9.1 9.1 9.2   GFR: Estimated Creatinine Clearance: 32.8 mL/min (A) (by C-G formula based on SCr of 1.93 mg/dL (H)). Liver Function Tests: Recent Labs  Lab 12/14/20 1129  12/15/20 1206 12/17/20 2048  AST 25 25 17   ALT 14 14 12   ALKPHOS 54 51 48  BILITOT 0.6 0.7 0.6  PROT 7.3 6.9 6.5  ALBUMIN 4.2 4.3 3.7   No results for input(s): LIPASE, AMYLASE in the last 168 hours. Recent Labs  Lab 12/15/20 1543 12/17/20 2048  AMMONIA 17 18   Coagulation Profile: Recent Labs  Lab 12/15/20 1206  INR 1.0   Cardiac Enzymes: No results for input(s): CKTOTAL, CKMB, CKMBINDEX, TROPONINI in the last 168 hours. BNP (last 3 results) No results for input(s): PROBNP in the last 8760 hours. HbA1C: No results for input(s): HGBA1C in the last 72 hours. CBG: No results for input(s): GLUCAP in the last 168 hours. Lipid Profile: No results for input(s): CHOL, HDL, LDLCALC, TRIG, CHOLHDL, LDLDIRECT in the last 72 hours. Thyroid Function Tests: No results for input(s): TSH, T4TOTAL, FREET4, T3FREE, THYROIDAB in the last 72 hours.  Anemia Panel: No results for input(s): VITAMINB12, FOLATE, FERRITIN, TIBC, IRON, RETICCTPCT in the last 72 hours. Sepsis Labs: No results for input(s): PROCALCITON, LATICACIDVEN in the last 168 hours.  Recent Results (from the past 240 hour(s))  Resp Panel by RT-PCR (Flu A&B, Covid) Nasopharyngeal Swab     Status: None   Collection Time: 12/15/20  1:03 PM   Specimen: Nasopharyngeal Swab; Nasopharyngeal(NP) swabs in vial transport medium  Result Value Ref Range Status   SARS Coronavirus 2 by RT PCR NEGATIVE NEGATIVE Final    Comment: (NOTE) SARS-CoV-2 target nucleic acids are NOT DETECTED.  The SARS-CoV-2 RNA is generally detectable in upper respiratory specimens during the acute phase of infection. The lowest concentration of SARS-CoV-2 viral copies this assay can detect is 138 copies/mL. A negative result does not preclude SARS-Cov-2 infection and should not be used as the sole basis for treatment or other patient management decisions. A negative result may occur with  improper specimen collection/handling, submission of specimen  other than nasopharyngeal swab, presence of viral mutation(s) within the areas targeted by this assay, and inadequate number of viral copies(<138 copies/mL). A negative result must be combined with clinical observations, patient history, and epidemiological information. The expected result is Negative.  Fact Sheet for Patients:  EntrepreneurPulse.com.au  Fact Sheet for Healthcare Providers:  IncredibleEmployment.be  This test is no t yet approved or cleared by the [place] FDA and  has been authorized for detection and/or diagnosis of SARS-CoV-2 by FDA under an Emergency Use Authorization (EUA). This EUA will remain  in effect (meaning this test can be used) for the duration of the COVID-19 declaration under Section 564(b)(1) of the Act, 21 U.S.C.section 360bbb-3(b)(1), unless the authorization is terminated  or revoked sooner.       Influenza A by PCR NEGATIVE NEGATIVE Final   Influenza B by PCR NEGATIVE NEGATIVE Final    Comment: (NOTE) The Xpert Xpress SARS-CoV-2/FLU/RSV plus assay is intended as an aid in the diagnosis of influenza from Nasopharyngeal swab specimens and should not be used as a sole basis for treatment. Nasal washings and aspirates are unacceptable for Xpert Xpress SARS-CoV-2/FLU/RSV testing.  Fact Sheet for Patients: EntrepreneurPulse.com.au  Fact Sheet for Healthcare Providers: IncredibleEmployment.be  This test is not yet approved or cleared by the [place] FDA and has been authorized for detection and/or diagnosis of SARS-CoV-2 by FDA under an Emergency Use Authorization (EUA). This EUA will remain in effect (meaning this test can be used) for the duration of the COVID-19 declaration under Section 564(b)(1) of the Act, 21 U.S.C. section 360bbb-3(b)(1), unless the authorization is terminated or revoked.  Performed at [facility], [address],  [place], [place] [address]          Radiology Studies: EEG adult  Result Date: December 26, 2020 Lora Havens, MD     2020-12-26  8:37 PM Patient Name: David [name]. MRN: [id number] Epilepsy Attending: Lora Havens Referring Physician/Provider: Dr Lesleigh Noe Duration: 12/26/20 1641 to [place]  Patient history: 80 year old male with history of CVA, ICH, melanoma with mets to lung and brain who presented with aphasia.  EEG to evaluate for seizures.  Level of alertness: Awake, asleep  AEDs during EEG study: LEV, VPA  Technical aspects: This EEG study was done with scalp electrodes positioned according to the 10-20 International system of electrode placement. Electrical activity was acquired at a sampling rate of 500Hz  and reviewed with a high frequency filter of 70Hz  and a low frequency filter  of 1Hz . EEG data were recorded continuously and digitally stored.  Description: The posterior dominant rhythm consists of 8 Hz activity of moderate voltage (25-35 uV) seen predominantly in posterior head regions, symmetric and reactive to eye opening and eye closing. Sleep was characterized by vertex waves, sleep spindles (12-14Hz ), maximal fronto-central region. EEG also showed continuous 3 to 5 Hz sharply contoured theta-delta slowing with overriding 15 to 18 Hz beta activity in left frontal region consistent with prior craniotomy.  Seizure was noted arising from left centro-temporal region without clinical signs were noted. Onset of seizure on 12/17/2020 at 1742, ended at 1753. Concomitant EEG initially showed sharply contoured 13 to 14 Hz beta activity in left central temporal region which gradually involved all of left hemisphere followed by right hemisphere and evolved in morphology to 3 to 5 Hz theta- delta slowing.  Physiologic photic driving was not seen during photic stimulation.  Hyperventilation and photic stimulation were not performed.    ABNORMALITY -Focal seizure, left centro- temporal region -Breach  artifact, left frontal region  IMPRESSION: This study showed one seizure without clinical signs arising from left central temporal region on 12/17/2020 at 1742, ended at 1753. EEG also showed evidence of cortical dysfunction in left frontal region consistent with prior craniotomy.   Dr Curly Shores was notified  Lora Havens         Scheduled Meds:  allopurinol  200 mg Oral Daily   amLODipine  2.5 mg Oral Daily   aspirin EC  81 mg Oral QPM   cloBAZam  10 mg Oral Daily   divalproex  500 mg Oral Q8H   levothyroxine  137 mcg Oral Q0600   metoprolol succinate  75 mg Oral Daily   sertraline  50 mg Oral Daily   Continuous Infusions:  levETIRAcetam (KEPPRA) IVPB 750 mg (12/09/2020 2118)     LOS: 1 day    Time spent: 35 minutes.     Elmarie Shiley, MD [facility]   If 7PM-7AM, please contact night-coverage [email or web address]  12/19/2020, 7:26 AM

## 2020-12-20 LAB — BASIC METABOLIC PANEL
Anion gap: 10 (ref 5–15)
BUN: 46 mg/dL — ABNORMAL HIGH (ref 8–23)
CO2: 22 mmol/L (ref 22–32)
Calcium: 9.2 mg/dL (ref 8.9–10.3)
Chloride: 107 mmol/L (ref 98–111)
Creatinine, Ser: 1.97 mg/dL — ABNORMAL HIGH (ref 0.61–1.24)
GFR, Estimated: 34 mL/min — ABNORMAL LOW (ref >60)
Glucose, Bld: 90 mg/dL (ref 70–99)
Potassium: 5.1 mmol/L (ref 3.5–5.1)
Sodium: 139 mmol/L (ref 135–145)

## 2020-12-20 LAB — METHYLMALONIC ACID, SERUM: Methylmalonic Acid, Quantitative: 297 nmol/L (ref 0–378)

## 2020-12-20 LAB — MAGNESIUM: Magnesium: 2.5 mg/dL — ABNORMAL HIGH (ref 1.7–2.4)

## 2020-12-20 MED ORDER — SODIUM CHLORIDE 0.9 % IV SOLN
150.0000 mg | Freq: Two times a day (BID) | INTRAVENOUS | Status: DC
  Administered 2020-12-20 – 2021-01-04 (×29): 150 mg via INTRAVENOUS
  Filled 2020-12-20 (×32): qty 15

## 2020-12-20 MED ORDER — MIDAZOLAM HCL 2 MG/2ML IJ SOLN: 2.0000 mg | INTRAMUSCULAR | Status: DC | PRN

## 2020-12-20 MED ORDER — MIDAZOLAM HCL 2 MG/2ML IJ SOLN: 2.0000 mg | Freq: Once | INTRAMUSCULAR | Status: DC

## 2020-12-20 MED ORDER — PERAMPANEL 2 MG PO TABS
12.0000 mg | ORAL_TABLET | Freq: Once | ORAL | Status: AC
  Administered 2020-12-20: 12 mg via ORAL
  Filled 2020-12-20: qty 6

## 2020-12-20 NOTE — Progress Notes (Signed)
Both Dr. Tonie Griffith and Dr. Rory Percy here to see pt. Dr. Rory Percy cancelled the head CT that Dr. Tonie Griffith ordered earlier. Dr. Rory Percy stated don't worry about the EEG leads being off they can decifer what the pattern is. He also stated the pt looks like he did a couple nights ago. Pt told doctor he was just having problems speaking at times which isn't new. Dr. Rory Percy then stated he's ok and that is what we're trying to find out with the EEG if this is seizure related or something else. Dr.Arora stated he'll order him some Ativan to calm him down and let him go to sleep. Pt has been sleeping on and off all shift and was only awake for midnight VS and neuro assessment.

## 2020-12-20 NOTE — Progress Notes (Signed)
Notified by RN that patient status is different now. He is not able to state his birthday and more confused than normal.  He moves all extremities spontaneously.  He has known metastatic disease to brain. Has seizures. Currently undergoing continuous EEG.  Neurology at bedside as well. Condition stable per Dr. Rory Percy and no need to image at this time.  Ativan being ordered.

## 2020-12-20 NOTE — Plan of Care (Signed)
Called by RN re: patient being more confused as well as pulling EEG leads.  Couple of EEG leads were off. On a brief examination,  Awake, alert Speech is mildly dysarthric, has some aphasia with longer sentences. No significant cranial nerve deficits including no facial droop which was initially suspected by the nurse on the left side. Motor and sensory exam also unremarkable At this time, I do not see a need for sending him for another CT scan. The EEG leads will be fixed in the morning. I would recommend giving him 2 mg of Versed-predominantly for agitation and also for fluctuating speech which might be reflective of underlying seizure. Plan for adjustment of antiepileptics already underway in discussion with the epileptologist-we will follow the patient tomorrow. Plan discussed at the bedside with the hospitalist Dr. Tonie Griffith and patient's RN.  -- Amie Portland, MD Neurologist [facility] Pager: [phone]

## 2020-12-20 NOTE — Procedures (Addendum)
Patient Name: David [name].  MRN: [id number]  Epilepsy Attending: Lora Havens  Referring Physician/Provider: Dr Amie Portland Duration: 12/19/2020 1048 to 12/20/2020 1048   Patient history: 80 year old male with history of CVA, ICH, melanoma with mets to lung and brain who presented with aphasia.  EEG to evaluate for seizures.   Level of alertness: Awake, asleep   AEDs during EEG study: LEV, VPA, Onfi   Technical aspects: This EEG study was done with scalp electrodes positioned according to the 10-20 International system of electrode placement. Electrical activity was acquired at a sampling rate of 500Hz  and reviewed with a high frequency filter of 70Hz  and a low frequency filter of 1Hz . EEG data were recorded continuously and digitally stored.    Description: The posterior dominant rhythm consists of 8 Hz activity of moderate voltage (25-35 uV) seen predominantly in posterior head regions, symmetric and reactive to eye opening and eye closing. Sleep was characterized by vertex waves, sleep spindles (12-14Hz ), maximal fronto-central region. EEG also showed continuous 3 to 5 Hz sharply contoured theta-delta slowing with overriding 15 to 18 Hz beta activity in left frontal region consistent with prior craniotomy.    10 seizures were noted arising from left centro-temporal region during which patient was unable to speak, follow commands with delay, endorsed having metallic taste in mouth. Concomitant EEG initially showed sharply contoured 13 to 14 Hz beta activity in left central temporal region which gradually involved all of left hemisphere followed by right hemisphere and evolved in morphology to 3 to 5 Hz theta- delta slowing.     Patient pulled out his EEG electrodes and EEG was difficult to interpret between 0315 on 12/20/2020 to 0930 on 12/20/2020.    ABNORMALITY -Focal seizure, left centro- temporal region -Breach artifact, left frontal region   IMPRESSION: This study showed 10  seizures arising from left central temporal region, average duration of seizures 7-10 minutes. During the seizure patient was unable to speak, follow commands with delay. EEG also showed evidence of cortical dysfunction in left frontal region consistent with prior craniotomy.  Patient pulled out his EEG electrodes and EEG was difficult to interpret between 0315 on 12/20/2020 to 0930 on 12/20/2020.    Elanah Osmanovic [name]

## 2020-12-20 NOTE — Progress Notes (Signed)
Went into rm to do assessment and VS and light was left on from [facility] getting labs. Pt was awake and had pulled off all the EEG wires. Monitor taken off of pt and placed on machine. Pt staring out into space. Pt disoriented x4. Kept saying I don't know. Pt able to follow commands. Right grip weak now. Can't lift legs against resistance. Eyes 2+ [place]AR[place]

## 2020-12-20 NOTE — Progress Notes (Signed)
Pt again said tonight "your skin is different than mine." I asked what he meant and he stated "you're darker than me." I said that's because I'm tanned. Pt then chuckled. Then pt stated "I like your hair." My hair was wet from sweating and a mess. I looked at him and he said "oh I'm being Facetious." Then he chuckled again

## 2020-12-20 NOTE — Progress Notes (Signed)
PROGRESS NOTE    David [name].  NTI:[id number] DOB: November 17, 1940 DOA: 12/25/2020 PCP: [name], MD   Brief Narrative: 80 year old with PMH significant for benign familia hematuria, BPH, CAD, N-STEMI, Hypothyroidism, sleep apnea on CPAP, CML, melanoma metastatic to lung and brain, hemorrhagic CVA with aphasia in 2017 who was admitted 12/15/2020 to [facility] due to aphasia, speech arrest, found to have seizures originating from left central temporal region. He was evaluated by neurologist. He was started on Depakote, keppra dose increase, follow by discontinuation of medications that lower seizure threshold (ritalin, trazodone). Patient was transfer to [facility] for  LTM EEG.    Assessment & Plan:   Principal Problem:   Focal seizures ([place]) Active Problems:   ICH (intracerebral hemorrhage) (HCC) - L frontal due to HTN vs CAA   Benign essential HTN   OSA on CPAP   Hemiplegia and hemiparesis following nontraumatic intracerebral hemorrhage affecting right dominant side (HCC)   Chronic kidney disease, stage III (moderate) (HCC)   Metastatic melanoma to lung [facility])   Acquired hypothyroidism   Hyperlipidemia  1-Focal Seizure, refractory -Patient presented with aphasia,speech arrest, confusion.  -Appreciate neurologist  assistance.  -Continue with  Keppra 750 mg BID.  -Continue with Depakote 500 mg TID>  -Continue with Clobazam, Dose change to BID 9/11. -Plan to continue with LTM EEG.  -CT head this admission with no acute intracranial process. -No electrolytes abnormalities. No evidence of infection.  -Discussed with Dr Hortense Ramal, patient was started on Vimpat 9/11. She will continue to monitor EEG, if further seizure plan to increase Vimpat. If seizure persist, could load with perampanel. Will consider High dose steroids 9/13 if seizure persist.   2-Essential Hypertension: Continue with amlodipine and metoprolol.  3-history of nontraumatic ICH, HO hemiplegia.  Prior history of  hemiplegia and hemiparesis. OT consulted.  Metastatic melanoma to lung and brain: Status post left frontal craniotomy for metastatic melanoma resection. Follow with Dr. Mickeal Skinner as an outpatient.  Hypothyroidism: Continue with Synthroid.  AKI on CKD stage IIIa: Creatinine range 1.6 to 1.8. Cr peak to 2.2 this admission.  Cr stable 1.9, reduce fluids to avoid overload.   Hyperlipidemia: Continue with rosuvastatin. Vitamin B-12; 383. MMA Normal 297. Continue with supplement.   Cough:  Incentive spirometry.  Repeated chest x ray: improved aeration with resolution of bibasilar atelectasis compared 12/15/2020. No leukocytosis, Afebrile.   Major Depressive Disorder:  Continue Sertraline.  Trazodone on hold by neurology./   OSA, on CPAP.  HO of complete AV block S/P Pacemaker.   Estimated body mass index is 30.41 kg/m as calculated from the following:   Height as of 12/15/20: 5\' 8"  (1.727 m).   Weight as of 12/15/20: 90.7 kg.   DVT prophylaxis: Start Lovenox for DVT prophylaxis, discussed with neurology. Code Status: DNR Family Communication:  Disposition Plan:  Status is: Inpatient  Remains inpatient appropriate because:IV treatments appropriate due to intensity of illness or inability to take PO  Dispo: The patient is from: Home              Anticipated d/c is to: Home              Patient currently is not medically stable to d/c.   Difficult to place patient No        Consultants:  Neurology  Procedures:  LTM-EEG  Antimicrobials:    Subjective: Events from last night notice. He was confuse overnight. Removes EEG wires. He is alert on my evaluation, speech clea.  He was oriented to place, situation, he appears tired. He report improvement of cough. He denies pain, dyspnea.  He would like to eat something. His nurse came to bedside and assisted him with breakfast.    Objective: Vitals:   12/19/20 2002 12/19/20 2353 12/20/20 0438 12/20/20 0755  BP: 134/86  (!) 152/99 (!) 152/97 (!) 142/95  Pulse: 75 75 81 81  Resp: 20 (!) 22 20 18   Temp: 97.7 F (36.5 C) 98.7 F (37.1 C)  98.3 F (36.8 C)  TempSrc: Oral Oral  Oral  SpO2: 95% 98% 95% 97%    Intake/Output Summary (Last 24 hours) at 12/20/2020 1309 Last data filed at 12/20/2020 0758 Gross per 24 hour  Intake 436.23 ml  Output 700 ml  Net -263.77 ml    There were no vitals filed for this visit.  Examination:  General exam: NAD Respiratory system: CTA Cardiovascular system: S 1, S 2 RRR Gastrointestinal system: BS present, soft, nt Central nervous system: Alert, oriented times 3, follows command Extremities: No edema.    Data Reviewed: I have personally reviewed following labs and imaging studies  CBC: Recent Labs  Lab 12/14/20 1129 12/15/20 1206 12/16/20 0456 12/17/20 2048 12/19/20 0308  WBC 5.1 4.9 5.3 5.7 6.7  NEUTROABS 3.6 3.7  --  4.2  --   HGB 15.6 16.3 15.5 15.6 15.5  HCT 47.2 46.5 45.2 45.7 45.4  MCV 95.5 92.1 93.6 94.8 94.2  PLT 169 168 173 165 657    Basic Metabolic Panel: Recent Labs  Lab 12/15/20 1206 12/15/20 1543 12/16/20 0456 12/17/20 2048 12/19/20 0308 12/20/20 0316  NA 137  --  137 136 139 139  K 4.8  --  4.6 4.8 4.7 5.1  CL 103  --  103 103 108 107  CO2 24  --  23 27 22 22   GLUCOSE 133*  --  88 87 85 90  BUN 43*  --  43* 45* 41* 46*  CREATININE 2.07* 2.02* 1.84* 1.85* 1.93* 1.97*  CALCIUM 9.3  --  9.1 9.1 9.2 9.2  MG  --   --   --   --   --  2.5*    GFR: Estimated Creatinine Clearance: 32.2 mL/min (A) (by C-G formula based on SCr of 1.97 mg/dL (H)). Liver Function Tests: Recent Labs  Lab 12/14/20 1129 12/15/20 1206 12/17/20 2048  AST 25 25 17   ALT 14 14 12   ALKPHOS 54 51 48  BILITOT 0.6 0.7 0.6  PROT 7.3 6.9 6.5  ALBUMIN 4.2 4.3 3.7    No results for input(s): LIPASE, AMYLASE in the last 168 hours. Recent Labs  Lab 12/15/20 1543 12/17/20 2048  AMMONIA 17 18    Coagulation Profile: Recent Labs  Lab 12/15/20 1206   INR 1.0    Cardiac Enzymes: No results for input(s): CKTOTAL, CKMB, CKMBINDEX, TROPONINI in the last 168 hours. BNP (last 3 results) No results for input(s): PROBNP in the last 8760 hours. HbA1C: No results for input(s): HGBA1C in the last 72 hours. CBG: No results for input(s): GLUCAP in the last 168 hours. Lipid Profile: No results for input(s): CHOL, HDL, LDLCALC, TRIG, CHOLHDL, LDLDIRECT in the last 72 hours. Thyroid Function Tests: No results for input(s): TSH, T4TOTAL, FREET4, T3FREE, THYROIDAB in the last 72 hours. Anemia Panel: No results for input(s): VITAMINB12, FOLATE, FERRITIN, TIBC, IRON, RETICCTPCT in the last 72 hours. Sepsis Labs: No results for input(s): PROCALCITON, LATICACIDVEN in the last 168 hours.  Recent Results (from the past  240 hour(s))  Resp Panel by RT-PCR (Flu A&B, Covid) Nasopharyngeal Swab     Status: None   Collection Time: 12/15/20  1:03 PM   Specimen: Nasopharyngeal Swab; Nasopharyngeal(NP) swabs in vial transport medium  Result Value Ref Range Status   SARS Coronavirus 2 by RT PCR NEGATIVE NEGATIVE Final    Comment: (NOTE) SARS-CoV-2 target nucleic acids are NOT DETECTED.  The SARS-CoV-2 RNA is generally detectable in upper respiratory specimens during the acute phase of infection. The lowest concentration of SARS-CoV-2 viral copies this assay can detect is 138 copies/mL. A negative result does not preclude SARS-Cov-2 infection and should not be used as the sole basis for treatment or other patient management decisions. A negative result may occur with  improper specimen collection/handling, submission of specimen other than nasopharyngeal swab, presence of viral mutation(s) within the areas targeted by this assay, and inadequate number of viral copies(<138 copies/mL). A negative result must be combined with clinical observations, patient history, and epidemiological information. The expected result is Negative.  Fact Sheet for Patients:   EntrepreneurPulse.com.au  Fact Sheet for Healthcare Providers:  IncredibleEmployment.be  This test is no t yet approved or cleared by the [place] FDA and  has been authorized for detection and/or diagnosis of SARS-CoV-2 by FDA under an Emergency Use Authorization (EUA). This EUA will remain  in effect (meaning this test can be used) for the duration of the COVID-19 declaration under Section 564(b)(1) of the Act, 21 U.S.C.section 360bbb-3(b)(1), unless the authorization is terminated  or revoked sooner.       Influenza A by PCR NEGATIVE NEGATIVE Final   Influenza B by PCR NEGATIVE NEGATIVE Final    Comment: (NOTE) The Xpert Xpress SARS-CoV-2/FLU/RSV plus assay is intended as an aid in the diagnosis of influenza from Nasopharyngeal swab specimens and should not be used as a sole basis for treatment. Nasal washings and aspirates are unacceptable for Xpert Xpress SARS-CoV-2/FLU/RSV testing.  Fact Sheet for Patients: EntrepreneurPulse.com.au  Fact Sheet for Healthcare Providers: IncredibleEmployment.be  This test is not yet approved or cleared by the [place] FDA and has been authorized for detection and/or diagnosis of SARS-CoV-2 by FDA under an Emergency Use Authorization (EUA). This EUA will remain in effect (meaning this test can be used) for the duration of the COVID-19 declaration under Section 564(b)(1) of the Act, 21 U.S.C. section 360bbb-3(b)(1), unless the authorization is terminated or revoked.  Performed at [facility], [address] [place], [place] [address]           Radiology Studies: DG CHEST PORT 1 VIEW  Result Date: 12/19/2020 CLINICAL DATA:  Cough, confusion, slurred speech EXAM: PORTABLE CHEST 1 VIEW COMPARISON:  Prior chest x-ray 12/15/2020 FINDINGS: Left subclavian approach biventricular cardiac rhythm maintenance device with leads projecting over the  right atrium, right ventricle and overlying the left ventricle. Lead position remains unchanged. Stable cardiac contour. Patient is status post median sternotomy with evidence of multivessel CABG. Atherosclerotic calcifications present in the transverse aorta. The lungs are clear. No acute osseous abnormality. IMPRESSION: 1. Overall improved aeration with resolution of bibasilar atelectasis compared to 12/15/2020. 2. No acute cardiopulmonary process. 3. Ancillary findings as above. Electronically Signed   By: Jacqulynn Cadet M.D.   On: 12/19/2020 12:11   Overnight EEG with video  Result Date: 12/19/2020 Lora Havens, MD     12/19/2020  2:36 PM Patient Name: David [name]. MRN: [id number] Epilepsy Attending: Lora Havens Referring Physician/Provider: Dr Amie Portland Duration:  12/17/2020 1048 to 12/19/2020 1048  Patient history: 80 year old male with history of CVA, ICH, melanoma with mets to lung and brain who presented with aphasia.  EEG to evaluate for seizures.  Level of alertness: Awake, asleep  AEDs during EEG study: LEV, VPA  Technical aspects: This EEG study was done with scalp electrodes positioned according to the 10-20 International system of electrode placement. Electrical activity was acquired at a sampling rate of 500Hz  and reviewed with a high frequency filter of 70Hz  and a low frequency filter of 1Hz . EEG data were recorded continuously and digitally stored.  Description: The posterior dominant rhythm consists of 8 Hz activity of moderate voltage (25-35 uV) seen predominantly in posterior head regions, symmetric and reactive to eye opening and eye closing. Sleep was characterized by vertex waves, sleep spindles (12-14Hz ), maximal fronto-central region. EEG also showed continuous 3 to 5 Hz sharply contoured theta-delta slowing with overriding 15 to 18 Hz beta activity in left frontal region consistent with prior craniotomy.  19 seizures were noted arising from left centro-temporal  region during which patient was unable to speak, follow commands with delay, endorsed having metallic taste in mouth. Concomitant EEG initially showed sharply contoured 13 to 14 Hz beta activity in left central temporal region which gradually involved all of left hemisphere followed by right hemisphere and evolved in morphology to 3 to 5 Hz theta- delta slowing.  Hyperventilation and photic stimulation were not performed.    ABNORMALITY -Focal seizure, left centro- temporal region -Breach artifact, left frontal region  IMPRESSION: This study showed 19 seizures arising from left central temporal region, average duration of seizures 7-10 minutes. During the seizure patient was unable to speak, follow commands with delay, endorsed having metallic taste in mouth.EEG also showed evidence of cortical dysfunction in left frontal region consistent with prior craniotomy. Priyanka [name]        Scheduled Meds:  allopurinol  200 mg Oral Daily   amLODipine  2.5 mg Oral Daily   aspirin EC  81 mg Oral QPM   cloBAZam  10 mg Oral BID   divalproex  500 mg Oral Q8H   enoxaparin (LOVENOX) injection  40 mg Subcutaneous Q24H   levothyroxine  137 mcg Oral Q0600   metoprolol succinate  75 mg Oral Daily   midazolam  2 mg Intravenous Once   sertraline  50 mg Oral Daily   Continuous Infusions:  lacosamide (VIMPAT) IV 100 mg (12/20/20 1039)   lactated ringers 75 mL/hr at 12/20/20 0610   levETIRAcetam (KEPPRA) IVPB 750 mg (12/20/20 1114)     LOS: 2 days    Time spent: 35 minutes.     Elmarie Shiley, MD [facility]   If 7PM-7AM, please contact night-coverage [email or web address]  12/20/2020, 1:09 PM

## 2020-12-20 NOTE — Progress Notes (Signed)
Reapplied all electrodes.and wrapped patient's head.  Repositioned all electrodes due to skin breakdown (redness) from previous EEG.

## 2020-12-20 NOTE — Progress Notes (Signed)
PT Cancellation Note  Patient Details Name: David [name]. MRN: [id number] DOB: 1941/02/12   Cancelled Treatment:    Reason Eval/Treat Not Completed: Fatigue/lethargy limiting ability to participate. Attempted PT evaluation x2 this afternoon. Upon first attempt, pt would briefly open eyes and then fall back asleep. He squeezed both hands to command but could not maintain arousal to participate in PT evaluation. Upon second attempt, pt asleep sitting up with water bottle in hand and food in his mouth. Attempted to rouse pt enough to participate again, however he was unable to maintain eyes open. Encouraged him to swallow the food in his mouth. We will check back at the next available date to complete PT evaluation.    Thelma Comp 12/20/2020, 2:46 PM  Rolinda Roan, PT, DPT Acute Rehabilitation Services Pager: [phone] Office: [phone]

## 2020-12-20 NOTE — Progress Notes (Signed)
Pt is now Alert and Oriented x4. Neuro check back to pt's baseline of earlier tonight. Pt slow to respond to answers and didn't know the name Dentist but instead stated I used to work on teeth. Pt maintained eye contact during conversation. Chg Nurse Kyung Rudd RN at bedside during this encounter.

## 2020-12-20 NOTE — Progress Notes (Addendum)
Subjective:   ROS: negative except above  Examination  Vital signs in last 24 hours: Temp:  [97.7 F (36.5 C)-98.7 F (37.1 C)] 98.3 F (36.8 C) (09/12 0755) Pulse Rate:  [75-81] 81 (09/12 0755) Resp:  [18-22] 18 (09/12 0755) BP: (134-152)/(86-99) 142/95 (09/12 0755) SpO2:  [95 %-98 %] 97 % (09/12 0755)  General: lying in bed, NAD CVS: pulse-normal rate and rhythm RS: breathing comfortably, CTAB Extremities: normal, warm   Neuro: MS: Alert, oriented, follows commands CN: pupils equal and reactive, EOMI, face symmetric, tongue midline, normal sensation over face, Motor: 4+/5 strength in LUE/LLE, 4-/5 in RUE/RLE   Basic Metabolic Panel: Recent Labs  Lab 12/15/20 1206 12/15/20 1543 12/16/20 0456 12/17/20 2048 12/19/20 0308 12/20/20 0316  NA 137  --  137 136 139 139  K 4.8  --  4.6 4.8 4.7 5.1  CL 103  --  103 103 108 107  CO2 24  --  23 27 22 22   GLUCOSE 133*  --  88 87 85 90  BUN 43*  --  43* 45* 41* 46*  CREATININE 2.07* 2.02* 1.84* 1.85* 1.93* 1.97*  CALCIUM 9.3  --  9.1 9.1 9.2 9.2  MG  --   --   --   --   --  2.5*    CBC: Recent Labs  Lab 12/14/20 1129 12/15/20 1206 12/16/20 0456 12/17/20 2048 12/19/20 0308  WBC 5.1 4.9 5.3 5.7 6.7  NEUTROABS 3.6 3.7  --  4.2  --   HGB 15.6 16.3 15.5 15.6 15.5  HCT 47.2 46.5 45.2 45.7 45.4  MCV 95.5 92.1 93.6 94.8 94.2  PLT 169 168 173 165 167     Coagulation Studies: No results for input(s): LABPROT, INR in the last 72 hours.  Imaging CT head without contrast 12/15/2020: Prior left frontal craniotomy.  No acute intracranial process.   ASSESSMENT AND PLAN: 80 year old male with prior history of melanoma with mets to left frontal lobe complicated by hemorrhage in 2017 status postresection, bone mets, coronary artery disease, CKD, depression, anxiety, hypertension and hyperlipidemia who presented with progressive aphasia and confusion.  EEG showed seizures arising from left central temporal region which has been  difficult to control with escalating doses of AEDs.  Focal seizures with alteration of awareness, refractory Metastatic melanoma with brain mets -LTM EEG showed 10 seizures arising from left central temporal region, average duration of seizures 7-10 minutes. During the seizure patient was unable to speak, follow commands with delay. EEG also showed evidence of cortical dysfunction in left frontal region consistent with prior craniotomy. Patient pulled out his EEG electrodes and EEG was difficult to interpret between 0315 on 12/20/2020 to 0930 on 12/20/2020.   Recommendations -Continue Keppra 750 mg twice daily (renally adjusted), Depakote 500 mg twice daily, clobazam 10 mg twice daily and Vimpat 100 mg twice daily -As seizures have persisted, will increase Vimpat to 150 mg twice daily and load with perampanel 12 mg once -No evidence of UTI, consolidation, or any other infection. Discussed with Dr. Mickeal Skinner if there is any suspicion for carcinomatous meningitis.  Per Dr. Mickeal Skinner, patient has essentially definitively treated disease with radial necrosis only and therefore it is highly unlikely. -We will consider high-dose Solu-Medrol if seizures persist -Discussed my plan with Dr. Tyrell Antonio and RN.  Will also call and update patient's daughter -Continue seizure precautions - as needed IV Versed 2 mg for clinical seizure-like activity lasting more than 15 minutes -Management of rest of comorbidities per primary team  CRITICAL CARE Performed by: Lora Havens   Total critical care time: 40 minutes  Critical care time was exclusive of separately billable procedures and treating other patients.  Critical care was necessary to treat or prevent imminent or life-threatening deterioration.  Critical care was time spent personally by me on the following activities: development of treatment plan with patient and/or surrogate as well as nursing, discussions with consultants, evaluation of patient's  response to treatment, examination of patient, obtaining history from patient or surrogate, ordering and performing treatments and interventions, ordering and review of laboratory studies, ordering and review of radiographic studies, pulse oximetry and re-evaluation of patient's condition.   Zeb Comfort Epilepsy [facility] For questions after 5pm please refer to AMION to reach the Neurologist on call

## 2020-12-20 NOTE — Progress Notes (Addendum)
Patient as rehooked at 20.20 and mittens were placed on both hands. [facility] monitored, Event button test confirmed by [facility].   [facility] called at 2058 to notify , that patient had pulled mittens, head wrap, the glued and taped leads off. Staff reapplied most of leads, patient asleep

## 2020-12-20 NOTE — Progress Notes (Signed)
Patient has removed all but four leads from head.  EEG tech will rehook patient. Patient will likely need a barrier as this is the second time patient has removed his leads.

## 2020-12-21 ENCOUNTER — Inpatient Hospital Stay (HOSPITAL_COMMUNITY): Payer: Medicare Other

## 2020-12-21 LAB — BASIC METABOLIC PANEL
Anion gap: 7 (ref 5–15)
BUN: 42 mg/dL — ABNORMAL HIGH (ref 8–23)
CO2: 27 mmol/L (ref 22–32)
Calcium: 9.1 mg/dL (ref 8.9–10.3)
Chloride: 106 mmol/L (ref 98–111)
Creatinine, Ser: 2.07 mg/dL — ABNORMAL HIGH (ref 0.61–1.24)
GFR, Estimated: 32 mL/min — ABNORMAL LOW (ref >60)
Glucose, Bld: 93 mg/dL (ref 70–99)
Potassium: 4.5 mmol/L (ref 3.5–5.1)
Sodium: 140 mmol/L (ref 135–145)

## 2020-12-21 LAB — PML/RAR TRANSLOCATION BY PCR

## 2020-12-21 LAB — RESP PANEL BY RT-PCR (FLU A&B, COVID) ARPGX2
Influenza A by PCR: NEGATIVE
Influenza B by PCR: NEGATIVE
SARS Coronavirus 2 by RT PCR: NEGATIVE

## 2020-12-21 LAB — GLUCOSE, CAPILLARY
Glucose-Capillary: 76 mg/dL (ref 70–99)
Glucose-Capillary: 82 mg/dL (ref 70–99)

## 2020-12-21 LAB — VALPROIC ACID LEVEL: Valproic Acid Lvl: 108 ug/mL — ABNORMAL HIGH (ref 50.0–100.0)

## 2020-12-21 LAB — AMMONIA: Ammonia: 42 umol/L — ABNORMAL HIGH (ref 9–35)

## 2020-12-21 LAB — MRSA NEXT GEN BY PCR, NASAL: MRSA by PCR Next Gen: NEGATIVE — AB

## 2020-12-21 MED ORDER — TRAZODONE HCL 50 MG PO TABS
25.0000 mg | ORAL_TABLET | Freq: Every day | ORAL | Status: DC
  Administered 2020-12-21 – 2020-12-22 (×2): 25 mg via ORAL
  Filled 2020-12-21 (×3): qty 1

## 2020-12-21 MED ORDER — DIVALPROEX SODIUM 250 MG PO DR TAB
250.0000 mg | DELAYED_RELEASE_TABLET | Freq: Every day | ORAL | Status: DC
  Administered 2020-12-21: 250 mg via ORAL
  Filled 2020-12-21: qty 1

## 2020-12-21 MED ORDER — LACTATED RINGERS IV SOLN: INTRAVENOUS | Status: DC

## 2020-12-21 MED ORDER — DIVALPROEX SODIUM 250 MG PO DR TAB: 500.0000 mg | DELAYED_RELEASE_TABLET | Freq: Two times a day (BID) | ORAL | Status: DC

## 2020-12-21 MED ORDER — ALBUTEROL SULFATE (2.5 MG/3ML) 0.083% IN NEBU
2.5000 mg | INHALATION_SOLUTION | Freq: Four times a day (QID) | RESPIRATORY_TRACT | Status: DC | PRN
  Administered 2020-12-21: 2.5 mg via RESPIRATORY_TRACT
  Filled 2020-12-21: qty 3

## 2020-12-21 MED ORDER — DEXTROSE IN LACTATED RINGERS 5 % IV SOLN
INTRAVENOUS | Status: DC
  Administered 2020-12-23: 1000 mL via INTRAVENOUS

## 2020-12-21 MED ORDER — GUAIFENESIN ER 600 MG PO TB12: 600.0000 mg | ORAL_TABLET | Freq: Two times a day (BID) | ORAL | Status: DC

## 2020-12-21 MED ORDER — AZITHROMYCIN 500 MG PO TABS
500.0000 mg | ORAL_TABLET | Freq: Every day | ORAL | Status: DC
  Filled 2020-12-21: qty 1

## 2020-12-21 MED ORDER — VALPROATE SODIUM 100 MG/ML IV SOLN
500.0000 mg | Freq: Two times a day (BID) | INTRAVENOUS | Status: AC
  Administered 2020-12-21 – 2020-12-25 (×8): 500 mg via INTRAVENOUS
  Filled 2020-12-21 (×9): qty 5

## 2020-12-21 MED ORDER — SODIUM CHLORIDE 0.9 % IV SOLN
1.0000 g | INTRAVENOUS | Status: DC
  Administered 2020-12-21: 1 g via INTRAVENOUS
  Filled 2020-12-21: qty 10

## 2020-12-21 MED ORDER — SODIUM CHLORIDE 0.9 % IV SOLN
500.0000 mg | INTRAVENOUS | Status: DC
  Filled 2020-12-21: qty 500

## 2020-12-21 MED ORDER — LEVOTHYROXINE SODIUM 100 MCG/5ML IV SOLN
50.0000 ug | Freq: Every day | INTRAVENOUS | Status: DC
  Administered 2020-12-22: 50 ug via INTRAVENOUS
  Filled 2020-12-21: qty 5

## 2020-12-21 MED ORDER — LACTULOSE 10 GM/15ML PO SOLN
20.0000 g | Freq: Every day | ORAL | Status: DC
  Administered 2020-12-21 – 2020-12-23 (×3): 20 g via ORAL
  Filled 2020-12-21 (×3): qty 30

## 2020-12-21 MED ORDER — VALPROATE SODIUM 100 MG/ML IV SOLN
250.0000 mg | Freq: Every day | INTRAVENOUS | Status: AC
  Administered 2020-12-22 – 2020-12-25 (×4): 250 mg via INTRAVENOUS
  Filled 2020-12-21 (×4): qty 2.5

## 2020-12-21 NOTE — Progress Notes (Signed)
Tech monitoring EEG called and stated pt is pulling all of his EEG leads off and the doctor stated to leave them off.  Checked on pt and he removed the left hand mitt and had all of the frontal EEG leads and drsg off of head. Mitten placed back on hand

## 2020-12-21 NOTE — Procedures (Addendum)
Patient Name: David [name].  MRN: [id number]  Epilepsy Attending: Lora Havens  Referring Physician/Provider: Dr Amie Portland Duration: 12/20/2020 1048 to 12/21/2020 1016   Patient history: 80 year old male with history of CVA, ICH, melanoma with mets to lung and brain who presented with aphasia.  EEG to evaluate for seizures.   Level of alertness: Awake, asleep   AEDs during EEG study: LEV, VPA, Onfi, Vimpat, perampanel   Technical aspects: This EEG study was done with scalp electrodes positioned according to the 10-20 International system of electrode placement. Electrical activity was acquired at a sampling rate of 500Hz  and reviewed with a high frequency filter of 70Hz  and a low frequency filter of 1Hz . EEG data were recorded continuously and digitally stored.    Description: The posterior dominant rhythm consists of 8 Hz activity of moderate voltage (25-35 uV) seen predominantly in posterior head regions, symmetric and reactive to eye opening and eye closing. Sleep was characterized by vertex waves, sleep spindles (12-14Hz ), maximal fronto-central region. EEG also showed continuous 3 to 5 Hz sharply contoured theta-delta slowing with overriding 15 to 18 Hz beta activity in left frontal region consistent with prior craniotomy.    Four seizures were noted arising from left centro-temporal region during which patient was unable to speak, follow commands with delay. Concomitant EEG initially showed sharply contoured 13 to 14 Hz beta activity in left central temporal region which gradually involved all of left hemisphere followed by right hemisphere and evolved in morphology to 3 to 5 Hz theta- delta slowing. Average duration of seizures 3-8 minutes.     Patient pulled out his EEG electrodes and EEG was difficult to interpret between 1810 on 12/20/2020 to 2010 on 12/20/2020. Leads were reapplied by EEG tech but patient again pulled some leads around 0430 on 12/21/2020  to 0935    ABNORMALITY -Focal seizure, left centro- temporal region -Breach artifact, left frontal region   IMPRESSION: This study showed four seizures arising from left central temporal region, average duration of seizures 3-8 minutes. During the seizure patient was unable to speak, follow commands with delay. EEG also showed evidence of cortical dysfunction in left frontal region consistent with prior craniotomy.   Patient pulled out his EEG electrodes and EEG was difficult to interpret between 1810 on 12/20/2020 to 2010 on 12/20/2020. Leads were reapplied by EEG tech but patient again pulled some leads around 0430 on 12/21/2020  to [place]

## 2020-12-21 NOTE — Progress Notes (Addendum)
PROGRESS NOTE    David [name].  XAJ:[id number] DOB: 01-03-1941 DOA: 12/17/2020 PCP: [name], MD   Brief Narrative: 80 year old with PMH significant for benign familia hematuria, BPH, CAD, N-STEMI, Hypothyroidism, sleep apnea on CPAP, CML, melanoma metastatic to lung and brain, hemorrhagic CVA with aphasia in 2017 who was admitted 12/15/2020 to [facility] due to aphasia, speech arrest, found to have seizures originating from left central temporal region. He was evaluated by neurologist. He was started on Depakote, keppra dose increase, follow by discontinuation of medications that lower seizure threshold (ritalin, trazodone). Patient was transfer to [facility] for  LTM EEG.    Assessment & Plan:   Principal Problem:   Focal seizures (Gregory) Active Problems:   ICH (intracerebral hemorrhage) (HCC) - L frontal due to HTN vs CAA   Benign essential HTN   OSA on CPAP   Hemiplegia and hemiparesis following nontraumatic intracerebral hemorrhage affecting right dominant side (HCC)   Chronic kidney disease, stage III (moderate) (HCC)   Metastatic melanoma to lung [facility])   Acquired hypothyroidism   Hyperlipidemia  1-Focal Seizure, refractory -Patient presented with aphasia,speech arrest, confusion.  -Appreciate neurologist  assistance.  -Continue with  Keppra 750 mg BID.  -Depakote dose will be reduce to 250 mg and 500 mg at hs. Due to high Depakote level.  -Continue with Clobazam, Dose change to BID 9/11. -Plan to hold continuous EEG today, might resume in 1 or 2 days.   -CT head this admission with no acute intracranial process. -No electrolytes abnormalities. No evidence of infection.  -Started on Vimpat 9/11.  Received one dose of perampanel 9/12.  Plan to continue with current meds.   2-Acute Metabolic encephalopathy//  -Patient has been more sleepy over last two days, probably multifactorial related to hospital delirium, Medications, recurrent focal seizure episodes. -Ammonia  level elevated, likely related to Depakote. Plan to start low dose lactulose.  -He is notice to have this afternoon, worsening right side weakness. After further discussion with Dr Hortense Ramal plan to proceed with CT head. CT head was neagtive for acute intracranial abnormalities.  -Delirium precaution.  -Plan to resume trazodone today to preserve sleep.  -Discussed with Dr Hortense Ramal, ok to resume trazodone to help with sleep.   Addendum:  Fever:  Patient spike fever (102) today at 4 Pm. -Plan to pan-culture.  -Blood culture, UA, Urine culture, MRSA PCR, repeat Covid test. -Portable Chest x ray.  -I am concern with PNA. Will start empirically Ceftriaxone and Azithromycin. Follow up MRSA PCR, would avoid vancomycin at this time due to CKD.  Sputum culture  -Addendum: 6PM Patient more lethargic,Tempeture 102 still . Plan for tylenol suppository now. Will make him NPO. Dr Hortense Ramal has change most of the meds to IV for seizure. He has received most of his morning meds. I will change most meds to IV>   Essential Hypertension: Continue with amlodipine and metoprolol.  History of nontraumatic ICH, HO hemiplegia.  Prior history of hemiplegia and hemiparesis. OT consulted.  Metastatic melanoma to lung and brain: Status post left frontal craniotomy for metastatic melanoma resection. Follow with Dr. Mickeal Skinner as an outpatient.  Hypothyroidism: Continue with Synthroid.  AKI on CKD stage IIIa: Creatinine range 1.6 to 1.8. Cr peak to 2.2 this admission.  Cr increase to 2, plan to resume IV fluids.  Repeat labs tomorrow.   Hyperlipidemia: Continue with rosuvastatin. Vitamin B-12; 383. MMA Normal 297. Continue with supplement.   Cough //Wheezing Incentive spirometry.  Repeated chest x ray:  improved aeration with resolution of bibasilar atelectasis compared 12/15/2020. No leukocytosis, Afebrile.  Transiently had some tachypnea, he has BL wheezing. Oxygen Sat 98 % RA. Plan to proceed with PRN Nebulizer.    Major Depressive Disorder:  Continue Sertraline.  Trazodone on hold by neurology./   OSA, on CPAP. Resume CPAP  HO of complete AV block S/P Pacemaker.   Estimated body mass index is 30.41 kg/m as calculated from the following:   Height as of 12/15/20: 5\' 8"  (1.727 m).   Weight as of 12/15/20: 90.7 kg.   DVT prophylaxis: Start Lovenox for DVT prophylaxis, discussed with neurology. Code Status: DNR Family Communication: Daughter at bedside.  Disposition Plan:  Status is: Inpatient  Remains inpatient appropriate because:IV treatments appropriate due to intensity of illness or inability to take PO  Dispo: The patient is from: Home              Anticipated d/c is to: Home              Patient currently is not medically stable to d/c.   Difficult to place patient No        Consultants:  Neurology  Procedures:  LTM-EEG  Antimicrobials:    Subjective: I saw patient this morning, he was sleepy but he would open eyes, answer few questions. Said he was not feeling well. Report some cough, denies dyspnea.   I saw him again in the afternoon around 1;45, he was sleepy, lethargic, answer questions but was notice to have more right side weakness, this might be because he is sleepy. Daughter also notice that he has been using his left hand to eat when he has always been dominant right hand. He was holding food in his mouth. He had BL LE weakness. Cbg initially 76---subsequently 80  He was wheezing and was mildly tachypnea, . His vitals were stable, Oxygen sat 98 % RA.   Objective: Vitals:   12/20/20 2027 12/21/20 0002 12/21/20 0407 12/21/20 0903  BP: 122/86 127/87 (!) 111/95 138/83  Pulse: 75 75 75 77  Resp: (!) 22 18 (!) 22 20  Temp: 97.8 F (36.6 C)  98.8 F (37.1 C) 98.5 F (36.9 C)  TempSrc: Oral  Oral Axillary  SpO2: 100% 100% 100% 100%   No intake or output data in the 24 hours ending 12/21/20 0922  There were no vitals filed for this  visit.  Examination:  General exam: NAD, sleepy  Respiratory system: BL wheezing, ronchus./  Cardiovascular system: S 1, S 2 RRR Gastrointestinal system:  BS present, soft, nt  Central nervous system: sleepy, follows some command, right side weakness. BL LE weakness.  Extremities: No edema.    Data Reviewed: I have personally reviewed following labs and imaging studies  CBC: Recent Labs  Lab 12/14/20 1129 12/15/20 1206 12/16/20 0456 12/17/20 2048 12/19/20 0308  WBC 5.1 4.9 5.3 5.7 6.7  NEUTROABS 3.6 3.7  --  4.2  --   HGB 15.6 16.3 15.5 15.6 15.5  HCT 47.2 46.5 45.2 45.7 45.4  MCV 95.5 92.1 93.6 94.8 94.2  PLT 169 168 173 165 326    Basic Metabolic Panel: Recent Labs  Lab 12/16/20 0456 12/17/20 2048 12/19/20 0308 12/20/20 0316 12/21/20 0643  NA 137 136 139 139 140  K 4.6 4.8 4.7 5.1 4.5  CL 103 103 108 107 106  CO2 23 27 22 22 27   GLUCOSE 88 87 85 90 93  BUN 43* 45* 41* 46* 42*  CREATININE 1.84* 1.85*  1.93* 1.97* 2.07*  CALCIUM 9.1 9.1 9.2 9.2 9.1  MG  --   --   --  2.5*  --     GFR: Estimated Creatinine Clearance: 30.6 mL/min (A) (by C-G formula based on SCr of 2.07 mg/dL (H)). Liver Function Tests: Recent Labs  Lab 12/14/20 1129 12/15/20 1206 12/17/20 2048  AST 25 25 17   ALT 14 14 12   ALKPHOS 54 51 48  BILITOT 0.6 0.7 0.6  PROT 7.3 6.9 6.5  ALBUMIN 4.2 4.3 3.7    No results for input(s): LIPASE, AMYLASE in the last 168 hours. Recent Labs  Lab 12/15/20 1543 12/17/20 2048  AMMONIA 17 18    Coagulation Profile: Recent Labs  Lab 12/15/20 1206  INR 1.0    Cardiac Enzymes: No results for input(s): CKTOTAL, CKMB, CKMBINDEX, TROPONINI in the last 168 hours. BNP (last 3 results) No results for input(s): PROBNP in the last 8760 hours. HbA1C: No results for input(s): HGBA1C in the last 72 hours. CBG: No results for input(s): GLUCAP in the last 168 hours. Lipid Profile: No results for input(s): CHOL, HDL, LDLCALC, TRIG, CHOLHDL, LDLDIRECT  in the last 72 hours. Thyroid Function Tests: No results for input(s): TSH, T4TOTAL, FREET4, T3FREE, THYROIDAB in the last 72 hours. Anemia Panel: No results for input(s): VITAMINB12, FOLATE, FERRITIN, TIBC, IRON, RETICCTPCT in the last 72 hours. Sepsis Labs: No results for input(s): PROCALCITON, LATICACIDVEN in the last 168 hours.  Recent Results (from the past 240 hour(s))  Resp Panel by RT-PCR (Flu A&B, Covid) Nasopharyngeal Swab     Status: None   Collection Time: 12/15/20  1:03 PM   Specimen: Nasopharyngeal Swab; Nasopharyngeal(NP) swabs in vial transport medium  Result Value Ref Range Status   SARS Coronavirus 2 by RT PCR NEGATIVE NEGATIVE Final    Comment: (NOTE) SARS-CoV-2 target nucleic acids are NOT DETECTED.  The SARS-CoV-2 RNA is generally detectable in upper respiratory specimens during the acute phase of infection. The lowest concentration of SARS-CoV-2 viral copies this assay can detect is 138 copies/mL. A negative result does not preclude SARS-Cov-2 infection and should not be used as the sole basis for treatment or other patient management decisions. A negative result may occur with  improper specimen collection/handling, submission of specimen other than nasopharyngeal swab, presence of viral mutation(s) within the areas targeted by this assay, and inadequate number of viral copies(<138 copies/mL). A negative result must be combined with clinical observations, patient history, and epidemiological information. The expected result is Negative.  Fact Sheet for Patients:  EntrepreneurPulse.com.au  Fact Sheet for Healthcare Providers:  IncredibleEmployment.be  This test is no t yet approved or cleared by the [place] FDA and  has been authorized for detection and/or diagnosis of SARS-CoV-2 by FDA under an Emergency Use Authorization (EUA). This EUA will remain  in effect (meaning this test can be used) for the duration of  the COVID-19 declaration under Section 564(b)(1) of the Act, 21 U.S.C.section 360bbb-3(b)(1), unless the authorization is terminated  or revoked sooner.       Influenza A by PCR NEGATIVE NEGATIVE Final   Influenza B by PCR NEGATIVE NEGATIVE Final    Comment: (NOTE) The Xpert Xpress SARS-CoV-2/FLU/RSV plus assay is intended as an aid in the diagnosis of influenza from Nasopharyngeal swab specimens and should not be used as a sole basis for treatment. Nasal washings and aspirates are unacceptable for Xpert Xpress SARS-CoV-2/FLU/RSV testing.  Fact Sheet for Patients: EntrepreneurPulse.com.au  Fact Sheet for Healthcare Providers: IncredibleEmployment.be  This  test is not yet approved or cleared by the [place] and has been authorized for detection and/or diagnosis of SARS-CoV-2 by FDA under an Emergency Use Authorization (EUA). This EUA will remain in effect (meaning this test can be used) for the duration of the COVID-19 declaration under Section 564(b)(1) of the Act, 21 U.S.C. section 360bbb-3(b)(1), unless the authorization is terminated or revoked.  Performed at [facility], [address] [place], [place] [address]           Radiology Studies: DG CHEST PORT 1 VIEW  Result Date: 12/19/2020 CLINICAL DATA:  Cough, confusion, slurred speech EXAM: PORTABLE CHEST 1 VIEW COMPARISON:  Prior chest x-ray 12/15/2020 FINDINGS: Left subclavian approach biventricular cardiac rhythm maintenance device with leads projecting over the right atrium, right ventricle and overlying the left ventricle. Lead position remains unchanged. Stable cardiac contour. Patient is status post median sternotomy with evidence of multivessel CABG. Atherosclerotic calcifications present in the transverse aorta. The lungs are clear. No acute osseous abnormality. IMPRESSION: 1. Overall improved aeration with resolution of bibasilar atelectasis compared to  12/15/2020. 2. No acute cardiopulmonary process. 3. Ancillary findings as above. Electronically Signed   By: Jacqulynn Cadet M.D.   On: 12/19/2020 12:11        Scheduled Meds:  allopurinol  200 mg Oral Daily   amLODipine  2.5 mg Oral Daily   aspirin EC  81 mg Oral QPM   cloBAZam  10 mg Oral BID   divalproex  500 mg Oral Q8H   enoxaparin (LOVENOX) injection  40 mg Subcutaneous Q24H   levothyroxine  137 mcg Oral Q0600   metoprolol succinate  75 mg Oral Daily   sertraline  50 mg Oral Daily   Continuous Infusions:  lacosamide (VIMPAT) IV 150 mg (12/20/20 2127)   lactated ringers     levETIRAcetam (KEPPRA) IVPB 750 mg (12/20/20 2218)     LOS: 3 days    Time spent: 35 minutes.     Elmarie Shiley, MD [facility]   If 7PM-7AM, please contact night-coverage [email or web address]  12/21/2020, 9:22 AM

## 2020-12-21 NOTE — Progress Notes (Signed)
OT Cancellation Note  Patient Details Name: David [name]. MRN: [id number] DOB: 1940/09/06   Cancelled Treatment:    Reason Eval/Treat Not Completed: Patient not medically ready Pt currently with change of status, RUE weakness and RLE weakness. RN and MD in room to assess. Will return later as time allows and pt is appropriate.   [facility] OTR/L Acute Rehabilitation Services Office: [place] 12/21/2020, 1:19 PM

## 2020-12-21 NOTE — Progress Notes (Addendum)
Subjective: Patient unfortunately pulled off his electrodes twice overnight.  This morning, he reports cough and states he is not feeling well but unable to elaborate.  ROS: negative except above  Examination  Vital signs in last 24 hours: Temp:  [97.6 F (36.4 C)-98.8 F (37.1 C)] 98.5 F (36.9 C) (09/13 0903) Pulse Rate:  [74-77] 77 (09/13 0903) Resp:  [16-22] 20 (09/13 0903) BP: (111-138)/(79-95) 138/83 (09/13 0903) SpO2:  [94 %-100 %] 100 % (09/13 0903)  General: lying in bed, NAD CVS: pulse-normal rate and rhythm RS: breathing comfortably, CTAB Extremities: normal, warm    Neuro: MS: Alert, oriented, follows commands CN: pupils equal and reactive, EOMI, face symmetric, tongue midline, normal sensation over face, Motor: 4+/5 strength in LUE/LLE, 4-/5 in RUE/RLE  Basic Metabolic Panel: Recent Labs  Lab 12/16/20 0456 12/17/20 2048 12/19/20 0308 12/20/20 0316 12/21/20 0643  NA 137 136 139 139 140  K 4.6 4.8 4.7 5.1 4.5  CL 103 103 108 107 106  CO2 23 27 22 22 27   GLUCOSE 88 87 85 90 93  BUN 43* 45* 41* 46* 42*  CREATININE 1.84* 1.85* 1.93* 1.97* 2.07*  CALCIUM 9.1 9.1 9.2 9.2 9.1  MG  --   --   --  2.5*  --     CBC: Recent Labs  Lab 12/14/20 1129 12/15/20 1206 12/16/20 0456 12/17/20 2048 12/19/20 0308  WBC 5.1 4.9 5.3 5.7 6.7  NEUTROABS 3.6 3.7  --  4.2  --   HGB 15.6 16.3 15.5 15.6 15.5  HCT 47.2 46.5 45.2 45.7 45.4  MCV 95.5 92.1 93.6 94.8 94.2  PLT 169 168 173 165 167     Coagulation Studies: No results for input(s): LABPROT, INR in the last 72 hours.  Imaging No new brain imaging overnight  ASSESSMENT AND PLAN: 80 year old male with prior history of melanoma with mets to left frontal lobe complicated by hemorrhage in 2017 status postresection, bone mets, coronary artery disease, CKD, depression, anxiety, hypertension and hyperlipidemia who presented with progressive aphasia and confusion.  EEG showed seizures arising from left central temporal  region which has been difficult to control with escalating doses of AEDs.   Focal seizures with alteration of awareness, refractory Metastatic melanoma with brain mets Delirium - LTM EEG showed four seizures arising from left central temporal region, average duration of seizures 3-8 minutes. During the seizure patient was unable to speak, follow commands with delay. EEG also showed evidence of cortical dysfunction in left frontal region consistent with prior craniotomy.   Recommendations -Continue Keppra 750 mg twice daily (renally adjusted), Depakote 500 mg twice daily, clobazam 10 mg twice daily and Vimpat 150 mg twice daily -We will check Depakote level and ammonia as patient has been confused overnight. -Patient's seizure frequency has been improving.  He is also had some skin breakdown on the scalp.  Therefore, we will discontinue LTM.  We will likely repeat LTM EEG in a day or two to assess for seizure frequency -Encourage PT/OT -Continue seizure precautions -We will also start delirium precautions  - as needed IV Versed 2 mg for clinical seizure-like activity lasting more than 15 minutes -Management of rest of comorbidities per primary team -Discussed my plan with Dr. Tyrell Antonio.    ADDENDUM -Hyperammonemia due to depakote use -We will reduce Depakote to 500 mg every morning and nightly, 250 mg in the afternoon. -Updated patient's daughter at bedside  ADDENDUM AT 1340  Dr. Tyrell Antonio called me because patient had right upper and lower  extremity weakness, excessive drowsiness.  On my exam, patient wakes up to verbal stimulation, able to tell me his name, able to follow commands, 3/5 strength in right upper extremity (distal more than proximal), minimal antigravity strength in right lower extremity, 4/5 in left upper extremity, 3/5 in left lower extremity.    Patient's blood pressure was normotensive with blood glucose of around 70.  He had just finished his lunch and received Keppra 70  mg IV.  Patient's exam is slightly worse than his exam earlier this morning in terms of motor strength in right upper and lower extremity.  However, this is most likely due to excessive drowsiness (due to recent administration of Keppra).  It is unlikely that patient is having an acute stroke with exact symptoms that he has had before.  However, will obtain CT head without contrast to assess for any acute intracranial change.  CRITICAL CARE Performed by: Lora Havens   Total critical care time: 65 minutes  Critical care time was exclusive of separately billable procedures and treating other patients.  Critical care was necessary to treat or prevent imminent or life-threatening deterioration.  Critical care was time spent personally by me on the following activities: development of treatment plan with patient and/or surrogate as well as nursing, discussions with consultants, evaluation of patient's response to treatment, examination of patient, obtaining history from patient or surrogate, ordering and performing treatments and interventions, ordering and review of laboratory studies, ordering and review of radiographic studies, pulse oximetry and re-evaluation of patient's condition.

## 2020-12-21 NOTE — Progress Notes (Signed)
Pt temp 102.7F axillary. MD aware. IV abx, urine culture, MRSA swab, respi culture, Covid & flu swabs/cultures ordered. No urine or phlegm at this time. Pt loss his IV access, awaiting on VAST team.

## 2020-12-21 NOTE — Care Management Important Message (Signed)
Important Message  Patient Details  Name: David [name]. MRN: [id number] Date of Birth: [birth year]   Medicare Important Message Given:  Yes     Rand Etchison 12/21/2020, 1:11 PM

## 2020-12-21 NOTE — Progress Notes (Signed)
PT Cancellation Note  Patient Details Name: David [name]. MRN: [id number] DOB: Jul 26, 1940   Cancelled Treatment:    Reason Eval/Treat Not Completed: Medical issues which prohibited therapy. Family member present with concerns for new R sided weakness. MD and RN present to assess at bedside. Will continue to follow and initiate PT evaluation when able.    Thelma Comp 12/21/2020, 1:52 PM  Rolinda Roan, PT, DPT Acute Rehabilitation Services Pager: [phone] Office: [phone]

## 2020-12-21 NOTE — Progress Notes (Signed)
Placed patient on CPAP via nasal mask for HS use. 10.0 cm H2) with 2L O2 bleed in. RN aware.

## 2020-12-21 NOTE — Care Plan (Signed)
Patient spiked a fever and is on antibiotics. Also lethargic so likely wont be able to take his AEDs PO. Will switch VPA to IV. Will try to give Onfi crushed with apple sauce but if unable to, OK to hold onfi tonight as it has a long half life and he did receive the dose this morning. If by tomorrow morning he remains lethargic, I can transition to IV phenobarb instead of Onfi.   Plan communicated with Dr Tyrell Antonio and briefly with patient's daughter via message.   Danene Montijo [name]

## 2020-12-22 ENCOUNTER — Inpatient Hospital Stay (HOSPITAL_COMMUNITY): Payer: Medicare Other

## 2020-12-22 DIAGNOSIS — R509 Fever, unspecified: Secondary | ICD-10-CM

## 2020-12-22 DIAGNOSIS — I10 Essential (primary) hypertension: Secondary | ICD-10-CM

## 2020-12-22 DIAGNOSIS — E039 Hypothyroidism, unspecified: Secondary | ICD-10-CM

## 2020-12-22 LAB — CBC
HCT: 54 % — ABNORMAL HIGH (ref 39.0–52.0)
Hemoglobin: 17.6 g/dL — ABNORMAL HIGH (ref 13.0–17.0)
MCH: 31.3 pg (ref 26.0–34.0)
MCHC: 32.6 g/dL (ref 30.0–36.0)
MCV: 96.1 fL (ref 80.0–100.0)
Platelets: UNDETERMINED 10*3/uL (ref 150–400)
RBC: 5.62 MIL/uL (ref 4.22–5.81)
RDW: 14 % (ref 11.5–15.5)
WBC: 4.5 10*3/uL (ref 4.0–10.5)
nRBC: 0 % (ref 0.0–0.2)

## 2020-12-22 LAB — URINALYSIS, ROUTINE W REFLEX MICROSCOPIC
Bilirubin Urine: NEGATIVE
Glucose, UA: NEGATIVE mg/dL
Ketones, ur: NEGATIVE mg/dL
Leukocytes,Ua: NEGATIVE
Nitrite: NEGATIVE
Protein, ur: 100 mg/dL — AB
Specific Gravity, Urine: 1.02 (ref 1.005–1.030)
pH: 6 (ref 5.0–8.0)

## 2020-12-22 LAB — BASIC METABOLIC PANEL
Anion gap: 17 — ABNORMAL HIGH (ref 5–15)
BUN: 47 mg/dL — ABNORMAL HIGH (ref 8–23)
CO2: 23 mmol/L (ref 22–32)
Calcium: 9.3 mg/dL (ref 8.9–10.3)
Chloride: 103 mmol/L (ref 98–111)
Creatinine, Ser: 2.02 mg/dL — ABNORMAL HIGH (ref 0.61–1.24)
GFR, Estimated: 33 mL/min — ABNORMAL LOW (ref >60)
Glucose, Bld: 116 mg/dL — ABNORMAL HIGH (ref 70–99)
Potassium: 5.3 mmol/L — ABNORMAL HIGH (ref 3.5–5.1)
Sodium: 143 mmol/L (ref 135–145)

## 2020-12-22 LAB — URINALYSIS, MICROSCOPIC (REFLEX)

## 2020-12-22 MED ORDER — AMPICILLIN-SULBACTAM SODIUM 1.5 (1-0.5) G IJ SOLR
1.5000 g | Freq: Three times a day (TID) | INTRAMUSCULAR | Status: DC
Start: 2020-12-22 — End: 2020-12-24
  Administered 2020-12-22 – 2020-12-24 (×6): 1.5 g via INTRAVENOUS
  Filled 2020-12-22 (×9): qty 4

## 2020-12-22 MED ORDER — LEVOTHYROXINE SODIUM 25 MCG PO TABS
137.0000 ug | ORAL_TABLET | Freq: Every day | ORAL | Status: DC
  Administered 2020-12-23: 137 ug via ORAL
  Filled 2020-12-22: qty 1

## 2020-12-22 MED ORDER — SODIUM ZIRCONIUM CYCLOSILICATE 5 G PO PACK
5.0000 g | PACK | Freq: Once | ORAL | Status: AC
  Administered 2020-12-22: 5 g via ORAL
  Filled 2020-12-22: qty 1

## 2020-12-22 NOTE — Evaluation (Signed)
Clinical/Bedside Swallow Evaluation Patient Details  Name: David [name]. MRN: [id number] Date of Birth: [birth year]  Today's Date: 12/22/2020 Time: SLP Start Time (ACUTE ONLY): 24 SLP Stop Time (ACUTE ONLY): 1010 SLP Time Calculation (min) (ACUTE ONLY): 25 min  Past Medical History:  Past Medical History:  Diagnosis Date   Anginal pain ([place])    Arthritis    mostly hands   Benign familial hematuria    BPH (benign prostatic hypertrophy)    Brain cancer (HCC)    melanoma with brain met   Coronary artery disease    GERD (gastroesophageal reflux disease)    High cholesterol    Hypertension    Hypothyroidism    Intracerebral hemorrhage (HCC)    Melanoma (HCC)    Metastatic melanoma to lung (HCC)    Mood disorder ([place])    Myocardial infarction [facility])    Pacemaker    due to syncope, 3rd degree HB (upgrade to [employer] CRT-P 02/25/13 (Dr. Uvaldo Rising)   Rectal bleed    due to NSAIDS   Renal disorder    Sepsis ([place]) 12/09/2018   Skin cancer    melanoma   Sleep apnea    uses CPAP   Steroid-induced hyperglycemia    Stroke [facility])    Past Surgical History:  Past Surgical History:  Procedure Laterality Date   APPLICATION OF CRANIAL NAVIGATION N/A 10/15/2015   Procedure: APPLICATION OF CRANIAL NAVIGATION;  Surgeon: Erline Levine, MD;  Location: [place] [facility];  Service: Neurosurgery;  Laterality: N/A;   BIV PACEMAKER GENERATOR CHANGEOUT N/A 04/02/2019   Procedure: BIV PACEMAKER GENERATOR CHANGEOUT;  Surgeon: Evans Lance, MD;  Location: [place] CV LAB;  Service: Cardiovascular;  Laterality: N/A;   CARDIAC CATHETERIZATION  2013   CARDIAC SURGERY     bypass X 2   COLONOSCOPY WITH PROPOFOL N/A 05/08/2016   diverticulosis, int hem, no f/u needed [name])   CORONARY ARTERY BYPASS GRAFT  2013   LIMA-LAD, SVG-PDA 03/27/11 (Dr. Francee Gentile)   [place] N/A 10/15/2015   Procedure: LEFT FRONTAL CRANIOTOMY TUMOR EXCISION with Curve;  Surgeon: Erline Levine, MD;  Location: [place] [facility];   Service: Neurosurgery;  Laterality: N/A;  CRANIOTOMY TUMOR EXCISION   EYE SURGERY     FOOT SURGERY  08/2010   IR REMOVAL TUN ACCESS W/ PORT W/O FL MOD SED  08/06/2020   JOINT REPLACEMENT Left    partial knee   KNEE ARTHROSCOPY  12/2008   LYMPH NODE BIOPSY     PACEMAKER INSERTION     TONSILLECTOMY     HPI:  80yo male admitted 01/01/2021 with aphasia, seizures PMH: melanoma with mets to lung and left frontal lobe, ICH (2017), APML, HTN, hypercholesterolemia, OSA, CAD/CABG (2012), AV node dysfunction, pacemaker. CXR = no active disease   Assessment / Plan / Recommendation Clinical Impression  Pt was awake but held his head looking down unless cued to raise his head up. Pt had difficulty following commands for CN exam, however, no obvious asymmetry was noted. Generalized weakness was present, however, speech was intelligible. Pt accepted trials of ice chips, thin liquid, puree, and solid textures. Timely oral prep and clearing noted, with no overt s/s aspiration on any consistency. Pt's risk for aspiration increases with lethargy or post-ictal state, but no evidence of aspiration was noted during this assessment. Recommend dys 3/mechanical soft diet and thin liquids for energy conservation. SLP will continue to follow to assess diet tolerance and determine if instrumental assessment is needed.  SLP Visit Diagnosis:  Dysphagia, unspecified (R13.10)    Aspiration Risk  Moderate aspiration risk    Diet Recommendation Dysphagia 3 (Mech soft);Thin liquid   Liquid Administration via: Cup;Straw Medication Administration: Whole meds with liquid Supervision: Patient able to self feed Compensations: Minimize environmental distractions;Slow rate;Small sips/bites Postural Changes: Seated upright at 90 degrees;Remain upright for at least 30 minutes after po intake    Other  Recommendations Oral Care Recommendations: Oral care BID   Follow up Recommendations Other (comment) (TBD)      Frequency and  Duration min 1 x/week  1 week;2 weeks       Prognosis Prognosis for Safe Diet Advancement: Fair      Swallow Study   General Date of Onset: 12/20/2020 HPI: 80yo male admitted 12/09/2020 with aphasia, seizures PMH: melanoma with mets to lung and left frontal lobe, ICH (2017), APML, HTN, hypercholesterolemia, OSA, CAD/CABG (2012), AV node dysfunction, pacemaker. CXR = no active disease Type of Study: Bedside Swallow Evaluation Previous Swallow Assessment: BSE 11/2018 - reg/thin Diet Prior to this Study: Regular;Thin liquids History of Recent Intubation: No Behavior/Cognition: Lethargic/Drowsy;Cooperative Oral Cavity Assessment: Within Functional Limits Oral Care Completed by SLP: No Oral Cavity - Dentition: Adequate natural dentition Self-Feeding Abilities: Total assist Patient Positioning: Upright in bed Baseline Vocal Quality: Low vocal intensity Volitional Cough: Cognitively unable to elicit Volitional Swallow: Unable to elicit    Oral/Motor/Sensory Function Overall Oral Motor/Sensory Function: Generalized oral weakness   Ice Chips Ice chips: Within functional limits Presentation: Spoon   Thin Liquid Thin Liquid: Within functional limits Presentation: Straw    Nectar Thick Nectar Thick Liquid: Not tested   Honey Thick Honey Thick Liquid: Not tested   Puree Puree: Within functional limits Presentation: Spoon   Solid     Solid: Within functional limits     David [name], [facility], [place] Speech Language Pathologist Office: [phone]  Shonna Chock 12/22/2020,10:13 AM

## 2020-12-22 NOTE — Care Plan (Signed)
LTM EEG reviewed till 1435.  No seizures noted.  Please review final report for details.  David [name] [name]

## 2020-12-22 NOTE — Progress Notes (Signed)
Occupational Therapy Treatment Patient Details Name: David [name]. MRN: [id number] DOB: May 03, 1940 Today's Date: 12/22/2020   History of present illness 80 y.o. maled presented to ER 12/25/2020 secondary to AMS, word-finding difficulties; admitted for TIA/CVA work up.  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  Per neurology, patient noted with active seizure activity on EEG, correlating with episodes of speech difficulties. Keppra initiated (and planning to increase dosage). 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   OT comments  Pt received in bed with EEG monitor connected. Pt significantly limited this session secondary to lethargy.  Briefly keeping eyes open and following commands <10% of the time. Pt requiring totalA+2 for all bed mobility and up to maximum assistance for balance while sitting EOB. Pt will continue to benefit from skilled OT services to maximize safety and independence with ADL/IADL and functional mobility. Will continue to follow acutely and progress as tolerated.     Recommendations for follow up therapy are one component of a multi-disciplinary discharge planning process, led by the attending physician.  Recommendations may be updated based on patient status, additional functional criteria and insurance authorization.    Follow Up Recommendations  CIR;Supervision/Assistance - 24 hour    Equipment Recommendations  None recommended by OT    Recommendations for Other Services      Precautions / Restrictions Precautions Precautions: Fall Precaution Comments: seziure precautions Restrictions Weight Bearing Restrictions: No       Mobility Bed Mobility Overal bed  mobility: Needs Assistance Bed Mobility: Supine to Sit;Sit to Supine Rolling: Total assist;+2 for physical assistance   Supine to sit: Total assist;+2 for physical assistance;+2 for safety/equipment;HOB elevated Sit to supine: Total assist;+2 for physical assistance;+2 for safety/equipment;HOB elevated   General bed mobility comments: two person total assist to roll for pericare. totalA+2 for BLE and trunk management during supine<>sit    Transfers                 General transfer comment: unable to support himself in sitting, so did not attempt    Balance Overall balance assessment: Needs assistance Sitting-balance support: Single extremity supported;Feet supported Sitting balance-Leahy Scale: Zero Sitting balance - Comments: up to maximum assistance sitting EOB; poor trunk flexion and poor righting reaction noted this session. while sitting EOB attempted command followign, trunk extension to upright and midline posture, but pt quickly fatigued Postural control: Posterior lean;Left lateral lean                                 ADL either performed or assessed with clinical judgement   ADL Overall ADL's : Needs assistance/impaired                                       General ADL Comments: at this time pt requiring totalA for all ADL/IADL     Vision       Perception     Praxis      Cognition Arousal/Alertness: Lethargic Behavior During Therapy: Flat affect Overall Cognitive Status: Difficult to assess Area of Impairment: Orientation;Attention;Following commands;Memory;Safety/judgement;Problem solving;Awareness  Orientation Level: Disoriented to;Place;Time;Situation Current Attention Level: Focused Memory: Decreased short-term memory Following Commands: Follows one step commands inconsistently;Follows one step commands with increased time Safety/Judgement: Decreased awareness of safety;Decreased awareness of  deficits Awareness: Intellectual Problem Solving: Slow processing;Decreased initiation;Difficulty sequencing;Requires verbal cues;Requires tactile cues General Comments: pt very lethargic during session, alerted to his name with increased time and followed one step commands <10% of the time. Pt stated his name and his sister's name. Pt unable to state birth month        Exercises     Shoulder Instructions       General Comments EEG wires remained intact    Pertinent Vitals/ Pain       Pain Assessment: Faces Faces Pain Scale: Hurts little more Pain Location: RLE with movement appears to be in his knee Pain Descriptors / Indicators: Grimacing;Guarding Pain Intervention(s): Limited activity within patient's tolerance;Monitored during session  Home Living Family/patient expects to be discharged to:: Private residence Living Arrangements: Spouse/significant other Available Help at Discharge: Family;Personal care attendant Type of Home: House Home Access: Stairs to enter Technical brewer of Steps: 2 Entrance Stairs-Rails: Left Home Layout: One level     Bathroom Shower/Tub: Occupational psychologist: Handicapped height     Home Equipment: Cane - quad   Additional Comments: PCA is with them 4 hours a day 7 days a week (pulled from chart review)      Prior Functioning/Environment Level of Independence: Independent with assistive device(s)        Comments: Mod indep with ADLs, household and limited community mobilization; intermittent use of quad cane (esp if outside of the home).  Endorses single fall in previous year.  Has aide that comes 4 hours/day to assist with household chores and self-care as needed. (pulled from chart review)   Frequency  Min 2X/week        Progress Toward Goals  OT Goals(current goals can now be found in the care plan section)  Progress towards OT goals: Not progressing toward goals - comment (limited by lethargy)  Acute  Rehab OT Goals Patient Stated Goal: pt did not state OT Goal Formulation: With patient Time For Goal Achievement: 01/02/21 Potential to Achieve Goals: Fair ADL Goals Pt Will Perform Grooming: Independently;standing Pt Will Perform Upper Body Bathing: Independently;standing;sitting Pt Will Perform Lower Body Bathing: Independently;sit to/from stand Pt Will Perform Upper Body Dressing: Independently;sitting;standing Pt Will Perform Lower Body Dressing: Independently;sit to/from stand Pt Will Transfer to Toilet: Independently;ambulating;regular height toilet;grab bars Pt Will Perform Toileting - Clothing Manipulation and hygiene: Independently;sit to/from stand  Plan Discharge plan remains appropriate    Co-evaluation    PT/OT/SLP Co-Evaluation/Treatment: Yes Reason for Co-Treatment: Complexity of the patient's impairments (multi-system involvement);For patient/therapist safety;To address functional/ADL transfers PT goals addressed during session: Mobility/safety with mobility;Balance;Strengthening/ROM OT goals addressed during session: ADL's and self-care      AM-PAC OT "6 Clicks" Daily Activity     Outcome Measure   Help from another person eating meals?: Total Help from another person taking care of personal grooming?: Total Help from another person toileting, which includes using toliet, bedpan, or urinal?: Total Help from another person bathing (including washing, rinsing, drying)?: Total Help from another person to put on and taking off regular upper body clothing?: Total Help from another person to put on and taking off regular lower body clothing?: Total 6 Click Score: 6    End of Session    OT Visit Diagnosis: Other abnormalities of gait and mobility (R26.89);Other  symptoms and signs involving cognitive function   Activity Tolerance Patient limited by lethargy   Patient Left in bed;with call bell/phone within reach;with bed alarm set;with family/visitor present    Nurse Communication Mobility status        Time: 6754-4920 OT Time Calculation (min): 33 min  Charges: OT General Charges $OT Visit: 1 Visit OT Treatments $Self Care/Home Management : 8-22 mins  Helene Kelp OTR/L Acute Rehabilitation Services Office: [place] 12/22/2020, 3:14 PM

## 2020-12-22 NOTE — Consult Note (Signed)
   [facility] CM Inpatient Consult   12/22/2020  [place] 1940-08-07 [id number]  [facility] [[facility]] Patient:  Medicare CMS DCE   Patient screened for readmission hospitalization however, this was an actual transfer to [facility].  Review of patient's medical record reveals patient is being recommended for an inpatient rehabilitation level of care. Plan:  Continue to follow progress and disposition to assess for post hospital care management needs.    For questions contact:   Natividad Brood, RN BSN [facility] Liaison  [phone] business mobile phone Toll free office [phone]  Fax number: [phone] [email or web address]

## 2020-12-22 NOTE — Progress Notes (Signed)
EEG complete - results pending 

## 2020-12-22 NOTE — Progress Notes (Signed)
PROGRESS NOTE        PATIENT DETAILS Name: David [name]. Age: 80 y.o. Sex: male Date of Birth: December 29, 1940 Admit Date: 12/27/2020 Admitting Physician No admitting provider for patient encounter. PJK:[email or web address], [name], MD  Brief Narrative: Patient is a 80 y.o. male with history of metastatic melanoma with bone/lung/brain mets-s/p brain radiation/left frontal craniotomy and tumor excision (July 2017), acute promyelocytic leukemia, hemorrhagic CVA, HTN, CAD s/p CABG 2014, AV block-s/p PPM placement, OSA on CPAP, depression who presented to [facility] with aphasia-found to have EEG evidence of seizures originating from the left temporal region-subsequently transferred to [facility] for LTM EEG.  Subsequent hospital course complicated by encephalopathy and fever.  See below for further details.  Subjective: Sleeping comfortably-awakes-answers questions appropriately-follows simple commands.  Afebrile overnight.  Objective: Vitals: Blood pressure 128/72, pulse 75, temperature 98.4 F (36.9 C), temperature source Oral, resp. rate 14, SpO2 95 %.   Exam: Gen Exam: Sleepy-but awake and alert.  Following simple commands.  Looks frail. HEENT:atraumatic, normocephalic Chest: B/L clear to auscultation anteriorly CVS:S1S2 regular Abdomen:soft non tender, non distended Extremities:no edema Neurology: Has generalized weakness-but moving all 4 extremities. Skin: no rash  Pertinent labs/radiology: Na: 143 K: 5.3 Creatinine: 2.02 Hb: 17.6 COVID PCR: Negative Blood culture: Negative CXR: No obvious PNA   Assessment/Plan: New refractory focal seizures: On Keppra/Depakote/Onfi/Vimpat-neurology following with plans to repeat video EEG today.  Acute toxic/metabolic encephalopathy: Multifactorial etiology from seizures/elevated Depakote levels/fever.  Apparently was very lethargic for the past 2-3 days-currently sleepy but awakes/conversant.  Plan is to continue to treat the  underlying pathologies and follow.  Fever-?  Aspiration PNA: Developed fever on 9/13-this was in the setting of confusion/lethargy-high suspicion for aspiration pneumonia even though CXR negative.  UA/blood cultures/COVID negative-no other sources of infection apparent.  Plan is to empirically continue with IV antibiotics (will switch to Unasyn)-and follow fever curve.  If no improvement in fever curve-and cultures continue to be negative-we will need CT chest/abdomen/lower extremity Dopplers.  Follow.  CKD stage IIIb: Creatinine close to baseline-follow periodically.  Hyperkalemia: Mild-1 dose of Lokelma-repeat electrolytes tomorrow.  HTN: BP relatively stable-continue metoprolol-amlodipine on hold-follow BP trend and resume accordingly.  History of CAD s/p CABG in 2014: No anginal symptoms-continue supportive care.  On aspirin/beta-blocker.  History of complete heart block-PPM in place  History of hemiplegia and hemiparesis following nontraumatic intracranial hemorrhage affecting right dominant side  History of metastatic melanoma with metastasis to the lingula, right upper lung, supraventricular clavicular lymph node, left frontal lobe metastasis-s/p resection: Resume outpatient follow-up with oncology/neurooncology.    History of acute promyelocytic leukemia: Follow CBC periodically-in remission-stable for outpatient follow-up with primary oncologist.  Major depressive disorder: Continue trazodone/Zoloft.  Hypothyroidism: Continue levothyroxine.  Recent TSH in May was within normal limits.   OSA :on CPAP  Functional quadriplegia/severe debility/deconditioning: Normally able to ambulate with the help of walker-current significant debility is from acute illness.  Await PT/OT evaluation-probably will need either CIR SNF on discharge.  Obesity: Estimated body mass index is 30.41 kg/m as calculated from the following:   Height as of 12/15/20: 5\' 8"  (1.727 m).   Weight as of 12/15/20: 90.7  kg.    Procedures : None  Consults: Neurology  DVT Prophylaxis :  Diet: Diet Order             DIET DYS 3 Room service appropriate? Yes;  Fluid consistency: Thin  Diet effective now                    Code Status: DNR  Family Communication: Daughter at bedside.  Disposition Plan: Status is: Inpatient  Remains inpatient appropriate because:Inpatient level of care appropriate due to severity of illness  Dispo: The patient is from: Home              Anticipated d/c is to: SNF              Patient currently is not medically stable to d/c.   Difficult to place patient No    Barriers to Discharge: Ongoing encephalopathy-intractable seizures-LTM EEG in progress-not stable for discharge.  Antimicrobial agents: Anti-infectives (From admission, onward)    Start     Dose/Rate Route Frequency Ordered Stop   12/21/20 1900  azithromycin (ZITHROMAX) 500 mg in sodium chloride 0.9 % 250 mL IVPB  Status:  Discontinued        500 mg 250 mL/hr over 60 Minutes Intravenous Every 24 hours 12/21/20 1805 12/22/20 1058   12/21/20 1730  cefTRIAXone (ROCEPHIN) 1 g in sodium chloride 0.9 % 100 mL IVPB  Status:  Discontinued        1 g 200 mL/hr over 30 Minutes Intravenous Every 24 hours 12/21/20 1633 12/22/20 1058   12/21/20 1730  azithromycin (ZITHROMAX) tablet 500 mg  Status:  Discontinued        500 mg Oral Daily 12/21/20 1633 12/21/20 1804        Time spent: 35 minutes-Greater than 50% of this time was spent in counseling, explanation of diagnosis, planning of further management, and coordination of care.  MEDICATIONS: Scheduled Meds:  allopurinol  200 mg Oral Daily   aspirin EC  81 mg Oral QPM   cloBAZam  10 mg Oral BID   enoxaparin (LOVENOX) injection  40 mg Subcutaneous Q24H   lactulose  20 g Oral Daily   levothyroxine  50 mcg Intravenous Daily   metoprolol succinate  75 mg Oral Daily   sertraline  50 mg Oral Daily   traZODone  25 mg Oral QHS   Continuous  Infusions:  dextrose 5% lactated ringers Stopped (12/22/20 1028)   lacosamide (VIMPAT) IV 150 mg (12/22/20 1028)   levETIRAcetam (KEPPRA) IVPB 750 mg (12/22/20 1035)   valproate sodium 500 mg (12/22/20 1038)   And   valproate sodium     PRN Meds:.acetaminophen **OR** acetaminophen, albuterol, colchicine, meclizine, midazolam, ondansetron **OR** ondansetron (ZOFRAN) IV   LABORATORY DATA: CBC: Recent Labs  Lab 12/15/20 1206 12/16/20 0456 12/17/20 2048 12/19/20 0308 12/22/20 0404  WBC 4.9 5.3 5.7 6.7 4.5  NEUTROABS 3.7  --  4.2  --   --   HGB 16.3 15.5 15.6 15.5 17.6*  HCT 46.5 45.2 45.7 45.4 54.0*  MCV 92.1 93.6 94.8 94.2 96.1  PLT 168 173 165 167 PLATELET CLUMPS NOTED ON SMEAR, UNABLE TO ESTIMATE    Basic Metabolic Panel: Recent Labs  Lab 12/17/20 2048 12/19/20 0308 12/20/20 0316 12/21/20 0643 12/22/20 0404  NA 136 139 139 140 143  K 4.8 4.7 5.1 4.5 5.3*  CL 103 108 107 106 103  CO2 27 22 22 27 23   GLUCOSE 87 85 90 93 116*  BUN 45* 41* 46* 42* 47*  CREATININE 1.85* 1.93* 1.97* 2.07* 2.02*  CALCIUM 9.1 9.2 9.2 9.1 9.3  MG  --   --  2.5*  --   --     GFR:  Estimated Creatinine Clearance: 31.4 mL/min (A) (by C-G formula based on SCr of 2.02 mg/dL (H)).  Liver Function Tests: Recent Labs  Lab 12/15/20 1206 12/17/20 2048  AST 25 17  ALT 14 12  ALKPHOS 51 48  BILITOT 0.7 0.6  PROT 6.9 6.5  ALBUMIN 4.3 3.7   No results for input(s): LIPASE, AMYLASE in the last 168 hours. Recent Labs  Lab 12/15/20 1543 12/17/20 2048 12/21/20 0914  AMMONIA 17 18 42*    Coagulation Profile: Recent Labs  Lab 12/15/20 1206  INR 1.0    Cardiac Enzymes: No results for input(s): CKTOTAL, CKMB, CKMBINDEX, TROPONINI in the last 168 hours.  BNP (last 3 results) No results for input(s): PROBNP in the last 8760 hours.  Lipid Profile: No results for input(s): CHOL, HDL, LDLCALC, TRIG, CHOLHDL, LDLDIRECT in the last 72 hours.  Thyroid Function Tests: No results for  input(s): TSH, T4TOTAL, FREET4, T3FREE, THYROIDAB in the last 72 hours.  Anemia Panel: No results for input(s): VITAMINB12, FOLATE, FERRITIN, TIBC, IRON, RETICCTPCT in the last 72 hours.  Urine analysis:    Component Value Date/Time   COLORURINE YELLOW 12/22/2020 0340   APPEARANCEUR CLEAR 12/22/2020 0340   LABSPEC 1.020 12/22/2020 0340   PHURINE 6.0 12/22/2020 0340   GLUCOSEU NEGATIVE 12/22/2020 0340   GLUCOSEU NEGATIVE 10/12/2020 1118   HGBUR LARGE (A) 12/22/2020 0340   BILIRUBINUR NEGATIVE 12/22/2020 0340   KETONESUR NEGATIVE 12/22/2020 0340   PROTEINUR 100 (A) 12/22/2020 0340   UROBILINOGEN 0.2 10/12/2020 1118   NITRITE NEGATIVE 12/22/2020 0340   LEUKOCYTESUR NEGATIVE 12/22/2020 0340    Sepsis Labs: Lactic Acid, Venous    Component Value Date/Time   LATICACIDVEN 0.8 01/03/2019 0046    MICROBIOLOGY: Recent Results (from the past 240 hour(s))  Resp Panel by RT-PCR (Flu A&B, Covid) Nasopharyngeal Swab     Status: None   Collection Time: 12/15/20  1:03 PM   Specimen: Nasopharyngeal Swab; Nasopharyngeal(NP) swabs in vial transport medium  Result Value Ref Range Status   SARS Coronavirus 2 by RT PCR NEGATIVE NEGATIVE Final    Comment: (NOTE) SARS-CoV-2 target nucleic acids are NOT DETECTED.  The SARS-CoV-2 RNA is generally detectable in upper respiratory specimens during the acute phase of infection. The lowest concentration of SARS-CoV-2 viral copies this assay can detect is 138 copies/mL. A negative result does not preclude SARS-Cov-2 infection and should not be used as the sole basis for treatment or other patient management decisions. A negative result may occur with  improper specimen collection/handling, submission of specimen other than nasopharyngeal swab, presence of viral mutation(s) within the areas targeted by this assay, and inadequate number of viral copies(<138 copies/mL). A negative result must be combined with clinical observations, patient history, and  epidemiological information. The expected result is Negative.  Fact Sheet for Patients:  EntrepreneurPulse.com.au  Fact Sheet for Healthcare Providers:  IncredibleEmployment.be  This test is no t yet approved or cleared by the [place] FDA and  has been authorized for detection and/or diagnosis of SARS-CoV-2 by FDA under an Emergency Use Authorization (EUA). This EUA will remain  in effect (meaning this test can be used) for the duration of the COVID-19 declaration under Section 564(b)(1) of the Act, 21 U.S.C.section 360bbb-3(b)(1), unless the authorization is terminated  or revoked sooner.       Influenza A by PCR NEGATIVE NEGATIVE Final   Influenza B by PCR NEGATIVE NEGATIVE Final    Comment: (NOTE) The Xpert Xpress SARS-CoV-2/FLU/RSV plus assay is intended as an  aid in the diagnosis of influenza from Nasopharyngeal swab specimens and should not be used as a sole basis for treatment. Nasal washings and aspirates are unacceptable for Xpert Xpress SARS-CoV-2/FLU/RSV testing.  Fact Sheet for Patients: EntrepreneurPulse.com.au  Fact Sheet for Healthcare Providers: IncredibleEmployment.be  This test is not yet approved or cleared by the [place] FDA and has been authorized for detection and/or diagnosis of SARS-CoV-2 by FDA under an Emergency Use Authorization (EUA). This EUA will remain in effect (meaning this test can be used) for the duration of the COVID-19 declaration under Section 564(b)(1) of the Act, 21 U.S.C. section 360bbb-3(b)(1), unless the authorization is terminated or revoked.  Performed at [facility], [place], [place] [address]   Resp Panel by RT-PCR (Flu A&B, Covid) Nasopharyngeal Swab     Status: None   Collection Time: 12/21/20  4:34 PM   Specimen: Nasopharyngeal Swab; Nasopharyngeal(NP) swabs in vial transport medium  Result Value Ref Range  Status   SARS Coronavirus 2 by RT PCR NEGATIVE NEGATIVE Final    Comment: (NOTE) SARS-CoV-2 target nucleic acids are NOT DETECTED.  The SARS-CoV-2 RNA is generally detectable in upper respiratory specimens during the acute phase of infection. The lowest concentration of SARS-CoV-2 viral copies this assay can detect is 138 copies/mL. A negative result does not preclude SARS-Cov-2 infection and should not be used as the sole basis for treatment or other patient management decisions. A negative result may occur with  improper specimen collection/handling, submission of specimen other than nasopharyngeal swab, presence of viral mutation(s) within the areas targeted by this assay, and inadequate number of viral copies(<138 copies/mL). A negative result must be combined with clinical observations, patient history, and epidemiological information. The expected result is Negative.  Fact Sheet for Patients:  EntrepreneurPulse.com.au  Fact Sheet for Healthcare Providers:  IncredibleEmployment.be  This test is no t yet approved or cleared by the [place] FDA and  has been authorized for detection and/or diagnosis of SARS-CoV-2 by FDA under an Emergency Use Authorization (EUA). This EUA will remain  in effect (meaning this test can be used) for the duration of the COVID-19 declaration under Section 564(b)(1) of the Act, 21 U.S.C.section 360bbb-3(b)(1), unless the authorization is terminated  or revoked sooner.       Influenza A by PCR NEGATIVE NEGATIVE Final   Influenza B by PCR NEGATIVE NEGATIVE Final    Comment: (NOTE) The Xpert Xpress SARS-CoV-2/FLU/RSV plus assay is intended as an aid in the diagnosis of influenza from Nasopharyngeal swab specimens and should not be used as a sole basis for treatment. Nasal washings and aspirates are unacceptable for Xpert Xpress SARS-CoV-2/FLU/RSV testing.  Fact Sheet for  Patients: EntrepreneurPulse.com.au  Fact Sheet for Healthcare Providers: IncredibleEmployment.be  This test is not yet approved or cleared by the [place] FDA and has been authorized for detection and/or diagnosis of SARS-CoV-2 by FDA under an Emergency Use Authorization (EUA). This EUA will remain in effect (meaning this test can be used) for the duration of the COVID-19 declaration under Section 564(b)(1) of the Act, 21 U.S.C. section 360bbb-3(b)(1), unless the authorization is terminated or revoked.  Performed at [facility], [place] [address], [place], [place] [address]   MRSA Next Gen by PCR, Nasal     Status: Abnormal   Collection Time: 12/21/20  4:36 PM   Specimen: Nasopharyngeal Swab; Nasal Swab  Result Value Ref Range Status   MRSA by PCR Next Gen NEGATIVE (A) NOT DETECTED Final  Comment:        The GeneXpert MRSA Assay (FDA approved for NASAL specimens only), is one component of a comprehensive MRSA colonization surveillance program. It is not intended to diagnose MRSA infection nor to guide or monitor treatment for MRSA infections. Performed at [facility], [place] [address] [place], [place] [address]   Culture, blood (routine x 2)     Status: None (Preliminary result)   Collection Time: 12/21/20  5:13 PM   Specimen: BLOOD RIGHT HAND  Result Value Ref Range Status   Specimen Description BLOOD RIGHT HAND  Final   Special Requests   Final    BOTTLES DRAWN AEROBIC ONLY Blood Culture results may not be optimal due to an inadequate volume of blood received in culture bottles   Culture   Final    NO GROWTH < 12 HOURS Performed at [facility], [place] [address], [place], [place] [address]    Report Status PENDING  Incomplete  Culture, blood (routine x 2)     Status: None (Preliminary result)   Collection Time: 12/21/20  5:45 PM   Specimen: BLOOD RIGHT ARM  Result Value Ref Range Status   Specimen Description  BLOOD RIGHT ARM  Final   Special Requests   Final    BOTTLES DRAWN AEROBIC ONLY Blood Culture adequate volume   Culture   Final    NO GROWTH < 12 HOURS Performed at [facility], [place] [address], [place], [place] [address]    Report Status PENDING  Incomplete    RADIOLOGY STUDIES/RESULTS: CT HEAD WO CONTRAST (5MM)  Result Date: 12/21/2020 CLINICAL DATA:  Mental status change, unknown cause EXAM: CT HEAD WITHOUT CONTRAST TECHNIQUE: Contiguous axial images were obtained from the base of the skull through the vertex without intravenous contrast. COMPARISON:  12/15/2020. FINDINGS: Brain: Similar postsurgical changes of left frontal craniotomy with subjacent encephalomalacia and gliosis. Similar ex vacuo ventricular dilation of the adjacent frontal horn of the left lateral ventricle no evidence of acute large vascular territory infarct, acute hemorrhage, hydrocephalus, mass lesion, extra-axial fluid collection or midline shift. Similar generalized atrophy with ex vacuo ventricular dilation. Additional patchy white matter hypoattenuation, nonspecific but compatible with chronic microvascular ischemic disease. Vascular: No hyperdense vessel identified. Calcific intracranial atherosclerosis. Skull: No acute fracture.  Left frontal craniotomy. Sinuses/Orbits: Clear sinuses. Postsurgical changes of the left globe. No evidence of acute orbital findin abnormality. G Other: No mastoid effusions. IMPRESSION: No evidence of acute intracranial abnormality. Electronically Signed   By: Margaretha [place] M.D.   On: 12/21/2020 16:16   DG CHEST PORT 1 VIEW  Result Date: 12/21/2020 CLINICAL DATA:  Sudden onset of fever. EXAM: PORTABLE CHEST 1 VIEW COMPARISON:  Chest x-ray 12/19/2020, CT chest 01/05/2019 FINDINGS: The heart and mediastinal contours are unchanged. Aortic calcification. Three lead cardiac pacemaker in similar position. Cardiac surgical changes again noted. No focal consolidation. No pulmonary edema. No  pleural effusion. No pneumothorax. No acute osseous abnormality.  Intact sternotomy wires. IMPRESSION: No active disease. Electronically Signed   By: Iven Finn M.D.   On: 12/21/2020 17:08     LOS: 4 days   Oren Binet, MD  [facility]    To contact the attending provider between 7A-7P or the covering provider during after hours 7P-7A, please log into the web site [email or web address] and access using universal [employer] password for that web site. If you do not have the password, please call the hospital operator.  12/22/2020, 10:58 AM

## 2020-12-22 NOTE — Progress Notes (Signed)
Pharmacy Antibiotic Note  David [name]. is a 80 y.o. male admitted on 12/26/2020 with AMS. Pharmacy has been consulted for Unasyn dosing.  Patient initially presented to [facility] with aphasia and was found to have EEG evidence of seizures. Subsequent hospital course has been complicated by encephalopathy and fever.  Became febrile on 9/13 PM (Tmax 102.1) along with confusion and lethargy. Due to suspicion for aspiration pneumonia will empirically continue with IV antibiotics (de-escalate to Unasyn) and monitor s/sx improvement.  Scr remains elevated @ 2.02 (CrCL ~31 mL/min). Will renally adjust Unaysn for now and reassess as renal function improves.  Plan: Start Unasyn 1.5g Q8H   Temp (24hrs), Avg:99.7 F (37.6 C), Min:97.7 F (36.5 C), Max:102.7 F (39.3 C)  Recent Labs  Lab 12/15/20 1206 12/15/20 1543 12/16/20 0456 12/17/20 2048 12/19/20 0308 12/20/20 0316 12/21/20 0643 12/22/20 0404  WBC 4.9  --  5.3 5.7 6.7  --   --  4.5  CREATININE 2.07*   < > 1.84* 1.85* 1.93* 1.97* 2.07* 2.02*   < > = values in this interval not displayed.    Estimated Creatinine Clearance: 31.4 mL/min (A) (by C-G formula based on SCr of 2.02 mg/dL (H)).    Allergies  Allergen Reactions   Ezetimibe Other (See Comments)    Unknown reaction   Simvastatin Other (See Comments)    Reaction:  Gave pt a fever  Fever - temp of 103.   In 2003    Antimicrobials this admission: Ceftriaxone 9/13 x1  Microbiology results: 9/13 MRSA PCR: negative   Thank you for allowing pharmacy to be a part of this patient's care.  Ardyth Harps, PharmD Clinical Pharmacist

## 2020-12-22 NOTE — Evaluation (Signed)
Physical Therapy Evaluation Patient Details Name: David [name]. MRN: [id number] DOB: May 07, 1940 Today's Date: 12/22/2020  History of Present Illness  80 y.o. maled presented to ER 12/17/2020 secondary to AMS, word-finding difficulties; admitted for TIA/CVA work up.  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  Per neurology, patient noted with active seizure activity on EEG, correlating with episodes of speech difficulties. Keppra initiated (and planning to increase dosage). 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  Clinical Impression  Pt remains lethargic, with difficulty sustaining arousal to participate much in therapy co session with PT/OT.  Total assist for all levels of mobility, max assist to maintain sitting EOB.  Opens eyes briefly.  Family is hopeful he will come around.  PT to follow acutely for deficits listed below.      Recommendations for follow up therapy are one component of a multi-disciplinary discharge planning process, led by the attending physician.  Recommendations may be updated based on patient status, additional functional criteria and insurance authorization.  Follow Up Recommendations SNF    Equipment Recommendations  Wheelchair (measurements PT);Wheelchair cushion (measurements PT);Hospital bed;Other (comment) (hoyer lift)    Recommendations for Other Services       Precautions / Restrictions Precautions Precautions: Fall Precaution Comments: seziure precautions Restrictions Weight Bearing Restrictions: No      Mobility  Bed Mobility Overal bed mobility: Needs Assistance Bed Mobility: Rolling;Sit to Sidelying;Supine to Sit Rolling: Total assist;+2 for  physical assistance   Supine to sit: Total assist;+2 for physical assistance;HOB elevated Sit to supine: Total assist;+2 for physical assistance   General bed mobility comments: Two person total assist to roll for pericare, come to sitting EOB and return to supine, initiating all movement for pt at trunk and legs.    Transfers                 General transfer comment: unable to support himself in sitting, so did not attempt  Ambulation/Gait                Stairs            Wheelchair Mobility    Modified Rankin (Stroke Patients Only)       Balance Overall balance assessment: Needs assistance Sitting-balance support: Feet supported;Bilateral upper extremity supported;No upper extremity supported;Single extremity supported Sitting balance-Leahy Scale: Zero Sitting balance - Comments: up to max assist EOB with weak flexion balance reaction when support was lightened, attempted to see if being upright would increase his arousal level, but it did not.  Attempted command following, trunk extension, arousal, midline sitting at EOB, but ultimately the longer we sat the quicker he fatigued and arousal was an issue.  EEG wires remained intact. Postural control: Posterior lean;Left lateral lean                                   Pertinent Vitals/Pain Pain Assessment: Faces Faces Pain Scale: Hurts even more Pain Location: right knee with flexion/movement Pain Descriptors / Indicators: Guarding;Grimacing Pain Intervention(s): Limited activity within patient's tolerance;Monitored during session;Repositioned    Home Living Family/patient expects to be discharged to:: Private residence Living Arrangements: Spouse/significant other Available Help at Discharge: Family;Personal care attendant Type of Home: House  Home Access: Stairs to enter Entrance Stairs-Rails: Left Entrance Stairs-Number of Steps: 2 Home Layout: One level Home Equipment: Cane -  quad Additional Comments: PCA is with them 4 hours a day 7 days a week (pulled from chart review)    Prior Function Level of Independence: Independent with assistive device(s)         Comments: Mod indep with ADLs, household and limited community mobilization; intermittent use of quad cane (esp if outside of the home).  Endorses single fall in previous year.  Has aide that comes 4 hours/day to assist with household chores and self-care as needed. (pulled from chart review)     Hand Dominance   Dominant Hand: Right    Extremity/Trunk Assessment   Upper Extremity Assessment Upper Extremity Assessment: Defer to OT evaluation    Lower Extremity Assessment Lower Extremity Assessment: RLE deficits/detail;LLE deficits/detail RLE Deficits / Details: bil leg weakness, left leg moving significantly more than R leg and when R knee is flexed pt grimaces, responds to pain. LLE Deficits / Details: bil leg weakness, left leg moving significantly more than R leg and when R knee is flexed pt grimaces, responds to pain.  will kick left leg weakly when asked, would not kick right leg at all.    Cervical / Trunk Assessment Cervical / Trunk Assessment: Kyphotic (forward head, very weak cervical extensors and trunk muscles.)  Communication   Communication: Expressive difficulties  Cognition Arousal/Alertness: Lethargic Behavior During Therapy: Flat affect Overall Cognitive Status: Impaired/Different from baseline Area of Impairment: Orientation;Attention;Following commands;Memory;Safety/judgement;Problem solving;Awareness                 Orientation Level: Disoriented to;Place;Time;Situation Current Attention Level: Focused Memory: Decreased short-term memory Following Commands: Follows one step commands inconsistently;Follows one step commands with increased time Safety/Judgement: Decreased awareness of safety;Decreased awareness of deficits Awareness: Intellectual Problem Solving: Slow  processing;Decreased initiation;Difficulty sequencing;Requires verbal cues;Requires tactile cues General Comments: lethargic, responds to voice, needs frequent re-arousal, followed 2 one step commands, but often loses what I am asking him before he can process it.  Arousal is poor.  Did say his sister's name and his own name, but could not get his birth month even with multiple choice.      General Comments      Exercises     Assessment/Plan    PT Assessment Patient needs continued PT services  PT Problem List Decreased strength;Decreased activity tolerance;Decreased range of motion;Decreased balance;Decreased mobility;Decreased knowledge of use of DME;Impaired sensation;Decreased safety awareness;Decreased knowledge of precautions;Decreased cognition;Pain       PT Treatment Interventions DME instruction;Stair training;Gait training;Functional mobility training;Therapeutic activities;Therapeutic exercise;Balance training;Neuromuscular re-education;Cognitive remediation;Patient/family education;Wheelchair mobility training;Modalities    PT Goals (Current goals can be found in the Care Plan section)  Acute Rehab PT Goals Patient Stated Goal: family wants him to wake up and get better PT Goal Formulation: With family Time For Goal Achievement: 01/05/21 Potential to Achieve Goals: Fair    Frequency Min 2X/week   Barriers to discharge        Co-evaluation PT/OT/SLP Co-Evaluation/Treatment: Yes Reason for Co-Treatment: Complexity of the patient's impairments (multi-system involvement);For patient/therapist safety;To address functional/ADL transfers PT goals addressed during session: Mobility/safety with mobility;Balance;Strengthening/ROM OT goals addressed during session: ADL's and self-care       AM-PAC PT "6 Clicks" Mobility  Outcome Measure Help needed turning from your back to your side while in a flat bed without using bedrails?: Total Help needed moving from lying on your  back to sitting on the  side of a flat bed without using bedrails?: Total Help needed moving to and from a bed to a chair (including a wheelchair)?: Total Help needed standing up from a chair using your arms (e.g., wheelchair or bedside chair)?: Total Help needed to walk in hospital room?: Total Help needed climbing 3-5 steps with a railing? : Total 6 Click Score: 6    End of Session   Activity Tolerance: Patient limited by fatigue;Patient limited by lethargy Patient left: in bed;with call bell/phone within reach;with bed alarm set;with family/visitor present   PT Visit Diagnosis: Muscle weakness (generalized) (M62.81);Difficulty in walking, not elsewhere classified (R26.2);Hemiplegia and hemiparesis Hemiplegia - Right/Left: Right Hemiplegia - dominant/non-dominant: Dominant Hemiplegia - caused by: Other cerebrovascular disease (post ictal)    Time: 6840-3353 PT Time Calculation (min) (ACUTE ONLY): 33 min   Charges:   PT Evaluation $PT Eval Moderate Complexity: [place], PT, DPT  Acute Rehabilitation Ortho Tech Supervisor [phone] pager [phone]) [phone] office

## 2020-12-22 NOTE — Progress Notes (Addendum)
Subjective: Patient sitting up in bed, states he is feeling better today.  Shakes his head no for headache, dizziness, vision changes, did not answer other questions.  ROS: Unable to obtain due to poor mental status  Examination  Vital signs in last 24 hours: Temp:  [97.7 F (36.5 C)-102.7 F (39.3 C)] 98.4 F (36.9 C) (09/14 0904) Pulse Rate:  [73-93] 75 (09/14 0904) Resp:  [14-20] 14 (09/14 0904) BP: (114-145)/(72-93) 128/72 (09/14 0904) SpO2:  [95 %-100 %] 95 % (09/14 0904)  General: lying in bed, NAD CVS: pulse-normal rate and rhythm RS: breathing comfortably, coarse breath sounds bilaterally Extremities: normal, warm    Neuro: MS: Awake, alert, oriented to person, did not answer rest of the orientation questions, follows simple one-step commands CN: pupils equal and reactive, EOMI, face symmetric, tongue midline, normal sensation over face Motor: Spontaneously moving extremities in bed left more than right, did have antigravity strength distally in right upper extremity and 2/5 strength in right lower extremity   Basic Metabolic Panel: Recent Labs  Lab 12/17/20 2048 12/19/20 0308 12/20/20 0316 12/21/20 0643 12/22/20 0404  NA 136 139 139 140 143  K 4.8 4.7 5.1 4.5 5.3*  CL 103 108 107 106 103  CO2 27 22 22 27 23   GLUCOSE 87 85 90 93 116*  BUN 45* 41* 46* 42* 47*  CREATININE 1.85* 1.93* 1.97* 2.07* 2.02*  CALCIUM 9.1 9.2 9.2 9.1 9.3  MG  --   --  2.5*  --   --     CBC: Recent Labs  Lab 12/15/20 1206 12/16/20 0456 12/17/20 2048 12/19/20 0308 12/22/20 0404  WBC 4.9 5.3 5.7 6.7 4.5  NEUTROABS 3.7  --  4.2  --   --   HGB 16.3 15.5 15.6 15.5 17.6*  HCT 46.5 45.2 45.7 45.4 54.0*  MCV 92.1 93.6 94.8 94.2 96.1  PLT 168 173 165 167 PLATELET CLUMPS NOTED ON SMEAR, UNABLE TO ESTIMATE     Coagulation Studies: No results for input(s): LABPROT, INR in the last 72 hours.  Imaging CTH wo contrast 12/21/2020: No evidence of acute intracranial  abnormality.  ASSESSMENT AND PLAN: 80 year old male with prior history of melanoma with mets to left frontal lobe complicated by hemorrhage in 2017 status postresection, bone mets, coronary artery disease, CKD, depression, anxiety, hypertension and hyperlipidemia who presented with progressive aphasia and confusion.  EEG showed seizures arising from left central temporal region which has been difficult to control with escalating doses of AEDs.   Focal seizures with alteration of awareness, refractory Metastatic melanoma with brain mets Fever likely due to pneumonia Delirium (improving) Hyperammonemia due to Depakote toxicity Right hemiparesis (POA) - LTM EEG showed four seizures arising from left central temporal region, average duration of seizures 3-8 minutes. During the seizure patient was unable to speak, follow commands with delay. EEG also showed evidence of cortical dysfunction in left frontal region consistent with prior craniotomy.   Recommendations -Continue Keppra 750 mg twice daily (renally adjusted), Depakote 500 mg twice daily and 250 mg in the afternoon, clobazam 10 mg twice daily and Vimpat 150 mg twice daily -We will repeat video EEG to assess for seizure frequency -If patient has intermittent seizures, will increase Vimpat to 200 mg twice daily -If very frequent seizures, will consider starting IV Solu-Medrol 1000 mg daily -Encourage PT/OT -Continue seizure precautions -Continue delirium precautions, aspiration precautions - as needed IV Versed 2 mg for clinical seizure-like activity lasting more than 15 minutes -Management of rest of comorbidities  per primary team -Discussed my plan with patient's daughter at bedside  I have spent a total of  35 minutes with the patient reviewing hospital notes,  test results, labs and examining the patient as well as establishing an assessment and plan that was discussed personally with the patient's daughter at bedside.  > 50% of time was  spent in direct patient care.    Zeb Comfort Epilepsy [facility] For questions after 5pm please refer to AMION to reach the Neurologist on call

## 2020-12-23 LAB — COMPREHENSIVE METABOLIC PANEL
ALT: 35 U/L (ref 0–44)
AST: 41 U/L (ref 15–41)
Albumin: 3 g/dL — ABNORMAL LOW (ref 3.5–5.0)
Alkaline Phosphatase: 44 U/L (ref 38–126)
Anion gap: 11 (ref 5–15)
BUN: 44 mg/dL — ABNORMAL HIGH (ref 8–23)
CO2: 21 mmol/L — ABNORMAL LOW (ref 22–32)
Calcium: 8.8 mg/dL — ABNORMAL LOW (ref 8.9–10.3)
Chloride: 112 mmol/L — ABNORMAL HIGH (ref 98–111)
Creatinine, Ser: 2.12 mg/dL — ABNORMAL HIGH (ref 0.61–1.24)
GFR, Estimated: 31 mL/min — ABNORMAL LOW (ref >60)
Glucose, Bld: 129 mg/dL — ABNORMAL HIGH (ref 70–99)
Potassium: 4.9 mmol/L (ref 3.5–5.1)
Sodium: 144 mmol/L (ref 135–145)
Total Bilirubin: 0.8 mg/dL (ref 0.3–1.2)
Total Protein: 6 g/dL — ABNORMAL LOW (ref 6.5–8.1)

## 2020-12-23 LAB — BLOOD CULTURE ID PANEL (REFLEXED) - BCID2

## 2020-12-23 LAB — URINE CULTURE: Culture: <10000 — AB

## 2020-12-23 LAB — CBC
HCT: 50.6 % (ref 39.0–52.0)
Hemoglobin: 16.6 g/dL (ref 13.0–17.0)
MCH: 31.6 pg (ref 26.0–34.0)
MCHC: 32.8 g/dL (ref 30.0–36.0)
MCV: 96.4 fL (ref 80.0–100.0)
Platelets: 127 10*3/uL — ABNORMAL LOW (ref 150–400)
RBC: 5.25 MIL/uL (ref 4.22–5.81)
RDW: 14.2 % (ref 11.5–15.5)
WBC: 5.6 10*3/uL (ref 4.0–10.5)
nRBC: 0 % (ref 0.0–0.2)

## 2020-12-23 LAB — VALPROIC ACID LEVEL: Valproic Acid Lvl: 59 ug/mL (ref 50.0–100.0)

## 2020-12-23 LAB — AMMONIA: Ammonia: 32 umol/L (ref 9–35)

## 2020-12-23 MED ORDER — CLOBAZAM 10 MG PO TABS
10.0000 mg | ORAL_TABLET | Freq: Every day | ORAL | Status: DC
  Filled 2020-12-23: qty 1

## 2020-12-23 MED ORDER — ENOXAPARIN SODIUM 30 MG/0.3ML IJ SOSY: 30.0000 mg | PREFILLED_SYRINGE | INTRAMUSCULAR | Status: DC

## 2020-12-23 NOTE — Progress Notes (Signed)
EEG maintenance performed.  Skin breakdown appears to have improved at electrode sites Fp1, Fp2.

## 2020-12-23 NOTE — Progress Notes (Signed)
PHARMACY - PHYSICIAN COMMUNICATION CRITICAL VALUE ALERT - BLOOD CULTURE IDENTIFICATION (BCID)  David [name]. is an 80 y.o. male who presented to [facility] on 12/11/2020 with a chief complaint of seizure and aphasia.  Assessment:   Bcx: 1/4 bottles with GPC BCID detected Staphylococcus Epidermidis, mecA detected   Name of physician (or Provider) Contacted: Dr.Ghimire  Current antibiotics:  Unasyn 1.5g IV q8h  Changes to prescribed antibiotics recommended:  Coagulase Negative Staphylococcus Species likely represents a contaminant.  No additional antibiotics indicated at this time.   Results for orders placed or performed during the hospital encounter of 01/01/2021  Blood Culture ID Panel (Reflexed) (Collected: 12/21/2020  5:13 PM)  Result Value Ref Range   Enterococcus faecalis NOT DETECTED NOT DETECTED   Enterococcus Faecium NOT DETECTED NOT DETECTED   Listeria monocytogenes NOT DETECTED NOT DETECTED   Staphylococcus species DETECTED (A) NOT DETECTED   Staphylococcus aureus (BCID) NOT DETECTED NOT DETECTED   Staphylococcus epidermidis DETECTED (A) NOT DETECTED   Staphylococcus lugdunensis NOT DETECTED NOT DETECTED   Streptococcus species NOT DETECTED NOT DETECTED   Streptococcus agalactiae NOT DETECTED NOT DETECTED   Streptococcus pneumoniae NOT DETECTED NOT DETECTED   Streptococcus pyogenes NOT DETECTED NOT DETECTED   A.calcoaceticus-baumannii NOT DETECTED NOT DETECTED   Bacteroides fragilis NOT DETECTED NOT DETECTED   Enterobacterales NOT DETECTED NOT DETECTED   Enterobacter cloacae complex NOT DETECTED NOT DETECTED   Escherichia coli NOT DETECTED NOT DETECTED   Klebsiella aerogenes NOT DETECTED NOT DETECTED   Klebsiella oxytoca NOT DETECTED NOT DETECTED   Klebsiella pneumoniae NOT DETECTED NOT DETECTED   Proteus species NOT DETECTED NOT DETECTED   Salmonella species NOT DETECTED NOT DETECTED   Serratia marcescens NOT DETECTED NOT DETECTED   Haemophilus influenzae  NOT DETECTED NOT DETECTED   Neisseria meningitidis NOT DETECTED NOT DETECTED   Pseudomonas aeruginosa NOT DETECTED NOT DETECTED   Stenotrophomonas maltophilia NOT DETECTED NOT DETECTED   Candida albicans NOT DETECTED NOT DETECTED   Candida auris NOT DETECTED NOT DETECTED   Candida glabrata NOT DETECTED NOT DETECTED   Candida krusei NOT DETECTED NOT DETECTED   Candida parapsilosis NOT DETECTED NOT DETECTED   Candida tropicalis NOT DETECTED NOT DETECTED   Cryptococcus neoformans/gattii NOT DETECTED NOT DETECTED   Methicillin resistance mecA/C DETECTED (A) NOT DETECTED    Lestine Box, PharmD PGY2 Infectious Diseases Pharmacy Resident   Please check [email or web address] for unit-specific pharmacy phone numbers

## 2020-12-23 NOTE — Plan of Care (Signed)
  Problem: Clinical Measurements: Goal: Will remain free from infection Outcome: Progressing Goal: Diagnostic test results will improve Outcome: Progressing Goal: Respiratory complications will improve Outcome: Progressing Goal: Cardiovascular complication will be avoided Outcome: Progressing   

## 2020-12-23 NOTE — Progress Notes (Signed)
Subjective: NAEO. No seizure overnight. Has been drowsy per RN  ROS: Unable to obtain due to poor mental status  Examination  Vital signs in last 24 hours: Temp:  [98.4 F (36.9 C)-99.3 F (37.4 C)] 98.4 F (36.9 C) (09/15 0808) Pulse Rate:  [75-77] 76 (09/15 0808) Resp:  [14-20] 14 (09/15 0808) BP: (124-165)/(84-107) 151/101 (09/15 0808) SpO2:  [93 %-99 %] 95 % (09/15 0808)  General: lying in bed, NAD CVS: pulse-normal rate and rhythm RS: breathing comfortably, coarse breath sounds bilaterally Extremities: normal, warm    Neuro: MS: drowsy, opens eyes to verbal stimulation, able to tell me his name but didn't answer other questions CN: pupils equal and reactive, EOMI, face symmetric Motor: Spontaneously moving extremities in bed left more than right   Basic Metabolic Panel: Recent Labs  Lab 12/19/20 0308 12/20/20 0316 12/21/20 0643 12/22/20 0404 12/23/20 0302  NA 139 139 140 143 144  K 4.7 5.1 4.5 5.3* 4.9  CL 108 107 106 103 112*  CO2 22 22 27 23  21*  GLUCOSE 85 90 93 116* 129*  BUN 41* 46* 42* 47* 44*  CREATININE 1.93* 1.97* 2.07* 2.02* 2.12*  CALCIUM 9.2 9.2 9.1 9.3 8.8*  MG  --  2.5*  --   --   --     CBC: Recent Labs  Lab 12/17/20 2048 12/19/20 0308 12/22/20 0404 12/23/20 0302  WBC 5.7 6.7 4.5 5.6  NEUTROABS 4.2  --   --   --   HGB 15.6 15.5 17.6* 16.6  HCT 45.7 45.4 54.0* 50.6  MCV 94.8 94.2 96.1 96.4  PLT 165 167 PLATELET CLUMPS NOTED ON SMEAR, UNABLE TO ESTIMATE 127*     Coagulation Studies: No results for input(s): LABPROT, INR in the last 72 hours.  Imaging No new imaging overnight  ASSESSMENT AND PLAN: 80 year old male with prior history of melanoma with mets to left frontal lobe complicated by hemorrhage in 2017 status postresection, bone mets, coronary artery disease, CKD, depression, anxiety, hypertension and hyperlipidemia who presented with progressive aphasia and confusion.  EEG showed seizures arising from left central temporal  region which has been difficult to control with escalating doses of AEDs.   Focal seizures with alteration of awareness ( improved) Metastatic melanoma with brain mets Acute toxic-metabolic encephalopathy  Hyperammonemia due to Depakote toxicity ( resolved) Right hemiparesis (POA) - LTM EEG didn't show any seizures overnight   Recommendations -Continue Keppra 750 mg twice daily (renally adjusted), Depakote 500 mg twice daily and 250 mg in the afternoon and Vimpat 150 mg twice daily -Reduce onfi to 10mg  qhs to minimize drowsiness. Onfi has long half life of about 40 hours so it will likely take a couple of days for patient to be more awake -Also rechecked VPA and ammonia levels due to encephalopathy -Continue video EEG to assess for seizure frequency. Will likely dc tomorrow or on Saturday if remains sz free -If patient has sz recurrence, will increase Vimpat to 200 mg twice daily -Continue seizure precautions -Continue delirium precautions, aspiration precautions - as needed IV Versed 2 mg for clinical seizure-like activity lasting more than 15 minutes -Management of rest of comorbidities per primary team -Discussed my plan with patient's daughter via message   I have spent a total of  35 minutes with the patient reviewing hospital notes,  test results, labs and examining the patient as well as establishing an assessment and plan that was discussed personally with the patient's daughter at bedside.  > 50% of time  was spent in direct patient care.   Zeb Comfort Epilepsy [facility] For questions after 5pm please refer to AMION to reach the Neurologist on call

## 2020-12-23 NOTE — Procedures (Addendum)
Patient Name: David [name].  MRN: [id number]  Epilepsy Attending: Lora Havens  Referring Physician/Provider: Dr Zeb Comfort Duration: 12/22/2020 1133 to 12/23/2020 1133   Patient history: 80 year old male with history of CVA, ICH, melanoma with mets to lung and brain who presented with aphasia.  EEG to evaluate for seizures.   Level of alertness: Awake, asleep   AEDs during EEG study: LEV, VPA, Onfi, Vimpat   Technical aspects: This EEG study was done with scalp electrodes positioned according to the 10-20 International system of electrode placement. Electrical activity was acquired at a sampling rate of 500Hz  and reviewed with a high frequency filter of 70Hz  and a low frequency filter of 1Hz . EEG data were recorded continuously and digitally stored.    Description: The posterior dominant rhythm consists of 8 Hz activity of moderate voltage (25-35 uV) seen predominantly in posterior head regions, symmetric and reactive to eye opening and eye closing. Sleep was characterized by vertex waves, sleep spindles (12-14Hz ), maximal fronto-central region. EEG also showed continuous 3 to 5 Hz sharply contoured theta-delta slowing with overriding 15 to 18 Hz beta activity in left frontal region consistent with prior craniotomy.  Sharp waves were seen in left frontal region.    ABNORMALITY -Sharp wave, left frontal region -Breach artifact, left frontal region   IMPRESSION: This study showed evidence of epileptogenicity and cortical dysfunction in left frontal region consistent with prior craniotomy. No seizures were seen during the study.   Kyonna Frier [name]

## 2020-12-23 NOTE — Plan of Care (Signed)
  Problem: Education: Goal: Knowledge of General Education information will improve Description: Including pain rating scale, medication(s)/side effects and non-pharmacologic comfort measures Outcome: Not Progressing   Problem: Health Behavior/Discharge Planning: Goal: Ability to manage health-related needs will improve Outcome: Not Progressing   Problem: Clinical Measurements: Goal: Ability to maintain clinical measurements within normal limits will improve Outcome: Progressing Goal: Will remain free from infection Outcome: Progressing Goal: Diagnostic test results will improve Outcome: Progressing Goal: Respiratory complications will improve Outcome: Progressing Goal: Cardiovascular complication will be avoided Outcome: Progressing   Problem: Activity: Goal: Risk for activity intolerance will decrease Outcome: Not Progressing   Problem: Nutrition: Goal: Adequate nutrition will be maintained Outcome: Not Progressing   Problem: Coping: Goal: Level of anxiety will decrease Outcome: Progressing   Problem: Elimination: Goal: Will not experience complications related to bowel motility Outcome: Progressing Goal: Will not experience complications related to urinary retention Outcome: Progressing   Problem: Pain Managment: Goal: General experience of comfort will improve Outcome: Progressing

## 2020-12-23 NOTE — Progress Notes (Addendum)
Difficult to fully assess patient due to excessive sleepiness due to seizure medications, he is having difficulties staying awake to follow commands. MD is aware. Pt remains alert to self and responds to voice or stimulation.

## 2020-12-23 NOTE — Progress Notes (Signed)
LTM maint complete - no skin breakdown under: Fp2 F4 F8 Serviced EEG-ECG leads [facility] monitored, Event button test confirmed by [facility].

## 2020-12-23 NOTE — Progress Notes (Signed)
PROGRESS NOTE        PATIENT DETAILS Name: David [name]. Age: 80 y.o. Sex: male Date of Birth: 31-Jul-1940 Admit Date: 12/28/2020 Admitting Physician No admitting provider for patient encounter. ZOX:[id number], [name], MD  Brief Narrative: Patient is a 80 y.o. male with history of metastatic melanoma with bone/lung/brain mets-s/p brain radiation/left frontal craniotomy and tumor excision (July 2017), acute promyelocytic leukemia, hemorrhagic CVA, HTN, CAD s/p CABG 2014, AV block-s/p PPM placement, OSA on CPAP, depression who presented to [facility] with aphasia-found to have EEG evidence of seizures originating from the left temporal region-subsequently transferred to [facility] for LTM EEG.  Subsequent hospital course complicated by encephalopathy and fever.  See below for further details.  Subjective: Drowsy/sleepy but awakes-follows simple commands.  Objective: Vitals: Blood pressure (!) 145/91, pulse 81, temperature 98.3 F (36.8 C), temperature source Oral, resp. rate 14, SpO2 98 %.    Exam: Gen Exam: [id number] protecting airway-following simple commands.   HEENT:atraumatic, normocephalic Chest: B/L clear to auscultation anteriorly CVS:S1S2 regular Abdomen:soft non tender, non distended Extremities:no edema Neurology: Double exam but moving all 4 extremities. Skin: no rash   Pertinent labs/radiology: WBC: 5.6  Hb: 16.6  Platelet: 127  Ammonia: 32. Depakote level: Pending Urine culture:<10,000 colonies Blood culture: No growth so far.  LTM EEG overnight: No seizures.   Assessment/Plan: New refractory focal seizures: No seizures on LTM EEG-neurology planning to decrease Onfi in order to minimize drowsiness/sedation-being continued on Keppra/Depakote and Vimpat.  Awaiting Depakote levels.  Neurology continues to follow  Acute toxic/metabolic encephalopathy: Suspect drowsiness is likely due to sedation from antiepileptics-particularly Onfi.   Neurology planning to decrease Onfi dosage-other antiepileptics will be adjusted based on LTM EEG findings.  Ammonia levels stable.  Continue to monitor closely.  Fever on 9/13-?  Aspiration PNA: Afebrile overnight-suspicion for aspiration pneumonia even though chest x-ray negative as he developed fever in the setting of encephalopathy/drowsiness.  Continue Unasyn-since fever curve improved-doubt further work-up required at this point.    CKD stage IIIb: Creatinine close to baseline-follow periodically.  HTN: BP relatively stable-continue metoprolol-amlodipine on hold-follow BP trend and resume accordingly.  History of CAD s/p CABG in 2014: No anginal symptoms-continue supportive care.  On aspirin/beta-blocker.  History of complete heart block-PPM in place  History of hemiplegia and hemiparesis following nontraumatic intracranial hemorrhage affecting right dominant side  History of metastatic melanoma with metastasis to the lingula, right upper lung, supraventricular clavicular lymph node, left frontal lobe metastasis-s/p resection: Resume outpatient follow-up with oncology/neurooncology.    History of acute promyelocytic leukemia: Follow CBC periodically-mild thrombocytopenia today-in remission-stable for outpatient follow-up with primary oncologist.  Major depressive disorder: Continue trazodone/Zoloft.  Hypothyroidism: Continue levothyroxine.  Recent TSH in May was within normal limits.   OSA :on CPAP  Functional quadriplegia/severe debility/deconditioning: Normally able to ambulate with the help of walker-current significant debility is from acute illness.  Await PT/OT evaluation-probably will need either CIR or SNF on discharge.  Obesity: Estimated body mass index is 30.41 kg/m as calculated from the following:   Height as of 12/15/20: 5\' 8"  (1.727 m).   Weight as of 12/15/20: 90.7 kg.    Procedures : None  Consults: Neurology  DVT Prophylaxis : Prophylactic  Lovenox  Diet: Diet Order             Diet NPO time specified  Diet effective now  Code Status: DNR  Family Communication: Daughter at bedside.  Disposition Plan: Status is: Inpatient  Remains inpatient appropriate because:Inpatient level of care appropriate due to severity of illness  Dispo: The patient is from: Home              Anticipated d/c is to: SNF              Patient currently is not medically stable to d/c.   Difficult to place patient No    Barriers to Discharge: Ongoing encephalopathy-intractable seizures-LTM EEG in progress-not stable for discharge.  Antimicrobial agents: Anti-infectives (From admission, onward)    Start     Dose/Rate Route Frequency Ordered Stop   12/22/20 1300  ampicillin-sulbactam (UNASYN) 1.5 g in sodium chloride 0.9 % 100 mL IVPB        1.5 g 200 mL/hr over 30 Minutes Intravenous Every 8 hours 12/22/20 1202     12/21/20 1900  azithromycin (ZITHROMAX) 500 mg in sodium chloride 0.9 % 250 mL IVPB  Status:  Discontinued        500 mg 250 mL/hr over 60 Minutes Intravenous Every 24 hours 12/21/20 1805 12/22/20 1058   12/21/20 1730  cefTRIAXone (ROCEPHIN) 1 g in sodium chloride 0.9 % 100 mL IVPB  Status:  Discontinued        1 g 200 mL/hr over 30 Minutes Intravenous Every 24 hours 12/21/20 1633 12/22/20 1058   12/21/20 1730  azithromycin (ZITHROMAX) tablet 500 mg  Status:  Discontinued        500 mg Oral Daily 12/21/20 1633 12/21/20 1804        Time spent: 35 minutes-Greater than 50% of this time was spent in counseling, explanation of diagnosis, planning of further management, and coordination of care.  MEDICATIONS: Scheduled Meds:  allopurinol  200 mg Oral Daily   aspirin EC  81 mg Oral QPM   cloBAZam  10 mg Oral QHS   enoxaparin (LOVENOX) injection  40 mg Subcutaneous Q24H   lactulose  20 g Oral Daily   levothyroxine  137 mcg Oral Q0600   metoprolol succinate  75 mg Oral Daily   sertraline  50 mg Oral  Daily   traZODone  25 mg Oral QHS   Continuous Infusions:  ampicillin-sulbactam (UNASYN) IV Stopped (12/23/20 0449)   dextrose 5% lactated ringers 60 mL/hr at 12/23/20 1028   lacosamide (VIMPAT) IV 150 mg (12/23/20 1053)   levETIRAcetam (KEPPRA) IVPB 750 mg (12/23/20 1001)   valproate sodium Stopped (12/22/20 2223)   And   valproate sodium Stopped (12/22/20 1606)   PRN Meds:.acetaminophen **OR** acetaminophen, albuterol, colchicine, meclizine, midazolam, ondansetron **OR** ondansetron (ZOFRAN) IV   LABORATORY DATA: CBC: Recent Labs  Lab 12/17/20 2048 12/19/20 0308 12/22/20 0404 12/23/20 0302  WBC 5.7 6.7 4.5 5.6  NEUTROABS 4.2  --   --   --   HGB 15.6 15.5 17.6* 16.6  HCT 45.7 45.4 54.0* 50.6  MCV 94.8 94.2 96.1 96.4  PLT 165 167 PLATELET CLUMPS NOTED ON SMEAR, UNABLE TO ESTIMATE 127*     Basic Metabolic Panel: Recent Labs  Lab 12/19/20 0308 12/20/20 0316 12/21/20 0643 12/22/20 0404 12/23/20 0302  NA 139 139 140 143 144  K 4.7 5.1 4.5 5.3* 4.9  CL 108 107 106 103 112*  CO2 22 22 27 23  21*  GLUCOSE 85 90 93 116* 129*  BUN 41* 46* 42* 47* 44*  CREATININE 1.93* 1.97* 2.07* 2.02* 2.12*  CALCIUM 9.2 9.2 9.1 9.3 8.8*  MG  --  2.5*  --   --   --      GFR: Estimated Creatinine Clearance: 29.9 mL/min (A) (by C-G formula based on SCr of 2.12 mg/dL (H)).  Liver Function Tests: Recent Labs  Lab 12/17/20 2048 12/23/20 0302  AST 17 41  ALT 12 35  ALKPHOS 48 44  BILITOT 0.6 0.8  PROT 6.5 6.0*  ALBUMIN 3.7 3.0*    No results for input(s): LIPASE, AMYLASE in the last 168 hours. Recent Labs  Lab 12/17/20 2048 12/21/20 0914 12/23/20 0917  AMMONIA 18 42* 32     Coagulation Profile: No results for input(s): INR, PROTIME in the last 168 hours.   Cardiac Enzymes: No results for input(s): CKTOTAL, CKMB, CKMBINDEX, TROPONINI in the last 168 hours.  BNP (last 3 results) No results for input(s): PROBNP in the last 8760 hours.  Lipid Profile: No results  for input(s): CHOL, HDL, LDLCALC, TRIG, CHOLHDL, LDLDIRECT in the last 72 hours.  Thyroid Function Tests: No results for input(s): TSH, T4TOTAL, FREET4, T3FREE, THYROIDAB in the last 72 hours.  Anemia Panel: No results for input(s): VITAMINB12, FOLATE, FERRITIN, TIBC, IRON, RETICCTPCT in the last 72 hours.  Urine analysis:    Component Value Date/Time   COLORURINE YELLOW 12/22/2020 0340   APPEARANCEUR CLEAR 12/22/2020 0340   LABSPEC 1.020 12/22/2020 0340   PHURINE 6.0 12/22/2020 0340   GLUCOSEU NEGATIVE 12/22/2020 0340   GLUCOSEU NEGATIVE 10/12/2020 1118   HGBUR LARGE (A) 12/22/2020 0340   BILIRUBINUR NEGATIVE 12/22/2020 0340   KETONESUR NEGATIVE 12/22/2020 0340   PROTEINUR 100 (A) 12/22/2020 0340   UROBILINOGEN 0.2 10/12/2020 1118   NITRITE NEGATIVE 12/22/2020 0340   LEUKOCYTESUR NEGATIVE 12/22/2020 0340    Sepsis Labs: Lactic Acid, Venous    Component Value Date/Time   LATICACIDVEN 0.8 01/03/2019 0046    MICROBIOLOGY: Recent Results (from the past 240 hour(s))  Resp Panel by RT-PCR (Flu A&B, Covid) Nasopharyngeal Swab     Status: None   Collection Time: 12/15/20  1:03 PM   Specimen: Nasopharyngeal Swab; Nasopharyngeal(NP) swabs in vial transport medium  Result Value Ref Range Status   SARS Coronavirus 2 by RT PCR NEGATIVE NEGATIVE Final    Comment: (NOTE) SARS-CoV-2 target nucleic acids are NOT DETECTED.  The SARS-CoV-2 RNA is generally detectable in upper respiratory specimens during the acute phase of infection. The lowest concentration of SARS-CoV-2 viral copies this assay can detect is 138 copies/mL. A negative result does not preclude SARS-Cov-2 infection and should not be used as the sole basis for treatment or other patient management decisions. A negative result may occur with  improper specimen collection/handling, submission of specimen other than nasopharyngeal swab, presence of viral mutation(s) within the areas targeted by this assay, and inadequate  number of viral copies(<138 copies/mL). A negative result must be combined with clinical observations, patient history, and epidemiological information. The expected result is Negative.  Fact Sheet for Patients:  EntrepreneurPulse.com.au  Fact Sheet for Healthcare Providers:  IncredibleEmployment.be  This test is no t yet approved or cleared by the [place] FDA and  has been authorized for detection and/or diagnosis of SARS-CoV-2 by FDA under an Emergency Use Authorization (EUA). This EUA will remain  in effect (meaning this test can be used) for the duration of the COVID-19 declaration under Section 564(b)(1) of the Act, 21 U.S.C.section 360bbb-3(b)(1), unless the authorization is terminated  or revoked sooner.       Influenza A by PCR NEGATIVE NEGATIVE Final   Influenza B by PCR  NEGATIVE NEGATIVE Final    Comment: (NOTE) The Xpert Xpress SARS-CoV-2/FLU/RSV plus assay is intended as an aid in the diagnosis of influenza from Nasopharyngeal swab specimens and should not be used as a sole basis for treatment. Nasal washings and aspirates are unacceptable for Xpert Xpress SARS-CoV-2/FLU/RSV testing.  Fact Sheet for Patients: EntrepreneurPulse.com.au  Fact Sheet for Healthcare Providers: IncredibleEmployment.be  This test is not yet approved or cleared by the [place] FDA and has been authorized for detection and/or diagnosis of SARS-CoV-2 by FDA under an Emergency Use Authorization (EUA). This EUA will remain in effect (meaning this test can be used) for the duration of the COVID-19 declaration under Section 564(b)(1) of the Act, 21 U.S.C. section 360bbb-3(b)(1), unless the authorization is terminated or revoked.  Performed at [facility], [place], [place] [address]   Resp Panel by RT-PCR (Flu A&B, Covid) Nasopharyngeal Swab     Status: None   Collection Time:  12/21/20  4:34 PM   Specimen: Nasopharyngeal Swab; Nasopharyngeal(NP) swabs in vial transport medium  Result Value Ref Range Status   SARS Coronavirus 2 by RT PCR NEGATIVE NEGATIVE Final    Comment: (NOTE) SARS-CoV-2 target nucleic acids are NOT DETECTED.  The SARS-CoV-2 RNA is generally detectable in upper respiratory specimens during the acute phase of infection. The lowest concentration of SARS-CoV-2 viral copies this assay can detect is 138 copies/mL. A negative result does not preclude SARS-Cov-2 infection and should not be used as the sole basis for treatment or other patient management decisions. A negative result may occur with  improper specimen collection/handling, submission of specimen other than nasopharyngeal swab, presence of viral mutation(s) within the areas targeted by this assay, and inadequate number of viral copies(<138 copies/mL). A negative result must be combined with clinical observations, patient history, and epidemiological information. The expected result is Negative.  Fact Sheet for Patients:  EntrepreneurPulse.com.au  Fact Sheet for Healthcare Providers:  IncredibleEmployment.be  This test is no t yet approved or cleared by the [place] FDA and  has been authorized for detection and/or diagnosis of SARS-CoV-2 by FDA under an Emergency Use Authorization (EUA). This EUA will remain  in effect (meaning this test can be used) for the duration of the COVID-19 declaration under Section 564(b)(1) of the Act, 21 U.S.C.section 360bbb-3(b)(1), unless the authorization is terminated  or revoked sooner.       Influenza A by PCR NEGATIVE NEGATIVE Final   Influenza B by PCR NEGATIVE NEGATIVE Final    Comment: (NOTE) The Xpert Xpress SARS-CoV-2/FLU/RSV plus assay is intended as an aid in the diagnosis of influenza from Nasopharyngeal swab specimens and should not be used as a sole basis for treatment. Nasal washings  and aspirates are unacceptable for Xpert Xpress SARS-CoV-2/FLU/RSV testing.  Fact Sheet for Patients: EntrepreneurPulse.com.au  Fact Sheet for Healthcare Providers: IncredibleEmployment.be  This test is not yet approved or cleared by the [place] FDA and has been authorized for detection and/or diagnosis of SARS-CoV-2 by FDA under an Emergency Use Authorization (EUA). This EUA will remain in effect (meaning this test can be used) for the duration of the COVID-19 declaration under Section 564(b)(1) of the Act, 21 U.S.C. section 360bbb-3(b)(1), unless the authorization is terminated or revoked.  Performed at [facility], [place] [address] [place], [place] [address]   MRSA Next Gen by PCR, Nasal     Status: Abnormal   Collection Time: 12/21/20  4:36 PM   Specimen: Nasopharyngeal Swab; Nasal Swab  Result Value Ref Range Status   MRSA by PCR Next Gen NEGATIVE (A) NOT DETECTED Final    Comment:        The GeneXpert MRSA Assay (FDA approved for NASAL specimens only), is one component of a comprehensive MRSA colonization surveillance program. It is not intended to diagnose MRSA infection nor to guide or monitor treatment for MRSA infections. Performed at [facility], [place] [address], [place], [place] [address]   Culture, blood (routine x 2)     Status: None (Preliminary result)   Collection Time: 12/21/20  5:13 PM   Specimen: BLOOD RIGHT HAND  Result Value Ref Range Status   Specimen Description BLOOD RIGHT HAND  Final   Special Requests   Final    BOTTLES DRAWN AEROBIC ONLY Blood Culture results may not be optimal due to an inadequate volume of blood received in culture bottles   Culture  Setup Time   Final    GRAM POSITIVE COCCI IN CLUSTERS AEROBIC BOTTLE ONLY Organism ID to follow Performed at [facility], [place] [address] [place], [place] [address]    Culture GRAM POSITIVE COCCI  Final   Report Status PENDING   Incomplete  Culture, blood (routine x 2)     Status: None (Preliminary result)   Collection Time: 12/21/20  5:45 PM   Specimen: BLOOD RIGHT ARM  Result Value Ref Range Status   Specimen Description BLOOD RIGHT ARM  Final   Special Requests   Final    BOTTLES DRAWN AEROBIC ONLY Blood Culture adequate volume   Culture   Final    NO GROWTH 2 DAYS Performed at [facility], [place] [address], [place], [place] [address]    Report Status PENDING  Incomplete  Urine Culture     Status: Abnormal   Collection Time: 12/22/20  3:40 AM   Specimen: Urine, Clean Catch  Result Value Ref Range Status   Specimen Description URINE, CLEAN CATCH  Final   Special Requests NONE  Final   Culture (A)  Final    <10,000 COLONIES/mL INSIGNIFICANT GROWTH Performed at [facility], [place] [address], [place], [place] [address]    Report Status 12/23/2020 FINAL  Final    RADIOLOGY STUDIES/RESULTS: CT HEAD WO CONTRAST (5MM)  Result Date: 12/21/2020 CLINICAL DATA:  Mental status change, unknown cause EXAM: CT HEAD WITHOUT CONTRAST TECHNIQUE: Contiguous axial images were obtained from the base of the skull through the vertex without intravenous contrast. COMPARISON:  12/15/2020. FINDINGS: Brain: Similar postsurgical changes of left frontal craniotomy with subjacent encephalomalacia and gliosis. Similar ex vacuo ventricular dilation of the adjacent frontal horn of the left lateral ventricle no evidence of acute large vascular territory infarct, acute hemorrhage, hydrocephalus, mass lesion, extra-axial fluid collection or midline shift. Similar generalized atrophy with ex vacuo ventricular dilation. Additional patchy white matter hypoattenuation, nonspecific but compatible with chronic microvascular ischemic disease. Vascular: No hyperdense vessel identified. Calcific intracranial atherosclerosis. Skull: No acute fracture.  Left frontal craniotomy. Sinuses/Orbits: Clear sinuses. Postsurgical changes of the left  globe. No evidence of acute orbital findin abnormality. G Other: No mastoid effusions. IMPRESSION: No evidence of acute intracranial abnormality. Electronically Signed   By: Margaretha Sheffield M.D.   On: 12/21/2020 16:16   DG CHEST PORT 1 VIEW  Result Date: 12/21/2020 CLINICAL DATA:  Sudden onset of fever. EXAM: PORTABLE CHEST 1 VIEW COMPARISON:  Chest x-ray 12/19/2020, CT chest 01/05/2019 FINDINGS: The heart and mediastinal contours are unchanged. Aortic calcification.  Three lead cardiac pacemaker in similar position. Cardiac surgical changes again noted. No focal consolidation. No pulmonary edema. No pleural effusion. No pneumothorax. No acute osseous abnormality.  Intact sternotomy wires. IMPRESSION: No active disease. Electronically Signed   By: Iven Finn M.D.   On: 12/21/2020 17:08   Overnight EEG with video  Result Date: 12/23/2020 Lora Havens, MD     12/23/2020  8:56 AM Patient Name: David [name]. MRN: [id number] Epilepsy Attending: Lora Havens Referring Physician/Provider: Dr Zeb Comfort Duration: 12/22/2020 1133 to 12/23/2020 0900  Patient history: 80 year old male with history of CVA, ICH, melanoma with mets to lung and brain who presented with aphasia.  EEG to evaluate for seizures.  Level of alertness: Awake, asleep  AEDs during EEG study: LEV, VPA, Onfi, Vimpat  Technical aspects: This EEG study was done with scalp electrodes positioned according to the 10-20 International system of electrode placement. Electrical activity was acquired at a sampling rate of 500Hz  and reviewed with a high frequency filter of 70Hz  and a low frequency filter of 1Hz . EEG data were recorded continuously and digitally stored.  Description: The posterior dominant rhythm consists of 8 Hz activity of moderate voltage (25-35 uV) seen predominantly in posterior head regions, symmetric and reactive to eye opening and eye closing. Sleep was characterized by vertex waves, sleep spindles (12-14Hz ),  maximal fronto-central region. EEG also showed continuous 3 to 5 Hz sharply contoured theta-delta slowing with overriding 15 to 18 Hz beta activity in left frontal region consistent with prior craniotomy.  Sharp waves were seen in left frontal region.   ABNORMALITY -Sharp wave, left frontal region -Breach artifact, left frontal region  IMPRESSION: This study showed evidence of epileptogenicity and cortical dysfunction in left frontal region consistent with prior craniotomy. No seizures were seen during the study.  [place]     LOS: 5 days   Oren Binet, MD  [facility]    To contact the attending provider between 7A-7P or the covering provider during after hours 7P-7A, please log into the web site [email or web address] and access using universal [place] password for that web site. If you do not have the password, please call the hospital operator.  12/23/2020, 12:02 PM

## 2020-12-23 NOTE — Progress Notes (Signed)
RT note. Patient not being placed on cpap at this time due to not being alert/ conscious enough. CPAP is in patients room, RN aware. RT will continue to monitor.

## 2020-12-24 ENCOUNTER — Inpatient Hospital Stay (HOSPITAL_COMMUNITY): Payer: Medicare Other

## 2020-12-24 ENCOUNTER — Ambulatory Visit: Payer: Medicare Other | Admitting: Internal Medicine

## 2020-12-24 DIAGNOSIS — I6389 Other cerebral infarction: Secondary | ICD-10-CM

## 2020-12-24 DIAGNOSIS — E440 Moderate protein-calorie malnutrition: Secondary | ICD-10-CM | POA: Insufficient documentation

## 2020-12-24 LAB — CBC
HCT: 52.4 % — ABNORMAL HIGH (ref 39.0–52.0)
Hemoglobin: 16.8 g/dL (ref 13.0–17.0)
MCH: 30.8 pg (ref 26.0–34.0)
MCHC: 32.1 g/dL (ref 30.0–36.0)
MCV: 96.1 fL (ref 80.0–100.0)
Platelets: 159 10*3/uL (ref 150–400)
RBC: 5.45 MIL/uL (ref 4.22–5.81)
RDW: 14.2 % (ref 11.5–15.5)
WBC: 6.4 10*3/uL (ref 4.0–10.5)
nRBC: 0 % (ref 0.0–0.2)

## 2020-12-24 LAB — CRYPTOCOCCAL ANTIGEN, CSF: Crypto Ag: NEGATIVE

## 2020-12-24 LAB — ECHOCARDIOGRAM COMPLETE
AR max vel: 1.96 cm2
AV Area VTI: 2.21 cm2
AV Area mean vel: 1.88 cm2
AV Mean grad: 4 mmHg
AV Peak grad: 7.1 mmHg
Ao pk vel: 1.33 m/s
Area-P 1/2: 2.09 cm2
S' Lateral: 2.3 cm

## 2020-12-24 LAB — LIPID PANEL
Cholesterol: 108 mg/dL (ref 0–200)
HDL: 34 mg/dL — ABNORMAL LOW (ref >40)
LDL Cholesterol: 42 mg/dL (ref 0–99)
Total CHOL/HDL Ratio: 3.2 RATIO
Triglycerides: 161 mg/dL — ABNORMAL HIGH (ref <150)
VLDL: 32 mg/dL (ref 0–40)

## 2020-12-24 LAB — CSF CELL COUNT WITH DIFFERENTIAL
RBC Count, CSF: 2 /mm3 — ABNORMAL HIGH
RBC Count, CSF: 6 /mm3 — ABNORMAL HIGH
Tube #: 4
Tube #: 1
WBC, CSF: 3 /mm3 (ref 0–5)
WBC, CSF: 3 /mm3 (ref 0–5)

## 2020-12-24 LAB — POCT I-STAT 7, (LYTES, BLD GAS, ICA,H+H)
Acid-base deficit: 1 mmol/L (ref 0.0–2.0)
Bicarbonate: 22.3 mmol/L (ref 20.0–28.0)
Calcium, Ion: 1.22 mmol/L (ref 1.15–1.40)
HCT: 48 % (ref 39.0–52.0)
Hemoglobin: 16.3 g/dL (ref 13.0–17.0)
O2 Saturation: 94 %
Potassium: 4.1 mmol/L (ref 3.5–5.1)
Sodium: 151 mmol/L — ABNORMAL HIGH (ref 135–145)
TCO2: 23 mmol/L (ref 22–32)
pCO2 arterial: 34.3 mmHg (ref 32.0–48.0)
pH, Arterial: 7.422 (ref 7.350–7.450)
pO2, Arterial: 67 mmHg — ABNORMAL LOW (ref 83.0–108.0)

## 2020-12-24 LAB — COMPREHENSIVE METABOLIC PANEL
ALT: 26 U/L (ref 0–44)
AST: 27 U/L (ref 15–41)
Albumin: 2.9 g/dL — ABNORMAL LOW (ref 3.5–5.0)
Alkaline Phosphatase: 45 U/L (ref 38–126)
Anion gap: 12 (ref 5–15)
BUN: 44 mg/dL — ABNORMAL HIGH (ref 8–23)
CO2: 20 mmol/L — ABNORMAL LOW (ref 22–32)
Calcium: 9.1 mg/dL (ref 8.9–10.3)
Chloride: 115 mmol/L — ABNORMAL HIGH (ref 98–111)
Creatinine, Ser: 2.14 mg/dL — ABNORMAL HIGH (ref 0.61–1.24)
GFR, Estimated: 30 mL/min — ABNORMAL LOW (ref >60)
Glucose, Bld: 174 mg/dL — ABNORMAL HIGH (ref 70–99)
Potassium: 4.3 mmol/L (ref 3.5–5.1)
Sodium: 147 mmol/L — ABNORMAL HIGH (ref 135–145)
Total Bilirubin: 0.6 mg/dL (ref 0.3–1.2)
Total Protein: 6.2 g/dL — ABNORMAL LOW (ref 6.5–8.1)

## 2020-12-24 LAB — PROTEIN AND GLUCOSE, CSF
Glucose, CSF: 81 mg/dL — ABNORMAL HIGH (ref 40–70)
Total  Protein, CSF: 27 mg/dL (ref 15–45)

## 2020-12-24 LAB — PHOSPHORUS: Phosphorus: 3.5 mg/dL (ref 2.5–4.6)

## 2020-12-24 LAB — HEMOGLOBIN A1C
Hgb A1c MFr Bld: 5.8 % — ABNORMAL HIGH (ref 4.8–5.6)
Mean Plasma Glucose: 119.76 mg/dL

## 2020-12-24 LAB — MAGNESIUM: Magnesium: 2.2 mg/dL (ref 1.7–2.4)

## 2020-12-24 LAB — GLUCOSE, CAPILLARY: Glucose-Capillary: 119 mg/dL — ABNORMAL HIGH (ref 70–99)

## 2020-12-24 MED ORDER — ONDANSETRON HCL 4 MG PO TABS: 4.0000 mg | ORAL_TABLET | Freq: Four times a day (QID) | ORAL | Status: DC | PRN

## 2020-12-24 MED ORDER — ENOXAPARIN SODIUM 30 MG/0.3ML IJ SOSY
30.0000 mg | PREFILLED_SYRINGE | INTRAMUSCULAR | Status: DC
  Administered 2020-12-24 – 2020-12-31 (×8): 30 mg via SUBCUTANEOUS
  Filled 2020-12-24 (×8): qty 0.3

## 2020-12-24 MED ORDER — AMPICILLIN-SULBACTAM SODIUM 3 (2-1) G IJ SOLR
3.0000 g | Freq: Two times a day (BID) | INTRAMUSCULAR | Status: AC
  Administered 2020-12-24 – 2020-12-25 (×3): 3 g via INTRAVENOUS
  Filled 2020-12-24 (×3): qty 8

## 2020-12-24 MED ORDER — ACETAMINOPHEN 650 MG RE SUPP: 650.0000 mg | Freq: Four times a day (QID) | RECTAL | Status: DC | PRN

## 2020-12-24 MED ORDER — VANCOMYCIN HCL 1250 MG/250ML IV SOLN: 1250.0000 mg | INTRAVENOUS | Status: DC

## 2020-12-24 MED ORDER — DEXTROSE-NACL 5-0.45 % IV SOLN: INTRAVENOUS | Status: DC

## 2020-12-24 MED ORDER — VANCOMYCIN HCL 1750 MG/350ML IV SOLN
1750.0000 mg | Freq: Once | INTRAVENOUS | Status: AC
  Administered 2020-12-24: 1750 mg via INTRAVENOUS
  Filled 2020-12-24: qty 350

## 2020-12-24 MED ORDER — ACETAMINOPHEN 325 MG PO TABS
650.0000 mg | ORAL_TABLET | Freq: Four times a day (QID) | ORAL | Status: DC | PRN
  Administered 2020-12-28 – 2021-01-01 (×4): 650 mg
  Filled 2020-12-24 (×4): qty 2

## 2020-12-24 MED ORDER — ASPIRIN 81 MG PO CHEW
81.0000 mg | CHEWABLE_TABLET | Freq: Every day | ORAL | Status: DC
  Administered 2020-12-25 – 2021-01-02 (×9): 81 mg
  Filled 2020-12-24 (×9): qty 1

## 2020-12-24 MED ORDER — SERTRALINE HCL 50 MG PO TABS
50.0000 mg | ORAL_TABLET | Freq: Every day | ORAL | Status: DC
  Administered 2020-12-25 – 2020-12-26 (×2): 50 mg
  Filled 2020-12-24 (×2): qty 1

## 2020-12-24 MED ORDER — FREE WATER
200.0000 mL | Status: DC
  Administered 2020-12-24 – 2020-12-25 (×4): 200 mL

## 2020-12-24 MED ORDER — LEVOTHYROXINE SODIUM 25 MCG PO TABS
137.0000 ug | ORAL_TABLET | Freq: Every day | ORAL | Status: DC
  Administered 2020-12-25 – 2021-01-02 (×8): 137 ug
  Filled 2020-12-24 (×8): qty 1

## 2020-12-24 MED ORDER — LIDOCAINE HCL (PF) 1 % IJ SOLN
5.0000 mL | Freq: Once | INTRAMUSCULAR | Status: DC
  Filled 2020-12-24: qty 5

## 2020-12-24 MED ORDER — ONDANSETRON HCL 4 MG/2ML IJ SOLN: 4.0000 mg | Freq: Four times a day (QID) | INTRAMUSCULAR | Status: DC | PRN

## 2020-12-24 MED ORDER — PERFLUTREN LIPID MICROSPHERE
1.0000 mL | INTRAVENOUS | Status: AC | PRN
  Administered 2020-12-24: 5 mL via INTRAVENOUS
  Filled 2020-12-24: qty 10

## 2020-12-24 MED ORDER — MECLIZINE HCL 12.5 MG PO TABS: 12.5000 mg | ORAL_TABLET | Freq: Three times a day (TID) | ORAL | Status: DC | PRN

## 2020-12-24 MED ORDER — OSMOLITE 1.5 CAL PO LIQD
1000.0000 mL | ORAL | Status: DC
  Administered 2020-12-24 – 2020-12-31 (×7): 1000 mL
  Filled 2020-12-24 (×3): qty 1000

## 2020-12-24 MED ORDER — PROSOURCE TF PO LIQD
45.0000 mL | Freq: Two times a day (BID) | ORAL | Status: DC
Start: 2020-12-24 — End: 2021-01-01
  Administered 2020-12-24 – 2021-01-01 (×16): 45 mL
  Filled 2020-12-24 (×15): qty 45

## 2020-12-24 MED ORDER — ALLOPURINOL 100 MG PO TABS
200.0000 mg | ORAL_TABLET | Freq: Every day | ORAL | Status: DC
  Administered 2020-12-25 – 2021-01-02 (×9): 200 mg
  Filled 2020-12-24 (×9): qty 2

## 2020-12-24 MED ORDER — METOPROLOL TARTRATE 25 MG/10 ML ORAL SUSPENSION
37.5000 mg | Freq: Two times a day (BID) | ORAL | Status: DC
  Administered 2020-12-25 – 2021-01-02 (×17): 37.5 mg
  Filled 2020-12-24 (×17): qty 15

## 2020-12-24 MED ORDER — LIDOCAINE HCL (PF) 1 % IJ SOLN
5.0000 mL | Freq: Once | INTRAMUSCULAR | Status: AC
  Administered 2020-12-24: 3 mL via INTRADERMAL

## 2020-12-24 MED ORDER — IOHEXOL 350 MG/ML SOLN
80.0000 mL | Freq: Once | INTRAVENOUS | Status: AC | PRN
  Administered 2020-12-24: 80 mL via INTRAVENOUS

## 2020-12-24 MED ORDER — COLCHICINE 0.6 MG PO TABS
0.6000 mg | ORAL_TABLET | Freq: Every day | ORAL | Status: DC | PRN
  Filled 2020-12-24: qty 1

## 2020-12-24 MED ORDER — SODIUM CHLORIDE 0.9 % IV SOLN: INTRAVENOUS | Status: DC

## 2020-12-24 MED ORDER — SODIUM CHLORIDE 0.9 % IV SOLN: 500.0000 mg | Freq: Two times a day (BID) | INTRAVENOUS | Status: DC

## 2020-12-24 MED ORDER — LEVETIRACETAM IN NACL 500 MG/100ML IV SOLN
500.0000 mg | Freq: Two times a day (BID) | INTRAVENOUS | Status: DC
  Administered 2020-12-24: 500 mg via INTRAVENOUS
  Filled 2020-12-24 (×2): qty 100

## 2020-12-24 NOTE — Progress Notes (Signed)
Initial Nutrition Assessment  DOCUMENTATION CODES:   Non-severe (moderate) malnutrition in context of chronic illness  INTERVENTION:  Once Cortrak is placed & ready for use: -Begin Osmolite 1.5 @ 32ml/hr, advance by 38ml/hr Q4H until goal rate of 83ml/hr (1433ml/d) is reached -40ml Prosource TF BID -213ml free water Q4H  At goal, TF provides 2240 kcals, 112 grams protein, 1066ml free water (229ml total free water with flushes)  NUTRITION DIAGNOSIS:   Moderate Malnutrition related to chronic illness (cancer) as evidenced by mild fat depletion, moderate fat depletion, mild muscle depletion, moderate muscle depletion, severe muscle depletion.  GOAL:   Patient will meet greater than or equal to 90% of their needs  MONITOR:   Labs, Weight trends, TF tolerance, I & O's  REASON FOR ASSESSMENT:   Consult Enteral/tube feeding initiation and management  ASSESSMENT:   Pt with PMH significant for metastatic melanoma with bone/lung/brain mets s/p brain radiation/L frontal craniotomy and tumor excision (July 2017), acute promyelocytic leukemia, hemorrhagic CVA, HTN, CAD s/p CABG 2014, AV block s/p PPM placement, OSA on CPAP, and depression presented to [facility] with aphasia and found to have seizures originating from L temporal region and transferred to [facility]. Subsequent hospital course complicated by encephalopathy and fever.  Pt with new refractory focal seizures. Pt also with possible aspiration PNA as evidenced by development of fever on 9/13.   Discussed pt with MD who reports pt extremely drowsy and not eating at this time due to extreme sleepiness. Pt noted to open eyes briefly then goes right back to sleep when attempts at interaction are made. Given lethargy and poor po intake in addition to anticipation of encephalopathy taking a few days to improve, decision has been made to pursue Cortrak placement. Discussed with Cortrak RD.   Pt previously on Heart Healthy diet with 75-90% po  intake; has been NPO since 9/15.    Reviewed weight history, no significant weight changes noted. However, weight has not been updated since 9/07 and needs to be updated.  Medications reviewed. Labs: Na 151 (H), BUN 44 (H), Cr 2.14 (H)  UOP: 6107ml x24 hours I/O: +827ml since admit  NUTRITION - FOCUSED PHYSICAL EXAM:  Flowsheet Row Most Recent Value  Orbital Region Mild depletion  Upper Arm Region Moderate depletion  Thoracic and Lumbar Region Unable to assess  Buccal Region Mild depletion  Temple Region Mild depletion  Clavicle Bone Region Moderate depletion  Clavicle and Acromion Bone Region Mild depletion  Scapular Bone Region Unable to assess  Dorsal Hand Unable to assess  Patellar Region Moderate depletion  Anterior Thigh Region Moderate depletion  Posterior Calf Region Severe depletion  Edema (RD Assessment) None  Hair Reviewed  Eyes Reviewed  Mouth Reviewed  Skin Reviewed  Nails Reviewed       Diet Order:   Diet Order             Diet NPO time specified Except for: Sips with Meds  Diet effective now                   EDUCATION NEEDS:   Not appropriate for education at this time  Skin:  Skin Assessment: Reviewed RN Assessment  Last BM:  9/15  Height:   Ht Readings from Last 1 Encounters:  12/15/20 5\' 8"  (1.727 m)    Weight:   Wt Readings from Last 10 Encounters:  12/15/20 90.7 kg  11/29/20 93.1 kg  10/05/20 94.1 kg  09/08/20 92.5 kg  08/31/20 92.3 kg  08/06/20  90.7 kg  06/17/20 93.7 kg  06/09/20 93.4 kg  03/01/20 92.1 kg  02/27/20 92.5 kg    BMI:  There is no height or weight on file to calculate BMI.  Estimated Nutritional Needs:   Kcal:  2200-2400  Protein:  110-130 grams  Fluid:  >2.2L/d    Larkin Ina, MS, RD, LDN (she/her/hers) RD pager number and weekend/on-call pager number located in [place].

## 2020-12-24 NOTE — Progress Notes (Signed)
SLP Cancellation Note  Patient Details Name: David [name]. MRN: [id number] DOB: 02-11-1941   Cancelled treatment:       Reason Eval/Treat Not Completed: Fatigue/lethargy limiting ability to participate. SLP to continue f/u for PO readiness.    [name], [place], [facility] Office Number: [phone]  [name] 12/24/2020, 11:30 AM

## 2020-12-24 NOTE — Progress Notes (Addendum)
PROGRESS NOTE        PATIENT DETAILS Name: David [name]. Age: 80 y.o. Sex: male Date of Birth: 09-02-1940 Admit Date: 12/19/2020 Admitting Physician No admitting provider for patient encounter. [email or web address], [name], MD  Brief Narrative: Patient is a 80 y.o. male with history of metastatic melanoma with bone/lung/brain mets-s/p brain radiation/left frontal craniotomy and tumor excision (July 2017), acute promyelocytic leukemia, hemorrhagic CVA, HTN, CAD s/p CABG 2014, AV block-s/p PPM placement, OSA on CPAP, depression who presented to [facility] with aphasia-found to have EEG evidence of seizures originating from the left temporal region-subsequently transferred to [facility] for LTM EEG.  Subsequent hospital course complicated by encephalopathy and fever.  See below for further details.  Subjective: Remains drowsy/sleepy-just about opens his eyes and then goes back to sleep.  Seems a bit more drowsier than yesterday.  Objective: Vitals: Blood pressure (!) 142/100, pulse 88, temperature 97.7 F (36.5 C), temperature source Oral, resp. rate 18, SpO2 90 %.   Exam: Gen Exam: Sleepy/drowsy-opens eyes to a slight sternal rub. HEENT:atraumatic, normocephalic Chest: B/L clear to auscultation anteriorly CVS:S1S2 regular Abdomen:soft non tender, non distended Extremities:no edema Neurology: Difficult exam but will withdraw to pain. Skin: no rash   Pertinent labs/radiology: ABG: pH 7.4, PCO2 34.3 NA: 147 Creatinine: 2.14 WBC: 6.4 Plt:159 Ammonia: 32. Depakote level: Pending Urine culture:<10,000 colonies Blood culture: 1/4-staff epidermidis (likely a contamination-but has PPM/ICD-repeat cx today)  LTM EEG overnight: 1 seizure from the left frontal central region lasting 15 seconds on 9/15.   Assessment/Plan: New refractory focal seizures: 1 episode of seizure yesterday afternoon on LTM EEG-remains sedated/encephalopathic-neurology following-with plans to  stop Onfi and Keppra today given persistent encephalopathy.  Remains on Depakote and Vimpat.  Will await further recommendations from neurology.    Acute toxic/metabolic encephalopathy: Suspect this is mostly from antiepileptics-discussed with neurology-Onfi/Keppra being discontinued today.  Has mild hyponatremia and slight elevation in his creatinine-which I do not think can cause this much of sedation.  ABG without hypercarbia.  Ammonia levels were stable yesterday.  Hopeful that as seizure medications decreased-his mentation will improve.    Addendum: After extensive discussion with neurology-and to rule out other causes of encephalopathy (leptomeningeal involvement by melanoma etc.) plans were to do a lumbar puncture.  However we decided to obtain a CT head before LP-which showed a right cerebellar stroke.  Evaluated subsequently by stroke MD-Dr. Murtis Sink concern that there was a possibility of basilar artery thrombosis.  After extensive discussion-with daughter-Dr. Leggett-we ordered a CTA head/neck to rule out basilar artery thrombosis.  Risk of contrast-induced nephropathy was discussed-family was accepting of risk.  CTA head did not show any occluded vessels.  Standard stroke work-up has been ordered.  Dietitian consulted for NG tube feedings.   Fever on 9/13-?  Aspiration PNA: No further fever since 9/13-high suspicion for aspiration pneumonia given sedation/lethargy and fever.  No other sources of infection apparent.  1 set of blood culture positive for coag negative staph which I suspect is a contamination-plan is to repeat cultures today.  Empirically on Unasyn-we will plan on a total of 5 days of antimicrobial therapy.    Coag negative staph bacteremia: Suspect this to be a contamination (however has PPM in place)-repeat cultures today.  After cultures were repeated-we will empirically cover with vancomycin  CKD stage IIIb: Slight elevation in creatinine-however not very far from baseline.  Continue to watch closely.  Given n.p.o. status due to severe encephalopathy-on IVF.  Hypernatremia: Mild-change IVF to half-normal.  Repeat electrolytes tomorrow.  HTN: BP relatively stable-continue metoprolol-amlodipine on hold-follow BP trend and resume accordingly.  History of CAD s/p CABG in 2014: No anginal symptoms-continue supportive care.  On aspirin/beta-blocker.  History of complete heart block-PPM in place  History of hemiplegia and hemiparesis following nontraumatic intracranial hemorrhage affecting right dominant side  History of metastatic melanoma with metastasis to the lingula, right upper lung, supraventricular clavicular lymph node, left frontal lobe metastasis-s/p resection: Resume outpatient follow-up with oncology/neurooncology.    History of acute promyelocytic leukemia: Follow CBC periodically--in remission-stable for outpatient follow-up with primary oncologist.  Major depressive disorder: Trazodone held due to encephalopathy-on Zoloft.  Hypothyroidism: Continue levothyroxine.  Recent TSH in May was within normal limits.   OSA:on CPAP  Functional quadriplegia/severe debility/deconditioning: Normally able to ambulate with the help of walker-current significant debility is from acute illness.  Await PT/OT evaluation-probably will need either CIR or SNF on discharge.  Obesity: Estimated body mass index is 30.41 kg/m as calculated from the following:   Height as of 12/15/20: 5\' 8"  (1.727 m).   Weight as of 12/15/20: 90.7 kg.    Procedures :None Consults:Neurology DVT Prophylaxis :Prophylactic Lovenox Code Status:DNR Family Communication:Daughter-[name] [phone] over the phone on 9/16.   Disposition Plan: Status is: Inpatient  Remains inpatient appropriate because:Inpatient level of care appropriate due to severity of illness  Dispo: The patient is from: Home              Anticipated d/c is to: SNF              Patient currently is not  medically stable to d/c.   Difficult to place patient No    Barriers to Discharge: Ongoing encephalopathy-intractable seizures-LTM EEG in progress-not stable for discharge.  Antimicrobial agents: Anti-infectives (From admission, onward)    Start     Dose/Rate Route Frequency Ordered Stop   12/22/20 1300  ampicillin-sulbactam (UNASYN) 1.5 g in sodium chloride 0.9 % 100 mL IVPB        1.5 g 200 mL/hr over 30 Minutes Intravenous Every 8 hours 12/22/20 1202     12/21/20 1900  azithromycin (ZITHROMAX) 500 mg in sodium chloride 0.9 % 250 mL IVPB  Status:  Discontinued        500 mg 250 mL/hr over 60 Minutes Intravenous Every 24 hours 12/21/20 1805 12/22/20 1058   12/21/20 1730  cefTRIAXone (ROCEPHIN) 1 g in sodium chloride 0.9 % 100 mL IVPB  Status:  Discontinued        1 g 200 mL/hr over 30 Minutes Intravenous Every 24 hours 12/21/20 1633 12/22/20 1058   12/21/20 1730  azithromycin (ZITHROMAX) tablet 500 mg  Status:  Discontinued        500 mg Oral Daily 12/21/20 1633 12/21/20 1804        Time spent: 35 minutes-Greater than 50% of this time was spent in counseling, explanation of diagnosis, planning of further management, and coordination of care.  MEDICATIONS: Scheduled Meds:  allopurinol  200 mg Oral Daily   aspirin EC  81 mg Oral QPM   enoxaparin (LOVENOX) injection  30 mg Subcutaneous Q24H   levothyroxine  137 mcg Oral Q0600   metoprolol succinate  75 mg Oral Daily   sertraline  50 mg Oral Daily   Continuous Infusions:  ampicillin-sulbactam (UNASYN) IV 1.5 g (12/24/20 0540)   dextrose 5 % and 0.45%  NaCl     lacosamide (VIMPAT) IV 150 mg (12/24/20 1026)   valproate sodium 500 mg (12/23/20 2353)   And   valproate sodium 250 mg (12/23/20 1722)   PRN Meds:.acetaminophen **OR** acetaminophen, albuterol, colchicine, meclizine, midazolam, ondansetron **OR** ondansetron (ZOFRAN) IV   LABORATORY DATA: CBC: Recent Labs  Lab 12/17/20 2048 12/19/20 0308 12/22/20 0404  12/23/20 0302 12/24/20 0255 12/24/20 0909  WBC 5.7 6.7 4.5 5.6 6.4  --   NEUTROABS 4.2  --   --   --   --   --   HGB 15.6 15.5 17.6* 16.6 16.8 16.3  HCT 45.7 45.4 54.0* 50.6 52.4* 48.0  MCV 94.8 94.2 96.1 96.4 96.1  --   PLT 165 167 PLATELET CLUMPS NOTED ON SMEAR, UNABLE TO ESTIMATE 127* 159  --      Basic Metabolic Panel: Recent Labs  Lab 12/20/20 0316 12/21/20 0643 12/22/20 0404 12/23/20 0302 12/24/20 0255 12/24/20 0909  NA 139 140 143 144 147* 151*  K 5.1 4.5 5.3* 4.9 4.3 4.1  CL 107 106 103 112* 115*  --   CO2 22 27 23  21* 20*  --   GLUCOSE 90 93 116* 129* 174*  --   BUN 46* 42* 47* 44* 44*  --   CREATININE 1.97* 2.07* 2.02* 2.12* 2.14*  --   CALCIUM 9.2 9.1 9.3 8.8* 9.1  --   MG 2.5*  --   --   --   --   --      GFR: Estimated Creatinine Clearance: 29.6 mL/min (A) (by C-G formula based on SCr of 2.14 mg/dL (H)).  Liver Function Tests: Recent Labs  Lab 12/17/20 2048 12/23/20 0302 12/24/20 0255  AST 17 41 27  ALT 12 35 26  ALKPHOS 48 44 45  BILITOT 0.6 0.8 0.6  PROT 6.5 6.0* 6.2*  ALBUMIN 3.7 3.0* 2.9*    No results for input(s): LIPASE, AMYLASE in the last 168 hours. Recent Labs  Lab 12/17/20 2048 12/21/20 0914 12/23/20 0917  AMMONIA 18 42* 32     Coagulation Profile: No results for input(s): INR, PROTIME in the last 168 hours.   Cardiac Enzymes: No results for input(s): CKTOTAL, CKMB, CKMBINDEX, TROPONINI in the last 168 hours.  BNP (last 3 results) No results for input(s): PROBNP in the last 8760 hours.  Lipid Profile: No results for input(s): CHOL, HDL, LDLCALC, TRIG, CHOLHDL, LDLDIRECT in the last 72 hours.  Thyroid Function Tests: No results for input(s): TSH, T4TOTAL, FREET4, T3FREE, THYROIDAB in the last 72 hours.  Anemia Panel: No results for input(s): VITAMINB12, FOLATE, FERRITIN, TIBC, IRON, RETICCTPCT in the last 72 hours.  Urine analysis:    Component Value Date/Time   COLORURINE YELLOW 12/22/2020 0340   APPEARANCEUR  CLEAR 12/22/2020 0340   LABSPEC 1.020 12/22/2020 0340   PHURINE 6.0 12/22/2020 0340   GLUCOSEU NEGATIVE 12/22/2020 0340   GLUCOSEU NEGATIVE 10/12/2020 1118   HGBUR LARGE (A) 12/22/2020 0340   BILIRUBINUR NEGATIVE 12/22/2020 0340   KETONESUR NEGATIVE 12/22/2020 0340   PROTEINUR 100 (A) 12/22/2020 0340   UROBILINOGEN 0.2 10/12/2020 1118   NITRITE NEGATIVE 12/22/2020 0340   LEUKOCYTESUR NEGATIVE 12/22/2020 0340    Sepsis Labs: Lactic Acid, Venous    Component Value Date/Time   LATICACIDVEN 0.8 01/03/2019 0046    MICROBIOLOGY: Recent Results (from the past 240 hour(s))  Resp Panel by RT-PCR (Flu A&B, Covid) Nasopharyngeal Swab     Status: None   Collection Time: 12/15/20  1:03 PM  Specimen: Nasopharyngeal Swab; Nasopharyngeal(NP) swabs in vial transport medium  Result Value Ref Range Status   SARS Coronavirus 2 by RT PCR NEGATIVE NEGATIVE Final    Comment: (NOTE) SARS-CoV-2 target nucleic acids are NOT DETECTED.  The SARS-CoV-2 RNA is generally detectable in upper respiratory specimens during the acute phase of infection. The lowest concentration of SARS-CoV-2 viral copies this assay can detect is 138 copies/mL. A negative result does not preclude SARS-Cov-2 infection and should not be used as the sole basis for treatment or other patient management decisions. A negative result may occur with  improper specimen collection/handling, submission of specimen other than nasopharyngeal swab, presence of viral mutation(s) within the areas targeted by this assay, and inadequate number of viral copies(<138 copies/mL). A negative result must be combined with clinical observations, patient history, and epidemiological information. The expected result is Negative.  Fact Sheet for Patients:  EntrepreneurPulse.com.au  Fact Sheet for Healthcare Providers:  IncredibleEmployment.be  This test is no t yet approved or cleared by the [place] FDA and   has been authorized for detection and/or diagnosis of SARS-CoV-2 by FDA under an Emergency Use Authorization (EUA). This EUA will remain  in effect (meaning this test can be used) for the duration of the COVID-19 declaration under Section 564(b)(1) of the Act, 21 U.S.C.section 360bbb-3(b)(1), unless the authorization is terminated  or revoked sooner.       Influenza A by PCR NEGATIVE NEGATIVE Final   Influenza B by PCR NEGATIVE NEGATIVE Final    Comment: (NOTE) The Xpert Xpress SARS-CoV-2/FLU/RSV plus assay is intended as an aid in the diagnosis of influenza from Nasopharyngeal swab specimens and should not be used as a sole basis for treatment. Nasal washings and aspirates are unacceptable for Xpert Xpress SARS-CoV-2/FLU/RSV testing.  Fact Sheet for Patients: EntrepreneurPulse.com.au  Fact Sheet for Healthcare Providers: IncredibleEmployment.be  This test is not yet approved or cleared by the [place] FDA and has been authorized for detection and/or diagnosis of SARS-CoV-2 by FDA under an Emergency Use Authorization (EUA). This EUA will remain in effect (meaning this test can be used) for the duration of the COVID-19 declaration under Section 564(b)(1) of the Act, 21 U.S.C. section 360bbb-3(b)(1), unless the authorization is terminated or revoked.  Performed at [facility], [place], [place] [address]   Resp Panel by RT-PCR (Flu A&B, Covid) Nasopharyngeal Swab     Status: None   Collection Time: 12/21/20  4:34 PM   Specimen: Nasopharyngeal Swab; Nasopharyngeal(NP) swabs in vial transport medium  Result Value Ref Range Status   SARS Coronavirus 2 by RT PCR NEGATIVE NEGATIVE Final    Comment: (NOTE) SARS-CoV-2 target nucleic acids are NOT DETECTED.  The SARS-CoV-2 RNA is generally detectable in upper respiratory specimens during the acute phase of infection. The lowest concentration of SARS-CoV-2 viral  copies this assay can detect is 138 copies/mL. A negative result does not preclude SARS-Cov-2 infection and should not be used as the sole basis for treatment or other patient management decisions. A negative result may occur with  improper specimen collection/handling, submission of specimen other than nasopharyngeal swab, presence of viral mutation(s) within the areas targeted by this assay, and inadequate number of viral copies(<138 copies/mL). A negative result must be combined with clinical observations, patient history, and epidemiological information. The expected result is Negative.  Fact Sheet for Patients:  EntrepreneurPulse.com.au  Fact Sheet for Healthcare Providers:  IncredibleEmployment.be  This test is no t yet approved or cleared by the  [place] Architectural technologist and  has been authorized for detection and/or diagnosis of SARS-CoV-2 by FDA under an Print production planner (EUA). This EUA will remain  in effect (meaning this test can be used) for the duration of the COVID-19 declaration under Section 564(b)(1) of the Act, 21 U.S.C.section 360bbb-3(b)(1), unless the authorization is terminated  or revoked sooner.       Influenza A by PCR NEGATIVE NEGATIVE Final   Influenza B by PCR NEGATIVE NEGATIVE Final    Comment: (NOTE) The Xpert Xpress SARS-CoV-2/FLU/RSV plus assay is intended as an aid in the diagnosis of influenza from Nasopharyngeal swab specimens and should not be used as a sole basis for treatment. Nasal washings and aspirates are unacceptable for Xpert Xpress SARS-CoV-2/FLU/RSV testing.  Fact Sheet for Patients: EntrepreneurPulse.com.au  Fact Sheet for Healthcare Providers: IncredibleEmployment.be  This test is not yet approved or cleared by the [place] FDA and has been authorized for detection and/or diagnosis of SARS-CoV-2 by FDA under an Emergency Use Authorization (EUA). This  EUA will remain in effect (meaning this test can be used) for the duration of the COVID-19 declaration under Section 564(b)(1) of the Act, 21 U.S.C. section 360bbb-3(b)(1), unless the authorization is terminated or revoked.  Performed at [facility], [place] [address], [place], [place] [address]   MRSA Next Gen by PCR, Nasal     Status: Abnormal   Collection Time: 12/21/20  4:36 PM   Specimen: Nasopharyngeal Swab; Nasal Swab  Result Value Ref Range Status   MRSA by PCR Next Gen NEGATIVE (A) NOT DETECTED Final    Comment:        The GeneXpert MRSA Assay (FDA approved for NASAL specimens only), is one component of a comprehensive MRSA colonization surveillance program. It is not intended to diagnose MRSA infection nor to guide or monitor treatment for MRSA infections. Performed at [facility], [place] [address], [place], [place] [address]   Culture, blood (routine x 2)     Status: Abnormal   Collection Time: 12/21/20  5:13 PM   Specimen: BLOOD RIGHT HAND  Result Value Ref Range Status   Specimen Description BLOOD RIGHT HAND  Final   Special Requests   Final    BOTTLES DRAWN AEROBIC ONLY Blood Culture results may not be optimal due to an inadequate volume of blood received in culture bottles   Culture  Setup Time   Final    GRAM POSITIVE COCCI IN CLUSTERS AEROBIC BOTTLE ONLY CRITICAL RESULT CALLED TO, READ BACK BY AND VERIFIED WITH: PHARMD ALEX L 1305 [id number] FCP    Culture (A)  Final    STAPHYLOCOCCUS EPIDERMIDIS THE SIGNIFICANCE OF ISOLATING THIS ORGANISM FROM A SINGLE SET OF BLOOD CULTURES WHEN MULTIPLE SETS ARE DRAWN IS UNCERTAIN. PLEASE NOTIFY THE MICROBIOLOGY DEPARTMENT WITHIN ONE WEEK IF SPECIATION AND SENSITIVITIES ARE REQUIRED. Performed at [facility], [place] [address] [place], [place] [address]    Report Status 12/24/2020 FINAL  Final  Blood Culture ID Panel (Reflexed)     Status: Abnormal   Collection Time: 12/21/20  5:13 PM  Result Value Ref Range  Status   Enterococcus faecalis NOT DETECTED NOT DETECTED Final   Enterococcus Faecium NOT DETECTED NOT DETECTED Final   Listeria monocytogenes NOT DETECTED NOT DETECTED Final   Staphylococcus species DETECTED (A) NOT DETECTED Final    Comment: CRITICAL RESULT CALLED TO, READ BACK BY AND VERIFIED WITH: PHARMD ALEX L 1305 [id number] FCP    Staphylococcus aureus (BCID)  NOT DETECTED NOT DETECTED Final   Staphylococcus epidermidis DETECTED (A) NOT DETECTED Final    Comment: Methicillin (oxacillin) resistant coagulase negative staphylococcus. Possible blood culture contaminant (unless isolated from more than one blood culture draw or clinical case suggests pathogenicity). No antibiotic treatment is indicated for blood  culture contaminants. CRITICAL RESULT CALLED TO, READ BACK BY AND VERIFIED WITH: PHARMD ALEX L 1305 [id number] FCP    Staphylococcus lugdunensis NOT DETECTED NOT DETECTED Final   Streptococcus species NOT DETECTED NOT DETECTED Final   Streptococcus agalactiae NOT DETECTED NOT DETECTED Final   Streptococcus pneumoniae NOT DETECTED NOT DETECTED Final   Streptococcus pyogenes NOT DETECTED NOT DETECTED Final   A.calcoaceticus-baumannii NOT DETECTED NOT DETECTED Final   Bacteroides fragilis NOT DETECTED NOT DETECTED Final   Enterobacterales NOT DETECTED NOT DETECTED Final   Enterobacter cloacae complex NOT DETECTED NOT DETECTED Final   Escherichia coli NOT DETECTED NOT DETECTED Final   Klebsiella aerogenes NOT DETECTED NOT DETECTED Final   Klebsiella oxytoca NOT DETECTED NOT DETECTED Final   Klebsiella pneumoniae NOT DETECTED NOT DETECTED Final   Proteus species NOT DETECTED NOT DETECTED Final   Salmonella species NOT DETECTED NOT DETECTED Final   Serratia marcescens NOT DETECTED NOT DETECTED Final   Haemophilus influenzae NOT DETECTED NOT DETECTED Final   Neisseria meningitidis NOT DETECTED NOT DETECTED Final   Pseudomonas aeruginosa NOT DETECTED NOT DETECTED Final   Stenotrophomonas  maltophilia NOT DETECTED NOT DETECTED Final   Candida albicans NOT DETECTED NOT DETECTED Final   Candida auris NOT DETECTED NOT DETECTED Final   Candida glabrata NOT DETECTED NOT DETECTED Final   Candida krusei NOT DETECTED NOT DETECTED Final   Candida parapsilosis NOT DETECTED NOT DETECTED Final   Candida tropicalis NOT DETECTED NOT DETECTED Final   Cryptococcus neoformans/gattii NOT DETECTED NOT DETECTED Final   Methicillin resistance mecA/C DETECTED (A) NOT DETECTED Final    Comment: CRITICAL RESULT CALLED TO, READ BACK BY AND VERIFIED WITH: PHARMD ALEX L 0998 [id number] FCP Performed at [facility], [address] [place], [place] [address]   Culture, blood (routine x 2)     Status: None (Preliminary result)   Collection Time: 12/21/20  5:45 PM   Specimen: BLOOD RIGHT ARM  Result Value Ref Range Status   Specimen Description BLOOD RIGHT ARM  Final   Special Requests   Final    BOTTLES DRAWN AEROBIC ONLY Blood Culture adequate volume   Culture   Final    NO GROWTH 3 DAYS Performed at [facility], [place] [address], [place], [place] [address]    Report Status PENDING  Incomplete  Urine Culture     Status: Abnormal   Collection Time: 12/22/20  3:40 AM   Specimen: Urine, Clean Catch  Result Value Ref Range Status   Specimen Description URINE, CLEAN CATCH  Final   Special Requests NONE  Final   Culture (A)  Final    <10,000 COLONIES/mL INSIGNIFICANT GROWTH Performed at [facility], [place] [address], [place],  [address]    Report Status 12/23/2020 FINAL  Final    RADIOLOGY STUDIES/RESULTS: Overnight EEG with video  Result Date: 12/23/2020 Lora Havens, MD     12/23/2020  3:55 PM Patient Name: [name] MRN: [id number] Epilepsy Attending: Lora Havens Referring Physician/Provider: Dr Zeb Comfort Duration: 12/22/2020 1133 to 12/23/2020 1133  Patient history: 80 year old male with history of CVA, ICH, melanoma with mets to lung and brain  who presented  with aphasia.  EEG to evaluate for seizures.  Level of alertness: Awake, asleep  AEDs during EEG study: LEV, VPA, Onfi, Vimpat  Technical aspects: This EEG study was done with scalp electrodes positioned according to the 10-20 International system of electrode placement. Electrical activity was acquired at a sampling rate of 500Hz  and reviewed with a high frequency filter of 70Hz  and a low frequency filter of 1Hz . EEG data were recorded continuously and digitally stored.  Description: The posterior dominant rhythm consists of 8 Hz activity of moderate voltage (25-35 uV) seen predominantly in posterior head regions, symmetric and reactive to eye opening and eye closing. Sleep was characterized by vertex waves, sleep spindles (12-14Hz ), maximal fronto-central region. EEG also showed continuous 3 to 5 Hz sharply contoured theta-delta slowing with overriding 15 to 18 Hz beta activity in left frontal region consistent with prior craniotomy.  Sharp waves were seen in left frontal region.   ABNORMALITY -Sharp wave, left frontal region -Breach artifact, left frontal region  IMPRESSION: This study showed evidence of epileptogenicity and cortical dysfunction in left frontal region consistent with prior craniotomy. No seizures were seen during the study.  Lora Havens     LOS: 6 days   Oren Binet, MD  [facility]    To contact the attending provider between 7A-7P or the covering provider during after hours 7P-7A, please log into the web site [email or web address] and access using universal [place] password for that web site. If you do not have the password, please call the hospital operator.  12/24/2020, 11:11 AM

## 2020-12-24 NOTE — Progress Notes (Addendum)
Pharmacy Antibiotic Note  David [name]. is a 80 y.o. male admitted on 12/21/2020 with AMS. Pharmacy has been consulted for Unasyn dosing.  Patient initially presented to [facility] with aphasia and was found to have EEG evidence of seizures. Subsequent hospital course has been complicated by encephalopathy and fever.  Unasyn continues for suspicion for aspiration pneumonia,  empirically antibiotics de-escalated to Unasyn.   Scr remains elevated , trending up , 2.14 today.   (CrCL ~29.6 mL/min). Will adjust Unasyn dose ofr Crcl < 30 ml/min.   Dr. Sloan Leiter consulted pharmacy 9/16 to start vancomycin for coag neg staph bacteremia at continue Vanc until repeat blood cultures results back then will re-evaluate.  Plan: Vancomycin 1750 mg x1 then 1250 mg IV Q 48 hrs. Goal AUC 400-550. Expected AUC: 527,  used Vd 0.5 L/kg (BMI =30) SCr used: 2.14 Adjust Unasyn 3 g IV q12h, (plan total 5 days including Ceftriaxone given on 9/13, thus 4 days of unasyn per Dr.Ghimire, ends 9/17 PM). Monitor clinical status, renal function and culture results daily.  Check Vanc levels at steady state.  Temp (24hrs), Avg:98.9 F (37.2 C), Min:97.7 F (36.5 C), Max:100.1 F (37.8 C)  Recent Labs  Lab 12/17/20 2048 12/19/20 0308 12/20/20 0316 12/21/20 0643 12/22/20 0404 12/23/20 0302 12/24/20 0255  WBC 5.7 6.7  --   --  4.5 5.6 6.4  CREATININE 1.85* 1.93* 1.97* 2.07* 2.02* 2.12* 2.14*     Estimated Creatinine Clearance: 29.6 mL/min (A) (by C-G formula based on SCr of 2.14 mg/dL (H)).    Allergies  Allergen Reactions   Ezetimibe Other (See Comments)    Unknown reaction   Simvastatin Other (See Comments)    Reaction:  Gave pt a fever  Fever - temp of 103.   In 2003    Antimicrobials this admission: Ceftriaxone 9/13 x1 Unasyn 9/14 >> Vanc 9/16>>   Microbiology results: 9/13 MRSA PCR: negative 9/13 Blood cx x2:  1/4 GPC =Staph epidermidis/ Final  9/14 Ucx: insig / final 9/16 Blood cx: x2:  sent    Thank you for allowing pharmacy to be a part of this patient's care.   Nicole Cella, RPh Clinical Pharmacist [phone] 12/24/2020 1:12 PM Please check AMION for all [place] phone numbers After 10:00 PM, call [place] [phone]

## 2020-12-24 NOTE — Procedures (Signed)
The L3-4 interspace as accessed with a 20 gauge needle without difficulty, yielding 11 cc of clear CSF. The patient tolerated the procedure well.  Orders placed in Epic.

## 2020-12-24 NOTE — Care Plan (Signed)
Saw patient with Dr Leonie Man and Dr Sloan Leiter. The cerebellar stroke alone doesn't explain patient's mental status. His prior CTA in 2017 showed Both vertebral arteries patent to the basilar. Left vertebral dominant. PICA patent bilaterally. Basilar patent. Superior cerebellar and posterior cerebral arteries patent bilaterally without stenosis.  Concern for possible basilar thrombosis discussed with patient's daughter Dr [name]. Plan to obtain CTA head and neck with contrast.   Of note, we discussed his renal dysfunction and how CT contrast will most likely worsen his renal function. Daughter understands the risk and consents for the administration of CT contrast.  Maegan Buller [name]

## 2020-12-24 NOTE — Progress Notes (Signed)
PT Cancellation Note  Patient Details Name: David [name]. MRN: [id number] DOB: 12/31/1940   Cancelled Treatment:    Reason Eval/Treat Not Completed: (P) Medical issues which prohibited therapy (pt leaving for CT scan and plan for LP afterward per RN.) Will continue efforts per PT POC as schedule permits.   [name] 12/24/2020, 12:22 PM

## 2020-12-24 NOTE — Procedures (Signed)
LUMBAR PUNCTURE (SPINAL TAP) PROCEDURE NOTE  Indication: meningitis, carcinomatous meningitis   Proceduralists: Dr Zeb Comfort  Time of procedure: 1530   Risks of the procedure were dicussed with the patient including post-LP headache, bleeding, infection, weakness/numbness of legs(radiculopathy), death.    Consent obtained from: relative ( daughter [name])   Procedure Note The patient was prepped and draped, and using sterile technique a 20 gauge quinke spinal needle was inserted in the L4-5 space.   The procedure was unsuccessful and no CSF was obtained.  Patient tolerated the procedure well and blood loss was minimal.

## 2020-12-24 NOTE — Progress Notes (Signed)
  Echocardiogram 2D Echocardiogram has been performed.  David [name] 12/24/2020, 5:07 PM

## 2020-12-24 NOTE — Procedures (Signed)
Cortrak  Person Inserting Tube:  Sheron Robin, RD Tube Type:  Cortrak - 43 inches Tube Size:  10 Tube Location:  Right nare Initial Placement:  Stomach Secured by: Bridle Technique Used to Measure Tube Placement:  Marking at nare/corner of mouth Cortrak Secured At:  75 cm Procedure Comments:  Cortrak Tube Team Note:  Consult received to place a Cortrak feeding tube.   X-ray is required, abdominal x-ray has been ordered by the Cortrak team. Please confirm tube placement before using the Cortrak tube.   If the tube becomes dislodged please keep the tube and contact the Cortrak team at [email or web address] ([email or web address]) for replacement.  If after hours and replacement cannot be delayed, place a NG tube and confirm placement with an abdominal x-ray.    Mariana Single MS, RD, LDN, CNSC Clinical Nutrition Pager listed in [place]

## 2020-12-24 NOTE — Progress Notes (Addendum)
Stroke Team Progress Note  SUBJECTIVE Stroke team was requested by Dr. Sloan Leiter and Hortense Ramal   to see this patient.  He was admitted with progressive problems of confusion and aphasia for last 1 week.  He was found to have seizures emanating from the left temporal central region for which she was placed on long-term EEG monitoring requiring multiple EEG showing focal seizures and finally seizures were broken with using multiple agents.   Patient apparently was more interactive till yesterday.  He has been found to be obtunded and less responsive.  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  He had CT scan of the head done this morning which shows right cerebellar and brachium pontis low-density consistent with subacute infarct. He has a pacemaker which is MRI incompatible and due to his serum creatinine of 2.0 without concerns about doing CT angio and worsening his renal function   OBJECTIVE Most recent Vital Signs: BP: 146/94 (09/16 1600) Pulse Rate: 76 (09/16 1600) Respiratory Rate: 18 O2 Saturdation: 96%  CBG (last 3)  No results for input(s): GLUCAP in the last 72 hours.     Studies:  CT head low-density involving right brachium pontis and adjacent cerebellum suspicious for acute or subacute infarction. No acute intracranial hemorrhage.  Physical Exam:   Frail malnourished looking elderly Caucasian male not in distress. . Afebrile. Head is nontraumatic. Neck is supple without bruit.    Cardiac exam no murmur or gallop. Lungs are clear to auscultation. Distal pulses are well felt.  Neurological Exam : Patient is stuporous.  Eyes are closed.  Skew eye deviation with left eye hypotrophic and slightly exotropic.  Pupils 3 to 4 mm briskly reactive.  Doll's eye movements are sluggish.  Fundi not visualized.  He barely responds to sternal rub with partially opening his eyes but cannot sustain attention.  He mumbles a few words which mostly  nonsensical with not able to follow any commands or speak.  He does have some semipurposeful left upper extremity movement and will reach out to you.  He does withdraw the left leg.  He has right hemiparesis with barely trace withdrawal to painful stimulus. ASSESSMENT David [name]. is a 80 y.o. male with   prior history of melanoma with mets to left frontal lobe complicated by hemorrhage in 2017 status postresection, bone mets, coronary artery disease, CKD, depression, anxiety, hypertension and hyperlipidemia who presented with progressive aphasia and confusion.  EEG showed seizures arising from left central temporal region which has been difficult to control with escalating doses of AEDs. New depressed sensorium and worsening exam the last 2 days with CT scan showing new right cerebellar infarct.  Hospital day # 6  TREATMENT/PLAN Patient has had a significant neurological decline in his exam over the last 24 hours and i his depressed mental status along with skew eye deviation and increased right-sided weakness suggests possibility significant brainstem ischemia given recent right cerebellar stroke.  Recommend emergent neurovascular imaging with CT angiogram of brain and neck to rule out basilar artery occlusion which could be reversed stating.  Patient does have elevated creatinine missing available artery occlusion which could potentially be treated with endovascular treatment is much worse than worsening renal function with that he needs for the procedure.  Had a long discussion with patient daughter Dr. Silas [place] about risk benefit of worsening renal function with dye for CT angio versus missing basilar occlusion and unfortunately treatment. Stat CT angiogram of the brain  and neck was obtained I personally reviewed the images which did not show significant dominant left vertebral or basilar artery occlusion.  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    Rt cerebellar infarct etiology unclear likely cardioembolic from PAF not detected . May need outpt cardiac monitoring for AF if not found during this admission. Patient`s  depressed mental status is likely going to be effect of clobazam and other anticonvulsants that he had an anticipated slow improvement in mental status in the next few days. Doubt meningitis carcinomatous or other Recommend core track tube for feeding patient may not be able to eat for the next 2 days.  Discussed with Dr. [name] who agrees.  Continue aspirin for stroke prevention.  Check echocardiogram.,  Hemoglobin A1c and lipid profile  Management options seizures as per Dr. Hortense Ramal Discussed with Dr. Sloan Leiter and Hortense Ramal  This patient is critically ill and at significant risk of neurological worsening, death and care requires constant monitoring of vital signs, hemodynamics,respiratory and cardiac monitoring, extensive review of multiple databases, frequent neurological assessment, discussion with family, other specialists and medical decision making of high complexity.I have made any additions or clarifications directly to the above note.This critical care time does not reflect procedure time, or teaching time or supervisory time of PA/NP/Med Resident etc but could involve care discussion time.  I spent 50 minutes of neurocritical care time  in the care of  this patient.      Antony Contras, MD Medical Director [facility] Stroke [facility] Pager: [phone] 12/24/2020 5:25 PM

## 2020-12-24 NOTE — Procedures (Addendum)
Patient Name: David [name].  MRN: [id number]  Epilepsy Attending: Lora Havens  Referring Physician/Provider: Dr Zeb Comfort Duration: 12/23/2020 1133 to 12/24/2020 1133   Patient history: 80 year old male with history of CVA, ICH, melanoma with mets to lung and brain who presented with aphasia.  EEG to evaluate for seizures.   Level of alertness: Awake, asleep   AEDs during EEG study: LEV, VPA, Vimpat   Technical aspects: This EEG study was done with scalp electrodes positioned according to the 10-20 International system of electrode placement. Electrical activity was acquired at a sampling rate of 500Hz  and reviewed with a high frequency filter of 70Hz  and a low frequency filter of 1Hz . EEG data were recorded continuously and digitally stored.    Description: The posterior dominant rhythm consists of 8 Hz activity of moderate voltage (25-35 uV) seen predominantly in posterior head regions, symmetric and reactive to eye opening and eye closing. Sleep was characterized by vertex waves, sleep spindles (12-14Hz ), maximal fronto-central region. EEG also showed continuous 3 to 5 Hz sharply contoured theta-delta slowing with overriding 15 to 18 Hz beta activity in left frontal region consistent with prior craniotomy.  Abundant qasi periodic sharp waves were seen in left frontal region.   One seizures without clinical signs was noted arising from left fronto-central region on 12/23/2020 1357, lasted about 15 seconds. EEG initially showed 3-5Hz  theta-delta slowing which gradually evolved in morphology and appeared sharply contoured and evolved in frequency to 15-18Hz  beta activity.      ABNORMALITY -Seizure without clinical signs, left frontocentral region -Sharp wave, left frontal region -Breach artifact, left frontal region   IMPRESSION: This study showed one seizures arising from left frontocentral region on 12/23/2020 1357, lasted about 15 seconds. There is also evidence of  epileptogenicity and cortical dysfunction in left frontal region consistent with prior craniotomy.     Siyah Mault [name]

## 2020-12-24 NOTE — Progress Notes (Signed)
Patient on continuous EEG with cortrak in place, RT unable to place CPAP at this time.

## 2020-12-24 NOTE — Progress Notes (Addendum)
Subjective: No clinical seizures overnight.  Afebrile.  However, still excessively lethargic, only moaning to noxious stimuli.  ROS: Unable to obtain due to poor mental status  Examination  Vital signs in last 24 hours: Temp:  [97.7 F (36.5 C)-100.1 F (37.8 C)] 97.7 F (36.5 C) (09/16 0440) Pulse Rate:  [78-91] 88 (09/16 0900) Resp:  [14-22] 18 (09/16 1030) BP: (137-164)/(87-100) 142/100 (09/16 0900) SpO2:  [90 %-98 %] 90 % (09/16 0900)  General: lying in bed, NAD CVS: pulse-normal rate and rhythm RS: breathing comfortably, coarse breath sounds bilaterally Extremities: normal, warm    Neuro: MS: Does not open eyes to noxious stimuli, only moans to noxious stimuli  CN: pupils equal and reactive, EOMI, face symmetric Motor: Withdraws to noxious stimuli left upper and lower extremity, barely withdraws with right upper and lower extremity,  Basic Metabolic Panel: Recent Labs  Lab 12/20/20 0316 12/21/20 0643 12/22/20 0404 12/23/20 0302 12/24/20 0255 12/24/20 0909  NA 139 140 143 144 147* 151*  K 5.1 4.5 5.3* 4.9 4.3 4.1  CL 107 106 103 112* 115*  --   CO2 22 27 23  21* 20*  --   GLUCOSE 90 93 116* 129* 174*  --   BUN 46* 42* 47* 44* 44*  --   CREATININE 1.97* 2.07* 2.02* 2.12* 2.14*  --   CALCIUM 9.2 9.1 9.3 8.8* 9.1  --   MG 2.5*  --   --   --   --   --     CBC: Recent Labs  Lab 12/17/20 2048 12/19/20 0308 12/22/20 0404 12/23/20 0302 12/24/20 0255 12/24/20 0909  WBC 5.7 6.7 4.5 5.6 6.4  --   NEUTROABS 4.2  --   --   --   --   --   HGB 15.6 15.5 17.6* 16.6 16.8 16.3  HCT 45.7 45.4 54.0* 50.6 52.4* 48.0  MCV 94.8 94.2 96.1 96.4 96.1  --   PLT 165 167 PLATELET CLUMPS NOTED ON SMEAR, UNABLE TO ESTIMATE 127* 159  --      Coagulation Studies: No results for input(s): LABPROT, INR in the last 72 hours.  Imaging CT head without contrast ordered and pending  ASSESSMENT AND PLAN: 80 year old male with prior history of melanoma with mets to left frontal lobe  complicated by hemorrhage in 2017 status postresection, bone mets, coronary artery disease, CKD, depression, anxiety, hypertension and hyperlipidemia who presented with progressive aphasia and confusion.  EEG showed seizures arising from left central temporal region which has been difficult to control with escalating doses of AEDs.   Focal seizures with alteration of awareness ( improved) Metastatic melanoma with brain mets Acute toxic-metabolic encephalopathy  Hyperammonemia due to Depakote toxicity ( resolved) Right hemiparesis (POA) - LTM EEG showed 1 brief 15-second seizure overnight. -Worsening encephalopathy likely due to medication, hypernatremia.  Also discussed with Dr. Sloan Leiter, no clear infectious etiology   Recommendations -Discontinue Onfi to minimize sedation. Onfi has long half life of about 40 hours so it will likely take a couple of days for patient to be more awake -Reduce Keppra to 500 mg twice daily (renally adjusted) -Continue Depakote 500 mg twice daily and 250 mg in the afternoon and Vimpat 150 mg twice daily -Discontinue trazodone to minimize sedation -We will repeat CT head without contrast to look for any acute abnormality.  Unfortunately, cannot administer contrast due to renal dysfunction and cannot obtain MRI brain due to pacemaker that is incompatible -We will also plan to obtain lumbar puncture after CT  head to look for carcinomatous meningitis although suspicion is low -Continue video EEG to assess for seizure frequency as we are reducing AEDs -If patient has sz recurrence, will increase Vimpat to 200 mg twice daily -Continue seizure precautions -Continue delirium precautions, aspiration precautions - as needed IV Versed 2 mg for clinical seizure-like activity lasting more than 15 minutes -Management of rest of comorbidities per primary team -Discussed my plan with patient's daughter and Dr Rollen Sox -CT had without contrast showed acute to subacute  right cerebellar stroke.  -tPA was not administered because of unclear last known normal -Will discuss case with stroke attending Dr. Leonie Man and order further work-up.    - CTA head and neck was obtained to rule out vascular thrombosis which did not show any large vessel occlusion.  Echo ordered to look for cardioembolic source of stroke -Lumbar puncture was also attempted at bedside to rule out infectious meningitis versus carcinomatous meningitis.  Unfortunately, it was unsuccessful.  Called and discussed with Dr.Wile in radiology to perform fluoroscopy guided LP.  Will likely happen later today.  CSF orders placed.    CRITICAL CARE Performed by: Lora Havens   Total critical care time: 75 minutes  Critical care time was exclusive of separately billable procedures and treating other patients.  Critical care was necessary to treat or prevent imminent or life-threatening deterioration.  Critical care was time spent personally by me on the following activities: development of treatment plan with patient and/or surrogate as well as nursing, discussions with consultants, evaluation of patient's response to treatment, examination of patient, obtaining history from patient or surrogate, ordering and performing treatments and interventions, ordering and review of laboratory studies, ordering and review of radiographic studies, pulse oximetry and re-evaluation of patient's condition.  Zeb Comfort Epilepsy [facility] For questions after 5pm please refer to AMION to reach the Neurologist on call

## 2020-12-25 ENCOUNTER — Inpatient Hospital Stay (HOSPITAL_COMMUNITY): Payer: Medicare Other

## 2020-12-25 DIAGNOSIS — I63341 Cerebral infarction due to thrombosis of right cerebellar artery: Secondary | ICD-10-CM

## 2020-12-25 DIAGNOSIS — I63441 Cerebral infarction due to embolism of right cerebellar artery: Secondary | ICD-10-CM

## 2020-12-25 DIAGNOSIS — G9340 Encephalopathy, unspecified: Secondary | ICD-10-CM

## 2020-12-25 DIAGNOSIS — I69151 Hemiplegia and hemiparesis following nontraumatic intracerebral hemorrhage affecting right dominant side: Secondary | ICD-10-CM

## 2020-12-25 LAB — PHOSPHORUS
Phosphorus: 2.6 mg/dL (ref 2.5–4.6)
Phosphorus: 3.2 mg/dL (ref 2.5–4.6)

## 2020-12-25 LAB — BASIC METABOLIC PANEL
Anion gap: 15 (ref 5–15)
BUN: 54 mg/dL — ABNORMAL HIGH (ref 8–23)
CO2: 17 mmol/L — ABNORMAL LOW (ref 22–32)
Calcium: 8.5 mg/dL — ABNORMAL LOW (ref 8.9–10.3)
Chloride: 118 mmol/L — ABNORMAL HIGH (ref 98–111)
Creatinine, Ser: 2.3 mg/dL — ABNORMAL HIGH (ref 0.61–1.24)
GFR, Estimated: 28 mL/min — ABNORMAL LOW (ref >60)
Glucose, Bld: 218 mg/dL — ABNORMAL HIGH (ref 70–99)
Potassium: 4.3 mmol/L (ref 3.5–5.1)
Sodium: 150 mmol/L — ABNORMAL HIGH (ref 135–145)

## 2020-12-25 LAB — AMMONIA: Ammonia: 52 umol/L — ABNORMAL HIGH (ref 9–35)

## 2020-12-25 LAB — MAGNESIUM
Magnesium: 2.4 mg/dL (ref 1.7–2.4)
Magnesium: 2.7 mg/dL — ABNORMAL HIGH (ref 1.7–2.4)

## 2020-12-25 LAB — CBC
HCT: 51 % (ref 39.0–52.0)
Hemoglobin: 16.6 g/dL (ref 13.0–17.0)
MCH: 31.6 pg (ref 26.0–34.0)
MCHC: 32.5 g/dL (ref 30.0–36.0)
MCV: 97 fL (ref 80.0–100.0)
Platelets: 150 10*3/uL (ref 150–400)
RBC: 5.26 MIL/uL (ref 4.22–5.81)
RDW: 14.4 % (ref 11.5–15.5)
WBC: 8.4 10*3/uL (ref 4.0–10.5)
nRBC: 0 % (ref 0.0–0.2)

## 2020-12-25 LAB — GLUCOSE, CAPILLARY
Glucose-Capillary: 179 mg/dL — ABNORMAL HIGH (ref 70–99)
Glucose-Capillary: 188 mg/dL — ABNORMAL HIGH (ref 70–99)
Glucose-Capillary: 213 mg/dL — ABNORMAL HIGH (ref 70–99)
Glucose-Capillary: 247 mg/dL — ABNORMAL HIGH (ref 70–99)
Glucose-Capillary: 270 mg/dL — ABNORMAL HIGH (ref 70–99)
Glucose-Capillary: 273 mg/dL — ABNORMAL HIGH (ref 70–99)
Glucose-Capillary: 282 mg/dL — ABNORMAL HIGH (ref 70–99)

## 2020-12-25 MED ORDER — VALPROIC ACID 250 MG/5ML PO SOLN
500.0000 mg | Freq: Two times a day (BID) | ORAL | Status: DC
  Administered 2020-12-25 – 2021-01-02 (×16): 500 mg
  Filled 2020-12-25 (×16): qty 10

## 2020-12-25 MED ORDER — INSULIN ASPART 100 UNIT/ML IJ SOLN: 0.0000 [IU] | Freq: Every day | INTRAMUSCULAR | Status: DC

## 2020-12-25 MED ORDER — INSULIN DETEMIR 100 UNIT/ML ~~LOC~~ SOLN
10.0000 [IU] | Freq: Two times a day (BID) | SUBCUTANEOUS | Status: DC
  Administered 2020-12-25 – 2021-01-01 (×14): 10 [IU] via SUBCUTANEOUS
  Filled 2020-12-25 (×16): qty 0.1

## 2020-12-25 MED ORDER — VALPROIC ACID 250 MG/5ML PO SOLN
250.0000 mg | ORAL | Status: DC
  Administered 2020-12-26 – 2021-01-01 (×7): 250 mg
  Filled 2020-12-25 (×10): qty 5

## 2020-12-25 MED ORDER — VANCOMYCIN HCL IN DEXTROSE 1-5 GM/200ML-% IV SOLN: 1000.0000 mg | INTRAVENOUS | Status: DC

## 2020-12-25 MED ORDER — INSULIN ASPART 100 UNIT/ML IJ SOLN
0.0000 [IU] | INTRAMUSCULAR | Status: DC
  Administered 2020-12-25: 5 [IU] via SUBCUTANEOUS
  Administered 2020-12-25: 7 [IU] via SUBCUTANEOUS
  Administered 2020-12-25: 3 [IU] via SUBCUTANEOUS
  Administered 2020-12-25: 8 [IU] via SUBCUTANEOUS
  Administered 2020-12-26 (×2): 3 [IU] via SUBCUTANEOUS
  Administered 2020-12-26: 8 [IU] via SUBCUTANEOUS
  Administered 2020-12-26: 3 [IU] via SUBCUTANEOUS
  Administered 2020-12-26: 5 [IU] via SUBCUTANEOUS
  Administered 2020-12-26 – 2020-12-27 (×6): 3 [IU] via SUBCUTANEOUS
  Administered 2020-12-27 – 2020-12-28 (×2): 5 [IU] via SUBCUTANEOUS
  Administered 2020-12-28 – 2020-12-29 (×6): 3 [IU] via SUBCUTANEOUS
  Administered 2020-12-29: 5 [IU] via SUBCUTANEOUS
  Administered 2020-12-29: 3 [IU] via SUBCUTANEOUS
  Administered 2020-12-29: 8 [IU] via SUBCUTANEOUS
  Administered 2020-12-29 (×3): 3 [IU] via SUBCUTANEOUS
  Administered 2020-12-30: 8 [IU] via SUBCUTANEOUS
  Administered 2020-12-30 (×3): 3 [IU] via SUBCUTANEOUS
  Administered 2020-12-30: 5 [IU] via SUBCUTANEOUS
  Administered 2020-12-31 (×3): 3 [IU] via SUBCUTANEOUS
  Administered 2020-12-31: 5 [IU] via SUBCUTANEOUS
  Administered 2021-01-01: 3 [IU] via SUBCUTANEOUS
  Administered 2021-01-01 (×2): 5 [IU] via SUBCUTANEOUS
  Administered 2021-01-01: 3 [IU] via SUBCUTANEOUS
  Administered 2021-01-01: 5 [IU] via SUBCUTANEOUS
  Administered 2021-01-02: 2 [IU] via SUBCUTANEOUS

## 2020-12-25 MED ORDER — INSULIN DETEMIR 100 UNIT/ML ~~LOC~~ SOLN
7.0000 [IU] | Freq: Every morning | SUBCUTANEOUS | Status: DC
  Administered 2020-12-25: 7 [IU] via SUBCUTANEOUS
  Filled 2020-12-25 (×2): qty 0.07

## 2020-12-25 MED ORDER — INSULIN ASPART 100 UNIT/ML IJ SOLN: 0.0000 [IU] | Freq: Three times a day (TID) | INTRAMUSCULAR | Status: DC

## 2020-12-25 MED ORDER — FREE WATER
300.0000 mL | Status: DC
  Administered 2020-12-25 – 2020-12-26 (×6): 300 mL

## 2020-12-25 MED ORDER — CLOPIDOGREL BISULFATE 75 MG PO TABS
75.0000 mg | ORAL_TABLET | Freq: Every day | ORAL | Status: DC
  Administered 2020-12-26: 75 mg via ORAL
  Filled 2020-12-25 (×2): qty 1

## 2020-12-25 MED ORDER — LEVETIRACETAM IN NACL 500 MG/100ML IV SOLN
500.0000 mg | INTRAVENOUS | Status: DC
  Administered 2020-12-25 – 2020-12-26 (×2): 500 mg via INTRAVENOUS
  Filled 2020-12-25 (×2): qty 100

## 2020-12-25 NOTE — Progress Notes (Signed)
Pharmacy Antibiotic Note  David [name]. is a 80 y.o. male admitted on 12/31/2020 with AMS. Pharmacy was been consulted on 12/22/20 for Vanc  and Unasyn. Vanc discontinued    dosing. Ccontinues for suspicion for aspiration pneumonia,  empirically antibiotics de-escalated to Unasyn continued> empirically antibiotics de-escalated to Unasyn for suspicion of aspiration PNA.  Patient initially presented to [facility] on 12/15/20 with aphasia and was found to have EEG evidence of seizures. Subsequent hospital course has been complicated by encephalopathy and fever.  Dr. Sloan Leiter consulted pharmacy 9/16 to start vancomycin for coag neg staph bacteremia at continue Vanc until repeat blood cultures results back then will re-evaluate. MD aware Staph likely contaminant.  Repeat Blood cultures 9/16:  no growth to date <24 hrs. Lumbar puncture done 9/16,  CSF gram stain  no growth to date <24 hrs.  Scr remains elevated , trending up , 2.30 today.   (CrCL ~27.5 mL/min). Unasyn dose was adjusted on 9/16 for Crcl < 30 ml/min.  Will need to adjust Vancomycin for SCr rise to 2.3  Plan: Adjust Vancomycin to 1000 mg IV Q 48 hrs. Goal AUC 400-550.  Expected AUC: 447,  used Vd 0.5 L/kg (BMI =30) SCr used: 2.3 Continue Unasyn 3 g IV q12h, (plan total 5 days including Ceftriaxone x1 9/13, thus ends 9/17 PM). Monitor clinical status, renal function daily and  follw up repeat blood culture and CSF gram stain.  Check Vanc levels at steady state.  Temp (24hrs), Avg:98.8 F (37.1 C), Min:98.1 F (36.7 C), Max:99.4 F (37.4 C)  Recent Labs  Lab 12/19/20 0308 12/20/20 0316 12/21/20 0643 12/22/20 0404 12/23/20 0302 12/24/20 0255 12/25/20 0605  WBC 6.7  --   --  4.5 5.6 6.4 8.4  CREATININE 1.93*   < > 2.07* 2.02* 2.12* 2.14* 2.30*   < > = values in this interval not displayed.     Estimated Creatinine Clearance: 27.5 mL/min (A) (by C-G formula based on SCr of 2.3 mg/dL (H)).    Allergies  Allergen Reactions    Ezetimibe Other (See Comments)    Unknown reaction   Simvastatin Other (See Comments)    Reaction:  Gave pt a fever  Fever - temp of 103.   In 2003    Antimicrobials this admission: Ceftriaxone 9/13 x1 Unasyn 9/14 >> Vanc 9/16>>   Microbiology results: 9/13 MRSA PCR: negative 9/13 Blood cx x2:  1/4 GPC =Staph epidermidis/ Final  9/14 Ucx: insig / final 9/16 Blood cx: x2: ngtd <24 hr 9/16 CSF gram stain: ngtd <24hr   Thank you for allowing pharmacy to be a part of this patient's care.   Nicole Cella, RPh Clinical Pharmacist [phone] 12/25/2020 10:26 AM Please check AMION for all [place] phone numbers After 10:00 PM, call [place] [phone]

## 2020-12-25 NOTE — Progress Notes (Addendum)
Subjective: No clinical seizures overnight.  Afebrile.  However, still excessively lethargic, only moaning to noxious stimuli.  ROS: Unable to obtain due to poor mental status  Examination  Vital signs in last 24 hours: Temp:  [98.1 F (36.7 C)-98.9 F (37.2 C)] 98.1 F (36.7 C) (09/17 0314) Pulse Rate:  [76-106] 96 (09/17 0330) Resp:  [14-22] 18 (09/17 0330) BP: (142-146)/(94-100) 144/95 (09/17 0314) SpO2:  [90 %-97 %] 95 % (09/17 0314)  General: lying in bed, NAD CVS: pulse-normal rate and rhythm RS: Mouth open breathing comfortably, coarse breath sounds bilaterally Extremities: normal, warm    Neuro: MS: Slightly opens eyes L>R and slightly raises his left hand and moans with sternal rub.  CN: pupils equal and reactive, EOMI, face symmetric Motor: Withdraws left upper and lower extremity, mildly withdraws right upper and lower extremities with noxious stimulus.  Basic Metabolic Panel: Recent Labs  Lab 12/20/20 0316 12/21/20 3500 12/22/20 0404 12/23/20 0302 12/24/20 0255 12/24/20 0909 12/24/20 1650 12/25/20 0605  NA 139 140 143 144 147* 151*  --  150*  K 5.1 4.5 5.3* 4.9 4.3 4.1  --  4.3  CL 107 106 103 112* 115*  --   --  118*  CO2 22 27 23  21* 20*  --   --  17*  GLUCOSE 90 93 116* 129* 174*  --   --  218*  BUN 46* 42* 47* 44* 44*  --   --  54*  CREATININE 1.97* 2.07* 2.02* 2.12* 2.14*  --   --  2.30*  CALCIUM 9.2 9.1 9.3 8.8* 9.1  --   --  8.5*  MG 2.5*  --   --   --   --   --  2.2 2.4  PHOS  --   --   --   --   --   --  3.5 2.6    CBC: Recent Labs  Lab 12/19/20 0308 12/22/20 0404 12/23/20 0302 12/24/20 0255 12/24/20 0909 12/25/20 0605  WBC 6.7 4.5 5.6 6.4  --  8.4  HGB 15.5 17.6* 16.6 16.8 16.3 16.6  HCT 45.4 54.0* 50.6 52.4* 48.0 51.0  MCV 94.2 96.1 96.4 96.1  --  97.0  PLT 167 PLATELET CLUMPS NOTED ON SMEAR, UNABLE TO ESTIMATE 127* 159  --  150     Ref. Range 12/24/2020 16:00 12/24/2020 18:19  Appearance, CSF Latest Ref Range: CLEAR  CLEAR (A)  CLEAR (A)  Glucose, CSF Latest Ref Range: 40 - 70 mg/dL  81 (H)  RBC Count, CSF Latest Ref Range: 0 /cu mm 2 (H) 6 (H)  WBC, CSF Latest Ref Range: 0 - 5 /cu mm 3 3  Other Cells, CSF Unknown TOO FEW TO COUNT, SMEAR AVAILABLE FOR REVIEW TOO FEW TO COUNT, SMEAR AVAILABLE FOR REVIEW  Color, CSF Latest Ref Range: COLORLESS  COLORLESS COLORLESS  Supernatant Unknown NOT INDICATED NOT INDICATED  Total  Protein, CSF Latest Ref Range: 15 - 45 mg/dL  27  Tube # Unknown 4 1    Ref. Range 12/24/2020 18:19  Crypto Ag NEGATIVE  NEGATIVE   CSF gram stain - NO ORGANISMS SEEN   cEEG 12/23/2020 - 12/24/2020  Seizure without clinical signs, left frontocentral region lasting 15 seconds.  Imaging CT had without contrast reviewed which showed acute/subacute right cerebellar stroke.  CTA head and neck reviewed which showed no large vessel occlusion.   ASSESSMENT AND PLAN: 80 year old male prior Hx melanoma with mets to left frontal lobe c/b hemorrhage 2017 s/p resection,  bone mets, CAD, CKD, depression, anxiety, HTN and HLD with progressive aphasia and confusion.  EEG showed refractory seizures arising from left central temporal region. ASMs were escalated to control seizures and encephalopathy persisted despite mildly improved metabolic derangements. Oncologist provided there was little to no suspicion of leptomeningeal carcinomatosis and LP was unrevealing. Encephalopathy may be multifactorial secondary to the sedating side effects of antiseizure medications in setting of hypernatremia and decreased GFR.     Focal seizures with alteration of awareness (improved) Metastatic melanoma with brain mets Acute toxic-metabolic encephalopathy  Hyperammonemia due to Depakote toxicity (resolved) Right hemiparesis (POA) Right cerebellar stroke - Acute   Recommendations -Discontinued Onfi to minimize sedation (long half life ~40 hours) it will take a couple of days to clear. Keppra reduced to 500 mg twice daily (renally  adjusted) -Continue Depakote 500 mg AM 250 mg afternoon 500mg  PM. - Continue Vimpat 150 mg twice daily. -Discontinue trazodone to minimize sedation -Cannot obtain MRI brain due to pacemaker that is incompatible  -Continue video EEG to assess for seizure frequency as ASMs are reduced. - Last electrographic seizure lasted 15 seconds recorded 12/23/2020 - 12/24/2020. -If patient has sz recurrence, will increase Vimpat to 200 mg twice daily -Continue seizure precautions -Continue delirium precautions, aspiration precautions - as needed IV Versed 2 mg for clinical seizure-like activity lasting more than 15 minutes -Management of rest of comorbidities per primary team    CRITICAL CARE Performed by: Gwinda Maine  Total critical care time: 75 minutes  Critical care time was exclusive of separately billable procedures and treating other patients.  Critical care was necessary to treat or prevent imminent or life-threatening deterioration.  Critical care was time spent personally by me on the following activities: development of treatment plan with patient and/or surrogate as well as nursing, discussions with consultants, evaluation of patient's response to treatment, examination of patient, obtaining history from patient or surrogate, ordering and performing treatments and interventions, ordering and review of laboratory studies, ordering and review of radiographic studies, pulse oximetry and re-evaluation of patient's condition.   Lynnae Sandhoff, MD Stroke Neurology Page: [phone]

## 2020-12-25 NOTE — Progress Notes (Signed)
PROGRESS NOTE        PATIENT DETAILS Name: David [name]. Age: 80 y.o. Sex: male Date of Birth: November 19, 1940 Admit Date: 12/26/2020 Admitting Physician No admitting provider for patient encounter. ATF:[email or web address], [name], MD  Brief Narrative: Patient is a 80 y.o. male with history of metastatic melanoma with bone/lung/brain mets-s/p brain radiation/left frontal craniotomy and tumor excision (July 2017), acute promyelocytic leukemia, hemorrhagic CVA, HTN, CAD s/p CABG 2014, AV block-s/p PPM placement, OSA on CPAP, depression who presented to [facility] with aphasia-found to have EEG evidence of seizures originating from the left temporal region-subsequently transferred to [facility] for LTM EEG.  Patient was felt to have intractable seizures-and was started on multiple antiepileptics (Onfi, Depakote, Keppra, Vimpat) subsequent hospital course complicated by encephalopathy (felt to be due to Onfi/seizure meds), fever and acute CVA. See below for further details.  Subjective: Still very drowsy-but did open his eye to painful stimuli earlier this morning.  Objective: Vitals: Blood pressure (!) 162/115, pulse (!) 109, temperature 99.4 F (37.4 C), temperature source Axillary, resp. rate 18, SpO2 95 %.   Exam: Gen Exam: Sleepy/drowsy-maintaining airway.  NG tube in place. HEENT:atraumatic, normocephalic Chest: B/L clear to auscultation anteriorly CVS:S1S2 regular Abdomen:soft non tender, non distended Extremities:no edema Neurology: Difficult exam-unable to assess.    Pertinent labs/radiology: NA: 150 Creatinine: 2.30 WBC: 8.4 Plt:150 CSF: WBC 3, protein 27  Stroke work-up: 9/16>> CT head: Right cerebellar CVA 9/16>> CTA head and neck: No LVO 9/16>> Echo: EF 55-60% 9/16>> LDL: 42 9/16>> A1c: 5.8  Microbiology: 9/14>>Blood culture: 1/2-staff epidermidis 9/16>> blood culture: No growth 9/16>> CSF culture: No growth  Pathology: 9/16>>CSF  cytology:pending  LTM EEG overnight: No seizures   Assessment/Plan: New refractory focal seizures: No further seizures on LTM EEG-on Vimpat/Keppra/Depakote.  No longer on Onfi given severe sedation.  Neurology following  Acute toxic encephalopathy: Thought to be due to to seizure medications-particularly Onfi which has a long half-life.  Per neurology-right cerebellar CVA cannot account for current level of encephalopathy.  No CTA head evidence of basilar artery thrombosis.  LP performed on 9/16 to rule out leptomeningeal involvement (low probability) from melanoma-cytology currently pending.  Still very sedated-briefly opened his eyes to me this morning when aroused with painful stimuli.  Not accumulating secretions-airway remains patent.  ABG yesterday with normal PCO2.  Plans are to watch closely-allow benzodiazepines to washout from the system.   Acute right cerebellar CVA: Suspected embolic etiology-PPM interrogated today-no A. fib.  CTA head/neck with no evidence of basilar artery thrombosis.  Remains on aspirin.  Await further input from neurology.  Fever on 9/13-?  Aspiration PNA: No further fever since 9/13-high suspicion for aspiration pneumonia given sedation/lethargy and fever.  No other sources of infection apparent.  Plans are to complete 5 days of empiric antimicrobial therapy.  Coag negative staph bacteremia: 1/2 blood culture on 9/14 positive for staph epidermidis-repeat cultures on 9/16 (before vancomycin started) negative so far.  Although high suspicion for contamination-patient has PPM in place.  Since repeat cultures are negative-suspect can discontinue vancomycin.   AKI on CKD stage IIIb: AKI likely due to contrast nephropathy-received IV contrast for CT angio head/neck on 9/16 (risk of contrast-induced nephropathy was discussed with daughter).  Hopeful that renal function will plateau over the next few days.  Watch closely.   Hypernatremia: Secondary to dehydration (no oral  intake due  to encephalopathy) -has been on IV fluids-now that NG tube in place-on free water flushes.  Repeat electrolytes tomorrow.  Decrease 0.9 NS to 50 cc an hour.    Hyperglycemia: Likely due to tube feedings-no evidence of diabetes.  Continue SSI-increase Levemir to 10 units twice daily.  Recent Labs    12/25/20 0526 12/25/20 0922 12/25/20 1153  GLUCAP 213* 273* 270*     HTN: BP on the higher side-has CVA-hence will allow some amount of permissive hypertension-continue metoprolol cautiously.  Amlodipine remains on hold.   History of CAD s/p CABG in 2014: No anginal symptoms-continue supportive care.  On aspirin/beta-blocker.  History of complete heart block-PPM in place  History of hemiplegia and hemiparesis following nontraumatic intracranial hemorrhage affecting right dominant side  History of metastatic melanoma with metastasis to the lingula, right upper lung, supraventricular clavicular lymph node, left frontal lobe metastasis-s/p resection: Resume outpatient follow-up with oncology/neurooncology.    History of acute promyelocytic leukemia: Follow CBC periodically--in remission-stable for outpatient follow-up with primary oncologist.  Major depressive disorder: Trazodone held due to encephalopathy-on Zoloft.  Hypothyroidism: Continue levothyroxine.  Recent TSH in May was within normal limits.   OSA:on CPAP-on hold as NG tube is in place.  Nutrition: Cortak tube placed on 9/16-feedings ongoing.  Will need SLP reevaluation once encephalopathy has improved before discontinuing cortak.  Functional quadriplegia/severe debility/deconditioning: Normally able to ambulate with the help of walker-current significant debility is from acute illness.  Once encephalopathy resolves-hopeful that we can start PT/OT.  Obesity: Estimated body mass index is 30.41 kg/m as calculated from the following:   Height as of 12/15/20: 5\' 8"  (1.727 m).   Weight as of 12/15/20: 90.7 kg.     Procedures : 9/16>> lumbar puncture by IR  Consults:Neurology DVT Prophylaxis :Prophylactic Lovenox Code Status:DNR Family Communication:Daughter-[name] [phone] on 9/17- mailbox full-we will try later.   Disposition Plan: Status is: Inpatient  Remains inpatient appropriate because:Inpatient level of care appropriate due to severity of illness  Dispo: The patient is from: Home              Anticipated d/c is to: SNF              Patient currently is not medically stable to d/c.   Difficult to place patient No    Barriers to Discharge: Ongoing encephalopathy-intractable seizures-LTM EEG in progress-not stable for discharge.  Antimicrobial agents: Anti-infectives (From admission, onward)    Start     Dose/Rate Route Frequency Ordered Stop   12/26/20 1600  vancomycin (VANCOREADY) IVPB 1250 mg/250 mL  Status:  Discontinued        1,250 mg 166.7 mL/hr over 90 Minutes Intravenous Every 48 hours 12/24/20 1252 12/25/20 1038   12/26/20 1600  vancomycin (VANCOCIN) IVPB 1000 mg/200 mL premix        1,000 mg 200 mL/hr over 60 Minutes Intravenous Every 48 hours 12/25/20 1038     12/24/20 1800  Ampicillin-Sulbactam (UNASYN) 3 g in sodium chloride 0.9 % 100 mL IVPB        3 g 200 mL/hr over 30 Minutes Intravenous Every 12 hours 12/24/20 1309 12/26/20 0559   12/24/20 1345  vancomycin (VANCOREADY) IVPB 1750 mg/350 mL        1,750 mg 175 mL/hr over 120 Minutes Intravenous  Once 12/24/20 1251 12/24/20 1548   12/22/20 1300  ampicillin-sulbactam (UNASYN) 1.5 g in sodium chloride 0.9 % 100 mL IVPB  Status:  Discontinued        1.5  g 200 mL/hr over 30 Minutes Intravenous Every 8 hours 12/22/20 1202 12/24/20 1309   12/21/20 1900  azithromycin (ZITHROMAX) 500 mg in sodium chloride 0.9 % 250 mL IVPB  Status:  Discontinued        500 mg 250 mL/hr over 60 Minutes Intravenous Every 24 hours 12/21/20 1805 12/22/20 1058   12/21/20 1730  cefTRIAXone (ROCEPHIN) 1 g in sodium  chloride 0.9 % 100 mL IVPB  Status:  Discontinued        1 g 200 mL/hr over 30 Minutes Intravenous Every 24 hours 12/21/20 1633 12/22/20 1058   12/21/20 1730  azithromycin (ZITHROMAX) tablet 500 mg  Status:  Discontinued        500 mg Oral Daily 12/21/20 1633 12/21/20 1804        Time spent: 35 minutes-Greater than 50% of this time was spent in counseling, explanation of diagnosis, planning of further management, and coordination of care.  MEDICATIONS: Scheduled Meds:  allopurinol  200 mg Per Tube Daily   aspirin  81 mg Per Tube Daily   enoxaparin (LOVENOX) injection  30 mg Subcutaneous Q24H   feeding supplement (PROSource TF)  45 mL Per Tube BID   free water  300 mL Per Tube Q4H   insulin aspart  0-15 Units Subcutaneous Q4H   insulin aspart  0-5 Units Subcutaneous QHS   insulin detemir  7 Units Subcutaneous q AM   levothyroxine  137 mcg Per Tube Q0600   lidocaine (PF)  5 mL Other Once   metoprolol tartrate  37.5 mg Per Tube BID   sertraline  50 mg Per Tube Daily   valproic acid  500 mg Per Tube BID   And   [START ON 12/26/2020] valproic acid  250 mg Per Tube Q24H   Continuous Infusions:  sodium chloride 50 mL/hr at 12/25/20 0855   ampicillin-sulbactam (UNASYN) IV 200 mL/hr at 12/25/20 0535   feeding supplement (OSMOLITE 1.5 CAL) 60 mL/hr at 12/25/20 0535   lacosamide (VIMPAT) IV 150 mg (12/25/20 1155)   levETIRAcetam     valproate sodium Stopped (12/24/20 1957)   [START ON 12/26/2020] vancomycin     PRN Meds:.acetaminophen **OR** acetaminophen, albuterol, colchicine, meclizine, midazolam, ondansetron **OR** ondansetron (ZOFRAN) IV   LABORATORY DATA: CBC: Recent Labs  Lab 12/19/20 0308 12/22/20 0404 12/23/20 0302 12/24/20 0255 12/24/20 0909 12/25/20 0605  WBC 6.7 4.5 5.6 6.4  --  8.4  HGB 15.5 17.6* 16.6 16.8 16.3 16.6  HCT 45.4 54.0* 50.6 52.4* 48.0 51.0  MCV 94.2 96.1 96.4 96.1  --  97.0  PLT 167 PLATELET CLUMPS NOTED ON SMEAR, UNABLE TO ESTIMATE 127* 159  --   150     Basic Metabolic Panel: Recent Labs  Lab 12/20/20 0316 12/21/20 0643 12/22/20 0404 12/23/20 0302 12/24/20 0255 12/24/20 0909 12/24/20 1650 12/25/20 0605  NA 139 140 143 144 147* 151*  --  150*  K 5.1 4.5 5.3* 4.9 4.3 4.1  --  4.3  CL 107 106 103 112* 115*  --   --  118*  CO2 22 27 23  21* 20*  --   --  17*  GLUCOSE 90 93 116* 129* 174*  --   --  218*  BUN 46* 42* 47* 44* 44*  --   --  54*  CREATININE 1.97* 2.07* 2.02* 2.12* 2.14*  --   --  2.30*  CALCIUM 9.2 9.1 9.3 8.8* 9.1  --   --  8.5*  MG 2.5*  --   --   --   --   --  2.2 2.4  PHOS  --   --   --   --   --   --  3.5 2.6     GFR: Estimated Creatinine Clearance: 27.5 mL/min (A) (by C-G formula based on SCr of 2.3 mg/dL (H)).  Liver Function Tests: Recent Labs  Lab 12/23/20 0302 12/24/20 0255  AST 41 27  ALT 35 26  ALKPHOS 44 45  BILITOT 0.8 0.6  PROT 6.0* 6.2*  ALBUMIN 3.0* 2.9*    No results for input(s): LIPASE, AMYLASE in the last 168 hours. Recent Labs  Lab 12/21/20 0914 12/23/20 0917 12/25/20 0605  AMMONIA 42* 32 52*     Coagulation Profile: No results for input(s): INR, PROTIME in the last 168 hours.   Cardiac Enzymes: No results for input(s): CKTOTAL, CKMB, CKMBINDEX, TROPONINI in the last 168 hours.  BNP (last 3 results) No results for input(s): PROBNP in the last 8760 hours.  Lipid Profile: Recent Labs    12/24/20 1903  CHOL 108  HDL 34*  LDLCALC 42  TRIG 161*  CHOLHDL 3.2    Thyroid Function Tests: No results for input(s): TSH, T4TOTAL, FREET4, T3FREE, THYROIDAB in the last 72 hours.  Anemia Panel: No results for input(s): VITAMINB12, FOLATE, FERRITIN, TIBC, IRON, RETICCTPCT in the last 72 hours.  Urine analysis:    Component Value Date/Time   COLORURINE YELLOW 12/22/2020 0340   APPEARANCEUR CLEAR 12/22/2020 0340   LABSPEC 1.020 12/22/2020 0340   PHURINE 6.0 12/22/2020 0340   GLUCOSEU NEGATIVE 12/22/2020 0340   GLUCOSEU NEGATIVE 10/12/2020 1118   HGBUR LARGE  (A) 12/22/2020 0340   BILIRUBINUR NEGATIVE 12/22/2020 0340   KETONESUR NEGATIVE 12/22/2020 0340   PROTEINUR 100 (A) 12/22/2020 0340   UROBILINOGEN 0.2 10/12/2020 1118   NITRITE NEGATIVE 12/22/2020 0340   LEUKOCYTESUR NEGATIVE 12/22/2020 0340    Sepsis Labs: Lactic Acid, Venous    Component Value Date/Time   LATICACIDVEN 0.8 01/03/2019 0046    MICROBIOLOGY: Recent Results (from the past 240 hour(s))  Resp Panel by RT-PCR (Flu A&B, Covid) Nasopharyngeal Swab     Status: None   Collection Time: 12/21/20  4:34 PM   Specimen: Nasopharyngeal Swab; Nasopharyngeal(NP) swabs in vial transport medium  Result Value Ref Range Status   SARS Coronavirus 2 by RT PCR NEGATIVE NEGATIVE Final    Comment: (NOTE) SARS-CoV-2 target nucleic acids are NOT DETECTED.  The SARS-CoV-2 RNA is generally detectable in upper respiratory specimens during the acute phase of infection. The lowest concentration of SARS-CoV-2 viral copies this assay can detect is 138 copies/mL. A negative result does not preclude SARS-Cov-2 infection and should not be used as the sole basis for treatment or other patient management decisions. A negative result may occur with  improper specimen collection/handling, submission of specimen other than nasopharyngeal swab, presence of viral mutation(s) within the areas targeted by this assay, and inadequate number of viral copies(<138 copies/mL). A negative result must be combined with clinical observations, patient history, and epidemiological information. The expected result is Negative.  Fact Sheet for Patients:  EntrepreneurPulse.com.au  Fact Sheet for Healthcare Providers:  IncredibleEmployment.be  This test is no t yet approved or cleared by the [place] FDA and  has been authorized for detection and/or diagnosis of SARS-CoV-2 by FDA under an Emergency Use Authorization (EUA). This EUA will remain  in effect (meaning this test can  be used) for the duration of the COVID-19 declaration under Section 564(b)(1) of the Act, 21 U.S.C.section 360bbb-3(b)(1), unless the authorization is terminated  or revoked sooner.       Influenza A by PCR NEGATIVE NEGATIVE Final   Influenza B by PCR NEGATIVE NEGATIVE Final    Comment: (NOTE) The Xpert Xpress SARS-CoV-2/FLU/RSV plus assay is intended as an aid in the diagnosis of influenza from Nasopharyngeal swab specimens and should not be used as a sole basis for treatment. Nasal washings and aspirates are unacceptable for Xpert Xpress SARS-CoV-2/FLU/RSV testing.  Fact Sheet for Patients: EntrepreneurPulse.com.au  Fact Sheet for Healthcare Providers: IncredibleEmployment.be  This test is not yet approved or cleared by the [place] FDA and has been authorized for detection and/or diagnosis of SARS-CoV-2 by FDA under an Emergency Use Authorization (EUA). This EUA will remain in effect (meaning this test can be used) for the duration of the COVID-19 declaration under Section 564(b)(1) of the Act, 21 U.S.C. section 360bbb-3(b)(1), unless the authorization is terminated or revoked.  Performed at [facility], [place] [address], [place], [place] [address]   MRSA Next Gen by PCR, Nasal     Status: Abnormal   Collection Time: 12/21/20  4:36 PM   Specimen: Nasopharyngeal Swab; Nasal Swab  Result Value Ref Range Status   MRSA by PCR Next Gen NEGATIVE (A) NOT DETECTED Final    Comment:        The GeneXpert MRSA Assay (FDA approved for NASAL specimens only), is one component of a comprehensive MRSA colonization surveillance program. It is not intended to diagnose MRSA infection nor to guide or monitor treatment for MRSA infections. Performed at [facility], [place] [address], [place], [place] [address]   Culture, blood (routine x 2)     Status: Abnormal   Collection Time: 12/21/20  5:13 PM   Specimen: BLOOD RIGHT HAND  Result  Value Ref Range Status   Specimen Description BLOOD RIGHT HAND  Final   Special Requests   Final    BOTTLES DRAWN AEROBIC ONLY Blood Culture results may not be optimal due to an inadequate volume of blood received in culture bottles   Culture  Setup Time   Final    GRAM POSITIVE COCCI IN CLUSTERS AEROBIC BOTTLE ONLY CRITICAL RESULT CALLED TO, READ BACK BY AND VERIFIED WITH: PHARMD ALEX L [id number] FCP    Culture (A)  Final    STAPHYLOCOCCUS EPIDERMIDIS THE SIGNIFICANCE OF ISOLATING THIS ORGANISM FROM A SINGLE SET OF BLOOD CULTURES WHEN MULTIPLE SETS ARE DRAWN IS UNCERTAIN. PLEASE NOTIFY THE MICROBIOLOGY DEPARTMENT WITHIN ONE WEEK IF SPECIATION AND SENSITIVITIES ARE REQUIRED. Performed at [facility], [place] [address], [place], [place] [address]    Report Status 12/24/2020 FINAL  Final  Blood Culture ID Panel (Reflexed)     Status: Abnormal   Collection Time: 12/21/20  5:13 PM  Result Value Ref Range Status   Enterococcus faecalis NOT DETECTED NOT DETECTED Final   Enterococcus Faecium NOT DETECTED NOT DETECTED Final   Listeria monocytogenes NOT DETECTED NOT DETECTED Final   Staphylococcus species DETECTED (A) NOT DETECTED Final    Comment: CRITICAL RESULT CALLED TO, READ BACK BY AND VERIFIED WITH: PHARMD ALEX L [id number] FCP    Staphylococcus aureus (BCID) NOT DETECTED NOT DETECTED Final   Staphylococcus epidermidis DETECTED (A) NOT DETECTED Final    Comment: Methicillin (oxacillin) resistant coagulase negative staphylococcus. Possible blood culture contaminant (unless isolated from more than one blood culture draw or clinical case suggests pathogenicity). No antibiotic treatment is indicated for blood  culture contaminants. CRITICAL RESULT CALLED TO, READ BACK  BY AND VERIFIED WITH: PHARMD ALEX L [id number] FCP    Staphylococcus lugdunensis NOT DETECTED NOT DETECTED Final   Streptococcus species NOT DETECTED NOT DETECTED Final   Streptococcus agalactiae NOT DETECTED NOT  DETECTED Final   Streptococcus pneumoniae NOT DETECTED NOT DETECTED Final   Streptococcus pyogenes NOT DETECTED NOT DETECTED Final   A.calcoaceticus-baumannii NOT DETECTED NOT DETECTED Final   Bacteroides fragilis NOT DETECTED NOT DETECTED Final   Enterobacterales NOT DETECTED NOT DETECTED Final   Enterobacter cloacae complex NOT DETECTED NOT DETECTED Final   Escherichia coli NOT DETECTED NOT DETECTED Final   Klebsiella aerogenes NOT DETECTED NOT DETECTED Final   Klebsiella oxytoca NOT DETECTED NOT DETECTED Final   Klebsiella pneumoniae NOT DETECTED NOT DETECTED Final   Proteus species NOT DETECTED NOT DETECTED Final   Salmonella species NOT DETECTED NOT DETECTED Final   Serratia marcescens NOT DETECTED NOT DETECTED Final   Haemophilus influenzae NOT DETECTED NOT DETECTED Final   Neisseria meningitidis NOT DETECTED NOT DETECTED Final   Pseudomonas aeruginosa NOT DETECTED NOT DETECTED Final   Stenotrophomonas maltophilia NOT DETECTED NOT DETECTED Final   Candida albicans NOT DETECTED NOT DETECTED Final   Candida auris NOT DETECTED NOT DETECTED Final   Candida glabrata NOT DETECTED NOT DETECTED Final   Candida krusei NOT DETECTED NOT DETECTED Final   Candida parapsilosis NOT DETECTED NOT DETECTED Final   Candida tropicalis NOT DETECTED NOT DETECTED Final   Cryptococcus neoformans/gattii NOT DETECTED NOT DETECTED Final   Methicillin resistance mecA/C DETECTED (A) NOT DETECTED Final    Comment: CRITICAL RESULT CALLED TO, READ BACK BY AND VERIFIED WITH: PHARMD ALEX L [id number] FCP Performed at [facility], [address] [place], [place] [address]   Culture, blood (routine x 2)     Status: None (Preliminary result)   Collection Time: 12/21/20  5:45 PM   Specimen: BLOOD RIGHT ARM  Result Value Ref Range Status   Specimen Description BLOOD RIGHT ARM  Final   Special Requests   Final    BOTTLES DRAWN AEROBIC ONLY Blood Culture adequate volume   Culture   Final    NO GROWTH 4  DAYS Performed at [facility], [place] [address], [place], [place] [address]    Report Status PENDING  Incomplete  Urine Culture     Status: Abnormal   Collection Time: 12/22/20  3:40 AM   Specimen: Urine, Clean Catch  Result Value Ref Range Status   Specimen Description URINE, CLEAN CATCH  Final   Special Requests NONE  Final   Culture (A)  Final    <10,000 COLONIES/mL INSIGNIFICANT GROWTH Performed at [facility], [place] [address] [place], [place] [address]    Report Status 12/23/2020 FINAL  Final  Culture, blood (routine x 2)     Status: None (Preliminary result)   Collection Time: 12/24/20 11:39 AM   Specimen: BLOOD LEFT HAND  Result Value Ref Range Status   Specimen Description BLOOD LEFT HAND  Final   Special Requests BOTTLES DRAWN AEROBIC ONLY  Final   Culture   Final    NO GROWTH < 24 HOURS Performed at [facility], [place] [address], [place], [place] [address]    Report Status PENDING  Incomplete  Culture, blood (routine x 2)     Status: None (Preliminary result)   Collection Time: 12/24/20 11:49 AM   Specimen: BLOOD RIGHT HAND  Result Value Ref Range Status   Specimen Description BLOOD RIGHT  HAND  Final   Special Requests BOTTLES DRAWN AEROBIC ONLY  Final   Culture   Final    NO GROWTH < 24 HOURS Performed at [facility], [place] [address], [place], [place] [address]    Report Status PENDING  Incomplete  CSF culture w Gram Stain     Status: None (Preliminary result)   Collection Time: 12/24/20  6:19 PM   Specimen: CSF; Cerebrospinal Fluid  Result Value Ref Range Status   Specimen Description CSF  Final   Special Requests Normal  Final   Gram Stain NO ORGANISMS SEEN NO WBC SEEN CYTOSPIN SMEAR   Final   Culture   Final    NO GROWTH < 24 HOURS Performed at [facility], [place] [address] [place], [place] [address]    Report Status PENDING  Incomplete    RADIOLOGY STUDIES/RESULTS: CT ANGIO HEAD W OR WO CONTRAST  Result Date:  12/24/2020 CLINICAL DATA:  Neuro deficit, acute, stroke suspected; Stroke/TIA, assess intracranial arteries EXAM: CT ANGIOGRAPHY HEAD AND NECK TECHNIQUE: Multidetector CT imaging of the head and neck was performed using the standard protocol during bolus administration of intravenous contrast. Multiplanar CT image reconstructions and MIPs were obtained to evaluate the vascular anatomy. Carotid stenosis measurements (when applicable) are obtained utilizing NASCET criteria, using the distal internal carotid diameter as the denominator. CONTRAST:  67mL OMNIPAQUE IOHEXOL 350 MG/ML SOLN COMPARISON:  CTA Aug 23, 2015.  Same day CT head. FINDINGS: CTA NECK FINDINGS Aortic arch: Atherosclerosis of the carotid bifurcation and great vessel origins. Great vessel origins are patent. Right carotid system: Mild mixed calcific and noncalcific atherosclerosis without greater than 50% stenosis. Tortuous ICA. Left carotid system: Moderate mixed calcific and noncalcific atherosclerosis at the carotid bifurcation without greater than 50% stenosis. Vertebral arteries: Left dominant. The right vertebral artery is small throughout its course. Mild multifocal narrowing of the left vertebral artery due to mass effect from adjacent osteophytes. No greater than 50% stenosis. Skeleton: Severe degenerative disease in the lower cervical spine. Other neck: No acute abnormality. Upper chest: Visualized lung apices are clear. Review of the MIP images confirms the above findings CTA HEAD FINDINGS Anterior circulation: Bilateral intracranial ICAs, MCAs, and ACAs are patent without proximal hemodynamically significant stenosis. No aneurysm identified. Posterior circulation: Moderate stenosis of the non dominant right vertebral artery, similar to 2017.Bilateral PICAs are patent. Left vertebral artery is dominant and patent without significant stenosis. The basilar artery and both posterior cerebral arteries are patent without proximal hemodynamically  significant stenosis. No aneurysm identified. Venous sinuses: As permitted by contrast timing, patent. Anatomic variants: None. Review of the MIP images confirms the above findings IMPRESSION: CTA head: 1. No large vessel occlusion. 2. Moderate stenosis of the right intradural vertebral artery, similar to 2017. CTA neck : No significant (greater than 50%) stenosis. Electronically Signed   By: Margaretha Sheffield M.D.   On: 12/24/2020 15:20   DG Abd 1 View  Result Date: 12/24/2020 CLINICAL DATA:  Feeding tube placement EXAM: ABDOMEN - 1 VIEW COMPARISON:  CT abdomen 01/05/2019 FINDINGS: Pacer lead noted.  Lower sternotomy wire loaded. The feeding tube projects in the vicinity of the pylorus and could be in the distal stomach antrum or duodenal bulb. No dilated bowel identified.  Lumbar spondylosis noted. IMPRESSION: 1. Feeding tube tip is periportal or can could be in the distal stomach antrum or duodenal bulb. 2. Pacer device noted. Electronically Signed   By: Van Clines M.D.   On: 12/24/2020  18:34   CT HEAD WO CONTRAST (5MM)  Result Date: 12/24/2020 CLINICAL DATA:  Seizure, abnormal neuro exam EXAM: CT HEAD WITHOUT CONTRAST TECHNIQUE: Contiguous axial images were obtained from the base of the skull through the vertex without intravenous contrast. COMPARISON:  12/21/2020 FINDINGS: Brain: No acute intracranial hemorrhage. There is a new area of hypoattenuation involving the right brachium pontis and adjacent cerebellum (series 3, image 10). Left frontal encephalomalacia underlying craniotomy with ex vacuo dilatation of the left lateral ventricle. Ventricles are stable in size. Stable findings of chronic microvascular ischemic changes in the cerebral white matter. Vascular: No hyperdense vessel. Skull: Left craniotomy. Sinuses/Orbits: No acute finding. Other: None. IMPRESSION: New low-density involving right brachium pontis and adjacent cerebellum suspicious for acute or subacute infarction. No acute  intracranial hemorrhage. Otherwise stable findings detailed above. These results will be called to the ordering clinician or representative by the Radiologist Assistant, and communication documented in the PACS or [employer]. Electronically Signed   By: Macy Mis M.D.   On: 12/24/2020 13:14   CT ANGIO NECK W OR WO CONTRAST  Result Date: 12/24/2020 CLINICAL DATA:  Neuro deficit, acute, stroke suspected; Stroke/TIA, assess intracranial arteries EXAM: CT ANGIOGRAPHY HEAD AND NECK TECHNIQUE: Multidetector CT imaging of the head and neck was performed using the standard protocol during bolus administration of intravenous contrast. Multiplanar CT image reconstructions and MIPs were obtained to evaluate the vascular anatomy. Carotid stenosis measurements (when applicable) are obtained utilizing NASCET criteria, using the distal internal carotid diameter as the denominator. CONTRAST:  35mL OMNIPAQUE IOHEXOL 350 MG/ML SOLN COMPARISON:  CTA Aug 23, 2015.  Same day CT head. FINDINGS: CTA NECK FINDINGS Aortic arch: Atherosclerosis of the carotid bifurcation and great vessel origins. Great vessel origins are patent. Right carotid system: Mild mixed calcific and noncalcific atherosclerosis without greater than 50% stenosis. Tortuous ICA. Left carotid system: Moderate mixed calcific and noncalcific atherosclerosis at the carotid bifurcation without greater than 50% stenosis. Vertebral arteries: Left dominant. The right vertebral artery is small throughout its course. Mild multifocal narrowing of the left vertebral artery due to mass effect from adjacent osteophytes. No greater than 50% stenosis. Skeleton: Severe degenerative disease in the lower cervical spine. Other neck: No acute abnormality. Upper chest: Visualized lung apices are clear. Review of the MIP images confirms the above findings CTA HEAD FINDINGS Anterior circulation: Bilateral intracranial ICAs, MCAs, and ACAs are patent without proximal  hemodynamically significant stenosis. No aneurysm identified. Posterior circulation: Moderate stenosis of the non dominant right vertebral artery, similar to 2017.Bilateral PICAs are patent. Left vertebral artery is dominant and patent without significant stenosis. The basilar artery and both posterior cerebral arteries are patent without proximal hemodynamically significant stenosis. No aneurysm identified. Venous sinuses: As permitted by contrast timing, patent. Anatomic variants: None. Review of the MIP images confirms the above findings IMPRESSION: CTA head: 1. No large vessel occlusion. 2. Moderate stenosis of the right intradural vertebral artery, similar to 2017. CTA neck : No significant (greater than 50%) stenosis. Electronically Signed   By: Margaretha Sheffield M.D.   On: 12/24/2020 15:20   DG Chest Port 1 View  Result Date: 12/25/2020 CLINICAL DATA:  Evaluate for consolidation.  Shortness of breath. EXAM: PORTABLE CHEST 1 VIEW COMPARISON:  None. FINDINGS: A feeding tube terminates below today's film. A 3 lead pacemaker is identified. There is a tortuous thoracic aorta with atherosclerotic change. The heart, hila, and mediastinum are otherwise unremarkable. No pneumothorax. The lungs are clear. IMPRESSION: No acute abnormalities are identified.  Electronically Signed   By: Dorise Bullion III M.D.   On: 12/25/2020 08:58   ECHOCARDIOGRAM COMPLETE  Result Date: 12/24/2020    ECHOCARDIOGRAM REPORT   Patient Name:   David [name]. Date of Exam: 12/24/2020 Medical Rec #:  [id number]              Height:       68.0 in Accession #:    [id number]             Weight:       200.0 lb Date of Birth:  09/07/1940              BSA:          2.044 m Patient Age:    79 years               BP:           142/100 mmHg Patient Gender: M                      HR:           88 bpm. Exam Location:  Inpatient Procedure: 2D Echo, Cardiac Doppler, Color Doppler and Intracardiac            Opacification Agent                                 MODIFIED REPORT:     This report was modified by Rudean Haskell MD on 12/24/2020 due to                                    Spelling.  Indications:     Stroke  History:         Patient has prior history of Echocardiogram examinations, most                  recent 08/27/2019. Defibrillator; Risk Factors:Hypertension and                  Sleep Apnea. H/O stroke. CKD.  Sonographer:     Clayton Lefort RDCS (AE) Referring Phys:  [id number] Lora Havens Diagnosing Phys: Rudean Haskell MD  Sonographer Comments: Technically challenging study due to limited acoustic windows, Technically difficult study due to poor echo windows, suboptimal parasternal window, suboptimal apical window and suboptimal subcostal window. IMPRESSIONS  1. Left ventricular ejection fraction, by estimation, is 55 to 60%. The left ventricle has normal function. Left ventricular endocardial border not optimally defined to evaluate regional wall motion. There is mild concentric left ventricular hypertrophy. Left ventricular diastolic parameters are indeterminate.  2. Right ventricular systolic function is normal. The right ventricular size is not well visualized. Tricuspid regurgitation signal is inadequate for assessing PA pressure.  3. The mitral valve is grossly normal. No evidence of mitral valve regurgitation.  4. The aortic valve was not well visualized. Aortic valve regurgitation is not visualized.  5. The inferior vena cava is normal in size with greater than 50% respiratory variability, suggesting right atrial pressure of 3 mmHg. Comparison(s): A prior study was performed on 08/27/2019. Difficult comparison-these images are more technically difficult than prior. FINDINGS  Left Ventricle: Left ventricular ejection fraction, by estimation, is 55 to 60%. The left ventricle has normal function. Left ventricular endocardial border not optimally defined to evaluate regional wall motion.  Definity contrast agent was given IV to  delineate the left ventricular endocardial borders. The left ventricular internal cavity size was small. There is mild concentric left ventricular hypertrophy. Left ventricular diastolic parameters are indeterminate. Right Ventricle: The right ventricular size is not well visualized. Right vetricular wall thickness was not well visualized. Right ventricular systolic function is normal. Tricuspid regurgitation signal is inadequate for assessing PA pressure. Left [facility]: Left atrial size was normal in size. Right [facility]: Right atrial size was normal in size. Pericardium: There is no evidence of pericardial effusion. Presence of pericardial fat pad. Mitral Valve: The mitral valve is grossly normal. No evidence of mitral valve regurgitation. Tricuspid Valve: The tricuspid valve is normal in structure. Tricuspid valve regurgitation is not demonstrated. No evidence of tricuspid stenosis. Aortic Valve: The aortic valve was not well visualized. Aortic valve regurgitation is not visualized. Aortic valve mean gradient measures 4.0 mmHg. Aortic valve peak gradient measures 7.1 mmHg. Aortic valve area, by VTI measures 2.21 cm. Pulmonic Valve: The pulmonic valve was not well visualized. Pulmonic valve regurgitation is not visualized. Aorta: The aortic root is normal in size and structure and the ascending aorta was not well visualized. Venous: The inferior vena cava is normal in size with greater than 50% respiratory variability, suggesting right atrial pressure of 3 mmHg. IAS/Shunts: The interatrial septum was not well visualized. Additional Comments: A device lead is visualized.  LEFT VENTRICLE PLAX 2D LVIDd:         3.60 cm  Diastology LVIDs:         2.30 cm  LV e' medial:    8.05 cm/s LV PW:         1.30 cm  LV E/e' medial:  6.2 LV IVS:        1.40 cm  LV e' lateral:   4.68 cm/s LVOT diam:     2.20 cm  LV E/e' lateral: 10.7 LV SV:         40 LV SV Index:   19 LVOT Area:     3.80 cm  IVC IVC diam: 1.20 cm LEFT ATRIUM            Index LA diam:      3.40 cm 1.66 cm/m LA Vol (A2C): 39.3 ml 19.23 ml/m LA Vol (A4C): 35.6 ml 17.42 ml/m  AORTIC VALVE AV Area (Vmax):    1.96 cm AV Area (Vmean):   1.88 cm AV Area (VTI):     2.21 cm AV Vmax:           133.00 cm/s AV Vmean:          91.400 cm/s AV VTI:            0.179 m AV Peak Grad:      7.1 mmHg AV Mean Grad:      4.0 mmHg LVOT Vmax:         68.60 cm/s LVOT Vmean:        45.100 cm/s LVOT VTI:          0.104 m LVOT/AV VTI ratio: 0.58  AORTA Ao Root diam: 3.70 cm Ao Asc diam:  3.40 cm MITRAL VALVE MV Area (PHT): 2.09 cm    SHUNTS MV Decel Time: 363 msec    Systemic VTI:  0.10 m MV E velocity: 50.00 cm/s  Systemic Diam: 2.20 cm MV A velocity: 78.20 cm/s MV E/A ratio:  0.64 Rudean Haskell MD Electronically signed by Rudean Haskell MD Signature Date/Time: 12/24/2020/5:25:24 PM  Final (Updated)    DG FL GUIDED LUMBAR PUNCTURE  Result Date: 12/24/2020 CLINICAL DATA:  Concern for meningitis either infectious or carcinomatosis in a 80 year old male. EXAM: DIAGNOSTIC LUMBAR PUNCTURE UNDER FLUOROSCOPIC GUIDANCE COMPARISON:  None FLUOROSCOPY TIME:  Fluoroscopy Time:  20 seconds Radiation Exposure Index (if provided by the fluoroscopic device): 4.5 mGy Number of Acquired Spot Images: 0 PROCEDURE: Informed consent was obtained from patient's family prior to proceeding. Following time-out in sterile prep and drape and numbing with 1% lidocaine over the superficial soft tissues, the L3-L4 interspace was accessed under fluoroscopic guidance. A 3-1/2 inch 20 gauge spinal needle was utilized yielding clear CSF. Opening pressure was grossly normal but due to Valsalva and prone positioning exact measurement was not obtained. CSF flow was best with the head of bed slightly elevated. 11 cc of clear spinal fluid was collected in 4 separate aliquots. Sample was sent for requested testing. IMPRESSION: Technically successful lumbar puncture at the L3-L4 level without signs of complication. CSF  sent for requested testing. Electronically Signed   By: Zetta Bills M.D.   On: 12/24/2020 18:07     LOS: 7 days   Oren Binet, MD  [facility]    To contact the attending provider between 7A-7P or the covering provider during after hours 7P-7A, please log into the web site [email or web address] and access using universal [place] password for that web site. If you do not have the password, please call the hospital operator.  12/25/2020, 2:16 PM

## 2020-12-25 NOTE — Plan of Care (Signed)
  Problem: Clinical Measurements: Goal: Will remain free from infection Outcome: Progressing Goal: Diagnostic test results will improve Outcome: Progressing Goal: Respiratory complications will improve Outcome: Progressing Goal: Cardiovascular complication will be avoided Outcome: Progressing   

## 2020-12-25 NOTE — Procedures (Addendum)
Patient Name: David [name].  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  EEG to evaluate for seizures.   Level of alertness: lethargic   AEDs during EEG study: LEV, VPA, Vimpat   Technical aspects: This EEG study was done with scalp electrodes positioned according to the 10-20 International system of electrode placement. Electrical activity was acquired at a sampling rate of 500Hz  and reviewed with a high frequency filter of 70Hz  and a low frequency filter of 1Hz . EEG data were recorded continuously and digitally stored.    Description: EEG showed continuous generalized 3-5hz  theta-delta slowing. There was also 3 to 5 Hz sharply contoured theta-delta slowing with overriding 15 to 18 Hz beta activity in left frontal region consistent with prior craniotomy.  Abundant qasi periodic sharp waves were seen in left frontal region.      ABNORMALITY -Sharp wave, left frontal region -Breach artifact, left frontal region   IMPRESSION: This study showed  evidence of epileptogenicity and cortical dysfunction in left frontal region consistent with prior craniotomy. No definite seizures were seen during this study.      Ren Aspinall [name]

## 2020-12-25 NOTE — Progress Notes (Signed)
SLP Cancellation Note  Patient Details Name: David [name]. MRN: [id number] DOB: 05/09/1940   Cancelled treatment:       Reason Eval/Treat Not Completed: Fatigue/lethargy limiting ability to participate;Patient not medically ready  Pt remains inappropriate for PO trials. Per MD "still excessively lethargic, only moaning to noxious stimuli." ST service to continue efforts as appropriate. NGT is in place for nutrition at this time.    [name] P. Jaslen Adcox, M.S., [place] [facility] Pager: [place] 12/25/2020, 10:36 AM

## 2020-12-25 NOTE — Progress Notes (Signed)
FSBS elevated. Stared on Lantus and SSI

## 2020-12-25 NOTE — Progress Notes (Signed)
LTM maintenance.  Tested event button with [facility].   No skin breakdown noted at [place]  FP2  F7  F8

## 2020-12-25 NOTE — Progress Notes (Signed)
STROKE TEAM PROGRESS NOTE   SUBJECTIVE (INTERVAL HISTORY) His son and granddaughter are at the bedside.  Pt was not arousable on voice or pain stimulation, but able to grimace with pain stimulation. Overall his condition is not significantly changed from yesterday. Per Dr. Sloan Leiter, pt mental status had slight improvement from yesterday. LTM EEG no seizure. CT head showed right brachium pontis subacute infarct. Pacer interrogation showed no afib.    OBJECTIVE Temp:  [98.1 F (36.7 C)-99.4 F (37.4 C)] 99.4 F (37.4 C) (09/17 0907) Pulse Rate:  [76-109] 109 (09/17 1149) Cardiac Rhythm: Ventricular paced (09/16 1900) Resp:  [14-18] 18 (09/17 0330) BP: (144-164)/(94-115) 162/115 (09/17 1149) SpO2:  [95 %-97 %] 95 % (09/17 0907)  Recent Labs  Lab 12/24/20 1941 12/25/20 0004 12/25/20 0526 12/25/20 0922 12/25/20 1153  GLUCAP 119* 179* 213* 273* 270*   Recent Labs  Lab 12/20/20 0316 12/21/20 5284 12/22/20 0404 12/23/20 0302 12/24/20 0255 12/24/20 0909 12/24/20 1650 12/25/20 0605  NA 139 140 143 144 147* 151*  --  150*  K 5.1 4.5 5.3* 4.9 4.3 4.1  --  4.3  CL 107 106 103 112* 115*  --   --  118*  CO2 22 27 23  21* 20*  --   --  17*  GLUCOSE 90 93 116* 129* 174*  --   --  218*  BUN 46* 42* 47* 44* 44*  --   --  54*  CREATININE 1.97* 2.07* 2.02* 2.12* 2.14*  --   --  2.30*  CALCIUM 9.2 9.1 9.3 8.8* 9.1  --   --  8.5*  MG 2.5*  --   --   --   --   --  2.2 2.4  PHOS  --   --   --   --   --   --  3.5 2.6   Recent Labs  Lab 12/23/20 0302 12/24/20 0255  AST 41 27  ALT 35 26  ALKPHOS 44 45  BILITOT 0.8 0.6  PROT 6.0* 6.2*  ALBUMIN 3.0* 2.9*   Recent Labs  Lab 12/19/20 0308 12/22/20 0404 12/23/20 0302 12/24/20 0255 12/24/20 0909 12/25/20 0605  WBC 6.7 4.5 5.6 6.4  --  8.4  HGB 15.5 17.6* 16.6 16.8 16.3 16.6  HCT 45.4 54.0* 50.6 52.4* 48.0 51.0  MCV 94.2 96.1 96.4 96.1  --  97.0  PLT 167 PLATELET CLUMPS NOTED ON SMEAR, UNABLE TO ESTIMATE 127* 159  --  150   No  results for input(s): CKTOTAL, CKMB, CKMBINDEX, TROPONINI in the last 168 hours. No results for input(s): LABPROT, INR in the last 72 hours. No results for input(s): COLORURINE, LABSPEC, [place], GLUCOSEU, HGBUR, BILIRUBINUR, KETONESUR, PROTEINUR, UROBILINOGEN, NITRITE, LEUKOCYTESUR in the last 72 hours.  Invalid input(s): APPERANCEUR     Component Value Date/Time   CHOL 108 12/24/2020 1903   TRIG 161 (H) 12/24/2020 1903   HDL 34 (L) 12/24/2020 1903   CHOLHDL 3.2 12/24/2020 1903   VLDL 32 12/24/2020 1903   LDLCALC 42 12/24/2020 1903   Lab Results  Component Value Date   HGBA1C 5.8 (H) 12/24/2020   No results found for: LABOPIA, COCAINSCRNUR, LABBENZ, AMPHETMU, THCU, LABBARB  No results for input(s): ETH in the last 168 hours.  I have personally reviewed the radiological images below and agree with the radiology interpretations.  CT ANGIO HEAD W OR WO CONTRAST  Result Date: 12/24/2020 CLINICAL DATA:  Neuro deficit, acute, stroke suspected; Stroke/TIA, assess intracranial arteries EXAM: CT ANGIOGRAPHY HEAD AND NECK  TECHNIQUE: Multidetector CT imaging of the head and neck was performed using the standard protocol during bolus administration of intravenous contrast. Multiplanar CT image reconstructions and MIPs were obtained to evaluate the vascular anatomy. Carotid stenosis measurements (when applicable) are obtained utilizing NASCET criteria, using the distal internal carotid diameter as the denominator. CONTRAST:  15mL OMNIPAQUE IOHEXOL 350 MG/ML SOLN COMPARISON:  CTA Aug 23, 2015.  Same day CT head. FINDINGS: CTA NECK FINDINGS Aortic arch: Atherosclerosis of the carotid bifurcation and great vessel origins. Great vessel origins are patent. Right carotid system: Mild mixed calcific and noncalcific atherosclerosis without greater than 50% stenosis. Tortuous ICA. Left carotid system: Moderate mixed calcific and noncalcific atherosclerosis at the carotid bifurcation without greater than 50%  stenosis. Vertebral arteries: Left dominant. The right vertebral artery is small throughout its course. Mild multifocal narrowing of the left vertebral artery due to mass effect from adjacent osteophytes. No greater than 50% stenosis. Skeleton: Severe degenerative disease in the lower cervical spine. Other neck: No acute abnormality. Upper chest: Visualized lung apices are clear. Review of the MIP images confirms the above findings CTA HEAD FINDINGS Anterior circulation: Bilateral intracranial ICAs, MCAs, and ACAs are patent without proximal hemodynamically significant stenosis. No aneurysm identified. Posterior circulation: Moderate stenosis of the non dominant right vertebral artery, similar to 2017.Bilateral PICAs are patent. Left vertebral artery is dominant and patent without significant stenosis. The basilar artery and both posterior cerebral arteries are patent without proximal hemodynamically significant stenosis. No aneurysm identified. Venous sinuses: As permitted by contrast timing, patent. Anatomic variants: None. Review of the MIP images confirms the above findings IMPRESSION: CTA head: 1. No large vessel occlusion. 2. Moderate stenosis of the right intradural vertebral artery, similar to 2017. CTA neck : No significant (greater than 50%) stenosis. Electronically Signed   By: Margaretha Sheffield M.D.   On: 12/24/2020 15:20   DG Abd 1 View  Result Date: 12/24/2020 CLINICAL DATA:  Feeding tube placement EXAM: ABDOMEN - 1 VIEW COMPARISON:  CT abdomen 01/05/2019 FINDINGS: Pacer lead noted.  Lower sternotomy wire loaded. The feeding tube projects in the vicinity of the pylorus and could be in the distal stomach antrum or duodenal bulb. No dilated bowel identified.  Lumbar spondylosis noted. IMPRESSION: 1. Feeding tube tip is periportal or can could be in the distal stomach antrum or duodenal bulb. 2. Pacer device noted. Electronically Signed   By: Van Clines M.D.   On: 12/24/2020 18:34   CT HEAD  WO CONTRAST (5MM)  Result Date: 12/24/2020 CLINICAL DATA:  Seizure, abnormal neuro exam EXAM: CT HEAD WITHOUT CONTRAST TECHNIQUE: Contiguous axial images were obtained from the base of the skull through the vertex without intravenous contrast. COMPARISON:  12/21/2020 FINDINGS: Brain: No acute intracranial hemorrhage. There is a new area of hypoattenuation involving the right brachium pontis and adjacent cerebellum (series 3, image 10). Left frontal encephalomalacia underlying craniotomy with ex vacuo dilatation of the left lateral ventricle. Ventricles are stable in size. Stable findings of chronic microvascular ischemic changes in the cerebral white matter. Vascular: No hyperdense vessel. Skull: Left craniotomy. Sinuses/Orbits: No acute finding. Other: None. IMPRESSION: New low-density involving right brachium pontis and adjacent cerebellum suspicious for acute or subacute infarction. No acute intracranial hemorrhage. Otherwise stable findings detailed above. These results will be called to the ordering clinician or representative by the Radiologist Assistant, and communication documented in the PACS or [employer]. Electronically Signed   By: Macy Mis M.D.   On: 12/24/2020 13:14   CT  HEAD WO CONTRAST (5MM)  Result Date: 12/21/2020 CLINICAL DATA:  Mental status change, unknown cause EXAM: CT HEAD WITHOUT CONTRAST TECHNIQUE: Contiguous axial images were obtained from the base of the skull through the vertex without intravenous contrast. COMPARISON:  12/15/2020. FINDINGS: Brain: Similar postsurgical changes of left frontal craniotomy with subjacent encephalomalacia and gliosis. Similar ex vacuo ventricular dilation of the adjacent frontal horn of the left lateral ventricle no evidence of acute large vascular territory infarct, acute hemorrhage, hydrocephalus, mass lesion, extra-axial fluid collection or midline shift. Similar generalized atrophy with ex vacuo ventricular dilation. Additional patchy  white matter hypoattenuation, nonspecific but compatible with chronic microvascular ischemic disease. Vascular: No hyperdense vessel identified. Calcific intracranial atherosclerosis. Skull: No acute fracture.  Left frontal craniotomy. Sinuses/Orbits: Clear sinuses. Postsurgical changes of the left globe. No evidence of acute orbital findin abnormality. G Other: No mastoid effusions. IMPRESSION: No evidence of acute intracranial abnormality. Electronically Signed   By: Margaretha Sheffield M.D.   On: 12/21/2020 16:16   CT HEAD WO CONTRAST  Result Date: 12/15/2020 CLINICAL DATA:  History of CVA, concern for TIAs EXAM: CT HEAD WITHOUT CONTRAST TECHNIQUE: Contiguous axial images were obtained from the base of the skull through the vertex without intravenous contrast. COMPARISON:  11/22/2020 FINDINGS: Brain: Status post left frontal craniotomy with subjacent encephalomalacia. Unchanged appearance of the resection cavity. Unchanged ex vacuo dilatation of the frontal horn of the left lateral ventricle. Evidence of acute infarction, hemorrhage, hydrocephalus, extra-axial collection, mass, mass effect, or midline shift. Vascular: No hyperdense vessel or unexpected calcification. Skull: Prior left frontal craniotomy. No aggressive osseous lesions. Sinuses/Orbits: Left scleral band. Status post bilateral lens replacements. Normal paranasal sinuses. Other: The mastoids are well aerated. IMPRESSION: No acute intracranial process. Electronically Signed   By: Merilyn Baba M.D.   On: 12/15/2020 13:46   CT ANGIO NECK W OR WO CONTRAST  Result Date: 12/24/2020 CLINICAL DATA:  Neuro deficit, acute, stroke suspected; Stroke/TIA, assess intracranial arteries EXAM: CT ANGIOGRAPHY HEAD AND NECK TECHNIQUE: Multidetector CT imaging of the head and neck was performed using the standard protocol during bolus administration of intravenous contrast. Multiplanar CT image reconstructions and MIPs were obtained to evaluate the vascular  anatomy. Carotid stenosis measurements (when applicable) are obtained utilizing NASCET criteria, using the distal internal carotid diameter as the denominator. CONTRAST:  84mL OMNIPAQUE IOHEXOL 350 MG/ML SOLN COMPARISON:  CTA Aug 23, 2015.  Same day CT head. FINDINGS: CTA NECK FINDINGS Aortic arch: Atherosclerosis of the carotid bifurcation and great vessel origins. Great vessel origins are patent. Right carotid system: Mild mixed calcific and noncalcific atherosclerosis without greater than 50% stenosis. Tortuous ICA. Left carotid system: Moderate mixed calcific and noncalcific atherosclerosis at the carotid bifurcation without greater than 50% stenosis. Vertebral arteries: Left dominant. The right vertebral artery is small throughout its course. Mild multifocal narrowing of the left vertebral artery due to mass effect from adjacent osteophytes. No greater than 50% stenosis. Skeleton: Severe degenerative disease in the lower cervical spine. Other neck: No acute abnormality. Upper chest: Visualized lung apices are clear. Review of the MIP images confirms the above findings CTA HEAD FINDINGS Anterior circulation: Bilateral intracranial ICAs, MCAs, and ACAs are patent without proximal hemodynamically significant stenosis. No aneurysm identified. Posterior circulation: Moderate stenosis of the non dominant right vertebral artery, similar to 2017.Bilateral PICAs are patent. Left vertebral artery is dominant and patent without significant stenosis. The basilar artery and both posterior cerebral arteries are patent without proximal hemodynamically significant stenosis. No aneurysm identified. Venous sinuses:  As permitted by contrast timing, patent. Anatomic variants: None. Review of the MIP images confirms the above findings IMPRESSION: CTA head: 1. No large vessel occlusion. 2. Moderate stenosis of the right intradural vertebral artery, similar to 2017. CTA neck : No significant (greater than 50%) stenosis.  Electronically Signed   By: Margaretha Sheffield M.D.   On: 12/24/2020 15:20   NM PET Image Restage (PS) Whole Body (F-18 FDG)  Result Date: 12/11/2020 CLINICAL DATA:  Subsequent treatment strategy for melanoma. EXAM: NUCLEAR MEDICINE PET WHOLE BODY TECHNIQUE: 10.7 mCi F-18 FDG was injected intravenously. Full-ring PET imaging was performed from the head to foot after the radiotracer. CT data was obtained and used for attenuation correction and anatomic localization. Fasting blood glucose: 88 mg/dl COMPARISON:  FDG PET scan 07/27/2019 FINDINGS: Mediastinal blood pool activity: SUV max 2.5 HEAD/NECK: Large field of photopenia in the LEFT frontal lobe corresponds to encephalomalacia on prior CT. Patient status post LEFT frontal craniotomy. No hypermetabolic lymph nodes or skin lesions in the head or neck. Incidental CT findings: none CHEST: No hypermetabolic mediastinal or hilar nodes. No suspicious pulmonary nodules on the CT scan. Incidental CT findings: Small focus of metabolic activity in the LEFT lobe of thyroid gland is unchanged (SUV max equal 5.8). Post CABG ABDOMEN/PELVIS: No abnormal hypermetabolic activity within the liver, pancreas, adrenal glands, or spleen. No hypermetabolic lymph nodes in the abdomen or pelvis. Incidental CT findings: Prostate unremarkable. Atherosclerotic calcification of the aorta. SKELETON: No focal hypermetabolic activity to suggest skeletal metastasis. Incidental CT findings: none EXTREMITIES: No abnormal hypermetabolic activity in the lower extremities. Incidental CT findings: none IMPRESSION: 1. No evidence of metastatic melanoma on whole-body FDG PET scan. 2. LEFT frontal craniotomy. 3. Hypermetabolic activity in small LEFT thyroid nodules unchanged. Consider thyroid ultrasound. Electronically Signed   By: Suzy Bouchard M.D.   On: 12/11/2020 09:56   DG Chest Port 1 View  Result Date: 12/25/2020 CLINICAL DATA:  Evaluate for consolidation.  Shortness of breath. EXAM:  PORTABLE CHEST 1 VIEW COMPARISON:  None. FINDINGS: A feeding tube terminates below today's film. A 3 lead pacemaker is identified. There is a tortuous thoracic aorta with atherosclerotic change. The heart, hila, and mediastinum are otherwise unremarkable. No pneumothorax. The lungs are clear. IMPRESSION: No acute abnormalities are identified. Electronically Signed   By: Dorise Bullion III M.D.   On: 12/25/2020 08:58   DG CHEST PORT 1 VIEW  Result Date: 12/21/2020 CLINICAL DATA:  Sudden onset of fever. EXAM: PORTABLE CHEST 1 VIEW COMPARISON:  Chest x-ray 12/19/2020, CT chest 01/05/2019 FINDINGS: The heart and mediastinal contours are unchanged. Aortic calcification. Three lead cardiac pacemaker in similar position. Cardiac surgical changes again noted. No focal consolidation. No pulmonary edema. No pleural effusion. No pneumothorax. No acute osseous abnormality.  Intact sternotomy wires. IMPRESSION: No active disease. Electronically Signed   By: Iven Finn M.D.   On: 12/21/2020 17:08   DG CHEST PORT 1 VIEW  Result Date: 12/19/2020 CLINICAL DATA:  Cough, confusion, slurred speech EXAM: PORTABLE CHEST 1 VIEW COMPARISON:  Prior chest x-ray 12/15/2020 FINDINGS: Left subclavian approach biventricular cardiac rhythm maintenance device with leads projecting over the right atrium, right ventricle and overlying the left ventricle. Lead position remains unchanged. Stable cardiac contour. Patient is status post median sternotomy with evidence of multivessel CABG. Atherosclerotic calcifications present in the transverse aorta. The lungs are clear. No acute osseous abnormality. IMPRESSION: 1. Overall improved aeration with resolution of bibasilar atelectasis compared to 12/15/2020. 2. No acute cardiopulmonary process.  3. Ancillary findings as above. Electronically Signed   By: Jacqulynn Cadet M.D.   On: 12/19/2020 12:11   DG Chest Port 1 View  Result Date: 12/15/2020 CLINICAL DATA:  Altered mental status. EXAM:  PORTABLE CHEST 1 VIEW COMPARISON:  Comparison made with January 08, 2019. FINDINGS: Three lead cardiac pacer device, power pack over LEFT chest, leads project over the heart as on the previous study. Signs of median sternotomy for CABG. Trachea midline. Cardiomediastinal contours and hilar structures are stable. No lobar consolidation. Linear opacities overlie the RIGHT and LEFT hemidiaphragm bilaterally. No visible pneumothorax. No overt sign of effusion. On limited assessment there is no acute skeletal process. EKG leads project over the chest. IMPRESSION: Linear opacities overlie the RIGHT and LEFT hemidiaphragm bilaterally, likely atelectasis. No consolidation, effusion or edema. Electronically Signed   By: Zetta Bills M.D.   On: 12/15/2020 16:18   EEG adult  Result Date: 12/17/2020 Lora Havens, MD     12/19/2020  8:55 AM Patient Name: David [name]. MRN: [id number] Epilepsy Attending: Lora Havens Referring Physician/Provider: Dr Lesleigh Noe Duration: 12/17/2020 1641 to [place]  Patient history: 80 year old male with history of CVA, ICH, melanoma with mets to lung and brain who presented with aphasia.  EEG to evaluate for seizures.  Level of alertness: Awake, asleep  AEDs during EEG study: LEV, VPA  Technical aspects: This EEG study was done with scalp electrodes positioned according to the 10-20 International system of electrode placement. Electrical activity was acquired at a sampling rate of 500Hz  and reviewed with a high frequency filter of 70Hz  and a low frequency filter of 1Hz . EEG data were recorded continuously and digitally stored.  Description: The posterior dominant rhythm consists of 8 Hz activity of moderate voltage (25-35 uV) seen predominantly in posterior head regions, symmetric and reactive to eye opening and eye closing. Sleep was characterized by vertex waves, sleep spindles (12-14Hz ), maximal fronto-central region. EEG also showed continuous 3 to 5 Hz sharply contoured  theta-delta slowing with overriding 15 to 18 Hz beta activity in left frontal region consistent with prior craniotomy.  Seizure was noted arising from left centro-temporal region without clinical signs were noted. Onset of seizure on 12/17/2020 at 1742, ended at 1753. Concomitant EEG initially showed sharply contoured 13 to 14 Hz beta activity in left central temporal region which gradually involved all of left hemisphere followed by right hemisphere and evolved in morphology to 3 to 5 Hz theta- delta slowing.  Physiologic photic driving was not seen during photic stimulation.  Hyperventilation was not performed.    ABNORMALITY -Focal seizure, left centro- temporal region -Breach artifact, left frontal region  IMPRESSION: This study showed one seizure without clinical signs arising from left central temporal region on 12/17/2020 at 1742, ended at 1753. EEG also showed evidence of cortical dysfunction in left frontal region consistent with prior craniotomy.   Dr Curly Shores was notified  Lora Havens    EEG adult  Result Date: 12/15/2020 Lora Havens, MD     12/19/2020  8:55 AM Patient Name: David [name]. MRN: [id number] Epilepsy Attending: Lora Havens Referring Physician/Provider: Dr Lesleigh Noe Date: 12/15/2020 Duration: 20.35 mins Patient history: 80 year old male with history of CVA, ICH, melanoma with mets to lung and brain who presented with aphasia.  EEG to evaluate for seizures. Level of alertness: Awake AEDs during EEG study: LEV Technical aspects: This EEG study was done with scalp electrodes positioned according to the 10-20 International system  of electrode placement. Electrical activity was acquired at a sampling rate of 500Hz  and reviewed with a high frequency filter of 70Hz  and a low frequency filter of 1Hz . EEG data were recorded continuously and digitally stored. Description: The posterior dominant rhythm consists of 8 Hz activity of moderate voltage (25-35 uV) seen predominantly in  posterior head regions, symmetric and reactive to eye opening and eye closing.  EEG also showed continuous 3 to 5 Hz sharply contoured theta-delta slowing with overriding 15 to 18 Hz beta activity in left frontal region consistent with prior craniotomy. Seizure was noted arising from left centro-temporal region on 12/15/2020 at 1629. During seizure, patient was noted to be aphasic at the beginning and then got confused and wanted to get out of the bed to urinate. Onset of seizure on 12/15/2020 at 1629, seizure was ongoing when study ended at 1634.  Concomitant EEG initially showed sharply contoured 13 to 14 Hz beta activity in left central temporal region which gradually involved all of left hemisphere followed by right hemisphere and evolved in morphology to 3 to 5 Hz theta- delta slowing.  Physiologic photic driving was not seen during photic stimulation.  Hyperventilation was not performed.   ABNORMALITY -Focal seizure, left centro- temporal region -Breach artifact, left frontal region IMPRESSION: This study showed one seizure arising from left central temporal region on 12/15/2020 at 1629, was ongoing when the study ended at 1634.  During seizure, patient was initially not able to respond but then got confused and restless and wanting to get out of bed to urinate.  EEG also showed evidence of cortical dysfunction in left frontal region consistent with prior craniotomy. If concern for ictal-interictal activity persist, recommend long-term monitoring. Dr Curly Shores was notified Lora Havens   Overnight EEG with video  Result Date: 12/23/2020 Lora Havens, MD     12/23/2020  3:55 PM Patient Name: David [name]. MRN: [id number] Epilepsy Attending: Lora Havens Referring Physician/Provider: Dr Zeb Comfort Duration: 12/22/2020 1133 to 12/23/2020 1133  Patient history: 80 year old male with history of CVA, ICH, melanoma with mets to lung and brain who presented with aphasia.  EEG to evaluate for seizures.   Level of alertness: Awake, asleep  AEDs during EEG study: LEV, VPA, Onfi, Vimpat  Technical aspects: This EEG study was done with scalp electrodes positioned according to the 10-20 International system of electrode placement. Electrical activity was acquired at a sampling rate of 500Hz  and reviewed with a high frequency filter of 70Hz  and a low frequency filter of 1Hz . EEG data were recorded continuously and digitally stored.  Description: The posterior dominant rhythm consists of 8 Hz activity of moderate voltage (25-35 uV) seen predominantly in posterior head regions, symmetric and reactive to eye opening and eye closing. Sleep was characterized by vertex waves, sleep spindles (12-14Hz ), maximal fronto-central region. EEG also showed continuous 3 to 5 Hz sharply contoured theta-delta slowing with overriding 15 to 18 Hz beta activity in left frontal region consistent with prior craniotomy.  Sharp waves were seen in left frontal region.   ABNORMALITY -Sharp wave, left frontal region -Breach artifact, left frontal region  IMPRESSION: This study showed evidence of epileptogenicity and cortical dysfunction in left frontal region consistent with prior craniotomy. No seizures were seen during the study.  Priyanka [name]   Overnight EEG with video  Result Date: 12/19/2020 Lora Havens, MD     12/19/2020  2:36 PM Patient Name: David [name]. MRN: [id number] Epilepsy Attending: Marcelle Overlie  [name] Referring Physician/Provider: Dr Amie [place] Duration: 12/26/2020 1048 to 12/19/2020 1048  Patient history: 80 year old male with history of CVA, ICH, melanoma with mets to lung and brain who presented with aphasia.  EEG to evaluate for seizures.  Level of alertness: Awake, asleep  AEDs during EEG study: LEV, VPA  Technical aspects: This EEG study was done with scalp electrodes positioned according to the 10-20 International system of electrode placement. Electrical activity was acquired at a sampling rate of 500Hz   and reviewed with a high frequency filter of 70Hz  and a low frequency filter of 1Hz . EEG data were recorded continuously and digitally stored.  Description: The posterior dominant rhythm consists of 8 Hz activity of moderate voltage (25-35 uV) seen predominantly in posterior head regions, symmetric and reactive to eye opening and eye closing. Sleep was characterized by vertex waves, sleep spindles (12-14Hz ), maximal fronto-central region. EEG also showed continuous 3 to 5 Hz sharply contoured theta-delta slowing with overriding 15 to 18 Hz beta activity in left frontal region consistent with prior craniotomy.  19 seizures were noted arising from left centro-temporal region during which patient was unable to speak, follow commands with delay, endorsed having metallic taste in mouth. Concomitant EEG initially showed sharply contoured 13 to 14 Hz beta activity in left central temporal region which gradually involved all of left hemisphere followed by right hemisphere and evolved in morphology to 3 to 5 Hz theta- delta slowing.  Hyperventilation and photic stimulation were not performed.    ABNORMALITY -Focal seizure, left centro- temporal region -Breach artifact, left frontal region  IMPRESSION: This study showed 19 seizures arising from left central temporal region, average duration of seizures 7-10 minutes. During the seizure patient was unable to speak, follow commands with delay, endorsed having metallic taste in mouth.EEG also showed evidence of cortical dysfunction in left frontal region consistent with prior craniotomy. Lora Havens   ECHOCARDIOGRAM COMPLETE  Result Date: 12/24/2020    ECHOCARDIOGRAM REPORT   Patient Name:   David [name]. Date of Exam: 12/24/2020 Medical Rec #:  [id number]              Height:       68.0 in Accession #:    [id number]             Weight:       200.0 lb Date of Birth:  07/21/1940              BSA:          2.044 m Patient Age:    75 years               BP:            142/100 mmHg Patient Gender: M                      HR:           88 bpm. Exam Location:  Inpatient Procedure: 2D Echo, Cardiac Doppler, Color Doppler and Intracardiac            Opacification Agent                                MODIFIED REPORT:     This report was modified by Rudean Haskell MD on 12/24/2020 due to  Spelling.  Indications:     Stroke  History:         Patient has prior history of Echocardiogram examinations, most                  recent 08/27/2019. Defibrillator; Risk Factors:Hypertension and                  Sleep Apnea. H/O stroke. CKD.  Sonographer:     Clayton Lefort RDCS (AE) Referring Phys:  [id number] Lora Havens Diagnosing Phys: Rudean Haskell MD  Sonographer Comments: Technically challenging study due to limited acoustic windows, Technically difficult study due to poor echo windows, suboptimal parasternal window, suboptimal apical window and suboptimal subcostal window. IMPRESSIONS  1. Left ventricular ejection fraction, by estimation, is 55 to 60%. The left ventricle has normal function. Left ventricular endocardial border not optimally defined to evaluate regional wall motion. There is mild concentric left ventricular hypertrophy. Left ventricular diastolic parameters are indeterminate.  2. Right ventricular systolic function is normal. The right ventricular size is not well visualized. Tricuspid regurgitation signal is inadequate for assessing PA pressure.  3. The mitral valve is grossly normal. No evidence of mitral valve regurgitation.  4. The aortic valve was not well visualized. Aortic valve regurgitation is not visualized.  5. The inferior vena cava is normal in size with greater than 50% respiratory variability, suggesting right atrial pressure of 3 mmHg. Comparison(s): A prior study was performed on 08/27/2019. Difficult comparison-these images are more technically difficult than prior. FINDINGS  Left Ventricle: Left ventricular ejection  fraction, by estimation, is 55 to 60%. The left ventricle has normal function. Left ventricular endocardial border not optimally defined to evaluate regional wall motion. Definity contrast agent was given IV to delineate the left ventricular endocardial borders. The left ventricular internal cavity size was small. There is mild concentric left ventricular hypertrophy. Left ventricular diastolic parameters are indeterminate. Right Ventricle: The right ventricular size is not well visualized. Right vetricular wall thickness was not well visualized. Right ventricular systolic function is normal. Tricuspid regurgitation signal is inadequate for assessing PA pressure. Left [facility]: Left atrial size was normal in size. Right [facility]: Right atrial size was normal in size. Pericardium: There is no evidence of pericardial effusion. Presence of pericardial fat pad. Mitral Valve: The mitral valve is grossly normal. No evidence of mitral valve regurgitation. Tricuspid Valve: The tricuspid valve is normal in structure. Tricuspid valve regurgitation is not demonstrated. No evidence of tricuspid stenosis. Aortic Valve: The aortic valve was not well visualized. Aortic valve regurgitation is not visualized. Aortic valve mean gradient measures 4.0 mmHg. Aortic valve peak gradient measures 7.1 mmHg. Aortic valve area, by VTI measures 2.21 cm. Pulmonic Valve: The pulmonic valve was not well visualized. Pulmonic valve regurgitation is not visualized. Aorta: The aortic root is normal in size and structure and the ascending aorta was not well visualized. Venous: The inferior vena cava is normal in size with greater than 50% respiratory variability, suggesting right atrial pressure of 3 mmHg. IAS/Shunts: The interatrial septum was not well visualized. Additional Comments: A device lead is visualized.  LEFT VENTRICLE PLAX 2D LVIDd:         3.60 cm  Diastology LVIDs:         2.30 cm  LV e' medial:    8.05 cm/s LV PW:         1.30 cm  LV E/e'  medial:  6.2 LV IVS:        1.40 cm  LV e' lateral:   4.68 cm/s LVOT diam:     2.20 cm  LV E/e' lateral: 10.7 LV SV:         40 LV SV Index:   19 LVOT Area:     3.80 cm  IVC IVC diam: 1.20 cm LEFT ATRIUM           Index LA diam:      3.40 cm 1.66 cm/m LA Vol (A2C): 39.3 ml 19.23 ml/m LA Vol (A4C): 35.6 ml 17.42 ml/m  AORTIC VALVE AV Area (Vmax):    1.96 cm AV Area (Vmean):   1.88 cm AV Area (VTI):     2.21 cm AV Vmax:           133.00 cm/s AV Vmean:          91.400 cm/s AV VTI:            0.179 m AV Peak Grad:      7.1 mmHg AV Mean Grad:      4.0 mmHg LVOT Vmax:         68.60 cm/s LVOT Vmean:        45.100 cm/s LVOT VTI:          0.104 m LVOT/AV VTI ratio: 0.58  AORTA Ao Root diam: 3.70 cm Ao Asc diam:  3.40 cm MITRAL VALVE MV Area (PHT): 2.09 cm    SHUNTS MV Decel Time: 363 msec    Systemic VTI:  0.10 m MV E velocity: 50.00 cm/s  Systemic Diam: 2.20 cm MV A velocity: 78.20 cm/s MV E/A ratio:  0.64 Rudean Haskell MD Electronically signed by Rudean Haskell MD Signature Date/Time: 12/24/2020/5:25:24 PM    Final (Updated)    DG FL GUIDED LUMBAR PUNCTURE  Result Date: 12/24/2020 CLINICAL DATA:  Concern for meningitis either infectious or carcinomatosis in a 81 year old male. EXAM: DIAGNOSTIC LUMBAR PUNCTURE UNDER FLUOROSCOPIC GUIDANCE COMPARISON:  None FLUOROSCOPY TIME:  Fluoroscopy Time:  20 seconds Radiation Exposure Index (if provided by the fluoroscopic device): 4.5 mGy Number of Acquired Spot Images: 0 PROCEDURE: Informed consent was obtained from patient's family prior to proceeding. Following time-out in sterile prep and drape and numbing with 1% lidocaine over the superficial soft tissues, the L3-L4 interspace was accessed under fluoroscopic guidance. A 3-1/2 inch 20 gauge spinal needle was utilized yielding clear CSF. Opening pressure was grossly normal but due to Valsalva and prone positioning exact measurement was not obtained. CSF flow was best with the head of bed slightly elevated.  11 cc of clear spinal fluid was collected in 4 separate aliquots. Sample was sent for requested testing. IMPRESSION: Technically successful lumbar puncture at the L3-L4 level without signs of complication. CSF sent for requested testing. Electronically Signed   By: Zetta Bills M.D.   On: 12/24/2020 18:07     PHYSICAL EXAM  Temp:  [98.1 F (36.7 C)-99.4 F (37.4 C)] 99.4 F (37.4 C) (09/17 0907) Pulse Rate:  [76-109] 109 (09/17 1149) Resp:  [14-18] 18 (09/17 0330) BP: (144-164)/(94-115) 162/115 (09/17 1149) SpO2:  [95 %-97 %] 95 % (09/17 0907)  General - Well nourished, well developed, obtunded and not arousable.  Ophthalmologic - fundi not visualized due to noncooperation.  Cardiovascular - Regular rhythm and rate.  Neuro - eyes closed, not arousable to voice or pain stimulation.  Not follow commands not able to answer questions.  With forced eye opening, both eyes midline position, not blinking to visual threat, not tracking, PERRL.  No significant asymmetry of  nasolabial fold, on pain stimulation, no movement of all extremities, however grimace. Sensation, coordination and gait not tested.   ASSESSMENT/PLAN [name]. is a 80 y.o. 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   Seizure EEG showed focal seizure left temporal lobe Long-term EEG showed frequent focal seizures -> improved with AEDs LTM EEG 9/17 no seizure overnight Was on keppra, depakote, vimpat and onfi Decreased level of consciousness since 9/15, keppra dose decreased to 500 mg twice daily, Depakote 500 twice daily and 250 in p.m.  Vimpat 100 mg twice daily. 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 fib LDL 42 HgbA1c 5.8 Lovenox for VTE prophylaxis No antithrombotic prior to admission, now on aspirin 81 mg daily and clopidogrel 75 mg daily DAPT. 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 Sloan Leiter. I spent  35 minutes in total face-to-face time with the patient, more than 50% of which was spent in counseling and coordination of care, reviewing test results, images and medication, and discussing the diagnosis, treatment plan and potential prognosis. This patient's care requiresreview of multiple databases, neurological assessment, discussion with family, other specialists and medical decision making of high complexity.     Rosalin Hawking, MD PhD Stroke Neurology 12/25/2020 3:40 PM    To contact  Stroke Continuity provider, please refer to [email or web address] After hours, contact General Neurology

## 2020-12-25 NOTE — Progress Notes (Signed)
LTM maint complete - no skin breakdown , redness on chest where EEG's-ecg leads were previously placed. [facility] monitored, Event button test confirmed by [facility].

## 2020-12-26 ENCOUNTER — Inpatient Hospital Stay (HOSPITAL_COMMUNITY): Payer: Medicare Other

## 2020-12-26 DIAGNOSIS — Z4659 Encounter for fitting and adjustment of other gastrointestinal appliance and device: Secondary | ICD-10-CM

## 2020-12-26 DIAGNOSIS — R401 Stupor: Secondary | ICD-10-CM

## 2020-12-26 LAB — PHOSPHORUS: Phosphorus: 3.5 mg/dL (ref 2.5–4.6)

## 2020-12-26 LAB — CBC
HCT: 47.7 % (ref 39.0–52.0)
Hemoglobin: 15.5 g/dL (ref 13.0–17.0)
MCH: 31.6 pg (ref 26.0–34.0)
MCHC: 32.5 g/dL (ref 30.0–36.0)
MCV: 97.3 fL (ref 80.0–100.0)
Platelets: 142 10*3/uL — ABNORMAL LOW (ref 150–400)
RBC: 4.9 MIL/uL (ref 4.22–5.81)
RDW: 14.6 % (ref 11.5–15.5)
WBC: 8.1 10*3/uL (ref 4.0–10.5)
nRBC: 0 % (ref 0.0–0.2)

## 2020-12-26 LAB — COMPREHENSIVE METABOLIC PANEL
ALT: 15 U/L (ref 0–44)
AST: 18 U/L (ref 15–41)
Albumin: 2.2 g/dL — ABNORMAL LOW (ref 3.5–5.0)
Alkaline Phosphatase: 41 U/L (ref 38–126)
Anion gap: 9 (ref 5–15)
BUN: 63 mg/dL — ABNORMAL HIGH (ref 8–23)
CO2: 19 mmol/L — ABNORMAL LOW (ref 22–32)
Calcium: 8.1 mg/dL — ABNORMAL LOW (ref 8.9–10.3)
Chloride: 123 mmol/L — ABNORMAL HIGH (ref 98–111)
Creatinine, Ser: 2.72 mg/dL — ABNORMAL HIGH (ref 0.61–1.24)
GFR, Estimated: 23 mL/min — ABNORMAL LOW (ref >60)
Glucose, Bld: 194 mg/dL — ABNORMAL HIGH (ref 70–99)
Potassium: 4.2 mmol/L (ref 3.5–5.1)
Sodium: 151 mmol/L — ABNORMAL HIGH (ref 135–145)
Total Bilirubin: 0.5 mg/dL (ref 0.3–1.2)
Total Protein: 5.3 g/dL — ABNORMAL LOW (ref 6.5–8.1)

## 2020-12-26 LAB — GLUCOSE, CAPILLARY
Glucose-Capillary: 169 mg/dL — ABNORMAL HIGH (ref 70–99)
Glucose-Capillary: 170 mg/dL — ABNORMAL HIGH (ref 70–99)
Glucose-Capillary: 170 mg/dL — ABNORMAL HIGH (ref 70–99)
Glucose-Capillary: 188 mg/dL — ABNORMAL HIGH (ref 70–99)
Glucose-Capillary: 193 mg/dL — ABNORMAL HIGH (ref 70–99)
Glucose-Capillary: 219 mg/dL — ABNORMAL HIGH (ref 70–99)

## 2020-12-26 LAB — AMMONIA: Ammonia: 42 umol/L — ABNORMAL HIGH (ref 9–35)

## 2020-12-26 LAB — MAGNESIUM: Magnesium: 2.8 mg/dL — ABNORMAL HIGH (ref 1.7–2.4)

## 2020-12-26 LAB — CULTURE, BLOOD (ROUTINE X 2)
Culture: NO GROWTH
Special Requests: ADEQUATE

## 2020-12-26 MED ORDER — CHLORHEXIDINE GLUCONATE CLOTH 2 % EX PADS
6.0000 | MEDICATED_PAD | Freq: Every day | CUTANEOUS | Status: DC
  Administered 2020-12-26 – 2021-01-02 (×5): 6 via TOPICAL

## 2020-12-26 MED ORDER — TAMSULOSIN HCL 0.4 MG PO CAPS
0.4000 mg | ORAL_CAPSULE | Freq: Every day | ORAL | Status: DC
  Administered 2020-12-26 – 2021-01-02 (×5): 0.4 mg via ORAL
  Filled 2020-12-26 (×6): qty 1

## 2020-12-26 MED ORDER — FREE WATER
400.0000 mL | Status: DC
  Administered 2020-12-26 – 2020-12-27 (×6): 400 mL

## 2020-12-26 NOTE — Procedures (Addendum)
Patient Name: David [name].  MRN: [id number]  Epilepsy Attending: Lora Havens  Referring Physician/Provider: Dr Zeb Comfort Duration: 12/25/2020 1133 to 12/26/2020 1133   Patient history: 80 year old male with history of CVA, ICH, melanoma with mets to lung and brain who presented with aphasia.  EEG to evaluate for seizures.   Level of alertness: lethargic   AEDs during EEG study: LEV, VPA, Vimpat   Technical aspects: This EEG study was done with scalp electrodes positioned according to the 10-20 International system of electrode placement. Electrical activity was acquired at a sampling rate of 500Hz  and reviewed with a high frequency filter of 70Hz  and a low frequency filter of 1Hz . EEG data were recorded continuously and digitally stored.    Description: EEG showed continuous generalized 3-5hz  theta-delta slowing. There was also 3 to 5 Hz sharply contoured theta-delta slowing with overriding 15 to 18 Hz beta activity in left frontal region consistent with prior craniotomy.  Abundant qasi periodic sharp waves were seen in left frontal region.      ABNORMALITY -Sharp wave, left frontal region -Breach artifact, left frontal region   IMPRESSION: This study showed  evidence of epileptogenicity and cortical dysfunction in left frontal region consistent with prior craniotomy. No definite seizures were seen during this study.      Athenia Rys [name]

## 2020-12-26 NOTE — Progress Notes (Signed)
SLP Cancellation Note  Patient Details Name: David [name]. MRN: [id number] DOB: 08-20-1940   Cancelled treatment:       Reason Eval/Treat Not Completed: Patient's level of consciousness. SLP unable to arouse patient in AM and per RN he has not been awake. SLP will continue to follow patient for readiness to participate in BSE. Continue NPO status.    Sonia Baller, MA, CCC-SLP Speech Therapy [facility]

## 2020-12-26 NOTE — Progress Notes (Signed)
STROKE TEAM PROGRESS NOTE   SUBJECTIVE (INTERVAL HISTORY) No family at the bedside. Pt still unresponsive and not able to be aroused. Grimace on pain stimulation. Eyes midposition, not moving extremities spontaneously. CT repeat showed no acute abnormalities, the previous seen right brachium pontis hypodensity likely artifact. Still on LTM EEG and no seizure recorded. Of note, pt BUN, Cre, and Na continue to trending up, ammonia is trending down.   OBJECTIVE Temp:  [98.2 F (36.8 C)-98.9 F (37.2 C)] 98.7 F (37.1 C) (09/18 1146) Pulse Rate:  [81-99] 84 (09/18 1205) Cardiac Rhythm: Ventricular paced;Bundle branch block (09/18 0730) Resp:  [16-23] 19 (09/18 1205) BP: (134-156)/(87-108) 136/87 (09/18 1146) SpO2:  [90 %-94 %] 94 % (09/18 1205)  Recent Labs  Lab 12/25/20 2341 12/26/20 0402 12/26/20 0721 12/26/20 1112 12/26/20 1507  GLUCAP 282* 219* 170* 169* 193*   Recent Labs  Lab 12/20/20 0316 12/21/20 0643 12/22/20 0404 12/23/20 0302 12/24/20 0255 12/24/20 0909 12/24/20 1650 12/25/20 0605 12/25/20 1631 12/26/20 0551 12/26/20 0740  NA 139   < > 143 144 147* 151*  --  150*  --   --  151*  K 5.1   < > 5.3* 4.9 4.3 4.1  --  4.3  --   --  4.2  CL 107   < > 103 112* 115*  --   --  118*  --   --  123*  CO2 22   < > 23 21* 20*  --   --  17*  --   --  19*  GLUCOSE 90   < > 116* 129* 174*  --   --  218*  --   --  194*  BUN 46*   < > 47* 44* 44*  --   --  54*  --   --  63*  CREATININE 1.97*   < > 2.02* 2.12* 2.14*  --   --  2.30*  --   --  2.72*  CALCIUM 9.2   < > 9.3 8.8* 9.1  --   --  8.5*  --   --  8.1*  MG 2.5*  --   --   --   --   --  2.2 2.4 2.7* 2.8*  --   PHOS  --   --   --   --   --   --  3.5 2.6 3.2 3.5  --    < > = values in this interval not displayed.   Recent Labs  Lab 12/23/20 0302 12/24/20 0255 12/26/20 0740  AST 41 27 18  ALT 35 26 15  ALKPHOS 44 45 41  BILITOT 0.8 0.6 0.5  PROT 6.0* 6.2* 5.3*  ALBUMIN 3.0* 2.9* 2.2*   Recent Labs  Lab  12/22/20 0404 12/23/20 0302 12/24/20 0255 12/24/20 0909 12/25/20 0605 12/26/20 0740  WBC 4.5 5.6 6.4  --  8.4 8.1  HGB 17.6* 16.6 16.8 16.3 16.6 15.5  HCT 54.0* 50.6 52.4* 48.0 51.0 47.7  MCV 96.1 96.4 96.1  --  97.0 97.3  PLT PLATELET CLUMPS NOTED ON SMEAR, UNABLE TO ESTIMATE 127* 159  --  150 142*   No results for input(s): CKTOTAL, CKMB, CKMBINDEX, TROPONINI in the last 168 hours. No results for input(s): LABPROT, INR in the last 72 hours. No results for input(s): COLORURINE, LABSPEC, [place], GLUCOSEU, HGBUR, BILIRUBINUR, KETONESUR, PROTEINUR, UROBILINOGEN, NITRITE, LEUKOCYTESUR in the last 72 hours.  Invalid input(s): APPERANCEUR     Component Value Date/Time   CHOL 108  12/24/2020 1903   TRIG 161 (H) 12/24/2020 1903   HDL 34 (L) 12/24/2020 1903   CHOLHDL 3.2 12/24/2020 1903   VLDL 32 12/24/2020 1903   LDLCALC 42 12/24/2020 1903   Lab Results  Component Value Date   HGBA1C 5.8 (H) 12/24/2020   No results found for: LABOPIA, COCAINSCRNUR, LABBENZ, AMPHETMU, THCU, LABBARB  No results for input(s): ETH in the last 168 hours.  I have personally reviewed the radiological images below and agree with the radiology interpretations.  CT ANGIO HEAD W OR WO CONTRAST  Result Date: 12/24/2020 CLINICAL DATA:  Neuro deficit, acute, stroke suspected; Stroke/TIA, assess intracranial arteries EXAM: CT ANGIOGRAPHY HEAD AND NECK TECHNIQUE: Multidetector CT imaging of the head and neck was performed using the standard protocol during bolus administration of intravenous contrast. Multiplanar CT image reconstructions and MIPs were obtained to evaluate the vascular anatomy. Carotid stenosis measurements (when applicable) are obtained utilizing NASCET criteria, using the distal internal carotid diameter as the denominator. CONTRAST:  54mL OMNIPAQUE IOHEXOL 350 MG/ML SOLN COMPARISON:  CTA Aug 23, 2015.  Same day CT head. FINDINGS: CTA NECK FINDINGS Aortic arch: Atherosclerosis of the carotid  bifurcation and great vessel origins. Great vessel origins are patent. Right carotid system: Mild mixed calcific and noncalcific atherosclerosis without greater than 50% stenosis. Tortuous ICA. Left carotid system: Moderate mixed calcific and noncalcific atherosclerosis at the carotid bifurcation without greater than 50% stenosis. Vertebral arteries: Left dominant. The right vertebral artery is small throughout its course. Mild multifocal narrowing of the left vertebral artery due to mass effect from adjacent osteophytes. No greater than 50% stenosis. Skeleton: Severe degenerative disease in the lower cervical spine. Other neck: No acute abnormality. Upper chest: Visualized lung apices are clear. Review of the MIP images confirms the above findings CTA HEAD FINDINGS Anterior circulation: Bilateral intracranial ICAs, MCAs, and ACAs are patent without proximal hemodynamically significant stenosis. No aneurysm identified. Posterior circulation: Moderate stenosis of the non dominant right vertebral artery, similar to 2017.Bilateral PICAs are patent. Left vertebral artery is dominant and patent without significant stenosis. The basilar artery and both posterior cerebral arteries are patent without proximal hemodynamically significant stenosis. No aneurysm identified. Venous sinuses: As permitted by contrast timing, patent. Anatomic variants: None. Review of the MIP images confirms the above findings IMPRESSION: CTA head: 1. No large vessel occlusion. 2. Moderate stenosis of the right intradural vertebral artery, similar to 2017. CTA neck : No significant (greater than 50%) stenosis. Electronically Signed   By: Margaretha Sheffield M.D.   On: 12/24/2020 15:20   DG Abd 1 View  Result Date: 12/24/2020 CLINICAL DATA:  Feeding tube placement EXAM: ABDOMEN - 1 VIEW COMPARISON:  CT abdomen 01/05/2019 FINDINGS: Pacer lead noted.  Lower sternotomy wire loaded. The feeding tube projects in the vicinity of the pylorus and could be  in the distal stomach antrum or duodenal bulb. No dilated bowel identified.  Lumbar spondylosis noted. IMPRESSION: 1. Feeding tube tip is periportal or can could be in the distal stomach antrum or duodenal bulb. 2. Pacer device noted. Electronically Signed   By: Van Clines M.D.   On: 12/24/2020 18:34   CT HEAD WO CONTRAST (5MM)  Result Date: 12/26/2020 CLINICAL DATA:  Stroke, follow up. EXAM: CT HEAD WITHOUT CONTRAST TECHNIQUE: Contiguous axial images were obtained from the base of the skull through the vertex without intravenous contrast. COMPARISON:  12/24/2020 FINDINGS: Brain: Hypoattenuation involving the right middle cerebellar peduncle and adjacent cerebellar hemisphere on the prior CT is not present  on today's study favoring artifact on the prior exam. There is no evidence of an acute infarct, intracranial hemorrhage, mass, midline shift, or extra-axial fluid collection. Extensive encephalomalacia is again noted in the left frontal lobe with ex vacuo dilatation of the left lateral ventricle. Hypodensities elsewhere in the cerebral white matter bilaterally are unchanged and nonspecific but compatible with mild chronic small vessel ischemic disease. Vascular: No hyperdense vessel. Skull: Left frontal craniotomy. Sinuses/Orbits: Visualized paranasal sinuses and mastoid air cells are clear. Bilateral cataract extraction. Left scleral buckle. Other: None. IMPRESSION: 1. No evidence of acute intracranial abnormality. 2. Low density in the right middle cerebellar peduncle on the prior CT is no longer evident and was likely artifactual. Electronically Signed   By: Logan Bores M.D.   On: 12/26/2020 13:47   CT HEAD WO CONTRAST (5MM)  Result Date: 12/24/2020 CLINICAL DATA:  Seizure, abnormal neuro exam EXAM: CT HEAD WITHOUT CONTRAST TECHNIQUE: Contiguous axial images were obtained from the base of the skull through the vertex without intravenous contrast. COMPARISON:  12/21/2020 FINDINGS: Brain: No  acute intracranial hemorrhage. There is a new area of hypoattenuation involving the right brachium pontis and adjacent cerebellum (series 3, image 10). Left frontal encephalomalacia underlying craniotomy with ex vacuo dilatation of the left lateral ventricle. Ventricles are stable in size. Stable findings of chronic microvascular ischemic changes in the cerebral white matter. Vascular: No hyperdense vessel. Skull: Left craniotomy. Sinuses/Orbits: No acute finding. Other: None. IMPRESSION: New low-density involving right brachium pontis and adjacent cerebellum suspicious for acute or subacute infarction. No acute intracranial hemorrhage. Otherwise stable findings detailed above. These results will be called to the ordering clinician or representative by the Radiologist Assistant, and communication documented in the PACS or [employer]. Electronically Signed   By: Macy Mis M.D.   On: 12/24/2020 13:14   CT HEAD WO CONTRAST (5MM)  Result Date: 12/21/2020 CLINICAL DATA:  Mental status change, unknown cause EXAM: CT HEAD WITHOUT CONTRAST TECHNIQUE: Contiguous axial images were obtained from the base of the skull through the vertex without intravenous contrast. COMPARISON:  12/15/2020. FINDINGS: Brain: Similar postsurgical changes of left frontal craniotomy with subjacent encephalomalacia and gliosis. Similar ex vacuo ventricular dilation of the adjacent frontal horn of the left lateral ventricle no evidence of acute large vascular territory infarct, acute hemorrhage, hydrocephalus, mass lesion, extra-axial fluid collection or midline shift. Similar generalized atrophy with ex vacuo ventricular dilation. Additional patchy white matter hypoattenuation, nonspecific but compatible with chronic microvascular ischemic disease. Vascular: No hyperdense vessel identified. Calcific intracranial atherosclerosis. Skull: No acute fracture.  Left frontal craniotomy. Sinuses/Orbits: Clear sinuses. Postsurgical changes of  the left globe. No evidence of acute orbital findin abnormality. G Other: No mastoid effusions. IMPRESSION: No evidence of acute intracranial abnormality. Electronically Signed   By: Margaretha Sheffield M.D.   On: 12/21/2020 16:16   CT HEAD WO CONTRAST  Result Date: 12/15/2020 CLINICAL DATA:  History of CVA, concern for TIAs EXAM: CT HEAD WITHOUT CONTRAST TECHNIQUE: Contiguous axial images were obtained from the base of the skull through the vertex without intravenous contrast. COMPARISON:  11/22/2020 FINDINGS: Brain: Status post left frontal craniotomy with subjacent encephalomalacia. Unchanged appearance of the resection cavity. Unchanged ex vacuo dilatation of the frontal horn of the left lateral ventricle. Evidence of acute infarction, hemorrhage, hydrocephalus, extra-axial collection, mass, mass effect, or midline shift. Vascular: No hyperdense vessel or unexpected calcification. Skull: Prior left frontal craniotomy. No aggressive osseous lesions. Sinuses/Orbits: Left scleral band. Status post bilateral lens replacements. Normal paranasal  sinuses. Other: The mastoids are well aerated. IMPRESSION: No acute intracranial process. Electronically Signed   By: Merilyn Baba M.D.   On: 12/15/2020 13:46   CT ANGIO NECK W OR WO CONTRAST  Result Date: 12/24/2020 CLINICAL DATA:  Neuro deficit, acute, stroke suspected; Stroke/TIA, assess intracranial arteries EXAM: CT ANGIOGRAPHY HEAD AND NECK TECHNIQUE: Multidetector CT imaging of the head and neck was performed using the standard protocol during bolus administration of intravenous contrast. Multiplanar CT image reconstructions and MIPs were obtained to evaluate the vascular anatomy. Carotid stenosis measurements (when applicable) are obtained utilizing NASCET criteria, using the distal internal carotid diameter as the denominator. CONTRAST:  22mL OMNIPAQUE IOHEXOL 350 MG/ML SOLN COMPARISON:  CTA Aug 23, 2015.  Same day CT head. FINDINGS: CTA NECK FINDINGS Aortic  arch: Atherosclerosis of the carotid bifurcation and great vessel origins. Great vessel origins are patent. Right carotid system: Mild mixed calcific and noncalcific atherosclerosis without greater than 50% stenosis. Tortuous ICA. Left carotid system: Moderate mixed calcific and noncalcific atherosclerosis at the carotid bifurcation without greater than 50% stenosis. Vertebral arteries: Left dominant. The right vertebral artery is small throughout its course. Mild multifocal narrowing of the left vertebral artery due to mass effect from adjacent osteophytes. No greater than 50% stenosis. Skeleton: Severe degenerative disease in the lower cervical spine. Other neck: No acute abnormality. Upper chest: Visualized lung apices are clear. Review of the MIP images confirms the above findings CTA HEAD FINDINGS Anterior circulation: Bilateral intracranial ICAs, MCAs, and ACAs are patent without proximal hemodynamically significant stenosis. No aneurysm identified. Posterior circulation: Moderate stenosis of the non dominant right vertebral artery, similar to 2017.Bilateral PICAs are patent. Left vertebral artery is dominant and patent without significant stenosis. The basilar artery and both posterior cerebral arteries are patent without proximal hemodynamically significant stenosis. No aneurysm identified. Venous sinuses: As permitted by contrast timing, patent. Anatomic variants: None. Review of the MIP images confirms the above findings IMPRESSION: CTA head: 1. No large vessel occlusion. 2. Moderate stenosis of the right intradural vertebral artery, similar to 2017. CTA neck : No significant (greater than 50%) stenosis. Electronically Signed   By: Margaretha Sheffield M.D.   On: 12/24/2020 15:20   NM PET Image Restage (PS) Whole Body (F-18 FDG)  Result Date: 12/11/2020 CLINICAL DATA:  Subsequent treatment strategy for melanoma. EXAM: NUCLEAR MEDICINE PET WHOLE BODY TECHNIQUE: 10.7 mCi F-18 FDG was injected intravenously.  Full-ring PET imaging was performed from the head to foot after the radiotracer. CT data was obtained and used for attenuation correction and anatomic localization. Fasting blood glucose: 88 mg/dl COMPARISON:  FDG PET scan 07/27/2019 FINDINGS: Mediastinal blood pool activity: SUV max 2.5 HEAD/NECK: Large field of photopenia in the LEFT frontal lobe corresponds to encephalomalacia on prior CT. Patient status post LEFT frontal craniotomy. No hypermetabolic lymph nodes or skin lesions in the head or neck. Incidental CT findings: none CHEST: No hypermetabolic mediastinal or hilar nodes. No suspicious pulmonary nodules on the CT scan. Incidental CT findings: Small focus of metabolic activity in the LEFT lobe of thyroid gland is unchanged (SUV max equal 5.8). Post CABG ABDOMEN/PELVIS: No abnormal hypermetabolic activity within the liver, pancreas, adrenal glands, or spleen. No hypermetabolic lymph nodes in the abdomen or pelvis. Incidental CT findings: Prostate unremarkable. Atherosclerotic calcification of the aorta. SKELETON: No focal hypermetabolic activity to suggest skeletal metastasis. Incidental CT findings: none EXTREMITIES: No abnormal hypermetabolic activity in the lower extremities. Incidental CT findings: none IMPRESSION: 1. No evidence of metastatic melanoma on  whole-body FDG PET scan. 2. LEFT frontal craniotomy. 3. Hypermetabolic activity in small LEFT thyroid nodules unchanged. Consider thyroid ultrasound. Electronically Signed   By: Suzy Bouchard M.D.   On: 12/11/2020 09:56   DG Chest Port 1 View  Result Date: 12/25/2020 CLINICAL DATA:  Evaluate for consolidation.  Shortness of breath. EXAM: PORTABLE CHEST 1 VIEW COMPARISON:  None. FINDINGS: A feeding tube terminates below today's film. A 3 lead pacemaker is identified. There is a tortuous thoracic aorta with atherosclerotic change. The heart, hila, and mediastinum are otherwise unremarkable. No pneumothorax. The lungs are clear. IMPRESSION: No  acute abnormalities are identified. Electronically Signed   By: Dorise Bullion III M.D.   On: 12/25/2020 08:58   DG CHEST PORT 1 VIEW  Result Date: 12/21/2020 CLINICAL DATA:  Sudden onset of fever. EXAM: PORTABLE CHEST 1 VIEW COMPARISON:  Chest x-ray 12/19/2020, CT chest 01/05/2019 FINDINGS: The heart and mediastinal contours are unchanged. Aortic calcification. Three lead cardiac pacemaker in similar position. Cardiac surgical changes again noted. No focal consolidation. No pulmonary edema. No pleural effusion. No pneumothorax. No acute osseous abnormality.  Intact sternotomy wires. IMPRESSION: No active disease. Electronically Signed   By: Iven Finn M.D.   On: 12/21/2020 17:08   DG CHEST PORT 1 VIEW  Result Date: 12/19/2020 CLINICAL DATA:  Cough, confusion, slurred speech EXAM: PORTABLE CHEST 1 VIEW COMPARISON:  Prior chest x-ray 12/15/2020 FINDINGS: Left subclavian approach biventricular cardiac rhythm maintenance device with leads projecting over the right atrium, right ventricle and overlying the left ventricle. Lead position remains unchanged. Stable cardiac contour. Patient is status post median sternotomy with evidence of multivessel CABG. Atherosclerotic calcifications present in the transverse aorta. The lungs are clear. No acute osseous abnormality. IMPRESSION: 1. Overall improved aeration with resolution of bibasilar atelectasis compared to 12/15/2020. 2. No acute cardiopulmonary process. 3. Ancillary findings as above. Electronically Signed   By: Jacqulynn Cadet M.D.   On: 12/19/2020 12:11   DG Chest Port 1 View  Result Date: 12/15/2020 CLINICAL DATA:  Altered mental status. EXAM: PORTABLE CHEST 1 VIEW COMPARISON:  Comparison made with January 08, 2019. FINDINGS: Three lead cardiac pacer device, power pack over LEFT chest, leads project over the heart as on the previous study. Signs of median sternotomy for CABG. Trachea midline. Cardiomediastinal contours and hilar structures are  stable. No lobar consolidation. Linear opacities overlie the RIGHT and LEFT hemidiaphragm bilaterally. No visible pneumothorax. No overt sign of effusion. On limited assessment there is no acute skeletal process. EKG leads project over the chest. IMPRESSION: Linear opacities overlie the RIGHT and LEFT hemidiaphragm bilaterally, likely atelectasis. No consolidation, effusion or edema. Electronically Signed   By: Zetta Bills M.D.   On: 12/15/2020 16:18   EEG adult  Result Date: 12/17/2020 Lora Havens, MD     12/19/2020  8:55 AM Patient Name: David [name]. MRN: [id number] Epilepsy Attending: Lora Havens Referring Physician/Provider: Dr Lesleigh Noe Duration: 12/17/2020 1641 to [place]  Patient history: 80 year old male with history of CVA, ICH, melanoma with mets to lung and brain who presented with aphasia.  EEG to evaluate for seizures.  Level of alertness: Awake, asleep  AEDs during EEG study: LEV, VPA  Technical aspects: This EEG study was done with scalp electrodes positioned according to the 10-20 International system of electrode placement. Electrical activity was acquired at a sampling rate of 500Hz  and reviewed with a high frequency filter of 70Hz  and a low frequency filter of 1Hz . EEG data were  recorded continuously and digitally stored.  Description: The posterior dominant rhythm consists of 8 Hz activity of moderate voltage (25-35 uV) seen predominantly in posterior head regions, symmetric and reactive to eye opening and eye closing. Sleep was characterized by vertex waves, sleep spindles (12-14Hz ), maximal fronto-central region. EEG also showed continuous 3 to 5 Hz sharply contoured theta-delta slowing with overriding 15 to 18 Hz beta activity in left frontal region consistent with prior craniotomy.  Seizure was noted arising from left centro-temporal region without clinical signs were noted. Onset of seizure on 12/17/2020 at 1742, ended at 1753. Concomitant EEG initially showed sharply  contoured 13 to 14 Hz beta activity in left central temporal region which gradually involved all of left hemisphere followed by right hemisphere and evolved in morphology to 3 to 5 Hz theta- delta slowing.  Physiologic photic driving was not seen during photic stimulation.  Hyperventilation was not performed.    ABNORMALITY -Focal seizure, left centro- temporal region -Breach artifact, left frontal region  IMPRESSION: This study showed one seizure without clinical signs arising from left central temporal region on 12/17/2020 at 1742, ended at 1753. EEG also showed evidence of cortical dysfunction in left frontal region consistent with prior craniotomy.   Dr Curly Shores was notified  Lora Havens    EEG adult  Result Date: 12/15/2020 Lora Havens, MD     12/19/2020  8:55 AM Patient Name: David [name]. MRN: [id number] Epilepsy Attending: Lora Havens Referring Physician/Provider: Dr Lesleigh Noe Date: 12/15/2020 Duration: 20.35 mins Patient history: 80 year old male with history of CVA, ICH, melanoma with mets to lung and brain who presented with aphasia.  EEG to evaluate for seizures. Level of alertness: Awake AEDs during EEG study: LEV Technical aspects: This EEG study was done with scalp electrodes positioned according to the 10-20 International system of electrode placement. Electrical activity was acquired at a sampling rate of 500Hz  and reviewed with a high frequency filter of 70Hz  and a low frequency filter of 1Hz . EEG data were recorded continuously and digitally stored. Description: The posterior dominant rhythm consists of 8 Hz activity of moderate voltage (25-35 uV) seen predominantly in posterior head regions, symmetric and reactive to eye opening and eye closing.  EEG also showed continuous 3 to 5 Hz sharply contoured theta-delta slowing with overriding 15 to 18 Hz beta activity in left frontal region consistent with prior craniotomy. Seizure was noted arising from left centro-temporal  region on 12/15/2020 at 1629. During seizure, patient was noted to be aphasic at the beginning and then got confused and wanted to get out of the bed to urinate. Onset of seizure on 12/15/2020 at 1629, seizure was ongoing when study ended at 1634.  Concomitant EEG initially showed sharply contoured 13 to 14 Hz beta activity in left central temporal region which gradually involved all of left hemisphere followed by right hemisphere and evolved in morphology to 3 to 5 Hz theta- delta slowing.  Physiologic photic driving was not seen during photic stimulation.  Hyperventilation was not performed.   ABNORMALITY -Focal seizure, left centro- temporal region -Breach artifact, left frontal region IMPRESSION: This study showed one seizure arising from left central temporal region on 12/15/2020 at 1629, was ongoing when the study ended at 1634.  During seizure, patient was initially not able to respond but then got confused and restless and wanting to get out of bed to urinate.  EEG also showed evidence of cortical dysfunction in left frontal region consistent with prior craniotomy. If  concern for ictal-interictal activity persist, recommend long-term monitoring. Dr Curly Shores was notified Lora Havens   Overnight EEG with video  Result Date: 12/23/2020 Lora Havens, MD     12/23/2020  3:55 PM Patient Name: David [name]. MRN: [id number] Epilepsy Attending: Lora Havens Referring Physician/Provider: Dr Zeb Comfort Duration: 12/22/2020 1133 to 12/23/2020 1133  Patient history: 80 year old male with history of CVA, ICH, melanoma with mets to lung and brain who presented with aphasia.  EEG to evaluate for seizures.  Level of alertness: Awake, asleep  AEDs during EEG study: LEV, VPA, Onfi, Vimpat  Technical aspects: This EEG study was done with scalp electrodes positioned according to the 10-20 International system of electrode placement. Electrical activity was acquired at a sampling rate of 500Hz  and reviewed with  a high frequency filter of 70Hz  and a low frequency filter of 1Hz . EEG data were recorded continuously and digitally stored.  Description: The posterior dominant rhythm consists of 8 Hz activity of moderate voltage (25-35 uV) seen predominantly in posterior head regions, symmetric and reactive to eye opening and eye closing. Sleep was characterized by vertex waves, sleep spindles (12-14Hz ), maximal fronto-central region. EEG also showed continuous 3 to 5 Hz sharply contoured theta-delta slowing with overriding 15 to 18 Hz beta activity in left frontal region consistent with prior craniotomy.  Sharp waves were seen in left frontal region.   ABNORMALITY -Sharp wave, left frontal region -Breach artifact, left frontal region  IMPRESSION: This study showed evidence of epileptogenicity and cortical dysfunction in left frontal region consistent with prior craniotomy. No seizures were seen during the study.  Priyanka [name]   Overnight EEG with video  Result Date: 12/19/2020 Lora Havens, MD     12/19/2020  2:36 PM Patient Name: David [name]. 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  EEG to evaluate for seizures.  Level of alertness: Awake, asleep  AEDs during EEG study: LEV, VPA  Technical aspects: This EEG study was done with scalp electrodes positioned according to the 10-20 International system of electrode placement. Electrical activity was acquired at a sampling rate of 500Hz  and reviewed with a high frequency filter of 70Hz  and a low frequency filter of 1Hz . EEG data were recorded continuously and digitally stored.  Description: The posterior dominant rhythm consists of 8 Hz activity of moderate voltage (25-35 uV) seen predominantly in posterior head regions, symmetric and  reactive to eye opening and eye closing. Sleep was characterized by vertex waves, sleep spindles (12-14Hz ), maximal fronto-central region. EEG also showed continuous 3 to 5 Hz sharply contoured theta-delta slowing with overriding 15 to 18 Hz beta activity in left frontal region consistent with prior craniotomy.  19 seizures were noted arising from left centro-temporal region during which patient was unable to speak, follow commands with delay, endorsed having metallic taste in mouth. Concomitant EEG initially showed sharply contoured 13 to 14 Hz beta activity in left central temporal region which gradually involved all of left hemisphere followed by right hemisphere and evolved in morphology to 3 to 5 Hz theta- delta slowing.  Hyperventilation and photic stimulation were not performed.    ABNORMALITY -Focal seizure, left centro- temporal region -Breach artifact, left frontal region  IMPRESSION: This study showed 19 seizures arising from left central temporal region, average duration  of seizures 7-10 minutes. During the seizure patient was unable to speak, follow commands with delay, endorsed having metallic taste in mouth.EEG also showed evidence of cortical dysfunction in left frontal region consistent with prior craniotomy. Lora Havens   ECHOCARDIOGRAM COMPLETE  Result Date: 12/24/2020    ECHOCARDIOGRAM REPORT   Patient Name:   David [name]. Date of Exam: 12/24/2020 Medical Rec #:  [id number]              Height:       68.0 in Accession #:    [id number]             Weight:       200.0 lb Date of Birth:  1941-01-06              BSA:          2.044 m Patient Age:    30 years               BP:           142/100 mmHg Patient Gender: M                      HR:           88 bpm. Exam Location:  Inpatient Procedure: 2D Echo, Cardiac Doppler, Color Doppler and Intracardiac            Opacification Agent                                MODIFIED REPORT:     This report was modified by Rudean Haskell MD  on 12/24/2020 due to                                    Spelling.  Indications:     Stroke  History:         Patient has prior history of Echocardiogram examinations, most                  recent 08/27/2019. Defibrillator; Risk Factors:Hypertension and                  Sleep Apnea. H/O stroke. CKD.  Sonographer:     Clayton Lefort RDCS (AE) Referring Phys:  [id number] Lora Havens Diagnosing Phys: Rudean Haskell MD  Sonographer Comments: Technically challenging study due to limited acoustic windows, Technically difficult study due to poor echo windows, suboptimal parasternal window, suboptimal apical window and suboptimal subcostal window. IMPRESSIONS  1. Left ventricular ejection fraction, by estimation, is 55 to 60%. The left ventricle has normal function. Left ventricular endocardial border not optimally defined to evaluate regional wall motion. There is mild concentric left ventricular hypertrophy. Left ventricular diastolic parameters are indeterminate.  2. Right ventricular systolic function is normal. The right ventricular size is not well visualized. Tricuspid regurgitation signal is inadequate for assessing PA pressure.  3. The mitral valve is grossly normal. No evidence of mitral valve regurgitation.  4. The aortic valve was not well visualized. Aortic valve regurgitation is not visualized.  5. The inferior vena cava is normal in size with greater than 50% respiratory variability, suggesting right atrial pressure of 3 mmHg. Comparison(s): A prior study was performed on 08/27/2019. Difficult comparison-these images are more technically difficult than prior. FINDINGS  Left Ventricle: Left ventricular ejection fraction,  by estimation, is 55 to 60%. The left ventricle has normal function. Left ventricular endocardial border not optimally defined to evaluate regional wall motion. Definity contrast agent was given IV to delineate the left ventricular endocardial borders. The left ventricular internal cavity size  was small. There is mild concentric left ventricular hypertrophy. Left ventricular diastolic parameters are indeterminate. Right Ventricle: The right ventricular size is not well visualized. Right vetricular wall thickness was not well visualized. Right ventricular systolic function is normal. Tricuspid regurgitation signal is inadequate for assessing PA pressure. Left Atrium: Left atrial size was normal in size. Right Atrium: Right atrial size was normal in size. Pericardium: There is no evidence of pericardial effusion. Presence of pericardial fat pad. Mitral Valve: The mitral valve is grossly normal. No evidence of mitral valve regurgitation. Tricuspid Valve: The tricuspid valve is normal in structure. Tricuspid valve regurgitation is not demonstrated. No evidence of tricuspid stenosis. Aortic Valve: The aortic valve was not well visualized. Aortic valve regurgitation is not visualized. Aortic valve mean gradient measures 4.0 mmHg. Aortic valve peak gradient measures 7.1 mmHg. Aortic valve area, by VTI measures 2.21 cm. Pulmonic Valve: The pulmonic valve was not well visualized. Pulmonic valve regurgitation is not visualized. Aorta: The aortic root is normal in size and structure and the ascending aorta was not well visualized. Venous: The inferior vena cava is normal in size with greater than 50% respiratory variability, suggesting right atrial pressure of 3 mmHg. IAS/Shunts: The interatrial septum was not well visualized. Additional Comments: A device lead is visualized.  LEFT VENTRICLE PLAX 2D LVIDd:         3.60 cm  Diastology LVIDs:         2.30 cm  LV e' medial:    8.05 cm/s LV PW:         1.30 cm  LV E/e' medial:  6.2 LV IVS:        1.40 cm  LV e' lateral:   4.68 cm/s LVOT diam:     2.20 cm  LV E/e' lateral: 10.7 LV SV:         40 LV SV Index:   19 LVOT Area:     3.80 cm  IVC IVC diam: 1.20 cm LEFT ATRIUM           Index LA diam:      3.40 cm 1.66 cm/m LA Vol (A2C): 39.3 ml 19.23 ml/m LA Vol (A4C):  35.6 ml 17.42 ml/m  AORTIC VALVE AV Area (Vmax):    1.96 cm AV Area (Vmean):   1.88 cm AV Area (VTI):     2.21 cm AV Vmax:           133.00 cm/s AV Vmean:          91.400 cm/s AV VTI:            0.179 m AV Peak Grad:      7.1 mmHg AV Mean Grad:      4.0 mmHg LVOT Vmax:         68.60 cm/s LVOT Vmean:        45.100 cm/s LVOT VTI:          0.104 m LVOT/AV VTI ratio: 0.58  AORTA Ao Root diam: 3.70 cm Ao Asc diam:  3.40 cm MITRAL VALVE MV Area (PHT): 2.09 cm    SHUNTS MV Decel Time: 363 msec    Systemic VTI:  0.10 m MV E velocity: 50.00 cm/s  Systemic Diam: 2.20 cm MV A  velocity: 78.20 cm/s MV E/A ratio:  0.64 Rudean Haskell MD Electronically signed by Rudean Haskell MD Signature Date/Time: 12/24/2020/5:25:24 PM    Final (Updated)    DG FL GUIDED LUMBAR PUNCTURE  Result Date: 12/24/2020 CLINICAL DATA:  Concern for meningitis either infectious or carcinomatosis in a 80 year old male. EXAM: DIAGNOSTIC LUMBAR PUNCTURE UNDER FLUOROSCOPIC GUIDANCE COMPARISON:  None FLUOROSCOPY TIME:  Fluoroscopy Time:  20 seconds Radiation Exposure Index (if provided by the fluoroscopic device): 4.5 mGy Number of Acquired Spot Images: 0 PROCEDURE: Informed consent was obtained from patient's family prior to proceeding. Following time-out in sterile prep and drape and numbing with 1% lidocaine over the superficial soft tissues, the L3-L4 interspace was accessed under fluoroscopic guidance. A 3-1/2 inch 20 gauge spinal needle was utilized yielding clear CSF. Opening pressure was grossly normal but due to Valsalva and prone positioning exact measurement was not obtained. CSF flow was best with the head of bed slightly elevated. 11 cc of clear spinal fluid was collected in 4 separate aliquots. Sample was sent for requested testing. IMPRESSION: Technically successful lumbar puncture at the L3-L4 level without signs of complication. CSF sent for requested testing. Electronically Signed   By: Zetta Bills M.D.   On: 12/24/2020  18:07     PHYSICAL EXAM  Temp:  [98.2 F (36.8 C)-98.9 F (37.2 C)] 98.7 F (37.1 C) (09/18 1146) Pulse Rate:  [81-99] 84 (09/18 1205) Resp:  [16-23] 19 (09/18 1205) BP: (134-156)/(87-108) 136/87 (09/18 1146) SpO2:  [90 %-94 %] 94 % (09/18 1205)  General - Well nourished, well developed, unresponsive and not arousable.  Ophthalmologic - fundi not visualized due to noncooperation.  Cardiovascular - Regular rhythm and rate.  Neuro - eyes closed, not arousable to voice or pain stimulation.  Not follow commands not able to answer questions.  With forced eye opening, both eyes midline position, not blinking to visual threat, not tracking, PERRL.  No asymmetry of nasolabial fold, on pain stimulation, no movement of all extremities, however grimace on the left face. Sensation, coordination and gait not tested.   ASSESSMENT/PLAN [name]. is a 80 y.o. 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   Seizure EEG showed focal seizure left temporal lobe Long-term EEG showed frequent focal seizures -> improved with AEDs LTM EEG 9/17 and 9/18 no seizure Was on keppra, depakote, vimpat and onfi Decreased level of consciousness since 9/15, keppra dose decreased to 500 mg twice daily, Depakote 500 twice daily and 250 in p.m.  Vimpat 100 mg twice daily. Onfi and trazodone discontinued due to sedation effect CSF WBC 3, protein 27, glucose 81, no evidence of CNS infection or carcinomatosis  Metabolic encephalopathy Initially fluctuating mental status, now obtunded with difficulty arousal Likely multifactorial including medication effect, hypernatremia, hyperammonemia, AKI,  On FW Management per primary team  Artifact of CT at right brachium pontis  9/13 CT head no acute abnormality 9/16 CT right brachium pontis ?  subacute infarct CTA head and neck unremarkable MRI not able to performed due to pacemaker Repeat CT 9/18 no acute abnormalities, previous seen right brachium pontis hypodense likely artifact. 2D Echo EF 55 to 60% Pacemaker interrogation no A. fib LDL 42 HgbA1c 5.8 Lovenox for VTE prophylaxis ASA 81 prior to admission, continue ASA 81.  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 Hortense Ramal will continue to follow in am  Rosalin Hawking, MD PhD Stroke Neurology 12/26/2020 3:22 PM    To contact Stroke Continuity provider, please refer to [email or web address] After hours, contact General Neurology

## 2020-12-26 NOTE — Progress Notes (Signed)
LTM maintenance  Event button tested.  No skin breakdown noted at A1 A2 F7 F8

## 2020-12-26 NOTE — Progress Notes (Signed)
PROGRESS NOTE        PATIENT DETAILS Name: David [name]. Age: 80 y.o. Sex: male Date of Birth: October 26, 1940 Admit Date: 12/30/2020 Admitting Physician No admitting provider for patient encounter. IEP:PIRJJOACZ, [name], MD  Brief Narrative: Patient is a 80 y.o. male with history of metastatic melanoma with bone/lung/brain mets-s/p brain radiation/left frontal craniotomy and tumor excision (July 2017), acute promyelocytic leukemia, hemorrhagic CVA, HTN, CAD s/p CABG 2014, AV block-s/p PPM placement, OSA on CPAP, depression who presented to [facility] with aphasia-found to have EEG evidence of seizures originating from the left temporal region-subsequently transferred to [facility] for LTM EEG.  Patient was felt to have intractable seizures-and was started on multiple antiepileptics (Onfi, Depakote, Keppra, Vimpat) subsequent hospital course complicated by encephalopathy (felt to be due to Onfi/seizure meds), fever and acute CVA. See below for further details.  Subjective: Still very lethargic-coughing-trying to clear his throat.  Grimaces to painful response.  Objective: Vitals: Blood pressure 135/90, pulse 97, temperature 98.2 F (36.8 C), temperature source Oral, resp. rate 18, SpO2 93 %.   Exam: Gen Exam: Sleepy/drowsy-grimaces to painful response. HEENT:atraumatic, normocephalic Chest: Transmitted upper airway sounds. CVS:S1S2 regular Abdomen:soft non tender, non distended Extremities: Trace-edema Neurology: Unable to assess due to severe encephalopathy. Skin: no rash    Pertinent labs/radiology: NA: 151 Creatinine: 2.72 WBC: 8.1 Plt:142  CSF: WBC 3, protein 27  Stroke work-up: 9/16>> CT head: Right cerebellar CVA 9/16>> CTA head and neck: No LVO 9/16>> Echo: EF 55-60% 9/16>> LDL: 42 9/16>> A1c: 5.8  Microbiology: 9/14>>Blood culture: 1/2-staff epidermidis 9/16>> blood culture: No growth 9/16>> CSF culture: No growth  Pathology: 9/16>>CSF  cytology:pending  LTM EEG overnight: No seizures  Assessment/Plan: New refractory focal seizures: No further seizures on LTM EEG (last EEG seizure on 9/15) -remains severely encephalopathic.  Neurology following.  No longer on Onfi.  Remains on Vimpat/Keppra/Depakote.  Will await further input from neurology.  Acute toxic encephalopathy: Thought to be due to to seizure medications-particularly Onfi which has a long half-life.  However-developing worsening AKI-which may be playing a role in encephalopathy (BUN now 63).  Still very encephalopathic-only grimaces to pain.  Extensive work-up completed-no CT evidence of basilar artery thrombosis, CSF studies not consistent leptomeningeal meningitis-although cytology is pending.  Hopefully once renal function starts to improve and benzodiazepines were stopped from the system-his mentation will improve.  Neurology following as well.  Acute right cerebellar CVA: Suspected embolic etiology-PPM interrogated on 9/17-no A. fib.  Telemetry monitor shows paced rhythm.  CTA head/neck with no evidence of basilar artery thrombosis.  Now on aspirin/Plavix-await further input from neurology.  Fever on 9/13-?  Aspiration PNA: No further fever since 9/13-high suspicion for aspiration pneumonia given sedation/lethargy and fever.  No other sources of infection apparent.  Has completed a 5-day course of empiric antimicrobial therapy.  Coag negative staph bacteremia: 1/2 blood culture on 9/14 positive for staph epidermidis-repeat cultures on 9/16 (before vancomycin started) negative so far.  Although high suspicion for contamination-patient has PPM in place.  Since repeat cultures are negative-no longer on vancomycin.  AKI on CKD stage IIIb: AKI likely due to contrast nephropathy-received IV contrast for CT angio head/neck on 9/16 (risk of contrast-induced nephropathy was discussed with daughter).  Creatinine continues to uptrend-other electrolytes stable-good urine output  overnight-no indications for RRT.  Hopeful that renal function will plateau soon.  Avoid  nephrotoxic agents-watch closely.    Hypernatremia: Secondary to dehydration (no oral intake due to encephalopathy)-increase free water via NG tube to 400 cc every 4 hours.  Okay to stop IV fluids today.  Hyperglycemia: Likely due to tube feedings-no evidence of diabetes.  CBGs better-continue Levemir 10 units twice daily and SSI.    Recent Labs    12/26/20 0402 12/26/20 0721 12/26/20 1112  GLUCAP 219* 170* 169*      HTN: BP on the higher side-has CVA-hence will allow some amount of permissive hypertension-continue metoprolol cautiously.  Amlodipine remains on hold.   History of CAD s/p CABG in 2014: No anginal symptoms-continue supportive care.  On aspirin/Plavix and beta-blocker.  History of complete heart block-PPM in place  History of hemiplegia and hemiparesis following nontraumatic intracranial hemorrhage affecting right dominant side  History of metastatic melanoma with metastasis to the lingula, right upper lung, supraventricular clavicular lymph node, left frontal lobe metastasis-s/p resection: Resume outpatient follow-up with oncology/neurooncology.    History of acute promyelocytic leukemia: Follow CBC periodically--in remission-stable for outpatient follow-up with primary oncologist.  Major depressive disorder: Trazodone held due to encephalopathy-hold Zoloft for now as well.  Hypothyroidism: Continue levothyroxine.  Recent TSH in May was within normal limits.   OSA:on CPAP-on hold as NG tube is in place.  Nutrition: Cortak tube placed on 9/16-feedings ongoing.  Will need SLP reevaluation once encephalopathy has improved before discontinuing cortak.  Functional quadriplegia/severe debility/deconditioning: Normally able to ambulate with the help of walker-current significant debility is from acute illness.  Once encephalopathy resolves-hopeful that we can start  PT/OT.  Obesity: Estimated body mass index is 30.41 kg/m as calculated from the following:   Height as of 12/15/20: 5\' 8"  (1.727 m).   Weight as of 12/15/20: 90.7 kg.    Procedures : 9/16>> lumbar puncture by IR Consults:Neurology DVT Prophylaxis :Prophylactic Lovenox Code Status:DNR Family Communication:Daughter-[name] [phone] on 9/18  Disposition Plan: Status is: Inpatient  Remains inpatient appropriate because:Inpatient level of care appropriate due to severity of illness  Dispo: The patient is from: Home              Anticipated d/c is to: SNF              Patient currently is not medically stable to d/c.   Difficult to place patient No    Barriers to Discharge: Ongoing encephalopathy-intractable seizures-LTM EEG in progress-not stable for discharge.  Antimicrobial agents: Anti-infectives (From admission, onward)    Start     Dose/Rate Route Frequency Ordered Stop   12/26/20 1600  vancomycin (VANCOREADY) IVPB 1250 mg/250 mL  Status:  Discontinued        1,250 mg 166.7 mL/hr over 90 Minutes Intravenous Every 48 hours 12/24/20 1252 12/25/20 1038   12/26/20 1600  vancomycin (VANCOCIN) IVPB 1000 mg/200 mL premix  Status:  Discontinued        1,000 mg 200 mL/hr over 60 Minutes Intravenous Every 48 hours 12/25/20 1038 12/25/20 1547   12/24/20 1800  Ampicillin-Sulbactam (UNASYN) 3 g in sodium chloride 0.9 % 100 mL IVPB        3 g 200 mL/hr over 30 Minutes Intravenous Every 12 hours 12/24/20 1309 12/25/20 1754   12/24/20 1345  vancomycin (VANCOREADY) IVPB 1750 mg/350 mL        1,750 mg 175 mL/hr over 120 Minutes Intravenous  Once 12/24/20 1251 12/24/20 1548   12/22/20 1300  ampicillin-sulbactam (UNASYN) 1.5 g in sodium chloride 0.9 % 100 mL IVPB  Status:  Discontinued        1.5 g 200 mL/hr over 30 Minutes Intravenous Every 8 hours 12/22/20 1202 12/24/20 1309   12/21/20 1900  azithromycin (ZITHROMAX) 500 mg in sodium chloride 0.9 % 250 mL IVPB  Status:   Discontinued        500 mg 250 mL/hr over 60 Minutes Intravenous Every 24 hours 12/21/20 1805 12/22/20 1058   12/21/20 1730  cefTRIAXone (ROCEPHIN) 1 g in sodium chloride 0.9 % 100 mL IVPB  Status:  Discontinued        1 g 200 mL/hr over 30 Minutes Intravenous Every 24 hours 12/21/20 1633 12/22/20 1058   12/21/20 1730  azithromycin (ZITHROMAX) tablet 500 mg  Status:  Discontinued        500 mg Oral Daily 12/21/20 1633 12/21/20 1804        Time spent: 35 minutes-Greater than 50% of this time was spent in counseling, explanation of diagnosis, planning of further management, and coordination of care.  MEDICATIONS: Scheduled Meds:  allopurinol  200 mg Per Tube Daily   aspirin  81 mg Per Tube Daily   clopidogrel  75 mg Oral Daily   enoxaparin (LOVENOX) injection  30 mg Subcutaneous Q24H   feeding supplement (PROSource TF)  45 mL Per Tube BID   free water  400 mL Per Tube Q4H   insulin aspart  0-15 Units Subcutaneous Q4H   insulin aspart  0-5 Units Subcutaneous QHS   insulin detemir  10 Units Subcutaneous BID   levothyroxine  137 mcg Per Tube Q0600   lidocaine (PF)  5 mL Other Once   metoprolol tartrate  37.5 mg Per Tube BID   sertraline  50 mg Per Tube Daily   valproic acid  500 mg Per Tube BID   And   valproic acid  250 mg Per Tube Q24H   Continuous Infusions:  sodium chloride 50 mL/hr at 12/26/20 0507   feeding supplement (OSMOLITE 1.5 CAL) 60 mL/hr at 12/26/20 0507   lacosamide (VIMPAT) IV 150 mg (12/26/20 1043)   levETIRAcetam Stopped (12/25/20 2233)   PRN Meds:.acetaminophen **OR** acetaminophen, albuterol, colchicine, meclizine, midazolam, ondansetron **OR** ondansetron (ZOFRAN) IV   LABORATORY DATA: CBC: Recent Labs  Lab 12/22/20 0404 12/23/20 0302 12/24/20 0255 12/24/20 0909 12/25/20 0605 12/26/20 0740  WBC 4.5 5.6 6.4  --  8.4 8.1  HGB 17.6* 16.6 16.8 16.3 16.6 15.5  HCT 54.0* 50.6 52.4* 48.0 51.0 47.7  MCV 96.1 96.4 96.1  --  97.0 97.3  PLT PLATELET  CLUMPS NOTED ON SMEAR, UNABLE TO ESTIMATE 127* 159  --  150 142*     Basic Metabolic Panel: Recent Labs  Lab 12/20/20 0316 12/21/20 0643 12/22/20 0404 12/23/20 0302 12/24/20 0255 12/24/20 0909 12/24/20 1650 12/25/20 0605 12/25/20 1631 12/26/20 0551 12/26/20 0740  NA 139   < > 143 144 147* 151*  --  150*  --   --  151*  K 5.1   < > 5.3* 4.9 4.3 4.1  --  4.3  --   --  4.2  CL 107   < > 103 112* 115*  --   --  118*  --   --  123*  CO2 22   < > 23 21* 20*  --   --  17*  --   --  19*  GLUCOSE 90   < > 116* 129* 174*  --   --  218*  --   --  194*  BUN  46*   < > 47* 44* 44*  --   --  54*  --   --  63*  CREATININE 1.97*   < > 2.02* 2.12* 2.14*  --   --  2.30*  --   --  2.72*  CALCIUM 9.2   < > 9.3 8.8* 9.1  --   --  8.5*  --   --  8.1*  MG 2.5*  --   --   --   --   --  2.2 2.4 2.7* 2.8*  --   PHOS  --   --   --   --   --   --  3.5 2.6 3.2 3.5  --    < > = values in this interval not displayed.     GFR: Estimated Creatinine Clearance: 23.3 mL/min (A) (by C-G formula based on SCr of 2.72 mg/dL (H)).  Liver Function Tests: Recent Labs  Lab 12/23/20 0302 12/24/20 0255 12/26/20 0740  AST 41 27 18  ALT 35 26 15  ALKPHOS 44 45 41  BILITOT 0.8 0.6 0.5  PROT 6.0* 6.2* 5.3*  ALBUMIN 3.0* 2.9* 2.2*    No results for input(s): LIPASE, AMYLASE in the last 168 hours. Recent Labs  Lab 12/21/20 0914 12/23/20 0917 12/25/20 0605 12/26/20 0740  AMMONIA 42* 32 52* 42*     Coagulation Profile: No results for input(s): INR, PROTIME in the last 168 hours.   Cardiac Enzymes: No results for input(s): CKTOTAL, CKMB, CKMBINDEX, TROPONINI in the last 168 hours.  BNP (last 3 results) No results for input(s): PROBNP in the last 8760 hours.  Lipid Profile: Recent Labs    12/24/20 1903  CHOL 108  HDL 34*  LDLCALC 42  TRIG 161*  CHOLHDL 3.2     Thyroid Function Tests: No results for input(s): TSH, T4TOTAL, FREET4, T3FREE, THYROIDAB in the last 72 hours.  Anemia Panel: No  results for input(s): VITAMINB12, FOLATE, FERRITIN, TIBC, IRON, RETICCTPCT in the last 72 hours.  Urine analysis:    Component Value Date/Time   COLORURINE YELLOW 12/22/2020 0340   APPEARANCEUR CLEAR 12/22/2020 0340   LABSPEC 1.020 12/22/2020 0340   PHURINE 6.0 12/22/2020 0340   GLUCOSEU NEGATIVE 12/22/2020 0340   GLUCOSEU NEGATIVE 10/12/2020 1118   HGBUR LARGE (A) 12/22/2020 0340   BILIRUBINUR NEGATIVE 12/22/2020 0340   KETONESUR NEGATIVE 12/22/2020 0340   PROTEINUR 100 (A) 12/22/2020 0340   UROBILINOGEN 0.2 10/12/2020 1118   NITRITE NEGATIVE 12/22/2020 0340   LEUKOCYTESUR NEGATIVE 12/22/2020 0340    Sepsis Labs: Lactic Acid, Venous    Component Value Date/Time   LATICACIDVEN 0.8 01/03/2019 0046    MICROBIOLOGY: Recent Results (from the past 240 hour(s))  Resp Panel by RT-PCR (Flu A&B, Covid) Nasopharyngeal Swab     Status: None   Collection Time: 12/21/20  4:34 PM   Specimen: Nasopharyngeal Swab; Nasopharyngeal(NP) swabs in vial transport medium  Result Value Ref Range Status   SARS Coronavirus 2 by RT PCR NEGATIVE NEGATIVE Final    Comment: (NOTE) SARS-CoV-2 target nucleic acids are NOT DETECTED.  The SARS-CoV-2 RNA is generally detectable in upper respiratory specimens during the acute phase of infection. The lowest concentration of SARS-CoV-2 viral copies this assay can detect is 138 copies/mL. A negative result does not preclude SARS-Cov-2 infection and should not be used as the sole basis for treatment or other patient management decisions. A negative result may occur with  improper specimen collection/handling, submission of specimen other than nasopharyngeal swab,  presence of viral mutation(s) within the areas targeted by this assay, and inadequate number of viral copies(<138 copies/mL). A negative result must be combined with clinical observations, patient history, and epidemiological information. The expected result is Negative.  Fact Sheet for Patients:   EntrepreneurPulse.com.au  Fact Sheet for Healthcare Providers:  IncredibleEmployment.be  This test is no t yet approved or cleared by the [place] FDA and  has been authorized for detection and/or diagnosis of SARS-CoV-2 by FDA under an Emergency Use Authorization (EUA). This EUA will remain  in effect (meaning this test can be used) for the duration of the COVID-19 declaration under Section 564(b)(1) of the Act, 21 U.S.C.section 360bbb-3(b)(1), unless the authorization is terminated  or revoked sooner.       Influenza A by PCR NEGATIVE NEGATIVE Final   Influenza B by PCR NEGATIVE NEGATIVE Final    Comment: (NOTE) The Xpert Xpress SARS-CoV-2/FLU/RSV plus assay is intended as an aid in the diagnosis of influenza from Nasopharyngeal swab specimens and should not be used as a sole basis for treatment. Nasal washings and aspirates are unacceptable for Xpert Xpress SARS-CoV-2/FLU/RSV testing.  Fact Sheet for Patients: EntrepreneurPulse.com.au  Fact Sheet for Healthcare Providers: IncredibleEmployment.be  This test is not yet approved or cleared by the [place] FDA and has been authorized for detection and/or diagnosis of SARS-CoV-2 by FDA under an Emergency Use Authorization (EUA). This EUA will remain in effect (meaning this test can be used) for the duration of the COVID-19 declaration under Section 564(b)(1) of the Act, 21 U.S.C. section 360bbb-3(b)(1), unless the authorization is terminated or revoked.  Performed at [facility], [place] [address] [place], [place] [address]   MRSA Next Gen by PCR, Nasal     Status: Abnormal   Collection Time: 12/21/20  4:36 PM   Specimen: Nasopharyngeal Swab; Nasal Swab  Result Value Ref Range Status   MRSA by PCR Next Gen NEGATIVE (A) NOT DETECTED Final    Comment:        The GeneXpert MRSA Assay (FDA approved for NASAL specimens only), is one  component of a comprehensive MRSA colonization surveillance program. It is not intended to diagnose MRSA infection nor to guide or monitor treatment for MRSA infections. Performed at [facility], [place] [address], [place], [place] [address]   Culture, blood (routine x 2)     Status: Abnormal   Collection Time: 12/21/20  5:13 PM   Specimen: BLOOD RIGHT HAND  Result Value Ref Range Status   Specimen Description BLOOD RIGHT HAND  Final   Special Requests   Final    BOTTLES DRAWN AEROBIC ONLY Blood Culture results may not be optimal due to an inadequate volume of blood received in culture bottles   Culture  Setup Time   Final    GRAM POSITIVE COCCI IN CLUSTERS AEROBIC BOTTLE ONLY CRITICAL RESULT CALLED TO, READ BACK BY AND VERIFIED WITH: PHARMD ALEX L [id number] [id number] FCP    Culture (A)  Final    STAPHYLOCOCCUS EPIDERMIDIS THE SIGNIFICANCE OF ISOLATING THIS ORGANISM FROM A SINGLE SET OF BLOOD CULTURES WHEN MULTIPLE SETS ARE DRAWN IS UNCERTAIN. PLEASE NOTIFY THE MICROBIOLOGY DEPARTMENT WITHIN ONE WEEK IF SPECIATION AND SENSITIVITIES ARE REQUIRED. Performed at [facility], [place] [address] [place],  [address]    Report Status 12/24/2020 FINAL  Final  Blood Culture ID Panel (Reflexed)     Status: Abnormal   Collection Time: 12/21/20  5:13 PM  Result Value Ref  Range Status   Enterococcus faecalis NOT DETECTED NOT DETECTED Final   Enterococcus Faecium NOT DETECTED NOT DETECTED Final   Listeria monocytogenes NOT DETECTED NOT DETECTED Final   Staphylococcus species DETECTED (A) NOT DETECTED Final    Comment: CRITICAL RESULT CALLED TO, READ BACK BY AND VERIFIED WITH: PHARMD ALEX L 1305 [id number] FCP    Staphylococcus aureus (BCID) NOT DETECTED NOT DETECTED Final   Staphylococcus epidermidis DETECTED (A) NOT DETECTED Final    Comment: Methicillin (oxacillin) resistant coagulase negative staphylococcus. Possible blood culture contaminant (unless isolated from more than one blood  culture draw or clinical case suggests pathogenicity). No antibiotic treatment is indicated for blood  culture contaminants. CRITICAL RESULT CALLED TO, READ BACK BY AND VERIFIED WITH: PHARMD ALEX L 1305 [id number] FCP    Staphylococcus lugdunensis NOT DETECTED NOT DETECTED Final   Streptococcus species NOT DETECTED NOT DETECTED Final   Streptococcus agalactiae NOT DETECTED NOT DETECTED Final   Streptococcus pneumoniae NOT DETECTED NOT DETECTED Final   Streptococcus pyogenes NOT DETECTED NOT DETECTED Final   A.calcoaceticus-baumannii NOT DETECTED NOT DETECTED Final   Bacteroides fragilis NOT DETECTED NOT DETECTED Final   Enterobacterales NOT DETECTED NOT DETECTED Final   Enterobacter cloacae complex NOT DETECTED NOT DETECTED Final   Escherichia coli NOT DETECTED NOT DETECTED Final   Klebsiella aerogenes NOT DETECTED NOT DETECTED Final   Klebsiella oxytoca NOT DETECTED NOT DETECTED Final   Klebsiella pneumoniae NOT DETECTED NOT DETECTED Final   Proteus species NOT DETECTED NOT DETECTED Final   Salmonella species NOT DETECTED NOT DETECTED Final   Serratia marcescens NOT DETECTED NOT DETECTED Final   Haemophilus influenzae NOT DETECTED NOT DETECTED Final   Neisseria meningitidis NOT DETECTED NOT DETECTED Final   Pseudomonas aeruginosa NOT DETECTED NOT DETECTED Final   Stenotrophomonas maltophilia NOT DETECTED NOT DETECTED Final   Candida albicans NOT DETECTED NOT DETECTED Final   Candida auris NOT DETECTED NOT DETECTED Final   Candida glabrata NOT DETECTED NOT DETECTED Final   Candida krusei NOT DETECTED NOT DETECTED Final   Candida parapsilosis NOT DETECTED NOT DETECTED Final   Candida tropicalis NOT DETECTED NOT DETECTED Final   Cryptococcus neoformans/gattii NOT DETECTED NOT DETECTED Final   Methicillin resistance mecA/C DETECTED (A) NOT DETECTED Final    Comment: CRITICAL RESULT CALLED TO, READ BACK BY AND VERIFIED WITH: PHARMD ALEX L [id number] FCP Performed at [facility], [address] [place], [place] [address]   Culture, blood (routine x 2)     Status: None   Collection Time: 12/21/20  5:45 PM   Specimen: BLOOD RIGHT ARM  Result Value Ref Range Status   Specimen Description BLOOD RIGHT ARM  Final   Special Requests   Final    BOTTLES DRAWN AEROBIC ONLY Blood Culture adequate volume   Culture   Final    NO GROWTH 5 DAYS Performed at [facility], [place] [address], [place], [place] [address]    Report Status 12/26/2020 FINAL  Final  Urine Culture     Status: Abnormal   Collection Time: 12/22/20  3:40 AM   Specimen: Urine, Clean Catch  Result Value Ref Range Status   Specimen Description URINE, CLEAN CATCH  Final   Special Requests NONE  Final   Culture (A)  Final    <10,000 COLONIES/mL INSIGNIFICANT GROWTH Performed at [facility], [place] [address], [place], [place] [address]    Report Status 12/23/2020 FINAL  Final  Culture, blood (routine x  2)     Status: None (Preliminary result)   Collection Time: 12/24/20 11:39 AM   Specimen: BLOOD LEFT HAND  Result Value Ref Range Status   Specimen Description BLOOD LEFT HAND  Final   Special Requests BOTTLES DRAWN AEROBIC ONLY  Final   Culture   Final    NO GROWTH 2 DAYS Performed at [facility], [address], [place], [place] [address]    Report Status PENDING  Incomplete  Culture, blood (routine x 2)     Status: None (Preliminary result)   Collection Time: 12/24/20 11:49 AM   Specimen: BLOOD RIGHT HAND  Result Value Ref Range Status   Specimen Description BLOOD RIGHT HAND  Final   Special Requests BOTTLES DRAWN AEROBIC ONLY  Final   Culture   Final    NO GROWTH 2 DAYS Performed at [facility], [place] [address] [place], [place] [address]    Report Status PENDING  Incomplete  CSF culture w Gram Stain     Status: None (Preliminary result)   Collection Time: 12/24/20  6:19 PM   Specimen: CSF; Cerebrospinal Fluid  Result Value Ref Range Status   Specimen Description CSF   Final   Special Requests Normal  Final   Gram Stain NO ORGANISMS SEEN NO WBC SEEN CYTOSPIN SMEAR   Final   Culture   Final    NO GROWTH 2 DAYS Performed at [facility], [place] [address], [place], [place] [address]    Report Status PENDING  Incomplete    RADIOLOGY STUDIES/RESULTS: CT ANGIO HEAD W OR WO CONTRAST  Result Date: 12/24/2020 CLINICAL DATA:  Neuro deficit, acute, stroke suspected; Stroke/TIA, assess intracranial arteries EXAM: CT ANGIOGRAPHY HEAD AND NECK TECHNIQUE: Multidetector CT imaging of the head and neck was performed using the standard protocol during bolus administration of intravenous contrast. Multiplanar CT image reconstructions and MIPs were obtained to evaluate the vascular anatomy. Carotid stenosis measurements (when applicable) are obtained utilizing NASCET criteria, using the distal internal carotid diameter as the denominator. CONTRAST:  55mL OMNIPAQUE IOHEXOL 350 MG/ML SOLN COMPARISON:  CTA Aug 23, 2015.  Same day CT head. FINDINGS: CTA NECK FINDINGS Aortic arch: Atherosclerosis of the carotid bifurcation and great vessel origins. Great vessel origins are patent. Right carotid system: Mild mixed calcific and noncalcific atherosclerosis without greater than 50% stenosis. Tortuous ICA. Left carotid system: Moderate mixed calcific and noncalcific atherosclerosis at the carotid bifurcation without greater than 50% stenosis. Vertebral arteries: Left dominant. The right vertebral artery is small throughout its course. Mild multifocal narrowing of the left vertebral artery due to mass effect from adjacent osteophytes. No greater than 50% stenosis. Skeleton: Severe degenerative disease in the lower cervical spine. Other neck: No acute abnormality. Upper chest: Visualized lung apices are clear. Review of the MIP images confirms the above findings CTA HEAD FINDINGS Anterior circulation: Bilateral intracranial ICAs, MCAs, and ACAs are patent without proximal hemodynamically  significant stenosis. No aneurysm identified. Posterior circulation: Moderate stenosis of the non dominant right vertebral artery, similar to 2017.Bilateral PICAs are patent. Left vertebral artery is dominant and patent without significant stenosis. The basilar artery and both posterior cerebral arteries are patent without proximal hemodynamically significant stenosis. No aneurysm identified. Venous sinuses: As permitted by contrast timing, patent. Anatomic variants: None. Review of the MIP images confirms the above findings IMPRESSION: CTA head: 1. No large vessel occlusion. 2. Moderate stenosis of the right intradural vertebral artery, similar to 2017. CTA neck : No significant (  greater than 50%) stenosis. Electronically Signed   By: Margaretha Sheffield M.D.   On: 12/24/2020 15:20   DG Abd 1 View  Result Date: 12/24/2020 CLINICAL DATA:  Feeding tube placement EXAM: ABDOMEN - 1 VIEW COMPARISON:  CT abdomen 01/05/2019 FINDINGS: Pacer lead noted.  Lower sternotomy wire loaded. The feeding tube projects in the vicinity of the pylorus and could be in the distal stomach antrum or duodenal bulb. No dilated bowel identified.  Lumbar spondylosis noted. IMPRESSION: 1. Feeding tube tip is periportal or can could be in the distal stomach antrum or duodenal bulb. 2. Pacer device noted. Electronically Signed   By: Van Clines M.D.   On: 12/24/2020 18:34   CT HEAD WO CONTRAST (5MM)  Result Date: 12/24/2020 CLINICAL DATA:  Seizure, abnormal neuro exam EXAM: CT HEAD WITHOUT CONTRAST TECHNIQUE: Contiguous axial images were obtained from the base of the skull through the vertex without intravenous contrast. COMPARISON:  12/21/2020 FINDINGS: Brain: No acute intracranial hemorrhage. There is a new area of hypoattenuation involving the right brachium pontis and adjacent cerebellum (series 3, image 10). Left frontal encephalomalacia underlying craniotomy with ex vacuo dilatation of the left lateral ventricle. Ventricles are  stable in size. Stable findings of chronic microvascular ischemic changes in the cerebral white matter. Vascular: No hyperdense vessel. Skull: Left craniotomy. Sinuses/Orbits: No acute finding. Other: None. IMPRESSION: New low-density involving right brachium pontis and adjacent cerebellum suspicious for acute or subacute infarction. No acute intracranial hemorrhage. Otherwise stable findings detailed above. These results will be called to the ordering clinician or representative by the Radiologist Assistant, and communication documented in the PACS or [employer]. Electronically Signed   By: Macy Mis M.D.   On: 12/24/2020 13:14   CT ANGIO NECK W OR WO CONTRAST  Result Date: 12/24/2020 CLINICAL DATA:  Neuro deficit, acute, stroke suspected; Stroke/TIA, assess intracranial arteries EXAM: CT ANGIOGRAPHY HEAD AND NECK TECHNIQUE: Multidetector CT imaging of the head and neck was performed using the standard protocol during bolus administration of intravenous contrast. Multiplanar CT image reconstructions and MIPs were obtained to evaluate the vascular anatomy. Carotid stenosis measurements (when applicable) are obtained utilizing NASCET criteria, using the distal internal carotid diameter as the denominator. CONTRAST:  74mL OMNIPAQUE IOHEXOL 350 MG/ML SOLN COMPARISON:  CTA Aug 23, 2015.  Same day CT head. FINDINGS: CTA NECK FINDINGS Aortic arch: Atherosclerosis of the carotid bifurcation and great vessel origins. Great vessel origins are patent. Right carotid system: Mild mixed calcific and noncalcific atherosclerosis without greater than 50% stenosis. Tortuous ICA. Left carotid system: Moderate mixed calcific and noncalcific atherosclerosis at the carotid bifurcation without greater than 50% stenosis. Vertebral arteries: Left dominant. The right vertebral artery is small throughout its course. Mild multifocal narrowing of the left vertebral artery due to mass effect from adjacent osteophytes. No greater  than 50% stenosis. Skeleton: Severe degenerative disease in the lower cervical spine. Other neck: No acute abnormality. Upper chest: Visualized lung apices are clear. Review of the MIP images confirms the above findings CTA HEAD FINDINGS Anterior circulation: Bilateral intracranial ICAs, MCAs, and ACAs are patent without proximal hemodynamically significant stenosis. No aneurysm identified. Posterior circulation: Moderate stenosis of the non dominant right vertebral artery, similar to 2017.Bilateral PICAs are patent. Left vertebral artery is dominant and patent without significant stenosis. The basilar artery and both posterior cerebral arteries are patent without proximal hemodynamically significant stenosis. No aneurysm identified. Venous sinuses: As permitted by contrast timing, patent. Anatomic variants: None. Review of the MIP images confirms  the above findings IMPRESSION: CTA head: 1. No large vessel occlusion. 2. Moderate stenosis of the right intradural vertebral artery, similar to 2017. CTA neck : No significant (greater than 50%) stenosis. Electronically Signed   By: Margaretha Sheffield M.D.   On: 12/24/2020 15:20   DG Chest Port 1 View  Result Date: 12/25/2020 CLINICAL DATA:  Evaluate for consolidation.  Shortness of breath. EXAM: PORTABLE CHEST 1 VIEW COMPARISON:  None. FINDINGS: A feeding tube terminates below today's film. A 3 lead pacemaker is identified. There is a tortuous thoracic aorta with atherosclerotic change. The heart, hila, and mediastinum are otherwise unremarkable. No pneumothorax. The lungs are clear. IMPRESSION: No acute abnormalities are identified. Electronically Signed   By: Dorise Bullion III M.D.   On: 12/25/2020 08:58   ECHOCARDIOGRAM COMPLETE  Result Date: 12/24/2020    ECHOCARDIOGRAM REPORT   Patient Name:   Rangel Echeverri. Date of Exam: 12/24/2020 Medical Rec #:  [id number]              Height:       68.0 in Accession #:    [id number]             Weight:       200.0  lb Date of Birth:  Nov 07, 1940              BSA:          2.044 m Patient Age:    76 years               BP:           142/100 mmHg Patient Gender: M                      HR:           88 bpm. Exam Location:  Inpatient Procedure: 2D Echo, Cardiac Doppler, Color Doppler and Intracardiac            Opacification Agent                                MODIFIED REPORT:     This report was modified by Rudean Haskell MD on 12/24/2020 due to                                    Spelling.  Indications:     Stroke  History:         Patient has prior history of Echocardiogram examinations, most                  recent 08/27/2019. Defibrillator; Risk Factors:Hypertension and                  Sleep Apnea. H/O stroke. CKD.  Sonographer:     Clayton Lefort RDCS (AE) Referring Phys:  [id number] Lora Havens Diagnosing Phys: Rudean Haskell MD  Sonographer Comments: Technically challenging study due to limited acoustic windows, Technically difficult study due to poor echo windows, suboptimal parasternal window, suboptimal apical window and suboptimal subcostal window. IMPRESSIONS  1. Left ventricular ejection fraction, by estimation, is 55 to 60%. The left ventricle has normal function. Left ventricular endocardial border not optimally defined to evaluate regional wall motion. There is mild concentric left ventricular hypertrophy. Left ventricular diastolic parameters are indeterminate.  2. Right ventricular systolic function is normal.  The right ventricular size is not well visualized. Tricuspid regurgitation signal is inadequate for assessing PA pressure.  3. The mitral valve is grossly normal. No evidence of mitral valve regurgitation.  4. The aortic valve was not well visualized. Aortic valve regurgitation is not visualized.  5. The inferior vena cava is normal in size with greater than 50% respiratory variability, suggesting right atrial pressure of 3 mmHg. Comparison(s): A prior study was performed on 08/27/2019. Difficult  comparison-these images are more technically difficult than prior. FINDINGS  Left Ventricle: Left ventricular ejection fraction, by estimation, is 55 to 60%. The left ventricle has normal function. Left ventricular endocardial border not optimally defined to evaluate regional wall motion. Definity contrast agent was given IV to delineate the left ventricular endocardial borders. The left ventricular internal cavity size was small. There is mild concentric left ventricular hypertrophy. Left ventricular diastolic parameters are indeterminate. Right Ventricle: The right ventricular size is not well visualized. Right vetricular wall thickness was not well visualized. Right ventricular systolic function is normal. Tricuspid regurgitation signal is inadequate for assessing PA pressure. Left Atrium: Left atrial size was normal in size. Right Atrium: Right atrial size was normal in size. Pericardium: There is no evidence of pericardial effusion. Presence of pericardial fat pad. Mitral Valve: The mitral valve is grossly normal. No evidence of mitral valve regurgitation. Tricuspid Valve: The tricuspid valve is normal in structure. Tricuspid valve regurgitation is not demonstrated. No evidence of tricuspid stenosis. Aortic Valve: The aortic valve was not well visualized. Aortic valve regurgitation is not visualized. Aortic valve mean gradient measures 4.0 mmHg. Aortic valve peak gradient measures 7.1 mmHg. Aortic valve area, by VTI measures 2.21 cm. Pulmonic Valve: The pulmonic valve was not well visualized. Pulmonic valve regurgitation is not visualized. Aorta: The aortic root is normal in size and structure and the ascending aorta was not well visualized. Venous: The inferior vena cava is normal in size with greater than 50% respiratory variability, suggesting right atrial pressure of 3 mmHg. IAS/Shunts: The interatrial septum was not well visualized. Additional Comments: A device lead is visualized.  LEFT VENTRICLE PLAX 2D  LVIDd:         3.60 cm  Diastology LVIDs:         2.30 cm  LV e' medial:    8.05 cm/s LV PW:         1.30 cm  LV E/e' medial:  6.2 LV IVS:        1.40 cm  LV e' lateral:   4.68 cm/s LVOT diam:     2.20 cm  LV E/e' lateral: 10.7 LV SV:         40 LV SV Index:   19 LVOT Area:     3.80 cm  IVC IVC diam: 1.20 cm LEFT ATRIUM           Index LA diam:      3.40 cm 1.66 cm/m LA Vol (A2C): 39.3 ml 19.23 ml/m LA Vol (A4C): 35.6 ml 17.42 ml/m  AORTIC VALVE AV Area (Vmax):    1.96 cm AV Area (Vmean):   1.88 cm AV Area (VTI):     2.21 cm AV Vmax:           133.00 cm/s AV Vmean:          91.400 cm/s AV VTI:            0.179 m AV Peak Grad:      7.1 mmHg AV Mean Grad:  4.0 mmHg LVOT Vmax:         68.60 cm/s LVOT Vmean:        45.100 cm/s LVOT VTI:          0.104 m LVOT/AV VTI ratio: 0.58  AORTA Ao Root diam: 3.70 cm Ao Asc diam:  3.40 cm MITRAL VALVE MV Area (PHT): 2.09 cm    SHUNTS MV Decel Time: 363 msec    Systemic VTI:  0.10 m MV E velocity: 50.00 cm/s  Systemic Diam: 2.20 cm MV A velocity: 78.20 cm/s MV E/A ratio:  0.64 Rudean Haskell MD Electronically signed by Rudean Haskell MD Signature Date/Time: 12/24/2020/5:25:24 PM    Final (Updated)    DG FL GUIDED LUMBAR PUNCTURE  Result Date: 12/24/2020 CLINICAL DATA:  Concern for meningitis either infectious or carcinomatosis in a 80 year old male. EXAM: DIAGNOSTIC LUMBAR PUNCTURE UNDER FLUOROSCOPIC GUIDANCE COMPARISON:  None FLUOROSCOPY TIME:  Fluoroscopy Time:  20 seconds Radiation Exposure Index (if provided by the fluoroscopic device): 4.5 mGy Number of Acquired Spot Images: 0 PROCEDURE: Informed consent was obtained from patient's family prior to proceeding. Following time-out in sterile prep and drape and numbing with 1% lidocaine over the superficial soft tissues, the L3-L4 interspace was accessed under fluoroscopic guidance. A 3-1/2 inch 20 gauge spinal needle was utilized yielding clear CSF. Opening pressure was grossly normal but due to Valsalva  and prone positioning exact measurement was not obtained. CSF flow was best with the head of bed slightly elevated. 11 cc of clear spinal fluid was collected in 4 separate aliquots. Sample was sent for requested testing. IMPRESSION: Technically successful lumbar puncture at the L3-L4 level without signs of complication. CSF sent for requested testing. Electronically Signed   By: Zetta Bills M.D.   On: 12/24/2020 18:07     LOS: 8 days   Oren Binet, MD  [facility]    To contact the attending provider between 7A-7P or the covering provider during after hours 7P-7A, please log into the web site [email or web address] and access using universal [place] password for that web site. If you do not have the password, please call the hospital operator.  12/26/2020, 11:29 AM

## 2020-12-26 NOTE — Progress Notes (Signed)
RT note. RT unable to place patient on cpap at this time due to cont. EEG, core track in place and patient unable to tolerate. CPAP is in patients room if needed. RT will continue to monitor.

## 2020-12-26 NOTE — Plan of Care (Signed)
  Problem: Clinical Measurements: Goal: Will remain free from infection Outcome: Progressing Goal: Diagnostic test results will improve Outcome: Progressing Goal: Respiratory complications will improve Outcome: Progressing Goal: Cardiovascular complication will be avoided Outcome: Progressing   

## 2020-12-26 NOTE — Progress Notes (Signed)
Difficult to obtain accurate NIHSS because of being obtunded and unable to participate in assessment.  Remains lethargic with minimal movement or response, same as noted by neurologist assessment.  Son was at his side with this nurse and did see him open his eyes when I raised the head of the bed and was in his face talking to him, as he has done on more than one occasion this evening.  Minimal movement or response otherwise.  No change in his assessment from the assessment of previous nurse and MD.

## 2020-12-26 NOTE — Progress Notes (Addendum)
Subjective: No clinical seizures overnight.  Afebrile.  However, still excessively lethargic, only moaning to noxious stimuli.  ROS: Unable to obtain due to poor mental status  Examination  Vital signs in last 24 hours: Temp:  [98.2 F (36.8 C)-98.9 F (37.2 C)] 98.2 F (36.8 C) (09/18 0733) Pulse Rate:  [88-109] 97 (09/18 0733) Resp:  [16-20] 18 (09/18 0733) BP: (134-162)/(90-115) 135/90 (09/18 0733) SpO2:  [90 %-94 %] 93 % (09/18 0733)  General: lying in bed, NAD CVS: pulse-normal rate and rhythm RS: Mouth open breathing comfortably, coarse breath sounds bilaterally Extremities: normal, warm    Neuro:  MS: Remains obtunded slight eye opening, slightly raises his left arm and moans with sternal rub.  CN: pupils equal and reactive, EOMI, face symmetric Motor: noxious stimulus to right thigh cause flicker if right toes only.   Basic Metabolic Panel: Recent Labs  Lab 12/20/20 0316 12/21/20 0643 12/22/20 0404 12/23/20 0302 12/24/20 0255 12/24/20 0909 12/24/20 1650 12/25/20 0605 12/25/20 1631 12/26/20 0551 12/26/20 0740  NA 139   < > 143 144 147* 151*  --  150*  --   --  151*  K 5.1   < > 5.3* 4.9 4.3 4.1  --  4.3  --   --  4.2  CL 107   < > 103 112* 115*  --   --  118*  --   --  123*  CO2 22   < > 23 21* 20*  --   --  17*  --   --  19*  GLUCOSE 90   < > 116* 129* 174*  --   --  218*  --   --  194*  BUN 46*   < > 47* 44* 44*  --   --  54*  --   --  63*  CREATININE 1.97*   < > 2.02* 2.12* 2.14*  --   --  2.30*  --   --  2.72*  CALCIUM 9.2   < > 9.3 8.8* 9.1  --   --  8.5*  --   --  8.1*  MG 2.5*  --   --   --   --   --  2.2 2.4 2.7* 2.8*  --   PHOS  --   --   --   --   --   --  3.5 2.6 3.2 3.5  --    < > = values in this interval not displayed.    CBC: Recent Labs  Lab 12/22/20 0404 12/23/20 0302 12/24/20 0255 12/24/20 0909 12/25/20 0605 12/26/20 0740  WBC 4.5 5.6 6.4  --  8.4 8.1  HGB 17.6* 16.6 16.8 16.3 16.6 15.5  HCT 54.0* 50.6 52.4* 48.0 51.0 47.7  MCV  96.1 96.4 96.1  --  97.0 97.3  PLT PLATELET CLUMPS NOTED ON SMEAR, UNABLE TO ESTIMATE 127* 159  --  150 142*     Ref. Range 12/24/2020 16:00 12/24/2020 18:19  Appearance, CSF CLEAR  CLEAR (A) CLEAR (A)  Glucose, CSF 40 - 70 mg/dL  81 (H)  RBC Count, CSF 0 /cu mm 2 (H) 6 (H)  WBC, CSF 0 - 5 /cu mm 3 3  Other Cells, CSF Unknown TOO FEW TO COUNT, SMEAR AVAILABLE FOR REVIEW TOO FEW TO COUNT, SMEAR AVAILABLE FOR REVIEW  Color, CSF COLORLESS  COLORLESS COLORLESS  Supernatant Unknown NOT INDICATED NOT INDICATED  Total  Protein, CSF 15 - 45 mg/dL  27  Tube # Unknown 4  1    Ref. Range 12/24/2020 18:19  Crypto Ag NEGATIVE  NEGATIVE   CSF gram stain NO ORGANISMS SEEN  Culture NO GROWTH 2 DAYS    GFR: 33--->31---->30---->28---->64mL/min   cEEG 12/23/2020 - 12/24/2020  Seizure without clinical signs, left frontocentral region lasting 15 seconds.  Imaging CT had without contrast reviewed which showed acute/subacute right cerebellar stroke.  CTA head and neck reviewed which showed no large vessel occlusion.   ASSESSMENT AND PLAN: 80 year old male prior Hx melanoma with mets to left frontal lobe c/b hemorrhage 2017 s/p resection, bone mets, CAD, CKD, depression, anxiety, HTN and HLD with progressive aphasia and confusion.  EEG showed refractory seizures arising from left central temporal region. ASMs were escalated to control seizures and encephalopathy persisted despite mildly improved metabolic derangements. Oncologist provided there was little to no suspicion of leptomeningeal carcinomatosis and LP was unrevealing.   - Cannot obtain MRI brain due to incompatible pacemaker.  Medication changes that were made to minimize sedation: - Trazodone was discontinued. - Clobazam was discontinued.  - Levetiracetam decreased to 500mg  bid.   In general, plasma concentrations of clobazam at any given dose are higher in the elderly and valproic acid and clobazam interactions are well known -  Clobazam can  increase serum concentrations of VPA and VPA can enhance the CNS depressant effects of clobazam. Therefore, encephalopathy may be multifactorial secondary to the sedating side effects of antiseizure medications (synergy between CBZ and VPA) in setting of metabolic derangement/hypernatremia and down-trending GFR.    Impression: Focal unaware seizures (improved) - Last electrographic seizure 12/23/2020 - 12/24/2020 lasted 15 seconds. Metastatic melanoma with brain mets Acute toxic-metabolic encephalopathy  Hyperammonemia due to Depakote toxicity (resolved) Right hemiparesis (POA) Right cerebellar stroke - Acute    Recommendations: Seizures were refractory and difficult to control and will hold off on making further changes to medications and will continue to allow clearance of clobazam metabolites. - Continue Keppra 500 mg twice daily (renally adjusted) - Continue Depakote 500 mg AM 250 mg afternoon 500mg  PM. - Continue Vimpat 150 mg twice daily. - Continue video EEG to assess for seizure frequency as ASMs are reduced.  For recurrence: Increase Vimpat to 200 mg twice daily -Continue seizure precautions -Continue delirium precautions, aspiration precautions - As needed IV Versed 2 mg for clinical seizure-like activity lasting more than 15 minutes -Management of rest of comorbidities per primary team    Lynnae Sandhoff, MD Stroke Neurology Page: [phone]

## 2020-12-27 DIAGNOSIS — N19 Unspecified kidney failure: Secondary | ICD-10-CM

## 2020-12-27 LAB — CYTOLOGY - NON PAP

## 2020-12-27 LAB — COMPREHENSIVE METABOLIC PANEL
ALT: 17 U/L (ref 0–44)
AST: 20 U/L (ref 15–41)
Albumin: 2.2 g/dL — ABNORMAL LOW (ref 3.5–5.0)
Alkaline Phosphatase: 42 U/L (ref 38–126)
Anion gap: 10 (ref 5–15)
BUN: 59 mg/dL — ABNORMAL HIGH (ref 8–23)
CO2: 23 mmol/L (ref 22–32)
Calcium: 8.2 mg/dL — ABNORMAL LOW (ref 8.9–10.3)
Chloride: 117 mmol/L — ABNORMAL HIGH (ref 98–111)
Creatinine, Ser: 2.4 mg/dL — ABNORMAL HIGH (ref 0.61–1.24)
GFR, Estimated: 26 mL/min — ABNORMAL LOW (ref >60)
Glucose, Bld: 191 mg/dL — ABNORMAL HIGH (ref 70–99)
Potassium: 4.1 mmol/L (ref 3.5–5.1)
Sodium: 150 mmol/L — ABNORMAL HIGH (ref 135–145)
Total Bilirubin: 0.5 mg/dL (ref 0.3–1.2)
Total Protein: 5.3 g/dL — ABNORMAL LOW (ref 6.5–8.1)

## 2020-12-27 LAB — GLUCOSE, CAPILLARY
Glucose-Capillary: 175 mg/dL — ABNORMAL HIGH (ref 70–99)
Glucose-Capillary: 156 mg/dL — ABNORMAL HIGH (ref 70–99)
Glucose-Capillary: 240 mg/dL — ABNORMAL HIGH (ref 70–99)
Glucose-Capillary: 192 mg/dL — ABNORMAL HIGH (ref 70–99)
Glucose-Capillary: 174 mg/dL — ABNORMAL HIGH (ref 70–99)
Glucose-Capillary: 182 mg/dL — ABNORMAL HIGH (ref 70–99)

## 2020-12-27 LAB — CBC
HCT: 44.8 % (ref 39.0–52.0)
Hemoglobin: 14.6 g/dL (ref 13.0–17.0)
MCH: 32 pg (ref 26.0–34.0)
MCHC: 32.6 g/dL (ref 30.0–36.0)
MCV: 98.2 fL (ref 80.0–100.0)
Platelets: 136 10*3/uL — ABNORMAL LOW (ref 150–400)
RBC: 4.56 MIL/uL (ref 4.22–5.81)
RDW: 14.6 % (ref 11.5–15.5)
WBC: 7.2 10*3/uL (ref 4.0–10.5)
nRBC: 0 % (ref 0.0–0.2)

## 2020-12-27 LAB — HSV 1/2 PCR, CSF
HSV-1 DNA: NEGATIVE
HSV-2 DNA: NEGATIVE

## 2020-12-27 MED ORDER — AMLODIPINE BESYLATE 5 MG PO TABS: 5.0000 mg | ORAL_TABLET | Freq: Every day | ORAL | Status: DC

## 2020-12-27 MED ORDER — FREE WATER
450.0000 mL | Status: DC
  Administered 2020-12-27 – 2020-12-29 (×11): 450 mL

## 2020-12-27 NOTE — Progress Notes (Signed)
SLP Cancellation Note  Patient Details Name: David [name]. MRN: [id number] DOB: 21-Aug-1940   Cancelled treatment:       Reason Eval/Treat Not Completed: Fatigue/lethargy limiting ability to participate. Pt currently having EEG, and is insufficiently arousable for PO trials at this time. Cortrak in place. Will continue efforts.   Shanena Pellegrino B. Quentin Ore, [facility], [place] Speech Language Pathologist Office: [phone]  David [name] 12/27/2020, 10:53 AM

## 2020-12-27 NOTE — Progress Notes (Addendum)
PROGRESS NOTE        PATIENT DETAILS Name: David [name]. Age: 80 y.o. Sex: male Date of Birth: [birth year] Admit Date: 12/21/2020 Admitting Physician No admitting provider for patient encounter. CBJ:[id number], [name], MD  Brief Narrative: Patient is a 80 y.o. male with history of metastatic melanoma with bone/lung/brain mets-s/p brain radiation/left frontal craniotomy and tumor excision (July 2017), acute promyelocytic leukemia, hemorrhagic CVA, HTN, CAD s/p CABG 2014, AV block-s/p PPM placement, OSA on CPAP, depression who presented to [facility] with aphasia-found to have EEG evidence of seizures originating from the left temporal region-subsequently transferred to [facility] for LTM EEG.  Patient was felt to have intractable seizures-and was started on multiple antiepileptics (Onfi, Depakote, Keppra, Vimpat) subsequent hospital course complicated by encephalopathy (felt to be due to Onfi/seizure meds), fever and acute CVA. See below for further details.  Subjective: More awake and alert compared to the past few days-opens eyes-squeeze my hands when asked.  Objective: Vitals: Blood pressure (!) 152/92, pulse 87, temperature 98.7 F (37.1 C), temperature source Oral, resp. rate 19, weight 91.7 kg, SpO2 95 %.   Exam: Gen Exam: Sleepy but easily arouses to a loud verbal stimuli. HEENT:atraumatic, normocephalic Chest: B/L clear to auscultation anteriorly CVS:S1S2 regular Abdomen:soft non tender, non distended Extremities:no edema Neurology: Seems to be moving all 4 extremities. Skin: no rash   Pertinent labs/radiology: NA: 150 Creatinine: 2.4 WBC: 7.2 Plt:136  CSF: WBC 3, protein 27  Stroke work-up: 9/16>> CT head: Right cerebellar CVA 9/16>> CTA head and neck: No LVO 9/16>> Echo: EF 55-60% 9/16>> LDL: 42 9/16>> A1c: 5.8 9/18: CT head: No acute abnormality-lesion seen on CT head on 9/16 in right cerebellar peduncle was likely  artifactual.  Microbiology: 9/14>>Blood culture: 1/2-staff epidermidis 9/16>> blood culture: No growth 9/16>> CSF culture: No growth  Pathology: 9/16>>CSF cytology:pending  LTM EEG overnight: No seizures  Assessment/Plan: New refractory focal seizures: No further seizures on LTM EEG (last EEG seizure on 9/15) -remains severely encephalopathic.  Neurology following.  No longer on Onfi.  Remains on Vimpat/Keppra/Depakote.  Will await further input from neurology.  Acute toxic metabolic encephalopathy: Thought to be due to to seizure medications-particularly Onfi which has a long half-life with some mild metabolic abnormalities (AKI/hypernatremia) playing a role.  Thankfully he is slowly improving-and is much more awake compared to the past few days.  Plan is to continue supportive care.  CSF studies benign-not consistent with leptomeningeal meningitis-awaiting cytology.   ?Acute right cerebellar CVA: CT head on 9/16 showed possible right cerebellar stroke-however repeat CT head on 9/18 did not show stroke-it is now felt that lesion on the right cerebellar lobe on 9/16 was a artifact.  CVA ruled out.  Back on aspirin-no longer on plavix.  PPM interrogation completed-no A. fib detected.   Fever on 9/13-?  Aspiration PNA: No further fever since 9/13-high suspicion for aspiration pneumonia given sedation/lethargy and fever.  No other sources of infection apparent.  Has completed a 5-day course of empiric antimicrobial therapy.  Coag negative staph bacteremia: 1/2 blood culture on 9/14 positive for staph epidermidis-repeat cultures on 9/16 (before vancomycin started) negative so far.  Although high suspicion for contamination-patient has PPM in place.  Since repeat cultures are negative-no longer on vancomycin.  AKI on CKD stage IIIb: AKI likely due to contrast nephropathy-received IV contrast for CT angio head/neck on 9/16 (risk  of contrast-induced nephropathy was discussed with daughter).   Thankfully creatinine now downtrending-continue supportive care-avoid nephrotoxic agents.    Acute urinary retention (more than 1000 cc on bladder scan on 9/18): Foley catheter placed-on Flomax.  Hopefully once he is a bit more awake and alert-we will be able to perform a voiding trial in the next few days.    Hypernatremia: Secondary to dehydration (no oral intake due to encephalopathy)-continue free water flushes via PEG tube.    Hyperglycemia: Likely due to tube feedings-no evidence of diabetes.  CBGs better-continue Levemir 10 units twice daily and SSI.    Recent Labs    12/26/20 2350 12/27/20 0454 12/27/20 0843  GLUCAP 170* 175* 156*    HTN: BP relatively stable-continue metoprolol-resume amlodipine over the next few days.  Reassess in 9/20.    History of CAD s/p CABG in 2014: No anginal symptoms-continue supportive care.  On aspirin/beta-blocker  History of complete heart block-PPM in place  History of hemiplegia and hemiparesis following nontraumatic intracranial hemorrhage affecting right dominant side  History of metastatic melanoma with metastasis to the lingula, right upper lung, supraventricular clavicular lymph node, left frontal lobe metastasis-s/p resection: Resume outpatient follow-up with oncology/neurooncology.    History of acute promyelocytic leukemia: Follow CBC periodically--in remission-stable for outpatient follow-up with primary oncologist.  Major depressive disorder: Trazodone/Zoloft held due to encephalopathy-resume over the next few days once mentation improves further.  Hypothyroidism: Continue levothyroxine.  Recent TSH in May was within normal limits.   OSA:on CPAP-on hold as NG tube is in place.  Nutrition: Cortak tube placed on 9/16-feedings ongoing.  Will need SLP reevaluation once encephalopathy has improved before discontinuing cortak.  Functional quadriplegia/severe debility/deconditioning: Normally able to ambulate with the help of  walker-current significant debility is from acute illness.  Once encephalopathy resolves-hopeful that we can start PT/OT.  Obesity: Estimated body mass index is 30.74 kg/m as calculated from the following:   Height as of 12/15/20: 5\' 8"  (1.727 m).   Weight as of this encounter: 91.7 kg.    Procedures : 9/16>> lumbar puncture by IR Consults:Neurology DVT Prophylaxis :Prophylactic Lovenox Code Status:DNR Family Communication:Daughter-[name] [phone] on 9/18  Disposition Plan: Status is: Inpatient  Remains inpatient appropriate because:Inpatient level of care appropriate due to severity of illness  Dispo: The patient is from: Home              Anticipated d/c is to: SNF              Patient currently is not medically stable to d/c.   Difficult to place patient No    Barriers to Discharge: Ongoing encephalopathy-intractable seizures-LTM EEG in progress-not stable for discharge.  Antimicrobial agents: Anti-infectives (From admission, onward)    Start     Dose/Rate Route Frequency Ordered Stop   12/26/20 1600  vancomycin (VANCOREADY) IVPB 1250 mg/250 mL  Status:  Discontinued        1,250 mg 166.7 mL/hr over 90 Minutes Intravenous Every 48 hours 12/24/20 1252 12/25/20 1038   12/26/20 1600  vancomycin (VANCOCIN) IVPB 1000 mg/200 mL premix  Status:  Discontinued        1,000 mg 200 mL/hr over 60 Minutes Intravenous Every 48 hours 12/25/20 1038 12/25/20 1547   12/24/20 1800  Ampicillin-Sulbactam (UNASYN) 3 g in sodium chloride 0.9 % 100 mL IVPB        3 g 200 mL/hr over 30 Minutes Intravenous Every 12 hours 12/24/20 1309 12/25/20 1754   12/24/20 1345  vancomycin (VANCOREADY)  IVPB 1750 mg/350 mL        1,750 mg 175 mL/hr over 120 Minutes Intravenous  Once 12/24/20 1251 12/24/20 1548   12/22/20 1300  ampicillin-sulbactam (UNASYN) 1.5 g in sodium chloride 0.9 % 100 mL IVPB  Status:  Discontinued        1.5 g 200 mL/hr over 30 Minutes Intravenous Every 8 hours  12/22/20 1202 12/24/20 1309   12/21/20 1900  azithromycin (ZITHROMAX) 500 mg in sodium chloride 0.9 % 250 mL IVPB  Status:  Discontinued        500 mg 250 mL/hr over 60 Minutes Intravenous Every 24 hours 12/21/20 1805 12/22/20 1058   12/21/20 1730  cefTRIAXone (ROCEPHIN) 1 g in sodium chloride 0.9 % 100 mL IVPB  Status:  Discontinued        1 g 200 mL/hr over 30 Minutes Intravenous Every 24 hours 12/21/20 1633 12/22/20 1058   12/21/20 1730  azithromycin (ZITHROMAX) tablet 500 mg  Status:  Discontinued        500 mg Oral Daily 12/21/20 1633 12/21/20 1804        Time spent: 35 minutes-Greater than 50% of this time was spent in counseling, explanation of diagnosis, planning of further management, and coordination of care.  MEDICATIONS: Scheduled Meds:  allopurinol  200 mg Per Tube Daily   aspirin  81 mg Per Tube Daily   Chlorhexidine Gluconate Cloth  6 each Topical Daily   enoxaparin (LOVENOX) injection  30 mg Subcutaneous Q24H   feeding supplement (PROSource TF)  45 mL Per Tube BID   free water  400 mL Per Tube Q4H   insulin aspart  0-15 Units Subcutaneous Q4H   insulin aspart  0-5 Units Subcutaneous QHS   insulin detemir  10 Units Subcutaneous BID   levothyroxine  137 mcg Per Tube Q0600   lidocaine (PF)  5 mL Other Once   metoprolol tartrate  37.5 mg Per Tube BID   tamsulosin  0.4 mg Oral Daily   valproic acid  500 mg Per Tube BID   And   valproic acid  250 mg Per Tube Q24H   Continuous Infusions:  sodium chloride 10 mL/hr at 12/26/20 1849   feeding supplement (OSMOLITE 1.5 CAL) 1,000 mL (12/27/20 0910)   lacosamide (VIMPAT) IV 150 mg (12/27/20 0908)   levETIRAcetam 500 mg (12/26/20 2214)   PRN Meds:.acetaminophen **OR** acetaminophen, albuterol, colchicine, midazolam, ondansetron **OR** ondansetron (ZOFRAN) IV   LABORATORY DATA: CBC: Recent Labs  Lab 12/23/20 0302 12/24/20 0255 12/24/20 0909 12/25/20 0605 12/26/20 0740 12/27/20 0551  WBC 5.6 6.4  --  8.4 8.1 7.2   HGB 16.6 16.8 16.3 16.6 15.5 14.6  HCT 50.6 52.4* 48.0 51.0 47.7 44.8  MCV 96.4 96.1  --  97.0 97.3 98.2  PLT 127* 159  --  150 142* 136*     Basic Metabolic Panel: Recent Labs  Lab 12/23/20 0302 12/24/20 0255 12/24/20 0909 12/24/20 1650 12/25/20 0605 12/25/20 1631 12/26/20 0551 12/26/20 0740 12/27/20 0551  NA 144 147* 151*  --  150*  --   --  151* 150*  K 4.9 4.3 4.1  --  4.3  --   --  4.2 4.1  CL 112* 115*  --   --  118*  --   --  123* 117*  CO2 21* 20*  --   --  17*  --   --  19* 23  GLUCOSE 129* 174*  --   --  218*  --   --  194* 191*  BUN 44* 44*  --   --  54*  --   --  63* 59*  CREATININE 2.12* 2.14*  --   --  2.30*  --   --  2.72* 2.40*  CALCIUM 8.8* 9.1  --   --  8.5*  --   --  8.1* 8.2*  MG  --   --   --  2.2 2.4 2.7* 2.8*  --   --   PHOS  --   --   --  3.5 2.6 3.2 3.5  --   --      GFR: Estimated Creatinine Clearance: 26.5 mL/min (A) (by C-G formula based on SCr of 2.4 mg/dL (H)).  Liver Function Tests: Recent Labs  Lab 12/23/20 0302 12/24/20 0255 12/26/20 0740 12/27/20 0551  AST 41 27 18 20   ALT 35 26 15 17   ALKPHOS 44 45 41 42  BILITOT 0.8 0.6 0.5 0.5  PROT 6.0* 6.2* 5.3* 5.3*  ALBUMIN 3.0* 2.9* 2.2* 2.2*    No results for input(s): LIPASE, AMYLASE in the last 168 hours. Recent Labs  Lab 12/21/20 0914 12/23/20 0917 12/25/20 0605 12/26/20 0740  AMMONIA 42* 32 52* 42*     Coagulation Profile: No results for input(s): INR, PROTIME in the last 168 hours.   Cardiac Enzymes: No results for input(s): CKTOTAL, CKMB, CKMBINDEX, TROPONINI in the last 168 hours.  BNP (last 3 results) No results for input(s): PROBNP in the last 8760 hours.  Lipid Profile: Recent Labs    12/24/20 1903  CHOL 108  HDL 34*  LDLCALC 42  TRIG 161*  CHOLHDL 3.2     Thyroid Function Tests: No results for input(s): TSH, T4TOTAL, FREET4, T3FREE, THYROIDAB in the last 72 hours.  Anemia Panel: No results for input(s): VITAMINB12, FOLATE, FERRITIN, TIBC,  IRON, RETICCTPCT in the last 72 hours.  Urine analysis:    Component Value Date/Time   COLORURINE YELLOW 12/22/2020 0340   APPEARANCEUR CLEAR 12/22/2020 0340   LABSPEC 1.020 12/22/2020 0340   PHURINE 6.0 12/22/2020 0340   GLUCOSEU NEGATIVE 12/22/2020 0340   GLUCOSEU NEGATIVE 10/12/2020 1118   HGBUR LARGE (A) 12/22/2020 0340   BILIRUBINUR NEGATIVE 12/22/2020 0340   KETONESUR NEGATIVE 12/22/2020 0340   PROTEINUR 100 (A) 12/22/2020 0340   UROBILINOGEN 0.2 10/12/2020 1118   NITRITE NEGATIVE 12/22/2020 0340   LEUKOCYTESUR NEGATIVE 12/22/2020 0340    Sepsis Labs: Lactic Acid, Venous    Component Value Date/Time   LATICACIDVEN 0.8 01/03/2019 0046    MICROBIOLOGY: Recent Results (from the past 240 hour(s))  Resp Panel by RT-PCR (Flu A&B, Covid) Nasopharyngeal Swab     Status: None   Collection Time: 12/21/20  4:34 PM   Specimen: Nasopharyngeal Swab; Nasopharyngeal(NP) swabs in vial transport medium  Result Value Ref Range Status   SARS Coronavirus 2 by RT PCR NEGATIVE NEGATIVE Final    Comment: (NOTE) SARS-CoV-2 target nucleic acids are NOT DETECTED.  The SARS-CoV-2 RNA is generally detectable in upper respiratory specimens during the acute phase of infection. The lowest concentration of SARS-CoV-2 viral copies this assay can detect is 138 copies/mL. A negative result does not preclude SARS-Cov-2 infection and should not be used as the sole basis for treatment or other patient management decisions. A negative result may occur with  improper specimen collection/handling, submission of specimen other than nasopharyngeal swab, presence of viral mutation(s) within the areas targeted by this assay, and inadequate number of viral copies(<138 copies/mL). A negative result must be combined  with clinical observations, patient history, and epidemiological information. The expected result is Negative.  Fact Sheet for Patients:  EntrepreneurPulse.com.au  Fact Sheet  for Healthcare Providers:  IncredibleEmployment.be  This test is no t yet approved or cleared by the [place] FDA and  has been authorized for detection and/or diagnosis of SARS-CoV-2 by FDA under an Emergency Use Authorization (EUA). This EUA will remain  in effect (meaning this test can be used) for the duration of the COVID-19 declaration under Section 564(b)(1) of the Act, 21 U.S.C.section 360bbb-3(b)(1), unless the authorization is terminated  or revoked sooner.       Influenza A by PCR NEGATIVE NEGATIVE Final   Influenza B by PCR NEGATIVE NEGATIVE Final    Comment: (NOTE) The Xpert Xpress SARS-CoV-2/FLU/RSV plus assay is intended as an aid in the diagnosis of influenza from Nasopharyngeal swab specimens and should not be used as a sole basis for treatment. Nasal washings and aspirates are unacceptable for Xpert Xpress SARS-CoV-2/FLU/RSV testing.  Fact Sheet for Patients: EntrepreneurPulse.com.au  Fact Sheet for Healthcare Providers: IncredibleEmployment.be  This test is not yet approved or cleared by the [place] FDA and has been authorized for detection and/or diagnosis of SARS-CoV-2 by FDA under an Emergency Use Authorization (EUA). This EUA will remain in effect (meaning this test can be used) for the duration of the COVID-19 declaration under Section 564(b)(1) of the Act, 21 U.S.C. section 360bbb-3(b)(1), unless the authorization is terminated or revoked.  Performed at [facility], [place] [address], [place], [place] [address]   MRSA Next Gen by PCR, Nasal     Status: Abnormal   Collection Time: 12/21/20  4:36 PM   Specimen: Nasopharyngeal Swab; Nasal Swab  Result Value Ref Range Status   MRSA by PCR Next Gen NEGATIVE (A) NOT DETECTED Final    Comment:        The GeneXpert MRSA Assay (FDA approved for NASAL specimens only), is one component of a comprehensive MRSA colonization surveillance  program. It is not intended to diagnose MRSA infection nor to guide or monitor treatment for MRSA infections. Performed at [facility], [place] [address] [place], [place] [address]   Culture, blood (routine x 2)     Status: Abnormal   Collection Time: 12/21/20  5:13 PM   Specimen: BLOOD RIGHT HAND  Result Value Ref Range Status   Specimen Description BLOOD RIGHT HAND  Final   Special Requests   Final    BOTTLES DRAWN AEROBIC ONLY Blood Culture results may not be optimal due to an inadequate volume of blood received in culture bottles   Culture  Setup Time   Final    GRAM POSITIVE COCCI IN CLUSTERS AEROBIC BOTTLE ONLY CRITICAL RESULT CALLED TO, READ BACK BY AND VERIFIED WITH: PHARMD ALEX L [id number] [id number] FCP    Culture (A)  Final    STAPHYLOCOCCUS EPIDERMIDIS THE SIGNIFICANCE OF ISOLATING THIS ORGANISM FROM A SINGLE SET OF BLOOD CULTURES WHEN MULTIPLE SETS ARE DRAWN IS UNCERTAIN. PLEASE NOTIFY THE MICROBIOLOGY DEPARTMENT WITHIN ONE WEEK IF SPECIATION AND SENSITIVITIES ARE REQUIRED. Performed at [facility], [place] [address], [place], [place] [address]    Report Status 12/24/2020 FINAL  Final  Blood Culture ID Panel (Reflexed)     Status: Abnormal   Collection Time: 12/21/20  5:13 PM  Result Value Ref Range Status   Enterococcus faecalis NOT DETECTED NOT DETECTED Final   Enterococcus Faecium NOT DETECTED NOT DETECTED Final   Listeria monocytogenes  NOT DETECTED NOT DETECTED Final   Staphylococcus species DETECTED (A) NOT DETECTED Final    Comment: CRITICAL RESULT CALLED TO, READ BACK BY AND VERIFIED WITH: PHARMD ALEX L [id number] [id number] FCP    Staphylococcus aureus (BCID) NOT DETECTED NOT DETECTED Final   Staphylococcus epidermidis DETECTED (A) NOT DETECTED Final    Comment: Methicillin (oxacillin) resistant coagulase negative staphylococcus. Possible blood culture contaminant (unless isolated from more than one blood culture draw or clinical case suggests pathogenicity). No  antibiotic treatment is indicated for blood  culture contaminants. CRITICAL RESULT CALLED TO, READ BACK BY AND VERIFIED WITH: PHARMD ALEX L [id number] [id number] FCP    Staphylococcus lugdunensis NOT DETECTED NOT DETECTED Final   Streptococcus species NOT DETECTED NOT DETECTED Final   Streptococcus agalactiae NOT DETECTED NOT DETECTED Final   Streptococcus pneumoniae NOT DETECTED NOT DETECTED Final   Streptococcus pyogenes NOT DETECTED NOT DETECTED Final   A.calcoaceticus-baumannii NOT DETECTED NOT DETECTED Final   Bacteroides fragilis NOT DETECTED NOT DETECTED Final   Enterobacterales NOT DETECTED NOT DETECTED Final   Enterobacter cloacae complex NOT DETECTED NOT DETECTED Final   Escherichia coli NOT DETECTED NOT DETECTED Final   Klebsiella aerogenes NOT DETECTED NOT DETECTED Final   Klebsiella oxytoca NOT DETECTED NOT DETECTED Final   Klebsiella pneumoniae NOT DETECTED NOT DETECTED Final   Proteus species NOT DETECTED NOT DETECTED Final   Salmonella species NOT DETECTED NOT DETECTED Final   Serratia marcescens NOT DETECTED NOT DETECTED Final   Haemophilus influenzae NOT DETECTED NOT DETECTED Final   Neisseria meningitidis NOT DETECTED NOT DETECTED Final   Pseudomonas aeruginosa NOT DETECTED NOT DETECTED Final   Stenotrophomonas maltophilia NOT DETECTED NOT DETECTED Final   Candida albicans NOT DETECTED NOT DETECTED Final   Candida auris NOT DETECTED NOT DETECTED Final   Candida glabrata NOT DETECTED NOT DETECTED Final   Candida krusei NOT DETECTED NOT DETECTED Final   Candida parapsilosis NOT DETECTED NOT DETECTED Final   Candida tropicalis NOT DETECTED NOT DETECTED Final   Cryptococcus neoformans/gattii NOT DETECTED NOT DETECTED Final   Methicillin resistance mecA/C DETECTED (A) NOT DETECTED Final    Comment: CRITICAL RESULT CALLED TO, READ BACK BY AND VERIFIED WITH: PHARMD ALEX L [id number] FCP Performed at [facility], [address], [place], [place] [address]   Culture,  blood (routine x 2)     Status: None   Collection Time: 12/21/20  5:45 PM   Specimen: BLOOD RIGHT ARM  Result Value Ref Range Status   Specimen Description BLOOD RIGHT ARM  Final   Special Requests   Final    BOTTLES DRAWN AEROBIC ONLY Blood Culture adequate volume   Culture   Final    NO GROWTH 5 DAYS Performed at [facility], [place] [address] [place], [place] [address]    Report Status 12/26/2020 FINAL  Final  Urine Culture     Status: Abnormal   Collection Time: 12/22/20  3:40 AM   Specimen: Urine, Clean Catch  Result Value Ref Range Status   Specimen Description URINE, CLEAN CATCH  Final   Special Requests NONE  Final   Culture (A)  Final    <10,000 COLONIES/mL INSIGNIFICANT GROWTH Performed at [facility], [place] [address] [place], [place] [address]    Report Status 12/23/2020 FINAL  Final  Culture, blood (routine x 2)     Status: None (Preliminary result)   Collection Time: 12/24/20 11:39 AM   Specimen: BLOOD LEFT HAND  Result  Value Ref Range Status   Specimen Description BLOOD LEFT HAND  Final   Special Requests BOTTLES DRAWN AEROBIC ONLY  Final   Culture   Final    NO GROWTH 3 DAYS Performed at [facility], [address] [place], [place] [address]    Report Status PENDING  Incomplete  Culture, blood (routine x 2)     Status: None (Preliminary result)   Collection Time: 12/24/20 11:49 AM   Specimen: BLOOD RIGHT HAND  Result Value Ref Range Status   Specimen Description BLOOD RIGHT HAND  Final   Special Requests BOTTLES DRAWN AEROBIC ONLY  Final   Culture   Final    NO GROWTH 3 DAYS Performed at [facility], [place] [address] [place], [place] [address]    Report Status PENDING  Incomplete  CSF culture w Gram Stain     Status: None (Preliminary result)   Collection Time: 12/24/20  6:19 PM   Specimen: CSF; Cerebrospinal Fluid  Result Value Ref Range Status   Specimen Description CSF  Final   Special Requests Normal  Final   Gram Stain  NO ORGANISMS SEEN NO WBC SEEN CYTOSPIN SMEAR   Final   Culture   Final    NO GROWTH 3 DAYS Performed at [facility], [place] [address] [place], [place] [address]    Report Status PENDING  Incomplete    RADIOLOGY STUDIES/RESULTS: CT HEAD WO CONTRAST (5MM)  Result Date: 12/26/2020 CLINICAL DATA:  Stroke, follow up. EXAM: CT HEAD WITHOUT CONTRAST TECHNIQUE: Contiguous axial images were obtained from the base of the skull through the vertex without intravenous contrast. COMPARISON:  12/24/2020 FINDINGS: Brain: Hypoattenuation involving the right middle cerebellar peduncle and adjacent cerebellar hemisphere on the prior CT is not present on today's study favoring artifact on the prior exam. There is no evidence of an acute infarct, intracranial hemorrhage, mass, midline shift, or extra-axial fluid collection. Extensive encephalomalacia is again noted in the left frontal lobe with ex vacuo dilatation of the left lateral ventricle. Hypodensities elsewhere in the cerebral white matter bilaterally are unchanged and nonspecific but compatible with mild chronic small vessel ischemic disease. Vascular: No hyperdense vessel. Skull: Left frontal craniotomy. Sinuses/Orbits: Visualized paranasal sinuses and mastoid air cells are clear. Bilateral cataract extraction. Left scleral buckle. Other: None. IMPRESSION: 1. No evidence of acute intracranial abnormality. 2. Low density in the right middle cerebellar peduncle on the prior CT is no longer evident and was likely artifactual. Electronically Signed   By: Logan Bores M.D.   On: 12/26/2020 13:47     LOS: 9 days   Oren Binet, MD  [facility]    To contact the attending provider between 7A-7P or the covering provider during after hours 7P-7A, please log into the web site [email or web address] and access using universal [place] password for that web site. If you do not have the password, please call the hospital operator.  12/27/2020, 10:21 AM

## 2020-12-27 NOTE — Progress Notes (Addendum)
Subjective: No acute events overnight.  Per RN's note " For 0400 assessment, was more talkative and even told me his daughter's name as well as his son's name.  Was even following simple commands and interacting and answering simple questions.  Denies pain."   ROS: Unable to obtain due to poor mental status  Examination  Vital signs in last 24 hours: Temp:  [98.6 F (37 C)-98.8 F (37.1 C)] 98.6 F (37 C) (09/19 0434) Pulse Rate:  [80-94] 87 (09/19 0825) Resp:  [19-23] 19 (09/18 2339) BP: (136-156)/(87-100) 152/92 (09/19 0825) SpO2:  [92 %-96 %] 95 % (09/19 0825) Weight:  [91.7 kg] 91.7 kg (09/19 0447)  General: lying in bed, NAD CVS: pulse-normal rate and rhythm RS: breathing comfortably, coarse breath sounds bilaterally Extremities: normal, warm Neuro: Opens eyes to repeated verbal and tactile stimulation, follows simple commands like sticking out his tongue, did not answer orientation questions, cranial nerves II to XII grossly intact, right hemiparesis, antigravity strength in left upper and lower extremity   Basic Metabolic Panel: Recent Labs  Lab 12/23/20 0302 12/24/20 0255 12/24/20 0909 12/24/20 1650 12/25/20 0605 12/25/20 1631 12/26/20 0551 12/26/20 0740 12/27/20 0551  NA 144 147* 151*  --  150*  --   --  151* 150*  K 4.9 4.3 4.1  --  4.3  --   --  4.2 4.1  CL 112* 115*  --   --  118*  --   --  123* 117*  CO2 21* 20*  --   --  17*  --   --  19* 23  GLUCOSE 129* 174*  --   --  218*  --   --  194* 191*  BUN 44* 44*  --   --  54*  --   --  63* 59*  CREATININE 2.12* 2.14*  --   --  2.30*  --   --  2.72* 2.40*  CALCIUM 8.8* 9.1  --   --  8.5*  --   --  8.1* 8.2*  MG  --   --   --  2.2 2.4 2.7* 2.8*  --   --   PHOS  --   --   --  3.5 2.6 3.2 3.5  --   --     CBC: Recent Labs  Lab 12/23/20 0302 12/24/20 0255 12/24/20 0909 12/25/20 0605 12/26/20 0740 12/27/20 0551  WBC 5.6 6.4  --  8.4 8.1 7.2  HGB 16.6 16.8 16.3 16.6 15.5 14.6  HCT 50.6 52.4* 48.0 51.0 47.7  44.8  MCV 96.4 96.1  --  97.0 97.3 98.2  PLT 127* 159  --  150 142* 136*     Coagulation Studies: No results for input(s): LABPROT, INR in the last 72 hours.  Imaging CT head without contrast 12/26/2020: No evidence of acute intracranial abnormality. Low density in the right middle cerebellar peduncle on the prior CT is no longer evident and was likely artifactual.  ASSESSMENT AND PLAN: 80 year old male with prior history of melanoma with mets to left frontal lobe complicated by hemorrhage in 2017 status postresection, bone mets, coronary artery disease, CKD, depression, anxiety, hypertension and hyperlipidemia who presented with progressive aphasia and confusion.  EEG showed seizures arising from left central temporal region which has been difficult to control with escalating doses of AEDs.   Focal seizures with alteration of awareness ( improved) Metastatic melanoma with brain mets status post resection and radiation Acute toxic-metabolic encephalopathy (improving) Right hemiparesis (POA) -No seizures  overnight, last seizure was on 12/23/2020, lasted about 15 seconds -Encephalopathy likely due to AEDs (Onfi), AKI   Recommendations - Discontinue keppra as patient remains seizure and to minimize sedation -Continue Depakote 500 mg twice daily and 250 mg in the afternoon, Vimpat 150 mg twice daily -We will discontinue video EEG tomorrow if patient continues to be more awake and remains seizure-free -If patient has sz recurrence, will increase Vimpat to 200 mg twice daily -Continue seizure precautions -Continue delirium precautions, aspiration precautions - as needed IV Versed 2 mg for clinical seizure-like activity lasting more than 15 minutes -Management of rest of comorbidities per primary team -Discussed my plan with Dr Sloan Leiter and updated daughter via message  I have spent a total of  35  minutes with the patient reviewing hospital notes,  test results, labs and examining the patient  as well as establishing an assessment and plan.  > 50% of time was spent in direct patient care.    Zeb Comfort Epilepsy [facility] For questions after 5pm please refer to AMION to reach the Neurologist on call

## 2020-12-27 NOTE — Progress Notes (Signed)
Pt is still on Cont EEG and core track in place, unable to place pt on CPAP at this time. No distress noted at this time.

## 2020-12-27 NOTE — Progress Notes (Signed)
For 0400 assessment, was more talkative and even told me his daughter's name as well as his son's name.  Was even following simple commands and interacting and answering simple questions.  Denies pain.

## 2020-12-27 NOTE — Procedures (Addendum)
Patient Name: David [name].  MRN: [id number]  Epilepsy Attending: Lora Havens  Referring Physician/Provider: Dr Zeb Comfort Duration: 12/26/2020 1133 to 12/27/2020 1133   Patient history: 80 year old male with history of CVA, ICH, melanoma with mets to lung and brain who presented with aphasia.  EEG to evaluate for seizures.   Level of alertness: lethargic   AEDs during EEG study: LEV, VPA, Vimpat   Technical aspects: This EEG study was done with scalp electrodes positioned according to the 10-20 International system of electrode placement. Electrical activity was acquired at a sampling rate of 500Hz  and reviewed with a high frequency filter of 70Hz  and a low frequency filter of 1Hz . EEG data were recorded continuously and digitally stored.    Description: EEG showed continuous generalized 3-5hz  theta-delta slowing. There was also 3 to 5 Hz sharply contoured theta-delta slowing with overriding 15 to 18 Hz beta activity in left frontal region consistent with prior craniotomy.  Abundant qasi periodic sharp waves were seen in left frontal region.      ABNORMALITY -Sharp wave, left frontal region -Breach artifact, left frontal region   IMPRESSION: This study showed  evidence of epileptogenicity and cortical dysfunction in left frontal region consistent with prior craniotomy. No definite seizures were seen during this study.      David [name]

## 2020-12-27 NOTE — Plan of Care (Signed)
  Problem: Clinical Measurements: Goal: Diagnostic test results will improve Outcome: Progressing Goal: Respiratory complications will improve Outcome: Progressing Goal: Cardiovascular complication will be avoided Outcome: Progressing   

## 2020-12-27 NOTE — Progress Notes (Signed)
EEG maintenance performed.  No skin breakdown observed at electrode sites F4, Fp2, P3.  Repositioned Fp1 due to skin breakdown at that site.

## 2020-12-27 NOTE — Progress Notes (Signed)
Occupational Therapy Treatment Patient Details Name: David [name]. MRN: [id number] DOB: 1940/11/29 Today's Date: 12/27/2020   History of present illness 80 y.o. maled presented to ER 12/17/2020 secondary to AMS, word-finding difficulties; admitted for TIA/CVA work up.  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  Per neurology, patient noted with active seizure activity on EEG, correlating with episodes of speech difficulties. Keppra initiated (and planning to increase dosage). 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  Pt with change in status ~9/15 with new onset RT sided weakness, inability to follow commands, and lethargy.   OT comments  Patient remains with need of Total assist of 2 people for all mobility and Total Assist for all bed level ADLs. Pt progress a little as he is now more alert and attempting to follow commands with ability to follow command to close and open his eyes and to squeeze OT's hand placed in pt's LT hand.  Pt continues to have zero sitting balance at EOB and is not yet safe to work on functional mobility out of the bed.  Pt's goals and care plan have been updated to reflect pt's current needs.  Pt continues to demonstrate fair to guarded rehab potential and could benefit from continued skilled OT to increase safety and independence with ADLs and functional transfers to allow pt to return home safely and reduce caregiver burden and fall risk.    Recommendations for follow up therapy are one component of a multi-disciplinary discharge planning process, led by the attending physician.  Recommendations may be updated based on patient status, additional functional criteria and  insurance authorization.    Follow Up Recommendations  SNF;Supervision/Assistance - 24 hour    Equipment Recommendations   (Will defer to post-acture recommendations)    Recommendations for Other Services      Precautions / Restrictions Precautions Precautions: Fall Precaution Comments: seziure precautions Required Braces or Orthoses:  (Bil heel protectors.) Restrictions Weight Bearing Restrictions: No       Mobility Bed Mobility Overal bed mobility: Needs Assistance Bed Mobility: Sidelying to Sit;Rolling Rolling: Total assist;+2 for physical assistance;+2 for safety/equipment Sidelying to sit: Total assist;+2 for physical assistance;+2 for safety/equipment Supine to sit: Total assist;+2 for physical assistance;+2 for safety/equipment;HOB elevated Sit to supine: Total assist;+2 for physical assistance;+2 for safety/equipment   General bed mobility comments: two person total assist to roll to RT. totalA+2 for BLE and trunk management during supine<>sit    Transfers                 General transfer comment: unable to support himself in sitting, so did not attempt    Balance Overall balance assessment: Needs assistance Sitting-balance support: Single extremity supported;Feet supported Sitting balance-Leahy Scale: Zero Sitting balance - Comments: Max-Total Assist from physical therapist to sit EOB throughout session.       Standing balance comment: NT for safety                           ADL either performed or assessed with clinical judgement   ADL Overall ADL's : Needs assistance/impaired Eating/Feeding: NPO;Total assistance Eating/Feeding Details (indicate cue type and reason): NGT Grooming: Sitting;Total assistance Grooming Details (indicate cue type and reason): Washcloth placed in LT hand and hand  over hand assist for pt to wipe face while EOB, assisted by PT for sitting balance. Pt required Total As to wash face and brush teeth (toothpaste not  used for pt safety).                     Toileting- Clothing Manipulation and Hygiene: Total assistance Toileting - Clothing Manipulation Details (indicate cue type and reason): On Foley cath     Functional mobility during ADLs: Total assistance;+2 for physical assistance General ADL Comments: at this time pt requiring totalA for all ADL/IADL     Vision Ability to See in Adequate Light: 3 Highly impaired Additional Comments: Absent pursuits. No eye contact.  Pt did close and open eyes to command with verbal and visual cues.   Perception     Praxis      Cognition Arousal/Alertness: Lethargic Behavior During Therapy: Flat affect Overall Cognitive Status: Impaired/Different from baseline Area of Impairment: Orientation;Attention;Following commands;Memory;Safety/judgement;Problem solving;Awareness                 Orientation Level: Place;Time;Situation (Attempting to vocalize name when asked, but unintelligible.) Current Attention Level: Focused Memory: Decreased recall of precautions;Decreased short-term memory Following Commands: Follows one step commands inconsistently (with increased time) Safety/Judgement: Decreased awareness of safety;Decreased awareness of deficits Awareness:  (diffiucult to assess) Problem Solving: Slow processing;Decreased initiation;Difficulty sequencing;Requires verbal cues;Requires tactile cues General Comments: Pt able to follow 1-step commands <25% of time but more than 10%. No intelligible vocalizations.        Exercises Exercises: Other exercises Other Exercises Other Exercises: LT UE PROM with OT working on shoulder and elbow ROM in all planes and PT assessing/ppalpating scapulo-thoracic glide which is reportedly intact. Light tone noted throughout LUE movements. RT UE flaccid and supported on 2 pillows once pt supine. Other Exercises: LUE PROM activty with OT. Scapula appears to be gliding appropriately with UE movement.    Shoulder Instructions       General Comments EEG wires remained intact. 1 black lead diconnected when PT/OT entered room. Noted 1.5-2 finger width subluxation on RUE    Pertinent Vitals/ Pain       Pain Assessment: Faces Faces Pain Scale: Hurts little more Pain Location: with bed mobility and with genelt neck extension. Pain Descriptors / Indicators: Moaning Pain Intervention(s): Limited activity within patient's tolerance;Monitored during session;Repositioned  Home Living                                          Prior Functioning/Environment              Frequency  Min 2X/week        Progress Toward Goals  OT Goals(current goals can now be found in the care plan section)  Progress towards OT goals: Goals drowngraded-see care plan  Acute Rehab OT Goals Patient Stated Goal: pt did not state OT Goal Formulation: Patient unable to participate in goal setting Time For Goal Achievement: 01/10/21 ADL Goals Pt Will Perform Grooming: bed level;with mod assist Pt Will Perform Upper Body Bathing: bed level;with mod assist Pt/caregiver will Perform Home Exercise Program: Increased ROM;Increased strength;Both right and left upper extremity (with Mod-Max As, bed or recliner level.) Additional ADL Goal #1: Pt will improve sitting balance to at least fair static in order to increase safety and participation during seated ADLs. Additional ADL Goal #2: Pt will demonstrate improved cognition my  following at least 50% of 1-step commands, and identifying 3/5 familiar objects placed within his line of vision.  Plan Discharge plan needs to be updated    Co-evaluation      Reason for Co-Treatment: Complexity of the patient's impairments (multi-system involvement);For patient/therapist safety PT goals addressed during session: Mobility/safety with mobility;Balance;Strengthening/ROM        AM-PAC OT "6 Clicks" Daily Activity     Outcome Measure   Help from  another person eating meals?: Total Help from another person taking care of personal grooming?: Total Help from another person toileting, which includes using toliet, bedpan, or urinal?: Total Help from another person bathing (including washing, rinsing, drying)?: Total Help from another person to put on and taking off regular upper body clothing?: Total Help from another person to put on and taking off regular lower body clothing?: Total 6 Click Score: 6    End of Session    OT Visit Diagnosis: Other abnormalities of gait and mobility (R26.89);Other symptoms and signs involving cognitive function;Feeding difficulties (R63.3);Hemiplegia and hemiparesis Hemiplegia - Right/Left: Right Hemiplegia - caused by: Unspecified   Activity Tolerance Patient limited by lethargy   Patient Left in bed;with bed alarm set;with SCD's reapplied;Other (comment) (heels floated, all extremities supported on pillows)   Nurse Communication  (RN cleared PT/OT to work with pt.)        Time: [phone] OT Time Calculation (min): 26 min  Charges: OT General Charges $OT Visit: 1 Visit OT Treatments $Cognitive Funtion inital: Initial 15 mins  Anderson Malta, OT Acute Rehab Services Office: [phone] 12/27/2020  Julien Girt 12/27/2020, 9:58 AM

## 2020-12-27 NOTE — Progress Notes (Signed)
LTM maint complete - no new skin breakdown Fp2 has scab, F3 F7 no breakdown  Serviced Cz and C3 [facility] monitored, Event button test confirmed by [facility].

## 2020-12-27 NOTE — Care Management Important Message (Signed)
Important Message  Patient Details  Name: David [name]. MRN: [id number] Date of Birth: 01-Oct-1940   Medicare Important Message Given:  Yes     Memory Argue 12/27/2020, 12:42 PM

## 2020-12-27 NOTE — Progress Notes (Signed)
Physical Therapy Re-Evaluation  Patient Details Name: David [name]. MRN: [id number] DOB: [birth year] Today's Date: 12/27/2020   History of Present Illness 80 y.o. maled presented to ER 01/03/2021 secondary to AMS, word-finding difficulties; admitted for TIA/CVA work up.  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  Per neurology, patient noted with active seizure activity on EEG, correlating with episodes of speech difficulties. Keppra initiated (and planning to increase dosage). 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  Pt with change in status ~9/15 with new onset RT sided weakness, inability to follow commands, and lethargy. CT 9/18 revealed no acute changes.     PT Comments    Pt seen in conjunction with OT to assess current function and maximize functional mobility and safety throughout session. Pt continues to be lethargic with decreased ability to follow commands, verbalize, or initiate movement during mobility. Overall pt remains at a +2 total assist level for basic bed mobility and transfers. The Maxi-Move lift is appropriate for OOB to chair mobility at this time with nursing staff. Goals downgraded this date to reflect current function. Will continue to follow and progress as able per POC.    Recommendations for follow up therapy are one component of a multi-disciplinary discharge planning process, led by the attending physician.  Recommendations may be updated based on patient status, additional functional criteria and insurance authorization.  Follow Up Recommendations  SNF     Equipment Recommendations  Wheelchair (measurements PT);Wheelchair cushion (measurements PT);Hospital  bed;Other (comment) (hoyer lift)    Recommendations for Other Services       Precautions / Restrictions Precautions Precautions: Fall Precaution Comments: seziure precautions Required Braces or Orthoses:  (Bil heel protectors.) Restrictions Weight Bearing Restrictions: No     Mobility  Bed Mobility Overal bed mobility: Needs Assistance Bed Mobility: Sidelying to Sit;Rolling Rolling: Total assist;+2 for physical assistance;+2 for safety/equipment Sidelying to sit: Total assist;+2 for physical assistance;+2 for safety/equipment Supine to sit: Total assist;+2 for physical assistance;+2 for safety/equipment;HOB elevated Sit to supine: Total assist;+2 for physical assistance;+2 for safety/equipment   General bed mobility comments: two person total assist to roll to RT. totalA+2 for BLE and trunk management during supine<>sit    Transfers                 General transfer comment: unable to support himself in sitting, so did not attempt  Ambulation/Gait             General Gait Details: Unable to attempt this session   Stairs             Wheelchair Mobility    Modified Rankin (Stroke Patients Only)       Balance Overall balance assessment: Needs assistance Sitting-balance support: Single extremity supported;Feet supported Sitting balance-Leahy Scale: Zero Sitting balance - Comments: Max-Total Assist from physical therapist to sit EOB throughout session.       Standing balance comment: NT for safety                            Cognition Arousal/Alertness: Lethargic Behavior During Therapy: Flat affect Overall Cognitive Status: Impaired/Different from baseline Area of Impairment: Orientation;Attention;Following commands;Memory;Safety/judgement;Problem solving;Awareness  Orientation Level: Place;Time;Situation (Attempting to vocalize name when asked, but unintelligible.) Current Attention Level: Focused Memory:  Decreased recall of precautions;Decreased short-term memory Following Commands: Follows one step commands inconsistently (with increased time) Safety/Judgement: Decreased awareness of safety;Decreased awareness of deficits Awareness:  (diffiucult to assess) Problem Solving: Slow processing;Decreased initiation;Difficulty sequencing;Requires verbal cues;Requires tactile cues General Comments: Pt able to follow 1-step commands <25% of time but more than 10%. No intelligible vocalizations.      Exercises Other Exercises Other Exercises: LT UE PROM with OT working on shoulder and elbow ROM in all planes and PT assessing/ppalpating scapulo-thoracic glide which is reportedly intact. Light tone noted throughout LUE movements. RT UE flaccid and supported on 2 pillows once pt supine. Other Exercises: LUE PROM activty with OT. Scapula appears to be gliding appropriately with UE movement.    General Comments General comments (skin integrity, edema, etc.): EEG wires remained intact. 1 black lead diconnected when PT/OT entered room. Noted 1.5-2 finger width subluxation on RUE      Pertinent Vitals/Pain Pain Assessment: Faces Faces Pain Scale: Hurts little more Pain Location: with bed mobility and with genelt neck extension. Pain Descriptors / Indicators: Moaning Pain Intervention(s): Limited activity within patient's tolerance;Monitored during session;Repositioned    Home Living                      Prior Function            PT Goals (current goals can now be found in the care plan section) Acute Rehab PT Goals Patient Stated Goal: pt did not state PT Goal Formulation: Patient unable to participate in goal setting Time For Goal Achievement: 01/10/21 Potential to Achieve Goals: Fair Progress towards PT goals: Goals downgraded-see care plan    Frequency    Min 2X/week      PT Plan Current plan remains appropriate    Co-evaluation PT/OT/SLP Co-Evaluation/Treatment:  Yes Reason for Co-Treatment: Complexity of the patient's impairments (multi-system involvement);For patient/therapist safety PT goals addressed during session: Mobility/safety with mobility;Balance;Strengthening/ROM        AM-PAC PT "6 Clicks" Mobility   Outcome Measure  Help needed turning from your back to your side while in a flat bed without using bedrails?: Total Help needed moving from lying on your back to sitting on the side of a flat bed without using bedrails?: Total Help needed moving to and from a bed to a chair (including a wheelchair)?: Total Help needed standing up from a chair using your arms (e.g., wheelchair or bedside chair)?: Total Help needed to walk in hospital room?: Total Help needed climbing 3-5 steps with a railing? : Total 6 Click Score: 6    End of Session Equipment Utilized During Treatment: Gait belt Activity Tolerance: Patient limited by fatigue;Patient limited by lethargy Patient left: in bed;with call bell/phone within reach;with bed alarm set;with family/visitor present Nurse Communication: Mobility status PT Visit Diagnosis: Muscle weakness (generalized) (M62.81);Difficulty in walking, not elsewhere classified (R26.2);Hemiplegia and hemiparesis Hemiplegia - Right/Left: Right Hemiplegia - dominant/non-dominant: Dominant Hemiplegia - caused by: Other cerebrovascular disease (post ictal)     Time: [id number] PT Time Calculation (min) (ACUTE ONLY): 30 min  Charges:                        Rolinda Roan, PT, DPT Acute Rehabilitation Services Pager: [phone] Office: [phone]    Thelma Comp 12/27/2020, 9:55 AM

## 2020-12-28 LAB — CBC
HCT: 45.4 % (ref 39.0–52.0)
Hemoglobin: 14.8 g/dL (ref 13.0–17.0)
MCH: 31.7 pg (ref 26.0–34.0)
MCHC: 32.6 g/dL (ref 30.0–36.0)
MCV: 97.2 fL (ref 80.0–100.0)
Platelets: 147 10*3/uL — ABNORMAL LOW (ref 150–400)
RBC: 4.67 MIL/uL (ref 4.22–5.81)
RDW: 14.2 % (ref 11.5–15.5)
WBC: 7.4 10*3/uL (ref 4.0–10.5)
nRBC: 0 % (ref 0.0–0.2)

## 2020-12-28 LAB — COMPREHENSIVE METABOLIC PANEL
ALT: 16 U/L (ref 0–44)
AST: 20 U/L (ref 15–41)
Albumin: 2.3 g/dL — ABNORMAL LOW (ref 3.5–5.0)
Alkaline Phosphatase: 42 U/L (ref 38–126)
Anion gap: 9 (ref 5–15)
BUN: 50 mg/dL — ABNORMAL HIGH (ref 8–23)
CO2: 25 mmol/L (ref 22–32)
Calcium: 8.4 mg/dL — ABNORMAL LOW (ref 8.9–10.3)
Chloride: 115 mmol/L — ABNORMAL HIGH (ref 98–111)
Creatinine, Ser: 1.87 mg/dL — ABNORMAL HIGH (ref 0.61–1.24)
GFR, Estimated: 36 mL/min — ABNORMAL LOW (ref >60)
Glucose, Bld: 163 mg/dL — ABNORMAL HIGH (ref 70–99)
Potassium: 3.9 mmol/L (ref 3.5–5.1)
Sodium: 149 mmol/L — ABNORMAL HIGH (ref 135–145)
Total Bilirubin: 0.4 mg/dL (ref 0.3–1.2)
Total Protein: 5.4 g/dL — ABNORMAL LOW (ref 6.5–8.1)

## 2020-12-28 LAB — GLUCOSE, CAPILLARY
Glucose-Capillary: 154 mg/dL — ABNORMAL HIGH (ref 70–99)
Glucose-Capillary: 219 mg/dL — ABNORMAL HIGH (ref 70–99)
Glucose-Capillary: 175 mg/dL — ABNORMAL HIGH (ref 70–99)
Glucose-Capillary: 186 mg/dL — ABNORMAL HIGH (ref 70–99)

## 2020-12-28 LAB — CSF CULTURE W GRAM STAIN
Culture: NO GROWTH
Gram Stain: NONE SEEN
Special Requests: NORMAL

## 2020-12-28 NOTE — Progress Notes (Signed)
PROGRESS NOTE        PATIENT DETAILS Name: David [name]. Age: 80 y.o. Sex: male Date of Birth: 16-May-1940 Admit Date: 12/09/2020 Admitting Physician No admitting provider for patient encounter. XNT:[id number], [name], MD  Brief Narrative: Patient is a 80 y.o. male with history of metastatic melanoma with bone/lung/brain mets-s/p brain radiation/left frontal craniotomy and tumor excision (July 2017), acute promyelocytic leukemia, hemorrhagic CVA, HTN, CAD s/p CABG 2014, AV block-s/p PPM placement, OSA on CPAP, depression who presented to [facility] with aphasia-found to have EEG evidence of seizures originating from the left temporal region-subsequently transferred to [facility] for LTM EEG.  Patient was felt to have intractable seizures-and was started on multiple antiepileptics (Onfi, Depakote, Keppra, Vimpat) subsequent hospital course complicated by encephalopathy (felt to be due to Onfi/seizure meds), fever and acute CVA. See below for further details.  Subjective: Slightly more awake than yesterday.  Opens eyes when called his name-able to squeeze my hands-withdraws when he is for testicular.  Objective: Vitals: Blood pressure (!) 183/100, pulse 83, temperature (!) 100.4 F (38 C), temperature source Axillary, resp. rate 20, weight 91.7 kg, SpO2 95 %.   Exam: Gen Exam: Lethargic-but more arousable compared to yesterday.  Not in any distress. HEENT:atraumatic, normocephalic Chest: B/L clear to auscultation anteriorly CVS:S1S2 regular Abdomen:soft non tender, non distended Extremities:no edema Neurology: Difficult exam but seems to be moving all 4 extremities. Skin: no rash   Pertinent labs/radiology: NA: 149 Creatinine: 1.87 WBC: 7.4 Plt:147  CSF: WBC 3, protein 27  Stroke work-up: 9/16>> CT head: Right cerebellar CVA 9/16>> CTA head and neck: No LVO 9/16>> Echo: EF 55-60% 9/16>> LDL: 42 9/16>> A1c: 5.8 9/18: CT head: No acute abnormality-lesion  seen on CT head on 9/16 in right cerebellar peduncle was likely artifactual.  Microbiology: 9/14>>Blood culture: 1/2-staff epidermidis 9/16>> blood culture: No growth 9/16>> CSF culture: No growth  Pathology: 9/16>>CSF cytology: No malignant cells identified.  LTM EEG overnight: No seizures  Assessment/Plan: New refractory focal seizures: No further seizures on LTM EEG (last EEG seizure on 9/15)-neurology following-remains on Vimpat and Depakote.  No longer on Onfi/Keppra.  Encephalopathy gradually improving.  Acute toxic metabolic encephalopathy: Thought to be due to to seizure medications-particularly Onfi which has a long half-life with some mild metabolic abnormalities (AKI/hypernatremia) playing a role.  Thankfully he is more awake and alert-has benzodiazepine gets washed out from his system-an AKA/hypernatremia continue to improve.    ?Acute right cerebellar CVA: CT head on 9/16 showed possible right cerebellar stroke-however repeat CT head on 9/18 did not show stroke-it is now felt that lesion on the right cerebellar lobe on 9/16 was a artifact.  CVA ruled out.  Back on aspirin-no longer on plavix.  PPM interrogation completed-no A. fib detected.   Fever on 9/13-?  Aspiration PNA: No further fever since 9/13-high suspicion for aspiration pneumonia given sedation/lethargy and fever.  No other sources of infection apparent.  Has completed a 5-day course of empiric antimicrobial therapy.  Coag negative staph bacteremia: 1/2 blood culture on 9/14 positive for staph epidermidis-repeat cultures on 9/16 (before vancomycin started) negative so far.  Although high suspicion for contamination-patient has PPM in place.  Since repeat cultures are negative-no longer on vancomycin.  AKI on CKD stage IIIb: AKI due to contrast nephropathy-improved-and back to baseline.  Avoid nephrotoxic agents.  Acute urinary retention (more than 1000  cc on bladder scan on 9/18): Foley catheter remains in place-on  Flomax-once mental status improves further-plan is to perform a voiding trial.     Hypernatremia: Secondary to dehydration (no oral intake due to encephalopathy)-continue free water flushes via PEG tube.  Sodium levels slowly improving.  Hyperglycemia: Likely due to tube feedings-no evidence of diabetes.  CBGs stabilized-continue Levemir 10 units twice daily and SSI.    Recent Labs    12/27/20 2325 12/28/20 0319 12/28/20 1256  GLUCAP 182* 154* 219*    HTN: BP relatively stable-on metoprolol-if blood pressure continues to be on the higher side-May need to resume amlodipine.  Reassess 9/21.  History of CAD s/p CABG in 2014: Remains stable-continue aspirin/beta-blocker.    History of complete heart block-PPM in place  History of hemiplegia and hemiparesis following nontraumatic intracranial hemorrhage affecting right dominant side  History of metastatic melanoma with metastasis to the lingula, right upper lung, supraventricular clavicular lymph node, left frontal lobe metastasis-s/p resection: Resume outpatient follow-up with oncology/neurooncology.    History of acute promyelocytic leukemia: Follow CBC periodically--in remission-stable for outpatient follow-up with primary oncologist.  Major depressive disorder: Trazodone/Zoloft held due to encephalopathy-resume over the next few days once mentation improves further.  Hypothyroidism: Continue levothyroxine.  Recent TSH in May was within normal limits.   OSA:on CPAP-on hold as NG tube is in place.  Nutrition: Cortak tube placed on 9/16-feedings ongoing.  Will need SLP reevaluation once encephalopathy has improved before discontinuing cortak.  Functional quadriplegia/severe debility/deconditioning: Normally able to ambulate with the help of walker-current significant debility is from acute illness.  Once encephalopathy resolves-hopeful that we can start PT/OT.  Obesity: Estimated body mass index is 30.74 kg/m as calculated from the  following:   Height as of 12/15/20: 5\' 8"  (1.727 m).   Weight as of this encounter: 91.7 kg.    Procedures : 9/16>> lumbar puncture by IR Consults:Neurology DVT Prophylaxis :Prophylactic Lovenox Code Status:DNR Family Communication:Daughter-[name] [phone] on 9/19  Disposition Plan: Status is: Inpatient  Remains inpatient appropriate because:Inpatient level of care appropriate due to severity of illness  Dispo: The patient is from: Home              Anticipated d/c is to: SNF              Patient currently is not medically stable to d/c.   Difficult to place patient No    Barriers to Discharge: Ongoing encephalopathy-intractable seizures-LTM EEG in progress-not stable for discharge.  Antimicrobial agents: Anti-infectives (From admission, onward)    Start     Dose/Rate Route Frequency Ordered Stop   12/26/20 1600  vancomycin (VANCOREADY) IVPB 1250 mg/250 mL  Status:  Discontinued        1,250 mg 166.7 mL/hr over 90 Minutes Intravenous Every 48 hours 12/24/20 1252 12/25/20 1038   12/26/20 1600  vancomycin (VANCOCIN) IVPB 1000 mg/200 mL premix  Status:  Discontinued        1,000 mg 200 mL/hr over 60 Minutes Intravenous Every 48 hours 12/25/20 1038 12/25/20 1547   12/24/20 1800  Ampicillin-Sulbactam (UNASYN) 3 g in sodium chloride 0.9 % 100 mL IVPB        3 g 200 mL/hr over 30 Minutes Intravenous Every 12 hours 12/24/20 1309 12/25/20 1754   12/24/20 1345  vancomycin (VANCOREADY) IVPB 1750 mg/350 mL        1,750 mg 175 mL/hr over 120 Minutes Intravenous  Once 12/24/20 1251 12/24/20 1548   12/22/20 1300  ampicillin-sulbactam (UNASYN)  1.5 g in sodium chloride 0.9 % 100 mL IVPB  Status:  Discontinued        1.5 g 200 mL/hr over 30 Minutes Intravenous Every 8 hours 12/22/20 1202 12/24/20 1309   12/21/20 1900  azithromycin (ZITHROMAX) 500 mg in sodium chloride 0.9 % 250 mL IVPB  Status:  Discontinued        500 mg 250 mL/hr over 60 Minutes Intravenous Every 24  hours 12/21/20 1805 12/22/20 1058   12/21/20 1730  cefTRIAXone (ROCEPHIN) 1 g in sodium chloride 0.9 % 100 mL IVPB  Status:  Discontinued        1 g 200 mL/hr over 30 Minutes Intravenous Every 24 hours 12/21/20 1633 12/22/20 1058   12/21/20 1730  azithromycin (ZITHROMAX) tablet 500 mg  Status:  Discontinued        500 mg Oral Daily 12/21/20 1633 12/21/20 1804        Time spent: 35 minutes-Greater than 50% of this time was spent in counseling, explanation of diagnosis, planning of further management, and coordination of care.  MEDICATIONS: Scheduled Meds:  allopurinol  200 mg Per Tube Daily   aspirin  81 mg Per Tube Daily   Chlorhexidine Gluconate Cloth  6 each Topical Daily   enoxaparin (LOVENOX) injection  30 mg Subcutaneous Q24H   feeding supplement (PROSource TF)  45 mL Per Tube BID   free water  450 mL Per Tube Q4H   insulin aspart  0-15 Units Subcutaneous Q4H   insulin aspart  0-5 Units Subcutaneous QHS   insulin detemir  10 Units Subcutaneous BID   levothyroxine  137 mcg Per Tube Q0600   lidocaine (PF)  5 mL Other Once   metoprolol tartrate  37.5 mg Per Tube BID   tamsulosin  0.4 mg Oral Daily   valproic acid  500 mg Per Tube BID   And   valproic acid  250 mg Per Tube Q24H   Continuous Infusions:  sodium chloride Stopped (12/28/20 0843)   feeding supplement (OSMOLITE 1.5 CAL) 1,000 mL (12/28/20 0232)   lacosamide (VIMPAT) IV Stopped (12/28/20 0913)   PRN Meds:.acetaminophen **OR** acetaminophen, albuterol, colchicine, midazolam, ondansetron **OR** ondansetron (ZOFRAN) IV   LABORATORY DATA: CBC: Recent Labs  Lab 12/24/20 0255 12/24/20 0909 12/25/20 0605 12/26/20 0740 12/27/20 0551 12/28/20 0249  WBC 6.4  --  8.4 8.1 7.2 7.4  HGB 16.8 16.3 16.6 15.5 14.6 14.8  HCT 52.4* 48.0 51.0 47.7 44.8 45.4  MCV 96.1  --  97.0 97.3 98.2 97.2  PLT 159  --  150 142* 136* 147*     Basic Metabolic Panel: Recent Labs  Lab 12/24/20 0255 12/24/20 0909 12/24/20 1650  12/25/20 0605 12/25/20 1631 12/26/20 0551 12/26/20 0740 12/27/20 0551 12/28/20 0249  NA 147* 151*  --  150*  --   --  151* 150* 149*  K 4.3 4.1  --  4.3  --   --  4.2 4.1 3.9  CL 115*  --   --  118*  --   --  123* 117* 115*  CO2 20*  --   --  17*  --   --  19* 23 25  GLUCOSE 174*  --   --  218*  --   --  194* 191* 163*  BUN 44*  --   --  54*  --   --  63* 59* 50*  CREATININE 2.14*  --   --  2.30*  --   --  2.72* 2.40*  1.87*  CALCIUM 9.1  --   --  8.5*  --   --  8.1* 8.2* 8.4*  MG  --   --  2.2 2.4 2.7* 2.8*  --   --   --   PHOS  --   --  3.5 2.6 3.2 3.5  --   --   --      GFR: Estimated Creatinine Clearance: 34 mL/min (A) (by C-G formula based on SCr of 1.87 mg/dL (H)).  Liver Function Tests: Recent Labs  Lab 12/23/20 0302 12/24/20 0255 12/26/20 0740 12/27/20 0551 12/28/20 0249  AST 41 27 18 20 20   ALT 35 26 15 17 16   ALKPHOS 44 45 41 42 42  BILITOT 0.8 0.6 0.5 0.5 0.4  PROT 6.0* 6.2* 5.3* 5.3* 5.4*  ALBUMIN 3.0* 2.9* 2.2* 2.2* 2.3*    No results for input(s): LIPASE, AMYLASE in the last 168 hours. Recent Labs  Lab 12/23/20 0917 12/25/20 0605 12/26/20 0740  AMMONIA 32 52* 42*     Coagulation Profile: No results for input(s): INR, PROTIME in the last 168 hours.   Cardiac Enzymes: No results for input(s): CKTOTAL, CKMB, CKMBINDEX, TROPONINI in the last 168 hours.  BNP (last 3 results) No results for input(s): PROBNP in the last 8760 hours.  Lipid Profile: No results for input(s): CHOL, HDL, LDLCALC, TRIG, CHOLHDL, LDLDIRECT in the last 72 hours.   Thyroid Function Tests: No results for input(s): TSH, T4TOTAL, FREET4, T3FREE, THYROIDAB in the last 72 hours.  Anemia Panel: No results for input(s): VITAMINB12, FOLATE, FERRITIN, TIBC, IRON, RETICCTPCT in the last 72 hours.  Urine analysis:    Component Value Date/Time   COLORURINE YELLOW 12/22/2020 0340   APPEARANCEUR CLEAR 12/22/2020 0340   LABSPEC 1.020 12/22/2020 0340   PHURINE 6.0 12/22/2020  0340   GLUCOSEU NEGATIVE 12/22/2020 0340   GLUCOSEU NEGATIVE 10/12/2020 1118   HGBUR LARGE (A) 12/22/2020 0340   BILIRUBINUR NEGATIVE 12/22/2020 0340   KETONESUR NEGATIVE 12/22/2020 0340   PROTEINUR 100 (A) 12/22/2020 0340   UROBILINOGEN 0.2 10/12/2020 1118   NITRITE NEGATIVE 12/22/2020 0340   LEUKOCYTESUR NEGATIVE 12/22/2020 0340    Sepsis Labs: Lactic Acid, Venous    Component Value Date/Time   LATICACIDVEN 0.8 01/03/2019 0046    MICROBIOLOGY: Recent Results (from the past 240 hour(s))  Resp Panel by RT-PCR (Flu A&B, Covid) Nasopharyngeal Swab     Status: None   Collection Time: 12/21/20  4:34 PM   Specimen: Nasopharyngeal Swab; Nasopharyngeal(NP) swabs in vial transport medium  Result Value Ref Range Status   SARS Coronavirus 2 by RT PCR NEGATIVE NEGATIVE Final    Comment: (NOTE) SARS-CoV-2 target nucleic acids are NOT DETECTED.  The SARS-CoV-2 RNA is generally detectable in upper respiratory specimens during the acute phase of infection. The lowest concentration of SARS-CoV-2 viral copies this assay can detect is 138 copies/mL. A negative result does not preclude SARS-Cov-2 infection and should not be used as the sole basis for treatment or other patient management decisions. A negative result may occur with  improper specimen collection/handling, submission of specimen other than nasopharyngeal swab, presence of viral mutation(s) within the areas targeted by this assay, and inadequate number of viral copies(<138 copies/mL). A negative result must be combined with clinical observations, patient history, and epidemiological information. The expected result is Negative.  Fact Sheet for Patients:  EntrepreneurPulse.com.au  Fact Sheet for Healthcare Providers:  IncredibleEmployment.be  This test is no t yet approved or cleared by the [place] FDA  and  has been authorized for detection and/or diagnosis of SARS-CoV-2 by FDA under  an Emergency Use Authorization (EUA). This EUA will remain  in effect (meaning this test can be used) for the duration of the COVID-19 declaration under Section 564(b)(1) of the Act, 21 U.S.C.section 360bbb-3(b)(1), unless the authorization is terminated  or revoked sooner.       Influenza A by PCR NEGATIVE NEGATIVE Final   Influenza B by PCR NEGATIVE NEGATIVE Final    Comment: (NOTE) The Xpert Xpress SARS-CoV-2/FLU/RSV plus assay is intended as an aid in the diagnosis of influenza from Nasopharyngeal swab specimens and should not be used as a sole basis for treatment. Nasal washings and aspirates are unacceptable for Xpert Xpress SARS-CoV-2/FLU/RSV testing.  Fact Sheet for Patients: EntrepreneurPulse.com.au  Fact Sheet for Healthcare Providers: IncredibleEmployment.be  This test is not yet approved or cleared by the [place] FDA and has been authorized for detection and/or diagnosis of SARS-CoV-2 by FDA under an Emergency Use Authorization (EUA). This EUA will remain in effect (meaning this test can be used) for the duration of the COVID-19 declaration under Section 564(b)(1) of the Act, 21 U.S.C. section 360bbb-3(b)(1), unless the authorization is terminated or revoked.  Performed at [facility], [place] [address], [place], [place] [address]   MRSA Next Gen by PCR, Nasal     Status: Abnormal   Collection Time: 12/21/20  4:36 PM   Specimen: Nasopharyngeal Swab; Nasal Swab  Result Value Ref Range Status   MRSA by PCR Next Gen NEGATIVE (A) NOT DETECTED Final    Comment:        The GeneXpert MRSA Assay (FDA approved for NASAL specimens only), is one component of a comprehensive MRSA colonization surveillance program. It is not intended to diagnose MRSA infection nor to guide or monitor treatment for MRSA infections. Performed at [facility], [place] [address], [place], [place] [address]   Culture, blood (routine x 2)      Status: Abnormal   Collection Time: 12/21/20  5:13 PM   Specimen: BLOOD RIGHT HAND  Result Value Ref Range Status   Specimen Description BLOOD RIGHT HAND  Final   Special Requests   Final    BOTTLES DRAWN AEROBIC ONLY Blood Culture results may not be optimal due to an inadequate volume of blood received in culture bottles   Culture  Setup Time   Final    GRAM POSITIVE COCCI IN CLUSTERS AEROBIC BOTTLE ONLY CRITICAL RESULT CALLED TO, READ BACK BY AND VERIFIED WITH: PHARMD ALEX L 1305 [id number] FCP    Culture (A)  Final    STAPHYLOCOCCUS EPIDERMIDIS THE SIGNIFICANCE OF ISOLATING THIS ORGANISM FROM A SINGLE SET OF BLOOD CULTURES WHEN MULTIPLE SETS ARE DRAWN IS UNCERTAIN. PLEASE NOTIFY THE MICROBIOLOGY DEPARTMENT WITHIN ONE WEEK IF SPECIATION AND SENSITIVITIES ARE REQUIRED. Performed at [facility], [place] [address], [place],  [address]    Report Status 12/24/2020 FINAL  Final  Blood Culture ID Panel (Reflexed)     Status: Abnormal   Collection Time: 12/21/20  5:13 PM  Result Value Ref Range Status   Enterococcus faecalis NOT DETECTED NOT DETECTED Final   Enterococcus Faecium NOT DETECTED NOT DETECTED Final   Listeria monocytogenes NOT DETECTED NOT DETECTED Final   Staphylococcus species DETECTED (A) NOT DETECTED Final    Comment: CRITICAL RESULT CALLED TO, READ BACK BY AND VERIFIED WITH: PHARMD ALEX L 1305 [id number] FCP    Staphylococcus aureus (BCID) NOT DETECTED NOT  DETECTED Final   Staphylococcus epidermidis DETECTED (A) NOT DETECTED Final    Comment: Methicillin (oxacillin) resistant coagulase negative staphylococcus. Possible blood culture contaminant (unless isolated from more than one blood culture draw or clinical case suggests pathogenicity). No antibiotic treatment is indicated for blood  culture contaminants. CRITICAL RESULT CALLED TO, READ BACK BY AND VERIFIED WITH: PHARMD ALEX L 1305 [id number] FCP    Staphylococcus lugdunensis NOT DETECTED NOT DETECTED Final    Streptococcus species NOT DETECTED NOT DETECTED Final   Streptococcus agalactiae NOT DETECTED NOT DETECTED Final   Streptococcus pneumoniae NOT DETECTED NOT DETECTED Final   Streptococcus pyogenes NOT DETECTED NOT DETECTED Final   A.calcoaceticus-baumannii NOT DETECTED NOT DETECTED Final   Bacteroides fragilis NOT DETECTED NOT DETECTED Final   Enterobacterales NOT DETECTED NOT DETECTED Final   Enterobacter cloacae complex NOT DETECTED NOT DETECTED Final   Escherichia coli NOT DETECTED NOT DETECTED Final   Klebsiella aerogenes NOT DETECTED NOT DETECTED Final   Klebsiella oxytoca NOT DETECTED NOT DETECTED Final   Klebsiella pneumoniae NOT DETECTED NOT DETECTED Final   Proteus species NOT DETECTED NOT DETECTED Final   Salmonella species NOT DETECTED NOT DETECTED Final   Serratia marcescens NOT DETECTED NOT DETECTED Final   Haemophilus influenzae NOT DETECTED NOT DETECTED Final   Neisseria meningitidis NOT DETECTED NOT DETECTED Final   Pseudomonas aeruginosa NOT DETECTED NOT DETECTED Final   Stenotrophomonas maltophilia NOT DETECTED NOT DETECTED Final   Candida albicans NOT DETECTED NOT DETECTED Final   Candida auris NOT DETECTED NOT DETECTED Final   Candida glabrata NOT DETECTED NOT DETECTED Final   Candida krusei NOT DETECTED NOT DETECTED Final   Candida parapsilosis NOT DETECTED NOT DETECTED Final   Candida tropicalis NOT DETECTED NOT DETECTED Final   Cryptococcus neoformans/gattii NOT DETECTED NOT DETECTED Final   Methicillin resistance mecA/C DETECTED (A) NOT DETECTED Final    Comment: CRITICAL RESULT CALLED TO, READ BACK BY AND VERIFIED WITH: PHARMD ALEX L 3338 [id number] FCP Performed at [facility], [address] [place], [place] [address]   Culture, blood (routine x 2)     Status: None   Collection Time: 12/21/20  5:45 PM   Specimen: BLOOD RIGHT ARM  Result Value Ref Range Status   Specimen Description BLOOD RIGHT ARM  Final   Special Requests   Final    BOTTLES DRAWN  AEROBIC ONLY Blood Culture adequate volume   Culture   Final    NO GROWTH 5 DAYS Performed at [facility], [place] [address] [place], [place] [address]    Report Status 12/26/2020 FINAL  Final  Urine Culture     Status: Abnormal   Collection Time: 12/22/20  3:40 AM   Specimen: Urine, Clean Catch  Result Value Ref Range Status   Specimen Description URINE, CLEAN CATCH  Final   Special Requests NONE  Final   Culture (A)  Final    <10,000 COLONIES/mL INSIGNIFICANT GROWTH Performed at [facility], [place] [address] [place], [place] [address]    Report Status 12/23/2020 FINAL  Final  Culture, blood (routine x 2)     Status: None (Preliminary result)   Collection Time: 12/24/20 11:39 AM   Specimen: BLOOD LEFT HAND  Result Value Ref Range Status   Specimen Description BLOOD LEFT HAND  Final   Special Requests BOTTLES DRAWN AEROBIC ONLY  Final   Culture   Final    NO GROWTH 4 DAYS Performed at [facility], [address], [place], [place] [address]    Report Status PENDING  Incomplete  Culture, blood (routine x 2)     Status: None (Preliminary result)   Collection Time: 12/24/20 11:49 AM   Specimen: BLOOD RIGHT HAND  Result Value Ref Range Status   Specimen Description BLOOD RIGHT HAND  Final   Special Requests BOTTLES DRAWN AEROBIC ONLY  Final   Culture   Final    NO GROWTH 4 DAYS Performed at [facility], [place] [address] [place], [place] [address]    Report Status PENDING  Incomplete  CSF culture w Gram Stain     Status: None   Collection Time: 12/24/20  6:19 PM   Specimen: CSF; Cerebrospinal Fluid  Result Value Ref Range Status   Specimen Description CSF  Final   Special Requests Normal  Final   Gram Stain NO ORGANISMS SEEN NO WBC SEEN CYTOSPIN SMEAR   Final   Culture   Final    NO GROWTH 3 DAYS Performed at [facility], [place] [address], [place], [place] [address]    Report Status 12/28/2020 FINAL  Final    RADIOLOGY  STUDIES/RESULTS: No results found.   LOS: 10 days   Oren Binet, MD  [facility]    To contact the attending provider between 7A-7P or the covering provider during after hours 7P-7A, please log into the web site [email or web address] and access using universal [place] password for that web site. If you do not have the password, please call the hospital operator.  12/28/2020, 2:44 PM

## 2020-12-28 NOTE — Progress Notes (Signed)
Unable to place pt on CPAP due to pt continues on cont EGG.  No distress noted at this time

## 2020-12-28 NOTE — Progress Notes (Signed)
PT Cancellation Note  Patient Details Name: David [name]. MRN: [id number] DOB: 11-10-1940   Cancelled Treatment:    Reason Eval/Treat Not Completed: Fatigue/lethargy limiting ability to participate. Received a note from the care team asking for PT to re-assess for appropriateness for CIR. Pt very lethargic at time of attempt and would not rouse to voice, sternal rub, or noxious stimuli to toes. At this time, SNF remains appropriate however will continue to assess for most appropriate d/c venue.    Thelma Comp 12/28/2020, 3:18 PM  Rolinda Roan, PT, DPT Acute Rehabilitation Services Pager: [phone] Office: [phone]

## 2020-12-28 NOTE — Procedures (Addendum)
Patient Name: David [name].  MRN: [id number]  Epilepsy Attending: Lora Havens  Referring Physician/Provider: Dr Zeb Comfort Duration: 12/27/2020 1133 to 12/28/2020 1133   Patient history: 80 year old male with history of CVA, ICH, melanoma with mets to lung and brain who presented with aphasia.  EEG to evaluate for seizures.   Level of alertness: lethargic   AEDs during EEG study: VPA, Vimpat   Technical aspects: This EEG study was done with scalp electrodes positioned according to the 10-20 International system of electrode placement. Electrical activity was acquired at a sampling rate of 500Hz  and reviewed with a high frequency filter of 70Hz  and a low frequency filter of 1Hz . EEG data were recorded continuously and digitally stored.    Description: EEG showed continuous generalized 3-5hz  theta-delta slowing. There was also 3 to 5 Hz sharply contoured theta-delta slowing with overriding 15 to 18 Hz beta activity in left frontal region consistent with prior craniotomy.  Sharp waves were seen in left frontal region.      ABNORMALITY -Sharp wave, left frontal region -Breach artifact, left frontal region   IMPRESSION: This study showed  evidence of epileptogenicity and cortical dysfunction in left frontal region consistent with prior craniotomy. No definite seizures were seen during this study.      Hardy Harcum [name]

## 2020-12-28 NOTE — Progress Notes (Signed)
Subjective: David [name].   ROS: Unable to obtain due to poor mental status  Examination  Vital signs in last 24 hours: Temp:  [97.8 F (36.6 C)-100.4 F (38 C)] 100.4 F (38 C) (09/20 1114) Pulse Rate:  [83-94] 83 (09/20 1114) Resp:  [18-20] 20 (09/20 1114) BP: (152-183)/(95-104) 183/100 (09/20 1114) SpO2:  [95 %-97 %] 95 % (09/20 1114)  General: lying in bed, NAD CVS: pulse-normal rate and rhythm RS: breathing comfortably, coarse breath sounds bilaterally Extremities: normal, warm Neuro: Opens eyes to verbal stimulation, follows simple commands like squeezing hand, nodded appropriately when I said his name, didn't nod for incorrect names,  did not answer rest of the orientation questions, cranial nerves II to XII grossly intact, right hemiparesis, antigravity strength in left upper and lower extremity    Basic Metabolic Panel: Recent Labs  Lab 12/24/20 0255 12/24/20 0909 12/24/20 1650 12/25/20 0605 12/25/20 1631 12/26/20 0551 12/26/20 0740 12/27/20 0551 12/28/20 0249  NA 147* 151*  --  150*  --   --  151* 150* 149*  K 4.3 4.1  --  4.3  --   --  4.2 4.1 3.9  CL 115*  --   --  118*  --   --  123* 117* 115*  CO2 20*  --   --  17*  --   --  19* 23 25  GLUCOSE 174*  --   --  218*  --   --  194* 191* 163*  BUN 44*  --   --  54*  --   --  63* 59* 50*  CREATININE 2.14*  --   --  2.30*  --   --  2.72* 2.40* 1.87*  CALCIUM 9.1  --   --  8.5*  --   --  8.1* 8.2* 8.4*  MG  --   --  2.2 2.4 2.7* 2.8*  --   --   --   PHOS  --   --  3.5 2.6 3.2 3.5  --   --   --     CBC: Recent Labs  Lab 12/24/20 0255 12/24/20 0909 12/25/20 0605 12/26/20 0740 12/27/20 0551 12/28/20 0249  WBC 6.4  --  8.4 8.1 7.2 7.4  HGB 16.8 16.3 16.6 15.5 14.6 14.8  HCT 52.4* 48.0 51.0 47.7 44.8 45.4  MCV 96.1  --  97.0 97.3 98.2 97.2  PLT 159  --  150 142* 136* 147*     Coagulation Studies: No results for input(s): LABPROT, INR in the last 72 hours.  Imaging No new brain imaging  overnight  ASSESSMENT AND PLAN: 80 year old male with prior history of melanoma with mets to left frontal lobe complicated by hemorrhage in 2017 status postresection, bone mets, coronary artery disease, CKD, depression, anxiety, hypertension and hyperlipidemia who presented with progressive aphasia and confusion.  EEG showed seizures arising from left central temporal region which has been difficult to control with escalating doses of AEDs.   Focal seizures with alteration of awareness ( improved) Metastatic melanoma with brain mets status post resection and radiation Acute toxic-metabolic encephalopathy (improving) Right hemiparesis (POA) -No seizures overnight, last seizure was on 12/23/2020, lasted about 15 seconds -Encephalopathy likely due to AEDs (Onfi), AKI   Recommendations -Continue Depakote 500 mg twice daily and 250 mg in the afternoon, Vimpat 150 mg twice daily -We will discontinue video EEG tomorrow if patient continues to be more awake and remains seizure-free -If patient has sz recurrence, will increase Vimpat to 200 mg  twice daily -Continue seizure precautions -Continue delirium precautions, aspiration precautions - as needed IV Versed 2 mg for clinical seizure-like activity lasting more than 15 minutes -Management of rest of comorbidities per primary team -Discussed my plan daughter via message   I have spent a total of  28 minutes with the patient reviewing hospital notes,  test results, labs and examining the patient as well as establishing an assessment and plan.  > 50% of time was spent in direct patient care.      Zeb Comfort Epilepsy [facility] For questions after 5pm please refer to AMION to reach the Neurologist on call

## 2020-12-29 ENCOUNTER — Inpatient Hospital Stay (HOSPITAL_COMMUNITY): Payer: Medicare Other

## 2020-12-29 DIAGNOSIS — G4733 Obstructive sleep apnea (adult) (pediatric): Secondary | ICD-10-CM

## 2020-12-29 DIAGNOSIS — N1832 Chronic kidney disease, stage 3b: Secondary | ICD-10-CM

## 2020-12-29 DIAGNOSIS — Z9989 Dependence on other enabling machines and devices: Secondary | ICD-10-CM

## 2020-12-29 LAB — CULTURE, BLOOD (ROUTINE X 2)
Culture: NO GROWTH
Culture: NO GROWTH

## 2020-12-29 LAB — GLUCOSE, CAPILLARY
Glucose-Capillary: 163 mg/dL — ABNORMAL HIGH (ref 70–99)
Glucose-Capillary: 174 mg/dL — ABNORMAL HIGH (ref 70–99)
Glucose-Capillary: 178 mg/dL — ABNORMAL HIGH (ref 70–99)
Glucose-Capillary: 180 mg/dL — ABNORMAL HIGH (ref 70–99)
Glucose-Capillary: 192 mg/dL — ABNORMAL HIGH (ref 70–99)
Glucose-Capillary: 199 mg/dL — ABNORMAL HIGH (ref 70–99)
Glucose-Capillary: 203 mg/dL — ABNORMAL HIGH (ref 70–99)
Glucose-Capillary: 253 mg/dL — ABNORMAL HIGH (ref 70–99)

## 2020-12-29 LAB — PROCALCITONIN: Procalcitonin: 0.13 ng/mL

## 2020-12-29 LAB — COMPREHENSIVE METABOLIC PANEL
ALT: 14 U/L (ref 0–44)
AST: 20 U/L (ref 15–41)
Albumin: 2.2 g/dL — ABNORMAL LOW (ref 3.5–5.0)
Alkaline Phosphatase: 40 U/L (ref 38–126)
Anion gap: 9 (ref 5–15)
BUN: 50 mg/dL — ABNORMAL HIGH (ref 8–23)
CO2: 26 mmol/L (ref 22–32)
Calcium: 8.3 mg/dL — ABNORMAL LOW (ref 8.9–10.3)
Chloride: 110 mmol/L (ref 98–111)
Creatinine, Ser: 1.83 mg/dL — ABNORMAL HIGH (ref 0.61–1.24)
GFR, Estimated: 37 mL/min — ABNORMAL LOW (ref >60)
Glucose, Bld: 191 mg/dL — ABNORMAL HIGH (ref 70–99)
Potassium: 4.5 mmol/L (ref 3.5–5.1)
Sodium: 145 mmol/L (ref 135–145)
Total Bilirubin: 0.3 mg/dL (ref 0.3–1.2)
Total Protein: 5.6 g/dL — ABNORMAL LOW (ref 6.5–8.1)

## 2020-12-29 LAB — AMMONIA: Ammonia: 45 umol/L — ABNORMAL HIGH (ref 9–35)

## 2020-12-29 LAB — URINALYSIS, MICROSCOPIC (REFLEX)
Bacteria, UA: NONE SEEN
RBC / HPF: >50 RBC/hpf (ref 0–5)

## 2020-12-29 LAB — URINALYSIS, ROUTINE W REFLEX MICROSCOPIC
Bilirubin Urine: NEGATIVE
Glucose, UA: NEGATIVE mg/dL
Ketones, ur: NEGATIVE mg/dL
Leukocytes,Ua: NEGATIVE
Nitrite: NEGATIVE
Protein, ur: >300 mg/dL — AB
Specific Gravity, Urine: 1.02 (ref 1.005–1.030)
pH: 6.5 (ref 5.0–8.0)

## 2020-12-29 LAB — CBC
HCT: 45.4 % (ref 39.0–52.0)
Hemoglobin: 15 g/dL (ref 13.0–17.0)
MCH: 31.6 pg (ref 26.0–34.0)
MCHC: 33 g/dL (ref 30.0–36.0)
MCV: 95.6 fL (ref 80.0–100.0)
Platelets: 164 10*3/uL (ref 150–400)
RBC: 4.75 MIL/uL (ref 4.22–5.81)
RDW: 13.9 % (ref 11.5–15.5)
WBC: 8.6 10*3/uL (ref 4.0–10.5)
nRBC: 0 % (ref 0.0–0.2)

## 2020-12-29 LAB — RESP PANEL BY RT-PCR (FLU A&B, COVID) ARPGX2
Influenza A by PCR: NEGATIVE
Influenza B by PCR: NEGATIVE
SARS Coronavirus 2 by RT PCR: NEGATIVE

## 2020-12-29 LAB — VALPROIC ACID LEVEL: Valproic Acid Lvl: 38 ug/mL — ABNORMAL LOW (ref 50.0–100.0)

## 2020-12-29 MED ORDER — LACTULOSE 10 GM/15ML PO SOLN
30.0000 g | Freq: Every day | ORAL | Status: DC
  Administered 2020-12-30: 30 g
  Filled 2020-12-29: qty 60

## 2020-12-29 MED ORDER — IOHEXOL 9 MG/ML PO SOLN
ORAL | Status: AC
  Administered 2020-12-29: 500 mL
  Filled 2020-12-29: qty 1000

## 2020-12-29 MED ORDER — VANCOMYCIN HCL 1750 MG/350ML IV SOLN
1750.0000 mg | Freq: Once | INTRAVENOUS | Status: AC
  Administered 2020-12-29: 1750 mg via INTRAVENOUS
  Filled 2020-12-29: qty 350

## 2020-12-29 MED ORDER — PIPERACILLIN-TAZOBACTAM 3.375 G IVPB
3.3750 g | Freq: Three times a day (TID) | INTRAVENOUS | Status: DC
  Administered 2020-12-29 – 2020-12-30 (×4): 3.375 g via INTRAVENOUS
  Filled 2020-12-29 (×4): qty 50

## 2020-12-29 MED ORDER — VANCOMYCIN HCL 1750 MG/350ML IV SOLN
1750.0000 mg | Freq: Once | INTRAVENOUS | Status: DC
  Filled 2020-12-29: qty 350

## 2020-12-29 MED ORDER — LACTULOSE 10 GM/15ML PO SOLN
30.0000 g | Freq: Two times a day (BID) | ORAL | Status: AC
  Administered 2020-12-29 (×2): 30 g
  Filled 2020-12-29 (×2): qty 60

## 2020-12-29 MED ORDER — FREE WATER
300.0000 mL | Status: DC
  Administered 2020-12-29 – 2020-12-30 (×6): 300 mL

## 2020-12-29 NOTE — Progress Notes (Signed)
   12/29/20 0416  Notify: Provider  Provider Name/Title Dr Hal Hope  Date Provider Notified 12/29/20  Time Provider Notified [phone]  Notification Type Page  Notification Reason Other (Comment) (Pt's temp is 101.1)  Provider response No new orders (Advised to give PRN Tylenol)  Date of Provider Response 12/29/20  Time of Provider Response [phone]

## 2020-12-29 NOTE — Progress Notes (Signed)
LTM EEG discontinued - skin redness at electrode site Fz. No other new skin breakdown seen

## 2020-12-29 NOTE — Progress Notes (Signed)
Neurology Progress Note  S: Patient is unable to participate in ROS due to mental status.   O: Current vital signs: BP (!) 142/96 (BP Location: Left Arm)   Pulse (!) 101   Temp 98.9 F (37.2 C) (Axillary)   Resp (!) 22   Wt 91.7 kg   SpO2 94%   BMI 30.74 kg/m  Vital signs in last 24 hours: Temp:  [98.9 F (37.2 C)-101.1 F (38.4 C)] 98.9 F (37.2 C) (09/21 0600) Pulse Rate:  [83-101] 101 (09/21 0600) Resp:  [20-25] 22 (09/21 0600) BP: (142-183)/(90-109) 142/96 (09/21 0600) SpO2:  [92 %-95 %] 94 % (09/21 0600)  GENERAL: Poor appearing elderly male. NAD. HEENT: Normocephalic and atraumatic. LUNGS: Normal respiratory effort.  CV: RRR on tele.  Ext: warm.  NEURO:  Opens eyes briefly to loud name calling and to sternal rub. He grimaces to sternal rub. No movement of extremities to noxious stimuli, but he moans and grimaces. He is not following commands nor participating in exam.  Medications  Current Facility-Administered Medications:    0.9 %  sodium chloride infusion, , Intravenous, Continuous, Ghimire, Henreitta Leber, MD, Stopped at 12/28/20 [phone]   acetaminophen (TYLENOL) tablet 650 mg, 650 mg, Per Tube, Q6H PRN, 650 mg at 12/29/20 0425 **OR** acetaminophen (TYLENOL) suppository 650 mg, 650 mg, Rectal, Q6H PRN, Ghimire, Henreitta Leber, MD   albuterol (PROVENTIL) (2.5 MG/3ML) 0.083% nebulizer solution 2.5 mg, 2.5 mg, Nebulization, Q6H PRN, Regalado, Belkys A, MD, 2.5 mg at 12/21/20 1710   allopurinol (ZYLOPRIM) tablet 200 mg, 200 mg, Per Tube, Daily, Ghimire, Shanker M, MD, 200 mg at 12/28/20 1022   aspirin chewable tablet 81 mg, 81 mg, Per Tube, Daily, Ghimire, Henreitta Leber, MD, 81 mg at 12/28/20 1022   Chlorhexidine Gluconate Cloth 2 % PADS 6 each, 6 each, Topical, Daily, Ghimire, Shanker M, MD, 6 each at 12/28/20 1023   colchicine tablet 0.6 mg, 0.6 mg, Per Tube, Daily PRN, Ghimire, Henreitta Leber, MD   enoxaparin (LOVENOX) injection 30 mg, 30 mg, Subcutaneous, Q24H, Yadav, Priyanka O, MD,  30 mg at 12/28/20 2250   feeding supplement (OSMOLITE 1.5 CAL) liquid 1,000 mL, 1,000 mL, Per Tube, Continuous, Ghimire, Henreitta Leber, MD, Last Rate: 60 mL/hr at 12/28/20 2046, 1,000 mL at 12/28/20 2046   feeding supplement (PROSource TF) liquid 45 mL, 45 mL, Per Tube, BID, Ghimire, Shanker M, MD, 45 mL at 12/28/20 2254   free water 300 mL, 300 mL, Per Tube, Q4H, Ghimire, Shanker M, MD   insulin aspart (novoLOG) injection 0-15 Units, 0-15 Units, Subcutaneous, Q4H, Crosley, Debby, MD, 3 Units at 12/29/20 0459   insulin aspart (novoLOG) injection 0-5 Units, 0-5 Units, Subcutaneous, QHS, Crosley, Debby, MD   insulin detemir (LEVEMIR) injection 10 Units, 10 Units, Subcutaneous, BID, Ghimire, Henreitta Leber, MD, 10 Units at 12/28/20 2300   lacosamide (VIMPAT) 150 mg in sodium chloride 0.9 % 25 mL IVPB, 150 mg, Intravenous, Q12H, Yadav, Priyanka O, MD, Last Rate: 80 mL/hr at 12/28/20 2251, 150 mg at 12/28/20 2251   levothyroxine (SYNTHROID) tablet 137 mcg, 137 mcg, Per Tube, Q0600, Jonetta Osgood, MD, 137 mcg at 12/29/20 0602   lidocaine (PF) (XYLOCAINE) 1 % injection 5 mL, 5 mL, Other, Once, Lora Havens, MD   metoprolol tartrate (LOPRESSOR) 25 mg/10 mL oral suspension 37.5 mg, 37.5 mg, Per Tube, BID, Ghimire, Shanker M, MD, 37.5 mg at 12/28/20 2254   midazolam (VERSED) injection 2 mg, 2 mg, Intravenous, Q4H PRN, Lora Havens, MD  ondansetron (ZOFRAN) tablet 4 mg, 4 mg, Per Tube, Q6H PRN **OR** ondansetron (ZOFRAN) injection 4 mg, 4 mg, Intravenous, Q6H PRN, Ghimire, Henreitta Leber, MD   tamsulosin (FLOMAX) capsule 0.4 mg, 0.4 mg, Oral, Daily, Ghimire, Shanker M, MD, 0.4 mg at 12/27/20 0857   valproic acid (DEPAKENE) 250 MG/5ML solution 500 mg, 500 mg, Per Tube, BID, 500 mg at 12/28/20 2254 **AND** valproic acid (DEPAKENE) 250 MG/5ML solution 250 mg, 250 mg, Per Tube, Q24H, Yadav, Priyanka O, MD, 250 mg at 12/28/20 1452  Facility-Administered Medications Ordered in Other Encounters:    0.9 %  sodium  chloride infusion, 1,000 mL, Intravenous, Once, [name], MD  Pertinent Labs Creat 1.83 (2.72).    Glucose 191 stable.    Ammonia 37 (23, 26, 36).  No new imaging.  EEG 12/28/20 read.  This study showed  evidence of epileptogenicity and cortical dysfunction in left frontal region consistent with prior craniotomy. No definite seizures were seen during this study.    Assessment:  80 year old male with prior history of melanoma with mets to left frontal lobe complicated by hemorrhage in 2017 status postresection with bone mets who presented 11 days ago with  progressive aphasia and confusion.  Last EEG read 12/28/20 negative for seizures. Exam by colleague had improved yesterday, but back to only moaning to noxious stimuli today. ? Increased Ammonia level. ?  Side effect of long-acting benzodiazepine in the setting of depressed renal function.  Impression: -Focal seizures left temporal lobe with alteration of awareness.  -Metastatic melanoma with brain metastases s/p resection and radiation.  -acute toxic-metabolic encephalopathy, exam waxes and wanes.  -Ammonia trending upwards.   Recommendations/Plan:  -Continue Depakote 500 mg twice daily and 250 mg in the afternoon.  - Vimpat 150 mg twice daily. If sz recurrence, can go up to 200mg  po bid.  -d/c LTM EEG.  -Continue seizure precautions -Continue delirium precautions, aspiration precautions - as needed IV Versed 2 mg for clinical seizure-like activity lasting more than 15 minutes -Lactulose started by medical team for hyperammonia.  -Check Depakote level -Check Onfi level-send out test ordered. -Management of rest of comorbidities per primary team   Pt seen by Clance Boll, MSN, APN-BC/Nurse Practitioner/Neuro and later by MD. Note and plan to be edited as needed by MD.  Pager: [phone]   Attending Neurohospitalist Addendum Patient seen and examined with APP/Resident. Agree with the history and physical as documented  above. Agree with the plan as documented, which I helped formulate. I have independently reviewed the chart, obtained history, review of systems and examined the patient.I have personally reviewed pertinent head/neck/spine imaging (CT/MRI). Discussed plan with the primary hospitalist Dr.Ghimire and patient's daughter, Dr. [name] over the phone. Please feel free to call with any questions.  -- Amie Portland, MD Neurologist [facility] Pager: [phone]

## 2020-12-29 NOTE — Procedures (Addendum)
Patient Name: David [name].  MRN: [id number]  Epilepsy Attending: Lora Havens  Referring Physician/Provider: Dr Zeb Comfort Duration: 12/28/2020 1133 to 12/29/2020 1015   Patient history: 80 year old male with history of CVA, ICH, melanoma with mets to lung and brain who presented with aphasia.  EEG to evaluate for seizures.   Level of alertness: lethargic   AEDs during EEG study: VPA, Vimpat   Technical aspects: This EEG study was done with scalp electrodes positioned according to the 10-20 International system of electrode placement. Electrical activity was acquired at a sampling rate of 500Hz  and reviewed with a high frequency filter of 70Hz  and a low frequency filter of 1Hz . EEG data were recorded continuously and digitally stored.    Description: EEG showed continuous generalized 3-5hz  theta-delta slowing. There was also 3 to 5 Hz sharply contoured theta-delta slowing with overriding 15 to 18 Hz beta activity in left frontal region consistent with prior craniotomy.  Sharp waves were seen in left frontal region.      ABNORMALITY -Sharp wave, left frontal region -Breach artifact, left frontal region   IMPRESSION: This study showed  evidence of epileptogenicity and cortical dysfunction in left frontal region consistent with prior craniotomy. No definite seizures were seen during this study.      Clementine Soulliere [name]

## 2020-12-29 NOTE — Progress Notes (Addendum)
PROGRESS NOTE        PATIENT DETAILS Name: David [name]. Age: 80 y.o. Sex: male Date of Birth: 12/17/1940 Admit Date: 12/12/2020 Admitting Physician No admitting provider for patient encounter. NOM:[email or web address], David Hatchet, MD  Brief Narrative: Patient is a 80 y.o. male with history of metastatic melanoma with bone/lung/brain mets-s/p brain radiation/left frontal craniotomy and tumor excision (July 2017), acute promyelocytic leukemia, hemorrhagic CVA, HTN, CAD s/p CABG 2014, AV block-s/p PPM placement, OSA on CPAP, depression who presented to [facility] with aphasia-found to have EEG evidence of seizures originating from the left temporal region-subsequently transferred to [facility] for LTM EEG.  Patient was felt to have intractable seizures-and was started on multiple antiepileptics (Onfi, Depakote, Keppra, Vimpat) subsequent hospital course complicated by encephalopathy (felt to be due to Onfi/seizure meds/AKI ) and Fever.  Subjective: Essentially unchanged-opens eyes briefly to loud verbal stimuli-able to squeeze my fingers when asked.  Attempts to talk-but then quickly goes back to sleep.  Objective: Vitals: Blood pressure (!) 157/95, pulse (!) 104, temperature 100.3 F (37.9 C), temperature source Axillary, resp. rate 20, weight 91.7 kg, SpO2 93 %.   Exam: Gen Exam: Lethargic-only responding to loud verbal stimuli HEENT:atraumatic, normocephalic Chest: B/L clear to auscultation anteriorly CVS:S1S2 regular Abdomen:soft non tender, non distended Extremities:no edema Neurology: Difficult exam but seems to withdraw all 4 extremities to pain. Skin: no rash   Pertinent labs/radiology: NA: 145 Creatinine:1.83 WBC: 8.6 Plt:164  CSF: WBC 3, protein 27  Stroke work-up: 9/16>> CT head: Right cerebellar CVA 9/16>> CTA head and neck: No LVO 9/16>> Echo: EF 55-60% 9/16>> LDL: 42 9/16>> A1c: 5.8 9/18: CT head: No acute abnormality-lesion seen on CT head on 9/16 in  right cerebellar peduncle was likely artifactual.  Microbiology: 9/14>>Blood culture: 1/2-staff epidermidis 9/16>> blood culture: No growth 9/16>> CSF culture: No growth 9/16>>CSF HSV/Crypto Ag:neg 9/21>> blood culture: Pending 9/21>> urine culture: Pending 9/21>> COVID PCR: Pending.  Pathology: 9/16>>CSF cytology: No malignant cells identified.  LTM EEG overnight: No seizures  Assessment/Plan: New refractory focal seizures: No further seizures on LTM EEG (last EEG seizure on 9/15)-neurology following-remains on Vimpat and Depakote.  No longer on Onfi/Keppra.    Acute toxic metabolic encephalopathy: Thought to be due to to seizure medications-particularly Onfi which has a long half-life with some mild metabolic abnormalities (AKI/hypernatremia) playing a role.  Thankfully he is a bit more awake and alert compared to last week-but still [name] lethargic/encephalopathic.  Hopeful that with time-once benzodiazepine is washed out from his system-he would improve further.  AKI/hyponatremia has resolved-renal function is back to baseline.Neurology has sent out Onfi level-will need to follow.  ?Acute right cerebellar CVA: CT head on 9/16 showed possible right cerebellar stroke-however repeat CT head on 9/18 did not show stroke-it is now felt that lesion on the right cerebellar lobe on 9/16 was a artifact.  CVA ruled out.  Back on aspirin-no longer on plavix.  PPM interrogation completed-no A. fib detected.   Fever on 9/13-?  Aspiration PNA-fever reoccurred on 9/20: Had fever on 9/13-felt to be due to aspiration pneumonia (even though CXR negative)-successfully treated with Unasyn.  However on 9/20-started to spike fevers again-suspect that this is again aspiration in the setting of severe lethargy/encephalopathy.  Repeat chest x-ray on 9/21 negative for obvious infiltrates.  Awaiting cultures/COVID PCR.  Have ordered CT abdomen/chest to see if there is any  other infectious foci.  May need to be  started on empiric antimicrobial therapy once imaging/cultures have been obtained.  Coag negative staph bacteremia: 1/2 blood culture on 9/14 positive for staph epidermidis-repeat cultures on 9/16 (before vancomycin started) negative so far.  Suspect this was a contamination.  Since she started having fevers again on 9/20-blood cultures repeated-awaiting results.  AKI on CKD stage IIIb: AKI due to contrast nephropathy-improved-and back to baseline.  Avoid nephrotoxic agents.Per daughter-Dr Leggett-patient has thin basement membrane disease and has chronic microscopic hematuria  Acute urinary retention (more than 1000 cc on bladder scan on 9/18): Foley catheter remains in place-on Flomax.  Since starting to spike temperatures-with some concern for UTI-we will attempt a voiding trial today.  Renal function is back to baseline.  Hypernatremia: Secondary to dehydration (no oral intake due to encephalopathy)-continue free water flushes via PEG tube.  Sodium levels have decreased-and has now normalized.  Hyperglycemia: Likely due to tube feedings-no evidence of diabetes.  CBGs stabilized-continue Levemir 10 units twice daily and SSI.    Recent Labs    12/29/20 0454 12/29/20 0830 12/29/20 1221  GLUCAP 178* 174* 199*    HTN: BP relatively stable-on metoprolol-if blood pressure continues to be on the higher side-May need to resume amlodipine.  Reassess 9/21.  History of CAD s/p CABG in 2014: Remains stable-continue aspirin/beta-blocker.    History of complete heart block-PPM in place  History of hemiplegia and hemiparesis following nontraumatic intracranial hemorrhage affecting right dominant side  History of metastatic melanoma with metastasis to the lingula, right upper lung, supraventricular clavicular lymph node, left frontal lobe metastasis-s/p resection: Resume outpatient follow-up with oncology/neurooncology.    History of acute promyelocytic leukemia: Follow CBC periodically--in  remission-stable for outpatient follow-up with primary oncologist.  Major depressive disorder: Trazodone/Zoloft held due to encephalopathy-resume over the next few days once mentation improves further.  Hypothyroidism: Continue levothyroxine.  Recent TSH in May was within normal limits.   OSA:on CPAP-on hold as NG tube is in place.  Nutrition: Cortak tube placed on 9/16-feedings ongoing.  Will need SLP reevaluation once encephalopathy has improved before discontinuing cortak.  Functional quadriplegia/severe debility/deconditioning: Normally able to ambulate with the help of walker-current significant debility is from acute illness.  Once encephalopathy resolves-hopeful that we can start PT/OT.  Obesity: Estimated body mass index is 30.74 kg/m as calculated from the following:   Height as of 12/15/20: 5\' 8"  (1.727 m).   Weight as of this encounter: 91.7 kg.    Procedures : 9/16>> lumbar puncture by IR Consults:Neurology DVT Prophylaxis :Prophylactic Lovenox Code Status:DNR Family Communication:Daughter-[name] [phone] on 9/21  Disposition Plan: Status is: Inpatient  Remains inpatient appropriate because:Inpatient level of care appropriate due to severity of illness  Dispo: The patient is from: Home              Anticipated d/c is to: SNF              Patient currently is not medically stable to d/c.   Difficult to place patient No    Barriers to Discharge: Ongoing encephalopathy-intractable seizures-LTM EEG in progress-not stable for discharge.  Antimicrobial agents: Anti-infectives (From admission, onward)    Start     Dose/Rate Route Frequency Ordered Stop   12/26/20 1600  vancomycin (VANCOREADY) IVPB 1250 mg/250 mL  Status:  Discontinued        1,250 mg 166.7 mL/hr over 90 Minutes Intravenous Every 48 hours 12/24/20 1252 12/25/20 1038   12/26/20 1600  vancomycin (VANCOCIN)  IVPB 1000 mg/200 mL premix  Status:  Discontinued        1,000 mg 200 mL/hr  over 60 Minutes Intravenous Every 48 hours 12/25/20 1038 12/25/20 1547   12/24/20 1800  Ampicillin-Sulbactam (UNASYN) 3 g in sodium chloride 0.9 % 100 mL IVPB        3 g 200 mL/hr over 30 Minutes Intravenous Every 12 hours 12/24/20 1309 12/25/20 1754   12/24/20 1345  vancomycin (VANCOREADY) IVPB 1750 mg/350 mL        1,750 mg 175 mL/hr over 120 Minutes Intravenous  Once 12/24/20 1251 12/24/20 1548   12/22/20 1300  ampicillin-sulbactam (UNASYN) 1.5 g in sodium chloride 0.9 % 100 mL IVPB  Status:  Discontinued        1.5 g 200 mL/hr over 30 Minutes Intravenous Every 8 hours 12/22/20 1202 12/24/20 1309   12/21/20 1900  azithromycin (ZITHROMAX) 500 mg in sodium chloride 0.9 % 250 mL IVPB  Status:  Discontinued        500 mg 250 mL/hr over 60 Minutes Intravenous Every 24 hours 12/21/20 1805 12/22/20 1058   12/21/20 1730  cefTRIAXone (ROCEPHIN) 1 g in sodium chloride 0.9 % 100 mL IVPB  Status:  Discontinued        1 g 200 mL/hr over 30 Minutes Intravenous Every 24 hours 12/21/20 1633 12/22/20 1058   12/21/20 1730  azithromycin (ZITHROMAX) tablet 500 mg  Status:  Discontinued        500 mg Oral Daily 12/21/20 1633 12/21/20 1804        Time spent: 35 minutes-Greater than 50% of this time was spent in counseling, explanation of diagnosis, planning of further management, and coordination of care.  MEDICATIONS: Scheduled Meds:  allopurinol  200 mg Per Tube Daily   aspirin  81 mg Per Tube Daily   Chlorhexidine Gluconate Cloth  6 each Topical Daily   enoxaparin (LOVENOX) injection  30 mg Subcutaneous Q24H   feeding supplement (PROSource TF)  45 mL Per Tube BID   free water  300 mL Per Tube Q4H   insulin aspart  0-15 Units Subcutaneous Q4H   insulin aspart  0-5 Units Subcutaneous QHS   insulin detemir  10 Units Subcutaneous BID   iohexol       lactulose  30 g Per Tube BID   [START ON 12/30/2020] lactulose  30 g Per Tube Daily   levothyroxine  137 mcg Per Tube Q0600   lidocaine (PF)  5 mL  Other Once   metoprolol tartrate  37.5 mg Per Tube BID   tamsulosin  0.4 mg Oral Daily   valproic acid  500 mg Per Tube BID   And   valproic acid  250 mg Per Tube Q24H   Continuous Infusions:  sodium chloride Stopped (12/28/20 0843)   feeding supplement (OSMOLITE 1.5 CAL) 1,000 mL (12/28/20 2046)   lacosamide (VIMPAT) IV 150 mg (12/29/20 1136)   PRN Meds:.acetaminophen **OR** acetaminophen, albuterol, colchicine, midazolam, ondansetron **OR** ondansetron (ZOFRAN) IV   LABORATORY DATA: CBC: Recent Labs  Lab 12/25/20 0605 12/26/20 0740 12/27/20 0551 12/28/20 0249 12/29/20 0331  WBC 8.4 8.1 7.2 7.4 8.6  HGB 16.6 15.5 14.6 14.8 15.0  HCT 51.0 47.7 44.8 45.4 45.4  MCV 97.0 97.3 98.2 97.2 95.6  PLT 150 142* 136* 147* 164     Basic Metabolic Panel: Recent Labs  Lab 12/24/20 1650 12/25/20 0605 12/25/20 1631 12/26/20 0551 12/26/20 0740 12/27/20 0551 12/28/20 0249 12/29/20 0331  NA  --  150*  --   --  151* 150* 149* 145  K  --  4.3  --   --  4.2 4.1 3.9 4.5  CL  --  118*  --   --  123* 117* 115* 110  CO2  --  17*  --   --  19* 23 25 26   GLUCOSE  --  218*  --   --  194* 191* 163* 191*  BUN  --  54*  --   --  63* 59* 50* 50*  CREATININE  --  2.30*  --   --  2.72* 2.40* 1.87* 1.83*  CALCIUM  --  8.5*  --   --  8.1* 8.2* 8.4* 8.3*  MG 2.2 2.4 2.7* 2.8*  --   --   --   --   PHOS 3.5 2.6 3.2 3.5  --   --   --   --      GFR: Estimated Creatinine Clearance: 34.8 mL/min (A) (by C-G formula based on SCr of 1.83 mg/dL (H)).  Liver Function Tests: Recent Labs  Lab 12/24/20 0255 12/26/20 0740 12/27/20 0551 12/28/20 0249 12/29/20 0331  AST 27 18 20 20 20   ALT 26 15 17 16 14   ALKPHOS 45 41 42 42 40  BILITOT 0.6 0.5 0.5 0.4 0.3  PROT 6.2* 5.3* 5.3* 5.4* 5.6*  ALBUMIN 2.9* 2.2* 2.2* 2.3* 2.2*    No results for input(s): LIPASE, AMYLASE in the last 168 hours. Recent Labs  Lab 12/23/20 0917 12/25/20 0605 12/26/20 0740 12/29/20 0331  AMMONIA 32 52* 42* 45*      Coagulation Profile: No results for input(s): INR, PROTIME in the last 168 hours.   Cardiac Enzymes: No results for input(s): CKTOTAL, CKMB, CKMBINDEX, TROPONINI in the last 168 hours.  BNP (last 3 results) No results for input(s): PROBNP in the last 8760 hours.  Lipid Profile: No results for input(s): CHOL, HDL, LDLCALC, TRIG, CHOLHDL, LDLDIRECT in the last 72 hours.   Thyroid Function Tests: No results for input(s): TSH, T4TOTAL, FREET4, T3FREE, THYROIDAB in the last 72 hours.  Anemia Panel: No results for input(s): VITAMINB12, FOLATE, FERRITIN, TIBC, IRON, RETICCTPCT in the last 72 hours.  Urine analysis:    Component Value Date/Time   COLORURINE YELLOW 12/29/2020 1048   APPEARANCEUR CLOUDY (A) 12/29/2020 1048   LABSPEC 1.020 12/29/2020 1048   PHURINE 6.5 12/29/2020 1048   GLUCOSEU NEGATIVE 12/29/2020 1048   GLUCOSEU NEGATIVE 10/12/2020 1118   HGBUR LARGE (A) 12/29/2020 1048   BILIRUBINUR NEGATIVE 12/29/2020 1048   KETONESUR NEGATIVE 12/29/2020 1048   PROTEINUR >300 (A) 12/29/2020 1048   UROBILINOGEN 0.2 10/12/2020 1118   NITRITE NEGATIVE 12/29/2020 1048   LEUKOCYTESUR NEGATIVE 12/29/2020 1048    Sepsis Labs: Lactic Acid, Venous    Component Value Date/Time   LATICACIDVEN 0.8 01/03/2019 0046    MICROBIOLOGY: Recent Results (from the past 240 hour(s))  Resp Panel by RT-PCR (Flu A&B, Covid) Nasopharyngeal Swab     Status: None   Collection Time: 12/21/20  4:34 PM   Specimen: Nasopharyngeal Swab; Nasopharyngeal(NP) swabs in vial transport medium  Result Value Ref Range Status   SARS Coronavirus 2 by RT PCR NEGATIVE NEGATIVE Final    Comment: (NOTE) SARS-CoV-2 target nucleic acids are NOT DETECTED.  The SARS-CoV-2 RNA is generally detectable in upper respiratory specimens during the acute phase of infection. The lowest concentration of SARS-CoV-2 viral copies this assay can detect is 138 copies/mL. A negative result does not preclude  SARS-Cov-2 infection and should not be used as the sole basis for treatment or other patient management decisions. A negative result may occur with  improper specimen collection/handling, submission of specimen other than nasopharyngeal swab, presence of viral mutation(s) within the areas targeted by this assay, and inadequate number of viral copies(<138 copies/mL). A negative result must be combined with clinical observations, patient history, and epidemiological information. The expected result is Negative.  Fact Sheet for Patients:  EntrepreneurPulse.com.au  Fact Sheet for Healthcare Providers:  IncredibleEmployment.be  This test is no t yet approved or cleared by the [place] FDA and  has been authorized for detection and/or diagnosis of SARS-CoV-2 by FDA under an Emergency Use Authorization (EUA). This EUA will remain  in effect (meaning this test can be used) for the duration of the COVID-19 declaration under Section 564(b)(1) of the Act, 21 U.S.C.section 360bbb-3(b)(1), unless the authorization is terminated  or revoked sooner.       Influenza A by PCR NEGATIVE NEGATIVE Final   Influenza B by PCR NEGATIVE NEGATIVE Final    Comment: (NOTE) The Xpert Xpress SARS-CoV-2/FLU/RSV plus assay is intended as an aid in the diagnosis of influenza from Nasopharyngeal swab specimens and should not be used as a sole basis for treatment. Nasal washings and aspirates are unacceptable for Xpert Xpress SARS-CoV-2/FLU/RSV testing.  Fact Sheet for Patients: EntrepreneurPulse.com.au  Fact Sheet for Healthcare Providers: IncredibleEmployment.be  This test is not yet approved or cleared by the [place] FDA and has been authorized for detection and/or diagnosis of SARS-CoV-2 by FDA under an Emergency Use Authorization (EUA). This EUA will remain in effect (meaning this test can be used) for the duration of  the COVID-19 declaration under Section 564(b)(1) of the Act, 21 U.S.C. section 360bbb-3(b)(1), unless the authorization is terminated or revoked.  Performed at [facility], [place] [address] [place], [place] [address]   MRSA Next Gen by PCR, Nasal     Status: Abnormal   Collection Time: 12/21/20  4:36 PM   Specimen: Nasopharyngeal Swab; Nasal Swab  Result Value Ref Range Status   MRSA by PCR Next Gen NEGATIVE (A) NOT DETECTED Final    Comment:        The GeneXpert MRSA Assay (FDA approved for NASAL specimens only), is one component of a comprehensive MRSA colonization surveillance program. It is not intended to diagnose MRSA infection nor to guide or monitor treatment for MRSA infections. Performed at [facility], [place] [address], [place], [place] [address]   Culture, blood (routine x 2)     Status: Abnormal   Collection Time: 12/21/20  5:13 PM   Specimen: BLOOD RIGHT HAND  Result Value Ref Range Status   Specimen Description BLOOD RIGHT HAND  Final   Special Requests   Final    BOTTLES DRAWN AEROBIC ONLY Blood Culture results may not be optimal due to an inadequate volume of blood received in culture bottles   Culture  Setup Time   Final    GRAM POSITIVE COCCI IN CLUSTERS AEROBIC BOTTLE ONLY CRITICAL RESULT CALLED TO, READ BACK BY AND VERIFIED WITH: PHARMD ALEX L 1305 [id number] FCP    Culture (A)  Final    STAPHYLOCOCCUS EPIDERMIDIS THE SIGNIFICANCE OF ISOLATING THIS ORGANISM FROM A SINGLE SET OF BLOOD CULTURES WHEN MULTIPLE SETS ARE DRAWN IS UNCERTAIN. PLEASE NOTIFY THE MICROBIOLOGY DEPARTMENT WITHIN ONE WEEK IF SPECIATION AND SENSITIVITIES ARE REQUIRED. Performed at [facility], [place] [address], [place], [place]  [address]    Report Status 12/24/2020 FINAL  Final  Blood Culture ID Panel (Reflexed)     Status: Abnormal   Collection Time: 12/21/20  5:13 PM  Result Value Ref Range Status   Enterococcus faecalis NOT DETECTED NOT DETECTED Final   Enterococcus  Faecium NOT DETECTED NOT DETECTED Final   Listeria monocytogenes NOT DETECTED NOT DETECTED Final   Staphylococcus species DETECTED (A) NOT DETECTED Final    Comment: CRITICAL RESULT CALLED TO, READ BACK BY AND VERIFIED WITH: PHARMD ALEX L 1305 [id number] FCP    Staphylococcus aureus (BCID) NOT DETECTED NOT DETECTED Final   Staphylococcus epidermidis DETECTED (A) NOT DETECTED Final    Comment: Methicillin (oxacillin) resistant coagulase negative staphylococcus. Possible blood culture contaminant (unless isolated from more than one blood culture draw or clinical case suggests pathogenicity). No antibiotic treatment is indicated for blood  culture contaminants. CRITICAL RESULT CALLED TO, READ BACK BY AND VERIFIED WITH: PHARMD ALEX L 1305 [id number] FCP    Staphylococcus lugdunensis NOT DETECTED NOT DETECTED Final   Streptococcus species NOT DETECTED NOT DETECTED Final   Streptococcus agalactiae NOT DETECTED NOT DETECTED Final   Streptococcus pneumoniae NOT DETECTED NOT DETECTED Final   Streptococcus pyogenes NOT DETECTED NOT DETECTED Final   A.calcoaceticus-baumannii NOT DETECTED NOT DETECTED Final   Bacteroides fragilis NOT DETECTED NOT DETECTED Final   Enterobacterales NOT DETECTED NOT DETECTED Final   Enterobacter cloacae complex NOT DETECTED NOT DETECTED Final   Escherichia coli NOT DETECTED NOT DETECTED Final   Klebsiella aerogenes NOT DETECTED NOT DETECTED Final   Klebsiella oxytoca NOT DETECTED NOT DETECTED Final   Klebsiella pneumoniae NOT DETECTED NOT DETECTED Final   Proteus species NOT DETECTED NOT DETECTED Final   Salmonella species NOT DETECTED NOT DETECTED Final   Serratia marcescens NOT DETECTED NOT DETECTED Final   Haemophilus influenzae NOT DETECTED NOT DETECTED Final   Neisseria meningitidis NOT DETECTED NOT DETECTED Final   Pseudomonas aeruginosa NOT DETECTED NOT DETECTED Final   Stenotrophomonas maltophilia NOT DETECTED NOT DETECTED Final   Candida albicans NOT DETECTED NOT  DETECTED Final   Candida auris NOT DETECTED NOT DETECTED Final   Candida glabrata NOT DETECTED NOT DETECTED Final   Candida krusei NOT DETECTED NOT DETECTED Final   Candida parapsilosis NOT DETECTED NOT DETECTED Final   Candida tropicalis NOT DETECTED NOT DETECTED Final   Cryptococcus neoformans/gattii NOT DETECTED NOT DETECTED Final   Methicillin resistance mecA/C DETECTED (A) NOT DETECTED Final    Comment: CRITICAL RESULT CALLED TO, READ BACK BY AND VERIFIED WITH: PHARMD ALEX L [id number] FCP Performed at [facility], [address] [place], [place] [address]   Culture, blood (routine x 2)     Status: None   Collection Time: 12/21/20  5:45 PM   Specimen: BLOOD RIGHT ARM  Result Value Ref Range Status   Specimen Description BLOOD RIGHT ARM  Final   Special Requests   Final    BOTTLES DRAWN AEROBIC ONLY Blood Culture adequate volume   Culture   Final    NO GROWTH 5 DAYS Performed at [facility], [place] [address] [place], [place] [address]    Report Status 12/26/2020 FINAL  Final  Urine Culture     Status: Abnormal   Collection Time: 12/22/20  3:40 AM   Specimen: Urine, Clean Catch  Result Value Ref Range Status   Specimen Description URINE, CLEAN CATCH  Final   Special Requests NONE  Final   Culture (A)  Final    <  10,000 COLONIES/mL INSIGNIFICANT GROWTH Performed at [facility], [place] [address] [place], [place] [address]    Report Status 12/23/2020 FINAL  Final  Culture, blood (routine x 2)     Status: None   Collection Time: 12/24/20 11:39 AM   Specimen: BLOOD LEFT HAND  Result Value Ref Range Status   Specimen Description BLOOD LEFT HAND  Final   Special Requests BOTTLES DRAWN AEROBIC ONLY  Final   Culture   Final    NO GROWTH 5 DAYS Performed at [facility], [place] [address] [place], [place] [address]    Report Status 12/29/2020 FINAL  Final  Culture, blood (routine x 2)     Status: None   Collection Time: 12/24/20 11:49 AM   Specimen:  BLOOD RIGHT HAND  Result Value Ref Range Status   Specimen Description BLOOD RIGHT HAND  Final   Special Requests BOTTLES DRAWN AEROBIC ONLY  Final   Culture   Final    NO GROWTH 5 DAYS Performed at [facility], [place] [address], [place], [place] [address]    Report Status 12/29/2020 FINAL  Final  CSF culture w Gram Stain     Status: None   Collection Time: 12/24/20  6:19 PM   Specimen: CSF; Cerebrospinal Fluid  Result Value Ref Range Status   Specimen Description CSF  Final   Special Requests Normal  Final   Gram Stain NO ORGANISMS SEEN NO WBC SEEN CYTOSPIN SMEAR   Final   Culture   Final    NO GROWTH 3 DAYS Performed at [facility], [place] [address], [place], [place] [address]    Report Status 12/28/2020 FINAL  Final  Culture, blood (routine x 2)     Status: None (Preliminary result)   Collection Time: 12/29/20  7:23 AM   Specimen: BLOOD  Result Value Ref Range Status   Specimen Description BLOOD LEFT ANTECUBITAL  Final   Special Requests   Final    BOTTLES DRAWN AEROBIC AND ANAEROBIC Blood Culture adequate volume   Culture   Final    NO GROWTH < 12 HOURS Performed at [facility], [place] [address], [place], [place] [address]    Report Status PENDING  Incomplete  Culture, blood (routine x 2)     Status: None (Preliminary result)   Collection Time: 12/29/20  7:24 AM   Specimen: BLOOD LEFT HAND  Result Value Ref Range Status   Specimen Description BLOOD LEFT HAND  Final   Special Requests   Final    BOTTLES DRAWN AEROBIC AND ANAEROBIC Blood Culture adequate volume   Culture   Final    NO GROWTH < 12 HOURS Performed at [facility], [place] [address], [place], [place] [address]    Report Status PENDING  Incomplete    RADIOLOGY STUDIES/RESULTS: DG Chest Port 1V same Day  Result Date: 12/29/2020 CLINICAL DATA:  80 year old male with history of fever. EXAM: PORTABLE CHEST 1 VIEW COMPARISON:  Chest x-ray 12/25/2020. FINDINGS: A feeding tube is seen  extending into the abdomen, however, the tip of the feeding tube extends below the lower margin of the image. Lung volumes are low elevated right hemidiaphragm, similar to the prior study. No acute consolidative airspace disease. No pleural effusions. No pneumothorax. No evidence of pulmonary edema. Heart size is normal. Upper mediastinal contours are within normal limits allowing for patient positioning. Atherosclerotic calcifications in the thoracic aorta. Left-sided biventricular pacemaker, with lead tips projecting  over the expected location of the right atrium, right ventricle and overlying the left ventricle via the coronary sinus and coronary veins. IMPRESSION: 1. Support apparatus, as above. 2. Elevated right hemidiaphragm, similar to prior studies. 3. Low lung volumes without radiographic evidence of acute cardiopulmonary disease. 4. Aortic atherosclerosis. Electronically Signed   By: Vinnie Langton M.D.   On: 12/29/2020 07:58     LOS: 11 days   Oren Binet, MD  [facility]    To contact the attending provider between 7A-7P or the covering provider during after hours 7P-7A, please log into the web site [email or web address] and access using universal [place] password for that web site. If you do not have the password, please call the hospital operator.  12/29/2020, 1:29 PM

## 2020-12-29 NOTE — Progress Notes (Signed)
Pharmacy Antibiotic Note  David [name]. is a 80 y.o. male admitted on 12/20/2020 with focal seizures.  Hospitalization has been complicated by ongoing encephalopathy.  Pharmacy has been consulted for Vancomycin and Zosyn dosing for aspiration pneumonia.  Of note, pt was recently on Unasyn for presumed aspiration pneumonia.  Patient has some baseline renal dysfunction (baseline SCr 2) with current SCr 1.83.  Plan: Vancomycin 1750mg  IV x 1 dose Per Dr. Sloan Leiter, plan for only one dose of Vancomycin.  He will address if Vancomycin to continue following initial cx data. Zosyn 3.375 gm IV q8h (4 hour infusion).   Weight: 91.7 kg (202 lb 2.6 oz)  Temp (24hrs), Avg:99.7 F (37.6 C), Min:98.3 F (36.8 C), Max:101.1 F (38.4 C)  Recent Labs  Lab 12/25/20 0605 12/26/20 0740 12/27/20 0551 12/28/20 0249 12/29/20 0331  WBC 8.4 8.1 7.2 7.4 8.6  CREATININE 2.30* 2.72* 2.40* 1.87* 1.83*    Estimated Creatinine Clearance: 34.8 mL/min (A) (by C-G formula based on SCr of 1.83 mg/dL (H)).    Allergies  Allergen Reactions   Ezetimibe Other (See Comments)    Unknown reaction   Simvastatin Other (See Comments)    Reaction:  Gave pt a fever  Fever - temp of 103.   In 2003    Antimicrobials this admission: CTX x 1 9/13 Unasyn 9/14 > 9/17 Vanc x 1 9/16 Vanc x 1 9/21 Zosyn 9/21 >>  Dose adjustments this admission:   Microbiology results: 9/13 MRSA PCR: neg 9/13 Bcx x2:  1/2 GPC =Staph epidermidis/ Final  9/14 Ucx: insig / final 9/16 Bcx: x2: neg 9/16: CSF: neg 9/21: BCx: sent 9/21 UCx: sent  Thank you for allowing pharmacy to be a part of this patient's care.  [employer], Pharm.D., BCPS Clinical Pharmacist  **Pharmacist phone directory can be found on [email or web address] listed under [place].  12/29/2020 3:22 PM

## 2020-12-30 ENCOUNTER — Encounter (HOSPITAL_COMMUNITY): Payer: Medicare Other

## 2020-12-30 ENCOUNTER — Inpatient Hospital Stay (HOSPITAL_COMMUNITY): Payer: Medicare Other

## 2020-12-30 DIAGNOSIS — Z950 Presence of cardiac pacemaker: Secondary | ICD-10-CM

## 2020-12-30 DIAGNOSIS — R609 Edema, unspecified: Secondary | ICD-10-CM

## 2020-12-30 DIAGNOSIS — R7881 Bacteremia: Secondary | ICD-10-CM

## 2020-12-30 LAB — COMPREHENSIVE METABOLIC PANEL
ALT: 14 U/L (ref 0–44)
AST: 19 U/L (ref 15–41)
Albumin: 1.9 g/dL — ABNORMAL LOW (ref 3.5–5.0)
Alkaline Phosphatase: 41 U/L (ref 38–126)
Anion gap: 10 (ref 5–15)
BUN: 53 mg/dL — ABNORMAL HIGH (ref 8–23)
CO2: 24 mmol/L (ref 22–32)
Calcium: 7.9 mg/dL — ABNORMAL LOW (ref 8.9–10.3)
Chloride: 104 mmol/L (ref 98–111)
Creatinine, Ser: 2.2 mg/dL — ABNORMAL HIGH (ref 0.61–1.24)
GFR, Estimated: 29 mL/min — ABNORMAL LOW (ref >60)
Glucose, Bld: 230 mg/dL — ABNORMAL HIGH (ref 70–99)
Potassium: 4.4 mmol/L (ref 3.5–5.1)
Sodium: 138 mmol/L (ref 135–145)
Total Bilirubin: 0.9 mg/dL (ref 0.3–1.2)
Total Protein: 5 g/dL — ABNORMAL LOW (ref 6.5–8.1)

## 2020-12-30 LAB — GLUCOSE, CAPILLARY
Glucose-Capillary: 184 mg/dL — ABNORMAL HIGH (ref 70–99)
Glucose-Capillary: 185 mg/dL — ABNORMAL HIGH (ref 70–99)
Glucose-Capillary: 190 mg/dL — ABNORMAL HIGH (ref 70–99)
Glucose-Capillary: 194 mg/dL — ABNORMAL HIGH (ref 70–99)
Glucose-Capillary: 230 mg/dL — ABNORMAL HIGH (ref 70–99)
Glucose-Capillary: 235 mg/dL — ABNORMAL HIGH (ref 70–99)
Glucose-Capillary: 251 mg/dL — ABNORMAL HIGH (ref 70–99)

## 2020-12-30 LAB — URIC ACID: Uric Acid, Serum: 4.4 mg/dL (ref 3.7–8.6)

## 2020-12-30 LAB — CBC
HCT: 42.7 % (ref 39.0–52.0)
Hemoglobin: 13.8 g/dL (ref 13.0–17.0)
MCH: 31.3 pg (ref 26.0–34.0)
MCHC: 32.3 g/dL (ref 30.0–36.0)
MCV: 96.8 fL (ref 80.0–100.0)
Platelets: 151 10*3/uL (ref 150–400)
RBC: 4.41 MIL/uL (ref 4.22–5.81)
RDW: 13.9 % (ref 11.5–15.5)
WBC: 8.9 10*3/uL (ref 4.0–10.5)
nRBC: 0 % (ref 0.0–0.2)

## 2020-12-30 LAB — AMMONIA: Ammonia: 25 umol/L (ref 9–35)

## 2020-12-30 LAB — URINE CULTURE: Culture: NO GROWTH

## 2020-12-30 MED ORDER — SODIUM CHLORIDE 0.9 % IV SOLN
2.0000 g | INTRAVENOUS | Status: DC
  Administered 2020-12-31 (×2): 2 g via INTRAVENOUS
  Filled 2020-12-30 (×3): qty 2

## 2020-12-30 MED ORDER — VANCOMYCIN HCL IN DEXTROSE 1-5 GM/200ML-% IV SOLN
1000.0000 mg | INTRAVENOUS | Status: DC
  Administered 2020-12-30: 1000 mg via INTRAVENOUS
  Filled 2020-12-30 (×2): qty 200

## 2020-12-30 MED ORDER — FREE WATER
300.0000 mL | Freq: Four times a day (QID) | Status: DC
  Administered 2020-12-30 – 2021-01-01 (×9): 300 mL

## 2020-12-30 NOTE — Progress Notes (Addendum)
PROGRESS NOTE        PATIENT DETAILS Name: David [name]. Age: 80 y.o. Sex: male Date of Birth: [birth year] Admit Date: 12/26/2020 Admitting Physician No admitting provider for patient encounter. GUR:[id number], David Hatchet, MD  Brief Narrative: Patient is a 80 y.o. male with history of metastatic melanoma with bone/lung/brain mets-s/p brain radiation/left frontal craniotomy and tumor excision (July 2017), acute promyelocytic leukemia, hemorrhagic CVA, HTN, CAD s/p CABG 2014, AV block-s/p PPM placement, OSA on CPAP, depression who presented to [facility] with aphasia-found to have EEG evidence of seizures originating from the left temporal region-subsequently transferred to [facility] for LTM EEG.  Patient was felt to have intractable seizures-and was started on multiple antiepileptics (Onfi, Depakote, Keppra, Vimpat) subsequent hospital course complicated by encephalopathy (felt to be due to Onfi/seizure meds/AKI ) and Fever.  Subjective: A bit more awake compared to yesterday-when asked if his daughter is a OB/GYN-he shook his head yes.  He did attempt to speak but he was very gibberish.  Very weak/debilitated and frail.  Per nursing staff-required in/out Foley last night  Objective: Vitals: Blood pressure 124/76, pulse 91, temperature 99.7 F (37.6 C), temperature source Oral, resp. rate 18, weight 91.7 kg, SpO2 93 %.   Exam: Gen Exam: Awakes to loud verbal stimuli-looks very frail and weak. HEENT:atraumatic, normocephalic Chest: B/L clear to auscultation anteriorly CVS:S1S2 regular Abdomen:soft non tender, non distended Extremities:no edema Neurology: Deferred exam-has significant amount of generalized weakness-hard to assess. Skin: no rash   Pertinent labs/radiology: Na: 138 Creatinine: 2.20 WBC: 8.9 Plt: 151 Ammonia: 25  Lumbar puncture: CSF: WBC 3, protein 27  Stroke work-up: 9/16>> CT head: Right cerebellar CVA 9/16>> CTA head and neck: No LVO 9/16>>  Echo: EF 55-60% 9/16>> LDL: 42 9/16>> A1c: 5.8 9/18: CT head: No acute abnormality-lesion seen on CT head on 9/16 in right cerebellar peduncle was likely artifactual.  Microbiology: 9/14>>Blood culture: 1/2-staff epidermidis 9/16>> blood culture: No growth 9/16>> CSF culture: No growth 9/16>>CSF HSV/Crypto Ag:neg 9/21>> blood culture: 1/2 gram-positive cocci 9/21>> urine culture: No growth 9/21>> COVID PCR: Negative  Pathology: 9/16>>CSF cytology: No malignant cells identified.   Assessment/Plan: New refractory focal seizures: No further seizures on LTM EEG (last EEG seizure on 9/15).remains on Vimpat and Depakote.  Onfi/Keppra discontinued.  LTM EEG discontinued on 9/21.  Neurology following.    Acute toxic metabolic encephalopathy: Thought to be due to seizure medications (particularly Onfi which is a long half-life) in the setting of AKI/hyponatremia (significantly improved).  Gradually improving-a bit more awake and alert but still very lethargic.  Neurology following-awaiting Onfi levels.    ?Acute right cerebellar CVA: CT head on 9/16 showed possible right cerebellar stroke-however repeat CT head on 9/18 did not show stroke-it is now felt that lesion on the right cerebellar lobe on 9/16 was a artifact.  CVA ruled out.  Back on aspirin-no longer on plavix.  PPM interrogation completed-no A. fib detected.   Recurrent fever (occurred on 9/13-likely aspiration pneumonia-reoccurred on 9/20): Continues to have low-grade fever-some suspicion for aspiration pneumonitis given  severe encephalopathy-but no convincing infiltrates seen on imaging studies.  No diarrhea-Per nursing staff-no sacral skin breakdown.  Urine culture/COVID PCR negative.  1/2 blood cultures on 9/21 positive for gram-positive cocci (has PPM in place).  Remains on empiric Zosyn-received 1 dose of IV vancomycin yesterday.  Have consulted infectious disease for further guidance.  Have ordered lower extremity Dopplers as  well.  Addendum: Left arm swollen-grimaces when elbow moved-obtain CT left elbow and on Doppler ultrasound of left upper extremity.  Coag negative staph bacteremia: 1/2 blood culture on 9/14 positive for staph epidermidis-repeat cultures on 9/16 (before vancomycin started) negative so far.  Since this was thought to be contamination-vancomycin was discontinued.  Since she started having fever again on 9/20-blood cultures were repeated-1/2 culture again positive for gram-positive cocci.  I have asked ID to evaluate.  He does have a PPM in place.  AKI on CKD stage IIIb: AKI due to contrast nephropathy-improved-and back to baseline.  Avoid nephrotoxic agents.Per daughter-Dr Leggett-patient has thin basement membrane disease and has chronic microscopic hematuria  Acute urinary retention (more than 1000 cc on bladder scan on 9/18): Foley catheter discontinued on 9/21-Per nursing staff-required straight in/out catheter last night-no urine in Foley bag this morning-I have asked nursing staff to do another bladder scan this morning-if he is retaining-will require Foley to be reinserted.  Flomax to be continued.    Hypernatremia: Secondary to dehydration (no oral intake due to encephalopathy)-continue free water flushes via PEG tube.  Sodium levels have normalized.  Hyperglycemia: Likely due to tube feedings-no evidence of diabetes.  CBGs stabilized-continue Levemir 10 units twice daily and SSI.    Recent Labs    12/29/20 2310 12/30/20 0313 12/30/20 0732  GLUCAP 253* 230* 190*    HTN: BP relatively stable-on metoprolol-if blood pressure continues to be on the higher side-May need to resume amlodipine.  Reassess 9/21.  History of CAD s/p CABG in 2014: Remains stable-continue aspirin/beta-blocker.    History of complete heart block-PPM in place  History of hemiplegia and hemiparesis following nontraumatic intracranial hemorrhage affecting right dominant side  History of metastatic melanoma with  metastasis to the lingula, right upper lung, supraventricular clavicular lymph node, left frontal lobe metastasis-s/p resection: Resume outpatient follow-up with oncology/neurooncology.    History of acute promyelocytic leukemia: Follow CBC periodically--in remission-stable for outpatient follow-up with primary oncologist.  Major depressive disorder: Trazodone/Zoloft held due to encephalopathy-resume over the next few days once mentation improves further.  Hypothyroidism: Continue levothyroxine.  Recent TSH in May was within normal limits.   OSA:on CPAP-on hold as NG tube is in place.  Nutrition: Cortak tube placed on 9/16-feedings ongoing.  Will need SLP reevaluation once encephalopathy has improved before discontinuing cortak.  Functional quadriplegia/severe debility/deconditioning: Normally able to ambulate with the help of walker-current significant debility is from acute illness.  Once encephalopathy resolves-hopeful that we can start PT/OT.  Obesity: Estimated body mass index is 30.74 kg/m as calculated from the following:   Height as of 12/15/20: 5\' 8"  (1.727 m).   Weight as of this encounter: 91.7 kg.    Procedures : 9/16>> lumbar puncture by IR Consults:Neurology DVT Prophylaxis :Prophylactic Lovenox Code Status:DNR Family Communication:Daughter-[name] [phone] on 9/21-we will update later today.  Disposition Plan: Status is: Inpatient  Remains inpatient appropriate because:Inpatient level of care appropriate due to severity of illness  Dispo: The patient is from: Home              Anticipated d/c is to: SNF              Patient currently is not medically stable to d/c.   Difficult to place patient No    Barriers to Discharge: Ongoing encephalopathy-intractable seizures-LTM EEG in progress-not stable for discharge.  Antimicrobial agents: Anti-infectives (From admission, onward)    Start  Dose/Rate Route Frequency Ordered Stop   12/29/20  2100  vancomycin (VANCOREADY) IVPB 1750 mg/350 mL        1,750 mg 175 mL/hr over 120 Minutes Intravenous  Once 12/29/20 2054 12/30/20 0017   12/29/20 1630  piperacillin-tazobactam (ZOSYN) IVPB 3.375 g        3.375 g 12.5 mL/hr over 240 Minutes Intravenous Every 8 hours 12/29/20 1524     12/29/20 1630  vancomycin (VANCOREADY) IVPB 1750 mg/350 mL  Status:  Discontinued        1,750 mg 175 mL/hr over 120 Minutes Intravenous  Once 12/29/20 1524 12/29/20 2054   12/26/20 1600  vancomycin (VANCOREADY) IVPB 1250 mg/250 mL  Status:  Discontinued        1,250 mg 166.7 mL/hr over 90 Minutes Intravenous Every 48 hours 12/24/20 1252 12/25/20 1038   12/26/20 1600  vancomycin (VANCOCIN) IVPB 1000 mg/200 mL premix  Status:  Discontinued        1,000 mg 200 mL/hr over 60 Minutes Intravenous Every 48 hours 12/25/20 1038 12/25/20 1547   12/24/20 1800  Ampicillin-Sulbactam (UNASYN) 3 g in sodium chloride 0.9 % 100 mL IVPB        3 g 200 mL/hr over 30 Minutes Intravenous Every 12 hours 12/24/20 1309 12/25/20 1754   12/24/20 1345  vancomycin (VANCOREADY) IVPB 1750 mg/350 mL        1,750 mg 175 mL/hr over 120 Minutes Intravenous  Once 12/24/20 1251 12/24/20 1548   12/22/20 1300  ampicillin-sulbactam (UNASYN) 1.5 g in sodium chloride 0.9 % 100 mL IVPB  Status:  Discontinued        1.5 g 200 mL/hr over 30 Minutes Intravenous Every 8 hours 12/22/20 1202 12/24/20 1309   12/21/20 1900  azithromycin (ZITHROMAX) 500 mg in sodium chloride 0.9 % 250 mL IVPB  Status:  Discontinued        500 mg 250 mL/hr over 60 Minutes Intravenous Every 24 hours 12/21/20 1805 12/22/20 1058   12/21/20 1730  cefTRIAXone (ROCEPHIN) 1 g in sodium chloride 0.9 % 100 mL IVPB  Status:  Discontinued        1 g 200 mL/hr over 30 Minutes Intravenous Every 24 hours 12/21/20 1633 12/22/20 1058   12/21/20 1730  azithromycin (ZITHROMAX) tablet 500 mg  Status:  Discontinued        500 mg Oral Daily 12/21/20 1633 12/21/20 1804        Time  spent: 35 minutes-Greater than 50% of this time was spent in counseling, explanation of diagnosis, planning of further management, and coordination of care.  MEDICATIONS: Scheduled Meds:  allopurinol  200 mg Per Tube Daily   aspirin  81 mg Per Tube Daily   Chlorhexidine Gluconate Cloth  6 each Topical Daily   enoxaparin (LOVENOX) injection  30 mg Subcutaneous Q24H   feeding supplement (PROSource TF)  45 mL Per Tube BID   free water  300 mL Per Tube Q6H   insulin aspart  0-15 Units Subcutaneous Q4H   insulin aspart  0-5 Units Subcutaneous QHS   insulin detemir  10 Units Subcutaneous BID   lactulose  30 g Per Tube Daily   levothyroxine  137 mcg Per Tube Q0600   lidocaine (PF)  5 mL Other Once   metoprolol tartrate  37.5 mg Per Tube BID   tamsulosin  0.4 mg Oral Daily   valproic acid  500 mg Per Tube BID   And   valproic acid  250 mg Per Tube Q24H  Continuous Infusions:  sodium chloride Stopped (12/28/20 0843)   feeding supplement (OSMOLITE 1.5 CAL) 1,000 mL (12/29/20 1712)   lacosamide (VIMPAT) IV Stopped (12/29/20 2147)   piperacillin-tazobactam (ZOSYN)  IV 3.375 g (12/30/20 0852)   PRN Meds:.acetaminophen **OR** acetaminophen, albuterol, colchicine, midazolam, ondansetron **OR** ondansetron (ZOFRAN) IV   LABORATORY DATA: CBC: Recent Labs  Lab 12/26/20 0740 12/27/20 0551 12/28/20 0249 12/29/20 0331 12/30/20 0305  WBC 8.1 7.2 7.4 8.6 8.9  HGB 15.5 14.6 14.8 15.0 13.8  HCT 47.7 44.8 45.4 45.4 42.7  MCV 97.3 98.2 97.2 95.6 96.8  PLT 142* 136* 147* 164 151     Basic Metabolic Panel: Recent Labs  Lab 12/24/20 1650 12/25/20 0605 12/25/20 1631 12/26/20 0551 12/26/20 0740 12/27/20 0551 12/28/20 0249 12/29/20 0331 12/30/20 0305  NA  --  150*  --   --  151* 150* 149* 145 138  K  --  4.3  --   --  4.2 4.1 3.9 4.5 4.4  CL  --  118*  --   --  123* 117* 115* 110 104  CO2  --  17*  --   --  19* 23 25 26 24   GLUCOSE  --  218*  --   --  194* 191* 163* 191* 230*  BUN   --  54*  --   --  63* 59* 50* 50* 53*  CREATININE  --  2.30*  --   --  2.72* 2.40* 1.87* 1.83* 2.20*  CALCIUM  --  8.5*  --   --  8.1* 8.2* 8.4* 8.3* 7.9*  MG 2.2 2.4 2.7* 2.8*  --   --   --   --   --   PHOS 3.5 2.6 3.2 3.5  --   --   --   --   --      GFR: Estimated Creatinine Clearance: 28.9 mL/min (A) (by C-G formula based on SCr of 2.2 mg/dL (H)).  Liver Function Tests: Recent Labs  Lab 12/26/20 0740 12/27/20 0551 12/28/20 0249 12/29/20 0331 12/30/20 0305  AST 18 20 20 20 19   ALT 15 17 16 14 14   ALKPHOS 41 42 42 40 41  BILITOT 0.5 0.5 0.4 0.3 0.9  PROT 5.3* 5.3* 5.4* 5.6* 5.0*  ALBUMIN 2.2* 2.2* 2.3* 2.2* 1.9*    No results for input(s): LIPASE, AMYLASE in the last 168 hours. Recent Labs  Lab 12/25/20 0605 12/26/20 0740 12/29/20 0331 12/30/20 0305  AMMONIA 52* 42* 45* 25     Coagulation Profile: No results for input(s): INR, PROTIME in the last 168 hours.   Cardiac Enzymes: No results for input(s): CKTOTAL, CKMB, CKMBINDEX, TROPONINI in the last 168 hours.  BNP (last 3 results) No results for input(s): PROBNP in the last 8760 hours.  Lipid Profile: No results for input(s): CHOL, HDL, LDLCALC, TRIG, CHOLHDL, LDLDIRECT in the last 72 hours.   Thyroid Function Tests: No results for input(s): TSH, T4TOTAL, FREET4, T3FREE, THYROIDAB in the last 72 hours.  Anemia Panel: No results for input(s): VITAMINB12, FOLATE, FERRITIN, TIBC, IRON, RETICCTPCT in the last 72 hours.  Urine analysis:    Component Value Date/Time   COLORURINE YELLOW 12/29/2020 1048   APPEARANCEUR CLOUDY (A) 12/29/2020 1048   LABSPEC 1.020 12/29/2020 1048   PHURINE 6.5 12/29/2020 1048   GLUCOSEU NEGATIVE 12/29/2020 1048   GLUCOSEU NEGATIVE 10/12/2020 1118   HGBUR LARGE (A) 12/29/2020 1048   BILIRUBINUR NEGATIVE 12/29/2020 1048   KETONESUR NEGATIVE 12/29/2020 1048   PROTEINUR >300 (A)  12/29/2020 1048   UROBILINOGEN 0.2 10/12/2020 1118   NITRITE NEGATIVE 12/29/2020 1048    LEUKOCYTESUR NEGATIVE 12/29/2020 1048    Sepsis Labs: Lactic Acid, Venous    Component Value Date/Time   LATICACIDVEN 0.8 01/03/2019 0046    MICROBIOLOGY: Recent Results (from the past 240 hour(s))  Resp Panel by RT-PCR (Flu A&B, Covid) Nasopharyngeal Swab     Status: None   Collection Time: 12/21/20  4:34 PM   Specimen: Nasopharyngeal Swab; Nasopharyngeal(NP) swabs in vial transport medium  Result Value Ref Range Status   SARS Coronavirus 2 by RT PCR NEGATIVE NEGATIVE Final    Comment: (NOTE) SARS-CoV-2 target nucleic acids are NOT DETECTED.  The SARS-CoV-2 RNA is generally detectable in upper respiratory specimens during the acute phase of infection. The lowest concentration of SARS-CoV-2 viral copies this assay can detect is 138 copies/mL. A negative result does not preclude SARS-Cov-2 infection and should not be used as the sole basis for treatment or other patient management decisions. A negative result may occur with  improper specimen collection/handling, submission of specimen other than nasopharyngeal swab, presence of viral mutation(s) within the areas targeted by this assay, and inadequate number of viral copies(<138 copies/mL). A negative result must be combined with clinical observations, patient history, and epidemiological information. The expected result is Negative.  Fact Sheet for Patients:  EntrepreneurPulse.com.au  Fact Sheet for Healthcare Providers:  IncredibleEmployment.be  This test is no t yet approved or cleared by the [place] FDA and  has been authorized for detection and/or diagnosis of SARS-CoV-2 by FDA under an Emergency Use Authorization (EUA). This EUA will remain  in effect (meaning this test can be used) for the duration of the COVID-19 declaration under Section 564(b)(1) of the Act, 21 U.S.C.section 360bbb-3(b)(1), unless the authorization is terminated  or revoked sooner.       Influenza A by  PCR NEGATIVE NEGATIVE Final   Influenza B by PCR NEGATIVE NEGATIVE Final    Comment: (NOTE) The Xpert Xpress SARS-CoV-2/FLU/RSV plus assay is intended as an aid in the diagnosis of influenza from Nasopharyngeal swab specimens and should not be used as a sole basis for treatment. Nasal washings and aspirates are unacceptable for Xpert Xpress SARS-CoV-2/FLU/RSV testing.  Fact Sheet for Patients: EntrepreneurPulse.com.au  Fact Sheet for Healthcare Providers: IncredibleEmployment.be  This test is not yet approved or cleared by the [place] FDA and has been authorized for detection and/or diagnosis of SARS-CoV-2 by FDA under an Emergency Use Authorization (EUA). This EUA will remain in effect (meaning this test can be used) for the duration of the COVID-19 declaration under Section 564(b)(1) of the Act, 21 U.S.C. section 360bbb-3(b)(1), unless the authorization is terminated or revoked.  Performed at [facility], [place] [address] [place], [place] [address]   MRSA Next Gen by PCR, Nasal     Status: Abnormal   Collection Time: 12/21/20  4:36 PM   Specimen: Nasopharyngeal Swab; Nasal Swab  Result Value Ref Range Status   MRSA by PCR Next Gen NEGATIVE (A) NOT DETECTED Final    Comment:        The GeneXpert MRSA Assay (FDA approved for NASAL specimens only), is one component of a comprehensive MRSA colonization surveillance program. It is not intended to diagnose MRSA infection nor to guide or monitor treatment for MRSA infections. Performed at [facility], [place] [address], [place], [place] [address]   Culture, blood (routine x 2)     Status: Abnormal  Collection Time: 12/21/20  5:13 PM   Specimen: BLOOD RIGHT HAND  Result Value Ref Range Status   Specimen Description BLOOD RIGHT HAND  Final   Special Requests   Final    BOTTLES DRAWN AEROBIC ONLY Blood Culture results may not be optimal due to an inadequate volume of blood  received in culture bottles   Culture  Setup Time   Final    GRAM POSITIVE COCCI IN CLUSTERS AEROBIC BOTTLE ONLY CRITICAL RESULT CALLED TO, READ BACK BY AND VERIFIED WITH: PHARMD ALEX L 1305 [id number] FCP    Culture (A)  Final    STAPHYLOCOCCUS EPIDERMIDIS THE SIGNIFICANCE OF ISOLATING THIS ORGANISM FROM A SINGLE SET OF BLOOD CULTURES WHEN MULTIPLE SETS ARE DRAWN IS UNCERTAIN. PLEASE NOTIFY THE MICROBIOLOGY DEPARTMENT WITHIN ONE WEEK IF SPECIATION AND SENSITIVITIES ARE REQUIRED. Performed at [facility], [place] [address] [place], [place] [address]    Report Status 12/24/2020 FINAL  Final  Blood Culture ID Panel (Reflexed)     Status: Abnormal   Collection Time: 12/21/20  5:13 PM  Result Value Ref Range Status   Enterococcus faecalis NOT DETECTED NOT DETECTED Final   Enterococcus Faecium NOT DETECTED NOT DETECTED Final   Listeria monocytogenes NOT DETECTED NOT DETECTED Final   Staphylococcus species DETECTED (A) NOT DETECTED Final    Comment: CRITICAL RESULT CALLED TO, READ BACK BY AND VERIFIED WITH: PHARMD ALEX L 1305 [id number] FCP    Staphylococcus aureus (BCID) NOT DETECTED NOT DETECTED Final   Staphylococcus epidermidis DETECTED (A) NOT DETECTED Final    Comment: Methicillin (oxacillin) resistant coagulase negative staphylococcus. Possible blood culture contaminant (unless isolated from more than one blood culture draw or clinical case suggests pathogenicity). No antibiotic treatment is indicated for blood  culture contaminants. CRITICAL RESULT CALLED TO, READ BACK BY AND VERIFIED WITH: PHARMD ALEX L 1305 [id number] FCP    Staphylococcus lugdunensis NOT DETECTED NOT DETECTED Final   Streptococcus species NOT DETECTED NOT DETECTED Final   Streptococcus agalactiae NOT DETECTED NOT DETECTED Final   Streptococcus pneumoniae NOT DETECTED NOT DETECTED Final   Streptococcus pyogenes NOT DETECTED NOT DETECTED Final   A.calcoaceticus-baumannii NOT DETECTED NOT DETECTED Final   Bacteroides  fragilis NOT DETECTED NOT DETECTED Final   Enterobacterales NOT DETECTED NOT DETECTED Final   Enterobacter cloacae complex NOT DETECTED NOT DETECTED Final   Escherichia coli NOT DETECTED NOT DETECTED Final   Klebsiella aerogenes NOT DETECTED NOT DETECTED Final   Klebsiella oxytoca NOT DETECTED NOT DETECTED Final   Klebsiella pneumoniae NOT DETECTED NOT DETECTED Final   Proteus species NOT DETECTED NOT DETECTED Final   Salmonella species NOT DETECTED NOT DETECTED Final   Serratia marcescens NOT DETECTED NOT DETECTED Final   Haemophilus influenzae NOT DETECTED NOT DETECTED Final   Neisseria meningitidis NOT DETECTED NOT DETECTED Final   Pseudomonas aeruginosa NOT DETECTED NOT DETECTED Final   Stenotrophomonas maltophilia NOT DETECTED NOT DETECTED Final   Candida albicans NOT DETECTED NOT DETECTED Final   Candida auris NOT DETECTED NOT DETECTED Final   Candida glabrata NOT DETECTED NOT DETECTED Final   Candida krusei NOT DETECTED NOT DETECTED Final   Candida parapsilosis NOT DETECTED NOT DETECTED Final   Candida tropicalis NOT DETECTED NOT DETECTED Final   Cryptococcus neoformans/gattii NOT DETECTED NOT DETECTED Final   Methicillin resistance mecA/C DETECTED (A) NOT DETECTED Final    Comment: CRITICAL RESULT CALLED TO, READ BACK BY AND VERIFIED WITH: Dale Nixa [id number] FCP Performed at [facility],  [address] [place], [place] [address]   Culture, blood (routine x 2)     Status: None   Collection Time: 12/21/20  5:45 PM   Specimen: BLOOD RIGHT ARM  Result Value Ref Range Status   Specimen Description BLOOD RIGHT ARM  Final   Special Requests   Final    BOTTLES DRAWN AEROBIC ONLY Blood Culture adequate volume   Culture   Final    NO GROWTH 5 DAYS Performed at [facility], [place] [address], [place], [place] [address]    Report Status 12/26/2020 FINAL  Final  Urine Culture     Status: Abnormal   Collection Time: 12/22/20  3:40 AM   Specimen: Urine, Clean Catch   Result Value Ref Range Status   Specimen Description URINE, CLEAN CATCH  Final   Special Requests NONE  Final   Culture (A)  Final    <10,000 COLONIES/mL INSIGNIFICANT GROWTH Performed at [facility], [place] [address], [place], [place] [address]    Report Status 12/23/2020 FINAL  Final  Culture, blood (routine x 2)     Status: None   Collection Time: 12/24/20 11:39 AM   Specimen: BLOOD LEFT HAND  Result Value Ref Range Status   Specimen Description BLOOD LEFT HAND  Final   Special Requests BOTTLES DRAWN AEROBIC ONLY  Final   Culture   Final    NO GROWTH 5 DAYS Performed at [facility], [place] [address], [place], [place] [address]    Report Status 12/29/2020 FINAL  Final  Culture, blood (routine x 2)     Status: None   Collection Time: 12/24/20 11:49 AM   Specimen: BLOOD RIGHT HAND  Result Value Ref Range Status   Specimen Description BLOOD RIGHT HAND  Final   Special Requests BOTTLES DRAWN AEROBIC ONLY  Final   Culture   Final    NO GROWTH 5 DAYS Performed at [facility], [place] [address] [place], [place] [address]    Report Status 12/29/2020 FINAL  Final  CSF culture w Gram Stain     Status: None   Collection Time: 12/24/20  6:19 PM   Specimen: CSF; Cerebrospinal Fluid  Result Value Ref Range Status   Specimen Description CSF  Final   Special Requests Normal  Final   Gram Stain NO ORGANISMS SEEN NO WBC SEEN CYTOSPIN SMEAR   Final   Culture   Final    NO GROWTH 3 DAYS Performed at [facility], [place] [address], [place], [place] [address]    Report Status 12/28/2020 FINAL  Final  Culture, blood (routine x 2)     Status: None (Preliminary result)   Collection Time: 12/29/20  7:23 AM   Specimen: BLOOD  Result Value Ref Range Status   Specimen Description BLOOD LEFT ANTECUBITAL  Final   Special Requests   Final    BOTTLES DRAWN AEROBIC AND ANAEROBIC Blood Culture adequate volume   Culture  Setup Time   Final    GRAM POSITIVE COCCI AEROBIC  BOTTLE ONLY CRITICAL VALUE NOTED.  VALUE IS CONSISTENT WITH PREVIOUSLY REPORTED AND CALLED VALUE.    Culture   Final    NO GROWTH < 12 HOURS Performed at [facility], [place] [address] [place], [place] [address]    Report Status PENDING  Incomplete  Culture, blood (routine x 2)     Status: None (Preliminary result)   Collection Time: 12/29/20  7:24 AM  Specimen: BLOOD LEFT HAND  Result Value Ref Range Status   Specimen Description BLOOD LEFT HAND  Final   Special Requests   Final    BOTTLES DRAWN AEROBIC AND ANAEROBIC Blood Culture adequate volume   Culture   Final    NO GROWTH < 12 HOURS Performed at [facility], [address] [place], [place] [address]    Report Status PENDING  Incomplete  Resp Panel by RT-PCR (Flu A&B, Covid) Nasopharyngeal Swab     Status: None   Collection Time: 12/29/20  8:49 AM   Specimen: Nasopharyngeal Swab; Nasopharyngeal(NP) swabs in vial transport medium  Result Value Ref Range Status   SARS Coronavirus 2 by RT PCR NEGATIVE NEGATIVE Final    Comment: (NOTE) SARS-CoV-2 target nucleic acids are NOT DETECTED.  The SARS-CoV-2 RNA is generally detectable in upper respiratory specimens during the acute phase of infection. The lowest concentration of SARS-CoV-2 viral copies this assay can detect is 138 copies/mL. A negative result does not preclude SARS-Cov-2 infection and should not be used as the sole basis for treatment or other patient management decisions. A negative result may occur with  improper specimen collection/handling, submission of specimen other than nasopharyngeal swab, presence of viral mutation(s) within the areas targeted by this assay, and inadequate number of viral copies(<138 copies/mL). A negative result must be combined with clinical observations, patient history, and epidemiological information. The expected result is Negative.  Fact Sheet for Patients:  EntrepreneurPulse.com.au  Fact Sheet for  Healthcare Providers:  IncredibleEmployment.be  This test is no t yet approved or cleared by the [place] FDA and  has been authorized for detection and/or diagnosis of SARS-CoV-2 by FDA under an Emergency Use Authorization (EUA). This EUA will remain  in effect (meaning this test can be used) for the duration of the COVID-19 declaration under Section 564(b)(1) of the Act, 21 U.S.C.section 360bbb-3(b)(1), unless the authorization is terminated  or revoked sooner.       Influenza A by PCR NEGATIVE NEGATIVE Final   Influenza B by PCR NEGATIVE NEGATIVE Final    Comment: (NOTE) The Xpert Xpress SARS-CoV-2/FLU/RSV plus assay is intended as an aid in the diagnosis of influenza from Nasopharyngeal swab specimens and should not be used as a sole basis for treatment. Nasal washings and aspirates are unacceptable for Xpert Xpress SARS-CoV-2/FLU/RSV testing.  Fact Sheet for Patients: EntrepreneurPulse.com.au  Fact Sheet for Healthcare Providers: IncredibleEmployment.be  This test is not yet approved or cleared by the [place] FDA and has been authorized for detection and/or diagnosis of SARS-CoV-2 by FDA under an Emergency Use Authorization (EUA). This EUA will remain in effect (meaning this test can be used) for the duration of the COVID-19 declaration under Section 564(b)(1) of the Act, 21 U.S.C. section 360bbb-3(b)(1), unless the authorization is terminated or revoked.  Performed at [facility], [place] [address], [place], [place] [address]   Urine Culture     Status: None   Collection Time: 12/29/20 10:48 AM   Specimen: Urine, Catheterized  Result Value Ref Range Status   Specimen Description URINE, CATHETERIZED  Final   Special Requests NONE  Final   Culture   Final    NO GROWTH Performed at [facility], [address] [place], [place] [address]    Report Status 12/30/2020 FINAL  Final     RADIOLOGY STUDIES/RESULTS: DG Chest Port 1V same Day  Result Date: 12/29/2020 CLINICAL DATA:  80 year old male with history of fever. EXAM:  PORTABLE CHEST 1 VIEW COMPARISON:  Chest x-ray 12/25/2020. FINDINGS: A feeding tube is seen extending into the abdomen, however, the tip of the feeding tube extends below the lower margin of the image. Lung volumes are low elevated right hemidiaphragm, similar to the prior study. No acute consolidative airspace disease. No pleural effusions. No pneumothorax. No evidence of pulmonary edema. Heart size is normal. Upper mediastinal contours are within normal limits allowing for patient positioning. Atherosclerotic calcifications in the thoracic aorta. Left-sided biventricular pacemaker, with lead tips projecting over the expected location of the right atrium, right ventricle and overlying the left ventricle via the coronary sinus and coronary veins. IMPRESSION: 1. Support apparatus, as above. 2. Elevated right hemidiaphragm, similar to prior studies. 3. Low lung volumes without radiographic evidence of acute cardiopulmonary disease. 4. Aortic atherosclerosis. Electronically Signed   By: Vinnie Langton M.D.   On: 12/29/2020 07:58   CT CHEST ABDOMEN PELVIS WO CONTRAST  Result Date: 12/29/2020 CLINICAL DATA:  Abdominal pain and fever.  Aspiration pneumonia. EXAM: CT CHEST, ABDOMEN AND PELVIS WITHOUT CONTRAST TECHNIQUE: Multidetector CT imaging of the chest, abdomen and pelvis was performed following the standard protocol without IV contrast. COMPARISON:  01/05/2019 FINDINGS: CT CHEST FINDINGS Cardiovascular: Normal heart size. No pericardial effusions. Postoperative changes consistent with coronary bypass. Cardiac pacemaker. Calcification of the aorta. No aneurysm. Mediastinum/Nodes: Enteric tube in the esophagus. Tip is in the proximal duodenum. Esophagus is decompressed. Thyroid gland is unremarkable. No significant lymphadenopathy. Lungs/Pleura: Motion artifact  limits examination. Infiltration or atelectasis in the lung bases, slightly greater on the right. No focal consolidation. Airways are patent. No pleural effusions. No pneumothorax. Musculoskeletal: Degenerative changes in the thoracic spine. Sternotomy wires. No destructive bone lesions. CT ABDOMEN PELVIS FINDINGS Hepatobiliary: No focal liver lesions. Diffusely increased density of the gallbladder could represent milk of calcium or sludge. No discrete stones are identified. No inflammatory changes. No bile duct dilatation. Pancreas: Unremarkable. No pancreatic ductal dilatation or surrounding inflammatory changes. Spleen: Normal in size without focal abnormality. Adrenals/Urinary Tract: Subcentimeter areas of increased density in both kidneys possibly representing small hemorrhagic cysts. No definite stones. Midpole cyst in the right kidney. Mild prominence of the right pelvis and right ureter with slight stranding. This could indicate pyelonephritis. Bladder wall is not thickened. There is air in the bladder. This could arise from instrumentation, infection, or fistula. Stomach/Bowel: The stomach, small bowel, and colon are not abnormally distended. Contrast material flows through to the colon without evidence of bowel obstruction. Diverticulosis most prominent in the sigmoid colon. No definite inflammatory change. No evidence of diverticulitis. Appendix is normal. Vascular/Lymphatic: Calcified and tortuous aorta. No aneurysm. No significant lymphadenopathy. Flat IVC may indicate hypovolemia. Reproductive: Prostate is unremarkable. Other: No free air or free fluid in the abdomen. Minimal periumbilical hernia containing fat. Musculoskeletal: Mild lumbar scoliosis convex towards the left. Degenerative changes in the spine. No destructive bone lesions. IMPRESSION: 1. Mild atelectasis or infiltration in the lung bases, greater on the right. 2. The kidneys demonstrate subcentimeter areas of increased attenuation  possibly indicating small hemorrhagic cysts. No definitive stones. 3. Mild prominence of right renal collecting system with some thickening and stranding of the ureter may indicate pyelonephritis. 4. Nonspecific gas in the bladder. No bladder wall thickening or stone. 5. Increased density of the gallbladder may represent sludge or milk of calcium. No definitive stones identified. 6. Aortic atherosclerosis. Electronically Signed   By: Lucienne Capers M.D.   On: 12/29/2020 19:59     LOS: 12 days  Oren Binet, MD  [facility]    To contact the attending provider between 7A-7P or the covering provider during after hours 7P-7A, please log into the web site [email or web address] and access using universal [place] password for that web site. If you do not have the password, please call the hospital operator.  12/30/2020, 9:45 AM

## 2020-12-30 NOTE — Progress Notes (Signed)
Physical Therapy Treatment Patient Details Name: David [name]. MRN: [id number] DOB: 24-Oct-1940 Today's Date: 12/30/2020   History of Present Illness 80 y.o. maled presented to ER 12/28/2020 secondary to AMS, word-finding difficulties; admitted for TIA/CVA work up.  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  Per neurology, patient noted with active seizure activity on EEG, correlating with episodes of speech difficulties. Keppra initiated (and planning to increase dosage). 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  Pt with change in status ~9/15 with new onset RT sided weakness, inability to follow commands, and lethargy. CT 9/18 revealed no acute changes.    PT Comments    Pt progressing slowly towards physical therapy goals. Lethargic initially, however did become more alert and maintaining eyes open after rolling R and L for pericare and linen change 2 BM in the bed. Sacral foam changed and barrier cream applied. Pt tolerated ~8 minutes sitting EOB with total assist for balance support and safety. Pt turned head to voice x1 but otherwise was not following any commands. SNF remains the most appropriate d/c disposition. If pt maintains alertness he would be appropriate for nursing staff to transfer to chair with maxi-move lift. RN updated at end of session regarding new bowel movement, and asked for Prevalon boots to be ordered. Will continue to follow.     Recommendations for follow up therapy are one component of a multi-disciplinary discharge planning process, led by the attending physician.  Recommendations may be updated based on patient status, additional functional criteria and insurance  authorization.  Follow Up Recommendations  SNF     Equipment Recommendations  Wheelchair (measurements PT);Wheelchair cushion (measurements PT);Hospital bed;Other (comment) (hoyer lift)    Recommendations for Other Services       Precautions / Restrictions Precautions Precautions: Fall Precaution Comments: seziure precautions Required Braces or Orthoses:  (Bil heel protectors.) Restrictions Weight Bearing Restrictions: No     Mobility  Bed Mobility Overal bed mobility: Needs Assistance Bed Mobility: Sidelying to Sit;Rolling;Sit to Sidelying Rolling: Total assist;+2 for physical assistance;+2 for safety/equipment Sidelying to sit: Total assist;+2 for physical assistance;+2 for safety/equipment   Sit to supine: Total assist;+2 for physical assistance;+2 for safety/equipment   General bed mobility comments: Pt soiled when PT/OT arrived and rolled several times for pericare/linen change. +2 total assist for all bed mobility and repositioning in the bed.    Transfers                    Ambulation/Gait                 Stairs             Wheelchair Mobility    Modified Rankin (Stroke Patients Only)       Balance Overall balance assessment: Needs assistance Sitting-balance support: Single extremity supported;Feet supported Sitting balance-Leahy Scale: Zero Sitting balance - Comments: Max-Total Assist from physical therapist to sit EOB throughout session. Postural control: Posterior lean;Left lateral lean     Standing balance comment: Not able to assess                            Cognition Arousal/Alertness: Lethargic Behavior During Therapy: Flat affect Overall Cognitive Status: Impaired/Different from baseline Area of  Impairment: Orientation;Attention;Following commands;Memory;Safety/judgement;Problem solving;Awareness                 Orientation Level:  (Unable to answer any orientation questions. Slightly turned his  head to his name being called x1.) Current Attention Level: Focused Memory: Decreased short-term memory Following Commands: Follows one step commands inconsistently;Follows one step commands with increased time (with increased time) Safety/Judgement: Decreased awareness of safety;Decreased awareness of deficits Awareness: Intellectual Problem Solving: Slow processing;Decreased initiation;Difficulty sequencing;Requires verbal cues;Requires tactile cues        Exercises      General Comments        Pertinent Vitals/Pain Pain Assessment: Faces Faces Pain Scale: Hurts little more Pain Location: with scapular retraction/pec stretch/cervical PROM Pain Descriptors / Indicators: Grimacing Pain Intervention(s): Limited activity within patient's tolerance;Monitored during session;Repositioned    Home Living                      Prior Function            PT Goals (current goals can now be found in the care plan section) Acute Rehab PT Goals Patient Stated Goal: None stated - no family present. PT Goal Formulation: Patient unable to participate in goal setting Time For Goal Achievement: 01/10/21 Potential to Achieve Goals: Fair Progress towards PT goals: Progressing toward goals    Frequency    Min 2X/week      PT Plan Current plan remains appropriate    Co-evaluation PT/OT/SLP Co-Evaluation/Treatment: Yes Reason for Co-Treatment: Complexity of the patient's impairments (multi-system involvement);Necessary to address cognition/behavior during functional activity;For patient/therapist safety;To address functional/ADL transfers PT goals addressed during session: Mobility/safety with mobility;Strengthening/ROM;Balance        AM-PAC PT "6 Clicks" Mobility   Outcome Measure  Help needed turning from your back to your side while in a flat bed without using bedrails?: Total Help needed moving from lying on your back to sitting on the side of a flat bed without using  bedrails?: Total Help needed moving to and from a bed to a chair (including a wheelchair)?: Total Help needed standing up from a chair using your arms (e.g., wheelchair or bedside chair)?: Total Help needed to walk in hospital room?: Total Help needed climbing 3-5 steps with a railing? : Total 6 Click Score: 6    End of Session Equipment Utilized During Treatment: Gait belt Activity Tolerance: Patient limited by lethargy Patient left: in bed;with call bell/phone within reach;with bed alarm set;with family/visitor present Nurse Communication: Mobility status PT Visit Diagnosis: Muscle weakness (generalized) (M62.81);Difficulty in walking, not elsewhere classified (R26.2);Hemiplegia and hemiparesis Hemiplegia - Right/Left: Right Hemiplegia - dominant/non-dominant: Dominant Hemiplegia - caused by: Other cerebrovascular disease (post ictal)     Time: 1330-1400 PT Time Calculation (min) (ACUTE ONLY): 30 min  Charges:  $Therapeutic Activity: 8-22 mins                     Rolinda Roan, PT, DPT Acute Rehabilitation Services Pager: [phone] Office: [phone]    Thelma Comp 12/30/2020, 4:10 PM

## 2020-12-30 NOTE — Consult Note (Addendum)
[facility] for Infectious Disease    Date of Admission:  01/02/2021     Reason for Consult: bacteremia/sepsis    Referring Provider: Oren Binet    Lines:  Peripheral iv's  Abx: 9/21-c vanc 9/21-c piptazo       9/16 vanc 9/14-16 amp-sulb 9/13-14 azith/ceftriaxone  Assessment: 80 y.o. 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 I have called micro lab 9/22 to workup susceptibility profile for 9/13 staph epi. Will wait to see what 9/21 blood cx show to see if TEE would be warranted. So far tte no obvious evidence of pacemaker infection/endocarditis  In the mean time, other consideration for fever is aspiration pneumonitis. Cxr has been negative but at times remain fairly insensitive. However, reasonable to transition to oral abx via tube feed and give short course 3-5 days  His melanoma (presumed in remission)/apml unlikely to play a role. He has no obvious sign of drug fever or other infectious syndrome at this time. Lft's have been normal. Ucx 9/14 <10k insign growth; previous csf cx 9/16 negative. Ucx 9/21 repeat is in progress   Plan: Stop piptazo. Will switch to cefepime for time being; if stable respiratory status and negative bcx can stop in 2-3 days Continue vanc Await labs setup of  9/13 bcx susceptibility F/u 9/21 bcx and ucx; repeat bcx again today   Discussed with primary team   I spent 60 minute reviewing data/chart, and coordinating care and >50% direct face to face time providing counseling/discussing diagnostics/treatment plan with patient      ------------------------------------------------ Principal Problem:   Focal seizures ([place]) Active Problems:   ICH (intracerebral hemorrhage) ([place]) - L frontal due to HTN vs CAA   Benign essential HTN   OSA on CPAP   Hemiplegia and hemiparesis following nontraumatic intracerebral hemorrhage affecting right dominant side ([place])   Chronic kidney disease, stage III (moderate) ([place])   Metastatic melanoma to lung ([place])   Acquired hypothyroidism   Hyperlipidemia   Malnutrition of moderate degree    HPI: David [name]. is a 80 y.o. 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 He had received long acting antiepleptic along with being on multiple now. Has had hypoactive encephalopathy. He has 9/16 lumbar puncture -- csf cytology negative; wbc 81, only 3 wbc; 27 protein, 81 glucose. Csf cx negative  With regard to his sepsis he had 2 episodes of fever; on hd #2 and HD #10. He was thought to have aspiration pna. And  had brief abx courses for such. Cxr remains clear. Bcx hd#2 1 of 2 set with staph epi (no susceptibility profile), given a dose of vanc but stopped as sepsis resolved with pna tx and thought to be contaminant and repeat bcx HD #6 negative. With his second sepsis episode on HD  10, the bcx again is showing 1 of 2 set with GPC. He hasn't had any leukocytosis.   Lft's been normal No rash reported  ID called for concern of pacer infection evaluation Patient started on piptazo and vanc  He remains rather encephalopathic still although some improvement per primary team  He is on tube feed Has a left UE ct pending for swelling of soft tissue   His fever curve seems to be improving today within 24 hours abx starting He remains sleeping/encephalopathic and unable to go through history/exam with me  Signout is via chart review and discussing with primary attending    Review of prior oncology note from 11/29/2020 [facility] #melanoma Last chemo 12/2016-12/2017 (nivolamab and tapering dexa) 09/2018 pet scan no findings active malignancy Chronic encephalopathy secondary to extensive atrophy and leukomalacia from prior brain surgery/radiation Needing annual head ct for f/u #apml Dx'ed 12/2018; in remission 1/4-21/2021 chemotherapy trisenox   Family History  Problem Relation Age of Onset   Cancer Mother 32       lung    Hypertension Mother    Hypertension Father    Heart attack Father    Heart disease Father    Rheum arthritis Sister    Cancer Maternal Grandmother     Social History   Tobacco Use   Smoking status: Former    Packs/day: 1.00    Years: 3.00    Pack years: 3.00    Types: Cigarettes    Quit date: 04/10/1957    Years since quitting: 63.7   Smokeless tobacco: Never  Vaping Use   Vaping Use: Never used  Substance Use Topics   Alcohol use: Yes    Comment: maybe 2 beers twice a month    Drug use: No    Allergies  Allergen Reactions   Ezetimibe Other (See Comments)    Unknown reaction   Simvastatin Other (See Comments)    Reaction:  Gave pt a fever  Fever - temp of 103.   In 2003    Review of Systems: ROS All Other ROS was negative, except mentioned above   Past Medical History:  Diagnosis Date   Anginal pain  (HCC)    Arthritis    mostly hands   Benign familial hematuria    BPH (benign prostatic hypertrophy)    Brain cancer (HCC)    melanoma with brain met   Coronary artery disease    GERD (gastroesophageal reflux disease)    High cholesterol    Hypertension    Hypothyroidism    Intracerebral hemorrhage (HCC)    Melanoma ([place])    Metastatic melanoma to lung (HCC)    Mood disorder ([place])    Myocardial infarction [facility])    Pacemaker    due to syncope, 3rd degree HB (upgrade to [employer] Jude CRT-P 02/25/13 (Dr. Uvaldo Rising)   Rectal bleed    due to NSAIDS   Renal disorder    Sepsis ([place]) 12/09/2018   Skin cancer    melanoma   Sleep apnea    uses CPAP   Steroid-induced hyperglycemia    Stroke ([place])        Scheduled Meds:  allopurinol  200  mg Per Tube Daily   aspirin  81 mg Per Tube Daily   Chlorhexidine Gluconate Cloth  6 each Topical Daily   enoxaparin (LOVENOX) injection  30 mg Subcutaneous Q24H   feeding supplement (PROSource TF)  45 mL Per Tube BID   free water  300 mL Per Tube Q6H   insulin aspart  0-15 Units Subcutaneous Q4H   insulin aspart  0-5 Units Subcutaneous QHS   insulin detemir  10 Units Subcutaneous BID   lactulose  30 g Per Tube Daily   levothyroxine  137 mcg Per Tube Q0600   lidocaine (PF)  5 mL Other Once   metoprolol tartrate  37.5 mg Per Tube BID   tamsulosin  0.4 mg Oral Daily   valproic acid  500 mg Per Tube BID   And   valproic acid  250 mg Per Tube Q24H   Continuous Infusions:  sodium chloride Stopped (12/28/20 0843)   feeding supplement (OSMOLITE 1.5 CAL) 1,000 mL (12/30/20 1106)   lacosamide (VIMPAT) IV 150 mg (12/30/20 1317)   piperacillin-tazobactam (ZOSYN)  IV 3.375 g (12/30/20 0852)   vancomycin 1,000 mg (12/30/20 1406)   PRN Meds:.acetaminophen **OR** acetaminophen, albuterol, colchicine, midazolam, ondansetron **OR** ondansetron (ZOFRAN) IV   OBJECTIVE: Blood pressure 121/72, pulse 92, temperature 98.6 F (37 C), temperature source Axillary,  resp. rate 20, weight 91.7 kg, SpO2 94 %.  Physical Exam General/constitutional: no distress; sleeping, not able to arouse; bed with soft foam cushion HEENT: left frontal craniotomy scar; per/conj clear; oropharynx clear Neck supple CV: rrr no mrg Lungs: clear to auscultation, normal respiratory effort Abd: Soft, Nontender Ext: no edema Skin: No Rash or cellulitic change left UE Neuro: nonfocal MSK: left knee scar but joint without warmth/swelling or superficial erythema.    Central line presence: no    Lab Results Lab Results  Component Value Date   WBC 8.9 12/30/2020   HGB 13.8 12/30/2020   HCT 42.7 12/30/2020   MCV 96.8 12/30/2020   PLT 151 12/30/2020    Lab Results  Component Value Date   CREATININE 2.20 (H) 12/30/2020   BUN 53 (H) 12/30/2020   NA 138 12/30/2020   K 4.4 12/30/2020   CL 104 12/30/2020   CO2 24 12/30/2020    Lab Results  Component Value Date   ALT 14 12/30/2020   AST 19 12/30/2020   ALKPHOS 41 12/30/2020   BILITOT 0.9 12/30/2020      Microbiology: Recent Results (from the past 240 hour(s))  Resp Panel by RT-PCR (Flu A&B, Covid) Nasopharyngeal Swab     Status: None   Collection Time: 12/21/20  4:34 PM   Specimen: Nasopharyngeal Swab; Nasopharyngeal(NP) swabs in vial transport medium  Result Value Ref Range Status   SARS Coronavirus 2 by RT PCR NEGATIVE NEGATIVE Final    Comment: (NOTE) SARS-CoV-2 target nucleic acids are NOT DETECTED.  The SARS-CoV-2 RNA is generally detectable in upper respiratory specimens during the acute phase of infection. The lowest concentration of SARS-CoV-2 viral copies this assay can detect is 138 copies/mL. A negative result does not preclude SARS-Cov-2 infection and should not be used as the sole basis for treatment or other patient management decisions. A negative result may occur with  improper specimen collection/handling, submission of specimen other than nasopharyngeal swab, presence of viral  mutation(s) within the areas targeted by this assay, and inadequate number of viral copies(<138 copies/mL). A negative result must be combined with clinical observations, patient history, and epidemiological information. The expected result  is Negative.  Fact Sheet for Patients:  EntrepreneurPulse.com.au  Fact Sheet for Healthcare Providers:  IncredibleEmployment.be  This test is no t yet approved or cleared by the [place] FDA and  has been authorized for detection and/or diagnosis of SARS-CoV-2 by FDA under an Emergency Use Authorization (EUA). This EUA will remain  in effect (meaning this test can be used) for the duration of the COVID-19 declaration under Section 564(b)(1) of the Act, 21 U.S.C.section 360bbb-3(b)(1), unless the authorization is terminated  or revoked sooner.       Influenza A by PCR NEGATIVE NEGATIVE Final   Influenza B by PCR NEGATIVE NEGATIVE Final    Comment: (NOTE) The Xpert Xpress SARS-CoV-2/FLU/RSV plus assay is intended as an aid in the diagnosis of influenza from Nasopharyngeal swab specimens and should not be used as a sole basis for treatment. Nasal washings and aspirates are unacceptable for Xpert Xpress SARS-CoV-2/FLU/RSV testing.  Fact Sheet for Patients: EntrepreneurPulse.com.au  Fact Sheet for Healthcare Providers: IncredibleEmployment.be  This test is not yet approved or cleared by the [place] FDA and has been authorized for detection and/or diagnosis of SARS-CoV-2 by FDA under an Emergency Use Authorization (EUA). This EUA will remain in effect (meaning this test can be used) for the duration of the COVID-19 declaration under Section 564(b)(1) of the Act, 21 U.S.C. section 360bbb-3(b)(1), unless the authorization is terminated or revoked.  Performed at [facility], [place] [address] [place], [place] [address]   MRSA Next Gen by PCR, Nasal      Status: Abnormal   Collection Time: 12/21/20  4:36 PM   Specimen: Nasopharyngeal Swab; Nasal Swab  Result Value Ref Range Status   MRSA by PCR Next Gen NEGATIVE (A) NOT DETECTED Final    Comment:        The GeneXpert MRSA Assay (FDA approved for NASAL specimens only), is one component of a comprehensive MRSA colonization surveillance program. It is not intended to diagnose MRSA infection nor to guide or monitor treatment for MRSA infections. Performed at [facility], [place] [address] [place], [place] [address]   Culture, blood (routine x 2)     Status: Abnormal   Collection Time: 12/21/20  5:13 PM   Specimen: BLOOD RIGHT HAND  Result Value Ref Range Status   Specimen Description BLOOD RIGHT HAND  Final   Special Requests   Final    BOTTLES DRAWN AEROBIC ONLY Blood Culture results may not be optimal due to an inadequate volume of blood received in culture bottles   Culture  Setup Time   Final    GRAM POSITIVE COCCI IN CLUSTERS AEROBIC BOTTLE ONLY CRITICAL RESULT CALLED TO, READ BACK BY AND VERIFIED WITH: PHARMD ALEX L 1305 [id number] FCP    Culture (A)  Final    STAPHYLOCOCCUS EPIDERMIDIS THE SIGNIFICANCE OF ISOLATING THIS ORGANISM FROM A SINGLE SET OF BLOOD CULTURES WHEN MULTIPLE SETS ARE DRAWN IS UNCERTAIN. PLEASE NOTIFY THE MICROBIOLOGY DEPARTMENT WITHIN ONE WEEK IF SPECIATION AND SENSITIVITIES ARE REQUIRED. Performed at [facility], [place] [address], [place],  [address]    Report Status 12/24/2020 FINAL  Final  Blood Culture ID Panel (Reflexed)     Status: Abnormal   Collection Time: 12/21/20  5:13 PM  Result Value Ref Range Status   Enterococcus faecalis NOT DETECTED NOT DETECTED Final   Enterococcus Faecium NOT DETECTED NOT DETECTED Final   Listeria monocytogenes NOT DETECTED NOT DETECTED Final   Staphylococcus species DETECTED (A) NOT  DETECTED Final    Comment: CRITICAL RESULT CALLED TO, READ BACK BY AND VERIFIED WITH: PHARMD ALEX L 1305 [id number] FCP     Staphylococcus aureus (BCID) NOT DETECTED NOT DETECTED Final   Staphylococcus epidermidis DETECTED (A) NOT DETECTED Final    Comment: Methicillin (oxacillin) resistant coagulase negative staphylococcus. Possible blood culture contaminant (unless isolated from more than one blood culture draw or clinical case suggests pathogenicity). No antibiotic treatment is indicated for blood  culture contaminants. CRITICAL RESULT CALLED TO, READ BACK BY AND VERIFIED WITH: PHARMD ALEX L 1305 [id number] FCP    Staphylococcus lugdunensis NOT DETECTED NOT DETECTED Final   Streptococcus species NOT DETECTED NOT DETECTED Final   Streptococcus agalactiae NOT DETECTED NOT DETECTED Final   Streptococcus pneumoniae NOT DETECTED NOT DETECTED Final   Streptococcus pyogenes NOT DETECTED NOT DETECTED Final   A.calcoaceticus-baumannii NOT DETECTED NOT DETECTED Final   Bacteroides fragilis NOT DETECTED NOT DETECTED Final   Enterobacterales NOT DETECTED NOT DETECTED Final   Enterobacter cloacae complex NOT DETECTED NOT DETECTED Final   Escherichia coli NOT DETECTED NOT DETECTED Final   Klebsiella aerogenes NOT DETECTED NOT DETECTED Final   Klebsiella oxytoca NOT DETECTED NOT DETECTED Final   Klebsiella pneumoniae NOT DETECTED NOT DETECTED Final   Proteus species NOT DETECTED NOT DETECTED Final   Salmonella species NOT DETECTED NOT DETECTED Final   Serratia marcescens NOT DETECTED NOT DETECTED Final   Haemophilus influenzae NOT DETECTED NOT DETECTED Final   Neisseria meningitidis NOT DETECTED NOT DETECTED Final   Pseudomonas aeruginosa NOT DETECTED NOT DETECTED Final   Stenotrophomonas maltophilia NOT DETECTED NOT DETECTED Final   Candida albicans NOT DETECTED NOT DETECTED Final   Candida auris NOT DETECTED NOT DETECTED Final   Candida glabrata NOT DETECTED NOT DETECTED Final   Candida krusei NOT DETECTED NOT DETECTED Final   Candida parapsilosis NOT DETECTED NOT DETECTED Final   Candida tropicalis NOT DETECTED NOT  DETECTED Final   Cryptococcus neoformans/gattii NOT DETECTED NOT DETECTED Final   Methicillin resistance mecA/C DETECTED (A) NOT DETECTED Final    Comment: CRITICAL RESULT CALLED TO, READ BACK BY AND VERIFIED WITH: PHARMD ALEX L 1102 [id number] FCP Performed at [facility], [address] [place], [place] [address]   Culture, blood (routine x 2)     Status: None   Collection Time: 12/21/20  5:45 PM   Specimen: BLOOD RIGHT ARM  Result Value Ref Range Status   Specimen Description BLOOD RIGHT ARM  Final   Special Requests   Final    BOTTLES DRAWN AEROBIC ONLY Blood Culture adequate volume   Culture   Final    NO GROWTH 5 DAYS Performed at [facility], [place] [address], [place], [place] [address]    Report Status 12/26/2020 FINAL  Final  Urine Culture     Status: Abnormal   Collection Time: 12/22/20  3:40 AM   Specimen: Urine, Clean Catch  Result Value Ref Range Status   Specimen Description URINE, CLEAN CATCH  Final   Special Requests NONE  Final   Culture (A)  Final    <10,000 COLONIES/mL INSIGNIFICANT GROWTH Performed at [facility], [place] [address] [place], [place] [address]    Report Status 12/23/2020 FINAL  Final  Culture, blood (routine x 2)     Status: None   Collection Time: 12/24/20 11:39 AM   Specimen: BLOOD LEFT HAND  Result Value Ref Range Status   Specimen Description BLOOD LEFT HAND  Final  Special Requests BOTTLES DRAWN AEROBIC ONLY  Final   Culture   Final    NO GROWTH 5 DAYS Performed at [facility], [place] [address] [place], [place] [address]    Report Status 12/29/2020 FINAL  Final  Culture, blood (routine x 2)     Status: None   Collection Time: 12/24/20 11:49 AM   Specimen: BLOOD RIGHT HAND  Result Value Ref Range Status   Specimen Description BLOOD RIGHT HAND  Final   Special Requests BOTTLES DRAWN AEROBIC ONLY  Final   Culture   Final    NO GROWTH 5 DAYS Performed at [facility], [place] [address] [place], [place]  [address]    Report Status 12/29/2020 FINAL  Final  CSF culture w Gram Stain     Status: None   Collection Time: 12/24/20  6:19 PM   Specimen: CSF; Cerebrospinal Fluid  Result Value Ref Range Status   Specimen Description CSF  Final   Special Requests Normal  Final   Gram Stain NO ORGANISMS SEEN NO WBC SEEN CYTOSPIN SMEAR   Final   Culture   Final    NO GROWTH 3 DAYS Performed at [facility], [place] [address] [place], [place] [address]    Report Status 12/28/2020 FINAL  Final  Culture, blood (routine x 2)     Status: None (Preliminary result)   Collection Time: 12/29/20  7:23 AM   Specimen: BLOOD  Result Value Ref Range Status   Specimen Description BLOOD LEFT ANTECUBITAL  Final   Special Requests   Final    BOTTLES DRAWN AEROBIC AND ANAEROBIC Blood Culture adequate volume   Culture  Setup Time   Final    GRAM POSITIVE COCCI IN BOTH AEROBIC AND ANAEROBIC BOTTLES CRITICAL VALUE NOTED.  VALUE IS CONSISTENT WITH PREVIOUSLY REPORTED AND CALLED VALUE.    Culture   Final    NO GROWTH 1 DAY Performed at [facility], [place] [address], [place], [place] [address]    Report Status PENDING  Incomplete  Culture, blood (routine x 2)     Status: None (Preliminary result)   Collection Time: 12/29/20  7:24 AM   Specimen: BLOOD LEFT HAND  Result Value Ref Range Status   Specimen Description BLOOD LEFT HAND  Final   Special Requests   Final    BOTTLES DRAWN AEROBIC AND ANAEROBIC Blood Culture adequate volume   Culture   Final    NO GROWTH 1 DAY Performed at [facility], [place] [address] [place], [place] [address]    Report Status PENDING  Incomplete  Resp Panel by RT-PCR (Flu A&B, Covid) Nasopharyngeal Swab     Status: None   Collection Time: 12/29/20  8:49 AM   Specimen: Nasopharyngeal Swab; Nasopharyngeal(NP) swabs in vial transport medium  Result Value Ref Range Status   SARS Coronavirus 2 by RT PCR NEGATIVE NEGATIVE Final    Comment: (NOTE) SARS-CoV-2 target nucleic  acids are NOT DETECTED.  The SARS-CoV-2 RNA is generally detectable in upper respiratory specimens during the acute phase of infection. The lowest concentration of SARS-CoV-2 viral copies this assay can detect is 138 copies/mL. A negative result does not preclude SARS-Cov-2 infection and should not be used as the sole basis for treatment or other patient management decisions. A negative result may occur with  improper specimen collection/handling, submission of specimen other than nasopharyngeal swab, presence of viral mutation(s) within the areas targeted by this assay, and inadequate  number of viral copies(<138 copies/mL). A negative result must be combined with clinical observations, patient history, and epidemiological information. The expected result is Negative.  Fact Sheet for Patients:  EntrepreneurPulse.com.au  Fact Sheet for Healthcare Providers:  IncredibleEmployment.be  This test is no t yet approved or cleared by the [place] FDA and  has been authorized for detection and/or diagnosis of SARS-CoV-2 by FDA under an Emergency Use Authorization (EUA). This EUA will remain  in effect (meaning this test can be used) for the duration of the COVID-19 declaration under Section 564(b)(1) of the Act, 21 U.S.C.section 360bbb-3(b)(1), unless the authorization is terminated  or revoked sooner.       Influenza A by PCR NEGATIVE NEGATIVE Final   Influenza B by PCR NEGATIVE NEGATIVE Final    Comment: (NOTE) The Xpert Xpress SARS-CoV-2/FLU/RSV plus assay is intended as an aid in the diagnosis of influenza from Nasopharyngeal swab specimens and should not be used as a sole basis for treatment. Nasal washings and aspirates are unacceptable for Xpert Xpress SARS-CoV-2/FLU/RSV testing.  Fact Sheet for Patients: EntrepreneurPulse.com.au  Fact Sheet for Healthcare Providers: IncredibleEmployment.be  This  test is not yet approved or cleared by the [place] FDA and has been authorized for detection and/or diagnosis of SARS-CoV-2 by FDA under an Emergency Use Authorization (EUA). This EUA will remain in effect (meaning this test can be used) for the duration of the COVID-19 declaration under Section 564(b)(1) of the Act, 21 U.S.C. section 360bbb-3(b)(1), unless the authorization is terminated or revoked.  Performed at [facility], [place] [address], [place], [place] [address]   Urine Culture     Status: None   Collection Time: 12/29/20 10:48 AM   Specimen: Urine, Catheterized  Result Value Ref Range Status   Specimen Description URINE, CATHETERIZED  Final   Special Requests NONE  Final   Culture   Final    NO GROWTH Performed at [facility], [address] [place], [place] [address]    Report Status 12/30/2020 FINAL  Final     Serology:    Imaging: If present, new imagings (plain films, ct scans, and mri) have been personally visualized and interpreted; radiology reports have been reviewed. Decision making incorporated into the Impression / Recommendations.  9/22 ct left elbow pending  9/22 LE duplex Negative for pe bilaterally  9/21 chest xray 1. Support apparatus, as above. 2. Elevated right hemidiaphragm, similar to prior studies. 3. Low lung volumes without radiographic evidence of acute cardiopulmonary disease. 4. Aortic atherosclerosis.  9/18 ct head 1. No evidence of acute intracranial abnormality. 2. Low density in the right middle cerebellar peduncle on the prior CT is no longer evident and was likely artifactual.  9/17 xray chest No acute pathology  9/16 tte  1. Left ventricular ejection fraction, by estimation, is 55 to 60%. The  left ventricle has normal function. Left ventricular endocardial border  not optimally defined to evaluate regional wall motion. There is mild  concentric left ventricular  hypertrophy. Left ventricular  diastolic parameters are indeterminate.   2. Right ventricular systolic function is normal. The right ventricular  size is not well visualized. Tricuspid regurgitation signal is inadequate  for assessing PA pressure.   3. The mitral valve is grossly normal. No evidence of mitral valve  regurgitation.   4. The aortic valve was not well visualized. Aortic valve regurgitation  is not visualized.   5. The inferior vena cava is normal in size with greater than  50%  respiratory variability, suggesting right atrial pressure of 3 mmHg.   9/16 abd xray 1. Feeding tube tip is periportal or can could be in the distal stomach antrum or duodenal bulb. 2. Pacer device noted.  Jabier Mutton, [place] [facility] [place] [phone] pager    12/30/2020, 3:39 PM

## 2020-12-30 NOTE — Progress Notes (Signed)
Inpatient Diabetes Program Recommendations  AACE/ADA: New Consensus Statement on Inpatient Glycemic Control (2015)  Target Ranges:  Prepandial:   less than 140 mg/dL      Peak postprandial:   less than 180 mg/dL (1-2 hours)      Critically ill patients:  140 - 180 mg/dL   Lab Results  Component Value Date   GLUCAP 190 (H) 12/30/2020   HGBA1C 5.8 (H) 12/24/2020    Review of Glycemic Control Results for David [name], David [name] (MRN [id number]) as of 12/30/2020 10:01  Ref. Range 12/29/2020 21:40 12/29/2020 23:10 12/30/2020 03:13 12/30/2020 07:32  Glucose-Capillary Latest Ref Range: 70 - 99 mg/dL 192 (H) 253 (H) 230 (H) 190 (H)   Diabetes history: no hx noted Outpatient Diabetes medications: none Current orders for Inpatient glycemic control: Levemir 10 units BID, Novolog 0-15 units Q4H Osmolite @ 60 ml/hr Inpatient Diabetes Program Recommendations:    Consider: -Discontinuing Novolog 0-5 units QHS -Adding Novolog 2 units Q4H for tube feed coverage (to be stopped or held in the event tube feeds are held).   Thanks, Bronson Curb, MSN, RNC-OB Diabetes Coordinator [phone] (8a-5p)

## 2020-12-30 NOTE — Progress Notes (Signed)
Pharmacy Antibiotic Note  David [name]. is a 80 y.o. male admitted on 12/14/2020 with focal seizures.  Hospitalization has been complicated by ongoing encephalopathy.  Pharmacy has been consulted for Vancomycin and Zosyn dosing for possible aspiration pneumonia with GPC in 2/4 bottles. Of note, pt was recently on Unasyn for presumed aspiration pneumonia.  Patient has some baseline renal dysfunction (baseline SCr 2) with current SCr 2.2  Plan: Vancomycin 1750mg  IV x 1 dose Followed by 1000 mg IV q48h (eAUC 427, Scr 2.2) Goal AUC 400-550  Zosyn 3.375 gm IV q8h (4 hour infusion). Monitor renal function, micro data, and clinical status. Vancomycin levels as indicated   Weight: 91.7 kg (202 lb 2.6 oz)  Temp (24hrs), Avg:99.7 F (37.6 C), Min:98.6 F (37 C), Max:100.7 F (38.2 C)  Recent Labs  Lab 12/26/20 0740 12/27/20 0551 12/28/20 0249 12/29/20 0331 12/30/20 0305  WBC 8.1 7.2 7.4 8.6 8.9  CREATININE 2.72* 2.40* 1.87* 1.83* 2.20*     Estimated Creatinine Clearance: 28.9 mL/min (A) (by C-G formula based on SCr of 2.2 mg/dL (H)).    Allergies  Allergen Reactions   Ezetimibe Other (See Comments)    Unknown reaction   Simvastatin Other (See Comments)    Reaction:  Gave pt a fever  Fever - temp of 103.   In 2003    Antimicrobials this admission: CTX x 1 9/13 Unasyn 9/14 > 9/17 Vanc x 1 9/16 Vanc x 1 9/21 Zosyn 9/21 >> Vanc 9/21 >>  Dose adjustments this admission:   Microbiology results: 9/13 MRSA PCR: neg 9/13 Bcx x2:  1/2 GPC =Staph epidermidis/ Final  9/14 Ucx: insig / final 9/16 Bcx: x2: neg 9/16: CSF: neg 9/21: BCx: GPC 9/21 UCx: neg  Thank you for allowing pharmacy to be a part of this patient's care.  Lestine Box, PharmD PGY2 Infectious Diseases Pharmacy Resident   Please check [email or web address] for unit-specific pharmacy phone numbers

## 2020-12-30 NOTE — Progress Notes (Signed)
Left upper extremity venous duplex completed. Refer to "CV Proc" under chart review to view preliminary results.  12/30/2020 5:01 PM Kelby Aline., MHA, RVT, RDCS, RDMS

## 2020-12-30 NOTE — Progress Notes (Signed)
Neurology Progress Note   S:// Seen and examined.  No acute overnight events reported.   O:// Current vital signs: BP 124/76 (BP Location: Left Arm)   Pulse 91   Temp 99.7 F (37.6 C) (Oral)   Resp 18   Wt 91.7 kg   SpO2 93%   BMI 30.74 kg/m  Vital signs in last 24 hours: Temp:  [98.3 F (36.8 C)-100.7 F (38.2 C)] 99.7 F (37.6 C) (09/22 0732) Pulse Rate:  [87-105] 91 (09/22 0732) Resp:  [14-20] 18 (09/22 0732) BP: (107-157)/(59-95) 124/76 (09/22 0732) SpO2:  [92 %-94 %] 93 % (09/22 0732) General: Laying in bed, NG tube in place, NAD CVS: Regular rate rhythm HEENT: Normocephalic/atraumatic Respiratory: Rales bilaterally  Neurological examination He is awake, alert He tracks examiner but does not verbalize Does not follow complex commands, follows simple commands intermittently/inconsistently. Cranial nerves: Pupils appear equal round and reactive to light, no gaze preference or deviation, mild left ptosis, blinks to threat from both sides, face appears grossly symmetric. Motor examination: Continued right hemiparesis with somewhat stronger left side but hard to assess individual muscle strength due to inability to comprehend. Sensation: As above  Medications  Current Facility-Administered Medications:    0.9 %  sodium chloride infusion, , Intravenous, Continuous, Ghimire, Henreitta Leber, MD, Stopped at 12/28/20 [phone]   acetaminophen (TYLENOL) tablet 650 mg, 650 mg, Per Tube, Q6H PRN, 650 mg at 12/29/20 2327 **OR** acetaminophen (TYLENOL) suppository 650 mg, 650 mg, Rectal, Q6H PRN, Ghimire, Henreitta Leber, MD   albuterol (PROVENTIL) (2.5 MG/3ML) 0.083% nebulizer solution 2.5 mg, 2.5 mg, Nebulization, Q6H PRN, Regalado, Belkys A, MD, 2.5 mg at 12/21/20 1710   allopurinol (ZYLOPRIM) tablet 200 mg, 200 mg, Per Tube, Daily, Ghimire, Shanker M, MD, 200 mg at 12/29/20 1126   aspirin chewable tablet 81 mg, 81 mg, Per Tube, Daily, Ghimire, Henreitta Leber, MD, 81 mg at 12/29/20 1126    Chlorhexidine Gluconate Cloth 2 % PADS 6 each, 6 each, Topical, Daily, Ghimire, Henreitta Leber, MD, 6 each at 12/28/20 1023   colchicine tablet 0.6 mg, 0.6 mg, Per Tube, Daily PRN, Ghimire, Shanker M, MD   enoxaparin (LOVENOX) injection 30 mg, 30 mg, Subcutaneous, Q24H, Yadav, Priyanka O, MD, 30 mg at 12/29/20 2122   feeding supplement (OSMOLITE 1.5 CAL) liquid 1,000 mL, 1,000 mL, Per Tube, Continuous, Ghimire, Henreitta Leber, MD, Last Rate: 60 mL/hr at 12/29/20 1712, 1,000 mL at 12/29/20 1712   feeding supplement (PROSource TF) liquid 45 mL, 45 mL, Per Tube, BID, Ghimire, Shanker M, MD, 45 mL at 12/29/20 2121   free water 300 mL, 300 mL, Per Tube, Q6H, Ghimire, Shanker M, MD   insulin aspart (novoLOG) injection 0-15 Units, 0-15 Units, Subcutaneous, Q4H, Crosley, Debby, MD, 5 Units at 12/30/20 0317   insulin aspart (novoLOG) injection 0-5 Units, 0-5 Units, Subcutaneous, QHS, Crosley, Debby, MD   insulin detemir (LEVEMIR) injection 10 Units, 10 Units, Subcutaneous, BID, Ghimire, Henreitta Leber, MD, 10 Units at 12/29/20 2122   lacosamide (VIMPAT) 150 mg in sodium chloride 0.9 % 25 mL IVPB, 150 mg, Intravenous, Q12H, Lora Havens, MD, Stopped at 12/29/20 2147   lactulose (CHRONULAC) 10 GM/15ML solution 30 g, 30 g, Per Tube, Daily, Ghimire, Henreitta Leber, MD   levothyroxine (SYNTHROID) tablet 137 mcg, 137 mcg, Per Tube, Q0600, Jonetta Osgood, MD, 137 mcg at 12/29/20 0602   lidocaine (PF) (XYLOCAINE) 1 % injection 5 mL, 5 mL, Other, Once, Lora Havens, MD   metoprolol tartrate (LOPRESSOR) 25  mg/10 mL oral suspension 37.5 mg, 37.5 mg, Per Tube, BID, Ghimire, Henreitta Leber, MD, 37.5 mg at 12/29/20 2121   midazolam (VERSED) injection 2 mg, 2 mg, Intravenous, Q4H PRN, Lora Havens, MD   ondansetron (ZOFRAN) tablet 4 mg, 4 mg, Per Tube, Q6H PRN **OR** ondansetron (ZOFRAN) injection 4 mg, 4 mg, Intravenous, Q6H PRN, Ghimire, Henreitta Leber, MD   piperacillin-tazobactam (ZOSYN) IVPB 3.375 g, 3.375 g, Intravenous, Q8H,  Hammons, Kimberly B, RPH, Last Rate: 12.5 mL/hr at 12/30/20 0017, 3.375 g at 12/30/20 0017   tamsulosin (FLOMAX) capsule 0.4 mg, 0.4 mg, Oral, Daily, Ghimire, Shanker M, MD, 0.4 mg at 12/29/20 1126   valproic acid (DEPAKENE) 250 MG/5ML solution 500 mg, 500 mg, Per Tube, BID, 500 mg at 12/29/20 2120 **AND** valproic acid (DEPAKENE) 250 MG/5ML solution 250 mg, 250 mg, Per Tube, Q24H, Lora Havens, MD, 250 mg at 12/29/20 1834  Facility-Administered Medications Ordered in Other Encounters:    0.9 %  sodium chloride infusion, 1,000 mL, Intravenous, Once, [name], MD Labs CBC    Component Value Date/Time   WBC 8.9 12/30/2020 0305   RBC 4.41 12/30/2020 0305   HGB 13.8 12/30/2020 0305   HGB 14.4 02/27/2020 1242   HGB 15.8 03/26/2017 0928   HCT 42.7 12/30/2020 0305   HCT 42.9 02/27/2020 1242   HCT 46.9 03/26/2017 0928   PLT 151 12/30/2020 0305   PLT 192 02/27/2020 1242   MCV 96.8 12/30/2020 0305   MCV 93 02/27/2020 1242   MCV 92.6 03/26/2017 0928   MCH 31.3 12/30/2020 0305   MCHC 32.3 12/30/2020 0305   RDW 13.9 12/30/2020 0305   RDW 14.2 02/27/2020 1242   RDW 14.2 03/26/2017 0928   LYMPHSABS 0.7 12/17/2020 2048   LYMPHSABS 0.6 (L) 03/26/2017 0928   MONOABS 0.5 12/17/2020 2048   MONOABS 0.6 03/26/2017 0928   EOSABS 0.3 12/17/2020 2048   EOSABS 0.3 03/26/2017 0928   BASOSABS 0.0 12/17/2020 2048   BASOSABS 0.0 03/26/2017 0928    CMP     Component Value Date/Time   NA 138 12/30/2020 0305   NA 140 02/27/2020 1242   NA 140 03/26/2017 0928   K 4.4 12/30/2020 0305   K 4.7 03/26/2017 0928   CL 104 12/30/2020 0305   CO2 24 12/30/2020 0305   CO2 21 (L) 03/26/2017 0928   GLUCOSE 230 (H) 12/30/2020 0305   GLUCOSE 95 03/26/2017 0928   BUN 53 (H) 12/30/2020 0305   BUN 33 (H) 02/27/2020 1242   BUN 19.3 03/26/2017 0928   CREATININE 2.20 (H) 12/30/2020 0305   CREATININE 1.77 (H) 05/24/2018 1445   CREATININE 1.2 03/26/2017 0928   CALCIUM 7.9 (L) 12/30/2020 0305   CALCIUM 8.7  03/26/2017 0928   PROT 5.0 (L) 12/30/2020 0305   PROT 6.8 03/26/2017 0928   ALBUMIN 1.9 (L) 12/30/2020 0305   ALBUMIN 3.7 03/26/2017 0928   AST 19 12/30/2020 0305   AST 18 05/24/2018 1445   AST 22 03/26/2017 0928   ALT 14 12/30/2020 0305   ALT 13 05/24/2018 1445   ALT 15 03/26/2017 0928   ALKPHOS 41 12/30/2020 0305   ALKPHOS 58 03/26/2017 0928   BILITOT 0.9 12/30/2020 0305   BILITOT 0.6 05/24/2018 1445   BILITOT 0.58 03/26/2017 0928   GFRNONAA 29 (L) 12/30/2020 0305   GFRNONAA 36 (L) 05/24/2018 1445   GFRAA 46 (L) 02/27/2020 1242   GFRAA 42 (L) 05/24/2018 1445    Imaging I have reviewed images in epic  and the results pertinent to this consultation are: No new imaging  Assessment: 80 year old man prior history of melanoma with mets to the brain-left frontal lobe complicated by hemorrhage in 2017 status postresection, also with history of bone mets who presented with progressive and intermittent aphasia and confusion ongoing for the last nearly couple of weeks now.EEG showed seizures arising from left central temporal region which had been difficult to control with escalating doses of antiepileptics. With multiple antiepileptics, continue to show some improvement but then had deterioration in terms of his mentation and worsening of his aphasia. Possible aspiration as well as toxic metabolic derangements, which are currently being corrected but he does not seem today to be any different than yesterday. One of the concerns was long-acting benzodiazepine clobazam that was discontinued few days ago due to concerns for side effects in the setting of renal dysfunction-a level has been sent-turnaround time is 4 to 10 days.  While this could be contributing, I do not anticipate it causing this amount of encephalopathy.  Impression: Focal seizures with alteration of awareness-fluctuating Metastatic melanoma with brain mets and bone mets Prior history of hemorrhagic mets to the brain Right  hemiparesis Multifactorial toxic metabolic encephalopathy  Recommendations: We will continue current antiepileptic regimen for now Correction of toxic metabolic derangements per primary team as you are. Will continue to follow.   -- Amie Portland, MD Neurologist [facility] Pager: [phone]

## 2020-12-30 NOTE — Progress Notes (Signed)
BLE venous duplex has been completed.  Results can be found under chart review under CV PROC. 12/30/2020 11:26 AM Seven Dollens RVT, RDMS

## 2020-12-30 NOTE — Progress Notes (Signed)
Nutrition Follow-up  DOCUMENTATION CODES:   Non-severe (moderate) malnutrition in context of chronic illness  INTERVENTION:   -Continue TF via cortrak:  Osmolite 1.5 @ 60 ml/hr  45 ml Prosource TF BID  300 ml free water flush every 6 hours  Regimen provides 2240 kcals, 112 grams protein, and 1097 free water. Total free water: 2897 ml daily  NUTRITION DIAGNOSIS:   Moderate Malnutrition related to chronic illness (cancer) as evidenced by mild fat depletion, moderate fat depletion, mild muscle depletion, moderate muscle depletion, severe muscle depletion.  Ongoing  GOAL:   Patient will meet greater than or equal to 90% of their needs  Met with TF  MONITOR:   Labs, Weight trends, TF tolerance, I & O's  REASON FOR ASSESSMENT:   Consult Enteral/tube feeding initiation and management  ASSESSMENT:   Pt with PMH significant for metastatic melanoma with bone/lung/brain mets s/p brain radiation/L frontal craniotomy and tumor excision (July 2017), acute promyelocytic leukemia, hemorrhagic CVA, HTN, CAD s/p CABG 2014, AV block s/p PPM placement, OSA on CPAP, and depression presented to [facility] with aphasia and found to have seizures originating from L temporal region and transferred to [facility]. Subsequent hospital course complicated by encephalopathy and fever.  9/16- cortrak tube placed (gastric), TF initiated 9/21- continuous EEG discontinued  Reviewed I/O's: -1.8 L x 24 hours and -7.8 L since admission  UOP: 1.8 L x 24 hours   Pt sleeping soundly at time of visit. RD did not disturb.   Per MD notes, neurological condition unchanged. He remain very lethargic but will awaken briefly to loud stimuli.   Pt remains NPO and receiving TF via cortrak tube. Pt continues to tolerate TF well.   Medications reviewed and include lactulose.   Labs reviewed: CBGS: 185-235 (inpatient orders for glycemic control are 0-15 units insulin aspart every 4 hours. 0-5 units insulin aspart daily at  bedtime, and 10 units insulin detemir BID).    Diet Order:   Diet Order             Diet NPO time specified Except for: Sips with Meds  Diet effective now                   EDUCATION NEEDS:   Not appropriate for education at this time  Skin:  Skin Assessment: Reviewed RN Assessment  Last BM:  12/28/20  Height:   Ht Readings from Last 1 Encounters:  12/15/20 5' 8"  (1.727 m)    Weight:   Wt Readings from Last 1 Encounters:  12/27/20 91.7 kg   BMI:  Body mass index is 30.74 kg/m.  Estimated Nutritional Needs:   Kcal:  2200-2400  Protein:  110-130 grams  Fluid:  >2.2L/d    Loistine Chance, RD, LDN, [place] Registered Dietitian II Certified Diabetes Care and Education Specialist Please refer to [facility] for RD and/or RD on-call/weekend/after hours pager

## 2020-12-31 ENCOUNTER — Other Ambulatory Visit: Payer: Self-pay

## 2020-12-31 ENCOUNTER — Encounter (HOSPITAL_COMMUNITY): Payer: Self-pay

## 2020-12-31 ENCOUNTER — Inpatient Hospital Stay (HOSPITAL_COMMUNITY): Payer: Medicare Other

## 2020-12-31 DIAGNOSIS — R7881 Bacteremia: Secondary | ICD-10-CM

## 2020-12-31 LAB — CBC
HCT: 41.1 % (ref 39.0–52.0)
Hemoglobin: 13.8 g/dL (ref 13.0–17.0)
MCH: 32 pg (ref 26.0–34.0)
MCHC: 33.6 g/dL (ref 30.0–36.0)
MCV: 95.4 fL (ref 80.0–100.0)
Platelets: 178 10*3/uL (ref 150–400)
RBC: 4.31 MIL/uL (ref 4.22–5.81)
RDW: 13.8 % (ref 11.5–15.5)
WBC: 8.6 10*3/uL (ref 4.0–10.5)
nRBC: 0.2 % (ref 0.0–0.2)

## 2020-12-31 LAB — SYNOVIAL CELL COUNT + DIFF, W/ CRYSTALS
Crystals, Fluid: NONE SEEN
Eosinophils-Synovial: 0 % (ref 0–1)
Lymphocytes-Synovial Fld: 2 % (ref 0–20)
Monocyte-Macrophage-Synovial Fluid: 35 % — ABNORMAL LOW (ref 50–90)
Neutrophil, Synovial: 63 % — ABNORMAL HIGH (ref 0–25)
WBC, Synovial: 4440 /mm3 — ABNORMAL HIGH (ref 0–200)

## 2020-12-31 LAB — COMPREHENSIVE METABOLIC PANEL
ALT: 16 U/L (ref 0–44)
AST: 23 U/L (ref 15–41)
Albumin: 1.9 g/dL — ABNORMAL LOW (ref 3.5–5.0)
Alkaline Phosphatase: 42 U/L (ref 38–126)
Anion gap: 8 (ref 5–15)
BUN: 55 mg/dL — ABNORMAL HIGH (ref 8–23)
CO2: 25 mmol/L (ref 22–32)
Calcium: 7.7 mg/dL — ABNORMAL LOW (ref 8.9–10.3)
Chloride: 103 mmol/L (ref 98–111)
Creatinine, Ser: 2.18 mg/dL — ABNORMAL HIGH (ref 0.61–1.24)
GFR, Estimated: 30 mL/min — ABNORMAL LOW (ref >60)
Glucose, Bld: 218 mg/dL — ABNORMAL HIGH (ref 70–99)
Potassium: 4.7 mmol/L (ref 3.5–5.1)
Sodium: 136 mmol/L (ref 135–145)
Total Bilirubin: 0.9 mg/dL (ref 0.3–1.2)
Total Protein: 5.2 g/dL — ABNORMAL LOW (ref 6.5–8.1)

## 2020-12-31 LAB — GLUCOSE, CAPILLARY
Glucose-Capillary: 208 mg/dL — ABNORMAL HIGH (ref 70–99)
Glucose-Capillary: 172 mg/dL — ABNORMAL HIGH (ref 70–99)
Glucose-Capillary: 189 mg/dL — ABNORMAL HIGH (ref 70–99)
Glucose-Capillary: 196 mg/dL — ABNORMAL HIGH (ref 70–99)
Glucose-Capillary: 177 mg/dL — ABNORMAL HIGH (ref 70–99)
Glucose-Capillary: 191 mg/dL — ABNORMAL HIGH (ref 70–99)

## 2020-12-31 LAB — ECHOCARDIOGRAM LIMITED
S' Lateral: 2.7 cm
Weight: 3234.59 oz

## 2020-12-31 LAB — AMMONIA: Ammonia: 36 umol/L — ABNORMAL HIGH (ref 9–35)

## 2020-12-31 MED ORDER — LIDOCAINE HCL (PF) 1 % IJ SOLN
5.0000 mL | Freq: Once | INTRAMUSCULAR | Status: AC
  Administered 2020-12-31: 5 mL via INTRADERMAL

## 2020-12-31 NOTE — Progress Notes (Signed)
Neurology Progress Note   S:// Seen and examined this morning. More awake. Was able to tell me his name, tell me that he is feeling all right but had poor attention concentration to participate with full exam.   O:// Current vital signs: BP 140/79 (BP Location: Left Arm)   Pulse 87   Temp 98.7 F (37.1 C) (Axillary)   Resp (!) 22   Wt 91.7 kg   SpO2 94%   BMI 30.74 kg/m  Vital signs in last 24 hours: Temp:  [98.3 F (36.8 C)-99.7 F (37.6 C)] 98.7 F (37.1 C) (09/23 1102) Pulse Rate:  [85-96] 87 (09/23 1102) Resp:  [18-26] 22 (09/23 1102) BP: (119-140)/(71-79) 140/79 (09/23 1102) SpO2:  [93 %-96 %] 94 % (09/23 1102) General: Remains sleepy but awakens to voice. HEENT: Normocephalic/atraumatic Lungs: Bilateral rales CVS: Regular rate rhythm Neurological exam He was sleepy, awakens to voice Was able to tell me his name, was able to respond appropriately to questions such as how he is feeling to which he said "I am feeling fine" and not a question of if you are in pain to which he said "no I am not in pain". Is extremely poor attention concentration and kept falling asleep during this encounter. Cranial nerves: Pupils are equal round reactive light, extraocular movements intact, visual fields appear full to threat, facial symmetry difficult to ascertain but appears grossly symmetric. Motor examination: He does appear to have significant weakness on the right upper and lower extremity.  He is mildly weak on the left side to but the right-sided weakness is started-nearly 0-1/5 in the right upper and lower extremity. Left upper and lower extremity are 4+/5 Sensory exam: Grimaces to noxious stimulation more on the left and less on the right. Coordination: Cannot assess    Medications  Current Facility-Administered Medications:    0.9 %  sodium chloride infusion, , Intravenous, Continuous, Ghimire, Henreitta Leber, MD, Stopped at 12/28/20 [phone]   acetaminophen (TYLENOL) tablet 650  mg, 650 mg, Per Tube, Q6H PRN, 650 mg at 12/29/20 2327 **OR** acetaminophen (TYLENOL) suppository 650 mg, 650 mg, Rectal, Q6H PRN, Ghimire, Henreitta Leber, MD   albuterol (PROVENTIL) (2.5 MG/3ML) 0.083% nebulizer solution 2.5 mg, 2.5 mg, Nebulization, Q6H PRN, Regalado, Belkys A, MD, 2.5 mg at 12/21/20 1710   allopurinol (ZYLOPRIM) tablet 200 mg, 200 mg, Per Tube, Daily, Ghimire, Shanker M, MD, 200 mg at 12/31/20 1019   aspirin chewable tablet 81 mg, 81 mg, Per Tube, Daily, Ghimire, Shanker M, MD, 81 mg at 12/31/20 1019   ceFEPIme (MAXIPIME) 2 g in sodium chloride 0.9 % 100 mL IVPB, 2 g, Intravenous, Q24H, Vu, Trung T, MD, Last Rate: 200 mL/hr at 12/31/20 0025, 2 g at 12/31/20 0025   Chlorhexidine Gluconate Cloth 2 % PADS 6 each, 6 each, Topical, Daily, Ghimire, Henreitta Leber, MD, 6 each at 12/28/20 1023   colchicine tablet 0.6 mg, 0.6 mg, Per Tube, Daily PRN, Ghimire, Shanker M, MD   enoxaparin (LOVENOX) injection 30 mg, 30 mg, Subcutaneous, Q24H, Yadav, Priyanka O, MD, 30 mg at 12/30/20 2353   feeding supplement (OSMOLITE 1.5 CAL) liquid 1,000 mL, 1,000 mL, Per Tube, Continuous, Ghimire, Henreitta Leber, MD, Last Rate: 60 mL/hr at 12/31/20 0640, 1,000 mL at 12/31/20 0640   feeding supplement (PROSource TF) liquid 45 mL, 45 mL, Per Tube, BID, Ghimire, Shanker M, MD, 45 mL at 12/31/20 1019   free water 300 mL, 300 mL, Per Tube, Q6H, Ghimire, Henreitta Leber, MD, 300 mL at 12/31/20  1214   insulin aspart (novoLOG) injection 0-15 Units, 0-15 Units, Subcutaneous, Q4H, Crosley, Debby, MD, 3 Units at 12/31/20 1212   insulin aspart (novoLOG) injection 0-5 Units, 0-5 Units, Subcutaneous, QHS, Crosley, Debby, MD   insulin detemir (LEVEMIR) injection 10 Units, 10 Units, Subcutaneous, BID, Ghimire, Henreitta Leber, MD, 10 Units at 12/31/20 1022   lacosamide (VIMPAT) 150 mg in sodium chloride 0.9 % 25 mL IVPB, 150 mg, Intravenous, Q12H, Lora Havens, MD, Last Rate: 80 mL/hr at 12/31/20 1032, 150 mg at 12/31/20 1032   levothyroxine  (SYNTHROID) tablet 137 mcg, 137 mcg, Per Tube, Q0600, Jonetta Osgood, MD, 137 mcg at 12/31/20 0543   lidocaine (PF) (XYLOCAINE) 1 % injection 5 mL, 5 mL, Other, Once, Lora Havens, MD   metoprolol tartrate (LOPRESSOR) 25 mg/10 mL oral suspension 37.5 mg, 37.5 mg, Per Tube, BID, Ghimire, Henreitta Leber, MD, 37.5 mg at 12/31/20 1022   midazolam (VERSED) injection 2 mg, 2 mg, Intravenous, Q4H PRN, Lora Havens, MD   ondansetron (ZOFRAN) tablet 4 mg, 4 mg, Per Tube, Q6H PRN **OR** ondansetron (ZOFRAN) injection 4 mg, 4 mg, Intravenous, Q6H PRN, Ghimire, Henreitta Leber, MD   tamsulosin (FLOMAX) capsule 0.4 mg, 0.4 mg, Oral, Daily, Ghimire, Shanker M, MD, 0.4 mg at 12/29/20 1126   valproic acid (DEPAKENE) 250 MG/5ML solution 500 mg, 500 mg, Per Tube, BID, 500 mg at 12/31/20 1020 **AND** valproic acid (DEPAKENE) 250 MG/5ML solution 250 mg, 250 mg, Per Tube, Q24H, Yadav, Priyanka O, MD, 250 mg at 12/30/20 1420   vancomycin (VANCOCIN) IVPB 1000 mg/200 mL premix, 1,000 mg, Intravenous, Q48H, Willette Cluster, RPH, Last Rate: 200 mL/hr at 12/30/20 1406, 1,000 mg at 12/30/20 1406  Facility-Administered Medications Ordered in Other Encounters:    0.9 %  sodium chloride infusion, 1,000 mL, Intravenous, Once, [name], MD Labs CBC    Component Value Date/Time   WBC 8.6 12/31/2020 0351   RBC 4.31 12/31/2020 0351   HGB 13.8 12/31/2020 0351   HGB 14.4 02/27/2020 1242   HGB 15.8 03/26/2017 0928   HCT 41.1 12/31/2020 0351   HCT 42.9 02/27/2020 1242   HCT 46.9 03/26/2017 0928   PLT 178 12/31/2020 0351   PLT 192 02/27/2020 1242   MCV 95.4 12/31/2020 0351   MCV 93 02/27/2020 1242   MCV 92.6 03/26/2017 0928   MCH 32.0 12/31/2020 0351   MCHC 33.6 12/31/2020 0351   RDW 13.8 12/31/2020 0351   RDW 14.2 02/27/2020 1242   RDW 14.2 03/26/2017 0928   LYMPHSABS 0.7 12/17/2020 2048   LYMPHSABS 0.6 (L) 03/26/2017 0928   MONOABS 0.5 12/17/2020 2048   MONOABS 0.6 03/26/2017 0928   EOSABS 0.3 12/17/2020  2048   EOSABS 0.3 03/26/2017 0928   BASOSABS 0.0 12/17/2020 2048   BASOSABS 0.0 03/26/2017 0928    CMP     Component Value Date/Time   NA 136 12/31/2020 0351   NA 140 02/27/2020 1242   NA 140 03/26/2017 0928   K 4.7 12/31/2020 0351   K 4.7 03/26/2017 0928   CL 103 12/31/2020 0351   CO2 25 12/31/2020 0351   CO2 21 (L) 03/26/2017 0928   GLUCOSE 218 (H) 12/31/2020 0351   GLUCOSE 95 03/26/2017 0928   BUN 55 (H) 12/31/2020 0351   BUN 33 (H) 02/27/2020 1242   BUN 19.3 03/26/2017 0928   CREATININE 2.18 (H) 12/31/2020 0351   CREATININE 1.77 (H) 05/24/2018 1445   CREATININE 1.2 03/26/2017 0928   CALCIUM 7.7 (L)  12/31/2020 0351   CALCIUM 8.7 03/26/2017 0928   PROT 5.2 (L) 12/31/2020 0351   PROT 6.8 03/26/2017 0928   ALBUMIN 1.9 (L) 12/31/2020 0351   ALBUMIN 3.7 03/26/2017 0928   AST 23 12/31/2020 0351   AST 18 05/24/2018 1445   AST 22 03/26/2017 0928   ALT 16 12/31/2020 0351   ALT 13 05/24/2018 1445   ALT 15 03/26/2017 0928   ALKPHOS 42 12/31/2020 0351   ALKPHOS 58 03/26/2017 0928   BILITOT 0.9 12/31/2020 0351   BILITOT 0.6 05/24/2018 1445   BILITOT 0.58 03/26/2017 0928   GFRNONAA 30 (L) 12/31/2020 0351   GFRNONAA 36 (L) 05/24/2018 1445   GFRAA 46 (L) 02/27/2020 1242   GFRAA 42 (L) 05/24/2018 1445    glycosylated hemoglobin  Lipid Panel     Component Value Date/Time   CHOL 108 12/24/2020 1903   TRIG 161 (H) 12/24/2020 1903   HDL 34 (L) 12/24/2020 1903   CHOLHDL 3.2 12/24/2020 1903   VLDL 32 12/24/2020 1903   LDLCALC 42 12/24/2020 1903   LDLDIRECT 113.0 09/07/2020 1030     Imaging I have reviewed images in epic and the results pertinent to this consultation are: Most recent head CT-12/26/2020 with no evidence of acute intracranial abnormality, the low-density seen on the prior CT is no longer evident and was likely artifactual.  Extensive encephalomalacia in the left frontal lobe with exvacuodilatation of the left lateral ventricle and chronic white matter  disease.   CSF studies performed 12/24/2020 with mild elevated glucose, normal protein, normal cell count with no malignant cells seen on cytology.  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  EEG showed seizures emanating from the left central temporal cortex which had been difficult to control and required multiple antiepileptics. He continues to waxes and wane with his best exam for me this week today when he is at least able to have some conversation with me. Of note, he has significant right-sided hemiparesis, which has worsened over the past few days-on one of the interim CT scans of the head there was concern for right cerebellar infarction leading to this but that finding was likely artifactual as the follow-up CT head did not have any evidence of right cerebellar stroke. I am still not very clear on what or how his clinical picture is completely tied to 1 diagnosis but I would have to think that it is related to his large left hemispheric encephalomalacia causing left-sided hemiparesis and confusion and a hospital course that has been complicated by multiple medical issues.  Recommendations: Continue antiepileptics Continue correction of toxic metabolic derangements I spoke with the patient's daughter briefly yesterday outside of the hospital and that they are considering palliative care consultation which I think is appropriate at this time. Another CT head can be of obtained to see if there is any drastic change in comparison to imaging from a few weeks ago.  MRI brain with contrast would have been the preferred modality but given his incompatible pacemaker and deranged renal function, that is not possible at this time.  A CT with contrast is also not  possible given the deranged renal function.  -- Amie Portland, MD Neurologist [facility] Pager: [phone]

## 2020-12-31 NOTE — Progress Notes (Signed)
SLP Cancellation Note  Patient Details Name: David [name]. MRN: [id number] DOB: 21-Sep-1940   Cancelled treatment:       Reason Eval/Treat Not Completed: Fatigue/lethargy limiting ability to participate. Pt unable to sustain alertness/attention without constant verbal/tactile cues, though he did respond briefly and appropriately to simple questions. PO trials not appropriate this date. SLP to continue efforts.     [name], [place], [place] [facility] Office Number: [phone]  [name] 12/31/2020, 11:44 AM

## 2020-12-31 NOTE — Progress Notes (Signed)
Occupational Therapy Progress Note (late entry)   Pt seen in conjunction with PT.  He was assisted with peri care at bed level.  He moved to EOB with total A x 2.  While sitting EOB, worked on facilitation of trunk extension, anterior pelvic tilt, trunk rotation, as well as head and neck control.  He was noted with minimal and brief activation of trunk musculature x 2 and required max - total A for EOB sitting.  He looked to therapist x 1 on command.     12/30/20 1555  OT Visit Information  Last OT Received On 12/31/20  Assistance Needed +2  History of Present Illness 80 y.o. maled presented to ER 12/11/2020 secondary to AMS, word-finding difficulties; admitted for TIA/CVA work up.  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  Per neurology, patient noted with active seizure activity on EEG, correlating with episodes of speech difficulties. Keppra initiated (and planning to increase dosage). 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  Pt with change in status ~9/15 with new onset RT sided weakness, inability to follow commands, and lethargy. CT 9/18 revealed no acute changes.  Precautions  Precautions Fall  Precaution Comments seziure precautions  Required Braces or Orthoses  (Bil heel protectors.)  Pain Assessment  Pain Assessment Faces  Faces Pain Scale 4  Pain Location with scapular retraction/pec stretch/cervical PROM  Pain Descriptors / Indicators Grimacing  Pain Intervention(s) Monitored during session;Repositioned  Cognition  Arousal/Alertness Lethargic  Behavior During Therapy Flat affect  Overall Cognitive Status Impaired/Different from baseline  Area of Impairment  Orientation;Attention;Following commands;Memory;Safety/judgement;Problem solving;Awareness  Orientation Level  (Unable to answer any orientation questions. Slightly turned his head to his name being called x1.)  Current Attention Level Focused  Following Commands Follows one step commands inconsistently;Follows one step commands with increased time (with increased time)  Problem Solving Slow processing;Decreased initiation;Difficulty sequencing;Requires verbal cues;Requires tactile cues  General Comments Pt was able to look to therapist x1 on command.  He maintainted eye opening throughout 81% of the session  Upper Extremity Assessment  RUE Deficits / Details No attempted use or AROM noted  Lower Extremity Assessment  Lower Extremity Assessment Defer to PT evaluation  ADL  Overall ADL's  Needs assistance/impaired  Functional mobility during ADLs Total assistance;+2 for physical assistance  General ADL Comments Pt requires total A for all aspects of self care.  No attmepts to assist this date.  He was incontinent of stool and was assisted with peri care in supine  Bed Mobility  Overal bed mobility Needs Assistance  Bed Mobility Sidelying to Sit;Rolling;Sit to Sidelying  Rolling Total assist;+2 for physical assistance;+2 for safety/equipment  Sidelying to sit Total assist;+2 for physical assistance;+2 for safety/equipment  Sit to supine Total assist;+2 for physical assistance;+2 for safety/equipment  General bed mobility comments Pt soiled when PT/OT arrived and rolled several times for pericare/linen change. +2 total assist for all bed mobility and repositioning in the bed.  Balance  Overall balance assessment Needs assistance  Sitting-balance support Single extremity supported;Feet supported  Sitting balance-Leahy Scale Zero  Sitting balance - Comments Max-Total Assist from physical therapist to sit EOB throughout session.  Postural control Posterior lean;Left lateral lean  Standing  balance comment Not able to assess  Vision- Assessment  Additional Comments Rt gaze  preference when seated. He did look to therapist x 1  Transfers  General transfer comment unable to attempt  Other Exercises  Other Exercises While seated EOB, worked on facilitation of anterior pelvic tilt with thoracic extension, trunk rotation, and head/neck control.  He was noted with very minimal trunk activation x 2 in response to facillitation  OT - End of Session  Activity Tolerance Patient limited by lethargy  Patient left in bed;with call bell/phone within reach  Nurse Communication Mobility status  OT Assessment/Plan  OT Plan Discharge plan needs to be updated  OT Visit Diagnosis Other abnormalities of gait and mobility (R26.89);Other symptoms and signs involving cognitive function;Feeding difficulties (R63.3);Hemiplegia and hemiparesis  Hemiplegia - Right/Left Right  Hemiplegia - dominant/non-dominant Dominant  Hemiplegia - caused by Unspecified  OT Frequency (ACUTE ONLY) Min 2X/week  Follow Up Recommendations SNF;Supervision/Assistance - 24 hour  OT Equipment None recommended by OT  AM-PAC OT "6 Clicks" Daily Activity Outcome Measure (Version 2)  Help from another person eating meals? 1  Help from another person taking care of personal grooming? 1  Help from another person toileting, which includes using toliet, bedpan, or urinal? 1  Help from another person bathing (including washing, rinsing, drying)? 1  Help from another person to put on and taking off regular upper body clothing? 1  Help from another person to put on and taking off regular lower body clothing? 1  6 Click Score 6  Progressive Mobility  What is the highest level of mobility based on the progressive mobility assessment? Level 1 (Bedfast) - Unable to balance while sitting on edge of bed  Mobility Sit up in bed/chair position for meals  OT Goal Progression  Progress towards OT goals Not progressing toward goals -  comment (level of arousal)  Acute Rehab OT Goals  Patient Stated Goal None stated - no family present.  OT Time Calculation  OT Start Time (ACUTE ONLY) 1320  OT Stop Time (ACUTE ONLY) 1358  OT Time Calculation (min) 38 min  OT General Charges  $OT Visit 1 Visit  OT Treatments  $Neuromuscular Re-education 8-22 mins  Nilsa Nutting., OTR/L Acute Rehabilitation Services Pager [phone] Office [phone]

## 2020-12-31 NOTE — Progress Notes (Signed)
[facility] Infect[facility] Disease  Date of Admission:  12/17/2020     Lines:  Peripheral iv's   Abx: 9/21-c vanc 9/22-c cefepime  9/21-22 piptazo                                                             9/16 vanc 9/14-16 amp-sulb 9/13-14 azith/ceftriaxone   Assessment: 80 y.o. 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 I have called micro lab 9/22 to workup susceptibility profile for 9/13 staph epi. Will wait to see what 9/21 blood cx show to see if TEE would be warranted. So far tte no obvious evidence of pacemaker infection/endocarditis   In the mean time, other consideration for fever is aspiration pneumonitis. Cxr has been negative but at times remain fairly insensitive. However, reasonable to transition to oral abx via tube feed and give short course 3-5 days   His melanoma (presumed in remission)/apml unlikely to play a role. He has no obvious sign of drug fever or other infectious syndrome at this time. Lft's have been normal. Ucx 9/14 <10k insign growth; previous csf cx 9/16 negative. Ucx 9/21 repeat is in progress   ----------------- 9/23 assessment 9/21 bcx growing staph epi. Discussed with micro to also setup for susceptibility testing 9/13 bcx staph epi is being setup for the  same Slight improved mentation today for me but still very poor it appears (eyes open to voice but not able to have any meaningful interaction) Slight swelling forearm left side, imaging trace joint effusion. Primary team planning to tap  Overall appearring at this point the [place] is real I don't see obvious evidence pneumonitis, so if tomorrow 9/24 bcx remains negative could stop cefepime to reduce risk inducing encephalopathy/seizure   Plan: Continue cefepime. If tomorrow 9/23 bcx remains negative for gram negative and no issue respiratory standpoint, can stop Continue vancomycin Await 9/13 and 9/21 bcx susceptibility testing to compare if same organism, prior to deciding on tee Discussed with primary team   I spent more than 35 minute reviewing data/chart, and coordinating care and >50% direct face to face time providing counseling/discussing diagnostics/treatment plan with patient   Principal Problem:   Focal seizures ([place]) Active Problems:   ICH (intracerebral hemorrhage) ([place]) - L frontal due to HTN vs CAA   Benign essential HTN   OSA on CPAP   Hemiplegia and hemiparesis following nontraumatic intracerebral hemorrhage affecting right dominant side ([place])   Chronic kidney disease, stage III (moderate) ([place])   Metastatic melanoma to lung ([place])   Acquired hypothyroidism   Fever   Presence of cardiac pacemaker   Hyperlipidemia  Malnutrition of moderate degree   Bacteremia   Allergies  Allergen Reactions   Ezetimibe Other (See Comments)    Unknown reaction   Simvastatin Other (See Comments)    Reaction:  Gave pt a fever  Fever - temp of 103.   In 2003    Scheduled Meds:  allopurinol  200 mg Per Tube Daily   aspirin  81 mg Per Tube Daily   Chlorhexidine Gluconate Cloth  6 each Topical Daily   enoxaparin (LOVENOX) injection  30 mg Subcutaneous Q24H   feeding supplement (PROSource TF)  45 mL Per Tube BID   free water  300 mL Per Tube Q6H   insulin aspart  0-15  Units Subcutaneous Q4H   insulin aspart  0-5 Units Subcutaneous QHS   insulin detemir  10 Units Subcutaneous BID   levothyroxine  137 mcg Per Tube Q0600   lidocaine (PF)  5 mL Other Once   metoprolol tartrate  37.5 mg Per Tube BID   tamsulosin  0.4 mg Oral Daily   valproic acid  500 mg Per Tube BID   And   valproic acid  250 mg Per Tube Q24H   Continuous Infusions:  sodium chloride Stopped (12/28/20 0843)   ceFEPime (MAXIPIME) IV 2 g (12/31/20 0025)   feeding supplement (OSMOLITE 1.5 CAL) 1,000 mL (12/31/20 0640)   lacosamide (VIMPAT) IV 150 mg (12/31/20 1032)   vancomycin 1,000 mg (12/30/20 1406)   PRN Meds:.acetaminophen **OR** acetaminophen, albuterol, colchicine, midazolam, ondansetron **OR** ondansetron (ZOFRAN) IV   SUBJECTIVE: Fever curve improving Patient remains very altered Unable to do review of system with him directly Per nursing staff no issue over night  Review of Systems: ROS All other ROS was negative, except mentioned above     OBJECTIVE: Vitals:   12/31/20 0417 12/31/20 0800 12/31/20 1022 12/31/20 1102  BP: 122/74 130/79 129/76 140/79  Pulse: 91 95 96 87  Resp: 18 (!) 26  (!) 22  Temp: 98.3 F (36.8 C) 98.5 F (36.9 C)  98.7 F (37.1 C)  TempSrc: Oral Axillary  Axillary  SpO2: 94% 93%  94%  Weight:       Body mass index is 30.74 kg/m.  Physical Exam General/constitutional: altered, in restraint bed, eyes open to voice; no verbal; no interaction  HEENT: Normocephalic, craniotomy scar Neck supple CV: rrr no mrg Lungs: clear to auscultation, normal respiratory effort Abd: Soft, Nontender Ext: slight edema left arm Skin: No Rash Neuro: nonfocal MSK: able to do passive rom elbow with minimal if any tenderness    Lab Results Lab Results  Component Value Date   WBC 8.6 12/31/2020   HGB 13.8 12/31/2020   HCT 41.1 12/31/2020   MCV 95.4 12/31/2020   PLT 178 12/31/2020    Lab Results  Component Value Date   CREATININE 2.18 (H)  12/31/2020   BUN 55 (H) 12/31/2020   NA 136 12/31/2020   K 4.7 12/31/2020   CL 103 12/31/2020   CO2 25 12/31/2020    Lab Results  Component Value Date   ALT 16 12/31/2020   AST 23 12/31/2020   ALKPHOS 42 12/31/2020   BILITOT 0.9 12/31/2020      Microbiology: Recent Results (from the past 240 hour(s))  Resp Panel by RT-PCR (Flu A&B, Covid) Nasopharyngeal Swab     Status: None   Collection Time: 12/21/20  4:34 PM   Specimen: Nasopharyngeal Swab; Nasopharyngeal(NP) swabs in vial transport medium  Result Value Ref Range Status   SARS Coronavirus  2 by RT PCR NEGATIVE NEGATIVE Final    Comment: (NOTE) SARS-CoV-2 target nucleic acids are NOT DETECTED.  The SARS-CoV-2 RNA is generally detectable in upper respiratory specimens during the acute phase of infection. The lowest concentration of SARS-CoV-2 viral copies this assay can detect is 138 copies/mL. A negative result does not preclude SARS-Cov-2 infection and should not be used as the sole basis for treatment or other patient management decisions. A negative result may occur with  improper specimen collection/handling, submission of specimen other than nasopharyngeal swab, presence of viral mutation(s) within the areas targeted by this assay, and inadequate number of viral copies(<138 copies/mL). A negative result must be combined with clinical observations, patient history, and epidemiological information. The expected result is Negative.  Fact Sheet for Patients:  EntrepreneurPulse.com.au  Fact Sheet for Healthcare Providers:  IncredibleEmployment.be  This test is no t yet approved or cleared by the [place] FDA and  has been authorized for detection and/or diagnosis of SARS-CoV-2 by FDA under an Emergency Use Authorization (EUA). This EUA will remain  in effect (meaning this test can be used) for the duration of the COVID-19 declaration under Section 564(b)(1) of the Act,  21 U.S.C.section 360bbb-3(b)(1), unless the authorization is terminated  or revoked sooner.       Influenza A by PCR NEGATIVE NEGATIVE Final   Influenza B by PCR NEGATIVE NEGATIVE Final    Comment: (NOTE) The Xpert Xpress SARS-CoV-2/FLU/RSV plus assay is intended as an aid in the diagnosis of influenza from Nasopharyngeal swab specimens and should not be used as a sole basis for treatment. Nasal washings and aspirates are unacceptable for Xpert Xpress SARS-CoV-2/FLU/RSV testing.  Fact Sheet for Patients: EntrepreneurPulse.com.au  Fact Sheet for Healthcare Providers: IncredibleEmployment.be  This test is not yet approved or cleared by the [place] FDA and has been authorized for detection and/or diagnosis of SARS-CoV-2 by FDA under an Emergency Use Authorization (EUA). This EUA will remain in effect (meaning this test can be used) for the duration of the COVID-19 declaration under Section 564(b)(1) of the Act, 21 U.S.C. section 360bbb-3(b)(1), unless the authorization is terminated or revoked.  Performed at [facility], [place] [address], [place], [place] [address]   MRSA Next Gen by PCR, Nasal     Status: Abnormal   Collection Time: 12/21/20  4:36 PM   Specimen: Nasopharyngeal Swab; Nasal Swab  Result Value Ref Range Status   MRSA by PCR Next Gen NEGATIVE (A) NOT DETECTED Final    Comment:        The GeneXpert MRSA Assay (FDA approved for NASAL specimens only), is one component of a comprehensive MRSA colonization surveillance program. It is not intended to diagnose MRSA infection nor to guide or monitor treatment for MRSA infections. Performed at [facility], [place] [address], [place], [place] [address]   Culture, blood (routine x 2)     Status: Abnormal (Preliminary result)   Collection Time: 12/21/20  5:13 PM   Specimen: BLOOD RIGHT HAND  Result Value Ref Range Status   Specimen Description BLOOD RIGHT HAND   Final   Special Requests   Final    BOTTLES DRAWN AEROBIC ONLY Blood Culture results may not be optimal due to an inadequate volume of blood received in culture bottles   Culture  Setup Time   Final    GRAM POSITIVE COCCI IN CLUSTERS AEROBIC BOTTLE ONLY CRITICAL RESULT CALLED TO, READ BACK BY AND VERIFIED WITH: [place] L [id number]  FCP    Culture (A)  Final    STAPHYLOCOCCUS EPIDERMIDIS SUSCEPTIBILITIES TO FOLLOW Performed at [facility], [place] [address] [place], [place] [address]    Report Status PENDING  Incomplete  Blood Culture ID Panel (Reflexed)     Status: Abnormal   Collection Time: 12/21/20  5:13 PM  Result Value Ref Range Status   Enterococcus faecalis NOT DETECTED NOT DETECTED Final   Enterococcus Faecium NOT DETECTED NOT DETECTED Final   Listeria monocytogenes NOT DETECTED NOT DETECTED Final   Staphylococcus species DETECTED (A) NOT DETECTED Final    Comment: CRITICAL RESULT CALLED TO, READ BACK BY AND VERIFIED WITH: PHARMD ALEX L [id number] FCP    Staphylococcus aureus (BCID) NOT DETECTED NOT DETECTED Final   Staphylococcus epidermidis DETECTED (A) NOT DETECTED Final    Comment: Methicillin (oxacillin) resistant coagulase negative staphylococcus. Possible blood culture contaminant (unless isolated from more than one blood culture draw or clinical case suggests pathogenicity). No antibiotic treatment is indicated for blood  culture contaminants. CRITICAL RESULT CALLED TO, READ BACK BY AND VERIFIED WITH: PHARMD ALEX L [id number] FCP    Staphylococcus lugdunensis NOT DETECTED NOT DETECTED Final   Streptococcus species NOT DETECTED NOT DETECTED Final   Streptococcus agalactiae NOT DETECTED NOT DETECTED Final   Streptococcus pneumoniae NOT DETECTED NOT DETECTED Final   Streptococcus pyogenes NOT DETECTED NOT DETECTED Final   A.calcoaceticus-baumannii NOT DETECTED NOT DETECTED Final   Bacteroides fragilis NOT DETECTED NOT DETECTED Final   Enterobacterales  NOT DETECTED NOT DETECTED Final   Enterobacter cloacae complex NOT DETECTED NOT DETECTED Final   Escherichia coli NOT DETECTED NOT DETECTED Final   Klebsiella aerogenes NOT DETECTED NOT DETECTED Final   Klebsiella oxytoca NOT DETECTED NOT DETECTED Final   Klebsiella pneumoniae NOT DETECTED NOT DETECTED Final   Proteus species NOT DETECTED NOT DETECTED Final   Salmonella species NOT DETECTED NOT DETECTED Final   Serratia marcescens NOT DETECTED NOT DETECTED Final   Haemophilus influenzae NOT DETECTED NOT DETECTED Final   Neisseria meningitidis NOT DETECTED NOT DETECTED Final   Pseudomonas aeruginosa NOT DETECTED NOT DETECTED Final   Stenotrophomonas maltophilia NOT DETECTED NOT DETECTED Final   Candida albicans NOT DETECTED NOT DETECTED Final   Candida auris NOT DETECTED NOT DETECTED Final   Candida glabrata NOT DETECTED NOT DETECTED Final   Candida krusei NOT DETECTED NOT DETECTED Final   Candida parapsilosis NOT DETECTED NOT DETECTED Final   Candida tropicalis NOT DETECTED NOT DETECTED Final   Cryptococcus neoformans/gattii NOT DETECTED NOT DETECTED Final   Methicillin resistance mecA/C DETECTED (A) NOT DETECTED Final    Comment: CRITICAL RESULT CALLED TO, READ BACK BY AND VERIFIED WITH: PHARMD ALEX L [id number] FCP Performed at [facility], [address] [place], [place] [address]   Culture, blood (routine x 2)     Status: None   Collection Time: 12/21/20  5:45 PM   Specimen: BLOOD RIGHT ARM  Result Value Ref Range Status   Specimen Description BLOOD RIGHT ARM  Final   Special Requests   Final    BOTTLES DRAWN AEROBIC ONLY Blood Culture adequate volume   Culture   Final    NO GROWTH 5 DAYS Performed at [facility], [place] [address] [place], [place] [address]    Report Status 12/26/2020 FINAL  Final  Urine Culture     Status: Abnormal   Collection Time: 12/22/20  3:40 AM   Specimen: Urine, Clean Catch  Result  Value Ref Range Status   Specimen Description  URINE, CLEAN CATCH  Final   Special Requests NONE  Final   Culture (A)  Final    <10,000 COLONIES/mL INSIGNIFICANT GROWTH Performed at [facility], [address], [place], [place] [address]    Report Status 12/23/2020 FINAL  Final  Culture, blood (routine x 2)     Status: None   Collection Time: 12/24/20 11:39 AM   Specimen: BLOOD LEFT HAND  Result Value Ref Range Status   Specimen Description BLOOD LEFT HAND  Final   Special Requests BOTTLES DRAWN AEROBIC ONLY  Final   Culture   Final    NO GROWTH 5 DAYS Performed at [facility], [place] [address], [place], [place] [address]    Report Status 12/29/2020 FINAL  Final  Culture, blood (routine x 2)     Status: None   Collection Time: 12/24/20 11:49 AM   Specimen: BLOOD RIGHT HAND  Result Value Ref Range Status   Specimen Description BLOOD RIGHT HAND  Final   Special Requests BOTTLES DRAWN AEROBIC ONLY  Final   Culture   Final    NO GROWTH 5 DAYS Performed at [facility], [place] [address], [place], [place] [address]    Report Status 12/29/2020 FINAL  Final  CSF culture w Gram Stain     Status: None   Collection Time: 12/24/20  6:19 PM   Specimen: CSF; Cerebrospinal Fluid  Result Value Ref Range Status   Specimen Description CSF  Final   Special Requests Normal  Final   Gram Stain NO ORGANISMS SEEN NO WBC SEEN CYTOSPIN SMEAR   Final   Culture   Final    NO GROWTH 3 DAYS Performed at [facility], [place] [address] [place], [place] [address]    Report Status 12/28/2020 FINAL  Final  Culture, blood (routine x 2)     Status: Abnormal (Preliminary result)   Collection Time: 12/29/20  7:23 AM   Specimen: BLOOD  Result Value Ref Range Status   Specimen Description BLOOD LEFT ANTECUBITAL  Final   Special Requests   Final    BOTTLES DRAWN AEROBIC AND ANAEROBIC Blood Culture adequate volume   Culture  Setup Time   Final    GRAM POSITIVE COCCI IN BOTH AEROBIC AND ANAEROBIC BOTTLES CRITICAL VALUE NOTED.   VALUE IS CONSISTENT WITH PREVIOUSLY REPORTED AND CALLED VALUE.    Culture (A)  Final    STAPHYLOCOCCUS EPIDERMIDIS THE SIGNIFICANCE OF ISOLATING THIS ORGANISM FROM A SINGLE SET OF BLOOD CULTURES WHEN MULTIPLE SETS ARE DRAWN IS UNCERTAIN. PLEASE NOTIFY THE MICROBIOLOGY DEPARTMENT WITHIN ONE WEEK IF SPECIATION AND SENSITIVITIES ARE REQUIRED. Performed at [facility], [place] [address], [place], [place] [address]    Report Status PENDING  Incomplete  Culture, blood (routine x 2)     Status: None (Preliminary result)   Collection Time: 12/29/20  7:24 AM   Specimen: BLOOD LEFT HAND  Result Value Ref Range Status   Specimen Description BLOOD LEFT HAND  Final   Special Requests   Final    BOTTLES DRAWN AEROBIC AND ANAEROBIC Blood Culture adequate volume   Culture   Final    NO GROWTH 1 DAY Performed at [facility], [place] [address], [place], [place] [address]    Report Status PENDING  Incomplete  Resp Panel by RT-PCR (Flu A&B, Covid) Nasopharyngeal Swab     Status: None   Collection Time:  12/29/20  8:49 AM   Specimen: Nasopharyngeal Swab; Nasopharyngeal(NP) swabs in vial transport medium  Result Value Ref Range Status   SARS Coronavirus 2 by RT PCR NEGATIVE NEGATIVE Final    Comment: (NOTE) SARS-CoV-2 target nucleic acids are NOT DETECTED.  The SARS-CoV-2 RNA is generally detectable in upper respiratory specimens during the acute phase of infection. The lowest concentration of SARS-CoV-2 viral copies this assay can detect is 138 copies/mL. A negative result does not preclude SARS-Cov-2 infection and should not be used as the sole basis for treatment or other patient management decisions. A negative result may occur with  improper specimen collection/handling, submission of specimen other than nasopharyngeal swab, presence of viral mutation(s) within the areas targeted by this assay, and inadequate number of viral copies(<138 copies/mL). A negative result must be combined  with clinical observations, patient history, and epidemiological information. The expected result is Negative.  Fact Sheet for Patients:  EntrepreneurPulse.com.au  Fact Sheet for Healthcare Providers:  IncredibleEmployment.be  This test is no t yet approved or cleared by the [place] FDA and  has been authorized for detection and/or diagnosis of SARS-CoV-2 by FDA under an Emergency Use Authorization (EUA). This EUA will remain  in effect (meaning this test can be used) for the duration of the COVID-19 declaration under Section 564(b)(1) of the Act, 21 U.S.C.section 360bbb-3(b)(1), unless the authorization is terminated  or revoked sooner.       Influenza A by PCR NEGATIVE NEGATIVE Final   Influenza B by PCR NEGATIVE NEGATIVE Final    Comment: (NOTE) The Xpert Xpress SARS-CoV-2/FLU/RSV plus assay is intended as an aid in the diagnosis of influenza from Nasopharyngeal swab specimens and should not be used as a sole basis for treatment. Nasal washings and aspirates are unacceptable for Xpert Xpress SARS-CoV-2/FLU/RSV testing.  Fact Sheet for Patients: EntrepreneurPulse.com.au  Fact Sheet for Healthcare Providers: IncredibleEmployment.be  This test is not yet approved or cleared by the [place] FDA and has been authorized for detection and/or diagnosis of SARS-CoV-2 by FDA under an Emergency Use Authorization (EUA). This EUA will remain in effect (meaning this test can be used) for the duration of the COVID-19 declaration under Section 564(b)(1) of the Act, 21 U.S.C. section 360bbb-3(b)(1), unless the authorization is terminated or revoked.  Performed at [facility], [place] [address], [place], [place] [address]   Urine Culture     Status: None   Collection Time: 12/29/20 10:48 AM   Specimen: Urine, Catheterized  Result Value Ref Range Status   Specimen Description URINE, CATHETERIZED   Final   Special Requests NONE  Final   Culture   Final    NO GROWTH Performed at [facility], [address], [place], [place] [address]    Report Status 12/30/2020 FINAL  Final     Serology:   Imaging: If present, new imagings (plain films, ct scans, and mri) have been personally visualized and interpreted; radiology reports have been reviewed. Decision making incorporated into the Impression / Recommendations.  9/22 ct left elbow 1. No acute osseous abnormality. 2. Small joint effusion. 3. Diffuse soft tissue swelling.  9/21 cxr FINDINGS: A feeding tube is seen extending into the abdomen, however, the tip of the feeding tube extends below the lower margin of the image. Lung volumes are low elevated right hemidiaphragm, similar to the prior study. No acute consolidative airspace disease. No pleural effusions. No pneumothorax. No evidence of pulmonary edema. Heart size is normal. Upper mediastinal contours  are within normal limits allowing for patient positioning. Atherosclerotic calcifications in the thoracic aorta. Left-sided biventricular pacemaker, with lead tips projecting over the expected location of the right atrium, right ventricle and overlying the left ventricle via the coronary sinus and coronary veins.   IMPRESSION: 1. Support apparatus, as above. 2. Elevated right hemidiaphragm, similar to prior studies. 3. Low lung volumes without radiographic evidence of acute cardiopulmonary disease. 4. Aortic atherosclerosis.  Jabier Mutton, [facility] [place] [phone] pager    12/31/2020, 1:29 PM

## 2020-12-31 NOTE — Progress Notes (Signed)
  Echocardiogram 2D Echocardiogram has been performed.  David [name] 12/31/2020, 4:20 PM

## 2020-12-31 NOTE — Progress Notes (Addendum)
PROGRESS NOTE        PATIENT DETAILS Name: David [name]. Age: 80 y.o. Sex: male Date of Birth: 04-04-1941 Admit Date: 12/25/2020 Admitting Physician No admitting provider for patient encounter. [id number]:[email or web address], David Hatchet, MD  Brief Narrative: Patient is a 80 y.o. male with history of metastatic melanoma with bone/lung/brain mets-s/p brain radiation/left frontal craniotomy and tumor excision (July 2017), acute promyelocytic leukemia, hemorrhagic CVA, HTN, CAD s/p CABG 2014, AV block-s/p PPM placement, OSA on CPAP, depression who presented to [facility] with aphasia-found to have EEG evidence of seizures originating from the left temporal region-subsequently transferred to [facility] for LTM EEG.  Patient was felt to have intractable seizures-and was started on multiple antiepileptics (Onfi, Depakote, Keppra, Vimpat) subsequent hospital course complicated by encephalopathy (felt to be due to Onfi/seizure meds/AKI ) and Fever.  Subjective: A bit more awake and alert compared to yesterday-open eyes when I knocked on the door.  Said good morning-still very groggy.  Tracking my movement at times.  Although improved-his interaction with me is not consistent-he will at times interact and at times he will not.  Objective: Vitals: Blood pressure 129/76, pulse 96, temperature 98.5 F (36.9 C), temperature source Axillary, resp. rate (!) 26, weight 91.7 kg, SpO2 93 %.   Exam: Gen Exam: Still lethargic but slightly better than the past few days.  Not in any distress.  NG tube in place. HEENT:atraumatic, normocephalic Chest: B/L clear to auscultation anteriorly CVS:S1S2 regular Abdomen:soft non tender, non distended Extremities: Mild left upper extremity swelling-grimaces when left elbow flexed. Neurology: Hard to evaluate given mental status. Skin: no rash   Pertinent labs/radiology: Na: 136 Creatinine: 2.18 WBC: 8.6 Plt: 178  Uric acid: 4.4  Lumbar puncture: 9/16>>CSF:  WBC 3, protein 27 9/16>> CSF culture: No growth 9/16>>CSF HSV/Crypto Ag:neg  Stroke work-up: 9/16>> CT head: Right cerebellar CVA 9/16>> CTA head and neck: No LVO 9/16>> Echo: EF 55-60% 9/16>> LDL: 42 9/16>> A1c: 5.8 9/18: CT head: No acute abnormality-lesion seen on CT head on 9/16 in right cerebellar peduncle was likely artifactual.  Radiology: 9/21>> CT abdomen/pelvis: No acute abnormalities-mild infiltration in lung bases right> left 9/22>> CT left elbow: Small joint effusion. 9/22>> B/L lower extremity Doppler: No DVT 9/22>> left upper extremity Doppler: Chronic superficial vein thrombosis involving the left cephalic vein.  Microbiology: 9/14>>Blood culture: 1/2-staph epidermidis 9/16>> blood culture: No growth 9/21>> blood culture: 1/2 staph epidermidis. 9/21>> urine culture: No growth 9/21>> COVID PCR: Negative 9/21: Urine culture: No growth 9/22>> blood culture: Pending  Pathology: 9/16>>CSF cytology: No malignant cells identified.  Assessment/Plan: New refractory focal seizures: No further seizures on LTM EEG (last EEG seizure on 9/15).remains on Vimpat and Depakote.  Onfi/Keppra discontinued given severe encephalopathy.  LTM EEG discontinued on 9/21.  Neurology following.    Acute toxic metabolic encephalopathy: Thought to be due to seizure medications (particularly Onfi which is a long half-life) in the setting of AKI/hyponatremia (significantly improved).  Gradually improving over the past few days-a bit more improved compared to the day before-but still lethargic-interacts at times.  Awaiting Onfi levels-neurology following.     ?Acute right cerebellar CVA: CT head on 9/16 showed possible right cerebellar stroke-however repeat CT head on 9/18 did not show stroke-it is now felt that lesion on the right cerebellar lobe on 9/16 was a artifact.  CVA ruled out.  Back on aspirin-no longer  on plavix.  PPM interrogation completed-no A. fib detected.   Recurrent fever  (occurred on 9/13-likely aspiration pneumonia-reoccurred on 9/20): Afebrile overnight-may have some mild aspiration pneumonitis but has 2 positive cultures for staph epidermidis-has PPM in place-concern for pacemaker lead infection.  Also has some mild swelling/tenderness in his left elbow-has a history of gout-have asked radiology to see if arthrocentesis can be performed so we can send for cell count/crystals and cultures.  Empirically on vancomycin and cefepime-ID following.    Coag negative staph bacteremia (1/2 blood culture on 9/14, and 1/2 on 9/21): Has PPM in place-concern that this may be a real infection-and pacemaker leads could have gotten seeded.  Awaiting limited echo-on IV vancomycin-ID following.  Given his ongoing encephalopathy/frailty-he will not be able to tolerate a TEE unless he significantly improves.  AKI on CKD stage IIIb: AKI due to contrast nephropathy-improved-and back to baseline.  Avoid nephrotoxic agents.Per daughter-Dr Leggett-patient has thin basement membrane disease and has chronic microscopic hematuria  Acute urinary retention-on 9/18-failed a voiding trial-Foley catheter reinserted on 9/22: Continue Flomax-we will need to attempt a voiding trial again when he is a bit more awake alert and less debilitated.  Hypernatremia: Secondary to dehydration (no oral intake due to encephalopathy)-continue free water flushes via PEG tube.  Sodium levels have normalized.  Hyperglycemia: Likely due to tube feedings-no evidence of diabetes.  CBGs stabilized-continue Levemir 10 units twice daily and SSI.    Recent Labs    12/30/20 2354 12/31/20 0540 12/31/20 0811  GLUCAP 194* 208* 172*    HTN: BP relatively stable-on metoprolol-if blood pressure continues to be on the higher side-May need to resume amlodipine.  Reassess 9/21.  History of CAD s/p CABG in 2014: Remains stable-continue aspirin/beta-blocker.    History of complete heart block-PPM in place  History of  hemiplegia and hemiparesis following nontraumatic intracranial hemorrhage affecting right dominant side  History of metastatic melanoma with metastasis to the lingula, right upper lung, supraventricular clavicular lymph node, left frontal lobe metastasis-s/p resection: Resume outpatient follow-up with oncology/neurooncology.    History of acute promyelocytic leukemia: Follow CBC periodically--in remission-stable for outpatient follow-up with primary oncologist.  Major depressive disorder: Trazodone/Zoloft held due to encephalopathy-resume over the next few days once mentation improves further.  Hypothyroidism: Continue levothyroxine.  Recent TSH in May was within normal limits.   OSA:on CPAP-on hold as NG tube is in place.  Nutrition: Cortak tube placed on 9/16-feedings ongoing.  Will need SLP reevaluation once encephalopathy has improved before discontinuing cortak.  Functional quadriplegia/severe debility/deconditioning: Normally able to ambulate with the help of walker-current significant debility is from acute illness.  Once encephalopathy resolves-hopeful that we can start PT/OT.  Palliative care: DNR in place.  I have had long discussions with patient's daughter over the past few days-although hopeful that he will improve-she does however want to touch base with palliative care-in case it was felt that he would not have any significant meaningful recovery to have some quality to his life.  Have touched base with Dr. Hilma Favors who will discuss with family over the next few days.  Obesity: Estimated body mass index is 30.74 kg/m as calculated from the following:   Height as of 12/15/20: 5\' 8"  (1.727 m).   Weight as of this encounter: 91.7 kg.    Procedures : 9/16>> lumbar puncture by IR Consults:Neurology DVT Prophylaxis :Prophylactic Lovenox Code Status:DNR Family Communication:Daughter-[name] [phone] on 9/23  Disposition Plan: Status is: Inpatient  Remains  inpatient appropriate because:Inpatient level of  care appropriate due to severity of illness  Dispo: The patient is from: Home              Anticipated d/c is to: SNF              Patient currently is not medically stable to d/c.   Difficult to place patient No    Barriers to Discharge: Ongoing encephalopathy-not yet at baseline-not yet stable for discharge.  Antimicrobial agents: Anti-infectives (From admission, onward)    Start     Dose/Rate Route Frequency Ordered Stop   12/30/20 1730  ceFEPIme (MAXIPIME) 2 g in sodium chloride 0.9 % 100 mL IVPB        2 g 200 mL/hr over 30 Minutes Intravenous Every 24 hours 12/30/20 1632     12/30/20 1400  vancomycin (VANCOCIN) IVPB 1000 mg/200 mL premix        1,000 mg 200 mL/hr over 60 Minutes Intravenous Every 48 hours 12/30/20 1312     12/29/20 2100  vancomycin (VANCOREADY) IVPB 1750 mg/350 mL        1,750 mg 175 mL/hr over 120 Minutes Intravenous  Once 12/29/20 2054 12/30/20 0017   12/29/20 1630  piperacillin-tazobactam (ZOSYN) IVPB 3.375 g  Status:  Discontinued        3.375 g 12.5 mL/hr over 240 Minutes Intravenous Every 8 hours 12/29/20 1524 12/30/20 1632   12/29/20 1630  vancomycin (VANCOREADY) IVPB 1750 mg/350 mL  Status:  Discontinued        1,750 mg 175 mL/hr over 120 Minutes Intravenous  Once 12/29/20 1524 12/29/20 2054   12/26/20 1600  vancomycin (VANCOREADY) IVPB 1250 mg/250 mL  Status:  Discontinued        1,250 mg 166.7 mL/hr over 90 Minutes Intravenous Every 48 hours 12/24/20 1252 12/25/20 1038   12/26/20 1600  vancomycin (VANCOCIN) IVPB 1000 mg/200 mL premix  Status:  Discontinued        1,000 mg 200 mL/hr over 60 Minutes Intravenous Every 48 hours 12/25/20 1038 12/25/20 1547   12/24/20 1800  Ampicillin-Sulbactam (UNASYN) 3 g in sodium chloride 0.9 % 100 mL IVPB        3 g 200 mL/hr over 30 Minutes Intravenous Every 12 hours 12/24/20 1309 12/25/20 1754   12/24/20 1345  vancomycin (VANCOREADY) IVPB 1750 mg/350 mL         1,750 mg 175 mL/hr over 120 Minutes Intravenous  Once 12/24/20 1251 12/24/20 1548   12/22/20 1300  ampicillin-sulbactam (UNASYN) 1.5 g in sodium chloride 0.9 % 100 mL IVPB  Status:  Discontinued        1.5 g 200 mL/hr over 30 Minutes Intravenous Every 8 hours 12/22/20 1202 12/24/20 1309   12/21/20 1900  azithromycin (ZITHROMAX) 500 mg in sodium chloride 0.9 % 250 mL IVPB  Status:  Discontinued        500 mg 250 mL/hr over 60 Minutes Intravenous Every 24 hours 12/21/20 1805 12/22/20 1058   12/21/20 1730  cefTRIAXone (ROCEPHIN) 1 g in sodium chloride 0.9 % 100 mL IVPB  Status:  Discontinued        1 g 200 mL/hr over 30 Minutes Intravenous Every 24 hours 12/21/20 1633 12/22/20 1058   12/21/20 1730  azithromycin (ZITHROMAX) tablet 500 mg  Status:  Discontinued        500 mg Oral Daily 12/21/20 1633 12/21/20 1804        Time spent: 35 minutes-Greater than 50% of this time was spent in counseling, explanation of diagnosis,  planning of further management, and coordination of care.  MEDICATIONS: Scheduled Meds:  allopurinol  200 mg Per Tube Daily   aspirin  81 mg Per Tube Daily   Chlorhexidine Gluconate Cloth  6 each Topical Daily   enoxaparin (LOVENOX) injection  30 mg Subcutaneous Q24H   feeding supplement (PROSource TF)  45 mL Per Tube BID   free water  300 mL Per Tube Q6H   insulin aspart  0-15 Units Subcutaneous Q4H   insulin aspart  0-5 Units Subcutaneous QHS   insulin detemir  10 Units Subcutaneous BID   levothyroxine  137 mcg Per Tube Q0600   lidocaine (PF)  5 mL Other Once   metoprolol tartrate  37.5 mg Per Tube BID   tamsulosin  0.4 mg Oral Daily   valproic acid  500 mg Per Tube BID   And   valproic acid  250 mg Per Tube Q24H   Continuous Infusions:  sodium chloride Stopped (12/28/20 0843)   ceFEPime (MAXIPIME) IV 2 g (12/31/20 0025)   feeding supplement (OSMOLITE 1.5 CAL) 1,000 mL (12/31/20 0640)   lacosamide (VIMPAT) IV 150 mg (12/31/20 1032)   vancomycin 1,000 mg  (12/30/20 1406)   PRN Meds:.acetaminophen **OR** acetaminophen, albuterol, colchicine, midazolam, ondansetron **OR** ondansetron (ZOFRAN) IV   LABORATORY DATA: CBC: Recent Labs  Lab 12/27/20 0551 12/28/20 0249 12/29/20 0331 12/30/20 0305 12/31/20 0351  WBC 7.2 7.4 8.6 8.9 8.6  HGB 14.6 14.8 15.0 13.8 13.8  HCT 44.8 45.4 45.4 42.7 41.1  MCV 98.2 97.2 95.6 96.8 95.4  PLT 136* 147* 164 151 178     Basic Metabolic Panel: Recent Labs  Lab 12/24/20 1650 12/25/20 0605 12/25/20 1631 12/26/20 0551 12/26/20 0740 12/27/20 0551 12/28/20 0249 12/29/20 0331 12/30/20 0305 12/31/20 0351  NA  --  150*  --   --    < > 150* 149* 145 138 136  K  --  4.3  --   --    < > 4.1 3.9 4.5 4.4 4.7  CL  --  118*  --   --    < > 117* 115* 110 104 103  CO2  --  17*  --   --    < > 23 25 26 24 25   GLUCOSE  --  218*  --   --    < > 191* 163* 191* 230* 218*  BUN  --  54*  --   --    < > 59* 50* 50* 53* 55*  CREATININE  --  2.30*  --   --    < > 2.40* 1.87* 1.83* 2.20* 2.18*  CALCIUM  --  8.5*  --   --    < > 8.2* 8.4* 8.3* 7.9* 7.7*  MG 2.2 2.4 2.7* 2.8*  --   --   --   --   --   --   PHOS 3.5 2.6 3.2 3.5  --   --   --   --   --   --    < > = values in this interval not displayed.     GFR: Estimated Creatinine Clearance: 29.2 mL/min (A) (by C-G formula based on SCr of 2.18 mg/dL (H)).  Liver Function Tests: Recent Labs  Lab 12/27/20 0551 12/28/20 0249 12/29/20 0331 12/30/20 0305 12/31/20 0351  AST 20 20 20 19 23   ALT 17 16 14 14 16   ALKPHOS 42 42 40 41 42  BILITOT 0.5 0.4 0.3 0.9 0.9  PROT 5.3*  5.4* 5.6* 5.0* 5.2*  ALBUMIN 2.2* 2.3* 2.2* 1.9* 1.9*    No results for input(s): LIPASE, AMYLASE in the last 168 hours. Recent Labs  Lab 12/25/20 0605 12/26/20 0740 12/29/20 0331 12/30/20 0305 12/31/20 0351  AMMONIA 52* 42* 45* 25 36*     Coagulation Profile: No results for input(s): INR, PROTIME in the last 168 hours.   Cardiac Enzymes: No results for input(s): CKTOTAL,  CKMB, CKMBINDEX, TROPONINI in the last 168 hours.  BNP (last 3 results) No results for input(s): PROBNP in the last 8760 hours.  Lipid Profile: No results for input(s): CHOL, HDL, LDLCALC, TRIG, CHOLHDL, LDLDIRECT in the last 72 hours.   Thyroid Function Tests: No results for input(s): TSH, T4TOTAL, FREET4, T3FREE, THYROIDAB in the last 72 hours.  Anemia Panel: No results for input(s): VITAMINB12, FOLATE, FERRITIN, TIBC, IRON, RETICCTPCT in the last 72 hours.  Urine analysis:    Component Value Date/Time   COLORURINE YELLOW 12/29/2020 1048   APPEARANCEUR CLOUDY (A) 12/29/2020 1048   LABSPEC 1.020 12/29/2020 1048   PHURINE 6.5 12/29/2020 1048   GLUCOSEU NEGATIVE 12/29/2020 1048   GLUCOSEU NEGATIVE 10/12/2020 1118   HGBUR LARGE (A) 12/29/2020 1048   BILIRUBINUR NEGATIVE 12/29/2020 1048   KETONESUR NEGATIVE 12/29/2020 1048   PROTEINUR >300 (A) 12/29/2020 1048   UROBILINOGEN 0.2 10/12/2020 1118   NITRITE NEGATIVE 12/29/2020 1048   LEUKOCYTESUR NEGATIVE 12/29/2020 1048    Sepsis Labs: Lactic Acid, Venous    Component Value Date/Time   LATICACIDVEN 0.8 01/03/2019 0046    MICROBIOLOGY: Recent Results (from the past 240 hour(s))  Resp Panel by RT-PCR (Flu A&B, Covid) Nasopharyngeal Swab     Status: None   Collection Time: 12/21/20  4:34 PM   Specimen: Nasopharyngeal Swab; Nasopharyngeal(NP) swabs in vial transport medium  Result Value Ref Range Status   SARS Coronavirus 2 by RT PCR NEGATIVE NEGATIVE Final    Comment: (NOTE) SARS-CoV-2 target nucleic acids are NOT DETECTED.  The SARS-CoV-2 RNA is generally detectable in upper respiratory specimens during the acute phase of infection. The lowest concentration of SARS-CoV-2 viral copies this assay can detect is 138 copies/mL. A negative result does not preclude SARS-Cov-2 infection and should not be used as the sole basis for treatment or other patient management decisions. A negative result may occur with  improper  specimen collection/handling, submission of specimen other than nasopharyngeal swab, presence of viral mutation(s) within the areas targeted by this assay, and inadequate number of viral copies(<138 copies/mL). A negative result must be combined with clinical observations, patient history, and epidemiological information. The expected result is Negative.  Fact Sheet for Patients:  EntrepreneurPulse.com.au  Fact Sheet for Healthcare Providers:  IncredibleEmployment.be  This test is no t yet approved or cleared by the [place] FDA and  has been authorized for detection and/or diagnosis of SARS-CoV-2 by FDA under an Emergency Use Authorization (EUA). This EUA will remain  in effect (meaning this test can be used) for the duration of the COVID-19 declaration under Section 564(b)(1) of the Act, 21 U.S.C.section 360bbb-3(b)(1), unless the authorization is terminated  or revoked sooner.       Influenza A by PCR NEGATIVE NEGATIVE Final   Influenza B by PCR NEGATIVE NEGATIVE Final    Comment: (NOTE) The Xpert Xpress SARS-CoV-2/FLU/RSV plus assay is intended as an aid in the diagnosis of influenza from Nasopharyngeal swab specimens and should not be used as a sole basis for treatment. Nasal washings and aspirates are unacceptable for Xpert Xpress  SARS-CoV-2/FLU/RSV testing.  Fact Sheet for Patients: EntrepreneurPulse.com.au  Fact Sheet for Healthcare Providers: IncredibleEmployment.be  This test is not yet approved or cleared by the [place] FDA and has been authorized for detection and/or diagnosis of SARS-CoV-2 by FDA under an Emergency Use Authorization (EUA). This EUA will remain in effect (meaning this test can be used) for the duration of the COVID-19 declaration under Section 564(b)(1) of the Act, 21 U.S.C. section 360bbb-3(b)(1), unless the authorization is terminated or revoked.  Performed at  [facility], [place] [address], [place], [place] [address]   MRSA Next Gen by PCR, Nasal     Status: Abnormal   Collection Time: 12/21/20  4:36 PM   Specimen: Nasopharyngeal Swab; Nasal Swab  Result Value Ref Range Status   MRSA by PCR Next Gen NEGATIVE (A) NOT DETECTED Final    Comment:        The GeneXpert MRSA Assay (FDA approved for NASAL specimens only), is one component of a comprehensive MRSA colonization surveillance program. It is not intended to diagnose MRSA infection nor to guide or monitor treatment for MRSA infections. Performed at [facility], [place] [address], [place], [place] [address]   Culture, blood (routine x 2)     Status: Abnormal (Preliminary result)   Collection Time: 12/21/20  5:13 PM   Specimen: BLOOD RIGHT HAND  Result Value Ref Range Status   Specimen Description BLOOD RIGHT HAND  Final   Special Requests   Final    BOTTLES DRAWN AEROBIC ONLY Blood Culture results may not be optimal due to an inadequate volume of blood received in culture bottles   Culture  Setup Time   Final    GRAM POSITIVE COCCI IN CLUSTERS AEROBIC BOTTLE ONLY CRITICAL RESULT CALLED TO, READ BACK BY AND VERIFIED WITH: PHARMD ALEX L [id number] FCP    Culture (A)  Final    STAPHYLOCOCCUS EPIDERMIDIS SUSCEPTIBILITIES TO FOLLOW Performed at [facility], [place] [address], [place], [place] [address]    Report Status PENDING  Incomplete  Blood Culture ID Panel (Reflexed)     Status: Abnormal   Collection Time: 12/21/20  5:13 PM  Result Value Ref Range Status   Enterococcus faecalis NOT DETECTED NOT DETECTED Final   Enterococcus Faecium NOT DETECTED NOT DETECTED Final   Listeria monocytogenes NOT DETECTED NOT DETECTED Final   Staphylococcus species DETECTED (A) NOT DETECTED Final    Comment: CRITICAL RESULT CALLED TO, READ BACK BY AND VERIFIED WITH: PHARMD ALEX L 1305 [id number] FCP    Staphylococcus aureus (BCID) NOT DETECTED NOT DETECTED Final   Staphylococcus  epidermidis DETECTED (A) NOT DETECTED Final    Comment: Methicillin (oxacillin) resistant coagulase negative staphylococcus. Possible blood culture contaminant (unless isolated from more than one blood culture draw or clinical case suggests pathogenicity). No antibiotic treatment is indicated for blood  culture contaminants. CRITICAL RESULT CALLED TO, READ BACK BY AND VERIFIED WITH: PHARMD ALEX L [id number] FCP    Staphylococcus lugdunensis NOT DETECTED NOT DETECTED Final   Streptococcus species NOT DETECTED NOT DETECTED Final   Streptococcus agalactiae NOT DETECTED NOT DETECTED Final   Streptococcus pneumoniae NOT DETECTED NOT DETECTED Final   Streptococcus pyogenes NOT DETECTED NOT DETECTED Final   A.calcoaceticus-baumannii NOT DETECTED NOT DETECTED Final   Bacteroides fragilis NOT DETECTED NOT DETECTED Final   Enterobacterales NOT DETECTED NOT DETECTED Final   Enterobacter cloacae complex NOT DETECTED NOT DETECTED Final   Escherichia coli NOT DETECTED NOT DETECTED  Final   Klebsiella aerogenes NOT DETECTED NOT DETECTED Final   Klebsiella oxytoca NOT DETECTED NOT DETECTED Final   Klebsiella pneumoniae NOT DETECTED NOT DETECTED Final   Proteus species NOT DETECTED NOT DETECTED Final   Salmonella species NOT DETECTED NOT DETECTED Final   Serratia marcescens NOT DETECTED NOT DETECTED Final   Haemophilus influenzae NOT DETECTED NOT DETECTED Final   Neisseria meningitidis NOT DETECTED NOT DETECTED Final   Pseudomonas aeruginosa NOT DETECTED NOT DETECTED Final   Stenotrophomonas maltophilia NOT DETECTED NOT DETECTED Final   Candida albicans NOT DETECTED NOT DETECTED Final   Candida auris NOT DETECTED NOT DETECTED Final   Candida glabrata NOT DETECTED NOT DETECTED Final   Candida krusei NOT DETECTED NOT DETECTED Final   Candida parapsilosis NOT DETECTED NOT DETECTED Final   Candida tropicalis NOT DETECTED NOT DETECTED Final   Cryptococcus neoformans/gattii NOT DETECTED NOT DETECTED Final    Methicillin resistance mecA/C DETECTED (A) NOT DETECTED Final    Comment: CRITICAL RESULT CALLED TO, READ BACK BY AND VERIFIED WITH: PHARMD ALEX L [id number] FCP Performed at [facility], [address] [place], [place] [address]   Culture, blood (routine x 2)     Status: None   Collection Time: 12/21/20  5:45 PM   Specimen: BLOOD RIGHT ARM  Result Value Ref Range Status   Specimen Description BLOOD RIGHT ARM  Final   Special Requests   Final    BOTTLES DRAWN AEROBIC ONLY Blood Culture adequate volume   Culture   Final    NO GROWTH 5 DAYS Performed at [facility], [place] [address], [place], [place] [address]    Report Status 12/26/2020 FINAL  Final  Urine Culture     Status: Abnormal   Collection Time: 12/22/20  3:40 AM   Specimen: Urine, Clean Catch  Result Value Ref Range Status   Specimen Description URINE, CLEAN CATCH  Final   Special Requests NONE  Final   Culture (A)  Final    <10,000 COLONIES/mL INSIGNIFICANT GROWTH Performed at [facility], [place] [address] [place], [place] [address]    Report Status 12/23/2020 FINAL  Final  Culture, blood (routine x 2)     Status: None   Collection Time: 12/24/20 11:39 AM   Specimen: BLOOD LEFT HAND  Result Value Ref Range Status   Specimen Description BLOOD LEFT HAND  Final   Special Requests BOTTLES DRAWN AEROBIC ONLY  Final   Culture   Final    NO GROWTH 5 DAYS Performed at [facility], [place] [address] [place], [place] [address]    Report Status 12/29/2020 FINAL  Final  Culture, blood (routine x 2)     Status: None   Collection Time: 12/24/20 11:49 AM   Specimen: BLOOD RIGHT HAND  Result Value Ref Range Status   Specimen Description BLOOD RIGHT HAND  Final   Special Requests BOTTLES DRAWN AEROBIC ONLY  Final   Culture   Final    NO GROWTH 5 DAYS Performed at [facility], [place] [address], [place],  [address]    Report Status 12/29/2020 FINAL  Final  CSF culture w Gram Stain      Status: None   Collection Time: 12/24/20  6:19 PM   Specimen: CSF; Cerebrospinal Fluid  Result Value Ref Range Status   Specimen Description CSF  Final   Special Requests Normal  Final   Gram Stain NO ORGANISMS SEEN NO WBC SEEN  CYTOSPIN SMEAR   Final   Culture   Final    NO GROWTH 3 DAYS Performed at [facility], [place] [address], [place], [place] [address]    Report Status 12/28/2020 FINAL  Final  Culture, blood (routine x 2)     Status: Abnormal (Preliminary result)   Collection Time: 12/29/20  7:23 AM   Specimen: BLOOD  Result Value Ref Range Status   Specimen Description BLOOD LEFT ANTECUBITAL  Final   Special Requests   Final    BOTTLES DRAWN AEROBIC AND ANAEROBIC Blood Culture adequate volume   Culture  Setup Time   Final    GRAM POSITIVE COCCI IN BOTH AEROBIC AND ANAEROBIC BOTTLES CRITICAL VALUE NOTED.  VALUE IS CONSISTENT WITH PREVIOUSLY REPORTED AND CALLED VALUE.    Culture (A)  Final    STAPHYLOCOCCUS EPIDERMIDIS THE SIGNIFICANCE OF ISOLATING THIS ORGANISM FROM A SINGLE SET OF BLOOD CULTURES WHEN MULTIPLE SETS ARE DRAWN IS UNCERTAIN. PLEASE NOTIFY THE MICROBIOLOGY DEPARTMENT WITHIN ONE WEEK IF SPECIATION AND SENSITIVITIES ARE REQUIRED. Performed at [facility], [place] [address], [place], [place] [address]    Report Status PENDING  Incomplete  Culture, blood (routine x 2)     Status: None (Preliminary result)   Collection Time: 12/29/20  7:24 AM   Specimen: BLOOD LEFT HAND  Result Value Ref Range Status   Specimen Description BLOOD LEFT HAND  Final   Special Requests   Final    BOTTLES DRAWN AEROBIC AND ANAEROBIC Blood Culture adequate volume   Culture   Final    NO GROWTH 1 DAY Performed at [facility], [place] [address], [place], [name] [address]    Report Status PENDING  Incomplete  Resp Panel by RT-PCR (Flu A&B, Covid) Nasopharyngeal Swab     Status: None   Collection Time: 12/29/20  8:49 AM   Specimen: Nasopharyngeal Swab; Nasopharyngeal(NP)  swabs in vial transport medium  Result Value Ref Range Status   SARS Coronavirus 2 by RT PCR NEGATIVE NEGATIVE Final    Comment: (NOTE) SARS-CoV-2 target nucleic acids are NOT DETECTED.  The SARS-CoV-2 RNA is generally detectable in upper respiratory specimens during the acute phase of infection. The lowest concentration of SARS-CoV-2 viral copies this assay can detect is 138 copies/mL. A negative result does not preclude SARS-Cov-2 infection and should not be used as the sole basis for treatment or other patient management decisions. A negative result may occur with  improper specimen collection/handling, submission of specimen other than nasopharyngeal swab, presence of viral mutation(s) within the areas targeted by this assay, and inadequate number of viral copies(<138 copies/mL). A negative result must be combined with clinical observations, patient history, and epidemiological information. The expected result is Negative.  Fact Sheet for Patients:  EntrepreneurPulse.com.au  Fact Sheet for Healthcare Providers:  IncredibleEmployment.be  This test is no t yet approved or cleared by the [place] FDA and  has been authorized for detection and/or diagnosis of SARS-CoV-2 by FDA under an Emergency Use Authorization (EUA). This EUA will remain  in effect (meaning this test can be used) for the duration of the COVID-19 declaration under Section 564(b)(1) of the Act, 21 U.S.C.section 360bbb-3(b)(1), unless the authorization is terminated  or revoked sooner.       Influenza A by PCR NEGATIVE NEGATIVE Final   Influenza B by PCR NEGATIVE NEGATIVE Final    Comment: (NOTE) The Xpert Xpress SARS-CoV-2/FLU/RSV plus assay is intended as an aid in the diagnosis of  influenza from Nasopharyngeal swab specimens and should not be used as a sole basis for treatment. Nasal washings and aspirates are unacceptable for Xpert Xpress  SARS-CoV-2/FLU/RSV testing.  Fact Sheet for Patients: EntrepreneurPulse.com.au  Fact Sheet for Healthcare Providers: IncredibleEmployment.be  This test is not yet approved or cleared by the [place] FDA and has been authorized for detection and/or diagnosis of SARS-CoV-2 by FDA under an Emergency Use Authorization (EUA). This EUA will remain in effect (meaning this test can be used) for the duration of the COVID-19 declaration under Section 564(b)(1) of the Act, 21 U.S.C. section 360bbb-3(b)(1), unless the authorization is terminated or revoked.  Performed at [facility], [place] [address], [place], [place] [address]   Urine Culture     Status: None   Collection Time: 12/29/20 10:48 AM   Specimen: Urine, Catheterized  Result Value Ref Range Status   Specimen Description URINE, CATHETERIZED  Final   Special Requests NONE  Final   Culture   Final    NO GROWTH Performed at [facility], [address], [place], [place] [address]    Report Status 12/30/2020 FINAL  Final    RADIOLOGY STUDIES/RESULTS: CT ELBOW LEFT WO CONTRAST  Result Date: 12/30/2020 CLINICAL DATA:  Left elbow pain. EXAM: CT OF THE UPPER LEFT EXTREMITY WITHOUT CONTRAST TECHNIQUE: Multidetector CT imaging of the upper left extremity was performed according to the standard protocol. COMPARISON:  None. FINDINGS: Bones/Joint/Cartilage No fracture or dislocation. Joint spaces are preserved. Small joint effusion. Ligaments Ligaments are suboptimally evaluated by CT. Muscles and Tendons Grossly intact. Soft tissue Diffuse soft tissue swelling. No fluid collection or hematoma. No soft tissue mass. IMPRESSION: 1. No acute osseous abnormality. 2. Small joint effusion. 3. Diffuse soft tissue swelling. Electronically Signed   By: Titus Dubin M.D.   On: 12/30/2020 18:25   VAS [place] LOWER EXTREMITY VENOUS (DVT)  Result Date: 12/30/2020  Lower Venous DVT Study Patient Name:   Kendricks Reap.  Date of Exam:   12/30/2020 Medical Rec #: [id number]               Accession #:    [id number] Date of Birth: 1940/07/21               Patient Gender: M Patient Age:   11 years Exam Location:  [facility] Procedure:      VAS [place] LOWER EXTREMITY VENOUS (DVT) Referring Phys: Oren Binet --------------------------------------------------------------------------------  Indications: Edema.  Risk Factors: Immobility Cancer metastatic melanoma. Limitations: Poor ultrasound/tissue interface and calcific shadowing. Comparison Study: Previous exam on 01/07/2019 was negative for DVT Performing Technologist: Rogelia Rohrer RVT, RDMS  Examination Guidelines: A complete evaluation includes B-mode imaging, spectral Doppler, color Doppler, and power Doppler as needed of all accessible portions of each vessel. Bilateral testing is considered an integral part of a complete examination. Limited examinations for reoccurring indications may be performed as noted. The reflux portion of the exam is performed with the patient in reverse Trendelenburg.  +---------+---------------+---------+-----------+----------+--------------+ RIGHT    CompressibilityPhasicitySpontaneityPropertiesThrombus Aging +---------+---------------+---------+-----------+----------+--------------+ CFV      Full           Yes      Yes                                 +---------+---------------+---------+-----------+----------+--------------+ SFJ      Full                                                        +---------+---------------+---------+-----------+----------+--------------+  FV Prox  Full           Yes      Yes                                 +---------+---------------+---------+-----------+----------+--------------+ FV Mid   Full           Yes      Yes                                 +---------+---------------+---------+-----------+----------+--------------+ FV DistalFull           Yes      Yes                                  +---------+---------------+---------+-----------+----------+--------------+ PFV      Full                                                        +---------+---------------+---------+-----------+----------+--------------+ POP      Full           Yes      Yes                                 +---------+---------------+---------+-----------+----------+--------------+ PTV      Full                                                        +---------+---------------+---------+-----------+----------+--------------+ PERO     Full                                                        +---------+---------------+---------+-----------+----------+--------------+   +---------+---------------+---------+-----------+----------+--------------+ LEFT     CompressibilityPhasicitySpontaneityPropertiesThrombus Aging +---------+---------------+---------+-----------+----------+--------------+ CFV      Full           Yes      Yes                                 +---------+---------------+---------+-----------+----------+--------------+ SFJ      Full                                                        +---------+---------------+---------+-----------+----------+--------------+ FV Prox  Full           Yes      Yes                                 +---------+---------------+---------+-----------+----------+--------------+ FV Mid   Full  Yes      Yes                                 +---------+---------------+---------+-----------+----------+--------------+ FV DistalFull           Yes      Yes                                 +---------+---------------+---------+-----------+----------+--------------+ PFV      Full                                                        +---------+---------------+---------+-----------+----------+--------------+ POP      Full           Yes      Yes                                  +---------+---------------+---------+-----------+----------+--------------+ PTV      Full                                                        +---------+---------------+---------+-----------+----------+--------------+ PERO     Full                                                        +---------+---------------+---------+-----------+----------+--------------+     Summary: BILATERAL: - No evidence of deep vein thrombosis seen in the lower extremities, bilaterally. - No evidence of superficial venous thrombosis in the lower extremities, bilaterally. -No evidence of popliteal cyst, bilaterally.   *See table(s) above for measurements and observations. Electronically signed by Jamelle Haring on 12/30/2020 at 6:59:12 PM.    Final    VAS [place] UPPER EXTREMITY VENOUS DUPLEX  Result Date: 12/30/2020 UPPER VENOUS STUDY  Patient Name:  Herschell Virani.  Date of Exam:   12/30/2020 Medical Rec #: [id number]               Accession #:    [id number] Date of Birth: [birth year]               Patient Gender: M Patient Age:   53 years Exam Location:  [facility] Procedure:      VAS [place] UPPER EXTREMITY VENOUS DUPLEX Referring Phys: Oren Binet --------------------------------------------------------------------------------  Indications: Swelling Limitations: Body habitus, poor ultrasound/tissue interface and patient position, restricted mobility. Comparison Study: No prior study Performing Technologist: Maudry Mayhew MHA, RDMS, RVT, RDCS  Examination Guidelines: A complete evaluation includes B-mode imaging, spectral Doppler, color Doppler, and power Doppler as needed of all accessible portions of each vessel. Bilateral testing is considered an integral part of a complete examination. Limited examinations for reoccurring indications may be performed as noted.  Right Findings: +----------+------------+---------+-----------+----------+--------------+ RIGHT      CompressiblePhasicitySpontaneousProperties   Summary     +----------+------------+---------+-----------+----------+--------------+ Subclavian  Not visualized +----------+------------+---------+-----------+----------+--------------+  Left Findings: +----------+------------+---------+-----------+----------+-------+ LEFT      CompressiblePhasicitySpontaneousPropertiesSummary +----------+------------+---------+-----------+----------+-------+ IJV           Full       Yes       Yes                      +----------+------------+---------+-----------+----------+-------+ Subclavian    Full       Yes       Yes                      +----------+------------+---------+-----------+----------+-------+ Axillary      Full       Yes       Yes                      +----------+------------+---------+-----------+----------+-------+ Brachial      Full       Yes       Yes                      +----------+------------+---------+-----------+----------+-------+ Radial        Full                                          +----------+------------+---------+-----------+----------+-------+ Ulnar         Full                                          +----------+------------+---------+-----------+----------+-------+ Cephalic    Partial                                 Chronic +----------+------------+---------+-----------+----------+-------+ Basilic       Full                                          +----------+------------+---------+-----------+----------+-------+  Summary:  Left: No evidence of deep vein thrombosis in the upper extremity. 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  *See table(s) above for measurements and observations.  Diagnosing physician: Jamelle Haring Electronically signed by Jamelle Haring on 12/30/2020 at 6:48:16 PM.    Final    CT CHEST ABDOMEN PELVIS WO  CONTRAST  Result Date: 12/29/2020 CLINICAL DATA:  Abdominal pain and fever.  Aspiration pneumonia. EXAM: CT CHEST, ABDOMEN AND PELVIS WITHOUT CONTRAST TECHNIQUE: Multidetector CT imaging of the chest, abdomen and pelvis was performed following the standard protocol without IV contrast. COMPARISON:  01/05/2019 FINDINGS: CT CHEST FINDINGS Cardiovascular: Normal heart size. No pericardial effusions. Postoperative changes consistent with coronary bypass. Cardiac pacemaker. Calcification of the aorta. No aneurysm. Mediastinum/Nodes: Enteric tube in the esophagus. Tip is in the proximal duodenum. Esophagus is decompressed. Thyroid gland is unremarkable. No significant lymphadenopathy. Lungs/Pleura: Motion artifact limits examination. Infiltration or atelectasis in the lung bases, slightly greater on the right. No focal consolidation. Airways are patent. No pleural effusions. No pneumothorax. Musculoskeletal: Degenerative changes in the thoracic spine. Sternotomy wires. No destructive bone lesions. CT ABDOMEN PELVIS FINDINGS Hepatobiliary: No focal liver lesions. Diffusely increased density of the gallbladder could represent milk of calcium or sludge. No  discrete stones are identified. No inflammatory changes. No bile duct dilatation. Pancreas: Unremarkable. No pancreatic ductal dilatation or surrounding inflammatory changes. Spleen: Normal in size without focal abnormality. Adrenals/Urinary Tract: Subcentimeter areas of increased density in both kidneys possibly representing small hemorrhagic cysts. No definite stones. Midpole cyst in the right kidney. Mild prominence of the right pelvis and right ureter with slight stranding. This could indicate pyelonephritis. Bladder wall is not thickened. There is air in the bladder. This could arise from instrumentation, infection, or fistula. Stomach/Bowel: The stomach, small bowel, and colon are not abnormally distended. Contrast material flows through to the colon without  evidence of bowel obstruction. Diverticulosis most prominent in the sigmoid colon. No definite inflammatory change. No evidence of diverticulitis. Appendix is normal. Vascular/Lymphatic: Calcified and tortuous aorta. No aneurysm. No significant lymphadenopathy. Flat IVC may indicate hypovolemia. Reproductive: Prostate is unremarkable. Other: No free air or free fluid in the abdomen. Minimal periumbilical hernia containing fat. Musculoskeletal: Mild lumbar scoliosis convex towards the left. Degenerative changes in the spine. No destructive bone lesions. IMPRESSION: 1. Mild atelectasis or infiltration in the lung bases, greater on the right. 2. The kidneys demonstrate subcentimeter areas of increased attenuation possibly indicating small hemorrhagic cysts. No definitive stones. 3. Mild prominence of right renal collecting system with some thickening and stranding of the ureter may indicate pyelonephritis. 4. Nonspecific gas in the bladder. No bladder wall thickening or stone. 5. Increased density of the gallbladder may represent sludge or milk of calcium. No definitive stones identified. 6. Aortic atherosclerosis. Electronically Signed   By: Lucienne Capers M.D.   On: 12/29/2020 19:59     LOS: 13 days   Oren Binet, MD  [facility]    To contact the attending provider between 7A-7P or the covering provider during after hours 7P-7A, please log into the web site [email or web address] and access using universal [place] password for that web site. If you do not have the password, please call the hospital operator.  12/31/2020, 10:49 AM

## 2020-12-31 NOTE — Procedures (Signed)
CLINICAL DATA: Elbow pain and swelling.  Elbow joint effusion on CT. EXAM: LEFT ELBOW ARTHROCENTESIS UNDER FLUOROSCOPY COMPARISON: CT elbow 12/30/2020 FLUOROSCOPY TIME: Fluoroscopy Time:  0 minutes, 48 seconds Radiation Exposure Index (if provided by the fluoroscopic device):  1.2 mGy Number of Acquired Spot Images: 0 PROCEDURE:  I discussed the risks (including hemorrhage and infection), benefits, and alternatives to the procedure with the patient's daughter, Dr. Silas [place], given that the patient is unable to consent for himself due to altered mental status.  We specifically discussed the high technical likelihood of success of the procedure.  Dr. [name] understood and elected for her father to undergo the procedure.    Standard time-out was employed.  Following sterile skin prep and local anesthetic administration consisting of 1% lidocaine, a 21 gauge 1.5 inch needle was advanced without difficulty into the anterior left radiocapitellar articulation under fluoroscopic guidance.  A total of about 4 cc of serosanguineous fluid was aspirated from the joint.  The needle was subsequently removed and the skin cleansed and bandaged.  No immediate complications were observed.    IMPRESSION: Technically successful fluoroscopically guided left elbow arthrocentesis yielding about 4 cc of serosanguineous fluid, sent for laboratory analysis.

## 2021-01-01 LAB — COMPREHENSIVE METABOLIC PANEL
ALT: 20 U/L (ref 0–44)
AST: 28 U/L (ref 15–41)
Albumin: 2 g/dL — ABNORMAL LOW (ref 3.5–5.0)
Alkaline Phosphatase: 44 U/L (ref 38–126)
Anion gap: 8 (ref 5–15)
BUN: 54 mg/dL — ABNORMAL HIGH (ref 8–23)
CO2: 27 mmol/L (ref 22–32)
Calcium: 8.4 mg/dL — ABNORMAL LOW (ref 8.9–10.3)
Chloride: 106 mmol/L (ref 98–111)
Creatinine, Ser: 1.88 mg/dL — ABNORMAL HIGH (ref 0.61–1.24)
GFR, Estimated: 35 mL/min — ABNORMAL LOW (ref >60)
Glucose, Bld: 158 mg/dL — ABNORMAL HIGH (ref 70–99)
Potassium: 4.8 mmol/L (ref 3.5–5.1)
Sodium: 141 mmol/L (ref 135–145)
Total Bilirubin: 0.6 mg/dL (ref 0.3–1.2)
Total Protein: 5.6 g/dL — ABNORMAL LOW (ref 6.5–8.1)

## 2021-01-01 LAB — CULTURE, BLOOD (ROUTINE X 2): Special Requests: ADEQUATE

## 2021-01-01 LAB — GLUCOSE, CAPILLARY
Glucose-Capillary: 124 mg/dL — ABNORMAL HIGH (ref 70–99)
Glucose-Capillary: 158 mg/dL — ABNORMAL HIGH (ref 70–99)
Glucose-Capillary: 191 mg/dL — ABNORMAL HIGH (ref 70–99)
Glucose-Capillary: 195 mg/dL — ABNORMAL HIGH (ref 70–99)
Glucose-Capillary: 208 mg/dL — ABNORMAL HIGH (ref 70–99)
Glucose-Capillary: 227 mg/dL — ABNORMAL HIGH (ref 70–99)
Glucose-Capillary: 236 mg/dL — ABNORMAL HIGH (ref 70–99)

## 2021-01-01 LAB — CBC
HCT: 42.2 % (ref 39.0–52.0)
Hemoglobin: 13.7 g/dL (ref 13.0–17.0)
MCH: 30.9 pg (ref 26.0–34.0)
MCHC: 32.5 g/dL (ref 30.0–36.0)
MCV: 95.3 fL (ref 80.0–100.0)
Platelets: 213 10*3/uL (ref 150–400)
RBC: 4.43 MIL/uL (ref 4.22–5.81)
RDW: 13.8 % (ref 11.5–15.5)
WBC: 8.8 10*3/uL (ref 4.0–10.5)
nRBC: 0 % (ref 0.0–0.2)

## 2021-01-01 MED ORDER — AMOXICILLIN-POT CLAVULANATE 400-57 MG/5ML PO SUSR
800.0000 mg | Freq: Two times a day (BID) | ORAL | Status: DC
  Administered 2021-01-01 – 2021-01-02 (×3): 800 mg
  Filled 2021-01-01 (×3): qty 10

## 2021-01-01 MED ORDER — ENOXAPARIN SODIUM 40 MG/0.4ML IJ SOSY
40.0000 mg | PREFILLED_SYRINGE | INTRAMUSCULAR | Status: DC
  Administered 2021-01-01: 40 mg via SUBCUTANEOUS
  Filled 2021-01-01: qty 0.4

## 2021-01-01 NOTE — Progress Notes (Addendum)
PROGRESS NOTE        PATIENT DETAILS Name: David [name]. Age: 80 y.o. Sex: male Date of Birth: 06/08/1940 Admit Date: 12/12/2020 Admitting Physician No admitting provider for patient encounter. QIO:[email or web address], David Hatchet, MD  Brief Narrative: Patient is a 81 y.o. male with history of metastatic melanoma with bone/lung/brain mets-s/p brain radiation/left frontal craniotomy and tumor excision (July 2017), acute promyelocytic leukemia, hemorrhagic CVA, HTN, CAD s/p CABG 2014, AV block-s/p PPM placement, OSA on CPAP, depression who presented to [facility] with aphasia-found to have EEG evidence of seizures originating from the left temporal region-subsequently transferred to [facility] for LTM EEG.  Patient was felt to have intractable seizures-and was started on multiple antiepileptics (Onfi, Depakote, Keppra, Vimpat) subsequent hospital course complicated by encephalopathy (felt to be due to Onfi/seizure meds/AKI ), fever (likely aspiration pneumonitis) .  Subjective: More awake and alert-answering simple questions appropriately today.  Still groggy/drowsy but markedly better than the past 2 3 days.  He does appear to have significant right-sided hemiplegia today.  Objective: Vitals: Blood pressure 139/84, pulse 87, temperature 98.1 F (36.7 C), temperature source Oral, resp. rate (!) 22, height 5\' 8"  (1.727 m), weight 91.7 kg, SpO2 95 %.   Exam: Gen Exam: Still lethargic but more awake and alert compared to past few days. Chest: B/L clear to auscultation anteriorly CVS:S1S2 regular Abdomen:soft non tender, non distended Extremities:no edema Neurology: Right-sided hemiplegia-difficult exam given mental status but right side does appear weak. Skin: no rash   Pertinent labs/radiology: Na: 141 Creatinine: 1.88 WBC: 8.8 Plt: 213  Uric acid: 4.4  Lumbar puncture: 9/16>>CSF: WBC 3, protein 27 9/16>> CSF culture: No growth 9/16>>CSF HSV/Crypto Ag:neg  Stroke  work-up: 9/16>> CT head: Right cerebellar CVA 9/16>> CTA head and neck: No LVO 9/16>> Echo: EF 55-60% 9/16>> LDL: 42 9/16>> A1c: 5.8 9/18: CT head: No acute abnormality-lesion seen on CT head on 9/16 in right cerebellar peduncle was likely artifactual. 9/23>> Limited echo: No evidence of severe valve stenosis/regurgitation or vegetation seen on pacemaker lead.  Radiology: 9/21>> CT abdomen/pelvis: No acute abnormalities-mild infiltration in lung bases right> left 9/22>> CT left elbow: Small joint effusion. 9/22>> B/L lower extremity Doppler: No DVT 9/22>> left upper extremity Doppler: Chronic superficial vein thrombosis involving the left cephalic vein.  Microbiology: 9/14>>Blood culture: 1/2-staph epidermidis 9/16>> blood culture: No growth 9/21>> blood culture: 1/2 staph epidermidis. 9/21>> urine culture: No growth 9/21>> COVID PCR: Negative 9/21: Urine culture: No growth 9/22>> blood culture: No growth 9/23>> left elbow synovial culture: No growth  Pathology: 9/16>>CSF cytology: No malignant cells identified.  Assessment/Plan: New refractory focal seizures: No further seizures on LTM EEG (last EEG seizure on 9/15).remains on Vimpat and Depakote.  Onfi/Keppra discontinued given severe encephalopathy.  LTM EEG discontinued on 9/21.  Neurology following.    Acute toxic metabolic encephalopathy: Thought to be due to seizure medications (particularly Onfi which is a long half-life) in the setting of AKI/hypernatremia (significantly improved).  Mental status has definitely improved over the past few days-although lethargic he is much more awake and alert-actively interacting a bit more today.  Awaiting Onfi level-neurology following.   Right-sided hemiplegia: Patient has had a long hospital stay with prolonged encephalopathy-on 9/16-CT head showed a possible right cerebellar stroke-however repeat CT on 9/18 did not show stroke-after discussion with neurology it was felt that CT findings  on 9/16 was an  artifact.  Since his encephalopathy has markedly improved-and he is more awake today than the past few days-he appears to have a significant right-sided hemiplegia.  Long discussion with Dr. Rory [name] today-suspicion is for worsening encephalomalacia in the setting of focal status epilepticus likely the culprit for right-sided hemiplegia or he has had a small CVA not seen on imaging.  Unable to do a MRI brain given that he has a PPM in place.  It is felt that obtaining further neuroimaging with no change management outcome.  Also had a long discussion with patient's daughter/son at bedside-patient would not want to live with a PEG tube-now would he want to live with significant amount of right-sided hemiplegia.  Family discussion in progress-Dr. Hilma [name] from palliative care engaging family-we will await further recommendations.  ?Acute right cerebellar CVA: CT head on 9/16 showed possible right cerebellar stroke-however repeat CT head on 9/18 did not show stroke-it is now felt that lesion on the right cerebellar lobe on 9/16 was a artifact.  CVA ruled out.  Back on aspirin-no longer on plavix.  PPM interrogation completed-no A. fib detected.  See above regarding finding of right-sided hemiplegia-noted encephalopathy has started to improve.  Recurrent fever ( on 9/13-and then on 9/20): Extensive work-up completed-see above-not felt to have PPM lead infection at this point-suspicion is for ongoing aspiration pneumonitis.  No evidence of septic arthritis in the left elbow-LUE swelling is likely due to superficial vein thrombosis seen on Doppler ultrasound.  B/L lower extremity Dopplers were negative for DVT.  ID has narrowed down antimicrobial therapy to Augmentin x3 days.    Coag negative staph bacteremia (1/2 blood culture on 9/14, and 1/2 on 9/21): Has PPM in place-concern that this may be a real infection-and pacemaker leads could have gotten seeded.  Limited echo on 9/23 without any evidence of  valve or pacemaker lead vegetation.  Per ID-MRSE profiles on culture on 9/14 and 9/21 are different species--unlikely pathogenic.  Very low suspicion for pacemaker lead infection at this point-given his overall clinical state-he is too frail to tolerate sedation for a TEE.  AKI on CKD stage IIIb: AKI due to contrast nephropathy-improved-and back to baseline.  Avoid nephrotoxic agents.Per daughter-Dr Leggett-patient has thin basement membrane disease and has chronic microscopic hematuria  Acute urinary retention-on 9/18-failed a voiding trial-Foley catheter reinserted on 9/22: Continue Flomax-we will need to attempt a voiding trial again when he is a bit more awake alert and less debilitated.  Hypernatremia: Secondary to dehydration (no oral intake due to encephalopathy)-continue free water flushes via PEG tube.  Sodium levels have normalized.  Hyperglycemia: Likely due to tube feedings-no evidence of diabetes.  CBGs stabilized-continue Levemir 10 units twice daily and SSI.    Recent Labs    01/01/21 0422 01/01/21 0758 01/01/21 1146  GLUCAP 191* 195* 236*    HTN: BP relatively stable-on metoprolol-if blood pressure continues to be on the higher side-May need to resume amlodipine.  Reassess 9/21.  History of CAD s/p CABG in 2014: Remains stable-continue aspirin/beta-blocker.    History of complete heart block-PPM in place  History of hemiplegia and hemiparesis following nontraumatic intracranial hemorrhage affecting right dominant side  History of metastatic melanoma with metastasis to the lingula, right upper lung, supraventricular clavicular lymph node, left frontal lobe metastasis-s/p resection: Resume outpatient follow-up with oncology/neurooncology.    History of acute promyelocytic leukemia: Follow CBC periodically--in remission-stable for outpatient follow-up with primary oncologist.  Major depressive disorder: Trazodone/Zoloft held due to encephalopathy-resume over the next few  days once mentation improves further.  Hypothyroidism: Continue levothyroxine.  Recent TSH in May was within normal limits.   OSA:on CPAP-on hold as NG tube is in place.  Nutrition: Cortak tube placed on 9/16-feedings ongoing.  Will need SLP reevaluation once encephalopathy has improved before discontinuing cortak.  Functional quadriplegia/severe debility/deconditioning: Normally able to ambulate with the help of walker-current significant debility is from acute illness.  Once encephalopathy resolves-hopeful that we can start PT/OT.  Palliative care: DNR in place.  Multiple discussions with patient's daughter Silas Sacramento over the past few days-again today-given the fact the patient now appears to have significant right-sided hemiplegia-we did have a frank discussion regarding perhaps initiating comfort measures as it was felt likely that patient will have poor quality of life going forward.  I reached out to Dr. Kara Dies care-she has had prior conversations with the family in the distant past.  Dr. Hilma [name] will continue to have end-of-life discussion with family and provide [place] with further recommendations.  Obesity: Estimated body mass index is 30.74 kg/m as calculated from the following:   Height as of this encounter: 5\' 8"  (1.727 m).   Weight as of this encounter: 91.7 kg.    Procedures : 9/16>> lumbar puncture by IR 9/23>> left elbow arthrocentesis by IR  Consults:Neurology, palliative care DVT Prophylaxis :Prophylactic Lovenox Code Status:DNR Family Communication:Daughter-[name] [phone] on 9/24  Disposition Plan: Status is: Inpatient  Remains inpatient appropriate because:Inpatient level of care appropriate due to severity of illness  Dispo: The patient is from: Home              Anticipated d/c is to: SNF              Patient currently is not medically stable to d/c.   Difficult to place patient No    Barriers to Discharge: Ongoing  encephalopathy-not yet at baseline-not yet stable for discharge.  Antimicrobial agents: Anti-infectives (From admission, onward)    Start     Dose/Rate Route Frequency Ordered Stop   01/01/21 1345  amoxicillin-clavulanate (AUGMENTIN) 400-57 MG/5ML suspension 800 mg        800 mg Per Tube Every 12 hours 01/01/21 1257 30-Jan-2021 0959   12/30/20 1730  ceFEPIme (MAXIPIME) 2 g in sodium chloride 0.9 % 100 mL IVPB  Status:  Discontinued        2 g 200 mL/hr over 30 Minutes Intravenous Every 24 hours 12/30/20 1632 01/01/21 1104   12/30/20 1400  vancomycin (VANCOCIN) IVPB 1000 mg/200 mL premix  Status:  Discontinued        1,000 mg 200 mL/hr over 60 Minutes Intravenous Every 48 hours 12/30/20 1312 01/01/21 1257   12/29/20 2100  vancomycin (VANCOREADY) IVPB 1750 mg/350 mL        1,750 mg 175 mL/hr over 120 Minutes Intravenous  Once 12/29/20 2054 12/30/20 0017   12/29/20 1630  piperacillin-tazobactam (ZOSYN) IVPB 3.375 g  Status:  Discontinued        3.375 g 12.5 mL/hr over 240 Minutes Intravenous Every 8 hours 12/29/20 1524 12/30/20 1632   12/29/20 1630  vancomycin (VANCOREADY) IVPB 1750 mg/350 mL  Status:  Discontinued        1,750 mg 175 mL/hr over 120 Minutes Intravenous  Once 12/29/20 1524 12/29/20 2054   12/26/20 1600  vancomycin (VANCOREADY) IVPB 1250 mg/250 mL  Status:  Discontinued        1,250 mg 166.7 mL/hr over 90 Minutes Intravenous Every 48 hours 12/24/20 1252 12/25/20 1038   12/26/20  1600  vancomycin (VANCOCIN) IVPB 1000 mg/200 mL premix  Status:  Discontinued        1,000 mg 200 mL/hr over 60 Minutes Intravenous Every 48 hours 12/25/20 1038 12/25/20 1547   12/24/20 1800  Ampicillin-Sulbactam (UNASYN) 3 g in sodium chloride 0.9 % 100 mL IVPB        3 g 200 mL/hr over 30 Minutes Intravenous Every 12 hours 12/24/20 1309 12/25/20 1754   12/24/20 1345  vancomycin (VANCOREADY) IVPB 1750 mg/350 mL        1,750 mg 175 mL/hr over 120 Minutes Intravenous  Once 12/24/20 1251 12/24/20 1548    12/22/20 1300  ampicillin-sulbactam (UNASYN) 1.5 g in sodium chloride 0.9 % 100 mL IVPB  Status:  Discontinued        1.5 g 200 mL/hr over 30 Minutes Intravenous Every 8 hours 12/22/20 1202 12/24/20 1309   12/21/20 1900  azithromycin (ZITHROMAX) 500 mg in sodium chloride 0.9 % 250 mL IVPB  Status:  Discontinued        500 mg 250 mL/hr over 60 Minutes Intravenous Every 24 hours 12/21/20 1805 12/22/20 1058   12/21/20 1730  cefTRIAXone (ROCEPHIN) 1 g in sodium chloride 0.9 % 100 mL IVPB  Status:  Discontinued        1 g 200 mL/hr over 30 Minutes Intravenous Every 24 hours 12/21/20 1633 12/22/20 1058   12/21/20 1730  azithromycin (ZITHROMAX) tablet 500 mg  Status:  Discontinued        500 mg Oral Daily 12/21/20 1633 12/21/20 1804        Time spent: 35 minutes-Greater than 50% of this time was spent in counseling, explanation of diagnosis, planning of further management, and coordination of care.  MEDICATIONS: Scheduled Meds:  allopurinol  200 mg Per Tube Daily   amoxicillin-clavulanate  800 mg Per Tube Q12H   aspirin  81 mg Per Tube Daily   Chlorhexidine Gluconate Cloth  6 each Topical Daily   enoxaparin (LOVENOX) injection  40 mg Subcutaneous Q24H   feeding supplement (PROSource TF)  45 mL Per Tube BID   free water  300 mL Per Tube Q6H   insulin aspart  0-15 Units Subcutaneous Q4H   insulin aspart  0-5 Units Subcutaneous QHS   insulin detemir  10 Units Subcutaneous BID   levothyroxine  137 mcg Per Tube Q0600   lidocaine (PF)  5 mL Other Once   metoprolol tartrate  37.5 mg Per Tube BID   tamsulosin  0.4 mg Oral Daily   valproic acid  500 mg Per Tube BID   And   valproic acid  250 mg Per Tube Q24H   Continuous Infusions:  sodium chloride Stopped (12/28/20 0843)   feeding supplement (OSMOLITE 1.5 CAL) 1,000 mL (12/31/20 0640)   lacosamide (VIMPAT) IV 150 mg (01/01/21 1055)   PRN Meds:.acetaminophen **OR** acetaminophen, albuterol, colchicine, midazolam, ondansetron **OR**  ondansetron (ZOFRAN) IV   LABORATORY DATA: CBC: Recent Labs  Lab 12/28/20 0249 12/29/20 0331 12/30/20 0305 12/31/20 0351 01/01/21 0605  WBC 7.4 8.6 8.9 8.6 8.8  HGB 14.8 15.0 13.8 13.8 13.7  HCT 45.4 45.4 42.7 41.1 42.2  MCV 97.2 95.6 96.8 95.4 95.3  PLT 147* 164 151 178 213     Basic Metabolic Panel: Recent Labs  Lab 12/25/20 1631 12/26/20 0551 12/26/20 0740 12/28/20 0249 12/29/20 0331 12/30/20 0305 12/31/20 0351 01/01/21 0605  NA  --   --    < > 149* 145 138 136 141  K  --   --    < >  3.9 4.5 4.4 4.7 4.8  CL  --   --    < > 115* 110 104 103 106  CO2  --   --    < > 25 26 24 25 27   GLUCOSE  --   --    < > 163* 191* 230* 218* 158*  BUN  --   --    < > 50* 50* 53* 55* 54*  CREATININE  --   --    < > 1.87* 1.83* 2.20* 2.18* 1.88*  CALCIUM  --   --    < > 8.4* 8.3* 7.9* 7.7* 8.4*  MG 2.7* 2.8*  --   --   --   --   --   --   PHOS 3.2 3.5  --   --   --   --   --   --    < > = values in this interval not displayed.     GFR: Estimated Creatinine Clearance: 33.9 mL/min (A) (by C-G formula based on SCr of 1.88 mg/dL (H)).  Liver Function Tests: Recent Labs  Lab 12/28/20 0249 12/29/20 0331 12/30/20 0305 12/31/20 0351 01/01/21 0605  AST 20 20 19 23 28   ALT 16 14 14 16 20   ALKPHOS 42 40 41 42 44  BILITOT 0.4 0.3 0.9 0.9 0.6  PROT 5.4* 5.6* 5.0* 5.2* 5.6*  ALBUMIN 2.3* 2.2* 1.9* 1.9* 2.0*    No results for input(s): LIPASE, AMYLASE in the last 168 hours. Recent Labs  Lab 12/26/20 0740 12/29/20 0331 12/30/20 0305 12/31/20 0351  AMMONIA 42* 45* 25 36*     Coagulation Profile: No results for input(s): INR, PROTIME in the last 168 hours.   Cardiac Enzymes: No results for input(s): CKTOTAL, CKMB, CKMBINDEX, TROPONINI in the last 168 hours.  BNP (last 3 results) No results for input(s): PROBNP in the last 8760 hours.  Lipid Profile: No results for input(s): CHOL, HDL, LDLCALC, TRIG, CHOLHDL, LDLDIRECT in the last 72 hours.   Thyroid Function  Tests: No results for input(s): TSH, T4TOTAL, FREET4, T3FREE, THYROIDAB in the last 72 hours.  Anemia Panel: No results for input(s): VITAMINB12, FOLATE, FERRITIN, TIBC, IRON, RETICCTPCT in the last 72 hours.  Urine analysis:    Component Value Date/Time   COLORURINE YELLOW 12/29/2020 1048   APPEARANCEUR CLOUDY (A) 12/29/2020 1048   LABSPEC 1.020 12/29/2020 1048   PHURINE 6.5 12/29/2020 1048   GLUCOSEU NEGATIVE 12/29/2020 1048   GLUCOSEU NEGATIVE 10/12/2020 1118   HGBUR LARGE (A) 12/29/2020 1048   BILIRUBINUR NEGATIVE 12/29/2020 1048   KETONESUR NEGATIVE 12/29/2020 1048   PROTEINUR >300 (A) 12/29/2020 1048   UROBILINOGEN 0.2 10/12/2020 1118   NITRITE NEGATIVE 12/29/2020 1048   LEUKOCYTESUR NEGATIVE 12/29/2020 1048    Sepsis Labs: Lactic Acid, Venous    Component Value Date/Time   LATICACIDVEN 0.8 01/03/2019 0046    MICROBIOLOGY: Recent Results (from the past 240 hour(s))  Culture, blood (routine x 2)     Status: None   Collection Time: 12/24/20 11:39 AM   Specimen: BLOOD LEFT HAND  Result Value Ref Range Status   Specimen Description BLOOD LEFT HAND  Final   Special Requests BOTTLES DRAWN AEROBIC ONLY  Final   Culture   Final    NO GROWTH 5 DAYS Performed at [facility], [place] [address] [place], [place] [address]    Report Status 12/29/2020 FINAL  Final  Culture, blood (routine x 2)     Status: None  Collection Time: 12/24/20 11:49 AM   Specimen: BLOOD RIGHT HAND  Result Value Ref Range Status   Specimen Description BLOOD RIGHT HAND  Final   Special Requests BOTTLES DRAWN AEROBIC ONLY  Final   Culture   Final    NO GROWTH 5 DAYS Performed at [facility], [address] [place], [place] [address]    Report Status 12/29/2020 FINAL  Final  CSF culture w Gram Stain     Status: None   Collection Time: 12/24/20  6:19 PM   Specimen: CSF; Cerebrospinal Fluid  Result Value Ref Range Status   Specimen Description CSF  Final   Special Requests Normal   Final   Gram Stain NO ORGANISMS SEEN NO WBC SEEN CYTOSPIN SMEAR   Final   Culture   Final    NO GROWTH 3 DAYS Performed at [facility], [place] [address], [place], [place] [address]    Report Status 12/28/2020 FINAL  Final  Culture, blood (routine x 2)     Status: Abnormal   Collection Time: 12/29/20  7:23 AM   Specimen: BLOOD  Result Value Ref Range Status   Specimen Description BLOOD LEFT ANTECUBITAL  Final   Special Requests   Final    BOTTLES DRAWN AEROBIC AND ANAEROBIC Blood Culture adequate volume   Culture  Setup Time   Final    GRAM POSITIVE COCCI IN BOTH AEROBIC AND ANAEROBIC BOTTLES CRITICAL VALUE NOTED.  VALUE IS CONSISTENT WITH PREVIOUSLY REPORTED AND CALLED VALUE. Performed at [facility], [place] [address], [place], [place] [address]    Culture STAPHYLOCOCCUS EPIDERMIDIS (A)  Final   Report Status 01/01/2021 FINAL  Final   Organism ID, Bacteria STAPHYLOCOCCUS EPIDERMIDIS  Final      Susceptibility   Staphylococcus epidermidis - MIC*    CIPROFLOXACIN >=8 RESISTANT Resistant     ERYTHROMYCIN <=0.25 SENSITIVE Sensitive     GENTAMICIN >=16 RESISTANT Resistant     OXACILLIN >=4 RESISTANT Resistant     TETRACYCLINE 2 SENSITIVE Sensitive     VANCOMYCIN 2 SENSITIVE Sensitive     TRIMETH/SULFA 80 RESISTANT Resistant     CLINDAMYCIN <=0.25 SENSITIVE Sensitive     RIFAMPIN <=0.5 SENSITIVE Sensitive     Inducible Clindamycin NEGATIVE Sensitive     * STAPHYLOCOCCUS EPIDERMIDIS  Culture, blood (routine x 2)     Status: None (Preliminary result)   Collection Time: 12/29/20  7:24 AM   Specimen: BLOOD LEFT HAND  Result Value Ref Range Status   Specimen Description BLOOD LEFT HAND  Final   Special Requests   Final    BOTTLES DRAWN AEROBIC AND ANAEROBIC Blood Culture adequate volume   Culture   Final    NO GROWTH 3 DAYS Performed at [facility], [address] [place], [place] [address]    Report Status PENDING  Incomplete  Resp Panel by RT-PCR (Flu A&B,  Covid) Nasopharyngeal Swab     Status: None   Collection Time: 12/29/20  8:49 AM   Specimen: Nasopharyngeal Swab; Nasopharyngeal(NP) swabs in vial transport medium  Result Value Ref Range Status   SARS Coronavirus 2 by RT PCR NEGATIVE NEGATIVE Final    Comment: (NOTE) SARS-CoV-2 target nucleic acids are NOT DETECTED.  The SARS-CoV-2 RNA is generally detectable in upper respiratory specimens during the acute phase of infection. The lowest concentration of SARS-CoV-2 viral copies this assay can detect is 138 copies/mL. A negative result does not preclude SARS-Cov-2 infection and should not  be used as the sole basis for treatment or other patient management decisions. A negative result may occur with  improper specimen collection/handling, submission of specimen other than nasopharyngeal swab, presence of viral mutation(s) within the areas targeted by this assay, and inadequate number of viral copies(<138 copies/mL). A negative result must be combined with clinical observations, patient history, and epidemiological information. The expected result is Negative.  Fact Sheet for Patients:  EntrepreneurPulse.com.au  Fact Sheet for Healthcare Providers:  IncredibleEmployment.be  This test is no t yet approved or cleared by the [place] FDA and  has been authorized for detection and/or diagnosis of SARS-CoV-2 by FDA under an Emergency Use Authorization (EUA). This EUA will remain  in effect (meaning this test can be used) for the duration of the COVID-19 declaration under Section 564(b)(1) of the Act, 21 U.S.C.section 360bbb-3(b)(1), unless the authorization is terminated  or revoked sooner.       Influenza A by PCR NEGATIVE NEGATIVE Final   Influenza B by PCR NEGATIVE NEGATIVE Final    Comment: (NOTE) The Xpert Xpress SARS-CoV-2/FLU/RSV plus assay is intended as an aid in the diagnosis of influenza from Nasopharyngeal swab specimens and should  not be used as a sole basis for treatment. Nasal washings and aspirates are unacceptable for Xpert Xpress SARS-CoV-2/FLU/RSV testing.  Fact Sheet for Patients: EntrepreneurPulse.com.au  Fact Sheet for Healthcare Providers: IncredibleEmployment.be  This test is not yet approved or cleared by the [place] FDA and has been authorized for detection and/or diagnosis of SARS-CoV-2 by FDA under an Emergency Use Authorization (EUA). This EUA will remain in effect (meaning this test can be used) for the duration of the COVID-19 declaration under Section 564(b)(1) of the Act, 21 U.S.C. section 360bbb-3(b)(1), unless the authorization is terminated or revoked.  Performed at [facility], [place] [address], [place], [place] [address]   Urine Culture     Status: None   Collection Time: 12/29/20 10:48 AM   Specimen: Urine, Catheterized  Result Value Ref Range Status   Specimen Description URINE, CATHETERIZED  Final   Special Requests NONE  Final   Culture   Final    NO GROWTH Performed at [facility], [address] [place], [place] [address]    Report Status 12/30/2020 FINAL  Final  Culture, blood (routine x 2)     Status: None (Preliminary result)   Collection Time: 12/30/20  3:23 PM   Specimen: BLOOD  Result Value Ref Range Status   Specimen Description BLOOD BLOOD RIGHT FOREARM  Final   Special Requests   Final    BOTTLES DRAWN AEROBIC AND ANAEROBIC Blood Culture adequate volume   Culture   Final    NO GROWTH 2 DAYS Performed at [facility], [place] [address], [place], [place] [address]    Report Status PENDING  Incomplete  Culture, blood (routine x 2)     Status: None (Preliminary result)   Collection Time: 12/30/20  3:31 PM   Specimen: BLOOD  Result Value Ref Range Status   Specimen Description BLOOD BLOOD LEFT FOREARM  Final   Special Requests   Final    BOTTLES DRAWN AEROBIC AND ANAEROBIC Blood Culture adequate volume    Culture   Final    NO GROWTH 2 DAYS Performed at [facility], [place] [address], [place], [place] [address]    Report Status PENDING  Incomplete  Aerobic/Anaerobic Culture w Gram Stain (surgical/deep wound)     Status: None (  Preliminary result)   Collection Time: 12/31/20  3:33 PM   Specimen: PATH Cytology Misc. fluid; Synovial Fluid  Result Value Ref Range Status   Specimen Description SYNOVIAL  Final   Special Requests LEFT ELBOW  Final   Gram Stain   Final    FEW SQUAMOUS EPITHELIAL CELLS PRESENT MODERATE WBC SEEN NO ORGANISMS SEEN    Culture   Final    NO GROWTH < 24 HOURS Performed at [facility], [place] [address] [place], [place] [address]    Report Status PENDING  Incomplete    RADIOLOGY STUDIES/RESULTS: DG FLUORO GUIDED NEEDLE PLC ASPIRATION/INJECTION LOC  Result Date: 12/31/2020 CLINICAL DATA:  Elbow pain and swelling. Elbow joint effusion on CT. EXAM: LEFT ELBOW ARTHROCENTESIS UNDER FLUOROSCOPY COMPARISON:  CT elbow 12/30/2020 FLUOROSCOPY TIME:  Fluoroscopy Time:  0 minutes, 48 seconds Radiation Exposure Index (if provided by the fluoroscopic device): 1.2 mGy Number of Acquired Spot Images: 0 PROCEDURE: I discussed the risks (including hemorrhage and infection), benefits, and alternatives to the procedure with the patient's daughter, Dr. Silas [place], given that the patient is unable to consent for himself due to altered mental status. We specifically discussed the high technical likelihood of success of the procedure. Dr. [name] understood and elected for her father to undergo the procedure. Standard time-out was employed. Following sterile skin prep and local anesthetic administration consisting of 1% lidocaine, a 21 gauge 1.5 inch needle was advanced without difficulty into the anterior left radiocapitellar articulation under fluoroscopic guidance. A total of about 4 cc of serosanguineous fluid was aspirated from the joint. The needle was subsequently removed and  the skin cleansed and bandaged. No immediate complications were observed. IMPRESSION: 1. Technically successful fluoroscopically guided left elbow arthrocentesis yielding about 4 cc of serosanguineous fluid, sent for laboratory analysis. Electronically Signed   By: Van Clines M.D.   On: 12/31/2020 16:07   VAS [place] UPPER EXTREMITY VENOUS DUPLEX  Result Date: 12/30/2020 UPPER VENOUS STUDY  Patient Name:  Teyon Odette.  Date of Exam:   12/30/2020 Medical Rec #: [id number]               Accession #:    [id number] Date of Birth: [birth year]               Patient Gender: M Patient Age:   40 years Exam Location:  [facility] Procedure:      VAS [place] UPPER EXTREMITY VENOUS DUPLEX Referring Phys: Oren Binet --------------------------------------------------------------------------------  Indications: Swelling Limitations: Body habitus, poor ultrasound/tissue interface and patient position, restricted mobility. Comparison Study: No prior study Performing Technologist: Maudry Mayhew MHA, RDMS, RVT, RDCS  Examination Guidelines: A complete evaluation includes B-mode imaging, spectral Doppler, color Doppler, and power Doppler as needed of all accessible portions of each vessel. Bilateral testing is considered an integral part of a complete examination. Limited examinations for reoccurring indications may be performed as noted.  Right Findings: +----------+------------+---------+-----------+----------+--------------+ RIGHT     CompressiblePhasicitySpontaneousProperties   Summary     +----------+------------+---------+-----------+----------+--------------+ Subclavian                                          Not visualized +----------+------------+---------+-----------+----------+--------------+  Left Findings: +----------+------------+---------+-----------+----------+-------+ LEFT      CompressiblePhasicitySpontaneousPropertiesSummary  +----------+------------+---------+-----------+----------+-------+ IJV           Full       Yes  Yes                      +----------+------------+---------+-----------+----------+-------+ Subclavian    Full       Yes       Yes                      +----------+------------+---------+-----------+----------+-------+ Axillary      Full       Yes       Yes                      +----------+------------+---------+-----------+----------+-------+ Brachial      Full       Yes       Yes                      +----------+------------+---------+-----------+----------+-------+ Radial        Full                                          +----------+------------+---------+-----------+----------+-------+ Ulnar         Full                                          +----------+------------+---------+-----------+----------+-------+ Cephalic    Partial                                 Chronic +----------+------------+---------+-----------+----------+-------+ Basilic       Full                                          +----------+------------+---------+-----------+----------+-------+  Summary:  Left: No evidence of deep vein thrombosis in the upper extremity. 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  *See table(s) above for measurements and observations.  Diagnosing physician: Jamelle Haring Electronically signed by Jamelle Haring on 12/30/2020 at 6:48:16 PM.    Final    ECHOCARDIOGRAM LIMITED  Result Date: 12/31/2020    ECHOCARDIOGRAM LIMITED REPORT   Patient Name:   Sadarius Norman. Date of Exam: 12/31/2020 Medical Rec #:  [id number]              Height:       68.0 in Accession #:    [id number]             Weight:       202.2 lb Date of Birth:  [birth year]              BSA:          2.053 m Patient Age:    69 years               BP:           140/79 mmHg Patient Gender: M                      HR:           89 bpm. Exam  Location:  Inpatient Procedure: Limited Echo, Limited Color Doppler and Cardiac Doppler Indications:    bacteremia  History:  Patient has prior history of Echocardiogram examinations, most                 recent 12/24/2020. Defibrillator and Pacemaker, chronic kidney                 disease, Signs/Symptoms:Fever; Risk Factors:Hypertension,                 Dyslipidemia and Sleep Apnea.  Sonographer:    Johny Chess RDCS Referring Phys: [place]  Sonographer Comments: No apical window, Technically difficult study due to poor echo windows and Technically challenging study due to limited acoustic windows. Image acquisition challenging due to uncooperative patient and Image acquisition challenging due to respiratory motion. IMPRESSIONS  1. Left ventricular ejection fraction, by estimation, is 55 to 60%. The left ventricle has normal function.  2. The mitral valve is normal in structure. No evidence of mitral valve regurgitation. No evidence of mitral stenosis.  3. There is mild calcification of the aortic valve. There is mild thickening of the aortic valve.  4. The inferior vena cava is normal in size with greater than 50% respiratory variability, suggesting right atrial pressure of 3 mmHg. Comparison(s): Prior images reviewed side by side. Conclusion(s)/Recommendation(s): Limited study. Difficult imaging windows. There are no apparent vegetations or severe valve stenosis/regurgitation, and no vegetation seen on pacemaker lead. However, given images, would consider TEE for definitive evaluation if clinically indicated. FINDINGS  Left Ventricle: Left ventricular ejection fraction, by estimation, is 55 to 60%. The left ventricle has normal function. Mitral Valve: The mitral valve is normal in structure. No evidence of mitral valve stenosis. Tricuspid Valve: The tricuspid valve is normal in structure. Tricuspid valve regurgitation is trivial. No evidence of tricuspid stenosis. Aortic Valve: There is  mild calcification of the aortic valve. There is mild thickening of the aortic valve. Pulmonic Valve: The pulmonic valve was not well visualized. Venous: The inferior vena cava is normal in size with greater than 50% respiratory variability, suggesting right atrial pressure of 3 mmHg. Additional Comments: A device lead is visualized. LEFT VENTRICLE PLAX 2D LVIDd:         4.40 cm LVIDs:         2.70 cm LV PW:         1.30 cm LV IVS:        1.10 cm LVOT diam:     2.50 cm LVOT Area:     4.91 cm  IVC IVC diam: 1.20 cm LEFT ATRIUM         Index LA diam:    3.70 cm 1.80 cm/m   AORTA Ao Root diam: 3.50 cm  SHUNTS Systemic Diam: 2.50 cm Buford Dresser MD Electronically signed by Buford Dresser MD Signature Date/Time: 12/31/2020/5:56:56 PM    Final      LOS: 14 days   Oren Binet, MD  [facility]    To contact the attending provider between 7A-7P or the covering provider during after hours 7P-7A, please log into the web site [email or web address] and access using universal [place] password for that web site. If you do not have the password, please call the hospital operator.  01/01/2021, 2:03 PM

## 2021-01-01 NOTE — Progress Notes (Signed)
Neurology Progress Note   S:// Seen and examined this morning. More awake. Was able to tell me his name, tell me that he is feeling all right but had poor attention concentration to participate with full exam.   O:// Current vital signs: BP (!) 142/88 (BP Location: Left Arm)   Pulse 94   Temp 98.9 F (37.2 C) (Oral)   Resp (!) 24   Ht 5\' 8"  (1.727 m)   Wt 91.7 kg   SpO2 92%   BMI 30.74 kg/m  Vital signs in last 24 hours: Temp:  [98.8 F (37.1 C)-100.5 F (38.1 C)] 98.9 F (37.2 C) (09/24 0754) Pulse Rate:  [86-96] 94 (09/24 0754) Resp:  [16-24] 24 (09/24 0754) BP: (123-142)/(74-88) 142/88 (09/24 0754) SpO2:  [92 %-96 %] 92 % (09/24 0754) Weight:  [91.7 kg] 91.7 kg (09/23 2051) General: Remains sleepy but awakens to voice. HEENT: Normocephalic/atraumatic Lungs: Bilateral rales CVS: Regular rate rhythm Neurological exam Drowsy but able to answer simple questions such as his name or if he is in discomfort Speech is dysarthric Attention concentration remains poor Cranial nerves: Pupils equal round react light, extraocular movements intact, blinks to threat from both sides, face grossly symmetric Motor examination with right-sided plegia.  Appears 4+/5 strength in left upper extremity, inconsistently antigravity in the left lower extremity as well. Sensation intact to touch    Medications  Current Facility-Administered Medications:    0.9 %  sodium chloride infusion, , Intravenous, Continuous, Ghimire, Henreitta Leber, MD, Stopped at 12/28/20 [phone]   acetaminophen (TYLENOL) tablet 650 mg, 650 mg, Per Tube, Q6H PRN, 650 mg at 12/29/20 2327 **OR** acetaminophen (TYLENOL) suppository 650 mg, 650 mg, Rectal, Q6H PRN, Ghimire, Henreitta Leber, MD   albuterol (PROVENTIL) (2.5 MG/3ML) 0.083% nebulizer solution 2.5 mg, 2.5 mg, Nebulization, Q6H PRN, Regalado, Belkys A, MD, 2.5 mg at 12/21/20 1710   allopurinol (ZYLOPRIM) tablet 200 mg, 200 mg, Per Tube, Daily, Ghimire, Henreitta Leber, MD, 200 mg  at 01/01/21 1884   aspirin chewable tablet 81 mg, 81 mg, Per Tube, Daily, Ghimire, Henreitta Leber, MD, 81 mg at 01/01/21 1660   Chlorhexidine Gluconate Cloth 2 % PADS 6 each, 6 each, Topical, Daily, Ghimire, Shanker M, MD, 6 each at 01/01/21 0953   colchicine tablet 0.6 mg, 0.6 mg, Per Tube, Daily PRN, Ghimire, Henreitta Leber, MD   enoxaparin (LOVENOX) injection 40 mg, 40 mg, Subcutaneous, Q24H, Paytes, Austin A, RPH   feeding supplement (OSMOLITE 1.5 CAL) liquid 1,000 mL, 1,000 mL, Per Tube, Continuous, Ghimire, Henreitta Leber, MD, Last Rate: 60 mL/hr at 12/31/20 0640, 1,000 mL at 12/31/20 0640   feeding supplement (PROSource TF) liquid 45 mL, 45 mL, Per Tube, BID, Ghimire, Shanker M, MD, 45 mL at 01/01/21 0953   free water 300 mL, 300 mL, Per Tube, Q6H, Ghimire, Shanker M, MD, 300 mL at 01/01/21 0506   insulin aspart (novoLOG) injection 0-15 Units, 0-15 Units, Subcutaneous, Q4H, Crosley, Debby, MD, 3 Units at 01/01/21 0951   insulin aspart (novoLOG) injection 0-5 Units, 0-5 Units, Subcutaneous, QHS, Crosley, Debby, MD   insulin detemir (LEVEMIR) injection 10 Units, 10 Units, Subcutaneous, BID, Ghimire, Shanker M, MD, 10 Units at 01/01/21 0950   lacosamide (VIMPAT) 150 mg in sodium chloride 0.9 % 25 mL IVPB, 150 mg, Intravenous, Q12H, Yadav, Priyanka O, MD, Last Rate: 80 mL/hr at 01/01/21 1055, 150 mg at 01/01/21 1055   levothyroxine (SYNTHROID) tablet 137 mcg, 137 mcg, Per Tube, Q0600, Jonetta Osgood, MD, 137 mcg at 01/01/21  0503   lidocaine (PF) (XYLOCAINE) 1 % injection 5 mL, 5 mL, Other, Once, Lora Havens, MD   metoprolol tartrate (LOPRESSOR) 25 mg/10 mL oral suspension 37.5 mg, 37.5 mg, Per Tube, BID, Ghimire, Henreitta Leber, MD, 37.5 mg at 01/01/21 0952   midazolam (VERSED) injection 2 mg, 2 mg, Intravenous, Q4H PRN, Lora Havens, MD   ondansetron (ZOFRAN) tablet 4 mg, 4 mg, Per Tube, Q6H PRN **OR** ondansetron (ZOFRAN) injection 4 mg, 4 mg, Intravenous, Q6H PRN, Ghimire, Henreitta Leber, MD    tamsulosin (FLOMAX) capsule 0.4 mg, 0.4 mg, Oral, Daily, Ghimire, Shanker M, MD, 0.4 mg at 01/01/21 2297   valproic acid (DEPAKENE) 250 MG/5ML solution 500 mg, 500 mg, Per Tube, BID, 500 mg at 01/01/21 0953 **AND** valproic acid (DEPAKENE) 250 MG/5ML solution 250 mg, 250 mg, Per Tube, Q24H, Yadav, Priyanka O, MD, 250 mg at 12/31/20 1804   vancomycin (VANCOCIN) IVPB 1000 mg/200 mL premix, 1,000 mg, Intravenous, Q48H, Willette Cluster, RPH, Last Rate: 200 mL/hr at 12/30/20 1406, 1,000 mg at 12/30/20 1406  Facility-Administered Medications Ordered in Other Encounters:    0.9 %  sodium chloride infusion, 1,000 mL, Intravenous, Once, [name], MD Labs CBC    Component Value Date/Time   WBC 8.8 01/01/2021 0605   RBC 4.43 01/01/2021 0605   HGB 13.7 01/01/2021 0605   HGB 14.4 02/27/2020 1242   HGB 15.8 03/26/2017 0928   HCT 42.2 01/01/2021 0605   HCT 42.9 02/27/2020 1242   HCT 46.9 03/26/2017 0928   PLT 213 01/01/2021 0605   PLT 192 02/27/2020 1242   MCV 95.3 01/01/2021 0605   MCV 93 02/27/2020 1242   MCV 92.6 03/26/2017 0928   MCH 30.9 01/01/2021 0605   MCHC 32.5 01/01/2021 0605   RDW 13.8 01/01/2021 0605   RDW 14.2 02/27/2020 1242   RDW 14.2 03/26/2017 0928   LYMPHSABS 0.7 12/17/2020 2048   LYMPHSABS 0.6 (L) 03/26/2017 0928   MONOABS 0.5 12/17/2020 2048   MONOABS 0.6 03/26/2017 0928   EOSABS 0.3 12/17/2020 2048   EOSABS 0.3 03/26/2017 0928   BASOSABS 0.0 12/17/2020 2048   BASOSABS 0.0 03/26/2017 0928    CMP     Component Value Date/Time   NA 141 01/01/2021 0605   NA 140 02/27/2020 1242   NA 140 03/26/2017 0928   K 4.8 01/01/2021 0605   K 4.7 03/26/2017 0928   CL 106 01/01/2021 0605   CO2 27 01/01/2021 0605   CO2 21 (L) 03/26/2017 0928   GLUCOSE 158 (H) 01/01/2021 0605   GLUCOSE 95 03/26/2017 0928   BUN 54 (H) 01/01/2021 0605   BUN 33 (H) 02/27/2020 1242   BUN 19.3 03/26/2017 0928   CREATININE 1.88 (H) 01/01/2021 0605   CREATININE 1.77 (H) 05/24/2018 1445    CREATININE 1.2 03/26/2017 0928   CALCIUM 8.4 (L) 01/01/2021 0605   CALCIUM 8.7 03/26/2017 0928   PROT 5.6 (L) 01/01/2021 0605   PROT 6.8 03/26/2017 0928   ALBUMIN 2.0 (L) 01/01/2021 0605   ALBUMIN 3.7 03/26/2017 0928   AST 28 01/01/2021 0605   AST 18 05/24/2018 1445   AST 22 03/26/2017 0928   ALT 20 01/01/2021 0605   ALT 13 05/24/2018 1445   ALT 15 03/26/2017 0928   ALKPHOS 44 01/01/2021 0605   ALKPHOS 58 03/26/2017 0928   BILITOT 0.6 01/01/2021 0605   BILITOT 0.6 05/24/2018 1445   BILITOT 0.58 03/26/2017 0928   GFRNONAA 35 (L) 01/01/2021 0605   GFRNONAA 36 (  L) 05/24/2018 1445   GFRAA 46 (L) 02/27/2020 1242   GFRAA 42 (L) 05/24/2018 1445     Imaging I have reviewed images in epic and the results pertinent to this consultation are: Most recent head CT-12/26/2020 with no evidence of acute intracranial abnormality, the low-density seen on the prior CT is no longer evident and was likely artifactual.  Extensive encephalomalacia in the left frontal lobe with exvacuodilatation of the left lateral ventricle and chronic white matter disease.   CSF studies performed 12/24/2020 with mild elevated glucose, normal protein, normal cell count with no malignant cells seen on cytology.  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  EEG showed seizures emanating from the left central temporal cortex which had been difficult to control and required multiple antiepileptics. He was on multiple sedating medications-most notably Onfi, benzo with extremely long half-life. 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  Likely small stroke versus worsening effects from the encephalomalacia in the setting of acute illness. Had a detailed conversation with his daughter and son at the bedside and answered their questions. 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  Family will have talks between themselves as well as with palliative medicine to ascertain further course. I discussed the utility of CT head with contrast and trying to see if there is any evidence of abnormal enhancement or meningeal involvement-the family is very reluctant for any further imaging. Also broached the subject of possible repeat LP but I am not sure if that would be helpful as the LP done during this admission had negative cytology for malignant cells.  Discussed in detail with son and daughter at bedside and with Dr. Sloan Leiter -- Amie Portland, MD Neurologist [facility] Pager: [phone]

## 2021-01-01 NOTE — Progress Notes (Signed)
Id brief note  Low grade temp Encephalopathy improving  9/13 and 9/21 mrse profiles are different   A/p Suspect fever ongoing aspiration; pneumonitis vs pna Mrse bacteremia x2 different species, low burden each episode, unlikely pathogenic   -will do 3 more days amox-clav for possible asp pna -stop vanc/cefepime -will sign off, please call if further question -discussed with primary team

## 2021-01-01 NOTE — Plan of Care (Signed)

## 2021-01-02 LAB — CBC
HCT: 48.4 % (ref 39.0–52.0)
Hemoglobin: 15.8 g/dL (ref 13.0–17.0)
MCH: 31 pg (ref 26.0–34.0)
MCHC: 32.6 g/dL (ref 30.0–36.0)
MCV: 95.1 fL (ref 80.0–100.0)
Platelets: 234 10*3/uL (ref 150–400)
RBC: 5.09 MIL/uL (ref 4.22–5.81)
RDW: 13.8 % (ref 11.5–15.5)
WBC: 10.9 10*3/uL — ABNORMAL HIGH (ref 4.0–10.5)
nRBC: 0 % (ref 0.0–0.2)

## 2021-01-02 LAB — COMPREHENSIVE METABOLIC PANEL
ALT: 17 U/L (ref 0–44)
AST: 36 U/L (ref 15–41)
Albumin: 2.1 g/dL — ABNORMAL LOW (ref 3.5–5.0)
Alkaline Phosphatase: 45 U/L (ref 38–126)
Anion gap: 13 (ref 5–15)
BUN: 52 mg/dL — ABNORMAL HIGH (ref 8–23)
CO2: 22 mmol/L (ref 22–32)
Calcium: 8.8 mg/dL — ABNORMAL LOW (ref 8.9–10.3)
Chloride: 106 mmol/L (ref 98–111)
Creatinine, Ser: 1.88 mg/dL — ABNORMAL HIGH (ref 0.61–1.24)
GFR, Estimated: 35 mL/min — ABNORMAL LOW (ref >60)
Glucose, Bld: 93 mg/dL (ref 70–99)
Potassium: 5.6 mmol/L — ABNORMAL HIGH (ref 3.5–5.1)
Sodium: 141 mmol/L (ref 135–145)
Total Bilirubin: UNDETERMINED mg/dL (ref 0.3–1.2)
Total Protein: 6 g/dL — ABNORMAL LOW (ref 6.5–8.1)

## 2021-01-02 LAB — GLUCOSE, CAPILLARY
Glucose-Capillary: 101 mg/dL — ABNORMAL HIGH (ref 70–99)
Glucose-Capillary: 106 mg/dL — ABNORMAL HIGH (ref 70–99)
Glucose-Capillary: 132 mg/dL — ABNORMAL HIGH (ref 70–99)

## 2021-01-02 MED ORDER — SODIUM CHLORIDE 0.9% FLUSH: 10.0000 mL | INTRAVENOUS | Status: DC | PRN

## 2021-01-02 MED ORDER — POLYVINYL ALCOHOL 1.4 % OP SOLN
1.0000 [drp] | OPHTHALMIC | Status: DC | PRN
  Administered 2021-01-03 – 2021-01-04 (×3): 1 [drp] via OPHTHALMIC
  Filled 2021-01-02: qty 15

## 2021-01-02 MED ORDER — HYDROMORPHONE HCL 1 MG/ML IJ SOLN
0.2500 mg | INTRAMUSCULAR | Status: DC | PRN
  Administered 2021-01-03 (×2): 0.25 mg via INTRAVENOUS
  Filled 2021-01-02 (×2): qty 0.5

## 2021-01-02 MED ORDER — DIAZEPAM 5 MG/ML IJ SOLN: 5.0000 mg | INTRAMUSCULAR | Status: DC | PRN

## 2021-01-02 MED ORDER — MORPHINE SULFATE (CONCENTRATE) 10 MG/0.5ML PO SOLN
20.0000 mg | ORAL | Status: AC
  Administered 2021-01-02: 20 mg via SUBLINGUAL
  Filled 2021-01-02: qty 1

## 2021-01-02 MED ORDER — GLYCOPYRROLATE 0.2 MG/ML IJ SOLN: 0.1000 mg | INTRAMUSCULAR | Status: DC | PRN

## 2021-01-02 MED ORDER — SODIUM ZIRCONIUM CYCLOSILICATE 10 G PO PACK
10.0000 g | PACK | Freq: Three times a day (TID) | ORAL | Status: DC
  Administered 2021-01-02: 10 g
  Filled 2021-01-02: qty 1

## 2021-01-02 MED ORDER — SODIUM CHLORIDE 0.9% FLUSH
10.0000 mL | Freq: Two times a day (BID) | INTRAVENOUS | Status: DC
  Administered 2021-01-02: 20 mL
  Administered 2021-01-03 (×2): 10 mL

## 2021-01-02 NOTE — Plan of Care (Signed)
Patient briefly seen and examined this morning. He is moving his left side spontaneously Is awake but not following commands consistently. Is plegic on the right side I have no new recommendations at this time. His encephalopathy is not related to seizures at this time-his EEGs have been negative for multiple days before discontinuation of the long-term EEG. Current condition including mentation is likely secondary to toxic metabolic derangement and low brain reserve in the setting of prior brain metastases and strokes as well as postradiation necrosis. I would continue to focus on palliative conversations regarding the best way to keep him comfortable and to treat the toxic metabolic derangements as you are. At the request of the daughter, the [place] level was sent, to see if the long-acting benzo being in his system was contributing to the altered mental status-as of this morning, the test results are still pending.  I would defer the primary team to follow. Neurology will be available as needed. Please call with questions Discussed over phone with Dr. Candiss Norse   -- Amie Portland, MD Neurologist [facility] Pager: [phone]

## 2021-01-02 NOTE — Plan of Care (Signed)
  Problem: Education: Goal: Knowledge of General Education information will improve Description: Including pain rating scale, medication(s)/side effects and non-pharmacologic comfort measures 01/02/2021 1543 by Drucie Ip I, RN Outcome: Not Progressing 01/02/2021 1542 by Drucie Ip I, RN Outcome: Progressing   Problem: Health Behavior/Discharge Planning: Goal: Ability to manage health-related needs will improve 01/02/2021 1543 by Drucie Ip I, RN Outcome: Not Progressing 01/02/2021 1542 by Drucie Ip I, RN Outcome: Progressing   Problem: Clinical Measurements: Goal: Ability to maintain clinical measurements within normal limits will improve 01/02/2021 1543 by Drucie Ip I, RN Outcome: Not Progressing 01/02/2021 1542 by Drucie Ip I, RN Outcome: Progressing Goal: Will remain free from infection 01/02/2021 1543 by Drucie Ip I, RN Outcome: Not Progressing 01/02/2021 1542 by Drucie Ip I, RN Outcome: Progressing Goal: Diagnostic test results will improve 01/02/2021 1543 by Drucie Ip I, RN Outcome: Not Progressing 01/02/2021 1542 by Drucie Ip I, RN Outcome: Progressing Goal: Respiratory complications will improve 01/02/2021 1543 by Drucie Ip I, RN Outcome: Not Progressing 01/02/2021 1542 by Drucie Ip I, RN Outcome: Progressing Goal: Cardiovascular complication will be avoided 01/02/2021 1543 by Drucie Ip I, RN Outcome: Not Progressing 01/02/2021 1542 by Drucie Ip I, RN Outcome: Progressing   Problem: Activity: Goal: Risk for activity intolerance will decrease 01/02/2021 1543 by Drucie Ip I, RN Outcome: Not Progressing 01/02/2021 1542 by Drucie Ip I, RN Outcome: Progressing   Problem: Nutrition: Goal: Adequate nutrition will be maintained 01/02/2021 1543 by Drucie Ip I, RN Outcome: Not Progressing 01/02/2021 1542 by Drucie Ip I, RN Outcome:  Progressing   Problem: Coping: Goal: Level of anxiety will decrease 01/02/2021 1543 by Drucie Ip I, RN Outcome: Not Progressing 01/02/2021 1542 by Drucie Ip I, RN Outcome: Progressing   Problem: Elimination: Goal: Will not experience complications related to bowel motility 01/02/2021 1543 by Drucie Ip I, RN Outcome: Not Progressing 01/02/2021 1542 by Drucie Ip I, RN Outcome: Progressing Goal: Will not experience complications related to urinary retention 01/02/2021 1543 by Drucie Ip I, RN Outcome: Not Progressing 01/02/2021 1542 by Drucie Ip I, RN Outcome: Progressing   Problem: Pain Managment: Goal: General experience of comfort will improve 01/02/2021 1543 by Drucie Ip I, RN Outcome: Not Progressing 01/02/2021 1542 by Drucie Ip I, RN Outcome: Progressing   Problem: Safety: Goal: Ability to remain free from injury will improve 01/02/2021 1543 by Drucie Ip I, RN Outcome: Not Progressing 01/02/2021 1542 by Drucie Ip I, RN Outcome: Progressing   Problem: Skin Integrity: Goal: Risk for impaired skin integrity will decrease 01/02/2021 1543 by Drucie Ip I, RN Outcome: Not Progressing 01/02/2021 1542 by Drucie Ip I, RN Outcome: Progressing

## 2021-01-02 NOTE — Progress Notes (Addendum)
PROGRESS NOTE        PATIENT DETAILS Name: David [name]. Age: 80 y.o. Sex: male Date of Birth: 1940/09/14 Admit Date: 12/22/2020 Admitting Physician No admitting provider for patient encounter. GYJ:[email or web address], [name], MD  Brief Narrative: Patient is a 80 y.o. male with history of metastatic melanoma with bone/lung/brain mets-s/p brain radiation/left frontal craniotomy and tumor excision (July 2017), acute promyelocytic leukemia, hemorrhagic CVA, HTN, CAD s/p CABG 2014, AV block-s/p PPM placement, OSA on CPAP, depression who presented to [facility] with aphasia-found to have EEG evidence of seizures originating from the left temporal region-subsequently transferred to [facility] for LTM EEG.  Patient was felt to have intractable seizures-and was started on multiple antiepileptics (Onfi, Depakote, Keppra, Vimpat) subsequent hospital course complicated by encephalopathy (felt to be due to Onfi/seizure meds/AKI ), fever (likely aspiration pneumonitis) .  Subjective: More awake and alert-answering simple questions appropriately today.  Still groggy/drowsy but markedly better than the past 2 3 days.  He does appear to have significant right-sided hemiplegia today.  Objective: Vitals: Blood pressure (!) 124/91, pulse 91, temperature 98.5 F (36.9 C), temperature source Oral, resp. rate 18, height 5\' 8"  (1.727 m), weight 91.7 kg, SpO2 100 %.   Exam: Gen Exam: Still lethargic but more awake and alert compared to past few days. Chest: B/L clear to auscultation anteriorly CVS:S1S2 regular Abdomen:soft non tender, non distended Extremities:no edema Neurology: Right-sided hemiplegia-difficult exam given mental status but right side does appear weak. Skin: no rash   Pertinent labs/radiology: Na: 141 Creatinine: 1.88 WBC: 8.8 Plt: 213  Uric acid: 4.4  Lumbar puncture: 9/16>>CSF: WBC 3, protein 27 9/16>> CSF culture: No growth 9/16>>CSF HSV/Crypto Ag:neg  Stroke  work-up: 9/16>> CT head: Right cerebellar CVA 9/16>> CTA head and neck: No LVO 9/16>> Echo: EF 55-60% 9/16>> LDL: 42 9/16>> A1c: 5.8 9/18: CT head: No acute abnormality-lesion seen on CT head on 9/16 in right cerebellar peduncle was likely artifactual. 9/23>> Limited echo: No evidence of severe valve stenosis/regurgitation or vegetation seen on pacemaker lead.  Radiology: 9/21>> CT abdomen/pelvis: No acute abnormalities-mild infiltration in lung bases right> left 9/22>> CT left elbow: Small joint effusion. 9/22>> B/L lower extremity Doppler: No DVT 9/22>> left upper extremity Doppler: Chronic superficial vein thrombosis involving the left cephalic vein.  Microbiology: 9/14>>Blood culture: 1/2-staph epidermidis 9/16>> blood culture: No growth 9/21>> blood culture: 1/2 staph epidermidis. 9/21>> urine culture: No growth 9/21>> COVID PCR: Negative 9/21: Urine culture: No growth 9/22>> blood culture: No growth 9/23>> left elbow synovial culture: No growth  Pathology: 9/16>>CSF cytology: No malignant cells identified.   Subjective - patient in bed appears to be in mild respiratory distress, opens eyes to verbal commands and sees no to any pain, appears lethargic, unable to answer reliably or follow commands reliably.  Assessment/Plan:  New refractory focal seizures: No further seizures on LTM EEG (last EEG seizure on 9/15).remains on Vimpat and Depakote.  Onfi/Keppra discontinued given severe encephalopathy.  LTM EEG discontinued on 9/21.  Neurology following, case discussed with neurologist Dr. Malen Gauze on 01/02/2021, poor prognosis, continue current seizure medications, supportive care.    Acute toxic metabolic encephalopathy: Thought to be due to seizure medications (particularly Onfi which is a long half-life) in the setting of AKI/hypernatremia (significantly improved).  Mental status has definitely improved over the past few days-although lethargic he is much more awake and alert  answering  a few questions.  Encephalopathy likely due to combination of underlying encephalomalacia from previous metastatic melanoma brain surgery, prolonged seizures and Onfi being on board.  Although encephalopathy is mildly improved long-term prognosis remains extremely guarded.  Right-sided hemiplegia with underlying history of severe left frontal lobe encephalomalacia due to metastatic melanoma surgery along with nontraumatic intracranial hemorrhage in the past: Patient has had a long hospital stay with prolonged encephalopathy-on 9/16-CT head showed a possible right cerebellar stroke-however repeat CT on 9/18 did not show stroke-after discussion with neurology it was felt that CT findings on 9/16 was an artifact.  Since his encephalopathy has markedly improved-and he is more awake today than the past few days-he appears to have a significant right-sided hemiplegia.  Long discussion with Dr. Rory Percy today-suspicion is for worsening encephalomalacia in the setting of focal status epilepticus likely the culprit for right-sided hemiplegia or he has had a small CVA not seen on imaging.  Unable to do a MRI brain given that he has a PPM in place.  It is felt that obtaining further neuroimaging with no change management outcome.  Also had a long discussion with patient's daughter/son at bedside-patient would not want to live with a PEG tube-now would he want to live with significant amount of right-sided hemiplegia.  Family discussion in progress-Dr. Hilma Favors from palliative care engaging family-for now family has decided to continue home medications via NG tube but stop the feeding as of night of 01/01/2021.  Continue supportive care.  Long-term prognosis appears poor.  ?Acute right cerebellar CVA: CT head on 9/16 showed possible right cerebellar stroke-however repeat CT head on 9/18 did not show stroke-it is now felt that lesion on the right cerebellar lobe on 9/16 was a artifact.  CVA ruled out.  Back on  aspirin-no longer on plavix.  PPM interrogation completed-no A. fib detected.  See above regarding finding of right-sided hemiplegia-noted encephalopathy has started to improve.  Recurrent fever ( on 9/13-and then on 9/20): Extensive work-up completed-see above-not felt to have PPM lead infection at this point-suspicion is for ongoing aspiration pneumonitis.  No evidence of septic arthritis in the left elbow-LUE swelling is likely due to superficial vein thrombosis seen on Doppler ultrasound.  B/L lower extremity Dopplers were negative for DVT.  ID has narrowed down antimicrobial therapy to Augmentin x3 days.    Coag negative staph bacteremia (1/2 blood culture on 9/14, and 1/2 on 9/21): Has PPM in place-concern that this may be a real infection-and pacemaker leads could have gotten seeded.  Limited echo on 9/23 without any evidence of valve or pacemaker lead vegetation.  Per ID-MRSE profiles on culture on 9/14 and 9/21 are different species--unlikely pathogenic.  Very low suspicion for pacemaker lead infection at this point-given his overall clinical state-he is too frail to tolerate sedation for a TEE.  AKI on CKD stage IIIb: AKI due to contrast nephropathy-improved-and back to baseline.  Avoid nephrotoxic agents.Per daughter-Dr Leggett-patient has thin basement membrane disease and has chronic microscopic hematuria  Acute urinary retention-on 9/18-failed a voiding trial-Foley catheter reinserted on 9/22: Continue Flomax-we will need to attempt a voiding trial again when he is a bit more awake alert and less debilitated.  Hypernatremia: Secondary to dehydration (no oral intake due to encephalopathy)-continue free water flushes via PEG tube.  Sodium levels have normalized.  Hyperkalemia.  Lokelma 3 doses via NG tube on 01/02/2021.    Hyperglycemia: Likely due to tube feedings-no evidence of diabetes.  CBGs stabilized-continue Levemir 10 units twice daily and  SSI.    Recent Labs    01/01/21 2356  01/02/21 0348 01/02/21 0740  GLUCAP 124* 101* 106*   HTN: BP relatively stable-on metoprolol-if blood pressure continues to be on the higher side-May need to resume amlodipine.  Reassess 9/21.  History of CAD s/p CABG in 2014: Remains stable-continue aspirin/beta-blocker.    History of complete heart block-PPM in place  History of hemiplegia and hemiparesis following nontraumatic intracranial hemorrhage affecting right dominant side  History of metastatic melanoma with metastasis to the lingula, right upper lung, supraventricular clavicular lymph node, left frontal lobe metastasis-s/p resection: Resume outpatient follow-up with oncology/neurooncology.    History of acute promyelocytic leukemia: Follow CBC periodically--in remission-stable for outpatient follow-up with primary oncologist.  Major depressive disorder: Trazodone/Zoloft held due to encephalopathy-resume over the next few days once mentation improves further.  Hypothyroidism: Continue levothyroxine.  Recent TSH in May was within normal limits.   OSA:on CPAP-on hold as NG tube is in place.  Nutrition: Cortak tube placed on 9/16-feedings ongoing.  Will need SLP reevaluation once encephalopathy has improved before discontinuing cortak.  Functional quadriplegia/severe debility/deconditioning: Normally able to ambulate with the help of walker-current significant debility is from acute illness.  Once encephalopathy resolves-hopeful that we can start PT/OT.  Palliative care: DNR in place.  Multiple discussions with patient's daughter Silas Sacramento over the past few days-again today-given the fact the patient now appears to have significant right-sided hemiplegia-we did have a frank discussion regarding perhaps initiating comfort measures as it was felt likely that patient will have poor quality of life going forward.  I reached out to Dr. Kara Dies care-she has had prior conversations with the family in the distant past.  Dr.  Hilma Favors will continue to have end-of-life discussion with family and provide [place] with further recommendations.  Obesity: Estimated body mass index is 30.74 kg/m as calculated from the following:   Height as of this encounter: 5\' 8"  (1.727 m).   Weight as of this encounter: 91.7 kg.    Procedures : 9/16>> lumbar puncture by IR 9/23>> left elbow arthrocentesis by IR  Consults:Neurology, palliative care DVT Prophylaxis :Prophylactic Lovenox Code Status:DNR Family Communication:Daughter-[name] [phone] bedside - 01/02/21  Disposition Plan: Status is: Inpatient  Remains inpatient appropriate because:Inpatient level of care appropriate due to severity of illness  Dispo: The patient is from: Home              Anticipated d/c is to: SNF              Patient currently is not medically stable to d/c.   Difficult to place patient No    Barriers to Discharge: Ongoing encephalopathy-not yet at baseline-not yet stable for discharge.  Antimicrobial agents: Anti-infectives (From admission, onward)    Start     Dose/Rate Route Frequency Ordered Stop   01/01/21 1345  amoxicillin-clavulanate (AUGMENTIN) 400-57 MG/5ML suspension 800 mg        800 mg Per Tube Every 12 hours 01/01/21 1257 01-24-21 0959   12/30/20 1730  ceFEPIme (MAXIPIME) 2 g in sodium chloride 0.9 % 100 mL IVPB  Status:  Discontinued        2 g 200 mL/hr over 30 Minutes Intravenous Every 24 hours 12/30/20 1632 01/01/21 1104   12/30/20 1400  vancomycin (VANCOCIN) IVPB 1000 mg/200 mL premix  Status:  Discontinued        1,000 mg 200 mL/hr over 60 Minutes Intravenous Every 48 hours 12/30/20 1312 01/01/21 1257   12/29/20 2100  vancomycin (  VANCOREADY) IVPB 1750 mg/350 mL        1,750 mg 175 mL/hr over 120 Minutes Intravenous  Once 12/29/20 2054 12/30/20 0017   12/29/20 1630  piperacillin-tazobactam (ZOSYN) IVPB 3.375 g  Status:  Discontinued        3.375 g 12.5 mL/hr over 240 Minutes Intravenous Every 8  hours 12/29/20 1524 12/30/20 1632   12/29/20 1630  vancomycin (VANCOREADY) IVPB 1750 mg/350 mL  Status:  Discontinued        1,750 mg 175 mL/hr over 120 Minutes Intravenous  Once 12/29/20 1524 12/29/20 2054   12/26/20 1600  vancomycin (VANCOREADY) IVPB 1250 mg/250 mL  Status:  Discontinued        1,250 mg 166.7 mL/hr over 90 Minutes Intravenous Every 48 hours 12/24/20 1252 12/25/20 1038   12/26/20 1600  vancomycin (VANCOCIN) IVPB 1000 mg/200 mL premix  Status:  Discontinued        1,000 mg 200 mL/hr over 60 Minutes Intravenous Every 48 hours 12/25/20 1038 12/25/20 1547   12/24/20 1800  Ampicillin-Sulbactam (UNASYN) 3 g in sodium chloride 0.9 % 100 mL IVPB        3 g 200 mL/hr over 30 Minutes Intravenous Every 12 hours 12/24/20 1309 12/25/20 1754   12/24/20 1345  vancomycin (VANCOREADY) IVPB 1750 mg/350 mL        1,750 mg 175 mL/hr over 120 Minutes Intravenous  Once 12/24/20 1251 12/24/20 1548   12/22/20 1300  ampicillin-sulbactam (UNASYN) 1.5 g in sodium chloride 0.9 % 100 mL IVPB  Status:  Discontinued        1.5 g 200 mL/hr over 30 Minutes Intravenous Every 8 hours 12/22/20 1202 12/24/20 1309   12/21/20 1900  azithromycin (ZITHROMAX) 500 mg in sodium chloride 0.9 % 250 mL IVPB  Status:  Discontinued        500 mg 250 mL/hr over 60 Minutes Intravenous Every 24 hours 12/21/20 1805 12/22/20 1058   12/21/20 1730  cefTRIAXone (ROCEPHIN) 1 g in sodium chloride 0.9 % 100 mL IVPB  Status:  Discontinued        1 g 200 mL/hr over 30 Minutes Intravenous Every 24 hours 12/21/20 1633 12/22/20 1058   12/21/20 1730  azithromycin (ZITHROMAX) tablet 500 mg  Status:  Discontinued        500 mg Oral Daily 12/21/20 1633 12/21/20 1804        Time spent: 35 minutes-Greater than 50% of this time was spent in counseling, explanation of diagnosis, planning of further management, and coordination of care.  MEDICATIONS: Scheduled Meds:  allopurinol  200 mg Per Tube Daily   amoxicillin-clavulanate  800  mg Per Tube Q12H   aspirin  81 mg Per Tube Daily   Chlorhexidine Gluconate Cloth  6 each Topical Daily   enoxaparin (LOVENOX) injection  40 mg Subcutaneous Q24H   insulin aspart  0-15 Units Subcutaneous Q4H   insulin aspart  0-5 Units Subcutaneous QHS   levothyroxine  137 mcg Per Tube Q0600   metoprolol tartrate  37.5 mg Per Tube BID   sodium zirconium cyclosilicate  10 g Per Tube TID   tamsulosin  0.4 mg Oral Daily   valproic acid  500 mg Per Tube BID   And   valproic acid  250 mg Per Tube Q24H   Continuous Infusions:  sodium chloride 10 mL/hr at 01/01/21 2330   lacosamide (VIMPAT) IV 150 mg (01/01/21 2331)   PRN Meds:.acetaminophen **OR** acetaminophen, albuterol, colchicine, midazolam, ondansetron **OR** ondansetron (ZOFRAN) IV  LABORATORY DATA:  Recent Labs  Lab 12/29/20 0331 12/30/20 0305 12/31/20 0351 01/01/21 0605 01/02/21 0459  WBC 8.6 8.9 8.6 8.8 10.9*  HGB 15.0 13.8 13.8 13.7 15.8  HCT 45.4 42.7 41.1 42.2 48.4  PLT 164 151 178 213 234  MCV 95.6 96.8 95.4 95.3 95.1  MCH 31.6 31.3 32.0 30.9 31.0  MCHC 33.0 32.3 33.6 32.5 32.6  RDW 13.9 13.9 13.8 13.8 13.8    Recent Labs  Lab 12/29/20 0331 12/29/20 0723 12/30/20 0305 12/31/20 0351 01/01/21 0605 01/02/21 0459  NA 145  --  138 136 141 141  K 4.5  --  4.4 4.7 4.8 5.6*  CL 110  --  104 103 106 106  CO2 26  --  24 25 27 22   GLUCOSE 191*  --  230* 218* 158* 93  BUN 50*  --  53* 55* 54* 52*  CREATININE 1.83*  --  2.20* 2.18* 1.88* 1.88*  CALCIUM 8.3*  --  7.9* 7.7* 8.4* 8.8*  AST 20  --  19 23 28  36  ALT 14  --  14 16 20 17   ALKPHOS 40  --  41 42 44 45  BILITOT 0.3  --  0.9 0.9 0.6 QUANTITY NOT SUFFICIENT, UNABLE TO PERFORM TEST  ALBUMIN 2.2*  --  1.9* 1.9* 2.0* 2.1*  PROCALCITON  --  0.13  --   --   --   --   AMMONIA 45*  --  25 36*  --   --            RADIOLOGY STUDIES/RESULTS: DG FLUORO GUIDED NEEDLE PLC ASPIRATION/INJECTION LOC  Result Date: 12/31/2020 CLINICAL DATA:  Elbow pain and  swelling. Elbow joint effusion on CT. EXAM: LEFT ELBOW ARTHROCENTESIS UNDER FLUOROSCOPY COMPARISON:  CT elbow 12/30/2020 FLUOROSCOPY TIME:  Fluoroscopy Time:  0 minutes, 48 seconds Radiation Exposure Index (if provided by the fluoroscopic device): 1.2 mGy Number of Acquired Spot Images: 0 PROCEDURE: I discussed the risks (including hemorrhage and infection), benefits, and alternatives to the procedure with the patient's daughter, Dr. Silas [place], given that the patient is unable to consent for himself due to altered mental status. We specifically discussed the high technical likelihood of success of the procedure. Dr. [name] understood and elected for her father to undergo the procedure. Standard time-out was employed. Following sterile skin prep and local anesthetic administration consisting of 1% lidocaine, a 21 gauge 1.5 inch needle was advanced without difficulty into the anterior left radiocapitellar articulation under fluoroscopic guidance. A total of about 4 cc of serosanguineous fluid was aspirated from the joint. The needle was subsequently removed and the skin cleansed and bandaged. No immediate complications were observed. IMPRESSION: 1. Technically successful fluoroscopically guided left elbow arthrocentesis yielding about 4 cc of serosanguineous fluid, sent for laboratory analysis. Electronically Signed   By: Van Clines M.D.   On: 12/31/2020 16:07   ECHOCARDIOGRAM LIMITED  Result Date: 12/31/2020    ECHOCARDIOGRAM LIMITED REPORT   Patient Name:   David [name]. Date of Exam: 12/31/2020 Medical Rec #:  [id number]              Height:       68.0 in Accession #:    [id number]             Weight:       202.2 lb Date of Birth:  Apr 17, 1940              BSA:  2.053 m Patient Age:    8 years               BP:           140/79 mmHg Patient Gender: M                      HR:           89 bpm. Exam Location:  Inpatient Procedure: Limited Echo, Limited Color Doppler and Cardiac Doppler  Indications:    bacteremia  History:        Patient has prior history of Echocardiogram examinations, most                 recent 12/24/2020. Defibrillator and Pacemaker, chronic kidney                 disease, Signs/Symptoms:Fever; Risk Factors:Hypertension,                 Dyslipidemia and Sleep Apnea.  Sonographer:    Johny Chess RDCS Referring Phys: [place]  Sonographer Comments: No apical window, Technically difficult study due to poor echo windows and Technically challenging study due to limited acoustic windows. Image acquisition challenging due to uncooperative patient and Image acquisition challenging due to respiratory motion. IMPRESSIONS  1. Left ventricular ejection fraction, by estimation, is 55 to 60%. The left ventricle has normal function.  2. The mitral valve is normal in structure. No evidence of mitral valve regurgitation. No evidence of mitral stenosis.  3. There is mild calcification of the aortic valve. There is mild thickening of the aortic valve.  4. The inferior vena cava is normal in size with greater than 50% respiratory variability, suggesting right atrial pressure of 3 mmHg. Comparison(s): Prior images reviewed side by side. Conclusion(s)/Recommendation(s): Limited study. Difficult imaging windows. There are no apparent vegetations or severe valve stenosis/regurgitation, and no vegetation seen on pacemaker lead. However, given images, would consider TEE for definitive evaluation if clinically indicated. FINDINGS  Left Ventricle: Left ventricular ejection fraction, by estimation, is 55 to 60%. The left ventricle has normal function. Mitral Valve: The mitral valve is normal in structure. No evidence of mitral valve stenosis. Tricuspid Valve: The tricuspid valve is normal in structure. Tricuspid valve regurgitation is trivial. No evidence of tricuspid stenosis. Aortic Valve: There is mild calcification of the aortic valve. There is mild thickening of the aortic valve.  Pulmonic Valve: The pulmonic valve was not well visualized. Venous: The inferior vena cava is normal in size with greater than 50% respiratory variability, suggesting right atrial pressure of 3 mmHg. Additional Comments: A device lead is visualized. LEFT VENTRICLE PLAX 2D LVIDd:         4.40 cm LVIDs:         2.70 cm LV PW:         1.30 cm LV IVS:        1.10 cm LVOT diam:     2.50 cm LVOT Area:     4.91 cm  IVC IVC diam: 1.20 cm LEFT ATRIUM         Index LA diam:    3.70 cm 1.80 cm/m   AORTA Ao Root diam: 3.50 cm  SHUNTS Systemic Diam: 2.50 cm Buford Dresser MD Electronically signed by Buford Dresser MD Signature Date/Time: 12/31/2020/5:56:56 PM    Final      LOS: 15 days   Signature  Lala Lund M.D on 01/02/2021 at 10:22 AM   -  To page go to [email or web address]

## 2021-01-02 NOTE — Progress Notes (Signed)
Palliative Care Consult Note  80 year old man with stents of past medical history including prior craniotomy in 2017 for metastatic melanoma, secondary development of acute promyelocytic leukemia, hemorrhagic CVA and coronary artery disease who until recently had maintained an acceptable level of functional status and quality of life, despite his many medical problems.  He however developed altered mental status, aphasia and strokelike symptoms and was transferred to [facility] for treatment of intractable seizures and he has not recovered his neurological status and continues to have complicated encephalopathy.  He has now developed a cough and symptoms consistent with pneumonia.  Mr. Goldsborough remains intermittently responsive, he is unable to maintain his a alertness and focus but is able to answer, inconsistently basic yes/no responses.  Family is at bedside this morning and I met with to discuss options for transitioning to comfort care.  He is now hospital day 15 without significant or sustained improvement.  At this time the clinical team feels he has neurological decline and that he will not recover meaningful quality of life which for Mr. Schloemer means maintaining his independence, able to communicate with his loved ones and not having pain or other distressing symptoms.  Recommendations:  Met with patient's daughter and son who are the primary decision makers.  At this time we will transition him to comfort care.  I discussed this transition with him in detail including removal of his core track feeding tube, discontinuation of antibiotics, removal of telemetry, removal of any nonessential medications. Aggressive symptom management will be required.  Goals are to treat his pain and symptoms of distress at end-of-life and to not prolong his life under the circumstances. Discontinued core track feeding tube. For his symptom management I recommend the following As needed IV hydromorphone, may need  to consider starting a low rate continuous infusion. Maintain Vimpat.  We will use as needed Valium to control any signs of seizure activity.,  We may need to schedule a benzodiazepine.  We will start scheduled medication once family has visited and we have stable IV access. Requested midline catheter placement for comfort meds.  He currently does not have IV access. In terms of his disposition, given his fragile nature and his seizure risk, I do not believe he is stable enough to transfer out of the hospital at this point.  Requested a transfer to a medical surgical unit 6N., so that family can have more space and this will also reflect a more appropriate level of care.  Lane Hacker, DO Palliative Medicine   35 min Greater than 50%  of this time was spent counseling and coordinating care related to the above assessment and plan.

## 2021-01-03 LAB — CULTURE, BLOOD (ROUTINE X 2)
Culture: NO GROWTH
Special Requests: ADEQUATE

## 2021-01-03 LAB — GLUCOSE, CAPILLARY: Glucose-Capillary: 107 mg/dL — ABNORMAL HIGH (ref 70–99)

## 2021-01-03 MED ORDER — DIAZEPAM 5 MG/ML IJ SOLN
5.0000 mg | Freq: Four times a day (QID) | INTRAMUSCULAR | Status: DC
  Administered 2021-01-03 – 2021-01-04 (×3): 5 mg via INTRAVENOUS
  Filled 2021-01-03 (×3): qty 2

## 2021-01-03 MED ORDER — SODIUM CHLORIDE 0.9 % IV SOLN
2.0000 mg/h | INTRAVENOUS | Status: DC
  Administered 2021-01-03: 0.5 mg/h via INTRAVENOUS
  Filled 2021-01-03: qty 2.5

## 2021-01-03 MED ORDER — SODIUM CHLORIDE 0.9 % IV SOLN
0.5000 mg/h | INTRAVENOUS | Status: DC
  Filled 2021-01-03: qty 2.5

## 2021-01-03 MED ORDER — HYDROMORPHONE BOLUS VIA INFUSION
1.0000 mg | INTRAVENOUS | Status: DC | PRN
  Administered 2021-01-03 – 2021-01-04 (×2): 1 mg via INTRAVENOUS
  Filled 2021-01-03: qty 1

## 2021-01-03 NOTE — Progress Notes (Signed)
This chaplain responded to PMT consult for spiritual care.  The chaplain understands the Pt. was transitioned to full comfort care on 9/25.  The chaplain is present at the bedside with the Pt. RN-Melissa, Pt. son-[place] and sister-Lennie, along with friends.  The chaplain observed the Pt. move his head toward the speaker when spoken to.   The chaplain listened reflectively to [place] as they connected the chaplain to the Pt. legacy in the community. 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 skill and love of the game of golf is a significant part of the storytelling.   The chaplain understands the family is open to F/U spiritual care and has clergy from the community praying and supporting the family.  Chaplain Sallyanne Kuster Pager [phone]

## 2021-01-03 NOTE — Progress Notes (Signed)
PROGRESS NOTE        PATIENT DETAILS Name: David [name]. Age: 80 y.o. Sex: male Date of Birth: 1941/04/08 Admit Date: 12/28/2020 Admitting Physician No admitting provider for patient encounter. NIO:[id number], [name], MD  Brief Narrative:  Patient is a 80 y.o. male with history of metastatic melanoma with bone/lung/brain mets-s/p brain radiation/left frontal craniotomy and tumor excision (July 2017), acute promyelocytic leukemia, hemorrhagic CVA, HTN, CAD s/p CABG 2014, AV block-s/p PPM placement, OSA on CPAP, depression who presented to [facility] with aphasia-found to have EEG evidence of seizures originating from the left temporal region-subsequently transferred to [facility] for LTM EEG.  Patient was felt to have intractable seizures-and was started on multiple antiepileptics (Onfi, Depakote, Keppra, Vimpat) subsequent hospital course complicated by encephalopathy (felt to be due to Onfi/seizure meds/AKI ), fever (likely aspiration pneumonitis).  Patient showed minimal clinical progress despite appropriate care, pall.care was involved, he was transitioned to Full comfort care on 01/02/21.    Subjective - patient in bed appears comfortable, somnolent, under full comfort measures.   Objective:  Vitals:  Blood pressure 92/66, pulse 92, temperature 98.1 F (36.7 C), temperature source Oral, resp. rate 16, height 5\' 8"  (1.727 m), weight 91.7 kg, SpO2 94 %.    Exam:  In bed appears comfortable, in no distress, unable to answer questions as he is now on full comfort measures and under the effect of narcotics   Lumbar puncture: 9/16>>CSF: WBC 3, protein 27 9/16>> CSF culture: No growth 9/16>>CSF HSV/Crypto Ag:neg  Stroke work-up: 9/16>> CT head: Right cerebellar CVA 9/16>> CTA head and neck: No LVO 9/16>> Echo: EF 55-60% 9/16>> LDL: 42 9/16>> A1c: 5.8 9/18: CT head: No acute abnormality-lesion seen on CT head on 9/16 in right cerebellar peduncle was likely  artifactual. 9/23>> Limited echo: No evidence of severe valve stenosis/regurgitation or vegetation seen on pacemaker lead.  Radiology: 9/21>> CT abdomen/pelvis: No acute abnormalities-mild infiltration in lung bases right> left 9/22>> CT left elbow: Small joint effusion. 9/22>> B/L lower extremity Doppler: No DVT 9/22>> left upper extremity Doppler: Chronic superficial vein thrombosis involving the left cephalic vein.  Microbiology: 9/14>>Blood culture: 1/2-staph epidermidis 9/16>> blood culture: No growth 9/21>> blood culture: 1/2 staph epidermidis. 9/21>> urine culture: No growth 9/21>> COVID PCR: Negative 9/21: Urine culture: No growth 9/22>> blood culture: No growth 9/23>> left elbow synovial culture: No growth  Pathology: 9/16>>CSF cytology: No malignant cells identified.   Procedures : 9/16>> lumbar puncture by IR 9/23>> left elbow arthrocentesis by IR  Consults:Neurology, palliative care DVT Prophylaxis :Prophylactic Lovenox Code Status:DNR Family Communication:Daughter-[name] [phone] bedside - 01/02/21, 01/03/21  Disposition Plan: Status is: Inpatient  Remains inpatient appropriate because:Inpatient level of care appropriate due to severity of illness  Dispo: The patient is from: Home              Anticipated d/c is to: SNF              Patient currently is not medically stable to d/c.   Difficult to place patient No    Barriers to Discharge: Ongoing encephalopathy-not yet at baseline-not yet stable for discharge.   Time spent:  15 mins     Assessment/Plan:   Patient is now full comfort measures, some seizure medications along with comfort medications only on board, goal of care is comfort only, he is DNR and family is  on board with the plan.   Other medical issues addressed earlier during this admission are below  New refractory focal seizures: No further seizures on LTM EEG (last EEG seizure on 9/15).remains on Vimpat and  Depakote.  Onfi/Keppra discontinued given severe encephalopathy.  LTM EEG discontinued on 9/21.  Neurology following, case discussed with neurologist Dr. Shona Needles on 01/02/2021, poor prognosis, continue current seizure medications, supportive care.    Acute toxic metabolic encephalopathy: Thought to be due to seizure medications (particularly Onfi which is a long half-life) in the setting of AKI/hypernatremia (significantly improved).  Mental status has definitely improved over the past few days-although lethargic he is much more awake and alert answering a few questions.  Encephalopathy likely due to combination of underlying encephalomalacia from previous metastatic melanoma brain surgery, prolonged seizures and Onfi being on board.  Although encephalopathy is mildly improved long-term prognosis remains extremely guarded.  Right-sided hemiplegia with underlying history of severe left frontal lobe encephalomalacia due to metastatic melanoma surgery along with nontraumatic intracranial hemorrhage in the past: Patient has had a long hospital stay with prolonged encephalopathy-on 9/16-CT head showed a possible right cerebellar stroke-however repeat CT on 9/18 did not show stroke-after discussion with neurology it was felt that CT findings on 9/16 was an artifact.  Since his encephalopathy has markedly improved-and he is more awake today than the past few days-he appears to have a significant right-sided hemiplegia.  Long discussion with Dr. Rory Percy today-suspicion is for worsening encephalomalacia in the setting of focal status epilepticus likely the culprit for right-sided hemiplegia or he has had a small CVA not seen on imaging.  Unable to do a MRI brain given that he has a PPM in place.  It is felt that obtaining further neuroimaging with no change management outcome.  Also had a long discussion with patient's daughter/son at bedside-patient would not want to live with a PEG tube-now would he want to live with  significant amount of right-sided hemiplegia.  Family discussion in progress-Dr. Hilma Favors from palliative care engaging family-for now family has decided to continue home medications via NG tube but stop the feeding as of night of 01/01/2021.  Continue supportive care.  Long-term prognosis appears poor.  ?Acute right cerebellar CVA: CT head on 9/16 showed possible right cerebellar stroke-however repeat CT head on 9/18 did not show stroke-it is now felt that lesion on the right cerebellar lobe on 9/16 was a artifact.  CVA ruled out.  Back on aspirin-no longer on plavix.  PPM interrogation completed-no A. fib detected.  See above regarding finding of right-sided hemiplegia-noted encephalopathy has started to improve.  Recurrent fever ( on 9/13-and then on 9/20): Extensive work-up completed-see above-not felt to have PPM lead infection at this point-suspicion is for ongoing aspiration pneumonitis.  No evidence of septic arthritis in the left elbow-LUE swelling is likely due to superficial vein thrombosis seen on Doppler ultrasound.  B/L lower extremity Dopplers were negative for DVT.  ID has narrowed down antimicrobial therapy to Augmentin x3 days.    Coag negative staph bacteremia (1/2 blood culture on 9/14, and 1/2 on 9/21): Has PPM in place-concern that this may be a real infection-and pacemaker leads could have gotten seeded.  Limited echo on 9/23 without any evidence of valve or pacemaker lead vegetation.  Per ID-MRSE profiles on culture on 9/14 and 9/21 are different species--unlikely pathogenic.  Very low suspicion for pacemaker lead infection at this point-given his overall clinical state-he is too frail to tolerate sedation for a TEE.  AKI on CKD stage IIIb: AKI due to contrast nephropathy-improved-and back to baseline.  Avoid nephrotoxic agents.Per daughter-Dr Leggett-patient has thin basement membrane disease and has chronic microscopic hematuria  Acute urinary retention-on 9/18-failed a voiding  trial-Foley catheter reinserted on 9/22: Continue Flomax-we will need to attempt a voiding trial again when he is a bit more awake alert and less debilitated.  Hypernatremia: Secondary to dehydration (no oral intake due to encephalopathy)-continue free water flushes via PEG tube.  Sodium levels have normalized.  Hyperkalemia.  Lokelma 3 doses via NG tube on 01/02/2021.    Hyperglycemia: Likely due to tube feedings-no evidence of diabetes.  CBGs stabilized-continue Levemir 10 units twice daily and SSI.    Recent Labs    01/02/21 0740 01/02/21 1123 01/03/21 0803  GLUCAP 106* 132* 107*   HTN: BP relatively stable-on metoprolol-if blood pressure continues to be on the higher side-May need to resume amlodipine.  Reassess 9/21.  History of CAD s/p CABG in 2014: Remains stable-continue aspirin/beta-blocker.    History of complete heart block-PPM in place  History of hemiplegia and hemiparesis following nontraumatic intracranial hemorrhage affecting right dominant side  History of metastatic melanoma with metastasis to the lingula, right upper lung, supraventricular clavicular lymph node, left frontal lobe metastasis-s/p resection: Resume outpatient follow-up with oncology/neurooncology.    History of acute promyelocytic leukemia: Follow CBC periodically--in remission-stable for outpatient follow-up with primary oncologist.  Major depressive disorder: Trazodone/Zoloft held due to encephalopathy-resume over the next few days once mentation improves further.  Hypothyroidism: Continue levothyroxine.  Recent TSH in May was within normal limits.   OSA:on CPAP-on hold as NG tube is in place.  Nutrition: Cortak tube placed on 9/16-feedings ongoing.  Will need SLP reevaluation once encephalopathy has improved before discontinuing cortak.  Functional quadriplegia/severe debility/deconditioning: Normally able to ambulate with the help of walker-current significant debility is from acute illness.   Once encephalopathy resolves-hopeful that we can start PT/OT.  Palliative care: DNR in place.  Multiple discussions with patient's daughter Silas [place] over the past few days-again today-given the fact the patient now appears to have significant right-sided hemiplegia-we did have a frank discussion regarding perhaps initiating comfort measures as it was felt likely that patient will have poor quality of life going forward.  I reached out to Dr. Kara Dies care-she has had prior conversations with the family in the distant past.  Dr. Hilma Favors will continue to have end-of-life discussion with family and provide [place] with further recommendations.  Obesity: Estimated body mass index is 30.74 kg/m as calculated from the following:   Height as of this encounter: 5\' 8"  (1.727 m).   Weight as of this encounter: 91.7 kg.      MEDICATIONS: Scheduled Meds:  Chlorhexidine Gluconate Cloth  6 each Topical Daily   sodium chloride flush  10-40 mL Intracatheter Q12H   Continuous Infusions:  sodium chloride 10 mL/hr at 01/01/21 2330   lacosamide (VIMPAT) IV 150 mg (01/02/21 1826)   PRN Meds:.acetaminophen **OR** acetaminophen, albuterol, diazepam, glycopyrrolate, HYDROmorphone (DILAUDID) injection, midazolam, ondansetron **OR** ondansetron (ZOFRAN) IV, polyvinyl alcohol   LABORATORY DATA:  Recent Labs  Lab 12/29/20 0331 12/30/20 0305 12/31/20 0351 01/01/21 0605 01/02/21 0459  WBC 8.6 8.9 8.6 8.8 10.9*  HGB 15.0 13.8 13.8 13.7 15.8  HCT 45.4 42.7 41.1 42.2 48.4  PLT 164 151 178 213 234  MCV 95.6 96.8 95.4 95.3 95.1  MCH 31.6 31.3 32.0 30.9 31.0  MCHC 33.0 32.3 33.6 32.5 32.6  RDW 13.9 13.9 13.8 13.8 13.8  Recent Labs  Lab 12/29/20 0331 12/29/20 0723 12/30/20 0305 12/31/20 0351 01/01/21 0605 01/02/21 0459  NA 145  --  138 136 141 141  K 4.5  --  4.4 4.7 4.8 5.6*  CL 110  --  104 103 106 106  CO2 26  --  24 25 27 22   GLUCOSE 191*  --  230* 218* 158* 93  BUN 50*  --  53*  55* 54* 52*  CREATININE 1.83*  --  2.20* 2.18* 1.88* 1.88*  CALCIUM 8.3*  --  7.9* 7.7* 8.4* 8.8*  AST 20  --  19 23 28  36  ALT 14  --  14 16 20 17   ALKPHOS 40  --  41 42 44 45  BILITOT 0.3  --  0.9 0.9 0.6 QUANTITY NOT SUFFICIENT, UNABLE TO PERFORM TEST  ALBUMIN 2.2*  --  1.9* 1.9* 2.0* 2.1*  PROCALCITON  --  0.13  --   --   --   --   AMMONIA 45*  --  25 36*  --   --      RADIOLOGY STUDIES/RESULTS: No results found.   LOS: 16 days   Signature  Lala Lund M.D on 01/03/2021 at 9:26 AM   -  To page go to [email or web address]

## 2021-01-03 NOTE — Plan of Care (Signed)
Patient is on end of life comfort care.   Problem: Education: Goal: Knowledge of General Education information will improve Description: Including pain rating scale, medication(s)/side effects and non-pharmacologic comfort measures Outcome: Not Applicable   Problem: Health Behavior/Discharge Planning: Goal: Ability to manage health-related needs will improve Outcome: Not Applicable   Problem: Clinical Measurements: Goal: Ability to maintain clinical measurements within normal limits will improve Outcome: Not Applicable Goal: Will remain free from infection Outcome: Not Applicable Goal: Diagnostic test results will improve Outcome: Not Applicable Goal: Respiratory complications will improve Outcome: Not Applicable Goal: Cardiovascular complication will be avoided Outcome: Not Applicable   Problem: Activity: Goal: Risk for activity intolerance will decrease Outcome: Not Applicable   Problem: Nutrition: Goal: Adequate nutrition will be maintained Outcome: Not Applicable   Problem: Coping: Goal: Level of anxiety will decrease Outcome: Not Applicable   Problem: Elimination: Goal: Will not experience complications related to bowel motility Outcome: Not Applicable Goal: Will not experience complications related to urinary retention Outcome: Not Applicable   Problem: Pain Managment: Goal: General experience of comfort will improve Outcome: Not Applicable   Problem: Safety: Goal: Ability to remain free from injury will improve Outcome: Not Applicable

## 2021-01-04 LAB — CULTURE, BLOOD (ROUTINE X 2)
Culture: NO GROWTH
Culture: NO GROWTH
Special Requests: ADEQUATE
Special Requests: ADEQUATE

## 2021-01-04 MED ORDER — DIAZEPAM 5 MG/ML IJ SOLN
5.0000 mg | INTRAMUSCULAR | Status: DC
  Administered 2021-01-04 (×2): 5 mg via INTRAVENOUS
  Filled 2021-01-04 (×2): qty 2

## 2021-01-04 MED ORDER — HYDROMORPHONE BOLUS VIA INFUSION
1.0000 mg | INTRAVENOUS | Status: DC | PRN
Start: 2021-01-04 — End: 2021-01-04
  Administered 2021-01-04 (×2): 1 mg via INTRAVENOUS
  Filled 2021-01-04: qty 1

## 2021-01-05 LAB — AEROBIC/ANAEROBIC CULTURE W GRAM STAIN (SURGICAL/DEEP WOUND): Culture: NO GROWTH

## 2021-01-06 LAB — MISC LABCORP TEST (SEND OUT): Labcorp test code: 790500

## 2021-01-07 ENCOUNTER — Telehealth: Payer: Self-pay | Admitting: Family Medicine

## 2021-01-07 NOTE — Telephone Encounter (Signed)
Spoke with [name] to express my condolences.

## 2021-01-08 NOTE — Death Summary Note (Signed)
[facility] Death Note                                                                                                                                                                                               David [name], is a 80 y.o. male, DOB - 09-23-1940, GGY:[id number]  Admit date - 12/24/2020   Admitting Physician No admitting provider for patient encounter.  Outpatient Primary MD for the patient is [name], MD  LOS - 17  CC - seizures     Notification: [name], MD notified of death of 20-Jan-2021   Date and Time of Death - January 20, 2021 at 15:10  Pronounced by - RN, Dr Hilma Favors  History of present illness:   David [name]. is a 80 y.o. male with a history of - metastatic melanoma with bone/lung/brain mets-s/p brain radiation/left frontal craniotomy and tumor excision (July 2017), acute promyelocytic leukemia, hemorrhagic CVA, HTN, CAD s/p CABG 2014, AV block-s/p PPM placement, OSA on CPAP, depression who presented to [facility] with aphasia-found to have EEG evidence of seizures originating from the left temporal region-subsequently transferred to [facility] for LTM EEG.  Patient was felt to have intractable seizures-and was started on multiple antiepileptics (Onfi, Depakote, Keppra, Vimpat) subsequent hospital course complicated by encephalopathy (felt to be due to Onfi/seizure meds/AKI ), fever (likely aspiration pneumonitis), subsequently also developed R. Sided weakness as well.  Patient showed minimal clinical progress despite appropriate care, pall.care was involved, he was transitioned to Full comfort care on 18-Jan-2021 and passed away in comfort on 2021/01/20.   Final Diagnoses:  Cause if death - Seizures, AMS.  Signature  Lala Lund M.D on Jan 20, 2021 at 4:37 PM  [facility]  Office Phone -[phone]  Total clinical and documentation time for today Under 30  minutes   Last Note                          PROGRESS NOTE        PATIENT DETAILS Name: David [name]. Age: 80 y.o. Sex: male Date of Birth: 1940-11-25 Admit Date: 12/30/2020 Admitting Physician No admitting provider for patient encounter. EXH:[email or web address], [name], MD  Brief Narrative:  Patient is a 80 y.o. male with history of metastatic melanoma with bone/lung/brain mets-s/p brain radiation/left frontal craniotomy and tumor excision (July 2017), acute promyelocytic leukemia, hemorrhagic CVA, HTN, CAD s/p CABG 2014, AV block-s/p PPM placement, OSA on CPAP, depression who presented to [facility] with aphasia-found to have EEG evidence of seizures originating from the left temporal region-subsequently transferred to [facility] for LTM EEG.  Patient was felt  to have intractable seizures-and was started on multiple antiepileptics (Onfi, Depakote, Keppra, Vimpat) subsequent hospital course complicated by encephalopathy (felt to be due to Onfi/seizure meds/AKI ), fever (likely aspiration pneumonitis).  Patient showed minimal clinical progress despite appropriate care, pall.care was involved, he was transitioned to Full comfort care on 01/02/21.    Subjective - patient in bed appears comfortable, somnolent, under full comfort measures.   Objective:  Vitals:  Blood pressure 119/89, pulse (!) 104, temperature 99.5 F (37.5 C), temperature source Axillary, resp. rate 17, height 5\' 8"  (1.727 m), weight 91.7 kg, SpO2 (!) 88 %.    Exam:  In bed appears comfortable, in no distress, unable to answer questions as he is now on full comfort measures and under the effect of narcotics, death looks imminent now.   Lumbar puncture: 9/16>>CSF: WBC 3, protein 27 9/16>> CSF culture: No growth 9/16>>CSF HSV/Crypto Ag:neg  Stroke work-up: 9/16>> CT head: Right cerebellar CVA 9/16>> CTA head and neck: No LVO 9/16>> Echo: EF 55-60% 9/16>> LDL: 42 9/16>> A1c: 5.8 9/18: CT head: No acute  abnormality-lesion seen on CT head on 9/16 in right cerebellar peduncle was likely artifactual. 9/23>> Limited echo: No evidence of severe valve stenosis/regurgitation or vegetation seen on pacemaker lead.  Radiology: 9/21>> CT abdomen/pelvis: No acute abnormalities-mild infiltration in lung bases right> left 9/22>> CT left elbow: Small joint effusion. 9/22>> B/L lower extremity Doppler: No DVT 9/22>> left upper extremity Doppler: Chronic superficial vein thrombosis involving the left cephalic vein.  Microbiology: 9/14>>Blood culture: 1/2-staph epidermidis 9/16>> blood culture: No growth 9/21>> blood culture: 1/2 staph epidermidis. 9/21>> urine culture: No growth 9/21>> COVID PCR: Negative 9/21: Urine culture: No growth 9/22>> blood culture: No growth 9/23>> left elbow synovial culture: No growth  Pathology: 9/16>>CSF cytology: No malignant cells identified.   Procedures : 9/16>> lumbar puncture by IR 9/23>> left elbow arthrocentesis by IR  Consults:Neurology, palliative care DVT Prophylaxis :Prophylactic Lovenox Code Status:DNR Family Communication:Daughter-[name] [phone] bedside - 01/02/21, 01/03/21, son bedside 2021-01-13  Disposition Plan: Status is: Inpatient  Remains inpatient appropriate because:Inpatient level of care appropriate due to severity of illness  Dispo: The patient is from: Home              Anticipated d/c is to: SNF              Patient currently is not medically stable to d/c.   Difficult to place patient No    Barriers to Discharge: Ongoing encephalopathy-not yet at baseline-not yet stable for discharge.   Time spent:  15 mins     Assessment/Plan:   Patient is now full comfort measures, some seizure medications along with comfort medications only on board, goal of care is comfort only, he is DNR and family is on board with the plan, death looks imminent .   Other medical issues addressed earlier during this admission are  below  New refractory focal seizures: No further seizures on LTM EEG (last EEG seizure on 9/15).remains on Vimpat and Depakote.  Onfi/Keppra discontinued given severe encephalopathy.  LTM EEG discontinued on 9/21.  Neurology following, case discussed with neurologist Dr. Shona Needles on 01/02/2021, poor prognosis, continue current seizure medications, supportive care.    Acute toxic metabolic encephalopathy: Thought to be due to seizure medications (particularly Onfi which is a long half-life) in the setting of AKI/hypernatremia (significantly improved).  Mental status has definitely improved over the past few days-although lethargic he is much more awake and alert answering a few questions.  Encephalopathy  likely due to combination of underlying encephalomalacia from previous metastatic melanoma brain surgery, prolonged seizures and Onfi being on board.  Although encephalopathy is mildly improved long-term prognosis remains extremely guarded.  Right-sided hemiplegia with underlying history of severe left frontal lobe encephalomalacia due to metastatic melanoma surgery along with nontraumatic intracranial hemorrhage in the past: Patient has had a long hospital stay with prolonged encephalopathy-on 9/16-CT head showed a possible right cerebellar stroke-however repeat CT on 9/18 did not show stroke-after discussion with neurology it was felt that CT findings on 9/16 was an artifact.  Since his encephalopathy has markedly improved-and he is more awake today than the past few days-he appears to have a significant right-sided hemiplegia.  Long discussion with Dr. Rory Percy today-suspicion is for worsening encephalomalacia in the setting of focal status epilepticus likely the culprit for right-sided hemiplegia or he has had a small CVA not seen on imaging.  Unable to do a MRI brain given that he has a PPM in place.  It is felt that obtaining further neuroimaging with no change management outcome.  Also had a long discussion  with patient's daughter/son at bedside-patient would not want to live with a PEG tube-now would he want to live with significant amount of right-sided hemiplegia.  Family discussion in progress-Dr. Hilma Favors from palliative care engaging family-for now family has decided to continue home medications via NG tube but stop the feeding as of night of 01/01/2021.  Continue supportive care.  Long-term prognosis appears poor.  ?Acute right cerebellar CVA: CT head on 9/16 showed possible right cerebellar stroke-however repeat CT head on 9/18 did not show stroke-it is now felt that lesion on the right cerebellar lobe on 9/16 was a artifact.  CVA ruled out.  Back on aspirin-no longer on plavix.  PPM interrogation completed-no A. fib detected.  See above regarding finding of right-sided hemiplegia-noted encephalopathy has started to improve.  Recurrent fever ( on 9/13-and then on 9/20): Extensive work-up completed-see above-not felt to have PPM lead infection at this point-suspicion is for ongoing aspiration pneumonitis.  No evidence of septic arthritis in the left elbow-LUE swelling is likely due to superficial vein thrombosis seen on Doppler ultrasound.  B/L lower extremity Dopplers were negative for DVT.  ID has narrowed down antimicrobial therapy to Augmentin x3 days.    Coag negative staph bacteremia (1/2 blood culture on 9/14, and 1/2 on 9/21): Has PPM in place-concern that this may be a real infection-and pacemaker leads could have gotten seeded.  Limited echo on 9/23 without any evidence of valve or pacemaker lead vegetation.  Per ID-MRSE profiles on culture on 9/14 and 9/21 are different species--unlikely pathogenic.  Very low suspicion for pacemaker lead infection at this point-given his overall clinical state-he is too frail to tolerate sedation for a TEE.  AKI on CKD stage IIIb: AKI due to contrast nephropathy-improved-and back to baseline.  Avoid nephrotoxic agents.Per daughter-Dr Leggett-patient has thin  basement membrane disease and has chronic microscopic hematuria  Acute urinary retention-on 9/18-failed a voiding trial-Foley catheter reinserted on 9/22: Continue Flomax-we will need to attempt a voiding trial again when he is a bit more awake alert and less debilitated.  Hypernatremia: Secondary to dehydration (no oral intake due to encephalopathy)-continue free water flushes via PEG tube.  Sodium levels have normalized.  Hyperkalemia.  Lokelma 3 doses via NG tube on 01/02/2021.    Hyperglycemia: Likely due to tube feedings-no evidence of diabetes.  CBGs stabilized-continue Levemir 10 units twice daily and SSI.  Recent Labs    01/02/21 0740 01/02/21 1123 01/03/21 0803  GLUCAP 106* 132* 107*   HTN: BP relatively stable-on metoprolol-if blood pressure continues to be on the higher side-May need to resume amlodipine.  Reassess 9/21.  History of CAD s/p CABG in 2014: Remains stable-continue aspirin/beta-blocker.    History of complete heart block-PPM in place  History of hemiplegia and hemiparesis following nontraumatic intracranial hemorrhage affecting right dominant side  History of metastatic melanoma with metastasis to the lingula, right upper lung, supraventricular clavicular lymph node, left frontal lobe metastasis-s/p resection: Resume outpatient follow-up with oncology/neurooncology.    History of acute promyelocytic leukemia: Follow CBC periodically--in remission-stable for outpatient follow-up with primary oncologist.  Major depressive disorder: Trazodone/Zoloft held due to encephalopathy-resume over the next few days once mentation improves further.  Hypothyroidism: Continue levothyroxine.  Recent TSH in May was within normal limits.   OSA:on CPAP-on hold as NG tube is in place.  Nutrition: Cortak tube placed on 9/16-feedings ongoing.  Will need SLP reevaluation once encephalopathy has improved before discontinuing cortak.  Functional quadriplegia/severe  debility/deconditioning: Normally able to ambulate with the help of walker-current significant debility is from acute illness.  Once encephalopathy resolves-hopeful that we can start PT/OT.  Palliative care: DNR in place.  Multiple discussions with patient's daughter Silas Sacramento over the past few days-again today-given the fact the patient now appears to have significant right-sided hemiplegia-we did have a frank discussion regarding perhaps initiating comfort measures as it was felt likely that patient will have poor quality of life going forward.  I reached out to Dr. Kara Dies care-she has had prior conversations with the family in the distant past.  Dr. Hilma Favors will continue to have end-of-life discussion with family and provide [place] with further recommendations.  Obesity: Estimated body mass index is 30.74 kg/m as calculated from the following:   Height as of this encounter: 5\' 8"  (1.727 m).   Weight as of this encounter: 91.7 kg.      MEDICATIONS: Scheduled Meds:  diazepam  5 mg Intravenous Q4H   sodium chloride flush  10-40 mL Intracatheter Q12H   Continuous Infusions:  sodium chloride 10 mL/hr at 01/01/21 2330   HYDROmorphone 2 mg/hr (17-Jan-2021 1333)   lacosamide (VIMPAT) IV 150 mg (01/17/21 1025)   PRN Meds:.acetaminophen **OR** acetaminophen, albuterol, glycopyrrolate, HYDROmorphone, midazolam, ondansetron **OR** ondansetron (ZOFRAN) IV, polyvinyl alcohol   LABORATORY DATA:  Recent Labs  Lab 12/29/20 0331 12/30/20 0305 12/31/20 0351 01/01/21 0605 01/02/21 0459  WBC 8.6 8.9 8.6 8.8 10.9*  HGB 15.0 13.8 13.8 13.7 15.8  HCT 45.4 42.7 41.1 42.2 48.4  PLT 164 151 178 213 234  MCV 95.6 96.8 95.4 95.3 95.1  MCH 31.6 31.3 32.0 30.9 31.0  MCHC 33.0 32.3 33.6 32.5 32.6  RDW 13.9 13.9 13.8 13.8 13.8    Recent Labs  Lab 12/29/20 0331 12/29/20 0723 12/30/20 0305 12/31/20 0351 01/01/21 0605 01/02/21 0459  NA 145  --  138 136 141 141  K 4.5  --  4.4 4.7 4.8  5.6*  CL 110  --  104 103 106 106  CO2 26  --  24 25 27 22   GLUCOSE 191*  --  230* 218* 158* 93  BUN 50*  --  53* 55* 54* 52*  CREATININE 1.83*  --  2.20* 2.18* 1.88* 1.88*  CALCIUM 8.3*  --  7.9* 7.7* 8.4* 8.8*  AST 20  --  19 23 28  36  ALT 14  --  14 16 20  17  ALKPHOS 40  --  41 42 44 45  BILITOT 0.3  --  0.9 0.9 0.6 QUANTITY NOT SUFFICIENT, UNABLE TO PERFORM TEST  ALBUMIN 2.2*  --  1.9* 1.9* 2.0* 2.1*  PROCALCITON  --  0.13  --   --   --   --   AMMONIA 45*  --  25 36*  --   --      RADIOLOGY STUDIES/RESULTS: No results found.   LOS: 17 days   Signature  Lala Lund M.D on 2021-01-29 at 4:37 PM   -  To page go to [email or web address]

## 2021-01-08 NOTE — Progress Notes (Signed)
Palliative Care Progress Note  Patient approaching EOL.Immanent death. Prolonged periods of Apnea, increased pooling of oral secretions and upper airway congestion.  Increased hydromorphone to 1mg /hr and bolus to 1mg . Changed versed to every 4 hours.  His HR is at 75bpm-he is likely paced at 100%. If he continues to have prolonged immanent death we can consider temporarily deactivating the pacer as to not impede the physiology of the dying process.  Have discussed above plan of care with his family.  Lane Hacker, DO Palliative Medicine   Time: 35 min Greater than 50%  of this time was spent counseling and coordinating care related to the above assessment and plan.

## 2021-01-08 NOTE — Progress Notes (Signed)
This chaplain attempted a F/U spiritual care visit.  The chaplain was updated by the Pt. RN-Melissa, the Pt. passed earlier today and family has gone home.

## 2021-01-08 NOTE — Progress Notes (Signed)
Palliative Care Event Note  Family requested re-evaluation for EOL symptom escalation this afternoon. David [name] has decline throughout the day and remains quite uncomfortable appearing despite hydromorphone comfort care infusion titration and aggressive symptom management. He has had hours of agonal type breathing but has not progressed to clinical death. He is unresponsive, significantly mottled, diaphoretic and has lost muscle control of his eyelids and mouth. He has pooling or oral secretions and very loud airway congestion.  Family brought up issue of pacemaker and the possibility that it could be prolonging the dying process for him. We had an earlier conversation about deactivation. He has a biventricular pacer, his chart documentation shows he has an ICD but I have confirmed that he in fact does not have an ICD only pacer function. His last interrogation was 09/2020. Records indicate that the pacer was initially placed for 3rd degree heart block in 1998 - now has biventricular pacer [employer] device and is device dependent. Patient will likely not survive long enough to have complete deactivation initiated, unclear if his device is magnet resistant. Call placed to Dr. Caryl Comes to discuss options. As patient continued to suffer with prolonged immanent death, a decision was made to place magnet over his pacer. Family understood that this may or may not impact his pacer functional at all or may place pacer in asynchronous mode which would not completely deactivate the device. They understand that death will occur with or without an intervention with the pacer and that he is dying from his underlying illness. A magnet was placed over his pacer. He subsequently experienced terminal bradycardia, respirations ceased and he expired.  I was present at beside with the patient's son and daughter. I pronounced patient and provided grief support.  Time of Death: 15:10 Post-Mortem: [facility] Cause of  Death: Intractable Seizure secondary to Metastatic Melanoma with Previously resected brain metastasis  Time: 60 minutes Greater than 50%  of this time was spent counseling and coordinating care related to the above assessment and plan. Marland Kitchen

## 2021-01-08 NOTE — Plan of Care (Signed)
Pt has rested well all shift. No sxs of distress. Dilaudid 0.5mg /hr continues to infuse per right upper arm single lumen PICC. Pt also receives Valium IV every 6 hours. Pt is on Comfort Measures End of Life Care. Pt opens eyes once in awhile to familiar voices. Son at bedside overnight and daughter to come in this morning. Pt has a nonproductive cough periodically. No signs of seizures or seizure-like activity this shift.

## 2021-01-08 NOTE — Progress Notes (Signed)
PROGRESS NOTE        PATIENT DETAILS Name: David [name]. Age: 80 y.o. Sex: male Date of Birth: 24-Oct-1940 Admit Date: 01/01/2021 Admitting Physician No admitting provider for patient encounter. JIR:[email or web address], [name], MD  Brief Narrative:  Patient is a 80 y.o. male with history of metastatic melanoma with bone/lung/brain mets-s/p brain radiation/left frontal craniotomy and tumor excision (July 2017), acute promyelocytic leukemia, hemorrhagic CVA, HTN, CAD s/p CABG 2014, AV block-s/p PPM placement, OSA on CPAP, depression who presented to [facility] with aphasia-found to have EEG evidence of seizures originating from the left temporal region-subsequently transferred to [facility] for LTM EEG.  Patient was felt to have intractable seizures-and was started on multiple antiepileptics (Onfi, Depakote, Keppra, Vimpat) subsequent hospital course complicated by encephalopathy (felt to be due to Onfi/seizure meds/AKI ), fever (likely aspiration pneumonitis).  Patient showed minimal clinical progress despite appropriate care, pall.care was involved, he was transitioned to Full comfort care on 01/02/21.    Subjective - patient in bed appears comfortable, somnolent, under full comfort measures.   Objective:  Vitals:  Blood pressure 119/89, pulse (!) 104, temperature 99.5 F (37.5 C), temperature source Axillary, resp. rate 17, height 5\' 8"  (1.727 m), weight 91.7 kg, SpO2 (!) 88 %.    Exam:  In bed appears comfortable, in no distress, unable to answer questions as he is now on full comfort measures and under the effect of narcotics, death looks imminent now.   Lumbar puncture: 9/16>>CSF: WBC 3, protein 27 9/16>> CSF culture: No growth 9/16>>CSF HSV/Crypto Ag:neg  Stroke work-up: 9/16>> CT head: Right cerebellar CVA 9/16>> CTA head and neck: No LVO 9/16>> Echo: EF 55-60% 9/16>> LDL: 42 9/16>> A1c: 5.8 9/18: CT head: No acute abnormality-lesion seen on CT head on 9/16  in right cerebellar peduncle was likely artifactual. 9/23>> Limited echo: No evidence of severe valve stenosis/regurgitation or vegetation seen on pacemaker lead.  Radiology: 9/21>> CT abdomen/pelvis: No acute abnormalities-mild infiltration in lung bases right> left 9/22>> CT left elbow: Small joint effusion. 9/22>> B/L lower extremity Doppler: No DVT 9/22>> left upper extremity Doppler: Chronic superficial vein thrombosis involving the left cephalic vein.  Microbiology: 9/14>>Blood culture: 1/2-staph epidermidis 9/16>> blood culture: No growth 9/21>> blood culture: 1/2 staph epidermidis. 9/21>> urine culture: No growth 9/21>> COVID PCR: Negative 9/21: Urine culture: No growth 9/22>> blood culture: No growth 9/23>> left elbow synovial culture: No growth  Pathology: 9/16>>CSF cytology: No malignant cells identified.   Procedures : 9/16>> lumbar puncture by IR 9/23>> left elbow arthrocentesis by IR  Consults:Neurology, palliative care DVT Prophylaxis :Prophylactic Lovenox Code Status:DNR Family Communication:Daughter-[name] [phone] bedside - 01/02/21, 01/03/21, son bedside 01/22/2021  Disposition Plan: Status is: Inpatient  Remains inpatient appropriate because:Inpatient level of care appropriate due to severity of illness  Dispo: The patient is from: Home              Anticipated d/c is to: SNF              Patient currently is not medically stable to d/c.   Difficult to place patient No    Barriers to Discharge: Ongoing encephalopathy-not yet at baseline-not yet stable for discharge.   Time spent:  15 mins     Assessment/Plan:   Patient is now full comfort measures, some seizure medications along with comfort medications only on board, goal of care  is comfort only, he is DNR and family is on board with the plan, death looks imminent .   Other medical issues addressed earlier during this admission are below  New refractory focal seizures: No  further seizures on LTM EEG (last EEG seizure on 9/15).remains on Vimpat and Depakote.  Onfi/Keppra discontinued given severe encephalopathy.  LTM EEG discontinued on 9/21.  Neurology following, case discussed with neurologist Dr. Shona Needles on 01/02/2021, poor prognosis, continue current seizure medications, supportive care.    Acute toxic metabolic encephalopathy: Thought to be due to seizure medications (particularly Onfi which is a long half-life) in the setting of AKI/hypernatremia (significantly improved).  Mental status has definitely improved over the past few days-although lethargic he is much more awake and alert answering a few questions.  Encephalopathy likely due to combination of underlying encephalomalacia from previous metastatic melanoma brain surgery, prolonged seizures and Onfi being on board.  Although encephalopathy is mildly improved long-term prognosis remains extremely guarded.  Right-sided hemiplegia with underlying history of severe left frontal lobe encephalomalacia due to metastatic melanoma surgery along with nontraumatic intracranial hemorrhage in the past: Patient has had a long hospital stay with prolonged encephalopathy-on 9/16-CT head showed a possible right cerebellar stroke-however repeat CT on 9/18 did not show stroke-after discussion with neurology it was felt that CT findings on 9/16 was an artifact.  Since his encephalopathy has markedly improved-and he is more awake today than the past few days-he appears to have a significant right-sided hemiplegia.  Long discussion with Dr. Rory Percy today-suspicion is for worsening encephalomalacia in the setting of focal status epilepticus likely the culprit for right-sided hemiplegia or he has had a small CVA not seen on imaging.  Unable to do a MRI brain given that he has a PPM in place.  It is felt that obtaining further neuroimaging with no change management outcome.  Also had a long discussion with patient's daughter/son at  bedside-patient would not want to live with a PEG tube-now would he want to live with significant amount of right-sided hemiplegia.  Family discussion in progress-Dr. Hilma Favors from palliative care engaging family-for now family has decided to continue home medications via NG tube but stop the feeding as of night of 01/01/2021.  Continue supportive care.  Long-term prognosis appears poor.  ?Acute right cerebellar CVA: CT head on 9/16 showed possible right cerebellar stroke-however repeat CT head on 9/18 did not show stroke-it is now felt that lesion on the right cerebellar lobe on 9/16 was a artifact.  CVA ruled out.  Back on aspirin-no longer on plavix.  PPM interrogation completed-no A. fib detected.  See above regarding finding of right-sided hemiplegia-noted encephalopathy has started to improve.  Recurrent fever ( on 9/13-and then on 9/20): Extensive work-up completed-see above-not felt to have PPM lead infection at this point-suspicion is for ongoing aspiration pneumonitis.  No evidence of septic arthritis in the left elbow-LUE swelling is likely due to superficial vein thrombosis seen on Doppler ultrasound.  B/L lower extremity Dopplers were negative for DVT.  ID has narrowed down antimicrobial therapy to Augmentin x3 days.    Coag negative staph bacteremia (1/2 blood culture on 9/14, and 1/2 on 9/21): Has PPM in place-concern that this may be a real infection-and pacemaker leads could have gotten seeded.  Limited echo on 9/23 without any evidence of valve or pacemaker lead vegetation.  Per ID-MRSE profiles on culture on 9/14 and 9/21 are different species--unlikely pathogenic.  Very low suspicion for pacemaker lead infection at this point-given  his overall clinical state-he is too frail to tolerate sedation for a TEE.  AKI on CKD stage IIIb: AKI due to contrast nephropathy-improved-and back to baseline.  Avoid nephrotoxic agents.Per daughter-Dr Leggett-patient has thin basement membrane disease and has  chronic microscopic hematuria  Acute urinary retention-on 9/18-failed a voiding trial-Foley catheter reinserted on 9/22: Continue Flomax-we will need to attempt a voiding trial again when he is a bit more awake alert and less debilitated.  Hypernatremia: Secondary to dehydration (no oral intake due to encephalopathy)-continue free water flushes via PEG tube.  Sodium levels have normalized.  Hyperkalemia.  Lokelma 3 doses via NG tube on 01/02/2021.    Hyperglycemia: Likely due to tube feedings-no evidence of diabetes.  CBGs stabilized-continue Levemir 10 units twice daily and SSI.    Recent Labs    01/02/21 0740 01/02/21 1123 01/03/21 0803  GLUCAP 106* 132* 107*   HTN: BP relatively stable-on metoprolol-if blood pressure continues to be on the higher side-May need to resume amlodipine.  Reassess 9/21.  History of CAD s/p CABG in 2014: Remains stable-continue aspirin/beta-blocker.    History of complete heart block-PPM in place  History of hemiplegia and hemiparesis following nontraumatic intracranial hemorrhage affecting right dominant side  History of metastatic melanoma with metastasis to the lingula, right upper lung, supraventricular clavicular lymph node, left frontal lobe metastasis-s/p resection: Resume outpatient follow-up with oncology/neurooncology.    History of acute promyelocytic leukemia: Follow CBC periodically--in remission-stable for outpatient follow-up with primary oncologist.  Major depressive disorder: Trazodone/Zoloft held due to encephalopathy-resume over the next few days once mentation improves further.  Hypothyroidism: Continue levothyroxine.  Recent TSH in May was within normal limits.   OSA:on CPAP-on hold as NG tube is in place.  Nutrition: Cortak tube placed on 9/16-feedings ongoing.  Will need SLP reevaluation once encephalopathy has improved before discontinuing cortak.  Functional quadriplegia/severe debility/deconditioning: Normally able to  ambulate with the help of walker-current significant debility is from acute illness.  Once encephalopathy resolves-hopeful that we can start PT/OT.  Palliative care: DNR in place.  Multiple discussions with patient's daughter Silas [place] over the past few days-again today-given the fact the patient now appears to have significant right-sided hemiplegia-we did have a frank discussion regarding perhaps initiating comfort measures as it was felt likely that patient will have poor quality of life going forward.  I reached out to Dr. Kara Dies care-she has had prior conversations with the family in the distant past.  Dr. Hilma Favors will continue to have end-of-life discussion with family and provide [place] with further recommendations.  Obesity: Estimated body mass index is 30.74 kg/m as calculated from the following:   Height as of this encounter: 5\' 8"  (1.727 m).   Weight as of this encounter: 91.7 kg.      MEDICATIONS: Scheduled Meds:  diazepam  5 mg Intravenous Q6H   sodium chloride flush  10-40 mL Intracatheter Q12H   Continuous Infusions:  sodium chloride 10 mL/hr at 01/01/21 2330   HYDROmorphone 0.5 mg/hr (01/03/21 1541)   lacosamide (VIMPAT) IV 150 mg (01/03/21 2249)   PRN Meds:.acetaminophen **OR** acetaminophen, albuterol, glycopyrrolate, HYDROmorphone, midazolam, ondansetron **OR** ondansetron (ZOFRAN) IV, polyvinyl alcohol   LABORATORY DATA:  Recent Labs  Lab 12/29/20 0331 12/30/20 0305 12/31/20 0351 01/01/21 0605 01/02/21 0459  WBC 8.6 8.9 8.6 8.8 10.9*  HGB 15.0 13.8 13.8 13.7 15.8  HCT 45.4 42.7 41.1 42.2 48.4  PLT 164 151 178 213 234  MCV 95.6 96.8 95.4 95.3 95.1  MCH 31.6 31.3 32.0  30.9 31.0  MCHC 33.0 32.3 33.6 32.5 32.6  RDW 13.9 13.9 13.8 13.8 13.8    Recent Labs  Lab 12/29/20 0331 12/29/20 0723 12/30/20 0305 12/31/20 0351 01/01/21 0605 01/02/21 0459  NA 145  --  138 136 141 141  K 4.5  --  4.4 4.7 4.8 5.6*  CL 110  --  104 103 106 106  CO2  26  --  24 25 27 22   GLUCOSE 191*  --  230* 218* 158* 93  BUN 50*  --  53* 55* 54* 52*  CREATININE 1.83*  --  2.20* 2.18* 1.88* 1.88*  CALCIUM 8.3*  --  7.9* 7.7* 8.4* 8.8*  AST 20  --  19 23 28  36  ALT 14  --  14 16 20 17   ALKPHOS 40  --  41 42 44 45  BILITOT 0.3  --  0.9 0.9 0.6 QUANTITY NOT SUFFICIENT, UNABLE TO PERFORM TEST  ALBUMIN 2.2*  --  1.9* 1.9* 2.0* 2.1*  PROCALCITON  --  0.13  --   --   --   --   AMMONIA 45*  --  25 36*  --   --      RADIOLOGY STUDIES/RESULTS: No results found.   LOS: 17 days   Signature  Lala Lund M.D on 2021/01/30 at 8:34 AM   -  To page go to [email or web address]

## 2021-01-08 NOTE — Progress Notes (Signed)
Palliative Care Progress Note  Mr. Pascucci was transitioned to comfort care yesterday. He has not received any PRN medications. He continues to be restlesss, periods of sleep and intermittent waking and jerking. Has a brow grimace. He has forceful periods of coughing. When he is able he denies pain, but clearly has pain especially when he is moved or hi let arm moves due to elbow bursitis. His lungs are congested. Significantly reduced air flow on the right side.   Discussed increased symptom management with the family including initiation of a hydromorphone infusion with bolus dosing for comfort. They agree to initiate infusion. Removed nasal cannula O2. No monitoring devices. Minimize painful interventions.Provide frequent oral care.  Provided acute symptom management at bedside including physician administered bolus dosing, total of additional 1 mg hydromorphone given and charted by RN. Additionally started scheduled Valium for jerking and restlessness.  Patient comfortable when I left the bedside. Provided grief support to the family present.  Anticipate hospital death within 24 hours.  Lane Hacker, DO Palliative Medicine   Time: 50 minutes Greater than 50%  of this time was spent counseling and coordinating care related to the above assessment and plan.

## 2021-01-08 DEATH — deceased

## 2021-01-12 LAB — DRUG SCREEN 10 W/CONF, SERUM
Amphetamines, IA: NEGATIVE ng/mL
Barbiturates, IA: NEGATIVE ug/mL
Benzodiazepines, IA: NEGATIVE ng/mL
Cocaine & Metabolite, IA: NEGATIVE ng/mL
Methadone, IA: NEGATIVE ng/mL
Opiates, IA: NEGATIVE ng/mL
Oxycodones, IA: NEGATIVE ng/mL
Phencyclidine, IA: NEGATIVE ng/mL
Propoxyphene, IA: NEGATIVE ng/mL
THC(Marijuana) Metabolite, IA: NEGATIVE ng/mL

## 2021-01-12 LAB — BENZODIAZEPINES,MS,WB/SP RFX
7-Aminoclonazepam: NEGATIVE ng/mL
Alprazolam: NEGATIVE ng/mL
Benzodiazepines Confirm: NEGATIVE
Chlordiazepoxide: NEGATIVE
Clonazepam: NEGATIVE ng/mL
Desalkylflurazepam: NEGATIVE ng/mL
Desmethylchlordiazepoxide: NEGATIVE
Desmethyldiazepam: NEGATIVE ng/mL
Diazepam: NEGATIVE ng/mL
Flurazepam: NEGATIVE ng/mL
Lorazepam: NEGATIVE ng/mL
Midazolam: NEGATIVE ng/mL
Oxazepam: NEGATIVE ng/mL
Temazepam: NEGATIVE ng/mL
Triazolam: NEGATIVE ng/mL

## 2021-03-10 ENCOUNTER — Other Ambulatory Visit: Payer: Medicare Other

## 2021-03-10 ENCOUNTER — Ambulatory Visit: Payer: Medicare Other | Admitting: Oncology

## 2021-04-07 ENCOUNTER — Ambulatory Visit: Payer: Medicare Other | Admitting: Podiatry

## 2021-08-25 ENCOUNTER — Other Ambulatory Visit: Payer: Medicare Other

## 2021-08-26 ENCOUNTER — Ambulatory Visit: Payer: Medicare Other

## 2021-09-02 ENCOUNTER — Encounter: Payer: Medicare Other | Admitting: Family Medicine

## 2021-11-21 ENCOUNTER — Other Ambulatory Visit: Payer: Medicare Other

## 2021-11-25 ENCOUNTER — Ambulatory Visit: Payer: Medicare Other | Admitting: Internal Medicine
